# Patient Record
Sex: Female | Born: 1935
Health system: Southern US, Community
[De-identification: ages and names within clinical notes are randomized; demographics above are authoritative.]

## PROBLEM LIST (undated history)

## (undated) DIAGNOSIS — T8859XA Other complications of anesthesia, initial encounter: Secondary | ICD-10-CM

## (undated) DIAGNOSIS — R0600 Dyspnea, unspecified: Secondary | ICD-10-CM

## (undated) DIAGNOSIS — L853 Xerosis cutis: Secondary | ICD-10-CM

## (undated) DIAGNOSIS — Z8489 Family history of other specified conditions: Secondary | ICD-10-CM

## (undated) DIAGNOSIS — M6281 Muscle weakness (generalized): Secondary | ICD-10-CM

## (undated) DIAGNOSIS — M549 Dorsalgia, unspecified: Secondary | ICD-10-CM

## (undated) DIAGNOSIS — Z8719 Personal history of other diseases of the digestive system: Secondary | ICD-10-CM

## (undated) DIAGNOSIS — H6123 Impacted cerumen, bilateral: Secondary | ICD-10-CM

## (undated) DIAGNOSIS — R011 Cardiac murmur, unspecified: Secondary | ICD-10-CM

## (undated) DIAGNOSIS — M1990 Unspecified osteoarthritis, unspecified site: Secondary | ICD-10-CM

## (undated) DIAGNOSIS — K859 Acute pancreatitis without necrosis or infection, unspecified: Secondary | ICD-10-CM

## (undated) DIAGNOSIS — N189 Chronic kidney disease, unspecified: Secondary | ICD-10-CM

## (undated) DIAGNOSIS — N390 Urinary tract infection, site not specified: Secondary | ICD-10-CM

## (undated) DIAGNOSIS — E7800 Pure hypercholesterolemia, unspecified: Secondary | ICD-10-CM

## (undated) DIAGNOSIS — D696 Thrombocytopenia, unspecified: Secondary | ICD-10-CM

## (undated) DIAGNOSIS — I5032 Chronic diastolic (congestive) heart failure: Secondary | ICD-10-CM

## (undated) DIAGNOSIS — T4145XA Adverse effect of unspecified anesthetic, initial encounter: Secondary | ICD-10-CM

## (undated) DIAGNOSIS — I809 Phlebitis and thrombophlebitis of unspecified site: Secondary | ICD-10-CM

## (undated) DIAGNOSIS — E119 Type 2 diabetes mellitus without complications: Secondary | ICD-10-CM

## (undated) DIAGNOSIS — M75101 Unspecified rotator cuff tear or rupture of right shoulder, not specified as traumatic: Secondary | ICD-10-CM

## (undated) DIAGNOSIS — G8929 Other chronic pain: Secondary | ICD-10-CM

## (undated) DIAGNOSIS — H04129 Dry eye syndrome of unspecified lacrimal gland: Secondary | ICD-10-CM

## (undated) DIAGNOSIS — M19019 Primary osteoarthritis, unspecified shoulder: Secondary | ICD-10-CM

## (undated) DIAGNOSIS — I10 Essential (primary) hypertension: Secondary | ICD-10-CM

## (undated) DIAGNOSIS — H9190 Unspecified hearing loss, unspecified ear: Secondary | ICD-10-CM

## (undated) DIAGNOSIS — K219 Gastro-esophageal reflux disease without esophagitis: Secondary | ICD-10-CM

## (undated) DIAGNOSIS — N3281 Overactive bladder: Secondary | ICD-10-CM

## (undated) DIAGNOSIS — M75100 Unspecified rotator cuff tear or rupture of unspecified shoulder, not specified as traumatic: Secondary | ICD-10-CM

## (undated) HISTORY — PX: BACK SURGERY: SHX140

## (undated) HISTORY — PX: CATARACT EXTRACTION W/ INTRAOCULAR LENS  IMPLANT, BILATERAL: SHX1307

## (undated) HISTORY — PX: TOTAL KNEE ARTHROPLASTY: SHX125

## (undated) HISTORY — PX: SHOULDER OPEN ROTATOR CUFF REPAIR: SHX2407

## (undated) HISTORY — PX: JOINT REPLACEMENT: SHX530

## (undated) HISTORY — PX: FIXATION KYPHOPLASTY: SHX860

## (undated) HISTORY — PX: ABDOMINAL HYSTERECTOMY: SHX81

---

## 1997-11-20 ENCOUNTER — Other Ambulatory Visit: Admission: RE | Admit: 1997-11-20 | Discharge: 1997-11-20 | Payer: Self-pay | Admitting: *Deleted

## 1997-12-10 ENCOUNTER — Other Ambulatory Visit: Admission: RE | Admit: 1997-12-10 | Discharge: 1997-12-10 | Payer: Self-pay | Admitting: Internal Medicine

## 1998-10-21 ENCOUNTER — Ambulatory Visit (HOSPITAL_COMMUNITY): Admission: RE | Admit: 1998-10-21 | Discharge: 1998-10-21 | Payer: Self-pay | Admitting: Rheumatology

## 1998-10-21 ENCOUNTER — Encounter: Payer: Self-pay | Admitting: Rheumatology

## 1999-08-25 ENCOUNTER — Encounter: Payer: Self-pay | Admitting: Orthopedic Surgery

## 1999-08-25 ENCOUNTER — Ambulatory Visit (HOSPITAL_COMMUNITY): Admission: RE | Admit: 1999-08-25 | Discharge: 1999-08-25 | Payer: Self-pay | Admitting: Orthopedic Surgery

## 1999-09-08 ENCOUNTER — Ambulatory Visit (HOSPITAL_COMMUNITY): Admission: RE | Admit: 1999-09-08 | Discharge: 1999-09-08 | Payer: Self-pay | Admitting: Orthopedic Surgery

## 1999-09-08 ENCOUNTER — Encounter: Payer: Self-pay | Admitting: Orthopedic Surgery

## 2000-03-12 ENCOUNTER — Encounter (INDEPENDENT_AMBULATORY_CARE_PROVIDER_SITE_OTHER): Payer: Self-pay

## 2000-03-12 ENCOUNTER — Ambulatory Visit (HOSPITAL_COMMUNITY): Admission: RE | Admit: 2000-03-12 | Discharge: 2000-03-12 | Payer: Self-pay | Admitting: Gastroenterology

## 2000-04-26 ENCOUNTER — Encounter: Admission: RE | Admit: 2000-04-26 | Discharge: 2000-04-26 | Payer: Self-pay | Admitting: Orthopedic Surgery

## 2000-04-26 ENCOUNTER — Encounter: Payer: Self-pay | Admitting: Orthopedic Surgery

## 2001-02-20 ENCOUNTER — Encounter: Payer: Self-pay | Admitting: Orthopedic Surgery

## 2001-02-20 ENCOUNTER — Ambulatory Visit (HOSPITAL_COMMUNITY): Admission: RE | Admit: 2001-02-20 | Discharge: 2001-02-20 | Payer: Self-pay | Admitting: Orthopedic Surgery

## 2001-09-10 ENCOUNTER — Emergency Department (HOSPITAL_COMMUNITY): Admission: EM | Admit: 2001-09-10 | Discharge: 2001-09-10 | Payer: Self-pay | Admitting: Emergency Medicine

## 2002-05-16 ENCOUNTER — Encounter: Admission: RE | Admit: 2002-05-16 | Discharge: 2002-05-16 | Payer: Self-pay | Admitting: Orthopaedic Surgery

## 2002-05-16 ENCOUNTER — Encounter: Payer: Self-pay | Admitting: Orthopaedic Surgery

## 2002-06-04 ENCOUNTER — Encounter: Payer: Self-pay | Admitting: Orthopedic Surgery

## 2002-06-09 ENCOUNTER — Encounter: Payer: Self-pay | Admitting: Orthopedic Surgery

## 2002-06-09 ENCOUNTER — Inpatient Hospital Stay (HOSPITAL_COMMUNITY): Admission: RE | Admit: 2002-06-09 | Discharge: 2002-06-12 | Payer: Self-pay | Admitting: Orthopedic Surgery

## 2002-07-22 ENCOUNTER — Encounter: Admission: RE | Admit: 2002-07-22 | Discharge: 2002-10-20 | Payer: Self-pay | Admitting: Orthopedic Surgery

## 2003-03-20 ENCOUNTER — Encounter: Payer: Self-pay | Admitting: Emergency Medicine

## 2003-03-20 ENCOUNTER — Emergency Department (HOSPITAL_COMMUNITY): Admission: EM | Admit: 2003-03-20 | Discharge: 2003-03-20 | Payer: Self-pay | Admitting: Emergency Medicine

## 2003-03-26 ENCOUNTER — Ambulatory Visit (HOSPITAL_COMMUNITY): Admission: RE | Admit: 2003-03-26 | Discharge: 2003-03-26 | Payer: Self-pay | Admitting: Gastroenterology

## 2004-07-14 ENCOUNTER — Encounter: Admission: RE | Admit: 2004-07-14 | Discharge: 2004-07-14 | Payer: Self-pay | Admitting: Family Medicine

## 2005-05-11 ENCOUNTER — Ambulatory Visit: Payer: Self-pay | Admitting: Internal Medicine

## 2005-06-26 ENCOUNTER — Ambulatory Visit: Payer: Self-pay | Admitting: Internal Medicine

## 2005-12-25 ENCOUNTER — Ambulatory Visit: Payer: Self-pay | Admitting: Internal Medicine

## 2006-06-25 ENCOUNTER — Ambulatory Visit: Payer: Self-pay | Admitting: Internal Medicine

## 2006-07-12 ENCOUNTER — Ambulatory Visit: Payer: Self-pay | Admitting: Internal Medicine

## 2006-09-30 ENCOUNTER — Emergency Department (HOSPITAL_COMMUNITY): Admission: EM | Admit: 2006-09-30 | Discharge: 2006-09-30 | Payer: Self-pay | Admitting: Emergency Medicine

## 2006-10-11 ENCOUNTER — Encounter (INDEPENDENT_AMBULATORY_CARE_PROVIDER_SITE_OTHER): Payer: Self-pay | Admitting: *Deleted

## 2006-10-11 ENCOUNTER — Inpatient Hospital Stay (HOSPITAL_COMMUNITY): Admission: RE | Admit: 2006-10-11 | Discharge: 2006-10-15 | Payer: Self-pay | Admitting: Orthopaedic Surgery

## 2006-11-08 ENCOUNTER — Encounter: Admission: RE | Admit: 2006-11-08 | Discharge: 2006-11-08 | Payer: Self-pay | Admitting: Orthopaedic Surgery

## 2006-11-29 ENCOUNTER — Emergency Department (HOSPITAL_COMMUNITY): Admission: EM | Admit: 2006-11-29 | Discharge: 2006-11-29 | Payer: Self-pay | Admitting: Emergency Medicine

## 2006-12-14 ENCOUNTER — Encounter: Admission: RE | Admit: 2006-12-14 | Discharge: 2006-12-14 | Payer: Self-pay | Admitting: Orthopaedic Surgery

## 2006-12-31 ENCOUNTER — Encounter: Admission: RE | Admit: 2006-12-31 | Discharge: 2006-12-31 | Payer: Self-pay | Admitting: Orthopaedic Surgery

## 2007-05-20 ENCOUNTER — Encounter: Admission: RE | Admit: 2007-05-20 | Discharge: 2007-05-20 | Payer: Self-pay | Admitting: Orthopedic Surgery

## 2007-06-14 ENCOUNTER — Ambulatory Visit: Payer: Self-pay | Admitting: Internal Medicine

## 2007-11-12 ENCOUNTER — Telehealth (INDEPENDENT_AMBULATORY_CARE_PROVIDER_SITE_OTHER): Payer: Self-pay | Admitting: *Deleted

## 2007-11-21 ENCOUNTER — Telehealth: Payer: Self-pay | Admitting: Internal Medicine

## 2007-11-26 ENCOUNTER — Telehealth (INDEPENDENT_AMBULATORY_CARE_PROVIDER_SITE_OTHER): Payer: Self-pay | Admitting: *Deleted

## 2008-03-27 ENCOUNTER — Encounter: Admission: RE | Admit: 2008-03-27 | Discharge: 2008-03-27 | Payer: Self-pay | Admitting: Endocrinology

## 2008-07-04 ENCOUNTER — Emergency Department (HOSPITAL_BASED_OUTPATIENT_CLINIC_OR_DEPARTMENT_OTHER): Admission: EM | Admit: 2008-07-04 | Discharge: 2008-07-04 | Payer: Self-pay | Admitting: Emergency Medicine

## 2009-02-04 ENCOUNTER — Emergency Department (HOSPITAL_COMMUNITY): Admission: EM | Admit: 2009-02-04 | Discharge: 2009-02-04 | Payer: Self-pay | Admitting: Emergency Medicine

## 2009-04-13 ENCOUNTER — Inpatient Hospital Stay (HOSPITAL_COMMUNITY): Admission: AD | Admit: 2009-04-13 | Discharge: 2009-04-16 | Payer: Self-pay | Admitting: Orthopedic Surgery

## 2010-02-24 ENCOUNTER — Encounter: Admission: RE | Admit: 2010-02-24 | Discharge: 2010-02-24 | Payer: Self-pay | Admitting: Endocrinology

## 2010-09-03 ENCOUNTER — Encounter: Payer: Self-pay | Admitting: Family Medicine

## 2010-11-18 LAB — GLUCOSE, CAPILLARY
Glucose-Capillary: 112 mg/dL — ABNORMAL HIGH (ref 70–99)
Glucose-Capillary: 123 mg/dL — ABNORMAL HIGH (ref 70–99)
Glucose-Capillary: 153 mg/dL — ABNORMAL HIGH (ref 70–99)
Glucose-Capillary: 158 mg/dL — ABNORMAL HIGH (ref 70–99)
Glucose-Capillary: 161 mg/dL — ABNORMAL HIGH (ref 70–99)
Glucose-Capillary: 172 mg/dL — ABNORMAL HIGH (ref 70–99)
Glucose-Capillary: 175 mg/dL — ABNORMAL HIGH (ref 70–99)
Glucose-Capillary: 90 mg/dL (ref 70–99)

## 2010-11-18 LAB — URINALYSIS, MICROSCOPIC ONLY
Bilirubin Urine: NEGATIVE
Protein, ur: 30 mg/dL — AB
Specific Gravity, Urine: 1.014 (ref 1.005–1.030)
Urobilinogen, UA: 0.2 mg/dL (ref 0.0–1.0)
pH: 6 (ref 5.0–8.0)

## 2010-11-18 LAB — CBC
Hemoglobin: 11.4 g/dL — ABNORMAL LOW (ref 12.0–15.0)
MCHC: 33.2 g/dL (ref 30.0–36.0)
MCV: 90.7 fL (ref 78.0–100.0)
Platelets: 114 10*3/uL — ABNORMAL LOW (ref 150–400)
RBC: 3.8 MIL/uL — ABNORMAL LOW (ref 3.87–5.11)
RDW: 14.8 % (ref 11.5–15.5)
WBC: 8.7 10*3/uL (ref 4.0–10.5)
WBC: 9 10*3/uL (ref 4.0–10.5)

## 2010-11-18 LAB — BASIC METABOLIC PANEL
BUN: 25 mg/dL — ABNORMAL HIGH (ref 6–23)
Calcium: 8.2 mg/dL — ABNORMAL LOW (ref 8.4–10.5)
Calcium: 8.6 mg/dL (ref 8.4–10.5)
Chloride: 102 mEq/L (ref 96–112)
Creatinine, Ser: 1.69 mg/dL — ABNORMAL HIGH (ref 0.4–1.2)
GFR calc Af Amer: >60 mL/min (ref >60)
GFR calc non Af Amer: >60 mL/min (ref >60)
Potassium: 3.8 mEq/L (ref 3.5–5.1)
Sodium: 138 mEq/L (ref 135–145)

## 2010-11-19 LAB — URINE MICROSCOPIC-ADD ON

## 2010-11-19 LAB — BASIC METABOLIC PANEL
CO2: 27 mEq/L (ref 19–32)
Calcium: 9.3 mg/dL (ref 8.4–10.5)
Creatinine, Ser: 0.61 mg/dL (ref 0.4–1.2)
GFR calc Af Amer: >60 mL/min (ref >60)
Glucose, Bld: 158 mg/dL — ABNORMAL HIGH (ref 70–99)

## 2010-11-19 LAB — PROTIME-INR
INR: 1 (ref 0.00–1.49)
Prothrombin Time: 12.9 seconds (ref 11.6–15.2)

## 2010-11-19 LAB — CBC
MCHC: 33.6 g/dL (ref 30.0–36.0)
RDW: 14.4 % (ref 11.5–15.5)

## 2010-11-19 LAB — URINALYSIS, ROUTINE W REFLEX MICROSCOPIC
Glucose, UA: NEGATIVE mg/dL
Specific Gravity, Urine: 1.009 (ref 1.005–1.030)

## 2010-11-21 LAB — URINALYSIS, ROUTINE W REFLEX MICROSCOPIC
Hgb urine dipstick: NEGATIVE
Protein, ur: NEGATIVE mg/dL
Urobilinogen, UA: 0.2 mg/dL (ref 0.0–1.0)

## 2010-11-21 LAB — DIFFERENTIAL
Basophils Absolute: 0 10*3/uL (ref 0.0–0.1)
Basophils Relative: 1 % (ref 0–1)
Monocytes Relative: 9 % (ref 3–12)
Neutro Abs: 5.6 10*3/uL (ref 1.7–7.7)
Neutrophils Relative %: 72 % (ref 43–77)

## 2010-11-21 LAB — POCT CARDIAC MARKERS
CKMB, poc: <1 ng/mL — ABNORMAL LOW (ref 1.0–8.0)
Myoglobin, poc: 141 ng/mL (ref 12–200)

## 2010-11-21 LAB — CBC
MCHC: 33.1 g/dL (ref 30.0–36.0)
Platelets: 190 10*3/uL (ref 150–400)
RBC: 4.55 MIL/uL (ref 3.87–5.11)

## 2010-11-21 LAB — BASIC METABOLIC PANEL
BUN: 9 mg/dL (ref 6–23)
CO2: 30 mEq/L (ref 19–32)
Calcium: 9.7 mg/dL (ref 8.4–10.5)
Creatinine, Ser: 0.55 mg/dL (ref 0.4–1.2)
GFR calc Af Amer: >60 mL/min (ref >60)

## 2010-12-27 NOTE — Discharge Summary (Signed)
Heidi Dominguez, Heidi Dominguez               ACCOUNT NO.:  [id number]   MEDICAL RECORD NO.:  [id number]          PATIENT TYPE:  INP   LOCATION:  5041                         FACILITY:  [facility]   PHYSICIAN:  Almedia Balls. Ranell Patrick, M.D. DATE OF BIRTH:  1921/06/23   DATE OF ADMISSION:  04/13/2009  DATE OF DISCHARGE:                               DISCHARGE SUMMARY   ADMISSION DIAGNOSIS:  Right proximal humerus fracture.   DISCHARGE DIAGNOSES:  1. Right proximal humerus fracture.  2. Status post right shoulder hemiarthroplasty with CT head.   BRIEF HISTORY:  The patient is an 77 year old female who sustained a  ground-level fall on April 12, 2009.  The patient presented to our  office on April 13, 2009, complaining about severe pain to the right  shoulder, inability to use the right upper extremity and intractable  pain.  The patient denied any other injuries; denied any loss of  consciousness, status post this fall.  She also denied any numbness,  tingling, or paresthesias distally to the right upper extremity.   PROCEDURE:  The patient had a right shoulder hemiarthroplasty with CT  head on April 14, 2009.   SURGEON:  Almedia Balls. Ranell Patrick, MD.   ASSISTANT:  Donnie Coffin. Dixon, PA-C   ANESTHESIA:  General anesthesia was used.   COMPLICATIONS:  None.   HOSPITAL COURSE:  The patient was admitted on April 13, 2009, for  preoperative labs and pain management for the upcoming procedure, which  was performed on April 14, 2009.  The patient tolerated the procedure  well.  After adequate time in the postanesthesia care unit, she was  transferred up to 5000.   On postop day #1, the patient states she is doing quite well with  minimal soreness to that right shoulder.  Neurologically, she is intact.  Sling was in place and she was able to work with some physical therapy  over the next several days.  It was discussed with her and her family  that we would recommend skilled nursing facility placement for  her to  better assess her and to take care of her needs.  Family was in  agreement with this plan, thus did work with skilled nursing then for  placement on April 20, 2009.   CONDITION:  Stable.   DIET:  Carb-modified.   ALLERGIES:  The patient has allergies to:  1. SULFA.  2. FELDENE.   DISCHARGE MEDICATIONS:  1. Norvasc 10 mg daily.  2. Lotensin 20 mg daily.  3. Amaryl 1 mg p.o. daily.  4. Insulin 1-20 units subcu t.i.d. with food.  5. Glucophage 1000 mg daily.  6. Robaxin 500 mg p.o. q.6 h.  7. Ditropan 5 mg t.i.d.  8. Zocor 40 mg daily.  9. Januvia 100 mg daily.  10.Robaxin 500 mg p.o. q.6 h.  11.Percocet 5/325 1-2 tablets q.4-6 h. p.r.n. pain.   FOLLOWUP:  The patient is to follow back with Dr. Malon Kindle in 2  weeks for x-rays and a recheck in the right shoulder.      Thomas B. Durwin Nora, P.A.      Viviann Spare  Russ Halo, M.D.  Electronically Signed    TBD/MEDQ  D:  04/15/2009  T:  04/16/2009  Job:  [id number]

## 2010-12-27 NOTE — Op Note (Signed)
NAMEJODEEN, MCLIN               ACCOUNT NO.:  [id number]   MEDICAL RECORD NO.:  [id number]          PATIENT TYPE:  INP   LOCATION:  5041                         FACILITY:  [facility]   PHYSICIAN:  Almedia Balls. Ranell Patrick, M.D. DATE OF BIRTH:  1921/05/19   DATE OF PROCEDURE:  04/14/2009  DATE OF DISCHARGE:                               OPERATIVE REPORT   PREOPERATIVE DIAGNOSIS:  Right shoulder proximal humerus fracture in the  setting of rotator cuff tear arthropathy.   POSTOPERATIVE DIAGNOSIS:  Right shoulder proximal humerus fracture in  the setting of rotator cuff tear arthropathy.   PROCEDURE PERFORMED:  Right shoulder hemiarthroplasty using global FX  long stem prosthesis with CTA head as well as glenoid plasty and loose  body removal, right shoulder.   SURGEON:  Almedia Balls. Ranell Patrick, MD   ASSISTANT:  Donnie Coffin. Dixon, PA-C   ANESTHESIA:  General anesthesia was used.   ESTIMATED BLOOD LOSS:  150 mL.   URINE OUTPUT:  500 mL.   FLUID REPLACEMENT:  1200 mL crystalloid.   INSTRUMENT COUNT:  Correct.   COMPLICATIONS:  There were no complications.   Preoperative antibiotics were given.   INDICATIONS:  The patient is an 77 year old female who suffered a fall  injuring her right shoulder.  The patient has a displaced proximal  humerus fracture and significant pain.  The patient also had a disabling  shoulder pain from arthritis prior to her fall.  She was being managed  with serial injections in the clinical setting, but was on narcotics and  was really struggling with pain prior to this fracture.  After  counseling the patient and the patient's family and having a thorough  discussion of all treatment options available, including nonoperative  options versus potential surgical options including simple ORIF of her  humerus fracture versus potential arthroplasty, the patient's family and  the patient elected to proceed with arthroplasty.  Informed consent was  obtained.   DESCRIPTION  OF PROCEDURE:  After an adequate level of anesthesia was  achieved, the patient was positioned in the modified beach chair  position.  Right shoulder was sterilely prepped and draped in usual  manner.  Deltopectoral approach utilized starting at coracoid process  extending down to the anterior humerus.  The cephalic vein identified,  taken laterally with deltoid.  The pectoralis was taken medially and the  upper centimeter released.  The conjoined tendon was identified and  taken medially.  Biceps tendon was identified.  The patient's humerus  was fractured in the surgical neck area.  The patient did not have a  rotator cuff and the superior dome of the humeral head was actually  almost plastered up to her acromion.  We then osteotomized off the  lesser tuberosity with the subscapularis, which remarkably looked  wonderful.  She did have quite a bit of synovitis and capsular  hypertrophy behind the subscapularis.  We released that off the subscap,  so it was freed up from the anterior neck of the scapula.  We removed  the entire capsule, which was inflamed.  At this point given there was  no rotator cuff detachment at all, went ahead and removed that proximal  bone knowing that we would be doing an arthroplasty, but wanting to keep  options open between both reverse shoulder prosthesis as well as  compression arthroplasty.  The patient had nearly acetabularized the  glenoid with huge amount of buildup of anterior bone as well as inferior  bone so that her superior inclination of her glenoid phase was about 22  degrees.  This created specifically a problem for the reverse  prosthesis, so we selected a 4-mm oval bur and burred down the  osteophyte and acetabularization anteriorly and inferiorly.  We were  able to get that glenoid phase about neutral, I was then able to put in  a pin for the reverse shoulder prosthesis central on the glenoid using  the inferior glenoid and scapular pillar and  inferior scapular neck is  my guide for where we were anterior-posterior and then also the  supraglenoid tubercle was the superior landmark and then my finger  anteriorly along the scapular neck.  We placed our guide pin, we started  reaming for the metaglene and just as we were able to get some  concentric reaming, obviously the reaming was fairly deep inferiorly to  try to get reoriented at least perpendicular orientation, but just as we  were beginning to engage the reamer and getting a concentric reduction  superiorly, I noticed that the posterior bone had become very very thin  and white and essentially began to fracture and I believe that was due  significant posterior erosion where from the patient with years and  years arthritis and so she basically thinned out posteriorly  significantly so as we began to actually get down to where we had, we  were able to see the metaglene, we lost about a third of the posterior  bone and that may be concerned about not getting a screw posteriorly,  but also not having a posterior rim on which to support the metaglene  and I was concerned about this potential loosening and causing her to  have a second operation.  At this point, we decided that we were very,  very happy with reorientation of the glenoid and the glenoid shape was  basically close to the inverted tear shape that we would expect to see  in a native glenoid.  It felt like this was basically a reaming and  running with the ability to perform some fibrocartilage over this and at  this point we elected to proceed with a CTA arthroplasty with a long  stem global FX stem.  We went ahead and reamed on our humeral side up to  a ten, placed a trial stem in place and then used our best gas at length  and basically the shoulder reduced in an appropriate version referencing  off her bicipital groove.  We felt that 7 laser marks gave [place] good soft  tissue balance and tension and we marked our  version.  we went ahead and  removed our trial components, thoroughly irrigated the canal, and dried  the canal with a 4 x 18 sponge and then cemented using a cement gun and  pressurization, but no restricter as we were following fairly close to  the distal humerus and the condylar flair.  We went ahead and injected  cement, DePuy one, and then went ahead cemented our component into place  with 7 laser mark.  We placed #2 FiberWire around the biceps groove for  repair of the lesser tuberosity and we had that stitch in place before  we placed our stem.  Once we had our stem in place and allowed the  cement to harden, we then placed an round-the-world stitch through the  median fin and then impacted our 4423 CTA head in place, which would  give [place] some decent offset and tensioning for the deltoid and this was  very stable as we reduced it.  There was no tendency for it to dislocate  CA ligament and coracoacromial arch was intact.  At this point, we went  ahead and repaired the subscap and lesser tuberosity back to the  appropriate position.  Again there was not lot of bone, so we bone  grafted underneath it and secured to basically the stem and also we used  the suture through the drill holes in the humerus over the top of the  lesser tuberosity and on the subscapularis to further hold that down  plus the round-the-world stitch to hold it down to the stem and we are  hoping to get some tuberosity to bone healing so that she may have some  subscapularis function, I am not sure whether that will happen, but we  are hoping for that.  Again she had no rotator cuff whatsoever, but we  did feel like we had good replacement of at least the bony anatomy and  her humerus with prosthesis hoping for some soft tissue attachment tear  with lesser tuberosity and subscap and then anticipating a fairly  lengthy recovery and quite a bit of limitation in her function.  We did  have a stable construct of  her  shoulder and then we thoroughly irrigated, closed the deltopectoral  interval with 0-Vicryl suture interrupted simple sutures followed by 2-0  Vicryl for subcutaneous closure and 4-0 Monocryl for skin.  Steri-Strips  and sterile compressive bandage were applied followed by a shoulder  sling.  The patient tolerated surgery well.      Almedia Balls. Ranell Patrick, M.D.  Electronically Signed     SRN/MEDQ  D:  04/14/2009  T:  04/15/2009  Job:  [id number]

## 2010-12-27 NOTE — Assessment & Plan Note (Signed)
[facility]                             PULMONARY OFFICE NOTE   NAME:Heidi Dominguez, Heidi Dominguez                      MRN:          [id number]  DATE:06/14/2007                            DOB:          05-10-1921    PROBLEM LIST:  1. Asthma.  2. Allergic rhinitis.  3. Allergic conjunctivitis.  4. Obstructive sleep apnea (mild).  5. Diabetes.   HISTORY:  One-year follow-up.  Two weeks of sore throat, dry nose, had  upper teeth pulled a month ago.  Throat is irritated.  She has noted  some tingling around her mouth.  No cough, wheeze, or chest tightness.  No fever.  Nothing purulent.   MEDICATIONS:  1. Januvia 100 mg.  2. Labetalol 100 mg.  3. Metformin XR 500 mg.  4. Amlodipine/Bena 10/20.  5. Glimepiride 2 mg.  6. Oxybutynin 5 mg.  7. Ranitidine 150 mg.  8. Lipitor 20 mg.  9. Lyrica 150 mg.  10.Alendronate 10 mg.  11.B12 injection.  12.Vitamin D.   ALLERGIES:  SULFA with rash.   OBJECTIVE:  VITAL SIGNS:  Weight 197 pounds, blood pressure 132/64,  pulse 69, room air saturation 96%.  NECK:  Neck veins are distended.  HEENT:  Dentures.  Pharynx is red.  No visible postnasal drainage.  CHEST:  Quiet, breathing unlabored.  HEART:  Sounds regular without murmur.   IMPRESSION:  Rhinitis, pharyngitis, minimal asthma, insignificant sleep  apnea.  Her main complaint actually is dry nose.  We discussed the sore  throat noting that she has had flu vaccine and had Pneumococcal vaccine  in 2007.  I do not see evidence of anything that we need to treat, but  she is cautioned to watch for reflux symptoms.   PLAN:  Nasal saline gel.  Schedule return in one year, but earlier  p.r.n.     Clinton D. Maple Hudson, MD, Tonny Bollman, FACP  Electronically Signed    CDY/MedQ  DD: 06/16/2007  DT: 06/17/2007  Job #: [id number]   cc:   Reather Littler, M.D.

## 2010-12-30 NOTE — Assessment & Plan Note (Signed)
[facility]                               PULMONARY OFFICE NOTE   NAME:Heidi Dominguez, Heidi Dominguez                      MRN:          [id number]  DATE:06/25/2006                            DOB:          1920-09-28    PROBLEM LIST:  1. Asthma.  2. Allergic rhinitis.  3. Allergic conjunctivitis.  4. Obstructive sleep apnea (nine per hour).  5. Diabetes.   HISTORY:  She returns for scheduled follow up saying that her nose and mouth  feel dry unless she uses Astelin.  I explained this is a drying medication  but she says she likes it.  Postnasal drip episodes seem to dump stuff down  her throat rather abruptly and wake her.  When pressured, she could not  really be sure this was not reflux.  She does not bring up anything really  purulent.  She has had no chest problems, wheezing or tightness.   MEDICATIONS:  Prandin, Labetalol, Lipitor, Avandia, Lotrel, metformin, iron,  aspirin, Enablex, Astelin.   DRUG INTOLERANCE:  SULFA with rash.   OBJECTIVE:  Weight 211 pounds.  Blood pressure 146/70, pulse regular 67,  room air saturation 97%.  Obese and she has some difficulty standing up out  of a chair.  Wet nose.  A few scattered crackles and shallow respiratory  noise without cough or real wheeze.  Heart sound regular with no murmur.   IMPRESSION:  Allergic conjunctivitis and rhinitis:  I am not sure how much  asthma she has.   PLAN:  Environmental precautions.  We once again talked around the issue of  trying allergy skin testing.  She is going to try off and on Astelin to see  what that does for her.  We are getting a chest x-ray and spirometry.  She  is given another sample of Astelin and scheduled to return to me in six  months but earlier p.r.n.     Clinton D. Maple Hudson, MD, Tonny Bollman, FACP  Electronically Signed    CDY/MedQ  DD: 06/25/2006  DT: 06/25/2006  Job #: [id number]   cc:   Reather Littler, M.D.

## 2010-12-30 NOTE — H&P (Signed)
NAME:  Heidi Dominguez, Heidi Dominguez                         ACCOUNT NO.:  [id number]   MEDICAL RECORD NO.:  [id number]                   PATIENT TYPE:   LOCATION:                                       FACILITY:  [facility]   PHYSICIAN:  Almedia Balls. Ranell Patrick, M.D.              DATE OF BIRTH:  1921-07-12   DATE OF ADMISSION:  06/09/2002  DATE OF DISCHARGE:                                HISTORY & PHYSICAL   CHIEF COMPLAINT:  Left shoulder pain.   HISTORY OF PRESENT ILLNESS:  The patient is a 77 year-old female with a long-  standing history of chronic rotator cuff tear which was not treated  surgically.  The patient has complained of flexion problems and pain since  that time.  She now states that her shoulder has been killing her.  She  complains of constant pain which is worse with any type of activity. All  conservative treatments have failed and the patient wishes to proceed with  surgery.  The risks and benefits of surgery have been discussed with the  patient and the patient wishes to proceed.   PAST MEDICAL HISTORY:  1. Hypertension.  2. Hypercholesterolemia.  3. L1-L2 spinal stenosis.  4. Diabetes mellitus.   PAST SURGICAL HISTORY:  1. Bilateral total knee arthroplasties.  2. Hysterectomy.   MEDICATIONS:  1. Prandin 2 mg one before breakfast, two before lunch and two before     supper.  2. Avandia 2 mg one p.o. q.d.  3. Metformin 1000 mg one p.o. b.i.d.  4. Norvasc 10 mg one p.o. q.d.  5. Lipitor 40 mg one p.o. q.d.  6. Potassium chloride 10 mEq one p.o. q.d.  7. Labetalol 300 mg one p.o. b.i.d.  8. Indapamide 1.25 mg one p.o. q.d.  9. Celebrex 200 mg.  10.      Aspirin which she has stopped.   ALLERGIES:  No known drug allergies.   SOCIAL HISTORY:  The patient denies any tobacco or alcohol use.  She is  retired.  She is married and lives in a house with one story. Her husband  will be her caregiver after surgery.   FAMILY HISTORY:  Mother deceased and had hypertension and  diabetes mellitus.  Father deceased with hypertension.   REVIEW OF SYMPTOMS:  GENERAL:  She denies fevers, chills, night sweats,  bleeding tendencies.  CNS:  She denies blurry or double vision.  She denies  headache or paralysis.  RESPIRATORY:  She denies shortness of breath,  productive cough or hemoptysis.  CARDIOVASCULAR:  She denies chest pain,  angina or orthopnea.  GI:  She denies nausea, vomiting, diarrhea,  constipation, melena or bloody stools.  GU:  She denies dysuria, hematuria  or discharge.  MUSCULOSKELETAL:  She is complaining of right sided finger  numbness.   PHYSICAL EXAMINATION:  VITAL SIGNS:  Blood pressure is 156/77, pulse 64,  respirations 16.  GENERAL:  Well  developed, well nourished 77 year-old female.  HEENT:  Normocephalic, atraumatic.  Pupils equal, round and reactive to  light.  The neck is supple.  No carotid bruit is noted.  CHEST:  Clear to auscultation bilaterally.  No wheezes or crackles.  HEART:  Regular rate and rhythm.  No murmurs, rubs or gallops.  ABDOMEN:  Soft, non-tender and non-distended.  Positive bowel sounds times  four.  EXTREMITIES:  She has extremely limited range of motion of her left shoulder  with abduction to 30 degrees, forward flexion to 40 degrees and almost no  internal or external rotation. She has severe pain with any range of motion.  She is tender over the entire shoulder joint and has mild to moderate  swelling.  She is neurologically intact in her left upper extremity.  SKIN:  No rashes or lesions.   LABORATORY AND ACCESSORY DATA:  X-rays reveal advanced cuff tear arthropathy  of the left shoulder.   IMPRESSION:  1. Left posterior arthropathy.  2. Hypertension.  3. Hypercholesterolemia.  4. L1-L2 spinal stenosis.  5. Diabetes mellitus.   PLAN:  The patient will be admitted to [facility] on June 09, 2002 and undergo left shoulder hemiarthroplasty by Dr. Malon Kindle.     Clarene Reamer, P.A.-C.                    Almedia Balls. Ranell Patrick, M.D.    SW/MEDQ  D:  06/02/2002  T:  06/02/2002  Job:  [id number]

## 2010-12-30 NOTE — Op Note (Signed)
   NAME:  Heidi Dominguez, Heidi Dominguez                         ACCOUNT NO.:  [id number]   MEDICAL RECORD NO.:  [id number]                   PATIENT TYPE:  AMB   LOCATION:  ENDO                                 FACILITY:  [facility]   PHYSICIAN:  John C. Madilyn Fireman, M.D.                 DATE OF BIRTH:  1921/05/06   DATE OF PROCEDURE:  03/26/2003  DATE OF DISCHARGE:                                 OPERATIVE REPORT   PROCEDURE:  Colonoscopy.   INDICATION FOR PROCEDURE:  The patient is a history of adenomatous colon  polyps with last colonoscopy three years ago.  Recently, she has been  complaining of right lower quadrant pain and diarrhea which has been  persistent, and we decided to pursue colonoscopy for work-up as well as  surveillance of her prior colon polyps.   DESCRIPTION OF PROCEDURE:  The patient was placed in the left lateral  decubitus position and placed on the pulse monitor with continuous low-flow  oxygen delivered by nasal cannula.  She was sedated with 100 mcg IV fentanyl  and 10 mg IV Versed.  The Olympus video colonoscope was inserted into the  rectum and advanced to the cecum, confirmed by transillumination at  McBurney's point and visualization of the ileocecal valve and appendiceal  orifice.  The prep was excellent.  The cecum, ascending, transverse,  descending, and sigmoid colon all appeared normal with no masses, polyps,  diverticula, or other mucosal abnormalities.  The rectum likewise appeared  normal, and retroflexed view of the anus revealed no obvious internal  hemorrhoids.  The colonoscope was then withdrawn and the patient returned to  the recovery room in stable condition.  She tolerated the procedure well,  and there were no immediate complications.   IMPRESSION:  Basically normal colonoscopy.   PLAN:  Repeat study in five years.                                               John C. Madilyn Fireman, M.D.    JCH/MEDQ  D:  03/26/2003  T:  03/26/2003  Job:  [id number]   cc:   Reather Littler, M.D.  [address], [address]  [place]  [place] [address]  Fax: [phone]

## 2010-12-30 NOTE — Discharge Summary (Signed)
NAME:  Heidi Dominguez, Heidi Dominguez                         ACCOUNT NO.:  [id number]   MEDICAL RECORD NO.:  [id number]                   PATIENT TYPE:  INP   LOCATION:  5003                                 FACILITY:  [facility]   PHYSICIAN:  Almedia Balls. Ranell Patrick, M.D.              DATE OF BIRTH:  1921-02-10   DATE OF ADMISSION:  06/09/2002  DATE OF DISCHARGE:  06/12/2002                                 DISCHARGE SUMMARY   ADMISSION DIAGNOSES:  1. Left posterior arthropathy.  2. Hypertension.  3. Hypercholesterolemia.  4. L1-L2 spinal stenosis.  5. Diabetes.   DISCHARGE DIAGNOSES:  1. Left posterior arthropathy status post left shoulder hemiarthroplasty.  2. Hypertension.  3. Hypercholesterolemia.  4. L1-L2 spinal stenosis.  5. Diabetes.   PROCEDURES:  The patient was taken to the OR on June 09, 2002, and  underwent left shoulder hemiarthroplasty with surgeons Dr. Malon Kindle.  Assistant was [employer], P.A.-C.  Surgery was done under general  anesthesia.   CONSULTATIONS:  The patient's family physician, Dr. _______.   BRIEF HISTORY:  The patient is a 77 year old female with longstanding  history of chronic rotator cuff tear, which was not treated surgically.  The  patient has complained of flexion problems and pain since that time.  She  now states that her shoulder has been increasingly getting worse and is  debilitating.  She complains of constant pain, which is worse with any type  of activity.  All conservative treatments have failed, and the patient  wishes to proceed with surgical intervention.  The patient was admitted to  the hospital.   LABORATORY DATA:  Admission labs showed a white blood cell count of 7.6,  hemoglobin 12.0, hematocrit of 37.4.  Serial H&H's were followed throughout  the hospital course.  The hemoglobins are declined to levels of 9.1,  hematocrit of 27.3.  The differential on the admission CBC was all within  normal limits.  PT and INR at time of  admission:  PT 12.6, INR 0.9, PTT 28.  Routine chemistries done at time of admission were all within normal limits.  At time of discharge, the patient did have slightly elevated glucose level  at 228.  Urinalysis at time of admission was negative.  Patient's blood type  was O positive.  EKG at time of admission shows normal sinus rhythm with  moderate voltage criteria for LVH, and would be a normal variant.  Unable to  find admission chest x-ray results.   HOSPITAL COURSE:  The patient was admitted to [facility] and  underwent the above-stated procedure without complication.  The patient  tolerated the procedure well and was allowed to return to the recovery room,  and then to the orthopedic floor, to continue postoperative care.  The  patient was placed on P.C. analgesics for pain control following the  surgery.  This was discontinued on postoperative day #1.  The patient was  placed on p.o. medications only, and this seemed to control her pain well.  Throughout the hospital course, the patient's motor and neurovascular exam  remained intact.  Serial H&H's were followed.  The patient did have a slight  drop in this.  PT/OT was ordered for general pendulum exercises.  Throughout  the hospital course, the patient did have a slight temp, with T-max of  100.8.  Discharge was planned for postoperative day #2.  The patient had a  near-syncopal episode, where she complained of weakness.  The family was  concerned about taking the patient home at this point.  She was allowed to  stay 1 more day, where serial H&H's were monitored.  Also, her family  physician was consulted, questioning the issue of drop in blood pressure and  changing blood pressure medications.  On postoperative day #4, the patient  was doing well following her near-syncopal event with orthostasis.  She was  alert and oriented.  Dr. ________ had seen the patient earlier in the day  and felt that she was stable for  discharge.  Her pain was well-controlled on  p.o. pain meds.  Her hemoglobin at the time of discharge was 9.1, hematocrit  27.3.  The patient's incision remained clean and dry without signs or  symptoms of infection.  Home Health PT and OT was arranged for the patient  with a Home Health aide.  The patient was discharged home on June 12, 2002, with Home Health PT, OT, and aide.   DISCHARGE MEDICATIONS:  1. Vicodin p.r.n. pain.  2. Robaxin 500 mg p.r.n. spasm.  3. Trinsicon x3 weeks.   The patient was to resume all home medications per her primary care  physician.   DIET:  As tolerated.   ACTIVITY:  The patient should wear the adjustable sling.  She can work on  range of motion for her wrist and elbow and range of motion exercises for  the left upper extremity.   FOLLOW UP:  The patient will follow up with Dr. Ranell Patrick in 10 to 14 days.  She is to call the office for her appointment.   CONDITION ON DISCHARGE:  Stable.     Roma Schanz, P.A.                   Almedia Balls. Ranell Patrick, M.D.    CS/MEDQ  D:  07/25/2002  T:  07/26/2002  Job:  [id number]

## 2010-12-30 NOTE — Op Note (Signed)
NAME:  Heidi Dominguez, Heidi Dominguez                         ACCOUNT NO.:  [id number]   MEDICAL RECORD NO.:  [id number]                   PATIENT TYPE:  INP   LOCATION:  2890                                 FACILITY:  [facility]   PHYSICIAN:  Almedia Balls. Ranell Patrick, M.D.              DATE OF BIRTH:  08/09/1921   DATE OF PROCEDURE:  06/09/2002  DATE OF DISCHARGE:                                 OPERATIVE REPORT   PREOPERATIVE DIAGNOSIS:  Severe left shoulder pain with rotator cuff tear  arthropathy.   POSTOPERATIVE DIAGNOSIS:  Severe left shoulder pain with rotator cuff tear  arthropathy.   OPERATION:  Left shoulder hemiarthroplasty with CTA head prosthesis by  Depuy.   SURGEON:  Almedia Balls. Ranell Patrick, M.D.   FIRST ASSISTANT:  Lianne Cure, PA   ANESTHESIA:  General   ESTIMATED BLOOD LOSS:  Minimal   FLUIDS REPLACED:  1400 cc of crystalloid.   INSTRUMENT COUNT:  Correct   COMPLICATIONS:  None   ANTIBIOTICS:  Perioperative antibiotics were given.   INDICATIONS FOR PROCEDURE:  The patient is a 77 year old who present  complaining of disabling left shoulder pain which has been progressive over  the last several years.  There was no one specific traumatic episode  responsible.  Preoperative radiographic workup demonstrates evidence of cuff  tear arthropathy.  The patient has clearly no rotator cuff present and very  poor shoulder function on physical examination.  The patient has failed  conservative management with injections and after discussing options to  include surgery with the patient, they elected to proceed with surgery.   DESCRIPTION OF PROCEDURE:  After an adequate level of anesthesia was  achieved, the patient was positioned in the beach chair position.  All  neurovascular structures were padded appropriately.  The left shoulder was  examined under anesthesia.  She was noted to have forward flexion of  approximately 70 degrees, abduction of 50 degrees, external rotation was  limited  to 10 degrees, and internal rotation 20 degrees.  After completion  of exam under anesthesia, the left shoulder and arm were prepped and draped  in their entirety in the usual sterile fashion.  After sterile prep and  drape, the skin was infiltrated in the deltopectoral interval with 0.5%  Marcaine with epinephrine.  Incision was created using a 10 blade scalpel.  Dissection was carried sharply down through the subcutaneous tissues using  Metzenbaum scissors.  The cephalic vein was identified and taken laterally  with the deltoid muscle.  The deltopectorial interval was developed and  clavicle pectoral fascia was incised.  The conjoined tendon was mobilized  and taken medially.  The pectoralis was released, approximately 1 cm of it  proximally near the humerus to facilitate exposure.  The patient's  subscapularis was identified.  This was then taken down just lateral to the  bicipital groove and separated from the capsule.  Retraction sutures were  placed in this.  Self retaining retractor was placed under the deltoid and  under the subscapularis facilitating exposure to the joint capsule.  Anterior capsulectomy was performed exposing an extremely arthritic  glenohumeral joint.  Utilizing a bone hook a posterior capsule release was  performed facilitating adequate exposure of the humerus.  Utilizing a  template a humeral osteotomy was performed utilizing an oscillating saw.  The patient's biceps were noted to be intact and initially this was  preserved.  However, later in the case, it was actually removed due to  interference with the articulation of the CTA head and the acromion.  The  patient's CA ligament was noted to be intact and robust.  This was left  alone.  The undersurface of the acromion appeared to be smooth.  At this  humeral preparation was performed such that a size 10 Depuy global stem  could be press fit.  Attention was directed towards appropriate humeral  version with the  humeral component retroverted approximately 30 degrees.  At  this point, a trial reduction was performed utilizing a 52 head.  Excellent  soft tissue tension and balance was noted.  Next, the jig was placed on a  trial stem to facilitate resection of the greater tuberosity.  This was then  performed and during this resection, the cephalic vein was traumatized with  the saw blade due to excessive excursion on the saw.  The cephalic vein was  then tied off utilizing Vicryl suture.  Once the resection of the tuberosity  was performed, the CTA head prosthesis was placed.  The shoulder was  rereduced and the soft tissue balance was noted to be excellent.  The  remainder of osteophytes were removed from the humerus as well as from the  glenoid neck.  At this point, the humeral trial prosthesis were removed.  Exposure of the glenoid was performed.  There was noted to be some rough  deposits located on the glenoid surface which were smoothed out using a  glenoid reamer very gently.  There was noted to be a nice concentric glenoid  socket.  Again inferior osteophytes were removed and at this point, the real  components were opened.  This was a size 10 stem which was placed in a press  fit manner.  Once the press fit prosthesis was placed, the 52 x 18 CTA head  was impacted into the taper.  Shoulder was reduced.  There was noted to be  excellent soft tissue tension and range of motion with no impingement.  At  this point, the shoulder was thoroughly irrigated.  The subscapularis was  reattached through drill holes which had been placed prior to placing the  prosthesis utilizing fiberwire suture times three in a modified [employer]  technique.  Excellent subscapularis repair was achieved.  After  subscapularis repair, the shoulder could be externally rotated with the arm  at her side 45 degrees, which was a dramatic improvement over preoperative. The deltopectorial interval was closed using Vicryl  loosely and then the  subcutaneous was closed using 2-0 Vicryl and the skin with a running 4-0  Monocryl.  The patient tolerated the surgery well and was taken to the  recovery room in a shoulder immobilizer.                                                Viviann Spare  Russ Halo, M.D.    SRN/MEDQ  D:  06/11/2002  T:  06/11/2002  Job:  [id number]

## 2010-12-30 NOTE — Op Note (Signed)
Heidi Dominguez, Heidi Dominguez               ACCOUNT NO.:  [id number]   MEDICAL RECORD NO.:  [id number]          PATIENT TYPE:  INP   LOCATION:  3019                         FACILITY:  [facility]   PHYSICIAN:  Sharolyn Douglas, M.D.        DATE OF BIRTH:  13-Feb-1921   DATE OF PROCEDURE:  10/11/2006  DATE OF DISCHARGE:                               OPERATIVE REPORT   DIAGNOSIS:  L3 compression fracture.   PROCEDURE:  1. L3 kyphoplasty.  2. Fluoroscopic imaging used for kyphoplasty L3.   SURGEON:  Sharolyn Douglas, M.D.   ASSISTANT:  None.   ANESTHESIA:  General endotracheal.   COMPLICATIONS:  None.   INDICATION:  The patient is a pleasant 77 year old female with severe  chronic back pain which has acutely worsened.  Her imaging studies show  acute end plate fracture at L3, chronic degenerative changes and old  fractures at L1-2.  She now presents for kyphoplasty in hopes of  improving her symptoms.  Risks, benefits and alternatives were reviewed.   PROCEDURE IN DETAIL:  After informed consent, she was taken to the  operating room.  She underwent general endotracheal anesthesia without  difficulty and prophylactic antibiotics.  She was carefully turned prone  onto the [place] frame, bolsters placed under her chest and pelvis.  The  back was prepped and draped in the usual sterile fashion.  Biplanar  fluoroscopy was brought into the field.  True AP and lateral images of  the L3 vertebral body were obtained. A small incision was made just at  the lateral aspect of the left L3 pedicle.  She Jamshidi needle used to  cannulate the pedicle using biplanar fluoroscopy.  A guide wire was  placed.  Working cannula established into the vertebral body.  Intervertebral bone tamp placed, inflated to 6 mL.  Bone quality was  soft.  The pressure remained less than 100 PSI.  We then pushed 5 mL of  partially hardened methylmethacrylate cement into the vertebral body.  This was done using live biplanar fluoroscopy.  We  had good cement fill.  The working cannula was removed.  A biopsy had been sent to pathology.  The small stab incision was closed with a nylon suture.  Sterile  dressing applied. The patient was turned supine, extubated without  difficulty and transferred to recovery in stable condition,  neurologically intact.      Sharolyn Douglas, M.D.  Electronically Signed    MC/MEDQ  D:  10/12/2006  T:  10/12/2006  Job:  [id number]

## 2010-12-30 NOTE — Discharge Summary (Signed)
NAMESHAWNY, BORKOWSKI               ACCOUNT NO.:  [id number]   MEDICAL RECORD NO.:  [id number]          PATIENT TYPE:  INP   LOCATION:  3019                         FACILITY:  [facility]   PHYSICIAN:  Sharolyn Douglas, M.D.        DATE OF BIRTH:  September 14, 1920   DATE OF ADMISSION:  10/11/2006  DATE OF DISCHARGE:  10/13/2006                               DISCHARGE SUMMARY   ADMISSION DIAGNOSES:  1. L1 compression fracture.  2. Diabetes.  3. Hypertension.  4. Hypercholesterolemia.   DISCHARGE DIAGNOSES:  1. Status post L3 kyphoplasty.  2. L1 compression fracture.  3. Diabetes.  4. Hypertension.  5. Hypercholesterolemia.   PROCEDURE:  On October 11, 2006, the patient was taken to the operating  room for a L3 kyphoplasty. Surgery was done by Dr. Noel Gerold with no  assistants. General anesthesia was used.   CONSULTS:  None.   LABORATORY DATA:  On February 27, CBC was within normal limits with the  exception of RDW 14.7 which was slightly elevated. Complete metabolic  panel was normal with the exception of a glucose of 193. PT, INR and PTT  preoperatively were normal. Blood typing was O positive. UA from  February 28 was positive for moderate leukocytes, also for nitrites,  small amount of bilirubin and 15 ketones, cloudy appearance, many  bacteria was seen.   X-RAYS:  C-arm was used intraoperatively for the kyphoplasty. Chest x-  ray from February 27 showed cardiomegaly without CHF.   EKG:  Not seen on the chart.   BRIEF HISTORY:  The patient is an 77 year old female who came to our  office a couple of weeks ago with severe pain in her left lower  extremity. She was also having pain in her back. She had a stat MRI  ordered, and she was found to have a L3 compression fracture. Dr. Noel Gerold  discussed L3 kyphoplasty procedure with the patient and her family. They  indicated understanding and opted to proceed.   HOSPITAL COURSE:  On October 11, 2006, the patient was admitted to the  hospital  and taken to the operating room for the above procedure. She  tolerated the procedure well without any intraoperative complications.  She was transferred to the recovery room in stable condition.   Postoperatively, routine orthopedic spine protocol was followed, and she  progressed along well. Unfortunately, she continues to have her left  thigh pain that she was having preoperatively and is limited secondary  to this. She does have some issues at home with a sick husband and  limited family assistance. Physical therapy thought she may benefit from  skilled nursing facility stay. I agree with that secondary to her  limited mobility as well as limited assistance from her family. Her  husband is also sick and cannot help her. He actually requires care as  well.   DISCHARGE PLAN:  The patient is an 77 year old female status post L3  kyphoplasty with continued left lower extremity pain.   MEDICATIONS:  1. Avandia 4 mg daily.  2. Metformin XR 500 mg 1 in the morning, 2 at lunch  and 1 in the      evening.  3. Januvia 100 mg daily.  4. Labetalol 300 mg daily.  5. Lotrel __________ mg daily.  6. Hydrochlorothiazide 25 mg daily.  7. Lipitor 80 mg daily.  8. Vitamin B12 daily.  9. Darvocet as needed for pain.   FOLLOW UP:  With Dr. Noel Gerold postoperatively. At 2 weeks, she will need  stitch removed from her surgical site. This can be done at the nursing  home. She should follow up with [place] in 4 to 6 weeks postoperatively. If  the stitch cannot be removed, certainly she can come to our office for  that followup appointment at 2 weeks.   DIET:  Recommend a carbohydrate-modified diet.   CONDITION ON DISCHARGE:  Stable and improved.   ACTIVITY:  Daily ambulation program. The patient may need a walker. The  dressing is removed. Apply Band-Aids only as needed for any drainage.  Avoid any lifting. Daily ambulation program. No brace needed.   DISPOSITION:  The patient being discharged from [facility] to a  skilled nursing facility.      Verlin Fester, P.A.      Sharolyn Douglas, M.D.  Electronically Signed    CM/MEDQ  D:  10/13/2006  T:  10/13/2006  Job:  [id number]

## 2011-06-04 ENCOUNTER — Emergency Department (HOSPITAL_COMMUNITY): Payer: Medicare Other

## 2011-06-04 ENCOUNTER — Emergency Department (HOSPITAL_COMMUNITY)
Admission: EM | Admit: 2011-06-04 | Discharge: 2011-06-04 | Disposition: A | Discharge status: Home / Self Care | Payer: Medicare Other | Attending: Emergency Medicine | Admitting: Emergency Medicine

## 2011-06-04 DIAGNOSIS — I10 Essential (primary) hypertension: Secondary | ICD-10-CM | POA: Insufficient documentation

## 2011-06-04 DIAGNOSIS — E785 Hyperlipidemia, unspecified: Secondary | ICD-10-CM | POA: Insufficient documentation

## 2011-06-04 DIAGNOSIS — R109 Unspecified abdominal pain: Secondary | ICD-10-CM | POA: Insufficient documentation

## 2011-06-04 DIAGNOSIS — E119 Type 2 diabetes mellitus without complications: Secondary | ICD-10-CM | POA: Insufficient documentation

## 2011-06-04 DIAGNOSIS — N390 Urinary tract infection, site not specified: Secondary | ICD-10-CM | POA: Insufficient documentation

## 2011-06-04 DIAGNOSIS — M545 Low back pain, unspecified: Secondary | ICD-10-CM | POA: Insufficient documentation

## 2011-06-04 DIAGNOSIS — S32009A Unspecified fracture of unspecified lumbar vertebra, initial encounter for closed fracture: Secondary | ICD-10-CM | POA: Insufficient documentation

## 2011-06-04 DIAGNOSIS — X58XXXA Exposure to other specified factors, initial encounter: Secondary | ICD-10-CM | POA: Insufficient documentation

## 2011-06-04 DIAGNOSIS — Z7982 Long term (current) use of aspirin: Secondary | ICD-10-CM | POA: Insufficient documentation

## 2011-06-04 DIAGNOSIS — Z79899 Other long term (current) drug therapy: Secondary | ICD-10-CM | POA: Insufficient documentation

## 2011-06-04 DIAGNOSIS — M533 Sacrococcygeal disorders, not elsewhere classified: Secondary | ICD-10-CM | POA: Insufficient documentation

## 2011-06-04 LAB — COMPREHENSIVE METABOLIC PANEL
ALT: 9 U/L (ref 0–35)
AST: 15 U/L (ref 0–37)
Alkaline Phosphatase: 77 U/L (ref 39–117)
CO2: 28 mEq/L (ref 19–32)
GFR calc Af Amer: >90 mL/min (ref >90)
GFR calc non Af Amer: 79 mL/min — ABNORMAL LOW (ref >90)
Glucose, Bld: 129 mg/dL — ABNORMAL HIGH (ref 70–99)
Potassium: 3.7 mEq/L (ref 3.5–5.1)
Sodium: 133 mEq/L — ABNORMAL LOW (ref 135–145)

## 2011-06-04 LAB — DIFFERENTIAL
Basophils Absolute: 0 10*3/uL (ref 0.0–0.1)
Basophils Relative: 0 % (ref 0–1)
Eosinophils Absolute: 0.1 10*3/uL (ref 0.0–0.7)
Neutro Abs: 7.8 10*3/uL — ABNORMAL HIGH (ref 1.7–7.7)
Neutrophils Relative %: 78 % — ABNORMAL HIGH (ref 43–77)

## 2011-06-04 LAB — CBC
Hemoglobin: 12.5 g/dL (ref 12.0–15.0)
Platelets: 223 10*3/uL (ref 150–400)
RBC: 4.38 MIL/uL (ref 3.87–5.11)
WBC: 10 10*3/uL (ref 4.0–10.5)

## 2011-06-04 LAB — URINE MICROSCOPIC-ADD ON

## 2011-06-04 LAB — URINALYSIS, ROUTINE W REFLEX MICROSCOPIC
Bilirubin Urine: NEGATIVE
Glucose, UA: NEGATIVE mg/dL
Specific Gravity, Urine: 1.014 (ref 1.005–1.030)
Urobilinogen, UA: 1 mg/dL (ref 0.0–1.0)

## 2011-06-07 LAB — URINE CULTURE
Colony Count: >=100000
Culture  Setup Time: 201210212121

## 2011-06-12 ENCOUNTER — Other Ambulatory Visit (HOSPITAL_COMMUNITY): Payer: Self-pay | Admitting: Orthopedic Surgery

## 2011-06-12 DIAGNOSIS — M545 Low back pain, unspecified: Secondary | ICD-10-CM

## 2011-06-15 ENCOUNTER — Ambulatory Visit (HOSPITAL_COMMUNITY): Payer: Medicare Other

## 2011-06-15 ENCOUNTER — Encounter (HOSPITAL_COMMUNITY): Payer: Medicare Other

## 2011-06-15 ENCOUNTER — Encounter (HOSPITAL_COMMUNITY)
Admission: RE | Admit: 2011-06-15 | Discharge: 2011-06-15 | Disposition: A | Discharge status: Home / Self Care | Payer: Medicare Other | Source: Ambulatory Visit | Attending: Orthopedic Surgery | Admitting: Orthopedic Surgery

## 2011-06-15 ENCOUNTER — Encounter (HOSPITAL_COMMUNITY): Admission: RE | Admit: 2011-06-15 | Payer: Medicare Other | Source: Ambulatory Visit

## 2011-06-15 DIAGNOSIS — M545 Low back pain, unspecified: Secondary | ICD-10-CM

## 2011-06-15 DIAGNOSIS — M5137 Other intervertebral disc degeneration, lumbosacral region: Secondary | ICD-10-CM | POA: Insufficient documentation

## 2011-06-15 DIAGNOSIS — M51379 Other intervertebral disc degeneration, lumbosacral region without mention of lumbar back pain or lower extremity pain: Secondary | ICD-10-CM | POA: Insufficient documentation

## 2011-06-15 MED ORDER — TECHNETIUM TC 99M MEDRONATE IV KIT
23.7000 | PACK | Freq: Once | INTRAVENOUS | Status: AC | PRN
  Administered 2011-06-15: 23.7 via INTRAVENOUS

## 2011-08-17 DIAGNOSIS — M545 Low back pain, unspecified: Secondary | ICD-10-CM | POA: Diagnosis not present

## 2011-08-23 DIAGNOSIS — M545 Low back pain, unspecified: Secondary | ICD-10-CM | POA: Diagnosis not present

## 2011-08-25 DIAGNOSIS — M545 Low back pain, unspecified: Secondary | ICD-10-CM | POA: Diagnosis not present

## 2011-08-28 DIAGNOSIS — M545 Low back pain, unspecified: Secondary | ICD-10-CM | POA: Diagnosis not present

## 2011-08-30 DIAGNOSIS — M545 Low back pain, unspecified: Secondary | ICD-10-CM | POA: Diagnosis not present

## 2011-09-04 DIAGNOSIS — M545 Low back pain, unspecified: Secondary | ICD-10-CM | POA: Diagnosis not present

## 2011-09-06 DIAGNOSIS — E119 Type 2 diabetes mellitus without complications: Secondary | ICD-10-CM | POA: Diagnosis not present

## 2011-09-06 DIAGNOSIS — R634 Abnormal weight loss: Secondary | ICD-10-CM | POA: Diagnosis not present

## 2011-09-06 DIAGNOSIS — E785 Hyperlipidemia, unspecified: Secondary | ICD-10-CM | POA: Diagnosis not present

## 2011-09-06 DIAGNOSIS — M810 Age-related osteoporosis without current pathological fracture: Secondary | ICD-10-CM | POA: Diagnosis not present

## 2011-09-06 DIAGNOSIS — D649 Anemia, unspecified: Secondary | ICD-10-CM | POA: Diagnosis not present

## 2011-09-06 DIAGNOSIS — G4700 Insomnia, unspecified: Secondary | ICD-10-CM | POA: Diagnosis not present

## 2011-09-06 DIAGNOSIS — E559 Vitamin D deficiency, unspecified: Secondary | ICD-10-CM | POA: Diagnosis not present

## 2011-09-06 DIAGNOSIS — M1990 Unspecified osteoarthritis, unspecified site: Secondary | ICD-10-CM | POA: Diagnosis not present

## 2011-09-06 DIAGNOSIS — M549 Dorsalgia, unspecified: Secondary | ICD-10-CM | POA: Diagnosis not present

## 2011-09-08 DIAGNOSIS — M545 Low back pain, unspecified: Secondary | ICD-10-CM | POA: Diagnosis not present

## 2011-09-11 DIAGNOSIS — M545 Low back pain, unspecified: Secondary | ICD-10-CM | POA: Diagnosis not present

## 2011-09-13 DIAGNOSIS — M545 Low back pain, unspecified: Secondary | ICD-10-CM | POA: Diagnosis not present

## 2011-09-18 DIAGNOSIS — M545 Low back pain, unspecified: Secondary | ICD-10-CM | POA: Diagnosis not present

## 2011-09-18 DIAGNOSIS — M47817 Spondylosis without myelopathy or radiculopathy, lumbosacral region: Secondary | ICD-10-CM | POA: Diagnosis not present

## 2011-09-24 ENCOUNTER — Emergency Department (HOSPITAL_COMMUNITY): Payer: Medicare Other

## 2011-09-24 ENCOUNTER — Inpatient Hospital Stay (HOSPITAL_COMMUNITY)
Admission: EM | Admit: 2011-09-24 | Discharge: 2011-09-30 | DRG: 309 | Disposition: A | Discharge status: Home / Self Care | Payer: Medicare Other | Attending: Internal Medicine | Admitting: Internal Medicine

## 2011-09-24 ENCOUNTER — Other Ambulatory Visit: Payer: Self-pay

## 2011-09-24 ENCOUNTER — Encounter (HOSPITAL_COMMUNITY): Payer: Self-pay | Admitting: Family Medicine

## 2011-09-24 DIAGNOSIS — Z860100 Personal history of colon polyps, unspecified: Secondary | ICD-10-CM

## 2011-09-24 DIAGNOSIS — R5381 Other malaise: Secondary | ICD-10-CM | POA: Diagnosis not present

## 2011-09-24 DIAGNOSIS — E876 Hypokalemia: Secondary | ICD-10-CM | POA: Diagnosis present

## 2011-09-24 DIAGNOSIS — N390 Urinary tract infection, site not specified: Secondary | ICD-10-CM | POA: Diagnosis not present

## 2011-09-24 DIAGNOSIS — E119 Type 2 diabetes mellitus without complications: Secondary | ICD-10-CM | POA: Diagnosis not present

## 2011-09-24 DIAGNOSIS — Z96619 Presence of unspecified artificial shoulder joint: Secondary | ICD-10-CM | POA: Diagnosis not present

## 2011-09-24 DIAGNOSIS — R1311 Dysphagia, oral phase: Secondary | ICD-10-CM | POA: Diagnosis not present

## 2011-09-24 DIAGNOSIS — I4589 Other specified conduction disorders: Secondary | ICD-10-CM | POA: Diagnosis not present

## 2011-09-24 DIAGNOSIS — I059 Rheumatic mitral valve disease, unspecified: Secondary | ICD-10-CM | POA: Diagnosis not present

## 2011-09-24 DIAGNOSIS — R001 Bradycardia, unspecified: Secondary | ICD-10-CM | POA: Diagnosis present

## 2011-09-24 DIAGNOSIS — IMO0001 Reserved for inherently not codable concepts without codable children: Secondary | ICD-10-CM | POA: Diagnosis not present

## 2011-09-24 DIAGNOSIS — R55 Syncope and collapse: Secondary | ICD-10-CM | POA: Diagnosis not present

## 2011-09-24 DIAGNOSIS — E7800 Pure hypercholesterolemia, unspecified: Secondary | ICD-10-CM | POA: Diagnosis present

## 2011-09-24 DIAGNOSIS — I498 Other specified cardiac arrhythmias: Principal | ICD-10-CM | POA: Diagnosis present

## 2011-09-24 DIAGNOSIS — I2510 Atherosclerotic heart disease of native coronary artery without angina pectoris: Secondary | ICD-10-CM | POA: Diagnosis not present

## 2011-09-24 DIAGNOSIS — E785 Hyperlipidemia, unspecified: Secondary | ICD-10-CM | POA: Diagnosis present

## 2011-09-24 DIAGNOSIS — R5383 Other fatigue: Secondary | ICD-10-CM | POA: Diagnosis not present

## 2011-09-24 DIAGNOSIS — E782 Mixed hyperlipidemia: Secondary | ICD-10-CM | POA: Diagnosis not present

## 2011-09-24 DIAGNOSIS — Z882 Allergy status to sulfonamides status: Secondary | ICD-10-CM | POA: Diagnosis not present

## 2011-09-24 DIAGNOSIS — I517 Cardiomegaly: Secondary | ICD-10-CM | POA: Diagnosis not present

## 2011-09-24 DIAGNOSIS — I359 Nonrheumatic aortic valve disorder, unspecified: Secondary | ICD-10-CM | POA: Diagnosis not present

## 2011-09-24 DIAGNOSIS — Z8249 Family history of ischemic heart disease and other diseases of the circulatory system: Secondary | ICD-10-CM

## 2011-09-24 DIAGNOSIS — K921 Melena: Secondary | ICD-10-CM | POA: Diagnosis not present

## 2011-09-24 DIAGNOSIS — I10 Essential (primary) hypertension: Secondary | ICD-10-CM | POA: Diagnosis present

## 2011-09-24 DIAGNOSIS — R1310 Dysphagia, unspecified: Secondary | ICD-10-CM | POA: Diagnosis present

## 2011-09-24 DIAGNOSIS — J9819 Other pulmonary collapse: Secondary | ICD-10-CM | POA: Diagnosis not present

## 2011-09-24 DIAGNOSIS — R9431 Abnormal electrocardiogram [ECG] [EKG]: Secondary | ICD-10-CM | POA: Diagnosis not present

## 2011-09-24 DIAGNOSIS — R05 Cough: Secondary | ICD-10-CM | POA: Diagnosis not present

## 2011-09-24 DIAGNOSIS — M549 Dorsalgia, unspecified: Secondary | ICD-10-CM | POA: Diagnosis present

## 2011-09-24 DIAGNOSIS — R059 Cough, unspecified: Secondary | ICD-10-CM | POA: Diagnosis not present

## 2011-09-24 DIAGNOSIS — R531 Weakness: Secondary | ICD-10-CM | POA: Diagnosis present

## 2011-09-24 DIAGNOSIS — D62 Acute posthemorrhagic anemia: Secondary | ICD-10-CM | POA: Diagnosis not present

## 2011-09-24 DIAGNOSIS — G8929 Other chronic pain: Secondary | ICD-10-CM | POA: Diagnosis present

## 2011-09-24 DIAGNOSIS — R404 Transient alteration of awareness: Secondary | ICD-10-CM | POA: Diagnosis not present

## 2011-09-24 DIAGNOSIS — Z888 Allergy status to other drugs, medicaments and biological substances status: Secondary | ICD-10-CM | POA: Diagnosis not present

## 2011-09-24 DIAGNOSIS — I429 Cardiomyopathy, unspecified: Secondary | ICD-10-CM | POA: Diagnosis not present

## 2011-09-24 HISTORY — DX: Pure hypercholesterolemia, unspecified: E78.00

## 2011-09-24 HISTORY — DX: Unspecified osteoarthritis, unspecified site: M19.90

## 2011-09-24 HISTORY — DX: Essential (primary) hypertension: I10

## 2011-09-24 LAB — URINALYSIS, ROUTINE W REFLEX MICROSCOPIC
Bilirubin Urine: NEGATIVE
Ketones, ur: NEGATIVE mg/dL
Nitrite: POSITIVE — AB
Specific Gravity, Urine: 1.012 (ref 1.005–1.030)
Urobilinogen, UA: 1 mg/dL (ref 0.0–1.0)
pH: 7 (ref 5.0–8.0)

## 2011-09-24 LAB — GLUCOSE, CAPILLARY
Glucose-Capillary: 182 mg/dL — ABNORMAL HIGH (ref 70–99)
Glucose-Capillary: 200 mg/dL — ABNORMAL HIGH (ref 70–99)

## 2011-09-24 LAB — BASIC METABOLIC PANEL
BUN: 9 mg/dL (ref 6–23)
CO2: 33 mEq/L — ABNORMAL HIGH (ref 19–32)
Chloride: 96 mEq/L (ref 96–112)
Glucose, Bld: 212 mg/dL — ABNORMAL HIGH (ref 70–99)
Potassium: 3.2 mEq/L — ABNORMAL LOW (ref 3.5–5.1)
Sodium: 135 mEq/L (ref 135–145)

## 2011-09-24 LAB — CBC
HCT: 38.5 % (ref 36.0–46.0)
Hemoglobin: 12 g/dL (ref 12.0–15.0)
RBC: 4.23 MIL/uL (ref 3.87–5.11)

## 2011-09-24 LAB — URINE MICROSCOPIC-ADD ON

## 2011-09-24 LAB — OCCULT BLOOD, POC DEVICE: Fecal Occult Bld: POSITIVE

## 2011-09-24 LAB — CARDIAC PANEL(CRET KIN+CKTOT+MB+TROPI)
Relative Index: INVALID (ref 0.0–2.5)
Total CK: 64 U/L (ref 7–177)

## 2011-09-24 MED ORDER — IRON 240 (27 FE) MG PO TABS: 1.0000 | ORAL_TABLET | Freq: Every day | ORAL | Status: DC

## 2011-09-24 MED ORDER — VITAMIN D3 125 MCG (5000 UT) PO CAPS: 1.0000 | ORAL_CAPSULE | Freq: Every day | ORAL | Status: DC

## 2011-09-24 MED ORDER — ALUM & MAG HYDROXIDE-SIMETH 200-200-20 MG/5ML PO SUSP
30.0000 mL | Freq: Four times a day (QID) | ORAL | Status: DC | PRN
Start: 2011-09-24 — End: 2011-09-30

## 2011-09-24 MED ORDER — SODIUM CHLORIDE 0.9 % IV SOLN
INTRAVENOUS | Status: DC
  Administered 2011-09-24: 19:00:00 via INTRAVENOUS

## 2011-09-24 MED ORDER — ONDANSETRON HCL 4 MG/2ML IJ SOLN
INTRAMUSCULAR | Status: AC
  Administered 2011-09-24: 4 mg
  Filled 2011-09-24: qty 2

## 2011-09-24 MED ORDER — SENNA 8.6 MG PO TABS
1.0000 | ORAL_TABLET | Freq: Two times a day (BID) | ORAL | Status: DC
  Administered 2011-09-24 – 2011-09-30 (×12): 8.6 mg via ORAL
  Filled 2011-09-24 (×13): qty 1

## 2011-09-24 MED ORDER — DOCUSATE SODIUM 100 MG PO CAPS
100.0000 mg | ORAL_CAPSULE | Freq: Two times a day (BID) | ORAL | Status: DC
  Administered 2011-09-24 – 2011-09-30 (×12): 100 mg via ORAL
  Filled 2011-09-24 (×13): qty 1

## 2011-09-24 MED ORDER — AMLODIPINE BESYLATE 5 MG PO TABS
5.0000 mg | ORAL_TABLET | Freq: Every day | ORAL | Status: DC
  Administered 2011-09-24 – 2011-09-30 (×7): 5 mg via ORAL
  Filled 2011-09-24 (×7): qty 1

## 2011-09-24 MED ORDER — ALBUTEROL SULFATE (5 MG/ML) 0.5% IN NEBU: 2.5000 mg | INHALATION_SOLUTION | RESPIRATORY_TRACT | Status: DC | PRN

## 2011-09-24 MED ORDER — ONDANSETRON HCL 4 MG PO TABS: 4.0000 mg | ORAL_TABLET | Freq: Four times a day (QID) | ORAL | Status: DC | PRN

## 2011-09-24 MED ORDER — ACETAMINOPHEN 650 MG RE SUPP: 650.0000 mg | Freq: Four times a day (QID) | RECTAL | Status: DC | PRN

## 2011-09-24 MED ORDER — VITAMIN D3 25 MCG (1000 UNIT) PO TABS
5000.0000 [IU] | ORAL_TABLET | Freq: Every day | ORAL | Status: DC
  Administered 2011-09-24 – 2011-09-30 (×7): 5000 [IU] via ORAL
  Filled 2011-09-24 (×7): qty 5

## 2011-09-24 MED ORDER — FERROUS GLUCONATE 324 (38 FE) MG PO TABS
324.0000 mg | ORAL_TABLET | Freq: Every day | ORAL | Status: DC
  Administered 2011-09-24 – 2011-09-30 (×7): 324 mg via ORAL
  Filled 2011-09-24 (×10): qty 1

## 2011-09-24 MED ORDER — CYANOCOBALAMIN 500 MCG PO TABS
500.0000 ug | ORAL_TABLET | Freq: Every day | ORAL | Status: DC
  Administered 2011-09-24 – 2011-09-30 (×7): 500 ug via ORAL
  Filled 2011-09-24 (×7): qty 1

## 2011-09-24 MED ORDER — INSULIN ASPART 100 UNIT/ML ~~LOC~~ SOLN
0.0000 [IU] | Freq: Three times a day (TID) | SUBCUTANEOUS | Status: DC
  Administered 2011-09-25 – 2011-09-26 (×2): 3 [IU] via SUBCUTANEOUS
  Administered 2011-09-26 (×2): 2 [IU] via SUBCUTANEOUS
  Administered 2011-09-27: 1 [IU] via SUBCUTANEOUS
  Administered 2011-09-27 (×2): 2 [IU] via SUBCUTANEOUS
  Administered 2011-09-28: 1 [IU] via SUBCUTANEOUS
  Administered 2011-09-28: 3 [IU] via SUBCUTANEOUS
  Administered 2011-09-28: 2 [IU] via SUBCUTANEOUS
  Administered 2011-09-29: 3 [IU] via SUBCUTANEOUS
  Administered 2011-09-29 – 2011-09-30 (×3): 2 [IU] via SUBCUTANEOUS
  Administered 2011-09-30: 1 [IU] via SUBCUTANEOUS
  Filled 2011-09-24: qty 3

## 2011-09-24 MED ORDER — SIMVASTATIN 20 MG PO TABS
20.0000 mg | ORAL_TABLET | Freq: Every day | ORAL | Status: DC
  Administered 2011-09-24 – 2011-09-29 (×6): 20 mg via ORAL
  Filled 2011-09-24 (×7): qty 1

## 2011-09-24 MED ORDER — SODIUM CHLORIDE 0.9 % IJ SOLN
3.0000 mL | Freq: Two times a day (BID) | INTRAMUSCULAR | Status: DC
  Administered 2011-09-24 – 2011-09-30 (×6): 3 mL via INTRAVENOUS

## 2011-09-24 MED ORDER — VITAMIN B-12 500 MCG PO TABS: 500.0000 ug | ORAL_TABLET | Freq: Every day | ORAL | Status: DC

## 2011-09-24 MED ORDER — B COMPLEX-C PO TABS
1.0000 | ORAL_TABLET | Freq: Every day | ORAL | Status: DC
  Administered 2011-09-24 – 2011-09-25 (×2): 1 via ORAL
  Administered 2011-09-26: 2 via ORAL
  Administered 2011-09-27 – 2011-09-28 (×2): 1 via ORAL
  Administered 2011-09-29 – 2011-09-30 (×2): 2 via ORAL
  Filled 2011-09-24 (×7): qty 2

## 2011-09-24 MED ORDER — AMLODIPINE BESY-BENAZEPRIL HCL 5-20 MG PO CAPS: 1.0000 | ORAL_CAPSULE | Freq: Every day | ORAL | Status: DC

## 2011-09-24 MED ORDER — DEXTROSE 5 % IV SOLN
1.0000 g | INTRAVENOUS | Status: AC
  Administered 2011-09-24 – 2011-09-29 (×6): 1 g via INTRAVENOUS
  Filled 2011-09-24 (×6): qty 10

## 2011-09-24 MED ORDER — HYDRALAZINE HCL 20 MG/ML IJ SOLN
10.0000 mg | Freq: Four times a day (QID) | INTRAMUSCULAR | Status: DC | PRN
  Administered 2011-09-25: 10 mg via INTRAVENOUS
  Filled 2011-09-24: qty 0.5

## 2011-09-24 MED ORDER — LINAGLIPTIN 5 MG PO TABS
5.0000 mg | ORAL_TABLET | Freq: Every day | ORAL | Status: DC
  Administered 2011-09-25 – 2011-09-30 (×6): 5 mg via ORAL
  Filled 2011-09-24 (×7): qty 1

## 2011-09-24 MED ORDER — MORPHINE SULFATE 2 MG/ML IJ SOLN: 0.5000 mg | INTRAMUSCULAR | Status: DC | PRN

## 2011-09-24 MED ORDER — TRAMADOL HCL 50 MG PO TABS
50.0000 mg | ORAL_TABLET | Freq: Four times a day (QID) | ORAL | Status: DC | PRN
  Filled 2011-09-24: qty 1

## 2011-09-24 MED ORDER — ONDANSETRON HCL 4 MG/2ML IJ SOLN
4.0000 mg | Freq: Four times a day (QID) | INTRAMUSCULAR | Status: DC | PRN
  Administered 2011-09-24: 4 mg via INTRAVENOUS
  Filled 2011-09-24: qty 2

## 2011-09-24 MED ORDER — BENAZEPRIL HCL 20 MG PO TABS
20.0000 mg | ORAL_TABLET | Freq: Every day | ORAL | Status: DC
  Administered 2011-09-24 – 2011-09-30 (×7): 20 mg via ORAL
  Filled 2011-09-24 (×7): qty 1

## 2011-09-24 MED ORDER — ACETAMINOPHEN 325 MG PO TABS: 650.0000 mg | ORAL_TABLET | Freq: Four times a day (QID) | ORAL | Status: DC | PRN

## 2011-09-24 NOTE — Consult Note (Signed)
Reason for Consult: Weakness/marked transient sinus bradycardia Referring Physician: [facility] hospital[facility]  Heidi Dominguez is an 78 y.o. female.  HPI: Patient is 78 year old female with past medical history significant for hypertension diabetes mellitus hypercholesteremia spinal stenosis degenerative joint disease positive family history of coronary artery disease came to the ER because of generalized weakness and near syncopal episode while using the commode at home. Patient denies any palpitation lightheadedness or syncopal episode. Patient denies any chest pain nausea vomiting or diaphoresis. Patient denies any seizure activity. Patient denied any weakness in the arms or legs. Patient does complain of prior feeling in the arms with walking and no energy. Patient also gives history of frequency of urination states gets a 5-6 times per night and also has incontinence of urine and was noted to have UTI. Cardiologic consultation is obtained as patient during evaluation in the ED was noted to have marked transient sinus bradycardia with heart rate in 30s to 40s her EKG showed marked sinus bradycardia with minor ST-T wave changes in anteroseptal leads. Patient denies any recent cardiac workup states has seen [name] 3 to4 years ago. Patient to on labetalol and bisoprolol which could potentially slowdown her heart rate.  Past Medical History  Diagnosis Date  . Diabetes mellitus   . Hypertension   . High cholesterol   . Osteoarthritis     History reviewed. No pertinent past surgical history.  History reviewed. No pertinent family history.  Social History:  does not have a smoking history on file. She does not have any smokeless tobacco history on file. She reports that she does not drink alcohol or use illicit drugs.  Allergies:  Allergies  Allergen Reactions  . Catapres (Clonidine Hcl) Other (See Comments)    Unknown.  . Covera-Hs (Verapamil Hcl) Other (See Comments)    Unknown.  . Other  Other (See Comments)    Any type of narcotics Feels "loopy"  . Sulfa Antibiotics     Medications: I have reviewed the patient's current medications.  Results for orders placed during the hospital encounter of 09/24/11 (from the past 48 hour(s))  URINALYSIS, ROUTINE W REFLEX MICROSCOPIC     Status: Abnormal   Collection Time   09/24/11  1:47 PM      Component Value Range Comment   Color, Urine YELLOW  YELLOW     APPearance CLOUDY (*) CLEAR     Specific Gravity, Urine 1.012  1.005 - 1.030     pH 7.0  5.0 - 8.0     Glucose, UA NEGATIVE  NEGATIVE (mg/dL)    Hgb urine dipstick MODERATE (*) NEGATIVE     Bilirubin Urine NEGATIVE  NEGATIVE     Ketones, ur NEGATIVE  NEGATIVE (mg/dL)    Protein, ur 30 (*) NEGATIVE (mg/dL)    Urobilinogen, UA 1.0  0.0 - 1.0 (mg/dL)    Nitrite POSITIVE (*) NEGATIVE     Leukocytes, UA LARGE (*) NEGATIVE    URINE MICROSCOPIC-ADD ON     Status: Abnormal   Collection Time   09/24/11  1:47 PM      Component Value Range Comment   Squamous Epithelial / LPF FEW (*) RARE     WBC, UA 11-20  <3 (WBC/hpf)    RBC / HPF 21-50  <3 (RBC/hpf)    Bacteria, UA MANY (*) RARE    CBC     Status: Normal   Collection Time   09/24/11  1:49 PM      Component Value Range Comment  WBC 7.5  4.0 - 10.5 (K/uL)    RBC 4.23  3.87 - 5.11 (MIL/uL)    Hemoglobin 12.0  12.0 - 15.0 (g/dL)    HCT 56.2  13.0 - 86.5 (%)    MCV 91.0  78.0 - 100.0 (fL)    MCH 28.4  26.0 - 34.0 (pg)    MCHC 31.2  30.0 - 36.0 (g/dL)    RDW 78.4  69.6 - 29.5 (%)    Platelets 176  150 - 400 (K/uL)   BASIC METABOLIC PANEL     Status: Abnormal   Collection Time   09/24/11  1:49 PM      Component Value Range Comment   Sodium 135  135 - 145 (mEq/L)    Potassium 3.2 (*) 3.5 - 5.1 (mEq/L)    Chloride 96  96 - 112 (mEq/L)    CO2 33 (*) 19 - 32 (mEq/L)    Glucose, Bld 212 (*) 70 - 99 (mg/dL)    BUN 9  6 - 23 (mg/dL)    Creatinine, Ser 2.84  0.50 - 1.10 (mg/dL)    Calcium 9.6  8.4 - 10.5 (mg/dL)    GFR calc non  Af Amer 77 (*) >90 (mL/min)    GFR calc Af Amer 89 (*) >90 (mL/min)   TROPONIN I     Status: Normal   Collection Time   09/24/11  1:51 PM      Component Value Range Comment   Troponin I <0.30  <0.30 (ng/mL)   OCCULT BLOOD, POC DEVICE     Status: Normal   Collection Time   09/24/11  3:05 PM      Component Value Range Comment   Fecal Occult Bld POSITIVE       Dg Chest 2 View  09/24/2011  *RADIOLOGY REPORT*  Clinical Data: Cough  CHEST - 2 VIEW  Comparison: 04/13/2009  Findings: Low lung volumes with bibasilar atelectasis. No pleural effusion or pneumothorax.  Mild cardiomegaly.  Degenerative changes of the visualized thoracolumbar spine.  Prior lumbar vertebral augmentation.  Bilateral shoulder arthroplasties.  IMPRESSION: Low lung volumes with bibasilar atelectasis.  Mild cardiomegaly.  Original Report Authenticated By: Charline Bills, M.D.    Review of Systems  Constitutional: Negative for fever and chills.  HENT: Negative for hearing loss.   Respiratory: Negative for cough, hemoptysis, sputum production and shortness of breath.   Cardiovascular: Negative for chest pain, palpitations and leg swelling.  Gastrointestinal: Positive for diarrhea and blood in stool. Negative for nausea, vomiting, abdominal pain and constipation.  Genitourinary: Positive for urgency and frequency. Negative for dysuria.  Musculoskeletal: Positive for myalgias and back pain.  Skin: Negative for itching and rash.  Neurological: Positive for weakness. Negative for dizziness and headaches.   Blood pressure 166/64, pulse 69, temperature 98.9 F (37.2 C), temperature source Oral, resp. rate 18, SpO2 94.00%. Physical Exam  Constitutional: She is oriented to person, place, and time. She appears well-developed.  HENT:  Head: Normocephalic and atraumatic.  Mouth/Throat: No oropharyngeal exudate.  Eyes: Conjunctivae are normal. Left eye exhibits no discharge. No scleral icterus.  Neck: No JVD present. No tracheal  deviation present. No thyromegaly present.  Cardiovascular: Normal rate and regular rhythm.        3/6 systolic murmur noted no S3 gallop  Respiratory: Effort normal and breath sounds normal. No respiratory distress. She has no wheezes. She has no rales.  GI: Soft. Bowel sounds are normal. She exhibits no distension. There is no tenderness. There is  no rebound.  Musculoskeletal: She exhibits no edema and no tenderness.  Lymphadenopathy:    She has no cervical adenopathy.  Neurological: She is alert and oriented to person, place, and time.    Assessment/Plan: Status post symptomatic sinus bradycardia/weakness probably secondary to meds rule out sick sinus syndrome Abnormal EKG with exertional arm weakness rule out coronary insufficiency Aortic stenosis Hypertension Diabetes mellitus Hypercholesteremia UTI Spinal stenosis/degenerative joint disease spine Plan Check serial enzymes and EKG 2-D echo in a.m. Agree with holding beta blockers May need cardiac catheterization in view of abnormal EKG multiple risk factors for CAD and atypical symptoms. May need permanent pacemaker she had frequent episodes of symptomatic bradycardia of beta blockers [name] will follow in a.m. Check old records  [facility] N 09/24/2011, 4:52 PM

## 2011-09-24 NOTE — ED Provider Notes (Addendum)
History     CSN: [id number]  Arrival date & time 09/24/11  1256   First MD Initiated Contact with Patient 09/24/11 1305      Chief Complaint  Patient presents with  . Weakness    (Consider location/radiation/quality/duration/timing/severity/associated sxs/prior treatment) HPI  Pt presents to the ED by ambulance. Pt began not feeling well on Friday (3 days ago) with symptoms of a mild cough which she has taken Robotussin for. She also has been feeling constipated in which she has been taking stool softner. Today she was sitting on the toilet and stated "it took a long time for the bowel movement to come". According to the niece, the patient was sitting on the toilet and went unresponsive. She denies the patient falling off of the toilet. The patient remember sitting on the toilet and then remembers the EMTs in her house but not anything else in between. The patient states that she feels fine and is not having any headache, chest pain, SOB, abdominal pain, N/V/D, bloody bowel movements, blood in her urine or urinary symptoms, or pain anywhere.  Past Medical History  Diagnosis Date  . Diabetes mellitus   . Hypertension   . High cholesterol   . Osteoarthritis     History reviewed. No pertinent past surgical history.  History reviewed. No pertinent family history.  History  Substance Use Topics  . Smoking status: Not on file  . Smokeless tobacco: Not on file  . Alcohol Use: No    OB History    Grav Para Term Preterm Abortions TAB SAB Ect Mult Living                  Review of Systems  All other systems reviewed and are negative.    Allergies  Catapres; Covera-hs; Other; and Sulfa antibiotics  Home Medications   Current Outpatient Rx  Name Route Sig Dispense Refill  . AMLODIPINE BESY-BENAZEPRIL HCL 5-20 MG PO CAPS Oral Take 1 capsule by mouth daily.    . B COMPLEX-C PO TABS Oral Take 1-2 tablets by mouth daily.    Marland Kitchen BISOPROLOL-HYDROCHLOROTHIAZIDE 2.5-6.25 MG PO  TABS Oral Take 1 tablet by mouth daily.    Marland Kitchen VITAMIN D3 5000 UNITS PO CAPS Oral Take 1 capsule by mouth daily.    . IRON 240 (27 FE) MG PO TABS Oral Take 1 tablet by mouth daily.    Marland Kitchen GLIMEPIRIDE 1 MG PO TABS Oral Take 1 mg by mouth daily with breakfast.    . LABETALOL HCL 200 MG PO TABS Oral Take 200 mg by mouth 2 (two) times daily.    Marland Kitchen MAGNESIUM 250 MG PO TABS Oral Take 1 tablet by mouth daily.    Marland Kitchen METFORMIN HCL ER 500 MG PO TB24 Oral Take 500-1,000 mg by mouth 2 (two) times daily. 2 tablets in the morning 1 tablet in the evening.    Marland Kitchen METHOCARBAMOL 500 MG PO TABS Oral Take 500 mg by mouth 3 (three) times daily.    Marland Kitchen PRAVASTATIN SODIUM 40 MG PO TABS Oral Take 40 mg by mouth daily.    Marland Kitchen SITAGLIPTIN PHOSPHATE 100 MG PO TABS Oral Take 100 mg by mouth daily.    Marland Kitchen VITAMIN B-12 500 MCG PO TABS Oral Take 500 mcg by mouth daily.      BP 161/68  Pulse 76  Temp(Src) 98.6 F (37 C) (Oral)  Resp 20  SpO2 96%  Physical Exam  Constitutional: She appears well-developed and well-nourished.  HENT:  Head:  Normocephalic and atraumatic.  Eyes: Conjunctivae are normal. Pupils are equal, round, and reactive to light.  Neck: Trachea normal, normal range of motion and full passive range of motion without pain. Neck supple.  Cardiovascular: Normal rate, regular rhythm and normal pulses.   Pulmonary/Chest: Effort normal. Chest wall is not dull to percussion. She exhibits no crepitus, no edema, no deformity and no retraction.  Abdominal: Soft. Normal appearance and bowel sounds are normal. She exhibits no distension. There is tenderness (diffuse). There is no rebound.  Genitourinary: Guaiac positive stool.  Musculoskeletal: Normal range of motion.  Neurological: She is alert. She has normal strength.  Skin: Skin is warm, dry and intact.  Psychiatric: She has a normal mood and affect. Her speech is normal and behavior is normal. Judgment and thought content normal. Cognition and memory are normal.    ED  Course  Procedures (including critical care time)  Labs Reviewed  URINALYSIS, ROUTINE W REFLEX MICROSCOPIC - Abnormal; Notable for the following:    APPearance CLOUDY (*)    Hgb urine dipstick MODERATE (*)    Protein, ur 30 (*)    Nitrite POSITIVE (*)    Leukocytes, UA LARGE (*)    All other components within normal limits  BASIC METABOLIC PANEL - Abnormal; Notable for the following:    Potassium 3.2 (*)    CO2 33 (*)    Glucose, Bld 212 (*)    GFR calc non Af Amer 77 (*)    GFR calc Af Amer 89 (*)    All other components within normal limits  URINE MICROSCOPIC-ADD ON - Abnormal; Notable for the following:    Squamous Epithelial / LPF FEW (*)    Bacteria, UA MANY (*)    All other components within normal limits  CBC  TROPONIN I  OCCULT BLOOD, POC DEVICE  URINE CULTURE  STOOL CULTURE   Dg Chest 2 View  09/24/2011  *RADIOLOGY REPORT*  Clinical Data: Cough  CHEST - 2 VIEW  Comparison: 04/13/2009  Findings: Low lung volumes with bibasilar atelectasis. No pleural effusion or pneumothorax.  Mild cardiomegaly.  Degenerative changes of the visualized thoracolumbar spine.  Prior lumbar vertebral augmentation.  Bilateral shoulder arthroplasties.  IMPRESSION: Low lung volumes with bibasilar atelectasis.  Mild cardiomegaly.  Original Report Authenticated By: Charline Bills, M.D.     1. Weakness   2. Hematochezia   3. UTI (lower urinary tract infection)   4. Syncope       MDM  .  Date: 09/24/2011  Rate: 79  Rhythm: normal sinus rhythm  QRS Axis: normal  Intervals: abnormal R progression  ST/T Wave abnormalities: borderline T abnormality  Conduction Disutrbances:none  Narrative Interpretation:   Old EKG Reviewed: unchanged from Aug 31- 2010    Patient Complaints #1 weakness #2 possible syncopal episode #3 bloody bowel movement  Time of re-evaluation 3:00 PM- when the EMTechs went to move the patient and she involuntarily passed a bloody watery bowel movement in her  stretcher. I was called in to see the patient and witnessed the blood. There was not a large amount of blood, however, it was mixed in with the stool. The patient also noted to have a UTI. Pts Troponin is negative. Will admit patient for observation, monitoring of hemoglobin, abx for UTI. Pts PCP is [facility].  Plan  My plan is to get patient admitted for acute GI bleed       Dorthula Matas, PA 09/24/11 1531  Dorthula Matas, PA 11/16/11 [phone]

## 2011-09-24 NOTE — H&P (Signed)
PATIENT DETAILS Name: Heidi Dominguez Age: 78 y.o. Sex: female Date of Birth: 1920-12-11 Admit Date: 09/24/2011 ZOX:[id number],VWUJ, MD, MD   CHIEF COMPLAINT:  Weakness  HPI: Patient is a 78 year old African American female with a past medical history of diabetes, hypertension, chronic back pain, osteoarthritis who moves around with the help of a rolling walker comes in to the ED with the above-noted complaints. Apparently this morning patient felt weak transiently, she thought that she was hypoglycemic and try to measure her sugar however the machine was not able to read it. Later in the morning she went to the bathroom and felt very weak when on the commode, she claims that she couldn't stand up. She then called for help from her family members who then brought to the emergency room. Patient is not the best historian, however upon repeat it is asking whether she felt dizzy or lightheaded when she was weak she repeatedly denies it. She denies any chest pain or shortness of breath. She denies any syncopal episodes recently. She denies any diarrhea to me. While in the emergency room she was noted to have 1 loose stool with some amount of blood. When asked if hematochezia was a regular occurrence, she denies it. She claims she did not notice any blood in the stool over the past few days. Patient has chronic back pain and sees a spine specialist, but history obtained from the patient and the family, patient is no longer a surgical candidate. She continues to have chronic low back pains, her family claims that she was given a "pain medication" as a sample from her Spine specialist last Monday, and that made her drowsy, and she stopped taking it. During my evaluation, I asked the patient to set up which caused her to have excruciating back pain, it was then noted that the heart rate dropped to the low 40s and high 30s. A 12-lead EKG done during that time shows sinus bradycardia with some nonspecific ST changes.  This is very transient and lasted only for a couple of minutes, heart rate went back up to the 60s and 70s.  ALLERGIES:   Allergies  Allergen Reactions  . Catapres (Clonidine Hcl) Other (See Comments)    Unknown.  . Covera-Hs (Verapamil Hcl) Other (See Comments)    Unknown.  . Other Other (See Comments)    Any type of narcotics Feels "loopy"  . Sulfa Antibiotics     PAST MEDICAL HISTORY: Past Medical History  Diagnosis Date  . Diabetes mellitus   . Hypertension   . High cholesterol   . Osteoarthritis     PAST SURGICAL HISTORY: History reviewed. No pertinent past surgical history.  MEDICATIONS AT HOME: Prior to Admission medications   Medication Sig Start Date End Date Taking? Authorizing Provider  amLODipine-benazepril (LOTREL) 5-20 MG per capsule Take 1 capsule by mouth daily.   Yes Historical Provider, MD  B Complex-C (B-COMPLEX WITH VITAMIN C) tablet Take 1-2 tablets by mouth daily.   Yes Historical Provider, MD  bisoprolol-hydrochlorothiazide (ZIAC) 2.5-6.25 MG per tablet Take 1 tablet by mouth daily.   Yes Historical Provider, MD  Cholecalciferol (VITAMIN D3) 5000 UNITS CAPS Take 1 capsule by mouth daily.   Yes Historical Provider, MD  Ferrous Gluconate (IRON) 240 (27 FE) MG TABS Take 1 tablet by mouth daily.   Yes Historical Provider, MD  glimepiride (AMARYL) 1 MG tablet Take 1 mg by mouth daily with breakfast.   Yes Historical Provider, MD  labetalol (NORMODYNE) 200 MG tablet  Take 200 mg by mouth 2 (two) times daily.   Yes Historical Provider, MD  Magnesium 250 MG TABS Take 1 tablet by mouth daily.   Yes Historical Provider, MD  metFORMIN (GLUCOPHAGE-XR) 500 MG 24 hr tablet Take 500-1,000 mg by mouth 2 (two) times daily. 2 tablets in the morning 1 tablet in the evening.   Yes Historical Provider, MD  methocarbamol (ROBAXIN) 500 MG tablet Take 500 mg by mouth 3 (three) times daily.   Yes Historical Provider, MD  pravastatin (PRAVACHOL) 40 MG tablet Take 40 mg by mouth  daily.   Yes Historical Provider, MD  sitaGLIPtin (JANUVIA) 100 MG tablet Take 100 mg by mouth daily.   Yes Historical Provider, MD  vitamin B-12 (CYANOCOBALAMIN) 500 MCG tablet Take 500 mcg by mouth daily.   Yes Historical Provider, MD    FAMILY HISTORY: History reviewed. No pertinent family history.  SOCIAL HISTORY:  does not have a smoking history on file. She does not have any smokeless tobacco history on file. She reports that she does not drink alcohol or use illicit drugs.  REVIEW OF SYSTEMS:  Constitutional:   No  weight loss, night sweats,  Fevers, chills, fatigue.  HEENT:    No headaches, Difficulty swallowing,Tooth/dental problems,Sore throat,  No sneezing, itching, ear ache, nasal congestion, post nasal drip,   Cardio-vascular: No chest pain,  Orthopnea, PND, swelling in lower extremities, anasarca, dizziness, palpitations  GI:  No heartburn, indigestion, abdominal pain, nausea, vomiting, diarrhea, change in bowel habits, loss of appetite  Resp: No shortness of breath with exertion or at rest.  No excess mucus, no productive cough, No non-productive cough,  No coughing up of blood.No change in color of mucus.No wheezing.No chest wall deformity  Skin:  no rash or lesions.  GU:  no dysuria, change in color of urine, no urgency or frequency.  No flank pain.  Musculoskeletal: No joint pain or swelling.  No decreased range of motion.   Psych: No change in mood or affect. No depression or anxiety.  No memory loss.   PHYSICAL EXAM: Blood pressure 166/64, pulse 69, temperature 98.9 F (37.2 C), temperature source Oral, resp. rate 18, SpO2 94.00%.  General appearance :Awake, alert, not in any distress. Speech Clear. Not toxic Looking HEENT: Atraumatic and Normocephalic, pupils equally reactive to light and accomodation Neck: supple, no JVD. No cervical lymphadenopathy.  Chest:Good air entry bilaterally, no added sounds  CVS: S1 S2 regular, 3/6 systolic murmur    Abdomen: Bowel sounds present, Non tender and not distended with no gaurding, rigidity or rebound. Extremities: B/L Lower Ext shows no edema, both legs are warm to touch, with  dorsalis pedis pulses palpable. Neurology: Awake alert, and oriented X 3, CN II-XII intact, Non focal Wounds:N/A  LABS ON ADMISSION:   Basename 09/24/11 1349  NA 135  K 3.2*  CL 96  CO2 33*  GLUCOSE 212*  BUN 9  CREATININE 0.66  CALCIUM 9.6  MG --  PHOS --   No results found for this basename: AST:2,ALT:2,ALKPHOS:2,BILITOT:2,PROT:2,ALBUMIN:2 in the last 72 hours No results found for this basename: LIPASE:2,AMYLASE:2 in the last 72 hours  Basename 09/24/11 1349  WBC 7.5  NEUTROABS --  HGB 12.0  HCT 38.5  MCV 91.0  PLT 176    Basename 09/24/11 1351  CKTOTAL --  CKMB --  CKMBINDEX --  TROPONINI <0.30   No results found for this basename: DDIMER:2 in the last 72 hours No components found with this basename: POCBNP:3  RADIOLOGIC STUDIES ON ADMISSION: Dg Chest 2 View  09/24/2011  *RADIOLOGY REPORT*  Clinical Data: Cough  CHEST - 2 VIEW  Comparison: 04/13/2009  Findings: Low lung volumes with bibasilar atelectasis. No pleural effusion or pneumothorax.  Mild cardiomegaly.  Degenerative changes of the visualized thoracolumbar spine.  Prior lumbar vertebral augmentation.  Bilateral shoulder arthroplasties.  IMPRESSION: Low lung volumes with bibasilar atelectasis.  Mild cardiomegaly.  Original Report Authenticated By: Charline Bills, M.D.    ASSESSMENT AND PLAN: Present on Admission:  .Weakness -Not sure whether this is related to bradycardia from a increased vagal effect or this was a transient hypoglycemic episode. She does also have a urinary analysis that is suggestive of UTI  -For now we will admit her to a telemetry unit, as a check CBGs frequently and will treat with Rocephin for the UTI  -Further plan will depend as the patient's clinical course evolves   .Bradycardia -This is sinus  bradycardia-?related to increased vagal tone  -Recheck a TSH, cardiac enzymes  -Hold beta blockers -Cardiology has been consulted (Dr.Harwani-on call for Dr Algie Coffer) -check 2 D Echo  .UTI (lower urinary tract infection) -Rocephin  -Urine culture sensitivity   .Hematochezia -1 episode witnessed in the emergency room  -Will see if this recurs , if so then will check hemoglobin and hematocrit frequently. If recurs then we'll consult gastroenterology as well  -A meantime we'll hold off on starting aspirin or prophylactic Lovenox   .DM  -Hold Amaryl -possibility of hypoglycemic episode this morning  -Hold metformin -may need procedures done for bradycardia  -Check HbA1c  .HTN (hypertension) -Given bradycardia. Labetalol and bisoprolol  -Continue with amlodipine and benazepril  -Intravenous Hydralazine as needed   .Dyslipidemia -Continue with statin   Further plan will depend as patient's clinical course evolves and further radiologic and laboratory data become available. Patient will be monitored closely.  DVT Prophylaxis: SCDs   Code Status: full code  Total time spent for admission equals 45 minutes.  Jeoffrey Massed 09/24/2011, 4:30 PM

## 2011-09-24 NOTE — ED Notes (Signed)
Per pt she is just not feeling well.

## 2011-09-24 NOTE — ED Notes (Signed)
Patient transported to X-ray 

## 2011-09-25 LAB — HEMOGLOBIN A1C
Hgb A1c MFr Bld: 5.6 % (ref <5.7)
Mean Plasma Glucose: 114 mg/dL (ref <117)

## 2011-09-25 LAB — CARDIAC PANEL(CRET KIN+CKTOT+MB+TROPI)
CK, MB: 1.2 ng/mL (ref 0.3–4.0)
Relative Index: INVALID (ref 0.0–2.5)
Relative Index: INVALID (ref 0.0–2.5)
Total CK: 49 U/L (ref 7–177)
Total CK: 41 U/L (ref 7–177)
Troponin I: <0.3 ng/mL (ref <0.30)
Troponin I: <0.3 ng/mL (ref <0.30)

## 2011-09-25 LAB — COMPREHENSIVE METABOLIC PANEL
ALT: 10 U/L (ref 0–35)
AST: 18 U/L (ref 0–37)
Albumin: 2.8 g/dL — ABNORMAL LOW (ref 3.5–5.2)
Alkaline Phosphatase: 58 U/L (ref 39–117)
Chloride: 98 mEq/L (ref 96–112)
Potassium: 2.9 mEq/L — ABNORMAL LOW (ref 3.5–5.1)
Sodium: 137 mEq/L (ref 135–145)
Total Bilirubin: 0.4 mg/dL (ref 0.3–1.2)
Total Protein: 6.1 g/dL (ref 6.0–8.3)

## 2011-09-25 LAB — CBC
MCHC: 32.2 g/dL (ref 30.0–36.0)
Platelets: 170 10*3/uL (ref 150–400)
RDW: 14.8 % (ref 11.5–15.5)
WBC: 10.8 10*3/uL — ABNORMAL HIGH (ref 4.0–10.5)

## 2011-09-25 LAB — PROTIME-INR: Prothrombin Time: 13.1 seconds (ref 11.6–15.2)

## 2011-09-25 MED ORDER — POTASSIUM CHLORIDE CRYS ER 20 MEQ PO TBCR
20.0000 meq | EXTENDED_RELEASE_TABLET | Freq: Three times a day (TID) | ORAL | Status: DC
  Administered 2011-09-25 – 2011-09-26 (×4): 20 meq via ORAL
  Filled 2011-09-25 (×6): qty 1

## 2011-09-25 MED ORDER — KCL IN DEXTROSE-NACL 40-5-0.45 MEQ/L-%-% IV SOLN
INTRAVENOUS | Status: DC
  Administered 2011-09-25 – 2011-09-28 (×3): via INTRAVENOUS
  Filled 2011-09-25 (×4): qty 1000

## 2011-09-25 MED ORDER — ISOSORB DINITRATE-HYDRALAZINE 20-37.5 MG PO TABS
1.0000 | ORAL_TABLET | Freq: Two times a day (BID) | ORAL | Status: DC
  Administered 2011-09-25 – 2011-09-30 (×10): 1 via ORAL
  Filled 2011-09-25 (×11): qty 1

## 2011-09-25 NOTE — Progress Notes (Signed)
*  PRELIMINARY RESULTS* Echocardiogram 2D Echocardiogram has been performed.  Glean Salen [facility] 09/25/2011, 1:10 PM

## 2011-09-25 NOTE — ED Provider Notes (Signed)
Medical screening examination/treatment/procedure(s) were conducted as a shared visit with non-physician practitioner(s) and myself.  I personally evaluated the patient during the encounter. Generalized weakness. Patient had blood in her stool has apparent urinary tract infection. Her hemoglobin is reassuring at this time. Her blood pressures been good. She'll be admitted for further evaluation  Juliet Rude. Rubin Payor, MD 09/25/11 0700

## 2011-09-25 NOTE — Progress Notes (Signed)
Patient ID: Heidi Dominguez, female   DOB: [birth year], 78 y.o.   MRN: [id number]  Subjective: No events overnight. Patient denies chest pain, shortness of breath, abdominal pain.   Objective:  Vital signs in last 24 hours:  Filed Vitals:   09/25/11 0148 09/25/11 0504 09/25/11 1123 09/25/11 1406  BP: 160/67 142/59 159/70 141/64  Pulse: 100 88 81 84  Temp: 100.7 F (38.2 C) 99.9 F (37.7 C) 99.2 F (37.3 C) 99.6 F (37.6 C)  TempSrc: Oral Oral Oral Oral  Resp: 16 18 18 18   Height:      Weight:  78.8 kg (173 lb 11.6 oz)    SpO2: 91% 92% 93% 95%    Intake/Output from previous day:   Intake/Output Summary (Last 24 hours) at 09/25/11 2024 Last data filed at 09/25/11 1811  Gross per 24 hour  Intake 1055.33 ml  Output      0 ml  Net 1055.33 ml    Physical Exam: General: Alert, awake, oriented x3, in no acute distress. HEENT: No bruits, no goiter. Moist mucous membranes, no scleral icterus, no conjunctival pallor. Heart: Regular rate and rhythm, S1/S2 + Lungs: Clear to auscultation bilaterally. No wheezing, no rhonchi, no rales.  Abdomen: Soft, nontender, nondistended, positive bowel sounds. Extremities: No clubbing or cyanosis, no pitting edema,  positive pedal pulses. Neuro: Grossly nonfocal.  Lab Results:  Basic Metabolic Panel:    Component Value Date/Time   NA 137 09/25/2011 0152   K 2.9* 09/25/2011 0152   CL 98 09/25/2011 0152   CO2 32 09/25/2011 0152   BUN 8 09/25/2011 0152   CREATININE 0.60 09/25/2011 0152   GLUCOSE 168* 09/25/2011 0152   CALCIUM 9.2 09/25/2011 0152   CBC:    Component Value Date/Time   WBC 10.8* 09/25/2011 0152   HGB 11.9* 09/25/2011 0152   HCT 36.9 09/25/2011 0152   PLT 170 09/25/2011 0152   MCV 88.9 09/25/2011 0152   NEUTROABS 7.8* 06/04/2011 1400   LYMPHSABS 1.2 06/04/2011 1400   MONOABS 0.9 06/04/2011 1400   EOSABS 0.1 06/04/2011 1400   BASOSABS 0.0 06/04/2011 1400      Lab 09/25/11 0152 09/24/11 1349  WBC 10.8* 7.5  HGB 11.9* 12.0    HCT 36.9 38.5  PLT 170 176  MCV 88.9 91.0  MCH 28.7 28.4  MCHC 32.2 31.2  RDW 14.8 14.9  LYMPHSABS -- --  MONOABS -- --  EOSABS -- --  BASOSABS -- --  BANDABS -- --    Lab 09/25/11 0152 09/24/11 1349  NA 137 135  K 2.9* 3.2*  CL 98 96  CO2 32 33*  GLUCOSE 168* 212*  BUN 8 9  CREATININE 0.60 0.66  CALCIUM 9.2 9.6  MG -- --    Lab 09/25/11 0152  INR 0.97  PROTIME --   Cardiac markers:  Lab 09/25/11 0900 09/25/11 0152 09/24/11 1732  CKMB 1.2 1.3 1.7  TROPONINI <0.30 <0.30 <0.30  MYOGLOBIN -- -- --   No components found with this basename: POCBNP:3 Recent Results (from the past 240 hour(s))  URINE CULTURE     Status: Normal (Preliminary result)   Collection Time   09/24/11  1:48 PM      Component Value Range Status Comment   Specimen Description URINE, CATHETERIZED   Final    Special Requests NONE   Final    Culture  Setup Time [id number]   Final    Colony Count >=100,000 COLONIES/ML   Final    Culture Romie Minus  NEGATIVE RODS   Final    Report Status PENDING   Incomplete   STOOL CULTURE     Status: Normal (Preliminary result)   Collection Time   09/24/11  3:00 PM      Component Value Range Status Comment   Specimen Description STOOL   Final    Special Requests Normal   Final    Culture Culture reincubated for better growth   Final    Report Status PENDING   Incomplete     Studies/Results: Dg Chest 2 View  09/24/2011  *RADIOLOGY REPORT*  Clinical Data: Cough  CHEST - 2 VIEW  Comparison: 04/13/2009  Findings: Low lung volumes with bibasilar atelectasis. No pleural effusion or pneumothorax.  Mild cardiomegaly.  Degenerative changes of the visualized thoracolumbar spine.  Prior lumbar vertebral augmentation.  Bilateral shoulder arthroplasties.  IMPRESSION: Low lung volumes with bibasilar atelectasis.  Mild cardiomegaly.  Original Report Authenticated By: Charline Bills, M.D.    Medications: Scheduled Meds:   . amLODipine  5 mg Oral Daily   And  . benazepril   20 mg Oral Daily  . B-complex with vitamin C  1-2 tablet Oral Daily  . cefTRIAXone (ROCEPHIN)  IV  1 g Intravenous Q24H  . cholecalciferol  5,000 Units Oral Daily  . vitamin B-12  500 mcg Oral Daily  . docusate sodium  100 mg Oral BID  . ferrous gluconate  324 mg Oral Q breakfast  . insulin aspart  0-9 Units Subcutaneous TID WC  . linagliptin  5 mg Oral Q breakfast  . senna  1 tablet Oral BID  . simvastatin  20 mg Oral q1800  . sodium chloride  3 mL Intravenous Q12H   Continuous Infusions:   . sodium chloride 40 mL/hr at 09/24/11 1837   PRN Meds:.acetaminophen, acetaminophen, albuterol, alum & mag hydroxide-simeth, hydrALAZINE, morphine, ondansetron (ZOFRAN) IV, ondansetron, traMADol  Assessment/Plan:  Principal Problem:   *Bradycardia - secondary to beta blockers - follow up cardiology recommendations  Active Problems:    UTI (lower urinary tract infection) - continue Rocefin - follow up results of urine culture   Hypokalemia - repleted - check BMP in am  DM (diabetes mellitus) - continue linagliptin - continue sliding scale insulin   HTN (hypertension) - Continue norvasc and benazepril - beta blockers on hold   Dyslipidemia - continue simvastatin   EDUCATION - test results and diagnostic studies were discussed with patient  at the bedside - patient has verbalized the understanding - questions were answered at the bedside and contact information was provided for additional questions or concerns   LOS: 1 day   Amiri Tritch 09/25/2011, 8:24 PM  [facility] Pager: [phone]

## 2011-09-25 NOTE — Progress Notes (Signed)
Physical Therapy Evaluation Patient Details Name: Heidi Dominguez MRN: [id number] DOB: [birth year] Today's Date: 09/25/2011  Problem List:  Patient Active Problem List  Diagnoses  . Weakness  . Bradycardia  . UTI (lower urinary tract infection)  . Hematochezia  . DM (diabetes mellitus)  . HTN (hypertension)  . Dyslipidemia    Past Medical History:  Past Medical History  Diagnosis Date  . Diabetes mellitus   . Hypertension   . High cholesterol   . Osteoarthritis    Past Surgical History: History reviewed. No pertinent past surgical history.  PT Assessment/Plan/Recommendation PT Assessment Clinical Impression Statement:   78 yo female admittted with syncopal episode (possible hypoglycemic event/bradycardia/vagal event/?GI bleeding) presents to PT with decreased functional mobility and impairments listed below;   Will benefit from PT to maximize independence and safety with mobility/transfers/amb/facilitate dc planning;   If for home, pt must be modified Independent and safe by herself for large portions of the day PT Recommendation/Assessment: Patient will need skilled PT in the acute care venue PT Problem List:  Decreased strength; Decreased range of motion; Decreased activity tolerance Decreased balance; Decreased mobility; Decreased coordination; Decreased cognition; Decreased knowledge of use of DME; Decreased safety awareness; Decreased knowledge of precautions; Pain; Cardiopulmonary status limiting activity Barriers to Discharge: Decreased caregiver support Barriers to Discharge Comments: Will need to verify pt's home situation, PLOF, and exactly how much assist is available to her PT Therapy Diagnosis : Difficulty walking;Generalized weakness;Acute pain PT Plan PT Frequency: Min 3X/week PT Treatment/Interventions: DME instruction;Gait training;Functional mobility training;Therapeutic activities;Therapeutic exercise;Balance training;Cognitive  remediation;Patient/family education PT Recommendation Recommendations for Other Services: OT consult; Speech Therapy to assess cognition (Worth considering SW for possible SNF -- with a caveat: Pt's son, Heidi Dominguez pretty clearly did not want to discuss dc planning during PT eval, he is much more concerned with pt's medical status at this time) Follow Up Recommendations: Home health PT;Supervision/Assistance - 24 hour;Skilled nursing facility;Inpatient Rehab (Will depend on progress/hospital course) Equipment Recommended:  (pt wants another rollator in May (when Medicare will replace) PT Goals  Acute Rehab PT Goals PT Goal Formulation: With patient Time For Goal Achievement: 2 weeks Pt will go Supine/Side to Sit: with modified independence PT Goal: Supine/Side to Sit - Progress: Goal set today Pt will go Sit to Supine/Side: with modified independence PT Goal: Sit to Supine/Side - Progress: Goal set today Pt will go Sit to Stand: with modified independence PT Goal: Sit to Stand - Progress: Goal set today Pt will go Stand to Sit: with modified independence PT Goal: Stand to Sit - Progress: Goal set today Pt will Transfer Bed to Chair/Chair to Bed: with modified independence PT Transfer Goal: Bed to Chair/Chair to Bed - Progress: Goal set today Pt will Ambulate: 51 - 150 feet;with modified independence;with rolling walker PT Goal: Ambulate - Progress: Goal set today  PT Evaluation Precautions/Restrictions  Precautions Precautions: Fall Restrictions Other Position/Activity Restrictions: Pt with bloody loose stool during PT eval; would recommend using a Depend-like brief for mobilizing/ambulation, and then take it off as pt's son is adamant about pt not wearing pull-up-type briefs Prior Functioning  Home Living Lives With: Family (Granddaughter who works days) Actor Help From: Family;Personal care attendant (3 days/wk; helps with bathing, cooking, housekeeping) Type of Home: House Home  Layout: One level Home Access: Ramped entrance Home Adaptive Equipment:  (Will need to verify home adaptive equipment) Additional Comments: Not exactly sure how reliable a historian Heidi Dominguez is; will need to verify, but it sounds like  Heidi Dominguez must be safe and modified independent to dc home Prior Function Level of Independence: Independent with transfers;Requires assistive device for independence;Needs assistance with ADLs;Needs assistance with homemaking (Back pain with all mobility) Cognition Cognition Arousal/Alertness: Awake/alert Overall Cognitive Status: Appears within functional limits for tasks assessed (for simple mobility activities) Orientation Level: Oriented X4 Cognition - Other Comments: Pt requested pull-up-brief prior to getting up; Pt's son was so adamant about no briefs that it brings into question if the pt and son had talked about no briefs, and she didn't remember Sensation/Coordination Sensation Light Touch: Appears Intact Coordination Gross Motor Movements are Fluid and Coordinated: No Fine Motor Movements are Fluid and Coordinated: Not tested Coordination and Movement Description: Movement limited by chronic back pain Extremity Assessment RUE Assessment RUE Assessment:  (gross decr. range with previous shoulder surgery) LUE Assessment LUE Assessment:  (gross decr. range with previous shoulder surgery) RLE Assessment RLE Assessment: Exceptions to [facility] RLE Strength RLE Overall Strength Comments: gross decr. range with previous shoulder surgery LLE Assessment LLE Assessment: Exceptions to [facility] LLE Strength LLE Overall Strength Comments: gross decr. range with previous shoulder surgery Mobility (including Balance) Bed Mobility Bed Mobility: Yes Rolling Right: 3: Mod assist;With rail Rolling Right Details (indicate cue type and reason): Mod assist to complet roll into full sidelying Rolling Left: 3: Mod assist;With rail Rolling Left Details (indicate cue  type and reason): Physical assist to get into full sidelying; Assisted pt with rolling to help with cleanup/hygeine Supine to Sit: 3: Mod assist Supine to Sit Details (indicate cue type and reason): Step-by-step cues for technique; facilitation at pelvic girdle to complete motion; most of motion painful Sitting - Scoot to Edge of Bed: 3: Mod assist Sitting - Scoot to Edge of Bed Details (indicate cue type and reason): cues and physical assist to weight shift for scooting Sit to Supine: 3: Mod assist Sit to Supine - Details (indicate cue type and reason): Physical assist required for bil LEs; most of motion painful Transfers Transfers: Yes Sit to Stand: 3: Mod assist;From bed Sit to Stand Details (indicate cue type and reason):   mod assist for initial liftoff of bed; audible crepitus, likely from hips; cues for safe hand placement;   Upon standing, we were working on pulling up brief when the pt began stooling -- a large amount of loose stool with apparent blood, so assisted pt back to bed, notified RN, who joined me to help cleanup the pt Stand to Sit: 3: Mod assist Stand to Sit Details: Mod assist to control stand to sit back to bed Ambulation/Gait Ambulation/Gait: No     End of Session PT - End of Session Activity Tolerance: Treatment limited secondary to medical complications (Comment) (Bright red blood per rectum) Patient left: in bed;with call bell in reach;with family/visitor present Nurse Communication: Mobility status for transfers General Behavior During Session: [facility] for tasks performed Cognition: Impaired Cognitive Impairment: Will need further assessment; likely memory defecits; Decreased awareness of functional limitations at this point  Van Clines [facility] [place], [place] [phone]  09/25/2011, 5:03 PM

## 2011-09-25 NOTE — Evaluation (Signed)
Clinical/Bedside Swallow Evaluation Patient Details  Name: Heidi Dominguez MRN: [id number] DOB: 13-Mar-1921 Today's Date: 09/25/2011  Past Medical History:  Past Medical History  Diagnosis Date  . Diabetes mellitus   . Hypertension   . High cholesterol   . Osteoarthritis    Past Surgical History: History reviewed. No pertinent past surgical history. HPI:  78 y.o. female admitted from home on 2/10 with weakness, thought to be due to hypoglycemia.  Patient has been tolerating PO diet, however reported to MD today difficulty with lunch "not going down."  Bedside swallow evaluation ordered to determine nature of problem.`   Assessment/Recommendations/Treatment Plan Suspected Esophageal Findings Suspected Esophageal Findings: Belching;Globus sensation  SLP Assessment Clinical Impression Statement: Demonstrates a likely primary esophageal phase dysphagia given patient report of globus, "it's stuck in my chest" and discomfort with swallowing.  Other observed signs include belching post swallow of thin liquids when SLP suspected esophageal stasis.  Patient reports "they stretched it once" indicative of esophageal issues in the past.  Suggest patient follow-up with her GI physician (Dr. Madilyn Fireman) upon D/C to home to reassess need for possible dilitation again.  Risk for Aspiration: Mild (Due to esophageal based issues) Other Related Risk Factors: History of esophageal-related issues  Swallow Evaluation Recommendations Solid Consistency: Regular Liquid Consistency: Thin Liquid Administration via: Cup;Straw Medication Administration: Whole meds with liquid Supervision: Patient able to self feed Compensations: Small sips/bites;Slow rate;Follow solids with liquid Postural Changes and/or Swallow Maneuvers: Seated upright 90 degrees;Upright 30-60 min after meal Oral Care Recommendations: Oral care BID;Patient independent with oral care Follow up Recommendations: None  Treatment Plan Treatment Plan  Recommendations: No treatment recommended at this time    Individuals Consulted Consulted and Agree with Results and Recommendations: Patient;Family member/caregiver;RN Family Member Consulted: Daughter  Swallow Evaluation Prior Functional Status   General  Date of Onset: 09/24/11 Type of Study: Bedside swallow evaluation Diet Prior to this Study: Regular;Thin liquids Temperature Spikes Noted: No Respiratory Status: Room air History of Intubation: No Behavior/Cognition: Alert;Cooperative;Pleasant mood Oral Cavity - Dentition: Dentures, bottom;Dentures, top Vision: Functional for self-feeding Patient Positioning: Upright in bed Baseline Vocal Quality: Clear Volitional Cough: Strong Volitional Swallow: Able to elicit  Oral Motor/Sensory Function  Overall Oral Motor/Sensory Function: Appears within functional limits for tasks assessed  Consistency Results  Ice Chips Ice chips: Not tested  Thin Liquid Thin Liquid: Within functional limits Presentation: Self Fed;Cup;Straw  Nectar Thick Liquid Nectar Thick Liquid: Not tested  Honey Thick Liquid Honey Thick Liquid: Not tested  Puree Puree: Within functional limits Presentation: Self Fed;Spoon  Solid Solid: Within functional limits Presentation: Self Fed Other Comments: C/o globus with dry cracker; liquid sips revealed discomfort and belching   Myra Rude, M.S.,CCC-SLP Pager [phone]  09/25/2011,2:17 PM

## 2011-09-25 NOTE — Progress Notes (Signed)
Subjective:  Difficulty swallowing. Refusing cardiac interventions for now. H/O dysphagia with esophageal dilatation/Dr. Madilyn Fireman  Objective:  Vital Signs in the last 24 hours: Temp:  [98.3 F (36.8 C)-100.7 F (38.2 C)] 98.3 F (36.8 C) (02/11 2100) Pulse Rate:  [81-100] 92  (02/11 2100) Cardiac Rhythm:  [-] Normal sinus rhythm (02/11 2000) Resp:  [16-20] 20  (02/11 2100) BP: (141-188)/(59-91) 188/91 mmHg (02/11 2100) SpO2:  [91 %-96 %] 96 % (02/11 2100) Weight:  [78.8 kg (173 lb 11.6 oz)] 78.8 kg (173 lb 11.6 oz) (02/11 0504)  Physical Exam: BP Readings from Last 1 Encounters:  09/25/11 188/91    Wt Readings from Last 1 Encounters:  09/25/11 78.8 kg (173 lb 11.6 oz)    Weight change:   HEENT: [place]/AT, Eyes-Brown, PERL, EOMI, Conjunctiva-Pink, Sclera-Non-icteric, Wears glasses Neck: No JVD, No bruit, Trachea midline. Lungs:  Clear, Bilateral. Cardiac:  Regular rhythm, normal S1 and S2, no S3. III/VI systolic murmur Abdomen:  Soft, non-tender. Extremities:  No edema present. No cyanosis. No clubbing. CNS: AxOx3, Cranial nerves grossly intact, moves all 4 extremities. Right handed. Skin: Warm and dry.   Intake/Output from previous day: 02/10 0701 - 02/11 0700 In: 455.3 [P.O.:240; I.V.:165.3; IV Piggyback:50] Out: -     Lab Results: BMET    Component Value Date/Time   NA 137 09/25/2011 0152   K 2.9* 09/25/2011 0152   CL 98 09/25/2011 0152   CO2 32 09/25/2011 0152   GLUCOSE 168* 09/25/2011 0152   BUN 8 09/25/2011 0152   CREATININE 0.60 09/25/2011 0152   CALCIUM 9.2 09/25/2011 0152   GFRNONAA 79* 09/25/2011 0152   GFRAA >90 09/25/2011 0152   CBC    Component Value Date/Time   WBC 10.8* 09/25/2011 0152   RBC 4.15 09/25/2011 0152   HGB 11.9* 09/25/2011 0152   HCT 36.9 09/25/2011 0152   PLT 170 09/25/2011 0152   MCV 88.9 09/25/2011 0152   MCH 28.7 09/25/2011 0152   MCHC 32.2 09/25/2011 0152   RDW 14.8 09/25/2011 0152   LYMPHSABS 1.2 06/04/2011 1400   MONOABS 0.9 06/04/2011 1400   EOSABS 0.1 06/04/2011 1400   BASOSABS 0.0 06/04/2011 1400   CARDIAC ENZYMES Lab Results  Component Value Date   CKTOTAL 41 09/25/2011   CKMB 1.2 09/25/2011   TROPONINI <0.30 09/25/2011    Assessment/Plan:  Patient Active Hospital Problem List: Bradycardia (09/24/2011)   Assessment: Improving off B-blocker   Plan: Hold B-blocker UTI (lower urinary tract infection) (09/24/2011)   Assessment: Stable   Plan: Continue antibiotic Hematochezia (09/24/2011)   Assessment: Stable   Plan: refer to GI DM (diabetes mellitus) (09/24/2011)   Assessment: Stable   Plan: Continue medications HTN (hypertension) (09/24/2011)   Assessment: Not controlled   Plan: Add Bidil Heart Murmur   2-D echo- EF 50-55 %, moderate LVH,    Calcific AV with Very mild AS, Mild MR and TR. Dyslipidemia (09/24/2011)   Assessment: Stable Dysphagia   Speech therapy evaluation appreciated.   Refer to GI. Hypokalemia   K+ replacement    LOS: 1 day    Orpah Cobb  MD  09/25/2011, 10:13 PM

## 2011-09-26 LAB — CBC
HCT: 33.1 % — ABNORMAL LOW (ref 36.0–46.0)
Hemoglobin: 10.4 g/dL — ABNORMAL LOW (ref 12.0–15.0)
MCH: 28.2 pg (ref 26.0–34.0)
MCHC: 31.4 g/dL (ref 30.0–36.0)
MCV: 88.6 fL (ref 78.0–100.0)
Platelets: 168 10*3/uL (ref 150–400)
RBC: 3.69 MIL/uL — ABNORMAL LOW (ref 3.87–5.11)
RBC: 3.59 MIL/uL — ABNORMAL LOW (ref 3.87–5.11)
RDW: 14.7 % (ref 11.5–15.5)
WBC: 14.6 10*3/uL — ABNORMAL HIGH (ref 4.0–10.5)

## 2011-09-26 LAB — BASIC METABOLIC PANEL
BUN: 9 mg/dL (ref 6–23)
CO2: 32 mEq/L (ref 19–32)
Calcium: 8.9 mg/dL (ref 8.4–10.5)
GFR calc non Af Amer: 44 mL/min — ABNORMAL LOW (ref >90)
Glucose, Bld: 177 mg/dL — ABNORMAL HIGH (ref 70–99)
Potassium: 3 mEq/L — ABNORMAL LOW (ref 3.5–5.1)

## 2011-09-26 LAB — URINE CULTURE
Culture  Setup Time: 201302101830
Culture  Setup Time: 201302110837

## 2011-09-26 LAB — GLUCOSE, CAPILLARY
Glucose-Capillary: 164 mg/dL — ABNORMAL HIGH (ref 70–99)
Glucose-Capillary: 245 mg/dL — ABNORMAL HIGH (ref 70–99)
Glucose-Capillary: 138 mg/dL — ABNORMAL HIGH (ref 70–99)

## 2011-09-26 MED ORDER — POTASSIUM CHLORIDE 10 MEQ/100ML IV SOLN
10.0000 meq | INTRAVENOUS | Status: AC
Start: 2011-09-26 — End: 2011-09-27
  Administered 2011-09-26 (×3): 10 meq via INTRAVENOUS
  Filled 2011-09-26 (×3): qty 100

## 2011-09-26 NOTE — Plan of Care (Signed)
Problem: Phase I Progression Outcomes Goal: EF % per last Echo/documented,Core Reminder form on chart Outcome: Completed/Met Date Met:  09/26/11 EF 50-55% per ECHO performed on 09/25/11.

## 2011-09-26 NOTE — Progress Notes (Signed)
Patient ID: TWINKLE SOCKWELL, female   DOB: 02-03-1921, 78 y.o.   MRN: [id number]  Assessment/Plan:   Principal Problem:   *Bradycardia  - secondary to beta blockers  - beta blockers held and it seems that it helps as HR is 84-92 - follow up cardiology recommendations   Active Problems:   UTI (lower urinary tract infection)  - continue Rocefin  - follow up results of urine culture - Citrobacter, awaiting sensitivities  HEMATOCHEZIA - likely secondary to diverticular bleed versus ischemic colitis versus hemorrhoidal bleed - hemoglobin stable and GI in favor of observation - the patient is up to date with colonoscopies  Hypokalemia  - repleted again today - check BMP in am   DM (diabetes mellitus)  - continue linagliptin  - continue sliding scale insulin   HTN (hypertension)  - Continue norvasc and benazepril  - beta blockers on hold   Dyslipidemia  - continue simvastatin   EDUCATION  - test results and diagnostic studies were discussed with patient at the bedside  - patient has verbalized the understanding  - questions were answered at the bedside and contact information was provided for additional questions or concerns   PT EVALUATION  Subjective: No events overnight. Patient denies chest pain, shortness of breath, abdominal pain. Had bowel movement and reports ambulating.  Objective:  Vital signs in last 24 hours:  Filed Vitals:   09/25/11 2100 09/25/11 2305 09/26/11 0545 09/26/11 1451  BP: 188/91 144/64 118/56 134/58  Pulse: 92 92 86 84  Temp: 98.3 F (36.8 C)  98.5 F (36.9 C) 98.7 F (37.1 C)  TempSrc: Oral  Oral Oral  Resp: 20  20 20   Height:      Weight:   78.2 kg (172 lb 6.4 oz)   SpO2: 96%  94% 95%    Intake/Output from previous day:   Intake/Output Summary (Last 24 hours) at 09/26/11 2114 Last data filed at 09/26/11 1900  Gross per 24 hour  Intake 1885.67 ml  Output      0 ml  Net 1885.67 ml    Physical Exam: General: Alert, awake,  oriented x3, in no acute distress. HEENT: No bruits, no goiter. Moist mucous membranes, no scleral icterus, no conjunctival pallor. Heart: Regular rate and rhythm, S1/S2 +, no murmurs, rubs, gallops. Lungs: Clear to auscultation bilaterally. No wheezing, no rhonchi, no rales.  Abdomen: Soft, nontender, nondistended, positive bowel sounds. Extremities: No clubbing or cyanosis, no pitting edema,  positive pedal pulses. Neuro: Grossly nonfocal.  Lab Results:  Basic Metabolic Panel:    Component Value Date/Time   NA 137 09/26/2011 0545   K 3.0* 09/26/2011 0545   CL 99 09/26/2011 0545   CO2 32 09/26/2011 0545   BUN 9 09/26/2011 0545   CREATININE 1.09 09/26/2011 0545   GLUCOSE 177* 09/26/2011 0545   CALCIUM 8.9 09/26/2011 0545   CBC:    Component Value Date/Time   WBC 14.6* 09/26/2011 1625   HGB 10.2* 09/26/2011 1625   HCT 31.8* 09/26/2011 1625   PLT 168 09/26/2011 1625   MCV 88.6 09/26/2011 1625   NEUTROABS 7.8* 06/04/2011 1400   LYMPHSABS 1.2 06/04/2011 1400   MONOABS 0.9 06/04/2011 1400   EOSABS 0.1 06/04/2011 1400   BASOSABS 0.0 06/04/2011 1400      Lab 09/26/11 1625 09/26/11 0545 09/25/11 0152 09/24/11 1349  WBC 14.6* 15.7* 10.8* 7.5  HGB 10.2* 10.4* 11.9* 12.0  HCT 31.8* 33.1* 36.9 38.5  PLT 168 164 170 176  MCV 88.6  89.7 88.9 91.0  MCH 28.4 28.2 28.7 28.4  MCHC 32.1 31.4 32.2 31.2  RDW 14.7 14.9 14.8 14.9  LYMPHSABS -- -- -- --  MONOABS -- -- -- --  EOSABS -- -- -- --  BASOSABS -- -- -- --  BANDABS -- -- -- --    Lab 09/26/11 0545 09/25/11 0152 09/24/11 1349  NA 137 137 135  K 3.0* 2.9* 3.2*  CL 99 98 96  CO2 32 32 33*  GLUCOSE 177* 168* 212*  BUN 9 8 9   CREATININE 1.09 0.60 0.66  CALCIUM 8.9 9.2 9.6  MG -- -- --    Lab 09/25/11 0152  INR 0.97  PROTIME --   Cardiac markers:  Lab 09/25/11 0900 09/25/11 0152 09/24/11 1732  CKMB 1.2 1.3 1.7  TROPONINI <0.30 <0.30 <0.30  MYOGLOBIN -- -- --   No components found with this basename: POCBNP:3 Recent Results  (from the past 240 hour(s))  URINE CULTURE     Status: Normal   Collection Time   09/24/11  1:48 PM      Component Value Range Status Comment   Specimen Description URINE, CATHETERIZED   Final    Special Requests NONE   Final    Culture  Setup Time [id number]   Final    Colony Count >=100,000 COLONIES/ML   Final    Culture CITROBACTER FREUNDII   Final    Report Status 09/26/2011 FINAL   Final    Organism ID, Bacteria CITROBACTER FREUNDII   Final   STOOL CULTURE     Status: Normal (Preliminary result)   Collection Time   09/24/11  3:00 PM      Component Value Range Status Comment   Specimen Description STOOL   Final    Special Requests Normal   Final    Culture NO SUSPICIOUS COLONIES, CONTINUING TO HOLD   Final    Report Status PENDING   Incomplete   URINE CULTURE     Status: Normal   Collection Time   09/24/11  8:50 PM      Component Value Range Status Comment   Specimen Description URINE, CATHETERIZED   Final    Special Requests NONE   Final    Culture  Setup Time [id number]   Final    Colony Count 2,000 COLONIES/ML   Final    Culture INSIGNIFICANT GROWTH   Final    Report Status 09/26/2011 FINAL   Final     Studies/Results: No results found.  Medications: Scheduled Meds:   . amLODipine  5 mg Oral Daily   And  . benazepril  20 mg Oral Daily  . B-complex with vitamin C  1-2 tablet Oral Daily  . cefTRIAXone (ROCEPHIN)  IV  1 g Intravenous Q24H  . cholecalciferol  5,000 Units Oral Daily  . vitamin B-12  500 mcg Oral Daily  . docusate sodium  100 mg Oral BID  . ferrous gluconate  324 mg Oral Q breakfast  . insulin aspart  0-9 Units Subcutaneous TID WC  . isosorbide-hydrALAZINE  1 tablet Oral BID  . linagliptin  5 mg Oral Q breakfast  . potassium chloride  20 mEq Oral TID  . senna  1 tablet Oral BID  . simvastatin  20 mg Oral q1800  . sodium chloride  3 mL Intravenous Q12H   Continuous Infusions:   . dextrose 5 % and 0.45 % NaCl with KCl 40 mEq/L 40 mL/hr at  09/25/11 2309  . DISCONTD: sodium chloride 40  mL/hr at 09/24/11 1837   PRN Meds:.acetaminophen, acetaminophen, albuterol, alum & mag hydroxide-simeth, hydrALAZINE, morphine, ondansetron (ZOFRAN) IV, ondansetron, traMADol    LOS: 2 days   Peggye Poon 09/26/2011, 9:14 PM  [facility] Pager: [phone]

## 2011-09-26 NOTE — Progress Notes (Signed)
Subjective:  Feeling little better. No chest pain.  Objective:  Vital Signs in the last 24 hours: Temp:  [98.3 F (36.8 C)-99.6 F (37.6 C)] 98.5 F (36.9 C) (02/12 0545) Pulse Rate:  [81-92] 86  (02/12 0545) Cardiac Rhythm:  [-] Normal sinus rhythm (02/12 0900) Resp:  [18-20] 20  (02/12 0545) BP: (118-188)/(56-91) 118/56 mmHg (02/12 0545) SpO2:  [93 %-96 %] 94 % (02/12 0545) Weight:  [78.2 kg (172 lb 6.4 oz)] 78.2 kg (172 lb 6.4 oz) (02/12 0545)  Physical Exam: BP Readings from Last 1 Encounters:  09/26/11 118/56    Wt Readings from Last 1 Encounters:  09/26/11 78.2 kg (172 lb 6.4 oz)    Weight change: 2.359 kg (5 lb 3.2 oz)  HEENT: [place]/AT, Eyes-Brown, PERL, EOMI, Conjunctiva-Pink, Sclera-Non-icteric Neck: No JVD, No bruit, Trachea midline. Lungs:  Clear, Bilateral. Cardiac:  Regular rhythm, normal S1 and S2, no S3.  Abdomen:  Soft, non-tender. Extremities:  No edema present. No cyanosis. No clubbing. CNS: AxOx3, Cranial nerves grossly intact, moves all 4 extremities. Right handed. Skin: Warm and dry.   Intake/Output from previous day: 02/11 0701 - 02/12 0700 In: 2383 [P.O.:1040; I.V.:1293; IV Piggyback:50] Out: -     Lab Results: BMET    Component Value Date/Time   NA 137 09/26/2011 0545   K 3.0* 09/26/2011 0545   CL 99 09/26/2011 0545   CO2 32 09/26/2011 0545   GLUCOSE 177* 09/26/2011 0545   BUN 9 09/26/2011 0545   CREATININE 1.09 09/26/2011 0545   CALCIUM 8.9 09/26/2011 0545   GFRNONAA 44* 09/26/2011 0545   GFRAA 51* 09/26/2011 0545   CBC    Component Value Date/Time   WBC 15.7* 09/26/2011 0545   RBC 3.69* 09/26/2011 0545   HGB 10.4* 09/26/2011 0545   HCT 33.1* 09/26/2011 0545   PLT 164 09/26/2011 0545   MCV 89.7 09/26/2011 0545   MCH 28.2 09/26/2011 0545   MCHC 31.4 09/26/2011 0545   RDW 14.9 09/26/2011 0545   LYMPHSABS 1.2 06/04/2011 1400   MONOABS 0.9 06/04/2011 1400   EOSABS 0.1 06/04/2011 1400   BASOSABS 0.0 06/04/2011 1400   CARDIAC ENZYMES Lab Results    Component Value Date   CKTOTAL 41 09/25/2011   CKMB 1.2 09/25/2011   TROPONINI <0.30 09/25/2011    Assessment/Plan:  Patient Active Hospital Problem List: Bradycardia (09/24/2011) Assessment: Improving off B-blocker Plan: Hold B-blocker UTI (lower urinary tract infection) (09/24/2011) Assessment: Stable Plan: Continue antibiotic Hematochezia (09/24/2011) Assessment: ? Stable Plan: Monitor H/H per GI/Dr. Buccini DM, II (diabetes mellitus) (09/24/2011) Assessment: Stable Plan: Continue medications HTN (hypertension) (09/24/2011) Assessment: Not controlled Plan: Add Bidil  Heart Murmur  2-D echo- EF 50-55 %, moderate LVH,  Calcific AV with Very mild AS, Mild MR and TR. Dyslipidemia (09/24/2011) Assessment: Stable Dysphagia  Speech therapy evaluation appreciated.  Refer to GI.  Hypokalemia  K+ replacement    LOS: 2 days    Orpah Cobb  MD  09/26/2011, 10:04 AM

## 2011-09-26 NOTE — Consult Note (Signed)
Referring Provider: Dr Orpah Cobb Primary Care Physician:  Reather Littler, MD, MD Primary Gastroenterologist:  Dr. Dorena Cookey  Reason for Consultation:  Hematochezia  HPI: Heidi Dominguez is a 78 y.o. female admitted 2 days ago because of weakness, and found to be significantly bradycardic. That has not been a problem since admission, since her data blocker has been withheld.  We were asked to see the patient by her primary cardiologist, Dr. Orpah Cobb, because it was noted on the night of admission to the hospital, and several times subsequently since then, that the patient has had bloody bowel movements. This has not been associated with any significant abdominal pain or hemodynamic instability, and only a mild drop in hemoglobin. The patient denies a problem with rectal bleeding prior to admission. Her most recent episode of bleeding was during the night shift some hours ago, and was recorded as just been a small amount.  The patient is known to my partner, Dr. Dorena Cookey, who has followed her for 18 years because of a history of colon polyps. She has never had a very large polyp, but she does have a history of medium-sized or small adenomas in 1995, 1998, in 2001. She also had colonoscopy in 2004 and her most recent colonoscopy, in September 2009, was negative except for a few sigmoid diverticula.  The patient does not have any recent change in bowel habits. She has occasional mild constipation which is well controlled by the use of a stool softener.  The patient indicates that she uses a baby aspirin several times a week. She has had a poor appetite recently and some degree of weight loss. She feels like there is some trouble with food sticking in her throat. In this regard, it is notable that the patient has had previous esophageal dilatation by Dr. Madilyn Fireman, most recently in September of 2009, at which time Savary dilatation of the Schatzki's ring was performed to 17 mm.   Past Medical History    Diagnosis Date  . Diabetes mellitus   . Hypertension   . High cholesterol   . Osteoarthritis     History reviewed. No pertinent past surgical history.  Prior to Admission medications   Medication Sig Start Date End Date Taking? Authorizing Provider  amLODipine-benazepril (LOTREL) 5-20 MG per capsule Take 1 capsule by mouth daily.   Yes Historical Provider, MD  B Complex-C (B-COMPLEX WITH VITAMIN C) tablet Take 1-2 tablets by mouth daily.   Yes Historical Provider, MD  bisoprolol-hydrochlorothiazide (ZIAC) 2.5-6.25 MG per tablet Take 1 tablet by mouth daily.   Yes Historical Provider, MD  Cholecalciferol (VITAMIN D3) 5000 UNITS CAPS Take 1 capsule by mouth daily.   Yes Historical Provider, MD  Ferrous Gluconate (IRON) 240 (27 FE) MG TABS Take 1 tablet by mouth daily.   Yes Historical Provider, MD  glimepiride (AMARYL) 1 MG tablet Take 1 mg by mouth daily with breakfast.   Yes Historical Provider, MD  labetalol (NORMODYNE) 200 MG tablet Take 200 mg by mouth 2 (two) times daily.   Yes Historical Provider, MD  Magnesium 250 MG TABS Take 1 tablet by mouth daily.   Yes Historical Provider, MD  metFORMIN (GLUCOPHAGE-XR) 500 MG 24 hr tablet Take 500-1,000 mg by mouth 2 (two) times daily. 2 tablets in the morning 1 tablet in the evening.   Yes Historical Provider, MD  methocarbamol (ROBAXIN) 500 MG tablet Take 500 mg by mouth 3 (three) times daily.   Yes Historical Provider, MD  pravastatin (PRAVACHOL)  40 MG tablet Take 40 mg by mouth daily.   Yes Historical Provider, MD  sitaGLIPtin (JANUVIA) 100 MG tablet Take 100 mg by mouth daily.   Yes Historical Provider, MD  vitamin B-12 (CYANOCOBALAMIN) 500 MCG tablet Take 500 mcg by mouth daily.   Yes Historical Provider, MD    Current Facility-Administered Medications  Medication Dose Route Frequency Provider Last Rate Last Dose  . acetaminophen (TYLENOL) tablet 650 mg  650 mg Oral Q6H PRN Jeoffrey Massed, MD       Or  . acetaminophen (TYLENOL)  suppository 650 mg  650 mg Rectal Q6H PRN Jeoffrey Massed, MD      . albuterol (PROVENTIL) (5 MG/ML) 0.5% nebulizer solution 2.5 mg  2.5 mg Nebulization Q2H PRN Jeoffrey Massed, MD      . alum & mag hydroxide-simeth (MAALOX/MYLANTA) 200-200-20 MG/5ML suspension 30 mL  30 mL Oral Q6H PRN Jeoffrey Massed, MD      . amLODipine (NORVASC) tablet 5 mg  5 mg Oral Daily Manson Passey, MD   5 mg at 09/25/11 1058   And  . benazepril (LOTENSIN) tablet 20 mg  20 mg Oral Daily Manson Passey, MD   20 mg at 09/25/11 1054  . B-complex with vitamin C tablet 1-2 tablet  1-2 tablet Oral Daily Jeoffrey Massed, MD   1 tablet at 09/25/11 1058  . cefTRIAXone (ROCEPHIN) 1 g in dextrose 5 % 50 mL IVPB  1 g Intravenous Q24H Jeoffrey Massed, MD   1 g at 09/25/11 1759  . cholecalciferol (VITAMIN D) tablet 5,000 Units  5,000 Units Oral Daily Manson Passey, MD   5,000 Units at 09/25/11 1055  . cyanocobalamin tablet 500 mcg  500 mcg Oral Daily Manson Passey, MD   500 mcg at 09/25/11 1103  . dextrose 5 % and 0.45 % NaCl with KCl 40 mEq/L infusion   Intravenous Continuous Ricki Rodriguez, MD 40 mL/hr at 09/25/11 2309    . docusate sodium (COLACE) capsule 100 mg  100 mg Oral BID Jeoffrey Massed, MD   100 mg at 09/25/11 2306  . ferrous gluconate (FERGON) tablet 324 mg  324 mg Oral Q breakfast Manson Passey, MD   324 mg at 09/25/11 1057  . hydrALAZINE (APRESOLINE) injection 10 mg  10 mg Intravenous Q6H PRN Jeoffrey Massed, MD   10 mg at 09/25/11 2057  . insulin aspart (novoLOG) injection 0-9 Units  0-9 Units Subcutaneous TID WC Jeoffrey Massed, MD   2 Units at 09/26/11 [phone]  . isosorbide-hydrALAZINE (BIDIL) 20-37.5 MG per tablet 1 tablet  1 tablet Oral BID Ricki Rodriguez, MD   1 tablet at 09/25/11 2306  . linagliptin (TRADJENTA) tablet 5 mg  5 mg Oral Q breakfast Jeoffrey Massed, MD   5 mg at 09/26/11 0653  . morphine 2 MG/ML injection 0.5 mg  0.5 mg Intravenous Q4H PRN Jeoffrey Massed, MD      . ondansetron (ZOFRAN) tablet 4 mg  4 mg Oral Q6H  PRN Jeoffrey Massed, MD       Or  . ondansetron (ZOFRAN) injection 4 mg  4 mg Intravenous Q6H PRN Jeoffrey Massed, MD   4 mg at 09/24/11 2243  . potassium chloride SA (K-DUR,KLOR-CON) CR tablet 20 mEq  20 mEq Oral TID Ricki Rodriguez, MD   20 mEq at 09/25/11 2308  . senna (SENOKOT) tablet 8.6 mg  1 tablet Oral BID Jeoffrey Massed, MD   8.6 mg at 09/25/11 2305  . simvastatin (ZOCOR) tablet 20 mg  20 mg Oral q1800 Jeoffrey Massed, MD   20 mg at 09/25/11 1759  . sodium chloride 0.9 % injection 3 mL  3 mL Intravenous Q12H Jeoffrey Massed, MD   3 mL at 09/25/11 2306  . traMADol (ULTRAM) tablet 50 mg  50 mg Oral Q6H PRN Jeoffrey Massed, MD      . DISCONTD: 0.9 %  sodium chloride infusion   Intravenous Continuous Jeoffrey Massed, MD 40 mL/hr at 09/24/11 1837      Allergies as of 09/24/2011 - Review Complete 09/24/2011  Allergen Reaction Noted  . Catapres (clonidine hcl) Other (See Comments) 09/24/2011  . Covera-hs (verapamil hcl) Other (See Comments) 09/24/2011  . Other Other (See Comments) 09/24/2011  . Sulfa antibiotics  09/24/2011    History reviewed. No pertinent family history.  History   Social History  . Marital Status: Married    Spouse Name: N/A    Number of Children: N/A  . Years of Education: N/A   Occupational History  . Not on file.   Social History Main Topics  . Smoking status: Never Smoker   . Smokeless tobacco: Never Used  . Alcohol Use: No  . Drug Use: Not on file  . Sexually Active: No   Other Topics Concern  . Not on file   Social History Narrative  . No narrative on file    Review of Systems: Positive for:     Item as mentioned in history of present illness. Negative for any significant nausea or vomiting, although she did have nausea and vomiting on presentation to the hospital. No chronic heartburn symptoms.  Physical Exam: Vital signs in last 24 hours: Temp:  [98.3 F (36.8 C)-99.6 F (37.6 C)] 98.5 F (36.9 C) (02/12 0545) Pulse Rate:  [81-92] 86   (02/12 0545) Resp:  [18-20] 20  (02/12 0545) BP: (118-188)/(56-91) 118/56 mmHg (02/12 0545) SpO2:  [93 %-96 %] 94 % (02/12 0545) Weight:  [78.2 kg (172 lb 6.4 oz)] 78.2 kg (172 lb 6.4 oz) (02/12 0545) Last BM Date: 09/25/11 General:   Alert,  Well-developed, well-nourished, pleasant and cooperative in NAD Head:  Normocephalic and atraumatic. Eyes:  Sclera clear, no icterus.   Conjunctiva pink. Mouth:   No ulcerations or lesions.  Oropharynx pink & moist. Neck:   No masses or thyromegaly. Lungs:  Clear throughout to auscultation.   No wheezes, crackles, or rhonchi. No evident respiratory distress. Heart:   Regular rate and rhythm; no murmurs, clicks, rubs,  or gallops. Abdomen:  Soft, nontender, and nondistended. No masses, hepatosplenomegaly or ventral hernias noted. Without  guarding, or rebound.   Rectal:  At this time, shows liquid burgundy blood, no clots or stool present. The rectal ampulla is free of any masses. Msk:   Symmetrical without gross deformities. Pulses:  Normal, full radial pulses noted. Extremities:   Without clubbing, cyanosis, or edema. Neurologic: Alert and coherent;  grossly normal neurologically. Cervical Nodes:  No significant cervical adenopathy. Psych:   Alert and cooperative. Normal mood and affect. Skin:  Warm and dry.  Intake/Output from previous day: 02/11 0701 - 02/12 0700 In: 2383 [P.O.:1040; I.V.:1293; IV Piggyback:50] Out: -  Intake/Output this shift: Total I/O In: 240 [P.O.:240] Out: -   Lab Results:  Basename 09/26/11 0545 09/25/11 0152 09/24/11 1349  WBC 15.7* 10.8* 7.5  HGB 10.4* 11.9* 12.0  HCT 33.1* 36.9 38.5  PLT 164 170 176   BMET  Basename 09/26/11 0545 09/25/11 0152 09/24/11 1349  NA 137 137 135  K  3.0* 2.9* 3.2*  CL 99 98 96  CO2 32 32 33*  GLUCOSE 177* 168* 212*  BUN 9 8 9   CREATININE 1.09 0.60 0.66  CALCIUM 8.9 9.2 9.6   LFT  Basename 09/25/11 0152  PROT 6.1  ALBUMIN 2.8*  AST 18  ALT 10  ALKPHOS 58  BILITOT  0.4  BILIDIR --  IBILI --   PT/INR  Basename 09/25/11 0152  LABPROT 13.1  INR 0.97     Studies/Results: Dg Chest 2 View  09/24/2011  *RADIOLOGY REPORT*  Clinical Data: Cough  CHEST - 2 VIEW  Comparison: 04/13/2009  Findings: Low lung volumes with bibasilar atelectasis. No pleural effusion or pneumothorax.  Mild cardiomegaly.  Degenerative changes of the visualized thoracolumbar spine.  Prior lumbar vertebral augmentation.  Bilateral shoulder arthroplasties.  IMPRESSION: Low lung volumes with bibasilar atelectasis.  Mild cardiomegaly.  Original Report Authenticated By: Charline Bills, M.D.    Impression: I think this is probably a low-grade diverticular hemorrhage. In fact, it may be what led to the patient's vagal symptoms on presentation to the hospital. The differential diagnosis would include vascular ectasia, ischemic colitis, or stercoral ulceration or possibly even an internal hemorrhoid. The patient is known to have diverticula, and is up-to-date on colonoscopy so a significant neoplastic lesion with ulceration and bleeding is very unlikely. Ischemic colitis seems fairly unlikely given the absence of significant pain, although the port patient does report a little bit of low abdominal pain when specifically asked and prompted. A stercoral ulceration seems unlikely given the absence of any history of hard stools or fecal impaction.  From the swallowing perspective, the patient seems to think that her current symptoms are similar to those which prompted her previous esophageal dilatation, and thus, she has probably had recurrence of her Schatzki's ring or it  Plan:  1. I would favor observation, because most likely etiologies for her bleeding are self-limited, and not endoscopically remediable. However, if the bleeding persists, further diagnostic evaluation should be considered, which might be a bleeding scan, a CT scan looking for ischemic colitis, or sigmoidoscopic evaluation to  exclude mucosal abnormalities in to try to generally gauge the origin of the bleeding. I might add that the patient has had a completely normal BUN and has had hemodynamic stability since admission, so despite her history of aspirin exposure, I do not feel that endoscopic evaluation looking for an upper tract source as needed.  2. The patient should have elective outpatient evaluation and treatment of her dysphagia symptoms. This might include a barium swallow with a tablet, or simply proceeding directly to endoscopic dilatation. However, I think these should be deferred until her bleeding problem has been completely addressed.    LOS: 2 days   Heidi Dominguez V  09/26/2011, 10:30 AM

## 2011-09-26 NOTE — Progress Notes (Signed)
GI CONSULT PRELIMINARY NOTE:  (full note to follow)im  Pt seen at request of [name] for hematochezia.  My preliminary impression is mild diverticular bleeding.  Recomm:  Observation for another 1 - 2 days, consider flex sig or bleeding scan if bleeding persists.  Would check CBC q 12hr.  Florencia Reasons, M.D. [phone]

## 2011-09-27 LAB — BASIC METABOLIC PANEL
Calcium: 9.2 mg/dL (ref 8.4–10.5)
GFR calc Af Amer: 86 mL/min — ABNORMAL LOW (ref >90)
GFR calc non Af Amer: 74 mL/min — ABNORMAL LOW (ref >90)
Glucose, Bld: 152 mg/dL — ABNORMAL HIGH (ref 70–99)
Potassium: 4.6 mEq/L (ref 3.5–5.1)
Sodium: 136 mEq/L (ref 135–145)

## 2011-09-27 LAB — CBC
Hemoglobin: 10.4 g/dL — ABNORMAL LOW (ref 12.0–15.0)
MCH: 28.7 pg (ref 26.0–34.0)
MCHC: 32.2 g/dL (ref 30.0–36.0)
Platelets: 171 10*3/uL (ref 150–400)
RDW: 14.7 % (ref 11.5–15.5)

## 2011-09-27 LAB — GLUCOSE, CAPILLARY: Glucose-Capillary: 178 mg/dL — ABNORMAL HIGH (ref 70–99)

## 2011-09-27 MED ORDER — SACCHAROMYCES BOULARDII 250 MG PO CAPS
250.0000 mg | ORAL_CAPSULE | Freq: Two times a day (BID) | ORAL | Status: DC
  Administered 2011-09-27 – 2011-09-30 (×7): 250 mg via ORAL
  Filled 2011-09-27 (×8): qty 1

## 2011-09-27 NOTE — Plan of Care (Signed)
Problem: Phase I Progression Outcomes Goal: Up in chair, BRP Outcome: Progressing UP  IN CHAIR WITH pt TODAY

## 2011-09-27 NOTE — Progress Notes (Signed)
Nonbloody bowel movement last night, per RN. No bowel movements this morning.   Hemoglobin this morning is identical to yesterday morning, 10.4.  Abdomen is nontender.  Impression: Quiescent lower GI bleed, probably diverticular origin  Plan: Advance diet  Florencia Reasons, M.D. [phone]

## 2011-09-27 NOTE — Progress Notes (Signed)
Subjective: Fells better.  Objective: Vital signs in last 24 hours: Temp:  [98.7 F (37.1 C)-99.2 F (37.3 C)] 99 F (37.2 C) (02/13 0612) Pulse Rate:  [84-92] 92  (02/13 0612) Resp:  [20] 20  (02/13 0612) BP: (124-144)/(58-69) 124/68 mmHg (02/13 0612) SpO2:  [91 %-95 %] 95 % (02/13 0612) Weight:  [77.928 kg (171 lb 12.8 oz)] 77.928 kg (171 lb 12.8 oz) (02/13 0612) Weight change: -0.272 kg (-9.6 oz) Last BM Date: 09/26/11  Intake/Output from previous day: 02/12 0701 - 02/13 0700 In: 2510 [P.O.:1200; I.V.:960; IV Piggyback:350] Out: -  Total I/O In: 360 [P.O.:360] Out: -    Physical Exam: General: Comfortable, alert, communicative, fully oriented, not short of breath at rest.  HEENT:  Mild clinical pallor, no jaundice, no conjunctival injection or discharge. Hydration status is satisfactory. NECK:  Supple, JVP not seen, no carotid bruits, no palpable lymphadenopathy, no palpable goiter. CHEST:  Clinically clear to auscultation, no wheezes, no crackles. HEART:  Sounds 1 and 2 heard, normal, regular, no murmurs. ABDOMEN:  Full, soft, non-tender, no palpable organomegaly, no palpable masses, normal bowel sounds. GENITALIA:  Not examined. LOWER EXTREMITIES:  No pitting edema, palpable peripheral pulses. MUSCULOSKELETAL SYSTEM:  Generalized osteoarthritic changes, otherwise, normal. CENTRAL NERVOUS SYSTEM:  No focal neurologic deficit on gross examination.  Lab Results:  Basename 09/27/11 0520 09/26/11 1625  WBC 14.3* 14.6*  HGB 10.4* 10.2*  HCT 32.3* 31.8*  PLT 171 168    Basename 09/27/11 0520 09/26/11 0545  NA 136 137  K 4.6 3.0*  CL 101 99  CO2 29 32  GLUCOSE 152* 177*  BUN 9 9  CREATININE 0.73 1.09  CALCIUM 9.2 8.9   Recent Results (from the past 240 hour(s))  URINE CULTURE     Status: Normal   Collection Time   09/24/11  1:48 PM      Component Value Range Status Comment   Specimen Description URINE, CATHETERIZED   Final    Special Requests NONE   Final      Culture  Setup Time [id number]   Final    Colony Count >=100,000 COLONIES/ML   Final    Culture CITROBACTER FREUNDII   Final    Report Status 09/26/2011 FINAL   Final    Organism ID, Bacteria CITROBACTER FREUNDII   Final   STOOL CULTURE     Status: Normal (Preliminary result)   Collection Time   09/24/11  3:00 PM      Component Value Range Status Comment   Specimen Description STOOL   Final    Special Requests Normal   Final    Culture NO SUSPICIOUS COLONIES, CONTINUING TO HOLD   Final    Report Status PENDING   Incomplete   URINE CULTURE     Status: Normal   Collection Time   09/24/11  8:50 PM      Component Value Range Status Comment   Specimen Description URINE, CATHETERIZED   Final    Special Requests NONE   Final    Culture  Setup Time [id number]   Final    Colony Count 2,000 COLONIES/ML   Final    Culture INSIGNIFICANT GROWTH   Final    Report Status 09/26/2011 FINAL   Final      Studies/Results: No results found.  Medications: Scheduled Meds:   . amLODipine  5 mg Oral Daily   And  . benazepril  20 mg Oral Daily  . B-complex with vitamin C  1-2 tablet Oral  Daily  . cefTRIAXone (ROCEPHIN)  IV  1 g Intravenous Q24H  . cholecalciferol  5,000 Units Oral Daily  . vitamin B-12  500 mcg Oral Daily  . docusate sodium  100 mg Oral BID  . ferrous gluconate  324 mg Oral Q breakfast  . insulin aspart  0-9 Units Subcutaneous TID WC  . isosorbide-hydrALAZINE  1 tablet Oral BID  . linagliptin  5 mg Oral Q breakfast  . potassium chloride  10 mEq Intravenous Q1 Hr x 3  . saccharomyces boulardii  250 mg Oral BID  . senna  1 tablet Oral BID  . simvastatin  20 mg Oral q1800  . sodium chloride  3 mL Intravenous Q12H  . DISCONTD: potassium chloride  20 mEq Oral TID   Continuous Infusions:   . dextrose 5 % and 0.45 % NaCl with KCl 40 mEq/L 40 mL/hr at 09/27/11 0829   PRN Meds:.acetaminophen, acetaminophen, albuterol, alum & mag hydroxide-simeth, hydrALAZINE, morphine,  ondansetron (ZOFRAN) IV, ondansetron, traMADol  Assessment/Plan:  Principal Problem:  *Weakness: Multi-factorial, secondary to UTI, bradycardia and electrolyte abnormalities. Now much improved, with resolution/improvement of acute problems.  Active Problems:  1. Bradycardia: Secondary to beta blockers, which have been held. HR is ranging between 84-92. Cardiology consultation was provided by Dr Algie Coffer.  2. UTI (lower urinary tract infection): Now on day # 4/7 Rocephin Urine cultures grew pan-sensitive Citrobacter Freundii. Patient has remained apyrexial, and wcc is trending down.  3. Hematochezia: This occurred on day of admission. Dr Molly Maduro Buccini is on GI consultation, and has opined that patient has quiescent lower GI bleed, probably diverticular origin. There have been no recurrences, hemoglobin has remained stable and therefore, lower GI endoscopy was not done. GI recommends observation for now.  4. DM (diabetes mellitus): Reasonably controlled on diet, SSI and Tradjenta.  5. HTN (hypertension): Controlled on Amlodipine and Benazepril.  6. Dyslipidemia: On statin.  7. Hypokalemia: Repleting as appropriate.  Comment: Pt/OT Eval. Suspect will be able to discharge soon.  LOS: 3 days   Heidi Dominguez,[name] 09/27/2011, 10:57 AM

## 2011-09-27 NOTE — Progress Notes (Signed)
Please see my progress note from earlier today.  I will decrease the frequency of the patient's blood draws, in view of her apparent cessation of bleeding.  Also, because of her antibiotic therapy, I will give FloraStor as probiotic coverage to possibly decrease the risk of her contracting C. difficile infection

## 2011-09-27 NOTE — Progress Notes (Signed)
Subjective:  Feeling better. No additional lower gastrointestinal bleed.  Objective:  Vital Signs in the last 24 hours: Temp:  [98.7 F (37.1 C)-99.2 F (37.3 C)] 99 F (37.2 C) (02/13 0612) Pulse Rate:  [84-92] 92  (02/13 0612) Cardiac Rhythm:  [-] Normal sinus rhythm (02/12 2000) Resp:  [20] 20  (02/13 0612) BP: (124-144)/(58-69) 124/68 mmHg (02/13 0612) SpO2:  [91 %-95 %] 95 % (02/13 0612) Weight:  [77.928 kg (171 lb 12.8 oz)] 77.928 kg (171 lb 12.8 oz) (02/13 0612)  Physical Exam: BP Readings from Last 1 Encounters:  09/27/11 124/68    Wt Readings from Last 1 Encounters:  09/27/11 77.928 kg (171 lb 12.8 oz)    Weight change: -0.272 kg (-9.6 oz)  HEENT: [place]/AT, Eyes-Brown, PERL, EOMI, Conjunctiva-Pale pink, Sclera-Non-icteric Neck: No JVD, No bruit, Trachea midline. Lungs:  Clear, Bilateral. Cardiac:  Regular rhythm, normal S1 and S2, no S3.  Abdomen:  Soft, non-tender. Extremities:  No edema present. No cyanosis. No clubbing. CNS: AxOx3, Cranial nerves grossly intact, moves all 4 extremities. Right handed. Skin: Warm and dry.   Intake/Output from previous day: 02/12 0701 - 02/13 0700 In: 2510 [P.O.:1200; I.V.:960; IV Piggyback:350] Out: -     Lab Results: BMET    Component Value Date/Time   NA 136 09/27/2011 0520   K 4.6 09/27/2011 0520   CL 101 09/27/2011 0520   CO2 29 09/27/2011 0520   GLUCOSE 152* 09/27/2011 0520   BUN 9 09/27/2011 0520   CREATININE 0.73 09/27/2011 0520   CALCIUM 9.2 09/27/2011 0520   GFRNONAA 74* 09/27/2011 0520   GFRAA 86* 09/27/2011 0520   CBC    Component Value Date/Time   WBC 14.3* 09/27/2011 0520   RBC 3.62* 09/27/2011 0520   HGB 10.4* 09/27/2011 0520   HCT 32.3* 09/27/2011 0520   PLT 171 09/27/2011 0520   MCV 89.2 09/27/2011 0520   MCH 28.7 09/27/2011 0520   MCHC 32.2 09/27/2011 0520   RDW 14.7 09/27/2011 0520   LYMPHSABS 1.2 06/04/2011 1400   MONOABS 0.9 06/04/2011 1400   EOSABS 0.1 06/04/2011 1400   BASOSABS 0.0 06/04/2011 1400    CARDIAC ENZYMES Lab Results  Component Value Date   CKTOTAL 41 09/25/2011   CKMB 1.2 09/25/2011   TROPONINI <0.30 09/25/2011    Assessment/Plan:  Patient Active Hospital Problem List:  Bradycardia (09/24/2011) Assessment: Improved off B-blocker UTI (lower urinary tract infection) (09/24/2011) Assessment: Stable Plan: Continue antibiotic Hematochezia (09/24/2011) Assessment: ? Stable Plan: Monitor H/H per GI/Dr. Buccini DM, II (diabetes mellitus) (09/24/2011) Assessment: Stable Plan: Continue medications HTN (hypertension) (09/24/2011) Assessment: Controlled Calcific AV with Very mild AS, Mild MR and TR. Dyslipidemia (09/24/2011) Assessment: Stable Dysphagia  Hypokalemia -Improved Anemia of blood loss/Possible Diverticular bleed   LOS: 3 days    Orpah Cobb  MD  09/27/2011, 10:24 AM

## 2011-09-27 NOTE — Progress Notes (Signed)
Physical Therapy Treatment Patient Details Name: Heidi Dominguez MRN: [id number] DOB: 1921/06/25 Today's Date: 09/27/2011  PT Assessment/Plan  PT - Assessment/Plan Comments on Treatment Session: pt much improved.  Bloody stools appear to have ceased.  She is just weak with decr activity tolerance at this point PT Plan: Discharge plan remains appropriate Follow Up Recommendations: Home health PT PT Goals  Acute Rehab PT Goals PT Goal: Supine/Side to Sit - Progress: Progressing toward goal PT Goal: Sit to Stand - Progress: Progressing toward goal PT Goal: Stand to Sit - Progress: Progressing toward goal PT Transfer Goal: Bed to Chair/Chair to Bed - Progress: Progressing toward goal PT Goal: Ambulate - Progress: Progressing toward goal  PT Treatment Precautions/Restrictions  Precautions Precautions: Fall Restrictions Weight Bearing Restrictions: No Other Position/Activity Restrictions:    Mobility (including Balance) Bed Mobility Bed Mobility: Yes Rolling Right: 4: Min assist Supine to Sit: 4: Min assist;HOB flat Supine to Sit Details (indicate cue type and reason): assist to help initiate the first part of the movement, vc's for hand placment Sitting - Scoot to Edge of Bed: Other (comment) (min guard assist) Transfers Transfers: Yes Sit to Stand: 4: Min assist;From bed;With upper extremity assist;Other (comment) (times three) Sit to Stand Details (indicate cue type and reason): reinforcement of hand placement/technique; mild assist to come forward Stand to Sit: 4: Min assist Ambulation/Gait Ambulation/Gait: Yes Ambulation/Gait Assistance: 4: Min assist Ambulation/Gait Assistance Details (indicate cue type and reason): frequent cues for flexed posture, vc's for better positioning in the RW and  assist to help maneirver the RW Ambulation Distance (Feet): 22 Feet (then 140 feet with marching in place warm up) Assistive device: Rolling walker Gait Pattern: Decreased step length -  right;Decreased step length - left;Decreased stride length;Step-through pattern;Trunk flexed Stairs: No    Exercise    End of Session PT - End of Session Activity Tolerance: Patient tolerated treatment well Patient left: in chair;with call bell in reach Nurse Communication: Mobility status for ambulation;Mobility status for transfers General Behavior During Session: [facility] for tasks performed Cognition: [facility] for tasks performed  Vastie Douty, Eliseo Gum 09/27/2011, 11:56 AM  09/27/2011  Bangor Bing, PT [phone] (pager)

## 2011-09-27 NOTE — Progress Notes (Signed)
Utilization Review Completed.Jaiveon Suppes T2/13/2013   

## 2011-09-28 LAB — BASIC METABOLIC PANEL
GFR calc Af Amer: 90 mL/min — ABNORMAL LOW (ref >90)
GFR calc non Af Amer: 77 mL/min — ABNORMAL LOW (ref >90)
Glucose, Bld: 154 mg/dL — ABNORMAL HIGH (ref 70–99)
Potassium: 4.3 mEq/L (ref 3.5–5.1)
Sodium: 136 mEq/L (ref 135–145)

## 2011-09-28 LAB — GLUCOSE, CAPILLARY: Glucose-Capillary: 140 mg/dL — ABNORMAL HIGH (ref 70–99)

## 2011-09-28 LAB — STOOL CULTURE: Special Requests: NORMAL

## 2011-09-28 LAB — CBC
Hemoglobin: 10.2 g/dL — ABNORMAL LOW (ref 12.0–15.0)
RBC: 3.62 MIL/uL — ABNORMAL LOW (ref 3.87–5.11)

## 2011-09-28 MED ORDER — MAGNESIUM HYDROXIDE 400 MG/5ML PO SUSP
30.0000 mL | Freq: Once | ORAL | Status: AC
  Administered 2011-09-28: 30 mL via ORAL
  Filled 2011-09-28: qty 30

## 2011-09-28 MED ORDER — SODIUM CHLORIDE 0.9 % IJ SOLN
10.0000 mL | Freq: Two times a day (BID) | INTRAMUSCULAR | Status: DC
  Administered 2011-09-28: 10 mL via INTRAVENOUS

## 2011-09-28 NOTE — Progress Notes (Signed)
GASTROENTEROLOGY PROGRESS NOTE  Problem:   Hematochezia  Subjective: No bowel movement for the past 36 hours.  Objective: Hemoglobin stable over the past 48 hours, currently 10.2  Assessment: Quiescent lower GI bleed, most likely of diverticular origin  Plan:  1. I will sign off at this time. Please call me if you have any questions regarding the GI aspects of this patient's care.  2. I have made the patient an appointment with her primary gastroenterologist, Dr. Dorena Cookey, for February 26 at 2:30 PM.    3. I think discharge in the next one to 2 days would be appropriate unless the patient shows signs of further bleeding in the meantime. At the time of discharge, the patient and her family should be instructed that there is a small to moderate chance of recurrent bleeding within the next week, so if they observe recurrent bleeding, they should return to the emergency room.  4. I do not think the patient needs to go home on FloraStor probiotic unless she is also going home on antibiotics, in which case the FloraStor would probably be appropriate to continue for as long as she remains on antibiotics. That product is available over-the-counter.  Florencia Reasons, M.D. [phone]      Florencia Reasons, M.D. 09/28/2011 9:15 AM

## 2011-09-28 NOTE — Progress Notes (Signed)
Subjective: Continued improvement. Constipated.  Objective: Vital signs in last 24 hours: Temp:  [98.8 F (37.1 C)-99.3 F (37.4 C)] 99.2 F (37.3 C) (02/14 1354) Pulse Rate:  [97-105] 99  (02/14 1354) Resp:  [18-20] 18  (02/14 1354) BP: (107-146)/(57-68) 138/64 mmHg (02/14 1354) SpO2:  [93 %-96 %] 96 % (02/14 1354) Weight:  [75.388 kg (166 lb 3.2 oz)] 75.388 kg (166 lb 3.2 oz) (02/14 0444) Weight change: -2.54 kg (-5 lb 9.6 oz) Last BM Date: 09/26/11  Intake/Output from previous day: 02/13 0701 - 02/14 0700 In: 1520.7 [P.O.:1200; I.V.:320.7] Out: -  Total I/O In: 1855.3 [P.O.:940; I.V.:915.3] Out: 100 [Urine:100]   Physical Exam: General: Comfortable, alert, communicative, fully oriented, not short of breath at rest.  HEENT:  Mild clinical pallor, no jaundice, no conjunctival injection or discharge. Hydration status is satisfactory. NECK:  Supple, JVP not seen, no carotid bruits, no palpable lymphadenopathy, no palpable goiter. CHEST:  Clinically clear to auscultation, no wheezes, no crackles. HEART:  Sounds 1 and 2 heard, normal, regular, no murmurs. ABDOMEN:  Full, soft, non-tender, no palpable organomegaly, no palpable masses, normal bowel sounds. GENITALIA:  Not examined. LOWER EXTREMITIES:  No pitting edema, palpable peripheral pulses. MUSCULOSKELETAL SYSTEM:  Generalized osteoarthritic changes, otherwise, normal. CENTRAL NERVOUS SYSTEM:  No focal neurologic deficit on gross examination.  Lab Results:  [facility] 09/28/11 0650 09/27/11 0520  WBC 10.9* 14.3*  HGB 10.2* 10.4*  HCT 31.8* 32.3*  PLT 191 171    Basename 09/28/11 0650 09/27/11 0520  NA 136 136  K 4.3 4.6  CL 101 101  CO2 29 29  GLUCOSE 154* 152*  BUN 8 9  CREATININE 0.64 0.73  CALCIUM 9.5 9.2   Recent Results (from the past 240 hour(s))  URINE CULTURE     Status: Normal   Collection Time   09/24/11  1:48 PM      Component Value Range Status Comment   Specimen Description URINE, CATHETERIZED    Final    Special Requests NONE   Final    Culture  Setup Time [id number]   Final    Colony Count >=100,000 COLONIES/ML   Final    Culture CITROBACTER FREUNDII   Final    Report Status 09/26/2011 FINAL   Final    Organism ID, Bacteria CITROBACTER FREUNDII   Final   STOOL CULTURE     Status: Normal   Collection Time   09/24/11  3:00 PM      Component Value Range Status Comment   Specimen Description STOOL   Final    Special Requests Normal   Final    Culture     Final    Value: NO SALMONELLA, SHIGELLA, CAMPYLOBACTER, OR YERSINIA ISOLATED   Report Status 09/28/2011 FINAL   Final   URINE CULTURE     Status: Normal   Collection Time   09/24/11  8:50 PM      Component Value Range Status Comment   Specimen Description URINE, CATHETERIZED   Final    Special Requests NONE   Final    Culture  Setup Time [id number]   Final    Colony Count 2,000 COLONIES/ML   Final    Culture INSIGNIFICANT GROWTH   Final    Report Status 09/26/2011 FINAL   Final      Studies/Results: No results found.  Medications: Scheduled Meds:    . amLODipine  5 mg Oral Daily   And  . benazepril  20 mg Oral Daily  . B-complex  with vitamin C  1-2 tablet Oral Daily  . cefTRIAXone (ROCEPHIN)  IV  1 g Intravenous Q24H  . cholecalciferol  5,000 Units Oral Daily  . vitamin B-12  500 mcg Oral Daily  . docusate sodium  100 mg Oral BID  . ferrous gluconate  324 mg Oral Q breakfast  . insulin aspart  0-9 Units Subcutaneous TID WC  . isosorbide-hydrALAZINE  1 tablet Oral BID  . linagliptin  5 mg Oral Q breakfast  . saccharomyces boulardii  250 mg Oral BID  . senna  1 tablet Oral BID  . simvastatin  20 mg Oral q1800  . sodium chloride  3 mL Intravenous Q12H   Continuous Infusions:    . dextrose 5 % and 0.45 % NaCl with KCl 40 mEq/L 40 mL/hr at 09/28/11 0211   PRN Meds:.acetaminophen, acetaminophen, albuterol, alum & mag hydroxide-simeth, hydrALAZINE, morphine, ondansetron (ZOFRAN) IV, ondansetron,  traMADol  Assessment/Plan:  Principal Problem:  *Weakness: Multi-factorial, secondary to UTI, bradycardia and electrolyte abnormalities. Now much improved, with resolution/improvement of acute problems.  Active Problems:  1. Bradycardia: Secondary to beta blockers, which have been held. HR is ranging between 84-92. Cardiology consultation was provided by Dr Algie Coffer. Patient has declined further cardiology work up, and may follow up with Dr Algie Coffer, as needed, on an outpatient basis, should any further problems arise.  2. UTI (lower urinary tract infection): Now on day # 5/7 Rocephin. Urine cultures grew pan-sensitive Citrobacter Freundii. Patient has remained apyrexial, and wcc is trending down steadily.  3. Hematochezia: This occurred on day of admission. Dr Molly Maduro Buccini provided GI consultation, and has opined that patient has quiescent lower GI bleed, probably diverticular origin. There have been no recurrences, hemoglobin has remained stable and therefore, lower GI endoscopy was not done. GI recommends observation for now. GI follow up with Dr Madilyn Fireman, has been arranged on discharge. GI has signed off today.  4. DM (diabetes mellitus): Reasonably controlled on diet, SSI and Tradjenta.  5. HTN (hypertension): Controlled on Amlodipine and Benazepril.  6. Dyslipidemia: On statin.  7. Hypokalemia: Repleting as appropriate.  8. Constipation. Patient is already on laxatives. Will try MOM, per patient's request.  Comment: Seen by PT/OT. Recommended HHPT. Patient is clinically stable for discharge on 09/29/11, but due to logistics reasons, will likely discharge in AM of 09/30/11.  LOS: 4 days   Fidel Caggiano,[name] 09/28/2011, 5:04 PM

## 2011-09-28 NOTE — Progress Notes (Signed)
Subjective:  Happy with no rectal bleed but now has weakness. Wants cardiac workup postponed for now  Objective:  Vital Signs in the last 24 hours: Temp:  [98.6 F (37 C)-99.3 F (37.4 C)] 99.3 F (37.4 C) (02/14 0444) Pulse Rate:  [97-106] 97  (02/14 0444) Cardiac Rhythm:  [-] Normal sinus rhythm (02/13 1940) Resp:  [18-20] 18  (02/14 0444) BP: (107-146)/(52-68) 146/68 mmHg (02/14 1122) SpO2:  [93 %-96 %] 95 % (02/14 0444) Weight:  [75.388 kg (166 lb 3.2 oz)] 75.388 kg (166 lb 3.2 oz) (02/14 0444)  Physical Exam: BP Readings from Last 1 Encounters:  09/28/11 146/68    Wt Readings from Last 1 Encounters:  09/28/11 75.388 kg (166 lb 3.2 oz)    Weight change: -2.54 kg (-5 lb 9.6 oz)  HEENT: [place]/AT, Eyes-Brown, PERL, EOMI, Conjunctiva-Pale pink, Sclera-Non-icteric Neck: No JVD, No bruit, Trachea midline. Lungs:  Clear, Bilateral. Cardiac:  Regular rhythm, normal S1 and S2, no S3.  Abdomen:  Soft, non-tender. Extremities:  No edema present. No cyanosis. No clubbing. CNS: AxOx3, Cranial nerves grossly intact, moves all 4 extremities. Right handed. Skin: Warm and dry.   Intake/Output from previous day: 02/13 0701 - 02/14 0700 In: 1520.7 [P.O.:1200; I.V.:320.7] Out: -     Lab Results: BMET    Component Value Date/Time   NA 136 09/28/2011 0650   K 4.3 09/28/2011 0650   CL 101 09/28/2011 0650   CO2 29 09/28/2011 0650   GLUCOSE 154* 09/28/2011 0650   BUN 8 09/28/2011 0650   CREATININE 0.64 09/28/2011 0650   CALCIUM 9.5 09/28/2011 0650   GFRNONAA 77* 09/28/2011 0650   GFRAA 90* 09/28/2011 0650   CBC    Component Value Date/Time   WBC 10.9* 09/28/2011 0650   RBC 3.62* 09/28/2011 0650   HGB 10.2* 09/28/2011 0650   HCT 31.8* 09/28/2011 0650   PLT 191 09/28/2011 0650   MCV 87.8 09/28/2011 0650   MCH 28.2 09/28/2011 0650   MCHC 32.1 09/28/2011 0650   RDW 14.6 09/28/2011 0650   LYMPHSABS 1.2 06/04/2011 1400   MONOABS 0.9 06/04/2011 1400   EOSABS 0.1 06/04/2011 1400   BASOSABS 0.0  06/04/2011 1400   CARDIAC ENZYMES Lab Results  Component Value Date   CKTOTAL 41 09/25/2011   CKMB 1.2 09/25/2011   TROPONINI <0.30 09/25/2011    Assessment/Plan:  Patient Active Hospital Problem List:  Bradycardia (09/24/2011) Assessment: Improved off B-blocker UTI (lower urinary tract infection) (09/24/2011) Assessment: Stable Plan: Continue antibiotic Hematochezia (09/24/2011) Assessment: ? Stable Plan: Monitor H/H per GI/Dr. Buccini DM, II (diabetes mellitus) (09/24/2011) Assessment: Stable Plan: Continue medications HTN (hypertension) (09/24/2011) Assessment: Controlled Calcific AV with Very mild AS, Mild MR and TR. Dyslipidemia (09/24/2011) Assessment: Stable Dysphagia  Hypokalemia -Improved  Anemia of blood loss/Possible Diverticular bleed Increase ambulation. Home soon if ambulates well   LOS: 4 days    Orpah Cobb  MD  09/28/2011, 12:58 PM

## 2011-09-28 NOTE — Discharge Summary (Signed)
Physician Discharge Summary  Patient ID: Heidi Dominguez MRN: [id number] DOB/AGE: Dec 16, 1920 78 y.o.  Admit date: 09/24/2011 Discharge date: 09/30/2011  Primary Care Physician:  Reather Littler, MD, MD   Discharge Diagnoses:    Patient Active Problem List  Diagnoses  . Weakness  . Bradycardia  . UTI (lower urinary tract infection)  . Hematochezia  . DM (diabetes mellitus)  . HTN (hypertension)  . Dyslipidemia    Medication List  As of 09/30/2011 12:08 PM   STOP taking these medications         bisoprolol-hydrochlorothiazide 2.5-6.25 MG per tablet      labetalol 200 MG tablet      metFORMIN 500 MG 24 hr tablet         TAKE these medications         amLODipine-benazepril 5-20 MG per capsule   Commonly known as: LOTREL   Take 1 capsule by mouth daily.      B-complex with vitamin C tablet   Take 1-2 tablets by mouth daily.      glimepiride 1 MG tablet   Commonly known as: AMARYL   Take 1 mg by mouth daily with breakfast.      Iron 240 (27 FE) MG Tabs   Take 1 tablet by mouth daily.      isosorbide-hydrALAZINE 20-37.5 MG per tablet   Commonly known as: BIDIL   Take 1 tablet by mouth 2 (two) times daily.      Magnesium 250 MG Tabs   Take 1 tablet by mouth daily.      methocarbamol 500 MG tablet   Commonly known as: ROBAXIN   Take 500 mg by mouth 3 (three) times daily.      metoprolol succinate 25 MG 24 hr tablet   Commonly known as: TOPROL-XL   Take 1 tablet (25 mg total) by mouth daily.      pravastatin 40 MG tablet   Commonly known as: PRAVACHOL   Take 40 mg by mouth daily.      sitaGLIPtin 100 MG tablet   Commonly known as: JANUVIA   Take 100 mg by mouth daily.      vitamin B-12 500 MCG tablet   Commonly known as: CYANOCOBALAMIN   Take 500 mcg by mouth daily.      Vitamin D3 5000 UNITS Caps   Take 1 capsule by mouth daily.             Disposition and Follow-up: Follow up with primary MD, and with Dr Dorena Cookey,  gastroenterologist.  Consults:  cardiology and GI  Dr. Rinaldo Cloud, cardiologist. Dr. Bernette Redbird, gastroenterologist.  Significant Diagnostic Studies:  Dg Chest 2 View  09/24/2011  *RADIOLOGY REPORT*  Clinical Data: Cough  CHEST - 2 VIEW  Comparison: 04/13/2009  Findings: Low lung volumes with bibasilar atelectasis. No pleural effusion or pneumothorax.  Mild cardiomegaly.  Degenerative changes of the visualized thoracolumbar spine.  Prior lumbar vertebral augmentation.  Bilateral shoulder arthroplasties.  IMPRESSION: Low lung volumes with bibasilar atelectasis.  Mild cardiomegaly.  Original Report Authenticated By: Charline Bills, M.D.    Brief H and P: For complete details, refer to admission H and P. However, in brief, this is a 78 year old female, with history of diabetes, hypertension, chronic back pain, osteoarthritis, ambulant with a rolling walker, presenting with progressive weakness, unassociated with chest pain or shortness of breath. While in the ED, she was noted to have one loose stool with some amount of blood. During evaluation, heart  rate dropped transiently to the low 40s and high 30s and a12-lead EKG revealed sinus bradycardia with nonspecific ST changes. She was admitted for further evaluation, investigation and management.  Physical Exam: On 09/30/11. General: Comfortable, alert, communicative, fully oriented, not short of breath at rest.  HEENT: Mild clinical pallor, no jaundice, no conjunctival injection or discharge. Hydration status is satisfactory.  NECK: Supple, JVP not seen, no carotid bruits, no palpable lymphadenopathy, no palpable goiter.  CHEST: Clinically clear to auscultation, no wheezes, no crackles.  HEART: Sounds 1 and 2 heard, normal, regular, no murmurs.  ABDOMEN: Full, soft, non-tender, no palpable organomegaly, no palpable masses, normal bowel sounds.  GENITALIA: Not examined.  LOWER EXTREMITIES: No pitting edema, palpable peripheral pulses.   MUSCULOSKELETAL SYSTEM: Generalized osteoarthritic changes, otherwise, normal.  CENTRAL NERVOUS SYSTEM: No focal neurologic deficit on gross examination.  Hospital Course:  Principal Problem:  *Weakness: This was the present initial complaint, and turned out to be multi-factorial, secondary to UTI, bradycardia and electrolyte abnormalities. As of 09/28/11, patient was much improved, with resolution/improvement of acute problems. For details, see below. Active Problems:  1. Bradycardia: This was noted at presentation, and was initially thought to be due to pain. 12-lead EKG demonstrated sinus bradycardia, which remained persistent, till beta-blockers were discontinued, and this appears to have been the culprit. HR is now ranging between 84-92. TSH was normal, at 1.085 and cardiac enzymes were un-elevated. 2D echocardiogram of 09/25/11, showed normal left ventricular cavity size, moderate concentric hypertrophy, ejection fraction of 55% to 60% and mild hypokinesis of the midinferoseptal myocardium, as well as aortic mild stenosis and mild mitral regurgitation. Pulmonary systolic pressure was mildly increased. PA peak pressure: 42mm Hg (S). Cardiology consultation was provided by Dr Algie Coffer. Patient has declined further cardiology work up, and may follow up with Dr Algie Coffer, as needed, on an outpatient basis, should any further problems arise. She was restarted by Dr Algie Coffer, on low dose beta-blocker, on 09/29/11. 2. UTI (lower urinary tract infection): This was based on the finding of a positive urinary sediment, and patient was commenced on intravenous Rocephin. Urine cultures grew pan-sensitive Citrobacter Freundii. Patient has remained apyrexial, and wcc is trended down steadily. Antibiotic therapy was concluded on 09/30/11, after 7 days treatment. 3. Hematochezia: As described above, this occurred on day of admission. Dr Molly Maduro Buccini provided GI consultation, and has opined that patient has quiescent lower  GI bleed, probably diverticular origin. There have been no recurrences, hemoglobin has remained stable and therefore, lower GI endoscopy was not done. GI recommends observation for now, and follow up with Dr Madilyn Fireman, has been arranged on discharge. GI signed off on 09/28/11.  4. DM (diabetes mellitus): This was reasonably controlled on diet, SSI and Tradjenta. Amaryl will be recommenced on discharge, but Metformin has been discontinued, secondary to age. 5. HTN (hypertension): BP remained controlled on Amlodipine and Benazepril. As described above, pre-admission beta-blocker was discontinued, due to significant bradycardia. 6. Dyslipidemia: On statin.   Comment: Patient was evaluated by PT/OT. Recommended HHPT/RN.   Patient is clinically stable for discharge on 09/30/11.   Time spent on Discharge: 45 mins.  Signed: Kamyla Olejnik,[name] 09/30/2011, 12:08 PM

## 2011-09-28 NOTE — Progress Notes (Signed)
Clinical Child psychotherapist (CSW) completed psychosocial assessment which has been placed in pt shadow chart. CSW informed that pt daughter would like to speak to CSW. CSW received consent from pt to speak to daughter [name]. CSW spoke to [place] over the phone [phone]. CSW was informed that pt would dc home with her grand daughter however pt may need placement in the future and therefore [name] was inquiring about ALF/SNF and HH services. CSW explained the difference of placement options and informed that ALF is only covered by Medicaid or private pay. CSW was asked to email ALF list and to speak to [facility] regarding services pt would qualify for when she dc home. CSW informed RNCM with daughter contact info. Pt to dc home with HH, at this moment no addirtonal needs addressed. CSW is signing off.  Theresia Bough, MSW, Theresia Majors [phone]

## 2011-09-29 LAB — GLUCOSE, CAPILLARY
Glucose-Capillary: 158 mg/dL — ABNORMAL HIGH (ref 70–99)
Glucose-Capillary: 225 mg/dL — ABNORMAL HIGH (ref 70–99)
Glucose-Capillary: 132 mg/dL — ABNORMAL HIGH (ref 70–99)

## 2011-09-29 LAB — BASIC METABOLIC PANEL
GFR calc Af Amer: 89 mL/min — ABNORMAL LOW (ref >90)
Glucose, Bld: 153 mg/dL — ABNORMAL HIGH (ref 70–99)
Sodium: 138 mEq/L (ref 135–145)

## 2011-09-29 LAB — CBC
MCH: 28.7 pg (ref 26.0–34.0)
MCHC: 32.1 g/dL (ref 30.0–36.0)
Platelets: 207 10*3/uL (ref 150–400)

## 2011-09-29 MED ORDER — DEXTROSE 5 % IV SOLN: 1.0000 g | Freq: Once | INTRAVENOUS | Status: DC

## 2011-09-29 MED ORDER — GLIMEPIRIDE 1 MG PO TABS
1.0000 mg | ORAL_TABLET | Freq: Every day | ORAL | Status: DC
  Administered 2011-09-30: 1 mg via ORAL
  Filled 2011-09-29 (×3): qty 1

## 2011-09-29 MED ORDER — METOPROLOL SUCCINATE ER 25 MG PO TB24
25.0000 mg | ORAL_TABLET | Freq: Every day | ORAL | Status: DC
Start: 2011-09-29 — End: 2011-09-30
  Administered 2011-09-29 – 2011-09-30 (×2): 25 mg via ORAL
  Filled 2011-09-29 (×2): qty 1

## 2011-09-29 NOTE — Progress Notes (Signed)
Physical Therapy Treatment Patient Details Name: Heidi Dominguez MRN: [id number] DOB: 04-26-1921 Today's Date: 09/29/2011  PT Assessment/Plan  PT - Assessment/Plan Comments on Treatment Session: Pt with improved mobility but fatiqued after amb to BR. PT Plan: Discharge plan remains appropriate PT Frequency: Min 3X/week Recommendations for Other Services: OT consult Follow Up Recommendations: Home health PT PT Goals  Acute Rehab PT Goals PT Goal: Supine/Side to Sit - Progress: Progressing toward goal PT Goal: Sit to Stand - Progress: Progressing toward goal PT Goal: Stand to Sit - Progress: Progressing toward goal PT Goal: Ambulate - Progress: Progressing toward goal  PT Treatment Precautions/Restrictions  Precautions Precautions: Fall Restrictions Weight Bearing Restrictions: No Other Position/Activity Restrictions: Pt with bloody loose stool during PT eval; would recommend using a Depend-like brief for mobilizing/ambulation, and then take it off as pt's son is adamant about pt not wearing pull-up-type briefs Mobility (including Balance) Bed Mobility Bed Mobility: Yes Rolling Right: 5: Supervision;With rail Rolling Left: 5: Supervision;With rail Supine to Sit: 4: Min assist;HOB flat Supine to Sit Details (indicate cue type and reason): cues for technique Sitting - Scoot to Edge of Bed: 4: Min assist Sitting - Scoot to [place] of Bed Details (indicate cue type and reason): cues for technique Sit to Supine: Not Tested (comment) Transfers Transfers: Yes Sit to Stand: 4: Min assist;From bed;With upper extremity assist;From toilet;3: Mod assist Sit to Stand Details (indicate cue type and reason): Min assist from bed but mod assist from toilet  Needs assist to clear bottom off bed and toilet. Stand to Sit: 4: Min assist;With upper extremity assist;To chair/3-in-1;To toilet Ambulation/Gait Ambulation/Gait: Yes Ambulation/Gait Assistance: 4: Min assist Ambulation/Gait Assistance  Details (indicate cue type and reason): cues for flexed posture and to maneuver RW Ambulation Distance (Feet): 15 Feet (twice) Assistive device: Rolling walker Gait Pattern: Decreased step length - right;Decreased step length - left;Decreased stride length;Step-through pattern;Trunk flexed Stairs: No    Exercise    End of Session PT - End of Session Equipment Utilized During Treatment: Gait belt Activity Tolerance: Patient tolerated treatment well Patient left: in chair;with call bell in reach Nurse Communication: Mobility status for transfers;Mobility status for ambulation General Behavior During Session: [facility] for tasks performed Cognition: [facility] for tasks performed  Newell Coral 09/29/2011, 1:16 PM  Newell Coral, PTA [phone]

## 2011-09-29 NOTE — Progress Notes (Signed)
Subjective:  Patient seen per family and patient request. Doing better. Able to tolerate PO feedings. Improved WBC count.  Objective:  Vital Signs in the last 24 hours: Temp:  [98.2 F (36.8 C)-99.8 F (37.7 C)] 98.2 F (36.8 C) (02/15 1406) Pulse Rate:  [95-103] 103  (02/15 1406) Cardiac Rhythm:  [-] Sinus tachycardia (02/14 2100) Resp:  [18-20] 20  (02/15 1406) BP: (127-150)/(46-72) 127/46 mmHg (02/15 1406) SpO2:  [95 %-98 %] 98 % (02/15 1406) Weight:  [77.157 kg (170 lb 1.6 oz)] 77.157 kg (170 lb 1.6 oz) (02/15 0510)  Physical Exam: BP Readings from Last 1 Encounters:  09/29/11 127/46    Wt Readings from Last 1 Encounters:  09/29/11 77.157 kg (170 lb 1.6 oz)    Weight change: 1.769 kg (3 lb 14.4 oz)  HEENT: [place]/AT, Eyes-Brown, PERL, EOMI, Conjunctiva-Pale pink, Sclera-Non-icteric Neck: No JVD, No bruit, Trachea midline. Lungs:  Clear, Bilateral. Cardiac:  Regular rhythm, normal S1 and S2, no S3. III/VI systolic murmur right and left sternal borders. Abdomen:  Soft, non-tender. Extremities:  No edema present. No cyanosis. No clubbing. CNS: AxOx3, Cranial nerves grossly intact, moves all 4 extremities. Right handed. Skin: Warm and dry.   Intake/Output from previous day: 02/14 0701 - 02/15 0700 In: 5956.7 [P.O.:1620; I.V.:4236.7; IV Piggyback:100] Out: 100 [Urine:100]    Lab Results: BMET    Component Value Date/Time   NA 138 09/29/2011 0500   K 4.2 09/29/2011 0500   CL 100 09/29/2011 0500   CO2 32 09/29/2011 0500   GLUCOSE 153* 09/29/2011 0500   BUN 7 09/29/2011 0500   CREATININE 0.65 09/29/2011 0500   CALCIUM 9.9 09/29/2011 0500   GFRNONAA 77* 09/29/2011 0500   GFRAA 89* 09/29/2011 0500   CBC    Component Value Date/Time   WBC 8.9 09/29/2011 0500   RBC 3.63* 09/29/2011 0500   HGB 10.4* 09/29/2011 0500   HCT 32.4* 09/29/2011 0500   PLT 207 09/29/2011 0500   MCV 89.3 09/29/2011 0500   MCH 28.7 09/29/2011 0500   MCHC 32.1 09/29/2011 0500   RDW 14.6 09/29/2011 0500   LYMPHSABS 1.2 06/04/2011 1400   MONOABS 0.9 06/04/2011 1400   EOSABS 0.1 06/04/2011 1400   BASOSABS 0.0 06/04/2011 1400   CARDIAC ENZYMES Lab Results  Component Value Date   CKTOTAL 41 09/25/2011   CKMB 1.2 09/25/2011   TROPONINI <0.30 09/25/2011    Assessment/Plan:  Patient Active Hospital Problem List:  Bradycardia (09/24/2011) now tachycardia Assessment: Improving off B-blocker Plan: Resume small dose B-blocker UTI (lower urinary tract infection) (09/24/2011) Assessment: Stable Plan: Continue antibiotic Hematochezia (09/24/2011) Assessment: ? Stable Plan: Monitor H/H per GI/Dr. Buccini DM, II (diabetes mellitus) (09/24/2011) Assessment: Stable Plan: Continue medications HTN (hypertension) (09/24/2011) Assessment: Controlled with Bidil Heart Murmur  (2-D echo- EF 50-55 %, moderate LVH)  Calcific AV with Very mild AS, Mild MR and TR. Dyslipidemia (09/24/2011) Assessment: Stable Dysphagia  Speech therapy evaluation appreciated. .  Hypokalemia -Corrected    LOS: 5 days    Heidi Cobb  MD  09/29/2011, 5:16 PM

## 2011-09-29 NOTE — Plan of Care (Signed)
Problem: Phase I Progression Outcomes Goal: Voiding-avoid urinary catheter unless indicated Outcome: Completed/Met Date Met:  09/29/11 Patient doesn't have foley cath.

## 2011-09-29 NOTE — Progress Notes (Signed)
Subjective: Asymptomatic, ambulant.  Objective: Vital signs in last 24 hours: Temp:  [98.2 F (36.8 C)-99.8 F (37.7 C)] 98.2 F (36.8 C) (02/15 1406) Pulse Rate:  [95-103] 103  (02/15 1406) Resp:  [18-20] 20  (02/15 1406) BP: (127-150)/(46-72) 127/46 mmHg (02/15 1406) SpO2:  [95 %-98 %] 98 % (02/15 1406) Weight:  [77.157 kg (170 lb 1.6 oz)] 77.157 kg (170 lb 1.6 oz) (02/15 0510) Weight change: 1.769 kg (3 lb 14.4 oz) Last BM Date: 09/26/11  Intake/Output from previous day: 02/14 0701 - 02/15 0700 In: 5956.7 [P.O.:1620; I.V.:4236.7; IV Piggyback:100] Out: 100 [Urine:100] Total I/O In: 800 [P.O.:800] Out: 1 [Stool:1]   Physical Exam: General: Sitting in chair, comfortable, alert, communicative, fully oriented, not short of breath at rest.  HEENT:  Mild clinical pallor, no jaundice, no conjunctival injection or discharge. Hydration status is satisfactory. NECK:  Supple, JVP not seen, no carotid bruits, no palpable lymphadenopathy, no palpable goiter. CHEST:  Clinically clear to auscultation, no wheezes, no crackles. HEART:  Sounds 1 and 2 heard, normal, regular, no murmurs. ABDOMEN:  Full, soft, non-tender, no palpable organomegaly, no palpable masses, normal bowel sounds. GENITALIA:  Not examined. LOWER EXTREMITIES:  No pitting edema, palpable peripheral pulses. MUSCULOSKELETAL SYSTEM:  Generalized osteoarthritic changes, otherwise, normal. CENTRAL NERVOUS SYSTEM:  No focal neurologic deficit on gross examination.  Lab Results:  Basename 09/29/11 0500 09/28/11 0650  WBC 8.9 10.9*  HGB 10.4* 10.2*  HCT 32.4* 31.8*  PLT 207 191    Basename 09/29/11 0500 09/28/11 0650  NA 138 136  K 4.2 4.3  CL 100 101  CO2 32 29  GLUCOSE 153* 154*  BUN 7 8  CREATININE 0.65 0.64  CALCIUM 9.9 9.5   Recent Results (from the past 240 hour(s))  URINE CULTURE     Status: Normal   Collection Time   09/24/11  1:48 PM      Component Value Range Status Comment   Specimen Description  URINE, CATHETERIZED   Final    Special Requests NONE   Final    Culture  Setup Time [id number]   Final    Colony Count >=100,000 COLONIES/ML   Final    Culture CITROBACTER FREUNDII   Final    Report Status 09/26/2011 FINAL   Final    Organism ID, Bacteria CITROBACTER FREUNDII   Final   STOOL CULTURE     Status: Normal   Collection Time   09/24/11  3:00 PM      Component Value Range Status Comment   Specimen Description STOOL   Final    Special Requests Normal   Final    Culture     Final    Value: NO SALMONELLA, SHIGELLA, CAMPYLOBACTER, OR YERSINIA ISOLATED   Report Status 09/28/2011 FINAL   Final   URINE CULTURE     Status: Normal   Collection Time   09/24/11  8:50 PM      Component Value Range Status Comment   Specimen Description URINE, CATHETERIZED   Final    Special Requests NONE   Final    Culture  Setup Time [id number]   Final    Colony Count 2,000 COLONIES/ML   Final    Culture INSIGNIFICANT GROWTH   Final    Report Status 09/26/2011 FINAL   Final      Studies/Results: No results found.  Medications: Scheduled Meds:    . amLODipine  5 mg Oral Daily   And  . benazepril  20 mg Oral Daily  .  B-complex with vitamin C  1-2 tablet Oral Daily  . cefTRIAXone (ROCEPHIN)  IV  1 g Intravenous Q24H  . cholecalciferol  5,000 Units Oral Daily  . vitamin B-12  500 mcg Oral Daily  . docusate sodium  100 mg Oral BID  . ferrous gluconate  324 mg Oral Q breakfast  . insulin aspart  0-9 Units Subcutaneous TID WC  . isosorbide-hydrALAZINE  1 tablet Oral BID  . linagliptin  5 mg Oral Q breakfast  . metoprolol succinate  25 mg Oral Daily  . saccharomyces boulardii  250 mg Oral BID  . senna  1 tablet Oral BID  . simvastatin  20 mg Oral q1800  . sodium chloride  3 mL Intravenous Q12H  . DISCONTD: sodium chloride  10 mL Intravenous Q12H   Continuous Infusions:   PRN Meds:.acetaminophen, acetaminophen, albuterol, alum & mag hydroxide-simeth, hydrALAZINE, morphine, ondansetron  (ZOFRAN) IV, ondansetron, traMADol  Assessment/Plan:  Principal Problem:  *Weakness: Multi-factorial, secondary to UTI, bradycardia and electrolyte abnormalities. Now much improved, with resolution/improvement of acute problems.  Active Problems:  1. Bradycardia: Secondary to beta blockers, which have been held. HR is ranging between 84-92. Cardiology consultation was provided by Dr Algie Coffer. Patient has declined further cardiology work up, and will follow up with Dr Algie Coffer, as needed, on an outpatient basis, should any further problems arise. Seen by Dr Algie Coffer today, and he has re-commenced low dose beta-blocker, due to borderline, high heart rate.  2. UTI (lower urinary tract infection): Now on day # 6/7 Rocephin. Urine cultures grew pan-sensitive Citrobacter Freundii. Patient has remained apyrexial, and wcc is has normalized.  3. Hematochezia: This occurred on day of admission. Dr Molly Maduro Buccini provided GI consultation, and has opined that patient has quiescent lower GI bleed, probably diverticular origin. There have been no recurrences, hemoglobin has remained stable and therefore, lower GI endoscopy was not done. GI recommends observation for now. GI follow up with Dr Madilyn Fireman, has been arranged on discharge. GI has signed off on 09/28/11.  4. DM (diabetes mellitus): Reasonably controlled on diet, SSI and Tradjenta. Will recommence pre-admission Amaryl. Metformin will not be recommenced, secondary to age.  5. HTN (hypertension): Controlled on Amlodipine and Benazepril.  6. Dyslipidemia: On statin.  7. Hypokalemia: Repleting as appropriate. Now resolved.  8. Constipation. Patient is already on laxatives. Responded to MOM.  Comment: Seen by PT/OT. Recommended HHPT/RN.  Patient is clinically stable for discharge in AM of 09/30/11.  LOS: 5 days   Heidi Dominguez,[name] 09/29/2011, 6:06 PM

## 2011-09-29 NOTE — Progress Notes (Signed)
   CARE MANAGEMENT NOTE 09/29/2011  Patient:  Heidi Dominguez, Heidi Dominguez   Account Number:  [id number]  Date Initiated:  09/28/2011  Documentation initiated by:  Nitza Schmid  Subjective/Objective Assessment:   78 yr-old female adm with bradycardia, UTI; lives alone.        DC Planning Services  CM consult      [facility] Choice  HOME HEALTH   Choice offered to / List presented to:  C-1 Patient        HH arranged  HH-1 RN  HH-2 PT      [facility] agency  [facility]   Status of service:  Completed, signed off  Discharge Disposition:  HOME W HOME HEALTH SERVICES  Comments:  PCP:  Dr. Reather Littler  Contact:  [name], daughter  [phone]  09/28/11 1030 Yarieliz Wasser RN MSN CCM Daughter states she and other family members are arranging 24 hr care for pt.  Answered daughter's questions re Medicare-covered services.  PT recommends home health plus pt will also need home health RN - pt and daughter agree. Referral made per daughter's choice.

## 2011-09-30 LAB — GLUCOSE, CAPILLARY: Glucose-Capillary: 144 mg/dL — ABNORMAL HIGH (ref 70–99)

## 2011-09-30 MED ORDER — METOPROLOL SUCCINATE ER 25 MG PO TB24: 25.0000 mg | ORAL_TABLET | Freq: Every day | ORAL | Status: DC

## 2011-09-30 MED ORDER — ISOSORB DINITRATE-HYDRALAZINE 20-37.5 MG PO TABS: 1.0000 | ORAL_TABLET | Freq: Two times a day (BID) | ORAL | Status: DC

## 2011-10-01 NOTE — Progress Notes (Signed)
[facility] aware of pt's D/c on 2/16 home with [facility] RN and [facility] PT. Printed orders for [facility] for rep. Isidoro Donning RN CCM Case Mgmt phone [phone]

## 2011-10-02 DIAGNOSIS — N390 Urinary tract infection, site not specified: Secondary | ICD-10-CM | POA: Diagnosis not present

## 2011-10-02 DIAGNOSIS — D649 Anemia, unspecified: Secondary | ICD-10-CM | POA: Diagnosis not present

## 2011-10-02 DIAGNOSIS — R262 Difficulty in walking, not elsewhere classified: Secondary | ICD-10-CM | POA: Diagnosis not present

## 2011-10-02 DIAGNOSIS — E119 Type 2 diabetes mellitus without complications: Secondary | ICD-10-CM | POA: Diagnosis not present

## 2011-10-02 DIAGNOSIS — I10 Essential (primary) hypertension: Secondary | ICD-10-CM | POA: Diagnosis not present

## 2011-10-05 DIAGNOSIS — R262 Difficulty in walking, not elsewhere classified: Secondary | ICD-10-CM | POA: Diagnosis not present

## 2011-10-05 DIAGNOSIS — E119 Type 2 diabetes mellitus without complications: Secondary | ICD-10-CM | POA: Diagnosis not present

## 2011-10-05 DIAGNOSIS — I10 Essential (primary) hypertension: Secondary | ICD-10-CM | POA: Diagnosis not present

## 2011-10-05 DIAGNOSIS — D649 Anemia, unspecified: Secondary | ICD-10-CM | POA: Diagnosis not present

## 2011-10-05 DIAGNOSIS — N390 Urinary tract infection, site not specified: Secondary | ICD-10-CM | POA: Diagnosis not present

## 2011-10-09 DIAGNOSIS — N390 Urinary tract infection, site not specified: Secondary | ICD-10-CM | POA: Diagnosis not present

## 2011-10-09 DIAGNOSIS — R262 Difficulty in walking, not elsewhere classified: Secondary | ICD-10-CM | POA: Diagnosis not present

## 2011-10-09 DIAGNOSIS — E119 Type 2 diabetes mellitus without complications: Secondary | ICD-10-CM | POA: Diagnosis not present

## 2011-10-09 DIAGNOSIS — I10 Essential (primary) hypertension: Secondary | ICD-10-CM | POA: Diagnosis not present

## 2011-10-09 DIAGNOSIS — D649 Anemia, unspecified: Secondary | ICD-10-CM | POA: Diagnosis not present

## 2011-10-10 DIAGNOSIS — K921 Melena: Secondary | ICD-10-CM | POA: Diagnosis not present

## 2011-10-11 DIAGNOSIS — E119 Type 2 diabetes mellitus without complications: Secondary | ICD-10-CM | POA: Diagnosis not present

## 2011-10-11 DIAGNOSIS — D649 Anemia, unspecified: Secondary | ICD-10-CM | POA: Diagnosis not present

## 2011-10-11 DIAGNOSIS — R262 Difficulty in walking, not elsewhere classified: Secondary | ICD-10-CM | POA: Diagnosis not present

## 2011-10-11 DIAGNOSIS — I10 Essential (primary) hypertension: Secondary | ICD-10-CM | POA: Diagnosis not present

## 2011-10-11 DIAGNOSIS — N390 Urinary tract infection, site not specified: Secondary | ICD-10-CM | POA: Diagnosis not present

## 2011-10-12 DIAGNOSIS — E119 Type 2 diabetes mellitus without complications: Secondary | ICD-10-CM | POA: Diagnosis not present

## 2011-10-12 DIAGNOSIS — D649 Anemia, unspecified: Secondary | ICD-10-CM | POA: Diagnosis not present

## 2011-10-12 DIAGNOSIS — I10 Essential (primary) hypertension: Secondary | ICD-10-CM | POA: Diagnosis not present

## 2011-10-12 DIAGNOSIS — R262 Difficulty in walking, not elsewhere classified: Secondary | ICD-10-CM | POA: Diagnosis not present

## 2011-10-12 DIAGNOSIS — N390 Urinary tract infection, site not specified: Secondary | ICD-10-CM | POA: Diagnosis not present

## 2011-10-13 DIAGNOSIS — N390 Urinary tract infection, site not specified: Secondary | ICD-10-CM | POA: Diagnosis not present

## 2011-10-13 DIAGNOSIS — I10 Essential (primary) hypertension: Secondary | ICD-10-CM | POA: Diagnosis not present

## 2011-10-13 DIAGNOSIS — R262 Difficulty in walking, not elsewhere classified: Secondary | ICD-10-CM | POA: Diagnosis not present

## 2011-10-13 DIAGNOSIS — D649 Anemia, unspecified: Secondary | ICD-10-CM | POA: Diagnosis not present

## 2011-10-13 DIAGNOSIS — E119 Type 2 diabetes mellitus without complications: Secondary | ICD-10-CM | POA: Diagnosis not present

## 2011-10-16 DIAGNOSIS — I10 Essential (primary) hypertension: Secondary | ICD-10-CM | POA: Diagnosis not present

## 2011-10-16 DIAGNOSIS — R262 Difficulty in walking, not elsewhere classified: Secondary | ICD-10-CM | POA: Diagnosis not present

## 2011-10-16 DIAGNOSIS — E119 Type 2 diabetes mellitus without complications: Secondary | ICD-10-CM | POA: Diagnosis not present

## 2011-10-16 DIAGNOSIS — D649 Anemia, unspecified: Secondary | ICD-10-CM | POA: Diagnosis not present

## 2011-10-16 DIAGNOSIS — N390 Urinary tract infection, site not specified: Secondary | ICD-10-CM | POA: Diagnosis not present

## 2011-10-17 DIAGNOSIS — I10 Essential (primary) hypertension: Secondary | ICD-10-CM | POA: Diagnosis not present

## 2011-10-17 DIAGNOSIS — D649 Anemia, unspecified: Secondary | ICD-10-CM | POA: Diagnosis not present

## 2011-10-17 DIAGNOSIS — E119 Type 2 diabetes mellitus without complications: Secondary | ICD-10-CM | POA: Diagnosis not present

## 2011-10-17 DIAGNOSIS — N390 Urinary tract infection, site not specified: Secondary | ICD-10-CM | POA: Diagnosis not present

## 2011-10-17 DIAGNOSIS — R262 Difficulty in walking, not elsewhere classified: Secondary | ICD-10-CM | POA: Diagnosis not present

## 2011-10-19 DIAGNOSIS — E119 Type 2 diabetes mellitus without complications: Secondary | ICD-10-CM | POA: Diagnosis not present

## 2011-10-19 DIAGNOSIS — D649 Anemia, unspecified: Secondary | ICD-10-CM | POA: Diagnosis not present

## 2011-10-19 DIAGNOSIS — R262 Difficulty in walking, not elsewhere classified: Secondary | ICD-10-CM | POA: Diagnosis not present

## 2011-10-19 DIAGNOSIS — N390 Urinary tract infection, site not specified: Secondary | ICD-10-CM | POA: Diagnosis not present

## 2011-10-19 DIAGNOSIS — I10 Essential (primary) hypertension: Secondary | ICD-10-CM | POA: Diagnosis not present

## 2011-10-20 DIAGNOSIS — N390 Urinary tract infection, site not specified: Secondary | ICD-10-CM | POA: Diagnosis not present

## 2011-10-20 DIAGNOSIS — I10 Essential (primary) hypertension: Secondary | ICD-10-CM | POA: Diagnosis not present

## 2011-10-20 DIAGNOSIS — E119 Type 2 diabetes mellitus without complications: Secondary | ICD-10-CM | POA: Diagnosis not present

## 2011-10-20 DIAGNOSIS — R262 Difficulty in walking, not elsewhere classified: Secondary | ICD-10-CM | POA: Diagnosis not present

## 2011-10-20 DIAGNOSIS — D649 Anemia, unspecified: Secondary | ICD-10-CM | POA: Diagnosis not present

## 2011-10-23 DIAGNOSIS — R262 Difficulty in walking, not elsewhere classified: Secondary | ICD-10-CM | POA: Diagnosis not present

## 2011-10-23 DIAGNOSIS — N390 Urinary tract infection, site not specified: Secondary | ICD-10-CM | POA: Diagnosis not present

## 2011-10-23 DIAGNOSIS — E119 Type 2 diabetes mellitus without complications: Secondary | ICD-10-CM | POA: Diagnosis not present

## 2011-10-23 DIAGNOSIS — I10 Essential (primary) hypertension: Secondary | ICD-10-CM | POA: Diagnosis not present

## 2011-10-23 DIAGNOSIS — D649 Anemia, unspecified: Secondary | ICD-10-CM | POA: Diagnosis not present

## 2011-10-25 DIAGNOSIS — N390 Urinary tract infection, site not specified: Secondary | ICD-10-CM | POA: Diagnosis not present

## 2011-10-25 DIAGNOSIS — R300 Dysuria: Secondary | ICD-10-CM | POA: Diagnosis not present

## 2011-10-26 DIAGNOSIS — I10 Essential (primary) hypertension: Secondary | ICD-10-CM | POA: Diagnosis not present

## 2011-10-26 DIAGNOSIS — D649 Anemia, unspecified: Secondary | ICD-10-CM | POA: Diagnosis not present

## 2011-10-26 DIAGNOSIS — E119 Type 2 diabetes mellitus without complications: Secondary | ICD-10-CM | POA: Diagnosis not present

## 2011-10-26 DIAGNOSIS — R262 Difficulty in walking, not elsewhere classified: Secondary | ICD-10-CM | POA: Diagnosis not present

## 2011-10-26 DIAGNOSIS — N390 Urinary tract infection, site not specified: Secondary | ICD-10-CM | POA: Diagnosis not present

## 2011-10-27 DIAGNOSIS — N390 Urinary tract infection, site not specified: Secondary | ICD-10-CM | POA: Diagnosis not present

## 2011-10-27 DIAGNOSIS — D649 Anemia, unspecified: Secondary | ICD-10-CM | POA: Diagnosis not present

## 2011-10-27 DIAGNOSIS — E119 Type 2 diabetes mellitus without complications: Secondary | ICD-10-CM | POA: Diagnosis not present

## 2011-10-27 DIAGNOSIS — R262 Difficulty in walking, not elsewhere classified: Secondary | ICD-10-CM | POA: Diagnosis not present

## 2011-10-27 DIAGNOSIS — I10 Essential (primary) hypertension: Secondary | ICD-10-CM | POA: Diagnosis not present

## 2011-10-30 DIAGNOSIS — D649 Anemia, unspecified: Secondary | ICD-10-CM | POA: Diagnosis not present

## 2011-10-30 DIAGNOSIS — N390 Urinary tract infection, site not specified: Secondary | ICD-10-CM | POA: Diagnosis not present

## 2011-10-30 DIAGNOSIS — I10 Essential (primary) hypertension: Secondary | ICD-10-CM | POA: Diagnosis not present

## 2011-10-30 DIAGNOSIS — E119 Type 2 diabetes mellitus without complications: Secondary | ICD-10-CM | POA: Diagnosis not present

## 2011-10-30 DIAGNOSIS — R262 Difficulty in walking, not elsewhere classified: Secondary | ICD-10-CM | POA: Diagnosis not present

## 2011-11-01 DIAGNOSIS — E785 Hyperlipidemia, unspecified: Secondary | ICD-10-CM | POA: Diagnosis not present

## 2011-11-01 DIAGNOSIS — E119 Type 2 diabetes mellitus without complications: Secondary | ICD-10-CM | POA: Diagnosis not present

## 2011-11-01 DIAGNOSIS — I10 Essential (primary) hypertension: Secondary | ICD-10-CM | POA: Diagnosis not present

## 2011-11-02 DIAGNOSIS — N390 Urinary tract infection, site not specified: Secondary | ICD-10-CM | POA: Diagnosis not present

## 2011-11-02 DIAGNOSIS — D649 Anemia, unspecified: Secondary | ICD-10-CM | POA: Diagnosis not present

## 2011-11-02 DIAGNOSIS — I10 Essential (primary) hypertension: Secondary | ICD-10-CM | POA: Diagnosis not present

## 2011-11-02 DIAGNOSIS — R262 Difficulty in walking, not elsewhere classified: Secondary | ICD-10-CM | POA: Diagnosis not present

## 2011-11-02 DIAGNOSIS — E119 Type 2 diabetes mellitus without complications: Secondary | ICD-10-CM | POA: Diagnosis not present

## 2011-11-03 DIAGNOSIS — Q828 Other specified congenital malformations of skin: Secondary | ICD-10-CM | POA: Diagnosis not present

## 2011-11-03 DIAGNOSIS — E1149 Type 2 diabetes mellitus with other diabetic neurological complication: Secondary | ICD-10-CM | POA: Diagnosis not present

## 2011-11-03 DIAGNOSIS — L608 Other nail disorders: Secondary | ICD-10-CM | POA: Diagnosis not present

## 2011-11-06 DIAGNOSIS — E119 Type 2 diabetes mellitus without complications: Secondary | ICD-10-CM | POA: Diagnosis not present

## 2011-11-06 DIAGNOSIS — R262 Difficulty in walking, not elsewhere classified: Secondary | ICD-10-CM | POA: Diagnosis not present

## 2011-11-06 DIAGNOSIS — D649 Anemia, unspecified: Secondary | ICD-10-CM | POA: Diagnosis not present

## 2011-11-06 DIAGNOSIS — I10 Essential (primary) hypertension: Secondary | ICD-10-CM | POA: Diagnosis not present

## 2011-11-06 DIAGNOSIS — N390 Urinary tract infection, site not specified: Secondary | ICD-10-CM | POA: Diagnosis not present

## 2011-11-08 DIAGNOSIS — N390 Urinary tract infection, site not specified: Secondary | ICD-10-CM | POA: Diagnosis not present

## 2011-11-08 DIAGNOSIS — I10 Essential (primary) hypertension: Secondary | ICD-10-CM | POA: Diagnosis not present

## 2011-11-08 DIAGNOSIS — E119 Type 2 diabetes mellitus without complications: Secondary | ICD-10-CM | POA: Diagnosis not present

## 2011-11-08 DIAGNOSIS — R262 Difficulty in walking, not elsewhere classified: Secondary | ICD-10-CM | POA: Diagnosis not present

## 2011-11-08 DIAGNOSIS — D649 Anemia, unspecified: Secondary | ICD-10-CM | POA: Diagnosis not present

## 2011-11-13 DIAGNOSIS — N390 Urinary tract infection, site not specified: Secondary | ICD-10-CM | POA: Diagnosis not present

## 2011-11-13 DIAGNOSIS — R350 Frequency of micturition: Secondary | ICD-10-CM | POA: Diagnosis not present

## 2011-11-16 NOTE — ED Provider Notes (Signed)
Medical screening examination/treatment/procedure(s) were performed by non-physician practitioner and as supervising physician I was immediately available for consultation/collaboration.  Dent Plantz R. Keirstyn Aydt, MD 11/16/11 2358 

## 2011-12-05 DIAGNOSIS — R350 Frequency of micturition: Secondary | ICD-10-CM | POA: Diagnosis not present

## 2011-12-07 DIAGNOSIS — D649 Anemia, unspecified: Secondary | ICD-10-CM | POA: Diagnosis not present

## 2011-12-07 DIAGNOSIS — I10 Essential (primary) hypertension: Secondary | ICD-10-CM | POA: Diagnosis not present

## 2011-12-07 DIAGNOSIS — E785 Hyperlipidemia, unspecified: Secondary | ICD-10-CM | POA: Diagnosis not present

## 2011-12-07 DIAGNOSIS — E119 Type 2 diabetes mellitus without complications: Secondary | ICD-10-CM | POA: Diagnosis not present

## 2011-12-13 DIAGNOSIS — I10 Essential (primary) hypertension: Secondary | ICD-10-CM | POA: Diagnosis not present

## 2011-12-13 DIAGNOSIS — E119 Type 2 diabetes mellitus without complications: Secondary | ICD-10-CM | POA: Diagnosis not present

## 2011-12-13 DIAGNOSIS — E785 Hyperlipidemia, unspecified: Secondary | ICD-10-CM | POA: Diagnosis not present

## 2011-12-13 DIAGNOSIS — I2510 Atherosclerotic heart disease of native coronary artery without angina pectoris: Secondary | ICD-10-CM | POA: Diagnosis not present

## 2011-12-13 DIAGNOSIS — E559 Vitamin D deficiency, unspecified: Secondary | ICD-10-CM | POA: Diagnosis not present

## 2011-12-25 DIAGNOSIS — M1990 Unspecified osteoarthritis, unspecified site: Secondary | ICD-10-CM | POA: Diagnosis not present

## 2012-01-01 DIAGNOSIS — Z961 Presence of intraocular lens: Secondary | ICD-10-CM | POA: Diagnosis not present

## 2012-01-01 DIAGNOSIS — E1139 Type 2 diabetes mellitus with other diabetic ophthalmic complication: Secondary | ICD-10-CM | POA: Diagnosis not present

## 2012-01-01 DIAGNOSIS — E11319 Type 2 diabetes mellitus with unspecified diabetic retinopathy without macular edema: Secondary | ICD-10-CM | POA: Diagnosis not present

## 2012-01-04 DIAGNOSIS — E11359 Type 2 diabetes mellitus with proliferative diabetic retinopathy without macular edema: Secondary | ICD-10-CM | POA: Diagnosis not present

## 2012-01-04 DIAGNOSIS — E1139 Type 2 diabetes mellitus with other diabetic ophthalmic complication: Secondary | ICD-10-CM | POA: Diagnosis not present

## 2012-01-04 DIAGNOSIS — E11311 Type 2 diabetes mellitus with unspecified diabetic retinopathy with macular edema: Secondary | ICD-10-CM | POA: Diagnosis not present

## 2012-01-04 DIAGNOSIS — H43819 Vitreous degeneration, unspecified eye: Secondary | ICD-10-CM | POA: Diagnosis not present

## 2012-01-17 DIAGNOSIS — E785 Hyperlipidemia, unspecified: Secondary | ICD-10-CM | POA: Diagnosis not present

## 2012-01-17 DIAGNOSIS — E119 Type 2 diabetes mellitus without complications: Secondary | ICD-10-CM | POA: Diagnosis not present

## 2012-01-17 DIAGNOSIS — I10 Essential (primary) hypertension: Secondary | ICD-10-CM | POA: Diagnosis not present

## 2012-01-17 DIAGNOSIS — I2510 Atherosclerotic heart disease of native coronary artery without angina pectoris: Secondary | ICD-10-CM | POA: Diagnosis not present

## 2012-01-18 DIAGNOSIS — E1139 Type 2 diabetes mellitus with other diabetic ophthalmic complication: Secondary | ICD-10-CM | POA: Diagnosis not present

## 2012-01-18 DIAGNOSIS — E11311 Type 2 diabetes mellitus with unspecified diabetic retinopathy with macular edema: Secondary | ICD-10-CM | POA: Diagnosis not present

## 2012-01-18 DIAGNOSIS — E11319 Type 2 diabetes mellitus with unspecified diabetic retinopathy without macular edema: Secondary | ICD-10-CM | POA: Diagnosis not present

## 2012-01-18 DIAGNOSIS — R350 Frequency of micturition: Secondary | ICD-10-CM | POA: Diagnosis not present

## 2012-02-08 DIAGNOSIS — E11311 Type 2 diabetes mellitus with unspecified diabetic retinopathy with macular edema: Secondary | ICD-10-CM | POA: Diagnosis not present

## 2012-02-08 DIAGNOSIS — E1139 Type 2 diabetes mellitus with other diabetic ophthalmic complication: Secondary | ICD-10-CM | POA: Diagnosis not present

## 2012-02-08 DIAGNOSIS — E11319 Type 2 diabetes mellitus with unspecified diabetic retinopathy without macular edema: Secondary | ICD-10-CM | POA: Diagnosis not present

## 2012-02-09 DIAGNOSIS — E119 Type 2 diabetes mellitus without complications: Secondary | ICD-10-CM | POA: Diagnosis not present

## 2012-02-09 DIAGNOSIS — I10 Essential (primary) hypertension: Secondary | ICD-10-CM | POA: Diagnosis not present

## 2012-02-13 DIAGNOSIS — L608 Other nail disorders: Secondary | ICD-10-CM | POA: Diagnosis not present

## 2012-02-13 DIAGNOSIS — E1149 Type 2 diabetes mellitus with other diabetic neurological complication: Secondary | ICD-10-CM | POA: Diagnosis not present

## 2012-02-20 DIAGNOSIS — I2510 Atherosclerotic heart disease of native coronary artery without angina pectoris: Secondary | ICD-10-CM | POA: Diagnosis not present

## 2012-02-20 DIAGNOSIS — E119 Type 2 diabetes mellitus without complications: Secondary | ICD-10-CM | POA: Diagnosis not present

## 2012-02-20 DIAGNOSIS — E785 Hyperlipidemia, unspecified: Secondary | ICD-10-CM | POA: Diagnosis not present

## 2012-02-20 DIAGNOSIS — I10 Essential (primary) hypertension: Secondary | ICD-10-CM | POA: Diagnosis not present

## 2012-02-26 ENCOUNTER — Inpatient Hospital Stay (HOSPITAL_COMMUNITY)
Admission: EM | Admit: 2012-02-26 | Discharge: 2012-02-29 | DRG: 293 | Disposition: A | Discharge status: Home / Self Care | Payer: Medicare Other | Attending: Cardiovascular Disease | Admitting: Cardiovascular Disease

## 2012-02-26 ENCOUNTER — Encounter (HOSPITAL_COMMUNITY): Payer: Self-pay | Admitting: *Deleted

## 2012-02-26 ENCOUNTER — Emergency Department (HOSPITAL_COMMUNITY): Payer: Medicare Other

## 2012-02-26 DIAGNOSIS — R55 Syncope and collapse: Secondary | ICD-10-CM

## 2012-02-26 DIAGNOSIS — M19019 Primary osteoarthritis, unspecified shoulder: Secondary | ICD-10-CM | POA: Diagnosis present

## 2012-02-26 DIAGNOSIS — I10 Essential (primary) hypertension: Secondary | ICD-10-CM | POA: Diagnosis not present

## 2012-02-26 DIAGNOSIS — R0602 Shortness of breath: Secondary | ICD-10-CM | POA: Diagnosis not present

## 2012-02-26 DIAGNOSIS — E119 Type 2 diabetes mellitus without complications: Secondary | ICD-10-CM | POA: Diagnosis not present

## 2012-02-26 DIAGNOSIS — R0789 Other chest pain: Secondary | ICD-10-CM | POA: Diagnosis not present

## 2012-02-26 DIAGNOSIS — I5021 Acute systolic (congestive) heart failure: Secondary | ICD-10-CM | POA: Diagnosis not present

## 2012-02-26 DIAGNOSIS — I509 Heart failure, unspecified: Secondary | ICD-10-CM | POA: Diagnosis present

## 2012-02-26 DIAGNOSIS — R4182 Altered mental status, unspecified: Secondary | ICD-10-CM | POA: Diagnosis not present

## 2012-02-26 DIAGNOSIS — E785 Hyperlipidemia, unspecified: Secondary | ICD-10-CM | POA: Diagnosis not present

## 2012-02-26 DIAGNOSIS — K449 Diaphragmatic hernia without obstruction or gangrene: Secondary | ICD-10-CM | POA: Diagnosis not present

## 2012-02-26 DIAGNOSIS — I5031 Acute diastolic (congestive) heart failure: Principal | ICD-10-CM | POA: Diagnosis present

## 2012-02-26 DIAGNOSIS — R918 Other nonspecific abnormal finding of lung field: Secondary | ICD-10-CM | POA: Diagnosis not present

## 2012-02-26 DIAGNOSIS — R110 Nausea: Secondary | ICD-10-CM | POA: Diagnosis not present

## 2012-02-26 DIAGNOSIS — R42 Dizziness and giddiness: Secondary | ICD-10-CM | POA: Diagnosis not present

## 2012-02-26 DIAGNOSIS — R079 Chest pain, unspecified: Secondary | ICD-10-CM | POA: Diagnosis not present

## 2012-02-26 HISTORY — DX: Other complications of anesthesia, initial encounter: T88.59XA

## 2012-02-26 HISTORY — DX: Primary osteoarthritis, unspecified shoulder: M19.019

## 2012-02-26 HISTORY — DX: Unspecified hearing loss, unspecified ear: H91.90

## 2012-02-26 HISTORY — DX: Adverse effect of unspecified anesthetic, initial encounter: T41.45XA

## 2012-02-26 HISTORY — DX: Chronic kidney disease, unspecified: N18.9

## 2012-02-26 LAB — PROTIME-INR
INR: 1.04 (ref 0.00–1.49)
Prothrombin Time: 13.8 seconds (ref 11.6–15.2)

## 2012-02-26 LAB — CBC WITH DIFFERENTIAL/PLATELET
Basophils Absolute: 0 10*3/uL (ref 0.0–0.1)
Eosinophils Absolute: 0.1 10*3/uL (ref 0.0–0.7)
Eosinophils Relative: 1 % (ref 0–5)
Lymphs Abs: 1.1 10*3/uL (ref 0.7–4.0)
MCH: 30 pg (ref 26.0–34.0)
Neutrophils Relative %: 78 % — ABNORMAL HIGH (ref 43–77)
Platelets: 155 10*3/uL (ref 150–400)
RBC: 3.97 MIL/uL (ref 3.87–5.11)
RDW: 14.5 % (ref 11.5–15.5)
WBC: 8 10*3/uL (ref 4.0–10.5)

## 2012-02-26 LAB — COMPREHENSIVE METABOLIC PANEL
ALT: 14 U/L (ref 0–35)
AST: 20 U/L (ref 0–37)
Albumin: 3.4 g/dL — ABNORMAL LOW (ref 3.5–5.2)
Alkaline Phosphatase: 80 U/L (ref 39–117)
Calcium: 9.8 mg/dL (ref 8.4–10.5)
GFR calc Af Amer: 84 mL/min — ABNORMAL LOW (ref >90)
Glucose, Bld: 122 mg/dL — ABNORMAL HIGH (ref 70–99)
Potassium: 4.4 mEq/L (ref 3.5–5.1)
Sodium: 142 mEq/L (ref 135–145)
Total Protein: 6.8 g/dL (ref 6.0–8.3)

## 2012-02-26 LAB — URINALYSIS, ROUTINE W REFLEX MICROSCOPIC
Glucose, UA: NEGATIVE mg/dL
Hgb urine dipstick: NEGATIVE
Specific Gravity, Urine: 1.005 (ref 1.005–1.030)
pH: 7.5 (ref 5.0–8.0)

## 2012-02-26 LAB — URINE MICROSCOPIC-ADD ON

## 2012-02-26 LAB — CARDIAC PANEL(CRET KIN+CKTOT+MB+TROPI)
Relative Index: 2.1 (ref 0.0–2.5)
Troponin I: <0.3 ng/mL (ref <0.30)

## 2012-02-26 MED ORDER — ONDANSETRON HCL 4 MG/2ML IJ SOLN
4.0000 mg | Freq: Four times a day (QID) | INTRAMUSCULAR | Status: DC | PRN
  Filled 2012-02-26: qty 2

## 2012-02-26 MED ORDER — SODIUM CHLORIDE 0.9 % IJ SOLN: 3.0000 mL | INTRAMUSCULAR | Status: DC | PRN

## 2012-02-26 MED ORDER — FUROSEMIDE 10 MG/ML IJ SOLN
40.0000 mg | Freq: Once | INTRAMUSCULAR | Status: AC
  Administered 2012-02-26: 40 mg via INTRAVENOUS
  Filled 2012-02-26: qty 4

## 2012-02-26 MED ORDER — DEXTROSE 5 % IV SOLN
1.0000 g | INTRAVENOUS | Status: DC
  Administered 2012-02-26 – 2012-02-27 (×2): 1 g via INTRAVENOUS
  Filled 2012-02-26 (×3): qty 10

## 2012-02-26 MED ORDER — DEXTROSE 5 % IV SOLN
500.0000 mg | Freq: Once | INTRAVENOUS | Status: AC
  Administered 2012-02-26: 500 mg via INTRAVENOUS
  Filled 2012-02-26: qty 500

## 2012-02-26 MED ORDER — SODIUM CHLORIDE 0.9 % IV SOLN: 250.0000 mL | INTRAVENOUS | Status: DC | PRN

## 2012-02-26 MED ORDER — ONDANSETRON HCL 4 MG PO TABS
4.0000 mg | ORAL_TABLET | Freq: Four times a day (QID) | ORAL | Status: DC | PRN
  Filled 2012-02-26: qty 1

## 2012-02-26 MED ORDER — ASPIRIN EC 81 MG PO TBEC
81.0000 mg | DELAYED_RELEASE_TABLET | Freq: Every day | ORAL | Status: DC
  Administered 2012-02-26 – 2012-02-29 (×4): 81 mg via ORAL
  Filled 2012-02-26 (×4): qty 1

## 2012-02-26 MED ORDER — LINAGLIPTIN 5 MG PO TABS
5.0000 mg | ORAL_TABLET | Freq: Every day | ORAL | Status: DC
  Administered 2012-02-27 – 2012-02-29 (×3): 5 mg via ORAL
  Filled 2012-02-26 (×3): qty 1

## 2012-02-26 MED ORDER — BENAZEPRIL HCL 20 MG PO TABS
20.0000 mg | ORAL_TABLET | Freq: Every day | ORAL | Status: DC
  Administered 2012-02-27 – 2012-02-29 (×3): 20 mg via ORAL
  Filled 2012-02-26 (×3): qty 1

## 2012-02-26 MED ORDER — ALUM & MAG HYDROXIDE-SIMETH 200-200-20 MG/5ML PO SUSP
30.0000 mL | Freq: Four times a day (QID) | ORAL | Status: DC | PRN
  Filled 2012-02-26: qty 30

## 2012-02-26 MED ORDER — SODIUM CHLORIDE 0.9 % IJ SOLN
3.0000 mL | Freq: Two times a day (BID) | INTRAMUSCULAR | Status: DC
  Administered 2012-02-26 – 2012-02-29 (×4): 3 mL via INTRAVENOUS

## 2012-02-26 MED ORDER — MAGNESIUM OXIDE 400 (241.3 MG) MG PO TABS
400.0000 mg | ORAL_TABLET | Freq: Every day | ORAL | Status: DC
  Administered 2012-02-27 – 2012-02-29 (×4): 400 mg via ORAL
  Filled 2012-02-26 (×5): qty 1

## 2012-02-26 MED ORDER — VITAMIN B-12 500 MCG PO TABS: 500.0000 ug | ORAL_TABLET | Freq: Every day | ORAL | Status: DC

## 2012-02-26 MED ORDER — AMLODIPINE BESY-BENAZEPRIL HCL 5-20 MG PO CAPS: 1.0000 | ORAL_CAPSULE | Freq: Every day | ORAL | Status: DC

## 2012-02-26 MED ORDER — DOCUSATE SODIUM 100 MG PO CAPS
100.0000 mg | ORAL_CAPSULE | Freq: Two times a day (BID) | ORAL | Status: DC
  Administered 2012-02-26 – 2012-02-28 (×5): 100 mg via ORAL
  Filled 2012-02-26 (×7): qty 1

## 2012-02-26 MED ORDER — GLIMEPIRIDE 1 MG PO TABS
1.0000 mg | ORAL_TABLET | Freq: Every day | ORAL | Status: DC
  Administered 2012-02-27 – 2012-02-29 (×3): 1 mg via ORAL
  Filled 2012-02-26 (×5): qty 1

## 2012-02-26 MED ORDER — CYANOCOBALAMIN 500 MCG PO TABS
500.0000 ug | ORAL_TABLET | Freq: Every day | ORAL | Status: DC
  Administered 2012-02-27 – 2012-02-29 (×3): 500 ug via ORAL
  Filled 2012-02-26 (×3): qty 1

## 2012-02-26 MED ORDER — IRON 240 (27 FE) MG PO TABS: 1.0000 | ORAL_TABLET | Freq: Every day | ORAL | Status: DC

## 2012-02-26 MED ORDER — LABETALOL HCL 200 MG PO TABS
200.0000 mg | ORAL_TABLET | Freq: Two times a day (BID) | ORAL | Status: DC
  Administered 2012-02-27 – 2012-02-29 (×6): 200 mg via ORAL
  Filled 2012-02-26 (×8): qty 1

## 2012-02-26 MED ORDER — MAGNESIUM 250 MG PO TABS: 1.0000 | ORAL_TABLET | Freq: Every day | ORAL | Status: DC

## 2012-02-26 MED ORDER — DARIFENACIN HYDROBROMIDE ER 7.5 MG PO TB24
7.5000 mg | ORAL_TABLET | Freq: Every day | ORAL | Status: DC
  Administered 2012-02-27 – 2012-02-29 (×3): 7.5 mg via ORAL
  Filled 2012-02-26 (×3): qty 1

## 2012-02-26 MED ORDER — AMLODIPINE BESYLATE 5 MG PO TABS
5.0000 mg | ORAL_TABLET | Freq: Every day | ORAL | Status: DC
  Administered 2012-02-27 – 2012-02-29 (×3): 5 mg via ORAL
  Filled 2012-02-26 (×3): qty 1

## 2012-02-26 MED ORDER — SODIUM CHLORIDE 0.9 % IV BOLUS (SEPSIS)
500.0000 mL | Freq: Once | INTRAVENOUS | Status: AC
  Administered 2012-02-26: 500 mL via INTRAVENOUS

## 2012-02-26 MED ORDER — HYDROCODONE-ACETAMINOPHEN 5-325 MG PO TABS: 1.0000 | ORAL_TABLET | ORAL | Status: DC | PRN

## 2012-02-26 MED ORDER — SODIUM CHLORIDE 0.9 % IJ SOLN
3.0000 mL | Freq: Two times a day (BID) | INTRAMUSCULAR | Status: DC
  Administered 2012-02-27 – 2012-02-28 (×2): 3 mL via INTRAVENOUS

## 2012-02-26 MED ORDER — SIMVASTATIN 10 MG PO TABS
10.0000 mg | ORAL_TABLET | Freq: Every day | ORAL | Status: DC
  Administered 2012-02-27 – 2012-02-29 (×3): 10 mg via ORAL
  Filled 2012-02-26 (×3): qty 1

## 2012-02-26 MED ORDER — HEPARIN SODIUM (PORCINE) 5000 UNIT/ML IJ SOLN
5000.0000 [IU] | Freq: Three times a day (TID) | INTRAMUSCULAR | Status: DC
  Administered 2012-02-26 – 2012-02-29 (×9): 5000 [IU] via SUBCUTANEOUS
  Filled 2012-02-26 (×11): qty 1

## 2012-02-26 MED ORDER — FERROUS GLUCONATE 324 (38 FE) MG PO TABS
324.0000 mg | ORAL_TABLET | Freq: Every day | ORAL | Status: DC
  Administered 2012-02-27 – 2012-02-29 (×3): 324 mg via ORAL
  Filled 2012-02-26 (×6): qty 1

## 2012-02-26 MED ORDER — GI COCKTAIL ~~LOC~~
30.0000 mL | Freq: Once | ORAL | Status: AC
  Administered 2012-02-26: 30 mL via ORAL
  Filled 2012-02-26: qty 30

## 2012-02-26 NOTE — H&P (Signed)
Heidi Dominguez is an 78 y.o. female.   Chief Complaint: Syncopal episode and cough HPI: 78 years old female with diabetes, hypertension had mild epigastric and chest pain. The pain is described as burning. There is radiation to the neck and mild nausea. These symptoms persisted for several moments, the patient had a syncopal events. She denies any head trauma, any complaints upon awakening, and on arrival the patient has no chest pain or any focal complaints. She also had cough last night lasting for many minutes.   Past Medical History  Diagnosis Date  . Diabetes mellitus   . Hypertension   . High cholesterol   . Osteoarthritis   . Degenerative joint disease of shoulder region   . HOH (hard of hearing)   . H/O knee surgery   . H/O shoulder surgery       Past Surgical History  Procedure Date  . Shoulder surgery   . Knee surgery   . Back surgery     No family history on file. Social History:  reports that she has never smoked. She has never used smokeless tobacco. She reports that she does not drink alcohol or use illicit drugs.  Allergies:  Allergies  Allergen Reactions  . Catapres (Clonidine Hcl) Other (See Comments)    Unknown.  . Covera-Hs (Verapamil Hcl) Other (See Comments)    Unknown.  . Other Other (See Comments)    Any type of narcotics Feels "loopy"  . Sulfa Antibiotics Itching     (Not in a hospital admission)  Results for orders placed during the hospital encounter of 02/26/12 (from the past 48 hour(s))  CBC WITH DIFFERENTIAL     Status: Abnormal   Collection Time   02/26/12  3:17 PM      Component Value Range Comment   WBC 8.0  4.0 - 10.5 K/uL    RBC 3.97  3.87 - 5.11 MIL/uL    Hemoglobin 11.9 (*) 12.0 - 15.0 g/dL    HCT 16.1  09.6 - 04.5 %    MCV 91.9  78.0 - 100.0 fL    MCH 30.0  26.0 - 34.0 pg    MCHC 32.6  30.0 - 36.0 g/dL    RDW 40.9  81.1 - 91.4 %    Platelets 155  150 - 400 K/uL    Neutrophils Relative 78 (*) 43 - 77 %    Neutro Abs 6.2   1.7 - 7.7 K/uL    Lymphocytes Relative 13  12 - 46 %    Lymphs Abs 1.1  0.7 - 4.0 K/uL    Monocytes Relative 8  3 - 12 %    Monocytes Absolute 0.6  0.1 - 1.0 K/uL    Eosinophils Relative 1  0 - 5 %    Eosinophils Absolute 0.1  0.0 - 0.7 K/uL    Basophils Relative 0  0 - 1 %    Basophils Absolute 0.0  0.0 - 0.1 K/uL   COMPREHENSIVE METABOLIC PANEL     Status: Abnormal   Collection Time   02/26/12  3:17 PM      Component Value Range Comment   Sodium 142  135 - 145 mEq/L    Potassium 4.4  3.5 - 5.1 mEq/L    Chloride 105  96 - 112 mEq/L    CO2 28  19 - 32 mEq/L    Glucose, Bld 122 (*) 70 - 99 mg/dL    BUN 19  6 - 23 mg/dL  Creatinine, Ser 0.77  0.50 - 1.10 mg/dL    Calcium 9.8  8.4 - 16.1 mg/dL    Total Protein 6.8  6.0 - 8.3 g/dL    Albumin 3.4 (*) 3.5 - 5.2 g/dL    AST 20  0 - 37 U/L    ALT 14  0 - 35 U/L    Alkaline Phosphatase 80  39 - 117 U/L    Total Bilirubin 0.2 (*) 0.3 - 1.2 mg/dL    GFR calc non Af Amer 72 (*) >90 mL/min    GFR calc Af Amer 84 (*) >90 mL/min   LIPASE, BLOOD     Status: Normal   Collection Time   02/26/12  3:17 PM      Component Value Range Comment   Lipase 29  11 - 59 U/L   TROPONIN I     Status: Normal   Collection Time   02/26/12  3:17 PM      Component Value Range Comment   Troponin I <0.30  <0.30 ng/mL   PROTIME-INR     Status: Normal   Collection Time   02/26/12  3:17 PM      Component Value Range Comment   Prothrombin Time 13.8  11.6 - 15.2 seconds    INR 1.04  0.00 - 1.49    Dg Chest 2 View  02/26/2012  *RADIOLOGY REPORT*  Clinical Data: Chest discomfort, shortness of breath  CHEST - 2 VIEW  Comparison: Chest x-ray of 09/24/2011  Findings: There is parenchymal opacity in the left mid lung and at the lung base on the lateral view suspicious for foci of pneumonia. No pleural effusion is seen.  Moderate cardiomegaly is stable.  A moderate sized hiatal hernia is noted.  The bones are osteopenic and bilateral humeral head replacements are noted.   IMPRESSION:  1.  Patchy opacity in the left midlung and at the posterior lung base on the lateral view suspicious for pneumonia. 2.  Stable moderate cardiomegaly  Original Report Authenticated By: Juline Patch, M.D.   Ct Head Wo Contrast  02/26/2012  *RADIOLOGY REPORT*  Clinical Data: Altered mental status, fainting spell  CT HEAD WITHOUT CONTRAST  Technique:  Contiguous axial images were obtained from the base of the skull through the vertex without contrast.  Comparison: Head CT 02/04/2009  Findings: No acute intracranial hemorrhage.  No focal mass lesion. No CT evidence of acute infarction.   No midline shift or mass effect.  No hydrocephalus.  Basilar cisterns are patent.  There is generalized cortical atrophy unchanged from prior.  There is periventricular and subcortical white matter hypodensities also unchanged.  Paranasal sinuses and mastoid air cells are clear.  Orbits are normal.  IMPRESSION:  1.  No acute intracranial findings.  2.   No significant change in atrophy and microvascular disease.  Original Report Authenticated By: Genevive Bi, M.D.    @ROS @ Constitutional:  No weight loss, night sweats, Fevers, chills, fatigue.  HEENT: No headaches, Difficulty swallowing, nasal congestion, post nasal drip,  Cardio-vascular: + chest pain, No Orthopnea, PND, swelling in lower extremities, anasarca, dizziness, palpitations  GI: No heartburn, indigestion, abdominal pain, nausea, vomiting, diarrhea, change in bowel habits, loss of appetite  Resp: No shortness of breath with exertion or at rest. No excess mucus, + productive cough, + non-productive cough, No coughing up of blood.No change in color of mucus.No wheezing.No chest wall deformity  Skin: no rash or lesions.  GU: No dysuria, change in color of urine,  no urgency or frequency. No flank pain.  Musculoskeletal: + joint pain or swelling. + decreased range of motion.  Psych: No change in mood or affect. No depression or  anxiety.   Physical exam: Blood pressure 151/56, pulse 68, temperature 98.3 F (36.8 C), temperature source Oral, resp. rate 20, SpO2 97.00%. HEENT: [place]/AT, Eyes-Brown, PERL, EOMI, Conjunctiva-Pink, Sclera-Non-icteric. Wears glasses Neck: + JVD, No bruit, Trachea midline.  Lungs: Clear, Bilateral.  Cardiac: Regular rhythm, normal S1 and S2, no S3. III/VI systolic murmur right and left sternal borders.  Abdomen: Soft, non-tender.  Extremities: No edema present. No cyanosis. No clubbing.  CNS: AxOx3, Cranial nerves grossly intact, moves all 4 extremities. Right handed.  Skin: Warm and dry.  Assessment/Plan Syncope Possible pneumonia Possible acute left heart systolic failure DM, II HTN Arthritis  R/O MI IV antibiotic Telemetry Home meds  Alante Weimann S 02/26/2012, 5:04 PM

## 2012-02-26 NOTE — ED Notes (Signed)
PT family reports Pt fainted in BR today . EMS was called after the family helped PT to chair. Family reported giving PT ASA ? Dose.

## 2012-02-26 NOTE — ED Provider Notes (Signed)
History     CSN: [id number]  Arrival date & time 02/26/12  1438   First MD Initiated Contact with Patient 02/26/12 1458      No chief complaint on file.   (Consider location/radiation/quality/duration/timing/severity/associated sxs/prior treatment) HPI The patient presents after a syncopal episode.  She notes that approximately 3 hours ago she will to mild epigastric and chest pain.  The pain is described as burning.  There is radiation to the neck and mild nausea.  These symptoms persisted for several moments, the patient had a syncopal events.  She denies any head trauma, any complaints upon awakening, and on arrival the patient has no chest pain or any focal complaints. She notes that she has been in her usual state of health, compliant with all medication.  She was evaluated by her cardiologist one week ago. Past Medical History  Diagnosis Date  . Diabetes mellitus   . Hypertension   . High cholesterol   . Osteoarthritis   . Degenerative joint disease of shoulder region   . HOH (hard of hearing)   . H/O knee surgery   . H/O shoulder surgery     Past Surgical History  Procedure Date  . Shoulder surgery   . Knee surgery   . Back surgery     No family history on file.  History  Substance Use Topics  . Smoking status: Never Smoker   . Smokeless tobacco: Never Used  . Alcohol Use: No    OB History    Grav Para Term Preterm Abortions TAB SAB Ect Mult Living                  Review of Systems  Constitutional:       HPI  HENT:       HPI otherwise negative  Eyes: Negative.   Respiratory:       HPI, otherwise negative  Cardiovascular:       HPI, otherwise nmegative  Gastrointestinal: Negative for vomiting.  Genitourinary:       HPI, otherwise negative  Musculoskeletal:       HPI, otherwise negative  Skin: Negative.   Neurological: Negative for syncope.    Allergies  Catapres; Covera-hs; Other; and Sulfa antibiotics  Home Medications   Current  Outpatient Rx  Name Route Sig Dispense Refill  . AMLODIPINE BESY-BENAZEPRIL HCL 5-20 MG PO CAPS Oral Take 1 capsule by mouth daily.    . B COMPLEX-C PO TABS Oral Take 1-2 tablets by mouth daily.    Marland Kitchen VITAMIN D3 5000 UNITS PO CAPS Oral Take 1 capsule by mouth daily.    . IRON 240 (27 FE) MG PO TABS Oral Take 1 tablet by mouth daily.    Marland Kitchen GLIMEPIRIDE 1 MG PO TABS Oral Take 1 mg by mouth daily with breakfast.    . ISOSORB DINITRATE-HYDRALAZINE 20-37.5 MG PO TABS Oral Take 1 tablet by mouth 2 (two) times daily. 60 tablet 0  . MAGNESIUM 250 MG PO TABS Oral Take 1 tablet by mouth daily.    Marland Kitchen METHOCARBAMOL 500 MG PO TABS Oral Take 500 mg by mouth 3 (three) times daily.    Marland Kitchen METOPROLOL SUCCINATE ER 25 MG PO TB24 Oral Take 1 tablet (25 mg total) by mouth daily. 30 tablet 0  . PRAVASTATIN SODIUM 40 MG PO TABS Oral Take 40 mg by mouth daily.    Marland Kitchen SITAGLIPTIN PHOSPHATE 100 MG PO TABS Oral Take 100 mg by mouth daily.    Marland Kitchen VITAMIN B-12  500 MCG PO TABS Oral Take 500 mcg by mouth daily.      BP 151/56  Pulse 68  Temp 98.3 F (36.8 C) (Oral)  Resp 20  SpO2 97%  Physical Exam  Nursing note and vitals reviewed. Constitutional: She is oriented to person, place, and time. She appears well-developed and well-nourished. No distress.  HENT:  Head: Normocephalic and atraumatic.  Eyes: Conjunctivae and EOM are normal.  Cardiovascular: Normal rate and regular rhythm.   Murmur heard. Pulmonary/Chest: Effort normal and breath sounds normal. No stridor. No respiratory distress.  Abdominal: She exhibits no distension.  Musculoskeletal: She exhibits no edema.  Neurological: She is alert and oriented to person, place, and time. No cranial nerve deficit. She exhibits normal muscle tone. Coordination normal.  Skin: Skin is warm and dry.  Psychiatric: She has a normal mood and affect.    ED Course  Procedures (including critical care time)   Labs Reviewed  CBC WITH DIFFERENTIAL  COMPREHENSIVE METABOLIC  PANEL  LIPASE, BLOOD  TROPONIN I  URINALYSIS, ROUTINE W REFLEX MICROSCOPIC  PROTIME-INR   No results found.   No diagnosis found.   Cardiac 67 sinus rhythm normal Pulse oximetry 97% room air normal   Date: 02/26/2012  Rate: 67  Rhythm: normal sinus rhythm  QRS Axis: normal  Intervals: PR prolonged  ST/T Wave abnormalities: nonspecific T wave changes  Conduction Disutrbances:first-degree A-V block   Narrative Interpretation:   Old EKG Reviewed: unchanged ABNORMAL  MDM  This 78 year old female presents following a syncopal events.  Notably, the patient had nausea and mild chest pain prior to the event.  On exam the patient is in no distress with unremarkable vital signs.  Given the patient's history of hypertension and heart disease this was an immediate consideration, though she was seen by her cardiologist one week ago and reports that evaluation has been reassuring.  The patient's evaluation is most notable for evidence of a left sided pneumonia.  She was admitted for further evaluation and management after receiving antibiotics in the emergency department.   Gerhard Munch, MD 02/26/12 [phone]

## 2012-02-27 ENCOUNTER — Encounter (HOSPITAL_COMMUNITY): Payer: Self-pay | Admitting: General Practice

## 2012-02-27 DIAGNOSIS — I5031 Acute diastolic (congestive) heart failure: Secondary | ICD-10-CM | POA: Diagnosis not present

## 2012-02-27 DIAGNOSIS — I5021 Acute systolic (congestive) heart failure: Secondary | ICD-10-CM | POA: Diagnosis not present

## 2012-02-27 DIAGNOSIS — R55 Syncope and collapse: Secondary | ICD-10-CM | POA: Diagnosis not present

## 2012-02-27 DIAGNOSIS — E119 Type 2 diabetes mellitus without complications: Secondary | ICD-10-CM | POA: Diagnosis not present

## 2012-02-27 DIAGNOSIS — I10 Essential (primary) hypertension: Secondary | ICD-10-CM | POA: Diagnosis not present

## 2012-02-27 DIAGNOSIS — R42 Dizziness and giddiness: Secondary | ICD-10-CM | POA: Diagnosis not present

## 2012-02-27 DIAGNOSIS — E785 Hyperlipidemia, unspecified: Secondary | ICD-10-CM | POA: Diagnosis not present

## 2012-02-27 LAB — CBC
HCT: 34.7 % — ABNORMAL LOW (ref 36.0–46.0)
Hemoglobin: 11.1 g/dL — ABNORMAL LOW (ref 12.0–15.0)
MCH: 29.1 pg (ref 26.0–34.0)
MCV: 90.8 fL (ref 78.0–100.0)
RBC: 3.82 MIL/uL — ABNORMAL LOW (ref 3.87–5.11)
WBC: 6.4 10*3/uL (ref 4.0–10.5)

## 2012-02-27 LAB — BASIC METABOLIC PANEL
CO2: 30 mEq/L (ref 19–32)
Calcium: 9.3 mg/dL (ref 8.4–10.5)
Chloride: 107 mEq/L (ref 96–112)
Glucose, Bld: 85 mg/dL (ref 70–99)
Potassium: 3.5 mEq/L (ref 3.5–5.1)
Sodium: 145 mEq/L (ref 135–145)

## 2012-02-27 LAB — CARDIAC PANEL(CRET KIN+CKTOT+MB+TROPI)
Relative Index: 1.7 (ref 0.0–2.5)
Total CK: 102 U/L (ref 7–177)
Troponin I: <0.3 ng/mL (ref <0.30)

## 2012-02-27 LAB — GLUCOSE, CAPILLARY: Glucose-Capillary: 91 mg/dL (ref 70–99)

## 2012-02-27 MED ORDER — POLYVINYL ALCOHOL 1.4 % OP SOLN
1.0000 [drp] | OPHTHALMIC | Status: DC | PRN
  Administered 2012-02-28: 1 [drp] via OPHTHALMIC
  Filled 2012-02-27: qty 15

## 2012-02-27 MED ORDER — POTASSIUM CHLORIDE CRYS ER 20 MEQ PO TBCR
20.0000 meq | EXTENDED_RELEASE_TABLET | Freq: Two times a day (BID) | ORAL | Status: AC
  Administered 2012-02-27 – 2012-02-28 (×4): 20 meq via ORAL
  Filled 2012-02-27 (×4): qty 1

## 2012-02-27 MED ORDER — DEXTROSE 5 % IV SOLN
500.0000 mg | Freq: Once | INTRAVENOUS | Status: AC
  Administered 2012-02-27: 500 mg via INTRAVENOUS
  Filled 2012-02-27: qty 500

## 2012-02-27 MED ORDER — ACETAMINOPHEN 325 MG PO TABS
325.0000 mg | ORAL_TABLET | Freq: Four times a day (QID) | ORAL | Status: DC | PRN
  Administered 2012-02-27: 650 mg via ORAL
  Filled 2012-02-27 (×2): qty 2

## 2012-02-27 MED ORDER — FUROSEMIDE 10 MG/ML IJ SOLN
40.0000 mg | Freq: Once | INTRAMUSCULAR | Status: AC
  Administered 2012-02-27: 40 mg via INTRAVENOUS
  Filled 2012-02-27: qty 4

## 2012-02-27 NOTE — Clinical Documentation Improvement (Signed)
GENERIC DOCUMENTATION CLARIFICATION QUERY  THIS DOCUMENT IS NOT A PERMANENT PART OF THE MEDICAL RECORD  TO RESPOND TO THE THIS QUERY, FOLLOW THE INSTRUCTIONS BELOW:  1. If needed, update documentation for the patient's encounter via the notes activity.  2. Access this query again and click edit on the In [employer].  3. After updating, or not, click F2 to complete all highlighted (required) fields concerning your review. Select "additional documentation in the medical record" OR "no additional documentation provided".  4. Click Sign note button.  5. The deficiency will fall out of your In Basket *Please let us know if you are not able to complete this workflow by phone or e-mail (listed below).  Please update your documentation within the medical record to reflect your response to this query.                                                                                        02/27/12   Dear Dr .Algie Coffer  / Associates,  In a better effort to capture your patient's severity of illness, reflect appropriate length of stay and utilization of resources, a review of the patient medical record has revealed the following indicators.  THANKS FOR CLARIFYING A POSSIBLE/SUSPECTED DIAGNOSIS.  Possible Clinical Conditions? - Please clarify if you suspect the symptom of  "Syncope" could be linked to a diagnosis POA.  Supporting Information: - Risk Factors: POA per H&P are "Possible Pneumonia" and "Possible Acute Left Heart Failure". - Signs & Symptoms: Cough, syncope - Treatment:  Rocephin IV, Zithromax IV, Lasix 40mg  IV, tele, r/o MI, O2, NS IV bolus   You may use possible, probable, or suspect with inpatient documentation. possible, probable, suspected diagnoses MUST be documented at the time of discharge  Reviewed: additional documentation in the medical record  Thank You,  Beverley Fiedler RN Clinical Documentation Specialist: Pager:  [phone] Health Information Management:   [phone] [facility]

## 2012-02-27 NOTE — Progress Notes (Signed)
Subjective:  No chest pain or cough. Breathing better. Cardiac cath offered, wants medical treatment only.  Objective:  Vital Signs in the last 24 hours: Temp:  [98.3 F (36.8 C)-98.4 F (36.9 C)] 98.3 F (36.8 C) (07/16 0500) Pulse Rate:  [66-81] 79  (07/16 0500) Cardiac Rhythm:  [-] Normal sinus rhythm (07/15 2218) Resp:  [18-20] 18  (07/16 0500) BP: (140-158)/(54-71) 155/62 mmHg (07/16 0500) SpO2:  [93 %-97 %] 96 % (07/16 0500) Weight:  [81.3 kg (179 lb 3.7 oz)-83.2 kg (183 lb 6.8 oz)] 81.3 kg (179 lb 3.7 oz) (07/16 0500)  Physical Exam: BP Readings from Last 1 Encounters:  02/27/12 155/62    Wt Readings from Last 1 Encounters:  02/27/12 81.3 kg (179 lb 3.7 oz)    Weight change:   HEENT: [place]/AT, Eyes-Brown, PERL, EOMI, Conjunctiva-Pink, Sclera-Non-icteric Neck: No JVD, No bruit, Trachea midline. Lungs:  Clear, Bilateral. Cardiac:  Regular rhythm, normal S1 and S2, no S3.  Abdomen:  Soft, non-tender. Extremities:  No edema present. No cyanosis. No clubbing. CNS: AxOx3, Cranial nerves grossly intact, moves all 4 extremities. Right handed. Skin: Warm and dry.   Intake/Output from previous day:      Lab Results: BMET    Component Value Date/Time   NA 145 02/27/2012 0504   K 3.5 02/27/2012 0504   CL 107 02/27/2012 0504   CO2 30 02/27/2012 0504   GLUCOSE 85 02/27/2012 0504   BUN 14 02/27/2012 0504   CREATININE 0.73 02/27/2012 0504   CALCIUM 9.3 02/27/2012 0504   GFRNONAA 74* 02/27/2012 0504   GFRAA 85* 02/27/2012 0504   CBC    Component Value Date/Time   WBC 6.4 02/27/2012 0504   RBC 3.82* 02/27/2012 0504   HGB 11.1* 02/27/2012 0504   HCT 34.7* 02/27/2012 0504   PLT 156 02/27/2012 0504   MCV 90.8 02/27/2012 0504   MCH 29.1 02/27/2012 0504   MCHC 32.0 02/27/2012 0504   RDW 14.6 02/27/2012 0504   LYMPHSABS 1.1 02/26/2012 1517   MONOABS 0.6 02/26/2012 1517   EOSABS 0.1 02/26/2012 1517   BASOSABS 0.0 02/26/2012 1517   CARDIAC ENZYMES Lab Results  Component Value Date   CKTOTAL 102 02/27/2012   CKMB 1.7 02/27/2012   TROPONINI <0.30 02/27/2012    Assessment/Plan:  Patient Active Hospital Problem List: Syncope  Possible pneumonia  Possible acute left heart systolic failure  DM, II  HTN  Arthritis  Continue medications.   LOS: 1 day    Orpah Cobb  MD  02/27/2012, 9:09 AM

## 2012-02-28 ENCOUNTER — Inpatient Hospital Stay (HOSPITAL_COMMUNITY): Payer: Medicare Other

## 2012-02-28 DIAGNOSIS — E119 Type 2 diabetes mellitus without complications: Secondary | ICD-10-CM | POA: Diagnosis not present

## 2012-02-28 DIAGNOSIS — E785 Hyperlipidemia, unspecified: Secondary | ICD-10-CM | POA: Diagnosis not present

## 2012-02-28 DIAGNOSIS — I5021 Acute systolic (congestive) heart failure: Secondary | ICD-10-CM | POA: Diagnosis not present

## 2012-02-28 DIAGNOSIS — K449 Diaphragmatic hernia without obstruction or gangrene: Secondary | ICD-10-CM | POA: Diagnosis not present

## 2012-02-28 DIAGNOSIS — R42 Dizziness and giddiness: Secondary | ICD-10-CM | POA: Diagnosis not present

## 2012-02-28 LAB — CBC
HCT: 37.6 % (ref 36.0–46.0)
MCV: 91.5 fL (ref 78.0–100.0)
Platelets: 163 10*3/uL (ref 150–400)
RBC: 4.11 MIL/uL (ref 3.87–5.11)
RDW: 14.8 % (ref 11.5–15.5)
WBC: 6.9 10*3/uL (ref 4.0–10.5)

## 2012-02-28 LAB — BASIC METABOLIC PANEL
CO2: 29 mEq/L (ref 19–32)
Calcium: 9.5 mg/dL (ref 8.4–10.5)
Chloride: 103 mEq/L (ref 96–112)
Creatinine, Ser: 0.78 mg/dL (ref 0.50–1.10)
GFR calc Af Amer: 83 mL/min — ABNORMAL LOW (ref >90)
Sodium: 143 mEq/L (ref 135–145)

## 2012-02-28 LAB — GLUCOSE, CAPILLARY

## 2012-02-28 NOTE — Progress Notes (Signed)
UR Completed Kenyia Wambolt Graves-Bigelow, RN,BSN [phone]  

## 2012-02-28 NOTE — Progress Notes (Signed)
Subjective:  Feeling better. Lung infiltrate resolved with lasix use.  Objective:  Vital Signs in the last 24 hours: Temp:  [98.1 F (36.7 C)-99.1 F (37.3 C)] 98.6 F (37 C) (07/17 0500) Pulse Rate:  [66-75] 66  (07/17 0955) Cardiac Rhythm:  [-] Normal sinus rhythm (07/17 0832) Resp:  [18] 18  (07/17 0500) BP: (134-160)/(64-70) 160/70 mmHg (07/17 0955) SpO2:  [91 %-97 %] 95 % (07/17 0500)  Physical Exam: BP Readings from Last 1 Encounters:  02/28/12 160/70    Wt Readings from Last 1 Encounters:  02/27/12 81.3 kg (179 lb 3.7 oz)    Weight change:   HEENT: [place]/AT, Eyes-Brown, PERL, EOMI, Conjunctiva-Pink, Sclera-Non-icteric Neck: No JVD, No bruit, Trachea midline. Lungs:  Clear, Bilateral. Cardiac:  Regular rhythm, normal S1 and S2, no S3.  Abdomen:  Soft, non-tender. Extremities:  No edema present. No cyanosis. No clubbing. CNS: AxOx3, Cranial nerves grossly intact, moves all 4 extremities. Right handed. Skin: Warm and dry.   Intake/Output from previous day: 07/16 0701 - 07/17 0700 In: 363 [P.O.:360; I.V.:3] Out: 100 [Urine:100]    Lab Results: BMET    Component Value Date/Time   NA 143 02/28/2012 0504   K 3.7 02/28/2012 0504   CL 103 02/28/2012 0504   CO2 29 02/28/2012 0504   GLUCOSE 84 02/28/2012 0504   BUN 15 02/28/2012 0504   CREATININE 0.78 02/28/2012 0504   CALCIUM 9.5 02/28/2012 0504   GFRNONAA 72* 02/28/2012 0504   GFRAA 83* 02/28/2012 0504   CBC    Component Value Date/Time   WBC 6.9 02/28/2012 0504   RBC 4.11 02/28/2012 0504   HGB 11.9* 02/28/2012 0504   HCT 37.6 02/28/2012 0504   PLT 163 02/28/2012 0504   MCV 91.5 02/28/2012 0504   MCH 29.0 02/28/2012 0504   MCHC 31.6 02/28/2012 0504   RDW 14.8 02/28/2012 0504   LYMPHSABS 1.1 02/26/2012 1517   MONOABS 0.6 02/26/2012 1517   EOSABS 0.1 02/26/2012 1517   BASOSABS 0.0 02/26/2012 1517   CARDIAC ENZYMES Lab Results  Component Value Date   CKTOTAL 101 02/27/2012   CKMB 1.5 02/27/2012   TROPONINI <0.30 02/27/2012     Assessment/Plan:  Patient Active Hospital Problem List: Syncope secondary to acute left heart systolic or diastiolic failure  Pneumonia less likely. DM, II -stable HTN, stable  Arthritis, stable  2-D echocardiogram Increase activity   LOS: 2 days    Orpah Cobb  MD  02/28/2012, 10:02 AM

## 2012-02-28 NOTE — Care Management Note (Unsigned)
    Page 1 of 2   02/29/2012     11:32:56 AM   CARE MANAGEMENT NOTE 02/29/2012  Patient:  Heidi Dominguez, Heidi Dominguez   Account Number:  [id number]  Date Initiated:  02/28/2012  Documentation initiated by:  GRAVES-BIGELOW,Mayline Dragon  Subjective/Objective Assessment:   Pt admitted with syncope and cough. Pt being treated for possible pneumonia/  possible acute left heart systolic failure. Pt is from home with grandaughter and has additional family support. Her son was at bedside.     Action/Plan:   Pt has life alert at home. CM stressed to her importance of wearing necklace and actually pushing the button. Pt will benefit from PT consult here and HHRN/HHPT. MD to write orders. Pt chose [facility] for serivces.   Anticipated DC Date:  02/29/2012   Anticipated DC Plan:  HOME W HOME HEALTH SERVICES      DC Planning Services  CM consult      PAC Choice  DURABLE MEDICAL EQUIPMENT  HOME HEALTH   Choice offered to / List presented to:  C-1 Patient   DME arranged  Levan Hurst      DME agency  [facility]        Status of service:  In process, will continue to follow Medicare Important Message given?   (If response is "NO", the following Medicare IM given date fields will be blank) Date Medicare IM given:   Date Additional Medicare IM given:    Discharge Disposition:  HOME W HOME HEALTH SERVICES  Per UR Regulation:  Reviewed for med. necessity/level of care/duration of stay  If discussed at Long Length of Stay Meetings, dates discussed:    Comments:  02-29-12 1118 Tomi Bamberger, RN, BSN [phone] Pt's working with PT at time of visit. Pt will benefit from [facility] RN at d/c for disease management and will see if PT recommends any f/u recommendations. Will conitnue to monitor.

## 2012-02-28 NOTE — Progress Notes (Signed)
*  PRELIMINARY RESULTS* Echocardiogram 2D Echocardiogram has been performed.  Heidi Dominguez 02/28/2012, 2:56 PM

## 2012-02-29 ENCOUNTER — Inpatient Hospital Stay (HOSPITAL_COMMUNITY): Payer: Medicare Other

## 2012-02-29 DIAGNOSIS — E119 Type 2 diabetes mellitus without complications: Secondary | ICD-10-CM | POA: Diagnosis not present

## 2012-02-29 DIAGNOSIS — R079 Chest pain, unspecified: Secondary | ICD-10-CM | POA: Diagnosis not present

## 2012-02-29 DIAGNOSIS — I5021 Acute systolic (congestive) heart failure: Secondary | ICD-10-CM | POA: Diagnosis not present

## 2012-02-29 DIAGNOSIS — R42 Dizziness and giddiness: Secondary | ICD-10-CM | POA: Diagnosis not present

## 2012-02-29 DIAGNOSIS — E785 Hyperlipidemia, unspecified: Secondary | ICD-10-CM | POA: Diagnosis not present

## 2012-02-29 MED ORDER — RANITIDINE HCL 150 MG PO TABS: 150.0000 mg | ORAL_TABLET | Freq: Two times a day (BID) | ORAL | Status: DC

## 2012-02-29 MED ORDER — POTASSIUM CHLORIDE ER 10 MEQ PO TBCR: 10.0000 meq | EXTENDED_RELEASE_TABLET | Freq: Every morning | ORAL | Status: DC

## 2012-02-29 MED ORDER — TECHNETIUM TC 99M TETROFOSMIN IV KIT
10.0000 | PACK | Freq: Once | INTRAVENOUS | Status: AC | PRN
  Administered 2012-02-29: 10 via INTRAVENOUS

## 2012-02-29 MED ORDER — TECHNETIUM TC 99M TETROFOSMIN IV KIT
30.0000 | PACK | Freq: Once | INTRAVENOUS | Status: AC | PRN
Start: 2012-02-29 — End: 2012-02-29
  Administered 2012-02-29: 30 via INTRAVENOUS

## 2012-02-29 MED ORDER — REGADENOSON 0.4 MG/5ML IV SOLN
0.4000 mg | Freq: Once | INTRAVENOUS | Status: AC
  Administered 2012-02-29: 0.4 mg via INTRAVENOUS

## 2012-02-29 MED ORDER — FUROSEMIDE 20 MG PO TABS: 20.0000 mg | ORAL_TABLET | Freq: Every day | ORAL | Status: DC

## 2012-02-29 NOTE — Discharge Summary (Signed)
Physician Discharge Summary  Patient ID: Heidi Dominguez MRN: [id number] DOB/AGE: 08/31/1920 78 y.o.  Admit date: 02/26/2012 Discharge date: 02/29/2012  Admission Diagnoses: Syncope  Acute left heart systolic or diastiolic failure  Pneumonia   DM, II   HTN,   Arthritis,   Discharge Diagnoses:  Principle Problem:  *  Syncope secondary to hypotension from acute left heart diastiolic failure* Pneumonia ruled out.  DM, II -stable  HTN, stable  Arthritis, stable   Discharged Condition: good  Hospital Course: 78 years old female presented with vague chest pain, abdominal pain, shortness of breath and cough. She was found to be in acute left heart diastolic failure after investigations like echocardiogram and physical exam. She responded favorably to diuresis. She underwent nuclear stress test that failed to show reversible ischemia. She was discharged home in stable condition and will be followed by me in 1 week.  Consults: None  Significant Diagnostic Studies: labs: Near normal CBC, BMET, cardiac enzymes.   EKG-SR + 1st degree AV block.  Radiology: WUJ:[id number] or pulmonary edema,  Nuclear medicine: Lexiscan Myoview: Normal examination without evidence of pharmacologically induced myocardial ischemia. The calculated left ventricular ejection fraction is 57%.  Cardiac graphics: Echocardiogram: Left ventricle: The cavity size was normal. There was moderate concentric hypertrophy. Systolic function was normal. The estimated ejection fraction was in the range of 60% to 65%. There is mild hypokinesis of the midinferoseptal myocardium. Doppler parameters are consistent with abnormal left ventricular relaxation (grade 2 diastolic dysfunction).   Treatments: cardiac meds: benazepril (Lotensin), labetolol, amlodipine and furosemide  Discharge Exam: Blood pressure 172/81, pulse 70, temperature 98.4 F (36.9 C), temperature source Oral, resp. rate 18, height 5\' 4"  (1.626 m), weight  77.202 kg (170 lb 3.2 oz), SpO2 97.00%. HEENT: [place]/AT, Eyes-Brown, PERL, EOMI, Conjunctiva-Pink, Sclera-Non-icteric. Wears glasses  Neck: - JVD, No bruit, Trachea midline.  Lungs: Clear, Bilateral.  Cardiac: Regular rhythm, normal S1 and S2, no S3. III/VI systolic murmur right and left sternal borders.  Abdomen: Soft, non-tender.  Extremities: No edema present. No cyanosis. No clubbing.  CNS: AxOx3, Cranial nerves grossly intact, moves all 4 extremities. Right handed.  Skin: Warm and dry.  Disposition: 01-Home or Self Care   Medication List  As of 02/29/2012  6:06 PM   STOP taking these medications         methocarbamol 500 MG tablet      metoprolol succinate 25 MG 24 hr tablet         TAKE these medications         amLODipine-benazepril 5-20 MG per capsule   Commonly known as: LOTREL   Take 1 capsule by mouth daily.      B-complex with vitamin C tablet   Take 1-2 tablets by mouth daily.      darifenacin 7.5 MG 24 hr tablet   Commonly known as: ENABLEX   Take 7.5 mg by mouth daily.      fish oil-omega-3 fatty acids 1000 MG capsule   Take 1 g by mouth daily.      furosemide 20 MG tablet   Commonly known as: LASIX   Take 1 tablet (20 mg total) by mouth daily.      glimepiride 1 MG tablet   Commonly known as: AMARYL   Take 1 mg by mouth daily with breakfast.      Iron 240 (27 FE) MG Tabs   Take 1 tablet by mouth daily.      labetalol 200 MG tablet  Commonly known as: NORMODYNE   Take 200 mg by mouth 2 (two) times daily.      Magnesium 250 MG Tabs   Take 1 tablet by mouth daily.      potassium chloride 10 MEQ tablet   Commonly known as: K-DUR   Take 1 tablet (10 mEq total) by mouth every morning.      pravastatin 40 MG tablet   Commonly known as: PRAVACHOL   Take 40 mg by mouth daily.      ranitidine 150 MG tablet   Commonly known as: ZANTAC   Take 1 tablet (150 mg total) by mouth 2 (two) times daily.      sitaGLIPtin 100 MG tablet   Commonly known as:  JANUVIA   Take 100 mg by mouth daily.      vitamin B-12 500 MCG tablet   Commonly known as: CYANOCOBALAMIN   Take 500 mcg by mouth daily.      Vitamin D3 5000 UNITS Caps   Take 1 capsule by mouth daily.             SignedOrpah Cobb S 02/29/2012, 6:06 PM

## 2012-02-29 NOTE — Evaluation (Signed)
Physical Therapy Evaluation Patient Details Name: Heidi Dominguez MRN: [id number] DOB: 07/28/1921 Today's Date: 02/29/2012 Time: 4098-1191 PT Time Calculation (min): 22 min  PT Assessment / Plan / Recommendation Clinical Impression  Patient presented with syncope. She is at baseline for her functional mobility and has no skilled PT needs.     PT Assessment  Patent does not need any further PT services    Follow Up Recommendations  No PT follow up    Barriers to Discharge  None      Equipment Recommendations  None recommended by PT (rollator delivered already. )    Recommendations for Other Services  None   Frequency  N/A    Precautions / Restrictions Precautions Precautions: Fall   Pertinent Vitals/Pain VSS/ No pain      Mobility  Bed Mobility Bed Mobility: Supine to Sit;Sitting - Scoot to Edge of Bed Supine to Sit: 6: Modified independent (Device/Increase time) Sitting - Scoot to Edge of Bed: 6: Modified independent (Device/Increase time) Transfers Transfers: Sit to Stand;Stand to Sit Sit to Stand: With upper extremity assist;From bed;6: Modified independent (Device/Increase time) Stand to Sit: To bed;6: Modified independent (Device/Increase time) Details for Transfer Assistance: From rollator and bed. Able to perform safely and without assistance but requires significantly increased time.  Ambulation/Gait Ambulation/Gait Assistance: 6: Modified independent (Device/Increase time) Ambulation Distance (Feet): 200 Feet Assistive device: Rollator Ambulation/Gait Assistance Details: Adjusted rollator for handle height. Practiced placing on and off brakes and turning to sit for a rest. Patient aware of posture and self corrects intermittently.  Gait Pattern: Trunk flexed Gait velocity: speed variable depends on fatigue - slower with increased distance.      Visit Information  Last PT Received On: 02/29/12 Assistance Needed: +1    Subjective Data  Subjective: Patient  reports ready to go home. Patient Stated Goal: Home this afternoon   Prior Functioning  Home Living Lives With: Family Available Help at Discharge: Family;Available PRN/intermittently Type of Home: House Home Access: Level entry;Ramped entrance Home Layout: One level Bathroom Shower/Tub: Engineer, manufacturing systems: Standard Home Adaptive Equipment: Bedside commode/3-in-1;Shower chair with back;Straight cane;Walker - rolling (transport chair) Prior Function Level of Independence: Needs assistance Needs Assistance: Meal Prep;Bathing Bath: Minimal (assistance in and out of tub) Meal Prep:  (meals on wheels once a day. family for evening meal) Able to Take Stairs?: No Driving: No Vocation: Retired Musician: No difficulties    Cognition  Overall Cognitive Status: Appears within functional limits for tasks assessed/performed Arousal/Alertness: Awake/alert Orientation Level: Appears intact for tasks assessed Behavior During Session: [facility] for tasks performed    Extremity/Trunk Assessment Right Lower Extremity Assessment RLE ROM/Strength/Tone: Deficits RLE ROM/Strength/Tone Deficits: Chronic weakness leading to difficulty standing from lower position - has recently completed 6 weeks of HH PT. Grossly 4/5 RLE Sensation: WFL - Light Touch;WFL - Proprioception RLE Coordination: WFL - gross/fine motor Left Lower Extremity Assessment LLE ROM/Strength/Tone: Deficits LLE ROM/Strength/Tone Deficits: Chronic weakness leading to difficulty standing from lower position - has recently completed 6 weeks of HH PT. Grossly 4/5 LLE Sensation: WFL - Proprioception;WFL - Light Touch LLE Coordination: WFL - gross/fine motor   Balance High Level Balance High Level Balance Activites: Turns;Direction changes;Sudden stops High Level Balance Comments: modified indep. with walker  End of Session PT - End of Session Equipment Utilized During Treatment: Gait belt Activity Tolerance:  Patient tolerated treatment well Patient left: in bed;with call bell/phone within reach;with family/visitor present Nurse Communication:  (informed case manager of mobility)  Edwyna Perfect, PT  Pager [phone]  02/29/2012, 12:27 PM

## 2012-03-04 DIAGNOSIS — I509 Heart failure, unspecified: Secondary | ICD-10-CM | POA: Diagnosis not present

## 2012-03-04 DIAGNOSIS — R269 Unspecified abnormalities of gait and mobility: Secondary | ICD-10-CM | POA: Diagnosis not present

## 2012-03-04 DIAGNOSIS — R55 Syncope and collapse: Secondary | ICD-10-CM | POA: Diagnosis not present

## 2012-03-04 DIAGNOSIS — I5030 Unspecified diastolic (congestive) heart failure: Secondary | ICD-10-CM | POA: Diagnosis not present

## 2012-03-04 DIAGNOSIS — M6281 Muscle weakness (generalized): Secondary | ICD-10-CM | POA: Diagnosis not present

## 2012-03-04 DIAGNOSIS — E119 Type 2 diabetes mellitus without complications: Secondary | ICD-10-CM | POA: Diagnosis not present

## 2012-03-06 DIAGNOSIS — I509 Heart failure, unspecified: Secondary | ICD-10-CM | POA: Diagnosis not present

## 2012-03-06 DIAGNOSIS — E785 Hyperlipidemia, unspecified: Secondary | ICD-10-CM | POA: Diagnosis not present

## 2012-03-06 DIAGNOSIS — I10 Essential (primary) hypertension: Secondary | ICD-10-CM | POA: Diagnosis not present

## 2012-03-06 DIAGNOSIS — I5022 Chronic systolic (congestive) heart failure: Secondary | ICD-10-CM | POA: Diagnosis not present

## 2012-03-06 DIAGNOSIS — R269 Unspecified abnormalities of gait and mobility: Secondary | ICD-10-CM | POA: Diagnosis not present

## 2012-03-06 DIAGNOSIS — E119 Type 2 diabetes mellitus without complications: Secondary | ICD-10-CM | POA: Diagnosis not present

## 2012-03-06 DIAGNOSIS — M6281 Muscle weakness (generalized): Secondary | ICD-10-CM | POA: Diagnosis not present

## 2012-03-06 DIAGNOSIS — I5030 Unspecified diastolic (congestive) heart failure: Secondary | ICD-10-CM | POA: Diagnosis not present

## 2012-03-06 DIAGNOSIS — R55 Syncope and collapse: Secondary | ICD-10-CM | POA: Diagnosis not present

## 2012-03-07 DIAGNOSIS — M6281 Muscle weakness (generalized): Secondary | ICD-10-CM | POA: Diagnosis not present

## 2012-03-07 DIAGNOSIS — I509 Heart failure, unspecified: Secondary | ICD-10-CM | POA: Diagnosis not present

## 2012-03-07 DIAGNOSIS — R55 Syncope and collapse: Secondary | ICD-10-CM | POA: Diagnosis not present

## 2012-03-07 DIAGNOSIS — R269 Unspecified abnormalities of gait and mobility: Secondary | ICD-10-CM | POA: Diagnosis not present

## 2012-03-07 DIAGNOSIS — I5030 Unspecified diastolic (congestive) heart failure: Secondary | ICD-10-CM | POA: Diagnosis not present

## 2012-03-07 DIAGNOSIS — E119 Type 2 diabetes mellitus without complications: Secondary | ICD-10-CM | POA: Diagnosis not present

## 2012-03-08 DIAGNOSIS — E119 Type 2 diabetes mellitus without complications: Secondary | ICD-10-CM | POA: Diagnosis not present

## 2012-03-08 DIAGNOSIS — M6281 Muscle weakness (generalized): Secondary | ICD-10-CM | POA: Diagnosis not present

## 2012-03-08 DIAGNOSIS — I509 Heart failure, unspecified: Secondary | ICD-10-CM | POA: Diagnosis not present

## 2012-03-08 DIAGNOSIS — R55 Syncope and collapse: Secondary | ICD-10-CM | POA: Diagnosis not present

## 2012-03-08 DIAGNOSIS — R269 Unspecified abnormalities of gait and mobility: Secondary | ICD-10-CM | POA: Diagnosis not present

## 2012-03-08 DIAGNOSIS — I5030 Unspecified diastolic (congestive) heart failure: Secondary | ICD-10-CM | POA: Diagnosis not present

## 2012-03-11 DIAGNOSIS — M6281 Muscle weakness (generalized): Secondary | ICD-10-CM | POA: Diagnosis not present

## 2012-03-11 DIAGNOSIS — E119 Type 2 diabetes mellitus without complications: Secondary | ICD-10-CM | POA: Diagnosis not present

## 2012-03-11 DIAGNOSIS — R55 Syncope and collapse: Secondary | ICD-10-CM | POA: Diagnosis not present

## 2012-03-11 DIAGNOSIS — R269 Unspecified abnormalities of gait and mobility: Secondary | ICD-10-CM | POA: Diagnosis not present

## 2012-03-11 DIAGNOSIS — I5030 Unspecified diastolic (congestive) heart failure: Secondary | ICD-10-CM | POA: Diagnosis not present

## 2012-03-11 DIAGNOSIS — I509 Heart failure, unspecified: Secondary | ICD-10-CM | POA: Diagnosis not present

## 2012-03-13 DIAGNOSIS — R269 Unspecified abnormalities of gait and mobility: Secondary | ICD-10-CM | POA: Diagnosis not present

## 2012-03-13 DIAGNOSIS — M6281 Muscle weakness (generalized): Secondary | ICD-10-CM | POA: Diagnosis not present

## 2012-03-13 DIAGNOSIS — E119 Type 2 diabetes mellitus without complications: Secondary | ICD-10-CM | POA: Diagnosis not present

## 2012-03-13 DIAGNOSIS — I5030 Unspecified diastolic (congestive) heart failure: Secondary | ICD-10-CM | POA: Diagnosis not present

## 2012-03-13 DIAGNOSIS — I509 Heart failure, unspecified: Secondary | ICD-10-CM | POA: Diagnosis not present

## 2012-03-13 DIAGNOSIS — R55 Syncope and collapse: Secondary | ICD-10-CM | POA: Diagnosis not present

## 2012-03-14 DIAGNOSIS — R55 Syncope and collapse: Secondary | ICD-10-CM | POA: Diagnosis not present

## 2012-03-14 DIAGNOSIS — E119 Type 2 diabetes mellitus without complications: Secondary | ICD-10-CM | POA: Diagnosis not present

## 2012-03-14 DIAGNOSIS — I5030 Unspecified diastolic (congestive) heart failure: Secondary | ICD-10-CM | POA: Diagnosis not present

## 2012-03-14 DIAGNOSIS — M6281 Muscle weakness (generalized): Secondary | ICD-10-CM | POA: Diagnosis not present

## 2012-03-14 DIAGNOSIS — I509 Heart failure, unspecified: Secondary | ICD-10-CM | POA: Diagnosis not present

## 2012-03-14 DIAGNOSIS — R269 Unspecified abnormalities of gait and mobility: Secondary | ICD-10-CM | POA: Diagnosis not present

## 2012-03-15 DIAGNOSIS — I509 Heart failure, unspecified: Secondary | ICD-10-CM | POA: Diagnosis not present

## 2012-03-15 DIAGNOSIS — R55 Syncope and collapse: Secondary | ICD-10-CM | POA: Diagnosis not present

## 2012-03-15 DIAGNOSIS — E119 Type 2 diabetes mellitus without complications: Secondary | ICD-10-CM | POA: Diagnosis not present

## 2012-03-15 DIAGNOSIS — M6281 Muscle weakness (generalized): Secondary | ICD-10-CM | POA: Diagnosis not present

## 2012-03-15 DIAGNOSIS — I5030 Unspecified diastolic (congestive) heart failure: Secondary | ICD-10-CM | POA: Diagnosis not present

## 2012-03-15 DIAGNOSIS — R269 Unspecified abnormalities of gait and mobility: Secondary | ICD-10-CM | POA: Diagnosis not present

## 2012-03-18 DIAGNOSIS — I5030 Unspecified diastolic (congestive) heart failure: Secondary | ICD-10-CM | POA: Diagnosis not present

## 2012-03-18 DIAGNOSIS — R55 Syncope and collapse: Secondary | ICD-10-CM | POA: Diagnosis not present

## 2012-03-18 DIAGNOSIS — E119 Type 2 diabetes mellitus without complications: Secondary | ICD-10-CM | POA: Diagnosis not present

## 2012-03-18 DIAGNOSIS — I509 Heart failure, unspecified: Secondary | ICD-10-CM | POA: Diagnosis not present

## 2012-03-18 DIAGNOSIS — R269 Unspecified abnormalities of gait and mobility: Secondary | ICD-10-CM | POA: Diagnosis not present

## 2012-03-18 DIAGNOSIS — M6281 Muscle weakness (generalized): Secondary | ICD-10-CM | POA: Diagnosis not present

## 2012-03-20 DIAGNOSIS — I509 Heart failure, unspecified: Secondary | ICD-10-CM | POA: Diagnosis not present

## 2012-03-20 DIAGNOSIS — E119 Type 2 diabetes mellitus without complications: Secondary | ICD-10-CM | POA: Diagnosis not present

## 2012-03-20 DIAGNOSIS — M6281 Muscle weakness (generalized): Secondary | ICD-10-CM | POA: Diagnosis not present

## 2012-03-20 DIAGNOSIS — R269 Unspecified abnormalities of gait and mobility: Secondary | ICD-10-CM | POA: Diagnosis not present

## 2012-03-20 DIAGNOSIS — R55 Syncope and collapse: Secondary | ICD-10-CM | POA: Diagnosis not present

## 2012-03-20 DIAGNOSIS — I5030 Unspecified diastolic (congestive) heart failure: Secondary | ICD-10-CM | POA: Diagnosis not present

## 2012-03-22 DIAGNOSIS — M6281 Muscle weakness (generalized): Secondary | ICD-10-CM | POA: Diagnosis not present

## 2012-03-22 DIAGNOSIS — I5030 Unspecified diastolic (congestive) heart failure: Secondary | ICD-10-CM | POA: Diagnosis not present

## 2012-03-22 DIAGNOSIS — R55 Syncope and collapse: Secondary | ICD-10-CM | POA: Diagnosis not present

## 2012-03-22 DIAGNOSIS — E119 Type 2 diabetes mellitus without complications: Secondary | ICD-10-CM | POA: Diagnosis not present

## 2012-03-22 DIAGNOSIS — R269 Unspecified abnormalities of gait and mobility: Secondary | ICD-10-CM | POA: Diagnosis not present

## 2012-03-22 DIAGNOSIS — I509 Heart failure, unspecified: Secondary | ICD-10-CM | POA: Diagnosis not present

## 2012-03-25 DIAGNOSIS — M6281 Muscle weakness (generalized): Secondary | ICD-10-CM | POA: Diagnosis not present

## 2012-03-25 DIAGNOSIS — R55 Syncope and collapse: Secondary | ICD-10-CM | POA: Diagnosis not present

## 2012-03-25 DIAGNOSIS — E119 Type 2 diabetes mellitus without complications: Secondary | ICD-10-CM | POA: Diagnosis not present

## 2012-03-25 DIAGNOSIS — I5030 Unspecified diastolic (congestive) heart failure: Secondary | ICD-10-CM | POA: Diagnosis not present

## 2012-03-25 DIAGNOSIS — R269 Unspecified abnormalities of gait and mobility: Secondary | ICD-10-CM | POA: Diagnosis not present

## 2012-03-25 DIAGNOSIS — I509 Heart failure, unspecified: Secondary | ICD-10-CM | POA: Diagnosis not present

## 2012-03-27 DIAGNOSIS — I5030 Unspecified diastolic (congestive) heart failure: Secondary | ICD-10-CM | POA: Diagnosis not present

## 2012-03-27 DIAGNOSIS — E119 Type 2 diabetes mellitus without complications: Secondary | ICD-10-CM | POA: Diagnosis not present

## 2012-03-27 DIAGNOSIS — I509 Heart failure, unspecified: Secondary | ICD-10-CM | POA: Diagnosis not present

## 2012-03-27 DIAGNOSIS — R55 Syncope and collapse: Secondary | ICD-10-CM | POA: Diagnosis not present

## 2012-03-27 DIAGNOSIS — R269 Unspecified abnormalities of gait and mobility: Secondary | ICD-10-CM | POA: Diagnosis not present

## 2012-03-27 DIAGNOSIS — M6281 Muscle weakness (generalized): Secondary | ICD-10-CM | POA: Diagnosis not present

## 2012-03-29 DIAGNOSIS — M6281 Muscle weakness (generalized): Secondary | ICD-10-CM | POA: Diagnosis not present

## 2012-03-29 DIAGNOSIS — R269 Unspecified abnormalities of gait and mobility: Secondary | ICD-10-CM | POA: Diagnosis not present

## 2012-03-29 DIAGNOSIS — I5030 Unspecified diastolic (congestive) heart failure: Secondary | ICD-10-CM | POA: Diagnosis not present

## 2012-03-29 DIAGNOSIS — E119 Type 2 diabetes mellitus without complications: Secondary | ICD-10-CM | POA: Diagnosis not present

## 2012-03-29 DIAGNOSIS — I509 Heart failure, unspecified: Secondary | ICD-10-CM | POA: Diagnosis not present

## 2012-03-29 DIAGNOSIS — R55 Syncope and collapse: Secondary | ICD-10-CM | POA: Diagnosis not present

## 2012-04-02 DIAGNOSIS — R55 Syncope and collapse: Secondary | ICD-10-CM | POA: Diagnosis not present

## 2012-04-02 DIAGNOSIS — E119 Type 2 diabetes mellitus without complications: Secondary | ICD-10-CM | POA: Diagnosis not present

## 2012-04-02 DIAGNOSIS — R269 Unspecified abnormalities of gait and mobility: Secondary | ICD-10-CM | POA: Diagnosis not present

## 2012-04-02 DIAGNOSIS — I5030 Unspecified diastolic (congestive) heart failure: Secondary | ICD-10-CM | POA: Diagnosis not present

## 2012-04-02 DIAGNOSIS — I509 Heart failure, unspecified: Secondary | ICD-10-CM | POA: Diagnosis not present

## 2012-04-02 DIAGNOSIS — M6281 Muscle weakness (generalized): Secondary | ICD-10-CM | POA: Diagnosis not present

## 2012-04-04 DIAGNOSIS — E11311 Type 2 diabetes mellitus with unspecified diabetic retinopathy with macular edema: Secondary | ICD-10-CM | POA: Diagnosis not present

## 2012-04-04 DIAGNOSIS — E11319 Type 2 diabetes mellitus with unspecified diabetic retinopathy without macular edema: Secondary | ICD-10-CM | POA: Diagnosis not present

## 2012-04-04 DIAGNOSIS — E1139 Type 2 diabetes mellitus with other diabetic ophthalmic complication: Secondary | ICD-10-CM | POA: Diagnosis not present

## 2012-04-10 DIAGNOSIS — R55 Syncope and collapse: Secondary | ICD-10-CM | POA: Diagnosis not present

## 2012-04-10 DIAGNOSIS — E119 Type 2 diabetes mellitus without complications: Secondary | ICD-10-CM | POA: Diagnosis not present

## 2012-04-10 DIAGNOSIS — M6281 Muscle weakness (generalized): Secondary | ICD-10-CM | POA: Diagnosis not present

## 2012-04-10 DIAGNOSIS — I509 Heart failure, unspecified: Secondary | ICD-10-CM | POA: Diagnosis not present

## 2012-04-10 DIAGNOSIS — R269 Unspecified abnormalities of gait and mobility: Secondary | ICD-10-CM | POA: Diagnosis not present

## 2012-04-10 DIAGNOSIS — I5030 Unspecified diastolic (congestive) heart failure: Secondary | ICD-10-CM | POA: Diagnosis not present

## 2012-04-12 DIAGNOSIS — I10 Essential (primary) hypertension: Secondary | ICD-10-CM | POA: Diagnosis not present

## 2012-04-12 DIAGNOSIS — E119 Type 2 diabetes mellitus without complications: Secondary | ICD-10-CM | POA: Diagnosis not present

## 2012-04-12 DIAGNOSIS — N390 Urinary tract infection, site not specified: Secondary | ICD-10-CM | POA: Diagnosis not present

## 2012-04-23 DIAGNOSIS — E669 Obesity, unspecified: Secondary | ICD-10-CM | POA: Diagnosis not present

## 2012-04-23 DIAGNOSIS — I10 Essential (primary) hypertension: Secondary | ICD-10-CM | POA: Diagnosis not present

## 2012-04-23 DIAGNOSIS — E785 Hyperlipidemia, unspecified: Secondary | ICD-10-CM | POA: Diagnosis not present

## 2012-04-23 DIAGNOSIS — E119 Type 2 diabetes mellitus without complications: Secondary | ICD-10-CM | POA: Diagnosis not present

## 2012-05-01 DIAGNOSIS — Z1231 Encounter for screening mammogram for malignant neoplasm of breast: Secondary | ICD-10-CM | POA: Diagnosis not present

## 2012-05-07 DIAGNOSIS — L608 Other nail disorders: Secondary | ICD-10-CM | POA: Diagnosis not present

## 2012-05-07 DIAGNOSIS — Q828 Other specified congenital malformations of skin: Secondary | ICD-10-CM | POA: Diagnosis not present

## 2012-05-07 DIAGNOSIS — E1149 Type 2 diabetes mellitus with other diabetic neurological complication: Secondary | ICD-10-CM | POA: Diagnosis not present

## 2012-05-17 DIAGNOSIS — R3919 Other difficulties with micturition: Secondary | ICD-10-CM | POA: Diagnosis not present

## 2012-05-17 DIAGNOSIS — I10 Essential (primary) hypertension: Secondary | ICD-10-CM | POA: Diagnosis not present

## 2012-05-17 DIAGNOSIS — Z1331 Encounter for screening for depression: Secondary | ICD-10-CM | POA: Diagnosis not present

## 2012-05-17 DIAGNOSIS — Z23 Encounter for immunization: Secondary | ICD-10-CM | POA: Diagnosis not present

## 2012-05-17 DIAGNOSIS — M549 Dorsalgia, unspecified: Secondary | ICD-10-CM | POA: Diagnosis not present

## 2012-05-17 DIAGNOSIS — E7800 Pure hypercholesterolemia, unspecified: Secondary | ICD-10-CM | POA: Diagnosis not present

## 2012-05-28 DIAGNOSIS — H9110 Presbycusis, unspecified ear: Secondary | ICD-10-CM | POA: Diagnosis not present

## 2012-06-04 DIAGNOSIS — N390 Urinary tract infection, site not specified: Secondary | ICD-10-CM | POA: Diagnosis not present

## 2012-06-12 DIAGNOSIS — E785 Hyperlipidemia, unspecified: Secondary | ICD-10-CM | POA: Diagnosis not present

## 2012-06-12 DIAGNOSIS — E119 Type 2 diabetes mellitus without complications: Secondary | ICD-10-CM | POA: Diagnosis not present

## 2012-06-12 DIAGNOSIS — I5021 Acute systolic (congestive) heart failure: Secondary | ICD-10-CM | POA: Diagnosis not present

## 2012-06-12 DIAGNOSIS — I10 Essential (primary) hypertension: Secondary | ICD-10-CM | POA: Diagnosis not present

## 2012-06-14 DIAGNOSIS — N390 Urinary tract infection, site not specified: Secondary | ICD-10-CM | POA: Diagnosis not present

## 2012-06-18 DIAGNOSIS — N390 Urinary tract infection, site not specified: Secondary | ICD-10-CM | POA: Diagnosis not present

## 2012-06-19 DIAGNOSIS — E119 Type 2 diabetes mellitus without complications: Secondary | ICD-10-CM | POA: Diagnosis not present

## 2012-06-19 DIAGNOSIS — I5021 Acute systolic (congestive) heart failure: Secondary | ICD-10-CM | POA: Diagnosis not present

## 2012-06-19 DIAGNOSIS — E785 Hyperlipidemia, unspecified: Secondary | ICD-10-CM | POA: Diagnosis not present

## 2012-06-19 DIAGNOSIS — I10 Essential (primary) hypertension: Secondary | ICD-10-CM | POA: Diagnosis not present

## 2012-06-20 DIAGNOSIS — E11359 Type 2 diabetes mellitus with proliferative diabetic retinopathy without macular edema: Secondary | ICD-10-CM | POA: Diagnosis not present

## 2012-06-20 DIAGNOSIS — E1139 Type 2 diabetes mellitus with other diabetic ophthalmic complication: Secondary | ICD-10-CM | POA: Diagnosis not present

## 2012-06-28 DIAGNOSIS — IMO0002 Reserved for concepts with insufficient information to code with codable children: Secondary | ICD-10-CM | POA: Diagnosis not present

## 2012-07-03 DIAGNOSIS — E785 Hyperlipidemia, unspecified: Secondary | ICD-10-CM | POA: Diagnosis not present

## 2012-07-03 DIAGNOSIS — I5022 Chronic systolic (congestive) heart failure: Secondary | ICD-10-CM | POA: Diagnosis not present

## 2012-07-03 DIAGNOSIS — E119 Type 2 diabetes mellitus without complications: Secondary | ICD-10-CM | POA: Diagnosis not present

## 2012-07-03 DIAGNOSIS — I10 Essential (primary) hypertension: Secondary | ICD-10-CM | POA: Diagnosis not present

## 2012-07-08 DIAGNOSIS — IMO0002 Reserved for concepts with insufficient information to code with codable children: Secondary | ICD-10-CM | POA: Diagnosis not present

## 2012-07-19 DIAGNOSIS — E119 Type 2 diabetes mellitus without complications: Secondary | ICD-10-CM | POA: Diagnosis not present

## 2012-07-19 DIAGNOSIS — I10 Essential (primary) hypertension: Secondary | ICD-10-CM | POA: Diagnosis not present

## 2012-07-22 DIAGNOSIS — IMO0002 Reserved for concepts with insufficient information to code with codable children: Secondary | ICD-10-CM | POA: Diagnosis not present

## 2012-07-29 DIAGNOSIS — E1139 Type 2 diabetes mellitus with other diabetic ophthalmic complication: Secondary | ICD-10-CM | POA: Diagnosis not present

## 2012-07-29 DIAGNOSIS — E11319 Type 2 diabetes mellitus with unspecified diabetic retinopathy without macular edema: Secondary | ICD-10-CM | POA: Diagnosis not present

## 2012-08-01 DIAGNOSIS — I10 Essential (primary) hypertension: Secondary | ICD-10-CM | POA: Diagnosis not present

## 2012-08-01 DIAGNOSIS — E785 Hyperlipidemia, unspecified: Secondary | ICD-10-CM | POA: Diagnosis not present

## 2012-08-01 DIAGNOSIS — E119 Type 2 diabetes mellitus without complications: Secondary | ICD-10-CM | POA: Diagnosis not present

## 2012-08-01 DIAGNOSIS — E669 Obesity, unspecified: Secondary | ICD-10-CM | POA: Diagnosis not present

## 2012-08-07 ENCOUNTER — Encounter (HOSPITAL_BASED_OUTPATIENT_CLINIC_OR_DEPARTMENT_OTHER): Payer: Self-pay | Admitting: Emergency Medicine

## 2012-08-07 ENCOUNTER — Inpatient Hospital Stay (HOSPITAL_BASED_OUTPATIENT_CLINIC_OR_DEPARTMENT_OTHER)
Admission: EM | Admit: 2012-08-07 | Discharge: 2012-08-12 | DRG: 378 | Disposition: A | Discharge status: Other Acute Inpatient Hospital | Payer: Medicare Other | Attending: Emergency Medicine | Admitting: Emergency Medicine

## 2012-08-07 ENCOUNTER — Emergency Department (HOSPITAL_BASED_OUTPATIENT_CLINIC_OR_DEPARTMENT_OTHER): Payer: Medicare Other

## 2012-08-07 DIAGNOSIS — Z79899 Other long term (current) drug therapy: Secondary | ICD-10-CM

## 2012-08-07 DIAGNOSIS — M6250 Muscle wasting and atrophy, not elsewhere classified, unspecified site: Secondary | ICD-10-CM | POA: Diagnosis not present

## 2012-08-07 DIAGNOSIS — R110 Nausea: Secondary | ICD-10-CM | POA: Diagnosis present

## 2012-08-07 DIAGNOSIS — K5289 Other specified noninfective gastroenteritis and colitis: Secondary | ICD-10-CM

## 2012-08-07 DIAGNOSIS — I130 Hypertensive heart and chronic kidney disease with heart failure and stage 1 through stage 4 chronic kidney disease, or unspecified chronic kidney disease: Secondary | ICD-10-CM | POA: Diagnosis present

## 2012-08-07 DIAGNOSIS — K559 Vascular disorder of intestine, unspecified: Secondary | ICD-10-CM | POA: Diagnosis present

## 2012-08-07 DIAGNOSIS — K55059 Acute (reversible) ischemia of intestine, part and extent unspecified: Secondary | ICD-10-CM | POA: Diagnosis not present

## 2012-08-07 DIAGNOSIS — R197 Diarrhea, unspecified: Secondary | ICD-10-CM | POA: Diagnosis present

## 2012-08-07 DIAGNOSIS — K625 Hemorrhage of anus and rectum: Secondary | ICD-10-CM | POA: Diagnosis not present

## 2012-08-07 DIAGNOSIS — N390 Urinary tract infection, site not specified: Secondary | ICD-10-CM | POA: Diagnosis not present

## 2012-08-07 DIAGNOSIS — N189 Chronic kidney disease, unspecified: Secondary | ICD-10-CM | POA: Diagnosis present

## 2012-08-07 DIAGNOSIS — R1033 Periumbilical pain: Secondary | ICD-10-CM | POA: Diagnosis not present

## 2012-08-07 DIAGNOSIS — R109 Unspecified abdominal pain: Secondary | ICD-10-CM | POA: Diagnosis present

## 2012-08-07 DIAGNOSIS — Z9181 History of falling: Secondary | ICD-10-CM | POA: Diagnosis not present

## 2012-08-07 DIAGNOSIS — Z8701 Personal history of pneumonia (recurrent): Secondary | ICD-10-CM | POA: Diagnosis not present

## 2012-08-07 DIAGNOSIS — K2100 Gastro-esophageal reflux disease with esophagitis, without bleeding: Secondary | ICD-10-CM | POA: Diagnosis not present

## 2012-08-07 DIAGNOSIS — K5190 Ulcerative colitis, unspecified, without complications: Secondary | ICD-10-CM | POA: Diagnosis not present

## 2012-08-07 DIAGNOSIS — K921 Melena: Secondary | ICD-10-CM

## 2012-08-07 DIAGNOSIS — E785 Hyperlipidemia, unspecified: Secondary | ICD-10-CM | POA: Diagnosis present

## 2012-08-07 DIAGNOSIS — I509 Heart failure, unspecified: Secondary | ICD-10-CM | POA: Diagnosis present

## 2012-08-07 DIAGNOSIS — R5383 Other fatigue: Secondary | ICD-10-CM | POA: Diagnosis not present

## 2012-08-07 DIAGNOSIS — Z8739 Personal history of other diseases of the musculoskeletal system and connective tissue: Secondary | ICD-10-CM

## 2012-08-07 DIAGNOSIS — R062 Wheezing: Secondary | ICD-10-CM | POA: Diagnosis not present

## 2012-08-07 DIAGNOSIS — Z90710 Acquired absence of both cervix and uterus: Secondary | ICD-10-CM | POA: Diagnosis not present

## 2012-08-07 DIAGNOSIS — R531 Weakness: Secondary | ICD-10-CM

## 2012-08-07 DIAGNOSIS — K219 Gastro-esophageal reflux disease without esophagitis: Secondary | ICD-10-CM | POA: Diagnosis present

## 2012-08-07 DIAGNOSIS — R001 Bradycardia, unspecified: Secondary | ICD-10-CM

## 2012-08-07 DIAGNOSIS — E119 Type 2 diabetes mellitus without complications: Secondary | ICD-10-CM | POA: Diagnosis not present

## 2012-08-07 DIAGNOSIS — K529 Noninfective gastroenteritis and colitis, unspecified: Secondary | ICD-10-CM | POA: Diagnosis present

## 2012-08-07 DIAGNOSIS — R5381 Other malaise: Secondary | ICD-10-CM | POA: Diagnosis not present

## 2012-08-07 DIAGNOSIS — K591 Functional diarrhea: Secondary | ICD-10-CM | POA: Diagnosis not present

## 2012-08-07 DIAGNOSIS — M6281 Muscle weakness (generalized): Secondary | ICD-10-CM | POA: Diagnosis not present

## 2012-08-07 DIAGNOSIS — R933 Abnormal findings on diagnostic imaging of other parts of digestive tract: Secondary | ICD-10-CM | POA: Diagnosis not present

## 2012-08-07 DIAGNOSIS — I10 Essential (primary) hypertension: Secondary | ICD-10-CM | POA: Diagnosis not present

## 2012-08-07 DIAGNOSIS — K922 Gastrointestinal hemorrhage, unspecified: Secondary | ICD-10-CM | POA: Diagnosis not present

## 2012-08-07 DIAGNOSIS — R262 Difficulty in walking, not elsewhere classified: Secondary | ICD-10-CM | POA: Diagnosis not present

## 2012-08-07 HISTORY — DX: Gastro-esophageal reflux disease without esophagitis: K21.9

## 2012-08-07 LAB — CBC
MCH: 29.1 pg (ref 26.0–34.0)
MCHC: 32 g/dL (ref 30.0–36.0)
Platelets: 147 10*3/uL — ABNORMAL LOW (ref 150–400)

## 2012-08-07 LAB — BASIC METABOLIC PANEL
Chloride: 102 mEq/L (ref 96–112)
Creatinine, Ser: 0.8 mg/dL (ref 0.50–1.10)
GFR calc Af Amer: 74 mL/min — ABNORMAL LOW (ref >90)
Potassium: 3.6 mEq/L (ref 3.5–5.1)
Sodium: 143 mEq/L (ref 135–145)

## 2012-08-07 LAB — CBC WITH DIFFERENTIAL/PLATELET
Basophils Absolute: 0 10*3/uL (ref 0.0–0.1)
Basophils Relative: 0 % (ref 0–1)
MCHC: 32.9 g/dL (ref 30.0–36.0)
Monocytes Absolute: 1.2 10*3/uL — ABNORMAL HIGH (ref 0.1–1.0)
Neutro Abs: 9.1 10*3/uL — ABNORMAL HIGH (ref 1.7–7.7)
Neutrophils Relative %: 78 % — ABNORMAL HIGH (ref 43–77)
Platelets: 156 10*3/uL (ref 150–400)
RDW: 13.9 % (ref 11.5–15.5)

## 2012-08-07 LAB — GLUCOSE, CAPILLARY: Glucose-Capillary: 59 mg/dL — ABNORMAL LOW (ref 70–99)

## 2012-08-07 LAB — OCCULT BLOOD X 1 CARD TO LAB, STOOL: Fecal Occult Bld: POSITIVE — AB

## 2012-08-07 LAB — APTT: aPTT: 30 seconds (ref 24–37)

## 2012-08-07 MED ORDER — FERROUS GLUCONATE 324 (38 FE) MG PO TABS
324.0000 mg | ORAL_TABLET | Freq: Every day | ORAL | Status: DC
  Administered 2012-08-08 – 2012-08-12 (×5): 324 mg via ORAL
  Filled 2012-08-07 (×7): qty 1

## 2012-08-07 MED ORDER — CYANOCOBALAMIN 500 MCG PO TABS
500.0000 ug | ORAL_TABLET | Freq: Every day | ORAL | Status: DC
  Administered 2012-08-07 – 2012-08-12 (×6): 500 ug via ORAL
  Filled 2012-08-07 (×6): qty 1

## 2012-08-07 MED ORDER — BENAZEPRIL HCL 20 MG PO TABS
20.0000 mg | ORAL_TABLET | Freq: Every day | ORAL | Status: DC
  Administered 2012-08-07 – 2012-08-12 (×6): 20 mg via ORAL
  Filled 2012-08-07 (×6): qty 1

## 2012-08-07 MED ORDER — INSULIN ASPART 100 UNIT/ML ~~LOC~~ SOLN
0.0000 [IU] | Freq: Three times a day (TID) | SUBCUTANEOUS | Status: DC
  Administered 2012-08-07: 3 [IU] via SUBCUTANEOUS
  Administered 2012-08-08 – 2012-08-09 (×3): 2 [IU] via SUBCUTANEOUS
  Administered 2012-08-11: 1 [IU] via SUBCUTANEOUS
  Administered 2012-08-11: 2 [IU] via SUBCUTANEOUS
  Administered 2012-08-12: 1 [IU] via SUBCUTANEOUS

## 2012-08-07 MED ORDER — OMEGA-3 FATTY ACIDS 1000 MG PO CAPS: 1.0000 g | ORAL_CAPSULE | Freq: Every day | ORAL | Status: DC

## 2012-08-07 MED ORDER — METRONIDAZOLE IN NACL 5-0.79 MG/ML-% IV SOLN
500.0000 mg | Freq: Once | INTRAVENOUS | Status: AC
  Administered 2012-08-07: 500 mg via INTRAVENOUS
  Filled 2012-08-07: qty 100

## 2012-08-07 MED ORDER — MAGNESIUM SULFATE 40 MG/ML IJ SOLN
2.0000 g | Freq: Once | INTRAMUSCULAR | Status: AC
  Administered 2012-08-07: 2 g via INTRAVENOUS
  Filled 2012-08-07: qty 50

## 2012-08-07 MED ORDER — DARIFENACIN HYDROBROMIDE ER 7.5 MG PO TB24
7.5000 mg | ORAL_TABLET | Freq: Every day | ORAL | Status: DC
  Administered 2012-08-07 – 2012-08-12 (×6): 7.5 mg via ORAL
  Filled 2012-08-07 (×6): qty 1

## 2012-08-07 MED ORDER — ONDANSETRON HCL 4 MG PO TABS: 4.0000 mg | ORAL_TABLET | Freq: Four times a day (QID) | ORAL | Status: DC | PRN

## 2012-08-07 MED ORDER — MAGNESIUM 250 MG PO TABS: 1.0000 | ORAL_TABLET | Freq: Every day | ORAL | Status: DC

## 2012-08-07 MED ORDER — OXYCODONE HCL 5 MG PO TABS: 5.0000 mg | ORAL_TABLET | ORAL | Status: DC | PRN

## 2012-08-07 MED ORDER — SIMVASTATIN 20 MG PO TABS
20.0000 mg | ORAL_TABLET | Freq: Every day | ORAL | Status: DC
  Administered 2012-08-07 – 2012-08-11 (×5): 20 mg via ORAL
  Filled 2012-08-07 (×6): qty 1

## 2012-08-07 MED ORDER — IRON 240 (27 FE) MG PO TABS: 1.0000 | ORAL_TABLET | Freq: Every day | ORAL | Status: DC

## 2012-08-07 MED ORDER — IOHEXOL 300 MG/ML  SOLN
100.0000 mL | Freq: Once | INTRAMUSCULAR | Status: AC | PRN
  Administered 2012-08-07: 100 mL via INTRAVENOUS

## 2012-08-07 MED ORDER — AMLODIPINE BESYLATE 5 MG PO TABS
5.0000 mg | ORAL_TABLET | Freq: Every day | ORAL | Status: DC
  Administered 2012-08-07 – 2012-08-12 (×6): 5 mg via ORAL
  Filled 2012-08-07 (×6): qty 1

## 2012-08-07 MED ORDER — B COMPLEX-C PO TABS
1.0000 | ORAL_TABLET | Freq: Every day | ORAL | Status: DC
  Administered 2012-08-07 – 2012-08-12 (×5): 1 via ORAL
  Filled 2012-08-07 (×6): qty 2

## 2012-08-07 MED ORDER — AMLODIPINE BESY-BENAZEPRIL HCL 5-20 MG PO CAPS: 1.0000 | ORAL_CAPSULE | Freq: Every day | ORAL | Status: DC

## 2012-08-07 MED ORDER — BISACODYL 5 MG PO TBEC: 5.0000 mg | DELAYED_RELEASE_TABLET | Freq: Every day | ORAL | Status: DC | PRN

## 2012-08-07 MED ORDER — IOHEXOL 300 MG/ML  SOLN
50.0000 mL | Freq: Once | INTRAMUSCULAR | Status: AC | PRN
  Administered 2012-08-07: 50 mL via ORAL

## 2012-08-07 MED ORDER — GLUCOSE 40 % PO GEL: 1.0000 | ORAL | Status: DC | PRN

## 2012-08-07 MED ORDER — NAPHAZOLINE-GLYCERIN 0.012-0.2 % OP SOLN
1.0000 [drp] | OPHTHALMIC | Status: DC | PRN
Start: 2012-08-07 — End: 2012-08-12
  Administered 2012-08-07: 1 [drp] via OPHTHALMIC
  Filled 2012-08-07 (×2): qty 15

## 2012-08-07 MED ORDER — SENNOSIDES-DOCUSATE SODIUM 8.6-50 MG PO TABS
1.0000 | ORAL_TABLET | Freq: Every evening | ORAL | Status: DC | PRN
  Filled 2012-08-07: qty 1

## 2012-08-07 MED ORDER — GLUCOSE 40 % PO GEL
ORAL | Status: AC
  Administered 2012-08-07: 37.5 g
  Filled 2012-08-07: qty 1

## 2012-08-07 MED ORDER — ALUM & MAG HYDROXIDE-SIMETH 200-200-20 MG/5ML PO SUSP: 30.0000 mL | Freq: Four times a day (QID) | ORAL | Status: DC | PRN

## 2012-08-07 MED ORDER — CIPROFLOXACIN IN D5W 400 MG/200ML IV SOLN
400.0000 mg | Freq: Once | INTRAVENOUS | Status: AC
  Administered 2012-08-07: 400 mg via INTRAVENOUS
  Filled 2012-08-07: qty 200

## 2012-08-07 MED ORDER — OMEGA-3-ACID ETHYL ESTERS 1 G PO CAPS
1.0000 g | ORAL_CAPSULE | Freq: Every day | ORAL | Status: DC
  Administered 2012-08-07 – 2012-08-12 (×6): 1 g via ORAL
  Filled 2012-08-07 (×6): qty 1

## 2012-08-07 MED ORDER — SODIUM CHLORIDE 0.9 % IV SOLN
INTRAVENOUS | Status: DC
  Administered 2012-08-07: via INTRAVENOUS
  Administered 2012-08-08: 75 mL/h via INTRAVENOUS

## 2012-08-07 MED ORDER — SODIUM CHLORIDE 0.9 % IJ SOLN
3.0000 mL | Freq: Two times a day (BID) | INTRAMUSCULAR | Status: DC
  Administered 2012-08-08 – 2012-08-12 (×8): 3 mL via INTRAVENOUS

## 2012-08-07 MED ORDER — SODIUM CHLORIDE 0.9 % IV SOLN
INTRAVENOUS | Status: DC
  Administered 2012-08-07: 06:00:00 via INTRAVENOUS

## 2012-08-07 MED ORDER — VITAMIN B-12 500 MCG PO TABS: 500.0000 ug | ORAL_TABLET | Freq: Every day | ORAL | Status: DC

## 2012-08-07 MED ORDER — ACETAMINOPHEN 650 MG RE SUPP: 650.0000 mg | Freq: Four times a day (QID) | RECTAL | Status: DC | PRN

## 2012-08-07 MED ORDER — MAGNESIUM OXIDE 400 (241.3 MG) MG PO TABS
200.0000 mg | ORAL_TABLET | Freq: Every day | ORAL | Status: DC
  Administered 2012-08-07: 13:00:00 via ORAL
  Administered 2012-08-08 – 2012-08-12 (×5): 200 mg via ORAL
  Filled 2012-08-07 (×6): qty 0.5

## 2012-08-07 MED ORDER — ONDANSETRON HCL 4 MG/2ML IJ SOLN
4.0000 mg | Freq: Four times a day (QID) | INTRAMUSCULAR | Status: DC | PRN
  Administered 2012-08-09: 4 mg via INTRAVENOUS
  Filled 2012-08-07: qty 2

## 2012-08-07 MED ORDER — LABETALOL HCL 200 MG PO TABS
200.0000 mg | ORAL_TABLET | Freq: Two times a day (BID) | ORAL | Status: DC
  Administered 2012-08-07 – 2012-08-12 (×11): 200 mg via ORAL
  Filled 2012-08-07 (×12): qty 1

## 2012-08-07 MED ORDER — SACCHAROMYCES BOULARDII 250 MG PO CAPS
500.0000 mg | ORAL_CAPSULE | Freq: Two times a day (BID) | ORAL | Status: DC
  Administered 2012-08-07 – 2012-08-12 (×10): 500 mg via ORAL
  Filled 2012-08-07 (×11): qty 2

## 2012-08-07 MED ORDER — ACETAMINOPHEN 325 MG PO TABS: 650.0000 mg | ORAL_TABLET | Freq: Four times a day (QID) | ORAL | Status: DC | PRN

## 2012-08-07 NOTE — Progress Notes (Addendum)
Hypoglycemic Event  CBG: 59  Treatment: 1 tube instant glucose  Symptoms: None  Follow-up CBG: Time:2150 CBG Result:105  Possible Reasons for Event: Unknown  Comments/MD notified: Yes.     Heidi Dominguez  Remember to initiate Hypoglycemia Order Set & complete

## 2012-08-07 NOTE — ED Provider Notes (Signed)
History     CSN: [id number]  Arrival date & time 08/07/12  4098   First MD Initiated Contact with Patient 08/07/12 0550      Chief Complaint  Patient presents with  . Rectal Bleeding    (Consider location/radiation/quality/duration/timing/severity/associated sxs/prior treatment) HPI This is an 78 year old female who developed nausea, retching and diarrhea yesterday. She initially attributed this to eating at [employer]. Along with these symptoms she developed moderate pain in her left lower quadrant and lesser pain in her right lower quadrant. The nausea has improved but the abdominal pain is persisting. This morning her she started passing bright red blood per rectum. Her daughter states she passed at least several ounces of blood and clots. She denies lightheadedness chest pain or shortness of breath.  Past Medical History  Diagnosis Date  . Diabetes mellitus   . Hypertension   . High cholesterol   . Osteoarthritis   . Degenerative joint disease of shoulder region   . HOH (hard of hearing)   . H/O knee surgery   . H/O shoulder surgery   . Complication of anesthesia     I have a hard time waking up"  . CHF (congestive heart failure)   . Pneumonia   . Chronic kidney disease     incontinence    Past Surgical History  Procedure Date  . Shoulder surgery   . Knee surgery   . Back surgery   . Abdominal hysterectomy   . Cataracts     bilateral    No family history on file.  History  Substance Use Topics  . Smoking status: Never Smoker   . Smokeless tobacco: Never Used  . Alcohol Use: No    OB History    Grav Para Term Preterm Abortions TAB SAB Ect Mult Living                  Review of Systems  All other systems reviewed and are negative.    Allergies  Catapres; Covera-hs; Other; and Sulfa antibiotics  Home Medications   Current Outpatient Rx  Name  Route  Sig  Dispense  Refill  . AMLODIPINE BESY-BENAZEPRIL HCL 5-20 MG PO CAPS   Oral   Take 1 capsule  by mouth daily.         . B COMPLEX-C PO TABS   Oral   Take 1-2 tablets by mouth daily.         Marland Kitchen VITAMIN D3 5000 UNITS PO CAPS   Oral   Take 1 capsule by mouth daily.         Marland Kitchen DARIFENACIN HYDROBROMIDE ER 7.5 MG PO TB24   Oral   Take 7.5 mg by mouth daily.         . IRON 240 (27 FE) MG PO TABS   Oral   Take 1 tablet by mouth daily.         . OMEGA-3 FATTY ACIDS 1000 MG PO CAPS   Oral   Take 1 g by mouth daily.         . FUROSEMIDE 20 MG PO TABS   Oral   Take 1 tablet (20 mg total) by mouth daily.   30 tablet   1   . GLIMEPIRIDE 1 MG PO TABS   Oral   Take 1 mg by mouth daily with breakfast.         . LABETALOL HCL 200 MG PO TABS   Oral   Take 200 mg by mouth 2 (  two) times daily.         Marland Kitchen MAGNESIUM 250 MG PO TABS   Oral   Take 1 tablet by mouth daily.         Marland Kitchen POTASSIUM CHLORIDE ER 10 MEQ PO TBCR   Oral   Take 1 tablet (10 mEq total) by mouth every morning.   30 tablet   1   . PRAVASTATIN SODIUM 40 MG PO TABS   Oral   Take 40 mg by mouth daily.         Marland Kitchen RANITIDINE HCL 150 MG PO TABS   Oral   Take 1 tablet (150 mg total) by mouth 2 (two) times daily.   60 tablet   1   . SITAGLIPTIN PHOSPHATE 100 MG PO TABS   Oral   Take 100 mg by mouth daily.         Marland Kitchen VITAMIN B-12 500 MCG PO TABS   Oral   Take 500 mcg by mouth daily.           BP 176/61  Pulse 88  Temp 98.5 F (36.9 C) (Oral)  Resp 18  Ht 5\' 4"  (1.626 m)  Wt 183 lb (83.008 kg)  BMI 31.41 kg/m2  SpO2 97%  Physical Exam General: Well-developed, well-nourished female in no acute distress; appears younger than age of record HENT: normocephalic, atraumatic Eyes: pupils equal round and reactive to light; extraocular muscles intact; arcus senilis bilaterally Neck: supple Heart: regular rate and rhythm Lungs: clear to auscultation bilaterally Abdomen: soft; nondistended; left lower quadrant tenderness greater than right lower quadrant tenderness; no masses or  hepatosplenomegaly; bowel sounds present Rectal: Normal sphincter tone; gross blood in vault Extremities: No deformity; full range of motion; pulses normal; trace edema of lower legs Neurologic: Awake, alert; motor function intact in all extremities and symmetric; no facial droop Skin: Warm and dry Psychiatric: Normal mood and affect    ED Course  Procedures (including critical care time)     MDM   Nursing notes and vitals signs, including pulse oximetry, reviewed.  Summary of this visit's results, reviewed by myself:  Labs:  Results for orders placed during the hospital encounter of 08/07/12 (from the past 24 hour(s))  OCCULT BLOOD X 1 CARD TO LAB, STOOL     Status: Abnormal   Collection Time   08/07/12  6:00 AM      Component Value Range   Fecal Occult Bld POSITIVE (*) NEGATIVE  CBC WITH DIFFERENTIAL     Status: Abnormal   Collection Time   08/07/12  6:05 AM      Component Value Range   WBC 11.6 (*) 4.0 - 10.5 K/uL   RBC 4.73  3.87 - 5.11 MIL/uL   Hemoglobin 14.1  12.0 - 15.0 g/dL   HCT 14.7  82.9 - 56.2 %   MCV 90.5  78.0 - 100.0 fL   MCH 29.8  26.0 - 34.0 pg   MCHC 32.9  30.0 - 36.0 g/dL   RDW 13.0  86.5 - 78.4 %   Platelets 156  150 - 400 K/uL   Neutrophils Relative 78 (*) 43 - 77 %   Neutro Abs 9.1 (*) 1.7 - 7.7 K/uL   Lymphocytes Relative 11 (*) 12 - 46 %   Lymphs Abs 1.2  0.7 - 4.0 K/uL   Monocytes Relative 10  3 - 12 %   Monocytes Absolute 1.2 (*) 0.1 - 1.0 K/uL   Eosinophils Relative 1  0 - 5 %  Eosinophils Absolute 0.1  0.0 - 0.7 K/uL   Basophils Relative 0  0 - 1 %   Basophils Absolute 0.0  0.0 - 0.1 K/uL  APTT     Status: Normal   Collection Time   08/07/12  6:05 AM      Component Value Range   aPTT 30  24 - 37 seconds  PROTIME-INR     Status: Normal   Collection Time   08/07/12  6:05 AM      Component Value Range   Prothrombin Time 12.9  11.6 - 15.2 seconds   INR 0.98  0.00 - 1.49    Imaging Studies: No results found.  7:33 AM CT scan  pending. Dr. Karma Ganja will review results and make disposition. Admission is anticipated.        Hanley Seamen, MD 08/07/12 [phone]

## 2012-08-07 NOTE — Consult Note (Signed)
Referring Provider: Dr. Ramiro Harvest (Hospitalist) Primary Care Physician:  Ginette Otto, MD Primary Gastroenterologist:  Dr. Dorena Cookey  Reason for Consultation:  Segmental colitis  HPI: Heidi Dominguez is a 78 y.o. female admitted through the emergency room earlier today because of a roughly 12 hour history of low abdominal pain, and diarrhea that became bloody in character (small-volume hematochezia, 1-2 ounces of blood).   A CT scan in the emergency room showed evidence of colitis in the region of the splenic flexure. White count slightly elevated at 11.6, hemoglobin normal at 14.1.  Back in February, the patient had small-volume hematochezia suggestive of a minor diverticular bleed, and with a history of colon polyps in the past, she had a colonoscopy most recently by Dr. Madilyn Fireman in September 2009 which was negative for polyps but did show some sigmoid diverticulosis.  As of this time, the patient states that it has been approximately 10 hours since her last bowel movement, which had some blood in it. She feels markedly better than yesterday; she estimates that her degree of improvement is about 70%.   Past Medical History  Diagnosis Date  . Diabetes mellitus   . Hypertension   . High cholesterol   . Osteoarthritis   . Degenerative joint disease of shoulder region   . HOH (hard of hearing)   . H/O knee surgery   . H/O shoulder surgery   . Complication of anesthesia     I have a hard time waking up"  . CHF (congestive heart failure)   . Pneumonia   . GERD (gastroesophageal reflux disease)   . Chronic kidney disease     incontinence/ fAMILYN DENIES HX OF CKD    Past Surgical History  Procedure Date  . Shoulder surgery   . Knee surgery   . Back surgery   . Abdominal hysterectomy   . Cataracts     bilateral    Prior to Admission medications   Medication Sig Start Date End Date Taking? Authorizing Provider  amLODipine-benazepril (LOTREL) 5-20 MG per capsule Take  1 capsule by mouth daily.    Historical Provider, MD  B Complex-C (B-COMPLEX WITH VITAMIN C) tablet Take 1-2 tablets by mouth daily.    Historical Provider, MD  Cholecalciferol (VITAMIN D3) 5000 UNITS CAPS Take 1 capsule by mouth daily.    Historical Provider, MD  darifenacin (ENABLEX) 7.5 MG 24 hr tablet Take 7.5 mg by mouth daily.    Historical Provider, MD  Ferrous Gluconate (IRON) 240 (27 FE) MG TABS Take 1 tablet by mouth daily.    Historical Provider, MD  fish oil-omega-3 fatty acids 1000 MG capsule Take 1 g by mouth daily.    Historical Provider, MD  furosemide (LASIX) 20 MG tablet Take 1 tablet (20 mg total) by mouth daily. 02/29/12 02/28/13  Ricki Rodriguez, MD  glimepiride (AMARYL) 1 MG tablet Take 1 mg by mouth daily with breakfast.    Historical Provider, MD  labetalol (NORMODYNE) 200 MG tablet Take 200 mg by mouth 2 (two) times daily.    Historical Provider, MD  Magnesium 250 MG TABS Take 1 tablet by mouth daily.    Historical Provider, MD  potassium chloride (K-DUR) 10 MEQ tablet Take 1 tablet (10 mEq total) by mouth every morning. 02/29/12 02/28/13  Ricki Rodriguez, MD  pravastatin (PRAVACHOL) 40 MG tablet Take 40 mg by mouth daily.    Historical Provider, MD  ranitidine (ZANTAC) 150 MG tablet Take 1 tablet (150 mg total) by  mouth 2 (two) times daily. 02/29/12 02/28/13  Ricki Rodriguez, MD  sitaGLIPtin (JANUVIA) 100 MG tablet Take 100 mg by mouth daily.    Historical Provider, MD  vitamin B-12 (CYANOCOBALAMIN) 500 MCG tablet Take 500 mcg by mouth daily.    Historical Provider, MD    Current Facility-Administered Medications  Medication Dose Route Frequency Provider Last Rate Last Dose  . 0.9 %  sodium chloride infusion   Intravenous Continuous Rodolph Bong, MD      . acetaminophen (TYLENOL) tablet 650 mg  650 mg Oral Q6H PRN Rodolph Bong, MD       Or  . acetaminophen (TYLENOL) suppository 650 mg  650 mg Rectal Q6H PRN Rodolph Bong, MD      . alum & mag hydroxide-simeth  (MAALOX/MYLANTA) 200-200-20 MG/5ML suspension 30 mL  30 mL Oral Q6H PRN Rodolph Bong, MD      . amLODipine (NORVASC) tablet 5 mg  5 mg Oral Daily Rodolph Bong, MD   5 mg at 08/07/12 1312  . B-complex with vitamin C tablet 1-2 tablet  1-2 tablet Oral Daily Rodolph Bong, MD   1 tablet at 08/07/12 1313  . benazepril (LOTENSIN) tablet 20 mg  20 mg Oral Daily Rodolph Bong, MD      . bisacodyl (DULCOLAX) EC tablet 5 mg  5 mg Oral Daily PRN Rodolph Bong, MD      . cyanocobalamin tablet 500 mcg  500 mcg Oral Daily Rodolph Bong, MD   500 mcg at 08/07/12 1315  . darifenacin (ENABLEX) 24 hr tablet 7.5 mg  7.5 mg Oral Daily Rodolph Bong, MD   7.5 mg at 08/07/12 1315  . ferrous gluconate (FERGON) tablet 324 mg  324 mg Oral Q breakfast Rodolph Bong, MD      . insulin aspart (novoLOG) injection 0-9 Units  0-9 Units Subcutaneous TID WC Rodolph Bong, MD      . labetalol (NORMODYNE) tablet 200 mg  200 mg Oral BID Rodolph Bong, MD   200 mg at 08/07/12 1316  . magnesium oxide (MAG-OX) tablet 200 mg  200 mg Oral Daily Rodolph Bong, MD      . omega-3 acid ethyl esters (LOVAZA) capsule 1 g  1 g Oral Daily Rodolph Bong, MD   1 g at 08/07/12 1317  . ondansetron (ZOFRAN) tablet 4 mg  4 mg Oral Q6H PRN Rodolph Bong, MD       Or  . ondansetron [facility]) injection 4 mg  4 mg Intravenous Q6H PRN Rodolph Bong, MD      . oxyCODONE (Oxy IR/ROXICODONE) immediate release tablet 5 mg  5 mg Oral Q4H PRN Rodolph Bong, MD      . saccharomyces boulardii (FLORASTOR) capsule 500 mg  500 mg Oral BID Rodolph Bong, MD      . senna-docusate (Senokot-S) tablet 1 tablet  1 tablet Oral QHS PRN Rodolph Bong, MD      . simvastatin (ZOCOR) tablet 20 mg  20 mg Oral q1800 Rodolph Bong, MD      . sodium chloride 0.9 % injection 3 mL  3 mL Intravenous Q12H Rodolph Bong, MD        Allergies as of 08/07/2012 - Review Complete 08/07/2012  Allergen Reaction  Noted  . Catapres (clonidine hcl) Other (See Comments) 09/24/2011  . Covera-hs (verapamil hcl) Other (See Comments) 09/24/2011  . Other Other (See  Comments) 09/24/2011  . Sulfa antibiotics Itching 09/24/2011    History reviewed. No pertinent family history.  History   Social History  . Marital Status: Married    Spouse Name: N/A    Number of Children: N/A  . Years of Education: N/A   Occupational History  . Not on file.   Social History Main Topics  . Smoking status: Never Smoker   . Smokeless tobacco: Never Used  . Alcohol Use: No  . Drug Use: No  . Sexually Active: No   Other Topics Concern  . Not on file   Social History Narrative  . No narrative on file    Review of Systems: Negative for nausea, vomiting, or fevers with the current illness. Physical Exam: Vital signs in last 24 hours: Temp:  [98.5 F (36.9 C)-98.6 F (37 C)] 98.6 F (37 C) (12/25 1102) Pulse Rate:  [80-88] 80  (12/25 1102) Resp:  [18] 18  (12/25 1102) BP: (161-176)/(61-77) 161/77 mmHg (12/25 1102) SpO2:  [97 %] 97 % (12/25 1102) Weight:  [83.008 kg (183 lb)-83.2 kg (183 lb 6.8 oz)] 83.2 kg (183 lb 6.8 oz) (12/25 1102) Last BM Date: 08/07/12 General:   Alert,  Well-developed, well-nourished, pleasant and cooperative in NAD Head:  Normocephalic and atraumatic. Eyes:  Sclera clear, no icterus.    Lungs:  Clear throughout to auscultation.   No wheezes, crackles, or rhonchi. No evident respiratory distress. Heart:   Regular rate and rhythm; no murmurs, clicks, rubs,  or gallops. Abdomen:  Soft, nontender, and nondistended. No masses, hepatosplenomegaly or ventral hernias noted. Msk:   Symmetrical without gross deformities. Extremities:   Without clubbing or cyanosis. Neurologic:  Alert and coherent;  grossly normal neurologically. Psych:   Alert and cooperative. Normal mood and affect.  Intake/Output from previous day:   Intake/Output this shift:    Lab Results:  [facility] 08/07/12 0605   WBC 11.6*  HGB 14.1  HCT 42.8  PLT 156   BMET  Basename 08/07/12 0605  NA 143  K 3.6  CL 102  CO2 29  GLUCOSE 182*  BUN 16  CREATININE 0.80  CALCIUM 10.2   LFT No results found for this basename: PROT,ALBUMIN,AST,ALT,ALKPHOS,BILITOT,BILIDIR,IBILI in the last 72 hours PT/INR  Basename 08/07/12 0605  LABPROT 12.9  INR 0.98      Studies/Results: Ct Abdomen Pelvis W Contrast  08/07/2012  *RADIOLOGY REPORT*  Clinical Data: Pelvic pain, rectal bleeding, some abdominal pain  CT ABDOMEN AND PELVIS WITH CONTRAST  Technique:  Multidetector CT imaging of the abdomen and pelvis was performed following the standard protocol during bolus administration of intravenous contrast.  Contrast: OMNIPAQUE IOHEXOL 300 MG/ML  SOLN, 50mL OMNIPAQUE IOHEXOL 300 MG/ML  SOLN  Comparison: CT abdomen pelvis of 06/04/2011  Findings: Minimal atelectasis is noted posteriorly the right lung base.  A moderate sized hiatal hernia is again noted.  The heart is mildly enlarged and there is a tiny pericardial effusion versus pleural thickening anteriorly.  The liver enhances, with only slight prominence of the central intrahepatic ducts.  A gallstone again is noted to layer in the neck of the gallbladder.  The pancreas is normal in size and the pancreatic duct is not dilated. The adrenal glands and spleen are unremarkable.  The stomach is largely decompressed.  The kidneys enhance with no calculus or mass and a cyst is noted in the lower pole of the right kidney posterior medially.  On delayed images the pelvocaliceal systems are unremarkable.  The abdominal  aorta is normal in caliber with atheromatous change present.  No adenopathy is seen.  The urinary bladder is moderately urine distended with no abnormality noted.  The uterus appears to have been removed.  No adnexal lesion is seen.  No fluid is noted within the pelvis. Although the colon is collapsed, there is suggestion of edema of the proximal descending colon  and splenic flexure of colon extending into the transverse colon to the mid proximal transverse colon most consistent with diffuse colitis.  The right colon is unremarkable.  There is a lumbar scoliosis convex to the right and there is diffuse degenerative change throughout the lumbar spine with compression deformities of T12, L1, and L3 vertebral bodies with vertebroplasty at L3.  IMPRESSION:  1.  Edema of the proximal descending colon, splenic flexure of colon, and distal transverse colon most consistent with colitis. 2.  Moderate sized hiatal hernia.   Original Report Authenticated By: Dwyane Dee, M.D.     Impression: The overall picture is very compatible with ischemic colitis, given the fairly abrupt onset of symptoms, the presence of pain, the evolution to small-volume bloody diarrhea, and the finding of segmental colitis on CT in a classic "watershed" zone in terms of vascular distribution.  Plan: I would favor expectant management with supportive, symptomatic care.   The patient does not feel ready yet to advance beyond a clear liquid diet.   I do not think that antibiotics are necessary.   I do not think that an emergent colonoscopy is needed since the diagnosis is fairly clear on clinical and radiographic grounds, and since the patient is already rapidly improving.  We can review the patient's progress day by day and alter the above plan accordingly.  Also, the patient will need followup with her primary gastroenterologist as an outpatient, with consideration of possible elective colonoscopy since it has been more than 4 years since her last exam, although that is 8 discretionary decision, taking into account this patient's advanced age.   LOS: 0 days   Noam Karaffa V  08/07/2012, 2:23 PM

## 2012-08-07 NOTE — ED Notes (Signed)
Pt reports n/v/d yesterday after eating at [employer]. Pt states she noticed bright red blood in stool this morning. Pt also c/o diffuse abd pain.

## 2012-08-07 NOTE — Progress Notes (Signed)
Was called per ED physician at [facility] ED to admit 78 year old female with history of diabetes hypertension, prior history of diverticular bleed 09/2011 who presents to the ED with nausea vomiting and diarrhea and subsequently rectal bleeding. Patient developed abdominal pain CT of the abdomen and pelvis was done which was consistent with a colitis. Patient has been given some IV antibiotics in the ED. [facility] were called to admit the patient for further evaluation and management. Per ED physician hemoglobin is stable , vitals are stable. FOBT was positive. Patient to be admitted to [facility] to a telemetry bed.

## 2012-08-07 NOTE — ED Notes (Signed)
Patient transported to CT 

## 2012-08-07 NOTE — H&P (Signed)
[facility] History and Physical  Heidi Dominguez WUJ:[id number] DOB: 12-Mar-1921 DOA: 08/07/2012  Referring physician: Dr. Karma Ganja PCP: Ginette Otto, MD  Specialists: GI: Dr Matthias Hughs  Chief Complaint: Rectal bleed  HPI: Heidi Dominguez is a 78 y.o. female with history of hypertension, hyperlipidemia, diabetes, CHF on chronic diuretic therapy, prior history of lower GI bleed felt to be diverticular in nature in February of 2013 presented to the ED out of meds and a [place] with a one-day history of bright red blood per rectum and diarrhea. History is given by patient's daughter patient and her son who states that one day prior to admission patient had eaten at [employer] subsequently an hour later patient had some abdominal pain she tried some Gas-X with minimal improvement. Patient subsequently took some Imodium. Patient subsequently had 2-3 episodes of diarrhea one day prior to admission and per daughter had a old [employer] 2 in. Around 4 AM on the morning of admission patient was complaining of abdominal pain was asking for [employer]. Patient's daughter stated that she stood up to change her depends and noted that patient had loose stools and bloody bowel movement. Patient was subsequently transported to the emergency room. Patient and daughter deny any fever, no chills, no chest pain, no shortness of breath, no emesis, no constipation. Patient does endorse some nausea, diarrhea, generalized weakness, decreased oral intake, increased urinary frequency which is being followed as outpatient and dysuria. Patient was seen in the ED basic metabolic profile which was done was within normal limits. CBC done had a white count of 11.6 otherwise was within normal limits. CT of the abdomen and pelvis was worrisome for segmental colitis in the watershed region. Patient was given a dose of IV Rocephin and Flagyl in the ED were called to admit the patient for further evaluation and management.   Review of  Systems: The patient denies anorexia, fever, weight loss,, vision loss, decreased hearing, hoarseness, chest pain, syncope, dyspnea on exertion, peripheral edema, balance deficits, hemoptysis, abdominal pain, melena, hematochezia, severe indigestion/heartburn, hematuria, incontinence, genital sores, muscle weakness, suspicious skin lesions, transient blindness, difficulty walking, depression, unusual weight change, abnormal bleeding, enlarged lymph nodes, angioedema, and breast masses.   Past Medical History  Diagnosis Date  . Diabetes mellitus   . Hypertension   . High cholesterol   . Osteoarthritis   . Degenerative joint disease of shoulder region   . HOH (hard of hearing)   . H/O knee surgery   . H/O shoulder surgery   . Complication of anesthesia     I have a hard time waking up"  . CHF (congestive heart failure)   . Pneumonia   . GERD (gastroesophageal reflux disease)   . Chronic kidney disease     incontinence/ fAMILYN DENIES HX OF CKD   Past Surgical History  Procedure Date  . Shoulder surgery   . Knee surgery   . Back surgery   . Abdominal hysterectomy   . Cataracts     bilateral   Social History:  reports that she has never smoked. She has never used smokeless tobacco. She reports that she does not drink alcohol or use illicit drugs.  Allergies  Allergen Reactions  . Catapres (Clonidine Hcl) Other (See Comments)    Unknown.  . Covera-Hs (Verapamil Hcl) Other (See Comments)    Unknown.  . Other Other (See Comments)    Any type of narcotics Feels "loopy"  . Sulfa Antibiotics Itching    History reviewed. No pertinent  family history.   Prior to Admission medications   Medication Sig Start Date End Date Taking? Authorizing Provider  amLODipine-benazepril (LOTREL) 5-20 MG per capsule Take 1 capsule by mouth daily.    Historical Provider, MD  B Complex-C (B-COMPLEX WITH VITAMIN C) tablet Take 1-2 tablets by mouth daily.    Historical Provider, MD  Cholecalciferol  (VITAMIN D3) 5000 UNITS CAPS Take 1 capsule by mouth daily.    Historical Provider, MD  darifenacin (ENABLEX) 7.5 MG 24 hr tablet Take 7.5 mg by mouth daily.    Historical Provider, MD  Ferrous Gluconate (IRON) 240 (27 FE) MG TABS Take 1 tablet by mouth daily.    Historical Provider, MD  fish oil-omega-3 fatty acids 1000 MG capsule Take 1 g by mouth daily.    Historical Provider, MD  furosemide (LASIX) 20 MG tablet Take 1 tablet (20 mg total) by mouth daily. 02/29/12 02/28/13  Ricki Rodriguez, MD  glimepiride (AMARYL) 1 MG tablet Take 1 mg by mouth daily with breakfast.    Historical Provider, MD  labetalol (NORMODYNE) 200 MG tablet Take 200 mg by mouth 2 (two) times daily.    Historical Provider, MD  Magnesium 250 MG TABS Take 1 tablet by mouth daily.    Historical Provider, MD  potassium chloride (K-DUR) 10 MEQ tablet Take 1 tablet (10 mEq total) by mouth every morning. 02/29/12 02/28/13  Ricki Rodriguez, MD  pravastatin (PRAVACHOL) 40 MG tablet Take 40 mg by mouth daily.    Historical Provider, MD  ranitidine (ZANTAC) 150 MG tablet Take 1 tablet (150 mg total) by mouth 2 (two) times daily. 02/29/12 02/28/13  Ricki Rodriguez, MD  sitaGLIPtin (JANUVIA) 100 MG tablet Take 100 mg by mouth daily.    Historical Provider, MD  vitamin B-12 (CYANOCOBALAMIN) 500 MCG tablet Take 500 mcg by mouth daily.    Historical Provider, MD   Physical Exam: Filed Vitals:   08/07/12 0547 08/07/12 0939 08/07/12 1102  BP: 176/61 175/75 161/77  Pulse: 88 86 80  Temp: 98.5 F (36.9 C)  98.6 F (37 C)  TempSrc: Oral  Oral  Resp: 18 18 18   Height: 5\' 4"  (1.626 m)  5\' 4"  (1.626 m)  Weight: 83.008 kg (183 lb)  83.2 kg (183 lb 6.8 oz)  SpO2: 97%  97%     General:  Well-developed well-nourished in no acute cardiopulmonary distress.  Eyes: Pupils equal round reactive to light and accommodation. Extraocular movements intact.  ENT: Oropharynx is clear, no lesions, no exudates.  Neck: Supple. No lymphadenopathy. No  JVD.  Cardiovascular: Regular rate rhythm with a grade 3/6 systolic ejection murmur. No JVD. No lower extremity edema.  Respiratory: Clear to auscultation bilaterally. No wheezing, no crackles, no rhonchi  Abdomen: Soft, nontender, nondistended, positive bowel sounds.  Skin: No rashes or lesions.  Musculoskeletal: 5 out of 5 bilateral upper extremity strength. However 5 bilateral lower extremity strength.  Psychiatric: Normal mood. Normal affect. Fair insight. Fair judgment.  Neurologic: Alert and oriented x3. Cranial nerves II through XII are grossly intact. No focal deficits. Sensation is intact.  Labs on Admission:  Basic Metabolic Panel:  Lab 08/07/12 9147  NA 143  K 3.6  CL 102  CO2 29  GLUCOSE 182*  BUN 16  CREATININE 0.80  CALCIUM 10.2  MG --  PHOS --   Liver Function Tests: No results found for this basename: AST:5,ALT:5,ALKPHOS:5,BILITOT:5,PROT:5,ALBUMIN:5 in the last 168 hours No results found for this basename: LIPASE:5,AMYLASE:5 in the last 168  hours No results found for this basename: AMMONIA:5 in the last 168 hours CBC:  Lab 08/07/12 0605  WBC 11.6*  NEUTROABS 9.1*  HGB 14.1  HCT 42.8  MCV 90.5  PLT 156   Cardiac Enzymes: No results found for this basename: CKTOTAL:5,CKMB:5,CKMBINDEX:5,TROPONINI:5 in the last 168 hours  BNP (last 3 results) No results found for this basename: PROBNP:3 in the last 8760 hours CBG:  Lab 08/07/12 1118  GLUCAP 160*    Radiological Exams on Admission: Ct Abdomen Pelvis W Contrast  08/07/2012  *RADIOLOGY REPORT*  Clinical Data: Pelvic pain, rectal bleeding, some abdominal pain  CT ABDOMEN AND PELVIS WITH CONTRAST  Technique:  Multidetector CT imaging of the abdomen and pelvis was performed following the standard protocol during bolus administration of intravenous contrast.  Contrast: OMNIPAQUE IOHEXOL 300 MG/ML  SOLN, 50mL OMNIPAQUE IOHEXOL 300 MG/ML  SOLN  Comparison: CT abdomen pelvis of 06/04/2011  Findings:  Minimal atelectasis is noted posteriorly the right lung base.  A moderate sized hiatal hernia is again noted.  The heart is mildly enlarged and there is a tiny pericardial effusion versus pleural thickening anteriorly.  The liver enhances, with only slight prominence of the central intrahepatic ducts.  A gallstone again is noted to layer in the neck of the gallbladder.  The pancreas is normal in size and the pancreatic duct is not dilated. The adrenal glands and spleen are unremarkable.  The stomach is largely decompressed.  The kidneys enhance with no calculus or mass and a cyst is noted in the lower pole of the right kidney posterior medially.  On delayed images the pelvocaliceal systems are unremarkable.  The abdominal aorta is normal in caliber with atheromatous change present.  No adenopathy is seen.  The urinary bladder is moderately urine distended with no abnormality noted.  The uterus appears to have been removed.  No adnexal lesion is seen.  No fluid is noted within the pelvis. Although the colon is collapsed, there is suggestion of edema of the proximal descending colon and splenic flexure of colon extending into the transverse colon to the mid proximal transverse colon most consistent with diffuse colitis.  The right colon is unremarkable.  There is a lumbar scoliosis convex to the right and there is diffuse degenerative change throughout the lumbar spine with compression deformities of T12, L1, and L3 vertebral bodies with vertebroplasty at L3.  IMPRESSION:  1.  Edema of the proximal descending colon, splenic flexure of colon, and distal transverse colon most consistent with colitis. 2.  Moderate sized hiatal hernia.   Original Report Authenticated By: Dwyane Dee, M.D.     EKG: None   Assessment/Plan Principal Problem:  *Colitis Active Problems:  Weakness  DM (diabetes mellitus)  HTN (hypertension)  Dyslipidemia  Rectal bleeding  #1 Ischemic colitis versus infectious colitis Likely an  ischemic colitis versus less likely infectious colitis. Patient presented with abdominal pain, diarrhea, followed by rectal bleeding. CT of the abdomen and pelvis which was done showed a segmental colitis in the proximal descending colon, splenic flexure of the colon, distal transverse colon. Patient with no further bloody bowel movement since 4 AM on the morning of admission. Will check stool studies. Check a C. difficile PCR. Place on clear liquids. Will hold patient's Lasix. We'll place on gentle IV hydration. Will place on florastor. Supportive care. Monitor. C. difficile PCR is positive we'll place on Flagyl. We'll consult with GI for further evaluation and management.  #2 rectal bleeding Likely secondary to probable  ischemic colitis. Patient's hemoglobin was 14. Will monitor CBC closely. Follow. Gentle hydration. GI consultation.  #3 diabetes mellitus Will hold oral hypoglycemics. Check a hemoglobin A1c. Place on sliding scale insulin.  #4 hypertension Will hold patient's diuretics secondary to problem #1. Will continue patient's labetalol, Norvasc and then Avapro. Follow.  #5 dyslipidemia Continue statin.  #6 history of chronic CHF Stable. Patient) the euvolemic. Will hold patient's Lasix secondary to problem #1 and resume in about 2-3 days if patient improves clinically. Continue Norvasc, ACE inhibitor, labetalol. Follow.  #7 prophylaxis SCDs for DVT prophylaxis.   Code Status: Full Family Communication: Abdomen the patient, daughter and son at bedside. Disposition Plan: Admit to telemetry  Time spent: 70 mins  [facility] Pager [phone]  If 7PM-7AM, please contact night-coverage [email or web address] Password [facility] 08/07/2012, 12:24 PM

## 2012-08-07 NOTE — ED Provider Notes (Signed)
Pt signed out to me at change of shift pending CT scan.  Scan shows colitis.  Started on IV antibiotics,  Vital signs and hemoglobin are stable.  Discussed results with patient and family at bedside.  They are to be admited to [place], d/w Dr. Janee Morn, [facility]- he accepts patient for admission/transfer  Ethelda Chick, MD 08/07/12 [phone]

## 2012-08-07 NOTE — ED Notes (Signed)
Daughter states pt started with some abd pain yesterday afternoon, and pt had nausea with minimal vomiting. Daughter states pt started with diarrhea last night. Pt denies nausea now.

## 2012-08-08 DIAGNOSIS — E119 Type 2 diabetes mellitus without complications: Secondary | ICD-10-CM | POA: Diagnosis not present

## 2012-08-08 DIAGNOSIS — N390 Urinary tract infection, site not specified: Secondary | ICD-10-CM

## 2012-08-08 DIAGNOSIS — K591 Functional diarrhea: Secondary | ICD-10-CM | POA: Diagnosis not present

## 2012-08-08 DIAGNOSIS — R1033 Periumbilical pain: Secondary | ICD-10-CM | POA: Diagnosis not present

## 2012-08-08 DIAGNOSIS — K921 Melena: Secondary | ICD-10-CM | POA: Diagnosis not present

## 2012-08-08 DIAGNOSIS — K625 Hemorrhage of anus and rectum: Secondary | ICD-10-CM | POA: Diagnosis not present

## 2012-08-08 DIAGNOSIS — R933 Abnormal findings on diagnostic imaging of other parts of digestive tract: Secondary | ICD-10-CM | POA: Diagnosis not present

## 2012-08-08 DIAGNOSIS — K5289 Other specified noninfective gastroenteritis and colitis: Secondary | ICD-10-CM | POA: Diagnosis not present

## 2012-08-08 LAB — CBC
MCH: 29.6 pg (ref 26.0–34.0)
MCHC: 32.6 g/dL (ref 30.0–36.0)
MCV: 90.8 fL (ref 78.0–100.0)
Platelets: 140 10*3/uL — ABNORMAL LOW (ref 150–400)
RBC: 4.02 MIL/uL (ref 3.87–5.11)
RDW: 13.9 % (ref 11.5–15.5)

## 2012-08-08 LAB — BASIC METABOLIC PANEL
BUN: 15 mg/dL (ref 6–23)
Calcium: 9 mg/dL (ref 8.4–10.5)
Creatinine, Ser: 0.99 mg/dL (ref 0.50–1.10)
GFR calc non Af Amer: 49 mL/min — ABNORMAL LOW (ref >90)
Glucose, Bld: 107 mg/dL — ABNORMAL HIGH (ref 70–99)
Sodium: 138 mEq/L (ref 135–145)

## 2012-08-08 LAB — GLUCOSE, CAPILLARY
Glucose-Capillary: 93 mg/dL (ref 70–99)
Glucose-Capillary: 185 mg/dL — ABNORMAL HIGH (ref 70–99)
Glucose-Capillary: 93 mg/dL (ref 70–99)

## 2012-08-08 LAB — HEMOGLOBIN A1C: Hgb A1c MFr Bld: 5.9 % — ABNORMAL HIGH (ref <5.7)

## 2012-08-08 LAB — CLOSTRIDIUM DIFFICILE BY PCR: Toxigenic C. Difficile by PCR: NEGATIVE

## 2012-08-08 MED ORDER — SODIUM CHLORIDE 0.9 % IV SOLN
INTRAVENOUS | Status: DC
  Administered 2012-08-08: 50 mL/h via INTRAVENOUS

## 2012-08-08 MED ORDER — DEXTROSE 5 % IV SOLN
1.0000 g | INTRAVENOUS | Status: DC
  Administered 2012-08-08 – 2012-08-09 (×2): 1 g via INTRAVENOUS
  Filled 2012-08-08 (×2): qty 10

## 2012-08-08 NOTE — Evaluation (Signed)
Occupational Therapy Evaluation Patient Details Name: Heidi Dominguez MRN: [id number] DOB: Dec 28, 1920 Today's Date: 08/08/2012 Time: 4098-1191 OT Time Calculation (min): 24 min  OT Assessment / Plan / Recommendation Clinical Impression  Pt admitted with colitis.  Will benefit from acute OT services to address below problem list. Recommending ST SNF to further maximize independence and safety before eventual return home.    OT Assessment  Patient needs continued OT Services    Follow Up Recommendations  SNF    Barriers to Discharge Decreased caregiver support Pt does not have 24/7 assist at home.  Equipment Recommendations  None recommended by OT    Recommendations for Other Services    Frequency  Min 2X/week    Precautions / Restrictions Precautions Precautions: Fall Precaution Comments: history of falls, last fall 2 mo ago Restrictions Weight Bearing Restrictions: No   Pertinent Vitals/Pain See vita    ADL  Lower Body Dressing: Performed;Minimal assistance Where Assessed - Lower Body Dressing: Supported sit to stand Toilet Transfer: Performed;Minimal assistance Toilet Transfer Method: Sit to Barista: Bedside commode Toileting - Clothing Manipulation and Hygiene: Performed;Moderate assistance Where Assessed - Toileting Clothing Manipulation and Hygiene: Sit to stand from 3-in-1 or toilet Equipment Used: Gait belt;Rolling walker Transfers/Ambulation Related to ADLs: min guard with RW for ambulation in room ADL Comments: Min assist to thread LEs through undergarment (pt states she uses a reacher at home for LB dressing).  Assist for pulling undergarment up/down over hips and for back peri care due to pt needing to hold on to RW for balance.    OT Diagnosis: Generalized weakness  OT Problem List: Decreased activity tolerance;Decreased strength;Impaired balance (sitting and/or standing);Decreased knowledge of use of DME or AE OT Treatment  Interventions: Self-care/ADL training;DME and/or AE instruction;Therapeutic activities;Patient/family education;Balance training   OT Goals Acute Rehab OT Goals OT Goal Formulation: With patient Time For Goal Achievement: 08/15/12 Potential to Achieve Goals: Good ADL Goals Pt Will Perform Grooming: with modified independence;Standing at sink ADL Goal: Grooming - Progress: Goal set today Pt Will Transfer to Toilet: with modified independence;Ambulation;Comfort height toilet;with DME ADL Goal: Toilet Transfer - Progress: Goal set today Pt Will Perform Toileting - Clothing Manipulation: with modified independence;Sitting on 3-in-1 or toilet;Standing ADL Goal: Toileting - Clothing Manipulation - Progress: Goal set today Pt Will Perform Toileting - Hygiene: with modified independence;Sit to stand from 3-in-1/toilet ADL Goal: Toileting - Hygiene - Progress: Goal set today Miscellaneous OT Goals Miscellaneous OT Goal #1: Pt will perform all functional mobility at mod I level during ADLs. OT Goal: Miscellaneous Goal #1 - Progress: Goal set today  Visit Information  Last OT Received On: 08/08/12 Assistance Needed: +1    Subjective Data      Prior Functioning     Home Living Lives With: Alone Available Help at Discharge: Friend(s);Available PRN/intermittently;Personal care attendant Bathroom Shower/Tub: Tub/shower unit Bathroom Toilet: Standard Home Adaptive Equipment: Bedside commode/3-in-1;Reacher;Tub transfer bench (rollator) Prior Function Level of Independence: Needs assistance Needs Assistance: Meal Prep;Light Housekeeping Meal Prep:  (meals on wheels) Light Housekeeping: Maximal Comments: Pt states she has someone come in and assist with housekeeping and tub transfers.         Vision/Perception     Cognition  Overall Cognitive Status: Appears within functional limits for tasks assessed/performed Arousal/Alertness: Awake/alert Orientation Level: Appears intact for  tasks assessed Behavior During Session: [facility] for tasks performed    Extremity/Trunk Assessment Right Upper Extremity Assessment RUE ROM/Strength/Tone: [facility] for tasks assessed Left Upper Extremity Assessment  LUE ROM/Strength/Tone: [facility] for tasks assessed     Mobility Bed Mobility Bed Mobility: Supine to Sit;Sitting - Scoot to Edge of Bed Supine to Sit: 5: Supervision;HOB flat Sitting - Scoot to Edge of Bed: 5: Supervision Details for Bed Mobility Assistance: increased effort, minimal v/c's for ease of transition Transfers Transfers: Sit to Stand;Stand to Sit Sit to Stand: 4: Min assist;From elevated surface;From bed;From chair/3-in-1;With upper extremity assist;With armrests Stand to Sit: With upper extremity assist;With armrests;To bed;To chair/3-in-1;To elevated surface Details for Transfer Assistance: needs elevated surface for minA, pulls on RW to stand (has rollator that locks at home); minA for follow through and stability; min tactile stability assist as well as sequencing cues for transfer to 3in1     Shoulder Instructions     Exercise     Balance Balance Balance Assessed: Yes Static Standing Balance Static Standing - Balance Support: Bilateral upper extremity supported Static Standing - Level of Assistance: 5: Stand by assistance Static Standing - Comment/# of Minutes: able to take one arm off the RW to try to pull up her brief and needed minA for stability, otherwise mingaurdA for static standing   End of Session OT - End of Session Equipment Utilized During Treatment: Gait belt Activity Tolerance: Patient tolerated treatment well Patient left: in chair;with call bell/phone within reach Nurse Communication: Mobility status  GO    08/08/2012 Cipriano Mile OTR/L Pager [phone] Office [phone]  Cipriano Mile 08/08/2012, 12:48 PM

## 2012-08-08 NOTE — Evaluation (Signed)
Physical Therapy Evaluation Patient Details Name: Heidi Dominguez MRN: [id number] DOB: [birth year] Today's Date: 08/08/2012 Time: 4098-1191 PT Time Calculation (min): 28 min  PT Assessment / Plan / Recommendation Clinical Impression  [name] is 78 y/o female admitted with colitis. Presents to PT today with generalized weakness and decreased activity tolerance limiting her functional independence. and mobility. Will benefit physical therapy in the acute setting to maximize mobility and safey for d/c. Because of her limited support at home I feel safest d/c plan is ST-SNF. Pt would like for [place] to call her daughter to discuss our recommendations.     PT Assessment  Patient needs continued PT services    Follow Up Recommendations  SNF    Does the patient have the potential to tolerate intense rehabilitation      Barriers to Discharge Decreased caregiver support      Equipment Recommendations  None recommended by PT    Recommendations for Other Services     Frequency Min 3X/week    Precautions / Restrictions Precautions Precautions: Fall Precaution Comments: history of falls, last fall 2 mo ago Restrictions Weight Bearing Restrictions: No     Mobility  Bed Mobility Bed Mobility: Supine to Sit Supine to Sit: HOB flat;5: Supervision Details for Bed Mobility Assistance: increased effort, minimal v/c's for ease of transition Transfers Transfers: Sit to Stand;Stand to Sit;Stand Pivot Transfers Sit to Stand: 4: Min assist;From elevated surface;From bed;From chair/3-in-1;With upper extremity assist;With armrests Stand to Sit: With upper extremity assist;With armrests;To bed;To chair/3-in-1;To elevated surface Stand Pivot Transfers: 4: Min assist Details for Transfer Assistance: needs elevated surface for minA, pulls on RW to stand (has rollator that locks at home); minA for follow through and stability; min tactile stability assist as well as sequencing cues for transfer to  3in1 Ambulation/Gait Ambulation/Gait Assistance: 4: Min guard Ambulation Distance (Feet): 15 Feet Assistive device: Rolling walker Ambulation/Gait Assistance Details: cues for tall posture  Gait Pattern: Trunk flexed;Shuffle              PT Diagnosis: Difficulty walking;Abnormality of gait;Generalized weakness  PT Problem List: Decreased strength;Decreased activity tolerance;Decreased balance;Decreased mobility PT Treatment Interventions: DME instruction;Gait training;Functional mobility training;Therapeutic activities;Therapeutic exercise;Balance training;Patient/family education   PT Goals Acute Rehab PT Goals PT Goal Formulation: With patient Time For Goal Achievement: 08/15/12 Potential to Achieve Goals: Good Pt will go Supine/Side to Sit: with modified independence PT Goal: Supine/Side to Sit - Progress: Goal set today Pt will go Sit to Supine/Side: with modified independence PT Goal: Sit to Supine/Side - Progress: Goal set today Pt will go Sit to Stand: with modified independence PT Goal: Sit to Stand - Progress: Goal set today Pt will go Stand to Sit: with modified independence PT Goal: Stand to Sit - Progress: Goal set today Pt will Transfer Bed to Chair/Chair to Bed: with modified independence PT Transfer Goal: Bed to Chair/Chair to Bed - Progress: Goal set today Pt will Ambulate: 51 - 150 feet;with modified independence;with least restrictive assistive device PT Goal: Ambulate - Progress: Goal set today Pt will Perform Home Exercise Program: Independently PT Goal: Perform Home Exercise Program - Progress: Goal set today  Visit Information  Last PT Received On: 08/08/12 Assistance Needed: +1 PT/OT Co-Evaluation/Treatment: Yes    Subjective Data  Subjective: I get meals on wheels.    Prior Functioning       Cognition  Overall Cognitive Status: Appears within functional limits for tasks assessed/performed Arousal/Alertness: Awake/alert Orientation Level:  Appears intact for tasks assessed  Behavior During Session: [facility] for tasks performed    Extremity/Trunk Assessment     Balance Balance Balance Assessed: Yes Static Standing Balance Static Standing - Balance Support: Bilateral upper extremity supported Static Standing - Level of Assistance: 5: Stand by assistance Static Standing - Comment/# of Minutes: able to take one arm off the RW to try to pull up her brief and needed minA for stability, otherwise mingaurdA for static standing  End of Session PT - End of Session Equipment Utilized During Treatment: Gait belt Activity Tolerance: Patient tolerated treatment well;Patient limited by fatigue Patient left: in chair;with call bell/phone within reach Nurse Communication: Mobility status  GP     [facility] HELEN 08/08/2012, 11:36 AM

## 2012-08-08 NOTE — Progress Notes (Signed)
[facility] PROGRESS NOTE  Heidi Dominguez OVF:[id number] DOB: [birth year] DOA: 08/07/2012 PCP: Ginette Otto, MD  Assessment/Plan:  #1 ischemic colitis Some clinical improvement. Patient's diuretics have been held. C. difficile PCR is negative. Stool studies are pending. Continue to hold diuretics. H&H is stable. Decrease IV fluids to 50 cc per hour. Continue clear liquid diet. Follow. GI is following and appreciate input and recommendations.  #2 rectal bleeding Secondary to problem #1. Patient's hemoglobin is about 11.9 today. Patient with maroon stool this morning. Gentle hydration. Follow. GI is following  #3 well controlled diabetes mellitus type 2 Hemoglobin A1c is 5.9. CBGs have ranged from 107-182. Continue to hold oral hypoglycemic agents. Sliding scale insulin.  #4 hypertension Continue labetalol Norvasc and Avapro. Hold diuretics secondary to problem #1.  #5 dyslipidemia Continue statin  #6 history of chronic CHF Stable compensated. Patient's diuretics on hold secondary to problem #1. Decrease IV fluids to 50 cc per hour. Continue Norvasc ACE inhibitor labetalol. Follow.  #7 probable UTI Urine cultures are pending. Continue IV Rocephin.  #8 prophylaxis SCDs for DVT prophylaxis.   Code Status: Full Family Communication: Updated patient. No family at bedside. Disposition Plan: Home with home health assessment  Vs SNF   Consultants:  Gastroenterology: Dr. Matthias Hughs  03/07/2012  Procedures:  None  Antibiotics:  IV Rocephin 08/08/2012  HPI/Subjective: Patient states BM a little more formed. Patient states was told she had a little blood in stool today. Patient states cold liquid causes a little abdominal pain which quickly resolves. Patient with maroon colored stools and commode.  Objective: Filed Vitals:   08/07/12 0939 08/07/12 1102 08/07/12 2131 08/08/12 0647  BP: 175/75 161/77 121/83 142/96  Pulse: 86 80 72 73  Temp:  98.6 F (37 C) 97.9  F (36.6 C) 98.8 F (37.1 C)  TempSrc:  Oral Oral Oral  Resp: 18 18 19 18   Height:  5\' 4"  (1.626 m)    Weight:  83.2 kg (183 lb 6.8 oz)  84.1 kg (185 lb 6.5 oz)  SpO2:  97% 91% 100%    Intake/Output Summary (Last 24 hours) at 08/08/12 1311 Last data filed at 08/08/12 0635  Gross per 24 hour  Intake 981.25 ml  Output      0 ml  Net 981.25 ml   Filed Weights   08/07/12 0547 08/07/12 1102 08/08/12 0647  Weight: 83.008 kg (183 lb) 83.2 kg (183 lb 6.8 oz) 84.1 kg (185 lb 6.5 oz)    Exam:   General:  NAD  Cardiovascular: RRR with 3/6 SEM  Respiratory: Bibasilar crackles.  Abdomen: Soft/NT/ND/+BS  Data Reviewed: Basic Metabolic Panel:  Lab 08/08/12 8416 08/07/12 1545 08/07/12 0605  NA 138 -- 143  K 4.1 -- 3.6  CL 100 -- 102  CO2 30 -- 29  GLUCOSE 107* -- 182*  BUN 15 -- 16  CREATININE 0.99 -- 0.80  CALCIUM 9.0 -- 10.2  MG -- 1.7 --  PHOS -- -- --   Liver Function Tests: No results found for this basename: AST:5,ALT:5,ALKPHOS:5,BILITOT:5,PROT:5,ALBUMIN:5 in the last 168 hours No results found for this basename: LIPASE:5,AMYLASE:5 in the last 168 hours No results found for this basename: AMMONIA:5 in the last 168 hours CBC:  Lab 08/08/12 0455 08/07/12 1545 08/07/12 0605  WBC 11.5* 8.3 11.6*  NEUTROABS -- -- 9.1*  HGB 11.9* 12.2 14.1  HCT 36.5 38.1 42.8  MCV 90.8 90.9 90.5  PLT 140* 147* 156   Cardiac Enzymes: No results found for this basename: CKTOTAL:5,CKMB:5,CKMBINDEX:5,TROPONINI:5  in the last 168 hours BNP (last 3 results) No results found for this basename: PROBNP:3 in the last 8760 hours CBG:  Lab 08/08/12 1106 08/08/12 0557 08/07/12 2155 08/07/12 2109 08/07/12 1630  GLUCAP 185* 93 105* 59* 201*    No results found for this or any previous visit (from the past 240 hour(s)).   Studies: Ct Abdomen Pelvis W Contrast  08/07/2012  *RADIOLOGY REPORT*  Clinical Data: Pelvic pain, rectal bleeding, some abdominal pain  CT ABDOMEN AND PELVIS WITH  CONTRAST  Technique:  Multidetector CT imaging of the abdomen and pelvis was performed following the standard protocol during bolus administration of intravenous contrast.  Contrast: OMNIPAQUE IOHEXOL 300 MG/ML  SOLN, 50mL OMNIPAQUE IOHEXOL 300 MG/ML  SOLN  Comparison: CT abdomen pelvis of 06/04/2011  Findings: Minimal atelectasis is noted posteriorly the right lung base.  A moderate sized hiatal hernia is again noted.  The heart is mildly enlarged and there is a tiny pericardial effusion versus pleural thickening anteriorly.  The liver enhances, with only slight prominence of the central intrahepatic ducts.  A gallstone again is noted to layer in the neck of the gallbladder.  The pancreas is normal in size and the pancreatic duct is not dilated. The adrenal glands and spleen are unremarkable.  The stomach is largely decompressed.  The kidneys enhance with no calculus or mass and a cyst is noted in the lower pole of the right kidney posterior medially.  On delayed images the pelvocaliceal systems are unremarkable.  The abdominal aorta is normal in caliber with atheromatous change present.  No adenopathy is seen.  The urinary bladder is moderately urine distended with no abnormality noted.  The uterus appears to have been removed.  No adnexal lesion is seen.  No fluid is noted within the pelvis. Although the colon is collapsed, there is suggestion of edema of the proximal descending colon and splenic flexure of colon extending into the transverse colon to the mid proximal transverse colon most consistent with diffuse colitis.  The right colon is unremarkable.  There is a lumbar scoliosis convex to the right and there is diffuse degenerative change throughout the lumbar spine with compression deformities of T12, L1, and L3 vertebral bodies with vertebroplasty at L3.  IMPRESSION:  1.  Edema of the proximal descending colon, splenic flexure of colon, and distal transverse colon most consistent with colitis. 2.   Moderate sized hiatal hernia.   Original Report Authenticated By: Dwyane Dee, M.D.     Scheduled Meds:   . amLODipine  5 mg Oral Daily  . B-complex with vitamin C  1-2 tablet Oral Daily  . benazepril  20 mg Oral Daily  . cefTRIAXone (ROCEPHIN)  IV  1 g Intravenous Q24H  . vitamin B-12  500 mcg Oral Daily  . darifenacin  7.5 mg Oral Daily  . ferrous gluconate  324 mg Oral Q breakfast  . insulin aspart  0-9 Units Subcutaneous TID WC  . labetalol  200 mg Oral BID  . magnesium oxide  200 mg Oral Daily  . omega-3 acid ethyl esters  1 g Oral Daily  . saccharomyces boulardii  500 mg Oral BID  . simvastatin  20 mg Oral q1800  . sodium chloride  3 mL Intravenous Q12H   Continuous Infusions:   . sodium chloride 75 mL/hr (08/08/12 1304)    Principal Problem:  *Colitis Active Problems:  Weakness  DM (diabetes mellitus)  HTN (hypertension)  Dyslipidemia  Rectal bleeding  Time spent: > 35 mins    [facility]  [facility] Pager [phone]. If 8PM-8AM, please contact night-coverage at [email or web address], password [email or web address] 08/08/2012, 1:11 PM  LOS: 1 day

## 2012-08-08 NOTE — Progress Notes (Signed)
Utilization Review Completed.   Jasmyne Lodato, RN, BSN Nurse Case Manager  [phone]  

## 2012-08-08 NOTE — Progress Notes (Signed)
Continued improvement. One loose bowel movement today. Abdominal pain shows further improvement.  Labs are stable, with stable hemoglobin, and steady white count.  Pros and cons of colonoscopic evaluation discussed with patient and daughter. In my opinion, this 78 year old patient, who has classic findings for ischemic colitis and is getting progressively better, his best left uncolonoscoped, because the risk of the procedure but probably outweigh the benefit. They understand and agree.  Will advance diet.  Heidi Dominguez, M.D. [phone]

## 2012-08-09 ENCOUNTER — Inpatient Hospital Stay (HOSPITAL_COMMUNITY): Payer: Medicare Other

## 2012-08-09 DIAGNOSIS — R062 Wheezing: Secondary | ICD-10-CM | POA: Diagnosis not present

## 2012-08-09 DIAGNOSIS — K5289 Other specified noninfective gastroenteritis and colitis: Secondary | ICD-10-CM | POA: Diagnosis not present

## 2012-08-09 DIAGNOSIS — K559 Vascular disorder of intestine, unspecified: Secondary | ICD-10-CM | POA: Diagnosis present

## 2012-08-09 DIAGNOSIS — I10 Essential (primary) hypertension: Secondary | ICD-10-CM | POA: Diagnosis not present

## 2012-08-09 DIAGNOSIS — N390 Urinary tract infection, site not specified: Secondary | ICD-10-CM | POA: Diagnosis not present

## 2012-08-09 DIAGNOSIS — E119 Type 2 diabetes mellitus without complications: Secondary | ICD-10-CM | POA: Diagnosis not present

## 2012-08-09 LAB — OVA AND PARASITE EXAMINATION

## 2012-08-09 LAB — BASIC METABOLIC PANEL
CO2: 27 mEq/L (ref 19–32)
Glucose, Bld: 114 mg/dL — ABNORMAL HIGH (ref 70–99)
Potassium: 4 mEq/L (ref 3.5–5.1)
Sodium: 140 mEq/L (ref 135–145)

## 2012-08-09 LAB — CBC
Hemoglobin: 12.3 g/dL (ref 12.0–15.0)
RBC: 4.18 MIL/uL (ref 3.87–5.11)
WBC: 10.3 10*3/uL (ref 4.0–10.5)

## 2012-08-09 LAB — GLUCOSE, CAPILLARY
Glucose-Capillary: 105 mg/dL — ABNORMAL HIGH (ref 70–99)
Glucose-Capillary: 192 mg/dL — ABNORMAL HIGH (ref 70–99)

## 2012-08-09 MED ORDER — CEFUROXIME AXETIL 250 MG PO TABS
250.0000 mg | ORAL_TABLET | Freq: Two times a day (BID) | ORAL | Status: DC
  Administered 2012-08-09 – 2012-08-12 (×7): 250 mg via ORAL
  Filled 2012-08-09 (×8): qty 1

## 2012-08-09 NOTE — Progress Notes (Signed)
Physical Therapy Treatment Patient Details Name: Heidi Dominguez MRN: [id number] DOB: 11-27-1920 Today's Date: 08/09/2012 Time: 4098-1191 PT Time Calculation (min): 32 min  PT Assessment / Plan / Recommendation Comments on Treatment Session  Making great progress today. Activity tolerance improved but still demonstrating safety concerns with mobility and balance. Continue to recommend ST-SNF prior to d/c home given family cannot provide 24/7 assist. Spoke with daughter who is agreeable.     Follow Up Recommendations  SNF     Does the patient have the potential to tolerate intense rehabilitation     Barriers to Discharge        Equipment Recommendations  None recommended by PT    Recommendations for Other Services    Frequency Min 3X/week   Plan Discharge plan remains appropriate;Frequency remains appropriate    Precautions / Restrictions Precautions Precautions: Fall       Mobility  Bed Mobility Bed Mobility: Supine to Sit Supine to Sit: HOB flat;6: Modified independent (Device/Increase time) Sitting - Scoot to Edge of Bed: 5: Supervision Transfers Transfers: Sit to Stand;Stand to [employer] Transfers Sit to Stand: 4: Min guard;With upper extremity assist;From bed;From chair/3-in-1 Stand to Sit: 4: Min guard;With upper extremity assist;To chair/3-in-1 Stand Pivot Transfers: 4: Min assist Details for Transfer Assistance: cues for safe hand placement and gaurding for stability, min tactile assist for safety during SPT (pt reaching for 3in1 too early) Ambulation/Gait Ambulation/Gait Assistance: 4: Min guard Ambulation Distance (Feet): 300 Feet (150 x2) Assistive device: Rolling walker Ambulation/Gait Assistance Details: cues for tall posture and safe positioning within the RW especially when turning Gait Pattern: Trunk flexed    Exercises General Exercises - Lower Extremity Long Arc Quad: AROM;Both;20 reps;Seated Hip Flexion/Marching: AROM;Both;20 reps;Seated Toe  Raises: AROM;Both;Other reps (comment);Seated Heel Raises: AROM;Both;Other reps (comment);Seated (30 reps)     PT Goals Acute Rehab PT Goals PT Goal: Supine/Side to Sit - Progress: Met PT Goal: Sit to Stand - Progress: Progressing toward goal PT Goal: Stand to Sit - Progress: Progressing toward goal PT Transfer Goal: Bed to Chair/Chair to Bed - Progress: Progressing toward goal PT Goal: Ambulate - Progress: Progressing toward goal PT Goal: Perform Home Exercise Program - Progress: Progressing toward goal  Visit Information  Last PT Received On: 08/09/12 Assistance Needed: +1    Subjective Data  Subjective: Will you call my daughter?   Cognition  Overall Cognitive Status: Appears within functional limits for tasks assessed/performed Arousal/Alertness: Awake/alert Orientation Level: Appears intact for tasks assessed Behavior During Session: [facility] for tasks performed    Balance  Static Standing Balance Static Standing - Balance Support: Bilateral upper extremity supported Static Standing - Level of Assistance: 5: Stand by assistance Static Standing - Comment/# of Minutes: stood for 1 minute with upper extremity supported on RW while therapist performed pericare  End of Session PT - End of Session Equipment Utilized During Treatment: Gait belt Activity Tolerance: Patient tolerated treatment well Patient left: in chair Nurse Communication: Mobility status   GP     [facility] HELEN 08/09/2012, 10:47 AM

## 2012-08-09 NOTE — Clinical Social Work Note (Addendum)
Clinical Social Work Department CLINICAL SOCIAL WORK PLACEMENT NOTE 08/09/2012  Patient:  Heidi Dominguez, Heidi Dominguez  Account Number:  [id number] Admit date:  08/07/2012  Clinical Social Worker:  Johnsie Cancel  Date/time:  08/09/2012 01:26 PM  Clinical Social Work is seeking post-discharge placement for this patient at the following level of care:   SKILLED NURSING   (*CSW will update this form in Epic as items are completed)   08/09/2012  Patient/family provided with [facility] Department of Clinical Social Work's list of facilities offering this level of care within the geographic area requested by the patient (or if unable, by the patient's family).  08/09/2012  Patient/family informed of their freedom to choose among providers that offer the needed level of care, that participate in Medicare, Medicaid or managed care program needed by the patient, have an available bed and are willing to accept the patient.  08/09/2012  Patient/family informed of [facility]' ownership interest in [facility], as well as of the fact that they are under no obligation to receive care at this facility.  PASARR submitted to EDS on 10/20/2006 PASARR number received from EDS on 10/20/2006  FL2 transmitted to all facilities in geographic area requested by pt/family on  08/09/2012 FL2 transmitted to all facilities within larger geographic area on   Patient informed that his/her managed care company has contracts with or will negotiate with  certain facilities, including the following:     Patient/family informed of bed offers received:  08/09/2012 Patient chooses bed at [facility] Physician recommends and patient chooses bed at    Patient to be transferred to [facility]  on 08/12/12  Patient to be transferred to facility by Ambulance Sharin Mons)  The following physician request were entered in Epic:   Additional Comments:  [name], LCSWA [facility] Clinical  Social Worker Contact #: [phone] (PRN) 08/12/12  Patient and daughter are pleased with d/c plan. Notified SNF and pt's nurse- Courtney of d/c to SNF.  Lorri Frederick. [place]  [phone]

## 2012-08-09 NOTE — Progress Notes (Signed)
[facility] Progress Note  Subjective: The patient states she is not having any abdominal pain and stools are more formed without any visible blood. She had a bowel movement saved in the toilet but it has been flushed  Objective: Vital signs in last 24 hours: Temp:  [98.8 F (37.1 C)-99.1 F (37.3 C)] 98.8 F (37.1 C) (12/27 0457) Pulse Rate:  [66-72] 70  (12/27 0457) Resp:  [20] 20  (12/27 0457) BP: (127-132)/(45-53) 127/45 mmHg (12/27 0457) SpO2:  [95 %-98 %] 95 % (12/27 0457) Weight:  [83.553 kg (184 lb 3.2 oz)] 83.553 kg (184 lb 3.2 oz) (12/27 0457) Weight change: 0.353 kg (12.4 oz)   PE: abdomen soft, nontender  Lab Results: Results for orders placed during the hospital encounter of 08/07/12 (from the past 24 hour(s))  GLUCOSE, CAPILLARY     Status: Abnormal   Collection Time   08/08/12 11:06 AM      Component Value Range   Glucose-Capillary 185 (*) 70 - 99 mg/dL   Comment 1 Notify RN    OVA AND PARASITE EXAMINATION     Status: Normal   Collection Time   08/08/12 12:18 PM      Component Value Range   Specimen Description STOOL     Special Requests NONE     Ova and parasites NO OVA OR PARASITES SEEN     Report Status 08/09/2012 FINAL    CLOSTRIDIUM DIFFICILE BY PCR     Status: Normal   Collection Time   08/08/12 12:18 PM      Component Value Range   C difficile by pcr NEGATIVE  NEGATIVE  GLUCOSE, CAPILLARY     Status: Normal   Collection Time   08/08/12  4:13 PM      Component Value Range   Glucose-Capillary 93  70 - 99 mg/dL  GLUCOSE, CAPILLARY     Status: Abnormal   Collection Time   08/08/12  8:42 PM      Component Value Range   Glucose-Capillary 187 (*) 70 - 99 mg/dL  CBC     Status: Abnormal   Collection Time   08/09/12  4:50 AM      Component Value Range   WBC 10.3  4.0 - 10.5 K/uL   RBC 4.18  3.87 - 5.11 MIL/uL   Hemoglobin 12.3  12.0 - 15.0 g/dL   HCT 57.8  46.9 - 62.9 %   MCV 90.9  78.0 - 100.0 fL   MCH 29.4  26.0 - 34.0 pg   MCHC  32.4  30.0 - 36.0 g/dL   RDW 52.8  41.3 - 24.4 %   Platelets 141 (*) 150 - 400 K/uL  BASIC METABOLIC PANEL     Status: Abnormal   Collection Time   08/09/12  4:50 AM      Component Value Range   Sodium 140  135 - 145 mEq/L   Potassium 4.0  3.5 - 5.1 mEq/L   Chloride 105  96 - 112 mEq/L   CO2 27  19 - 32 mEq/L   Glucose, Bld 114 (*) 70 - 99 mg/dL   BUN 13  6 - 23 mg/dL   Creatinine, Ser 0.10  0.50 - 1.10 mg/dL   Calcium 9.1  8.4 - 27.2 mg/dL   GFR calc non Af Amer 72 (*) >90 mL/min   GFR calc Af Amer 83 (*) >90 mL/min    Studies/Results: Dg Chest Port 1 View  08/09/2012  *RADIOLOGY REPORT*  Clinical Data:  Wheezing  PORTABLE CHEST - 1 VIEW  Comparison: 02/28/2012  Findings: Bilateral shoulder arthroplasty hardware partially visualized.  Lungs clear.  Heart size upper limits normal. Tortuous atheromatous aorta.  No effusion.  IMPRESSION:  1.  Stable appearance since previous exam   Original Report Authenticated By: D. Andria Rhein, MD       Assessment: Acute segmental colitis most consistent with ischemia, stool culture is negative  Plan: Expectant management and gradual advancement of diet. We'll go ahead and start her on empiric Cipro. I will follow up in the office and decide whether he would be worthwhile to pursue colonoscopy but as far she is doing well I would probably defer.    Tambi Thole C 08/09/2012, 10:35 AM

## 2012-08-09 NOTE — Progress Notes (Signed)
Upon arrival to room, patient with auditory expiratory wheezes.  Current O2 sat 100% on Room Air.  Respiratory called to assess patient.  Claiborne Billings notified and [facility] decreased to [facility] per MD order. RN will continue to monitor. Louretta Parma, RN

## 2012-08-09 NOTE — Clinical Social Work Psych Note (Signed)
Clinical Social Work Department BRIEF PSYCHOSOCIAL ASSESSMENT 08/09/2012  Patient:  Heidi Dominguez, Heidi Dominguez     Account Number:  [id number]     Admit date:  08/07/2012  Clinical Social Worker:  Johnsie Cancel  Date/Time:  08/09/2012 12:05 PM  Referred by:  Physician  Date Referred:  08/08/2012 Referred for  SNF Placement   Other Referral:   Interview type:  Family Other interview type:    PSYCHOSOCIAL DATA Living Status:  ALONE Primary support name:  Heidi Dominguez [phone]) Primary support relationship to patient:  CHILD, ADULT Degree of support available:   Adequate.    CURRENT CONCERNS Current Concerns  Post-Acute Placement   Other Concerns:    SOCIAL WORK ASSESSMENT / PLAN CSW consulted by MD re: SNF placement. CSW completed chart review and due to AMS CSW contacted the patient's daughter, Heidi Dominguez [phone]). CSW provided support and education around SNF placement to the daughter. Daughter requested the CSW contact [facility], [facility] and [address] as her top choices prior to a whole county search. Daughter also requested/provided permission to leave a detailed message on her home and cell phone voice mail. CSW will continue to follow patient.   Assessment/plan status:  Information/Referral to [facility] Other assessment/ plan:   Information/referral to community resources:    PATIENT'S/FAMILY'S RESPONSE TO PLAN OF CARE: Daughter thanked CSW for providing support and facilitating d/c.   [name], LCSWA [facility] Clinical Social Worker Contact #: [phone] (PRN)

## 2012-08-09 NOTE — Progress Notes (Signed)
[facility] PROGRESS NOTE  PALESTINE MOSCO ZOX:[id number] DOB: [birth year] DOA: 08/07/2012 PCP: Ginette Otto, MD  Assessment/Plan:  #1 ischemic colitis Some clinical improvement. Patient's diuretics have been held. C. difficile PCR is negative. Stool studies are pending. Continue to hold diuretics. H&H is stable. NSL IVF. Patient's diet has been advanced and patient is currently tolerating a heart healthy diet. GI is following and appreciate input and recommendations.  #2 rectal bleeding Secondary to problem #1. Patient's hemoglobin is about 12.3 today. Patient with no noted bloody stools today. Saline lock  IV fluids. Supportive care. GI is following  #3 well controlled diabetes mellitus type 2 Hemoglobin A1c is 5.9. CBGs have ranged from 105-197. Continue to hold oral hypoglycemic agents. Sliding scale insulin.  #4 hypertension Continue labetalol , Norvasc and Avapro. Hold diuretics secondary to problem #1.  #5 dyslipidemia Continue statin  #6 history of chronic CHF Stable compensated. Patient's diuretics on hold secondary to problem #1. Saline lock IV fluids. Continue Norvasc ACE inhibitor labetalol. Follow.  #7 probable UTI Urine cultures are pending. Change IV Rocephin to oral Ceftin.  #8 prophylaxis SCDs for DVT prophylaxis.   Code Status: Full Family Communication: Updated patient. No family at bedside. Disposition Plan:  SNF   Consultants:  Gastroenterology: Dr. Matthias Hughs  03/07/2012  Procedures:  None  Antibiotics:  IV Rocephin 08/08/2012 >>>08/09/2012  Oral Ceftin 08/09/2012  HPI/Subjective: Patient states BM a little more formed. Patient states no blood noted in her stools. Patient tolerating current diet. Patient denies any abdominal pain.  Objective: Filed Vitals:   08/08/12 0647 08/08/12 1413 08/08/12 2045 08/09/12 0457  BP: 142/96 127/48 132/53 127/45  Pulse: 73 66 72 70  Temp: 98.8 F (37.1 C) 98.8 F (37.1 C) 99.1 F (37.3 C)  98.8 F (37.1 C)  TempSrc: Oral Oral Oral Oral  Resp: 18 20 20 20   Height:      Weight: 84.1 kg (185 lb 6.5 oz)   83.553 kg (184 lb 3.2 oz)  SpO2: 100% 98% 95% 95%    Intake/Output Summary (Last 24 hours) at 08/09/12 1255 Last data filed at 08/09/12 1254  Gross per 24 hour  Intake 1705.99 ml  Output    201 ml  Net 1504.99 ml   Filed Weights   08/07/12 1102 08/08/12 0647 08/09/12 0457  Weight: 83.2 kg (183 lb 6.8 oz) 84.1 kg (185 lb 6.5 oz) 83.553 kg (184 lb 3.2 oz)    Exam:   General:  NAD  Cardiovascular: RRR with 3/6 SEM. No LE edema.  Respiratory: Bibasilar crackles.  Abdomen: Soft/NT/ND/+BS  Data Reviewed: Basic Metabolic Panel:  Lab 08/09/12 8119 08/08/12 0455 08/07/12 1545 08/07/12 0605  NA 140 138 -- 143  K 4.0 4.1 -- 3.6  CL 105 100 -- 102  CO2 27 30 -- 29  GLUCOSE 114* 107* -- 182*  BUN 13 15 -- 16  CREATININE 0.79 0.99 -- 0.80  CALCIUM 9.1 9.0 -- 10.2  MG -- -- 1.7 --  PHOS -- -- -- --   Liver Function Tests: No results found for this basename: AST:5,ALT:5,ALKPHOS:5,BILITOT:5,PROT:5,ALBUMIN:5 in the last 168 hours No results found for this basename: LIPASE:5,AMYLASE:5 in the last 168 hours No results found for this basename: AMMONIA:5 in the last 168 hours CBC:  Lab 08/09/12 0450 08/08/12 0455 08/07/12 1545 08/07/12 0605  WBC 10.3 11.5* 8.3 11.6*  NEUTROABS -- -- -- 9.1*  HGB 12.3 11.9* 12.2 14.1  HCT 38.0 36.5 38.1 42.8  MCV 90.9 90.8 90.9 90.5  PLT 141* 140* 147* 156   Cardiac Enzymes: No results found for this basename: CKTOTAL:5,CKMB:5,CKMBINDEX:5,TROPONINI:5 in the last 168 hours BNP (last 3 results) No results found for this basename: PROBNP:3 in the last 8760 hours CBG:  Lab 08/09/12 1056 08/09/12 0621 08/08/12 2042 08/08/12 1613 08/08/12 1106  GLUCAP 192* 105* 187* 93 185*    Recent Results (from the past 240 hour(s))  OVA AND PARASITE EXAMINATION     Status: Normal   Collection Time   08/08/12 12:18 PM      Component Value  Range Status Comment   Specimen Description STOOL   Final    Special Requests NONE   Final    Ova and parasites NO OVA OR PARASITES SEEN   Final    Report Status 08/09/2012 FINAL   Final   CLOSTRIDIUM DIFFICILE BY PCR     Status: Normal   Collection Time   08/08/12 12:18 PM      Component Value Range Status Comment   C difficile by pcr NEGATIVE  NEGATIVE Final      Studies: Dg Chest Port 1 View  08/09/2012  *RADIOLOGY REPORT*  Clinical Data: Wheezing  PORTABLE CHEST - 1 VIEW  Comparison: 02/28/2012  Findings: Bilateral shoulder arthroplasty hardware partially visualized.  Lungs clear.  Heart size upper limits normal. Tortuous atheromatous aorta.  No effusion.  IMPRESSION:  1.  Stable appearance since previous exam   Original Report Authenticated By: D. Andria Rhein, MD     Scheduled Meds:    . amLODipine  5 mg Oral Daily  . B-complex with vitamin C  1-2 tablet Oral Daily  . benazepril  20 mg Oral Daily  . cefTRIAXone (ROCEPHIN)  IV  1 g Intravenous Q24H  . vitamin B-12  500 mcg Oral Daily  . darifenacin  7.5 mg Oral Daily  . ferrous gluconate  324 mg Oral Q breakfast  . insulin aspart  0-9 Units Subcutaneous TID WC  . labetalol  200 mg Oral BID  . magnesium oxide  200 mg Oral Daily  . omega-3 acid ethyl esters  1 g Oral Daily  . saccharomyces boulardii  500 mg Oral BID  . simvastatin  20 mg Oral q1800  . sodium chloride  3 mL Intravenous Q12H   Continuous Infusions:   Principal Problem:  *Colitis Active Problems:  Weakness  DM (diabetes mellitus)  HTN (hypertension)  Dyslipidemia  Rectal bleeding    Time spent: > 35 mins    [facility]  [facility] Pager [phone]. If 8PM-8AM, please contact night-coverage at [email or web address], password [facility] 08/09/2012, 12:55 PM  LOS: 2 days

## 2012-08-10 DIAGNOSIS — K625 Hemorrhage of anus and rectum: Secondary | ICD-10-CM | POA: Diagnosis not present

## 2012-08-10 DIAGNOSIS — K55059 Acute (reversible) ischemia of intestine, part and extent unspecified: Secondary | ICD-10-CM | POA: Diagnosis not present

## 2012-08-10 LAB — CBC
HCT: 35.1 % — ABNORMAL LOW (ref 36.0–46.0)
Hemoglobin: 10.8 g/dL — ABNORMAL LOW (ref 12.0–15.0)
MCH: 28.6 pg (ref 26.0–34.0)
MCHC: 30.8 g/dL (ref 30.0–36.0)
MCV: 92.9 fL (ref 78.0–100.0)
RDW: 14 % (ref 11.5–15.5)

## 2012-08-10 LAB — BASIC METABOLIC PANEL
BUN: 14 mg/dL (ref 6–23)
Creatinine, Ser: 0.74 mg/dL (ref 0.50–1.10)
GFR calc non Af Amer: 73 mL/min — ABNORMAL LOW (ref >90)
Glucose, Bld: 118 mg/dL — ABNORMAL HIGH (ref 70–99)
Potassium: 4 mEq/L (ref 3.5–5.1)

## 2012-08-10 LAB — GLUCOSE, CAPILLARY: Glucose-Capillary: 147 mg/dL — ABNORMAL HIGH (ref 70–99)

## 2012-08-10 NOTE — Progress Notes (Signed)
Patient ID: Heidi Dominguez, female   DOB: April 24, 1921, 78 y.o.   MRN: [id number] [facility] Progress Note  Heidi Dominguez 78 y.o. 1920-09-29   Subjective: Feels better. Small amount of pain in right side of abdomen. Sitting up in chair about to eat lunch. Denies diarrhea.  Objective: Vital signs: Filed Vitals:   08/09/12 2211  BP: 154/53  Pulse: 63  Temp: 99 F (37.2 C)  Resp: 18    Physical Exam: Gen: alert, no acute distress  Abd: minimal right-sided tenderness without guarding, soft, nondistended, positive bowel sounds  Lab Results:  Basename 08/10/12 0513 08/09/12 0450 08/07/12 1545  NA 142 140 --  K 4.0 4.0 --  CL 106 105 --  CO2 30 27 --  GLUCOSE 118* 114* --  BUN 14 13 --  CREATININE 0.74 0.79 --  CALCIUM 9.2 9.1 --  MG -- -- 1.7  PHOS -- -- --   No results found for this basename: AST:2,ALT:2,ALKPHOS:2,BILITOT:2,PROT:2,ALBUMIN:2 in the last 72 hours  Basename 08/10/12 0513 08/09/12 0450  WBC 6.1 10.3  NEUTROABS -- --  HGB 10.8* 12.3  HCT 35.1* 38.0  MCV 92.9 90.9  PLT 132* 141*      Assessment/Plan: Resolving ischemic colitis - continue POs and supportive care. Will sign off. F/U with Dr. Madilyn Fireman in 3-4 weeks. Call if questions.   Frances Ambrosino C. 08/10/2012, 12:04 PM

## 2012-08-10 NOTE — Progress Notes (Signed)
[facility] PROGRESS NOTE  Heidi Dominguez:[id number] DOB: 1921/01/25 DOA: 08/07/2012 PCP: Ginette Otto, MD  Assessment/Plan:  #1 ischemic colitis Some clinical improvement. Patient's diuretics have been held. C. difficile PCR is negative. Stool studies are pending. Continue to hold diuretics. H&H is stable. NSL IVF. Patient's diet has been advanced and patient is currently tolerating a heart healthy diet. GI has signed off.  She passed a non-bloody stool 12/27  #2 rectal bleeding Secondary to problem #1. Patient's hemoglobin ranges 10-12 today. Patient with no noted bloody stools today. Saline lock  IV fluids.   #3 well controlled diabetes mellitus type 2 Hemoglobin A1c is 5.9. CBGs have ranged from 105-197. Continue to hold oral hypoglycemic agents. Sliding scale insulin.  #4 hypertension Continue labetalol 200 bid , Norvasc 5mg  and Benazepril 20mg   #5 dyslipidemia Continue statin-Zocor 20 daily  #6 history of chronic CHF Stable compensated. Patient's diuretics on hold secondary to problem #1. Saline lock IV fluids. Continue Norvasc ACE inhibitor labetalol. Follow.  #7 probable UTI Urine cultures are pending. Change IV Rocephin to oral Ceftin 250 bid  #8 prophylaxis SCDs for DVT prophylaxis.   Code Status: Full Family Communication: Updated patient. No family at bedside. Disposition Plan:  SNF   Consultants:  Gastroenterology: Dr. Matthias Hughs  03/07/2012  Procedures:  None  Antibiotics:  IV Rocephin 08/08/2012 >>>08/09/2012  Oral Ceftin 08/09/2012  HPI/Subjective: Patient states feels well.  tolerating full diet.  No stool today.  No n/v/cp/diarr  Objective: Filed Vitals:   08/08/12 2045 08/09/12 0457 08/09/12 1406 08/09/12 2211  BP: 132/53 127/45 154/73 154/53  Pulse: 72 70 67 63  Temp: 99.1 F (37.3 C) 98.8 F (37.1 C) 98.4 F (36.9 C) 99 F (37.2 C)  TempSrc: Oral Oral Oral Oral  Resp: 20 20 18 18   Height:      Weight:  83.553 kg  (184 lb 3.2 oz)    SpO2: 95% 95% 90% 97%    Intake/Output Summary (Last 24 hours) at 08/10/12 1314 Last data filed at 08/09/12 2148  Gross per 24 hour  Intake    443 ml  Output      0 ml  Net    443 ml   Filed Weights   08/07/12 1102 08/08/12 0647 08/09/12 0457  Weight: 83.2 kg (183 lb 6.8 oz) 84.1 kg (185 lb 6.5 oz) 83.553 kg (184 lb 3.2 oz)    Exam:   General:  NAD  Cardiovascular: RRR with 3/6 SEM. No LE edema.  Respiratory: clear, no added sound  Abdomen: Soft/NT/ND/+BS  Data Reviewed: Basic Metabolic Panel:  Lab 08/10/12 8119 08/09/12 0450 08/08/12 0455 08/07/12 1545 08/07/12 0605  NA 142 140 138 -- 143  K 4.0 4.0 4.1 -- 3.6  CL 106 105 100 -- 102  CO2 30 27 30  -- 29  GLUCOSE 118* 114* 107* -- 182*  BUN 14 13 15  -- 16  CREATININE 0.74 0.79 0.99 -- 0.80  CALCIUM 9.2 9.1 9.0 -- 10.2  MG -- -- -- 1.7 --  PHOS -- -- -- -- --   Liver Function Tests: No results found for this basename: AST:5,ALT:5,ALKPHOS:5,BILITOT:5,PROT:5,ALBUMIN:5 in the last 168 hours No results found for this basename: LIPASE:5,AMYLASE:5 in the last 168 hours No results found for this basename: AMMONIA:5 in the last 168 hours CBC:  Lab 08/10/12 0513 08/09/12 0450 08/08/12 0455 08/07/12 1545 08/07/12 0605  WBC 6.1 10.3 11.5* 8.3 11.6*  NEUTROABS -- -- -- -- 9.1*  HGB 10.8* 12.3 11.9* 12.2 14.1  HCT 35.1* 38.0 36.5 38.1 42.8  MCV 92.9 90.9 90.8 90.9 90.5  PLT 132* 141* 140* 147* 156   Cardiac Enzymes: No results found for this basename: CKTOTAL:5,CKMB:5,CKMBINDEX:5,TROPONINI:5 in the last 168 hours BNP (last 3 results) No results found for this basename: PROBNP:3 in the last 8760 hours CBG:  Lab 08/10/12 1138 08/10/12 0528 08/09/12 2158 08/09/12 1602 08/09/12 1056  GLUCAP 147* 111* 131* 197* 192*    Recent Results (from the past 240 hour(s))  OVA AND PARASITE EXAMINATION     Status: Normal   Collection Time   08/08/12 12:18 PM      Component Value Range Status Comment    Specimen Description STOOL   Final    Special Requests NONE   Final    Ova and parasites NO OVA OR PARASITES SEEN   Final    Report Status 08/09/2012 FINAL   Final   CLOSTRIDIUM DIFFICILE BY PCR     Status: Normal   Collection Time   08/08/12 12:18 PM      Component Value Range Status Comment   C difficile by pcr NEGATIVE  NEGATIVE Final      Studies: Dg Chest Port 1 View  08/09/2012  *RADIOLOGY REPORT*  Clinical Data: Wheezing  PORTABLE CHEST - 1 VIEW  Comparison: 02/28/2012  Findings: Bilateral shoulder arthroplasty hardware partially visualized.  Lungs clear.  Heart size upper limits normal. Tortuous atheromatous aorta.  No effusion.  IMPRESSION:  1.  Stable appearance since previous exam   Original Report Authenticated By: D. Andria Rhein, MD     Scheduled Meds:    . amLODipine  5 mg Oral Daily  . B-complex with vitamin C  1-2 tablet Oral Daily  . benazepril  20 mg Oral Daily  . cefUROXime  250 mg Oral BID WC  . vitamin B-12  500 mcg Oral Daily  . darifenacin  7.5 mg Oral Daily  . ferrous gluconate  324 mg Oral Q breakfast  . insulin aspart  0-9 Units Subcutaneous TID WC  . labetalol  200 mg Oral BID  . magnesium oxide  200 mg Oral Daily  . omega-3 acid ethyl esters  1 g Oral Daily  . saccharomyces boulardii  500 mg Oral BID  . simvastatin  20 mg Oral q1800  . sodium chloride  3 mL Intravenous Q12H   Continuous Infusions:   Principal Problem:  *Ischemic colitis Active Problems:  Weakness  DM (diabetes mellitus)  HTN (hypertension)  Dyslipidemia  Colitis  Rectal bleeding    Time spent: > 35 mins    Rhetta Mura  [facility] Pager [phone]. If 8PM-8AM, please contact night-coverage at [email or web address], password [facility] 08/10/2012, 1:14 PM  LOS: 3 days

## 2012-08-11 DIAGNOSIS — K625 Hemorrhage of anus and rectum: Secondary | ICD-10-CM | POA: Diagnosis not present

## 2012-08-11 LAB — BASIC METABOLIC PANEL
BUN: 15 mg/dL (ref 6–23)
CO2: 29 mEq/L (ref 19–32)
Calcium: 9.5 mg/dL (ref 8.4–10.5)
Glucose, Bld: 121 mg/dL — ABNORMAL HIGH (ref 70–99)
Potassium: 3.9 mEq/L (ref 3.5–5.1)
Sodium: 144 mEq/L (ref 135–145)

## 2012-08-11 LAB — CBC
HCT: 37.8 % (ref 36.0–46.0)
Hemoglobin: 11.9 g/dL — ABNORMAL LOW (ref 12.0–15.0)
MCH: 29.1 pg (ref 26.0–34.0)
RBC: 4.09 MIL/uL (ref 3.87–5.11)

## 2012-08-11 LAB — URINE MICROSCOPIC-ADD ON

## 2012-08-11 LAB — URINALYSIS, ROUTINE W REFLEX MICROSCOPIC
Bilirubin Urine: NEGATIVE
Glucose, UA: NEGATIVE mg/dL
Ketones, ur: NEGATIVE mg/dL
Protein, ur: 30 mg/dL — AB

## 2012-08-11 LAB — GLUCOSE, CAPILLARY
Glucose-Capillary: 118 mg/dL — ABNORMAL HIGH (ref 70–99)
Glucose-Capillary: 183 mg/dL — ABNORMAL HIGH (ref 70–99)

## 2012-08-11 NOTE — Progress Notes (Signed)
[facility] PROGRESS NOTE  Heidi VANDERHOOF BJY:[id number] DOB: 11-14-1920 DOA: 08/07/2012 PCP: Ginette Otto, MD  Assessment/Plan:  #1 ischemic colitis Some clinical improvement. Patient's diuretics have been held. C. difficile PCR is negative. Stool studies are pending. Continue to hold diuretics. H&H is stable. NSL IVF. Patient's diet has been advanced and patient is currently tolerating a heart healthy diet. GI has signed off.  She passed a non-bloody stool 12/27  #2 rectal bleeding Secondary to problem #1. Patient's hemoglobin ranges 10-12 today. Patient with no noted bloody stools today. Saline lock  IV fluids.   #3 well controlled diabetes mellitus type 2 Hemoglobin A1c is 5.9. CBGs have ranged from 105-197. Continue to hold oral hypoglycemic agents. Sliding scale insulin.  #4 hypertension Continue labetalol 200 bid , Norvasc 5mg  and Benazepril 20mg   #5 dyslipidemia Continue statin-Zocor 20 daily  #6 history of chronic CHF Stable compensated. Patient's diuretics on hold secondary to problem #1. Saline lock IV fluids. Continue Norvasc ACE inhibitor labetalol. Follow.  #7 probable UTI Urine cultures are pending. Change IV Rocephin to oral Ceftin 250 bid  #8 prophylaxis SCDs for DVT prophylaxis.   Code Status: Full Family Communication: Spoke with daughter [name] in detail Disposition Plan:  SNF   Consultants:  Gastroenterology: Dr. Matthias Hughs  03/07/2012  Procedures:  None  Antibiotics:  IV Rocephin 08/08/2012 >>>08/09/2012  Oral Ceftin 08/09/2012  HPI/Subjective: Patient doing well.  No bleeding no abd pain-wants to know why she had the bleeding No n/v/cp/diarr  Objective: Filed Vitals:   08/10/12 1400 08/10/12 2124 08/11/12 0500 08/11/12 1417  BP: 156/70 149/68 139/72 131/49  Pulse: 64 66 74 66  Temp: 98.1 F (36.7 C) 98.2 F (36.8 C) 98 F (36.7 C) 98.1 F (36.7 C)  TempSrc: Oral Oral Oral Oral  Resp: 18 18 18 19   Height:        Weight:   83.8 kg (184 lb 11.9 oz)   SpO2: 100% 98% 98% 97%    Intake/Output Summary (Last 24 hours) at 08/11/12 1841 Last data filed at 08/11/12 1417  Gross per 24 hour  Intake   1080 ml  Output    575 ml  Net    505 ml   Filed Weights   08/08/12 0647 08/09/12 0457 08/11/12 0500  Weight: 84.1 kg (185 lb 6.5 oz) 83.553 kg (184 lb 3.2 oz) 83.8 kg (184 lb 11.9 oz)    Exam:   General:  NAD  Cardiovascular: RRR with 3/6 SEM. No LE edema.  Respiratory: clear, no added sound  Abdomen: Soft/NT/ND/+BS  Data Reviewed: Basic Metabolic Panel:  Lab 08/11/12 0865 08/10/12 0513 08/09/12 0450 08/08/12 0455 08/07/12 1545 08/07/12 0605  NA 144 142 140 138 -- 143  K 3.9 4.0 4.0 4.1 -- 3.6  CL 106 106 105 100 -- 102  CO2 29 30 27 30  -- 29  GLUCOSE 121* 118* 114* 107* -- 182*  BUN 15 14 13 15  -- 16  CREATININE 0.70 0.74 0.79 0.99 -- 0.80  CALCIUM 9.5 9.2 9.1 9.0 -- 10.2  MG -- -- -- -- 1.7 --  PHOS -- -- -- -- -- --   Liver Function Tests: No results found for this basename: AST:5,ALT:5,ALKPHOS:5,BILITOT:5,PROT:5,ALBUMIN:5 in the last 168 hours No results found for this basename: LIPASE:5,AMYLASE:5 in the last 168 hours No results found for this basename: AMMONIA:5 in the last 168 hours CBC:  Lab 08/11/12 0555 08/10/12 0513 08/09/12 0450 08/08/12 0455 08/07/12 1545 08/07/12 0605  WBC 5.8 6.1 10.3 11.5*  8.3 --  NEUTROABS -- -- -- -- -- 9.1*  HGB 11.9* 10.8* 12.3 11.9* 12.2 --  HCT 37.8 35.1* 38.0 36.5 38.1 --  MCV 92.4 92.9 90.9 90.8 90.9 --  PLT 155 132* 141* 140* 147* --   Cardiac Enzymes: No results found for this basename: CKTOTAL:5,CKMB:5,CKMBINDEX:5,TROPONINI:5 in the last 168 hours BNP (last 3 results) No results found for this basename: PROBNP:3 in the last 8760 hours CBG:  Lab 08/11/12 1609 08/11/12 1051 08/11/12 0602 08/10/12 2103 08/10/12 1559  GLUCAP 142* 183* 118* 156* 113*    Recent Results (from the past 240 hour(s))  OVA AND PARASITE EXAMINATION      Status: Normal   Collection Time   08/08/12 12:18 PM      Component Value Range Status Comment   Specimen Description STOOL   Final    Special Requests NONE   Final    Ova and parasites NO OVA OR PARASITES SEEN   Final    Report Status 08/09/2012 FINAL   Final   STOOL CULTURE     Status: Normal (Preliminary result)   Collection Time   08/08/12 12:18 PM      Component Value Range Status Comment   Specimen Description STOOL   Final    Special Requests NONE   Final    Culture NO SUSPICIOUS COLONIES, CONTINUING TO HOLD   Final    Report Status PENDING   Incomplete   CLOSTRIDIUM DIFFICILE BY PCR     Status: Normal   Collection Time   08/08/12 12:18 PM      Component Value Range Status Comment   C difficile by pcr NEGATIVE  NEGATIVE Final      Studies: No results found.  Scheduled Meds:    . amLODipine  5 mg Oral Daily  . B-complex with vitamin C  1-2 tablet Oral Daily  . benazepril  20 mg Oral Daily  . cefUROXime  250 mg Oral BID WC  . vitamin B-12  500 mcg Oral Daily  . darifenacin  7.5 mg Oral Daily  . ferrous gluconate  324 mg Oral Q breakfast  . insulin aspart  0-9 Units Subcutaneous TID WC  . labetalol  200 mg Oral BID  . magnesium oxide  200 mg Oral Daily  . omega-3 acid ethyl esters  1 g Oral Daily  . saccharomyces boulardii  500 mg Oral BID  . simvastatin  20 mg Oral q1800  . sodium chloride  3 mL Intravenous Q12H   Continuous Infusions:   Principal Problem:  *Ischemic colitis Active Problems:  Weakness  DM (diabetes mellitus)  HTN (hypertension)  Dyslipidemia  Colitis  Rectal bleeding        Rhetta Mura  [facility] Pager [phone]. If 8PM-8AM, please contact night-coverage at [email or web address], password [facility] 08/11/2012, 6:41 PM  LOS: 4 days

## 2012-08-12 DIAGNOSIS — I509 Heart failure, unspecified: Secondary | ICD-10-CM | POA: Diagnosis not present

## 2012-08-12 DIAGNOSIS — E119 Type 2 diabetes mellitus without complications: Secondary | ICD-10-CM | POA: Diagnosis not present

## 2012-08-12 DIAGNOSIS — K2100 Gastro-esophageal reflux disease with esophagitis, without bleeding: Secondary | ICD-10-CM | POA: Diagnosis not present

## 2012-08-12 DIAGNOSIS — K5289 Other specified noninfective gastroenteritis and colitis: Secondary | ICD-10-CM | POA: Diagnosis not present

## 2012-08-12 DIAGNOSIS — I10 Essential (primary) hypertension: Secondary | ICD-10-CM | POA: Diagnosis not present

## 2012-08-12 DIAGNOSIS — N189 Chronic kidney disease, unspecified: Secondary | ICD-10-CM | POA: Diagnosis not present

## 2012-08-12 DIAGNOSIS — E785 Hyperlipidemia, unspecified: Secondary | ICD-10-CM | POA: Diagnosis not present

## 2012-08-12 DIAGNOSIS — K559 Vascular disorder of intestine, unspecified: Secondary | ICD-10-CM | POA: Diagnosis not present

## 2012-08-12 DIAGNOSIS — R262 Difficulty in walking, not elsewhere classified: Secondary | ICD-10-CM | POA: Diagnosis not present

## 2012-08-12 DIAGNOSIS — K5190 Ulcerative colitis, unspecified, without complications: Secondary | ICD-10-CM | POA: Diagnosis not present

## 2012-08-12 DIAGNOSIS — K922 Gastrointestinal hemorrhage, unspecified: Secondary | ICD-10-CM | POA: Diagnosis not present

## 2012-08-12 DIAGNOSIS — K625 Hemorrhage of anus and rectum: Secondary | ICD-10-CM | POA: Diagnosis not present

## 2012-08-12 DIAGNOSIS — N390 Urinary tract infection, site not specified: Secondary | ICD-10-CM | POA: Diagnosis not present

## 2012-08-12 DIAGNOSIS — Z9181 History of falling: Secondary | ICD-10-CM | POA: Diagnosis not present

## 2012-08-12 DIAGNOSIS — M6250 Muscle wasting and atrophy, not elsewhere classified, unspecified site: Secondary | ICD-10-CM | POA: Diagnosis not present

## 2012-08-12 DIAGNOSIS — M6281 Muscle weakness (generalized): Secondary | ICD-10-CM | POA: Diagnosis not present

## 2012-08-12 LAB — GLUCOSE, CAPILLARY
Glucose-Capillary: 118 mg/dL — ABNORMAL HIGH (ref 70–99)
Glucose-Capillary: 145 mg/dL — ABNORMAL HIGH (ref 70–99)

## 2012-08-12 MED ORDER — CEFUROXIME AXETIL 250 MG PO TABS: 250.0000 mg | ORAL_TABLET | Freq: Two times a day (BID) | ORAL | Status: DC

## 2012-08-12 MED ORDER — ACETAMINOPHEN 325 MG PO TABS: 650.0000 mg | ORAL_TABLET | Freq: Four times a day (QID) | ORAL | Status: DC | PRN

## 2012-08-12 NOTE — Progress Notes (Signed)
IV d/c'd.  Tele d/c'd.  Pt d/c'd to SNF.  Home meds and d/c instructions have been reviewed with pt and pt daughter.  Both deny any questions or concerns at this time.  Pt awaiting ambulance transport to [facility].  Report has been called to receiving nurse at facility.  Nurse also denies any questions or concerns at this time. Nino Glow RN

## 2012-08-12 NOTE — Progress Notes (Signed)
Occupational Therapy Treatment Patient Details Name: Heidi Dominguez MRN: [id number] DOB: 11-16-1920 Today's Date: 08/12/2012 Time: 8295-6213 OT Time Calculation (min): 28 min  OT Assessment / Plan / Recommendation Comments on Treatment Session This 78 yo making porgress will benefit from continued OT at SNF (pt to D/C todsay).    Follow Up Recommendations  SNF       Equipment Recommendations  3 in 1 bedside comode;Tub/shower bench       Frequency Min 2X/week   Plan Discharge plan remains appropriate    Precautions / Restrictions Precautions Precautions: Fall Precaution Comments: history of falls, last fall 2 mo. ago Restrictions Weight Bearing Restrictions: No       ADL  Lower Body Bathing: Performed;Minimal assistance Where Assessed - Lower Body Bathing: Supported sit to stand Toilet Transfer: Performed;Minimal assistance Toilet Transfer Method: Sit to Barista: Bedside commode Toileting - Clothing Manipulation and Hygiene: Performed;Supervision/safety (min A for standing balance) Where Assessed - Toileting Clothing Manipulation and Hygiene: Standing Equipment Used: Rolling walker Transfers/Ambulation Related to ADLs: Min A sit to stand and stand to sit as well as for ambulation in room with RW      OT Goals ADL Goals ADL Goal: Statistician - Progress: Progressing toward goals ADL Goal: Toileting - Clothing Manipulation - Progress: Progressing toward goals ADL Goal: Toileting - Hygiene - Progress: Progressing toward goals Miscellaneous OT Goals OT Goal: Miscellaneous Goal #1 - Progress: Progressing toward goals  Visit Information  Last OT Received On: 08/12/12 Assistance Needed: +1    Subjective Data  Subjective: I believe that I am wet (when I asked her to stand up)      Cognition  Overall Cognitive Status: Appears within functional limits for tasks assessed/performed Arousal/Alertness: Awake/alert Orientation Level: Appears  intact for tasks assessed Behavior During Session: [facility] for tasks performed    Mobility   Bed Mobility Bed Mobility: Not assessed Transfers Transfers: Sit to Stand;Stand to Sit Sit to Stand: 4: Min assist;With upper extremity assist;With armrests;From chair/3-in-1 Stand to Sit: 4: Min assist;With upper extremity assist;With armrests;To chair/3-in-1 Details for Transfer Assistance: gaurding for stability but no physical assist needed today until she sat in a deep chair and needed modA to bring trunk anteriorly over feet and stability during power up       Exercises  General Exercises - Lower Extremity Hip ABduction/ADduction: AROM;Both;10 reps;Standing Hip Flexion/Marching: AROM;Both;20 reps;Standing Toe Raises: AROM;Both;20 reps;Standing Heel Raises: AROM;Both;20 reps;Standing Mini-Sqauts: AROM;Both;20 reps;Standing   Balance Static Standing Balance Static Standing - Balance Support: No upper extremity supported Static Standing - Level of Assistance: 5: Stand by assistance Static Standing - Comment/# of Minutes: 15 seconds without upper extremities supported needing mingaurdA, supervision for standing with bilateral upper extremities supported by RW   End of Session OT - End of Session Activity Tolerance: Patient tolerated treatment well Patient left: in chair;with call bell/phone within reach;with family/visitor present       Evette Georges [phone] 08/12/2012, 3:49 PM

## 2012-08-12 NOTE — Discharge Summary (Signed)
Physician Discharge Summary  Heidi Dominguez AVW:[id number] DOB: Dec 02, 1920 DOA: 08/07/2012  PCP: Ginette Otto, MD  Admit date: 08/07/2012 Discharge date: 08/12/2012  Time spent: 35 minutes  Recommendations for Outpatient Follow-up:  1. Please obtain a basic metabolic panel and CBC in 3-5 days 2. High-fiber diet 3. She doesn't recommended by physical therapy for skilled nursing care given safety concerns with mobility and balance  4. She will need 1 more day of by mouth antibiotics to cover a urinary tract infection  5. Recommend re-implementation of diuretics as per cardiologist/primary care physician  Discharge Diagnoses:  Principal Problem:  *Ischemic colitis Active Problems:  Weakness  DM (diabetes mellitus)  HTN (hypertension)  Dyslipidemia  Colitis  Rectal bleeding   Discharge Condition: good  Diet recommendation: high-fiber  Filed Weights   08/09/12 0457 08/11/12 0500 08/12/12 0437  Weight: 83.553 kg (184 lb 3.2 oz) 83.8 kg (184 lb 11.9 oz) 83.6 kg (184 lb 4.9 oz)    History of present illness:  Pleasant 78 year old female with hypertension diabetes CHF with prior history lower GI bleed thought to be diverticular in 2.2013 presented to emergency room with one-day history of bright red blood-she took face over-the-counter remedies including Gas-X and Kaopectate. CT scan of abdomen is worrisome for segmental colitis she was given IV Rocephin and Flagyl-she was checked for C. Difficile which was negative.  Hospital Course:  #1 ischemic colitis  C. difficile PCR is negative. Stool studies are negative for suspicious colonies. Continue to hold diuretics. H&H is stable. NSL IVF. Patient's diet has been advanced and patient is currently tolerating a heart healthy diet. GI has signed off. She passed a non-bloody stool 12/29 and is eating and drinking and tolerating full diet. She'll need outpatient interval surveillance and management #2 rectal bleeding  Secondary to  problem #1. Patient's hemoglobin ranges 10-12 today. Patient with no noted bloody stools today. Saline lock IV fluids.  #3 well controlled diabetes mellitus type 2  Hemoglobin A1c is 5.9. CBGs have ranged from 105-197. Continue to hold oral hypoglycemic agents-these were resumed on day of discharge  #4 hypertension  Continue labetalol 200 bid , Norvasc 5mg  and Benazepril 20mg   #5 dyslipidemia  Continue statin-Zocor 20 daily  #6 history of chronic CHF  Stable compensated. Patient's diuretics on hold secondary to problem #1. Saline lock IV fluids. Continue Norvasc ACE inhibitor labetalol. Follow.  #7 probable UTI  Urine cultures are pending. Change IV Rocephin to oral Ceftin 250 bid-start date 08/13/2029  Consultants:  Gastroenterology: Dr. Matthias Hughs 03/07/2012 Procedures:  CT scan 12/25 = edema of proximal descending colon, splenic flexure colon, distal transverse colon most consistent with colitis Chest x-ray 12/27 stable appearance  Antibiotics:  IV Rocephin 08/08/2012 >>>08/09/2012  Oral Ceftin 08/09/2012>>12/31 stop  Discharge Exam: Filed Vitals:   08/11/12 0500 08/11/12 1417 08/11/12 2044 08/12/12 0437  BP: 139/72 131/49 134/53 138/74  Pulse: 74 66 69 72  Temp: 98 F (36.7 C) 98.1 F (36.7 C) 97.9 F (36.6 C) 98.3 F (36.8 C)  TempSrc: Oral Oral Oral Oral  Resp: 18 19 20 20   Height:      Weight: 83.8 kg (184 lb 11.9 oz)   83.6 kg (184 lb 4.9 oz)  SpO2: 98% 97% 98% 97%   Doing well eating breakfast no complaints. Tolerating full diet No stool  General: Alert oriented Cardiovascular: S1-S2 no murmur rub or gallop Respiratory: clinically clear  Discharge Instructions  Follow-up Information    Follow up with Ginette Otto, MD. In 1  month.   Contact information:   [address] [place] [address] [phone]       Follow up with HAYES,JOHN C, MD. In 1 week.   Contact information:   [address]                          [place] [address] [phone]          Discharge Orders    Future Orders Please Complete By Expires   Diet - low sodium heart healthy      Increase activity slowly      Call MD for:  temperature >100.4      Call MD for:  severe uncontrolled pain      Call MD for:  difficulty breathing, headache or visual disturbances      Call MD for:  hives      Call MD for:  persistant dizziness or light-headedness          Medication List     As of 08/12/2012  8:02 AM    STOP taking these medications         B-complex with vitamin C tablet      furosemide 20 MG tablet   Commonly known as: LASIX      potassium chloride 10 MEQ tablet   Commonly known as: K-DUR      TAKE these medications         acetaminophen 325 MG tablet   Commonly known as: TYLENOL   Take 2 tablets (650 mg total) by mouth every 6 (six) hours as needed (or Fever >/= 101).      amLODipine-benazepril 5-20 MG per capsule   Commonly known as: LOTREL   Take 1 capsule by mouth daily.      cefUROXime 250 MG tablet   Commonly known as: CEFTIN   Take 1 tablet (250 mg total) by mouth 2 (two) times daily with a meal.      darifenacin 7.5 MG 24 hr tablet   Commonly known as: ENABLEX   Take 7.5 mg by mouth daily.      fish oil-omega-3 fatty acids 1000 MG capsule   Take 1 g by mouth daily.      glimepiride 1 MG tablet   Commonly known as: AMARYL   Take 1 mg by mouth daily with breakfast.      Iron 240 (27 FE) MG Tabs   Take 1 tablet by mouth daily.      labetalol 200 MG tablet   Commonly known as: NORMODYNE   Take 200 mg by mouth 2 (two) times daily.      Magnesium 250 MG Tabs   Take 1 tablet by mouth daily.      pravastatin 40 MG tablet   Commonly known as: PRAVACHOL   Take 40 mg by mouth daily.      ranitidine 150 MG tablet   Commonly known as: ZANTAC   Take 1 tablet (150 mg total) by mouth 2 (two) times daily.      sitaGLIPtin 100 MG tablet   Commonly known as: JANUVIA   Take 100  mg by mouth daily.      vitamin B-12 500 MCG tablet   Commonly known as: CYANOCOBALAMIN   Take 500 mcg by mouth daily.      Vitamin D3 5000 UNITS Caps   Take 1 capsule by mouth daily.  The results of significant diagnostics from this hospitalization (including imaging, microbiology, ancillary and laboratory) are listed below for reference.    Significant Diagnostic Studies: Ct Abdomen Pelvis W Contrast  08/07/2012  *RADIOLOGY REPORT*  Clinical Data: Pelvic pain, rectal bleeding, some abdominal pain  CT ABDOMEN AND PELVIS WITH CONTRAST  Technique:  Multidetector CT imaging of the abdomen and pelvis was performed following the standard protocol during bolus administration of intravenous contrast.  Contrast: OMNIPAQUE IOHEXOL 300 MG/ML  SOLN, 50mL OMNIPAQUE IOHEXOL 300 MG/ML  SOLN  Comparison: CT abdomen pelvis of 06/04/2011  Findings: Minimal atelectasis is noted posteriorly the right lung base.  A moderate sized hiatal hernia is again noted.  The heart is mildly enlarged and there is a tiny pericardial effusion versus pleural thickening anteriorly.  The liver enhances, with only slight prominence of the central intrahepatic ducts.  A gallstone again is noted to layer in the neck of the gallbladder.  The pancreas is normal in size and the pancreatic duct is not dilated. The adrenal glands and spleen are unremarkable.  The stomach is largely decompressed.  The kidneys enhance with no calculus or mass and a cyst is noted in the lower pole of the right kidney posterior medially.  On delayed images the pelvocaliceal systems are unremarkable.  The abdominal aorta is normal in caliber with atheromatous change present.  No adenopathy is seen.  The urinary bladder is moderately urine distended with no abnormality noted.  The uterus appears to have been removed.  No adnexal lesion is seen.  No fluid is noted within the pelvis. Although the colon is collapsed, there is suggestion of edema of  the proximal descending colon and splenic flexure of colon extending into the transverse colon to the mid proximal transverse colon most consistent with diffuse colitis.  The right colon is unremarkable.  There is a lumbar scoliosis convex to the right and there is diffuse degenerative change throughout the lumbar spine with compression deformities of T12, L1, and L3 vertebral bodies with vertebroplasty at L3.  IMPRESSION:  1.  Edema of the proximal descending colon, splenic flexure of colon, and distal transverse colon most consistent with colitis. 2.  Moderate sized hiatal hernia.   Original Report Authenticated By: Dwyane Dee, M.D.    Dg Chest Port 1 View  08/09/2012  *RADIOLOGY REPORT*  Clinical Data: Wheezing  PORTABLE CHEST - 1 VIEW  Comparison: 02/28/2012  Findings: Bilateral shoulder arthroplasty hardware partially visualized.  Lungs clear.  Heart size upper limits normal. Tortuous atheromatous aorta.  No effusion.  IMPRESSION:  1.  Stable appearance since previous exam   Original Report Authenticated By: D. Andria Rhein, MD     Microbiology: Recent Results (from the past 240 hour(s))  OVA AND PARASITE EXAMINATION     Status: Normal   Collection Time   08/08/12 12:18 PM      Component Value Range Status Comment   Specimen Description STOOL   Final    Special Requests NONE   Final    Ova and parasites NO OVA OR PARASITES SEEN   Final    Report Status 08/09/2012 FINAL   Final   STOOL CULTURE     Status: Normal (Preliminary result)   Collection Time   08/08/12 12:18 PM      Component Value Range Status Comment   Specimen Description STOOL   Final    Special Requests NONE   Final    Culture NO SUSPICIOUS COLONIES, CONTINUING TO HOLD  Final    Report Status PENDING   Incomplete   CLOSTRIDIUM DIFFICILE BY PCR     Status: Normal   Collection Time   08/08/12 12:18 PM      Component Value Range Status Comment   C difficile by pcr NEGATIVE  NEGATIVE Final      Labs: Basic Metabolic  Panel:  Lab 08/11/12 0555 08/10/12 0513 08/09/12 0450 08/08/12 0455 08/07/12 1545 08/07/12 0605  NA 144 142 140 138 -- 143  K 3.9 4.0 4.0 4.1 -- 3.6  CL 106 106 105 100 -- 102  CO2 29 30 27 30  -- 29  GLUCOSE 121* 118* 114* 107* -- 182*  BUN 15 14 13 15  -- 16  CREATININE 0.70 0.74 0.79 0.99 -- 0.80  CALCIUM 9.5 9.2 9.1 9.0 -- 10.2  MG -- -- -- -- 1.7 --  PHOS -- -- -- -- -- --   Liver Function Tests: No results found for this basename: AST:5,ALT:5,ALKPHOS:5,BILITOT:5,PROT:5,ALBUMIN:5 in the last 168 hours No results found for this basename: LIPASE:5,AMYLASE:5 in the last 168 hours No results found for this basename: AMMONIA:5 in the last 168 hours CBC:  Lab 08/11/12 0555 08/10/12 0513 08/09/12 0450 08/08/12 0455 08/07/12 1545 08/07/12 0605  WBC 5.8 6.1 10.3 11.5* 8.3 --  NEUTROABS -- -- -- -- -- 9.1*  HGB 11.9* 10.8* 12.3 11.9* 12.2 --  HCT 37.8 35.1* 38.0 36.5 38.1 --  MCV 92.4 92.9 90.9 90.8 90.9 --  PLT 155 132* 141* 140* 147* --   Cardiac Enzymes: No results found for this basename: CKTOTAL:5,CKMB:5,CKMBINDEX:5,TROPONINI:5 in the last 168 hours BNP: BNP (last 3 results) No results found for this basename: PROBNP:3 in the last 8760 hours CBG:  Lab 08/12/12 0600 08/11/12 2122 08/11/12 1609 08/11/12 1051 08/11/12 0602  GLUCAP 118* 189* 142* 183* 118*       Signed:  Rhetta Mura  [facility] 08/12/2012, 7:52 AM

## 2012-08-12 NOTE — Progress Notes (Signed)
Physical Therapy Treatment Patient Details Name: Heidi Dominguez MRN: [id number] DOB: [birth year] Today's Date: 08/12/2012 Time: 4098-1191 PT Time Calculation (min): 26 min  PT Assessment / Plan / Recommendation Comments on Treatment Session  Continues to make good progress.  Increased effort with sit->stand so practiced with mini squats today.     Follow Up Recommendations  SNF (short term)     Does the patient have the potential to tolerate intense rehabilitation     Barriers to Discharge        Equipment Recommendations  None recommended by PT    Recommendations for Other Services    Frequency Min 3X/week   Plan Discharge plan remains appropriate;Frequency remains appropriate    Precautions / Restrictions Precautions Precautions: Fall Precaution Comments: history of falls, last fall 2 mo. ago Restrictions Weight Bearing Restrictions: No       Mobility  Bed Mobility Bed Mobility: Not assessed Transfers Transfers: Sit to Stand;Stand to Sit;Stand Pivot Transfers Sit to Stand: 4: Min guard;From chair/3-in-1 (elevated surface);With upper extremity assist;With armrests;From bed (modA from lower surface) Stand to Sit: To bed;To chair/3-in-1;With upper extremity assist Stand Pivot Transfers: 4: Min guard;With armrests (3in1->bed) Details for Transfer Assistance: gaurding for stability but no physical assist needed today until she sat in a deep chair and needed modA to bring trunk anteriorly over feet and stability during power up Ambulation/Gait Ambulation/Gait Assistance: 4: Min guard Ambulation Distance (Feet): 500 Feet Assistive device: Rolling walker Ambulation/Gait Assistance Details: cues for tall posture and safe positioning within RW Gait Pattern: Trunk flexed;Shuffle    Exercises General Exercises - Lower Extremity Hip ABduction/ADduction: AROM;Both;10 reps;Standing Hip Flexion/Marching: AROM;Both;20 reps;Standing Toe Raises: AROM;Both;20 reps;Standing Heel  Raises: AROM;Both;20 reps;Standing Mini-Sqauts: AROM;Both;20 reps;Standing     PT Goals Acute Rehab PT Goals PT Goal: Sit to Stand - Progress: Progressing toward goal PT Goal: Stand to Sit - Progress: Progressing toward goal PT Transfer Goal: Bed to Chair/Chair to Bed - Progress: Progressing toward goal PT Goal: Ambulate - Progress: Progressing toward goal PT Goal: Perform Home Exercise Program - Progress: Progressing toward goal  Visit Information  Last PT Received On: 08/12/12 Assistance Needed: +1    Subjective Data  Subjective: My legs are feeling stronger.    Cognition  Overall Cognitive Status: Appears within functional limits for tasks assessed/performed Arousal/Alertness: Awake/alert Orientation Level: Appears intact for tasks assessed Behavior During Session: [facility] for tasks performed    Balance  Static Standing Balance Static Standing - Balance Support: No upper extremity supported Static Standing - Level of Assistance: 5: Stand by assistance Static Standing - Comment/# of Minutes: 15 seconds without upper extremities supported needing mingaurdA, supervision for standing with bilateral upper extremities supported by RW  End of Session PT - End of Session Equipment Utilized During Treatment: Gait belt Activity Tolerance: Patient tolerated treatment well Patient left: in chair;with call bell/phone within reach Nurse Communication: Mobility status   GP     [facility] HELEN 08/12/2012, 12:45 PM

## 2012-08-14 LAB — URINE CULTURE

## 2012-08-16 DIAGNOSIS — E7800 Pure hypercholesterolemia, unspecified: Secondary | ICD-10-CM | POA: Diagnosis not present

## 2012-08-16 DIAGNOSIS — K5180 Other ulcerative colitis without complications: Secondary | ICD-10-CM | POA: Diagnosis not present

## 2012-08-16 DIAGNOSIS — I10 Essential (primary) hypertension: Secondary | ICD-10-CM | POA: Diagnosis not present

## 2012-08-16 DIAGNOSIS — E119 Type 2 diabetes mellitus without complications: Secondary | ICD-10-CM | POA: Diagnosis not present

## 2013-01-19 IMAGING — CR DG CHEST 2V
2 series · 2 of 2 positions shown · non-contrast
Comparison: February 26, 2012.

CLINICAL DATA: Follow up infiltrate

CHEST - 2 VIEW

[w chest pa]
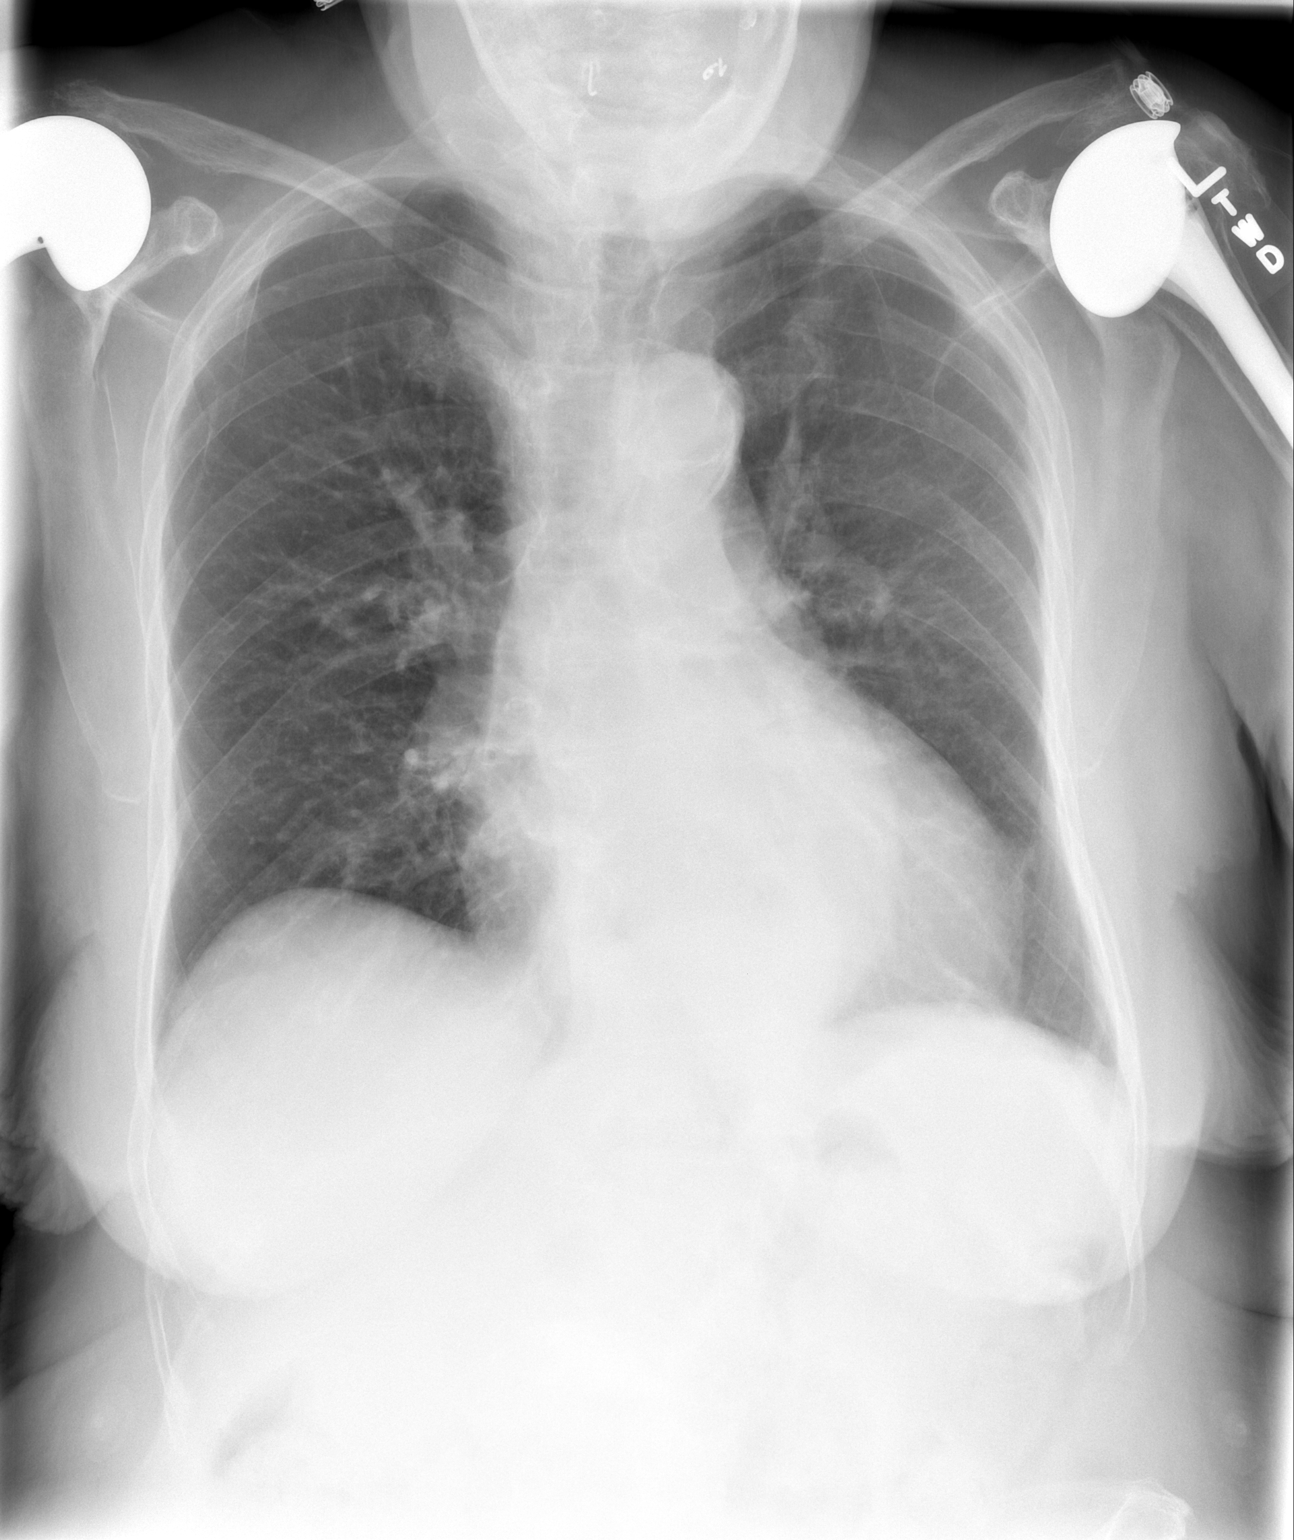

[w chest lat]
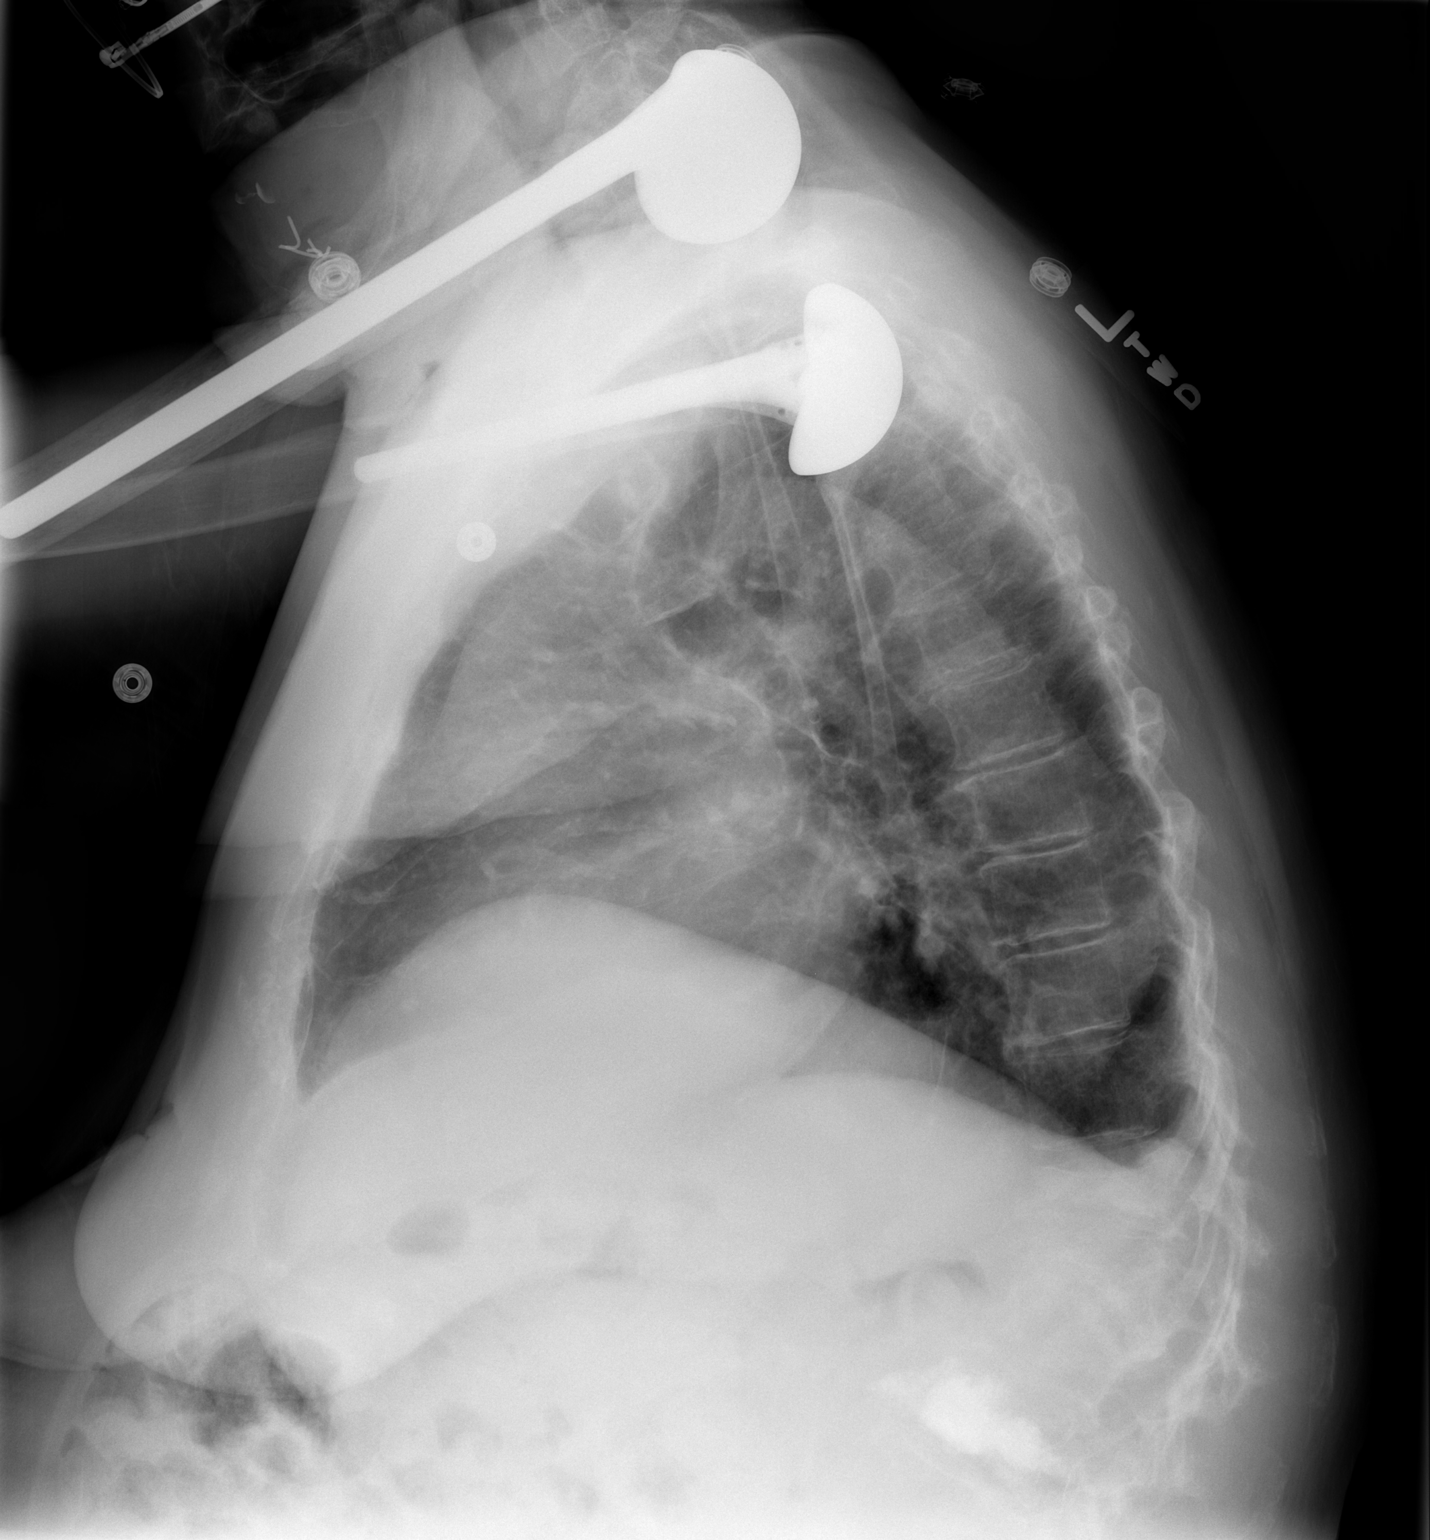

[2 of 2 positions shown; findings below may reference images not displayed]

FINDINGS: Cardiomediastinal silhouette appears normal.  Mild hiatal
hernia is noted. Left lung opacity noted on prior exam appears to
have resolved.  The right lung is clear.
IMPRESSION: No acute cardiopulmonary abnormality seen.

## 2013-04-08 ENCOUNTER — Other Ambulatory Visit: Payer: Self-pay | Admitting: Endocrinology

## 2013-04-09 NOTE — Telephone Encounter (Signed)
rx sent

## 2013-05-20 ENCOUNTER — Other Ambulatory Visit: Payer: Self-pay | Admitting: Geriatric Medicine

## 2013-05-20 ENCOUNTER — Ambulatory Visit
Admission: RE | Admit: 2013-05-20 | Discharge: 2013-05-20 | Disposition: A | Discharge status: Home / Self Care | Payer: Medicare Other | Source: Ambulatory Visit | Attending: Geriatric Medicine | Admitting: Geriatric Medicine

## 2013-05-20 DIAGNOSIS — R05 Cough: Secondary | ICD-10-CM

## 2013-05-20 DIAGNOSIS — R059 Cough, unspecified: Secondary | ICD-10-CM

## 2013-06-17 ENCOUNTER — Ambulatory Visit
Admission: RE | Admit: 2013-06-17 | Discharge: 2013-06-17 | Disposition: A | Discharge status: Home / Self Care | Payer: Medicare Other | Source: Ambulatory Visit | Attending: Geriatric Medicine | Admitting: Geriatric Medicine

## 2013-06-17 ENCOUNTER — Other Ambulatory Visit: Payer: Self-pay | Admitting: Geriatric Medicine

## 2013-06-17 DIAGNOSIS — R05 Cough: Secondary | ICD-10-CM

## 2013-06-17 DIAGNOSIS — R059 Cough, unspecified: Secondary | ICD-10-CM

## 2013-07-15 ENCOUNTER — Ambulatory Visit: Payer: Self-pay | Admitting: Podiatry

## 2013-07-23 ENCOUNTER — Ambulatory Visit (INDEPENDENT_AMBULATORY_CARE_PROVIDER_SITE_OTHER): Payer: Medicare Other

## 2013-07-23 VITALS — BP 155/84 | HR 68 | Resp 24 | Ht 67.0 in | Wt 195.0 lb

## 2013-07-23 DIAGNOSIS — M79609 Pain in unspecified limb: Secondary | ICD-10-CM

## 2013-07-23 DIAGNOSIS — E1149 Type 2 diabetes mellitus with other diabetic neurological complication: Secondary | ICD-10-CM

## 2013-07-23 DIAGNOSIS — E1142 Type 2 diabetes mellitus with diabetic polyneuropathy: Secondary | ICD-10-CM

## 2013-07-23 DIAGNOSIS — E1140 Type 2 diabetes mellitus with diabetic neuropathy, unspecified: Secondary | ICD-10-CM

## 2013-07-23 DIAGNOSIS — Q828 Other specified congenital malformations of skin: Secondary | ICD-10-CM

## 2013-07-23 DIAGNOSIS — B351 Tinea unguium: Secondary | ICD-10-CM

## 2013-07-23 DIAGNOSIS — M2040 Other hammer toe(s) (acquired), unspecified foot: Secondary | ICD-10-CM

## 2013-07-23 NOTE — Progress Notes (Signed)
   Subjective:    Patient ID: OSHA RANE, female    DOB: October 04, 1920, 79 y.o.   MRN: [id number]  "Trim my nails."  HPI no changes medication her health history at this time.    Review of Systems deferred at this visit     Objective:   Physical Exam Neurovascular status as follows DP postal for PT plus one over 4 bilateral Refill timed 3-4 seconds all digits. Skin temperature warm turgor normal there is mild edema no rubor pallor moderate varicosities noted. Neurologically epicritic and proprioceptive sensations diminished on Semmes Weinstein testing to forefoot digits and plantar arch. Patient does have history paresthesias as well. Orthopedic biomechanical exam remarkable for digital contractures HAV deformity as well as hammertoe deformities particular fifth digits bilateral with associated keratoses pinch callus first digit IP joint bilateral as well as HD 5 bilateral. Nails thick brittle friable incurvated discolored and ingrowing. Tender on palpation with enclosed shoe wear. Patient relates with the assistance of a walker maintaining appropriate accommodative her diabetic shoes as instructed. No open wounds or ulcerations noted       Assessment & Plan:  Assessment diabetes with history peripheral neuropathy. Plan at this time debridement of dystrophic friable discolored brittle nails 1 through 5 bilateral mycotic nails debridement x10 ulcer this time debridement of keratoses HD 5 bilateral pinch callus first bilateral. Maintain accommodative shoes as instructed recheck in 3 months for continued palliative care and as-needed basis  Alvan Dame DPM

## 2013-07-23 NOTE — Patient Instructions (Signed)
Diabetes and Foot Care Diabetes may cause you to have problems because of poor blood supply (circulation) to your feet and legs. This may cause the skin on your feet to become thinner, break easier, and heal more slowly. Your skin may become dry, and the skin may peel and crack. You may also have nerve damage in your legs and feet causing decreased feeling in them. You may not notice minor injuries to your feet that could lead to infections or more serious problems. Taking care of your feet is one of the most important things you can do for yourself.  HOME CARE INSTRUCTIONS  Wear shoes at all times, even in the house. Do not go barefoot. Bare feet are easily injured.  Check your feet daily for blisters, cuts, and redness. If you cannot see the bottom of your feet, use a mirror or ask someone for help.  Wash your feet with warm water (do not use hot water) and mild soap. Then pat your feet and the areas between your toes until they are completely dry. Do not soak your feet as this can dry your skin.  Apply a moisturizing lotion or petroleum jelly (that does not contain alcohol and is unscented) to the skin on your feet and to dry, brittle toenails. Do not apply lotion between your toes.  Trim your toenails straight across. Do not dig under them or around the cuticle. File the edges of your nails with an emery board or nail file.  Do not cut corns or calluses or try to remove them with medicine.  Wear clean socks or stockings every day. Make sure they are not too tight. Do not wear knee-high stockings since they may decrease blood flow to your legs.  Wear shoes that fit properly and have enough cushioning. To break in new shoes, wear them for just a few hours a day. This prevents you from injuring your feet. Always look in your shoes before you put them on to be sure there are no objects inside.  Do not cross your legs. This may decrease the blood flow to your feet.  If you find a minor scrape,  cut, or break in the skin on your feet, keep it and the skin around it clean and dry. These areas may be cleansed with mild soap and water. Do not cleanse the area with peroxide, alcohol, or iodine.  When you remove an adhesive bandage, be sure not to damage the skin around it.  If you have a wound, look at it several times a day to make sure it is healing.  Do not use heating pads or hot water bottles. They may burn your skin. If you have lost feeling in your feet or legs, you may not know it is happening until it is too late.  Make sure your health care provider performs a complete foot exam at least annually or more often if you have foot problems. Report any cuts, sores, or bruises to your health care provider immediately. SEEK MEDICAL CARE IF:   You have an injury that is not healing.  You have cuts or breaks in the skin.  You have an ingrown nail.  You notice redness on your legs or feet.  You feel burning or tingling in your legs or feet.  You have pain or cramps in your legs and feet.  Your legs or feet are numb.  Your feet always feel cold. SEEK IMMEDIATE MEDICAL CARE IF:   There is increasing redness,   swelling, or pain in or around a wound.  There is a red line that goes up your leg.  Pus is coming from a wound.  You develop a fever or as directed by your health care provider.  You notice a bad smell coming from an ulcer or wound. Document Released: 07/28/2000 Document Revised: 04/02/2013 Document Reviewed: 01/07/2013 [employer] Patient Information 2014 [employer].  

## 2013-09-15 ENCOUNTER — Other Ambulatory Visit: Payer: Self-pay | Admitting: Endocrinology

## 2013-09-23 DIAGNOSIS — E1139 Type 2 diabetes mellitus with other diabetic ophthalmic complication: Secondary | ICD-10-CM | POA: Diagnosis not present

## 2013-09-23 DIAGNOSIS — E11311 Type 2 diabetes mellitus with unspecified diabetic retinopathy with macular edema: Secondary | ICD-10-CM | POA: Diagnosis not present

## 2013-09-23 DIAGNOSIS — E11319 Type 2 diabetes mellitus with unspecified diabetic retinopathy without macular edema: Secondary | ICD-10-CM | POA: Diagnosis not present

## 2013-10-17 ENCOUNTER — Emergency Department (HOSPITAL_BASED_OUTPATIENT_CLINIC_OR_DEPARTMENT_OTHER)
Admission: EM | Admit: 2013-10-17 | Discharge: 2013-10-17 | Disposition: A | Discharge status: Home / Self Care | Payer: Medicare Other | Attending: Emergency Medicine | Admitting: Emergency Medicine

## 2013-10-17 ENCOUNTER — Encounter (HOSPITAL_BASED_OUTPATIENT_CLINIC_OR_DEPARTMENT_OTHER): Payer: Self-pay | Admitting: Emergency Medicine

## 2013-10-17 DIAGNOSIS — N189 Chronic kidney disease, unspecified: Secondary | ICD-10-CM | POA: Diagnosis not present

## 2013-10-17 DIAGNOSIS — M1990 Unspecified osteoarthritis, unspecified site: Secondary | ICD-10-CM | POA: Insufficient documentation

## 2013-10-17 DIAGNOSIS — Z79899 Other long term (current) drug therapy: Secondary | ICD-10-CM | POA: Insufficient documentation

## 2013-10-17 DIAGNOSIS — I129 Hypertensive chronic kidney disease with stage 1 through stage 4 chronic kidney disease, or unspecified chronic kidney disease: Secondary | ICD-10-CM | POA: Diagnosis not present

## 2013-10-17 DIAGNOSIS — Z8701 Personal history of pneumonia (recurrent): Secondary | ICD-10-CM | POA: Diagnosis not present

## 2013-10-17 DIAGNOSIS — Y92009 Unspecified place in unspecified non-institutional (private) residence as the place of occurrence of the external cause: Secondary | ICD-10-CM | POA: Insufficient documentation

## 2013-10-17 DIAGNOSIS — I509 Heart failure, unspecified: Secondary | ICD-10-CM | POA: Diagnosis not present

## 2013-10-17 DIAGNOSIS — H9190 Unspecified hearing loss, unspecified ear: Secondary | ICD-10-CM | POA: Diagnosis not present

## 2013-10-17 DIAGNOSIS — S91109A Unspecified open wound of unspecified toe(s) without damage to nail, initial encounter: Secondary | ICD-10-CM | POA: Diagnosis not present

## 2013-10-17 DIAGNOSIS — Z8719 Personal history of other diseases of the digestive system: Secondary | ICD-10-CM | POA: Insufficient documentation

## 2013-10-17 DIAGNOSIS — S91112A Laceration without foreign body of left great toe without damage to nail, initial encounter: Secondary | ICD-10-CM

## 2013-10-17 DIAGNOSIS — E119 Type 2 diabetes mellitus without complications: Secondary | ICD-10-CM | POA: Diagnosis not present

## 2013-10-17 DIAGNOSIS — Y9301 Activity, walking, marching and hiking: Secondary | ICD-10-CM | POA: Insufficient documentation

## 2013-10-17 DIAGNOSIS — I10 Essential (primary) hypertension: Secondary | ICD-10-CM | POA: Diagnosis not present

## 2013-10-17 DIAGNOSIS — W2209XA Striking against other stationary object, initial encounter: Secondary | ICD-10-CM | POA: Insufficient documentation

## 2013-10-17 NOTE — ED Provider Notes (Signed)
CSN: [id number]     Arrival date & time 10/17/13  2019 History  This chart was scribed for Houston Siren III, * by Einar Pheasant, ED Scribe. This patient was seen in room MH06/MH06 and the patient's care was started at 9:00 PM.    Chief Complaint  Patient presents with  . Foot Injury    Patient is a 80 y.o. female presenting with foot injury. The history is provided by the patient. No language interpreter was used.  Foot Injury Location:  Toe (left big toe) Time since incident:  6 hours Injury: yes   Toe location:  L big toe Pain details:    Radiates to:  Does not radiate   Severity:  Mild   Onset quality:  Sudden   Progression:  Unchanged Chronicity:  New Dislocation: no   Foreign body present:  No foreign bodies  HPI Comments: Heidi Dominguez is a 80 y.o. Female with a history of DM, presents to the Emergency Department complaining of a left foot injury that occurred 5.5 hours ago. Pt states that she was walking through her home when she hit her left foot on a door. She is also complaining of associated soreness. Denies any fall or LOC. Denies any fever, chills, diaphoresis, or paraesthesia.   Past Medical History  Diagnosis Date  . Diabetes mellitus   . Hypertension   . High cholesterol   . Osteoarthritis   . Degenerative joint disease of shoulder region   . HOH (hard of hearing)   . H/O knee surgery   . H/O shoulder surgery   . Complication of anesthesia     I have a hard time waking up"  . CHF (congestive heart failure)   . Pneumonia   . GERD (gastroesophageal reflux disease)   . Chronic kidney disease     incontinence/ fAMILYN DENIES HX OF CKD   Past Surgical History  Procedure Laterality Date  . Shoulder surgery    . Knee surgery    . Back surgery    . Abdominal hysterectomy    . Cataracts      bilateral   No family history on file. History  Substance Use Topics  . Smoking status: Never Smoker   . Smokeless tobacco: Never Used  . Alcohol Use: No    OB History   Grav Para Term Preterm Abortions TAB SAB Ect Mult Living                 Review of Systems  All other systems reviewed and are negative.      Allergies  Catapres; Covera-hs; Other; and Sulfa antibiotics  Home Medications   Current Outpatient Rx  Name  Route  Sig  Dispense  Refill  . acetaminophen (TYLENOL) 325 MG tablet   Oral   Take 2 tablets (650 mg total) by mouth every 6 (six) hours as needed (or Fever >/= 101).         Marland Kitchen amLODipine-benazepril (LOTREL) 5-20 MG per capsule      TAKE 1 CAPSULE BY MOUTH DAILY   30 capsule   5   . cefUROXime (CEFTIN) 250 MG tablet   Oral   Take 1 tablet (250 mg total) by mouth 2 (two) times daily with a meal.   2 tablet      . Cholecalciferol (VITAMIN D3) 5000 UNITS CAPS   Oral   Take 1 capsule by mouth daily.         Marland Kitchen darifenacin (ENABLEX) 7.5 MG  24 hr tablet   Oral   Take 7.5 mg by mouth daily.         . Ferrous Gluconate (IRON) 240 (27 FE) MG TABS   Oral   Take 1 tablet by mouth daily.         . fish oil-omega-3 fatty acids 1000 MG capsule   Oral   Take 1 g by mouth daily.         Marland Kitchen glimepiride (AMARYL) 1 MG tablet   Oral   Take 1 mg by mouth daily with breakfast.         . labetalol (NORMODYNE) 200 MG tablet   Oral   Take 200 mg by mouth 2 (two) times daily.         . Magnesium 250 MG TABS   Oral   Take 1 tablet by mouth daily.         . pravastatin (PRAVACHOL) 40 MG tablet   Oral   Take 40 mg by mouth daily.         Marland Kitchen EXPIRED: ranitidine (ZANTAC) 150 MG tablet   Oral   Take 1 tablet (150 mg total) by mouth 2 (two) times daily.   60 tablet   1   . sitaGLIPtin (JANUVIA) 100 MG tablet   Oral   Take 100 mg by mouth daily.         . vitamin B-12 (CYANOCOBALAMIN) 500 MCG tablet   Oral   Take 500 mcg by mouth daily.          BP 195/74  Pulse 77  Temp(Src) 99 F (37.2 C) (Oral)  Resp 20  Ht 5\' 7"  (1.702 m)  Wt 199 lb (90.266 kg)  BMI 31.16 kg/m2  SpO2  95%  Physical Exam  Nursing note and vitals reviewed. Constitutional: She is oriented to person, place, and time. She appears well-developed and well-nourished. No distress.  HENT:  Head: Normocephalic and atraumatic.  Eyes: Conjunctivae are normal. No scleral icterus.  Neck: Neck supple.  Cardiovascular: Normal rate and intact distal pulses.   Pulmonary/Chest: Effort normal. No stridor. No respiratory distress.  Abdominal: Normal appearance. She exhibits no distension.  Neurological: She is alert and oriented to person, place, and time.  Skin: Skin is warm and dry. No rash noted.  2 cm curved, flaplike laceration to lateral aspect of left great toe.    Psychiatric: She has a normal mood and affect. Her behavior is normal.    ED Course  LACERATION REPAIR Date/Time: 10/18/2013 12:13 AM Performed by: Serita Grit DAVID III Authorized by: Serita Grit DAVID III Consent: Verbal consent obtained. Risks and benefits: risks, benefits and alternatives were discussed Consent given by: patient Body area: lower extremity Location details: left big toe Laceration length: 2 cm Foreign bodies: no foreign bodies Tendon involvement: none Nerve involvement: none Vascular damage: no Preparation: Patient was prepped and draped in the usual sterile fashion. Irrigation solution: saline Irrigation method: jet lavage Amount of cleaning: extensive Debridement: none Degree of undermining: none Skin closure: 5-0 Prolene Number of sutures: 1 Technique: simple Approximation: close Approximation difficulty: simple Dressing: antibiotic ointment Patient tolerance: Patient tolerated the procedure well with no immediate complications.   (including critical care time)  DIAGNOSTIC STUDIES: Oxygen Saturation is 95% on RA, adequate by my interpretation.    COORDINATION OF CARE: 9:05 PM- Advised pt to apply proper wound care. Pt advised of plan for treatment and pt agrees.  Labs Review Labs  Reviewed - No data to display  Imaging Review No results found.   EKG Interpretation None      MDM   Final diagnoses:  Laceration of great toe, left    Well appearing 80 year old with a left toe laceration. History of diabetes. Wound appears clean, was irrigated prior to arrival, was irrigated again and the emergency department.  Placed 1 suture, to loosely close the wound. She will followup closely with her podiatrist and primary doctor. Return precautions given for signs of infection  I personally performed the services described in this documentation, which was scribed in my presence. The recorded information has been reviewed and is accurate.     Houston Siren III, MD 10/18/13 [phone]

## 2013-10-17 NOTE — Discharge Instructions (Signed)

## 2013-10-17 NOTE — ED Notes (Signed)
Cut left foot on door lock approx 330pm

## 2013-10-21 ENCOUNTER — Ambulatory Visit (INDEPENDENT_AMBULATORY_CARE_PROVIDER_SITE_OTHER): Payer: Medicare Other

## 2013-10-21 VITALS — BP 184/98 | HR 69 | Resp 16

## 2013-10-21 DIAGNOSIS — E1140 Type 2 diabetes mellitus with diabetic neuropathy, unspecified: Secondary | ICD-10-CM

## 2013-10-21 DIAGNOSIS — M79609 Pain in unspecified limb: Secondary | ICD-10-CM | POA: Diagnosis not present

## 2013-10-21 DIAGNOSIS — B351 Tinea unguium: Secondary | ICD-10-CM

## 2013-10-21 DIAGNOSIS — E1142 Type 2 diabetes mellitus with diabetic polyneuropathy: Secondary | ICD-10-CM | POA: Diagnosis not present

## 2013-10-21 NOTE — Progress Notes (Signed)
   Subjective:    Patient ID: Heidi Dominguez, female    DOB: April 08, 1921, 80 y.o.   MRN: [id number]  HPI Comments: "She needs the toenails cut, but she had a cut on this toe and the ER stitched her up. Just need to see if this is ready to come out"  Patient states that she injured her left hallux medial side on Friday. She had a small cut and reported to the ER and they stitched the area up. She has one suture intact currently and wants to see if ready to remove. Been covering with band aid.     Review of Systems  Respiratory: Positive for cough.   Musculoskeletal: Positive for gait problem.  Neurological: Positive for headaches.  All other systems reviewed and are negative.       Objective:   Physical Exam Neurovascular status is intact pedal pulses palpable DP +2/4 bilateral PT one over 4 bilateral Refill time 4 seconds. Epicritic and proprioceptive sensations intact although diminished on [employer] testing. Patient thinks that her sugars pelvis is a blood pressure any associated with stress this toe the other day having laceration when she stepped on the door jam that have a sharp edge caused a laceration to her left hallux plantar medial aspect this happened this past Friday proxy 5 days ago at this time exam reveals appears to be well coapted single nylon sutures removed at this time per patient request will maintain Neosporin and Band-Aid dressing which is applied at this time. Nails thick brittle friable discolored tender and painful secondary diabetic neuropathy and angiopathy. There no open wounds ulcerations no secondary keratoses no infection is noted current time       Assessment & Plan:  Assessment this time laceration contusion left hallux suture removal at this time maintain Neosporin and Band-Aid dressing for leaks 57 days. Assessment #2 is diabetes with peripheral neuropathy debridement of dystrophic friable gratified brittle nails 1 through 5 bilateral with  onychomycosis of the foot dystrophy the nails are debrided return for future palliative mycotic nail care and as-needed basis suggest 3 month followup next  [employer]

## 2013-10-21 NOTE — Patient Instructions (Signed)
Diabetes and Foot Care Diabetes may cause you to have problems because of poor blood supply (circulation) to your feet and legs. This may cause the skin on your feet to become thinner, break easier, and heal more slowly. Your skin may become dry, and the skin may peel and crack. You may also have nerve damage in your legs and feet causing decreased feeling in them. You may not notice minor injuries to your feet that could lead to infections or more serious problems. Taking care of your feet is one of the most important things you can do for yourself.  HOME CARE INSTRUCTIONS  Wear shoes at all times, even in the house. Do not go barefoot. Bare feet are easily injured.  Check your feet daily for blisters, cuts, and redness. If you cannot see the bottom of your feet, use a mirror or ask someone for help.  Wash your feet with warm water (do not use hot water) and mild soap. Then pat your feet and the areas between your toes until they are completely dry. Do not soak your feet as this can dry your skin.  Apply a moisturizing lotion or petroleum jelly (that does not contain alcohol and is unscented) to the skin on your feet and to dry, brittle toenails. Do not apply lotion between your toes.  Trim your toenails straight across. Do not dig under them or around the cuticle. File the edges of your nails with an emery board or nail file.  Do not cut corns or calluses or try to remove them with medicine.  Wear clean socks or stockings every day. Make sure they are not too tight. Do not wear knee-high stockings since they may decrease blood flow to your legs.  Wear shoes that fit properly and have enough cushioning. To break in new shoes, wear them for just a few hours a day. This prevents you from injuring your feet. Always look in your shoes before you put them on to be sure there are no objects inside.  Do not cross your legs. This may decrease the blood flow to your feet.  If you find a minor scrape,  cut, or break in the skin on your feet, keep it and the skin around it clean and dry. These areas may be cleansed with mild soap and water. Do not cleanse the area with peroxide, alcohol, or iodine.  When you remove an adhesive bandage, be sure not to damage the skin around it.  If you have a wound, look at it several times a day to make sure it is healing.  Do not use heating pads or hot water bottles. They may burn your skin. If you have lost feeling in your feet or legs, you may not know it is happening until it is too late.  Make sure your health care provider performs a complete foot exam at least annually or more often if you have foot problems. Report any cuts, sores, or bruises to your health care provider immediately. SEEK MEDICAL CARE IF:   You have an injury that is not healing.  You have cuts or breaks in the skin.  You have an ingrown nail.  You notice redness on your legs or feet.  You feel burning or tingling in your legs or feet.  You have pain or cramps in your legs and feet.  Your legs or feet are numb.  Your feet always feel cold. SEEK IMMEDIATE MEDICAL CARE IF:   There is increasing redness,   swelling, or pain in or around a wound.  There is a red line that goes up your leg.  Pus is coming from a wound.  You develop a fever or as directed by your health care provider.  You notice a bad smell coming from an ulcer or wound. Document Released: 07/28/2000 Document Revised: 04/02/2013 Document Reviewed: 01/07/2013 [employer] Patient Information 2014 [employer].  

## 2013-11-18 ENCOUNTER — Other Ambulatory Visit: Payer: Self-pay | Admitting: Geriatric Medicine

## 2013-11-18 ENCOUNTER — Ambulatory Visit
Admission: RE | Admit: 2013-11-18 | Discharge: 2013-11-18 | Disposition: A | Discharge status: Home / Self Care | Payer: Medicare Other | Source: Ambulatory Visit | Attending: Geriatric Medicine | Admitting: Geriatric Medicine

## 2013-11-18 DIAGNOSIS — K449 Diaphragmatic hernia without obstruction or gangrene: Secondary | ICD-10-CM | POA: Diagnosis not present

## 2013-11-18 DIAGNOSIS — Z79899 Other long term (current) drug therapy: Secondary | ICD-10-CM | POA: Diagnosis not present

## 2013-11-18 DIAGNOSIS — Z23 Encounter for immunization: Secondary | ICD-10-CM | POA: Diagnosis not present

## 2013-11-18 DIAGNOSIS — Z0000 Encounter for general adult medical examination without abnormal findings: Secondary | ICD-10-CM | POA: Diagnosis not present

## 2013-11-18 DIAGNOSIS — E7800 Pure hypercholesterolemia, unspecified: Secondary | ICD-10-CM | POA: Diagnosis not present

## 2013-11-18 DIAGNOSIS — E1149 Type 2 diabetes mellitus with other diabetic neurological complication: Secondary | ICD-10-CM | POA: Diagnosis not present

## 2013-11-18 DIAGNOSIS — Z1331 Encounter for screening for depression: Secondary | ICD-10-CM | POA: Diagnosis not present

## 2013-11-18 DIAGNOSIS — J309 Allergic rhinitis, unspecified: Secondary | ICD-10-CM | POA: Diagnosis not present

## 2013-11-18 DIAGNOSIS — R062 Wheezing: Secondary | ICD-10-CM

## 2013-11-18 DIAGNOSIS — R011 Cardiac murmur, unspecified: Secondary | ICD-10-CM | POA: Diagnosis not present

## 2013-11-18 DIAGNOSIS — D649 Anemia, unspecified: Secondary | ICD-10-CM | POA: Diagnosis not present

## 2013-11-19 DIAGNOSIS — I369 Nonrheumatic tricuspid valve disorder, unspecified: Secondary | ICD-10-CM | POA: Diagnosis not present

## 2013-11-19 DIAGNOSIS — I059 Rheumatic mitral valve disease, unspecified: Secondary | ICD-10-CM | POA: Diagnosis not present

## 2013-12-09 DIAGNOSIS — I10 Essential (primary) hypertension: Secondary | ICD-10-CM | POA: Diagnosis not present

## 2013-12-09 DIAGNOSIS — M25519 Pain in unspecified shoulder: Secondary | ICD-10-CM | POA: Diagnosis not present

## 2013-12-09 DIAGNOSIS — E119 Type 2 diabetes mellitus without complications: Secondary | ICD-10-CM | POA: Diagnosis not present

## 2013-12-09 DIAGNOSIS — R197 Diarrhea, unspecified: Secondary | ICD-10-CM | POA: Diagnosis not present

## 2013-12-18 DIAGNOSIS — E11319 Type 2 diabetes mellitus with unspecified diabetic retinopathy without macular edema: Secondary | ICD-10-CM | POA: Diagnosis not present

## 2013-12-18 DIAGNOSIS — H04129 Dry eye syndrome of unspecified lacrimal gland: Secondary | ICD-10-CM | POA: Diagnosis not present

## 2013-12-18 DIAGNOSIS — E11311 Type 2 diabetes mellitus with unspecified diabetic retinopathy with macular edema: Secondary | ICD-10-CM | POA: Diagnosis not present

## 2013-12-18 DIAGNOSIS — E1139 Type 2 diabetes mellitus with other diabetic ophthalmic complication: Secondary | ICD-10-CM | POA: Diagnosis not present

## 2013-12-24 DIAGNOSIS — R197 Diarrhea, unspecified: Secondary | ICD-10-CM | POA: Diagnosis not present

## 2013-12-25 DIAGNOSIS — M25519 Pain in unspecified shoulder: Secondary | ICD-10-CM | POA: Diagnosis not present

## 2013-12-25 DIAGNOSIS — E119 Type 2 diabetes mellitus without complications: Secondary | ICD-10-CM | POA: Diagnosis not present

## 2013-12-25 DIAGNOSIS — E669 Obesity, unspecified: Secondary | ICD-10-CM | POA: Diagnosis not present

## 2013-12-25 DIAGNOSIS — I10 Essential (primary) hypertension: Secondary | ICD-10-CM | POA: Diagnosis not present

## 2014-02-01 DIAGNOSIS — R609 Edema, unspecified: Secondary | ICD-10-CM | POA: Diagnosis not present

## 2014-02-02 DIAGNOSIS — E11311 Type 2 diabetes mellitus with unspecified diabetic retinopathy with macular edema: Secondary | ICD-10-CM | POA: Diagnosis not present

## 2014-02-02 DIAGNOSIS — E1139 Type 2 diabetes mellitus with other diabetic ophthalmic complication: Secondary | ICD-10-CM | POA: Diagnosis not present

## 2014-02-03 ENCOUNTER — Ambulatory Visit: Payer: Medicare Other

## 2014-02-03 DIAGNOSIS — E1139 Type 2 diabetes mellitus with other diabetic ophthalmic complication: Secondary | ICD-10-CM | POA: Diagnosis not present

## 2014-02-03 DIAGNOSIS — H353290 Exudative age-related macular degeneration, unspecified eye, stage unspecified: Secondary | ICD-10-CM | POA: Diagnosis not present

## 2014-02-03 DIAGNOSIS — E11359 Type 2 diabetes mellitus with proliferative diabetic retinopathy without macular edema: Secondary | ICD-10-CM | POA: Diagnosis not present

## 2014-02-24 DIAGNOSIS — R197 Diarrhea, unspecified: Secondary | ICD-10-CM | POA: Diagnosis not present

## 2014-02-26 DIAGNOSIS — R197 Diarrhea, unspecified: Secondary | ICD-10-CM | POA: Diagnosis not present

## 2014-03-11 DIAGNOSIS — H353290 Exudative age-related macular degeneration, unspecified eye, stage unspecified: Secondary | ICD-10-CM | POA: Diagnosis not present

## 2014-03-13 ENCOUNTER — Other Ambulatory Visit: Payer: Self-pay | Admitting: Nurse Practitioner

## 2014-03-13 ENCOUNTER — Ambulatory Visit
Admission: RE | Admit: 2014-03-13 | Discharge: 2014-03-13 | Disposition: A | Discharge status: Home / Self Care | Payer: Medicare Other | Source: Ambulatory Visit | Attending: Nurse Practitioner | Admitting: Nurse Practitioner

## 2014-03-13 DIAGNOSIS — J40 Bronchitis, not specified as acute or chronic: Secondary | ICD-10-CM

## 2014-03-18 DIAGNOSIS — J40 Bronchitis, not specified as acute or chronic: Secondary | ICD-10-CM | POA: Diagnosis not present

## 2014-03-20 ENCOUNTER — Ambulatory Visit (INDEPENDENT_AMBULATORY_CARE_PROVIDER_SITE_OTHER): Payer: Medicare Other

## 2014-03-20 VITALS — BP 146/79 | HR 65 | Resp 16

## 2014-03-20 DIAGNOSIS — M79606 Pain in leg, unspecified: Secondary | ICD-10-CM

## 2014-03-20 DIAGNOSIS — E1140 Type 2 diabetes mellitus with diabetic neuropathy, unspecified: Secondary | ICD-10-CM

## 2014-03-20 DIAGNOSIS — E1142 Type 2 diabetes mellitus with diabetic polyneuropathy: Secondary | ICD-10-CM

## 2014-03-20 DIAGNOSIS — Q828 Other specified congenital malformations of skin: Secondary | ICD-10-CM | POA: Diagnosis not present

## 2014-03-20 DIAGNOSIS — E1149 Type 2 diabetes mellitus with other diabetic neurological complication: Secondary | ICD-10-CM

## 2014-03-20 DIAGNOSIS — M79609 Pain in unspecified limb: Secondary | ICD-10-CM | POA: Diagnosis not present

## 2014-03-20 DIAGNOSIS — B351 Tinea unguium: Secondary | ICD-10-CM

## 2014-03-20 DIAGNOSIS — M2040 Other hammer toe(s) (acquired), unspecified foot: Secondary | ICD-10-CM

## 2014-03-20 NOTE — Progress Notes (Signed)
   Subjective:    Patient ID: Heidi Dominguez, female    DOB: [birth year], 80 y.o.   MRN: [id number]  HPI patient presents at this time for diabetic foot and nail care as well as requesting new diabetic shoes in the future.    Review of Systems no new findings or systemic changes in a     Objective:   Physical Exam Lower extremity objective findings as follows patient does have masker status is intact pedal pulses DP plus one over 4 PT one over 4 bilateral somewhat decreased on Semmes Weinstein testing of epicritic sensation forefoot digits arch and ankles there is normal plantar response DTRs not listed. Capillary refill time 3 seconds mild edema both ankles noted as well. Neurologically skin color pigment normal hair growth absent nails thick brittle crumbly friable dystrophic 1 through 5 bilateral to cut the with enclosed shoes current diabetic shoes are worn need replacing the near future. No ulcerations no secondary infections current time.       Assessment & Plan:  Assessment diabetes with history of neuropathy and angiopathy decreased neurovascular status noted no active ulcers or infections however nails thick brittle crumbly friable dystrophic or debridement x10 the presence of diabetes and onychomycosis and pain in symptomology. Also single keratotic lesion second hammertoe fifth digit right foot HD 5 is debrided this is treated with lumicain and Neosporin for slight pinpoint bleeding following debridement patient will be scheduled her can be is for functional her diabetic accident shoes and [employer] impressions was authorization for extra-depth shoes obtained from primary physician. Return in 3 months for followup continued palliative care in the future  Harriet Masson DPM

## 2014-03-20 NOTE — Patient Instructions (Signed)
Diabetes and Foot Care Diabetes may cause you to have problems because of poor blood supply (circulation) to your feet and legs. This may cause the skin on your feet to become thinner, break easier, and heal more slowly. Your skin may become dry, and the skin may peel and crack. You may also have nerve damage in your legs and feet causing decreased feeling in them. You may not notice minor injuries to your feet that could lead to infections or more serious problems. Taking care of your feet is one of the most important things you can do for yourself.  HOME CARE INSTRUCTIONS  Wear shoes at all times, even in the house. Do not go barefoot. Bare feet are easily injured.  Check your feet daily for blisters, cuts, and redness. If you cannot see the bottom of your feet, use a mirror or ask someone for help.  Wash your feet with warm water (do not use hot water) and mild soap. Then pat your feet and the areas between your toes until they are completely dry. Do not soak your feet as this can dry your skin.  Apply a moisturizing lotion or petroleum jelly (that does not contain alcohol and is unscented) to the skin on your feet and to dry, brittle toenails. Do not apply lotion between your toes.  Trim your toenails straight across. Do not dig under them or around the cuticle. File the edges of your nails with an emery board or nail file.  Do not cut corns or calluses or try to remove them with medicine.  Wear clean socks or stockings every day. Make sure they are not too tight. Do not wear knee-high stockings since they may decrease blood flow to your legs.  Wear shoes that fit properly and have enough cushioning. To break in new shoes, wear them for just a few hours a day. This prevents you from injuring your feet. Always look in your shoes before you put them on to be sure there are no objects inside.  Do not cross your legs. This may decrease the blood flow to your feet.  If you find a minor scrape,  cut, or break in the skin on your feet, keep it and the skin around it clean and dry. These areas may be cleansed with mild soap and water. Do not cleanse the area with peroxide, alcohol, or iodine.  When you remove an adhesive bandage, be sure not to damage the skin around it.  If you have a wound, look at it several times a day to make sure it is healing.  Do not use heating pads or hot water bottles. They may burn your skin. If you have lost feeling in your feet or legs, you may not know it is happening until it is too late.  Make sure your health care provider performs a complete foot exam at least annually or more often if you have foot problems. Report any cuts, sores, or bruises to your health care provider immediately. SEEK MEDICAL CARE IF:   You have an injury that is not healing.  You have cuts or breaks in the skin.  You have an ingrown nail.  You notice redness on your legs or feet.  You feel burning or tingling in your legs or feet.  You have pain or cramps in your legs and feet.  Your legs or feet are numb.  Your feet always feel cold. SEEK IMMEDIATE MEDICAL CARE IF:   There is increasing redness,   swelling, or pain in or around a wound.  There is a red line that goes up your leg.  Pus is coming from a wound.  You develop a fever or as directed by your health care provider.  You notice a bad smell coming from an ulcer or wound. Document Released: 07/28/2000 Document Revised: 04/02/2013 Document Reviewed: 01/07/2013 Exit[employer] Patient Information 2015 [employer]. This information is not intended to replace advice given to you by your health care provider. Make sure you discuss any questions you have with your health care provider.  

## 2014-03-24 DIAGNOSIS — J40 Bronchitis, not specified as acute or chronic: Secondary | ICD-10-CM | POA: Diagnosis not present

## 2014-04-01 DIAGNOSIS — R197 Diarrhea, unspecified: Secondary | ICD-10-CM | POA: Diagnosis not present

## 2014-04-16 DIAGNOSIS — H353290 Exudative age-related macular degeneration, unspecified eye, stage unspecified: Secondary | ICD-10-CM | POA: Diagnosis not present

## 2014-04-23 DIAGNOSIS — N318 Other neuromuscular dysfunction of bladder: Secondary | ICD-10-CM | POA: Diagnosis not present

## 2014-04-23 DIAGNOSIS — N390 Urinary tract infection, site not specified: Secondary | ICD-10-CM | POA: Diagnosis not present

## 2014-05-01 ENCOUNTER — Ambulatory Visit (INDEPENDENT_AMBULATORY_CARE_PROVIDER_SITE_OTHER): Payer: Medicare Other | Admitting: *Deleted

## 2014-05-01 DIAGNOSIS — E1140 Type 2 diabetes mellitus with diabetic neuropathy, unspecified: Secondary | ICD-10-CM

## 2014-05-01 DIAGNOSIS — E1149 Type 2 diabetes mellitus with other diabetic neurological complication: Secondary | ICD-10-CM

## 2014-05-01 DIAGNOSIS — E1142 Type 2 diabetes mellitus with diabetic polyneuropathy: Secondary | ICD-10-CM

## 2014-05-14 DIAGNOSIS — H3532 Exudative age-related macular degeneration: Secondary | ICD-10-CM | POA: Diagnosis not present

## 2014-05-25 DIAGNOSIS — R159 Full incontinence of feces: Secondary | ICD-10-CM | POA: Diagnosis not present

## 2014-05-25 DIAGNOSIS — R195 Other fecal abnormalities: Secondary | ICD-10-CM | POA: Diagnosis not present

## 2014-05-25 DIAGNOSIS — R197 Diarrhea, unspecified: Secondary | ICD-10-CM | POA: Diagnosis not present

## 2014-06-09 DIAGNOSIS — E780 Pure hypercholesterolemia: Secondary | ICD-10-CM | POA: Diagnosis not present

## 2014-06-09 DIAGNOSIS — E1121 Type 2 diabetes mellitus with diabetic nephropathy: Secondary | ICD-10-CM | POA: Diagnosis not present

## 2014-06-09 DIAGNOSIS — E11329 Type 2 diabetes mellitus with mild nonproliferative diabetic retinopathy without macular edema: Secondary | ICD-10-CM | POA: Diagnosis not present

## 2014-06-09 DIAGNOSIS — I340 Nonrheumatic mitral (valve) insufficiency: Secondary | ICD-10-CM | POA: Diagnosis not present

## 2014-06-09 DIAGNOSIS — F5101 Primary insomnia: Secondary | ICD-10-CM | POA: Diagnosis not present

## 2014-06-09 DIAGNOSIS — I10 Essential (primary) hypertension: Secondary | ICD-10-CM | POA: Diagnosis not present

## 2014-06-11 DIAGNOSIS — Z1231 Encounter for screening mammogram for malignant neoplasm of breast: Secondary | ICD-10-CM | POA: Diagnosis not present

## 2014-06-16 DIAGNOSIS — H3532 Exudative age-related macular degeneration: Secondary | ICD-10-CM | POA: Diagnosis not present

## 2014-06-17 ENCOUNTER — Emergency Department (INDEPENDENT_AMBULATORY_CARE_PROVIDER_SITE_OTHER)
Admission: EM | Admit: 2014-06-17 | Discharge: 2014-06-17 | Disposition: A | Discharge status: Home / Self Care | Payer: Medicare Other | Source: Home / Self Care | Attending: Emergency Medicine | Admitting: Emergency Medicine

## 2014-06-17 ENCOUNTER — Encounter (HOSPITAL_COMMUNITY): Payer: Self-pay | Admitting: Emergency Medicine

## 2014-06-17 DIAGNOSIS — R197 Diarrhea, unspecified: Secondary | ICD-10-CM | POA: Diagnosis not present

## 2014-06-17 LAB — POCT URINALYSIS DIP (DEVICE)
BILIRUBIN URINE: NEGATIVE
GLUCOSE, UA: NEGATIVE mg/dL
KETONES UR: NEGATIVE mg/dL
Nitrite: NEGATIVE
PROTEIN: NEGATIVE mg/dL
Specific Gravity, Urine: 1.01 (ref 1.005–1.030)
Urobilinogen, UA: 0.2 mg/dL (ref 0.0–1.0)
pH: 5.5 (ref 5.0–8.0)

## 2014-06-17 NOTE — ED Provider Notes (Signed)
CSN: [id number]     Arrival date & time 06/17/14  1920 History   First MD Initiated Contact with Patient 06/17/14 1940     Chief Complaint  Patient presents with  . Abdominal Pain   (Consider location/radiation/quality/duration/timing/severity/associated sxs/prior Treatment) HPI She is a 80 year old woman here with her son and caregiver for diarrhea. She states she has had intermittent diarrhea for the last 2 months. It occurs several times a week. When it does occur it is associated with abdominal cramping, nausea, feeling weak. The stool is described as loose to watery and dark green in color. They deny any black tarry stools or bright red stools. They have removed dairy products and decreased oil in her diet, which seemed to help temporarily. She thinks these episodes may be triggered by eating eggs. She is able to eat and drink normally. She denies any dizziness. No chest pain or shortness of breath. No urinary symptoms. No fevers or chills. She was recently restarted on Januvia, but this was after her symptoms started.  She has an appointment on November 16 with [facility].  Past Medical History  Diagnosis Date  . Diabetes mellitus   . Hypertension   . High cholesterol   . Osteoarthritis   . Degenerative joint disease of shoulder region   . HOH (hard of hearing)   . H/O knee surgery   . H/O shoulder surgery   . Complication of anesthesia     I have a hard time waking up"  . CHF (congestive heart failure)   . Pneumonia   . GERD (gastroesophageal reflux disease)   . Chronic kidney disease     incontinence/ fAMILYN DENIES HX OF CKD   Past Surgical History  Procedure Laterality Date  . Shoulder surgery    . Knee surgery    . Back surgery    . Abdominal hysterectomy    . Cataracts      bilateral   No family history on file. History  Substance Use Topics  . Smoking status: Never Smoker   . Smokeless tobacco: Never Used  . Alcohol Use: No   OB History    No data available     Review of Systems  Constitutional: Positive for fatigue. Negative for fever and chills.  Respiratory: Negative.   Cardiovascular: Negative.   Gastrointestinal: Positive for nausea, abdominal pain and diarrhea. Negative for vomiting, constipation and blood in stool.  Genitourinary: Negative for dysuria.  Neurological: Negative for dizziness.    Allergies  Catapres; Covera-hs; Other; and Sulfa antibiotics  Home Medications   Prior to Admission medications   Medication Sig Start Date End Date Taking? Authorizing Provider  acetaminophen (TYLENOL) 325 MG tablet Take 2 tablets (650 mg total) by mouth every 6 (six) hours as needed (or Fever >/= 101). 08/12/12   Nita Sells, MD  amLODipine-benazepril (LOTREL) 5-20 MG per capsule TAKE 1 CAPSULE BY MOUTH DAILY 04/08/13   Elayne Snare, MD  aspirin 81 MG tablet Take 81 mg by mouth daily.    Historical Provider, MD  benzonatate (TESSALON) 100 MG capsule Take by mouth 3 (three) times daily as needed for cough.    Historical Provider, MD  cefUROXime (CEFTIN) 250 MG tablet Take 1 tablet (250 mg total) by mouth 2 (two) times daily with a meal. 08/12/12   Nita Sells, MD  Cholecalciferol (VITAMIN D3) 5000 UNITS CAPS Take 1 capsule by mouth daily.    Historical Provider, MD  ciprofloxacin (CIPRO) 500 MG tablet Take 500 mg by  mouth 2 (two) times daily.    Historical Provider, MD  Ferrous Gluconate (IRON) 240 (27 FE) MG TABS Take 1 tablet by mouth daily.    Historical Provider, MD  fish oil-omega-3 fatty acids 1000 MG capsule Take 1 g by mouth daily.    Historical Provider, MD  labetalol (NORMODYNE) 200 MG tablet Take 200 mg by mouth 2 (two) times daily.    Historical Provider, MD  levalbuterol Penne Lash) 1.25 MG/3ML nebulizer solution Take 1.25 mg by nebulization every 4 (four) hours as needed for wheezing.    Historical Provider, MD  Magnesium 250 MG TABS Take 1 tablet by mouth daily.    Historical Provider, MD  mirabegron ER (MYRBETRIQ) 50  MG TB24 tablet Take 50 mg by mouth daily.    Historical Provider, MD  pravastatin (PRAVACHOL) 40 MG tablet Take 40 mg by mouth daily.    Historical Provider, MD  ranitidine (ZANTAC) 150 MG tablet Take 1 tablet (150 mg total) by mouth 2 (two) times daily. 02/29/12 02/28/13  Birdie Riddle, MD  sitaGLIPtin (JANUVIA) 100 MG tablet Take 100 mg by mouth daily.    Historical Provider, MD  vitamin B-12 (CYANOCOBALAMIN) 500 MCG tablet Take 500 mcg by mouth daily.    Historical Provider, MD   There were no vitals taken for this visit. Physical Exam  Constitutional: She is oriented to person, place, and time. She appears well-developed and well-nourished. No distress.  HENT:  Head: Normocephalic and atraumatic.  Cardiovascular: Normal rate and regular rhythm.   Murmur (2/6 systolic murmurs) heard. Pulmonary/Chest: Effort normal and breath sounds normal. No respiratory distress. She has no wheezes. She has no rales.  Abdominal: Soft. Bowel sounds are normal. She exhibits no distension. There is no tenderness. There is no rebound and no guarding.  Neurological: She is alert and oriented to person, place, and time.    ED Course  Procedures (including critical care time) Labs Review Labs Reviewed  POCT URINALYSIS DIP (DEVICE) - Abnormal; Notable for the following:    Hgb urine dipstick SMALL (*)    Leukocytes, UA LARGE (*)    All other components within normal limits    Imaging Review No results found.   MDM   1. Diarrhea    This may be related to dietary intake. Recommended avoiding dairy products, eggs, and oily foods. She is alert he taking a probiotic. Discussed extensively that a little bit of diarrhea is not dangerous. Warning signs reviewed including dehydration and blood in stool. Follow-up with GI doctor as scheduled November 16.    Melony Overly, MD 06/17/14 2032

## 2014-06-17 NOTE — Discharge Instructions (Signed)
I do not see anything dangerous with the diarrhea at this time. Drink plenty of fluids. Avoid oils, dairy products and eggs. You can take a probiotic daily. Follow up with the GI doctor as scheduled.  If you develop constant diarrhea, black tarry stools, or bright red blood in the stool, please go to the emergency room.

## 2014-06-23 ENCOUNTER — Ambulatory Visit (INDEPENDENT_AMBULATORY_CARE_PROVIDER_SITE_OTHER): Payer: Medicare Other

## 2014-06-23 ENCOUNTER — Ambulatory Visit: Payer: Medicare Other

## 2014-06-23 DIAGNOSIS — M79673 Pain in unspecified foot: Secondary | ICD-10-CM

## 2014-06-23 DIAGNOSIS — M2040 Other hammer toe(s) (acquired), unspecified foot: Secondary | ICD-10-CM

## 2014-06-23 DIAGNOSIS — B351 Tinea unguium: Secondary | ICD-10-CM

## 2014-06-23 DIAGNOSIS — E1140 Type 2 diabetes mellitus with diabetic neuropathy, unspecified: Secondary | ICD-10-CM | POA: Diagnosis not present

## 2014-06-23 NOTE — Progress Notes (Signed)
   Subjective:    Patient ID: Heidi Dominguez, female    DOB: 1921-01-28, 80 y.o.   MRN: [id number]  HPI Pt presents for nail debridement,and puds   Review of Systems no new findings or systemic changes noted     Objective:   Physical Exam Vascular status is intact and unchanged pedal pulses DP +2 PT plus one over 4 capillary refill time 3 seconds epicritic and proprioceptive sensations in diminished on Semmes Weinstein to the forefoot and digits. Has digital contractures associated keratoses HD 5 bilateral. Nails thick brittle chromic frontal dystrophic ingrown orthotic 1 through 5 bilateral. Patient does have significant arthropathy hammertoe deformities and digital contractures noted one pair shoes 3 pairs of dual density Plastizote inlays are dispensed a fit and contour well to the foot with full contact patient also underwent debridement and palliative nail care at this time.       Assessment & Plan:  Assessment diabetes with complications peripheral neuropathy and angiopathy. Digital contractures associated keratoses are noted shoes and insoles are dispensed a fit and contour well full contact with wearing written and oral instructions for break-in and use. Also at this time debridement of nails thick brittle dystrophic friable mycotic nails 1 through 5 bilateral return for follow-up in 2-3 months for continued palliative care in the future as needed   Harriet Masson DPM

## 2014-06-29 DIAGNOSIS — R197 Diarrhea, unspecified: Secondary | ICD-10-CM | POA: Diagnosis not present

## 2014-06-29 DIAGNOSIS — Z23 Encounter for immunization: Secondary | ICD-10-CM | POA: Diagnosis not present

## 2014-06-30 ENCOUNTER — Ambulatory Visit: Payer: Medicare Other

## 2014-07-14 DIAGNOSIS — H3532 Exudative age-related macular degeneration: Secondary | ICD-10-CM | POA: Diagnosis not present

## 2014-08-11 DIAGNOSIS — H3532 Exudative age-related macular degeneration: Secondary | ICD-10-CM | POA: Diagnosis not present

## 2014-08-14 HISTORY — PX: CHOLECYSTECTOMY: SHX55

## 2014-09-10 DIAGNOSIS — H3532 Exudative age-related macular degeneration: Secondary | ICD-10-CM | POA: Diagnosis not present

## 2014-09-22 ENCOUNTER — Ambulatory Visit: Payer: Medicare Other

## 2014-09-22 ENCOUNTER — Ambulatory Visit (INDEPENDENT_AMBULATORY_CARE_PROVIDER_SITE_OTHER): Payer: Medicare Other

## 2014-09-22 DIAGNOSIS — M79673 Pain in unspecified foot: Secondary | ICD-10-CM | POA: Diagnosis not present

## 2014-09-22 DIAGNOSIS — Q828 Other specified congenital malformations of skin: Secondary | ICD-10-CM

## 2014-09-22 DIAGNOSIS — E1140 Type 2 diabetes mellitus with diabetic neuropathy, unspecified: Secondary | ICD-10-CM

## 2014-09-22 DIAGNOSIS — B351 Tinea unguium: Secondary | ICD-10-CM | POA: Diagnosis not present

## 2014-09-22 DIAGNOSIS — M2040 Other hammer toe(s) (acquired), unspecified foot: Secondary | ICD-10-CM

## 2014-09-22 NOTE — Progress Notes (Signed)
   Subjective:    Patient ID: Heidi Dominguez, female    DOB: [birth year], 81 y.o.   MRN: [id number]  HPI Pt presents for nail debridement   Review of Systems no new findings or systemic changes noted     Objective:   Physical Exam  Neurovascular status is intact DP +2 over 4 bilateral PT 1 over 4 bilateral there is some decreased sensation Semmes Weinstein to the forefoot digits and arch bilateral. Pinch callus of both hallux at the MTP and IP joints noted bilateral. No open wounds no ulcers no secondary infections at this time. Mild hammertoe deformities with some diffuse keratoses noted as well. Nails thick brittle crumbly friable discolored 1 through 5 bilateral painful tender with enclosed shoe and ambulation.      Assessment & Plan:  Assessment this time diabetes with complications peripheral neuropathy and angiopathy. Painful keratoses pinch callus both hallux are debrided at this time also debridement of nails 1 through 5 bilateral brittle crumbly friable mycotic nails are debrided return for future palliative care every 3 months as recommended  Harriet Masson DPM

## 2014-10-08 DIAGNOSIS — J309 Allergic rhinitis, unspecified: Secondary | ICD-10-CM | POA: Diagnosis not present

## 2014-10-08 DIAGNOSIS — H6123 Impacted cerumen, bilateral: Secondary | ICD-10-CM | POA: Diagnosis not present

## 2014-10-15 DIAGNOSIS — H3532 Exudative age-related macular degeneration: Secondary | ICD-10-CM | POA: Diagnosis not present

## 2014-11-26 DIAGNOSIS — H3532 Exudative age-related macular degeneration: Secondary | ICD-10-CM | POA: Diagnosis not present

## 2014-12-15 DIAGNOSIS — I340 Nonrheumatic mitral (valve) insufficiency: Secondary | ICD-10-CM | POA: Diagnosis not present

## 2014-12-15 DIAGNOSIS — Z79899 Other long term (current) drug therapy: Secondary | ICD-10-CM | POA: Diagnosis not present

## 2014-12-15 DIAGNOSIS — E780 Pure hypercholesterolemia: Secondary | ICD-10-CM | POA: Diagnosis not present

## 2014-12-15 DIAGNOSIS — Z1389 Encounter for screening for other disorder: Secondary | ICD-10-CM | POA: Diagnosis not present

## 2014-12-15 DIAGNOSIS — M2012 Hallux valgus (acquired), left foot: Secondary | ICD-10-CM | POA: Diagnosis not present

## 2014-12-15 DIAGNOSIS — E1121 Type 2 diabetes mellitus with diabetic nephropathy: Secondary | ICD-10-CM | POA: Diagnosis not present

## 2014-12-15 DIAGNOSIS — E11329 Type 2 diabetes mellitus with mild nonproliferative diabetic retinopathy without macular edema: Secondary | ICD-10-CM | POA: Diagnosis not present

## 2014-12-15 DIAGNOSIS — Z0000 Encounter for general adult medical examination without abnormal findings: Secondary | ICD-10-CM | POA: Diagnosis not present

## 2014-12-15 DIAGNOSIS — I10 Essential (primary) hypertension: Secondary | ICD-10-CM | POA: Diagnosis not present

## 2014-12-17 ENCOUNTER — Ambulatory Visit (INDEPENDENT_AMBULATORY_CARE_PROVIDER_SITE_OTHER): Payer: Medicare Other | Admitting: Podiatry

## 2014-12-17 ENCOUNTER — Encounter: Payer: Self-pay | Admitting: Podiatry

## 2014-12-17 DIAGNOSIS — Q828 Other specified congenital malformations of skin: Secondary | ICD-10-CM | POA: Diagnosis not present

## 2014-12-17 DIAGNOSIS — E1151 Type 2 diabetes mellitus with diabetic peripheral angiopathy without gangrene: Secondary | ICD-10-CM | POA: Diagnosis not present

## 2014-12-17 DIAGNOSIS — B351 Tinea unguium: Secondary | ICD-10-CM

## 2014-12-17 DIAGNOSIS — M79673 Pain in unspecified foot: Secondary | ICD-10-CM

## 2014-12-17 NOTE — Progress Notes (Signed)
Subjective:     Patient ID: Heidi Dominguez, female   DOB: 1920-09-10, 81 y.o.   MRN: [id number]  HPI patient presents with painful lesion sub-first metatarsal head left it's got some underlying bleeding associated with it structural bunion deformity noted and nail disease 1-5 both feet that are thick yellow and brittle. Patient is a diabetic and not in great condition   Review of Systems     Objective:   Physical Exam Neurovascular status diminished but unchanged with thick yellow brittle nailbeds 1-5 both feet and they're noted to be a lesion on the plantar aspect left first metatarsal it's very tender when pressed with keratotic tissue formation noted and down of tissue    Assessment:     At risk diabetic with lesion formation structural changes in her feet and nail disease with pain 1-5 bilateral    Plan:     Debride lesion on the left foot and applied sterile dressing packing it with Neosporin and padding and debrided nailbeds 1-5 both feet with no iatrogenic bleeding noted. Patient will have diabetic shoes and is at this time having them sent to her family physician for approval

## 2014-12-29 ENCOUNTER — Ambulatory Visit: Payer: Medicare Other

## 2015-01-12 DIAGNOSIS — E11359 Type 2 diabetes mellitus with proliferative diabetic retinopathy without macular edema: Secondary | ICD-10-CM | POA: Diagnosis not present

## 2015-01-12 DIAGNOSIS — H3532 Exudative age-related macular degeneration: Secondary | ICD-10-CM | POA: Diagnosis not present

## 2015-02-02 ENCOUNTER — Ambulatory Visit: Payer: Medicare Other | Admitting: *Deleted

## 2015-02-02 DIAGNOSIS — E1140 Type 2 diabetes mellitus with diabetic neuropathy, unspecified: Secondary | ICD-10-CM

## 2015-02-02 DIAGNOSIS — B351 Tinea unguium: Secondary | ICD-10-CM

## 2015-02-02 NOTE — Progress Notes (Deleted)
   Subjective:    Patient ID: Heidi Dominguez, female    DOB: [birth year], 81 y.o.   MRN: [id number]  HPI Patient presents for diabetic shoe measurement.  Patient also states that her callus on her right 1st toe is very painful     Review of Systems     Objective:   Physical Exam        Assessment & Plan:

## 2015-02-02 NOTE — Progress Notes (Signed)
Patient ID: Heidi Dominguez, female   DOB: September 24, 1920, 81 y.o.   MRN: [id number] Patient presents for diabetic shoe measurement.  Will mark left great toe for off loading.

## 2015-02-11 ENCOUNTER — Ambulatory Visit (INDEPENDENT_AMBULATORY_CARE_PROVIDER_SITE_OTHER): Payer: Medicare Other | Admitting: Podiatry

## 2015-02-11 ENCOUNTER — Encounter: Payer: Self-pay | Admitting: Podiatry

## 2015-02-11 VITALS — BP 174/83 | HR 67 | Resp 15

## 2015-02-11 DIAGNOSIS — M79673 Pain in unspecified foot: Secondary | ICD-10-CM

## 2015-02-11 DIAGNOSIS — E1140 Type 2 diabetes mellitus with diabetic neuropathy, unspecified: Secondary | ICD-10-CM

## 2015-02-11 DIAGNOSIS — Q828 Other specified congenital malformations of skin: Secondary | ICD-10-CM

## 2015-02-11 NOTE — Progress Notes (Signed)
Subjective:     Patient ID: Heidi Dominguez, female   DOB: [birth year], 81 y.o.   MRN: [id number]  HPI patient presents with painful lesion left first metatarsal and long-term diabetic who is waiting for diabetic shoes   Review of Systems     Objective:   Physical Exam Neurovascular status unchanged with structural damage to the left first metatarsal with keratotic lesion underneath the first metatarsal head that's moderately painful when pressed    Assessment:     Lesion with keratotic tissue and pain left first metatarsal    Plan:     Debrided the lesion fully no iatrogenic bleeding and applied padding which will be repeated as needed and wait for diabetic shoes

## 2015-02-19 ENCOUNTER — Ambulatory Visit (INDEPENDENT_AMBULATORY_CARE_PROVIDER_SITE_OTHER): Payer: Medicare Other | Admitting: Podiatry

## 2015-02-19 DIAGNOSIS — M2040 Other hammer toe(s) (acquired), unspecified foot: Secondary | ICD-10-CM

## 2015-02-19 DIAGNOSIS — M2042 Other hammer toe(s) (acquired), left foot: Secondary | ICD-10-CM

## 2015-02-19 DIAGNOSIS — M2041 Other hammer toe(s) (acquired), right foot: Secondary | ICD-10-CM | POA: Diagnosis not present

## 2015-02-19 DIAGNOSIS — E1151 Type 2 diabetes mellitus with diabetic peripheral angiopathy without gangrene: Secondary | ICD-10-CM | POA: Diagnosis not present

## 2015-02-19 DIAGNOSIS — Q828 Other specified congenital malformations of skin: Secondary | ICD-10-CM

## 2015-02-19 DIAGNOSIS — E1140 Type 2 diabetes mellitus with diabetic neuropathy, unspecified: Secondary | ICD-10-CM | POA: Diagnosis not present

## 2015-02-19 DIAGNOSIS — L84 Corns and callosities: Secondary | ICD-10-CM | POA: Diagnosis not present

## 2015-02-19 NOTE — Patient Instructions (Signed)

## 2015-02-19 NOTE — Progress Notes (Signed)
Patient ID: Heidi Dominguez, female   DOB: Dec 29, 1920, 81 y.o.   MRN: [id number] Patient presents for diabetic shoe pick up, shoes are tried on for good fit.  Patient received 1 pair Apex A720 black Emmy in women's size 9.5 wide with 3 pairs custom diabetic inserts.  Verbal and written break in and wear instructions given. Patient will follow up for her regularly scheduled care.

## 2015-03-02 DIAGNOSIS — H3532 Exudative age-related macular degeneration: Secondary | ICD-10-CM | POA: Diagnosis not present

## 2015-03-26 ENCOUNTER — Ambulatory Visit (INDEPENDENT_AMBULATORY_CARE_PROVIDER_SITE_OTHER): Payer: Medicare Other | Admitting: Podiatry

## 2015-03-26 ENCOUNTER — Encounter: Payer: Self-pay | Admitting: Podiatry

## 2015-03-26 VITALS — BP 147/77 | HR 66 | Resp 18

## 2015-03-26 DIAGNOSIS — M79673 Pain in unspecified foot: Secondary | ICD-10-CM

## 2015-03-26 DIAGNOSIS — B351 Tinea unguium: Secondary | ICD-10-CM

## 2015-03-26 NOTE — Progress Notes (Signed)
Patient ID: Heidi Dominguez, female   DOB: March 09, 1921, 81 y.o.   MRN: [id number] Complaint:  Visit Type: Patient returns to my office for continued preventative foot care services. Complaint: Patient states" my nails have grown long and thick and become painful to walk and wear shoes" Patient has been diagnosed with DM with no foot complications. The patient presents for preventative foot care services. No changes to ROS  Podiatric Exam: Vascular: dorsalis pedis and posterior tibial pulses are palpable bilateral. Capillary return is immediate. Temperature gradient is WNL. Skin turgor WNL  Sensorium: Normal Semmes Weinstein monofilament test. Normal tactile sensation bilaterally. Nail Exam: Pt has thick disfigured discolored nails with subungual debris noted bilateral entire nail hallux through fifth toenails Ulcer Exam: There is no evidence of ulcer or pre-ulcerative changes or infection. Orthopedic Exam: Muscle tone and strength are WNL. No limitations in general ROM. No crepitus or effusions noted. Foot type and digits show no abnormalities. Bony prominences are unremarkable. Severe HAV 1st MPJ B/l Skin: No Porokeratosis. No infection or ulcers.  Callus sub 1 due to severe HAV.  Diagnosis:  Onychomycosis, , Pain in right toe, pain in left toes  Treatment & Plan Procedures and Treatment: Consent by patient was obtained for treatment procedures. The patient understood the discussion of treatment and procedures well. All questions were answered thoroughly reviewed. Debridement of mycotic and hypertrophic toenails, 1 through 5 bilateral and clearing of subungual debris. No ulceration, no infection noted.  Return Visit-Office Procedure: Patient instructed to return to the office for a follow up visit 3 months for continued evaluation and treatment.

## 2015-03-31 ENCOUNTER — Ambulatory Visit (INDEPENDENT_AMBULATORY_CARE_PROVIDER_SITE_OTHER)
Admission: RE | Admit: 2015-03-31 | Discharge: 2015-03-31 | Disposition: A | Discharge status: Home / Self Care | Payer: Medicare Other | Source: Ambulatory Visit | Attending: Internal Medicine | Admitting: Internal Medicine

## 2015-03-31 ENCOUNTER — Ambulatory Visit (INDEPENDENT_AMBULATORY_CARE_PROVIDER_SITE_OTHER): Payer: Medicare Other | Admitting: Internal Medicine

## 2015-03-31 ENCOUNTER — Encounter: Payer: Self-pay | Admitting: Internal Medicine

## 2015-03-31 VITALS — BP 136/80 | HR 71 | Ht 64.0 in | Wt 198.6 lb

## 2015-03-31 DIAGNOSIS — I10 Essential (primary) hypertension: Secondary | ICD-10-CM

## 2015-03-31 DIAGNOSIS — R0600 Dyspnea, unspecified: Secondary | ICD-10-CM | POA: Diagnosis not present

## 2015-03-31 DIAGNOSIS — R05 Cough: Secondary | ICD-10-CM | POA: Diagnosis not present

## 2015-03-31 DIAGNOSIS — R0609 Other forms of dyspnea: Secondary | ICD-10-CM | POA: Insufficient documentation

## 2015-03-31 MED ORDER — VALSARTAN 160 MG PO TABS: 160.0000 mg | ORAL_TABLET | Freq: Every day | ORAL | Status: DC

## 2015-03-31 MED ORDER — AMLODIPINE BESYLATE 5 MG PO TABS: 5.0000 mg | ORAL_TABLET | Freq: Every day | ORAL | Status: DC

## 2015-03-31 NOTE — Progress Notes (Signed)
Subjective:    Patient ID: Heidi Dominguez, female    DOB: 04-07-1921,    MRN: [id number]  HPI  51 yobf never smoker never asthma some sinus issues in fall > spring new onset wheezing x one year not consistenlty better with advair so self referred to pulmonary clinic 03/31/2015    03/31/2015 1st [facility] office visit/ Nerine Pulse  On ACEi  Chief Complaint  Patient presents with  . Pulmonary Consult    Self referral. Pt c/o non-productive cough, wheezing, and SOB with exertion x 6 months. Pt states over the past weeks symptoms have become worse. Pt also states chest soreness when in bed.   indolent onset persistent daily symptoms of cough > sob worse at hs despite propping up higher with 2 pillows but does allow sleeping ok  And does not recur until stirs in am So severe at times she gets diffuse non-pleuritic ant cp  No obvious other patterns in day to day or daytime variabilty or assoc overt sinus or hb symptoms. No unusual exp hx or h/o childhood pna/ asthma or knowledge of premature birth.  Sleeping ok  Propped up without nocturnal  or early am exacerbation  of respiratory  c/o's or need for noct saba or excess am mucus. Also denies any obvious fluctuation of symptoms with weather or environmental changes or other aggravating or alleviating factors except as outlined above   Current Medications, Allergies, Complete Past Medical History, Past Surgical History, Family History, and Social History were reviewed in [employer] record.                Review of Systems  Constitutional: Negative for fever, chills and unexpected weight change.  HENT: Negative for congestion, dental problem, ear pain, nosebleeds, postnasal drip, rhinorrhea, sinus pressure, sneezing, sore throat, trouble swallowing and voice change.   Eyes: Negative for visual disturbance.  Respiratory: Positive for cough, chest tightness, shortness of breath and wheezing. Negative for choking.     Cardiovascular: Negative for chest pain and leg swelling.  Gastrointestinal: Negative for vomiting, abdominal pain and diarrhea.  Genitourinary: Negative for difficulty urinating.  Musculoskeletal: Negative for arthralgias.  Skin: Negative for rash.  Neurological: Negative for tremors, syncope and headaches.  Hematological: Does not bruise/bleed easily.       Objective:   Physical Exam  Pleasant bf nad/ using 4 wheeled walker with breaks / classic voice fatigue   Wt Readings from Last 3 Encounters:  03/31/15 198 lb 9.6 oz (90.084 kg)  10/17/13 199 lb (90.266 kg)  07/23/13 195 lb (88.451 kg)    HEENT: nl dentition, turbinates, and orophanx. Nl external ear canals without cough reflex   NECK :  without JVD/Nodes/TM/ nl carotid upstrokes bilaterally   LUNGS: no acc muscle use, clear to A and P bilaterally without cough on insp or exp maneuvers   CV:  RRR  no s3 or murmur or increase in P2, min bilateral pedal edema   ABD:  soft and nontender with nl excursion in the supine position. No bruits or organomegaly, bowel sounds nl  MS:  warm without deformities, calf tenderness, cyanosis or clubbing  SKIN: warm and dry without lesions    NEURO:  alert, approp, no deficits     CXR PA and Lateral:   03/31/2015 :     I personally reviewed images and agree with radiology impression as follows:    Mediastinum and hilar structures normal. Cardiomegaly with normal pulmonary vascularity. No focal pulmonary infiltrate.  No pleural effusion or pneumothorax. Bilateral shoulder replacements.    Assessment & Plan:

## 2015-03-31 NOTE — Patient Instructions (Addendum)
Target blood pressure is less 130 /80  Stop lotrel (amlodipine/benapril)   Start amlopidine 5 mg daily and valsartan 160 mg one daly in place of the lotrel  Take zantac (ranitidine) after bfast and also after supper   Please remember to go to the  x-ray department downstairs for your tests - we will call you with the results when they are available.    Please schedule a follow up office visit in 4 weeks, sooner if needed to see our NP Tammy and bring all meds (no med calendar for now) If not all better change normodyne to bisoprolol Late add: stop advair  for now and just use xopenex prn

## 2015-04-01 ENCOUNTER — Telehealth: Payer: Self-pay | Admitting: *Deleted

## 2015-04-01 ENCOUNTER — Encounter: Payer: Self-pay | Admitting: Internal Medicine

## 2015-04-01 NOTE — Assessment & Plan Note (Signed)
ACE inhibitors are problematic in  pts with airway complaints because  even experienced pulmonologists can't always distinguish ace effects from copd/asthma/pnds/ allergies etc.  By themselves they don't actually cause a problem, much like oxygen can't by itself start a fire, but they certainly serve as a powerful catalyst or enhancer for any "fire"  or inflammatory process in the upper airway, be it caused by an ET  tube or more commonly reflux (especially in the obese or pts with known GERD or who are on biphoshonates) or URI's, due to interference with bradykinin clearance.  The effects of acei on bradykinin levels occurs in 100% of pt's on acei (unless they surreptitiously stop the med!) but the classic cough is only reported in 5%.  This leaves 95% of pts on acei's  with a variety of syndromes including no identifiable symptom in most  vs non-specific symptoms that wax and wane depending on what other insult is occuring at the level of the upper airway, esp GERD a concern here.  Only way to know is trial off > change to diovan 160 mg daily and f/u in 4 weeks and if still "wheezing" then look at BB next - Strongly prefer in this setting: Bystolic, the most beta -1  selective Beta blocker available in sample form, with bisoprolol the most selective generic choice  on the market.

## 2015-04-01 NOTE — Telephone Encounter (Signed)
ATC line busy x 3 wcb 

## 2015-04-01 NOTE — Telephone Encounter (Signed)
-----   Message from Tanda Rockers, MD sent at 04/01/2015  6:09 AM EDT ----- stop for now and just use xopenex prn

## 2015-04-01 NOTE — Assessment & Plan Note (Addendum)
Symptoms are markedly disproportionate to objective findings and not clear this is a lung problem but pt does appear to have difficult airway management issues. DDX of  difficult airways management all start with A and  include Adherence, Ace Inhibitors, Acid Reflux, Active Sinus Disease, Alpha 1 Antitripsin deficiency, Anxiety masquerading as Airways dz,  ABPA,  allergy(esp in young), Aspiration (esp in elderly), Adverse effects of meds,  Active smokers, A bunch of PE's (a small clot burden can't cause this syndrome unless there is already severe underlying pulm or vascular dz with poor reserve) plus two Bs  = Bronchiectasis and Beta blocker use..and one C= CHF  Adherence is always the initial "prime suspect" and is a multilayered concern that requires a "trust but verify" approach in every patient - starting with knowing how to use medications, especially inhalers, correctly, keeping up with refills and understanding the fundamental difference between maintenance and prns vs those medications only taken for a very short course and then stopped and not refilled.   ? ACEi > top of usual list of suspects, try off this first  ? Acid (or non-acid) GERD > always difficult to exclude as up to 75% of pts in some series report no assoc GI/ Heartburn symptoms> rec continue max (24h)  acid suppression   ? advair effects > certainly not helping so should stop for now and just use xopenex prn   ? Allergy/ asthma > very unlikely  onset at ate 91   ? BB > normodyne not optimal Beta 1 selectivity if she really does have asthma so would consider change it next (see hbp)   I had an extended discussion with the patient reviewing all relevant studies completed to date and  lasting 72m  Each maintenance medication was reviewed in detail including most importantly the difference between maintenance and prns and under what circumstances the prns are to be triggered using an action plan format that is not reflected in the  computer generated alphabetically organized AVS.    Please see instructions for details which were reviewed in writing and the patient given a copy highlighting the part that I personally wrote and discussed at today's ov.

## 2015-04-07 ENCOUNTER — Inpatient Hospital Stay (HOSPITAL_COMMUNITY)
Admission: EM | Admit: 2015-04-07 | Discharge: 2015-04-13 | DRG: 418 | Disposition: A | Discharge status: Skilled Nursing Facility | Payer: Medicare Other | Attending: Internal Medicine | Admitting: Internal Medicine

## 2015-04-07 ENCOUNTER — Emergency Department (HOSPITAL_COMMUNITY): Payer: Medicare Other

## 2015-04-07 ENCOUNTER — Encounter (HOSPITAL_COMMUNITY): Payer: Self-pay | Admitting: Emergency Medicine

## 2015-04-07 DIAGNOSIS — R945 Abnormal results of liver function studies: Secondary | ICD-10-CM | POA: Diagnosis not present

## 2015-04-07 DIAGNOSIS — I10 Essential (primary) hypertension: Secondary | ICD-10-CM | POA: Diagnosis present

## 2015-04-07 DIAGNOSIS — E119 Type 2 diabetes mellitus without complications: Secondary | ICD-10-CM | POA: Diagnosis not present

## 2015-04-07 DIAGNOSIS — D696 Thrombocytopenia, unspecified: Secondary | ICD-10-CM | POA: Diagnosis not present

## 2015-04-07 DIAGNOSIS — R0600 Dyspnea, unspecified: Secondary | ICD-10-CM

## 2015-04-07 DIAGNOSIS — Z833 Family history of diabetes mellitus: Secondary | ICD-10-CM

## 2015-04-07 DIAGNOSIS — E780 Pure hypercholesterolemia: Secondary | ICD-10-CM | POA: Diagnosis present

## 2015-04-07 DIAGNOSIS — K219 Gastro-esophageal reflux disease without esophagitis: Secondary | ICD-10-CM | POA: Diagnosis present

## 2015-04-07 DIAGNOSIS — K851 Biliary acute pancreatitis without necrosis or infection: Secondary | ICD-10-CM

## 2015-04-07 DIAGNOSIS — R748 Abnormal levels of other serum enzymes: Secondary | ICD-10-CM

## 2015-04-07 DIAGNOSIS — Z8249 Family history of ischemic heart disease and other diseases of the circulatory system: Secondary | ICD-10-CM

## 2015-04-07 DIAGNOSIS — Z01810 Encounter for preprocedural cardiovascular examination: Secondary | ICD-10-CM | POA: Insufficient documentation

## 2015-04-07 DIAGNOSIS — K8020 Calculus of gallbladder without cholecystitis without obstruction: Secondary | ICD-10-CM | POA: Diagnosis not present

## 2015-04-07 DIAGNOSIS — H9190 Unspecified hearing loss, unspecified ear: Secondary | ICD-10-CM | POA: Diagnosis present

## 2015-04-07 DIAGNOSIS — K8064 Calculus of gallbladder and bile duct with chronic cholecystitis without obstruction: Secondary | ICD-10-CM | POA: Diagnosis present

## 2015-04-07 DIAGNOSIS — I340 Nonrheumatic mitral (valve) insufficiency: Secondary | ICD-10-CM | POA: Diagnosis present

## 2015-04-07 DIAGNOSIS — I5032 Chronic diastolic (congestive) heart failure: Secondary | ICD-10-CM | POA: Diagnosis present

## 2015-04-07 DIAGNOSIS — R1013 Epigastric pain: Secondary | ICD-10-CM | POA: Diagnosis not present

## 2015-04-07 DIAGNOSIS — K838 Other specified diseases of biliary tract: Secondary | ICD-10-CM | POA: Diagnosis not present

## 2015-04-07 DIAGNOSIS — K30 Functional dyspepsia: Secondary | ICD-10-CM | POA: Diagnosis present

## 2015-04-07 DIAGNOSIS — R1033 Periumbilical pain: Secondary | ICD-10-CM | POA: Diagnosis not present

## 2015-04-07 DIAGNOSIS — E785 Hyperlipidemia, unspecified: Secondary | ICD-10-CM | POA: Diagnosis present

## 2015-04-07 DIAGNOSIS — K660 Peritoneal adhesions (postprocedural) (postinfection): Secondary | ICD-10-CM | POA: Diagnosis present

## 2015-04-07 DIAGNOSIS — K8044 Calculus of bile duct with chronic cholecystitis without obstruction: Secondary | ICD-10-CM

## 2015-04-07 DIAGNOSIS — I129 Hypertensive chronic kidney disease with stage 1 through stage 4 chronic kidney disease, or unspecified chronic kidney disease: Secondary | ICD-10-CM | POA: Diagnosis present

## 2015-04-07 DIAGNOSIS — R32 Unspecified urinary incontinence: Secondary | ICD-10-CM | POA: Diagnosis present

## 2015-04-07 DIAGNOSIS — E876 Hypokalemia: Secondary | ICD-10-CM | POA: Diagnosis not present

## 2015-04-07 DIAGNOSIS — K819 Cholecystitis, unspecified: Secondary | ICD-10-CM

## 2015-04-07 HISTORY — DX: Chronic diastolic (congestive) heart failure: I50.32

## 2015-04-07 LAB — URINALYSIS, ROUTINE W REFLEX MICROSCOPIC
BILIRUBIN URINE: NEGATIVE
Glucose, UA: NEGATIVE mg/dL
KETONES UR: NEGATIVE mg/dL
NITRITE: NEGATIVE
PH: 6 (ref 5.0–8.0)
Protein, ur: 30 mg/dL — AB
Specific Gravity, Urine: 1.016 (ref 1.005–1.030)
Urobilinogen, UA: 1 mg/dL (ref 0.0–1.0)

## 2015-04-07 LAB — CBC
HCT: 44.5 % (ref 36.0–46.0)
Hemoglobin: 14.2 g/dL (ref 12.0–15.0)
MCH: 30.3 pg (ref 26.0–34.0)
MCHC: 31.9 g/dL (ref 30.0–36.0)
MCV: 94.9 fL (ref 78.0–100.0)
PLATELETS: 161 10*3/uL (ref 150–400)
RBC: 4.69 MIL/uL (ref 3.87–5.11)
RDW: 14 % (ref 11.5–15.5)
WBC: 15.6 10*3/uL — AB (ref 4.0–10.5)

## 2015-04-07 LAB — COMPREHENSIVE METABOLIC PANEL
ALK PHOS: 130 U/L — AB (ref 38–126)
ALT: 216 U/L — AB (ref 14–54)
AST: 375 U/L — AB (ref 15–41)
Albumin: 3.8 g/dL (ref 3.5–5.0)
Anion gap: 8 (ref 5–15)
BILIRUBIN TOTAL: 1.4 mg/dL — AB (ref 0.3–1.2)
BUN: 19 mg/dL (ref 6–20)
CALCIUM: 9.7 mg/dL (ref 8.9–10.3)
CHLORIDE: 100 mmol/L — AB (ref 101–111)
CO2: 30 mmol/L (ref 22–32)
CREATININE: 0.87 mg/dL (ref 0.44–1.00)
GFR calc Af Amer: >60 mL/min (ref >60)
GFR, EST NON AFRICAN AMERICAN: 56 mL/min — AB (ref >60)
Glucose, Bld: 196 mg/dL — ABNORMAL HIGH (ref 65–99)
Potassium: 3.8 mmol/L (ref 3.5–5.1)
Sodium: 138 mmol/L (ref 135–145)
TOTAL PROTEIN: 7.4 g/dL (ref 6.5–8.1)

## 2015-04-07 LAB — URINE MICROSCOPIC-ADD ON

## 2015-04-07 LAB — LIPASE, BLOOD: Lipase: 2374 U/L — ABNORMAL HIGH (ref 22–51)

## 2015-04-07 MED ORDER — ONDANSETRON HCL 4 MG/2ML IJ SOLN
4.0000 mg | Freq: Once | INTRAMUSCULAR | Status: AC
  Administered 2015-04-08: 4 mg via INTRAVENOUS
  Filled 2015-04-07: qty 2

## 2015-04-07 MED ORDER — SODIUM CHLORIDE 0.9 % IV SOLN
INTRAVENOUS | Status: DC
  Administered 2015-04-08: 01:00:00 via INTRAVENOUS

## 2015-04-07 MED ORDER — FENTANYL CITRATE (PF) 100 MCG/2ML IJ SOLN
50.0000 ug | Freq: Once | INTRAMUSCULAR | Status: AC
  Administered 2015-04-08: 50 ug via INTRAVENOUS
  Filled 2015-04-07: qty 2

## 2015-04-07 NOTE — ED Notes (Signed)
US at bedside

## 2015-04-07 NOTE — ED Notes (Signed)
Pt adds that she becomes short of breath on exertion

## 2015-04-07 NOTE — ED Provider Notes (Signed)
CSN: [id number]   Arrival date & time 04/07/15 2149  History  This chart was scribed for Rolland Porter, MD by Altamease Oiler, ED Scribe. This patient was seen in room WA19/WA19 and the patient's care was started at 11:14 PM.  Chief Complaint  Patient presents with  . Abdominal Pain    HPI The history is provided by the patient. No language interpreter was used.   Heidi Dominguez is a 81 y.o. female with PMHx of GERD, CHF, HTN, DM, high cholesterol, and CKD who presents to the Emergency Department complaining of new and constant abdominal pain (epigastric) with gradual onset this morning around 10 AM. She describes the pain as dull and rates it 9/10 in severity. The pain is worse with coughing and breathing but not affected by eating. Pt notes that the pain has improved since initial onset. Associated symptoms include mild cough, nausea after supper, and 1 episode of emesis. She has had loss of appetite since the pain started this morning.  Pt associates the symptoms with having eaten liver pudding that had been in the refrigerator for more than 1 week. Pt denies diarrhea, rectal bleeding, new back pain, or fever. No known sick contact. She has a caregiver who lives with her. Pt denies tobacco use.   PCP Dr [facility] [place]  Past Medical History  Diagnosis Date  . Diabetes mellitus   . Hypertension   . High cholesterol   . Osteoarthritis   . Degenerative joint disease of shoulder region   . HOH (hard of hearing)   . H/O knee surgery   . H/O shoulder surgery   . Complication of anesthesia     I have a hard time waking up"  . CHF (congestive heart failure)   . Pneumonia   . GERD (gastroesophageal reflux disease)   . Chronic kidney disease     incontinence/ fAMILYN DENIES HX OF CKD    Past Surgical History  Procedure Laterality Date  . Shoulder surgery    . Knee surgery    . Back surgery    . Abdominal hysterectomy    . Cataracts      bilateral    History reviewed. No  pertinent family history.  Social History  Substance Use Topics  . Smoking status: Never Smoker   . Smokeless tobacco: Never Used  . Alcohol Use: No   lives alone, but has a helper comes in  Review of Systems  Constitutional: Negative for fever.  Respiratory: Positive for cough.   Gastrointestinal: Positive for nausea, vomiting and abdominal pain. Negative for diarrhea.  Musculoskeletal: Negative for back pain.  All other systems reviewed and are negative.   Home Medications   Prior to Admission medications   Medication Sig Start Date End Date Taking? Authorizing Provider  acetaminophen (TYLENOL) 325 MG tablet Take 2 tablets (650 mg total) by mouth every 6 (six) hours as needed (or Fever >/= 101). 08/12/12  Yes Nita Sells, MD  amLODipine (NORVASC) 5 MG tablet Take 1 tablet (5 mg total) by mouth daily. 03/31/15  Yes Tanda Rockers, MD  aspirin 81 MG tablet Take 81 mg by mouth daily.   Yes Historical Provider, MD  benzonatate (TESSALON) 100 MG capsule Take by mouth 3 (three) times daily as needed for cough.   Yes Historical Provider, MD  Cholecalciferol (VITAMIN D3) 5000 UNITS CAPS Take 1 capsule by mouth daily.   Yes Historical Provider, MD  Ferrous Gluconate (IRON) 240 (27 FE) MG TABS Take 1  tablet by mouth daily.   Yes Historical Provider, MD  furosemide (LASIX) 20 MG tablet 1 TABLET (20 MG) BY MOUTH DAILY 03/22/15  Yes Historical Provider, MD  JANUVIA 50 MG tablet 1 TABLET (50 MG) BY MOUTH DAILY 03/11/15  Yes Historical Provider, MD  labetalol (NORMODYNE) 200 MG tablet Take 200 mg by mouth 2 (two) times daily.   Yes Historical Provider, MD  levalbuterol (XOPENEX) 1.25 MG/3ML nebulizer solution Take 1.25 mg by nebulization every 4 (four) hours as needed for wheezing.   Yes Historical Provider, MD  Magnesium 250 MG TABS Take 250 mg by mouth daily.    Yes Historical Provider, MD  mirabegron ER (MYRBETRIQ) 50 MG TB24 tablet Take 50 mg by mouth daily.   Yes Historical Provider, MD   potassium chloride (K-DUR,KLOR-CON) 10 MEQ tablet Take 10 mEq by mouth daily.  03/22/15  Yes Historical Provider, MD  pravastatin (PRAVACHOL) 40 MG tablet Take 40 mg by mouth daily.   Yes Historical Provider, MD  ranitidine (ZANTAC) 150 MG tablet Take 1 tablet (150 mg total) by mouth 2 (two) times daily. 02/29/12 04/07/16 Yes Dixie Dials, MD  valsartan (DIOVAN) 160 MG tablet Take 1 tablet (160 mg total) by mouth daily. 03/31/15  Yes Tanda Rockers, MD  VENTOLIN HFA 108 (90 BASE) MCG/ACT inhaler INHALE 2 PUFFS Q 4 H PRN 02/22/15  Yes Historical Provider, MD  vitamin B-12 (CYANOCOBALAMIN) 500 MCG tablet Take 500 mcg by mouth daily.   Yes Historical Provider, MD  cefUROXime (CEFTIN) 250 MG tablet Take 1 tablet (250 mg total) by mouth 2 (two) times daily with a meal. Patient not taking: Reported on 03/31/2015 08/12/12   Nita Sells, MD    Allergies  Catapres; Covera-hs; Other; and Sulfa antibiotics  Triage Vitals: BP 148/70 mmHg  Pulse 81  Temp(Src) 98.5 F (36.9 C) (Oral)  Resp 18  SpO2 95%  Vital signs normal    Physical Exam  Constitutional: She is oriented to person, place, and time. She appears well-developed and well-nourished.  Non-toxic appearance. She does not appear ill. No distress.  HENT:  Head: Normocephalic and atraumatic.  Right Ear: External ear normal.  Left Ear: External ear normal.  Nose: Nose normal. No mucosal edema or rhinorrhea.  Mouth/Throat: Oropharynx is clear and moist and mucous membranes are normal. No dental abscesses or uvula swelling.  Eyes: Conjunctivae and EOM are normal. Pupils are equal, round, and reactive to light.  Neck: Normal range of motion and full passive range of motion without pain. Neck supple.  Cardiovascular: Normal rate and regular rhythm.  Exam reveals no gallop and no friction rub.   Murmur heard. high pitched systolic murmur heard best in the right upper sternal border  Pulmonary/Chest: Effort normal and breath sounds normal. No  respiratory distress. She has no wheezes. She has no rhonchi. She has no rales. She exhibits no tenderness and no crepitus.  Abdominal: Soft. Normal appearance and bowel sounds are normal. She exhibits no distension. There is tenderness in the periumbilical area. There is no rebound and no guarding.    Musculoskeletal: Normal range of motion. She exhibits no edema or tenderness.  Moves all extremities well.   Neurological: She is alert and oriented to person, place, and time. She has normal strength. No cranial nerve deficit.  Skin: Skin is warm, dry and intact. No rash noted. No erythema. No pallor.  Psychiatric: She has a normal mood and affect. Her speech is normal and behavior is normal. Her mood appears  not anxious.  Nursing note and vitals reviewed.   ED Course  Procedures Medications  0.9 %  sodium chloride infusion ( Intravenous New Bag/Given 04/08/15 0030)  fentaNYL (SUBLIMAZE) injection 50 mcg (50 mcg Intravenous Given 04/08/15 0030)  ondansetron (ZOFRAN) injection 4 mg (4 mg Intravenous Given 04/08/15 0030)     DIAGNOSTIC STUDIES: Oxygen Saturation is 95% on RA, normal by my interpretation.    COORDINATION OF CARE: 11:18 PM Discussed treatment plan which includes lab work, abdominal US, Zofran, fentanyl, and IVF  with pt at bedside and pt agreed to plan.  1:46 AM I re-evaluated the patient and provided an update on the results of her lab work and abdominal US. Her pain has improved after fentanyl. Plan for admission to the hospital.   02:12 Dr Hal Hope, admit to med-surg   Results for orders placed or performed during the hospital encounter of 04/07/15  Lipase, blood  Result Value Ref Range   Lipase 2374 (H) 22 - 51 U/L  Comprehensive metabolic panel  Result Value Ref Range   Sodium 138 135 - 145 mmol/L   Potassium 3.8 3.5 - 5.1 mmol/L   Chloride 100 (L) 101 - 111 mmol/L   CO2 30 22 - 32 mmol/L   Glucose, Bld 196 (H) 65 - 99 mg/dL   BUN 19 6 - 20 mg/dL    Creatinine, Ser 0.87 0.44 - 1.00 mg/dL   Calcium 9.7 8.9 - 10.3 mg/dL   Total Protein 7.4 6.5 - 8.1 g/dL   Albumin 3.8 3.5 - 5.0 g/dL   AST 375 (H) 15 - 41 U/L   ALT 216 (H) 14 - 54 U/L   Alkaline Phosphatase 130 (H) 38 - 126 U/L   Total Bilirubin 1.4 (H) 0.3 - 1.2 mg/dL   GFR calc non Af Amer 56 (L) >60 mL/min   GFR calc Af Amer >60 >60 mL/min   Anion gap 8 5 - 15  CBC  Result Value Ref Range   WBC 15.6 (H) 4.0 - 10.5 K/uL   RBC 4.69 3.87 - 5.11 MIL/uL   Hemoglobin 14.2 12.0 - 15.0 g/dL   HCT 44.5 36.0 - 46.0 %   MCV 94.9 78.0 - 100.0 fL   MCH 30.3 26.0 - 34.0 pg   MCHC 31.9 30.0 - 36.0 g/dL   RDW 14.0 11.5 - 15.5 %   Platelets 161 150 - 400 K/uL  Urinalysis, Routine w reflex microscopic (not at [facility])  Result Value Ref Range   Color, Urine YELLOW YELLOW   APPearance TURBID (A) CLEAR   Specific Gravity, Urine 1.016 1.005 - 1.030   pH 6.0 5.0 - 8.0   Glucose, UA NEGATIVE NEGATIVE mg/dL   Hgb urine dipstick MODERATE (A) NEGATIVE   Bilirubin Urine NEGATIVE NEGATIVE   Ketones, ur NEGATIVE NEGATIVE mg/dL   Protein, ur 30 (A) NEGATIVE mg/dL   Urobilinogen, UA 1.0 0.0 - 1.0 mg/dL   Nitrite NEGATIVE NEGATIVE   Leukocytes, UA LARGE (A) NEGATIVE  Urine microscopic-add on  Result Value Ref Range   Urine-Other FIELD OBSCURED BY RBC'S    Laboratory interpretation all normal except elevated LFTs, very elevated lipase, hyperglycemia, leukocytosis, hematuria     I, Rolland Porter, MD, personally reviewed and evaluated these images and lab results as part of my medical decision-making.  Imaging Review US Abdomen Limited Ruq  04/08/2015   CLINICAL DATA:  81 year old female with epigastric and right upper quadrant abdominal pain and elevated LFTs. Question gallstones.  EXAM: US ABDOMEN LIMITED -  RIGHT UPPER QUADRANT  COMPARISON:  CT 08/07/2012  FINDINGS: Gallbladder:  Distended with 8 mm stone in the neck. No gallbladder wall thickening, wall thickness of 3 mm. No sonographic Murphy sign  noted.  Common bile duct:  Diameter: 13.8 mm, distended. The more distal common bile duct is obscured.  Liver:  No focal lesion identified. Within normal limits in parenchymal echogenicity.  IMPRESSION: 1. Distended gallbladder with cholelithiasis, 8 mm stone in the neck. No secondary findings of cholecystitis. 2. Distended common bile duct, up to 13.8 mm. However, this appears unchanged on comparison to prior CT of 2013.   Electronically Signed   By: Jeb Levering M.D.   On: 04/08/2015 00:28      EKG Interpretation None     MDM   patient has epigastric abdominal pain with significant elevation of her lipase and on her ultrasound she has gallstones with a 8 mm stone in the neck. She is being admitted for pain control and surgical evaluation once her laboratory tests improve.    Final diagnoses:  Gallstones  Acute biliary pancreatitis  Elevated liver enzymes  Epigastric abdominal pain   Plan admission  Rolland Porter, MD, FACEP   I personally performed the services described in this documentation, which was scribed in my presence. The recorded information has been reviewed and considered.  Rolland Porter, MD, Barbette Or, MD 04/08/15 [phone]

## 2015-04-07 NOTE — ED Notes (Signed)
Pt states she has been having trouble today with her abdomen and her stomach  Pt states it hurt her to breathe  Pt states she has had nausea and vomiting today  Denies diarrhea

## 2015-04-08 ENCOUNTER — Inpatient Hospital Stay (HOSPITAL_COMMUNITY): Payer: Medicare Other

## 2015-04-08 ENCOUNTER — Encounter (HOSPITAL_COMMUNITY): Payer: Self-pay | Admitting: General Surgery

## 2015-04-08 DIAGNOSIS — Z833 Family history of diabetes mellitus: Secondary | ICD-10-CM | POA: Diagnosis not present

## 2015-04-08 DIAGNOSIS — R278 Other lack of coordination: Secondary | ICD-10-CM | POA: Diagnosis not present

## 2015-04-08 DIAGNOSIS — I340 Nonrheumatic mitral (valve) insufficiency: Secondary | ICD-10-CM

## 2015-04-08 DIAGNOSIS — H9190 Unspecified hearing loss, unspecified ear: Secondary | ICD-10-CM | POA: Diagnosis present

## 2015-04-08 DIAGNOSIS — R109 Unspecified abdominal pain: Secondary | ICD-10-CM | POA: Diagnosis not present

## 2015-04-08 DIAGNOSIS — K8064 Calculus of gallbladder and bile duct with chronic cholecystitis without obstruction: Secondary | ICD-10-CM | POA: Diagnosis not present

## 2015-04-08 DIAGNOSIS — I10 Essential (primary) hypertension: Secondary | ICD-10-CM

## 2015-04-08 DIAGNOSIS — E119 Type 2 diabetes mellitus without complications: Secondary | ICD-10-CM

## 2015-04-08 DIAGNOSIS — R079 Chest pain, unspecified: Secondary | ICD-10-CM | POA: Diagnosis not present

## 2015-04-08 DIAGNOSIS — Z01810 Encounter for preprocedural cardiovascular examination: Secondary | ICD-10-CM

## 2015-04-08 DIAGNOSIS — D696 Thrombocytopenia, unspecified: Secondary | ICD-10-CM | POA: Diagnosis not present

## 2015-04-08 DIAGNOSIS — K8020 Calculus of gallbladder without cholecystitis without obstruction: Secondary | ICD-10-CM | POA: Diagnosis not present

## 2015-04-08 DIAGNOSIS — R0600 Dyspnea, unspecified: Secondary | ICD-10-CM | POA: Diagnosis not present

## 2015-04-08 DIAGNOSIS — K660 Peritoneal adhesions (postprocedural) (postinfection): Secondary | ICD-10-CM | POA: Diagnosis present

## 2015-04-08 DIAGNOSIS — I5032 Chronic diastolic (congestive) heart failure: Secondary | ICD-10-CM | POA: Diagnosis not present

## 2015-04-08 DIAGNOSIS — I129 Hypertensive chronic kidney disease with stage 1 through stage 4 chronic kidney disease, or unspecified chronic kidney disease: Secondary | ICD-10-CM | POA: Diagnosis present

## 2015-04-08 DIAGNOSIS — E780 Pure hypercholesterolemia: Secondary | ICD-10-CM | POA: Diagnosis present

## 2015-04-08 DIAGNOSIS — Z5189 Encounter for other specified aftercare: Secondary | ICD-10-CM | POA: Diagnosis not present

## 2015-04-08 DIAGNOSIS — K219 Gastro-esophageal reflux disease without esophagitis: Secondary | ICD-10-CM | POA: Diagnosis present

## 2015-04-08 DIAGNOSIS — K30 Functional dyspepsia: Secondary | ICD-10-CM | POA: Diagnosis present

## 2015-04-08 DIAGNOSIS — K859 Acute pancreatitis, unspecified: Secondary | ICD-10-CM | POA: Diagnosis not present

## 2015-04-08 DIAGNOSIS — K8066 Calculus of gallbladder and bile duct with acute and chronic cholecystitis without obstruction: Secondary | ICD-10-CM | POA: Diagnosis not present

## 2015-04-08 DIAGNOSIS — K8040 Calculus of bile duct with cholecystitis, unspecified, without obstruction: Secondary | ICD-10-CM | POA: Diagnosis not present

## 2015-04-08 DIAGNOSIS — R262 Difficulty in walking, not elsewhere classified: Secondary | ICD-10-CM | POA: Diagnosis not present

## 2015-04-08 DIAGNOSIS — R1013 Epigastric pain: Secondary | ICD-10-CM | POA: Diagnosis not present

## 2015-04-08 DIAGNOSIS — E876 Hypokalemia: Secondary | ICD-10-CM | POA: Diagnosis not present

## 2015-04-08 DIAGNOSIS — K851 Biliary acute pancreatitis without necrosis or infection: Secondary | ICD-10-CM | POA: Diagnosis present

## 2015-04-08 DIAGNOSIS — Z8249 Family history of ischemic heart disease and other diseases of the circulatory system: Secondary | ICD-10-CM | POA: Diagnosis not present

## 2015-04-08 DIAGNOSIS — R32 Unspecified urinary incontinence: Secondary | ICD-10-CM | POA: Diagnosis present

## 2015-04-08 DIAGNOSIS — K819 Cholecystitis, unspecified: Secondary | ICD-10-CM | POA: Diagnosis not present

## 2015-04-08 DIAGNOSIS — E785 Hyperlipidemia, unspecified: Secondary | ICD-10-CM

## 2015-04-08 DIAGNOSIS — M6281 Muscle weakness (generalized): Secondary | ICD-10-CM | POA: Diagnosis not present

## 2015-04-08 DIAGNOSIS — K915 Postcholecystectomy syndrome: Secondary | ICD-10-CM | POA: Diagnosis not present

## 2015-04-08 DIAGNOSIS — K838 Other specified diseases of biliary tract: Secondary | ICD-10-CM | POA: Diagnosis not present

## 2015-04-08 DIAGNOSIS — K8010 Calculus of gallbladder with chronic cholecystitis without obstruction: Secondary | ICD-10-CM | POA: Diagnosis not present

## 2015-04-08 DIAGNOSIS — K8012 Calculus of gallbladder with acute and chronic cholecystitis without obstruction: Secondary | ICD-10-CM | POA: Diagnosis not present

## 2015-04-08 LAB — BASIC METABOLIC PANEL
ANION GAP: 5 (ref 5–15)
BUN: 16 mg/dL (ref 6–20)
CALCIUM: 9 mg/dL (ref 8.9–10.3)
CO2: 29 mmol/L (ref 22–32)
Chloride: 107 mmol/L (ref 101–111)
Creatinine, Ser: 0.75 mg/dL (ref 0.44–1.00)
Glucose, Bld: 130 mg/dL — ABNORMAL HIGH (ref 65–99)
POTASSIUM: 3.7 mmol/L (ref 3.5–5.1)
Sodium: 141 mmol/L (ref 135–145)

## 2015-04-08 LAB — HEPATIC FUNCTION PANEL
ALBUMIN: 3.1 g/dL — AB (ref 3.5–5.0)
ALK PHOS: 95 U/L (ref 38–126)
ALT: 154 U/L — AB (ref 14–54)
AST: 158 U/L — ABNORMAL HIGH (ref 15–41)
BILIRUBIN INDIRECT: 0.4 mg/dL (ref 0.3–0.9)
Bilirubin, Direct: 0.1 mg/dL (ref 0.1–0.5)
TOTAL PROTEIN: 6.1 g/dL — AB (ref 6.5–8.1)
Total Bilirubin: 0.5 mg/dL (ref 0.3–1.2)

## 2015-04-08 LAB — GLUCOSE, CAPILLARY
GLUCOSE-CAPILLARY: 119 mg/dL — AB (ref 65–99)
GLUCOSE-CAPILLARY: 138 mg/dL — AB (ref 65–99)
GLUCOSE-CAPILLARY: 75 mg/dL (ref 65–99)
GLUCOSE-CAPILLARY: 93 mg/dL (ref 65–99)
Glucose-Capillary: 102 mg/dL — ABNORMAL HIGH (ref 65–99)
Glucose-Capillary: 97 mg/dL (ref 65–99)

## 2015-04-08 LAB — CBC
HEMATOCRIT: 39.1 % (ref 36.0–46.0)
HEMOGLOBIN: 12.4 g/dL (ref 12.0–15.0)
MCH: 30.2 pg (ref 26.0–34.0)
MCHC: 31.7 g/dL (ref 30.0–36.0)
MCV: 95.1 fL (ref 78.0–100.0)
Platelets: 142 10*3/uL — ABNORMAL LOW (ref 150–400)
RBC: 4.11 MIL/uL (ref 3.87–5.11)
RDW: 14.1 % (ref 11.5–15.5)
WBC: 11.2 10*3/uL — ABNORMAL HIGH (ref 4.0–10.5)

## 2015-04-08 LAB — LIPASE, BLOOD: LIPASE: 1645 U/L — AB (ref 22–51)

## 2015-04-08 LAB — LIPID PANEL
CHOL/HDL RATIO: 2.4 ratio
CHOLESTEROL: 147 mg/dL (ref 0–200)
HDL: 62 mg/dL (ref >40)
LDL Cholesterol: 75 mg/dL (ref 0–99)
TRIGLYCERIDES: 52 mg/dL (ref <150)
VLDL: 10 mg/dL (ref 0–40)

## 2015-04-08 MED ORDER — SODIUM CHLORIDE 0.9 % IV SOLN: INTRAVENOUS | Status: DC

## 2015-04-08 MED ORDER — HYDRALAZINE HCL 20 MG/ML IJ SOLN
10.0000 mg | INTRAMUSCULAR | Status: DC | PRN
  Administered 2015-04-09: 10 mg via INTRAVENOUS
  Filled 2015-04-08: qty 1

## 2015-04-08 MED ORDER — ONDANSETRON HCL 4 MG PO TABS: 4.0000 mg | ORAL_TABLET | Freq: Four times a day (QID) | ORAL | Status: DC | PRN

## 2015-04-08 MED ORDER — PIPERACILLIN-TAZOBACTAM 3.375 G IVPB
3.3750 g | Freq: Three times a day (TID) | INTRAVENOUS | Status: DC
  Administered 2015-04-08 – 2015-04-11 (×9): 3.375 g via INTRAVENOUS
  Filled 2015-04-08 (×10): qty 50

## 2015-04-08 MED ORDER — FENTANYL CITRATE (PF) 100 MCG/2ML IJ SOLN: 50.0000 ug | INTRAMUSCULAR | Status: DC | PRN

## 2015-04-08 MED ORDER — ACETAMINOPHEN 650 MG RE SUPP
650.0000 mg | Freq: Four times a day (QID) | RECTAL | Status: DC | PRN
  Filled 2015-04-08: qty 1

## 2015-04-08 MED ORDER — ONDANSETRON HCL 4 MG/2ML IJ SOLN: 4.0000 mg | Freq: Four times a day (QID) | INTRAMUSCULAR | Status: DC | PRN

## 2015-04-08 MED ORDER — POLYVINYL ALCOHOL 1.4 % OP SOLN
2.0000 [drp] | OPHTHALMIC | Status: DC | PRN
  Administered 2015-04-08 – 2015-04-12 (×6): 2 [drp] via OPHTHALMIC
  Filled 2015-04-08: qty 15

## 2015-04-08 MED ORDER — LEVALBUTEROL HCL 1.25 MG/0.5ML IN NEBU
1.2500 mg | INHALATION_SOLUTION | RESPIRATORY_TRACT | Status: DC | PRN
  Administered 2015-04-12 – 2015-04-13 (×3): 1.25 mg via RESPIRATORY_TRACT
  Filled 2015-04-08 (×4): qty 0.5

## 2015-04-08 MED ORDER — METOPROLOL TARTRATE 1 MG/ML IV SOLN
5.0000 mg | Freq: Four times a day (QID) | INTRAVENOUS | Status: DC
  Administered 2015-04-08 – 2015-04-09 (×6): 5 mg via INTRAVENOUS
  Filled 2015-04-08 (×9): qty 5

## 2015-04-08 MED ORDER — ACETAMINOPHEN 325 MG PO TABS
650.0000 mg | ORAL_TABLET | Freq: Four times a day (QID) | ORAL | Status: DC | PRN
  Administered 2015-04-12: 650 mg via ORAL
  Filled 2015-04-08: qty 2

## 2015-04-08 MED ORDER — INSULIN ASPART 100 UNIT/ML ~~LOC~~ SOLN
0.0000 [IU] | SUBCUTANEOUS | Status: DC
  Administered 2015-04-08: 1 [IU] via SUBCUTANEOUS
  Administered 2015-04-10 (×2): 2 [IU] via SUBCUTANEOUS
  Administered 2015-04-11: 1 [IU] via SUBCUTANEOUS

## 2015-04-08 MED ORDER — ONDANSETRON HCL 4 MG/2ML IJ SOLN: 4.0000 mg | Freq: Three times a day (TID) | INTRAMUSCULAR | Status: DC | PRN

## 2015-04-08 MED ORDER — PIPERACILLIN-TAZOBACTAM 3.375 G IVPB
3.3750 g | Freq: Once | INTRAVENOUS | Status: AC
  Administered 2015-04-08: 3.375 g via INTRAVENOUS
  Filled 2015-04-08: qty 50

## 2015-04-08 MED ORDER — SODIUM CHLORIDE 0.9 % IV SOLN
INTRAVENOUS | Status: DC
  Administered 2015-04-08: 03:00:00 via INTRAVENOUS

## 2015-04-08 MED ORDER — MORPHINE SULFATE (PF) 2 MG/ML IV SOLN: 1.0000 mg | INTRAVENOUS | Status: DC | PRN

## 2015-04-08 NOTE — Consult Note (Signed)
Cardiology Consultation Note  Patient ID: Heidi Dominguez, MRN: [id number], DOB/AGE: Dec 20, 1920 81 y.o. Admit date: 04/07/2015   Date of Consult: 04/08/2015 Primary Physician: Mathews Argyle, MD Primary Cardiologist: Previously Dr. Doylene Canard but patient requested to switch - was recently wanting to establish care with Dr. Daneen Schick  Chief Complaint: abdominal pain Reason for Consultation: pre-op clearance (gallstone pancreatitis)  HPI: Heidi Dominguez is a 81 y/o F with history of HTN, DM2, chronic diastolic CHF, moderate MR by echo 2013, HLD, GERD, OA who presented to [facility] with abdominal pain. It began as epigastric then further localized to RUQ. She had associated N/V. Workup thus far has shown elevated LFTs, markedly elevated lipase, WBC 15.6k, US shows gallstones with CBD dilitation and 56mm gallstone in the neck of the gallbladder. She was admitted for IV fluids and pain control for gallstone pancreatitis. General surgery plans to consider cholecystectomy once pancreatitis resolves, but they are not sure yet if surgical risks outweigh benefits. Last echo 02/2012: mod LVH, EF 60-65%, mild HK of mid-inferoseptal myocardium , grade 1 DD, trivial AI, mod MR, mildly dilated LA. Nuclear stress test in 2013 was normal, EF 57%. These studies were done when she was admitted at tha ttime for vague CP, SOB, and found to have diastolic CHF which responded well to IV diuresis. At this time she said she no longer wants to see Dr. Doylene Canard, thus our team was consulted to see her for surgical clearance. Home meds of cardiac significance include aspirin, amlodipine, lasix 20mg  daily, valsartan, labetalol. As she is NPO for now, she is on metoprolol 5mg  IV q4hr. Labs also notable for normal Cr, WBC improving.  She recently self-referred to pulmonology for dyspnea and wheezing with exertion. She says this has been going on for about a month but her daughter then reminded her it's actually been going on for about a year.  She can walk about a block before feeling SOB and wheezing. She was seen by Dr. Melvyn Novas 03/31/15 at which time her ACEI was changed to ARB, added Xopenex PRN, with plans to re-eval changing to more selective BB if no improvement in sx at follow-up appt. Last CXR 03/31/15 showed cardiomegaly but no other acute changes. She has noticed mild improvement in dyspnea since switch from ACEI->ARB. She denies any chest pain. She does get rare "indigestion" after large meals. There is no history of exertional chest discomfort. She reports intermittent LEE which she feels seems OK today. No orthopnea. IV fluids are being scaled back due to h/o CHF. Weight appears generally stable in Epic.  Past Medical History  Diagnosis Date  . Diabetes mellitus   . Hypertension   . High cholesterol   . Osteoarthritis   . Degenerative joint disease of shoulder region   . HOH (hard of hearing)   . Complication of anesthesia     I have a hard time waking up"  . Chronic diastolic CHF (congestive heart failure)   . Pneumonia   . GERD (gastroesophageal reflux disease)       Surgical History:  Past Surgical History  Procedure Laterality Date  . Shoulder surgery    . Knee surgery    . Back surgery    . Abdominal hysterectomy    . Cataracts      bilateral     Home Meds: Prior to Admission medications   Medication Sig Start Date End Date Taking? Authorizing Provider  acetaminophen (TYLENOL) 325 MG tablet Take 2 tablets (650 mg total) by mouth  every 6 (six) hours as needed (or Fever >/= 101). 08/12/12  Yes Nita Sells, MD  amLODipine (NORVASC) 5 MG tablet Take 1 tablet (5 mg total) by mouth daily. 03/31/15  Yes Tanda Rockers, MD  aspirin 81 MG tablet Take 81 mg by mouth daily.   Yes Historical Provider, MD  benzonatate (TESSALON) 100 MG capsule Take by mouth 3 (three) times daily as needed for cough.   Yes Historical Provider, MD  Cholecalciferol (VITAMIN D3) 5000 UNITS CAPS Take 1 capsule by mouth daily.   Yes  Historical Provider, MD  Ferrous Gluconate (IRON) 240 (27 FE) MG TABS Take 1 tablet by mouth daily.   Yes Historical Provider, MD  furosemide (LASIX) 20 MG tablet 1 TABLET (20 MG) BY MOUTH DAILY 03/22/15  Yes Historical Provider, MD  JANUVIA 50 MG tablet 1 TABLET (50 MG) BY MOUTH DAILY 03/11/15  Yes Historical Provider, MD  labetalol (NORMODYNE) 200 MG tablet Take 200 mg by mouth 2 (two) times daily.   Yes Historical Provider, MD  levalbuterol (XOPENEX) 1.25 MG/3ML nebulizer solution Take 1.25 mg by nebulization every 4 (four) hours as needed for wheezing.   Yes Historical Provider, MD  Magnesium 250 MG TABS Take 250 mg by mouth daily.    Yes Historical Provider, MD  mirabegron ER (MYRBETRIQ) 50 MG TB24 tablet Take 50 mg by mouth daily.   Yes Historical Provider, MD  potassium chloride (K-DUR,KLOR-CON) 10 MEQ tablet Take 10 mEq by mouth daily.  03/22/15  Yes Historical Provider, MD  pravastatin (PRAVACHOL) 40 MG tablet Take 40 mg by mouth daily.   Yes Historical Provider, MD  ranitidine (ZANTAC) 150 MG tablet Take 1 tablet (150 mg total) by mouth 2 (two) times daily. 02/29/12 04/07/16 Yes Dixie Dials, MD  valsartan (DIOVAN) 160 MG tablet Take 1 tablet (160 mg total) by mouth daily. 03/31/15  Yes Tanda Rockers, MD  VENTOLIN HFA 108 (90 BASE) MCG/ACT inhaler INHALE 2 PUFFS Q 4 H PRN 02/22/15  Yes Historical Provider, MD  vitamin B-12 (CYANOCOBALAMIN) 500 MCG tablet Take 500 mcg by mouth daily.   Yes Historical Provider, MD  cefUROXime (CEFTIN) 250 MG tablet Take 1 tablet (250 mg total) by mouth 2 (two) times daily with a meal. Patient not taking: Reported on 03/31/2015 08/12/12   Nita Sells, MD    Inpatient Medications:  . insulin aspart  0-9 Units Subcutaneous Q4H  . metoprolol  5 mg Intravenous 4 times per day  . piperacillin-tazobactam (ZOSYN)  IV  3.375 g Intravenous Q8H   . sodium chloride 50 mL/hr at 04/08/15 0413    Allergies:  Allergies  Allergen Reactions  . Catapres [Clonidine  Hcl] Other (See Comments)    Unknown.  Lorrin Goodell [Verapamil Hcl] Other (See Comments)    Unknown.  . Other Other (See Comments)    Any type of narcotics Feels "loopy"  . Sulfa Antibiotics Itching    Social History   Social History  . Marital Status: Widowed    Spouse Name: N/A  . Number of Children: N/A  . Years of Education: N/A   Occupational History  . Not on file.   Social History Main Topics  . Smoking status: Never Smoker   . Smokeless tobacco: Never Used  . Alcohol Use: No  . Drug Use: No  . Sexual Activity: Not on file   Other Topics Concern  . Not on file   Social History Narrative     Family History  Problem Relation Age  of Onset  . Hypertension Mother   . Diabetes Mellitus I Mother   . Hypertension Father      Review of Systems: All other systems reviewed and are otherwise negative except as noted above.  Labs:  Lab Results  Component Value Date   WBC 11.2* 04/08/2015   HGB 12.4 04/08/2015   HCT 39.1 04/08/2015   MCV 95.1 04/08/2015   PLT 142* 04/08/2015     Recent Labs Lab 04/08/15 0550  NA 141  K 3.7  CL 107  CO2 29  BUN 16  CREATININE 0.75  CALCIUM 9.0  PROT 6.1*  BILITOT 0.5  ALKPHOS 95  ALT 154*  AST 158*  GLUCOSE 130*   Lab Results  Component Value Date   CHOL 147 04/08/2015   HDL 62 04/08/2015   LDLCALC 75 04/08/2015   TRIG 52 04/08/2015   No results found for: DDIMER  Radiology/Studies:  Dg Chest 2 View  03/31/2015   CLINICAL DATA:  Cough and wheezing .  EXAM: CHEST  2 VIEW  COMPARISON:  03/13/2014.  FINDINGS: Mediastinum and hilar structures normal. Cardiomegaly with normal pulmonary vascularity. No focal pulmonary infiltrate. No pleural effusion or pneumothorax. Bilateral shoulder replacements.  IMPRESSION: 1. Cardiomegaly.  2.  No focal pulmonary infiltrates.   Electronically Signed   By: Marcello Moores  Register   On: 03/31/2015 14:23   US Abdomen Limited Ruq  04/08/2015   CLINICAL DATA:  81 year old female with  epigastric and right upper quadrant abdominal pain and elevated LFTs. Question gallstones.  EXAM: US ABDOMEN LIMITED - RIGHT UPPER QUADRANT  COMPARISON:  CT 08/07/2012  FINDINGS: Gallbladder:  Distended with 8 mm stone in the neck. No gallbladder wall thickening, wall thickness of 3 mm. No sonographic Murphy sign noted.  Common bile duct:  Diameter: 13.8 mm, distended. The more distal common bile duct is obscured.  Liver:  No focal lesion identified. Within normal limits in parenchymal echogenicity.  IMPRESSION: 1. Distended gallbladder with cholelithiasis, 8 mm stone in the neck. No secondary findings of cholecystitis. 2. Distended common bile duct, up to 13.8 mm. However, this appears unchanged on comparison to prior CT of 2013.   Electronically Signed   By: Jeb Levering M.D.   On: 04/08/2015 00:28    Wt Readings from Last 3 Encounters:  04/08/15 198 lb 6.6 oz (90 kg)  03/31/15 198 lb 9.6 oz (90.084 kg)  10/17/13 199 lb (90.266 kg)    EKG: NSR 77bpm, possible prior inferior infarct, otherwise no acute changes  Physical Exam: Blood pressure 149/68, pulse 81, temperature 98.5 F (36.9 C), temperature source Oral, resp. rate 19, height 5\' 6"  (1.676 m), weight 198 lb 6.6 oz (90 kg), SpO2 99 %. General: Well developed elderly AAF in no acute distress. Head: Normocephalic, atraumatic, sclera non-icteric, no xanthomas, nares are without discharge.  Neck: JVD not elevated. Lungs: Clear bilaterally to auscultation without wheezes, rales, or rhonchi. Breathing is unlabored. Heart: RRR with S1 S2. 2/6 SEM throughout entire precordium but also radiates to axilla. No rubs or gallops appreciated. Abdomen: Soft, non-tender, non-distended with normoactive bowel sounds. No hepatomegaly. No rebound/guarding. No obvious abdominal masses. Msk:  Strength and tone appear normal for age. Extremities: No clubbing or cyanosis. No edema.  Distal pedal pulses are 2+ and equal bilaterally. Neuro: Alert and oriented  X 3. No facial asymmetry. No focal deficit except slightly hard of hearing. Moves all extremities spontaneously. Psych:  Responds to questions appropriately with a normal affect.    Assessment  and Plan:   1. Pre-operative evaluation for gallstone pancreatitis - regardless of cardiac status, given her age, she will be high risk for surgery, but will discuss pre-operative eval with MD. Note negative nuc in 2013, no history of anginal-type chest pain. She has had DOE/wheezing x 1 yr and mitral regurgitation, thus will obtain updated echo. She is being "cooled off" so no actual surgery date is set yet.  2. Dyspnea/wheezing on exertion for 1 year - in process of outpatient evaluation by pulm. Mild improvement in symptoms with switch from [place]. Pulmonologist planned next step as switching BB to more selective agent which is reasonable. We can even try this as an inpatient once she is taking orals. Will also update echocardiogram.  3. Chronic diastolic CHF - appears compensated. Watch BP. Can consider using IV hydralazine if additional agents are needed to lower pressure until she's taking orals. Follow daily weights and strict I/Os.  4. Moderate mitral regurgitation - update echo as above.  SignedMelina Copa PA-C 04/08/2015, 11:45 AM Pager: [phone]  The patient was seen, examined and discussed with Melina Copa, PA-C and I agree with the above.   81 year old female admitted with gallstone pancreatitis, with planned gallbladder removal. She has h/o chronic diastolic CHF followed by Dr Tamala Julian.  She needs a preop evaluation - normal LVEF, grade 1 diastolic dysfunction in 8887 and normal stress test. She denies chest pain, but had some worsening SOB in the last year, she can certainly achieve 4 METS.  She appears euvolemic, we will order an echo and if her LVEF remains the same and she doesn't have severe MR, we won't proceed with further evaluation.  Dorothy Spark 04/08/2015

## 2015-04-08 NOTE — H&P (Signed)
[facility] History and Physical  MCKINZEE JOURDEN TFT:[id number] DOB: 1921/06/04 DOA: 04/07/2015  Referring physician: Dr. Lynelle Doctor. PCP: Ginette Otto, MD  Specialists: Dr. Madilyn Fireman. Gastroenterologist.  Chief Complaint: Abdominal pain.  HPI: Heidi Dominguez is a 81 y.o. female with history of diabetes mellitus type 2, hypertension, diastolic CHF presents to the ER because of abdominal pain. Patient's pain started yesterday which was epigastric which became more diffuse with multiple episodes of nausea and vomiting. In the ER patient on exam has a right upper quadrant pain. Labs reveal elevated LFTs with markedly elevated lipase. Sonogram of the abdomen shows gallstones with CBD dilation. There is also an 8 mm gallstone in the neck of the gallbladder. Patient was given pain relief medications and IV fluids and admitted for gallstone pancreatitis. Patient denies any chest pain or shortness of breath at this time.   Review of Systems: As presented in the history of presenting illness, rest negative.  Past Medical History  Diagnosis Date  . Diabetes mellitus   . Hypertension   . High cholesterol   . Osteoarthritis   . Degenerative joint disease of shoulder region   . HOH (hard of hearing)   . H/O knee surgery   . H/O shoulder surgery   . Complication of anesthesia     I have a hard time waking up"  . CHF (congestive heart failure)   . Pneumonia   . GERD (gastroesophageal reflux disease)   . Chronic kidney disease     incontinence/ fAMILYN DENIES HX OF CKD   Past Surgical History  Procedure Laterality Date  . Shoulder surgery    . Knee surgery    . Back surgery    . Abdominal hysterectomy    . Cataracts      bilateral   Social History:  reports that she has never smoked. She has never used smokeless tobacco. She reports that she does not drink alcohol or use illicit drugs. Where does patient live at home. Can patient participate in ADLs? Yes.  Allergies  Allergen  Reactions  . Catapres [Clonidine Hcl] Other (See Comments)    Unknown.  Harvel Quale [Verapamil Hcl] Other (See Comments)    Unknown.  . Other Other (See Comments)    Any type of narcotics Feels "loopy"  . Sulfa Antibiotics Itching    Family History: History reviewed. No pertinent family history. mother had hypertension and diabetes. Father has hypertension.  Prior to Admission medications   Medication Sig Start Date End Date Taking? Authorizing Provider  acetaminophen (TYLENOL) 325 MG tablet Take 2 tablets (650 mg total) by mouth every 6 (six) hours as needed (or Fever >/= 101). 08/12/12  Yes Rhetta Mura, MD  amLODipine (NORVASC) 5 MG tablet Take 1 tablet (5 mg total) by mouth daily. 03/31/15  Yes Nyoka Cowden, MD  aspirin 81 MG tablet Take 81 mg by mouth daily.   Yes Historical Provider, MD  benzonatate (TESSALON) 100 MG capsule Take by mouth 3 (three) times daily as needed for cough.   Yes Historical Provider, MD  Cholecalciferol (VITAMIN D3) 5000 UNITS CAPS Take 1 capsule by mouth daily.   Yes Historical Provider, MD  Ferrous Gluconate (IRON) 240 (27 FE) MG TABS Take 1 tablet by mouth daily.   Yes Historical Provider, MD  furosemide (LASIX) 20 MG tablet 1 TABLET (20 MG) BY MOUTH DAILY 03/22/15  Yes Historical Provider, MD  JANUVIA 50 MG tablet 1 TABLET (50 MG) BY MOUTH DAILY 03/11/15  Yes Historical Provider,  MD  labetalol (NORMODYNE) 200 MG tablet Take 200 mg by mouth 2 (two) times daily.   Yes Historical Provider, MD  levalbuterol (XOPENEX) 1.25 MG/3ML nebulizer solution Take 1.25 mg by nebulization every 4 (four) hours as needed for wheezing.   Yes Historical Provider, MD  Magnesium 250 MG TABS Take 250 mg by mouth daily.    Yes Historical Provider, MD  mirabegron ER (MYRBETRIQ) 50 MG TB24 tablet Take 50 mg by mouth daily.   Yes Historical Provider, MD  potassium chloride (K-DUR,KLOR-CON) 10 MEQ tablet Take 10 mEq by mouth daily.  03/22/15  Yes Historical Provider, MD   pravastatin (PRAVACHOL) 40 MG tablet Take 40 mg by mouth daily.   Yes Historical Provider, MD  ranitidine (ZANTAC) 150 MG tablet Take 1 tablet (150 mg total) by mouth 2 (two) times daily. 02/29/12 04/07/16 Yes Orpah Cobb, MD  valsartan (DIOVAN) 160 MG tablet Take 1 tablet (160 mg total) by mouth daily. 03/31/15  Yes Nyoka Cowden, MD  VENTOLIN HFA 108 (90 BASE) MCG/ACT inhaler INHALE 2 PUFFS Q 4 H PRN 02/22/15  Yes Historical Provider, MD  vitamin B-12 (CYANOCOBALAMIN) 500 MCG tablet Take 500 mcg by mouth daily.   Yes Historical Provider, MD  cefUROXime (CEFTIN) 250 MG tablet Take 1 tablet (250 mg total) by mouth 2 (two) times daily with a meal. Patient not taking: Reported on 03/31/2015 08/12/12   Rhetta Mura, MD    Physical Exam: Filed Vitals:   04/07/15 2201 04/08/15 0029 04/08/15 0214 04/08/15 0319  BP: 148/70 149/54 157/56 149/68  Pulse: 81 80 83 81  Temp: 98.5 F (36.9 C) 99 F (37.2 C)  98.5 F (36.9 C)  TempSrc: Oral Oral  Oral  Resp: 18 16 16 19   SpO2: 95% 93% 93% 99%     General:  Moderately built and nourished.  Eyes: Anicteric no pallor.  ENT: No discharge from the ears eyes nose and mouth.  Neck: No mass felt.  Cardiovascular: S1 and S2 heard.  Respiratory: No rhonchi or crepitations.  Abdomen: Right upper quadrant tenderness. No guarding or rigidity. Bowel sounds present.  Skin: No rash.  Musculoskeletal: No edema.  Psychiatric: Appears normal.  Neurologic: Alert awake oriented to time place and person. Moves all extremities.  Labs on Admission:  Basic Metabolic Panel:  Recent Labs Lab 04/07/15 2225  NA 138  K 3.8  CL 100*  CO2 30  GLUCOSE 196*  BUN 19  CREATININE 0.87  CALCIUM 9.7   Liver Function Tests:  Recent Labs Lab 04/07/15 2225  AST 375*  ALT 216*  ALKPHOS 130*  BILITOT 1.4*  PROT 7.4  ALBUMIN 3.8    Recent Labs Lab 04/07/15 2225  LIPASE 2374*   No results for input(s): AMMONIA in the last 168  hours. CBC:  Recent Labs Lab 04/07/15 2225  WBC 15.6*  HGB 14.2  HCT 44.5  MCV 94.9  PLT 161   Cardiac Enzymes: No results for input(s): CKTOTAL, CKMB, CKMBINDEX, TROPONINI in the last 168 hours.  BNP (last 3 results) No results for input(s): BNP in the last 8760 hours.  ProBNP (last 3 results) No results for input(s): PROBNP in the last 8760 hours.  CBG: No results for input(s): GLUCAP in the last 168 hours.  Radiological Exams on Admission: US Abdomen Limited Ruq  04/08/2015   CLINICAL DATA:  81 year old female with epigastric and right upper quadrant abdominal pain and elevated LFTs. Question gallstones.  EXAM: US ABDOMEN LIMITED - RIGHT UPPER QUADRANT  COMPARISON:  CT 08/07/2012  FINDINGS: Gallbladder:  Distended with 8 mm stone in the neck. No gallbladder wall thickening, wall thickness of 3 mm. No sonographic Murphy sign noted.  Common bile duct:  Diameter: 13.8 mm, distended. The more distal common bile duct is obscured.  Liver:  No focal lesion identified. Within normal limits in parenchymal echogenicity.  IMPRESSION: 1. Distended gallbladder with cholelithiasis, 8 mm stone in the neck. No secondary findings of cholecystitis. 2. Distended common bile duct, up to 13.8 mm. However, this appears unchanged on comparison to prior CT of 2013.   Electronically Signed   By: Rubye Oaks M.D.   On: 04/08/2015 00:28    Assessment/Plan Principal Problem:   Acute gallstone pancreatitis Active Problems:   Essential hypertension   Dyslipidemia   Diabetes mellitus type 2, controlled   Gallstone pancreatitis   1. Acute gallstone pancreatitis - I have discussed with on-call general surgeon Dr. Johna Sheriff who will be seeing patient in consult. I have placed patient nothing by mouth and on gentle hydration and empiric antibiotics with pain relief medications. 2. Hypertension - since patient is nothing by mouth I have placed patient on when necessary IV hydralazine and scheduled dose of  metoprolol. 3. Diabetes mellitus type 2 - since patient is nothing by mouth I'm holding Januvia and placing patient on sliding scale coverage. 4. History of dyslipidemia - check lipid panel. 5. History of diastolic CHF last EF measured in 2013 was 60-65% with grade 1 diastolic dysfunction - closely monitor respiratory status since patient is receiving IV fluids.  I have reviewed patient's chest x-ray personally and reviewed patient's old charts and labs. I have discussed with on-call general surgeon.   DVT Prophylaxis SCDs in anticipation of possible procedure.  Code Status: Full code.  Family Communication: Discussed with patient's son.  Disposition Plan: Admit to inpatient.    Sabryn Preslar N. [facility] Pager [phone].  If 7PM-7AM, please contact night-coverage [email or web address] Password [email or web address] 04/08/2015, 3:51 AM

## 2015-04-08 NOTE — Consult Note (Signed)
Reason for Consult: gallstone pancreatitis  Referring Physician: Dr. Tera Partridge    HPI: Heidi Dominguez is a 81 year old female with a history of HTN, diabetes mellitus, HLD, diastolic heart failure, OA presented to [facility] with abdominal pain.  Onset was sudden yesterday morning after breakfast.  Location is epigastric and chest without any radiation.  She denies previous symptoms although reports "indigestion" over the past month.  Symptoms were severe.  Characterized as sharp stabbing pain.  Associated with nausea and vomiting.  Work in ED showed a WBC of 15.6k, elevated LFTs with a total bilirubin 1.4.  Lipase was 2374.  Lastly, abdominal US showed a distended gallbladder with cholelithiasis, 77m stone in the neck of the gallbladder and a CBD 13.863mbut unchanged since 2013.    We have been asked to evaluate for surgery.  Today's labs showed a decreasing WBC, on zosyn.  Improving LFTs with a normal T bili.  Lipase is down to 1645.  She remains in pain, but overall better and no vomiting since last night.   Off note, she was seen by pulmonary 1.5 weeks ago for shortness of breath and a cough.  At that time her ACE was stopped.  The patient continues to have dyspnea on exertion.  Finds it difficult to walk in the grocery store which she was able to do before.  Reports ~5lb weight gain.  Denies orthopnea.  Last echocardiogram was in 2013 which showed LVEF 60-65%, stage I DD.  She has seen Dr. KaDoylene Canardn the past.    Apparently she has been told in the past that she should not undergo any surgeries.  Past Medical History  Diagnosis Date  . Diabetes mellitus   . Hypertension   . High cholesterol   . Osteoarthritis   . Degenerative joint disease of shoulder region   . HOH (hard of hearing)   . H/O knee surgery   . H/O shoulder surgery   . Complication of anesthesia     I have a hard time waking up"  . CHF (congestive heart failure)   . Pneumonia   . GERD (gastroesophageal reflux disease)   .  Chronic kidney disease     incontinence/ fAMILYN DENIES HX OF CKD    Past Surgical History  Procedure Laterality Date  . Shoulder surgery    . Knee surgery    . Back surgery    . Abdominal hysterectomy    . Cataracts      bilateral    History reviewed. No pertinent family history.  Social History:  reports that she has never smoked. She has never used smokeless tobacco. She reports that she does not drink alcohol or use illicit drugs.  Allergies:  Allergies  Allergen Reactions  . Catapres [Clonidine Hcl] Other (See Comments)    Unknown.  . Lorrin GoodellVerapamil Hcl] Other (See Comments)    Unknown.  . Other Other (See Comments)    Any type of narcotics Feels "loopy"  . Sulfa Antibiotics Itching    Medications:  No current facility-administered medications on file prior to encounter.   Current Outpatient Prescriptions on File Prior to Encounter  Medication Sig Dispense Refill  . acetaminophen (TYLENOL) 325 MG tablet Take 2 tablets (650 mg total) by mouth every 6 (six) hours as needed (or Fever >/= 101).    . Marland KitchenmLODipine (NORVASC) 5 MG tablet Take 1 tablet (5 mg total) by mouth daily. 30 tablet 11  . aspirin 81 MG tablet Take 81 mg by  mouth daily.    . benzonatate (TESSALON) 100 MG capsule Take by mouth 3 (three) times daily as needed for cough.    . Cholecalciferol (VITAMIN D3) 5000 UNITS CAPS Take 1 capsule by mouth daily.    . Ferrous Gluconate (IRON) 240 (27 FE) MG TABS Take 1 tablet by mouth daily.    . furosemide (LASIX) 20 MG tablet 1 TABLET (20 MG) BY MOUTH DAILY  6  . JANUVIA 50 MG tablet 1 TABLET (50 MG) BY MOUTH DAILY  5  . labetalol (NORMODYNE) 200 MG tablet Take 200 mg by mouth 2 (two) times daily.    Marland Kitchen levalbuterol (XOPENEX) 1.25 MG/3ML nebulizer solution Take 1.25 mg by nebulization every 4 (four) hours as needed for wheezing.    . Magnesium 250 MG TABS Take 250 mg by mouth daily.     . mirabegron ER (MYRBETRIQ) 50 MG TB24 tablet Take 50 mg by mouth daily.     . potassium chloride (K-DUR,KLOR-CON) 10 MEQ tablet Take 10 mEq by mouth daily.     . pravastatin (PRAVACHOL) 40 MG tablet Take 40 mg by mouth daily.    . ranitidine (ZANTAC) 150 MG tablet Take 1 tablet (150 mg total) by mouth 2 (two) times daily. 60 tablet 1  . valsartan (DIOVAN) 160 MG tablet Take 1 tablet (160 mg total) by mouth daily. 30 tablet 11  . VENTOLIN HFA 108 (90 BASE) MCG/ACT inhaler INHALE 2 PUFFS Q 4 H PRN  4  . vitamin B-12 (CYANOCOBALAMIN) 500 MCG tablet Take 500 mcg by mouth daily.    . cefUROXime (CEFTIN) 250 MG tablet Take 1 tablet (250 mg total) by mouth 2 (two) times daily with a meal. (Patient not taking: Reported on 03/31/2015) 2 tablet      Results for orders placed or performed during the hospital encounter of 04/07/15 (from the past 48 hour(s))  Lipase, blood     Status: Abnormal   Collection Time: 04/07/15 10:25 PM  Result Value Ref Range   Lipase 2374 (H) 22 - 51 U/L    Comment: RESULTS CONFIRMED BY MANUAL DILUTION  Comprehensive metabolic panel     Status: Abnormal   Collection Time: 04/07/15 10:25 PM  Result Value Ref Range   Sodium 138 135 - 145 mmol/L   Potassium 3.8 3.5 - 5.1 mmol/L   Chloride 100 (L) 101 - 111 mmol/L   CO2 30 22 - 32 mmol/L   Glucose, Bld 196 (H) 65 - 99 mg/dL   BUN 19 6 - 20 mg/dL   Creatinine, Ser 0.87 0.44 - 1.00 mg/dL   Calcium 9.7 8.9 - 10.3 mg/dL   Total Protein 7.4 6.5 - 8.1 g/dL   Albumin 3.8 3.5 - 5.0 g/dL   AST 375 (H) 15 - 41 U/L   ALT 216 (H) 14 - 54 U/L   Alkaline Phosphatase 130 (H) 38 - 126 U/L   Total Bilirubin 1.4 (H) 0.3 - 1.2 mg/dL   GFR calc non Af Amer 56 (L) >60 mL/min   GFR calc Af Amer >60 >60 mL/min    Comment: (NOTE) The eGFR has been calculated using the CKD EPI equation. This calculation has not been validated in all clinical situations. eGFR's persistently <60 mL/min signify possible Chronic Kidney Disease.    Anion gap 8 5 - 15  CBC     Status: Abnormal   Collection Time: 04/07/15 10:25 PM   Result Value Ref Range   WBC 15.6 (H) 4.0 - 10.5 K/uL  RBC 4.69 3.87 - 5.11 MIL/uL   Hemoglobin 14.2 12.0 - 15.0 g/dL   HCT 44.5 36.0 - 46.0 %   MCV 94.9 78.0 - 100.0 fL   MCH 30.3 26.0 - 34.0 pg   MCHC 31.9 30.0 - 36.0 g/dL   RDW 14.0 11.5 - 15.5 %   Platelets 161 150 - 400 K/uL  Urinalysis, Routine w reflex microscopic (not at [facility])     Status: Abnormal   Collection Time: 04/07/15 11:12 PM  Result Value Ref Range   Color, Urine YELLOW YELLOW   APPearance TURBID (A) CLEAR   Specific Gravity, Urine 1.016 1.005 - 1.030   pH 6.0 5.0 - 8.0   Glucose, UA NEGATIVE NEGATIVE mg/dL   Hgb urine dipstick MODERATE (A) NEGATIVE   Bilirubin Urine NEGATIVE NEGATIVE   Ketones, ur NEGATIVE NEGATIVE mg/dL   Protein, ur 30 (A) NEGATIVE mg/dL   Urobilinogen, UA 1.0 0.0 - 1.0 mg/dL   Nitrite NEGATIVE NEGATIVE   Leukocytes, UA LARGE (A) NEGATIVE  Urine microscopic-add on     Status: None   Collection Time: 04/07/15 11:12 PM  Result Value Ref Range   Urine-Other FIELD OBSCURED BY RBC'S     Comment: FIELD OBSCURED BY WBC'S  Glucose, capillary     Status: Abnormal   Collection Time: 04/08/15  4:24 AM  Result Value Ref Range   Glucose-Capillary 138 (H) 65 - 99 mg/dL  Hepatic function panel     Status: Abnormal   Collection Time: 04/08/15  5:50 AM  Result Value Ref Range   Total Protein 6.1 (L) 6.5 - 8.1 g/dL   Albumin 3.1 (L) 3.5 - 5.0 g/dL   AST 158 (H) 15 - 41 U/L   ALT 154 (H) 14 - 54 U/L   Alkaline Phosphatase 95 38 - 126 U/L   Total Bilirubin 0.5 0.3 - 1.2 mg/dL   Bilirubin, Direct 0.1 0.1 - 0.5 mg/dL   Indirect Bilirubin 0.4 0.3 - 0.9 mg/dL  Lipase, blood     Status: Abnormal   Collection Time: 04/08/15  5:50 AM  Result Value Ref Range   Lipase 1645 (H) 22 - 51 U/L    Comment: RESULTS CONFIRMED BY MANUAL DILUTION  Basic metabolic panel     Status: Abnormal   Collection Time: 04/08/15  5:50 AM  Result Value Ref Range   Sodium 141 135 - 145 mmol/L   Potassium 3.7 3.5 - 5.1 mmol/L    Chloride 107 101 - 111 mmol/L   CO2 29 22 - 32 mmol/L   Glucose, Bld 130 (H) 65 - 99 mg/dL   BUN 16 6 - 20 mg/dL   Creatinine, Ser 0.75 0.44 - 1.00 mg/dL   Calcium 9.0 8.9 - 10.3 mg/dL   GFR calc non Af Amer >60 >60 mL/min   GFR calc Af Amer >60 >60 mL/min    Comment: (NOTE) The eGFR has been calculated using the CKD EPI equation. This calculation has not been validated in all clinical situations. eGFR's persistently <60 mL/min signify possible Chronic Kidney Disease.    Anion gap 5 5 - 15  CBC     Status: Abnormal   Collection Time: 04/08/15  5:50 AM  Result Value Ref Range   WBC 11.2 (H) 4.0 - 10.5 K/uL   RBC 4.11 3.87 - 5.11 MIL/uL   Hemoglobin 12.4 12.0 - 15.0 g/dL   HCT 39.1 36.0 - 46.0 %   MCV 95.1 78.0 - 100.0 fL   MCH 30.2 26.0 -  34.0 pg   MCHC 31.7 30.0 - 36.0 g/dL   RDW 14.1 11.5 - 15.5 %   Platelets 142 (L) 150 - 400 K/uL    US Abdomen Limited Ruq  04/08/2015   CLINICAL DATA:  80 year old female with epigastric and right upper quadrant abdominal pain and elevated LFTs. Question gallstones.  EXAM: US ABDOMEN LIMITED - RIGHT UPPER QUADRANT  COMPARISON:  CT 08/07/2012  FINDINGS: Gallbladder:  Distended with 8 mm stone in the neck. No gallbladder wall thickening, wall thickness of 3 mm. No sonographic Murphy sign noted.  Common bile duct:  Diameter: 13.8 mm, distended. The more distal common bile duct is obscured.  Liver:  No focal lesion identified. Within normal limits in parenchymal echogenicity.  IMPRESSION: 1. Distended gallbladder with cholelithiasis, 8 mm stone in the neck. No secondary findings of cholecystitis. 2. Distended common bile duct, up to 13.8 mm. However, this appears unchanged on comparison to prior CT of 2013.   Electronically Signed   By: Jeb Levering M.D.   On: 04/08/2015 00:28    Review of Systems  Constitutional: Negative for fever, chills, weight loss, malaise/fatigue and diaphoresis.  HENT: Positive for hearing loss.   Eyes: Negative for  blurred vision, double vision, photophobia, pain, discharge and redness.  Respiratory: Positive for shortness of breath and wheezing. Negative for cough, hemoptysis and sputum production.   Cardiovascular: Negative for chest pain, palpitations, orthopnea, claudication, leg swelling and PND.  Gastrointestinal: Positive for heartburn, nausea, vomiting and abdominal pain. Negative for diarrhea, constipation, blood in stool and melena.  Genitourinary: Negative for dysuria, urgency, frequency, hematuria and flank pain.  Neurological: Positive for headaches. Negative for dizziness, tingling, tremors, sensory change, speech change, focal weakness, seizures, loss of consciousness and weakness.  Psychiatric/Behavioral: Negative for depression.   Blood pressure 149/68, pulse 81, temperature 98.5 F (36.9 C), temperature source Oral, resp. rate 19, weight 90 kg (198 lb 6.6 oz), SpO2 99 %. Physical Exam  Constitutional: She is oriented to person, place, and time. She appears well-developed and well-nourished. No distress.  Cardiovascular: Normal rate, regular rhythm and intact distal pulses.  Exam reveals no gallop and no friction rub.   Murmur heard. 3/6 SEM  Respiratory: Effort normal and breath sounds normal. No respiratory distress. She has no wheezes.  GI: Soft. Bowel sounds are normal. She exhibits no distension and no mass. There is no rebound.  Moderate TTP RUQ/epigastric region  Musculoskeletal: Normal range of motion. She exhibits no edema or tenderness.  Neurological: She is alert and oriented to person, place, and time.  Skin: Skin is warm and dry. No rash noted. She is not diaphoretic. No erythema. No pallor.  Psychiatric: She has a normal mood and affect. Her behavior is normal. Judgment and thought content normal.    Assessment/Plan: Gallstone pancreatitis: The patient is at an increased risk of having recurring gallstone pancreatitis and/or bilary symptoms, however, at this point I'm not  sure if surgical risks benefits outweigh her risks.  Given her history of CHF, age and recent increase in dyspnea, we will have cardiology evaluate risks for surgery.  Furthermore, pancreatitis needs to improve.  She may have ice chips.  On [place] her gallbladder is distended, but there is no pericholecystitic fluid or sonographic murphy's sign.  Not sure how beneficial the antibiotics are, but will leave up to primary team.   Thank you for the consult.  Surgery will follow along.     Kennette Cuthrell ANP-BC 04/08/2015, 9:31 AM

## 2015-04-08 NOTE — Telephone Encounter (Signed)
Note should read stop advair and just use xopenex prn for now  ATC and inform the pt, NA and no option to leave a vm, WCB

## 2015-04-08 NOTE — Progress Notes (Signed)
ANTIBIOTIC CONSULT NOTE - INITIAL  Pharmacy Consult for Zosyn Indication: Intra-abdominal infection  Allergies  Allergen Reactions  . Catapres [Clonidine Hcl] Other (See Comments)    Unknown.  Lorrin Goodell [Verapamil Hcl] Other (See Comments)    Unknown.  . Other Other (See Comments)    Any type of narcotics Feels "loopy"  . Sulfa Antibiotics Itching    Patient Measurements:   Wt=90.1 kg  Vital Signs: Temp: 98.5 F (36.9 C) (08/25 0319) Temp Source: Oral (08/25 0319) BP: 149/68 mmHg (08/25 0319) Pulse Rate: 81 (08/25 0319) Intake/Output from previous day:   Intake/Output from this shift:    Labs:  Recent Labs  04/07/15 2225  WBC 15.6*  HGB 14.2  PLT 161  CREATININE 0.87   Estimated Creatinine Clearance: 44.9 mL/min (by C-G formula based on Cr of 0.87). No results for input(s): VANCOTROUGH, VANCOPEAK, VANCORANDOM, GENTTROUGH, GENTPEAK, GENTRANDOM, TOBRATROUGH, TOBRAPEAK, TOBRARND, AMIKACINPEAK, AMIKACINTROU, AMIKACIN in the last 72 hours.   Microbiology: No results found for this or any previous visit (from the past 720 hour(s)).  Medical History: Past Medical History  Diagnosis Date  . Diabetes mellitus   . Hypertension   . High cholesterol   . Osteoarthritis   . Degenerative joint disease of shoulder region   . HOH (hard of hearing)   . H/O knee surgery   . H/O shoulder surgery   . Complication of anesthesia     I have a hard time waking up"  . CHF (congestive heart failure)   . Pneumonia   . GERD (gastroesophageal reflux disease)   . Chronic kidney disease     incontinence/ fAMILYN DENIES HX OF CKD    Medications:  Prescriptions prior to admission  Medication Sig Dispense Refill Last Dose  . acetaminophen (TYLENOL) 325 MG tablet Take 2 tablets (650 mg total) by mouth every 6 (six) hours as needed (or Fever >/= 101).   04/07/2015 at Unknown time  . amLODipine (NORVASC) 5 MG tablet Take 1 tablet (5 mg total) by mouth daily. 30 tablet 11 04/07/2015  at Unknown time  . aspirin 81 MG tablet Take 81 mg by mouth daily.   04/07/2015 at Unknown time  . benzonatate (TESSALON) 100 MG capsule Take by mouth 3 (three) times daily as needed for cough.   unknown  . Cholecalciferol (VITAMIN D3) 5000 UNITS CAPS Take 1 capsule by mouth daily.   04/07/2015 at Unknown time  . Ferrous Gluconate (IRON) 240 (27 FE) MG TABS Take 1 tablet by mouth daily.   04/07/2015 at Unknown time  . furosemide (LASIX) 20 MG tablet 1 TABLET (20 MG) BY MOUTH DAILY  6 04/07/2015 at Unknown time  . JANUVIA 50 MG tablet 1 TABLET (50 MG) BY MOUTH DAILY  5 04/07/2015 at Unknown time  . labetalol (NORMODYNE) 200 MG tablet Take 200 mg by mouth 2 (two) times daily.   04/07/2015 at 1900  . levalbuterol (XOPENEX) 1.25 MG/3ML nebulizer solution Take 1.25 mg by nebulization every 4 (four) hours as needed for wheezing.   04/07/2015 at Unknown time  . Magnesium 250 MG TABS Take 250 mg by mouth daily.    04/07/2015 at Unknown time  . mirabegron ER (MYRBETRIQ) 50 MG TB24 tablet Take 50 mg by mouth daily.   04/07/2015 at Unknown time  . potassium chloride (K-DUR,KLOR-CON) 10 MEQ tablet Take 10 mEq by mouth daily.    04/07/2015 at Unknown time  . pravastatin (PRAVACHOL) 40 MG tablet Take 40 mg by mouth daily.  04/07/2015 at Unknown time  . ranitidine (ZANTAC) 150 MG tablet Take 1 tablet (150 mg total) by mouth 2 (two) times daily. 60 tablet 1 04/07/2015 at Unknown time  . valsartan (DIOVAN) 160 MG tablet Take 1 tablet (160 mg total) by mouth daily. 30 tablet 11 04/07/2015 at Unknown time  . VENTOLIN HFA 108 (90 BASE) MCG/ACT inhaler INHALE 2 PUFFS Q 4 H PRN  4 04/07/2015 at Unknown time  . vitamin B-12 (CYANOCOBALAMIN) 500 MCG tablet Take 500 mcg by mouth daily.   Past Week at Unknown time  . cefUROXime (CEFTIN) 250 MG tablet Take 1 tablet (250 mg total) by mouth 2 (two) times daily with a meal. (Patient not taking: Reported on 03/31/2015) 2 tablet  Not Taking   Scheduled:  . insulin aspart  0-9 Units  Subcutaneous Q4H  . metoprolol  5 mg Intravenous 4 times per day  . piperacillin-tazobactam (ZOSYN)  IV  3.375 g Intravenous Once   Infusions:  . sodium chloride 50 mL/hr at 04/08/15 0413   Assessment: 89 yoF c/o abdominal pain.  Zosyn per Rx.   Goal of Therapy:  Treat infection  Plan:   Zosyn 3.375 Gm IV q8h EI  F/u SCr/cultures as needed  Dorrene German 04/08/2015,4:23 AM

## 2015-04-08 NOTE — Consult Note (Signed)
Name: Heidi Dominguez MRN: [id number] DOB: 02-16-1921    ADMISSION DATE:  04/07/2015 CONSULTATION DATE:  04/08/15  REFERRING MD :  Dr. Algis Liming   CHIEF COMPLAINT:  Pre-Operative Pulmonary Clearance for Gallstone Pancreatitis    BRIEF PATIENT DESCRIPTION: 81 y/o F admitted 8/24 with abdominal pain, nausea / vomiting.  Found to have gallstone pancreatitis and PCCM consulted for pre-operative pulmonary clearance.   SIGNIFICANT EVENTS  8/24  Admit with abdominal pain.    STUDIES:  02/24/12  ECHO >> LVEF 78-93%, grade 1 diastolic dysfunction, mod MR 8/17  CXR >> cardiomegaly with normal pulmonary vascularity, no infiltrates   HISTORY OF PRESENT ILLNESS:  81 y/o F with PMH of DM, HTN, HLD, DJD/Osteoarthritis, HOH, Chronic dCHF, GERD, shoulder replacements, back surgery and a history of difficult time waking from anesthesia who presented to [facility] on 8/24 with complaints of abdominal pain, nausea & vomiting.    The patient reports her pain began as epigastric and then localized to the RUQ.  She had associated nausea and vomiting.  Initial labs:  Na 141, K 3.7, sr cr 0.75, glucose 130, albumin 3.1, lipase 1645, AST 158/ALT 154, total protein 6.1, WBC 15.6, hgb 14.2, and platelets of 161.  Cholesterol 147, trig's 52.  Ultrasound showed gallstones with CBD dilatation and an 8 mm gallstone in the neck of the gallbladder.  The patient was admitted per [facility] for further evaluation of gallstone pancreatitis.  She was treated with IV fluids and pain control.  CCS evaluated the patient and are considering a cholecystectomy once pancreatitis resolves.    She was seen in the Pulmonary office on 8/17 for non-productive cough, wheezing and SOB.  At that time, she was taken off her ACE-I and changed to an ARB.  Plan was to follow up in 4 weeks to assess cough with consideration for a Beta-1 selective BB.  The patient reports she has had exertional dyspnea for approximately 9 years.  She lives alone with an aide and  has not driven in 9 years. She does aerobics at the [facility] 3 days a week and does laps in her home on her 'off days'.  She is unable to navigate stairs and has a ramp at home.    Currently, the patient denies SOB, chest pain, pain with inspiration, n/v/d, and headaches.  She reports mild abdominal pain with deep inspiration.     PULMONARY HX: Smoking: never smoker, did not live with a smoker Home:  Prior wood burning stove  Occupation: Retired Pharmacist, hospital  Drug Abuse: none    PAST MEDICAL HISTORY :   has a past medical history of Diabetes mellitus; Hypertension; High cholesterol; Osteoarthritis; Degenerative joint disease of shoulder region; HOH (hard of hearing); Complication of anesthesia; Chronic diastolic CHF (congestive heart failure); Pneumonia; and GERD (gastroesophageal reflux disease).  has past surgical history that includes Shoulder surgery; Knee surgery; Back surgery; Abdominal hysterectomy; and cataracts.   Prior to Admission medications   Medication Sig Start Date End Date Taking? Authorizing Provider  acetaminophen (TYLENOL) 325 MG tablet Take 2 tablets (650 mg total) by mouth every 6 (six) hours as needed (or Fever >/= 101). 08/12/12  Yes Nita Sells, MD  amLODipine (NORVASC) 5 MG tablet Take 1 tablet (5 mg total) by mouth daily. 03/31/15  Yes Tanda Rockers, MD  aspirin 81 MG tablet Take 81 mg by mouth daily.   Yes Historical Provider, MD  benzonatate (TESSALON) 100 MG capsule Take by mouth 3 (three) times daily as needed  for cough.   Yes Historical Provider, MD  Cholecalciferol (VITAMIN D3) 5000 UNITS CAPS Take 1 capsule by mouth daily.   Yes Historical Provider, MD  Ferrous Gluconate (IRON) 240 (27 FE) MG TABS Take 1 tablet by mouth daily.   Yes Historical Provider, MD  furosemide (LASIX) 20 MG tablet 1 TABLET (20 MG) BY MOUTH DAILY 03/22/15  Yes Historical Provider, MD  JANUVIA 50 MG tablet 1 TABLET (50 MG) BY MOUTH DAILY 03/11/15  Yes Historical Provider, MD  labetalol  (NORMODYNE) 200 MG tablet Take 200 mg by mouth 2 (two) times daily.   Yes Historical Provider, MD  levalbuterol (XOPENEX) 1.25 MG/3ML nebulizer solution Take 1.25 mg by nebulization every 4 (four) hours as needed for wheezing.   Yes Historical Provider, MD  Magnesium 250 MG TABS Take 250 mg by mouth daily.    Yes Historical Provider, MD  mirabegron ER (MYRBETRIQ) 50 MG TB24 tablet Take 50 mg by mouth daily.   Yes Historical Provider, MD  potassium chloride (K-DUR,KLOR-CON) 10 MEQ tablet Take 10 mEq by mouth daily.  03/22/15  Yes Historical Provider, MD  pravastatin (PRAVACHOL) 40 MG tablet Take 40 mg by mouth daily.   Yes Historical Provider, MD  ranitidine (ZANTAC) 150 MG tablet Take 1 tablet (150 mg total) by mouth 2 (two) times daily. 02/29/12 04/07/16 Yes Dixie Dials, MD  valsartan (DIOVAN) 160 MG tablet Take 1 tablet (160 mg total) by mouth daily. 03/31/15  Yes Tanda Rockers, MD  VENTOLIN HFA 108 (90 BASE) MCG/ACT inhaler INHALE 2 PUFFS Q 4 H PRN 02/22/15  Yes Historical Provider, MD  vitamin B-12 (CYANOCOBALAMIN) 500 MCG tablet Take 500 mcg by mouth daily.   Yes Historical Provider, MD  cefUROXime (CEFTIN) 250 MG tablet Take 1 tablet (250 mg total) by mouth 2 (two) times daily with a meal. Patient not taking: Reported on 03/31/2015 08/12/12   Nita Sells, MD   Allergies  Allergen Reactions  . Catapres [Clonidine Hcl] Other (See Comments)    Unknown.  Lorrin Goodell [Verapamil Hcl] Other (See Comments)    Unknown.  . Other Other (See Comments)    Any type of narcotics Feels "loopy"  . Sulfa Antibiotics Itching    FAMILY HISTORY:  family history includes Diabetes Mellitus I in her mother; Hypertension in her father and mother.   SOCIAL HISTORY:  reports that she has never smoked. She has never used smokeless tobacco. She reports that she does not drink alcohol or use illicit drugs.  REVIEW OF SYSTEMS:   Constitutional: Negative for fever, chills, weight loss, malaise/fatigue and  diaphoresis.  HENT: Negative for hearing loss, ear pain, nosebleeds, congestion, sore throat, neck pain, tinnitus and ear discharge.   Eyes: Negative for blurred vision, double vision, photophobia, pain, discharge and redness.  Respiratory: Negative for cough, hemoptysis, sputum production, shortness of breath, wheezing and stridor.   Cardiovascular: Negative for chest pain, palpitations, orthopnea, claudication, leg swelling and PND.  Gastrointestinal: Negative for heartburn, diarrhea, constipation, blood in stool and melena. Reports prior nausea, vomiting, abdominal pain Genitourinary: Negative for dysuria, urgency, frequency, hematuria and flank pain.  Musculoskeletal: Negative for myalgias, back pain, joint pain and falls.  Skin: Negative for itching and rash.  Neurological: Negative for dizziness, tingling, tremors, sensory change, speech change, focal weakness, seizures, loss of consciousness, weakness and headaches.  Endo/Heme/Allergies: Negative for environmental allergies and polydipsia. Does not bruise/bleed easily.  SUBJECTIVE:   VITAL SIGNS: Temp:  [98.5 F (36.9 C)-99 F (37.2 C)] 98.9 F (  37.2 C) (08/25 1506) Pulse Rate:  [76-83] 76 (08/25 1506) Resp:  [16-19] 18 (08/25 1506) BP: (148-165)/(54-70) 165/60 mmHg (08/25 1506) SpO2:  [91 %-99 %] 91 % (08/25 1506) Weight:  [198 lb 6.6 oz (90 kg)] 198 lb 6.6 oz (90 kg) (08/25 0455)  PHYSICAL EXAMINATION: General:  Frail elderly female in NAD, appears younger than numerical age Neuro:  AAOx4, speech clear, MAE HEENT:  Mm pink/moist, no jvd Cardiovascular:  s1s2 rrr, 2-3/6 SEM Lungs:  Even/non-labored, lungs bilaterally clear, no wheezing/rhonchi Abdomen:  Obese/soft, bsx4 active, tender to palpation diffusely Musculoskeletal:  No acute deformities  Skin:  Warm/dry, 1+ ankle edema    Recent Labs Lab 04/07/15 2225 04/08/15 0550  NA 138 141  K 3.8 3.7  CL 100* 107  CO2 30 29  BUN 19 16  CREATININE 0.87 0.75  GLUCOSE  196* 130*    Recent Labs Lab 04/07/15 2225 04/08/15 0550  HGB 14.2 12.4  HCT 44.5 39.1  WBC 15.6* 11.2*  PLT 161 142*   US Abdomen Limited Ruq  04/08/2015   CLINICAL DATA:  81 year old female with epigastric and right upper quadrant abdominal pain and elevated LFTs. Question gallstones.  EXAM: US ABDOMEN LIMITED - RIGHT UPPER QUADRANT  COMPARISON:  CT 08/07/2012  FINDINGS: Gallbladder:  Distended with 8 mm stone in the neck. No gallbladder wall thickening, wall thickness of 3 mm. No sonographic Murphy sign noted.  Common bile duct:  Diameter: 13.8 mm, distended. The more distal common bile duct is obscured.  Liver:  No focal lesion identified. Within normal limits in parenchymal echogenicity.  IMPRESSION: 1. Distended gallbladder with cholelithiasis, 8 mm stone in the neck. No secondary findings of cholecystitis. 2. Distended common bile duct, up to 13.8 mm. However, this appears unchanged on comparison to prior CT of 2013.   Electronically Signed   By: Jeb Levering M.D.   On: 04/08/2015 00:28    ASSESSMENT / PLAN:  Pre-Operative Pulmonary Clearance for Gallstone Pancreatitis Hx of Wheezing / Dyspnea - this has been ongoing for over a year.  She reports wheezing is in exertion only.  ? If this is upper airway wheeze vs CHF / volume wheeze.    She is a remarkably well preserved 81 year old but would consider her high risk for anesthesia/surgery due to advanced age, multiple chronic medical conditions (HTN, DM, dCHF, GERD), and a history of difficulty waking from anesthesia - had surgery ~ 20 years ago and had a difficult time clearing sedation.    Major Pulmonary risks identified in the multifactorial risk analysis are but not limited to a) pneumonia; b) recurrent intubation risk; c) prolonged or recurrent acute respiratory failure needing mechanical ventilation; d) prolonged hospitalization; e) DVT/Pulmonary embolism; f) Acute Pulmonary edema  Recommend: 1.  Short duration of surgery as  much as possible and avoid paralytic if possible 2.  Recovery in step down or ICU with Pulmonary consultation 3.  DVT prophylaxis 4.  Aggressive pulmonary hygiene:  incentive spirometry and early ambulation 5.  Agree with repeat ECHO assessment / Cardiac assessment 6.  Assess CXR pre-operative    Noe Gens, NP-C [facility] Pgr: [phone] or if no answer [phone] 04/08/2015, 3:27 PM

## 2015-04-08 NOTE — Progress Notes (Addendum)
PROGRESS NOTE    Heidi Dominguez AUQ:[id number] DOB: 14-Jun-1921 DOA: 04/07/2015 PCP: Mathews Argyle, MD  HPI/Brief narrative 81 year old female patient with history of DM 2, HTN, chronic diastolic CHF, HLD, GERD, chronic wheezing/dyspnea (recently saw pulmonology), presented to the hospital with abdominal pain and diagnosed with acute gallstone pancreatitis. Surgery have consulted and are considering cholecystectomy once pancreatitis resolves and have requested cardiology and pulmonary consultation for preop risk assessment.   Assessment/Plan:  Acute gallstone pancreatitis - Treating supportively with bowel rest, IV fluids and pain management. - Clinically improved. Lipase has decreased from 2374 > 1645. Improved transaminitis. - Informed patient's primary GI (Dr. Teena Irani) and PCP of patient's admission.  Symptomatic cholelithiasis with possible cholecystitis - Ultrasound abdomen confirms distended gallbladder with gallstones and a 8 mm stone at the neck of the gallbladder. No secondary findings of cholecystitis. CBD dilated up to 13.8 mm but stable compared to CT 2013. - Surgical consultation appreciated and are considering cholecystectomy pending improvement of pancreatitis. Surgeons have requested cardiology and pulmonology consultation for preoperative risk evaluation - Continue IV Zosyn  Essential hypertension - Mildly uncontrolled. Currently on scheduled IV metoprolol and when necessary IV hydralazine.  Type II DM, controlled - On NovoLog SSI. - As per PCP, well controlled based on recent A1c in May in the low 6 range.  HLD - Lipid panel unremarkable  Chronic diastolic CHF - Compensated  Dyspnea/wheezing - Requested pulmonary consultation for assessment, management and preop risk evaluation. - As per outpatient pulmonary evaluation, switch from ACEI to ARB (done with mild improvement) with next step being switching to selective beta blocker  (Bystolic/Bisoprolol) - Currently on IV metoprolol-continue pending pulmonary consultation.   DVT prophylaxis: SCDs Code Status: Full Family Communication: Discussed with daughter Ms. Havery Moros on 04/08/15. Disposition Plan: DC home when medically stable   Consultants:  General surgery  Cardiology  Pulmonology  Procedures:  None  Antibiotics:  IV Zosyn   Subjective: Feels better compared to admission. Mild intermittent epigastric/RUQ pain. No chest pain. States that she lives alone, ambulates with the help of a walker and has intermittent wheezing and dyspnea with activity but no chest pain. Has an aide who helps her out during the daytime and sometimes at night.   Objective: Filed Vitals:   04/08/15 0319 04/08/15 0455 04/08/15 1020 04/08/15 1506  BP: 149/68   165/60  Pulse: 81   76  Temp: 98.5 F (36.9 C)   98.9 F (37.2 C)  TempSrc: Oral   Oral  Resp: 19   18  Height:   5\' 6"  (1.676 m)   Weight:  90 kg (198 lb 6.6 oz)    SpO2: 99%   91%    Intake/Output Summary (Last 24 hours) at 04/08/15 1517 Last data filed at 04/08/15 0847  Gross per 24 hour  Intake      0 ml  Output      0 ml  Net      0 ml   Filed Weights   04/08/15 0455  Weight: 90 kg (198 lb 6.6 oz)     Exam:  General exam: pleasant elderly female lying comfortably propped up in bed. Respiratory system: Clear. No increased work of breathing. Cardiovascular system: S1 & S2 heard, RRR. No JVD, murmurs, gallops, clicks or pedal edema. Gastrointestinal system: Abdomen is nondistended, soft. Mild RUQ tenderness without rigidity, guarding or rebound. Normal bowel sounds heard. Central nervous system: Alert and oriented. No focal neurological deficits. Extremities: Symmetric 5 x 5 power.  Data Reviewed: Basic Metabolic Panel:  Recent Labs Lab 04/07/15 2225 2015-04-24 0550  NA 138 141  K 3.8 3.7  CL 100* 107  CO2 30 29  GLUCOSE 196* 130*  BUN 19 16  CREATININE 0.87 0.75  CALCIUM  9.7 9.0   Liver Function Tests:  Recent Labs Lab 04/07/15 2225 04-24-15 0550  AST 375* 158*  ALT 216* 154*  ALKPHOS 130* 95  BILITOT 1.4* 0.5  PROT 7.4 6.1*  ALBUMIN 3.8 3.1*    Recent Labs Lab 04/07/15 2225 2015-04-24 0550  LIPASE 2374* 1645*   No results for input(s): AMMONIA in the last 168 hours. CBC:  Recent Labs Lab 04/07/15 2225 04/24/15 0550  WBC 15.6* 11.2*  HGB 14.2 12.4  HCT 44.5 39.1  MCV 94.9 95.1  PLT 161 142*   Cardiac Enzymes: No results for input(s): CKTOTAL, CKMB, CKMBINDEX, TROPONINI in the last 168 hours. BNP (last 3 results) No results for input(s): PROBNP in the last 8760 hours. CBG:  Recent Labs Lab 04-24-2015 0424 04-24-15 1202  GLUCAP 138* 119*    No results found for this or any previous visit (from the past 240 hour(s)).         Studies: US Abdomen Limited Ruq  04/24/2015   CLINICAL DATA:  81 year old female with epigastric and right upper quadrant abdominal pain and elevated LFTs. Question gallstones.  EXAM: US ABDOMEN LIMITED - RIGHT UPPER QUADRANT  COMPARISON:  CT 08/07/2012  FINDINGS: Gallbladder:  Distended with 8 mm stone in the neck. No gallbladder wall thickening, wall thickness of 3 mm. No sonographic Murphy sign noted.  Common bile duct:  Diameter: 13.8 mm, distended. The more distal common bile duct is obscured.  Liver:  No focal lesion identified. Within normal limits in parenchymal echogenicity.  IMPRESSION: 1. Distended gallbladder with cholelithiasis, 8 mm stone in the neck. No secondary findings of cholecystitis. 2. Distended common bile duct, up to 13.8 mm. However, this appears unchanged on comparison to prior CT of 2013.   Electronically Signed   By: Jeb Levering M.D.   On: Apr 24, 2015 00:28        Scheduled Meds: . insulin aspart  0-9 Units Subcutaneous Q4H  . metoprolol  5 mg Intravenous 4 times per day  . piperacillin-tazobactam (ZOSYN)  IV  3.375 g Intravenous Q8H   Continuous Infusions: . sodium  chloride 50 mL/hr at April 24, 2015 0413    Principal Problem:   Acute gallstone pancreatitis Active Problems:   Essential hypertension   Dyslipidemia   Diabetes mellitus type 2, controlled   Gallstone pancreatitis   Mitral regurgitation   Chronic diastolic CHF (congestive heart failure)    Time spent: 30 minutes.    Vernell Leep, MD, FACP, FHM. [facility] Pager [phone]  If 7PM-7AM, please contact night-coverage [email or web address] Password [email or web address] 04/24/2015, 3:17 PM    LOS: 0 days

## 2015-04-09 ENCOUNTER — Inpatient Hospital Stay (HOSPITAL_COMMUNITY): Payer: Medicare Other

## 2015-04-09 DIAGNOSIS — Z01810 Encounter for preprocedural cardiovascular examination: Secondary | ICD-10-CM | POA: Insufficient documentation

## 2015-04-09 DIAGNOSIS — R0600 Dyspnea, unspecified: Secondary | ICD-10-CM

## 2015-04-09 LAB — CBC
HEMATOCRIT: 40 % (ref 36.0–46.0)
HEMOGLOBIN: 12.5 g/dL (ref 12.0–15.0)
MCH: 29.8 pg (ref 26.0–34.0)
MCHC: 31.3 g/dL (ref 30.0–36.0)
MCV: 95.5 fL (ref 78.0–100.0)
Platelets: 139 10*3/uL — ABNORMAL LOW (ref 150–400)
RBC: 4.19 MIL/uL (ref 3.87–5.11)
RDW: 14.3 % (ref 11.5–15.5)
WBC: 8 10*3/uL (ref 4.0–10.5)

## 2015-04-09 LAB — COMPREHENSIVE METABOLIC PANEL
ALBUMIN: 3 g/dL — AB (ref 3.5–5.0)
ALT: 98 U/L — ABNORMAL HIGH (ref 14–54)
AST: 64 U/L — AB (ref 15–41)
Alkaline Phosphatase: 81 U/L (ref 38–126)
Anion gap: 9 (ref 5–15)
BUN: 13 mg/dL (ref 6–20)
CHLORIDE: 105 mmol/L (ref 101–111)
CO2: 27 mmol/L (ref 22–32)
Calcium: 9.1 mg/dL (ref 8.9–10.3)
Creatinine, Ser: 0.81 mg/dL (ref 0.44–1.00)
GFR calc Af Amer: >60 mL/min (ref >60)
GFR calc non Af Amer: >60 mL/min (ref >60)
GLUCOSE: 106 mg/dL — AB (ref 65–99)
POTASSIUM: 3.3 mmol/L — AB (ref 3.5–5.1)
SODIUM: 141 mmol/L (ref 135–145)
Total Bilirubin: 0.6 mg/dL (ref 0.3–1.2)
Total Protein: 6.2 g/dL — ABNORMAL LOW (ref 6.5–8.1)

## 2015-04-09 LAB — GLUCOSE, CAPILLARY
GLUCOSE-CAPILLARY: 102 mg/dL — AB (ref 65–99)
GLUCOSE-CAPILLARY: 81 mg/dL (ref 65–99)
Glucose-Capillary: 83 mg/dL (ref 65–99)
Glucose-Capillary: 104 mg/dL — ABNORMAL HIGH (ref 65–99)
Glucose-Capillary: 89 mg/dL (ref 65–99)

## 2015-04-09 LAB — LIPASE, BLOOD: Lipase: 653 U/L — ABNORMAL HIGH (ref 22–51)

## 2015-04-09 MED ORDER — POTASSIUM CHLORIDE CRYS ER 20 MEQ PO TBCR
40.0000 meq | EXTENDED_RELEASE_TABLET | Freq: Once | ORAL | Status: AC
  Administered 2015-04-09: 40 meq via ORAL
  Filled 2015-04-09: qty 2

## 2015-04-09 MED ORDER — BISOPROLOL FUMARATE 5 MG PO TABS
5.0000 mg | ORAL_TABLET | Freq: Every day | ORAL | Status: DC
  Administered 2015-04-09 – 2015-04-12 (×4): 5 mg via ORAL
  Filled 2015-04-09 (×5): qty 1

## 2015-04-09 MED ORDER — POTASSIUM CHLORIDE 10 MEQ/100ML IV SOLN
10.0000 meq | INTRAVENOUS | Status: DC
  Administered 2015-04-09: 10 meq via INTRAVENOUS
  Filled 2015-04-09 (×4): qty 100

## 2015-04-09 MED ORDER — DEXTROSE-NACL 5-0.45 % IV SOLN
INTRAVENOUS | Status: DC
  Administered 2015-04-09 (×2): via INTRAVENOUS

## 2015-04-09 NOTE — Care Management Note (Signed)
Case Management Note  Patient Details  Name: Heidi Dominguez MRN: [id number] Date of Birth: Jan 05, 1921  Subjective/Objective:                 Acute pancreatitis   Action/Plan:Date:  April 09, 2015 U.R. performed for needs and level of care. Will continue to follow for Case Management needs.  Velva Harman, RN, BSN, [place]   [phone]  Expected Discharge Date:                  Expected Discharge Plan:  Home/Self Care  In-House Referral:  NA  Discharge planning Services  CM Consult  Post Acute Care Choice:  NA Choice offered to:  NA  DME Arranged:    DME Agency:     HH Arranged:    [facility] Agency:     Status of Service:  Completed, signed off  Medicare Important Message Given:    Date Medicare IM Given:    Medicare IM give by:    Date Additional Medicare IM Given:    Additional Medicare Important Message give by:     If discussed at [place] of Stay Meetings, dates discussed:    Additional Comments:  Leeroy Cha, RN 04/09/2015, 11:33 AM

## 2015-04-09 NOTE — Progress Notes (Signed)
Patient: Heidi Dominguez / Admit Date: 04/07/2015 / Date of Encounter: 04/09/2015, 9:07 AM   Subjective: Feeling pretty good. Queasiness improved. No LEE.   Objective: Telemetry: n/a Physical Exam: Blood pressure 145/73, pulse 87, temperature 98 F (36.7 C), temperature source Oral, resp. rate 18, height 5\' 6"  (1.676 m), weight 198 lb 6.6 oz (90 kg), SpO2 94 %. General: Well developed, well nourished AAF in no acute distress. Head: Normocephalic, atraumatic, sclera non-icteric, no xanthomas, nares are without discharge. Neck: JVP not elevated. Lungs: Clear bilaterally to auscultation without wheezes, rales, or rhonchi. Breathing is unlabored. Heart: RRR S1 S2 without murmurs, rubs, or gallops.  Abdomen: Soft, non-tender, non-distended. No rebound/guarding. Extremities: No clubbing or cyanosis. No edema. Distal pedal pulses are 2+ and equal bilaterally. Neuro: Alert and oriented X 3. Moves all extremities spontaneously. Psych:  Responds to questions appropriately with a normal affect.   Intake/Output Summary (Last 24 hours) at 04/09/15 0907 Last data filed at 04/08/15 1721  Gross per 24 hour  Intake    360 ml  Output      0 ml  Net    360 ml    Inpatient Medications:  . insulin aspart  0-9 Units Subcutaneous Q4H  . metoprolol  5 mg Intravenous 4 times per day  . piperacillin-tazobactam (ZOSYN)  IV  3.375 g Intravenous Q8H  . potassium chloride  40 mEq Oral Once   Infusions:  . dextrose 5 % and 0.45% NaCl 75 mL/hr at 04/09/15 0011    Labs:  Recent Labs  04/08/15 0550 04/09/15 0520  NA 141 141  K 3.7 3.3*  CL 107 105  CO2 29 27  GLUCOSE 130* 106*  BUN 16 13  CREATININE 0.75 0.81  CALCIUM 9.0 9.1    Recent Labs  04/08/15 0550 04/09/15 0520  AST 158* 64*  ALT 154* 98*  ALKPHOS 95 81  BILITOT 0.5 0.6  PROT 6.1* 6.2*  ALBUMIN 3.1* 3.0*    Recent Labs  04/08/15 0550 04/09/15 0520  WBC 11.2* 8.0  HGB 12.4 12.5  HCT 39.1 40.0  MCV 95.1 95.5  PLT 142*  139*   No results for input(s): CKTOTAL, CKMB, TROPONINI in the last 72 hours. Invalid input(s): POCBNP No results for input(s): HGBA1C in the last 72 hours.   Radiology/Studies:  Dg Chest 2 View  04/08/2015   CLINICAL DATA:  Mid chest pain today, history of CHF, pneumonia, and diabetes  EXAM: CHEST  2 VIEW  COMPARISON:  Chest x-ray of March 31, 2015  FINDINGS: The lungs are adequately inflated and clear. There is no interstitial or alveolar edema. The heart is mildly enlarged. The central pulmonary vascularity is engorged. There is mild tortuosity of the descending thoracic aorta. There is no significant pleural effusion. There prosthetic shoulder joints bilaterally. The observed thoracic vertebral bodies are preserved in height.  IMPRESSION: Mild prominence of the cardiac silhouette and central pulmonary vascularity extension weighted by the AP projection. There may be low-grade compensated CHF however. There is no evidence of pneumonia.   Electronically Signed   By: David  Martinique M.D.   On: 04/08/2015 16:57   Dg Chest 2 View  03/31/2015   CLINICAL DATA:  Cough and wheezing .  EXAM: CHEST  2 VIEW  COMPARISON:  03/13/2014.  FINDINGS: Mediastinum and hilar structures normal. Cardiomegaly with normal pulmonary vascularity. No focal pulmonary infiltrate. No pleural effusion or pneumothorax. Bilateral shoulder replacements.  IMPRESSION: 1. Cardiomegaly.  2.  No focal pulmonary infiltrates.  Electronically Signed   By: Marcello Moores  Register   On: 03/31/2015 14:23   US Abdomen Limited Ruq  04/08/2015   CLINICAL DATA:  81 year old female with epigastric and right upper quadrant abdominal pain and elevated LFTs. Question gallstones.  EXAM: US ABDOMEN LIMITED - RIGHT UPPER QUADRANT  COMPARISON:  CT 08/07/2012  FINDINGS: Gallbladder:  Distended with 8 mm stone in the neck. No gallbladder wall thickening, wall thickness of 3 mm. No sonographic Murphy sign noted.  Common bile duct:  Diameter: 13.8 mm, distended. The  more distal common bile duct is obscured.  Liver:  No focal lesion identified. Within normal limits in parenchymal echogenicity.  IMPRESSION: 1. Distended gallbladder with cholelithiasis, 8 mm stone in the neck. No secondary findings of cholecystitis. 2. Distended common bile duct, up to 13.8 mm. However, this appears unchanged on comparison to prior CT of 2013.   Electronically Signed   By: Jeb Levering M.D.   On: 04/08/2015 00:28   TTE: 04/09/15 - Left ventricle: The cavity size was normal. There was mild concentric hypertrophy. Systolic function was normal. The estimated ejection fraction was in the range of 60% to 65%. Wall motion was normal; there were no regional wall motion abnormalities. There was an increased relative contribution of atrial contraction to ventricular filling. Doppler parameters are consistent with abnormal left ventricular relaxation (grade 1 diastolic dysfunction). Doppler parameters are consistent with high ventricular filling pressure. - Aortic valve: Moderately calcified annulus. Trileaflet. Thickening and calcification. There was mild stenosis. Valve area (VTI): 1.33 cm^2. Valve area (Vmax): 1.45 cm^2. Valve area (Vmean): 1.25 cm^2. - Mitral valve: Calcified annulus. There was mild regurgitation. - Left atrium: The atrium was severely dilated. - Pulmonary arteries: PA peak pressure: 32 mm Hg (S).    Assessment and Plan   1. Pre-operative evaluation for gallstone pancreatitis - regardless of cardiac status, given her age, she will be high risk for surgery, but will discuss pre-operative eval with MD. Note negative nuc in 2013, no history of anginal-type chest pain. She has had DOE/wheezing x 1 yr and mitral regurgitation, thus we are awaiting updated echo. If her LVEF remains the same and she doesn't have severe MR, we won't proceed with further evaluation. She is being "cooled off" so no actual surgery date is set yet. Would resume  aspirin when OK with IM/surgery.  2. Dyspnea/wheezing on exertion for 1 year - in process of outpatient evaluation by pulm. Mild improvement in symptoms with switch from [place]. Pulmonologist planned next step as switching BB to more selective agent which is reasonable. Dr. Algis Liming reported to me the patient is OK to take orals per surgery but the son said he's not sure if that was the case when surgery came by this morning. Will defer to IM in this case for final determination - would likely plan to start bisoprolol in place of home labetalol (5mg  daily). If BPs remain elevated then would resume home amlodipine next. Hold off resuming Lasix given that she is not totally taking orals yet.  3. Chronic diastolic CHF - appears compensated. Follow daily weights and strict I/Os. See above re: BP.  4. Moderate mitral regurgitation - update echo as above. Results are pending.   Signed, Melina Copa PA-C Pager: [phone]  The patient was seen, examined and discussed with Melina Copa, PA-C and I agree with the above.   The patient was seen, examined and discussed with Melina Copa, PA-C and I agree with the above.   81 year old female admitted  with gallstone pancreatitis, with planned gallbladder removal. She has h/o chronic diastolic CHF followed by Dr Tamala Julian. She needs a preop evaluation - normal LVEF, grade 1 diastolic dysfunction in 2094 and normal stress test. She denies chest pain, but had some worsening SOB in the last year, she can certainly achieve 4 METS.  She appears euvolemic, today's echocardiogram shows normal LVEF 60-65% with no regional wall motion abnormalities. She has mild MR, mild AS, mild TR, but no severe valvular abnormality. Normal pulmonary pressures. Considering her age, she is an intermediate risk for a planned surgery, but there is currently no contraindication from cardiac standpoint to undergo her surgery.   Dorothy Spark 04/09/2015

## 2015-04-09 NOTE — Progress Notes (Signed)
Echocardiogram 2D Echocardiogram has been performed.  Heidi Dominguez 04/09/2015, 9:49 AM

## 2015-04-09 NOTE — Progress Notes (Signed)
PROGRESS NOTE    LANESSA SHILL OEU:[id number] DOB: 21-Jan-1921 DOA: 04/07/2015 PCP: Mathews Argyle, MD  HPI/Brief narrative 81 year old female patient with history of DM 2, HTN, chronic diastolic CHF, HLD, GERD, chronic wheezing/dyspnea (recently saw pulmonology), presented to the hospital with abdominal pain and diagnosed with acute gallstone pancreatitis. Surgery have consulted and are considering cholecystectomy once pancreatitis resolves and have requested cardiology and pulmonary consultation for preop risk assessment.   Assessment/Plan:  Acute gallstone pancreatitis - Treating supportively with bowel rest, IV fluids and pain management. - Clinically improved. Lipase has decreased from 2374 > 1645 >653. Improved transaminitis.  Symptomatic cholelithiasis with possible cholecystitis - Ultrasound abdomen confirms distended gallbladder with gallstones and a 8 mm stone at the neck of the gallbladder. No secondary findings of cholecystitis. CBD dilated up to 13.8 mm but stable compared to CT 2013. - Surgical consultation appreciated and are considering cholecystectomy pending improvement of pancreatitis. Surgeons have requested cardiology and pulmonology consultation for preoperative risk evaluation - Continue IV Zosyn - As per cardiology, intermediate risk for perioperative cardiac complications. As per pulmonary, moderate risk for perioperative pulmonary complications. No restrictions to surgery. - Discussed with surgery 8/26 and okay to have meds with sips  Essential hypertension - Mildly uncontrolled. Currently on scheduled IV metoprolol (we'll change to oral bisoprolol) and when necessary IV hydralazine.  Type II DM, controlled - On NovoLog SSI. - As per PCP, well controlled based on recent A1c in May in the low 6 range.  HLD - Lipid panel unremarkable  Chronic diastolic CHF - Compensated  Dyspnea/wheezing - Requested pulmonary consultation for assessment,  management and preop risk evaluation. - As per outpatient pulmonary evaluation, switch from ACEI to ARB (done with mild improvement) with next step being switching to selective beta blocker (Bystolic/Bisoprolol) - Currently on IV metoprolol-changed to oral bisoprolol  Hypokalemia - Replace and follow  Thrombocytopenia - Follow CBCs. ? Related to acute illness/infection or Abx.   DVT prophylaxis: SCDs Code Status: Full Family Communication: Discussed with son at bedside on 8/26. Disposition Plan: DC home when medically stable   Consultants:  General surgery  Cardiology  Pulmonology  Procedures:  None  Antibiotics:  IV Zosyn   Subjective: Abdominal pain only on palpation. Flatus + +. No BM. No nausea or vomiting. No chest pain or dyspnea.  Objective: Filed Vitals:   04/08/15 2000 04/09/15 0500 04/09/15 1300 04/09/15 1443  BP: 159/57 145/73 182/63 157/56  Pulse: 72 87 67   Temp: 98.6 F (37 C) 98 F (36.7 C) 98.5 F (36.9 C)   TempSrc: Oral Oral Oral   Resp: 18 18 20    Height:      Weight:  90 kg (198 lb 6.6 oz)    SpO2: 92% 94% 99%     Intake/Output Summary (Last 24 hours) at 04/09/15 1555 Last data filed at 04/09/15 0700  Gross per 24 hour  Intake 871.25 ml  Output      0 ml  Net 871.25 ml   Filed Weights   04/08/15 0455 04/09/15 0500  Weight: 90 kg (198 lb 6.6 oz) 90 kg (198 lb 6.6 oz)     Exam:  General exam: pleasant elderly female lying comfortably propped up in bed. Respiratory system: Clear. No increased work of breathing. Cardiovascular system: S1 & S2 heard, RRR. No JVD, murmurs, gallops, clicks or pedal edema. Gastrointestinal system: Abdomen is nondistended, soft. Mild RUQ & epigastric tenderness without rigidity, guarding or rebound. Normal bowel sounds heard. Central nervous system:  Alert and oriented. No focal neurological deficits. Extremities: Symmetric 5 x 5 power.   Data Reviewed: Basic Metabolic Panel:  Recent Labs Lab  04/07/15 2225 04/08/15 0550 04/09/15 0520  NA 138 141 141  K 3.8 3.7 3.3*  CL 100* 107 105  CO2 30 29 27   GLUCOSE 196* 130* 106*  BUN 19 16 13   CREATININE 0.87 0.75 0.81  CALCIUM 9.7 9.0 9.1   Liver Function Tests:  Recent Labs Lab 04/07/15 2225 04/08/15 0550 04/09/15 0520  AST 375* 158* 64*  ALT 216* 154* 98*  ALKPHOS 130* 95 81  BILITOT 1.4* 0.5 0.6  PROT 7.4 6.1* 6.2*  ALBUMIN 3.8 3.1* 3.0*    Recent Labs Lab 04/07/15 2225 04/08/15 0550 04/09/15 0520  LIPASE 2374* 1645* 653*   No results for input(s): AMMONIA in the last 168 hours. CBC:  Recent Labs Lab 04/07/15 2225 04/08/15 0550 04/09/15 0520  WBC 15.6* 11.2* 8.0  HGB 14.2 12.4 12.5  HCT 44.5 39.1 40.0  MCV 94.9 95.1 95.5  PLT 161 142* 139*   Cardiac Enzymes: No results for input(s): CKTOTAL, CKMB, CKMBINDEX, TROPONINI in the last 168 hours. BNP (last 3 results) No results for input(s): PROBNP in the last 8760 hours. CBG:  Recent Labs Lab 04/08/15 2008 04/08/15 2355 04/09/15 0410 04/09/15 0738 04/09/15 1126  GLUCAP 93 75 102* 83 104*    No results found for this or any previous visit (from the past 240 hour(s)).         Studies: Dg Chest 2 View  04/08/2015   CLINICAL DATA:  Mid chest pain today, history of CHF, pneumonia, and diabetes  EXAM: CHEST  2 VIEW  COMPARISON:  Chest x-ray of March 31, 2015  FINDINGS: The lungs are adequately inflated and clear. There is no interstitial or alveolar edema. The heart is mildly enlarged. The central pulmonary vascularity is engorged. There is mild tortuosity of the descending thoracic aorta. There is no significant pleural effusion. There prosthetic shoulder joints bilaterally. The observed thoracic vertebral bodies are preserved in height.  IMPRESSION: Mild prominence of the cardiac silhouette and central pulmonary vascularity extension weighted by the AP projection. There may be low-grade compensated CHF however. There is no evidence of  pneumonia.   Electronically Signed   By: David  Martinique M.D.   On: 04/08/2015 16:57   US Abdomen Limited Ruq  04/08/2015   CLINICAL DATA:  81 year old female with epigastric and right upper quadrant abdominal pain and elevated LFTs. Question gallstones.  EXAM: US ABDOMEN LIMITED - RIGHT UPPER QUADRANT  COMPARISON:  CT 08/07/2012  FINDINGS: Gallbladder:  Distended with 8 mm stone in the neck. No gallbladder wall thickening, wall thickness of 3 mm. No sonographic Murphy sign noted.  Common bile duct:  Diameter: 13.8 mm, distended. The more distal common bile duct is obscured.  Liver:  No focal lesion identified. Within normal limits in parenchymal echogenicity.  IMPRESSION: 1. Distended gallbladder with cholelithiasis, 8 mm stone in the neck. No secondary findings of cholecystitis. 2. Distended common bile duct, up to 13.8 mm. However, this appears unchanged on comparison to prior CT of 2013.   Electronically Signed   By: Jeb Levering M.D.   On: 04/08/2015 00:28        Scheduled Meds: . insulin aspart  0-9 Units Subcutaneous Q4H  . metoprolol  5 mg Intravenous 4 times per day  . piperacillin-tazobactam (ZOSYN)  IV  3.375 g Intravenous Q8H   Continuous Infusions: . dextrose 5 % and  0.45% NaCl 75 mL/hr at 04/09/15 0011    Principal Problem:   Acute gallstone pancreatitis Active Problems:   Essential hypertension   Dyslipidemia   Diabetes mellitus type 2, controlled   Gallstone pancreatitis   Mitral regurgitation   Chronic diastolic CHF (congestive heart failure)   Pre-operative cardiovascular examination, high risk surgery    Time spent: 30 minutes.    Vernell Leep, MD, FACP, FHM. [facility] Pager [phone]  If 7PM-7AM, please contact night-coverage [email or web address] Password [email or web address] 04/09/2015, 3:55 PM    LOS: 1 day

## 2015-04-09 NOTE — Progress Notes (Signed)
Patient ID: Heidi Dominguez, female   DOB: [birth year], 81 y.o.   MRN: [id number]     [facility]      [address]   [place], [place] [address]    Phone: [phone] FAX: [phone]     Subjective: Pain is better.  No n/v.  Afebrile.  WBC normalized.  Lipase down to 653.  Echo at bedside.    Objective:  Vital signs:  Filed Vitals:   04/08/15 1020 04/08/15 1506 04/08/15 2000 04/09/15 0500  BP:  165/60 159/57 145/73  Pulse:  76 72 87  Temp:  98.9 F (37.2 C) 98.6 F (37 C) 98 F (36.7 C)  TempSrc:  Oral Oral Oral  Resp:  18 18 18   Height: 5\' 6"  (1.676 m)     Weight:    90 kg (198 lb 6.6 oz)  SpO2:  91% 92% 94%    Last BM Date: 04/07/15  Intake/Output   Yesterday:  08/25 0701 - 08/26 0700 In: 360 [P.O.:360] Out: -  This shift: I/O last 3 completed shifts: In: 360 [P.O.:360] Out: -     Physical Exam: General: Pt awake/alert/oriented x4 in no acute distress  Abdomen: Soft.  Nondistended. Mild ttp to RUQ/epigastric region.     Problem List:   Principal Problem:   Acute gallstone pancreatitis Active Problems:   Essential hypertension   Dyslipidemia   Diabetes mellitus type 2, controlled   Gallstone pancreatitis   Mitral regurgitation   Chronic diastolic CHF (congestive heart failure)    Results:   Labs: Results for orders placed or performed during the hospital encounter of 04/07/15 (from the past 48 hour(s))  Lipase, blood     Status: Abnormal   Collection Time: 04/07/15 10:25 PM  Result Value Ref Range   Lipase 2374 (H) 22 - 51 U/L    Comment: RESULTS CONFIRMED BY MANUAL DILUTION  Comprehensive metabolic panel     Status: Abnormal   Collection Time: 04/07/15 10:25 PM  Result Value Ref Range   Sodium 138 135 - 145 mmol/L   Potassium 3.8 3.5 - 5.1 mmol/L   Chloride 100 (L) 101 - 111 mmol/L   CO2 30 22 - 32 mmol/L   Glucose, Bld 196 (H) 65 - 99 mg/dL   BUN 19 6 - 20 mg/dL   Creatinine, Ser 7.56  0.44 - 1.00 mg/dL   Calcium 9.7 8.9 - 43.3 mg/dL   Total Protein 7.4 6.5 - 8.1 g/dL   Albumin 3.8 3.5 - 5.0 g/dL   AST 295 (H) 15 - 41 U/L   ALT 216 (H) 14 - 54 U/L   Alkaline Phosphatase 130 (H) 38 - 126 U/L   Total Bilirubin 1.4 (H) 0.3 - 1.2 mg/dL   GFR calc non Af Amer 56 (L) >60 mL/min   GFR calc Af Amer >60 >60 mL/min    Comment: (NOTE) The eGFR has been calculated using the CKD EPI equation. This calculation has not been validated in all clinical situations. eGFR's persistently <60 mL/min signify possible Chronic Kidney Disease.    Anion gap 8 5 - 15  CBC     Status: Abnormal   Collection Time: 04/07/15 10:25 PM  Result Value Ref Range   WBC 15.6 (H) 4.0 - 10.5 K/uL   RBC 4.69 3.87 - 5.11 MIL/uL   Hemoglobin 14.2 12.0 - 15.0 g/dL   HCT 18.8 41.6 - 60.6 %   MCV 94.9 78.0 - 100.0 fL   MCH  30.3 26.0 - 34.0 pg   MCHC 31.9 30.0 - 36.0 g/dL   RDW 16.1 09.6 - 04.5 %   Platelets 161 150 - 400 K/uL  Urinalysis, Routine w reflex microscopic (not at [facility])     Status: Abnormal   Collection Time: 04/07/15 11:12 PM  Result Value Ref Range   Color, Urine YELLOW YELLOW   APPearance TURBID (A) CLEAR   Specific Gravity, Urine 1.016 1.005 - 1.030   pH 6.0 5.0 - 8.0   Glucose, UA NEGATIVE NEGATIVE mg/dL   Hgb urine dipstick MODERATE (A) NEGATIVE   Bilirubin Urine NEGATIVE NEGATIVE   Ketones, ur NEGATIVE NEGATIVE mg/dL   Protein, ur 30 (A) NEGATIVE mg/dL   Urobilinogen, UA 1.0 0.0 - 1.0 mg/dL   Nitrite NEGATIVE NEGATIVE   Leukocytes, UA LARGE (A) NEGATIVE  Urine microscopic-add on     Status: None   Collection Time: 04/07/15 11:12 PM  Result Value Ref Range   Urine-Other FIELD OBSCURED BY RBC'S     Comment: FIELD OBSCURED BY WBC'S  Glucose, capillary     Status: Abnormal   Collection Time: 04/08/15  4:24 AM  Result Value Ref Range   Glucose-Capillary 138 (H) 65 - 99 mg/dL  Hepatic function panel     Status: Abnormal   Collection Time: 04/08/15  5:50 AM  Result Value Ref Range    Total Protein 6.1 (L) 6.5 - 8.1 g/dL   Albumin 3.1 (L) 3.5 - 5.0 g/dL   AST 409 (H) 15 - 41 U/L   ALT 154 (H) 14 - 54 U/L   Alkaline Phosphatase 95 38 - 126 U/L   Total Bilirubin 0.5 0.3 - 1.2 mg/dL   Bilirubin, Direct 0.1 0.1 - 0.5 mg/dL   Indirect Bilirubin 0.4 0.3 - 0.9 mg/dL  Lipase, blood     Status: Abnormal   Collection Time: 04/08/15  5:50 AM  Result Value Ref Range   Lipase 1645 (H) 22 - 51 U/L    Comment: RESULTS CONFIRMED BY MANUAL DILUTION  Basic metabolic panel     Status: Abnormal   Collection Time: 04/08/15  5:50 AM  Result Value Ref Range   Sodium 141 135 - 145 mmol/L   Potassium 3.7 3.5 - 5.1 mmol/L   Chloride 107 101 - 111 mmol/L   CO2 29 22 - 32 mmol/L   Glucose, Bld 130 (H) 65 - 99 mg/dL   BUN 16 6 - 20 mg/dL   Creatinine, Ser 8.11 0.44 - 1.00 mg/dL   Calcium 9.0 8.9 - 91.4 mg/dL   GFR calc non Af Amer >60 >60 mL/min   GFR calc Af Amer >60 >60 mL/min    Comment: (NOTE) The eGFR has been calculated using the CKD EPI equation. This calculation has not been validated in all clinical situations. eGFR's persistently <60 mL/min signify possible Chronic Kidney Disease.    Anion gap 5 5 - 15  CBC     Status: Abnormal   Collection Time: 04/08/15  5:50 AM  Result Value Ref Range   WBC 11.2 (H) 4.0 - 10.5 K/uL   RBC 4.11 3.87 - 5.11 MIL/uL   Hemoglobin 12.4 12.0 - 15.0 g/dL   HCT 78.2 95.6 - 21.3 %   MCV 95.1 78.0 - 100.0 fL   MCH 30.2 26.0 - 34.0 pg   MCHC 31.7 30.0 - 36.0 g/dL   RDW 08.6 57.8 - 46.9 %   Platelets 142 (L) 150 - 400 K/uL  Glucose, capillary  Status: Abnormal   Collection Time: 04/08/15  7:46 AM  Result Value Ref Range   Glucose-Capillary 102 (H) 65 - 99 mg/dL   Comment 1 Notify RN   Lipid panel     Status: None   Collection Time: 04/08/15  8:00 AM  Result Value Ref Range   Cholesterol 147 0 - 200 mg/dL   Triglycerides 52 <409 mg/dL   HDL 62 >81 mg/dL   Total CHOL/HDL Ratio 2.4 RATIO   VLDL 10 0 - 40 mg/dL   LDL Cholesterol 75 0  - 99 mg/dL    Comment:        Total Cholesterol/HDL:CHD Risk Coronary Heart Disease Risk Table                     Men   Women  1/2 Average Risk   3.4   3.3  Average Risk       5.0   4.4  2 X Average Risk   9.6   7.1  3 X Average Risk  23.4   11.0        Use the calculated Patient Ratio above and the CHD Risk Table to determine the patient's CHD Risk.        ATP III CLASSIFICATION (LDL):  <100     mg/dL   Optimal  191-478  mg/dL   Near or Above                    Optimal  130-159  mg/dL   Borderline  295-621  mg/dL   High  >308     mg/dL   Very High Performed at [facility]   Glucose, capillary     Status: Abnormal   Collection Time: 04/08/15 12:02 PM  Result Value Ref Range   Glucose-Capillary 119 (H) 65 - 99 mg/dL   Comment 1 Notify RN   Glucose, capillary     Status: None   Collection Time: 04/08/15  5:07 PM  Result Value Ref Range   Glucose-Capillary 97 65 - 99 mg/dL   Comment 1 Notify RN   Glucose, capillary     Status: None   Collection Time: 04/08/15  8:08 PM  Result Value Ref Range   Glucose-Capillary 93 65 - 99 mg/dL   Comment 1 Notify RN   Glucose, capillary     Status: None   Collection Time: 04/08/15 11:55 PM  Result Value Ref Range   Glucose-Capillary 75 65 - 99 mg/dL  Glucose, capillary     Status: Abnormal   Collection Time: 04/09/15  4:10 AM  Result Value Ref Range   Glucose-Capillary 102 (H) 65 - 99 mg/dL  Comprehensive metabolic panel     Status: Abnormal   Collection Time: 04/09/15  5:20 AM  Result Value Ref Range   Sodium 141 135 - 145 mmol/L   Potassium 3.3 (L) 3.5 - 5.1 mmol/L   Chloride 105 101 - 111 mmol/L   CO2 27 22 - 32 mmol/L   Glucose, Bld 106 (H) 65 - 99 mg/dL   BUN 13 6 - 20 mg/dL   Creatinine, Ser 6.57 0.44 - 1.00 mg/dL   Calcium 9.1 8.9 - 84.6 mg/dL   Total Protein 6.2 (L) 6.5 - 8.1 g/dL   Albumin 3.0 (L) 3.5 - 5.0 g/dL   AST 64 (H) 15 - 41 U/L   ALT 98 (H) 14 - 54 U/L   Alkaline Phosphatase 81 38 - 126 U/L  Total  Bilirubin 0.6 0.3 - 1.2 mg/dL   GFR calc non Af Amer >60 >60 mL/min   GFR calc Af Amer >60 >60 mL/min    Comment: (NOTE) The eGFR has been calculated using the CKD EPI equation. This calculation has not been validated in all clinical situations. eGFR's persistently <60 mL/min signify possible Chronic Kidney Disease.    Anion gap 9 5 - 15  CBC     Status: Abnormal   Collection Time: 04/09/15  5:20 AM  Result Value Ref Range   WBC 8.0 4.0 - 10.5 K/uL   RBC 4.19 3.87 - 5.11 MIL/uL   Hemoglobin 12.5 12.0 - 15.0 g/dL   HCT 16.1 09.6 - 04.5 %   MCV 95.5 78.0 - 100.0 fL   MCH 29.8 26.0 - 34.0 pg   MCHC 31.3 30.0 - 36.0 g/dL   RDW 40.9 81.1 - 91.4 %   Platelets 139 (L) 150 - 400 K/uL  Lipase, blood     Status: Abnormal   Collection Time: 04/09/15  5:20 AM  Result Value Ref Range   Lipase 653 (H) 22 - 51 U/L    Comment: RESULTS CONFIRMED BY MANUAL DILUTION  Glucose, capillary     Status: None   Collection Time: 04/09/15  7:38 AM  Result Value Ref Range   Glucose-Capillary 83 65 - 99 mg/dL    Imaging / Studies: Dg Chest 2 View  04/08/2015   CLINICAL DATA:  Mid chest pain today, history of CHF, pneumonia, and diabetes  EXAM: CHEST  2 VIEW  COMPARISON:  Chest x-ray of March 31, 2015  FINDINGS: The lungs are adequately inflated and clear. There is no interstitial or alveolar edema. The heart is mildly enlarged. The central pulmonary vascularity is engorged. There is mild tortuosity of the descending thoracic aorta. There is no significant pleural effusion. There prosthetic shoulder joints bilaterally. The observed thoracic vertebral bodies are preserved in height.  IMPRESSION: Mild prominence of the cardiac silhouette and central pulmonary vascularity extension weighted by the AP projection. There may be low-grade compensated CHF however. There is no evidence of pneumonia.   Electronically Signed   By: David  [place] M.D.   On: 04/08/2015 16:57   US Abdomen Limited Ruq  04/08/2015   CLINICAL  DATA:  81 year old female with epigastric and right upper quadrant abdominal pain and elevated LFTs. Question gallstones.  EXAM: US ABDOMEN LIMITED - RIGHT UPPER QUADRANT  COMPARISON:  CT 08/07/2012  FINDINGS: Gallbladder:  Distended with 8 mm stone in the neck. No gallbladder wall thickening, wall thickness of 3 mm. No sonographic Murphy sign noted.  Common bile duct:  Diameter: 13.8 mm, distended. The more distal common bile duct is obscured.  Liver:  No focal lesion identified. Within normal limits in parenchymal echogenicity.  IMPRESSION: 1. Distended gallbladder with cholelithiasis, 8 mm stone in the neck. No secondary findings of cholecystitis. 2. Distended common bile duct, up to 13.8 mm. However, this appears unchanged on comparison to prior CT of 2013.   Electronically Signed   By: Rubye Oaks M.D.   On: 04/08/2015 00:28    Medications / Allergies:  Scheduled Meds: . insulin aspart  0-9 Units Subcutaneous Q4H  . metoprolol  5 mg Intravenous 4 times per day  . piperacillin-tazobactam (ZOSYN)  IV  3.375 g Intravenous Q8H  . potassium chloride  10 mEq Intravenous Q1 Hr x 4   Continuous Infusions: . dextrose 5 % and 0.45% NaCl 75 mL/hr at 04/09/15 0011  PRN Meds:.acetaminophen **OR** acetaminophen, hydrALAZINE, levalbuterol, morphine injection, ondansetron **OR** ondansetron (ZOFRAN) IV, polyvinyl alcohol  Antibiotics: Anti-infectives    Start     Dose/Rate Route Frequency Ordered Stop   04/08/15 1400  piperacillin-tazobactam (ZOSYN) IVPB 3.375 g     3.375 g 12.5 mL/hr over 240 Minutes Intravenous Every 8 hours 04/08/15 0426     04/08/15 0400  piperacillin-tazobactam (ZOSYN) IVPB 3.375 g     3.375 g 100 mL/hr over 30 Minutes Intravenous  Once 04/08/15 0353 04/08/15 0458        Assessment/Plan Gallstone pancreatitis-pain has improved, but still somewhat tender.  Lipase down to 653.  Echocardiogram is pending.  I had a long conversation with the patient and her daughter [name]  and are leaning towards surgery.  Will await cards and pulm final recs and for pancreas to "cool off."  Dr. Michaell Cowing is on call this weekend and all next week who will make final determination regarding surgery/timing. VTE prophylaxis-SCD, may add chemical prophylaxis  ID-zosyn for suspected cholecystitis  CV-hx cCHF, HTN FEN-IVF, NPO   Wenonah Milo, ANP-BC [facility] Pager [phone]([id number]-4:30P)   04/09/2015 8:31 AM

## 2015-04-09 NOTE — Clinical Documentation Improvement (Signed)
Internal Medicine  Would you please clarify medical condition associated with clinical findings of large leukocytes in urine?  UTI   Other  Clinically Undetermined  Document any associated diagnoses/conditions.   Supporting Information: Large amount of leukocytes in urine.   Please exercise your independent, professional judgment when responding. A specific answer is not anticipated or expected.   Thank You,  [place]

## 2015-04-09 NOTE — Telephone Encounter (Signed)
lmtcb

## 2015-04-10 DIAGNOSIS — E119 Type 2 diabetes mellitus without complications: Secondary | ICD-10-CM

## 2015-04-10 LAB — CBC
HEMATOCRIT: 42.2 % (ref 36.0–46.0)
HEMOGLOBIN: 13 g/dL (ref 12.0–15.0)
MCH: 29.5 pg (ref 26.0–34.0)
MCHC: 30.8 g/dL (ref 30.0–36.0)
MCV: 95.7 fL (ref 78.0–100.0)
Platelets: 145 10*3/uL — ABNORMAL LOW (ref 150–400)
RBC: 4.41 MIL/uL (ref 3.87–5.11)
RDW: 14.2 % (ref 11.5–15.5)
WBC: 8.7 10*3/uL (ref 4.0–10.5)

## 2015-04-10 LAB — COMPREHENSIVE METABOLIC PANEL
ALBUMIN: 3 g/dL — AB (ref 3.5–5.0)
ALT: 67 U/L — AB (ref 14–54)
AST: 36 U/L (ref 15–41)
Alkaline Phosphatase: 73 U/L (ref 38–126)
Anion gap: 7 (ref 5–15)
BILIRUBIN TOTAL: 1 mg/dL (ref 0.3–1.2)
BUN: 10 mg/dL (ref 6–20)
CO2: 27 mmol/L (ref 22–32)
CREATININE: 0.81 mg/dL (ref 0.44–1.00)
Calcium: 9.3 mg/dL (ref 8.9–10.3)
Chloride: 105 mmol/L (ref 101–111)
GFR calc Af Amer: >60 mL/min (ref >60)
GLUCOSE: 115 mg/dL — AB (ref 65–99)
POTASSIUM: 3.5 mmol/L (ref 3.5–5.1)
Sodium: 139 mmol/L (ref 135–145)
TOTAL PROTEIN: 6.2 g/dL — AB (ref 6.5–8.1)

## 2015-04-10 LAB — GLUCOSE, CAPILLARY
Glucose-Capillary: 99 mg/dL (ref 65–99)
Glucose-Capillary: 166 mg/dL — ABNORMAL HIGH (ref 65–99)
Glucose-Capillary: 187 mg/dL — ABNORMAL HIGH (ref 65–99)
Glucose-Capillary: 97 mg/dL (ref 65–99)

## 2015-04-10 LAB — LIPASE, BLOOD: Lipase: 211 U/L — ABNORMAL HIGH (ref 22–51)

## 2015-04-10 MED ORDER — POTASSIUM CHLORIDE CRYS ER 20 MEQ PO TBCR
40.0000 meq | EXTENDED_RELEASE_TABLET | Freq: Once | ORAL | Status: AC
  Administered 2015-04-10: 40 meq via ORAL
  Filled 2015-04-10: qty 2

## 2015-04-10 NOTE — Progress Notes (Signed)
PROGRESS NOTE    TURQUOISE ESCH TTS:[id number] DOB: 11-Apr-1921 DOA: 04/07/2015 PCP: Mathews Argyle, MD  HPI/Brief narrative 81 year old female patient with history of DM 2, HTN, chronic diastolic CHF, HLD, GERD, chronic wheezing/dyspnea (recently saw pulmonology), presented to the hospital with abdominal pain and diagnosed with acute gallstone pancreatitis. Surgery have consulted and are considering cholecystectomy once pancreatitis resolves and have requested cardiology and pulmonary consultation for preop risk assessment.   Assessment/Plan:  Acute gallstone pancreatitis - Treating supportively with bowel rest, IV fluids and pain management. - Clinically improved. Lipase has decreased from 2374 > 1645 >653>211. Transaminases have nearly normalized - As per general surgery, okay to start clear liquids.  Symptomatic cholelithiasis with possible cholecystitis - Ultrasound abdomen confirms distended gallbladder with gallstones and a 8 mm stone at the neck of the gallbladder. No secondary findings of cholecystitis. CBD dilated up to 13.8 mm but stable compared to CT 2013. - Surgical consultation appreciated and are considering cholecystectomy pending improvement of pancreatitis. Surgeons have requested cardiology and pulmonology consultation for preoperative risk evaluation - Continue IV Zosyn - As per cardiology, intermediate risk for perioperative cardiac complications. As per pulmonary, moderate risk for perioperative pulmonary complications. No restrictions to surgery. - As as per discussion with Dr. Lucia Gaskins, surgery for tomorrow or Monday.  Essential hypertension - Mildly uncontrolled. Started bisoprolol 8/26.  Type II DM, controlled - On NovoLog SSI. - As per PCP, well controlled based on recent A1c in May in the low 6 range.  HLD - Lipid panel unremarkable  Chronic diastolic CHF - Compensated  Dyspnea/wheezing - Requested pulmonary consultation for assessment,  management and preop risk evaluation. - As per outpatient pulmonary evaluation, switch from ACEI to ARB (done with mild improvement) with next step being switching to selective beta blocker (Bystolic/Bisoprolol) - IV metoprolol-changed to oral bisoprolol - No wheezing.  Hypokalemia - Replace and follow as needed.  Thrombocytopenia - Follow CBCs. ? Related to acute illness/infection or Abx. Better.   DVT prophylaxis: SCDs Code Status: Full Family Communication: Discussed with son at bedside on 8/26. None at bedside today. Disposition Plan: DC home when medically stable   Consultants:  General surgery  Cardiology  Pulmonology  Procedures:  None  Antibiotics:  IV Zosyn   Subjective: Denies abdominal pain.  Objective: Filed Vitals:   04/09/15 1300 04/09/15 1443 04/09/15 2039 04/10/15 0401  BP: 182/63 157/56 162/55 169/67  Pulse: 67  70 73  Temp: 98.5 F (36.9 C)  98.6 F (37 C) 98.1 F (36.7 C)  TempSrc: Oral  Oral Oral  Resp: 20  18 20   Height:      Weight:    86.6 kg (190 lb 14.7 oz)  SpO2: 99%  96% 92%    Intake/Output Summary (Last 24 hours) at 04/10/15 1419 Last data filed at 04/10/15 1027  Gross per 24 hour  Intake   1200 ml  Output      0 ml  Net   1200 ml   Filed Weights   04/08/15 0455 04/09/15 0500 04/10/15 0401  Weight: 90 kg (198 lb 6.6 oz) 90 kg (198 lb 6.6 oz) 86.6 kg (190 lb 14.7 oz)     Exam:  General exam: pleasant elderly female lying comfortably propped up in bed. Respiratory system: Clear. No increased work of breathing. Cardiovascular system: S1 & S2 heard, RRR. No JVD, murmurs, gallops, clicks or pedal edema. Gastrointestinal system: Abdomen is nondistended, soft and nontender. Normal bowel sounds heard. Central nervous system: Alert and oriented.  No focal neurological deficits. Extremities: Symmetric 5 x 5 power.   Data Reviewed: Basic Metabolic Panel:  Recent Labs Lab 04/07/15 2225 04/08/15 0550 04/09/15 0520  04/10/15 0541  NA 138 141 141 139  K 3.8 3.7 3.3* 3.5  CL 100* 107 105 105  CO2 30 29 27 27   GLUCOSE 196* 130* 106* 115*  BUN 19 16 13 10   CREATININE 0.87 0.75 0.81 0.81  CALCIUM 9.7 9.0 9.1 9.3   Liver Function Tests:  Recent Labs Lab 04/07/15 2225 04/08/15 0550 04/09/15 0520 04/10/15 0541  AST 375* 158* 64* 36  ALT 216* 154* 98* 67*  ALKPHOS 130* 95 81 73  BILITOT 1.4* 0.5 0.6 1.0  PROT 7.4 6.1* 6.2* 6.2*  ALBUMIN 3.8 3.1* 3.0* 3.0*    Recent Labs Lab 04/07/15 2225 04/08/15 0550 04/09/15 0520 04/10/15 0541  LIPASE 2374* 1645* 653* 211*   No results for input(s): AMMONIA in the last 168 hours. CBC:  Recent Labs Lab 04/07/15 2225 04/08/15 0550 04/09/15 0520 04/10/15 0541  WBC 15.6* 11.2* 8.0 8.7  HGB 14.2 12.4 12.5 13.0  HCT 44.5 39.1 40.0 42.2  MCV 94.9 95.1 95.5 95.7  PLT 161 142* 139* 145*   Cardiac Enzymes: No results for input(s): CKTOTAL, CKMB, CKMBINDEX, TROPONINI in the last 168 hours. BNP (last 3 results) No results for input(s): PROBNP in the last 8760 hours. CBG:  Recent Labs Lab 04/09/15 1126 04/09/15 1608 04/09/15 2042 04/10/15 0802 04/10/15 1159  GLUCAP 104* 81 89 99 166*    No results found for this or any previous visit (from the past 240 hour(s)).         Studies: Dg Chest 2 View  04/08/2015   CLINICAL DATA:  Mid chest pain today, history of CHF, pneumonia, and diabetes  EXAM: CHEST  2 VIEW  COMPARISON:  Chest x-ray of March 31, 2015  FINDINGS: The lungs are adequately inflated and clear. There is no interstitial or alveolar edema. The heart is mildly enlarged. The central pulmonary vascularity is engorged. There is mild tortuosity of the descending thoracic aorta. There is no significant pleural effusion. There prosthetic shoulder joints bilaterally. The observed thoracic vertebral bodies are preserved in height.  IMPRESSION: Mild prominence of the cardiac silhouette and central pulmonary vascularity extension weighted by  the AP projection. There may be low-grade compensated CHF however. There is no evidence of pneumonia.   Electronically Signed   By: David  Martinique M.D.   On: 04/08/2015 16:57        Scheduled Meds: . bisoprolol  5 mg Oral Daily  . insulin aspart  0-9 Units Subcutaneous Q4H  . piperacillin-tazobactam (ZOSYN)  IV  3.375 g Intravenous Q8H   Continuous Infusions: . dextrose 5 % and 0.45% NaCl 75 mL/hr at 04/09/15 2242    Principal Problem:   Acute gallstone pancreatitis Active Problems:   Essential hypertension   Dyslipidemia   Diabetes mellitus type 2, controlled   Gallstone pancreatitis   Mitral regurgitation   Chronic diastolic CHF (congestive heart failure)   Pre-operative cardiovascular examination, high risk surgery    Time spent: 15 minutes.    Vernell Leep, MD, FACP, FHM. [facility] Pager [phone]  If 7PM-7AM, please contact night-coverage [email or web address] Password [email or web address] 04/10/2015, 2:19 PM    LOS: 2 days

## 2015-04-10 NOTE — Progress Notes (Signed)
General Surgery Note  LOS: 2 days  POD -     Assessment/Plan: 1.  Gall stone pancreatitis  US - 04/07/2015 - 1. Distended gallbladder with cholelithiasis, 8 mm stone in the neck. No secondary findings of cholecystitis.  2. Distended common bile duct, up to 13.8 mm. However, this appears unchanged on comparison to prior CT of 2013.  Zosyn - 8/25 >>>  Lipase - 211 - 04/10/2015  Looks good today.  The OR is having problems with cooling/humidity - so her surgery will not be today.  Will give clear liquids and aim for surgery tomorrow or Monday.  2.  Cholelithiasis 3.  HTN 4.  DM, type 2 5.  History of CHF  Seen by Dr. Liane Comber  Moderate mitral regurgitation 6   Minimal thrombocytopenia - plts - 145,000 - 04/10/2015  7.  DVT prophylaxis - on hold 8.  On isolation for ESBL   Principal Problem:   Acute gallstone pancreatitis Active Problems:   Essential hypertension   Dyslipidemia   Diabetes mellitus type 2, controlled   Gallstone pancreatitis   Mitral regurgitation   Chronic diastolic CHF (congestive heart failure)   Pre-operative cardiovascular examination, high risk surgery  Subjective:  Doing well.  Essentially no pain.  I spoke to the daughter, [name] [phone]), about the surgery.  I reviewed with her all the findings.  The family understands the risks and potential benefits. Objective:   Filed Vitals:   04/10/15 0401  BP: 169/67  Pulse: 73  Temp: 98.1 F (36.7 C)  Resp: 20     Intake/Output from previous day:  08/26 0701 - 08/27 0700 In: 840 [I.V.:840] Out: -   Intake/Output this shift:      Physical Exam:   General: Older AA F who is alert and oriented.  She looks younger than her stated age   13: Normal. Pupils equal. .   Lungs: Clear.   Abdomen: Soft.   Lab Results:    Recent Labs  04/09/15 0520 04/10/15 0541  WBC 8.0 8.7  HGB 12.5 13.0  HCT 40.0 42.2  PLT 139* 145*    BMET   Recent Labs  04/09/15 0520 04/10/15 0541  NA 141 139  K  3.3* 3.5  CL 105 105  CO2 27 27  GLUCOSE 106* 115*  BUN 13 10  CREATININE 0.81 0.81  CALCIUM 9.1 9.3    PT/INR  No results for input(s): LABPROT, INR in the last 72 hours.  ABG  No results for input(s): PHART, HCO3 in the last 72 hours.  Invalid input(s): PCO2, PO2   Studies/Results:  Dg Chest 2 View  04/08/2015   CLINICAL DATA:  Mid chest pain today, history of CHF, pneumonia, and diabetes  EXAM: CHEST  2 VIEW  COMPARISON:  Chest x-ray of March 31, 2015  FINDINGS: The lungs are adequately inflated and clear. There is no interstitial or alveolar edema. The heart is mildly enlarged. The central pulmonary vascularity is engorged. There is mild tortuosity of the descending thoracic aorta. There is no significant pleural effusion. There prosthetic shoulder joints bilaterally. The observed thoracic vertebral bodies are preserved in height.  IMPRESSION: Mild prominence of the cardiac silhouette and central pulmonary vascularity extension weighted by the AP projection. There may be low-grade compensated CHF however. There is no evidence of pneumonia.   Electronically Signed   By: Shealyn Sean  Martin[place] M.D.   On: 04/08/2015 16:57     Anti-infectives:   Anti-infectives    Start  Dose/Rate Route Frequency Ordered Stop   04/08/15 1400  piperacillin-tazobactam (ZOSYN) IVPB 3.375 g     3.375 g 12.5 mL/hr over 240 Minutes Intravenous Every 8 hours 04/08/15 0426     04/08/15 0400  piperacillin-tazobactam (ZOSYN) IVPB 3.375 g     3.375 g 100 mL/hr over 30 Minutes Intravenous  Once 04/08/15 0353 04/08/15 0458      Alphonsa Overall, MD, FACS Pager: [facility] Office: [phone] 04/10/2015

## 2015-04-11 ENCOUNTER — Inpatient Hospital Stay (HOSPITAL_COMMUNITY): Payer: Medicare Other | Admitting: Certified Registered"

## 2015-04-11 ENCOUNTER — Encounter (HOSPITAL_COMMUNITY): Payer: Self-pay | Admitting: Certified Registered"

## 2015-04-11 ENCOUNTER — Inpatient Hospital Stay (HOSPITAL_COMMUNITY): Payer: Medicare Other

## 2015-04-11 ENCOUNTER — Encounter (HOSPITAL_COMMUNITY)
Admission: EM | Disposition: A | Discharge status: Skilled Nursing Facility | Payer: Self-pay | Source: Home / Self Care | Attending: Internal Medicine

## 2015-04-11 DIAGNOSIS — K8040 Calculus of bile duct with cholecystitis, unspecified, without obstruction: Secondary | ICD-10-CM

## 2015-04-11 DIAGNOSIS — K8044 Calculus of bile duct with chronic cholecystitis without obstruction: Secondary | ICD-10-CM

## 2015-04-11 HISTORY — PX: LAPAROSCOPIC CHOLECYSTECTOMY SINGLE SITE WITH INTRAOPERATIVE CHOLANGIOGRAM: SHX6538

## 2015-04-11 LAB — GLUCOSE, CAPILLARY
Glucose-Capillary: 107 mg/dL — ABNORMAL HIGH (ref 65–99)
Glucose-Capillary: 138 mg/dL — ABNORMAL HIGH (ref 65–99)
Glucose-Capillary: 150 mg/dL — ABNORMAL HIGH (ref 65–99)
Glucose-Capillary: 224 mg/dL — ABNORMAL HIGH (ref 65–99)

## 2015-04-11 LAB — CBC
HEMATOCRIT: 45.4 % (ref 36.0–46.0)
Hemoglobin: 14.3 g/dL (ref 12.0–15.0)
MCH: 29.9 pg (ref 26.0–34.0)
MCHC: 31.5 g/dL (ref 30.0–36.0)
MCV: 94.8 fL (ref 78.0–100.0)
Platelets: 154 10*3/uL (ref 150–400)
RBC: 4.79 MIL/uL (ref 3.87–5.11)
RDW: 13.8 % (ref 11.5–15.5)
WBC: 9.2 10*3/uL (ref 4.0–10.5)

## 2015-04-11 LAB — LIPASE, BLOOD: LIPASE: 141 U/L — AB (ref 22–51)

## 2015-04-11 LAB — COMPREHENSIVE METABOLIC PANEL
ALBUMIN: 3.2 g/dL — AB (ref 3.5–5.0)
ALT: 55 U/L — ABNORMAL HIGH (ref 14–54)
AST: 23 U/L (ref 15–41)
Alkaline Phosphatase: 75 U/L (ref 38–126)
Anion gap: 8 (ref 5–15)
BILIRUBIN TOTAL: 0.8 mg/dL (ref 0.3–1.2)
BUN: 8 mg/dL (ref 6–20)
CHLORIDE: 106 mmol/L (ref 101–111)
CO2: 26 mmol/L (ref 22–32)
Calcium: 9.5 mg/dL (ref 8.9–10.3)
Creatinine, Ser: 0.77 mg/dL (ref 0.44–1.00)
GFR calc Af Amer: >60 mL/min (ref >60)
GFR calc non Af Amer: >60 mL/min (ref >60)
GLUCOSE: 128 mg/dL — AB (ref 65–99)
POTASSIUM: 3.9 mmol/L (ref 3.5–5.1)
SODIUM: 140 mmol/L (ref 135–145)
TOTAL PROTEIN: 7.4 g/dL (ref 6.5–8.1)

## 2015-04-11 LAB — SURGICAL PCR SCREEN
MRSA, PCR: NEGATIVE
STAPHYLOCOCCUS AUREUS: NEGATIVE

## 2015-04-11 SURGERY — LAPAROSCOPIC CHOLECYSTECTOMY SINGLE SITE WITH INTRAOPERATIVE CHOLANGIOGRAM
Anesthesia: General | Site: Abdomen

## 2015-04-11 MED ORDER — CHLORHEXIDINE GLUCONATE 4 % EX LIQD
1.0000 "application " | Freq: Once | CUTANEOUS | Status: DC
  Filled 2015-04-11: qty 15

## 2015-04-11 MED ORDER — CHLORHEXIDINE GLUCONATE 4 % EX LIQD
1.0000 "application " | Freq: Once | CUTANEOUS | Status: AC
  Administered 2015-04-11: 1 via TOPICAL
  Filled 2015-04-11: qty 15

## 2015-04-11 MED ORDER — TRAMADOL HCL 50 MG PO TABS: 50.0000 mg | ORAL_TABLET | Freq: Four times a day (QID) | ORAL | Status: DC | PRN

## 2015-04-11 MED ORDER — PHENOL 1.4 % MT LIQD: 2.0000 | OROMUCOSAL | Status: DC | PRN

## 2015-04-11 MED ORDER — GLYCOPYRROLATE 0.2 MG/ML IJ SOLN
INTRAMUSCULAR | Status: AC
  Filled 2015-04-11: qty 3

## 2015-04-11 MED ORDER — PROPOFOL 10 MG/ML IV BOLUS
INTRAVENOUS | Status: DC | PRN
  Administered 2015-04-11: 120 mg via INTRAVENOUS

## 2015-04-11 MED ORDER — DIPHENHYDRAMINE HCL 50 MG/ML IJ SOLN
12.5000 mg | Freq: Four times a day (QID) | INTRAMUSCULAR | Status: DC | PRN
Start: 2015-04-11 — End: 2015-04-13

## 2015-04-11 MED ORDER — NEOSTIGMINE METHYLSULFATE 10 MG/10ML IV SOLN
INTRAVENOUS | Status: AC
  Filled 2015-04-11: qty 1

## 2015-04-11 MED ORDER — LACTATED RINGERS IV BOLUS (SEPSIS): 1000.0000 mL | Freq: Three times a day (TID) | INTRAVENOUS | Status: AC | PRN

## 2015-04-11 MED ORDER — INSULIN ASPART 100 UNIT/ML ~~LOC~~ SOLN
0.0000 [IU] | Freq: Three times a day (TID) | SUBCUTANEOUS | Status: DC
  Administered 2015-04-11: 3 [IU] via SUBCUTANEOUS
  Administered 2015-04-12: 1 [IU] via SUBCUTANEOUS
  Administered 2015-04-12: 3 [IU] via SUBCUTANEOUS
  Administered 2015-04-13: 1 [IU] via SUBCUTANEOUS
  Administered 2015-04-13: 2 [IU] via SUBCUTANEOUS

## 2015-04-11 MED ORDER — FENTANYL CITRATE (PF) 100 MCG/2ML IJ SOLN
25.0000 ug | INTRAMUSCULAR | Status: DC | PRN
  Administered 2015-04-11 (×4): 25 ug via INTRAVENOUS

## 2015-04-11 MED ORDER — SODIUM CHLORIDE 0.9 % IJ SOLN
3.0000 mL | INTRAMUSCULAR | Status: DC | PRN
  Administered 2015-04-12: 3 mL via INTRAVENOUS
  Filled 2015-04-11: qty 3

## 2015-04-11 MED ORDER — BISACODYL 10 MG RE SUPP: 10.0000 mg | Freq: Two times a day (BID) | RECTAL | Status: DC | PRN

## 2015-04-11 MED ORDER — MENTHOL 3 MG MT LOZG
1.0000 | LOZENGE | OROMUCOSAL | Status: DC | PRN
  Filled 2015-04-11: qty 9

## 2015-04-11 MED ORDER — SODIUM CHLORIDE 0.9 % IJ SOLN
INTRAMUSCULAR | Status: AC
  Filled 2015-04-11: qty 10

## 2015-04-11 MED ORDER — PROMETHAZINE HCL 25 MG/ML IJ SOLN: 6.2500 mg | INTRAMUSCULAR | Status: DC | PRN

## 2015-04-11 MED ORDER — 0.9 % SODIUM CHLORIDE (POUR BTL) OPTIME
TOPICAL | Status: DC | PRN
  Administered 2015-04-11: 1000 mL

## 2015-04-11 MED ORDER — PIPERACILLIN-TAZOBACTAM 3.375 G IVPB
3.3750 g | Freq: Three times a day (TID) | INTRAVENOUS | Status: AC
  Administered 2015-04-11: 3.375 g via INTRAVENOUS
  Filled 2015-04-11: qty 50

## 2015-04-11 MED ORDER — BUPIVACAINE-EPINEPHRINE 0.25% -1:200000 IJ SOLN
INTRAMUSCULAR | Status: AC
  Filled 2015-04-11: qty 1

## 2015-04-11 MED ORDER — LACTATED RINGERS IV SOLN
INTRAVENOUS | Status: DC | PRN
  Administered 2015-04-11: 07:00:00 via INTRAVENOUS

## 2015-04-11 MED ORDER — IOHEXOL 300 MG/ML  SOLN
INTRAMUSCULAR | Status: DC | PRN
  Administered 2015-04-11: 10 mL

## 2015-04-11 MED ORDER — BUPIVACAINE-EPINEPHRINE 0.25% -1:200000 IJ SOLN
INTRAMUSCULAR | Status: DC | PRN
  Administered 2015-04-11: 80 mL

## 2015-04-11 MED ORDER — LIP MEDEX EX OINT
TOPICAL_OINTMENT | CUTANEOUS | Status: AC
  Filled 2015-04-11: qty 7

## 2015-04-11 MED ORDER — EPHEDRINE SULFATE 50 MG/ML IJ SOLN
INTRAMUSCULAR | Status: AC
  Filled 2015-04-11: qty 1

## 2015-04-11 MED ORDER — BUPIVACAINE-EPINEPHRINE (PF) 0.25% -1:200000 IJ SOLN
INTRAMUSCULAR | Status: AC
  Filled 2015-04-11: qty 30

## 2015-04-11 MED ORDER — GLYCOPYRROLATE 0.2 MG/ML IJ SOLN
INTRAMUSCULAR | Status: DC | PRN
  Administered 2015-04-11: 0.6 mg via INTRAVENOUS
  Administered 2015-04-11: 0.2 mg via INTRAVENOUS

## 2015-04-11 MED ORDER — PROPOFOL 10 MG/ML IV BOLUS
INTRAVENOUS | Status: AC
  Filled 2015-04-11: qty 20

## 2015-04-11 MED ORDER — POLYETHYLENE GLYCOL 3350 17 G PO PACK: 17.0000 g | PACK | Freq: Two times a day (BID) | ORAL | Status: DC | PRN

## 2015-04-11 MED ORDER — ROCURONIUM BROMIDE 100 MG/10ML IV SOLN
INTRAVENOUS | Status: AC
  Filled 2015-04-11: qty 1

## 2015-04-11 MED ORDER — LACTATED RINGERS IR SOLN
Status: DC | PRN
  Administered 2015-04-11: 1

## 2015-04-11 MED ORDER — SODIUM CHLORIDE 0.9 % IV SOLN: 250.0000 mL | INTRAVENOUS | Status: DC | PRN

## 2015-04-11 MED ORDER — SODIUM CHLORIDE 0.9 % IJ SOLN
3.0000 mL | Freq: Two times a day (BID) | INTRAMUSCULAR | Status: DC
Start: 2015-04-11 — End: 2015-04-13
  Administered 2015-04-12 – 2015-04-13 (×3): 3 mL via INTRAVENOUS

## 2015-04-11 MED ORDER — LIP MEDEX EX OINT
1.0000 "application " | TOPICAL_OINTMENT | Freq: Two times a day (BID) | CUTANEOUS | Status: DC
  Administered 2015-04-11 – 2015-04-13 (×5): 1 via TOPICAL

## 2015-04-11 MED ORDER — MAGIC MOUTHWASH
15.0000 mL | Freq: Four times a day (QID) | ORAL | Status: DC | PRN
  Filled 2015-04-11: qty 15

## 2015-04-11 MED ORDER — FENTANYL CITRATE (PF) 100 MCG/2ML IJ SOLN
25.0000 ug | INTRAMUSCULAR | Status: DC | PRN
Start: 2015-04-11 — End: 2015-04-12

## 2015-04-11 MED ORDER — SACCHAROMYCES BOULARDII 250 MG PO CAPS
250.0000 mg | ORAL_CAPSULE | Freq: Two times a day (BID) | ORAL | Status: DC
  Administered 2015-04-11 – 2015-04-13 (×5): 250 mg via ORAL
  Filled 2015-04-11 (×6): qty 1

## 2015-04-11 MED ORDER — NEOSTIGMINE METHYLSULFATE 10 MG/10ML IV SOLN
INTRAVENOUS | Status: DC | PRN
  Administered 2015-04-11: 4 mg via INTRAVENOUS

## 2015-04-11 MED ORDER — ROCURONIUM BROMIDE 100 MG/10ML IV SOLN
INTRAVENOUS | Status: DC | PRN
  Administered 2015-04-11: 5 mg via INTRAVENOUS
  Administered 2015-04-11: 35 mg via INTRAVENOUS
  Administered 2015-04-11: 10 mg via INTRAVENOUS

## 2015-04-11 MED ORDER — SUCCINYLCHOLINE CHLORIDE 20 MG/ML IJ SOLN
INTRAMUSCULAR | Status: DC | PRN
  Administered 2015-04-11: 100 mg via INTRAVENOUS

## 2015-04-11 MED ORDER — TRAMADOL HCL 50 MG PO TABS
50.0000 mg | ORAL_TABLET | Freq: Three times a day (TID) | ORAL | Status: DC | PRN
  Administered 2015-04-11 – 2015-04-12 (×3): 50 mg via ORAL
  Filled 2015-04-11 (×3): qty 1

## 2015-04-11 MED ORDER — POLYETHYLENE GLYCOL 3350 17 G PO PACK
17.0000 g | PACK | Freq: Two times a day (BID) | ORAL | Status: DC
  Administered 2015-04-11 – 2015-04-13 (×4): 17 g via ORAL
  Filled 2015-04-11 (×5): qty 1

## 2015-04-11 MED ORDER — FENTANYL CITRATE (PF) 100 MCG/2ML IJ SOLN
INTRAMUSCULAR | Status: AC
  Filled 2015-04-11: qty 2

## 2015-04-11 MED ORDER — LABETALOL HCL 5 MG/ML IV SOLN
INTRAVENOUS | Status: AC
  Filled 2015-04-11: qty 4

## 2015-04-11 MED ORDER — FENTANYL CITRATE (PF) 250 MCG/5ML IJ SOLN
INTRAMUSCULAR | Status: AC
  Filled 2015-04-11: qty 25

## 2015-04-11 MED ORDER — LIDOCAINE HCL (CARDIAC) 20 MG/ML IV SOLN
INTRAVENOUS | Status: AC
  Filled 2015-04-11: qty 5

## 2015-04-11 MED ORDER — ONDANSETRON HCL 4 MG/2ML IJ SOLN
INTRAMUSCULAR | Status: AC
  Filled 2015-04-11: qty 2

## 2015-04-11 MED ORDER — LABETALOL HCL 5 MG/ML IV SOLN
INTRAVENOUS | Status: DC | PRN
  Administered 2015-04-11 (×3): 5 mg via INTRAVENOUS

## 2015-04-11 MED ORDER — FENTANYL CITRATE (PF) 100 MCG/2ML IJ SOLN
INTRAMUSCULAR | Status: DC | PRN
  Administered 2015-04-11 (×5): 50 ug via INTRAVENOUS

## 2015-04-11 MED ORDER — LIDOCAINE HCL (CARDIAC) 20 MG/ML IV SOLN
INTRAVENOUS | Status: DC | PRN
  Administered 2015-04-11: 50 mg via INTRAVENOUS

## 2015-04-11 MED ORDER — ALUM & MAG HYDROXIDE-SIMETH 200-200-20 MG/5ML PO SUSP: 30.0000 mL | Freq: Four times a day (QID) | ORAL | Status: DC | PRN

## 2015-04-11 MED ORDER — EPHEDRINE SULFATE 50 MG/ML IJ SOLN
INTRAMUSCULAR | Status: DC | PRN
  Administered 2015-04-11: 10 mg via INTRAVENOUS

## 2015-04-11 MED ORDER — ONDANSETRON HCL 4 MG/2ML IJ SOLN
INTRAMUSCULAR | Status: DC | PRN
  Administered 2015-04-11: 4 mg via INTRAVENOUS

## 2015-04-11 MED ORDER — BISMUTH SUBSALICYLATE 262 MG/15ML PO SUSP
30.0000 mL | Freq: Three times a day (TID) | ORAL | Status: DC | PRN
Start: 2015-04-11 — End: 2015-04-13
  Filled 2015-04-11: qty 118

## 2015-04-11 MED ORDER — LACTATED RINGERS IV SOLN
INTRAVENOUS | Status: DC
  Administered 2015-04-11 – 2015-04-12 (×3): via INTRAVENOUS

## 2015-04-11 SURGICAL SUPPLY — 46 items
APPLIER CLIP 5 13 M/L LIGAMAX5 (MISCELLANEOUS) ×3
APPLIER CLIP ROT 10 11.4 M/L (STAPLE)
APR CLP MED LRG 11.4X10 (STAPLE)
APR CLP MED LRG 5 ANG JAW (MISCELLANEOUS) ×2
BAG SPEC RTRVL LRG 6X4 10 (ENDOMECHANICALS) ×2
CABLE HIGH FREQUENCY MONO STRZ (ELECTRODE) ×3 IMPLANT
CHLORAPREP W/TINT 26ML (MISCELLANEOUS) ×3 IMPLANT
CLIP APPLIE 5 13 M/L LIGAMAX5 (MISCELLANEOUS) ×2 IMPLANT
CLIP APPLIE ROT 10 11.4 M/L (STAPLE) IMPLANT
COVER MAYO STAND STRL (DRAPES) ×4 IMPLANT
COVER SURGICAL LIGHT HANDLE (MISCELLANEOUS) ×4 IMPLANT
DECANTER SPIKE VIAL GLASS SM (MISCELLANEOUS) ×4 IMPLANT
DRAIN CHANNEL 19F RND (DRAIN) IMPLANT
DRAPE C-ARM 42X120 X-RAY (DRAPES) ×4 IMPLANT
DRAPE LAPAROSCOPIC ABDOMINAL (DRAPES) ×4 IMPLANT
DRAPE UTILITY XL STRL (DRAPES) ×3 IMPLANT
DRAPE WARM FLUID 44X44 (DRAPE) ×4 IMPLANT
DRSG TEGADERM 2-3/8X2-3/4 SM (GAUZE/BANDAGES/DRESSINGS) ×5 IMPLANT
DRSG TEGADERM 4X4.75 (GAUZE/BANDAGES/DRESSINGS) ×4 IMPLANT
ELECT REM PT RETURN 9FT ADLT (ELECTROSURGICAL) ×3
ELECTRODE REM PT RTRN 9FT ADLT (ELECTROSURGICAL) ×3 IMPLANT
ENDOLOOP SUT PDS II  0 18 (SUTURE) ×1
ENDOLOOP SUT PDS II 0 18 (SUTURE) ×1 IMPLANT
EVACUATOR SILICONE 100CC (DRAIN) IMPLANT
GAUZE SPONGE 2X2 8PLY STRL LF (GAUZE/BANDAGES/DRESSINGS) ×2 IMPLANT
GLOVE ECLIPSE 8.0 STRL XLNG CF (GLOVE) ×4 IMPLANT
GLOVE INDICATOR 8.0 STRL GRN (GLOVE) ×4 IMPLANT
GOWN STRL REUS W/TWL XL LVL3 (GOWN DISPOSABLE) ×12 IMPLANT
KIT BASIN OR (CUSTOM PROCEDURE TRAY) ×4 IMPLANT
PEN SKIN MARKING BROAD (MISCELLANEOUS) ×3 IMPLANT
POUCH SPECIMEN RETRIEVAL 10MM (ENDOMECHANICALS) ×2 IMPLANT
SCISSORS LAP 5X35 DISP (ENDOMECHANICALS) ×3 IMPLANT
SET CHOLANGIOGRAPH MIX (MISCELLANEOUS) ×4 IMPLANT
SET IRRIG TUBING LAPAROSCOPIC (IRRIGATION / IRRIGATOR) ×4 IMPLANT
SHEARS HARMONIC ACE PLUS 36CM (ENDOMECHANICALS) ×3 IMPLANT
SLEEVE XCEL OPT CAN 5 100 (ENDOMECHANICALS) ×2 IMPLANT
SPONGE GAUZE 2X2 STER 10/PKG (GAUZE/BANDAGES/DRESSINGS) ×1
SUT MNCRL AB 4-0 PS2 18 (SUTURE) ×4 IMPLANT
SUT PDS AB 1 CT1 27 (SUTURE) ×4 IMPLANT
SYR 20CC LL (SYRINGE) ×4 IMPLANT
TOWEL OR 17X26 10 PK STRL BLUE (TOWEL DISPOSABLE) ×4 IMPLANT
TOWEL OR NON WOVEN STRL DISP B (DISPOSABLE) ×3 IMPLANT
TRAY LAPAROSCOPIC (CUSTOM PROCEDURE TRAY) ×4 IMPLANT
TROCAR BLADELESS OPT 5 100 (ENDOMECHANICALS) ×4 IMPLANT
TROCAR BLADELESS OPT 5 150 (ENDOMECHANICALS) ×3 IMPLANT
TROCAR XCEL NON-BLD 11X100MML (ENDOMECHANICALS) ×3 IMPLANT

## 2015-04-11 NOTE — Transfer of Care (Signed)
Immediate Anesthesia Transfer of Care Note  Patient: Heidi Dominguez  Procedure(s) Performed: Procedure(s): LAPAROSCOPIC LYSIS OF ADHESIONS, LAPAROSCOPIC CHOLECYSTECTOMY WITH INTRAOPERATIVE CHOLANGIOGRAM (N/A)  Patient Location: PACU  Anesthesia Type:General  Level of Consciousness: awake, alert  and oriented  Airway & Oxygen Therapy: Patient Spontanous Breathing and Patient connected to face mask oxygen  Post-op Assessment: Report given to RN and Post -op Vital signs reviewed and stable  Post vital signs: Reviewed and stable  Last Vitals:  Filed Vitals:   04/11/15 0603  BP: 185/68  Pulse: 74  Temp: 36.8 C  Resp: 18    Complications: No apparent anesthesia complications

## 2015-04-11 NOTE — Care Management Important Message (Signed)
Important Message  Patient Details  Name: Heidi Dominguez MRN: [id number] Date of Birth: 1920/12/26   Medicare Important Message Given:  Yes-second notification given    Apolonio Schneiders, RN 04/11/2015, 11:25 AM

## 2015-04-11 NOTE — Discharge Instructions (Signed)
LAPAROSCOPIC SURGERY: POST OP INSTRUCTIONS ° °1. DIET: Follow a light bland diet the first 24 hours after arrival home, such as soup, liquids, crackers, etc.  Be sure to include lots of fluids daily.  Avoid fast food or heavy meals as your are more likely to get nauseated.  Eat a low fat the next few days after surgery.   °2. Take your usually prescribed home medications unless otherwise directed. °3. PAIN CONTROL: °a. Pain is best controlled by a usual combination of three different methods TOGETHER: °i. Ice/Heat °ii. Over the counter pain medication °iii. Prescription pain medication °b. Most patients will experience some swelling and bruising around the incisions.  Ice packs or heating pads (30-60 minutes up to 6 times a day) will help. Use ice for the first few days to help decrease swelling and bruising, then switch to heat to help relax tight/sore spots and speed recovery.  Some people prefer to use ice alone, heat alone, alternating between ice & heat.  Experiment to what works for you.  Swelling and bruising can take several weeks to resolve.   °c. It is helpful to take an over-the-counter pain medication regularly for the first few weeks.  Choose one of the following that works best for you: °i. Naproxen (Aleve, etc)  Two 220mg tabs twice a day °ii. Ibuprofen (Advil, etc) Three 200mg tabs four times a day (every meal & bedtime) °iii. Acetaminophen (Tylenol, etc) 500-650mg four times a day (every meal & bedtime) °d. A  prescription for pain medication (such as oxycodone, hydrocodone, etc) should be given to you upon discharge.  Take your pain medication as prescribed.  °i. If you are having problems/concerns with the prescription medicine (does not control pain, nausea, vomiting, rash, itching, etc), please call us [phone] to see if we need to switch you to a different pain medicine that will work better for you and/or control your side effect better. °ii. If you need a refill on your pain medication,  please contact your pharmacy.  They will contact our office to request authorization. Prescriptions will not be filled after 5 pm or on week-ends. °4. Avoid getting constipated.  Between the surgery and the pain medications, it is common to experience some constipation.  Increasing fluid intake and taking a fiber supplement (such as Metamucil, Citrucel, FiberCon, MiraLax, etc) 1-2 times a day regularly will usually help prevent this problem from occurring.  A mild laxative (prune juice, Milk of Magnesia, MiraLax, etc) should be taken according to package directions if there are no bowel movements after 48 hours.   °5. Watch out for diarrhea.  If you have many loose bowel movements, simplify your diet to bland foods & liquids for a few days.  Stop any stool softeners and decrease your fiber supplement.  Switching to mild anti-diarrheal medications (Kayopectate, Pepto Bismol) can help.  If this worsens or does not improve, please call us. °6. Wash / shower every day.  You may shower over the dressings as they are waterproof.  Continue to shower over incision(s) after the dressing is off. °7. Remove your waterproof bandages 5 days after surgery.  You may leave the incision open to air.  You may replace a dressing/Band-Aid to cover the incision for comfort if you wish.  °8. ACTIVITIES as tolerated:   °a. You may resume regular (light) daily activities beginning the next day--such as daily self-care, walking, climbing stairs--gradually increasing activities as tolerated.  If you can walk 30 minutes without difficulty, it   is safe to try more intense activity such as jogging, treadmill, bicycling, low-impact aerobics, swimming, etc. b. Save the most intensive and strenuous activity for last such as sit-ups, heavy lifting, contact sports, etc  Refrain from any heavy lifting or straining until you are off narcotics for pain control.   c. DO NOT PUSH THROUGH PAIN.  Let pain be your guide: If it hurts to do something, don't  do it.  Pain is your body warning you to avoid that activity for another week until the pain goes down. d. You may drive when you are no longer taking prescription pain medication, you can comfortably wear a seatbelt, and you can safely maneuver your car and apply brakes. e. Dennis Bast may have sexual intercourse when it is comfortable.  9. FOLLOW UP in our office a. Please call [facility] at [phone] to set up an appointment to see your surgeon in the office for a follow-up appointment approximately 2-3 weeks after your surgery. b. Make sure that you call for this appointment the day you arrive home to insure a convenient appointment time. 10. IF YOU HAVE DISABILITY OR FAMILY LEAVE FORMS, BRING THEM TO THE OFFICE FOR PROCESSING.  DO NOT GIVE THEM TO YOUR DOCTOR.   WHEN TO CALL us [phone]: 1. Poor pain control 2. Reactions / problems with new medications (rash/itching, nausea, etc)  3. Fever over 101.5 F (38.5 C) 4. Inability to urinate 5. Nausea and/or vomiting 6. Worsening swelling or bruising 7. Continued bleeding from incision. 8. Increased pain, redness, or drainage from the incision   The clinic staff is available to answer your questions during regular business hours (8:30am-5pm).  Please dont hesitate to call and ask to speak to one of our nurses for clinical concerns.   If you have a medical emergency, go to the nearest emergency room or call 911.  A surgeon from [facility] is always on call at the [facility], [place], [place], [place], [place]  [address] ? MAIN: [phone] ? TOLL FREE: [phone] ?  FAX [phone] [email or web address]   Acute Pancreatitis Acute pancreatitis is a disease in which the pancreas becomes suddenly inflamed. The pancreas is a large gland located behind your stomach. The pancreas produces enzymes that help digest food. The pancreas also releases the hormones glucagon and  insulin that help regulate blood sugar. Damage to the pancreas occurs when the digestive enzymes from the pancreas are activated and begin attacking the pancreas before being released into the intestine. Most acute attacks last a couple of days and can cause serious complications. Some people become dehydrated and develop low blood pressure. In severe cases, bleeding into the pancreas can lead to shock and can be life-threatening. The lungs, heart, and kidneys may fail. CAUSES  Pancreatitis can happen to anyone. In some cases, the cause is unknown. Most cases are caused by:  Alcohol abuse.  Gallstones. Other less common causes are:  Certain medicines.  Exposure to certain chemicals.  Infection.  Damage caused by an accident (trauma).  Abdominal surgery. SYMPTOMS   Pain in the upper abdomen that may radiate to the back.  Tenderness and swelling of the abdomen.  Nausea and vomiting. DIAGNOSIS  Your caregiver will perform a physical exam. Blood and stool tests may be done to confirm the diagnosis. Imaging tests may also be done, such as X-rays, CT scans, or an ultrasound of the abdomen. TREATMENT  Treatment usually requires a stay  in the hospital. Treatment may include:  Pain medicine.  Fluid replacement through an intravenous line (IV).  Placing a tube in the stomach to remove stomach contents and control vomiting.  Not eating for 3 or 4 days. This gives your pancreas a rest, because enzymes are not being produced that can cause further damage.  Antibiotic medicines if your condition is caused by an infection.  Surgery of the pancreas or gallbladder. HOME CARE INSTRUCTIONS   Follow the diet advised by your caregiver. This may involve avoiding alcohol and decreasing the amount of fat in your diet.  Eat smaller, more frequent meals. This reduces the amount of digestive juices the pancreas produces.  Drink enough fluids to keep your urine clear or pale yellow.  Only take  over-the-counter or prescription medicines as directed by your caregiver.  Avoid drinking alcohol if it caused your condition.  Do not smoke.  Get plenty of rest.  Check your blood sugar at home as directed by your caregiver.  Keep all follow-up appointments as directed by your caregiver. SEEK MEDICAL CARE IF:   You do not recover as quickly as expected.  You develop new or worsening symptoms.  You have persistent pain, weakness, or nausea.  You recover and then have another episode of pain. SEEK IMMEDIATE MEDICAL CARE IF:   You are unable to eat or keep fluids down.  Your pain becomes severe.  You have a fever or persistent symptoms for more than 2 to 3 days.  You have a fever and your symptoms suddenly get worse.  Your skin or the white part of your eyes turn yellow (jaundice).  You develop vomiting.  You feel dizzy, or you faint.  Your blood sugar is high (over 300 mg/dL). MAKE SURE YOU:   Understand these instructions.  Will watch your condition.  Will get help right away if you are not doing well or get worse. Document Released: 07/31/2005 Document Revised: 01/30/2012 Document Reviewed: 11/09/2011 [facility] Patient Information 2015 [place], [place]. This information is not intended to replace advice given to you by your health care provider. Make sure you discuss any questions you have with your health care provider.  Cholelithiasis Cholelithiasis (also called gallstones) is a form of gallbladder disease in which gallstones form in your gallbladder. The gallbladder is an organ that stores bile made in the liver, which helps digest fats. Gallstones begin as small crystals and slowly grow into stones. Gallstone pain occurs when the gallbladder spasms and a gallstone is blocking the duct. Pain can also occur when a stone passes out of the duct.  RISK FACTORS  Being female.   Having multiple pregnancies. Health care providers sometimes advise removing diseased  gallbladders before future pregnancies.   Being obese.  Eating a diet heavy in fried foods and fat.   Being older than 57 years and increasing age.   Prolonged use of medicines containing female hormones.   Having diabetes mellitus.   Rapidly losing weight.   Having a family history of gallstones (heredity).  SYMPTOMS  Nausea.   Vomiting.  Abdominal pain.   Yellowing of the skin (jaundice).   Sudden pain. It may persist from several minutes to several hours.  Fever.   Tenderness to the touch. In some cases, when gallstones do not move into the bile duct, people have no pain or symptoms. These are called "silent" gallstones.  TREATMENT Silent gallstones do not need treatment. In severe cases, emergency surgery may be required. Options for treatment include:  Surgery to remove the gallbladder. This is the most common treatment.  Medicines. These do not always work and may take 6-12 months or more to work.  Shock wave treatment (extracorporeal biliary lithotripsy). In this treatment an ultrasound machine sends shock waves to the gallbladder to break gallstones into smaller pieces that can pass into the intestines or be dissolved by medicine. HOME CARE INSTRUCTIONS   Only take over-the-counter or prescription medicines for pain, discomfort, or fever as directed by your health care provider.   Follow a low-fat diet until seen again by your health care provider. Fat causes the gallbladder to contract, which can result in pain.   Follow up with your health care provider as directed. Attacks are almost always recurrent and surgery is usually required for permanent treatment.  SEEK IMMEDIATE MEDICAL CARE IF:   Your pain increases and is not controlled by medicines.   You have a fever or persistent symptoms for more than 2-3 days.   You have a fever and your symptoms suddenly get worse.   You have persistent nausea and vomiting.  MAKE SURE YOU:    Understand these instructions.  Will watch your condition.  Will get help right away if you are not doing well or get worse. Document Released: 07/27/2005 Document Revised: 04/02/2013 Document Reviewed: 01/22/2013 [facility] Patient Information 2015 [place], [place]. This information is not intended to replace advice given to you by your health care provider. Make sure you discuss any questions you have with your health care provider.  GETTING TO GOOD BOWEL HEALTH. Irregular bowel habits such as constipation and diarrhea can lead to many problems over time.  Having one soft bowel movement a day is the most important way to prevent further problems.  The anorectal canal is designed to handle stretching and feces to safely manage our ability to get rid of solid waste (feces, poop, stool) out of our body.  BUT, hard constipated stools can act like ripping concrete bricks and diarrhea can be a burning fire to this very sensitive area of our body, causing inflamed hemorrhoids, anal fissures, increasing risk is perirectal abscesses, abdominal pain/bloating, an making irritable bowel worse.      The goal: ONE SOFT BOWEL MOVEMENT A DAY!  To have soft, regular bowel movements:   Drink plenty of fluids, consider 4-6 tall glasses of water a day.    Take plenty of fiber.  Fiber is the undigested part of plant food that passes into the colon, acting s natures broom to encourage bowel motility and movement.  Fiber can absorb and hold large amounts of water. This results in a larger, bulkier stool, which is soft and easier to pass. Work gradually over several weeks up to 6 servings a day of fiber (25g a day even more if needed) in the form of: o Vegetables -- Root (potatoes, carrots, turnips), leafy green (lettuce, salad greens, celery, spinach), or cooked high residue (cabbage, broccoli, etc) o Fruit -- Fresh (unpeeled skin & pulp), Dried (prunes, apricots, cherries, etc ),  or stewed ( applesauce)  o Whole  grain breads, pasta, etc (whole wheat)  o Bran cereals   Bulking Agents -- This type of water-retaining fiber generally is easily obtained each day by one of the following:  o Psyllium bran -- The psyllium plant is remarkable because its ground seeds can retain so much water. This product is available as Metamucil, Konsyl, Effersyllium, Per Diem Fiber, or the less expensive generic preparation in drug and health food stores.  Although labeled a laxative, it really is not a laxative.  o Methylcellulose -- This is another fiber derived from wood which also retains water. It is available as Citrucel. o Polyethylene Glycol - and artificial fiber commonly called Miralax or Glycolax.  It is helpful for people with gassy or bloated feelings with regular fiber o Flax Seed - a less gassy fiber than psyllium  No reading or other relaxing activity while on the toilet. If bowel movements take longer than 5 minutes, you are too constipated  AVOID CONSTIPATION.  High fiber and water intake usually takes care of this.  Sometimes a laxative is needed to stimulate more frequent bowel movements, but   Laxatives are not a good long-term solution as it can wear the colon out.  They can help jump-start bowels if constipated, but should be relied on constantly without discussing with your doctor o Osmotics (Milk of Magnesia, Fleets phosphosoda, Magnesium citrate, MiraLax, GoLytely) are safer than  o Stimulants (Senokot, Castor Oil, Dulcolax, Ex Lax)    o Avoid taking laxatives for more than 7 days in a row.   IF SEVERELY CONSTIPATED, try a Bowel Retraining Program: o Do not use laxatives.  o Eat a diet high in roughage, such as bran cereals and leafy vegetables.  o Drink six (6) ounces of prune or apricot juice each morning.  o Eat two (2) large servings of stewed fruit each day.  o Take one (1) heaping tablespoon of a psyllium-based bulking agent twice a day. Use sugar-free sweetener when possible to avoid  excessive calories.  o Eat a normal breakfast.  o Set aside 15 minutes after breakfast to sit on the toilet, but do not strain to have a bowel movement.  o If you do not have a bowel movement by the third day, use an enema and repeat the above steps.   Controlling diarrhea o Switch to liquids and simpler foods for a few days to avoid stressing your intestines further. o Avoid dairy products (especially milk & ice cream) for a short time.  The intestines often can lose the ability to digest lactose when stressed. o Avoid foods that cause gassiness or bloating.  Typical foods include beans and other legumes, cabbage, broccoli, and dairy foods.  Every person has some sensitivity to other foods, so listen to our body and avoid those foods that trigger problems for you. o Adding fiber (Citrucel, Metamucil, psyllium, Miralax) gradually can help thicken stools by absorbing excess fluid and retrain the intestines to act more normally.  Slowly increase the dose over a few weeks.  Too much fiber too soon can backfire and cause cramping & bloating. o Probiotics (such as active yogurt, Align, etc) may help repopulate the intestines and colon with normal bacteria and calm down a sensitive digestive tract.  Most studies show it to be of mild help, though, and such products can be costly. o Medicines: - Bismuth subsalicylate (ex. Kayopectate, Pepto Bismol) every 30 minutes for up to 6 doses can help control diarrhea.  Avoid if pregnant. - Loperamide (Immodium) can slow down diarrhea.  Start with two tablets (4mg  total) first and then try one tablet every 6 hours.  Avoid if you are having fevers or severe pain.  If you are not better or start feeling worse, stop all medicines and call your doctor for advice o Call your doctor if you are getting worse or not better.  Sometimes further testing (cultures, endoscopy, X-ray studies, bloodwork, etc) may be needed  to help diagnose and treat the cause of the  diarrhea.  TROUBLESHOOTING IRREGULAR BOWELS 1) Avoid extremes of bowel movements (no bad constipation/diarrhea) 2) Miralax 17gm mixed in 8oz. water or juice-daily. May use BID as needed.  3) Gas-x,Phazyme, etc. as needed for gas & bloating.  4) Soft,bland diet. No spicy,greasy,fried foods.  5) Prilosec over-the-counter as needed  6) May hold gluten/wheat products from diet to see if symptoms improve.  7)  May try probiotics (Align, Activa, etc) to help calm the bowels down 7) If symptoms become worse call back immediately.  Managing Pain  Pain after surgery or related to activity is often due to strain/injury to muscle, tendon, nerves and/or incisions.  This pain is usually short-term and will improve in a few months.   Many people find it helpful to do the following things TOGETHER to help speed the process of healing and to get back to regular activity more quickly:  1. Avoid heavy physical activity at first a. No lifting greater than 20 pounds at first, then increase to lifting as tolerated over the next few weeks b. Do not push through the pain.  Listen to your body and avoid positions and maneuvers than reproduce the pain.  Wait a few days before trying something more intense c. Walking is okay as tolerated, but go slowly and stop when getting sore.  If you can walk 30 minutes without stopping or pain, you can try more intense activity (running, jogging, aerobics, cycling, swimming, treadmill, sex, sports, weightlifting, etc ) d. Remember: If it hurts to do it, then dont do it!  2. Take Anti-inflammatory medication a. Choose ONE of the following over-the-counter medications: i.            Acetaminophen 500mg  tabs (Tylenol) 1-2 pills with every meal and just before bedtime (avoid if you have liver problems) ii.            Naproxen 220mg  tabs (ex. Aleve) 1-2 pills twice a day (avoid if you have kidney, stomach, IBD, or bleeding problems) iii. Ibuprofen 200mg  tabs (ex. Advil, Motrin)  3-4 pills with every meal and just before bedtime (avoid if you have kidney, stomach, IBD, or bleeding problems) b. Take with food/snack around the clock for 1-2 weeks i. This helps the muscle and nerve tissues become less irritable and calm down faster  3. Use a Heating pad or Ice/Cold Pack a. 4-6 times a day b. May use warm bath/hottub  or showers  4. Try Gentle Massage and/or Stretching  a. at the area of pain many times a day b. stop if you feel pain - do not overdo it  Try these steps together to help you body heal faster and avoid making things get worse.  Doing just one of these things may not be enough.    If you are not getting better after two weeks or are noticing you are getting worse, contact our office for further advice; we may need to re-evaluate you & see what other things we can do to help.

## 2015-04-11 NOTE — Op Note (Signed)
04/11/2015  9:37 AM  PATIENT:  Heidi Dominguez  81 y.o. female  Patient Care Team: Lajean Manes, MD as PCP - General (Internal Medicine)  PRE-OPERATIVE DIAGNOSIS:    Cholecystitis  GALLSTONE PANCREATITIS  POST-OPERATIVE DIAGNOSIS:    ADHESIONS,  CHRONIC CHOLECYSTITIS GALLSTONE PANCREATITIS NONOBSTRUCTING CHOLEDOCOLITHIASIS  PROCEDURE:  Procedure(s): LAPAROSCOPIC LYSIS OF ADHESIONS, LAPAROSCOPIC CHOLECYSTECTOMY WITH INTRAOPERATIVE CHOLANGIOGRAM  SURGEON:  Surgeon(s): Michael Boston, MD  ASSISTANT: RN   ANESTHESIA:   local and general  EBL:     Delay start of Pharmacological VTE agent (>24hrs) due to surgical blood loss or risk of bleeding:  no  DRAINS: NONE   SPECIMEN:  Source of Specimen:  Gallbladder  DISPOSITION OF SPECIMEN:  PATHOLOGY  COUNTS:  YES  PLAN OF CARE: Admit to inpatient   PATIENT DISPOSITION:  PACU - hemodynamically stable.  INDICATION: Pleasant elderly woman with pain and pancreatitis strongly suspicious for gallstone pancreatitis.  Admitted and resuscitated.  Lipase resolving.  Feeling better.  Cleared by cardiology.  Again she is somewhat functional independent, she was to be aggressive and treat with cholecystectomy to progress further attacks.  The anatomy & physiology of hepatobiliary & pancreatic function was discussed.  The pathophysiology of gallbladder dysfunction was discussed.  Natural history risks without surgery was discussed.   I feel the risks of no intervention will lead to serious problems that outweigh the operative risks; therefore, I recommended cholecystectomy to remove the pathology.  I explained laparoscopic techniques with possible need for an open approach.  Probable cholangiogram to evaluate the bilary tract was explained as well.    Risks such as bleeding, infection, abscess, leak, injury to other organs, need for further treatment, heart attack, death, and other risks were discussed.  I noted a good likelihood this will  help address the problem.  Possibility that this will not correct all abdominal symptoms was explained.  Goals of post-operative recovery were discussed as well.  We will work to minimize complications.  An educational handout further explaining the pathology and treatment options was given as well.  Questions were answered.  The patient expresses understanding & wishes to proceed with surgery.  OR FINDINGS: Very dilated boggy gallbladder.  Very dilated biliary system.  Long cystic duct.  No stones and cystic duct.  Liver without fatty change or cirrhosis.  Gland gram notes very dilated biliary system.  Contrast does go into duodenum rather easily.  Few small mobile hypolucencies at distal common bile duct suspicious for nonobstructing choledocholithiasis.  Moderate adhesions infraumbilically.  Some small bowel loops attached to the anterior bowel wall and some internal hernias.  Lysis of adhesions done.  While there were some mild transition points, no definite evidence of bowel obstruction.  DESCRIPTION:   The patient was identified & brought into the operating room. The patient was positioned supine with arms tucked. SCDs were active during the entire case. The patient underwent general anesthesia without any difficulty.  The abdomen was prepped and draped in a sterile fashion. A Surgical Timeout confirmed our plan.  I placed a 49mm laparoscopic port through the abdominal wall using optical entry technique through a supraumbilical incision while using a towel clamp to hold the stalk for fascial countertraction.  Entry was clean.   Encountered small bowel and omentum adherent to the abdominal wall nearby.  We induced carbon dioxide insufflation. Camera inspection revealed no injury.  Because there were adhesions, I did not think she was a good candidate for a single site approach and therefore did  a standard four-port technique.  Placed a 10 mm port in the subxiphoid region.  5 mm ports x2 in the right  upper quadrant flank.  I turned the camera in and viewed from the right upper quadrant.  I did laparoscopic lysis of adhesions with cold scissors and focused harmonic dissection to free greater omentum and small bowel loops off the anterior abdominal wall.  Found some interloop small bowel serosal and mesentery adhesions.  There were some areas of scarring and mildly kinked off small bowel.  After freeing all the adhesions I ran the small bowel and inspected carefully.  There had been some mildly kinked off areas but those were all straightened out now.  No major transition zones.  No chronic strictures.  No evidence of any creeping fat or enteritis.  No evidence of any serosal injury or enterotomy.  Hemostasis was good.  I decided to proceed with cholecystectomy.  I turned attention to the right upper quadrant.  The gallbladder fundus was elevated cephalad.   Patient had moderate adhesions of greater omentum and mesocolon and duodenal sweep to the gallbladder.  I carefully freed that off using focused ultrasonic harmonic dissection and blunt dissection.  Most adhesions were wispy-like cotton candy.  I freed the peritoneal coverings between the gallbladder and the liver on the posteriolateral and anteriomedial walls.   I used careful blunt and hook dissection to help get a good critical view of the cystic artery and cystic duct. I did further dissection to free 2/3 of the  gallbladder off the liver bed to get a good critical view of the infundibulum and cystic duct.  It was rather pedunculated on the infundibular side.  I mobilized the cystic artery; and, after getting a good 360 view, ligated the cystic artery anterior and posterior branches using ultrasonic dissection flush with the gallbladder.  I could see the cystic lymph node optical OP I transected the lymphatic slightly proximally to that so it could come with the specimen..    I placed a clip on the infundibulum. I did a partial cystic duct-otomy and  ensured patency.   I milked back some bile.  No sandy sludge nor cystic duct stones.  Just some clear bile.  I placed a 5 [place] cholangiocatheter through a puncture site at the right subcostal ridge of the abdominal wall and directed it into the cystic duct.  We ran a cholangiogram with dilute radio-opaque contrast and continuous fluoroscopy. Contrast flowed from a side branch consistent with cystic duct cannulization.  The system was very dilated.  Contrast flowed up the common hepatic duct into the right and left intrahepatic chains I could not get enough contrast to reflux up into the liver.   Contrast flowed down the common bile duct easily.  He was very dilated.  That had extravasation.  Laparoscopic exploration prove the catheter had fallen off.  I skeletonized the cystic duct.  Patient had some prominent cystic duct arterial branches 2.  These were skeletonized and transected.  That helped straighten out the cystic duct better.  I replaced the catheter.  I repeated the cholangiogram under fluoroscopy.  Contrast had gone into the duodenum.  A flush across that well even though his white dilated.  I did see some mobile hypolucent granular-like material suspicious for distal common bile duct stones.  However they were not obstructing.    I removed the cholangiocatheter. I placed clips on the cystic duct x4.  I completed cystic duct transection.  Because it was  a rather dilated cystic duct I ligated the cystic duct slightly more proximally using a 0 PDS Endoloop to good result.  I freed the gallbladder from its few remaining attachments to the liver. I ensured hemostasis on the gallbladder fossa of the liver and elsewhere. I inspected the rest of the abdomen & detected no injury nor bleeding elsewhere.  I removed the gallbladder inside an Endo Catch bag.  Was very dilated.  And up opening up the gallbladder inside the bag and suctioning out the bile.  That allowed it to come out more easily..  I closed  the subxiphoid fascia transversely using 0 Vicryl interrupted stitch using a laparoscopic suture passer under direct visualization.  I closed the skin using 4-0 monocryl stitch.  Sterile dressings were applied. The patient was extubated & arrived in the PACU in stable condition..  I had discussed postoperative care with the patient in the holding area.  I am about to locate the patient's family and discuss operative findings and postoperative goals / instructions.  Instructions are written in the chart as well.  We will follow patient clinically and with laboratory values..  If she has worsening liver function tests or recurrent pain, she may benefit from ERCP.  See what gastroenterology thinks.  Otherwise would follow her clinically and see if this could be more to edema of the pancreas from her pancreatitis and will resolve without intervention.  Adin Hector, M.D., F.A.C.S. Gastrointestinal and Minimally Invasive Surgery [facility] [address] [place], [place] [address] [phone] Main / Paging

## 2015-04-11 NOTE — Progress Notes (Signed)
PROGRESS NOTE    Heidi Dominguez:[id number] DOB: [birth year] DOA: 04/07/2015 PCP: Mathews Argyle, MD  HPI/Brief narrative 81 year old female patient with history of DM 2, HTN, chronic diastolic CHF, HLD, GERD, chronic wheezing/dyspnea (recently saw pulmonology), presented to the hospital with abdominal pain and diagnosed with acute gallstone pancreatitis. Surgery have consulted and are considering cholecystectomy once pancreatitis resolves and have requested cardiology and pulmonary consultation for preop risk assessment.   Assessment/Plan:  Acute gallstone pancreatitis - Treatedsupportively with bowel rest, IV fluids and pain management. - Clinically improved. Lipase has decreased from 2374 > 1645 >653>211>141. Transaminases have nearly normalized - Status post laparoscopic cholecystectomy 8/28. - Pancreatitis clinically resolved.  Symptomatic cholelithiasis with chronic cholecystitis & nonobstructive choledocholithiasis, s/p laparoscopic cholecystectomy - Ultrasound abdomen confirms distended gallbladder with gallstones and a 8 mm stone at the neck of the gallbladder. No secondary findings of cholecystitis. CBD dilated up to 13.8 mm but stable compared to CT 2013. - After preop clearance by cardiology and pulmonology, patient underwent laparoscopic cholecystectomy on 8/28.  Essential hypertension - Mildly uncontrolled. Started bisoprolol 8/26. Monitor closely and may consider adding a neck stage and if blood pressures remain uncontrolled over the next 24 hours. When necessary IV hydralazine.  Type II DM, controlled - On NovoLog SSI. - As per PCP, well controlled based on recent A1c in May in the low 6 range.  HLD - Lipid panel unremarkable  Chronic diastolic CHF - Compensated  Dyspnea/wheezing - Requested pulmonary consultation for assessment, management and preop risk evaluation. - As per outpatient pulmonary evaluation, switch from ACEI to ARB (done with mild  improvement) with next step being switching to selective beta blocker (Bystolic/Bisoprolol) - IV metoprolol-changed to oral bisoprolol - No wheezing.  Hypokalemia - Replace and follow as needed.  Thrombocytopenia -Resolved   DVT prophylaxis: SCDs Code Status: Full Family Communication: Discussed with son at bedside on 8/28.  Disposition Plan: DC home when medically stable   Consultants:  General surgery  Cardiology  Pulmonology  Procedures:  Laparoscopic lysis of adhesions, cholecystectomy with IOC on 8/28  Antibiotics:  IV Zosyn   Subjective: Patient was in [place] when came to see her this morning. Seen postoperatively on the floor. Mild soreness at laparoscopic site but otherwise denies complaints. No dyspnea.  Objective: Filed Vitals:   04/11/15 1000 04/11/15 1006 04/11/15 1015 04/11/15 1043  BP: 191/64 191/65 183/67 184/82  Pulse: 61 61 67 69  Temp:    98.4 F (36.9 C)  TempSrc:    Oral  Resp: 16 17 14    Height:      Weight:      SpO2: 99% 100% 100% 100%    Intake/Output Summary (Last 24 hours) at 04/11/15 1307 Last data filed at 04/11/15 0950  Gross per 24 hour  Intake   1430 ml  Output      0 ml  Net   1430 ml   Filed Weights   04/09/15 0500 04/10/15 0401 04/11/15 0603  Weight: 90 kg (198 lb 6.6 oz) 86.6 kg (190 lb 14.7 oz) 87.1 kg (192 lb 0.3 oz)     Exam:  General exam: pleasant elderly female lying comfortably propped up in bed. Respiratory system: Clear. No increased work of breathing. Cardiovascular system: S1 & S2 heard, RRR. No JVD, murmurs, gallops, clicks or pedal edema. Gastrointestinal system: Abdomen is nondistended, soft and nontender. Normal bowel sounds heard. Laparoscopy site dressings clean and dry. Central nervous system: Alert and oriented. No focal neurological deficits. Extremities: Symmetric  5 x 5 power.   Data Reviewed: Basic Metabolic Panel:  Recent Labs Lab 04/07/15 2225 04/08/15 0550 04/09/15 0520  04/10/15 0541 04/11/15 0620  NA 138 141 141 139 140  K 3.8 3.7 3.3* 3.5 3.9  CL 100* 107 105 105 106  CO2 30 29 27 27 26   GLUCOSE 196* 130* 106* 115* 128*  BUN 19 16 13 10 8   CREATININE 0.87 0.75 0.81 0.81 0.77  CALCIUM 9.7 9.0 9.1 9.3 9.5   Liver Function Tests:  Recent Labs Lab 04/07/15 2225 04/08/15 0550 04/09/15 0520 04/10/15 0541 04/11/15 0620  AST 375* 158* 64* 36 23  ALT 216* 154* 98* 67* 55*  ALKPHOS 130* 95 81 73 75  BILITOT 1.4* 0.5 0.6 1.0 0.8  PROT 7.4 6.1* 6.2* 6.2* 7.4  ALBUMIN 3.8 3.1* 3.0* 3.0* 3.2*    Recent Labs Lab 04/07/15 2225 04/08/15 0550 04/09/15 0520 04/10/15 0541 04/11/15 0620  LIPASE 2374* 1645* 653* 211* 141*   No results for input(s): AMMONIA in the last 168 hours. CBC:  Recent Labs Lab 04/07/15 2225 04/08/15 0550 04/09/15 0520 04/10/15 0541 04/11/15 0620  WBC 15.6* 11.2* 8.0 8.7 9.2  HGB 14.2 12.4 12.5 13.0 14.3  HCT 44.5 39.1 40.0 42.2 45.4  MCV 94.9 95.1 95.5 95.7 94.8  PLT 161 142* 139* 145* 154   Cardiac Enzymes: No results for input(s): CKTOTAL, CKMB, CKMBINDEX, TROPONINI in the last 168 hours. BNP (last 3 results) No results for input(s): PROBNP in the last 8760 hours. CBG:  Recent Labs Lab 04/10/15 1159 04/10/15 1648 04/10/15 2033 04/11/15 0003 04/11/15 0403  GLUCAP 166* 187* 97 107* 138*    Recent Results (from the past 240 hour(s))  Surgical pcr screen     Status: None   Collection Time: 04/11/15  5:44 AM  Result Value Ref Range Status   MRSA, PCR NEGATIVE NEGATIVE Final   Staphylococcus aureus NEGATIVE NEGATIVE Final    Comment:        The Xpert SA Assay (FDA approved for NASAL specimens in patients over 59 years of age), is one component of a comprehensive surveillance program.  Test performance has been validated by [facility] for patients greater than or equal to 68 year old. It is not intended to diagnose infection nor to guide or monitor treatment.            Studies: Dg  Cholangiogram Operative  04/11/2015   CLINICAL DATA:  Cholecystitis, intraoperative cholangiography, history hypertension, diabetes mellitus, CHF  EXAM: INTRAOPERATIVE CHOLANGIOGRAM  TECHNIQUE: Cholangiographic images from the C-arm fluoroscopic device were submitted for interpretation post-operatively. Please see the procedural report for the amount of contrast and the fluoroscopy time utilized.  COMPARISON:  Ultrasound abdomen 04/07/2015  FLUOROSCOPY TIME:  0 minutes 32 seconds  Images:  Single image plus a cine series of 140 frames.  FINDINGS: With contrast injection into the cystic duct stump, a markedly dilated cystic duct, common bile duct and common hepatic duct are identified.  Free spillage of contrast into the duodenum is identified.  Small amount of reflux of contrast into the distal pancreatic duct is seen.  Intrahepatic biliary radicles do not opacify during this exam.  No definite mass or stricture identified.  Single tiny questionable filling defect versus artifact noted at distal CBD at ampulla.  IMPRESSION: Significant extrahepatic biliary dilatation though there is free spillage of contrast into the duodenum indicating CBD patency.  Question tiny filling defect in distal CBD at ampulla versus artifact, tiny distal CBD stone  not excluded.   Electronically Signed   By: Lavonia Dana M.D.   On: 04/11/2015 09:58        Scheduled Meds: . bisoprolol  5 mg Oral Daily  . fentaNYL      . insulin aspart  0-9 Units Subcutaneous Q4H  . lip balm  1 application Topical BID  . piperacillin-tazobactam (ZOSYN)  IV  3.375 g Intravenous Q8H  . polyethylene glycol  17 g Oral BID  . saccharomyces boulardii  250 mg Oral BID  . sodium chloride  3 mL Intravenous Q12H   Continuous Infusions: . lactated ringers 125 mL/hr at 04/11/15 1104    Principal Problem:   Acute gallstone pancreatitis s/p lap chole w IOC 04/11/2015 Active Problems:   Essential hypertension   Dyslipidemia   Diabetes mellitus type  2, controlled   Gallstone pancreatitis   Mitral regurgitation   Chronic diastolic CHF (congestive heart failure)   Pre-operative cardiovascular examination, high risk surgery   Choledocholithiasis with chronic cholecystitis, nonobstructing    Time spent: 15 minutes.    Vernell Leep, MD, FACP, FHM. [facility] Pager [phone]  If 7PM-7AM, please contact night-coverage [email or web address] Password [email or web address] 04/11/2015, 1:07 PM    LOS: 3 days

## 2015-04-11 NOTE — Progress Notes (Signed)
General Surgery Note  LOS: 3 days  POD -  Day of Surgery  Assessment/Plan: 1.  Gall stone pancreatitis  US - 04/07/2015 - 1. Distended gallbladder with cholelithiasis, 8 mm stone in the neck. No secondary findings of cholecystitis.  2. Distended common bile duct, up to 13.8 mm. However, this appears unchanged on comparison to prior CT of 2013.  Zosyn - 8/25 >>>  Lipase - 211 - 04/10/2015   Plan lap chole:  The anatomy & physiology of hepatobiliary & pancreatic function was discussed.  The pathophysiology of gallbladder dysfunction was discussed.  Natural history risks without surgery was discussed.   I feel the risks of no intervention will lead to serious problems that outweigh the operative risks; therefore, I recommended cholecystectomy to remove the pathology.  I explained laparoscopic techniques with possible need for an open approach.  Probable cholangiogram to evaluate the bilary tract was explained as well.    Risks such as bleeding, infection, abscess, leak, injury to other organs, need for further treatment, stroke, heart attack, death, and other risks were discussed.  I noted a good likelihood this will help address the problem.  Possibility that this will not correct all abdominal symptoms was explained.  Goals of post-operative recovery were discussed as well.  We will work to minimize complications.  An educational handout further explaining the pathology and treatment options was given as well.  Questions were answered.  The patient expresses understanding & wishes to proceed with surgery.   2.  Cholelithiasis 3.  HTN 4.  DM, type 2 5.  History of CHF  Seen by Dr. Liane Comber  Moderate mitral regurgitation 6   Minimal thrombocytopenia - plts - 145,000 - 04/10/2015  7.  DVT prophylaxis - on hold 8.  On isolation for ESBL   Principal Problem:   Acute gallstone pancreatitis Active Problems:   Essential hypertension   Dyslipidemia   Diabetes mellitus type 2, controlled  Gallstone pancreatitis   Mitral regurgitation   Chronic diastolic CHF (congestive heart failure)   Pre-operative cardiovascular examination, high risk surgery  Subjective:  Doing well.  Essentially no pain.  I spoke to the daughter, Heidi Dominguez [phone]), about the surgery.  I reviewed with her all the findings.  The family understands the risks and potential benefits. Objective:   Filed Vitals:   04/11/15 0603  BP: 185/68  Pulse: 74  Temp: 98.2 F (36.8 C)  Resp: 18     Intake/Output from previous day:  08/27 0701 - 08/28 0700 In: 1200 [P.O.:1200] Out: -   Intake/Output this shift:      Physical Exam:  General: Pt awake/alert/oriented x4 in no major acute distress Eyes: PERRL, normal EOM. Sclera nonicteric Neuro: CN II-XII intact w/o focal sensory/motor deficits. Lymph: No head/neck/groin lymphadenopathy Psych:  No delerium/psychosis/paranoia HENT: Normocephalic, Mucus membranes moist.  No thrush Neck: Supple, No tracheal deviation Chest: No pain.  Good respiratory excursion. CV:  Pulses intact.  Regular rhythm MS: Normal AROM mjr joints.  No obvious deformity Abdomen: Soft, Nondistended.  Nontender.  No incarcerated hernias. Ext:  SCDs BLE.  No significant edema.  No cyanosis Skin: No petechiae / purpura    Lab Results:     Recent Labs  04/10/15 0541 04/11/15 0620  WBC 8.7 9.2  HGB 13.0 14.3  HCT 42.2 45.4  PLT 145* 154    BMET    Recent Labs  04/09/15 0520 04/10/15 0541  NA 141 139  K 3.3* 3.5  CL 105 105  CO2 27 27  GLUCOSE 106* 115*  BUN 13 10  CREATININE 0.81 0.81  CALCIUM 9.1 9.3    PT/INR  No results for input(s): LABPROT, INR in the last 72 hours.  ABG  No results for input(s): PHART, HCO3 in the last 72 hours.  Invalid input(s): PCO2, PO2   Studies/Results:  No results found.   Anti-infectives:   Anti-infectives    Start     Dose/Rate Route Frequency Ordered Stop   04/08/15 1400  piperacillin-tazobactam (ZOSYN) IVPB 3.375  g     3.375 g 12.5 mL/hr over 240 Minutes Intravenous Every 8 hours 04/08/15 0426     04/08/15 0400  piperacillin-tazobactam (ZOSYN) IVPB 3.375 g     3.375 g 100 mL/hr over 30 Minutes Intravenous  Once 04/08/15 0353 04/08/15 0458      Adin Hector, M.D., F.A.C.S. Gastrointestinal and Minimally Invasive Surgery [facility] [address] [place], [place] [address] [phone] Main / Paging    04/11/2015

## 2015-04-11 NOTE — Anesthesia Postprocedure Evaluation (Signed)
  Anesthesia Post-op Note  Patient: Heidi Dominguez  Procedure(s) Performed: Procedure(s) (LRB): LAPAROSCOPIC LYSIS OF ADHESIONS, LAPAROSCOPIC CHOLECYSTECTOMY WITH INTRAOPERATIVE CHOLANGIOGRAM (N/A)  Patient Location: PACU  Anesthesia Type: General  Level of Consciousness: awake and alert   Airway and Oxygen Therapy: Patient Spontanous Breathing  Post-op Pain: mild  Post-op Assessment: Post-op Vital signs reviewed, Patient's Cardiovascular Status Stable, Respiratory Function Stable, Patent Airway and No signs of Nausea or vomiting  Last Vitals:  Filed Vitals:   04/11/15 1043  BP: 184/82  Pulse: 69  Temp: 36.9 C  Resp:     Post-op Vital Signs: stable   Complications: No apparent anesthesia complications

## 2015-04-11 NOTE — Anesthesia Procedure Notes (Signed)
Procedure Name: Intubation Date/Time: 04/11/2015 7:49 AM Performed by: Noralyn Pick D Pre-anesthesia Checklist: Patient identified, Emergency Drugs available, Suction available and Patient being monitored Patient Re-evaluated:Patient Re-evaluated prior to inductionOxygen Delivery Method: Circle System Utilized Preoxygenation: Pre-oxygenation with 100% oxygen Intubation Type: IV induction Ventilation: Mask ventilation without difficulty Laryngoscope Size: Mac and 3 Grade View: Grade II Tube type: Oral Number of attempts: 1 Airway Equipment and Method: Stylet and Oral airway Placement Confirmation: ETT inserted through vocal cords under direct vision,  positive ETCO2 and breath sounds checked- equal and bilateral Secured at: 21 cm Tube secured with: Tape Dental Injury: Teeth and Oropharynx as per pre-operative assessment

## 2015-04-11 NOTE — Anesthesia Preprocedure Evaluation (Addendum)
Anesthesia Evaluation  Patient identified by MRN, date of birth, ID band Patient awake    Reviewed: Allergy & Precautions, H&P , NPO status , Patient's Chart, lab work & pertinent test results  History of Anesthesia Complications (+) PROLONGED EMERGENCE  Airway Mallampati: II  TM Distance: >3 FB Neck ROM: full    Dental  (+) Dental Advisory Given, Edentulous Upper, Edentulous Lower   Pulmonary shortness of breath and with exertion,  breath sounds clear to auscultation  Pulmonary exam normal       Cardiovascular hypertension, Pt. on medications +CHF Normal cardiovascular examRhythm:regular Rate:Normal  Bradycardia. Chronic diastolic CHF   Neuro/Psych negative neurological ROS  negative psych ROS   GI/Hepatic negative GI ROS, Neg liver ROS, GERD-  Medicated and Controlled,Acute gallstone pancreatitis   Endo/Other  diabetes, Well Controlled, Type 2, Oral Hypoglycemic Agents  Renal/GU negative Renal ROS  negative genitourinary   Musculoskeletal   Abdominal   Peds  Hematology negative hematology ROS (+)   Anesthesia Other Findings   Reproductive/Obstetrics negative OB ROS                            Anesthesia Physical Anesthesia Plan  ASA: III  Anesthesia Plan: General   Post-op Pain Management:    Induction: Intravenous  Airway Management Planned: Oral ETT  Additional Equipment:   Intra-op Plan:   Post-operative Plan: Extubation in OR  Informed Consent: I have reviewed the patients History and Physical, chart, labs and discussed the procedure including the risks, benefits and alternatives for the proposed anesthesia with the patient or authorized representative who has indicated his/her understanding and acceptance.   Dental Advisory Given  Plan Discussed with: CRNA and Surgeon  Anesthesia Plan Comments:         Anesthesia Quick Evaluation

## 2015-04-12 ENCOUNTER — Encounter (HOSPITAL_COMMUNITY): Payer: Self-pay | Admitting: Surgery

## 2015-04-12 LAB — COMPREHENSIVE METABOLIC PANEL
ALBUMIN: 2.7 g/dL — AB (ref 3.5–5.0)
ALT: 75 U/L — ABNORMAL HIGH (ref 14–54)
ANION GAP: 5 (ref 5–15)
AST: 70 U/L — ABNORMAL HIGH (ref 15–41)
Alkaline Phosphatase: 88 U/L (ref 38–126)
BUN: 7 mg/dL (ref 6–20)
CHLORIDE: 102 mmol/L (ref 101–111)
CO2: 30 mmol/L (ref 22–32)
Calcium: 9 mg/dL (ref 8.9–10.3)
Creatinine, Ser: 0.68 mg/dL (ref 0.44–1.00)
GFR calc non Af Amer: >60 mL/min (ref >60)
GLUCOSE: 136 mg/dL — AB (ref 65–99)
POTASSIUM: 4.1 mmol/L (ref 3.5–5.1)
SODIUM: 137 mmol/L (ref 135–145)
Total Bilirubin: 0.9 mg/dL (ref 0.3–1.2)
Total Protein: 6.1 g/dL — ABNORMAL LOW (ref 6.5–8.1)

## 2015-04-12 LAB — GLUCOSE, CAPILLARY
GLUCOSE-CAPILLARY: 187 mg/dL — AB (ref 65–99)
GLUCOSE-CAPILLARY: 139 mg/dL — AB (ref 65–99)
GLUCOSE-CAPILLARY: 205 mg/dL — AB (ref 65–99)
GLUCOSE-CAPILLARY: 162 mg/dL — AB (ref 65–99)
Glucose-Capillary: 92 mg/dL (ref 65–99)
Glucose-Capillary: 94 mg/dL (ref 65–99)
Glucose-Capillary: 112 mg/dL — ABNORMAL HIGH (ref 65–99)
Glucose-Capillary: 150 mg/dL — ABNORMAL HIGH (ref 65–99)

## 2015-04-12 LAB — CBC
HCT: 42 % (ref 36.0–46.0)
HEMOGLOBIN: 13.5 g/dL (ref 12.0–15.0)
MCH: 30.6 pg (ref 26.0–34.0)
MCHC: 32.1 g/dL (ref 30.0–36.0)
MCV: 95.2 fL (ref 78.0–100.0)
PLATELETS: 149 10*3/uL — AB (ref 150–400)
RBC: 4.41 MIL/uL (ref 3.87–5.11)
RDW: 13.9 % (ref 11.5–15.5)
WBC: 7.8 10*3/uL (ref 4.0–10.5)

## 2015-04-12 LAB — MAGNESIUM: MAGNESIUM: 1.7 mg/dL (ref 1.7–2.4)

## 2015-04-12 LAB — LIPASE, BLOOD: Lipase: 46 U/L (ref 22–51)

## 2015-04-12 MED ORDER — HYDRALAZINE HCL 10 MG PO TABS
10.0000 mg | ORAL_TABLET | Freq: Three times a day (TID) | ORAL | Status: DC
  Administered 2015-04-12: 10 mg via ORAL
  Filled 2015-04-12 (×4): qty 1

## 2015-04-12 MED ORDER — MAGNESIUM SULFATE 2 GM/50ML IV SOLN
2.0000 g | Freq: Once | INTRAVENOUS | Status: AC
Start: 2015-04-12 — End: 2015-04-12
  Administered 2015-04-12: 2 g via INTRAVENOUS
  Filled 2015-04-12: qty 50

## 2015-04-12 NOTE — Care Management Note (Signed)
Case Management Note  Patient Details  Name: KAILEEN BRONKEMA MRN: [id number] Date of Birth: 1921-07-05  Subjective/Objective:  Spoke to attending-now patient is not for d/c. I have recc to remove d/c order since it means for case mgnt to discuss d/c plans.  Await patient's son Toula Moos to come to hospital.Nsg notified to contact me when he arrives.                 Action/Plan:   Expected Discharge Date:                  Expected Discharge Plan:  Skilled Nursing Facility  In-House Referral:  NA, Clinical Social Work  Discharge planning Services  CM Consult  Post Acute Care Choice:  NA Choice offered to:  NA  DME Arranged:    DME Agency:     HH Arranged:    [place] Agency:     Status of Service:  In process, will continue to follow  Medicare Important Message Given:  Yes-second notification given Date Medicare IM Given:    Medicare IM give by:    Date Additional Medicare IM Given:    Additional Medicare Important Message give by:     If discussed at [place] of Stay Meetings, dates discussed:    Additional Comments:  Dessa Phi, RN 04/12/2015, 1:21 PM

## 2015-04-12 NOTE — Clinical Social Work Placement (Signed)
   CLINICAL SOCIAL WORK PLACEMENT  NOTE  Date:  04/12/2015  Patient Details  Name: Heidi Dominguez MRN: [id number] Date of Birth: 12-Dec-1920  Clinical Social Work is seeking post-discharge placement for this patient at the [place] level of care (*CSW will initial, date and re-position this form in  chart as items are completed):  Yes   Patient/family provided with [place] Work Department's list of facilities offering this level of care within the geographic area requested by the patient (or if unable, by the patient's family).  Yes   Patient/family informed of their freedom to choose among providers that offer the needed level of care, that participate in Medicare, Medicaid or managed care program needed by the patient, have an available bed and are willing to accept the patient.  Yes   Patient/family informed of [facility]'s ownership interest in [facility] and [facility], as well as of the fact that they are under no obligation to receive care at these facilities.  PASRR submitted to EDS on       PASRR number received on       Existing PASRR number confirmed on 04/12/15     FL2 transmitted to all facilities in geographic area requested by pt/family on 04/12/15     FL2 transmitted to all facilities within larger geographic area on       Patient informed that his/her managed care company has contracts with or will negotiate with certain facilities, including the following:            Patient/family informed of bed offers received.  Patient chooses bed at       Physician recommends and patient chooses bed at      Patient to be transferred to   on  .  Patient to be transferred to facility by       Patient family notified on   of transfer.  Name of family member notified:        PHYSICIAN       Additional Comment:    _______________________________________________ Boone Master, [place] 04/12/2015, 3:33 PM

## 2015-04-12 NOTE — Clinical Social Work Note (Addendum)
Clinical Social Work Assessment  Patient Details  Name: Heidi Dominguez MRN: [id number] Date of Birth: Feb 08, 1921  Date of referral:  04/12/15               Reason for consult:  Discharge Planning                Permission sought to share information with:  Family Supports Permission granted to share information::  Yes, Verbal Permission Granted  Name::     Sales executive::     Relationship::  son  Contact Information:     Housing/Transportation Living arrangements for the past 2 months:  Apartment Source of Information:  Patient, Adult Children Patient Interpreter Needed:  None Criminal Activity/Legal Involvement Pertinent to Current Situation/Hospitalization:  No - Comment as needed Significant Relationships:  Adult Children Lives with:  Self Do you feel safe going back to the place where you live?  Yes Need for family participation in patient care:  Yes (Comment)  Care giving concerns:  PT recommends SNF vs 24 hour supervision. Family is unable to provide 24 hour care so patient and family interested in SNF placement.   Social Worker assessment / plan:  CSW met with patient and son at bedside. CSW introduced myself and explained role. Patient currently lives at home and has supportive son and dtr. Patient reports she has been to SNF in the past and due to deconditioning, patient is agreeable to SNF placement again. Son in room and dtr via phone aware and agreeable to plans. Patient has been to [facility] in the past and would prefer to return to [facility] if possible.  Son reports he has spoken to medical team and is aware of anticipated DC tomorrow. Son or dtr would like to be present when PTAR arrives to ease patient's nerves.  CSW explained SNF process and left SNF list with patient. CSW completed FL2 and spoke with [facility].  CSW will continue to follow.  Employment status:  Retired Forensic scientist:  [employer] PT Recommendations:  Research officer, trade union,  [place] / Referral to community resources:  [place]  Patient/Family's Response to care:  Patient engaged during assessment but wants her children to be involved throughout process as well. Son reports he and sister were overwhelmed this morning but feels better now that plan is in place.  Patient/Family's Understanding of and Emotional Response to Diagnosis, Current Treatment, and Prognosis:  Patient has been to SNF in the past and aware of plans. Patient reports she is motivated to work well with PT at SNF so that she can DC back home. Patient reports she is independent and active for a lady her age and enjoys living at her house.  Emotional Assessment Appearance:  Appears younger than stated age Attitude/Demeanor/Rapport:  Other (Cooperative) Affect (typically observed):  Appropriate Orientation:  Oriented to Self, Oriented to Place, Oriented to  Time, Oriented to Situation Alcohol / Substance use:  Not Applicable Psych involvement (Current and /or in the community):  No (Comment)  Discharge Needs  Concerns to be addressed:  No discharge needs identified Readmission within the last 30 days:  No Current discharge risk:  None Barriers to Discharge:  No Barriers Identified   Boone Master, [place] 04/12/2015, 3:29 PM [phone]

## 2015-04-12 NOTE — Care Management Note (Signed)
Case Management Note  Patient Details  Name: Heidi Dominguez MRN: [id number] Date of Birth: 04/19/1921  Subjective/Objective:   Received call from Nsg/MD about d/c order.Noted PT cons placed. Waited to talk to patient after PT eval.  Noted PT recc SNF, mas asst w/rw. Before I can talk to patient about d/c plan  will await CSW outcome conversation after offering SNF since there is no documentation of SNF being offered to patient.Noted patient has 24hr caregiver service. Also received call from attending of d/c, & wanting to determine d/c plan.  Explained that once CSW has offered SNF, & I know the outcome then I can discuss other options if needed.Nsg notified.                Action/Plan:   Expected Discharge Date:                  Expected Discharge Plan:  Skilled Nursing Facility  In-House Referral:  NA, Clinical Social Work  Discharge planning Services  CM Consult  Post Acute Care Choice:  NA Choice offered to:  NA  DME Arranged:    DME Agency:     HH Arranged:    [place] Agency:     Status of Service:  In process, will continue to follow  Medicare Important Message Given:  Yes-second notification given Date Medicare IM Given:    Medicare IM give by:    Date Additional Medicare IM Given:    Additional Medicare Important Message give by:     If discussed at [place] of Stay Meetings, dates discussed:    Additional Comments:  Dessa Phi, RN 04/12/2015, 12:40 PM

## 2015-04-12 NOTE — Progress Notes (Addendum)
PROGRESS NOTE    Heidi Dominguez ERD:[id number] DOB: [birth year] DOA: 04/07/2015 PCP: Mathews Argyle, MD  HPI/Brief narrative 81 year old female patient with history of DM 2, HTN, chronic diastolic CHF, HLD, GERD, chronic wheezing/dyspnea (recently saw pulmonology), presented to the hospital with abdominal pain and diagnosed with acute gallstone pancreatitis. Surgery have consulted and are considering cholecystectomy once pancreatitis resolves and have requested cardiology and pulmonary consultation for preop risk assessment.   Assessment/Plan:  Acute gallstone pancreatitis - Treatedsupportively with bowel rest, IV fluids and pain management. - Clinically improved. Lipase has decreased from 2374 > 1645 >653>211>141. Transaminases have nearly normalized - Status post laparoscopic cholecystectomy 8/28. - Pancreatitis clinically resolved.  Symptomatic cholelithiasis with chronic cholecystitis & nonobstructive choledocholithiasis, s/p laparoscopic cholecystectomy - Ultrasound abdomen confirms distended gallbladder with gallstones and a 8 mm stone at the neck of the gallbladder. No secondary findings of cholecystitis. CBD dilated up to 13.8 mm but stable compared to CT 2013. - After preop clearance by cardiology and pulmonology, patient underwent laparoscopic cholecystectomy on 8/28. - Seen this morning. Pain controlled. Voiding and passing flatus. Tolerated liquids and diet being advanced to soft diet by surgery. Discussed with surgery and then with patient with plans for possible discharge later today if she tolerated soft diet. Subsequently patient's daughter called and she was upset and concerned that patient was being discharged too soon and expressed that she hoped patient would have at least overnight observation to make sure that she tolerates diet before discharge in a.m-especially since there was not adequate care available at home today. Informed daughter that we will monitor her  overnight and will likely be discharged in a.m. Then patient apparently told nursing that she wouldn't have 24/7 care at home until Wednesday. PT recommended SNF and patient planning to DC to SNF.  Essential hypertension - Mildly uncontrolled. Started bisoprolol 8/26. Will add oral hydralazine When necessary IV hydralazine.  Type II DM, controlled - On NovoLog SSI. - As per PCP, well controlled based on recent A1c in May in the low 6 range.  HLD - Lipid panel unremarkable  Chronic diastolic CHF - Compensated  Dyspnea/wheezing - Requested pulmonary consultation for assessment, management and preop risk evaluation. - As per outpatient pulmonary evaluation, switch from ACEI to ARB (done with mild improvement) with next step being switching to selective beta blocker (Bystolic/Bisoprolol) - IV metoprolol-changed to oral bisoprolol - No wheezing.  Hypokalemia - Replace and follow as needed.  Thrombocytopenia -Resolved   DVT prophylaxis: SCDs Code Status: Full Family Communication: Discussed extensively with daughter on 04/12/15  Disposition Plan: DC to SNF possibly 8/30   Consultants:  General surgery  Cardiology  Pulmonology  Procedures:  Laparoscopic lysis of adhesions, cholecystectomy with IOC on 8/28  Antibiotics:  IV Zosyn   Subjective: Pain controlled. Voiding urine and passing flatus. Tolerated liquids and diet being advanced to soft diet by surgery.   Objective: Filed Vitals:   04/11/15 1015 04/11/15 1043 04/11/15 2158 04/12/15 0607  BP: 183/67 184/82 159/61 158/65  Pulse: 67 69 77 74  Temp:  98.4 F (36.9 C) 98.7 F (37.1 C) 98.4 F (36.9 C)  TempSrc:  Oral Oral Oral  Resp: 14  18 18   Height:      Weight:    90 kg (198 lb 6.6 oz)  SpO2: 100% 100% 96% 97%    Intake/Output Summary (Last 24 hours) at 04/12/15 5631 Last data filed at 04/12/15 0618  Gross per 24 hour  Intake 4074.17 ml  Output  0 ml  Net 4074.17 ml   Filed Weights    04/10/15 0401 04/11/15 0603 04/12/15 0607  Weight: 86.6 kg (190 lb 14.7 oz) 87.1 kg (192 lb 0.3 oz) 90 kg (198 lb 6.6 oz)     Exam:  General exam: pleasant elderly female lying comfortably propped up in bed. Respiratory system: Clear. No increased work of breathing. Cardiovascular system: S1 & S2 heard, RRR. No JVD, murmurs, gallops, clicks or pedal edema. Gastrointestinal system: Abdomen is nondistended, soft and nontender. Normal bowel sounds heard. Laparoscopy site dressings clean and dry. Central nervous system: Alert and oriented. No focal neurological deficits. Extremities: Symmetric 5 x 5 power.   Data Reviewed: Basic Metabolic Panel:  Recent Labs Lab 04/08/15 0550 04/09/15 0520 04/10/15 0541 04/11/15 0620 04/12/15 0504  NA 141 141 139 140 137  K 3.7 3.3* 3.5 3.9 4.1  CL 107 105 105 106 102  CO2 29 27 27 26 30   GLUCOSE 130* 106* 115* 128* 136*  BUN 16 13 10 8 7   CREATININE 0.75 0.81 0.81 0.77 0.68  CALCIUM 9.0 9.1 9.3 9.5 9.0  MG  --   --   --   --  1.7   Liver Function Tests:  Recent Labs Lab 04/08/15 0550 04/09/15 0520 04/10/15 0541 04/11/15 0620 04/12/15 0504  AST 158* 64* 36 23 70*  ALT 154* 98* 67* 55* 75*  ALKPHOS 95 81 73 75 88  BILITOT 0.5 0.6 1.0 0.8 0.9  PROT 6.1* 6.2* 6.2* 7.4 6.1*  ALBUMIN 3.1* 3.0* 3.0* 3.2* 2.7*    Recent Labs Lab 04/08/15 0550 04/09/15 0520 04/10/15 0541 04/11/15 0620 04/12/15 0504  LIPASE 1645* 653* 211* 141* 46   No results for input(s): AMMONIA in the last 168 hours. CBC:  Recent Labs Lab 04/08/15 0550 04/09/15 0520 04/10/15 0541 04/11/15 0620 04/12/15 0504  WBC 11.2* 8.0 8.7 9.2 7.8  HGB 12.4 12.5 13.0 14.3 13.5  HCT 39.1 40.0 42.2 45.4 42.0  MCV 95.1 95.5 95.7 94.8 95.2  PLT 142* 139* 145* 154 149*   Cardiac Enzymes: No results for input(s): CKTOTAL, CKMB, CKMBINDEX, TROPONINI in the last 168 hours. BNP (last 3 results) No results for input(s): PROBNP in the last 8760 hours. CBG:  Recent  Labs Lab 04/10/15 2033 04/11/15 0003 04/11/15 0403 04/11/15 1731 04/11/15 2155  GLUCAP 97 107* 138* 224* 150*    Recent Results (from the past 240 hour(s))  Surgical pcr screen     Status: None   Collection Time: 04/11/15  5:44 AM  Result Value Ref Range Status   MRSA, PCR NEGATIVE NEGATIVE Final   Staphylococcus aureus NEGATIVE NEGATIVE Final    Comment:        The Xpert SA Assay (FDA approved for NASAL specimens in patients over 40 years of age), is one component of a comprehensive surveillance program.  Test performance has been validated by [facility] for patients greater than or equal to 15 year old. It is not intended to diagnose infection nor to guide or monitor treatment.            Studies: Dg Cholangiogram Operative  04/11/2015   CLINICAL DATA:  Cholecystitis, intraoperative cholangiography, history hypertension, diabetes mellitus, CHF  EXAM: INTRAOPERATIVE CHOLANGIOGRAM  TECHNIQUE: Cholangiographic images from the C-arm fluoroscopic device were submitted for interpretation post-operatively. Please see the procedural report for the amount of contrast and the fluoroscopy time utilized.  COMPARISON:  Ultrasound abdomen 04/07/2015  FLUOROSCOPY TIME:  0 minutes 32 seconds  Images:  Single image plus a cine series of 140 frames.  FINDINGS: With contrast injection into the cystic duct stump, a markedly dilated cystic duct, common bile duct and common hepatic duct are identified.  Free spillage of contrast into the duodenum is identified.  Small amount of reflux of contrast into the distal pancreatic duct is seen.  Intrahepatic biliary radicles do not opacify during this exam.  No definite mass or stricture identified.  Single tiny questionable filling defect versus artifact noted at distal CBD at ampulla.  IMPRESSION: Significant extrahepatic biliary dilatation though there is free spillage of contrast into the duodenum indicating CBD patency.  Question tiny filling defect  in distal CBD at ampulla versus artifact, tiny distal CBD stone not excluded.   Electronically Signed   By: Lavonia Dana M.D.   On: 04/11/2015 09:58        Scheduled Meds: . bisoprolol  5 mg Oral Daily  . insulin aspart  0-9 Units Subcutaneous TID WC  . lip balm  1 application Topical BID  . polyethylene glycol  17 g Oral BID  . saccharomyces boulardii  250 mg Oral BID  . sodium chloride  3 mL Intravenous Q12H   Continuous Infusions: . lactated ringers 125 mL/hr at 04/12/15 4888    Principal Problem:   Acute gallstone pancreatitis s/p lap chole w IOC 04/11/2015 Active Problems:   Essential hypertension   Dyslipidemia   Diabetes mellitus type 2, controlled   Gallstone pancreatitis   Mitral regurgitation   Chronic diastolic CHF (congestive heart failure)   Pre-operative cardiovascular examination, high risk surgery   Choledocholithiasis with chronic cholecystitis, nonobstructing    Time spent: 25 minutes.    Vernell Leep, MD, FACP, FHM. [facility] Pager [phone]  If 7PM-7AM, please contact night-coverage [email or web address] Password [email or web address] 04/12/2015, 7:22 AM    LOS: 4 days

## 2015-04-12 NOTE — Care Management Note (Signed)
Case Management Note  Patient Details  Name: GERARDO TERRITO MRN: [id number] Date of Birth: 1921/03/26  Subjective/Objective:CM/CSW spoke to patient/son in rm about PT recc SNF. Patient chose SNF.Has gone to [facility] in past.CSW diligently following.                    Action/Plan:d/c plan SNF.   Expected Discharge Date:                  Expected Discharge Plan:  Skilled Nursing Facility  In-House Referral:  NA, Clinical Social Work  Discharge planning Services  CM Consult  Post Acute Care Choice:  NA Choice offered to:  NA  DME Arranged:    DME Agency:     HH Arranged:    [place] Agency:     Status of Service:  In process, will continue to follow  Medicare Important Message Given:  Yes-second notification given Date Medicare IM Given:    Medicare IM give by:    Date Additional Medicare IM Given:    Additional Medicare Important Message give by:     If discussed at [place] of Stay Meetings, dates discussed:    Additional Comments:  Dessa Phi, RN 04/12/2015, 3:05 PM

## 2015-04-12 NOTE — Evaluation (Addendum)
Physical Therapy Evaluation Patient Details Name: Heidi Dominguez MRN: [id number] DOB: 1921-04-18 Today's Date: 04/12/2015   History of Present Illness  81 yo female s/p lap chole 04/11/15.   Clinical Impression  On eval, pt required Max assist to stand, Min assist for bed mobility and ambulation-walked ~50 feet with RW. Pt required significant assist to mobilize on today. Pt is at risk for falls, especially during sit to stand transitions. Discussed d/c plan-pt lives alone but has an aide 3x/week, or more as needed, per pt report. At this time, feel ST rehab may be needed prior to returning home alone, if pt is agreeable. If pt does not go SNF, highly recommend 24 hour supervision/assist at home with HHPT.     Follow Up Recommendations SNF;Supervision/Assistance - 24 hour    Equipment Recommendations  Rolling walker with 5" wheels (pt states she needs a new rollator)    Recommendations for Other Services OT consult     Precautions / Restrictions Precautions Precautions: Fall Precaution Comments: abdominal surgery Restrictions Weight Bearing Restrictions: No      Mobility  Bed Mobility Overal bed mobility: Needs Assistance Bed Mobility: Supine to Sit     Supine to sit: Min assist;HOB elevated     General bed mobility comments: Assist for trunk. Increased time. Pain with bed mobility  Transfers Overall transfer level: Needs assistance Equipment used: Rolling walker (2 wheeled) Transfers: Sit to/from Stand Sit to Stand: Max assist;From elevated surface         General transfer comment: Assist to rise, stabilize, control descent. Max assist to rise from elevated surface. x2 attempts. Pt stood for ~1-2 minutes on 1st attempt before having to sit back down due to fatigue. Total assist to don depends. Significant risk for fall.   Ambulation/Gait Ambulation/Gait assistance: Min assist Ambulation Distance (Feet): 50 Feet Assistive device: Rolling walker (2 wheeled) Gait  Pattern/deviations: Step-through pattern;Decreased stride length;Trunk flexed     General Gait Details: Assist to stabilize pt. Followed with recliner. O2 sats 96% on RA after ambulation  Stairs            Wheelchair Mobility    Modified Rankin (Stroke Patients Only)       Balance Overall balance assessment: Needs assistance         Standing balance support: Bilateral upper extremity supported;During functional activity Standing balance-Leahy Scale: Poor                               Pertinent Vitals/Pain Pain Assessment: Faces Faces Pain Scale: Hurts even more Pain Location: abdomen with mobility Pain Descriptors / Indicators: Sore;Sharp Pain Intervention(s): Monitored during session;Repositioned    Home Living Family/patient expects to be discharged to:: Private residence   Available Help at Discharge: Personal care attendant (MWF) Type of Home: House Home Access: Ramped entrance     Home Layout: One level Home Equipment: Environmental consultant - 4 wheels;Walker - 2 wheels;Bedside commode      Prior Function Level of Independence: Needs assistance   Gait / Transfers Assistance Needed: uses rollator  ADL's / Homemaking Assistance Needed: assist with bathing/dressing. Pt sometimes prepares some meals        Hand Dominance        Extremity/Trunk Assessment   Upper Extremity Assessment: Generalized weakness           Lower Extremity Assessment: RLE deficits/detail;LLE deficits/detail RLE Deficits / Details: decreased knee flexion-flex to ~80 degrees EOB LLE Deficits /  Details: decreased knee flexion -flex to ~890 degrees EOB  Cervical / Trunk Assessment: Kyphotic  Communication   Communication: No difficulties  Cognition Arousal/Alertness: Awake/alert Behavior During Therapy: WFL for tasks assessed/performed Overall Cognitive Status: Within Functional Limits for tasks assessed                      General Comments      Exercises         Assessment/Plan    PT Assessment Patient needs continued PT services  PT Diagnosis Difficulty walking;Generalized weakness;Acute pain   PT Problem List Decreased strength;Decreased activity tolerance;Decreased range of motion;Decreased balance;Decreased mobility;Obesity;Decreased knowledge of use of DME;Pain  PT Treatment Interventions DME instruction;Gait training;Functional mobility training;Therapeutic activities;Patient/family education;Balance training;Therapeutic exercise   PT Goals (Current goals can be found in the Care Plan section) Acute Rehab PT Goals Patient Stated Goal: home PT Goal Formulation: With patient Time For Goal Achievement: 04/26/15 Potential to Achieve Goals: Good    Frequency Min 3X/week   Barriers to discharge Decreased caregiver support      Co-evaluation               End of Session Equipment Utilized During Treatment: Gait belt Activity Tolerance: Patient limited by fatigue;Patient limited by pain Patient left: in chair;with call bell/phone within reach           Time: 6244-6950 PT Time Calculation (min) (ACUTE ONLY): 33 min   Charges:   PT Evaluation $Initial PT Evaluation Tier I: 1 Procedure PT Treatments $Gait Training: 8-22 mins   PT G Codes:        Weston Anna, MPT Pager: [phone]

## 2015-04-12 NOTE — Care Management Note (Signed)
Case Management Note  Patient Details  Name: BRINAE WOODS MRN: [id number] Date of Birth: [birth year]  Subjective/Objective: Spoke to dtr [name] on phone about d/c.  She states that she has called medicare to appeal since her mother has been d/c within 2hrs of not knowing what she can eat. Explained to her that PT-recc SNF.  She stated that her moether does not have 24hr caregivers in the home.   Her mother can wash herself, & fix a meal.  The caregiver runs errands for her.  Patient's son Toula Moos is on his way to hospital to discuss d/c plans.  Notified Attending/NSg/CSW.                  Action/Plan:   Expected Discharge Date:                  Expected Discharge Plan:  Skilled Nursing Facility  In-House Referral:  NA, Clinical Social Work  Discharge planning Services  CM Consult  Post Acute Care Choice:  NA Choice offered to:  NA  DME Arranged:    DME Agency:     HH Arranged:    [place] Agency:     Status of Service:  In process, will continue to follow  Medicare Important Message Given:  Yes-second notification given Date Medicare IM Given:    Medicare IM give by:    Date Additional Medicare IM Given:    Additional Medicare Important Message give by:     If discussed at [place] of Stay Meetings, dates discussed:    Additional Comments:  Dessa Phi, RN 04/12/2015, 1:08 PM

## 2015-04-12 NOTE — Progress Notes (Signed)
Patient ID: Heidi Dominguez, female   DOB: 1921-05-18, 81 y.o.   MRN: [id number]     [facility]      [address] [place], [place] [address]    Phone: [phone] FAX: [phone]     Subjective: Sore.  Tolerating fulls.  Afebrile.  T bili is normal.  No n/v.  Has not ambulated.  Voiding.  No flatus.   Objective:  Vital signs:  Filed Vitals:   04/11/15 1015 04/11/15 1043 04/11/15 2158 04/12/15 0607  BP: 183/67 184/82 159/61 158/65  Pulse: 67 69 77 74  Temp:  98.4 F (36.9 C) 98.7 F (37.1 C) 98.4 F (36.9 C)  TempSrc:  Oral Oral Oral  Resp: 14  18 18   Height:      Weight:    90 kg (198 lb 6.6 oz)  SpO2: 100% 100% 96% 97%    Last BM Date: 04/10/15  Intake/Output   Yesterday:  08/28 0701 - 08/29 0700 In: 4161.7 [P.O.:720; I.V.:3441.7] Out: -  This shift:    I/O last 3 completed shifts: In: 4281.7 [P.O.:840; I.V.:3441.7] Out: -      Physical Exam: General: Pt awake/alert/oriented x4 in no acute distress Abdomen: Soft.  Nondistended.  Mildly tender at incisions only.  Umbilical dressing with blood, no active bleding. No evidence of peritonitis.  No incarcerated hernias.    Problem List:   Principal Problem:   Acute gallstone pancreatitis s/p lap chole w IOC 04/11/2015 Active Problems:   Essential hypertension   Dyslipidemia   Diabetes mellitus type 2, controlled   Gallstone pancreatitis   Mitral regurgitation   Chronic diastolic CHF (congestive heart failure)   Pre-operative cardiovascular examination, high risk surgery   Choledocholithiasis with chronic cholecystitis, nonobstructing    Results:   Labs: Results for orders placed or performed during the hospital encounter of 04/07/15 (from the past 48 hour(s))  Glucose, capillary     Status: Abnormal   Collection Time: 04/10/15 11:59 AM  Result Value Ref Range   Glucose-Capillary 166 (H) 65 - 99 mg/dL  Glucose, capillary     Status: Abnormal    Collection Time: 04/10/15  4:48 PM  Result Value Ref Range   Glucose-Capillary 187 (H) 65 - 99 mg/dL  Glucose, capillary     Status: None   Collection Time: 04/10/15  8:33 PM  Result Value Ref Range   Glucose-Capillary 97 65 - 99 mg/dL  Glucose, capillary     Status: Abnormal   Collection Time: 04/11/15 12:03 AM  Result Value Ref Range   Glucose-Capillary 107 (H) 65 - 99 mg/dL  Glucose, capillary     Status: Abnormal   Collection Time: 04/11/15  4:03 AM  Result Value Ref Range   Glucose-Capillary 138 (H) 65 - 99 mg/dL  Surgical pcr screen     Status: None   Collection Time: 04/11/15  5:44 AM  Result Value Ref Range   MRSA, PCR NEGATIVE NEGATIVE   Staphylococcus aureus NEGATIVE NEGATIVE    Comment:        The Xpert SA Assay (FDA approved for NASAL specimens in patients over 91 years of age), is one component of a comprehensive surveillance program.  Test performance has been validated by [facility] for patients greater than or equal to 47 year old. It is not intended to diagnose infection nor to guide or monitor treatment.   Lipase, blood     Status: Abnormal   Collection Time: 04/11/15  6:20 AM  Result Value Ref Range   Lipase 141 (H) 22 - 51 U/L  CBC     Status: None   Collection Time: 04/11/15  6:20 AM  Result Value Ref Range   WBC 9.2 4.0 - 10.5 K/uL   RBC 4.79 3.87 - 5.11 MIL/uL   Hemoglobin 14.3 12.0 - 15.0 g/dL   HCT 45.4 36.0 - 46.0 %   MCV 94.8 78.0 - 100.0 fL   MCH 29.9 26.0 - 34.0 pg   MCHC 31.5 30.0 - 36.0 g/dL   RDW 13.8 11.5 - 15.5 %   Platelets 154 150 - 400 K/uL  Comprehensive metabolic panel     Status: Abnormal   Collection Time: 04/11/15  6:20 AM  Result Value Ref Range   Sodium 140 135 - 145 mmol/L   Potassium 3.9 3.5 - 5.1 mmol/L   Chloride 106 101 - 111 mmol/L   CO2 26 22 - 32 mmol/L   Glucose, Bld 128 (H) 65 - 99 mg/dL   BUN 8 6 - 20 mg/dL   Creatinine, Ser 0.77 0.44 - 1.00 mg/dL   Calcium 9.5 8.9 - 10.3 mg/dL   Total Protein 7.4 6.5  - 8.1 g/dL   Albumin 3.2 (L) 3.5 - 5.0 g/dL   AST 23 15 - 41 U/L   ALT 55 (H) 14 - 54 U/L   Alkaline Phosphatase 75 38 - 126 U/L   Total Bilirubin 0.8 0.3 - 1.2 mg/dL   GFR calc non Af Amer >60 >60 mL/min   GFR calc Af Amer >60 >60 mL/min    Comment: (NOTE) The eGFR has been calculated using the CKD EPI equation. This calculation has not been validated in all clinical situations. eGFR's persistently <60 mL/min signify possible Chronic Kidney Disease.    Anion gap 8 5 - 15  Glucose, capillary     Status: Abnormal   Collection Time: 04/11/15  5:31 PM  Result Value Ref Range   Glucose-Capillary 224 (H) 65 - 99 mg/dL   Comment 1 Notify RN    Comment 2 Document in Chart   Glucose, capillary     Status: Abnormal   Collection Time: 04/11/15  9:55 PM  Result Value Ref Range   Glucose-Capillary 150 (H) 65 - 99 mg/dL  CBC     Status: Abnormal   Collection Time: 04/12/15  5:04 AM  Result Value Ref Range   WBC 7.8 4.0 - 10.5 K/uL   RBC 4.41 3.87 - 5.11 MIL/uL   Hemoglobin 13.5 12.0 - 15.0 g/dL   HCT 42.0 36.0 - 46.0 %   MCV 95.2 78.0 - 100.0 fL   MCH 30.6 26.0 - 34.0 pg   MCHC 32.1 30.0 - 36.0 g/dL   RDW 13.9 11.5 - 15.5 %   Platelets 149 (L) 150 - 400 K/uL  Comprehensive metabolic panel     Status: Abnormal   Collection Time: 04/12/15  5:04 AM  Result Value Ref Range   Sodium 137 135 - 145 mmol/L   Potassium 4.1 3.5 - 5.1 mmol/L   Chloride 102 101 - 111 mmol/L   CO2 30 22 - 32 mmol/L   Glucose, Bld 136 (H) 65 - 99 mg/dL   BUN 7 6 - 20 mg/dL   Creatinine, Ser 0.68 0.44 - 1.00 mg/dL   Calcium 9.0 8.9 - 10.3 mg/dL   Total Protein 6.1 (L) 6.5 - 8.1 g/dL   Albumin 2.7 (L) 3.5 - 5.0 g/dL   AST 70 (H)  15 - 41 U/L   ALT 75 (H) 14 - 54 U/L   Alkaline Phosphatase 88 38 - 126 U/L   Total Bilirubin 0.9 0.3 - 1.2 mg/dL   GFR calc non Af Amer >60 >60 mL/min   GFR calc Af Amer >60 >60 mL/min    Comment: (NOTE) The eGFR has been calculated using the CKD EPI equation. This calculation  has not been validated in all clinical situations. eGFR's persistently <60 mL/min signify possible Chronic Kidney Disease.    Anion gap 5 5 - 15  Lipase, blood     Status: None   Collection Time: 04/12/15  5:04 AM  Result Value Ref Range   Lipase 46 22 - 51 U/L  Magnesium     Status: None   Collection Time: 04/12/15  5:04 AM  Result Value Ref Range   Magnesium 1.7 1.7 - 2.4 mg/dL  Glucose, capillary     Status: Abnormal   Collection Time: 04/12/15  7:37 AM  Result Value Ref Range   Glucose-Capillary 139 (H) 65 - 99 mg/dL    Imaging / Studies: Dg Cholangiogram Operative  04/11/2015   CLINICAL DATA:  Cholecystitis, intraoperative cholangiography, history hypertension, diabetes mellitus, CHF  EXAM: INTRAOPERATIVE CHOLANGIOGRAM  TECHNIQUE: Cholangiographic images from the C-arm fluoroscopic device were submitted for interpretation post-operatively. Please see the procedural report for the amount of contrast and the fluoroscopy time utilized.  COMPARISON:  Ultrasound abdomen 04/07/2015  FLUOROSCOPY TIME:  0 minutes 32 seconds  Images:  Single image plus a cine series of 140 frames.  FINDINGS: With contrast injection into the cystic duct stump, a markedly dilated cystic duct, common bile duct and common hepatic duct are identified.  Free spillage of contrast into the duodenum is identified.  Small amount of reflux of contrast into the distal pancreatic duct is seen.  Intrahepatic biliary radicles do not opacify during this exam.  No definite mass or stricture identified.  Single tiny questionable filling defect versus artifact noted at distal CBD at ampulla.  IMPRESSION: Significant extrahepatic biliary dilatation though there is free spillage of contrast into the duodenum indicating CBD patency.  Question tiny filling defect in distal CBD at ampulla versus artifact, tiny distal CBD stone not excluded.   Electronically Signed   By: Lavonia Dana M.D.   On: 04/11/2015 09:58    Medications / Allergies:   Scheduled Meds: . bisoprolol  5 mg Oral Daily  . insulin aspart  0-9 Units Subcutaneous TID WC  . lip balm  1 application Topical BID  . magnesium sulfate 1 - 4 g bolus IVPB  2 g Intravenous Once  . polyethylene glycol  17 g Oral BID  . saccharomyces boulardii  250 mg Oral BID  . sodium chloride  3 mL Intravenous Q12H   Continuous Infusions: . lactated ringers 125 mL/hr at 04/12/15 0618   PRN Meds:.sodium chloride, acetaminophen **OR** acetaminophen, alum & mag hydroxide-simeth, bisacodyl, bismuth subsalicylate, diphenhydrAMINE, fentaNYL (SUBLIMAZE) injection, fentaNYL (SUBLIMAZE) injection, hydrALAZINE, lactated ringers, levalbuterol, magic mouthwash, menthol-cetylpyridinium, ondansetron **OR** ondansetron (ZOFRAN) IV, phenol, polyethylene glycol, polyvinyl alcohol, promethazine, sodium chloride, traMADol  Antibiotics: Anti-infectives    Start     Dose/Rate Route Frequency Ordered Stop   04/11/15 1400  piperacillin-tazobactam (ZOSYN) IVPB 3.375 g     3.375 g 12.5 mL/hr over 240 Minutes Intravenous Every 8 hours 04/11/15 1047 04/11/15 1826   04/08/15 1400  piperacillin-tazobactam (ZOSYN) IVPB 3.375 g  Status:  Discontinued     3.375 g 12.5 mL/hr over  240 Minutes Intravenous Every 8 hours 04/08/15 0426 04/11/15 1047   04/08/15 0400  piperacillin-tazobactam (ZOSYN) IVPB 3.375 g     3.375 g 100 mL/hr over 30 Minutes Intravenous  Once 04/08/15 0353 04/08/15 0458        Assessment/Plan POD#1 laparoscopic cholecystectomy with IOC, LOA---Dr. Johney Maine -AST/ALT slightly increased today, but not unexpected, T bili is normal.  Advance diet, mobilize, pulm toilet(766m on IS) PT to eval. -Stable for DC from surgical standpoint.  -will need to follow up with Dr. GJohney Mainein 2-3 weeks.  Will need LFTs 1-2 days before her scheduled appt.   EErby Pian AB[facility] Pager [phone](7A-4:30P)   04/12/2015 9:11 AM

## 2015-04-13 DIAGNOSIS — R5381 Other malaise: Secondary | ICD-10-CM | POA: Diagnosis not present

## 2015-04-13 DIAGNOSIS — R278 Other lack of coordination: Secondary | ICD-10-CM | POA: Diagnosis not present

## 2015-04-13 DIAGNOSIS — Z9181 History of falling: Secondary | ICD-10-CM | POA: Diagnosis not present

## 2015-04-13 DIAGNOSIS — I129 Hypertensive chronic kidney disease with stage 1 through stage 4 chronic kidney disease, or unspecified chronic kidney disease: Secondary | ICD-10-CM | POA: Diagnosis not present

## 2015-04-13 DIAGNOSIS — Z48815 Encounter for surgical aftercare following surgery on the digestive system: Secondary | ICD-10-CM | POA: Diagnosis not present

## 2015-04-13 DIAGNOSIS — E119 Type 2 diabetes mellitus without complications: Secondary | ICD-10-CM | POA: Diagnosis not present

## 2015-04-13 DIAGNOSIS — I5032 Chronic diastolic (congestive) heart failure: Secondary | ICD-10-CM | POA: Diagnosis not present

## 2015-04-13 DIAGNOSIS — R0600 Dyspnea, unspecified: Secondary | ICD-10-CM | POA: Diagnosis not present

## 2015-04-13 DIAGNOSIS — N189 Chronic kidney disease, unspecified: Secondary | ICD-10-CM | POA: Diagnosis not present

## 2015-04-13 DIAGNOSIS — K915 Postcholecystectomy syndrome: Secondary | ICD-10-CM | POA: Diagnosis not present

## 2015-04-13 DIAGNOSIS — E43 Unspecified severe protein-calorie malnutrition: Secondary | ICD-10-CM | POA: Diagnosis not present

## 2015-04-13 DIAGNOSIS — R109 Unspecified abdominal pain: Secondary | ICD-10-CM | POA: Diagnosis not present

## 2015-04-13 DIAGNOSIS — M19019 Primary osteoarthritis, unspecified shoulder: Secondary | ICD-10-CM | POA: Diagnosis not present

## 2015-04-13 DIAGNOSIS — M6281 Muscle weakness (generalized): Secondary | ICD-10-CM | POA: Diagnosis not present

## 2015-04-13 DIAGNOSIS — E876 Hypokalemia: Secondary | ICD-10-CM | POA: Diagnosis not present

## 2015-04-13 DIAGNOSIS — K851 Biliary acute pancreatitis: Secondary | ICD-10-CM | POA: Diagnosis not present

## 2015-04-13 DIAGNOSIS — I10 Essential (primary) hypertension: Secondary | ICD-10-CM | POA: Diagnosis not present

## 2015-04-13 DIAGNOSIS — Z5189 Encounter for other specified aftercare: Secondary | ICD-10-CM | POA: Diagnosis not present

## 2015-04-13 DIAGNOSIS — R531 Weakness: Secondary | ICD-10-CM | POA: Diagnosis not present

## 2015-04-13 DIAGNOSIS — K859 Acute pancreatitis, unspecified: Secondary | ICD-10-CM | POA: Diagnosis not present

## 2015-04-13 DIAGNOSIS — R2689 Other abnormalities of gait and mobility: Secondary | ICD-10-CM | POA: Diagnosis not present

## 2015-04-13 DIAGNOSIS — N3281 Overactive bladder: Secondary | ICD-10-CM | POA: Diagnosis not present

## 2015-04-13 DIAGNOSIS — K219 Gastro-esophageal reflux disease without esophagitis: Secondary | ICD-10-CM | POA: Diagnosis not present

## 2015-04-13 DIAGNOSIS — R262 Difficulty in walking, not elsewhere classified: Secondary | ICD-10-CM | POA: Diagnosis not present

## 2015-04-13 DIAGNOSIS — E785 Hyperlipidemia, unspecified: Secondary | ICD-10-CM | POA: Diagnosis not present

## 2015-04-13 DIAGNOSIS — E46 Unspecified protein-calorie malnutrition: Secondary | ICD-10-CM | POA: Diagnosis not present

## 2015-04-13 DIAGNOSIS — K8040 Calculus of bile duct with cholecystitis, unspecified, without obstruction: Secondary | ICD-10-CM | POA: Diagnosis not present

## 2015-04-13 DIAGNOSIS — R062 Wheezing: Secondary | ICD-10-CM | POA: Diagnosis not present

## 2015-04-13 LAB — CBC
HEMATOCRIT: 42.9 % (ref 36.0–46.0)
Hemoglobin: 13.5 g/dL (ref 12.0–15.0)
MCH: 29.9 pg (ref 26.0–34.0)
MCHC: 31.5 g/dL (ref 30.0–36.0)
MCV: 95.1 fL (ref 78.0–100.0)
Platelets: 138 10*3/uL — ABNORMAL LOW (ref 150–400)
RBC: 4.51 MIL/uL (ref 3.87–5.11)
RDW: 13.7 % (ref 11.5–15.5)
WBC: 8.4 10*3/uL (ref 4.0–10.5)

## 2015-04-13 LAB — GLUCOSE, CAPILLARY
GLUCOSE-CAPILLARY: 148 mg/dL — AB (ref 65–99)
Glucose-Capillary: 183 mg/dL — ABNORMAL HIGH (ref 65–99)

## 2015-04-13 MED ORDER — FUROSEMIDE 20 MG PO TABS
20.0000 mg | ORAL_TABLET | Freq: Every day | ORAL | Status: DC
  Administered 2015-04-13: 20 mg via ORAL
  Filled 2015-04-13: qty 1

## 2015-04-13 MED ORDER — BISOPROLOL FUMARATE 10 MG PO TABS
10.0000 mg | ORAL_TABLET | Freq: Every day | ORAL | Status: DC
  Filled 2015-04-13: qty 1

## 2015-04-13 MED ORDER — AMLODIPINE BESYLATE 10 MG PO TABS
10.0000 mg | ORAL_TABLET | Freq: Every day | ORAL | Status: DC
  Administered 2015-04-13: 10 mg via ORAL
  Filled 2015-04-13: qty 1

## 2015-04-13 MED ORDER — BUDESONIDE-FORMOTEROL FUMARATE 160-4.5 MCG/ACT IN AERO
2.0000 | INHALATION_SPRAY | Freq: Two times a day (BID) | RESPIRATORY_TRACT | Status: DC
  Administered 2015-04-13: 2 via RESPIRATORY_TRACT
  Filled 2015-04-13: qty 6

## 2015-04-13 MED ORDER — AMLODIPINE BESYLATE 10 MG PO TABS: 10.0000 mg | ORAL_TABLET | Freq: Every day | ORAL | Status: DC

## 2015-04-13 MED ORDER — BUDESONIDE-FORMOTEROL FUMARATE 160-4.5 MCG/ACT IN AERO: 2.0000 | INHALATION_SPRAY | Freq: Two times a day (BID) | RESPIRATORY_TRACT | Status: DC

## 2015-04-13 MED ORDER — HYDRALAZINE HCL 25 MG PO TABS
25.0000 mg | ORAL_TABLET | Freq: Three times a day (TID) | ORAL | Status: DC
  Filled 2015-04-13 (×3): qty 1

## 2015-04-13 MED ORDER — TRAMADOL HCL 50 MG PO TABS: 50.0000 mg | ORAL_TABLET | Freq: Four times a day (QID) | ORAL | Status: DC | PRN

## 2015-04-13 NOTE — Progress Notes (Signed)
2 Days Post-Op  Subjective: She is sitting up eating breakfast.  Tolerating diet.  Abdomen is sore but otherwise doing well.  She has ambulated with PT yesterday.  Planning on SNF for discharge.  Still wheezing Audible walking into room.  Objective: Vital signs in last 24 hours: Temp:  [98.4 F (36.9 C)-98.9 F (37.2 C)] 98.4 F (36.9 C) (08/30 0553) Pulse Rate:  [69-73] 69 (08/30 0553) Resp:  [16-20] 20 (08/30 0553) BP: (175-185)/(66-74) 184/70 mmHg (08/30 0553) SpO2:  [94 %-96 %] 94 % (08/30 0553) Last BM Date: 04/10/15 PO 120 ml Afebrile, VSS Cbc is stable Diet: carb modified Intake/Output from previous day: 08/29 0701 - 08/30 0700 In: 120 [P.O.:120] Out: -  Intake/Output this shift: Total I/O In: 120 [P.O.:120] Out: -   General appearance: alert, cooperative and no distress Resp: bilateral wheezing GI: soft, sore, Port sites all look good, dressing removed, + BS.  Lab Results:   Recent Labs  04/12/15 0504 04/13/15 0540  WBC 7.8 8.4  HGB 13.5 13.5  HCT 42.0 42.9  PLT 149* 138*    BMET  Recent Labs  04/11/15 0620 04/12/15 0504  NA 140 137  K 3.9 4.1  CL 106 102  CO2 26 30  GLUCOSE 128* 136*  BUN 8 7  CREATININE 0.77 0.68  CALCIUM 9.5 9.0   PT/INR No results for input(s): LABPROT, INR in the last 72 hours.   Recent Labs Lab 04/08/15 0550 04/09/15 0520 04/10/15 0541 04/11/15 0620 04/12/15 0504  AST 158* 64* 36 23 70*  ALT 154* 98* 67* 55* 75*  ALKPHOS 95 81 73 75 88  BILITOT 0.5 0.6 1.0 0.8 0.9  PROT 6.1* 6.2* 6.2* 7.4 6.1*  ALBUMIN 3.1* 3.0* 3.0* 3.2* 2.7*     Lipase     Component Value Date/Time   LIPASE 46 04/12/2015 0504     Studies/Results: Dg Cholangiogram Operative  04/11/2015   CLINICAL DATA:  Cholecystitis, intraoperative cholangiography, history hypertension, diabetes mellitus, CHF  EXAM: INTRAOPERATIVE CHOLANGIOGRAM  TECHNIQUE: Cholangiographic images from the C-arm fluoroscopic device were submitted for interpretation  post-operatively. Please see the procedural report for the amount of contrast and the fluoroscopy time utilized.  COMPARISON:  Ultrasound abdomen 04/07/2015  FLUOROSCOPY TIME:  0 minutes 32 seconds  Images:  Single image plus a cine series of 140 frames.  FINDINGS: With contrast injection into the cystic duct stump, a markedly dilated cystic duct, common bile duct and common hepatic duct are identified.  Free spillage of contrast into the duodenum is identified.  Small amount of reflux of contrast into the distal pancreatic duct is seen.  Intrahepatic biliary radicles do not opacify during this exam.  No definite mass or stricture identified.  Single tiny questionable filling defect versus artifact noted at distal CBD at ampulla.  IMPRESSION: Significant extrahepatic biliary dilatation though there is free spillage of contrast into the duodenum indicating CBD patency.  Question tiny filling defect in distal CBD at ampulla versus artifact, tiny distal CBD stone not excluded.   Electronically Signed   By: Lavonia Dana M.D.   On: 04/11/2015 09:58    Medications: . bisoprolol  10 mg Oral Daily  . hydrALAZINE  25 mg Oral TID  . insulin aspart  0-9 Units Subcutaneous TID WC  . lip balm  1 application Topical BID  . polyethylene glycol  17 g Oral BID  . saccharomyces boulardii  250 mg Oral BID  . sodium chloride  3 mL Intravenous Q12H  Assessment/Plan ADHESIONS, CHRONIC CHOLECYSTITIS, GALLSTONE PANCREATITIS, NONOBSTRUCTING CHOLEDOCOLITHIASIS S/p LAPAROSCOPIC LYSIS OF ADHESIONS, LAPAROSCOPIC CHOLECYSTECTOMY WITH INTRAOPERATIVE CHOLANGIOGRAM, 04/11/15, Dr. Michael Boston  POD 2 Hypertension AODM ID Chronic diastolic CHF Dyspnea  Hyperlipidemia Hypokalemia Antibiotics: completed 5 days of Zosyn, 04/11/15 DVT:  SCD's    Plan:  She can shower and go to SNF when medically ready.  I would keep a dry dressing over umbilical port for a couple days, Follow up information in AVS.   LOS: 5 days     Heidi Dominguez 04/13/2015

## 2015-04-13 NOTE — Care Management Note (Signed)
Case Management Note  Patient Details  Name: TANAJAH BOULTER MRN: [id number] Date of Birth: 06-Jan-1921  Subjective/Objective:                    Action/Plan:d/c SNF.   Expected Discharge Date:                  Expected Discharge Plan:  Skilled Nursing Facility  In-House Referral:  NA, Clinical Social Work  Discharge planning Services  CM Consult  Post Acute Care Choice:  NA Choice offered to:  NA  DME Arranged:    DME Agency:     HH Arranged:    [place] Agency:     Status of Service:  Completed, signed off  Medicare Important Message Given:  Yes-second notification given Date Medicare IM Given:    Medicare IM give by:    Date Additional Medicare IM Given:    Additional Medicare Important Message give by:     If discussed at [place] of Stay Meetings, dates discussed:    Additional Comments:  Dessa Phi, RN 04/13/2015, 10:57 AM

## 2015-04-13 NOTE — Discharge Summary (Signed)
Physician Discharge Summary  Heidi Dominguez VHQ:[id number] DOB: 02/19/1921 DOA: 04/07/2015  PCP: Ginette Otto, MD  Admit date: 04/07/2015 Discharge date: 04/13/2015  Time spent: Greater than 30 minutes  Recommendations for Outpatient Follow-up:  1. Dr. Karie Soda, general surgery on 04/26/15 at 1:30 PM. Please arrive for this appointment at 1 PM. 2. M.D. at SNF in 3 days-to be seen with repeat labs (CBC & CMP). 3. Dr. Sandrea Hughs, pulmonology: SNF to arrange follow-up appointment in 1 week. 4. Dr. Merlene Laughter, PCP upon discharge from SNF.  Discharge Diagnoses:  Principal Problem:   Acute gallstone pancreatitis s/p lap chole w IOC 04/11/2015 Active Problems:   Essential hypertension   Dyslipidemia   Diabetes mellitus type 2, controlled   Gallstone pancreatitis   Mitral regurgitation   Chronic diastolic CHF (congestive heart failure)   Pre-operative cardiovascular examination, high risk surgery   Choledocholithiasis with chronic cholecystitis, nonobstructing   Discharge Condition: Improved & Stable  Diet recommendation: Heart healthy & diabetic diet.  Filed Weights   04/10/15 0401 04/11/15 0603 04/12/15 0607  Weight: 86.6 kg (190 lb 14.7 oz) 87.1 kg (192 lb 0.3 oz) 90 kg (198 lb 6.6 oz)    History of present illness:  81 year old female patient with history of DM 2, HTN, chronic diastolic CHF, HLD, GERD, chronic wheezing/dyspnea (recently saw pulmonology), presented to the hospital with abdominal pain and diagnosed with acute gallstone pancreatitis. She was treated initially with supportive management. Cardiology and pulmonology provided preop clearance. Surgery then performed laparoscopic cholecystectomy.  Hospital Course:   Acute gallstone pancreatitis - Treatedsupportively with bowel rest, IV fluids and pain management. - Lipase has decreased from 2374 > 1645 >653>211>141 >46. Transaminases have nearly normalized - Status post laparoscopic cholecystectomy  8/28. - Pancreatitis clinically resolved.  Symptomatic cholelithiasis with chronic cholecystitis & nonobstructive choledocholithiasis, s/p laparoscopic cholecystectomy - Ultrasound abdomen confirms distended gallbladder with gallstones and a 8 mm stone at the neck of the gallbladder. No secondary findings of cholecystitis. CBD dilated up to 13.8 mm but stable compared to CT 2013. - After preop clearance by cardiology and pulmonology, patient underwent laparoscopic cholecystectomy on 8/28. - Clinically improved. Surgery has seen today and cleared her for discharge. They have arranged outpatient follow-up.  Essential hypertension - Mildly uncontrolled. Patient was on Diovan, labetalol and amlodipine prior to admission. She is being evaluated by pulmonology as outpatient for >6 months history of wheezing. She had been recently switched from ACEI to Diovan with plans to DC ARB if wheezing persisted. Patient continues to have wheezing hence ARB was not started in the hospital. Likewise labetalol was also not resumed. As per pulmonology instructions, patient was started on bisoprolol 5 MG daily. However patient's blood pressure remained uncontrolled and she was noted to be wheezing this morning. Discussed with pulmonology today and plan to discontinue all beta blockers and will resume amlodipine at increased dose of 10 MG daily. This can be monitored at SNF and further adjustments to her antihypertensive regimen can be made.  Type II DM, controlled - On NovoLog SSI in the hospital. Resume Januvia at discharge. - As per PCP, well controlled based on recent A1c in May in the low 6 range.  HLD - Lipid panel unremarkable  Chronic diastolic CHF - Compensated. Does not look overtly volume overloaded. We'll resume Lasix today.  Dyspnea/wheezing, chronic - Requested pulmonary consultation for assessment, management and preop risk evaluation. - As per outpatient pulmonary evaluation, switch from ACEI to ARB  (done with mild improvement) with  next step being switching to selective beta blocker (Bystolic/Bisoprolol) - Please see discussion above. All beta blockers and ARB discontinued. As discussed with pulmonology today, added Symbicort. Outpatient follow-up with pulmonology. Continue when necessary Xopenex. Although patient has some wheezing, she does not appear to be in respiratory distress.  Hypokalemia - Replaced  Thrombocytopenia - Maybe related to acute illness or antibiotics. No bleeding reported. Follow CBC in a couple of days at [facility].   Discussed extensively with patient's daughter Mrs. Nobie Putnam on 8/30. Updated care and answered questions.   Consultants:  General surgery  Cardiology  Pulmonology  Procedures:  Laparoscopic lysis of adhesions, cholecystectomy with IOC on 8/28  2-D echo 04/09/15: Study Conclusions  - Left ventricle: The cavity size was normal. There was mild concentric hypertrophy. Systolic function was normal. The estimated ejection fraction was in the range of 60% to 65%. Wall motion was normal; there were no regional wall motion abnormalities. There was an increased relative contribution of atrial contraction to ventricular filling. Doppler parameters are consistent with abnormal left ventricular relaxation (grade 1 diastolic dysfunction). Doppler parameters are consistent with high ventricular filling pressure. - Aortic valve: Moderately calcified annulus. Trileaflet. Thickening and calcification. There was mild stenosis. Valve area (VTI): 1.33 cm^2. Valve area (Vmax): 1.45 cm^2. Valve area (Vmean): 1.25 cm^2. - Mitral valve: Calcified annulus. There was mild regurgitation. - Left atrium: The atrium was severely dilated. - Pulmonary arteries: PA peak pressure: 32 mm Hg (S)  Discharge Exam:  Complaints: Tolerating regular consistency diet. Flatus + +. Last BM 3 days ago (had 3 BMs on Saturday). No nausea or vomiting.  Abdomen sore but improving. No chest pain or dyspnea but complains of some intermittent wheezing.  Filed Vitals:   04/12/15 2120 04/13/15 0553 04/13/15 1039 04/13/15 1041  BP: 185/66 184/70    Pulse: 73 69    Temp: 98.9 F (37.2 C) 98.4 F (36.9 C)    TempSrc: Oral Oral    Resp: 20 20    Height:      Weight:      SpO2: 96% 94% 94% 94%    General exam: pleasant elderly female lying comfortably propped up in bed. Respiratory system: Occasional bilateral wheezing,? Upper airway. Fair bilateral breath sounds. No increased work of breathing. Cardiovascular system: S1 & S2 heard, RRR. No JVD, murmurs, gallops, clicks. Trace bilateral ankle edema. Gastrointestinal system: Abdomen is nondistended, soft and nontender. Normal bowel sounds heard. Laparoscopy site dressings clean and dry. Central nervous system: Alert and oriented. No focal neurological deficits. Extremities: Symmetric 5 x 5 power.  Discharge Instructions      Discharge Instructions    (HEART FAILURE PATIENTS) Call MD:  Anytime you have any of the following symptoms: 1) 3 pound weight gain in 24 hours or 5 pounds in 1 week 2) shortness of breath, with or without a dry hacking cough 3) swelling in the hands, feet or stomach 4) if you have to sleep on extra pillows at night in order to breathe.    Complete by:  As directed      Call MD for:  difficulty breathing, headache or visual disturbances    Complete by:  As directed      Call MD for:  extreme fatigue    Complete by:  As directed      Call MD for:  hives    Complete by:  As directed      Call MD for:  persistant dizziness or light-headedness    Complete  by:  As directed      Call MD for:  persistant nausea and vomiting    Complete by:  As directed      Call MD for:  redness, tenderness, or signs of infection (pain, swelling, redness, odor or green/yellow discharge around incision site)    Complete by:  As directed      Call MD for:  severe uncontrolled pain     Complete by:  As directed      Call MD for:    Complete by:  As directed   Temperature > 101.59F     Diet - low sodium heart healthy    Complete by:  As directed      Diet Carb Modified    Complete by:  As directed      Discharge instructions    Complete by:  As directed   Please see discharge instruction sheets.  Also refer to handout given an office.  Please call our office if you have any questions or concerns [phone]     Discharge wound care:    Complete by:  As directed   If you have closed incisions, shower and bathe over these incisions with soap and water every day.  Remove all surgical dressings on postoperative day #3.  You do not need to replace dressings over the closed incisions unless you feel more comfortable with a Band-Aid covering it.   If you have an open wound that requires packing, please see wound care instructions.  In general, remove all dressings, wash wound with soap and water and then replace with saline moistened gauze.  Do the dressing change at least every day.  Please call our office [phone] if you have further questions.     Driving Restrictions    Complete by:  As directed   No driving until off narcotics and can safely swerve away without pain during an emergency     Increase activity slowly    Complete by:  As directed   Walk an hour a day.  Use 20-30 minute walks.  When you can walk 30 minutes without difficulty, it is fine to restart low impact/moderate activities such as biking, jogging, swimming, sexual activity, etc.  Eventually you can increase to unrestricted activity when not feeling pain.  If you feel pain: STOP!Marland Kitchen   Let pain protect you from overdoing it.  Use ice/heat & over-the-counter pain medications to help minimize soreness.  If that is not enough, then use your narcotic pain prescription as needed to remain active.  It is better to take extra pain medications and be more active than to stay bedridden to avoid all pain medications.      Lifting restrictions    Complete by:  As directed   Avoid heavy lifting initially.  Do not push through pain.  You have no specific weight limit - if it hurts to do, DON'T DO IT.   If you feel no pain, you are not injuring anything.  Pain will protect you from injury.  Coughing and sneezing are far more stressful to your incision than any lifting.  Avoid resuming heavy lifting / intense activity until off all narcotic pain medications.  When ready to exercise more, give yourself 2 weeks to gradually get back to full intense exercise/activity.     May shower / Bathe    Complete by:  As directed      May walk up steps    Complete by:  As directed  Sexual Activity Restrictions    Complete by:  As directed   Sexual activity as tolerated.  Do not push through pain.  Pain will protect you from injury.     Walk with assistance    Complete by:  As directed   Walk over an hour a day.  May use a walker/cane/companion to help with balance and stamina.            Medication List    STOP taking these medications        cefUROXime 250 MG tablet  Commonly known as:  CEFTIN     labetalol 200 MG tablet  Commonly known as:  NORMODYNE     valsartan 160 MG tablet  Commonly known as:  DIOVAN      TAKE these medications        acetaminophen 325 MG tablet  Commonly known as:  TYLENOL  Take 2 tablets (650 mg total) by mouth every 6 (six) hours as needed (or Fever >/= 101).     amLODipine 10 MG tablet  Commonly known as:  NORVASC  Take 1 tablet (10 mg total) by mouth daily.     aspirin 81 MG tablet  Take 81 mg by mouth daily.     benzonatate 100 MG capsule  Commonly known as:  TESSALON  Take by mouth 3 (three) times daily as needed for cough.     budesonide-formoterol 160-4.5 MCG/ACT inhaler  Commonly known as:  SYMBICORT  Inhale 2 puffs into the lungs 2 (two) times daily.     furosemide 20 MG tablet  Commonly known as:  LASIX  1 TABLET (20 MG) BY MOUTH DAILY     Iron 240 (27  FE) MG Tabs  Take 1 tablet by mouth daily.     JANUVIA 50 MG tablet  Generic drug:  sitaGLIPtin  1 TABLET (50 MG) BY MOUTH DAILY     levalbuterol 1.25 MG/3ML nebulizer solution  Commonly known as:  XOPENEX  Take 1.25 mg by nebulization every 4 (four) hours as needed for wheezing.     Magnesium 250 MG Tabs  Take 250 mg by mouth daily.     MYRBETRIQ 50 MG Tb24 tablet  Generic drug:  mirabegron ER  Take 50 mg by mouth daily.     potassium chloride 10 MEQ tablet  Commonly known as:  K-DUR,KLOR-CON  Take 10 mEq by mouth daily.     pravastatin 40 MG tablet  Commonly known as:  PRAVACHOL  Take 40 mg by mouth daily.     ranitidine 150 MG tablet  Commonly known as:  ZANTAC  Take 1 tablet (150 mg total) by mouth 2 (two) times daily.     traMADol 50 MG tablet  Commonly known as:  ULTRAM  Take 1-2 tablets (50-100 mg total) by mouth every 6 (six) hours as needed for moderate pain or severe pain.     VENTOLIN HFA 108 (90 BASE) MCG/ACT inhaler  Generic drug:  albuterol  INHALE 2 PUFFS Q 4 H PRN     vitamin B-12 500 MCG tablet  Commonly known as:  CYANOCOBALAMIN  Take 500 mcg by mouth daily.     Vitamin D3 5000 UNITS Caps  Take 1 capsule by mouth daily.       Follow-up Information    Follow up with Ardeth Sportsman., MD. Schedule an appointment as soon as possible for a visit on 04/26/2015.   Specialty:  General Surgery   Why:  Please follow up with  Dr. Michaell Cowing on Monday, September 12th at 1:30pm. Please arrive for this appointment at 1:00pm.   Contact information:   [address] [place] [address] [phone]       Schedule an appointment as soon as possible for a visit with Ginette Otto, MD.   Specialty:  Internal Medicine   Why:  Upon discharge from SNF   Contact information:   [address] [place] [address] [phone]       Follow up with MD at SNF . Schedule an appointment as soon as possible for a visit in 3 days.    Why:  to be seen with repeat labs (CBC & CMP).      Follow up with Sandrea Hughs, MD. Schedule an appointment as soon as possible for a visit in 1 week.   Specialty:  Pulmonary Disease   Contact information:   [address] [place] [address] [phone]        The results of significant diagnostics from this hospitalization (including imaging, microbiology, ancillary and laboratory) are listed below for reference.    Significant Diagnostic Studies: Dg Chest 2 View  04/08/2015   CLINICAL DATA:  Mid chest pain today, history of CHF, pneumonia, and diabetes  EXAM: CHEST  2 VIEW  COMPARISON:  Chest x-ray of March 31, 2015  FINDINGS: The lungs are adequately inflated and clear. There is no interstitial or alveolar edema. The heart is mildly enlarged. The central pulmonary vascularity is engorged. There is mild tortuosity of the descending thoracic aorta. There is no significant pleural effusion. There prosthetic shoulder joints bilaterally. The observed thoracic vertebral bodies are preserved in height.  IMPRESSION: Mild prominence of the cardiac silhouette and central pulmonary vascularity extension weighted by the AP projection. There may be low-grade compensated CHF however. There is no evidence of pneumonia.   Electronically Signed   By: David  [place] M.D.   On: 04/08/2015 16:57   Dg Chest 2 View  03/31/2015   CLINICAL DATA:  Cough and wheezing .  EXAM: CHEST  2 VIEW  COMPARISON:  03/13/2014.  FINDINGS: Mediastinum and hilar structures normal. Cardiomegaly with normal pulmonary vascularity. No focal pulmonary infiltrate. No pleural effusion or pneumothorax. Bilateral shoulder replacements.  IMPRESSION: 1. Cardiomegaly.  2.  No focal pulmonary infiltrates.   Electronically Signed   By: Maisie Fus  Register   On: 03/31/2015 14:23   Dg Cholangiogram Operative  04/11/2015   CLINICAL DATA:  Cholecystitis, intraoperative cholangiography, history hypertension, diabetes mellitus, CHF  EXAM:  INTRAOPERATIVE CHOLANGIOGRAM  TECHNIQUE: Cholangiographic images from the C-arm fluoroscopic device were submitted for interpretation post-operatively. Please see the procedural report for the amount of contrast and the fluoroscopy time utilized.  COMPARISON:  Ultrasound abdomen 04/07/2015  FLUOROSCOPY TIME:  0 minutes 32 seconds  Images:  Single image plus a cine series of 140 frames.  FINDINGS: With contrast injection into the cystic duct stump, a markedly dilated cystic duct, common bile duct and common hepatic duct are identified.  Free spillage of contrast into the duodenum is identified.  Small amount of reflux of contrast into the distal pancreatic duct is seen.  Intrahepatic biliary radicles do not opacify during this exam.  No definite mass or stricture identified.  Single tiny questionable filling defect versus artifact noted at distal CBD at ampulla.  IMPRESSION: Significant extrahepatic biliary dilatation though there is free spillage of contrast into the duodenum indicating CBD patency.  Question tiny filling defect in distal CBD at  ampulla versus artifact, tiny distal CBD stone not excluded.   Electronically Signed   By: Ulyses Southward M.D.   On: 04/11/2015 09:58   US Abdomen Limited Ruq  04/08/2015   CLINICAL DATA:  81 year old female with epigastric and right upper quadrant abdominal pain and elevated LFTs. Question gallstones.  EXAM: US ABDOMEN LIMITED - RIGHT UPPER QUADRANT  COMPARISON:  CT 08/07/2012  FINDINGS: Gallbladder:  Distended with 8 mm stone in the neck. No gallbladder wall thickening, wall thickness of 3 mm. No sonographic Murphy sign noted.  Common bile duct:  Diameter: 13.8 mm, distended. The more distal common bile duct is obscured.  Liver:  No focal lesion identified. Within normal limits in parenchymal echogenicity.  IMPRESSION: 1. Distended gallbladder with cholelithiasis, 8 mm stone in the neck. No secondary findings of cholecystitis. 2. Distended common bile duct, up to 13.8 mm.  However, this appears unchanged on comparison to prior CT of 2013.   Electronically Signed   By: Rubye Oaks M.D.   On: 04/08/2015 00:28    Microbiology: Recent Results (from the past 240 hour(s))  Surgical pcr screen     Status: None   Collection Time: 04/11/15  5:44 AM  Result Value Ref Range Status   MRSA, PCR NEGATIVE NEGATIVE Final   Staphylococcus aureus NEGATIVE NEGATIVE Final    Comment:        The Xpert SA Assay (FDA approved for NASAL specimens in patients over 28 years of age), is one component of a comprehensive surveillance program.  Test performance has been validated by [facility] for patients greater than or equal to 45 year old. It is not intended to diagnose infection nor to guide or monitor treatment.      Labs: Basic Metabolic Panel:  Recent Labs Lab 04/08/15 0550 04/09/15 0520 04/10/15 0541 04/11/15 0620 04/12/15 0504  NA 141 141 139 140 137  K 3.7 3.3* 3.5 3.9 4.1  CL 107 105 105 106 102  CO2 29 27 27 26 30   GLUCOSE 130* 106* 115* 128* 136*  BUN 16 13 10 8 7   CREATININE 0.75 0.81 0.81 0.77 0.68  CALCIUM 9.0 9.1 9.3 9.5 9.0  MG  --   --   --   --  1.7   Liver Function Tests:  Recent Labs Lab 04/08/15 0550 04/09/15 0520 04/10/15 0541 04/11/15 0620 04/12/15 0504  AST 158* 64* 36 23 70*  ALT 154* 98* 67* 55* 75*  ALKPHOS 95 81 73 75 88  BILITOT 0.5 0.6 1.0 0.8 0.9  PROT 6.1* 6.2* 6.2* 7.4 6.1*  ALBUMIN 3.1* 3.0* 3.0* 3.2* 2.7*    Recent Labs Lab 04/08/15 0550 04/09/15 0520 04/10/15 0541 04/11/15 0620 04/12/15 0504  LIPASE 1645* 653* 211* 141* 46   No results for input(s): AMMONIA in the last 168 hours. CBC:  Recent Labs Lab 04/09/15 0520 04/10/15 0541 04/11/15 0620 04/12/15 0504 04/13/15 0540  WBC 8.0 8.7 9.2 7.8 8.4  HGB 12.5 13.0 14.3 13.5 13.5  HCT 40.0 42.2 45.4 42.0 42.9  MCV 95.5 95.7 94.8 95.2 95.1  PLT 139* 145* 154 149* 138*   Cardiac Enzymes: No results for input(s): CKTOTAL, CKMB, CKMBINDEX,  TROPONINI in the last 168 hours. BNP: BNP (last 3 results) No results for input(s): BNP in the last 8760 hours.  ProBNP (last 3 results) No results for input(s): PROBNP in the last 8760 hours.  CBG:  Recent Labs Lab 04/12/15 0737 04/12/15 1155 04/12/15 1730 04/12/15 2123 04/13/15 0734  GLUCAP  139* 205* 112* 162* 148*        Signed:  Chevy Sweigert, MD, FACP, FHM. [facility] Pager [phone]  If 7PM-7AM, please contact night-coverage [email or web address] Password [email or web address] 04/13/2015, 11:41 AM

## 2015-04-13 NOTE — Telephone Encounter (Signed)
LMTCB

## 2015-04-13 NOTE — Progress Notes (Signed)
Removed all dressing from lap choley sites. Washed with soap and water and patted dry. Covered each site with gauze and tegaderm.

## 2015-04-13 NOTE — Clinical Social Work Placement (Signed)
   CLINICAL SOCIAL WORK PLACEMENT  NOTE  Date:  04/13/2015  Patient Details  Name: Heidi Dominguez MRN: [id number] Date of Birth: 1921-03-01  Clinical Social Work is seeking post-discharge placement for this patient at the [place] level of care (*CSW will initial, date and re-position this form in  chart as items are completed):  Yes   Patient/family provided with [place] Work Department's list of facilities offering this level of care within the geographic area requested by the patient (or if unable, by the patient's family).  Yes   Patient/family informed of their freedom to choose among providers that offer the needed level of care, that participate in Medicare, Medicaid or managed care program needed by the patient, have an available bed and are willing to accept the patient.  Yes   Patient/family informed of [facility]'s ownership interest in [facility] and [facility], as well as of the fact that they are under no obligation to receive care at these facilities.  PASRR submitted to EDS on       PASRR number received on       Existing PASRR number confirmed on 04/12/15     FL2 transmitted to all facilities in geographic area requested by pt/family on 04/12/15     FL2 transmitted to all facilities within larger geographic area on       Patient informed that his/her managed care company has contracts with or will negotiate with certain facilities, including the following:        Yes   Patient/family informed of bed offers received.  Patient chooses bed at [facility]     Physician recommends and patient chooses bed at      Patient to be transferred to [facility] on 04/13/15.  Patient to be transferred to facility by [facility]     Patient family notified on 04/13/15 of transfer.  Name of family member notified:  Sindy Messing, LCSW, [place]     PHYSICIAN       Additional Comment:     _______________________________________________ Boone Master, [place] 04/13/2015, 2:15 PM

## 2015-04-13 NOTE — Progress Notes (Signed)
Clinical Social Work  CSW faxed DC summary to [facility] who is agreeable to accept patient today. CSW prepared DC packet with FL2, DC summary and hard scripts included. Patient and family aware of DC plans and agreeable for [facility] to transport and aware of no guarantee of payment. RN to call report. [facility] arranged for 3pm pick up. Request #: [id number].  CSW is signing off but available if needed.  [place], [place] [phone]

## 2015-04-13 NOTE — Progress Notes (Signed)
Gave report to [place] at [facility]. Left number if she had additional questions.

## 2015-04-14 ENCOUNTER — Encounter: Payer: Self-pay | Admitting: Adult Health

## 2015-04-14 ENCOUNTER — Non-Acute Institutional Stay (SKILLED_NURSING_FACILITY): Payer: Medicare Other | Admitting: Adult Health

## 2015-04-14 DIAGNOSIS — R0600 Dyspnea, unspecified: Secondary | ICD-10-CM | POA: Diagnosis not present

## 2015-04-14 DIAGNOSIS — E43 Unspecified severe protein-calorie malnutrition: Secondary | ICD-10-CM | POA: Diagnosis not present

## 2015-04-14 DIAGNOSIS — N3281 Overactive bladder: Secondary | ICD-10-CM | POA: Diagnosis not present

## 2015-04-14 DIAGNOSIS — I5032 Chronic diastolic (congestive) heart failure: Secondary | ICD-10-CM

## 2015-04-14 DIAGNOSIS — K8040 Calculus of bile duct with cholecystitis, unspecified, without obstruction: Secondary | ICD-10-CM | POA: Diagnosis not present

## 2015-04-14 DIAGNOSIS — I10 Essential (primary) hypertension: Secondary | ICD-10-CM

## 2015-04-14 DIAGNOSIS — K219 Gastro-esophageal reflux disease without esophagitis: Secondary | ICD-10-CM

## 2015-04-14 DIAGNOSIS — E785 Hyperlipidemia, unspecified: Secondary | ICD-10-CM | POA: Diagnosis not present

## 2015-04-14 DIAGNOSIS — E119 Type 2 diabetes mellitus without complications: Secondary | ICD-10-CM

## 2015-04-14 DIAGNOSIS — K8044 Calculus of bile duct with chronic cholecystitis without obstruction: Secondary | ICD-10-CM

## 2015-04-14 DIAGNOSIS — R5381 Other malaise: Secondary | ICD-10-CM

## 2015-04-14 DIAGNOSIS — E876 Hypokalemia: Secondary | ICD-10-CM | POA: Diagnosis not present

## 2015-04-14 NOTE — Progress Notes (Signed)
Patient ID: Heidi Dominguez, female   DOB: 22-Nov-1920, 81 y.o.   MRN: [id number]    DATE:  04/14/2015 MRN:  [id number]  BIRTHDAY: 09-May-1921  Facility:  Nursing Home Location:  [place] Room Number: 149-F  LEVEL OF CARE:  SNF [phone])  Contact Information    Name Relation Home Work Mobile   Solt,[name] Daughter [phone]  [phone]   Hokulani, Rogel [phone]  [phone]      Chief Complaint  Patient presents with  . Hospitalization Follow-up    Physical deconditioning, cholelithiasis/chronic cholecystitis S/P laparoscope peak cholecystectomy, chronic dyspnea, essential hypertension, diabetes mellitus, hyperlipidemia, chronic diastolic CHF, overactive bladder, hypokalemia, GERD and protein calorie malnutrition    HISTORY OF PRESENT ILLNESS:  This is a 81 year old female who has been admitted to [facility] on 04/13/15 from [facility]. She has PMH of diabetes mellitus type 2, hypertension, chronic diastolic CHF, hyperlipidemia and GERD. She was having abdominal pain and was diagnosed with acute gallstone pancreatitis. She was initially treated with supportive management - bowel rest, IV fluids and pain management. She then had laparoscopic cholecystectomy on 04/11/15.  She has been admitted for a short-term rehabilitation.  PAST MEDICAL HISTORY:  Past Medical History  Diagnosis Date  . Diabetes mellitus   . Hypertension   . High cholesterol   . Osteoarthritis   . Degenerative joint disease of shoulder region   . HOH (hard of hearing)   . Complication of anesthesia     I have a hard time waking up"  . Chronic diastolic CHF (congestive heart failure)   . Pneumonia   . GERD (gastroesophageal reflux disease)      CURRENT MEDICATIONS: Reviewed  Patient's Medications  New Prescriptions   No medications on file  Previous Medications   ACETAMINOPHEN (TYLENOL) 325 MG TABLET    Take 2 tablets (650 mg total) by mouth every  6 (six) hours as needed (or Fever >/= 101).   AMLODIPINE (NORVASC) 10 MG TABLET    Take 1 tablet (10 mg total) by mouth daily.   ASPIRIN 81 MG TABLET    Take 81 mg by mouth daily.   BENZONATATE (TESSALON) 100 MG CAPSULE    Take by mouth 3 (three) times daily as needed for cough.   BUDESONIDE-FORMOTEROL (SYMBICORT) 160-4.5 MCG/ACT INHALER    Inhale 2 puffs into the lungs 2 (two) times daily.   CHOLECALCIFEROL (VITAMIN D3) 5000 UNITS CAPS    Take 1 capsule by mouth daily.   FERROUS GLUCONATE (IRON) 240 (27 FE) MG TABS    Take 1 tablet by mouth daily.   FUROSEMIDE (LASIX) 20 MG TABLET    1 TABLET (20 MG) BY MOUTH DAILY   JANUVIA 50 MG TABLET    1 TABLET (50 MG) BY MOUTH DAILY   LEVALBUTEROL (XOPENEX) 1.25 MG/3ML NEBULIZER SOLUTION    Take 1.25 mg by nebulization every 4 (four) hours as needed for wheezing.   MAGNESIUM 250 MG TABS    Take 250 mg by mouth daily.    MIRABEGRON ER (MYRBETRIQ) 50 MG TB24 TABLET    Take 50 mg by mouth daily.   POTASSIUM CHLORIDE (K-DUR,KLOR-CON) 10 MEQ TABLET    Take 10 mEq by mouth daily.    PRAVASTATIN (PRAVACHOL) 40 MG TABLET    Take 40 mg by mouth daily.   RANITIDINE (ZANTAC) 150 MG TABLET    Take 1 tablet (150 mg total) by mouth 2 (two) times daily.   TRAMADOL Veatrice Bourbon)  50 MG TABLET    Take 1-2 tablets (50-100 mg total) by mouth every 6 (six) hours as needed for moderate pain or severe pain.   VENTOLIN HFA 108 (90 BASE) MCG/ACT INHALER    INHALE 2 PUFFS Q 4 H PRN   VITAMIN B-12 (CYANOCOBALAMIN) 500 MCG TABLET    Take 500 mcg by mouth daily.  Modified Medications   No medications on file  Discontinued Medications   No medications on file     Allergies  Allergen Reactions  . Catapres [Clonidine Hcl] Other (See Comments)    Unknown.  Lorrin Goodell [Verapamil Hcl] Other (See Comments)    Unknown.  . Other Other (See Comments)    Any type of narcotics Feels "loopy"  . Sulfa Antibiotics Itching     REVIEW OF SYSTEMS:  GENERAL: no change in appetite, no  fatigue, no weight changes, no fever, chills or weakness EYES: Denies change in vision, dry eyes, eye pain, itching or discharge EARS: Denies change in hearing, ringing in ears, or earache NOSE: Denies nasal congestion or epistaxis MOUTH and THROAT: Denies oral discomfort, gingival pain or bleeding, pain from teeth or hoarseness   RESPIRATORY: no cough, SOB, DOE, wheezing, hemoptysis CARDIAC: no chest pain, edema or palpitations GI: no abdominal pain, diarrhea, constipation, heart burn, nausea or vomiting GU: Denies dysuria, frequency, hematuria, incontinence, or discharge PSYCHIATRIC: Denies feeling of depression or anxiety. No report of hallucinations, insomnia, paranoia, or agitation   PHYSICAL EXAMINATION  GENERAL APPEARANCE: Well nourished. In no acute distress. Obese SKIN:  3 laparoscopic sites on upper quadrant and 1 on umbilical area, dry, no erythema HEAD: Normal in size and contour. No evidence of trauma EYES: Lids open and close normally. No blepharitis, entropion or ectropion. PERRL. Conjunctivae are clear and sclerae are white. Lenses are without opacity EARS: Pinnae are normal. Patient hears normal voice tunes of the examiner MOUTH and THROAT: Lips are without lesions. Oral mucosa is moist and without lesions. Tongue is normal in shape, size, and color and without lesions NECK: supple, trachea midline, no neck masses, no thyroid tenderness, no thyromegaly LYMPHATICS: no LAN in the neck, no supraclavicular LAN RESPIRATORY: breathing is even & unlabored, BS CTAB CARDIAC: RRR, no murmur,no extra heart sounds, no edema GI: abdomen soft, normal BS, no masses, no tenderness, no hepatomegaly, no splenomegaly EXTREMITIES:  Able to move 4 extremities PSYCHIATRIC: Alert and oriented X 3. Affect and behavior are appropriate  LABS/RADIOLOGY: Labs reviewed: Basic Metabolic Panel:  Recent Labs  04/10/15 0541 04/11/15 0620 04/12/15 0504  NA 139 140 137  K 3.5 3.9 4.1  CL 105  106 102  CO2 27 26 30   GLUCOSE 115* 128* 136*  BUN 10 8 7   CREATININE 0.81 0.77 0.68  CALCIUM 9.3 9.5 9.0  MG  --   --  1.7   Liver Function Tests:  Recent Labs  04/10/15 0541 04/11/15 0620 04/12/15 0504  AST 36 23 70*  ALT 67* 55* 75*  ALKPHOS 73 75 88  BILITOT 1.0 0.8 0.9  PROT 6.2* 7.4 6.1*  ALBUMIN 3.0* 3.2* 2.7*    Recent Labs  04/10/15 0541 04/11/15 0620 04/12/15 0504  LIPASE 211* 141* 46   CBC:  Recent Labs  04/11/15 0620 04/12/15 0504 04/13/15 0540  WBC 9.2 7.8 8.4  HGB 14.3 13.5 13.5  HCT 45.4 42.0 42.9  MCV 94.8 95.2 95.1  PLT 154 149* 138*   A1C: Invalid input(s): A1C Lipid Panel:  Recent Labs  04/08/15 0800  HDL 62   CBG:  Recent Labs  04/12/15 2123 04/13/15 0734 04/13/15 1139  GLUCAP 162* 148* 183*     Dg Chest 2 View  04/08/2015   CLINICAL DATA:  Mid chest pain today, history of CHF, pneumonia, and diabetes  EXAM: CHEST  2 VIEW  COMPARISON:  Chest x-ray of March 31, 2015  FINDINGS: The lungs are adequately inflated and clear. There is no interstitial or alveolar edema. The heart is mildly enlarged. The central pulmonary vascularity is engorged. There is mild tortuosity of the descending thoracic aorta. There is no significant pleural effusion. There prosthetic shoulder joints bilaterally. The observed thoracic vertebral bodies are preserved in height.  IMPRESSION: Mild prominence of the cardiac silhouette and central pulmonary vascularity extension weighted by the AP projection. There may be low-grade compensated CHF however. There is no evidence of pneumonia.   Electronically Signed   By: David  Martinique M.D.   On: 04/08/2015 16:57   Dg Chest 2 View  03/31/2015   CLINICAL DATA:  Cough and wheezing .  EXAM: CHEST  2 VIEW  COMPARISON:  03/13/2014.  FINDINGS: Mediastinum and hilar structures normal. Cardiomegaly with normal pulmonary vascularity. No focal pulmonary infiltrate. No pleural effusion or pneumothorax. Bilateral shoulder  replacements.  IMPRESSION: 1. Cardiomegaly.  2.  No focal pulmonary infiltrates.   Electronically Signed   By: Marcello Moores  Register   On: 03/31/2015 14:23   Dg Cholangiogram Operative  04/11/2015   CLINICAL DATA:  Cholecystitis, intraoperative cholangiography, history hypertension, diabetes mellitus, CHF  EXAM: INTRAOPERATIVE CHOLANGIOGRAM  TECHNIQUE: Cholangiographic images from the C-arm fluoroscopic device were submitted for interpretation post-operatively. Please see the procedural report for the amount of contrast and the fluoroscopy time utilized.  COMPARISON:  Ultrasound abdomen 04/07/2015  FLUOROSCOPY TIME:  0 minutes 32 seconds  Images:  Single image plus a cine series of 140 frames.  FINDINGS: With contrast injection into the cystic duct stump, a markedly dilated cystic duct, common bile duct and common hepatic duct are identified.  Free spillage of contrast into the duodenum is identified.  Small amount of reflux of contrast into the distal pancreatic duct is seen.  Intrahepatic biliary radicles do not opacify during this exam.  No definite mass or stricture identified.  Single tiny questionable filling defect versus artifact noted at distal CBD at ampulla.  IMPRESSION: Significant extrahepatic biliary dilatation though there is free spillage of contrast into the duodenum indicating CBD patency.  Question tiny filling defect in distal CBD at ampulla versus artifact, tiny distal CBD stone not excluded.   Electronically Signed   By: Lavonia Dana M.D.   On: 04/11/2015 09:58   US Abdomen Limited Ruq  04/08/2015   CLINICAL DATA:  81 year old female with epigastric and right upper quadrant abdominal pain and elevated LFTs. Question gallstones.  EXAM: US ABDOMEN LIMITED - RIGHT UPPER QUADRANT  COMPARISON:  CT 08/07/2012  FINDINGS: Gallbladder:  Distended with 8 mm stone in the neck. No gallbladder wall thickening, wall thickness of 3 mm. No sonographic Murphy sign noted.  Common bile duct:  Diameter: 13.8 mm,  distended. The more distal common bile duct is obscured.  Liver:  No focal lesion identified. Within normal limits in parenchymal echogenicity.  IMPRESSION: 1. Distended gallbladder with cholelithiasis, 8 mm stone in the neck. No secondary findings of cholecystitis. 2. Distended common bile duct, up to 13.8 mm. However, this appears unchanged on comparison to prior CT of 2013.   Electronically Signed   By: Fonnie Birkenhead.D.  On: 04/08/2015 00:28    ASSESSMENT/PLAN:  Physical deconditioning - for rehabilitation  Cholelithiasis with chronic cholecystitis nonobstructive cholelithiasis S/P laparoscopic cholecystectomy - follow-up with Dr. Michael Boston, Gen. surgery, on 04/26/15; continue tramadol 50 mg 1-2 tabs by mouth every 6 hours when necessary for pain  Chronic dyspnea - continue Symbicort 160-4 0.5 g/ACT inhaler 2 puffs into the lungs twice a day, Xopenex when necessary and ventilating HFA when necessary; follow-up with Dr.Wert, pulmonology, on 04/23/15; check CBC  Essential hypertension - continue Norvasc 10 mg 1 tab by mouth daily  Diabetes mellitus, type II - continue Januvia 50 mg 1 tab by mouth daily  Hyperlipidemia - continue pravastatin 40 mg 1 tab by mouth daily  Chronic diastolic CHF - stable; continue Lasix 20 mg 1 tab by mouth daily; check BMP  Overactive bladder - continue Myrbetriq 50 mg 24-hour 1 tab by mouth daily  Hypokalemia - K4.1; continue KCl 10 meq 1 tab by mouth daily  GERD - continue Zantac 150 mg 1 tab by mouth twice a day  Protein calorie malnutrition, severe - albumin 2.7; RD consultation    Goals of care:  Short-term rehabilitation    [facility], NP [facility] [phone]

## 2015-04-15 ENCOUNTER — Encounter: Payer: Self-pay | Admitting: Internal Medicine

## 2015-04-15 ENCOUNTER — Non-Acute Institutional Stay (SKILLED_NURSING_FACILITY): Payer: Medicare Other | Admitting: Internal Medicine

## 2015-04-15 DIAGNOSIS — E119 Type 2 diabetes mellitus without complications: Secondary | ICD-10-CM | POA: Diagnosis not present

## 2015-04-15 DIAGNOSIS — R531 Weakness: Secondary | ICD-10-CM | POA: Diagnosis not present

## 2015-04-15 DIAGNOSIS — K851 Biliary acute pancreatitis without necrosis or infection: Secondary | ICD-10-CM

## 2015-04-15 DIAGNOSIS — N3281 Overactive bladder: Secondary | ICD-10-CM | POA: Diagnosis not present

## 2015-04-15 DIAGNOSIS — R062 Wheezing: Secondary | ICD-10-CM

## 2015-04-15 DIAGNOSIS — I5032 Chronic diastolic (congestive) heart failure: Secondary | ICD-10-CM | POA: Diagnosis not present

## 2015-04-15 DIAGNOSIS — E46 Unspecified protein-calorie malnutrition: Secondary | ICD-10-CM | POA: Diagnosis not present

## 2015-04-15 DIAGNOSIS — K8040 Calculus of bile duct with cholecystitis, unspecified, without obstruction: Secondary | ICD-10-CM | POA: Diagnosis not present

## 2015-04-15 DIAGNOSIS — K8044 Calculus of bile duct with chronic cholecystitis without obstruction: Secondary | ICD-10-CM

## 2015-04-15 DIAGNOSIS — I10 Essential (primary) hypertension: Secondary | ICD-10-CM | POA: Diagnosis not present

## 2015-04-15 NOTE — Telephone Encounter (Signed)
Pt daughter's returned call [phone]

## 2015-04-15 NOTE — Telephone Encounter (Signed)
Called spoke with daughter. She reports pt is currently at a nursing facility, [facility].  Pt was recently in hospital after her surgery. They have given pt new inhaler Symbicort (on pt med list as well).  Pt is scheduled 04/23/15 w/ MW for HFU. FYI for MW

## 2015-04-23 ENCOUNTER — Inpatient Hospital Stay: Payer: BC Managed Care – PPO | Admitting: Internal Medicine

## 2015-04-23 ENCOUNTER — Encounter: Payer: Self-pay | Admitting: Adult Health

## 2015-04-23 ENCOUNTER — Non-Acute Institutional Stay (SKILLED_NURSING_FACILITY): Payer: Medicare Other | Admitting: Adult Health

## 2015-04-23 DIAGNOSIS — I5032 Chronic diastolic (congestive) heart failure: Secondary | ICD-10-CM | POA: Diagnosis not present

## 2015-04-23 DIAGNOSIS — R0600 Dyspnea, unspecified: Secondary | ICD-10-CM | POA: Diagnosis not present

## 2015-04-23 DIAGNOSIS — K8040 Calculus of bile duct with cholecystitis, unspecified, without obstruction: Secondary | ICD-10-CM

## 2015-04-23 DIAGNOSIS — E119 Type 2 diabetes mellitus without complications: Secondary | ICD-10-CM

## 2015-04-23 DIAGNOSIS — E876 Hypokalemia: Secondary | ICD-10-CM | POA: Diagnosis not present

## 2015-04-23 DIAGNOSIS — E785 Hyperlipidemia, unspecified: Secondary | ICD-10-CM | POA: Diagnosis not present

## 2015-04-23 DIAGNOSIS — R5381 Other malaise: Secondary | ICD-10-CM

## 2015-04-23 DIAGNOSIS — K219 Gastro-esophageal reflux disease without esophagitis: Secondary | ICD-10-CM

## 2015-04-23 DIAGNOSIS — E43 Unspecified severe protein-calorie malnutrition: Secondary | ICD-10-CM

## 2015-04-23 DIAGNOSIS — N3281 Overactive bladder: Secondary | ICD-10-CM

## 2015-04-23 DIAGNOSIS — I10 Essential (primary) hypertension: Secondary | ICD-10-CM | POA: Diagnosis not present

## 2015-04-23 DIAGNOSIS — K8044 Calculus of bile duct with chronic cholecystitis without obstruction: Secondary | ICD-10-CM

## 2015-04-27 DIAGNOSIS — R2689 Other abnormalities of gait and mobility: Secondary | ICD-10-CM | POA: Diagnosis not present

## 2015-04-27 DIAGNOSIS — N189 Chronic kidney disease, unspecified: Secondary | ICD-10-CM | POA: Diagnosis not present

## 2015-04-27 DIAGNOSIS — I129 Hypertensive chronic kidney disease with stage 1 through stage 4 chronic kidney disease, or unspecified chronic kidney disease: Secondary | ICD-10-CM | POA: Diagnosis not present

## 2015-04-27 DIAGNOSIS — I5032 Chronic diastolic (congestive) heart failure: Secondary | ICD-10-CM | POA: Diagnosis not present

## 2015-04-27 DIAGNOSIS — E119 Type 2 diabetes mellitus without complications: Secondary | ICD-10-CM | POA: Diagnosis not present

## 2015-04-27 DIAGNOSIS — Z48815 Encounter for surgical aftercare following surgery on the digestive system: Secondary | ICD-10-CM | POA: Diagnosis not present

## 2015-04-28 DIAGNOSIS — E119 Type 2 diabetes mellitus without complications: Secondary | ICD-10-CM | POA: Diagnosis not present

## 2015-04-28 DIAGNOSIS — I129 Hypertensive chronic kidney disease with stage 1 through stage 4 chronic kidney disease, or unspecified chronic kidney disease: Secondary | ICD-10-CM | POA: Diagnosis not present

## 2015-04-28 DIAGNOSIS — Z48815 Encounter for surgical aftercare following surgery on the digestive system: Secondary | ICD-10-CM | POA: Diagnosis not present

## 2015-04-28 DIAGNOSIS — R2689 Other abnormalities of gait and mobility: Secondary | ICD-10-CM | POA: Diagnosis not present

## 2015-04-28 DIAGNOSIS — N189 Chronic kidney disease, unspecified: Secondary | ICD-10-CM | POA: Diagnosis not present

## 2015-04-28 DIAGNOSIS — I5032 Chronic diastolic (congestive) heart failure: Secondary | ICD-10-CM | POA: Diagnosis not present

## 2015-04-29 DIAGNOSIS — E780 Pure hypercholesterolemia: Secondary | ICD-10-CM | POA: Diagnosis not present

## 2015-04-29 DIAGNOSIS — F419 Anxiety disorder, unspecified: Secondary | ICD-10-CM | POA: Diagnosis not present

## 2015-04-29 DIAGNOSIS — I10 Essential (primary) hypertension: Secondary | ICD-10-CM | POA: Diagnosis not present

## 2015-04-29 DIAGNOSIS — E11329 Type 2 diabetes mellitus with mild nonproliferative diabetic retinopathy without macular edema: Secondary | ICD-10-CM | POA: Diagnosis not present

## 2015-04-30 DIAGNOSIS — E119 Type 2 diabetes mellitus without complications: Secondary | ICD-10-CM | POA: Diagnosis not present

## 2015-04-30 DIAGNOSIS — R2689 Other abnormalities of gait and mobility: Secondary | ICD-10-CM | POA: Diagnosis not present

## 2015-04-30 DIAGNOSIS — I5032 Chronic diastolic (congestive) heart failure: Secondary | ICD-10-CM | POA: Diagnosis not present

## 2015-04-30 DIAGNOSIS — N189 Chronic kidney disease, unspecified: Secondary | ICD-10-CM | POA: Diagnosis not present

## 2015-04-30 DIAGNOSIS — I129 Hypertensive chronic kidney disease with stage 1 through stage 4 chronic kidney disease, or unspecified chronic kidney disease: Secondary | ICD-10-CM | POA: Diagnosis not present

## 2015-04-30 DIAGNOSIS — Z48815 Encounter for surgical aftercare following surgery on the digestive system: Secondary | ICD-10-CM | POA: Diagnosis not present

## 2015-05-03 ENCOUNTER — Encounter: Payer: BC Managed Care – PPO | Admitting: Adult Health

## 2015-05-04 DIAGNOSIS — R2689 Other abnormalities of gait and mobility: Secondary | ICD-10-CM | POA: Diagnosis not present

## 2015-05-04 DIAGNOSIS — Z48815 Encounter for surgical aftercare following surgery on the digestive system: Secondary | ICD-10-CM | POA: Diagnosis not present

## 2015-05-04 DIAGNOSIS — E119 Type 2 diabetes mellitus without complications: Secondary | ICD-10-CM | POA: Diagnosis not present

## 2015-05-04 DIAGNOSIS — I5032 Chronic diastolic (congestive) heart failure: Secondary | ICD-10-CM | POA: Diagnosis not present

## 2015-05-04 DIAGNOSIS — I129 Hypertensive chronic kidney disease with stage 1 through stage 4 chronic kidney disease, or unspecified chronic kidney disease: Secondary | ICD-10-CM | POA: Diagnosis not present

## 2015-05-04 DIAGNOSIS — N189 Chronic kidney disease, unspecified: Secondary | ICD-10-CM | POA: Diagnosis not present

## 2015-05-06 DIAGNOSIS — Z48815 Encounter for surgical aftercare following surgery on the digestive system: Secondary | ICD-10-CM | POA: Diagnosis not present

## 2015-05-06 DIAGNOSIS — N189 Chronic kidney disease, unspecified: Secondary | ICD-10-CM | POA: Diagnosis not present

## 2015-05-06 DIAGNOSIS — I129 Hypertensive chronic kidney disease with stage 1 through stage 4 chronic kidney disease, or unspecified chronic kidney disease: Secondary | ICD-10-CM | POA: Diagnosis not present

## 2015-05-06 DIAGNOSIS — E119 Type 2 diabetes mellitus without complications: Secondary | ICD-10-CM | POA: Diagnosis not present

## 2015-05-06 DIAGNOSIS — R2689 Other abnormalities of gait and mobility: Secondary | ICD-10-CM | POA: Diagnosis not present

## 2015-05-06 DIAGNOSIS — I5032 Chronic diastolic (congestive) heart failure: Secondary | ICD-10-CM | POA: Diagnosis not present

## 2015-05-09 NOTE — Progress Notes (Signed)
Patient ID: Heidi Dominguez, female   DOB: Jun 10, 1921, 81 y.o.   MRN: [id number]       PCP: Mathews Argyle, MD   Allergies  Allergen Reactions  . Catapres [Clonidine Hcl] Other (See Comments)    Unknown.  Lorrin Goodell [Verapamil Hcl] Other (See Comments)    Unknown.  . Other Other (See Comments)    Any type of narcotics Feels "loopy"  . Sulfa Antibiotics Itching    Chief Complaint  Patient presents with  . New Admit To SNF    New Admission      HPI:  81 y.o. patient is here for short term rehabilitation post hospital admission with acute abdominal pain from gallstone pancreatitis. She had bowel rest, iv fluids and pain medication followed by laproscopic cholecystectomy on 04/11/15. She is seen in her room today. She complaints of generalized weakness and some dizziness with position change. No toher concerns. She has PMH of diabetes mellitus type 2, hypertension, chronic diastolic CHF, hyperlipidemia and GERD.   Review of Systems:  Constitutional: Negative for fever, chills, diaphoresis.  HENT: Negative for headache, congestion, nasal discharge  Eyes: Negative for eye pain, blurred vision, double vision and discharge.  Respiratory: Negative for cough, shortness of breath. Has chronic wheezing.   Cardiovascular: Negative for chest pain, palpitations, leg swelling.  Gastrointestinal: Negative for heartburn, nausea, vomiting, abdominal pain. Had a bowel movement this am. Genitourinary: Negative for dysuria.  Musculoskeletal: Negative for back pain, falls Neurological: Negative for focal weakness Psychiatric/Behavioral: Negative for depression   Past Medical History  Diagnosis Date  . Diabetes mellitus   . Hypertension   . High cholesterol   . Osteoarthritis   . Degenerative joint disease of shoulder region   . HOH (hard of hearing)   . Complication of anesthesia     I have a hard time waking up"  . Chronic diastolic CHF (congestive heart failure)   . Pneumonia     . GERD (gastroesophageal reflux disease)    Past Surgical History  Procedure Laterality Date  . Shoulder surgery    . Knee surgery    . Back surgery    . Abdominal hysterectomy    . Cataracts      bilateral  . Laparoscopic cholecystectomy single site with intraoperative cholangiogram N/A 04/11/2015    Procedure: LAPAROSCOPIC LYSIS OF ADHESIONS, LAPAROSCOPIC CHOLECYSTECTOMY WITH INTRAOPERATIVE CHOLANGIOGRAM;  Surgeon: Michael Boston, MD;  Location: [facility];  Service: General;  Laterality: N/A;   Social History:   reports that she has never smoked. She has never used smokeless tobacco. She reports that she does not drink alcohol or use illicit drugs.  Family History  Problem Relation Age of Onset  . Hypertension Mother   . Diabetes Mellitus I Mother   . Hypertension Father     Medications:   Medication List       This list is accurate as of: 04/15/15 11:59 PM.  Always use your most recent med list.               acetaminophen 325 MG tablet  Commonly known as:  TYLENOL  Take 2 tablets (650 mg total) by mouth every 6 (six) hours as needed (or Fever >/= 101).     amLODipine 10 MG tablet  Commonly known as:  NORVASC  Take 1 tablet (10 mg total) by mouth daily.     aspirin 81 MG tablet  Take 81 mg by mouth daily.     benzonatate 100 MG capsule  Commonly known as:  TESSALON  Take by mouth 3 (three) times daily as needed for cough.     budesonide-formoterol 160-4.5 MCG/ACT inhaler  Commonly known as:  SYMBICORT  Inhale 2 puffs into the lungs 2 (two) times daily.     furosemide 20 MG tablet  Commonly known as:  LASIX  1 TABLET (20 MG) BY MOUTH DAILY     Iron 240 (27 FE) MG Tabs  Take 1 tablet by mouth daily.     JANUVIA 50 MG tablet  Generic drug:  sitaGLIPtin  1 TABLET (50 MG) BY MOUTH DAILY     levalbuterol 1.25 MG/3ML nebulizer solution  Commonly known as:  XOPENEX  Take 1.25 mg by nebulization every 4 (four) hours as needed for wheezing.     Magnesium 250 MG  Tabs  Take 250 mg by mouth daily.     MYRBETRIQ 50 MG Tb24 tablet  Generic drug:  mirabegron ER  Take 50 mg by mouth daily.     potassium chloride 10 MEQ tablet  Commonly known as:  K-DUR,KLOR-CON  Take 10 mEq by mouth daily.     pravastatin 40 MG tablet  Commonly known as:  PRAVACHOL  Take 40 mg by mouth daily.     PROCEL Powd  Take 1 scoop by mouth 2 (two) times daily. For 30 days starting 04/15/15     ranitidine 150 MG tablet  Commonly known as:  ZANTAC  Take 1 tablet (150 mg total) by mouth 2 (two) times daily.     traMADol 50 MG tablet  Commonly known as:  ULTRAM  Take 1-2 tablets (50-100 mg total) by mouth every 6 (six) hours as needed for moderate pain or severe pain.     VENTOLIN HFA 108 (90 BASE) MCG/ACT inhaler  Generic drug:  albuterol  INHALE 2 PUFFS Q 4 H PRN     vitamin B-12 500 MCG tablet  Commonly known as:  CYANOCOBALAMIN  Take 500 mcg by mouth daily.     Vitamin D3 5000 UNITS Caps  Take 1 capsule by mouth daily.         Physical Exam: Filed Vitals:   04/15/15 1111  BP: 126/48  Pulse: 59  Temp: 98 F (36.7 C)  TempSrc: Oral  Resp: 16  Height: 5\' 6"  (1.676 m)  Weight: 198 lb (89.812 kg)  SpO2: 99%    General- elderly female, in no acute distress Head- normocephalic, atraumatic Throat- moist mucus membrane Eyes- PERRLA, EOMI, no pallor, no icterus, no discharge, normal conjunctiva, normal sclera Neck- no cervical lymphadenopathy Cardiovascular- normal s1,s2, no murmurs, palpable dorsalis pedis and radial pulses, trace leg edema Respiratory- bilateral clear to auscultation, no wheeze, no rhonchi, no crackles, no use of accessory muscles Abdomen- bowel sounds present, soft, non tender Musculoskeletal- able to move all 4 extremities, generalized weakness Neurological- no focal deficit, alert and oriented to person, place and time Skin- warm and dry, abdominal wall surgical incision healing well Psychiatry- normal mood and affect    Labs  reviewed: Basic Metabolic Panel:  Recent Labs  04/10/15 0541 04/11/15 0620 04/12/15 0504  NA 139 140 137  K 3.5 3.9 4.1  CL 105 106 102  CO2 27 26 30   GLUCOSE 115* 128* 136*  BUN 10 8 7   CREATININE 0.81 0.77 0.68  CALCIUM 9.3 9.5 9.0  MG  --   --  1.7   Liver Function Tests:  Recent Labs  04/10/15 0541 04/11/15 0620 04/12/15 0504  AST 36 23  70*  ALT 67* 55* 75*  ALKPHOS 73 75 88  BILITOT 1.0 0.8 0.9  PROT 6.2* 7.4 6.1*  ALBUMIN 3.0* 3.2* 2.7*    Recent Labs  04/10/15 0541 04/11/15 0620 04/12/15 0504  LIPASE 211* 141* 46   No results for input(s): AMMONIA in the last 8760 hours. CBC:  Recent Labs  04/11/15 0620 04/12/15 0504 04/13/15 0540  WBC 9.2 7.8 8.4  HGB 14.3 13.5 13.5  HCT 45.4 42.0 42.9  MCV 94.8 95.2 95.1  PLT 154 149* 138*   Cardiac Enzymes: No results for input(s): CKTOTAL, CKMB, CKMBINDEX, TROPONINI in the last 8760 hours. BNP: Invalid input(s): POCBNP CBG:  Recent Labs  04/12/15 2123 04/13/15 0734 04/13/15 1139  GLUCAP 162* 148* 183*    Radiological Exams: Dg Chest 2 View  04/08/2015   CLINICAL DATA:  Mid chest pain today, history of CHF, pneumonia, and diabetes  EXAM: CHEST  2 VIEW  COMPARISON:  Chest x-ray of March 31, 2015  FINDINGS: The lungs are adequately inflated and clear. There is no interstitial or alveolar edema. The heart is mildly enlarged. The central pulmonary vascularity is engorged. There is mild tortuosity of the descending thoracic aorta. There is no significant pleural effusion. There prosthetic shoulder joints bilaterally. The observed thoracic vertebral bodies are preserved in height.  IMPRESSION: Mild prominence of the cardiac silhouette and central pulmonary vascularity extension weighted by the AP projection. There may be low-grade compensated CHF however. There is no evidence of pneumonia.   Electronically Signed   By: David  Martinique M.D.   On: 04/08/2015 16:57   US Abdomen Limited Ruq  04/08/2015    CLINICAL DATA:  81 year old female with epigastric and right upper quadrant abdominal pain and elevated LFTs. Question gallstones.  EXAM: US ABDOMEN LIMITED - RIGHT UPPER QUADRANT  COMPARISON:  CT 08/07/2012  FINDINGS: Gallbladder:  Distended with 8 mm stone in the neck. No gallbladder wall thickening, wall thickness of 3 mm. No sonographic Murphy sign noted.  Common bile duct:  Diameter: 13.8 mm, distended. The more distal common bile duct is obscured.  Liver:  No focal lesion identified. Within normal limits in parenchymal echogenicity.  IMPRESSION: 1. Distended gallbladder with cholelithiasis, 8 mm stone in the neck. No secondary findings of cholecystitis. 2. Distended common bile duct, up to 13.8 mm. However, this appears unchanged on comparison to prior CT of 2013.   Electronically Signed   By: Jeb Levering M.D.   On: 04/08/2015 00:28     Assessment/Plan  Generalized weakness From deconditioning. Will have her work with physical therapy and occupational therapy team to help with gait training and muscle strengthening exercises.fall precautions. Skin care. Encourage to be out of bed.   Acute pancreatitis From gall stone. S/p cholecystectomy. No abdominal pain. Continue tramadol prn for pain and monitor  Cholelithiasis with acute cholecystitis S/P laparoscopic cholecystectomy. Has follow up with general surgery on 04/26/15. continue tramadol 50 mg 1-2 tabs q6h prn pain  Chronic wheezing recently taken off ACEI. Pending pulmonary work up. Continue Symbicort bid and prn xopenex for now.  Protein calorie malnutrition Continue procel supplement and monitor weight and po intake  Essential hypertension  Stable on Norvasc 10 mg daily, monitor bp  Chronic diastolic chf Continue lasix 20 mg daily, monitor bmp, continue kcl supplement  Diabetes mellitus Monitor cbg, continue Januvia 50 mg daily with pravastatin  Overactive bladder continue Myrbetriq 50 mg daily   Goals of care: short  term rehabilitation   Labs/tests ordered: cbc,  cmp  Family/ staff Communication: reviewed care plan with patient and nursing supervisor    Blanchie Serve, MD  [facility] [phone] (Monday-Friday 8 am - 5 pm) [phone] (afterhours)

## 2015-05-17 DIAGNOSIS — N189 Chronic kidney disease, unspecified: Secondary | ICD-10-CM | POA: Diagnosis not present

## 2015-05-17 DIAGNOSIS — I5032 Chronic diastolic (congestive) heart failure: Secondary | ICD-10-CM | POA: Diagnosis not present

## 2015-05-17 DIAGNOSIS — Z48815 Encounter for surgical aftercare following surgery on the digestive system: Secondary | ICD-10-CM | POA: Diagnosis not present

## 2015-05-17 DIAGNOSIS — E119 Type 2 diabetes mellitus without complications: Secondary | ICD-10-CM | POA: Diagnosis not present

## 2015-05-17 DIAGNOSIS — I129 Hypertensive chronic kidney disease with stage 1 through stage 4 chronic kidney disease, or unspecified chronic kidney disease: Secondary | ICD-10-CM | POA: Diagnosis not present

## 2015-05-17 DIAGNOSIS — R2689 Other abnormalities of gait and mobility: Secondary | ICD-10-CM | POA: Diagnosis not present

## 2015-05-18 DIAGNOSIS — H353212 Exudative age-related macular degeneration, right eye, with inactive choroidal neovascularization: Secondary | ICD-10-CM | POA: Diagnosis not present

## 2015-05-19 DIAGNOSIS — I129 Hypertensive chronic kidney disease with stage 1 through stage 4 chronic kidney disease, or unspecified chronic kidney disease: Secondary | ICD-10-CM | POA: Diagnosis not present

## 2015-05-19 DIAGNOSIS — R2689 Other abnormalities of gait and mobility: Secondary | ICD-10-CM | POA: Diagnosis not present

## 2015-05-19 DIAGNOSIS — E119 Type 2 diabetes mellitus without complications: Secondary | ICD-10-CM | POA: Diagnosis not present

## 2015-05-19 DIAGNOSIS — Z48815 Encounter for surgical aftercare following surgery on the digestive system: Secondary | ICD-10-CM | POA: Diagnosis not present

## 2015-05-19 DIAGNOSIS — N189 Chronic kidney disease, unspecified: Secondary | ICD-10-CM | POA: Diagnosis not present

## 2015-05-19 DIAGNOSIS — I5032 Chronic diastolic (congestive) heart failure: Secondary | ICD-10-CM | POA: Diagnosis not present

## 2015-05-21 DIAGNOSIS — N189 Chronic kidney disease, unspecified: Secondary | ICD-10-CM | POA: Diagnosis not present

## 2015-05-21 DIAGNOSIS — E119 Type 2 diabetes mellitus without complications: Secondary | ICD-10-CM | POA: Diagnosis not present

## 2015-05-21 DIAGNOSIS — I129 Hypertensive chronic kidney disease with stage 1 through stage 4 chronic kidney disease, or unspecified chronic kidney disease: Secondary | ICD-10-CM | POA: Diagnosis not present

## 2015-05-21 DIAGNOSIS — I5032 Chronic diastolic (congestive) heart failure: Secondary | ICD-10-CM | POA: Diagnosis not present

## 2015-05-21 DIAGNOSIS — R2689 Other abnormalities of gait and mobility: Secondary | ICD-10-CM | POA: Diagnosis not present

## 2015-05-21 DIAGNOSIS — Z48815 Encounter for surgical aftercare following surgery on the digestive system: Secondary | ICD-10-CM | POA: Diagnosis not present

## 2015-05-26 DIAGNOSIS — N189 Chronic kidney disease, unspecified: Secondary | ICD-10-CM | POA: Diagnosis not present

## 2015-05-26 DIAGNOSIS — E119 Type 2 diabetes mellitus without complications: Secondary | ICD-10-CM | POA: Diagnosis not present

## 2015-05-26 DIAGNOSIS — Z48815 Encounter for surgical aftercare following surgery on the digestive system: Secondary | ICD-10-CM | POA: Diagnosis not present

## 2015-05-26 DIAGNOSIS — R2689 Other abnormalities of gait and mobility: Secondary | ICD-10-CM | POA: Diagnosis not present

## 2015-05-26 DIAGNOSIS — I5032 Chronic diastolic (congestive) heart failure: Secondary | ICD-10-CM | POA: Diagnosis not present

## 2015-05-26 DIAGNOSIS — I129 Hypertensive chronic kidney disease with stage 1 through stage 4 chronic kidney disease, or unspecified chronic kidney disease: Secondary | ICD-10-CM | POA: Diagnosis not present

## 2015-05-28 DIAGNOSIS — R2689 Other abnormalities of gait and mobility: Secondary | ICD-10-CM | POA: Diagnosis not present

## 2015-05-28 DIAGNOSIS — Z48815 Encounter for surgical aftercare following surgery on the digestive system: Secondary | ICD-10-CM | POA: Diagnosis not present

## 2015-05-28 DIAGNOSIS — N189 Chronic kidney disease, unspecified: Secondary | ICD-10-CM | POA: Diagnosis not present

## 2015-05-28 DIAGNOSIS — I129 Hypertensive chronic kidney disease with stage 1 through stage 4 chronic kidney disease, or unspecified chronic kidney disease: Secondary | ICD-10-CM | POA: Diagnosis not present

## 2015-05-28 DIAGNOSIS — I5032 Chronic diastolic (congestive) heart failure: Secondary | ICD-10-CM | POA: Diagnosis not present

## 2015-05-28 DIAGNOSIS — E119 Type 2 diabetes mellitus without complications: Secondary | ICD-10-CM | POA: Diagnosis not present

## 2015-06-02 ENCOUNTER — Encounter: Payer: BC Managed Care – PPO | Admitting: Adult Health

## 2015-06-07 DIAGNOSIS — E119 Type 2 diabetes mellitus without complications: Secondary | ICD-10-CM | POA: Diagnosis not present

## 2015-06-07 DIAGNOSIS — N189 Chronic kidney disease, unspecified: Secondary | ICD-10-CM | POA: Diagnosis not present

## 2015-06-07 DIAGNOSIS — R2689 Other abnormalities of gait and mobility: Secondary | ICD-10-CM | POA: Diagnosis not present

## 2015-06-07 DIAGNOSIS — Z48815 Encounter for surgical aftercare following surgery on the digestive system: Secondary | ICD-10-CM | POA: Diagnosis not present

## 2015-06-07 DIAGNOSIS — I5032 Chronic diastolic (congestive) heart failure: Secondary | ICD-10-CM | POA: Diagnosis not present

## 2015-06-07 DIAGNOSIS — I129 Hypertensive chronic kidney disease with stage 1 through stage 4 chronic kidney disease, or unspecified chronic kidney disease: Secondary | ICD-10-CM | POA: Diagnosis not present

## 2015-06-08 DIAGNOSIS — N189 Chronic kidney disease, unspecified: Secondary | ICD-10-CM | POA: Diagnosis not present

## 2015-06-08 DIAGNOSIS — E119 Type 2 diabetes mellitus without complications: Secondary | ICD-10-CM | POA: Diagnosis not present

## 2015-06-08 DIAGNOSIS — I5032 Chronic diastolic (congestive) heart failure: Secondary | ICD-10-CM | POA: Diagnosis not present

## 2015-06-08 DIAGNOSIS — Z48815 Encounter for surgical aftercare following surgery on the digestive system: Secondary | ICD-10-CM | POA: Diagnosis not present

## 2015-06-08 DIAGNOSIS — I129 Hypertensive chronic kidney disease with stage 1 through stage 4 chronic kidney disease, or unspecified chronic kidney disease: Secondary | ICD-10-CM | POA: Diagnosis not present

## 2015-06-08 DIAGNOSIS — R2689 Other abnormalities of gait and mobility: Secondary | ICD-10-CM | POA: Diagnosis not present

## 2015-06-10 DIAGNOSIS — Z23 Encounter for immunization: Secondary | ICD-10-CM | POA: Diagnosis not present

## 2015-06-14 ENCOUNTER — Encounter: Payer: BC Managed Care – PPO | Admitting: Adult Health

## 2015-06-17 DIAGNOSIS — I10 Essential (primary) hypertension: Secondary | ICD-10-CM | POA: Diagnosis not present

## 2015-06-17 DIAGNOSIS — Z79899 Other long term (current) drug therapy: Secondary | ICD-10-CM | POA: Diagnosis not present

## 2015-06-17 DIAGNOSIS — J309 Allergic rhinitis, unspecified: Secondary | ICD-10-CM | POA: Diagnosis not present

## 2015-06-17 DIAGNOSIS — H9319 Tinnitus, unspecified ear: Secondary | ICD-10-CM | POA: Diagnosis not present

## 2015-06-17 DIAGNOSIS — E113299 Type 2 diabetes mellitus with mild nonproliferative diabetic retinopathy without macular edema, unspecified eye: Secondary | ICD-10-CM | POA: Diagnosis not present

## 2015-06-17 DIAGNOSIS — G4700 Insomnia, unspecified: Secondary | ICD-10-CM | POA: Diagnosis not present

## 2015-06-17 DIAGNOSIS — E119 Type 2 diabetes mellitus without complications: Secondary | ICD-10-CM | POA: Diagnosis not present

## 2015-06-22 DIAGNOSIS — Z1231 Encounter for screening mammogram for malignant neoplasm of breast: Secondary | ICD-10-CM | POA: Diagnosis not present

## 2015-06-23 ENCOUNTER — Ambulatory Visit: Payer: Medicare Other | Admitting: Podiatry

## 2015-07-02 DIAGNOSIS — R109 Unspecified abdominal pain: Secondary | ICD-10-CM | POA: Diagnosis not present

## 2015-07-02 DIAGNOSIS — J069 Acute upper respiratory infection, unspecified: Secondary | ICD-10-CM | POA: Diagnosis not present

## 2015-07-10 NOTE — Progress Notes (Signed)
Patient ID: Heidi Dominguez, female   DOB: Jun 15, 1921, 81 y.o.   MRN: [id number]    DATE:  04/23/15  MRN:  [id number]  BIRTHDAY: [birth year]  Facility:  [facility]ursing Home Location:  [place] Room Number: [id number]  LEVEL OF CARE:  [id number]  Contact Information    Name [place] [facility] [phone]   [facility], [place] [phone]  [phone]   [place], [place]   [phone]      Chief Complaint  Patient presents with  . Discharge Note    Physical deconditioning, cholelithiasis/chronic cholecystitis S/P laparoscope peak cholecystectomy, chronic dyspnea, essential hypertension, diabetes mellitus, hyperlipidemia, chronic diastolic CHF, overactive bladder, hypokalemia, GERD and protein calorie malnutrition    HISTORY OF PRESENT ILLNESS:  This is a 81 year old female who is for discharge home with Home health skilled Nurse for disease management, CNA for showers, PT for endurance and OT for ADLs. DME: Standard wheelchair with leg rests, anti-tippers and cushion. She has been admitted to [facility] on 04/13/15 from [facility]. She has PMH of diabetes mellitus type 2, hypertension, chronic diastolic CHF, hyperlipidemia and GERD. She was having abdominal pain and was diagnosed with acute gallstone pancreatitis. She was initially treated with supportive management - bowel rest, IV fluids and pain management. She then had laparoscopic cholecystectomy on 04/11/15.  Patient was admitted to this facility for short-term rehabilitation after the patient's recent hospitalization.  Patient has completed SNF rehabilitation and therapy has cleared the patient for discharge.  PAST MEDICAL HISTORY:  Past Medical History  Diagnosis Date  . Diabetes mellitus   . Hypertension   . High cholesterol   . Osteoarthritis   . Degenerative joint disease of shoulder region   . HOH (hard of hearing)   .  Complication of anesthesia     I have a hard time waking up"  . Chronic diastolic CHF (congestive heart failure)   . Pneumonia   . GERD (gastroesophageal reflux disease)      CURRENT MEDICATIONS: Reviewed  Patient's Medications  New Prescriptions   No medications on file  Previous Medications   ACETAMINOPHEN (TYLENOL) 325 MG TABLET    Take 2 tablets (650 mg total) by mouth every 6 (six) hours as needed (or Fever >/= 101).   AMLODIPINE (NORVASC) 10 MG TABLET    Take 1 tablet (10 mg total) by mouth daily.   ASPIRIN 81 MG TABLET    Take 81 mg by mouth daily.   BENZONATATE (TESSALON) 100 MG CAPSULE    Take by mouth 3 (three) times daily as needed for cough.   BUDESONIDE-FORMOTEROL (SYMBICORT) 160-4.5 MCG/ACT INHALER    Inhale 2 puffs into the lungs 2 (two) times daily.   CHOLECALCIFEROL (VITAMIN D3) 5000 UNITS CAPS    Take 1 capsule by mouth daily.   FERROUS GLUCONATE (IRON) 240 (27 FE) MG TABS    Take 1 tablet by mouth daily.   FUROSEMIDE (LASIX) 20 MG TABLET    1 TABLET (20 MG) BY MOUTH DAILY   JANUVIA 50 MG TABLET    1 TABLET (50 MG) BY MOUTH DAILY   LEVALBUTEROL (XOPENEX) 1.25 MG/3ML NEBULIZER SOLUTION    Take 1.25 mg by nebulization every 4 (four) hours as needed for wheezing.   MAGNESIUM 250 MG TABS    Take 250 mg by mouth daily.    MIRABEGRON ER (MYRBETRIQ) 50 MG TB24 TABLET    Take 50 mg by mouth daily.  POTASSIUM CHLORIDE (K-DUR,KLOR-CON) 10 MEQ TABLET    Take 10 mEq by mouth daily.    PRAVASTATIN (PRAVACHOL) 40 MG TABLET    Take 40 mg by mouth daily.   PROTEIN (PROCEL) POWD    Take 1 scoop by mouth 2 (two) times daily. For 30 days starting 04/15/15   RANITIDINE (ZANTAC) 150 MG TABLET    Take 1 tablet (150 mg total) by mouth 2 (two) times daily.   TRAMADOL (ULTRAM) 50 MG TABLET    Take 1-2 tablets (50-100 mg total) by mouth every 6 (six) hours as needed for moderate pain or severe pain.   VENTOLIN HFA 108 (90 BASE) MCG/ACT INHALER    INHALE 2 PUFFS Q 4 H PRN   VITAMIN B-12  (CYANOCOBALAMIN) 500 MCG TABLET    Take 500 mcg by mouth daily.  Modified Medications   No medications on file  Discontinued Medications   No medications on file     Allergies  Allergen Reactions  . Catapres [Clonidine Hcl] Other (See Comments)    Unknown.  Lorrin Goodell [Verapamil Hcl] Other (See Comments)    Unknown.  . Other Other (See Comments)    Any type of narcotics Feels "loopy"  . Sulfa Antibiotics Itching     REVIEW OF SYSTEMS:  GENERAL: no change in appetite, no fatigue, no weight changes, no fever, chills or weakness EYES: Denies change in vision, dry eyes, eye pain, itching or discharge EARS: Denies change in hearing, ringing in ears, or earache NOSE: Denies nasal congestion or epistaxis MOUTH and THROAT: Denies oral discomfort, gingival pain or bleeding, pain from teeth or hoarseness   RESPIRATORY: no cough, SOB, DOE, wheezing, hemoptysis CARDIAC: no chest pain, edema or palpitations GI: no abdominal pain, diarrhea, constipation, heart burn, nausea or vomiting GU: Denies dysuria, frequency, hematuria, incontinence, or discharge PSYCHIATRIC: Denies feeling of depression or anxiety. No report of hallucinations, insomnia, paranoia, or agitation   PHYSICAL EXAMINATION  GENERAL APPEARANCE: Well nourished. In no acute distress. Obese SKIN:  3 laparoscopic sites on upper quadrant and 1 on umbilical area, dry, no erythema HEAD: Normal in size and contour. No evidence of trauma EYES: Lids open and close normally. No blepharitis, entropion or ectropion. PERRL. Conjunctivae are clear and sclerae are white. Lenses are without opacity EARS: Pinnae are normal. Patient hears normal voice tunes of the examiner MOUTH and THROAT: Lips are without lesions. Oral mucosa is moist and without lesions. Tongue is normal in shape, size, and color and without lesions NECK: supple, trachea midline, no neck masses, no thyroid tenderness, no thyromegaly LYMPHATICS: no LAN in the neck, no  supraclavicular LAN RESPIRATORY: breathing is even & unlabored, BS CTAB CARDIAC: RRR, no murmur,no extra heart sounds, no edema GI: abdomen soft, normal BS, no masses, no tenderness, no hepatomegaly, no splenomegaly EXTREMITIES:  Able to move 4 extremities PSYCHIATRIC: Alert and oriented X 3. Affect and behavior are appropriate  LABS/RADIOLOGY: Labs reviewed: 04/22/15  sodium 143 potassium 4.2 glucose 180 BUN 12 creatinine 0.81 calcium 9.3 WBC 8.9 hemoglobin 13.4 hematocrit 43.6 MCV 94.8 platelet 256 04/15/15  sodium 139 potassium 4.4 glucose 166 BUN 16 creatinine 0.88 calcium 9.4 total protein 5.9 albumin 3.03 globulin 2.9 total bilirubin 0.41 alkaline phosphatase 79 SGOT 14 SGPT 29 WBC 8.5 hemoglobin 13.2 MCV 96.4 platelet [id number] Basic Metabolic Panel:  Recent Labs  04/10/15 0541 04/11/15 0620 04/12/15 0504  NA 139 140 137  K 3.5 3.9 4.1  CL 105 106 102  CO2 27 26 30  GLUCOSE 115* 128* 136*  BUN 10 8 7   CREATININE 0.81 0.77 0.68  CALCIUM 9.3 9.5 9.0  MG  --   --  1.7   Liver Function Tests:  Recent Labs  04/10/15 0541 04/11/15 0620 04/12/15 0504  AST 36 23 70*  ALT 67* 55* 75*  ALKPHOS 73 75 88  BILITOT 1.0 0.8 0.9  PROT 6.2* 7.4 6.1*  ALBUMIN 3.0* 3.2* 2.7*    Recent Labs  04/10/15 0541 04/11/15 0620 04/12/15 0504  LIPASE 211* 141* 46   CBC:  Recent Labs  04/11/15 0620 04/12/15 0504 04/13/15 0540  WBC 9.2 7.8 8.4  HGB 14.3 13.5 13.5  HCT 45.4 42.0 42.9  MCV 94.8 95.2 95.1  PLT 154 149* 138*   A1C: Invalid input(s): A1C Lipid Panel:  Recent Labs  04/08/15 0800  HDL 62   CBG:  Recent Labs  04/12/15 2123 04/13/15 0734 04/13/15 1139  GLUCAP 162* 148* 183*     No results found.  ASSESSMENT/PLAN:  Physical deconditioning - for home health skilled nurse, CNA, PT and OT   Cholelithiasis with chronic cholecystitis nonobstructive cholelithiasis S/P laparoscopic cholecystectomy - follow-up with Dr. Michael Boston, Gen. surgery, on 04/26/15;  continue tramadol 50 mg 1-2 tabs by mouth every 6 hours when necessary for pain  Chronic dyspnea - continue Symbicort 160-4 0.5 g/ACT inhaler 2 puffs into the lungs twice a day, Xopenex when necessary and ventilating HFA when necessary; follow-up with Dr.Wert, pulmonology, on 04/23/15  Essential hypertension - continue Norvasc 10 mg 1 tab by mouth daily  Diabetes mellitus, type II - continue Januvia 50 mg 1 tab by mouth daily  Hyperlipidemia - continue pravastatin 40 mg 1 tab by mouth daily  Chronic diastolic CHF - stable; continue Lasix 20 mg 1 tab by mouth daily  Overactive bladder - continue Myrbetriq 50 mg 24-hour 1 tab by mouth daily  Hypokalemia - K4.1; continue KCl 10 meq 1 tab by mouth daily  GERD - continue Zantac 150 mg 1 tab by mouth twice a day  Protein calorie malnutrition, severe - albumin 2.7; continue supplementation     I have filled out patient's discharge paperwork and written prescriptions.  Patient will receive home health PT, OT, skilled Nurse and CNA.  DME provided:   Standard wheelchair with leg rests, anti-tippers and cushion  Total discharge time: Greater than 30 minutes  Discharge time involved coordination of the discharge process with social worker, nursing staff and therapy department. Medical justification for home health services/DME verified.     [facility], NP [employer] [phone]

## 2015-07-13 DIAGNOSIS — H353212 Exudative age-related macular degeneration, right eye, with inactive choroidal neovascularization: Secondary | ICD-10-CM | POA: Diagnosis not present

## 2015-07-14 ENCOUNTER — Ambulatory Visit (INDEPENDENT_AMBULATORY_CARE_PROVIDER_SITE_OTHER): Payer: Medicare Other | Admitting: Podiatry

## 2015-07-14 DIAGNOSIS — B351 Tinea unguium: Secondary | ICD-10-CM | POA: Diagnosis not present

## 2015-07-14 DIAGNOSIS — M79673 Pain in unspecified foot: Secondary | ICD-10-CM

## 2015-07-14 DIAGNOSIS — E1140 Type 2 diabetes mellitus with diabetic neuropathy, unspecified: Secondary | ICD-10-CM | POA: Diagnosis not present

## 2015-07-14 NOTE — Progress Notes (Signed)
Patient ID: Heidi Dominguez, female   DOB: 07/14/1921, 81 y.o.   MRN: [id number] Complaint:  Visit Type: Patient returns to my office for continued preventative foot care services. Complaint: Patient states" my nails have grown long and thick and become painful to walk and wear shoes" Patient has been diagnosed with DM with no foot complications. The patient presents for preventative foot care services. No changes to ROS  Podiatric Exam: Vascular: dorsalis pedis and posterior tibial pulses are palpable bilateral. Capillary return is immediate. Temperature gradient is WNL. Skin turgor WNL  Sensorium: Normal Semmes Weinstein monofilament test. Normal tactile sensation bilaterally. Nail Exam: Pt has thick disfigured discolored nails with subungual debris noted bilateral entire nail hallux through fifth toenails Ulcer Exam: There is no evidence of ulcer or pre-ulcerative changes or infection. Orthopedic Exam: Muscle tone and strength are WNL. No limitations in general ROM. No crepitus or effusions noted. Foot type and digits show no abnormalities. Bony prominences are unremarkable. Severe HAV 1st MPJ B/l Skin: No Porokeratosis. No infection or ulcers.  Callus sub 1 due to severe HAV.  Diagnosis:  Onychomycosis, , Pain in right toe, pain in left toes  Treatment & Plan Procedures and Treatment: Consent by patient was obtained for treatment procedures. The patient understood the discussion of treatment and procedures well. All questions were answered thoroughly reviewed. Debridement of mycotic and hypertrophic toenails, 1 through 5 bilateral and clearing of subungual debris. No ulceration, no infection noted.  Initiate diabetic shoes this visit. Return Visit-Office Procedure: Patient instructed to return to the office for a follow up visit 3 months for continued evaluation and treatment.  Gardiner Barefoot DPM

## 2015-09-07 DIAGNOSIS — H353212 Exudative age-related macular degeneration, right eye, with inactive choroidal neovascularization: Secondary | ICD-10-CM | POA: Diagnosis not present

## 2015-09-14 ENCOUNTER — Ambulatory Visit: Payer: Medicare Other | Admitting: *Deleted

## 2015-09-14 DIAGNOSIS — E1140 Type 2 diabetes mellitus with diabetic neuropathy, unspecified: Secondary | ICD-10-CM

## 2015-09-14 NOTE — Progress Notes (Signed)
Patient ID: Heidi Dominguez, female   DOB: 09/19/1920, 82 y.o.   MRN: [id number] Patient presents to be scanned and measured for diabetic shoes and inserts.

## 2015-09-15 DIAGNOSIS — H5211 Myopia, right eye: Secondary | ICD-10-CM | POA: Diagnosis not present

## 2015-09-15 DIAGNOSIS — H5202 Hypermetropia, left eye: Secondary | ICD-10-CM | POA: Diagnosis not present

## 2015-09-15 DIAGNOSIS — Z961 Presence of intraocular lens: Secondary | ICD-10-CM | POA: Diagnosis not present

## 2015-09-15 DIAGNOSIS — H353212 Exudative age-related macular degeneration, right eye, with inactive choroidal neovascularization: Secondary | ICD-10-CM | POA: Diagnosis not present

## 2015-10-06 DIAGNOSIS — H6123 Impacted cerumen, bilateral: Secondary | ICD-10-CM | POA: Diagnosis not present

## 2015-10-06 DIAGNOSIS — H9313 Tinnitus, bilateral: Secondary | ICD-10-CM | POA: Insufficient documentation

## 2015-10-06 DIAGNOSIS — J342 Deviated nasal septum: Secondary | ICD-10-CM | POA: Diagnosis not present

## 2015-10-06 DIAGNOSIS — D3705 Neoplasm of uncertain behavior of pharynx: Secondary | ICD-10-CM | POA: Insufficient documentation

## 2015-10-06 DIAGNOSIS — H9193 Unspecified hearing loss, bilateral: Secondary | ICD-10-CM | POA: Insufficient documentation

## 2015-10-06 DIAGNOSIS — H9113 Presbycusis, bilateral: Secondary | ICD-10-CM | POA: Diagnosis not present

## 2015-10-07 ENCOUNTER — Other Ambulatory Visit: Payer: Self-pay | Admitting: Otolaryngology

## 2015-10-07 DIAGNOSIS — D3705 Neoplasm of uncertain behavior of pharynx: Secondary | ICD-10-CM

## 2015-10-13 ENCOUNTER — Ambulatory Visit (INDEPENDENT_AMBULATORY_CARE_PROVIDER_SITE_OTHER): Payer: Medicare Other | Admitting: Podiatry

## 2015-10-13 DIAGNOSIS — M79673 Pain in unspecified foot: Secondary | ICD-10-CM

## 2015-10-13 DIAGNOSIS — B351 Tinea unguium: Secondary | ICD-10-CM | POA: Diagnosis not present

## 2015-10-13 DIAGNOSIS — M2010 Hallux valgus (acquired), unspecified foot: Secondary | ICD-10-CM | POA: Diagnosis not present

## 2015-10-13 DIAGNOSIS — M2040 Other hammer toe(s) (acquired), unspecified foot: Secondary | ICD-10-CM

## 2015-10-13 DIAGNOSIS — E1140 Type 2 diabetes mellitus with diabetic neuropathy, unspecified: Secondary | ICD-10-CM | POA: Diagnosis not present

## 2015-10-13 DIAGNOSIS — Z1211 Encounter for screening for malignant neoplasm of colon: Secondary | ICD-10-CM | POA: Diagnosis not present

## 2015-10-13 DIAGNOSIS — R195 Other fecal abnormalities: Secondary | ICD-10-CM | POA: Diagnosis not present

## 2015-10-13 NOTE — Progress Notes (Signed)
Patient ID: Heidi Dominguez, female   DOB: 12/28/1920, 82 y.o.   MRN: [id number] Complaint:  Visit Type: Patient returns to my office for continued preventative foot care services. Complaint: Patient states" my nails have grown long and thick and become painful to walk and wear shoes" Patient has been diagnosed with DM with no foot complications. The patient presents for preventative foot care services. No changes to ROS.  Patient also presents for picking up her diabetic shoes.  Podiatric Exam: Vascular: dorsalis pedis and posterior tibial pulses are palpable bilateral. Capillary return is immediate. Temperature gradient is WNL. Skin turgor WNL  Sensorium: Normal Semmes Weinstein monofilament test. Normal tactile sensation bilaterally. Nail Exam: Pt has thick disfigured discolored nails with subungual debris noted bilateral entire nail hallux through fifth toenails Ulcer Exam: There is no evidence of ulcer or pre-ulcerative changes or infection. Orthopedic Exam: Muscle tone and strength are WNL. No limitations in general ROM. No crepitus or effusions noted. Foot type and digits show no abnormalities. Bony prominences are unremarkable. Severe HAV 1st MPJ B/l Skin: No Porokeratosis. No infection or ulcers.  Callus sub 1 due to severe HAV.  Diagnosis:  Onychomycosis, , Pain in right toe, pain in left toes,  HAV B/L  Treatment & Plan Procedures and Treatment: Consent by patient was obtained for treatment procedures. The patient understood the discussion of treatment and procedures well. All questions were answered thoroughly reviewed. Debridement of mycotic and hypertrophic toenails, 1 through 5 bilateral and clearing of subungual debris. No ulceration, no infection noted.Dispense diabetic shoes. Patient presents today and was dispensed 0ne pair ( two units) of medically necessary extra depth shoes with three pair( six units) of custom molded multiple density inserts. The shoes and the inserts are fitted  to the patients ' feet and are noted to fit well and are free of defect.  Length and width of the shoes are also acceptable.  Patient was given written and verbal  instructions for wearing.  If any concerns arrive with the shoes or inserts, the patient is to call the office.Patient is to follow up with doctor in six weeks. Return Visit-Office Procedure: Patient instructed to return to the office for a follow up visit 3 months for continued evaluation and treatment.  Gardiner Barefoot DPM

## 2015-10-15 ENCOUNTER — Ambulatory Visit
Admission: RE | Admit: 2015-10-15 | Discharge: 2015-10-15 | Disposition: A | Discharge status: Home / Self Care | Payer: Medicare Other | Source: Ambulatory Visit | Attending: Otolaryngology | Admitting: Otolaryngology

## 2015-10-15 DIAGNOSIS — R221 Localized swelling, mass and lump, neck: Secondary | ICD-10-CM | POA: Diagnosis not present

## 2015-10-15 DIAGNOSIS — D3705 Neoplasm of uncertain behavior of pharynx: Secondary | ICD-10-CM

## 2015-10-15 MED ORDER — IOPAMIDOL (ISOVUE-300) INJECTION 61%
75.0000 mL | Freq: Once | INTRAVENOUS | Status: AC | PRN
  Administered 2015-10-15: 75 mL via INTRAVENOUS

## 2015-10-25 ENCOUNTER — Other Ambulatory Visit: Payer: Self-pay | Admitting: Geriatric Medicine

## 2015-10-25 DIAGNOSIS — Z7984 Long term (current) use of oral hypoglycemic drugs: Secondary | ICD-10-CM | POA: Diagnosis not present

## 2015-10-25 DIAGNOSIS — E041 Nontoxic single thyroid nodule: Secondary | ICD-10-CM

## 2015-10-25 DIAGNOSIS — E119 Type 2 diabetes mellitus without complications: Secondary | ICD-10-CM | POA: Diagnosis not present

## 2015-10-28 ENCOUNTER — Ambulatory Visit
Admission: RE | Admit: 2015-10-28 | Discharge: 2015-10-28 | Disposition: A | Discharge status: Home / Self Care | Payer: Medicare Other | Source: Ambulatory Visit | Attending: Geriatric Medicine | Admitting: Geriatric Medicine

## 2015-10-28 DIAGNOSIS — E041 Nontoxic single thyroid nodule: Secondary | ICD-10-CM

## 2015-10-28 DIAGNOSIS — E049 Nontoxic goiter, unspecified: Secondary | ICD-10-CM | POA: Diagnosis not present

## 2015-11-02 DIAGNOSIS — H353211 Exudative age-related macular degeneration, right eye, with active choroidal neovascularization: Secondary | ICD-10-CM | POA: Diagnosis not present

## 2015-11-04 ENCOUNTER — Other Ambulatory Visit: Payer: Self-pay | Admitting: Geriatric Medicine

## 2015-11-04 DIAGNOSIS — E041 Nontoxic single thyroid nodule: Secondary | ICD-10-CM

## 2015-11-09 ENCOUNTER — Ambulatory Visit
Admission: RE | Admit: 2015-11-09 | Discharge: 2015-11-09 | Disposition: A | Discharge status: Home / Self Care | Payer: Medicare Other | Source: Ambulatory Visit | Attending: Geriatric Medicine | Admitting: Geriatric Medicine

## 2015-11-09 ENCOUNTER — Other Ambulatory Visit (HOSPITAL_COMMUNITY)
Admission: RE | Admit: 2015-11-09 | Discharge: 2015-11-09 | Disposition: A | Discharge status: Home / Self Care | Payer: Medicare Other | Source: Ambulatory Visit | Attending: General Surgery | Admitting: General Surgery

## 2015-11-09 DIAGNOSIS — E041 Nontoxic single thyroid nodule: Secondary | ICD-10-CM

## 2015-11-09 DIAGNOSIS — E042 Nontoxic multinodular goiter: Secondary | ICD-10-CM | POA: Diagnosis not present

## 2015-12-28 DIAGNOSIS — H353211 Exudative age-related macular degeneration, right eye, with active choroidal neovascularization: Secondary | ICD-10-CM | POA: Diagnosis not present

## 2016-01-12 ENCOUNTER — Ambulatory Visit (INDEPENDENT_AMBULATORY_CARE_PROVIDER_SITE_OTHER): Payer: Medicare Other | Admitting: Podiatry

## 2016-01-12 ENCOUNTER — Encounter: Payer: Self-pay | Admitting: Podiatry

## 2016-01-12 DIAGNOSIS — Q828 Other specified congenital malformations of skin: Secondary | ICD-10-CM

## 2016-01-12 DIAGNOSIS — B351 Tinea unguium: Secondary | ICD-10-CM | POA: Diagnosis not present

## 2016-01-12 DIAGNOSIS — E1140 Type 2 diabetes mellitus with diabetic neuropathy, unspecified: Secondary | ICD-10-CM | POA: Diagnosis not present

## 2016-01-12 DIAGNOSIS — M79673 Pain in unspecified foot: Secondary | ICD-10-CM | POA: Diagnosis not present

## 2016-01-12 NOTE — Progress Notes (Signed)
Patient ID: Heidi Dominguez, female   DOB: 01/18/1921, 82 y.o.   MRN: [id number] Complaint:  Visit Type: Patient returns to my office for continued preventative foot care services. Complaint: Patient states" my nails have grown long and thick and become painful to walk and wear shoes" Patient has been diagnosed with DM with no foot complications. The patient presents for preventative foot care services. No changes to ROS.  Her diabetic shoes are good. Painful callus under HAV left foot  Podiatric Exam: Vascular: dorsalis pedis and posterior tibial pulses are palpable bilateral. Capillary return is immediate. Temperature gradient is WNL. Skin turgor WNL  Sensorium: Normal Semmes Weinstein monofilament test. Normal tactile sensation bilaterally. Nail Exam: Pt has thick disfigured discolored nails with subungual debris noted bilateral entire nail hallux through fifth toenails Ulcer Exam: There is no evidence of ulcer or pre-ulcerative changes or infection. Orthopedic Exam: Muscle tone and strength are WNL. No limitations in general ROM. No crepitus or effusions noted. Foot type and digits show no abnormalities. Bony prominences are unremarkable. Severe HAV 1st MPJ B/l Skin: No Porokeratosis. No infection or ulcers.  Callus sub 1 due to severe HAV.  Diagnosis:  Onychomycosis, , Pain in right toe, pain in left toes, Callus left foot.  Treatment & Plan Procedures and Treatment: Consent by patient was obtained for treatment procedures. The patient understood the discussion of treatment and procedures well. All questions were answered thoroughly reviewed. Debridement of mycotic and hypertrophic toenails, 1 through 5 bilateral and clearing of subungual debris. No ulceration, no infection noted.  Add dispersion padding. Return Visit-Office Procedure: Patient instructed to return to the office for a follow up visit 3 months for continued evaluation and treatment.  Gardiner Barefoot DPM

## 2016-01-25 DIAGNOSIS — E113299 Type 2 diabetes mellitus with mild nonproliferative diabetic retinopathy without macular edema, unspecified eye: Secondary | ICD-10-CM | POA: Diagnosis not present

## 2016-01-25 DIAGNOSIS — R300 Dysuria: Secondary | ICD-10-CM | POA: Diagnosis not present

## 2016-01-25 DIAGNOSIS — R05 Cough: Secondary | ICD-10-CM | POA: Diagnosis not present

## 2016-01-25 DIAGNOSIS — I10 Essential (primary) hypertension: Secondary | ICD-10-CM | POA: Diagnosis not present

## 2016-01-25 DIAGNOSIS — K5901 Slow transit constipation: Secondary | ICD-10-CM | POA: Diagnosis not present

## 2016-02-21 DIAGNOSIS — H353211 Exudative age-related macular degeneration, right eye, with active choroidal neovascularization: Secondary | ICD-10-CM | POA: Diagnosis not present

## 2016-03-14 DIAGNOSIS — H903 Sensorineural hearing loss, bilateral: Secondary | ICD-10-CM | POA: Diagnosis not present

## 2016-03-14 DIAGNOSIS — R0982 Postnasal drip: Secondary | ICD-10-CM | POA: Diagnosis not present

## 2016-03-14 DIAGNOSIS — R05 Cough: Secondary | ICD-10-CM | POA: Diagnosis not present

## 2016-03-14 DIAGNOSIS — H9113 Presbycusis, bilateral: Secondary | ICD-10-CM | POA: Diagnosis not present

## 2016-03-14 DIAGNOSIS — H9311 Tinnitus, right ear: Secondary | ICD-10-CM | POA: Diagnosis not present

## 2016-03-25 ENCOUNTER — Encounter (HOSPITAL_COMMUNITY): Payer: Self-pay

## 2016-03-25 ENCOUNTER — Emergency Department (HOSPITAL_COMMUNITY): Payer: Medicare Other

## 2016-03-25 ENCOUNTER — Emergency Department (HOSPITAL_COMMUNITY)
Admission: EM | Admit: 2016-03-25 | Discharge: 2016-03-25 | Disposition: A | Discharge status: Home / Self Care | Payer: Medicare Other | Attending: Emergency Medicine | Admitting: Emergency Medicine

## 2016-03-25 DIAGNOSIS — Z7984 Long term (current) use of oral hypoglycemic drugs: Secondary | ICD-10-CM | POA: Insufficient documentation

## 2016-03-25 DIAGNOSIS — E119 Type 2 diabetes mellitus without complications: Secondary | ICD-10-CM | POA: Diagnosis not present

## 2016-03-25 DIAGNOSIS — Y929 Unspecified place or not applicable: Secondary | ICD-10-CM | POA: Insufficient documentation

## 2016-03-25 DIAGNOSIS — I5032 Chronic diastolic (congestive) heart failure: Secondary | ICD-10-CM | POA: Insufficient documentation

## 2016-03-25 DIAGNOSIS — R51 Headache: Secondary | ICD-10-CM | POA: Diagnosis not present

## 2016-03-25 DIAGNOSIS — W228XXA Striking against or struck by other objects, initial encounter: Secondary | ICD-10-CM | POA: Diagnosis not present

## 2016-03-25 DIAGNOSIS — Z79899 Other long term (current) drug therapy: Secondary | ICD-10-CM | POA: Diagnosis not present

## 2016-03-25 DIAGNOSIS — S0990XA Unspecified injury of head, initial encounter: Secondary | ICD-10-CM

## 2016-03-25 DIAGNOSIS — S0003XA Contusion of scalp, initial encounter: Secondary | ICD-10-CM | POA: Diagnosis not present

## 2016-03-25 DIAGNOSIS — Y999 Unspecified external cause status: Secondary | ICD-10-CM | POA: Insufficient documentation

## 2016-03-25 DIAGNOSIS — Y9301 Activity, walking, marching and hiking: Secondary | ICD-10-CM | POA: Insufficient documentation

## 2016-03-25 DIAGNOSIS — I110 Hypertensive heart disease with heart failure: Secondary | ICD-10-CM | POA: Diagnosis not present

## 2016-03-25 DIAGNOSIS — M542 Cervicalgia: Secondary | ICD-10-CM | POA: Insufficient documentation

## 2016-03-25 DIAGNOSIS — Z7982 Long term (current) use of aspirin: Secondary | ICD-10-CM | POA: Insufficient documentation

## 2016-03-25 DIAGNOSIS — S199XXA Unspecified injury of neck, initial encounter: Secondary | ICD-10-CM | POA: Diagnosis not present

## 2016-03-25 DIAGNOSIS — W19XXXA Unspecified fall, initial encounter: Secondary | ICD-10-CM

## 2016-03-25 NOTE — ED Notes (Signed)
Patient transported to CT 

## 2016-03-25 NOTE — Discharge Instructions (Signed)
X-ray of head and cervical spine show no fracture or blood clot. Rest. Ice pack. Return if worse.

## 2016-03-25 NOTE — ED Provider Notes (Signed)
[facility] DEPT Provider Note   CSN: [id number] Arrival date & time: 03/25/16  2040  First Provider Contact:  First MD Initiated Contact with Patient 03/25/16 2133        History   Chief Complaint Chief Complaint  Patient presents with  . Fall  . Head Injury    HPI Heidi Dominguez is a 82 y.o. female.  Accidental mechanical fall at 7:30 PM this evening. Patient complains of pain in the top of her head and slight pain in her neck. Both patient and family report no change in behavior or neurological deficits. Severity of symptoms mild. Nothing makes symptoms better or worse. No extremity or truncal pain.      Past Medical History:  Diagnosis Date  . Chronic diastolic CHF (congestive heart failure) ([place])   . Complication of anesthesia    I have a hard time waking up"  . Degenerative joint disease of shoulder region   . Diabetes mellitus   . GERD (gastroesophageal reflux disease)   . High cholesterol   . HOH (hard of hearing)   . Hypertension   . Osteoarthritis   . Pneumonia     Patient Active Problem List   Diagnosis Date Noted  . Choledocholithiasis with chronic cholecystitis, nonobstructing 04/11/2015  . Pre-operative cardiovascular examination, high risk surgery   . Epigastric abdominal pain 04/08/2015  . Acute gallstone pancreatitis s/p lap chole w [facility] 04/11/2015 04/08/2015  . Diabetes mellitus type 2, controlled ([place]) 04/08/2015  . Gallstone pancreatitis 04/08/2015  . Mitral regurgitation 04/08/2015  . Chronic diastolic CHF (congestive heart failure) ([place]) 04/08/2015  . Dyspnea 03/31/2015  . Ischemic colitis ([place]) 08/09/2012  . Colitis 08/07/2012  . Rectal bleeding 08/07/2012  . Weakness 09/24/2011  . Bradycardia 09/24/2011  . UTI (lower urinary tract infection) 09/24/2011  . Hematochezia 09/24/2011  . DM (diabetes mellitus) ([place]) 09/24/2011  . Essential hypertension 09/24/2011  . Dyslipidemia 09/24/2011    Past Surgical History:  Procedure  Laterality Date  . ABDOMINAL HYSTERECTOMY    . BACK SURGERY    . cataracts     bilateral  . KNEE SURGERY    . LAPAROSCOPIC CHOLECYSTECTOMY SINGLE SITE WITH INTRAOPERATIVE CHOLANGIOGRAM N/A 04/11/2015   Procedure: LAPAROSCOPIC LYSIS OF ADHESIONS, LAPAROSCOPIC CHOLECYSTECTOMY WITH INTRAOPERATIVE CHOLANGIOGRAM;  Surgeon: Michael Boston, MD;  Location: [facility] ORS;  Service: General;  Laterality: N/A;  . SHOULDER SURGERY      OB History    No data available       Home Medications    Prior to Admission medications   Medication Sig Start Date End Date Taking? Authorizing Provider  acetaminophen (TYLENOL) 325 MG tablet Take 2 tablets (650 mg total) by mouth every 6 (six) hours as needed (or Fever >/= 101). 08/12/12  Yes Nita Sells, MD  albuterol (PROVENTIL HFA;VENTOLIN HFA) 108 (90 Base) MCG/ACT inhaler Inhale 2 puffs into the lungs every 4 (four) hours as needed for wheezing or shortness of breath.   Yes Historical Provider, MD  amLODipine (NORVASC) 10 MG tablet Take 1 tablet (10 mg total) by mouth daily. 04/13/15  Yes Modena Jansky, MD  aspirin 81 MG tablet Take 81 mg by mouth daily.   Yes Historical Provider, MD  budesonide-formoterol (SYMBICORT) 160-4.5 MCG/ACT inhaler Inhale 2 puffs into the lungs 2 (two) times daily. 04/13/15  Yes Modena Jansky, MD  Cholecalciferol (VITAMIN D3) 5000 UNITS CAPS Take 1 capsule by mouth daily.   Yes Historical Provider, MD  Ferrous Gluconate (IRON) 240 (27 FE) MG  TABS Take 1 tablet by mouth daily.   Yes Historical Provider, MD  furosemide (LASIX) 20 MG tablet 1 TABLET (20 MG) BY MOUTH DAILY 03/22/15  Yes Historical Provider, MD  JANUVIA 50 MG tablet 1 TABLET (50 MG) BY MOUTH DAILY 03/11/15  Yes Historical Provider, MD  levalbuterol (XOPENEX) 1.25 MG/3ML nebulizer solution Take 1.25 mg by nebulization every 4 (four) hours as needed for wheezing.   Yes Historical Provider, MD  Magnesium 250 MG TABS Take 250 mg by mouth daily.    Yes Historical Provider, MD   mirabegron ER (MYRBETRIQ) 50 MG TB24 tablet Take 50 mg by mouth daily.   Yes Historical Provider, MD  potassium chloride (K-DUR,KLOR-CON) 10 MEQ tablet Take 10 mEq by mouth daily.  03/22/15  Yes Historical Provider, MD  pravastatin (PRAVACHOL) 40 MG tablet Take 40 mg by mouth daily.   Yes Historical Provider, MD  Protein (PROCEL) POWD Take 1 scoop by mouth 2 (two) times daily.    Yes Historical Provider, MD  ranitidine (ZANTAC) 150 MG tablet Take 1 tablet (150 mg total) by mouth 2 (two) times daily. 02/29/12 04/07/16 Yes Dixie Dials, MD  traMADol (ULTRAM) 50 MG tablet Take 1-2 tablets (50-100 mg total) by mouth every 6 (six) hours as needed for moderate pain or severe pain. 04/13/15  Yes Modena Jansky, MD  VENTOLIN HFA 108 (90 BASE) MCG/ACT inhaler INHALE 2 PUFFS Q 4 H PRN 02/22/15  Yes Historical Provider, MD  vitamin B-12 (CYANOCOBALAMIN) 500 MCG tablet Take 500 mcg by mouth daily.   Yes Historical Provider, MD    Family History Family History  Problem Relation Age of Onset  . Hypertension Mother   . Diabetes Mellitus I Mother   . Hypertension Father     Social History Social History  Substance Use Topics  . Smoking status: Never Smoker  . Smokeless tobacco: Never Used  . Alcohol use No     Allergies   Catapres [clonidine hcl]; Covera-hs [verapamil hcl]; Other; Sulfa antibiotics; Sulfamethoxazole; and Verapamil   Review of Systems Review of Systems  All other systems reviewed and are negative.    Physical Exam Updated Vital Signs BP 143/75   Pulse 93   Temp 98.2 F (36.8 C) (Oral)   Resp 14   Ht 5\' 4"  (1.626 m)   Wt 195 lb (88.5 kg)   SpO2 97%   BMI 33.47 kg/m   Physical Exam  Constitutional: She appears well-developed and well-nourished. No distress.  HENT:  Head: Normocephalic.  Minimal scalp hematoma in frontal parietal area  Eyes: Conjunctivae are normal.  Neck: Neck supple.  Slight posterior cervical tenderness  Cardiovascular: Normal rate and regular  rhythm.   No murmur heard. Pulmonary/Chest: Effort normal and breath sounds normal. No respiratory distress.  Abdominal: Soft. There is no tenderness.  Musculoskeletal: She exhibits no edema.  Neurological: She is alert.  Skin: Skin is warm and dry.  Psychiatric: She has a normal mood and affect.  Nursing note and vitals reviewed.    ED Treatments / Results  Labs (all labs ordered are listed, but only abnormal results are displayed) Labs Reviewed - No data to display  EKG  EKG Interpretation None       Radiology Ct Head Wo Contrast  Result Date: 03/25/2016 CLINICAL DATA:  Thickness. Imbalance. Fell. Head soreness and neck pain. EXAM: CT HEAD WITHOUT CONTRAST CT CERVICAL SPINE WITHOUT CONTRAST TECHNIQUE: Multidetector CT imaging of the head and cervical spine was performed following the standard protocol  without intravenous contrast. Multiplanar CT image reconstructions of the cervical spine were also generated. COMPARISON:  Head CT dated 02/26/2012 and brain MR dated 02/04/2009. FINDINGS: CT HEAD FINDINGS Diffusely enlarged ventricles and subarachnoid spaces. Patchy white matter low density in both cerebral hemispheres. Small anterior interhemispheric lipoma. No skull fracture, intracranial hemorrhage or paranasal sinus air-fluid levels. Right maxillary sinus retention cyst. CT CERVICAL SPINE FINDINGS Multilevel degenerative changes. These include facet degenerative changes with associated mild anterolisthesis at the C2-3 level, mild retrolisthesis at the C3-4, C4-5 and C5-6 levels and mild anterolisthesis at the C6-7 and C7-T1 levels. The right lobe of the thyroid gland is enlarged and contains a 6 mm densely calcified nodule. The isthmus and left lobe are small or surgically absent, difficult to assess due to streak artifacts produced by bilateral shoulder prostheses. IMPRESSION: 1. No skull fracture or intracranial hemorrhage. 2. No cervical spine fracture or traumatic subluxation. 3.  No significant change in diffuse cerebral and cerebellar atrophy and chronic small vessel white matter ischemic changes in both cerebral hemispheres. 4. Multilevel cervical spine degenerative changes. 5. Right thyroid goiter. Electronically Signed   By: Claudie Revering M.D.   On: 03/25/2016 22:48   Ct Cervical Spine Wo Contrast  Result Date: 03/25/2016 CLINICAL DATA:  Thickness. Imbalance. Fell. Head soreness and neck pain. EXAM: CT HEAD WITHOUT CONTRAST CT CERVICAL SPINE WITHOUT CONTRAST TECHNIQUE: Multidetector CT imaging of the head and cervical spine was performed following the standard protocol without intravenous contrast. Multiplanar CT image reconstructions of the cervical spine were also generated. COMPARISON:  Head CT dated 02/26/2012 and brain MR dated 02/04/2009. FINDINGS: CT HEAD FINDINGS Diffusely enlarged ventricles and subarachnoid spaces. Patchy white matter low density in both cerebral hemispheres. Small anterior interhemispheric lipoma. No skull fracture, intracranial hemorrhage or paranasal sinus air-fluid levels. Right maxillary sinus retention cyst. CT CERVICAL SPINE FINDINGS Multilevel degenerative changes. These include facet degenerative changes with associated mild anterolisthesis at the C2-3 level, mild retrolisthesis at the C3-4, C4-5 and C5-6 levels and mild anterolisthesis at the C6-7 and C7-T1 levels. The right lobe of the thyroid gland is enlarged and contains a 6 mm densely calcified nodule. The isthmus and left lobe are small or surgically absent, difficult to assess due to streak artifacts produced by bilateral shoulder prostheses. IMPRESSION: 1. No skull fracture or intracranial hemorrhage. 2. No cervical spine fracture or traumatic subluxation. 3. No significant change in diffuse cerebral and cerebellar atrophy and chronic small vessel white matter ischemic changes in both cerebral hemispheres. 4. Multilevel cervical spine degenerative changes. 5. Right thyroid goiter.  Electronically Signed   By: Claudie Revering M.D.   On: 03/25/2016 22:48    Procedures Procedures (including critical care time)  Medications Ordered in ED Medications - No data to display   Initial Impression / Assessment and Plan / ED Course  I have reviewed the triage vital signs and the nursing notes.  Pertinent labs & imaging results that were available during my care of the patient were reviewed by me and considered in my medical decision making (see chart for details).  Clinical Course    CT scan of head and cervical spine show no acute injury. Patient is alert  without neurological deficits.  Discussed findings with the patient and her family  Final Clinical Impressions(s) / ED Diagnoses   Final diagnoses:  Fall  Fall, initial encounter  Minor head injury, initial encounter  Neck pain    New Prescriptions New Prescriptions   No medications on file  Nat Christen, MD 03/25/16 2308

## 2016-03-25 NOTE — ED Triage Notes (Signed)
Patient here following fall while waking to car this evening. States that her legs gave way and she fell backwards striking head on concrete, denies loc, states that her head and posterior neck hurt with left shoulder pain. No blurred vision, alert and oriented. c-collar applied at triage

## 2016-03-29 ENCOUNTER — Ambulatory Visit: Payer: Medicare Other | Admitting: Podiatry

## 2016-03-30 ENCOUNTER — Other Ambulatory Visit: Payer: Self-pay | Admitting: Geriatric Medicine

## 2016-03-30 ENCOUNTER — Ambulatory Visit
Admission: RE | Admit: 2016-03-30 | Discharge: 2016-03-30 | Disposition: A | Discharge status: Home / Self Care | Payer: Medicare Other | Source: Ambulatory Visit | Attending: Geriatric Medicine | Admitting: Geriatric Medicine

## 2016-03-30 DIAGNOSIS — R0789 Other chest pain: Secondary | ICD-10-CM

## 2016-03-30 DIAGNOSIS — I10 Essential (primary) hypertension: Secondary | ICD-10-CM | POA: Diagnosis not present

## 2016-03-30 DIAGNOSIS — Z7984 Long term (current) use of oral hypoglycemic drugs: Secondary | ICD-10-CM | POA: Diagnosis not present

## 2016-03-30 DIAGNOSIS — R079 Chest pain, unspecified: Secondary | ICD-10-CM | POA: Diagnosis not present

## 2016-03-30 DIAGNOSIS — E113299 Type 2 diabetes mellitus with mild nonproliferative diabetic retinopathy without macular edema, unspecified eye: Secondary | ICD-10-CM | POA: Diagnosis not present

## 2016-03-30 DIAGNOSIS — G3184 Mild cognitive impairment, so stated: Secondary | ICD-10-CM | POA: Diagnosis not present

## 2016-04-20 DIAGNOSIS — H353211 Exudative age-related macular degeneration, right eye, with active choroidal neovascularization: Secondary | ICD-10-CM | POA: Diagnosis not present

## 2016-04-20 DIAGNOSIS — E113513 Type 2 diabetes mellitus with proliferative diabetic retinopathy with macular edema, bilateral: Secondary | ICD-10-CM | POA: Diagnosis not present

## 2016-04-20 DIAGNOSIS — H353121 Nonexudative age-related macular degeneration, left eye, early dry stage: Secondary | ICD-10-CM | POA: Diagnosis not present

## 2016-05-11 ENCOUNTER — Ambulatory Visit (INDEPENDENT_AMBULATORY_CARE_PROVIDER_SITE_OTHER): Payer: Medicare Other | Admitting: Podiatry

## 2016-05-11 ENCOUNTER — Encounter: Payer: Self-pay | Admitting: Podiatry

## 2016-05-11 VITALS — BP 148/91 | HR 85 | Resp 14

## 2016-05-11 DIAGNOSIS — Q828 Other specified congenital malformations of skin: Secondary | ICD-10-CM

## 2016-05-11 DIAGNOSIS — M79673 Pain in unspecified foot: Secondary | ICD-10-CM | POA: Diagnosis not present

## 2016-05-11 DIAGNOSIS — E1140 Type 2 diabetes mellitus with diabetic neuropathy, unspecified: Secondary | ICD-10-CM

## 2016-05-11 DIAGNOSIS — M2010 Hallux valgus (acquired), unspecified foot: Secondary | ICD-10-CM

## 2016-05-11 DIAGNOSIS — B351 Tinea unguium: Secondary | ICD-10-CM

## 2016-05-11 NOTE — Progress Notes (Signed)
Patient ID: Heidi Dominguez, female   DOB: [birth year], 82 y.o.   MRN: [id number] Complaint:  Visit Type: Patient returns to my office for continued preventative foot care services. Complaint: Patient states" my nails have grown long and thick and become painful to walk and wear shoes" Patient has been diagnosed with DM with no foot complications. The patient presents for preventative foot care services. No changes to ROS.  Her diabetic shoes are good. Painful callus under HAV left foot  Podiatric Exam: Vascular: dorsalis pedis and posterior tibial pulses are palpable bilateral. Capillary return is immediate. Temperature gradient is WNL. Skin turgor WNL  Sensorium: Normal Semmes Weinstein monofilament test. Normal tactile sensation bilaterally. Nail Exam: Pt has thick disfigured discolored nails with subungual debris noted bilateral entire nail hallux through fifth toenails Ulcer Exam: There is no evidence of ulcer or pre-ulcerative changes or infection. Orthopedic Exam: Muscle tone and strength are WNL. No limitations in general ROM. No crepitus or effusions noted. Foot type and digits show no abnormalities. Bony prominences are unremarkable. Severe HAV 1st MPJ B/l Skin: No Porokeratosis. No infection or ulcers.  Callus sub 1 due to severe HAV.  Diagnosis:  Onychomycosis, , Pain in right toe, pain in left toes, Callus left foot.  Treatment & Plan Procedures and Treatment: Consent by patient was obtained for treatment procedures. The patient understood the discussion of treatment and procedures well. All questions were answered thoroughly reviewed. Debridement of mycotic and hypertrophic toenails, 1 through 5 bilateral and clearing of subungual debris. No ulceration, no infection noted.  Add dispersion padding. Return Visit-Office Procedure: Patient instructed to return to the office for a follow up visit 3 months for continued evaluation and treatment.  Gardiner Barefoot DPM

## 2016-05-12 ENCOUNTER — Emergency Department (HOSPITAL_BASED_OUTPATIENT_CLINIC_OR_DEPARTMENT_OTHER)
Admission: EM | Admit: 2016-05-12 | Discharge: 2016-05-12 | Disposition: A | Discharge status: Home / Self Care | Payer: Medicare Other | Attending: Emergency Medicine | Admitting: Emergency Medicine

## 2016-05-12 ENCOUNTER — Encounter (HOSPITAL_BASED_OUTPATIENT_CLINIC_OR_DEPARTMENT_OTHER): Payer: Self-pay | Admitting: Emergency Medicine

## 2016-05-12 DIAGNOSIS — N390 Urinary tract infection, site not specified: Secondary | ICD-10-CM | POA: Insufficient documentation

## 2016-05-12 DIAGNOSIS — E119 Type 2 diabetes mellitus without complications: Secondary | ICD-10-CM | POA: Insufficient documentation

## 2016-05-12 DIAGNOSIS — Z7982 Long term (current) use of aspirin: Secondary | ICD-10-CM | POA: Insufficient documentation

## 2016-05-12 DIAGNOSIS — I5032 Chronic diastolic (congestive) heart failure: Secondary | ICD-10-CM | POA: Insufficient documentation

## 2016-05-12 DIAGNOSIS — R062 Wheezing: Secondary | ICD-10-CM | POA: Insufficient documentation

## 2016-05-12 DIAGNOSIS — R112 Nausea with vomiting, unspecified: Secondary | ICD-10-CM | POA: Diagnosis not present

## 2016-05-12 DIAGNOSIS — Z7984 Long term (current) use of oral hypoglycemic drugs: Secondary | ICD-10-CM | POA: Insufficient documentation

## 2016-05-12 DIAGNOSIS — I110 Hypertensive heart disease with heart failure: Secondary | ICD-10-CM | POA: Insufficient documentation

## 2016-05-12 LAB — CBC WITH DIFFERENTIAL/PLATELET
Basophils Absolute: 0 10*3/uL (ref 0.0–0.1)
Basophils Relative: 0 %
EOS PCT: 1 %
Eosinophils Absolute: 0.1 10*3/uL (ref 0.0–0.7)
HEMATOCRIT: 42.3 % (ref 36.0–46.0)
HEMOGLOBIN: 13.5 g/dL (ref 12.0–15.0)
LYMPHS ABS: 1.3 10*3/uL (ref 0.7–4.0)
LYMPHS PCT: 14 %
MCH: 29.8 pg (ref 26.0–34.0)
MCHC: 31.9 g/dL (ref 30.0–36.0)
MCV: 93.4 fL (ref 78.0–100.0)
Monocytes Absolute: 0.7 10*3/uL (ref 0.1–1.0)
Monocytes Relative: 8 %
NEUTROS ABS: 6.7 10*3/uL (ref 1.7–7.7)
NEUTROS PCT: 77 %
Platelets: 159 10*3/uL (ref 150–400)
RBC: 4.53 MIL/uL (ref 3.87–5.11)
RDW: 13.6 % (ref 11.5–15.5)
WBC: 8.7 10*3/uL (ref 4.0–10.5)

## 2016-05-12 LAB — COMPREHENSIVE METABOLIC PANEL
ALK PHOS: 74 U/L (ref 38–126)
ALT: 17 U/L (ref 14–54)
AST: 23 U/L (ref 15–41)
Albumin: 3.2 g/dL — ABNORMAL LOW (ref 3.5–5.0)
Anion gap: 6 (ref 5–15)
BILIRUBIN TOTAL: 0.6 mg/dL (ref 0.3–1.2)
BUN: 26 mg/dL — AB (ref 6–20)
CALCIUM: 9 mg/dL (ref 8.9–10.3)
CO2: 29 mmol/L (ref 22–32)
CREATININE: 0.79 mg/dL (ref 0.44–1.00)
Chloride: 103 mmol/L (ref 101–111)
Glucose, Bld: 215 mg/dL — ABNORMAL HIGH (ref 65–99)
Potassium: 3.9 mmol/L (ref 3.5–5.1)
Sodium: 138 mmol/L (ref 135–145)
Total Protein: 6.7 g/dL (ref 6.5–8.1)

## 2016-05-12 LAB — URINALYSIS, ROUTINE W REFLEX MICROSCOPIC
BILIRUBIN URINE: NEGATIVE
GLUCOSE, UA: NEGATIVE mg/dL
Ketones, ur: NEGATIVE mg/dL
Nitrite: POSITIVE — AB
PROTEIN: NEGATIVE mg/dL
Specific Gravity, Urine: 1.005 (ref 1.005–1.030)
pH: 6 (ref 5.0–8.0)

## 2016-05-12 LAB — URINE MICROSCOPIC-ADD ON

## 2016-05-12 LAB — LIPASE, BLOOD: LIPASE: 22 U/L (ref 11–51)

## 2016-05-12 MED ORDER — SODIUM CHLORIDE 0.9 % IV BOLUS (SEPSIS)
500.0000 mL | Freq: Once | INTRAVENOUS | Status: AC
  Administered 2016-05-12: 500 mL via INTRAVENOUS

## 2016-05-12 MED ORDER — NITROFURANTOIN MONOHYD MACRO 100 MG PO CAPS
100.0000 mg | ORAL_CAPSULE | Freq: Once | ORAL | Status: AC
  Administered 2016-05-12: 100 mg via ORAL
  Filled 2016-05-12: qty 1

## 2016-05-12 MED ORDER — ONDANSETRON 4 MG PO TBDP: 4.0000 mg | ORAL_TABLET | Freq: Three times a day (TID) | ORAL | 0 refills | Status: DC | PRN

## 2016-05-12 MED ORDER — ONDANSETRON HCL 4 MG/2ML IJ SOLN
4.0000 mg | Freq: Once | INTRAMUSCULAR | Status: AC
  Administered 2016-05-12: 4 mg via INTRAVENOUS
  Filled 2016-05-12: qty 2

## 2016-05-12 MED ORDER — SODIUM CHLORIDE 0.9 % IV SOLN: Freq: Once | INTRAVENOUS | Status: DC

## 2016-05-12 MED ORDER — ALBUTEROL SULFATE HFA 108 (90 BASE) MCG/ACT IN AERS
1.0000 | INHALATION_SPRAY | Freq: Four times a day (QID) | RESPIRATORY_TRACT | Status: DC
  Administered 2016-05-12: 2 via RESPIRATORY_TRACT
  Filled 2016-05-12: qty 6.7

## 2016-05-12 MED ORDER — NITROFURANTOIN MONOHYD MACRO 100 MG PO CAPS: 100.0000 mg | ORAL_CAPSULE | Freq: Two times a day (BID) | ORAL | 0 refills | Status: DC

## 2016-05-12 NOTE — ED Notes (Signed)
Pt verbalizes understanding of d/c instructions and denies any further needs at this time. 

## 2016-05-12 NOTE — ED Triage Notes (Signed)
Pt started with vomiting x 3-4 hours ago. Blood sugar is 224.

## 2016-05-12 NOTE — ED Provider Notes (Signed)
[facility] DEPT [facility] Provider Note   CSN: [id number] Arrival date & time: 05/12/16  0005     History   Chief Complaint Chief Complaint  Patient presents with  . Emesis    HPI Heidi Dominguez is a 82 y.o. female. She presents with a complaint of nausea and vomiting. Symptoms started approximately 6 on the ceiling. States that after she was vomiting she thought she was wheezing several little bit. Her blood sugar was 220 at home. She is on oral hypoglycemics only. No abdominal pain. No diarrhea. Heme-negative nonbilious emesis. Continued mild nausea now.  HPI  Past Medical History:  Diagnosis Date  . Chronic diastolic CHF (congestive heart failure) ([place])   . Complication of anesthesia    I have a hard time waking up"  . Degenerative joint disease of shoulder region   . Diabetes mellitus   . GERD (gastroesophageal reflux disease)   . High cholesterol   . HOH (hard of hearing)   . Hypertension   . Osteoarthritis   . Pneumonia     Patient Active Problem List   Diagnosis Date Noted  . Choledocholithiasis with chronic cholecystitis, nonobstructing 04/11/2015  . Pre-operative cardiovascular examination, high risk surgery   . Epigastric abdominal pain 04/08/2015  . Acute gallstone pancreatitis s/p lap chole w [facility] 04/11/2015 04/08/2015  . Diabetes mellitus type 2, controlled ([place]) 04/08/2015  . Gallstone pancreatitis 04/08/2015  . Mitral regurgitation 04/08/2015  . Chronic diastolic CHF (congestive heart failure) ([place]) 04/08/2015  . Dyspnea 03/31/2015  . Ischemic colitis ([place]) 08/09/2012  . Colitis 08/07/2012  . Rectal bleeding 08/07/2012  . Weakness 09/24/2011  . Bradycardia 09/24/2011  . UTI (lower urinary tract infection) 09/24/2011  . Hematochezia 09/24/2011  . DM (diabetes mellitus) ([place]) 09/24/2011  . Essential hypertension 09/24/2011  . Dyslipidemia 09/24/2011    Past Surgical History:  Procedure Laterality Date  . ABDOMINAL HYSTERECTOMY    . BACK SURGERY     . cataracts     bilateral  . KNEE SURGERY    . LAPAROSCOPIC CHOLECYSTECTOMY SINGLE SITE WITH INTRAOPERATIVE CHOLANGIOGRAM N/A 04/11/2015   Procedure: LAPAROSCOPIC LYSIS OF ADHESIONS, LAPAROSCOPIC CHOLECYSTECTOMY WITH INTRAOPERATIVE CHOLANGIOGRAM;  Surgeon: Michael Boston, MD;  Location: [facility];  Service: General;  Laterality: N/A;  . SHOULDER SURGERY      OB History    No data available       Home Medications    Prior to Admission medications   Medication Sig Start Date End Date Taking? Authorizing Provider  acetaminophen (TYLENOL) 325 MG tablet Take 2 tablets (650 mg total) by mouth every 6 (six) hours as needed (or Fever >/= 101). 08/12/12   Nita Sells, MD  albuterol (PROVENTIL HFA;VENTOLIN HFA) 108 (90 Base) MCG/ACT inhaler Inhale 2 puffs into the lungs every 4 (four) hours as needed for wheezing or shortness of breath.    Historical Provider, MD  amLODipine (NORVASC) 10 MG tablet Take 1 tablet (10 mg total) by mouth daily. 04/13/15   Modena Jansky, MD  aspirin 81 MG tablet Take 81 mg by mouth daily.    Historical Provider, MD  budesonide-formoterol (SYMBICORT) 160-4.5 MCG/ACT inhaler Inhale 2 puffs into the lungs 2 (two) times daily. 04/13/15   Modena Jansky, MD  Cholecalciferol (VITAMIN D3) 5000 UNITS CAPS Take 1 capsule by mouth daily.    Historical Provider, MD  Ferrous Gluconate (IRON) 240 (27 FE) MG TABS Take 1 tablet by mouth daily.    Historical Provider, MD  furosemide (LASIX) 20 MG  tablet 1 TABLET (20 MG) BY MOUTH DAILY 03/22/15   Historical Provider, MD  JANUVIA 50 MG tablet 1 TABLET (50 MG) BY MOUTH DAILY 03/11/15   Historical Provider, MD  levalbuterol (XOPENEX) 1.25 MG/3ML nebulizer solution Take 1.25 mg by nebulization every 4 (four) hours as needed for wheezing.    Historical Provider, MD  Magnesium 250 MG TABS Take 250 mg by mouth daily.     Historical Provider, MD  mirabegron ER (MYRBETRIQ) 50 MG TB24 tablet Take 50 mg by mouth daily.    Historical  Provider, MD  nitrofurantoin, macrocrystal-monohydrate, (MACROBID) 100 MG capsule Take 1 capsule (100 mg total) by mouth 2 (two) times daily. 05/12/16   Tanna Furry, MD  ondansetron (ZOFRAN ODT) 4 MG disintegrating tablet Take 1 tablet (4 mg total) by mouth every 8 (eight) hours as needed for nausea. 05/12/16   Tanna Furry, MD  potassium chloride (K-DUR,KLOR-CON) 10 MEQ tablet Take 10 mEq by mouth daily.  03/22/15   Historical Provider, MD  pravastatin (PRAVACHOL) 40 MG tablet Take 40 mg by mouth daily.    Historical Provider, MD  Protein (PROCEL) POWD Take 1 scoop by mouth 2 (two) times daily.     Historical Provider, MD  ranitidine (ZANTAC) 150 MG tablet Take 1 tablet (150 mg total) by mouth 2 (two) times daily. 02/29/12 04/07/16  Dixie Dials, MD  traMADol (ULTRAM) 50 MG tablet Take 1-2 tablets (50-100 mg total) by mouth every 6 (six) hours as needed for moderate pain or severe pain. 04/13/15   Modena Jansky, MD  VENTOLIN HFA 108 (90 BASE) MCG/ACT inhaler INHALE 2 PUFFS Q 4 H PRN 02/22/15   Historical Provider, MD  vitamin B-12 (CYANOCOBALAMIN) 500 MCG tablet Take 500 mcg by mouth daily.    Historical Provider, MD    Family History Family History  Problem Relation Age of Onset  . Hypertension Mother   . Diabetes Mellitus I Mother   . Hypertension Father     Social History Social History  Substance Use Topics  . Smoking status: Never Smoker  . Smokeless tobacco: Never Used  . Alcohol use No     Allergies   Catapres [clonidine hcl]; Covera-hs [verapamil hcl]; Other; Sulfa antibiotics; Sulfamethoxazole; and Verapamil   Review of Systems Review of Systems  Constitutional: Negative for appetite change, chills, diaphoresis, fatigue and fever.  HENT: Negative for mouth sores, sore throat and trouble swallowing.   Eyes: Negative for visual disturbance.  Respiratory: Positive for wheezing. Negative for cough, chest tightness and shortness of breath.   Cardiovascular: Negative for chest  pain.  Gastrointestinal: Positive for nausea and vomiting. Negative for abdominal distention, abdominal pain and diarrhea.  Endocrine: Negative for polydipsia, polyphagia and polyuria.  Genitourinary: Negative for dysuria, frequency and hematuria.  Musculoskeletal: Negative for gait problem.  Skin: Negative for color change, pallor and rash.  Neurological: Negative for dizziness, syncope, light-headedness and headaches.  Hematological: Does not bruise/bleed easily.  Psychiatric/Behavioral: Negative for behavioral problems and confusion.     Physical Exam Updated Vital Signs BP 135/66 (BP Location: Left Arm)   Pulse 99   Temp 98.1 F (36.7 C) (Oral)   Resp 21   Ht 5\' 4"  (1.626 m)   Wt 185 lb (83.9 kg)   SpO2 90%   BMI 31.76 kg/m   Physical Exam  Constitutional: She is oriented to person, place, and time. She appears well-developed and well-nourished. No distress.  HENT:  Head: Normocephalic.  Eyes: Conjunctivae are normal. Pupils are equal,  round, and reactive to light. No scleral icterus.  Neck: Normal range of motion. Neck supple. No thyromegaly present.  Cardiovascular: Normal rate and regular rhythm.  Exam reveals no gallop and no friction rub.   No murmur heard. Pulmonary/Chest: Effort normal and breath sounds normal. No respiratory distress. She has no wheezes. She has no rales.  Faint end expiratory wheeze posteriorly  Abdominal: Soft. Bowel sounds are normal. She exhibits no distension. There is no tenderness. There is no rebound.  Obese abdomen nontender. Normal active to slightly hypoactive bowel sounds.  Musculoskeletal: Normal range of motion.  Neurological: She is alert and oriented to person, place, and time.  Skin: Skin is warm and dry. No rash noted.  Psychiatric: She has a normal mood and affect. Her behavior is normal.     ED Treatments / Results  Labs (all labs ordered are listed, but only abnormal results are displayed) Labs Reviewed  COMPREHENSIVE  METABOLIC PANEL - Abnormal; Notable for the following:       Result Value   Glucose, Bld 215 (*)    BUN 26 (*)    Albumin 3.2 (*)    All other components within normal limits  URINALYSIS, ROUTINE W REFLEX MICROSCOPIC (NOT AT [facility]) - Abnormal; Notable for the following:    APPearance CLOUDY (*)    Hgb urine dipstick TRACE (*)    Nitrite POSITIVE (*)    Leukocytes, UA LARGE (*)    All other components within normal limits  URINE MICROSCOPIC-ADD ON - Abnormal; Notable for the following:    Squamous Epithelial / LPF 0-5 (*)    Bacteria, UA MANY (*)    All other components within normal limits  CBC WITH DIFFERENTIAL/PLATELET  LIPASE, BLOOD    EKG  EKG Interpretation None       Radiology No results found.  Procedures Procedures (including critical care time)  Medications Ordered in ED Medications  0.9 %  sodium chloride infusion (not administered)  albuterol (PROVENTIL HFA;VENTOLIN HFA) 108 (90 Base) MCG/ACT inhaler 1-2 puff (2 puffs Inhalation Given 05/12/16 0036)  sodium chloride 0.9 % bolus 500 mL (0 mLs Intravenous Stopped 05/12/16 0204)  ondansetron (ZOFRAN) injection 4 mg (4 mg Intravenous Given 05/12/16 0115)  nitrofurantoin (macrocrystal-monohydrate) (MACROBID) capsule 100 mg (100 mg Oral Given 05/12/16 0239)     Initial Impression / Assessment and Plan / ED Course  I have reviewed the triage vital signs and the nursing notes.  Pertinent labs & imaging results that were available during my care of the patient were reviewed by me and considered in my medical decision making (see chart for details).  Clinical Course    Given 500 mL fluid. Given Zofran. Given breathing treatment. Await labs  Final Clinical Impressions(s) / ED Diagnoses   Final diagnoses:  Non-intractable vomiting with nausea, vomiting of unspecified type  UTI (lower urinary tract infection)   Urine shows nitrates, bacteria, and cells. Does show some squamous cells. She has some frequency. Will  treat with 3 days Macrobid. Zofran as needed. She is tolerating by mouth. Appropriate for discharge.   New Prescriptions New Prescriptions   NITROFURANTOIN, MACROCRYSTAL-MONOHYDRATE, (MACROBID) 100 MG CAPSULE    Take 1 capsule (100 mg total) by mouth 2 (two) times daily.   ONDANSETRON (ZOFRAN ODT) 4 MG DISINTEGRATING TABLET    Take 1 tablet (4 mg total) by mouth every 8 (eight) hours as needed for nausea.     Tanna Furry, MD 05/12/16 [id number]

## 2016-05-12 NOTE — ED Notes (Signed)
Pt tolerated ginger ale with out vomiting

## 2016-05-24 DIAGNOSIS — Z23 Encounter for immunization: Secondary | ICD-10-CM | POA: Diagnosis not present

## 2016-05-24 DIAGNOSIS — E113299 Type 2 diabetes mellitus with mild nonproliferative diabetic retinopathy without macular edema, unspecified eye: Secondary | ICD-10-CM | POA: Diagnosis not present

## 2016-05-24 DIAGNOSIS — Z1389 Encounter for screening for other disorder: Secondary | ICD-10-CM | POA: Diagnosis not present

## 2016-05-24 DIAGNOSIS — J309 Allergic rhinitis, unspecified: Secondary | ICD-10-CM | POA: Diagnosis not present

## 2016-05-24 DIAGNOSIS — E119 Type 2 diabetes mellitus without complications: Secondary | ICD-10-CM | POA: Diagnosis not present

## 2016-05-24 DIAGNOSIS — I10 Essential (primary) hypertension: Secondary | ICD-10-CM | POA: Diagnosis not present

## 2016-05-24 DIAGNOSIS — Z7984 Long term (current) use of oral hypoglycemic drugs: Secondary | ICD-10-CM | POA: Diagnosis not present

## 2016-06-01 DIAGNOSIS — N3281 Overactive bladder: Secondary | ICD-10-CM | POA: Diagnosis not present

## 2016-06-01 DIAGNOSIS — N3000 Acute cystitis without hematuria: Secondary | ICD-10-CM | POA: Diagnosis not present

## 2016-06-01 DIAGNOSIS — R351 Nocturia: Secondary | ICD-10-CM | POA: Diagnosis not present

## 2016-06-15 DIAGNOSIS — R531 Weakness: Secondary | ICD-10-CM | POA: Diagnosis not present

## 2016-06-15 DIAGNOSIS — T148XXA Other injury of unspecified body region, initial encounter: Secondary | ICD-10-CM | POA: Diagnosis not present

## 2016-06-15 DIAGNOSIS — R195 Other fecal abnormalities: Secondary | ICD-10-CM | POA: Diagnosis not present

## 2016-06-15 DIAGNOSIS — W19XXXA Unspecified fall, initial encounter: Secondary | ICD-10-CM | POA: Diagnosis not present

## 2016-06-15 DIAGNOSIS — K521 Toxic gastroenteritis and colitis: Secondary | ICD-10-CM | POA: Diagnosis not present

## 2016-06-16 DIAGNOSIS — K521 Toxic gastroenteritis and colitis: Secondary | ICD-10-CM | POA: Diagnosis not present

## 2016-06-22 DIAGNOSIS — H353211 Exudative age-related macular degeneration, right eye, with active choroidal neovascularization: Secondary | ICD-10-CM | POA: Diagnosis not present

## 2016-06-23 DIAGNOSIS — Z1231 Encounter for screening mammogram for malignant neoplasm of breast: Secondary | ICD-10-CM | POA: Diagnosis not present

## 2016-06-30 DIAGNOSIS — R921 Mammographic calcification found on diagnostic imaging of breast: Secondary | ICD-10-CM | POA: Diagnosis not present

## 2016-07-03 DIAGNOSIS — K219 Gastro-esophageal reflux disease without esophagitis: Secondary | ICD-10-CM | POA: Diagnosis not present

## 2016-07-03 DIAGNOSIS — E113299 Type 2 diabetes mellitus with mild nonproliferative diabetic retinopathy without macular edema, unspecified eye: Secondary | ICD-10-CM | POA: Diagnosis not present

## 2016-07-03 DIAGNOSIS — R197 Diarrhea, unspecified: Secondary | ICD-10-CM | POA: Diagnosis not present

## 2016-07-03 DIAGNOSIS — I10 Essential (primary) hypertension: Secondary | ICD-10-CM | POA: Diagnosis not present

## 2016-07-03 DIAGNOSIS — Z794 Long term (current) use of insulin: Secondary | ICD-10-CM | POA: Diagnosis not present

## 2016-07-13 DIAGNOSIS — K922 Gastrointestinal hemorrhage, unspecified: Secondary | ICD-10-CM | POA: Diagnosis not present

## 2016-07-13 DIAGNOSIS — K529 Noninfective gastroenteritis and colitis, unspecified: Secondary | ICD-10-CM | POA: Diagnosis not present

## 2016-07-13 DIAGNOSIS — K921 Melena: Secondary | ICD-10-CM | POA: Diagnosis not present

## 2016-07-13 DIAGNOSIS — Z6833 Body mass index (BMI) 33.0-33.9, adult: Secondary | ICD-10-CM | POA: Diagnosis not present

## 2016-07-14 DIAGNOSIS — K529 Noninfective gastroenteritis and colitis, unspecified: Secondary | ICD-10-CM | POA: Diagnosis not present

## 2016-08-01 ENCOUNTER — Ambulatory Visit (INDEPENDENT_AMBULATORY_CARE_PROVIDER_SITE_OTHER): Payer: Medicare Other | Admitting: Sports Medicine

## 2016-08-01 ENCOUNTER — Encounter: Payer: Self-pay | Admitting: Sports Medicine

## 2016-08-01 DIAGNOSIS — E1151 Type 2 diabetes mellitus with diabetic peripheral angiopathy without gangrene: Secondary | ICD-10-CM

## 2016-08-01 DIAGNOSIS — M2010 Hallux valgus (acquired), unspecified foot: Secondary | ICD-10-CM

## 2016-08-01 DIAGNOSIS — M2040 Other hammer toe(s) (acquired), unspecified foot: Secondary | ICD-10-CM

## 2016-08-01 DIAGNOSIS — B351 Tinea unguium: Secondary | ICD-10-CM | POA: Diagnosis not present

## 2016-08-01 DIAGNOSIS — E1140 Type 2 diabetes mellitus with diabetic neuropathy, unspecified: Secondary | ICD-10-CM | POA: Diagnosis not present

## 2016-08-01 DIAGNOSIS — Q828 Other specified congenital malformations of skin: Secondary | ICD-10-CM

## 2016-08-01 NOTE — Progress Notes (Signed)
Subjective: Heidi Dominguez is a 82 y.o. female patient with history of diabetes who presents to office today complaining of long, painful nails  while ambulating in shoes; unable to trim. Patient states that the glucose reading this morning was 159m/dl. Patient denies any new changes in medication or new problems. Patient denies any new cramping, numbness, burning or tingling in the legs. Admits to unchanged soreness to legs and to bunion and hammertoes especially on the left foot.  Patient is assisted by daughter who helps with her care.   Patient Active Problem List   Diagnosis Date Noted  . Choledocholithiasis with chronic cholecystitis, nonobstructing 04/11/2015  . Pre-operative cardiovascular examination, high risk surgery   . Epigastric abdominal pain 04/08/2015  . [facility]  . Diabetes mellitus type 2, controlled ([place] 04/08/2015  . Gallstone pancreatitis 04/08/2015  . Mitral regurgitation 04/08/2015  . Chronic diastolic CHF (congestive heart failure) ([place] 04/08/2015  . Dyspnea 03/31/2015  . [place]schemic colitis ([place] 08/09/2012  . Colitis 08/07/2012  . Rectal bleeding 08/07/2012  . Weakness 09/24/2011  . Bradycardia 09/24/2011  . UTI (lower urinary tract infection) 09/24/2011  . Hematochezia 09/24/2011  . [place] (diabetes mellitus) ([place] 09/24/2011  . Essential hypertension 09/24/2011  . Dyslipidemia 09/24/2011   Current Outpatient Prescriptions on File Prior to Visit  Medication Sig Dispense Refill  . acetaminophen (TYLENOL) 325 MG tablet Take 2 tablets (650 mg total) by mouth every 6 (six) hours as needed (or Fever >/= 101).    .Marland Kitchenalbuterol (PROVENTIL HFA;VENTOLIN HFA) 108 (90 Base) MCG/ACT inhaler Inhale 2 puffs into the lungs every 4 (four) hours as needed for wheezing or shortness of breath.    .Marland KitchenamLODipine (NORVASC) 10 MG tablet Take 1 tablet (10 mg total) by mouth daily.    .Marland Kitchenaspirin 81 MG tablet Take 81 mg by mouth  daily.    . budesonide-formoterol (SYMBICORT) 160-4.5 MCG/ACT inhaler Inhale 2 puffs into the lungs 2 (two) times daily.    . Cholecalciferol (VITAMIN D3) 5000 UNITS CAPS Take 1 capsule by mouth daily.    . Ferrous Gluconate (IRON) 240 (27 FE) MG TABS Take 1 tablet by mouth daily.    . furosemide (LASIX) 20 MG tablet 1 TABLET (20 MG) BY MOUTH DAILY  6  . JANUVIA 50 MG tablet 1 TABLET (50 MG) BY MOUTH DAILY  5  . levalbuterol (XOPENEX) 1.25 MG/3ML nebulizer solution Take 1.25 mg by nebulization every 4 (four) hours as needed for wheezing.    . Magnesium 250 MG TABS Take 250 mg by mouth daily.     . mirabegron ER (MYRBETRIQ) 50 MG TB24 tablet Take 50 mg by mouth daily.    . nitrofurantoin, macrocrystal-monohydrate, (MACROBID) 100 MG capsule Take 1 capsule (100 mg total) by mouth 2 (two) times daily. 10 capsule 0  . ondansetron (ZOFRAN ODT) 4 MG disintegrating tablet Take 1 tablet (4 mg total) by mouth every 8 (eight) hours as needed for nausea. 6 tablet 0  . potassium chloride (K-DUR,KLOR-CON) 10 MEQ tablet Take 10 mEq by mouth daily.     . pravastatin (PRAVACHOL) 40 MG tablet Take 40 mg by mouth daily.    . Protein (PROCEL) POWD Take 1 scoop by mouth 2 (two) times daily.     . ranitidine (ZANTAC) 150 MG tablet Take 1 tablet (150 mg total) by mouth 2 (two) times daily. 60 tablet 1  . traMADol (ULTRAM) 50 MG tablet Take 1-2 tablets (50-100 mg  total) by mouth every 6 (six) hours as needed for moderate pain or severe pain. 20 tablet 0  . VENTOLIN HFA 108 (90 BASE) MCG/ACT inhaler INHALE 2 PUFFS Q 4 H PRN  4  . vitamin B-12 (CYANOCOBALAMIN) 500 MCG tablet Take 500 mcg by mouth daily.     No current facility-administered medications on file prior to visit.    Allergies  Allergen Reactions  . Catapres [Clonidine Hcl] Other (See Comments)    Unknown. Unknown.  Lorrin Goodell [Verapamil Hcl] Other (See Comments)    Unknown.  . Other Other (See Comments)    Any type of narcotics Feels "loopy" Any  type of narcotics Feels "loopy"  . Sulfa Antibiotics Itching  . Sulfamethoxazole Diarrhea  . Verapamil Diarrhea and Other (See Comments)  . Sulfasalazine Itching    Recent Results (from the past 2160 hour(s))  CBC with Differential/Platelet     Status: None   Collection Time: 05/12/16  1:15 AM  Result Value Ref Range   WBC 8.7 4.0 - 10.5 K/uL   RBC 4.53 3.87 - 5.11 MIL/uL   Hemoglobin 13.5 12.0 - 15.0 g/dL   HCT 42.3 36.0 - 46.0 %   MCV 93.4 78.0 - 100.0 fL   MCH 29.8 26.0 - 34.0 pg   MCHC 31.9 30.0 - 36.0 g/dL   RDW 13.6 11.5 - 15.5 %   Platelets 159 150 - 400 K/uL   Neutrophils Relative % 77 %   Neutro Abs 6.7 1.7 - 7.7 K/uL   Lymphocytes Relative 14 %   Lymphs Abs 1.3 0.7 - 4.0 K/uL   Monocytes Relative 8 %   Monocytes Absolute 0.7 0.1 - 1.0 K/uL   Eosinophils Relative 1 %   Eosinophils Absolute 0.1 0.0 - 0.7 K/uL   Basophils Relative 0 %   Basophils Absolute 0.0 0.0 - 0.1 K/uL  Comprehensive metabolic panel     Status: Abnormal   Collection Time: 05/12/16  1:15 AM  Result Value Ref Range   Sodium 138 135 - 145 mmol/L   Potassium 3.9 3.5 - 5.1 mmol/L   Chloride 103 101 - 111 mmol/L   CO2 29 22 - 32 mmol/L   Glucose, Bld 215 (H) 65 - 99 mg/dL   BUN 26 (H) 6 - 20 mg/dL   Creatinine, Ser 0.79 0.44 - 1.00 mg/dL   Calcium 9.0 8.9 - 10.3 mg/dL   Total Protein 6.7 6.5 - 8.1 g/dL   Albumin 3.2 (L) 3.5 - 5.0 g/dL   AST 23 15 - 41 U/L   ALT 17 14 - 54 U/L   Alkaline Phosphatase 74 38 - 126 U/L   Total Bilirubin 0.6 0.3 - 1.2 mg/dL   GFR calc non Af Amer >60 >60 mL/min   GFR calc Af Amer >60 >60 mL/min    Comment: (NOTE) The eGFR has been calculated using the CKD EPI equation. This calculation has not been validated in all clinical situations. eGFR's persistently <60 mL/min signify possible Chronic Kidney Disease.    Anion gap 6 5 - 15  Lipase, blood     Status: None   Collection Time: 05/12/16  1:15 AM  Result Value Ref Range   Lipase 22 11 - 51 U/L  Urinalysis,  Routine w reflex microscopic (not at [facility])     Status: Abnormal   Collection Time: 05/12/16  1:55 AM  Result Value Ref Range   Color, Urine YELLOW YELLOW   APPearance CLOUDY (A) CLEAR   Specific Gravity, Urine  1.005 1.005 - 1.030   pH 6.0 5.0 - 8.0   Glucose, UA NEGATIVE NEGATIVE mg/dL   Hgb urine dipstick TRACE (A) NEGATIVE   Bilirubin Urine NEGATIVE NEGATIVE   Ketones, ur NEGATIVE NEGATIVE mg/dL   Protein, ur NEGATIVE NEGATIVE mg/dL   Nitrite POSITIVE (A) NEGATIVE   Leukocytes, UA LARGE (A) NEGATIVE  Urine microscopic-add on     Status: Abnormal   Collection Time: 05/12/16  1:55 AM  Result Value Ref Range   Squamous Epithelial / LPF 0-5 (A) NONE SEEN   WBC, UA TOO NUMEROUS TO COUNT 0 - 5 WBC/hpf   RBC / HPF 0-5 0 - 5 RBC/hpf   Bacteria, UA MANY (A) NONE SEEN    Objective: General: Patient is awake, alert, and oriented x 3 and in no acute distress.  Integument: Skin is warm, dry and supple bilateral. Nails are tender, long, thickened and dystrophic with subungual debris, consistent with onychomycosis, 1-5 bilateral. No signs of infection. No open lesions or preulcerative lesions present bilateral. Mild reactive keratosis over left bunion. Remaining integument unremarkable.  Vasculature:  Dorsalis Pedis pulse 1/4 bilateral. Posterior Tibial pulse  0/4 bilateral. Capillary fill time <3 sec 1-5 bilateral. NO hair growth to the level of the digits.Temperature gradient within normal limits. + varicosities present bilateral. Trace edema present bilateral.   Neurology: The patient has intact sensation measured with a 5.07/10g Semmes Weinstein Monofilament at all pedal sites bilateral . Vibratory sensation diminished bilateral with tuning fork. No Babinski sign present bilateral.   Musculoskeletal: + Bunion, hammertoes, pes planus symptomatic pedal deformities noted bilateral. Muscular strength 4/5 in all lower extremity muscular groups bilateral without pain on range of motion. No  tenderness with calf compression bilateral.  Assessment and Plan: Problem List Items Addressed This Visit    None    Visit Diagnoses    Dermatophytosis of nail    -  Primary   Porokeratosis       Type 2 diabetes, controlled, with neuropathy (HCC)       Hallux valgus, acquired, unspecified laterality       Diabetic peripheral vascular disorder ([place])       Hammer toe, unspecified laterality          -Examined patient. -Discussed and educated patient on diabetic foot care, especially with  regards to the vascular, neurological and musculoskeletal systems.  -Stressed the importance of good glycemic control and the detriment of not  controlling glucose levels in relation to the foot. -Mechanically debrided all nails 1-5 bilateral using sterile nail nipper and filed with dremel without incident  -Dispensed bunion and hammertoe padding for left -Recommend good supportive shoes -Recommend daily skin emollients -[employer] diabetic shoe order form was completed; office to contact primary care for approval / certification;  Office to arrange shoe fitting and dispensing. -Answered all patient questions -Patient to return  in 3 months for at risk foot care -Patient advised to call the office if any problems or questions arise in the meantime.  Landis Martins, DPM

## 2016-08-03 DIAGNOSIS — H353211 Exudative age-related macular degeneration, right eye, with active choroidal neovascularization: Secondary | ICD-10-CM | POA: Diagnosis not present

## 2016-08-10 ENCOUNTER — Ambulatory Visit: Payer: Medicare Other | Admitting: Podiatry

## 2016-08-12 DIAGNOSIS — I340 Nonrheumatic mitral (valve) insufficiency: Secondary | ICD-10-CM | POA: Diagnosis not present

## 2016-08-12 DIAGNOSIS — Z7984 Long term (current) use of oral hypoglycemic drugs: Secondary | ICD-10-CM | POA: Diagnosis not present

## 2016-08-12 DIAGNOSIS — G3184 Mild cognitive impairment, so stated: Secondary | ICD-10-CM | POA: Diagnosis not present

## 2016-08-12 DIAGNOSIS — I10 Essential (primary) hypertension: Secondary | ICD-10-CM | POA: Diagnosis not present

## 2016-08-12 DIAGNOSIS — E113299 Type 2 diabetes mellitus with mild nonproliferative diabetic retinopathy without macular edema, unspecified eye: Secondary | ICD-10-CM | POA: Diagnosis not present

## 2016-08-12 DIAGNOSIS — R296 Repeated falls: Secondary | ICD-10-CM | POA: Diagnosis not present

## 2016-08-12 DIAGNOSIS — R269 Unspecified abnormalities of gait and mobility: Secondary | ICD-10-CM | POA: Diagnosis not present

## 2016-08-16 DIAGNOSIS — I340 Nonrheumatic mitral (valve) insufficiency: Secondary | ICD-10-CM | POA: Diagnosis not present

## 2016-08-16 DIAGNOSIS — R296 Repeated falls: Secondary | ICD-10-CM | POA: Diagnosis not present

## 2016-08-16 DIAGNOSIS — I10 Essential (primary) hypertension: Secondary | ICD-10-CM | POA: Diagnosis not present

## 2016-08-16 DIAGNOSIS — R269 Unspecified abnormalities of gait and mobility: Secondary | ICD-10-CM | POA: Diagnosis not present

## 2016-08-16 DIAGNOSIS — E113299 Type 2 diabetes mellitus with mild nonproliferative diabetic retinopathy without macular edema, unspecified eye: Secondary | ICD-10-CM | POA: Diagnosis not present

## 2016-08-16 DIAGNOSIS — G3184 Mild cognitive impairment, so stated: Secondary | ICD-10-CM | POA: Diagnosis not present

## 2016-08-18 DIAGNOSIS — G3184 Mild cognitive impairment, so stated: Secondary | ICD-10-CM | POA: Diagnosis not present

## 2016-08-18 DIAGNOSIS — I10 Essential (primary) hypertension: Secondary | ICD-10-CM | POA: Diagnosis not present

## 2016-08-18 DIAGNOSIS — R296 Repeated falls: Secondary | ICD-10-CM | POA: Diagnosis not present

## 2016-08-18 DIAGNOSIS — E113299 Type 2 diabetes mellitus with mild nonproliferative diabetic retinopathy without macular edema, unspecified eye: Secondary | ICD-10-CM | POA: Diagnosis not present

## 2016-08-18 DIAGNOSIS — I340 Nonrheumatic mitral (valve) insufficiency: Secondary | ICD-10-CM | POA: Diagnosis not present

## 2016-08-18 DIAGNOSIS — R269 Unspecified abnormalities of gait and mobility: Secondary | ICD-10-CM | POA: Diagnosis not present

## 2016-08-21 DIAGNOSIS — G3184 Mild cognitive impairment, so stated: Secondary | ICD-10-CM | POA: Diagnosis not present

## 2016-08-21 DIAGNOSIS — R269 Unspecified abnormalities of gait and mobility: Secondary | ICD-10-CM | POA: Diagnosis not present

## 2016-08-21 DIAGNOSIS — R296 Repeated falls: Secondary | ICD-10-CM | POA: Diagnosis not present

## 2016-08-21 DIAGNOSIS — I340 Nonrheumatic mitral (valve) insufficiency: Secondary | ICD-10-CM | POA: Diagnosis not present

## 2016-08-21 DIAGNOSIS — I10 Essential (primary) hypertension: Secondary | ICD-10-CM | POA: Diagnosis not present

## 2016-08-21 DIAGNOSIS — E113299 Type 2 diabetes mellitus with mild nonproliferative diabetic retinopathy without macular edema, unspecified eye: Secondary | ICD-10-CM | POA: Diagnosis not present

## 2016-08-23 DIAGNOSIS — G3184 Mild cognitive impairment, so stated: Secondary | ICD-10-CM | POA: Diagnosis not present

## 2016-08-23 DIAGNOSIS — I340 Nonrheumatic mitral (valve) insufficiency: Secondary | ICD-10-CM | POA: Diagnosis not present

## 2016-08-23 DIAGNOSIS — E113299 Type 2 diabetes mellitus with mild nonproliferative diabetic retinopathy without macular edema, unspecified eye: Secondary | ICD-10-CM | POA: Diagnosis not present

## 2016-08-23 DIAGNOSIS — I10 Essential (primary) hypertension: Secondary | ICD-10-CM | POA: Diagnosis not present

## 2016-08-23 DIAGNOSIS — R296 Repeated falls: Secondary | ICD-10-CM | POA: Diagnosis not present

## 2016-08-23 DIAGNOSIS — R269 Unspecified abnormalities of gait and mobility: Secondary | ICD-10-CM | POA: Diagnosis not present

## 2016-08-28 DIAGNOSIS — I340 Nonrheumatic mitral (valve) insufficiency: Secondary | ICD-10-CM | POA: Diagnosis not present

## 2016-08-28 DIAGNOSIS — E113299 Type 2 diabetes mellitus with mild nonproliferative diabetic retinopathy without macular edema, unspecified eye: Secondary | ICD-10-CM | POA: Diagnosis not present

## 2016-08-28 DIAGNOSIS — G3184 Mild cognitive impairment, so stated: Secondary | ICD-10-CM | POA: Diagnosis not present

## 2016-08-28 DIAGNOSIS — I10 Essential (primary) hypertension: Secondary | ICD-10-CM | POA: Diagnosis not present

## 2016-08-28 DIAGNOSIS — R269 Unspecified abnormalities of gait and mobility: Secondary | ICD-10-CM | POA: Diagnosis not present

## 2016-08-28 DIAGNOSIS — R296 Repeated falls: Secondary | ICD-10-CM | POA: Diagnosis not present

## 2016-09-01 DIAGNOSIS — E113299 Type 2 diabetes mellitus with mild nonproliferative diabetic retinopathy without macular edema, unspecified eye: Secondary | ICD-10-CM | POA: Diagnosis not present

## 2016-09-01 DIAGNOSIS — I10 Essential (primary) hypertension: Secondary | ICD-10-CM | POA: Diagnosis not present

## 2016-09-01 DIAGNOSIS — R296 Repeated falls: Secondary | ICD-10-CM | POA: Diagnosis not present

## 2016-09-01 DIAGNOSIS — R269 Unspecified abnormalities of gait and mobility: Secondary | ICD-10-CM | POA: Diagnosis not present

## 2016-09-01 DIAGNOSIS — G3184 Mild cognitive impairment, so stated: Secondary | ICD-10-CM | POA: Diagnosis not present

## 2016-09-01 DIAGNOSIS — I340 Nonrheumatic mitral (valve) insufficiency: Secondary | ICD-10-CM | POA: Diagnosis not present

## 2016-09-04 DIAGNOSIS — R269 Unspecified abnormalities of gait and mobility: Secondary | ICD-10-CM | POA: Diagnosis not present

## 2016-09-04 DIAGNOSIS — I10 Essential (primary) hypertension: Secondary | ICD-10-CM | POA: Diagnosis not present

## 2016-09-04 DIAGNOSIS — I340 Nonrheumatic mitral (valve) insufficiency: Secondary | ICD-10-CM | POA: Diagnosis not present

## 2016-09-04 DIAGNOSIS — G3184 Mild cognitive impairment, so stated: Secondary | ICD-10-CM | POA: Diagnosis not present

## 2016-09-04 DIAGNOSIS — E113299 Type 2 diabetes mellitus with mild nonproliferative diabetic retinopathy without macular edema, unspecified eye: Secondary | ICD-10-CM | POA: Diagnosis not present

## 2016-09-04 DIAGNOSIS — R296 Repeated falls: Secondary | ICD-10-CM | POA: Diagnosis not present

## 2016-09-06 DIAGNOSIS — I10 Essential (primary) hypertension: Secondary | ICD-10-CM | POA: Diagnosis not present

## 2016-09-06 DIAGNOSIS — R269 Unspecified abnormalities of gait and mobility: Secondary | ICD-10-CM | POA: Diagnosis not present

## 2016-09-06 DIAGNOSIS — E113299 Type 2 diabetes mellitus with mild nonproliferative diabetic retinopathy without macular edema, unspecified eye: Secondary | ICD-10-CM | POA: Diagnosis not present

## 2016-09-06 DIAGNOSIS — Z7984 Long term (current) use of oral hypoglycemic drugs: Secondary | ICD-10-CM | POA: Diagnosis not present

## 2016-09-08 DIAGNOSIS — R296 Repeated falls: Secondary | ICD-10-CM | POA: Diagnosis not present

## 2016-09-08 DIAGNOSIS — I340 Nonrheumatic mitral (valve) insufficiency: Secondary | ICD-10-CM | POA: Diagnosis not present

## 2016-09-08 DIAGNOSIS — E113299 Type 2 diabetes mellitus with mild nonproliferative diabetic retinopathy without macular edema, unspecified eye: Secondary | ICD-10-CM | POA: Diagnosis not present

## 2016-09-08 DIAGNOSIS — R269 Unspecified abnormalities of gait and mobility: Secondary | ICD-10-CM | POA: Diagnosis not present

## 2016-09-08 DIAGNOSIS — G3184 Mild cognitive impairment, so stated: Secondary | ICD-10-CM | POA: Diagnosis not present

## 2016-09-08 DIAGNOSIS — I10 Essential (primary) hypertension: Secondary | ICD-10-CM | POA: Diagnosis not present

## 2016-09-12 DIAGNOSIS — E113299 Type 2 diabetes mellitus with mild nonproliferative diabetic retinopathy without macular edema, unspecified eye: Secondary | ICD-10-CM | POA: Diagnosis not present

## 2016-09-12 DIAGNOSIS — I340 Nonrheumatic mitral (valve) insufficiency: Secondary | ICD-10-CM | POA: Diagnosis not present

## 2016-09-12 DIAGNOSIS — G3184 Mild cognitive impairment, so stated: Secondary | ICD-10-CM | POA: Diagnosis not present

## 2016-09-12 DIAGNOSIS — I10 Essential (primary) hypertension: Secondary | ICD-10-CM | POA: Diagnosis not present

## 2016-09-12 DIAGNOSIS — R269 Unspecified abnormalities of gait and mobility: Secondary | ICD-10-CM | POA: Diagnosis not present

## 2016-09-12 DIAGNOSIS — R296 Repeated falls: Secondary | ICD-10-CM | POA: Diagnosis not present

## 2016-09-14 DIAGNOSIS — H353211 Exudative age-related macular degeneration, right eye, with active choroidal neovascularization: Secondary | ICD-10-CM | POA: Diagnosis not present

## 2016-09-15 DIAGNOSIS — R296 Repeated falls: Secondary | ICD-10-CM | POA: Diagnosis not present

## 2016-09-15 DIAGNOSIS — E113299 Type 2 diabetes mellitus with mild nonproliferative diabetic retinopathy without macular edema, unspecified eye: Secondary | ICD-10-CM | POA: Diagnosis not present

## 2016-09-15 DIAGNOSIS — I10 Essential (primary) hypertension: Secondary | ICD-10-CM | POA: Diagnosis not present

## 2016-09-15 DIAGNOSIS — G3184 Mild cognitive impairment, so stated: Secondary | ICD-10-CM | POA: Diagnosis not present

## 2016-09-15 DIAGNOSIS — R269 Unspecified abnormalities of gait and mobility: Secondary | ICD-10-CM | POA: Diagnosis not present

## 2016-09-15 DIAGNOSIS — I340 Nonrheumatic mitral (valve) insufficiency: Secondary | ICD-10-CM | POA: Diagnosis not present

## 2016-09-19 DIAGNOSIS — E113299 Type 2 diabetes mellitus with mild nonproliferative diabetic retinopathy without macular edema, unspecified eye: Secondary | ICD-10-CM | POA: Diagnosis not present

## 2016-09-19 DIAGNOSIS — R296 Repeated falls: Secondary | ICD-10-CM | POA: Diagnosis not present

## 2016-09-19 DIAGNOSIS — I10 Essential (primary) hypertension: Secondary | ICD-10-CM | POA: Diagnosis not present

## 2016-09-19 DIAGNOSIS — G3184 Mild cognitive impairment, so stated: Secondary | ICD-10-CM | POA: Diagnosis not present

## 2016-09-19 DIAGNOSIS — R269 Unspecified abnormalities of gait and mobility: Secondary | ICD-10-CM | POA: Diagnosis not present

## 2016-09-19 DIAGNOSIS — I340 Nonrheumatic mitral (valve) insufficiency: Secondary | ICD-10-CM | POA: Diagnosis not present

## 2016-09-22 DIAGNOSIS — E113299 Type 2 diabetes mellitus with mild nonproliferative diabetic retinopathy without macular edema, unspecified eye: Secondary | ICD-10-CM | POA: Diagnosis not present

## 2016-09-22 DIAGNOSIS — G3184 Mild cognitive impairment, so stated: Secondary | ICD-10-CM | POA: Diagnosis not present

## 2016-09-22 DIAGNOSIS — I340 Nonrheumatic mitral (valve) insufficiency: Secondary | ICD-10-CM | POA: Diagnosis not present

## 2016-09-22 DIAGNOSIS — I10 Essential (primary) hypertension: Secondary | ICD-10-CM | POA: Diagnosis not present

## 2016-09-22 DIAGNOSIS — R269 Unspecified abnormalities of gait and mobility: Secondary | ICD-10-CM | POA: Diagnosis not present

## 2016-09-22 DIAGNOSIS — R296 Repeated falls: Secondary | ICD-10-CM | POA: Diagnosis not present

## 2016-09-26 DIAGNOSIS — I10 Essential (primary) hypertension: Secondary | ICD-10-CM | POA: Diagnosis not present

## 2016-09-26 DIAGNOSIS — R269 Unspecified abnormalities of gait and mobility: Secondary | ICD-10-CM | POA: Diagnosis not present

## 2016-09-26 DIAGNOSIS — I340 Nonrheumatic mitral (valve) insufficiency: Secondary | ICD-10-CM | POA: Diagnosis not present

## 2016-09-26 DIAGNOSIS — E113299 Type 2 diabetes mellitus with mild nonproliferative diabetic retinopathy without macular edema, unspecified eye: Secondary | ICD-10-CM | POA: Diagnosis not present

## 2016-09-26 DIAGNOSIS — R296 Repeated falls: Secondary | ICD-10-CM | POA: Diagnosis not present

## 2016-09-26 DIAGNOSIS — G3184 Mild cognitive impairment, so stated: Secondary | ICD-10-CM | POA: Diagnosis not present

## 2016-09-29 DIAGNOSIS — R296 Repeated falls: Secondary | ICD-10-CM | POA: Diagnosis not present

## 2016-09-29 DIAGNOSIS — E113299 Type 2 diabetes mellitus with mild nonproliferative diabetic retinopathy without macular edema, unspecified eye: Secondary | ICD-10-CM | POA: Diagnosis not present

## 2016-09-29 DIAGNOSIS — R269 Unspecified abnormalities of gait and mobility: Secondary | ICD-10-CM | POA: Diagnosis not present

## 2016-09-29 DIAGNOSIS — I10 Essential (primary) hypertension: Secondary | ICD-10-CM | POA: Diagnosis not present

## 2016-09-29 DIAGNOSIS — I340 Nonrheumatic mitral (valve) insufficiency: Secondary | ICD-10-CM | POA: Diagnosis not present

## 2016-09-29 DIAGNOSIS — G3184 Mild cognitive impairment, so stated: Secondary | ICD-10-CM | POA: Diagnosis not present

## 2016-10-03 DIAGNOSIS — I340 Nonrheumatic mitral (valve) insufficiency: Secondary | ICD-10-CM | POA: Diagnosis not present

## 2016-10-03 DIAGNOSIS — R296 Repeated falls: Secondary | ICD-10-CM | POA: Diagnosis not present

## 2016-10-03 DIAGNOSIS — E113299 Type 2 diabetes mellitus with mild nonproliferative diabetic retinopathy without macular edema, unspecified eye: Secondary | ICD-10-CM | POA: Diagnosis not present

## 2016-10-03 DIAGNOSIS — G3184 Mild cognitive impairment, so stated: Secondary | ICD-10-CM | POA: Diagnosis not present

## 2016-10-03 DIAGNOSIS — I10 Essential (primary) hypertension: Secondary | ICD-10-CM | POA: Diagnosis not present

## 2016-10-03 DIAGNOSIS — R269 Unspecified abnormalities of gait and mobility: Secondary | ICD-10-CM | POA: Diagnosis not present

## 2016-10-06 DIAGNOSIS — E113299 Type 2 diabetes mellitus with mild nonproliferative diabetic retinopathy without macular edema, unspecified eye: Secondary | ICD-10-CM | POA: Diagnosis not present

## 2016-10-06 DIAGNOSIS — R296 Repeated falls: Secondary | ICD-10-CM | POA: Diagnosis not present

## 2016-10-06 DIAGNOSIS — G3184 Mild cognitive impairment, so stated: Secondary | ICD-10-CM | POA: Diagnosis not present

## 2016-10-06 DIAGNOSIS — I340 Nonrheumatic mitral (valve) insufficiency: Secondary | ICD-10-CM | POA: Diagnosis not present

## 2016-10-06 DIAGNOSIS — R269 Unspecified abnormalities of gait and mobility: Secondary | ICD-10-CM | POA: Diagnosis not present

## 2016-10-06 DIAGNOSIS — I10 Essential (primary) hypertension: Secondary | ICD-10-CM | POA: Diagnosis not present

## 2016-10-11 DIAGNOSIS — E113299 Type 2 diabetes mellitus with mild nonproliferative diabetic retinopathy without macular edema, unspecified eye: Secondary | ICD-10-CM | POA: Diagnosis not present

## 2016-10-11 DIAGNOSIS — E669 Obesity, unspecified: Secondary | ICD-10-CM | POA: Diagnosis not present

## 2016-10-11 DIAGNOSIS — R2689 Other abnormalities of gait and mobility: Secondary | ICD-10-CM | POA: Diagnosis not present

## 2016-10-11 DIAGNOSIS — G3184 Mild cognitive impairment, so stated: Secondary | ICD-10-CM | POA: Diagnosis not present

## 2016-10-11 DIAGNOSIS — Z6833 Body mass index (BMI) 33.0-33.9, adult: Secondary | ICD-10-CM | POA: Diagnosis not present

## 2016-10-11 DIAGNOSIS — I10 Essential (primary) hypertension: Secondary | ICD-10-CM | POA: Diagnosis not present

## 2016-10-13 DIAGNOSIS — E113299 Type 2 diabetes mellitus with mild nonproliferative diabetic retinopathy without macular edema, unspecified eye: Secondary | ICD-10-CM | POA: Diagnosis not present

## 2016-10-13 DIAGNOSIS — E669 Obesity, unspecified: Secondary | ICD-10-CM | POA: Diagnosis not present

## 2016-10-13 DIAGNOSIS — R2689 Other abnormalities of gait and mobility: Secondary | ICD-10-CM | POA: Diagnosis not present

## 2016-10-13 DIAGNOSIS — G3184 Mild cognitive impairment, so stated: Secondary | ICD-10-CM | POA: Diagnosis not present

## 2016-10-13 DIAGNOSIS — Z6833 Body mass index (BMI) 33.0-33.9, adult: Secondary | ICD-10-CM | POA: Diagnosis not present

## 2016-10-13 DIAGNOSIS — I10 Essential (primary) hypertension: Secondary | ICD-10-CM | POA: Diagnosis not present

## 2016-10-16 DIAGNOSIS — R2689 Other abnormalities of gait and mobility: Secondary | ICD-10-CM | POA: Diagnosis not present

## 2016-10-16 DIAGNOSIS — Z6833 Body mass index (BMI) 33.0-33.9, adult: Secondary | ICD-10-CM | POA: Diagnosis not present

## 2016-10-16 DIAGNOSIS — E113299 Type 2 diabetes mellitus with mild nonproliferative diabetic retinopathy without macular edema, unspecified eye: Secondary | ICD-10-CM | POA: Diagnosis not present

## 2016-10-16 DIAGNOSIS — I10 Essential (primary) hypertension: Secondary | ICD-10-CM | POA: Diagnosis not present

## 2016-10-16 DIAGNOSIS — G3184 Mild cognitive impairment, so stated: Secondary | ICD-10-CM | POA: Diagnosis not present

## 2016-10-16 DIAGNOSIS — E669 Obesity, unspecified: Secondary | ICD-10-CM | POA: Diagnosis not present

## 2016-10-18 DIAGNOSIS — E113299 Type 2 diabetes mellitus with mild nonproliferative diabetic retinopathy without macular edema, unspecified eye: Secondary | ICD-10-CM | POA: Diagnosis not present

## 2016-10-18 DIAGNOSIS — R2689 Other abnormalities of gait and mobility: Secondary | ICD-10-CM | POA: Diagnosis not present

## 2016-10-18 DIAGNOSIS — G3184 Mild cognitive impairment, so stated: Secondary | ICD-10-CM | POA: Diagnosis not present

## 2016-10-18 DIAGNOSIS — Z6833 Body mass index (BMI) 33.0-33.9, adult: Secondary | ICD-10-CM | POA: Diagnosis not present

## 2016-10-18 DIAGNOSIS — E669 Obesity, unspecified: Secondary | ICD-10-CM | POA: Diagnosis not present

## 2016-10-18 DIAGNOSIS — I10 Essential (primary) hypertension: Secondary | ICD-10-CM | POA: Diagnosis not present

## 2016-10-20 DIAGNOSIS — I10 Essential (primary) hypertension: Secondary | ICD-10-CM | POA: Diagnosis not present

## 2016-10-20 DIAGNOSIS — E669 Obesity, unspecified: Secondary | ICD-10-CM | POA: Diagnosis not present

## 2016-10-20 DIAGNOSIS — G3184 Mild cognitive impairment, so stated: Secondary | ICD-10-CM | POA: Diagnosis not present

## 2016-10-20 DIAGNOSIS — E113299 Type 2 diabetes mellitus with mild nonproliferative diabetic retinopathy without macular edema, unspecified eye: Secondary | ICD-10-CM | POA: Diagnosis not present

## 2016-10-20 DIAGNOSIS — R2689 Other abnormalities of gait and mobility: Secondary | ICD-10-CM | POA: Diagnosis not present

## 2016-10-20 DIAGNOSIS — Z6833 Body mass index (BMI) 33.0-33.9, adult: Secondary | ICD-10-CM | POA: Diagnosis not present

## 2016-10-25 DIAGNOSIS — E113299 Type 2 diabetes mellitus with mild nonproliferative diabetic retinopathy without macular edema, unspecified eye: Secondary | ICD-10-CM | POA: Diagnosis not present

## 2016-10-25 DIAGNOSIS — E669 Obesity, unspecified: Secondary | ICD-10-CM | POA: Diagnosis not present

## 2016-10-25 DIAGNOSIS — Z6833 Body mass index (BMI) 33.0-33.9, adult: Secondary | ICD-10-CM | POA: Diagnosis not present

## 2016-10-25 DIAGNOSIS — R2689 Other abnormalities of gait and mobility: Secondary | ICD-10-CM | POA: Diagnosis not present

## 2016-10-25 DIAGNOSIS — I10 Essential (primary) hypertension: Secondary | ICD-10-CM | POA: Diagnosis not present

## 2016-10-25 DIAGNOSIS — G3184 Mild cognitive impairment, so stated: Secondary | ICD-10-CM | POA: Diagnosis not present

## 2016-10-26 DIAGNOSIS — H353211 Exudative age-related macular degeneration, right eye, with active choroidal neovascularization: Secondary | ICD-10-CM | POA: Diagnosis not present

## 2016-10-27 DIAGNOSIS — E669 Obesity, unspecified: Secondary | ICD-10-CM | POA: Diagnosis not present

## 2016-10-27 DIAGNOSIS — Z6833 Body mass index (BMI) 33.0-33.9, adult: Secondary | ICD-10-CM | POA: Diagnosis not present

## 2016-10-27 DIAGNOSIS — G3184 Mild cognitive impairment, so stated: Secondary | ICD-10-CM | POA: Diagnosis not present

## 2016-10-27 DIAGNOSIS — E113299 Type 2 diabetes mellitus with mild nonproliferative diabetic retinopathy without macular edema, unspecified eye: Secondary | ICD-10-CM | POA: Diagnosis not present

## 2016-10-27 DIAGNOSIS — R2689 Other abnormalities of gait and mobility: Secondary | ICD-10-CM | POA: Diagnosis not present

## 2016-10-27 DIAGNOSIS — I10 Essential (primary) hypertension: Secondary | ICD-10-CM | POA: Diagnosis not present

## 2016-10-30 DIAGNOSIS — E113299 Type 2 diabetes mellitus with mild nonproliferative diabetic retinopathy without macular edema, unspecified eye: Secondary | ICD-10-CM | POA: Diagnosis not present

## 2016-10-30 DIAGNOSIS — G3184 Mild cognitive impairment, so stated: Secondary | ICD-10-CM | POA: Diagnosis not present

## 2016-10-30 DIAGNOSIS — E669 Obesity, unspecified: Secondary | ICD-10-CM | POA: Diagnosis not present

## 2016-10-30 DIAGNOSIS — R2689 Other abnormalities of gait and mobility: Secondary | ICD-10-CM | POA: Diagnosis not present

## 2016-10-30 DIAGNOSIS — Z6833 Body mass index (BMI) 33.0-33.9, adult: Secondary | ICD-10-CM | POA: Diagnosis not present

## 2016-10-30 DIAGNOSIS — I10 Essential (primary) hypertension: Secondary | ICD-10-CM | POA: Diagnosis not present

## 2016-10-31 ENCOUNTER — Ambulatory Visit: Payer: Medicare Other | Admitting: Sports Medicine

## 2016-10-31 ENCOUNTER — Ambulatory Visit (INDEPENDENT_AMBULATORY_CARE_PROVIDER_SITE_OTHER): Payer: Medicare Other | Admitting: Sports Medicine

## 2016-10-31 DIAGNOSIS — B351 Tinea unguium: Secondary | ICD-10-CM

## 2016-10-31 DIAGNOSIS — Q828 Other specified congenital malformations of skin: Secondary | ICD-10-CM

## 2016-10-31 DIAGNOSIS — E1151 Type 2 diabetes mellitus with diabetic peripheral angiopathy without gangrene: Secondary | ICD-10-CM

## 2016-10-31 DIAGNOSIS — M2010 Hallux valgus (acquired), unspecified foot: Secondary | ICD-10-CM

## 2016-10-31 DIAGNOSIS — E1140 Type 2 diabetes mellitus with diabetic neuropathy, unspecified: Secondary | ICD-10-CM

## 2016-10-31 NOTE — Progress Notes (Signed)
After routine nail care visit with me. Patient met with Liliane Channel and was casted for Diabetic shoes and insoles . Patient to return to [facility].   Landis Martins, DPM

## 2016-10-31 NOTE — Progress Notes (Signed)
Subjective: Heidi Dominguez is a 83 y.o. female patient with history of diabetes who presents to office today complaining of long, painful nails  while ambulating in shoes; unable to trim. Patient states that the glucose reading this morning was 188mg /dl. Patient admits to adding Claritin to medications. Patient denies any new cramping, numbness, burning or tingling in the legs. Admits to unchanged soreness to legs and to bunion and hammertoes especially on the left foot and is here for diabetic shoes.   Patient is assisted by daughter who helps with her care.   Patient Active Problem List   Diagnosis Date Noted  . Choledocholithiasis with chronic cholecystitis, nonobstructing 04/11/2015  . Pre-operative cardiovascular examination, high risk surgery   . Epigastric abdominal pain 04/08/2015  . Acute gallstone pancreatitis s/p lap chole w [facility] 04/11/2015 04/08/2015  . Diabetes mellitus type 2, controlled ([place]) 04/08/2015  . Gallstone pancreatitis 04/08/2015  . Mitral regurgitation 04/08/2015  . Chronic diastolic CHF (congestive heart failure) ([place]) 04/08/2015  . Dyspnea 03/31/2015  . Ischemic colitis ([place]) 08/09/2012  . Colitis 08/07/2012  . Rectal bleeding 08/07/2012  . Weakness 09/24/2011  . Bradycardia 09/24/2011  . UTI (lower urinary tract infection) 09/24/2011  . Hematochezia 09/24/2011  . DM (diabetes mellitus) ([place]) 09/24/2011  . Essential hypertension 09/24/2011  . Dyslipidemia 09/24/2011   Current Outpatient Prescriptions on File Prior to Visit  Medication Sig Dispense Refill  . acetaminophen (TYLENOL) 325 MG tablet Take 2 tablets (650 mg total) by mouth every 6 (six) hours as needed (or Fever >/= 101).    Marland Kitchen albuterol (PROVENTIL HFA;VENTOLIN HFA) 108 (90 Base) MCG/ACT inhaler Inhale 2 puffs into the lungs every 4 (four) hours as needed for wheezing or shortness of breath.    Marland Kitchen amLODipine (NORVASC) 10 MG tablet Take 1 tablet (10 mg total) by mouth daily.    Marland Kitchen aspirin 81 MG tablet  Take 81 mg by mouth daily.    . budesonide-formoterol (SYMBICORT) 160-4.5 MCG/ACT inhaler Inhale 2 puffs into the lungs 2 (two) times daily.    . Cholecalciferol (VITAMIN D3) 5000 UNITS CAPS Take 1 capsule by mouth daily.    . Ferrous Gluconate (IRON) 240 (27 FE) MG TABS Take 1 tablet by mouth daily.    . furosemide (LASIX) 20 MG tablet 1 TABLET (20 MG) BY MOUTH DAILY  6  . JANUVIA 50 MG tablet 1 TABLET (50 MG) BY MOUTH DAILY  5  . levalbuterol (XOPENEX) 1.25 MG/3ML nebulizer solution Take 1.25 mg by nebulization every 4 (four) hours as needed for wheezing.    . Magnesium 250 MG TABS Take 250 mg by mouth daily.     . mirabegron ER (MYRBETRIQ) 50 MG TB24 tablet Take 50 mg by mouth daily.    . nitrofurantoin, macrocrystal-monohydrate, (MACROBID) 100 MG capsule Take 1 capsule (100 mg total) by mouth 2 (two) times daily. 10 capsule 0  . ondansetron (ZOFRAN ODT) 4 MG disintegrating tablet Take 1 tablet (4 mg total) by mouth every 8 (eight) hours as needed for nausea. 6 tablet 0  . potassium chloride (K-DUR,KLOR-CON) 10 MEQ tablet Take 10 mEq by mouth daily.     . pravastatin (PRAVACHOL) 40 MG tablet Take 40 mg by mouth daily.    . Protein (PROCEL) POWD Take 1 scoop by mouth 2 (two) times daily.     . ranitidine (ZANTAC) 150 MG tablet Take 1 tablet (150 mg total) by mouth 2 (two) times daily. 60 tablet 1  . traMADol (ULTRAM) 50 MG tablet Take  1-2 tablets (50-100 mg total) by mouth every 6 (six) hours as needed for moderate pain or severe pain. 20 tablet 0  . VENTOLIN HFA 108 (90 BASE) MCG/ACT inhaler INHALE 2 PUFFS Q 4 H PRN  4  . vitamin B-12 (CYANOCOBALAMIN) 500 MCG tablet Take 500 mcg by mouth daily.     No current facility-administered medications on file prior to visit.    Allergies  Allergen Reactions  . Catapres [Clonidine Hcl] Other (See Comments)    Unknown. Unknown.  Lorrin Goodell [Verapamil Hcl] Other (See Comments)    Unknown.  . Other Other (See Comments)    Any type of  narcotics Feels "loopy" Any type of narcotics Feels "loopy"  . Sulfa Antibiotics Itching  . Sulfamethoxazole Diarrhea  . Verapamil Diarrhea and Other (See Comments)  . Sulfasalazine Itching    No results found for this or any previous visit (from the past 2160 hour(s)).  Objective: General: Patient is awake, alert, and oriented x 3 and in no acute distress.  Integument: Skin is warm, dry and supple bilateral. Nails are tender, long, thickened and dystrophic with subungual debris, consistent with onychomycosis, 1-5 bilateral. No signs of infection. No open lesions or preulcerative lesions present bilateral. Mild reactive keratosis over left bunion. Remaining integument unremarkable.  Vasculature:  Dorsalis Pedis pulse 1/4 bilateral. Posterior Tibial pulse  0/4 bilateral. Capillary fill time <3 sec 1-5 bilateral. NO hair growth to the level of the digits.Temperature gradient within normal limits. + varicosities present bilateral. Trace edema present bilateral.   Neurology: The patient has intact sensation measured with a 5.07/10g Semmes Weinstein Monofilament at all pedal sites bilateral . Vibratory sensation diminished bilateral with tuning fork. No Babinski sign present bilateral.   Musculoskeletal: + Bunion, hammertoes, pes planus symptomatic pedal deformities noted bilateral. Muscular strength 4/5 in all lower extremity muscular groups bilateral without pain on range of motion. No tenderness with calf compression bilateral.  Assessment and Plan: Problem List Items Addressed This Visit    None    Visit Diagnoses    Dermatophytosis of nail    -  Primary   Porokeratosis       Type 2 diabetes, controlled, with neuropathy ([place])       Diabetic peripheral vascular disorder ([place])         -Examined patient. -Discussed and educated patient on diabetic foot care, especially with regards to the vascular, neurological and musculoskeletal systems.  -Stressed the importance of good glycemic  control and the detriment of not controlling glucose levels in relation to the foot. -Mechanically debrided all nails 1-5 bilateral using sterile nail nipper and filed with dremel without incident  -Continue with bunion and hammertoe padding for left -Recommend good supportive shoes -Recommend daily skin emollients -Patient was fitted and measured today for diabetic shoes -Answered all patient questions -Patient to return  in 3 months for at risk foot care and Pick up diabetic shoes -Patient advised to call the office if any problems or questions arise in the meantime.  Landis Martins, DPM

## 2016-11-01 DIAGNOSIS — I10 Essential (primary) hypertension: Secondary | ICD-10-CM | POA: Diagnosis not present

## 2016-11-01 DIAGNOSIS — E113299 Type 2 diabetes mellitus with mild nonproliferative diabetic retinopathy without macular edema, unspecified eye: Secondary | ICD-10-CM | POA: Diagnosis not present

## 2016-11-01 DIAGNOSIS — Z6833 Body mass index (BMI) 33.0-33.9, adult: Secondary | ICD-10-CM | POA: Diagnosis not present

## 2016-11-01 DIAGNOSIS — G3184 Mild cognitive impairment, so stated: Secondary | ICD-10-CM | POA: Diagnosis not present

## 2016-11-01 DIAGNOSIS — R2689 Other abnormalities of gait and mobility: Secondary | ICD-10-CM | POA: Diagnosis not present

## 2016-11-01 DIAGNOSIS — E669 Obesity, unspecified: Secondary | ICD-10-CM | POA: Diagnosis not present

## 2016-11-03 DIAGNOSIS — I10 Essential (primary) hypertension: Secondary | ICD-10-CM | POA: Diagnosis not present

## 2016-11-03 DIAGNOSIS — R2689 Other abnormalities of gait and mobility: Secondary | ICD-10-CM | POA: Diagnosis not present

## 2016-11-03 DIAGNOSIS — E113299 Type 2 diabetes mellitus with mild nonproliferative diabetic retinopathy without macular edema, unspecified eye: Secondary | ICD-10-CM | POA: Diagnosis not present

## 2016-11-03 DIAGNOSIS — E669 Obesity, unspecified: Secondary | ICD-10-CM | POA: Diagnosis not present

## 2016-11-03 DIAGNOSIS — Z6833 Body mass index (BMI) 33.0-33.9, adult: Secondary | ICD-10-CM | POA: Diagnosis not present

## 2016-11-03 DIAGNOSIS — G3184 Mild cognitive impairment, so stated: Secondary | ICD-10-CM | POA: Diagnosis not present

## 2016-11-10 DIAGNOSIS — I10 Essential (primary) hypertension: Secondary | ICD-10-CM | POA: Diagnosis not present

## 2016-11-10 DIAGNOSIS — E113299 Type 2 diabetes mellitus with mild nonproliferative diabetic retinopathy without macular edema, unspecified eye: Secondary | ICD-10-CM | POA: Diagnosis not present

## 2016-11-10 DIAGNOSIS — R2689 Other abnormalities of gait and mobility: Secondary | ICD-10-CM | POA: Diagnosis not present

## 2016-11-10 DIAGNOSIS — Z6833 Body mass index (BMI) 33.0-33.9, adult: Secondary | ICD-10-CM | POA: Diagnosis not present

## 2016-11-10 DIAGNOSIS — E669 Obesity, unspecified: Secondary | ICD-10-CM | POA: Diagnosis not present

## 2016-11-10 DIAGNOSIS — G3184 Mild cognitive impairment, so stated: Secondary | ICD-10-CM | POA: Diagnosis not present

## 2016-11-13 DIAGNOSIS — E113299 Type 2 diabetes mellitus with mild nonproliferative diabetic retinopathy without macular edema, unspecified eye: Secondary | ICD-10-CM | POA: Diagnosis not present

## 2016-11-13 DIAGNOSIS — Z6833 Body mass index (BMI) 33.0-33.9, adult: Secondary | ICD-10-CM | POA: Diagnosis not present

## 2016-11-13 DIAGNOSIS — R2689 Other abnormalities of gait and mobility: Secondary | ICD-10-CM | POA: Diagnosis not present

## 2016-11-13 DIAGNOSIS — I10 Essential (primary) hypertension: Secondary | ICD-10-CM | POA: Diagnosis not present

## 2016-11-13 DIAGNOSIS — G3184 Mild cognitive impairment, so stated: Secondary | ICD-10-CM | POA: Diagnosis not present

## 2016-11-13 DIAGNOSIS — E669 Obesity, unspecified: Secondary | ICD-10-CM | POA: Diagnosis not present

## 2016-11-16 DIAGNOSIS — Z6833 Body mass index (BMI) 33.0-33.9, adult: Secondary | ICD-10-CM | POA: Diagnosis not present

## 2016-11-16 DIAGNOSIS — E669 Obesity, unspecified: Secondary | ICD-10-CM | POA: Diagnosis not present

## 2016-11-16 DIAGNOSIS — E113299 Type 2 diabetes mellitus with mild nonproliferative diabetic retinopathy without macular edema, unspecified eye: Secondary | ICD-10-CM | POA: Diagnosis not present

## 2016-11-16 DIAGNOSIS — I10 Essential (primary) hypertension: Secondary | ICD-10-CM | POA: Diagnosis not present

## 2016-11-16 DIAGNOSIS — G3184 Mild cognitive impairment, so stated: Secondary | ICD-10-CM | POA: Diagnosis not present

## 2016-11-16 DIAGNOSIS — R2689 Other abnormalities of gait and mobility: Secondary | ICD-10-CM | POA: Diagnosis not present

## 2016-11-17 DIAGNOSIS — E113299 Type 2 diabetes mellitus with mild nonproliferative diabetic retinopathy without macular edema, unspecified eye: Secondary | ICD-10-CM | POA: Diagnosis not present

## 2016-11-17 DIAGNOSIS — G3184 Mild cognitive impairment, so stated: Secondary | ICD-10-CM | POA: Diagnosis not present

## 2016-11-17 DIAGNOSIS — R2689 Other abnormalities of gait and mobility: Secondary | ICD-10-CM | POA: Diagnosis not present

## 2016-11-17 DIAGNOSIS — Z6833 Body mass index (BMI) 33.0-33.9, adult: Secondary | ICD-10-CM | POA: Diagnosis not present

## 2016-11-17 DIAGNOSIS — I10 Essential (primary) hypertension: Secondary | ICD-10-CM | POA: Diagnosis not present

## 2016-11-17 DIAGNOSIS — E669 Obesity, unspecified: Secondary | ICD-10-CM | POA: Diagnosis not present

## 2016-12-04 DIAGNOSIS — H903 Sensorineural hearing loss, bilateral: Secondary | ICD-10-CM | POA: Diagnosis not present

## 2016-12-04 DIAGNOSIS — L299 Pruritus, unspecified: Secondary | ICD-10-CM | POA: Diagnosis not present

## 2016-12-04 DIAGNOSIS — H6123 Impacted cerumen, bilateral: Secondary | ICD-10-CM | POA: Diagnosis not present

## 2016-12-04 DIAGNOSIS — H938X3 Other specified disorders of ear, bilateral: Secondary | ICD-10-CM | POA: Diagnosis not present

## 2016-12-07 DIAGNOSIS — H353211 Exudative age-related macular degeneration, right eye, with active choroidal neovascularization: Secondary | ICD-10-CM | POA: Diagnosis not present

## 2016-12-08 ENCOUNTER — Other Ambulatory Visit: Payer: Medicare Other

## 2016-12-13 DIAGNOSIS — D696 Thrombocytopenia, unspecified: Secondary | ICD-10-CM | POA: Diagnosis not present

## 2016-12-13 DIAGNOSIS — E113299 Type 2 diabetes mellitus with mild nonproliferative diabetic retinopathy without macular edema, unspecified eye: Secondary | ICD-10-CM | POA: Diagnosis not present

## 2016-12-13 DIAGNOSIS — Z1389 Encounter for screening for other disorder: Secondary | ICD-10-CM | POA: Diagnosis not present

## 2016-12-13 DIAGNOSIS — R269 Unspecified abnormalities of gait and mobility: Secondary | ICD-10-CM | POA: Diagnosis not present

## 2016-12-13 DIAGNOSIS — E7800 Pure hypercholesterolemia, unspecified: Secondary | ICD-10-CM | POA: Diagnosis not present

## 2016-12-13 DIAGNOSIS — Z7984 Long term (current) use of oral hypoglycemic drugs: Secondary | ICD-10-CM | POA: Diagnosis not present

## 2016-12-13 DIAGNOSIS — Z79899 Other long term (current) drug therapy: Secondary | ICD-10-CM | POA: Diagnosis not present

## 2016-12-13 DIAGNOSIS — E1165 Type 2 diabetes mellitus with hyperglycemia: Secondary | ICD-10-CM | POA: Diagnosis not present

## 2016-12-13 DIAGNOSIS — I10 Essential (primary) hypertension: Secondary | ICD-10-CM | POA: Diagnosis not present

## 2016-12-13 DIAGNOSIS — Z0000 Encounter for general adult medical examination without abnormal findings: Secondary | ICD-10-CM | POA: Diagnosis not present

## 2017-01-03 DIAGNOSIS — D696 Thrombocytopenia, unspecified: Secondary | ICD-10-CM | POA: Diagnosis not present

## 2017-01-03 DIAGNOSIS — Z79899 Other long term (current) drug therapy: Secondary | ICD-10-CM | POA: Diagnosis not present

## 2017-01-05 DIAGNOSIS — I10 Essential (primary) hypertension: Secondary | ICD-10-CM | POA: Diagnosis not present

## 2017-01-05 DIAGNOSIS — R0602 Shortness of breath: Secondary | ICD-10-CM | POA: Diagnosis not present

## 2017-01-05 DIAGNOSIS — E113299 Type 2 diabetes mellitus with mild nonproliferative diabetic retinopathy without macular edema, unspecified eye: Secondary | ICD-10-CM | POA: Diagnosis not present

## 2017-01-05 DIAGNOSIS — Z7984 Long term (current) use of oral hypoglycemic drugs: Secondary | ICD-10-CM | POA: Diagnosis not present

## 2017-01-05 DIAGNOSIS — D696 Thrombocytopenia, unspecified: Secondary | ICD-10-CM | POA: Diagnosis not present

## 2017-01-05 DIAGNOSIS — I340 Nonrheumatic mitral (valve) insufficiency: Secondary | ICD-10-CM | POA: Diagnosis not present

## 2017-01-11 DIAGNOSIS — E113299 Type 2 diabetes mellitus with mild nonproliferative diabetic retinopathy without macular edema, unspecified eye: Secondary | ICD-10-CM | POA: Diagnosis not present

## 2017-01-11 DIAGNOSIS — R062 Wheezing: Secondary | ICD-10-CM | POA: Diagnosis not present

## 2017-01-11 DIAGNOSIS — I5032 Chronic diastolic (congestive) heart failure: Secondary | ICD-10-CM | POA: Diagnosis not present

## 2017-01-11 DIAGNOSIS — Z7984 Long term (current) use of oral hypoglycemic drugs: Secondary | ICD-10-CM | POA: Diagnosis not present

## 2017-01-11 DIAGNOSIS — I10 Essential (primary) hypertension: Secondary | ICD-10-CM | POA: Diagnosis not present

## 2017-01-18 DIAGNOSIS — H353211 Exudative age-related macular degeneration, right eye, with active choroidal neovascularization: Secondary | ICD-10-CM | POA: Diagnosis not present

## 2017-01-30 ENCOUNTER — Ambulatory Visit (INDEPENDENT_AMBULATORY_CARE_PROVIDER_SITE_OTHER): Payer: Medicare Other | Admitting: Sports Medicine

## 2017-01-30 ENCOUNTER — Telehealth: Payer: Self-pay | Admitting: Sports Medicine

## 2017-01-30 ENCOUNTER — Encounter: Payer: Self-pay | Admitting: Sports Medicine

## 2017-01-30 DIAGNOSIS — E1151 Type 2 diabetes mellitus with diabetic peripheral angiopathy without gangrene: Secondary | ICD-10-CM | POA: Diagnosis not present

## 2017-01-30 DIAGNOSIS — Q828 Other specified congenital malformations of skin: Secondary | ICD-10-CM

## 2017-01-30 DIAGNOSIS — M79672 Pain in left foot: Secondary | ICD-10-CM

## 2017-01-30 DIAGNOSIS — E1140 Type 2 diabetes mellitus with diabetic neuropathy, unspecified: Secondary | ICD-10-CM

## 2017-01-30 DIAGNOSIS — B351 Tinea unguium: Secondary | ICD-10-CM

## 2017-01-30 DIAGNOSIS — M79676 Pain in unspecified toe(s): Secondary | ICD-10-CM | POA: Diagnosis not present

## 2017-01-30 DIAGNOSIS — R2681 Unsteadiness on feet: Secondary | ICD-10-CM

## 2017-01-30 DIAGNOSIS — M79671 Pain in right foot: Secondary | ICD-10-CM

## 2017-01-30 NOTE — Patient Instructions (Addendum)
[place] healthy cream for dry skin can purchase OTC from [employer] or [employer]  For nursing aid, call office to speak with Marcy Siren our office nurse to assist with getting help at home, office # is [phone]

## 2017-01-30 NOTE — Progress Notes (Signed)
Subjective: Heidi Dominguez is a 83 y.o. female patient with history of diabetes who presents to office today complaining of long, painful nails  while ambulating in shoes; unable to trim. Patient states that the glucose reading this morning was 121 mg/dl. Patient denies any changes to medical history medications since last visit.  Patient is assisted by family friend who helps with her care. However, patient desires to have a home health aide.  Patient Active Problem List   Diagnosis Date Noted  . Choledocholithiasis with chronic cholecystitis, nonobstructing 04/11/2015  . Pre-operative cardiovascular examination, high risk surgery   . Epigastric abdominal pain 04/08/2015  . Acute gallstone pancreatitis s/p lap chole w [facility] 04/11/2015 04/08/2015  . Diabetes mellitus type 2, controlled ([place]) 04/08/2015  . Gallstone pancreatitis 04/08/2015  . Mitral regurgitation 04/08/2015  . Chronic diastolic CHF (congestive heart failure) ([place]) 04/08/2015  . Dyspnea 03/31/2015  . Ischemic colitis ([place]) 08/09/2012  . Colitis 08/07/2012  . Rectal bleeding 08/07/2012  . Weakness 09/24/2011  . Bradycardia 09/24/2011  . UTI (lower urinary tract infection) 09/24/2011  . Hematochezia 09/24/2011  . DM (diabetes mellitus) ([place]) 09/24/2011  . Essential hypertension 09/24/2011  . Dyslipidemia 09/24/2011   Current Outpatient Prescriptions on File Prior to Visit  Medication Sig Dispense Refill  . acetaminophen (TYLENOL) 325 MG tablet Take 2 tablets (650 mg total) by mouth every 6 (six) hours as needed (or Fever >/= 101).    Marland Kitchen albuterol (PROVENTIL HFA;VENTOLIN HFA) 108 (90 Base) MCG/ACT inhaler Inhale 2 puffs into the lungs every 4 (four) hours as needed for wheezing or shortness of breath.    Marland Kitchen amLODipine (NORVASC) 10 MG tablet Take 1 tablet (10 mg total) by mouth daily.    Marland Kitchen aspirin 81 MG tablet Take 81 mg by mouth daily.    . budesonide-formoterol (SYMBICORT) 160-4.5 MCG/ACT inhaler Inhale 2 puffs into the  lungs 2 (two) times daily.    . Cholecalciferol (VITAMIN D3) 5000 UNITS CAPS Take 1 capsule by mouth daily.    . Ferrous Gluconate (IRON) 240 (27 FE) MG TABS Take 1 tablet by mouth daily.    . furosemide (LASIX) 20 MG tablet 1 TABLET (20 MG) BY MOUTH DAILY  6  . JANUVIA 50 MG tablet 1 TABLET (50 MG) BY MOUTH DAILY  5  . levalbuterol (XOPENEX) 1.25 MG/3ML nebulizer solution Take 1.25 mg by nebulization every 4 (four) hours as needed for wheezing.    . Magnesium 250 MG TABS Take 250 mg by mouth daily.     . mirabegron ER (MYRBETRIQ) 50 MG TB24 tablet Take 50 mg by mouth daily.    . nitrofurantoin, macrocrystal-monohydrate, (MACROBID) 100 MG capsule Take 1 capsule (100 mg total) by mouth 2 (two) times daily. 10 capsule 0  . ondansetron (ZOFRAN ODT) 4 MG disintegrating tablet Take 1 tablet (4 mg total) by mouth every 8 (eight) hours as needed for nausea. 6 tablet 0  . potassium chloride (K-DUR,KLOR-CON) 10 MEQ tablet Take 10 mEq by mouth daily.     . pravastatin (PRAVACHOL) 40 MG tablet Take 40 mg by mouth daily.    . Protein (PROCEL) POWD Take 1 scoop by mouth 2 (two) times daily.     . traMADol (ULTRAM) 50 MG tablet Take 1-2 tablets (50-100 mg total) by mouth every 6 (six) hours as needed for moderate pain or severe pain. 20 tablet 0  . VENTOLIN HFA 108 (90 BASE) MCG/ACT inhaler INHALE 2 PUFFS Q 4 H PRN  4  . vitamin  B-12 (CYANOCOBALAMIN) 500 MCG tablet Take 500 mcg by mouth daily.    . ranitidine (ZANTAC) 150 MG tablet Take 1 tablet (150 mg total) by mouth 2 (two) times daily. 60 tablet 1   No current facility-administered medications on file prior to visit.    Allergies  Allergen Reactions  . Catapres [Clonidine Hcl] Other (See Comments)    Unknown. Unknown.  Lorrin Goodell [Verapamil Hcl] Other (See Comments)    Unknown.  . Other Other (See Comments)    Any type of narcotics Feels "loopy" Any type of narcotics Feels "loopy"  . Sulfa Antibiotics Itching  . Sulfamethoxazole Diarrhea  .  Verapamil Diarrhea and Other (See Comments)  . Sulfasalazine Itching    No results found for this or any previous visit (from the past 2160 hour(s)).  Objective: General: Patient is awake, alert, and oriented x 3 and in no acute distress.  Integument: Skin is warm, dry and supple bilateral. Nails are tender, long, thickened and dystrophic with subungual debris, consistent with onychomycosis, 1-5 bilateral. No signs of infection. No open lesions. Mild reactive keratosis over left bunion. Remaining integument unremarkable.  Vasculature:  Dorsalis Pedis pulse 1/4 bilateral. Posterior Tibial pulse  0/4 bilateral. Capillary fill time <3 sec 1-5 bilateral. No hair growth to the level of the digits.Temperature gradient within normal limits. + varicosities present bilateral. Trace edema present bilateral.   Neurology: The patient has intact sensation measured with a 5.07/10g Semmes Weinstein Monofilament at all pedal sites bilateral . Vibratory sensation diminished bilateral with tuning fork. No Babinski sign present bilateral.   Musculoskeletal: + Bunion, hammertoes, pes planus symptomatic pedal deformities noted bilateral. Muscular strength 4/5 in all lower extremity muscular groups bilateral without pain on range of motion. No tenderness with calf compression bilateral.  Gait rolling walker assisted with history of falls  Assessment and Plan: Problem List Items Addressed This Visit    None    Visit Diagnoses    Dermatophytosis of nail    -  Primary   Porokeratosis       Type 2 diabetes, controlled, with neuropathy (HCC)       Diabetic peripheral vascular disorder (HCC)       Foot pain, bilateral       Gait instability         -Examined patient. -Discussed and educated patient on diabetic foot care, especially with regards to the vascular, neurological and musculoskeletal systems.  -Stressed the importance of good glycemic control and the detriment of not controlling glucose levels in  relation to the foot. -Mechanically debrided callus 1 at left foot using sterile chisel blade and all nails 1-5 bilateral using sterile nail nipper and filed with dremel without incident  -Continue with bunion and hammertoe padding for left; dancer's pad applied -Recommend daily skin emollients/[place] healthy feet -Patient's diabetic shoes were too small and was sent back for wider size; patient to meet with Liliane Channel for dispensing  -Advised patient to have daughter to call the office if they want to move forward with [place] helping them get a company to provide a home health aide for them -Answered all patient questions -Patient to return  in 3 months for at risk foot care and Pick up diabetic shoes when ready -Patient advised to call the office if any problems or questions arise in the meantime.  Landis Martins, DPM

## 2017-01-30 NOTE — Telephone Encounter (Signed)
Please contact patient when diabetic shoes are in office. Patient needs to be on Rick's schedule.

## 2017-02-05 DIAGNOSIS — Z79899 Other long term (current) drug therapy: Secondary | ICD-10-CM | POA: Diagnosis not present

## 2017-02-06 ENCOUNTER — Telehealth: Payer: Self-pay | Admitting: *Deleted

## 2017-02-06 NOTE — Telephone Encounter (Signed)
Pt left name and phone number. I spoke with pt and she asked if her diabetic shoes were available. I spoke with R. Puckett, Pedorthist and he said the proper size shoe had arrived and pt needed to make an appt to pick up the shoes. I informed pt and transferred to schedulers.

## 2017-02-12 ENCOUNTER — Ambulatory Visit (INDEPENDENT_AMBULATORY_CARE_PROVIDER_SITE_OTHER): Payer: Medicare Other | Admitting: Orthotics

## 2017-02-12 DIAGNOSIS — M2040 Other hammer toe(s) (acquired), unspecified foot: Secondary | ICD-10-CM

## 2017-02-12 DIAGNOSIS — M2010 Hallux valgus (acquired), unspecified foot: Secondary | ICD-10-CM

## 2017-02-12 DIAGNOSIS — E1140 Type 2 diabetes mellitus with diabetic neuropathy, unspecified: Secondary | ICD-10-CM

## 2017-02-13 ENCOUNTER — Telehealth: Payer: Self-pay | Admitting: Sports Medicine

## 2017-02-13 NOTE — Progress Notes (Signed)

## 2017-02-13 NOTE — Telephone Encounter (Signed)
Hello Heidi Dominguez, this is Scientist, research (life sciences), Shannette Reagor's daughter calling. I was looking at Dr. Leeanne Rio last notes and I saw where she told me to contact you if I wanted to proceed with Home Health Aid for my mom. If you would, please call me back at [phone]. Thank you.

## 2017-02-16 NOTE — Telephone Encounter (Signed)
-----   Message from Landis Martins, [place] sent at 01/30/2017 12:23 PM EDT ----- Regarding: Home nursing aid Patient's daughter may call on behalf patient for more information out getting a nursing aid at home to assist with ADL likely self care and bathing. Also patient has a issue with falls and will benefit from home OT and PT. I told her to call office if interested in this service and we can send orders to [facility]. Thanks Dr. Cannon Kettle

## 2017-02-19 NOTE — Telephone Encounter (Addendum)
Unable to leave message at pt's dtr, [name]'s home phone, left message on dtr's work and mobile phones informing pt was being referred to [facility] [phone]. Faxed required form, clinicals and demographics to [place]. Pt's dtr, [name] states she would like [facility] to contact her and she would like to speak to someone in management concerning pt's diabetic shoes, pt does not like and would like to return. I told her I would refax [facility]'s required form with Addendum to contact her [phone], and send a message to S. Edsel Petrin, Immunologist to contact her re the diabetic shoes.

## 2017-02-20 DIAGNOSIS — M6281 Muscle weakness (generalized): Secondary | ICD-10-CM | POA: Diagnosis not present

## 2017-02-20 DIAGNOSIS — I5032 Chronic diastolic (congestive) heart failure: Secondary | ICD-10-CM | POA: Diagnosis not present

## 2017-02-20 DIAGNOSIS — M1990 Unspecified osteoarthritis, unspecified site: Secondary | ICD-10-CM | POA: Diagnosis not present

## 2017-02-20 DIAGNOSIS — I340 Nonrheumatic mitral (valve) insufficiency: Secondary | ICD-10-CM | POA: Diagnosis not present

## 2017-02-20 DIAGNOSIS — Z87440 Personal history of urinary (tract) infections: Secondary | ICD-10-CM | POA: Diagnosis not present

## 2017-02-20 DIAGNOSIS — E1140 Type 2 diabetes mellitus with diabetic neuropathy, unspecified: Secondary | ICD-10-CM | POA: Diagnosis not present

## 2017-02-20 DIAGNOSIS — Z9181 History of falling: Secondary | ICD-10-CM | POA: Diagnosis not present

## 2017-02-20 DIAGNOSIS — I110 Hypertensive heart disease with heart failure: Secondary | ICD-10-CM | POA: Diagnosis not present

## 2017-02-22 ENCOUNTER — Telehealth: Payer: Self-pay | Admitting: Sports Medicine

## 2017-02-22 DIAGNOSIS — M1990 Unspecified osteoarthritis, unspecified site: Secondary | ICD-10-CM | POA: Diagnosis not present

## 2017-02-22 DIAGNOSIS — I110 Hypertensive heart disease with heart failure: Secondary | ICD-10-CM | POA: Diagnosis not present

## 2017-02-22 DIAGNOSIS — I340 Nonrheumatic mitral (valve) insufficiency: Secondary | ICD-10-CM | POA: Diagnosis not present

## 2017-02-22 DIAGNOSIS — M6281 Muscle weakness (generalized): Secondary | ICD-10-CM | POA: Diagnosis not present

## 2017-02-22 DIAGNOSIS — E1140 Type 2 diabetes mellitus with diabetic neuropathy, unspecified: Secondary | ICD-10-CM | POA: Diagnosis not present

## 2017-02-22 DIAGNOSIS — I5032 Chronic diastolic (congestive) heart failure: Secondary | ICD-10-CM | POA: Diagnosis not present

## 2017-02-22 NOTE — Telephone Encounter (Signed)
This is Heidi Dominguez, and occupational therapist with [facility]. I saw the pt today for her home health occupational therapy and I am needing verbal orders to do home health occupational therapy for 1 time for 1 week, then 2 times for 4 weeks, and then 1 time for 1 week. You can reach me at [phone]. Thank you.

## 2017-02-23 NOTE — Telephone Encounter (Signed)
I informed Celesta Gentile, Dr. Marcene Duos their plans for OT for pt.

## 2017-02-26 DIAGNOSIS — M1990 Unspecified osteoarthritis, unspecified site: Secondary | ICD-10-CM | POA: Diagnosis not present

## 2017-02-26 DIAGNOSIS — E1140 Type 2 diabetes mellitus with diabetic neuropathy, unspecified: Secondary | ICD-10-CM | POA: Diagnosis not present

## 2017-02-26 DIAGNOSIS — M6281 Muscle weakness (generalized): Secondary | ICD-10-CM | POA: Diagnosis not present

## 2017-02-26 DIAGNOSIS — I340 Nonrheumatic mitral (valve) insufficiency: Secondary | ICD-10-CM | POA: Diagnosis not present

## 2017-02-26 DIAGNOSIS — I5032 Chronic diastolic (congestive) heart failure: Secondary | ICD-10-CM | POA: Diagnosis not present

## 2017-02-26 DIAGNOSIS — I110 Hypertensive heart disease with heart failure: Secondary | ICD-10-CM | POA: Diagnosis not present

## 2017-02-27 DIAGNOSIS — I340 Nonrheumatic mitral (valve) insufficiency: Secondary | ICD-10-CM | POA: Diagnosis not present

## 2017-02-27 DIAGNOSIS — M6281 Muscle weakness (generalized): Secondary | ICD-10-CM | POA: Diagnosis not present

## 2017-02-27 DIAGNOSIS — M1990 Unspecified osteoarthritis, unspecified site: Secondary | ICD-10-CM | POA: Diagnosis not present

## 2017-02-27 DIAGNOSIS — E1140 Type 2 diabetes mellitus with diabetic neuropathy, unspecified: Secondary | ICD-10-CM | POA: Diagnosis not present

## 2017-02-27 DIAGNOSIS — I110 Hypertensive heart disease with heart failure: Secondary | ICD-10-CM | POA: Diagnosis not present

## 2017-02-27 DIAGNOSIS — I5032 Chronic diastolic (congestive) heart failure: Secondary | ICD-10-CM | POA: Diagnosis not present

## 2017-02-28 DIAGNOSIS — E113513 Type 2 diabetes mellitus with proliferative diabetic retinopathy with macular edema, bilateral: Secondary | ICD-10-CM | POA: Diagnosis not present

## 2017-02-28 DIAGNOSIS — H353211 Exudative age-related macular degeneration, right eye, with active choroidal neovascularization: Secondary | ICD-10-CM | POA: Diagnosis not present

## 2017-03-01 DIAGNOSIS — I5032 Chronic diastolic (congestive) heart failure: Secondary | ICD-10-CM | POA: Diagnosis not present

## 2017-03-01 DIAGNOSIS — E113299 Type 2 diabetes mellitus with mild nonproliferative diabetic retinopathy without macular edema, unspecified eye: Secondary | ICD-10-CM | POA: Diagnosis not present

## 2017-03-01 DIAGNOSIS — M1990 Unspecified osteoarthritis, unspecified site: Secondary | ICD-10-CM | POA: Diagnosis not present

## 2017-03-01 DIAGNOSIS — I340 Nonrheumatic mitral (valve) insufficiency: Secondary | ICD-10-CM | POA: Diagnosis not present

## 2017-03-01 DIAGNOSIS — E1140 Type 2 diabetes mellitus with diabetic neuropathy, unspecified: Secondary | ICD-10-CM | POA: Diagnosis not present

## 2017-03-01 DIAGNOSIS — I10 Essential (primary) hypertension: Secondary | ICD-10-CM | POA: Diagnosis not present

## 2017-03-01 DIAGNOSIS — Z79899 Other long term (current) drug therapy: Secondary | ICD-10-CM | POA: Diagnosis not present

## 2017-03-01 DIAGNOSIS — D696 Thrombocytopenia, unspecified: Secondary | ICD-10-CM | POA: Diagnosis not present

## 2017-03-01 DIAGNOSIS — M6281 Muscle weakness (generalized): Secondary | ICD-10-CM | POA: Diagnosis not present

## 2017-03-01 DIAGNOSIS — I110 Hypertensive heart disease with heart failure: Secondary | ICD-10-CM | POA: Diagnosis not present

## 2017-03-02 DIAGNOSIS — I340 Nonrheumatic mitral (valve) insufficiency: Secondary | ICD-10-CM | POA: Diagnosis not present

## 2017-03-02 DIAGNOSIS — M1990 Unspecified osteoarthritis, unspecified site: Secondary | ICD-10-CM | POA: Diagnosis not present

## 2017-03-02 DIAGNOSIS — E1140 Type 2 diabetes mellitus with diabetic neuropathy, unspecified: Secondary | ICD-10-CM | POA: Diagnosis not present

## 2017-03-02 DIAGNOSIS — M6281 Muscle weakness (generalized): Secondary | ICD-10-CM | POA: Diagnosis not present

## 2017-03-02 DIAGNOSIS — I110 Hypertensive heart disease with heart failure: Secondary | ICD-10-CM | POA: Diagnosis not present

## 2017-03-02 DIAGNOSIS — I5032 Chronic diastolic (congestive) heart failure: Secondary | ICD-10-CM | POA: Diagnosis not present

## 2017-03-05 DIAGNOSIS — M1990 Unspecified osteoarthritis, unspecified site: Secondary | ICD-10-CM | POA: Diagnosis not present

## 2017-03-05 DIAGNOSIS — I5032 Chronic diastolic (congestive) heart failure: Secondary | ICD-10-CM | POA: Diagnosis not present

## 2017-03-05 DIAGNOSIS — I110 Hypertensive heart disease with heart failure: Secondary | ICD-10-CM | POA: Diagnosis not present

## 2017-03-05 DIAGNOSIS — E1140 Type 2 diabetes mellitus with diabetic neuropathy, unspecified: Secondary | ICD-10-CM | POA: Diagnosis not present

## 2017-03-05 DIAGNOSIS — M6281 Muscle weakness (generalized): Secondary | ICD-10-CM | POA: Diagnosis not present

## 2017-03-05 DIAGNOSIS — I340 Nonrheumatic mitral (valve) insufficiency: Secondary | ICD-10-CM | POA: Diagnosis not present

## 2017-03-06 DIAGNOSIS — M1990 Unspecified osteoarthritis, unspecified site: Secondary | ICD-10-CM | POA: Diagnosis not present

## 2017-03-06 DIAGNOSIS — I340 Nonrheumatic mitral (valve) insufficiency: Secondary | ICD-10-CM | POA: Diagnosis not present

## 2017-03-06 DIAGNOSIS — I5032 Chronic diastolic (congestive) heart failure: Secondary | ICD-10-CM | POA: Diagnosis not present

## 2017-03-06 DIAGNOSIS — E1140 Type 2 diabetes mellitus with diabetic neuropathy, unspecified: Secondary | ICD-10-CM | POA: Diagnosis not present

## 2017-03-06 DIAGNOSIS — M6281 Muscle weakness (generalized): Secondary | ICD-10-CM | POA: Diagnosis not present

## 2017-03-06 DIAGNOSIS — I110 Hypertensive heart disease with heart failure: Secondary | ICD-10-CM | POA: Diagnosis not present

## 2017-03-07 DIAGNOSIS — M6281 Muscle weakness (generalized): Secondary | ICD-10-CM | POA: Diagnosis not present

## 2017-03-07 DIAGNOSIS — I110 Hypertensive heart disease with heart failure: Secondary | ICD-10-CM | POA: Diagnosis not present

## 2017-03-07 DIAGNOSIS — I5032 Chronic diastolic (congestive) heart failure: Secondary | ICD-10-CM | POA: Diagnosis not present

## 2017-03-07 DIAGNOSIS — I340 Nonrheumatic mitral (valve) insufficiency: Secondary | ICD-10-CM | POA: Diagnosis not present

## 2017-03-07 DIAGNOSIS — E1140 Type 2 diabetes mellitus with diabetic neuropathy, unspecified: Secondary | ICD-10-CM | POA: Diagnosis not present

## 2017-03-07 DIAGNOSIS — M1990 Unspecified osteoarthritis, unspecified site: Secondary | ICD-10-CM | POA: Diagnosis not present

## 2017-03-08 DIAGNOSIS — E1140 Type 2 diabetes mellitus with diabetic neuropathy, unspecified: Secondary | ICD-10-CM | POA: Diagnosis not present

## 2017-03-08 DIAGNOSIS — I340 Nonrheumatic mitral (valve) insufficiency: Secondary | ICD-10-CM | POA: Diagnosis not present

## 2017-03-08 DIAGNOSIS — M6281 Muscle weakness (generalized): Secondary | ICD-10-CM | POA: Diagnosis not present

## 2017-03-08 DIAGNOSIS — I5032 Chronic diastolic (congestive) heart failure: Secondary | ICD-10-CM | POA: Diagnosis not present

## 2017-03-08 DIAGNOSIS — M1990 Unspecified osteoarthritis, unspecified site: Secondary | ICD-10-CM | POA: Diagnosis not present

## 2017-03-08 DIAGNOSIS — I110 Hypertensive heart disease with heart failure: Secondary | ICD-10-CM | POA: Diagnosis not present

## 2017-03-09 DIAGNOSIS — M6281 Muscle weakness (generalized): Secondary | ICD-10-CM | POA: Diagnosis not present

## 2017-03-09 DIAGNOSIS — I5032 Chronic diastolic (congestive) heart failure: Secondary | ICD-10-CM | POA: Diagnosis not present

## 2017-03-09 DIAGNOSIS — M1990 Unspecified osteoarthritis, unspecified site: Secondary | ICD-10-CM | POA: Diagnosis not present

## 2017-03-09 DIAGNOSIS — I340 Nonrheumatic mitral (valve) insufficiency: Secondary | ICD-10-CM | POA: Diagnosis not present

## 2017-03-09 DIAGNOSIS — E1140 Type 2 diabetes mellitus with diabetic neuropathy, unspecified: Secondary | ICD-10-CM | POA: Diagnosis not present

## 2017-03-09 DIAGNOSIS — I110 Hypertensive heart disease with heart failure: Secondary | ICD-10-CM | POA: Diagnosis not present

## 2017-03-12 DIAGNOSIS — M6281 Muscle weakness (generalized): Secondary | ICD-10-CM | POA: Diagnosis not present

## 2017-03-12 DIAGNOSIS — M1990 Unspecified osteoarthritis, unspecified site: Secondary | ICD-10-CM | POA: Diagnosis not present

## 2017-03-12 DIAGNOSIS — I110 Hypertensive heart disease with heart failure: Secondary | ICD-10-CM | POA: Diagnosis not present

## 2017-03-12 DIAGNOSIS — I5032 Chronic diastolic (congestive) heart failure: Secondary | ICD-10-CM | POA: Diagnosis not present

## 2017-03-12 DIAGNOSIS — I340 Nonrheumatic mitral (valve) insufficiency: Secondary | ICD-10-CM | POA: Diagnosis not present

## 2017-03-12 DIAGNOSIS — E1140 Type 2 diabetes mellitus with diabetic neuropathy, unspecified: Secondary | ICD-10-CM | POA: Diagnosis not present

## 2017-03-13 DIAGNOSIS — E1140 Type 2 diabetes mellitus with diabetic neuropathy, unspecified: Secondary | ICD-10-CM | POA: Diagnosis not present

## 2017-03-13 DIAGNOSIS — I340 Nonrheumatic mitral (valve) insufficiency: Secondary | ICD-10-CM | POA: Diagnosis not present

## 2017-03-13 DIAGNOSIS — I110 Hypertensive heart disease with heart failure: Secondary | ICD-10-CM | POA: Diagnosis not present

## 2017-03-13 DIAGNOSIS — M1990 Unspecified osteoarthritis, unspecified site: Secondary | ICD-10-CM | POA: Diagnosis not present

## 2017-03-13 DIAGNOSIS — I5032 Chronic diastolic (congestive) heart failure: Secondary | ICD-10-CM | POA: Diagnosis not present

## 2017-03-13 DIAGNOSIS — M6281 Muscle weakness (generalized): Secondary | ICD-10-CM | POA: Diagnosis not present

## 2017-03-14 DIAGNOSIS — I110 Hypertensive heart disease with heart failure: Secondary | ICD-10-CM | POA: Diagnosis not present

## 2017-03-14 DIAGNOSIS — E1140 Type 2 diabetes mellitus with diabetic neuropathy, unspecified: Secondary | ICD-10-CM | POA: Diagnosis not present

## 2017-03-14 DIAGNOSIS — I340 Nonrheumatic mitral (valve) insufficiency: Secondary | ICD-10-CM | POA: Diagnosis not present

## 2017-03-14 DIAGNOSIS — M6281 Muscle weakness (generalized): Secondary | ICD-10-CM | POA: Diagnosis not present

## 2017-03-14 DIAGNOSIS — M1990 Unspecified osteoarthritis, unspecified site: Secondary | ICD-10-CM | POA: Diagnosis not present

## 2017-03-14 DIAGNOSIS — I5032 Chronic diastolic (congestive) heart failure: Secondary | ICD-10-CM | POA: Diagnosis not present

## 2017-03-15 DIAGNOSIS — E1140 Type 2 diabetes mellitus with diabetic neuropathy, unspecified: Secondary | ICD-10-CM | POA: Diagnosis not present

## 2017-03-15 DIAGNOSIS — I110 Hypertensive heart disease with heart failure: Secondary | ICD-10-CM | POA: Diagnosis not present

## 2017-03-15 DIAGNOSIS — M6281 Muscle weakness (generalized): Secondary | ICD-10-CM | POA: Diagnosis not present

## 2017-03-15 DIAGNOSIS — I340 Nonrheumatic mitral (valve) insufficiency: Secondary | ICD-10-CM | POA: Diagnosis not present

## 2017-03-15 DIAGNOSIS — M1990 Unspecified osteoarthritis, unspecified site: Secondary | ICD-10-CM | POA: Diagnosis not present

## 2017-03-15 DIAGNOSIS — I5032 Chronic diastolic (congestive) heart failure: Secondary | ICD-10-CM | POA: Diagnosis not present

## 2017-03-19 DIAGNOSIS — E1140 Type 2 diabetes mellitus with diabetic neuropathy, unspecified: Secondary | ICD-10-CM | POA: Diagnosis not present

## 2017-03-19 DIAGNOSIS — I340 Nonrheumatic mitral (valve) insufficiency: Secondary | ICD-10-CM | POA: Diagnosis not present

## 2017-03-19 DIAGNOSIS — M6281 Muscle weakness (generalized): Secondary | ICD-10-CM | POA: Diagnosis not present

## 2017-03-19 DIAGNOSIS — I110 Hypertensive heart disease with heart failure: Secondary | ICD-10-CM | POA: Diagnosis not present

## 2017-03-19 DIAGNOSIS — I10 Essential (primary) hypertension: Secondary | ICD-10-CM | POA: Diagnosis not present

## 2017-03-19 DIAGNOSIS — M1990 Unspecified osteoarthritis, unspecified site: Secondary | ICD-10-CM | POA: Diagnosis not present

## 2017-03-19 DIAGNOSIS — I5032 Chronic diastolic (congestive) heart failure: Secondary | ICD-10-CM | POA: Diagnosis not present

## 2017-03-20 DIAGNOSIS — M1990 Unspecified osteoarthritis, unspecified site: Secondary | ICD-10-CM | POA: Diagnosis not present

## 2017-03-20 DIAGNOSIS — I5032 Chronic diastolic (congestive) heart failure: Secondary | ICD-10-CM | POA: Diagnosis not present

## 2017-03-20 DIAGNOSIS — E1140 Type 2 diabetes mellitus with diabetic neuropathy, unspecified: Secondary | ICD-10-CM | POA: Diagnosis not present

## 2017-03-20 DIAGNOSIS — M6281 Muscle weakness (generalized): Secondary | ICD-10-CM | POA: Diagnosis not present

## 2017-03-20 DIAGNOSIS — I110 Hypertensive heart disease with heart failure: Secondary | ICD-10-CM | POA: Diagnosis not present

## 2017-03-20 DIAGNOSIS — I340 Nonrheumatic mitral (valve) insufficiency: Secondary | ICD-10-CM | POA: Diagnosis not present

## 2017-03-21 DIAGNOSIS — I110 Hypertensive heart disease with heart failure: Secondary | ICD-10-CM | POA: Diagnosis not present

## 2017-03-21 DIAGNOSIS — M6281 Muscle weakness (generalized): Secondary | ICD-10-CM | POA: Diagnosis not present

## 2017-03-21 DIAGNOSIS — I5032 Chronic diastolic (congestive) heart failure: Secondary | ICD-10-CM | POA: Diagnosis not present

## 2017-03-21 DIAGNOSIS — I340 Nonrheumatic mitral (valve) insufficiency: Secondary | ICD-10-CM | POA: Diagnosis not present

## 2017-03-21 DIAGNOSIS — M1990 Unspecified osteoarthritis, unspecified site: Secondary | ICD-10-CM | POA: Diagnosis not present

## 2017-03-21 DIAGNOSIS — E1140 Type 2 diabetes mellitus with diabetic neuropathy, unspecified: Secondary | ICD-10-CM | POA: Diagnosis not present

## 2017-03-22 ENCOUNTER — Telehealth: Payer: Self-pay | Admitting: Sports Medicine

## 2017-03-22 DIAGNOSIS — I340 Nonrheumatic mitral (valve) insufficiency: Secondary | ICD-10-CM | POA: Diagnosis not present

## 2017-03-22 DIAGNOSIS — I5032 Chronic diastolic (congestive) heart failure: Secondary | ICD-10-CM | POA: Diagnosis not present

## 2017-03-22 DIAGNOSIS — E1140 Type 2 diabetes mellitus with diabetic neuropathy, unspecified: Secondary | ICD-10-CM | POA: Diagnosis not present

## 2017-03-22 DIAGNOSIS — I110 Hypertensive heart disease with heart failure: Secondary | ICD-10-CM | POA: Diagnosis not present

## 2017-03-22 DIAGNOSIS — M6281 Muscle weakness (generalized): Secondary | ICD-10-CM | POA: Diagnosis not present

## 2017-03-22 DIAGNOSIS — M1990 Unspecified osteoarthritis, unspecified site: Secondary | ICD-10-CM | POA: Diagnosis not present

## 2017-03-22 NOTE — Telephone Encounter (Signed)
Shanon Brow with [facility] called wanting to extend pt's home therapy for twice a week for two (2) more weeks to help progress her gait, mobility, and getting in and out of the shower for bathing. Can reach Shanon Brow at [phone].

## 2017-03-22 NOTE — Telephone Encounter (Signed)
Left message stating Dr. Marcene Duos the continuation of home PT as described.

## 2017-03-23 ENCOUNTER — Emergency Department (HOSPITAL_BASED_OUTPATIENT_CLINIC_OR_DEPARTMENT_OTHER)
Admission: EM | Admit: 2017-03-23 | Discharge: 2017-03-23 | Disposition: A | Discharge status: Home / Self Care | Payer: Medicare Other | Attending: Emergency Medicine | Admitting: Emergency Medicine

## 2017-03-23 ENCOUNTER — Emergency Department (HOSPITAL_BASED_OUTPATIENT_CLINIC_OR_DEPARTMENT_OTHER): Payer: Medicare Other

## 2017-03-23 ENCOUNTER — Encounter (HOSPITAL_BASED_OUTPATIENT_CLINIC_OR_DEPARTMENT_OTHER): Payer: Self-pay | Admitting: Emergency Medicine

## 2017-03-23 DIAGNOSIS — Z79899 Other long term (current) drug therapy: Secondary | ICD-10-CM | POA: Insufficient documentation

## 2017-03-23 DIAGNOSIS — E119 Type 2 diabetes mellitus without complications: Secondary | ICD-10-CM | POA: Insufficient documentation

## 2017-03-23 DIAGNOSIS — R600 Localized edema: Secondary | ICD-10-CM | POA: Diagnosis not present

## 2017-03-23 DIAGNOSIS — I110 Hypertensive heart disease with heart failure: Secondary | ICD-10-CM | POA: Insufficient documentation

## 2017-03-23 DIAGNOSIS — M25572 Pain in left ankle and joints of left foot: Secondary | ICD-10-CM

## 2017-03-23 DIAGNOSIS — E1140 Type 2 diabetes mellitus with diabetic neuropathy, unspecified: Secondary | ICD-10-CM | POA: Diagnosis not present

## 2017-03-23 DIAGNOSIS — M6281 Muscle weakness (generalized): Secondary | ICD-10-CM | POA: Diagnosis not present

## 2017-03-23 DIAGNOSIS — I340 Nonrheumatic mitral (valve) insufficiency: Secondary | ICD-10-CM | POA: Diagnosis not present

## 2017-03-23 DIAGNOSIS — M7989 Other specified soft tissue disorders: Secondary | ICD-10-CM | POA: Diagnosis not present

## 2017-03-23 DIAGNOSIS — I5032 Chronic diastolic (congestive) heart failure: Secondary | ICD-10-CM | POA: Insufficient documentation

## 2017-03-23 DIAGNOSIS — M19072 Primary osteoarthritis, left ankle and foot: Secondary | ICD-10-CM | POA: Diagnosis not present

## 2017-03-23 DIAGNOSIS — R7989 Other specified abnormal findings of blood chemistry: Secondary | ICD-10-CM | POA: Diagnosis not present

## 2017-03-23 DIAGNOSIS — M79662 Pain in left lower leg: Secondary | ICD-10-CM | POA: Diagnosis present

## 2017-03-23 DIAGNOSIS — M1990 Unspecified osteoarthritis, unspecified site: Secondary | ICD-10-CM | POA: Diagnosis not present

## 2017-03-23 LAB — BASIC METABOLIC PANEL
Anion gap: 10 (ref 5–15)
BUN: 24 mg/dL — AB (ref 6–20)
CHLORIDE: 98 mmol/L — AB (ref 101–111)
CO2: 32 mmol/L (ref 22–32)
CREATININE: 1.04 mg/dL — AB (ref 0.44–1.00)
Calcium: 9.7 mg/dL (ref 8.9–10.3)
GFR calc non Af Amer: 45 mL/min — ABNORMAL LOW (ref >60)
GFR, EST AFRICAN AMERICAN: 52 mL/min — AB (ref >60)
Glucose, Bld: 179 mg/dL — ABNORMAL HIGH (ref 65–99)
Potassium: 3.8 mmol/L (ref 3.5–5.1)
Sodium: 140 mmol/L (ref 135–145)

## 2017-03-23 LAB — CBC WITH DIFFERENTIAL/PLATELET
Basophils Absolute: 0 10*3/uL (ref 0.0–0.1)
Basophils Relative: 0 %
EOS ABS: 0.1 10*3/uL (ref 0.0–0.7)
Eosinophils Relative: 1 %
HCT: 47.2 % — ABNORMAL HIGH (ref 36.0–46.0)
HEMOGLOBIN: 15.2 g/dL — AB (ref 12.0–15.0)
Lymphocytes Relative: 15 %
Lymphs Abs: 1.2 10*3/uL (ref 0.7–4.0)
MCH: 29.8 pg (ref 26.0–34.0)
MCHC: 32.2 g/dL (ref 30.0–36.0)
MCV: 92.5 fL (ref 78.0–100.0)
MONOS PCT: 13 %
Monocytes Absolute: 1.1 10*3/uL — ABNORMAL HIGH (ref 0.1–1.0)
NEUTROS PCT: 71 %
Neutro Abs: 6 10*3/uL (ref 1.7–7.7)
Platelets: 145 10*3/uL — ABNORMAL LOW (ref 150–400)
RBC: 5.1 MIL/uL (ref 3.87–5.11)
RDW: 14.4 % (ref 11.5–15.5)
WBC: 8.4 10*3/uL (ref 4.0–10.5)

## 2017-03-23 LAB — D-DIMER, QUANTITATIVE (NOT AT ARMC): D DIMER QUANT: 1.75 ug{FEU}/mL — AB (ref 0.00–0.50)

## 2017-03-23 LAB — MAGNESIUM: Magnesium: 2 mg/dL (ref 1.7–2.4)

## 2017-03-23 MED ORDER — TRAMADOL HCL 50 MG PO TABS: 50.0000 mg | ORAL_TABLET | Freq: Four times a day (QID) | ORAL | 0 refills | Status: DC | PRN

## 2017-03-23 MED ORDER — ACETAMINOPHEN 325 MG PO TABS
650.0000 mg | ORAL_TABLET | Freq: Once | ORAL | Status: AC
  Administered 2017-03-23: 650 mg via ORAL
  Filled 2017-03-23: qty 2

## 2017-03-23 NOTE — ED Notes (Signed)
Pt taken to radiology

## 2017-03-23 NOTE — ED Triage Notes (Signed)
Patient states that she is having left lower leg pain, saw her DR at the beginning of the week and they increased her "fluid" pill  - patient states that she is now having pain to hr foot and ankle when she ambulates and her son feels like it needs to be rechecked

## 2017-03-23 NOTE — ED Notes (Signed)
Pt on monitor 

## 2017-03-23 NOTE — ED Provider Notes (Signed)
[facility]PT [facility] Provider Note   CSN: [id number] Arrival date & time: 03/23/17  1619     History   Chief Complaint Chief Complaint  Patient presents with  . Leg Pain    HPI Heidi Dominguez is a 83 y.o. female.  Pt presents to the ED today with leg pain.  The pt has been having left ankle pain for the past few days.  Pt did go to her pcp who thought it was from peripheral edema, so increased her lasix.  The pt's son said she is unable to put any weight on her left ankle and wanted to make sure it was not broken.      Past Medical History:  Diagnosis Date  . Chronic diastolic CHF (congestive heart failure) ([place])   . Complication of anesthesia    I have a hard time waking up"  . Degenerative joint disease of shoulder region   . Diabetes mellitus   . GERD (gastroesophageal reflux disease)   . High cholesterol   . HOH (hard of hearing)   . Hypertension   . Osteoarthritis   . Pneumonia     Patient Active Problem List   Diagnosis Date Noted  . Choledocholithiasis with chronic cholecystitis, nonobstructing 04/11/2015  . Pre-operative cardiovascular examination, high risk surgery   . Epigastric abdominal pain 04/08/2015  . Acute gallstone pancreatitis s/p lap chole w [facility] 04/11/2015 04/08/2015  . Diabetes mellitus type 2, controlled ([place]) 04/08/2015  . Gallstone pancreatitis 04/08/2015  . Mitral regurgitation 04/08/2015  . Chronic diastolic CHF (congestive heart failure) ([place]) 04/08/2015  . Dyspnea 03/31/2015  . Ischemic colitis ([place]) 08/09/2012  . Colitis 08/07/2012  . Rectal bleeding 08/07/2012  . Weakness 09/24/2011  . Bradycardia 09/24/2011  . UTI (lower urinary tract infection) 09/24/2011  . Hematochezia 09/24/2011  . DM (diabetes mellitus) ([place]) 09/24/2011  . Essential hypertension 09/24/2011  . Dyslipidemia 09/24/2011    Past Surgical History:  Procedure Laterality Date  . ABDOMINAL HYSTERECTOMY    . BACK SURGERY    . cataracts     bilateral  .  KNEE SURGERY    . LAPAROSCOPIC CHOLECYSTECTOMY SINGLE SITE WITH INTRAOPERATIVE CHOLANGIOGRAM N/A 04/11/2015   Procedure: LAPAROSCOPIC LYSIS OF ADHESIONS, LAPAROSCOPIC CHOLECYSTECTOMY WITH INTRAOPERATIVE CHOLANGIOGRAM;  Surgeon: Michael Boston, MD;  Location: [facility];  Service: General;  Laterality: N/A;  . SHOULDER SURGERY      OB History    No data available       Home Medications    Prior to Admission medications   Medication Sig Start Date End Date Taking? Authorizing Provider  acetaminophen (TYLENOL) 325 MG tablet Take 2 tablets (650 mg total) by mouth every 6 (six) hours as needed (or Fever >/= 101). 08/12/12   Nita Sells, MD  albuterol (PROVENTIL HFA;VENTOLIN HFA) 108 (90 Base) MCG/ACT inhaler Inhale 2 puffs into the lungs every 4 (four) hours as needed for wheezing or shortness of breath.    [provider]  amLODipine (NORVASC) 10 MG tablet Take 1 tablet (10 mg total) by mouth daily. 04/13/15   Hongalgi, Lenis Dickinson, MD  aspirin 81 MG tablet Take 81 mg by mouth daily.    [provider]  budesonide-formoterol (SYMBICORT) 160-4.5 MCG/ACT inhaler Inhale 2 puffs into the lungs 2 (two) times daily. 04/13/15   Hongalgi, Lenis Dickinson, MD  Cholecalciferol (VITAMIN D3) 5000 UNITS CAPS Take 1 capsule by mouth daily.    [provider]  Ferrous Gluconate (IRON) 240 (27 FE) MG TABS Take 1  tablet by mouth daily.    [provider]  furosemide (LASIX) 20 MG tablet 1 TABLET (20 MG) BY MOUTH DAILY 03/22/15   [provider]  JANUVIA 50 MG tablet 1 TABLET (50 MG) BY MOUTH DAILY 03/11/15   [provider]  levalbuterol (XOPENEX) 1.25 MG/3ML nebulizer solution Take 1.25 mg by nebulization every 4 (four) hours as needed for wheezing.    [provider]  Magnesium 250 MG TABS Take 250 mg by mouth daily.     [provider]  mirabegron ER (MYRBETRIQ) 50 MG TB24 tablet Take 50 mg by mouth daily.    [provider]  nitrofurantoin,  macrocrystal-monohydrate, (MACROBID) 100 MG capsule Take 1 capsule (100 mg total) by mouth 2 (two) times daily. 05/12/16   Tanna Furry, MD  ondansetron (ZOFRAN ODT) 4 MG disintegrating tablet Take 1 tablet (4 mg total) by mouth every 8 (eight) hours as needed for nausea. 05/12/16   Tanna Furry, MD  potassium chloride (K-DUR,KLOR-CON) 10 MEQ tablet Take 10 mEq by mouth daily.  03/22/15   [provider]  pravastatin (PRAVACHOL) 40 MG tablet Take 40 mg by mouth daily.    [provider]  Protein (PROCEL) POWD Take 1 scoop by mouth 2 (two) times daily.     [provider]  ranitidine (ZANTAC) 150 MG tablet Take 1 tablet (150 mg total) by mouth 2 (two) times daily. 02/29/12 04/07/16  Dixie Dials, MD  traMADol (ULTRAM) 50 MG tablet Take 1 tablet (50 mg total) by mouth every 6 (six) hours as needed for moderate pain or severe pain. 03/23/17   Isla Pence, MD  VENTOLIN HFA 108 (90 BASE) MCG/ACT inhaler INHALE 2 PUFFS Q 4 H PRN 02/22/15   [provider]  vitamin B-12 (CYANOCOBALAMIN) 500 MCG tablet Take 500 mcg by mouth daily.    [provider]    Family History Family History  Problem Relation Age of Onset  . Hypertension Mother   . Diabetes Mellitus I Mother   . Hypertension Father     Social History Social History  Substance Use Topics  . Smoking status: Never Smoker  . Smokeless tobacco: Never Used  . Alcohol use No     Allergies   Catapres [clonidine hcl]; Covera-hs [verapamil hcl]; Other; Sulfa antibiotics; Sulfamethoxazole; Verapamil; and Sulfasalazine   Review of Systems Review of Systems  Musculoskeletal:       Left ankle pain  All other systems reviewed and are negative.    Physical Exam Updated Vital Signs BP 138/77 (BP Location: Right Arm)   Pulse 86   Temp 98 F (36.7 C) (Oral)   Resp 20   Ht 5\' 4"  (1.626 m)   Wt 87.1 kg (192 lb)   SpO2 100%   BMI 32.96 kg/m   Physical Exam  Constitutional: She is oriented to  person, place, and time. She appears well-developed and well-nourished.  HENT:  Head: Normocephalic and atraumatic.  Right Ear: External ear normal.  Left Ear: External ear normal.  Nose: Nose normal.  Mouth/Throat: Oropharynx is clear and moist.  Eyes: Pupils are equal, round, and reactive to light. Conjunctivae and EOM are normal.  Neck: Normal range of motion. Neck supple.  Cardiovascular: Normal rate, regular rhythm, normal heart sounds and intact distal pulses.   Pulmonary/Chest: Effort normal and breath sounds normal.  Abdominal: Soft. Bowel sounds are normal.  Musculoskeletal:       Left ankle: She exhibits swelling.  Mild bilateral calf tenderness.  Neurological: She is alert and oriented to person, place, and time.  Skin: Skin is warm and dry.  Psychiatric: She has a normal mood and affect. Her behavior is normal. Judgment and thought content normal.  Nursing note and vitals reviewed.    ED Treatments / Results  Labs (all labs ordered are listed, but only abnormal results are displayed) Labs Reviewed  BASIC METABOLIC PANEL - Abnormal; Notable for the following:       Result Value   Chloride 98 (*)    Glucose, Bld 179 (*)    BUN 24 (*)    Creatinine, Ser 1.04 (*)    GFR calc non Af Amer 45 (*)    GFR calc Af Amer 52 (*)    All other components within normal limits  CBC WITH DIFFERENTIAL/PLATELET - Abnormal; Notable for the following:    Hemoglobin 15.2 (*)    HCT 47.2 (*)    Platelets 145 (*)    Monocytes Absolute 1.1 (*)    All other components within normal limits  D-DIMER, QUANTITATIVE (NOT AT [facility]) - Abnormal; Notable for the following:    D-Dimer, Quant 1.75 (*)    All other components within normal limits  MAGNESIUM    EKG  EKG Interpretation None       Radiology Dg Ankle Complete Left  Result Date: 03/23/2017 CLINICAL DATA:  Left ankle swelling.  No known injury. EXAM: LEFT ANKLE COMPLETE - 3+ VIEW COMPARISON:  None. FINDINGS: Diffuse soft  tissue swelling. Diffuse osteopenia. Mild inferior calcaneal spur formation and mild dorsal tarsal spur formation. No fracture, dislocation or effusion seen. Atheromatous arterial calcification. IMPRESSION: Diffuse soft tissue swelling without acute underlying bony abnormality Electronically Signed   By: Claudie Revering M.D.   On: 03/23/2017 19:32   US Venous Img Lower Bilateral  Result Date: 03/23/2017 CLINICAL DATA:  Bilateral lower extremity edema and pain. Elevated D-dimer. EXAM: BILATERAL LOWER EXTREMITY VENOUS DOPPLER ULTRASOUND TECHNIQUE: Gray-scale sonography with graded compression, as well as color Doppler and duplex ultrasound were performed to evaluate the lower extremity deep venous systems from the level of the common femoral vein and including the common femoral, femoral, profunda femoral, popliteal and calf veins including the posterior tibial, peroneal and gastrocnemius veins when visible. The superficial great saphenous vein was also interrogated. Spectral Doppler was utilized to evaluate flow at rest and with distal augmentation maneuvers in the common femoral, femoral and popliteal veins. COMPARISON:  None. FINDINGS: RIGHT LOWER EXTREMITY Common Femoral Vein: No evidence of thrombus. Normal compressibility, respiratory phasicity and response to augmentation. Saphenofemoral Junction: No evidence of thrombus. Normal compressibility and flow on color Doppler imaging. Profunda Femoral Vein: No evidence of thrombus. Normal compressibility and flow on color Doppler imaging. Femoral Vein: No evidence of thrombus. Normal compressibility, respiratory phasicity and response to augmentation. Distal thigh segment was difficult to visualize well by ultrasound. Popliteal Vein: No evidence of thrombus. Normal compressibility, respiratory phasicity and response to augmentation. Calf Veins: Very limited evaluation of calf veins. The posterior tibial vein is patent distally. Superficial Great Saphenous Vein: No  evidence of thrombus. Normal compressibility and flow on color Doppler imaging. Venous Reflux:  None. Other Findings: No evidence of superficial thrombophlebitis or abnormal fluid collection. LEFT LOWER EXTREMITY Common Femoral Vein: No evidence of thrombus. Normal compressibility, respiratory phasicity and response to augmentation. Saphenofemoral Junction: No evidence of thrombus. Normal compressibility and flow on color Doppler imaging. Profunda Femoral Vein: No evidence of thrombus. Normal compressibility and flow on color Doppler imaging.  Femoral Vein: No evidence of thrombus. Normal compressibility, respiratory phasicity and response to augmentation. Popliteal Vein: No evidence of thrombus. Normal compressibility, respiratory phasicity and response to augmentation. Calf Veins: Very limited assessment of calf veins. Superficial Great Saphenous Vein: No evidence of thrombus. Normal compressibility and flow on color Doppler imaging. Venous Reflux:  None. Other Findings: No evidence of superficial thrombophlebitis or abnormal fluid collection. IMPRESSION: No evidence of DVT within either lower extremity. Limited assessment of the distal thigh segment of the right femoral vein and bilateral calf veins. Electronically Signed   By: Aletta Edouard M.D.   On: 03/23/2017 20:09    Procedures Procedures (including critical care time)  Medications Ordered in ED Medications  acetaminophen (TYLENOL) tablet 650 mg (650 mg Oral Given 03/23/17 1812)     Initial Impression / Assessment and Plan / ED Course  I have reviewed the triage vital signs and the nursing notes.  Pertinent labs & imaging results that were available during my care of the patient were reviewed by me and considered in my medical decision making (see chart for details).    No DVT.  No fx.  Pt will be given a few tramadol pills to help with pain.  Pt encouraged to just take tylenol first.  She is told to continue with home PT.  Return if worse.   F/u with pcp.  Final Clinical Impressions(s) / ED Diagnoses   Final diagnoses:  Primary osteoarthritis of left ankle  Acute left ankle pain    New Prescriptions Current Discharge Medication List       Isla Pence, MD 03/23/17 2048

## 2017-03-23 NOTE — Discharge Instructions (Signed)
Continue home PT

## 2017-03-26 DIAGNOSIS — I5032 Chronic diastolic (congestive) heart failure: Secondary | ICD-10-CM | POA: Diagnosis not present

## 2017-03-26 DIAGNOSIS — I340 Nonrheumatic mitral (valve) insufficiency: Secondary | ICD-10-CM | POA: Diagnosis not present

## 2017-03-26 DIAGNOSIS — M1990 Unspecified osteoarthritis, unspecified site: Secondary | ICD-10-CM | POA: Diagnosis not present

## 2017-03-26 DIAGNOSIS — I110 Hypertensive heart disease with heart failure: Secondary | ICD-10-CM | POA: Diagnosis not present

## 2017-03-26 DIAGNOSIS — M6281 Muscle weakness (generalized): Secondary | ICD-10-CM | POA: Diagnosis not present

## 2017-03-26 DIAGNOSIS — E1140 Type 2 diabetes mellitus with diabetic neuropathy, unspecified: Secondary | ICD-10-CM | POA: Diagnosis not present

## 2017-03-27 DIAGNOSIS — M1990 Unspecified osteoarthritis, unspecified site: Secondary | ICD-10-CM | POA: Diagnosis not present

## 2017-03-27 DIAGNOSIS — I340 Nonrheumatic mitral (valve) insufficiency: Secondary | ICD-10-CM | POA: Diagnosis not present

## 2017-03-27 DIAGNOSIS — I5032 Chronic diastolic (congestive) heart failure: Secondary | ICD-10-CM | POA: Diagnosis not present

## 2017-03-27 DIAGNOSIS — E1140 Type 2 diabetes mellitus with diabetic neuropathy, unspecified: Secondary | ICD-10-CM | POA: Diagnosis not present

## 2017-03-27 DIAGNOSIS — I110 Hypertensive heart disease with heart failure: Secondary | ICD-10-CM | POA: Diagnosis not present

## 2017-03-27 DIAGNOSIS — M6281 Muscle weakness (generalized): Secondary | ICD-10-CM | POA: Diagnosis not present

## 2017-03-28 ENCOUNTER — Ambulatory Visit (INDEPENDENT_AMBULATORY_CARE_PROVIDER_SITE_OTHER): Payer: Medicare Other | Admitting: Sports Medicine

## 2017-03-28 DIAGNOSIS — E1140 Type 2 diabetes mellitus with diabetic neuropathy, unspecified: Secondary | ICD-10-CM | POA: Diagnosis not present

## 2017-03-28 DIAGNOSIS — E1151 Type 2 diabetes mellitus with diabetic peripheral angiopathy without gangrene: Secondary | ICD-10-CM

## 2017-03-28 DIAGNOSIS — M2040 Other hammer toe(s) (acquired), unspecified foot: Secondary | ICD-10-CM | POA: Diagnosis not present

## 2017-03-28 DIAGNOSIS — M2010 Hallux valgus (acquired), unspecified foot: Secondary | ICD-10-CM | POA: Diagnosis not present

## 2017-03-28 DIAGNOSIS — M1000 Idiopathic gout, unspecified site: Secondary | ICD-10-CM | POA: Diagnosis not present

## 2017-03-28 DIAGNOSIS — M79672 Pain in left foot: Secondary | ICD-10-CM | POA: Diagnosis not present

## 2017-03-28 DIAGNOSIS — M7750 Other enthesopathy of unspecified foot: Secondary | ICD-10-CM | POA: Diagnosis not present

## 2017-03-28 DIAGNOSIS — R2681 Unsteadiness on feet: Secondary | ICD-10-CM | POA: Diagnosis not present

## 2017-03-28 DIAGNOSIS — M79671 Pain in right foot: Secondary | ICD-10-CM

## 2017-03-28 MED ORDER — METHYLPREDNISOLONE 4 MG PO TBPK: ORAL_TABLET | ORAL | 0 refills | Status: DC

## 2017-03-28 NOTE — Progress Notes (Signed)
Subjective: Heidi Dominguez is a 83 y.o. female patient with history of diabetes who presents to office today complaining of pain and swelling, worse on the left greater than the right with color changes to the skin. Patient went to the emergency room on 03/23/2017 and had x-rays done which is confirmed areas of arthritis. Patient also had a venous duplex done which was negative for DVT. Since patient has seen her primary care doctor who has increased Lasix, which has not changed any swelling or pain to her left greater than right foot and ankle. Patient is assisted by daughter who states that physical therapist came on yesterday and felt like her left ankle was warm and swollen and encouraged her to have it checked. Thus, they came in today for appointment.  Patient Active Problem List   Diagnosis Date Noted  . Choledocholithiasis with chronic cholecystitis, nonobstructing 04/11/2015  . Pre-operative cardiovascular examination, high risk surgery   . Epigastric abdominal pain 04/08/2015  . Acute gallstone pancreatitis s/p lap chole w [facility] 04/11/2015 04/08/2015  . Diabetes mellitus type 2, controlled ([place]) 04/08/2015  . Gallstone pancreatitis 04/08/2015  . Mitral regurgitation 04/08/2015  . Chronic diastolic CHF (congestive heart failure) ([place]) 04/08/2015  . Dyspnea 03/31/2015  . Ischemic colitis ([place]) 08/09/2012  . Colitis 08/07/2012  . Rectal bleeding 08/07/2012  . Weakness 09/24/2011  . Bradycardia 09/24/2011  . UTI (lower urinary tract infection) 09/24/2011  . Hematochezia 09/24/2011  . DM (diabetes mellitus) ([place]) 09/24/2011  . Essential hypertension 09/24/2011  . Dyslipidemia 09/24/2011   Current Outpatient Prescriptions on File Prior to Visit  Medication Sig Dispense Refill  . acetaminophen (TYLENOL) 325 MG tablet Take 2 tablets (650 mg total) by mouth every 6 (six) hours as needed (or Fever >/= 101).    Marland Kitchen albuterol (PROVENTIL HFA;VENTOLIN HFA) 108 (90 Base) MCG/ACT inhaler Inhale 2  puffs into the lungs every 4 (four) hours as needed for wheezing or shortness of breath.    Marland Kitchen amLODipine (NORVASC) 10 MG tablet Take 1 tablet (10 mg total) by mouth daily.    Marland Kitchen aspirin 81 MG tablet Take 81 mg by mouth daily.    . budesonide-formoterol (SYMBICORT) 160-4.5 MCG/ACT inhaler Inhale 2 puffs into the lungs 2 (two) times daily.    . Cholecalciferol (VITAMIN D3) 5000 UNITS CAPS Take 1 capsule by mouth daily.    . Ferrous Gluconate (IRON) 240 (27 FE) MG TABS Take 1 tablet by mouth daily.    . furosemide (LASIX) 20 MG tablet 1 TABLET (20 MG) BY MOUTH DAILY  6  . JANUVIA 50 MG tablet 1 TABLET (50 MG) BY MOUTH DAILY  5  . levalbuterol (XOPENEX) 1.25 MG/3ML nebulizer solution Take 1.25 mg by nebulization every 4 (four) hours as needed for wheezing.    . Magnesium 250 MG TABS Take 250 mg by mouth daily.     . mirabegron ER (MYRBETRIQ) 50 MG TB24 tablet Take 50 mg by mouth daily.    . nitrofurantoin, macrocrystal-monohydrate, (MACROBID) 100 MG capsule Take 1 capsule (100 mg total) by mouth 2 (two) times daily. 10 capsule 0  . ondansetron (ZOFRAN ODT) 4 MG disintegrating tablet Take 1 tablet (4 mg total) by mouth every 8 (eight) hours as needed for nausea. 6 tablet 0  . potassium chloride (K-DUR,KLOR-CON) 10 MEQ tablet Take 10 mEq by mouth daily.     . pravastatin (PRAVACHOL) 40 MG tablet Take 40 mg by mouth daily.    . Protein (PROCEL) POWD Take 1 scoop by  mouth 2 (two) times daily.     . ranitidine (ZANTAC) 150 MG tablet Take 1 tablet (150 mg total) by mouth 2 (two) times daily. 60 tablet 1  . traMADol (ULTRAM) 50 MG tablet Take 1 tablet (50 mg total) by mouth every 6 (six) hours as needed for moderate pain or severe pain. 10 tablet 0  . VENTOLIN HFA 108 (90 BASE) MCG/ACT inhaler INHALE 2 PUFFS Q 4 H PRN  4  . vitamin B-12 (CYANOCOBALAMIN) 500 MCG tablet Take 500 mcg by mouth daily.     No current facility-administered medications on file prior to visit.    Allergies  Allergen Reactions  .  Catapres [Clonidine Hcl] Other (See Comments)    Unknown. Unknown.  Lorrin Goodell [Verapamil Hcl] Other (See Comments)    Unknown.  . Other Other (See Comments)    Any type of narcotics Feels "loopy" Any type of narcotics Feels "loopy"  . Sulfa Antibiotics Itching  . Sulfamethoxazole Diarrhea  . Verapamil Diarrhea and Other (See Comments)  . Sulfasalazine Itching    Recent Results (from the past 2160 hour(s))  Basic metabolic panel     Status: Abnormal   Collection Time: 03/23/17  6:04 PM  Result Value Ref Range   Sodium 140 135 - 145 mmol/L   Potassium 3.8 3.5 - 5.1 mmol/L   Chloride 98 (L) 101 - 111 mmol/L   CO2 32 22 - 32 mmol/L   Glucose, Bld 179 (H) 65 - 99 mg/dL   BUN 24 (H) 6 - 20 mg/dL   Creatinine, Ser 1.04 (H) 0.44 - 1.00 mg/dL   Calcium 9.7 8.9 - 10.3 mg/dL   GFR calc non Af Amer 45 (L) >60 mL/min   GFR calc Af Amer 52 (L) >60 mL/min    Comment: (NOTE) The eGFR has been calculated using the CKD EPI equation. This calculation has not been validated in all clinical situations. eGFR's persistently <60 mL/min signify possible Chronic Kidney Disease.    Anion gap 10 5 - 15  CBC with Differential     Status: Abnormal   Collection Time: 03/23/17  6:04 PM  Result Value Ref Range   WBC 8.4 4.0 - 10.5 K/uL   RBC 5.10 3.87 - 5.11 MIL/uL   Hemoglobin 15.2 (H) 12.0 - 15.0 g/dL   HCT 47.2 (H) 36.0 - 46.0 %   MCV 92.5 78.0 - 100.0 fL   MCH 29.8 26.0 - 34.0 pg   MCHC 32.2 30.0 - 36.0 g/dL   RDW 14.4 11.5 - 15.5 %   Platelets 145 (L) 150 - 400 K/uL   Neutrophils Relative % 71 %   Neutro Abs 6.0 1.7 - 7.7 K/uL   Lymphocytes Relative 15 %   Lymphs Abs 1.2 0.7 - 4.0 K/uL   Monocytes Relative 13 %   Monocytes Absolute 1.1 (H) 0.1 - 1.0 K/uL   Eosinophils Relative 1 %   Eosinophils Absolute 0.1 0.0 - 0.7 K/uL   Basophils Relative 0 %   Basophils Absolute 0.0 0.0 - 0.1 K/uL  D-dimer, quantitative (not at [facility])     Status: Abnormal   Collection Time: 03/23/17  6:04 PM   Result Value Ref Range   D-Dimer, Quant 1.75 (H) 0.00 - 0.50 ug/mL-FEU    Comment: (NOTE) At the manufacturer cut-off of 0.50 ug/mL FEU, this assay has been documented to exclude PE with a sensitivity and negative predictive value of 97 to 99%.  At this time, this assay has not been approved  by the FDA to exclude DVT/VTE. Results should be correlated with clinical presentation.   Magnesium     Status: None   Collection Time: 03/23/17  6:04 PM  Result Value Ref Range   Magnesium 2.0 1.7 - 2.4 mg/dL    Objective: General: Patient is awake, alert, and oriented x 3 and in no acute distress.  Integument: Skin is warm, dry and supple bilateral. Nails are Short, thickened and dystrophic with subungual debris, consistent with onychomycosis, 1-5 bilateral. No signs of infection. No open lesions. Mild reactive keratosis over left bunion. Remaining integument unremarkable.  Vasculature:  Dorsalis Pedis pulse 1/4 bilateral. Posterior Tibial pulse  0/4 bilateral. Capillary fill time <3 sec 1-5 bilateral. No hair growth to the level of the digits.Temperature gradient , mildly increased at left medial ankle. + varicosities present bilateral. 1+ pitting edema, left greater than right ankle.  Neurology: The patient has intact sensation measured with a 5.07/10g Semmes Weinstein Monofilament at all pedal sites bilateral . Vibratory sensation diminished bilateral with tuning fork. No Babinski sign present bilateral.   Musculoskeletal: + Bunion, hammertoes, pes planus symptomatic pedal deformities noted bilateral. Muscular strength 4/5 in all lower extremity muscular groups bilateral with pain to palpation diffusely all over both lower extremities to the level of knees bilateral. No tenderness with calf compression bilateral.  Gait rolling walker assisted with history of falls  Assessment and Plan: Problem List Items Addressed This Visit    None    Visit Diagnoses    Capsulitis of ankle, unspecified  laterality    -  Primary   L>R   Relevant Medications   methylPREDNISolone (MEDROL DOSEPAK) 4 MG TBPK tablet   Acute idiopathic gout, unspecified site       possible on left ankle   Relevant Medications   methylPREDNISolone (MEDROL DOSEPAK) 4 MG TBPK tablet   Type 2 diabetes, controlled, with neuropathy (HCC)       Hallux valgus, acquired, unspecified laterality       Hammer toe, unspecified laterality       Diabetic peripheral vascular disorder (HCC)       Foot pain, bilateral       Gait instability         -Examined patient -X-rays and Doppler from [facility] reviewed from 03/23/2017, which were negative for any acute findings -Discussed possibility of capsulitis with arthritis versus gout versus overuse with physical therapy versus partial tendon tear at left ankle, which is mostly involved -Prescribed Medrol Dosepak to take as instructed -Dispensed Surgi-tube compression sleeves to use bilateral to assist with edema reduction -Continue with lasix as given by primary care doctor -Recommend, rest, ice, elevation and to discontinue physical therapy and occupational therapy at this time, due to increased pain in feet and lower legs -Continue with rolling walker -Continue with [facility] home health aide to assist with daily activities of living -Patient advised to call the office if any problems or questions arise in the meantime. Patient to follow up in 2 weeks for re-evaluation. If there is no improvement in symptoms, we'll repeat x-rays and order arthritic panel to thoroughly check for gout or any other type of arthritic/rheumatologic condition that may be contributing to symptoms.  Landis Martins, DPM

## 2017-03-29 ENCOUNTER — Telehealth: Payer: Self-pay

## 2017-03-29 DIAGNOSIS — M1990 Unspecified osteoarthritis, unspecified site: Secondary | ICD-10-CM | POA: Diagnosis not present

## 2017-03-29 DIAGNOSIS — E1140 Type 2 diabetes mellitus with diabetic neuropathy, unspecified: Secondary | ICD-10-CM | POA: Diagnosis not present

## 2017-03-29 DIAGNOSIS — M6281 Muscle weakness (generalized): Secondary | ICD-10-CM | POA: Diagnosis not present

## 2017-03-29 DIAGNOSIS — I110 Hypertensive heart disease with heart failure: Secondary | ICD-10-CM | POA: Diagnosis not present

## 2017-03-29 DIAGNOSIS — I5032 Chronic diastolic (congestive) heart failure: Secondary | ICD-10-CM | POA: Diagnosis not present

## 2017-03-29 DIAGNOSIS — I340 Nonrheumatic mitral (valve) insufficiency: Secondary | ICD-10-CM | POA: Diagnosis not present

## 2017-03-29 NOTE — Telephone Encounter (Signed)
-----   Message from Landis Martins, [place] sent at 03/29/2017 11:59 AM EDT ----- Will you call patient or daughter and let them know that they can not receive the aide only. Continue with compression sleeve and D/C PT and OT. I am not sure if there are other agencies that we can go with for the aide only -Dr. Cannon Kettle ----- Message ----- From: Roney Jaffe, RN Sent: 03/29/2017  11:04 AM To: Landis Martins, DPM  Just spoke with the Medical sales representative at [facility]. I sent the orders over to hold PT and OT. They told me that the patient was only about a day or so away from getting discharged from PT and OT. She cannot receive [facility] aide only. She has to be seeing PT and OT to receive home health. They did notice that she was in the ED recently and currently wearing compression hose which was a new order. How would you like to proceed?

## 2017-03-29 NOTE — Telephone Encounter (Signed)
Verbal orders to [place] at [place] to d/c from PT and OT , Juliann Pulse advised that in order to receive [facility] services patient would need to hire private duty. Juliann Pulse said she would inform patient's daughter of this.

## 2017-03-29 NOTE — Telephone Encounter (Signed)
Sent fax to [facility] to hold PT and OT therapy but to continue home health aid

## 2017-03-29 NOTE — Telephone Encounter (Signed)
-----   Message from Landis Martins, [place] sent at 03/28/2017  5:35 PM EDT ----- Regarding: Heidi Dominguez PT/OT Please let patient's PT and OT therapists know to hold off on PT and OT at this time due to foot and ankle pain. They should still continue with her home health aid. Thanks Dr. Cannon Kettle

## 2017-03-29 NOTE — Telephone Encounter (Signed)
Just spoke with the Medical sales representative at [facility]. I sent the orders over to hold PT and OT. They told me that the patient was only about a day or so away from getting discharged from PT and OT. She cannot receive [facility] aide only. She has to be seeing PT and OT to receive home health. They did notice that she was in the ED recently and currently wearing compression hose which was a new order.

## 2017-03-30 DIAGNOSIS — M6281 Muscle weakness (generalized): Secondary | ICD-10-CM | POA: Diagnosis not present

## 2017-03-30 DIAGNOSIS — M1990 Unspecified osteoarthritis, unspecified site: Secondary | ICD-10-CM | POA: Diagnosis not present

## 2017-03-30 DIAGNOSIS — I5032 Chronic diastolic (congestive) heart failure: Secondary | ICD-10-CM | POA: Diagnosis not present

## 2017-03-30 DIAGNOSIS — I340 Nonrheumatic mitral (valve) insufficiency: Secondary | ICD-10-CM | POA: Diagnosis not present

## 2017-03-30 DIAGNOSIS — E1140 Type 2 diabetes mellitus with diabetic neuropathy, unspecified: Secondary | ICD-10-CM | POA: Diagnosis not present

## 2017-03-30 DIAGNOSIS — I110 Hypertensive heart disease with heart failure: Secondary | ICD-10-CM | POA: Diagnosis not present

## 2017-04-04 ENCOUNTER — Ambulatory Visit: Payer: Medicare Other | Admitting: Sports Medicine

## 2017-04-17 ENCOUNTER — Encounter: Payer: Self-pay | Admitting: Sports Medicine

## 2017-04-17 ENCOUNTER — Ambulatory Visit (INDEPENDENT_AMBULATORY_CARE_PROVIDER_SITE_OTHER): Payer: Medicare Other | Admitting: Sports Medicine

## 2017-04-17 DIAGNOSIS — B351 Tinea unguium: Secondary | ICD-10-CM

## 2017-04-17 DIAGNOSIS — M79671 Pain in right foot: Secondary | ICD-10-CM

## 2017-04-17 DIAGNOSIS — E1140 Type 2 diabetes mellitus with diabetic neuropathy, unspecified: Secondary | ICD-10-CM

## 2017-04-17 DIAGNOSIS — M2040 Other hammer toe(s) (acquired), unspecified foot: Secondary | ICD-10-CM

## 2017-04-17 DIAGNOSIS — E1151 Type 2 diabetes mellitus with diabetic peripheral angiopathy without gangrene: Secondary | ICD-10-CM | POA: Diagnosis not present

## 2017-04-17 DIAGNOSIS — M79672 Pain in left foot: Secondary | ICD-10-CM | POA: Diagnosis not present

## 2017-04-17 DIAGNOSIS — M2010 Hallux valgus (acquired), unspecified foot: Secondary | ICD-10-CM

## 2017-04-17 DIAGNOSIS — Q828 Other specified congenital malformations of skin: Secondary | ICD-10-CM | POA: Diagnosis not present

## 2017-04-17 NOTE — Patient Instructions (Signed)
Aspercreme to feet daily for pain  [place] Healthy Feet cream for callus/dry skin

## 2017-04-17 NOTE — Progress Notes (Signed)
Subjective: Heidi Dominguez is a 83 y.o. female patient with history of diabetes who presents to office today complaining of pain at callus at bunion and long, painful nails  while ambulating in shoes; unable to trim. Patient states that the glucose reading this morning was 171 mg/dl. Patient states that the steroid that I gave her helped with swelling but her legs are tender all the way up to her thighs. Admits she went for a massage that caused bruising.   Patient is assisted by home nurse who helps with her care.   Patient Active Problem List   Diagnosis Date Noted  . Choledocholithiasis with chronic cholecystitis, nonobstructing 04/11/2015  . Pre-operative cardiovascular examination, high risk surgery   . Epigastric abdominal pain 04/08/2015  . Acute gallstone pancreatitis s/p lap chole w [facility] 04/11/2015 04/08/2015  . Diabetes mellitus type 2, controlled ([place]) 04/08/2015  . Gallstone pancreatitis 04/08/2015  . Mitral regurgitation 04/08/2015  . Chronic diastolic CHF (congestive heart failure) ([place]) 04/08/2015  . Dyspnea 03/31/2015  . Ischemic colitis ([place]) 08/09/2012  . Colitis 08/07/2012  . Rectal bleeding 08/07/2012  . Weakness 09/24/2011  . Bradycardia 09/24/2011  . UTI (lower urinary tract infection) 09/24/2011  . Hematochezia 09/24/2011  . DM (diabetes mellitus) ([place]) 09/24/2011  . Essential hypertension 09/24/2011  . Dyslipidemia 09/24/2011   Current Outpatient Prescriptions on File Prior to Visit  Medication Sig Dispense Refill  . acetaminophen (TYLENOL) 325 MG tablet Take 2 tablets (650 mg total) by mouth every 6 (six) hours as needed (or Fever >/= 101).    Marland Kitchen albuterol (PROVENTIL HFA;VENTOLIN HFA) 108 (90 Base) MCG/ACT inhaler Inhale 2 puffs into the lungs every 4 (four) hours as needed for wheezing or shortness of breath.    Marland Kitchen amLODipine (NORVASC) 10 MG tablet Take 1 tablet (10 mg total) by mouth daily.    Marland Kitchen aspirin 81 MG tablet Take 81 mg by mouth daily.    .  budesonide-formoterol (SYMBICORT) 160-4.5 MCG/ACT inhaler Inhale 2 puffs into the lungs 2 (two) times daily.    . Cholecalciferol (VITAMIN D3) 5000 UNITS CAPS Take 1 capsule by mouth daily.    . Ferrous Gluconate (IRON) 240 (27 FE) MG TABS Take 1 tablet by mouth daily.    . furosemide (LASIX) 20 MG tablet 1 TABLET (20 MG) BY MOUTH DAILY  6  . JANUVIA 50 MG tablet 1 TABLET (50 MG) BY MOUTH DAILY  5  . levalbuterol (XOPENEX) 1.25 MG/3ML nebulizer solution Take 1.25 mg by nebulization every 4 (four) hours as needed for wheezing.    . Magnesium 250 MG TABS Take 250 mg by mouth daily.     . methylPREDNISolone (MEDROL DOSEPAK) 4 MG TBPK tablet Take as instructed 21 tablet 0  . mirabegron ER (MYRBETRIQ) 50 MG TB24 tablet Take 50 mg by mouth daily.    . nitrofurantoin, macrocrystal-monohydrate, (MACROBID) 100 MG capsule Take 1 capsule (100 mg total) by mouth 2 (two) times daily. 10 capsule 0  . ondansetron (ZOFRAN ODT) 4 MG disintegrating tablet Take 1 tablet (4 mg total) by mouth every 8 (eight) hours as needed for nausea. 6 tablet 0  . potassium chloride (K-DUR,KLOR-CON) 10 MEQ tablet Take 10 mEq by mouth daily.     . pravastatin (PRAVACHOL) 40 MG tablet Take 40 mg by mouth daily.    . Protein (PROCEL) POWD Take 1 scoop by mouth 2 (two) times daily.     . traMADol (ULTRAM) 50 MG tablet Take 1 tablet (50 mg total) by mouth  every 6 (six) hours as needed for moderate pain or severe pain. 10 tablet 0  . VENTOLIN HFA 108 (90 BASE) MCG/ACT inhaler INHALE 2 PUFFS Q 4 H PRN  4  . vitamin B-12 (CYANOCOBALAMIN) 500 MCG tablet Take 500 mcg by mouth daily.    . ranitidine (ZANTAC) 150 MG tablet Take 1 tablet (150 mg total) by mouth 2 (two) times daily. 60 tablet 1   No current facility-administered medications on file prior to visit.    Allergies  Allergen Reactions  . Catapres [Clonidine Hcl] Other (See Comments)    Unknown. Unknown.  Lorrin Goodell [Verapamil Hcl] Other (See Comments)    Unknown.  . Other  Other (See Comments)    Any type of narcotics Feels "loopy" Any type of narcotics Feels "loopy"  . Sulfa Antibiotics Itching  . Sulfamethoxazole Diarrhea  . Verapamil Diarrhea and Other (See Comments)  . Sulfasalazine Itching    Recent Results (from the past 2160 hour(s))  Basic metabolic panel     Status: Abnormal   Collection Time: 03/23/17  6:04 PM  Result Value Ref Range   Sodium 140 135 - 145 mmol/L   Potassium 3.8 3.5 - 5.1 mmol/L   Chloride 98 (L) 101 - 111 mmol/L   CO2 32 22 - 32 mmol/L   Glucose, Bld 179 (H) 65 - 99 mg/dL   BUN 24 (H) 6 - 20 mg/dL   Creatinine, Ser 1.04 (H) 0.44 - 1.00 mg/dL   Calcium 9.7 8.9 - 10.3 mg/dL   GFR calc non Af Amer 45 (L) >60 mL/min   GFR calc Af Amer 52 (L) >60 mL/min    Comment: (NOTE) The eGFR has been calculated using the CKD EPI equation. This calculation has not been validated in all clinical situations. eGFR's persistently <60 mL/min signify possible Chronic Kidney Disease.    Anion gap 10 5 - 15  CBC with Differential     Status: Abnormal   Collection Time: 03/23/17  6:04 PM  Result Value Ref Range   WBC 8.4 4.0 - 10.5 K/uL   RBC 5.10 3.87 - 5.11 MIL/uL   Hemoglobin 15.2 (H) 12.0 - 15.0 g/dL   HCT 47.2 (H) 36.0 - 46.0 %   MCV 92.5 78.0 - 100.0 fL   MCH 29.8 26.0 - 34.0 pg   MCHC 32.2 30.0 - 36.0 g/dL   RDW 14.4 11.5 - 15.5 %   Platelets 145 (L) 150 - 400 K/uL   Neutrophils Relative % 71 %   Neutro Abs 6.0 1.7 - 7.7 K/uL   Lymphocytes Relative 15 %   Lymphs Abs 1.2 0.7 - 4.0 K/uL   Monocytes Relative 13 %   Monocytes Absolute 1.1 (H) 0.1 - 1.0 K/uL   Eosinophils Relative 1 %   Eosinophils Absolute 0.1 0.0 - 0.7 K/uL   Basophils Relative 0 %   Basophils Absolute 0.0 0.0 - 0.1 K/uL  D-dimer, quantitative (not at [facility])     Status: Abnormal   Collection Time: 03/23/17  6:04 PM  Result Value Ref Range   D-Dimer, Quant 1.75 (H) 0.00 - 0.50 ug/mL-FEU    Comment: (NOTE) At the manufacturer cut-off of 0.50 ug/mL FEU, this  assay has been documented to exclude PE with a sensitivity and negative predictive value of 97 to 99%.  At this time, this assay has not been approved by the FDA to exclude DVT/VTE. Results should be correlated with clinical presentation.   Magnesium     Status: None  Collection Time: 03/23/17  6:04 PM  Result Value Ref Range   Magnesium 2.0 1.7 - 2.4 mg/dL    Objective: General: Patient is awake, alert, and oriented x 3 and in no acute distress.  Integument: Skin is warm, dry and supple bilateral. Nails are tender, long, thickened and dystrophic with subungual debris, consistent with onychomycosis, 1-5 bilateral. No signs of infection. No open lesions. + reactive keratosis over left bunion with dry heme without ulceration. Remaining integument unremarkable.  Vasculature:  Dorsalis Pedis pulse 1/4 bilateral. Posterior Tibial pulse  0/4 bilateral. Capillary fill time <3 sec 1-5 bilateral. No hair growth to the level of the digits.Temperature gradient within normal limits. + varicosities present bilateral. Trace edema present bilateral.   Neurology: The patient has intact sensation measured with a 5.07/10g Semmes Weinstein Monofilament at all pedal sites bilateral . Vibratory sensation diminished bilateral with tuning fork. No Babinski sign present bilateral.   Musculoskeletal: + Bunion, hammertoes, pes planus symptomatic pedal deformities noted bilateral. Muscular strength 4/5 in all lower extremity muscular groups bilateral without pain on range of motion. No tenderness with calf compression bilateral however there is mild tenderness to anterior shins and ankles that is diffuse.   Gait rolling walker assisted with history of falls  Assessment and Plan: Problem List Items Addressed This Visit    None    Visit Diagnoses    Porokeratosis    -  Primary   Dermatophytosis of nail       Diabetic peripheral vascular disorder (HCC)       Type 2 diabetes, controlled, with neuropathy (HCC)        Hammer toe, unspecified laterality       Hallux valgus, acquired, unspecified laterality       Foot pain, bilateral         -Examined patient. -Discussed and educated patient on diabetic foot care, especially with regards to the vascular, neurological and musculoskeletal systems.  -Stressed the importance of good glycemic control and the detriment of not controlling glucose levels in relation to the foot. -Mechanically debrided callus 1 at left foot using sterile chisel blade and all nails 1-5 bilateral using sterile nail nipper and filed with dremel without incident  -Continue with bunion and hammertoe padding for left; dispensed foam bunion cushion for left -Recommend daily skin emollients/[place] healthy feet -Recommend aspercerme for general foot and leg pain  -Answered all patient questions -Patient to return as needed or in 3 months for at risk foot care -Patient advised to call the office if any problems or questions arise in the meantime.  Landis Martins, DPM

## 2017-04-18 DIAGNOSIS — H353211 Exudative age-related macular degeneration, right eye, with active choroidal neovascularization: Secondary | ICD-10-CM | POA: Diagnosis not present

## 2017-04-23 DIAGNOSIS — S9032XA Contusion of left foot, initial encounter: Secondary | ICD-10-CM | POA: Diagnosis not present

## 2017-04-24 ENCOUNTER — Ambulatory Visit: Payer: Medicare Other | Admitting: Sports Medicine

## 2017-05-01 ENCOUNTER — Ambulatory Visit: Payer: Medicare Other | Admitting: Sports Medicine

## 2017-05-02 DIAGNOSIS — E1165 Type 2 diabetes mellitus with hyperglycemia: Secondary | ICD-10-CM | POA: Diagnosis not present

## 2017-05-02 DIAGNOSIS — Z23 Encounter for immunization: Secondary | ICD-10-CM | POA: Diagnosis not present

## 2017-05-02 DIAGNOSIS — I10 Essential (primary) hypertension: Secondary | ICD-10-CM | POA: Diagnosis not present

## 2017-05-02 DIAGNOSIS — L84 Corns and callosities: Secondary | ICD-10-CM | POA: Diagnosis not present

## 2017-05-02 DIAGNOSIS — I5032 Chronic diastolic (congestive) heart failure: Secondary | ICD-10-CM | POA: Diagnosis not present

## 2017-05-02 DIAGNOSIS — E113299 Type 2 diabetes mellitus with mild nonproliferative diabetic retinopathy without macular edema, unspecified eye: Secondary | ICD-10-CM | POA: Diagnosis not present

## 2017-05-04 NOTE — Telephone Encounter (Signed)
error 

## 2017-05-08 ENCOUNTER — Ambulatory Visit (INDEPENDENT_AMBULATORY_CARE_PROVIDER_SITE_OTHER): Payer: Medicare Other | Admitting: Sports Medicine

## 2017-05-08 ENCOUNTER — Encounter: Payer: Self-pay | Admitting: Sports Medicine

## 2017-05-08 DIAGNOSIS — L84 Corns and callosities: Secondary | ICD-10-CM | POA: Diagnosis not present

## 2017-05-08 DIAGNOSIS — M79676 Pain in unspecified toe(s): Secondary | ICD-10-CM | POA: Diagnosis not present

## 2017-05-08 DIAGNOSIS — M79671 Pain in right foot: Secondary | ICD-10-CM

## 2017-05-08 DIAGNOSIS — M79672 Pain in left foot: Secondary | ICD-10-CM

## 2017-05-08 DIAGNOSIS — E1151 Type 2 diabetes mellitus with diabetic peripheral angiopathy without gangrene: Secondary | ICD-10-CM | POA: Diagnosis not present

## 2017-05-08 DIAGNOSIS — Q828 Other specified congenital malformations of skin: Secondary | ICD-10-CM

## 2017-05-08 DIAGNOSIS — M2010 Hallux valgus (acquired), unspecified foot: Secondary | ICD-10-CM

## 2017-05-08 DIAGNOSIS — B351 Tinea unguium: Secondary | ICD-10-CM | POA: Diagnosis not present

## 2017-05-08 NOTE — Progress Notes (Signed)
Subjective: Heidi Dominguez is a 83 y.o. female patient with history of diabetes who returns to office today complaining of pain at callus at bunion and long, painful nails  while ambulating in shoes; unable to trim. Patient states that the glucose is doing fine, 134 this AM, went to her PCP a few weeks ago because the callus at the side of her big toe started to turn dark red and she could not get in to see me so she went to her PCP where they started her on steroid, told her to soak and to stop her apsirin for 1 week. Patient desires widers shoes, denies any other pain at this time.    Patient is assisted by home nurse who helps with her care.   Patient Active Problem List   Diagnosis Date Noted  . Choledocholithiasis with chronic cholecystitis, nonobstructing 04/11/2015  . Pre-operative cardiovascular examination, high risk surgery   . Epigastric abdominal pain 04/08/2015  . Acute gallstone pancreatitis s/p lap chole w [facility] 04/11/2015 04/08/2015  . Diabetes mellitus type 2, controlled ([place]) 04/08/2015  . Gallstone pancreatitis 04/08/2015  . Mitral regurgitation 04/08/2015  . Chronic diastolic CHF (congestive heart failure) ([place]) 04/08/2015  . Dyspnea 03/31/2015  . Ischemic colitis ([place]) 08/09/2012  . Colitis 08/07/2012  . Rectal bleeding 08/07/2012  . Weakness 09/24/2011  . Bradycardia 09/24/2011  . UTI (lower urinary tract infection) 09/24/2011  . Hematochezia 09/24/2011  . DM (diabetes mellitus) ([place]) 09/24/2011  . Essential hypertension 09/24/2011  . Dyslipidemia 09/24/2011   Current Outpatient Prescriptions on File Prior to Visit  Medication Sig Dispense Refill  . acetaminophen (TYLENOL) 325 MG tablet Take 2 tablets (650 mg total) by mouth every 6 (six) hours as needed (or Fever >/= 101).    Marland Kitchen albuterol (PROVENTIL HFA;VENTOLIN HFA) 108 (90 Base) MCG/ACT inhaler Inhale 2 puffs into the lungs every 4 (four) hours as needed for wheezing or shortness of breath.    Marland Kitchen amLODipine  (NORVASC) 10 MG tablet Take 1 tablet (10 mg total) by mouth daily.    Marland Kitchen aspirin 81 MG tablet Take 81 mg by mouth daily.    . budesonide-formoterol (SYMBICORT) 160-4.5 MCG/ACT inhaler Inhale 2 puffs into the lungs 2 (two) times daily.    . Cholecalciferol (VITAMIN D3) 5000 UNITS CAPS Take 1 capsule by mouth daily.    . Ferrous Gluconate (IRON) 240 (27 FE) MG TABS Take 1 tablet by mouth daily.    . furosemide (LASIX) 20 MG tablet 1 TABLET (20 MG) BY MOUTH DAILY  6  . JANUVIA 50 MG tablet 1 TABLET (50 MG) BY MOUTH DAILY  5  . levalbuterol (XOPENEX) 1.25 MG/3ML nebulizer solution Take 1.25 mg by nebulization every 4 (four) hours as needed for wheezing.    . Magnesium 250 MG TABS Take 250 mg by mouth daily.     . methylPREDNISolone (MEDROL DOSEPAK) 4 MG TBPK tablet Take as instructed 21 tablet 0  . mirabegron ER (MYRBETRIQ) 50 MG TB24 tablet Take 50 mg by mouth daily.    . nitrofurantoin, macrocrystal-monohydrate, (MACROBID) 100 MG capsule Take 1 capsule (100 mg total) by mouth 2 (two) times daily. 10 capsule 0  . ondansetron (ZOFRAN ODT) 4 MG disintegrating tablet Take 1 tablet (4 mg total) by mouth every 8 (eight) hours as needed for nausea. 6 tablet 0  . potassium chloride (K-DUR,KLOR-CON) 10 MEQ tablet Take 10 mEq by mouth daily.     . pravastatin (PRAVACHOL) 40 MG tablet Take 40 mg  by mouth daily.    . Protein (PROCEL) POWD Take 1 scoop by mouth 2 (two) times daily.     . traMADol (ULTRAM) 50 MG tablet Take 1 tablet (50 mg total) by mouth every 6 (six) hours as needed for moderate pain or severe pain. 10 tablet 0  . VENTOLIN HFA 108 (90 BASE) MCG/ACT inhaler INHALE 2 PUFFS Q 4 H PRN  4  . vitamin B-12 (CYANOCOBALAMIN) 500 MCG tablet Take 500 mcg by mouth daily.    . ranitidine (ZANTAC) 150 MG tablet Take 1 tablet (150 mg total) by mouth 2 (two) times daily. 60 tablet 1   No current facility-administered medications on file prior to visit.    Allergies  Allergen Reactions  . Catapres  [Clonidine Hcl] Other (See Comments)    Unknown. Unknown.  Lorrin Goodell [Verapamil Hcl] Other (See Comments)    Unknown.  . Other Other (See Comments)    Any type of narcotics Feels "loopy" Any type of narcotics Feels "loopy"  . Sulfa Antibiotics Itching  . Sulfamethoxazole Diarrhea  . Verapamil Diarrhea and Other (See Comments)  . Sulfasalazine Itching    Recent Results (from the past 2160 hour(s))  Basic metabolic panel     Status: Abnormal   Collection Time: 03/23/17  6:04 PM  Result Value Ref Range   Sodium 140 135 - 145 mmol/L   Potassium 3.8 3.5 - 5.1 mmol/L   Chloride 98 (L) 101 - 111 mmol/L   CO2 32 22 - 32 mmol/L   Glucose, Bld 179 (H) 65 - 99 mg/dL   BUN 24 (H) 6 - 20 mg/dL   Creatinine, Ser 1.04 (H) 0.44 - 1.00 mg/dL   Calcium 9.7 8.9 - 10.3 mg/dL   GFR calc non Af Amer 45 (L) >60 mL/min   GFR calc Af Amer 52 (L) >60 mL/min    Comment: (NOTE) The eGFR has been calculated using the CKD EPI equation. This calculation has not been validated in all clinical situations. eGFR's persistently <60 mL/min signify possible Chronic Kidney Disease.    Anion gap 10 5 - 15  CBC with Differential     Status: Abnormal   Collection Time: 03/23/17  6:04 PM  Result Value Ref Range   WBC 8.4 4.0 - 10.5 K/uL   RBC 5.10 3.87 - 5.11 MIL/uL   Hemoglobin 15.2 (H) 12.0 - 15.0 g/dL   HCT 47.2 (H) 36.0 - 46.0 %   MCV 92.5 78.0 - 100.0 fL   MCH 29.8 26.0 - 34.0 pg   MCHC 32.2 30.0 - 36.0 g/dL   RDW 14.4 11.5 - 15.5 %   Platelets 145 (L) 150 - 400 K/uL   Neutrophils Relative % 71 %   Neutro Abs 6.0 1.7 - 7.7 K/uL   Lymphocytes Relative 15 %   Lymphs Abs 1.2 0.7 - 4.0 K/uL   Monocytes Relative 13 %   Monocytes Absolute 1.1 (H) 0.1 - 1.0 K/uL   Eosinophils Relative 1 %   Eosinophils Absolute 0.1 0.0 - 0.7 K/uL   Basophils Relative 0 %   Basophils Absolute 0.0 0.0 - 0.1 K/uL  D-dimer, quantitative (not at [facility])     Status: Abnormal   Collection Time: 03/23/17  6:04 PM  Result  Value Ref Range   D-Dimer, Quant 1.75 (H) 0.00 - 0.50 ug/mL-FEU    Comment: (NOTE) At the manufacturer cut-off of 0.50 ug/mL FEU, this assay has been documented to exclude PE with a sensitivity and negative predictive value  of 97 to 99%.  At this time, this assay has not been approved by the FDA to exclude DVT/VTE. Results should be correlated with clinical presentation.   Magnesium     Status: None   Collection Time: 03/23/17  6:04 PM  Result Value Ref Range   Magnesium 2.0 1.7 - 2.4 mg/dL    Objective: General: Patient is awake, alert, and oriented x 3 and in no acute distress.  Integument: Skin is warm, dry and supple bilateral. Nails are tender, long, thickened and dystrophic with subungual debris, consistent with onychomycosis, 1-5 bilateral. No signs of infection. No open lesions. + reactive keratosis over left bunion with dry heme without ulceration, pre-ulcerative in nature. Remaining integument unremarkable.  Vasculature:  Dorsalis Pedis pulse 1/4 bilateral. Posterior Tibial pulse  0/4 bilateral. Capillary fill time <3 sec 1-5 bilateral. No hair growth to the level of the digits.Temperature gradient within normal limits. + varicosities present bilateral. Trace edema present bilateral.   Neurology: The patient has intact sensation measured with a 5.07/10g Semmes Weinstein Monofilament at all pedal sites bilateral . Vibratory sensation diminished bilateral with tuning fork. No Babinski sign present bilateral.   Musculoskeletal: + Bunion, hammertoes, pes planus symptomatic pedal deformities noted bilateral. Muscular strength 4/5 in all lower extremity muscular groups bilateral without pain on range of motion. No tenderness with calf compression bilateral however there is mild tenderness to anterior shins and ankles that is diffuse as previous.   Gait rolling walker assisted with history of falls  Assessment and Plan: Problem List Items Addressed This Visit    None    Visit  Diagnoses    Diabetic peripheral vascular disorder ([place])    -  Primary   Pre-ulcerative calluses       Dermatophytosis of nail       Hallux valgus, acquired, unspecified laterality       Foot pain, bilateral         -Examined patient. -Discussed and educated patient on diabetic foot care, especially with regards to the vascular, neurological and musculoskeletal systems.  -Stressed the importance of good glycemic control and the detriment of not controlling glucose levels in relation to the foot. -Mechanically debrided callus 1 at left foot using sterile chisel blade and all nails 1-5 bilateral using sterile nail nipper and filed with dremel without incident  -Continue with bunion and hammertoe padding for left; dispensed foam bunion cushion for left -Recommend patient to consider custom shoes since she has a difficult time waring normal shoes; Rx given for [facility]  -Continue with OTC topical cream for pain and dry skin -Patient to return as needed or in 2.5 months for at risk foot care -Patient advised to call the office if any problems or questions arise in the meantime.  Landis Martins, DPM

## 2017-06-06 DIAGNOSIS — H353211 Exudative age-related macular degeneration, right eye, with active choroidal neovascularization: Secondary | ICD-10-CM | POA: Diagnosis not present

## 2017-06-14 DIAGNOSIS — N3281 Overactive bladder: Secondary | ICD-10-CM | POA: Diagnosis not present

## 2017-06-14 DIAGNOSIS — R351 Nocturia: Secondary | ICD-10-CM | POA: Diagnosis not present

## 2017-06-15 ENCOUNTER — Other Ambulatory Visit: Payer: Self-pay | Admitting: Gastroenterology

## 2017-06-15 DIAGNOSIS — R1110 Vomiting, unspecified: Secondary | ICD-10-CM

## 2017-06-15 DIAGNOSIS — K449 Diaphragmatic hernia without obstruction or gangrene: Secondary | ICD-10-CM | POA: Diagnosis not present

## 2017-06-20 ENCOUNTER — Other Ambulatory Visit: Payer: Self-pay | Admitting: Gastroenterology

## 2017-06-20 ENCOUNTER — Ambulatory Visit
Admission: RE | Admit: 2017-06-20 | Discharge: 2017-06-20 | Disposition: A | Discharge status: Home / Self Care | Payer: Medicare Other | Source: Ambulatory Visit | Attending: Gastroenterology | Admitting: Gastroenterology

## 2017-06-20 DIAGNOSIS — R1110 Vomiting, unspecified: Secondary | ICD-10-CM

## 2017-06-20 DIAGNOSIS — R1310 Dysphagia, unspecified: Secondary | ICD-10-CM | POA: Diagnosis not present

## 2017-06-25 DIAGNOSIS — K449 Diaphragmatic hernia without obstruction or gangrene: Secondary | ICD-10-CM | POA: Diagnosis not present

## 2017-06-26 DIAGNOSIS — H903 Sensorineural hearing loss, bilateral: Secondary | ICD-10-CM | POA: Diagnosis not present

## 2017-07-02 DIAGNOSIS — K219 Gastro-esophageal reflux disease without esophagitis: Secondary | ICD-10-CM | POA: Diagnosis not present

## 2017-07-02 DIAGNOSIS — K449 Diaphragmatic hernia without obstruction or gangrene: Secondary | ICD-10-CM | POA: Diagnosis not present

## 2017-07-12 DIAGNOSIS — R1310 Dysphagia, unspecified: Secondary | ICD-10-CM | POA: Diagnosis not present

## 2017-07-12 DIAGNOSIS — Z6832 Body mass index (BMI) 32.0-32.9, adult: Secondary | ICD-10-CM | POA: Diagnosis not present

## 2017-07-12 DIAGNOSIS — K449 Diaphragmatic hernia without obstruction or gangrene: Secondary | ICD-10-CM | POA: Diagnosis not present

## 2017-07-12 DIAGNOSIS — R112 Nausea with vomiting, unspecified: Secondary | ICD-10-CM | POA: Diagnosis not present

## 2017-07-17 ENCOUNTER — Ambulatory Visit (INDEPENDENT_AMBULATORY_CARE_PROVIDER_SITE_OTHER): Payer: Medicare Other | Admitting: Sports Medicine

## 2017-07-17 ENCOUNTER — Encounter: Payer: Self-pay | Admitting: Sports Medicine

## 2017-07-17 DIAGNOSIS — M79671 Pain in right foot: Secondary | ICD-10-CM

## 2017-07-17 DIAGNOSIS — E1151 Type 2 diabetes mellitus with diabetic peripheral angiopathy without gangrene: Secondary | ICD-10-CM

## 2017-07-17 DIAGNOSIS — M79672 Pain in left foot: Secondary | ICD-10-CM | POA: Diagnosis not present

## 2017-07-17 DIAGNOSIS — B351 Tinea unguium: Secondary | ICD-10-CM

## 2017-07-17 DIAGNOSIS — L84 Corns and callosities: Secondary | ICD-10-CM

## 2017-07-17 NOTE — Progress Notes (Signed)
Subjective: Heidi Dominguez is a 83 y.o. female patient with history of diabetes who returns to office today complaining of pain at callus at bunion and long, painful nails  while ambulating in shoes; unable to trim. Patient states that the glucose is doing fine, no changes. Patient denies any other pedal concerns at this time.   Patient Active Problem List   Diagnosis Date Noted  . Choledocholithiasis with chronic cholecystitis, nonobstructing 04/11/2015  . Pre-operative cardiovascular examination, high risk surgery   . Epigastric abdominal pain 04/08/2015  . Acute gallstone pancreatitis s/p lap chole w [facility] 04/11/2015 04/08/2015  . Diabetes mellitus type 2, controlled ([place]) 04/08/2015  . Gallstone pancreatitis 04/08/2015  . Mitral regurgitation 04/08/2015  . Chronic diastolic CHF (congestive heart failure) ([place]) 04/08/2015  . Dyspnea 03/31/2015  . Ischemic colitis ([place]) 08/09/2012  . Colitis 08/07/2012  . Rectal bleeding 08/07/2012  . Weakness 09/24/2011  . Bradycardia 09/24/2011  . UTI (lower urinary tract infection) 09/24/2011  . Hematochezia 09/24/2011  . DM (diabetes mellitus) ([place]) 09/24/2011  . Essential hypertension 09/24/2011  . Dyslipidemia 09/24/2011   Current Outpatient Medications on File Prior to Visit  Medication Sig Dispense Refill  . acetaminophen (TYLENOL) 325 MG tablet Take 2 tablets (650 mg total) by mouth every 6 (six) hours as needed (or Fever >/= 101).    Marland Kitchen albuterol (PROVENTIL HFA;VENTOLIN HFA) 108 (90 Base) MCG/ACT inhaler Inhale 2 puffs into the lungs every 4 (four) hours as needed for wheezing or shortness of breath.    Marland Kitchen amLODipine (NORVASC) 10 MG tablet Take 1 tablet (10 mg total) by mouth daily.    Marland Kitchen aspirin 81 MG tablet Take 81 mg by mouth daily.    . budesonide-formoterol (SYMBICORT) 160-4.5 MCG/ACT inhaler Inhale 2 puffs into the lungs 2 (two) times daily.    . Cholecalciferol (VITAMIN D3) 5000 UNITS CAPS Take 1 capsule by mouth daily.    .  Ferrous Gluconate (IRON) 240 (27 FE) MG TABS Take 1 tablet by mouth daily.    . furosemide (LASIX) 20 MG tablet 1 TABLET (20 MG) BY MOUTH DAILY  6  . JANUVIA 50 MG tablet 1 TABLET (50 MG) BY MOUTH DAILY  5  . levalbuterol (XOPENEX) 1.25 MG/3ML nebulizer solution Take 1.25 mg by nebulization every 4 (four) hours as needed for wheezing.    . Magnesium 250 MG TABS Take 250 mg by mouth daily.     . methylPREDNISolone (MEDROL DOSEPAK) 4 MG TBPK tablet Take as instructed 21 tablet 0  . mirabegron ER (MYRBETRIQ) 50 MG TB24 tablet Take 50 mg by mouth daily.    . nitrofurantoin, macrocrystal-monohydrate, (MACROBID) 100 MG capsule Take 1 capsule (100 mg total) by mouth 2 (two) times daily. 10 capsule 0  . ondansetron (ZOFRAN ODT) 4 MG disintegrating tablet Take 1 tablet (4 mg total) by mouth every 8 (eight) hours as needed for nausea. 6 tablet 0  . potassium chloride (K-DUR,KLOR-CON) 10 MEQ tablet Take 10 mEq by mouth daily.     . pravastatin (PRAVACHOL) 40 MG tablet Take 40 mg by mouth daily.    . Protein (PROCEL) POWD Take 1 scoop by mouth 2 (two) times daily.     . ranitidine (ZANTAC) 150 MG tablet Take 1 tablet (150 mg total) by mouth 2 (two) times daily. 60 tablet 1  . traMADol (ULTRAM) 50 MG tablet Take 1 tablet (50 mg total) by mouth every 6 (six) hours as needed for moderate pain or severe pain. 10 tablet  0  . VENTOLIN HFA 108 (90 BASE) MCG/ACT inhaler INHALE 2 PUFFS Q 4 H PRN  4  . vitamin B-12 (CYANOCOBALAMIN) 500 MCG tablet Take 500 mcg by mouth daily.     No current facility-administered medications on file prior to visit.    Allergies  Allergen Reactions  . Catapres [Clonidine Hcl] Other (See Comments)    Unknown. Unknown.  Lorrin Goodell [Verapamil Hcl] Other (See Comments)    Unknown.  . Other Other (See Comments)    Any type of narcotics Feels "loopy" Any type of narcotics Feels "loopy"  . Sulfa Antibiotics Itching  . Sulfamethoxazole Diarrhea  . Verapamil Diarrhea and Other (See  Comments)  . Sulfasalazine Itching    No results found for this or any previous visit (from the past 2160 hour(s)).  Objective: General: Patient is awake, alert, and oriented x 3 and in no acute distress.  Integument: Skin is warm, dry and supple bilateral. Nails are tender, long, thickened and dystrophic with subungual debris, consistent with onychomycosis, 1-5 bilateral. No signs of infection. No open lesions. + reactive keratosis over left bunion with dry heme without ulceration, pre-ulcerative in nature. Remaining integument unremarkable.  Vasculature:  Dorsalis Pedis pulse 1/4 bilateral. Posterior Tibial pulse  0/4 bilateral. Capillary fill time <3 sec 1-5 bilateral. No hair growth to the level of the digits.Temperature gradient within normal limits. + varicosities present bilateral. Trace edema present bilateral.   Neurology: The patient has intact sensation measured with a 5.07/10g Semmes Weinstein Monofilament at all pedal sites bilateral . Vibratory sensation diminished bilateral with tuning fork. No Babinski sign present bilateral.   Musculoskeletal: + Bunion, hammertoes, pes planus symptomatic pedal deformities noted bilateral. Muscular strength 4/5 in all lower extremity muscular groups bilateral without pain on range of motion. No tenderness with calf compression bilateral however there is mild tenderness to anterior shins and ankles that is diffuse as previous.   Gait rolling walker assisted with history of falls  Assessment and Plan: Problem List Items Addressed This Visit    None    Visit Diagnoses    Dermatophytosis of nail    -  Primary   Pre-ulcerative calluses       Diabetic peripheral vascular disorder (HCC)       Foot pain, bilateral         -Examined patient. -Discussed and educated patient on diabetic foot care, especially with regards to the vascular, neurological and musculoskeletal systems.  -Stressed the importance of good glycemic control and the detriment  of not controlling glucose levels in relation to the foot. -Mechanically smoothed callus 1 at left foot using rotary bur and debrided all nails 1-5 bilateral using sterile nail nipper and filed with dremel without incident  -Continue with bunion and hammertoe padding for left; dispensed bunion shield for left -Continue with good supportive shoes  -Continue with OTC topical cream for pain and dry skin -Patient to return as needed or in 2.5 months for at risk foot care -Patient advised to call the office if any problems or questions arise in the meantime.  Landis Martins, DPM

## 2017-08-01 DIAGNOSIS — H353211 Exudative age-related macular degeneration, right eye, with active choroidal neovascularization: Secondary | ICD-10-CM | POA: Diagnosis not present

## 2017-08-09 DIAGNOSIS — Z1231 Encounter for screening mammogram for malignant neoplasm of breast: Secondary | ICD-10-CM | POA: Diagnosis not present

## 2017-09-13 DIAGNOSIS — L720 Epidermal cyst: Secondary | ICD-10-CM | POA: Diagnosis not present

## 2017-09-13 DIAGNOSIS — L02811 Cutaneous abscess of head [any part, except face]: Secondary | ICD-10-CM | POA: Diagnosis not present

## 2017-09-13 DIAGNOSIS — L723 Sebaceous cyst: Secondary | ICD-10-CM | POA: Diagnosis not present

## 2017-09-18 DIAGNOSIS — E113513 Type 2 diabetes mellitus with proliferative diabetic retinopathy with macular edema, bilateral: Secondary | ICD-10-CM | POA: Diagnosis not present

## 2017-09-18 DIAGNOSIS — H353211 Exudative age-related macular degeneration, right eye, with active choroidal neovascularization: Secondary | ICD-10-CM | POA: Diagnosis not present

## 2017-09-18 DIAGNOSIS — H353121 Nonexudative age-related macular degeneration, left eye, early dry stage: Secondary | ICD-10-CM | POA: Diagnosis not present

## 2017-09-20 DIAGNOSIS — N3281 Overactive bladder: Secondary | ICD-10-CM | POA: Diagnosis not present

## 2017-09-20 DIAGNOSIS — R351 Nocturia: Secondary | ICD-10-CM | POA: Diagnosis not present

## 2017-09-24 DIAGNOSIS — E113293 Type 2 diabetes mellitus with mild nonproliferative diabetic retinopathy without macular edema, bilateral: Secondary | ICD-10-CM | POA: Diagnosis not present

## 2017-09-24 DIAGNOSIS — Z7984 Long term (current) use of oral hypoglycemic drugs: Secondary | ICD-10-CM | POA: Diagnosis not present

## 2017-09-24 DIAGNOSIS — I10 Essential (primary) hypertension: Secondary | ICD-10-CM | POA: Diagnosis not present

## 2017-09-25 ENCOUNTER — Ambulatory Visit (INDEPENDENT_AMBULATORY_CARE_PROVIDER_SITE_OTHER): Payer: Medicare Other | Admitting: Sports Medicine

## 2017-09-25 ENCOUNTER — Encounter: Payer: Self-pay | Admitting: Sports Medicine

## 2017-09-25 DIAGNOSIS — L84 Corns and callosities: Secondary | ICD-10-CM

## 2017-09-25 DIAGNOSIS — E1151 Type 2 diabetes mellitus with diabetic peripheral angiopathy without gangrene: Secondary | ICD-10-CM

## 2017-09-25 DIAGNOSIS — M79672 Pain in left foot: Secondary | ICD-10-CM

## 2017-09-25 DIAGNOSIS — B351 Tinea unguium: Secondary | ICD-10-CM | POA: Diagnosis not present

## 2017-09-25 DIAGNOSIS — M79671 Pain in right foot: Secondary | ICD-10-CM | POA: Diagnosis not present

## 2017-09-25 NOTE — Progress Notes (Signed)
Subjective: Heidi Dominguez is a 84 y.o. female patient with history of diabetes who returns to office today complaining of pain at callus at bunion and long, painful nails  while ambulating in shoes; unable to trim. Patient states that the glucose is doing fine,  154 this a.m. and does not know her A1c.  Patient states that pain was pretty intense over the last week over her callus and desires to discuss diabetic shoes since previous pair never worked well for her.  Patient denies any other pedal concerns at this time.   Patient Active Problem List   Diagnosis Date Noted  . Choledocholithiasis with chronic cholecystitis, nonobstructing 04/11/2015  . Pre-operative cardiovascular examination, high risk surgery   . Epigastric abdominal pain 04/08/2015  . Acute gallstone pancreatitis s/p lap chole w [facility] 04/11/2015 04/08/2015  . Diabetes mellitus type 2, controlled ([place]) 04/08/2015  . Gallstone pancreatitis 04/08/2015  . Mitral regurgitation 04/08/2015  . Chronic diastolic CHF (congestive heart failure) ([place]) 04/08/2015  . Dyspnea 03/31/2015  . Ischemic colitis ([place]) 08/09/2012  . Colitis 08/07/2012  . Rectal bleeding 08/07/2012  . Weakness 09/24/2011  . Bradycardia 09/24/2011  . UTI (lower urinary tract infection) 09/24/2011  . Hematochezia 09/24/2011  . DM (diabetes mellitus) ([place]) 09/24/2011  . Essential hypertension 09/24/2011  . Dyslipidemia 09/24/2011   Current Outpatient Medications on File Prior to Visit  Medication Sig Dispense Refill  . acetaminophen (TYLENOL) 325 MG tablet Take 2 tablets (650 mg total) by mouth every 6 (six) hours as needed (or Fever >/= 101).    Marland Kitchen albuterol (PROVENTIL HFA;VENTOLIN HFA) 108 (90 Base) MCG/ACT inhaler Inhale 2 puffs into the lungs every 4 (four) hours as needed for wheezing or shortness of breath.    Marland Kitchen amLODipine (NORVASC) 10 MG tablet Take 1 tablet (10 mg total) by mouth daily.    Marland Kitchen aspirin 81 MG tablet Take 81 mg by mouth daily.    .  budesonide-formoterol (SYMBICORT) 160-4.5 MCG/ACT inhaler Inhale 2 puffs into the lungs 2 (two) times daily.    . Cholecalciferol (VITAMIN D3) 5000 UNITS CAPS Take 1 capsule by mouth daily.    . Ferrous Gluconate (IRON) 240 (27 FE) MG TABS Take 1 tablet by mouth daily.    . furosemide (LASIX) 20 MG tablet 1 TABLET (20 MG) BY MOUTH DAILY  6  . JANUVIA 50 MG tablet 1 TABLET (50 MG) BY MOUTH DAILY  5  . levalbuterol (XOPENEX) 1.25 MG/3ML nebulizer solution Take 1.25 mg by nebulization every 4 (four) hours as needed for wheezing.    . Magnesium 250 MG TABS Take 250 mg by mouth daily.     . methylPREDNISolone (MEDROL DOSEPAK) 4 MG TBPK tablet Take as instructed 21 tablet 0  . mirabegron ER (MYRBETRIQ) 50 MG TB24 tablet Take 50 mg by mouth daily.    . nitrofurantoin, macrocrystal-monohydrate, (MACROBID) 100 MG capsule Take 1 capsule (100 mg total) by mouth 2 (two) times daily. 10 capsule 0  . ondansetron (ZOFRAN ODT) 4 MG disintegrating tablet Take 1 tablet (4 mg total) by mouth every 8 (eight) hours as needed for nausea. 6 tablet 0  . potassium chloride (K-DUR,KLOR-CON) 10 MEQ tablet Take 10 mEq by mouth daily.     . pravastatin (PRAVACHOL) 40 MG tablet Take 40 mg by mouth daily.    . Protein (PROCEL) POWD Take 1 scoop by mouth 2 (two) times daily.     . traMADol (ULTRAM) 50 MG tablet Take 1 tablet (50 mg total)  by mouth every 6 (six) hours as needed for moderate pain or severe pain. 10 tablet 0  . VENTOLIN HFA 108 (90 BASE) MCG/ACT inhaler INHALE 2 PUFFS Q 4 H PRN  4  . vitamin B-12 (CYANOCOBALAMIN) 500 MCG tablet Take 500 mcg by mouth daily.    . ranitidine (ZANTAC) 150 MG tablet Take 1 tablet (150 mg total) by mouth 2 (two) times daily. 60 tablet 1   No current facility-administered medications on file prior to visit.    Allergies  Allergen Reactions  . Catapres [Clonidine Hcl] Other (See Comments)    Unknown. Unknown.  Lorrin Goodell [Verapamil Hcl] Other (See Comments)    Unknown.  . Other  Other (See Comments)    Any type of narcotics Feels "loopy" Any type of narcotics Feels "loopy"  . Sulfa Antibiotics Itching  . Sulfamethoxazole Diarrhea  . Verapamil Diarrhea and Other (See Comments)  . Sulfasalazine Itching    No results found for this or any previous visit (from the past 2160 hour(s)).  Objective: General: Patient is awake, alert, and oriented x 3 and in no acute distress.  Integument: Skin is warm, dry and supple bilateral. Nails are tender, long, thickened and dystrophic with subungual debris, consistent with onychomycosis, 1-5 bilateral. No signs of infection. No open lesions. + reactive keratosis over left bunion with dry heme without ulceration, pre-ulcerative in nature. Remaining integument unremarkable.  Vasculature:  Dorsalis Pedis pulse 1/4 bilateral. Posterior Tibial pulse  0/4 bilateral. Capillary fill time <3 sec 1-5 bilateral. No hair growth to the level of the digits.Temperature gradient within normal limits. + varicosities present bilateral. Trace edema present bilateral.   Neurology: The patient has intact sensation measured with a 5.07/10g Semmes Weinstein Monofilament at all pedal sites bilateral . Vibratory sensation diminished bilateral with tuning fork. No Babinski sign present bilateral.   Musculoskeletal: + Bunion, hammertoes, pes planus symptomatic pedal deformities noted bilateral. Muscular strength 4/5 in all lower extremity muscular groups bilateral without pain on range of motion. No tenderness with calf compression bilateral however there is mild tenderness to anterior shins and ankles that is diffuse as previous.   Gait rolling walker assisted with history of falls  Assessment and Plan: Problem List Items Addressed This Visit    None    Visit Diagnoses    Dermatophytosis of nail    -  Primary   Pre-ulcerative calluses       Diabetic peripheral vascular disorder (HCC)       Foot pain, bilateral         -Examined  patient. -Discussed and educated patient on diabetic foot care, especially with regards to the vascular, neurological and musculoskeletal systems.  -Stressed the importance of good glycemic control and the detriment of not controlling glucose levels in relation to the foot. -Mechanically debrided using a sterile chisel blade callus 1 at left foot and debrided all nails 1-5 bilateral using sterile nail nipper and filed with dremel without incident  -Recommend antibiotic cream to pre-ulcerative area at left bunion where callus is to prevent against infection -Continue with bunion and hammertoe padding for left -Continue with good supportive shoes [employer] diabetic shoe order form was completed; office to contact primary care for approval / certification;  Office to arrange shoe dispensing.  Patient was fitted and measured for diabetic shoes this visit with Betha. -Continue with OTC topical cream for pain and dry skin -Patient to return for diabetic shoes when called or in 2.5 months for at risk foot  care -Patient advised to call the office if any problems or questions arise in the meantime.  Landis Martins, DPM

## 2017-09-26 ENCOUNTER — Encounter: Payer: Self-pay | Admitting: Sports Medicine

## 2017-10-22 DIAGNOSIS — H04123 Dry eye syndrome of bilateral lacrimal glands: Secondary | ICD-10-CM | POA: Diagnosis not present

## 2017-10-22 DIAGNOSIS — H524 Presbyopia: Secondary | ICD-10-CM | POA: Diagnosis not present

## 2017-10-22 DIAGNOSIS — Z961 Presence of intraocular lens: Secondary | ICD-10-CM | POA: Diagnosis not present

## 2017-10-22 DIAGNOSIS — H0100A Unspecified blepharitis right eye, upper and lower eyelids: Secondary | ICD-10-CM | POA: Diagnosis not present

## 2017-10-26 ENCOUNTER — Ambulatory Visit (INDEPENDENT_AMBULATORY_CARE_PROVIDER_SITE_OTHER): Payer: Medicare Other | Admitting: Orthotics

## 2017-10-26 DIAGNOSIS — M2010 Hallux valgus (acquired), unspecified foot: Secondary | ICD-10-CM | POA: Diagnosis not present

## 2017-10-26 DIAGNOSIS — E1151 Type 2 diabetes mellitus with diabetic peripheral angiopathy without gangrene: Secondary | ICD-10-CM

## 2017-10-26 DIAGNOSIS — E1140 Type 2 diabetes mellitus with diabetic neuropathy, unspecified: Secondary | ICD-10-CM

## 2017-10-26 DIAGNOSIS — M2040 Other hammer toe(s) (acquired), unspecified foot: Secondary | ICD-10-CM

## 2017-10-26 NOTE — Progress Notes (Signed)

## 2017-11-06 ENCOUNTER — Other Ambulatory Visit: Payer: Medicare Other | Admitting: Orthotics

## 2017-11-06 DIAGNOSIS — H353211 Exudative age-related macular degeneration, right eye, with active choroidal neovascularization: Secondary | ICD-10-CM | POA: Diagnosis not present

## 2017-11-08 ENCOUNTER — Ambulatory Visit: Payer: Medicare Other | Admitting: Orthotics

## 2017-11-08 DIAGNOSIS — L84 Corns and callosities: Secondary | ICD-10-CM

## 2017-11-08 NOTE — Progress Notes (Signed)
Reordered shoes different style 10.5

## 2017-11-28 ENCOUNTER — Telehealth: Payer: Self-pay | Admitting: Sports Medicine

## 2017-11-28 NOTE — Telephone Encounter (Signed)
Left message for pts daughter to call me back. Pts shoes came in and I was wanting to schedule pt to see Benjie Karvonen possibly on the same day as daughter is already scheduled.

## 2017-12-04 ENCOUNTER — Ambulatory Visit: Payer: Medicare Other

## 2017-12-10 ENCOUNTER — Telehealth: Payer: Self-pay | Admitting: Sports Medicine

## 2017-12-10 NOTE — Telephone Encounter (Signed)
Pts daughter left message on 4.24.19 for me to call her to r/s her mom's appt.    I returned her call and had to leave a message.I told her Benjie Karvonen will be here 5.7 or 5.14 and pt was already scheduled on 5.14 so I went ahead and held a spot her but for pts daughter to call to confirm.

## 2017-12-18 ENCOUNTER — Other Ambulatory Visit: Payer: Medicare Other

## 2017-12-18 DIAGNOSIS — E113293 Type 2 diabetes mellitus with mild nonproliferative diabetic retinopathy without macular edema, bilateral: Secondary | ICD-10-CM | POA: Diagnosis not present

## 2017-12-18 DIAGNOSIS — Z0000 Encounter for general adult medical examination without abnormal findings: Secondary | ICD-10-CM | POA: Diagnosis not present

## 2017-12-18 DIAGNOSIS — I10 Essential (primary) hypertension: Secondary | ICD-10-CM | POA: Diagnosis not present

## 2017-12-18 DIAGNOSIS — D696 Thrombocytopenia, unspecified: Secondary | ICD-10-CM | POA: Diagnosis not present

## 2017-12-18 DIAGNOSIS — R1310 Dysphagia, unspecified: Secondary | ICD-10-CM | POA: Diagnosis not present

## 2017-12-18 DIAGNOSIS — Z79899 Other long term (current) drug therapy: Secondary | ICD-10-CM | POA: Diagnosis not present

## 2017-12-18 DIAGNOSIS — Z7984 Long term (current) use of oral hypoglycemic drugs: Secondary | ICD-10-CM | POA: Diagnosis not present

## 2017-12-18 DIAGNOSIS — Z1389 Encounter for screening for other disorder: Secondary | ICD-10-CM | POA: Diagnosis not present

## 2017-12-18 DIAGNOSIS — G5603 Carpal tunnel syndrome, bilateral upper limbs: Secondary | ICD-10-CM | POA: Diagnosis not present

## 2017-12-20 DIAGNOSIS — H353211 Exudative age-related macular degeneration, right eye, with active choroidal neovascularization: Secondary | ICD-10-CM | POA: Diagnosis not present

## 2017-12-25 ENCOUNTER — Ambulatory Visit (INDEPENDENT_AMBULATORY_CARE_PROVIDER_SITE_OTHER): Payer: Medicare Other | Admitting: Sports Medicine

## 2017-12-25 ENCOUNTER — Encounter: Payer: Self-pay | Admitting: Sports Medicine

## 2017-12-25 ENCOUNTER — Ambulatory Visit: Payer: Medicare Other

## 2017-12-25 DIAGNOSIS — B351 Tinea unguium: Secondary | ICD-10-CM | POA: Diagnosis not present

## 2017-12-25 DIAGNOSIS — M79672 Pain in left foot: Secondary | ICD-10-CM | POA: Diagnosis not present

## 2017-12-25 DIAGNOSIS — M79671 Pain in right foot: Secondary | ICD-10-CM | POA: Diagnosis not present

## 2017-12-25 DIAGNOSIS — E1151 Type 2 diabetes mellitus with diabetic peripheral angiopathy without gangrene: Secondary | ICD-10-CM

## 2017-12-25 DIAGNOSIS — E1140 Type 2 diabetes mellitus with diabetic neuropathy, unspecified: Secondary | ICD-10-CM | POA: Diagnosis not present

## 2017-12-25 DIAGNOSIS — M2010 Hallux valgus (acquired), unspecified foot: Secondary | ICD-10-CM

## 2017-12-25 DIAGNOSIS — L84 Corns and callosities: Secondary | ICD-10-CM

## 2017-12-25 NOTE — Progress Notes (Signed)
Subjective: Heidi Dominguez is a 84 y.o. female patient with history of diabetes who returns to office today complaining of pain at callus at bunion and long, painful nails  while ambulating in shoes; unable to trim. Patient states that the glucose is doing fine, 158 this a.m. A1c unknown. Patient denies any other pedal concerns at this time.   Patient Active Problem List   Diagnosis Date Noted  . Sensorineural hearing loss (SNHL), bilateral 12/04/2016  . Post-nasal drainage 03/14/2016  . Presbycusis of both ears 03/14/2016  . Bilateral hearing loss 10/06/2015  . Bilateral impacted cerumen 10/06/2015  . 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 Subjective tinnitus of both ears 10/06/2015  . Choledocholithiasis with chronic cholecystitis, nonobstructing 04/11/2015  . Pre-operative cardiovascular examination, high risk surgery   . Epigastric abdominal pain 04/08/2015  . Acute gallstone pancreatitis s/p lap chole w [facility] 04/11/2015 04/08/2015  . Diabetes mellitus type 2, controlled ([place]) 04/08/2015  . Gallstone pancreatitis 04/08/2015  . Mitral regurgitation 04/08/2015  . Chronic diastolic CHF (congestive heart failure) ([place]) 04/08/2015  . Dyspnea 03/31/2015  . Ischemic colitis ([place]) 08/09/2012  . Colitis 08/07/2012  . Rectal bleeding 08/07/2012  . Weakness 09/24/2011  . Bradycardia 09/24/2011  . UTI (lower urinary tract infection) 09/24/2011  . Hematochezia 09/24/2011  . DM (diabetes mellitus) ([place]) 09/24/2011  . Essential hypertension 09/24/2011  . Dyslipidemia 09/24/2011   Current Outpatient Medications on File Prior to Visit  Medication Sig Dispense Refill  . acetaminophen (TYLENOL) 325 MG tablet Take 2 tablets (650 mg total) by mouth every 6 (six) hours as needed (or Fever >/= 101).    Marland Kitchen albuterol (PROVENTIL HFA;VENTOLIN HFA) 108 (90 Base) MCG/ACT inhaler Inhale 2 puffs into the lungs every 4 (four) hours as needed for wheezing or shortness of breath.    Marland Kitchen amLODipine  (NORVASC) 10 MG tablet Take 1 tablet (10 mg total) by mouth daily.    Marland Kitchen aspirin 81 MG tablet Take 81 mg by mouth daily.    . budesonide-formoterol (SYMBICORT) 160-4.5 MCG/ACT inhaler Inhale 2 puffs into the lungs 2 (two) times daily.    . Cholecalciferol (VITAMIN D3) 5000 UNITS CAPS Take 1 capsule by mouth daily.    . Ferrous Gluconate (IRON) 240 (27 FE) MG TABS Take 1 tablet by mouth daily.    . furosemide (LASIX) 20 MG tablet 1 TABLET (20 MG) BY MOUTH DAILY  6  . JANUVIA 50 MG tablet 1 TABLET (50 MG) BY MOUTH DAILY  5  . levalbuterol (XOPENEX) 1.25 MG/3ML nebulizer solution Take 1.25 mg by nebulization every 4 (four) hours as needed for wheezing.    . Magnesium 250 MG TABS Take 250 mg by mouth daily.     . methylPREDNISolone (MEDROL DOSEPAK) 4 MG TBPK tablet Take as instructed 21 tablet 0  . mirabegron ER (MYRBETRIQ) 50 MG TB24 tablet Take 50 mg by mouth daily.    . nitrofurantoin, macrocrystal-monohydrate, (MACROBID) 100 MG capsule Take 1 capsule (100 mg total) by mouth 2 (two) times daily. 10 capsule 0  . ondansetron (ZOFRAN ODT) 4 MG disintegrating tablet Take 1 tablet (4 mg total) by mouth every 8 (eight) hours as needed for nausea. 6 tablet 0  . potassium chloride (K-DUR,KLOR-CON) 10 MEQ tablet Take 10 mEq by mouth daily.     . pravastatin (PRAVACHOL) 40 MG tablet Take 40 mg by mouth daily.    . Protein (PROCEL) POWD Take 1 scoop by mouth 2 (two) times daily.     Marland Kitchen  traMADol (ULTRAM) 50 MG tablet Take 1 tablet (50 mg total) by mouth every 6 (six) hours as needed for moderate pain or severe pain. 10 tablet 0  . VENTOLIN HFA 108 (90 BASE) MCG/ACT inhaler INHALE 2 PUFFS Q 4 H PRN  4  . vitamin B-12 (CYANOCOBALAMIN) 500 MCG tablet Take 500 mcg by mouth daily.    . ranitidine (ZANTAC) 150 MG tablet Take 1 tablet (150 mg total) by mouth 2 (two) times daily. 60 tablet 1   No current facility-administered medications on file prior to visit.    Allergies  Allergen Reactions  . Catapres  [Clonidine Hcl] Other (See Comments)    Unknown. Unknown.  Lorrin Goodell [Verapamil Hcl] Other (See Comments)    Unknown.  . Other Other (See Comments)    Any type of narcotics Feels "loopy" Any type of narcotics Feels "loopy"  . Sulfa Antibiotics Itching  . Sulfamethoxazole Diarrhea  . Verapamil Diarrhea and Other (See Comments)  . Sulfasalazine Itching    No results found for this or any previous visit (from the past 2160 hour(s)).  Objective: General: Patient is awake, alert, and oriented x 3 and in no acute distress.  Integument: Skin is warm, dry and supple bilateral. Nails are tender, long, thickened and dystrophic with subungual debris, consistent with onychomycosis, 1-5 bilateral. No signs of infection. No open lesions. + reactive keratosis over left bunion with minimal heme without ulceration, pre-ulcerative in nature. Remaining integument unremarkable.  Vasculature:  Dorsalis Pedis pulse 1/4 bilateral. Posterior Tibial pulse  0/4 bilateral. Capillary fill time <3 sec 1-5 bilateral. No hair growth to the level of the digits.Temperature gradient within normal limits. + varicosities present bilateral. Trace edema present bilateral.   Neurology: The patient has intact sensation measured with a 5.07/10g Semmes Weinstein Monofilament at all pedal sites bilateral . Vibratory sensation diminished bilateral with tuning fork. No Babinski sign present bilateral.   Musculoskeletal: + Bunion, hammertoes, pes planus symptomatic pedal deformities noted bilateral. Muscular strength 4/5 in all lower extremity muscular groups bilateral without pain on range of motion. No tenderness with calf compression bilateral however there is mild tenderness to anterior shins and ankles that is diffuse as previous.   Gait rolling walker assisted with history of falls  Assessment and Plan: Problem List Items Addressed This Visit    None    Visit Diagnoses    Dermatophytosis of nail    -  Primary    Pre-ulcerative calluses       Diabetic peripheral vascular disorder (HCC)       Hallux valgus, acquired, unspecified laterality       Type 2 diabetes, controlled, with neuropathy ([place])       Foot pain, bilateral         -Examined patient. -Discussed and educated patient on diabetic foot care, especially with regards to the vascular, neurological and musculoskeletal systems.  -Stressed the importance of good glycemic control and the detriment of not controlling glucose levels in relation to the foot. -Mechanically debrided using a sterile chisel blade callus 1 at left foot at no charge and debrided all nails 1-5 bilateral using sterile nail nipper and filed with dremel without incident  -Continue with bunion and hammertoe padding for left as needed -Continue with Diabetic shoes -Patient to return for at risk foot care in 3 months  -Patient advised to call the office if any problems or questions arise in the meantime.  Landis Martins, DPM

## 2018-01-13 ENCOUNTER — Other Ambulatory Visit: Payer: Self-pay

## 2018-01-13 ENCOUNTER — Encounter (HOSPITAL_BASED_OUTPATIENT_CLINIC_OR_DEPARTMENT_OTHER): Payer: Self-pay | Admitting: Emergency Medicine

## 2018-01-13 ENCOUNTER — Emergency Department (HOSPITAL_BASED_OUTPATIENT_CLINIC_OR_DEPARTMENT_OTHER)
Admission: EM | Admit: 2018-01-13 | Discharge: 2018-01-13 | Disposition: A | Discharge status: Home / Self Care | Payer: Medicare Other | Attending: Emergency Medicine | Admitting: Emergency Medicine

## 2018-01-13 ENCOUNTER — Emergency Department (HOSPITAL_BASED_OUTPATIENT_CLINIC_OR_DEPARTMENT_OTHER): Payer: Medicare Other

## 2018-01-13 DIAGNOSIS — E119 Type 2 diabetes mellitus without complications: Secondary | ICD-10-CM | POA: Insufficient documentation

## 2018-01-13 DIAGNOSIS — Z7982 Long term (current) use of aspirin: Secondary | ICD-10-CM | POA: Insufficient documentation

## 2018-01-13 DIAGNOSIS — Y9389 Activity, other specified: Secondary | ICD-10-CM | POA: Insufficient documentation

## 2018-01-13 DIAGNOSIS — Z79899 Other long term (current) drug therapy: Secondary | ICD-10-CM | POA: Insufficient documentation

## 2018-01-13 DIAGNOSIS — Z7984 Long term (current) use of oral hypoglycemic drugs: Secondary | ICD-10-CM | POA: Diagnosis not present

## 2018-01-13 DIAGNOSIS — S99912A Unspecified injury of left ankle, initial encounter: Secondary | ICD-10-CM | POA: Diagnosis not present

## 2018-01-13 DIAGNOSIS — M7989 Other specified soft tissue disorders: Secondary | ICD-10-CM | POA: Diagnosis not present

## 2018-01-13 DIAGNOSIS — W19XXXA Unspecified fall, initial encounter: Secondary | ICD-10-CM

## 2018-01-13 DIAGNOSIS — I110 Hypertensive heart disease with heart failure: Secondary | ICD-10-CM | POA: Insufficient documentation

## 2018-01-13 DIAGNOSIS — S0990XA Unspecified injury of head, initial encounter: Secondary | ICD-10-CM | POA: Insufficient documentation

## 2018-01-13 DIAGNOSIS — Z9049 Acquired absence of other specified parts of digestive tract: Secondary | ICD-10-CM | POA: Diagnosis not present

## 2018-01-13 DIAGNOSIS — S199XXA Unspecified injury of neck, initial encounter: Secondary | ICD-10-CM | POA: Diagnosis not present

## 2018-01-13 DIAGNOSIS — S4992XA Unspecified injury of left shoulder and upper arm, initial encounter: Secondary | ICD-10-CM | POA: Diagnosis not present

## 2018-01-13 DIAGNOSIS — W01198A Fall on same level from slipping, tripping and stumbling with subsequent striking against other object, initial encounter: Secondary | ICD-10-CM | POA: Diagnosis not present

## 2018-01-13 DIAGNOSIS — M545 Low back pain: Secondary | ICD-10-CM | POA: Diagnosis not present

## 2018-01-13 DIAGNOSIS — I5032 Chronic diastolic (congestive) heart failure: Secondary | ICD-10-CM | POA: Diagnosis not present

## 2018-01-13 DIAGNOSIS — S40022A Contusion of left upper arm, initial encounter: Secondary | ICD-10-CM | POA: Diagnosis not present

## 2018-01-13 DIAGNOSIS — S39012A Strain of muscle, fascia and tendon of lower back, initial encounter: Secondary | ICD-10-CM | POA: Diagnosis not present

## 2018-01-13 DIAGNOSIS — M79622 Pain in left upper arm: Secondary | ICD-10-CM | POA: Diagnosis not present

## 2018-01-13 DIAGNOSIS — M25572 Pain in left ankle and joints of left foot: Secondary | ICD-10-CM | POA: Diagnosis not present

## 2018-01-13 DIAGNOSIS — Y998 Other external cause status: Secondary | ICD-10-CM | POA: Diagnosis not present

## 2018-01-13 DIAGNOSIS — R51 Headache: Secondary | ICD-10-CM | POA: Diagnosis not present

## 2018-01-13 DIAGNOSIS — Y92009 Unspecified place in unspecified non-institutional (private) residence as the place of occurrence of the external cause: Secondary | ICD-10-CM | POA: Diagnosis not present

## 2018-01-13 DIAGNOSIS — R4182 Altered mental status, unspecified: Secondary | ICD-10-CM | POA: Diagnosis not present

## 2018-01-13 NOTE — ED Notes (Signed)
Patient transported to CT 

## 2018-01-13 NOTE — ED Provider Notes (Signed)
[facility] Provider Note   CSN: [id number] Arrival date & time: 01/13/18  1826     History   Chief Complaint Chief Complaint  Patient presents with  . Fall    HPI Heidi Dominguez is a 84 y.o. female.  Patient is a 84 year old female who lives at home and presents after a fall.  The patient states that she has a bunion on her foot which gives her some trouble.  She states she was walking and her bunion was hurting and it caused her to fall.  She fell backward hitting her head on the floor.  She complains of pain in her head and neck.  She also has some pain in her low back and her left arm and left ankle.  She denies any other injuries.  She denies any hip pain.  She has been ambulatory since the fall and is been able to put weight on both legs.  Her daughter who is with her states that she is at her baseline mental status.  She has not had any recent fevers, increased weakness, vomiting or other recent symptoms.  Patient denies any chest pain or shortness of breath.  The daughter does state that she has been more hard of hearing recently and when that happens typically she has cerumen impactions.     Past Medical History:  Diagnosis Date  . Chronic diastolic CHF (congestive heart failure) ([place])   . Complication of anesthesia    I have a hard time waking up"  . Degenerative joint disease of shoulder region   . Diabetes mellitus   . GERD (gastroesophageal reflux disease)   . High cholesterol   . HOH (hard of hearing)   . Hypertension   . Osteoarthritis   . Pneumonia     Patient Active Problem List   Diagnosis Date Noted  . Sensorineural hearing loss (SNHL), bilateral 12/04/2016  . Post-nasal drainage 03/14/2016  . Presbycusis of both ears 03/14/2016  . Bilateral hearing loss 10/06/2015  . Bilateral impacted cerumen 10/06/2015  . 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 Subjective tinnitus of both ears 10/06/2015  .  Choledocholithiasis with chronic cholecystitis, nonobstructing 04/11/2015  . Pre-operative cardiovascular examination, high risk surgery   . Epigastric abdominal pain 04/08/2015  . Acute gallstone pancreatitis s/p lap chole w [facility] 04/11/2015 04/08/2015  . Diabetes mellitus type 2, controlled ([place]) 04/08/2015  . Gallstone pancreatitis 04/08/2015  . Mitral regurgitation 04/08/2015  . Chronic diastolic CHF (congestive heart failure) ([place]) 04/08/2015  . Dyspnea 03/31/2015  . Ischemic colitis ([place]) 08/09/2012  . Colitis 08/07/2012  . Rectal bleeding 08/07/2012  . Weakness 09/24/2011  . Bradycardia 09/24/2011  . UTI (lower urinary tract infection) 09/24/2011  . Hematochezia 09/24/2011  . DM (diabetes mellitus) ([place]) 09/24/2011  . Essential hypertension 09/24/2011  . Dyslipidemia 09/24/2011    Past Surgical History:  Procedure Laterality Date  . ABDOMINAL HYSTERECTOMY    . BACK SURGERY    . cataracts     bilateral  . KNEE SURGERY    . LAPAROSCOPIC CHOLECYSTECTOMY SINGLE SITE WITH INTRAOPERATIVE CHOLANGIOGRAM N/A 04/11/2015   Procedure: LAPAROSCOPIC LYSIS OF ADHESIONS, LAPAROSCOPIC CHOLECYSTECTOMY WITH INTRAOPERATIVE CHOLANGIOGRAM;  Surgeon: Michael Boston, MD;  Location: [facility];  Service: General;  Laterality: N/A;  . SHOULDER SURGERY       OB History   None      Home Medications    Prior to Admission medications   Medication Sig Start Date End Date  Taking? Authorizing Provider  acetaminophen (TYLENOL) 325 MG tablet Take 2 tablets (650 mg total) by mouth every 6 (six) hours as needed (or Fever >/= 101). 08/12/12   Nita Sells, MD  albuterol (PROVENTIL HFA;VENTOLIN HFA) 108 (90 Base) MCG/ACT inhaler Inhale 2 puffs into the lungs every 4 (four) hours as needed for wheezing or shortness of breath.    [provider]  amLODipine (NORVASC) 10 MG tablet Take 1 tablet (10 mg total) by mouth daily. 04/13/15   Hongalgi, Lenis Dickinson, MD  aspirin 81 MG tablet Take 81 mg by mouth  daily.    [provider]  budesonide-formoterol (SYMBICORT) 160-4.5 MCG/ACT inhaler Inhale 2 puffs into the lungs 2 (two) times daily. 04/13/15   Hongalgi, Lenis Dickinson, MD  Cholecalciferol (VITAMIN D3) 5000 UNITS CAPS Take 1 capsule by mouth daily.    [provider]  Ferrous Gluconate (IRON) 240 (27 FE) MG TABS Take 1 tablet by mouth daily.    [provider]  furosemide (LASIX) 20 MG tablet 1 TABLET (20 MG) BY MOUTH DAILY 03/22/15   [provider]  JANUVIA 50 MG tablet 1 TABLET (50 MG) BY MOUTH DAILY 03/11/15   [provider]  levalbuterol (XOPENEX) 1.25 MG/3ML nebulizer solution Take 1.25 mg by nebulization every 4 (four) hours as needed for wheezing.    [provider]  Magnesium 250 MG TABS Take 250 mg by mouth daily.     [provider]  methylPREDNISolone (MEDROL DOSEPAK) 4 MG TBPK tablet Take as instructed 03/28/17   Landis Martins, DPM  mirabegron ER (MYRBETRIQ) 50 MG TB24 tablet Take 50 mg by mouth daily.    [provider]  nitrofurantoin, macrocrystal-monohydrate, (MACROBID) 100 MG capsule Take 1 capsule (100 mg total) by mouth 2 (two) times daily. 05/12/16   Tanna Furry, MD  ondansetron (ZOFRAN ODT) 4 MG disintegrating tablet Take 1 tablet (4 mg total) by mouth every 8 (eight) hours as needed for nausea. 05/12/16   Tanna Furry, MD  potassium chloride (K-DUR,KLOR-CON) 10 MEQ tablet Take 10 mEq by mouth daily.  03/22/15   [provider]  pravastatin (PRAVACHOL) 40 MG tablet Take 40 mg by mouth daily.    [provider]  Protein (PROCEL) POWD Take 1 scoop by mouth 2 (two) times daily.     [provider]  ranitidine (ZANTAC) 150 MG tablet Take 1 tablet (150 mg total) by mouth 2 (two) times daily. 02/29/12 04/07/16  Dixie Dials, MD  traMADol (ULTRAM) 50 MG tablet Take 1 tablet (50 mg total) by mouth every 6 (six) hours as needed for moderate pain or severe pain. 03/23/17   Isla Pence, MD  VENTOLIN  HFA 108 (90 BASE) MCG/ACT inhaler INHALE 2 PUFFS Q 4 H PRN 02/22/15   [provider]  vitamin B-12 (CYANOCOBALAMIN) 500 MCG tablet Take 500 mcg by mouth daily.    [provider]    Family History Family History  Problem Relation Age of Onset  . Hypertension Mother   . Diabetes Mellitus I Mother   . Hypertension Father     Social History Social History   Tobacco Use  . Smoking status: Never Smoker  . Smokeless tobacco: Never Used  Substance Use Topics  . Alcohol use: No  . Drug use: No     Allergies   Catapres [clonidine hcl]; Covera-hs [verapamil hcl]; Other; Sulfa antibiotics; Sulfamethoxazole; Verapamil; and Sulfasalazine   Review of Systems Review of Systems  Constitutional: Negative for chills, diaphoresis, fatigue  and fever.  HENT: Negative for congestion, rhinorrhea and sneezing.   Eyes: Negative.   Respiratory: Negative for cough, chest tightness and shortness of breath.   Cardiovascular: Negative for chest pain and leg swelling.  Gastrointestinal: Negative for abdominal pain, blood in stool, diarrhea, nausea and vomiting.  Genitourinary: Negative for difficulty urinating, flank pain, frequency and hematuria.  Musculoskeletal: Positive for arthralgias, back pain and neck pain.  Skin: Negative for rash.  Neurological: Positive for headaches. Negative for dizziness, speech difficulty, weakness and numbness.     Physical Exam Updated Vital Signs BP 139/71 (BP Location: Right Arm)   Pulse 89   Temp 98 F (36.7 C) (Oral)   Resp 18   Wt 87.1 kg (192 lb)   SpO2 94%   BMI 32.96 kg/m   Physical Exam  Constitutional: She is oriented to person, place, and time. She appears well-developed and well-nourished.  HENT:  Head: Normocephalic and atraumatic.  Positive tenderness to the posterior scalp.  There is no overlying visible wounds  Eyes: Pupils are equal, round, and reactive to light.  Neck:  Patient has tenderness throughout the cervical  spine.  There is no pain to the thoracic spine.  There is some tenderness to the lower lumbosacral spine.  No step-offs or deformities are noted.  Cardiovascular: Normal rate, regular rhythm and normal heart sounds.  Pulmonary/Chest: Effort normal and breath sounds normal. No respiratory distress. She has no wheezes. She has no rales. She exhibits no tenderness.  Abdominal: Soft. Bowel sounds are normal. There is no tenderness. There is no rebound and no guarding.  Musculoskeletal: She exhibits no edema.  Patient has tenderness to the left arm including the shoulder and mid humerus.  No deformities are noted.  She has no significant pain to the elbow or wrist.  No hand pain.  Radial pulses are intact.  She also has tenderness in the left ankle.  There is no pain to the knee or hip.  She has full range of motion in the hips without discomfort.  There is no other pain on palpation or range of motion of the extremities.  Lymphadenopathy:    She has no cervical adenopathy.  Neurological: She is alert and oriented to person, place, and time. She has normal strength. No cranial nerve deficit or sensory deficit.  Skin: Skin is warm and dry. No rash noted.  Psychiatric: She has a normal mood and affect.     ED Treatments / Results  Labs (all labs ordered are listed, but only abnormal results are displayed) Labs Reviewed - No data to display  EKG None  Radiology Dg Lumbar Spine Complete  Result Date: 01/13/2018 CLINICAL DATA:  Low back pain after fall today. EXAM: LUMBAR SPINE - COMPLETE 4+ VIEW COMPARISON:  CT abdomen and pelvis 08/07/2012 FINDINGS: There are 5 non rib-bearing lumbar type vertebrae. Moderate lumbar dextroscoliosis is noted. There are chronic T12 and L1 compression fractures with severe height loss which are similar to the prior CT. Prior augmentation of an L3 compression fracture is again noted. There is some chronic remodeling of the L2 superior endplate. No definite acute fracture  is identified. Minimal retrolisthesis of L3 on L4 and at most trace anterolisthesis of L4 on L5 are unchanged. There is severe disc space narrowing at L2-3 and L5-S1. Multilevel vacuum disc phenomenon is noted. Right upper quadrant abdominal surgical clips are present, and there is atherosclerotic aortic calcification. IMPRESSION: Chronic compression fractures and disc degeneration without definite acute osseous abnormality. Electronically  Signed   By: Logan Bores M.D.   On: 01/13/2018 21:23   Dg Ankle Complete Left  Result Date: 01/13/2018 CLINICAL DATA:  Recent fall with ankle pain, initial encounter EXAM: LEFT ANKLE COMPLETE - 3+ VIEW COMPARISON:  03/23/2017 FINDINGS: Diffuse soft tissue swelling is noted. No acute fracture or dislocation is noted. Mild tarsal degenerative changes are seen. IMPRESSION: Soft tissue swelling without acute bony abnormality. Electronically Signed   By: Inez Catalina M.D.   On: 01/13/2018 21:20   Ct Head Wo Contrast  Result Date: 01/13/2018 CLINICAL DATA:  Fall. Head pain. Left shoulder pain. Initial encounter. EXAM: CT HEAD WITHOUT CONTRAST CT CERVICAL SPINE WITHOUT CONTRAST TECHNIQUE: Multidetector CT imaging of the head and cervical spine was performed following the standard protocol without intravenous contrast. Multiplanar CT image reconstructions of the cervical spine were also generated. COMPARISON:  03/25/2016 FINDINGS: CT HEAD FINDINGS Brain: There is no evidence of acute infarct, intracranial hemorrhage, mass, midline shift, or extra-axial fluid collection. Cerebral atrophy is unchanged and likely within normal limits for age. Patchy to confluent hypodensities throughout the cerebral white matter are unchanged and nonspecific but compatible with moderate chronic small vessel ischemic disease. Vascular: Calcified atherosclerosis at the skull base. No hyperdense vessel. Skull: No fracture focal osseous lesion. Sinuses/Orbits: Visualized paranasal sinuses and mastoid air  cells are clear. Bilateral cataract extraction is noted. Other: None. CT CERVICAL SPINE FINDINGS Alignment: Unchanged alignment including slight anterolisthesis of C2 on C3, C6 on C7, and C7 on T1 and slight retrolisthesis of C3 on C4, C4 on C5, and C5 on C6. Skull base and vertebrae: No acute fracture or destructive osseous process. Mildly tortuous left vertebral artery which results in lateral vertebral body scalloping at multiple levels. Soft tissues and spinal canal: No prevertebral fluid or swelling. No visible canal hematoma. Disc levels: Severe disc space narrowing from C3-4 to C6-7. Prominent degenerative endplate changes and spurring at C3-4 greater than C4-5 and C5-6. Milder disc degeneration at C2-3 and C7-T1. Moderate to severe multilevel facet arthrosis. Facet ankylosis bilaterally at C6-7. Moderate right neural foraminal stenosis from C3-4 to C5-6. No significant osseous spinal canal stenosis. Upper chest: Minimal left apical lung scarring. Other: Prominent streak artifact from bilateral shoulder arthroplasties partially obscure the soft tissues of the lower neck. A known large right thyroid nodule is partially obscured. There is moderate calcified atherosclerosis and a partially retropharyngeal course of both carotid arteries. IMPRESSION: 1. No evidence of acute intracranial abnormality. 2. Moderate chronic small vessel ischemic disease. 3. No acute cervical spine fracture. 4. Advanced cervical disc degeneration. Electronically Signed   By: Logan Bores M.D.   On: 01/13/2018 20:58   Ct Cervical Spine Wo Contrast  Result Date: 01/13/2018 CLINICAL DATA:  Fall. Head pain. Left shoulder pain. Initial encounter. EXAM: CT HEAD WITHOUT CONTRAST CT CERVICAL SPINE WITHOUT CONTRAST TECHNIQUE: Multidetector CT imaging of the head and cervical spine was performed following the standard protocol without intravenous contrast. Multiplanar CT image reconstructions of the cervical spine were also generated.  COMPARISON:  03/25/2016 FINDINGS: CT HEAD FINDINGS Brain: There is no evidence of acute infarct, intracranial hemorrhage, mass, midline shift, or extra-axial fluid collection. Cerebral atrophy is unchanged and likely within normal limits for age. Patchy to confluent hypodensities throughout the cerebral white matter are unchanged and nonspecific but compatible with moderate chronic small vessel ischemic disease. Vascular: Calcified atherosclerosis at the skull base. No hyperdense vessel. Skull: No fracture focal osseous lesion. Sinuses/Orbits: Visualized paranasal sinuses and mastoid air cells are  clear. Bilateral cataract extraction is noted. Other: None. CT CERVICAL SPINE FINDINGS Alignment: Unchanged alignment including slight anterolisthesis of C2 on C3, C6 on C7, and C7 on T1 and slight retrolisthesis of C3 on C4, C4 on C5, and C5 on C6. Skull base and vertebrae: No acute fracture or destructive osseous process. Mildly tortuous left vertebral artery which results in lateral vertebral body scalloping at multiple levels. Soft tissues and spinal canal: No prevertebral fluid or swelling. No visible canal hematoma. Disc levels: Severe disc space narrowing from C3-4 to C6-7. Prominent degenerative endplate changes and spurring at C3-4 greater than C4-5 and C5-6. Milder disc degeneration at C2-3 and C7-T1. Moderate to severe multilevel facet arthrosis. Facet ankylosis bilaterally at C6-7. Moderate right neural foraminal stenosis from C3-4 to C5-6. No significant osseous spinal canal stenosis. Upper chest: Minimal left apical lung scarring. Other: Prominent streak artifact from bilateral shoulder arthroplasties partially obscure the soft tissues of the lower neck. A known large right thyroid nodule is partially obscured. There is moderate calcified atherosclerosis and a partially retropharyngeal course of both carotid arteries. IMPRESSION: 1. No evidence of acute intracranial abnormality. 2. Moderate chronic small  vessel ischemic disease. 3. No acute cervical spine fracture. 4. Advanced cervical disc degeneration. Electronically Signed   By: Logan Bores M.D.   On: 01/13/2018 20:58   Dg Humerus Left  Result Date: 01/13/2018 CLINICAL DATA:  Status post fall, with left humerus pain. Initial encounter. EXAM: LEFT HUMERUS - 2+ VIEW COMPARISON:  Chest and right rib radiographs performed 03/30/2016 FINDINGS: There is no definite evidence of fracture or dislocation. The somewhat unusual alignment of the left humeral head prosthesis appears to be chronic in nature, with underlying chronic rotator cuff tear and degenerative osseous fragments. The distal left humerus appears intact. The elbow joint is grossly unremarkable in appearance. No elbow joint effusion is identified. IMPRESSION: No definite evidence of fracture or dislocation. Somewhat unusual alignment of the left humeral head prosthesis appears to be chronic in nature, with underlying chronic rotator cuff tear and degenerative osseous fragments. Electronically Signed   By: Garald Balding M.D.   On: 01/13/2018 21:27    Procedures Procedures (including critical care time)  Medications Ordered in ED Medications - No data to display   Initial Impression / Assessment and Plan / ED Course  I have reviewed the triage vital signs and the nursing notes.  Pertinent labs & imaging results that were available during my care of the patient were reviewed by me and considered in my medical decision making (see chart for details).     Patient is a 84 year old female who presents after a fall.  CT scan of the head and cervical spine showed no evidence of acute injuries.  X-rays of the extremities and low back show no evidence of acute injuries.  She is neurologically intact.  She is at her baseline mental status.  She is been able to ambulate.  She was discharged home in good condition.  Symptomatic care instructions were given.  Head injury precautions were given.   Daughter was advised to have the patient follow-up with her PCP for any ongoing concerns.  Return precautions were given.  Final Clinical Impressions(s) / ED Diagnoses   Final diagnoses:  Fall, initial encounter  Injury of head, initial encounter  Contusion of left upper arm, initial encounter  Back strain, initial encounter    ED Discharge Orders    None       Malvin Johns, MD 01/13/18 2213

## 2018-01-13 NOTE — ED Triage Notes (Signed)
Pt states she fell because her foot gave out. Pt c/o head pain and L shoulder pain.

## 2018-01-22 ENCOUNTER — Encounter

## 2018-01-22 ENCOUNTER — Encounter: Payer: Self-pay | Admitting: Sports Medicine

## 2018-01-22 ENCOUNTER — Ambulatory Visit (INDEPENDENT_AMBULATORY_CARE_PROVIDER_SITE_OTHER): Payer: Medicare Other | Admitting: Sports Medicine

## 2018-01-22 DIAGNOSIS — E1140 Type 2 diabetes mellitus with diabetic neuropathy, unspecified: Secondary | ICD-10-CM | POA: Diagnosis not present

## 2018-01-22 DIAGNOSIS — E1151 Type 2 diabetes mellitus with diabetic peripheral angiopathy without gangrene: Secondary | ICD-10-CM

## 2018-01-22 DIAGNOSIS — L84 Corns and callosities: Secondary | ICD-10-CM

## 2018-01-22 NOTE — Progress Notes (Signed)
Subjective: Heidi Dominguez is a 84 y.o. female patient with history of diabetes who returns to office today complaining of pain at callus at bunion; Reports history of fall and hurt her head but wanted to have her bunion looked at because of callus being painful. Patient denies any other pedal concerns at this time.   Patient Active Problem List   Diagnosis Date Noted  . Sensorineural hearing loss (SNHL), bilateral 12/04/2016  . Post-nasal drainage 03/14/2016  . Presbycusis of both ears 03/14/2016  . Bilateral hearing loss 10/06/2015  . Bilateral impacted cerumen 10/06/2015  . 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 Subjective tinnitus of both ears 10/06/2015  . Choledocholithiasis with chronic cholecystitis, nonobstructing 04/11/2015  . Pre-operative cardiovascular examination, high risk surgery   . Epigastric abdominal pain 04/08/2015  . Acute gallstone pancreatitis s/p lap chole w [facility] 04/11/2015 04/08/2015  . Diabetes mellitus type 2, controlled ([place]) 04/08/2015  . Gallstone pancreatitis 04/08/2015  . Mitral regurgitation 04/08/2015  . Chronic diastolic CHF (congestive heart failure) ([place]) 04/08/2015  . Dyspnea 03/31/2015  . Ischemic colitis ([place]) 08/09/2012  . Colitis 08/07/2012  . Rectal bleeding 08/07/2012  . Weakness 09/24/2011  . Bradycardia 09/24/2011  . UTI (lower urinary tract infection) 09/24/2011  . Hematochezia 09/24/2011  . DM (diabetes mellitus) ([place]) 09/24/2011  . Essential hypertension 09/24/2011  . Dyslipidemia 09/24/2011   Current Outpatient Medications on File Prior to Visit  Medication Sig Dispense Refill  . acetaminophen (TYLENOL) 325 MG tablet Take 2 tablets (650 mg total) by mouth every 6 (six) hours as needed (or Fever >/= 101).    Marland Kitchen albuterol (PROVENTIL HFA;VENTOLIN HFA) 108 (90 Base) MCG/ACT inhaler Inhale 2 puffs into the lungs every 4 (four) hours as needed for wheezing or shortness of breath.    Marland Kitchen amLODipine (NORVASC) 10 MG tablet  Take 1 tablet (10 mg total) by mouth daily.    Marland Kitchen aspirin 81 MG tablet Take 81 mg by mouth daily.    . budesonide-formoterol (SYMBICORT) 160-4.5 MCG/ACT inhaler Inhale 2 puffs into the lungs 2 (two) times daily.    . Cholecalciferol (VITAMIN D3) 5000 UNITS CAPS Take 1 capsule by mouth daily.    . Ferrous Gluconate (IRON) 240 (27 FE) MG TABS Take 1 tablet by mouth daily.    . furosemide (LASIX) 20 MG tablet 1 TABLET (20 MG) BY MOUTH DAILY  6  . JANUVIA 50 MG tablet 1 TABLET (50 MG) BY MOUTH DAILY  5  . levalbuterol (XOPENEX) 1.25 MG/3ML nebulizer solution Take 1.25 mg by nebulization every 4 (four) hours as needed for wheezing.    . Magnesium 250 MG TABS Take 250 mg by mouth daily.     . methylPREDNISolone (MEDROL DOSEPAK) 4 MG TBPK tablet Take as instructed 21 tablet 0  . mirabegron ER (MYRBETRIQ) 50 MG TB24 tablet Take 50 mg by mouth daily.    . nitrofurantoin, macrocrystal-monohydrate, (MACROBID) 100 MG capsule Take 1 capsule (100 mg total) by mouth 2 (two) times daily. 10 capsule 0  . ondansetron (ZOFRAN ODT) 4 MG disintegrating tablet Take 1 tablet (4 mg total) by mouth every 8 (eight) hours as needed for nausea. 6 tablet 0  . potassium chloride (K-DUR,KLOR-CON) 10 MEQ tablet Take 10 mEq by mouth daily.     . pravastatin (PRAVACHOL) 40 MG tablet Take 40 mg by mouth daily.    . Protein (PROCEL) POWD Take 1 scoop by mouth 2 (two) times daily.     . ranitidine (ZANTAC) 150  MG tablet Take 1 tablet (150 mg total) by mouth 2 (two) times daily. 60 tablet 1  . traMADol (ULTRAM) 50 MG tablet Take 1 tablet (50 mg total) by mouth every 6 (six) hours as needed for moderate pain or severe pain. 10 tablet 0  . VENTOLIN HFA 108 (90 BASE) MCG/ACT inhaler INHALE 2 PUFFS Q 4 H PRN  4  . vitamin B-12 (CYANOCOBALAMIN) 500 MCG tablet Take 500 mcg by mouth daily.     No current facility-administered medications on file prior to visit.    Allergies  Allergen Reactions  . Catapres [Clonidine Hcl] Other (See  Comments)    Unknown. Unknown.  Lorrin Goodell [Verapamil Hcl] Other (See Comments)    Unknown.  . Other Other (See Comments)    Any type of narcotics Feels "loopy" Any type of narcotics Feels "loopy"  . Sulfa Antibiotics Itching  . Sulfamethoxazole Diarrhea  . Verapamil Diarrhea and Other (See Comments)  . Sulfasalazine Itching    No results found for this or any previous visit (from the past 2160 hour(s)).  Objective: General: Patient is awake, alert, and oriented x 3 and in no acute distress.  Integument: Skin is warm, dry and supple bilateral. Nails are short, thickened and dystrophic with subungual debris, consistent with onychomycosis, 1-5 bilateral. No signs of infection. No open lesions. + reactive keratosis over left bunion with minimal heme without ulceration, pre-ulcerative in nature. Remaining integument unremarkable.  Vasculature:  Dorsalis Pedis pulse 1/4 bilateral. Posterior Tibial pulse  0/4 bilateral. Capillary fill time <3 sec 1-5 bilateral. No hair growth to the level of the digits.Temperature gradient within normal limits. + varicosities present bilateral. Trace edema present bilateral.   Neurology: The patient has intact sensation measured with a 5.07/10g Semmes Weinstein Monofilament at all pedal sites bilateral . Vibratory sensation diminished bilateral with tuning fork. No Babinski sign present bilateral.   Musculoskeletal: + Bunion, hammertoes, pes planus symptomatic pedal deformities noted bilateral. Muscular strength 4/5 in all lower extremity muscular groups bilateral without pain on range of motion. No tenderness with calf compression bilateral however there is mild tenderness to anterior shins and ankles that is diffuse as previous.   Gait rolling walker assisted with history of falls  Assessment and Plan: Problem List Items Addressed This Visit    None    Visit Diagnoses    Pre-ulcerative calluses    -  Primary   Diabetic peripheral vascular disorder  (HCC)       Type 2 diabetes, controlled, with neuropathy ([place])         -Examined patient. -Discussed and educated patient on diabetic foot care, especially with regards to the vascular, neurological and musculoskeletal systems.  -Stressed the importance of good glycemic control and the detriment of not controlling glucose levels in relation to the foot. -Mechanically debrided using a sterile chisel blade callus 1 at left foot at no charge  -Dispensed bunion cushion  -Continue with Diabetic shoes  -Patient to return as scheduled for routine foot care   -Patient advised to call the office if any problems or questions arise in the meantime.  Landis Martins, DPM

## 2018-01-26 ENCOUNTER — Ambulatory Visit: Payer: Medicare Other | Admitting: Sports Medicine

## 2018-01-29 ENCOUNTER — Ambulatory Visit: Payer: Medicare Other | Admitting: Sports Medicine

## 2018-02-07 DIAGNOSIS — H353211 Exudative age-related macular degeneration, right eye, with active choroidal neovascularization: Secondary | ICD-10-CM | POA: Diagnosis not present

## 2018-03-12 ENCOUNTER — Ambulatory Visit (INDEPENDENT_AMBULATORY_CARE_PROVIDER_SITE_OTHER): Payer: Medicare Other | Admitting: Sports Medicine

## 2018-03-12 ENCOUNTER — Encounter: Payer: Self-pay | Admitting: Sports Medicine

## 2018-03-12 DIAGNOSIS — E1140 Type 2 diabetes mellitus with diabetic neuropathy, unspecified: Secondary | ICD-10-CM

## 2018-03-12 DIAGNOSIS — B351 Tinea unguium: Secondary | ICD-10-CM | POA: Diagnosis not present

## 2018-03-12 DIAGNOSIS — M79672 Pain in left foot: Secondary | ICD-10-CM

## 2018-03-12 DIAGNOSIS — L84 Corns and callosities: Secondary | ICD-10-CM

## 2018-03-12 DIAGNOSIS — M79671 Pain in right foot: Secondary | ICD-10-CM | POA: Diagnosis not present

## 2018-03-12 DIAGNOSIS — E1151 Type 2 diabetes mellitus with diabetic peripheral angiopathy without gangrene: Secondary | ICD-10-CM

## 2018-03-12 DIAGNOSIS — M2010 Hallux valgus (acquired), unspecified foot: Secondary | ICD-10-CM

## 2018-03-12 NOTE — Progress Notes (Signed)
Subjective: Heidi Dominguez is a 84 y.o. female patient with history of diabetes who returns to office today complaining of pain at callus at bunion and long, painful nails  while ambulating in shoes; unable to trim. Patient states that the glucose is doing fine, 127 this a.m. A1c unknown. Patient has slipping of shoes and is to see Liliane Channel today. Patient denies any other pedal concerns at this time.   Patient Active Problem List   Diagnosis Date Noted  . Sensorineural hearing loss (SNHL), bilateral 12/04/2016  . Post-nasal drainage 03/14/2016  . Presbycusis of both ears 03/14/2016  . Bilateral hearing loss 10/06/2015  . Bilateral impacted cerumen 10/06/2015  . 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 Subjective tinnitus of both ears 10/06/2015  . Choledocholithiasis with chronic cholecystitis, nonobstructing 04/11/2015  . Pre-operative cardiovascular examination, high risk surgery   . Epigastric abdominal pain 04/08/2015  . Acute gallstone pancreatitis s/p lap chole w [facility] 04/11/2015 04/08/2015  . Diabetes mellitus type 2, controlled ([place]) 04/08/2015  . Gallstone pancreatitis 04/08/2015  . Mitral regurgitation 04/08/2015  . Chronic diastolic CHF (congestive heart failure) ([place]) 04/08/2015  . Dyspnea 03/31/2015  . Ischemic colitis ([place]) 08/09/2012  . Colitis 08/07/2012  . Rectal bleeding 08/07/2012  . Weakness 09/24/2011  . Bradycardia 09/24/2011  . UTI (lower urinary tract infection) 09/24/2011  . Hematochezia 09/24/2011  . DM (diabetes mellitus) ([place]) 09/24/2011  . Essential hypertension 09/24/2011  . Dyslipidemia 09/24/2011   Current Outpatient Medications on File Prior to Visit  Medication Sig Dispense Refill  . acetaminophen (TYLENOL) 325 MG tablet Take 2 tablets (650 mg total) by mouth every 6 (six) hours as needed (or Fever >/= 101).    Marland Kitchen albuterol (PROVENTIL HFA;VENTOLIN HFA) 108 (90 Base) MCG/ACT inhaler Inhale 2 puffs into the lungs every 4 (four) hours as  needed for wheezing or shortness of breath.    Marland Kitchen amLODipine (NORVASC) 10 MG tablet Take 1 tablet (10 mg total) by mouth daily.    Marland Kitchen aspirin 81 MG tablet Take 81 mg by mouth daily.    . budesonide-formoterol (SYMBICORT) 160-4.5 MCG/ACT inhaler Inhale 2 puffs into the lungs 2 (two) times daily.    . Cholecalciferol (VITAMIN D3) 5000 UNITS CAPS Take 1 capsule by mouth daily.    . Ferrous Gluconate (IRON) 240 (27 FE) MG TABS Take 1 tablet by mouth daily.    . furosemide (LASIX) 20 MG tablet 1 TABLET (20 MG) BY MOUTH DAILY  6  . JANUVIA 50 MG tablet 1 TABLET (50 MG) BY MOUTH DAILY  5  . levalbuterol (XOPENEX) 1.25 MG/3ML nebulizer solution Take 1.25 mg by nebulization every 4 (four) hours as needed for wheezing.    . Magnesium 250 MG TABS Take 250 mg by mouth daily.     . methylPREDNISolone (MEDROL DOSEPAK) 4 MG TBPK tablet Take as instructed 21 tablet 0  . mirabegron ER (MYRBETRIQ) 50 MG TB24 tablet Take 50 mg by mouth daily.    . nitrofurantoin, macrocrystal-monohydrate, (MACROBID) 100 MG capsule Take 1 capsule (100 mg total) by mouth 2 (two) times daily. 10 capsule 0  . ondansetron (ZOFRAN ODT) 4 MG disintegrating tablet Take 1 tablet (4 mg total) by mouth every 8 (eight) hours as needed for nausea. 6 tablet 0  . potassium chloride (K-DUR,KLOR-CON) 10 MEQ tablet Take 10 mEq by mouth daily.     . pravastatin (PRAVACHOL) 40 MG tablet Take 40 mg by mouth daily.    . Protein (PROCEL) POWD Take 1  scoop by mouth 2 (two) times daily.     . traMADol (ULTRAM) 50 MG tablet Take 1 tablet (50 mg total) by mouth every 6 (six) hours as needed for moderate pain or severe pain. 10 tablet 0  . VENTOLIN HFA 108 (90 BASE) MCG/ACT inhaler INHALE 2 PUFFS Q 4 H PRN  4  . vitamin B-12 (CYANOCOBALAMIN) 500 MCG tablet Take 500 mcg by mouth daily.    . ranitidine (ZANTAC) 150 MG tablet Take 1 tablet (150 mg total) by mouth 2 (two) times daily. 60 tablet 1   No current facility-administered medications on file prior to  visit.    Allergies  Allergen Reactions  . Catapres [Clonidine Hcl] Other (See Comments)    Unknown. Unknown.  Lorrin Goodell [Verapamil Hcl] Other (See Comments)    Unknown.  . Other Other (See Comments)    Any type of narcotics Feels "loopy" Any type of narcotics Feels "loopy"  . Prednisone Cough  . Sulfa Antibiotics Itching  . Sulfamethoxazole Diarrhea  . Verapamil Diarrhea and Other (See Comments)  . Sulfasalazine Itching    No results found for this or any previous visit (from the past 2160 hour(s)).  Objective: General: Patient is awake, alert, and oriented x 3 and in no acute distress.  Integument: Skin is warm, dry and supple bilateral. Nails are tender, long, thickened and dystrophic with subungual debris, consistent with onychomycosis, 1-5 bilateral. No signs of infection. No open lesions. + reactive keratosis over left bunion with minimal heme without ulceration, pre-ulcerative in nature. Remaining integument unremarkable.  Vasculature:  Dorsalis Pedis pulse 1/4 bilateral. Posterior Tibial pulse  0/4 bilateral. Capillary fill time <3 sec 1-5 bilateral. No hair growth to the level of the digits.Temperature gradient within normal limits. + varicosities present bilateral. Trace edema present bilateral.   Neurology: The patient has intact sensation measured with a 5.07/10g Semmes Weinstein Monofilament at all pedal sites bilateral . Vibratory sensation diminished bilateral with tuning fork. No Babinski sign present bilateral.   Musculoskeletal: + Bunion, hammertoes, pes planus symptomatic pedal deformities noted bilateral. Muscular strength 4/5 in all lower extremity muscular groups bilateral without pain on range of motion. No tenderness with calf compression bilateral however there is mild tenderness to anterior shins and ankles that is diffuse as previous.   Gait rolling walker assisted with history of falls  Assessment and Plan: Problem List Items Addressed This Visit     None    Visit Diagnoses    Pre-ulcerative calluses    -  Primary   Diabetic peripheral vascular disorder (HCC)       Type 2 diabetes, controlled, with neuropathy (HCC)       Dermatophytosis of nail       Hallux valgus, acquired, unspecified laterality       Foot pain, bilateral         -Examined patient. -Discussed and educated patient on diabetic foot care, especially with regards to the vascular, neurological and musculoskeletal systems.  -Stressed the importance of good glycemic control and the detriment of not controlling glucose levels in relation to the foot. -Mechanically debrided using a sterile chisel blade callus 1 at left foot at no charge and debrided all nails 1-5 bilateral using sterile nail nipper and filed with dremel without incident  -Continue with bunion and hammertoe padding for left as needed -Patient to see Liliane Channel for diabetic shoe adjustments for heel slipage -Patient to return for at risk foot care in 3 months  -Patient advised to call  the office if any problems or questions arise in the meantime.  Winson Eichorn, DPM 

## 2018-03-13 ENCOUNTER — Encounter: Payer: Self-pay | Admitting: Internal Medicine

## 2018-03-13 ENCOUNTER — Ambulatory Visit (INDEPENDENT_AMBULATORY_CARE_PROVIDER_SITE_OTHER): Payer: Medicare Other | Admitting: Internal Medicine

## 2018-03-13 ENCOUNTER — Ambulatory Visit (INDEPENDENT_AMBULATORY_CARE_PROVIDER_SITE_OTHER)
Admission: RE | Admit: 2018-03-13 | Discharge: 2018-03-13 | Disposition: A | Discharge status: Home / Self Care | Payer: Medicare Other | Source: Ambulatory Visit | Attending: Internal Medicine | Admitting: Internal Medicine

## 2018-03-13 ENCOUNTER — Other Ambulatory Visit (INDEPENDENT_AMBULATORY_CARE_PROVIDER_SITE_OTHER): Payer: Medicare Other

## 2018-03-13 VITALS — BP 110/78 | HR 99 | Temp 99.0°F | Ht 64.0 in | Wt 180.0 lb

## 2018-03-13 DIAGNOSIS — R058 Other specified cough: Secondary | ICD-10-CM

## 2018-03-13 DIAGNOSIS — R05 Cough: Secondary | ICD-10-CM

## 2018-03-13 LAB — CBC WITH DIFFERENTIAL/PLATELET
BASOS ABS: 0.1 10*3/uL (ref 0.0–0.1)
Basophils Relative: 0.9 % (ref 0.0–3.0)
Eosinophils Absolute: 0.1 10*3/uL (ref 0.0–0.7)
Eosinophils Relative: 1.1 % (ref 0.0–5.0)
HEMATOCRIT: 43.5 % (ref 36.0–46.0)
Hemoglobin: 14.4 g/dL (ref 12.0–15.0)
LYMPHS ABS: 1.5 10*3/uL (ref 0.7–4.0)
Lymphocytes Relative: 15.2 % (ref 12.0–46.0)
MCHC: 33.1 g/dL (ref 30.0–36.0)
MCV: 93.7 fl (ref 78.0–100.0)
MONOS PCT: 9.1 % (ref 3.0–12.0)
Monocytes Absolute: 0.9 10*3/uL (ref 0.1–1.0)
NEUTROS ABS: 7.2 10*3/uL (ref 1.4–7.7)
NEUTROS PCT: 73.7 % (ref 43.0–77.0)
Platelets: 159 10*3/uL (ref 150.0–400.0)
RBC: 4.64 Mil/uL (ref 3.87–5.11)
RDW: 14 % (ref 11.5–15.5)
WBC: 9.8 10*3/uL (ref 4.0–10.5)

## 2018-03-13 MED ORDER — ACETAMINOPHEN-CODEINE #3 300-30 MG PO TABS: 1.0000 | ORAL_TABLET | ORAL | 0 refills | Status: AC | PRN

## 2018-03-13 MED ORDER — ALBUTEROL SULFATE (2.5 MG/3ML) 0.083% IN NEBU: 2.5000 mg | INHALATION_SOLUTION | Freq: Four times a day (QID) | RESPIRATORY_TRACT | 12 refills | Status: DC | PRN

## 2018-03-13 MED ORDER — FAMOTIDINE 20 MG PO TABS
ORAL_TABLET | ORAL | 11 refills | Status: DC
Start: 2018-03-13 — End: 2018-03-27

## 2018-03-13 MED ORDER — PANTOPRAZOLE SODIUM 40 MG PO TBEC
40.0000 mg | DELAYED_RELEASE_TABLET | Freq: Every day | ORAL | 2 refills | Status: DC
Start: 2018-03-13 — End: 2018-06-07

## 2018-03-13 NOTE — Patient Instructions (Addendum)
Stop the fish oil  And cough drops and trospium   Only use your albuterol as a rescue medication to be used if you can't catch your breath by resting or doing a relaxed purse lip breathing pattern.  - The less you use it, the better it will work when you need it. - Ok to use up to  every 4 hours if you must but call for immediate appointment if use goes up over your usual need   Pantoprazole (protonix) 40 mg   Take  30-60 min before first meal of the day and Pepcid (famotidine)  20 mg one @  bedtime until return to office - this is the best way to tell whether stomach acid is contributing to your problem.     GERD (REFLUX)  is an extremely common cause of respiratory symptoms just like yours , many times with no obvious heartburn at all.    It can be treated with medication, but also with lifestyle changes including elevation of the head of your bed (ideally with 6 inch  bed blocks),  Smoking cessation, avoidance of late meals, excessive alcohol, and avoid fatty foods, chocolate, peppermint, colas, red wine, and acidic juices such as orange juice.  NO MINT OR MENTHOL PRODUCTS SO NO COUGH DROPS   USE SUGARLESS CANDY INSTEAD (Jolley ranchers or Stover's or Life Savers) or even ice chips will also do - the key is to swallow to prevent all throat clearing. NO OIL BASED VITAMINS - use powdered substitutes.    Take delsym two tsp every 12 hours and supplement if needed with  Tylenol #3  mg up to 1 every 4 hours to suppress the urge to cough. Swallowing water and/or using ice chips/non mint and menthol containing candies (such as lifesavers or sugarless jolly ranchers) are also effective.  You should rest your voice and avoid activities that you know make you cough.  Once you have eliminated the cough for 3 straight days try reducing the tylenol #3   first,  then the delsym as tolerated.     Please remember to go to the lab and x-ray department downstairs in the basement  for your tests - we will call  you with the results when they are available.   Please schedule a follow up office visit in 2  weeks, sooner if needed  with all medications /inhalers/ solutions in hand so we can verify exactly what you are taking. This includes all medications from all doctors and over the counters      .

## 2018-03-13 NOTE — Progress Notes (Signed)
Subjective:    Patient ID: Heidi Dominguez, female    DOB: 25-Jun-1921,    MRN: [id number]    Brief patient profile:  89 yobf never smoker never asthma some sinus issues in fall > spring new onset wheezing x one year not consistenlty better with advair so self referred to pulmonary clinic 03/31/2015     History of Present Illness  03/31/2015 1st [facility] office visit/ Jachelle Fluty  On ACEi  Chief Complaint  Patient presents with  . Pulmonary Consult    Self referral. Pt c/o non-productive cough, wheezing, and SOB with exertion x 6 months. Pt states over the past weeks symptoms have become worse. Pt also states chest soreness when in bed.   indolent onset persistent daily symptoms of cough > sob worse at hs despite propping up higher with 2 pillows but does allow sleeping ok  And does not recur until stirs in am So severe at times she gets diffuse non-pleuritic ant cp rec Target blood pressure is less 130 /80 stop lotrel (amlodipine/benapril)  Start amlopidine 5 mg daily and valsartan 160 mg one daly in place of the lotrel Take zantac (ranitidine) after bfast and also after supper  Please remember to go to the  x-ray department downstairs for your tests - we will call you with the results when they are available.   Please schedule a follow up office visit in 4 weeks, sooner if needed to see our NP Tammy and bring all meds (no med calendar for now) If not all better change normodyne to bisoprolol Late add: stop advair  for now and just use xopenex prn     03/13/2018  Acute extended  ov/Melvena Vink re:  Chronic cough  Chief Complaint  Patient presents with  . Acute Visit    Cough and wheezing x 2 wks. Cough is non prod. She is using her ventolin 3 x daily on average. She feels her symptoms are worse in the am and at bedtime.   Dyspnea:  Walk mb uphill to house s stopping then has to sit down 100 ft  Due to sob Cough: at hs and wakes up  Her up at least once q night  to point of gagging s  vomiting  Sleeping: 30 degrees  SABA use: neb was helping   Also extremely dry mouth on anticholinergic for spastic bladder      Not  obvious day to day or daytime variability or assoc excess/ purulent sputum or mucus plugs or hemoptysis or cp or chest tightness, subjective wheeze or overt sinus or hb symptoms.    . Also denies any obvious fluctuation of symptoms with weather or environmental changes or other aggravating or alleviating factors except as outlined above   No unusual exposure hx or h/o childhood pna/ asthma or knowledge of premature birth.  Current Allergies, Complete Past Medical History, Past Surgical History, Family History, and Social History were reviewed in [employer] record.  ROS  The following are not active complaints unless bolded Hoarseness, sore throat, dysphagia, dental problems, itching, sneezing,  nasal congestion or discharge of excess mucus or purulent secretions, ear ache,   fever, chills, sweats, unintended wt loss or wt gain, classically pleuritic or exertional cp,  orthopnea pnd or arm/hand swelling  or leg swelling, presyncope, palpitations, abdominal pain, anorexia, nausea, vomiting, diarrhea  or change in bowel habits or change in bladder habits, change in stools or change in urine, dysuria, hematuria,  rash, arthralgias, visual complaints, headache,  numbness, weakness or ataxia or problems with walking or coordination,  change in mood or  memory.        Current Meds  Medication Sig  . acetaminophen (TYLENOL) 325 MG tablet Take 2 tablets (650 mg total) by mouth every 6 (six) hours as needed (or Fever >/= 101).  Marland Kitchen albuterol (PROVENTIL HFA;VENTOLIN HFA) 108 (90 Base) MCG/ACT inhaler Inhale 2 puffs into the lungs every 4 (four) hours as needed for wheezing or shortness of breath.  Marland Kitchen amLODipine (NORVASC) 10 MG tablet Take 1 tablet (10 mg total) by mouth daily.  Marland Kitchen aspirin 81 MG tablet Take 81 mg by mouth daily.  . Cholecalciferol  (VITAMIN D3) 5000 UNITS CAPS Take 1 capsule by mouth daily.  . Ferrous Gluconate (IRON) 240 (27 FE) MG TABS Take 1 tablet by mouth daily.  . furosemide (LASIX) 20 MG tablet 1 TABLET (20 MG) BY MOUTH DAILY  . JANUVIA 50 MG tablet 1 TABLET (50 MG) BY MOUTH DAILY  . levalbuterol (XOPENEX) 1.25 MG/3ML nebulizer solution Take 1.25 mg by nebulization every 4 (four) hours as needed for wheezing.  Marland Kitchen loratadine (CLARITIN) 10 MG tablet Take 10 mg by mouth daily.  . Omega-3 Fatty Acids (FISH OIL PO) Take 1 capsule by mouth daily.  . potassium chloride (K-DUR,KLOR-CON) 10 MEQ tablet Take 10 mEq by mouth daily.   . Trospium Chloride 60 MG CP24 Take 1 capsule by mouth daily.  . vitamin B-12 (CYANOCOBALAMIN) 500 MCG tablet Take 500 mcg by mouth daily.                    .       Objective:   Physical Exam  amb bf using rollator can barely stand up s assistace  03/13/2018        180   03/31/15 198 lb 9.6 oz (90.084 kg)  10/17/13 199 lb (90.266 kg)  07/23/13 195 lb (88.451 kg)     full dentures     min bilateral pedal edema   HEENT: nl   turbinates bilaterally.  Nl external ear canals without cough reflex - full dentures/ extremely dry oral mucosa    NECK :  without JVD/Nodes/TM/ nl carotid upstrokes bilaterally   LUNGS: no acc muscle use,  Nl contour chest which is clear to A and P bilaterally without cough on insp or exp maneuvers   CV:  RRR  no s3 or murmur or increase in P2, and no edema   ABD:  soft and nontender with nl inspiratory excursion in the supine position. No bruits or organomegaly appreciated, bowel sounds nl  MS: slow  gait/ ext warm without deformities, calf tenderness, cyanosis or clubbing No obvious joint restrictions   SKIN: warm and dry without lesions    NEURO:  alert, approp, nl sensorium with  no motor or cerebellar deficits apparent.     CXR PA and Lateral:   03/13/2018 :    I personally reviewed images and agree with radiology impression as follows:     1. No acute infiltrate or edema. Tiny pleural effusion or thickening. 2. Large hiatal hernia 3. Cardiomegaly    Labs ordered 03/13/2018  Allergy profile            Assessment & Plan:

## 2018-03-14 ENCOUNTER — Encounter: Payer: Self-pay | Admitting: Internal Medicine

## 2018-03-14 LAB — RESPIRATORY ALLERGY PROFILE REGION II ~~LOC~~
Allergen, A. alternata, m6: <0.1 kU/L
Allergen, Comm Silver Birch, t9: <0.1 kU/L
Allergen, Cottonwood, t14: <0.1 kU/L
Allergen, Mouse Urine Protein, e78: <0.1 kU/L
Box Elder IgE: <0.1 kU/L
CLASS: 0
CLASS: 0
CLASS: 0
CLASS: 0
CLASS: 0
CLASS: 0
CLASS: 0
CLASS: 0
CLASS: 0
Class: 0
Class: 0
Class: 0
Class: 0
Class: 0
Class: 0
Class: 0
Class: 0
Class: 0
Class: 0
Class: 0
Class: 0
Class: 0
Class: 0
Class: 0
Dog Dander: <0.1 kU/L
Elm IgE: <0.1 kU/L
IGE (IMMUNOGLOBULIN E), SERUM: 19 kU/L (ref <=114)
Pecan/Hickory Tree IgE: <0.1 kU/L
Sheep Sorrel IgE: <0.1 kU/L
Timothy Grass: <0.1 kU/L

## 2018-03-14 LAB — INTERPRETATION:

## 2018-03-14 NOTE — Progress Notes (Signed)
Spoke with pt and notified of results per Dr. Wert. Pt verbalized understanding and denied any questions. 

## 2018-03-14 NOTE — Assessment & Plan Note (Signed)
Allergy profile 03/13/2018 >  Eos 0.1 /  IgE   19 / RAST neg    Upper airway cough syndrome (previously labeled PNDS),  is so named because it's frequently impossible to sort out how much is  CR/sinusitis with freq throat clearing (which can be related to primary GERD)   vs  causing  secondary (" extra esophageal")  GERD from wide swings in gastric pressure that occur with throat clearing, often  promoting self use of mint and menthol lozenges that reduce the lower esophageal sphincter tone and exacerbate the problem further in a cyclical fashion.   These are the same pts (now being labeled as having "irritable larynx syndrome" by some cough centers) who not infrequently have a history of having failed to tolerate ace inhibitors,  dry powder inhalers or biphosphonates or report having atypical/extraesophageal reflux symptoms that don't respond to standard doses of PPI (and note she has large [place])  and are easily confused as having aecopd or asthma flares by even experienced allergists/ pulmonologists (myself included).    rec start with d/c fish oil and anticholinergics on a trial basis  and rx max acid suppression/ diet/ use of hard rock candy and regroup in 2 weeks with all meds in hand using a trust but verify approach to confirm accurate Medication  Reconciliation The principal here is that until we are certain that the  patients are doing what we've asked, it makes no sense to ask them to do more.     I had an extended discussion with the patient reviewing all relevant studies completed to date and  lasting 25 minutes of a 40  minute acute office visit to re-establish  Re new severe non-specific but potentially very serious refractory respiratory symptoms of uncertain and potentially multiple  etiologies.   Each maintenance medication was reviewed in detail including most importantly the difference between maintenance and prns and under what circumstances the prns are to be triggered using an  action plan format that is not reflected in the computer generated alphabetically organized AVS.    Please see AVS for specific instructions unique to this office visit that I personally wrote and verbalized to the the pt in detail and then reviewed with pt  by my nurse highlighting any changes in therapy/plan of care  recommended at today's visit.

## 2018-03-19 DIAGNOSIS — R351 Nocturia: Secondary | ICD-10-CM | POA: Diagnosis not present

## 2018-03-19 DIAGNOSIS — N3281 Overactive bladder: Secondary | ICD-10-CM | POA: Diagnosis not present

## 2018-03-25 ENCOUNTER — Ambulatory Visit: Payer: Medicare Other | Admitting: Orthotics

## 2018-03-25 DIAGNOSIS — E1140 Type 2 diabetes mellitus with diabetic neuropathy, unspecified: Secondary | ICD-10-CM

## 2018-03-25 DIAGNOSIS — L84 Corns and callosities: Secondary | ICD-10-CM

## 2018-03-25 DIAGNOSIS — E1151 Type 2 diabetes mellitus with diabetic peripheral angiopathy without gangrene: Secondary | ICD-10-CM

## 2018-03-25 NOTE — Progress Notes (Signed)
Patient had appointment today for definitive and final diabetic shoe fitting and delivery.  Patient was seen by Betha, C.Ped, [facility].   The inserts fit well and accomplished full contact with the plantar surface of the foot bilateral; the shoes fit well and offered forefoot freedom, no noticible heel slippage, and good toe clearance w/ the insert in place.  Patient was advised to monitor of any skin irritation, breakdown.  Patient was satisfied with fit and function. 

## 2018-03-27 ENCOUNTER — Ambulatory Visit (INDEPENDENT_AMBULATORY_CARE_PROVIDER_SITE_OTHER): Payer: Medicare Other | Admitting: Nurse Practitioner

## 2018-03-27 ENCOUNTER — Encounter: Payer: Self-pay | Admitting: Nurse Practitioner

## 2018-03-27 VITALS — BP 128/76 | HR 87 | Ht 64.0 in | Wt 176.2 lb

## 2018-03-27 DIAGNOSIS — R058 Other specified cough: Secondary | ICD-10-CM

## 2018-03-27 DIAGNOSIS — R05 Cough: Secondary | ICD-10-CM | POA: Diagnosis not present

## 2018-03-27 NOTE — Progress Notes (Signed)
Chart and office note reviewed in detail  > agree with a/p as outlined    

## 2018-03-27 NOTE — Assessment & Plan Note (Signed)
Patient Instructions  Please stop omeprazole Pantoprazole (protonix) 40 mg   Take  30-60 min before first meal of the day and Pepcid (famotidine)  20 mg one @  bedtime until return to office - this is the best way to tell whether stomach acid is contributing to your problem.    Stop the fish oil  And cough drops and trospium   Only use your albuterol as a rescue medication to be used if you can't catch your breath by resting or doing a relaxed purse lip breathing pattern.  - The less you use it, the better it will work when you need it. - Ok to use up to  every 4 hours if you must but call for immediate appointment if use goes up over your usual need      GERD (REFLUX)  is an extremely common cause of respiratory symptoms just like yours , many times with no obvious heartburn at all.    It can be treated with medication, but also with lifestyle changes including elevation of the head of your bed (ideally with 6 inch  bed blocks),  Smoking cessation, avoidance of late meals, excessive alcohol, and avoid fatty foods, chocolate, peppermint, colas, red wine, and acidic juices such as orange juice.  NO MINT OR MENTHOL PRODUCTS SO NO COUGH DROPS   USE SUGARLESS CANDY INSTEAD (Jolley ranchers or Stover's or Life Savers) or even ice chips will also do - the key is to swallow to prevent all throat clearing. NO OIL BASED VITAMINS - use powdered substitutes.    Take delsym two tsp every 12 hours and supplement if needed  to suppress the urge to cough. Swallowing water and/or using ice chips/non mint and menthol containing candies (such as lifesavers or sugarless jolly ranchers) are also effective.  You should rest your voice and avoid activities that you know make you cough.    Please schedule a follow up office visit in  4 weeks, sooner if needed

## 2018-03-27 NOTE — Patient Instructions (Addendum)
Please stop omeprazole Pantoprazole (protonix) 40 mg   Take  30-60 min before first meal of the day and Pepcid (famotidine)  20 mg one @  bedtime until return to office - this is the best way to tell whether stomach acid is contributing to your problem.    Stop the fish oil  And cough drops and trospium   Only use your albuterol as a rescue medication to be used if you can't catch your breath by resting or doing a relaxed purse lip breathing pattern.  - The less you use it, the better it will work when you need it. - Ok to use up to  every 4 hours if you must but call for immediate appointment if use goes up over your usual need      GERD (REFLUX)  is an extremely common cause of respiratory symptoms just like yours , many times with no obvious heartburn at all.    It can be treated with medication, but also with lifestyle changes including elevation of the head of your bed (ideally with 6 inch  bed blocks),  Smoking cessation, avoidance of late meals, excessive alcohol, and avoid fatty foods, chocolate, peppermint, colas, red wine, and acidic juices such as orange juice.  NO MINT OR MENTHOL PRODUCTS SO NO COUGH DROPS   USE SUGARLESS CANDY INSTEAD (Jolley ranchers or Stover's or Life Savers) or even ice chips will also do - the key is to swallow to prevent all throat clearing. NO OIL BASED VITAMINS - use powdered substitutes.    Take delsym two tsp every 12 hours and supplement if needed to suppress the urge to cough. Swallowing water and/or using ice chips/non mint and menthol containing candies (such as lifesavers or sugarless jolly ranchers) are also effective.  You should rest your voice and avoid activities that you know make you cough.    Please schedule a follow up office visit in 4 weeks, sooner if needed

## 2018-03-27 NOTE — Progress Notes (Signed)
@Patient  ID: Heidi Dominguez, female    DOB: February 22, 1921, 84 y.o.   MRN: [id number]  Chief Complaint  Patient presents with  . Follow-up    Medication review    Referring provider: Lajean Manes, MD   69 year ols female never smoker followed by Dr. Melvyn Novas for upper airway cough syndrome.   HPI Patient presents today for follow up on upper airway cough syndrome. Patient states that she has been doing well. She has brought her medications in today to review. She has been compliant with medications with the exception of her Protonix and Pepcid as Dr. Melvyn Novas advised last visit. She has been taking both medications in the morning plus taking omeprazole at night. She was not able to tolerate Tylenol #3 and I advised her that it was fine not to take it. Denies any fever. States cough is much improved.   Recent [facility] Encounters:  OV 03-13-18 PLAN: Stop the fish oil  And cough drops and trospium   Only use your albuterol as a rescue medication to be used if you can't catch your breath by resting or doing a relaxed purse lip breathing pattern.  - The less you use it, the better it will work when you need it. - Ok to use up to  every 4 hours if you must but call for immediate appointment if use goes up over your usual need   Pantoprazole (protonix) 40 mg   Take  30-60 min before first meal of the day and Pepcid (famotidine)  20 mg one @  bedtime until return to office - this is the best way to tell whether stomach acid is contributing to your problem.     GERD (REFLUX)  is an extremely common cause of respiratory symptoms just like yours , many times with no obvious heartburn at all.    It can be treated with medication, but also with lifestyle changes including elevation of the head of your bed (ideally with 6 inch  bed blocks),  Smoking cessation, avoidance of late meals, excessive alcohol, and avoid fatty foods, chocolate, peppermint, colas, red wine, and acidic juices such as  orange juice.  NO MINT OR MENTHOL PRODUCTS SO NO COUGH DROPS   USE SUGARLESS CANDY INSTEAD (Jolley ranchers or Stover's or Life Savers) or even ice chips will also do - the key is to swallow to prevent all throat clearing. NO OIL BASED VITAMINS - use powdered substitutes.    Take delsym two tsp every 12 hours and supplement if needed with  Tylenol #3  mg up to 1 every 4 hours to suppress the urge to cough. Swallowing water and/or using ice chips/non mint and menthol containing candies (such as lifesavers or sugarless jolly ranchers) are also effective.  You should rest your voice and avoid activities that you know make you cough.  Once you have eliminated the cough for 3 straight days try reducing the tylenol #3   first,  then the delsym as tolerated.     Please remember to go to the lab and x-ray department downstairs in the basement  for your tests - we will call you with the results when they are available.   Please schedule a follow up office visit in 2  weeks, sooner if needed  with all medications /inhalers/ solutions in hand so we can verify exactly what you are taking. This includes all medications from all doctors and over the counters        Allergies  Allergen Reactions  . Catapres [Clonidine Hcl] Other (See Comments)    Unknown. Unknown.  Lorrin Goodell [Verapamil Hcl] Other (See Comments)    Unknown.  . Other Other (See Comments)    Any type of narcotics Feels "loopy" Any type of narcotics Feels "loopy"  . Sulfa Antibiotics Itching  . Sulfamethoxazole Diarrhea  . Verapamil Diarrhea and Other (See Comments)  . Sulfasalazine Itching    Immunization History  Administered Date(s) Administered  . PPD Test 04/13/2015    Past Medical History:  Diagnosis Date  . Chronic diastolic CHF (congestive heart failure) ([place])   . Complication of anesthesia    I have a hard time waking up"  . Degenerative joint disease of shoulder region   . Diabetes mellitus   . GERD  (gastroesophageal reflux disease)   . High cholesterol   . HOH (hard of hearing)   . Hypertension   . Osteoarthritis   . Pneumonia     Tobacco History: Social History   Tobacco Use  Smoking Status Never Smoker  Smokeless Tobacco Never Used   Counseling given: NEVER SMOKER   Outpatient Encounter Medications as of 03/27/2018  Medication Sig  . albuterol (PROVENTIL HFA;VENTOLIN HFA) 108 (90 Base) MCG/ACT inhaler Inhale into the lungs every 6 (six) hours as needed for wheezing or shortness of breath.  Marland Kitchen albuterol (PROVENTIL) (2.5 MG/3ML) 0.083% nebulizer solution Take 3 mLs (2.5 mg total) by nebulization every 6 (six) hours as needed for wheezing or shortness of breath.  Marland Kitchen amLODipine (NORVASC) 10 MG tablet Take 1 tablet (10 mg total) by mouth daily.  Marland Kitchen aspirin 81 MG tablet Take 81 mg by mouth daily.  . Ferrous Gluconate (IRON) 240 (27 FE) MG TABS Take 1 tablet by mouth daily.  . furosemide (LASIX) 40 MG tablet Take 40 mg by mouth.  Marland Kitchen JANUVIA 50 MG tablet 1 TABLET (50 MG) BY MOUTH DAILY  . loratadine (CLARITIN) 10 MG tablet Take 10 mg by mouth daily.  . pantoprazole (PROTONIX) 40 MG tablet Take 1 tablet (40 mg total) by mouth daily. Take 30-60 min before first meal of the day  . potassium chloride (K-DUR,KLOR-CON) 10 MEQ tablet Take 10 mEq by mouth daily.   . vitamin B-12 (CYANOCOBALAMIN) 500 MCG tablet Take 500 mcg by mouth daily.  Marland Kitchen omeprazole (PRILOSEC) 40 MG capsule Take 40 mg by mouth daily.  . [DISCONTINUED] acetaminophen (TYLENOL) 325 MG tablet Take 2 tablets (650 mg total) by mouth every 6 (six) hours as needed (or Fever >/= 101). (Patient not taking: Reported on 03/27/2018)  . [DISCONTINUED] Cholecalciferol (VITAMIN D3) 5000 UNITS CAPS Take 1 capsule by mouth daily.  . [DISCONTINUED] famotidine (PEPCID) 20 MG tablet One at bedtime (Patient not taking: Reported on 03/27/2018)  . [DISCONTINUED] furosemide (LASIX) 20 MG tablet 1 TABLET (20 MG) BY MOUTH DAILY   No  facility-administered encounter medications on file as of 03/27/2018.      Review of Systems  Review of Systems  Constitutional: Negative.   HENT: Negative.   Respiratory: Positive for cough and shortness of breath.   Cardiovascular: Negative.   Gastrointestinal: Negative.   Allergic/Immunologic: Negative.   Neurological: Negative.   Psychiatric/Behavioral: Negative.        Physical Exam  BP 128/76 (BP Location: Left Arm, Patient Position: Sitting, Cuff Size: Normal)   Pulse 87   Ht 5\' 4"  (1.626 m)   Wt 176 lb 3.2 oz (79.9 kg)   SpO2 95%   BMI 30.24 kg/m   Wt  Readings from Last 5 Encounters:  03/27/18 176 lb 3.2 oz (79.9 kg)  03/13/18 180 lb (81.6 kg)  01/13/18 192 lb (87.1 kg)  03/23/17 192 lb (87.1 kg)  05/12/16 185 lb (83.9 kg)     Physical Exam  Constitutional: She is oriented to person, place, and time. She appears well-developed and well-nourished. No distress.  Cardiovascular: Normal rate and regular rhythm.  Pulmonary/Chest: Effort normal and breath sounds normal.  Neurological: She is alert and oriented to person, place, and time.  Psychiatric: She has a normal mood and affect.  Nursing note and vitals reviewed.    Lab Results:  CBC    Component Value Date/Time   WBC 9.8 03/13/2018 1113   RBC 4.64 03/13/2018 1113   HGB 14.4 03/13/2018 1113   HCT 43.5 03/13/2018 1113   PLT 159.0 03/13/2018 1113   MCV 93.7 03/13/2018 1113   MCH 29.8 03/23/2017 1804   MCHC 33.1 03/13/2018 1113   RDW 14.0 03/13/2018 1113   LYMPHSABS 1.5 03/13/2018 1113   MONOABS 0.9 03/13/2018 1113   EOSABS 0.1 03/13/2018 1113   BASOSABS 0.1 03/13/2018 1113    Imaging: Dg Chest 2 View  Result Date: 03/13/2018 CLINICAL DATA:  Chronic cough and congestion EXAM: CHEST - 2 VIEW COMPARISON:  03/30/2016 lumbar radiograph 01/13/2018 FINDINGS: Bilateral shoulder replacements. Hyperinflation. Tiny pleural effusion or thickening on lateral view. Cardiomegaly with aortic  atherosclerosis. No focal airspace disease. No pneumothorax. Large hiatal hernia. Kyphosis at the thoracolumbar junction with moderate severe compression deformities at the lower thoracic and upper lumbar spine. IMPRESSION: 1. No acute infiltrate or edema. Tiny pleural effusion or thickening. 2. Large hiatal hernia 3. Cardiomegaly Electronically Signed   By: Donavan Foil M.D.   On: 03/13/2018 14:37     Assessment & Plan:   Upper airway cough syndrome Patient Instructions  Please stop omeprazole Pantoprazole (protonix) 40 mg   Take  30-60 min before first meal of the day and Pepcid (famotidine)  20 mg one @  bedtime until return to office - this is the best way to tell whether stomach acid is contributing to your problem.    Stop the fish oil  And cough drops and trospium   Only use your albuterol as a rescue medication to be used if you can't catch your breath by resting or doing a relaxed purse lip breathing pattern.  - The less you use it, the better it will work when you need it. - Ok to use up to  every 4 hours if you must but call for immediate appointment if use goes up over your usual need      GERD (REFLUX)  is an extremely common cause of respiratory symptoms just like yours , many times with no obvious heartburn at all.    It can be treated with medication, but also with lifestyle changes including elevation of the head of your bed (ideally with 6 inch  bed blocks),  Smoking cessation, avoidance of late meals, excessive alcohol, and avoid fatty foods, chocolate, peppermint, colas, red wine, and acidic juices such as orange juice.  NO MINT OR MENTHOL PRODUCTS SO NO COUGH DROPS   USE SUGARLESS CANDY INSTEAD (Jolley ranchers or Stover's or Life Savers) or even ice chips will also do - the key is to swallow to prevent all throat clearing. NO OIL BASED VITAMINS - use powdered substitutes.    Take delsym two tsp every 12 hours and supplement if needed  to suppress the urge to  cough. Swallowing water and/or using ice chips/non mint and menthol containing candies (such as lifesavers or sugarless jolly ranchers) are also effective.  You should rest your voice and avoid activities that you know make you cough.    Please schedule a follow up office visit in  4 weeks, sooner if needed        Fenton Foy, NP 03/27/2018

## 2018-04-02 DIAGNOSIS — E113593 Type 2 diabetes mellitus with proliferative diabetic retinopathy without macular edema, bilateral: Secondary | ICD-10-CM | POA: Diagnosis not present

## 2018-04-02 DIAGNOSIS — H353211 Exudative age-related macular degeneration, right eye, with active choroidal neovascularization: Secondary | ICD-10-CM | POA: Diagnosis not present

## 2018-04-02 DIAGNOSIS — H353124 Nonexudative age-related macular degeneration, left eye, advanced atrophic with subfoveal involvement: Secondary | ICD-10-CM | POA: Diagnosis not present

## 2018-04-25 ENCOUNTER — Encounter: Payer: Self-pay | Admitting: Internal Medicine

## 2018-04-25 ENCOUNTER — Ambulatory Visit (INDEPENDENT_AMBULATORY_CARE_PROVIDER_SITE_OTHER): Payer: Medicare Other | Admitting: Internal Medicine

## 2018-04-25 VITALS — BP 124/80 | HR 81 | Ht 64.0 in | Wt 175.6 lb

## 2018-04-25 DIAGNOSIS — R05 Cough: Secondary | ICD-10-CM | POA: Diagnosis not present

## 2018-04-25 DIAGNOSIS — R058 Other specified cough: Secondary | ICD-10-CM

## 2018-04-25 NOTE — Patient Instructions (Signed)
Only use your albuterol as a rescue medication to be used if you can't catch your breath by resting or doing a relaxed purse lip breathing pattern.  - The less you use it, the better it will work when you need it. - Ok to use up to 2 puffs  every 4 hours if you must but call for immediate appointment if use goes up over your usual need - Don't leave home without it !!  (think of it like the spare tire for your car)    Continue Pantoprazole (protonix) 40 mg   Take  30-60 min before first meal of the day and Pepcid (famotidine)  20 mg one @  bedtime  Indefinitely   Pulmonary follow up is as needed

## 2018-04-25 NOTE — Progress Notes (Signed)
Subjective:    Patient ID: Heidi Dominguez, female    DOB: 16-Jun-1921,    MRN: [id number]     Brief patient profile:  89 yobf never smoker never asthma some sinus issues in fall > spring new onset wheezing x one year not consistenlty better with advair so self referred to pulmonary clinic 03/31/2015     History of Present Illness  03/31/2015 1st [facility] office visit/ Fransisca Shawn  On ACEi  Chief Complaint  Patient presents with  . Pulmonary Consult    Self referral. Pt c/o non-productive cough, wheezing, and SOB with exertion x 6 months. Pt states over the past weeks symptoms have become worse. Pt also states chest soreness when in bed.   indolent onset persistent daily symptoms of cough > sob worse at hs despite propping up higher with 2 pillows but does allow sleeping ok  And does not recur until stirs in am So severe at times she gets diffuse non-pleuritic ant cp rec [employer] blood pressure is less 130 /80 stop lotrel (amlodipine/benapril)  Start amlopidine 5 mg daily and valsartan 160 mg one daly in place of the lotrel Take zantac (ranitidine) after bfast and also after supper  Please remember to go to the  x-ray department downstairs for your tests - we will call you with the results when they are available.   Please schedule a follow up office visit in 4 weeks, sooner if needed to see our NP Tammy and bring all meds (no med calendar for now) If not all better change normodyne to bisoprolol Late add: stop advair  for now and just use xopenex prn     03/13/2018  Acute extended  ov/Jacorian Golaszewski re:  Chronic cough  Chief Complaint  Patient presents with  . Acute Visit    Cough and wheezing x 2 wks. Cough is non prod. She is using her ventolin 3 x daily on average. She feels her symptoms are worse in the am and at bedtime.   Dyspnea:  Walk mb uphill to house s stopping then has to sit down 100 ft  Due to sob Cough: at hs and wakes up  Her up at least once q night  to point of gagging s  vomiting  Sleeping: 30 degrees  SABA use: neb was helping  Also extremely dry mouth on anticholinergic for spastic bladder   rec Stop the fish oil  And cough drops and trospium  Only use your albuterol as a rescue medication   Pantoprazole (protonix) 40 mg   Take  30-60 min before first meal of the day and Pepcid (famotidine)  20 mg one @  bedtime until return to office - this is the best way to tell whether stomach acid is contributing to your problem.   GERD diet   Take delsym two tsp every 12 hours and supplement if needed with  Tylenol #3  mg up to 1 every 4 hours  Please remember to go to the lab and x-ray department downstairs in the basement  for your tests - we will call you with the results when they are available.   03/27/18  NP Please stop omeprazole Pantoprazole (protonix) 40 mg   Take  30-60 min before first meal of the day and Pepcid (famotidine)  20 mg one @  bedtime until return to office - this is the best way to tell whether stomach acid is contributing to your problem.   Stop the fish oil  And cough drops  and trospium  Only use your albuterol as a rescue medication   GERD (diet)  Take delsym two tsp every 12 hours and supplement if needed to suppress the urge to cough.      04/25/2018  f/u ov/Nima Kemppainen re: cough - mostly resolved Chief Complaint  Patient presents with  . Follow-up    Still coughing occ. She does not have to use her rescue inhaler.  She has neb med- albuterol, but no neb machine.    Dyspnea:  No change  Cough: gone/ no more noct gagging Sleeping: 30 degrees SABA use: occ uses but very ineffective 02: none    No obvious day to day or daytime variability or assoc excess/ purulent sputum or mucus plugs or hemoptysis or cp or chest tightness, subjective wheeze or overt sinus or hb symptoms.   Sleeping as above  without nocturnal  or early am exacerbation  of respiratory  c/o's or need for noct saba. Also denies any obvious fluctuation of symptoms with  weather or environmental changes or other aggravating or alleviating factors except as outlined above   No unusual exposure hx or h/o childhood pna/ asthma or knowledge of premature birth.  Current Allergies, Complete Past Medical History, Past Surgical History, Family History, and Social History were reviewed in [employer] record.  ROS  The following are not active complaints unless bolded Hoarseness, sore throat, dysphagia, dental problems, itching, sneezing,  nasal congestion or discharge of excess mucus or purulent secretions, ear ache,   fever, chills, sweats, unintended wt loss or wt gain, classically pleuritic or exertional cp,  orthopnea pnd or arm/hand swelling  or leg swelling, presyncope, palpitations, abdominal pain, anorexia, nausea, vomiting, diarrhea  or change in bowel habits or change in bladder habits, change in stools or change in urine, dysuria, hematuria,  rash, arthralgias, visual complaints, headache, numbness, weakness or ataxia or problems with walking or coordination,  change in mood or  memory.        Current Meds  Medication Sig  . albuterol (PROVENTIL HFA;VENTOLIN HFA) 108 (90 Base) MCG/ACT inhaler Inhale into the lungs every 6 (six) hours as needed for wheezing or shortness of breath.  Marland Kitchen albuterol (PROVENTIL) (2.5 MG/3ML) 0.083% nebulizer solution Take 3 mLs (2.5 mg total) by nebulization every 6 (six) hours as needed for wheezing or shortness of breath.  Marland Kitchen amLODipine (NORVASC) 10 MG tablet Take 1 tablet (10 mg total) by mouth daily.  Marland Kitchen aspirin 81 MG tablet Take 81 mg by mouth daily.  . famotidine (PEPCID) 20 MG tablet Take 20 mg by mouth at bedtime.  . Ferrous Gluconate (IRON) 240 (27 FE) MG TABS Take 1 tablet by mouth daily.  . furosemide (LASIX) 40 MG tablet Take 40 mg by mouth.  Marland Kitchen JANUVIA 50 MG tablet 1 TABLET (50 MG) BY MOUTH DAILY  . loratadine (CLARITIN) 10 MG tablet Take 10 mg by mouth daily.  . Multiple Vitamins-Minerals  (PRESERVISION AREDS) TABS Take 1 tablet by mouth daily.  . pantoprazole (PROTONIX) 40 MG tablet Take 1 tablet (40 mg total) by mouth daily. Take 30-60 min before first meal of the day  . potassium chloride (K-DUR,KLOR-CON) 10 MEQ tablet Take 10 mEq by mouth daily.   . vitamin B-12 (CYANOCOBALAMIN) 500 MCG tablet Take 500 mcg by mouth daily.          Objective:   Physical Exam     amb bf using rollator   04/25/2018        175  03/13/2018        180   03/31/15 198 lb 9.6 oz (90.084 kg)  10/17/13 199 lb (90.266 kg)  07/23/13 195 lb (88.451 kg)     full dentures          HEENT: nl   turbinates bilaterally, and oropharynx. Nl external ear canals without cough reflex - full dentures    NECK :  without JVD/Nodes/TM/ nl carotid upstrokes bilaterally   LUNGS: no acc muscle use,  Nl contour chest which is clear to A and P bilaterally without cough on insp or exp maneuvers   CV:  RRR  no s3 or murmur or increase in P2, and trace pitting lower ext L > R   ABD:  soft and nontender with nl inspiratory excursion in the supine position. No bruits or organomegaly appreciated, bowel sounds nl  MS:   Walking stooped over with rollator/ ext warm without deformities, calf tenderness, cyanosis or clubbing No obvious joint restrictions   SKIN: warm and dry without lesions    NEURO:  alert, approp, nl sensorium with  no motor or cerebellar deficits apparent.        CXR PA and Lateral:   03/13/2018 :    I personally reviewed images and agree with radiology impression as follows:    1. No acute infiltrate or edema. Tiny pleural effusion or thickening. 2. Large hiatal hernia 3. Cardiomegaly              Assessment & Plan:

## 2018-04-29 ENCOUNTER — Encounter: Payer: Self-pay | Admitting: Internal Medicine

## 2018-04-29 DIAGNOSIS — H6122 Impacted cerumen, left ear: Secondary | ICD-10-CM | POA: Diagnosis not present

## 2018-04-29 NOTE — Assessment & Plan Note (Addendum)
Allergy profile 03/13/2018 >  Eos 0.1 /  IgE   19 / RAST neg   Cough has resolved off acei and on gerd rx   Upper airway cough syndrome (previously labeled PNDS),  is so named because it's frequently impossible to sort out how much is  CR/sinusitis with freq throat clearing (which can be related to primary GERD)   vs  causing  secondary (" extra esophageal")  GERD from wide swings in gastric pressure that occur with throat clearing, often  promoting self use of mint and menthol lozenges that reduce the lower esophageal sphincter tone and exacerbate the problem further in a cyclical fashion.   These are the same pts (now being labeled as having "irritable larynx syndrome" by some cough centers) who not infrequently have a history of having failed to tolerate ace inhibitors,  dry powder inhalers or biphosphonates or report having atypical/extraesophageal reflux symptoms that don't respond to standard doses of PPI  and are easily confused as having aecopd or asthma flares by even experienced allergists/ pulmonologists (myself included).    I really doubt she has primary airways dz (asthma) but fine to use saba prn.  - The proper method of use, as well as anticipated side effects, of a metered-dose inhaler are discussed and demonstrated to the patient. Improved effectiveness after extensive coaching during this visit to a level of approximately 75 % from a baseline of 50 % so she doesn't really need a neb as she doesn't really appear to have much asthma and is capable of using the saba hfa form if needed   Reviewed the rule of 2's If your breathing worsens or you need to use your rescue inhaler more than twice weekly or wake up more than twice a month with any respiratory symptoms or require more than two rescue inhalers per year, we need to see you right away because this means we're not controlling the underlying problem (inflammation) adequately.  Rescue inhalers (albuterol) do not control  inflammation and overuse can lead to unnecessary and costly consequences.  They can make you feel better temporarily but eventually they will quit working effectively much as sleep aids lead to more insomnia if used regularly.    Also reviewed reports re ppi adverse effects - seems to be tolerating once daily ppi at age 84 so far so good so no need to change now but defer all f/u to PCP.    Each maintenance medication was reviewed in detail including most importantly the difference between maintenance and as needed and under what circumstances the prns are to be used.  Please see AVS for specific  Instructions which are unique to this visit and I personally typed out  which were reviewed in detail in writing with the patient and a copy provided.  t  F/u is prn   > 50% of this final summary ov used for counseling/ med review   See device teaching which extended face to face time for this visit

## 2018-05-21 DIAGNOSIS — H353211 Exudative age-related macular degeneration, right eye, with active choroidal neovascularization: Secondary | ICD-10-CM | POA: Diagnosis not present

## 2018-05-24 ENCOUNTER — Ambulatory Visit (INDEPENDENT_AMBULATORY_CARE_PROVIDER_SITE_OTHER): Payer: Medicare Other | Admitting: Podiatry

## 2018-05-24 ENCOUNTER — Encounter: Payer: Self-pay | Admitting: Podiatry

## 2018-05-24 DIAGNOSIS — E1151 Type 2 diabetes mellitus with diabetic peripheral angiopathy without gangrene: Secondary | ICD-10-CM

## 2018-05-24 DIAGNOSIS — L84 Corns and callosities: Secondary | ICD-10-CM | POA: Diagnosis not present

## 2018-05-24 DIAGNOSIS — M79675 Pain in left toe(s): Secondary | ICD-10-CM

## 2018-05-24 DIAGNOSIS — M79674 Pain in right toe(s): Secondary | ICD-10-CM | POA: Diagnosis not present

## 2018-05-24 DIAGNOSIS — B351 Tinea unguium: Secondary | ICD-10-CM | POA: Diagnosis not present

## 2018-05-25 NOTE — Progress Notes (Signed)
Subjective: Heidi Dominguez presents today with diabetes, diabetic neuropathy and cc of painful, discolored, thick toenails which interfere with daily activities and routine tasks. Pain is aggravated when wearing enclosed shoe gear. Pain is getting progressively worse and relieved with periodic professional debridement.  Objective: Vascular Examination: Capillary refill time <3 seconds x 10 digits Dorsalis pedis pulses 1/4 b/l Posterior tibial pulses absent b/l No digital hair x 10 digits Skin temperature warm to cool b/l Trace edema present b/l LE +Varicosities b/l LE  Dermatological Examination: Skin thin, shiny and atrophic b/l Toenails 1-5 b/l discolored, thick, dystrophic with subungual debris and pain with palpation to nailbeds due to thickness of nails.  Hyperkeratotic lesion submetatarsal head 1 left foot extending medially with subdermal hemorrhage. No erythema, no edema, no flocculence, no drainage.   Musculoskeletal: Muscle strength 5/5 to all LE muscle groups HAV with bunion b/l Hammertoe deformity 2-5 b/l Pes planus b/l feet  DME: Utilizes rollator walker  Neurological: Sensation intact with 10 gram monofilament. Vibratory sensation diminished  Assessment: 1. Painful onychomycosis toenails 1-5 b/l 2. Preulcerative callus submetatarsal head 1 left foot 3. NIDDM with Peripheral arterial disease 4. Diabetic neuropathy  Plan: 1. Discuss diabetic foot care principles. Literature dispensed. 2. Toenails 1-5 b/l were debrided in length and girth without iatrogenic bleeding. 3. Preulcerative callus debrided submet head 1 left foot; applied felt aperture pad and silicone bunion shield after treatment 4. Patient to continue soft, supportive shoe gear 5. Patient to report any pedal injuries to medical professional  6. Follow up 9 weeks.  7. Patient/POA to call should there be a concern in the interim.

## 2018-06-07 ENCOUNTER — Other Ambulatory Visit: Payer: Self-pay | Admitting: Internal Medicine

## 2018-06-07 DIAGNOSIS — R05 Cough: Secondary | ICD-10-CM

## 2018-06-07 DIAGNOSIS — R058 Other specified cough: Secondary | ICD-10-CM

## 2018-06-12 ENCOUNTER — Ambulatory Visit: Payer: Medicare Other | Attending: Retina Specialist | Admitting: Occupational Therapy

## 2018-06-12 DIAGNOSIS — H53413 Scotoma involving central area, bilateral: Secondary | ICD-10-CM | POA: Insufficient documentation

## 2018-06-13 NOTE — Therapy (Signed)
[facility] [address] [place], [place], [address] Phone: [phone]   Fax:  [phone]  Occupational Therapy Evaluation  Patient Details  Name: Heidi Dominguez MRN: [id number] Date of Birth: [birth year] Referring Provider (OT): Dr. Nash Shearer   Encounter Date: 06/12/2018  OT End of Session - 06/12/18 1624    Visit Number  1    Number of Visits  1    Date for OT Re-Evaluation  --   n/a   Authorization Type  Medicare    OT Start Time  1455    OT Stop Time  1533    OT Time Calculation (min)  38 min       Past Medical History:  Diagnosis Date  . Chronic diastolic CHF (congestive heart failure) (HCC)   . Complication of anesthesia    I have a hard time waking up"  . Degenerative joint disease of shoulder region   . Diabetes mellitus   . GERD (gastroesophageal reflux disease)   . High cholesterol   . HOH (hard of hearing)   . Hypertension   . Osteoarthritis   . Pneumonia     Past Surgical History:  Procedure Laterality Date  . ABDOMINAL HYSTERECTOMY    . BACK SURGERY    . cataracts     bilateral  . KNEE SURGERY    . LAPAROSCOPIC CHOLECYSTECTOMY SINGLE SITE WITH INTRAOPERATIVE CHOLANGIOGRAM N/A 04/11/2015   Procedure: LAPAROSCOPIC LYSIS OF ADHESIONS, LAPAROSCOPIC CHOLECYSTECTOMY WITH INTRAOPERATIVE CHOLANGIOGRAM;  Surgeon: Karie Soda, MD;  Location: [facility];  Service: General;  Laterality: N/A;  . SHOULDER SURGERY      There were no vitals filed for this visit.  Subjective Assessment - 06/12/18 1459    Subjective   Pt reports she wants to read easier    Pertinent History  see snapshot    Patient Stated Goals  to read easier    Currently in Pain?  No/denies        [facility] OT Assessment - 06/13/18 0001      Assessment   Medical Diagnosis  macular degeneration, diabetic retinopathy    Referring Provider (OT)  Dr. Nash Shearer    Onset Date/Surgical Date  04/02/18      Precautions   Precautions  Fall      Balance Screen   Has the patient fallen in the past 6 months  Yes    How many times?  2    Has the patient had a decrease in activity level because of a fear of falling?   No    Is the patient reluctant to leave their home because of a fear of falling?   No      Home  Environment   Family/patient expects to be discharged to:  Private residence    Home Access  Ramped entrance    Home Layout  One level    Lives With  Family      Prior Function   Level of Independence  Needs assistance with ADLs;Independent with household mobility with device    Vocation  Retired    Leisure  works puzzles, cooks       ADL   ADL comments  Pt reports she is able to dress herself, but she needs A with bathing      IADL   Shopping  Needs to be accompanied on any shopping trip    Light Housekeeping  Needs help with all home maintenance tasks    Meal  Prep  Able to complete simple warm meal prep    Medication Management  Takes responsibility if medication is prepared in advance in seperate dosage    Financial Management  Manages day-to-day purchases, but needs help with banking, major purchases, etc.      Mobility   Mobility Status  Independent;History of falls   uses rollator for ambulation     Vision Assessment   Vision Assessment  Vision tested    Visual Acuity  Per MD/OD report    Right visual acuity tested 1 meter  20/70-1    Left Visual Acuity tested 1 meter  CF @3  ft    Reading Acuity  (0.8)    Patient has diffculty with activities due to visual impairment  Reading bills;Writing checks      Cognition   Overall Cognitive Status  Within Functional Limits for tasks assessed    Mini Mental State Exam   27/30                      OT Education - 06/13/18 1727    Education Details  Pt/ caregiver were educated regarding 3x stand magnifier use. Pt demonstrates ability to read continous text following instructions. Pt was shown 4x handheld  magnifier but she prefers stand magnifier. Pt was educated regarding eccentric viewing, check line guides and hi-marks.    Person(s) Educated  Patient;Caregiver(s)    Methods  Explanation;Demonstration;Verbal cues;Handout   information regarding magnifier purchase provided   Comprehension  Verbalized understanding;Returned demonstration                 Plan - 06/13/18 1719    Clinical Impression Statement  Pt is a 84 y.o female with diagnosis of macular degeneration and diabetic retinopathy  who presents  with visual impairments which impede perfromance of ADLS, IADLS and reading ability. Pt was seen for evaluation and treatment on day of evaluation in order to maximize pt's safety and independence with ADLS/ IADLS and reading ability.    Occupational Profile and client history currently impacting functional performance  Pt lives with her family, she has hired caregivers several hours per day. Pt requires assistance with ADLs.    Occupational performance deficits (Please refer to evaluation for details):  ADL's;IADL's;Leisure;Social Participation    Rehab Potential  Good    OT Frequency  One time visit    OT Duration  12 weeks    OT Treatment/Interventions  Self-care/ADL training;Patient/family education;Visual/perceptual remediation/compensation;DME and/or AE instruction    Plan  Pt/ caregiver education was completed on day of evaluation, additional visits are not needed at this time.    Clinical Decision Making  Limited treatment options, no task modification necessary    Consulted and Agree with Plan of Care  Patient;Family member/caregiver    Family Member Consulted  hired caregiver       Patient will benefit from skilled therapeutic intervention in order to improve the following deficits and impairments:  Impaired vision/preception, Decreased safety awareness, Decreased knowledge of precautions  Visit Diagnosis: [place] involving central area, bilateral - Plan: Ot plan of  care cert/re-cert    Problem List Patient Active Problem List   Diagnosis Date Noted  . Upper airway cough syndrome 03/13/2018  . Sensorineural hearing loss (SNHL), bilateral 12/04/2016  . Post-nasal drainage 03/14/2016  . Presbycusis of both ears 03/14/2016  . Bilateral hearing loss 10/06/2015  . Bilateral impacted cerumen 10/06/2015  . 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 Subjective tinnitus of both  ears 10/06/2015  . Choledocholithiasis with chronic cholecystitis, nonobstructing 04/11/2015  . Pre-operative cardiovascular examination, high risk surgery   . Epigastric abdominal pain 04/08/2015  . Acute gallstone pancreatitis s/p lap chole w [facility] 04/11/2015 04/08/2015  . Diabetes mellitus type 2, controlled (HCC) 04/08/2015  . Gallstone pancreatitis 04/08/2015  . Mitral regurgitation 04/08/2015  . Chronic diastolic CHF (congestive heart failure) (HCC) 04/08/2015  . Dyspnea 03/31/2015  . Ischemic colitis (HCC) 08/09/2012  . Colitis 08/07/2012  . Rectal bleeding 08/07/2012  . Weakness 09/24/2011  . Bradycardia 09/24/2011  . UTI (lower urinary tract infection) 09/24/2011  . Hematochezia 09/24/2011  . DM (diabetes mellitus) (HCC) 09/24/2011  . Essential hypertension 09/24/2011  . Dyslipidemia 09/24/2011    Bernadette Gores 06/13/2018, 5:35 PM  [facility] [address] [place], [place], [address] Phone: [phone]   Fax:  [phone]  Name: Heidi Dominguez MRN: [id number] Date of Birth: 18-May-1921

## 2018-06-25 DIAGNOSIS — I10 Essential (primary) hypertension: Secondary | ICD-10-CM | POA: Diagnosis not present

## 2018-06-25 DIAGNOSIS — E11319 Type 2 diabetes mellitus with unspecified diabetic retinopathy without macular edema: Secondary | ICD-10-CM | POA: Diagnosis not present

## 2018-06-25 DIAGNOSIS — Z23 Encounter for immunization: Secondary | ICD-10-CM | POA: Diagnosis not present

## 2018-06-25 DIAGNOSIS — Z79899 Other long term (current) drug therapy: Secondary | ICD-10-CM | POA: Diagnosis not present

## 2018-07-04 ENCOUNTER — Emergency Department (HOSPITAL_COMMUNITY): Payer: Medicare Other

## 2018-07-04 ENCOUNTER — Encounter (HOSPITAL_COMMUNITY): Payer: Self-pay

## 2018-07-04 ENCOUNTER — Inpatient Hospital Stay (HOSPITAL_COMMUNITY): Payer: Medicare Other

## 2018-07-04 ENCOUNTER — Inpatient Hospital Stay (HOSPITAL_COMMUNITY)
Admission: EM | Admit: 2018-07-04 | Discharge: 2018-07-06 | DRG: 872 | Disposition: A | Discharge status: Home Health | Payer: Medicare Other | Attending: Internal Medicine | Admitting: Internal Medicine

## 2018-07-04 ENCOUNTER — Other Ambulatory Visit: Payer: Self-pay

## 2018-07-04 DIAGNOSIS — I5032 Chronic diastolic (congestive) heart failure: Secondary | ICD-10-CM | POA: Diagnosis not present

## 2018-07-04 DIAGNOSIS — N183 Chronic kidney disease, stage 3 unspecified: Secondary | ICD-10-CM | POA: Diagnosis present

## 2018-07-04 DIAGNOSIS — R933 Abnormal findings on diagnostic imaging of other parts of digestive tract: Secondary | ICD-10-CM | POA: Diagnosis not present

## 2018-07-04 DIAGNOSIS — Z882 Allergy status to sulfonamides status: Secondary | ICD-10-CM

## 2018-07-04 DIAGNOSIS — K449 Diaphragmatic hernia without obstruction or gangrene: Secondary | ICD-10-CM | POA: Diagnosis present

## 2018-07-04 DIAGNOSIS — A4151 Sepsis due to Escherichia coli [E. coli]: Secondary | ICD-10-CM | POA: Diagnosis present

## 2018-07-04 DIAGNOSIS — E785 Hyperlipidemia, unspecified: Secondary | ICD-10-CM | POA: Diagnosis present

## 2018-07-04 DIAGNOSIS — K838 Other specified diseases of biliary tract: Secondary | ICD-10-CM | POA: Diagnosis present

## 2018-07-04 DIAGNOSIS — Z8672 Personal history of thrombophlebitis: Secondary | ICD-10-CM

## 2018-07-04 DIAGNOSIS — Z7982 Long term (current) use of aspirin: Secondary | ICD-10-CM

## 2018-07-04 DIAGNOSIS — R000 Tachycardia, unspecified: Secondary | ICD-10-CM | POA: Diagnosis not present

## 2018-07-04 DIAGNOSIS — R109 Unspecified abdominal pain: Secondary | ICD-10-CM | POA: Diagnosis not present

## 2018-07-04 DIAGNOSIS — E119 Type 2 diabetes mellitus without complications: Secondary | ICD-10-CM

## 2018-07-04 DIAGNOSIS — R945 Abnormal results of liver function studies: Secondary | ICD-10-CM

## 2018-07-04 DIAGNOSIS — I130 Hypertensive heart and chronic kidney disease with heart failure and stage 1 through stage 4 chronic kidney disease, or unspecified chronic kidney disease: Secondary | ICD-10-CM | POA: Diagnosis present

## 2018-07-04 DIAGNOSIS — Z8249 Family history of ischemic heart disease and other diseases of the circulatory system: Secondary | ICD-10-CM | POA: Diagnosis not present

## 2018-07-04 DIAGNOSIS — R7401 Elevation of levels of liver transaminase levels: Secondary | ICD-10-CM

## 2018-07-04 DIAGNOSIS — R740 Nonspecific elevation of levels of transaminase and lactic acid dehydrogenase [LDH]: Secondary | ICD-10-CM

## 2018-07-04 DIAGNOSIS — K5730 Diverticulosis of large intestine without perforation or abscess without bleeding: Secondary | ICD-10-CM | POA: Diagnosis not present

## 2018-07-04 DIAGNOSIS — E1122 Type 2 diabetes mellitus with diabetic chronic kidney disease: Secondary | ICD-10-CM | POA: Diagnosis present

## 2018-07-04 DIAGNOSIS — E872 Acidosis, unspecified: Secondary | ICD-10-CM

## 2018-07-04 DIAGNOSIS — Z833 Family history of diabetes mellitus: Secondary | ICD-10-CM

## 2018-07-04 DIAGNOSIS — R932 Abnormal findings on diagnostic imaging of liver and biliary tract: Secondary | ICD-10-CM | POA: Diagnosis not present

## 2018-07-04 DIAGNOSIS — R935 Abnormal findings on diagnostic imaging of other abdominal regions, including retroperitoneum: Secondary | ICD-10-CM | POA: Diagnosis not present

## 2018-07-04 DIAGNOSIS — E876 Hypokalemia: Secondary | ICD-10-CM | POA: Diagnosis present

## 2018-07-04 DIAGNOSIS — Z7984 Long term (current) use of oral hypoglycemic drugs: Secondary | ICD-10-CM

## 2018-07-04 DIAGNOSIS — Z9181 History of falling: Secondary | ICD-10-CM

## 2018-07-04 DIAGNOSIS — K219 Gastro-esophageal reflux disease without esophagitis: Secondary | ICD-10-CM | POA: Diagnosis present

## 2018-07-04 DIAGNOSIS — A419 Sepsis, unspecified organism: Secondary | ICD-10-CM | POA: Diagnosis not present

## 2018-07-04 DIAGNOSIS — K5900 Constipation, unspecified: Secondary | ICD-10-CM | POA: Diagnosis present

## 2018-07-04 DIAGNOSIS — Z7951 Long term (current) use of inhaled steroids: Secondary | ICD-10-CM

## 2018-07-04 DIAGNOSIS — R748 Abnormal levels of other serum enzymes: Secondary | ICD-10-CM | POA: Diagnosis not present

## 2018-07-04 DIAGNOSIS — R001 Bradycardia, unspecified: Secondary | ICD-10-CM | POA: Diagnosis not present

## 2018-07-04 DIAGNOSIS — N390 Urinary tract infection, site not specified: Secondary | ICD-10-CM | POA: Diagnosis not present

## 2018-07-04 DIAGNOSIS — I10 Essential (primary) hypertension: Secondary | ICD-10-CM | POA: Diagnosis present

## 2018-07-04 DIAGNOSIS — H3530 Unspecified macular degeneration: Secondary | ICD-10-CM | POA: Diagnosis present

## 2018-07-04 DIAGNOSIS — R110 Nausea: Secondary | ICD-10-CM | POA: Diagnosis not present

## 2018-07-04 DIAGNOSIS — I499 Cardiac arrhythmia, unspecified: Secondary | ICD-10-CM | POA: Diagnosis not present

## 2018-07-04 DIAGNOSIS — R531 Weakness: Secondary | ICD-10-CM | POA: Diagnosis not present

## 2018-07-04 DIAGNOSIS — R7989 Other specified abnormal findings of blood chemistry: Secondary | ICD-10-CM | POA: Diagnosis present

## 2018-07-04 DIAGNOSIS — R0989 Other specified symptoms and signs involving the circulatory and respiratory systems: Secondary | ICD-10-CM | POA: Diagnosis not present

## 2018-07-04 HISTORY — DX: Dorsalgia, unspecified: M54.9

## 2018-07-04 HISTORY — DX: Family history of other specified conditions: Z84.89

## 2018-07-04 HISTORY — DX: Type 2 diabetes mellitus without complications: E11.9

## 2018-07-04 HISTORY — DX: Cardiac murmur, unspecified: R01.1

## 2018-07-04 HISTORY — DX: Unspecified osteoarthritis, unspecified site: M19.90

## 2018-07-04 HISTORY — DX: Phlebitis and thrombophlebitis of unspecified site: I80.9

## 2018-07-04 HISTORY — DX: Personal history of other diseases of the digestive system: Z87.19

## 2018-07-04 HISTORY — DX: Other chronic pain: G89.29

## 2018-07-04 LAB — BASIC METABOLIC PANEL
ANION GAP: 10 (ref 5–15)
BUN: 13 mg/dL (ref 8–23)
CO2: 28 mmol/L (ref 22–32)
Calcium: 9.3 mg/dL (ref 8.9–10.3)
Chloride: 99 mmol/L (ref 98–111)
Creatinine, Ser: 0.99 mg/dL (ref 0.44–1.00)
GFR, EST AFRICAN AMERICAN: 54 mL/min — AB (ref >60)
GFR, EST NON AFRICAN AMERICAN: 47 mL/min — AB (ref >60)
Glucose, Bld: 225 mg/dL — ABNORMAL HIGH (ref 70–99)
POTASSIUM: 3.8 mmol/L (ref 3.5–5.1)
SODIUM: 137 mmol/L (ref 135–145)

## 2018-07-04 LAB — HEPATIC FUNCTION PANEL
ALK PHOS: 249 U/L — AB (ref 38–126)
ALT: 1536 U/L — ABNORMAL HIGH (ref 0–44)
AST: 2705 U/L — ABNORMAL HIGH (ref 15–41)
Albumin: 3.1 g/dL — ABNORMAL LOW (ref 3.5–5.0)
BILIRUBIN INDIRECT: 0.8 mg/dL (ref 0.3–0.9)
BILIRUBIN TOTAL: 2.7 mg/dL — AB (ref 0.3–1.2)
Bilirubin, Direct: 1.9 mg/dL — ABNORMAL HIGH (ref 0.0–0.2)
TOTAL PROTEIN: 6.8 g/dL (ref 6.5–8.1)

## 2018-07-04 LAB — URINALYSIS, ROUTINE W REFLEX MICROSCOPIC
BILIRUBIN URINE: NEGATIVE
Glucose, UA: NEGATIVE mg/dL
Ketones, ur: NEGATIVE mg/dL
NITRITE: NEGATIVE
Protein, ur: NEGATIVE mg/dL
SPECIFIC GRAVITY, URINE: 1.011 (ref 1.005–1.030)
pH: 5 (ref 5.0–8.0)

## 2018-07-04 LAB — I-STAT CHEM 8, ED
BUN: 17 mg/dL (ref 8–23)
CALCIUM ION: 1.08 mmol/L — AB (ref 1.15–1.40)
CREATININE: 0.9 mg/dL (ref 0.44–1.00)
Chloride: 98 mmol/L (ref 98–111)
Glucose, Bld: 223 mg/dL — ABNORMAL HIGH (ref 70–99)
HCT: 47 % — ABNORMAL HIGH (ref 36.0–46.0)
Hemoglobin: 16 g/dL — ABNORMAL HIGH (ref 12.0–15.0)
Potassium: 3.9 mmol/L (ref 3.5–5.1)
SODIUM: 136 mmol/L (ref 135–145)
TCO2: 32 mmol/L (ref 22–32)

## 2018-07-04 LAB — GLUCOSE, CAPILLARY
GLUCOSE-CAPILLARY: 87 mg/dL (ref 70–99)
Glucose-Capillary: 82 mg/dL (ref 70–99)

## 2018-07-04 LAB — BLOOD CULTURE ID PANEL (REFLEXED)
Acinetobacter baumannii: NOT DETECTED
CANDIDA PARAPSILOSIS: NOT DETECTED
CANDIDA TROPICALIS: NOT DETECTED
CARBAPENEM RESISTANCE: NOT DETECTED
Candida albicans: NOT DETECTED
Candida glabrata: NOT DETECTED
Candida krusei: NOT DETECTED
ENTEROCOCCUS SPECIES: NOT DETECTED
Enterobacter cloacae complex: NOT DETECTED
Enterobacteriaceae species: DETECTED — AB
Escherichia coli: DETECTED — AB
Haemophilus influenzae: NOT DETECTED
KLEBSIELLA OXYTOCA: NOT DETECTED
KLEBSIELLA PNEUMONIAE: NOT DETECTED
Listeria monocytogenes: NOT DETECTED
Methicillin resistance: NOT DETECTED
Neisseria meningitidis: NOT DETECTED
PSEUDOMONAS AERUGINOSA: NOT DETECTED
Proteus species: NOT DETECTED
STAPHYLOCOCCUS AUREUS BCID: NOT DETECTED
STAPHYLOCOCCUS SPECIES: NOT DETECTED
STREPTOCOCCUS AGALACTIAE: NOT DETECTED
STREPTOCOCCUS PNEUMONIAE: NOT DETECTED
Serratia marcescens: NOT DETECTED
Streptococcus pyogenes: NOT DETECTED
Streptococcus species: NOT DETECTED
VANCOMYCIN RESISTANCE: NOT DETECTED

## 2018-07-04 LAB — HEMOGLOBIN A1C
HEMOGLOBIN A1C: 6.3 % — AB (ref 4.8–5.6)
MEAN PLASMA GLUCOSE: 134.11 mg/dL

## 2018-07-04 LAB — CBC
HCT: 46.7 % — ABNORMAL HIGH (ref 36.0–46.0)
HEMOGLOBIN: 14 g/dL (ref 12.0–15.0)
MCH: 28.3 pg (ref 26.0–34.0)
MCHC: 30 g/dL (ref 30.0–36.0)
MCV: 94.3 fL (ref 80.0–100.0)
NRBC: 0 % (ref 0.0–0.2)
Platelets: 109 10*3/uL — ABNORMAL LOW (ref 150–400)
RBC: 4.95 MIL/uL (ref 3.87–5.11)
RDW: 13.8 % (ref 11.5–15.5)
WBC: 11 10*3/uL — AB (ref 4.0–10.5)

## 2018-07-04 LAB — I-STAT CG4 LACTIC ACID, ED
LACTIC ACID, VENOUS: 3.29 mmol/L — AB (ref 0.5–1.9)
Lactic Acid, Venous: 2.41 mmol/L (ref 0.5–1.9)

## 2018-07-04 LAB — CBG MONITORING, ED: Glucose-Capillary: 119 mg/dL — ABNORMAL HIGH (ref 70–99)

## 2018-07-04 LAB — LIPASE, BLOOD: LIPASE: 352 U/L — AB (ref 11–51)

## 2018-07-04 LAB — LACTIC ACID, PLASMA: LACTIC ACID, VENOUS: 1.7 mmol/L (ref 0.5–1.9)

## 2018-07-04 MED ORDER — SODIUM CHLORIDE 0.9 % IV BOLUS
1000.0000 mL | Freq: Once | INTRAVENOUS | Status: AC
  Administered 2018-07-04: 1000 mL via INTRAVENOUS

## 2018-07-04 MED ORDER — AMLODIPINE BESYLATE 5 MG PO TABS
5.0000 mg | ORAL_TABLET | Freq: Every day | ORAL | Status: DC
  Administered 2018-07-04 – 2018-07-06 (×3): 5 mg via ORAL
  Filled 2018-07-04 (×4): qty 1

## 2018-07-04 MED ORDER — GADOBUTROL 1 MMOL/ML IV SOLN
8.0000 mL | Freq: Once | INTRAVENOUS | Status: AC | PRN
  Administered 2018-07-04: 8 mL via INTRAVENOUS

## 2018-07-04 MED ORDER — PIPERACILLIN-TAZOBACTAM 3.375 G IVPB 30 MIN
3.3750 g | Freq: Once | INTRAVENOUS | Status: AC
  Administered 2018-07-04: 3.375 g via INTRAVENOUS
  Filled 2018-07-04: qty 50

## 2018-07-04 MED ORDER — ASPIRIN 81 MG PO CHEW
81.0000 mg | CHEWABLE_TABLET | Freq: Every day | ORAL | Status: DC
  Administered 2018-07-05 – 2018-07-06 (×2): 81 mg via ORAL
  Filled 2018-07-04 (×2): qty 1

## 2018-07-04 MED ORDER — ACETAMINOPHEN 650 MG RE SUPP: 650.0000 mg | Freq: Four times a day (QID) | RECTAL | Status: DC | PRN

## 2018-07-04 MED ORDER — ASPIRIN 81 MG PO TABS: 81.0000 mg | ORAL_TABLET | Freq: Every day | ORAL | Status: DC

## 2018-07-04 MED ORDER — PIPERACILLIN-TAZOBACTAM 3.375 G IVPB
3.3750 g | Freq: Three times a day (TID) | INTRAVENOUS | Status: DC
  Administered 2018-07-04 – 2018-07-05 (×3): 3.375 g via INTRAVENOUS
  Filled 2018-07-04 (×3): qty 50

## 2018-07-04 MED ORDER — ALBUTEROL SULFATE (2.5 MG/3ML) 0.083% IN NEBU: 2.5000 mg | INHALATION_SOLUTION | Freq: Four times a day (QID) | RESPIRATORY_TRACT | Status: DC | PRN

## 2018-07-04 MED ORDER — MORPHINE SULFATE (PF) 2 MG/ML IV SOLN: 2.0000 mg | INTRAVENOUS | Status: DC | PRN

## 2018-07-04 MED ORDER — MOMETASONE FURO-FORMOTEROL FUM 200-5 MCG/ACT IN AERO
2.0000 | INHALATION_SPRAY | Freq: Two times a day (BID) | RESPIRATORY_TRACT | Status: DC
  Administered 2018-07-05 – 2018-07-06 (×2): 2 via RESPIRATORY_TRACT
  Filled 2018-07-04 (×2): qty 8.8

## 2018-07-04 MED ORDER — ONDANSETRON HCL 4 MG/2ML IJ SOLN: 4.0000 mg | Freq: Four times a day (QID) | INTRAMUSCULAR | Status: DC | PRN

## 2018-07-04 MED ORDER — IOPAMIDOL (ISOVUE-300) INJECTION 61%
80.0000 mL | Freq: Once | INTRAVENOUS | Status: AC | PRN
  Administered 2018-07-04: 80 mL via INTRAVENOUS

## 2018-07-04 MED ORDER — SODIUM CHLORIDE 0.9 % IV BOLUS
500.0000 mL | Freq: Once | INTRAVENOUS | Status: AC
  Administered 2018-07-04: 500 mL via INTRAVENOUS

## 2018-07-04 MED ORDER — ACETAMINOPHEN 325 MG PO TABS: 650.0000 mg | ORAL_TABLET | Freq: Four times a day (QID) | ORAL | Status: DC | PRN

## 2018-07-04 MED ORDER — INSULIN ASPART 100 UNIT/ML ~~LOC~~ SOLN
0.0000 [IU] | Freq: Three times a day (TID) | SUBCUTANEOUS | Status: DC
  Administered 2018-07-06: 2 [IU] via SUBCUTANEOUS

## 2018-07-04 MED ORDER — LACTATED RINGERS IV SOLN
INTRAVENOUS | Status: DC
  Administered 2018-07-04: 11:00:00 via INTRAVENOUS

## 2018-07-04 MED ORDER — ONDANSETRON HCL 4 MG PO TABS: 4.0000 mg | ORAL_TABLET | Freq: Four times a day (QID) | ORAL | Status: DC | PRN

## 2018-07-04 MED ORDER — HYDRALAZINE HCL 20 MG/ML IJ SOLN: 5.0000 mg | INTRAMUSCULAR | Status: DC | PRN

## 2018-07-04 NOTE — ED Triage Notes (Signed)
Pt comes from home via [facility] for generalized weakness that has been getting worse over the past four days with nausea and abdominal pain. Pt has a fall today where she slid out of bed, no injury noted, did not hit head no LOC.

## 2018-07-04 NOTE — ED Notes (Signed)
Heidi Dominguez [id number]

## 2018-07-04 NOTE — H&P (Signed)
History and Physical    Heidi Dominguez CWC:[id number] DOB: May 06, 1921 DOA: 07/04/2018  PCP: Lajean Manes, MD Consultants:   Melvyn Novas - pulmonology; Sallee Provencal - ENT; Windy Carina - podiatry Patient coming from:  Home - lives alone with intermittent caregivers; NOK: Daughter, [phone]; [phone]  Chief Complaint: Weakness  HPI: Heidi Dominguez is a 84 y.o. female with medical history significant of HTN; HLD; DM; macular degeneration; and chronic diastolic CHF presenting with weakness.  For the last couple of days, she has been complaining of abdominal pain.  She is s/'p cholecystectomy.  She does have severe GERD with intermittent choking sensation and regurgitation.  She does c/o periumbilical pain at this time although the family reports "liver area" pain.   +N/v.  No diarrhea recently.  She hasn't had a BM in a couple of days.  Yesterday, she ate chicken pot pie and Ensure but usually has a "healthy appetite."  No known fevers at home.  Her usual weight is 174lb.   ED Course:  Abdominal pain - high LFTs.  CT with probable external or internal compression.  Consult to GI pending.  Given Zosyn and IVF.    Review of Systems: As per HPI; otherwise review of systems reviewed and negative.   Ambulatory Status:  Ambulates with a rollator  Past Medical History:  Diagnosis Date  . Arthritis    "qwhere" (07/04/2018)  . Chronic back pain    "all over" (07/04/2018)  . Chronic diastolic CHF (congestive heart failure) ([place])   . Complication of anesthesia    "I have a hard time waking up"  . Degenerative joint disease of shoulder region   . Family history of adverse reaction to anesthesia    "daughter has the shakes when she wakes up"  . GERD (gastroesophageal reflux disease)   . Heart murmur   . High cholesterol   . History of hiatal hernia   . HOH (hard of hearing)   . Hypertension   . Osteoarthritis   . Phlebitis    "BLE"  . Type II diabetes mellitus ([place])     Past Surgical History:   Procedure Laterality Date  . ABDOMINAL HYSTERECTOMY    . BACK SURGERY    . CATARACT EXTRACTION W/ INTRAOCULAR LENS  IMPLANT, BILATERAL Bilateral   . FIXATION KYPHOPLASTY    . JOINT REPLACEMENT    . LAPAROSCOPIC CHOLECYSTECTOMY SINGLE SITE WITH INTRAOPERATIVE CHOLANGIOGRAM N/A 04/11/2015   Procedure: LAPAROSCOPIC LYSIS OF ADHESIONS, LAPAROSCOPIC CHOLECYSTECTOMY WITH INTRAOPERATIVE CHOLANGIOGRAM;  Surgeon: Michael Boston, MD;  Location: [facility];  Service: General;  Laterality: N/A;  . SHOULDER OPEN ROTATOR CUFF REPAIR Bilateral   . TOTAL KNEE ARTHROPLASTY Bilateral     Social History   Socioeconomic History  . Marital status: Widowed    Spouse name: Not on file  . Number of children: Not on file  . Years of education: Not on file  . Highest education level: Not on file  Occupational History  . Occupation: retired  Scientific laboratory technician  . Financial resource strain: Not on file  . Food insecurity:    Worry: Not on file    Inability: Not on file  . Transportation needs:    Medical: Not on file    Non-medical: Not on file  Tobacco Use  . Smoking status: Never Smoker  . Smokeless tobacco: Never Used  Substance and Sexual Activity  . Alcohol use: No    Comment: rare  . Drug use: No  . Sexual activity: Not on file  Lifestyle  . Physical activity:    Days per week: Not on file    Minutes per session: Not on file  . Stress: Not on file  Relationships  . Social connections:    Talks on phone: Not on file    Gets together: Not on file    Attends religious service: Not on file    Active member of club or organization: Not on file    Attends meetings of clubs or organizations: Not on file    Relationship status: Not on file  . Intimate partner violence:    Fear of current or ex partner: Not on file    Emotionally abused: Not on file    Physically abused: Not on file    Forced sexual activity: Not on file  Other Topics Concern  . Not on file  Social History Narrative  . Not on file      Allergies  Allergen Reactions  . Catapres [Clonidine Hcl] Other (See Comments)    Unknown. Unknown.  Lorrin Goodell [Verapamil Hcl] Other (See Comments)    Unknown.  . Other Other (See Comments)    Any type of narcotics Feels "loopy" Any type of narcotics Feels "loopy"  . Sulfa Antibiotics Itching  . Sulfamethoxazole Diarrhea  . Verapamil Diarrhea and Other (See Comments)  . Sulfasalazine Itching    Family History  Problem Relation Age of Onset  . Hypertension Mother   . Diabetes Mellitus I Mother   . Hypertension Father     Prior to Admission medications   Medication Sig Start Date End Date Taking? Authorizing Provider  albuterol (PROVENTIL HFA;VENTOLIN HFA) 108 (90 Base) MCG/ACT inhaler Inhale into the lungs every 6 (six) hours as needed for wheezing or shortness of breath.   Yes [provider]  albuterol (PROVENTIL) (2.5 MG/3ML) 0.083% nebulizer solution Take 3 mLs (2.5 mg total) by nebulization every 6 (six) hours as needed for wheezing or shortness of breath. 03/13/18  Yes Tanda Rockers, MD  amLODipine (NORVASC) 10 MG tablet Take 1 tablet (10 mg total) by mouth daily. Patient taking differently: Take 5 mg by mouth daily.  04/13/15  Yes Hongalgi, Lenis Dickinson, MD  amLODipine (NORVASC) 5 MG tablet Take 5 mg by mouth daily.  05/05/18  Yes [provider]  aspirin 81 MG tablet Take 81 mg by mouth daily.   Yes [provider]  budesonide-formoterol (SYMBICORT) 160-4.5 MCG/ACT inhaler Inhale 2 puffs into the lungs daily.  04/13/15  Yes [provider]  famotidine (PEPCID) 20 MG tablet Take 20 mg by mouth at bedtime.   Yes [provider]  Ferrous Gluconate (IRON) 240 (27 FE) MG TABS Take 1 tablet by mouth daily.   Yes [provider]  furosemide (LASIX) 40 MG tablet Take 40 mg by mouth daily.    Yes [provider]  JANUVIA 50 MG tablet Take 50 mg by mouth daily.  03/11/15  Yes [provider]  loratadine  (CLARITIN) 10 MG tablet Take 10 mg by mouth daily.   Yes [provider]  potassium chloride (K-DUR,KLOR-CON) 10 MEQ tablet Take 10 mEq by mouth daily.  03/22/15  Yes [provider]  vitamin B-12 (CYANOCOBALAMIN) 500 MCG tablet Take 1,000 mcg by mouth daily.    Yes [provider]  pantoprazole (PROTONIX) 40 MG tablet TAKE 1 TABLET(40 MG) BY MOUTH DAILY 30 TO 60 MINUTES BEFORE FIRST MEAL OF THE DAY Patient not taking: Reported on 07/04/2018 06/07/18   Christinia Gully  B, MD    Physical Exam: Vitals:   07/04/18 1315 07/04/18 1515 07/04/18 1530 07/04/18 1711  BP: (!) 168/72 (!) 166/71 (!) 115/97 (!) 148/68  Pulse: 91 90 94 87  Resp: (!) 25 (!) 25 18 16   Temp:    99.4 F (37.4 C)  TempSrc:      SpO2: 95% 93% 95% 94%  Weight:      Height:         General:  Appears calm and comfortable and is NAD Eyes:  PERRL, EOMI, normal lids, iris ENT:  grossly normal hearing, lips & tongue, mmm; edentulous Neck:  no LAD, masses or thyromegaly Cardiovascular:  RRR, no m/r/g. No LE edema.  Respiratory:   CTA bilaterally with no wheezes/rales/rhonchi.  Normal respiratory effort. Abdomen:  soft, mild diffuse abdominal pain but worse in midepigastric region, ND, NABS Skin:  no rash or induration seen on limited exam Musculoskeletal:  grossly normal tone BUE/BLE, good ROM, no bony abnormality Psychiatric:  grossly normal mood and affect, speech fluent and appropriate, AOx3 Neurologic:  CN 2-12 grossly intact, moves all extremities in coordinated fashion, sensation intact    Radiological Exams on Admission: Dg Chest 2 View  Result Date: 07/04/2018 CLINICAL DATA:  generalized weakness that has been getting worse over the past four days with nausea and abdominal pain. Pt has a fall today where she slid out of bed, no injury EXAM: CHEST - 2 VIEW COMPARISON:  03/13/2018 FINDINGS: Mild cardiac enlargement. No vascular congestion. No edema or consolidation in the lungs. No blunting of  costophrenic angles. No pneumothorax. Esophageal hiatal hernia behind the heart. Calcification of the aorta. Postoperative changes in both shoulders. Degenerative changes in the spine. Anterior compression deformities of lower thoracic and upper lumbar vertebrae without change. IMPRESSION: Cardiac enlargement. No evidence of active pulmonary disease. Electronically Signed   By: Lucienne Capers M.D.   On: 07/04/2018 04:43   Ct Abdomen Pelvis W Contrast  Result Date: 07/04/2018 CLINICAL DATA:  84 year old female with abdominal pain, nausea vomiting. EXAM: CT ABDOMEN AND PELVIS WITH CONTRAST TECHNIQUE: Multidetector CT imaging of the abdomen and pelvis was performed using the standard protocol following bolus administration of intravenous contrast. CONTRAST:  64mL ISOVUE-300 IOPAMIDOL (ISOVUE-300) INJECTION 61% COMPARISON:  CT Abdomen and Pelvis 08/07/2012. FINDINGS: Lower chest: Chronic cardiomegaly, calcified coronary artery atherosclerosis, moderate to large gastric hiatal hernia. Increased lung base atelectasis greater on the left. No pericardial or pleural effusion. Hepatobiliary: Gallbladder is surgically absent, new since 2013. Intra- and extrahepatic biliary ductal enlargement may be postoperative. Otherwise negative liver. Pancreas: Negative. Spleen: Negative. Adrenals/Urinary Tract: Stable mild adrenal gland thickening. Chronic right renal midpole benign cysts. Renal enhancement and contrast excretion is symmetric and within normal limits. No hydronephrosis or hydroureter. There is prominent gas within the urinary bladder (series 3, image 74). No perivesical stranding. Stomach/Bowel: Negative rectum. Decompressed distal sigmoid colon. Proximal sigmoid diverticulosis, with an indistinct appearance of the bowel wall in that segment but similar to that in 2013. No sigmoid mesenteric stranding. Occasional diverticula in the descending colon. Redundant transverse colon. The cecum is on a lax mesentery  located in the right upper abdomen. There is no convincing large bowel inflammation. Negative negative terminal ileum. No dilated small bowel. No mesenteric stranding. Decompressed intra-abdominal stomach. Small volume of fluid in the gastric hernia. Negative duodenum. No free air, free fluid. Vascular/Lymphatic: Aortoiliac calcified atherosclerosis. Suboptimal intravascular contrast bolus but the major arterial structures in the abdomen and pelvis appear patent. Portal venous system is  patent. No lymphadenopathy. Reproductive: Surgically absent uterus. Diminutive or absent ovaries. Other: No pelvic free fluid. Musculoskeletal: Chronic severe T12 and L1 compression fractures. Chronic previously augmented L3 compression fracture. Severe chronic lower thoracic and lumbar spine degeneration with mild scoliosis. No acute osseous abnormality identified. IMPRESSION: 1. Gas within the urinary bladder suspicious for UTI unless explained by recent catheterization. 2. Cholecystectomy since the prior CT in 2013. Intra- and extrahepatic biliary ductal enlargement may be postoperative in nature, but if there is hyperbilirubinemia then consider distal CBD obstruction. 3. Sigmoid diverticulosis. Moderate to large gastric hiatal hernia. No convincing bowel inflammation. 4. Calcified coronary artery and Aortic Atherosclerosis (ICD10-I70.0). Cardiomegaly. 5. Chronic lower thoracic and lumbar compression fractures and advanced spinal degeneration. Electronically Signed   By: Genevie Ann M.D.   On: 07/04/2018 07:53    EKG: Independently reviewed.  Sinus tachycardia with rate 103; LBBB, PVCs   Labs on Admission: I have personally reviewed the available labs and imaging studies at the time of the admission.  Pertinent labs:   Glucose 225 AP 249 Albumin 3.1 Lipase 352 AST 2705/ALT 1536/Bili 2.7 BUN 13/Creatinine 0.99/GFR 54 - stable Lactate 2.41, 3.29 WBC 11.0 Platelets 109 UA: small glucose; large LE; many bacteria; >50  WBC  Assessment/Plan Principal Problem:   Elevated LFTs Active Problems:   Essential hypertension   Dyslipidemia   Diabetes mellitus type 2, controlled (H[facility])   Chronic diastolic CHF (congestive heart failure) ([facility])   CKD (chronic kidney disease) stage 3, GFR 30-59 ml/min ([facility])   Abdominal pain/elevated LFTs -Patient presenting with nonspecific abdominal pain, n/v -LFTs with marked elevation -CT concerning for obstructive process -GI consult - Dr. Paulita Fujita has seen the patient -She had a Cholangiogram 3 years ago - maybe chronic so less suspicious for obstruction.   -Will order MRI/MRCP to determine if ERCP/stent is needed. -If negative, needs further evaluation for causes of elevated LFTs -Will order hepatitis panel -Recheck CMP tomorrow -Empiric treatment with Zosyn -Lactate did not clear and actually worsened after abx/fluid, indicating an alternate cause for hypoperfusion - likely associated with elevated LFTs -Will recheck  UTI -Patient with abnormal UA and apparent bladder gas c/w UTI on CT -Zosyn should be appropriate treatment -Urine culture is pending  HTN -Continue Norvasc  DM -hold Glucophage -Cover with moderate-scale SSI  Chronic diastolic CHF -5/57 echo with preserved EF and grade 1 diastolic dysfunction -Appears to be compensated at this time -She did receive significant IVF in the ER, need to monitor for respiratory compromise and given Lasix if it develops  Stage 3 CKD -Appears to be stable at this time  DVT prophylaxis:  SCDs Code Status:  Full - confirmed with patient/family Family Communication: Son and then daughter present for discussion  Disposition Plan:  Home once clinically improved Consults called: GI  Admission status: Admit - It is my clinical opinion that admission to [place] is reasonable and necessary because of the expectation that this patient will require hospital care that crosses at least 2 midnights to treat this condition based  on the medical complexity of the problems presented.  Given the aforementioned information, the predictability of an adverse outcome is felt to be significant.     Karmen Bongo MD [facility]  If note is complete, please contact covering daytime or nighttime physician. [email or web address] Password [facility]  07/04/2018, 7:32 PM

## 2018-07-04 NOTE — ED Notes (Addendum)
Patient's family member requesting to speak with Dr. Lorin Mercy. Paged Dr. Lorin Mercy and she stated that she will come by to update the family when she has time today. Family verbalizes agreement to this plan.

## 2018-07-04 NOTE — ED Notes (Signed)
Pt given lunch tray. Family at the bedside assisting patient with eating.

## 2018-07-04 NOTE — ED Notes (Signed)
Patient's daughter upset that the patient's brief had not been changed. This RN explained to the daughter and the patient that an external urinary catheter is in place (pur wick) was in place and that it was working properly as evidenced by placement and urinary output into suction container. Daughter spoke to this RN in a forceful tone and stated "it is unacceptable that my mother has not been changed." This RN went with two techs to change patient's brief. The brief was completely dry and the patient was about to be transported off the unit to her bed on 5W. This RN explained to the patient and daughter that a new pur wick would be placed upstairs. Both patient and daughter verbalized understanding and thanked this RN for care.

## 2018-07-04 NOTE — Progress Notes (Signed)
Pharmacy Antibiotic Note  Heidi Dominguez is a 84 y.o. female admitted on 07/04/2018 with weakness.  Pharmacy has been consulted for zosyn dosing. Pt is afebrile and WBC is mildly elevated at 11. SCr is WNL.   Plan: Zosyn 3.375gm IV Q8H (4 hr inf) F/u renal fxn, C&S, clinical status   Height: 5\' 5"  (165.1 cm) Weight: 175 lb (79.4 kg) IBW/kg (Calculated) : 57  Temp (24hrs), Avg:98.8 F (37.1 C), Min:98.8 F (37.1 C), Max:98.8 F (37.1 C)  Recent Labs  Lab 07/04/18 0406 07/04/18 0427 07/04/18 0612  WBC 11.0*  --   --   CREATININE 0.99 0.90  --   LATICACIDVEN  --  2.41* 3.29*    Estimated Creatinine Clearance: 39 mL/min (by C-G formula based on SCr of 0.9 mg/dL).    Allergies  Allergen Reactions  . Catapres [Clonidine Hcl] Other (See Comments)    Unknown. Unknown.  Lorrin Goodell [Verapamil Hcl] Other (See Comments)    Unknown.  . Other Other (See Comments)    Any type of narcotics Feels "loopy" Any type of narcotics Feels "loopy"  . Sulfa Antibiotics Itching  . Sulfamethoxazole Diarrhea  . Verapamil Diarrhea and Other (See Comments)  . Sulfasalazine Itching    Antimicrobials this admission: Zosyn 11/21>>  Dose adjustments this admission: N/A  Microbiology results: Pending  Thank you for allowing pharmacy to be a part of this patient's care.  Kristan Brummitt, Rande Lawman 07/04/2018 9:52 AM

## 2018-07-04 NOTE — ED Notes (Signed)
Son refuses to allow pt to be moved to a progressive bed while waiting in the ED. Informed family of new process, son became very hostile and states "my mother aint going out there for everyone to fu*king look at"

## 2018-07-04 NOTE — Progress Notes (Addendum)
PHARMACY - PHYSICIAN COMMUNICATION CRITICAL VALUE ALERT - BLOOD CULTURE IDENTIFICATION (BCID)  Heidi Dominguez is an 84 y.o. female who presented to [facility] on 07/04/2018 with a chief complaint of weakness and abdominal pain  Assessment:  Intra-abdominal infection and possible UTI. Blood cultures show GNR in the anaerobic bottle in 1/4 cultures. BCID shows ecoli  Name of physician (or Provider) Contacted:  Current antibiotics: Zosyn  Changes to prescribed antibiotics recommended:  -No changes in antibiotics at this time  Results for orders placed or performed during the hospital encounter of 07/04/18  Blood Culture ID Panel (Reflexed) (Collected: 07/04/2018  5:57 AM)  Result Value Ref Range   Enterococcus species NOT DETECTED NOT DETECTED   Vancomycin resistance NOT DETECTED NOT DETECTED   Listeria monocytogenes NOT DETECTED NOT DETECTED   Staphylococcus species NOT DETECTED NOT DETECTED   Staphylococcus aureus (BCID) NOT DETECTED NOT DETECTED   Methicillin resistance NOT DETECTED NOT DETECTED   Streptococcus species NOT DETECTED NOT DETECTED   Streptococcus agalactiae NOT DETECTED NOT DETECTED   Streptococcus pneumoniae NOT DETECTED NOT DETECTED   Streptococcus pyogenes NOT DETECTED NOT DETECTED   Acinetobacter baumannii NOT DETECTED NOT DETECTED   Enterobacteriaceae species DETECTED (A) NOT DETECTED   Enterobacter cloacae complex NOT DETECTED NOT DETECTED   Escherichia coli DETECTED (A) NOT DETECTED   Klebsiella oxytoca NOT DETECTED NOT DETECTED   Klebsiella pneumoniae NOT DETECTED NOT DETECTED   Proteus species NOT DETECTED NOT DETECTED   Serratia marcescens NOT DETECTED NOT DETECTED   Carbapenem resistance NOT DETECTED NOT DETECTED   Haemophilus influenzae NOT DETECTED NOT DETECTED   Neisseria meningitidis NOT DETECTED NOT DETECTED   Pseudomonas aeruginosa NOT DETECTED NOT DETECTED   Candida albicans NOT DETECTED NOT DETECTED   Candida glabrata NOT DETECTED NOT  DETECTED   Candida krusei NOT DETECTED NOT DETECTED   Candida parapsilosis NOT DETECTED NOT DETECTED   Candida tropicalis NOT DETECTED NOT DETECTED    Dareen Piano 07/04/2018  9:57 PM

## 2018-07-04 NOTE — ED Notes (Signed)
Attempted second IV stick x 2

## 2018-07-04 NOTE — ED Notes (Signed)
Dr. Yates at the bedside  

## 2018-07-04 NOTE — ED Notes (Signed)
Patient given green swabs to wet mouth.

## 2018-07-04 NOTE — Consult Note (Signed)
[facility] Consultation Note  Referring Provider:  Dr. Lorin Mercy (MT[facility]) Primary Care Physician:  Lajean Manes, MD  Reason for Consultation:  Abdominal pain  HPI: Heidi Dominguez is a 84 y.o. female admitted with dilated bile duct (no change since 2016), abdominal pain (generalized), constipation, elevated LFTs.  Intermittent abdominal pains for years, better after bowel movement.  No weight loss.  Cholecystectomy 2016 with IOC showing significantly dilated CBD, good flow into duodenum, possible small stone versus artifact in distal CBD.   Past Medical History:  Diagnosis Date  . Chronic diastolic CHF (congestive heart failure) ([place])   . Complication of anesthesia    I have a hard time waking up"  . Degenerative joint disease of shoulder region   . Diabetes mellitus   . GERD (gastroesophageal reflux disease)   . High cholesterol   . HOH (hard of hearing)   . Hypertension   . Osteoarthritis   . Pneumonia     Past Surgical History:  Procedure Laterality Date  . ABDOMINAL HYSTERECTOMY    . BACK SURGERY    . cataracts     bilateral  . KNEE SURGERY    . LAPAROSCOPIC CHOLECYSTECTOMY SINGLE SITE WITH INTRAOPERATIVE CHOLANGIOGRAM N/A 04/11/2015   Procedure: LAPAROSCOPIC LYSIS OF ADHESIONS, LAPAROSCOPIC CHOLECYSTECTOMY WITH INTRAOPERATIVE CHOLANGIOGRAM;  Surgeon: Michael Boston, MD;  Location: [facility];  Service: General;  Laterality: N/A;  . SHOULDER SURGERY      Prior to Admission medications   Medication Sig Start Date End Date Taking? Authorizing Provider  albuterol (PROVENTIL HFA;VENTOLIN HFA) 108 (90 Base) MCG/ACT inhaler Inhale into the lungs every 6 (six) hours as needed for wheezing or shortness of breath.   Yes [provider]  albuterol (PROVENTIL) (2.5 MG/3ML) 0.083% nebulizer solution Take 3 mLs (2.5 mg total) by nebulization every 6 (six) hours as needed for wheezing or shortness of breath. 03/13/18  Yes Tanda Rockers, MD  amLODipine (NORVASC) 10 MG tablet  Take 1 tablet (10 mg total) by mouth daily. Patient taking differently: Take 5 mg by mouth daily.  04/13/15  Yes Hongalgi, Lenis Dickinson, MD  aspirin 81 MG tablet Take 81 mg by mouth daily.   Yes [provider]  budesonide-formoterol (SYMBICORT) 160-4.5 MCG/ACT inhaler Inhale 2 puffs into the lungs daily.  04/13/15  Yes [provider]  famotidine (PEPCID) 20 MG tablet Take 20 mg by mouth at bedtime.   Yes [provider]  Ferrous Gluconate (IRON) 240 (27 FE) MG TABS Take 1 tablet by mouth daily.   Yes [provider]  furosemide (LASIX) 40 MG tablet Take 40 mg by mouth daily.    Yes [provider]  JANUVIA 50 MG tablet Take 50 mg by mouth daily.  03/11/15  Yes [provider]  loratadine (CLARITIN) 10 MG tablet Take 10 mg by mouth daily.   Yes [provider]  potassium chloride (K-DUR,KLOR-CON) 10 MEQ tablet Take 10 mEq by mouth daily.  03/22/15  Yes [provider]  vitamin B-12 (CYANOCOBALAMIN) 500 MCG tablet Take 1,000 mcg by mouth daily.    Yes [provider]    Current Facility-Administered Medications  Medication Dose Route Frequency Provider Last Rate Last Dose  . acetaminophen (TYLENOL) tablet 650 mg  650 mg Oral Q6H PRN Karmen Bongo, MD       Or  . acetaminophen (TYLENOL) suppository 650 mg  650 mg Rectal Q6H PRN Karmen Bongo, MD      . albuterol (PROVENTIL) (2.5 MG/3ML) 0.083% nebulizer  solution 2.5 mg  2.5 mg Nebulization Q6H PRN Karmen Bongo, MD      . amLODipine (NORVASC) tablet 5 mg  5 mg Oral Daily Karmen Bongo, MD   5 mg at 07/04/18 1032  . [START ON 07/05/2018] aspirin chewable tablet 81 mg  81 mg Oral Daily Rumbarger, Valeda Malm, RPH      . hydrALAZINE (APRESOLINE) injection 5 mg  5 mg Intravenous Q4H PRN Karmen Bongo, MD      . insulin aspart (novoLOG) injection 0-15 Units  0-15 Units Subcutaneous TID WC Karmen Bongo, MD      . mometasone-formoterol [facility]) 200-5 MCG/ACT inhaler 2  puff  2 puff Inhalation BID Karmen Bongo, MD      . morphine 2 MG/ML injection 2 mg  2 mg Intravenous Q2H PRN Karmen Bongo, MD      . ondansetron [facility]) tablet 4 mg  4 mg Oral Q6H PRN Karmen Bongo, MD       Or  . ondansetron [facility]) injection 4 mg  4 mg Intravenous Q6H PRN Karmen Bongo, MD      . piperacillin-tazobactam (ZOSYN) IVPB 3.375 g  3.375 g Intravenous Q8H Rumbarger, Rachel L, RPH 12.5 mL/hr at 07/04/18 1300 3.375 g at 07/04/18 1300   Current Outpatient Medications  Medication Sig Dispense Refill  . albuterol (PROVENTIL HFA;VENTOLIN HFA) 108 (90 Base) MCG/ACT inhaler Inhale into the lungs every 6 (six) hours as needed for wheezing or shortness of breath.    Marland Kitchen albuterol (PROVENTIL) (2.5 MG/3ML) 0.083% nebulizer solution Take 3 mLs (2.5 mg total) by nebulization every 6 (six) hours as needed for wheezing or shortness of breath. 75 mL 12  . amLODipine (NORVASC) 10 MG tablet Take 1 tablet (10 mg total) by mouth daily. (Patient taking differently: Take 5 mg by mouth daily. )    . aspirin 81 MG tablet Take 81 mg by mouth daily.    . budesonide-formoterol (SYMBICORT) 160-4.5 MCG/ACT inhaler Inhale 2 puffs into the lungs daily.     . famotidine (PEPCID) 20 MG tablet Take 20 mg by mouth at bedtime.    . Ferrous Gluconate (IRON) 240 (27 FE) MG TABS Take 1 tablet by mouth daily.    . furosemide (LASIX) 40 MG tablet Take 40 mg by mouth daily.     Marland Kitchen JANUVIA 50 MG tablet Take 50 mg by mouth daily.   5  . loratadine (CLARITIN) 10 MG tablet Take 10 mg by mouth daily.    . potassium chloride (K-DUR,KLOR-CON) 10 MEQ tablet Take 10 mEq by mouth daily.     . vitamin B-12 (CYANOCOBALAMIN) 500 MCG tablet Take 1,000 mcg by mouth daily.       Allergies as of 07/04/2018 - Review Complete 07/04/2018  Allergen Reaction Noted  . Catapres [clonidine hcl] Other (See Comments) 09/24/2011  . Covera-hs [verapamil hcl] Other (See Comments) 09/24/2011  . Other Other (See Comments) 09/24/2011  .  Sulfa antibiotics Itching 09/24/2011  . Sulfamethoxazole Diarrhea 10/06/2015  . Verapamil Diarrhea and Other (See Comments) 10/13/2015  . Sulfasalazine Itching 09/24/2011    Family History  Problem Relation Age of Onset  . Hypertension Mother   . Diabetes Mellitus I Mother   . Hypertension Father     Social History   Socioeconomic History  . Marital status: Widowed    Spouse name: Not on file  . Number of children: Not on file  . Years of education: Not on file  . Highest education level: Not on file  Occupational  History  . Occupation: retired  Scientific laboratory technician  . Financial resource strain: Not on file  . Food insecurity:    Worry: Not on file    Inability: Not on file  . Transportation needs:    Medical: Not on file    Non-medical: Not on file  Tobacco Use  . Smoking status: Never Smoker  . Smokeless tobacco: Never Used  Substance and Sexual Activity  . Alcohol use: No    Comment: rare  . Drug use: No  . Sexual activity: Not on file  Lifestyle  . Physical activity:    Days per week: Not on file    Minutes per session: Not on file  . Stress: Not on file  Relationships  . Social connections:    Talks on phone: Not on file    Gets together: Not on file    Attends religious service: Not on file    Active member of club or organization: Not on file    Attends meetings of clubs or organizations: Not on file    Relationship status: Not on file  . Intimate partner violence:    Fear of current or ex partner: Not on file    Emotionally abused: Not on file    Physically abused: Not on file    Forced sexual activity: Not on file  Other Topics Concern  . Not on file  Social History Narrative  . Not on file    Review of Systems: As per HPI, all others negative  Physical Exam: Vital signs in last 24 hours: Temp:  [98.8 F (37.1 C)] 98.8 F (37.1 C) (11/21 0337) Pulse Rate:  [49-102] 91 (11/21 1315) Resp:  [17-26] 25 (11/21 1315) BP: (113-168)/(50-114) 168/72  (11/21 1315) SpO2:  [90 %-98 %] 95 % (11/21 1315) Weight:  [79.4 kg] 79.4 kg (11/21 0754)   General:   Alert, younger-appearing than stated age, Well-developed, well-nourished, pleasant and cooperative in NAD Head:  Normocephalic and atraumatic. Eyes:  Sclera clear, no icterus.   Conjunctiva pink. Ears:  Normal auditory acuity. Nose:  No deformity, discharge,  or lesions. Mouth:  No deformity or lesions.  Oropharynx pink & moist. Neck:  Supple; no masses or thyromegaly. Lungs:  Clear throughout to auscultation.   No wheezes, crackles, or rhonchi. No acute distress. Heart:  Regular rate and rhythm; no murmurs, clicks, rubs,  or gallops. Abdomen:  Soft, nontender and nondistended. No masses, hepatosplenomegaly or hernias noted. Normal bowel sounds, without guarding, and without rebound.     Msk:  Symmetrical without gross deformities. Normal posture. Pulses:  Normal pulses noted. Extremities:  Without clubbing or edema. Neurologic:  Alert and  oriented x4;  grossly normal neurologically. Skin:  Intact without significant lesions or rashes. Cervical Nodes:  No significant cervical adenopathy. Psych:  Alert and cooperative. Normal mood and affect.   Lab Results: Recent Labs    07/04/18 0406 07/04/18 0427  WBC 11.0*  --   HGB 14.0 16.0*  HCT 46.7* 47.0*  PLT 109*  --    BMET Recent Labs    07/04/18 0406 07/04/18 0427  NA 137 136  K 3.8 3.9  CL 99 98  CO2 28  --   GLUCOSE 225* 223*  BUN 13 17  CREATININE 0.99 0.90  CALCIUM 9.3  --    LFT Recent Labs    07/04/18 0406  PROT 6.8  ALBUMIN 3.1*  AST 2,705*  ALT 1,536*  ALKPHOS 249*  BILITOT 2.7*  BILIDIR 1.9*  IBILI 0.8   PT/INR No results for input(s): LABPROT, INR in the last 72 hours.  Studies/Results: Dg Chest 2 View  Result Date: 07/04/2018 CLINICAL DATA:  generalized weakness that has been getting worse over the past four days with nausea and abdominal pain. Pt has a fall today where she slid out of bed,  no injury EXAM: CHEST - 2 VIEW COMPARISON:  03/13/2018 FINDINGS: Mild cardiac enlargement. No vascular congestion. No edema or consolidation in the lungs. No blunting of costophrenic angles. No pneumothorax. Esophageal hiatal hernia behind the heart. Calcification of the aorta. Postoperative changes in both shoulders. Degenerative changes in the spine. Anterior compression deformities of lower thoracic and upper lumbar vertebrae without change. IMPRESSION: Cardiac enlargement. No evidence of active pulmonary disease. Electronically Signed   By: Lucienne Capers M.D.   On: 07/04/2018 04:43   Ct Abdomen Pelvis W Contrast  Result Date: 07/04/2018 CLINICAL DATA:  84 year old female with abdominal pain, nausea vomiting. EXAM: CT ABDOMEN AND PELVIS WITH CONTRAST TECHNIQUE: Multidetector CT imaging of the abdomen and pelvis was performed using the standard protocol following bolus administration of intravenous contrast. CONTRAST:  37mL ISOVUE-300 IOPAMIDOL (ISOVUE-300) INJECTION 61% COMPARISON:  CT Abdomen and Pelvis 08/07/2012. FINDINGS: Lower chest: Chronic cardiomegaly, calcified coronary artery atherosclerosis, moderate to large gastric hiatal hernia. Increased lung base atelectasis greater on the left. No pericardial or pleural effusion. Hepatobiliary: Gallbladder is surgically absent, new since 2013. Intra- and extrahepatic biliary ductal enlargement may be postoperative. Otherwise negative liver. Pancreas: Negative. Spleen: Negative. Adrenals/Urinary Tract: Stable mild adrenal gland thickening. Chronic right renal midpole benign cysts. Renal enhancement and contrast excretion is symmetric and within normal limits. No hydronephrosis or hydroureter. There is prominent gas within the urinary bladder (series 3, image 74). No perivesical stranding. Stomach/Bowel: Negative rectum. Decompressed distal sigmoid colon. Proximal sigmoid diverticulosis, with an indistinct appearance of the bowel wall in that segment but  similar to that in 2013. No sigmoid mesenteric stranding. Occasional diverticula in the descending colon. Redundant transverse colon. The cecum is on a lax mesentery located in the right upper abdomen. There is no convincing large bowel inflammation. Negative negative terminal ileum. No dilated small bowel. No mesenteric stranding. Decompressed intra-abdominal stomach. Small volume of fluid in the gastric hernia. Negative duodenum. No free air, free fluid. Vascular/Lymphatic: Aortoiliac calcified atherosclerosis. Suboptimal intravascular contrast bolus but the major arterial structures in the abdomen and pelvis appear patent. Portal venous system is patent. No lymphadenopathy. Reproductive: Surgically absent uterus. Diminutive or absent ovaries. Other: No pelvic free fluid. Musculoskeletal: Chronic severe T12 and L1 compression fractures. Chronic previously augmented L3 compression fracture. Severe chronic lower thoracic and lumbar spine degeneration with mild scoliosis. No acute osseous abnormality identified. IMPRESSION: 1. Gas within the urinary bladder suspicious for UTI unless explained by recent catheterization. 2. Cholecystectomy since the prior CT in 2013. Intra- and extrahepatic biliary ductal enlargement may be postoperative in nature, but if there is hyperbilirubinemia then consider distal CBD obstruction. 3. Sigmoid diverticulosis. Moderate to large gastric hiatal hernia. No convincing bowel inflammation. 4. Calcified coronary artery and Aortic Atherosclerosis (ICD10-I70.0). Cardiomegaly. 5. Chronic lower thoracic and lumbar compression fractures and advanced spinal degeneration. Electronically Signed   By: Genevie Ann M.D.   On: 07/04/2018 07:53    Impression:  1.  Dilated bile duct; no significant change cf. Intraoperative cholangiogram 2016. 2.  Elevated LFTs. 3.  Abdominal pain, periumbilical, intermittent; improves after bowel movement. 4.  Constipation.  Plan:  1.  MRI/MRCP. 2.  If  positive  CBD stone, will consider ERCP.  If no CBD stone, will need work-up for elevated liver enzymes. 3.  [facility] will revisit tomorrow.   LOS: 0 days   Abbygayle Helfand M  07/04/2018, 2:49 PM  Cell [phone] If no answer or after 5 PM call [phone]

## 2018-07-04 NOTE — ED Provider Notes (Addendum)
[facility] EMERGENCY DEPARTMENT Provider Note   CSN: [id number] Arrival date & time: 07/04/18  6948     History   Chief Complaint Chief Complaint  Patient presents with  . Weakness    HPI Heidi Dominguez is a 84 y.o. female.  HPI  84 year old female with history of heart failure with preserved EF, DM, s/p cholecystectomy here with weakness, nausea, and abdominal pain.  The patient reports that she has been progressively more weak over the last 4 to 5 days.  She has a history of intermittent, recurrent nausea and vomiting since her cholecystectomy years ago, and states that she had an episode recently.  However, she normally recovers from these, and has not recovered.  She is had progressively worsening diffuse abdominal pain, worse in the upper part of her abdomen, along with loose, but not necessarily diarrheal, stools.  She has had subjective chills but no documented fevers.  She said difficulty getting around due to her weakness and reportedly slid out of her chair yesterday, onto the ground.  She is adamant she did not hit her head.  She said little to no appetite.  She subsequent presents for evaluation.  Denies any urinary symptoms.  Denies any flank pain. Pain seems worse with eating.  Past Medical History:  Diagnosis Date  . Chronic diastolic CHF (congestive heart failure) ([place])   . Complication of anesthesia    I have a hard time waking up"  . Degenerative joint disease of shoulder region   . Diabetes mellitus   . GERD (gastroesophageal reflux disease)   . High cholesterol   . HOH (hard of hearing)   . Hypertension   . Osteoarthritis   . Pneumonia     Patient Active Problem List   Diagnosis Date Noted  . Upper airway cough syndrome 03/13/2018  . Sensorineural hearing loss (SNHL), bilateral 12/04/2016  . Post-nasal drainage 03/14/2016  . Presbycusis of both ears 03/14/2016  . Bilateral hearing loss 10/06/2015  . Bilateral impacted cerumen  10/06/2015  . 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 Subjective tinnitus of both ears 10/06/2015  . Choledocholithiasis with chronic cholecystitis, nonobstructing 04/11/2015  . Pre-operative cardiovascular examination, high risk surgery   . Epigastric abdominal pain 04/08/2015  . Acute gallstone pancreatitis s/p lap chole w [facility] 04/11/2015 04/08/2015  . Diabetes mellitus type 2, controlled ([place]) 04/08/2015  . Gallstone pancreatitis 04/08/2015  . Mitral regurgitation 04/08/2015  . Chronic diastolic CHF (congestive heart failure) ([place]) 04/08/2015  . Dyspnea 03/31/2015  . Ischemic colitis ([place]) 08/09/2012  . Colitis 08/07/2012  . Rectal bleeding 08/07/2012  . Weakness 09/24/2011  . Bradycardia 09/24/2011  . UTI (lower urinary tract infection) 09/24/2011  . Hematochezia 09/24/2011  . DM (diabetes mellitus) ([place]) 09/24/2011  . Essential hypertension 09/24/2011  . Dyslipidemia 09/24/2011    Past Surgical History:  Procedure Laterality Date  . ABDOMINAL HYSTERECTOMY    . BACK SURGERY    . cataracts     bilateral  . KNEE SURGERY    . LAPAROSCOPIC CHOLECYSTECTOMY SINGLE SITE WITH INTRAOPERATIVE CHOLANGIOGRAM N/A 04/11/2015   Procedure: LAPAROSCOPIC LYSIS OF ADHESIONS, LAPAROSCOPIC CHOLECYSTECTOMY WITH INTRAOPERATIVE CHOLANGIOGRAM;  Surgeon: Michael Boston, MD;  Location: [facility] ORS;  Service: General;  Laterality: N/A;  . SHOULDER SURGERY       OB History   None      Home Medications    Prior to Admission medications   Medication Sig Start Date End Date Taking? Authorizing Provider  albuterol (  PROVENTIL HFA;VENTOLIN HFA) 108 (90 Base) MCG/ACT inhaler Inhale into the lungs every 6 (six) hours as needed for wheezing or shortness of breath.   Yes [provider]  albuterol (PROVENTIL) (2.5 MG/3ML) 0.083% nebulizer solution Take 3 mLs (2.5 mg total) by nebulization every 6 (six) hours as needed for wheezing or shortness of breath. 03/13/18  Yes Tanda Rockers, MD    amLODipine (NORVASC) 10 MG tablet Take 1 tablet (10 mg total) by mouth daily. Patient taking differently: Take 5 mg by mouth daily.  04/13/15  Yes Hongalgi, Lenis Dickinson, MD  amLODipine (NORVASC) 5 MG tablet Take 5 mg by mouth daily.  05/05/18  Yes [provider]  aspirin 81 MG tablet Take 81 mg by mouth daily.   Yes [provider]  budesonide-formoterol (SYMBICORT) 160-4.5 MCG/ACT inhaler Inhale 2 puffs into the lungs daily.  04/13/15  Yes [provider]  famotidine (PEPCID) 20 MG tablet Take 20 mg by mouth at bedtime.   Yes [provider]  Ferrous Gluconate (IRON) 240 (27 FE) MG TABS Take 1 tablet by mouth daily.   Yes [provider]  furosemide (LASIX) 40 MG tablet Take 40 mg by mouth daily.    Yes [provider]  JANUVIA 50 MG tablet Take 50 mg by mouth daily.  03/11/15  Yes [provider]  loratadine (CLARITIN) 10 MG tablet Take 10 mg by mouth daily.   Yes [provider]  potassium chloride (K-DUR,KLOR-CON) 10 MEQ tablet Take 10 mEq by mouth daily.  03/22/15  Yes [provider]  vitamin B-12 (CYANOCOBALAMIN) 500 MCG tablet Take 1,000 mcg by mouth daily.    Yes [provider]  pantoprazole (PROTONIX) 40 MG tablet TAKE 1 TABLET(40 MG) BY MOUTH DAILY 30 TO 60 MINUTES BEFORE FIRST MEAL OF THE DAY Patient not taking: Reported on 07/04/2018 06/07/18   Tanda Rockers, MD    Family History Family History  Problem Relation Age of Onset  . Hypertension Mother   . Diabetes Mellitus I Mother   . Hypertension Father     Social History Social History   Tobacco Use  . Smoking status: Never Smoker  . Smokeless tobacco: Never Used  Substance Use Topics  . Alcohol use: No  . Drug use: No     Allergies   Catapres [clonidine hcl]; Covera-hs [verapamil hcl]; Other; Sulfa antibiotics; Sulfamethoxazole; Verapamil; and Sulfasalazine   Review of Systems Review of Systems  Constitutional: Positive for  fatigue. Negative for chills and fever.  HENT: Negative for congestion and rhinorrhea.   Eyes: Negative for visual disturbance.  Respiratory: Negative for cough, shortness of breath and wheezing.   Cardiovascular: Negative for chest pain and leg swelling.  Gastrointestinal: Positive for abdominal pain, nausea and vomiting. Negative for diarrhea.  Genitourinary: Negative for dysuria and flank pain.  Musculoskeletal: Negative for neck pain and neck stiffness.  Skin: Negative for rash and wound.  Allergic/Immunologic: Negative for immunocompromised state.  Neurological: Positive for weakness and light-headedness. Negative for syncope and headaches.  All other systems reviewed and are negative.    Physical Exam Updated Vital Signs BP (!) 136/55   Pulse 97   Temp 98.8 F (37.1 C) (Oral)   Resp (!) 21   Ht 5\' 5"  (1.651 m)   Wt 79.4 kg   SpO2 97%   BMI 29.12 kg/m   Physical Exam  Constitutional: She is oriented to person, place, and time. She appears well-developed and well-nourished. No distress.  HENT:  Head: Normocephalic and atraumatic.  Dry MM  Eyes: Pupils are equal, round, and reactive to light. Conjunctivae are normal.  Neck: Neck supple.  Cardiovascular: Normal rate, regular rhythm and normal heart sounds. Exam reveals no friction rub.  No murmur heard. Pulmonary/Chest: Effort normal and breath sounds normal. No respiratory distress. She has no wheezes. She has no rales.  Abdominal: Soft. Normal appearance. She exhibits no distension. There is generalized tenderness. There is no rigidity.  Musculoskeletal: She exhibits no edema.  Neurological: She is alert and oriented to person, place, and time. She exhibits normal muscle tone.  Skin: Skin is warm. Capillary refill takes less than 2 seconds.  Psychiatric: She has a normal mood and affect.  Nursing note and vitals reviewed.    ED Treatments / Results  Labs (all labs ordered are listed, but only abnormal results are  displayed) Labs Reviewed  BASIC METABOLIC PANEL - Abnormal; Notable for the following components:      Result Value   Glucose, Bld 225 (*)    GFR calc non Af Amer 47 (*)    GFR calc Af Amer 54 (*)    All other components within normal limits  CBC - Abnormal; Notable for the following components:   WBC 11.0 (*)    HCT 46.7 (*)    Platelets 109 (*)    All other components within normal limits  HEPATIC FUNCTION PANEL - Abnormal; Notable for the following components:   Albumin 3.1 (*)    AST 2,705 (*)    ALT 1,536 (*)    Alkaline Phosphatase 249 (*)    Total Bilirubin 2.7 (*)    Bilirubin, Direct 1.9 (*)    All other components within normal limits  LIPASE, BLOOD - Abnormal; Notable for the following components:   Lipase 352 (*)    All other components within normal limits  I-STAT CG4 LACTIC ACID, ED - Abnormal; Notable for the following components:   Lactic Acid, Venous 2.41 (*)    All other components within normal limits  I-STAT CHEM 8, ED - Abnormal; Notable for the following components:   Glucose, Bld 223 (*)    Calcium, Ion 1.08 (*)    Hemoglobin 16.0 (*)    HCT 47.0 (*)    All other components within normal limits  I-STAT CG4 LACTIC ACID, ED - Abnormal; Notable for the following components:   Lactic Acid, Venous 3.29 (*)    All other components within normal limits  CULTURE, BLOOD (ROUTINE X 2)  CULTURE, BLOOD (ROUTINE X 2)  URINALYSIS, ROUTINE W REFLEX MICROSCOPIC    EKG EKG Interpretation  Date/Time:  Thursday July 04 2018 03:39:48 EST Ventricular Rate:  103 PR Interval:    QRS Duration: 141 QT Interval:  377 QTC Calculation: 494 R Axis:   -83 Text Interpretation:  Sinus tachycardia Ventricular premature complex Left bundle branch block Since last EKG, LBBB is now present PVCs are now evident Confirmed by Duffy Bruce [phone]) on 07/04/2018 6:28:06 AM Also confirmed by Duffy Bruce [phone]), editor Philomena Doheny [phone])  on 07/04/2018 7:07:50  AM   Radiology Dg Chest 2 View  Result Date: 07/04/2018 CLINICAL DATA:  generalized weakness that has been getting worse over the past four days with nausea and abdominal pain. Pt has a fall today where she slid out of bed, no injury EXAM: CHEST - 2 VIEW COMPARISON:  03/13/2018 FINDINGS: Mild cardiac enlargement. No vascular congestion. No edema or consolidation in the lungs. No blunting of costophrenic angles.  No pneumothorax. Esophageal hiatal hernia behind the heart. Calcification of the aorta. Postoperative changes in both shoulders. Degenerative changes in the spine. Anterior compression deformities of lower thoracic and upper lumbar vertebrae without change. IMPRESSION: Cardiac enlargement. No evidence of active pulmonary disease. Electronically Signed   By: Lucienne Capers M.D.   On: 07/04/2018 04:43    Procedures .Critical Care Performed by: Duffy Bruce, MD Authorized by: Duffy Bruce, MD   Critical care provider statement:    Critical care time (minutes):  45   Critical care time was exclusive of:  Separately billable procedures and treating other patients and teaching time   Critical care was necessary to treat or prevent imminent or life-threatening deterioration of the following conditions:  Cardiac failure, circulatory failure and sepsis   Critical care was time spent personally by me on the following activities:  Development of treatment plan with patient or surrogate, discussions with consultants, evaluation of patient's response to treatment, examination of patient, obtaining history from patient or surrogate, ordering and performing treatments and interventions, ordering and review of laboratory studies, ordering and review of radiographic studies, pulse oximetry, re-evaluation of patient's condition and review of old charts   I assumed direction of critical care for this patient from another provider in my specialty: no     (including critical care time)  Medications  Ordered in ED Medications  sodium chloride 0.9 % bolus 500 mL (500 mLs Intravenous New Bag/Given 07/04/18 0738)  sodium chloride 0.9 % bolus 1,000 mL (0 mLs Intravenous Stopped 07/04/18 0556)  sodium chloride 0.9 % bolus 1,000 mL (0 mLs Intravenous Stopped 07/04/18 0720)  piperacillin-tazobactam (ZOSYN) IVPB 3.375 g (0 g Intravenous Stopped 07/04/18 0720)  iopamidol (ISOVUE-300) 61 % injection 80 mL (80 mLs Intravenous Contrast Given 07/04/18 0722)     Initial Impression / Assessment and Plan / ED Course  I have reviewed the triage vital signs and the nursing notes.  Pertinent labs & imaging results that were available during my care of the patient were reviewed by me and considered in my medical decision making (see chart for details).    84 year old female here with nausea, vomiting, and generalized weakness.  Patient is afebrile and nontoxic on exam, but lab work is concerning for leukocytosis, lactic acidosis, and market elevation in LFTs and lipase.  Based on clinical history and lab, concern for possible cholangitis.  Patient was given IV fluids, Zosyn, and activated as a code sepsis, though her vitals remained stable.  She is aware of her lab findings and need for admission.  Current plan is to obtain CT scan to further assess etiology, admit to medicine with possible GI consult.  Final Clinical Impressions(s) / ED Diagnoses   Final diagnoses:  Sepsis, due to unspecified organism, unspecified whether acute organ dysfunction present ([place])  Transaminitis  Elevated lipase  Lactic acidosis      Duffy Bruce, MD 07/04/18 1607    Duffy Bruce, MD 07/04/18 1935

## 2018-07-05 DIAGNOSIS — I5032 Chronic diastolic (congestive) heart failure: Secondary | ICD-10-CM

## 2018-07-05 DIAGNOSIS — N390 Urinary tract infection, site not specified: Secondary | ICD-10-CM

## 2018-07-05 DIAGNOSIS — N183 Chronic kidney disease, stage 3 (moderate): Secondary | ICD-10-CM

## 2018-07-05 LAB — IRON AND TIBC
IRON: 25 ug/dL — AB (ref 28–170)
Saturation Ratios: 15 % (ref 10.4–31.8)
TIBC: 171 ug/dL — AB (ref 250–450)
UIBC: 146 ug/dL

## 2018-07-05 LAB — COMPREHENSIVE METABOLIC PANEL
ALT: 1011 U/L — ABNORMAL HIGH (ref 0–44)
ANION GAP: 7 (ref 5–15)
AST: 924 U/L — ABNORMAL HIGH (ref 15–41)
Albumin: 2.5 g/dL — ABNORMAL LOW (ref 3.5–5.0)
Alkaline Phosphatase: 193 U/L — ABNORMAL HIGH (ref 38–126)
BUN: 8 mg/dL (ref 8–23)
CO2: 27 mmol/L (ref 22–32)
Calcium: 8.7 mg/dL — ABNORMAL LOW (ref 8.9–10.3)
Chloride: 105 mmol/L (ref 98–111)
Creatinine, Ser: 0.85 mg/dL (ref 0.44–1.00)
GFR calc Af Amer: >60 mL/min (ref >60)
GFR calc non Af Amer: 56 mL/min — ABNORMAL LOW (ref >60)
GLUCOSE: 111 mg/dL — AB (ref 70–99)
POTASSIUM: 3 mmol/L — AB (ref 3.5–5.1)
SODIUM: 139 mmol/L (ref 135–145)
Total Bilirubin: 3.4 mg/dL — ABNORMAL HIGH (ref 0.3–1.2)
Total Protein: 5.6 g/dL — ABNORMAL LOW (ref 6.5–8.1)

## 2018-07-05 LAB — CBC
HCT: 40.4 % (ref 36.0–46.0)
Hemoglobin: 12.8 g/dL (ref 12.0–15.0)
MCH: 29.3 pg (ref 26.0–34.0)
MCHC: 31.7 g/dL (ref 30.0–36.0)
MCV: 92.4 fL (ref 80.0–100.0)
Platelets: 100 10*3/uL — ABNORMAL LOW (ref 150–400)
RBC: 4.37 MIL/uL (ref 3.87–5.11)
RDW: 14.3 % (ref 11.5–15.5)
WBC: 10.3 10*3/uL (ref 4.0–10.5)
nRBC: 0 % (ref 0.0–0.2)

## 2018-07-05 LAB — HEPATIC FUNCTION PANEL
ALBUMIN: 2.6 g/dL — AB (ref 3.5–5.0)
ALT: 938 U/L — AB (ref 0–44)
AST: 772 U/L — ABNORMAL HIGH (ref 15–41)
Alkaline Phosphatase: 188 U/L — ABNORMAL HIGH (ref 38–126)
BILIRUBIN INDIRECT: 2.2 mg/dL — AB (ref 0.3–0.9)
Bilirubin, Direct: 1.9 mg/dL — ABNORMAL HIGH (ref 0.0–0.2)
TOTAL PROTEIN: 5.8 g/dL — AB (ref 6.5–8.1)
Total Bilirubin: 4.1 mg/dL — ABNORMAL HIGH (ref 0.3–1.2)

## 2018-07-05 LAB — GLUCOSE, CAPILLARY
GLUCOSE-CAPILLARY: 111 mg/dL — AB (ref 70–99)
GLUCOSE-CAPILLARY: 109 mg/dL — AB (ref 70–99)
Glucose-Capillary: 85 mg/dL (ref 70–99)
Glucose-Capillary: 129 mg/dL — ABNORMAL HIGH (ref 70–99)

## 2018-07-05 LAB — LACTIC ACID, PLASMA: Lactic Acid, Venous: 0.8 mmol/L (ref 0.5–1.9)

## 2018-07-05 LAB — FERRITIN: Ferritin: 761 ng/mL — ABNORMAL HIGH (ref 11–307)

## 2018-07-05 LAB — LIPASE, BLOOD: Lipase: 179 U/L — ABNORMAL HIGH (ref 11–51)

## 2018-07-05 MED ORDER — SODIUM CHLORIDE 0.9 % IV SOLN
INTRAVENOUS | Status: DC
  Administered 2018-07-05: 17:00:00 via INTRAVENOUS

## 2018-07-05 MED ORDER — SENNOSIDES-DOCUSATE SODIUM 8.6-50 MG PO TABS
2.0000 | ORAL_TABLET | Freq: Two times a day (BID) | ORAL | Status: DC
  Administered 2018-07-05 – 2018-07-06 (×3): 2 via ORAL
  Filled 2018-07-05 (×3): qty 2

## 2018-07-05 MED ORDER — VITAMIN B-12 1000 MCG PO TABS
1000.0000 ug | ORAL_TABLET | Freq: Every day | ORAL | Status: DC
  Administered 2018-07-06: 1000 ug via ORAL
  Filled 2018-07-05: qty 1

## 2018-07-05 MED ORDER — POLYETHYLENE GLYCOL 3350 17 G PO PACK
34.0000 g | PACK | Freq: Once | ORAL | Status: AC
  Administered 2018-07-05: 34 g via ORAL
  Filled 2018-07-05: qty 2

## 2018-07-05 MED ORDER — LORATADINE 10 MG PO TABS
10.0000 mg | ORAL_TABLET | Freq: Every day | ORAL | Status: DC
  Administered 2018-07-05 – 2018-07-06 (×2): 10 mg via ORAL
  Filled 2018-07-05 (×2): qty 1

## 2018-07-05 MED ORDER — SODIUM CHLORIDE 0.9 % IV SOLN
2.0000 g | INTRAVENOUS | Status: DC
  Administered 2018-07-05: 2 g via INTRAVENOUS
  Filled 2018-07-05 (×2): qty 20

## 2018-07-05 MED ORDER — METRONIDAZOLE 500 MG PO TABS
500.0000 mg | ORAL_TABLET | Freq: Three times a day (TID) | ORAL | Status: DC
  Administered 2018-07-05 – 2018-07-06 (×3): 500 mg via ORAL
  Filled 2018-07-05 (×3): qty 1

## 2018-07-05 MED ORDER — FAMOTIDINE 20 MG PO TABS
20.0000 mg | ORAL_TABLET | Freq: Every day | ORAL | Status: DC
  Administered 2018-07-05: 20 mg via ORAL
  Filled 2018-07-05: qty 1

## 2018-07-05 NOTE — Progress Notes (Signed)
Subjective: No abdominal pain.  Objective: Vital signs in last 24 hours: Temp:  [98 F (36.7 C)-99.4 F (37.4 C)] 98 F (36.7 C) (11/22 1505) Pulse Rate:  [75-94] 81 (11/22 1505) Resp:  [16-19] 18 (11/22 1505) BP: (115-172)/(51-97) 172/69 (11/22 1505) SpO2:  [94 %-95 %] 94 % (11/22 1505) Weight change:     PE: GEN:  Younger-appearing than stated age, NAD ABD:  Soft, non-tender  Lab Results: CBC    Component Value Date/Time   WBC 10.3 07/05/2018 0226   RBC 4.37 07/05/2018 0226   HGB 12.8 07/05/2018 0226   HCT 40.4 07/05/2018 0226   PLT 100 (L) 07/05/2018 0226   MCV 92.4 07/05/2018 0226   MCH 29.3 07/05/2018 0226   MCHC 31.7 07/05/2018 0226   RDW 14.3 07/05/2018 0226   LYMPHSABS 1.5 03/13/2018 1113   MONOABS 0.9 03/13/2018 1113   EOSABS 0.1 03/13/2018 1113   BASOSABS 0.1 03/13/2018 1113   CMP     Component Value Date/Time   NA 139 07/05/2018 0226   K 3.0 (L) 07/05/2018 0226   CL 105 07/05/2018 0226   CO2 27 07/05/2018 0226   GLUCOSE 111 (H) 07/05/2018 0226   BUN 8 07/05/2018 0226   CREATININE 0.85 07/05/2018 0226   CALCIUM 8.7 (L) 07/05/2018 0226   PROT 5.8 (L) 07/05/2018 0817   ALBUMIN 2.6 (L) 07/05/2018 0817   AST 772 (H) 07/05/2018 0817   ALT 938 (H) 07/05/2018 0817   ALKPHOS 188 (H) 07/05/2018 0817   BILITOT 4.1 (H) 07/05/2018 0817   GFRNONAA 56 (L) 07/05/2018 0226   GFRAA >60 07/05/2018 0226     Studies/Results: MRCP:  Chronically dilated bile duct without obvious mass or bile duct stone  Assessment:  1.  Dilated bile duct; no significant change cf. Intraoperative cholangiogram 2016. 2.  Elevated LFTs.  3.  Abdominal pain, periumbilical, intermittent; improves after bowel movement. 4.  Constipation.  Plan:  1.  Serologies for abnormal LFTs. 2.  [facility] will follow.  Given age/comorbidities, would hope for conservative management.   Landry Dyke 07/05/2018, 3:23 PM   Cell [phone] If no answer or after 5 PM call  [phone]

## 2018-07-05 NOTE — Progress Notes (Signed)
PROGRESS NOTE                                                                                                                                                                                                             Patient Demographics:    Heidi Dominguez, is a 84 y.o. female, DOB - 11-08-1920, NLZ:[id number]  Admit date - 07/04/2018   Admitting Physician Karmen Bongo, MD  Outpatient Primary MD for the patient is Lajean Manes, MD  LOS - 1   Chief Complaint  Patient presents with  . Weakness       Brief Narrative    84 y.o. female with medical history significant of HTN; HLD; DM; macular degeneration; and chronic diastolic CHF presenting with weakness, work-up significant for elevated LFTs, nonspecific bile duct dilation , She is s/'p cholecystectomy.  Work-up was significant for weighted LFTs, abnormal urine analysis, imaging initially suspicious for nonspecific biliary duct dilation.   Subjective:    Heidi Dominguez today has, No headache, No chest pain, No abdominal pain -ports she is feeling better, still complains of abdominal pain.   Assessment  & Plan :    Principal Problem:   Elevated LFTs Active Problems:   Acute lower UTI   Essential hypertension   Dyslipidemia   Diabetes mellitus type 2, controlled (HCC)   Chronic diastolic CHF (congestive heart failure) (HCC)   CKD (chronic kidney disease) stage 3, GFR 30-59 ml/min (HCC)   Abdominal pain/elevated LFTs -Nonspecific complaints, with intrahepatic bile diet dilation on admission, GI input appreciated, MRCP no evidence of obstructing lesions, bile duct dilation most likely chronic in the setting of status post cholecystectomy. -Most likely related to UTI. -LFTs are trending down, but bili is trending up, continue to monitor, will check hepatitis panel -Lipase mildly elevated on admission, trending down, no epigastric pain -Empirically on  Rocephin/Flagyl  UTI/bacteremia -Blood culture and urine culture showing E. coli, continue with Rocephin  HTN -Continue Norvasc  DM -hold Glucophage -Cover with moderate-scale SSI  Chronic diastolic CHF -9/02 echo with preserved EF and grade 1 diastolic dysfunction -Appears to be compensated at this time, continue to hold Lasix, monitor closely as on gentle hydration  Stage 3 CKD -Appears to be stable at this time   Code Status : Full  Family Communication  : son at bedside  Disposition Plan  :  home when stable  Consults  :  GI  Procedures  : none  DVT Prophylaxis  :  SCD, no chemical GI prophylaxis due to thrombocytopenia  Lab Results  Component Value Date   PLT 100 (L) 07/05/2018    Antibiotics  :    Anti-infectives (From admission, onward)   Start     Dose/Rate Route Frequency Ordered Stop   07/05/18 1400  metroNIDAZOLE (FLAGYL) tablet 500 mg     500 mg Oral Every 8 hours 07/05/18 0919     07/05/18 1200  cefTRIAXone (ROCEPHIN) 2 g in sodium chloride 0.9 % 100 mL IVPB     2 g 200 mL/hr over 30 Minutes Intravenous Every 24 hours 07/05/18 0919     07/04/18 1200  piperacillin-tazobactam (ZOSYN) IVPB 3.375 g  Status:  Discontinued     3.375 g 12.5 mL/hr over 240 Minutes Intravenous Every 8 hours 07/04/18 0950 07/05/18 0919   07/04/18 0545  piperacillin-tazobactam (ZOSYN) IVPB 3.375 g     3.375 g 100 mL/hr over 30 Minutes Intravenous  Once 07/04/18 0530 07/04/18 0720        Objective:   Vitals:   07/04/18 1530 07/04/18 1711 07/04/18 2131 07/05/18 0526  BP: (!) 115/97 (!) 148/68 (!) 130/56 (!) 142/51  Pulse: 94 87 82 75  Resp: 18 16 18 19   Temp:  99.4 F (37.4 C) 98.4 F (36.9 C) 98.9 F (37.2 C)  TempSrc:   Oral Oral  SpO2: 95% 94% 94% 95%  Weight:      Height:        Wt Readings from Last 3 Encounters:  07/04/18 79.4 kg  04/25/18 79.7 kg  03/27/18 79.9 kg     Intake/Output Summary (Last 24 hours) at 07/05/2018 1404 Last data filed at  07/05/2018 0602 Gross per 24 hour  Intake 340 ml  Output 750 ml  Net -410 ml     Physical Exam  Awake Alert, Oriented X 3, No new F.N deficits, Normal affect  Symmetrical Chest wall movement, Good air movement bilaterally, CTAB RRR,No Gallops,Rubs or new Murmurs, No Parasternal Heave +ve B.Sounds, Abd Soft, she does have mild suprapubic tenderness, no right upper quadrant tenderness, no rebound - guarding or rigidity. No Cyanosis, Clubbing or edema, No new Rash or bruise      Data Review:    CBC Recent Labs  Lab 07/04/18 0406 07/04/18 0427 07/05/18 0226  WBC 11.0*  --  10.3  HGB 14.0 16.0* 12.8  HCT 46.7* 47.0* 40.4  PLT 109*  --  100*  MCV 94.3  --  92.4  MCH 28.3  --  29.3  MCHC 30.0  --  31.7  RDW 13.8  --  14.3    Chemistries  Recent Labs  Lab 07/04/18 0406 07/04/18 0427 07/05/18 0226 07/05/18 0817  NA 137 136 139  --   K 3.8 3.9 3.0*  --   CL 99 98 105  --   CO2 28  --  27  --   GLUCOSE 225* 223* 111*  --   BUN 13 17 8   --   CREATININE 0.99 0.90 0.85  --   CALCIUM 9.3  --  8.7*  --   AST 2,705*  --  924* 772*  ALT 1,536*  --  1,011* 938*  ALKPHOS 249*  --  193* 188*  BILITOT 2.7*  --  3.4* 4.1*   ------------------------------------------------------------------------------------------------------------------ No results for input(s): CHOL, HDL, LDLCALC, TRIG, CHOLHDL, LDLDIRECT in the last 72 hours.  Lab Results  Component Value Date   HGBA1C 6.3 (H) 07/04/2018   ------------------------------------------------------------------------------------------------------------------ No results for input(s): TSH, T4TOTAL, T3FREE, THYROIDAB in the last 72 hours.  Invalid input(s): FREET3 ------------------------------------------------------------------------------------------------------------------ No results for input(s): VITAMINB12, FOLATE, FERRITIN, TIBC, IRON, RETICCTPCT in the last 72 hours.  Coagulation profile No results for input(s): INR,  PROTIME in the last 168 hours.  No results for input(s): DDIMER in the last 72 hours.  Cardiac Enzymes No results for input(s): CKMB, TROPONINI, MYOGLOBIN in the last 168 hours.  Invalid input(s): CK ------------------------------------------------------------------------------------------------------------------ No results found for: BNP  Inpatient Medications  Scheduled Meds: . amLODipine  5 mg Oral Daily  . aspirin  81 mg Oral Daily  . famotidine  20 mg Oral QHS  . insulin aspart  0-15 Units Subcutaneous TID WC  . loratadine  10 mg Oral Daily  . metroNIDAZOLE  500 mg Oral Q8H  . mometasone-formoterol  2 puff Inhalation BID  . [START ON 07/06/2018] vitamin B-12  1,000 mcg Oral Daily   Continuous Infusions: . cefTRIAXone (ROCEPHIN)  IV 2 g (07/05/18 1142)   PRN Meds:.acetaminophen **OR** acetaminophen, albuterol, hydrALAZINE, morphine injection, ondansetron **OR** ondansetron (ZOFRAN) IV  Micro Results Recent Results (from the past 240 hour(s))  Blood culture (routine x 2)     Status: Abnormal (Preliminary result)   Collection Time: 07/04/18  5:57 AM  Result Value Ref Range Status   Specimen Description BLOOD RIGHT ANTECUBITAL  Final   Special Requests   Final    BOTTLES DRAWN AEROBIC AND ANAEROBIC Blood Culture adequate volume   Culture  Setup Time   Final    ANAEROBIC BOTTLE ONLY GRAM NEGATIVE RODS Organism ID to follow CRITICAL RESULT CALLED TO, READ BACK BY AND VERIFIED WITH: A MEYER PHARMD 07/04/18 2143 JDW    Culture (A)  Final    ESCHERICHIA COLI SUSCEPTIBILITIES TO FOLLOW Performed at [facility], [place] [address] [place], [place] [address]    Report Status PENDING  Incomplete  Blood Culture ID Panel (Reflexed)     Status: Abnormal   Collection Time: 07/04/18  5:57 AM  Result Value Ref Range Status   Enterococcus species NOT DETECTED NOT DETECTED Final   Vancomycin resistance NOT DETECTED NOT DETECTED Final   Listeria monocytogenes NOT DETECTED NOT  DETECTED Final   Staphylococcus species NOT DETECTED NOT DETECTED Final   Staphylococcus aureus (BCID) NOT DETECTED NOT DETECTED Final   Methicillin resistance NOT DETECTED NOT DETECTED Final   Streptococcus species NOT DETECTED NOT DETECTED Final   Streptococcus agalactiae NOT DETECTED NOT DETECTED Final   Streptococcus pneumoniae NOT DETECTED NOT DETECTED Final   Streptococcus pyogenes NOT DETECTED NOT DETECTED Final   Acinetobacter baumannii NOT DETECTED NOT DETECTED Final   Enterobacteriaceae species DETECTED (A) NOT DETECTED Final    Comment: CRITICAL RESULT CALLED TO, READ BACK BY AND VERIFIED WITH: A MEYER PHARMD 07/04/18 2146 JDW Enterobacteriaceae represent a large family of gram-negative bacteria, not a single organism.    Enterobacter cloacae complex NOT DETECTED NOT DETECTED Final   Escherichia coli DETECTED (A) NOT DETECTED Final    Comment: CRITICAL RESULT CALLED TO, READ BACK BY AND VERIFIED WITH: A MEYER PHARMD 07/04/18 2146 JDW    Klebsiella oxytoca NOT DETECTED NOT DETECTED Final   Klebsiella pneumoniae NOT DETECTED NOT DETECTED Final   Proteus species NOT DETECTED NOT DETECTED Final   Serratia marcescens NOT DETECTED NOT DETECTED Final   Carbapenem resistance NOT DETECTED NOT DETECTED Final   Haemophilus influenzae  NOT DETECTED NOT DETECTED Final   Neisseria meningitidis NOT DETECTED NOT DETECTED Final   Pseudomonas aeruginosa NOT DETECTED NOT DETECTED Final   Candida albicans NOT DETECTED NOT DETECTED Final   Candida glabrata NOT DETECTED NOT DETECTED Final   Candida krusei NOT DETECTED NOT DETECTED Final   Candida parapsilosis NOT DETECTED NOT DETECTED Final   Candida tropicalis NOT DETECTED NOT DETECTED Final    Comment: Performed at [facility], [place] [address] [place], [place] [address]  Urine culture     Status: Abnormal (Preliminary result)   Collection Time: 07/04/18  7:53 AM  Result Value Ref Range Status   Specimen Description URINE, CLEAN CATCH   Final   Special Requests   Final    NONE Performed at [facility], [place] [address], [place],  [address]    Culture >=100,000 COLONIES/mL ESCHERICHIA COLI (A)  Final   Report Status PENDING  Incomplete    Radiology Reports Dg Chest 2 View  Result Date: 07/04/2018 CLINICAL DATA:  generalized weakness that has been getting worse over the past four days with nausea and abdominal pain. Pt has a fall today where she slid out of bed, no injury EXAM: CHEST - 2 VIEW COMPARISON:  03/13/2018 FINDINGS: Mild cardiac enlargement. No vascular congestion. No edema or consolidation in the lungs. No blunting of costophrenic angles. No pneumothorax. Esophageal hiatal hernia behind the heart. Calcification of the aorta. Postoperative changes in both shoulders. Degenerative changes in the spine. Anterior compression deformities of lower thoracic and upper lumbar vertebrae without change. IMPRESSION: Cardiac enlargement. No evidence of active pulmonary disease. Electronically Signed   By: Lucienne Capers M.D.   On: 07/04/2018 04:43   Ct Abdomen Pelvis W Contrast  Result Date: 07/04/2018 CLINICAL DATA:  84 year old female with abdominal pain, nausea vomiting. EXAM: CT ABDOMEN AND PELVIS WITH CONTRAST TECHNIQUE: Multidetector CT imaging of the abdomen and pelvis was performed using the standard protocol following bolus administration of intravenous contrast. CONTRAST:  79mL ISOVUE-300 IOPAMIDOL (ISOVUE-300) INJECTION 61% COMPARISON:  CT Abdomen and Pelvis 08/07/2012. FINDINGS: Lower chest: Chronic cardiomegaly, calcified coronary artery atherosclerosis, moderate to large gastric hiatal hernia. Increased lung base atelectasis greater on the left. No pericardial or pleural effusion. Hepatobiliary: Gallbladder is surgically absent, new since 2013. Intra- and extrahepatic biliary ductal enlargement may be postoperative. Otherwise negative liver. Pancreas: Negative. Spleen: Negative. Adrenals/Urinary Tract:  Stable mild adrenal gland thickening. Chronic right renal midpole benign cysts. Renal enhancement and contrast excretion is symmetric and within normal limits. No hydronephrosis or hydroureter. There is prominent gas within the urinary bladder (series 3, image 74). No perivesical stranding. Stomach/Bowel: Negative rectum. Decompressed distal sigmoid colon. Proximal sigmoid diverticulosis, with an indistinct appearance of the bowel wall in that segment but similar to that in 2013. No sigmoid mesenteric stranding. Occasional diverticula in the descending colon. Redundant transverse colon. The cecum is on a lax mesentery located in the right upper abdomen. There is no convincing large bowel inflammation. Negative negative terminal ileum. No dilated small bowel. No mesenteric stranding. Decompressed intra-abdominal stomach. Small volume of fluid in the gastric hernia. Negative duodenum. No free air, free fluid. Vascular/Lymphatic: Aortoiliac calcified atherosclerosis. Suboptimal intravascular contrast bolus but the major arterial structures in the abdomen and pelvis appear patent. Portal venous system is patent. No lymphadenopathy. Reproductive: Surgically absent uterus. Diminutive or absent ovaries. Other: No pelvic free fluid. Musculoskeletal: Chronic severe T12 and L1 compression fractures. Chronic previously augmented L3 compression fracture. Severe chronic lower thoracic and  lumbar spine degeneration with mild scoliosis. No acute osseous abnormality identified. IMPRESSION: 1. Gas within the urinary bladder suspicious for UTI unless explained by recent catheterization. 2. Cholecystectomy since the prior CT in 2013. Intra- and extrahepatic biliary ductal enlargement may be postoperative in nature, but if there is hyperbilirubinemia then consider distal CBD obstruction. 3. Sigmoid diverticulosis. Moderate to large gastric hiatal hernia. No convincing bowel inflammation. 4. Calcified coronary artery and Aortic  Atherosclerosis (ICD10-I70.0). Cardiomegaly. 5. Chronic lower thoracic and lumbar compression fractures and advanced spinal degeneration. Electronically Signed   By: Genevie Ann M.D.   On: 07/04/2018 07:53   Mr 3d Recon At Scanner  Result Date: 07/04/2018 CLINICAL DATA:  Biliary dilatation. Mildly elevated bilirubin. Elevated liver enzymes. EXAM: MRI ABDOMEN WITHOUT AND WITH CONTRAST (INCLUDING MRCP) TECHNIQUE: Multiplanar multisequence MR imaging of the abdomen was performed both before and after the administration of intravenous contrast. Heavily T2-weighted images of the biliary and pancreatic ducts were obtained, and three-dimensional MRCP images were rendered by post processing. CONTRAST:  80 mL Gadavist COMPARISON:  CT 07/04/2018 FINDINGS: Lower chest:  Lung bases are clear. Hepatobiliary: Mild intrahepatic duct dilatation. The extrahepatic ducts are moderately dilated. The common hepatic duct and common bile duct are tortuous. The common hepatic duct measures 14 mm and the common bile duct measures 11 mm (image 20/4). There is no filling defect within the common bile duct or common hepatic duct. No obstructing lesion identified at the ampullary region. The cystic duct remnant is also prominent. The pancreatic duct is prominent but within normal limits for age. No focal hepatic lesion. Pancreas: Mild fatty replaced through the pancreatic body and tail. Pancreatic duct is normal. No pancreatic mass. Spleen: Normal spleen. Adrenals/urinary tract: Adrenal glands and kidneys are normal. Stomach/Bowel: Stomach and limited of the small bowel is unremarkable Vascular/Lymphatic: Abdominal aortic normal caliber. No retroperitoneal periportal lymphadenopathy. Musculoskeletal: No aggressive osseous lesion IMPRESSION: 1. Dilated common bile duct and hepatic duct and mild intrahepatic duct dilatation. Favor chronic senescent biliary ductal dilatation following cholecystectomy. No obstructing intraluminal lesion. No  extraluminal lesion identified. 2. No significant pancreatic duct dilatation. Electronically Signed   By: Suzy Bouchard M.D.   On: 07/04/2018 21:38   Mr Abdomen Mrcp Moise Boring Contast  Result Date: 07/04/2018 CLINICAL DATA:  Biliary dilatation. Mildly elevated bilirubin. Elevated liver enzymes. EXAM: MRI ABDOMEN WITHOUT AND WITH CONTRAST (INCLUDING MRCP) TECHNIQUE: Multiplanar multisequence MR imaging of the abdomen was performed both before and after the administration of intravenous contrast. Heavily T2-weighted images of the biliary and pancreatic ducts were obtained, and three-dimensional MRCP images were rendered by post processing. CONTRAST:  80 mL Gadavist COMPARISON:  CT 07/04/2018 FINDINGS: Lower chest:  Lung bases are clear. Hepatobiliary: Mild intrahepatic duct dilatation. The extrahepatic ducts are moderately dilated. The common hepatic duct and common bile duct are tortuous. The common hepatic duct measures 14 mm and the common bile duct measures 11 mm (image 20/4). There is no filling defect within the common bile duct or common hepatic duct. No obstructing lesion identified at the ampullary region. The cystic duct remnant is also prominent. The pancreatic duct is prominent but within normal limits for age. No focal hepatic lesion. Pancreas: Mild fatty replaced through the pancreatic body and tail. Pancreatic duct is normal. No pancreatic mass. Spleen: Normal spleen. Adrenals/urinary tract: Adrenal glands and kidneys are normal. Stomach/Bowel: Stomach and limited of the small bowel is unremarkable Vascular/Lymphatic: Abdominal aortic normal caliber. No retroperitoneal periportal lymphadenopathy. Musculoskeletal: No aggressive osseous lesion IMPRESSION: 1.  Dilated common bile duct and hepatic duct and mild intrahepatic duct dilatation. Favor chronic senescent biliary ductal dilatation following cholecystectomy. No obstructing intraluminal lesion. No extraluminal lesion identified. 2. No significant  pancreatic duct dilatation. Electronically Signed   By: Suzy Bouchard M.D.   On: 07/04/2018 21:38     Phillips Climes M.D on 07/05/2018 at 2:04 PM  Between 7am to 7pm - Pager - [phone]  After 7pm go to [email or web address] - password [facility]  [facility] -  Office  [phone]

## 2018-07-06 DIAGNOSIS — R945 Abnormal results of liver function studies: Secondary | ICD-10-CM

## 2018-07-06 LAB — BASIC METABOLIC PANEL
ANION GAP: 9 (ref 5–15)
BUN: 8 mg/dL (ref 8–23)
CALCIUM: 8.7 mg/dL — AB (ref 8.9–10.3)
CO2: 27 mmol/L (ref 22–32)
Chloride: 103 mmol/L (ref 98–111)
Creatinine, Ser: 0.81 mg/dL (ref 0.44–1.00)
GFR calc Af Amer: >60 mL/min (ref >60)
GFR, EST NON AFRICAN AMERICAN: 60 mL/min — AB (ref >60)
Glucose, Bld: 132 mg/dL — ABNORMAL HIGH (ref 70–99)
POTASSIUM: 2.9 mmol/L — AB (ref 3.5–5.1)
Sodium: 139 mmol/L (ref 135–145)

## 2018-07-06 LAB — MAGNESIUM: MAGNESIUM: 1.8 mg/dL (ref 1.7–2.4)

## 2018-07-06 LAB — CBC
HEMATOCRIT: 43.3 % (ref 36.0–46.0)
Hemoglobin: 13.5 g/dL (ref 12.0–15.0)
MCH: 28.7 pg (ref 26.0–34.0)
MCHC: 31.2 g/dL (ref 30.0–36.0)
MCV: 92.1 fL (ref 80.0–100.0)
NRBC: 0 % (ref 0.0–0.2)
PLATELETS: 86 10*3/uL — AB (ref 150–400)
RBC: 4.7 MIL/uL (ref 3.87–5.11)
RDW: 14.3 % (ref 11.5–15.5)
WBC: 7.4 10*3/uL (ref 4.0–10.5)

## 2018-07-06 LAB — HEPATITIS PANEL, ACUTE
HCV Ab: 0.1 s/co ratio (ref 0.0–0.9)
HCV Ab: <0.1 s/co ratio (ref 0.0–0.9)
HEP B C IGM: NEGATIVE
Hep A IgM: NEGATIVE
Hep A IgM: NEGATIVE
Hep B C IgM: NEGATIVE
Hepatitis B Surface Ag: NEGATIVE
Hepatitis B Surface Ag: NEGATIVE

## 2018-07-06 LAB — HEPATIC FUNCTION PANEL
ALT: 658 U/L — AB (ref 0–44)
AST: 335 U/L — ABNORMAL HIGH (ref 15–41)
Albumin: 2.3 g/dL — ABNORMAL LOW (ref 3.5–5.0)
Alkaline Phosphatase: 176 U/L — ABNORMAL HIGH (ref 38–126)
BILIRUBIN DIRECT: 0.8 mg/dL — AB (ref 0.0–0.2)
BILIRUBIN INDIRECT: 0.7 mg/dL (ref 0.3–0.9)
TOTAL PROTEIN: 5.7 g/dL — AB (ref 6.5–8.1)
Total Bilirubin: 1.5 mg/dL — ABNORMAL HIGH (ref 0.3–1.2)

## 2018-07-06 LAB — ANTI-SMOOTH MUSCLE ANTIBODY, IGG: F-Actin IgG: 11 Units (ref 0–19)

## 2018-07-06 LAB — IGG, IGA, IGM
IGG (IMMUNOGLOBIN G), SERUM: 1176 mg/dL (ref 700–1600)
IGM (IMMUNOGLOBULIN M), SRM: 62 mg/dL (ref 26–217)
IgA: 349 mg/dL (ref 64–422)

## 2018-07-06 LAB — CULTURE, BLOOD (ROUTINE X 2): Special Requests: ADEQUATE

## 2018-07-06 LAB — HEPATITIS B SURFACE ANTIBODY,QUALITATIVE: Hep B S Ab: NONREACTIVE

## 2018-07-06 LAB — GLUCOSE, CAPILLARY
GLUCOSE-CAPILLARY: 134 mg/dL — AB (ref 70–99)
GLUCOSE-CAPILLARY: 148 mg/dL — AB (ref 70–99)

## 2018-07-06 LAB — URINE CULTURE

## 2018-07-06 LAB — LIPASE, BLOOD: Lipase: 197 U/L — ABNORMAL HIGH (ref 11–51)

## 2018-07-06 LAB — CERULOPLASMIN: Ceruloplasmin: 26.4 mg/dL (ref 19.0–39.0)

## 2018-07-06 LAB — HEPATITIS B CORE ANTIBODY, TOTAL: Hep B Core Total Ab: NEGATIVE

## 2018-07-06 LAB — MITOCHONDRIAL ANTIBODIES: Mitochondrial M2 Ab, IgG: <20 Units (ref 0.0–20.0)

## 2018-07-06 LAB — HEPATITIS A ANTIBODY, TOTAL: Hep A Total Ab: POSITIVE — AB

## 2018-07-06 MED ORDER — POTASSIUM CHLORIDE CRYS ER 20 MEQ PO TBCR
40.0000 meq | EXTENDED_RELEASE_TABLET | Freq: Once | ORAL | Status: AC
  Administered 2018-07-06: 40 meq via ORAL
  Filled 2018-07-06: qty 2

## 2018-07-06 MED ORDER — POTASSIUM CHLORIDE 10 MEQ/100ML IV SOLN
10.0000 meq | INTRAVENOUS | Status: AC
  Administered 2018-07-06 (×4): 10 meq via INTRAVENOUS
  Filled 2018-07-06 (×4): qty 100

## 2018-07-06 MED ORDER — CEPHALEXIN 500 MG PO CAPS: 500.0000 mg | ORAL_CAPSULE | Freq: Three times a day (TID) | ORAL | 0 refills | Status: AC

## 2018-07-06 NOTE — Evaluation (Signed)
Physical Therapy Evaluation Patient Details Name: Heidi Dominguez MRN: [id number] DOB: 07/24/1921 Today's Date: 07/06/2018   History of Present Illness  Patient is a 84 y/o female who presents with nausea, weakness and abdominal pain. s/p fall at home. MRCP: dilated bile duct. PMh includes DM and HTN.   Clinical Impression  Patient presents with generalized weakness, deconditioning, impaired balance and impaired mobility s/p above. Tolerated transfers and gait training with min-mod A with use of rollator. Pt mod I PTA walking around house, assisting with meals as able. Pt has 2 aides that assist with ADLs/IADLs 24/7 per pt report. Was working with PT PTA. Will follow acutely to maximize independence and mobility prior to return home. Recommend continuing [place] services as pt weaker than baseline and continues to be a high fall risk.    Follow Up Recommendations Home health PT;Supervision/Assistance - 24 hour;Supervision for mobility/OOB    Equipment Recommendations  None recommended by PT    Recommendations for Other Services       Precautions / Restrictions Precautions Precautions: Fall Restrictions Weight Bearing Restrictions: No      Mobility  Bed Mobility Overal bed mobility: Needs Assistance Bed Mobility: Rolling;Sidelying to Sit Rolling: Min guard Sidelying to sit: HOB elevated;Min assist       General bed mobility comments: Increased time and heavy use of rail to get to EOB. Min A with trunk.  Transfers Overall transfer level: Needs assistance Equipment used: 4-wheeled walker Transfers: Sit to/from Stand Sit to Stand: Mod assist;+2 physical assistance         General transfer comment: Assist of 2 to power to standing. transferred to chair post ambulation.  Ambulation/Gait Ambulation/Gait assistance: Min assist Gait Distance (Feet): 60 Feet Assistive device: 4-wheeled walker Gait Pattern/deviations: Step-to pattern;Step-through pattern;Decreased stride  length;Shuffle;Trunk flexed   Gait velocity interpretation: <1.8 ft/sec, indicate of risk for recurrent falls General Gait Details: Forward flexed posture with decreased foot clearance; Min A for balance esp with turns. Very slow.  Stairs            Wheelchair Mobility    Modified Rankin (Stroke Patients Only)       Balance Overall balance assessment: Needs assistance;History of Falls Sitting-balance support: Feet supported;Bilateral upper extremity supported Sitting balance-Leahy Scale: Fair Sitting balance - Comments: Requires at least 1 UE support in sitting   Standing balance support: During functional activity;Bilateral upper extremity supported Standing balance-Leahy Scale: Poor Standing balance comment: Reliant on BUe and external support for standing balance.                              Pertinent Vitals/Pain Pain Assessment: No/denies pain    Home Living Family/patient expects to be discharged to:: Private residence Living Arrangements: Alone Available Help at Discharge: Personal care attendant;Available 24 hours/day(has 2 PCAs that assist as needed, pt says she is never alone) Type of Home: House Home Access: Ramped entrance     Home Layout: One level Home Equipment: [place] - 4 wheels;Walker - 2 wheels;Bedside commode;Shower seat      Prior Function Level of Independence: Needs assistance   Gait / Transfers Assistance Needed: uses rollator  ADL's / Homemaking Assistance Needed: assist with bathing/dressing. Prepares meals some times.   Comments: Has been working with HHPT PTA.     Hand Dominance        Extremity/Trunk Assessment   Upper Extremity Assessment Upper Extremity Assessment: Defer to OT evaluation    Lower  Extremity Assessment Lower Extremity Assessment: Generalized weakness    Cervical / Trunk Assessment Cervical / Trunk Assessment: Kyphotic  Communication   Communication: HOH  Cognition Arousal/Alertness:  Awake/alert Behavior During Therapy: WFL for tasks assessed/performed Overall Cognitive Status: Within Functional Limits for tasks assessed                                 General Comments: for basic mobility tasks.      General Comments General comments (skin integrity, edema, etc.): Friend present during session.     Exercises     Assessment/Plan    PT Assessment Patient needs continued PT services  PT Problem List Decreased strength;Decreased mobility;Decreased balance;Decreased activity tolerance       PT Treatment Interventions Functional mobility training;Balance training;Patient/family education;Gait training;Therapeutic activities;Therapeutic exercise    PT Goals (Current goals can be found in the Care Plan section)  Acute Rehab PT Goals Patient Stated Goal: to get home PT Goal Formulation: With patient Time For Goal Achievement: 07/20/18 Potential to Achieve Goals: Good    Frequency Min 3X/week   Barriers to discharge        Co-evaluation               AM-PAC PT "6 Clicks" Mobility  Outcome Measure Help needed turning from your back to your side while in a flat bed without using bedrails?: A Little Help needed moving from lying on your back to sitting on the side of a flat bed without using bedrails?: A Little Help needed moving to and from a bed to a chair (including a wheelchair)?: A Lot Help needed standing up from a chair using your arms (e.g., wheelchair or bedside chair)?: A Lot Help needed to walk in hospital room?: A Little Help needed climbing 3-5 steps with a railing? : A Lot 6 Click Score: 15    End of Session Equipment Utilized During Treatment: Gait belt Activity Tolerance: Patient tolerated treatment well;Patient limited by fatigue Patient left: in chair;with call bell/phone within reach;with family/visitor present Nurse Communication: Mobility status PT Visit Diagnosis: Muscle weakness (generalized) (M62.81);Unsteadiness  on feet (R26.81);Difficulty in walking, not elsewhere classified (R26.2)    Time: 6283-1517 PT Time Calculation (min) (ACUTE ONLY): 32 min   Charges:   PT Evaluation $PT Eval Moderate Complexity: 1 Mod PT Treatments $Gait Training: 8-22 mins        Wray Kearns, PT, DPT Acute Rehabilitation Services Pager [phone] Office [place] 07/06/2018, 12:03 PM

## 2018-07-06 NOTE — Care Management Note (Signed)
Case Management Note  Patient Details  Name: Heidi Dominguez MRN: [id number] Date of Birth: 1921/06/30  Subjective/Objective:                    Action/Plan:  Spoke w patient and daughter at bedside re [facility] referral. They are familiar w [place] and would like to use them. Referral placed to [facility]. They state that current rollator is likely 82 yrs old, a new one will not be covered.  No other CM needs identified.   Expected Discharge Date:  07/06/18               Expected Discharge Plan:     In-House Referral:  Clinical Social Work  Discharge planning Services  CM Consult  Post Acute Care Choice:  Home Health Choice offered to:  Patient, Adult Children  DME Arranged:    DME Agency:     HH Arranged:  RN, PT, OT, Nurse's Aide [place] Agency:  [place]  Status of Service:  Completed, signed off  If discussed at [place] of Stay Meetings, dates discussed:    Additional Comments:  Carles Collet, RN 07/06/2018, 11:56 AM

## 2018-07-06 NOTE — Progress Notes (Signed)
[name] to be D/C'd Home per MD order.  Discussed with the patient and all questions fully answered.  VSS, Skin clean, dry and intact without evidence of skin break down, no evidence of skin tears noted. IV catheter discontinued intact. Site without signs and symptoms of complications. Dressing and pressure applied.  An After Visit Summary was printed and given to the patient. Patient received prescription.  D/c education completed with patient/family including follow up instructions, medication list, d/c activities limitations if indicated, with other d/c instructions as indicated by MD - patient able to verbalize understanding, all questions fully answered.   Patient instructed to return to ED, call 911, or call MD for any changes in condition.   Patient escorted via [place], and D/C home via private auto.  Holley Raring 07/06/2018 1:40 PM

## 2018-07-06 NOTE — Discharge Instructions (Signed)
Follow with Primary MD Lajean Manes, MD in 7 days   Get CBC, CMP, 2 view Chest X ray -  checked  by Primary MD in 5-7 days    Activity: As tolerated with Full fall precautions use walker/cane & assistance as needed  Disposition Home    Diet: Heart Healthy    Special Instructions: If you have smoked or chewed Tobacco  in the last 2 yrs please stop smoking, stop any regular Alcohol  and or any Recreational drug use.  On your next visit with your primary care physician please Get Medicines reviewed and adjusted.  Please request your Prim.MD to go over all Hospital Tests and Procedure/Radiological results at the follow up, please get all Hospital records sent to your Prim MD by signing hospital release before you go home.  If you experience worsening of your admission symptoms, develop shortness of breath, life threatening emergency, suicidal or homicidal thoughts you must seek medical attention immediately by calling 911 or calling your MD immediately  if symptoms less severe.  You Must read complete instructions/literature along with all the possible adverse reactions/side effects for all the Medicines you take and that have been prescribed to you. Take any new Medicines after you have completely understood and accpet all the possible adverse reactions/side effects.

## 2018-07-06 NOTE — Discharge Summary (Signed)
Heidi Dominguez NGE:[id number] DOB: 03-06-1921 DOA: 07/04/2018  PCP: Lajean Manes, MD  Admit date: 07/04/2018  Discharge date: 07/06/2018  Admitted From: Home   Disposition:  Home   Recommendations for Outpatient Follow-up:   Follow up with PCP in 1-2 weeks  PCP Please obtain BMP/CBC, 2 view CXR in 1week,  (see Discharge instructions)   PCP Please follow up on the following pending results:    Home Health: PT, RN Equipment/Devices: Rolling walker  Consultations: None Discharge Condition: Fair   CODE STATUS: Full   Diet Recommendation: Heart Healthy     Chief Complaint  Patient presents with  . Weakness     Brief history of present illness from the day of admission and additional interim summary    84 y.o.femalewith medical history significant ofHTN; HLD; DM;macular degeneration;and chronic diastolic CHF presenting with weakness, work-up significant for elevated LFTs, nonspecific bile duct dilation ,She is s/'p cholecystectomy.  Work-up was significant for weighted LFTs, abnormal urine analysis, imaging initially suspicious for nonspecific biliary duct dilation.                                                                 Hospital Course   Abdominal pain/elevated LFTs  -most likely due to UTI and bacteremia, unremarkable MRCP, seen by GI, work-up unrevealing and LFTs trending down, abdominal pain-free, no right upper quadrant tenderness, will be discharged home with outpatient GI and PCP follow-up.   UTI/bacteremia  -Blood culture and urine culture showing E. coli, treated with Rocephin here, infection has clinically defervesced she will get 7 more days of oral Keflex.  HTN -Continue Norvasc  DM -hold Glucophage -Cover with moderate-scale SSI  Chronic diastolic CHF -4/01 echo with  preserved EF and grade 1 diastolic dysfunction -Appears to be compensated at this time, continue to hold Lasix, monitor closely as on gentle hydration  Stage 3 CKD -Appears to be stable at this time  Hypokalemia.  Replaced.  Discharge diagnosis     Principal Problem:   Elevated LFTs Active Problems:   Acute lower UTI   Essential hypertension   Dyslipidemia   Diabetes mellitus type 2, controlled (HCC)   Chronic diastolic CHF (congestive heart failure) (HCC)   CKD (chronic kidney disease) stage 3, GFR 30-59 ml/min [facility])    Discharge instructions    Discharge Instructions    Increase activity slowly   Complete by:  As directed       Discharge Medications   Allergies as of 07/06/2018      Reactions   Catapres [clonidine Hcl] Other (See Comments)   Unknown. Unknown.   Covera-hs [verapamil Hcl] Other (See Comments)   Unknown.   Other Other (See Comments)   Any type of narcotics Feels "loopy" Any type of narcotics Feels "loopy"   Sulfa Antibiotics Itching  Sulfamethoxazole Diarrhea   Verapamil Diarrhea, Other (See Comments)   Sulfasalazine Itching      Medication List    TAKE these medications   albuterol 108 (90 Base) MCG/ACT inhaler Commonly known as:  PROVENTIL HFA;VENTOLIN HFA Inhale into the lungs every 6 (six) hours as needed for wheezing or shortness of breath.   albuterol (2.5 MG/3ML) 0.083% nebulizer solution Commonly known as:  PROVENTIL Take 3 mLs (2.5 mg total) by nebulization every 6 (six) hours as needed for wheezing or shortness of breath.   amLODipine 10 MG tablet Commonly known as:  NORVASC Take 1 tablet (10 mg total) by mouth daily. What changed:  how much to take   aspirin 81 MG tablet Take 81 mg by mouth daily.   cephALEXin 500 MG capsule Commonly known as:  KEFLEX Take 1 capsule (500 mg total) by mouth 3 (three) times daily for 7 days.   famotidine 20 MG tablet Commonly known as:  PEPCID Take 20 mg by mouth at bedtime.     furosemide 40 MG tablet Commonly known as:  LASIX Take 40 mg by mouth daily.   Iron 240 (27 Fe) MG Tabs Take 1 tablet by mouth daily.   JANUVIA 50 MG tablet Generic drug:  sitaGLIPtin Take 50 mg by mouth daily.   loratadine 10 MG tablet Commonly known as:  CLARITIN Take 10 mg by mouth daily.   potassium chloride 10 MEQ tablet Commonly known as:  K-DUR,KLOR-CON Take 10 mEq by mouth daily.   SYMBICORT 160-4.5 MCG/ACT inhaler Generic drug:  budesonide-formoterol Inhale 2 puffs into the lungs daily.   vitamin B-12 500 MCG tablet Commonly known as:  CYANOCOBALAMIN Take 1,000 mcg by mouth daily.       Follow-up Information    Stoneking, Hal, MD. Schedule an appointment as soon as possible for a visit in 1 week(s).   Specialty:  Internal Medicine Contact information: [address] [place] [address] [place] [address] [phone]        [name], MD. Schedule an appointment as soon as possible for a visit in 1 week(s).   Specialty:  Gastroenterology Why:  elevated LFTs Contact information: [address] [place] [address] [phone]           Major procedures and Radiology Reports - PLEASE review detailed and final reports thoroughly  -        Dg Chest 2 View  Result Date: 07/04/2018 CLINICAL DATA:  generalized weakness that has been getting worse over the past four days with nausea and abdominal pain. Pt has a fall today where she slid out of bed, no injury EXAM: CHEST - 2 VIEW COMPARISON:  03/13/2018 FINDINGS: Mild cardiac enlargement. No vascular congestion. No edema or consolidation in the lungs. No blunting of costophrenic angles. No pneumothorax. Esophageal hiatal hernia behind the heart. Calcification of the aorta. Postoperative changes in both shoulders. Degenerative changes in the spine. Anterior compression deformities of lower thoracic and upper lumbar vertebrae without change. IMPRESSION: Cardiac enlargement. No evidence  of active pulmonary disease. Electronically Signed   By: Lucienne Capers M.D.   On: 07/04/2018 04:43   Ct Abdomen Pelvis W Contrast  Result Date: 07/04/2018 CLINICAL DATA:  84 year old female with abdominal pain, nausea vomiting. EXAM: CT ABDOMEN AND PELVIS WITH CONTRAST TECHNIQUE: Multidetector CT imaging of the abdomen and pelvis was performed using the standard protocol following bolus administration of intravenous contrast. CONTRAST:  60mL ISOVUE-300 IOPAMIDOL (ISOVUE-300) INJECTION 61% COMPARISON:  CT Abdomen  and Pelvis 08/07/2012. FINDINGS: Lower chest: Chronic cardiomegaly, calcified coronary artery atherosclerosis, moderate to large gastric hiatal hernia. Increased lung base atelectasis greater on the left. No pericardial or pleural effusion. Hepatobiliary: Gallbladder is surgically absent, new since 2013. Intra- and extrahepatic biliary ductal enlargement may be postoperative. Otherwise negative liver. Pancreas: Negative. Spleen: Negative. Adrenals/Urinary Tract: Stable mild adrenal gland thickening. Chronic right renal midpole benign cysts. Renal enhancement and contrast excretion is symmetric and within normal limits. No hydronephrosis or hydroureter. There is prominent gas within the urinary bladder (series 3, image 74). No perivesical stranding. Stomach/Bowel: Negative rectum. Decompressed distal sigmoid colon. Proximal sigmoid diverticulosis, with an indistinct appearance of the bowel wall in that segment but similar to that in 2013. No sigmoid mesenteric stranding. Occasional diverticula in the descending colon. Redundant transverse colon. The cecum is on a lax mesentery located in the right upper abdomen. There is no convincing large bowel inflammation. Negative negative terminal ileum. No dilated small bowel. No mesenteric stranding. Decompressed intra-abdominal stomach. Small volume of fluid in the gastric hernia. Negative duodenum. No free air, free fluid. Vascular/Lymphatic: Aortoiliac  calcified atherosclerosis. Suboptimal intravascular contrast bolus but the major arterial structures in the abdomen and pelvis appear patent. Portal venous system is patent. No lymphadenopathy. Reproductive: Surgically absent uterus. Diminutive or absent ovaries. Other: No pelvic free fluid. Musculoskeletal: Chronic severe T12 and L1 compression fractures. Chronic previously augmented L3 compression fracture. Severe chronic lower thoracic and lumbar spine degeneration with mild scoliosis. No acute osseous abnormality identified. IMPRESSION: 1. Gas within the urinary bladder suspicious for UTI unless explained by recent catheterization. 2. Cholecystectomy since the prior CT in 2013. Intra- and extrahepatic biliary ductal enlargement may be postoperative in nature, but if there is hyperbilirubinemia then consider distal CBD obstruction. 3. Sigmoid diverticulosis. Moderate to large gastric hiatal hernia. No convincing bowel inflammation. 4. Calcified coronary artery and Aortic Atherosclerosis (ICD10-I70.0). Cardiomegaly. 5. Chronic lower thoracic and lumbar compression fractures and advanced spinal degeneration. Electronically Signed   By: Genevie Ann M.D.   On: 07/04/2018 07:53   Mr 3d Recon At Scanner  Result Date: 07/04/2018 CLINICAL DATA:  Biliary dilatation. Mildly elevated bilirubin. Elevated liver enzymes. EXAM: MRI ABDOMEN WITHOUT AND WITH CONTRAST (INCLUDING MRCP) TECHNIQUE: Multiplanar multisequence MR imaging of the abdomen was performed both before and after the administration of intravenous contrast. Heavily T2-weighted images of the biliary and pancreatic ducts were obtained, and three-dimensional MRCP images were rendered by post processing. CONTRAST:  80 mL Gadavist COMPARISON:  CT 07/04/2018 FINDINGS: Lower chest:  Lung bases are clear. Hepatobiliary: Mild intrahepatic duct dilatation. The extrahepatic ducts are moderately dilated. The common hepatic duct and common bile duct are tortuous. The common  hepatic duct measures 14 mm and the common bile duct measures 11 mm (image 20/4). There is no filling defect within the common bile duct or common hepatic duct. No obstructing lesion identified at the ampullary region. The cystic duct remnant is also prominent. The pancreatic duct is prominent but within normal limits for age. No focal hepatic lesion. Pancreas: Mild fatty replaced through the pancreatic body and tail. Pancreatic duct is normal. No pancreatic mass. Spleen: Normal spleen. Adrenals/urinary tract: Adrenal glands and kidneys are normal. Stomach/Bowel: Stomach and limited of the small bowel is unremarkable Vascular/Lymphatic: Abdominal aortic normal caliber. No retroperitoneal periportal lymphadenopathy. Musculoskeletal: No aggressive osseous lesion IMPRESSION: 1. Dilated common bile duct and hepatic duct and mild intrahepatic duct dilatation. Favor chronic senescent biliary ductal dilatation following cholecystectomy. No obstructing intraluminal lesion. No extraluminal  lesion identified. 2. No significant pancreatic duct dilatation. Electronically Signed   By: Suzy Bouchard M.D.   On: 07/04/2018 21:38   Mr Abdomen Mrcp Moise Boring Contast  Result Date: 07/04/2018 CLINICAL DATA:  Biliary dilatation. Mildly elevated bilirubin. Elevated liver enzymes. EXAM: MRI ABDOMEN WITHOUT AND WITH CONTRAST (INCLUDING MRCP) TECHNIQUE: Multiplanar multisequence MR imaging of the abdomen was performed both before and after the administration of intravenous contrast. Heavily T2-weighted images of the biliary and pancreatic ducts were obtained, and three-dimensional MRCP images were rendered by post processing. CONTRAST:  80 mL Gadavist COMPARISON:  CT 07/04/2018 FINDINGS: Lower chest:  Lung bases are clear. Hepatobiliary: Mild intrahepatic duct dilatation. The extrahepatic ducts are moderately dilated. The common hepatic duct and common bile duct are tortuous. The common hepatic duct measures 14 mm and the common bile  duct measures 11 mm (image 20/4). There is no filling defect within the common bile duct or common hepatic duct. No obstructing lesion identified at the ampullary region. The cystic duct remnant is also prominent. The pancreatic duct is prominent but within normal limits for age. No focal hepatic lesion. Pancreas: Mild fatty replaced through the pancreatic body and tail. Pancreatic duct is normal. No pancreatic mass. Spleen: Normal spleen. Adrenals/urinary tract: Adrenal glands and kidneys are normal. Stomach/Bowel: Stomach and limited of the small bowel is unremarkable Vascular/Lymphatic: Abdominal aortic normal caliber. No retroperitoneal periportal lymphadenopathy. Musculoskeletal: No aggressive osseous lesion IMPRESSION: 1. Dilated common bile duct and hepatic duct and mild intrahepatic duct dilatation. Favor chronic senescent biliary ductal dilatation following cholecystectomy. No obstructing intraluminal lesion. No extraluminal lesion identified. 2. No significant pancreatic duct dilatation. Electronically Signed   By: Suzy Bouchard M.D.   On: 07/04/2018 21:38    Micro Results    Recent Results (from the past 240 hour(s))  Blood culture (routine x 2)     Status: Abnormal   Collection Time: 07/04/18  5:57 AM  Result Value Ref Range Status   Specimen Description BLOOD RIGHT ANTECUBITAL  Final   Special Requests   Final    BOTTLES DRAWN AEROBIC AND ANAEROBIC Blood Culture adequate volume   Culture  Setup Time   Final    ANAEROBIC BOTTLE ONLY GRAM NEGATIVE RODS CRITICAL RESULT CALLED TO, READ BACK BY AND VERIFIED WITH: A MEYER PHARMD 07/04/18 2143 JDW Performed at [facility], [address], [place], [place] [address]    Culture ESCHERICHIA COLI (A)  Final   Report Status 07/06/2018 FINAL  Final   Organism ID, Bacteria ESCHERICHIA COLI  Final      Susceptibility   Escherichia coli - MIC*    AMPICILLIN <=2 SENSITIVE Sensitive     CEFAZOLIN <=4 SENSITIVE Sensitive     CEFEPIME <=1  SENSITIVE Sensitive     CEFTAZIDIME <=1 SENSITIVE Sensitive     CEFTRIAXONE <=1 SENSITIVE Sensitive     CIPROFLOXACIN <=0.25 SENSITIVE Sensitive     GENTAMICIN <=1 SENSITIVE Sensitive     IMIPENEM <=0.25 SENSITIVE Sensitive     TRIMETH/SULFA <=20 SENSITIVE Sensitive     AMPICILLIN/SULBACTAM <=2 SENSITIVE Sensitive     PIP/TAZO <=4 SENSITIVE Sensitive     Extended ESBL NEGATIVE Sensitive     * ESCHERICHIA COLI  Blood culture (routine x 2)     Status: None (Preliminary result)   Collection Time: 07/04/18  5:57 AM  Result Value Ref Range Status   Specimen Description BLOOD LEFT ANTECUBITAL  Final   Special Requests   Final    BOTTLES  DRAWN AEROBIC AND ANAEROBIC Blood Culture adequate volume   Culture   Final    NO GROWTH 1 DAY Performed at [facility], [place] [address], [place], [place] [address]    Report Status PENDING  Incomplete  Blood Culture ID Panel (Reflexed)     Status: Abnormal   Collection Time: 07/04/18  5:57 AM  Result Value Ref Range Status   Enterococcus species NOT DETECTED NOT DETECTED Final   Vancomycin resistance NOT DETECTED NOT DETECTED Final   Listeria monocytogenes NOT DETECTED NOT DETECTED Final   Staphylococcus species NOT DETECTED NOT DETECTED Final   Staphylococcus aureus (BCID) NOT DETECTED NOT DETECTED Final   Methicillin resistance NOT DETECTED NOT DETECTED Final   Streptococcus species NOT DETECTED NOT DETECTED Final   Streptococcus agalactiae NOT DETECTED NOT DETECTED Final   Streptococcus pneumoniae NOT DETECTED NOT DETECTED Final   Streptococcus pyogenes NOT DETECTED NOT DETECTED Final   Acinetobacter baumannii NOT DETECTED NOT DETECTED Final   Enterobacteriaceae species DETECTED (A) NOT DETECTED Final    Comment: CRITICAL RESULT CALLED TO, READ BACK BY AND VERIFIED WITH: A MEYER PHARMD 07/04/18 2146 JDW Enterobacteriaceae represent a large family of gram-negative bacteria, not a single organism.    Enterobacter cloacae complex NOT DETECTED  NOT DETECTED Final   Escherichia coli DETECTED (A) NOT DETECTED Final    Comment: CRITICAL RESULT CALLED TO, READ BACK BY AND VERIFIED WITH: A MEYER PHARMD 07/04/18 2146 JDW    Klebsiella oxytoca NOT DETECTED NOT DETECTED Final   Klebsiella pneumoniae NOT DETECTED NOT DETECTED Final   Proteus species NOT DETECTED NOT DETECTED Final   Serratia marcescens NOT DETECTED NOT DETECTED Final   Carbapenem resistance NOT DETECTED NOT DETECTED Final   Haemophilus influenzae NOT DETECTED NOT DETECTED Final   Neisseria meningitidis NOT DETECTED NOT DETECTED Final   Pseudomonas aeruginosa NOT DETECTED NOT DETECTED Final   Candida albicans NOT DETECTED NOT DETECTED Final   Candida glabrata NOT DETECTED NOT DETECTED Final   Candida krusei NOT DETECTED NOT DETECTED Final   Candida parapsilosis NOT DETECTED NOT DETECTED Final   Candida tropicalis NOT DETECTED NOT DETECTED Final    Comment: Performed at [facility], [place] [address], [place], [place] [address]  Urine culture     Status: Abnormal   Collection Time: 07/04/18  7:53 AM  Result Value Ref Range Status   Specimen Description URINE, CLEAN CATCH  Final   Special Requests   Final    NONE Performed at [facility], [place] [address], [place], [place] [address]    Culture >=100,000 COLONIES/mL ESCHERICHIA COLI (A)  Final   Report Status 07/06/2018 FINAL  Final   Organism ID, Bacteria ESCHERICHIA COLI (A)  Final      Susceptibility   Escherichia coli - MIC*    AMPICILLIN >=32 RESISTANT Resistant     CEFAZOLIN <=4 SENSITIVE Sensitive     CEFTRIAXONE <=1 SENSITIVE Sensitive     CIPROFLOXACIN <=0.25 SENSITIVE Sensitive     GENTAMICIN <=1 SENSITIVE Sensitive     IMIPENEM <=0.25 SENSITIVE Sensitive     NITROFURANTOIN <=16 SENSITIVE Sensitive     TRIMETH/SULFA >=320 RESISTANT Resistant     AMPICILLIN/SULBACTAM 4 SENSITIVE Sensitive     PIP/TAZO <=4 SENSITIVE Sensitive     Extended ESBL NEGATIVE Sensitive     * >=100,000 COLONIES/mL  ESCHERICHIA COLI    Today   Subjective    Heidi Dominguez today has no headache,no chest abdominal pain,no new weakness  tingling or numbness, feels much better wants to go home today.    Objective   Blood pressure (!) 171/77, pulse 80, temperature 98.3 F (36.8 C), temperature source Oral, resp. rate 18, height 5\' 5"  (1.651 m), weight 79.4 kg, SpO2 97 %.   Intake/Output Summary (Last 24 hours) at 07/06/2018 1118 Last data filed at 07/06/2018 0951 Gross per 24 hour  Intake 607.59 ml  Output 1700 ml  Net -1092.41 ml    Exam Awake Alert, Oriented x 3, No new F.N deficits, Normal affect [place].AT,PERRAL Supple Neck,No JVD, No cervical lymphadenopathy appriciated.  Symmetrical Chest wall movement, Good air movement bilaterally, CTAB RRR,No Gallops,Rubs or new Murmurs, No Parasternal Heave +ve B.Sounds, Abd Soft, Non tender, No organomegaly appriciated, No rebound -guarding or rigidity. No Cyanosis, Clubbing or edema, No new Rash or bruise   Data Review   CBC w Diff:  Lab Results  Component Value Date   WBC 7.4 07/06/2018   HGB 13.5 07/06/2018   HCT 43.3 07/06/2018   PLT 86 (L) 07/06/2018   LYMPHOPCT 15.2 03/13/2018   MONOPCT 9.1 03/13/2018   EOSPCT 1.1 03/13/2018   BASOPCT 0.9 03/13/2018    CMP:  Lab Results  Component Value Date   NA 139 07/06/2018   K 2.9 (L) 07/06/2018   CL 103 07/06/2018   CO2 27 07/06/2018   BUN 8 07/06/2018   CREATININE 0.81 07/06/2018   PROT 5.7 (L) 07/06/2018   ALBUMIN 2.3 (L) 07/06/2018   BILITOT 1.5 (H) 07/06/2018   ALKPHOS 176 (H) 07/06/2018   AST 335 (H) 07/06/2018   ALT 658 (H) 07/06/2018  .   Total Time in preparing paper work, data evaluation and todays exam - 58 minutes  Lala Lund M.D on 07/06/2018 at 11:18 AM  [facility]   Office  [phone]

## 2018-07-06 NOTE — Progress Notes (Signed)
Printed prescription was faxed to [facility] on [address].

## 2018-07-08 LAB — ANTINUCLEAR ANTIBODIES, IFA: ANA Ab, IFA: NEGATIVE

## 2018-07-09 DIAGNOSIS — N390 Urinary tract infection, site not specified: Secondary | ICD-10-CM | POA: Diagnosis not present

## 2018-07-09 DIAGNOSIS — B9620 Unspecified Escherichia coli [E. coli] as the cause of diseases classified elsewhere: Secondary | ICD-10-CM | POA: Diagnosis not present

## 2018-07-09 DIAGNOSIS — Z96651 Presence of right artificial knee joint: Secondary | ICD-10-CM | POA: Diagnosis not present

## 2018-07-09 DIAGNOSIS — E1122 Type 2 diabetes mellitus with diabetic chronic kidney disease: Secondary | ICD-10-CM | POA: Diagnosis not present

## 2018-07-09 DIAGNOSIS — I5032 Chronic diastolic (congestive) heart failure: Secondary | ICD-10-CM | POA: Diagnosis not present

## 2018-07-09 DIAGNOSIS — N183 Chronic kidney disease, stage 3 (moderate): Secondary | ICD-10-CM | POA: Diagnosis not present

## 2018-07-09 DIAGNOSIS — I130 Hypertensive heart and chronic kidney disease with heart failure and stage 1 through stage 4 chronic kidney disease, or unspecified chronic kidney disease: Secondary | ICD-10-CM | POA: Diagnosis not present

## 2018-07-09 DIAGNOSIS — Z96652 Presence of left artificial knee joint: Secondary | ICD-10-CM | POA: Diagnosis not present

## 2018-07-09 DIAGNOSIS — Z7982 Long term (current) use of aspirin: Secondary | ICD-10-CM | POA: Diagnosis not present

## 2018-07-09 DIAGNOSIS — E785 Hyperlipidemia, unspecified: Secondary | ICD-10-CM | POA: Diagnosis not present

## 2018-07-09 LAB — CMV DNA, QUANTITATIVE, PCR
CMV DNA Quant: NEGATIVE IU/mL
Log10 CMV Qn DNA Pl: UNDETERMINED log10 IU/mL

## 2018-07-09 LAB — CULTURE, BLOOD (ROUTINE X 2)
Culture: NO GROWTH
Special Requests: ADEQUATE

## 2018-07-09 LAB — EPSTEIN BARR VRS(EBV DNA BY PCR)
EBV DNA QN by PCR: NEGATIVE copies/mL
LOG10 EBV DNA QN PCR: UNDETERMINED {Log_copies}/mL

## 2018-07-09 NOTE — Consult Note (Signed)
[facility] CM Primary Care Navigator  07/09/2018  Heidi Dominguez 12-Nov-1920 [id number]   Attempt to seepatient at the bedside to identify possible discharge needs but she wasalready dischargedhomeover the weekend with home health services.  Per MD note,patientpresentedwith weakness, and work-up was significant for elevated LFTs. (acute lower UTI/ bacteremia)  Patient has discharge instruction to follow-up with primary care provider in 1- 2 weeks and gastroenterology follow-up in 1 week.  Primary care provider's office is listed as providing transition of care (TOC) follow-up.   For additional questions please contact:  Karin Golden A. Dijuan Sleeth, BSN, RN-BC [facility] PRIMARY CARE Navigator Cell: [phone]

## 2018-07-10 ENCOUNTER — Telehealth: Payer: Self-pay | Admitting: Internal Medicine

## 2018-07-10 DIAGNOSIS — I5032 Chronic diastolic (congestive) heart failure: Secondary | ICD-10-CM | POA: Diagnosis not present

## 2018-07-10 DIAGNOSIS — I130 Hypertensive heart and chronic kidney disease with heart failure and stage 1 through stage 4 chronic kidney disease, or unspecified chronic kidney disease: Secondary | ICD-10-CM | POA: Diagnosis not present

## 2018-07-10 DIAGNOSIS — E1122 Type 2 diabetes mellitus with diabetic chronic kidney disease: Secondary | ICD-10-CM | POA: Diagnosis not present

## 2018-07-10 DIAGNOSIS — B9620 Unspecified Escherichia coli [E. coli] as the cause of diseases classified elsewhere: Secondary | ICD-10-CM | POA: Diagnosis not present

## 2018-07-10 DIAGNOSIS — N390 Urinary tract infection, site not specified: Secondary | ICD-10-CM | POA: Diagnosis not present

## 2018-07-10 DIAGNOSIS — N183 Chronic kidney disease, stage 3 (moderate): Secondary | ICD-10-CM | POA: Diagnosis not present

## 2018-07-10 MED ORDER — PREDNISONE 10 MG PO TABS: ORAL_TABLET | ORAL | 0 refills | Status: DC

## 2018-07-10 NOTE — Telephone Encounter (Signed)
If cannot reach patient's daughter at above listed number, try [phone].

## 2018-07-10 NOTE — Telephone Encounter (Signed)
Pt's daughter, [name] [facility]) is aware of below message and voiced her understanding. Rx for prednisone has been sent to preferred pharmacy.  [name] stated she would call back on Friday to schedule OV. Nothing further is needed.

## 2018-07-10 NOTE — Addendum Note (Signed)
Addended by: Maryanna Shape A on: 07/10/2018 12:23 PM   Modules accepted: Orders

## 2018-07-10 NOTE — Telephone Encounter (Signed)
Returned call to Patient's Daughter.  She stated that yesterday [facility] nurse, Maudie Mercury, came out and said that when she listened to the Patient's lung sounds, she heard wheezes.  She said that her Mother has a non productive cough, but stated that she feels like she needs to cough something up.  She said Maudie Mercury was going to call Dr. Melvyn Novas yesterday, but didn't. She is requesting something more to hep with cough and wheezing. Preferred pharmacy is [facility] on [address]   Will route to Dr. Melvyn Novas

## 2018-07-10 NOTE — Telephone Encounter (Signed)
Can try Prednisone 10 mg take  4 each am x 2 days,   2 each am x 2 days,  1 each am x 2 days and stop but then ov with all meds w/in 2 weeks - sooner if not helping

## 2018-07-10 NOTE — Telephone Encounter (Signed)
Will route to Dr. Melvyn Novas

## 2018-07-15 DIAGNOSIS — E11319 Type 2 diabetes mellitus with unspecified diabetic retinopathy without macular edema: Secondary | ICD-10-CM | POA: Diagnosis not present

## 2018-07-15 DIAGNOSIS — Z79899 Other long term (current) drug therapy: Secondary | ICD-10-CM | POA: Diagnosis not present

## 2018-07-15 DIAGNOSIS — I10 Essential (primary) hypertension: Secondary | ICD-10-CM | POA: Diagnosis not present

## 2018-07-17 DIAGNOSIS — I5032 Chronic diastolic (congestive) heart failure: Secondary | ICD-10-CM | POA: Diagnosis not present

## 2018-07-17 DIAGNOSIS — E1122 Type 2 diabetes mellitus with diabetic chronic kidney disease: Secondary | ICD-10-CM | POA: Diagnosis not present

## 2018-07-17 DIAGNOSIS — N183 Chronic kidney disease, stage 3 (moderate): Secondary | ICD-10-CM | POA: Diagnosis not present

## 2018-07-17 DIAGNOSIS — B9620 Unspecified Escherichia coli [E. coli] as the cause of diseases classified elsewhere: Secondary | ICD-10-CM | POA: Diagnosis not present

## 2018-07-17 DIAGNOSIS — N390 Urinary tract infection, site not specified: Secondary | ICD-10-CM | POA: Diagnosis not present

## 2018-07-17 DIAGNOSIS — I130 Hypertensive heart and chronic kidney disease with heart failure and stage 1 through stage 4 chronic kidney disease, or unspecified chronic kidney disease: Secondary | ICD-10-CM | POA: Diagnosis not present

## 2018-07-19 DIAGNOSIS — N390 Urinary tract infection, site not specified: Secondary | ICD-10-CM | POA: Diagnosis not present

## 2018-07-19 DIAGNOSIS — E1122 Type 2 diabetes mellitus with diabetic chronic kidney disease: Secondary | ICD-10-CM | POA: Diagnosis not present

## 2018-07-19 DIAGNOSIS — N183 Chronic kidney disease, stage 3 (moderate): Secondary | ICD-10-CM | POA: Diagnosis not present

## 2018-07-19 DIAGNOSIS — B9620 Unspecified Escherichia coli [E. coli] as the cause of diseases classified elsewhere: Secondary | ICD-10-CM | POA: Diagnosis not present

## 2018-07-19 DIAGNOSIS — I130 Hypertensive heart and chronic kidney disease with heart failure and stage 1 through stage 4 chronic kidney disease, or unspecified chronic kidney disease: Secondary | ICD-10-CM | POA: Diagnosis not present

## 2018-07-19 DIAGNOSIS — I5032 Chronic diastolic (congestive) heart failure: Secondary | ICD-10-CM | POA: Diagnosis not present

## 2018-07-24 DIAGNOSIS — I130 Hypertensive heart and chronic kidney disease with heart failure and stage 1 through stage 4 chronic kidney disease, or unspecified chronic kidney disease: Secondary | ICD-10-CM | POA: Diagnosis not present

## 2018-07-24 DIAGNOSIS — E1122 Type 2 diabetes mellitus with diabetic chronic kidney disease: Secondary | ICD-10-CM | POA: Diagnosis not present

## 2018-07-24 DIAGNOSIS — R351 Nocturia: Secondary | ICD-10-CM | POA: Diagnosis not present

## 2018-07-24 DIAGNOSIS — B9620 Unspecified Escherichia coli [E. coli] as the cause of diseases classified elsewhere: Secondary | ICD-10-CM | POA: Diagnosis not present

## 2018-07-24 DIAGNOSIS — R300 Dysuria: Secondary | ICD-10-CM | POA: Diagnosis not present

## 2018-07-24 DIAGNOSIS — N390 Urinary tract infection, site not specified: Secondary | ICD-10-CM | POA: Diagnosis not present

## 2018-07-24 DIAGNOSIS — N3000 Acute cystitis without hematuria: Secondary | ICD-10-CM | POA: Diagnosis not present

## 2018-07-24 DIAGNOSIS — N3281 Overactive bladder: Secondary | ICD-10-CM | POA: Diagnosis not present

## 2018-07-24 DIAGNOSIS — I5032 Chronic diastolic (congestive) heart failure: Secondary | ICD-10-CM | POA: Diagnosis not present

## 2018-07-24 DIAGNOSIS — N183 Chronic kidney disease, stage 3 (moderate): Secondary | ICD-10-CM | POA: Diagnosis not present

## 2018-07-26 ENCOUNTER — Ambulatory Visit (INDEPENDENT_AMBULATORY_CARE_PROVIDER_SITE_OTHER): Payer: Medicare Other | Admitting: Podiatry

## 2018-07-26 DIAGNOSIS — E1151 Type 2 diabetes mellitus with diabetic peripheral angiopathy without gangrene: Secondary | ICD-10-CM | POA: Diagnosis not present

## 2018-07-26 DIAGNOSIS — L84 Corns and callosities: Secondary | ICD-10-CM

## 2018-07-26 DIAGNOSIS — E1122 Type 2 diabetes mellitus with diabetic chronic kidney disease: Secondary | ICD-10-CM | POA: Diagnosis not present

## 2018-07-26 DIAGNOSIS — I5032 Chronic diastolic (congestive) heart failure: Secondary | ICD-10-CM | POA: Diagnosis not present

## 2018-07-26 DIAGNOSIS — M79675 Pain in left toe(s): Secondary | ICD-10-CM | POA: Diagnosis not present

## 2018-07-26 DIAGNOSIS — M79674 Pain in right toe(s): Secondary | ICD-10-CM

## 2018-07-26 DIAGNOSIS — E1142 Type 2 diabetes mellitus with diabetic polyneuropathy: Secondary | ICD-10-CM

## 2018-07-26 DIAGNOSIS — B351 Tinea unguium: Secondary | ICD-10-CM | POA: Diagnosis not present

## 2018-07-26 DIAGNOSIS — N183 Chronic kidney disease, stage 3 (moderate): Secondary | ICD-10-CM | POA: Diagnosis not present

## 2018-07-26 DIAGNOSIS — I130 Hypertensive heart and chronic kidney disease with heart failure and stage 1 through stage 4 chronic kidney disease, or unspecified chronic kidney disease: Secondary | ICD-10-CM | POA: Diagnosis not present

## 2018-07-26 DIAGNOSIS — N390 Urinary tract infection, site not specified: Secondary | ICD-10-CM | POA: Diagnosis not present

## 2018-07-26 DIAGNOSIS — B9620 Unspecified Escherichia coli [E. coli] as the cause of diseases classified elsewhere: Secondary | ICD-10-CM | POA: Diagnosis not present

## 2018-07-26 NOTE — Patient Instructions (Addendum)
Onychomycosis/Fungal Toenails  WHAT IS IT? An infection that lies within the keratin of your nail plate that is caused by a fungus.  WHY ME? Fungal infections affect all ages, sexes, races, and creeds.  There may be many factors that predispose you to a fungal infection such as age, coexisting medical conditions such as diabetes, or an autoimmune disease; stress, medications, fatigue, genetics, etc.  Bottom line: fungus thrives in a warm, moist environment and your shoes offer such a location.  IS IT CONTAGIOUS? Theoretically, yes.  You do not want to share shoes, nail clippers or files with someone who has fungal toenails.  Walking around barefoot in the same room or sleeping in the same bed is unlikely to transfer the organism.  It is important to realize, however, that fungus can spread easily from one nail to the next on the same foot.  HOW DO WE TREAT THIS?  There are several ways to treat this condition.  Treatment may depend on many factors such as age, medications, pregnancy, liver and kidney conditions, etc.  It is best to ask your doctor which options are available to you.  1. No treatment.   Unlike many other medical concerns, you can live with this condition.  However for many people this can be a painful condition and may lead to ingrown toenails or a bacterial infection.  It is recommended that you keep the nails cut short to help reduce the amount of fungal nail. 2. Topical treatment.  These range from herbal remedies to prescription strength nail lacquers.  About 40-50% effective, topicals require twice daily application for approximately 9 to 12 months or until an entirely new nail has grown out.  The most effective topicals are medical grade medications available through physicians offices. 3. Oral antifungal medications.  With an 80-90% cure rate, the most common oral medication requires 3 to 4 months of therapy and stays in your system for a year as the new nail grows out.  Oral  antifungal medications do require blood work to make sure it is a safe drug for you.  A liver function panel will be performed prior to starting the medication and after the first month of treatment.  It is important to have the blood work performed to avoid any harmful side effects.  In general, this medication safe but blood work is required. 4. Laser Therapy.  This treatment is performed by applying a specialized laser to the affected nail plate.  This therapy is noninvasive, fast, and non-painful.  It is not covered by insurance and is therefore, out of pocket.  The results have been very good with a 80-95% cure rate.  The [facility] is the only practice in the area to offer this therapy. 5. Permanent Nail Avulsion.  Removing the entire nail so that a new nail will not grow back.  Diabetes and Foot Care Diabetes may cause you to have problems because of poor blood supply (circulation) to your feet and legs. This may cause the skin on your feet to become thinner, break easier, and heal more slowly. Your skin may become dry, and the skin may peel and crack. You may also have nerve damage in your legs and feet causing decreased feeling in them. You may not notice minor injuries to your feet that could lead to infections or more serious problems. Taking care of your feet is one of the most important things you can do for yourself. Follow these instructions at home:  Wear   shoes at all times, even in the house. Do not go barefoot. Bare feet are easily injured.  Check your feet daily for blisters, cuts, and redness. If you cannot see the bottom of your feet, use a mirror or ask someone for help.  Wash your feet with warm water (do not use hot water) and mild soap. Then pat your feet and the areas between your toes until they are completely dry. Do not soak your feet as this can dry your skin.  Apply a moisturizing lotion or petroleum jelly (that does not contain alcohol and is unscented) to the  skin on your feet and to dry, brittle toenails. Do not apply lotion between your toes.  Trim your toenails straight across. Do not dig under them or around the cuticle. File the edges of your nails with an emery board or nail file.  Do not cut corns or calluses or try to remove them with medicine.  Wear clean socks or stockings every day. Make sure they are not too tight. Do not wear knee-high stockings since they may decrease blood flow to your legs.  Wear shoes that fit properly and have enough cushioning. To break in new shoes, wear them for just a few hours a day. This prevents you from injuring your feet. Always look in your shoes before you put them on to be sure there are no objects inside.  Do not cross your legs. This may decrease the blood flow to your feet.  If you find a minor scrape, cut, or break in the skin on your feet, keep it and the skin around it clean and dry. These areas may be cleansed with mild soap and water. Do not cleanse the area with peroxide, alcohol, or iodine.  When you remove an adhesive bandage, be sure not to damage the skin around it.  If you have a wound, look at it several times a day to make sure it is healing.  Do not use heating pads or hot water bottles. They may burn your skin. If you have lost feeling in your feet or legs, you may not know it is happening until it is too late.  Make sure your health care provider performs a complete foot exam at least annually or more often if you have foot problems. Report any cuts, sores, or bruises to your health care provider immediately. Contact a health care provider if:  You have an injury that is not healing.  You have cuts or breaks in the skin.  You have an ingrown nail.  You notice redness on your legs or feet.  You feel burning or tingling in your legs or feet.  You have pain or cramps in your legs and feet.  Your legs or feet are numb.  Your feet always feel cold. Get help right away  if:  There is increasing redness, swelling, or pain in or around a wound.  There is a red line that goes up your leg.  Pus is coming from a wound.  You develop a fever or as directed by your health care provider.  You notice a bad smell coming from an ulcer or wound. This information is not intended to replace advice given to you by your health care provider. Make sure you discuss any questions you have with your health care provider. Document Released: 07/28/2000 Document Revised: 01/06/2016 Document Reviewed: 01/07/2013 Elsevier Interactive Patient Education  2017 [employer].  Diabetes and Foot Care Diabetes may cause you to  have problems because of poor blood supply (circulation) to your feet and legs. This may cause the skin on your feet to become thinner, break easier, and heal more slowly. Your skin may become dry, and the skin may peel and crack. You may also have nerve damage in your legs and feet causing decreased feeling in them. You may not notice minor injuries to your feet that could lead to infections or more serious problems. Taking care of your feet is one of the most important things you can do for yourself. Follow these instructions at home: Wear shoes at all times, even in the house. Do not go barefoot. Bare feet are easily injured. Check your feet daily for blisters, cuts, and redness. If you cannot see the bottom of your feet, use a mirror or ask someone for help. Wash your feet with warm water (do not use hot water) and mild soap. Then pat your feet and the areas between your toes until they are completely dry. Do not soak your feet as this can dry your skin. Apply a moisturizing lotion or petroleum jelly (that does not contain alcohol and is unscented) to the skin on your feet and to dry, brittle toenails. Do not apply lotion between your toes. Trim your toenails straight across. Do not dig under them or around the cuticle. File the edges of your nails with an emery  board or nail file. Do not cut corns or calluses or try to remove them with medicine. Wear clean socks or stockings every day. Make sure they are not too tight. Do not wear knee-high stockings since they may decrease blood flow to your legs. Wear shoes that fit properly and have enough cushioning. To break in new shoes, wear them for just a few hours a day. This prevents you from injuring your feet. Always look in your shoes before you put them on to be sure there are no objects inside. Do not cross your legs. This may decrease the blood flow to your feet. If you find a minor scrape, cut, or break in the skin on your feet, keep it and the skin around it clean and dry. These areas may be cleansed with mild soap and water. Do not cleanse the area with peroxide, alcohol, or iodine. When you remove an adhesive bandage, be sure not to damage the skin around it. If you have a wound, look at it several times a day to make sure it is healing. Do not use heating pads or hot water bottles. They may burn your skin. If you have lost feeling in your feet or legs, you may not know it is happening until it is too late. Make sure your health care provider performs a complete foot exam at least annually or more often if you have foot problems. Report any cuts, sores, or bruises to your health care provider immediately. Contact a health care provider if: You have an injury that is not healing. You have cuts or breaks in the skin. You have an ingrown nail. You notice redness on your legs or feet. You feel burning or tingling in your legs or feet. You have pain or cramps in your legs and feet. Your legs or feet are numb. Your feet always feel cold. Get help right away if: There is increasing redness, swelling, or pain in or around a wound. There is a red line that goes up your leg. Pus is coming from a wound. You develop a fever or as directed  by your health care provider. You notice a bad smell coming from an  ulcer or wound. This information is not intended to replace advice given to you by your health care provider. Make sure you discuss any questions you have with your health care provider. Document Released: 07/28/2000 Document Revised: 01/06/2016 Document Reviewed: 01/07/2013 Elsevier Interactive Patient Education  2017 [employer].

## 2018-07-29 DIAGNOSIS — B9620 Unspecified Escherichia coli [E. coli] as the cause of diseases classified elsewhere: Secondary | ICD-10-CM | POA: Diagnosis not present

## 2018-07-29 DIAGNOSIS — I130 Hypertensive heart and chronic kidney disease with heart failure and stage 1 through stage 4 chronic kidney disease, or unspecified chronic kidney disease: Secondary | ICD-10-CM | POA: Diagnosis not present

## 2018-07-29 DIAGNOSIS — E1122 Type 2 diabetes mellitus with diabetic chronic kidney disease: Secondary | ICD-10-CM | POA: Diagnosis not present

## 2018-07-29 DIAGNOSIS — I5032 Chronic diastolic (congestive) heart failure: Secondary | ICD-10-CM | POA: Diagnosis not present

## 2018-07-29 DIAGNOSIS — N183 Chronic kidney disease, stage 3 (moderate): Secondary | ICD-10-CM | POA: Diagnosis not present

## 2018-07-29 DIAGNOSIS — N390 Urinary tract infection, site not specified: Secondary | ICD-10-CM | POA: Diagnosis not present

## 2018-08-01 DIAGNOSIS — I130 Hypertensive heart and chronic kidney disease with heart failure and stage 1 through stage 4 chronic kidney disease, or unspecified chronic kidney disease: Secondary | ICD-10-CM | POA: Diagnosis not present

## 2018-08-01 DIAGNOSIS — N183 Chronic kidney disease, stage 3 (moderate): Secondary | ICD-10-CM | POA: Diagnosis not present

## 2018-08-01 DIAGNOSIS — I5032 Chronic diastolic (congestive) heart failure: Secondary | ICD-10-CM | POA: Diagnosis not present

## 2018-08-01 DIAGNOSIS — E1122 Type 2 diabetes mellitus with diabetic chronic kidney disease: Secondary | ICD-10-CM | POA: Diagnosis not present

## 2018-08-01 DIAGNOSIS — N390 Urinary tract infection, site not specified: Secondary | ICD-10-CM | POA: Diagnosis not present

## 2018-08-01 DIAGNOSIS — B9620 Unspecified Escherichia coli [E. coli] as the cause of diseases classified elsewhere: Secondary | ICD-10-CM | POA: Diagnosis not present

## 2018-08-04 DIAGNOSIS — B9620 Unspecified Escherichia coli [E. coli] as the cause of diseases classified elsewhere: Secondary | ICD-10-CM | POA: Diagnosis not present

## 2018-08-04 DIAGNOSIS — I130 Hypertensive heart and chronic kidney disease with heart failure and stage 1 through stage 4 chronic kidney disease, or unspecified chronic kidney disease: Secondary | ICD-10-CM | POA: Diagnosis not present

## 2018-08-04 DIAGNOSIS — N183 Chronic kidney disease, stage 3 (moderate): Secondary | ICD-10-CM | POA: Diagnosis not present

## 2018-08-04 DIAGNOSIS — N390 Urinary tract infection, site not specified: Secondary | ICD-10-CM | POA: Diagnosis not present

## 2018-08-04 DIAGNOSIS — I5032 Chronic diastolic (congestive) heart failure: Secondary | ICD-10-CM | POA: Diagnosis not present

## 2018-08-04 DIAGNOSIS — E1122 Type 2 diabetes mellitus with diabetic chronic kidney disease: Secondary | ICD-10-CM | POA: Diagnosis not present

## 2018-08-05 DIAGNOSIS — I10 Essential (primary) hypertension: Secondary | ICD-10-CM | POA: Diagnosis not present

## 2018-08-05 DIAGNOSIS — I5032 Chronic diastolic (congestive) heart failure: Secondary | ICD-10-CM | POA: Diagnosis not present

## 2018-08-05 DIAGNOSIS — R509 Fever, unspecified: Secondary | ICD-10-CM | POA: Diagnosis not present

## 2018-08-05 DIAGNOSIS — K5901 Slow transit constipation: Secondary | ICD-10-CM | POA: Diagnosis not present

## 2018-08-05 DIAGNOSIS — E11319 Type 2 diabetes mellitus with unspecified diabetic retinopathy without macular edema: Secondary | ICD-10-CM | POA: Diagnosis not present

## 2018-08-12 DIAGNOSIS — N390 Urinary tract infection, site not specified: Secondary | ICD-10-CM | POA: Diagnosis not present

## 2018-08-12 DIAGNOSIS — N183 Chronic kidney disease, stage 3 (moderate): Secondary | ICD-10-CM | POA: Diagnosis not present

## 2018-08-12 DIAGNOSIS — E1122 Type 2 diabetes mellitus with diabetic chronic kidney disease: Secondary | ICD-10-CM | POA: Diagnosis not present

## 2018-08-12 DIAGNOSIS — I5032 Chronic diastolic (congestive) heart failure: Secondary | ICD-10-CM | POA: Diagnosis not present

## 2018-08-12 DIAGNOSIS — I130 Hypertensive heart and chronic kidney disease with heart failure and stage 1 through stage 4 chronic kidney disease, or unspecified chronic kidney disease: Secondary | ICD-10-CM | POA: Diagnosis not present

## 2018-08-12 DIAGNOSIS — B9620 Unspecified Escherichia coli [E. coli] as the cause of diseases classified elsewhere: Secondary | ICD-10-CM | POA: Diagnosis not present

## 2018-08-13 DIAGNOSIS — N3281 Overactive bladder: Secondary | ICD-10-CM | POA: Diagnosis not present

## 2018-08-13 DIAGNOSIS — R300 Dysuria: Secondary | ICD-10-CM | POA: Diagnosis not present

## 2018-08-15 DIAGNOSIS — E1122 Type 2 diabetes mellitus with diabetic chronic kidney disease: Secondary | ICD-10-CM | POA: Diagnosis not present

## 2018-08-15 DIAGNOSIS — N183 Chronic kidney disease, stage 3 (moderate): Secondary | ICD-10-CM | POA: Diagnosis not present

## 2018-08-15 DIAGNOSIS — I130 Hypertensive heart and chronic kidney disease with heart failure and stage 1 through stage 4 chronic kidney disease, or unspecified chronic kidney disease: Secondary | ICD-10-CM | POA: Diagnosis not present

## 2018-08-15 DIAGNOSIS — B9620 Unspecified Escherichia coli [E. coli] as the cause of diseases classified elsewhere: Secondary | ICD-10-CM | POA: Diagnosis not present

## 2018-08-15 DIAGNOSIS — I5032 Chronic diastolic (congestive) heart failure: Secondary | ICD-10-CM | POA: Diagnosis not present

## 2018-08-15 DIAGNOSIS — N390 Urinary tract infection, site not specified: Secondary | ICD-10-CM | POA: Diagnosis not present

## 2018-08-20 DIAGNOSIS — N390 Urinary tract infection, site not specified: Secondary | ICD-10-CM | POA: Diagnosis not present

## 2018-08-20 DIAGNOSIS — N183 Chronic kidney disease, stage 3 (moderate): Secondary | ICD-10-CM | POA: Diagnosis not present

## 2018-08-20 DIAGNOSIS — I5032 Chronic diastolic (congestive) heart failure: Secondary | ICD-10-CM | POA: Diagnosis not present

## 2018-08-20 DIAGNOSIS — I130 Hypertensive heart and chronic kidney disease with heart failure and stage 1 through stage 4 chronic kidney disease, or unspecified chronic kidney disease: Secondary | ICD-10-CM | POA: Diagnosis not present

## 2018-08-20 DIAGNOSIS — B9620 Unspecified Escherichia coli [E. coli] as the cause of diseases classified elsewhere: Secondary | ICD-10-CM | POA: Diagnosis not present

## 2018-08-20 DIAGNOSIS — E1122 Type 2 diabetes mellitus with diabetic chronic kidney disease: Secondary | ICD-10-CM | POA: Diagnosis not present

## 2018-08-21 DIAGNOSIS — N183 Chronic kidney disease, stage 3 (moderate): Secondary | ICD-10-CM | POA: Diagnosis not present

## 2018-08-21 DIAGNOSIS — I130 Hypertensive heart and chronic kidney disease with heart failure and stage 1 through stage 4 chronic kidney disease, or unspecified chronic kidney disease: Secondary | ICD-10-CM | POA: Diagnosis not present

## 2018-08-21 DIAGNOSIS — B9620 Unspecified Escherichia coli [E. coli] as the cause of diseases classified elsewhere: Secondary | ICD-10-CM | POA: Diagnosis not present

## 2018-08-21 DIAGNOSIS — E1122 Type 2 diabetes mellitus with diabetic chronic kidney disease: Secondary | ICD-10-CM | POA: Diagnosis not present

## 2018-08-21 DIAGNOSIS — N390 Urinary tract infection, site not specified: Secondary | ICD-10-CM | POA: Diagnosis not present

## 2018-08-21 DIAGNOSIS — I5032 Chronic diastolic (congestive) heart failure: Secondary | ICD-10-CM | POA: Diagnosis not present

## 2018-08-22 DIAGNOSIS — E1122 Type 2 diabetes mellitus with diabetic chronic kidney disease: Secondary | ICD-10-CM | POA: Diagnosis not present

## 2018-08-22 DIAGNOSIS — N390 Urinary tract infection, site not specified: Secondary | ICD-10-CM | POA: Diagnosis not present

## 2018-08-22 DIAGNOSIS — N183 Chronic kidney disease, stage 3 (moderate): Secondary | ICD-10-CM | POA: Diagnosis not present

## 2018-08-22 DIAGNOSIS — B9620 Unspecified Escherichia coli [E. coli] as the cause of diseases classified elsewhere: Secondary | ICD-10-CM | POA: Diagnosis not present

## 2018-08-22 DIAGNOSIS — I5032 Chronic diastolic (congestive) heart failure: Secondary | ICD-10-CM | POA: Diagnosis not present

## 2018-08-22 DIAGNOSIS — I130 Hypertensive heart and chronic kidney disease with heart failure and stage 1 through stage 4 chronic kidney disease, or unspecified chronic kidney disease: Secondary | ICD-10-CM | POA: Diagnosis not present

## 2018-08-23 ENCOUNTER — Encounter (HOSPITAL_COMMUNITY): Payer: Self-pay | Admitting: Emergency Medicine

## 2018-08-23 ENCOUNTER — Emergency Department (HOSPITAL_COMMUNITY): Payer: Medicare Other

## 2018-08-23 ENCOUNTER — Observation Stay (HOSPITAL_COMMUNITY)
Admission: EM | Admit: 2018-08-23 | Discharge: 2018-08-25 | Disposition: A | Discharge status: Home / Self Care | Payer: Medicare Other | Attending: Internal Medicine | Admitting: Internal Medicine

## 2018-08-23 ENCOUNTER — Observation Stay (HOSPITAL_COMMUNITY): Payer: Medicare Other

## 2018-08-23 ENCOUNTER — Other Ambulatory Visit: Payer: Self-pay

## 2018-08-23 DIAGNOSIS — E1165 Type 2 diabetes mellitus with hyperglycemia: Secondary | ICD-10-CM

## 2018-08-23 DIAGNOSIS — I5032 Chronic diastolic (congestive) heart failure: Secondary | ICD-10-CM | POA: Diagnosis not present

## 2018-08-23 DIAGNOSIS — I130 Hypertensive heart and chronic kidney disease with heart failure and stage 1 through stage 4 chronic kidney disease, or unspecified chronic kidney disease: Secondary | ICD-10-CM | POA: Diagnosis not present

## 2018-08-23 DIAGNOSIS — K559 Vascular disorder of intestine, unspecified: Secondary | ICD-10-CM | POA: Diagnosis not present

## 2018-08-23 DIAGNOSIS — M19019 Primary osteoarthritis, unspecified shoulder: Secondary | ICD-10-CM | POA: Insufficient documentation

## 2018-08-23 DIAGNOSIS — R1013 Epigastric pain: Secondary | ICD-10-CM | POA: Diagnosis not present

## 2018-08-23 DIAGNOSIS — M549 Dorsalgia, unspecified: Secondary | ICD-10-CM | POA: Diagnosis not present

## 2018-08-23 DIAGNOSIS — K219 Gastro-esophageal reflux disease without esophagitis: Secondary | ICD-10-CM | POA: Insufficient documentation

## 2018-08-23 DIAGNOSIS — Z79899 Other long term (current) drug therapy: Secondary | ICD-10-CM | POA: Insufficient documentation

## 2018-08-23 DIAGNOSIS — R945 Abnormal results of liver function studies: Secondary | ICD-10-CM

## 2018-08-23 DIAGNOSIS — E876 Hypokalemia: Secondary | ICD-10-CM | POA: Insufficient documentation

## 2018-08-23 DIAGNOSIS — R740 Nonspecific elevation of levels of transaminase and lactic acid dehydrogenase [LDH]: Secondary | ICD-10-CM | POA: Diagnosis not present

## 2018-08-23 DIAGNOSIS — E119 Type 2 diabetes mellitus without complications: Secondary | ICD-10-CM

## 2018-08-23 DIAGNOSIS — I10 Essential (primary) hypertension: Secondary | ICD-10-CM | POA: Diagnosis present

## 2018-08-23 DIAGNOSIS — Z791 Long term (current) use of non-steroidal anti-inflammatories (NSAID): Secondary | ICD-10-CM | POA: Diagnosis not present

## 2018-08-23 DIAGNOSIS — R7989 Other specified abnormal findings of blood chemistry: Secondary | ICD-10-CM

## 2018-08-23 DIAGNOSIS — K851 Biliary acute pancreatitis without necrosis or infection: Principal | ICD-10-CM | POA: Insufficient documentation

## 2018-08-23 DIAGNOSIS — Z7982 Long term (current) use of aspirin: Secondary | ICD-10-CM | POA: Insufficient documentation

## 2018-08-23 DIAGNOSIS — G8929 Other chronic pain: Secondary | ICD-10-CM | POA: Insufficient documentation

## 2018-08-23 DIAGNOSIS — E1122 Type 2 diabetes mellitus with diabetic chronic kidney disease: Secondary | ICD-10-CM | POA: Diagnosis not present

## 2018-08-23 DIAGNOSIS — K5900 Constipation, unspecified: Secondary | ICD-10-CM | POA: Diagnosis not present

## 2018-08-23 DIAGNOSIS — N183 Chronic kidney disease, stage 3 unspecified: Secondary | ICD-10-CM | POA: Diagnosis present

## 2018-08-23 DIAGNOSIS — R109 Unspecified abdominal pain: Secondary | ICD-10-CM | POA: Diagnosis present

## 2018-08-23 DIAGNOSIS — E785 Hyperlipidemia, unspecified: Secondary | ICD-10-CM | POA: Insufficient documentation

## 2018-08-23 DIAGNOSIS — K5730 Diverticulosis of large intestine without perforation or abscess without bleeding: Secondary | ICD-10-CM | POA: Diagnosis not present

## 2018-08-23 LAB — URINALYSIS, ROUTINE W REFLEX MICROSCOPIC
Bilirubin Urine: NEGATIVE
Glucose, UA: NEGATIVE mg/dL
Hgb urine dipstick: NEGATIVE
KETONES UR: NEGATIVE mg/dL
Leukocytes, UA: NEGATIVE
Nitrite: NEGATIVE
Protein, ur: NEGATIVE mg/dL
Specific Gravity, Urine: 1.01 (ref 1.005–1.030)
pH: 6 (ref 5.0–8.0)

## 2018-08-23 LAB — CBC
HCT: 43.8 % (ref 36.0–46.0)
Hemoglobin: 14.1 g/dL (ref 12.0–15.0)
MCH: 30.6 pg (ref 26.0–34.0)
MCHC: 32.2 g/dL (ref 30.0–36.0)
MCV: 95 fL (ref 80.0–100.0)
Platelets: 160 10*3/uL (ref 150–400)
RBC: 4.61 MIL/uL (ref 3.87–5.11)
RDW: 13.9 % (ref 11.5–15.5)
WBC: 11.4 10*3/uL — ABNORMAL HIGH (ref 4.0–10.5)
nRBC: 0 % (ref 0.0–0.2)

## 2018-08-23 LAB — HEPATIC FUNCTION PANEL
ALBUMIN: 3.2 g/dL — AB (ref 3.5–5.0)
ALT: 260 U/L — ABNORMAL HIGH (ref 0–44)
AST: 297 U/L — ABNORMAL HIGH (ref 15–41)
Alkaline Phosphatase: 532 U/L — ABNORMAL HIGH (ref 38–126)
Bilirubin, Direct: 0.3 mg/dL — ABNORMAL HIGH (ref 0.0–0.2)
Indirect Bilirubin: 0.6 mg/dL (ref 0.3–0.9)
Total Bilirubin: 0.9 mg/dL (ref 0.3–1.2)
Total Protein: 7.3 g/dL (ref 6.5–8.1)

## 2018-08-23 LAB — COMPREHENSIVE METABOLIC PANEL
ALBUMIN: 3.1 g/dL — AB (ref 3.5–5.0)
ALT: 260 U/L — AB (ref 0–44)
AST: 460 U/L — AB (ref 15–41)
Alkaline Phosphatase: 527 U/L — ABNORMAL HIGH (ref 38–126)
Anion gap: 9 (ref 5–15)
BUN: 13 mg/dL (ref 8–23)
CHLORIDE: 97 mmol/L — AB (ref 98–111)
CO2: 27 mmol/L (ref 22–32)
CREATININE: 0.99 mg/dL (ref 0.44–1.00)
Calcium: 9.7 mg/dL (ref 8.9–10.3)
GFR calc non Af Amer: 48 mL/min — ABNORMAL LOW (ref >60)
GFR, EST AFRICAN AMERICAN: 56 mL/min — AB (ref >60)
GLUCOSE: 258 mg/dL — AB (ref 70–99)
Potassium: 3.5 mmol/L (ref 3.5–5.1)
SODIUM: 133 mmol/L — AB (ref 135–145)
TOTAL PROTEIN: 7.3 g/dL (ref 6.5–8.1)
Total Bilirubin: 1.1 mg/dL (ref 0.3–1.2)

## 2018-08-23 LAB — BASIC METABOLIC PANEL
Anion gap: 9 (ref 5–15)
BUN: 10 mg/dL (ref 8–23)
CO2: 29 mmol/L (ref 22–32)
Calcium: 9.9 mg/dL (ref 8.9–10.3)
Chloride: 100 mmol/L (ref 98–111)
Creatinine, Ser: 0.86 mg/dL (ref 0.44–1.00)
GFR calc Af Amer: >60 mL/min (ref >60)
GFR calc non Af Amer: 57 mL/min — ABNORMAL LOW (ref >60)
Glucose, Bld: 141 mg/dL — ABNORMAL HIGH (ref 70–99)
POTASSIUM: 3.8 mmol/L (ref 3.5–5.1)
Sodium: 138 mmol/L (ref 135–145)

## 2018-08-23 LAB — CBG MONITORING, ED: GLUCOSE-CAPILLARY: 115 mg/dL — AB (ref 70–99)

## 2018-08-23 LAB — TROPONIN I: Troponin I: <0.03 ng/mL (ref <0.03)

## 2018-08-23 LAB — ACETAMINOPHEN LEVEL: Acetaminophen (Tylenol), Serum: <10 ug/mL — ABNORMAL LOW (ref 10–30)

## 2018-08-23 LAB — CBC WITH DIFFERENTIAL/PLATELET
ABS IMMATURE GRANULOCYTES: 0.03 10*3/uL (ref 0.00–0.07)
Basophils Absolute: 0 10*3/uL (ref 0.0–0.1)
Basophils Relative: 0 %
Eosinophils Absolute: 0.1 10*3/uL (ref 0.0–0.5)
Eosinophils Relative: 1 %
HCT: 45.3 % (ref 36.0–46.0)
Hemoglobin: 14.1 g/dL (ref 12.0–15.0)
Immature Granulocytes: 0 %
LYMPHS ABS: 1.2 10*3/uL (ref 0.7–4.0)
Lymphocytes Relative: 14 %
MCH: 29.2 pg (ref 26.0–34.0)
MCHC: 31.1 g/dL (ref 30.0–36.0)
MCV: 93.8 fL (ref 80.0–100.0)
Monocytes Absolute: 0.7 10*3/uL (ref 0.1–1.0)
Monocytes Relative: 9 %
NEUTROS ABS: 6.2 10*3/uL (ref 1.7–7.7)
Neutrophils Relative %: 76 %
Platelets: 157 10*3/uL (ref 150–400)
RBC: 4.83 MIL/uL (ref 3.87–5.11)
RDW: 13.8 % (ref 11.5–15.5)
WBC: 8.2 10*3/uL (ref 4.0–10.5)
nRBC: 0 % (ref 0.0–0.2)

## 2018-08-23 LAB — LACTIC ACID, PLASMA: Lactic Acid, Venous: 1.4 mmol/L (ref 0.5–1.9)

## 2018-08-23 LAB — LIPASE, BLOOD: LIPASE: 3080 U/L — AB (ref 11–51)

## 2018-08-23 LAB — GLUCOSE, CAPILLARY
Glucose-Capillary: 108 mg/dL — ABNORMAL HIGH (ref 70–99)
Glucose-Capillary: 105 mg/dL — ABNORMAL HIGH (ref 70–99)
Glucose-Capillary: 106 mg/dL — ABNORMAL HIGH (ref 70–99)
Glucose-Capillary: 137 mg/dL — ABNORMAL HIGH (ref 70–99)
Glucose-Capillary: 114 mg/dL — ABNORMAL HIGH (ref 70–99)

## 2018-08-23 MED ORDER — INSULIN ASPART 100 UNIT/ML ~~LOC~~ SOLN
0.0000 [IU] | SUBCUTANEOUS | Status: DC
  Administered 2018-08-24 – 2018-08-25 (×2): 2 [IU] via SUBCUTANEOUS

## 2018-08-23 MED ORDER — ALBUTEROL SULFATE (2.5 MG/3ML) 0.083% IN NEBU: 2.5000 mg | INHALATION_SOLUTION | Freq: Four times a day (QID) | RESPIRATORY_TRACT | Status: DC | PRN

## 2018-08-23 MED ORDER — ACETAMINOPHEN 325 MG PO TABS: 650.0000 mg | ORAL_TABLET | Freq: Four times a day (QID) | ORAL | Status: DC | PRN

## 2018-08-23 MED ORDER — ACETAMINOPHEN 650 MG RE SUPP: 650.0000 mg | Freq: Four times a day (QID) | RECTAL | Status: DC | PRN

## 2018-08-23 MED ORDER — SODIUM CHLORIDE 0.9 % IV SOLN
INTRAVENOUS | Status: DC
  Administered 2018-08-23 – 2018-08-25 (×4): via INTRAVENOUS

## 2018-08-23 MED ORDER — POLYVINYL ALCOHOL 1.4 % OP SOLN
1.0000 [drp] | OPHTHALMIC | Status: DC | PRN
  Administered 2018-08-24: 1 [drp] via OPHTHALMIC
  Filled 2018-08-23: qty 15

## 2018-08-23 MED ORDER — ONDANSETRON HCL 4 MG PO TABS: 4.0000 mg | ORAL_TABLET | Freq: Four times a day (QID) | ORAL | Status: DC | PRN

## 2018-08-23 MED ORDER — IOHEXOL 300 MG/ML  SOLN
100.0000 mL | Freq: Once | INTRAMUSCULAR | Status: AC | PRN
  Administered 2018-08-23: 100 mL via INTRAVENOUS

## 2018-08-23 MED ORDER — ONDANSETRON HCL 4 MG/2ML IJ SOLN: 4.0000 mg | Freq: Four times a day (QID) | INTRAMUSCULAR | Status: DC | PRN

## 2018-08-23 MED ORDER — MORPHINE SULFATE (PF) 2 MG/ML IV SOLN: 0.5000 mg | INTRAVENOUS | Status: DC | PRN

## 2018-08-23 MED ORDER — HYDRALAZINE HCL 20 MG/ML IJ SOLN: 5.0000 mg | INTRAMUSCULAR | Status: DC | PRN

## 2018-08-23 MED ORDER — MOMETASONE FURO-FORMOTEROL FUM 200-5 MCG/ACT IN AERO
2.0000 | INHALATION_SPRAY | Freq: Two times a day (BID) | RESPIRATORY_TRACT | Status: DC
  Administered 2018-08-23 – 2018-08-25 (×5): 2 via RESPIRATORY_TRACT
  Filled 2018-08-23: qty 8.8

## 2018-08-23 MED ORDER — PANTOPRAZOLE SODIUM 40 MG IV SOLR
40.0000 mg | INTRAVENOUS | Status: DC
  Administered 2018-08-23 – 2018-08-25 (×3): 40 mg via INTRAVENOUS
  Filled 2018-08-23 (×3): qty 40

## 2018-08-23 NOTE — ED Provider Notes (Signed)
[facility] EMERGENCY DEPARTMENT Provider Note   CSN: [id number] Arrival date & time: 08/23/18  0030     History   Chief Complaint Chief Complaint  Patient presents with  . Constipation  . Abdominal Pain    HPI Heidi Dominguez is a 85 y.o. female.  85 yo F with a chief complaint of upper abdominal pain and constipation.  Going on for the past couple days.  Denies fevers or chills denies nausea or vomiting.  Denies cough or congestion.  Patient was in the hospital a couple months ago with similar illness.  She thinks it feels the same.  The history is provided by the patient.  Constipation  Associated symptoms: abdominal pain   Associated symptoms: no dysuria, no fever, no nausea and no vomiting   Abdominal Pain  Pain location:  Epigastric Pain quality: cramping and dull   Pain radiates to:  Does not radiate Pain severity:  Moderate Onset quality:  Gradual Duration:  2 days Timing:  Constant Progression:  Worsening Chronicity:  New Relieved by:  Nothing Worsened by:  Nothing Ineffective treatments:  None tried Associated symptoms: constipation   Associated symptoms: no chest pain, no chills, no dysuria, no fever, no nausea, no shortness of breath and no vomiting     Past Medical History:  Diagnosis Date  . Arthritis    "qwhere" (07/04/2018)  . Chronic back pain    "all over" (07/04/2018)  . Chronic diastolic CHF (congestive heart failure) ([place])   . Complication of anesthesia    "I have a hard time waking up"  . Degenerative joint disease of shoulder region   . Family history of adverse reaction to anesthesia    "daughter has the shakes when she wakes up"  . GERD (gastroesophageal reflux disease)   . Heart murmur   . High cholesterol   . History of hiatal hernia   . HOH (hard of hearing)   . Hypertension   . Osteoarthritis   . Phlebitis    "BLE"  . Type II diabetes mellitus [facility])     Patient Active Problem List   Diagnosis Date Noted   . Elevated LFTs 07/04/2018  . CKD (chronic kidney disease) stage 3, GFR 30-59 ml/min (HCC) 07/04/2018  . Upper airway cough syndrome 03/13/2018  . Sensorineural hearing loss (SNHL), bilateral 12/04/2016  . Post-nasal drainage 03/14/2016  . Presbycusis of both ears 03/14/2016  . Bilateral hearing loss 10/06/2015  . Bilateral impacted cerumen 10/06/2015  . 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 Subjective tinnitus of both ears 10/06/2015  . Choledocholithiasis with chronic cholecystitis, nonobstructing 04/11/2015  . Pre-operative cardiovascular examination, high risk surgery   . Epigastric abdominal pain 04/08/2015  . Acute gallstone pancreatitis s/p lap chole w [facility] 04/11/2015 04/08/2015  . Diabetes mellitus type 2, controlled ([place]) 04/08/2015  . Gallstone pancreatitis 04/08/2015  . Mitral regurgitation 04/08/2015  . Chronic diastolic CHF (congestive heart failure) ([place]) 04/08/2015  . Dyspnea 03/31/2015  . Ischemic colitis ([place]) 08/09/2012  . Colitis 08/07/2012  . Rectal bleeding 08/07/2012  . Weakness 09/24/2011  . Bradycardia 09/24/2011  . Acute lower UTI 09/24/2011  . Hematochezia 09/24/2011  . Essential hypertension 09/24/2011  . Dyslipidemia 09/24/2011    Past Surgical History:  Procedure Laterality Date  . ABDOMINAL HYSTERECTOMY    . BACK SURGERY    . CATARACT EXTRACTION W/ INTRAOCULAR LENS  IMPLANT, BILATERAL Bilateral   . FIXATION KYPHOPLASTY    . JOINT REPLACEMENT    .  LAPAROSCOPIC CHOLECYSTECTOMY SINGLE SITE WITH INTRAOPERATIVE CHOLANGIOGRAM N/A 04/11/2015   Procedure: LAPAROSCOPIC LYSIS OF ADHESIONS, LAPAROSCOPIC CHOLECYSTECTOMY WITH INTRAOPERATIVE CHOLANGIOGRAM;  Surgeon: Michael Boston, MD;  Location: [facility] ORS;  Service: General;  Laterality: N/A;  . SHOULDER OPEN ROTATOR CUFF REPAIR Bilateral   . TOTAL KNEE ARTHROPLASTY Bilateral      OB History   No obstetric history on file.      Home Medications    Prior to Admission medications     Medication Sig Start Date End Date Taking? Authorizing Provider  albuterol (PROVENTIL HFA;VENTOLIN HFA) 108 (90 Base) MCG/ACT inhaler Inhale into the lungs every 6 (six) hours as needed for wheezing or shortness of breath.    [provider]  albuterol (PROVENTIL) (2.5 MG/3ML) 0.083% nebulizer solution Take 3 mLs (2.5 mg total) by nebulization every 6 (six) hours as needed for wheezing or shortness of breath. 03/13/18   Tanda Rockers, MD  amLODipine (NORVASC) 10 MG tablet Take 1 tablet (10 mg total) by mouth daily. Patient taking differently: Take 5 mg by mouth daily.  04/13/15   Hongalgi, Lenis Dickinson, MD  aspirin 81 MG tablet Take 81 mg by mouth daily.    [provider]  budesonide-formoterol (SYMBICORT) 160-4.5 MCG/ACT inhaler Inhale 2 puffs into the lungs daily.  04/13/15   [provider]  famotidine (PEPCID) 20 MG tablet Take 20 mg by mouth at bedtime.    [provider]  Ferrous Gluconate (IRON) 240 (27 FE) MG TABS Take 1 tablet by mouth daily.    [provider]  furosemide (LASIX) 40 MG tablet Take 40 mg by mouth daily.     [provider]  JANUVIA 50 MG tablet Take 50 mg by mouth daily.  03/11/15   [provider]  loratadine (CLARITIN) 10 MG tablet Take 10 mg by mouth daily.    [provider]  potassium chloride (K-DUR,KLOR-CON) 10 MEQ tablet Take 10 mEq by mouth daily.  03/22/15   [provider]  predniSONE (DELTASONE) 10 MG tablet 4 tabs x2 days, 2 tab x 2 days, 1 tab x2 days then stop 07/10/18   Tanda Rockers, MD  vitamin B-12 (CYANOCOBALAMIN) 500 MCG tablet Take 1,000 mcg by mouth daily.     [provider]    Family History Family History  Problem Relation Age of Onset  . Hypertension Mother   . Diabetes Mellitus I Mother   . Hypertension Father     Social History Social History   Tobacco Use  . Smoking status: Never Smoker  . Smokeless tobacco: Never Used  Substance Use Topics  .  Alcohol use: No    Comment: rare  . Drug use: No     Allergies   Catapres [clonidine hcl]; Covera-hs [verapamil hcl]; Other; Sulfa antibiotics; Sulfamethoxazole; Verapamil; and Sulfasalazine   Review of Systems Review of Systems  Constitutional: Negative for chills and fever.  HENT: Negative for congestion and rhinorrhea.   Eyes: Negative for redness and visual disturbance.  Respiratory: Negative for shortness of breath and wheezing.   Cardiovascular: Negative for chest pain and palpitations.  Gastrointestinal: Positive for abdominal pain and constipation. Negative for nausea and vomiting.  Genitourinary: Negative for dysuria and urgency.  Musculoskeletal: Negative for arthralgias and myalgias.  Skin: Negative for pallor and wound.  Neurological: Negative for dizziness and headaches.     Physical Exam Updated Vital Signs BP (!) 179/79 (BP Location: Right Arm)   Pulse 80   Temp 97.9 F (36.6  C) (Oral)   Resp 16   Ht 5\' 3"  (1.6 m)   Wt 74.8 kg   SpO2 95%   BMI 29.23 kg/m   Physical Exam Vitals signs and nursing note reviewed.  Constitutional:      General: She is not in acute distress.    Appearance: She is well-developed. She is not diaphoretic.  HENT:     Head: Normocephalic and atraumatic.  Eyes:     Pupils: Pupils are equal, round, and reactive to light.  Neck:     Musculoskeletal: Normal range of motion and neck supple.  Cardiovascular:     Rate and Rhythm: Normal rate and regular rhythm.     Heart sounds: No murmur. No friction rub. No gallop.   Pulmonary:     Effort: Pulmonary effort is normal.     Breath sounds: No wheezing or rales.  Abdominal:     General: There is no distension.     Palpations: Abdomen is soft.     Tenderness: There is abdominal tenderness in the right upper quadrant, epigastric area and left upper quadrant.     Comments: Ventral hernia.  Mild diffuse upper abdominal tenderness  Musculoskeletal:        General: No tenderness.    Skin:    General: Skin is warm and dry.  Neurological:     Mental Status: She is alert and oriented to person, place, and time.  Psychiatric:        Behavior: Behavior normal.      ED Treatments / Results  Labs (all labs ordered are listed, but only abnormal results are displayed) Labs Reviewed  LIPASE, BLOOD - Abnormal; Notable for the following components:      Result Value   Lipase 3,080 (*)    All other components within normal limits  COMPREHENSIVE METABOLIC PANEL - Abnormal; Notable for the following components:   Sodium 133 (*)    Chloride 97 (*)    Glucose, Bld 258 (*)    Albumin 3.1 (*)    AST 460 (*)    ALT 260 (*)    Alkaline Phosphatase 527 (*)    GFR calc non Af Amer 48 (*)    GFR calc Af Amer 56 (*)    All other components within normal limits  CBC - Abnormal; Notable for the following components:   WBC 11.4 (*)    All other components within normal limits  URINALYSIS, ROUTINE W REFLEX MICROSCOPIC    EKG None  Radiology Dg Abdomen 1 View  Result Date: 08/23/2018 CLINICAL DATA:  85 y/o F; generalized abdominal pain and constipation for 2 days. Nausea without vomiting. EXAM: ABDOMEN - 1 VIEW COMPARISON:  07/04/2018 CT abdomen and pelvis. FINDINGS: Normal bowel gas pattern. Right upper quadrant cholecystectomy clips. Chronic compression deformities of lower thoracic and upper lumbar spine, T12-L3. L3 kyphoplasty. IMPRESSION: Normal bowel gas pattern. Electronically Signed   By: Kristine Garbe M.D.   On: 08/23/2018 02:02   Ct Abdomen Pelvis W Contrast  Result Date: 08/23/2018 CLINICAL DATA:  85 y/o F; generalized abdominal pain and constipation for 2 days. Nausea and vomiting. EXAM: CT ABDOMEN AND PELVIS WITH CONTRAST TECHNIQUE: Multidetector CT imaging of the abdomen and pelvis was performed using the standard protocol following bolus administration of intravenous contrast. CONTRAST:  126mL OMNIPAQUE IOHEXOL 300 MG/ML  SOLN COMPARISON:  07/04/2018  CT abdomen and pelvis FINDINGS: Lower chest: Cardiomegaly. Large hiatal hernia. Coronary, aortic valvular, and mitral annular calcific atherosclerosis. Hepatobiliary: Cholecystectomy. Intra  and extrahepatic biliary ductal dilatation with the common bile duct measuring up to 14 mm, stable from prior CT given differences in technique. No focal liver lesion identified. Pancreas: Unremarkable. No pancreatic ductal dilatation or surrounding inflammatory changes. Spleen: Normal in size without focal abnormality. Adrenals/Urinary Tract: Normal adrenal glands. Right kidney lower pole cyst measuring 23 mm. No urinary stone disease or hydronephrosis. Normal bladder. Stomach/Bowel: Stomach is within normal limits. Appendix not identified. No evidence of bowel wall thickening, distention, or inflammatory changes. Sigmoid diverticulosis without findings of acute diverticulitis. Vascular/Lymphatic: Aortic atherosclerosis. No enlarged abdominal or pelvic lymph nodes. Reproductive: Status post hysterectomy. No adnexal masses. Other: No abdominal wall hernia or abnormality. No abdominopelvic ascites. Musculoskeletal: Lumbar levocurvature with apex at T12-L1. stable T12-L3 compression deformities with L3 augmentation. Stable severe spondylosis with multilevel vacuum phenomenon and prominent facet arthropathy. IMPRESSION: 1. No acute process identified. 2. Large hiatal hernia. 3. Stable extensive intra and extrahepatic biliary ductal dilatation post cholecystectomy, probably compensatory. 4. Sigmoid diverticulosis without findings of acute diverticulitis. 5. Coronary and aortic Atherosclerosis (ICD10-I70.0). Stable cardiomegaly. 6. Stable T12-L3 compression deformities, L3 augmentation, and advanced lumbar spondylosis. Electronically Signed   By: Kristine Garbe M.D.   On: 08/23/2018 03:37    Procedures Procedures (including critical care time)  Medications Ordered in ED Medications  iohexol (OMNIPAQUE) 300 MG/ML  solution 100 mL (100 mLs Intravenous Contrast Given 08/23/18 0312)     Initial Impression / Assessment and Plan / ED Course  I have reviewed the triage vital signs and the nursing notes.  Pertinent labs & imaging results that were available during my care of the patient were reviewed by me and considered in my medical decision making (see chart for details).     85 yo F with a chief complaint of abdominal pain and constipation.  Going on for the past couple days.  Patient was recently in the hospital for concern for a common bile duct stone with dilated common bile duct and elevated LFTs.  At that time she had an MRCP that was negative and eventually she was discharged home.  The LFTs today are similar to prior however today her lipase is now 3000 worse before it was in the 100s.  Repeat CT scan today with similar common bile duct dilatation.  Final Clinical Impressions(s) / ED Diagnoses   Final diagnoses:  Acute biliary pancreatitis without infection or necrosis    ED Discharge Orders    None       Deno Etienne, DO 08/23/18 0401

## 2018-08-23 NOTE — Progress Notes (Addendum)
Progress Note    Heidi Dominguez  RJJ:[id number] DOB: Dec 29, 1920  DOA: 08/23/2018 PCP: Lajean Manes, MD    Brief Narrative:   Chief complaint: Abdominal pain  Medical records reviewed and are as summarized below:  Heidi Dominguez is an 85 y.o. female with past medical history significant for diastolic congestive heart failure, hypertension, hyperlipidemia, and diabetes mellitus type 2; who presents with abdominal pain.  Previously admitted in November 2019 for nonspecific elevation of liver enzymes found to have E. coli bacteremia secondary to UTI.  MRCP negative.  Assessment/Plan:   Principal Problem:   Abdominal pain Active Problems:   Essential hypertension   Ischemic colitis ([place])   Diabetes mellitus type 2, controlled ([place])   CKD (chronic kidney disease) stage 3, GFR 30-59 ml/min ([facility])  Abdominal pain, elevated lipase, and elevated liver enzyme 2/2 suspected gallstone: Acute. Patient reports having midline abdominal pain.  Previous history of cholecystectomy.  CT imaging showing a significantly dilated intra-and extrahepatic ducts.  Concern for gallstone pancreatitis. - Npo  - IV fluids at 78 mL/h - Appreciate gastroenterology services, will follow-up for further recommendation - Planned   Diastolic CHF: Last echocardiogram from 04/09/2015 noted EF of 60 to 65% with Grade 1 diastolic dysfunction. - Daily weight - Monitor intake and output  Chronic kidney disease stage II/III: Creatinine appears to be within normal limits at this time 0.86.   - Continue to monitor  History of COPD: Patient denies any respiratory complaints at this time.  Recent UTI: Patient completed antibiotics yesterday.  Body mass index is 29.23 kg/m.   Family Communication/Anticipated D/C date and plan/Code Status   DVT prophylaxis: Lovenox ordered. Code Status: Full Code.  Family Communication: Discussed plan of care with patient and family Disposition Plan: Likely discharge home once  medically stable   Medical Consultants:    GI, Dr. Cristina Gong   Anti-Infectives:    None  Subjective:   Patient reports that symptoms started acutely yesterday and she had swelling and soreness.  Objective:    Vitals:   08/23/18 0034 08/23/18 0043 08/23/18 0354 08/23/18 0624  BP: (!) 172/88  (!) 179/79 (!) 186/70  Pulse: 92  80 68  Resp: 14  16 18   Temp: 97.9 F (36.6 C)     TempSrc: Oral     SpO2: 96%  95% 95%  Weight:  74.8 kg    Height:  5\' 3"  (1.6 m)     No intake or output data in the 24 hours ending 08/23/18 0848 Filed Weights   08/23/18 0043  Weight: 74.8 kg    Exam: Constitutional: Elderly female NAD, calm, comfortable Eyes: PERRL, lids and conjunctivae normal ENMT: Mucous membranes are dry. Posterior pharynx clear of any exudate or lesions. .  Neck: normal, supple, no masses, no thyromegaly Respiratory: clear to auscultation bilaterally, no wheezing, no crackles. Normal respiratory effort. No accessory muscle use.  Cardiovascular: Regular rate and rhythm, no murmurs / rubs / gallops. No extremity edema. 2+ pedal pulses. No carotid bruits.  Abdomen: Tenderness to palpation of the right upper quadrant, no masses palpated. No hepatosplenomegaly. Bowel sounds positive.  Musculoskeletal: no clubbing / cyanosis. No joint deformity upper and lower extremities. Good ROM, no contractures. Normal muscle tone.  Skin: no rashes, lesions, ulcers. No induration Neurologic: CN 2-12 grossly intact. Sensation intact, DTR normal. Strength 5/5 in all 4.  Psychiatric: Normal judgment and insight. Alert and oriented x 3. Normal mood.    Data Reviewed:   I have  personally reviewed following labs and imaging studies:  Labs: Labs show the following:   Basic Metabolic Panel: Recent Labs  Lab 08/23/18 0047 08/23/18 0623  NA 133* 138  K 3.5 3.8  CL 97* 100  CO2 27 29  GLUCOSE 258* 141*  BUN 13 10  CREATININE 0.99 0.86  CALCIUM 9.7 9.9   GFR Estimated Creatinine  Clearance: 37.9 mL/min (by C-G formula based on SCr of 0.86 mg/dL). Liver Function Tests: Recent Labs  Lab 08/23/18 0047 08/23/18 0623  AST 460* 297*  ALT 260* 260*  ALKPHOS 527* 532*  BILITOT 1.1 0.9  PROT 7.3 7.3  ALBUMIN 3.1* 3.2*   Recent Labs  Lab 08/23/18 0047  LIPASE 3,080*   No results for input(s): AMMONIA in the last 168 hours. Coagulation profile No results for input(s): INR, PROTIME in the last 168 hours.  CBC: Recent Labs  Lab 08/23/18 0047 08/23/18 0623  WBC 11.4* 8.2  NEUTROABS  --  6.2  HGB 14.1 14.1  HCT 43.8 45.3  MCV 95.0 93.8  PLT 160 157   Cardiac Enzymes: Recent Labs  Lab 08/23/18 0623  TROPONINI <0.03   BNP (last 3 results) No results for input(s): PROBNP in the last 8760 hours. CBG: Recent Labs  Lab 08/23/18 0710  GLUCAP 115*   D-Dimer: No results for input(s): DDIMER in the last 72 hours. Hgb A1c: No results for input(s): HGBA1C in the last 72 hours. Lipid Profile: No results for input(s): CHOL, HDL, LDLCALC, TRIG, CHOLHDL, LDLDIRECT in the last 72 hours. Thyroid function studies: No results for input(s): TSH, T4TOTAL, T3FREE, THYROIDAB in the last 72 hours.  Invalid input(s): FREET3 Anemia work up: No results for input(s): VITAMINB12, FOLATE, FERRITIN, TIBC, IRON, RETICCTPCT in the last 72 hours. Sepsis Labs: Recent Labs  Lab 08/23/18 0047 08/23/18 0623  WBC 11.4* 8.2  LATICACIDVEN  --  1.4    Microbiology No results found for this or any previous visit (from the past 240 hour(s)).  Procedures and diagnostic studies:  Dg Abdomen 1 View  Result Date: 08/23/2018 CLINICAL DATA:  85 y/o F; generalized abdominal pain and constipation for 2 days. Nausea without vomiting. EXAM: ABDOMEN - 1 VIEW COMPARISON:  07/04/2018 CT abdomen and pelvis. FINDINGS: Normal bowel gas pattern. Right upper quadrant cholecystectomy clips. Chronic compression deformities of lower thoracic and upper lumbar spine, T12-L3. L3 kyphoplasty.  IMPRESSION: Normal bowel gas pattern. Electronically Signed   By: Kristine Garbe M.D.   On: 08/23/2018 02:02   Ct Abdomen Pelvis W Contrast  Result Date: 08/23/2018 CLINICAL DATA:  85 y/o F; generalized abdominal pain and constipation for 2 days. Nausea and vomiting. EXAM: CT ABDOMEN AND PELVIS WITH CONTRAST TECHNIQUE: Multidetector CT imaging of the abdomen and pelvis was performed using the standard protocol following bolus administration of intravenous contrast. CONTRAST:  164mL OMNIPAQUE IOHEXOL 300 MG/ML  SOLN COMPARISON:  07/04/2018 CT abdomen and pelvis FINDINGS: Lower chest: Cardiomegaly. Large hiatal hernia. Coronary, aortic valvular, and mitral annular calcific atherosclerosis. Hepatobiliary: Cholecystectomy. Intra and extrahepatic biliary ductal dilatation with the common bile duct measuring up to 14 mm, stable from prior CT given differences in technique. No focal liver lesion identified. Pancreas: Unremarkable. No pancreatic ductal dilatation or surrounding inflammatory changes. Spleen: Normal in size without focal abnormality. Adrenals/Urinary Tract: Normal adrenal glands. Right kidney lower pole cyst measuring 23 mm. No urinary stone disease or hydronephrosis. Normal bladder. Stomach/Bowel: Stomach is within normal limits. Appendix not identified. No evidence of bowel wall thickening, distention, or inflammatory  changes. Sigmoid diverticulosis without findings of acute diverticulitis. Vascular/Lymphatic: Aortic atherosclerosis. No enlarged abdominal or pelvic lymph nodes. Reproductive: Status post hysterectomy. No adnexal masses. Other: No abdominal wall hernia or abnormality. No abdominopelvic ascites. Musculoskeletal: Lumbar levocurvature with apex at T12-L1. stable T12-L3 compression deformities with L3 augmentation. Stable severe spondylosis with multilevel vacuum phenomenon and prominent facet arthropathy. IMPRESSION: 1. No acute process identified. 2. Large hiatal hernia. 3. Stable  extensive intra and extrahepatic biliary ductal dilatation post cholecystectomy, probably compensatory. 4. Sigmoid diverticulosis without findings of acute diverticulitis. 5. Coronary and aortic Atherosclerosis (ICD10-I70.0). Stable cardiomegaly. 6. Stable T12-L3 compression deformities, L3 augmentation, and advanced lumbar spondylosis. Electronically Signed   By: Kristine Garbe M.D.   On: 08/23/2018 03:37    Medications:   . insulin aspart  0-9 Units Subcutaneous Q4H  . mometasone-formoterol  2 puff Inhalation BID  . pantoprazole (PROTONIX) IV  40 mg Intravenous Q24H   Continuous Infusions:   LOS: 0 days   Rondell A Smith  [facility]   *Please refer to [email or web address], password [email or web address] to get updated schedule on who will round on this patient, as hospitalists switch teams weekly. If 7PM-7AM, please contact night-coverage at [email or web address], password [email or web address] for any overnight needs.

## 2018-08-23 NOTE — Consult Note (Addendum)
Referring Provider:  Dr. Hal Hope Primary Care Physician:  Lajean Manes, MD Primary Gastroenterologist: None (unassigned, but saw Dr. Paulita Fujita on last admission)  Reason for Consultation: Abdominal pain with elevated liver chemistries and lipase  HPI: Heidi Dominguez is a 85 y.o. female admitted through the emergency room early this morning with a 4-day history of diffuse abdominal pain which intensified substantially yesterday and was associated with abdominal distention.  In the emergency room, lipase was approximately 3000 and transaminases were moderately elevated in the 300 range.  The patient is feeling much better at this time, with her pain having decreased from a "10" yesterday to a "7" today.  The patient is remotely status post cholecystectomy.  The patient was in the hospital with E. coli bacteremia and apparent cholangitis back in November, at which time an MRCP was negative for choledocholithiasis and, based on the patient's improvement, and her advanced age, it was decided to manage her expectantly.  In fact, liver chemistries obtained at the office of her primary physician approximately 2 weeks post hospitalization, on December 2, showed essentially normal liver chemistries, AST 21 and ALT 64 (on the previous hospitalization, they had been higher than on the current admission, in the 1000 range, whereas lipase was less elevated, around 200).  A CT on this admission shows a normal pancreas and no obvious acute intra-abdominal abnormalities.  Past Medical History:  Diagnosis Date  . Arthritis    "qwhere" (07/04/2018)  . Chronic back pain    "all over" (07/04/2018)  . Chronic diastolic CHF (congestive heart failure) ([place])   . Complication of anesthesia    "I have a hard time waking up"  . Degenerative joint disease of shoulder region   . Family history of adverse reaction to anesthesia    "daughter has the shakes when she wakes up"  . GERD (gastroesophageal reflux disease)    . Heart murmur   . High cholesterol   . History of hiatal hernia   . HOH (hard of hearing)   . Hypertension   . Osteoarthritis   . Phlebitis    "BLE"  . Type II diabetes mellitus ([place])     Past Surgical History:  Procedure Laterality Date  . ABDOMINAL HYSTERECTOMY    . BACK SURGERY    . CATARACT EXTRACTION W/ INTRAOCULAR LENS  IMPLANT, BILATERAL Bilateral   . FIXATION KYPHOPLASTY    . JOINT REPLACEMENT    . LAPAROSCOPIC CHOLECYSTECTOMY SINGLE SITE WITH INTRAOPERATIVE CHOLANGIOGRAM N/A 04/11/2015   Procedure: LAPAROSCOPIC LYSIS OF ADHESIONS, LAPAROSCOPIC CHOLECYSTECTOMY WITH INTRAOPERATIVE CHOLANGIOGRAM;  Surgeon: Michael Boston, MD;  Location: [facility];  Service: General;  Laterality: N/A;  . SHOULDER OPEN ROTATOR CUFF REPAIR Bilateral   . TOTAL KNEE ARTHROPLASTY Bilateral     Prior to Admission medications   Medication Sig Start Date End Date Taking? Authorizing Provider  acetaminophen (TYLENOL) 325 MG tablet Take 650 mg by mouth daily as needed for mild pain.   Yes [provider]  albuterol (PROVENTIL HFA;VENTOLIN HFA) 108 (90 Base) MCG/ACT inhaler Inhale into the lungs every 6 (six) hours as needed for wheezing or shortness of breath.   Yes [provider]  albuterol (PROVENTIL) (2.5 MG/3ML) 0.083% nebulizer solution Take 3 mLs (2.5 mg total) by nebulization every 6 (six) hours as needed for wheezing or shortness of breath. 03/13/18  Yes Tanda Rockers, MD  amLODipine (NORVASC) 10 MG tablet Take 1 tablet (10 mg total) by mouth daily. Patient taking differently: Take 5 mg by mouth  daily.  04/13/15  Yes Hongalgi, Lenis Dickinson, MD  aspirin 81 MG tablet Take 81 mg by mouth daily.   Yes [provider]  budesonide-formoterol (SYMBICORT) 160-4.5 MCG/ACT inhaler Inhale 2 puffs into the lungs daily.  04/13/15  Yes [provider]  carboxymethylcellulose (REFRESH PLUS) 0.5 % SOLN Place 1 drop into both eyes daily as needed (dry eyes).   Yes [provider]  cephALEXin (KEFLEX) 500 MG capsule Take 500 mg by mouth 3 (three) times daily. Finished dose 08-22-2018   Yes [provider]  darifenacin (ENABLEX) 7.5 MG 24 hr tablet Take 7.5 mg by mouth daily.   Yes [provider]  famotidine (PEPCID) 20 MG tablet Take 20 mg by mouth at bedtime.   Yes [provider]  Ferrous Gluconate (IRON) 240 (27 FE) MG TABS Take 1 tablet by mouth daily.   Yes [provider]  folic acid (FOLVITE) 734 MCG tablet Take 400 mcg by mouth daily.   Yes [provider]  furosemide (LASIX) 40 MG tablet Take 40 mg by mouth daily.    Yes [provider]  ibuprofen (ADVIL,MOTRIN) 200 MG tablet Take 200 mg by mouth every 8 (eight) hours as needed for moderate pain.   Yes [provider]  JANUVIA 50 MG tablet Take 50 mg by mouth daily.  03/11/15  Yes [provider]  loratadine (CLARITIN) 10 MG tablet Take 10 mg by mouth daily.   Yes [provider]  Magnesium 250 MG TABS Take 1 tablet by mouth daily.   Yes [provider]  mometasone-formoterol (DULERA) 200-5 MCG/ACT AERO Inhale 2 puffs into the lungs 2 (two) times daily.   Yes [provider]  Omega-3 Fatty Acids (FISH OIL) 1000 MG CAPS Take 1 capsule by mouth daily.   Yes [provider]  pantoprazole (PROTONIX) 40 MG tablet Take 40 mg by mouth daily.   Yes [provider]  polyethylene glycol (MIRALAX / GLYCOLAX) packet Take 17 g by mouth daily as needed for mild constipation.   Yes [provider]  potassium chloride (K-DUR,KLOR-CON) 10 MEQ tablet Take 10 mEq by mouth daily.  03/22/15  Yes [provider]  vitamin B-12 (CYANOCOBALAMIN) 500 MCG tablet Take 1,000 mcg by mouth daily.    Yes [provider]  VITAMIN D, CHOLECALCIFEROL, PO Take 5,000 Units by mouth daily.   Yes [provider]  predniSONE (DELTASONE) 10 MG tablet 4 tabs x2 days, 2 tab x 2 days, 1 tab x2 days then  stop Patient not taking: Reported on 08/23/2018 07/10/18   Tanda Rockers, MD    Current Facility-Administered Medications  Medication Dose Route Frequency Provider Last Rate Last Dose  . 0.9 %  sodium chloride infusion   Intravenous Continuous Fuller Plan A, MD 75 mL/hr at 08/23/18 1017    . acetaminophen (TYLENOL) tablet 650 mg  650 mg Oral Q6H PRN Rise Patience, MD       Or  . acetaminophen (TYLENOL) suppository 650 mg  650 mg Rectal Q6H PRN Rise Patience, MD      . albuterol (PROVENTIL) (2.5 MG/3ML) 0.083% nebulizer solution 2.5 mg  2.5 mg Nebulization Q6H PRN Rise Patience, MD      . hydrALAZINE (APRESOLINE) injection 5 mg  5 mg Intravenous Q4H PRN Rise Patience, MD      . insulin aspart (novoLOG) injection 0-9 Units  0-9 Units Subcutaneous Q4H Rise Patience, MD      .  mometasone-formoterol (DULERA) 200-5 MCG/ACT inhaler 2 puff  2 puff Inhalation BID Rise Patience, MD   2 puff at 08/23/18 1014  . morphine 2 MG/ML injection 0.5 mg  0.5 mg Intravenous Q4H PRN Rise Patience, MD      . ondansetron [facility]) tablet 4 mg  4 mg Oral Q6H PRN Rise Patience, MD       Or  . ondansetron [facility]) injection 4 mg  4 mg Intravenous Q6H PRN Rise Patience, MD      . pantoprazole (PROTONIX) injection 40 mg  40 mg Intravenous Q24H Rise Patience, MD   40 mg at 08/23/18 0719    Allergies as of 08/23/2018 - Review Complete 08/23/2018  Allergen Reaction Noted  . Catapres [clonidine hcl] Other (See Comments) 09/24/2011  . Covera-hs [verapamil hcl] Other (See Comments) 09/24/2011  . Other Other (See Comments) 09/24/2011  . Sulfa antibiotics Itching 09/24/2011  . Sulfamethoxazole Diarrhea 10/06/2015  . Verapamil Diarrhea and Other (See Comments) 10/13/2015  . Sulfasalazine Itching 09/24/2011    Family History  Problem Relation Age of Onset  . Hypertension Mother   . Diabetes Mellitus I Mother   . Hypertension Father     Social  History   Socioeconomic History  . Marital status: Widowed    Spouse name: Not on file  . Number of children: Not on file  . Years of education: Not on file  . Highest education level: Not on file  Occupational History  . Occupation: retired  Scientific laboratory technician  . Financial resource strain: Not on file  . Food insecurity:    Worry: Not on file    Inability: Not on file  . Transportation needs:    Medical: Not on file    Non-medical: Not on file  Tobacco Use  . Smoking status: Never Smoker  . Smokeless tobacco: Never Used  Substance and Sexual Activity  . Alcohol use: No    Comment: rare  . Drug use: No  . Sexual activity: Not on file  Lifestyle  . Physical activity:    Days per week: Not on file    Minutes per session: Not on file  . Stress: Not on file  Relationships  . Social connections:    Talks on phone: Not on file    Gets together: Not on file    Attends religious service: Not on file    Active member of club or organization: Not on file    Attends meetings of clubs or organizations: Not on file    Relationship status: Not on file  . Intimate partner violence:    Fear of current or ex partner: Not on file    Emotionally abused: Not on file    Physically abused: Not on file    Forced sexual activity: Not on file  Other Topics Concern  . Not on file  Social History Narrative  . Not on file    Review of Systems: Last bowel movement was yesterday and was a "good" one.  The patient resides at home with the help of a caretaker and a devoted son is at the bedside.  A daughter is apparently healthcare power of attorney, however.  Physical Exam: Vital signs in last 24 hours: Temp:  [97.9 F (36.6 C)-99 F (37.2 C)] 99 F (37.2 C) (01/10 1121) Pulse Rate:  [68-92] 69 (01/10 1121) Resp:  [14-18] 18 (01/10 1121) BP: (161-186)/(60-88) 161/60 (01/10 1121) SpO2:  [95 %-100 %] 100 % (01/10 1121)  Weight:  [74.8 kg] 74.8 kg (01/10 0043) Last BM Date: 08/22/18  This is  a remarkably well-preserved appearing African-American female lying in bed in absolutely no distress.  She seems very cognitively intact despite her advanced age.  No pallor, no icterus.  Chest clear, heart without murmur or arrhythmia.  Abdomen has minimal upper abdominal tenderness, no guarding, no mass-effect, certainly no peritoneal signs.  Skin is warm and dry, no peripheral edema, no evident focal neurologic deficits, no skin rashes, does have some chronic arthritic joint deformities of the hands.  Intake/Output from previous day: No intake/output data recorded. Intake/Output this shift: No intake/output data recorded.  Lab Results: Recent Labs    08/23/18 0047 08/23/18 0623  WBC 11.4* 8.2  HGB 14.1 14.1  HCT 43.8 45.3  PLT 160 157   BMET Recent Labs    08/23/18 0047 08/23/18 0623  NA 133* 138  K 3.5 3.8  CL 97* 100  CO2 27 29  GLUCOSE 258* 141*  BUN 13 10  CREATININE 0.99 0.86  CALCIUM 9.7 9.9   LFT Recent Labs    08/23/18 0623  PROT 7.3  ALBUMIN 3.2*  AST 297*  ALT 260*  ALKPHOS 532*  BILITOT 0.9  BILIDIR 0.3*  IBILI 0.6   PT/INR No results for input(s): LABPROT, INR in the last 72 hours.  Studies/Results: Dg Abdomen 1 View  Result Date: 08/23/2018 CLINICAL DATA:  85 y/o F; generalized abdominal pain and constipation for 2 days. Nausea without vomiting. EXAM: ABDOMEN - 1 VIEW COMPARISON:  07/04/2018 CT abdomen and pelvis. FINDINGS: Normal bowel gas pattern. Right upper quadrant cholecystectomy clips. Chronic compression deformities of lower thoracic and upper lumbar spine, T12-L3. L3 kyphoplasty. IMPRESSION: Normal bowel gas pattern. Electronically Signed   By: Kristine Garbe M.D.   On: 08/23/2018 02:02   Ct Abdomen Pelvis W Contrast  Result Date: 08/23/2018 CLINICAL DATA:  85 y/o F; generalized abdominal pain and constipation for 2 days. Nausea and vomiting. EXAM: CT ABDOMEN AND PELVIS WITH CONTRAST TECHNIQUE: Multidetector CT imaging of the  abdomen and pelvis was performed using the standard protocol following bolus administration of intravenous contrast. CONTRAST:  171mL OMNIPAQUE IOHEXOL 300 MG/ML  SOLN COMPARISON:  07/04/2018 CT abdomen and pelvis FINDINGS: Lower chest: Cardiomegaly. Large hiatal hernia. Coronary, aortic valvular, and mitral annular calcific atherosclerosis. Hepatobiliary: Cholecystectomy. Intra and extrahepatic biliary ductal dilatation with the common bile duct measuring up to 14 mm, stable from prior CT given differences in technique. No focal liver lesion identified. Pancreas: Unremarkable. No pancreatic ductal dilatation or surrounding inflammatory changes. Spleen: Normal in size without focal abnormality. Adrenals/Urinary Tract: Normal adrenal glands. Right kidney lower pole cyst measuring 23 mm. No urinary stone disease or hydronephrosis. Normal bladder. Stomach/Bowel: Stomach is within normal limits. Appendix not identified. No evidence of bowel wall thickening, distention, or inflammatory changes. Sigmoid diverticulosis without findings of acute diverticulitis. Vascular/Lymphatic: Aortic atherosclerosis. No enlarged abdominal or pelvic lymph nodes. Reproductive: Status post hysterectomy. No adnexal masses. Other: No abdominal wall hernia or abnormality. No abdominopelvic ascites. Musculoskeletal: Lumbar levocurvature with apex at T12-L1. stable T12-L3 compression deformities with L3 augmentation. Stable severe spondylosis with multilevel vacuum phenomenon and prominent facet arthropathy. IMPRESSION: 1. No acute process identified. 2. Large hiatal hernia. 3. Stable extensive intra and extrahepatic biliary ductal dilatation post cholecystectomy, probably compensatory. 4. Sigmoid diverticulosis without findings of acute diverticulitis. 5. Coronary and aortic Atherosclerosis (ICD10-I70.0). Stable cardiomegaly. 6. Stable T12-L3 compression deformities, L3 augmentation, and advanced lumbar spondylosis. Electronically Signed  By:  Kristine Garbe M.D.   On: 08/23/2018 03:37    Impression: Whereas, on the previous admission about 6 weeks ago, the patient had a presentation most compatible with septic cholangitis, the current presentation is more suggestive of gallstone pancreatitis.  She does not appear to be that clinically compromised, so clinically, the pancreatitis is quite mild (also radiographically mild, to the point where the pancreas was read as "normal" on her CT scan in the emergency room).  Either way, this points to the likely presence of choledocholithiasis, past or present.  The patient has a rather dilated duct which might account for the fact that her MRCP was negative.  Plan: 1.  We will begin with an abdominal ultrasound which ordinarily has a low yield for detecting choledocholithiasis but, given the enlarged diameter of the CBD, may have greater sensitivity than is usually the case.  Moreover, the patient's family would like for her to avoid anesthesia, if possible.  2.  If the ultrasound shows choledocholithiasis, I believe the patient would be a very reasonable candidate for ERCP, sphincterotomy, and stone extraction despite her advanced age.  3.  If the ultrasound does not show choledocholithiasis, then a discussion will have to be had with the family about whether they would be agreeable to her undergoing endoscopic ultrasound (which is much more sensitive in detecting common duct stones), with the associated deep sedation, given that she has now had 2 episodes within the past 2 months of apparent bile duct related problems.  4.  In the meantime, we will monitor labs.  My hope and anticipation, based on the patient's good clinical appearance, is that she will have a fairly prompt drop in her lipase and perhaps be able to be started on clear liquids tomorrow.  5.  Ordinarily, aggressive IV fluids are given to patients with pancreatitis but, given the clinically mild character of her  presentation, and her advanced age, I feel that the current rate of infusion is clinically appropriate.  Bedside time for consult, including reviewing previous records and explaining biliary pathophysiology and various tests to patient and family, was approximately 25 minutes.   LOS: 0 days   Youlanda Mighty Danny Zimny  08/23/2018, 12:52 PM   Pager [phone] If no answer or after 5 PM call [phone]

## 2018-08-23 NOTE — H&P (Addendum)
History and Physical    SANORA CUNANAN YKD:[id number] DOB: Dec 14, 1920 DOA: 08/23/2018  PCP: Lajean Manes, MD  Patient coming from: Home.  Chief Complaint: Abdominal pain.  HPI: Heidi Dominguez is a 85 y.o. female with history of diastolic CHF, hypertension, hyperlipidemia, diabetes mellitus type 2 who was recently admitted in November 2019 for nonspecific elevation of LFTs with imaging showing intra-and extrahepatic biliary dilatation eventually was diagnosed with E. coli bacteremia secondary UTI and discharged home was recently placed on Keflex for 7 days for UTI because of dysuria and last dose was taken yesterday has been experiencing abdominal pain mostly in the epigastric area and 3 days of constipation with no nausea or vomiting denies any fever chills.  Pain increased on fatty food.  Pain is mostly in the epigastric area nonradiating.  Denies any chest pain.  Patient states he does take Tylenol every day.  ED Course: In the ER patient's labs revealed AST ALT of 460/260 with total bilirubin being normal and lipase was 3080.  CT of the abdomen and pelvis does not show anything acute in the abdomen except for known history of intra-and extrahepatic biliary dilatation.  Given the symptoms and marked elevated lipase patient has been admitted for further observation.  Review of Systems: As per HPI, rest all negative.   Past Medical History:  Diagnosis Date  . Arthritis    "qwhere" (07/04/2018)  . Chronic back pain    "all over" (07/04/2018)  . Chronic diastolic CHF (congestive heart failure) ([place])   . Complication of anesthesia    "I have a hard time waking up"  . Degenerative joint disease of shoulder region   . Family history of adverse reaction to anesthesia    "daughter has the shakes when she wakes up"  . GERD (gastroesophageal reflux disease)   . Heart murmur   . High cholesterol   . History of hiatal hernia   . HOH (hard of hearing)   . Hypertension   . Osteoarthritis     . Phlebitis    "BLE"  . Type II diabetes mellitus ([place])     Past Surgical History:  Procedure Laterality Date  . ABDOMINAL HYSTERECTOMY    . BACK SURGERY    . CATARACT EXTRACTION W/ INTRAOCULAR LENS  IMPLANT, BILATERAL Bilateral   . FIXATION KYPHOPLASTY    . JOINT REPLACEMENT    . LAPAROSCOPIC CHOLECYSTECTOMY SINGLE SITE WITH INTRAOPERATIVE CHOLANGIOGRAM N/A 04/11/2015   Procedure: LAPAROSCOPIC LYSIS OF ADHESIONS, LAPAROSCOPIC CHOLECYSTECTOMY WITH INTRAOPERATIVE CHOLANGIOGRAM;  Surgeon: Michael Boston, MD;  Location: [facility];  Service: General;  Laterality: N/A;  . SHOULDER OPEN ROTATOR CUFF REPAIR Bilateral   . TOTAL KNEE ARTHROPLASTY Bilateral      reports that she has never smoked. She has never used smokeless tobacco. She reports that she does not drink alcohol or use drugs.  Allergies  Allergen Reactions  . Catapres [Clonidine Hcl] Other (See Comments)    Unknown. Unknown.  Lorrin Goodell [Verapamil Hcl] Other (See Comments)    Unknown.  . Other Other (See Comments)    Any type of narcotics Feels "loopy" Any type of narcotics Feels "loopy"  . Sulfa Antibiotics Itching  . Sulfamethoxazole Diarrhea  . Verapamil Diarrhea and Other (See Comments)  . Sulfasalazine Itching    Family History  Problem Relation Age of Onset  . Hypertension Mother   . Diabetes Mellitus I Mother   . Hypertension Father     Prior to Admission medications   Medication Sig  Start Date End Date Taking? Authorizing Provider  acetaminophen (TYLENOL) 325 MG tablet Take 650 mg by mouth daily as needed for mild pain.   Yes [provider]  albuterol (PROVENTIL HFA;VENTOLIN HFA) 108 (90 Base) MCG/ACT inhaler Inhale into the lungs every 6 (six) hours as needed for wheezing or shortness of breath.   Yes [provider]  albuterol (PROVENTIL) (2.5 MG/3ML) 0.083% nebulizer solution Take 3 mLs (2.5 mg total) by nebulization every 6 (six) hours as needed for wheezing or shortness of breath.  03/13/18  Yes Tanda Rockers, MD  amLODipine (NORVASC) 10 MG tablet Take 1 tablet (10 mg total) by mouth daily. Patient taking differently: Take 5 mg by mouth daily.  04/13/15  Yes Hongalgi, Lenis Dickinson, MD  aspirin 81 MG tablet Take 81 mg by mouth daily.   Yes [provider]  budesonide-formoterol (SYMBICORT) 160-4.5 MCG/ACT inhaler Inhale 2 puffs into the lungs daily.  04/13/15  Yes [provider]  carboxymethylcellulose (REFRESH PLUS) 0.5 % SOLN Place 1 drop into both eyes daily as needed (dry eyes).   Yes [provider]  cephALEXin (KEFLEX) 500 MG capsule Take 500 mg by mouth 3 (three) times daily. Finished dose 08-22-2018   Yes [provider]  darifenacin (ENABLEX) 7.5 MG 24 hr tablet Take 7.5 mg by mouth daily.   Yes [provider]  famotidine (PEPCID) 20 MG tablet Take 20 mg by mouth at bedtime.   Yes [provider]  Ferrous Gluconate (IRON) 240 (27 FE) MG TABS Take 1 tablet by mouth daily.   Yes [provider]  folic acid (FOLVITE) 675 MCG tablet Take 400 mcg by mouth daily.   Yes [provider]  furosemide (LASIX) 40 MG tablet Take 40 mg by mouth daily.    Yes [provider]  ibuprofen (ADVIL,MOTRIN) 200 MG tablet Take 200 mg by mouth every 8 (eight) hours as needed for moderate pain.   Yes [provider]  JANUVIA 50 MG tablet Take 50 mg by mouth daily.  03/11/15  Yes [provider]  loratadine (CLARITIN) 10 MG tablet Take 10 mg by mouth daily.   Yes [provider]  Magnesium 250 MG TABS Take 1 tablet by mouth daily.   Yes [provider]  mometasone-formoterol (DULERA) 200-5 MCG/ACT AERO Inhale 2 puffs into the lungs 2 (two) times daily.   Yes [provider]  Omega-3 Fatty Acids (FISH OIL) 1000 MG CAPS Take 1 capsule by mouth daily.   Yes [provider]  pantoprazole (PROTONIX) 40 MG tablet Take 40 mg by mouth daily.   Yes [provider]    polyethylene glycol (MIRALAX / GLYCOLAX) packet Take 17 g by mouth daily as needed for mild constipation.   Yes [provider]  potassium chloride (K-DUR,KLOR-CON) 10 MEQ tablet Take 10 mEq by mouth daily.  03/22/15  Yes [provider]  vitamin B-12 (CYANOCOBALAMIN) 500 MCG tablet Take 1,000 mcg by mouth daily.    Yes [provider]  VITAMIN D, CHOLECALCIFEROL, PO Take 5,000 Units by mouth daily.   Yes [provider]  predniSONE (DELTASONE) 10 MG tablet 4 tabs x2 days, 2 tab x 2 days, 1 tab x2 days then stop Patient not taking: Reported on 08/23/2018 07/10/18   Tanda Rockers, MD    Physical Exam: Vitals:   08/23/18 0034 08/23/18 0043 08/23/18 0354  BP: (!) 172/88  (!) 179/79  Pulse: 92  80  Resp: 14  16  Temp: 97.9 F (36.6 C)    TempSrc: Oral    SpO2: 96%  95%  Weight:  74.8 kg   Height:  5\' 3"  (1.6 m)       Constitutional: Moderately built and nourished. Vitals:   08/23/18 0034 08/23/18 0043 08/23/18 0354  BP: (!) 172/88  (!) 179/79  Pulse: 92  80  Resp: 14  16  Temp: 97.9 F (36.6 C)    TempSrc: Oral    SpO2: 96%  95%  Weight:  74.8 kg   Height:  5\' 3"  (1.6 m)    Eyes: Anicteric no pallor. ENMT: No discharge from the ears eyes nose or mouth. Neck: No mass felt.  No neck rigidity but no JVD appreciated. Respiratory: No rhonchi or crepitations. Cardiovascular: S1-S2 heard no murmurs appreciated. Abdomen: Soft nontender bowel sounds present. Musculoskeletal: No edema.  No joint effusion. Skin: No rash. Neurologic: Alert awake oriented to time place and person.  Moves all extremities. Psychiatric: Appears normal per normal affect.   Labs on Admission: I have personally reviewed following labs and imaging studies  CBC: Recent Labs  Lab 08/23/18 0047  WBC 11.4*  HGB 14.1  HCT 43.8  MCV 95.0  PLT 350   Basic Metabolic Panel: Recent Labs  Lab 08/23/18 0047  NA 133*  K 3.5  CL 97*  CO2 27  GLUCOSE 258*  BUN 13   CREATININE 0.99  CALCIUM 9.7   GFR: Estimated Creatinine Clearance: 32.9 mL/min (by C-G formula based on SCr of 0.99 mg/dL). Liver Function Tests: Recent Labs  Lab 08/23/18 0047  AST 460*  ALT 260*  ALKPHOS 527*  BILITOT 1.1  PROT 7.3  ALBUMIN 3.1*   Recent Labs  Lab 08/23/18 0047  LIPASE 3,080*   No results for input(s): AMMONIA in the last 168 hours. Coagulation Profile: No results for input(s): INR, PROTIME in the last 168 hours. Cardiac Enzymes: No results for input(s): CKTOTAL, CKMB, CKMBINDEX, TROPONINI in the last 168 hours. BNP (last 3 results) No results for input(s): PROBNP in the last 8760 hours. HbA1C: No results for input(s): HGBA1C in the last 72 hours. CBG: No results for input(s): GLUCAP in the last 168 hours. Lipid Profile: No results for input(s): CHOL, HDL, LDLCALC, TRIG, CHOLHDL, LDLDIRECT in the last 72 hours. Thyroid Function Tests: No results for input(s): TSH, T4TOTAL, FREET4, T3FREE, THYROIDAB in the last 72 hours. Anemia Panel: No results for input(s): VITAMINB12, FOLATE, FERRITIN, TIBC, IRON, RETICCTPCT in the last 72 hours. Urine analysis:    Component Value Date/Time   COLORURINE STRAW (A) 08/23/2018 0355   APPEARANCEUR CLEAR 08/23/2018 0355   LABSPEC 1.010 08/23/2018 0355   PHURINE 6.0 08/23/2018 0355   GLUCOSEU NEGATIVE 08/23/2018 0355   HGBUR NEGATIVE 08/23/2018 0355   BILIRUBINUR NEGATIVE 08/23/2018 [place] 08/23/2018 0355   PROTEINUR NEGATIVE 08/23/2018 0355   UROBILINOGEN 1.0 04/07/2015 2312   NITRITE NEGATIVE 08/23/2018 0355   LEUKOCYTESUR NEGATIVE 08/23/2018 0355   Sepsis Labs: @LABRCNTIP (procalcitonin:4,lacticidven:4) )No results found for this or any previous visit (from the past 240 hour(s)).   Radiological Exams on Admission: Dg Abdomen 1 View  Result Date: 08/23/2018 CLINICAL DATA:  85 y/o F; generalized abdominal pain and constipation for 2 days. Nausea without vomiting. EXAM: ABDOMEN - 1 VIEW  COMPARISON:  07/04/2018 CT abdomen and pelvis. FINDINGS: Normal bowel gas pattern. Right upper quadrant cholecystectomy clips. Chronic compression deformities of lower thoracic and upper lumbar spine, T12-L3. L3 kyphoplasty. IMPRESSION: Normal bowel  gas pattern. Electronically Signed   By: Kristine Garbe M.D.   On: 08/23/2018 02:02   Ct Abdomen Pelvis W Contrast  Result Date: 08/23/2018 CLINICAL DATA:  85 y/o F; generalized abdominal pain and constipation for 2 days. Nausea and vomiting. EXAM: CT ABDOMEN AND PELVIS WITH CONTRAST TECHNIQUE: Multidetector CT imaging of the abdomen and pelvis was performed using the standard protocol following bolus administration of intravenous contrast. CONTRAST:  140mL OMNIPAQUE IOHEXOL 300 MG/ML  SOLN COMPARISON:  07/04/2018 CT abdomen and pelvis FINDINGS: Lower chest: Cardiomegaly. Large hiatal hernia. Coronary, aortic valvular, and mitral annular calcific atherosclerosis. Hepatobiliary: Cholecystectomy. Intra and extrahepatic biliary ductal dilatation with the common bile duct measuring up to 14 mm, stable from prior CT given differences in technique. No focal liver lesion identified. Pancreas: Unremarkable. No pancreatic ductal dilatation or surrounding inflammatory changes. Spleen: Normal in size without focal abnormality. Adrenals/Urinary Tract: Normal adrenal glands. Right kidney lower pole cyst measuring 23 mm. No urinary stone disease or hydronephrosis. Normal bladder. Stomach/Bowel: Stomach is within normal limits. Appendix not identified. No evidence of bowel wall thickening, distention, or inflammatory changes. Sigmoid diverticulosis without findings of acute diverticulitis. Vascular/Lymphatic: Aortic atherosclerosis. No enlarged abdominal or pelvic lymph nodes. Reproductive: Status post hysterectomy. No adnexal masses. Other: No abdominal wall hernia or abnormality. No abdominopelvic ascites. Musculoskeletal: Lumbar levocurvature with apex at T12-L1.  stable T12-L3 compression deformities with L3 augmentation. Stable severe spondylosis with multilevel vacuum phenomenon and prominent facet arthropathy. IMPRESSION: 1. No acute process identified. 2. Large hiatal hernia. 3. Stable extensive intra and extrahepatic biliary ductal dilatation post cholecystectomy, probably compensatory. 4. Sigmoid diverticulosis without findings of acute diverticulitis. 5. Coronary and aortic Atherosclerosis (ICD10-I70.0). Stable cardiomegaly. 6. Stable T12-L3 compression deformities, L3 augmentation, and advanced lumbar spondylosis. Electronically Signed   By: Kristine Garbe M.D.   On: 08/23/2018 03:37      Assessment/Plan Principal Problem:   Abdominal pain Active Problems:   Essential hypertension   Ischemic colitis ([place])   Diabetes mellitus type 2, controlled ([place])   CKD (chronic kidney disease) stage 3, GFR 30-59 ml/min ([place])    1. Abdominal pain mostly epigastric area with markedly elevated lipase and increasing LFTs -primary concerning for acute pancreatitis however CAT scan does not show anything acute.  Does show chronic extrahepatic and intrahepatic biliary dilatation.  Patient's chart also revealed that patient has history of ischemic colitis for which we will check lactate.  But CAT scan does not show any definite evidence of ischemia.  For now we will gently hydrate keep patient n.p.o.  Consult gastroenterology in the morning.  Repeat LFTs and in addition we will also check EKG and troponin.  Since patient also takes Tylenol every day will check Tylenol level.  Since patient's symptoms for last time was attributed to bacteremia blood cultures have been ordered. 2. Hypertension uncontrolled we will keep patient on PRN IV hydralazine since patient is n.p.o. 3. Diabetes mellitus type 2 we will keep patient on sliding scale coverage. 4. History of chronic kidney disease per the chart creatinine appears to be normal. 5. Was recently treated for UTI.   Last dose was yesterday.  UA appears unremarkable. 6. Possible COPD on inhalers.  Not wheezing.   DVT prophylaxis: SCDs. Code Status: Full code. Family Communication: Patient's family at the bedside. Disposition Plan: Home. Consults called: None. Admission status: Observation.   Rise Patience MD [facility] Pager [phone].  If 7PM-7AM, please contact night-coverage [email or web address] Password [email or web address]  08/23/2018, 5:42 AM

## 2018-08-23 NOTE — Progress Notes (Signed)
Prelim note--apparent GS pancreatitis.  Have ordered u/s for today to see if it visualizes a CBD stone.  Full note to follow.  Cleotis Nipper, M.D. Pager [phone] If no answer or after 5 PM call [phone]

## 2018-08-23 NOTE — ED Triage Notes (Signed)
Pt to ED with c/o generalized abd pain and constipation x's 2 days.  Nausea without vomiting  Last BM 2 days ago

## 2018-08-24 DIAGNOSIS — E785 Hyperlipidemia, unspecified: Secondary | ICD-10-CM | POA: Diagnosis not present

## 2018-08-24 DIAGNOSIS — M19019 Primary osteoarthritis, unspecified shoulder: Secondary | ICD-10-CM | POA: Diagnosis not present

## 2018-08-24 DIAGNOSIS — I5032 Chronic diastolic (congestive) heart failure: Secondary | ICD-10-CM | POA: Diagnosis not present

## 2018-08-24 DIAGNOSIS — R748 Abnormal levels of other serum enzymes: Secondary | ICD-10-CM | POA: Diagnosis not present

## 2018-08-24 DIAGNOSIS — M549 Dorsalgia, unspecified: Secondary | ICD-10-CM | POA: Diagnosis not present

## 2018-08-24 DIAGNOSIS — K219 Gastro-esophageal reflux disease without esophagitis: Secondary | ICD-10-CM | POA: Diagnosis not present

## 2018-08-24 DIAGNOSIS — I10 Essential (primary) hypertension: Secondary | ICD-10-CM | POA: Diagnosis not present

## 2018-08-24 DIAGNOSIS — K559 Vascular disorder of intestine, unspecified: Secondary | ICD-10-CM | POA: Diagnosis not present

## 2018-08-24 DIAGNOSIS — G8929 Other chronic pain: Secondary | ICD-10-CM | POA: Diagnosis not present

## 2018-08-24 DIAGNOSIS — E1165 Type 2 diabetes mellitus with hyperglycemia: Secondary | ICD-10-CM | POA: Diagnosis not present

## 2018-08-24 DIAGNOSIS — K851 Biliary acute pancreatitis without necrosis or infection: Secondary | ICD-10-CM | POA: Diagnosis not present

## 2018-08-24 DIAGNOSIS — R945 Abnormal results of liver function studies: Secondary | ICD-10-CM | POA: Diagnosis not present

## 2018-08-24 DIAGNOSIS — I130 Hypertensive heart and chronic kidney disease with heart failure and stage 1 through stage 4 chronic kidney disease, or unspecified chronic kidney disease: Secondary | ICD-10-CM | POA: Diagnosis not present

## 2018-08-24 DIAGNOSIS — N183 Chronic kidney disease, stage 3 (moderate): Secondary | ICD-10-CM | POA: Diagnosis not present

## 2018-08-24 DIAGNOSIS — E876 Hypokalemia: Secondary | ICD-10-CM | POA: Diagnosis not present

## 2018-08-24 DIAGNOSIS — E1122 Type 2 diabetes mellitus with diabetic chronic kidney disease: Secondary | ICD-10-CM | POA: Diagnosis not present

## 2018-08-24 LAB — COMPREHENSIVE METABOLIC PANEL
ALT: 146 U/L — ABNORMAL HIGH (ref 0–44)
AST: 102 U/L — ABNORMAL HIGH (ref 15–41)
Albumin: 2.7 g/dL — ABNORMAL LOW (ref 3.5–5.0)
Alkaline Phosphatase: 381 U/L — ABNORMAL HIGH (ref 38–126)
Anion gap: 8 (ref 5–15)
BUN: 8 mg/dL (ref 8–23)
CO2: 31 mmol/L (ref 22–32)
Calcium: 9.1 mg/dL (ref 8.9–10.3)
Chloride: 103 mmol/L (ref 98–111)
Creatinine, Ser: 0.79 mg/dL (ref 0.44–1.00)
GFR calc Af Amer: >60 mL/min (ref >60)
GFR calc non Af Amer: >60 mL/min (ref >60)
Glucose, Bld: 102 mg/dL — ABNORMAL HIGH (ref 70–99)
Potassium: 3.4 mmol/L — ABNORMAL LOW (ref 3.5–5.1)
Sodium: 142 mmol/L (ref 135–145)
Total Bilirubin: 0.8 mg/dL (ref 0.3–1.2)
Total Protein: 5.9 g/dL — ABNORMAL LOW (ref 6.5–8.1)

## 2018-08-24 LAB — CBC
HCT: 41.2 % (ref 36.0–46.0)
Hemoglobin: 13 g/dL (ref 12.0–15.0)
MCH: 30.2 pg (ref 26.0–34.0)
MCHC: 31.6 g/dL (ref 30.0–36.0)
MCV: 95.6 fL (ref 80.0–100.0)
Platelets: 148 10*3/uL — ABNORMAL LOW (ref 150–400)
RBC: 4.31 MIL/uL (ref 3.87–5.11)
RDW: 14.3 % (ref 11.5–15.5)
WBC: 7.9 10*3/uL (ref 4.0–10.5)
nRBC: 0 % (ref 0.0–0.2)

## 2018-08-24 LAB — LIPID PANEL
Cholesterol: 171 mg/dL (ref 0–200)
HDL: 50 mg/dL (ref >40)
LDL CALC: 103 mg/dL — AB (ref 0–99)
Total CHOL/HDL Ratio: 3.4 RATIO
Triglycerides: 89 mg/dL (ref <150)
VLDL: 18 mg/dL (ref 0–40)

## 2018-08-24 LAB — GLUCOSE, CAPILLARY
GLUCOSE-CAPILLARY: 101 mg/dL — AB (ref 70–99)
Glucose-Capillary: 100 mg/dL — ABNORMAL HIGH (ref 70–99)
Glucose-Capillary: 98 mg/dL (ref 70–99)
Glucose-Capillary: 124 mg/dL — ABNORMAL HIGH (ref 70–99)
Glucose-Capillary: 170 mg/dL — ABNORMAL HIGH (ref 70–99)
Glucose-Capillary: 156 mg/dL — ABNORMAL HIGH (ref 70–99)

## 2018-08-24 LAB — LIPASE, BLOOD: LIPASE: 95 U/L — AB (ref 11–51)

## 2018-08-24 MED ORDER — ENOXAPARIN SODIUM 40 MG/0.4ML ~~LOC~~ SOLN
40.0000 mg | SUBCUTANEOUS | Status: DC
  Administered 2018-08-24 – 2018-08-25 (×2): 40 mg via SUBCUTANEOUS
  Filled 2018-08-24 (×2): qty 0.4

## 2018-08-24 MED ORDER — POTASSIUM CHLORIDE CRYS ER 20 MEQ PO TBCR
40.0000 meq | EXTENDED_RELEASE_TABLET | ORAL | Status: AC
  Administered 2018-08-24: 40 meq via ORAL
  Filled 2018-08-24: qty 2

## 2018-08-24 NOTE — Progress Notes (Addendum)
Provided update to pt family at bedside per pt request   Please call pt daughter, [name], prior to discharge tomorrow  [phone]  Changed pt telemetry battery

## 2018-08-24 NOTE — Progress Notes (Signed)
This RN was informed about the IV "beeping".  This RN was on a call with a provider therefore another RN went to patient's room to assess IV pump.  Pt's son was yelling and ranting at RN.  This RN went to patient's room to talk with son and attempt to deescalate the situation when the son jumped up and started yelling that the IV pump was a nuisance and he then stated "it is not my job to silence machine".  Son also stated to this RN "I am the calm one and my sister is on the way and when she gets here you all will have your hands full cause she won't put up with this".  Son has come to the nurses station multiple times (hands on hips/huffing and puffing) and each time a staff member has been attentive to the situation.

## 2018-08-24 NOTE — Progress Notes (Signed)
RN went to room to round on pt  IV was beeping, placed pillow under arm and educated pt family to call when it alarms  Pt family at bedside states she has adjusted the machine "three times" RN verified correct rate of fluids Educated pt family on calling staff and not adjusting the equipment IV flushed, pt denies pain, no leaking, site clean dry and intact Will continue to monitor

## 2018-08-24 NOTE — Progress Notes (Addendum)
Progress Note    ASIA DUSENBURY  WGN:[id number] DOB: [birth year]  DOA: 08/23/2018 PCP: Lajean Manes, MD    Brief Narrative:   Chief complaint: Abdominal pain  Medical records reviewed and are as summarized below:  Heidi Dominguez is an 85 y.o. female with past medical history significant for diastolic congestive heart failure, hypertension, hyperlipidemia, and diabetes mellitus type 2; who presents with abdominal pain.  Previously admitted in November 2019 for nonspecific elevation of liver enzymes found to have E. coli bacteremia secondary to UTI.  MRCP previously negative.  Assessment/Plan:   Principal Problem:   Abdominal pain Active Problems:   Essential hypertension   Ischemic colitis ([place])   Diabetes mellitus type 2, controlled ([place])   CKD (chronic kidney disease) stage 3, GFR 30-59 ml/min ([facility])  Abdominal pain, elevated lipase, and elevated liver enzyme 2/2 suspected gallstone: Resolving. Patient reports having midline abdominal pain.  Previous history of cholecystectomy.  CT imaging showing a significantly dilated intra-and extrahepatic ducts.  Ultrasound does not show definitive stone.  Lipase down from 3080->95, AP 532-> 381, AST 297-> 102, ALT 260>146.  GI has concern for gallstone pancreatitis.  Patient currently asymptomatic.  - Clear liquid advance to heart healthy/carb modified diet - Continue IV fluids at 75 mL/h - Repeat CMP in a.m. - Appreciate gastroenterology services, will follow-up for further recommendation  Hypokalemia: Acute.  Potassium 3.4 this a.m. - Give 40 mEq of potassium chloride - Continue to monitor and replace as needed  Diabetes mellitus type 2: Patient well-controlled on and takes Januvia at home. - Hold Januvia - Continue to monitor blood glucose.  Diastolic CHF: Last echocardiogram from 04/09/2015 noted EF of 60 to 65% with Grade 1 diastolic dysfunction. - Daily weight - Monitor intake and output  Chronic kidney disease stage  II/III: Creatinine appears to be within normal limits at this time 0.86.   - Continue to monitor  History of COPD: Patient denies any respiratory complaints at this time.  Recent UTI: Patient completed antibiotics on 1/9.  Repeat urinalysis on admission negative.  Body mass index is 31.16 kg/m.   Family Communication/Anticipated D/C date and plan/Code Status   DVT prophylaxis: Lovenox ordered. Code Status: Full Code.  Family Communication: Discussed plan of care with patient and family Disposition Plan: Likely discharge home once medically stable   Medical Consultants:    Gastroenterology.   Anti-Infectives:    None  Subjective:   Patient reports that symptoms have resolved and she has had no abdominal pain overnight.  She was able to tolerate clear liquid diets with no issues and had a bowel movement.  Family makes note that patient's machine has been beeping for her IV fluids and no one has come in over 30 minutes.  Objective:    Vitals:   08/23/18 1614 08/23/18 2357 08/24/18 0500 08/24/18 0622  BP: (!) 156/96 (!) 116/58  (!) 157/53  Pulse: 78 69  68  Resp: 17 18  18   Temp: 99.5 F (37.5 C) 98.3 F (36.8 C)  98.3 F (36.8 C)  TempSrc: Oral Oral  Oral  SpO2: 96% 94%  97%  Weight:   79.8 kg   Height:        Intake/Output Summary (Last 24 hours) at 08/24/2018 0701 Last data filed at 08/24/2018 0649 Gross per 24 hour  Intake 1489.9 ml  Output 750 ml  Net 739.9 ml   Filed Weights   08/23/18 0043 08/24/18 0500  Weight: 74.8 kg 79.8 kg  Exam: Constitutional: Elderly female NAD, calm, comfortable Eyes: PERRL, lids and conjunctivae normal ENMT: Mucous membranes are moist. Posterior pharynx clear of any exudate or lesions.  Neck: normal, supple, no masses, no thyromegaly Respiratory: clear to auscultation bilaterally, no wheezing, no crackles. Normal respiratory effort. No accessory muscle use.  Cardiovascular: Regular rate and rhythm, no murmurs / rubs /  gallops. No extremity edema. 2+ pedal pulses. No carotid bruits.  Abdomen: no tenderness, no masses palpated. No hepatosplenomegaly. Bowel sounds positive.  Musculoskeletal: no clubbing / cyanosis. No joint deformity upper and lower extremities. Good ROM, no contractures. Normal muscle tone.  Skin: no rashes, lesions, ulcers. No induration Neurologic: CN 2-12 grossly intact. Sensation intact, DTR normal. Strength 5/5 in all 4.  Psychiatric: Normal judgment and insight. Alert and oriented x 3. Normal mood.    Data Reviewed:   I have personally reviewed following labs and imaging studies:  Labs: Labs show the following:   Basic Metabolic Panel: Recent Labs  Lab 08/23/18 0047 08/23/18 0623 08/24/18 0602  NA 133* 138 142  K 3.5 3.8 3.4*  CL 97* 100 103  CO2 27 29 31   GLUCOSE 258* 141* 102*  BUN 13 10 8   CREATININE 0.99 0.86 0.79  CALCIUM 9.7 9.9 9.1   GFR Estimated Creatinine Clearance: 42.1 mL/min (by C-G formula based on SCr of 0.79 mg/dL). Liver Function Tests: Recent Labs  Lab 08/23/18 0047 08/23/18 0623 08/24/18 0602  AST 460* 297* 102*  ALT 260* 260* 146*  ALKPHOS 527* 532* 381*  BILITOT 1.1 0.9 0.8  PROT 7.3 7.3 5.9*  ALBUMIN 3.1* 3.2* 2.7*   Recent Labs  Lab 08/23/18 0047 08/24/18 0602  LIPASE 3,080* 95*   No results for input(s): AMMONIA in the last 168 hours. Coagulation profile No results for input(s): INR, PROTIME in the last 168 hours.  CBC: Recent Labs  Lab 08/23/18 0047 08/23/18 0623 08/24/18 0602  WBC 11.4* 8.2 7.9  NEUTROABS  --  6.2  --   HGB 14.1 14.1 13.0  HCT 43.8 45.3 41.2  MCV 95.0 93.8 95.6  PLT 160 157 148*   Cardiac Enzymes: Recent Labs  Lab 08/23/18 0623  TROPONINI <0.03   BNP (last 3 results) No results for input(s): PROBNP in the last 8760 hours. CBG: Recent Labs  Lab 08/23/18 1614 08/23/18 1959 08/23/18 2245 08/24/18 0108 08/24/18 0544  GLUCAP 106* 137* 114* 101* 100*   D-Dimer: No results for input(s):  DDIMER in the last 72 hours. Hgb A1c: No results for input(s): HGBA1C in the last 72 hours. Lipid Profile: No results for input(s): CHOL, HDL, LDLCALC, TRIG, CHOLHDL, LDLDIRECT in the last 72 hours. Thyroid function studies: No results for input(s): TSH, T4TOTAL, T3FREE, THYROIDAB in the last 72 hours.  Invalid input(s): FREET3 Anemia work up: No results for input(s): VITAMINB12, FOLATE, FERRITIN, TIBC, IRON, RETICCTPCT in the last 72 hours. Sepsis Labs: Recent Labs  Lab 08/23/18 0047 08/23/18 0623 08/24/18 0602  WBC 11.4* 8.2 7.9  LATICACIDVEN  --  1.4  --     Microbiology Recent Results (from the past 240 hour(s))  Culture, blood (routine x 2)     Status: None (Preliminary result)   Collection Time: 08/23/18  7:49 AM  Result Value Ref Range Status   Specimen Description BLOOD RIGHT HAND  Final   Special Requests   Final    BOTTLES DRAWN AEROBIC AND ANAEROBIC Blood Culture adequate volume   Culture   Final    NO GROWTH < 24 HOURS  Performed at [facility], [place] [address], [place], [place] [address]    Report Status PENDING  Incomplete  Culture, blood (routine x 2)     Status: None (Preliminary result)   Collection Time: 08/23/18  7:57 AM  Result Value Ref Range Status   Specimen Description BLOOD LEFT ANTECUBITAL  Final   Special Requests   Final    BOTTLES DRAWN AEROBIC AND ANAEROBIC Blood Culture adequate volume   Culture   Final    NO GROWTH < 24 HOURS Performed at [facility], [place] [address], [place], [place] [address]    Report Status PENDING  Incomplete    Procedures and diagnostic studies:  Dg Abdomen 1 View  Result Date: 08/23/2018 CLINICAL DATA:  85 y/o F; generalized abdominal pain and constipation for 2 days. Nausea without vomiting. EXAM: ABDOMEN - 1 VIEW COMPARISON:  07/04/2018 CT abdomen and pelvis. FINDINGS: Normal bowel gas pattern. Right upper quadrant cholecystectomy clips. Chronic compression deformities of lower thoracic and upper  lumbar spine, T12-L3. L3 kyphoplasty. IMPRESSION: Normal bowel gas pattern. Electronically Signed   By: Kristine Garbe M.D.   On: 08/23/2018 02:02   Ct Abdomen Pelvis W Contrast  Result Date: 08/23/2018 CLINICAL DATA:  85 y/o F; generalized abdominal pain and constipation for 2 days. Nausea and vomiting. EXAM: CT ABDOMEN AND PELVIS WITH CONTRAST TECHNIQUE: Multidetector CT imaging of the abdomen and pelvis was performed using the standard protocol following bolus administration of intravenous contrast. CONTRAST:  134mL OMNIPAQUE IOHEXOL 300 MG/ML  SOLN COMPARISON:  07/04/2018 CT abdomen and pelvis FINDINGS: Lower chest: Cardiomegaly. Large hiatal hernia. Coronary, aortic valvular, and mitral annular calcific atherosclerosis. Hepatobiliary: Cholecystectomy. Intra and extrahepatic biliary ductal dilatation with the common bile duct measuring up to 14 mm, stable from prior CT given differences in technique. No focal liver lesion identified. Pancreas: Unremarkable. No pancreatic ductal dilatation or surrounding inflammatory changes. Spleen: Normal in size without focal abnormality. Adrenals/Urinary Tract: Normal adrenal glands. Right kidney lower pole cyst measuring 23 mm. No urinary stone disease or hydronephrosis. Normal bladder. Stomach/Bowel: Stomach is within normal limits. Appendix not identified. No evidence of bowel wall thickening, distention, or inflammatory changes. Sigmoid diverticulosis without findings of acute diverticulitis. Vascular/Lymphatic: Aortic atherosclerosis. No enlarged abdominal or pelvic lymph nodes. Reproductive: Status post hysterectomy. No adnexal masses. Other: No abdominal wall hernia or abnormality. No abdominopelvic ascites. Musculoskeletal: Lumbar levocurvature with apex at T12-L1. stable T12-L3 compression deformities with L3 augmentation. Stable severe spondylosis with multilevel vacuum phenomenon and prominent facet arthropathy. IMPRESSION: 1. No acute process  identified. 2. Large hiatal hernia. 3. Stable extensive intra and extrahepatic biliary ductal dilatation post cholecystectomy, probably compensatory. 4. Sigmoid diverticulosis without findings of acute diverticulitis. 5. Coronary and aortic Atherosclerosis (ICD10-I70.0). Stable cardiomegaly. 6. Stable T12-L3 compression deformities, L3 augmentation, and advanced lumbar spondylosis. Electronically Signed   By: Kristine Garbe M.D.   On: 08/23/2018 03:37   US Abdomen Limited Ruq  Result Date: 08/23/2018 CLINICAL DATA:  Elevated LFTs.  Cholecystectomy. EXAM: ULTRASOUND ABDOMEN LIMITED RIGHT UPPER QUADRANT COMPARISON:  CT 08/23/2018, 09/03/2017.  MRI 07/04/2018. FINDINGS: Gallbladder: Cholecystectomy. Common bile duct: Diameter: 15.5 mm.Similar findings noted on prior CT. Intrahepatic biliary ductal dilatation also again noted. Similar findings noted on prior CT. Liver: No focal hepatic abnormalities identified. Echotexture normal. Portal vein is patent on color Doppler imaging with normal direction of blood flow towards the liver. IMPRESSION: 1. Cholecystectomy. Dilated common bile duct and intrahepatic biliary ductal system again noted. Similar findings noted  on prior studies. 2. No focal hepatic abnormality identified. Electronically Signed   By: Marcello Moores  Register   On: 08/23/2018 13:05    Medications:   . enoxaparin (LOVENOX) injection  40 mg Subcutaneous Q24H  . insulin aspart  0-9 Units Subcutaneous Q4H  . mometasone-formoterol  2 puff Inhalation BID  . pantoprazole (PROTONIX) IV  40 mg Intravenous Q24H  . potassium chloride  40 mEq Oral STAT   Continuous Infusions: . sodium chloride 75 mL/hr at 08/23/18 2150     LOS: 0 days    A   [facility]   *Please refer to [email or web address], password [email or web address] to get updated schedule on who will round on this patient, as hospitalists switch teams weekly. If 7PM-7AM, please contact night-coverage at [email or web address], password [email or web address] for any  overnight needs.

## 2018-08-24 NOTE — Plan of Care (Signed)
  Problem: Clinical Measurements: Goal: Ability to maintain clinical measurements within normal limits will improve Outcome: Progressing   

## 2018-08-24 NOTE — Progress Notes (Signed)
RN went to pt's room to stop IV from beeping. Pt's son stood up and immediately begin to rudely proclaim that he silenced IV three times and that this "wasn't his job." Pt's son began waving his arms around in anger and continued to rant at [employer]. RN apologized to the pt and her son and explained to pt's son that although she wasn't his mother's nurse she stopped in to check the IV because the pt's nurse was occupied in another room. Pt's son stated that he didn't care if she was occupied; she should come to his mother's room immediately. RN notified charge RN of pt's son's actions. Charge RN states she will speak to the pt's son.

## 2018-08-24 NOTE — Progress Notes (Signed)
[facility] Gastroenterology Progress Note  CHELCIE ESTORGA 85 y.o. 1921/02/07   Subjective: Feels good. Denies abdominal pain. Eating solid food. Son in room.  Objective: Vital signs: Vitals:   08/23/18 2357 08/24/18 0622  BP: (!) 116/58 (!) 157/53  Pulse: 69 68  Resp: 18 18  Temp: 98.3 F (36.8 C) 98.3 F (36.8 C)  SpO2: 94% 97%    Physical Exam: Gen: elderly, alert, no acute distress, well-nourished HEENT: anicteric sclera CV: RRR Chest: CTA B Abd: soft, nontender, nondistended, +BS   Lab Results: Recent Labs    08/23/18 0623 08/24/18 0602  NA 138 142  K 3.8 3.4*  CL 100 103  CO2 29 31  GLUCOSE 141* 102*  BUN 10 8  CREATININE 0.86 0.79  CALCIUM 9.9 9.1   Recent Labs    08/23/18 0623 08/24/18 0602  AST 297* 102*  ALT 260* 146*  ALKPHOS 532* 381*  BILITOT 0.9 0.8  PROT 7.3 5.9*  ALBUMIN 3.2* 2.7*   Recent Labs    08/23/18 0623 08/24/18 0602  WBC 8.2 7.9  NEUTROABS 6.2  --   HGB 14.1 13.0  HCT 45.3 41.2  MCV 93.8 95.6  PLT 157 148*      Assessment/Plan: Gallstone pancreatitis - improving LFTs and normal lipase now. Dilated CBD and intrahepatic ductal system on U/S. May have passed a stone. Clinically has improved and agree with POs. No need for EUS right now and may not need as an outpt if LFTs remain in the normal range. Would manage conservatively due to her age and comorbidities. If LFTs continue to improve, then ok for d/c tomorrow and f/u as an outpt.   Lear Ng 08/24/2018, 1:23 PM  Questions please call [phone] ID: [name], female   DOB: [birth year], 85 y.o.   MRN: [id number]

## 2018-08-25 DIAGNOSIS — K859 Acute pancreatitis without necrosis or infection, unspecified: Secondary | ICD-10-CM | POA: Diagnosis not present

## 2018-08-25 DIAGNOSIS — K861 Other chronic pancreatitis: Secondary | ICD-10-CM | POA: Diagnosis not present

## 2018-08-25 DIAGNOSIS — K831 Obstruction of bile duct: Secondary | ICD-10-CM | POA: Diagnosis not present

## 2018-08-25 DIAGNOSIS — I5032 Chronic diastolic (congestive) heart failure: Secondary | ICD-10-CM | POA: Diagnosis not present

## 2018-08-25 DIAGNOSIS — R109 Unspecified abdominal pain: Secondary | ICD-10-CM | POA: Diagnosis not present

## 2018-08-25 DIAGNOSIS — E1122 Type 2 diabetes mellitus with diabetic chronic kidney disease: Secondary | ICD-10-CM | POA: Diagnosis not present

## 2018-08-25 DIAGNOSIS — I10 Essential (primary) hypertension: Secondary | ICD-10-CM | POA: Diagnosis not present

## 2018-08-25 DIAGNOSIS — K851 Biliary acute pancreatitis without necrosis or infection: Secondary | ICD-10-CM | POA: Diagnosis not present

## 2018-08-25 DIAGNOSIS — E1165 Type 2 diabetes mellitus with hyperglycemia: Secondary | ICD-10-CM | POA: Diagnosis not present

## 2018-08-25 DIAGNOSIS — R945 Abnormal results of liver function studies: Secondary | ICD-10-CM | POA: Diagnosis not present

## 2018-08-25 DIAGNOSIS — N183 Chronic kidney disease, stage 3 (moderate): Secondary | ICD-10-CM | POA: Diagnosis not present

## 2018-08-25 DIAGNOSIS — I130 Hypertensive heart and chronic kidney disease with heart failure and stage 1 through stage 4 chronic kidney disease, or unspecified chronic kidney disease: Secondary | ICD-10-CM | POA: Diagnosis not present

## 2018-08-25 DIAGNOSIS — R748 Abnormal levels of other serum enzymes: Secondary | ICD-10-CM | POA: Diagnosis not present

## 2018-08-25 LAB — COMPREHENSIVE METABOLIC PANEL
ALT: 100 U/L — ABNORMAL HIGH (ref 0–44)
AST: 52 U/L — ABNORMAL HIGH (ref 15–41)
Albumin: 2.5 g/dL — ABNORMAL LOW (ref 3.5–5.0)
Alkaline Phosphatase: 337 U/L — ABNORMAL HIGH (ref 38–126)
Anion gap: 6 (ref 5–15)
BUN: 7 mg/dL — ABNORMAL LOW (ref 8–23)
CALCIUM: 8.8 mg/dL — AB (ref 8.9–10.3)
CHLORIDE: 105 mmol/L (ref 98–111)
CO2: 27 mmol/L (ref 22–32)
CREATININE: 0.65 mg/dL (ref 0.44–1.00)
GFR calc Af Amer: >60 mL/min (ref >60)
GFR calc non Af Amer: >60 mL/min (ref >60)
Glucose, Bld: 122 mg/dL — ABNORMAL HIGH (ref 70–99)
Potassium: 3.3 mmol/L — ABNORMAL LOW (ref 3.5–5.1)
Sodium: 138 mmol/L (ref 135–145)
Total Bilirubin: 0.7 mg/dL (ref 0.3–1.2)
Total Protein: 5.9 g/dL — ABNORMAL LOW (ref 6.5–8.1)

## 2018-08-25 LAB — GLUCOSE, CAPILLARY
Glucose-Capillary: 110 mg/dL — ABNORMAL HIGH (ref 70–99)
Glucose-Capillary: 120 mg/dL — ABNORMAL HIGH (ref 70–99)
Glucose-Capillary: 168 mg/dL — ABNORMAL HIGH (ref 70–99)

## 2018-08-25 MED ORDER — POTASSIUM CHLORIDE CRYS ER 20 MEQ PO TBCR
40.0000 meq | EXTENDED_RELEASE_TABLET | ORAL | Status: AC
  Administered 2018-08-25: 40 meq via ORAL
  Filled 2018-08-25: qty 2

## 2018-08-25 MED ORDER — FUROSEMIDE 40 MG PO TABS: 20.0000 mg | ORAL_TABLET | Freq: Every day | ORAL | 0 refills | Status: DC

## 2018-08-25 MED ORDER — PANTOPRAZOLE SODIUM 40 MG PO TBEC: 40.0000 mg | DELAYED_RELEASE_TABLET | Freq: Every day | ORAL | Status: DC

## 2018-08-25 NOTE — Discharge Instructions (Signed)
Pancreatitis Eating Plan  Pancreatitis is when your pancreas becomes irritated and swollen (inflamed). The pancreas is a small organ located behind your stomach. It helps your body digest food and regulate your blood sugar. Pancreatitis can affect how your body digests food, especially foods with fat. You may also have other symptoms such as abdominal pain or nausea.  When you have pancreatitis, following a low-fat eating plan may help you manage symptoms and recover more quickly. Work with your health care provider or a diet and nutrition specialist (dietitian) to create an eating plan that is right for you.  What are tips for following this plan?  Reading food labels  Use the information on food labels to help keep track of how much fat you eat:   Check the serving size.   Look for the amount of total fat in grams (g) in one serving.  ? Low-fat foods have 3 g of fat or less per serving.  ? Fat-free foods have 0.5 g of fat or less per serving.   Keep track of how much fat you eat based on how many servings you eat.  ? For example, if you eat two servings, the amount of fat you eat will be two times what is listed on the label.  Shopping     Buy low-fat or nonfat foods, such as:  ? Fresh, frozen, or canned fruits and vegetables.  ? Grains, including pasta, bread, and rice.  ? Lean meat, poultry, fish, and other protein foods.  ? Low-fat or nonfat dairy.   Avoid buying bakery products and other sweets made with whole milk, butter, and eggs.   Avoid buying snack foods with added fat, such as anything with butter or cheese flavoring.  Cooking   Remove skin from poultry, and remove extra fat from meat.   Limit the amount of fat and oil you use to 6 teaspoons or less per day.   Cook using low-fat methods, such as boiling, broiling, grilling, steaming, or baking.   Use spray oil to cook. Add fat-free chicken broth to add flavor and moisture.   Avoid adding cream to thicken soups or sauces. Use other thickeners  such as corn starch or tomato paste.  Meal planning     Eat a low-fat diet as told by your dietitian. For most people, this means having no more than 55-65 grams of fat each day.   Eat small, frequent meals throughout the day. For example, you may have 5-6 small meals instead of 3 large meals.   Drink enough fluid to keep your urine pale yellow.   Do not drink alcohol. Talk to your health care provider if you need help stopping.   Limit how much caffeine you have, including black coffee, black and green tea, caffeinated soft drinks, and energy drinks.  General information   Let your health care provider or dietitian know if you have unplanned weight loss on this eating plan.   You may be instructed to follow a clear liquid diet during a flare of symptoms. Talk with your health care provider about how to manage your diet during symptoms of a flare.   Take any vitamins or supplements as told by your health care provider.   Work with a dietitian, especially if you have other conditions such as obesity or diabetes mellitus.  What foods should I avoid?  Fruits  Fried fruits. Fruits served with butter or cream.  Vegetables  Fried vegetables. Vegetables cooked with butter, cheese, or   cream.  Grains  Biscuits, waffles, donuts, pastries, and croissants. Pies and cookies. Butter-flavored popcorn. Regular crackers.  Meats and other protein foods  Fatty cuts of meat. Poultry with skin. Organ meats. Bacon, sausage, and cold cuts. Whole eggs. Nuts and nut butters.  Dairy  Whole and 2% milk. Whole milk yogurt. Whole milk ice cream. Cream and half-and-half. Cream cheese. Sour cream. Cheese.  Beverages  Wine, beer, and liquor.  The items listed above may not be a complete list of foods and beverages to avoid. Contact a dietitian for more information.  Summary   Pancreatitis can affect how your body digests food, especially foods with fat.   When you have pancreatitis, it is recommended that you follow a low-fat eating  plan to help you recover more quickly and manage symptoms. For most people, this means limiting fat to no more than 55-65 grams per day.   Do not drink alcohol. Limit the amount of caffeine you have, and drink enough fluid to keep your urine pale yellow.  This information is not intended to replace advice given to you by your health care provider. Make sure you discuss any questions you have with your health care provider.  Document Released: 11/06/2017 Document Revised: 11/06/2017 Document Reviewed: 11/06/2017  Elsevier Interactive Patient Education  2019 Elsevier Inc.

## 2018-08-25 NOTE — Progress Notes (Signed)
Paged [facility] MD paged back to see patient per Dr. Tamala Julian request.

## 2018-08-25 NOTE — Progress Notes (Signed)
[facility] Gastroenterology Progress Note  Heidi Dominguez 85 y.o. 04/22/1921   Subjective: Feels good. Denies abdominal pain.  Objective: Vital signs: Vitals:   08/25/18 0522 08/25/18 0940  BP: (!) 154/55   Pulse: 70   Resp: 19   Temp: 98.2 F (36.8 C)   SpO2: 95% 92%    Physical Exam: Gen: alert, no acute distress, elderly, well-nourished  HEENT: anicteric sclera  CV: RRR Chest: CTA B Abd: soft, nontender, nondistended, +BS Ext: no edema  Lab Results: Recent Labs    08/24/18 0602 08/25/18 0523  NA 142 138  K 3.4* 3.3*  CL 103 105  CO2 31 27  GLUCOSE 102* 122*  BUN 8 7*  CREATININE 0.79 0.65  CALCIUM 9.1 8.8*   Recent Labs    08/24/18 0602 08/25/18 0523  AST 102* 52*  ALT 146* 100*  ALKPHOS 381* 337*  BILITOT 0.8 0.7  PROT 5.9* 5.9*  ALBUMIN 2.7* 2.5*   Recent Labs    08/23/18 0623 08/24/18 0602  WBC 8.2 7.9  NEUTROABS 6.2  --   HGB 14.1 13.0  HCT 45.3 41.2  MCV 93.8 95.6  PLT 157 148*      Assessment/Plan: Resolving gallstone pancreatitis - AST/ALT normalizing. May have passed a stone. Would hold off on outpt EUS unless further issues develop. Ok to go home from GI standpoint. F/U with me in 3-4 weeks. Will sign off. Call if questions.   Heidi Dominguez 08/25/2018, 11:46 AM  Questions please call [phone] ID: Heidi Dominguez, female   DOB: March 11, 1921, 85 y.o.   MRN: [id number]

## 2018-08-25 NOTE — Progress Notes (Signed)
Patient tolerated breakfast well. Son at bedside verbalizing understanding of plan of care. Dr. Tamala Julian at bedside and spoke with daughter over phone to update on plan of care.

## 2018-08-26 ENCOUNTER — Other Ambulatory Visit: Payer: Self-pay

## 2018-08-26 ENCOUNTER — Encounter (HOSPITAL_COMMUNITY): Payer: Self-pay

## 2018-08-26 ENCOUNTER — Inpatient Hospital Stay (HOSPITAL_COMMUNITY)
Admission: EM | Admit: 2018-08-26 | Discharge: 2018-09-03 | DRG: 439 | Disposition: A | Discharge status: Skilled Nursing Facility | Payer: Medicare Other | Attending: Family Medicine | Admitting: Family Medicine

## 2018-08-26 DIAGNOSIS — Z9841 Cataract extraction status, right eye: Secondary | ICD-10-CM

## 2018-08-26 DIAGNOSIS — Z79899 Other long term (current) drug therapy: Secondary | ICD-10-CM

## 2018-08-26 DIAGNOSIS — N3941 Urge incontinence: Secondary | ICD-10-CM | POA: Diagnosis present

## 2018-08-26 DIAGNOSIS — R1010 Upper abdominal pain, unspecified: Secondary | ICD-10-CM | POA: Diagnosis not present

## 2018-08-26 DIAGNOSIS — K859 Acute pancreatitis without necrosis or infection, unspecified: Secondary | ICD-10-CM | POA: Diagnosis not present

## 2018-08-26 DIAGNOSIS — Z7982 Long term (current) use of aspirin: Secondary | ICD-10-CM

## 2018-08-26 DIAGNOSIS — M75101 Unspecified rotator cuff tear or rupture of right shoulder, not specified as traumatic: Secondary | ICD-10-CM | POA: Diagnosis present

## 2018-08-26 DIAGNOSIS — I959 Hypotension, unspecified: Secondary | ICD-10-CM | POA: Diagnosis not present

## 2018-08-26 DIAGNOSIS — N183 Chronic kidney disease, stage 3 unspecified: Secondary | ICD-10-CM | POA: Diagnosis present

## 2018-08-26 DIAGNOSIS — Z888 Allergy status to other drugs, medicaments and biological substances status: Secondary | ICD-10-CM

## 2018-08-26 DIAGNOSIS — R109 Unspecified abdominal pain: Secondary | ICD-10-CM

## 2018-08-26 DIAGNOSIS — D696 Thrombocytopenia, unspecified: Secondary | ICD-10-CM | POA: Diagnosis present

## 2018-08-26 DIAGNOSIS — K5900 Constipation, unspecified: Secondary | ICD-10-CM | POA: Diagnosis present

## 2018-08-26 DIAGNOSIS — Z9842 Cataract extraction status, left eye: Secondary | ICD-10-CM

## 2018-08-26 DIAGNOSIS — I493 Ventricular premature depolarization: Secondary | ICD-10-CM | POA: Diagnosis present

## 2018-08-26 DIAGNOSIS — Z8249 Family history of ischemic heart disease and other diseases of the circulatory system: Secondary | ICD-10-CM

## 2018-08-26 DIAGNOSIS — H9190 Unspecified hearing loss, unspecified ear: Secondary | ICD-10-CM | POA: Diagnosis present

## 2018-08-26 DIAGNOSIS — I10 Essential (primary) hypertension: Secondary | ICD-10-CM | POA: Diagnosis present

## 2018-08-26 DIAGNOSIS — Z882 Allergy status to sulfonamides status: Secondary | ICD-10-CM

## 2018-08-26 DIAGNOSIS — Z90710 Acquired absence of both cervix and uterus: Secondary | ICD-10-CM

## 2018-08-26 DIAGNOSIS — Z96653 Presence of artificial knee joint, bilateral: Secondary | ICD-10-CM | POA: Diagnosis present

## 2018-08-26 DIAGNOSIS — E1122 Type 2 diabetes mellitus with diabetic chronic kidney disease: Secondary | ICD-10-CM | POA: Diagnosis present

## 2018-08-26 DIAGNOSIS — K219 Gastro-esophageal reflux disease without esophagitis: Secondary | ICD-10-CM | POA: Diagnosis present

## 2018-08-26 DIAGNOSIS — R0902 Hypoxemia: Secondary | ICD-10-CM | POA: Diagnosis not present

## 2018-08-26 DIAGNOSIS — K449 Diaphragmatic hernia without obstruction or gangrene: Secondary | ICD-10-CM | POA: Diagnosis present

## 2018-08-26 DIAGNOSIS — K831 Obstruction of bile duct: Secondary | ICD-10-CM | POA: Diagnosis not present

## 2018-08-26 DIAGNOSIS — G8929 Other chronic pain: Secondary | ICD-10-CM | POA: Diagnosis present

## 2018-08-26 DIAGNOSIS — E119 Type 2 diabetes mellitus without complications: Secondary | ICD-10-CM

## 2018-08-26 DIAGNOSIS — Z961 Presence of intraocular lens: Secondary | ICD-10-CM | POA: Diagnosis present

## 2018-08-26 DIAGNOSIS — I5032 Chronic diastolic (congestive) heart failure: Secondary | ICD-10-CM | POA: Diagnosis present

## 2018-08-26 DIAGNOSIS — K851 Biliary acute pancreatitis without necrosis or infection: Principal | ICD-10-CM | POA: Diagnosis present

## 2018-08-26 DIAGNOSIS — Z7984 Long term (current) use of oral hypoglycemic drugs: Secondary | ICD-10-CM

## 2018-08-26 DIAGNOSIS — Z6828 Body mass index (BMI) 28.0-28.9, adult: Secondary | ICD-10-CM

## 2018-08-26 DIAGNOSIS — I130 Hypertensive heart and chronic kidney disease with heart failure and stage 1 through stage 4 chronic kidney disease, or unspecified chronic kidney disease: Secondary | ICD-10-CM | POA: Diagnosis present

## 2018-08-26 DIAGNOSIS — Z9049 Acquired absence of other specified parts of digestive tract: Secondary | ICD-10-CM

## 2018-08-26 DIAGNOSIS — E785 Hyperlipidemia, unspecified: Secondary | ICD-10-CM | POA: Diagnosis present

## 2018-08-26 DIAGNOSIS — Z791 Long term (current) use of non-steroidal anti-inflammatories (NSAID): Secondary | ICD-10-CM

## 2018-08-26 DIAGNOSIS — Z8672 Personal history of thrombophlebitis: Secondary | ICD-10-CM

## 2018-08-26 DIAGNOSIS — R1084 Generalized abdominal pain: Secondary | ICD-10-CM | POA: Diagnosis not present

## 2018-08-26 DIAGNOSIS — Z833 Family history of diabetes mellitus: Secondary | ICD-10-CM

## 2018-08-26 DIAGNOSIS — Z885 Allergy status to narcotic agent status: Secondary | ICD-10-CM

## 2018-08-26 DIAGNOSIS — R509 Fever, unspecified: Secondary | ICD-10-CM | POA: Diagnosis not present

## 2018-08-26 DIAGNOSIS — K861 Other chronic pancreatitis: Secondary | ICD-10-CM | POA: Diagnosis present

## 2018-08-26 DIAGNOSIS — S40219A Abrasion of unspecified shoulder, initial encounter: Secondary | ICD-10-CM

## 2018-08-26 DIAGNOSIS — R945 Abnormal results of liver function studies: Secondary | ICD-10-CM

## 2018-08-26 DIAGNOSIS — R7989 Other specified abnormal findings of blood chemistry: Secondary | ICD-10-CM | POA: Diagnosis present

## 2018-08-26 DIAGNOSIS — E663 Overweight: Secondary | ICD-10-CM | POA: Diagnosis present

## 2018-08-26 DIAGNOSIS — J449 Chronic obstructive pulmonary disease, unspecified: Secondary | ICD-10-CM | POA: Diagnosis present

## 2018-08-26 DIAGNOSIS — E7800 Pure hypercholesterolemia, unspecified: Secondary | ICD-10-CM | POA: Diagnosis present

## 2018-08-26 MED ORDER — FENTANYL CITRATE (PF) 100 MCG/2ML IJ SOLN
25.0000 ug | INTRAMUSCULAR | Status: DC | PRN
  Administered 2018-08-26 – 2018-08-31 (×9): 25 ug via INTRAVENOUS
  Filled 2018-08-26 (×9): qty 2

## 2018-08-26 NOTE — ED Provider Notes (Signed)
[facility] DEPT Provider Note: Heidi Spurling, MD, FACEP  CSN: [id number] MRN: [id number] ARRIVAL: 08/26/18 at 2157 ROOM: [place]  Abdominal Pain   HISTORY OF PRESENT ILLNESS  08/26/18 10:59 PM Heidi Dominguez is a 85 y.o. female who was admitted on the 10th of this month for abdominal pain and diagnosed with a mild case of gallstone pancreatitis.  She was discharged home yesterday with resolution of pain and improvement in her enzymes but no discharge summary was filed.  She returns with pain that began this afternoon.  The pain is located in the epigastrium and is like the pain she had previously.  She rates it as an 8 out of 10.  It is worse with movement or palpation.  She has had nausea but no vomiting.  She was given Zofran by EMS prior to arrival with partial relief.  She denies diarrhea but has had loose stools.  She has chronic back and arthritic pain which is baseline for her.   Past Medical History:  Diagnosis Date  . Arthritis    "qwhere" (07/04/2018)  . Chronic back pain    "all over" (07/04/2018)  . Chronic diastolic CHF (congestive heart failure) ([place])   . Complication of anesthesia    "I have a hard time waking up"  . Degenerative joint disease of shoulder region   . Family history of adverse reaction to anesthesia    "daughter has the shakes when she wakes up"  . GERD (gastroesophageal reflux disease)   . Heart murmur   . High cholesterol   . History of hiatal hernia   . HOH (hard of hearing)   . Hypertension   . Osteoarthritis   . Phlebitis    "BLE"  . Type II diabetes mellitus ([place])     Past Surgical History:  Procedure Laterality Date  . ABDOMINAL HYSTERECTOMY    . BACK SURGERY    . CATARACT EXTRACTION W/ INTRAOCULAR LENS  IMPLANT, BILATERAL Bilateral   . FIXATION KYPHOPLASTY    . JOINT REPLACEMENT    . LAPAROSCOPIC CHOLECYSTECTOMY SINGLE SITE WITH INTRAOPERATIVE CHOLANGIOGRAM N/A 04/11/2015   Procedure: LAPAROSCOPIC LYSIS OF  ADHESIONS, LAPAROSCOPIC CHOLECYSTECTOMY WITH INTRAOPERATIVE CHOLANGIOGRAM;  Surgeon: Michael Boston, MD;  Location: [facility] ORS;  Service: General;  Laterality: N/A;  . SHOULDER OPEN ROTATOR CUFF REPAIR Bilateral   . TOTAL KNEE ARTHROPLASTY Bilateral     Family History  Problem Relation Age of Onset  . Hypertension Mother   . Diabetes Mellitus I Mother   . Hypertension Father     Social History   Tobacco Use  . Smoking status: Never Smoker  . Smokeless tobacco: Never Used  Substance Use Topics  . Alcohol use: No    Comment: rare  . Drug use: No    Prior to Admission medications   Medication Sig Start Date End Date Taking? Authorizing Provider  albuterol (PROVENTIL HFA;VENTOLIN HFA) 108 (90 Base) MCG/ACT inhaler Inhale into the lungs every 6 (six) hours as needed for wheezing or shortness of breath.   Yes [provider]  amLODipine (NORVASC) 10 MG tablet Take 1 tablet (10 mg total) by mouth daily. Patient taking differently: Take 5 mg by mouth daily.  04/13/15  Yes Hongalgi, Lenis Dickinson, MD  aspirin 81 MG tablet Take 81 mg by mouth daily.   Yes [provider]  carboxymethylcellulose (REFRESH PLUS) 0.5 % SOLN Place 1 drop into both eyes daily as needed (dry eyes).   Yes [provider]  darifenacin (ENABLEX) 7.5 MG 24 hr tablet Take 7.5 mg by mouth daily.   Yes [provider]  famotidine (PEPCID) 20 MG tablet Take 20 mg by mouth at bedtime.   Yes [provider]  Ferrous Gluconate (IRON) 240 (27 FE) MG TABS Take 1 tablet by mouth daily.   Yes [provider]  folic acid (FOLVITE) 528 MCG tablet Take 400 mcg by mouth daily.   Yes [provider]  furosemide (LASIX) 40 MG tablet Take 0.5 tablets (20 mg total) by mouth daily. 08/25/18  Yes Fuller Plan A, MD  ibuprofen (ADVIL,MOTRIN) 200 MG tablet Take 200 mg by mouth every 8 (eight) hours as needed for moderate pain.   Yes [provider]  JANUVIA 50 MG tablet Take 50 mg  by mouth daily.  03/11/15  Yes [provider]  loratadine (CLARITIN) 10 MG tablet Take 10 mg by mouth daily.   Yes [provider]  Magnesium 250 MG TABS Take 1 tablet by mouth daily.   Yes [provider]  Omega-3 Fatty Acids (FISH OIL) 1000 MG CAPS Take 1 capsule by mouth daily.   Yes [provider]  pantoprazole (PROTONIX) 40 MG tablet Take 40 mg by mouth daily.   Yes [provider]  polyethylene glycol (MIRALAX / GLYCOLAX) packet Take 17 g by mouth daily as needed for mild constipation.   Yes [provider]  potassium chloride (K-DUR,KLOR-CON) 10 MEQ tablet Take 10 mEq by mouth daily.  03/22/15  Yes [provider]  vitamin B-12 (CYANOCOBALAMIN) 500 MCG tablet Take 1,000 mcg by mouth daily.    Yes [provider]  VITAMIN D, CHOLECALCIFEROL, PO Take 5,000 Units by mouth daily.   Yes [provider]    Allergies Catapres [clonidine hcl]; Covera-hs [verapamil hcl]; Other; Sulfa antibiotics; Sulfamethoxazole; Verapamil; and Sulfasalazine   REVIEW OF SYSTEMS  Negative except as noted here or in the History of Present Illness.   PHYSICAL EXAMINATION  Initial Vital Signs Blood pressure (!) 160/62, pulse 86, temperature 98.4 F (36.9 C), temperature source Oral, resp. rate 12, SpO2 93 %.  Examination General: Well-developed, well-nourished female in no acute distress; appearance consistent with age of record HENT: normocephalic; atraumatic Eyes: pupils equal, round and reactive to light; extraocular muscles intact Neck: supple Heart: regular rate and rhythm; systolic murmur loudest at left upper sternal border Lungs: clear to auscultation bilaterally Abdomen: soft; nondistended; epigastric tenderness; bowel sounds present Extremities: No deformity; full range of motion; +1 edema of lower legs Neurologic: Awake, alert and oriented; motor function intact in all extremities and symmetric; no facial  droop Skin: Warm and dry Psychiatric: Flat affect   RESULTS  Summary of this visit's results, reviewed by myself:   EKG Interpretation  Date/Time:    Ventricular Rate:    PR Interval:    QRS Duration:   QT Interval:    QTC Calculation:   R Axis:     Text Interpretation:        Laboratory Studies: Results for orders placed or performed during the hospital encounter of 08/26/18 (from the past 24 hour(s))  Comprehensive metabolic panel     Status: Abnormal   Collection Time: 08/26/18 11:10 PM  Result Value Ref Range   Sodium 140 135 - 145 mmol/L   Potassium 3.9 3.5 - 5.1 mmol/L   Chloride 101 98 - 111 mmol/L   CO2 27 22 - 32 mmol/L   Glucose, Bld 161 (H) 70 - 99  mg/dL   BUN 14 8 - 23 mg/dL   Creatinine, Ser 0.83 0.44 - 1.00 mg/dL   Calcium 9.5 8.9 - 10.3 mg/dL   Total Protein 7.3 6.5 - 8.1 g/dL   Albumin 3.3 (L) 3.5 - 5.0 g/dL   AST 591 (H) 15 - 41 U/L   ALT 297 (H) 0 - 44 U/L   Alkaline Phosphatase 583 (H) 38 - 126 U/L   Total Bilirubin 2.3 (H) 0.3 - 1.2 mg/dL   GFR calc non Af Amer 60 (L) >60 mL/min   GFR calc Af Amer >60 >60 mL/min   Anion gap 12 5 - 15  CBC with Differential/Platelet     Status: Abnormal   Collection Time: 08/26/18 11:10 PM  Result Value Ref Range   WBC 8.3 4.0 - 10.5 K/uL   RBC 4.69 3.87 - 5.11 MIL/uL   Hemoglobin 13.9 12.0 - 15.0 g/dL   HCT 45.7 36.0 - 46.0 %   MCV 97.4 80.0 - 100.0 fL   MCH 29.6 26.0 - 34.0 pg   MCHC 30.4 30.0 - 36.0 g/dL   RDW 13.8 11.5 - 15.5 %   Platelets 121 (L) 150 - 400 K/uL   nRBC 0.0 0.0 - 0.2 %   Neutrophils Relative % 94 %   Neutro Abs 7.8 (H) 1.7 - 7.7 K/uL   Lymphocytes Relative 4 %   Lymphs Abs 0.3 (L) 0.7 - 4.0 K/uL   Monocytes Relative 2 %   Monocytes Absolute 0.2 0.1 - 1.0 K/uL   Eosinophils Relative 0 %   Eosinophils Absolute 0.0 0.0 - 0.5 K/uL   Basophils Relative 0 %   Basophils Absolute 0.0 0.0 - 0.1 K/uL   Immature Granulocytes 0 %   Abs Immature Granulocytes 0.02 0.00 - 0.07 K/uL  Lipase,  blood     Status: Abnormal   Collection Time: 08/26/18 11:10 PM  Result Value Ref Range   Lipase 2,773 (H) 11 - 51 U/L   Imaging Studies: No results found.  ED COURSE and MDM  Nursing notes and initial vitals signs, including pulse oximetry, reviewed.  Vitals:   08/26/18 2208 08/26/18 2330 08/27/18 0130  BP: (!) 160/62 (!) 139/59 (!) 155/69  Pulse: 86 95 (!) 107  Resp: 12 20 (!) 24  Temp: 98.4 F (36.9 C)    TempSrc: Oral    SpO2: 93% 93% 92%   2:45 AM Dr. Hal Hope to see in ED.  He admitted her previously for the same complaint.  PROCEDURES    ED DIAGNOSES     ICD-10-CM   1. Acute recurrent pancreatitis K85.90   2. Biliary obstruction K83.1        Preethi Scantlebury, MD 08/27/18 0246

## 2018-08-26 NOTE — ED Notes (Signed)
Bed: WA21 Expected date:  Expected time:  Means of arrival:  Comments: 85yo F/ abd pain

## 2018-08-26 NOTE — ED Triage Notes (Signed)
Per EMS, patient coming from home with complaints of right sided flank pain/abdominal pain for the past 3 hours that comes and goes. She endorses nausea and was given 4 mg of Zofran by EMS but denies vomiting.    She was seen at [facility] on Sunday for gallstones.

## 2018-08-27 ENCOUNTER — Observation Stay (HOSPITAL_COMMUNITY): Payer: Medicare Other

## 2018-08-27 ENCOUNTER — Other Ambulatory Visit: Payer: Self-pay

## 2018-08-27 ENCOUNTER — Encounter (HOSPITAL_COMMUNITY): Payer: Self-pay | Admitting: Internal Medicine

## 2018-08-27 DIAGNOSIS — R109 Unspecified abdominal pain: Secondary | ICD-10-CM

## 2018-08-27 DIAGNOSIS — K859 Acute pancreatitis without necrosis or infection, unspecified: Secondary | ICD-10-CM | POA: Insufficient documentation

## 2018-08-27 DIAGNOSIS — R932 Abnormal findings on diagnostic imaging of liver and biliary tract: Secondary | ICD-10-CM | POA: Diagnosis not present

## 2018-08-27 DIAGNOSIS — R945 Abnormal results of liver function studies: Secondary | ICD-10-CM | POA: Diagnosis not present

## 2018-08-27 LAB — GLUCOSE, CAPILLARY
GLUCOSE-CAPILLARY: 102 mg/dL — AB (ref 70–99)
Glucose-Capillary: 172 mg/dL — ABNORMAL HIGH (ref 70–99)
Glucose-Capillary: 151 mg/dL — ABNORMAL HIGH (ref 70–99)
Glucose-Capillary: 96 mg/dL (ref 70–99)
Glucose-Capillary: 83 mg/dL (ref 70–99)

## 2018-08-27 LAB — CBC WITH DIFFERENTIAL/PLATELET
Abs Immature Granulocytes: 0.02 10*3/uL (ref 0.00–0.07)
Basophils Absolute: 0 10*3/uL (ref 0.0–0.1)
Basophils Relative: 0 %
Eosinophils Absolute: 0 10*3/uL (ref 0.0–0.5)
Eosinophils Relative: 0 %
HEMATOCRIT: 45.7 % (ref 36.0–46.0)
Hemoglobin: 13.9 g/dL (ref 12.0–15.0)
Immature Granulocytes: 0 %
Lymphocytes Relative: 4 %
Lymphs Abs: 0.3 10*3/uL — ABNORMAL LOW (ref 0.7–4.0)
MCH: 29.6 pg (ref 26.0–34.0)
MCHC: 30.4 g/dL (ref 30.0–36.0)
MCV: 97.4 fL (ref 80.0–100.0)
MONOS PCT: 2 %
Monocytes Absolute: 0.2 10*3/uL (ref 0.1–1.0)
NEUTROS PCT: 94 %
Neutro Abs: 7.8 10*3/uL — ABNORMAL HIGH (ref 1.7–7.7)
Platelets: 121 10*3/uL — ABNORMAL LOW (ref 150–400)
RBC: 4.69 MIL/uL (ref 3.87–5.11)
RDW: 13.8 % (ref 11.5–15.5)
WBC: 8.3 10*3/uL (ref 4.0–10.5)
nRBC: 0 % (ref 0.0–0.2)

## 2018-08-27 LAB — HEPATIC FUNCTION PANEL
ALT: 313 U/L — AB (ref 0–44)
AST: 406 U/L — ABNORMAL HIGH (ref 15–41)
Albumin: 2.9 g/dL — ABNORMAL LOW (ref 3.5–5.0)
Alkaline Phosphatase: 473 U/L — ABNORMAL HIGH (ref 38–126)
Bilirubin, Direct: 0.5 mg/dL — ABNORMAL HIGH (ref 0.0–0.2)
Indirect Bilirubin: 1.2 mg/dL — ABNORMAL HIGH (ref 0.3–0.9)
Total Bilirubin: 1.7 mg/dL — ABNORMAL HIGH (ref 0.3–1.2)
Total Protein: 6.1 g/dL — ABNORMAL LOW (ref 6.5–8.1)

## 2018-08-27 LAB — COMPREHENSIVE METABOLIC PANEL
ALT: 297 U/L — ABNORMAL HIGH (ref 0–44)
AST: 591 U/L — ABNORMAL HIGH (ref 15–41)
Albumin: 3.3 g/dL — ABNORMAL LOW (ref 3.5–5.0)
Alkaline Phosphatase: 583 U/L — ABNORMAL HIGH (ref 38–126)
Anion gap: 12 (ref 5–15)
BUN: 14 mg/dL (ref 8–23)
CALCIUM: 9.5 mg/dL (ref 8.9–10.3)
CO2: 27 mmol/L (ref 22–32)
Chloride: 101 mmol/L (ref 98–111)
Creatinine, Ser: 0.83 mg/dL (ref 0.44–1.00)
GFR calc Af Amer: >60 mL/min (ref >60)
GFR calc non Af Amer: 60 mL/min — ABNORMAL LOW (ref >60)
Glucose, Bld: 161 mg/dL — ABNORMAL HIGH (ref 70–99)
Potassium: 3.9 mmol/L (ref 3.5–5.1)
Sodium: 140 mmol/L (ref 135–145)
Total Bilirubin: 2.3 mg/dL — ABNORMAL HIGH (ref 0.3–1.2)
Total Protein: 7.3 g/dL (ref 6.5–8.1)

## 2018-08-27 LAB — BASIC METABOLIC PANEL
Anion gap: 9 (ref 5–15)
BUN: 16 mg/dL (ref 8–23)
CO2: 25 mmol/L (ref 22–32)
Calcium: 8.8 mg/dL — ABNORMAL LOW (ref 8.9–10.3)
Chloride: 102 mmol/L (ref 98–111)
Creatinine, Ser: 0.97 mg/dL (ref 0.44–1.00)
GFR calc Af Amer: 58 mL/min — ABNORMAL LOW (ref >60)
GFR, EST NON AFRICAN AMERICAN: 50 mL/min — AB (ref >60)
GLUCOSE: 159 mg/dL — AB (ref 70–99)
Potassium: 3.7 mmol/L (ref 3.5–5.1)
Sodium: 136 mmol/L (ref 135–145)

## 2018-08-27 LAB — LIPASE, BLOOD
Lipase: 2773 U/L — ABNORMAL HIGH (ref 11–51)
Lipase: 780 U/L — ABNORMAL HIGH (ref 11–51)
Lipase: 491 U/L — ABNORMAL HIGH (ref 11–51)

## 2018-08-27 LAB — TROPONIN I: Troponin I: 0.03 ng/mL (ref <0.03)

## 2018-08-27 MED ORDER — ONDANSETRON HCL 4 MG/2ML IJ SOLN: 4.0000 mg | Freq: Four times a day (QID) | INTRAMUSCULAR | Status: DC | PRN

## 2018-08-27 MED ORDER — ALBUTEROL SULFATE (2.5 MG/3ML) 0.083% IN NEBU
2.5000 mg | INHALATION_SOLUTION | RESPIRATORY_TRACT | Status: DC | PRN
  Administered 2018-08-30 – 2018-09-02 (×2): 2.5 mg via RESPIRATORY_TRACT
  Filled 2018-08-27 (×2): qty 3

## 2018-08-27 MED ORDER — SODIUM CHLORIDE 0.9 % IV SOLN: Freq: Once | INTRAVENOUS | Status: DC

## 2018-08-27 MED ORDER — ALBUTEROL SULFATE HFA 108 (90 BASE) MCG/ACT IN AERS: 2.0000 | INHALATION_SPRAY | RESPIRATORY_TRACT | Status: DC | PRN

## 2018-08-27 MED ORDER — INSULIN ASPART 100 UNIT/ML ~~LOC~~ SOLN
0.0000 [IU] | SUBCUTANEOUS | Status: DC
  Administered 2018-08-27 (×2): 2 [IU] via SUBCUTANEOUS
  Administered 2018-08-28 – 2018-08-29 (×4): 1 [IU] via SUBCUTANEOUS
  Administered 2018-08-29: 2 [IU] via SUBCUTANEOUS
  Administered 2018-08-29: 5 [IU] via SUBCUTANEOUS
  Administered 2018-08-30 (×2): 3 [IU] via SUBCUTANEOUS
  Administered 2018-08-30: 2 [IU] via SUBCUTANEOUS
  Administered 2018-08-30: 1 [IU] via SUBCUTANEOUS
  Administered 2018-08-31 (×4): 2 [IU] via SUBCUTANEOUS
  Administered 2018-08-31 – 2018-09-01 (×4): 1 [IU] via SUBCUTANEOUS
  Administered 2018-09-01 – 2018-09-02 (×4): 3 [IU] via SUBCUTANEOUS
  Administered 2018-09-02: 2 [IU] via SUBCUTANEOUS
  Administered 2018-09-02: 1 [IU] via SUBCUTANEOUS

## 2018-08-27 MED ORDER — ONDANSETRON HCL 4 MG PO TABS: 4.0000 mg | ORAL_TABLET | Freq: Four times a day (QID) | ORAL | Status: DC | PRN

## 2018-08-27 MED ORDER — LACTATED RINGERS IV SOLN
INTRAVENOUS | Status: AC
  Administered 2018-08-27 (×3): via INTRAVENOUS

## 2018-08-27 MED ORDER — GADOBUTROL 1 MMOL/ML IV SOLN
10.0000 mL | Freq: Once | INTRAVENOUS | Status: AC | PRN
  Administered 2018-08-27: 7 mL via INTRAVENOUS

## 2018-08-27 NOTE — Progress Notes (Addendum)
Patient admitted earlier this morning.  H&P reviewed.  Patient seen and examined.  Was recently discharged on January 12th for similar issue.  Patient states that the pain in her abdomen is currently 6 out of 10 in intensity.  Denies any nausea vomiting.  Her son is at the bedside  Vital signs are stable.  Lungs are clear to auscultation bilaterally S1-S2 is normal regular.  No S3-S4.  Systolic murmur appreciated over the precordium. Abdomen is soft.  Tenderness in the epigastrium and right upper quadrant.  No rebound rigidity or guarding.  Bowel sounds present.  No masses organomegaly She is alert and oriented x3.  No focal neurological deficits.  Patient here with acute pancreatitis thought to be biliary in origin.  She is however status post cholecystectomy previously.  Her LFTs were noted to be abnormal.  Continue to trend.  Check lipase level tomorrow morning.  Keep her n.p.o.  Gastroenterology has been consulted again.  They saw her 2 days ago.  Defer further management to them.  Her other medical issues include hypertension diabetes COPD and CHF.  These all appear to be stable.  Troponin of 0.03 noted.  Significance unclear.  She denies any chest pain.  EKG does not show any ischemic changes.  No further work-up.  We will continue to follow.  Bonnielee Haff 08/27/2018

## 2018-08-27 NOTE — Discharge Summary (Addendum)
Heidi Dominguez, is a 85 y.o. female  DOB [birth year]  MRN [id number].  Admission date:  08/23/2018  Admitting Physician  Rise Patience, MD  Discharge Date:  08/27/2018   Primary MD  Lajean Manes, MD  Recommendations for primary care physician for things to follow:      Admission Diagnosis  Acute biliary pancreatitis without infection or necrosis [K85.10] Abdominal pain [R10.9] Principal problem:   Abdominal pain Active problems:   Ischemic colitis   Essential hypertension   Diabetes mellitus type 2  Chronic kidney disease  Discharge Diagnosis  Acute biliary pancreatitis without infection or necrosis [K85.10] Abdominal pain [R10.9]    Principal Problem:   Gallstone pancreatitis Active Problems:   Essential hypertension   Hypokalemia    Abdominal pain   Diabetes mellitus type 2, controlled ([place])   Acute kidney injury superimposed on chronic kidney disease stage 3, GFR 30-59 ml/min (HCC)         Past Medical History:  Diagnosis Date  . Arthritis    "qwhere" (07/04/2018)  . Chronic back pain    "all over" (07/04/2018)  . Chronic diastolic CHF (congestive heart failure) ([place])   . Complication of anesthesia    "I have a hard time waking up"  . Degenerative joint disease of shoulder region   . Family history of adverse reaction to anesthesia    "daughter has the shakes when she wakes up"  . GERD (gastroesophageal reflux disease)   . Heart murmur   . High cholesterol   . History of hiatal hernia   . HOH (hard of hearing)   . Hypertension   . Osteoarthritis   . Phlebitis    "BLE"  . Type II diabetes mellitus ([place])     Past Surgical History:  Procedure Laterality Date  . ABDOMINAL HYSTERECTOMY    . BACK SURGERY    . CATARACT EXTRACTION W/ INTRAOCULAR LENS  IMPLANT, BILATERAL Bilateral   .  FIXATION KYPHOPLASTY    . JOINT REPLACEMENT    . LAPAROSCOPIC CHOLECYSTECTOMY SINGLE SITE WITH INTRAOPERATIVE CHOLANGIOGRAM N/A 04/11/2015   Procedure: LAPAROSCOPIC LYSIS OF ADHESIONS, LAPAROSCOPIC CHOLECYSTECTOMY WITH INTRAOPERATIVE CHOLANGIOGRAM;  Surgeon: Michael Boston, MD;  Location: [facility];  Service: General;  Laterality: N/A;  . SHOULDER OPEN ROTATOR CUFF REPAIR Bilateral   . TOTAL KNEE ARTHROPLASTY Bilateral        HPI  from the history and physical done on the day of admission:    Heidi Dominguez is a 85 y.o. female with history of diastolic CHF, hypertension, hyperlipidemia, diabetes mellitus type 2 who was recently admitted in November 2019 for nonspecific elevation of LFTs with imaging showing intra-and extrahepatic biliary dilatation eventually was diagnosed with E. coli bacteremia secondary UTI and discharged home was recently placed on Keflex for 7 days for UTI because of dysuria and last dose was taken yesterday has been experiencing abdominal pain mostly in the epigastric area and 3 days of constipation with no nausea or vomiting denies any fever chills.  Pain increased on  fatty food.  Pain is mostly in the epigastric area nonradiating.  Denies any chest pain.  Patient states he does take Tylenol every day.  ED Course: In the ER patient's labs revealed AST ALT of 460/260 with total bilirubin being normal and lipase was 3080.  CT of the abdomen and pelvis does not show anything acute in the abdomen except for known history of intra-and extrahepatic biliary dilatation.  Given the symptoms and marked elevated lipase patient has been admitted for further observation.   Hospital Course:   Gallstone pancreatitis causing abdominal pain, elevated lipase, and elevated liver enzyme: Resolved. Patient reports having midline abdominal pain.  Previous history of gallstone pancreatitis requiring cholecystectomy in 2016.  CT imaging showing a significantly dilated intra-and extrahepatic ducts.   Ischemic colitis thought to be less likely.  Patient was given IV fluids normal saline at 75 mL/h given heart history.   GI was consulted and obtain ultrasound which did not show definitive stone.  Lipase down from 3080->95, AP 532-> 381, AST 297-> 102, ALT 260->146.     Patient was placed on clears advance to a heart healthy diet.  Patient advised to follow-up with GI in outpatient setting.    Hypokalemia: Resolved.  Patient has potassium noted to be as low as 3.3 replaced with 40 mEq of potassium chloride during her hospital stay.  On day of discharge potassium was 3.9.  Diabetes mellitus type 2: Patient well-controlled on Januvia at home.  Januvia was held during her hospitalization and restarted at discharge.  Diastolic CHF:  Last echocardiogram from 04/09/2015 noted EF of 60 to 65% with Grade 1 diastolic dysfunction.  Patient appears to be euvolemic at this time.  Diuretics held due to need of hydration.  Acute kidney injury on chronic kidney disease stage II/III: Patient's creatinine noted to be elevated up to 0.99, and was down to 0.65 with IV fluids prior to discharge.  History of COPD: Patient denies any respiratory complaints at this time.  Recent UTI: Patient completed antibiotics on 1/9. Repeat urinalysis on admission negative.       Follow UP  Follow-up Information    Wilford Corner, MD. Schedule an appointment as soon as possible for a visit in 3 week(s).   Specialty:  Gastroenterology Why:  call and make appointment in 3-4 weeks. Contact information: [address] [place] [address] [phone]        Lajean Manes, MD Follow up.   Specialty:  Internal Medicine Contact information: [address] [place] [address] [phone]            Consults obtained: [facility] gastroenterology  Discharge Condition: stable  Diet and Activity recommendation: See Discharge Instructions below   Discharge Instructions    Diet -  low sodium heart healthy   Complete by:  As directed    Discharge instructions   Complete by:  As directed    Follow with Primary [place], Hal, MD within in 7 days.   Get CBC, CMP to have him go over medication changes made in the hospital.   Activity: As tolerated  fall precautions use walker/cane & assistance as needed  Diet: Heart Healthy     For Heart failure patients - Check your Weight same time everyday, if you gain over 2 pounds, or you develop in leg swelling, experience more shortness of breath or chest pain, call your Primary doctor immediately. Follow Cardiac Low Salt Diet and 1.5 lit/day fluid restriction.  On your  next visit with your primary care physician please Get Medicines reviewed and adjusted.  Please request your Lajean Manes, MD to go over all Hospital Tests and Procedure/Radiological results at the follow up, please get all Hospital records sent to your Prim MD by signing hospital release before you go home.  If you experience worsening of your admission symptoms, develop shortness of breath, life threatening emergency, suicidal or homicidal thoughts you must seek medical attention immediately by calling 911 or calling your MD immediately  if symptoms less severe.  You Must read complete instructions/literature along with all the possible adverse reactions/side effects for all the Medicines you take and that have been prescribed to you. Take any new Medicines after you have completely understood and accpet all the possible adverse reactions/side effects.   Do not drive, operate heavy machinery, perform activities at heights, swimming or participation in water activities or provide baby sitting services if your were admitted for syncope or siezures until you have seen by Primary MD or a Neurologist and advised to do so again.   Wear Seat belts while driving.   Please note  You were cared for by a hospitalist during your hospital stay. If you have any  questions about your discharge medications or the care you received while you were in the hospital after you are discharged, you can call the unit and asked to speak with the hospitalist on call if the hospitalist that took care of you is not available. Once you are discharged, your primary care physician will handle any further medical issues. Please note that NO REFILLS for any discharge medications will be authorized once you are discharged, as it is imperative that you return to your primary care physician (or establish a relationship with a primary care physician if you do not have one) for your aftercare needs so that they can reassess your need for medications and monitor your lab values.   Increase activity slowly   Complete by:  As directed         Discharge Medications     Allergies as of 08/25/2018      Reactions   Catapres [clonidine Hcl] Other (See Comments)   Unknown. Unknown.   Covera-hs [verapamil Hcl] Other (See Comments)   Unknown.   Other Other (See Comments)   Any type of narcotics Feels "loopy" Any type of narcotics Feels "loopy"   Sulfa Antibiotics Itching   Sulfamethoxazole Diarrhea   Verapamil Diarrhea, Other (See Comments)   Sulfasalazine Itching      Medication List    STOP taking these medications   acetaminophen 325 MG tablet Commonly known as:  TYLENOL   cephALEXin 500 MG capsule Commonly known as:  KEFLEX   mometasone-formoterol 200-5 MCG/ACT Aero Commonly known as:  DULERA   predniSONE 10 MG tablet Commonly known as:  DELTASONE   SYMBICORT 160-4.5 MCG/ACT inhaler Generic drug:  budesonide-formoterol     TAKE these medications   albuterol 108 (90 Base) MCG/ACT inhaler Commonly known as:  PROVENTIL HFA;VENTOLIN HFA Inhale into the lungs every 6 (six) hours as needed for wheezing or shortness of breath. What changed:  Another medication with the same name was removed. Continue taking this medication, and follow the directions you see  here.   amLODipine 10 MG tablet Commonly known as:  NORVASC Take 1 tablet (10 mg total) by mouth daily. What changed:  how much to take   aspirin 81 MG tablet Take 81 mg by mouth daily.   carboxymethylcellulose 0.5 %  Soln Commonly known as:  REFRESH PLUS Place 1 drop into both eyes daily as needed (dry eyes).   darifenacin 7.5 MG 24 hr tablet Commonly known as:  ENABLEX Take 7.5 mg by mouth daily.   famotidine 20 MG tablet Commonly known as:  PEPCID Take 20 mg by mouth at bedtime.   Fish Oil 1000 MG Caps Take 1 capsule by mouth daily.   folic acid 902 MCG tablet Commonly known as:  FOLVITE Take 400 mcg by mouth daily.   furosemide 40 MG tablet Commonly known as:  LASIX Take 0.5 tablets (20 mg total) by mouth daily. What changed:  how much to take   ibuprofen 200 MG tablet Commonly known as:  ADVIL,MOTRIN Take 200 mg by mouth every 8 (eight) hours as needed for moderate pain.   Iron 240 (27 Fe) MG Tabs Take 1 tablet by mouth daily.   JANUVIA 50 MG tablet Generic drug:  sitaGLIPtin Take 50 mg by mouth daily.   loratadine 10 MG tablet Commonly known as:  CLARITIN Take 10 mg by mouth daily.   Magnesium 250 MG Tabs Take 1 tablet by mouth daily.   pantoprazole 40 MG tablet Commonly known as:  PROTONIX Take 40 mg by mouth daily.   polyethylene glycol packet Commonly known as:  MIRALAX / GLYCOLAX Take 17 g by mouth daily as needed for mild constipation.   potassium chloride 10 MEQ tablet Commonly known as:  K-DUR,KLOR-CON Take 10 mEq by mouth daily.   vitamin B-12 500 MCG tablet Commonly known as:  CYANOCOBALAMIN Take 1,000 mcg by mouth daily.   VITAMIN D (CHOLECALCIFEROL) PO Take 5,000 Units by mouth daily.       Major procedures and Radiology Reports - PLEASE review detailed and final reports for all details, in brief -    Dg Abdomen 1 View  Result Date: 08/23/2018 CLINICAL DATA:  85 y/o F; generalized abdominal pain and constipation for 2  days. Nausea without vomiting. EXAM: ABDOMEN - 1 VIEW COMPARISON:  07/04/2018 CT abdomen and pelvis. FINDINGS: Normal bowel gas pattern. Right upper quadrant cholecystectomy clips. Chronic compression deformities of lower thoracic and upper lumbar spine, T12-L3. L3 kyphoplasty. IMPRESSION: Normal bowel gas pattern. Electronically Signed   By: Kristine Garbe M.D.   On: 08/23/2018 02:02   Ct Abdomen Pelvis W Contrast  Result Date: 08/23/2018 CLINICAL DATA:  85 y/o F; generalized abdominal pain and constipation for 2 days. Nausea and vomiting. EXAM: CT ABDOMEN AND PELVIS WITH CONTRAST TECHNIQUE: Multidetector CT imaging of the abdomen and pelvis was performed using the standard protocol following bolus administration of intravenous contrast. CONTRAST:  159mL OMNIPAQUE IOHEXOL 300 MG/ML  SOLN COMPARISON:  07/04/2018 CT abdomen and pelvis FINDINGS: Lower chest: Cardiomegaly. Large hiatal hernia. Coronary, aortic valvular, and mitral annular calcific atherosclerosis. Hepatobiliary: Cholecystectomy. Intra and extrahepatic biliary ductal dilatation with the common bile duct measuring up to 14 mm, stable from prior CT given differences in technique. No focal liver lesion identified. Pancreas: Unremarkable. No pancreatic ductal dilatation or surrounding inflammatory changes. Spleen: Normal in size without focal abnormality. Adrenals/Urinary Tract: Normal adrenal glands. Right kidney lower pole cyst measuring 23 mm. No urinary stone disease or hydronephrosis. Normal bladder. Stomach/Bowel: Stomach is within normal limits. Appendix not identified. No evidence of bowel wall thickening, distention, or inflammatory changes. Sigmoid diverticulosis without findings of acute diverticulitis. Vascular/Lymphatic: Aortic atherosclerosis. No enlarged abdominal or pelvic lymph nodes. Reproductive: Status post hysterectomy. No adnexal masses. Other: No abdominal wall hernia or abnormality. No abdominopelvic  ascites.  Musculoskeletal: Lumbar levocurvature with apex at T12-L1. stable T12-L3 compression deformities with L3 augmentation. Stable severe spondylosis with multilevel vacuum phenomenon and prominent facet arthropathy. IMPRESSION: 1. No acute process identified. 2. Large hiatal hernia. 3. Stable extensive intra and extrahepatic biliary ductal dilatation post cholecystectomy, probably compensatory. 4. Sigmoid diverticulosis without findings of acute diverticulitis. 5. Coronary and aortic Atherosclerosis (ICD10-I70.0). Stable cardiomegaly. 6. Stable T12-L3 compression deformities, L3 augmentation, and advanced lumbar spondylosis. Electronically Signed   By: Kristine Garbe M.D.   On: 08/23/2018 03:37   US Abdomen Limited Ruq  Result Date: 08/23/2018 CLINICAL DATA:  Elevated LFTs.  Cholecystectomy. EXAM: ULTRASOUND ABDOMEN LIMITED RIGHT UPPER QUADRANT COMPARISON:  CT 08/23/2018, 09/03/2017.  MRI 07/04/2018. FINDINGS: Gallbladder: Cholecystectomy. Common bile duct: Diameter: 15.5 mm.Similar findings noted on prior CT. Intrahepatic biliary ductal dilatation also again noted. Similar findings noted on prior CT. Liver: No focal hepatic abnormalities identified. Echotexture normal. Portal vein is patent on color Doppler imaging with normal direction of blood flow towards the liver. IMPRESSION: 1. Cholecystectomy. Dilated common bile duct and intrahepatic biliary ductal system again noted. Similar findings noted on prior studies. 2. No focal hepatic abnormality identified. Electronically Signed   By: Marcello Moores  Register   On: 08/23/2018 13:05    Micro Results     Recent Results (from the past 240 hour(s))  Culture, blood (routine x 2)     Status: None (Preliminary result)   Collection Time: 08/23/18  7:49 AM  Result Value Ref Range Status   Specimen Description BLOOD RIGHT HAND  Final   Special Requests   Final    BOTTLES DRAWN AEROBIC AND ANAEROBIC Blood Culture adequate volume   Culture   Final    NO  GROWTH 3 DAYS Performed at [facility], [address], [place], [place] [address]    Report Status PENDING  Incomplete  Culture, blood (routine x 2)     Status: None (Preliminary result)   Collection Time: 08/23/18  7:57 AM  Result Value Ref Range Status   Specimen Description BLOOD LEFT ANTECUBITAL  Final   Special Requests   Final    BOTTLES DRAWN AEROBIC AND ANAEROBIC Blood Culture adequate volume   Culture   Final    NO GROWTH 3 DAYS Performed at [facility], [place] [address], [place], [place] [address]    Report Status PENDING  Incomplete       Today   Subjective    Heidi Dominguez today has no complaints and is ready to be discharged home.  She reports having regular bowel movements no issues overnight.   Objective   Blood pressure (!) 173/66, pulse 80, temperature 98.2 F (36.8 C), temperature source Oral, resp. rate 16, height 5\' 3"  (1.6 m), weight 76.9 kg, SpO2 97 %.  No intake or output data in the 24 hours ending 08/27/18 0739  Exam  Constitutional: Elderly female in NAD, calm, comfortable Eyes: PERRL, lids and conjunctivae normal ENMT: Mucous membranes are moist. Posterior pharynx clear of any exudate or lesions.  Neck: normal, supple, no masses, no thyromegaly Respiratory: clear to auscultation bilaterally, no wheezing, no crackles. Normal respiratory effort. No accessory muscle use.  Cardiovascular: Regular rate and rhythm, no murmurs / rubs / gallops. No extremity edema. 2+ pedal pulses. No carotid bruits.  Abdomen: no tenderness, no masses palpated. No hepatosplenomegaly. Bowel sounds positive.  Musculoskeletal: no clubbing / cyanosis. No joint deformity upper and lower extremities. Good ROM, no contractures. Normal muscle tone.  Skin: no rashes, lesions, ulcers. No induration Neurologic: CN 2-12 grossly intact. Sensation intact, DTR normal. Strength 5/5 in all 4.  Psychiatric: Normal judgment and insight. Alert and oriented x 3. Normal mood.     Data Review   CBC w Diff:  Lab Results  Component Value Date   WBC 8.3 08/26/2018   HGB 13.9 08/26/2018   HCT 45.7 08/26/2018   PLT 121 (L) 08/26/2018   LYMPHOPCT 4 08/26/2018   MONOPCT 2 08/26/2018   EOSPCT 0 08/26/2018   BASOPCT 0 08/26/2018    CMP:  Lab Results  Component Value Date   NA 140 08/26/2018   K 3.9 08/26/2018   CL 101 08/26/2018   CO2 27 08/26/2018   BUN 14 08/26/2018   CREATININE 0.83 08/26/2018   PROT 7.3 08/26/2018   ALBUMIN 3.3 (L) 08/26/2018   BILITOT 2.3 (H) 08/26/2018   ALKPHOS 583 (H) 08/26/2018   AST 591 (H) 08/26/2018   ALT 297 (H) 08/26/2018  .   Total Time in preparing paper work, data evaluation and todays exam - 35 minutes  Norval Morton M.D on 08/27/2018 at 7:39 AM  [facility]   Office  [phone]

## 2018-08-27 NOTE — Progress Notes (Signed)
[facility] Gastroenterology Progress Note  Heidi Dominguez 85 y.o. 09-22-1920  CC: Abdominal pain, pancreatitis   Subjective: This is a 85 year old patient was recently discharged from the hospital after being treated for possible gallstone pancreatitis.  She noticed worsening epigastric pain and came back to the hospital.  Was found to have elevated lipase and  mild elevation in LFTs.  Epigastric pain is resolved now but she is complaining of right lower quadrant abdominal pain.  Denies nausea vomiting.  Denies fever.  ROS : Negative for chest pain and shortness of breath.   Objective: Vital signs in last 24 hours: Vitals:   08/27/18 0600 08/27/18 1252  BP: (!) 131/53 (!) 128/57  Pulse: 93 83  Resp: 18 14  Temp: 99.4 F (37.4 C) 99.5 F (37.5 C)  SpO2: 95% 97%    Physical Exam:  General.  Alert/oriented x3.  Not in acute distress Abd: .  Mild right lower quadrant discomfort, soft, nondistended, bowel sounds present. Psych.  Mood and affect normal Extremity.  No edema  Lab Results: Recent Labs    08/26/18 2310 08/27/18 0744  NA 140 136  K 3.9 3.7  CL 101 102  CO2 27 25  GLUCOSE 161* 159*  BUN 14 16  CREATININE 0.83 0.97  CALCIUM 9.5 8.8*   Recent Labs    08/26/18 2310 08/27/18 0744  AST 591* 406*  ALT 297* 313*  ALKPHOS 583* 473*  BILITOT 2.3* 1.7*  PROT 7.3 6.1*  ALBUMIN 3.3* 2.9*   Recent Labs    08/26/18 2310  WBC 8.3  NEUTROABS 7.8*  HGB 13.9  HCT 45.7  MCV 97.4  PLT 121*   No results for input(s): LABPROT, INR in the last 72 hours.    Assessment/Plan: -Acute/persistent pancreatitis.  Patient with epigastric pain and elevated lipase at 2773.   She is status post cholecystectomy.  Normal triglyceride earlier this month. - elevated LFTs.( AST 406, ALT 313, Alk phos 473 and T bili 1.7 ) negative hepatitis panel, AMA, ANA, ASMA in November 2019.  Normal ceruloplasmin.  She had mild elevated ferritin in November(761)  but normal iron  saturation. -Abdominal pain.  Recommendations. ------------------------- -MRI MRCP for further evaluation. - supportive care.  Recheck labs in the morning. -Okay to have full liquid diet after MRI.  GI will follow  Otis Brace MD, FACP 08/27/2018, 2:18 PM  Contact #  [phone]

## 2018-08-27 NOTE — H&P (Signed)
History and Physical    Heidi Dominguez KWI:[id number] DOB: 05-Feb-1921 DOA: 08/26/2018  PCP: Lajean Manes, MD  Patient coming from: Home.  Chief Complaint: Abdominal pain.  HPI: Heidi Dominguez is a 85 y.o. female with history of CHF, hypertension, hyperlipidemia, COPD who has been admitted recently for recurrent pancreatitis suspected to be secondary to gallstone discharge just recently 3 days ago at that time LFTs have significantly decreased comes back again with complaints of severe sudden onset of abdominal pain last evening around 7 PM.  Pain is in epigastric area radiating to the back with nausea vomiting.  Denies any diarrhea.  Denies any chest pain or shortness of breath fever chills.  ED Course: In the ER patient was given pain leave medications IV fluids and labs done shows total bilirubin of 2.3 which was normal 3 days ago and AST/ALT is fine 91 and 297 which was 3 days ago 52/100.  Lipase is 2773 which was just 95 few days ago.  Patient has been admitted for acute pancreatitis likely from gallstones.  Patient is afebrile and not hypotensive.  Review of Systems: As per HPI, rest all negative.   Past Medical History:  Diagnosis Date  . Arthritis    "qwhere" (07/04/2018)  . Chronic back pain    "all over" (07/04/2018)  . Chronic diastolic CHF (congestive heart failure) ([place])   . Complication of anesthesia    "I have a hard time waking up"  . Degenerative joint disease of shoulder region   . Family history of adverse reaction to anesthesia    "daughter has the shakes when she wakes up"  . GERD (gastroesophageal reflux disease)   . Heart murmur   . High cholesterol   . History of hiatal hernia   . HOH (hard of hearing)   . Hypertension   . Osteoarthritis   . Phlebitis    "BLE"  . Type II diabetes mellitus ([place])     Past Surgical History:  Procedure Laterality Date  . ABDOMINAL HYSTERECTOMY    . BACK SURGERY    . CATARACT EXTRACTION W/ INTRAOCULAR LENS  IMPLANT,  BILATERAL Bilateral   . FIXATION KYPHOPLASTY    . JOINT REPLACEMENT    . LAPAROSCOPIC CHOLECYSTECTOMY SINGLE SITE WITH INTRAOPERATIVE CHOLANGIOGRAM N/A 04/11/2015   Procedure: LAPAROSCOPIC LYSIS OF ADHESIONS, LAPAROSCOPIC CHOLECYSTECTOMY WITH INTRAOPERATIVE CHOLANGIOGRAM;  Surgeon: Michael Boston, MD;  Location: [facility];  Service: General;  Laterality: N/A;  . SHOULDER OPEN ROTATOR CUFF REPAIR Bilateral   . TOTAL KNEE ARTHROPLASTY Bilateral      reports that she has never smoked. She has never used smokeless tobacco. She reports that she does not drink alcohol or use drugs.  Allergies  Allergen Reactions  . Catapres [Clonidine Hcl] Other (See Comments)    Unknown. Unknown.  Lorrin Goodell [Verapamil Hcl] Other (See Comments)    Unknown.  . Other Other (See Comments)    Any type of narcotics Feels "loopy" Any type of narcotics Feels "loopy"  . Sulfa Antibiotics Itching  . Sulfamethoxazole Diarrhea  . Verapamil Diarrhea and Other (See Comments)  . Sulfasalazine Itching    Family History  Problem Relation Age of Onset  . Hypertension Mother   . Diabetes Mellitus I Mother   . Hypertension Father     Prior to Admission medications   Medication Sig Start Date End Date Taking? Authorizing Provider  albuterol (PROVENTIL HFA;VENTOLIN HFA) 108 (90 Base) MCG/ACT inhaler Inhale into the lungs every 6 (six) hours as needed  for wheezing or shortness of breath.   Yes [provider]  amLODipine (NORVASC) 10 MG tablet Take 1 tablet (10 mg total) by mouth daily. Patient taking differently: Take 5 mg by mouth daily.  04/13/15  Yes Hongalgi, Lenis Dickinson, MD  aspirin 81 MG tablet Take 81 mg by mouth daily.   Yes [provider]  carboxymethylcellulose (REFRESH PLUS) 0.5 % SOLN Place 1 drop into both eyes daily as needed (dry eyes).   Yes [provider]  darifenacin (ENABLEX) 7.5 MG 24 hr tablet Take 7.5 mg by mouth daily.   Yes [provider]  famotidine (PEPCID) 20  MG tablet Take 20 mg by mouth at bedtime.   Yes [provider]  Ferrous Gluconate (IRON) 240 (27 FE) MG TABS Take 1 tablet by mouth daily.   Yes [provider]  folic acid (FOLVITE) 062 MCG tablet Take 400 mcg by mouth daily.   Yes [provider]  furosemide (LASIX) 40 MG tablet Take 0.5 tablets (20 mg total) by mouth daily. 08/25/18  Yes Fuller Plan A, MD  ibuprofen (ADVIL,MOTRIN) 200 MG tablet Take 200 mg by mouth every 8 (eight) hours as needed for moderate pain.   Yes [provider]  JANUVIA 50 MG tablet Take 50 mg by mouth daily.  03/11/15  Yes [provider]  loratadine (CLARITIN) 10 MG tablet Take 10 mg by mouth daily.   Yes [provider]  Magnesium 250 MG TABS Take 1 tablet by mouth daily.   Yes [provider]  Omega-3 Fatty Acids (FISH OIL) 1000 MG CAPS Take 1 capsule by mouth daily.   Yes [provider]  pantoprazole (PROTONIX) 40 MG tablet Take 40 mg by mouth daily.   Yes [provider]  polyethylene glycol (MIRALAX / GLYCOLAX) packet Take 17 g by mouth daily as needed for mild constipation.   Yes [provider]  potassium chloride (K-DUR,KLOR-CON) 10 MEQ tablet Take 10 mEq by mouth daily.  03/22/15  Yes [provider]  vitamin B-12 (CYANOCOBALAMIN) 500 MCG tablet Take 1,000 mcg by mouth daily.    Yes [provider]  VITAMIN D, CHOLECALCIFEROL, PO Take 5,000 Units by mouth daily.   Yes [provider]    Physical Exam: Vitals:   08/26/18 2330 08/27/18 0130 08/27/18 0300 08/27/18 0330  BP: (!) 139/59 (!) 155/69 136/63 (!) 114/59  Pulse: 95 (!) 107 (!) 104 (!) 103  Resp: 20 (!) 24 (!) 26 (!) 25  Temp:      TempSrc:      SpO2: 93% 92% 95% 95%      Constitutional: Moderately built and nourished. Vitals:   08/26/18 2330 08/27/18 0130 08/27/18 0300 08/27/18 0330  BP: (!) 139/59 (!) 155/69 136/63 (!) 114/59  Pulse: 95 (!) 107 (!) 104 (!) 103  Resp: 20  (!) 24 (!) 26 (!) 25  Temp:      TempSrc:      SpO2: 93% 92% 95% 95%   Eyes: Anicteric no pallor. ENMT: No discharge from the ears eyes nose or mouth. Neck: No mass felt.  No neck rigidity. Respiratory: No rhonchi or crepitations. Cardiovascular: S1-S2 heard. Abdomen: Soft mild epigastric tenderness no guarding or rigidity. Musculoskeletal: No edema.  No joint effusion. Skin: No rash. Neurologic: Alert awake oriented to time place and person.  Moves all extremities. Psychiatric: Appears normal.  Normal affect.   Labs on Admission: I have personally reviewed following labs and imaging studies  CBC:  Recent Labs  Lab 08/23/18 0047 08/23/18 0623 08/24/18 0602 08/26/18 2310  WBC 11.4* 8.2 7.9 8.3  NEUTROABS  --  6.2  --  7.8*  HGB 14.1 14.1 13.0 13.9  HCT 43.8 45.3 41.2 45.7  MCV 95.0 93.8 95.6 97.4  PLT 160 157 148* 102*   Basic Metabolic Panel: Recent Labs  Lab 08/23/18 0047 08/23/18 0623 08/24/18 0602 08/25/18 0523 08/26/18 2310  NA 133* 138 142 138 140  K 3.5 3.8 3.4* 3.3* 3.9  CL 97* 100 103 105 101  CO2 27 29 31 27 27   GLUCOSE 258* 141* 102* 122* 161*  BUN 13 10 8  7* 14  CREATININE 0.99 0.86 0.79 0.65 0.83  CALCIUM 9.7 9.9 9.1 8.8* 9.5   GFR: Estimated Creatinine Clearance: 39.8 mL/min (by C-G formula based on SCr of 0.83 mg/dL). Liver Function Tests: Recent Labs  Lab 08/23/18 0047 08/23/18 0623 08/24/18 0602 08/25/18 0523 08/26/18 2310  AST 460* 297* 102* 52* 591*  ALT 260* 260* 146* 100* 297*  ALKPHOS 527* 532* 381* 337* 583*  BILITOT 1.1 0.9 0.8 0.7 2.3*  PROT 7.3 7.3 5.9* 5.9* 7.3  ALBUMIN 3.1* 3.2* 2.7* 2.5* 3.3*   Recent Labs  Lab 08/23/18 0047 08/24/18 0602 08/26/18 2310  LIPASE 3,080* 95* 2,773*   No results for input(s): AMMONIA in the last 168 hours. Coagulation Profile: No results for input(s): INR, PROTIME in the last 168 hours. Cardiac Enzymes: Recent Labs  Lab 08/23/18 0623  TROPONINI <0.03   BNP (last 3 results) No  results for input(s): PROBNP in the last 8760 hours. HbA1C: No results for input(s): HGBA1C in the last 72 hours. CBG: Recent Labs  Lab 08/24/18 1556 08/24/18 2011 08/25/18 0017 08/25/18 0428 08/25/18 0802  GLUCAP 170* 156* 168* 120* 110*   Lipid Profile: Recent Labs    08/24/18 0602  CHOL 171  HDL 50  LDLCALC 103*  TRIG 89  CHOLHDL 3.4   Thyroid Function Tests: No results for input(s): TSH, T4TOTAL, FREET4, T3FREE, THYROIDAB in the last 72 hours. Anemia Panel: No results for input(s): VITAMINB12, FOLATE, FERRITIN, TIBC, IRON, RETICCTPCT in the last 72 hours. Urine analysis:    Component Value Date/Time   COLORURINE STRAW (A) 08/23/2018 0355   APPEARANCEUR CLEAR 08/23/2018 0355   LABSPEC 1.010 08/23/2018 0355   PHURINE 6.0 08/23/2018 0355   GLUCOSEU NEGATIVE 08/23/2018 0355   HGBUR NEGATIVE 08/23/2018 0355   BILIRUBINUR NEGATIVE 08/23/2018 0355   KETONESUR NEGATIVE 08/23/2018 0355   PROTEINUR NEGATIVE 08/23/2018 0355   UROBILINOGEN 1.0 04/07/2015 2312   NITRITE NEGATIVE 08/23/2018 0355   LEUKOCYTESUR NEGATIVE 08/23/2018 0355   Sepsis Labs: @LABRCNTIP (procalcitonin:4,lacticidven:4) ) Recent Results (from the past 240 hour(s))  Culture, blood (routine x 2)     Status: None (Preliminary result)   Collection Time: 08/23/18  7:49 AM  Result Value Ref Range Status   Specimen Description BLOOD RIGHT HAND  Final   Special Requests   Final    BOTTLES DRAWN AEROBIC AND ANAEROBIC Blood Culture adequate volume   Culture   Final    NO GROWTH 3 DAYS Performed at [facility], [place] [address], [place], [place] [address]    Report Status PENDING  Incomplete  Culture, blood (routine x 2)     Status: None (Preliminary result)   Collection Time: 08/23/18  7:57 AM  Result Value Ref Range Status   Specimen Description BLOOD LEFT ANTECUBITAL  Final   Special Requests   Final    BOTTLES  DRAWN AEROBIC AND ANAEROBIC Blood Culture adequate volume   Culture   Final    NO  GROWTH 3 DAYS Performed at [facility], [place] [address] [place], [place] [address]    Report Status PENDING  Incomplete     Radiological Exams on Admission: No results found.    Assessment/Plan Principal Problem:   Abdominal pain Active Problems:   Essential hypertension   Diabetes mellitus type 2, controlled (HCC)   Chronic diastolic CHF (congestive heart failure) (HCC)   Elevated LFTs   CKD (chronic kidney disease) stage 3, GFR 30-59 ml/min (HCC)    1. Abdominal pain likely from recurrent pancreatitis suspected to be secondary to gallstones -patient n.p.o. IV fluids pain relief medication repeat labs consult GI in the morning. 2. Hypertension we will keep patient on PRN IV hydralazine.  Patient is n.p.o. 3. Diabetes mellitus type 2 on sliding scale coverage. 4. History of COPD not wheezing at this time. 5. History of CHF appears compensated and patient is receiving fluids for acute pancreatitis.   DVT prophylaxis: SCDs. Code Status: Full code. Family Communication: Patient's daughter. Disposition Plan: Home. Consults called: None. Admission status: Observation.   Rise Patience MD [facility] Pager [phone].  If 7PM-7AM, please contact night-coverage [email or web address] Password [email or web address]  08/27/2018, 4:06 AM

## 2018-08-27 NOTE — Progress Notes (Addendum)
CRITICAL VALUE ALERT  Critical Value:  Trop 0.03  Date & Time Notied:  08/27/2018 at [place]  Provider Notified: Dr. Maryland Pink notified via text page.  Orders Received/Actions taken: Dr. Maryland Pink on unit to see patient this morning.  No new orders.

## 2018-08-28 DIAGNOSIS — Z741 Need for assistance with personal care: Secondary | ICD-10-CM | POA: Diagnosis not present

## 2018-08-28 DIAGNOSIS — I130 Hypertensive heart and chronic kidney disease with heart failure and stage 1 through stage 4 chronic kidney disease, or unspecified chronic kidney disease: Secondary | ICD-10-CM | POA: Diagnosis present

## 2018-08-28 DIAGNOSIS — M25519 Pain in unspecified shoulder: Secondary | ICD-10-CM | POA: Diagnosis not present

## 2018-08-28 DIAGNOSIS — Z888 Allergy status to other drugs, medicaments and biological substances status: Secondary | ICD-10-CM | POA: Diagnosis not present

## 2018-08-28 DIAGNOSIS — K831 Obstruction of bile duct: Secondary | ICD-10-CM | POA: Diagnosis not present

## 2018-08-28 DIAGNOSIS — E7800 Pure hypercholesterolemia, unspecified: Secondary | ICD-10-CM | POA: Diagnosis present

## 2018-08-28 DIAGNOSIS — N3941 Urge incontinence: Secondary | ICD-10-CM | POA: Diagnosis present

## 2018-08-28 DIAGNOSIS — E119 Type 2 diabetes mellitus without complications: Secondary | ICD-10-CM | POA: Diagnosis not present

## 2018-08-28 DIAGNOSIS — N3281 Overactive bladder: Secondary | ICD-10-CM | POA: Diagnosis not present

## 2018-08-28 DIAGNOSIS — N183 Chronic kidney disease, stage 3 (moderate): Secondary | ICD-10-CM | POA: Diagnosis present

## 2018-08-28 DIAGNOSIS — R1312 Dysphagia, oropharyngeal phase: Secondary | ICD-10-CM | POA: Diagnosis not present

## 2018-08-28 DIAGNOSIS — D529 Folate deficiency anemia, unspecified: Secondary | ICD-10-CM | POA: Diagnosis not present

## 2018-08-28 DIAGNOSIS — J449 Chronic obstructive pulmonary disease, unspecified: Secondary | ICD-10-CM | POA: Diagnosis present

## 2018-08-28 DIAGNOSIS — H9190 Unspecified hearing loss, unspecified ear: Secondary | ICD-10-CM | POA: Diagnosis present

## 2018-08-28 DIAGNOSIS — Z7401 Bed confinement status: Secondary | ICD-10-CM | POA: Diagnosis not present

## 2018-08-28 DIAGNOSIS — Z9841 Cataract extraction status, right eye: Secondary | ICD-10-CM | POA: Diagnosis not present

## 2018-08-28 DIAGNOSIS — M6281 Muscle weakness (generalized): Secondary | ICD-10-CM | POA: Diagnosis not present

## 2018-08-28 DIAGNOSIS — Z9842 Cataract extraction status, left eye: Secondary | ICD-10-CM | POA: Diagnosis not present

## 2018-08-28 DIAGNOSIS — K8050 Calculus of bile duct without cholangitis or cholecystitis without obstruction: Secondary | ICD-10-CM | POA: Diagnosis not present

## 2018-08-28 DIAGNOSIS — R0902 Hypoxemia: Secondary | ICD-10-CM | POA: Diagnosis not present

## 2018-08-28 DIAGNOSIS — E663 Overweight: Secondary | ICD-10-CM | POA: Diagnosis present

## 2018-08-28 DIAGNOSIS — G8929 Other chronic pain: Secondary | ICD-10-CM | POA: Diagnosis present

## 2018-08-28 DIAGNOSIS — R52 Pain, unspecified: Secondary | ICD-10-CM | POA: Diagnosis not present

## 2018-08-28 DIAGNOSIS — E1122 Type 2 diabetes mellitus with diabetic chronic kidney disease: Secondary | ICD-10-CM | POA: Diagnosis present

## 2018-08-28 DIAGNOSIS — M25511 Pain in right shoulder: Secondary | ICD-10-CM | POA: Diagnosis not present

## 2018-08-28 DIAGNOSIS — K219 Gastro-esophageal reflux disease without esophagitis: Secondary | ICD-10-CM | POA: Diagnosis present

## 2018-08-28 DIAGNOSIS — I5032 Chronic diastolic (congestive) heart failure: Secondary | ICD-10-CM | POA: Diagnosis present

## 2018-08-28 DIAGNOSIS — Z96653 Presence of artificial knee joint, bilateral: Secondary | ICD-10-CM | POA: Diagnosis present

## 2018-08-28 DIAGNOSIS — R109 Unspecified abdominal pain: Secondary | ICD-10-CM | POA: Diagnosis not present

## 2018-08-28 DIAGNOSIS — K859 Acute pancreatitis without necrosis or infection, unspecified: Secondary | ICD-10-CM | POA: Diagnosis present

## 2018-08-28 DIAGNOSIS — Z6828 Body mass index (BMI) 28.0-28.9, adult: Secondary | ICD-10-CM | POA: Diagnosis not present

## 2018-08-28 DIAGNOSIS — Z9689 Presence of other specified functional implants: Secondary | ICD-10-CM | POA: Diagnosis not present

## 2018-08-28 DIAGNOSIS — E785 Hyperlipidemia, unspecified: Secondary | ICD-10-CM | POA: Diagnosis present

## 2018-08-28 DIAGNOSIS — K861 Other chronic pancreatitis: Secondary | ICD-10-CM | POA: Diagnosis present

## 2018-08-28 DIAGNOSIS — M2550 Pain in unspecified joint: Secondary | ICD-10-CM | POA: Diagnosis not present

## 2018-08-28 DIAGNOSIS — Z885 Allergy status to narcotic agent status: Secondary | ICD-10-CM | POA: Diagnosis not present

## 2018-08-28 DIAGNOSIS — T8484XA Pain due to internal orthopedic prosthetic devices, implants and grafts, initial encounter: Secondary | ICD-10-CM | POA: Diagnosis not present

## 2018-08-28 DIAGNOSIS — R2689 Other abnormalities of gait and mobility: Secondary | ICD-10-CM | POA: Diagnosis not present

## 2018-08-28 DIAGNOSIS — K851 Biliary acute pancreatitis without necrosis or infection: Secondary | ICD-10-CM | POA: Diagnosis present

## 2018-08-28 DIAGNOSIS — D696 Thrombocytopenia, unspecified: Secondary | ICD-10-CM | POA: Diagnosis present

## 2018-08-28 DIAGNOSIS — K5900 Constipation, unspecified: Secondary | ICD-10-CM | POA: Diagnosis present

## 2018-08-28 DIAGNOSIS — Z96611 Presence of right artificial shoulder joint: Secondary | ICD-10-CM | POA: Diagnosis not present

## 2018-08-28 DIAGNOSIS — Z471 Aftercare following joint replacement surgery: Secondary | ICD-10-CM | POA: Diagnosis not present

## 2018-08-28 DIAGNOSIS — Z961 Presence of intraocular lens: Secondary | ICD-10-CM | POA: Diagnosis present

## 2018-08-28 DIAGNOSIS — E559 Vitamin D deficiency, unspecified: Secondary | ICD-10-CM | POA: Diagnosis not present

## 2018-08-28 DIAGNOSIS — Z882 Allergy status to sulfonamides status: Secondary | ICD-10-CM | POA: Diagnosis not present

## 2018-08-28 DIAGNOSIS — D519 Vitamin B12 deficiency anemia, unspecified: Secondary | ICD-10-CM | POA: Diagnosis not present

## 2018-08-28 LAB — COMPREHENSIVE METABOLIC PANEL
ALT: 195 U/L — AB (ref 0–44)
AST: 150 U/L — AB (ref 15–41)
Albumin: 2.4 g/dL — ABNORMAL LOW (ref 3.5–5.0)
Alkaline Phosphatase: 373 U/L — ABNORMAL HIGH (ref 38–126)
Anion gap: 12 (ref 5–15)
BUN: 12 mg/dL (ref 8–23)
CO2: 25 mmol/L (ref 22–32)
CREATININE: 0.71 mg/dL (ref 0.44–1.00)
Calcium: 9.1 mg/dL (ref 8.9–10.3)
Chloride: 103 mmol/L (ref 98–111)
Glucose, Bld: 89 mg/dL (ref 70–99)
Potassium: 3.6 mmol/L (ref 3.5–5.1)
Sodium: 140 mmol/L (ref 135–145)
Total Bilirubin: 1.3 mg/dL — ABNORMAL HIGH (ref 0.3–1.2)
Total Protein: 5.9 g/dL — ABNORMAL LOW (ref 6.5–8.1)

## 2018-08-28 LAB — CBC
HCT: 41.3 % (ref 36.0–46.0)
Hemoglobin: 12.6 g/dL (ref 12.0–15.0)
MCH: 29.8 pg (ref 26.0–34.0)
MCHC: 30.5 g/dL (ref 30.0–36.0)
MCV: 97.6 fL (ref 80.0–100.0)
Platelets: 103 10*3/uL — ABNORMAL LOW (ref 150–400)
RBC: 4.23 MIL/uL (ref 3.87–5.11)
RDW: 13.9 % (ref 11.5–15.5)
WBC: 12.6 10*3/uL — ABNORMAL HIGH (ref 4.0–10.5)
nRBC: 0 % (ref 0.0–0.2)

## 2018-08-28 LAB — CULTURE, BLOOD (ROUTINE X 2)
CULTURE: NO GROWTH
Culture: NO GROWTH
Special Requests: ADEQUATE
Special Requests: ADEQUATE

## 2018-08-28 LAB — GLUCOSE, CAPILLARY
GLUCOSE-CAPILLARY: 80 mg/dL (ref 70–99)
Glucose-Capillary: 128 mg/dL — ABNORMAL HIGH (ref 70–99)
Glucose-Capillary: 130 mg/dL — ABNORMAL HIGH (ref 70–99)
Glucose-Capillary: 82 mg/dL (ref 70–99)
Glucose-Capillary: 83 mg/dL (ref 70–99)

## 2018-08-28 LAB — LIPASE, BLOOD: Lipase: 215 U/L — ABNORMAL HIGH (ref 11–51)

## 2018-08-28 MED ORDER — LACTATED RINGERS IV SOLN
INTRAVENOUS | Status: AC
  Administered 2018-08-28 – 2018-08-29 (×2): via INTRAVENOUS

## 2018-08-28 MED ORDER — SODIUM CHLORIDE 0.9 % IV BOLUS
500.0000 mL | Freq: Once | INTRAVENOUS | Status: AC
  Administered 2018-08-28: 500 mL via INTRAVENOUS

## 2018-08-28 MED ORDER — AMLODIPINE BESYLATE 5 MG PO TABS
5.0000 mg | ORAL_TABLET | Freq: Every day | ORAL | Status: DC
  Administered 2018-08-28 – 2018-09-03 (×7): 5 mg via ORAL
  Filled 2018-08-28 (×7): qty 1

## 2018-08-28 MED ORDER — CARBOXYMETHYLCELLULOSE SODIUM 0.5 % OP SOLN: 1.0000 [drp] | Freq: Every day | OPHTHALMIC | Status: DC | PRN

## 2018-08-28 MED ORDER — DARIFENACIN HYDROBROMIDE ER 7.5 MG PO TB24
7.5000 mg | ORAL_TABLET | Freq: Every day | ORAL | Status: DC
  Administered 2018-08-28 – 2018-09-03 (×7): 7.5 mg via ORAL
  Filled 2018-08-28 (×7): qty 1

## 2018-08-28 MED ORDER — PANTOPRAZOLE SODIUM 40 MG PO TBEC
40.0000 mg | DELAYED_RELEASE_TABLET | Freq: Every day | ORAL | Status: DC
  Administered 2018-08-28 – 2018-09-03 (×7): 40 mg via ORAL
  Filled 2018-08-28 (×7): qty 1

## 2018-08-28 MED ORDER — IBUPROFEN 200 MG PO TABS
400.0000 mg | ORAL_TABLET | Freq: Once | ORAL | Status: AC
  Administered 2018-08-28: 400 mg via ORAL
  Filled 2018-08-28: qty 2

## 2018-08-28 MED ORDER — POLYVINYL ALCOHOL 1.4 % OP SOLN
1.0000 [drp] | Freq: Every day | OPHTHALMIC | Status: DC | PRN
  Administered 2018-08-28 – 2018-09-01 (×6): 1 [drp] via OPHTHALMIC
  Filled 2018-08-28: qty 15

## 2018-08-28 MED ORDER — POLYETHYLENE GLYCOL 3350 17 G PO PACK
17.0000 g | PACK | Freq: Every day | ORAL | Status: DC
  Administered 2018-08-28 – 2018-09-02 (×6): 17 g via ORAL
  Filled 2018-08-28 (×7): qty 1

## 2018-08-28 NOTE — Care Management Obs Status (Signed)
[place] [place] NOTIFICATION   Patient Details  Name: Heidi Dominguez MRN: [id number] Date of Birth: October 04, 1920   Medicare Observation Status Notification Given:  Yes    Purcell Mouton, RN 08/28/2018, 2:12 PM

## 2018-08-28 NOTE — Progress Notes (Signed)
[facility] PROGRESS NOTE  Heidi Dominguez WJX:[id number] DOB: [birth year] DOA: 08/26/2018 PCP: Merlene Laughter, MD   Narrative: 85 year old female HTN HLD DM TY 2 -Recent admission 06/2018 for nonspecific elevation LFTs with intra-and extrahepatic biliary dilatation-at that time she was placed on Keflex for what appeared to be E. coli bacteremia and took it for 7 days -She had a lap chole 03/2015 after gallstone pancreatitis  Return to the emergency room 1/12 with gallstone pancreatitis-AST ALT 460/260 bilirubin normal lipase 3080-GI consulted and an ultrasound was done did not show stones lipase went down to 95 alkaline phosphate to 381 AST to 102 and ALT to 146 and she was advised to heart healthy from clear liquid diet and discharged on 1/14 she returned with severe sudden onset abdominal pain in the epigastric region radiating to the back with nausea vomiting without diarrhea Lipase was found to be 2773 up from 95   A & Plan ? Gallstone/sludge pancreatitis MRI has been done patient is on liquid diet-I had a discussion in person with Dr. Georgiann Cocker who thinks that he see sludge on the MRI and patient will need probably an EUS at some point later on this hospital stay-I appreciate GI input into her care HTN- resumed amlodipine 10 mg daily Mild thrombocytopenia, mild leukocytosis -hold Lovenox-monitor labs as slight drop probably hemodilution related--monitor trends for leukocytosis Diabetes mellitus type 2 on orals -can resume Januvia 50 daily on discharge Chronic diastolic heart failure EF preserved 03/2015 -holding Lasix 20 mg at this time Check magnesium and labs a.m. Stage II CKD -stable at this time improved compared to prior-recheck labs in a.m. Reflux resume on Pepcid on discharge Constipation continue MiraLAX Urge incontinence continue Enablex 7.5 daily Mild overweight BMI 28  SCD, inpatient, pending further GI input and management  Heran Campau, MD  [facility] Direct contact: [phone] --Via amion app OR  --[email or web address]; password [email or web address]  7PM-7AM contact night coverage as above 08/28/2018, 12:59 PM  LOS: 0 days   Consultants:  Gastroenterology  Interval history/Subjective: Awake alert in no distress and in good spirits She states 6/10 pain in her abdomen no rebound no guarding She has been managing to tolerate ginger ale and fluids without you No Fever no chills  Objective:  Vitals:  Vitals:   08/27/18 2029 08/28/18 0429  BP: (!) 171/67 (!) 144/65  Pulse: 86 (!) 45  Resp: 18 17  Temp: 99.1 F (37.3 C) 98.9 F (37.2 C)  SpO2: 99% 99%    Exam:  Awake alert pleasant no distress EOMI NCAT mild icterus no pallor Abdomen slightly tender upper quadrant no rebound Chest is clinically clear without added sound S1-S2 normal no murmur rub Telemetry shows some bigeminy with PVCs    I have personally reviewed the following:  DATA   Labs:  Alk phos 373 lipase down from 4 91-2 15 total protein is 5.9 total bilirubin 1.3 down from 1.7 AST/ALT four 6/313 down to 150/195  Hemoglobin 12, WBC 12.6 up from 8.3, platelet 103  Imaging studies: MRCP 1/14 Uncomplicated acute pancreatitis.  Status post cholecystectomy. Stable mild intrahepatic and moderate extrahepatic ductal dilatation. No choledocholithiasis is seen.  Small bilateral pleural effusions.   Decision to obtain old records:  y  Review and summation of old records:  y  Scheduled Meds: . amLODipine  5 mg Oral Daily  . darifenacin  7.5 mg Oral Daily  . insulin aspart  0-9 Units Subcutaneous Q4H  . pantoprazole  40 mg Oral Daily  . polyethylene  glycol  17 g Oral Daily   Continuous Infusions:  Principal Problem:   Abdominal pain Active Problems:   Essential hypertension   Diabetes mellitus type 2, controlled (HCC)   Chronic diastolic CHF (congestive heart failure) (HCC)   Elevated LFTs   CKD (chronic kidney disease) stage 3, GFR 30-59  ml/min (HCC)   LOS: 0 days

## 2018-08-28 NOTE — Progress Notes (Signed)
[facility] Gastroenterology Progress Note  Heidi Dominguez 85 y.o. 1921-05-11  CC: Abdominal pain, pancreatitis   Subjective: She is feeling better today.  Abdominal pain is improving.  No bowel movement since admission.  Denies nausea vomiting.   ROS : Negative for chest pain and shortness of breath.   Objective: Vital signs in last 24 hours: Vitals:   08/27/18 2029 08/28/18 0429  BP: (!) 171/67 (!) 144/65  Pulse: 86 (!) 45  Resp: 18 17  Temp: 99.1 F (37.3 C) 98.9 F (37.2 C)  SpO2: 99% 99%    Physical Exam:  General.  Alert/oriented x3.  Not in acute distress Abd: .  Mild right upper quadrant discomfort on palpation but no definite tenderness,, soft, nondistended, bowel sounds present. Psych.  Mood and affect normal Extremity.  No edema  Lab Results: Recent Labs    08/27/18 0744 08/28/18 0520  NA 136 140  K 3.7 3.6  CL 102 103  CO2 25 25  GLUCOSE 159* 89  BUN 16 12  CREATININE 0.97 0.71  CALCIUM 8.8* 9.1   Recent Labs    08/27/18 0744 08/28/18 0520  AST 406* 150*  ALT 313* 195*  ALKPHOS 473* 373*  BILITOT 1.7* 1.3*  PROT 6.1* 5.9*  ALBUMIN 2.9* 2.4*   Recent Labs    08/26/18 2310 08/28/18 0520  WBC 8.3 12.6*  NEUTROABS 7.8*  --   HGB 13.9 12.6  HCT 45.7 41.3  MCV 97.4 97.6  PLT 121* 103*   No results for input(s): LABPROT, INR in the last 72 hours.    Assessment/Plan: -Acute/persistent and stable.  Patient with epigastric pain and elevated lipase at 2773.   She is status post cholecystectomy.  Normal triglyceride earlier this month. - elevated LFTs.( AST 406, ALT 313, Alk phos 473 and T bili 1.7 ) negative hepatitis panel, AMA, ANA, ASMA in November 2019.  Normal ceruloplasmin.  She had mild elevated ferritin in November(761)  but normal iron saturation. -Abdominal pain.  Improving -constipation   Recommendations. ------------------------- -MRI MRCP showed uncomplicated acute pancreatitis and stable intra-and extrahepatic ductal  dilatation without choledocholithiasis. -Patient discussed with patient's niece Dr. Oletta Lamas.  - Discussed with Dr. Paulita Fujita and Dr. Watt Climes.    Patient with recurrent episodes of pancreatitis.  Even if EUS is negative and if she keeps having recurrent episodes of pancreatitis, she will need ERCP for sphincterotomy.   ADDENDUM  3:53 pm   Need for ERCP discussed with the patient.  She is not sure yet.   called patient's daughter.  No answer. She is feeling better with a clear liquid diet.  I will advance diet to full liquid.  Recheck lab work in the morning. GI will follow  Otis Brace MD, [place] 08/28/2018, 12:36 PM  Contact #  [phone]

## 2018-08-29 ENCOUNTER — Encounter: Payer: Self-pay | Admitting: Podiatry

## 2018-08-29 ENCOUNTER — Inpatient Hospital Stay (HOSPITAL_COMMUNITY): Payer: Medicare Other

## 2018-08-29 LAB — CBC WITH DIFFERENTIAL/PLATELET
ABS IMMATURE GRANULOCYTES: 0.08 10*3/uL — AB (ref 0.00–0.07)
Basophils Absolute: 0 10*3/uL (ref 0.0–0.1)
Basophils Relative: 0 %
Eosinophils Absolute: 0.2 10*3/uL (ref 0.0–0.5)
Eosinophils Relative: 2 %
HCT: 39.1 % (ref 36.0–46.0)
HEMOGLOBIN: 12 g/dL (ref 12.0–15.0)
Immature Granulocytes: 1 %
LYMPHS PCT: 10 %
Lymphs Abs: 1.1 10*3/uL (ref 0.7–4.0)
MCH: 29.8 pg (ref 26.0–34.0)
MCHC: 30.7 g/dL (ref 30.0–36.0)
MCV: 97 fL (ref 80.0–100.0)
Monocytes Absolute: 1.4 10*3/uL — ABNORMAL HIGH (ref 0.1–1.0)
Monocytes Relative: 13 %
Neutro Abs: 8.4 10*3/uL — ABNORMAL HIGH (ref 1.7–7.7)
Neutrophils Relative %: 74 %
Platelets: 103 10*3/uL — ABNORMAL LOW (ref 150–400)
RBC: 4.03 MIL/uL (ref 3.87–5.11)
RDW: 13.6 % (ref 11.5–15.5)
WBC: 11.3 10*3/uL — ABNORMAL HIGH (ref 4.0–10.5)
nRBC: 0 % (ref 0.0–0.2)

## 2018-08-29 LAB — COMPREHENSIVE METABOLIC PANEL
ALT: 117 U/L — ABNORMAL HIGH (ref 0–44)
AST: 55 U/L — ABNORMAL HIGH (ref 15–41)
Albumin: 2.2 g/dL — ABNORMAL LOW (ref 3.5–5.0)
Alkaline Phosphatase: 297 U/L — ABNORMAL HIGH (ref 38–126)
Anion gap: 7 (ref 5–15)
BUN: 12 mg/dL (ref 8–23)
CHLORIDE: 101 mmol/L (ref 98–111)
CO2: 28 mmol/L (ref 22–32)
Calcium: 8.8 mg/dL — ABNORMAL LOW (ref 8.9–10.3)
Creatinine, Ser: 0.63 mg/dL (ref 0.44–1.00)
GFR calc Af Amer: >60 mL/min (ref >60)
GFR calc non Af Amer: >60 mL/min (ref >60)
Glucose, Bld: 100 mg/dL — ABNORMAL HIGH (ref 70–99)
Potassium: 3.5 mmol/L (ref 3.5–5.1)
Sodium: 136 mmol/L (ref 135–145)
Total Bilirubin: 1.2 mg/dL (ref 0.3–1.2)
Total Protein: 5.5 g/dL — ABNORMAL LOW (ref 6.5–8.1)

## 2018-08-29 LAB — GLUCOSE, CAPILLARY
GLUCOSE-CAPILLARY: 98 mg/dL (ref 70–99)
Glucose-Capillary: 148 mg/dL — ABNORMAL HIGH (ref 70–99)
Glucose-Capillary: 95 mg/dL (ref 70–99)
Glucose-Capillary: 87 mg/dL (ref 70–99)
Glucose-Capillary: 265 mg/dL — ABNORMAL HIGH (ref 70–99)
Glucose-Capillary: 180 mg/dL — ABNORMAL HIGH (ref 70–99)
Glucose-Capillary: 139 mg/dL — ABNORMAL HIGH (ref 70–99)

## 2018-08-29 MED ORDER — LACTATED RINGERS IV SOLN
INTRAVENOUS | Status: DC
  Administered 2018-08-29 – 2018-08-31 (×3): via INTRAVENOUS

## 2018-08-29 MED ORDER — PIPERACILLIN-TAZOBACTAM 3.375 G IVPB
3.3750 g | Freq: Three times a day (TID) | INTRAVENOUS | Status: DC
  Administered 2018-08-29 – 2018-08-30 (×3): 3.375 g via INTRAVENOUS
  Filled 2018-08-29 (×3): qty 50

## 2018-08-29 MED ORDER — IBUPROFEN 200 MG PO TABS
400.0000 mg | ORAL_TABLET | Freq: Once | ORAL | Status: AC
  Administered 2018-08-29: 400 mg via ORAL
  Filled 2018-08-29: qty 2

## 2018-08-29 NOTE — Progress Notes (Signed)
[facility] PROGRESS NOTE  Heidi Dominguez [id number]:[id number] DOB: 14-Apr-1921 DOA: 08/26/2018 PCP: Merlene Laughter, MD   Narrative: 85 year old female HTN HLD DM TY 2 -Recent admission 06/2018 for nonspecific elevation LFTs with intra-and extrahepatic biliary dilatation-at that time she was placed on Keflex for what appeared to be E. coli bacteremia and took it for 7 days -She had a lap chole 03/2015 after gallstone pancreatitis  Return to the emergency room 1/12 with gallstone pancreatitis-AST ALT 460/260 bilirubin normal lipase 3080-GI consulted and an ultrasound was done did not show stones lipase went down to 95 alkaline phosphate to 381 AST to 102 and ALT to 146 and she was advised to heart healthy from clear liquid diet and discharged on 1/14 she returned with severe sudden onset abdominal pain in the epigastric region radiating to the back with nausea vomiting without diarrhea Lipase was found to be 2773 up from 95   A & Plan ? Gallstone/sludge pancreatitis MRI as below Fever overnight 101 so GI started IV ZOsyn for presumed GB stone pancreatitis Further dipso per them--ERCP am 11/17? HTN- resumed amlodipine 10 mg daily Mild thrombocytopenia, mild leukocytosis Stable to improved at this time Diabetes mellitus type 2 on orals -can resume Januvia 50 daily on discharge Chronic diastolic heart failure EF preserved 03/2015 -holding Lasix 20 mg at this time Check magnesium and labs a.m. Stage II CKD -stable at this time improved compared to prior-recheck labs in a.m. R shoudler pain-  Likely rotator cuff related--asking OT to see Reflux resume on Pepcid on discharge Constipation continue MiraLAX Urge incontinence continue Enablex 7.5 daily Mild overweight BMI 28  SCD, inpatient, pending further GI input and management  Telesforo Brosnahan, MD  [facility] Direct contact: [phone] --Via amion app OR  --[email or web address]; password [email or web address]  7PM-7AM contact night coverage as  above 08/29/2018, 1:15 PM  LOS: 1 day   Consultants:  Gastroenterology  Interval history/Subjective:  Main issue R shoudler pain and R arm swelling States started when she came to hosptial No belly pain No cp   Objective:  Vitals:  Vitals:   08/28/18 2024 08/29/18 0415  BP: (!) 150/54 (!) 150/62  Pulse: 75 65  Resp: (!) 24 20  Temp: (!) 101 F (38.3 C) 98 F (36.7 C)  SpO2: 92% 95%    Exam:  Awake alert pleasant no distress EOMI NCAT mild icterus no pallor Tender Bicipital groove--limite dROm to shoudler internal rotationj-deferrre speed and yergasson test Abdomen slightly tender upper quadrant no rebound Chest is clinically clear without added sound S1-S2 normal no murmur rub Telemetry shows some bigeminy with PVCs    I have personally reviewed the following:  DATA   Labs:  Alk phos 373--297 lipase down from 4 91-2 15 total protein is 5.9   total bilirubin 1.2 down from 1.7  AST/ALT 46/313 --150/195-->55/117  Hemoglobin 12, WBC 12.6 up from 8.3, platelet 103  Imaging studies: MRCP 1/14 Uncomplicated acute pancreatitis.  Status post cholecystectomy. Stable mild intrahepatic and moderate extrahepatic ductal dilatation. No choledocholithiasis is seen.  Small bilateral pleural effusions.   Decision to obtain old records:  y  Review and summation of old records:  y  Scheduled Meds: . amLODipine  5 mg Oral Daily  . darifenacin  7.5 mg Oral Daily  . insulin aspart  0-9 Units Subcutaneous Q4H  . pantoprazole  40 mg Oral Daily  . polyethylene glycol  17 g Oral Daily   Continuous Infusions: . piperacillin-tazobactam (ZOSYN)  IV 3.375 g (08/29/18 1309)  Principal Problem:   Abdominal pain Active Problems:   Essential hypertension   Diabetes mellitus type 2, controlled (HCC)   Chronic diastolic CHF (congestive heart failure) (HCC)   Elevated LFTs   CKD (chronic kidney disease) stage 3, GFR 30-59 ml/min (HCC)   Acute pancreatitis    LOS: 1 day

## 2018-08-29 NOTE — Progress Notes (Signed)
This RN got patient up to [facility] with much difficulty and assistance from the person that helps her at home.  Called for second staff member to assist back to bed, legs very weak, both shoulders hurting with limited movement of arms and hands.  Patient states her hands feel numb (not a new complaint). Max 2 person assist just to stand and pivot at this point which is significantly different than her baseline per friend.

## 2018-08-29 NOTE — Progress Notes (Signed)
[facility] Gastroenterology Progress Note  Heidi Dominguez 85 y.o. June 16, 1921  CC: Abdominal pain, pancreatitis   Subjective: Patient had fever with temperature of 101 F yesterday night.  Complaining of right upper quadrant pressure sensation as well as right shoulder pain.  Shoulder x-ray negative.  She denies any urinary symptoms.  Had bowel movement this morning.   ROS : Negative for chest pain and shortness of breath.   Objective: Vital signs in last 24 hours: Vitals:   08/28/18 2024 08/29/18 0415  BP: (!) 150/54 (!) 150/62  Pulse: 75 65  Resp: (!) 24 20  Temp: (!) 101 F (38.3 C) 98 F (36.7 C)  SpO2: 92% 95%    Physical Exam:  General.  Alert/oriented x3.  Not in acute distress Abd: .  Mild right upper quadrant discomfort on palpation but no definite tenderness,, soft, nondistended, bowel sounds present. Psych.  Mood and affect normal Extremity.  No edema  Lab Results: Recent Labs    08/28/18 0520 08/29/18 0441  NA 140 136  K 3.6 3.5  CL 103 101  CO2 25 28  GLUCOSE 89 100*  BUN 12 12  CREATININE 0.71 0.63  CALCIUM 9.1 8.8*   Recent Labs    08/28/18 0520 08/29/18 0441  AST 150* 55*  ALT 195* 117*  ALKPHOS 373* 297*  BILITOT 1.3* 1.2  PROT 5.9* 5.5*  ALBUMIN 2.4* 2.2*   Recent Labs    08/26/18 2310 08/28/18 0520 08/29/18 0441  WBC 8.3 12.6* 11.3*  NEUTROABS 7.8*  --  8.4*  HGB 13.9 12.6 12.0  HCT 45.7 41.3 39.1  MCV 97.4 97.6 97.0  PLT 121* 103* 103*   No results for input(s): LABPROT, INR in the last 72 hours.    Assessment/Plan: -Acute/persistent and stable.  Patient with epigastric pain and elevated lipase at 2773.   She is status post cholecystectomy.  Normal triglyceride earlier this month. - elevated LFTs.( AST 406, ALT 313, Alk phos 473 and T bili 1.7 ) negative hepatitis panel, AMA, ANA, ASMA in November 2019.  Normal ceruloplasmin.  She had mild elevated ferritin in November(761)  but normal iron saturation. -Abdominal pain.   Improving -constipation   Recommendations. ------------------------- -LFTs improving but patient had a fever yesterday night.  Start antibiotics.   -  ERCP reading material from up-to-date was given to patient and patient's family yesterday .  Discussed with patient's daughter Beryle Bagsby) over the phone and procedure ERCP discussed along with risks and complications.  Option to observe for a few days and discharge if she feels better also discussed.  She prefers ERCP. -Plan for ERCP tomorrow morning at 730.  Otis Brace MD, [place] 08/29/2018, 8:33 AM  Contact #  [phone]

## 2018-08-29 NOTE — Progress Notes (Signed)
Subjective: Heidi Dominguez presents today with diabetes, diabetic neuropathy.  She is seen routinely for  Painful mycotic toenails b/l and callus of left foot. She voices no new problems on today's visit.  Lajean Manes, MD is her PCP.  Allergies  Allergen Reactions  . Catapres [Clonidine Hcl] Other (See Comments)    Unknown. Unknown.  Lorrin Goodell [Verapamil Hcl] Other (See Comments)    Unknown.  . Other Other (See Comments)    Any type of narcotics Feels "loopy" Any type of narcotics Feels "loopy"  . Sulfa Antibiotics Itching  . Sulfamethoxazole Diarrhea  . Verapamil Diarrhea and Other (See Comments)  . Sulfasalazine Itching    Objective:  Vascular Examination: Capillary refill time <3 seconds x 10 digits Dorsalis pedis pulses 1/4 b/l Posterior tibial pulses absent b/l No digital hair x 10 digits Skin temperature warm to cool b/l +Trace edema BLE +Varicosities BLE  Dermatological Examination: Skin thin, shiny and atrophic b/l  Toenails 1-5 b/l discolored, thick, dystrophic with subungual debris and pain with palpation to nailbeds due to thickness of nails.  Hyperkeratotic lesion noted submetatarsal head 1 left foot. No erythema, no edema, no drainage, no flocculence.  Musculoskeletal: Muscle strength 5/5 to all LE muscle groups Hallux abductovalgus with bunion deformity bilaterally Hammertoe deformity 2 through 5 bilaterally Pes planus foot deformity bilaterally  Neurological: Sensation diminished with 10 gram monofilament. Vibratory sensation diminished  Assessment: 1. Painful onychomycosis toenails 1-5 b/l 2. Callus submetatarsal head 1 the left foot 3. NIDDM with Peripheral arterial disease 4. Diabetic neuropathy  Plan: 1. Continue diabetic foot care principles. Literature dispensed. 2. Toenails 1-5 b/l were debrided in length and girth without iatrogenic bleeding. 3. Hyperkeratotic lesion submetatarsal head 1 left foot pared with sterile chisel blade  and gently smoothed with burr.  She may continue her silicone bunion shield for protection daily. 4. Patient to continue soft, supportive shoe gear 5. Patient to report any pedal injuries to medical professional  6. Follow up 9 weeks. Patient/POA to call should there be a concern in the interim.

## 2018-08-29 NOTE — Plan of Care (Signed)
Patient tolerating full liquids, denies pain, no nausea or vomiting.  Patient signed consent for ERCP, verbalizes understanding that she is nothing by mouth after midnight for procedure.  Multiple family members at bedside.

## 2018-08-29 NOTE — Progress Notes (Signed)
Pharmacy Antibiotic Note  Heidi Dominguez is a 85 y.o. female admitted on 08/26/2018 with gallstone panceatitis.  Pharmacy has been consulted for zosyn dosing for intra-abdominal infection  Plan: Zosyn 3.375g IV Q8H infused over 4hrs. Pharmacy will sign off and follow peripherally  Height: 5\' 4"  (162.6 cm) Weight: 169 lb 15.6 oz (77.1 kg) IBW/kg (Calculated) : 54.7  Temp (24hrs), Avg:99.3 F (37.4 C), Min:98 F (36.7 C), Max:101 F (38.3 C)  Recent Labs  Lab 08/23/18 0623 08/24/18 0602 08/25/18 0523 08/26/18 2310 08/27/18 0744 08/28/18 0520 08/29/18 0441  WBC 8.2 7.9  --  8.3  --  12.6* 11.3*  CREATININE 0.86 0.79 0.65 0.83 0.97 0.71 0.63  LATICACIDVEN 1.4  --   --   --   --   --   --     Estimated Creatinine Clearance: 42.3 mL/min (by C-G formula based on SCr of 0.63 mg/dL).    Allergies  Allergen Reactions  . Catapres [Clonidine Hcl] Other (See Comments)    Unknown. Unknown.  Lorrin Goodell [Verapamil Hcl] Other (See Comments)    Unknown.  . Other Other (See Comments)    Any type of narcotics Feels "loopy" Any type of narcotics Feels "loopy"  . Sulfa Antibiotics Itching  . Sulfamethoxazole Diarrhea  . Verapamil Diarrhea and Other (See Comments)  . Sulfasalazine Itching     Thank you for allowing pharmacy to be a part of this patient's care.  Dolly Rias RPh 08/29/2018, 11:27 AM Pager [phone]

## 2018-08-30 ENCOUNTER — Encounter (HOSPITAL_COMMUNITY)
Admission: EM | Disposition: A | Discharge status: Skilled Nursing Facility | Payer: Self-pay | Source: Home / Self Care | Attending: Family Medicine

## 2018-08-30 ENCOUNTER — Inpatient Hospital Stay (HOSPITAL_COMMUNITY): Payer: Medicare Other

## 2018-08-30 ENCOUNTER — Inpatient Hospital Stay (HOSPITAL_COMMUNITY): Payer: Medicare Other | Admitting: Certified Registered"

## 2018-08-30 ENCOUNTER — Encounter (HOSPITAL_COMMUNITY): Payer: Self-pay | Admitting: *Deleted

## 2018-08-30 HISTORY — PX: PANCREATIC STENT PLACEMENT: SHX5539

## 2018-08-30 HISTORY — PX: SPHINCTEROTOMY: SHX5544

## 2018-08-30 HISTORY — PX: ERCP: SHX5425

## 2018-08-30 HISTORY — PX: REMOVAL OF STONES: SHX5545

## 2018-08-30 LAB — CBC WITH DIFFERENTIAL/PLATELET
Abs Immature Granulocytes: 0.06 10*3/uL (ref 0.00–0.07)
Basophils Absolute: 0 10*3/uL (ref 0.0–0.1)
Basophils Relative: 0 %
Eosinophils Absolute: 0.2 10*3/uL (ref 0.0–0.5)
Eosinophils Relative: 2 %
HCT: 39.5 % (ref 36.0–46.0)
HEMOGLOBIN: 12.4 g/dL (ref 12.0–15.0)
Immature Granulocytes: 1 %
Lymphocytes Relative: 12 %
Lymphs Abs: 1.3 10*3/uL (ref 0.7–4.0)
MCH: 29.6 pg (ref 26.0–34.0)
MCHC: 31.4 g/dL (ref 30.0–36.0)
MCV: 94.3 fL (ref 80.0–100.0)
MONO ABS: 1.7 10*3/uL — AB (ref 0.1–1.0)
Monocytes Relative: 16 %
Neutro Abs: 7.6 10*3/uL (ref 1.7–7.7)
Neutrophils Relative %: 69 %
Platelets: 117 10*3/uL — ABNORMAL LOW (ref 150–400)
RBC: 4.19 MIL/uL (ref 3.87–5.11)
RDW: 13.4 % (ref 11.5–15.5)
WBC: 10.9 10*3/uL — AB (ref 4.0–10.5)
nRBC: 0 % (ref 0.0–0.2)

## 2018-08-30 LAB — GLUCOSE, CAPILLARY
GLUCOSE-CAPILLARY: 172 mg/dL — AB (ref 70–99)
Glucose-Capillary: 110 mg/dL — ABNORMAL HIGH (ref 70–99)
Glucose-Capillary: 115 mg/dL — ABNORMAL HIGH (ref 70–99)
Glucose-Capillary: 127 mg/dL — ABNORMAL HIGH (ref 70–99)
Glucose-Capillary: 229 mg/dL — ABNORMAL HIGH (ref 70–99)
Glucose-Capillary: 244 mg/dL — ABNORMAL HIGH (ref 70–99)

## 2018-08-30 LAB — COMPREHENSIVE METABOLIC PANEL
ALK PHOS: 275 U/L — AB (ref 38–126)
ALT: 86 U/L — ABNORMAL HIGH (ref 0–44)
AST: 36 U/L (ref 15–41)
Albumin: 2.3 g/dL — ABNORMAL LOW (ref 3.5–5.0)
Anion gap: 8 (ref 5–15)
BUN: 8 mg/dL (ref 8–23)
CO2: 29 mmol/L (ref 22–32)
Calcium: 8.7 mg/dL — ABNORMAL LOW (ref 8.9–10.3)
Chloride: 98 mmol/L (ref 98–111)
Creatinine, Ser: 0.62 mg/dL (ref 0.44–1.00)
GFR calc Af Amer: >60 mL/min (ref >60)
GFR calc non Af Amer: >60 mL/min (ref >60)
Glucose, Bld: 121 mg/dL — ABNORMAL HIGH (ref 70–99)
Potassium: 3.4 mmol/L — ABNORMAL LOW (ref 3.5–5.1)
SODIUM: 135 mmol/L (ref 135–145)
Total Bilirubin: 1.2 mg/dL (ref 0.3–1.2)
Total Protein: 5.7 g/dL — ABNORMAL LOW (ref 6.5–8.1)

## 2018-08-30 LAB — MAGNESIUM: Magnesium: 1.7 mg/dL (ref 1.7–2.4)

## 2018-08-30 SURGERY — ERCP, WITH INTERVENTION IF INDICATED
Anesthesia: General

## 2018-08-30 MED ORDER — INDOMETHACIN 50 MG RE SUPP
RECTAL | Status: DC | PRN
  Administered 2018-08-30: 100 mg via RECTAL

## 2018-08-30 MED ORDER — LACTATED RINGERS IV SOLN: INTRAVENOUS | Status: DC

## 2018-08-30 MED ORDER — ONDANSETRON HCL 4 MG/2ML IJ SOLN
INTRAMUSCULAR | Status: DC | PRN
  Administered 2018-08-30: 4 mg via INTRAVENOUS

## 2018-08-30 MED ORDER — HYDROCHLOROTHIAZIDE 10 MG/ML ORAL SUSPENSION
6.2500 mg | Freq: Every day | ORAL | Status: DC
  Administered 2018-08-30 – 2018-09-03 (×5): 6.25 mg via ORAL
  Filled 2018-08-30 (×6): qty 1.25

## 2018-08-30 MED ORDER — INDOMETHACIN 50 MG RE SUPP
RECTAL | Status: AC
  Filled 2018-08-30: qty 2

## 2018-08-30 MED ORDER — FENTANYL CITRATE (PF) 250 MCG/5ML IJ SOLN
INTRAMUSCULAR | Status: AC
  Filled 2018-08-30: qty 5

## 2018-08-30 MED ORDER — PROPOFOL 10 MG/ML IV BOLUS
INTRAVENOUS | Status: AC
  Filled 2018-08-30: qty 20

## 2018-08-30 MED ORDER — PHENYLEPHRINE 40 MCG/ML (10ML) SYRINGE FOR IV PUSH (FOR BLOOD PRESSURE SUPPORT)
PREFILLED_SYRINGE | INTRAVENOUS | Status: DC | PRN
  Administered 2018-08-30 (×4): 80 ug via INTRAVENOUS

## 2018-08-30 MED ORDER — MAGNESIUM OXIDE 400 (241.3 MG) MG PO TABS
400.0000 mg | ORAL_TABLET | Freq: Every day | ORAL | Status: DC
  Administered 2018-08-30 – 2018-09-03 (×5): 400 mg via ORAL
  Filled 2018-08-30 (×6): qty 1

## 2018-08-30 MED ORDER — ROCURONIUM BROMIDE 10 MG/ML (PF) SYRINGE
PREFILLED_SYRINGE | INTRAVENOUS | Status: DC | PRN
  Administered 2018-08-30: 40 mg via INTRAVENOUS

## 2018-08-30 MED ORDER — AMOXICILLIN-POT CLAVULANATE 875-125 MG PO TABS
1.0000 | ORAL_TABLET | Freq: Two times a day (BID) | ORAL | Status: DC
  Administered 2018-08-30 – 2018-09-03 (×9): 1 via ORAL
  Filled 2018-08-30 (×9): qty 1

## 2018-08-30 MED ORDER — PROPOFOL 10 MG/ML IV BOLUS
INTRAVENOUS | Status: DC | PRN
  Administered 2018-08-30: 160 mg via INTRAVENOUS

## 2018-08-30 MED ORDER — SODIUM CHLORIDE 0.9 % IV SOLN
INTRAVENOUS | Status: DC | PRN
  Administered 2018-08-30: 40 mL

## 2018-08-30 MED ORDER — SODIUM CHLORIDE 0.9 % IV SOLN: INTRAVENOUS | Status: DC

## 2018-08-30 MED ORDER — FENTANYL CITRATE (PF) 100 MCG/2ML IJ SOLN
INTRAMUSCULAR | Status: AC
  Filled 2018-08-30: qty 2

## 2018-08-30 MED ORDER — GLUCAGON HCL RDNA (DIAGNOSTIC) 1 MG IJ SOLR
INTRAMUSCULAR | Status: AC
  Filled 2018-08-30: qty 1

## 2018-08-30 MED ORDER — SUGAMMADEX SODIUM 200 MG/2ML IV SOLN
INTRAVENOUS | Status: DC | PRN
  Administered 2018-08-30: 175 mg via INTRAVENOUS

## 2018-08-30 MED ORDER — FENTANYL CITRATE (PF) 100 MCG/2ML IJ SOLN
INTRAMUSCULAR | Status: DC | PRN
  Administered 2018-08-30: 80 ug via INTRAVENOUS
  Administered 2018-08-30 (×2): 50 ug via INTRAVENOUS

## 2018-08-30 MED ORDER — DEXAMETHASONE SODIUM PHOSPHATE 10 MG/ML IJ SOLN
INTRAMUSCULAR | Status: DC | PRN
  Administered 2018-08-30: 8 mg via INTRAVENOUS

## 2018-08-30 NOTE — Op Note (Signed)
[facility] Patient Name: Heidi Dominguez Procedure Date: 08/30/2018 MRN: [id number] Attending MD: Clarene Essex , MD Date of Birth: March 19, 1921 CSN: [id number] Age: 85 Admit Type: Inpatient Procedure:                ERCP Indications:              Abnormal MRCP, Biliary dilation on Ultrasound CT                            and MRI, Elevated liver enzymes with acute                            recurrent pancreatitis almost certainly biliary                            origin Providers:                Clarene Essex, MD, Cleda Daub, RN, Janie Billups,                            Technician, [facility], CRNA Referring MD:              Medicines:                General Anesthesia Complications:            No immediate complications. Estimated Blood Loss:     Estimated blood loss: none. Procedure:                Pre-Anesthesia Assessment:                           - Prior to the procedure, a History and Physical                            was performed, and patient medications and                            allergies were reviewed. The patient's tolerance of                            previous anesthesia was also reviewed. The risks                            and benefits of the procedure and the sedation                            options and risks were discussed with the patient.                            All questions were answered, and informed consent                            was obtained. Prior Anticoagulants: The patient has                            taken aspirin, last dose  was 1 day prior to                            procedure. ASA Grade Assessment: III - A patient                            with severe systemic disease. After reviewing the                            risks and benefits, the patient was deemed in                            satisfactory condition to undergo the procedure.                           After obtaining informed consent, the scope was                             passed under direct vision. Throughout the                            procedure, the patient's blood pressure, pulse, and                            oxygen saturations were monitored continuously. The                            TJF-Q180V ([id number] Olympus duodenoscope was                            introduced through the mouth, and used to inject                            contrast into and used to cannulate the bile duct.                            The ERCP was somewhat difficult due to abnormal                            anatomy. Successful completion of the procedure was                            aided by performing the maneuvers documented                            (below) in this report. The patient tolerated the                            procedure well. Scope In: Scope Out: Findings:      The major papilla was normal. On initial cannulation we thought we had       cannulated the CBD but with injecting dye and advancing the wire we were       actually in the pancreas and there seemed to  be a connection to the CBD       at the distal duct because dye filled the CBD as well and we initially       left the wire in the pancreas and tried to cannulate the CBD next to it       but were unsuccessful so we placed one 5 Fr by 5 cm plastic stent with a       3/4 external pigtail and no internal flaps was placed 4 cm into the       ventral pancreatic duct. The stent was in good position. We then tried       to cannulate using the sphincterotome next to the stent but were       unsuccessful however with injecting dye the bile duct dilation because       of the ampulla to be bulbous and we proceeded with A biliary pre-cut       sphincterotomy was made with a needle knife using a freehand technique       using ERBE electrocautery and we readily had biliary drainage and access       to the CBD and the wire was advanced into the CBD and dye was injected       to  confirm position and not mentioned above at least one stone was seen       in the CBD on the initial cholangiogram and we inserted the regular       sphincterotome. And proceeded with a Biliary sphincterotomy which was       made with a Hydratome sphincterotome using ERBE electrocautery until we       had excellent biliary drainage and could get the fully bowed       sphincterotome easily in and out of the duct. There was no       post-sphincterotomy bleeding. To discover objects, the biliary tree was       swept with an adjustable 15- 18 mm balloon starting at the bifurcation       and left main hepatic duct. A few stones were removed. They were tiny to       small no stones remained on subsequent multiple balloon pull-through's       and occlusion cholangiogram which due to air only gave [place] good pictures       of the proximal duct and we use the 15 mm balloon for every pull-through       until the last 1 where we used the 18 mm balloon from bifurcation until       we reached the sphincterotomy site and then we lowered it to 15 to       remove again not mentioned above all dye drain readily from the pancreas       with stent placement the scope was removed as was the wire and the       balloon catheter and the patient tolerated the procedure well Impression:               - The major papilla appeared norma initially until                            injection as documented above l.                           - Choledocholithiasis was found. Complete removal  was accomplished by biliary sphincterotomy and                            balloon extraction.                           - One plastic stent was placed into the ventral                            pancreatic duct.                           - A biliary sphincterotomy was performed initially                            using the needle-knife technique.                           - A biliary sphincterotomy was  performed increasing                            the size of the sphincterotomy in the customary                            fashion- The biliary tree was swept. Moderate Sedation:      Not Applicable - Patient had care per Anesthesia. Recommendation:           - Clear liquid diet for 6 hours.                           - Continue present medications.                           - Check liver enzymes (AST, ALT, alkaline                            phosphatase, bilirubin) and hemogram with white                            blood cell count and platelets tomorrow.                           - Return to GI clinic PRN. My rounding partner will                            check on tomorrow                           - Telephone GI clinic if symptomatic PRN.                           - Perform a flat plate and upright abdominal x-ray                            in 7 days to make sure PD stent passes on its own  and if not in a few weeks may need removal. Procedure Code(s):        --- Professional ---                           [phone], Endoscopic retrograde                            cholangiopancreatography (ERCP); with placement of                            endoscopic stent into biliary or pancreatic duct,                            including pre- and post-dilation and guide wire                            passage, when performed, including sphincterotomy,                            when performed, each stent                           43264, Endoscopic retrograde                            cholangiopancreatography (ERCP); with removal of                            calculi/debris from biliary/pancreatic duct(s)                           43262, 59, Endoscopic retrograde                            cholangiopancreatography (ERCP); with                            sphincterotomy/papillotomy Diagnosis Code(s):        --- Professional ---                           K80.50, Calculus of  bile duct without cholangitis                            or cholecystitis without obstruction                           R74.8, Abnormal levels of other serum enzymes                           K85.90, Acute pancreatitis without necrosis or                            infection, unspecified                           K83.8, Other specified diseases of biliary tract  R93.2, Abnormal findings on diagnostic imaging of                            liver and biliary tract CPT copyright 2018 American Medical Association. All rights reserved. The codes documented in this report are preliminary and upon coder review may  be revised to meet current compliance requirements. Clarene Essex, MD 08/30/2018 9:22:28 AM This report has been signed electronically. Number of Addenda: 0

## 2018-08-30 NOTE — Progress Notes (Signed)
[facility] PROGRESS NOTE  Heidi Dominguez JOA:[id number] DOB: [birth year] DOA: 08/26/2018 PCP: Lajean Manes, MD   Narrative: 85 year old female HTN HLD DM TY 2 -Recent admission 06/2018 for nonspecific elevation LFTs with intra-and extrahepatic biliary dilatation-at that time she was placed on Keflex for what appeared to be E. coli bacteremia and took it for 7 days -She had a lap chole 03/2015 after gallstone pancreatitis  Return to the emergency room 1/12 with gallstone pancreatitis-AST ALT 460/260 bilirubin normal lipase 3080-GI consulted and an ultrasound was done did not show stones lipase went down to 95 alkaline phosphate to 381 AST to 102 and ALT to 146 and she was advised to heart healthy from clear liquid diet and discharged on 1/14 she returned with severe sudden onset abdominal pain in the epigastric region radiating to the back with nausea vomiting without diarrhea Lipase was found to be 2773 up from [place] ? Gallstone/sludge pancreatitis MRI as below Afebrile switching from IV Zosyn to p.o. Augmentin ERCP as below with sphincterotomy-GI recommends clear liquid diet initially and then regular diet and x-ray in 1 week to see if plastic stent clears LFTs essentially normalized we will repeat in the morning along with CBC HTN- resumed amlodipine 10 mg daily-adding low-dose HCTZ 6.25 as well as uncontrolled Mild thrombocytopenia, mild leukocytosis Stable to improved at this time Diabetes mellitus type 2 on orals -can resume Januvia 50 daily on discharge Sugars are well controlled ranging 1 10-1 70 Chronic diastolic heart failure EF preserved 03/2015 -holding Lasix 20 mg at this time Monitor trends over the next day or so Stage II CKD -stable at this time improved compared to prior-recheck labs in a.m. R shoudler pain-  Likely rotator cuff related--asking OT to see for therapy eval and exercises-plain chest x-ray shows prior hemiarthroplasty and does usually have  difficulty with this hand but is now to the point where she cannot raise it resulting with swelling in the right arm--she will need outpatient follow-up with Dr. Wiliam Ke who has seen her for this in the remote past Reflux resume on Pepcid on discharge Constipation continue MiraLAX Urge incontinence continue Enablex 7.5 daily Mild overweight BMI 28  SCD, inpatient, discussed with daughter at the bedside and have explained that although she has had the procedure and looks better overall she may require skilled care and we will ask therapy to evaluate social worker has been intubated as well Expect will be able to discharge 1/20  Verlon Au, MD  [facility] Direct contact: [phone] --Via amion app OR  --[email or web address]; password [email or web address]  7PM-7AM contact night coverage as above 08/30/2018, 1:18 PM  LOS: 2 days   Consultants:  Gastroenterology  Interval history/Subjective:  Shoulder pain still present has not really been able to lift it Therapy attempted to see her but she was at her ERCP T-max overnight 102 No chills no rigors at present but is sleepy postprocedure   Objective:  Vitals:  Vitals:   08/30/18 0940 08/30/18 0946  BP: (!) 188/54   Pulse: 71 73  Resp: 13   Temp:    SpO2: 100% 100%    Exam:  Sleepy but arousable No chills no Reiger EOMI NCAT No abdominal tenderness no rebound no guarding Chest has no rales no rhonchi Is relatively clear Holosystolic murmur across precordium Lower extremity edema  I have personally reviewed the following:  DATA   Labs:  Alk phos 270 range down from 400 on admission  AST/ALT 36/86 down from  403 100 respectively on admission  Bilirubin 1.2  Potassium 3.4 magnesium 1.7  White count down from 12-10.9 hemoglobin 12 and stable  Imaging studies: MRCP 4/28 Uncomplicated acute pancreatitis.  Status post cholecystectomy. Stable mild intrahepatic and moderate extrahepatic ductal dilatation. No  choledocholithiasis is seen.  Small bilateral pleural effusions.  ERCP 1/17  Impression:               - The major papilla appeared norma initially until                            injection as documented above l.                           - Choledocholithiasis was found. Complete removal                            was accomplished by biliary sphincterotomy and                            balloon extraction.                           - One plastic stent was placed into the ventral                            pancreatic duct.                           - A biliary sphincterotomy was performed initially                            using the needle-knife technique.                           - A biliary sphincterotomy was performed increasing                            the size of the sphincterotomy in the customary                            fashion- The biliary tree was swept.  Scheduled Meds: . amLODipine  5 mg Oral Daily  . amoxicillin-clavulanate  1 tablet Oral Q12H  . darifenacin  7.5 mg Oral Daily  . insulin aspart  0-9 Units Subcutaneous Q4H  . magnesium oxide  400 mg Oral Daily  . pantoprazole  40 mg Oral Daily  . polyethylene glycol  17 g Oral Daily   Continuous Infusions: . lactated ringers 50 mL/hr at 08/29/18 1929    Principal Problem:   Abdominal pain Active Problems:   Essential hypertension   Diabetes mellitus type 2, controlled (HCC)   Chronic diastolic CHF (congestive heart failure) (HCC)   Elevated LFTs   CKD (chronic kidney disease) stage 3, GFR 30-59 ml/min (HCC)   Acute pancreatitis   LOS: 2 days

## 2018-08-30 NOTE — Anesthesia Preprocedure Evaluation (Signed)
Anesthesia Evaluation  Patient identified by MRN, date of birth, ID band Patient awake    Reviewed: Allergy & Precautions, H&P , NPO status , Patient's Chart, lab work & pertinent test results  History of Anesthesia Complications (+) PROLONGED EMERGENCE  Airway Mallampati: II  TM Distance: >3 FB Neck ROM: full    Dental  (+) Dental Advisory Given, Edentulous Upper, Edentulous Lower   Pulmonary shortness of breath and with exertion,    Pulmonary exam normal breath sounds clear to auscultation       Cardiovascular hypertension, Pt. on medications +CHF  Normal cardiovascular exam Rhythm:regular Rate:Normal  Bradycardia. Chronic diastolic CHF   Neuro/Psych negative neurological ROS  negative psych ROS   GI/Hepatic Neg liver ROS, hiatal hernia, GERD  Medicated and Controlled,  Endo/Other  diabetes, Well Controlled, Type 2, Oral Hypoglycemic Agents  Renal/GU negative Renal ROS  negative genitourinary   Musculoskeletal   Abdominal   Peds  Hematology negative hematology ROS (+)   Anesthesia Other Findings   Reproductive/Obstetrics negative OB ROS                             Anesthesia Physical  Anesthesia Plan  ASA: III  Anesthesia Plan: General   Post-op Pain Management:    Induction: Intravenous  PONV Risk Score and Plan: 3 and Ondansetron and Treatment may vary due to age or medical condition  Airway Management Planned: Oral ETT  Additional Equipment:   Intra-op Plan:   Post-operative Plan: Extubation in OR  Informed Consent: I have reviewed the patients History and Physical, chart, labs and discussed the procedure including the risks, benefits and alternatives for the proposed anesthesia with the patient or authorized representative who has indicated his/her understanding and acceptance.     Dental Advisory Given  Plan Discussed with: CRNA and Surgeon  Anesthesia Plan  Comments:         Anesthesia Quick Evaluation

## 2018-08-30 NOTE — Progress Notes (Signed)
Heidi Dominguez 7:39 AM  Subjective: Patient doing better today without fever without any other complaints and her hospital computer chart reviewed her case discussed with my partner Dr. Alessandra Dominguez as well as her daughter and we answered all of their questions  Objective: Signs stable afebrile no acute distress exam please see preassessment evaluation labs slightly improved CT MRI reviewed ultrasound reviewed  Assessment: Recurrent pancreatitis with increased liver tests  Plan: Okay to proceed with ERCP with anesthesia assistance and the success rate and risks was discussed with the patient and her daughter  [facility]  Pager [phone] After 5PM or if no answer call [phone]

## 2018-08-30 NOTE — Anesthesia Postprocedure Evaluation (Signed)
Anesthesia Post Note  Patient: Heidi Dominguez  Procedure(s) Performed: ENDOSCOPIC RETROGRADE CHOLANGIOPANCREATOGRAPHY (ERCP) (N/A ) SPHINCTEROTOMY REMOVAL OF STONES PANCREATIC STENT PLACEMENT     Patient location during evaluation: PACU Anesthesia Type: General Level of consciousness: awake and alert Pain management: pain level controlled Vital Signs Assessment: post-procedure vital signs reviewed and stable Respiratory status: spontaneous breathing, nonlabored ventilation, respiratory function stable and patient connected to nasal cannula oxygen Cardiovascular status: blood pressure returned to baseline and stable Postop Assessment: no apparent nausea or vomiting Anesthetic complications: no    Last Vitals:  Vitals:   08/30/18 0940 08/30/18 0946  BP: (!) 188/54   Pulse: 71 73  Resp: 13   Temp:    SpO2: 100% 100%    Last Pain:  Vitals:   08/30/18 1120  TempSrc:   PainSc: 5                  Montez Hageman

## 2018-08-30 NOTE — Transfer of Care (Signed)
Immediate Anesthesia Transfer of Care Note  Patient: Heidi Dominguez  Procedure(s) Performed: ENDOSCOPIC RETROGRADE CHOLANGIOPANCREATOGRAPHY (ERCP) (N/A ) SPHINCTEROTOMY REMOVAL OF STONES PANCREATIC STENT PLACEMENT  Patient Location: PACU  Anesthesia Type:General  Level of Consciousness: awake, alert  and oriented  Airway & Oxygen Therapy: Patient Spontanous Breathing and Patient connected to face mask oxygen  Post-op Assessment: Report given to RN and Post -op Vital signs reviewed and stable  Post vital signs: Reviewed and stable  Last Vitals:  Vitals Value Taken Time  BP 201/67 08/30/2018  9:27 AM  Temp    Pulse 77 08/30/2018  9:28 AM  Resp 21 08/30/2018  9:28 AM  SpO2 100 % 08/30/2018  9:28 AM  Vitals shown include unvalidated device data.  Last Pain:  Vitals:   08/30/18 6834  TempSrc: Oral  PainSc: 0-No pain      Patients Stated Pain Goal: 3 (19/62/22 9798)  Complications: No apparent anesthesia complications

## 2018-08-30 NOTE — Progress Notes (Signed)
OT Cancellation Note  Patient Details Name: LELAR FAREWELL MRN: [id number] DOB: 1921/07/02   Cancelled Treatment:    Reason Eval/Treat Not Completed: Patient at procedure or test/ unavailable  Will check back on pt later in day or next day  Kari Baars, [place] [phone] Office- [phone], Edwena Felty D 08/30/2018, 11:27 AM

## 2018-08-30 NOTE — Evaluation (Signed)
Physical Therapy Evaluation Patient Details Name: Heidi Dominguez MRN: [id number] DOB: 1921-06-14 Today's Date: 08/30/2018   History of Present Illness  Patient is a 85 y/o female presenting to the ED on 08/26/18 with primary complaints of abdominal pain. Of note, recent hospital admission for recurrent pancreatitis suspected to be secondary to gallstone. PMH significant for CHF, hypertension, hyperlipidemia, COPD. ERCP on 08/30/18.    Clinical Impression  Patient admitted with the above listed diagnosis. Patient reports she was ambulatory with walker prior to admission. Patient today requiring Max A for all bed level mobility and transfers. Patient with poor upright posturing throughout with poor balance and high risk for falls. PT to recommend SNF at discharge to progress safe functional mobility prior to return home. PT to continue to follow.     Follow Up Recommendations SNF;Supervision/Assistance - 24 hour    Equipment Recommendations  Other (comment)(defer)    Recommendations for Other Services OT consult     Precautions / Restrictions Precautions Precautions: Fall Restrictions Weight Bearing Restrictions: No      Mobility  Bed Mobility Overal bed mobility: Needs Assistance Bed Mobility: Supine to Sit     Supine to sit: Mod assist;Max assist     General bed mobility comments: Mod/Max A to come to EOB; poor upright posturing; inability to use R UE to assist  Transfers Overall transfer level: Needs assistance Equipment used: Rolling walker (2 wheeled) Transfers: Sit to/from [employer] Transfers Sit to Stand: Max assist;From elevated surface Stand pivot transfers: Max assist       General transfer comment: Max A from elevated bed height to come to upright standing; poor upright standing and limited weight shifting  Ambulation/Gait             General Gait Details: unable  Stairs            Wheelchair Mobility    Modified Rankin (Stroke  Patients Only)       Balance Overall balance assessment: Needs assistance Sitting-balance support: Bilateral upper extremity supported;Feet supported Sitting balance-Leahy Scale: Poor   Postural control: Posterior lean;Left lateral lean Standing balance support: Bilateral upper extremity supported;During functional activity Standing balance-Leahy Scale: Zero                               Pertinent Vitals/Pain Pain Assessment: Faces Faces Pain Scale: Hurts little more Pain Location: R shoulder with movement Pain Descriptors / Indicators: Aching;Discomfort;Grimacing;Guarding Pain Intervention(s): Limited activity within patient's tolerance;Monitored during session;Repositioned    Home Living Family/patient expects to be discharged to:: Private residence Living Arrangements: Alone Available Help at Discharge: (per chart review from 11/23 has 2 aides -didnt mention today) Type of Home: House Home Access: Ramped entrance     Home Layout: One level Home Equipment: [place] - 4 wheels;Walker - 2 wheels;Bedside commode;Shower seat      Prior Function Level of Independence: Needs assistance   Gait / Transfers Assistance Needed: uses rollator           Hand Dominance        Extremity/Trunk Assessment   Upper Extremity Assessment Upper Extremity Assessment: Defer to OT evaluation    Lower Extremity Assessment Lower Extremity Assessment: Generalized weakness    Cervical / Trunk Assessment Cervical / Trunk Assessment: Kyphotic  Communication   Communication: HOH  Cognition Arousal/Alertness: Awake/alert Behavior During Therapy: WFL for tasks assessed/performed Overall Cognitive Status: Within Functional Limits for tasks assessed  General Comments General comments (skin integrity, edema, etc.): daughter present and supportive    Exercises     Assessment/Plan    PT Assessment Patient needs  continued PT services  PT Problem List Decreased strength;Decreased activity tolerance;Decreased balance;Decreased mobility;Decreased coordination;Decreased knowledge of use of DME;Decreased safety awareness       PT Treatment Interventions DME instruction;Gait training;Functional mobility training;Therapeutic activities;Therapeutic exercise;Balance training;Patient/family education    PT Goals (Current goals can be found in the Care Plan section)  Acute Rehab PT Goals Patient Stated Goal: regain strength PT Goal Formulation: With patient Time For Goal Achievement: 09/13/18 Potential to Achieve Goals: Fair    Frequency Min 2X/week   Barriers to discharge        Co-evaluation               AM-PAC PT "6 Clicks" Mobility  Outcome Measure Help needed turning from your back to your side while in a flat bed without using bedrails?: A Lot Help needed moving from lying on your back to sitting on the side of a flat bed without using bedrails?: A Lot Help needed moving to and from a bed to a chair (including a wheelchair)?: A Lot Help needed standing up from a chair using your arms (e.g., wheelchair or bedside chair)?: A Lot Help needed to walk in hospital room?: Total Help needed climbing 3-5 steps with a railing? : Total 6 Click Score: 10    End of Session Equipment Utilized During Treatment: Gait belt Activity Tolerance: Patient tolerated treatment well;Patient limited by fatigue Patient left: in chair;with call bell/phone within reach;with chair alarm set;with family/visitor present Nurse Communication: Mobility status PT Visit Diagnosis: Unsteadiness on feet (R26.81);Other abnormalities of gait and mobility (R26.89);Muscle weakness (generalized) (M62.81)    Time: 6546-5035 PT Time Calculation (min) (ACUTE ONLY): 20 min   Charges:   PT Evaluation $PT Eval Moderate Complexity: 1 Mod          Lanney Gins, PT, DPT Supplemental Physical Therapist 08/30/18 2:31  PM Pager: [phone] Office: [phone]

## 2018-08-30 NOTE — Anesthesia Procedure Notes (Signed)
Procedure Name: Intubation Date/Time: 08/30/2018 7:53 AM Performed by: Jorryn Casagrande D, CRNA Pre-anesthesia Checklist: Patient identified, Emergency Drugs available, Suction available and Patient being monitored Patient Re-evaluated:Patient Re-evaluated prior to induction Oxygen Delivery Method: Circle system utilized Preoxygenation: Pre-oxygenation with 100% oxygen Induction Type: IV induction Ventilation: Mask ventilation without difficulty Laryngoscope Size: Mac and 3 Grade View: Grade I Tube type: Oral Tube size: 7.0 mm Number of attempts: 1 Airway Equipment and Method: Stylet Placement Confirmation: ETT inserted through vocal cords under direct vision,  positive ETCO2 and breath sounds checked- equal and bilateral Secured at: 21 cm Tube secured with: Tape Dental Injury: Teeth and Oropharynx as per pre-operative assessment

## 2018-08-31 LAB — CBC WITH DIFFERENTIAL/PLATELET
Abs Immature Granulocytes: 0.08 10*3/uL — ABNORMAL HIGH (ref 0.00–0.07)
Basophils Absolute: 0 10*3/uL (ref 0.0–0.1)
Basophils Relative: 0 %
Eosinophils Absolute: 0 10*3/uL (ref 0.0–0.5)
Eosinophils Relative: 0 %
HCT: 41.8 % (ref 36.0–46.0)
Hemoglobin: 12.8 g/dL (ref 12.0–15.0)
Immature Granulocytes: 1 %
Lymphocytes Relative: 11 %
Lymphs Abs: 1 10*3/uL (ref 0.7–4.0)
MCH: 29.8 pg (ref 26.0–34.0)
MCHC: 30.6 g/dL (ref 30.0–36.0)
MCV: 97.2 fL (ref 80.0–100.0)
Monocytes Absolute: 0.8 10*3/uL (ref 0.1–1.0)
Monocytes Relative: 8 %
NEUTROS ABS: 7.6 10*3/uL (ref 1.7–7.7)
Neutrophils Relative %: 80 %
PLATELETS: 133 10*3/uL — AB (ref 150–400)
RBC: 4.3 MIL/uL (ref 3.87–5.11)
RDW: 13.5 % (ref 11.5–15.5)
WBC: 9.5 10*3/uL (ref 4.0–10.5)
nRBC: 0 % (ref 0.0–0.2)

## 2018-08-31 LAB — COMPREHENSIVE METABOLIC PANEL
ALT: 67 U/L — AB (ref 0–44)
AST: 23 U/L (ref 15–41)
Albumin: 2.1 g/dL — ABNORMAL LOW (ref 3.5–5.0)
Alkaline Phosphatase: 263 U/L — ABNORMAL HIGH (ref 38–126)
Anion gap: 11 (ref 5–15)
BUN: 10 mg/dL (ref 8–23)
CHLORIDE: 99 mmol/L (ref 98–111)
CO2: 28 mmol/L (ref 22–32)
CREATININE: 0.52 mg/dL (ref 0.44–1.00)
Calcium: 8.8 mg/dL — ABNORMAL LOW (ref 8.9–10.3)
GFR calc Af Amer: >60 mL/min (ref >60)
GFR calc non Af Amer: >60 mL/min (ref >60)
Glucose, Bld: 182 mg/dL — ABNORMAL HIGH (ref 70–99)
Potassium: 3.9 mmol/L (ref 3.5–5.1)
Sodium: 138 mmol/L (ref 135–145)
Total Bilirubin: 0.5 mg/dL (ref 0.3–1.2)
Total Protein: 5.9 g/dL — ABNORMAL LOW (ref 6.5–8.1)

## 2018-08-31 LAB — GLUCOSE, CAPILLARY
Glucose-Capillary: 123 mg/dL — ABNORMAL HIGH (ref 70–99)
Glucose-Capillary: 127 mg/dL — ABNORMAL HIGH (ref 70–99)
Glucose-Capillary: 145 mg/dL — ABNORMAL HIGH (ref 70–99)
Glucose-Capillary: 157 mg/dL — ABNORMAL HIGH (ref 70–99)
Glucose-Capillary: 161 mg/dL — ABNORMAL HIGH (ref 70–99)
Glucose-Capillary: 176 mg/dL — ABNORMAL HIGH (ref 70–99)
Glucose-Capillary: 193 mg/dL — ABNORMAL HIGH (ref 70–99)

## 2018-08-31 MED ORDER — HYDROCODONE-ACETAMINOPHEN 5-325 MG PO TABS
1.0000 | ORAL_TABLET | Freq: Four times a day (QID) | ORAL | Status: DC | PRN
  Filled 2018-08-31: qty 1

## 2018-08-31 MED ORDER — ACETAMINOPHEN 500 MG PO TABS
500.0000 mg | ORAL_TABLET | Freq: Four times a day (QID) | ORAL | Status: DC | PRN
  Administered 2018-08-31 – 2018-09-03 (×6): 500 mg via ORAL
  Filled 2018-08-31 (×6): qty 1

## 2018-08-31 NOTE — Progress Notes (Signed)
Patient reports Hydrocodone, Codeine, "makes me go out of my head, loopy".  Patient's daughter reports patient is "sensitive to narcotics".  Dr. Verlon Au notified via text page.  Dr. Verlon Au gave order to discontinue Hydrocodone and order for Tylenol.

## 2018-08-31 NOTE — Progress Notes (Signed)
Spoke with [name] pt's daughter and HCPOA concerning pt's discharge plan. [name] asked for pt to go to CIR. [name] feel that her mother would do better there than a SNF. Explained to [name] that her mother would need to perform initive PT. [name] had a friend with MS that could not do 5 hours of PT, however went to CIR, so [name] is asking that her mother go to CIR. [name] also stated that she would need time to go see SNF facilities. Explained to [place] in a nice way that she could go visit any facility taht she want on today and tomorrow without pt being discharged. [name] also asked for a MRI of her mother's right arm, and stated that her mother(pt) did not have this problem before coming into [facility]. MD made aware.

## 2018-08-31 NOTE — Plan of Care (Signed)
Pt had good urine OP this shift. 

## 2018-08-31 NOTE — Clinical Social Work Note (Signed)
Clinical Social Work Assessment  Patient Details  Name: Heidi Dominguez MRN: [id number] Date of Birth: 08/01/1921  Date of referral:  08/30/18               Reason for consult:  Facility Placement                Permission sought to share information with:  Facility Art therapist granted to share information::  Yes, Verbal Permission Granted  Name::     Financial trader::  SNF  Relationship::  Daughter  Contact Information:  [phone]  Housing/Transportation Living arrangements for the past 2 months:  Single Family Home Source of Information:  Patient, Adult Children Patient Interpreter Needed:  None Criminal Activity/Legal Involvement Pertinent to Current Situation/Hospitalization:  No - Comment as needed Significant Relationships:  Adult Children, Friend Lives with:  Self Do you feel safe going back to the place where you live?  Yes Need for family participation in patient care:  Yes (Comment)  Care giving concerns:  Patient lives alone and has someone who assists her with tasks 3x/week. PT/OT recommending SNF for short term therapy before returning home.    Social Worker assessment / plan:  CSW spoke with patient and friend/home aide at bedside with daughter, [name], on speaker phone to discuss plans for discharge.   Patient currently lives alone but has someone who comes 3x/week to assist with needs. Patient states aide is able to stay the night, if needed. Patient's children lives in [place] and are a support to her.  Patient's daughter/POA, Heidi Dominguez, has strong preference for [facility] inpatient rehab. Explained that patient is likely not a candidate for rehab at that level of care. Daughter expressed frustration with having been told varying information regarding qualifications and eligibility to admit to inpatient rehab. CSW does not handle CIR referrals often and texted RNCM (will f/u if do not hear back from) to speak to patient's daughter for  clear explanation. Daughter primarily wants more information on CIR placement and how patient could qualify. Daughter and patient did not have preference for SNF level rehab but knows that it is a possibility patient will not qualify for CIR.   CSW will complete FL2 and send out SNF referrals to anticipate that being discharge plan.   Employment status:  Retired Forensic scientist:  Medicare PT Recommendations:  [place] / Referral to community resources:  [place]  Patient/Family's Response to care:  Patient and daughter are pleasant and appreciative of CSW involvement. Daughter wants more information regarding CIR.  Patient/Family's Understanding of and Emotional Response to Diagnosis, Current Treatment, and Prognosis:  Patient's daughter does not understand various rehab qualification requirements and recommendations. CSW will attempt to get clear qualification guidelines for CIR placement to explain to daughter.  Emotional Assessment Appearance:  Appears stated age Attitude/Demeanor/Rapport:    Affect (typically observed):  Appropriate, Pleasant, Calm Orientation:  Oriented to Situation, Oriented to  Time, Oriented to Place, Oriented to Self Alcohol / Substance use:  Not Applicable Psych involvement (Current and /or in the community):  No (Comment)  Discharge Needs  Concerns to be addressed:  Care Coordination Readmission within the last 30 days:  No Current discharge risk:  Lives alone, Physical Impairment Barriers to Discharge:  Continued Medical Work up   [employer], [place] 08/31/2018, 11:19 AM

## 2018-08-31 NOTE — Progress Notes (Signed)
No complaints post ERCP. Eating. No pain. Will sign off. Call us if needed. Follow up with Dr Watt Climes.

## 2018-08-31 NOTE — Evaluation (Signed)
Occupational Therapy Evaluation Patient Details Name: Heidi Dominguez MRN: [id number] DOB: 10/26/1920 Today's Date: 08/31/2018    History of Present Illness Patient is a 85 y/o female presenting to the ED on 08/26/18 with primary complaints of abdominal pain. Of note, recent hospital admission for recurrent pancreatitis suspected to be secondary to gallstone. PMH significant for CHF, hypertension, hyperlipidemia, COPD. ERCP on 08/30/18.   Clinical Impression   Pt was admitted for the above.  Unsure of how much assistance she had prior to admission. She reports that RUE was moving but c/o bil numbness.  Unable to tolerate shoulder movement out of neutral position, other than gravity eliminated rotation to neutral.  Elbow to wrist move, but fingers limited by edema and arthritic changes.  Will follow in acute setting with the goals listed below    Follow Up Recommendations  SNF    Equipment Recommendations  3 in 1 bedside commode  ? Sling for mobility   Recommendations for Other Services       Precautions / Restrictions Precautions Precautions: Fall Restrictions Weight Bearing Restrictions: No      Mobility Bed Mobility               General bed mobility comments: could not tolerate rolling today  Transfers                 General transfer comment: per PT notes, max A SPT; nursing staff reports +3 assistance today    Balance                                           ADL either performed or assessed with clinical judgement   ADL Overall ADL's : Needs assistance/impaired                                       General ADL Comments: total A for all ADLs, +2 to roll.  Used BSC with +3 nursing staff prior to OT's arrival.  Pt cannot self feed with LUE; she c/o dropping things.  Dropped off lidded mug and built up foam to try for self feeding with non-dominant hand     Vision         Perception     Praxis      Pertinent  Vitals/Pain Pain Assessment: Faces Faces Pain Scale: Hurts whole lot Pain Location: R shoulder with movement Pain Descriptors / Indicators: Aching;Discomfort;Grimacing;Guarding Pain Intervention(s): Limited activity within patient's tolerance;Monitored during session;Repositioned     Hand Dominance     Extremity/Trunk Assessment Upper Extremity Assessment Upper Extremity Assessment: RUE deficits/detail RUE Deficits / Details: pt states she could move RUE prior to hospitalization.  She cannot tolerate any FF/ABD from neutral position.  Able to tolerate neutral rotation in gravity eliminated plane.  Able to flex/extend elbow, move wrist and fingers (which are swollen) c/o numbness bil UEs           Communication Communication Communication: HOH   Cognition Arousal/Alertness: Awake/alert Behavior During Therapy: WFL for tasks assessed/performed Overall Cognitive Status: Within Functional Limits for tasks assessed                                     General Comments  Exercises Exercises: Other exercises Other Exercises Other Exercises: educated on scapula retraction exercises; pt already positioned with pillow under upper arm Other Exercises: educated on edema control, encouraged AROM of fingers, positioned on pillows and performed retrograde massage   Shoulder Instructions      Home Living   Living Arrangements: Alone                                      Prior Functioning/Environment                   OT Problem List: Decreased strength;Decreased range of motion;Decreased activity tolerance;Pain;Decreased knowledge of use of DME or AE(balance NT)      OT Treatment/Interventions: Self-care/ADL training;DME and/or AE instruction;Patient/family education;Therapeutic activities;Therapeutic exercise(balance as needed)--impaired per PT   OT Goals(Current goals can be found in the care plan section) Acute Rehab OT Goals Patient  Stated Goal: regain strength OT Goal Formulation: With patient Time For Goal Achievement: 09/14/18 Potential to Achieve Goals: Fair ADL Goals Pt Will Perform Eating: with min assist;bed level;with adaptive utensils(with LUE) Pt Will Perform Grooming: with min assist;bed level;with adaptive equipment(with LUE) Pt Will Perform Upper Body Bathing: with min assist;with adaptive equipment;bed level Pt Will Transfer to Toilet: with mod assist;bedside commode;stand pivot transfer Additional ADL Goal #1: pt will perform bed mobility with mod A in preparation for adls  OT Frequency: Min 2X/week   Barriers to D/C:            Co-evaluation              AM-PAC OT "6 Clicks" Daily Activity     Outcome Measure Help from another person eating meals?: Total Help from another person taking care of personal grooming?: Total Help from another person toileting, which includes using toliet, bedpan, or urinal?: Total Help from another person bathing (including washing, rinsing, drying)?: Total Help from another person to put on and taking off regular upper body clothing?: Total Help from another person to put on and taking off regular lower body clothing?: Total 6 Click Score: 6   End of Session    Activity Tolerance: Patient limited by pain Patient left: in bed;with call bell/phone within reach;with family/visitor present  OT Visit Diagnosis: Pain Pain - Right/Left: Right Pain - part of body: Shoulder                Time: 0017-4944 OT Time Calculation (min): 19 min Charges:  OT General Charges $OT Visit: 1 Visit OT Evaluation $OT Eval Low Complexity: [place], OTR/L Acute Rehabilitation Services [phone] [facility] pager [phone] office 08/31/2018  [place] 08/31/2018, 3:12 PM

## 2018-08-31 NOTE — NC FL2 (Signed)
[place] LEVEL OF CARE SCREENING TOOL     IDENTIFICATION  Patient Name: Heidi Dominguez Birthdate: [birth year] Sex: female Admission Date (Current Location): 08/26/2018  [facility] and [place] Number:  Herbalist and Address:  [facility],  [place] [address], [place]      Provider Number: [id number]  Attending Physician Name and Address:  Nita Sells, MD  Relative Name and Phone Number:  Bobbie Virden: [phone]    Current Level of Care: Hospital Recommended Level of Care: [place] Prior Approval Number:    Date Approved/Denied:   PASRR Number: [id number]  Discharge Plan: SNF    Current Diagnoses: Patient Active Problem List   Diagnosis Date Noted  . Acute pancreatitis 08/28/2018  . Acute recurrent pancreatitis   . Abdominal pain 08/23/2018  . Elevated LFTs 07/04/2018  . CKD (chronic kidney disease) stage 3, GFR 30-59 ml/min (HCC) 07/04/2018  . Upper airway cough syndrome 03/13/2018  . Sensorineural hearing loss (SNHL), bilateral 12/04/2016  . Post-nasal drainage 03/14/2016  . Presbycusis of both ears 03/14/2016  . Bilateral hearing loss 10/06/2015  . Bilateral impacted cerumen 10/06/2015  . 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 Subjective tinnitus of both ears 10/06/2015  . Choledocholithiasis with chronic cholecystitis, nonobstructing 04/11/2015  . Pre-operative cardiovascular examination, high risk surgery   . Epigastric abdominal pain 04/08/2015  . Acute gallstone pancreatitis s/p lap chole w [facility] 04/11/2015 04/08/2015  . Diabetes mellitus type 2, controlled ([place]) 04/08/2015  . Gallstone pancreatitis 04/08/2015  . Mitral regurgitation 04/08/2015  . Chronic diastolic CHF (congestive heart failure) ([place]) 04/08/2015  . Dyspnea 03/31/2015  . Ischemic colitis ([place]) 08/09/2012  . Colitis 08/07/2012  . Rectal bleeding 08/07/2012  . Weakness 09/24/2011  . Bradycardia  09/24/2011  . Acute lower UTI 09/24/2011  . Hematochezia 09/24/2011  . Essential hypertension 09/24/2011  . Dyslipidemia 09/24/2011    Orientation RESPIRATION BLADDER Height & Weight     Self, Time, Situation, Place    External catheter Weight: 172 lb 13.5 oz (78.4 kg) Height:  5\' 4"  (162.6 cm)  BEHAVIORAL SYMPTOMS/MOOD NEUROLOGICAL BOWEL NUTRITION STATUS      Incontinent Diet(Regular)  AMBULATORY STATUS COMMUNICATION OF NEEDS Skin   Extensive Assist Verbally                         Personal Care Assistance Level of Assistance  Bathing, Feeding, Dressing Bathing Assistance: Maximum assistance Feeding assistance: Limited assistance Dressing Assistance: Maximum assistance     Functional Limitations Info  Sight, Hearing, Speech Sight Info: Adequate Hearing Info: Adequate Speech Info: Adequate    SPECIAL CARE FACTORS FREQUENCY  PT (By licensed PT), OT (By licensed OT)     PT Frequency: 5x/week OT Frequency: 5x/week            Contractures Contractures Info: Not present    Additional Factors Info  Code Status, Allergies, Isolation Precautions Code Status Info: Full Allergies Info: CATAPRES CLONIDINE HCL, COVERA-HS VERAPAMIL HCL, OTHER, SULFA ANTIBIOTICS, SULFAMETHOXAZOLE, VERAPAMIL, SULFASALAZINE            Current Medications (08/31/2018):  This is the current hospital active medication list Current Facility-Administered Medications  Medication Dose Route Frequency Provider Last Rate Last Dose  . albuterol (PROVENTIL) (2.5 MG/3ML) 0.083% nebulizer solution 2.5 mg  2.5 mg Nebulization Q4H PRN Clarene Essex, MD   2.5 mg at 08/30/18 0001  . amLODipine (NORVASC) tablet 5 mg  5 mg Oral  Daily Clarene Essex, MD   5 mg at 08/31/18 1009  . amoxicillin-clavulanate (AUGMENTIN) 875-125 MG per tablet 1 tablet  1 tablet Oral Q12H Nita Sells, MD   1 tablet at 08/31/18 1009  . darifenacin (ENABLEX) 24 hr tablet 7.5 mg  7.5 mg Oral Daily Clarene Essex, MD   7.5 mg at  08/31/18 1009  . fentaNYL (SUBLIMAZE) injection 25 mcg  25 mcg Intravenous Q2H PRN Clarene Essex, MD   25 mcg at 08/31/18 0449  . hydrochlorothiazide 10 mg/mL oral suspension 6.25 mg  6.25 mg Oral Daily Nita Sells, MD   6.25 mg at 08/30/18 1748  . insulin aspart (novoLOG) injection 0-9 Units  0-9 Units Subcutaneous Q4H Clarene Essex, MD   1 Units at 08/31/18 1007  . lactated ringers infusion   Intravenous Continuous Clarene Essex, MD 50 mL/hr at 08/30/18 1456    . magnesium oxide (MAG-OX) tablet 400 mg  400 mg Oral Daily Clarene Essex, MD   400 mg at 08/31/18 1009  . ondansetron (ZOFRAN) tablet 4 mg  4 mg Oral Q6H PRN Clarene Essex, MD       Or  . ondansetron [facility] injection 4 mg  4 mg Intravenous Q6H PRN Clarene Essex, MD      . pantoprazole (PROTONIX) EC tablet 40 mg  40 mg Oral Daily Clarene Essex, MD   40 mg at 08/31/18 1009  . polyethylene glycol (MIRALAX / GLYCOLAX) packet 17 g  17 g Oral Daily Clarene Essex, MD   17 g at 08/31/18 1009  . polyvinyl alcohol (LIQUIFILM TEARS) 1.4 % ophthalmic solution 1 drop  1 drop Both Eyes Daily PRN Clarene Essex, MD   1 drop at 08/31/18 0133     Discharge Medications: Please see discharge summary for a list of discharge medications.  Relevant Imaging Results:  Relevant Lab Results:   Additional Information SSN: [id number]  Pricilla Holm, [place]

## 2018-08-31 NOTE — Progress Notes (Signed)
[facility] PROGRESS NOTE  DIAMANTINA EDINGER EZM:[id number] DOB: 13-May-1921 DOA: 08/26/2018 PCP: Lajean Manes, MD   Narrative: 85 year old female HTN HLD DM TY 2 -Recent admission 06/2018 for nonspecific elevation LFTs with intra-and extrahepatic biliary dilatation-at that time she was placed on Keflex for what appeared to be E. coli bacteremia and took it for 7 days -She had a lap chole 03/2015 after gallstone pancreatitis  Return to the emergency room 1/12 with gallstone pancreatitis-AST ALT 460/260 bilirubin normal lipase 3080-GI consulted and an ultrasound was done did not show stones lipase went down to 95 alkaline phosphate to 381 AST to 102 and ALT to 146 and she was advised to heart healthy from clear liquid diet and discharged on 1/14 she returned with severe sudden onset abdominal pain in the epigastric region radiating to the back with nausea vomiting without diarrhea Lipase was found to be 2773 up from [place] ? Gallstone/sludge pancreatitis ERCP as below with sphincterotomy-GI grad to reg diet x-ray in 1 week to see if plastic stent clears   IV Zosyn to p.o. Augmentin, complete on 1/21 [5 days]  Labs stable HTN- resumed amlodipine 10 mg daily-adding low-dose HCTZ 6.25 as  uncontrolled Mild thrombocytopenia, mild leukocytosis Stable to improved at this time Diabetes mellitus type 2 on orals -can resume Januvia 50 daily on discharge Sugars are well controlled ranging [id number] Chronic diastolic heart failure EF preserved 03/2015 Net +, d/c saline-resume in am home lasix 20 Stage II CKD -stable at this time improved compared to prior-recheck labs in a.m. R shoudler pain-  Likely rotator cuff related--asking OT to see for therapy eval and exercises-plain chest x-ray shows prior hemiarthroplasty and does usually have difficulty with this hand but is now to the point where she cannot raise it resulting with swelling in the right arm-needs ORtho eval Dr. Jeannie Fend as  OP Reflux resume on Pepcid on discharge Constipation continue MiraLAX Urge incontinence continue Enablex 7.5 daily Mild overweight BMI 28  SCD, inpatient, discussed with daughter at the bedside and have explained that although she has had the procedure and looks better overall she may require skilled care  I doubt very much she is a CIR candidate and NCM to look in on eliegibility Will require SNF when able to coordinate  Verlon Au, MD  [facility] Direct contact: [phone] --Via amion app [place]  --[email or web address]; password [email or web address]  7PM-7AM contact night coverage as above 08/31/2018, 1:46 PM  LOS: 3 days   Consultants:  Gastroenterology  Interval history/Subjective:  No fever no chill Still has pain in shoulder on R therpay hasnt been able to see yet No cp Wants reg diet    Objective:  Vitals:  Vitals:   08/31/18 0416 08/31/18 1338  BP: (!) 157/75 (!) 166/71  Pulse: 62 68  Resp: 18 16  Temp: 97.6 F (36.4 C) 97.8 F (36.6 C)  SpO2: 100% 94%    Exam:  Awake pleasant no distress eomi ncat No abdominal tenderness Chest has no rales no rhonchi Is relatively clear Still diffuse swelling RUE--pain in bicipital groove--cannot raise arm above head on R side Holosystolic murmur across precordium Lower extremity edema  I have personally reviewed the following:  DATA   Labs:  Alk phos 270 range down and stable  AST/ALT normalized  Potassium 3.4 magnesium 1.7  White count down from 12-10.9-->9.5  Imaging studies: MRCP 0/17 Uncomplicated acute pancreatitis.  Status post cholecystectomy. Stable mild intrahepatic and moderate extrahepatic ductal dilatation. No choledocholithiasis is  seen.  Small bilateral pleural effusions.  ERCP 1/17  Impression:               - The major papilla appeared norma initially until                            injection as documented above l.                           - Choledocholithiasis was found. Complete removal                             was accomplished by biliary sphincterotomy and                            balloon extraction.                           - One plastic stent was placed into the ventral                            pancreatic duct.                           - A biliary sphincterotomy was performed initially                            using the needle-knife technique.                           - A biliary sphincterotomy was performed increasing                            the size of the sphincterotomy in the customary                            fashion- The biliary tree was swept.  Scheduled Meds: . amLODipine  5 mg Oral Daily  . amoxicillin-clavulanate  1 tablet Oral Q12H  . darifenacin  7.5 mg Oral Daily  . hydrochlorothiazide  6.25 mg Oral Daily  . insulin aspart  0-9 Units Subcutaneous Q4H  . magnesium oxide  400 mg Oral Daily  . pantoprazole  40 mg Oral Daily  . polyethylene glycol  17 g Oral Daily   Continuous Infusions: . lactated ringers 50 mL/hr at 08/31/18 1236    Principal Problem:   Abdominal pain Active Problems:   Essential hypertension   Diabetes mellitus type 2, controlled (HCC)   Chronic diastolic CHF (congestive heart failure) (HCC)   Elevated LFTs   CKD (chronic kidney disease) stage 3, GFR 30-59 ml/min (HCC)   Acute pancreatitis   LOS: 3 days          

## 2018-09-01 LAB — GLUCOSE, CAPILLARY
GLUCOSE-CAPILLARY: 115 mg/dL — AB (ref 70–99)
Glucose-Capillary: 98 mg/dL (ref 70–99)
Glucose-Capillary: 135 mg/dL — ABNORMAL HIGH (ref 70–99)
Glucose-Capillary: 219 mg/dL — ABNORMAL HIGH (ref 70–99)
Glucose-Capillary: 216 mg/dL — ABNORMAL HIGH (ref 70–99)

## 2018-09-01 LAB — COMPREHENSIVE METABOLIC PANEL
ALT: 50 U/L — ABNORMAL HIGH (ref 0–44)
AST: 18 U/L (ref 15–41)
Albumin: 2 g/dL — ABNORMAL LOW (ref 3.5–5.0)
Alkaline Phosphatase: 234 U/L — ABNORMAL HIGH (ref 38–126)
Anion gap: 9 (ref 5–15)
BUN: 11 mg/dL (ref 8–23)
CALCIUM: 8.7 mg/dL — AB (ref 8.9–10.3)
CHLORIDE: 99 mmol/L (ref 98–111)
CO2: 30 mmol/L (ref 22–32)
Creatinine, Ser: 0.67 mg/dL (ref 0.44–1.00)
GFR calc Af Amer: >60 mL/min (ref >60)
GFR calc non Af Amer: >60 mL/min (ref >60)
Glucose, Bld: 117 mg/dL — ABNORMAL HIGH (ref 70–99)
Potassium: 3.4 mmol/L — ABNORMAL LOW (ref 3.5–5.1)
SODIUM: 138 mmol/L (ref 135–145)
Total Bilirubin: 0.7 mg/dL (ref 0.3–1.2)
Total Protein: 5.6 g/dL — ABNORMAL LOW (ref 6.5–8.1)

## 2018-09-01 NOTE — Progress Notes (Signed)
[facility] PROGRESS NOTE  EDYE HAINLINE UDJ:[id number] DOB: 09/29/1920 DOA: 08/26/2018 PCP: Lajean Manes, MD   Narrative: 85 year old female HTN HLD DM TY 2 -Recent admission 06/2018 for nonspecific elevation LFTs with intra-and extrahepatic biliary dilatation-at that time she was placed on Keflex for what appeared to be E. coli bacteremia and took it for 7 days -She had a lap chole 03/2015 after gallstone pancreatitis  Return to the emergency room 1/12 with gallstone pancreatitis-AST ALT 460/260 bilirubin normal lipase 3080-GI consulted and an ultrasound was done did not show stones lipase went down to 95 alkaline phosphate to 381 AST to 102 and ALT to 146 and she was advised to heart healthy from clear liquid diet and discharged on 1/14 she returned with severe sudden onset abdominal pain in the epigastric region radiating to the back with nausea vomiting without diarrhea Lipase was found to be 2773 up from 95  Underwent EUS and then sphincterotomy this admission with resolution hospitalization complicated by increasing right shoulder pain with inability to move x-rays done MRI is pending at this time Will require skilled level care   A & Plan ? Gallstone/sludge pancreatitis ERCP as below with sphincterotomy-GI grad to reg diet x-ray in 1 week to see if plastic stent clears   IV Zosyn to p.o. Augmentin, complete on 1/21  [5 days]  Wbc is down R shoudler pain-  Likely rotator cuff related--asking OT to see for therapy eval and exercises-plain chest x-ray shows prior hemiarthroplasty-MRI shoulder is pending we will need outpatient management as not an operative candidate as is on antibiotics Place in sling for comfort HTN- resumed amlodipine 10 mg daily-HCTZ 6.25 added-proved Mild thrombocytopenia, mild leukocytosis Stable to improved at this time Diabetes mellitus type 2 on orals -can resume Januvia 50 daily on discharge Sugars 1 15-1 35 Chronic diastolic heart failure EF  preserved 03/2015 Net +, d/c saline now on HCTZ Stage II CKD -stable at this time improved compared to prior-recheck labs in a.m.  Reflux resume on Pepcid on discharge Constipation continue MiraLAX Urge incontinence continue Enablex 7.5 daily Mild overweight BMI 28  SCD, inpatient, discussed with son at the bedside Await MRI of shoulder Will require SNF when able to coordinate  Verlon Au, MD  [facility] Direct contact: [phone] --Via [place]  --[email or web address]; password [email or web address]  7PM-7AM contact night coverage as above 09/01/2018, 2:29 PM  LOS: 4 days   Consultants:  Gastroenterology  Interval history/Subjective:  No fever no chill Still has pain in shoulder on R therpay hasnt been able to see yet No cp Wants reg diet    Objective:  Vitals:  Vitals:   09/01/18 0750 09/01/18 0854  BP: (!) 147/61   Pulse: 75   Resp: 13   Temp:    SpO2: (!) 88% 93%    Exam:  Alert pleasant oriented no distress No fever tolerating diet More coherent Therapy recommends skilled Son at bedside  I have personally reviewed the following:  DATA   Labs:   Potassium 3.4   White count down from 12-10.9-->9.5  Imaging studies: MRCP 2/87 Uncomplicated acute pancreatitis.  Status post cholecystectomy. Stable mild intrahepatic and moderate extrahepatic ductal dilatation. No choledocholithiasis is seen.  Small bilateral pleural effusions.  ERCP 1/17  Impression:               - The major papilla appeared norma initially until  injection as documented above l.                           - Choledocholithiasis was found. Complete removal                            was accomplished by biliary sphincterotomy and                            balloon extraction.                           - One plastic stent was placed into the ventral                            pancreatic duct.                           - A biliary sphincterotomy was  performed initially                            using the needle-knife technique.                           - A biliary sphincterotomy was performed increasing                            the size of the sphincterotomy in the customary                            fashion- The biliary tree was swept.  Scheduled Meds: . amLODipine  5 mg Oral Daily  . amoxicillin-clavulanate  1 tablet Oral Q12H  . darifenacin  7.5 mg Oral Daily  . hydrochlorothiazide  6.25 mg Oral Daily  . insulin aspart  0-9 Units Subcutaneous Q4H  . magnesium oxide  400 mg Oral Daily  . pantoprazole  40 mg Oral Daily  . polyethylene glycol  17 g Oral Daily   Continuous Infusions:   Principal Problem:   Abdominal pain Active Problems:   Essential hypertension   Diabetes mellitus type 2, controlled (HCC)   Chronic diastolic CHF (congestive heart failure) (HCC)   Elevated LFTs   CKD (chronic kidney disease) stage 3, GFR 30-59 ml/min (HCC)   Acute pancreatitis   LOS: 4 days

## 2018-09-02 ENCOUNTER — Encounter (HOSPITAL_COMMUNITY): Payer: Self-pay | Admitting: Gastroenterology

## 2018-09-02 ENCOUNTER — Inpatient Hospital Stay (HOSPITAL_COMMUNITY): Payer: Medicare Other

## 2018-09-02 LAB — GLUCOSE, CAPILLARY
GLUCOSE-CAPILLARY: 115 mg/dL — AB (ref 70–99)
Glucose-Capillary: 122 mg/dL — ABNORMAL HIGH (ref 70–99)
Glucose-Capillary: 146 mg/dL — ABNORMAL HIGH (ref 70–99)
Glucose-Capillary: 169 mg/dL — ABNORMAL HIGH (ref 70–99)
Glucose-Capillary: 209 mg/dL — ABNORMAL HIGH (ref 70–99)
Glucose-Capillary: 215 mg/dL — ABNORMAL HIGH (ref 70–99)

## 2018-09-02 NOTE — Consult Note (Addendum)
Patient ID: Heidi Dominguez MRN: [id number] DOB/AGE: [birth year] 85 y.o.  Admit date: 08/26/2018  Chief Complaint: Right shoulder pain  Subjective: Heidi Dominguez is a 85 y/o female with PMH significant for right shoulder hemiarthroplasty performed by [name], MD in 2010 who was admitted to [facility] on 08/26/2018 for abdominal pain. She was found to have progressive right shoulder pain without any known injury and orthopedics was consulted for management. Patient states pain has been present for approximately 2 weeks, beginning prior to hospitalization. Pain has worsened over her hospital stay and is exacerbated with any attempted movement of the right arm. Reports sling has helped with her pain and she is resting comfortably in the recliner. X-rays of the right shoulder show prosthesis in good position with no acute abnormalities. She is scheduled for an MRI, currently pending.  Allergies: Allergies  Allergen Reactions  . Catapres [Clonidine Hcl] Other (See Comments)    Unknown. Unknown.  . Codeine Other (See Comments)    "Makes me out of my head, loopy"  . Covera-Hs [Verapamil Hcl] Other (See Comments)    Unknown.  Marland Kitchen Hydrocodone Other (See Comments)    "makes me go out of my head, loopy"  . Other Other (See Comments)    Any type of narcotics Feels "loopy" Any type of narcotics Feels "loopy"  . Sulfa Antibiotics Itching  . Sulfamethoxazole Diarrhea  . Verapamil Diarrhea and Other (See Comments)  . Sulfasalazine Itching     Medications: Medications Prior to Admission  Medication Sig Dispense Refill Last Dose  . albuterol (PROVENTIL HFA;VENTOLIN HFA) 108 (90 Base) MCG/ACT inhaler Inhale into the lungs every 6 (six) hours as needed for wheezing or shortness of breath.   08/26/2018 at Unknown time  . amLODipine (NORVASC) 10 MG tablet Take 1 tablet (10 mg total) by mouth daily. (Patient taking differently: Take 5 mg by mouth daily. )   08/26/2018 at Unknown time  .  aspirin 81 MG tablet Take 81 mg by mouth daily.   08/26/2018 at Unknown time  . carboxymethylcellulose (REFRESH PLUS) 0.5 % SOLN Place 1 drop into both eyes daily as needed (dry eyes).   08/26/2018 at Unknown time  . darifenacin (ENABLEX) 7.5 MG 24 hr tablet Take 7.5 mg by mouth daily.   08/26/2018 at Unknown time  . famotidine (PEPCID) 20 MG tablet Take 20 mg by mouth at bedtime.   08/22/2018  . Ferrous Gluconate (IRON) 240 (27 FE) MG TABS Take 1 tablet by mouth daily.   08/26/2018 at Unknown time  . folic acid (FOLVITE) 280 MCG tablet Take 400 mcg by mouth daily.   08/26/2018 at Unknown time  . furosemide (LASIX) 40 MG tablet Take 0.5 tablets (20 mg total) by mouth daily. 30 tablet 0 08/26/2018 at Unknown time  . ibuprofen (ADVIL,MOTRIN) 200 MG tablet Take 200 mg by mouth every 8 (eight) hours as needed for moderate pain.   08/26/2018 at Unknown time  . JANUVIA 50 MG tablet Take 50 mg by mouth daily.   5 08/26/2018 at Unknown time  . loratadine (CLARITIN) 10 MG tablet Take 10 mg by mouth daily.   08/26/2018 at Unknown time  . Magnesium 250 MG TABS Take 1 tablet by mouth daily.   08/26/2018 at Unknown time  . Omega-3 Fatty Acids (FISH OIL) 1000 MG CAPS Take 1 capsule by mouth daily.   08/26/2018 at Unknown time  . pantoprazole (PROTONIX) 40 MG tablet Take 40 mg by mouth daily.   08/26/2018  at Unknown time  . polyethylene glycol (MIRALAX / GLYCOLAX) packet Take 17 g by mouth daily as needed for mild constipation.   08/26/2018 at Unknown time  . potassium chloride (K-DUR,KLOR-CON) 10 MEQ tablet Take 10 mEq by mouth daily.    08/26/2018 at Unknown time  . vitamin B-12 (CYANOCOBALAMIN) 500 MCG tablet Take 1,000 mcg by mouth daily.    08/26/2018 at Unknown time  . VITAMIN D, CHOLECALCIFEROL, PO Take 5,000 Units by mouth daily.   08/26/2018 at Unknown time    Past Medical History: Past Medical History:  Diagnosis Date  . Arthritis    "qwhere" (07/04/2018)  . Chronic back pain    "all over" (07/04/2018)  . Chronic  diastolic CHF (congestive heart failure) ([place])   . Complication of anesthesia    "I have a hard time waking up"  . Degenerative joint disease of shoulder region   . Family history of adverse reaction to anesthesia    "daughter has the shakes when she wakes up"  . GERD (gastroesophageal reflux disease)   . Heart murmur   . High cholesterol   . History of hiatal hernia   . HOH (hard of hearing)   . Hypertension   . Osteoarthritis   . Phlebitis    "BLE"  . Type II diabetes mellitus ([place])      Past Surgical History: Past Surgical History:  Procedure Laterality Date  . ABDOMINAL HYSTERECTOMY    . BACK SURGERY    . CATARACT EXTRACTION W/ INTRAOCULAR LENS  IMPLANT, BILATERAL Bilateral   . ERCP N/A 08/30/2018   Procedure: ENDOSCOPIC RETROGRADE CHOLANGIOPANCREATOGRAPHY (ERCP);  Surgeon: Clarene Essex, MD;  Location: [facility];  Service: Endoscopy;  Laterality: N/A;  . FIXATION KYPHOPLASTY    . JOINT REPLACEMENT    . LAPAROSCOPIC CHOLECYSTECTOMY SINGLE SITE WITH INTRAOPERATIVE CHOLANGIOGRAM N/A 04/11/2015   Procedure: LAPAROSCOPIC LYSIS OF ADHESIONS, LAPAROSCOPIC CHOLECYSTECTOMY WITH INTRAOPERATIVE CHOLANGIOGRAM;  Surgeon: Michael Boston, MD;  Location: [facility];  Service: General;  Laterality: N/A;  . PANCREATIC STENT PLACEMENT  08/30/2018   Procedure: PANCREATIC STENT PLACEMENT;  Surgeon: Clarene Essex, MD;  Location: [facility];  Service: Endoscopy;;  . REMOVAL OF STONES  08/30/2018   Procedure: REMOVAL OF STONES;  Surgeon: Clarene Essex, MD;  Location: [facility];  Service: Endoscopy;;  . SHOULDER OPEN ROTATOR CUFF REPAIR Bilateral   . SPHINCTEROTOMY  08/30/2018   Procedure: SPHINCTEROTOMY;  Surgeon: Clarene Essex, MD;  Location: [facility];  Service: Endoscopy;;  . TOTAL KNEE ARTHROPLASTY Bilateral      Family History: Family History  Problem Relation Age of Onset  . Hypertension Mother   . Diabetes Mellitus I Mother   . Hypertension Father     Social History: Social History    Tobacco Use  . Smoking status: Never Smoker  . Smokeless tobacco: Never Used  Substance Use Topics  . Alcohol use: No    Comment: rare     Physical Exam: General: alert and oriented HENT:Head: Normocephalic, no lesions, without obvious abnormality. Neck:supple Musculoskeletal: Full grip strength in right hand. Distal radial pulse 2+. Normal range of motion of right wrist. Pain in the right shoulder with attempted flexion of the elbow. No erythema, streaking, or swelling noted about the right shoulder. Pain with attempted active and passive ROM, exam limited due to pain.    LABS: Results for orders placed or performed during the hospital encounter of 08/26/18  Comprehensive metabolic panel  Result Value Ref Range   Sodium 140 135 - 145  mmol/L   Potassium 3.9 3.5 - 5.1 mmol/L   Chloride 101 98 - 111 mmol/L   CO2 27 22 - 32 mmol/L   Glucose, Bld 161 (H) 70 - 99 mg/dL   BUN 14 8 - 23 mg/dL   Creatinine, Ser 0.83 0.44 - 1.00 mg/dL   Calcium 9.5 8.9 - 10.3 mg/dL   Total Protein 7.3 6.5 - 8.1 g/dL   Albumin 3.3 (L) 3.5 - 5.0 g/dL   AST 591 (H) 15 - 41 U/L   ALT 297 (H) 0 - 44 U/L   Alkaline Phosphatase 583 (H) 38 - 126 U/L   Total Bilirubin 2.3 (H) 0.3 - 1.2 mg/dL   GFR calc non Af Amer 60 (L) >60 mL/min   GFR calc Af Amer >60 >60 mL/min   Anion gap 12 5 - 15  CBC with Differential/Platelet  Result Value Ref Range   WBC 8.3 4.0 - 10.5 K/uL   RBC 4.69 3.87 - 5.11 MIL/uL   Hemoglobin 13.9 12.0 - 15.0 g/dL   HCT 45.7 36.0 - 46.0 %   MCV 97.4 80.0 - 100.0 fL   MCH 29.6 26.0 - 34.0 pg   MCHC 30.4 30.0 - 36.0 g/dL   RDW 13.8 11.5 - 15.5 %   Platelets 121 (L) 150 - 400 K/uL   nRBC 0.0 0.0 - 0.2 %   Neutrophils Relative % 94 %   Neutro Abs 7.8 (H) 1.7 - 7.7 K/uL   Lymphocytes Relative 4 %   Lymphs Abs 0.3 (L) 0.7 - 4.0 K/uL   Monocytes Relative 2 %   Monocytes Absolute 0.2 0.1 - 1.0 K/uL   Eosinophils Relative 0 %   Eosinophils Absolute 0.0 0.0 - 0.5 K/uL   Basophils  Relative 0 %   Basophils Absolute 0.0 0.0 - 0.1 K/uL   Immature Granulocytes 0 %   Abs Immature Granulocytes 0.02 0.00 - 0.07 K/uL  Lipase, blood  Result Value Ref Range   Lipase 2,773 (H) 11 - 51 U/L  Glucose, capillary  Result Value Ref Range   Glucose-Capillary 172 (H) 70 - 99 mg/dL  Troponin I - Once  Result Value Ref Range   Troponin I 0.03 (HH) <0.03 ng/mL  Lipase, blood  Result Value Ref Range   Lipase 780 (H) 11 - 51 U/L  Basic metabolic panel  Result Value Ref Range   Sodium 136 135 - 145 mmol/L   Potassium 3.7 3.5 - 5.1 mmol/L   Chloride 102 98 - 111 mmol/L   CO2 25 22 - 32 mmol/L   Glucose, Bld 159 (H) 70 - 99 mg/dL   BUN 16 8 - 23 mg/dL   Creatinine, Ser 0.97 0.44 - 1.00 mg/dL   Calcium 8.8 (L) 8.9 - 10.3 mg/dL   GFR calc non Af Amer 50 (L) >60 mL/min   GFR calc Af Amer 58 (L) >60 mL/min   Anion gap 9 5 - 15  Hepatic function panel  Result Value Ref Range   Total Protein 6.1 (L) 6.5 - 8.1 g/dL   Albumin 2.9 (L) 3.5 - 5.0 g/dL   AST 406 (H) 15 - 41 U/L   ALT 313 (H) 0 - 44 U/L   Alkaline Phosphatase 473 (H) 38 - 126 U/L   Total Bilirubin 1.7 (H) 0.3 - 1.2 mg/dL   Bilirubin, Direct 0.5 (H) 0.0 - 0.2 mg/dL   Indirect Bilirubin 1.2 (H) 0.3 - 0.9 mg/dL  Glucose, capillary  Result Value Ref Range  Glucose-Capillary 151 (H) 70 - 99 mg/dL  Glucose, capillary  Result Value Ref Range   Glucose-Capillary 102 (H) 70 - 99 mg/dL  Lipase, blood  Result Value Ref Range   Lipase 491 (H) 11 - 51 U/L  Glucose, capillary  Result Value Ref Range   Glucose-Capillary 96 70 - 99 mg/dL  CBC  Result Value Ref Range   WBC 12.6 (H) 4.0 - 10.5 K/uL   RBC 4.23 3.87 - 5.11 MIL/uL   Hemoglobin 12.6 12.0 - 15.0 g/dL   HCT 41.3 36.0 - 46.0 %   MCV 97.6 80.0 - 100.0 fL   MCH 29.8 26.0 - 34.0 pg   MCHC 30.5 30.0 - 36.0 g/dL   RDW 13.9 11.5 - 15.5 %   Platelets 103 (L) 150 - 400 K/uL   nRBC 0.0 0.0 - 0.2 %  Comprehensive metabolic panel  Result Value Ref Range   Sodium 140  135 - 145 mmol/L   Potassium 3.6 3.5 - 5.1 mmol/L   Chloride 103 98 - 111 mmol/L   CO2 25 22 - 32 mmol/L   Glucose, Bld 89 70 - 99 mg/dL   BUN 12 8 - 23 mg/dL   Creatinine, Ser 0.71 0.44 - 1.00 mg/dL   Calcium 9.1 8.9 - 10.3 mg/dL   Total Protein 5.9 (L) 6.5 - 8.1 g/dL   Albumin 2.4 (L) 3.5 - 5.0 g/dL   AST 150 (H) 15 - 41 U/L   ALT 195 (H) 0 - 44 U/L   Alkaline Phosphatase 373 (H) 38 - 126 U/L   Total Bilirubin 1.3 (H) 0.3 - 1.2 mg/dL   GFR calc non Af Amer >60 >60 mL/min   GFR calc Af Amer >60 >60 mL/min   Anion gap 12 5 - 15  Glucose, capillary  Result Value Ref Range   Glucose-Capillary 83 70 - 99 mg/dL  Glucose, capillary  Result Value Ref Range   Glucose-Capillary 83 70 - 99 mg/dL  Glucose, capillary  Result Value Ref Range   Glucose-Capillary 82 70 - 99 mg/dL  Glucose, capillary  Result Value Ref Range   Glucose-Capillary 80 70 - 99 mg/dL  Lipase, blood  Result Value Ref Range   Lipase 215 (H) 11 - 51 U/L  Glucose, capillary  Result Value Ref Range   Glucose-Capillary 128 (H) 70 - 99 mg/dL  Glucose, capillary  Result Value Ref Range   Glucose-Capillary 130 (H) 70 - 99 mg/dL  Comprehensive metabolic panel  Result Value Ref Range   Sodium 136 135 - 145 mmol/L   Potassium 3.5 3.5 - 5.1 mmol/L   Chloride 101 98 - 111 mmol/L   CO2 28 22 - 32 mmol/L   Glucose, Bld 100 (H) 70 - 99 mg/dL   BUN 12 8 - 23 mg/dL   Creatinine, Ser 0.63 0.44 - 1.00 mg/dL   Calcium 8.8 (L) 8.9 - 10.3 mg/dL   Total Protein 5.5 (L) 6.5 - 8.1 g/dL   Albumin 2.2 (L) 3.5 - 5.0 g/dL   AST 55 (H) 15 - 41 U/L   ALT 117 (H) 0 - 44 U/L   Alkaline Phosphatase 297 (H) 38 - 126 U/L   Total Bilirubin 1.2 0.3 - 1.2 mg/dL   GFR calc non Af Amer >60 >60 mL/min   GFR calc Af Amer >60 >60 mL/min   Anion gap 7 5 - 15  CBC with Differential/Platelet  Result Value Ref Range   WBC 11.3 (H) 4.0 - 10.5 K/uL  RBC 4.03 3.87 - 5.11 MIL/uL   Hemoglobin 12.0 12.0 - 15.0 g/dL   HCT 39.1 36.0 - 46.0 %   MCV  97.0 80.0 - 100.0 fL   MCH 29.8 26.0 - 34.0 pg   MCHC 30.7 30.0 - 36.0 g/dL   RDW 13.6 11.5 - 15.5 %   Platelets 103 (L) 150 - 400 K/uL   nRBC 0.0 0.0 - 0.2 %   Neutrophils Relative % 74 %   Neutro Abs 8.4 (H) 1.7 - 7.7 K/uL   Lymphocytes Relative 10 %   Lymphs Abs 1.1 0.7 - 4.0 K/uL   Monocytes Relative 13 %   Monocytes Absolute 1.4 (H) 0.1 - 1.0 K/uL   Eosinophils Relative 2 %   Eosinophils Absolute 0.2 0.0 - 0.5 K/uL   Basophils Relative 0 %   Basophils Absolute 0.0 0.0 - 0.1 K/uL   Immature Granulocytes 1 %   Abs Immature Granulocytes 0.08 (H) 0.00 - 0.07 K/uL  Glucose, capillary  Result Value Ref Range   Glucose-Capillary 148 (H) 70 - 99 mg/dL  Glucose, capillary  Result Value Ref Range   Glucose-Capillary 98 70 - 99 mg/dL  Glucose, capillary  Result Value Ref Range   Glucose-Capillary 95 70 - 99 mg/dL   Comment 1 Notify RN   Glucose, capillary  Result Value Ref Range   Glucose-Capillary 87 70 - 99 mg/dL  Glucose, capillary  Result Value Ref Range   Glucose-Capillary 265 (H) 70 - 99 mg/dL  Glucose, capillary  Result Value Ref Range   Glucose-Capillary 180 (H) 70 - 99 mg/dL  CBC with Differential/Platelet  Result Value Ref Range   WBC 10.9 (H) 4.0 - 10.5 K/uL   RBC 4.19 3.87 - 5.11 MIL/uL   Hemoglobin 12.4 12.0 - 15.0 g/dL   HCT 39.5 36.0 - 46.0 %   MCV 94.3 80.0 - 100.0 fL   MCH 29.6 26.0 - 34.0 pg   MCHC 31.4 30.0 - 36.0 g/dL   RDW 13.4 11.5 - 15.5 %   Platelets 117 (L) 150 - 400 K/uL   nRBC 0.0 0.0 - 0.2 %   Neutrophils Relative % 69 %   Neutro Abs 7.6 1.7 - 7.7 K/uL   Lymphocytes Relative 12 %   Lymphs Abs 1.3 0.7 - 4.0 K/uL   Monocytes Relative 16 %   Monocytes Absolute 1.7 (H) 0.1 - 1.0 K/uL   Eosinophils Relative 2 %   Eosinophils Absolute 0.2 0.0 - 0.5 K/uL   Basophils Relative 0 %   Basophils Absolute 0.0 0.0 - 0.1 K/uL   Immature Granulocytes 1 %   Abs Immature Granulocytes 0.06 0.00 - 0.07 K/uL  Comprehensive metabolic panel  Result Value  Ref Range   Sodium 135 135 - 145 mmol/L   Potassium 3.4 (L) 3.5 - 5.1 mmol/L   Chloride 98 98 - 111 mmol/L   CO2 29 22 - 32 mmol/L   Glucose, Bld 121 (H) 70 - 99 mg/dL   BUN 8 8 - 23 mg/dL   Creatinine, Ser 0.62 0.44 - 1.00 mg/dL   Calcium 8.7 (L) 8.9 - 10.3 mg/dL   Total Protein 5.7 (L) 6.5 - 8.1 g/dL   Albumin 2.3 (L) 3.5 - 5.0 g/dL   AST 36 15 - 41 U/L   ALT 86 (H) 0 - 44 U/L   Alkaline Phosphatase 275 (H) 38 - 126 U/L   Total Bilirubin 1.2 0.3 - 1.2 mg/dL   GFR calc non Af Amer >60 >60  mL/min   GFR calc Af Amer >60 >60 mL/min   Anion gap 8 5 - 15  Magnesium  Result Value Ref Range   Magnesium 1.7 1.7 - 2.4 mg/dL  Glucose, capillary  Result Value Ref Range   Glucose-Capillary 139 (H) 70 - 99 mg/dL  Glucose, capillary  Result Value Ref Range   Glucose-Capillary 127 (H) 70 - 99 mg/dL  Glucose, capillary  Result Value Ref Range   Glucose-Capillary 110 (H) 70 - 99 mg/dL  Glucose, capillary  Result Value Ref Range   Glucose-Capillary 115 (H) 70 - 99 mg/dL  Glucose, capillary  Result Value Ref Range   Glucose-Capillary 172 (H) 70 - 99 mg/dL  Glucose, capillary  Result Value Ref Range   Glucose-Capillary 229 (H) 70 - 99 mg/dL  CBC with Differential/Platelet  Result Value Ref Range   WBC 9.5 4.0 - 10.5 K/uL   RBC 4.30 3.87 - 5.11 MIL/uL   Hemoglobin 12.8 12.0 - 15.0 g/dL   HCT 41.8 36.0 - 46.0 %   MCV 97.2 80.0 - 100.0 fL   MCH 29.8 26.0 - 34.0 pg   MCHC 30.6 30.0 - 36.0 g/dL   RDW 13.5 11.5 - 15.5 %   Platelets 133 (L) 150 - 400 K/uL   nRBC 0.0 0.0 - 0.2 %   Neutrophils Relative % 80 %   Neutro Abs 7.6 1.7 - 7.7 K/uL   Lymphocytes Relative 11 %   Lymphs Abs 1.0 0.7 - 4.0 K/uL   Monocytes Relative 8 %   Monocytes Absolute 0.8 0.1 - 1.0 K/uL   Eosinophils Relative 0 %   Eosinophils Absolute 0.0 0.0 - 0.5 K/uL   Basophils Relative 0 %   Basophils Absolute 0.0 0.0 - 0.1 K/uL   Immature Granulocytes 1 %   Abs Immature Granulocytes 0.08 (H) 0.00 - 0.07 K/uL    Comprehensive metabolic panel  Result Value Ref Range   Sodium 138 135 - 145 mmol/L   Potassium 3.9 3.5 - 5.1 mmol/L   Chloride 99 98 - 111 mmol/L   CO2 28 22 - 32 mmol/L   Glucose, Bld 182 (H) 70 - 99 mg/dL   BUN 10 8 - 23 mg/dL   Creatinine, Ser 0.52 0.44 - 1.00 mg/dL   Calcium 8.8 (L) 8.9 - 10.3 mg/dL   Total Protein 5.9 (L) 6.5 - 8.1 g/dL   Albumin 2.1 (L) 3.5 - 5.0 g/dL   AST 23 15 - 41 U/L   ALT 67 (H) 0 - 44 U/L   Alkaline Phosphatase 263 (H) 38 - 126 U/L   Total Bilirubin 0.5 0.3 - 1.2 mg/dL   GFR calc non Af Amer >60 >60 mL/min   GFR calc Af Amer >60 >60 mL/min   Anion gap 11 5 - 15  Glucose, capillary  Result Value Ref Range   Glucose-Capillary 244 (H) 70 - 99 mg/dL  Glucose, capillary  Result Value Ref Range   Glucose-Capillary 193 (H) 70 - 99 mg/dL  Glucose, capillary  Result Value Ref Range   Glucose-Capillary 157 (H) 70 - 99 mg/dL  Glucose, capillary  Result Value Ref Range   Glucose-Capillary 123 (H) 70 - 99 mg/dL  Glucose, capillary  Result Value Ref Range   Glucose-Capillary 176 (H) 70 - 99 mg/dL  Glucose, capillary  Result Value Ref Range   Glucose-Capillary 145 (H) 70 - 99 mg/dL  Comprehensive metabolic panel  Result Value Ref Range   Sodium 138 135 - 145 mmol/L  Potassium 3.4 (L) 3.5 - 5.1 mmol/L   Chloride 99 98 - 111 mmol/L   CO2 30 22 - 32 mmol/L   Glucose, Bld 117 (H) 70 - 99 mg/dL   BUN 11 8 - 23 mg/dL   Creatinine, Ser 0.67 0.44 - 1.00 mg/dL   Calcium 8.7 (L) 8.9 - 10.3 mg/dL   Total Protein 5.6 (L) 6.5 - 8.1 g/dL   Albumin 2.0 (L) 3.5 - 5.0 g/dL   AST 18 15 - 41 U/L   ALT 50 (H) 0 - 44 U/L   Alkaline Phosphatase 234 (H) 38 - 126 U/L   Total Bilirubin 0.7 0.3 - 1.2 mg/dL   GFR calc non Af Amer >60 >60 mL/min   GFR calc Af Amer >60 >60 mL/min   Anion gap 9 5 - 15  Glucose, capillary  Result Value Ref Range   Glucose-Capillary 161 (H) 70 - 99 mg/dL  Glucose, capillary  Result Value Ref Range   Glucose-Capillary 127 (H) 70 - 99  mg/dL  Glucose, capillary  Result Value Ref Range   Glucose-Capillary 98 70 - 99 mg/dL  Glucose, capillary  Result Value Ref Range   Glucose-Capillary 115 (H) 70 - 99 mg/dL  Glucose, capillary  Result Value Ref Range   Glucose-Capillary 135 (H) 70 - 99 mg/dL  Glucose, capillary  Result Value Ref Range   Glucose-Capillary 219 (H) 70 - 99 mg/dL  Glucose, capillary  Result Value Ref Range   Glucose-Capillary 216 (H) 70 - 99 mg/dL  Glucose, capillary  Result Value Ref Range   Glucose-Capillary 122 (H) 70 - 99 mg/dL  Glucose, capillary  Result Value Ref Range   Glucose-Capillary 146 (H) 70 - 99 mg/dL  Glucose, capillary  Result Value Ref Range   Glucose-Capillary 115 (H) 70 - 99 mg/dL  Glucose, capillary  Result Value Ref Range   Glucose-Capillary 215 (H) 70 - 99 mg/dL  Glucose, capillary  Result Value Ref Range   Glucose-Capillary 209 (H) 70 - 99 mg/dL   Recent Labs    08/31/18 0422  HGB 12.8   Recent Labs    08/31/18 0422  WBC 9.5  RBC 4.30  HCT 41.8  PLT 133*   Recent Labs    08/31/18 0422 09/01/18 0440  NA 138 138  K 3.9 3.4*  CL 99 99  CO2 28 30  BUN 10 11  CREATININE 0.52 0.67  GLUCOSE 182* 117*  CALCIUM 8.8* 8.7*    Assessment/Plan:  Right shoulder pain s/p hemiarthroplasty   Patient is scheduled for an MRI tonight, will await results. Keep in sling for comfort. Will require follow-up with Dr. Veverly Fells on an outpatient basis once discharged.  Theresa Duty, PA-C Orthopedic Surgery [facility] [facility]  I have seen and examined the patient and agree with the above assessment, findings and plan. I feel that she has probably torn any remainder of her rotator cuff and the prosthesis is now articulating on the acromium. The MRI will rule out associated stress fracture or fluid collection. If no fracture or fluid collection may possibly do a cortisone injection to help with pain. Plan pending MRI results

## 2018-09-02 NOTE — Progress Notes (Signed)
[facility] PROGRESS NOTE  Heidi Dominguez UVO:[id number] DOB: 10/04/1920 DOA: 08/26/2018 PCP: Lajean Manes, MD   Narrative: 85 year old female HTN HLD DM TY 2 -Recent admission 06/2018 for nonspecific elevation LFTs with intra-and extrahepatic biliary dilatation-at that time she was placed on Keflex for what appeared to be E. coli bacteremia and took it for 7 days -She had a lap chole 03/2015 after gallstone pancreatitis  Return to the emergency room 1/12 with gallstone pancreatitis-AST ALT 460/260 bilirubin normal lipase 3080-GI consulted and an ultrasound was done did not show stones lipase went down to 95 alkaline phosphate to 381 AST to 102 and ALT to 146 and she was advised to heart healthy from clear liquid diet and discharged on 1/14 she returned with severe sudden onset abdominal pain in the epigastric region radiating to the back with nausea vomiting without diarrhea Lipase was found to be 2773 up from 95  Underwent EUS and then sphincterotomy this admission with resolution hospitalization complicated by increasing right shoulder pain with inability to move x-rays done MRI is pending at this time Will require skilled level care   A & Plan ? Gallstone/sludge pancreatitis ERCP as below with sphincterotomy-GI grad to reg diet x-ray in 1 week to see if plastic stent clears   IV Zosyn to p.o. Augmentin, complete on 1/21  [5  Days]-overall benign R shoudler pain-  Likely rotator cuff related-- x-ray shows prior hemiarthroplasty-MRI shoulder is pending-Ortho Dr. Geanie Cooley for input 1/20 Place in sling for comfort HTN- resumed amlodipine 10 mg daily-HCTZ 6.25 added-improved Mild thrombocytopenia, mild leukocytosis Stable to improved at this time Diabetes mellitus type 2 on orals -can resume Januvia 50 daily on discharge Sugars 115-215--monitor trends Chronic diastolic heart failure EF preserved 03/2015 Net +, d/c saline now on HCTZ Stage II CKD -stable at this time  improved compared to prior-recheck labs in a.m.  Reflux resume on Pepcid on discharge Constipation continue MiraLAX Urge incontinence continue Enablex 7.5 daily Mild overweight BMI 28  SCD, inpatient, discussed with son at the bedside Await MRI of shoulder and ortho input Will require SNF when able to coordinate  Verlon Au, MD  [facility] Direct contact: [phone] --Via amion app OR  --[email or web address]; password [email or web address]  7PM-7AM contact night coverage as above 09/02/2018, 4:02 PM  LOS: 5 days   Consultants:  Gastroenterology  Interval history/Subjective:  No new issue Feels fair Still shoulder pain Daughter asking about ortho consult   Objective:  Vitals:  Vitals:   09/02/18 1224 09/02/18 1418  BP:  (!) 152/66  Pulse:  93  Resp:  (!) 28  Temp:  99.3 F (37.4 C)  SpO2: 94% 98%    Exam:  Alert pleasant oriented no distress eomi no ic tno pallor R UE in sling cta b Some mild tenderness   I have personally reviewed the following:  DATA   Labs:     Imaging studies: MRCP 5/64 Uncomplicated acute pancreatitis.  Status post cholecystectomy. Stable mild intrahepatic and moderate extrahepatic ductal dilatation. No choledocholithiasis is seen.  Small bilateral pleural effusions.  ERCP 1/17  Impression:               - The major papilla appeared norma initially until                            injection as documented above l.                           -  Choledocholithiasis was found. Complete removal                            was accomplished by biliary sphincterotomy and                            balloon extraction.                           - One plastic stent was placed into the ventral                            pancreatic duct.                           - A biliary sphincterotomy was performed initially                            using the needle-knife technique.                           - A biliary sphincterotomy was performed  increasing                            the size of the sphincterotomy in the customary                            fashion- The biliary tree was swept.  Scheduled Meds: . amLODipine  5 mg Oral Daily  . amoxicillin-clavulanate  1 tablet Oral Q12H  . darifenacin  7.5 mg Oral Daily  . hydrochlorothiazide  6.25 mg Oral Daily  . insulin aspart  0-9 Units Subcutaneous Q4H  . magnesium oxide  400 mg Oral Daily  . pantoprazole  40 mg Oral Daily  . polyethylene glycol  17 g Oral Daily   Continuous Infusions:   Principal Problem:   Abdominal pain Active Problems:   Essential hypertension   Diabetes mellitus type 2, controlled (HCC)   Chronic diastolic CHF (congestive heart failure) (HCC)   Elevated LFTs   CKD (chronic kidney disease) stage 3, GFR 30-59 ml/min (HCC)   Acute pancreatitis   LOS: 5 days

## 2018-09-02 NOTE — Progress Notes (Signed)
Physical Therapy Treatment Patient Details Name: Heidi Dominguez MRN: [id number] DOB: 1920-11-19 Today's Date: 09/02/2018    History of Present Illness Patient is a 85 y/o female presenting to the ED on 08/26/18 with primary complaints of abdominal pain. Of note, recent hospital admission for recurrent pancreatitis suspected to be secondary to gallstone. PMH significant for CHF, hypertension, hyperlipidemia, COPD. ERCP on 08/30/18.    PT Comments    Pt assisted with standing x3.  Pt requiring increased assist so utilized stedy so safely transfer to recliner.  Continue to recommend SNF upon d/c.  Follow Up Recommendations  SNF;Supervision/Assistance - 24 hour     Equipment Recommendations  Wheelchair cushion (measurements PT);Wheelchair (measurements PT)(if home)    Recommendations for Other Services       Precautions / Restrictions Precautions Precautions: Fall Required Braces or Orthoses: Sling Restrictions Weight Bearing Restrictions: No    Mobility  Bed Mobility Overal bed mobility: Needs Assistance Bed Mobility: Supine to Sit     Supine to sit: Max assist;+2 for physical assistance     General bed mobility comments: pt initiated movement however required assist for trunk upright and scooting to EOB, utilized bed pad  Transfers Overall transfer level: Needs assistance Equipment used: Rolling walker (2 wheeled) Transfers: Sit to/from [facility] Sit to Stand: Mod assist;Max assist         General transfer comment: attempted with RW however pt unable to move LEs for COG, utilized Stedy to safely assist to standing, performed standing twice to use bed pad (while sitting EOB)  Ambulation/Gait                 Stairs             Wheelchair Mobility    Modified Rankin (Stroke Patients Only)       Balance Overall balance assessment: Needs assistance Sitting-balance support: Single extremity supported;Feet supported Sitting  balance-Leahy Scale: Poor   Postural control: Posterior lean                                  Cognition Arousal/Alertness: Awake/alert Behavior During Therapy: WFL for tasks assessed/performed Overall Cognitive Status: Within Functional Limits for tasks assessed                                        Exercises General Exercises - Lower Extremity Ankle Circles/Pumps: AROM;Seated;Both;10 reps Short Arc Quad: AROM;10 reps;Seated;Both Hip Flexion/Marching: AROM;10 reps;Seated;Both    General Comments        Pertinent Vitals/Pain Pain Assessment: Faces Faces Pain Scale: Hurts little more Pain Location: R shoulder with movement - even maintained in sling Pain Descriptors / Indicators: Aching;Discomfort;Grimacing Pain Intervention(s): Limited activity within patient's tolerance;Monitored during session;Repositioned    Home Living                      Prior Function            PT Goals (current goals can now be found in the care plan section) Progress towards PT goals: Progressing toward goals    Frequency    Min 2X/week      PT Plan Current plan remains appropriate    Co-evaluation              AM-PAC PT "6 Clicks" Mobility   Outcome Measure  Help needed turning from your back to your side while in a flat bed without using bedrails?: A Lot Help needed moving from lying on your back to sitting on the side of a flat bed without using bedrails?: A Lot Help needed moving to and from a bed to a chair (including a wheelchair)?: Total Help needed standing up from a chair using your arms (e.g., wheelchair or bedside chair)?: Total Help needed to walk in hospital room?: Total Help needed climbing 3-5 steps with a railing? : Total 6 Click Score: 8    End of Session Equipment Utilized During Treatment: Gait belt Activity Tolerance: Patient tolerated treatment well Patient left: in chair;with call bell/phone within reach;with  chair alarm set;with family/visitor present Nurse Communication: Mobility status;Need for lift equipment PT Visit Diagnosis: Unsteadiness on feet (R26.81);Other abnormalities of gait and mobility (R26.89);Muscle weakness (generalized) (M62.81)     Time: 0175-1025 PT Time Calculation (min) (ACUTE ONLY): 16 min  Charges:  $Therapeutic Activity: 8-22 mins                     [name], PT, DPT Acute Rehabilitation Services Office: [phone] Pager: [phone]  [name] 09/02/2018, 1:12 PM

## 2018-09-03 DIAGNOSIS — D519 Vitamin B12 deficiency anemia, unspecified: Secondary | ICD-10-CM | POA: Diagnosis not present

## 2018-09-03 DIAGNOSIS — M6281 Muscle weakness (generalized): Secondary | ICD-10-CM | POA: Diagnosis not present

## 2018-09-03 DIAGNOSIS — R2689 Other abnormalities of gait and mobility: Secondary | ICD-10-CM | POA: Diagnosis not present

## 2018-09-03 DIAGNOSIS — M25519 Pain in unspecified shoulder: Secondary | ICD-10-CM | POA: Diagnosis not present

## 2018-09-03 DIAGNOSIS — R52 Pain, unspecified: Secondary | ICD-10-CM | POA: Diagnosis not present

## 2018-09-03 DIAGNOSIS — N3281 Overactive bladder: Secondary | ICD-10-CM | POA: Diagnosis not present

## 2018-09-03 DIAGNOSIS — Z741 Need for assistance with personal care: Secondary | ICD-10-CM | POA: Diagnosis not present

## 2018-09-03 DIAGNOSIS — R109 Unspecified abdominal pain: Secondary | ICD-10-CM | POA: Diagnosis not present

## 2018-09-03 DIAGNOSIS — M2550 Pain in unspecified joint: Secondary | ICD-10-CM | POA: Diagnosis not present

## 2018-09-03 DIAGNOSIS — E559 Vitamin D deficiency, unspecified: Secondary | ICD-10-CM | POA: Diagnosis not present

## 2018-09-03 DIAGNOSIS — M25511 Pain in right shoulder: Secondary | ICD-10-CM | POA: Diagnosis not present

## 2018-09-03 DIAGNOSIS — E119 Type 2 diabetes mellitus without complications: Secondary | ICD-10-CM | POA: Diagnosis not present

## 2018-09-03 DIAGNOSIS — R945 Abnormal results of liver function studies: Secondary | ICD-10-CM | POA: Diagnosis not present

## 2018-09-03 DIAGNOSIS — I5032 Chronic diastolic (congestive) heart failure: Secondary | ICD-10-CM | POA: Diagnosis not present

## 2018-09-03 DIAGNOSIS — M75101 Unspecified rotator cuff tear or rupture of right shoulder, not specified as traumatic: Secondary | ICD-10-CM | POA: Diagnosis not present

## 2018-09-03 DIAGNOSIS — T8484XA Pain due to internal orthopedic prosthetic devices, implants and grafts, initial encounter: Secondary | ICD-10-CM | POA: Diagnosis not present

## 2018-09-03 DIAGNOSIS — K851 Biliary acute pancreatitis without necrosis or infection: Secondary | ICD-10-CM | POA: Diagnosis not present

## 2018-09-03 DIAGNOSIS — R1312 Dysphagia, oropharyngeal phase: Secondary | ICD-10-CM | POA: Diagnosis not present

## 2018-09-03 DIAGNOSIS — D529 Folate deficiency anemia, unspecified: Secondary | ICD-10-CM | POA: Diagnosis not present

## 2018-09-03 DIAGNOSIS — R0902 Hypoxemia: Secondary | ICD-10-CM | POA: Diagnosis not present

## 2018-09-03 DIAGNOSIS — Z7401 Bed confinement status: Secondary | ICD-10-CM | POA: Diagnosis not present

## 2018-09-03 DIAGNOSIS — K861 Other chronic pancreatitis: Secondary | ICD-10-CM | POA: Diagnosis not present

## 2018-09-03 LAB — GLUCOSE, CAPILLARY
GLUCOSE-CAPILLARY: 145 mg/dL — AB (ref 70–99)
Glucose-Capillary: 268 mg/dL — ABNORMAL HIGH (ref 70–99)
Glucose-Capillary: 185 mg/dL — ABNORMAL HIGH (ref 70–99)

## 2018-09-03 MED ORDER — INSULIN ASPART 100 UNIT/ML ~~LOC~~ SOLN
0.0000 [IU] | Freq: Three times a day (TID) | SUBCUTANEOUS | Status: DC
  Administered 2018-09-03: 2 [IU] via SUBCUTANEOUS
  Administered 2018-09-03: 5 [IU] via SUBCUTANEOUS
  Administered 2018-09-03: 1 [IU] via SUBCUTANEOUS

## 2018-09-03 MED ORDER — ACETAMINOPHEN 500 MG PO TABS: 500.0000 mg | ORAL_TABLET | Freq: Four times a day (QID) | ORAL | 0 refills | Status: DC | PRN

## 2018-09-03 MED ORDER — AMLODIPINE BESYLATE 5 MG PO TABS: 5.0000 mg | ORAL_TABLET | Freq: Every day | ORAL | Status: DC

## 2018-09-03 MED ORDER — HYDROCHLOROTHIAZIDE 10 MG/ML ORAL SUSPENSION: 6.2500 mg | Freq: Every day | ORAL | Status: DC

## 2018-09-03 MED ORDER — LIDOCAINE HCL 2 % IJ SOLN
2.0000 mL | Freq: Once | INTRAMUSCULAR | Status: DC
  Filled 2018-09-03: qty 20

## 2018-09-03 MED ORDER — METHYLPREDNISOLONE ACETATE 80 MG/ML IJ SUSP
80.0000 mg | Freq: Once | INTRAMUSCULAR | Status: AC
  Administered 2018-09-03: 80 mg via INTRA_ARTICULAR
  Filled 2018-09-03: qty 1

## 2018-09-03 MED ORDER — MAGNESIUM OXIDE 400 (241.3 MG) MG PO TABS: 400.0000 mg | ORAL_TABLET | Freq: Every day | ORAL | Status: DC

## 2018-09-03 NOTE — Progress Notes (Signed)
Report called to Timmothy Sours, RN at [facility].  All questions answered.  PTAR set up, daughter aware.

## 2018-09-03 NOTE — Discharge Summary (Signed)
Physician Discharge Summary  Heidi Dominguez XBM:[id number] DOB: 11-06-1920 DOA: 08/26/2018  PCP: Lajean Manes, MD  Admit date: 08/26/2018 Discharge date: 09/03/2018  Time spent: 35 minutes  Recommendations for Outpatient Follow-up:  1. Will need semi-intensive therapy to the right shoulder to help with probable rotator cuff tear and orthopedics Dr. Cleophus Molt should see her in 2 to 3 weeks she should be maintained in a sling with swath for comfort use Tylenol for pain control 2. Completed antibiotics this admission for gallstone pancreatitis 3. Repeat Chem-12 and magnesium in 3 to 4 days and supplement as necessary needed potassium this admission as well 4. Dosage of amlodipine dropped from 10-5, added HCTZ, discontinue Lasix this admission  Discharge Diagnoses:  Principal Problem:   Abdominal pain Active Problems:   Essential hypertension   Diabetes mellitus type 2, controlled (HCC)   Chronic diastolic CHF (congestive heart failure) (HCC)   Elevated LFTs   CKD (chronic kidney disease) stage 3, GFR 30-59 ml/min (HCC)   Acute pancreatitis   Discharge Condition: Improved  Diet recommendation: Heart healthy  Filed Weights   09/01/18 0500 09/02/18 0520 09/03/18 0500  Weight: 82 kg 78.9 kg 78.9 kg    History of present illness:  85 year old female HTN HLD DM TY 2 -Recent admission 06/2018 for nonspecific elevation LFTs with intra-and extrahepatic biliary dilatation-at that time she was placed on Keflex for what appeared to be E. coli bacteremia and took it for 7 days -She had a lap chole 03/2015 after gallstone pancreatitis  Return to the emergency room 1/12 with gallstone pancreatitis-AST ALT 460/260 bilirubin normal lipase 3080-GI consulted and an ultrasound was done did not show stones lipase went down to 95 alkaline phosphate to 381 AST to 102 and ALT to 146 and she was advised to heart healthy from clear liquid diet and discharged on 1/14 she returned with severe sudden onset  abdominal pain in the epigastric region radiating to the back with nausea vomiting without diarrhea Lipase was found to be 2773 up from 95  Underwent EUS and then sphincterotomy this admission with resolution hospitalization complicated by increasing right shoulder pain with inability to move x-rays done MRI is pending at this time Will require skilled level care  Hospital Course:  ? Gallstone/sludge pancreatitis ERCP as below with sphincterotomy-GI grad to reg diet x-ray in 1 week to see if plastic stent clears             IV Zosyn to p.o. Augmentin, complete on 1/21             interval outpatient follow-up if needed with GI however can tolerate a regular diet now R shoudler pain-          Likely rotator cuff related-- x-ray shows prior hemiarthroplasty-MRI shoulder is pending-Ortho Dr. Geanie Cooley for input 1/20 Place in sling for comfort  Will be obtaining an injection prior to discharge and outpatient follow-up as dictated per orthopedics HTN- resumed at lower dose of 5 mg started on-HCTZ 6.25 added-improved Mild thrombocytopenia, mild leukocytosis Stable to improved at this time Diabetes mellitus type 2 on orals -can resume Januvia 50 daily on discharge Sugars were controlled while in the hospital below 200 range for the most part and was kept on sliding scale  Chronic diastolic heart failure EF preserved 03/2015 Net +, d/c saline now on HCTZ Stage II CKD -stable at this time--last labs do note clearing  Reflux resume on Pepcid on discharge Constipation continue MiraLAX Urge incontinence continue Enablex 7.5 daily Mild  overweight BMI 28  Procedures:  MRCP, ERCP, MRI shoulder  Consultations:  Gastroenterology  Orthopedics  Discharge Exam: Vitals:   09/03/18 0602 09/03/18 0850  BP: 101/65 (!) 169/64  Pulse: 76   Resp:    Temp: 99.1 F (37.3 C)   SpO2: 93%     General: Awake pleasant alert coherent pain is moderately controlled no distress Cardiovascular: S1-S2  no murmur rub or gallop telemetry is benign Respiratory: Clinically clear no added sound Right shoulder is in sling range of motion is painful No icterus no pallor Neck is soft supple  Discharge Instructions   Discharge Instructions    Diet - low sodium heart healthy   Complete by:  As directed    Increase activity slowly   Complete by:  As directed      Allergies as of 09/03/2018      Reactions   Catapres [clonidine Hcl] Other (See Comments)   Unknown. Unknown.   Codeine Other (See Comments)   "Makes me out of my head, loopy"   Covera-hs [verapamil Hcl] Other (See Comments)   Unknown.   Hydrocodone Other (See Comments)   "makes me go out of my head, loopy"   Other Other (See Comments)   Any type of narcotics Feels "loopy" Any type of narcotics Feels "loopy"   Sulfa Antibiotics Itching   Sulfamethoxazole Diarrhea   Verapamil Diarrhea, Other (See Comments)   Sulfasalazine Itching      Medication List    STOP taking these medications   carboxymethylcellulose 0.5 % Soln Commonly known as:  REFRESH PLUS   Fish Oil 1000 MG Caps   furosemide 40 MG tablet Commonly known as:  LASIX   ibuprofen 200 MG tablet Commonly known as:  ADVIL,MOTRIN   Magnesium 250 MG Tabs   potassium chloride 10 MEQ tablet Commonly known as:  K-DUR,KLOR-CON     TAKE these medications   acetaminophen 500 MG tablet Commonly known as:  TYLENOL Take 1 tablet (500 mg total) by mouth every 6 (six) hours as needed for mild pain or moderate pain.   albuterol 108 (90 Base) MCG/ACT inhaler Commonly known as:  PROVENTIL HFA;VENTOLIN HFA Inhale into the lungs every 6 (six) hours as needed for wheezing or shortness of breath.   amLODipine 5 MG tablet Commonly known as:  NORVASC Take 1 tablet (5 mg total) by mouth daily. Start taking on:  September 04, 2018 What changed:    medication strength  how much to take   aspirin 81 MG tablet Take 81 mg by mouth daily.   darifenacin 7.5 MG 24 hr  tablet Commonly known as:  ENABLEX Take 7.5 mg by mouth daily.   famotidine 20 MG tablet Commonly known as:  PEPCID Take 20 mg by mouth at bedtime.   folic acid 712 MCG tablet Commonly known as:  FOLVITE Take 400 mcg by mouth daily.   hydrochlorothiazide 10 mg/mL Susp Take 0.63 mLs (6.25 mg total) by mouth daily.   Iron 240 (27 Fe) MG Tabs Take 1 tablet by mouth daily.   JANUVIA 50 MG tablet Generic drug:  sitaGLIPtin Take 50 mg by mouth daily.   loratadine 10 MG tablet Commonly known as:  CLARITIN Take 10 mg by mouth daily.   magnesium oxide 400 (241.3 Mg) MG tablet Commonly known as:  MAG-OX Take 1 tablet (400 mg total) by mouth daily. Start taking on:  September 04, 2018   pantoprazole 40 MG tablet Commonly known as:  PROTONIX Take 40 mg by  mouth daily.   polyethylene glycol packet Commonly known as:  MIRALAX / GLYCOLAX Take 17 g by mouth daily as needed for mild constipation.   vitamin B-12 500 MCG tablet Commonly known as:  CYANOCOBALAMIN Take 1,000 mcg by mouth daily.   VITAMIN D (CHOLECALCIFEROL) PO Take 5,000 Units by mouth daily.      Allergies  Allergen Reactions  . Catapres [Clonidine Hcl] Other (See Comments)    Unknown. Unknown.  . Codeine Other (See Comments)    "Makes me out of my head, loopy"  . Covera-Hs [Verapamil Hcl] Other (See Comments)    Unknown.  Marland Kitchen Hydrocodone Other (See Comments)    "makes me go out of my head, loopy"  . Other Other (See Comments)    Any type of narcotics Feels "loopy" Any type of narcotics Feels "loopy"  . Sulfa Antibiotics Itching  . Sulfamethoxazole Diarrhea  . Verapamil Diarrhea and Other (See Comments)  . Sulfasalazine Itching      The results of significant diagnostics from this hospitalization (including imaging, microbiology, ancillary and laboratory) are listed below for reference.    Significant Diagnostic Studies: Dg Abdomen 1 View  Result Date: 08/23/2018 CLINICAL DATA:  85 y/o F;  generalized abdominal pain and constipation for 2 days. Nausea without vomiting. EXAM: ABDOMEN - 1 VIEW COMPARISON:  07/04/2018 CT abdomen and pelvis. FINDINGS: Normal bowel gas pattern. Right upper quadrant cholecystectomy clips. Chronic compression deformities of lower thoracic and upper lumbar spine, T12-L3. L3 kyphoplasty. IMPRESSION: Normal bowel gas pattern. Electronically Signed   By: Kristine Garbe M.D.   On: 08/23/2018 02:02   Ct Abdomen Pelvis W Contrast  Result Date: 08/23/2018 CLINICAL DATA:  85 y/o F; generalized abdominal pain and constipation for 2 days. Nausea and vomiting. EXAM: CT ABDOMEN AND PELVIS WITH CONTRAST TECHNIQUE: Multidetector CT imaging of the abdomen and pelvis was performed using the standard protocol following bolus administration of intravenous contrast. CONTRAST:  15mL OMNIPAQUE IOHEXOL 300 MG/ML  SOLN COMPARISON:  07/04/2018 CT abdomen and pelvis FINDINGS: Lower chest: Cardiomegaly. Large hiatal hernia. Coronary, aortic valvular, and mitral annular calcific atherosclerosis. Hepatobiliary: Cholecystectomy. Intra and extrahepatic biliary ductal dilatation with the common bile duct measuring up to 14 mm, stable from prior CT given differences in technique. No focal liver lesion identified. Pancreas: Unremarkable. No pancreatic ductal dilatation or surrounding inflammatory changes. Spleen: Normal in size without focal abnormality. Adrenals/Urinary Tract: Normal adrenal glands. Right kidney lower pole cyst measuring 23 mm. No urinary stone disease or hydronephrosis. Normal bladder. Stomach/Bowel: Stomach is within normal limits. Appendix not identified. No evidence of bowel wall thickening, distention, or inflammatory changes. Sigmoid diverticulosis without findings of acute diverticulitis. Vascular/Lymphatic: Aortic atherosclerosis. No enlarged abdominal or pelvic lymph nodes. Reproductive: Status post hysterectomy. No adnexal masses. Other: No abdominal wall hernia or  abnormality. No abdominopelvic ascites. Musculoskeletal: Lumbar levocurvature with apex at T12-L1. stable T12-L3 compression deformities with L3 augmentation. Stable severe spondylosis with multilevel vacuum phenomenon and prominent facet arthropathy. IMPRESSION: 1. No acute process identified. 2. Large hiatal hernia. 3. Stable extensive intra and extrahepatic biliary ductal dilatation post cholecystectomy, probably compensatory. 4. Sigmoid diverticulosis without findings of acute diverticulitis. 5. Coronary and aortic Atherosclerosis (ICD10-I70.0). Stable cardiomegaly. 6. Stable T12-L3 compression deformities, L3 augmentation, and advanced lumbar spondylosis. Electronically Signed   By: Kristine Garbe M.D.   On: 08/23/2018 03:37   Mr Shoulder Right Wo Contrast  Result Date: 09/02/2018 CLINICAL DATA:  Remote history of right shoulder replacement. Right shoulder pain without known injury. Question  rotator cuff tear or fracture. EXAM: MRI OF THE RIGHT SHOULDER WITHOUT CONTRAST TECHNIQUE: Multiplanar, multisequence MR imaging of the shoulder was performed. No intravenous contrast was administered. COMPARISON:  Plain films right shoulder 08/29/2018. FINDINGS: This study is limited by artifact from the patient's shoulder arthroplasty. Artifact reduction techniques were used on the study. Rotator cuff: Not visualized but the humeral head abuts the acromion consistent with chronic rotator cuff tear. Muscles: The supraspinatus, infraspinatus and subscapularis are completely fatty replaced. Biceps long head:  Not visualized due to artifact. Acromioclavicular Joint:  Not visible due to artifact. Glenohumeral Joint: Replaced. Labrum:  Not applicable Bones: No acute abnormality is seen. The distal end of the humeral component is not included on the exam. Other: None. IMPRESSION: Markedly limited study despite use of artifact reduction techniques. Findings consistent with chronic, complete supraspinatus,  infraspinatus and subscapularis tears. No acute bony abnormality. The entire humeral stem could not be included on the exam. Electronically Signed   By: Inge Rise M.D.   On: 09/02/2018 20:06   Mr 3d Recon At Scanner  Result Date: 08/27/2018 CLINICAL DATA:  Abnormal LFTs, pancreatitis suspected EXAM: MRI ABDOMEN WITHOUT AND WITH CONTRAST (INCLUDING MRCP) TECHNIQUE: Multiplanar multisequence MR imaging of the abdomen was performed both before and after the administration of intravenous contrast. Heavily T2-weighted images of the biliary and pancreatic ducts were obtained, and three-dimensional MRCP images were rendered by post processing. CONTRAST:  7 mL Gadovist IV COMPARISON:  CT abdomen/pelvis dated 08/23/2018. MRI abdomen dated 07/04/2018. FINDINGS: Lower chest: Small bilateral pleural effusions. Hepatobiliary: Liver is within normal limits. No suspicious/enhancing hepatic lesions. Status post cholecystectomy. Mild intrahepatic and moderate extrahepatic ductal dilatation, unchanged. Common duct measures 16 mm, unchanged. No choledocholithiasis is seen. Pancreas: Mild peripancreatic inflammatory changes, compatible with acute pancreatitis. No pancreatic necrosis or ductal dilatation. No peripancreatic fluid collection. Spleen:  Within normal limits. Adrenals/Urinary Tract:  Adrenal glands are within normal limits. 2.4 cm posterior interpolar right renal cyst (series 8/image 38). Left kidney is within normal limits. No hydronephrosis. Stomach/Bowel: Stomach is notable for a large hiatal hernia. Visualized bowel is grossly unremarkable. Vascular/Lymphatic:  No evidence of abdominal aortic aneurysm. No suspicious abdominal lymphadenopathy. Other:  No abdominal ascites Musculoskeletal: Degenerative changes of the lumbar spine. Susceptibility artifact related to prior vertebral augmentation at L3. IMPRESSION: Uncomplicated acute pancreatitis. Status post cholecystectomy. Stable mild intrahepatic and moderate  extrahepatic ductal dilatation. No choledocholithiasis is seen. Small bilateral pleural effusions. Electronically Signed   By: Julian Hy M.D.   On: 08/27/2018 19:56   Dg Shoulder Right Port  Result Date: 08/29/2018 CLINICAL DATA:  Right shoulder pain. EXAM: PORTABLE RIGHT SHOULDER COMPARISON:  Right shoulder radiographs 07/04/2018 FINDINGS: Right shoulder hemiarthroplasty is present. There is no significant interval change. The shoulder is located. Advanced degenerative changes are noted at the [facility] joint. The right hemithorax is clear. IMPRESSION: Right shoulder hemiarthroplasty.  No acute abnormality. Electronically Signed   By: San Morelle M.D.   On: 08/29/2018 10:54   Dg Ercp Biliary & Pancreatic Ducts  Result Date: 08/30/2018 CLINICAL DATA:  85 year old female undergoing ERCP EXAM: ERCP TECHNIQUE: Multiple spot images obtained with the fluoroscopic device and submitted for interpretation post-procedure. FLUOROSCOPY TIME:  Fluoroscopy Time:  10 minutes 3 seconds reported COMPARISON:  MRI, MRCP 08/27/2018 FINDINGS: A total of 14 intraoperative saved images are submitted for review. The images demonstrate a flexible endoscope in the descending duodenum. Wire cannulation of the main pancreatic duct is performed. Pancreatic duct Dr. Kingsley Plan demonstrates a mildly  dilated main pancreatic duct. A plastic biliary stent was placed. The images then demonstrate cannulation of the common bile duct. Cholangiography demonstrates a diffusely dilated common bile duct. IMPRESSION: ERCP with cholangiography and pancreatic ductography. Placement of a pancreatic duct stent. These images were submitted for radiologic interpretation only. Please see the procedural report for the amount of contrast and the fluoroscopy time utilized. Electronically Signed   By: Jacqulynn Cadet M.D.   On: 08/30/2018 09:32   Mr Abdomen Mrcp Moise Boring Contast  Result Date: 08/27/2018 CLINICAL DATA:  Abnormal LFTs, pancreatitis  suspected EXAM: MRI ABDOMEN WITHOUT AND WITH CONTRAST (INCLUDING MRCP) TECHNIQUE: Multiplanar multisequence MR imaging of the abdomen was performed both before and after the administration of intravenous contrast. Heavily T2-weighted images of the biliary and pancreatic ducts were obtained, and three-dimensional MRCP images were rendered by post processing. CONTRAST:  7 mL Gadovist IV COMPARISON:  CT abdomen/pelvis dated 08/23/2018. MRI abdomen dated 07/04/2018. FINDINGS: Lower chest: Small bilateral pleural effusions. Hepatobiliary: Liver is within normal limits. No suspicious/enhancing hepatic lesions. Status post cholecystectomy. Mild intrahepatic and moderate extrahepatic ductal dilatation, unchanged. Common duct measures 16 mm, unchanged. No choledocholithiasis is seen. Pancreas: Mild peripancreatic inflammatory changes, compatible with acute pancreatitis. No pancreatic necrosis or ductal dilatation. No peripancreatic fluid collection. Spleen:  Within normal limits. Adrenals/Urinary Tract:  Adrenal glands are within normal limits. 2.4 cm posterior interpolar right renal cyst (series 8/image 38). Left kidney is within normal limits. No hydronephrosis. Stomach/Bowel: Stomach is notable for a large hiatal hernia. Visualized bowel is grossly unremarkable. Vascular/Lymphatic:  No evidence of abdominal aortic aneurysm. No suspicious abdominal lymphadenopathy. Other:  No abdominal ascites Musculoskeletal: Degenerative changes of the lumbar spine. Susceptibility artifact related to prior vertebral augmentation at L3. IMPRESSION: Uncomplicated acute pancreatitis. Status post cholecystectomy. Stable mild intrahepatic and moderate extrahepatic ductal dilatation. No choledocholithiasis is seen. Small bilateral pleural effusions. Electronically Signed   By: Julian Hy M.D.   On: 08/27/2018 19:56   US Abdomen Limited Ruq  Result Date: 08/23/2018 CLINICAL DATA:  Elevated LFTs.  Cholecystectomy. EXAM: ULTRASOUND  ABDOMEN LIMITED RIGHT UPPER QUADRANT COMPARISON:  CT 08/23/2018, 09/03/2017.  MRI 07/04/2018. FINDINGS: Gallbladder: Cholecystectomy. Common bile duct: Diameter: 15.5 mm.Similar findings noted on prior CT. Intrahepatic biliary ductal dilatation also again noted. Similar findings noted on prior CT. Liver: No focal hepatic abnormalities identified. Echotexture normal. Portal vein is patent on color Doppler imaging with normal direction of blood flow towards the liver. IMPRESSION: 1. Cholecystectomy. Dilated common bile duct and intrahepatic biliary ductal system again noted. Similar findings noted on prior studies. 2. No focal hepatic abnormality identified. Electronically Signed   By: Marcello Moores  Register   On: 08/23/2018 13:05    Microbiology: No results found for this or any previous visit (from the past 240 hour(s)).   Labs: Basic Metabolic Panel: Recent Labs  Lab 08/28/18 0520 08/29/18 0441 08/30/18 0426 08/31/18 0422 09/01/18 0440  NA 140 136 135 138 138  K 3.6 3.5 3.4* 3.9 3.4*  CL 103 101 98 99 99  CO2 25 28 29 28 30   GLUCOSE 89 100* 121* 182* 117*  BUN 12 12 8 10 11   CREATININE 0.71 0.63 0.62 0.52 0.67  CALCIUM 9.1 8.8* 8.7* 8.8* 8.7*  MG  --   --  1.7  --   --    Liver Function Tests: Recent Labs  Lab 08/28/18 0520 08/29/18 0441 08/30/18 0426 08/31/18 0422 09/01/18 0440  AST 150* 55* 36 23 18  ALT 195* 117* 86* 67*  50*  ALKPHOS 373* 297* 275* 263* 234*  BILITOT 1.3* 1.2 1.2 0.5 0.7  PROT 5.9* 5.5* 5.7* 5.9* 5.6*  ALBUMIN 2.4* 2.2* 2.3* 2.1* 2.0*   Recent Labs  Lab 08/27/18 1347 08/28/18 0520  LIPASE 491* 215*   No results for input(s): AMMONIA in the last 168 hours. CBC: Recent Labs  Lab 08/28/18 0520 08/29/18 0441 08/30/18 0426 08/31/18 0422  WBC 12.6* 11.3* 10.9* 9.5  NEUTROABS  --  8.4* 7.6 7.6  HGB 12.6 12.0 12.4 12.8  HCT 41.3 39.1 39.5 41.8  MCV 97.6 97.0 94.3 97.2  PLT 103* 103* 117* 133*   Cardiac Enzymes: No results for input(s): CKTOTAL,  CKMB, CKMBINDEX, TROPONINI in the last 168 hours. BNP: BNP (last 3 results) No results for input(s): BNP in the last 8760 hours.  ProBNP (last 3 results) No results for input(s): PROBNP in the last 8760 hours.  CBG: Recent Labs  Lab 09/02/18 0748 09/02/18 1226 09/02/18 1627 09/02/18 2130 09/03/18 0807  GLUCAP 115* 215* 209* 169* 145*       Signed:  Nita Sells MD   [facility] 09/03/2018, 10:09 AM

## 2018-09-03 NOTE — Clinical Social Work Placement (Signed)
Pt admitting to [facility] - [phone]. Arranging [facility] transport Pt's DC information provided on the [place]  NOTE  Date:  09/03/2018  Patient Details  Name: Heidi Dominguez MRN: [id number] Date of Birth: 03-05-1921  Clinical Social Work is seeking post-discharge placement for this patient at the [place] level of care (*CSW will initial, date and re-position this form in  chart as items are completed):  Yes   Patient/family provided with [place] Work Department's list of facilities offering this level of care within the geographic area requested by the patient (or if unable, by the patient's family).  Yes   Patient/family informed of their freedom to choose among providers that offer the needed level of care, that participate in Medicare, Medicaid or managed care program needed by the patient, have an available bed and are willing to accept the patient.  Yes   Patient/family informed of [facility]'s ownership interest in [facility] and [facility], as well as of the fact that they are under no obligation to receive care at these facilities.  PASRR submitted to EDS on 08/31/18     PASRR number received on 08/31/18     Existing PASRR number confirmed on       FL2 transmitted to all facilities in geographic area requested by pt/family on 08/31/18     FL2 transmitted to all facilities within larger geographic area on       Patient informed that his/her managed care company has contracts with or will negotiate with certain facilities, including the following:        Yes   Patient/family informed of bed offers received.  Patient chooses bed at [facility]     Physician recommends and patient chooses bed at [facility]    Patient to be transferred to [facility] on 09/03/18.  Patient to be transferred to facility by [facility]     Patient family notified on 09/03/18 of transfer.  Name of family member  notified:  daughter [name]     PHYSICIAN       Additional Comment:    _______________________________________________ Nila Nephew, LCSW 09/03/2018, 4:19 PM [phone]

## 2018-09-03 NOTE — Progress Notes (Signed)
Pt's daughter is visiting facilities where SNF bed offers are made in order to make selection for pt to admit today.  Sharren Bridge, MSW, LCSW Clinical Social Work 09/03/2018 [phone]

## 2018-09-03 NOTE — Progress Notes (Addendum)
   Subjective: Patient reports pain as moderate and unchanged. Worsened with attempted movement of the right shoulder. Patient seen in rounds with Dr. [name].  Objective: Vital signs in last 24 hours: Temp:  [99.1 F (37.3 C)-99.6 F (37.6 C)] 99.1 F (37.3 C) (01/21 0602) Pulse Rate:  [76-93] 76 (01/21 0602) Resp:  [18-28] 18 (01/20 2122) BP: (101-178)/(57-86) 101/65 (01/21 0602) SpO2:  [93 %-98 %] 93 % (01/21 0602) Weight:  [78.9 kg] 78.9 kg (01/21 0500)  Intake/Output from previous day:  Intake/Output Summary (Last 24 hours) at 09/03/2018 0804 Last data filed at 09/03/2018 0600 Gross per 24 hour  Intake 840 ml  Output 1950 ml  Net -1110 ml    Recent Labs    09/01/18 0440  NA 138  K 3.4*  CL 99  CO2 30  BUN 11  CREATININE 0.67  GLUCOSE 117*  CALCIUM 8.7*   Exam: General - Patient is Alert and Oriented Extremity - Neurologically intact Neurovascular intact Sensation intact distally Intact pulses distally Motor Function - intact, moving wrist and fingers well on exam.  Past Medical History:  Diagnosis Date  . Arthritis    "qwhere" (07/04/2018)  . Chronic back pain    "all over" (07/04/2018)  . Chronic diastolic CHF (congestive heart failure) ([place])   . Complication of anesthesia    "I have a hard time waking up"  . Degenerative joint disease of shoulder region   . Family history of adverse reaction to anesthesia    "daughter has the shakes when she wakes up"  . GERD (gastroesophageal reflux disease)   . Heart murmur   . High cholesterol   . History of hiatal hernia   . HOH (hard of hearing)   . Hypertension   . Osteoarthritis   . Phlebitis    "BLE"  . Type II diabetes mellitus (HCC)     Assessment/Plan: Right shoulder pain s/p hemiarthroplasty  MRI from yesterday showed no stress fracture or fluid collection, chronic rotator cuff tear seen. Discussed with Dr. [name], we will move forward with a right shoulder cortisone injection today and  patient will follow-up with Dr. Veverly Fells on an outpatient basis.   Theresa Duty, PA-C Orthopedic Surgery 09/03/2018, 8:04 AM   I have reviewed her MRI and see no evidence suggesting fluid collection or infection, thus we agreed to proceed with the right shoulder cortisone injection. After a sterile prep the right shoulder joint was injected through an anterolateral approach without difficulty with 2 ml of 1% Lidocaine and 80 mg of Depomedrol. She tolerated the injection well without problems. Plan follow up with Dr. Veverly Fells in 2 weeks

## 2018-09-03 NOTE — Progress Notes (Signed)
Patient dressed and assisted over to stretcher. No questions or concerns at the time of discharge. Patient discharged, left via [facility], daughter to meet patient at [facility].

## 2018-09-05 DIAGNOSIS — E119 Type 2 diabetes mellitus without complications: Secondary | ICD-10-CM | POA: Diagnosis not present

## 2018-09-05 DIAGNOSIS — R945 Abnormal results of liver function studies: Secondary | ICD-10-CM | POA: Diagnosis not present

## 2018-09-05 DIAGNOSIS — I5032 Chronic diastolic (congestive) heart failure: Secondary | ICD-10-CM | POA: Diagnosis not present

## 2018-09-05 DIAGNOSIS — K851 Biliary acute pancreatitis without necrosis or infection: Secondary | ICD-10-CM | POA: Diagnosis not present

## 2018-09-10 ENCOUNTER — Other Ambulatory Visit: Payer: Self-pay | Admitting: *Deleted

## 2018-09-10 DIAGNOSIS — I5032 Chronic diastolic (congestive) heart failure: Secondary | ICD-10-CM | POA: Diagnosis not present

## 2018-09-10 DIAGNOSIS — M75101 Unspecified rotator cuff tear or rupture of right shoulder, not specified as traumatic: Secondary | ICD-10-CM | POA: Diagnosis not present

## 2018-09-10 DIAGNOSIS — E119 Type 2 diabetes mellitus without complications: Secondary | ICD-10-CM | POA: Diagnosis not present

## 2018-09-12 ENCOUNTER — Encounter (HOSPITAL_COMMUNITY): Payer: Self-pay

## 2018-09-12 ENCOUNTER — Inpatient Hospital Stay (HOSPITAL_COMMUNITY)
Admission: EM | Admit: 2018-09-12 | Discharge: 2018-09-16 | DRG: 690 | Disposition: A | Discharge status: Skilled Nursing Facility | Payer: Medicare Other | Source: Skilled Nursing Facility | Attending: Internal Medicine | Admitting: Internal Medicine

## 2018-09-12 ENCOUNTER — Other Ambulatory Visit: Payer: Self-pay

## 2018-09-12 ENCOUNTER — Emergency Department (HOSPITAL_COMMUNITY): Payer: Medicare Other

## 2018-09-12 DIAGNOSIS — D519 Vitamin B12 deficiency anemia, unspecified: Secondary | ICD-10-CM | POA: Diagnosis not present

## 2018-09-12 DIAGNOSIS — M75101 Unspecified rotator cuff tear or rupture of right shoulder, not specified as traumatic: Secondary | ICD-10-CM | POA: Diagnosis present

## 2018-09-12 DIAGNOSIS — R52 Pain, unspecified: Secondary | ICD-10-CM | POA: Diagnosis not present

## 2018-09-12 DIAGNOSIS — Z8249 Family history of ischemic heart disease and other diseases of the circulatory system: Secondary | ICD-10-CM | POA: Diagnosis not present

## 2018-09-12 DIAGNOSIS — E785 Hyperlipidemia, unspecified: Secondary | ICD-10-CM | POA: Diagnosis present

## 2018-09-12 DIAGNOSIS — E1122 Type 2 diabetes mellitus with diabetic chronic kidney disease: Secondary | ICD-10-CM | POA: Diagnosis present

## 2018-09-12 DIAGNOSIS — Z7401 Bed confinement status: Secondary | ICD-10-CM | POA: Diagnosis not present

## 2018-09-12 DIAGNOSIS — N3000 Acute cystitis without hematuria: Secondary | ICD-10-CM | POA: Diagnosis not present

## 2018-09-12 DIAGNOSIS — I5032 Chronic diastolic (congestive) heart failure: Secondary | ICD-10-CM | POA: Diagnosis present

## 2018-09-12 DIAGNOSIS — R1312 Dysphagia, oropharyngeal phase: Secondary | ICD-10-CM | POA: Diagnosis not present

## 2018-09-12 DIAGNOSIS — N3281 Overactive bladder: Secondary | ICD-10-CM | POA: Diagnosis not present

## 2018-09-12 DIAGNOSIS — Z882 Allergy status to sulfonamides status: Secondary | ICD-10-CM | POA: Diagnosis not present

## 2018-09-12 DIAGNOSIS — I130 Hypertensive heart and chronic kidney disease with heart failure and stage 1 through stage 4 chronic kidney disease, or unspecified chronic kidney disease: Secondary | ICD-10-CM | POA: Diagnosis present

## 2018-09-12 DIAGNOSIS — N390 Urinary tract infection, site not specified: Secondary | ICD-10-CM | POA: Diagnosis not present

## 2018-09-12 DIAGNOSIS — R5381 Other malaise: Secondary | ICD-10-CM | POA: Diagnosis not present

## 2018-09-12 DIAGNOSIS — R110 Nausea: Secondary | ICD-10-CM | POA: Diagnosis not present

## 2018-09-12 DIAGNOSIS — E559 Vitamin D deficiency, unspecified: Secondary | ICD-10-CM | POA: Diagnosis not present

## 2018-09-12 DIAGNOSIS — R0902 Hypoxemia: Secondary | ICD-10-CM | POA: Diagnosis not present

## 2018-09-12 DIAGNOSIS — Z833 Family history of diabetes mellitus: Secondary | ICD-10-CM | POA: Diagnosis not present

## 2018-09-12 DIAGNOSIS — Z741 Need for assistance with personal care: Secondary | ICD-10-CM | POA: Diagnosis not present

## 2018-09-12 DIAGNOSIS — N183 Chronic kidney disease, stage 3 unspecified: Secondary | ICD-10-CM | POA: Diagnosis present

## 2018-09-12 DIAGNOSIS — R109 Unspecified abdominal pain: Secondary | ICD-10-CM | POA: Diagnosis not present

## 2018-09-12 DIAGNOSIS — R531 Weakness: Secondary | ICD-10-CM | POA: Diagnosis present

## 2018-09-12 DIAGNOSIS — D529 Folate deficiency anemia, unspecified: Secondary | ICD-10-CM | POA: Diagnosis not present

## 2018-09-12 DIAGNOSIS — B965 Pseudomonas (aeruginosa) (mallei) (pseudomallei) as the cause of diseases classified elsewhere: Secondary | ICD-10-CM | POA: Diagnosis present

## 2018-09-12 DIAGNOSIS — K5900 Constipation, unspecified: Secondary | ICD-10-CM | POA: Diagnosis not present

## 2018-09-12 DIAGNOSIS — Z96653 Presence of artificial knee joint, bilateral: Secondary | ICD-10-CM | POA: Diagnosis present

## 2018-09-12 DIAGNOSIS — E119 Type 2 diabetes mellitus without complications: Secondary | ICD-10-CM | POA: Diagnosis not present

## 2018-09-12 DIAGNOSIS — R112 Nausea with vomiting, unspecified: Secondary | ICD-10-CM | POA: Diagnosis not present

## 2018-09-12 DIAGNOSIS — K219 Gastro-esophageal reflux disease without esophagitis: Secondary | ICD-10-CM | POA: Diagnosis present

## 2018-09-12 DIAGNOSIS — I10 Essential (primary) hypertension: Secondary | ICD-10-CM | POA: Diagnosis present

## 2018-09-12 DIAGNOSIS — T85520A Displacement of bile duct prosthesis, initial encounter: Secondary | ICD-10-CM | POA: Diagnosis not present

## 2018-09-12 DIAGNOSIS — T85520D Displacement of bile duct prosthesis, subsequent encounter: Secondary | ICD-10-CM | POA: Diagnosis not present

## 2018-09-12 DIAGNOSIS — M6281 Muscle weakness (generalized): Secondary | ICD-10-CM | POA: Diagnosis not present

## 2018-09-12 DIAGNOSIS — R2689 Other abnormalities of gait and mobility: Secondary | ICD-10-CM | POA: Diagnosis not present

## 2018-09-12 DIAGNOSIS — K8050 Calculus of bile duct without cholangitis or cholecystitis without obstruction: Secondary | ICD-10-CM | POA: Diagnosis not present

## 2018-09-12 DIAGNOSIS — R1110 Vomiting, unspecified: Secondary | ICD-10-CM | POA: Diagnosis not present

## 2018-09-12 DIAGNOSIS — M2550 Pain in unspecified joint: Secondary | ICD-10-CM | POA: Diagnosis not present

## 2018-09-12 HISTORY — DX: Urinary tract infection, site not specified: N39.0

## 2018-09-12 LAB — CBC WITH DIFFERENTIAL/PLATELET
Abs Immature Granulocytes: 0.09 10*3/uL — ABNORMAL HIGH (ref 0.00–0.07)
BASOS PCT: 0 %
Basophils Absolute: 0 10*3/uL (ref 0.0–0.1)
Eosinophils Absolute: 0.1 10*3/uL (ref 0.0–0.5)
Eosinophils Relative: 1 %
HCT: 42.9 % (ref 36.0–46.0)
Hemoglobin: 13.2 g/dL (ref 12.0–15.0)
Immature Granulocytes: 1 %
Lymphocytes Relative: 6 %
Lymphs Abs: 0.9 10*3/uL (ref 0.7–4.0)
MCH: 30.1 pg (ref 26.0–34.0)
MCHC: 30.8 g/dL (ref 30.0–36.0)
MCV: 97.7 fL (ref 80.0–100.0)
Monocytes Absolute: 0.9 10*3/uL (ref 0.1–1.0)
Monocytes Relative: 7 %
Neutro Abs: 11.4 10*3/uL — ABNORMAL HIGH (ref 1.7–7.7)
Neutrophils Relative %: 85 %
Platelets: 246 10*3/uL (ref 150–400)
RBC: 4.39 MIL/uL (ref 3.87–5.11)
RDW: 13.5 % (ref 11.5–15.5)
WBC: 13.3 10*3/uL — ABNORMAL HIGH (ref 4.0–10.5)
nRBC: 0 % (ref 0.0–0.2)

## 2018-09-12 LAB — COMPREHENSIVE METABOLIC PANEL
ALK PHOS: 177 U/L — AB (ref 38–126)
ALT: 17 U/L (ref 0–44)
AST: 20 U/L (ref 15–41)
Albumin: 2.8 g/dL — ABNORMAL LOW (ref 3.5–5.0)
Anion gap: 9 (ref 5–15)
BUN: 16 mg/dL (ref 8–23)
CO2: 28 mmol/L (ref 22–32)
Calcium: 9.1 mg/dL (ref 8.9–10.3)
Chloride: 97 mmol/L — ABNORMAL LOW (ref 98–111)
Creatinine, Ser: 0.69 mg/dL (ref 0.44–1.00)
GFR calc Af Amer: >60 mL/min (ref >60)
GFR calc non Af Amer: >60 mL/min (ref >60)
Glucose, Bld: 195 mg/dL — ABNORMAL HIGH (ref 70–99)
Potassium: 4.2 mmol/L (ref 3.5–5.1)
Sodium: 134 mmol/L — ABNORMAL LOW (ref 135–145)
Total Bilirubin: 0.6 mg/dL (ref 0.3–1.2)
Total Protein: 6.8 g/dL (ref 6.5–8.1)

## 2018-09-12 LAB — URINALYSIS, ROUTINE W REFLEX MICROSCOPIC
Bilirubin Urine: NEGATIVE
Glucose, UA: NEGATIVE mg/dL
Ketones, ur: NEGATIVE mg/dL
Nitrite: NEGATIVE
Protein, ur: NEGATIVE mg/dL
SPECIFIC GRAVITY, URINE: 1.008 (ref 1.005–1.030)
WBC, UA: >50 WBC/hpf — ABNORMAL HIGH (ref 0–5)
pH: 6 (ref 5.0–8.0)

## 2018-09-12 LAB — I-STAT TROPONIN, ED: Troponin i, poc: 0 ng/mL (ref 0.00–0.08)

## 2018-09-12 LAB — LIPASE, BLOOD: LIPASE: 38 U/L (ref 11–51)

## 2018-09-12 MED ORDER — SODIUM CHLORIDE 0.9 % IV SOLN
1.0000 g | Freq: Once | INTRAVENOUS | Status: AC
  Administered 2018-09-12: 1 g via INTRAVENOUS
  Filled 2018-09-12: qty 10

## 2018-09-12 NOTE — ED Notes (Signed)
Bed: WA06 Expected date:  Expected time:  Means of arrival:  Comments: EMS-decreased urine output

## 2018-09-12 NOTE — ED Provider Notes (Signed)
[facility] DEPT Provider Note   CSN: [id number] Arrival date & time: 09/12/18  1815     History   Chief Complaint Chief Complaint  Patient presents with  . Nausea  . decreased urinary output    HPI Heidi Dominguez is a 85 y.o. female.  The history is provided by the patient, a relative, medical records and the EMS personnel. No language interpreter was used.   Heidi Dominguez is a 85 y.o. female who presents to the Emergency Department complaining of vomiting. She presents to the emergency department complaining of nausea and vomiting that started around 3 o'clock today. She has experienced decreased urine and stool output today. She denies any abdominal pain but states it's uncomfortable at times. She was recently discharged from the hospital for acute pancreatitis requiring stent placement. She was doing okay at the time of hospital discharge. She denies any fevers, chest pain, shortness of breath. She states that she has been feeling lightheaded and dizziness associated with the vomiting. Past Medical History:  Diagnosis Date  . Arthritis    "qwhere" (07/04/2018)  . Chronic back pain    "all over" (07/04/2018)  . Chronic diastolic CHF (congestive heart failure) ([place])   . Complication of anesthesia    "I have a hard time waking up"  . Degenerative joint disease of shoulder region   . Family history of adverse reaction to anesthesia    "daughter has the shakes when she wakes up"  . GERD (gastroesophageal reflux disease)   . Heart murmur   . High cholesterol   . History of hiatal hernia   . HOH (hard of hearing)   . Hypertension   . Osteoarthritis   . Phlebitis    "BLE"  . Type II diabetes mellitus [facility])     Patient Active Problem List   Diagnosis Date Noted  . UTI (urinary tract infection) 09/12/2018  . Acute pancreatitis 08/28/2018  . Acute recurrent pancreatitis   . Abdominal pain 08/23/2018  . Elevated LFTs 07/04/2018  . CKD  (chronic kidney disease) stage 3, GFR 30-59 ml/min (HCC) 07/04/2018  . Upper airway cough syndrome 03/13/2018  . Sensorineural hearing loss (SNHL), bilateral 12/04/2016  . Post-nasal drainage 03/14/2016  . Presbycusis of both ears 03/14/2016  . Bilateral hearing loss 10/06/2015  . Bilateral impacted cerumen 10/06/2015  . 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 Subjective tinnitus of both ears 10/06/2015  . Choledocholithiasis with chronic cholecystitis, nonobstructing 04/11/2015  . Pre-operative cardiovascular examination, high risk surgery   . Epigastric abdominal pain 04/08/2015  . Acute gallstone pancreatitis s/p lap chole w [facility] 04/11/2015 04/08/2015  . Diabetes mellitus type 2, controlled ([place]) 04/08/2015  . Gallstone pancreatitis 04/08/2015  . Mitral regurgitation 04/08/2015  . Chronic diastolic CHF (congestive heart failure) ([place]) 04/08/2015  . Dyspnea 03/31/2015  . Ischemic colitis ([place]) 08/09/2012  . Colitis 08/07/2012  . Rectal bleeding 08/07/2012  . Weakness 09/24/2011  . Bradycardia 09/24/2011  . Acute lower UTI 09/24/2011  . Hematochezia 09/24/2011  . Essential hypertension 09/24/2011  . Dyslipidemia 09/24/2011    Past Surgical History:  Procedure Laterality Date  . ABDOMINAL HYSTERECTOMY    . BACK SURGERY    . CATARACT EXTRACTION W/ INTRAOCULAR LENS  IMPLANT, BILATERAL Bilateral   . ERCP N/A 08/30/2018   Procedure: ENDOSCOPIC RETROGRADE CHOLANGIOPANCREATOGRAPHY (ERCP);  Surgeon: Clarene Essex, MD;  Location: [facility];  Service: Endoscopy;  Laterality: N/A;  . FIXATION KYPHOPLASTY    . JOINT REPLACEMENT    .  LAPAROSCOPIC CHOLECYSTECTOMY SINGLE SITE WITH INTRAOPERATIVE CHOLANGIOGRAM N/A 04/11/2015   Procedure: LAPAROSCOPIC LYSIS OF ADHESIONS, LAPAROSCOPIC CHOLECYSTECTOMY WITH INTRAOPERATIVE CHOLANGIOGRAM;  Surgeon: Michael Boston, MD;  Location: [facility];  Service: General;  Laterality: N/A;  . PANCREATIC STENT PLACEMENT  08/30/2018   Procedure:  PANCREATIC STENT PLACEMENT;  Surgeon: Clarene Essex, MD;  Location: [facility];  Service: Endoscopy;;  . REMOVAL OF STONES  08/30/2018   Procedure: REMOVAL OF STONES;  Surgeon: Clarene Essex, MD;  Location: [facility];  Service: Endoscopy;;  . SHOULDER OPEN ROTATOR CUFF REPAIR Bilateral   . SPHINCTEROTOMY  08/30/2018   Procedure: SPHINCTEROTOMY;  Surgeon: Clarene Essex, MD;  Location: [facility];  Service: Endoscopy;;  . TOTAL KNEE ARTHROPLASTY Bilateral      OB History   No obstetric history on file.      Home Medications    Prior to Admission medications   Medication Sig Start Date End Date Taking? Authorizing Provider  acetaminophen (TYLENOL) 500 MG tablet Take 1 tablet (500 mg total) by mouth every 6 (six) hours as needed for mild pain or moderate pain. 09/03/18  Yes Nita Sells, MD  albuterol (PROVENTIL HFA;VENTOLIN HFA) 108 (90 Base) MCG/ACT inhaler Inhale into the lungs every 6 (six) hours as needed for wheezing or shortness of breath.   Yes [provider]  amLODipine (NORVASC) 5 MG tablet Take 1 tablet (5 mg total) by mouth daily. 09/04/18  Yes Nita Sells, MD  aspirin 81 MG tablet Take 81 mg by mouth daily.   Yes [provider]  darifenacin (ENABLEX) 7.5 MG 24 hr tablet Take 7.5 mg by mouth daily.   Yes [provider]  famotidine (PEPCID) 20 MG tablet Take 20 mg by mouth at bedtime.   Yes [provider]  Ferrous Gluconate (IRON) 240 (27 FE) MG TABS Take 1 tablet by mouth daily.   Yes [provider]  folic acid (FOLVITE) 341 MCG tablet Take 400 mcg by mouth daily.   Yes [provider]  hydrochlorothiazide 10 mg/mL SUSP Take 0.63 mLs (6.25 mg total) by mouth daily. 09/03/18  Yes Nita Sells, MD  JANUVIA 50 MG tablet Take 50 mg by mouth daily.  03/11/15  Yes [provider]  loratadine (CLARITIN) 10 MG tablet Take 10 mg by mouth daily.   Yes [provider]  magnesium oxide (MAG-OX)  400 (241.3 Mg) MG tablet Take 1 tablet (400 mg total) by mouth daily. 09/04/18  Yes Nita Sells, MD  pantoprazole (PROTONIX) 40 MG tablet Take 40 mg by mouth daily.   Yes [provider]  Polyethyl Glycol-Propyl Glycol (SYSTANE) 0.4-0.3 % SOLN Place 2 drops into both eyes every 4 (four) hours as needed (dry eyes).   Yes [provider]  polyethylene glycol (MIRALAX / GLYCOLAX) packet Take 17 g by mouth daily as needed for mild constipation.   Yes [provider]  vitamin B-12 (CYANOCOBALAMIN) 500 MCG tablet Take 1,000 mcg by mouth daily.    Yes [provider]  VITAMIN D, CHOLECALCIFEROL, PO Take 5,000 Units by mouth daily.   Yes [provider]    Family History Family History  Problem Relation Age of Onset  . Hypertension Mother   . Diabetes Mellitus I Mother   . Hypertension Father     Social History Social History   Tobacco Use  . Smoking status: Never Smoker  . Smokeless tobacco: Never Used  Substance Use Topics  . Alcohol use: No    Comment: rare  .  Drug use: No     Allergies   Catapres [clonidine hcl]; Codeine; Covera-hs [verapamil hcl]; Hydrocodone; Other; Sulfa antibiotics; Sulfamethoxazole; Verapamil; and Sulfasalazine   Review of Systems Review of Systems  All other systems reviewed and are negative.    Physical Exam Updated Vital Signs BP 137/85 (BP Location: Left Arm)   Pulse 84   Temp 98.5 F (36.9 C) (Oral)   Resp (!) 21   Ht 5\' 4"  (1.626 m)   Wt 76.5 kg   SpO2 93%   BMI 28.95 kg/m   Physical Exam Vitals signs and nursing note reviewed.  Constitutional:      Appearance: She is well-developed.  HENT:     Head: Normocephalic and atraumatic.  Cardiovascular:     Rate and Rhythm: Normal rate and regular rhythm.     Heart sounds: Murmur present.  Pulmonary:     Effort: Pulmonary effort is normal. No respiratory distress.     Breath sounds: Normal breath sounds.  Abdominal:     Palpations:  Abdomen is soft.     Tenderness: There is no guarding or rebound.     Comments: Mild central abdominal and lower abdominal tenderness  Musculoskeletal:        General: No swelling or tenderness.  Skin:    General: Skin is warm and dry.  Neurological:     Mental Status: She is alert and oriented to person, place, and time.  Psychiatric:        Behavior: Behavior normal.      ED Treatments / Results  Labs (all labs ordered are listed, but only abnormal results are displayed) Labs Reviewed  COMPREHENSIVE METABOLIC PANEL - Abnormal; Notable for the following components:      Result Value   Sodium 134 (*)    Chloride 97 (*)    Glucose, Bld 195 (*)    Albumin 2.8 (*)    Alkaline Phosphatase 177 (*)    All other components within normal limits  URINALYSIS, ROUTINE W REFLEX MICROSCOPIC - Abnormal; Notable for the following components:   APPearance CLOUDY (*)    Hgb urine dipstick MODERATE (*)    Leukocytes, UA LARGE (*)    WBC, UA >50 (*)    Bacteria, UA MANY (*)    All other components within normal limits  CBC WITH DIFFERENTIAL/PLATELET - Abnormal; Notable for the following components:   WBC 13.3 (*)    Neutro Abs 11.4 (*)    Abs Immature Granulocytes 0.09 (*)    All other components within normal limits  URINE CULTURE  LIPASE, BLOOD  I-STAT TROPONIN, ED    EKG EKG Interpretation  Date/Time:  Thursday September 12 2018 19:32:15 EST Ventricular Rate:  83 PR Interval:    QRS Duration: 90 QT Interval:  380 QTC Calculation: 447 R Axis:   35 Text Interpretation:  Sinus rhythm Consider left atrial enlargement Borderline T wave abnormalities Confirmed by Quintella Reichert [phone]) on 09/12/2018 7:34:13 PM   Radiology Dg Abdomen 1 View  Result Date: 09/12/2018 CLINICAL DATA:  Nausea and vomiting. Decreased urine output and constipation for 24 hours. EXAM: ABDOMEN - 1 VIEW COMPARISON:  MRI abdomen 08/27/2018. CT abdomen and pelvis 08/23/2018. Abdomen 08/23/2018. FINDINGS: Gas  and stool throughout the colon. No small or large bowel distention is seen. No radiopaque stones. Surgical clips in the right upper quadrant. Stent in the left upper quadrant may be in the pancreas. Patient positioning limits evaluation. Lumbar scoliosis convex towards the right with degenerative changes and  kyphoplasty changes. Vascular calcifications. IMPRESSION: Nonobstructive bowel gas pattern. Electronically Signed   By: Lucienne Capers M.D.   On: 09/12/2018 22:20   Dg Chest Port 1 View  Result Date: 09/12/2018 CLINICAL DATA:  Vomiting. EXAM: PORTABLE CHEST 1 VIEW COMPARISON:  07/04/2018 and prior chest radiographs FINDINGS: Patient is rotated and leaning to the LEFT. Cardiomegaly identified. No definite acute abnormalities noted. There is no evidence of focal airspace disease, pulmonary edema, suspicious pulmonary nodule/mass, pleural effusion, or pneumothorax. No acute bony abnormalities are identified. Bilateral humeral prosthesis again noted. IMPRESSION: Cardiomegaly without evidence of acute cardiopulmonary disease. Electronically Signed   By: Margarette Canada M.D.   On: 09/12/2018 19:42    Procedures Procedures (including critical care time)  Medications Ordered in ED Medications  cefTRIAXone (ROCEPHIN) 1 g in sodium chloride 0.9 % 100 mL IVPB (0 g Intravenous Stopped 09/12/18 2314)     Initial Impression / Assessment and Plan / ED Course  I have reviewed the triage vital signs and the nursing notes.  Pertinent labs & imaging results that were available during my care of the patient were reviewed by me and considered in my medical decision making (see chart for details).     Patient with history of recent biliary stent placement here for evaluation of nausea, vomiting and abdominal discomfort. She is non-toxic appearing on evaluation. Labs demonstrate normal liver function. UA is concerning for UTI, will treat with antibiotics. Plan to admit for further evaluation.  Patient and family  updated of findings of studies and recommendation for admission and they are in agreement with treatment plan.    Final Clinical Impressions(s) / ED Diagnoses   Final diagnoses:  None    ED Discharge Orders    None       Quintella Reichert, MD 09/13/18 [phone]

## 2018-09-12 NOTE — ED Triage Notes (Signed)
Per EMS-states N/V and decreased urine output and constipation for the last 24 hours-Zofran given at nursing home, no longer nauseated-complaining of chronic right shoulder pain

## 2018-09-12 NOTE — H&P (Signed)
History and Physical   Heidi Dominguez XNA:[id number] DOB: 05-26-1921 DOA: 09/12/2018  Referring MD/NP/PA: Dr. Ralene Bathe  PCP: Lajean Manes, MD   Outpatient Specialists: None  Patient coming from: Home  Chief Complain: Nausea and decreased urine output  HPI: Heidi Dominguez is a 85 y.o. female with medical history significant of recent biliary colic with stent placement, significant arthritis, diabetes, chronic diastolic CHF, GERD, hypertension, hyperlipidemia and poor hearing who was brought to the ER with complaint of nausea and decreased urine output all day.  She had no abdominal pain and no vomiting.  Denied any diarrhea.  No hematochezia no melena or bright red blood per rectum.  Patient has had some mild dysuria.  She did have acute gallstone pancreatitis recently which seems to have resolved.  In the ER urinalysis is consistent with UTI.  Due to patient's age and poor oral intake from her current infection she is being admitted to the hospital for treatment..  ED Course: Temperature is 98.5 blood pressure 156/68 pulse 87 respirate of 22 oxygen sat 91% room air.  White count is 13.3 the rest of the CBC is normal.  Sodium 134 chloride 97 and glucose 195.  The rest of the chemistry appears to be within normal.  Urinalysis showed cloudy urine with moderate hemoglobin large leukocyte Estrace many bacteria and WBC clumps.  Abdominal x-ray showed nonobstructive bowel gas pattern.  Chest x-ray showed no active findings.  Patient has received a dose of Rocephin and is being admitted with UTI.  Urine and blood cultures have been obtained.  Review of Systems: As per HPI otherwise 10 point review of systems negative.    Past Medical History:  Diagnosis Date  . Arthritis    "qwhere" (07/04/2018)  . Chronic back pain    "all over" (07/04/2018)  . Chronic diastolic CHF (congestive heart failure) ([place])   . Complication of anesthesia    "I have a hard time waking up"  . Degenerative joint disease of  shoulder region   . Family history of adverse reaction to anesthesia    "daughter has the shakes when she wakes up"  . GERD (gastroesophageal reflux disease)   . Heart murmur   . High cholesterol   . History of hiatal hernia   . HOH (hard of hearing)   . Hypertension   . Osteoarthritis   . Phlebitis    "BLE"  . Type II diabetes mellitus ([place])     Past Surgical History:  Procedure Laterality Date  . ABDOMINAL HYSTERECTOMY    . BACK SURGERY    . CATARACT EXTRACTION W/ INTRAOCULAR LENS  IMPLANT, BILATERAL Bilateral   . ERCP N/A 08/30/2018   Procedure: ENDOSCOPIC RETROGRADE CHOLANGIOPANCREATOGRAPHY (ERCP);  Surgeon: Clarene Essex, MD;  Location: [facility];  Service: Endoscopy;  Laterality: N/A;  . FIXATION KYPHOPLASTY    . JOINT REPLACEMENT    . LAPAROSCOPIC CHOLECYSTECTOMY SINGLE SITE WITH INTRAOPERATIVE CHOLANGIOGRAM N/A 04/11/2015   Procedure: LAPAROSCOPIC LYSIS OF ADHESIONS, LAPAROSCOPIC CHOLECYSTECTOMY WITH INTRAOPERATIVE CHOLANGIOGRAM;  Surgeon: Michael Boston, MD;  Location: [facility];  Service: General;  Laterality: N/A;  . PANCREATIC STENT PLACEMENT  08/30/2018   Procedure: PANCREATIC STENT PLACEMENT;  Surgeon: Clarene Essex, MD;  Location: [facility];  Service: Endoscopy;;  . REMOVAL OF STONES  08/30/2018   Procedure: REMOVAL OF STONES;  Surgeon: Clarene Essex, MD;  Location: [facility];  Service: Endoscopy;;  . SHOULDER OPEN ROTATOR CUFF REPAIR Bilateral   . SPHINCTEROTOMY  08/30/2018   Procedure: SPHINCTEROTOMY;  Surgeon:  Clarene Essex, MD;  Location: [facility];  Service: Endoscopy;;  . TOTAL KNEE ARTHROPLASTY Bilateral      reports that she has never smoked. She has never used smokeless tobacco. She reports that she does not drink alcohol or use drugs.  Allergies  Allergen Reactions  . Catapres [Clonidine Hcl] Other (See Comments)    Unknown. Unknown.  . Codeine Other (See Comments)    "Makes me out of my head, loopy"  . Covera-Hs [Verapamil Hcl] Other (See Comments)     Unknown.  Marland Kitchen Hydrocodone Other (See Comments)    "makes me go out of my head, loopy"  . Other Other (See Comments)    Any type of narcotics Feels "loopy" Any type of narcotics Feels "loopy"  . Sulfa Antibiotics Itching  . Sulfamethoxazole Diarrhea  . Verapamil Diarrhea and Other (See Comments)  . Sulfasalazine Itching    Family History  Problem Relation Age of Onset  . Hypertension Mother   . Diabetes Mellitus I Mother   . Hypertension Father      Prior to Admission medications   Medication Sig Start Date End Date Taking? Authorizing Provider  acetaminophen (TYLENOL) 500 MG tablet Take 1 tablet (500 mg total) by mouth every 6 (six) hours as needed for mild pain or moderate pain. 09/03/18  Yes Nita Sells, MD  albuterol (PROVENTIL HFA;VENTOLIN HFA) 108 (90 Base) MCG/ACT inhaler Inhale into the lungs every 6 (six) hours as needed for wheezing or shortness of breath.   Yes [provider]  amLODipine (NORVASC) 5 MG tablet Take 1 tablet (5 mg total) by mouth daily. 09/04/18  Yes Nita Sells, MD  aspirin 81 MG tablet Take 81 mg by mouth daily.   Yes [provider]  darifenacin (ENABLEX) 7.5 MG 24 hr tablet Take 7.5 mg by mouth daily.   Yes [provider]  famotidine (PEPCID) 20 MG tablet Take 20 mg by mouth at bedtime.   Yes [provider]  Ferrous Gluconate (IRON) 240 (27 FE) MG TABS Take 1 tablet by mouth daily.   Yes [provider]  folic acid (FOLVITE) 194 MCG tablet Take 400 mcg by mouth daily.   Yes [provider]  hydrochlorothiazide 10 mg/mL SUSP Take 0.63 mLs (6.25 mg total) by mouth daily. 09/03/18  Yes Nita Sells, MD  JANUVIA 50 MG tablet Take 50 mg by mouth daily.  03/11/15  Yes [provider]  loratadine (CLARITIN) 10 MG tablet Take 10 mg by mouth daily.   Yes [provider]  magnesium oxide (MAG-OX) 400 (241.3 Mg) MG tablet Take 1 tablet (400 mg total) by mouth daily.  09/04/18  Yes Nita Sells, MD  pantoprazole (PROTONIX) 40 MG tablet Take 40 mg by mouth daily.   Yes [provider]  Polyethyl Glycol-Propyl Glycol (SYSTANE) 0.4-0.3 % SOLN Place 2 drops into both eyes every 4 (four) hours as needed (dry eyes).   Yes [provider]  polyethylene glycol (MIRALAX / GLYCOLAX) packet Take 17 g by mouth daily as needed for mild constipation.   Yes [provider]  vitamin B-12 (CYANOCOBALAMIN) 500 MCG tablet Take 1,000 mcg by mouth daily.    Yes [provider]  VITAMIN D, CHOLECALCIFEROL, PO Take 5,000 Units by mouth daily.   Yes [provider]    Physical Exam: Vitals:   09/12/18 1840 09/12/18 1928 09/12/18 2043 09/12/18 2230  BP:  (!) 148/70 (!) 153/72 137/85  Pulse:  83 84 84  Resp:  Marland Kitchen)  22 20 (!) 21  Temp:      TempSrc:      SpO2:  91% 93% 93%  Weight: 76.5 kg     Height: 5\' 4"  (1.626 m)         Constitutional: NAD, calm, comfortable, weak Vitals:   09/12/18 1840 09/12/18 1928 09/12/18 2043 09/12/18 2230  BP:  (!) 148/70 (!) 153/72 137/85  Pulse:  83 84 84  Resp:  (!) 22 20 (!) 21  Temp:      TempSrc:      SpO2:  91% 93% 93%  Weight: 76.5 kg     Height: 5\' 4"  (1.626 m)      Eyes: PERRL, lids and conjunctivae normal ENMT: Mucous membranes are dry. Posterior pharynx clear of any exudate or lesions.Normal dentition.  Neck: normal, supple, no masses, no thyromegaly Respiratory: clear to auscultation bilaterally, no wheezing, no crackles. Normal respiratory effort. No accessory muscle use.  Cardiovascular: Regular rate and rhythm, no murmurs / rubs / gallops. No extremity edema. 2+ pedal pulses. No carotid bruits.  Abdomen: no tenderness, no masses palpated. No hepatosplenomegaly. Bowel sounds positive.  Musculoskeletal: no clubbing / cyanosis. No joint deformity upper and lower extremities. Good ROM, no contractures. Normal muscle tone.  Skin: no rashes, lesions, ulcers. No  induration Neurologic: CN 2-12 grossly intact. Sensation intact, DTR normal. Strength 5/5 in all 4.  Psychiatric: Normal judgment and insight. Alert and oriented x 3. Normal mood.     Labs on Admission: I have personally reviewed following labs and imaging studies  CBC: Recent Labs  Lab 09/12/18 1907  WBC 13.3*  NEUTROABS 11.4*  HGB 13.2  HCT 42.9  MCV 97.7  PLT 938   Basic Metabolic Panel: Recent Labs  Lab 09/12/18 1907  NA 134*  K 4.2  CL 97*  CO2 28  GLUCOSE 195*  BUN 16  CREATININE 0.69  CALCIUM 9.1   GFR: Estimated Creatinine Clearance: 42.1 mL/min (by C-G formula based on SCr of 0.69 mg/dL). Liver Function Tests: Recent Labs  Lab 09/12/18 1907  AST 20  ALT 17  ALKPHOS 177*  BILITOT 0.6  PROT 6.8  ALBUMIN 2.8*   Recent Labs  Lab 09/12/18 1907  LIPASE 38   No results for input(s): AMMONIA in the last 168 hours. Coagulation Profile: No results for input(s): INR, PROTIME in the last 168 hours. Cardiac Enzymes: No results for input(s): CKTOTAL, CKMB, CKMBINDEX, TROPONINI in the last 168 hours. BNP (last 3 results) No results for input(s): PROBNP in the last 8760 hours. HbA1C: No results for input(s): HGBA1C in the last 72 hours. CBG: No results for input(s): GLUCAP in the last 168 hours. Lipid Profile: No results for input(s): CHOL, HDL, LDLCALC, TRIG, CHOLHDL, LDLDIRECT in the last 72 hours. Thyroid Function Tests: No results for input(s): TSH, T4TOTAL, FREET4, T3FREE, THYROIDAB in the last 72 hours. Anemia Panel: No results for input(s): VITAMINB12, FOLATE, FERRITIN, TIBC, IRON, RETICCTPCT in the last 72 hours. Urine analysis:    Component Value Date/Time   COLORURINE YELLOW 09/12/2018 2044   APPEARANCEUR CLOUDY (A) 09/12/2018 2044   LABSPEC 1.008 09/12/2018 2044   PHURINE 6.0 09/12/2018 2044   GLUCOSEU NEGATIVE 09/12/2018 2044   HGBUR MODERATE (A) 09/12/2018 2044   BILIRUBINUR NEGATIVE 09/12/2018 2044   KETONESUR NEGATIVE 09/12/2018  2044   PROTEINUR NEGATIVE 09/12/2018 2044   UROBILINOGEN 1.0 04/07/2015 2312   NITRITE NEGATIVE 09/12/2018 2044   LEUKOCYTESUR LARGE (A) 09/12/2018 2044   Sepsis Labs: @LABRCNTIP (procalcitonin:4,lacticidven:4) )No results  found for this or any previous visit (from the past 240 hour(s)).   Radiological Exams on Admission: Dg Abdomen 1 View  Result Date: 09/12/2018 CLINICAL DATA:  Nausea and vomiting. Decreased urine output and constipation for 24 hours. EXAM: ABDOMEN - 1 VIEW COMPARISON:  MRI abdomen 08/27/2018. CT abdomen and pelvis 08/23/2018. Abdomen 08/23/2018. FINDINGS: Gas and stool throughout the colon. No small or large bowel distention is seen. No radiopaque stones. Surgical clips in the right upper quadrant. Stent in the left upper quadrant may be in the pancreas. Patient positioning limits evaluation. Lumbar scoliosis convex towards the right with degenerative changes and kyphoplasty changes. Vascular calcifications. IMPRESSION: Nonobstructive bowel gas pattern. Electronically Signed   By: Lucienne Capers M.D.   On: 09/12/2018 22:20   Dg Chest Port 1 View  Result Date: 09/12/2018 CLINICAL DATA:  Vomiting. EXAM: PORTABLE CHEST 1 VIEW COMPARISON:  07/04/2018 and prior chest radiographs FINDINGS: Patient is rotated and leaning to the LEFT. Cardiomegaly identified. No definite acute abnormalities noted. There is no evidence of focal airspace disease, pulmonary edema, suspicious pulmonary nodule/mass, pleural effusion, or pneumothorax. No acute bony abnormalities are identified. Bilateral humeral prosthesis again noted. IMPRESSION: Cardiomegaly without evidence of acute cardiopulmonary disease. Electronically Signed   By: Margarette Canada M.D.   On: 09/12/2018 19:42    EKG: Independently reviewed.  Normal sinus rhythm with no significant change from previous  Assessment/Plan Principal Problem:   UTI (urinary tract infection) Active Problems:   Weakness   Essential hypertension   CKD  (chronic kidney disease) stage 3, GFR 30-59 ml/min (HCC)     #1 urinary tract infection: Most likely acute cystitis.  May be the cause of nausea.  Patient will be admitted due to her age and weakness.  IV Rocephin.  Urine and blood cultures to be obtained follow closely.  Transition to oral antibiotics based on culture results.  #2 generalized weakness: most likely secondary to UTI.  Hydrate the patient will closely monitor.  May require PT and OT consultation.  #3 chronic kidney disease stage III: Appears to be at baseline.  Continue close monitoring  #4 recent gallstone pancreatitis: Patient should have a stent in place that is supposed to be self expelled.  X-ray is going to be ordered to see if the stent is still in place.   DVT prophylaxis: Heparin Code Status: Full code Family Communication: Son at bedside Disposition Plan: Home Consults called: None Admission status: Inpatient  Severity of Illness: The appropriate patient status for this patient is INPATIENT. Inpatient status is judged to be reasonable and necessary in order to provide the required intensity of service to ensure the patient's safety. The patient's presenting symptoms, physical exam findings, and initial radiographic and laboratory data in the context of their chronic comorbidities is felt to place them at high risk for further clinical deterioration. Furthermore, it is not anticipated that the patient will be medically stable for discharge from the hospital within 2 midnights of admission. The following factors support the patient status of inpatient.   " The patient's presenting symptoms include dysuria, nausea. " The worrisome physical exam findings include generalized weakness. " The initial radiographic and laboratory data are worrisome because of evidence of UTI. " The chronic co-morbidities include acute gallstone pancreatitis.   * I certify that at the point of admission it is my clinical judgment that the  patient will require inpatient hospital care spanning beyond 2 midnights from the point of admission due to high intensity of service,  high risk for further deterioration and high frequency of surveillance required.Heidi Merino MD [facility] Pager [phone]  If 7PM-7AM, please contact night-coverage [email or web address] Password [facility]  09/12/2018, 11:27 PM

## 2018-09-12 NOTE — ED Notes (Addendum)
PLACED PT ON PUREWICK. WILL COLLECT URINE SAMPLE WHEN PT VOIDS

## 2018-09-13 ENCOUNTER — Other Ambulatory Visit: Payer: Self-pay

## 2018-09-13 LAB — CBC
HCT: 38.8 % (ref 36.0–46.0)
Hemoglobin: 11.7 g/dL — ABNORMAL LOW (ref 12.0–15.0)
MCH: 29.5 pg (ref 26.0–34.0)
MCHC: 30.2 g/dL (ref 30.0–36.0)
MCV: 98 fL (ref 80.0–100.0)
Platelets: 199 10*3/uL (ref 150–400)
RBC: 3.96 MIL/uL (ref 3.87–5.11)
RDW: 13.7 % (ref 11.5–15.5)
WBC: 7.8 10*3/uL (ref 4.0–10.5)
nRBC: 0 % (ref 0.0–0.2)

## 2018-09-13 LAB — COMPREHENSIVE METABOLIC PANEL
ALT: 14 U/L (ref 0–44)
AST: 15 U/L (ref 15–41)
Albumin: 2.1 g/dL — ABNORMAL LOW (ref 3.5–5.0)
Alkaline Phosphatase: 134 U/L — ABNORMAL HIGH (ref 38–126)
Anion gap: 7 (ref 5–15)
BUN: 12 mg/dL (ref 8–23)
CHLORIDE: 101 mmol/L (ref 98–111)
CO2: 26 mmol/L (ref 22–32)
Calcium: 7.8 mg/dL — ABNORMAL LOW (ref 8.9–10.3)
Creatinine, Ser: 0.45 mg/dL (ref 0.44–1.00)
GFR calc Af Amer: >60 mL/min (ref >60)
Glucose, Bld: 131 mg/dL — ABNORMAL HIGH (ref 70–99)
Potassium: 3.4 mmol/L — ABNORMAL LOW (ref 3.5–5.1)
Sodium: 134 mmol/L — ABNORMAL LOW (ref 135–145)
Total Bilirubin: 0.9 mg/dL (ref 0.3–1.2)
Total Protein: 5.3 g/dL — ABNORMAL LOW (ref 6.5–8.1)

## 2018-09-13 LAB — GLUCOSE, CAPILLARY
GLUCOSE-CAPILLARY: 126 mg/dL — AB (ref 70–99)
Glucose-Capillary: 131 mg/dL — ABNORMAL HIGH (ref 70–99)
Glucose-Capillary: 195 mg/dL — ABNORMAL HIGH (ref 70–99)
Glucose-Capillary: 130 mg/dL — ABNORMAL HIGH (ref 70–99)
Glucose-Capillary: 92 mg/dL (ref 70–99)

## 2018-09-13 LAB — MRSA PCR SCREENING: MRSA by PCR: NEGATIVE

## 2018-09-13 MED ORDER — MAGNESIUM OXIDE 400 (241.3 MG) MG PO TABS
400.0000 mg | ORAL_TABLET | Freq: Every day | ORAL | Status: DC
  Administered 2018-09-13 – 2018-09-16 (×4): 400 mg via ORAL
  Filled 2018-09-13 (×4): qty 1

## 2018-09-13 MED ORDER — INSULIN ASPART 100 UNIT/ML ~~LOC~~ SOLN
0.0000 [IU] | Freq: Three times a day (TID) | SUBCUTANEOUS | Status: DC
  Administered 2018-09-13: 1 [IU] via SUBCUTANEOUS
  Administered 2018-09-13: 2 [IU] via SUBCUTANEOUS
  Administered 2018-09-13: 1 [IU] via SUBCUTANEOUS
  Administered 2018-09-15 – 2018-09-16 (×2): 3 [IU] via SUBCUTANEOUS

## 2018-09-13 MED ORDER — LINAGLIPTIN 5 MG PO TABS
5.0000 mg | ORAL_TABLET | Freq: Every day | ORAL | Status: DC
  Administered 2018-09-13 – 2018-09-16 (×4): 5 mg via ORAL
  Filled 2018-09-13 (×4): qty 1

## 2018-09-13 MED ORDER — INSULIN ASPART 100 UNIT/ML ~~LOC~~ SOLN: 0.0000 [IU] | Freq: Every day | SUBCUTANEOUS | Status: DC

## 2018-09-13 MED ORDER — ALBUTEROL SULFATE HFA 108 (90 BASE) MCG/ACT IN AERS: 1.0000 | INHALATION_SPRAY | Freq: Four times a day (QID) | RESPIRATORY_TRACT | Status: DC | PRN

## 2018-09-13 MED ORDER — HEPARIN SODIUM (PORCINE) 5000 UNIT/ML IJ SOLN
5000.0000 [IU] | Freq: Three times a day (TID) | INTRAMUSCULAR | Status: DC
  Administered 2018-09-13 – 2018-09-16 (×11): 5000 [IU] via SUBCUTANEOUS
  Filled 2018-09-13 (×14): qty 1

## 2018-09-13 MED ORDER — SODIUM CHLORIDE 0.9 % IV SOLN
1.0000 g | INTRAVENOUS | Status: DC
  Administered 2018-09-13 – 2018-09-14 (×2): 1 g via INTRAVENOUS
  Filled 2018-09-13: qty 1
  Filled 2018-09-13: qty 10
  Filled 2018-09-13: qty 1

## 2018-09-13 MED ORDER — ASPIRIN EC 81 MG PO TBEC
81.0000 mg | DELAYED_RELEASE_TABLET | Freq: Every day | ORAL | Status: DC
  Administered 2018-09-13 – 2018-09-16 (×4): 81 mg via ORAL
  Filled 2018-09-13 (×4): qty 1

## 2018-09-13 MED ORDER — FOLIC ACID 1 MG PO TABS
1000.0000 ug | ORAL_TABLET | Freq: Every day | ORAL | Status: DC
  Administered 2018-09-13 – 2018-09-16 (×4): 1 mg via ORAL
  Filled 2018-09-13 (×4): qty 1

## 2018-09-13 MED ORDER — AMLODIPINE BESYLATE 5 MG PO TABS
5.0000 mg | ORAL_TABLET | Freq: Every day | ORAL | Status: DC
  Administered 2018-09-13 – 2018-09-16 (×4): 5 mg via ORAL
  Filled 2018-09-13 (×4): qty 1

## 2018-09-13 MED ORDER — ONDANSETRON HCL 4 MG PO TABS: 4.0000 mg | ORAL_TABLET | Freq: Four times a day (QID) | ORAL | Status: DC | PRN

## 2018-09-13 MED ORDER — VITAMIN B-12 1000 MCG PO TABS
1000.0000 ug | ORAL_TABLET | Freq: Every day | ORAL | Status: DC
  Administered 2018-09-13 – 2018-09-16 (×4): 1000 ug via ORAL
  Filled 2018-09-13 (×4): qty 1

## 2018-09-13 MED ORDER — SODIUM CHLORIDE 0.45 % IV SOLN
INTRAVENOUS | Status: DC
  Administered 2018-09-13 – 2018-09-14 (×3): via INTRAVENOUS

## 2018-09-13 MED ORDER — SENNOSIDES-DOCUSATE SODIUM 8.6-50 MG PO TABS
2.0000 | ORAL_TABLET | Freq: Two times a day (BID) | ORAL | Status: DC
  Administered 2018-09-13 – 2018-09-16 (×4): 2 via ORAL
  Filled 2018-09-13 (×5): qty 2

## 2018-09-13 MED ORDER — DARIFENACIN HYDROBROMIDE ER 7.5 MG PO TB24
7.5000 mg | ORAL_TABLET | Freq: Every day | ORAL | Status: DC
  Filled 2018-09-13 (×2): qty 1

## 2018-09-13 MED ORDER — PANTOPRAZOLE SODIUM 40 MG PO TBEC
40.0000 mg | DELAYED_RELEASE_TABLET | Freq: Every day | ORAL | Status: DC
  Administered 2018-09-13 – 2018-09-16 (×4): 40 mg via ORAL
  Filled 2018-09-13 (×4): qty 1

## 2018-09-13 MED ORDER — ONDANSETRON HCL 4 MG/2ML IJ SOLN: 4.0000 mg | Freq: Four times a day (QID) | INTRAMUSCULAR | Status: DC | PRN

## 2018-09-13 MED ORDER — LORATADINE 10 MG PO TABS
10.0000 mg | ORAL_TABLET | Freq: Every day | ORAL | Status: DC
  Administered 2018-09-13 – 2018-09-16 (×4): 10 mg via ORAL
  Filled 2018-09-13 (×4): qty 1

## 2018-09-13 MED ORDER — FAMOTIDINE 20 MG PO TABS
20.0000 mg | ORAL_TABLET | Freq: Every day | ORAL | Status: DC
  Administered 2018-09-13 – 2018-09-15 (×4): 20 mg via ORAL
  Filled 2018-09-13 (×4): qty 1

## 2018-09-13 MED ORDER — VITAMIN D 25 MCG (1000 UNIT) PO TABS
5000.0000 [IU] | ORAL_TABLET | Freq: Every day | ORAL | Status: DC
  Administered 2018-09-13 – 2018-09-16 (×4): 5000 [IU] via ORAL
  Filled 2018-09-13 (×4): qty 5

## 2018-09-13 MED ORDER — POLYETHYLENE GLYCOL 3350 17 G PO PACK: 17.0000 g | PACK | Freq: Every day | ORAL | Status: DC | PRN

## 2018-09-13 MED ORDER — POTASSIUM CHLORIDE CRYS ER 20 MEQ PO TBCR
40.0000 meq | EXTENDED_RELEASE_TABLET | Freq: Once | ORAL | Status: AC
  Administered 2018-09-13: 40 meq via ORAL
  Filled 2018-09-13: qty 2

## 2018-09-13 MED ORDER — POLYVINYL ALCOHOL 1.4 % OP SOLN
2.0000 [drp] | OPHTHALMIC | Status: DC | PRN
  Administered 2018-09-13 – 2018-09-15 (×3): 2 [drp] via OPHTHALMIC
  Filled 2018-09-13 (×2): qty 15

## 2018-09-13 MED ORDER — ALBUTEROL SULFATE (2.5 MG/3ML) 0.083% IN NEBU: 2.5000 mg | INHALATION_SOLUTION | Freq: Four times a day (QID) | RESPIRATORY_TRACT | Status: DC | PRN

## 2018-09-13 MED ORDER — FERROUS GLUCONATE 324 (38 FE) MG PO TABS
324.0000 mg | ORAL_TABLET | Freq: Every day | ORAL | Status: DC
  Administered 2018-09-13 – 2018-09-16 (×4): 324 mg via ORAL
  Filled 2018-09-13 (×4): qty 1

## 2018-09-13 MED ORDER — HYDROCHLOROTHIAZIDE 10 MG/ML ORAL SUSPENSION
6.2500 mg | Freq: Every day | ORAL | Status: DC
  Administered 2018-09-13: 6.25 mg via ORAL
  Filled 2018-09-13 (×2): qty 1.25

## 2018-09-13 MED ORDER — ACETAMINOPHEN 500 MG PO TABS
500.0000 mg | ORAL_TABLET | Freq: Four times a day (QID) | ORAL | Status: DC | PRN
  Administered 2018-09-14 – 2018-09-15 (×3): 500 mg via ORAL
  Filled 2018-09-13 (×3): qty 1

## 2018-09-13 NOTE — ED Notes (Signed)
ED TO INPATIENT HANDOFF REPORT  Name/Age/Gender Heidi Dominguez 85 y.o. female  Code Status Code Status History    Date Active Date Inactive Code Status Order ID Comments User Context   08/27/2018 0405 09/04/2018 0029 Full Code [id number]  Rise Patience, MD ED   08/23/2018 0541 08/25/2018 1741 Full Code [id number]  Rise Patience, MD ED   07/04/2018 0947 07/06/2018 1959 Full Code [id number]  Karmen Bongo, MD ED   04/08/2015 0351 04/13/2015 1826 Full Code [id number]  Rise Patience, MD Inpatient   08/07/2012 1218 08/12/2012 2053 Full Code [id number]  Eugenie Filler, MD Inpatient   02/26/2012 2056 02/29/2012 2249 Full Code [id number]  Doyle Askew, RN ED   09/24/2011 1732 09/30/2011 1633 Full Code [id number]  Rise Patience, RN Inpatient      Home/SNF/Other Home  Chief Complaint N-V, Decreased urine output  Level of Care/Admitting Diagnosis ED Disposition    ED Disposition Condition [place] [facility] Area: [facility] [[id number]]  Level of Care: Med-Surg [16]  Diagnosis: UTI (urinary tract infection) [160109]  Admitting Physician: Elwyn Reach [[id number]]  Attending Physician: Elwyn Reach [[id number]]  Estimated length of stay: past midnight tomorrow  Certification:: I certify this patient will need inpatient services for at least 2 midnights  PT Class (Do Not Modify): Inpatient [101]  PT Acc Code (Do Not Modify): Private [1]       Medical History Past Medical History:  Diagnosis Date  . Arthritis    "qwhere" (07/04/2018)  . Chronic back pain    "all over" (07/04/2018)  . Chronic diastolic CHF (congestive heart failure) ([place])   . Complication of anesthesia    "I have a hard time waking up"  . Degenerative joint disease of shoulder region   . Family history of adverse reaction to anesthesia    "daughter has the shakes when she wakes up"  . GERD (gastroesophageal reflux disease)   . Heart murmur   . High cholesterol   .  History of hiatal hernia   . HOH (hard of hearing)   . Hypertension   . Osteoarthritis   . Phlebitis    "BLE"  . Type II diabetes mellitus (HCC)     Allergies Allergies  Allergen Reactions  . Catapres [Clonidine Hcl] Other (See Comments)    Unknown. Unknown.  . Codeine Other (See Comments)    "Makes me out of my head, loopy"  . Covera-Hs [Verapamil Hcl] Other (See Comments)    Unknown.  Marland Kitchen Hydrocodone Other (See Comments)    "makes me go out of my head, loopy"  . Other Other (See Comments)    Any type of narcotics Feels "loopy" Any type of narcotics Feels "loopy"  . Sulfa Antibiotics Itching  . Sulfamethoxazole Diarrhea  . Verapamil Diarrhea and Other (See Comments)  . Sulfasalazine Itching    IV Location/Drains/Wounds Patient Lines/Drains/Airways Status   Active Line/Drains/Airways    Name:   Placement date:   Placement time:   Site:   Days:   Peripheral IV 09/12/18 Left Wrist   09/12/18    2100    Wrist   1   External Urinary Catheter   08/27/18    0600    -   17   GI Stent 5 Fr.   08/30/18    0855    -   14          Labs/Imaging Results for orders placed or performed during  the hospital encounter of 09/12/18 (from the past 48 hour(s))  Comprehensive metabolic panel     Status: Abnormal   Collection Time: 09/12/18  7:07 PM  Result Value Ref Range   Sodium 134 (L) 135 - 145 mmol/L   Potassium 4.2 3.5 - 5.1 mmol/L   Chloride 97 (L) 98 - 111 mmol/L   CO2 28 22 - 32 mmol/L   Glucose, Bld 195 (H) 70 - 99 mg/dL   BUN 16 8 - 23 mg/dL   Creatinine, Ser 0.69 0.44 - 1.00 mg/dL   Calcium 9.1 8.9 - 10.3 mg/dL   Total Protein 6.8 6.5 - 8.1 g/dL   Albumin 2.8 (L) 3.5 - 5.0 g/dL   AST 20 15 - 41 U/L   ALT 17 0 - 44 U/L   Alkaline Phosphatase 177 (H) 38 - 126 U/L   Total Bilirubin 0.6 0.3 - 1.2 mg/dL   GFR calc non Af Amer >60 >60 mL/min   GFR calc Af Amer >60 >60 mL/min   Anion gap 9 5 - 15    Comment: Performed at [facility], [place] [address] [place], [place] [address]  Lipase, blood     Status: None   Collection Time: 09/12/18  7:07 PM  Result Value Ref Range   Lipase 38 11 - 51 U/L    Comment: Performed at [facility]: [place] [address], [place] [address], [place], [place] [address]  CBC with Differential     Status: Abnormal   Collection Time: 09/12/18  7:07 PM  Result Value Ref Range   WBC 13.3 (H) 4.0 - 10.5 K/uL   RBC 4.39 3.87 - 5.11 MIL/uL   Hemoglobin 13.2 12.0 - 15.0 g/dL   HCT 42.9 36.0 - 46.0 %   MCV 97.7 80.0 - 100.0 fL   MCH 30.1 26.0 - 34.0 pg   MCHC 30.8 30.0 - 36.0 g/dL   RDW 13.5 11.5 - 15.5 %   Platelets 246 150 - 400 K/uL   nRBC 0.0 0.0 - 0.2 %   Neutrophils Relative % 85 %   Neutro Abs 11.4 (H) 1.7 - 7.7 K/uL   Lymphocytes Relative 6 %   Lymphs Abs 0.9 0.7 - 4.0 K/uL   Monocytes Relative 7 %   Monocytes Absolute 0.9 0.1 - 1.0 K/uL   Eosinophils Relative 1 %   Eosinophils Absolute 0.1 0.0 - 0.5 K/uL   Basophils Relative 0 %   Basophils Absolute 0.0 0.0 - 0.1 K/uL   Immature Granulocytes 1 %   Abs Immature Granulocytes 0.09 (H) 0.00 - 0.07 K/uL    Comment: Performed at [facility], [place] [address] [place], [place] [address]  Urinalysis, Routine w reflex microscopic     Status: Abnormal   Collection Time: 09/12/18  8:44 PM  Result Value Ref Range   Color, Urine YELLOW YELLOW   APPearance CLOUDY (A) CLEAR   Specific Gravity, Urine 1.008 1.005 - 1.030   pH 6.0 5.0 - 8.0   Glucose, UA NEGATIVE NEGATIVE mg/dL   Hgb urine dipstick MODERATE (A) NEGATIVE   Bilirubin Urine NEGATIVE NEGATIVE   Ketones, ur NEGATIVE NEGATIVE mg/dL   Protein, ur NEGATIVE NEGATIVE mg/dL   Nitrite NEGATIVE NEGATIVE   Leukocytes, UA LARGE (A) NEGATIVE   RBC / HPF 21-50 0 - 5 RBC/hpf   WBC, UA >50 (H) 0 - 5 WBC/hpf   Bacteria, UA MANY (A) NONE SEEN   Squamous Epithelial / LPF 0-5 0 - 5  WBC Clumps PRESENT    Mucus PRESENT     Comment: Performed at [facility], [place] [address] [place], [place] [address]  I-stat troponin, ED     Status: None   Collection Time: 09/12/18  8:44 PM  Result Value Ref Range   Troponin i, poc 0.00 0.00 - 0.08 ng/mL   Comment 3            Comment: Due to the release kinetics of cTnI, a negative result within the first hours of the onset of symptoms does not rule out myocardial infarction with certainty. If myocardial infarction is still suspected, repeat the test at appropriate intervals.    Dg Abdomen 1 View  Result Date: 09/12/2018 CLINICAL DATA:  Nausea and vomiting. Decreased urine output and constipation for 24 hours. EXAM: ABDOMEN - 1 VIEW COMPARISON:  MRI abdomen 08/27/2018. CT abdomen and pelvis 08/23/2018. Abdomen 08/23/2018. FINDINGS: Gas and stool throughout the colon. No small or large bowel distention is seen. No radiopaque stones. Surgical clips in the right upper quadrant. Stent in the left upper quadrant may be in the pancreas. Patient positioning limits evaluation. Lumbar scoliosis convex towards the right with degenerative changes and kyphoplasty changes. Vascular calcifications. IMPRESSION: Nonobstructive bowel gas pattern. Electronically Signed   By: Lucienne Capers M.D.   On: 09/12/2018 22:20   Dg Chest Port 1 View  Result Date: 09/12/2018 CLINICAL DATA:  Vomiting. EXAM: PORTABLE CHEST 1 VIEW COMPARISON:  07/04/2018 and prior chest radiographs FINDINGS: Patient is rotated and leaning to the LEFT. Cardiomegaly identified. No definite acute abnormalities noted. There is no evidence of focal airspace disease, pulmonary edema, suspicious pulmonary nodule/mass, pleural effusion, or pneumothorax. No acute bony abnormalities are identified. Bilateral humeral prosthesis again noted. IMPRESSION: Cardiomegaly without evidence of acute cardiopulmonary disease. Electronically Signed   By: Margarette Canada M.D.   On: 09/12/2018 19:42    Pending Labs Unresulted Labs (From admission, onward)    Start     Ordered   09/12/18 1907  Urine  culture  ONCE - STAT,   STAT     09/12/18 1907   Signed and Held  CBC  (heparin)  Once,   R    Comments:  Baseline for heparin therapy IF NOT ALREADY DRAWN.  Notify MD if PLT < 100 K.    Signed and Held   Signed and Held  Creatinine, serum  (heparin)  Once,   R    Comments:  Baseline for heparin therapy IF NOT ALREADY DRAWN.    Signed and Held   Signed and Held  Comprehensive metabolic panel  Tomorrow morning,   R     Signed and Held   Signed and Held  CBC  Tomorrow morning,   R     Signed and Held          Vitals/Pain Today's Vitals   09/12/18 1945 09/12/18 2043 09/12/18 2230 09/13/18 0000  BP:  (!) 153/72 137/85 133/62  Pulse:  84 84 78  Resp:  20 (!) 21 (!) 21  Temp:      TempSrc:      SpO2:  93% 93% 92%  Weight:      Height:      PainSc: 0-No pain       Isolation Precautions No active isolations  Medications Medications  cefTRIAXone (ROCEPHIN) 1 g in sodium chloride 0.9 % 100 mL IVPB (0 g Intravenous Stopped 09/12/18 2314)    Mobility walks with device

## 2018-09-13 NOTE — Progress Notes (Signed)
PROGRESS NOTE    Heidi Dominguez  ZOX:[id number] DOB: [birth year] DOA: 09/12/2018 PCP: Lajean Manes, MD   Brief Narrative: Heidi Dominguez is a 85 y.o. female with medical history significant of recent biliary colic with stent placement, significant arthritis, diabetes, chronic diastolic CHF, GERD, hypertension, hyperlipidemia and poor hearing who was brought to the ER with complaint of nausea and decreased urine output all day. In the ER urinalysis is consistent with UTI.   Assessment & Plan:   Principal Problem:   UTI (urinary tract infection) Active Problems:   Weakness   Essential hypertension   CKD (chronic kidney disease) stage 3, GFR 30-59 ml/min (HCC)    UTi:  Continue with rocephin and wait for urine cultures.  Discussed with daughter at bedside.   Generalized weakness:  PT/ot evaluation ordered.    Chronic rotater cuff tear on the right : s/p cortisone inj 10 days ago.  Plan for outpatient follow up with Dr Elmyra Ricks in one week.    H/o Gall stone pancreatitis s/p biliary stent X rays revealing stent in colon.  Discussed with DR [facility], will repeat X rAY in 1 to 2 days and recommend follow up with Dr Watt Climes as outpatient.    Stage 3 CKD: Creatinine at baseline.    DVT prophylaxis:Heparin.  Code Status: full code.  Family Communication: discussed with daughter at bedside.  Disposition Plan: pending PT eval.   Consultants:  Dr Watt Climes  Procedures:  None  Antimicrobials: Rocephin since admission.   Subjective: Some shoulder pain.   Objective: Vitals:   09/12/18 2230 09/13/18 0000 09/13/18 0129 09/13/18 0607  BP: 137/85 133/62 (!) 156/61 (!) 152/60  Pulse: 84 78 75 75  Resp: (!) 21 (!) 21 15 18   Temp:   98.4 F (36.9 C) 98.2 F (36.8 C)  TempSrc:   Oral Oral  SpO2: 93% 92% 96% 93%  Weight:      Height:        Intake/Output Summary (Last 24 hours) at 09/13/2018 1316 Last data filed at 09/13/2018 1030 Gross per 24 hour  Intake 769.42 ml  Output  750 ml  Net 19.42 ml   Filed Weights   09/12/18 1840  Weight: 76.5 kg    Examination:  General exam: Appears calm and comfortable  Respiratory system: Clear to auscultation. Respiratory effort normal. Cardiovascular system: S1 & S2 heard, RRR. No JVD,  Gastrointestinal system: Abdomen is nondistended, soft and nontender. Normal bowel sounds heard. Central nervous system: Alert and oriented. No focal neurological deficits. Extremities: right RUE in sling.  Skin: No rashes, lesions or ulcers Psychiatry: Mood & affect appropriate.     Data Reviewed: I have personally reviewed following labs and imaging studies  CBC: Recent Labs  Lab 09/12/18 1907 09/13/18 0326  WBC 13.3* 7.8  NEUTROABS 11.4*  --   HGB 13.2 11.7*  HCT 42.9 38.8  MCV 97.7 98.0  PLT 246 811   Basic Metabolic Panel: Recent Labs  Lab 09/12/18 1907 09/13/18 0326  NA 134* 134*  K 4.2 3.4*  CL 97* 101  CO2 28 26  GLUCOSE 195* 131*  BUN 16 12  CREATININE 0.69 0.45  CALCIUM 9.1 7.8*   GFR: Estimated Creatinine Clearance: 42.1 mL/min (by C-G formula based on SCr of 0.45 mg/dL). Liver Function Tests: Recent Labs  Lab 09/12/18 1907 09/13/18 0326  AST 20 15  ALT 17 14  ALKPHOS 177* 134*  BILITOT 0.6 0.9  PROT 6.8 5.3*  ALBUMIN 2.8* 2.1*   Recent  Labs  Lab 09/12/18 1907  LIPASE 38   No results for input(s): AMMONIA in the last 168 hours. Coagulation Profile: No results for input(s): INR, PROTIME in the last 168 hours. Cardiac Enzymes: No results for input(s): CKTOTAL, CKMB, CKMBINDEX, TROPONINI in the last 168 hours. BNP (last 3 results) No results for input(s): PROBNP in the last 8760 hours. HbA1C: No results for input(s): HGBA1C in the last 72 hours. CBG: Recent Labs  Lab 09/13/18 0129 09/13/18 0718 09/13/18 1121  GLUCAP 131* 126* 195*   Lipid Profile: No results for input(s): CHOL, HDL, LDLCALC, TRIG, CHOLHDL, LDLDIRECT in the last 72 hours. Thyroid Function Tests: No results  for input(s): TSH, T4TOTAL, FREET4, T3FREE, THYROIDAB in the last 72 hours. Anemia Panel: No results for input(s): VITAMINB12, FOLATE, FERRITIN, TIBC, IRON, RETICCTPCT in the last 72 hours. Sepsis Labs: No results for input(s): PROCALCITON, LATICACIDVEN in the last 168 hours.  Recent Results (from the past 240 hour(s))  MRSA PCR Screening     Status: None   Collection Time: 09/13/18  6:09 AM  Result Value Ref Range Status   MRSA by PCR NEGATIVE NEGATIVE Final    Comment:        The GeneXpert MRSA Assay (FDA approved for NASAL specimens only), is one component of a comprehensive MRSA colonization surveillance program. It is not intended to diagnose MRSA infection nor to guide or monitor treatment for MRSA infections. Performed at [facility], [place] [address] [place], [place] [address]          Radiology Studies: Dg Abdomen 1 View  Result Date: 09/12/2018 CLINICAL DATA:  Nausea and vomiting. Decreased urine output and constipation for 24 hours. EXAM: ABDOMEN - 1 VIEW COMPARISON:  MRI abdomen 08/27/2018. CT abdomen and pelvis 08/23/2018. Abdomen 08/23/2018. FINDINGS: Gas and stool throughout the colon. No small or large bowel distention is seen. No radiopaque stones. Surgical clips in the right upper quadrant. Stent in the left upper quadrant may be in the pancreas. Patient positioning limits evaluation. Lumbar scoliosis convex towards the right with degenerative changes and kyphoplasty changes. Vascular calcifications. IMPRESSION: Nonobstructive bowel gas pattern. Electronically Signed   By: Lucienne Capers M.D.   On: 09/12/2018 22:20   Dg Chest Port 1 View  Result Date: 09/12/2018 CLINICAL DATA:  Vomiting. EXAM: PORTABLE CHEST 1 VIEW COMPARISON:  07/04/2018 and prior chest radiographs FINDINGS: Patient is rotated and leaning to the LEFT. Cardiomegaly identified. No definite acute abnormalities noted. There is no evidence of focal airspace disease, pulmonary  edema, suspicious pulmonary nodule/mass, pleural effusion, or pneumothorax. No acute bony abnormalities are identified. Bilateral humeral prosthesis again noted. IMPRESSION: Cardiomegaly without evidence of acute cardiopulmonary disease. Electronically Signed   By: Margarette Canada M.D.   On: 09/12/2018 19:42        Scheduled Meds: . amLODipine  5 mg Oral Daily  . aspirin EC  81 mg Oral Daily  . cholecalciferol  5,000 Units Oral Daily  . darifenacin  7.5 mg Oral Daily  . famotidine  20 mg Oral QHS  . ferrous gluconate  324 mg Oral Daily  . folic acid  7,902 mcg Oral Daily  . heparin  5,000 Units Subcutaneous Q8H  . hydrochlorothiazide  6.25 mg Oral Daily  . insulin aspart  0-5 Units Subcutaneous QHS  . insulin aspart  0-9 Units Subcutaneous TID WC  . linagliptin  5 mg Oral Daily  . loratadine  10 mg Oral Daily  . magnesium oxide  400 mg Oral  Daily  . pantoprazole  40 mg Oral Daily  . senna-docusate  2 tablet Oral BID  . vitamin B-12  1,000 mcg Oral Daily   Continuous Infusions: . sodium chloride 75 mL/hr at 09/13/18 0600  . cefTRIAXone (ROCEPHIN)  IV       LOS: 1 day    Time spent: 18 min    Hosie Poisson, MD [facility] Pager [phone]  If 7PM-7AM, please contact night-coverage [email or web address] Password [email or web address] 09/13/2018, 1:16 PM

## 2018-09-13 NOTE — Progress Notes (Signed)
I discussed case with primary team and appears stent is in colon, not in pancreas.  Please repeat x ray on Monday and call me with results- or if discharged repeat x ray in one week.

## 2018-09-13 NOTE — Patient Outreach (Signed)
[facility]) Care Management  09/13/2018  Heidi Dominguez [birth year] [id number]   Attended interdisciplinary team meeting at [facility] and [place] with discharge Ross to discuss patient's progress and plan for transitioning to home. SW states this patient's discharge plan is to return home where she lives alone.  SW states patient has family included in her care and also has a hired Building control surveyor.  No discharge date set as of yet.  Went to room 604 to speak with patient.  Patient and son [name] in room.  Introduced Psychologist, forensic and gave patient packet with contact information included.  Patient confirmed she does live alone but has a caregiver who comes in from 9a-3p who assists with meals and transportation.  Patient states she is working with therapy but not standing on her own yet.  Patient stated her daughter [name] is assisting her with her discharge plan. Patient gave verbal permission to speak with daughter. Lakeyshia [phone]  Patient agreeable to services.   Referral placed for [place] manager to engage in transition of care.   Plan to make [facility] aware of [facility] CM referral.   Ariya Bohannon RN, [place] Acute Care Coordinator [phone]) Business Mobile [phone]) Toll free office

## 2018-09-14 LAB — GLUCOSE, CAPILLARY
Glucose-Capillary: 94 mg/dL (ref 70–99)
Glucose-Capillary: 164 mg/dL — ABNORMAL HIGH (ref 70–99)
Glucose-Capillary: 124 mg/dL — ABNORMAL HIGH (ref 70–99)
Glucose-Capillary: 130 mg/dL — ABNORMAL HIGH (ref 70–99)

## 2018-09-14 LAB — BASIC METABOLIC PANEL
Anion gap: 7 (ref 5–15)
BUN: 8 mg/dL (ref 8–23)
CO2: 30 mmol/L (ref 22–32)
Calcium: 9.1 mg/dL (ref 8.9–10.3)
Chloride: 102 mmol/L (ref 98–111)
Creatinine, Ser: 0.59 mg/dL (ref 0.44–1.00)
GFR calc Af Amer: >60 mL/min (ref >60)
Glucose, Bld: 99 mg/dL (ref 70–99)
POTASSIUM: 4.1 mmol/L (ref 3.5–5.1)
Sodium: 139 mmol/L (ref 135–145)

## 2018-09-14 NOTE — Plan of Care (Signed)
Pt is stable. No acute distress. Complete care rendered.

## 2018-09-14 NOTE — Progress Notes (Signed)
PROGRESS NOTE    Heidi Dominguez  KXF:[id number] DOB: 1921-05-30 DOA: 09/12/2018 PCP: Lajean Manes, MD   Brief Narrative: Heidi Dominguez is a 85 y.o. female with medical history significant of recent biliary colic with stent placement, significant arthritis, diabetes, chronic diastolic CHF, GERD, hypertension, hyperlipidemia and poor hearing who was brought to the ER with complaint of nausea and decreased urine output all day. In the ER urinalysis is consistent with UTI.   Assessment & Plan:   Principal Problem:   UTI (urinary tract infection) Active Problems:   Weakness   Essential hypertension   CKD (chronic kidney disease) stage 3, GFR 30-59 ml/min (HCC)    UTI:  Continue with rocephin and wait for urine cultures. Urine cultures show 80,000 gram neg rods. Identification and sensitivities are pending.  Discussed with daughter at bedside.   Generalized weakness:  PT/OT evaluation ordered.    Chronic rotater cuff tear on the right : s/p cortisone inj 10 days ago.  Will request orthopedics consult from Dr Veverly Fells.    H/o Gall stone pancreatitis s/p biliary stent X rays revealing stent in colon.  Discussed with DR [facility], will repeat X rAY in 1 to 2 days and recommend follow up with Dr Watt Climes as outpatient.    Stage 3 CKD: Creatinine at baseline.    DVT prophylaxis:Heparin.  Code Status: full code.  Family Communication: discussed with daughter at bedside.  Disposition Plan: pending PT eval.   Consultants:  Dr Watt Climes  Procedures:  None  Antimicrobials: Rocephin since admission.   Subjective: Some shoulder pain. Pt reports feeling much better today. No issues.   Objective: Vitals:   09/13/18 0607 09/13/18 1520 09/13/18 2211 09/14/18 1404  BP: (!) 152/60 (!) 142/73 127/61 (!) 151/67  Pulse: 75 81 77 81  Resp: 18 20 16 15   Temp: 98.2 F (36.8 C) 99.4 F (37.4 C) 98.2 F (36.8 C) 98.3 F (36.8 C)  TempSrc: Oral Oral Oral Oral  SpO2: 93% 94% 95% 97%    Weight:      Height:        Intake/Output Summary (Last 24 hours) at 09/14/2018 1709 Last data filed at 09/14/2018 1544 Gross per 24 hour  Intake 2844.42 ml  Output 3875 ml  Net -1030.58 ml   Filed Weights   09/12/18 1840  Weight: 76.5 kg    Examination:  General exam: Appears calm and comfortable , not in distress.  Respiratory system: Clear to auscultation. Respiratory effort normal. No wheezing or rhonchi.  Cardiovascular system: S1 & S2 heard, RRR. No JVD,  Gastrointestinal system: Abdomen is nondistended, soft and nontender. Normal bowel sounds heard. Central nervous system: Alert and oriented. No focal neurological deficits. Extremities: right RUE in sling.  Skin: No rashes, lesions or ulcers Psychiatry: Mood & affect appropriate.     Data Reviewed: I have personally reviewed following labs and imaging studies  CBC: Recent Labs  Lab 09/12/18 1907 09/13/18 0326  WBC 13.3* 7.8  NEUTROABS 11.4*  --   HGB 13.2 11.7*  HCT 42.9 38.8  MCV 97.7 98.0  PLT 246 696   Basic Metabolic Panel: Recent Labs  Lab 09/12/18 1907 09/13/18 0326 09/14/18 0503  NA 134* 134* 139  K 4.2 3.4* 4.1  CL 97* 101 102  CO2 28 26 30   GLUCOSE 195* 131* 99  BUN 16 12 8   CREATININE 0.69 0.45 0.59  CALCIUM 9.1 7.8* 9.1   GFR: Estimated Creatinine Clearance: 42.1 mL/min (by C-G formula based on SCr  of 0.59 mg/dL). Liver Function Tests: Recent Labs  Lab 09/12/18 1907 09/13/18 0326  AST 20 15  ALT 17 14  ALKPHOS 177* 134*  BILITOT 0.6 0.9  PROT 6.8 5.3*  ALBUMIN 2.8* 2.1*   Recent Labs  Lab 09/12/18 1907  LIPASE 38   No results for input(s): AMMONIA in the last 168 hours. Coagulation Profile: No results for input(s): INR, PROTIME in the last 168 hours. Cardiac Enzymes: No results for input(s): CKTOTAL, CKMB, CKMBINDEX, TROPONINI in the last 168 hours. BNP (last 3 results) No results for input(s): PROBNP in the last 8760 hours. HbA1C: No results for input(s): HGBA1C in  the last 72 hours. CBG: Recent Labs  Lab 09/13/18 1121 09/13/18 1659 09/13/18 2213 09/14/18 0747 09/14/18 1233  GLUCAP 195* 130* 92 94 164*   Lipid Profile: No results for input(s): CHOL, HDL, LDLCALC, TRIG, CHOLHDL, LDLDIRECT in the last 72 hours. Thyroid Function Tests: No results for input(s): TSH, T4TOTAL, FREET4, T3FREE, THYROIDAB in the last 72 hours. Anemia Panel: No results for input(s): VITAMINB12, FOLATE, FERRITIN, TIBC, IRON, RETICCTPCT in the last 72 hours. Sepsis Labs: No results for input(s): PROCALCITON, LATICACIDVEN in the last 168 hours.  Recent Results (from the past 240 hour(s))  Urine culture     Status: Abnormal (Preliminary result)   Collection Time: 09/12/18  8:44 PM  Result Value Ref Range Status   Specimen Description   Final    URINE, RANDOM Performed at [facility] [address] [place], [place] [address]    Special Requests   Final    NONE Performed at [facility], [place] [address], [place], [place] [address]    Culture 80,000 COLONIES/mL PSEUDOMONAS AERUGINOSA (A)  Final   Report Status PENDING  Incomplete  MRSA PCR Screening     Status: None   Collection Time: 09/13/18  6:09 AM  Result Value Ref Range Status   MRSA by PCR NEGATIVE NEGATIVE Final    Comment:        The GeneXpert MRSA Assay (FDA approved for NASAL specimens only), is one component of a comprehensive MRSA colonization surveillance program. It is not intended to diagnose MRSA infection nor to guide or monitor treatment for MRSA infections. Performed at [facility], [place] [address], [place], [place] [address]          Radiology Studies: Dg Abdomen 1 View  Result Date: 09/12/2018 CLINICAL DATA:  Nausea and vomiting. Decreased urine output and constipation for 24 hours. EXAM: ABDOMEN - 1 VIEW COMPARISON:  MRI abdomen 08/27/2018. CT abdomen and pelvis 08/23/2018. Abdomen 08/23/2018. FINDINGS: Gas and stool  throughout the colon. No small or large bowel distention is seen. No radiopaque stones. Surgical clips in the right upper quadrant. Stent in the left upper quadrant may be in the pancreas. Patient positioning limits evaluation. Lumbar scoliosis convex towards the right with degenerative changes and kyphoplasty changes. Vascular calcifications. IMPRESSION: Nonobstructive bowel gas pattern. Electronically Signed   By: Lucienne Capers M.D.   On: 09/12/2018 22:20   Dg Chest Port 1 View  Result Date: 09/12/2018 CLINICAL DATA:  Vomiting. EXAM: PORTABLE CHEST 1 VIEW COMPARISON:  07/04/2018 and prior chest radiographs FINDINGS: Patient is rotated and leaning to the LEFT. Cardiomegaly identified. No definite acute abnormalities noted. There is no evidence of focal airspace disease, pulmonary edema, suspicious pulmonary nodule/mass, pleural effusion, or pneumothorax. No acute bony abnormalities are identified. Bilateral humeral prosthesis again noted. IMPRESSION: Cardiomegaly without evidence of acute cardiopulmonary disease.  Electronically Signed   By: Margarette Canada M.D.   On: 09/12/2018 19:42        Scheduled Meds: . amLODipine  5 mg Oral Daily  . aspirin EC  81 mg Oral Daily  . cholecalciferol  5,000 Units Oral Daily  . famotidine  20 mg Oral QHS  . ferrous gluconate  324 mg Oral Daily  . folic acid  6,440 mcg Oral Daily  . heparin  5,000 Units Subcutaneous Q8H  . insulin aspart  0-5 Units Subcutaneous QHS  . insulin aspart  0-9 Units Subcutaneous TID WC  . linagliptin  5 mg Oral Daily  . loratadine  10 mg Oral Daily  . magnesium oxide  400 mg Oral Daily  . pantoprazole  40 mg Oral Daily  . senna-docusate  2 tablet Oral BID  . vitamin B-12  1,000 mcg Oral Daily   Continuous Infusions: . cefTRIAXone (ROCEPHIN)  IV 1 g (09/13/18 2241)     LOS: 2 days    Time spent: 28 min    Hosie Poisson, MD [facility] Pager [phone]  If 7PM-7AM, please contact  night-coverage [email or web address] Password [email or web address] 09/14/2018, 5:09 PM

## 2018-09-15 LAB — URINE CULTURE: Culture: 80000 — AB

## 2018-09-15 LAB — GLUCOSE, CAPILLARY
Glucose-Capillary: 113 mg/dL — ABNORMAL HIGH (ref 70–99)
Glucose-Capillary: 200 mg/dL — ABNORMAL HIGH (ref 70–99)
Glucose-Capillary: 217 mg/dL — ABNORMAL HIGH (ref 70–99)
Glucose-Capillary: 119 mg/dL — ABNORMAL HIGH (ref 70–99)

## 2018-09-15 MED ORDER — SODIUM CHLORIDE 0.9 % IV SOLN
2.0000 g | Freq: Two times a day (BID) | INTRAVENOUS | Status: DC
  Administered 2018-09-15 – 2018-09-16 (×2): 2 g via INTRAVENOUS
  Filled 2018-09-15 (×3): qty 2

## 2018-09-15 MED ORDER — KETOROLAC TROMETHAMINE 15 MG/ML IJ SOLN: 7.5000 mg | Freq: Three times a day (TID) | INTRAMUSCULAR | Status: AC | PRN

## 2018-09-15 NOTE — Evaluation (Signed)
Physical Therapy Evaluation Patient Details Name: Heidi Dominguez MRN: [id number] DOB: [birth year] Today's Date: 09/15/2018   History of Present Illness  PATIENT WAS ADMITTED FOR UTI. PT HAS R UE FX WITHOUT SX.  PMH significant for CHF, gallstones hypertension, hyperlipidemia, COPD. ERCP on 08/30/18.  Clinical Impression  Pt admitted as above and presenting with functional mobility limitations 2* generalized weakness, balance deficits and NWB R UE.  Pt would benefit from follow up rehab at SNF level to maximize IND and safety.    Follow Up Recommendations SNF;Supervision/Assistance - 24 hour    Equipment Recommendations  None recommended by PT    Recommendations for Other Services OT consult     Precautions / Restrictions Precautions Precautions: Fall Required Braces or Orthoses: Sling Restrictions Weight Bearing Restrictions: No Other Position/Activity Restrictions: NWB ON R UE      Mobility  Bed Mobility Overal bed mobility: Needs Assistance Bed Mobility: Supine to Sit     Supine to sit: Mod assist     General bed mobility comments: pt initiated movement however required assist for trunk upright and scooting to EOB, utilized bed pad  Transfers Overall transfer level: Needs assistance Equipment used: Rolling walker (2 wheeled) Transfers: Sit to/from [employer] Sit to Stand: Mod assist;+2 physical assistance Stand pivot transfers: Mod assist;+2 physical assistance       General transfer comment: Short shuffling steps with assist of 2 for support and balance - pt with posterior drift  Ambulation/Gait Ambulation/Gait assistance: Mod assist;+2 physical assistance;+2 safety/equipment Gait Distance (Feet): 2 Feet Assistive device: 1 person hand held assist Gait Pattern/deviations: Step-to pattern;Decreased step length - right;Decreased step length - left;Shuffle;Trunk flexed;Wide base of support;Leaning posteriorly Gait velocity: decr   General Gait  Details: short shuffling steps with assist of 2 for balance/support; Pt states she tried QC but with poor results  Stairs            Wheelchair Mobility    Modified Rankin (Stroke Patients Only)       Balance Overall balance assessment: Needs assistance Sitting-balance support: Feet supported;No upper extremity supported Sitting balance-Leahy Scale: Fair   Postural control: Posterior lean Standing balance support: Single extremity supported Standing balance-Leahy Scale: Poor                               Pertinent Vitals/Pain Pain Assessment: Faces Faces Pain Scale: Hurts little more Pain Location: R SHLD Pain Descriptors / Indicators: Aching;Discomfort;Grimacing Pain Intervention(s): Limited activity within patient's tolerance;Monitored during session;Heat applied    Home Living Family/patient expects to be discharged to:: Unsure                      Prior Function Level of [place]: Needs assistance         Comments: Pt states she was able to care for self and ambulating with RW prior to recent fall and R UE fx     Hand Dominance   Dominant Hand: Right    Extremity/Trunk Assessment   Upper Extremity Assessment Upper Extremity Assessment: RUE deficits/detail RUE Deficits / Details: PNT R UE IS IMMOBILIZED IN SLING LUE Deficits / Details: HX OF ROTATOR CUFF SX AND HAS DECREAED ROM    Lower Extremity Assessment Lower Extremity Assessment: Generalized weakness    Cervical / Trunk Assessment Cervical / Trunk Assessment: Kyphotic  Communication   Communication: No difficulties  Cognition Arousal/Alertness: Awake/alert Behavior During Therapy: WFL for tasks assessed/performed  Overall Cognitive Status: Within Functional Limits for tasks assessed                                        General Comments      Exercises     Assessment/Plan    PT Assessment Patient needs continued PT services  PT Problem  List Decreased strength;Decreased activity tolerance;Decreased balance;Decreased mobility;Decreased coordination;Decreased knowledge of use of DME;Decreased safety awareness       PT Treatment Interventions DME instruction;Gait training;Functional mobility training;Therapeutic activities;Therapeutic exercise;Balance training;Patient/family education    PT Goals (Current goals can be found in the Care Plan section)  Acute Rehab PT Goals Patient Stated Goal: Get my arm better so I can use my walker again PT Goal Formulation: With patient Time For Goal Achievement: 09/13/18 Potential to Achieve Goals: Fair    Frequency Min 2X/week   Barriers to discharge        Co-evaluation PT/OT/SLP Co-Evaluation/Treatment: Yes Reason for Co-Treatment: For patient/therapist safety PT goals addressed during session: Mobility/safety with mobility OT goals addressed during session: ADL's and self-care       AM-PAC PT "6 Clicks" Mobility  Outcome Measure Help needed turning from your back to your side while in a flat bed without using bedrails?: A Lot Help needed moving from lying on your back to sitting on the side of a flat bed without using bedrails?: A Lot Help needed moving to and from a bed to a chair (including a wheelchair)?: A Lot Help needed standing up from a chair using your arms (e.g., wheelchair or bedside chair)?: A Lot Help needed to walk in hospital room?: A Lot Help needed climbing 3-5 steps with a railing? : Total 6 Click Score: 11    End of Session Equipment Utilized During Treatment: Gait belt Activity Tolerance: Patient tolerated treatment well;Patient limited by fatigue Patient left: in chair;with call bell/phone within reach;with nursing/sitter in room Nurse Communication: Mobility status PT Visit Diagnosis: Unsteadiness on feet (R26.81);Other abnormalities of gait and mobility (R26.89);Muscle weakness (generalized) (M62.81)    Time: 4782-9562 PT Time Calculation (min)  (ACUTE ONLY): 26 min   Charges:   PT Evaluation $PT Eval Moderate Complexity: 1 Mod          Mauro Kaufmann PT Acute Rehabilitation Services Pager [phone] Office [phone]   Gerlean Cid 09/15/2018, 2:58 PM

## 2018-09-15 NOTE — Progress Notes (Signed)
Pharmacy Antibiotic Note  MCCARTNEY BRUCKS is a 85 y.o. female presented to the ED on 09/12/2018 with c/o nausea and decreased urine output.  Patient was started on ceftriaxone on admission for suspected UTI.  UCX on 1/30 is positive for PsA.  Abx changed to cefepime on 2/2 for PsA UTI.  Plan: - cefepime 2gm IV q12h  __________________________________________  Height: 5\' 4"  (162.6 cm) Weight: 168 lb 10.4 oz (76.5 kg) IBW/kg (Calculated) : 54.7  Temp (24hrs), Avg:98.2 F (36.8 C), Min:98 F (36.7 C), Max:98.3 F (36.8 C)  Recent Labs  Lab 09/12/18 1907 09/13/18 0326 09/14/18 0503  WBC 13.3* 7.8  --   CREATININE 0.69 0.45 0.59    Estimated Creatinine Clearance: 42.1 mL/min (by C-G formula based on SCr of 0.59 mg/dL).    Allergies  Allergen Reactions  . Catapres [Clonidine Hcl] Other (See Comments)    Unknown. Unknown.  . Codeine Other (See Comments)    "Makes me out of my head, loopy"  . Covera-Hs [Verapamil Hcl] Other (See Comments)    Unknown.  Marland Kitchen Hydrocodone Other (See Comments)    "makes me go out of my head, loopy"  . Other Other (See Comments)    Any type of narcotics Feels "loopy" Any type of narcotics Feels "loopy"  . Sulfa Antibiotics Itching  . Sulfamethoxazole Diarrhea  . Verapamil Diarrhea and Other (See Comments)  . Sulfasalazine Itching    Antimicrobials this admission:  1/31 CTX>> 2/1 2/2 cefepime>>  Microbiology results:  1/30 UCx: 80K PsA (I= gent; S= cefep, ceftaz, cipro, imi, zosyn) 1/31 MRSA PCR: neg  Thank you for allowing pharmacy to be a part of this patient's care.  Lynelle Doctor 09/15/2018 11:34 AM

## 2018-09-15 NOTE — Progress Notes (Signed)
PROGRESS NOTE    Heidi Dominguez  HQI:[id number] DOB: 07/04/1921 DOA: 09/12/2018 PCP: Lajean Manes, MD   Brief Narrative: Heidi Dominguez is a 85 y.o. female with medical history significant of recent biliary colic with stent placement, significant arthritis, diabetes, chronic diastolic CHF, GERD, hypertension, hyperlipidemia and poor hearing who was brought to the ER with complaint of nausea and decreased urine output all day. In the ER urinalysis is consistent with UTI.   Assessment & Plan:   Principal Problem:   UTI (urinary tract infection) Active Problems:   Weakness   Essential hypertension   CKD (chronic kidney disease) stage 3, GFR 30-59 ml/min (HCC)    Pseudomonas UTI:  Urine cultures revealed 80,000 pseudomonas, changed rocephin to cefepime. Plan to transition to fluroquinolones on discharge.   Generalized weakness:  PT/OT evaluation ordered. SNF on discharge.    Chronic rotater cuff tear on the right : s/p cortisone inj 10 days ago.  Discussed with Dr Alvan Dame who is covering Dr Veverly Fells this weekend, recommended outpatient follow up with DR Veverly Fells in the office.    H/o Gall stone pancreatitis s/p biliary stent X rays revealing stent in colon.  Discussed with DR [facility], will repeat X rAY in 1 to 2 days and recommend follow up with Dr Watt Climes as outpatient.    Stage 3 CKD: Creatinine at baseline.    DVT prophylaxis:Heparin.  Code Status: full code.  Family Communication: discussed with daughter at bedside on 09/14/2018.  Disposition Plan: SNF on discharge.   Consultants:  Dr Watt Climes  Procedures:  None  Antimicrobials: Rocephin since admission.   Subjective: Persistent shoulder pain improved with ice and heat.   Objective: Vitals:   09/13/18 2211 09/14/18 1404 09/14/18 2118 09/15/18 1355  BP: 127/61 (!) 151/67 (!) 167/89 (!) 141/61  Pulse: 77 81 82 76  Resp: 16 15 16 14   Temp: 98.2 F (36.8 C) 98.3 F (36.8 C) 98 F (36.7 C) 98.1 F (36.7 C)  TempSrc:  Oral Oral Oral Oral  SpO2: 95% 97% 99% 97%  Weight:      Height:        Intake/Output Summary (Last 24 hours) at 09/15/2018 1549 Last data filed at 09/15/2018 1319 Gross per 24 hour  Intake 1380 ml  Output 1200 ml  Net 180 ml   Filed Weights   09/12/18 1840  Weight: 76.5 kg    Examination:  General exam: no distress noted.  Respiratory system: Clear to auscultation. Respiratory effort normal. No wheezing or rhonchi.  Cardiovascular system: S1 & S2 heard, RRR. No JVD,  Gastrointestinal system: Abdomen is soft NT nd bs+ Central nervous system: Alert and oriented. No focal neurological deficits. Extremities: right RUE in sling.  Skin: No rashes, lesions or ulcers Psychiatry: Mood & affect appropriate.     Data Reviewed: I have personally reviewed following labs and imaging studies  CBC: Recent Labs  Lab 09/12/18 1907 09/13/18 0326  WBC 13.3* 7.8  NEUTROABS 11.4*  --   HGB 13.2 11.7*  HCT 42.9 38.8  MCV 97.7 98.0  PLT 246 132   Basic Metabolic Panel: Recent Labs  Lab 09/12/18 1907 09/13/18 0326 09/14/18 0503  NA 134* 134* 139  K 4.2 3.4* 4.1  CL 97* 101 102  CO2 28 26 30   GLUCOSE 195* 131* 99  BUN 16 12 8   CREATININE 0.69 0.45 0.59  CALCIUM 9.1 7.8* 9.1   GFR: Estimated Creatinine Clearance: 42.1 mL/min (by C-G formula based on SCr of 0.59 mg/dL).  Liver Function Tests: Recent Labs  Lab 09/12/18 1907 09/13/18 0326  AST 20 15  ALT 17 14  ALKPHOS 177* 134*  BILITOT 0.6 0.9  PROT 6.8 5.3*  ALBUMIN 2.8* 2.1*   Recent Labs  Lab 09/12/18 1907  LIPASE 38   No results for input(s): AMMONIA in the last 168 hours. Coagulation Profile: No results for input(s): INR, PROTIME in the last 168 hours. Cardiac Enzymes: No results for input(s): CKTOTAL, CKMB, CKMBINDEX, TROPONINI in the last 168 hours. BNP (last 3 results) No results for input(s): PROBNP in the last 8760 hours. HbA1C: No results for input(s): HGBA1C in the last 72 hours. CBG: Recent Labs    Lab 09/14/18 1233 09/14/18 1736 09/14/18 2120 09/15/18 0732 09/15/18 1205  GLUCAP 164* 124* 130* 113* 200*   Lipid Profile: No results for input(s): CHOL, HDL, LDLCALC, TRIG, CHOLHDL, LDLDIRECT in the last 72 hours. Thyroid Function Tests: No results for input(s): TSH, T4TOTAL, FREET4, T3FREE, THYROIDAB in the last 72 hours. Anemia Panel: No results for input(s): VITAMINB12, FOLATE, FERRITIN, TIBC, IRON, RETICCTPCT in the last 72 hours. Sepsis Labs: No results for input(s): PROCALCITON, LATICACIDVEN in the last 168 hours.  Recent Results (from the past 240 hour(s))  Urine culture     Status: Abnormal   Collection Time: 09/12/18  8:44 PM  Result Value Ref Range Status   Specimen Description   Final    URINE, RANDOM Performed at [facility] [address] [place], [place] [address]    Special Requests   Final    NONE Performed at [facility], [place] [address], [place], [place] [address]    Culture 80,000 COLONIES/mL PSEUDOMONAS AERUGINOSA (A)  Final   Report Status 09/15/2018 FINAL  Final   Organism ID, Bacteria PSEUDOMONAS AERUGINOSA (A)  Final      Susceptibility   Pseudomonas aeruginosa - MIC*    CEFTAZIDIME 8 SENSITIVE Sensitive     CIPROFLOXACIN 0.5 SENSITIVE Sensitive     GENTAMICIN 8 INTERMEDIATE Intermediate     IMIPENEM 2 SENSITIVE Sensitive     PIP/TAZO <=4 SENSITIVE Sensitive     CEFEPIME 4 SENSITIVE Sensitive     * 80,000 COLONIES/mL PSEUDOMONAS AERUGINOSA  MRSA PCR Screening     Status: None   Collection Time: 09/13/18  6:09 AM  Result Value Ref Range Status   MRSA by PCR NEGATIVE NEGATIVE Final    Comment:        The GeneXpert MRSA Assay (FDA approved for NASAL specimens only), is one component of a comprehensive MRSA colonization surveillance program. It is not intended to diagnose MRSA infection nor to guide or monitor treatment for MRSA infections. Performed at [facility], [place]  [address], [place], [place] [address]          Radiology Studies: No results found.      Scheduled Meds: . amLODipine  5 mg Oral Daily  . aspirin EC  81 mg Oral Daily  . cholecalciferol  5,000 Units Oral Daily  . famotidine  20 mg Oral QHS  . ferrous gluconate  324 mg Oral Daily  . folic acid  2,500 mcg Oral Daily  . heparin  5,000 Units Subcutaneous Q8H  . insulin aspart  0-5 Units Subcutaneous QHS  . insulin aspart  0-9 Units Subcutaneous TID WC  . linagliptin  5 mg Oral Daily  . loratadine  10 mg Oral Daily  . magnesium oxide  400 mg Oral Daily  . pantoprazole  40 mg Oral Daily  . senna-docusate  2 tablet Oral BID  . vitamin B-12  1,000 mcg Oral Daily   Continuous Infusions: . ceFEPime (MAXIPIME) IV Stopped (09/15/18 1303)     LOS: 3 days    Time spent: 28 min    Hosie Poisson, MD [facility] Pager [phone]  If 7PM-7AM, please contact night-coverage [email or web address] Password [facility] 09/15/2018, 3:49 PM

## 2018-09-15 NOTE — Evaluation (Signed)
Occupational Therapy Evaluation Patient Details Name: PALESTINE GOVAN MRN: [id number] DOB: [birth year] Today's Date: 09/15/2018    History of Present Illness PATIENT WAS ADMITTED FOR UTI. PNT HAS S R UE FX WITHOUT SX.  PMH significant for CHF, gallstones hypertension, hyperlipidemia, COPD. ERCP on 08/30/18.   Clinical Impression   PATIENT HAS DECREASED I AND SAETY WITH ADLS AND MOBILTY. PATIENT REQRUIRES FUTHER OT TO MAXIMIZE I AND SAFETY WITH ADLS AND ADL TRANSFERS. ACUTE OF TO FOLLOW. PNT WLL NEED SNF VOS EXTENSIVE ASSIST AT HOME.     Follow Up Recommendations  SNF    Equipment Recommendations       Recommendations for Other Services       Precautions / Restrictions Precautions Precautions: Fall Required Braces or Orthoses: Sling Restrictions Weight Bearing Restrictions: No Other Position/Activity Restrictions: NWB ON R UE      Mobility Bed Mobility         Supine to sit: Max assist        Transfers       Sit to Stand: Mod assist;+2 physical assistance Stand pivot transfers: Mod assist;+2 physical assistance            Balance                                           ADL either performed or assessed with clinical judgement   ADL Overall ADL's : Needs assistance/impaired Eating/Feeding: Set up   Grooming: Wash/dry hands;Wash/dry face;Oral care;Brushing hair;Moderate assistance   Upper Body Bathing: Maximal assistance   Lower Body Bathing: Maximal assistance   Upper Body Dressing : Maximal assistance   Lower Body Dressing: Total assistance   Toilet Transfer: Moderate assistance;+2 for physical assistance   Toileting- Clothing Manipulation and Hygiene: Maximal assistance       Functional mobility during ADLs: Moderate assistance;+2 for physical assistance General ADL Comments: MAX TO TOTAL ASSIST WTIH ADLS.     Vision Baseline Vision/History: Wears glasses;Legally blind;Macular Degeneration Wears Glasses: At all  times Patient Visual Report: No change from baseline       Perception     Praxis      Pertinent Vitals/Pain Faces Pain Scale: Hurts little more Pain Location: R SHLD Pain Intervention(s): Heat applied     Hand Dominance Right   Extremity/Trunk Assessment Upper Extremity Assessment Upper Extremity Assessment: RUE deficits/detail;LUE deficits/detail RUE Deficits / Details: PNT R UE IS IMMOBILIZED IN SLING LUE Deficits / Details: HX OF ROTATOR CUFF SX AND HAS DECREAED ROM           Communication Communication Communication: No difficulties   Cognition Arousal/Alertness: Awake/alert Behavior During Therapy: WFL for tasks assessed/performed Overall Cognitive Status: Within Functional Limits for tasks assessed                                     General Comments       Exercises     Shoulder Instructions      Home Living Family/patient expects to be discharged to:: Unsure                                        Prior Functioning/Environment Level of Independence: Independent with assistive device(s)  Comments: PATIENT STATES SHE WAS TAKING CARE OF HERSELF BEFORE FX.        OT Problem List:        OT Treatment/Interventions: Self-care/ADL training;DME and/or AE instruction;Patient/family education    OT Goals(Current goals can be found in the care plan section) Acute Rehab OT Goals Patient Stated Goal: GET BETTER OT Goal Formulation: With patient Time For Goal Achievement: 09/29/18 Potential to Achieve Goals: Good ADL Goals Pt Will Perform Grooming: (P) with min assist Pt Will Perform Upper Body Bathing: (P) with min assist Pt Will Perform Lower Body Bathing: (P) with min assist Pt Will Perform Upper Body Dressing: (P) with min assist Pt Will Perform Lower Body Dressing: (P) with mod assist Pt Will Transfer to Toilet: (P) with min assist  OT Frequency: Min 2X/week   Barriers to D/C:            Co-evaluation               AM-PAC OT "6 Clicks" Daily Activity     Outcome Measure Help from another person eating meals?: A Little Help from another person taking care of personal grooming?: A Lot Help from another person toileting, which includes using toliet, bedpan, or urinal?: Total Help from another person bathing (including washing, rinsing, drying)?: A Lot Help from another person to put on and taking off regular upper body clothing?: A Lot Help from another person to put on and taking off regular lower body clothing?: Total 6 Click Score: 11   End of Session Equipment Utilized During Treatment: Gait belt Nurse Communication: (OK THERAY)  Activity Tolerance: Patient tolerated treatment well Patient left: in chair;with call bell/phone within reach                   Time: 1136-1205 OT Time Calculation (min): 29 min Charges:  OT General Charges $OT Visit: 1 Visit OT Evaluation $OT Eval Moderate Complexity: 1 Mod  6 CLICKS  Lasean Gorniak 09/15/2018, 12:30 PM

## 2018-09-15 NOTE — Plan of Care (Signed)
Pt remains stable with no needs. No changes to note at this time. No changes needed to care plans.

## 2018-09-16 ENCOUNTER — Inpatient Hospital Stay (HOSPITAL_COMMUNITY): Payer: Medicare Other

## 2018-09-16 DIAGNOSIS — N390 Urinary tract infection, site not specified: Secondary | ICD-10-CM | POA: Diagnosis not present

## 2018-09-16 DIAGNOSIS — I130 Hypertensive heart and chronic kidney disease with heart failure and stage 1 through stage 4 chronic kidney disease, or unspecified chronic kidney disease: Secondary | ICD-10-CM | POA: Diagnosis not present

## 2018-09-16 DIAGNOSIS — T85520D Displacement of bile duct prosthesis, subsequent encounter: Secondary | ICD-10-CM

## 2018-09-16 DIAGNOSIS — Z7982 Long term (current) use of aspirin: Secondary | ICD-10-CM | POA: Diagnosis not present

## 2018-09-16 DIAGNOSIS — Z7984 Long term (current) use of oral hypoglycemic drugs: Secondary | ICD-10-CM | POA: Diagnosis not present

## 2018-09-16 DIAGNOSIS — R600 Localized edema: Secondary | ICD-10-CM | POA: Diagnosis not present

## 2018-09-16 DIAGNOSIS — B9620 Unspecified Escherichia coli [E. coli] as the cause of diseases classified elsewhere: Secondary | ICD-10-CM | POA: Diagnosis present

## 2018-09-16 DIAGNOSIS — T8149XA Infection following a procedure, other surgical site, initial encounter: Secondary | ICD-10-CM | POA: Diagnosis not present

## 2018-09-16 DIAGNOSIS — D519 Vitamin B12 deficiency anemia, unspecified: Secondary | ICD-10-CM | POA: Diagnosis not present

## 2018-09-16 DIAGNOSIS — I5032 Chronic diastolic (congestive) heart failure: Secondary | ICD-10-CM | POA: Diagnosis not present

## 2018-09-16 DIAGNOSIS — M79674 Pain in right toe(s): Secondary | ICD-10-CM | POA: Diagnosis not present

## 2018-09-16 DIAGNOSIS — R5381 Other malaise: Secondary | ICD-10-CM | POA: Diagnosis not present

## 2018-09-16 DIAGNOSIS — M25511 Pain in right shoulder: Secondary | ICD-10-CM | POA: Diagnosis not present

## 2018-09-16 DIAGNOSIS — E559 Vitamin D deficiency, unspecified: Secondary | ICD-10-CM | POA: Diagnosis not present

## 2018-09-16 DIAGNOSIS — I10 Essential (primary) hypertension: Secondary | ICD-10-CM | POA: Diagnosis not present

## 2018-09-16 DIAGNOSIS — L84 Corns and callosities: Secondary | ICD-10-CM | POA: Diagnosis not present

## 2018-09-16 DIAGNOSIS — Z79899 Other long term (current) drug therapy: Secondary | ICD-10-CM | POA: Diagnosis not present

## 2018-09-16 DIAGNOSIS — K8050 Calculus of bile duct without cholangitis or cholecystitis without obstruction: Secondary | ICD-10-CM | POA: Diagnosis not present

## 2018-09-16 DIAGNOSIS — R109 Unspecified abdominal pain: Secondary | ICD-10-CM | POA: Diagnosis not present

## 2018-09-16 DIAGNOSIS — Z471 Aftercare following joint replacement surgery: Secondary | ICD-10-CM | POA: Diagnosis not present

## 2018-09-16 DIAGNOSIS — H04129 Dry eye syndrome of unspecified lacrimal gland: Secondary | ICD-10-CM | POA: Diagnosis present

## 2018-09-16 DIAGNOSIS — E7800 Pure hypercholesterolemia, unspecified: Secondary | ICD-10-CM | POA: Diagnosis not present

## 2018-09-16 DIAGNOSIS — N183 Chronic kidney disease, stage 3 (moderate): Secondary | ICD-10-CM | POA: Diagnosis not present

## 2018-09-16 DIAGNOSIS — Z741 Need for assistance with personal care: Secondary | ICD-10-CM | POA: Diagnosis not present

## 2018-09-16 DIAGNOSIS — B351 Tinea unguium: Secondary | ICD-10-CM | POA: Diagnosis not present

## 2018-09-16 DIAGNOSIS — N3000 Acute cystitis without hematuria: Secondary | ICD-10-CM | POA: Diagnosis not present

## 2018-09-16 DIAGNOSIS — Z9049 Acquired absence of other specified parts of digestive tract: Secondary | ICD-10-CM | POA: Diagnosis not present

## 2018-09-16 DIAGNOSIS — R1902 Left upper quadrant abdominal swelling, mass and lump: Secondary | ICD-10-CM | POA: Diagnosis not present

## 2018-09-16 DIAGNOSIS — E1122 Type 2 diabetes mellitus with diabetic chronic kidney disease: Secondary | ICD-10-CM | POA: Diagnosis not present

## 2018-09-16 DIAGNOSIS — Z9842 Cataract extraction status, left eye: Secondary | ICD-10-CM | POA: Diagnosis not present

## 2018-09-16 DIAGNOSIS — H6123 Impacted cerumen, bilateral: Secondary | ICD-10-CM | POA: Diagnosis not present

## 2018-09-16 DIAGNOSIS — Z888 Allergy status to other drugs, medicaments and biological substances status: Secondary | ICD-10-CM | POA: Diagnosis not present

## 2018-09-16 DIAGNOSIS — Z96653 Presence of artificial knee joint, bilateral: Secondary | ICD-10-CM | POA: Diagnosis not present

## 2018-09-16 DIAGNOSIS — E119 Type 2 diabetes mellitus without complications: Secondary | ICD-10-CM | POA: Diagnosis not present

## 2018-09-16 DIAGNOSIS — N182 Chronic kidney disease, stage 2 (mild): Secondary | ICD-10-CM | POA: Diagnosis not present

## 2018-09-16 DIAGNOSIS — I509 Heart failure, unspecified: Secondary | ICD-10-CM | POA: Diagnosis not present

## 2018-09-16 DIAGNOSIS — E1142 Type 2 diabetes mellitus with diabetic polyneuropathy: Secondary | ICD-10-CM | POA: Diagnosis not present

## 2018-09-16 DIAGNOSIS — Z885 Allergy status to narcotic agent status: Secondary | ICD-10-CM | POA: Diagnosis not present

## 2018-09-16 DIAGNOSIS — N3281 Overactive bladder: Secondary | ICD-10-CM | POA: Diagnosis not present

## 2018-09-16 DIAGNOSIS — S4381XD Sprain of other specified parts of right shoulder girdle, subsequent encounter: Secondary | ICD-10-CM | POA: Diagnosis not present

## 2018-09-16 DIAGNOSIS — Z882 Allergy status to sulfonamides status: Secondary | ICD-10-CM | POA: Diagnosis not present

## 2018-09-16 DIAGNOSIS — Z961 Presence of intraocular lens: Secondary | ICD-10-CM | POA: Diagnosis present

## 2018-09-16 DIAGNOSIS — D529 Folate deficiency anemia, unspecified: Secondary | ICD-10-CM | POA: Diagnosis not present

## 2018-09-16 DIAGNOSIS — H04123 Dry eye syndrome of bilateral lacrimal glands: Secondary | ICD-10-CM | POA: Diagnosis not present

## 2018-09-16 DIAGNOSIS — M79675 Pain in left toe(s): Secondary | ICD-10-CM | POA: Diagnosis not present

## 2018-09-16 DIAGNOSIS — M2550 Pain in unspecified joint: Secondary | ICD-10-CM | POA: Diagnosis not present

## 2018-09-16 DIAGNOSIS — M19011 Primary osteoarthritis, right shoulder: Secondary | ICD-10-CM | POA: Diagnosis not present

## 2018-09-16 DIAGNOSIS — R2689 Other abnormalities of gait and mobility: Secondary | ICD-10-CM | POA: Diagnosis not present

## 2018-09-16 DIAGNOSIS — Z9841 Cataract extraction status, right eye: Secondary | ICD-10-CM | POA: Diagnosis not present

## 2018-09-16 DIAGNOSIS — K851 Biliary acute pancreatitis without necrosis or infection: Secondary | ICD-10-CM | POA: Diagnosis not present

## 2018-09-16 DIAGNOSIS — K219 Gastro-esophageal reflux disease without esophagitis: Secondary | ICD-10-CM | POA: Diagnosis not present

## 2018-09-16 DIAGNOSIS — Z96611 Presence of right artificial shoulder joint: Secondary | ICD-10-CM | POA: Diagnosis not present

## 2018-09-16 DIAGNOSIS — M6281 Muscle weakness (generalized): Secondary | ICD-10-CM | POA: Diagnosis not present

## 2018-09-16 DIAGNOSIS — H9190 Unspecified hearing loss, unspecified ear: Secondary | ICD-10-CM | POA: Diagnosis not present

## 2018-09-16 DIAGNOSIS — Z8672 Personal history of thrombophlebitis: Secondary | ICD-10-CM | POA: Diagnosis not present

## 2018-09-16 DIAGNOSIS — Z7401 Bed confinement status: Secondary | ICD-10-CM | POA: Diagnosis not present

## 2018-09-16 DIAGNOSIS — R1312 Dysphagia, oropharyngeal phase: Secondary | ICD-10-CM | POA: Diagnosis not present

## 2018-09-16 DIAGNOSIS — Z90710 Acquired absence of both cervix and uterus: Secondary | ICD-10-CM | POA: Diagnosis not present

## 2018-09-16 DIAGNOSIS — T8459XA Infection and inflammatory reaction due to other internal joint prosthesis, initial encounter: Secondary | ICD-10-CM | POA: Diagnosis not present

## 2018-09-16 DIAGNOSIS — R945 Abnormal results of liver function studies: Secondary | ICD-10-CM | POA: Diagnosis not present

## 2018-09-16 LAB — GLUCOSE, CAPILLARY
Glucose-Capillary: 104 mg/dL — ABNORMAL HIGH (ref 70–99)
Glucose-Capillary: 211 mg/dL — ABNORMAL HIGH (ref 70–99)

## 2018-09-16 MED ORDER — SODIUM CHLORIDE 0.9 % IV SOLN
INTRAVENOUS | Status: DC | PRN
  Administered 2018-09-16: 500 mL via INTRAVENOUS

## 2018-09-16 MED ORDER — SODIUM CHLORIDE 0.9 % IV SOLN: 1.0000 g | INTRAVENOUS | Status: DC

## 2018-09-16 MED ORDER — SENNOSIDES-DOCUSATE SODIUM 8.6-50 MG PO TABS: 2.0000 | ORAL_TABLET | Freq: Two times a day (BID) | ORAL | 0 refills | Status: DC

## 2018-09-16 MED ORDER — CIPROFLOXACIN HCL 250 MG PO TABS: 250.0000 mg | ORAL_TABLET | Freq: Two times a day (BID) | ORAL | 0 refills | Status: AC

## 2018-09-16 NOTE — Progress Notes (Signed)
[facility] Gastroenterology Progress Note  Heidi Dominguez 85 y.o. [birth year]   Subjective: Feels good. Denies abd pain/N/V.  Objective: Vital signs: Vitals:   09/16/18 0615 09/16/18 0842  BP: (!) 150/58 (!) 141/54  Pulse: 69 86  Resp: 16   Temp: 98.5 F (36.9 C)   SpO2: 95%     Physical Exam: Gen: alert, no acute distress, elderly, frail HEENT: anicteric sclera CV: RRR Chest: CTA B Abd: soft, nontender, nondistended, +BS Ext: no edema  Lab Results: Recent Labs    09/14/18 0503  NA 139  K 4.1  CL 102  CO2 30  GLUCOSE 99  BUN 8  CREATININE 0.59  CALCIUM 9.1   No results for input(s): AST, ALT, ALKPHOS, BILITOT, PROT, ALBUMIN in the last 72 hours. No results for input(s): WBC, NEUTROABS, HGB, HCT, MCV, PLT in the last 72 hours.    Assessment/Plan: No pancreatic stent seen on Xray so stent has migrated out of the duct and into the colon and has been evacuated. F/U with GI prn. UTI treatment managed by primary team and patient being discharged today.   Lear Ng 09/16/2018, 11:06 AM  Questions please call [phone] ID: Heidi Dominguez, female   DOB: 06-16-1921, 85 y.o.   MRN: [id number]

## 2018-09-16 NOTE — Consult Note (Signed)
   [facility] CM Inpatient Consult   09/16/2018  [name] 09/08/1920 [id number]    Patient screened for potential [facility] Care Management due to multiple hospitalizations. Spoke with [name] at bedside. She reports she is returning to [facility] today.   Discussed that [name] was previously assigned to [facility] for follow up post discharge from SNF. See [facility] Post Acute Care Coordinator's notes from 09/10/2018.  Will update [facility] Post Acute Care Coordinator of Mrs. Schnider's return to SNF and bedside visit.   Marthenia Rolling, MSN-Ed, RN,BSN [facility] Liaison [phone]

## 2018-09-16 NOTE — Progress Notes (Signed)
Patient admitted for nausea and urinary retention.  Patient will return to [facility]  Nurse call report to: [phone] Patient family request to be here when the patient transports. PTAR arranged for 4:00pm pick up.   Kathrin Greathouse, Marlinda Mike, MSW Clinical Social Worker  [phone] 09/16/2018  12:46 PM

## 2018-09-16 NOTE — NC FL2 (Signed)
[place] LEVEL OF CARE SCREENING TOOL     IDENTIFICATION  Patient Name: Heidi Dominguez Birthdate: [birth year] Sex: female Admission Date (Current Location): 09/12/2018  [facility] and [place] Number:  Herbalist and Address:  [facility],  [address] [place], [place]      Provider Number: [id number]  Attending Physician Name and Address:  Hosie Poisson, MD  Relative Name and Phone Number:  Janisa Labus: [phone]    Current Level of Care: Hospital Recommended Level of Care: [place] Prior Approval Number:    Date Approved/Denied:   PASRR Number: [id number]  Discharge Plan: SNF    Current Diagnoses: Patient Active Problem List   Diagnosis Date Noted  . UTI (urinary tract infection) 09/12/2018  . Acute pancreatitis 08/28/2018  . Acute recurrent pancreatitis   . Abdominal pain 08/23/2018  . Elevated LFTs 07/04/2018  . CKD (chronic kidney disease) stage 3, GFR 30-59 ml/min (HCC) 07/04/2018  . Upper airway cough syndrome 03/13/2018  . Sensorineural hearing loss (SNHL), bilateral 12/04/2016  . Post-nasal drainage 03/14/2016  . Presbycusis of both ears 03/14/2016  . Bilateral hearing loss 10/06/2015  . Bilateral impacted cerumen 10/06/2015  . 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 Subjective tinnitus of both ears 10/06/2015  . Choledocholithiasis with chronic cholecystitis, nonobstructing 04/11/2015  . Pre-operative cardiovascular examination, high risk surgery   . Epigastric abdominal pain 04/08/2015  . Acute gallstone pancreatitis s/p lap chole w [facility] 04/11/2015 04/08/2015  . Diabetes mellitus type 2, controlled ([place]) 04/08/2015  . Gallstone pancreatitis 04/08/2015  . Mitral regurgitation 04/08/2015  . Chronic diastolic CHF (congestive heart failure) ([place]) 04/08/2015  . Dyspnea 03/31/2015  . Ischemic colitis ([place]) 08/09/2012  . Colitis 08/07/2012  . Rectal bleeding 08/07/2012  .  Weakness 09/24/2011  . Bradycardia 09/24/2011  . Acute lower UTI 09/24/2011  . Hematochezia 09/24/2011  . Essential hypertension 09/24/2011  . Dyslipidemia 09/24/2011    Orientation RESPIRATION BLADDER Height & Weight     Self, Time, Situation, Place  Normal External catheter Weight: 168 lb 10.4 oz (76.5 kg) Height:  5\' 4"  (162.6 cm)  BEHAVIORAL SYMPTOMS/MOOD NEUROLOGICAL BOWEL NUTRITION STATUS      Incontinent Diet(Low Sodium Heart Healthy )  AMBULATORY STATUS COMMUNICATION OF NEEDS Skin   Extensive Assist Verbally Normal                       Personal Care Assistance Level of Assistance  Bathing, Feeding, Dressing Bathing Assistance: Maximum assistance Feeding assistance: Independent Dressing Assistance: Maximum assistance     Functional Limitations Info  Sight, Hearing, Speech Sight Info: Impaired Hearing Info: Impaired Speech Info: Adequate    SPECIAL CARE FACTORS FREQUENCY  PT (By licensed PT), OT (By licensed OT)     PT Frequency: 5X/WEEK OT Frequency: 5X/WEEK            Contractures Contractures Info: Not present    Additional Factors Info  Code Status, Allergies, Isolation Precautions Code Status Info: fULLCODE  Allergies Info: Allergies: Catapres Clonidine Hcl, Codeine, Covera-hs Verapamil Hcl, Hydrocodone, Other, Sulfa Antibiotics, Sulfamethoxazole, Verapamil, Sulfasalazine           Current Medications (09/16/2018):  This is the current hospital active medication list Current Facility-Administered Medications  Medication Dose Route Frequency Provider Last Rate Last Dose  . 0.9 %  sodium chloride infusion   Intravenous PRN Hosie Poisson, MD      . acetaminophen (TYLENOL) tablet 500 mg  500 mg Oral Q6H PRN Elwyn Reach, MD   500 mg at 09/15/18 2227  . albuterol (PROVENTIL) (2.5 MG/3ML) 0.083% nebulizer solution 2.5 mg  2.5 mg Nebulization Q6H PRN Gala Romney L, MD      . amLODipine (NORVASC) tablet 5 mg  5 mg Oral Daily Elwyn Reach, MD   5 mg at 09/16/18 0844  . aspirin EC tablet 81 mg  81 mg Oral Daily Elwyn Reach, MD   81 mg at 09/16/18 0843  . [START ON 09/17/2018] ceFEPIme (MAXIPIME) 1 g in sodium chloride 0.9 % 100 mL IVPB  1 g Intravenous Q24H Adrian Saran, [facility]      . cholecalciferol (VITAMIN D3) tablet 5,000 Units  5,000 Units Oral Daily Elwyn Reach, MD   5,000 Units at 09/16/18 [phone]  . famotidine (PEPCID) tablet 20 mg  20 mg Oral QHS Elwyn Reach, MD   20 mg at 09/15/18 2227  . ferrous gluconate (FERGON) tablet 324 mg  324 mg Oral Daily Elwyn Reach, MD   324 mg at 09/16/18 0843  . folic acid (FOLVITE) tablet 1 mg  1,000 mcg Oral Daily Gala Romney L, MD   1 mg at 09/16/18 0844  . heparin injection 5,000 Units  5,000 Units Subcutaneous Q8H Elwyn Reach, MD   5,000 Units at 09/16/18 0636  . insulin aspart (novoLOG) injection 0-5 Units  0-5 Units Subcutaneous QHS Garba, Mohammad L, MD      . insulin aspart (novoLOG) injection 0-9 Units  0-9 Units Subcutaneous TID WC Elwyn Reach, MD   3 Units at 09/15/18 1708  . ketorolac (TORADOL) 15 MG/ML injection 7.5 mg  7.5 mg Intravenous Q8H PRN Hosie Poisson, MD      . linagliptin (TRADJENTA) tablet 5 mg  5 mg Oral Daily Elwyn Reach, MD   5 mg at 09/16/18 0845  . loratadine (CLARITIN) tablet 10 mg  10 mg Oral Daily Elwyn Reach, MD   10 mg at 09/16/18 0844  . magnesium oxide (MAG-OX) tablet 400 mg  400 mg Oral Daily Gala Romney L, MD   400 mg at 09/16/18 0844  . ondansetron (ZOFRAN) tablet 4 mg  4 mg Oral Q6H PRN Elwyn Reach, MD       Or  . ondansetron (ZOFRAN) injection 4 mg  4 mg Intravenous Q6H PRN Gala Romney L, MD      . pantoprazole (PROTONIX) EC tablet 40 mg  40 mg Oral Daily Elwyn Reach, MD   40 mg at 09/16/18 0844  . polyethylene glycol (MIRALAX / GLYCOLAX) packet 17 g  17 g Oral Daily PRN Gala Romney L, MD      . polyvinyl alcohol (LIQUIFILM TEARS) 1.4 % ophthalmic solution 2 drop  2 drop Both  Eyes Q4H PRN Elwyn Reach, MD   2 drop at 09/15/18 [phone]  . senna-docusate (Senokot-S) tablet 2 tablet  2 tablet Oral BID Hosie Poisson, MD   2 tablet at 09/16/18 0844  . vitamin B-12 (CYANOCOBALAMIN) tablet 1,000 mcg  1,000 mcg Oral Daily Elwyn Reach, MD   1,000 mcg at 09/16/18 0086     Discharge Medications: Please see discharge summary for a list of discharge medications.  Relevant Imaging Results:  Relevant Lab Results:   Additional Information SSN: [id number]  Lia Hopping, LCSW

## 2018-09-16 NOTE — Discharge Summary (Signed)
Physician Discharge Summary  Heidi Dominguez:[id number] DOB: 1921/04/18 DOA: 09/12/2018  PCP: Lajean Manes, MD  Admit date: 09/12/2018 Discharge date: 09/16/2018  Admitted From: SNF.  Disposition:  SNF  Recommendations for Outpatient Follow-up:  1. Follow up with PCP in 1-2 weeks 2. Please obtain BMP/CBC in one week Please follow up with Dr Veverly Fells as recommended.   Discharge Condition:stable.  CODE STATUS: full code.  Diet recommendation: Heart Healthy  Brief/Interim Summary: Heidi Dominguez a 85 y.o.femalewith medical history significant ofrecent biliary colic with stent placement, significant arthritis, diabetes, chronic diastolic CHF, GERD, hypertension, hyperlipidemia and poor hearing who was brought to the ER with complaint of nausea and decreased urine output all day. In the ER urinalysis is consistent with UTI.  Discharge Diagnoses:  Principal Problem:   UTI (urinary tract infection) Active Problems:   Weakness   Essential hypertension   CKD (chronic kidney disease) stage 3, GFR 30-59 ml/min (HCC)    Pseudomonas UTI:  Urine cultures revealed 80,000 pseudomonas, changed rocephin to cefepime.  transition to fluroquinolones on discharge .   Generalized weakness:  PT/OT evaluation ordered. SNF on discharge.    Chronic rotater cuff tear on the right : s/p cortisone inj 10 days ago.  Discussed with Dr Alvan Dame who is covering Dr Veverly Fells this weekend, recommended outpatient follow up with DR Veverly Fells in the office.    H/o Gall stone pancreatitis s/p biliary stent Repeat X RAYS does not show any stent .     Stage 3 CKD: Creatinine at baseline.   Discharge Instructions  Discharge Instructions    Diet - low sodium heart healthy   Complete by:  As directed    Discharge instructions   Complete by:  As directed    Please follow up with PCP ino ne week.  Please follow up with Dr Veverly Fells as recommended this week in the office.     Allergies as of 09/16/2018       Reactions   Catapres [clonidine Hcl] Other (See Comments)   Unknown. Unknown.   Codeine Other (See Comments)   "Makes me out of my head, loopy"   Covera-hs [verapamil Hcl] Other (See Comments)   Unknown.   Hydrocodone Other (See Comments)   "makes me go out of my head, loopy"   Other Other (See Comments)   Any type of narcotics Feels "loopy" Any type of narcotics Feels "loopy"   Sulfa Antibiotics Itching   Sulfamethoxazole Diarrhea   Verapamil Diarrhea, Other (See Comments)   Sulfasalazine Itching      Medication List    STOP taking these medications   darifenacin 7.5 MG 24 hr tablet Commonly known as:  ENABLEX     TAKE these medications   acetaminophen 500 MG tablet Commonly known as:  TYLENOL Take 1 tablet (500 mg total) by mouth every 6 (six) hours as needed for mild pain or moderate pain.   albuterol 108 (90 Base) MCG/ACT inhaler Commonly known as:  PROVENTIL HFA;VENTOLIN HFA Inhale into the lungs every 6 (six) hours as needed for wheezing or shortness of breath.   amLODipine 5 MG tablet Commonly known as:  NORVASC Take 1 tablet (5 mg total) by mouth daily.   aspirin 81 MG tablet Take 81 mg by mouth daily.   ciprofloxacin 250 MG tablet Commonly known as:  CIPRO Take 1 tablet (250 mg total) by mouth 2 (two) times daily for 6 days.   famotidine 20 MG tablet Commonly known as:  PEPCID Take 20 mg  by mouth at bedtime.   folic acid 976 MCG tablet Commonly known as:  FOLVITE Take 400 mcg by mouth daily.   hydrochlorothiazide 10 mg/mL Susp Take 0.63 mLs (6.25 mg total) by mouth daily.   Iron 240 (27 Fe) MG Tabs Take 1 tablet by mouth daily.   JANUVIA 50 MG tablet Generic drug:  sitaGLIPtin Take 50 mg by mouth daily.   loratadine 10 MG tablet Commonly known as:  CLARITIN Take 10 mg by mouth daily.   magnesium oxide 400 (241.3 Mg) MG tablet Commonly known as:  MAG-OX Take 1 tablet (400 mg total) by mouth daily.   pantoprazole 40 MG  tablet Commonly known as:  PROTONIX Take 40 mg by mouth daily.   polyethylene glycol packet Commonly known as:  MIRALAX / GLYCOLAX Take 17 g by mouth daily as needed for mild constipation.   senna-docusate 8.6-50 MG tablet Commonly known as:  Senokot-S Take 2 tablets by mouth 2 (two) times daily.   SYSTANE 0.4-0.3 % Soln Generic drug:  Polyethyl Glycol-Propyl Glycol Place 2 drops into both eyes every 4 (four) hours as needed (dry eyes).   vitamin B-12 500 MCG tablet Commonly known as:  CYANOCOBALAMIN Take 1,000 mcg by mouth daily.   VITAMIN D (CHOLECALCIFEROL) PO Take 5,000 Units by mouth daily.       Allergies  Allergen Reactions  . Catapres [Clonidine Hcl] Other (See Comments)    Unknown. Unknown.  . Codeine Other (See Comments)    "Makes me out of my head, loopy"  . Covera-Hs [Verapamil Hcl] Other (See Comments)    Unknown.  Marland Kitchen Hydrocodone Other (See Comments)    "makes me go out of my head, loopy"  . Other Other (See Comments)    Any type of narcotics Feels "loopy" Any type of narcotics Feels "loopy"  . Sulfa Antibiotics Itching  . Sulfamethoxazole Diarrhea  . Verapamil Diarrhea and Other (See Comments)  . Sulfasalazine Itching    Consultations:  Curbside with Dr Alvan Dame and Dr Watt Climes.    Procedures/Studies: Dg Abdomen 1 View  Result Date: 09/12/2018 CLINICAL DATA:  Nausea and vomiting. Decreased urine output and constipation for 24 hours. EXAM: ABDOMEN - 1 VIEW COMPARISON:  MRI abdomen 08/27/2018. CT abdomen and pelvis 08/23/2018. Abdomen 08/23/2018. FINDINGS: Gas and stool throughout the colon. No small or large bowel distention is seen. No radiopaque stones. Surgical clips in the right upper quadrant. Stent in the left upper quadrant may be in the pancreas. Patient positioning limits evaluation. Lumbar scoliosis convex towards the right with degenerative changes and kyphoplasty changes. Vascular calcifications. IMPRESSION: Nonobstructive bowel gas pattern.  Electronically Signed   By: Lucienne Capers M.D.   On: 09/12/2018 22:20   Dg Abdomen 1 View  Result Date: 08/23/2018 CLINICAL DATA:  85 y/o F; generalized abdominal pain and constipation for 2 days. Nausea without vomiting. EXAM: ABDOMEN - 1 VIEW COMPARISON:  07/04/2018 CT abdomen and pelvis. FINDINGS: Normal bowel gas pattern. Right upper quadrant cholecystectomy clips. Chronic compression deformities of lower thoracic and upper lumbar spine, T12-L3. L3 kyphoplasty. IMPRESSION: Normal bowel gas pattern. Electronically Signed   By: Kristine Garbe M.D.   On: 08/23/2018 02:02   Ct Abdomen Pelvis W Contrast  Result Date: 08/23/2018 CLINICAL DATA:  84 y/o F; generalized abdominal pain and constipation for 2 days. Nausea and vomiting. EXAM: CT ABDOMEN AND PELVIS WITH CONTRAST TECHNIQUE: Multidetector CT imaging of the abdomen and pelvis was performed using the standard protocol following bolus administration of intravenous contrast.  CONTRAST:  179mL OMNIPAQUE IOHEXOL 300 MG/ML  SOLN COMPARISON:  07/04/2018 CT abdomen and pelvis FINDINGS: Lower chest: Cardiomegaly. Large hiatal hernia. Coronary, aortic valvular, and mitral annular calcific atherosclerosis. Hepatobiliary: Cholecystectomy. Intra and extrahepatic biliary ductal dilatation with the common bile duct measuring up to 14 mm, stable from prior CT given differences in technique. No focal liver lesion identified. Pancreas: Unremarkable. No pancreatic ductal dilatation or surrounding inflammatory changes. Spleen: Normal in size without focal abnormality. Adrenals/Urinary Tract: Normal adrenal glands. Right kidney lower pole cyst measuring 23 mm. No urinary stone disease or hydronephrosis. Normal bladder. Stomach/Bowel: Stomach is within normal limits. Appendix not identified. No evidence of bowel wall thickening, distention, or inflammatory changes. Sigmoid diverticulosis without findings of acute diverticulitis. Vascular/Lymphatic: Aortic  atherosclerosis. No enlarged abdominal or pelvic lymph nodes. Reproductive: Status post hysterectomy. No adnexal masses. Other: No abdominal wall hernia or abnormality. No abdominopelvic ascites. Musculoskeletal: Lumbar levocurvature with apex at T12-L1. stable T12-L3 compression deformities with L3 augmentation. Stable severe spondylosis with multilevel vacuum phenomenon and prominent facet arthropathy. IMPRESSION: 1. No acute process identified. 2. Large hiatal hernia. 3. Stable extensive intra and extrahepatic biliary ductal dilatation post cholecystectomy, probably compensatory. 4. Sigmoid diverticulosis without findings of acute diverticulitis. 5. Coronary and aortic Atherosclerosis (ICD10-I70.0). Stable cardiomegaly. 6. Stable T12-L3 compression deformities, L3 augmentation, and advanced lumbar spondylosis. Electronically Signed   By: Kristine Garbe M.D.   On: 08/23/2018 03:37   Mr Shoulder Right Wo Contrast  Result Date: 09/02/2018 CLINICAL DATA:  Remote history of right shoulder replacement. Right shoulder pain without known injury. Question rotator cuff tear or fracture. EXAM: MRI OF THE RIGHT SHOULDER WITHOUT CONTRAST TECHNIQUE: Multiplanar, multisequence MR imaging of the shoulder was performed. No intravenous contrast was administered. COMPARISON:  Plain films right shoulder 08/29/2018. FINDINGS: This study is limited by artifact from the patient's shoulder arthroplasty. Artifact reduction techniques were used on the study. Rotator cuff: Not visualized but the humeral head abuts the acromion consistent with chronic rotator cuff tear. Muscles: The supraspinatus, infraspinatus and subscapularis are completely fatty replaced. Biceps long head:  Not visualized due to artifact. Acromioclavicular Joint:  Not visible due to artifact. Glenohumeral Joint: Replaced. Labrum:  Not applicable Bones: No acute abnormality is seen. The distal end of the humeral component is not included on the exam. Other:  None. IMPRESSION: Markedly limited study despite use of artifact reduction techniques. Findings consistent with chronic, complete supraspinatus, infraspinatus and subscapularis tears. No acute bony abnormality. The entire humeral stem could not be included on the exam. Electronically Signed   By: Inge Rise M.D.   On: 09/02/2018 20:06   Mr 3d Recon At Scanner  Result Date: 08/27/2018 CLINICAL DATA:  Abnormal LFTs, pancreatitis suspected EXAM: MRI ABDOMEN WITHOUT AND WITH CONTRAST (INCLUDING MRCP) TECHNIQUE: Multiplanar multisequence MR imaging of the abdomen was performed both before and after the administration of intravenous contrast. Heavily T2-weighted images of the biliary and pancreatic ducts were obtained, and three-dimensional MRCP images were rendered by post processing. CONTRAST:  7 mL Gadovist IV COMPARISON:  CT abdomen/pelvis dated 08/23/2018. MRI abdomen dated 07/04/2018. FINDINGS: Lower chest: Small bilateral pleural effusions. Hepatobiliary: Liver is within normal limits. No suspicious/enhancing hepatic lesions. Status post cholecystectomy. Mild intrahepatic and moderate extrahepatic ductal dilatation, unchanged. Common duct measures 16 mm, unchanged. No choledocholithiasis is seen. Pancreas: Mild peripancreatic inflammatory changes, compatible with acute pancreatitis. No pancreatic necrosis or ductal dilatation. No peripancreatic fluid collection. Spleen:  Within normal limits. Adrenals/Urinary Tract:  Adrenal glands are within normal limits.  2.4 cm posterior interpolar right renal cyst (series 8/image 38). Left kidney is within normal limits. No hydronephrosis. Stomach/Bowel: Stomach is notable for a large hiatal hernia. Visualized bowel is grossly unremarkable. Vascular/Lymphatic:  No evidence of abdominal aortic aneurysm. No suspicious abdominal lymphadenopathy. Other:  No abdominal ascites Musculoskeletal: Degenerative changes of the lumbar spine. Susceptibility artifact related to  prior vertebral augmentation at L3. IMPRESSION: Uncomplicated acute pancreatitis. Status post cholecystectomy. Stable mild intrahepatic and moderate extrahepatic ductal dilatation. No choledocholithiasis is seen. Small bilateral pleural effusions. Electronically Signed   By: Julian Hy M.D.   On: 08/27/2018 19:56   Dg Chest Port 1 View  Result Date: 09/12/2018 CLINICAL DATA:  Vomiting. EXAM: PORTABLE CHEST 1 VIEW COMPARISON:  07/04/2018 and prior chest radiographs FINDINGS: Patient is rotated and leaning to the LEFT. Cardiomegaly identified. No definite acute abnormalities noted. There is no evidence of focal airspace disease, pulmonary edema, suspicious pulmonary nodule/mass, pleural effusion, or pneumothorax. No acute bony abnormalities are identified. Bilateral humeral prosthesis again noted. IMPRESSION: Cardiomegaly without evidence of acute cardiopulmonary disease. Electronically Signed   By: Margarette Canada M.D.   On: 09/12/2018 19:42   Dg Shoulder Right Port  Result Date: 08/29/2018 CLINICAL DATA:  Right shoulder pain. EXAM: PORTABLE RIGHT SHOULDER COMPARISON:  Right shoulder radiographs 07/04/2018 FINDINGS: Right shoulder hemiarthroplasty is present. There is no significant interval change. The shoulder is located. Advanced degenerative changes are noted at the [facility] joint. The right hemithorax is clear. IMPRESSION: Right shoulder hemiarthroplasty.  No acute abnormality. Electronically Signed   By: San Morelle M.D.   On: 08/29/2018 10:54   Dg Ercp Biliary & Pancreatic Ducts  Result Date: 08/30/2018 CLINICAL DATA:  84 year old female undergoing ERCP EXAM: ERCP TECHNIQUE: Multiple spot images obtained with the fluoroscopic device and submitted for interpretation post-procedure. FLUOROSCOPY TIME:  Fluoroscopy Time:  10 minutes 3 seconds reported COMPARISON:  MRI, MRCP 08/27/2018 FINDINGS: A total of 14 intraoperative saved images are submitted for review. The images demonstrate a flexible  endoscope in the descending duodenum. Wire cannulation of the main pancreatic duct is performed. Pancreatic duct Dr. Kingsley Plan demonstrates a mildly dilated main pancreatic duct. A plastic biliary stent was placed. The images then demonstrate cannulation of the common bile duct. Cholangiography demonstrates a diffusely dilated common bile duct. IMPRESSION: ERCP with cholangiography and pancreatic ductography. Placement of a pancreatic duct stent. These images were submitted for radiologic interpretation only. Please see the procedural report for the amount of contrast and the fluoroscopy time utilized. Electronically Signed   By: Jacqulynn Cadet M.D.   On: 08/30/2018 09:32   Dg Abd 2 Views  Result Date: 09/16/2018 CLINICAL DATA:  Migration of biliary stent EXAM: ABDOMEN - 2 VIEW COMPARISON:  09/12/2017 FINDINGS: No visible biliary stent. Formed stool throughout most of the colon. No small bowel dilatation. Advanced spinal degeneration with scoliosis. L3 cement augmentation. Cholecystectomy clips. IMPRESSION: 1. No visible biliary stent. 2. Stool retention. Electronically Signed   By: Monte Fantasia M.D.   On: 09/16/2018 09:58   Mr Abdomen Mrcp Moise Boring Contast  Result Date: 08/27/2018 CLINICAL DATA:  Abnormal LFTs, pancreatitis suspected EXAM: MRI ABDOMEN WITHOUT AND WITH CONTRAST (INCLUDING MRCP) TECHNIQUE: Multiplanar multisequence MR imaging of the abdomen was performed both before and after the administration of intravenous contrast. Heavily T2-weighted images of the biliary and pancreatic ducts were obtained, and three-dimensional MRCP images were rendered by post processing. CONTRAST:  7 mL Gadovist IV COMPARISON:  CT abdomen/pelvis dated 08/23/2018. MRI abdomen dated 07/04/2018. FINDINGS:  Lower chest: Small bilateral pleural effusions. Hepatobiliary: Liver is within normal limits. No suspicious/enhancing hepatic lesions. Status post cholecystectomy. Mild intrahepatic and moderate extrahepatic ductal  dilatation, unchanged. Common duct measures 16 mm, unchanged. No choledocholithiasis is seen. Pancreas: Mild peripancreatic inflammatory changes, compatible with acute pancreatitis. No pancreatic necrosis or ductal dilatation. No peripancreatic fluid collection. Spleen:  Within normal limits. Adrenals/Urinary Tract:  Adrenal glands are within normal limits. 2.4 cm posterior interpolar right renal cyst (series 8/image 38). Left kidney is within normal limits. No hydronephrosis. Stomach/Bowel: Stomach is notable for a large hiatal hernia. Visualized bowel is grossly unremarkable. Vascular/Lymphatic:  No evidence of abdominal aortic aneurysm. No suspicious abdominal lymphadenopathy. Other:  No abdominal ascites Musculoskeletal: Degenerative changes of the lumbar spine. Susceptibility artifact related to prior vertebral augmentation at L3. IMPRESSION: Uncomplicated acute pancreatitis. Status post cholecystectomy. Stable mild intrahepatic and moderate extrahepatic ductal dilatation. No choledocholithiasis is seen. Small bilateral pleural effusions. Electronically Signed   By: Julian Hy M.D.   On: 08/27/2018 19:56   US Abdomen Limited Ruq  Result Date: 08/23/2018 CLINICAL DATA:  Elevated LFTs.  Cholecystectomy. EXAM: ULTRASOUND ABDOMEN LIMITED RIGHT UPPER QUADRANT COMPARISON:  CT 08/23/2018, 09/03/2017.  MRI 07/04/2018. FINDINGS: Gallbladder: Cholecystectomy. Common bile duct: Diameter: 15.5 mm.Similar findings noted on prior CT. Intrahepatic biliary ductal dilatation also again noted. Similar findings noted on prior CT. Liver: No focal hepatic abnormalities identified. Echotexture normal. Portal vein is patent on color Doppler imaging with normal direction of blood flow towards the liver. IMPRESSION: 1. Cholecystectomy. Dilated common bile duct and intrahepatic biliary ductal system again noted. Similar findings noted on prior studies. 2. No focal hepatic abnormality identified. Electronically Signed   By:  Marcello Moores  Register   On: 08/23/2018 13:05       Subjective: No new complaints.   Discharge Exam: Vitals:   09/16/18 0615 09/16/18 0842  BP: (!) 150/58 (!) 141/54  Pulse: 69 86  Resp: 16   Temp: 98.5 F (36.9 C)   SpO2: 95%    Vitals:   09/15/18 1355 09/15/18 2218 09/16/18 0615 09/16/18 0842  BP: (!) 141/61 129/66 (!) 150/58 (!) 141/54  Pulse: 76 77 69 86  Resp: 14 16 16    Temp: 98.1 F (36.7 C) 98.4 F (36.9 C) 98.5 F (36.9 C)   TempSrc: Oral Oral Oral   SpO2: 97% 98% 95%   Weight:      Height:        General: Pt is alert, awake, not in acute distress Cardiovascular: RRR, S1/S2 +, no rubs, no gallops Respiratory: CTA bilaterally, no wheezing, no rhonchi Abdominal: Soft, NT, ND, bowel sounds + Extremities: no edema, no cyanosis    The results of significant diagnostics from this hospitalization (including imaging, microbiology, ancillary and laboratory) are listed below for reference.     Microbiology: Recent Results (from the past 240 hour(s))  Urine culture     Status: Abnormal   Collection Time: 09/12/18  8:44 PM  Result Value Ref Range Status   Specimen Description   Final    URINE, RANDOM Performed at [facility] [address], [place], [place] [address]    Special Requests   Final    NONE Performed at [facility], [place] [address], [place], [place] [address]    Culture 80,000 COLONIES/mL PSEUDOMONAS AERUGINOSA (A)  Final   Report Status 09/15/2018 FINAL  Final   Organism ID, Bacteria PSEUDOMONAS AERUGINOSA (A)  Final      Susceptibility   Pseudomonas  aeruginosa - MIC*    CEFTAZIDIME 8 SENSITIVE Sensitive     CIPROFLOXACIN 0.5 SENSITIVE Sensitive     GENTAMICIN 8 INTERMEDIATE Intermediate     IMIPENEM 2 SENSITIVE Sensitive     PIP/TAZO <=4 SENSITIVE Sensitive     CEFEPIME 4 SENSITIVE Sensitive     * 80,000 COLONIES/mL PSEUDOMONAS AERUGINOSA  MRSA PCR Screening     Status: None   Collection Time: 09/13/18   6:09 AM  Result Value Ref Range Status   MRSA by PCR NEGATIVE NEGATIVE Final    Comment:        The GeneXpert MRSA Assay (FDA approved for NASAL specimens only), is one component of a comprehensive MRSA colonization surveillance program. It is not intended to diagnose MRSA infection nor to guide or monitor treatment for MRSA infections. Performed at [facility], [place] [address], [place], [place] [address]      Labs: BNP (last 3 results) No results for input(s): BNP in the last 8760 hours. Basic Metabolic Panel: Recent Labs  Lab 09/12/18 1907 09/13/18 0326 09/14/18 0503  NA 134* 134* 139  K 4.2 3.4* 4.1  CL 97* 101 102  CO2 28 26 30   GLUCOSE 195* 131* 99  BUN 16 12 8   CREATININE 0.69 0.45 0.59  CALCIUM 9.1 7.8* 9.1   Liver Function Tests: Recent Labs  Lab 09/12/18 1907 09/13/18 0326  AST 20 15  ALT 17 14  ALKPHOS 177* 134*  BILITOT 0.6 0.9  PROT 6.8 5.3*  ALBUMIN 2.8* 2.1*   Recent Labs  Lab 09/12/18 1907  LIPASE 38   No results for input(s): AMMONIA in the last 168 hours. CBC: Recent Labs  Lab 09/12/18 1907 09/13/18 0326  WBC 13.3* 7.8  NEUTROABS 11.4*  --   HGB 13.2 11.7*  HCT 42.9 38.8  MCV 97.7 98.0  PLT 246 199   Cardiac Enzymes: No results for input(s): CKTOTAL, CKMB, CKMBINDEX, TROPONINI in the last 168 hours. BNP: Invalid input(s): POCBNP CBG: Recent Labs  Lab 09/15/18 0732 09/15/18 1205 09/15/18 1702 09/15/18 2215 09/16/18 0726  GLUCAP 113* 200* 217* 119* 104*   D-Dimer No results for input(s): DDIMER in the last 72 hours. Hgb A1c No results for input(s): HGBA1C in the last 72 hours. Lipid Profile No results for input(s): CHOL, HDL, LDLCALC, TRIG, CHOLHDL, LDLDIRECT in the last 72 hours. Thyroid function studies No results for input(s): TSH, T4TOTAL, T3FREE, THYROIDAB in the last 72 hours.  Invalid input(s): FREET3 Anemia work up No results for input(s): VITAMINB12, FOLATE, FERRITIN, TIBC, IRON,  RETICCTPCT in the last 72 hours. Urinalysis    Component Value Date/Time   COLORURINE YELLOW 09/12/2018 2044   APPEARANCEUR CLOUDY (A) 09/12/2018 2044   LABSPEC 1.008 09/12/2018 2044   PHURINE 6.0 09/12/2018 2044   GLUCOSEU NEGATIVE 09/12/2018 2044   HGBUR MODERATE (A) 09/12/2018 2044   BILIRUBINUR NEGATIVE 09/12/2018 2044   KETONESUR NEGATIVE 09/12/2018 2044   PROTEINUR NEGATIVE 09/12/2018 2044   UROBILINOGEN 1.0 04/07/2015 2312   NITRITE NEGATIVE 09/12/2018 2044   LEUKOCYTESUR LARGE (A) 09/12/2018 2044   Sepsis Labs Invalid input(s): PROCALCITONIN,  WBC,  LACTICIDVEN Microbiology Recent Results (from the past 240 hour(s))  Urine culture     Status: Abnormal   Collection Time: 09/12/18  8:44 PM  Result Value Ref Range Status   Specimen Description   Final    URINE, RANDOM Performed at [facility], [place] [address] [place], [place] [address]    Special Requests  Final    NONE Performed at [facility], [place] [address] [place], [place] [address]    Culture 80,000 COLONIES/mL PSEUDOMONAS AERUGINOSA (A)  Final   Report Status 09/15/2018 FINAL  Final   Organism ID, Bacteria PSEUDOMONAS AERUGINOSA (A)  Final      Susceptibility   Pseudomonas aeruginosa - MIC*    CEFTAZIDIME 8 SENSITIVE Sensitive     CIPROFLOXACIN 0.5 SENSITIVE Sensitive     GENTAMICIN 8 INTERMEDIATE Intermediate     IMIPENEM 2 SENSITIVE Sensitive     PIP/TAZO <=4 SENSITIVE Sensitive     CEFEPIME 4 SENSITIVE Sensitive     * 80,000 COLONIES/mL PSEUDOMONAS AERUGINOSA  MRSA PCR Screening     Status: None   Collection Time: 09/13/18  6:09 AM  Result Value Ref Range Status   MRSA by PCR NEGATIVE NEGATIVE Final    Comment:        The GeneXpert MRSA Assay (FDA approved for NASAL specimens only), is one component of a comprehensive MRSA colonization surveillance program. It is not intended to diagnose MRSA infection nor to guide or monitor treatment for MRSA  infections. Performed at [facility], [place] [address], [place], [place] [address]      Time coordinating discharge: 34  minutes  SIGNED:   Hosie Poisson, MD  [facility] 09/16/2018, 11:02 AM Pager   If 7PM-7AM, please contact night-coverage [email or web address] Password [email or web address]

## 2018-09-16 NOTE — Progress Notes (Signed)
Pharmacy Antibiotic Note  Heidi Dominguez is a 85 y.o. female presented to the ED on 09/12/2018 with c/o nausea and decreased urine output.  Patient was started on ceftriaxone on admission for suspected UTI.  UCX on 1/30 is positive for PsA.  Abx changed to cefepime on 2/2 for PsA UTI.  Today, 09/16/18  Day 2 Cefepime  Afebrile  Plan: Change cefepime from 2g IV q12 to 1g IV q24 due to current renal function and advanced age  __________________________________________  Height: 5\' 4"  (162.6 cm) Weight: 168 lb 10.4 oz (76.5 kg) IBW/kg (Calculated) : 54.7  Temp (24hrs), Avg:98.3 F (36.8 C), Min:98.1 F (36.7 C), Max:98.5 F (36.9 C)  Recent Labs  Lab 09/12/18 1907 09/13/18 0326 09/14/18 0503  WBC 13.3* 7.8  --   CREATININE 0.69 0.45 0.59    Estimated Creatinine Clearance: 42.1 mL/min (by C-G formula based on SCr of 0.59 mg/dL).    Allergies  Allergen Reactions  . Catapres [Clonidine Hcl] Other (See Comments)    Unknown. Unknown.  . Codeine Other (See Comments)    "Makes me out of my head, loopy"  . Covera-Hs [Verapamil Hcl] Other (See Comments)    Unknown.  Marland Kitchen Hydrocodone Other (See Comments)    "makes me go out of my head, loopy"  . Other Other (See Comments)    Any type of narcotics Feels "loopy" Any type of narcotics Feels "loopy"  . Sulfa Antibiotics Itching  . Sulfamethoxazole Diarrhea  . Verapamil Diarrhea and Other (See Comments)  . Sulfasalazine Itching    Antimicrobials this admission:  1/31 CTX>> 2/1 2/2 cefepime >>  Microbiology results:  1/30 UCx: 80K PsA (I= gent; S= cefep, ceftaz, cipro, imi, zosyn) 1/31 MRSA PCR: neg  Thank you for allowing pharmacy to be a part of this patient's care.  Kara Mead 09/16/2018 10:44 AM

## 2018-09-16 NOTE — Care Management Important Message (Signed)
Important Message  Patient Details  Name: Heidi Dominguez MRN: [id number] Date of Birth: 02/20/1921   Medicare Important Message Given:  Yes    Kerin Salen 09/16/2018, 12:30 [place] Message  Patient Details  Name: Heidi Dominguez MRN: [id number] Date of Birth: Jun 22, 1921   Medicare Important Message Given:  Yes    Kerin Salen 09/16/2018, 12:30 PM

## 2018-09-16 NOTE — Progress Notes (Signed)
At 1522, report was provided to Doris Miller [facility], a nurse at [facility]. After report was provided, Timmothy Sours reported no further questions or concerns. [facility] is setup to pick the pt up at 1700 per Elmyra Ricks, the Education officer, museum.

## 2018-09-18 DIAGNOSIS — N390 Urinary tract infection, site not specified: Secondary | ICD-10-CM | POA: Diagnosis not present

## 2018-09-18 DIAGNOSIS — S4381XD Sprain of other specified parts of right shoulder girdle, subsequent encounter: Secondary | ICD-10-CM | POA: Diagnosis not present

## 2018-09-18 DIAGNOSIS — K851 Biliary acute pancreatitis without necrosis or infection: Secondary | ICD-10-CM | POA: Diagnosis not present

## 2018-09-18 DIAGNOSIS — R945 Abnormal results of liver function studies: Secondary | ICD-10-CM | POA: Diagnosis not present

## 2018-09-25 DIAGNOSIS — R600 Localized edema: Secondary | ICD-10-CM | POA: Diagnosis not present

## 2018-09-25 DIAGNOSIS — S4381XD Sprain of other specified parts of right shoulder girdle, subsequent encounter: Secondary | ICD-10-CM | POA: Diagnosis not present

## 2018-09-25 DIAGNOSIS — H04123 Dry eye syndrome of bilateral lacrimal glands: Secondary | ICD-10-CM | POA: Diagnosis not present

## 2018-09-25 DIAGNOSIS — Z96611 Presence of right artificial shoulder joint: Secondary | ICD-10-CM | POA: Insufficient documentation

## 2018-09-26 DIAGNOSIS — R1902 Left upper quadrant abdominal swelling, mass and lump: Secondary | ICD-10-CM | POA: Diagnosis not present

## 2018-09-26 DIAGNOSIS — S4381XD Sprain of other specified parts of right shoulder girdle, subsequent encounter: Secondary | ICD-10-CM | POA: Diagnosis not present

## 2018-09-26 DIAGNOSIS — R600 Localized edema: Secondary | ICD-10-CM | POA: Diagnosis not present

## 2018-09-26 DIAGNOSIS — H04123 Dry eye syndrome of bilateral lacrimal glands: Secondary | ICD-10-CM | POA: Diagnosis not present

## 2018-09-27 ENCOUNTER — Ambulatory Visit: Payer: Medicare Other | Admitting: Podiatry

## 2018-10-02 ENCOUNTER — Other Ambulatory Visit: Payer: Self-pay | Admitting: Orthopedic Surgery

## 2018-10-02 ENCOUNTER — Ambulatory Visit
Admission: RE | Admit: 2018-10-02 | Discharge: 2018-10-02 | Disposition: A | Discharge status: Home / Self Care | Payer: Medicare Other | Source: Ambulatory Visit | Attending: Orthopedic Surgery | Admitting: Orthopedic Surgery

## 2018-10-02 DIAGNOSIS — Z96611 Presence of right artificial shoulder joint: Secondary | ICD-10-CM | POA: Diagnosis not present

## 2018-10-02 DIAGNOSIS — M25511 Pain in right shoulder: Secondary | ICD-10-CM | POA: Diagnosis not present

## 2018-10-02 DIAGNOSIS — Z471 Aftercare following joint replacement surgery: Secondary | ICD-10-CM | POA: Diagnosis not present

## 2018-10-07 ENCOUNTER — Other Ambulatory Visit: Payer: Self-pay | Admitting: *Deleted

## 2018-10-07 NOTE — Patient Outreach (Signed)
[facility]) Care Management  10/07/2018  Heidi Dominguez 04-Jul-1921 [id number]   Facility site visit to Heidi Dominguez and rehab.  Attempted to speak with discharge planner but she was unavailable to discuss discharge.  Went to patient's bedside.  Patient was sitting up in wheelchair. Patient stated she was unsure of her discharge date. Patient stated she has someone who stays with her at her home. Patient stated she has a friend who provides her transportation for her. Patient states her daughter Heidi Dominguez manages her medications and is managing her discharge. Patient gave permission to talk with her daughter Heidi Dominguez. Gave best number to reach her at [phone] or [phone].   Rutherford Limerick RN, BSN [place] Acute Care Coordinator [phone]) Business Mobile [phone]) Toll free office

## 2018-10-08 DIAGNOSIS — Z96611 Presence of right artificial shoulder joint: Secondary | ICD-10-CM | POA: Diagnosis not present

## 2018-10-08 DIAGNOSIS — M19011 Primary osteoarthritis, right shoulder: Secondary | ICD-10-CM | POA: Diagnosis not present

## 2018-10-08 DIAGNOSIS — M25511 Pain in right shoulder: Secondary | ICD-10-CM | POA: Diagnosis not present

## 2018-10-17 ENCOUNTER — Ambulatory Visit (INDEPENDENT_AMBULATORY_CARE_PROVIDER_SITE_OTHER): Payer: Medicare Other | Admitting: Podiatry

## 2018-10-17 DIAGNOSIS — L84 Corns and callosities: Secondary | ICD-10-CM | POA: Diagnosis not present

## 2018-10-17 DIAGNOSIS — M79675 Pain in left toe(s): Secondary | ICD-10-CM

## 2018-10-17 DIAGNOSIS — E1142 Type 2 diabetes mellitus with diabetic polyneuropathy: Secondary | ICD-10-CM | POA: Diagnosis not present

## 2018-10-17 DIAGNOSIS — B351 Tinea unguium: Secondary | ICD-10-CM

## 2018-10-17 DIAGNOSIS — M79674 Pain in right toe(s): Secondary | ICD-10-CM

## 2018-10-17 NOTE — Patient Instructions (Signed)
Diabetes Mellitus and Foot Care Foot care is an important part of your health, especially when you have diabetes. Diabetes may cause you to have problems because of poor blood flow (circulation) to your feet and legs, which can cause your skin to:  Become thinner and drier.  Break more easily.  Heal more slowly.  Peel and crack. You may also have nerve damage (neuropathy) in your legs and feet, causing decreased feeling in them. This means that you may not notice minor injuries to your feet that could lead to more serious problems. Noticing and addressing any potential problems early is the best way to prevent future foot problems. How to care for your feet Foot hygiene  Wash your feet daily with warm water and mild soap. Do not use hot water. Then, pat your feet and the areas between your toes until they are completely dry. Do not soak your feet as this can dry your skin.  Trim your toenails straight across. Do not dig under them or around the cuticle. File the edges of your nails with an emery board or nail file.  Apply a moisturizing lotion or petroleum jelly to the skin on your feet and to dry, brittle toenails. Use lotion that does not contain alcohol and is unscented. Do not apply lotion between your toes. Shoes and socks  Wear clean socks or stockings every day. Make sure they are not too tight. Do not wear knee-high stockings since they may decrease blood flow to your legs.  Wear shoes that fit properly and have enough cushioning. Always look in your shoes before you put them on to be sure there are no objects inside.  To break in new shoes, wear them for just a few hours a day. This prevents injuries on your feet. Wounds, scrapes, corns, and calluses  Check your feet daily for blisters, cuts, bruises, sores, and redness. If you cannot see the bottom of your feet, use a mirror or ask someone for help.  Do not cut corns or calluses or try to remove them with medicine.  If you  find a minor scrape, cut, or break in the skin on your feet, keep it and the skin around it clean and dry. You may clean these areas with mild soap and water. Do not clean the area with peroxide, alcohol, or iodine.  If you have a wound, scrape, corn, or callus on your foot, look at it several times a day to make sure it is healing and not infected. Check for: ? Redness, swelling, or pain. ? Fluid or blood. ? Warmth. ? Pus or a bad smell. General instructions  Do not cross your legs. This may decrease blood flow to your feet.  Do not use heating pads or hot water bottles on your feet. They may burn your skin. If you have lost feeling in your feet or legs, you may not know this is happening until it is too late.  Protect your feet from hot and cold by wearing shoes, such as at the beach or on hot pavement.  Schedule a complete foot exam at least once a year (annually) or more often if you have foot problems. If you have foot problems, report any cuts, sores, or bruises to your health care provider immediately. Contact a health care provider if:  You have a medical condition that increases your risk of infection and you have any cuts, sores, or bruises on your feet.  You have an injury that is not   healing.  You have redness on your legs or feet.  You feel burning or tingling in your legs or feet.  You have pain or cramps in your legs and feet.  Your legs or feet are numb.  Your feet always feel cold.  You have pain around a toenail. Get help right away if:  You have a wound, scrape, corn, or callus on your foot and: ? You have pain, swelling, or redness that gets worse. ? You have fluid or blood coming from the wound, scrape, corn, or callus. ? Your wound, scrape, corn, or callus feels warm to the touch. ? You have pus or a bad smell coming from the wound, scrape, corn, or callus. ? You have a fever. ? You have a red line going up your leg. Summary  Check your feet every day  for cuts, sores, red spots, swelling, and blisters.  Moisturize feet and legs daily.  Wear shoes that fit properly and have enough cushioning.  If you have foot problems, report any cuts, sores, or bruises to your health care provider immediately.  Schedule a complete foot exam at least once a year (annually) or more often if you have foot problems. This information is not intended to replace advice given to you by your health care provider. Make sure you discuss any questions you have with your health care provider. Document Released: 07/28/2000 Document Revised: 09/12/2017 Document Reviewed: 09/01/2016 Elsevier Interactive Patient Education  2019 Elsevier Inc.  Corns and Calluses Corns are small areas of thickened skin that occur on the top, sides, or tip of a toe. They contain a cone-shaped core with a point that can press on a nerve below. This causes pain.  Calluses are areas of thickened skin that can occur anywhere on the body, including the hands, fingers, palms, soles of the feet, and heels. Calluses are usually larger than corns. What are the causes? Corns and calluses are caused by rubbing (friction) or pressure, such as from shoes that are too tight or do not fit properly. What increases the risk? Corns are more likely to develop in people who have misshapen toes (toe deformities), such as hammer toes. Calluses can occur with friction to any area of the skin. They are more likely to develop in people who:  Work with their hands.  Wear shoes that fit poorly, are too tight, or are high-heeled.  Have toe deformities. What are the signs or symptoms? Symptoms of a corn or callus include:  A hard growth on the skin.  Pain or tenderness under the skin.  Redness and swelling.  Increased discomfort while wearing tight-fitting shoes, if your feet are affected. If a corn or callus becomes infected, symptoms may include:  Redness and swelling that gets worse.  Pain.   Fluid, blood, or pus draining from the corn or callus. How is this diagnosed? Corns and calluses may be diagnosed based on your symptoms, your medical history, and a physical exam. How is this treated? Treatment for corns and calluses may include:  Removing the cause of the friction or pressure. This may involve: ? Changing your shoes. ? Wearing shoe inserts (orthotics) or other protective layers in your shoes, such as a corn pad. ? Wearing gloves.  Applying medicine to the skin (topical medicine) to help soften skin in the hardened, thickened areas.  Removing layers of dead skin with a file to reduce the size of the corn or callus.  Removing the corn or callus with a scalpel   or laser.  Taking antibiotic medicines, if your corn or callus is infected.  Having surgery, if a toe deformity is the cause. Follow these instructions at home:   Take over-the-counter and prescription medicines only as told by your health care provider.  If you were prescribed an antibiotic, take it as told by your health care provider. Do not stop taking it even if your condition starts to improve.  Wear shoes that fit well. Avoid wearing high-heeled shoes and shoes that are too tight or too loose.  Wear any padding, protective layers, gloves, or orthotics as told by your health care provider.  Soak your hands or feet and then use a file or pumice stone to soften your corn or callus. Do this as told by your health care provider.  Check your corn or callus every day for symptoms of infection. Contact a health care provider if you:  Notice that your symptoms do not improve with treatment.  Have redness or swelling that gets worse.  Notice that your corn or callus becomes painful.  Have fluid, blood, or pus coming from your corn or callus.  Have new symptoms. Summary  Corns are small areas of thickened skin that occur on the top, sides, or tip of a toe.  Calluses are areas of thickened skin that  can occur anywhere on the body, including the hands, fingers, palms, and soles of the feet. Calluses are usually larger than corns.  Corns and calluses are caused by rubbing (friction) or pressure, such as from shoes that are too tight or do not fit properly.  Treatment may include wearing any padding, protective layers, gloves, or orthotics as told by your health care provider. This information is not intended to replace advice given to you by your health care provider. Make sure you discuss any questions you have with your health care provider. Document Released: 05/06/2004 Document Revised: 06/13/2017 Document Reviewed: 06/13/2017 Elsevier Interactive Patient Education  2019 Elsevier Inc.  Onychomycosis/Fungal Toenails  WHAT IS IT? An infection that lies within the keratin of your nail plate that is caused by a fungus.  WHY ME? Fungal infections affect all ages, sexes, races, and creeds.  There may be many factors that predispose you to a fungal infection such as age, coexisting medical conditions such as diabetes, or an autoimmune disease; stress, medications, fatigue, genetics, etc.  Bottom line: fungus thrives in a warm, moist environment and your shoes offer such a location.  IS IT CONTAGIOUS? Theoretically, yes.  You do not want to share shoes, nail clippers or files with someone who has fungal toenails.  Walking around barefoot in the same room or sleeping in the same bed is unlikely to transfer the organism.  It is important to realize, however, that fungus can spread easily from one nail to the next on the same foot.  HOW DO WE TREAT THIS?  There are several ways to treat this condition.  Treatment may depend on many factors such as age, medications, pregnancy, liver and kidney conditions, etc.  It is best to ask your doctor which options are available to you.  1. No treatment.   Unlike many other medical concerns, you can live with this condition.  However for many people this can be  a painful condition and may lead to ingrown toenails or a bacterial infection.  It is recommended that you keep the nails cut short to help reduce the amount of fungal nail. 2. Topical treatment.  These range from herbal remedies   to prescription strength nail lacquers.  About 40-50% effective, topicals require twice daily application for approximately 9 to 12 months or until an entirely new nail has grown out.  The most effective topicals are medical grade medications available through physicians offices. 3. Oral antifungal medications.  With an 80-90% cure rate, the most common oral medication requires 3 to 4 months of therapy and stays in your system for a year as the new nail grows out.  Oral antifungal medications do require blood work to make sure it is a safe drug for you.  A liver function panel will be performed prior to starting the medication and after the first month of treatment.  It is important to have the blood work performed to avoid any harmful side effects.  In general, this medication safe but blood work is required. 4. Laser Therapy.  This treatment is performed by applying a specialized laser to the affected nail plate.  This therapy is noninvasive, fast, and non-painful.  It is not covered by insurance and is therefore, out of pocket.  The results have been very good with a 80-95% cure rate.  [facility] is the only practice in the area to offer this therapy. 5. Permanent Nail Avulsion.  Removing the entire nail so that a new nail will not grow back. 

## 2018-10-22 DIAGNOSIS — I5032 Chronic diastolic (congestive) heart failure: Secondary | ICD-10-CM | POA: Diagnosis not present

## 2018-10-22 DIAGNOSIS — Z471 Aftercare following joint replacement surgery: Secondary | ICD-10-CM | POA: Diagnosis not present

## 2018-10-22 DIAGNOSIS — I130 Hypertensive heart and chronic kidney disease with heart failure and stage 1 through stage 4 chronic kidney disease, or unspecified chronic kidney disease: Secondary | ICD-10-CM | POA: Diagnosis not present

## 2018-10-22 DIAGNOSIS — H6123 Impacted cerumen, bilateral: Secondary | ICD-10-CM | POA: Diagnosis not present

## 2018-10-22 DIAGNOSIS — M19011 Primary osteoarthritis, right shoulder: Secondary | ICD-10-CM | POA: Diagnosis not present

## 2018-10-22 DIAGNOSIS — Z96611 Presence of right artificial shoulder joint: Secondary | ICD-10-CM | POA: Diagnosis not present

## 2018-10-22 DIAGNOSIS — K219 Gastro-esophageal reflux disease without esophagitis: Secondary | ICD-10-CM | POA: Diagnosis not present

## 2018-10-25 DIAGNOSIS — N182 Chronic kidney disease, stage 2 (mild): Secondary | ICD-10-CM | POA: Diagnosis not present

## 2018-10-25 DIAGNOSIS — H6123 Impacted cerumen, bilateral: Secondary | ICD-10-CM | POA: Diagnosis not present

## 2018-10-25 DIAGNOSIS — E1122 Type 2 diabetes mellitus with diabetic chronic kidney disease: Secondary | ICD-10-CM | POA: Diagnosis not present

## 2018-10-27 ENCOUNTER — Encounter: Payer: Self-pay | Admitting: Podiatry

## 2018-10-27 NOTE — Progress Notes (Signed)
Subjective: Heidi Dominguez presents to clinic today for preventative diabetic foot care.  She is seen for  painful, discolored, thick toenails and painful callus. Both interfere with activities of daily living. Pain is aggravated when wearing enclosed shoe gear and is relieved with periodic professional debridement.  She is now at [facility] and [place] and is unsure of her discharge date.  Heidi Manes, MD is her PCP.    Current Outpatient Medications:  .  acetaminophen (TYLENOL) 500 MG tablet, Take 1 tablet (500 mg total) by mouth every 6 (six) hours as needed for mild pain or moderate pain., Disp: 30 tablet, Rfl: 0 .  albuterol (PROVENTIL HFA;VENTOLIN HFA) 108 (90 Base) MCG/ACT inhaler, Inhale into the lungs every 6 (six) hours as needed for wheezing or shortness of breath., Disp: , Rfl:  .  amLODipine (NORVASC) 5 MG tablet, Take 1 tablet (5 mg total) by mouth daily., Disp: , Rfl:  .  aspirin 81 MG tablet, Take 81 mg by mouth daily., Disp: , Rfl:  .  famotidine (PEPCID) 20 MG tablet, Take 20 mg by mouth at bedtime., Disp: , Rfl:  .  Ferrous Gluconate (IRON) 240 (27 FE) MG TABS, Take 1 tablet by mouth daily., Disp: , Rfl:  .  folic acid (FOLVITE) 242 MCG tablet, Take 400 mcg by mouth daily., Disp: , Rfl:  .  hydrochlorothiazide 10 mg/mL SUSP, Take 0.63 mLs (6.25 mg total) by mouth daily., Disp: , Rfl:  .  JANUVIA 50 MG tablet, Take 50 mg by mouth daily. , Disp: , Rfl: 5 .  loratadine (CLARITIN) 10 MG tablet, Take 10 mg by mouth daily., Disp: , Rfl:  .  magnesium oxide (MAG-OX) 400 (241.3 Mg) MG tablet, Take 1 tablet (400 mg total) by mouth daily., Disp: , Rfl:  .  pantoprazole (PROTONIX) 40 MG tablet, Take 40 mg by mouth daily., Disp: , Rfl:  .  Polyethyl Glycol-Propyl Glycol (SYSTANE) 0.4-0.3 % SOLN, Place 2 drops into both eyes every 4 (four) hours as needed (dry eyes)., Disp: , Rfl:  .  polyethylene glycol (MIRALAX / GLYCOLAX) packet, Take 17 g by mouth daily as needed for mild  constipation., Disp: , Rfl:  .  senna-docusate (SENOKOT-S) 8.6-50 MG tablet, Take 2 tablets by mouth 2 (two) times daily., Disp: 30 tablet, Rfl: 0 .  Sennosides (SENNA-TIME PO), senna, Disp: , Rfl:  .  vitamin B-12 (CYANOCOBALAMIN) 500 MCG tablet, Take 1,000 mcg by mouth daily. , Disp: , Rfl:  .  VITAMIN D, CHOLECALCIFEROL, PO, Take 5,000 Units by mouth daily., Disp: , Rfl:   Allergies  Allergen Reactions  . Catapres [Clonidine Hcl] Other (See Comments)    Unknown. Unknown.  . Codeine Other (See Comments)    "Makes me out of my head, loopy"  . Covera-Hs [Verapamil Hcl] Other (See Comments)    Unknown.  Marland Kitchen Hydrocodone Other (See Comments)    "makes me go out of my head, loopy"  . Other Other (See Comments)    Any type of narcotics Feels "loopy" Any type of narcotics Feels "loopy"  . Sulfa Antibiotics Itching  . Sulfamethoxazole Diarrhea  . Verapamil Diarrhea and Other (See Comments)  . Sulfasalazine Itching    Vascular Examination: Capillary refill time <3 seconds x 10 digits.  Dorsalis pedis 1/4 b/l  Posterior tibial pulses absent b/l.  No digital hair x 10 digits.  Skin temperature warm to cool b/l  +Trace edema BLE  +Varicosities b/l LE  Dermatological Examination: Skin is moderately  dry and flaky b/l.  No open wounds b/l.  No pressure sores noted b/l.  Toenails 1-5 b/l discolored, thick, dystrophic with subungual debris and pain with palpation to nailbeds due to thickness of nails.  Hyperkeratotic lesions dorsal 5th digit PIPJ and submetatarsal head 1 b/l. No erythema, no edema, no drainage, no flocculence noted b/l.   Musculoskeletal: Muscle strength 5/5 to all LE muscle groups.  HAV with bunion b/l.  Neurological: Sensation diminished with 10 gram monofilament.  Vibratory sensation diminished b/l.  Assessment: 1. Painful onychomycosis toenails 1-5 b/l 2. Calluses submet head 1 b/l 3. Corns b/l 5th digits 4. NIDDM with Diabetic  neuropathy  Plan: 1. Continue diabetic foot care principles. Literature dispensed. 2. Toenails 1-5 b/l were debrided in length and girth without iatrogenic bleeding. 3. Calluses and corns pared with sterile scalpel blade dorsal 5th digit PIPJ and submetatarsal head 1 b/l without incident.  4. For dry skin, orders written for [facility] staff to apply Aquaphor Ointment to both feet once daily. 5. Patient to continue diabetic shoes daily. 6. Patient to report any pedal injuries to medical professional  7. Follow up 9 weeks.  8. Patient/POA to call should there be a concern in the interim.

## 2018-10-31 NOTE — H&P (Signed)
Patient's anticipated LOS is less than 2 midnights, meeting these requirements: - Younger than 33 - Lives within 1 hour of care - Has a competent adult at home to recover with post-op recover - NO history of  - Chronic pain requiring opiods  - Diabetes  - Coronary Artery Disease  - Heart failure  - Heart attack  - Stroke  - DVT/VTE  - Cardiac arrhythmia  - Respiratory Failure/COPD  - Renal failure  - Anemia  - Advanced Liver disease       Heidi Dominguez is an 85 y.o. female.    Chief Complaint: right shoulder pain  HPI: Pt is a 85 y.o. female complaining of right shoulder pain for multiple years. Pain had continually increased since the beginning. X-rays and labs show E Coli infection to right shoulder s/p hemiarthroplasty. Pt has tried various conservative treatments which have failed to alleviate their symptoms. Various options are discussed with the patient. Risks, benefits and expectations were discussed with the patient. Patient understand the risks, benefits and expectations and wishes to proceed with surgery.   PCP:  Lajean Manes, MD  D/C Plans: Home  PMH: Past Medical History:  Diagnosis Date  . Arthritis    "qwhere" (07/04/2018)  . Chronic back pain    "all over" (07/04/2018)  . Chronic diastolic CHF (congestive heart failure) ([place])   . Complication of anesthesia    "I have a hard time waking up"  . Degenerative joint disease of shoulder region   . Family history of adverse reaction to anesthesia    "daughter has the shakes when she wakes up"  . GERD (gastroesophageal reflux disease)   . Heart murmur   . High cholesterol   . History of hiatal hernia   . HOH (hard of hearing)   . Hypertension   . Osteoarthritis   . Phlebitis    "BLE"  . Type II diabetes mellitus (HCC)     PSH: Past Surgical History:  Procedure Laterality Date  . ABDOMINAL HYSTERECTOMY    . BACK SURGERY    . CATARACT EXTRACTION W/ INTRAOCULAR LENS  IMPLANT, BILATERAL Bilateral    . ERCP N/A 08/30/2018   Procedure: ENDOSCOPIC RETROGRADE CHOLANGIOPANCREATOGRAPHY (ERCP);  Surgeon: Clarene Essex, MD;  Location: [facility];  Service: Endoscopy;  Laterality: N/A;  . FIXATION KYPHOPLASTY    . JOINT REPLACEMENT    . LAPAROSCOPIC CHOLECYSTECTOMY SINGLE SITE WITH INTRAOPERATIVE CHOLANGIOGRAM N/A 04/11/2015   Procedure: LAPAROSCOPIC LYSIS OF ADHESIONS, LAPAROSCOPIC CHOLECYSTECTOMY WITH INTRAOPERATIVE CHOLANGIOGRAM;  Surgeon: Michael Boston, MD;  Location: [facility];  Service: General;  Laterality: N/A;  . PANCREATIC STENT PLACEMENT  08/30/2018   Procedure: PANCREATIC STENT PLACEMENT;  Surgeon: Clarene Essex, MD;  [facility];  Service: Endoscopy;;  . REMOVAL OF STONES  08/30/2018   Procedure: REMOVAL OF STONES;  Surgeon: Clarene Essex, MD;  [facility];  Service: Endoscopy;;  . SHOULDER OPEN ROTATOR CUFF REPAIR Bilateral   . SPHINCTEROTOMY  08/30/2018   Procedure: SPHINCTEROTOMY;  Surgeon: Clarene Essex, MD;  [facility];  Service: Endoscopy;;  . TOTAL KNEE ARTHROPLASTY Bilateral     Social History:  reports that she has never smoked. She has never used smokeless tobacco. She reports that she does not drink alcohol or use drugs.  Allergies:  Allergies  Allergen Reactions  . Catapres [Clonidine Hcl] Other (See Comments)    Unknown. Unknown.  . Codeine Other (See Comments)    "Makes me out of my head, loopy"  . Covera-Hs [employer]  Hcl] Other (See Comments)    Unknown.  Marland Kitchen Hydrocodone Other (See Comments)    "makes me go out of my head, loopy"  . Other Other (See Comments)    Any type of narcotics Feels "loopy" Any type of narcotics Feels "loopy"  . Sulfa Antibiotics Itching  . Sulfamethoxazole Diarrhea  . Verapamil Diarrhea and Other (See Comments)  . Sulfasalazine Itching    Medications: No current facility-administered medications for this encounter.    Current Outpatient Medications  Medication Sig Dispense Refill  . acetaminophen  (TYLENOL) 500 MG tablet Take 1 tablet (500 mg total) by mouth every 6 (six) hours as needed for mild pain or moderate pain. 30 tablet 0  . albuterol (PROVENTIL HFA;VENTOLIN HFA) 108 (90 Base) MCG/ACT inhaler Inhale into the lungs every 6 (six) hours as needed for wheezing or shortness of breath.    Marland Kitchen amLODipine (NORVASC) 5 MG tablet Take 1 tablet (5 mg total) by mouth daily.    Marland Kitchen aspirin 81 MG tablet Take 81 mg by mouth daily.    . famotidine (PEPCID) 20 MG tablet Take 20 mg by mouth at bedtime.    . Ferrous Gluconate (IRON) 240 (27 FE) MG TABS Take 1 tablet by mouth daily.    . folic acid (FOLVITE) 397 MCG tablet Take 400 mcg by mouth daily.    . hydrochlorothiazide 10 mg/mL SUSP Take 0.63 mLs (6.25 mg total) by mouth daily.    Marland Kitchen JANUVIA 50 MG tablet Take 50 mg by mouth daily.   5  . loratadine (CLARITIN) 10 MG tablet Take 10 mg by mouth daily.    . magnesium oxide (MAG-OX) 400 (241.3 Mg) MG tablet Take 1 tablet (400 mg total) by mouth daily.    . pantoprazole (PROTONIX) 40 MG tablet Take 40 mg by mouth daily.    Vladimir Faster Glycol-Propyl Glycol (SYSTANE) 0.4-0.3 % SOLN Place 2 drops into both eyes every 4 (four) hours as needed (dry eyes).    . polyethylene glycol (MIRALAX / GLYCOLAX) packet Take 17 g by mouth daily as needed for mild constipation.    . senna-docusate (SENOKOT-S) 8.6-50 MG tablet Take 2 tablets by mouth 2 (two) times daily. 30 tablet 0  . Sennosides (SENNA-TIME PO) senna    . vitamin B-12 (CYANOCOBALAMIN) 500 MCG tablet Take 1,000 mcg by mouth daily.     Marland Kitchen VITAMIN D, CHOLECALCIFEROL, PO Take 5,000 Units by mouth daily.      No results found for this or any previous visit (from the past 48 hour(s)). No results found.  ROS: Pain with rom of the right upper extremity  Physical Exam: Alert and oriented 85 y.o. female in no acute distress Cranial nerves 2-12 intact Cervical spine: full rom with no tenderness, nv intact distally Chest: active breath sounds bilaterally, no  wheeze rhonchi or rales Heart: regular rate and rhythm, no murmur Abd: non tender non distended with active bowel sounds Hip is stable with rom  Right shoulder with mild to moderate effusion nv intact distally Pain with rom No erythema or drainage  Assessment/Plan Assessment: right shoulder infection s/p hemi arthroplasty  Plan:  Patient will undergo a right shoulder I&D by Dr. Veverly Fells at [facility]. Risks benefits and expectations were discussed with the patient. Patient understand risks, benefits and expectations and wishes to proceed. Preoperative templating of the joint replacement has been completed, documented, and submitted to the Operating Room personnel in order to optimize intra-operative equipment management.   [employer], [place] is now [facility]  [place] [address], [place], [place], [place] [address] Phone: [phone] [email or web address] Facebook  [employer]

## 2018-11-01 NOTE — Progress Notes (Signed)
[place] and spoke to [place] and requested her to Fax me patient's recent progress note, most recent MAR,  and any labs within the last 14 days . Transportation provided by [employer]. Patient's emergency contact is her daughter-[name] Rothman at [phone].

## 2018-11-04 ENCOUNTER — Encounter (HOSPITAL_COMMUNITY): Payer: Self-pay | Admitting: *Deleted

## 2018-11-04 NOTE — Anesthesia Preprocedure Evaluation (Addendum)
Anesthesia Evaluation  Patient identified by MRN, date of birth, ID band Patient awake    Reviewed: Allergy & Precautions, NPO status , Patient's Chart, lab work & pertinent test results  Airway Mallampati: II  TM Distance: >3 FB     Dental   Pulmonary shortness of breath,    breath sounds clear to auscultation       Cardiovascular hypertension, +CHF  + Valvular Problems/Murmurs  Rhythm:Regular Rate:Normal     Neuro/Psych    GI/Hepatic hiatal hernia, GERD  ,  Endo/Other  diabetes  Renal/GU Renal disease     Musculoskeletal   Abdominal   Peds  Hematology   Anesthesia Other Findings   Reproductive/Obstetrics                           Anesthesia Physical Anesthesia Plan  ASA: III  Anesthesia Plan: General   Post-op Pain Management:    Induction: Intravenous  PONV Risk Score and Plan: Ondansetron  Airway Management Planned: Oral ETT  Additional Equipment:   Intra-op Plan:   Post-operative Plan: Possible Post-op intubation/ventilation  Informed Consent:     Dental advisory given  Plan Discussed with: CRNA and Anesthesiologist  Anesthesia Plan Comments: (See PAT note 11/04/18, Konrad Felix, PA-C)       Anesthesia Quick Evaluation

## 2018-11-04 NOTE — Progress Notes (Signed)
Preop instructions for:  Heidi Dominguez                        Date of Birth : [birth year]                            Date of Procedure: 11/08/2018        Doctor: Dr Esmond Plants  Time to arrive at [facility]: 0530 am  Report to: Admitting  Procedure:right shoulder irrigation and debridement  Any procedure time changes, MD office will notify you!   Do not eat or drink past midnight the night before your procedure.(To include any tube feedings-must be discontinued)    Take these morning medications only with sips of water.(or give through gastrostomy or feeding tube). Albuterol Inhaler if needed and bring, Amlodipine, Pantoprazole, eye drops as usual  Note: No Insulin or Diabetic meds should be given or taken the morning of the procedure!   Facility contact: [name]                   Phone: [place]:  Transportation contact phone#: [name] [phone]  Please send day of procedure:current med list and meds last taken that day, confirm nothing by mouth status from what time, Patient Demographic info( to include DNR status, problem list, allergies)   RN contact name/phone#: [name]                               and Fax #: [phone]  Bring Insurance card and picture ID Leave all jewelry and other valuables at place where living( no metal or rings to be worn) No contact lens Women-no make-up, no lotions,perfumes,powders   Any questions day of procedure,call [place]- [phone]    Sent from :[facility] Pres[facility]                   [place]                   Fax:[phone]  Sent by : Gillian Shields RN

## 2018-11-04 NOTE — Progress Notes (Addendum)
Anesthesia Chart Review   Case:  [id number] Date/Time:  11/08/18 0715   Procedure:  INCISION AND DRAINAGE right shoulder (Right )   Anesthesia type:  General   Pre-op diagnosis:  Right shoulder infection   [facility]:  [facility] / [facility]   Surgeon:  [name], MD      DISCUSSION:85 yo never smoker with h/o HTN, recent biliary colic with stent placement (08/2017), GERD, CKD Stage III, CHF, DM II, right shoulder infection scheduled for above procedure 11/08/18 with Dr. [name].   Pt admitted 1/20-09/16/18 due to UTI, weakness.  She was stable at discharge, discharged on antibiotics, creatinine stable, discharged to SNF.  History of right shoulder hemiarthroplasty 04/14/09.  Has complained of persistent right shoulder pain during recent admissions for other health reasons.   Pt can proceed with planned procedure after DOS evaluation.  VS: There were no vitals taken for this visit.  PROVIDERS: Lajean Manes, MD is PCP    LABS: Labs DOS, same day workup (all labs ordered are listed, but only abnormal results are displayed)  Labs Reviewed - No data to display   IMAGES: CT Shoulder 10/02/18 IMPRESSION: 1. Suspected chronic rotator cuff tears with prominent atrophy of the supraspinatus, infraspinatus, and subscapularis muscles as well as superior subluxation of the prosthetic humeral head against the acromion and clavicle. There is some flattening and remodeling of the glenoid to tilt upwards and articulate with the humeral head. 2. Suspected fluid distention of the joint capsule although this is not totally certain given the degree of streak artifact. 3. A do not observe an acute fracture. 4. Pre acromial os acromiale. 5. Moderate-sized hiatal hernia. 6. Atherosclerosis. 7. Right thyroid goiter versus hypodense thyroid nodule.  EKG: 09/12/18 Rate 83 bpm Sinus rhythm  Consider left atrial enlargement Borderline T wave abnormalities   CV: Echo 04/09/2015 Study  Conclusions  - Left ventricle: The cavity size was normal. There was mild   concentric hypertrophy. Systolic function was normal. The   estimated ejection fraction was in the range of 60% to 65%. Wall   motion was normal; there were no regional wall motion   abnormalities. There was an increased relative contribution of   atrial contraction to ventricular filling. Doppler parameters are   consistent with abnormal left ventricular relaxation (grade 1   diastolic dysfunction). Doppler parameters are consistent with   high ventricular filling pressure. - Aortic valve: Moderately calcified annulus. Trileaflet.   Thickening and calcification. There was mild stenosis. Valve area   (VTI): 1.33 cm^2. Valve area (Vmax): 1.45 cm^2. Valve area   (Vmean): 1.25 cm^2.  Mean gradient (S): 19 mm Hg. - Mitral valve: Calcified annulus. There was mild regurgitation. - Left atrium: The atrium was severely dilated. - Pulmonary arteries: PA peak pressure: 32 mm Hg (S). Past Medical History:  Diagnosis Date  . Acute pancreatitis    hx of   . Arthritis    "qwhere" (07/04/2018)  . Chronic back pain    "all over" (07/04/2018)  . Chronic diastolic CHF (congestive heart failure) ([place])   . Chronic kidney disease    stage  3 chronic kidney disease   . Complication of anesthesia    "I have a hard time waking up"  . Degenerative joint disease of shoulder region   . Dry eye syndrome   . Dyspnea   . Family history of adverse reaction to anesthesia    "daughter has the shakes when she wakes up"  . GERD (gastroesophageal reflux  disease)   . Heart murmur   . High cholesterol   . History of hiatal hernia   . HOH (hard of hearing)   . Hypertension   . Impacted cerumen of both ears    hx of   . Muscle weakness (generalized)   . Osteoarthritis   . Overactive bladder   . Pancreatitis    hx of   . Phlebitis    "BLE"  . Rotator cuff tear    right   . Tear of right supraspinatus tendon   . Thrombocytopenia  (HCC)    hx of   . Type II diabetes mellitus ([place])   . Urinary tract infection    hx of   . Xerosis cutis    hx of     Past Surgical History:  Procedure Laterality Date  . ABDOMINAL HYSTERECTOMY    . BACK SURGERY    . CATARACT EXTRACTION W/ INTRAOCULAR LENS  IMPLANT, BILATERAL Bilateral   . CHOLECYSTECTOMY  2016  . ERCP N/A 08/30/2018   Procedure: ENDOSCOPIC RETROGRADE CHOLANGIOPANCREATOGRAPHY (ERCP);  Surgeon: Clarene Essex, MD;  Location: [facility];  Service: Endoscopy;  Laterality: N/A;  . FIXATION KYPHOPLASTY    . JOINT REPLACEMENT    . LAPAROSCOPIC CHOLECYSTECTOMY SINGLE SITE WITH INTRAOPERATIVE CHOLANGIOGRAM N/A 04/11/2015   Procedure: LAPAROSCOPIC LYSIS OF ADHESIONS, LAPAROSCOPIC CHOLECYSTECTOMY WITH INTRAOPERATIVE CHOLANGIOGRAM;  Surgeon: Michael Boston, MD;  [facility];  Service: General;  Laterality: N/A;  . PANCREATIC STENT PLACEMENT  08/30/2018   Procedure: PANCREATIC STENT PLACEMENT;  Surgeon: Clarene Essex, MD;  [facility];  Service: Endoscopy;;  . REMOVAL OF STONES  08/30/2018   Procedure: REMOVAL OF STONES;  Surgeon: Clarene Essex, MD;  [facility];  Service: Endoscopy;;  . SHOULDER OPEN ROTATOR CUFF REPAIR Bilateral   . SPHINCTEROTOMY  08/30/2018   Procedure: SPHINCTEROTOMY;  Surgeon: Clarene Essex, MD;  [facility];  Service: Endoscopy;;  . TOTAL KNEE ARTHROPLASTY Bilateral     MEDICATIONS: No current facility-administered medications for this encounter.    Marland Kitchen acetaminophen (TYLENOL) 325 MG tablet  . albuterol (PROVENTIL HFA;VENTOLIN HFA) 108 (90 Base) MCG/ACT inhaler  . amLODipine (NORVASC) 5 MG tablet  . aspirin EC 81 MG tablet  . Cholecalciferol (VITAMIN D-3) 125 MCG (5000 UT) TABS  . ciprofloxacin (CIPRO) 500 MG tablet  . Emollient (AQUAPHOR EX)  . famotidine (PEPCID) 10 MG tablet  . Ferrous Gluconate (IRON) 240 (27 FE) MG TABS  . folic acid (FOLVITE) 081 MCG tablet  . hydrochlorothiazide (HYDRODIURIL) 12.5 MG tablet  .  JANUVIA 50 MG tablet  . Lido-Capsaicin-Men-Methyl Sal (1ST MEDX-PATCH/ LIDOCAINE) 4-0.025-5-20 % PTCH  . loratadine (CLARITIN) 10 MG tablet  . magnesium oxide (MAG-OX) 400 (241.3 Mg) MG tablet  . pantoprazole (PROTONIX) 40 MG tablet  . Polyethyl Glycol-Propyl Glycol (SYSTANE) 0.4-0.3 % SOLN  . polyethylene glycol (MIRALAX / GLYCOLAX) packet  . senna-docusate (SENOKOT-S) 8.6-50 MG tablet  . vitamin B-12 (CYANOCOBALAMIN) 1000 MCG tablet  . acetaminophen (TYLENOL) 500 MG tablet  . hydrochlorothiazide 10 mg/mL SUSP    [facility]Surgical [facility] [phone] 11/04/18 10:48 AM

## 2018-11-08 ENCOUNTER — Encounter (HOSPITAL_COMMUNITY): Payer: Self-pay | Admitting: *Deleted

## 2018-11-08 ENCOUNTER — Other Ambulatory Visit: Payer: Self-pay

## 2018-11-08 ENCOUNTER — Inpatient Hospital Stay (HOSPITAL_COMMUNITY)
Admission: AD | Admit: 2018-11-08 | Discharge: 2018-11-11 | DRG: 464 | Disposition: A | Discharge status: Skilled Nursing Facility | Payer: Medicare Other | Attending: Orthopedic Surgery | Admitting: Orthopedic Surgery

## 2018-11-08 ENCOUNTER — Encounter (HOSPITAL_COMMUNITY)
Admission: AD | Disposition: A | Discharge status: Skilled Nursing Facility | Payer: Self-pay | Source: Home / Self Care | Attending: Orthopedic Surgery

## 2018-11-08 ENCOUNTER — Ambulatory Visit (HOSPITAL_COMMUNITY): Payer: Medicare Other | Admitting: Physician Assistant

## 2018-11-08 DIAGNOSIS — Z9841 Cataract extraction status, right eye: Secondary | ICD-10-CM

## 2018-11-08 DIAGNOSIS — T8459XA Infection and inflammatory reaction due to other internal joint prosthesis, initial encounter: Principal | ICD-10-CM | POA: Diagnosis present

## 2018-11-08 DIAGNOSIS — Z885 Allergy status to narcotic agent status: Secondary | ICD-10-CM

## 2018-11-08 DIAGNOSIS — Z7984 Long term (current) use of oral hypoglycemic drugs: Secondary | ICD-10-CM | POA: Diagnosis not present

## 2018-11-08 DIAGNOSIS — B9620 Unspecified Escherichia coli [E. coli] as the cause of diseases classified elsewhere: Secondary | ICD-10-CM | POA: Diagnosis present

## 2018-11-08 DIAGNOSIS — Z882 Allergy status to sulfonamides status: Secondary | ICD-10-CM | POA: Diagnosis not present

## 2018-11-08 DIAGNOSIS — E559 Vitamin D deficiency, unspecified: Secondary | ICD-10-CM | POA: Diagnosis not present

## 2018-11-08 DIAGNOSIS — T8149XA Infection following a procedure, other surgical site, initial encounter: Secondary | ICD-10-CM | POA: Diagnosis not present

## 2018-11-08 DIAGNOSIS — M25511 Pain in right shoulder: Secondary | ICD-10-CM | POA: Diagnosis not present

## 2018-11-08 DIAGNOSIS — H04129 Dry eye syndrome of unspecified lacrimal gland: Secondary | ICD-10-CM | POA: Diagnosis present

## 2018-11-08 DIAGNOSIS — R2689 Other abnormalities of gait and mobility: Secondary | ICD-10-CM | POA: Diagnosis not present

## 2018-11-08 DIAGNOSIS — Z9842 Cataract extraction status, left eye: Secondary | ICD-10-CM | POA: Diagnosis not present

## 2018-11-08 DIAGNOSIS — M009 Pyogenic arthritis, unspecified: Secondary | ICD-10-CM | POA: Diagnosis present

## 2018-11-08 DIAGNOSIS — Z9049 Acquired absence of other specified parts of digestive tract: Secondary | ICD-10-CM

## 2018-11-08 DIAGNOSIS — I130 Hypertensive heart and chronic kidney disease with heart failure and stage 1 through stage 4 chronic kidney disease, or unspecified chronic kidney disease: Secondary | ICD-10-CM | POA: Diagnosis present

## 2018-11-08 DIAGNOSIS — I509 Heart failure, unspecified: Secondary | ICD-10-CM | POA: Diagnosis not present

## 2018-11-08 DIAGNOSIS — N3281 Overactive bladder: Secondary | ICD-10-CM | POA: Diagnosis present

## 2018-11-08 DIAGNOSIS — T8450XA Infection and inflammatory reaction due to unspecified internal joint prosthesis, initial encounter: Secondary | ICD-10-CM | POA: Diagnosis present

## 2018-11-08 DIAGNOSIS — I10 Essential (primary) hypertension: Secondary | ICD-10-CM | POA: Diagnosis not present

## 2018-11-08 DIAGNOSIS — K219 Gastro-esophageal reflux disease without esophagitis: Secondary | ICD-10-CM | POA: Diagnosis not present

## 2018-11-08 DIAGNOSIS — D529 Folate deficiency anemia, unspecified: Secondary | ICD-10-CM | POA: Diagnosis not present

## 2018-11-08 DIAGNOSIS — Z8672 Personal history of thrombophlebitis: Secondary | ICD-10-CM | POA: Diagnosis not present

## 2018-11-08 DIAGNOSIS — Z961 Presence of intraocular lens: Secondary | ICD-10-CM | POA: Diagnosis present

## 2018-11-08 DIAGNOSIS — L089 Local infection of the skin and subcutaneous tissue, unspecified: Secondary | ICD-10-CM | POA: Diagnosis not present

## 2018-11-08 DIAGNOSIS — Z7982 Long term (current) use of aspirin: Secondary | ICD-10-CM

## 2018-11-08 DIAGNOSIS — M2550 Pain in unspecified joint: Secondary | ICD-10-CM | POA: Diagnosis not present

## 2018-11-08 DIAGNOSIS — Z4789 Encounter for other orthopedic aftercare: Secondary | ICD-10-CM | POA: Diagnosis not present

## 2018-11-08 DIAGNOSIS — Z888 Allergy status to other drugs, medicaments and biological substances status: Secondary | ICD-10-CM

## 2018-11-08 DIAGNOSIS — I5032 Chronic diastolic (congestive) heart failure: Secondary | ICD-10-CM | POA: Diagnosis not present

## 2018-11-08 DIAGNOSIS — Z96653 Presence of artificial knee joint, bilateral: Secondary | ICD-10-CM | POA: Diagnosis present

## 2018-11-08 DIAGNOSIS — H9190 Unspecified hearing loss, unspecified ear: Secondary | ICD-10-CM | POA: Diagnosis not present

## 2018-11-08 DIAGNOSIS — R410 Disorientation, unspecified: Secondary | ICD-10-CM | POA: Diagnosis not present

## 2018-11-08 DIAGNOSIS — Z90710 Acquired absence of both cervix and uterus: Secondary | ICD-10-CM | POA: Diagnosis not present

## 2018-11-08 DIAGNOSIS — R279 Unspecified lack of coordination: Secondary | ICD-10-CM | POA: Diagnosis not present

## 2018-11-08 DIAGNOSIS — R4182 Altered mental status, unspecified: Secondary | ICD-10-CM | POA: Diagnosis not present

## 2018-11-08 DIAGNOSIS — N183 Chronic kidney disease, stage 3 (moderate): Secondary | ICD-10-CM | POA: Diagnosis not present

## 2018-11-08 DIAGNOSIS — Z79899 Other long term (current) drug therapy: Secondary | ICD-10-CM

## 2018-11-08 DIAGNOSIS — R5381 Other malaise: Secondary | ICD-10-CM | POA: Diagnosis not present

## 2018-11-08 DIAGNOSIS — E119 Type 2 diabetes mellitus without complications: Secondary | ICD-10-CM | POA: Diagnosis not present

## 2018-11-08 DIAGNOSIS — E7800 Pure hypercholesterolemia, unspecified: Secondary | ICD-10-CM | POA: Diagnosis present

## 2018-11-08 DIAGNOSIS — M6281 Muscle weakness (generalized): Secondary | ICD-10-CM | POA: Diagnosis not present

## 2018-11-08 DIAGNOSIS — E1122 Type 2 diabetes mellitus with diabetic chronic kidney disease: Secondary | ICD-10-CM | POA: Diagnosis present

## 2018-11-08 DIAGNOSIS — Z7401 Bed confinement status: Secondary | ICD-10-CM | POA: Diagnosis not present

## 2018-11-08 DIAGNOSIS — D519 Vitamin B12 deficiency anemia, unspecified: Secondary | ICD-10-CM | POA: Diagnosis not present

## 2018-11-08 HISTORY — DX: Overactive bladder: N32.81

## 2018-11-08 HISTORY — DX: Urinary tract infection, site not specified: N39.0

## 2018-11-08 HISTORY — DX: Acute pancreatitis without necrosis or infection, unspecified: K85.90

## 2018-11-08 HISTORY — DX: Dyspnea, unspecified: R06.00

## 2018-11-08 HISTORY — DX: Impacted cerumen, bilateral: H61.23

## 2018-11-08 HISTORY — DX: Xerosis cutis: L85.3

## 2018-11-08 HISTORY — DX: Thrombocytopenia, unspecified: D69.6

## 2018-11-08 HISTORY — PX: INCISION AND DRAINAGE: SHX5863

## 2018-11-08 HISTORY — DX: Dry eye syndrome of unspecified lacrimal gland: H04.129

## 2018-11-08 HISTORY — DX: Unspecified rotator cuff tear or rupture of unspecified shoulder, not specified as traumatic: M75.100

## 2018-11-08 HISTORY — DX: Unspecified rotator cuff tear or rupture of right shoulder, not specified as traumatic: M75.101

## 2018-11-08 HISTORY — DX: Muscle weakness (generalized): M62.81

## 2018-11-08 LAB — BASIC METABOLIC PANEL
Anion gap: 11 (ref 5–15)
BUN: 13 mg/dL (ref 8–23)
CO2: 27 mmol/L (ref 22–32)
Calcium: 9.5 mg/dL (ref 8.9–10.3)
Chloride: 98 mmol/L (ref 98–111)
Creatinine, Ser: 0.63 mg/dL (ref 0.44–1.00)
GFR calc Af Amer: >60 mL/min (ref >60)
GFR calc non Af Amer: >60 mL/min (ref >60)
GLUCOSE: 186 mg/dL — AB (ref 70–99)
Potassium: 3.9 mmol/L (ref 3.5–5.1)
Sodium: 136 mmol/L (ref 135–145)

## 2018-11-08 LAB — GLUCOSE, CAPILLARY
Glucose-Capillary: 189 mg/dL — ABNORMAL HIGH (ref 70–99)
Glucose-Capillary: 194 mg/dL — ABNORMAL HIGH (ref 70–99)

## 2018-11-08 LAB — HEMOGLOBIN A1C
Hgb A1c MFr Bld: 7.3 % — ABNORMAL HIGH (ref 4.8–5.6)
Mean Plasma Glucose: 162.81 mg/dL

## 2018-11-08 LAB — CBC
HCT: 45.5 % (ref 36.0–46.0)
Hemoglobin: 14.1 g/dL (ref 12.0–15.0)
MCH: 29.5 pg (ref 26.0–34.0)
MCHC: 31 g/dL (ref 30.0–36.0)
MCV: 95.2 fL (ref 80.0–100.0)
Platelets: 187 10*3/uL (ref 150–400)
RBC: 4.78 MIL/uL (ref 3.87–5.11)
RDW: 13.9 % (ref 11.5–15.5)
WBC: 9.7 10*3/uL (ref 4.0–10.5)
nRBC: 0 % (ref 0.0–0.2)

## 2018-11-08 SURGERY — INCISION AND DRAINAGE
Anesthesia: General | Site: Shoulder | Laterality: Right

## 2018-11-08 MED ORDER — SENNOSIDES-DOCUSATE SODIUM 8.6-50 MG PO TABS: 2.0000 | ORAL_TABLET | Freq: Two times a day (BID) | ORAL | Status: DC | PRN

## 2018-11-08 MED ORDER — VITAMIN B-12 1000 MCG PO TABS
1000.0000 ug | ORAL_TABLET | Freq: Every day | ORAL | Status: DC
  Administered 2018-11-08 – 2018-11-11 (×4): 1000 ug via ORAL
  Filled 2018-11-08 (×4): qty 1

## 2018-11-08 MED ORDER — TOBRAMYCIN SULFATE 1.2 G IJ SOLR
INTRAMUSCULAR | Status: DC | PRN
  Administered 2018-11-08: 1.2 g

## 2018-11-08 MED ORDER — ONDANSETRON HCL 4 MG PO TABS: 4.0000 mg | ORAL_TABLET | Freq: Four times a day (QID) | ORAL | Status: DC | PRN

## 2018-11-08 MED ORDER — SODIUM CHLORIDE 0.9 % IR SOLN
Status: DC | PRN
  Administered 2018-11-08: 3000 mL

## 2018-11-08 MED ORDER — MIDAZOLAM HCL 2 MG/2ML IJ SOLN
INTRAMUSCULAR | Status: AC
  Filled 2018-11-08: qty 2

## 2018-11-08 MED ORDER — LACTATED RINGERS IV SOLN
INTRAVENOUS | Status: DC | PRN
  Administered 2018-11-08: 07:00:00 via INTRAVENOUS

## 2018-11-08 MED ORDER — CEFAZOLIN SODIUM-DEXTROSE 2-4 GM/100ML-% IV SOLN
2.0000 g | INTRAVENOUS | Status: DC
  Filled 2018-11-08: qty 100

## 2018-11-08 MED ORDER — CIPROFLOXACIN HCL 500 MG PO TABS
500.0000 mg | ORAL_TABLET | Freq: Two times a day (BID) | ORAL | Status: DC
  Administered 2018-11-08 – 2018-11-11 (×6): 500 mg via ORAL
  Filled 2018-11-08 (×7): qty 1

## 2018-11-08 MED ORDER — TRAMADOL HCL 50 MG PO TABS: 50.0000 mg | ORAL_TABLET | Freq: Four times a day (QID) | ORAL | 0 refills | Status: DC | PRN

## 2018-11-08 MED ORDER — PHENYLEPHRINE 40 MCG/ML (10ML) SYRINGE FOR IV PUSH (FOR BLOOD PRESSURE SUPPORT)
PREFILLED_SYRINGE | INTRAVENOUS | Status: DC | PRN
  Administered 2018-11-08 (×2): 40 ug via INTRAVENOUS
  Administered 2018-11-08: 240 ug via INTRAVENOUS
  Administered 2018-11-08: 40 ug via INTRAVENOUS

## 2018-11-08 MED ORDER — HYDROCHLOROTHIAZIDE 10 MG/ML ORAL SUSPENSION
6.2500 mg | Freq: Every day | ORAL | Status: DC
  Administered 2018-11-08 – 2018-11-11 (×4): 6.25 mg via ORAL
  Filled 2018-11-08 (×6): qty 1.25

## 2018-11-08 MED ORDER — ONDANSETRON HCL 4 MG/2ML IJ SOLN
INTRAMUSCULAR | Status: AC
  Filled 2018-11-08: qty 2

## 2018-11-08 MED ORDER — LIDO-CAPSAICIN-MEN-METHYL SAL 4-0.025-5-20 % EX PTCH: 1.0000 | MEDICATED_PATCH | Freq: Every day | CUTANEOUS | Status: DC

## 2018-11-08 MED ORDER — LORATADINE 10 MG PO TABS
10.0000 mg | ORAL_TABLET | Freq: Every day | ORAL | Status: DC
  Administered 2018-11-08 – 2018-11-10 (×3): 10 mg via ORAL
  Filled 2018-11-08 (×3): qty 1

## 2018-11-08 MED ORDER — ROCURONIUM BROMIDE 10 MG/ML (PF) SYRINGE
PREFILLED_SYRINGE | INTRAVENOUS | Status: AC
  Filled 2018-11-08: qty 10

## 2018-11-08 MED ORDER — TRAMADOL HCL 50 MG PO TABS
50.0000 mg | ORAL_TABLET | Freq: Four times a day (QID) | ORAL | Status: DC | PRN
  Administered 2018-11-08 – 2018-11-10 (×7): 50 mg via ORAL
  Filled 2018-11-08 (×7): qty 1

## 2018-11-08 MED ORDER — DOCUSATE SODIUM 100 MG PO CAPS
100.0000 mg | ORAL_CAPSULE | Freq: Two times a day (BID) | ORAL | Status: DC
  Administered 2018-11-08 – 2018-11-11 (×6): 100 mg via ORAL
  Filled 2018-11-08 (×6): qty 1

## 2018-11-08 MED ORDER — LINAGLIPTIN 5 MG PO TABS
5.0000 mg | ORAL_TABLET | Freq: Every day | ORAL | Status: DC
  Administered 2018-11-09 – 2018-11-11 (×3): 5 mg via ORAL
  Filled 2018-11-08 (×3): qty 1

## 2018-11-08 MED ORDER — SUGAMMADEX SODIUM 200 MG/2ML IV SOLN
INTRAVENOUS | Status: AC
  Filled 2018-11-08: qty 2

## 2018-11-08 MED ORDER — ONDANSETRON HCL 4 MG/2ML IJ SOLN
INTRAMUSCULAR | Status: DC | PRN
  Administered 2018-11-08: 4 mg via INTRAVENOUS

## 2018-11-08 MED ORDER — DEXTROSE 5 % IV SOLN
INTRAVENOUS | Status: DC | PRN
  Administered 2018-11-08: 1 g via INTRAVENOUS

## 2018-11-08 MED ORDER — METOCLOPRAMIDE HCL 5 MG/ML IJ SOLN: 5.0000 mg | Freq: Three times a day (TID) | INTRAMUSCULAR | Status: DC | PRN

## 2018-11-08 MED ORDER — FOLIC ACID 0.5 MG HALF TAB
500.0000 ug | ORAL_TABLET | Freq: Every day | ORAL | Status: DC
  Administered 2018-11-08 – 2018-11-11 (×4): 0.5 mg via ORAL
  Filled 2018-11-08 (×4): qty 1

## 2018-11-08 MED ORDER — ONDANSETRON HCL 4 MG/2ML IJ SOLN: 4.0000 mg | Freq: Four times a day (QID) | INTRAMUSCULAR | Status: DC | PRN

## 2018-11-08 MED ORDER — PHENOL 1.4 % MT LIQD
1.0000 | OROMUCOSAL | Status: DC | PRN
  Filled 2018-11-08: qty 177

## 2018-11-08 MED ORDER — LIDOCAINE 5 % EX PTCH
1.0000 | MEDICATED_PATCH | CUTANEOUS | Status: DC
  Administered 2018-11-11: 1 via TRANSDERMAL
  Filled 2018-11-08 (×3): qty 1

## 2018-11-08 MED ORDER — PANTOPRAZOLE SODIUM 40 MG PO TBEC
40.0000 mg | DELAYED_RELEASE_TABLET | Freq: Every day | ORAL | Status: DC
  Administered 2018-11-08 – 2018-11-11 (×4): 40 mg via ORAL
  Filled 2018-11-08 (×4): qty 1

## 2018-11-08 MED ORDER — SUGAMMADEX SODIUM 200 MG/2ML IV SOLN
INTRAVENOUS | Status: DC | PRN
  Administered 2018-11-08: 150 mg via INTRAVENOUS

## 2018-11-08 MED ORDER — FENTANYL CITRATE (PF) 250 MCG/5ML IJ SOLN
INTRAMUSCULAR | Status: DC | PRN
  Administered 2018-11-08 (×2): 50 ug via INTRAVENOUS

## 2018-11-08 MED ORDER — MAGNESIUM OXIDE 400 (241.3 MG) MG PO TABS
400.0000 mg | ORAL_TABLET | Freq: Every day | ORAL | Status: DC
  Administered 2018-11-08 – 2018-11-11 (×4): 400 mg via ORAL
  Filled 2018-11-08 (×4): qty 1

## 2018-11-08 MED ORDER — FENTANYL CITRATE (PF) 100 MCG/2ML IJ SOLN
INTRAMUSCULAR | Status: AC
  Filled 2018-11-08: qty 2

## 2018-11-08 MED ORDER — MENTHOL 3 MG MT LOZG: 1.0000 | LOZENGE | OROMUCOSAL | Status: DC | PRN

## 2018-11-08 MED ORDER — FERROUS SULFATE 300 (60 FE) MG/5ML PO SYRP
300.0000 mg | ORAL_SOLUTION | Freq: Every day | ORAL | Status: DC
  Administered 2018-11-08 – 2018-11-11 (×4): 300 mg via ORAL
  Filled 2018-11-08 (×4): qty 5

## 2018-11-08 MED ORDER — CHLORHEXIDINE GLUCONATE 4 % EX LIQD: 60.0000 mL | Freq: Once | CUTANEOUS | Status: DC

## 2018-11-08 MED ORDER — SODIUM CHLORIDE 0.9 % IV SOLN: INTRAVENOUS | Status: DC

## 2018-11-08 MED ORDER — CIPROFLOXACIN HCL 500 MG PO TABS: 500.0000 mg | ORAL_TABLET | Freq: Two times a day (BID) | ORAL | 0 refills | Status: DC

## 2018-11-08 MED ORDER — FERROUS SULFATE 220 (44 FE) MG/5ML PO ELIX
220.0000 mg | ORAL_SOLUTION | Freq: Every day | ORAL | Status: DC
  Filled 2018-11-08: qty 5

## 2018-11-08 MED ORDER — ROCURONIUM BROMIDE 10 MG/ML (PF) SYRINGE
PREFILLED_SYRINGE | INTRAVENOUS | Status: DC | PRN
  Administered 2018-11-08: 10 mg via INTRAVENOUS
  Administered 2018-11-08: 30 mg via INTRAVENOUS

## 2018-11-08 MED ORDER — DEXAMETHASONE SODIUM PHOSPHATE 10 MG/ML IJ SOLN
INTRAMUSCULAR | Status: DC | PRN
  Administered 2018-11-08: 4 mg via INTRAVENOUS

## 2018-11-08 MED ORDER — DEXAMETHASONE SODIUM PHOSPHATE 10 MG/ML IJ SOLN
INTRAMUSCULAR | Status: AC
  Filled 2018-11-08: qty 1

## 2018-11-08 MED ORDER — FAMOTIDINE 20 MG PO TABS
20.0000 mg | ORAL_TABLET | Freq: Every day | ORAL | Status: DC
  Administered 2018-11-08 – 2018-11-10 (×3): 20 mg via ORAL
  Filled 2018-11-08 (×3): qty 1

## 2018-11-08 MED ORDER — PROPOFOL 10 MG/ML IV BOLUS
INTRAVENOUS | Status: DC | PRN
  Administered 2018-11-08: 10 mg via INTRAVENOUS
  Administered 2018-11-08: 30 mg via INTRAVENOUS

## 2018-11-08 MED ORDER — POLYVINYL ALCOHOL 1.4 % OP SOLN
2.0000 [drp] | OPHTHALMIC | Status: DC | PRN
  Administered 2018-11-08 – 2018-11-10 (×3): 2 [drp] via OPHTHALMIC
  Filled 2018-11-08 (×2): qty 15

## 2018-11-08 MED ORDER — ASPIRIN EC 81 MG PO TBEC
81.0000 mg | DELAYED_RELEASE_TABLET | Freq: Every day | ORAL | Status: DC
  Administered 2018-11-09 – 2018-11-11 (×3): 81 mg via ORAL
  Filled 2018-11-08 (×3): qty 1

## 2018-11-08 MED ORDER — FENTANYL CITRATE (PF) 100 MCG/2ML IJ SOLN
INTRAMUSCULAR | Status: AC
  Administered 2018-11-08: 25 ug via INTRAVENOUS
  Filled 2018-11-08: qty 2

## 2018-11-08 MED ORDER — ETOMIDATE 2 MG/ML IV SOLN
INTRAVENOUS | Status: DC | PRN
  Administered 2018-11-08: 8 mg via INTRAVENOUS

## 2018-11-08 MED ORDER — VITAMIN D 25 MCG (1000 UNIT) PO TABS
5000.0000 [IU] | ORAL_TABLET | Freq: Every day | ORAL | Status: DC
  Administered 2018-11-08 – 2018-11-11 (×4): 5000 [IU] via ORAL
  Filled 2018-11-08 (×4): qty 5

## 2018-11-08 MED ORDER — AMLODIPINE BESYLATE 5 MG PO TABS
5.0000 mg | ORAL_TABLET | Freq: Every day | ORAL | Status: DC
  Administered 2018-11-08 – 2018-11-11 (×4): 5 mg via ORAL
  Filled 2018-11-08 (×4): qty 1

## 2018-11-08 MED ORDER — AQUAPHOR EX OINT
TOPICAL_OINTMENT | Freq: Every day | CUTANEOUS | Status: DC
  Administered 2018-11-08: 15:00:00 via TOPICAL
  Administered 2018-11-09: 1 via TOPICAL
  Administered 2018-11-10 – 2018-11-11 (×2): via TOPICAL
  Filled 2018-11-08: qty 50

## 2018-11-08 MED ORDER — 0.9 % SODIUM CHLORIDE (POUR BTL) OPTIME
TOPICAL | Status: DC | PRN
  Administered 2018-11-08: 1000 mL

## 2018-11-08 MED ORDER — ACETAMINOPHEN 325 MG PO TABS
650.0000 mg | ORAL_TABLET | Freq: Three times a day (TID) | ORAL | Status: DC | PRN
  Administered 2018-11-08: 650 mg via ORAL
  Filled 2018-11-08: qty 2

## 2018-11-08 MED ORDER — SUCCINYLCHOLINE CHLORIDE 200 MG/10ML IV SOSY
PREFILLED_SYRINGE | INTRAVENOUS | Status: DC | PRN
  Administered 2018-11-08: 120 mg via INTRAVENOUS

## 2018-11-08 MED ORDER — POLYETHYLENE GLYCOL 3350 17 G PO PACK: 17.0000 g | PACK | Freq: Every day | ORAL | Status: DC | PRN

## 2018-11-08 MED ORDER — ALBUTEROL SULFATE (2.5 MG/3ML) 0.083% IN NEBU: 3.0000 mL | INHALATION_SOLUTION | Freq: Four times a day (QID) | RESPIRATORY_TRACT | Status: DC | PRN

## 2018-11-08 MED ORDER — PROPOFOL 10 MG/ML IV BOLUS
INTRAVENOUS | Status: AC
  Filled 2018-11-08: qty 20

## 2018-11-08 MED ORDER — LIDOCAINE 2% (20 MG/ML) 5 ML SYRINGE
INTRAMUSCULAR | Status: AC
  Filled 2018-11-08: qty 5

## 2018-11-08 MED ORDER — FENTANYL CITRATE (PF) 100 MCG/2ML IJ SOLN
25.0000 ug | INTRAMUSCULAR | Status: DC | PRN
  Administered 2018-11-08: 25 ug via INTRAVENOUS

## 2018-11-08 MED ORDER — SODIUM CHLORIDE 0.9 % IV SOLN
INTRAVENOUS | Status: AC
  Filled 2018-11-08: qty 20

## 2018-11-08 MED ORDER — LACTATED RINGERS IV SOLN
INTRAVENOUS | Status: DC
  Administered 2018-11-08 (×2): via INTRAVENOUS

## 2018-11-08 MED ORDER — EPHEDRINE SULFATE-NACL 50-0.9 MG/10ML-% IV SOSY
PREFILLED_SYRINGE | INTRAVENOUS | Status: DC | PRN
  Administered 2018-11-08: 10 mg via INTRAVENOUS
  Administered 2018-11-08: 15 mg via INTRAVENOUS
  Administered 2018-11-08: 10 mg via INTRAVENOUS

## 2018-11-08 MED ORDER — METOCLOPRAMIDE HCL 5 MG PO TABS: 5.0000 mg | ORAL_TABLET | Freq: Three times a day (TID) | ORAL | Status: DC | PRN

## 2018-11-08 MED ORDER — SUCCINYLCHOLINE CHLORIDE 200 MG/10ML IV SOSY
PREFILLED_SYRINGE | INTRAVENOUS | Status: AC
  Filled 2018-11-08: qty 10

## 2018-11-08 MED ORDER — TOBRAMYCIN SULFATE 1.2 G IJ SOLR
INTRAMUSCULAR | Status: AC
  Filled 2018-11-08: qty 1.2

## 2018-11-08 SURGICAL SUPPLY — 61 items
APL PRP STRL LF DISP 70% ISPRP (MISCELLANEOUS) ×1
BAG SPEC THK2 15X12 ZIP CLS (MISCELLANEOUS) ×1
BAG ZIPLOCK 12X15 (MISCELLANEOUS) ×2 IMPLANT
CHLORAPREP W/TINT 26 (MISCELLANEOUS) ×2 IMPLANT
CLEANER TIP ELECTROSURG 2X2 (MISCELLANEOUS) ×1 IMPLANT
COVER SURGICAL LIGHT HANDLE (MISCELLANEOUS) ×2 IMPLANT
COVER WAND RF STERILE (DRAPES) IMPLANT
Calcigen S ×1 IMPLANT
DECANTER SPIKE VIAL GLASS SM (MISCELLANEOUS) ×2 IMPLANT
DRAPE ORTHO SPLIT 77X108 STRL (DRAPES) ×4
DRAPE SURG ORHT 6 SPLT 77X108 (DRAPES) ×2 IMPLANT
DRAPE U-SHAPE 47X51 STRL (DRAPES) ×2 IMPLANT
DRSG ADAPTIC 3X8 NADH LF (GAUZE/BANDAGES/DRESSINGS) ×1 IMPLANT
DRSG MEPILEX BORDER 4X8 (GAUZE/BANDAGES/DRESSINGS) ×2 IMPLANT
DRSG PAD ABDOMINAL 8X10 ST (GAUZE/BANDAGES/DRESSINGS) ×1 IMPLANT
ELECT BLADE TIP CTD 4 INCH (ELECTRODE) ×2 IMPLANT
ELECT NDL TIP 2.8 STRL (NEEDLE) ×1 IMPLANT
ELECT NEEDLE TIP 2.8 STRL (NEEDLE) ×2 IMPLANT
ELECT REM PT RETURN 15FT ADLT (MISCELLANEOUS) ×2 IMPLANT
FACESHIELD WRAPAROUND (MASK) ×2 IMPLANT
FACESHIELD WRAPAROUND OR TEAM (MASK) ×1 IMPLANT
GAUZE SPONGE 4X4 12PLY STRL (GAUZE/BANDAGES/DRESSINGS) ×1 IMPLANT
GLOVE BIOGEL PI ORTHO PRO 7.5 (GLOVE) ×1
GLOVE BIOGEL PI ORTHO PRO SZ8 (GLOVE) ×1
GLOVE ORTHO TXT STRL SZ7.5 (GLOVE) ×2 IMPLANT
GLOVE PI ORTHO PRO STRL 7.5 (GLOVE) ×1 IMPLANT
GLOVE PI ORTHO PRO STRL SZ8 (GLOVE) ×1 IMPLANT
GLOVE SURG ORTHO 8.5 STRL (GLOVE) ×2 IMPLANT
GOWN STRL REUS W/ TWL XL LVL3 (GOWN DISPOSABLE) ×2 IMPLANT
GOWN STRL REUS W/TWL XL LVL3 (GOWN DISPOSABLE) ×4
GRAFT BONE SUB CALCIGEN 10 (Bone Implant) ×1 IMPLANT
GRAFT BONE SUB CALCIGEN 5 (Bone Implant) ×1 IMPLANT
KIT BASIN OR (CUSTOM PROCEDURE TRAY) ×2 IMPLANT
KIT TURNOVER KIT A (KITS) IMPLANT
MANIFOLD NEPTUNE II (INSTRUMENTS) ×2 IMPLANT
NDL MAYO CATGUT SZ4 TPR NDL (NEEDLE) ×1 IMPLANT
NEEDLE MAYO CATGUT SZ4 (NEEDLE) ×2 IMPLANT
PACK SHOULDER (CUSTOM PROCEDURE TRAY) ×2 IMPLANT
PASSER SUT SWANSON 36MM LOOP (INSTRUMENTS) ×1 IMPLANT
PROTECTOR NERVE ULNAR (MISCELLANEOUS) ×2 IMPLANT
SLING ARM FOAM STRAP LRG (SOFTGOODS) ×2 IMPLANT
SMARTMIX MINI TOWER (MISCELLANEOUS)
SPONGE SURGIFOAM ABS GEL 100 (HEMOSTASIS) ×1 IMPLANT
SUCTION FRAZIER HANDLE 12FR (TUBING) ×1
SUCTION TUBE FRAZIER 12FR DISP (TUBING) ×1 IMPLANT
SUT ETHIBOND NAB CT1 #1 30IN (SUTURE) ×4 IMPLANT
SUT ETHILON 2 0 PS N (SUTURE) ×2 IMPLANT
SUT FIBERWIRE #2 38 T-5 BLUE (SUTURE)
SUT MNCRL AB 4-0 PS2 18 (SUTURE) ×2 IMPLANT
SUT SILK 2 0 SH CR/8 (SUTURE) ×1 IMPLANT
SUT TICRON (SUTURE) ×2 IMPLANT
SUT VIC AB 0 CT1 36 (SUTURE) ×4 IMPLANT
SUT VIC AB 2-0 CT1 27 (SUTURE)
SUT VIC AB 2-0 CT1 TAPERPNT 27 (SUTURE) ×2 IMPLANT
SUTURE FIBERWR #2 38 T-5 BLUE (SUTURE) ×8 IMPLANT
SYR CONTROL 10ML LL (SYRINGE) ×2 IMPLANT
TAPE CLOTH SURG 4X10 WHT LF (GAUZE/BANDAGES/DRESSINGS) ×1 IMPLANT
TOWEL OR 17X26 10 PK STRL BLUE (TOWEL DISPOSABLE) ×2 IMPLANT
TOWER CARTRIDGE SMART MIX (DISPOSABLE) ×1 IMPLANT
TOWER SMARTMIX MINI (MISCELLANEOUS) IMPLANT
TRAY BONE MOLDING CALCIGEN (MISCELLANEOUS) ×1 IMPLANT

## 2018-11-08 NOTE — Brief Op Note (Signed)
11/08/2018  8:53 AM  PATIENT:  [age][place][age] y.o. female  PRE-OPERATIVE DIAGNOSIS:  Right shoulder infection  POST-OPERATIVE DIAGNOSIS:  Right shoulder infection  PROCEDURE:  Procedure(s): INCISION AND DRAINAGE right shoulder, placement of antibiotic beads  (Right)  Deep cultures  SURGEON:  Surgeon(s) and Role:    [name], MD - Primary  PHYSICIAN ASSISTANT:   ASSISTANTS: [place] Bruns, PA-C   ANESTHESIA:   general  EBL:  50 mL   BLOOD ADMINISTERED:none  DRAINS: none   LOCAL MEDICATIONS USED:  NONE  SPECIMEN:  Source of Specimen:  Right shoulder  DISPOSITION OF SPECIMEN:  micro  COUNTS:  YES  TOURNIQUET:  * No tourniquets in log *  DICTATION: .Other Dictation: Dictation Number [id number]  PLAN OF CARE: Admit to inpatient   PATIENT DISPOSITION:  PACU - hemodynamically stable.   Delay start of Pharmacological VTE agent (>24hrs) due to surgical blood loss or risk of bleeding: not applicable

## 2018-11-08 NOTE — Op Note (Signed)
NAMEEVANGELYN, CROUSE MEDICAL RECORD [id number] ACCOUNT [id number] DATE OF BIRTH:1921/02/01 [facility] LOCATION: [facility] PHYSICIAN:STEVEN Orlena Sheldon, MD  OPERATIVE REPORT  DATE OF PROCEDURE:  11/08/2018  PREOPERATIVE DIAGNOSIS:  Right shoulder infection, status post hemiarthroplasty, remotely.  POSTOPERATIVE DIAGNOSIS:  Right shoulder infection, status post hemiarthroplasty, remotely.  PROCEDURE PERFORMED:  Right shoulder incision and drainage as well as obtaining deep cultures and placement of antibiotic beads.  ATTENDING SURGEON:  Esmond Plants, MD  ASSISTANT:  Darol Destine, [place], who was scrubbed during the entire procedure and necessary for satisfactory completion of surgery.  ANESTHESIA:  General anesthesia was used.  ESTIMATED BLOOD LOSS:  Less than 100 mL.  FLUID REPLACEMENT:  1000 mL crystalloid.  INSTRUMENT COUNTS:  Correct.  COMPLICATIONS:  No complications.  ANTIBIOTICS:  Perioperative antibiotics given.  INDICATIONS:  The patient is a 85 year old female who presents with a history of a right shoulder hemiarthroplasty done previously for fractures.  She unfortunately over the last several months has developed increasing pain and swelling in her shoulder.   Recent aspiration of her shoulder grew out E. coli that was sensitive, and we placed her on Cipro.  The patient has had ongoing swelling.  We talked over options with her family to include a physician who agreed to a limited procedure, basically  incision and drainage with placement of antibiotic beads and plans for oral suppression, but no plans for significant revision surgery because the patient has a long stem with cables and she also has very little glenoid bone left and really could not be  revised safely to a reverse shoulder arthroplasty.  PLAN:  At this point is conservative.  Informed consent obtained.  DESCRIPTION OF PROCEDURE:  After adequate level of anesthesia was achieved, the patient  was positioned in the modified beach chair position.  Right shoulder correctly identified and sterilely prepped and draped in the usual manner.  Time-out called  verifying correct patient, correct site.  We entered the patient's shoulder using standard deltopectoral approach, a 10 blade for skin incision down through subcutaneous tissues with Bovie.  Identified cephalic vein, took that laterally with the deltoid.   It was injured and ligated with Vicryl.  We next went ahead into the deeper areas and were able to enter the pseudocapsule, and purulent material was retrieved.  We sent the fluid for aerobic, anaerobic and Gram stain and then also obtained some deep  tissue from some of the pseudocapsule and sent that to microbiology for culture and Gram stain.  We then administered Rocephin 1 g, and then we used sharp dissection and sharp debridement to remove all nonviable tissue and suture material.  Pulse  irrigation of 6 L normal saline irrigation followed by administration of tobramycin impregnated calcium beads, which will dissolve over the next 6-10 weeks.  That had tobramycin placed in it, approximately a gram of tobramycin.  We then went ahead and  closed in layers with #1 PDS for the deep muscular deltopectoral layer followed by 2-0 nylon interrupted closure for the subcu and skin.  A sterile compressive bandage was applied.  The patient was transported to recovery room in stable condition.  LN/NUANCE  D:11/08/2018 T:11/08/2018 JOB:[id number]

## 2018-11-08 NOTE — Anesthesia Postprocedure Evaluation (Signed)
Anesthesia Post Note  Patient: Heidi Dominguez  Procedure(s) Performed: INCISION AND DRAINAGE right shoulder, placement of antibiotic beads  (Right Shoulder)     Patient location during evaluation: PACU Anesthesia Type: General Level of consciousness: awake Pain management: pain level controlled Vital Signs Assessment: post-procedure vital signs reviewed and stable Respiratory status: spontaneous breathing Cardiovascular status: stable Postop Assessment: no apparent nausea or vomiting Anesthetic complications: no    Last Vitals:  Vitals:   11/08/18 0554  BP: (!) 173/99  Pulse: (!) 108  Resp: 18  Temp: 36.6 C  SpO2: 92%    Last Pain:  Vitals:   11/08/18 0554  TempSrc: Oral                 Preethi Scantlebury

## 2018-11-08 NOTE — Interval H&P Note (Signed)
History and Physical Interval Note:  11/08/2018 7:26 AM  Heidi Dominguez  has presented today for surgery, with the diagnosis of Right shoulder infection.  The various methods of treatment have been discussed with the patient and family. After consideration of risks, benefits and other options for treatment, the patient has consented to  Procedure(s): INCISION AND DRAINAGE right shoulder (Right) as a surgical intervention.  The patient's history has been reviewed, patient examined, no change in status, stable for surgery.  I have reviewed the patient's chart and labs.  Questions were answered to the patient's satisfaction.     Augustin Schooling

## 2018-11-08 NOTE — Discharge Instructions (Signed)
Ice to the shoulder constantly.  Keep the incision covered and clean and dry for one week, then ok to get it wet in the shower. ° °Do exercise as instructed several times per day. ° °DO NOT reach behind your back or push up out of a chair with the operative arm. ° °Use a sling while you are up and around for comfort, may remove while seated.  Keep pillow propped behind the operative elbow. ° °Follow up with Dr Malky Rudzinski in two weeks in the office, call [phone] for appt °

## 2018-11-08 NOTE — Anesthesia Procedure Notes (Signed)
Procedure Name: Intubation Date/Time: 11/08/2018 7:42 AM Performed by: Eben Burow, CRNA Pre-anesthesia Checklist: Patient identified, Emergency Drugs available, Suction available, Patient being monitored and Timeout performed Patient Re-evaluated:Patient Re-evaluated prior to induction Oxygen Delivery Method: Circle system utilized Preoxygenation: Pre-oxygenation with 100% oxygen Induction Type: IV induction and Rapid sequence Laryngoscope Size: Mac and 4 Grade View: Grade I Tube type: Oral Tube size: 7.0 mm Number of attempts: 1 Airway Equipment and Method: Stylet Placement Confirmation: ETT inserted through vocal cords under direct vision,  positive ETCO2 and breath sounds checked- equal and bilateral Secured at: 21 cm Tube secured with: Tape Dental Injury: Teeth and Oropharynx as per pre-operative assessment

## 2018-11-08 NOTE — Transfer of Care (Signed)
Immediate Anesthesia Transfer of Care Note  Patient: Heidi Dominguez  Procedure(s) Performed: INCISION AND DRAINAGE right shoulder, placement of antibiotic beads  (Right Shoulder)  Patient Location: PACU  Anesthesia Type:General  Level of Consciousness: awake, alert  and oriented  Airway & Oxygen Therapy: Patient Spontanous Breathing and Patient connected to face mask oxygen  Post-op Assessment: Report given to RN and Post -op Vital signs reviewed and stable  Post vital signs: Reviewed and stable  Last Vitals:  Vitals Value Taken Time  BP    Temp    Pulse    Resp    SpO2      Last Pain:  Vitals:   11/08/18 0554  TempSrc: Oral         Complications: No apparent anesthesia complications

## 2018-11-09 NOTE — Progress Notes (Signed)
Dr. Veverly Fells notified to obtain a PT order for mobility-gait and transferring. See new order received.

## 2018-11-09 NOTE — Evaluation (Signed)
Physical Therapy Evaluation Patient Details Name: Heidi Dominguez MRN: [id number] DOB: 06-02-1921 Today's Date: 11/09/2018   History of Present Illness  85 yo female s/p R shoulder I&D with placement of antibiotic beads on 11/08/18. Pt with recnent admission 09/2018 for UTI. PMH includes bilateral RTC repair, OA, chronic back pain, CHF, GERD, DM, cataracts extraction, kyphoplasty, lap chole, bilateral TKR.     Clinical Impression  Pt presents with R shoulder pain, difficulty performing all mobility tasks, decreased activity tolerance. Pt to benefit from acute PT to address deficits. Pt able to transfer with max assist+2 to bed from recliner, unable to ambulate this session due to fatigue and LE weakness. PT recommending SNF placement to address mobility deficits. PT to progress mobility as tolerated, and will continue to follow acutely.      Follow Up Recommendations SNF;Supervision/Assistance - 24 hour    Equipment Recommendations  None recommended by PT    Recommendations for Other Services       Precautions / Restrictions Precautions Precautions: Fall Required Braces or Orthoses: Sling Splint/Cast: RUE sling, s/p I&D. PT not to perform PROM/AAROM/AROM during PT, leave RUE in sling.  Restrictions Weight Bearing Restrictions: No      Mobility  Bed Mobility Overal bed mobility: Needs Assistance Bed Mobility: Sit to Supine       Sit to supine: +2 for physical assistance;Max assist   General bed mobility comments: Pt up in chair upon PT arrival to room. Max assist +2 for sit to supine for LE lifting and translation into bed, trunk lowering, and positioning pt in bed with use of bed pads.  Transfers Overall transfer level: Needs assistance Equipment used: 1 person hand held assist Transfers: Stand Pivot Transfers   Stand pivot transfers: Mod assist;+2 physical assistance;+2 safety/equipment       General transfer comment: Mod assist +2 for stand pivot for power up,  steadying, and placement of bilateral LEs under base of support. Pt with tendency to leave RLE in front of her. Initially, PT attempted to stand with pt with use of RW for LUE steadying. Pt attempting to reach for RW with RUE, so discontinued this. +2 steadying for transfer to bed for steadying, guiding pt at gait belt and pelvis, and slow lowering to bed. Pt able to take very small steps to get to EOB.   Ambulation/Gait Ambulation/Gait assistance: (NT - pt only able to take pivoting steps, very fatigued from transfer)              Stairs            Wheelchair Mobility    Modified Rankin (Stroke Patients Only)       Balance Overall balance assessment: Needs assistance;History of Falls(Pt states "I have had several falls") Sitting-balance support: Single extremity supported;Feet supported Sitting balance-Leahy Scale: Fair Sitting balance - Comments: able to sit EOB without PT support   Standing balance support: During functional activity;Bilateral upper extremity supported   Standing balance comment: +2 assist for stability when standing                             Pertinent Vitals/Pain Pain Assessment: Faces Faces Pain Scale: Hurts little more Pain Location: RUE Pain Descriptors / Indicators: Sore Pain Intervention(s): Limited activity within patient's tolerance;Monitored during session;Repositioned    Home Living Family/patient expects to be discharged to:: Skilled nursing facility  Additional Comments: Pt has been at [facility] for rehab since January per report    Prior Function Level of Independence: Needs assistance   Gait / Transfers Assistance Needed: Pt reports she uses w/c mostly for mobility, and states she has not walked for months. Pt reports pedaling w/c with feet. Pt's mother called and states to PT that she has walked with PT leading up to hospitalization, and then pt states "oh yes, with therapy I have".  ADL's /  Homemaking Assistance Needed: Pt has pericare assist, most likely receives care for dressing/bathing.        Hand Dominance   Dominant Hand: Right    Extremity/Trunk Assessment   Upper Extremity Assessment Upper Extremity Assessment: Generalized weakness;RUE deficits/detail RUE Deficits / Details: immobilized in sling     Lower Extremity Assessment Lower Extremity Assessment: Generalized weakness    Cervical / Trunk Assessment Cervical / Trunk Assessment: Kyphotic  Communication   Communication: HOH  Cognition Arousal/Alertness: Awake/alert Behavior During Therapy: WFL for tasks assessed/performed Overall Cognitive Status: No family/caregiver present to determine baseline cognitive functioning                                 General Comments: It appears pt may have slight memory difficulty, as pt report of mobility history did not match that of daughter's.       General Comments General comments (skin integrity, edema, etc.): Pt with urinary incotinence upon standing from recliner. NT cleaned up urine, and changed pt socks for safe standing and hygiene.     Exercises     Assessment/Plan    PT Assessment Patient needs continued PT services  PT Problem List Decreased strength;Decreased mobility;Decreased safety awareness;Decreased activity tolerance;Decreased balance;Pain       PT Treatment Interventions DME instruction;Functional mobility training;Balance training;Patient/family education;Gait training;Therapeutic activities;Therapeutic exercise    PT Goals (Current goals can be found in the Care Plan section)  Acute Rehab PT Goals Patient Stated Goal: none stated  PT Goal Formulation: With patient Time For Goal Achievement: 11/23/18 Potential to Achieve Goals: Good    Frequency Min 2X/week   Barriers to discharge        Co-evaluation               AM-PAC PT "6 Clicks" Mobility  Outcome Measure Help needed turning from your back to  your side while in a flat bed without using bedrails?: A Lot Help needed moving from lying on your back to sitting on the side of a flat bed without using bedrails?: A Lot Help needed moving to and from a bed to a chair (including a wheelchair)?: Total Help needed standing up from a chair using your arms (e.g., wheelchair or bedside chair)?: Total Help needed to walk in hospital room?: Total Help needed climbing 3-5 steps with a railing? : Total 6 Click Score: 8    End of Session Equipment Utilized During Treatment: Gait belt Activity Tolerance: Patient limited by fatigue Patient left: in bed;with bed alarm set;with call bell/phone within reach Nurse Communication: Mobility status PT Visit Diagnosis: Other abnormalities of gait and mobility (R26.89);Muscle weakness (generalized) (M62.81);Difficulty in walking, not elsewhere classified (R26.2);Repeated falls (R29.6)    Time: 1335-1350 PT Time Calculation (min) (ACUTE ONLY): 15 min   Charges:   PT Evaluation $PT Eval Low Complexity: 1 Low          Muhamad Serano Terrial Rhodes, PT Acute Rehabilitation Services Pager [phone]  Office [phone]   Nicola Police 11/09/2018, 3:23 PM

## 2018-11-09 NOTE — Plan of Care (Signed)
  Problem: Pain Management: Goal: Pain level will decrease with appropriate interventions Outcome: Progressing   Problem: Skin Integrity: Goal: Risk for impaired skin integrity will decrease Outcome: Progressing   Problem: Nutrition: Goal: Adequate nutrition will be maintained Outcome: Progressing   Problem: Clinical Measurements: Goal: Respiratory complications will improve Outcome: Progressing

## 2018-11-09 NOTE — Progress Notes (Signed)
   Subjective: 1 Day Post-Op Procedure(s) (LRB): INCISION AND DRAINAGE right shoulder, placement of antibiotic beads  (Right)  Pt doing well this morning Mild soreness in the right shoulder today Denies any new symptoms or issues Sling in place Patient reports pain as mild.  Objective:   VITALS:   Vitals:   11/08/18 2128 11/09/18 0538  BP: 124/63 (!) 119/54  Pulse: 84 81  Resp: 16 18  Temp: 98.1 F (36.7 C) 98.7 F (37.1 C)  SpO2: 94% 100%    Right shoulder incision healing well No drainage or erythema nv intact distally No rashes or edema distally  LABS Recent Labs    11/08/18 0605  HGB 14.1  HCT 45.5  WBC 9.7  PLT 187    Recent Labs    11/08/18 0605  NA 136  K 3.9  BUN 13  CREATININE 0.63  GLUCOSE 186*     Assessment/Plan: 1 Day Post-Op Procedure(s) (LRB): INCISION AND DRAINAGE right shoulder, placement of antibiotic beads  (Right) Continue antibiotics Plan for dressing change tomorrow to aquacel and plan for d/c on Monday  Pt in agreement     Brad Brehanna Deveny PA-C, [place] is now [facility]  [facility] [address] [place], [place], [place] [address] Phone: [phone] [email or web address] Facebook  [employer]

## 2018-11-10 NOTE — Progress Notes (Signed)
Physical Therapy Treatment Patient Details Name: Heidi Dominguez MRN: [id number] DOB: 1921/01/19 Today's Date: 11/10/2018    History of Present Illness 85 yo female s/p R shoulder I&D with placement of antibiotic beads on 11/08/18. Pt with recnent admission 09/2018 for UTI. PMH includes bilateral RTC repair, OA, chronic back pain, CHF, GERD, DM, cataracts extraction, kyphoplasty, lap chole, bilateral TKR.     PT Comments    Pt assisted to EOB and then attempted standing x4.  Pt unable to achieve full upright posture however performed multiple sit to stands for strengthening and technique.  Continue to recommend SNF upon d/c.  Follow Up Recommendations  SNF;Supervision/Assistance - 24 hour     Equipment Recommendations  None recommended by PT    Recommendations for Other Services       Precautions / Restrictions Precautions Precautions: Fall Required Braces or Orthoses: Sling Splint/Cast: RUE sling, s/p I&D. PT not to perform PROM/AAROM/AROM during PT, leave RUE in sling.     Mobility  Bed Mobility Overal bed mobility: Needs Assistance Bed Mobility: Sit to Supine;Supine to Sit     Supine to sit: Min assist Sit to supine: Mod assist   General bed mobility comments: pt self assisted with pulling up with L UE, R UE remained in sling, verbal cues for technique, assist for lower body onto bed and repositioning once in bed  Transfers Overall transfer level: Needs assistance Equipment used: 1 person hand held assist Transfers: Sit to/from Stand Sit to Stand: Max assist         General transfer comment: pt attempted x4 for strengthening and technique, assist required to rise, cues for weight shifting, pt unable to achieve full upright posture however (would need +2 assist)  Ambulation/Gait                 Stairs             Wheelchair Mobility    Modified Rankin (Stroke Patients Only)       Balance                                            Cognition Arousal/Alertness: Awake/alert Behavior During Therapy: WFL for tasks assessed/performed Overall Cognitive Status: No family/caregiver present to determine baseline cognitive functioning                                 General Comments: slight memory difficulty, otherwise appropriate and follows commands      Exercises      General Comments        Pertinent Vitals/Pain Pain Assessment: Faces Faces Pain Scale: Hurts a little bit Pain Location: RUE Pain Descriptors / Indicators: Sore;Discomfort Pain Intervention(s): Monitored during session;Repositioned    Home Living                      Prior Function            PT Goals (current goals can now be found in the care plan section) Progress towards PT goals: Progressing toward goals    Frequency    Min 2X/week      PT Plan Current plan remains appropriate    Co-evaluation              AM-PAC PT "6 Clicks" Mobility   Outcome Measure  Help needed turning from your back to your side while in a flat bed without using bedrails?: A Lot Help needed moving from lying on your back to sitting on the side of a flat bed without using bedrails?: A Lot Help needed moving to and from a bed to a chair (including a wheelchair)?: Total Help needed standing up from a chair using your arms (e.g., wheelchair or bedside chair)?: Total Help needed to walk in hospital room?: Total Help needed climbing 3-5 steps with a railing? : Total 6 Click Score: 8    End of Session Equipment Utilized During Treatment: Gait belt Activity Tolerance: Patient limited by fatigue Patient left: in bed;with bed alarm set;with call bell/phone within reach   PT Visit Diagnosis: Other abnormalities of gait and mobility (R26.89);Muscle weakness (generalized) (M62.81)     Time: 5427-0623 PT Time Calculation (min) (ACUTE ONLY): 18 min  Charges:  $Therapeutic Activity: 8-22 mins                    Heidi Dominguez, PT, DPT Acute Rehabilitation Services Office: [phone] Pager: [phone]  Heidi Dominguez 11/10/2018, 9:55 AM

## 2018-11-10 NOTE — NC FL2 (Signed)
[place] LEVEL OF CARE SCREENING TOOL     IDENTIFICATION  Patient Name: Heidi Dominguez Birthdate: 08/24/1920 Sex: female Admission Date (Current Location): 11/08/2018  [facility] and [place] Number:  Herbalist and Address:  [facility],  [place] [address] [place]      Provider Number: [id number]  Attending Physician Name and Address:  [name], MD  Relative Name and Phone Number:       Current Level of Care: Hospital Recommended Level of Care: [place] Prior Approval Number:    Date Approved/Denied:   PASRR Number:    Discharge Plan: SNF    Current Diagnoses: Patient Active Problem List   Diagnosis Date Noted  . Infection of shoulder ([place]) 11/08/2018  . History of hemiarthroplasty of right shoulder 09/25/2018  . UTI (urinary tract infection) 09/12/2018  . Acute pancreatitis 08/28/2018  . Acute recurrent pancreatitis   . Abdominal pain 08/23/2018  . Elevated LFTs 07/04/2018  . CKD (chronic kidney disease) stage 3, GFR 30-59 ml/min (HCC) 07/04/2018  . Upper airway cough syndrome 03/13/2018  . Sensorineural hearing loss (SNHL), bilateral 12/04/2016  . Post-nasal drainage 03/14/2016  . Presbycusis of both ears 03/14/2016  . Bilateral hearing loss 10/06/2015  . Bilateral impacted cerumen 10/06/2015  . 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 Subjective tinnitus of both ears 10/06/2015  . Choledocholithiasis with chronic cholecystitis, nonobstructing 04/11/2015  . Pre-operative cardiovascular examination, high risk surgery   . Epigastric abdominal pain 04/08/2015  . Acute gallstone pancreatitis s/p lap chole w [facility] 04/11/2015 04/08/2015  . Diabetes mellitus type 2, controlled ([place]) 04/08/2015  . Gallstone pancreatitis 04/08/2015  . Mitral regurgitation 04/08/2015  . Chronic diastolic CHF (congestive heart failure) ([place]) 04/08/2015  . Dyspnea 03/31/2015  . Ischemic colitis ([place])  08/09/2012  . Colitis 08/07/2012  . Rectal bleeding 08/07/2012  . Weakness 09/24/2011  . Bradycardia 09/24/2011  . Acute lower UTI 09/24/2011  . Hematochezia 09/24/2011  . Essential hypertension 09/24/2011  . Dyslipidemia 09/24/2011    Orientation RESPIRATION BLADDER Height & Weight     Self, Time, Situation, Place  Normal Incontinent Weight: 168 lb (76.2 kg) Height:  5\' 4"  (162.6 cm)  BEHAVIORAL SYMPTOMS/MOOD NEUROLOGICAL BOWEL NUTRITION STATUS      Continent Diet(see DC summary)  AMBULATORY STATUS COMMUNICATION OF NEEDS Skin   Extensive Assist Verbally Surgical wounds(closed right shoulder, abdominal pads and tape)                       Personal Care Assistance Level of Assistance  Bathing, Feeding, Dressing Bathing Assistance: Maximum assistance Feeding assistance: Limited assistance Dressing Assistance: Maximum assistance     Functional Limitations Info  Sight, Hearing, Speech Sight Info: Adequate Hearing Info: Adequate Speech Info: Adequate    SPECIAL CARE FACTORS FREQUENCY  PT (By licensed PT), OT (By licensed OT)     PT Frequency: 5x/wk OT Frequency: 5x/wk            Contractures Contractures Info: Not present    Additional Factors Info  Code Status, Allergies Code Status Info: Full Allergies Info: Catapres Clonidine Hcl, Codeine, Covera-hs Verapamil Hcl, Hydrocodone, Other, Sulfa Antibiotics, Sulfamethoxazole, Verapamil, Sulfasalazine           Current Medications (11/10/2018):  This is the current hospital active medication list Current Facility-Administered Medications  Medication Dose Route Frequency Provider Last Rate Last Dose  . 0.9 %  sodium chloride infusion   Intravenous Continuous  [name], MD      . acetaminophen (TYLENOL) tablet 650 mg  650 mg Oral TID PRN [name], MD   650 mg at 11/08/18 1047  . albuterol (PROVENTIL) (2.5 MG/3ML) 0.083% nebulizer solution 3 mL  3 mL Inhalation Q6H PRN [name], MD      .  amLODipine (NORVASC) tablet 5 mg  5 mg Oral Daily [name], MD   5 mg at 11/09/18 0948  . aspirin EC tablet 81 mg  81 mg Oral Daily [name], MD   81 mg at 11/09/18 0949  . cholecalciferol (VITAMIN D3) tablet 5,000 Units  5,000 Units Oral Daily [name], MD   5,000 Units at 11/09/18 [phone]  . ciprofloxacin (CIPRO) tablet 500 mg  500 mg Oral BID [name], MD   500 mg at 11/10/18 0834  . docusate sodium (COLACE) capsule 100 mg  100 mg Oral BID [name], MD   100 mg at 11/09/18 2217  . famotidine (PEPCID) tablet 20 mg  20 mg Oral Donavan Burnet, MD   20 mg at 11/09/18 2217  . ferrous sulfate 300 (60 Fe) MG/5ML syrup 300 mg  300 mg Oral Q breakfast [name], MD   300 mg at 11/10/18 0834  . folic acid (FOLVITE) tablet 0.5 mg  500 mcg Oral Daily [name], MD   0.5 mg at 11/09/18 0948  . hydrochlorothiazide 10 mg/mL oral suspension 6.25 mg  6.25 mg Oral Daily [name], MD   6.25 mg at 11/09/18 1114  . lactated ringers infusion   Intravenous Continuous Belinda Block, MD 50 mL/hr at 11/09/18 1000    . lidocaine (LIDODERM) 5 % 1 patch  1 patch Transdermal Q24H [name], MD      . linagliptin (TRADJENTA) tablet 5 mg  5 mg Oral Daily [name], MD   5 mg at 11/09/18 0946  . loratadine (CLARITIN) tablet 10 mg  10 mg Oral Q2000 [name], MD   10 mg at 11/09/18 2048  . magnesium oxide (MAG-OX) tablet 400 mg  400 mg Oral Daily [name], MD   400 mg at 11/09/18 0949  . menthol-cetylpyridinium (CEPACOL) lozenge 3 mg  1 lozenge Oral PRN [name], MD       Or  . phenol [facility]) mouth spray 1 spray  1 spray Mouth/Throat PRN [name], MD      . metoCLOPramide (REGLAN) tablet 5-10 mg  5-10 mg Oral Q8H PRN [name], MD       Or  . metoCLOPramide (REGLAN) injection 5-10 mg  5-10 mg Intravenous Q8H PRN [name], MD      . mineral oil-hydrophilic petrolatum (AQUAPHOR) ointment   Topical Daily [name], MD   1 application at  57/01/77 [phone]  . ondansetron (ZOFRAN) tablet 4 mg  4 mg Oral Q6H PRN [name], MD       Or  . ondansetron [facility]) injection 4 mg  4 mg Intravenous Q6H PRN [name], MD      . pantoprazole (PROTONIX) EC tablet 40 mg  40 mg Oral Daily [name], MD   40 mg at 11/09/18 0946  . polyethylene glycol (MIRALAX / GLYCOLAX) packet 17 g  17 g Oral Daily PRN [name], MD      . polyvinyl alcohol (LIQUIFILM TEARS) 1.4 % ophthalmic solution 2 drop  2 drop Both Eyes Q4H PRN [name], MD   2 drop at 11/09/18 [phone]  . senna-docusate (Senokot-S) tablet 2  tablet  2 tablet Oral BID PRN [name], MD      . traMADol Veatrice Bourbon) tablet 50 mg  50 mg Oral Q6H PRN [name], MD   50 mg at 11/09/18 2217  . vitamin B-12 (CYANOCOBALAMIN) tablet 1,000 mcg  1,000 mcg Oral Daily [name], MD   1,000 mcg at 11/09/18 3149     Discharge Medications: Please see discharge summary for a list of discharge medications.  Relevant Imaging Results:  Relevant Lab Results:   Additional Information SS#: [id number]  Geralynn Ochs, LCSW

## 2018-11-10 NOTE — Progress Notes (Signed)
   Subjective: 2 Days Post-Op Procedure(s) (LRB): INCISION AND DRAINAGE right shoulder, placement of antibiotic beads  (Right)  Pt doing well this morning Some pain overnight, is improved this am. Denies any new symptoms or issues Sling in place Patient reports pain as mild.  Objective:   VITALS:   Vitals:   11/09/18 2222 11/10/18 0659  BP: (!) 147/68 (!) 162/69  Pulse: 86 85  Resp: 16 16  Temp: 98.7 F (37.1 C) 98.2 F (36.8 C)  SpO2: 93% 100%    Right shoulder incision healing well Dressing changed to Aquacel No drainage or erythema nv intact distally No rashes or edema distally  LABS Recent Labs    11/08/18 0605  HGB 14.1  HCT 45.5  WBC 9.7  PLT 187    Recent Labs    11/08/18 0605  NA 136  K 3.9  BUN 13  CREATININE 0.63  GLUCOSE 186*     Assessment/Plan: 2 Days Post-Op Procedure(s) (LRB): INCISION AND DRAINAGE right shoulder, placement of antibiotic beads  (Right) Continue antibiotics Aquacel to remain in place  plan for d/c on Monday  Pt in agreement     Victorino December, MD [place] is now [facility]  [place] [address], [place], [place] [address] Phone: [phone] [email or web address] Facebook  [employer]

## 2018-11-11 ENCOUNTER — Other Ambulatory Visit: Payer: Self-pay | Admitting: *Deleted

## 2018-11-11 ENCOUNTER — Encounter (HOSPITAL_COMMUNITY): Payer: Self-pay | Admitting: Orthopedic Surgery

## 2018-11-11 DIAGNOSIS — R410 Disorientation, unspecified: Secondary | ICD-10-CM | POA: Diagnosis not present

## 2018-11-11 DIAGNOSIS — R279 Unspecified lack of coordination: Secondary | ICD-10-CM | POA: Diagnosis not present

## 2018-11-11 DIAGNOSIS — Z4789 Encounter for other orthopedic aftercare: Secondary | ICD-10-CM | POA: Diagnosis not present

## 2018-11-11 DIAGNOSIS — K5900 Constipation, unspecified: Secondary | ICD-10-CM | POA: Diagnosis not present

## 2018-11-11 DIAGNOSIS — N182 Chronic kidney disease, stage 2 (mild): Secondary | ICD-10-CM | POA: Diagnosis not present

## 2018-11-11 DIAGNOSIS — I5032 Chronic diastolic (congestive) heart failure: Secondary | ICD-10-CM | POA: Diagnosis not present

## 2018-11-11 DIAGNOSIS — I130 Hypertensive heart and chronic kidney disease with heart failure and stage 1 through stage 4 chronic kidney disease, or unspecified chronic kidney disease: Secondary | ICD-10-CM | POA: Diagnosis not present

## 2018-11-11 DIAGNOSIS — R062 Wheezing: Secondary | ICD-10-CM | POA: Diagnosis not present

## 2018-11-11 DIAGNOSIS — E8809 Other disorders of plasma-protein metabolism, not elsewhere classified: Secondary | ICD-10-CM | POA: Diagnosis not present

## 2018-11-11 DIAGNOSIS — L089 Local infection of the skin and subcutaneous tissue, unspecified: Secondary | ICD-10-CM | POA: Diagnosis not present

## 2018-11-11 DIAGNOSIS — R52 Pain, unspecified: Secondary | ICD-10-CM | POA: Diagnosis not present

## 2018-11-11 DIAGNOSIS — N3281 Overactive bladder: Secondary | ICD-10-CM | POA: Diagnosis not present

## 2018-11-11 DIAGNOSIS — M6281 Muscle weakness (generalized): Secondary | ICD-10-CM | POA: Diagnosis not present

## 2018-11-11 DIAGNOSIS — Z7401 Bed confinement status: Secondary | ICD-10-CM | POA: Diagnosis not present

## 2018-11-11 DIAGNOSIS — E119 Type 2 diabetes mellitus without complications: Secondary | ICD-10-CM | POA: Diagnosis not present

## 2018-11-11 DIAGNOSIS — D529 Folate deficiency anemia, unspecified: Secondary | ICD-10-CM | POA: Diagnosis not present

## 2018-11-11 DIAGNOSIS — K219 Gastro-esophageal reflux disease without esophagitis: Secondary | ICD-10-CM | POA: Diagnosis not present

## 2018-11-11 DIAGNOSIS — D519 Vitamin B12 deficiency anemia, unspecified: Secondary | ICD-10-CM | POA: Diagnosis not present

## 2018-11-11 DIAGNOSIS — R4182 Altered mental status, unspecified: Secondary | ICD-10-CM | POA: Diagnosis not present

## 2018-11-11 DIAGNOSIS — M25511 Pain in right shoulder: Secondary | ICD-10-CM | POA: Diagnosis not present

## 2018-11-11 DIAGNOSIS — E1122 Type 2 diabetes mellitus with diabetic chronic kidney disease: Secondary | ICD-10-CM | POA: Diagnosis not present

## 2018-11-11 DIAGNOSIS — M009 Pyogenic arthritis, unspecified: Secondary | ICD-10-CM | POA: Diagnosis not present

## 2018-11-11 DIAGNOSIS — M2550 Pain in unspecified joint: Secondary | ICD-10-CM | POA: Diagnosis not present

## 2018-11-11 DIAGNOSIS — R5381 Other malaise: Secondary | ICD-10-CM | POA: Diagnosis not present

## 2018-11-11 DIAGNOSIS — R2689 Other abnormalities of gait and mobility: Secondary | ICD-10-CM | POA: Diagnosis not present

## 2018-11-11 DIAGNOSIS — K851 Biliary acute pancreatitis without necrosis or infection: Secondary | ICD-10-CM | POA: Diagnosis not present

## 2018-11-11 DIAGNOSIS — E559 Vitamin D deficiency, unspecified: Secondary | ICD-10-CM | POA: Diagnosis not present

## 2018-11-11 NOTE — Care Management Important Message (Signed)
Important Message  Patient Details  Name: Heidi Dominguez MRN: [id number] Date of Birth: 05-09-1921   Medicare Important Message Given:  Yes    Kerin Salen 11/11/2018, 9:54 AMImportant Message  Patient Details  Name: Heidi Dominguez MRN: [id number] Date of Birth: 03/17/1921   Medicare Important Message Given:  Yes    Kerin Salen 11/11/2018, 9:54 AM

## 2018-11-11 NOTE — Patient Outreach (Signed)
[facility]) Care Management  11/11/2018  Heidi Dominguez 1920/10/25 [id number]   Referral received 1/31 from post acute care coordinator for transition of care upon discharge back home from SNF.  Member has remained in SNF since then.  Noted that she admitted to hospital on 3/27, will discharge back to SNF today.  Referral placed to CSW for follow up and discharge planning.  Request made to place new referral pending disposition from SNF.  Valente David, [place], MSN [place] [phone]

## 2018-11-11 NOTE — Consult Note (Signed)
   [facility] CM Inpatient Consult   11/11/2018  Heidi Dominguez 10/12/1920 [id number]   Patient screened for potential [facility] Care Management services due to unplanned readmission risk score of 22%, high, and multiple hospitalizations.   Per chart review, current disposition plan is for SNF, [facility] No [facility] CM needs at this time.  [name], MSN, [place] [facility] Liaison Nurse Mobile Phone [phone]  Toll free office [phone]

## 2018-11-11 NOTE — Discharge Summary (Signed)
Orthopedic Discharge Summary        Physician Discharge Summary  Patient ID: DAYLYNN STUMPP MRN: [id number] DOB/AGE: 1921/07/14 85 y.o.  Admit date: 11/08/2018 Discharge date: 11/11/2018   Procedures:  Procedure(s) (LRB): INCISION AND DRAINAGE right shoulder, placement of antibiotic beads  (Right)  Attending Physician:  Dr. Esmond Plants  Admission Diagnoses:   Right shoulder infection Discharge Diagnoses:  Right shoulder infection   Past Medical History:  Diagnosis Date  . Acute pancreatitis    hx of   . Arthritis    "qwhere" (07/04/2018)  . Chronic back pain    "all over" (07/04/2018)  . Chronic diastolic CHF (congestive heart failure) ([place])   . Chronic kidney disease    stage  3 chronic kidney disease   . Complication of anesthesia    "I have a hard time waking up"  . Degenerative joint disease of shoulder region   . Dry eye syndrome   . Dyspnea   . Family history of adverse reaction to anesthesia    "daughter has the shakes when she wakes up"  . GERD (gastroesophageal reflux disease)   . Heart murmur   . High cholesterol   . History of hiatal hernia   . HOH (hard of hearing)   . Hypertension   . Impacted cerumen of both ears    hx of   . Muscle weakness (generalized)   . Osteoarthritis   . Overactive bladder   . Pancreatitis    hx of   . Phlebitis    "BLE"  . Rotator cuff tear    right   . Tear of right supraspinatus tendon   . Thrombocytopenia (HCC)    hx of   . Type II diabetes mellitus ([place])   . Urinary tract infection    hx of   . Xerosis cutis    hx of     PCP: Lajean Manes, MD   Discharged Condition: good  Hospital Course:  Patient underwent the above stated procedure on 11/08/2018. Patient tolerated the procedure well and brought to the recovery room in good condition and subsequently to the floor. Patient had an uncomplicated hospital course and was stable for discharge.   Disposition: Discharge disposition: 03-Skilled Nursing  Facility      with follow up in 2 weeks    Contact information for follow-up providers    [name], MD. Call in 2 weeks.   Specialty:  Orthopedic Surgery Why:  call for appt Contact information: [address] [place] [address] [phone]            Contact information for after-discharge care    Destination    [facility] .   Service:  Skilled Nursing Contact information: [address] [place] [phone]                  Discharge Instructions    Call MD / Call 911   Complete by:  As directed    If you experience chest pain or shortness of breath, CALL 911 and be transported to the hospital emergency room.  If you develope a fever above 101 F, pus (white drainage) or increased drainage or redness at the wound, or calf pain, call your surgeon's office.   Constipation Prevention   Complete by:  As directed    Drink plenty of fluids.  Prune juice may be helpful.  You may use a stool softener, such as Colace (over the  counter) 100 mg twice a day.  Use MiraLax (over the counter) for constipation as needed.   Diet - low sodium heart healthy   Complete by:  As directed    Increase activity slowly as tolerated   Complete by:  As directed       Allergies as of 11/11/2018      Reactions   Catapres [clonidine Hcl] Other (See Comments)   Unknown. Unknown.   Codeine Other (See Comments)   "Makes me out of my head, loopy"   Covera-hs [verapamil Hcl] Other (See Comments)   Unknown.   Hydrocodone Other (See Comments)   "makes me go out of my head, loopy"   Other Other (See Comments)   Any type of narcotics Feels "loopy" Any type of narcotics Feels "loopy"   Sulfa Antibiotics Itching   Sulfamethoxazole Diarrhea   Verapamil Diarrhea, Other (See Comments)   Sulfasalazine Itching      Medication List    TAKE these medications   1st Medx-Patch/ Lidocaine 4-0.025-5-20 % Ptch  Generic drug:  Lido-Capsaicin-Men-Methyl Sal Apply 1 patch topically daily.   acetaminophen 325 MG tablet Commonly known as:  TYLENOL Take 650 mg by mouth 3 (three) times daily as needed (pain.). What changed:  Another medication with the same name was removed. Continue taking this medication, and follow the directions you see here.   albuterol 108 (90 Base) MCG/ACT inhaler Commonly known as:  PROVENTIL HFA;VENTOLIN HFA Inhale into the lungs every 6 (six) hours as needed for wheezing or shortness of breath.   amLODipine 5 MG tablet Commonly known as:  NORVASC Take 1 tablet (5 mg total) by mouth daily.   AQUAPHOR EX Apply 1 application topically daily. Apply bilaterally to feet   aspirin EC 81 MG tablet Take 81 mg by mouth daily.   ciprofloxacin 500 MG tablet Commonly known as:  CIPRO Take 1 tablet (500 mg total) by mouth 2 (two) times daily.   famotidine 10 MG tablet Commonly known as:  PEPCID Take 20 mg by mouth at bedtime.   folic acid 338 MCG tablet Commonly known as:  FOLVITE Take 400 mcg by mouth daily.   hydrochlorothiazide 12.5 MG tablet Commonly known as:  HYDRODIURIL Take 6.25 mg by mouth daily. What changed:  Another medication with the same name was removed. Continue taking this medication, and follow the directions you see here.   Iron 240 (27 Fe) MG Tabs Take 240 mg by mouth daily.   Januvia 50 MG tablet Generic drug:  sitaGLIPtin Take 50 mg by mouth daily.   loratadine 10 MG tablet Commonly known as:  CLARITIN Take 10 mg by mouth daily at 8 pm.   magnesium oxide 400 (241.3 Mg) MG tablet Commonly known as:  MAG-OX Take 1 tablet (400 mg total) by mouth daily.   pantoprazole 40 MG tablet Commonly known as:  PROTONIX Take 40 mg by mouth daily.   polyethylene glycol packet Commonly known as:  MIRALAX / GLYCOLAX Take 17 g by mouth daily as needed for mild constipation.   senna-docusate 8.6-50 MG tablet Commonly known as:  Senokot-S Take 2  tablets by mouth 2 (two) times daily. What changed:    when to take this  reasons to take this   Systane 0.4-0.3 % Soln Generic drug:  Polyethyl Glycol-Propyl Glycol Place 2 drops into both eyes every 4 (four) hours as needed (dry eyes).   traMADol 50 MG tablet Commonly known as:  Ultram Take 1 tablet (50 mg total) by  mouth every 6 (six) hours as needed for moderate pain or severe pain.   vitamin B-12 1000 MCG tablet Commonly known as:  CYANOCOBALAMIN Take 1,000 mcg by mouth daily.   Vitamin D-3 125 MCG (5000 UT) Tabs Take 5,000 Units by mouth daily.         Signed: Ventura Bruns 11/11/2018, 8:47 AM  [facility] is now [employer] [facility] [address] [place], [place], [place] [address] Phone: [place]

## 2018-11-11 NOTE — Progress Notes (Signed)
Call made to [facility] for report. No answer. Heidi Dominguez, Therapist, sports.

## 2018-11-11 NOTE — TOC Transition Note (Signed)
Transition of Care [facility]) - CM/SW Discharge Note   Patient Details  Name: Heidi Dominguez MRN: [id number] Date of Birth: [birth year]  Transition of Care [facility]) CM/SW Contact:  Leeroy Cha, RN Phone Number: 11/11/2018, 9:55 AM   Clinical Narrative:    Patient agrees to [employer] / tct-Daughter -made aware of transfer/dc summary and medical necessity forms faxed to [facility]. ptar called for transport.     Barriers to Discharge: Continued Medical Work up   Patient Goals and CMS Choice Patient states their goals for this hospitalization and ongoing recovery are:: get back to rehab CMS Medicare.gov Compare Post Acute Care list provided to:: Patient Choice offered to / list presented to : Patient  Discharge Placement                       Discharge Plan and Services                          Social Determinants of Health (SDOH) Interventions     Readmission Risk Interventions No flowsheet data found.

## 2018-11-11 NOTE — Progress Notes (Signed)
   Subjective: 3 Days Post-Op Procedure(s) (LRB): INCISION AND DRAINAGE right shoulder, placement of antibiotic beads  (Right)  Pt doing well C/o mild soreness in the shoulder but otherwise doing well Denies any new symptoms or issues Patient reports pain as mild.  Objective:   VITALS:   Vitals:   11/10/18 2126 11/11/18 0635  BP: 128/80 (!) 150/74  Pulse: 94 82  Resp: 16 16  Temp: 99.8 F (37.7 C) 98.7 F (37.1 C)  SpO2: 97% 94%    Right shoulder incision healing well nv intact distally No rashes or edema distally   LABS No results for input(s): HGB, HCT, WBC, PLT in the last 72 hours.  No results for input(s): NA, K, BUN, CREATININE, GLUCOSE in the last 72 hours.   Assessment/Plan: 3 Days Post-Op Procedure(s) (LRB): INCISION AND DRAINAGE right shoulder, placement of antibiotic beads  (Right) D/c to rehab facility today F/u in 2 weeks for suture removal May discontinue sling and begin moving the arm as needed  Spoke with the daughter over the phone to discuss plan    [facility] [place] [place] is now [employer] [facility] [address] [place], [place], [place] [address] Phone: [phone] [email or web address] Facebook  [employer]

## 2018-11-11 NOTE — Progress Notes (Signed)
Another call placed to [facility] for report. No answer. Donne Hazel, RN

## 2018-11-11 NOTE — Progress Notes (Signed)
Tasha from [employer] called. Report given including that patient's wheelchair is here. Questions answered.  Aniceto Boss will send someone to pick it up. Donne Hazel, RN

## 2018-11-13 LAB — AEROBIC/ANAEROBIC CULTURE W GRAM STAIN (SURGICAL/DEEP WOUND)
Culture: NO GROWTH
Culture: NO GROWTH

## 2018-11-14 ENCOUNTER — Other Ambulatory Visit: Payer: Self-pay | Admitting: *Deleted

## 2018-11-14 DIAGNOSIS — K5900 Constipation, unspecified: Secondary | ICD-10-CM | POA: Diagnosis not present

## 2018-11-14 DIAGNOSIS — E119 Type 2 diabetes mellitus without complications: Secondary | ICD-10-CM | POA: Diagnosis not present

## 2018-11-14 DIAGNOSIS — M009 Pyogenic arthritis, unspecified: Secondary | ICD-10-CM | POA: Diagnosis not present

## 2018-11-14 DIAGNOSIS — I5032 Chronic diastolic (congestive) heart failure: Secondary | ICD-10-CM | POA: Diagnosis not present

## 2018-11-14 NOTE — Patient Outreach (Signed)
[facility]) Care Management  11/14/2018  Heidi Dominguez January 30, 1921 [id number]   CSW spoke with pt by phone today briefly who reports she is in the exercise room at [facility]. CSW offered to call back tomorrow.   Eduard Clos, MSW, [place] Worker  [place] [phone]

## 2018-11-15 ENCOUNTER — Ambulatory Visit: Payer: Self-pay | Admitting: *Deleted

## 2018-11-19 ENCOUNTER — Other Ambulatory Visit: Payer: Self-pay | Admitting: *Deleted

## 2018-11-19 DIAGNOSIS — R52 Pain, unspecified: Secondary | ICD-10-CM | POA: Diagnosis not present

## 2018-11-19 DIAGNOSIS — E1122 Type 2 diabetes mellitus with diabetic chronic kidney disease: Secondary | ICD-10-CM | POA: Diagnosis not present

## 2018-11-19 DIAGNOSIS — M009 Pyogenic arthritis, unspecified: Secondary | ICD-10-CM | POA: Diagnosis not present

## 2018-11-19 DIAGNOSIS — N182 Chronic kidney disease, stage 2 (mild): Secondary | ICD-10-CM | POA: Diagnosis not present

## 2018-11-19 NOTE — Patient Outreach (Signed)
[facility]) Care Management  11/19/2018  CARLI LEFEVERS 07/06/1921 [id number]   CSW made contact with pt who confirmed her identity as well as that she remains in SNF rehab. Pt requests that CSW call her daughter, [name], to discuss and answer questions for assessment.  CSW contacted pt's daughter, [name], who also confirmed pt's identity.CSW introduced self and reason for call. Per daughter, they would like to review info related to our program (CSW did brief her on our services and support) and contact us if they desire to enroll. CSW advised plans to sign off and close referral; and will make PCP and [facility] team aware of the above.   Eduard Clos, MSW, [place] Worker  [place] [phone]

## 2018-11-20 ENCOUNTER — Other Ambulatory Visit: Payer: Self-pay | Admitting: *Deleted

## 2018-11-20 DIAGNOSIS — M009 Pyogenic arthritis, unspecified: Secondary | ICD-10-CM | POA: Diagnosis not present

## 2018-11-20 NOTE — Patient Outreach (Signed)
[facility]) Care Management  11/20/2018  AMEIRA ALESSANDRINI 1921-05-05 [id number]   [facility] Post Acute Care Coordinator follow up. [place] team was assigned back in January 2020. Member has been in SNF and then hospital since initial referral was made.  Member remains at [facility] for rehab. Per chart review, noted [facility] LCSW outreached to member on 11/19/2018. Please see patient outreach details from 11/19/18 for these details.   Confirmed with [facility] LCSW today that she spoke with patient's daughter, Areli, per patient's request. Kassandra declined [place] Management services per [facility] LCSW. Alahni (dtr) wants to look over [facility] information and will contact [facility] if services are warranted per [facility] LCSW.  At this time, writer will sign off due to daughter's refusal of [place] Management services at this time.  Will make [facility] UM team aware.   Marthenia Rolling, MSN-Ed, RN,BSN [place] Acute Care Coordinator [phone]

## 2018-11-26 DIAGNOSIS — R52 Pain, unspecified: Secondary | ICD-10-CM | POA: Diagnosis not present

## 2018-11-26 DIAGNOSIS — M009 Pyogenic arthritis, unspecified: Secondary | ICD-10-CM | POA: Diagnosis not present

## 2018-11-26 DIAGNOSIS — E1122 Type 2 diabetes mellitus with diabetic chronic kidney disease: Secondary | ICD-10-CM | POA: Diagnosis not present

## 2018-11-28 ENCOUNTER — Other Ambulatory Visit: Payer: Self-pay | Admitting: *Deleted

## 2018-11-28 NOTE — Patient Outreach (Signed)
[place] [facility]) Care Management  11/28/2018  KANIA REGNIER 1921/04/08 [id number]   Collaboration with [place] after telephonic IDT meeting between [facility] UM and facility staff.   Mrs. Craine remains at [facility] receiving rehab. [facility] UM indicates member could really benefit from [place] Management if she should return home. Discussed that daughter declined [facility] follow up when [place] LCSW outreached to her.   Writer made aware that member will have exhausted 100 SNF days the beginning of May 2020. Unsure of disposition plans at this time. Apparently daughter is trying to arrange caregivers for home.   Nonetheless, [facility] UM requested that writer place member back on list to follow for potential [facility] Care Management needs if member returns home.   Plan to outreach to daughter closer to SNF discharge to discuss [place] Management follow up.  Plan to follow for disposition plans and progression.  Plan for continued collaboration with [facility] UM.   Marthenia Rolling, MSN-Ed, RN,BSN [place] Acute Care Coordinator [phone]

## 2018-11-29 DIAGNOSIS — M009 Pyogenic arthritis, unspecified: Secondary | ICD-10-CM | POA: Diagnosis not present

## 2018-11-29 DIAGNOSIS — E119 Type 2 diabetes mellitus without complications: Secondary | ICD-10-CM | POA: Diagnosis not present

## 2018-11-29 DIAGNOSIS — I130 Hypertensive heart and chronic kidney disease with heart failure and stage 1 through stage 4 chronic kidney disease, or unspecified chronic kidney disease: Secondary | ICD-10-CM | POA: Diagnosis not present

## 2018-12-03 DIAGNOSIS — M009 Pyogenic arthritis, unspecified: Secondary | ICD-10-CM | POA: Diagnosis not present

## 2018-12-03 DIAGNOSIS — I130 Hypertensive heart and chronic kidney disease with heart failure and stage 1 through stage 4 chronic kidney disease, or unspecified chronic kidney disease: Secondary | ICD-10-CM | POA: Diagnosis not present

## 2018-12-03 DIAGNOSIS — I5032 Chronic diastolic (congestive) heart failure: Secondary | ICD-10-CM | POA: Diagnosis not present

## 2018-12-05 ENCOUNTER — Other Ambulatory Visit: Payer: Self-pay | Admitting: *Deleted

## 2018-12-05 NOTE — Patient Outreach (Signed)
[facility] [facility]) Care Management  12/05/2018  Heidi Dominguez 09-03-1920 [id number]   Collaboration with [place] after her IDT meeting with [name] staff. Member remains at [facility] receiving rehab therapy.  Per [facility] UM RN, facility has a planned virtual home visit with member's daughter this upcoming Sunday.   Discharge plan remains home with daughter.  Daughter, previously declined services with [facility] LCSW.   However, [facility] UM RN requested that writer attempt to re-engage for [place] Management services. Member will benefit from additional support post SNF discharge. Questionable on whether member will exhaust her SNF days during this stay.   Will continue to follow and plan to call daughter next week.  Will continue to collaborate with [facility] UM RN regarding member.     Marthenia Rolling, MSN-Ed, RN,BSN [place] Acute Care Coordinator [phone]

## 2018-12-06 DIAGNOSIS — M009 Pyogenic arthritis, unspecified: Secondary | ICD-10-CM | POA: Diagnosis not present

## 2018-12-06 DIAGNOSIS — K5900 Constipation, unspecified: Secondary | ICD-10-CM | POA: Diagnosis not present

## 2018-12-06 DIAGNOSIS — R062 Wheezing: Secondary | ICD-10-CM | POA: Diagnosis not present

## 2018-12-06 DIAGNOSIS — R52 Pain, unspecified: Secondary | ICD-10-CM | POA: Diagnosis not present

## 2018-12-09 ENCOUNTER — Other Ambulatory Visit: Payer: Self-pay | Admitting: *Deleted

## 2018-12-09 NOTE — Patient Outreach (Signed)
[facility]) Care Management  12/09/2018  Heidi Dominguez [birth year] [id number]   Per request of [facility] UM RN, writer outreached to [employer] daughter, Heidi Dominguez [phone]), closer to SNF discharge. Member is currently at [facility] receiving rehab.  Of note, Heidi Dominguez (daughter) recently declined [place] Management program with [facility] LCSW. However, it was requested by [facility] UM that writer attempt to engage member again due to high risk for readmission.  Spoke with Heidi Dominguez (daughter) since member has previously redirected [place] Management staff to contact daughter on her behalf.  Attempted to explain [facility] Care Management services in detail and how it these services could benefit member post SNF discharge.   Heidi Dominguez (daughter) states "we will have home health care and will have a lot going on with them. I do not want anyone calling my mother because she would be confused with all the people contacting her. I should always be the one to be called. What I suggest is that you call us back after 90 days of discharge to see if we need you or not. I have a lot going on and I am trying to establish home caregivers and maintaining my household and my mother's house. Unless you are going to pay for private duty, I am not sure what you can do for [place] right now. "  Heidi Dominguez once again declined [place] Management services on her mother's behalf.   She states they are going to pay out of pocket for 24/7 caregivers and she is in the process of looking into other resources such as VA benefits. Tried to explain to [name] that [facility] LCSW could help navigate with that process. Heidi Dominguez still declined. Stated "unless you are going to pay for it, we don't need your services."  Provided [facility] Care Management telephone number once again for her daughter to call should she want [place] Management services.  Will make [facility] UM RN aware [facility] Care Management services are declined for the  second time by daughter.  Plan to sign off.   Marthenia Rolling, MSN-Ed, RN,BSN [place] Acute Care Coordinator [phone]

## 2018-12-10 DIAGNOSIS — K219 Gastro-esophageal reflux disease without esophagitis: Secondary | ICD-10-CM | POA: Diagnosis not present

## 2018-12-10 DIAGNOSIS — N182 Chronic kidney disease, stage 2 (mild): Secondary | ICD-10-CM | POA: Diagnosis not present

## 2018-12-10 DIAGNOSIS — I130 Hypertensive heart and chronic kidney disease with heart failure and stage 1 through stage 4 chronic kidney disease, or unspecified chronic kidney disease: Secondary | ICD-10-CM | POA: Diagnosis not present

## 2018-12-10 DIAGNOSIS — E8809 Other disorders of plasma-protein metabolism, not elsewhere classified: Secondary | ICD-10-CM | POA: Diagnosis not present

## 2018-12-18 DIAGNOSIS — K851 Biliary acute pancreatitis without necrosis or infection: Secondary | ICD-10-CM | POA: Diagnosis not present

## 2018-12-18 DIAGNOSIS — I130 Hypertensive heart and chronic kidney disease with heart failure and stage 1 through stage 4 chronic kidney disease, or unspecified chronic kidney disease: Secondary | ICD-10-CM | POA: Diagnosis not present

## 2018-12-18 DIAGNOSIS — I5032 Chronic diastolic (congestive) heart failure: Secondary | ICD-10-CM | POA: Diagnosis not present

## 2018-12-18 DIAGNOSIS — M009 Pyogenic arthritis, unspecified: Secondary | ICD-10-CM | POA: Diagnosis not present

## 2018-12-19 ENCOUNTER — Ambulatory Visit: Payer: Medicare Other | Admitting: Podiatry

## 2018-12-20 DIAGNOSIS — E1122 Type 2 diabetes mellitus with diabetic chronic kidney disease: Secondary | ICD-10-CM | POA: Diagnosis not present

## 2018-12-20 DIAGNOSIS — I130 Hypertensive heart and chronic kidney disease with heart failure and stage 1 through stage 4 chronic kidney disease, or unspecified chronic kidney disease: Secondary | ICD-10-CM | POA: Diagnosis not present

## 2018-12-20 DIAGNOSIS — Z96611 Presence of right artificial shoulder joint: Secondary | ICD-10-CM | POA: Diagnosis not present

## 2018-12-20 DIAGNOSIS — Z87440 Personal history of urinary (tract) infections: Secondary | ICD-10-CM | POA: Diagnosis not present

## 2018-12-20 DIAGNOSIS — M75101 Unspecified rotator cuff tear or rupture of right shoulder, not specified as traumatic: Secondary | ICD-10-CM | POA: Diagnosis not present

## 2018-12-20 DIAGNOSIS — Z7984 Long term (current) use of oral hypoglycemic drugs: Secondary | ICD-10-CM | POA: Diagnosis not present

## 2018-12-20 DIAGNOSIS — N182 Chronic kidney disease, stage 2 (mild): Secondary | ICD-10-CM | POA: Diagnosis not present

## 2018-12-20 DIAGNOSIS — Z7982 Long term (current) use of aspirin: Secondary | ICD-10-CM | POA: Diagnosis not present

## 2018-12-20 DIAGNOSIS — I5032 Chronic diastolic (congestive) heart failure: Secondary | ICD-10-CM | POA: Diagnosis not present

## 2018-12-21 DIAGNOSIS — N182 Chronic kidney disease, stage 2 (mild): Secondary | ICD-10-CM | POA: Diagnosis not present

## 2018-12-21 DIAGNOSIS — E1122 Type 2 diabetes mellitus with diabetic chronic kidney disease: Secondary | ICD-10-CM | POA: Diagnosis not present

## 2018-12-21 DIAGNOSIS — I5032 Chronic diastolic (congestive) heart failure: Secondary | ICD-10-CM | POA: Diagnosis not present

## 2018-12-21 DIAGNOSIS — M75101 Unspecified rotator cuff tear or rupture of right shoulder, not specified as traumatic: Secondary | ICD-10-CM | POA: Diagnosis not present

## 2018-12-21 DIAGNOSIS — I130 Hypertensive heart and chronic kidney disease with heart failure and stage 1 through stage 4 chronic kidney disease, or unspecified chronic kidney disease: Secondary | ICD-10-CM | POA: Diagnosis not present

## 2018-12-21 DIAGNOSIS — Z87440 Personal history of urinary (tract) infections: Secondary | ICD-10-CM | POA: Diagnosis not present

## 2018-12-23 DIAGNOSIS — H43813 Vitreous degeneration, bilateral: Secondary | ICD-10-CM | POA: Diagnosis not present

## 2018-12-23 DIAGNOSIS — H04123 Dry eye syndrome of bilateral lacrimal glands: Secondary | ICD-10-CM | POA: Diagnosis not present

## 2018-12-23 DIAGNOSIS — E113553 Type 2 diabetes mellitus with stable proliferative diabetic retinopathy, bilateral: Secondary | ICD-10-CM | POA: Diagnosis not present

## 2018-12-23 DIAGNOSIS — H353231 Exudative age-related macular degeneration, bilateral, with active choroidal neovascularization: Secondary | ICD-10-CM | POA: Diagnosis not present

## 2018-12-24 DIAGNOSIS — Z87440 Personal history of urinary (tract) infections: Secondary | ICD-10-CM | POA: Diagnosis not present

## 2018-12-24 DIAGNOSIS — N182 Chronic kidney disease, stage 2 (mild): Secondary | ICD-10-CM | POA: Diagnosis not present

## 2018-12-24 DIAGNOSIS — I130 Hypertensive heart and chronic kidney disease with heart failure and stage 1 through stage 4 chronic kidney disease, or unspecified chronic kidney disease: Secondary | ICD-10-CM | POA: Diagnosis not present

## 2018-12-24 DIAGNOSIS — M75101 Unspecified rotator cuff tear or rupture of right shoulder, not specified as traumatic: Secondary | ICD-10-CM | POA: Diagnosis not present

## 2018-12-24 DIAGNOSIS — E1122 Type 2 diabetes mellitus with diabetic chronic kidney disease: Secondary | ICD-10-CM | POA: Diagnosis not present

## 2018-12-24 DIAGNOSIS — I5032 Chronic diastolic (congestive) heart failure: Secondary | ICD-10-CM | POA: Diagnosis not present

## 2018-12-26 DIAGNOSIS — Z7984 Long term (current) use of oral hypoglycemic drugs: Secondary | ICD-10-CM | POA: Diagnosis not present

## 2018-12-26 DIAGNOSIS — I340 Nonrheumatic mitral (valve) insufficiency: Secondary | ICD-10-CM | POA: Diagnosis not present

## 2018-12-26 DIAGNOSIS — S41101D Unspecified open wound of right upper arm, subsequent encounter: Secondary | ICD-10-CM | POA: Diagnosis not present

## 2018-12-26 DIAGNOSIS — E113299 Type 2 diabetes mellitus with mild nonproliferative diabetic retinopathy without macular edema, unspecified eye: Secondary | ICD-10-CM | POA: Diagnosis not present

## 2018-12-26 DIAGNOSIS — E119 Type 2 diabetes mellitus without complications: Secondary | ICD-10-CM | POA: Diagnosis not present

## 2018-12-26 DIAGNOSIS — I10 Essential (primary) hypertension: Secondary | ICD-10-CM | POA: Diagnosis not present

## 2018-12-26 DIAGNOSIS — I5032 Chronic diastolic (congestive) heart failure: Secondary | ICD-10-CM | POA: Diagnosis not present

## 2018-12-27 DIAGNOSIS — Z87440 Personal history of urinary (tract) infections: Secondary | ICD-10-CM | POA: Diagnosis not present

## 2018-12-27 DIAGNOSIS — I5032 Chronic diastolic (congestive) heart failure: Secondary | ICD-10-CM | POA: Diagnosis not present

## 2018-12-27 DIAGNOSIS — M75101 Unspecified rotator cuff tear or rupture of right shoulder, not specified as traumatic: Secondary | ICD-10-CM | POA: Diagnosis not present

## 2018-12-27 DIAGNOSIS — E1122 Type 2 diabetes mellitus with diabetic chronic kidney disease: Secondary | ICD-10-CM | POA: Diagnosis not present

## 2018-12-27 DIAGNOSIS — N182 Chronic kidney disease, stage 2 (mild): Secondary | ICD-10-CM | POA: Diagnosis not present

## 2018-12-27 DIAGNOSIS — I130 Hypertensive heart and chronic kidney disease with heart failure and stage 1 through stage 4 chronic kidney disease, or unspecified chronic kidney disease: Secondary | ICD-10-CM | POA: Diagnosis not present

## 2018-12-31 DIAGNOSIS — M75101 Unspecified rotator cuff tear or rupture of right shoulder, not specified as traumatic: Secondary | ICD-10-CM | POA: Diagnosis not present

## 2018-12-31 DIAGNOSIS — N182 Chronic kidney disease, stage 2 (mild): Secondary | ICD-10-CM | POA: Diagnosis not present

## 2018-12-31 DIAGNOSIS — Z87440 Personal history of urinary (tract) infections: Secondary | ICD-10-CM | POA: Diagnosis not present

## 2018-12-31 DIAGNOSIS — E1122 Type 2 diabetes mellitus with diabetic chronic kidney disease: Secondary | ICD-10-CM | POA: Diagnosis not present

## 2018-12-31 DIAGNOSIS — I130 Hypertensive heart and chronic kidney disease with heart failure and stage 1 through stage 4 chronic kidney disease, or unspecified chronic kidney disease: Secondary | ICD-10-CM | POA: Diagnosis not present

## 2018-12-31 DIAGNOSIS — I5032 Chronic diastolic (congestive) heart failure: Secondary | ICD-10-CM | POA: Diagnosis not present

## 2019-01-01 DIAGNOSIS — N182 Chronic kidney disease, stage 2 (mild): Secondary | ICD-10-CM | POA: Diagnosis not present

## 2019-01-01 DIAGNOSIS — E1122 Type 2 diabetes mellitus with diabetic chronic kidney disease: Secondary | ICD-10-CM | POA: Diagnosis not present

## 2019-01-01 DIAGNOSIS — I130 Hypertensive heart and chronic kidney disease with heart failure and stage 1 through stage 4 chronic kidney disease, or unspecified chronic kidney disease: Secondary | ICD-10-CM | POA: Diagnosis not present

## 2019-01-01 DIAGNOSIS — Z87440 Personal history of urinary (tract) infections: Secondary | ICD-10-CM | POA: Diagnosis not present

## 2019-01-01 DIAGNOSIS — M75101 Unspecified rotator cuff tear or rupture of right shoulder, not specified as traumatic: Secondary | ICD-10-CM | POA: Diagnosis not present

## 2019-01-01 DIAGNOSIS — I5032 Chronic diastolic (congestive) heart failure: Secondary | ICD-10-CM | POA: Diagnosis not present

## 2019-01-02 DIAGNOSIS — Z5189 Encounter for other specified aftercare: Secondary | ICD-10-CM | POA: Diagnosis not present

## 2019-01-03 DIAGNOSIS — Z87440 Personal history of urinary (tract) infections: Secondary | ICD-10-CM | POA: Diagnosis not present

## 2019-01-03 DIAGNOSIS — N182 Chronic kidney disease, stage 2 (mild): Secondary | ICD-10-CM | POA: Diagnosis not present

## 2019-01-03 DIAGNOSIS — M75101 Unspecified rotator cuff tear or rupture of right shoulder, not specified as traumatic: Secondary | ICD-10-CM | POA: Diagnosis not present

## 2019-01-03 DIAGNOSIS — I130 Hypertensive heart and chronic kidney disease with heart failure and stage 1 through stage 4 chronic kidney disease, or unspecified chronic kidney disease: Secondary | ICD-10-CM | POA: Diagnosis not present

## 2019-01-03 DIAGNOSIS — I5032 Chronic diastolic (congestive) heart failure: Secondary | ICD-10-CM | POA: Diagnosis not present

## 2019-01-03 DIAGNOSIS — E1122 Type 2 diabetes mellitus with diabetic chronic kidney disease: Secondary | ICD-10-CM | POA: Diagnosis not present

## 2019-01-07 DIAGNOSIS — I5032 Chronic diastolic (congestive) heart failure: Secondary | ICD-10-CM | POA: Diagnosis not present

## 2019-01-07 DIAGNOSIS — I130 Hypertensive heart and chronic kidney disease with heart failure and stage 1 through stage 4 chronic kidney disease, or unspecified chronic kidney disease: Secondary | ICD-10-CM | POA: Diagnosis not present

## 2019-01-07 DIAGNOSIS — Z87440 Personal history of urinary (tract) infections: Secondary | ICD-10-CM | POA: Diagnosis not present

## 2019-01-07 DIAGNOSIS — H6121 Impacted cerumen, right ear: Secondary | ICD-10-CM | POA: Diagnosis not present

## 2019-01-07 DIAGNOSIS — M75101 Unspecified rotator cuff tear or rupture of right shoulder, not specified as traumatic: Secondary | ICD-10-CM | POA: Diagnosis not present

## 2019-01-07 DIAGNOSIS — R0982 Postnasal drip: Secondary | ICD-10-CM | POA: Diagnosis not present

## 2019-01-07 DIAGNOSIS — J310 Chronic rhinitis: Secondary | ICD-10-CM | POA: Insufficient documentation

## 2019-01-07 DIAGNOSIS — N182 Chronic kidney disease, stage 2 (mild): Secondary | ICD-10-CM | POA: Diagnosis not present

## 2019-01-07 DIAGNOSIS — E1122 Type 2 diabetes mellitus with diabetic chronic kidney disease: Secondary | ICD-10-CM | POA: Diagnosis not present

## 2019-01-08 DIAGNOSIS — N182 Chronic kidney disease, stage 2 (mild): Secondary | ICD-10-CM | POA: Diagnosis not present

## 2019-01-08 DIAGNOSIS — I130 Hypertensive heart and chronic kidney disease with heart failure and stage 1 through stage 4 chronic kidney disease, or unspecified chronic kidney disease: Secondary | ICD-10-CM | POA: Diagnosis not present

## 2019-01-08 DIAGNOSIS — Z87440 Personal history of urinary (tract) infections: Secondary | ICD-10-CM | POA: Diagnosis not present

## 2019-01-08 DIAGNOSIS — E1122 Type 2 diabetes mellitus with diabetic chronic kidney disease: Secondary | ICD-10-CM | POA: Diagnosis not present

## 2019-01-08 DIAGNOSIS — M75101 Unspecified rotator cuff tear or rupture of right shoulder, not specified as traumatic: Secondary | ICD-10-CM | POA: Diagnosis not present

## 2019-01-08 DIAGNOSIS — I5032 Chronic diastolic (congestive) heart failure: Secondary | ICD-10-CM | POA: Diagnosis not present

## 2019-01-09 DIAGNOSIS — Z87440 Personal history of urinary (tract) infections: Secondary | ICD-10-CM | POA: Diagnosis not present

## 2019-01-09 DIAGNOSIS — I5032 Chronic diastolic (congestive) heart failure: Secondary | ICD-10-CM | POA: Diagnosis not present

## 2019-01-09 DIAGNOSIS — M75101 Unspecified rotator cuff tear or rupture of right shoulder, not specified as traumatic: Secondary | ICD-10-CM | POA: Diagnosis not present

## 2019-01-09 DIAGNOSIS — E1122 Type 2 diabetes mellitus with diabetic chronic kidney disease: Secondary | ICD-10-CM | POA: Diagnosis not present

## 2019-01-09 DIAGNOSIS — N182 Chronic kidney disease, stage 2 (mild): Secondary | ICD-10-CM | POA: Diagnosis not present

## 2019-01-09 DIAGNOSIS — I130 Hypertensive heart and chronic kidney disease with heart failure and stage 1 through stage 4 chronic kidney disease, or unspecified chronic kidney disease: Secondary | ICD-10-CM | POA: Diagnosis not present

## 2019-01-10 DIAGNOSIS — M75101 Unspecified rotator cuff tear or rupture of right shoulder, not specified as traumatic: Secondary | ICD-10-CM | POA: Diagnosis not present

## 2019-01-10 DIAGNOSIS — I130 Hypertensive heart and chronic kidney disease with heart failure and stage 1 through stage 4 chronic kidney disease, or unspecified chronic kidney disease: Secondary | ICD-10-CM | POA: Diagnosis not present

## 2019-01-10 DIAGNOSIS — E1122 Type 2 diabetes mellitus with diabetic chronic kidney disease: Secondary | ICD-10-CM | POA: Diagnosis not present

## 2019-01-10 DIAGNOSIS — N182 Chronic kidney disease, stage 2 (mild): Secondary | ICD-10-CM | POA: Diagnosis not present

## 2019-01-10 DIAGNOSIS — Z87440 Personal history of urinary (tract) infections: Secondary | ICD-10-CM | POA: Diagnosis not present

## 2019-01-10 DIAGNOSIS — I5032 Chronic diastolic (congestive) heart failure: Secondary | ICD-10-CM | POA: Diagnosis not present

## 2019-01-13 DIAGNOSIS — E1122 Type 2 diabetes mellitus with diabetic chronic kidney disease: Secondary | ICD-10-CM | POA: Diagnosis not present

## 2019-01-13 DIAGNOSIS — Z87440 Personal history of urinary (tract) infections: Secondary | ICD-10-CM | POA: Diagnosis not present

## 2019-01-13 DIAGNOSIS — N182 Chronic kidney disease, stage 2 (mild): Secondary | ICD-10-CM | POA: Diagnosis not present

## 2019-01-13 DIAGNOSIS — I5032 Chronic diastolic (congestive) heart failure: Secondary | ICD-10-CM | POA: Diagnosis not present

## 2019-01-13 DIAGNOSIS — M75101 Unspecified rotator cuff tear or rupture of right shoulder, not specified as traumatic: Secondary | ICD-10-CM | POA: Diagnosis not present

## 2019-01-13 DIAGNOSIS — I130 Hypertensive heart and chronic kidney disease with heart failure and stage 1 through stage 4 chronic kidney disease, or unspecified chronic kidney disease: Secondary | ICD-10-CM | POA: Diagnosis not present

## 2019-01-15 ENCOUNTER — Encounter: Payer: Self-pay | Admitting: Podiatry

## 2019-01-15 ENCOUNTER — Other Ambulatory Visit: Payer: Self-pay

## 2019-01-15 ENCOUNTER — Ambulatory Visit (INDEPENDENT_AMBULATORY_CARE_PROVIDER_SITE_OTHER): Payer: Medicare Other | Admitting: Podiatry

## 2019-01-15 VITALS — Temp 97.5°F

## 2019-01-15 DIAGNOSIS — M79675 Pain in left toe(s): Secondary | ICD-10-CM

## 2019-01-15 DIAGNOSIS — M79672 Pain in left foot: Secondary | ICD-10-CM

## 2019-01-15 DIAGNOSIS — L84 Corns and callosities: Secondary | ICD-10-CM

## 2019-01-15 DIAGNOSIS — M79674 Pain in right toe(s): Secondary | ICD-10-CM | POA: Diagnosis not present

## 2019-01-15 DIAGNOSIS — E1142 Type 2 diabetes mellitus with diabetic polyneuropathy: Secondary | ICD-10-CM | POA: Diagnosis not present

## 2019-01-15 DIAGNOSIS — E119 Type 2 diabetes mellitus without complications: Secondary | ICD-10-CM | POA: Diagnosis not present

## 2019-01-15 DIAGNOSIS — B351 Tinea unguium: Secondary | ICD-10-CM

## 2019-01-15 DIAGNOSIS — M79671 Pain in right foot: Secondary | ICD-10-CM | POA: Diagnosis not present

## 2019-01-15 NOTE — Patient Instructions (Signed)
Diabetes Mellitus and Foot Care  Foot care is an important part of your health, especially when you have diabetes. Diabetes may cause you to have problems because of poor blood flow (circulation) to your feet and legs, which can cause your skin to:   Become thinner and drier.   Break more easily.   Heal more slowly.   Peel and crack.  You may also have nerve damage (neuropathy) in your legs and feet, causing decreased feeling in them. This means that you may not notice minor injuries to your feet that could lead to more serious problems. Noticing and addressing any potential problems early is the best way to prevent future foot problems.  How to care for your feet  Foot hygiene   Wash your feet daily with warm water and mild soap. Do not use hot water. Then, pat your feet and the areas between your toes until they are completely dry. Do not soak your feet as this can dry your skin.   Trim your toenails straight across. Do not dig under them or around the cuticle. File the edges of your nails with an emery board or nail file.   Apply a moisturizing lotion or petroleum jelly to the skin on your feet and to dry, brittle toenails. Use lotion that does not contain alcohol and is unscented. Do not apply lotion between your toes.  Shoes and socks   Wear clean socks or stockings every day. Make sure they are not too tight. Do not wear knee-high stockings since they may decrease blood flow to your legs.   Wear shoes that fit properly and have enough cushioning. Always look in your shoes before you put them on to be sure there are no objects inside.   To break in new shoes, wear them for just a few hours a day. This prevents injuries on your feet.  Wounds, scrapes, corns, and calluses   Check your feet daily for blisters, cuts, bruises, sores, and redness. If you cannot see the bottom of your feet, use a mirror or ask someone for help.   Do not cut corns or calluses or try to remove them with medicine.   If you  find a minor scrape, cut, or break in the skin on your feet, keep it and the skin around it clean and dry. You may clean these areas with mild soap and water. Do not clean the area with peroxide, alcohol, or iodine.   If you have a wound, scrape, corn, or callus on your foot, look at it several times a day to make sure it is healing and not infected. Check for:  ? Redness, swelling, or pain.  ? Fluid or blood.  ? Warmth.  ? Pus or a bad smell.  General instructions   Do not cross your legs. This may decrease blood flow to your feet.   Do not use heating pads or hot water bottles on your feet. They may burn your skin. If you have lost feeling in your feet or legs, you may not know this is happening until it is too late.   Protect your feet from hot and cold by wearing shoes, such as at the beach or on hot pavement.   Schedule a complete foot exam at least once a year (annually) or more often if you have foot problems. If you have foot problems, report any cuts, sores, or bruises to your health care provider immediately.  Contact a health care provider if:     You have a medical condition that increases your risk of infection and you have any cuts, sores, or bruises on your feet.   You have an injury that is not healing.   You have redness on your legs or feet.   You feel burning or tingling in your legs or feet.   You have pain or cramps in your legs and feet.   Your legs or feet are numb.   Your feet always feel cold.   You have pain around a toenail.  Get help right away if:   You have a wound, scrape, corn, or callus on your foot and:  ? You have pain, swelling, or redness that gets worse.  ? You have fluid or blood coming from the wound, scrape, corn, or callus.  ? Your wound, scrape, corn, or callus feels warm to the touch.  ? You have pus or a bad smell coming from the wound, scrape, corn, or callus.  ? You have a fever.  ? You have a red line going up your leg.  Summary   Check your feet every day  for cuts, sores, red spots, swelling, and blisters.   Moisturize feet and legs daily.   Wear shoes that fit properly and have enough cushioning.   If you have foot problems, report any cuts, sores, or bruises to your health care provider immediately.   Schedule a complete foot exam at least once a year (annually) or more often if you have foot problems.  This information is not intended to replace advice given to you by your health care provider. Make sure you discuss any questions you have with your health care provider.  Document Released: 07/28/2000 Document Revised: 09/12/2017 Document Reviewed: 09/01/2016  Elsevier Interactive Patient Education  2019 [employer]

## 2019-01-17 DIAGNOSIS — N182 Chronic kidney disease, stage 2 (mild): Secondary | ICD-10-CM | POA: Diagnosis not present

## 2019-01-17 DIAGNOSIS — I5032 Chronic diastolic (congestive) heart failure: Secondary | ICD-10-CM | POA: Diagnosis not present

## 2019-01-17 DIAGNOSIS — M75101 Unspecified rotator cuff tear or rupture of right shoulder, not specified as traumatic: Secondary | ICD-10-CM | POA: Diagnosis not present

## 2019-01-17 DIAGNOSIS — Z87440 Personal history of urinary (tract) infections: Secondary | ICD-10-CM | POA: Diagnosis not present

## 2019-01-17 DIAGNOSIS — E1122 Type 2 diabetes mellitus with diabetic chronic kidney disease: Secondary | ICD-10-CM | POA: Diagnosis not present

## 2019-01-17 DIAGNOSIS — I130 Hypertensive heart and chronic kidney disease with heart failure and stage 1 through stage 4 chronic kidney disease, or unspecified chronic kidney disease: Secondary | ICD-10-CM | POA: Diagnosis not present

## 2019-01-19 ENCOUNTER — Encounter: Payer: Self-pay | Admitting: Podiatry

## 2019-01-19 DIAGNOSIS — Z96611 Presence of right artificial shoulder joint: Secondary | ICD-10-CM | POA: Diagnosis not present

## 2019-01-19 DIAGNOSIS — I130 Hypertensive heart and chronic kidney disease with heart failure and stage 1 through stage 4 chronic kidney disease, or unspecified chronic kidney disease: Secondary | ICD-10-CM | POA: Diagnosis not present

## 2019-01-19 DIAGNOSIS — I5032 Chronic diastolic (congestive) heart failure: Secondary | ICD-10-CM | POA: Diagnosis not present

## 2019-01-19 DIAGNOSIS — E1122 Type 2 diabetes mellitus with diabetic chronic kidney disease: Secondary | ICD-10-CM | POA: Diagnosis not present

## 2019-01-19 DIAGNOSIS — N182 Chronic kidney disease, stage 2 (mild): Secondary | ICD-10-CM | POA: Diagnosis not present

## 2019-01-19 DIAGNOSIS — Z7982 Long term (current) use of aspirin: Secondary | ICD-10-CM | POA: Diagnosis not present

## 2019-01-19 DIAGNOSIS — Z87440 Personal history of urinary (tract) infections: Secondary | ICD-10-CM | POA: Diagnosis not present

## 2019-01-19 DIAGNOSIS — M75101 Unspecified rotator cuff tear or rupture of right shoulder, not specified as traumatic: Secondary | ICD-10-CM | POA: Diagnosis not present

## 2019-01-19 DIAGNOSIS — Z7984 Long term (current) use of oral hypoglycemic drugs: Secondary | ICD-10-CM | POA: Diagnosis not present

## 2019-01-19 NOTE — Progress Notes (Signed)
Subjective: Heidi Dominguez is a 85 y.o. y.o. female who presents for preventative diabetic foot care on today.  She has elongated, tender mycotic toenails b/l and calluses.    She states she is now back home from rehab and she is glad to be back home.  Lajean Manes, MD is her PCP.  She states her blood sugar was 186 mg/dl this morning.  She complains about painful heels on the posterior aspect b/l feet for the past two months. She has tried elevating on pillows. Pain is described as achy and it comes and goes at night.   Current Outpatient Medications:  .  acetaminophen (TYLENOL) 325 MG tablet, Take 650 mg by mouth 3 (three) times daily as needed (pain.)., Disp: , Rfl:  .  albuterol (PROVENTIL HFA;VENTOLIN HFA) 108 (90 Base) MCG/ACT inhaler, Inhale into the lungs every 6 (six) hours as needed for wheezing or shortness of breath., Disp: , Rfl:  .  amLODipine (NORVASC) 5 MG tablet, Take 1 tablet (5 mg total) by mouth daily., Disp: , Rfl:  .  aspirin EC 81 MG tablet, Take 81 mg by mouth daily., Disp: , Rfl:  .  Cholecalciferol (VITAMIN D-3) 125 MCG (5000 UT) TABS, Take 5,000 Units by mouth daily., Disp: , Rfl:  .  ciprofloxacin (CIPRO) 500 MG tablet, Take 1 tablet (500 mg total) by mouth 2 (two) times daily., Disp: 60 tablet, Rfl: 0 .  Emollient (AQUAPHOR EX), Apply 1 application topically daily. Apply bilaterally to feet, Disp: , Rfl:  .  famotidine (PEPCID) 10 MG tablet, Take 20 mg by mouth at bedtime., Disp: , Rfl:  .  Ferrous Gluconate (IRON) 240 (27 FE) MG TABS, Take 240 mg by mouth daily. , Disp: , Rfl:  .  folic acid (FOLVITE) 790 MCG tablet, Take 400 mcg by mouth daily., Disp: , Rfl:  .  hydrochlorothiazide (HYDRODIURIL) 12.5 MG tablet, Take 6.25 mg by mouth daily., Disp: , Rfl:  .  JANUVIA 50 MG tablet, Take 50 mg by mouth daily. , Disp: , Rfl: 5 .  Lido-Capsaicin-Men-Methyl Sal (1ST MEDX-PATCH/ LIDOCAINE) 4-0.025-5-20 % PTCH, Apply 1 patch topically daily., Disp: , Rfl:  .   loratadine (CLARITIN) 10 MG tablet, Take 10 mg by mouth daily at 8 pm. , Disp: , Rfl:  .  magnesium oxide (MAG-OX) 400 (241.3 Mg) MG tablet, Take 1 tablet (400 mg total) by mouth daily., Disp: , Rfl:  .  pantoprazole (PROTONIX) 40 MG tablet, Take 40 mg by mouth daily., Disp: , Rfl:  .  Polyethyl Glycol-Propyl Glycol (SYSTANE) 0.4-0.3 % SOLN, Place 2 drops into both eyes every 4 (four) hours as needed (dry eyes)., Disp: , Rfl:  .  polyethylene glycol (MIRALAX / GLYCOLAX) packet, Take 17 g by mouth daily as needed for mild constipation., Disp: , Rfl:  .  senna-docusate (SENOKOT-S) 8.6-50 MG tablet, Take 2 tablets by mouth 2 (two) times daily. (Patient taking differently: Take 2 tablets by mouth 2 (two) times daily as needed (constipation.). ), Disp: 30 tablet, Rfl: 0 .  traMADol (ULTRAM) 50 MG tablet, Take 1 tablet (50 mg total) by mouth every 6 (six) hours as needed for moderate pain or severe pain., Disp: 30 tablet, Rfl: 0 .  vitamin B-12 (CYANOCOBALAMIN) 1000 MCG tablet, Take 1,000 mcg by mouth daily., Disp: , Rfl:   Allergies  Allergen Reactions  . Catapres [Clonidine Hcl] Other (See Comments)    Unknown. Unknown.  . Codeine Other (See Comments)    "Makes me out  of my head, loopy"  . Covera-Hs [Verapamil Hcl] Other (See Comments)    Unknown.  Marland Kitchen Hydrocodone Other (See Comments)    "makes me go out of my head, loopy"  . Other Other (See Comments)    Any type of narcotics Feels "loopy" Any type of narcotics Feels "loopy"  . Sulfa Antibiotics Itching  . Sulfamethoxazole Diarrhea  . Verapamil Diarrhea and Other (See Comments)  . Sulfasalazine Itching    Objective: Vitals:   01/15/19 1103  Temp: (!) 97.5 F (36.4 C)    Vascular Examination: Capillary refill time <3 seconds x 10 digits.  Dorsalis pedis pulses 1/4 b/l.  Posterior tibial pulses absent b/l.  Digital hair absent x 10 digits.  Skin temperature gradient warm to cool b/l.  Dermatological Examination: Skin with  normal turgor, texture and tone b/l.  Toenails 1-5 b/l discolored, thick, dystrophic with subungual debris and pain with palpation to nailbeds due to thickness of nails.  Hyperkeratotic lesions dorsal 5th PIPJ b/l  and submet head 1 b/l . No erythema, no edema, no drainage, no flocculence noted.   Posterior aspect of both heels show no evidence of breakdown, but are tender to palpation. No blistering, no erythema, no flocculence, no warmth.  Musculoskeletal: Muscle strength 5/5 to all LE muscle groups.  HAV with bunion b/l.  Neurological: Sensation diminished with 10 gram monofilament.  Vibratory sensation diminished b/l.  Assessment: 1. Painful onychomycosis toenails 1-5 b/l 2.  Callus submet head 1 b/l 3.  Corn b/l 5th digits 4.  NIDDM with neuropathy  Plan: 1. Continue diabetic foot care principles. Literature dispensed on today. 2. Toenails 1-5 b/l were debrided in length and girth without iatrogenic bleeding. 3. Hyperkeratotic lesion(s) dorsal 5th PIPJ b/l  and submet head 1 b/l pared with sterile scalpel blade without incident. 4. She has no breakdown of her heels. I did recommendone pair of heel pillows for her to wear. 5. She was measured for diabetic shoes on today. Foam impressions were taken of her feet on today. 6. Patient to continue soft, supportive shoe gear daily. 7. Patient to report any pedal injuries to medical professional immediately. 8. Follow up 3 months.  9. Patient/POA to call should there be a concern in the interim.

## 2019-01-21 DIAGNOSIS — I5032 Chronic diastolic (congestive) heart failure: Secondary | ICD-10-CM | POA: Diagnosis not present

## 2019-01-21 DIAGNOSIS — E1122 Type 2 diabetes mellitus with diabetic chronic kidney disease: Secondary | ICD-10-CM | POA: Diagnosis not present

## 2019-01-21 DIAGNOSIS — I130 Hypertensive heart and chronic kidney disease with heart failure and stage 1 through stage 4 chronic kidney disease, or unspecified chronic kidney disease: Secondary | ICD-10-CM | POA: Diagnosis not present

## 2019-01-21 DIAGNOSIS — M75101 Unspecified rotator cuff tear or rupture of right shoulder, not specified as traumatic: Secondary | ICD-10-CM | POA: Diagnosis not present

## 2019-01-21 DIAGNOSIS — Z87440 Personal history of urinary (tract) infections: Secondary | ICD-10-CM | POA: Diagnosis not present

## 2019-01-21 DIAGNOSIS — N182 Chronic kidney disease, stage 2 (mild): Secondary | ICD-10-CM | POA: Diagnosis not present

## 2019-01-22 ENCOUNTER — Other Ambulatory Visit: Payer: Medicare Other | Admitting: Orthotics

## 2019-01-22 ENCOUNTER — Ambulatory Visit: Payer: Medicare Other | Admitting: Podiatry

## 2019-01-22 DIAGNOSIS — Z87440 Personal history of urinary (tract) infections: Secondary | ICD-10-CM | POA: Diagnosis not present

## 2019-01-22 DIAGNOSIS — E1122 Type 2 diabetes mellitus with diabetic chronic kidney disease: Secondary | ICD-10-CM | POA: Diagnosis not present

## 2019-01-22 DIAGNOSIS — M75101 Unspecified rotator cuff tear or rupture of right shoulder, not specified as traumatic: Secondary | ICD-10-CM | POA: Diagnosis not present

## 2019-01-22 DIAGNOSIS — N182 Chronic kidney disease, stage 2 (mild): Secondary | ICD-10-CM | POA: Diagnosis not present

## 2019-01-22 DIAGNOSIS — I130 Hypertensive heart and chronic kidney disease with heart failure and stage 1 through stage 4 chronic kidney disease, or unspecified chronic kidney disease: Secondary | ICD-10-CM | POA: Diagnosis not present

## 2019-01-22 DIAGNOSIS — I5032 Chronic diastolic (congestive) heart failure: Secondary | ICD-10-CM | POA: Diagnosis not present

## 2019-01-29 DIAGNOSIS — I130 Hypertensive heart and chronic kidney disease with heart failure and stage 1 through stage 4 chronic kidney disease, or unspecified chronic kidney disease: Secondary | ICD-10-CM | POA: Diagnosis not present

## 2019-01-29 DIAGNOSIS — N182 Chronic kidney disease, stage 2 (mild): Secondary | ICD-10-CM | POA: Diagnosis not present

## 2019-01-29 DIAGNOSIS — Z87440 Personal history of urinary (tract) infections: Secondary | ICD-10-CM | POA: Diagnosis not present

## 2019-01-29 DIAGNOSIS — M75101 Unspecified rotator cuff tear or rupture of right shoulder, not specified as traumatic: Secondary | ICD-10-CM | POA: Diagnosis not present

## 2019-01-29 DIAGNOSIS — E1122 Type 2 diabetes mellitus with diabetic chronic kidney disease: Secondary | ICD-10-CM | POA: Diagnosis not present

## 2019-01-29 DIAGNOSIS — I5032 Chronic diastolic (congestive) heart failure: Secondary | ICD-10-CM | POA: Diagnosis not present

## 2019-02-04 DIAGNOSIS — E1122 Type 2 diabetes mellitus with diabetic chronic kidney disease: Secondary | ICD-10-CM | POA: Diagnosis not present

## 2019-02-04 DIAGNOSIS — M75101 Unspecified rotator cuff tear or rupture of right shoulder, not specified as traumatic: Secondary | ICD-10-CM | POA: Diagnosis not present

## 2019-02-04 DIAGNOSIS — I130 Hypertensive heart and chronic kidney disease with heart failure and stage 1 through stage 4 chronic kidney disease, or unspecified chronic kidney disease: Secondary | ICD-10-CM | POA: Diagnosis not present

## 2019-02-04 DIAGNOSIS — N182 Chronic kidney disease, stage 2 (mild): Secondary | ICD-10-CM | POA: Diagnosis not present

## 2019-02-04 DIAGNOSIS — I5032 Chronic diastolic (congestive) heart failure: Secondary | ICD-10-CM | POA: Diagnosis not present

## 2019-02-04 DIAGNOSIS — Z87440 Personal history of urinary (tract) infections: Secondary | ICD-10-CM | POA: Diagnosis not present

## 2019-02-05 DIAGNOSIS — E113299 Type 2 diabetes mellitus with mild nonproliferative diabetic retinopathy without macular edema, unspecified eye: Secondary | ICD-10-CM | POA: Diagnosis not present

## 2019-02-05 DIAGNOSIS — R269 Unspecified abnormalities of gait and mobility: Secondary | ICD-10-CM | POA: Diagnosis not present

## 2019-02-05 DIAGNOSIS — E1165 Type 2 diabetes mellitus with hyperglycemia: Secondary | ICD-10-CM | POA: Diagnosis not present

## 2019-02-05 DIAGNOSIS — Z7984 Long term (current) use of oral hypoglycemic drugs: Secondary | ICD-10-CM | POA: Diagnosis not present

## 2019-02-05 DIAGNOSIS — M25511 Pain in right shoulder: Secondary | ICD-10-CM | POA: Diagnosis not present

## 2019-02-05 DIAGNOSIS — E1140 Type 2 diabetes mellitus with diabetic neuropathy, unspecified: Secondary | ICD-10-CM | POA: Diagnosis not present

## 2019-02-05 DIAGNOSIS — I10 Essential (primary) hypertension: Secondary | ICD-10-CM | POA: Diagnosis not present

## 2019-02-05 DIAGNOSIS — Z79899 Other long term (current) drug therapy: Secondary | ICD-10-CM | POA: Diagnosis not present

## 2019-02-13 DIAGNOSIS — N182 Chronic kidney disease, stage 2 (mild): Secondary | ICD-10-CM | POA: Diagnosis not present

## 2019-02-13 DIAGNOSIS — I5032 Chronic diastolic (congestive) heart failure: Secondary | ICD-10-CM | POA: Diagnosis not present

## 2019-02-13 DIAGNOSIS — E1122 Type 2 diabetes mellitus with diabetic chronic kidney disease: Secondary | ICD-10-CM | POA: Diagnosis not present

## 2019-02-13 DIAGNOSIS — I130 Hypertensive heart and chronic kidney disease with heart failure and stage 1 through stage 4 chronic kidney disease, or unspecified chronic kidney disease: Secondary | ICD-10-CM | POA: Diagnosis not present

## 2019-02-13 DIAGNOSIS — Z87440 Personal history of urinary (tract) infections: Secondary | ICD-10-CM | POA: Diagnosis not present

## 2019-02-13 DIAGNOSIS — M75101 Unspecified rotator cuff tear or rupture of right shoulder, not specified as traumatic: Secondary | ICD-10-CM | POA: Diagnosis not present

## 2019-02-18 DIAGNOSIS — I5032 Chronic diastolic (congestive) heart failure: Secondary | ICD-10-CM | POA: Diagnosis not present

## 2019-02-18 DIAGNOSIS — E1122 Type 2 diabetes mellitus with diabetic chronic kidney disease: Secondary | ICD-10-CM | POA: Diagnosis not present

## 2019-02-18 DIAGNOSIS — M75101 Unspecified rotator cuff tear or rupture of right shoulder, not specified as traumatic: Secondary | ICD-10-CM | POA: Diagnosis not present

## 2019-02-18 DIAGNOSIS — Z96611 Presence of right artificial shoulder joint: Secondary | ICD-10-CM | POA: Diagnosis not present

## 2019-02-18 DIAGNOSIS — N182 Chronic kidney disease, stage 2 (mild): Secondary | ICD-10-CM | POA: Diagnosis not present

## 2019-02-18 DIAGNOSIS — I130 Hypertensive heart and chronic kidney disease with heart failure and stage 1 through stage 4 chronic kidney disease, or unspecified chronic kidney disease: Secondary | ICD-10-CM | POA: Diagnosis not present

## 2019-02-18 DIAGNOSIS — Z7982 Long term (current) use of aspirin: Secondary | ICD-10-CM | POA: Diagnosis not present

## 2019-02-18 DIAGNOSIS — Z7984 Long term (current) use of oral hypoglycemic drugs: Secondary | ICD-10-CM | POA: Diagnosis not present

## 2019-02-18 DIAGNOSIS — Z87440 Personal history of urinary (tract) infections: Secondary | ICD-10-CM | POA: Diagnosis not present

## 2019-02-19 DIAGNOSIS — M75101 Unspecified rotator cuff tear or rupture of right shoulder, not specified as traumatic: Secondary | ICD-10-CM | POA: Diagnosis not present

## 2019-02-19 DIAGNOSIS — N182 Chronic kidney disease, stage 2 (mild): Secondary | ICD-10-CM | POA: Diagnosis not present

## 2019-02-19 DIAGNOSIS — E1122 Type 2 diabetes mellitus with diabetic chronic kidney disease: Secondary | ICD-10-CM | POA: Diagnosis not present

## 2019-02-19 DIAGNOSIS — Z87440 Personal history of urinary (tract) infections: Secondary | ICD-10-CM | POA: Diagnosis not present

## 2019-02-19 DIAGNOSIS — I130 Hypertensive heart and chronic kidney disease with heart failure and stage 1 through stage 4 chronic kidney disease, or unspecified chronic kidney disease: Secondary | ICD-10-CM | POA: Diagnosis not present

## 2019-02-19 DIAGNOSIS — I5032 Chronic diastolic (congestive) heart failure: Secondary | ICD-10-CM | POA: Diagnosis not present

## 2019-02-20 DIAGNOSIS — T8450XA Infection and inflammatory reaction due to unspecified internal joint prosthesis, initial encounter: Secondary | ICD-10-CM | POA: Insufficient documentation

## 2019-02-20 DIAGNOSIS — Z96611 Presence of right artificial shoulder joint: Secondary | ICD-10-CM | POA: Diagnosis not present

## 2019-02-20 DIAGNOSIS — T8450XD Infection and inflammatory reaction due to unspecified internal joint prosthesis, subsequent encounter: Secondary | ICD-10-CM | POA: Diagnosis not present

## 2019-02-20 DIAGNOSIS — Z471 Aftercare following joint replacement surgery: Secondary | ICD-10-CM | POA: Diagnosis not present

## 2019-02-26 DIAGNOSIS — E1122 Type 2 diabetes mellitus with diabetic chronic kidney disease: Secondary | ICD-10-CM | POA: Diagnosis not present

## 2019-02-26 DIAGNOSIS — N182 Chronic kidney disease, stage 2 (mild): Secondary | ICD-10-CM | POA: Diagnosis not present

## 2019-02-26 DIAGNOSIS — Z87440 Personal history of urinary (tract) infections: Secondary | ICD-10-CM | POA: Diagnosis not present

## 2019-02-26 DIAGNOSIS — M75101 Unspecified rotator cuff tear or rupture of right shoulder, not specified as traumatic: Secondary | ICD-10-CM | POA: Diagnosis not present

## 2019-02-26 DIAGNOSIS — I5032 Chronic diastolic (congestive) heart failure: Secondary | ICD-10-CM | POA: Diagnosis not present

## 2019-02-26 DIAGNOSIS — I130 Hypertensive heart and chronic kidney disease with heart failure and stage 1 through stage 4 chronic kidney disease, or unspecified chronic kidney disease: Secondary | ICD-10-CM | POA: Diagnosis not present

## 2019-03-04 ENCOUNTER — Ambulatory Visit: Payer: Medicare Other | Admitting: Orthotics

## 2019-03-04 ENCOUNTER — Ambulatory Visit (INDEPENDENT_AMBULATORY_CARE_PROVIDER_SITE_OTHER): Payer: Medicare Other | Admitting: Sports Medicine

## 2019-03-04 ENCOUNTER — Ambulatory Visit (INDEPENDENT_AMBULATORY_CARE_PROVIDER_SITE_OTHER): Payer: Medicare Other

## 2019-03-04 ENCOUNTER — Other Ambulatory Visit: Payer: Self-pay

## 2019-03-04 ENCOUNTER — Other Ambulatory Visit: Payer: Self-pay | Admitting: Sports Medicine

## 2019-03-04 ENCOUNTER — Encounter: Payer: Self-pay | Admitting: Sports Medicine

## 2019-03-04 VITALS — Temp 98.7°F

## 2019-03-04 DIAGNOSIS — M79675 Pain in left toe(s): Secondary | ICD-10-CM | POA: Diagnosis not present

## 2019-03-04 DIAGNOSIS — S90222A Contusion of left lesser toe(s) with damage to nail, initial encounter: Secondary | ICD-10-CM | POA: Diagnosis not present

## 2019-03-04 DIAGNOSIS — L84 Corns and callosities: Secondary | ICD-10-CM

## 2019-03-04 DIAGNOSIS — E1142 Type 2 diabetes mellitus with diabetic polyneuropathy: Secondary | ICD-10-CM

## 2019-03-04 DIAGNOSIS — R269 Unspecified abnormalities of gait and mobility: Secondary | ICD-10-CM

## 2019-03-04 DIAGNOSIS — E1151 Type 2 diabetes mellitus with diabetic peripheral angiopathy without gangrene: Secondary | ICD-10-CM

## 2019-03-04 MED ORDER — NEOMYCIN-POLYMYXIN-HC 3.5-10000-1 OT SOLN: OTIC | 0 refills | Status: DC

## 2019-03-04 NOTE — Progress Notes (Signed)
Subjective: Heidi Dominguez is a 85 y.o. female patient presents to office today complaining of a darkening painful left second toe.  Patient is assisted by daughter who helps to report history of her home aide noticing the toe a few days ago looking this way.  Patient is not the best historian and does not recall how she could have done any damage to her toe.  Patient denies fever/chills/nausea/vomitting/any other related constitutional symptoms at this time.  Patient is on long-term antibiotics for a history of infection at a shoulder implant and does not qualify for surgery.  Patient also admits that she has difficulty with walking and is getting very weak per her daughter when she does walk in the home tends to drag her foot and thinks that this may be the reason why she has damage to her left second toe.  Patient Active Problem List   Diagnosis Date Noted  . Infection or inflammatory reaction due to internal joint prosthesis ([place]) 02/20/2019  . Rhinitis, chronic 01/07/2019  . Infection of shoulder ([place]) 11/08/2018  . History of hemiarthroplasty of right shoulder 09/25/2018  . UTI (urinary tract infection) 09/12/2018  . Acute pancreatitis 08/28/2018  . Acute recurrent pancreatitis   . Abdominal pain 08/23/2018  . Elevated LFTs 07/04/2018  . CKD (chronic kidney disease) stage 3, GFR 30-59 ml/min (HCC) 07/04/2018  . Upper airway cough syndrome 03/13/2018  . Sensorineural hearing loss (SNHL), bilateral 12/04/2016  . Post-nasal drainage 03/14/2016  . Presbycusis of both ears 03/14/2016  . Bilateral hearing loss 10/06/2015  . Bilateral impacted cerumen 10/06/2015  . 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 Subjective tinnitus of both ears 10/06/2015  . Choledocholithiasis with chronic cholecystitis, nonobstructing 04/11/2015  . Pre-operative cardiovascular examination, high risk surgery   . Epigastric abdominal pain 04/08/2015  . Acute gallstone pancreatitis s/p lap chole w  [facility] 04/11/2015 04/08/2015  . Diabetes mellitus type 2, controlled ([place]) 04/08/2015  . Gallstone pancreatitis 04/08/2015  . Mitral regurgitation 04/08/2015  . Chronic diastolic CHF (congestive heart failure) ([place]) 04/08/2015  . Dyspnea 03/31/2015  . Ischemic colitis ([place]) 08/09/2012  . Colitis 08/07/2012  . Rectal bleeding 08/07/2012  . Weakness 09/24/2011  . Bradycardia 09/24/2011  . Acute lower UTI 09/24/2011  . Hematochezia 09/24/2011  . Essential hypertension 09/24/2011  . Dyslipidemia 09/24/2011    Current Outpatient Medications on File Prior to Visit  Medication Sig Dispense Refill  . acetaminophen (TYLENOL) 325 MG tablet Take 650 mg by mouth 3 (three) times daily as needed (pain.).    Marland Kitchen albuterol (PROVENTIL HFA;VENTOLIN HFA) 108 (90 Base) MCG/ACT inhaler Inhale into the lungs every 6 (six) hours as needed for wheezing or shortness of breath.    Marland Kitchen amLODipine (NORVASC) 5 MG tablet Take 1 tablet (5 mg total) by mouth daily.    Marland Kitchen aspirin EC 81 MG tablet Take 81 mg by mouth daily.    . Cholecalciferol (VITAMIN D-3) 125 MCG (5000 UT) TABS Take 5,000 Units by mouth daily.    . ciprofloxacin (CIPRO) 500 MG tablet Take 1 tablet (500 mg total) by mouth 2 (two) times daily. 60 tablet 0  . Emollient (AQUAPHOR EX) Apply 1 application topically daily. Apply bilaterally to feet    . famotidine (PEPCID) 10 MG tablet Take 20 mg by mouth at bedtime.    . fentaNYL (DURAGESIC) 25 MCG/HR APP 1 PA EXT TO THE SKIN Q 72 H    . Ferrous Gluconate (IRON) 240 (27 FE) MG TABS Take 240  mg by mouth daily.     . folic acid (FOLVITE) 174 MCG tablet Take 400 mcg by mouth daily.    . furosemide (LASIX) 20 MG tablet furosemide 20 mg tablet    . hydrochlorothiazide (HYDRODIURIL) 12.5 MG tablet Take 6.25 mg by mouth daily.    Marland Kitchen JANUVIA 50 MG tablet Take 50 mg by mouth daily.   5  . Lido-Capsaicin-Men-Methyl Sal (1ST MEDX-PATCH/ LIDOCAINE) 4-0.025-5-20 % PTCH Apply 1 patch topically daily.    Marland Kitchen loratadine (CLARITIN)  10 MG tablet Take 10 mg by mouth daily at 8 pm.     . magnesium oxide (MAG-OX) 400 (241.3 Mg) MG tablet Take 1 tablet (400 mg total) by mouth daily.    . pantoprazole (PROTONIX) 40 MG tablet Take 40 mg by mouth daily.    Vladimir Faster Glycol-Propyl Glycol (SYSTANE) 0.4-0.3 % SOLN Place 2 drops into both eyes every 4 (four) hours as needed (dry eyes).    . polyethylene glycol (MIRALAX / GLYCOLAX) packet Take 17 g by mouth daily as needed for mild constipation.    . senna-docusate (SENOKOT-S) 8.6-50 MG tablet Take 2 tablets by mouth 2 (two) times daily. (Patient taking differently: Take 2 tablets by mouth 2 (two) times daily as needed (constipation.). ) 30 tablet 0  . traMADol (ULTRAM) 50 MG tablet Take 1 tablet (50 mg total) by mouth every 6 (six) hours as needed for moderate pain or severe pain. 30 tablet 0  . vitamin B-12 (CYANOCOBALAMIN) 1000 MCG tablet Take 1,000 mcg by mouth daily.     No current facility-administered medications on file prior to visit.     Allergies  Allergen Reactions  . Catapres [Clonidine Hcl] Other (See Comments)    Unknown. Unknown.  . Codeine Other (See Comments)    "Makes me out of my head, loopy"  . Covera-Hs [Verapamil Hcl] Other (See Comments)    Unknown.  Marland Kitchen Hydrocodone Other (See Comments)    "makes me go out of my head, loopy"  . Other Other (See Comments)    Any type of narcotics Feels "loopy" Any type of narcotics Feels "loopy"  . Sulfa Antibiotics Itching  . Sulfamethoxazole Diarrhea  . Verapamil Diarrhea and Other (See Comments)  . Sulfasalazine Itching    Objective:  There were no vitals filed for this visit.  General: Well developed, nourished, in no acute distress, alert and oriented x2   Dermatology: Skin is warm, dry and supple bilateral.  There is dried blood at the left second toe proximal nail fold with significant loosening of the nail noted mild bruising and discoloration surrounding the nail area likely consistent with contusion.  (+) Erythema. (+) Edema. (-) serosanguous drainage present. The remaining nails short thick with no symptoms at this time.  Unchanged chronic callus with no acute symptoms at this time.  Vascular: Dorsalis Pedis artery and Posterior Tibial artery pedal pulses are 1/4 bilateral with immedate capillary fill time.  Scant pedal hair growth present. No significant lower extremity edema.   Neruologic: Grossly intact via light touch bilateral.  Protective sensation severely diminished bilateral.  Musculoskeletal: Tenderness to palpation of the left second toenail and toe.  Chronic bunion hammertoe deformity pes planus foot type subjective gait instability with significant foot drop left greater than right and decreased muscular strength 4 out of 5 bilateral with difficulty ambulating per daughter especially at home.  X-rays left foot: There is diffuse arthritis decreased bone mineralization and significant digital and bunion deformity.  No fracture or dislocation  or any other acute findings.  Soft tissue margins within normal limits for patient status.  Assesement and Plan: Problem List Items Addressed This Visit    None    Visit Diagnoses    Contusion of lesser toe of left foot with damage to nail, initial encounter    -  Primary   Pain of toe of left foot       Abnormality of gait          -Discussed treatment alternatives and plan of care; Explained temporary nail avulsion and post procedure course to patient. - After a verbal consent and topical numbing cream was applied.  The offending loose left second toenail was then incised from the hyponychium to the epinychium. The offending nail border was removed and cleared from the field using a sterile nail nipper. The area was curretted for any remaining nail or spicules using a tissue nipper bleeding was controlled with manual pressure.  The area was dressed with antibiotic cream and a dry sterile dressing. -Patient was instructed to leave the  dressing intact for today and begin soaking  in a weak solution of betadine or Epsom salt and water tomorrow. Patient was instructed to  soak for 15-20 minutes each day and apply neosporin/corticosporin as prescribed and a gauze or bandaid dressing each day. -Patient was instructed to monitor the toe for signs of infection and return to office if toe becomes red, hot or swollen. -Advised ice, elevation, and tylenol  if needed for pain.  -Patient is to return in 2 weeks for follow up care/nail check or sooner if problems arise.  Advised patient to hold off on getting diabetic shoes and to her toe is healed and she is rechecked on next visit.  Advised daughter that we can also assess her at that visit for bracing options to help with her gait issues.  Landis Martins, DPM

## 2019-03-04 NOTE — Patient Instructions (Signed)

## 2019-03-04 NOTE — Progress Notes (Signed)
Due to a nail problem, patient did not pick up shoes.

## 2019-03-06 DIAGNOSIS — R600 Localized edema: Secondary | ICD-10-CM | POA: Diagnosis not present

## 2019-03-06 DIAGNOSIS — I10 Essential (primary) hypertension: Secondary | ICD-10-CM | POA: Diagnosis not present

## 2019-03-06 DIAGNOSIS — E11319 Type 2 diabetes mellitus with unspecified diabetic retinopathy without macular edema: Secondary | ICD-10-CM | POA: Diagnosis not present

## 2019-03-06 DIAGNOSIS — E1140 Type 2 diabetes mellitus with diabetic neuropathy, unspecified: Secondary | ICD-10-CM | POA: Diagnosis not present

## 2019-03-06 DIAGNOSIS — I5032 Chronic diastolic (congestive) heart failure: Secondary | ICD-10-CM | POA: Diagnosis not present

## 2019-03-11 ENCOUNTER — Telehealth: Payer: Self-pay

## 2019-03-11 NOTE — Telephone Encounter (Signed)
Refill for otic drop denied, no longer needed

## 2019-03-13 DIAGNOSIS — I130 Hypertensive heart and chronic kidney disease with heart failure and stage 1 through stage 4 chronic kidney disease, or unspecified chronic kidney disease: Secondary | ICD-10-CM | POA: Diagnosis not present

## 2019-03-13 DIAGNOSIS — M75101 Unspecified rotator cuff tear or rupture of right shoulder, not specified as traumatic: Secondary | ICD-10-CM | POA: Diagnosis not present

## 2019-03-13 DIAGNOSIS — N182 Chronic kidney disease, stage 2 (mild): Secondary | ICD-10-CM | POA: Diagnosis not present

## 2019-03-13 DIAGNOSIS — Z87440 Personal history of urinary (tract) infections: Secondary | ICD-10-CM | POA: Diagnosis not present

## 2019-03-13 DIAGNOSIS — E1122 Type 2 diabetes mellitus with diabetic chronic kidney disease: Secondary | ICD-10-CM | POA: Diagnosis not present

## 2019-03-13 DIAGNOSIS — I5032 Chronic diastolic (congestive) heart failure: Secondary | ICD-10-CM | POA: Diagnosis not present

## 2019-03-18 ENCOUNTER — Other Ambulatory Visit: Payer: Self-pay

## 2019-03-18 ENCOUNTER — Encounter: Payer: Self-pay | Admitting: Sports Medicine

## 2019-03-18 ENCOUNTER — Ambulatory Visit (INDEPENDENT_AMBULATORY_CARE_PROVIDER_SITE_OTHER): Payer: Medicare Other | Admitting: Orthotics

## 2019-03-18 ENCOUNTER — Ambulatory Visit (INDEPENDENT_AMBULATORY_CARE_PROVIDER_SITE_OTHER): Payer: Medicare Other | Admitting: Sports Medicine

## 2019-03-18 DIAGNOSIS — Z9889 Other specified postprocedural states: Secondary | ICD-10-CM

## 2019-03-18 DIAGNOSIS — E1151 Type 2 diabetes mellitus with diabetic peripheral angiopathy without gangrene: Secondary | ICD-10-CM

## 2019-03-18 DIAGNOSIS — R269 Unspecified abnormalities of gait and mobility: Secondary | ICD-10-CM | POA: Diagnosis not present

## 2019-03-18 DIAGNOSIS — L84 Corns and callosities: Secondary | ICD-10-CM

## 2019-03-18 DIAGNOSIS — E1142 Type 2 diabetes mellitus with diabetic polyneuropathy: Secondary | ICD-10-CM

## 2019-03-18 DIAGNOSIS — M79675 Pain in left toe(s): Secondary | ICD-10-CM

## 2019-03-18 NOTE — Patient Instructions (Signed)
Vicks vapor rub to toenails after bath or shower for thick discolored toenails

## 2019-03-18 NOTE — Progress Notes (Signed)
Subjective: Heidi Dominguez is a 85 y.o. female patient returns to office today for follow up evaluation after having left 2nd toenail removed at last visit. Patient reports that her toe is doing fine. Has a few pains in her left heel and at her bunion but otherwise she is doing ok. No other pedal complaints at this time.  Patient Active Problem List   Diagnosis Date Noted  . Infection or inflammatory reaction due to internal joint prosthesis ([place]) 02/20/2019  . Rhinitis, chronic 01/07/2019  . Infection of shoulder ([place]) 11/08/2018  . History of hemiarthroplasty of right shoulder 09/25/2018  . UTI (urinary tract infection) 09/12/2018  . Acute pancreatitis 08/28/2018  . Acute recurrent pancreatitis   . Abdominal pain 08/23/2018  . Elevated LFTs 07/04/2018  . CKD (chronic kidney disease) stage 3, GFR 30-59 ml/min (HCC) 07/04/2018  . Upper airway cough syndrome 03/13/2018  . Sensorineural hearing loss (SNHL), bilateral 12/04/2016  . Post-nasal drainage 03/14/2016  . Presbycusis of both ears 03/14/2016  . Bilateral hearing loss 10/06/2015  . Bilateral impacted cerumen 10/06/2015  . 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 Subjective tinnitus of both ears 10/06/2015  . Choledocholithiasis with chronic cholecystitis, nonobstructing 04/11/2015  . Pre-operative cardiovascular examination, high risk surgery   . Epigastric abdominal pain 04/08/2015  . Acute gallstone pancreatitis s/p lap chole w [facility] 04/11/2015 04/08/2015  . Diabetes mellitus type 2, controlled ([place]) 04/08/2015  . Gallstone pancreatitis 04/08/2015  . Mitral regurgitation 04/08/2015  . Chronic diastolic CHF (congestive heart failure) ([place]) 04/08/2015  . Dyspnea 03/31/2015  . Ischemic colitis ([place]) 08/09/2012  . Colitis 08/07/2012  . Rectal bleeding 08/07/2012  . Weakness 09/24/2011  . Bradycardia 09/24/2011  . Acute lower UTI 09/24/2011  . Hematochezia 09/24/2011  . Essential hypertension 09/24/2011  .  Dyslipidemia 09/24/2011    Current Outpatient Medications on File Prior to Visit  Medication Sig Dispense Refill  . acetaminophen (TYLENOL) 325 MG tablet Take 650 mg by mouth 3 (three) times daily as needed (pain.).    Marland Kitchen albuterol (PROVENTIL HFA;VENTOLIN HFA) 108 (90 Base) MCG/ACT inhaler Inhale into the lungs every 6 (six) hours as needed for wheezing or shortness of breath.    Marland Kitchen amLODipine (NORVASC) 10 MG tablet TK 1 T PO QD    . amLODipine (NORVASC) 5 MG tablet Take 1 tablet (5 mg total) by mouth daily.    Marland Kitchen aspirin EC 81 MG tablet Take 81 mg by mouth daily.    . Cholecalciferol (VITAMIN D-3) 125 MCG (5000 UT) TABS Take 5,000 Units by mouth daily.    . ciprofloxacin (CIPRO) 500 MG tablet Take 1 tablet (500 mg total) by mouth 2 (two) times daily. 60 tablet 0  . Emollient (AQUAPHOR EX) Apply 1 application topically daily. Apply bilaterally to feet    . famotidine (PEPCID) 10 MG tablet Take 20 mg by mouth at bedtime.    . fentaNYL (DURAGESIC) 25 MCG/HR APP 1 PA EXT TO THE SKIN Q 72 H    . Ferrous Gluconate (IRON) 240 (27 FE) MG TABS Take 240 mg by mouth daily.     . folic acid (FOLVITE) 956 MCG tablet Take 400 mcg by mouth daily.    . furosemide (LASIX) 20 MG tablet furosemide 20 mg tablet    . hydrochlorothiazide (HYDRODIURIL) 12.5 MG tablet Take 6.25 mg by mouth daily.    Marland Kitchen JANUVIA 50 MG tablet Take 50 mg by mouth daily.   5  . Lido-Capsaicin-Men-Methyl Sal (1ST MEDX-PATCH/ LIDOCAINE)  4-0.025-5-20 % PTCH Apply 1 patch topically daily.    Marland Kitchen lisinopril (ZESTRIL) 5 MG tablet TK 1 T PO QD    . loratadine (CLARITIN) 10 MG tablet Take 10 mg by mouth daily at 8 pm.     . magnesium oxide (MAG-OX) 400 (241.3 Mg) MG tablet Take 1 tablet (400 mg total) by mouth daily.    Marland Kitchen neomycin-polymyxin-hydrocortisone (CORTISPORIN) OTIC solution Apply 1-2 drops to toe after soaking twice a day 10 mL 0  . pantoprazole (PROTONIX) 40 MG tablet Take 40 mg by mouth daily.    Vladimir Faster Glycol-Propyl Glycol  (SYSTANE) 0.4-0.3 % SOLN Place 2 drops into both eyes every 4 (four) hours as needed (dry eyes).    . polyethylene glycol (MIRALAX / GLYCOLAX) packet Take 17 g by mouth daily as needed for mild constipation.    . potassium chloride (K-DUR) 10 MEQ tablet TK 1 T PO  ONCE A DAY    . senna-docusate (SENOKOT-S) 8.6-50 MG tablet Take 2 tablets by mouth 2 (two) times daily. (Patient taking differently: Take 2 tablets by mouth 2 (two) times daily as needed (constipation.). ) 30 tablet 0  . traMADol (ULTRAM) 50 MG tablet Take 1 tablet (50 mg total) by mouth every 6 (six) hours as needed for moderate pain or severe pain. 30 tablet 0  . vitamin B-12 (CYANOCOBALAMIN) 1000 MCG tablet Take 1,000 mcg by mouth daily.     No current facility-administered medications on file prior to visit.     Allergies  Allergen Reactions  . Catapres [Clonidine Hcl] Other (See Comments)    Unknown. Unknown.  . Codeine Other (See Comments)    "Makes me out of my head, loopy"  . Covera-Hs [Verapamil Hcl] Other (See Comments)    Unknown.  Marland Kitchen Hydrocodone Other (See Comments)    "makes me go out of my head, loopy"  . Other Other (See Comments)    Any type of narcotics Feels "loopy" Any type of narcotics Feels "loopy"  . Sulfa Antibiotics Itching  . Sulfamethoxazole Diarrhea  . Verapamil Diarrhea and Other (See Comments)  . Sulfasalazine Itching    Objective:  General: Well developed, nourished, in no acute distress, alert and oriented x3   Dermatology: Skin is warm, dry and supple bilateral.Left 2nd toenail well healed. Scab at 2nd toe and right 1st toe with no signs of infection. + Mycotic nails. The remaining nails appear unremarkable at this time. There are no other lesions or other signs of infection present.  Neurovascular status: Diminished. Trace lower extremity swelling; No pain with calf compression bilateral.  Musculoskeletal: Decreased tenderness to palpation of the Left 2nd toe. Unchanged bunion and foot  deformity bilateral.   Assesement and Plan: Problem List Items Addressed This Visit    None    Visit Diagnoses    S/P nail surgery    -  Primary   Toe pain, left       Type II diabetes mellitus with peripheral circulatory disorder (HCC)       Relevant Medications   amLODipine (NORVASC) 10 MG tablet   lisinopril (ZESTRIL) 5 MG tablet   Corns and callosities       Diabetic peripheral neuropathy associated with type 2 diabetes mellitus (HCC)       Relevant Medications   lisinopril (ZESTRIL) 5 MG tablet      -Examined patient  -Left 2nd toenail well healed, no dressings or soaking needed -Patient ok to get diabetic shoes at this visit since toe  is better -Recommend vicks vapor rub to toenails after shower to help with softening and with thickness, at no charge debrided nails and smoothed with bur without incident -Patient to return in 10 weeks for diabetic foot check/care.   Landis Martins, DPM

## 2019-03-18 NOTE — Progress Notes (Signed)

## 2019-03-20 DIAGNOSIS — Z96611 Presence of right artificial shoulder joint: Secondary | ICD-10-CM | POA: Diagnosis not present

## 2019-03-20 DIAGNOSIS — I5032 Chronic diastolic (congestive) heart failure: Secondary | ICD-10-CM | POA: Diagnosis not present

## 2019-03-20 DIAGNOSIS — N182 Chronic kidney disease, stage 2 (mild): Secondary | ICD-10-CM | POA: Diagnosis not present

## 2019-03-20 DIAGNOSIS — Z7984 Long term (current) use of oral hypoglycemic drugs: Secondary | ICD-10-CM | POA: Diagnosis not present

## 2019-03-20 DIAGNOSIS — Z7982 Long term (current) use of aspirin: Secondary | ICD-10-CM | POA: Diagnosis not present

## 2019-03-20 DIAGNOSIS — M75101 Unspecified rotator cuff tear or rupture of right shoulder, not specified as traumatic: Secondary | ICD-10-CM | POA: Diagnosis not present

## 2019-03-20 DIAGNOSIS — E1122 Type 2 diabetes mellitus with diabetic chronic kidney disease: Secondary | ICD-10-CM | POA: Diagnosis not present

## 2019-03-20 DIAGNOSIS — I130 Hypertensive heart and chronic kidney disease with heart failure and stage 1 through stage 4 chronic kidney disease, or unspecified chronic kidney disease: Secondary | ICD-10-CM | POA: Diagnosis not present

## 2019-03-20 DIAGNOSIS — Z87440 Personal history of urinary (tract) infections: Secondary | ICD-10-CM | POA: Diagnosis not present

## 2019-03-27 DIAGNOSIS — Z87440 Personal history of urinary (tract) infections: Secondary | ICD-10-CM | POA: Diagnosis not present

## 2019-03-27 DIAGNOSIS — M75101 Unspecified rotator cuff tear or rupture of right shoulder, not specified as traumatic: Secondary | ICD-10-CM | POA: Diagnosis not present

## 2019-03-27 DIAGNOSIS — N182 Chronic kidney disease, stage 2 (mild): Secondary | ICD-10-CM | POA: Diagnosis not present

## 2019-03-27 DIAGNOSIS — I5032 Chronic diastolic (congestive) heart failure: Secondary | ICD-10-CM | POA: Diagnosis not present

## 2019-03-27 DIAGNOSIS — E1122 Type 2 diabetes mellitus with diabetic chronic kidney disease: Secondary | ICD-10-CM | POA: Diagnosis not present

## 2019-03-27 DIAGNOSIS — I130 Hypertensive heart and chronic kidney disease with heart failure and stage 1 through stage 4 chronic kidney disease, or unspecified chronic kidney disease: Secondary | ICD-10-CM | POA: Diagnosis not present

## 2019-04-02 DIAGNOSIS — M75101 Unspecified rotator cuff tear or rupture of right shoulder, not specified as traumatic: Secondary | ICD-10-CM | POA: Diagnosis not present

## 2019-04-02 DIAGNOSIS — N182 Chronic kidney disease, stage 2 (mild): Secondary | ICD-10-CM | POA: Diagnosis not present

## 2019-04-02 DIAGNOSIS — I10 Essential (primary) hypertension: Secondary | ICD-10-CM | POA: Diagnosis not present

## 2019-04-02 DIAGNOSIS — Z87440 Personal history of urinary (tract) infections: Secondary | ICD-10-CM | POA: Diagnosis not present

## 2019-04-02 DIAGNOSIS — M25511 Pain in right shoulder: Secondary | ICD-10-CM | POA: Diagnosis not present

## 2019-04-02 DIAGNOSIS — K5901 Slow transit constipation: Secondary | ICD-10-CM | POA: Diagnosis not present

## 2019-04-02 DIAGNOSIS — E1122 Type 2 diabetes mellitus with diabetic chronic kidney disease: Secondary | ICD-10-CM | POA: Diagnosis not present

## 2019-04-02 DIAGNOSIS — I5032 Chronic diastolic (congestive) heart failure: Secondary | ICD-10-CM | POA: Diagnosis not present

## 2019-04-02 DIAGNOSIS — I130 Hypertensive heart and chronic kidney disease with heart failure and stage 1 through stage 4 chronic kidney disease, or unspecified chronic kidney disease: Secondary | ICD-10-CM | POA: Diagnosis not present

## 2019-04-02 DIAGNOSIS — E11319 Type 2 diabetes mellitus with unspecified diabetic retinopathy without macular edema: Secondary | ICD-10-CM | POA: Diagnosis not present

## 2019-04-06 ENCOUNTER — Other Ambulatory Visit: Payer: Self-pay | Admitting: Internal Medicine

## 2019-04-06 DIAGNOSIS — R058 Other specified cough: Secondary | ICD-10-CM

## 2019-04-06 DIAGNOSIS — R05 Cough: Secondary | ICD-10-CM

## 2019-04-16 ENCOUNTER — Ambulatory Visit: Payer: Medicare Other | Admitting: Podiatry

## 2019-04-18 ENCOUNTER — Other Ambulatory Visit: Payer: Self-pay

## 2019-04-18 ENCOUNTER — Ambulatory Visit: Payer: Medicare Other | Admitting: Orthotics

## 2019-04-18 DIAGNOSIS — E1142 Type 2 diabetes mellitus with diabetic polyneuropathy: Secondary | ICD-10-CM

## 2019-04-18 DIAGNOSIS — E1151 Type 2 diabetes mellitus with diabetic peripheral angiopathy without gangrene: Secondary | ICD-10-CM

## 2019-04-18 DIAGNOSIS — R269 Unspecified abnormalities of gait and mobility: Secondary | ICD-10-CM

## 2019-04-30 NOTE — Progress Notes (Signed)
Reordered shoes  

## 2019-05-05 DIAGNOSIS — Z79899 Other long term (current) drug therapy: Secondary | ICD-10-CM | POA: Diagnosis not present

## 2019-05-05 DIAGNOSIS — N390 Urinary tract infection, site not specified: Secondary | ICD-10-CM | POA: Diagnosis not present

## 2019-05-06 DIAGNOSIS — N390 Urinary tract infection, site not specified: Secondary | ICD-10-CM | POA: Diagnosis not present

## 2019-05-23 ENCOUNTER — Telehealth: Payer: Self-pay | Admitting: Internal Medicine

## 2019-05-23 DIAGNOSIS — R058 Other specified cough: Secondary | ICD-10-CM

## 2019-05-23 DIAGNOSIS — R05 Cough: Secondary | ICD-10-CM

## 2019-05-23 MED ORDER — FAMOTIDINE 20 MG PO TABS: ORAL_TABLET | ORAL | 2 refills | Status: DC

## 2019-05-23 NOTE — Telephone Encounter (Signed)
req refills on pepcid but not seen > 1 year  Refilled x one , f/u pcp or here before refills run out or use otc alternative

## 2019-05-27 ENCOUNTER — Other Ambulatory Visit: Payer: Self-pay

## 2019-05-27 ENCOUNTER — Ambulatory Visit (INDEPENDENT_AMBULATORY_CARE_PROVIDER_SITE_OTHER): Payer: Medicare Other | Admitting: Sports Medicine

## 2019-05-27 ENCOUNTER — Encounter: Payer: Self-pay | Admitting: Sports Medicine

## 2019-05-27 DIAGNOSIS — M79674 Pain in right toe(s): Secondary | ICD-10-CM

## 2019-05-27 DIAGNOSIS — E1142 Type 2 diabetes mellitus with diabetic polyneuropathy: Secondary | ICD-10-CM

## 2019-05-27 DIAGNOSIS — M79675 Pain in left toe(s): Secondary | ICD-10-CM

## 2019-05-27 DIAGNOSIS — B351 Tinea unguium: Secondary | ICD-10-CM

## 2019-05-27 DIAGNOSIS — L84 Corns and callosities: Secondary | ICD-10-CM

## 2019-05-27 NOTE — Patient Instructions (Signed)
Recommend [place] Healthy feet cream daily to dry/callus/corn areas

## 2019-05-27 NOTE — Progress Notes (Signed)
Subjective: Heidi Dominguez is a 85 y.o. female patient with history of diabetes who returns to office today complaining of pain at callus at bunion and right 1st toe and long, painful nails  while ambulating in shoes; unable to trim. Patient states that the glucose is doing fine, 114  this a.m. A1c 7.3. Patient denies any other pedal concerns at this time.   Patient Active Problem List   Diagnosis Date Noted  . Infection or inflammatory reaction due to internal joint prosthesis ([place]) 02/20/2019  . Rhinitis, chronic 01/07/2019  . Infection of shoulder ([place]) 11/08/2018  . History of hemiarthroplasty of right shoulder 09/25/2018  . UTI (urinary tract infection) 09/12/2018  . Acute pancreatitis 08/28/2018  . Acute recurrent pancreatitis   . Abdominal pain 08/23/2018  . Elevated LFTs 07/04/2018  . CKD (chronic kidney disease) stage 3, GFR 30-59 ml/min 07/04/2018  . Upper airway cough syndrome 03/13/2018  . Sensorineural hearing loss (SNHL), bilateral 12/04/2016  . Post-nasal drainage 03/14/2016  . Presbycusis of both ears 03/14/2016  . Bilateral hearing loss 10/06/2015  . Bilateral impacted cerumen 10/06/2015  . 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 Subjective tinnitus of both ears 10/06/2015  . Choledocholithiasis with chronic cholecystitis, nonobstructing 04/11/2015  . Pre-operative cardiovascular examination, high risk surgery   . Epigastric abdominal pain 04/08/2015  . Acute gallstone pancreatitis s/p lap chole w [facility] 04/11/2015 04/08/2015  . Diabetes mellitus type 2, controlled ([place]) 04/08/2015  . Gallstone pancreatitis 04/08/2015  . Mitral regurgitation 04/08/2015  . Chronic diastolic CHF (congestive heart failure) ([place]) 04/08/2015  . Dyspnea 03/31/2015  . Ischemic colitis ([place]) 08/09/2012  . Colitis 08/07/2012  . Rectal bleeding 08/07/2012  . Weakness 09/24/2011  . Bradycardia 09/24/2011  . Acute lower UTI 09/24/2011  . Hematochezia 09/24/2011  . Essential  hypertension 09/24/2011  . Dyslipidemia 09/24/2011   Current Outpatient Medications on File Prior to Visit  Medication Sig Dispense Refill  . acetaminophen (TYLENOL) 325 MG tablet Take 650 mg by mouth 3 (three) times daily as needed (pain.).    Marland Kitchen albuterol (PROVENTIL HFA;VENTOLIN HFA) 108 (90 Base) MCG/ACT inhaler Inhale into the lungs every 6 (six) hours as needed for wheezing or shortness of breath.    Marland Kitchen amLODipine (NORVASC) 10 MG tablet TK 1 T PO QD    . amLODipine (NORVASC) 5 MG tablet Take 1 tablet (5 mg total) by mouth daily.    Marland Kitchen aspirin EC 81 MG tablet Take 81 mg by mouth daily.    . Cholecalciferol (VITAMIN D-3) 125 MCG (5000 UT) TABS Take 5,000 Units by mouth daily.    . ciprofloxacin (CIPRO) 500 MG tablet Take 1 tablet (500 mg total) by mouth 2 (two) times daily. 60 tablet 0  . Emollient (AQUAPHOR EX) Apply 1 application topically daily. Apply bilaterally to feet    . famotidine (PEPCID) 20 MG tablet Take one after bfast and supper 60 tablet 2  . fentaNYL (DURAGESIC) 25 MCG/HR APP 1 PA EXT TO THE SKIN Q 72 H    . Ferrous Gluconate (IRON) 240 (27 FE) MG TABS Take 240 mg by mouth daily.     . folic acid (FOLVITE) A999333 MCG tablet Take 400 mcg by mouth daily.    . furosemide (LASIX) 20 MG tablet furosemide 20 mg tablet    . hydrochlorothiazide (HYDRODIURIL) 12.5 MG tablet Take 6.25 mg by mouth daily.    Marland Kitchen JANUVIA 50 MG tablet Take 50 mg by mouth daily.   5  . Lido-Capsaicin-Men-Methyl  Sal (1ST MEDX-PATCH/ LIDOCAINE) 4-0.025-5-20 % PTCH Apply 1 patch topically daily.    Marland Kitchen lisinopril (ZESTRIL) 5 MG tablet TK 1 T PO QD    . loratadine (CLARITIN) 10 MG tablet Take 10 mg by mouth daily at 8 pm.     . magnesium oxide (MAG-OX) 400 (241.3 Mg) MG tablet Take 1 tablet (400 mg total) by mouth daily.    Marland Kitchen neomycin-polymyxin-hydrocortisone (CORTISPORIN) OTIC solution Apply 1-2 drops to toe after soaking twice a day 10 mL 0  . pantoprazole (PROTONIX) 40 MG tablet Take 40 mg by mouth daily.    Vladimir Faster Glycol-Propyl Glycol (SYSTANE) 0.4-0.3 % SOLN Place 2 drops into both eyes every 4 (four) hours as needed (dry eyes).    . polyethylene glycol (MIRALAX / GLYCOLAX) packet Take 17 g by mouth daily as needed for mild constipation.    . potassium chloride (K-DUR) 10 MEQ tablet TK 1 T PO  ONCE A DAY    . senna-docusate (SENOKOT-S) 8.6-50 MG tablet Take 2 tablets by mouth 2 (two) times daily. (Patient taking differently: Take 2 tablets by mouth 2 (two) times daily as needed (constipation.). ) 30 tablet 0  . traMADol (ULTRAM) 50 MG tablet Take 1 tablet (50 mg total) by mouth every 6 (six) hours as needed for moderate pain or severe pain. 30 tablet 0  . vitamin B-12 (CYANOCOBALAMIN) 1000 MCG tablet Take 1,000 mcg by mouth daily.     No current facility-administered medications on file prior to visit.    Allergies  Allergen Reactions  . Catapres [Clonidine Hcl] Other (See Comments)    Unknown. Unknown.  . Codeine Other (See Comments)    "Makes me out of my head, loopy"  . Covera-Hs [Verapamil Hcl] Other (See Comments)    Unknown.  Marland Kitchen Hydrocodone Other (See Comments)    "makes me go out of my head, loopy"  . Other Other (See Comments)    Any type of narcotics Feels "loopy" Any type of narcotics Feels "loopy"  . Sulfa Antibiotics Itching  . Sulfamethoxazole Diarrhea  . Verapamil Diarrhea and Other (See Comments)  . Sulfasalazine Itching    No results found for this or any previous visit (from the past 2160 hour(s)).  Objective: General: Patient is awake, alert, and oriented x 3 and in no acute distress.  Integument: Skin is warm, dry and supple bilateral. Nails are tender, long, thickened and dystrophic with subungual debris, consistent with onychomycosis, 1-5 bilateral. No signs of infection. No open lesions. + reactive keratosis over right distal 1st toe and left bunion with minimal heme without ulceration, pre-ulcerative in nature. Remaining integument  unremarkable.  Vasculature:  Dorsalis Pedis pulse 1/4 bilateral. Posterior Tibial pulse  0/4 bilateral. Capillary fill time <3 sec 1-5 bilateral. No hair growth to the level of the digits.Temperature gradient within normal limits. + varicosities present bilateral. Trace edema present bilateral.   Neurology: The patient has intact sensation measured with a 5.07/10g Semmes Weinstein Monofilament at all pedal sites bilateral . Vibratory sensation diminished bilateral with tuning fork. No Babinski sign present bilateral.   Musculoskeletal: + Bunion, hammertoes, pes planus symptomatic pedal deformities noted bilateral. Muscular strength 4/5 in all lower extremity muscular groups bilateral without pain on range of motion. No tenderness with calf compression bilateral however there is mild tenderness to anterior shins and ankles that is diffuse as previous.   Assessment and Plan: Problem List Items Addressed This Visit    None    Visit Diagnoses  Pain due to onychomycosis of toenails of both feet    -  Primary   Callus       Diabetic peripheral neuropathy associated with type 2 diabetes mellitus ([place])         -Examined patient. -Re-discussed and educated patient on diabetic foot care, especially with regards to the vascular, neurological and musculoskeletal systems.  -Mechanically debrided using a sterile chisel blade callus 2 at left foot and right great toe at no charge and debrided all nails 1-5 bilateral using sterile nail nipper and filed with dremel without incident  -Recommend okeeffe  Healthy feet  -Continue with bunion and hammertoe padding for left as needed; dispensed new tube foam  -Continue with Diabetic shoes -Patient to return for at risk foot care in 3 months  -Patient advised to call the office if any problems or questions arise in the meantime.  Landis Martins, DPM

## 2019-06-05 DIAGNOSIS — H04123 Dry eye syndrome of bilateral lacrimal glands: Secondary | ICD-10-CM | POA: Diagnosis not present

## 2019-06-05 DIAGNOSIS — H43813 Vitreous degeneration, bilateral: Secondary | ICD-10-CM | POA: Diagnosis not present

## 2019-06-05 DIAGNOSIS — H353232 Exudative age-related macular degeneration, bilateral, with inactive choroidal neovascularization: Secondary | ICD-10-CM | POA: Diagnosis not present

## 2019-08-07 ENCOUNTER — Ambulatory Visit (INDEPENDENT_AMBULATORY_CARE_PROVIDER_SITE_OTHER): Payer: Medicare Other | Admitting: Internal Medicine

## 2019-08-07 ENCOUNTER — Encounter: Payer: Self-pay | Admitting: Internal Medicine

## 2019-08-07 ENCOUNTER — Other Ambulatory Visit: Payer: Self-pay

## 2019-08-07 DIAGNOSIS — I10 Essential (primary) hypertension: Secondary | ICD-10-CM | POA: Diagnosis not present

## 2019-08-07 DIAGNOSIS — R05 Cough: Secondary | ICD-10-CM

## 2019-08-07 DIAGNOSIS — R0600 Dyspnea, unspecified: Secondary | ICD-10-CM | POA: Diagnosis not present

## 2019-08-07 DIAGNOSIS — R0609 Other forms of dyspnea: Secondary | ICD-10-CM

## 2019-08-07 DIAGNOSIS — R058 Other specified cough: Secondary | ICD-10-CM

## 2019-08-07 DIAGNOSIS — J42 Unspecified chronic bronchitis: Secondary | ICD-10-CM

## 2019-08-07 MED ORDER — LOSARTAN POTASSIUM 50 MG PO TABS: 50.0000 mg | ORAL_TABLET | Freq: Every day | ORAL | 11 refills | Status: DC

## 2019-08-07 MED ORDER — FAMOTIDINE 20 MG PO TABS: ORAL_TABLET | ORAL | Status: DC

## 2019-08-07 MED ORDER — PANTOPRAZOLE SODIUM 40 MG PO TBEC: 40.0000 mg | DELAYED_RELEASE_TABLET | Freq: Every day | ORAL | 5 refills | Status: DC

## 2019-08-07 NOTE — Progress Notes (Signed)
Subjective:    Patient ID: Heidi Dominguez, female    DOB: 16-Nov-1920,    MRN: [id number]    Brief patient profile:  54 yobf never smoker never asthma some sinus issues in fall > spring new onset wheezing x one year not consistenlty better with advair so self referred to pulmonary clinic 03/31/2015     History of Present Illness  03/31/2015 1st [facility] office visit/ Adamary Savary  On ACEi  Chief Complaint  Patient presents with  . Pulmonary Consult    Self referral. Pt c/o non-productive cough, wheezing, and SOB with exertion x 6 months. Pt states over the past weeks symptoms have become worse. Pt also states chest soreness when in bed.   indolent onset persistent daily symptoms of cough > sob worse at hs despite propping up higher with 2 pillows but does allow sleeping ok  And does not recur until stirs in am So severe at times she gets diffuse non-pleuritic ant cp rec Target blood pressure is less 130 /80 stop lotrel (amlodipine/benapril)  Start amlopidine 5 mg daily and valsartan 160 mg one daly in place of the lotrel Take zantac (ranitidine) after bfast and also after supper  Please remember to go to the  x-ray department downstairs for your tests - we will call you with the results when they are available.   Please schedule a follow up office visit in 4 weeks, sooner if needed to see our NP Tammy and bring all meds (no med calendar for now) If not all better change normodyne to bisoprolol Late add: stop advair  for now and just use xopenex prn     03/13/2018  Acute extended  ov/Xion Debruyne re:  Chronic cough  Chief Complaint  Patient presents with  . Acute Visit    Cough and wheezing x 2 wks. Cough is non prod. She is using her ventolin 3 x daily on average. She feels her symptoms are worse in the am and at bedtime.   Dyspnea:  Walk mb uphill to house s stopping then has to sit down 100 ft  Due to sob Cough: at hs and wakes up  Her up at least once q night  to point of gagging s  vomiting  Sleeping: 30 degrees  SABA use: neb was helping  Also extremely dry mouth on anticholinergic for spastic bladder   rec Stop the fish oil  And cough drops and trospium  Only use your albuterol as a rescue medication   Pantoprazole (protonix) 40 mg   Take  30-60 min before first meal of the day and Pepcid (famotidine)  20 mg one @  bedtime until return to office - this is the best way to tell whether stomach acid is contributing to your problem.   GERD diet   Take delsym two tsp every 12 hours and supplement if needed with  Tylenol #3  mg up to 1 every 4 hours  Please remember to go to the lab and x-ray department downstairs in the basement  for your tests - we will call you with the results when they are available.   03/27/18  NP Please stop omeprazole Pantoprazole (protonix) 40 mg   Take  30-60 min before first meal of the day and Pepcid (famotidine)  20 mg one @  bedtime until return to office - this is the best way to tell whether stomach acid is contributing to your problem.   Stop the fish oil  And cough drops and  trospium  Only use your albuterol as a rescue medication   GERD (diet)  Take delsym two tsp every 12 hours and supplement if needed to suppress the urge to cough.      04/25/2018  f/u ov/Artina Minella re: cough - mostly resolved Chief Complaint  Patient presents with  . Follow-up    Still coughing occ. She does not have to use her rescue inhaler.  She has neb med- albuterol, but no neb machine.   Dyspnea:  No change  Cough: gone/ no more noct gagging Sleeping: 30 degrees SABA use: occ uses but very ineffective rec Only use your albuterol as a rescue medication  Continue Pantoprazole (protonix) 40 mg   Take  30-60 min before first meal of the day and Pepcid (famotidine)  20 mg one @  bedtime  Indefinitely      08/07/2019  Acute extended   ov/Dequavion Follette re: recurrent cough x June 2020 on ACEi assoc with sob "wheeze' Chief Complaint  Patient presents with  . Acute  Visit    increased cough and wheezing over the past few months- non prod cough. She is using her proair and xopenex nebs about once per day.   Dyspnea:  Walking across the room limited by balance and strength > sob Cough: non prod esp after meals day >> noct  Sleeping: 30 degrees electric bed  SABA use: both proair and neb don't help the wheeze or the cough nor does using cough drops 02: none  On pepcid 20 mb bid with worse dysphagia also  Sees [facility]icki for ent    No obvious day to day or daytime variability or assoc excess/ purulent sputum or mucus plugs or hemoptysis or cp or chest tightness.  sleeping without nocturnal  or early am exacerbation  of respiratory  c/o's or need for noct saba. Also denies any obvious fluctuation of symptoms with weather or environmental changes or other aggravating or alleviating factors except as outlined above   No unusual exposure hx or h/o childhood pna/ asthma or knowledge of premature birth.  Current Allergies, Complete Past Medical History, Past Surgical History, Family History, and Social History were reviewed in [employer] record.  ROS  The following are not active complaints unless bolded Hoarseness, sore throat=globus, dysphagia, dental problems, itching, sneezing,  nasal congestion or discharge of excess mucus or purulent secretions, ear ache,   fever, chills, sweats, unintended wt loss or wt gain, classically pleuritic or exertional cp,  orthopnea pnd or arm/hand swelling  or leg swelling, presyncope, palpitations, abdominal pain, anorexia, nausea, vomiting, diarrhea  or change in bowel habits or change in bladder habits, change in stools or change in urine, dysuria, hematuria,  rash, arthralgias, visual complaints, headache, numbness, weakness or ataxia or problems with walking or coordination,  change in mood or  memory.        Current Meds  Medication Sig  . albuterol (PROVENTIL HFA;VENTOLIN HFA) 108 (90 Base) MCG/ACT  inhaler Inhale into the lungs every 6 (six) hours as needed for wheezing or shortness of breath.  Marland Kitchen amLODipine (NORVASC) 10 MG tablet TK 1 T PO QD  . aspirin EC 81 MG tablet Take 81 mg by mouth daily.  . Cholecalciferol (VITAMIN D-3) 125 MCG (5000 UT) TABS Take 5,000 Units by mouth daily.  . famotidine (PEPCID) 20 MG tablet Take one after bfast and supper  . fentaNYL (DURAGESIC) 25 MCG/HR APP 1 PA EXT TO THE SKIN Q 72 H  . Ferrous Gluconate (IRON)  240 (27 FE) MG TABS Take 240 mg by mouth daily.   . furosemide (LASIX) 20 MG tablet furosemide 20 mg tablet  . JANUVIA 50 MG tablet Take 50 mg by mouth daily.   Marland Kitchen levalbuterol (XOPENEX) 1.25 MG/3ML nebulizer solution 1 vial in neb every 4 hours as needed  . lisinopril (ZESTRIL) 5 MG tablet TK 1 T PO QD  . potassium chloride (K-DUR) 10 MEQ tablet TK 1 T PO  ONCE A DAY  . vitamin B-12 (CYANOCOBALAMIN) 1000 MCG tablet Take 1,000 mcg by mouth daily.                  Objective:   Physical Exam     W/c bound elderly bf nad until starts with harsh dry upper airway coughing fits with voice use  Assoc with mild pseudowheeze    08/07/2019      156  04/25/2018        175  03/13/2018        180   03/31/15 198 lb 9.6 oz (90.084 kg)  10/17/13 199 lb (90.266 kg)  07/23/13 195 lb (88.451 kg)     full dentures    HEENT : pt wearing mask not removed for exam due to covid -19 concerns.  Ears clear bilaterally    NECK :  without JVD/Nodes/TM/ nl carotid upstrokes bilaterally   LUNGS: no acc muscle use,  Nl contour chest which is clear to A and P bilaterally without cough on insp or exp maneuvers   CV:  RRR  no s3 or murmur or increase in P2, and no edema   ABD:  soft and nontender with nl inspiratory excursion in the supine position. No bruits or organomegaly appreciated, bowel sounds nl  MS:    ext warm without deformities, calf tenderness, cyanosis or clubbing No obvious joint restrictions though in w/c so not walked  SKIN: warm and dry  without lesions    NEURO:  alert, approp, nl sensorium with  no motor or cerebellar deficits apparent.       I personally reviewed images and agree with radiology impression as follows:  Last CXR = pCXR 09/12/2018  Cardiomegaly without evidence of acute cardiopulmonary disease.       Assessment & Plan:

## 2019-08-07 NOTE — Assessment & Plan Note (Signed)
03/31/2015 d/c'd acei - 08/07/2019 d/c acei again due to cough and upper airway wheeze   In the best review of chronic cough to date ( NEJM 2016 375 [id number]) ,  ACEi are now felt to cause cough in up to  20% of pts which is a 4 fold increase from previous reports and does not include the variety of non-specific complaints we see in pulmonary clinic in pts on ACEi but previously attributed to another dx like  Copd/asthma and  include PNDS, throat and chest congestion, "bronchitis", unexplained dyspnea and noct "strangling" sensations, and hoarseness, but also  atypical /refractory GERD symptoms like dysphagia and "bad heartburn"   The only way I know  to prove this is not an "ACEi Case" is a trial off ACEi x a minimum of 4 weeks then regroup.   >>> try losartan 50 mg daily and f/u in 4 weeks   I had an extended discussion with the patient/daughter [name]  reviewing all relevant studies completed to date and  lasting 25 minutes of a 40  minute acute office  visit to re-establish     re  severe non-specific but potentially very serious refractory respiratory symptoms of uncertain and potentially multiple  etiologies.   Each maintenance medication was reviewed in detail including most importantly the difference between maintenance and prns and under what circumstances the prns are to be triggered using an action plan format that is not reflected in the computer generated alphabetically organized AVS.    Please see AVS for specific instructions unique to this office visit that I personally wrote and verbalized to the the pt in detail and then reviewed with pt  by my nurse highlighting any changes in therapy/plan of care  recommended at today's visit.

## 2019-08-07 NOTE — Assessment & Plan Note (Signed)
Onset 2015 - Allergy profile 03/13/2018 >  Eos 0.1 /  IgE   19 / RAST neg  - flared June 2020 on ACEi > d/c'd 08/07/2019  And placed back on high dose gerd rx  Upper airway cough syndrome (previously labeled PNDS),  is so named because it's frequently impossible to sort out how much is  CR/sinusitis with freq throat clearing (which can be related to primary GERD)   vs  causing  secondary (" extra esophageal")  GERD from wide swings in gastric pressure that occur with throat clearing, often  promoting self use of mint and menthol lozenges that reduce the lower esophageal sphincter tone and exacerbate the problem further in a cyclical fashion.   These are the same pts (now being labeled as having "irritable larynx syndrome" by some cough centers) who not infrequently have a history of having failed to tolerate ace inhibitors,  dry powder inhalers or biphosphonates or report having atypical/extraesophageal reflux symptoms that don't respond to standard doses of PPI  and are easily confused as having aecopd or asthma flares by even experienced allergists/ pulmonologists (myself included).    rec try off acei, max gerd rx then f/u in 4 weeks with all meds in hand using a trust but verify approach to confirm accurate Medication  Reconciliation The principal here is that until we are certain that the  patients are doing what we've asked, it makes no sense to ask them to do more.

## 2019-08-07 NOTE — Assessment & Plan Note (Addendum)
Sleeping fine with more weakness than true doe - assoc with pseudowheeze from acei which has been stopped effective today.   rec just use xopenex neb for now up to every 4 h prn  and stop hfa which may aggravate uacs then regroup in 4 weeks with max rx for gerd and off acei/ advised  Pt informed of the seriousness of COVID 19 infection as a direct risk to lung health  and safey and to close contacts and should continue to wear a facemask in public and minimize exposure to public locations but especially avoid any area or activity where non-close contacts are not observing distancing or wearing an appropriate face mask.  I strongly recommended vaccine when offered.

## 2019-08-07 NOTE — Patient Instructions (Addendum)
When short of breath or coughing >  Use your nebulizer up to every 4 hours if needed   Stop lisinopril and your cough should improve over the next few weeks  Start losartan 50 mg one daily in place of the lisinopril   Pantoprazole (protonix) 40 mg   Take  30-60 min before first meal of the day and Pepcid (famotidine)  20 mg one after supper until return to office - this is the best way to tell whether stomach acid is contributing to your problem.     GERD (REFLUX)  is an extremely common cause of respiratory symptoms just like yours , many times with no obvious heartburn at all.    It can be treated with medication, but also with lifestyle changes including elevation of the head of your bed (ideally with 6 -8inch blocks under the headboard of your bed),  Smoking cessation, avoidance of late meals, excessive alcohol, and avoid fatty foods, chocolate, peppermint, colas, red wine, and acidic juices such as orange juice.  NO MINT OR MENTHOL PRODUCTS SO NO COUGH DROPS  USE SUGARLESS CANDY INSTEAD (Jolley ranchers or Stover's or Life Savers) or even ice chips will also do - the key is to swallow to prevent all throat clearing. NO OIL BASED VITAMINS - use powdered substitutes.  Avoid fish oil when coughing.   Please schedule a follow up office visit in 4 weeks, sooner if needed

## 2019-08-08 IMAGING — DX DG ABDOMEN 2V
2 series · 2 of 2 positions shown · non-contrast
Comparison: 09/12/2017

CLINICAL DATA: Migration of biliary stent

EXAM:
ABDOMEN - 2 VIEW

[abdomen supine]
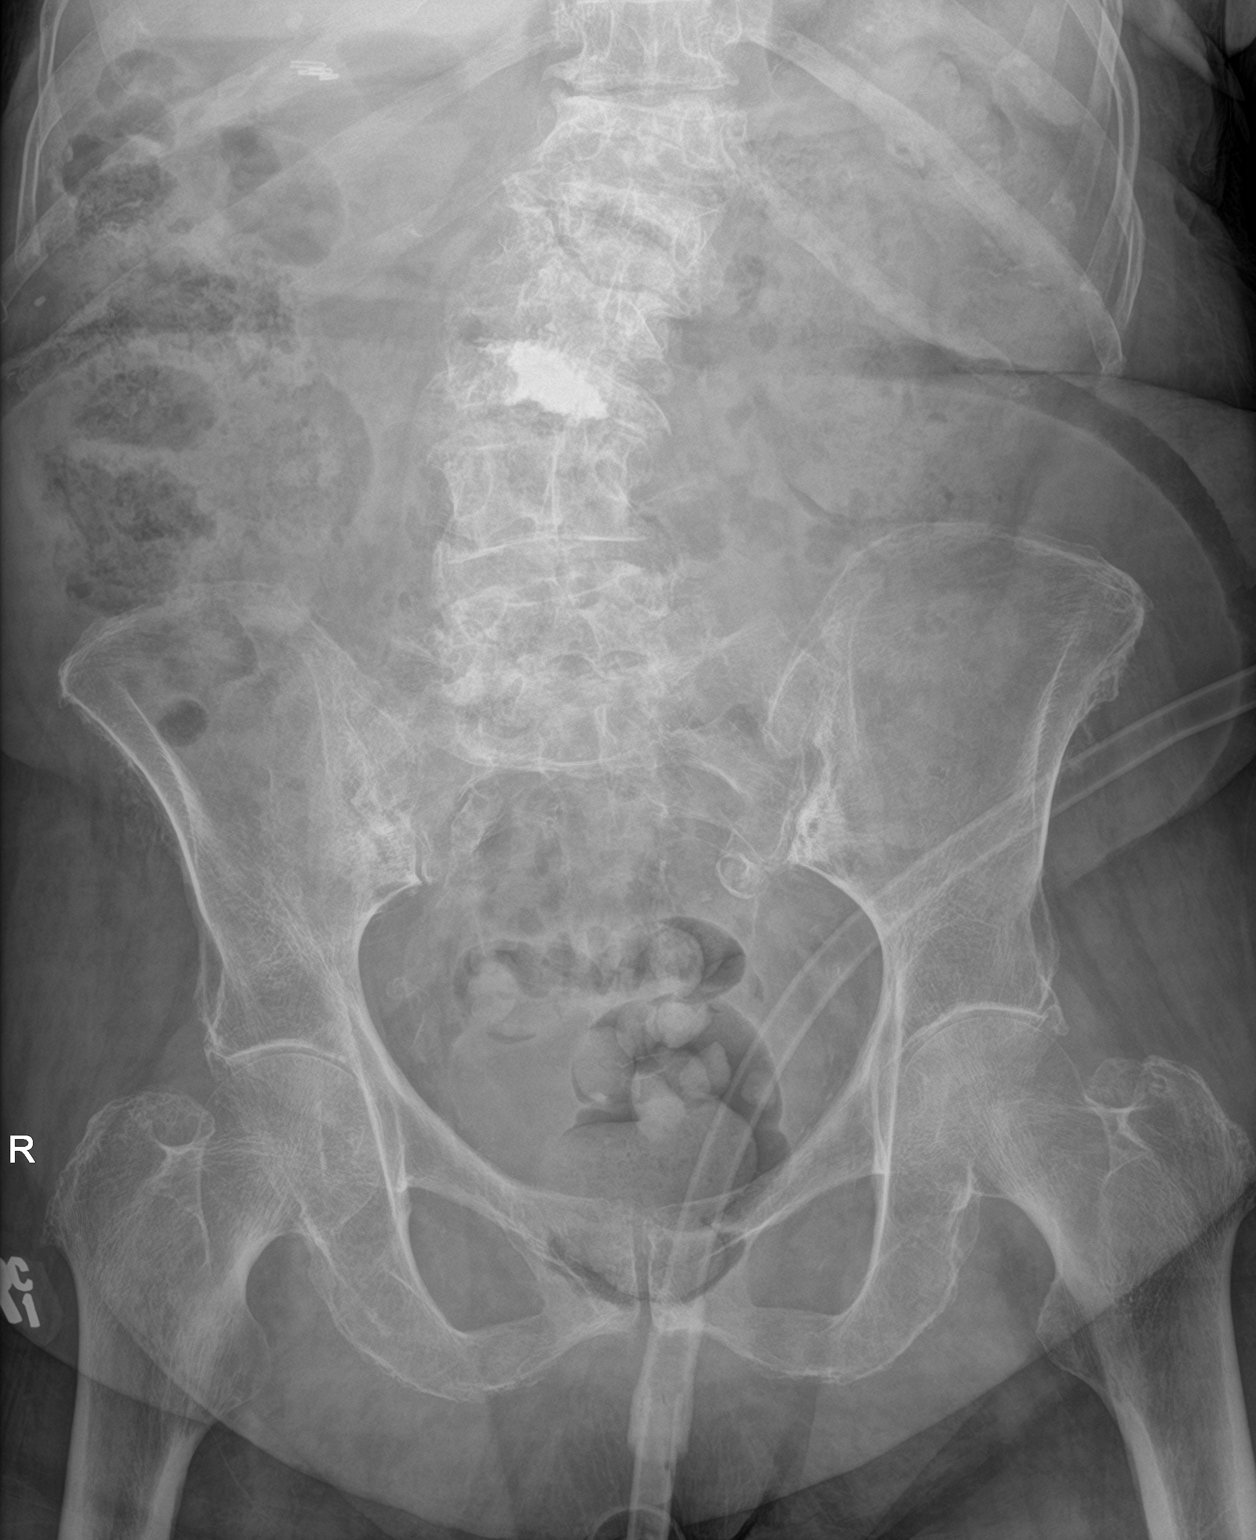

[abdomen decu]
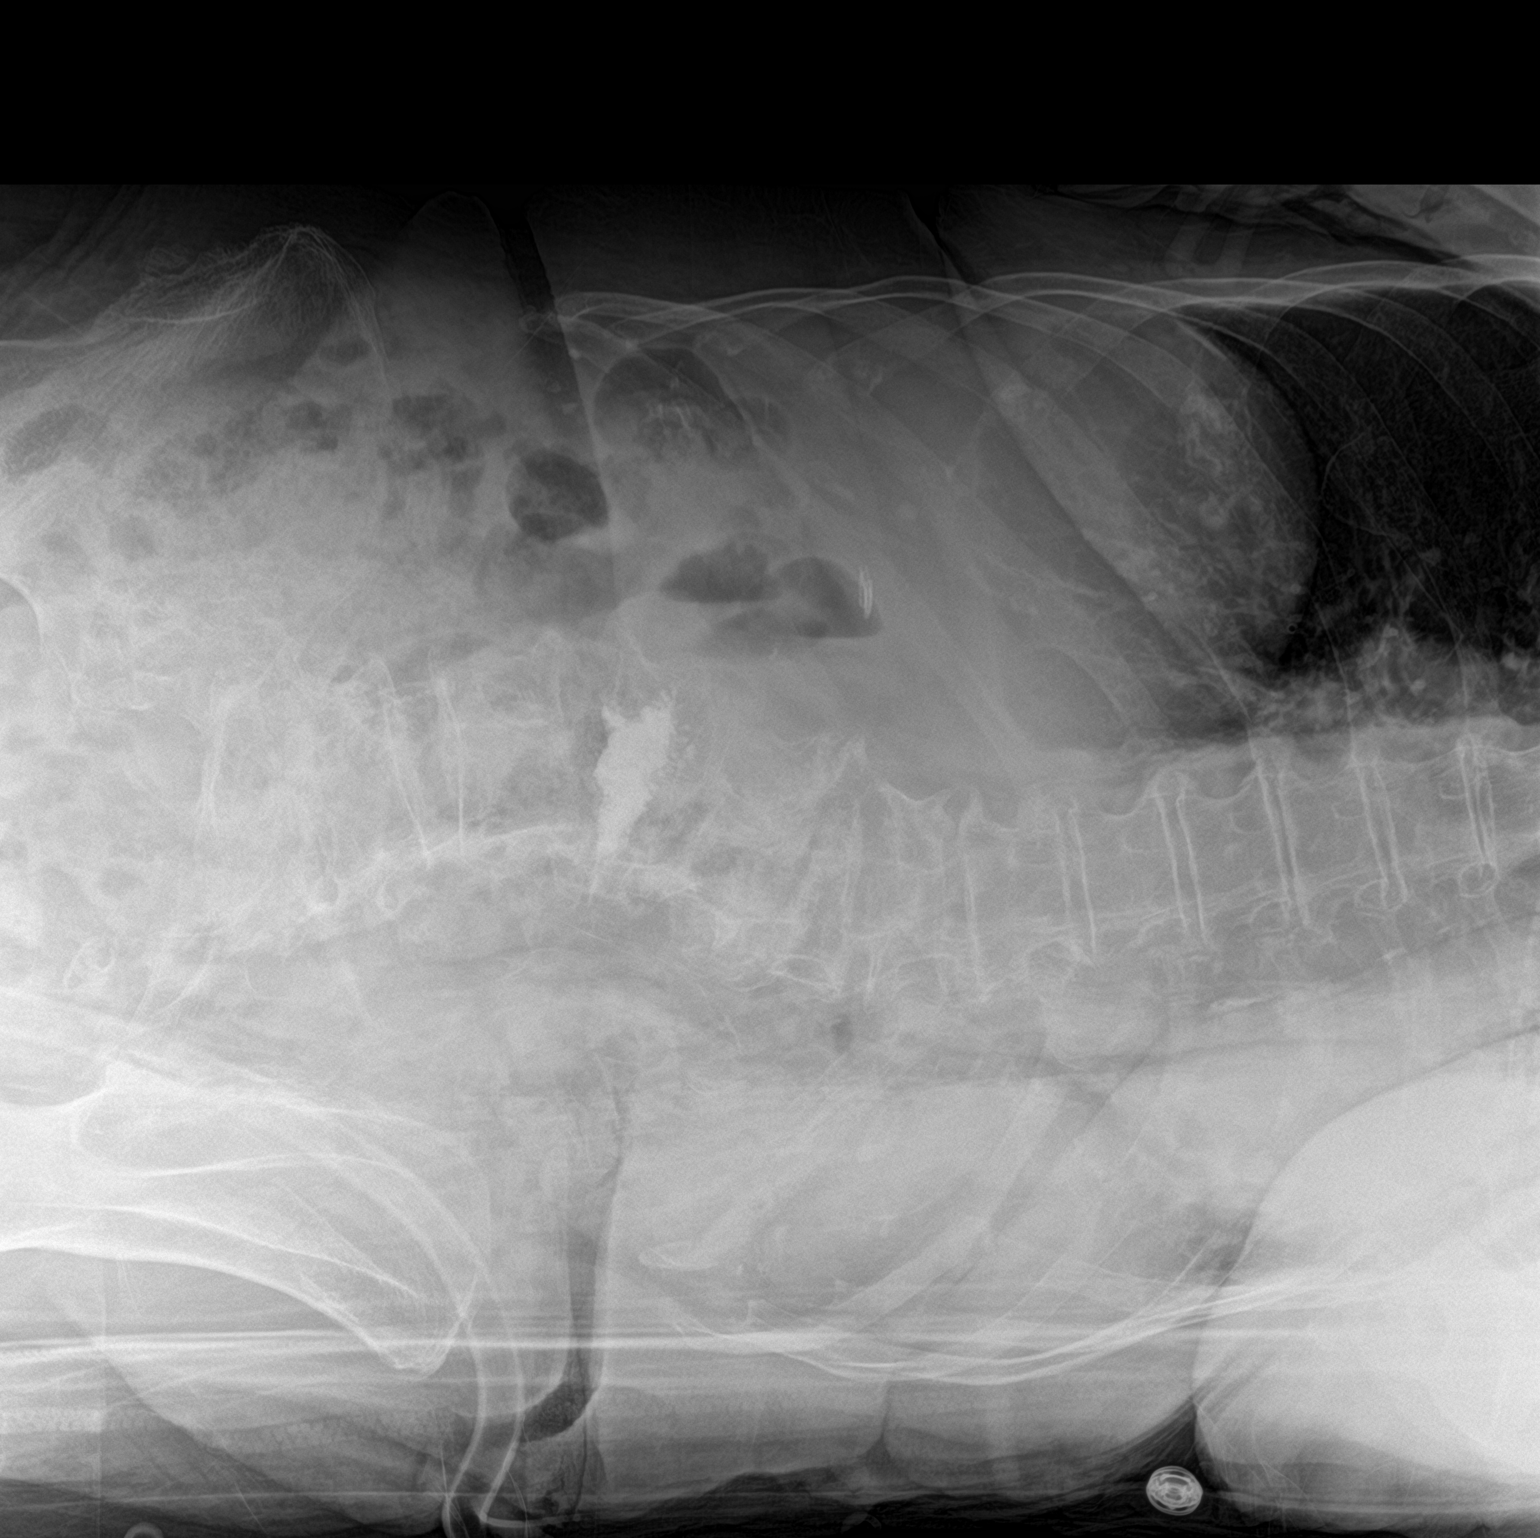

[2 of 2 positions shown; findings below may reference images not displayed]

FINDINGS: No visible biliary stent. Formed stool throughout most of the colon.
No small bowel dilatation.

Advanced spinal degeneration with scoliosis. L3 cement augmentation.
Cholecystectomy clips.
IMPRESSION: 1. No visible biliary stent.
2. Stool retention.

## 2019-08-26 ENCOUNTER — Ambulatory Visit (INDEPENDENT_AMBULATORY_CARE_PROVIDER_SITE_OTHER): Payer: Medicare Other | Admitting: Sports Medicine

## 2019-08-26 ENCOUNTER — Encounter: Payer: Self-pay | Admitting: Sports Medicine

## 2019-08-26 ENCOUNTER — Other Ambulatory Visit: Payer: Self-pay

## 2019-08-26 DIAGNOSIS — B351 Tinea unguium: Secondary | ICD-10-CM | POA: Diagnosis not present

## 2019-08-26 DIAGNOSIS — E1142 Type 2 diabetes mellitus with diabetic polyneuropathy: Secondary | ICD-10-CM

## 2019-08-26 DIAGNOSIS — M79675 Pain in left toe(s): Secondary | ICD-10-CM | POA: Diagnosis not present

## 2019-08-26 DIAGNOSIS — E1151 Type 2 diabetes mellitus with diabetic peripheral angiopathy without gangrene: Secondary | ICD-10-CM | POA: Diagnosis not present

## 2019-08-26 DIAGNOSIS — M79674 Pain in right toe(s): Secondary | ICD-10-CM

## 2019-08-26 NOTE — Patient Instructions (Signed)
Voltaren gel OTC to heels for pain

## 2019-08-26 NOTE — Progress Notes (Signed)
Subjective: Heidi Dominguez is a 86 y.o. female patient with history of diabetes who returns to office today complaining of long, painful nails  while ambulating in shoes; unable to trim. Reports that she lost her ote cushions and still has some pain in heels. Patient states that the glucose is doing fine. Patient denies any other pedal concerns at this time.   Patient Active Problem List   Diagnosis Date Noted  . Infection or inflammatory reaction due to internal joint prosthesis ([place]) 02/20/2019  . Rhinitis, chronic 01/07/2019  . Infection of shoulder ([place]) 11/08/2018  . History of hemiarthroplasty of right shoulder 09/25/2018  . UTI (urinary tract infection) 09/12/2018  . Acute pancreatitis 08/28/2018  . Acute recurrent pancreatitis   . Abdominal pain 08/23/2018  . Elevated LFTs 07/04/2018  . CKD (chronic kidney disease) stage 3, GFR 30-59 ml/min 07/04/2018  . Upper airway cough syndrome 03/13/2018  . Sensorineural hearing loss (SNHL), bilateral 12/04/2016  . Post-nasal drainage 03/14/2016  . Presbycusis of both ears 03/14/2016  . Bilateral hearing loss 10/06/2015  . Bilateral impacted cerumen 10/06/2015  . 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 Subjective tinnitus of both ears 10/06/2015  . Choledocholithiasis with chronic cholecystitis, nonobstructing 04/11/2015  . Pre-operative cardiovascular examination, high risk surgery   . Epigastric abdominal pain 04/08/2015  . Acute gallstone pancreatitis s/p lap chole w [facility] 04/11/2015 04/08/2015  . Diabetes mellitus type 2, controlled ([place]) 04/08/2015  . Gallstone pancreatitis 04/08/2015  . Mitral regurgitation 04/08/2015  . Chronic diastolic CHF (congestive heart failure) ([place]) 04/08/2015  . DOE (dyspnea on exertion) 03/31/2015  . Ischemic colitis ([place]) 08/09/2012  . Colitis 08/07/2012  . Rectal bleeding 08/07/2012  . Weakness 09/24/2011  . Bradycardia 09/24/2011  . Acute lower UTI 09/24/2011  . Hematochezia  09/24/2011  . Essential hypertension 09/24/2011  . Dyslipidemia 09/24/2011   Current Outpatient Medications on File Prior to Visit  Medication Sig Dispense Refill  . albuterol (PROVENTIL HFA;VENTOLIN HFA) 108 (90 Base) MCG/ACT inhaler Inhale into the lungs every 6 (six) hours as needed for wheezing or shortness of breath.    Marland Kitchen amLODipine (NORVASC) 10 MG tablet TK 1 T PO QD    . aspirin EC 81 MG tablet Take 81 mg by mouth daily.    . Cholecalciferol (VITAMIN D-3) 125 MCG (5000 UT) TABS Take 5,000 Units by mouth daily.    . famotidine (PEPCID) 20 MG tablet Take one after  supper    . fentaNYL (DURAGESIC) 25 MCG/HR APP 1 PA EXT TO THE SKIN Q 72 H    . Ferrous Gluconate (IRON) 240 (27 FE) MG TABS Take 240 mg by mouth daily.     . furosemide (LASIX) 20 MG tablet furosemide 20 mg tablet    . JANUVIA 50 MG tablet Take 50 mg by mouth daily.   5  . levalbuterol (XOPENEX) 1.25 MG/3ML nebulizer solution 1 vial in neb every 4 hours as needed    . losartan (COZAAR) 50 MG tablet Take 1 tablet (50 mg total) by mouth daily. 30 tablet 11  . pantoprazole (PROTONIX) 40 MG tablet Take 1 tablet (40 mg total) by mouth daily. Take 30-60 min before first meal of the day 30 tablet 5  . potassium chloride (K-DUR) 10 MEQ tablet TK 1 T PO  ONCE A DAY    . vitamin B-12 (CYANOCOBALAMIN) 1000 MCG tablet Take 1,000 mcg by mouth daily.     No current facility-administered medications on file prior to  visit.   Allergies  Allergen Reactions  . Catapres [Clonidine Hcl] Other (See Comments)    Unknown. Unknown.  . Codeine Other (See Comments)    "Makes me out of my head, loopy"  . Covera-Hs [Verapamil Hcl] Other (See Comments)    Unknown.  Marland Kitchen Hydrocodone Other (See Comments)    "makes me go out of my head, loopy"  . Other Other (See Comments)    Any type of narcotics Feels "loopy" Any type of narcotics Feels "loopy"  . Sulfa Antibiotics Itching  . Sulfamethoxazole Diarrhea  . Verapamil Diarrhea and Other (See  Comments)  . Sulfasalazine Itching    No results found for this or any previous visit (from the past 2160 hour(s)).  Objective: General: Patient is awake, alert, and oriented x 3 and in no acute distress.  Integument: Skin is warm, dry and supple bilateral. Nails are tender, long, thickened and dystrophic with subungual debris, consistent with onychomycosis, 1-5 bilateral. No signs of infection. No open lesions. + minimql reactive keratosis over right distal 1st toe and left bunion with minimal heme without ulceration, pre-ulcerative in nature. Remaining integument unremarkable.  Vasculature:  Dorsalis Pedis pulse 1/4 bilateral. Posterior Tibial pulse  0/4 bilateral. Capillary fill time <3 sec 1-5 bilateral. No hair growth to the level of the digits.Temperature gradient within normal limits. + varicosities present bilateral. Trace edema present bilateral.   Neurology: The patient has intact sensation measured with a 5.07/10g Semmes Weinstein Monofilament at all pedal sites bilateral . Vibratory sensation diminished bilateral with tuning fork. No Babinski sign present bilateral.   Musculoskeletal: + Bunion, hammertoes, pes planus symptomatic pedal deformities noted bilateral. Muscular strength 4/5 in all lower extremity muscular groups bilateral without pain on range of motion. No tenderness with calf compression bilateral however there is mild tenderness to anterior shins and ankles that is diffuse as previous.   Assessment and Plan: Problem List Items Addressed This Visit    None    Visit Diagnoses    Pain due to onychomycosis of toenails of both feet    -  Primary   Diabetic peripheral neuropathy associated with type 2 diabetes mellitus (HCC)       Type II diabetes mellitus with peripheral circulatory disorder ([place])         -Examined patient. -Re-discussed and educated patient on diabetic foot care, especially with regards to the vascular, neurological and musculoskeletal systems.   -Mechanically debrided nails x 10 using sterile nail nipper without incident  -Continue with bunion and hammertoe padding for left as needed; dispensed new tube foam  -Continue with Diabetic shoes and recommend voltaren as needed for pain  -Patient to return for at risk foot care in 3 months  -Patient advised to call the office if any problems or questions arise in the meantime.  Landis Martins, DPM

## 2019-09-02 DIAGNOSIS — J42 Unspecified chronic bronchitis: Secondary | ICD-10-CM | POA: Insufficient documentation

## 2019-09-02 NOTE — Assessment & Plan Note (Signed)
08/07/2019  Trial of nebulized laba up to every 4 hours as needed as can't use hfa effectively

## 2019-09-15 ENCOUNTER — Ambulatory Visit (INDEPENDENT_AMBULATORY_CARE_PROVIDER_SITE_OTHER): Payer: Medicare Other | Admitting: Adult Health

## 2019-09-15 ENCOUNTER — Ambulatory Visit: Payer: Medicare Other | Admitting: Internal Medicine

## 2019-09-15 ENCOUNTER — Encounter: Payer: Self-pay | Admitting: Adult Health

## 2019-09-15 ENCOUNTER — Other Ambulatory Visit: Payer: Self-pay

## 2019-09-15 DIAGNOSIS — R058 Other specified cough: Secondary | ICD-10-CM

## 2019-09-15 DIAGNOSIS — R0982 Postnasal drip: Secondary | ICD-10-CM

## 2019-09-15 DIAGNOSIS — R05 Cough: Secondary | ICD-10-CM

## 2019-09-15 DIAGNOSIS — I10 Essential (primary) hypertension: Secondary | ICD-10-CM

## 2019-09-15 NOTE — Progress Notes (Signed)
@Patient  ID: Heidi Dominguez, female    DOB: [birth year], 86 y.o.   MRN: [id number]  Chief Complaint  Patient presents with  . Follow-up    Cough     Referring provider: Merlene Laughter, MD  HPI: 86 year old female never smoker followed for asthma ACE inhibitor cough  TEST/EVENTS :   09/15/19.  Kidney is Heidi Dominguez follow up : Asthma Patient returns for a 82-month follow-up.  Patient was seen last visit for recurrent cough for 6 months.  She was recommended to discontinue her lisinopril.  She was started on losartan 50 mg for blood pressure control.  Since last visit patient says she is doing much better.  Her cough is substantially decreased.  She continues to have some nasal congestion and drainage especially in the back of her throat.  But overall feels that she has improved.  She denies any chest pain orthopnea PND or increased leg swelling .    Allergies  Allergen Reactions  . Catapres [Clonidine Hcl] Other (See Comments)    Unknown. Unknown.  . Codeine Other (See Comments)    "Makes me out of my head, loopy"  . Covera-Hs [Verapamil Hcl] Other (See Comments)    Unknown.  Marland Kitchen Hydrocodone Other (See Comments)    "makes me go out of my head, loopy"  . Other Other (See Comments)    Any type of narcotics Feels "loopy" Any type of narcotics Feels "loopy"  . Sulfa Antibiotics Itching  . Sulfamethoxazole Diarrhea  . Verapamil Diarrhea and Other (See Comments)  . Sulfasalazine Itching    Immunization History  Administered Date(s) Administered  . Influenza-Unspecified 07/31/2019  . PPD Test 04/13/2015    Past Medical History:  Diagnosis Date  . Acute pancreatitis    hx of   . Arthritis    "qwhere" (07/04/2018)  . Chronic back pain    "all over" (07/04/2018)  . Chronic diastolic CHF (congestive heart failure) (HCC)   . Chronic kidney disease    stage  3 chronic kidney disease   . Complication of anesthesia    "I have a hard time waking up"  . Degenerative joint disease  of shoulder region   . Dry eye syndrome   . Dyspnea   . Family history of adverse reaction to anesthesia    "daughter has the shakes when she wakes up"  . GERD (gastroesophageal reflux disease)   . Heart murmur   . High cholesterol   . History of hiatal hernia   . HOH (hard of hearing)   . Hypertension   . Impacted cerumen of both ears    hx of   . Muscle weakness (generalized)   . Osteoarthritis   . Overactive bladder   . Pancreatitis    hx of   . Phlebitis    "BLE"  . Rotator cuff tear    right   . Tear of right supraspinatus tendon   . Thrombocytopenia (HCC)    hx of   . Type II diabetes mellitus (HCC)   . Urinary tract infection    hx of   . Xerosis cutis    hx of     Tobacco History: Social History   Tobacco Use  Smoking Status Never Smoker  Smokeless Tobacco Never Used   Counseling given: Not Answered   Outpatient Medications Prior to Visit  Medication Sig Dispense Refill  . albuterol (PROVENTIL HFA;VENTOLIN HFA) 108 (90 Base) MCG/ACT inhaler Inhale into the lungs every 6 (six) hours as needed for  wheezing or shortness of breath.    Marland Kitchen amLODipine (NORVASC) 10 MG tablet TK 1 T PO QD    . aspirin EC 81 MG tablet Take 81 mg by mouth daily.    . Cholecalciferol (VITAMIN D-3) 125 MCG (5000 UT) TABS Take 5,000 Units by mouth daily.    . famotidine (PEPCID) 20 MG tablet Take one after  supper    . fentaNYL (DURAGESIC) 25 MCG/HR APP 1 PA EXT TO THE SKIN Q 72 H    . Ferrous Gluconate (IRON) 240 (27 FE) MG TABS Take 240 mg by mouth daily.     . furosemide (LASIX) 20 MG tablet furosemide 20 mg tablet    . JANUVIA 50 MG tablet Take 50 mg by mouth daily.   5  . levalbuterol (XOPENEX) 1.25 MG/3ML nebulizer solution 1 vial in neb every 4 hours as needed    . losartan (COZAAR) 50 MG tablet Take 1 tablet (50 mg total) by mouth daily. 30 tablet 11  . pantoprazole (PROTONIX) 40 MG tablet Take 1 tablet (40 mg total) by mouth daily. Take 30-60 min before first meal of the day  30 tablet 5  . potassium chloride (K-DUR) 10 MEQ tablet TK 1 T PO  ONCE A DAY    . vitamin B-12 (CYANOCOBALAMIN) 1000 MCG tablet Take 1,000 mcg by mouth daily.     No facility-administered medications prior to visit.     Review of Systems:   Constitutional:   No  weight loss, night sweats,  Fevers, chills,  +fatigue, or  lassitude.  HEENT:   No headaches,  Difficulty swallowing,  Tooth/dental problems, or  Sore throat,                No sneezing, itching, ear ache,  +nasal congestion, post nasal drip,   CV:  No chest pain,  Orthopnea, PND, swelling in lower extremities, anasarca, dizziness, palpitations, syncope.   GI  No heartburn, indigestion, abdominal pain, nausea, vomiting, diarrhea, change in bowel habits, loss of appetite, bloody stools.   Resp:    No chest wall deformity  Skin: no rash or lesions.  GU: no dysuria, change in color of urine, no urgency or frequency.  No flank pain, no hematuria   MS:  No joint pain or swelling.  No decreased range of motion.  No back pain.    Physical Exam  BP (!) 142/78 (BP Location: Right Arm, Cuff Size: Normal)   Pulse 80   Temp 97.6 F (36.4 C) (Temporal)   Ht 5\' 4"  (1.626 m)   Wt 167 lb (75.8 kg)   SpO2 96% Comment: RA  BMI 28.67 kg/m   GEN: A/Ox3; pleasant , NAD, elderly appears younger than stated age   HEENT:  [place]/AT,  NOSE-clear, THROAT-clear, no lesions, no postnasal drip or exudate noted.   NECK:  Supple w/ fair ROM; no JVD; normal carotid impulses w/o bruits; no thyromegaly or nodules palpated; no lymphadenopathy.    RESP  Clear  P & A; w/o, wheezes/ rales/ or rhonchi. no accessory muscle use, no dullness to percussion  CARD:  RRR, no m/r/g, no peripheral edema, pulses intact, no cyanosis or clubbing.  GI:   Soft & nt; nml bowel sounds; no organomegaly or masses detected.   Musco: Warm bil, no deformities or joint swelling noted.   Neuro: alert, no focal deficits noted.    Skin: Warm, no lesions or  rashes    Lab Results:  CBC  BNP No results found  for: BNP  ProBNP No results found for: PROBNP  Imaging: No results found.    No flowsheet data found.  No results found for: NITRICOXIDE      Assessment & Plan:   Upper airway cough syndrome Much improved off of ACE inhibitor .  Continue on cough control and trigger prevention.  Plan  Patient Instructions  Saline nasal spray Twice daily  .  AYR Saline Nasal gel At bedtime   Delsym 2 tsp Twice daily  As needed  Cough .  Follow up with Dr. Pete Glatter for blood pressure going forward .  Would avoid ACE Inhibitor (Lisinopril ) going forward if possible.  Follow up with Dr. Sherene Sires  In 3 months and As needed           Post-nasal drainage Add saline nasal spray and saline nasal gel as needed  Essential hypertension Patient changed from lisinopril to losartan.  Due to ongoing cough.  Blood pressure is controlled on current regimen.  Advised to follow-up with primary care for provider going forward.  For blood pressure management Would avoid ACE inhibitor's if possible in the future     Rubye Oaks, NP  09/15/19

## 2019-09-15 NOTE — Patient Instructions (Addendum)
Saline nasal spray Twice daily  .  AYR Saline Nasal gel At bedtime   Delsym 2 tsp Twice daily  As needed  Cough .  Follow up with Dr. Felipa Eth for blood pressure going forward .  Would avoid ACE Inhibitor (Lisinopril ) going forward if possible.  Follow up with Dr. Melvyn Novas  In 3 months and As needed

## 2019-09-16 NOTE — Assessment & Plan Note (Signed)
Patient changed from lisinopril to losartan.  Due to ongoing cough.  Blood pressure is controlled on current regimen.  Advised to follow-up with primary care for provider going forward.  For blood pressure management Would avoid ACE inhibitor's if possible in the future

## 2019-09-16 NOTE — Assessment & Plan Note (Signed)
Much improved off of ACE inhibitor .  Continue on cough control and trigger prevention.  Plan  Patient Instructions  Saline nasal spray Twice daily  .  AYR Saline Nasal gel At bedtime   Delsym 2 tsp Twice daily  As needed  Cough .  Follow up with Dr. Felipa Eth for blood pressure going forward .  Would avoid ACE Inhibitor (Lisinopril ) going forward if possible.  Follow up with Dr. Melvyn Novas  In 3 months and As needed

## 2019-09-16 NOTE — Assessment & Plan Note (Signed)
Add saline nasal spray and saline nasal gel as needed

## 2019-10-05 ENCOUNTER — Ambulatory Visit: Payer: Medicare Other | Attending: Internal Medicine

## 2019-10-05 DIAGNOSIS — Z23 Encounter for immunization: Secondary | ICD-10-CM | POA: Insufficient documentation

## 2019-10-05 NOTE — Progress Notes (Signed)
   Covid-19 Vaccination Clinic  Name:  Heidi Dominguez    MRN: [id number] DOB: April 18, 1921  10/05/2019  Heidi Dominguez was observed post Covid-19 immunization for 15 minutes without incidence. She was provided with Vaccine Information Sheet and instruction to access the V-Safe system.   Heidi Dominguez was instructed to call 911 with any severe reactions post vaccine: Marland Kitchen Difficulty breathing  . Swelling of your face and throat  . A fast heartbeat  . A bad rash all over your body  . Dizziness and weakness    Immunizations Administered    Name Date Dose VIS Date Route   Pfizer COVID-19 Vaccine 10/05/2019  2:47 PM 0.3 mL 07/25/2019 Intramuscular   Manufacturer: [place]   Lot: [id number]   [place]: [id number]

## 2019-10-22 DIAGNOSIS — K219 Gastro-esophageal reflux disease without esophagitis: Secondary | ICD-10-CM | POA: Diagnosis not present

## 2019-10-22 DIAGNOSIS — R0982 Postnasal drip: Secondary | ICD-10-CM | POA: Diagnosis not present

## 2019-10-22 DIAGNOSIS — H9113 Presbycusis, bilateral: Secondary | ICD-10-CM | POA: Diagnosis not present

## 2019-10-29 ENCOUNTER — Ambulatory Visit: Payer: Medicare Other | Attending: Internal Medicine

## 2019-10-29 DIAGNOSIS — Z23 Encounter for immunization: Secondary | ICD-10-CM

## 2019-10-29 NOTE — Progress Notes (Signed)
   Covid-19 Vaccination Clinic  Name:  Heidi Dominguez    MRN: [id number] DOB: 10/22/1920  10/29/2019  [name] was observed post Covid-19 immunization for 15 minutes without incident. She was provided with Vaccine Information Sheet and instruction to access the V-Safe system.   [name] was instructed to call 911 with any severe reactions post vaccine: Marland Kitchen Difficulty breathing  . Swelling of face and throat  . A fast heartbeat  . A bad rash all over body  . Dizziness and weakness   Immunizations Administered    Name Date Dose VIS Date Route   Pfizer COVID-19 Vaccine 10/29/2019  4:47 PM 0.3 mL 07/25/2019 Intramuscular   Manufacturer: [place]   Lot: [id number]   [place]: [id number]

## 2019-10-30 ENCOUNTER — Telehealth: Payer: Self-pay | Admitting: Internal Medicine

## 2019-10-30 NOTE — Telephone Encounter (Signed)
LMTCB for pt's daughhter

## 2019-11-02 ENCOUNTER — Emergency Department (HOSPITAL_COMMUNITY): Payer: Medicare Other

## 2019-11-02 ENCOUNTER — Encounter (HOSPITAL_COMMUNITY): Payer: Self-pay

## 2019-11-02 ENCOUNTER — Emergency Department (HOSPITAL_COMMUNITY)
Admission: EM | Admit: 2019-11-02 | Discharge: 2019-11-03 | Disposition: A | Discharge status: Home / Self Care | Payer: Medicare Other | Attending: Emergency Medicine | Admitting: Emergency Medicine

## 2019-11-02 ENCOUNTER — Other Ambulatory Visit: Payer: Self-pay

## 2019-11-02 DIAGNOSIS — R05 Cough: Secondary | ICD-10-CM | POA: Diagnosis not present

## 2019-11-02 DIAGNOSIS — I440 Atrioventricular block, first degree: Secondary | ICD-10-CM | POA: Diagnosis not present

## 2019-11-02 DIAGNOSIS — N183 Chronic kidney disease, stage 3 unspecified: Secondary | ICD-10-CM | POA: Diagnosis not present

## 2019-11-02 DIAGNOSIS — Z79899 Other long term (current) drug therapy: Secondary | ICD-10-CM | POA: Insufficient documentation

## 2019-11-02 DIAGNOSIS — R42 Dizziness and giddiness: Secondary | ICD-10-CM | POA: Diagnosis not present

## 2019-11-02 DIAGNOSIS — R0981 Nasal congestion: Secondary | ICD-10-CM | POA: Diagnosis not present

## 2019-11-02 DIAGNOSIS — Z7982 Long term (current) use of aspirin: Secondary | ICD-10-CM | POA: Diagnosis not present

## 2019-11-02 DIAGNOSIS — N3000 Acute cystitis without hematuria: Secondary | ICD-10-CM

## 2019-11-02 DIAGNOSIS — R404 Transient alteration of awareness: Secondary | ICD-10-CM | POA: Diagnosis not present

## 2019-11-02 DIAGNOSIS — I5032 Chronic diastolic (congestive) heart failure: Secondary | ICD-10-CM | POA: Diagnosis not present

## 2019-11-02 DIAGNOSIS — G9389 Other specified disorders of brain: Secondary | ICD-10-CM | POA: Diagnosis not present

## 2019-11-02 DIAGNOSIS — Z96652 Presence of left artificial knee joint: Secondary | ICD-10-CM | POA: Diagnosis not present

## 2019-11-02 DIAGNOSIS — I10 Essential (primary) hypertension: Secondary | ICD-10-CM | POA: Diagnosis not present

## 2019-11-02 DIAGNOSIS — I130 Hypertensive heart and chronic kidney disease with heart failure and stage 1 through stage 4 chronic kidney disease, or unspecified chronic kidney disease: Secondary | ICD-10-CM | POA: Diagnosis not present

## 2019-11-02 DIAGNOSIS — Z7984 Long term (current) use of oral hypoglycemic drugs: Secondary | ICD-10-CM | POA: Diagnosis not present

## 2019-11-02 DIAGNOSIS — R4020 Unspecified coma: Secondary | ICD-10-CM | POA: Diagnosis not present

## 2019-11-02 DIAGNOSIS — Z96651 Presence of right artificial knee joint: Secondary | ICD-10-CM | POA: Diagnosis not present

## 2019-11-02 DIAGNOSIS — R4182 Altered mental status, unspecified: Secondary | ICD-10-CM | POA: Diagnosis present

## 2019-11-02 DIAGNOSIS — E1122 Type 2 diabetes mellitus with diabetic chronic kidney disease: Secondary | ICD-10-CM | POA: Diagnosis not present

## 2019-11-02 DIAGNOSIS — R110 Nausea: Secondary | ICD-10-CM | POA: Diagnosis not present

## 2019-11-02 DIAGNOSIS — R55 Syncope and collapse: Secondary | ICD-10-CM | POA: Diagnosis not present

## 2019-11-02 LAB — CBC WITH DIFFERENTIAL/PLATELET
Abs Immature Granulocytes: 0.05 10*3/uL (ref 0.00–0.07)
Basophils Absolute: 0 10*3/uL (ref 0.0–0.1)
Basophils Relative: 0 %
Eosinophils Absolute: 0.2 10*3/uL (ref 0.0–0.5)
Eosinophils Relative: 2 %
HCT: 48 % — ABNORMAL HIGH (ref 36.0–46.0)
Hemoglobin: 14.8 g/dL (ref 12.0–15.0)
Immature Granulocytes: 1 %
Lymphocytes Relative: 21 %
Lymphs Abs: 1.6 10*3/uL (ref 0.7–4.0)
MCH: 28.7 pg (ref 26.0–34.0)
MCHC: 30.8 g/dL (ref 30.0–36.0)
MCV: 93.2 fL (ref 80.0–100.0)
Monocytes Absolute: 0.8 10*3/uL (ref 0.1–1.0)
Monocytes Relative: 11 %
Neutro Abs: 5.1 10*3/uL (ref 1.7–7.7)
Neutrophils Relative %: 65 %
Platelets: 170 10*3/uL (ref 150–400)
RBC: 5.15 MIL/uL — ABNORMAL HIGH (ref 3.87–5.11)
RDW: 14.1 % (ref 11.5–15.5)
WBC: 7.8 10*3/uL (ref 4.0–10.5)
nRBC: 0 % (ref 0.0–0.2)

## 2019-11-02 LAB — COMPREHENSIVE METABOLIC PANEL
ALT: 12 U/L (ref 0–44)
AST: 22 U/L (ref 15–41)
Albumin: 3 g/dL — ABNORMAL LOW (ref 3.5–5.0)
Alkaline Phosphatase: 98 U/L (ref 38–126)
Anion gap: 11 (ref 5–15)
BUN: 20 mg/dL (ref 8–23)
CO2: 24 mmol/L (ref 22–32)
Calcium: 9.1 mg/dL (ref 8.9–10.3)
Chloride: 101 mmol/L (ref 98–111)
Creatinine, Ser: 0.86 mg/dL (ref 0.44–1.00)
GFR calc Af Amer: >60 mL/min (ref >60)
GFR calc non Af Amer: 57 mL/min — ABNORMAL LOW (ref >60)
Glucose, Bld: 231 mg/dL — ABNORMAL HIGH (ref 70–99)
Potassium: 4 mmol/L (ref 3.5–5.1)
Sodium: 136 mmol/L (ref 135–145)
Total Bilirubin: 0.6 mg/dL (ref 0.3–1.2)
Total Protein: 7 g/dL (ref 6.5–8.1)

## 2019-11-02 LAB — CBG MONITORING, ED: Glucose-Capillary: 205 mg/dL — ABNORMAL HIGH (ref 70–99)

## 2019-11-02 LAB — TROPONIN I (HIGH SENSITIVITY): Troponin I (High Sensitivity): 4 ng/L (ref <18)

## 2019-11-02 NOTE — ED Provider Notes (Signed)
[facility] EMERGENCY DEPARTMENT Provider Note   CSN: [id number] Arrival date & time: 11/02/19  1944     History No chief complaint on file.   Heidi Dominguez is a 86 y.o. female resenting for evaluation of altered mental status.  History provided mostly by patient's daughter.  Daughter states patient was with her caregiver.  Patient was on the toilet when the caregiver went to check on her, she was not responding at her baseline and her tongue was hanging out.  This lasted for several minutes, before patient returned back to baseline mental status.  Since patient and daughter have been together, patient has been at baseline mental status.  Patient states she was straining on the toilet today when she had her episode of altered mentation.  Patient denies recent fevers, chills, headache, dizziness, lightheadedness, chest pain, shortness of breath, nausea, vomiting, abd pain, urinary symptoms, normal bowel movements.  Patient states over the past several days she has had nasal congestion and a mildly phlegm producing cough.  Additional history obtained per chart review.  Patient with a history of CHF, CKD, GERD, hypertension, hyperlipidemia, diabetes, COPD, chronic shoulder infection status post repair  HPI     Past Medical History:  Diagnosis Date  . Acute pancreatitis    hx of   . Arthritis    "qwhere" (07/04/2018)  . Chronic back pain    "all over" (07/04/2018)  . Chronic diastolic CHF (congestive heart failure) ([place])   . Chronic kidney disease    stage  3 chronic kidney disease   . Complication of anesthesia    "I have a hard time waking up"  . Degenerative joint disease of shoulder region   . Dry eye syndrome   . Dyspnea   . Family history of adverse reaction to anesthesia    "daughter has the shakes when she wakes up"  . GERD (gastroesophageal reflux disease)   . Heart murmur   . High cholesterol   . History of hiatal hernia   . HOH (hard of hearing)    . Hypertension   . Impacted cerumen of both ears    hx of   . Muscle weakness (generalized)   . Osteoarthritis   . Overactive bladder   . Pancreatitis    hx of   . Phlebitis    "BLE"  . Rotator cuff tear    right   . Tear of right supraspinatus tendon   . Thrombocytopenia (HCC)    hx of   . Type II diabetes mellitus ([place])   . Urinary tract infection    hx of   . Xerosis cutis    hx of     Patient Active Problem List   Diagnosis Date Noted  . Unspecified chronic bronchitis ([place]) 09/02/2019  . Infection or inflammatory reaction due to internal joint prosthesis ([place]) 02/20/2019  . Rhinitis, chronic 01/07/2019  . Infection of shoulder ([place]) 11/08/2018  . History of hemiarthroplasty of right shoulder 09/25/2018  . UTI (urinary tract infection) 09/12/2018  . Acute pancreatitis 08/28/2018  . Acute recurrent pancreatitis   . Abdominal pain 08/23/2018  . Elevated LFTs 07/04/2018  . CKD (chronic kidney disease) stage 3, GFR 30-59 ml/min 07/04/2018  . Upper airway cough syndrome 03/13/2018  . Sensorineural hearing loss (SNHL), bilateral 12/04/2016  . Post-nasal drainage 03/14/2016  . Presbycusis of both ears 03/14/2016  . Bilateral hearing loss 10/06/2015  . Bilateral impacted cerumen 10/06/2015  . Neoplasm of uncertain behavior of pharynx  10/06/2015  . Subjective tinnitus of both ears 10/06/2015  . Choledocholithiasis with chronic cholecystitis, nonobstructing 04/11/2015  . Pre-operative cardiovascular examination, high risk surgery   . Epigastric abdominal pain 04/08/2015  . Acute gallstone pancreatitis s/p lap chole w [facility] 04/11/2015 04/08/2015  . Diabetes mellitus type 2, controlled ([place]) 04/08/2015  . Gallstone pancreatitis 04/08/2015  . Mitral regurgitation 04/08/2015  . Chronic diastolic CHF (congestive heart failure) ([place]) 04/08/2015  . DOE (dyspnea on exertion) 03/31/2015  . Ischemic colitis ([place]) 08/09/2012  . Colitis 08/07/2012  . Rectal bleeding 08/07/2012  .  Weakness 09/24/2011  . Bradycardia 09/24/2011  . Acute lower UTI 09/24/2011  . Hematochezia 09/24/2011  . Essential hypertension 09/24/2011  . Dyslipidemia 09/24/2011    Past Surgical History:  Procedure Laterality Date  . ABDOMINAL HYSTERECTOMY    . BACK SURGERY    . CATARACT EXTRACTION W/ INTRAOCULAR LENS  IMPLANT, BILATERAL Bilateral   . CHOLECYSTECTOMY  2016  . ERCP N/A 08/30/2018   Procedure: ENDOSCOPIC RETROGRADE CHOLANGIOPANCREATOGRAPHY (ERCP);  Surgeon: Clarene Essex, MD;  Location: [facility];  Service: Endoscopy;  Laterality: N/A;  . FIXATION KYPHOPLASTY    . INCISION AND DRAINAGE Right 11/08/2018   Procedure: INCISION AND DRAINAGE right shoulder, placement of antibiotic beads ;  Surgeon: [name], MD;  Location: [facility];  Service: Orthopedics;  Laterality: Right;  . JOINT REPLACEMENT    . LAPAROSCOPIC CHOLECYSTECTOMY SINGLE SITE WITH INTRAOPERATIVE CHOLANGIOGRAM N/A 04/11/2015   Procedure: LAPAROSCOPIC LYSIS OF ADHESIONS, LAPAROSCOPIC CHOLECYSTECTOMY WITH INTRAOPERATIVE CHOLANGIOGRAM;  Surgeon: Michael Boston, MD;  Location: [facility];  Service: General;  Laterality: N/A;  . PANCREATIC STENT PLACEMENT  08/30/2018   Procedure: PANCREATIC STENT PLACEMENT;  Surgeon: Clarene Essex, MD;  Location: [facility];  Service: Endoscopy;;  . REMOVAL OF STONES  08/30/2018   Procedure: REMOVAL OF STONES;  Surgeon: Clarene Essex, MD;  Location: [facility];  Service: Endoscopy;;  . SHOULDER OPEN ROTATOR CUFF REPAIR Bilateral   . SPHINCTEROTOMY  08/30/2018   Procedure: SPHINCTEROTOMY;  Surgeon: Clarene Essex, MD;  Location: [facility];  Service: Endoscopy;;  . TOTAL KNEE ARTHROPLASTY Bilateral      OB History   No obstetric history on file.     Family History  Problem Relation Age of Onset  . Hypertension Mother   . Diabetes Mellitus I Mother   . Hypertension Father     Social History   Tobacco Use  . Smoking status: Never Smoker  . Smokeless tobacco: Never Used  Substance Use  Topics  . Alcohol use: No    Comment: rare  . Drug use: No    Home Medications Prior to Admission medications   Medication Sig Start Date End Date Taking? Authorizing Provider  albuterol (PROVENTIL HFA;VENTOLIN HFA) 108 (90 Base) MCG/ACT inhaler Inhale into the lungs every 6 (six) hours as needed for wheezing or shortness of breath.    [provider]  amLODipine (NORVASC) 10 MG tablet TK 1 T PO QD 03/03/19   [provider]  aspirin EC 81 MG tablet Take 81 mg by mouth daily.    [provider]  Cholecalciferol (VITAMIN D-3) 125 MCG (5000 UT) TABS Take 5,000 Units by mouth daily.    [provider]  famotidine (PEPCID) 20 MG tablet Take one after  supper 08/07/19   Tanda Rockers, MD  fentaNYL (DURAGESIC) 25 MCG/HR APP 1 PA EXT TO THE SKIN Q 72 H 02/17/19   [provider]  Ferrous Gluconate (IRON) 240 (27 FE) MG TABS  Take 240 mg by mouth daily.     [provider]  furosemide (LASIX) 20 MG tablet furosemide 20 mg tablet    [provider]  JANUVIA 50 MG tablet Take 50 mg by mouth daily.  03/11/15   [provider]  levalbuterol Penne Lash) 1.25 MG/3ML nebulizer solution 1 vial in neb every 4 hours as needed 08/05/19   [provider]  losartan (COZAAR) 50 MG tablet Take 1 tablet (50 mg total) by mouth daily. 08/07/19   Tanda Rockers, MD  pantoprazole (PROTONIX) 40 MG tablet Take 1 tablet (40 mg total) by mouth daily. Take 30-60 min before first meal of the day 08/07/19   Tanda Rockers, MD  potassium chloride (K-DUR) 10 MEQ tablet TK 1 T PO  ONCE A DAY 03/03/19   [provider]  vitamin B-12 (CYANOCOBALAMIN) 1000 MCG tablet Take 1,000 mcg by mouth daily.    [provider]    Allergies    Catapres [clonidine hcl], Codeine, Covera-hs [verapamil hcl], Hydrocodone, Other, Sulfa antibiotics, Sulfamethoxazole, Verapamil, and Sulfasalazine  Review of Systems   Review of Systems  Neurological:  Positive for syncope (syncope vs presyncope).  All other systems reviewed and are negative.   Physical Exam Updated Vital Signs BP (!) 164/73   Pulse 79   Temp 98 F (36.7 C) (Oral)   Resp 15   Ht 5\' 4"  (1.626 m)   Wt 74.8 kg   SpO2 94%   BMI 28.32 kg/m   Physical Exam Vitals and nursing note reviewed.  Constitutional:      General: She is not in acute distress.    Appearance: She is well-developed.     Comments: Elderly female resting comfortably in the bed in no acute distress  HENT:     Head: Normocephalic and atraumatic.  Eyes:     Conjunctiva/sclera: Conjunctivae normal.     Pupils: Pupils are equal, round, and reactive to light.  Cardiovascular:     Rate and Rhythm: Normal rate and regular rhythm.     Pulses: Normal pulses.     Heart sounds: Murmur present.  Pulmonary:     Effort: Pulmonary effort is normal. No respiratory distress.     Breath sounds: Normal breath sounds. No wheezing.     Comments: Clear lung sounds in all fields.  Speaking in full sentences. Abdominal:     General: There is no distension.     Palpations: Abdomen is soft. There is no mass.     Tenderness: There is no abdominal tenderness. There is no guarding or rebound.  Musculoskeletal:        General: Normal range of motion.     Cervical back: Normal range of motion and neck supple.     Right lower leg: No edema.     Left lower leg: No edema.  Skin:    General: Skin is warm and dry.     Capillary Refill: Capillary refill takes less than 2 seconds.  Neurological:     Mental Status: She is alert and oriented to person, place, and time.     Comments: Alert and oriented.  No obvious neurologic deficits.     ED Results / Procedures / Treatments   Labs (all labs ordered are listed, but only abnormal results are displayed) Labs Reviewed  CBC WITH DIFFERENTIAL/PLATELET - Abnormal; Notable for the following components:      Result Value   RBC 5.15 (*)    HCT 48.0 (*)  All other  components within normal limits  COMPREHENSIVE METABOLIC PANEL - Abnormal; Notable for the following components:   Glucose, Bld 231 (*)    Albumin 3.0 (*)    GFR calc non Af Amer 57 (*)    All other components within normal limits  CBG MONITORING, ED - Abnormal; Notable for the following components:   Glucose-Capillary 205 (*)    All other components within normal limits  URINE CULTURE  URINALYSIS, ROUTINE W REFLEX MICROSCOPIC  TROPONIN I (HIGH SENSITIVITY)  TROPONIN I (HIGH SENSITIVITY)    EKG None  Radiology DG Chest 2 View  Result Date: 11/02/2019 CLINICAL DATA:  Cough and syncope. EXAM: CHEST - 2 VIEW COMPARISON:  September 12, 2018 FINDINGS: The heart size is enlarged. Aortic calcifications are again noted. There is scattered airspace opacities bilaterally favored to represent areas of scarring and atelectasis. There is no pneumothorax. No significant pleural effusion. There is a large hiatal hernia. IMPRESSION: 1. Subtle scattered airspace opacities bilaterally favored to represent areas of scarring and atelectasis. 2. Mild cardiomegaly with vascular congestion. 3. Large hiatal hernia. Electronically Signed   By: Constance Holster M.D.   On: 11/02/2019 20:59   CT Head Wo Contrast  Result Date: 11/02/2019 CLINICAL DATA:  In cephalopathy EXAM: CT HEAD WITHOUT CONTRAST TECHNIQUE: Contiguous axial images were obtained from the base of the skull through the vertex without intravenous contrast. COMPARISON:  None. FINDINGS: Brain: There is atrophy and chronic small vessel disease changes. No acute intracranial abnormality. Specifically, no hemorrhage, hydrocephalus, mass lesion, acute infarction, or significant intracranial injury. Vascular: No hyperdense vessel or unexpected calcification. Skull: No acute calvarial abnormality. Sinuses/Orbits: Visualized paranasal sinuses and mastoids clear. Orbital soft tissues unremarkable. Other: None IMPRESSION: Atrophy, chronic microvascular disease. No  acute intracranial abnormality. Electronically Signed   By: Rolm Baptise M.D.   On: 11/02/2019 20:40    Procedures Procedures (including critical care time)  Medications Ordered in ED Medications - No data to display  ED Course  I have reviewed the triage vital signs and the nursing notes.  Pertinent labs & imaging results that were available during my care of the patient were reviewed by me and considered in my medical decision making (see chart for details).    MDM Rules/Calculators/A&P                      Patient presenting for evaluation of episode of altered mental status.  On exam, patient is at her baseline mental status.  She is alert and oriented.  This occurred while patient was sitting on the toilet and straining, likely vagal episode.  However considering her age, will obtain labs, CT head, x-ray, and EKG.  Labs interpreted by me, overall reassuring.  No leukocytosis.  Electrolytes stable.  Hemoglobin stable.  Initial troponin normal at 4.  EKG without STEMI.  CT head without acute findings.  Chest x-ray viewed interpreted by me, no obvious pneumonia.  Per radiology, mild bilateral opacities favored to be scarring.  Large hiatal hernia.  Case discussed with attending, Dr. Gilford Raid evaluated the patient.   Discussed findings with patient and daughter.  Repeat troponin and urine pending at this time.  Discussed history and work-up at this time, and that patient will likely be discharged.  Significant change in heart rate with orthostatics.  Patient was unsteady on her feet, but not lightheaded or dizziness.  Per patient and daughter, she does have weakness at baseline.  She has a 24/7 living caregiver  who will be able to meet her at the house.   Pt signed out to Donavan Foil, PA-C for f/u on UA and trop. Pt can likely be d/c.   Final Clinical Impression(s) / ED Diagnoses Final diagnoses:  None    Rx / DC Orders ED Discharge Orders    None       Franchot Heidelberg,  PA-C 11/03/19 0024    Isla Pence, MD 11/05/19 [phone]

## 2019-11-02 NOTE — ED Notes (Signed)
Pt is very unsteady with standing (weak). Unable to stand with assistance for more than a few seconds. Provider notified.

## 2019-11-02 NOTE — ED Notes (Signed)
[phone] [name] (daughter)

## 2019-11-02 NOTE — ED Notes (Signed)
CBG Results of 205 reported to Quita Skye, Therapist, sports.

## 2019-11-02 NOTE — ED Triage Notes (Signed)
Pt bib ems, pt having a bowel movement and vagaled. Pt passed out. No fall. Pt ambulated to bedroom. Pt got nauseas when sat up by ems.   Pt aox4 148/86 70hr  18 rr 96%ra

## 2019-11-03 LAB — URINALYSIS, ROUTINE W REFLEX MICROSCOPIC
Bilirubin Urine: NEGATIVE
Glucose, UA: NEGATIVE mg/dL
Hgb urine dipstick: NEGATIVE
Ketones, ur: NEGATIVE mg/dL
Nitrite: NEGATIVE
Protein, ur: NEGATIVE mg/dL
Specific Gravity, Urine: 1.015 (ref 1.005–1.030)
WBC, UA: >50 WBC/hpf — ABNORMAL HIGH (ref 0–5)
pH: 5 (ref 5.0–8.0)

## 2019-11-03 LAB — URINE CULTURE

## 2019-11-03 LAB — TROPONIN I (HIGH SENSITIVITY): Troponin I (High Sensitivity): 7 ng/L (ref <18)

## 2019-11-03 MED ORDER — DOXYCYCLINE HYCLATE 100 MG PO CAPS: 100.0000 mg | ORAL_CAPSULE | Freq: Two times a day (BID) | ORAL | 0 refills | Status: DC

## 2019-11-03 MED ORDER — DOXYCYCLINE HYCLATE 100 MG PO TABS: 100.0000 mg | ORAL_TABLET | Freq: Once | ORAL | Status: DC

## 2019-11-03 NOTE — Telephone Encounter (Signed)
Nothing else to offer, refer back to Dr Felipa Eth with copy of this telephone visit

## 2019-11-03 NOTE — Telephone Encounter (Signed)
Spoke with pt's daughter (dpr on file), states she was seen yesterday in ED for near-syncope, UTI.  Pt also has a prod cough with thick clear mucus that has never improved or resolved since her last OV per daughter.   Per daughter, pt denies fever, chest pain, sinus congestion, loss of taste/smell.  Pt has been taking OTC cough syrup to help with s/s, requesting additional recs.     Pharmacy: [facility] on [address].    MW Please advise on additional recs.  Thanks!

## 2019-11-03 NOTE — Telephone Encounter (Signed)
Called and spoke with pt's daughter [name] letting her know info stated by MW. Stated to her to contact pt's PCP and she verbalized understanding. Routing this phone encounter to pt's PCP Dr. Felipa Eth. Nothing further needed.

## 2019-11-03 NOTE — Telephone Encounter (Signed)
Called and spoke with Patient's Daughter, [name]. Dr Gustavus Bryant recommendations given.  [name] stated Dr. Melvyn Novas  had stated at previous OV's that if nothing was helping Patient, he would order test. [name] refused OV with NP, because it is difficult to bring Patient into office.  [name] also refused telephone visit, because she stated those had not worked in the past.  IT trainer stated Patient was seen in ED yesterday, and prescribed Doxycycline, and diagnosis with UTI. [name] stated at ENT OV, they were told everything was allergy related. [name] stated Patient is throwing up clear, thick phlegm, and never stopped coughing.  Patient has been taking OTC Robitussin for cough.  [name] stated cough is worse at night then day. Advised [name] that Patient would need OV to see what test would be needed, if that is what provider decided.  [name] continued to refuse OV.  Message routed to Dr. Melvyn Novas

## 2019-11-03 NOTE — ED Provider Notes (Signed)
85 year old female received at signout from Heidi Dominguez pending UA and repeat troponin. Per her HPI:   "Heidi Dominguez is a 86 y.o. female resenting for evaluation of altered mental status.  History provided mostly by patient's daughter.  Daughter states patient was with her caregiver.  Patient was on the toilet when the caregiver went to check on her, she was not responding at her baseline and her tongue was hanging out.  This lasted for several minutes, before patient returned back to baseline mental status.  Since patient and daughter have been together, patient has been at baseline mental status.  Patient states she was straining on the toilet today when she had her episode of altered mentation.  Patient denies recent fevers, chills, headache, dizziness, lightheadedness, chest pain, shortness of breath, nausea, vomiting, abd pain, urinary symptoms, normal bowel movements.  Patient states over the past several days she has had nasal congestion and a mildly phlegm producing cough.  Additional history obtained per chart review.  Patient with a history of CHF, CKD, GERD, hypertension, hyperlipidemia, diabetes, COPD, chronic shoulder infection status post repair"  Physical Exam  BP (!) 151/76   Pulse 78   Temp 98 F (36.7 C) (Oral)   Resp 14   Ht 5\' 4"  (1.626 m)   Wt 74.8 kg   SpO2 94%   BMI 28.32 kg/m   Physical Exam Vitals and nursing note reviewed.  Constitutional:      Appearance: She is well-developed.     Comments: Pleasant, elderly female in no acute distress.  Resting comfortably in bed.  HENT:     Head: Normocephalic and atraumatic.  Musculoskeletal:        General: Normal range of motion.     Cervical back: Normal range of motion.  Neurological:     Mental Status: She is alert and oriented to person, place, and time.     Comments: Speaks in complete, fluent sentences and answers questions appropriately.     ED Course/Procedures     Procedures  MDM     86 year old female received at signout from Heidi Dominguez to follow-up on urinalysis and repeat troponin.  Please see her note for further work-up and medical decision making.  In brief, the patient had a near syncopal episode after straining to have a bowel movement.  Repeat troponin is normal.  Urinalysis is concerning for infection.  Previous urine cultures reviewed.  Will discharge home with doxycycline.  Urine culture is pending.  The patient's daughter feels that the patient is safe for discharge to home as she has 24-hour care that can be arranged to come later tonight if the patient is discharged.  Patient has no complaints at this time.  All questions answered.  She is hemodynamically stable to no acute distress.  ER return precautions given.  Safe for discharge to home with doxycycline and outpatient follow-up as needed.   [name], PA-C 11/03/19 0802    Orpah Greek, MD 11/04/19 [phone]

## 2019-11-03 NOTE — Telephone Encounter (Signed)
Ov with all meds in hand - nothing else to offer as already saw ENT

## 2019-11-03 NOTE — Discharge Instructions (Addendum)
Thank you for allowing me to care for you today in the Emergency Department.   Your urine was concerning for infection.  A urine culture has been sent to the lab, but may take several days to grow.  Please take 1 tablet of doxycycline 2 times daily for the next 7 days.  This prescription has been called into the 24[facility]hour [facility] on [address].  Continue taking home medications as prescribed. Make sure you are staying well-hydrated water. Follow-up with your primary care doctor for recheck of your symptoms. Return to the emergency room with any new, worsening, concerning symptoms.

## 2019-11-06 ENCOUNTER — Other Ambulatory Visit: Payer: Self-pay | Admitting: Internal Medicine

## 2019-11-06 DIAGNOSIS — R058 Other specified cough: Secondary | ICD-10-CM

## 2019-11-06 DIAGNOSIS — R05 Cough: Secondary | ICD-10-CM

## 2019-11-06 MED ORDER — FAMOTIDINE 20 MG PO TABS: ORAL_TABLET | ORAL | 2 refills | Status: DC

## 2019-11-10 DIAGNOSIS — H353211 Exudative age-related macular degeneration, right eye, with active choroidal neovascularization: Secondary | ICD-10-CM | POA: Diagnosis not present

## 2019-11-10 DIAGNOSIS — E113593 Type 2 diabetes mellitus with proliferative diabetic retinopathy without macular edema, bilateral: Secondary | ICD-10-CM | POA: Diagnosis not present

## 2019-11-17 DIAGNOSIS — E1140 Type 2 diabetes mellitus with diabetic neuropathy, unspecified: Secondary | ICD-10-CM | POA: Diagnosis not present

## 2019-11-17 DIAGNOSIS — R269 Unspecified abnormalities of gait and mobility: Secondary | ICD-10-CM | POA: Diagnosis not present

## 2019-11-17 DIAGNOSIS — R05 Cough: Secondary | ICD-10-CM | POA: Diagnosis not present

## 2019-11-17 DIAGNOSIS — E113299 Type 2 diabetes mellitus with mild nonproliferative diabetic retinopathy without macular edema, unspecified eye: Secondary | ICD-10-CM | POA: Diagnosis not present

## 2019-11-18 DIAGNOSIS — Z9181 History of falling: Secondary | ICD-10-CM | POA: Diagnosis not present

## 2019-11-18 DIAGNOSIS — H9313 Tinnitus, bilateral: Secondary | ICD-10-CM | POA: Diagnosis not present

## 2019-11-18 DIAGNOSIS — R32 Unspecified urinary incontinence: Secondary | ICD-10-CM | POA: Diagnosis not present

## 2019-11-18 DIAGNOSIS — E1140 Type 2 diabetes mellitus with diabetic neuropathy, unspecified: Secondary | ICD-10-CM | POA: Diagnosis not present

## 2019-11-18 DIAGNOSIS — I110 Hypertensive heart disease with heart failure: Secondary | ICD-10-CM | POA: Diagnosis not present

## 2019-11-18 DIAGNOSIS — G8929 Other chronic pain: Secondary | ICD-10-CM | POA: Diagnosis not present

## 2019-11-18 DIAGNOSIS — K559 Vascular disorder of intestine, unspecified: Secondary | ICD-10-CM | POA: Diagnosis not present

## 2019-11-18 DIAGNOSIS — D696 Thrombocytopenia, unspecified: Secondary | ICD-10-CM | POA: Diagnosis not present

## 2019-11-18 DIAGNOSIS — H538 Other visual disturbances: Secondary | ICD-10-CM | POA: Diagnosis not present

## 2019-11-18 DIAGNOSIS — E7800 Pure hypercholesterolemia, unspecified: Secondary | ICD-10-CM | POA: Diagnosis not present

## 2019-11-18 DIAGNOSIS — Z7984 Long term (current) use of oral hypoglycemic drugs: Secondary | ICD-10-CM | POA: Diagnosis not present

## 2019-11-18 DIAGNOSIS — N390 Urinary tract infection, site not specified: Secondary | ICD-10-CM | POA: Diagnosis not present

## 2019-11-18 DIAGNOSIS — I5032 Chronic diastolic (congestive) heart failure: Secondary | ICD-10-CM | POA: Diagnosis not present

## 2019-11-18 DIAGNOSIS — E113293 Type 2 diabetes mellitus with mild nonproliferative diabetic retinopathy without macular edema, bilateral: Secondary | ICD-10-CM | POA: Diagnosis not present

## 2019-11-18 DIAGNOSIS — Z6829 Body mass index (BMI) 29.0-29.9, adult: Secondary | ICD-10-CM | POA: Diagnosis not present

## 2019-11-18 DIAGNOSIS — K449 Diaphragmatic hernia without obstruction or gangrene: Secondary | ICD-10-CM | POA: Diagnosis not present

## 2019-11-18 DIAGNOSIS — E669 Obesity, unspecified: Secondary | ICD-10-CM | POA: Diagnosis not present

## 2019-11-18 DIAGNOSIS — I051 Rheumatic mitral insufficiency: Secondary | ICD-10-CM | POA: Diagnosis not present

## 2019-11-18 DIAGNOSIS — K5900 Constipation, unspecified: Secondary | ICD-10-CM | POA: Diagnosis not present

## 2019-11-20 DIAGNOSIS — E1140 Type 2 diabetes mellitus with diabetic neuropathy, unspecified: Secondary | ICD-10-CM | POA: Diagnosis not present

## 2019-11-20 DIAGNOSIS — E113293 Type 2 diabetes mellitus with mild nonproliferative diabetic retinopathy without macular edema, bilateral: Secondary | ICD-10-CM | POA: Diagnosis not present

## 2019-11-20 DIAGNOSIS — I110 Hypertensive heart disease with heart failure: Secondary | ICD-10-CM | POA: Diagnosis not present

## 2019-11-20 DIAGNOSIS — E7800 Pure hypercholesterolemia, unspecified: Secondary | ICD-10-CM | POA: Diagnosis not present

## 2019-11-20 DIAGNOSIS — K449 Diaphragmatic hernia without obstruction or gangrene: Secondary | ICD-10-CM | POA: Diagnosis not present

## 2019-11-20 DIAGNOSIS — I5032 Chronic diastolic (congestive) heart failure: Secondary | ICD-10-CM | POA: Diagnosis not present

## 2019-11-21 ENCOUNTER — Other Ambulatory Visit (HOSPITAL_COMMUNITY): Payer: Self-pay

## 2019-11-21 ENCOUNTER — Telehealth: Payer: Self-pay | Admitting: Sports Medicine

## 2019-11-21 DIAGNOSIS — R059 Cough, unspecified: Secondary | ICD-10-CM

## 2019-11-21 DIAGNOSIS — R1310 Dysphagia, unspecified: Secondary | ICD-10-CM

## 2019-11-21 DIAGNOSIS — R05 Cough: Secondary | ICD-10-CM

## 2019-11-21 NOTE — Telephone Encounter (Signed)
The account number is [id number]. If you would please give me a call back at [phone]. Thank you.

## 2019-11-25 ENCOUNTER — Other Ambulatory Visit: Payer: Self-pay

## 2019-11-25 ENCOUNTER — Ambulatory Visit (INDEPENDENT_AMBULATORY_CARE_PROVIDER_SITE_OTHER): Payer: Medicare Other | Admitting: Sports Medicine

## 2019-11-25 ENCOUNTER — Encounter: Payer: Self-pay | Admitting: Sports Medicine

## 2019-11-25 VITALS — Temp 97.8°F

## 2019-11-25 DIAGNOSIS — M2141 Flat foot [pes planus] (acquired), right foot: Secondary | ICD-10-CM

## 2019-11-25 DIAGNOSIS — M2142 Flat foot [pes planus] (acquired), left foot: Secondary | ICD-10-CM

## 2019-11-25 DIAGNOSIS — M2041 Other hammer toe(s) (acquired), right foot: Secondary | ICD-10-CM

## 2019-11-25 DIAGNOSIS — M79674 Pain in right toe(s): Secondary | ICD-10-CM

## 2019-11-25 DIAGNOSIS — B351 Tinea unguium: Secondary | ICD-10-CM

## 2019-11-25 DIAGNOSIS — M2042 Other hammer toe(s) (acquired), left foot: Secondary | ICD-10-CM

## 2019-11-25 DIAGNOSIS — M79675 Pain in left toe(s): Secondary | ICD-10-CM

## 2019-11-25 DIAGNOSIS — M21619 Bunion of unspecified foot: Secondary | ICD-10-CM

## 2019-11-25 DIAGNOSIS — E1151 Type 2 diabetes mellitus with diabetic peripheral angiopathy without gangrene: Secondary | ICD-10-CM

## 2019-11-25 DIAGNOSIS — E1142 Type 2 diabetes mellitus with diabetic polyneuropathy: Secondary | ICD-10-CM | POA: Diagnosis not present

## 2019-11-25 NOTE — Progress Notes (Signed)
Subjective: Heidi Dominguez is a 86 y.o. female patient with history of diabetes who returns to office today complaining of long, painful nails  while ambulating in shoes; unable to trim. Reports that she thinks it is time for her shoes but is concerned because her insurance said they recover but she was still billed for $85.  Patient is assisted by facility aide this visit.  Patient Active Problem List   Diagnosis Date Noted  . Unspecified chronic bronchitis ([place]) 09/02/2019  . Infection or inflammatory reaction due to internal joint prosthesis ([place]) 02/20/2019  . Rhinitis, chronic 01/07/2019  . Infection of shoulder ([place]) 11/08/2018  . History of hemiarthroplasty of right shoulder 09/25/2018  . UTI (urinary tract infection) 09/12/2018  . Acute pancreatitis 08/28/2018  . Acute recurrent pancreatitis   . Abdominal pain 08/23/2018  . Elevated LFTs 07/04/2018  . CKD (chronic kidney disease) stage 3, GFR 30-59 ml/min 07/04/2018  . Upper airway cough syndrome 03/13/2018  . Sensorineural hearing loss (SNHL), bilateral 12/04/2016  . Post-nasal drainage 03/14/2016  . Presbycusis of both ears 03/14/2016  . Bilateral hearing loss 10/06/2015  . Bilateral impacted cerumen 10/06/2015  . 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 Subjective tinnitus of both ears 10/06/2015  . Choledocholithiasis with chronic cholecystitis, nonobstructing 04/11/2015  . Pre-operative cardiovascular examination, high risk surgery   . Epigastric abdominal pain 04/08/2015  . Acute gallstone pancreatitis s/p lap chole w [facility] 04/11/2015 04/08/2015  . Diabetes mellitus type 2, controlled ([place]) 04/08/2015  . Gallstone pancreatitis 04/08/2015  . Mitral regurgitation 04/08/2015  . Chronic diastolic CHF (congestive heart failure) ([place]) 04/08/2015  . DOE (dyspnea on exertion) 03/31/2015  . Ischemic colitis ([place]) 08/09/2012  . Colitis 08/07/2012  . Rectal bleeding 08/07/2012  . Weakness 09/24/2011  .  Bradycardia 09/24/2011  . Acute lower UTI 09/24/2011  . Hematochezia 09/24/2011  . Essential hypertension 09/24/2011  . Dyslipidemia 09/24/2011   Current Outpatient Medications on File Prior to Visit  Medication Sig Dispense Refill  . albuterol (PROVENTIL HFA;VENTOLIN HFA) 108 (90 Base) MCG/ACT inhaler Inhale into the lungs every 6 (six) hours as needed for wheezing or shortness of breath.    Marland Kitchen amLODipine (NORVASC) 10 MG tablet TK 1 T PO QD    . aspirin EC 81 MG tablet Take 81 mg by mouth daily.    . Cholecalciferol (VITAMIN D-3) 125 MCG (5000 UT) TABS Take 5,000 Units by mouth daily.    Marland Kitchen doxycycline (VIBRAMYCIN) 100 MG capsule Take 1 capsule (100 mg total) by mouth 2 (two) times daily. 20 capsule 0  . famotidine (PEPCID) 20 MG tablet Take one after  supper 30 tablet 2  . fentaNYL (DURAGESIC) 25 MCG/HR APP 1 PA EXT TO THE SKIN Q 72 H    . Ferrous Gluconate (IRON) 240 (27 FE) MG TABS Take 240 mg by mouth daily.     . furosemide (LASIX) 20 MG tablet furosemide 20 mg tablet    . JANUVIA 50 MG tablet Take 50 mg by mouth daily.   5  . levalbuterol (XOPENEX) 1.25 MG/3ML nebulizer solution 1 vial in neb every 4 hours as needed    . losartan (COZAAR) 50 MG tablet Take 1 tablet (50 mg total) by mouth daily. 30 tablet 11  . pantoprazole (PROTONIX) 40 MG tablet Take 1 tablet (40 mg total) by mouth daily. Take 30-60 min before first meal of the day 30 tablet 5  . potassium chloride (K-DUR) 10 MEQ tablet TK 1 T  PO  ONCE A DAY    . vitamin B-12 (CYANOCOBALAMIN) 1000 MCG tablet Take 1,000 mcg by mouth daily.    . ciprofloxacin (CIPRO) 500 MG tablet SMARTSIG:1 Tablet(s) By Mouth Every 12 Hours    . erythromycin ophthalmic ointment erythromycin 5 mg/gram (0.5 %) eye ointment  APPLY A SMALL AMOUNT INTO THE RIGHT EYE FOUR TIMES DAILY AS NEEDED FOR PAIN    . fluconazole (DIFLUCAN) 100 MG tablet fluconazole 100 mg tablet  TAKE 1 TABLET BY MOUTH EVERY DAY FOR 1 DAY    . lisinopril (ZESTRIL) 5 MG tablet  lisinopril 5 mg tablet  TK 1 T PO QD    . Magnesium (V-R MAGNESIUM) 250 MG TABS Take by mouth.    . Multiple Vitamins-Minerals (PRESERVISION AREDS 2) CAPS     . ONETOUCH VERIO test strip USE TO CHECK BLOOD SUGAR ONCE DAILY.    Carren Rang COMPLETE 0.6 % SOLN     . UNABLE TO FIND     . vitamin B-12 (CYANOCOBALAMIN) 500 MCG tablet      No current facility-administered medications on file prior to visit.   Allergies  Allergen Reactions  . Catapres [Clonidine Hcl] Other (See Comments)    Unknown. Unknown.  . Codeine Other (See Comments)    "Makes me out of my head, loopy"  . Covera-Hs [Verapamil Hcl] Other (See Comments)    Unknown.  Marland Kitchen Hydrocodone Other (See Comments)    "makes me go out of my head, loopy"  . Other Other (See Comments)    Any type of narcotics Feels "loopy" Any type of narcotics Feels "loopy"  . Sulfa Antibiotics Itching  . Sulfamethoxazole Diarrhea  . Verapamil Diarrhea and Other (See Comments)  . Sulfasalazine Itching    Recent Results (from the past 2160 hour(s))  CBG monitoring, ED     Status: Abnormal   Collection Time: 11/02/19  7:54 PM  Result Value Ref Range   Glucose-Capillary 205 (H) 70 - 99 mg/dL    Comment: Glucose reference range applies only to samples taken after fasting for at least 8 hours.  CBC with Differential     Status: Abnormal   Collection Time: 11/02/19  8:13 PM  Result Value Ref Range   WBC 7.8 4.0 - 10.5 K/uL   RBC 5.15 (H) 3.87 - 5.11 MIL/uL   Hemoglobin 14.8 12.0 - 15.0 g/dL   HCT 48.0 (H) 36.0 - 46.0 %   MCV 93.2 80.0 - 100.0 fL   MCH 28.7 26.0 - 34.0 pg   MCHC 30.8 30.0 - 36.0 g/dL   RDW 14.1 11.5 - 15.5 %   Platelets 170 150 - 400 K/uL   nRBC 0.0 0.0 - 0.2 %   Neutrophils Relative % 65 %   Neutro Abs 5.1 1.7 - 7.7 K/uL   Lymphocytes Relative 21 %   Lymphs Abs 1.6 0.7 - 4.0 K/uL   Monocytes Relative 11 %   Monocytes Absolute 0.8 0.1 - 1.0 K/uL   Eosinophils Relative 2 %   Eosinophils Absolute 0.2 0.0 - 0.5 K/uL    Basophils Relative 0 %   Basophils Absolute 0.0 0.0 - 0.1 K/uL   Immature Granulocytes 1 %   Abs Immature Granulocytes 0.05 0.00 - 0.07 K/uL    Comment: Performed at [facility], [address], [place], [place] [address]  Comprehensive metabolic panel     Status: Abnormal   Collection Time: 11/02/19  8:13 PM  Result Value Ref Range   Sodium  136 135 - 145 mmol/L   Potassium 4.0 3.5 - 5.1 mmol/L   Chloride 101 98 - 111 mmol/L   CO2 24 22 - 32 mmol/L   Glucose, Bld 231 (H) 70 - 99 mg/dL    Comment: Glucose reference range applies only to samples taken after fasting for at least 8 hours.   BUN 20 8 - 23 mg/dL   Creatinine, Ser 0.86 0.44 - 1.00 mg/dL   Calcium 9.1 8.9 - 10.3 mg/dL   Total Protein 7.0 6.5 - 8.1 g/dL   Albumin 3.0 (L) 3.5 - 5.0 g/dL   AST 22 15 - 41 U/L   ALT 12 0 - 44 U/L   Alkaline Phosphatase 98 38 - 126 U/L   Total Bilirubin 0.6 0.3 - 1.2 mg/dL   GFR calc non Af Amer 57 (L) >60 mL/min   GFR calc Af Amer >60 >60 mL/min   Anion gap 11 5 - 15    Comment: Performed at [facility] [address] [place], [place] [address]  Urinalysis, Routine w reflex microscopic     Status: Abnormal   Collection Time: 11/02/19  8:13 PM  Result Value Ref Range   Color, Urine YELLOW YELLOW   APPearance HAZY (A) CLEAR   Specific Gravity, Urine 1.015 1.005 - 1.030   pH 5.0 5.0 - 8.0   Glucose, UA NEGATIVE NEGATIVE mg/dL   Hgb urine dipstick NEGATIVE NEGATIVE   Bilirubin Urine NEGATIVE NEGATIVE   Ketones, ur NEGATIVE NEGATIVE mg/dL   Protein, ur NEGATIVE NEGATIVE mg/dL   Nitrite NEGATIVE NEGATIVE   Leukocytes,Ua LARGE (A) NEGATIVE   RBC / HPF 0-5 0 - 5 RBC/hpf   WBC, UA >50 (H) 0 - 5 WBC/hpf   Bacteria, UA RARE (A) NONE SEEN   Squamous Epithelial / LPF 0-5 0 - 5    Comment: Performed at [facility] [address], [place], [place] [address]  Troponin I (High Sensitivity)     Status: None   Collection Time: 11/02/19  8:13 PM  Result Value Ref Range    Troponin I (High Sensitivity) 4 <18 ng/L    Comment: (NOTE) Elevated high sensitivity troponin I (hsTnI) values and significant  changes across serial measurements may suggest ACS but many other  chronic and acute conditions are known to elevate hsTnI results.  Refer to the "Links" section for chest pain algorithms and additional  guidance. Performed at [facility], [place] [address], [place], [place] [address]   Troponin I (High Sensitivity)     Status: None   Collection Time: 11/02/19 10:13 PM  Result Value Ref Range   Troponin I (High Sensitivity) 7 <18 ng/L    Comment: (NOTE) Elevated high sensitivity troponin I (hsTnI) values and significant  changes across serial measurements may suggest ACS but many other  chronic and acute conditions are known to elevate hsTnI results.  Refer to the "Links" section for chest pain algorithms and additional  guidance. Performed at [facility], [place] [address] [place], [place] [address]   Urine culture     Status: Abnormal   Collection Time: 11/02/19 11:54 PM   Specimen: Urine, Clean Catch  Result Value Ref Range   Specimen Description URINE, CLEAN CATCH    Special Requests      NONE Performed at [facility], [place] [address] [place],  [address]    Culture MULTIPLE SPECIES PRESENT, SUGGEST RECOLLECTION (A)    Report Status  11/03/2019 FINAL     Objective: General: Patient is awake, alert, and oriented x 3 and in no acute distress.  Integument: Skin is warm, dry and supple bilateral. Nails are tender, long, thickened and dystrophic with subungual debris, consistent with onychomycosis, 1-5 bilateral. No signs of infection. No open lesions. + Minimal reactive keratosis at bunion sites with no ulceration and no blood or heme.  Pre-ulcerative in nature. Remaining integument unremarkable.  Vasculature:  Dorsalis Pedis pulse 1/4 bilateral. Posterior Tibial pulse  0/4 bilateral. Capillary fill time <3 sec 1-5 bilateral. No  hair growth to the level of the digits.Temperature gradient within normal limits. + varicosities present bilateral. Trace edema present bilateral.   Neurology: The patient has intact sensation measured with a 5.07/10g Semmes Weinstein Monofilament at all pedal sites bilateral . Vibratory sensation diminished bilateral with tuning fork. No Babinski sign present bilateral.   Musculoskeletal: + Bunion, hammertoes, pes planus symptomatic pedal deformities noted bilateral. Muscular strength 4/5 in all lower extremity muscular groups bilateral without pain on range of motion. No tenderness with calf compression bilateral however there is mild tenderness to anterior shins and ankles that is diffuse as previous.   Assessment and Plan: Problem List Items Addressed This Visit    None    Visit Diagnoses    Pain due to onychomycosis of toenails of both feet    -  Primary   Relevant Medications   fluconazole (DIFLUCAN) 100 MG tablet   Diabetic peripheral neuropathy associated with type 2 diabetes mellitus (HCC)       Relevant Medications   lisinopril (ZESTRIL) 5 MG tablet   Type II diabetes mellitus with peripheral circulatory disorder (HCC)       Relevant Medications   lisinopril (ZESTRIL) 5 MG tablet   Bunion       Pes planus of both feet       Hammer toes of both feet         -Examined patient. -Re-discussed and educated patient on diabetic foot care, especially with regards to the vascular, neurological and musculoskeletal systems.  -Mechanically debrided nails x 10 using sterile nail nipper without incident  -Continue with bunion and hammertoe padding for left as needed; dispensed new tube foam this visit [employer] diabetic shoe order form was completed; office to contact primary care for approval / certification;  Office to arrange shoe fitting and dispensing. -Return in 3 months for nail trim and will call for diabetic shoe measurements -Patient advised to call the office if any problems or  questions arise in the meantime.  Landis Martins, DPM

## 2019-11-27 DIAGNOSIS — E113293 Type 2 diabetes mellitus with mild nonproliferative diabetic retinopathy without macular edema, bilateral: Secondary | ICD-10-CM | POA: Diagnosis not present

## 2019-11-27 DIAGNOSIS — E7800 Pure hypercholesterolemia, unspecified: Secondary | ICD-10-CM | POA: Diagnosis not present

## 2019-11-27 DIAGNOSIS — I5032 Chronic diastolic (congestive) heart failure: Secondary | ICD-10-CM | POA: Diagnosis not present

## 2019-11-27 DIAGNOSIS — K449 Diaphragmatic hernia without obstruction or gangrene: Secondary | ICD-10-CM | POA: Diagnosis not present

## 2019-11-27 DIAGNOSIS — E1140 Type 2 diabetes mellitus with diabetic neuropathy, unspecified: Secondary | ICD-10-CM | POA: Diagnosis not present

## 2019-11-27 DIAGNOSIS — I110 Hypertensive heart disease with heart failure: Secondary | ICD-10-CM | POA: Diagnosis not present

## 2019-11-28 ENCOUNTER — Ambulatory Visit (HOSPITAL_COMMUNITY)
Admission: RE | Admit: 2019-11-28 | Discharge: 2019-11-28 | Disposition: A | Discharge status: Home / Self Care | Payer: Medicare Other | Source: Ambulatory Visit | Attending: Geriatric Medicine | Admitting: Geriatric Medicine

## 2019-11-28 ENCOUNTER — Other Ambulatory Visit: Payer: Self-pay

## 2019-11-28 DIAGNOSIS — R05 Cough: Secondary | ICD-10-CM | POA: Diagnosis not present

## 2019-11-28 DIAGNOSIS — R1310 Dysphagia, unspecified: Secondary | ICD-10-CM | POA: Insufficient documentation

## 2019-11-28 DIAGNOSIS — R059 Cough, unspecified: Secondary | ICD-10-CM

## 2019-12-02 DIAGNOSIS — E1140 Type 2 diabetes mellitus with diabetic neuropathy, unspecified: Secondary | ICD-10-CM | POA: Diagnosis not present

## 2019-12-02 DIAGNOSIS — I5032 Chronic diastolic (congestive) heart failure: Secondary | ICD-10-CM | POA: Diagnosis not present

## 2019-12-02 DIAGNOSIS — E7800 Pure hypercholesterolemia, unspecified: Secondary | ICD-10-CM | POA: Diagnosis not present

## 2019-12-02 DIAGNOSIS — E113293 Type 2 diabetes mellitus with mild nonproliferative diabetic retinopathy without macular edema, bilateral: Secondary | ICD-10-CM | POA: Diagnosis not present

## 2019-12-02 DIAGNOSIS — I110 Hypertensive heart disease with heart failure: Secondary | ICD-10-CM | POA: Diagnosis not present

## 2019-12-02 DIAGNOSIS — K449 Diaphragmatic hernia without obstruction or gangrene: Secondary | ICD-10-CM | POA: Diagnosis not present

## 2019-12-04 DIAGNOSIS — I110 Hypertensive heart disease with heart failure: Secondary | ICD-10-CM | POA: Diagnosis not present

## 2019-12-04 DIAGNOSIS — K449 Diaphragmatic hernia without obstruction or gangrene: Secondary | ICD-10-CM | POA: Diagnosis not present

## 2019-12-04 DIAGNOSIS — I5032 Chronic diastolic (congestive) heart failure: Secondary | ICD-10-CM | POA: Diagnosis not present

## 2019-12-04 DIAGNOSIS — E1140 Type 2 diabetes mellitus with diabetic neuropathy, unspecified: Secondary | ICD-10-CM | POA: Diagnosis not present

## 2019-12-04 DIAGNOSIS — E7800 Pure hypercholesterolemia, unspecified: Secondary | ICD-10-CM | POA: Diagnosis not present

## 2019-12-04 DIAGNOSIS — E113293 Type 2 diabetes mellitus with mild nonproliferative diabetic retinopathy without macular edema, bilateral: Secondary | ICD-10-CM | POA: Diagnosis not present

## 2019-12-09 DIAGNOSIS — E7800 Pure hypercholesterolemia, unspecified: Secondary | ICD-10-CM | POA: Diagnosis not present

## 2019-12-09 DIAGNOSIS — E1140 Type 2 diabetes mellitus with diabetic neuropathy, unspecified: Secondary | ICD-10-CM | POA: Diagnosis not present

## 2019-12-09 DIAGNOSIS — K449 Diaphragmatic hernia without obstruction or gangrene: Secondary | ICD-10-CM | POA: Diagnosis not present

## 2019-12-09 DIAGNOSIS — I5032 Chronic diastolic (congestive) heart failure: Secondary | ICD-10-CM | POA: Diagnosis not present

## 2019-12-09 DIAGNOSIS — E113293 Type 2 diabetes mellitus with mild nonproliferative diabetic retinopathy without macular edema, bilateral: Secondary | ICD-10-CM | POA: Diagnosis not present

## 2019-12-09 DIAGNOSIS — I110 Hypertensive heart disease with heart failure: Secondary | ICD-10-CM | POA: Diagnosis not present

## 2019-12-11 DIAGNOSIS — K449 Diaphragmatic hernia without obstruction or gangrene: Secondary | ICD-10-CM | POA: Diagnosis not present

## 2019-12-11 DIAGNOSIS — E113293 Type 2 diabetes mellitus with mild nonproliferative diabetic retinopathy without macular edema, bilateral: Secondary | ICD-10-CM | POA: Diagnosis not present

## 2019-12-11 DIAGNOSIS — E7800 Pure hypercholesterolemia, unspecified: Secondary | ICD-10-CM | POA: Diagnosis not present

## 2019-12-11 DIAGNOSIS — I110 Hypertensive heart disease with heart failure: Secondary | ICD-10-CM | POA: Diagnosis not present

## 2019-12-11 DIAGNOSIS — E1140 Type 2 diabetes mellitus with diabetic neuropathy, unspecified: Secondary | ICD-10-CM | POA: Diagnosis not present

## 2019-12-11 DIAGNOSIS — I5032 Chronic diastolic (congestive) heart failure: Secondary | ICD-10-CM | POA: Diagnosis not present

## 2019-12-12 ENCOUNTER — Ambulatory Visit: Payer: Medicare Other | Admitting: Orthotics

## 2019-12-15 DIAGNOSIS — H353211 Exudative age-related macular degeneration, right eye, with active choroidal neovascularization: Secondary | ICD-10-CM | POA: Diagnosis not present

## 2019-12-17 DIAGNOSIS — E7800 Pure hypercholesterolemia, unspecified: Secondary | ICD-10-CM | POA: Diagnosis not present

## 2019-12-17 DIAGNOSIS — E1140 Type 2 diabetes mellitus with diabetic neuropathy, unspecified: Secondary | ICD-10-CM | POA: Diagnosis not present

## 2019-12-17 DIAGNOSIS — K449 Diaphragmatic hernia without obstruction or gangrene: Secondary | ICD-10-CM | POA: Diagnosis not present

## 2019-12-17 DIAGNOSIS — I110 Hypertensive heart disease with heart failure: Secondary | ICD-10-CM | POA: Diagnosis not present

## 2019-12-17 DIAGNOSIS — I5032 Chronic diastolic (congestive) heart failure: Secondary | ICD-10-CM | POA: Diagnosis not present

## 2019-12-17 DIAGNOSIS — E113293 Type 2 diabetes mellitus with mild nonproliferative diabetic retinopathy without macular edema, bilateral: Secondary | ICD-10-CM | POA: Diagnosis not present

## 2019-12-18 DIAGNOSIS — H9313 Tinnitus, bilateral: Secondary | ICD-10-CM | POA: Diagnosis not present

## 2019-12-18 DIAGNOSIS — K559 Vascular disorder of intestine, unspecified: Secondary | ICD-10-CM | POA: Diagnosis not present

## 2019-12-18 DIAGNOSIS — Z9181 History of falling: Secondary | ICD-10-CM | POA: Diagnosis not present

## 2019-12-18 DIAGNOSIS — E669 Obesity, unspecified: Secondary | ICD-10-CM | POA: Diagnosis not present

## 2019-12-18 DIAGNOSIS — E1140 Type 2 diabetes mellitus with diabetic neuropathy, unspecified: Secondary | ICD-10-CM | POA: Diagnosis not present

## 2019-12-18 DIAGNOSIS — K5900 Constipation, unspecified: Secondary | ICD-10-CM | POA: Diagnosis not present

## 2019-12-18 DIAGNOSIS — R32 Unspecified urinary incontinence: Secondary | ICD-10-CM | POA: Diagnosis not present

## 2019-12-18 DIAGNOSIS — D696 Thrombocytopenia, unspecified: Secondary | ICD-10-CM | POA: Diagnosis not present

## 2019-12-18 DIAGNOSIS — N390 Urinary tract infection, site not specified: Secondary | ICD-10-CM | POA: Diagnosis not present

## 2019-12-18 DIAGNOSIS — G8929 Other chronic pain: Secondary | ICD-10-CM | POA: Diagnosis not present

## 2019-12-18 DIAGNOSIS — H538 Other visual disturbances: Secondary | ICD-10-CM | POA: Diagnosis not present

## 2019-12-18 DIAGNOSIS — I110 Hypertensive heart disease with heart failure: Secondary | ICD-10-CM | POA: Diagnosis not present

## 2019-12-18 DIAGNOSIS — Z6829 Body mass index (BMI) 29.0-29.9, adult: Secondary | ICD-10-CM | POA: Diagnosis not present

## 2019-12-18 DIAGNOSIS — E113293 Type 2 diabetes mellitus with mild nonproliferative diabetic retinopathy without macular edema, bilateral: Secondary | ICD-10-CM | POA: Diagnosis not present

## 2019-12-18 DIAGNOSIS — I5032 Chronic diastolic (congestive) heart failure: Secondary | ICD-10-CM | POA: Diagnosis not present

## 2019-12-18 DIAGNOSIS — K449 Diaphragmatic hernia without obstruction or gangrene: Secondary | ICD-10-CM | POA: Diagnosis not present

## 2019-12-18 DIAGNOSIS — I051 Rheumatic mitral insufficiency: Secondary | ICD-10-CM | POA: Diagnosis not present

## 2019-12-18 DIAGNOSIS — E7800 Pure hypercholesterolemia, unspecified: Secondary | ICD-10-CM | POA: Diagnosis not present

## 2019-12-18 DIAGNOSIS — Z7984 Long term (current) use of oral hypoglycemic drugs: Secondary | ICD-10-CM | POA: Diagnosis not present

## 2019-12-23 DIAGNOSIS — E7800 Pure hypercholesterolemia, unspecified: Secondary | ICD-10-CM | POA: Diagnosis not present

## 2019-12-23 DIAGNOSIS — K449 Diaphragmatic hernia without obstruction or gangrene: Secondary | ICD-10-CM | POA: Diagnosis not present

## 2019-12-23 DIAGNOSIS — E113293 Type 2 diabetes mellitus with mild nonproliferative diabetic retinopathy without macular edema, bilateral: Secondary | ICD-10-CM | POA: Diagnosis not present

## 2019-12-23 DIAGNOSIS — E1140 Type 2 diabetes mellitus with diabetic neuropathy, unspecified: Secondary | ICD-10-CM | POA: Diagnosis not present

## 2019-12-23 DIAGNOSIS — I110 Hypertensive heart disease with heart failure: Secondary | ICD-10-CM | POA: Diagnosis not present

## 2019-12-23 DIAGNOSIS — I5032 Chronic diastolic (congestive) heart failure: Secondary | ICD-10-CM | POA: Diagnosis not present

## 2019-12-24 DIAGNOSIS — R05 Cough: Secondary | ICD-10-CM | POA: Diagnosis not present

## 2019-12-24 DIAGNOSIS — K219 Gastro-esophageal reflux disease without esophagitis: Secondary | ICD-10-CM | POA: Diagnosis not present

## 2019-12-25 DIAGNOSIS — K449 Diaphragmatic hernia without obstruction or gangrene: Secondary | ICD-10-CM | POA: Diagnosis not present

## 2019-12-25 DIAGNOSIS — E113293 Type 2 diabetes mellitus with mild nonproliferative diabetic retinopathy without macular edema, bilateral: Secondary | ICD-10-CM | POA: Diagnosis not present

## 2019-12-25 DIAGNOSIS — E1140 Type 2 diabetes mellitus with diabetic neuropathy, unspecified: Secondary | ICD-10-CM | POA: Diagnosis not present

## 2019-12-25 DIAGNOSIS — I5032 Chronic diastolic (congestive) heart failure: Secondary | ICD-10-CM | POA: Diagnosis not present

## 2019-12-25 DIAGNOSIS — I110 Hypertensive heart disease with heart failure: Secondary | ICD-10-CM | POA: Diagnosis not present

## 2019-12-25 DIAGNOSIS — E7800 Pure hypercholesterolemia, unspecified: Secondary | ICD-10-CM | POA: Diagnosis not present

## 2019-12-31 DIAGNOSIS — E1140 Type 2 diabetes mellitus with diabetic neuropathy, unspecified: Secondary | ICD-10-CM | POA: Diagnosis not present

## 2019-12-31 DIAGNOSIS — E113293 Type 2 diabetes mellitus with mild nonproliferative diabetic retinopathy without macular edema, bilateral: Secondary | ICD-10-CM | POA: Diagnosis not present

## 2019-12-31 DIAGNOSIS — I5032 Chronic diastolic (congestive) heart failure: Secondary | ICD-10-CM | POA: Diagnosis not present

## 2019-12-31 DIAGNOSIS — E7800 Pure hypercholesterolemia, unspecified: Secondary | ICD-10-CM | POA: Diagnosis not present

## 2019-12-31 DIAGNOSIS — K449 Diaphragmatic hernia without obstruction or gangrene: Secondary | ICD-10-CM | POA: Diagnosis not present

## 2019-12-31 DIAGNOSIS — I110 Hypertensive heart disease with heart failure: Secondary | ICD-10-CM | POA: Diagnosis not present

## 2020-01-06 DIAGNOSIS — K449 Diaphragmatic hernia without obstruction or gangrene: Secondary | ICD-10-CM | POA: Diagnosis not present

## 2020-01-06 DIAGNOSIS — E113293 Type 2 diabetes mellitus with mild nonproliferative diabetic retinopathy without macular edema, bilateral: Secondary | ICD-10-CM | POA: Diagnosis not present

## 2020-01-06 DIAGNOSIS — I5032 Chronic diastolic (congestive) heart failure: Secondary | ICD-10-CM | POA: Diagnosis not present

## 2020-01-06 DIAGNOSIS — I110 Hypertensive heart disease with heart failure: Secondary | ICD-10-CM | POA: Diagnosis not present

## 2020-01-06 DIAGNOSIS — E7800 Pure hypercholesterolemia, unspecified: Secondary | ICD-10-CM | POA: Diagnosis not present

## 2020-01-06 DIAGNOSIS — E1140 Type 2 diabetes mellitus with diabetic neuropathy, unspecified: Secondary | ICD-10-CM | POA: Diagnosis not present

## 2020-01-08 DIAGNOSIS — I110 Hypertensive heart disease with heart failure: Secondary | ICD-10-CM | POA: Diagnosis not present

## 2020-01-08 DIAGNOSIS — E113293 Type 2 diabetes mellitus with mild nonproliferative diabetic retinopathy without macular edema, bilateral: Secondary | ICD-10-CM | POA: Diagnosis not present

## 2020-01-08 DIAGNOSIS — K449 Diaphragmatic hernia without obstruction or gangrene: Secondary | ICD-10-CM | POA: Diagnosis not present

## 2020-01-08 DIAGNOSIS — E1140 Type 2 diabetes mellitus with diabetic neuropathy, unspecified: Secondary | ICD-10-CM | POA: Diagnosis not present

## 2020-01-08 DIAGNOSIS — I5032 Chronic diastolic (congestive) heart failure: Secondary | ICD-10-CM | POA: Diagnosis not present

## 2020-01-08 DIAGNOSIS — E7800 Pure hypercholesterolemia, unspecified: Secondary | ICD-10-CM | POA: Diagnosis not present

## 2020-01-13 DIAGNOSIS — E7800 Pure hypercholesterolemia, unspecified: Secondary | ICD-10-CM | POA: Diagnosis not present

## 2020-01-13 DIAGNOSIS — I5032 Chronic diastolic (congestive) heart failure: Secondary | ICD-10-CM | POA: Diagnosis not present

## 2020-01-13 DIAGNOSIS — E113293 Type 2 diabetes mellitus with mild nonproliferative diabetic retinopathy without macular edema, bilateral: Secondary | ICD-10-CM | POA: Diagnosis not present

## 2020-01-13 DIAGNOSIS — E1140 Type 2 diabetes mellitus with diabetic neuropathy, unspecified: Secondary | ICD-10-CM | POA: Diagnosis not present

## 2020-01-13 DIAGNOSIS — I110 Hypertensive heart disease with heart failure: Secondary | ICD-10-CM | POA: Diagnosis not present

## 2020-01-13 DIAGNOSIS — K449 Diaphragmatic hernia without obstruction or gangrene: Secondary | ICD-10-CM | POA: Diagnosis not present

## 2020-01-15 DIAGNOSIS — K449 Diaphragmatic hernia without obstruction or gangrene: Secondary | ICD-10-CM | POA: Diagnosis not present

## 2020-01-15 DIAGNOSIS — I5032 Chronic diastolic (congestive) heart failure: Secondary | ICD-10-CM | POA: Diagnosis not present

## 2020-01-15 DIAGNOSIS — E113293 Type 2 diabetes mellitus with mild nonproliferative diabetic retinopathy without macular edema, bilateral: Secondary | ICD-10-CM | POA: Diagnosis not present

## 2020-01-15 DIAGNOSIS — I110 Hypertensive heart disease with heart failure: Secondary | ICD-10-CM | POA: Diagnosis not present

## 2020-01-15 DIAGNOSIS — E1140 Type 2 diabetes mellitus with diabetic neuropathy, unspecified: Secondary | ICD-10-CM | POA: Diagnosis not present

## 2020-01-15 DIAGNOSIS — E7800 Pure hypercholesterolemia, unspecified: Secondary | ICD-10-CM | POA: Diagnosis not present

## 2020-01-26 DIAGNOSIS — H353211 Exudative age-related macular degeneration, right eye, with active choroidal neovascularization: Secondary | ICD-10-CM | POA: Diagnosis not present

## 2020-01-29 DIAGNOSIS — E7800 Pure hypercholesterolemia, unspecified: Secondary | ICD-10-CM | POA: Diagnosis not present

## 2020-01-29 DIAGNOSIS — I10 Essential (primary) hypertension: Secondary | ICD-10-CM | POA: Diagnosis not present

## 2020-01-29 DIAGNOSIS — E1140 Type 2 diabetes mellitus with diabetic neuropathy, unspecified: Secondary | ICD-10-CM | POA: Diagnosis not present

## 2020-01-29 DIAGNOSIS — I5032 Chronic diastolic (congestive) heart failure: Secondary | ICD-10-CM | POA: Diagnosis not present

## 2020-01-29 DIAGNOSIS — E113299 Type 2 diabetes mellitus with mild nonproliferative diabetic retinopathy without macular edema, unspecified eye: Secondary | ICD-10-CM | POA: Diagnosis not present

## 2020-01-29 DIAGNOSIS — D696 Thrombocytopenia, unspecified: Secondary | ICD-10-CM | POA: Diagnosis not present

## 2020-01-29 DIAGNOSIS — E119 Type 2 diabetes mellitus without complications: Secondary | ICD-10-CM | POA: Diagnosis not present

## 2020-01-29 DIAGNOSIS — I700 Atherosclerosis of aorta: Secondary | ICD-10-CM | POA: Diagnosis not present

## 2020-01-29 DIAGNOSIS — Z79899 Other long term (current) drug therapy: Secondary | ICD-10-CM | POA: Diagnosis not present

## 2020-01-29 DIAGNOSIS — Z7984 Long term (current) use of oral hypoglycemic drugs: Secondary | ICD-10-CM | POA: Diagnosis not present

## 2020-02-24 ENCOUNTER — Ambulatory Visit: Payer: Medicare Other | Admitting: Sports Medicine

## 2020-03-02 DIAGNOSIS — H353211 Exudative age-related macular degeneration, right eye, with active choroidal neovascularization: Secondary | ICD-10-CM | POA: Diagnosis not present

## 2020-03-20 ENCOUNTER — Telehealth: Payer: Self-pay | Admitting: Podiatry

## 2020-03-20 ENCOUNTER — Other Ambulatory Visit: Payer: Self-pay | Admitting: Podiatry

## 2020-03-20 MED ORDER — DOXYCYCLINE HYCLATE 100 MG PO TABS: 100.0000 mg | ORAL_TABLET | Freq: Two times a day (BID) | ORAL | 0 refills | Status: DC

## 2020-03-20 NOTE — Telephone Encounter (Signed)
Patient's daughter called stating that her caregiver noticed there was a crack in between the toes that is getting worse. The patients daughter called stating that she has been trying to get an appointment but not able to come in this past week and the crack is getting larger. I spoke to the patients caregiver and her daughter sent me a picture. There is mild redness to the toe and an ulcer between the toes. They deny any drainage or pus. She has no fever, chills and feels well. They state her appetite has not changed.   I sent doxycycline to the pharmacy. I discussed wound care instructions. Dry thoroughly between the toes and apply a small amount of betadine. They have been cleaning with peroxide and apply neosporin between the toes.   If any worsening over the weekend to call me back or go to urgent care/ER. I will get them in Monday to be seen.   Trula Slade

## 2020-03-22 ENCOUNTER — Other Ambulatory Visit: Payer: Self-pay

## 2020-03-22 ENCOUNTER — Ambulatory Visit (INDEPENDENT_AMBULATORY_CARE_PROVIDER_SITE_OTHER): Payer: Medicare Other | Admitting: Podiatry

## 2020-03-22 DIAGNOSIS — B351 Tinea unguium: Secondary | ICD-10-CM

## 2020-03-22 DIAGNOSIS — M79674 Pain in right toe(s): Secondary | ICD-10-CM

## 2020-03-22 DIAGNOSIS — L97522 Non-pressure chronic ulcer of other part of left foot with fat layer exposed: Secondary | ICD-10-CM

## 2020-03-22 DIAGNOSIS — M79675 Pain in left toe(s): Secondary | ICD-10-CM | POA: Diagnosis not present

## 2020-03-22 DIAGNOSIS — E0843 Diabetes mellitus due to underlying condition with diabetic autonomic (poly)neuropathy: Secondary | ICD-10-CM

## 2020-03-22 NOTE — Progress Notes (Signed)
Subjective:  86 y.o. female with PMHx of diabetes mellitus presenting today for evaluation of an ulcer that developed to the interdigital webspace of the left foot.  Patient denies trauma.  She states that she wears a foam pad to the interdigital area which may have caused the ulcer to develop.  She has been applying Neosporin to the area daily.  She is also currently taking doxycycline 100 mg 2 times daily that was prescribed over the weekend by Dr. Jacqualyn Posey.  Patient is also requesting a nail trim today.  She is diabetic and unable to trim her own nails.  They are painful in shoes.   Past Medical History:  Diagnosis Date  . Acute pancreatitis    hx of   . Arthritis    "qwhere" (07/04/2018)  . Chronic back pain    "all over" (07/04/2018)  . Chronic diastolic CHF (congestive heart failure) ([place])   . Chronic kidney disease    stage  3 chronic kidney disease   . Complication of anesthesia    "I have a hard time waking up"  . Degenerative joint disease of shoulder region   . Dry eye syndrome   . Dyspnea   . Family history of adverse reaction to anesthesia    "daughter has the shakes when she wakes up"  . GERD (gastroesophageal reflux disease)   . Heart murmur   . High cholesterol   . History of hiatal hernia   . HOH (hard of hearing)   . Hypertension   . Impacted cerumen of both ears    hx of   . Muscle weakness (generalized)   . Osteoarthritis   . Overactive bladder   . Pancreatitis    hx of   . Phlebitis    "BLE"  . Rotator cuff tear    right   . Tear of right supraspinatus tendon   . Thrombocytopenia (HCC)    hx of   . Type II diabetes mellitus ([place])   . Urinary tract infection    hx of   . Xerosis cutis    hx of       Objective/Physical Exam General: The patient is alert and oriented x3 in no acute distress.  Dermatology:  Wound #1 noted to the first interdigital webspace of the left foot measuring approximately 2.0 x 0.5 x 0.1 cm (LxWxD).   To the noted  ulceration(s), there is no eschar. There is a moderate amount of slough, fibrin, and necrotic tissue noted. Granulation tissue and wound base is red. There is a minimal amount of serosanguineous drainage noted. There is no exposed bone muscle-tendon ligament or joint. There is no malodor. Periwound integrity is intact. Skin is warm, dry and supple bilateral lower extremities.  Hyperkeratotic dystrophic discolored nails also noted 1-5 bilateral with sensitivity to palpation  Vascular: Palpable pedal pulses bilaterally. No edema or erythema noted. Capillary refill within normal limits.  Neurological: Epicritic and protective threshold diminished bilaterally.   Musculoskeletal Exam: Range of motion within normal limits to all pedal and ankle joints bilateral. Muscle strength 5/5 in all groups bilateral.   Assessment: 1.  Ulcer first interdigital webspace left foot secondary to diabetes mellitus 2. diabetes mellitus w/ peripheral neuropathy 3.  Pain due to onychomycosis of toenails bilateral   Plan of Care:  1. Patient was evaluated. 2. medically necessary excisional debridement including subcutaneous tissue was performed using a tissue nipper and a chisel blade. Excisional debridement of all the necrotic nonviable tissue down to healthy  bleeding viable tissue was performed with post-debridement measurements same as pre-. 3. the wound was cleansed and dry sterile dressing applied. 4.  Recommend Betadine 2 times daily to the interdigital webspace  5.  Continue oral doxycycline 100 mg 2 times daily as prescribed by Dr. Jacqualyn Posey.  Patient is also on chronic long-term medication of ciprofloxacin for right shoulder infection after replacement. 6. mechanical debridement of nails 1-5 was performed using a nail nipper without incident or bleeding.  Smoothed with a dermal.   7.  Patient is to return to clinic in 2 weeks with Dr. Cannon Kettle for follow-up regarding the ulcer to the first interdigital webspace  left foot.   Edrick Kins, DPM [facility]  Dr. Edrick Kins, [place]                                        [place], [place] [address]                Office [phone]  Fax [phone]

## 2020-03-26 ENCOUNTER — Ambulatory Visit: Payer: Medicare Other | Admitting: Podiatry

## 2020-04-01 ENCOUNTER — Ambulatory Visit: Payer: Medicare Other | Admitting: Sports Medicine

## 2020-04-08 ENCOUNTER — Ambulatory Visit: Payer: Medicare Other | Admitting: Sports Medicine

## 2020-04-09 ENCOUNTER — Encounter: Payer: Self-pay | Admitting: Podiatry

## 2020-04-09 ENCOUNTER — Other Ambulatory Visit: Payer: Self-pay

## 2020-04-09 ENCOUNTER — Ambulatory Visit (INDEPENDENT_AMBULATORY_CARE_PROVIDER_SITE_OTHER): Payer: Medicare Other | Admitting: Podiatry

## 2020-04-09 DIAGNOSIS — L97522 Non-pressure chronic ulcer of other part of left foot with fat layer exposed: Secondary | ICD-10-CM | POA: Diagnosis not present

## 2020-04-09 DIAGNOSIS — M2042 Other hammer toe(s) (acquired), left foot: Secondary | ICD-10-CM | POA: Diagnosis not present

## 2020-04-09 DIAGNOSIS — M21612 Bunion of left foot: Secondary | ICD-10-CM

## 2020-04-09 DIAGNOSIS — B353 Tinea pedis: Secondary | ICD-10-CM

## 2020-04-09 DIAGNOSIS — E0843 Diabetes mellitus due to underlying condition with diabetic autonomic (poly)neuropathy: Secondary | ICD-10-CM

## 2020-04-09 DIAGNOSIS — M2012 Hallux valgus (acquired), left foot: Secondary | ICD-10-CM | POA: Diagnosis not present

## 2020-04-09 NOTE — Patient Instructions (Signed)
Apply betadine paint daily and a dry gauze between the toes (or cotton ball) to keep the skin dry

## 2020-04-10 NOTE — Progress Notes (Signed)
  Subjective:  Patient ID: Heidi Dominguez, female    DOB: 03-13-1921,  MRN: [id number]  Chief Complaint  Patient presents with  . Foot Ulcer    f/u appt., left foot (interdigital webspace), has been soaking in betadine and applying antibiotic ointment as instructed,very little bleeding noticed    86 y.o. female presents with the above complaint. History confirmed with patient. Here with a caretaker today. They have been applying betadine and neosporin. Feels well and thinks it is improving  Objective:  Physical Exam: Left foot with severe hallux valgus and hammertoes with tinea pedis and maceration with ulceration to subcutaneous tissue on the medial hallux with macerated skin and blister formation around this.  Assessment:   1. Diabetes mellitus due to underlying condition with diabetic autonomic neuropathy, unspecified whether long term insulin use ([place])   2. Ulcer of left foot with fat layer exposed ([place])   3. Hallux valgus with bunions, left   4. Hammertoe of left foot   5. Tinea pedis of left foot      Plan:  Patient was evaluated and treated and all questions answered.  Wound is improving significantly. I believe the neosporin is likely causing too much maceration. Switch to betadine only and keep a dry gauze or cotton ball between the toes to allow airflow to keep drier. Return in 2 weeks with Dr Cannon Kettle for followup  Lanae Crumbly, [facility] 04/10/2020     No follow-ups on file.

## 2020-04-13 DIAGNOSIS — H353211 Exudative age-related macular degeneration, right eye, with active choroidal neovascularization: Secondary | ICD-10-CM | POA: Diagnosis not present

## 2020-04-26 DIAGNOSIS — Z20822 Contact with and (suspected) exposure to covid-19: Secondary | ICD-10-CM | POA: Diagnosis not present

## 2020-04-29 ENCOUNTER — Encounter: Payer: Self-pay | Admitting: Sports Medicine

## 2020-04-29 ENCOUNTER — Ambulatory Visit (INDEPENDENT_AMBULATORY_CARE_PROVIDER_SITE_OTHER): Payer: Medicare Other | Admitting: Sports Medicine

## 2020-04-29 ENCOUNTER — Other Ambulatory Visit: Payer: Self-pay

## 2020-04-29 DIAGNOSIS — M2042 Other hammer toe(s) (acquired), left foot: Secondary | ICD-10-CM

## 2020-04-29 DIAGNOSIS — L97521 Non-pressure chronic ulcer of other part of left foot limited to breakdown of skin: Secondary | ICD-10-CM

## 2020-04-29 DIAGNOSIS — E11621 Type 2 diabetes mellitus with foot ulcer: Secondary | ICD-10-CM

## 2020-04-29 DIAGNOSIS — M21612 Bunion of left foot: Secondary | ICD-10-CM | POA: Diagnosis not present

## 2020-04-29 DIAGNOSIS — E1142 Type 2 diabetes mellitus with diabetic polyneuropathy: Secondary | ICD-10-CM

## 2020-04-29 DIAGNOSIS — M2012 Hallux valgus (acquired), left foot: Secondary | ICD-10-CM

## 2020-04-29 NOTE — Progress Notes (Signed)
Subjective: Heidi Dominguez is a 86 y.o. female patient with history of diabetes who returns to office today for wound check on the left, reports that she has her home aid applying antibiotic cream and betadine in between the toes. Patient denies nausea, vomiting, fever, chills or any other constitutional symptoms at this time.  Patient reports that she wants to be refitted for new diabetic shoes wants a new color.  No other pedal complaints noted.  Patient Active Problem List   Diagnosis Date Noted  . Gastroesophageal reflux disease 10/22/2019  . Unspecified chronic bronchitis ([place]) 09/02/2019  . Infection or inflammatory reaction due to internal joint prosthesis ([place]) 02/20/2019  . Rhinitis, chronic 01/07/2019  . Infection of shoulder ([place]) 11/08/2018  . History of hemiarthroplasty of right shoulder 09/25/2018  . UTI (urinary tract infection) 09/12/2018  . Acute pancreatitis 08/28/2018  . Acute recurrent pancreatitis   . Abdominal pain 08/23/2018  . Elevated LFTs 07/04/2018  . CKD (chronic kidney disease) stage 3, GFR 30-59 ml/min 07/04/2018  . Upper airway cough syndrome 03/13/2018  . Sensorineural hearing loss (SNHL), bilateral 12/04/2016  . Post-nasal drainage 03/14/2016  . Presbycusis of both ears 03/14/2016  . Bilateral hearing loss 10/06/2015  . Bilateral impacted cerumen 10/06/2015  . 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 Subjective tinnitus of both ears 10/06/2015  . Choledocholithiasis with chronic cholecystitis, nonobstructing 04/11/2015  . Pre-operative cardiovascular examination, high risk surgery   . Epigastric abdominal pain 04/08/2015  . Acute gallstone pancreatitis s/p lap chole w [facility] 04/11/2015 04/08/2015  . Diabetes mellitus type 2, controlled ([place]) 04/08/2015  . Gallstone pancreatitis 04/08/2015  . Mitral regurgitation 04/08/2015  . Chronic diastolic CHF (congestive heart failure) ([place]) 04/08/2015  . DOE (dyspnea on exertion) 03/31/2015  .  Ischemic colitis ([place]) 08/09/2012  . Colitis 08/07/2012  . Rectal bleeding 08/07/2012  . Weakness 09/24/2011  . Bradycardia 09/24/2011  . Acute lower UTI 09/24/2011  . Hematochezia 09/24/2011  . Essential hypertension 09/24/2011  . Dyslipidemia 09/24/2011   Current Outpatient Medications on File Prior to Visit  Medication Sig Dispense Refill  . albuterol (PROVENTIL HFA;VENTOLIN HFA) 108 (90 Base) MCG/ACT inhaler Inhale into the lungs every 6 (six) hours as needed for wheezing or shortness of breath.    Marland Kitchen amLODipine (NORVASC) 10 MG tablet TK 1 T PO QD    . aspirin EC 81 MG tablet Take 81 mg by mouth daily.    . Cholecalciferol (VITAMIN D-3) 125 MCG (5000 UT) TABS Take 5,000 Units by mouth daily.    . ciprofloxacin (CIPRO) 500 MG tablet SMARTSIG:1 Tablet(s) By Mouth Every 12 Hours    . doxycycline (VIBRA-TABS) 100 MG tablet Take 1 tablet (100 mg total) by mouth 2 (two) times daily. 20 tablet 0  . erythromycin ophthalmic ointment erythromycin 5 mg/gram (0.5 %) eye ointment  APPLY A SMALL AMOUNT INTO THE RIGHT EYE FOUR TIMES DAILY AS NEEDED FOR PAIN    . famotidine (PEPCID) 20 MG tablet Take one after  supper 30 tablet 2  . fentaNYL (DURAGESIC) 25 MCG/HR APP 1 PA EXT TO THE SKIN Q 72 H    . Ferrous Gluconate (IRON) 240 (27 FE) MG TABS Take 240 mg by mouth daily.     . fluconazole (DIFLUCAN) 100 MG tablet fluconazole 100 mg tablet  TAKE 1 TABLET BY MOUTH EVERY DAY FOR 1 DAY    . furosemide (LASIX) 20 MG tablet furosemide 20 mg tablet    . JANUVIA 50 MG tablet Take  50 mg by mouth daily.   5  . levalbuterol (XOPENEX) 1.25 MG/3ML nebulizer solution 1 vial in neb every 4 hours as needed    . lisinopril (ZESTRIL) 5 MG tablet lisinopril 5 mg tablet  TK 1 T PO QD    . losartan (COZAAR) 50 MG tablet Take 1 tablet (50 mg total) by mouth daily. 30 tablet 11  . Magnesium (V-R MAGNESIUM) 250 MG TABS Take by mouth.    . Multiple Vitamins-Minerals (PRESERVISION AREDS 2) CAPS     . ONETOUCH VERIO test  strip USE TO CHECK BLOOD SUGAR ONCE DAILY.    . pantoprazole (PROTONIX) 40 MG tablet Take 1 tablet (40 mg total) by mouth daily. Take 30-60 min before first meal of the day 30 tablet 5  . potassium chloride (K-DUR) 10 MEQ tablet TK 1 T PO  ONCE A DAY    . SYSTANE COMPLETE 0.6 % SOLN     . UNABLE TO FIND     . UNABLE TO FIND albuterol sulfate HFA 90 mcg/actuation aerosol inhaler  TAKE 2 PUFFS AS NEEDED EVERY 4 HOURS    . vitamin B-12 (CYANOCOBALAMIN) 1000 MCG tablet Take 1,000 mcg by mouth daily.    . vitamin B-12 (CYANOCOBALAMIN) 500 MCG tablet      No current facility-administered medications on file prior to visit.   Allergies  Allergen Reactions  . Catapres [Clonidine Hcl] Other (See Comments)    Unknown. Unknown.  . Codeine Other (See Comments)    "Makes me out of my head, loopy"  . Covera-Hs [Verapamil Hcl] Other (See Comments)    Unknown.  Marland Kitchen Hydrocodone Other (See Comments)    "makes me go out of my head, loopy"  . Other Other (See Comments)    Any type of narcotics Feels "loopy" Any type of narcotics Feels "loopy"  . Sulfa Antibiotics Itching  . Sulfamethoxazole Diarrhea  . Verapamil Diarrhea and Other (See Comments)  . Sulfasalazine Itching    No results found for this or any previous visit (from the past 2160 hour(s)).  Objective: General: Patient is awake, alert, and oriented x 3 and in no acute distress.  Integument: Skin is warm, dry and supple bilateral. Nails are short, thickened and dystrophic with subungual debris, consistent with onychomycosis, 1-5 bilateral.  At the left first interspace there is a partial-thickness ulceration measuring less than 0.2 cm with a granular base does not probe to bone with significant surrounding soft tissue maceration likely from overmedication.  Vasculature:  Dorsalis Pedis pulse 1/4 bilateral. Posterior Tibial pulse  0/4 bilateral. Capillary fill time <3 sec 1-5 bilateral. No hair growth to the level of the digits.Temperature  gradient within normal limits. + varicosities present bilateral. Trace edema present bilateral.   Neurology: Protective sensation slightly diminished bilateral.  Musculoskeletal: No pain with palpation to the left first interspace at all patient.  + Bunion, hammertoes, pes planus asymptomatic pedal deformities noted bilateral. Muscular strength 4/5 in all lower extremity muscular groups bilateral without pain on range of motion.   Assessment and Plan: Problem List Items Addressed This Visit    None    Visit Diagnoses    Diabetic ulcer of toe of left foot associated with type 2 diabetes mellitus, limited to breakdown of skin ([place])    -  Primary   Hallux valgus with bunions, left       Hammertoe of left foot       Diabetic peripheral neuropathy associated with type 2 diabetes mellitus ([place])         -  Examined patient. -Cleanse ulceration at left first interspace and applied Betadine and advised patient to refrain from using antibiotic cream; home aide was also instructed to apply Betadine once daily -Recommend return next visit for new shoe measurements and toe check -Patient advised to call the office if any problems or questions arise in the meantime.  Landis Martins, DPM

## 2020-05-25 DIAGNOSIS — H353211 Exudative age-related macular degeneration, right eye, with active choroidal neovascularization: Secondary | ICD-10-CM | POA: Diagnosis not present

## 2020-05-31 ENCOUNTER — Ambulatory Visit: Payer: Medicare Other | Admitting: Orthotics

## 2020-06-03 ENCOUNTER — Encounter: Payer: Self-pay | Admitting: Sports Medicine

## 2020-06-03 ENCOUNTER — Other Ambulatory Visit: Payer: Self-pay

## 2020-06-03 ENCOUNTER — Ambulatory Visit (INDEPENDENT_AMBULATORY_CARE_PROVIDER_SITE_OTHER): Payer: Medicare Other | Admitting: Sports Medicine

## 2020-06-03 DIAGNOSIS — M79674 Pain in right toe(s): Secondary | ICD-10-CM

## 2020-06-03 DIAGNOSIS — B351 Tinea unguium: Secondary | ICD-10-CM | POA: Diagnosis not present

## 2020-06-03 DIAGNOSIS — L97521 Non-pressure chronic ulcer of other part of left foot limited to breakdown of skin: Secondary | ICD-10-CM

## 2020-06-03 DIAGNOSIS — M79675 Pain in left toe(s): Secondary | ICD-10-CM | POA: Diagnosis not present

## 2020-06-03 DIAGNOSIS — E1142 Type 2 diabetes mellitus with diabetic polyneuropathy: Secondary | ICD-10-CM | POA: Diagnosis not present

## 2020-06-03 DIAGNOSIS — E11621 Type 2 diabetes mellitus with foot ulcer: Secondary | ICD-10-CM

## 2020-06-03 MED ORDER — LIDOCAINE 5 % EX OINT: TOPICAL_OINTMENT | CUTANEOUS | 0 refills | Status: DC | PRN

## 2020-06-03 NOTE — Progress Notes (Signed)
Subjective: Heidi Dominguez is a 86 y.o. female patient with history of diabetes who returns to office today for wound check and for nail care.  Patient reports that there is a little bit of soreness at the left great toe but otherwise is doing okay except some sharp pain to the toes at night.  Patient is assisted by aide this visit who reports that they have been applying Betadine to the areas at her toes.  Denies any other pedal complaints at this time.   Patient Active Problem List   Diagnosis Date Noted  . Gastroesophageal reflux disease 10/22/2019  . Unspecified chronic bronchitis ([place]) 09/02/2019  . Infection or inflammatory reaction due to internal joint prosthesis ([place]) 02/20/2019  . Rhinitis, chronic 01/07/2019  . Infection of shoulder ([place]) 11/08/2018  . History of hemiarthroplasty of right shoulder 09/25/2018  . UTI (urinary tract infection) 09/12/2018  . Acute pancreatitis 08/28/2018  . Acute recurrent pancreatitis   . Abdominal pain 08/23/2018  . Elevated LFTs 07/04/2018  . CKD (chronic kidney disease) stage 3, GFR 30-59 ml/min (HCC) 07/04/2018  . Upper airway cough syndrome 03/13/2018  . Sensorineural hearing loss (SNHL), bilateral 12/04/2016  . Post-nasal drainage 03/14/2016  . Presbycusis of both ears 03/14/2016  . Bilateral hearing loss 10/06/2015  . Bilateral impacted cerumen 10/06/2015  . 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 Subjective tinnitus of both ears 10/06/2015  . Choledocholithiasis with chronic cholecystitis, nonobstructing 04/11/2015  . Pre-operative cardiovascular examination, high risk surgery   . Epigastric abdominal pain 04/08/2015  . Acute gallstone pancreatitis s/p lap chole w [facility] 04/11/2015 04/08/2015  . Diabetes mellitus type 2, controlled ([place]) 04/08/2015  . Gallstone pancreatitis 04/08/2015  . Mitral regurgitation 04/08/2015  . Chronic diastolic CHF (congestive heart failure) ([place]) 04/08/2015  . DOE (dyspnea on exertion)  03/31/2015  . Ischemic colitis ([place]) 08/09/2012  . Colitis 08/07/2012  . Rectal bleeding 08/07/2012  . Weakness 09/24/2011  . Bradycardia 09/24/2011  . Acute lower UTI 09/24/2011  . Hematochezia 09/24/2011  . Essential hypertension 09/24/2011  . Dyslipidemia 09/24/2011   Current Outpatient Medications on File Prior to Visit  Medication Sig Dispense Refill  . albuterol (PROVENTIL HFA;VENTOLIN HFA) 108 (90 Base) MCG/ACT inhaler Inhale into the lungs every 6 (six) hours as needed for wheezing or shortness of breath.    Marland Kitchen amLODipine (NORVASC) 10 MG tablet TK 1 T PO QD    . aspirin EC 81 MG tablet Take 81 mg by mouth daily.    . Cholecalciferol (VITAMIN D-3) 125 MCG (5000 UT) TABS Take 5,000 Units by mouth daily.    . ciprofloxacin (CIPRO) 500 MG tablet SMARTSIG:1 Tablet(s) By Mouth Every 12 Hours    . doxycycline (VIBRA-TABS) 100 MG tablet Take 1 tablet (100 mg total) by mouth 2 (two) times daily. 20 tablet 0  . erythromycin ophthalmic ointment erythromycin 5 mg/gram (0.5 %) eye ointment  APPLY A SMALL AMOUNT INTO THE RIGHT EYE FOUR TIMES DAILY AS NEEDED FOR PAIN    . famotidine (PEPCID) 20 MG tablet Take one after  supper 30 tablet 2  . fentaNYL (DURAGESIC) 25 MCG/HR APP 1 PA EXT TO THE SKIN Q 72 H    . Ferrous Gluconate (IRON) 240 (27 FE) MG TABS Take 240 mg by mouth daily.     . fluconazole (DIFLUCAN) 100 MG tablet fluconazole 100 mg tablet  TAKE 1 TABLET BY MOUTH EVERY DAY FOR 1 DAY    . furosemide (LASIX) 20 MG tablet furosemide 20  mg tablet    . JANUVIA 50 MG tablet Take 50 mg by mouth daily.   5  . levalbuterol (XOPENEX) 1.25 MG/3ML nebulizer solution 1 vial in neb every 4 hours as needed    . lisinopril (ZESTRIL) 5 MG tablet lisinopril 5 mg tablet  TK 1 T PO QD    . losartan (COZAAR) 50 MG tablet Take 1 tablet (50 mg total) by mouth daily. 30 tablet 11  . Magnesium (V-R MAGNESIUM) 250 MG TABS Take by mouth.    . Multiple Vitamins-Minerals (PRESERVISION AREDS 2) CAPS     .  ONETOUCH VERIO test strip USE TO CHECK BLOOD SUGAR ONCE DAILY.    . pantoprazole (PROTONIX) 40 MG tablet Take 1 tablet (40 mg total) by mouth daily. Take 30-60 min before first meal of the day 30 tablet 5  . potassium chloride (K-DUR) 10 MEQ tablet TK 1 T PO  ONCE A DAY    . SYSTANE COMPLETE 0.6 % SOLN     . UNABLE TO FIND     . UNABLE TO FIND albuterol sulfate HFA 90 mcg/actuation aerosol inhaler  TAKE 2 PUFFS AS NEEDED EVERY 4 HOURS    . vitamin B-12 (CYANOCOBALAMIN) 1000 MCG tablet Take 1,000 mcg by mouth daily.    . vitamin B-12 (CYANOCOBALAMIN) 500 MCG tablet      No current facility-administered medications on file prior to visit.   Allergies  Allergen Reactions  . Catapres [Clonidine Hcl] Other (See Comments)    Unknown. Unknown.  . Codeine Other (See Comments)    "Makes me out of my head, loopy"  . Covera-Hs [Verapamil Hcl] Other (See Comments)    Unknown.  Marland Kitchen Hydrocodone Other (See Comments)    "makes me go out of my head, loopy"  . Other Other (See Comments)    Any type of narcotics Feels "loopy" Any type of narcotics Feels "loopy"  . Sulfa Antibiotics Itching  . Sulfamethoxazole Diarrhea  . Verapamil Diarrhea and Other (See Comments)  . Sulfasalazine Itching    No results found for this or any previous visit (from the past 2160 hour(s)).  Objective: General: Patient is awake, alert, and oriented x 3 and in no acute distress.  Integument: Skin is warm, dry and supple bilateral. Nails are elongated, thickened and dystrophic with subungual debris, consistent with onychomycosis, 1-5 bilateral.  Previous ulceration is healed at left great toe.  Vasculature:  Dorsalis Pedis pulse 1/4 bilateral. Posterior Tibial pulse  0/4 bilateral. Capillary fill time <3 sec 1-5 bilateral. No hair growth to the level of the digits.Temperature gradient within normal limits. + varicosities present bilateral. Trace edema present bilateral.   Neurology: Protective sensation slightly  diminished bilateral.  Subjective sharp shooting pain to all toes.  Musculoskeletal: No pain with palpation to the left first interspace at area of previous ulceration.  + Bunion, hammertoes, pes planus asymptomatic pedal deformities noted bilateral. Muscular strength 4/5 in all lower extremity muscular groups bilateral without pain on range of motion.   Assessment and Plan: Problem List Items Addressed This Visit    None    Visit Diagnoses    Diabetic ulcer of toe of left foot associated with type 2 diabetes mellitus, limited to breakdown of skin ([place])    -  Primary   Resolved   Pain due to onychomycosis of toenails of both feet       Diabetic peripheral neuropathy associated with type 2 diabetes mellitus ([place])          -  Examined patient. -Mechanically debrided nails using a sterile nail nipper without incident -Advised a to apply Betadine for 1 more week then after to the left great toe may discontinue -Prescribed topical lidocaine for patient to use as needed for pain to toes -Continue with good supportive shoes daily for foot type/diabetic shoes -Patient advised to call the office in 9 to 10 weeks for routine nail care or sooner if problems or issues arise.  Landis Martins, DPM

## 2020-06-23 DIAGNOSIS — K219 Gastro-esophageal reflux disease without esophagitis: Secondary | ICD-10-CM | POA: Diagnosis not present

## 2020-06-23 DIAGNOSIS — R1314 Dysphagia, pharyngoesophageal phase: Secondary | ICD-10-CM | POA: Diagnosis not present

## 2020-06-23 DIAGNOSIS — K5901 Slow transit constipation: Secondary | ICD-10-CM | POA: Diagnosis not present

## 2020-06-29 ENCOUNTER — Other Ambulatory Visit: Payer: Self-pay | Admitting: Sports Medicine

## 2020-06-29 NOTE — Telephone Encounter (Signed)
Please advise 

## 2020-07-20 DIAGNOSIS — H353211 Exudative age-related macular degeneration, right eye, with active choroidal neovascularization: Secondary | ICD-10-CM | POA: Diagnosis not present

## 2020-07-28 DIAGNOSIS — S7011XA Contusion of right thigh, initial encounter: Secondary | ICD-10-CM | POA: Diagnosis not present

## 2020-07-28 DIAGNOSIS — W19XXXA Unspecified fall, initial encounter: Secondary | ICD-10-CM | POA: Diagnosis not present

## 2020-07-28 DIAGNOSIS — J069 Acute upper respiratory infection, unspecified: Secondary | ICD-10-CM | POA: Diagnosis not present

## 2020-08-03 ENCOUNTER — Other Ambulatory Visit: Payer: Self-pay | Admitting: Internal Medicine

## 2020-08-03 MED ORDER — LOSARTAN POTASSIUM 50 MG PO TABS: 50.0000 mg | ORAL_TABLET | Freq: Every day | ORAL | 0 refills | Status: DC

## 2020-08-05 ENCOUNTER — Other Ambulatory Visit: Payer: Self-pay

## 2020-08-05 ENCOUNTER — Encounter: Payer: Self-pay | Admitting: Sports Medicine

## 2020-08-05 ENCOUNTER — Ambulatory Visit (INDEPENDENT_AMBULATORY_CARE_PROVIDER_SITE_OTHER): Payer: Medicare Other | Admitting: Sports Medicine

## 2020-08-05 DIAGNOSIS — M79674 Pain in right toe(s): Secondary | ICD-10-CM

## 2020-08-05 DIAGNOSIS — E0843 Diabetes mellitus due to underlying condition with diabetic autonomic (poly)neuropathy: Secondary | ICD-10-CM

## 2020-08-05 DIAGNOSIS — M79675 Pain in left toe(s): Secondary | ICD-10-CM | POA: Diagnosis not present

## 2020-08-05 DIAGNOSIS — L97521 Non-pressure chronic ulcer of other part of left foot limited to breakdown of skin: Secondary | ICD-10-CM

## 2020-08-05 DIAGNOSIS — M2550 Pain in unspecified joint: Secondary | ICD-10-CM

## 2020-08-05 DIAGNOSIS — E11621 Type 2 diabetes mellitus with foot ulcer: Secondary | ICD-10-CM

## 2020-08-05 DIAGNOSIS — E1142 Type 2 diabetes mellitus with diabetic polyneuropathy: Secondary | ICD-10-CM | POA: Diagnosis not present

## 2020-08-05 DIAGNOSIS — B351 Tinea unguium: Secondary | ICD-10-CM | POA: Diagnosis not present

## 2020-08-05 MED ORDER — COLCHICINE 0.6 MG PO TABS: 0.6000 mg | ORAL_TABLET | Freq: Every day | ORAL | 0 refills | Status: DC

## 2020-08-05 MED ORDER — AMOXICILLIN-POT CLAVULANATE 500-125 MG PO TABS: 1.0000 | ORAL_TABLET | Freq: Three times a day (TID) | ORAL | 0 refills | Status: DC

## 2020-08-05 NOTE — Progress Notes (Signed)
Subjective: Heidi Dominguez is a 86 y.o. female patient with history of diabetes who returns to office today for wound check and for nail care.  Patient reports that her left 1st toe has gotten worse and hurts really bad at night with pain in all joints of feet. Has been using iodine solution but still hurts at 1st toe as well. Denies nausea, vomiting, fever, chills, or any other symptoms at this time.   Patient Active Problem List   Diagnosis Date Noted  . Gastroesophageal reflux disease 10/22/2019  . Unspecified chronic bronchitis ([place]) 09/02/2019  . Infection or inflammatory reaction due to internal joint prosthesis ([place]) 02/20/2019  . Rhinitis, chronic 01/07/2019  . Infection of shoulder ([place]) 11/08/2018  . History of hemiarthroplasty of right shoulder 09/25/2018  . UTI (urinary tract infection) 09/12/2018  . Acute pancreatitis 08/28/2018  . Acute recurrent pancreatitis   . Abdominal pain 08/23/2018  . Elevated LFTs 07/04/2018  . CKD (chronic kidney disease) stage 3, GFR 30-59 ml/min (HCC) 07/04/2018  . Upper airway cough syndrome 03/13/2018  . Sensorineural hearing loss (SNHL), bilateral 12/04/2016  . Post-nasal drainage 03/14/2016  . Presbycusis of both ears 03/14/2016  . Bilateral hearing loss 10/06/2015  . Bilateral impacted cerumen 10/06/2015  . 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 Subjective tinnitus of both ears 10/06/2015  . Choledocholithiasis with chronic cholecystitis, nonobstructing 04/11/2015  . Pre-operative cardiovascular examination, high risk surgery   . Epigastric abdominal pain 04/08/2015  . Acute gallstone pancreatitis s/p lap chole w [facility] 04/11/2015 04/08/2015  . Diabetes mellitus type 2, controlled ([place]) 04/08/2015  . Gallstone pancreatitis 04/08/2015  . Mitral regurgitation 04/08/2015  . Chronic diastolic CHF (congestive heart failure) ([place]) 04/08/2015  . DOE (dyspnea on exertion) 03/31/2015  . Ischemic colitis ([place]) 08/09/2012  .  Colitis 08/07/2012  . Rectal bleeding 08/07/2012  . Weakness 09/24/2011  . Bradycardia 09/24/2011  . Acute lower UTI 09/24/2011  . Hematochezia 09/24/2011  . Essential hypertension 09/24/2011  . Dyslipidemia 09/24/2011   Current Outpatient Medications on File Prior to Visit  Medication Sig Dispense Refill  . albuterol (PROVENTIL HFA;VENTOLIN HFA) 108 (90 Base) MCG/ACT inhaler Inhale into the lungs every 6 (six) hours as needed for wheezing or shortness of breath.    Marland Kitchen amLODipine (NORVASC) 10 MG tablet TK 1 T PO QD    . aspirin EC 81 MG tablet Take 81 mg by mouth daily.    . Cholecalciferol (VITAMIN D-3) 125 MCG (5000 UT) TABS Take 5,000 Units by mouth daily.    . ciprofloxacin (CIPRO) 500 MG tablet SMARTSIG:1 Tablet(s) By Mouth Every 12 Hours    . doxycycline (VIBRA-TABS) 100 MG tablet Take 1 tablet (100 mg total) by mouth 2 (two) times daily. 20 tablet 0  . erythromycin ophthalmic ointment erythromycin 5 mg/gram (0.5 %) eye ointment  APPLY A SMALL AMOUNT INTO THE RIGHT EYE FOUR TIMES DAILY AS NEEDED FOR PAIN    . famotidine (PEPCID) 20 MG tablet Take one after  supper 30 tablet 2  . fentaNYL (DURAGESIC) 25 MCG/HR APP 1 PA EXT TO THE SKIN Q 72 H    . Ferrous Gluconate (IRON) 240 (27 FE) MG TABS Take 240 mg by mouth daily.     . fluconazole (DIFLUCAN) 100 MG tablet fluconazole 100 mg tablet  TAKE 1 TABLET BY MOUTH EVERY DAY FOR 1 DAY    . furosemide (LASIX) 20 MG tablet furosemide 20 mg tablet    . JANUVIA 50 MG tablet Take  50 mg by mouth daily.   5  . levalbuterol (XOPENEX) 1.25 MG/3ML nebulizer solution 1 vial in neb every 4 hours as needed    . lidocaine (XYLOCAINE) 5 % ointment APPLY TOPICALLY AS NEEDED. TOES FOR PAIN 35.44 g 0  . lisinopril (ZESTRIL) 5 MG tablet lisinopril 5 mg tablet  TK 1 T PO QD    . losartan (COZAAR) 50 MG tablet Take 1 tablet (50 mg total) by mouth daily. 30 tablet 0  . Magnesium (V-R MAGNESIUM) 250 MG TABS Take by mouth.    . Multiple Vitamins-Minerals  (PRESERVISION AREDS 2) CAPS     . ONETOUCH VERIO test strip USE TO CHECK BLOOD SUGAR ONCE DAILY.    . pantoprazole (PROTONIX) 40 MG tablet Take 1 tablet (40 mg total) by mouth daily. Take 30-60 min before first meal of the day 30 tablet 5  . potassium chloride (K-DUR) 10 MEQ tablet TK 1 T PO  ONCE A DAY    . SYSTANE COMPLETE 0.6 % SOLN     . UNABLE TO FIND     . UNABLE TO FIND albuterol sulfate HFA 90 mcg/actuation aerosol inhaler  TAKE 2 PUFFS AS NEEDED EVERY 4 HOURS    . vitamin B-12 (CYANOCOBALAMIN) 1000 MCG tablet Take 1,000 mcg by mouth daily.    . vitamin B-12 (CYANOCOBALAMIN) 500 MCG tablet      No current facility-administered medications on file prior to visit.   Allergies  Allergen Reactions  . Catapres [Clonidine Hcl] Other (See Comments)    Unknown. Unknown.  . Codeine Other (See Comments)    "Makes me out of my head, loopy"  . Covera-Hs [Verapamil Hcl] Other (See Comments)    Unknown.  Marland Kitchen Hydrocodone Other (See Comments)    "makes me go out of my head, loopy"  . Other Other (See Comments)    Any type of narcotics Feels "loopy" Any type of narcotics Feels "loopy"  . Sulfa Antibiotics Itching  . Sulfamethoxazole Diarrhea  . Verapamil Diarrhea and Other (See Comments)  . Sulfasalazine Itching    No results found for this or any previous visit (from the past 2160 hour(s)).  Objective: General: Patient is awake, alert, and oriented x 3 and in no acute distress.  Integument: Skin is warm, dry and supple bilateral. Nails are elongated, thickened and dystrophic with subungual debris, consistent with onychomycosis, 1-5 bilateral.  Re-opened ulceration at left 1st toe that measures 1x0.5cm with fatty tissue exposed at medial hallux with surrounding swelling, redness, warmth, active bloody drainage, No odor.   Vasculature:  Dorsalis Pedis pulse 1/4 bilateral. Posterior Tibial pulse  0/4 bilateral. Capillary fill time <3 sec 1-5 bilateral. No hair growth to the level of the  digits.Temperature gradient within normal limits. + varicosities present bilateral. Trace edema present bilateral.   Neurology: Protective sensation slightly diminished bilateral.  Subjective sharp shooting pain to all toes.  Musculoskeletal:+ pain to left 1st toe at ulceration.  + Bunion, hammertoes, pes planus asymptomatic pedal deformities noted bilateral. Muscular strength 4/5 in all lower extremity muscular groups bilateral without pain on range of motion.   Assessment and Plan: Problem List Items Addressed This Visit   None   Visit Diagnoses    Diabetic ulcer of toe of left foot associated with type 2 diabetes mellitus, limited to breakdown of skin ([place])    -  Primary   Pain due to onychomycosis of toenails of both feet       Diabetic peripheral neuropathy associated with  type 2 diabetes mellitus ([place])       Diabetes mellitus due to underlying condition with diabetic autonomic neuropathy, unspecified whether long term insulin use (HCC)       Arthralgia, unspecified joint         -Examined patient. -Excisionally dedbrided ulceration at left 1st toe to healthy bleeding borders removing nonviable tissue using a tissue nipper, Wound measures post debridement as above, Wound was debrided to the level of the dermis with viable wound base exposed to promote healing. Hemostasis was achieved with manuel pressure. Patient tolerated procedure well without any discomfort or anesthesia necessary for this wound debridement.  -Applied iodosorb and dry sterile dressing and instructed patient to continue with daily dressings at home consisting of the same with help from home aide -Rx Augmentin -Rx Colchicine for joint pains -Dispensed surgical shoe -Advised patient to go to the ER or return to office if the wound worsens or if constitutional symptoms are present. -Mechanically debrided nails using a sterile nail nipper without incident -Return in 2 weeks for wound care.  Landis Martins, DPM

## 2020-08-09 ENCOUNTER — Telehealth: Payer: Self-pay | Admitting: Sports Medicine

## 2020-08-09 NOTE — Telephone Encounter (Signed)
Pt daughter called and stated that the medication that was prescribed on 12/23 is causing Heidi Dominguez to have major diarrhea. She wanted to know the side affects of each medication and what is needed to be done. She doesn't want to stop taking medication because shew doesn't want an infection. Please call patient.

## 2020-08-09 NOTE — Telephone Encounter (Signed)
Diarrhea can be a side effect of oral antibiotics. She needs to take the medication with plain greek yogurt or take an OTC probiotic and or OTC anti-diarrheal medication to help. If diarrhea does not improve after 48 hours call office back for me to change the antibiotic to a different one but this is a common side effect and can happen with all antibiotics.

## 2020-08-10 ENCOUNTER — Telehealth: Payer: Self-pay

## 2020-08-10 ENCOUNTER — Other Ambulatory Visit: Payer: Self-pay | Admitting: Sports Medicine

## 2020-08-10 MED ORDER — DOXYCYCLINE HYCLATE 100 MG PO TABS: 100.0000 mg | ORAL_TABLET | Freq: Two times a day (BID) | ORAL | 0 refills | Status: DC

## 2020-08-10 NOTE — Telephone Encounter (Signed)
I spoke with the patient's daughter, [name], and advised to stop the augmentin that we prescribed and to start the doxycycline. Patient takes Cipro daily as prescribed by her orthopedic doctor for her arm. I explained that the colchicine is for her joint pain. She will call back with any further questions or problems Thanks

## 2020-08-10 NOTE — Telephone Encounter (Signed)
Left message for patients daughter about med change

## 2020-08-10 NOTE — Telephone Encounter (Signed)
Changed Cipro to Doxycyline

## 2020-08-10 NOTE — Telephone Encounter (Signed)
Thank you :)

## 2020-08-10 NOTE — Telephone Encounter (Signed)
Pt daughter called back and stated that her mother is going to the bathroom 10 times a day and it making her weak. She would like to go ahead and change the medication. Daughter is in another city and Heidi Dominguez has a care giver.

## 2020-08-10 NOTE — Progress Notes (Signed)
Changed antibotic to Doxcycline  -Dr. Marylene Land

## 2020-08-18 DIAGNOSIS — E119 Type 2 diabetes mellitus without complications: Secondary | ICD-10-CM | POA: Diagnosis not present

## 2020-08-18 DIAGNOSIS — E1140 Type 2 diabetes mellitus with diabetic neuropathy, unspecified: Secondary | ICD-10-CM | POA: Diagnosis not present

## 2020-08-18 DIAGNOSIS — E7800 Pure hypercholesterolemia, unspecified: Secondary | ICD-10-CM | POA: Diagnosis not present

## 2020-08-18 DIAGNOSIS — R197 Diarrhea, unspecified: Secondary | ICD-10-CM | POA: Diagnosis not present

## 2020-08-18 DIAGNOSIS — K219 Gastro-esophageal reflux disease without esophagitis: Secondary | ICD-10-CM | POA: Diagnosis not present

## 2020-08-18 DIAGNOSIS — D696 Thrombocytopenia, unspecified: Secondary | ICD-10-CM | POA: Diagnosis not present

## 2020-08-18 DIAGNOSIS — E11319 Type 2 diabetes mellitus with unspecified diabetic retinopathy without macular edema: Secondary | ICD-10-CM | POA: Diagnosis not present

## 2020-08-18 DIAGNOSIS — Z79899 Other long term (current) drug therapy: Secondary | ICD-10-CM | POA: Diagnosis not present

## 2020-08-18 DIAGNOSIS — I340 Nonrheumatic mitral (valve) insufficiency: Secondary | ICD-10-CM | POA: Diagnosis not present

## 2020-08-18 DIAGNOSIS — I10 Essential (primary) hypertension: Secondary | ICD-10-CM | POA: Diagnosis not present

## 2020-08-18 DIAGNOSIS — E11621 Type 2 diabetes mellitus with foot ulcer: Secondary | ICD-10-CM | POA: Diagnosis not present

## 2020-08-18 DIAGNOSIS — I700 Atherosclerosis of aorta: Secondary | ICD-10-CM | POA: Diagnosis not present

## 2020-08-18 DIAGNOSIS — E113293 Type 2 diabetes mellitus with mild nonproliferative diabetic retinopathy without macular edema, bilateral: Secondary | ICD-10-CM | POA: Diagnosis not present

## 2020-08-18 DIAGNOSIS — I5032 Chronic diastolic (congestive) heart failure: Secondary | ICD-10-CM | POA: Diagnosis not present

## 2020-08-19 ENCOUNTER — Ambulatory Visit (INDEPENDENT_AMBULATORY_CARE_PROVIDER_SITE_OTHER): Payer: Medicare Other | Admitting: Sports Medicine

## 2020-08-19 ENCOUNTER — Other Ambulatory Visit: Payer: Self-pay

## 2020-08-19 ENCOUNTER — Encounter: Payer: Self-pay | Admitting: Sports Medicine

## 2020-08-19 DIAGNOSIS — L97521 Non-pressure chronic ulcer of other part of left foot limited to breakdown of skin: Secondary | ICD-10-CM

## 2020-08-19 DIAGNOSIS — M2550 Pain in unspecified joint: Secondary | ICD-10-CM

## 2020-08-19 DIAGNOSIS — E1142 Type 2 diabetes mellitus with diabetic polyneuropathy: Secondary | ICD-10-CM

## 2020-08-19 DIAGNOSIS — E11621 Type 2 diabetes mellitus with foot ulcer: Secondary | ICD-10-CM

## 2020-08-19 NOTE — Progress Notes (Signed)
Subjective: Heidi Dominguez is a 87 y.o. female patient with history of diabetes who returns to office today for wound check.Patient is assisted by aide and reports that its doing better and has a few more days of antibiotics left. Denies nausea, vomiting, fever, chills, or any other symptoms at this time.   Patient Active Problem List   Diagnosis Date Noted  . Gastroesophageal reflux disease 10/22/2019  . Unspecified chronic bronchitis ([place]) 09/02/2019  . Infection or inflammatory reaction due to internal joint prosthesis ([place]) 02/20/2019  . Rhinitis, chronic 01/07/2019  . Infection of shoulder ([place]) 11/08/2018  . History of hemiarthroplasty of right shoulder 09/25/2018  . UTI (urinary tract infection) 09/12/2018  . Acute pancreatitis 08/28/2018  . Acute recurrent pancreatitis   . Abdominal pain 08/23/2018  . Elevated LFTs 07/04/2018  . CKD (chronic kidney disease) stage 3, GFR 30-59 ml/min (HCC) 07/04/2018  . Upper airway cough syndrome 03/13/2018  . Sensorineural hearing loss (SNHL), bilateral 12/04/2016  . Post-nasal drainage 03/14/2016  . Presbycusis of both ears 03/14/2016  . Bilateral hearing loss 10/06/2015  . Bilateral impacted cerumen 10/06/2015  . 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 Subjective tinnitus of both ears 10/06/2015  . Choledocholithiasis with chronic cholecystitis, nonobstructing 04/11/2015  . Pre-operative cardiovascular examination, high risk surgery   . Epigastric abdominal pain 04/08/2015  . Acute gallstone pancreatitis s/p lap chole w [facility] 04/11/2015 04/08/2015  . Diabetes mellitus type 2, controlled ([place]) 04/08/2015  . Gallstone pancreatitis 04/08/2015  . Mitral regurgitation 04/08/2015  . Chronic diastolic CHF (congestive heart failure) ([place]) 04/08/2015  . DOE (dyspnea on exertion) 03/31/2015  . Ischemic colitis ([place]) 08/09/2012  . Colitis 08/07/2012  . Rectal bleeding 08/07/2012  . Weakness 09/24/2011  . Bradycardia 09/24/2011   . Acute lower UTI 09/24/2011  . Hematochezia 09/24/2011  . Essential hypertension 09/24/2011  . Dyslipidemia 09/24/2011   Current Outpatient Medications on File Prior to Visit  Medication Sig Dispense Refill  . albuterol (PROVENTIL HFA;VENTOLIN HFA) 108 (90 Base) MCG/ACT inhaler Inhale into the lungs every 6 (six) hours as needed for wheezing or shortness of breath.    Marland Kitchen amLODipine (NORVASC) 10 MG tablet TK 1 T PO QD    . amoxicillin-clavulanate (AUGMENTIN) 500-125 MG tablet Take 1 tablet (500 mg total) by mouth 3 (three) times daily. 28 tablet 0  . aspirin EC 81 MG tablet Take 81 mg by mouth daily.    . Cholecalciferol (VITAMIN D-3) 125 MCG (5000 UT) TABS Take 5,000 Units by mouth daily.    . ciprofloxacin (CIPRO) 500 MG tablet SMARTSIG:1 Tablet(s) By Mouth Every 12 Hours    . colchicine 0.6 MG tablet Take 1 tablet (0.6 mg total) by mouth daily. 14 tablet 0  . doxycycline (VIBRA-TABS) 100 MG tablet Take 1 tablet (100 mg total) by mouth 2 (two) times daily. 20 tablet 0  . erythromycin ophthalmic ointment erythromycin 5 mg/gram (0.5 %) eye ointment  APPLY A SMALL AMOUNT INTO THE RIGHT EYE FOUR TIMES DAILY AS NEEDED FOR PAIN    . famotidine (PEPCID) 20 MG tablet Take one after  supper 30 tablet 2  . fentaNYL (DURAGESIC) 25 MCG/HR APP 1 PA EXT TO THE SKIN Q 72 H    . Ferrous Gluconate (IRON) 240 (27 FE) MG TABS Take 240 mg by mouth daily.     . fluconazole (DIFLUCAN) 100 MG tablet fluconazole 100 mg tablet  TAKE 1 TABLET BY MOUTH EVERY DAY FOR 1 DAY    .  furosemide (LASIX) 20 MG tablet furosemide 20 mg tablet    . JANUVIA 50 MG tablet Take 50 mg by mouth daily.   5  . levalbuterol (XOPENEX) 1.25 MG/3ML nebulizer solution 1 vial in neb every 4 hours as needed    . lidocaine (XYLOCAINE) 5 % ointment APPLY TOPICALLY AS NEEDED. TOES FOR PAIN 35.44 g 0  . lisinopril (ZESTRIL) 5 MG tablet lisinopril 5 mg tablet  TK 1 T PO QD    . losartan (COZAAR) 50 MG tablet Take 1 tablet (50 mg total) by  mouth daily. 30 tablet 0  . Magnesium (V-R MAGNESIUM) 250 MG TABS Take by mouth.    . Multiple Vitamins-Minerals (PRESERVISION AREDS 2) CAPS     . ONETOUCH VERIO test strip USE TO CHECK BLOOD SUGAR ONCE DAILY.    . pantoprazole (PROTONIX) 40 MG tablet Take 1 tablet (40 mg total) by mouth daily. Take 30-60 min before first meal of the day 30 tablet 5  . potassium chloride (K-DUR) 10 MEQ tablet TK 1 T PO  ONCE A DAY    . SYSTANE COMPLETE 0.6 % SOLN     . UNABLE TO FIND     . UNABLE TO FIND albuterol sulfate HFA 90 mcg/actuation aerosol inhaler  TAKE 2 PUFFS AS NEEDED EVERY 4 HOURS    . vitamin B-12 (CYANOCOBALAMIN) 1000 MCG tablet Take 1,000 mcg by mouth daily.    . vitamin B-12 (CYANOCOBALAMIN) 500 MCG tablet      No current facility-administered medications on file prior to visit.   Allergies  Allergen Reactions  . Catapres [Clonidine Hcl] Other (See Comments)    Unknown. Unknown.  . Codeine Other (See Comments)    "Makes me out of my head, loopy"  . Covera-Hs [Verapamil Hcl] Other (See Comments)    Unknown.  Marland Kitchen Hydrocodone Other (See Comments)    "makes me go out of my head, loopy"  . Other Other (See Comments)    Any type of narcotics Feels "loopy" Any type of narcotics Feels "loopy"  . Sulfa Antibiotics Itching  . Sulfamethoxazole Diarrhea  . Verapamil Diarrhea and Other (See Comments)  . Sulfasalazine Itching    No results found for this or any previous visit (from the past 2160 hour(s)).  Objective: General: Patient is awake, alert, and oriented x 3 and in no acute distress.  Integument: Skin is warm, dry and supple bilateral. Nails are short and dystrophic with subungual debris, consistent with onychomycosis, 1-5 bilateral. +ulceration at left 1st toe that measures 0.5x0.5cm with fatty tissue exposed at medial hallux with no swelling, resolved redness, decreased warmth, active bloody drainage, No odor.   Vasculature:  Dorsalis Pedis pulse 1/4 bilateral. Posterior  Tibial pulse  0/4 bilateral. Capillary fill time <3 sec 1-5 bilateral. No hair growth to the level of the digits.Temperature gradient within normal limits. + varicosities present bilateral. Trace edema present bilateral.   Neurology: Protective sensation slightly diminished bilateral.  Subjective sharp shooting pain to all toes that is a little better.  Musculoskeletal:+ decreased pain to left 1st toe at ulceration.  + Bunion, hammertoes, pes planus asymptomatic pedal deformities noted bilateral. Muscular strength 4/5 in all lower extremity muscular groups bilateral without pain on range of motion.   Assessment and Plan: Problem List Items Addressed This Visit   None   Visit Diagnoses    Diabetic ulcer of toe of left foot associated with type 2 diabetes mellitus, limited to breakdown of skin (HCC)    -  Primary   Diabetic peripheral neuropathy associated with type 2 diabetes mellitus (HCC)       Arthralgia, unspecified joint         -Examined patient. -Excisionally dedbrided ulceration at left 1st toe to healthy bleeding borders removing nonviable tissue using a tissue nipper, Wound measures post debridement as above, Wound was debrided to the level of the dermis with viable wound base exposed to promote healing. Hemostasis was achieved with manuel pressure. Patient tolerated procedure well without any discomfort or anesthesia necessary for this wound debridement.  -Applied iodosorb and dry sterile dressing and instructed patient to continue with daily dressings at home consisting of the same with help from home aide -Continue with antibiotics until completed -Continue with diabetic shoes since post op shoe makes her feel unsteady -Advised patient to go to the ER or return to office if the wound worsens or if constitutional symptoms are present. -Return in 2 weeks for wound care.  Asencion Islam, DPM

## 2020-08-24 DIAGNOSIS — K219 Gastro-esophageal reflux disease without esophagitis: Secondary | ICD-10-CM | POA: Diagnosis not present

## 2020-08-24 DIAGNOSIS — E11621 Type 2 diabetes mellitus with foot ulcer: Secondary | ICD-10-CM | POA: Diagnosis not present

## 2020-08-24 DIAGNOSIS — L97529 Non-pressure chronic ulcer of other part of left foot with unspecified severity: Secondary | ICD-10-CM | POA: Diagnosis not present

## 2020-08-24 DIAGNOSIS — D696 Thrombocytopenia, unspecified: Secondary | ICD-10-CM | POA: Diagnosis not present

## 2020-08-24 DIAGNOSIS — E1140 Type 2 diabetes mellitus with diabetic neuropathy, unspecified: Secondary | ICD-10-CM | POA: Diagnosis not present

## 2020-08-24 DIAGNOSIS — I110 Hypertensive heart disease with heart failure: Secondary | ICD-10-CM | POA: Diagnosis not present

## 2020-08-24 DIAGNOSIS — Z7982 Long term (current) use of aspirin: Secondary | ICD-10-CM | POA: Diagnosis not present

## 2020-08-24 DIAGNOSIS — Z741 Need for assistance with personal care: Secondary | ICD-10-CM | POA: Diagnosis not present

## 2020-08-24 DIAGNOSIS — I5032 Chronic diastolic (congestive) heart failure: Secondary | ICD-10-CM | POA: Diagnosis not present

## 2020-08-24 DIAGNOSIS — I700 Atherosclerosis of aorta: Secondary | ICD-10-CM | POA: Diagnosis not present

## 2020-08-24 DIAGNOSIS — Z79899 Other long term (current) drug therapy: Secondary | ICD-10-CM | POA: Diagnosis not present

## 2020-08-24 DIAGNOSIS — Z9181 History of falling: Secondary | ICD-10-CM | POA: Diagnosis not present

## 2020-08-24 DIAGNOSIS — R269 Unspecified abnormalities of gait and mobility: Secondary | ICD-10-CM | POA: Diagnosis not present

## 2020-08-24 DIAGNOSIS — I340 Nonrheumatic mitral (valve) insufficiency: Secondary | ICD-10-CM | POA: Diagnosis not present

## 2020-08-24 DIAGNOSIS — E113293 Type 2 diabetes mellitus with mild nonproliferative diabetic retinopathy without macular edema, bilateral: Secondary | ICD-10-CM | POA: Diagnosis not present

## 2020-08-25 DIAGNOSIS — E1140 Type 2 diabetes mellitus with diabetic neuropathy, unspecified: Secondary | ICD-10-CM | POA: Diagnosis not present

## 2020-08-25 DIAGNOSIS — L97529 Non-pressure chronic ulcer of other part of left foot with unspecified severity: Secondary | ICD-10-CM | POA: Diagnosis not present

## 2020-08-25 DIAGNOSIS — E113293 Type 2 diabetes mellitus with mild nonproliferative diabetic retinopathy without macular edema, bilateral: Secondary | ICD-10-CM | POA: Diagnosis not present

## 2020-08-25 DIAGNOSIS — I110 Hypertensive heart disease with heart failure: Secondary | ICD-10-CM | POA: Diagnosis not present

## 2020-08-25 DIAGNOSIS — I5032 Chronic diastolic (congestive) heart failure: Secondary | ICD-10-CM | POA: Diagnosis not present

## 2020-08-25 DIAGNOSIS — E11621 Type 2 diabetes mellitus with foot ulcer: Secondary | ICD-10-CM | POA: Diagnosis not present

## 2020-09-01 DIAGNOSIS — L97529 Non-pressure chronic ulcer of other part of left foot with unspecified severity: Secondary | ICD-10-CM | POA: Diagnosis not present

## 2020-09-01 DIAGNOSIS — I5032 Chronic diastolic (congestive) heart failure: Secondary | ICD-10-CM | POA: Diagnosis not present

## 2020-09-01 DIAGNOSIS — E113293 Type 2 diabetes mellitus with mild nonproliferative diabetic retinopathy without macular edema, bilateral: Secondary | ICD-10-CM | POA: Diagnosis not present

## 2020-09-01 DIAGNOSIS — E1140 Type 2 diabetes mellitus with diabetic neuropathy, unspecified: Secondary | ICD-10-CM | POA: Diagnosis not present

## 2020-09-01 DIAGNOSIS — I110 Hypertensive heart disease with heart failure: Secondary | ICD-10-CM | POA: Diagnosis not present

## 2020-09-01 DIAGNOSIS — E11621 Type 2 diabetes mellitus with foot ulcer: Secondary | ICD-10-CM | POA: Diagnosis not present

## 2020-09-02 ENCOUNTER — Ambulatory Visit (INDEPENDENT_AMBULATORY_CARE_PROVIDER_SITE_OTHER): Payer: Medicare Other | Admitting: Sports Medicine

## 2020-09-02 ENCOUNTER — Encounter: Payer: Self-pay | Admitting: Sports Medicine

## 2020-09-02 ENCOUNTER — Other Ambulatory Visit: Payer: Self-pay

## 2020-09-02 DIAGNOSIS — E1142 Type 2 diabetes mellitus with diabetic polyneuropathy: Secondary | ICD-10-CM

## 2020-09-02 DIAGNOSIS — I700 Atherosclerosis of aorta: Secondary | ICD-10-CM | POA: Insufficient documentation

## 2020-09-02 DIAGNOSIS — D696 Thrombocytopenia, unspecified: Secondary | ICD-10-CM | POA: Insufficient documentation

## 2020-09-02 DIAGNOSIS — E1140 Type 2 diabetes mellitus with diabetic neuropathy, unspecified: Secondary | ICD-10-CM | POA: Insufficient documentation

## 2020-09-02 DIAGNOSIS — E11621 Type 2 diabetes mellitus with foot ulcer: Secondary | ICD-10-CM

## 2020-09-02 DIAGNOSIS — E11319 Type 2 diabetes mellitus with unspecified diabetic retinopathy without macular edema: Secondary | ICD-10-CM | POA: Insufficient documentation

## 2020-09-02 DIAGNOSIS — L97521 Non-pressure chronic ulcer of other part of left foot limited to breakdown of skin: Secondary | ICD-10-CM | POA: Diagnosis not present

## 2020-09-02 DIAGNOSIS — E7800 Pure hypercholesterolemia, unspecified: Secondary | ICD-10-CM | POA: Insufficient documentation

## 2020-09-02 DIAGNOSIS — R269 Unspecified abnormalities of gait and mobility: Secondary | ICD-10-CM | POA: Insufficient documentation

## 2020-09-02 NOTE — Progress Notes (Signed)
Subjective: Heidi Dominguez is a 87 y.o. female patient with history of diabetes who returns to office today for wound check.Patient is assisted by aide and reports that its doing better with 1-2 episiode of bleeding from left toe. Denies nausea, vomiting, fever, chills, or any other symptoms at this time.   Patient Active Problem List   Diagnosis Date Noted  . Diabetic foot ulcer ([place]) 09/02/2020  . Diabetic neuropathy ([place]) 09/02/2020  . Gait abnormality 09/02/2020  . Diabetic retinopathy associated with type 2 diabetes mellitus ([place]) 09/02/2020  . Hardening of the aorta (main artery of the heart) ([place]) 09/02/2020  . Pure hypercholesterolemia 09/02/2020  . Thrombocytopenia ([place]) 09/02/2020  . Gastroesophageal reflux disease 10/22/2019  . Unspecified chronic bronchitis ([place]) 09/02/2019  . Infection or inflammatory reaction due to internal joint prosthesis ([place]) 02/20/2019  . Rhinitis, chronic 01/07/2019  . Infection of shoulder ([place]) 11/08/2018  . History of hemiarthroplasty of right shoulder 09/25/2018  . UTI (urinary tract infection) 09/12/2018  . Acute pancreatitis 08/28/2018  . Acute recurrent pancreatitis   . Abdominal pain 08/23/2018  . Elevated LFTs 07/04/2018  . CKD (chronic kidney disease) stage 3, GFR 30-59 ml/min (HCC) 07/04/2018  . Upper airway cough syndrome 03/13/2018  . Sensorineural hearing loss (SNHL), bilateral 12/04/2016  . Post-nasal drainage 03/14/2016  . Presbycusis of both ears 03/14/2016  . Bilateral hearing loss 10/06/2015  . Bilateral impacted cerumen 10/06/2015  . 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 Subjective tinnitus of both ears 10/06/2015  . Choledocholithiasis with chronic cholecystitis, nonobstructing 04/11/2015  . Pre-operative cardiovascular examination, high risk surgery   . Epigastric abdominal pain 04/08/2015  . Acute gallstone pancreatitis s/p lap chole w [facility] 04/11/2015 04/08/2015  . Diabetes mellitus type 2,  controlled ([place]) 04/08/2015  . Gallstone pancreatitis 04/08/2015  . Mitral regurgitation 04/08/2015  . Chronic diastolic CHF (congestive heart failure) ([place]) 04/08/2015  . DOE (dyspnea on exertion) 03/31/2015  . Ischemic colitis ([place]) 08/09/2012  . Colitis 08/07/2012  . Rectal bleeding 08/07/2012  . Weakness 09/24/2011  . Bradycardia 09/24/2011  . Acute lower UTI 09/24/2011  . Hematochezia 09/24/2011  . Essential hypertension 09/24/2011  . Dyslipidemia 09/24/2011   Current Outpatient Medications on File Prior to Visit  Medication Sig Dispense Refill  . albuterol (PROVENTIL HFA;VENTOLIN HFA) 108 (90 Base) MCG/ACT inhaler Inhale into the lungs every 6 (six) hours as needed for wheezing or shortness of breath.    Marland Kitchen amLODipine (NORVASC) 10 MG tablet TK 1 T PO QD    . amoxicillin-clavulanate (AUGMENTIN) 500-125 MG tablet Take 1 tablet (500 mg total) by mouth 3 (three) times daily. 28 tablet 0  . aspirin EC 81 MG tablet Take 81 mg by mouth daily.    . budesonide-formoterol (SYMBICORT) 160-4.5 MCG/ACT inhaler 2 puffs    . Carboxymethylcellulose Sod PF (REFRESH PLUS) 0.5 % SOLN See admin instructions.    . Cholecalciferol (VITAMIN D-3) 125 MCG (5000 UT) TABS Take 5,000 Units by mouth daily.    . ciprofloxacin (CIPRO) 500 MG tablet SMARTSIG:1 Tablet(s) By Mouth Every 12 Hours    . colchicine 0.6 MG tablet Take 1 tablet (0.6 mg total) by mouth daily. 14 tablet 0  . doxycycline (VIBRA-TABS) 100 MG tablet Take 1 tablet (100 mg total) by mouth 2 (two) times daily. 20 tablet 0  . erythromycin ophthalmic ointment erythromycin 5 mg/gram (0.5 %) eye ointment  APPLY A SMALL AMOUNT INTO THE RIGHT EYE FOUR TIMES DAILY AS NEEDED FOR PAIN    .  famotidine (PEPCID) 20 MG tablet Take one after  supper 30 tablet 2  . fentaNYL (DURAGESIC) 25 MCG/HR APP 1 PA EXT TO THE SKIN Q 72 H    . Ferrous Gluconate (IRON) 240 (27 FE) MG TABS Take 240 mg by mouth daily.     . fluconazole (DIFLUCAN) 100 MG tablet fluconazole  100 mg tablet  TAKE 1 TABLET BY MOUTH EVERY DAY FOR 1 DAY    . furosemide (LASIX) 20 MG tablet furosemide 20 mg tablet    . hydrocortisone (ANUSOL-HC) 2.5 % rectal cream Apply topically 2 (two) times daily.    . hydrocortisone 2.5 % cream hydrocortisone 2.5 % topical cream with perineal applicator    . ibuprofen (ADVIL) 200 MG tablet 1 tablet with food or milk as needed    . JANUVIA 50 MG tablet Take 50 mg by mouth daily.   5  . levalbuterol (XOPENEX) 1.25 MG/3ML nebulizer solution 1 vial in neb every 4 hours as needed    . lidocaine (XYLOCAINE) 5 % ointment APPLY TOPICALLY AS NEEDED. TOES FOR PAIN 35.44 g 0  . lisinopril (ZESTRIL) 5 MG tablet lisinopril 5 mg tablet  TK 1 T PO QD    . loratadine (CLARITIN) 10 MG tablet 1 tablet    . losartan (COZAAR) 50 MG tablet Take 1 tablet (50 mg total) by mouth daily. 30 tablet 0  . Magnesium 250 MG TABS Take by mouth.    . Multiple Vitamins-Minerals (PRESERVISION AREDS 2) CAPS     . Omega 3 1000 MG CAPS 1 capsule    . ONETOUCH VERIO test strip USE TO CHECK BLOOD SUGAR ONCE DAILY.    . pantoprazole (PROTONIX) 40 MG tablet Take 1 tablet (40 mg total) by mouth daily. Take 30-60 min before first meal of the day 30 tablet 5  . potassium chloride (K-DUR) 10 MEQ tablet TK 1 T PO  ONCE A DAY    . SYSTANE COMPLETE 0.6 % SOLN     . UNABLE TO FIND     . UNABLE TO FIND albuterol sulfate HFA 90 mcg/actuation aerosol inhaler  TAKE 2 PUFFS AS NEEDED EVERY 4 HOURS    . vitamin B-12 (CYANOCOBALAMIN) 1000 MCG tablet Take 1,000 mcg by mouth daily.    . vitamin B-12 (CYANOCOBALAMIN) 500 MCG tablet      No current facility-administered medications on file prior to visit.   Allergies  Allergen Reactions  . Amoxicillin-Pot Clavulanate Diarrhea  . Catapres [Clonidine Hcl] Other (See Comments)    Unknown. Unknown.  . Codeine Other (See Comments)    "Makes me out of my head, loopy"  . Covera-Hs [Verapamil Hcl] Other (See Comments)    Unknown.  Marland Kitchen Hydrocodone Other  (See Comments)    "makes me go out of my head, loopy"  . Other Other (See Comments)    Any type of narcotics Feels "loopy" Any type of narcotics Feels "loopy"  . Sulfa Antibiotics Itching  . Sulfamethoxazole Diarrhea  . Verapamil Diarrhea and Other (See Comments)  . Sulfasalazine Itching    No results found for this or any previous visit (from the past 2160 hour(s)).  Objective: General: Patient is awake, alert, and oriented x 3 and in no acute distress.  Integument: Skin is warm, dry and supple bilateral. Nails are short and dystrophic with subungual debris, consistent with onychomycosis, 1-5 bilateral. +ulceration at left 1st toe that measures 0.5x0.4cm with fatty tissue exposed at medial hallux with no swelling, decreased warmth, active bloody drainage,  No odor.   Vasculature:  Dorsalis Pedis pulse 1/4 bilateral. Posterior Tibial pulse  0/4 bilateral. Capillary fill time <3 sec 1-5 bilateral. No hair growth to the level of the digits.Temperature gradient within normal limits. + varicosities present bilateral. Trace edema present bilateral.   Neurology: Protective sensation slightly diminished bilateral.   Musculoskeletal:+ decreased pain to left 1st toe at ulceration.  + Bunion, hammertoes, pes planus asymptomatic pedal deformities noted bilateral. Muscular strength 4/5 in all lower extremity muscular groups bilateral without pain on range of motion.   Assessment and Plan: Problem List Items Addressed This Visit      Endocrine   Diabetic foot ulcer ([place]) - Primary    Other Visit Diagnoses    Diabetic peripheral neuropathy associated with type 2 diabetes mellitus ([place])         -Examined patient. -Excisionally dedbrided ulceration at left 1st toe to healthy bleeding borders removing nonviable tissue using a tissue nipper, Wound measures post debridement as above, Wound was debrided to the level of the dermis with viable wound base exposed to promote healing. Hemostasis was  achieved with manuel pressure. Patient tolerated procedure well without any discomfort or anesthesia necessary for this wound debridement.  -Applied iodosorb and dry sterile dressing and instructed patient to continue with daily dressings at home consisting of the same with help from home aide like before -Continue with diabetic shoes that do not rub  -Advised patient to go to the ER or return to office if the wound worsens or if constitutional symptoms are present. -Return in 2-3 weeks for wound care.  Landis Martins, DPM

## 2020-09-03 DIAGNOSIS — I5032 Chronic diastolic (congestive) heart failure: Secondary | ICD-10-CM | POA: Diagnosis not present

## 2020-09-03 DIAGNOSIS — E11621 Type 2 diabetes mellitus with foot ulcer: Secondary | ICD-10-CM | POA: Diagnosis not present

## 2020-09-03 DIAGNOSIS — E113293 Type 2 diabetes mellitus with mild nonproliferative diabetic retinopathy without macular edema, bilateral: Secondary | ICD-10-CM | POA: Diagnosis not present

## 2020-09-03 DIAGNOSIS — L97529 Non-pressure chronic ulcer of other part of left foot with unspecified severity: Secondary | ICD-10-CM | POA: Diagnosis not present

## 2020-09-03 DIAGNOSIS — E1140 Type 2 diabetes mellitus with diabetic neuropathy, unspecified: Secondary | ICD-10-CM | POA: Diagnosis not present

## 2020-09-03 DIAGNOSIS — I110 Hypertensive heart disease with heart failure: Secondary | ICD-10-CM | POA: Diagnosis not present

## 2020-09-04 ENCOUNTER — Encounter: Payer: Self-pay | Admitting: Podiatry

## 2020-09-06 ENCOUNTER — Other Ambulatory Visit: Payer: Self-pay

## 2020-09-06 ENCOUNTER — Telehealth: Payer: Self-pay | Admitting: Podiatry

## 2020-09-06 ENCOUNTER — Ambulatory Visit (INDEPENDENT_AMBULATORY_CARE_PROVIDER_SITE_OTHER): Payer: Medicare Other | Admitting: Podiatry

## 2020-09-06 DIAGNOSIS — E11621 Type 2 diabetes mellitus with foot ulcer: Secondary | ICD-10-CM

## 2020-09-06 DIAGNOSIS — E1142 Type 2 diabetes mellitus with diabetic polyneuropathy: Secondary | ICD-10-CM

## 2020-09-06 DIAGNOSIS — L97521 Non-pressure chronic ulcer of other part of left foot limited to breakdown of skin: Secondary | ICD-10-CM | POA: Diagnosis not present

## 2020-09-06 DIAGNOSIS — E0843 Diabetes mellitus due to underlying condition with diabetic autonomic (poly)neuropathy: Secondary | ICD-10-CM | POA: Diagnosis not present

## 2020-09-06 NOTE — Telephone Encounter (Signed)
Patients daughter called stating that the caregiver called stating that they have noticed drainage coming from the toe.  The daughter has not seen it herself.  Denies any fevers or chills.  No nausea or vomiting.  They have been applying Iodosorb to the wound per Dr. Leeanne Rio direction.  Patient's daughter states that she is worried about gangrene given the drainage.  I recommend the patient to the urgent care, ER however they did not want to go.  They sent me a picture.  It appears to have pink color to the distal portion of the toe and no obvious signs of gangrene.  Wound is present with mild erythema.  Prescribed doxycycline and recommended switch to mupirocin ointment.  Monitor closely for any signs or symptoms of worsening infection and report to the ER should any occur.  Otherwise we will have her follow-up Monday in the office.

## 2020-09-06 NOTE — Telephone Encounter (Signed)
Thanks

## 2020-09-07 ENCOUNTER — Encounter: Payer: Self-pay | Admitting: Podiatry

## 2020-09-07 DIAGNOSIS — I110 Hypertensive heart disease with heart failure: Secondary | ICD-10-CM | POA: Diagnosis not present

## 2020-09-07 DIAGNOSIS — I5032 Chronic diastolic (congestive) heart failure: Secondary | ICD-10-CM | POA: Diagnosis not present

## 2020-09-07 DIAGNOSIS — E1140 Type 2 diabetes mellitus with diabetic neuropathy, unspecified: Secondary | ICD-10-CM | POA: Diagnosis not present

## 2020-09-07 DIAGNOSIS — E113293 Type 2 diabetes mellitus with mild nonproliferative diabetic retinopathy without macular edema, bilateral: Secondary | ICD-10-CM | POA: Diagnosis not present

## 2020-09-07 DIAGNOSIS — E11621 Type 2 diabetes mellitus with foot ulcer: Secondary | ICD-10-CM | POA: Diagnosis not present

## 2020-09-07 DIAGNOSIS — L97529 Non-pressure chronic ulcer of other part of left foot with unspecified severity: Secondary | ICD-10-CM | POA: Diagnosis not present

## 2020-09-07 NOTE — Progress Notes (Signed)
  Subjective:  Patient ID: Heidi Dominguez, female    DOB: 04-Aug-1921,  MRN: [id number]  Chief Complaint  Patient presents with  . Wound Check    Left hallux wound. PT stated that it has been bleeding the last two days but it has stopped. PT stated that her toe is sore.,    87 y.o. female presents for an urgent visit with the above complaint. History confirmed with patient. Here with a caretaker today.  She just saw Dr. Cannon Kettle last week.  They noticed significant drainage over the weekend and wanted to be evaluated today.  Objective:  Physical Exam: Weakly palpable pedal pulses.  Foot is warm and well-perfused.  Capillary fill time is intact.  Loss of protective sensation bilaterally in the toes.  Left foot with severe hallux valgus and hammertoes ulceration to subcutaneous tissue on the medial hallux with macerated skin and blister formation around this.  Small eschar with fibrous tissue around it.  No purulent drainage.  No malodor.  No cellulitis.  Assessment:   1. Diabetic ulcer of toe of left foot associated with type 2 diabetes mellitus, limited to breakdown of skin ([place])   2. Diabetic peripheral neuropathy associated with type 2 diabetes mellitus ([place])   3. Diabetes mellitus due to underlying condition with diabetic autonomic neuropathy, unspecified whether long term insulin use ([place])      Plan:  Patient was evaluated and treated and all questions answered.  Discussed and evaluated wound with patient and caregiver.  Discussed with him that with an open wound with some drainage to be expected.  Currently has no signs of infection I do not think we need to begin her on any oral antibiotics.  We should continue the current local wound care course.  She will follow up with Dr. Cannon Kettle at her next visit  Lanae Crumbly, [facility] 09/07/2020     No follow-ups on file.

## 2020-09-10 ENCOUNTER — Telehealth: Payer: Self-pay | Admitting: *Deleted

## 2020-09-10 DIAGNOSIS — I5032 Chronic diastolic (congestive) heart failure: Secondary | ICD-10-CM | POA: Diagnosis not present

## 2020-09-10 DIAGNOSIS — E11621 Type 2 diabetes mellitus with foot ulcer: Secondary | ICD-10-CM | POA: Diagnosis not present

## 2020-09-10 DIAGNOSIS — I110 Hypertensive heart disease with heart failure: Secondary | ICD-10-CM | POA: Diagnosis not present

## 2020-09-10 DIAGNOSIS — L97529 Non-pressure chronic ulcer of other part of left foot with unspecified severity: Secondary | ICD-10-CM | POA: Diagnosis not present

## 2020-09-10 DIAGNOSIS — E113293 Type 2 diabetes mellitus with mild nonproliferative diabetic retinopathy without macular edema, bilateral: Secondary | ICD-10-CM | POA: Diagnosis not present

## 2020-09-10 DIAGNOSIS — E1140 Type 2 diabetes mellitus with diabetic neuropathy, unspecified: Secondary | ICD-10-CM | POA: Diagnosis not present

## 2020-09-10 NOTE — Telephone Encounter (Signed)
Erica w/ [facility] is wanting to clarify the order: which  type of ointment to use on patient for wound care.   Explained to her that according Dr Leigh Aurora note he is recommending to switch to mupirocin ointment, monitor closely for signs or symptoms of worsening infection and go to ER if this should occur.verbalized understanding.

## 2020-09-14 DIAGNOSIS — E113593 Type 2 diabetes mellitus with proliferative diabetic retinopathy without macular edema, bilateral: Secondary | ICD-10-CM | POA: Diagnosis not present

## 2020-09-14 DIAGNOSIS — H353211 Exudative age-related macular degeneration, right eye, with active choroidal neovascularization: Secondary | ICD-10-CM | POA: Diagnosis not present

## 2020-09-15 DIAGNOSIS — E11621 Type 2 diabetes mellitus with foot ulcer: Secondary | ICD-10-CM | POA: Diagnosis not present

## 2020-09-15 DIAGNOSIS — I110 Hypertensive heart disease with heart failure: Secondary | ICD-10-CM | POA: Diagnosis not present

## 2020-09-15 DIAGNOSIS — E113293 Type 2 diabetes mellitus with mild nonproliferative diabetic retinopathy without macular edema, bilateral: Secondary | ICD-10-CM | POA: Diagnosis not present

## 2020-09-15 DIAGNOSIS — L97529 Non-pressure chronic ulcer of other part of left foot with unspecified severity: Secondary | ICD-10-CM | POA: Diagnosis not present

## 2020-09-15 DIAGNOSIS — I5032 Chronic diastolic (congestive) heart failure: Secondary | ICD-10-CM | POA: Diagnosis not present

## 2020-09-15 DIAGNOSIS — E1140 Type 2 diabetes mellitus with diabetic neuropathy, unspecified: Secondary | ICD-10-CM | POA: Diagnosis not present

## 2020-09-19 ENCOUNTER — Other Ambulatory Visit: Payer: Self-pay | Admitting: Internal Medicine

## 2020-09-20 DIAGNOSIS — L97529 Non-pressure chronic ulcer of other part of left foot with unspecified severity: Secondary | ICD-10-CM | POA: Diagnosis not present

## 2020-09-20 DIAGNOSIS — I110 Hypertensive heart disease with heart failure: Secondary | ICD-10-CM | POA: Diagnosis not present

## 2020-09-20 DIAGNOSIS — E11621 Type 2 diabetes mellitus with foot ulcer: Secondary | ICD-10-CM | POA: Diagnosis not present

## 2020-09-20 DIAGNOSIS — E113293 Type 2 diabetes mellitus with mild nonproliferative diabetic retinopathy without macular edema, bilateral: Secondary | ICD-10-CM | POA: Diagnosis not present

## 2020-09-20 DIAGNOSIS — I5032 Chronic diastolic (congestive) heart failure: Secondary | ICD-10-CM | POA: Diagnosis not present

## 2020-09-20 DIAGNOSIS — E1140 Type 2 diabetes mellitus with diabetic neuropathy, unspecified: Secondary | ICD-10-CM | POA: Diagnosis not present

## 2020-09-22 DIAGNOSIS — L97529 Non-pressure chronic ulcer of other part of left foot with unspecified severity: Secondary | ICD-10-CM | POA: Diagnosis not present

## 2020-09-22 DIAGNOSIS — I110 Hypertensive heart disease with heart failure: Secondary | ICD-10-CM | POA: Diagnosis not present

## 2020-09-22 DIAGNOSIS — I5032 Chronic diastolic (congestive) heart failure: Secondary | ICD-10-CM | POA: Diagnosis not present

## 2020-09-22 DIAGNOSIS — E113293 Type 2 diabetes mellitus with mild nonproliferative diabetic retinopathy without macular edema, bilateral: Secondary | ICD-10-CM | POA: Diagnosis not present

## 2020-09-22 DIAGNOSIS — E11621 Type 2 diabetes mellitus with foot ulcer: Secondary | ICD-10-CM | POA: Diagnosis not present

## 2020-09-22 DIAGNOSIS — E1140 Type 2 diabetes mellitus with diabetic neuropathy, unspecified: Secondary | ICD-10-CM | POA: Diagnosis not present

## 2020-09-23 DIAGNOSIS — Z79899 Other long term (current) drug therapy: Secondary | ICD-10-CM | POA: Diagnosis not present

## 2020-09-23 DIAGNOSIS — I340 Nonrheumatic mitral (valve) insufficiency: Secondary | ICD-10-CM | POA: Diagnosis not present

## 2020-09-23 DIAGNOSIS — Z9181 History of falling: Secondary | ICD-10-CM | POA: Diagnosis not present

## 2020-09-23 DIAGNOSIS — R269 Unspecified abnormalities of gait and mobility: Secondary | ICD-10-CM | POA: Diagnosis not present

## 2020-09-23 DIAGNOSIS — E113293 Type 2 diabetes mellitus with mild nonproliferative diabetic retinopathy without macular edema, bilateral: Secondary | ICD-10-CM | POA: Diagnosis not present

## 2020-09-23 DIAGNOSIS — E11621 Type 2 diabetes mellitus with foot ulcer: Secondary | ICD-10-CM | POA: Diagnosis not present

## 2020-09-23 DIAGNOSIS — Z741 Need for assistance with personal care: Secondary | ICD-10-CM | POA: Diagnosis not present

## 2020-09-23 DIAGNOSIS — D696 Thrombocytopenia, unspecified: Secondary | ICD-10-CM | POA: Diagnosis not present

## 2020-09-23 DIAGNOSIS — I700 Atherosclerosis of aorta: Secondary | ICD-10-CM | POA: Diagnosis not present

## 2020-09-23 DIAGNOSIS — L97529 Non-pressure chronic ulcer of other part of left foot with unspecified severity: Secondary | ICD-10-CM | POA: Diagnosis not present

## 2020-09-23 DIAGNOSIS — K219 Gastro-esophageal reflux disease without esophagitis: Secondary | ICD-10-CM | POA: Diagnosis not present

## 2020-09-23 DIAGNOSIS — I5032 Chronic diastolic (congestive) heart failure: Secondary | ICD-10-CM | POA: Diagnosis not present

## 2020-09-23 DIAGNOSIS — Z7982 Long term (current) use of aspirin: Secondary | ICD-10-CM | POA: Diagnosis not present

## 2020-09-23 DIAGNOSIS — I110 Hypertensive heart disease with heart failure: Secondary | ICD-10-CM | POA: Diagnosis not present

## 2020-09-23 DIAGNOSIS — E1140 Type 2 diabetes mellitus with diabetic neuropathy, unspecified: Secondary | ICD-10-CM | POA: Diagnosis not present

## 2020-09-27 DIAGNOSIS — I5032 Chronic diastolic (congestive) heart failure: Secondary | ICD-10-CM | POA: Diagnosis not present

## 2020-09-27 DIAGNOSIS — E1140 Type 2 diabetes mellitus with diabetic neuropathy, unspecified: Secondary | ICD-10-CM | POA: Diagnosis not present

## 2020-09-27 DIAGNOSIS — I110 Hypertensive heart disease with heart failure: Secondary | ICD-10-CM | POA: Diagnosis not present

## 2020-09-27 DIAGNOSIS — L97529 Non-pressure chronic ulcer of other part of left foot with unspecified severity: Secondary | ICD-10-CM | POA: Diagnosis not present

## 2020-09-27 DIAGNOSIS — E11621 Type 2 diabetes mellitus with foot ulcer: Secondary | ICD-10-CM | POA: Diagnosis not present

## 2020-09-27 DIAGNOSIS — E113293 Type 2 diabetes mellitus with mild nonproliferative diabetic retinopathy without macular edema, bilateral: Secondary | ICD-10-CM | POA: Diagnosis not present

## 2020-09-29 DIAGNOSIS — L97529 Non-pressure chronic ulcer of other part of left foot with unspecified severity: Secondary | ICD-10-CM | POA: Diagnosis not present

## 2020-09-29 DIAGNOSIS — E1140 Type 2 diabetes mellitus with diabetic neuropathy, unspecified: Secondary | ICD-10-CM | POA: Diagnosis not present

## 2020-09-29 DIAGNOSIS — I5032 Chronic diastolic (congestive) heart failure: Secondary | ICD-10-CM | POA: Diagnosis not present

## 2020-09-29 DIAGNOSIS — E11621 Type 2 diabetes mellitus with foot ulcer: Secondary | ICD-10-CM | POA: Diagnosis not present

## 2020-09-29 DIAGNOSIS — E113293 Type 2 diabetes mellitus with mild nonproliferative diabetic retinopathy without macular edema, bilateral: Secondary | ICD-10-CM | POA: Diagnosis not present

## 2020-09-29 DIAGNOSIS — I110 Hypertensive heart disease with heart failure: Secondary | ICD-10-CM | POA: Diagnosis not present

## 2020-09-30 ENCOUNTER — Encounter: Payer: Self-pay | Admitting: Sports Medicine

## 2020-09-30 ENCOUNTER — Other Ambulatory Visit: Payer: Self-pay

## 2020-09-30 ENCOUNTER — Ambulatory Visit (INDEPENDENT_AMBULATORY_CARE_PROVIDER_SITE_OTHER): Payer: Medicare Other | Admitting: Sports Medicine

## 2020-09-30 DIAGNOSIS — L97521 Non-pressure chronic ulcer of other part of left foot limited to breakdown of skin: Secondary | ICD-10-CM | POA: Diagnosis not present

## 2020-09-30 DIAGNOSIS — E1142 Type 2 diabetes mellitus with diabetic polyneuropathy: Secondary | ICD-10-CM

## 2020-09-30 DIAGNOSIS — E11621 Type 2 diabetes mellitus with foot ulcer: Secondary | ICD-10-CM | POA: Diagnosis not present

## 2020-09-30 DIAGNOSIS — M21612 Bunion of left foot: Secondary | ICD-10-CM

## 2020-09-30 DIAGNOSIS — M2012 Hallux valgus (acquired), left foot: Secondary | ICD-10-CM

## 2020-09-30 NOTE — Progress Notes (Signed)
Subjective: Heidi Dominguez is a 87 y.o. female patient with history of diabetes who returns to office today for wound care. Patient is assisted by aide and reports that she is doing ok with some bleeding at 1st toe and states that they did not pick up the other ointment yet. Denies nausea, vomiting, fever, chills, or any other symptoms at this time.   Patient Active Problem List   Diagnosis Date Noted  . Diabetic foot ulcer ([place]) 09/02/2020  . Diabetic neuropathy ([place]) 09/02/2020  . Gait abnormality 09/02/2020  . Diabetic retinopathy associated with type 2 diabetes mellitus ([place]) 09/02/2020  . Hardening of the aorta (main artery of the heart) ([place]) 09/02/2020  . Pure hypercholesterolemia 09/02/2020  . Thrombocytopenia ([place]) 09/02/2020  . Gastroesophageal reflux disease 10/22/2019  . Unspecified chronic bronchitis ([place]) 09/02/2019  . Infection or inflammatory reaction due to internal joint prosthesis ([place]) 02/20/2019  . Rhinitis, chronic 01/07/2019  . Infection of shoulder ([place]) 11/08/2018  . History of hemiarthroplasty of right shoulder 09/25/2018  . UTI (urinary tract infection) 09/12/2018  . Acute pancreatitis 08/28/2018  . Acute recurrent pancreatitis   . Abdominal pain 08/23/2018  . Elevated LFTs 07/04/2018  . CKD (chronic kidney disease) stage 3, GFR 30-59 ml/min (HCC) 07/04/2018  . Upper airway cough syndrome 03/13/2018  . Sensorineural hearing loss (SNHL), bilateral 12/04/2016  . Post-nasal drainage 03/14/2016  . Presbycusis of both ears 03/14/2016  . Bilateral hearing loss 10/06/2015  . Bilateral impacted cerumen 10/06/2015  . 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 Subjective tinnitus of both ears 10/06/2015  . Choledocholithiasis with chronic cholecystitis, nonobstructing 04/11/2015  . Pre-operative cardiovascular examination, high risk surgery   . Epigastric abdominal pain 04/08/2015  . Acute gallstone pancreatitis s/p lap chole w [facility] 04/11/2015  04/08/2015  . Diabetes mellitus type 2, controlled ([place]) 04/08/2015  . Gallstone pancreatitis 04/08/2015  . Mitral regurgitation 04/08/2015  . Chronic diastolic CHF (congestive heart failure) ([place]) 04/08/2015  . DOE (dyspnea on exertion) 03/31/2015  . Ischemic colitis ([place]) 08/09/2012  . Colitis 08/07/2012  . Rectal bleeding 08/07/2012  . Weakness 09/24/2011  . Bradycardia 09/24/2011  . Acute lower UTI 09/24/2011  . Hematochezia 09/24/2011  . Essential hypertension 09/24/2011  . Dyslipidemia 09/24/2011   Current Outpatient Medications on File Prior to Visit  Medication Sig Dispense Refill  . albuterol (PROVENTIL HFA;VENTOLIN HFA) 108 (90 Base) MCG/ACT inhaler Inhale into the lungs every 6 (six) hours as needed for wheezing or shortness of breath.    Marland Kitchen amLODipine (NORVASC) 10 MG tablet TK 1 T PO QD    . amoxicillin-clavulanate (AUGMENTIN) 500-125 MG tablet Take 1 tablet (500 mg total) by mouth 3 (three) times daily. 28 tablet 0  . aspirin EC 81 MG tablet Take 81 mg by mouth daily.    . budesonide-formoterol (SYMBICORT) 160-4.5 MCG/ACT inhaler 2 puffs    . Carboxymethylcellulose Sod PF (REFRESH PLUS) 0.5 % SOLN See admin instructions.    . Cholecalciferol (VITAMIN D-3) 125 MCG (5000 UT) TABS Take 5,000 Units by mouth daily.    . ciprofloxacin (CIPRO) 500 MG tablet SMARTSIG:1 Tablet(s) By Mouth Every 12 Hours    . colchicine 0.6 MG tablet Take 1 tablet (0.6 mg total) by mouth daily. 14 tablet 0  . doxycycline (VIBRA-TABS) 100 MG tablet Take 1 tablet (100 mg total) by mouth 2 (two) times daily. 20 tablet 0  . erythromycin ophthalmic ointment erythromycin 5 mg/gram (0.5 %) eye ointment  APPLY A SMALL AMOUNT INTO THE  RIGHT EYE FOUR TIMES DAILY AS NEEDED FOR PAIN    . famotidine (PEPCID) 20 MG tablet Take one after  supper 30 tablet 2  . fentaNYL (DURAGESIC) 25 MCG/HR APP 1 PA EXT TO THE SKIN Q 72 H    . Ferrous Gluconate (IRON) 240 (27 FE) MG TABS Take 240 mg by mouth daily.     .  fluconazole (DIFLUCAN) 100 MG tablet fluconazole 100 mg tablet  TAKE 1 TABLET BY MOUTH EVERY DAY FOR 1 DAY    . furosemide (LASIX) 20 MG tablet furosemide 20 mg tablet    . hydrocortisone (ANUSOL-HC) 2.5 % rectal cream Apply topically 2 (two) times daily.    . hydrocortisone 2.5 % cream hydrocortisone 2.5 % topical cream with perineal applicator    . ibuprofen (ADVIL) 200 MG tablet 1 tablet with food or milk as needed    . JANUVIA 50 MG tablet Take 50 mg by mouth daily.   5  . levalbuterol (XOPENEX) 1.25 MG/3ML nebulizer solution 1 vial in neb every 4 hours as needed    . lidocaine (XYLOCAINE) 5 % ointment APPLY TOPICALLY AS NEEDED. TOES FOR PAIN 35.44 g 0  . lisinopril (ZESTRIL) 5 MG tablet lisinopril 5 mg tablet  TK 1 T PO QD    . loratadine (CLARITIN) 10 MG tablet 1 tablet    . losartan (COZAAR) 50 MG tablet TAKE 1 TABLET(50 MG) BY MOUTH DAILY 30 tablet 0  . Magnesium 250 MG TABS Take by mouth.    . Multiple Vitamins-Minerals (PRESERVISION AREDS 2) CAPS     . mupirocin ointment (BACTROBAN) 2 % Apply topically daily.    . Omega 3 1000 MG CAPS 1 capsule    . ONETOUCH VERIO test strip USE TO CHECK BLOOD SUGAR ONCE DAILY.    . pantoprazole (PROTONIX) 40 MG tablet Take 1 tablet (40 mg total) by mouth daily. Take 30-60 min before first meal of the day 30 tablet 5  . potassium chloride (K-DUR) 10 MEQ tablet TK 1 T PO  ONCE A DAY    . SYSTANE COMPLETE 0.6 % SOLN     . UNABLE TO FIND     . UNABLE TO FIND albuterol sulfate HFA 90 mcg/actuation aerosol inhaler  TAKE 2 PUFFS AS NEEDED EVERY 4 HOURS    . vitamin B-12 (CYANOCOBALAMIN) 1000 MCG tablet Take 1,000 mcg by mouth daily.    . vitamin B-12 (CYANOCOBALAMIN) 500 MCG tablet      No current facility-administered medications on file prior to visit.   Allergies  Allergen Reactions  . Amoxicillin-Pot Clavulanate Diarrhea  . Catapres [Clonidine Hcl] Other (See Comments)    Unknown. Unknown.  . Codeine Other (See Comments)    "Makes me out  of my head, loopy"  . Covera-Hs [Verapamil Hcl] Other (See Comments)    Unknown.  Marland Kitchen Hydrocodone Other (See Comments)    "makes me go out of my head, loopy"  . Other Other (See Comments)    Any type of narcotics Feels "loopy" Any type of narcotics Feels "loopy"  . Sulfa Antibiotics Itching  . Sulfamethoxazole Diarrhea  . Verapamil Diarrhea and Other (See Comments)  . Sulfasalazine Itching    No results found for this or any previous visit (from the past 2160 hour(s)).  Objective: General: Patient is awake, alert, and oriented x 3 and in no acute distress.  Integument: Skin is warm, dry and supple bilateral. Nails are short and dystrophic with subungual debris, consistent with onychomycosis, 1-5 bilateral. +  ulceration at left 1st toe that measures 0.6x0.4cm with fatty tissue exposed at medial hallux post debridement with no swelling, decreased warmth, minimal active bloody drainage, No odor.   Vasculature:  Dorsalis Pedis pulse 1/4 bilateral. Posterior Tibial pulse  0/4 bilateral. Capillary fill time <3 sec 1-5 bilateral. No hair growth to the level of the digits.Temperature gradient within normal limits. + varicosities present bilateral. Trace edema present bilateral.   Neurology: Protective sensation slightly diminished bilateral.   Musculoskeletal:+ decreased pain to left 1st toe at ulceration.  + Bunion, hammertoes, pes planus asymptomatic pedal deformities noted bilateral. Muscular strength 4/5 in all lower extremity muscular groups bilateral without pain on range of motion.   Assessment and Plan: Problem List Items Addressed This Visit      Endocrine   Diabetic foot ulcer ([place]) - Primary    Other Visit Diagnoses    Diabetic peripheral neuropathy associated with type 2 diabetes mellitus ([place])       Hallux valgus with bunions, left         -Examined patient. -Excisionally dedbrided ulceration at left 1st toe to healthy bleeding borders removing nonviable tissue using a  tissue nipper, Wound measures post debridement as above, Wound was debrided to the level of the dermis with viable wound base exposed to promote healing. Hemostasis was achieved with manuel pressure. Patient tolerated procedure well without any discomfort or anesthesia necessary for this wound debridement.  -Applied iodosorb and dry sterile dressing and instructed patient to continue with daily dressings at home consisting of the same with help from home aide like before and to refrain from putting dressing too tight -Continue with diabetic shoes that do not rub like before -Advised patient to go to the ER or return to office if the wound worsens or if constitutional symptoms are present. -Return in 2-3 weeks for wound care.  Landis Martins, DPM

## 2020-10-04 DIAGNOSIS — E1140 Type 2 diabetes mellitus with diabetic neuropathy, unspecified: Secondary | ICD-10-CM | POA: Diagnosis not present

## 2020-10-04 DIAGNOSIS — E11621 Type 2 diabetes mellitus with foot ulcer: Secondary | ICD-10-CM | POA: Diagnosis not present

## 2020-10-04 DIAGNOSIS — I110 Hypertensive heart disease with heart failure: Secondary | ICD-10-CM | POA: Diagnosis not present

## 2020-10-04 DIAGNOSIS — E113293 Type 2 diabetes mellitus with mild nonproliferative diabetic retinopathy without macular edema, bilateral: Secondary | ICD-10-CM | POA: Diagnosis not present

## 2020-10-04 DIAGNOSIS — L97529 Non-pressure chronic ulcer of other part of left foot with unspecified severity: Secondary | ICD-10-CM | POA: Diagnosis not present

## 2020-10-04 DIAGNOSIS — I5032 Chronic diastolic (congestive) heart failure: Secondary | ICD-10-CM | POA: Diagnosis not present

## 2020-10-06 DIAGNOSIS — E1140 Type 2 diabetes mellitus with diabetic neuropathy, unspecified: Secondary | ICD-10-CM | POA: Diagnosis not present

## 2020-10-06 DIAGNOSIS — E113293 Type 2 diabetes mellitus with mild nonproliferative diabetic retinopathy without macular edema, bilateral: Secondary | ICD-10-CM | POA: Diagnosis not present

## 2020-10-06 DIAGNOSIS — I5032 Chronic diastolic (congestive) heart failure: Secondary | ICD-10-CM | POA: Diagnosis not present

## 2020-10-06 DIAGNOSIS — E11621 Type 2 diabetes mellitus with foot ulcer: Secondary | ICD-10-CM | POA: Diagnosis not present

## 2020-10-06 DIAGNOSIS — I110 Hypertensive heart disease with heart failure: Secondary | ICD-10-CM | POA: Diagnosis not present

## 2020-10-06 DIAGNOSIS — L97529 Non-pressure chronic ulcer of other part of left foot with unspecified severity: Secondary | ICD-10-CM | POA: Diagnosis not present

## 2020-10-08 DIAGNOSIS — I110 Hypertensive heart disease with heart failure: Secondary | ICD-10-CM | POA: Diagnosis not present

## 2020-10-08 DIAGNOSIS — E1140 Type 2 diabetes mellitus with diabetic neuropathy, unspecified: Secondary | ICD-10-CM | POA: Diagnosis not present

## 2020-10-08 DIAGNOSIS — L97529 Non-pressure chronic ulcer of other part of left foot with unspecified severity: Secondary | ICD-10-CM | POA: Diagnosis not present

## 2020-10-08 DIAGNOSIS — I5032 Chronic diastolic (congestive) heart failure: Secondary | ICD-10-CM | POA: Diagnosis not present

## 2020-10-08 DIAGNOSIS — E113293 Type 2 diabetes mellitus with mild nonproliferative diabetic retinopathy without macular edema, bilateral: Secondary | ICD-10-CM | POA: Diagnosis not present

## 2020-10-08 DIAGNOSIS — E11621 Type 2 diabetes mellitus with foot ulcer: Secondary | ICD-10-CM | POA: Diagnosis not present

## 2020-10-12 DIAGNOSIS — E113293 Type 2 diabetes mellitus with mild nonproliferative diabetic retinopathy without macular edema, bilateral: Secondary | ICD-10-CM | POA: Diagnosis not present

## 2020-10-12 DIAGNOSIS — I110 Hypertensive heart disease with heart failure: Secondary | ICD-10-CM | POA: Diagnosis not present

## 2020-10-12 DIAGNOSIS — I5032 Chronic diastolic (congestive) heart failure: Secondary | ICD-10-CM | POA: Diagnosis not present

## 2020-10-12 DIAGNOSIS — L97529 Non-pressure chronic ulcer of other part of left foot with unspecified severity: Secondary | ICD-10-CM | POA: Diagnosis not present

## 2020-10-12 DIAGNOSIS — E1140 Type 2 diabetes mellitus with diabetic neuropathy, unspecified: Secondary | ICD-10-CM | POA: Diagnosis not present

## 2020-10-12 DIAGNOSIS — E11621 Type 2 diabetes mellitus with foot ulcer: Secondary | ICD-10-CM | POA: Diagnosis not present

## 2020-10-13 DIAGNOSIS — E113293 Type 2 diabetes mellitus with mild nonproliferative diabetic retinopathy without macular edema, bilateral: Secondary | ICD-10-CM | POA: Diagnosis not present

## 2020-10-13 DIAGNOSIS — I5032 Chronic diastolic (congestive) heart failure: Secondary | ICD-10-CM | POA: Diagnosis not present

## 2020-10-13 DIAGNOSIS — L97529 Non-pressure chronic ulcer of other part of left foot with unspecified severity: Secondary | ICD-10-CM | POA: Diagnosis not present

## 2020-10-13 DIAGNOSIS — I110 Hypertensive heart disease with heart failure: Secondary | ICD-10-CM | POA: Diagnosis not present

## 2020-10-13 DIAGNOSIS — E11621 Type 2 diabetes mellitus with foot ulcer: Secondary | ICD-10-CM | POA: Diagnosis not present

## 2020-10-13 DIAGNOSIS — E1140 Type 2 diabetes mellitus with diabetic neuropathy, unspecified: Secondary | ICD-10-CM | POA: Diagnosis not present

## 2020-10-18 DIAGNOSIS — E11621 Type 2 diabetes mellitus with foot ulcer: Secondary | ICD-10-CM | POA: Diagnosis not present

## 2020-10-18 DIAGNOSIS — E1140 Type 2 diabetes mellitus with diabetic neuropathy, unspecified: Secondary | ICD-10-CM | POA: Diagnosis not present

## 2020-10-18 DIAGNOSIS — E113293 Type 2 diabetes mellitus with mild nonproliferative diabetic retinopathy without macular edema, bilateral: Secondary | ICD-10-CM | POA: Diagnosis not present

## 2020-10-18 DIAGNOSIS — I5032 Chronic diastolic (congestive) heart failure: Secondary | ICD-10-CM | POA: Diagnosis not present

## 2020-10-18 DIAGNOSIS — I110 Hypertensive heart disease with heart failure: Secondary | ICD-10-CM | POA: Diagnosis not present

## 2020-10-18 DIAGNOSIS — L97529 Non-pressure chronic ulcer of other part of left foot with unspecified severity: Secondary | ICD-10-CM | POA: Diagnosis not present

## 2020-10-21 ENCOUNTER — Other Ambulatory Visit: Payer: Self-pay

## 2020-10-21 ENCOUNTER — Ambulatory Visit (INDEPENDENT_AMBULATORY_CARE_PROVIDER_SITE_OTHER): Payer: Medicare Other | Admitting: Sports Medicine

## 2020-10-21 ENCOUNTER — Encounter: Payer: Self-pay | Admitting: Sports Medicine

## 2020-10-21 DIAGNOSIS — L97521 Non-pressure chronic ulcer of other part of left foot limited to breakdown of skin: Secondary | ICD-10-CM | POA: Diagnosis not present

## 2020-10-21 DIAGNOSIS — M2012 Hallux valgus (acquired), left foot: Secondary | ICD-10-CM

## 2020-10-21 DIAGNOSIS — M21612 Bunion of left foot: Secondary | ICD-10-CM

## 2020-10-21 DIAGNOSIS — E1142 Type 2 diabetes mellitus with diabetic polyneuropathy: Secondary | ICD-10-CM

## 2020-10-21 DIAGNOSIS — E11621 Type 2 diabetes mellitus with foot ulcer: Secondary | ICD-10-CM | POA: Diagnosis not present

## 2020-10-21 NOTE — Progress Notes (Signed)
Subjective: Heidi Dominguez is a 87 y.o. female patient with history of diabetes who returns to office today for wound care. Patient is assisted by aide and reports that she is not sure how the wound is doing has been applying the Iodosorb and not sure if it is getting better may be getting worse patient also reports to a little increase in pain, denies nausea, vomiting, fever, chills, or any other symptoms at this time.   Patient Active Problem List   Diagnosis Date Noted  . Diabetic foot ulcer ([place]) 09/02/2020  . Diabetic neuropathy ([place]) 09/02/2020  . Gait abnormality 09/02/2020  . Diabetic retinopathy associated with type 2 diabetes mellitus ([place]) 09/02/2020  . Hardening of the aorta (main artery of the heart) ([place]) 09/02/2020  . Pure hypercholesterolemia 09/02/2020  . Thrombocytopenia ([place]) 09/02/2020  . Gastroesophageal reflux disease 10/22/2019  . Unspecified chronic bronchitis ([place]) 09/02/2019  . Infection or inflammatory reaction due to internal joint prosthesis ([place]) 02/20/2019  . Rhinitis, chronic 01/07/2019  . Infection of shoulder ([place]) 11/08/2018  . History of hemiarthroplasty of right shoulder 09/25/2018  . UTI (urinary tract infection) 09/12/2018  . Acute pancreatitis 08/28/2018  . Acute recurrent pancreatitis   . Abdominal pain 08/23/2018  . Elevated LFTs 07/04/2018  . CKD (chronic kidney disease) stage 3, GFR 30-59 ml/min (HCC) 07/04/2018  . Upper airway cough syndrome 03/13/2018  . Sensorineural hearing loss (SNHL), bilateral 12/04/2016  . Post-nasal drainage 03/14/2016  . Presbycusis of both ears 03/14/2016  . Bilateral hearing loss 10/06/2015  . Bilateral impacted cerumen 10/06/2015  . 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 Subjective tinnitus of both ears 10/06/2015  . Choledocholithiasis with chronic cholecystitis, nonobstructing 04/11/2015  . Pre-operative cardiovascular examination, high risk surgery   . Epigastric abdominal pain 04/08/2015   . Acute gallstone pancreatitis s/p lap chole w [facility] 04/11/2015 04/08/2015  . Diabetes mellitus type 2, controlled ([place]) 04/08/2015  . Gallstone pancreatitis 04/08/2015  . Mitral regurgitation 04/08/2015  . Chronic diastolic CHF (congestive heart failure) ([place]) 04/08/2015  . DOE (dyspnea on exertion) 03/31/2015  . Ischemic colitis ([place]) 08/09/2012  . Colitis 08/07/2012  . Rectal bleeding 08/07/2012  . Weakness 09/24/2011  . Bradycardia 09/24/2011  . Acute lower UTI 09/24/2011  . Hematochezia 09/24/2011  . Essential hypertension 09/24/2011  . Dyslipidemia 09/24/2011   Current Outpatient Medications on File Prior to Visit  Medication Sig Dispense Refill  . albuterol (PROVENTIL HFA;VENTOLIN HFA) 108 (90 Base) MCG/ACT inhaler Inhale into the lungs every 6 (six) hours as needed for wheezing or shortness of breath.    Marland Kitchen amLODipine (NORVASC) 10 MG tablet TK 1 T PO QD    . amoxicillin-clavulanate (AUGMENTIN) 500-125 MG tablet Take 1 tablet (500 mg total) by mouth 3 (three) times daily. 28 tablet 0  . aspirin EC 81 MG tablet Take 81 mg by mouth daily.    . budesonide-formoterol (SYMBICORT) 160-4.5 MCG/ACT inhaler 2 puffs    . Carboxymethylcellulose Sod PF (REFRESH PLUS) 0.5 % SOLN See admin instructions.    . Cholecalciferol (VITAMIN D-3) 125 MCG (5000 UT) TABS Take 5,000 Units by mouth daily.    . ciprofloxacin (CIPRO) 500 MG tablet SMARTSIG:1 Tablet(s) By Mouth Every 12 Hours    . colchicine 0.6 MG tablet Take 1 tablet (0.6 mg total) by mouth daily. 14 tablet 0  . doxycycline (VIBRA-TABS) 100 MG tablet Take 1 tablet (100 mg total) by mouth 2 (two) times daily. 20 tablet 0  . erythromycin ophthalmic ointment erythromycin  5 mg/gram (0.5 %) eye ointment  APPLY A SMALL AMOUNT INTO THE RIGHT EYE FOUR TIMES DAILY AS NEEDED FOR PAIN    . famotidine (PEPCID) 20 MG tablet Take one after  supper 30 tablet 2  . fentaNYL (DURAGESIC) 25 MCG/HR APP 1 PA EXT TO THE SKIN Q 72 H    . Ferrous Gluconate (IRON)  240 (27 FE) MG TABS Take 240 mg by mouth daily.     . fluconazole (DIFLUCAN) 100 MG tablet fluconazole 100 mg tablet  TAKE 1 TABLET BY MOUTH EVERY DAY FOR 1 DAY    . furosemide (LASIX) 20 MG tablet furosemide 20 mg tablet    . hydrocortisone (ANUSOL-HC) 2.5 % rectal cream Apply topically 2 (two) times daily.    . hydrocortisone 2.5 % cream hydrocortisone 2.5 % topical cream with perineal applicator    . ibuprofen (ADVIL) 200 MG tablet 1 tablet with food or milk as needed    . JANUVIA 50 MG tablet Take 50 mg by mouth daily.   5  . levalbuterol (XOPENEX) 1.25 MG/3ML nebulizer solution 1 vial in neb every 4 hours as needed    . lidocaine (XYLOCAINE) 5 % ointment APPLY TOPICALLY AS NEEDED. TOES FOR PAIN 35.44 g 0  . lisinopril (ZESTRIL) 5 MG tablet lisinopril 5 mg tablet  TK 1 T PO QD    . loratadine (CLARITIN) 10 MG tablet 1 tablet    . losartan (COZAAR) 50 MG tablet TAKE 1 TABLET(50 MG) BY MOUTH DAILY 30 tablet 0  . Magnesium 250 MG TABS Take by mouth.    . Multiple Vitamins-Minerals (PRESERVISION AREDS 2) CAPS     . mupirocin ointment (BACTROBAN) 2 % Apply topically daily.    . Omega 3 1000 MG CAPS 1 capsule    . ONETOUCH VERIO test strip USE TO CHECK BLOOD SUGAR ONCE DAILY.    . pantoprazole (PROTONIX) 40 MG tablet Take 1 tablet (40 mg total) by mouth daily. Take 30-60 min before first meal of the day 30 tablet 5  . potassium chloride (K-DUR) 10 MEQ tablet TK 1 T PO  ONCE A DAY    . SYSTANE COMPLETE 0.6 % SOLN     . UNABLE TO FIND     . UNABLE TO FIND albuterol sulfate HFA 90 mcg/actuation aerosol inhaler  TAKE 2 PUFFS AS NEEDED EVERY 4 HOURS    . vitamin B-12 (CYANOCOBALAMIN) 1000 MCG tablet Take 1,000 mcg by mouth daily.    . vitamin B-12 (CYANOCOBALAMIN) 500 MCG tablet      No current facility-administered medications on file prior to visit.   Allergies  Allergen Reactions  . Amoxicillin-Pot Clavulanate Diarrhea  . Catapres [Clonidine Hcl] Other (See Comments)     Unknown. Unknown.  . Codeine Other (See Comments)    "Makes me out of my head, loopy"  . Covera-Hs [Verapamil Hcl] Other (See Comments)    Unknown.  Marland Kitchen Hydrocodone Other (See Comments)    "makes me go out of my head, loopy"  . Other Other (See Comments)    Any type of narcotics Feels "loopy" Any type of narcotics Feels "loopy"  . Sulfa Antibiotics Itching  . Sulfamethoxazole Diarrhea  . Verapamil Diarrhea and Other (See Comments)  . Sulfasalazine Itching    No results found for this or any previous visit (from the past 2160 hour(s)).  Objective: General: Patient is awake, alert, and oriented x 3 and in no acute distress.  Integument: Skin is warm, dry and supple bilateral.  Nails are short and dystrophic with subungual debris, consistent with onychomycosis, 1-5 bilateral. +ulceration at left 1st toe that measures 1 x 1.5 cm with fatty tissue exposed at medial hallux post debridement with no swelling, decreased warmth, minimal active bloody drainage, No odor.  No other acute signs of infection.  There is a small dry blood blister noted at the distal tuft of the right hallux with no accumulation and no surrounding signs of infection.  Vasculature:  Dorsalis Pedis pulse 1/4 bilateral. Posterior Tibial pulse  0/4 bilateral. Capillary fill time <3 sec 1-5 bilateral. No hair growth to the level of the digits.Temperature gradient within normal limits. + varicosities present bilateral. Trace edema present bilateral.   Neurology: Protective sensation slightly diminished bilateral.   Musculoskeletal:+ decreased pain to left 1st toe at ulceration.  + Bunion, hammertoes, pes planus asymptomatic pedal deformities noted bilateral. Muscular strength 4/5 in all lower extremity muscular groups bilateral without pain on range of motion.   Assessment and Plan: Problem List Items Addressed This Visit      Endocrine   Diabetic foot ulcer ([place]) - Primary    Other Visit Diagnoses    Diabetic peripheral  neuropathy associated with type 2 diabetes mellitus ([place])       Hallux valgus with bunions, left         -Examined patient. -Applied Betadine to the right great toe -Excisionally dedbrided ulceration at left 1st toe to healthy bleeding borders removing nonviable tissue using a tissue nipper, Wound measures post debridement as above, Wound was debrided to the level of the dermis with viable wound base exposed to promote healing. Hemostasis was achieved with manuel pressure. Patient tolerated procedure well without any discomfort or anesthesia necessary for this wound debridement.  -Applied PRISMA and dry sterile dressing and instructed patient to continue with daily dressings at home consisting of the same with help from home aide like before and to refrain from putting dressing too tight like before -Recommend continue with  -Continue with diabetic shoes that do not rub like before -Advised patient to go to the ER or return to office if the wound worsens or if constitutional symptoms are present. -Return in 2-3 weeks for wound care.  Landis Martins, DPM

## 2020-10-23 DIAGNOSIS — Z9181 History of falling: Secondary | ICD-10-CM | POA: Diagnosis not present

## 2020-10-23 DIAGNOSIS — I700 Atherosclerosis of aorta: Secondary | ICD-10-CM | POA: Diagnosis not present

## 2020-10-23 DIAGNOSIS — E11621 Type 2 diabetes mellitus with foot ulcer: Secondary | ICD-10-CM | POA: Diagnosis not present

## 2020-10-23 DIAGNOSIS — D696 Thrombocytopenia, unspecified: Secondary | ICD-10-CM | POA: Diagnosis not present

## 2020-10-23 DIAGNOSIS — Z7982 Long term (current) use of aspirin: Secondary | ICD-10-CM | POA: Diagnosis not present

## 2020-10-23 DIAGNOSIS — K219 Gastro-esophageal reflux disease without esophagitis: Secondary | ICD-10-CM | POA: Diagnosis not present

## 2020-10-23 DIAGNOSIS — L97529 Non-pressure chronic ulcer of other part of left foot with unspecified severity: Secondary | ICD-10-CM | POA: Diagnosis not present

## 2020-10-23 DIAGNOSIS — E113293 Type 2 diabetes mellitus with mild nonproliferative diabetic retinopathy without macular edema, bilateral: Secondary | ICD-10-CM | POA: Diagnosis not present

## 2020-10-23 DIAGNOSIS — I110 Hypertensive heart disease with heart failure: Secondary | ICD-10-CM | POA: Diagnosis not present

## 2020-10-23 DIAGNOSIS — E1140 Type 2 diabetes mellitus with diabetic neuropathy, unspecified: Secondary | ICD-10-CM | POA: Diagnosis not present

## 2020-10-23 DIAGNOSIS — I340 Nonrheumatic mitral (valve) insufficiency: Secondary | ICD-10-CM | POA: Diagnosis not present

## 2020-10-23 DIAGNOSIS — Z7984 Long term (current) use of oral hypoglycemic drugs: Secondary | ICD-10-CM | POA: Diagnosis not present

## 2020-10-23 DIAGNOSIS — Z741 Need for assistance with personal care: Secondary | ICD-10-CM | POA: Diagnosis not present

## 2020-10-23 DIAGNOSIS — R269 Unspecified abnormalities of gait and mobility: Secondary | ICD-10-CM | POA: Diagnosis not present

## 2020-10-23 DIAGNOSIS — Z79899 Other long term (current) drug therapy: Secondary | ICD-10-CM | POA: Diagnosis not present

## 2020-10-23 DIAGNOSIS — I5032 Chronic diastolic (congestive) heart failure: Secondary | ICD-10-CM | POA: Diagnosis not present

## 2020-10-26 ENCOUNTER — Telehealth: Payer: Self-pay | Admitting: Sports Medicine

## 2020-10-26 DIAGNOSIS — E113293 Type 2 diabetes mellitus with mild nonproliferative diabetic retinopathy without macular edema, bilateral: Secondary | ICD-10-CM | POA: Diagnosis not present

## 2020-10-26 DIAGNOSIS — L97529 Non-pressure chronic ulcer of other part of left foot with unspecified severity: Secondary | ICD-10-CM | POA: Diagnosis not present

## 2020-10-26 DIAGNOSIS — E1140 Type 2 diabetes mellitus with diabetic neuropathy, unspecified: Secondary | ICD-10-CM | POA: Diagnosis not present

## 2020-10-26 DIAGNOSIS — I110 Hypertensive heart disease with heart failure: Secondary | ICD-10-CM | POA: Diagnosis not present

## 2020-10-26 DIAGNOSIS — I5032 Chronic diastolic (congestive) heart failure: Secondary | ICD-10-CM | POA: Diagnosis not present

## 2020-10-26 DIAGNOSIS — E11621 Type 2 diabetes mellitus with foot ulcer: Secondary | ICD-10-CM | POA: Diagnosis not present

## 2020-10-26 NOTE — Telephone Encounter (Signed)
Fax my note from last office visit over to her. It has the order in there for nursing to apply Prisma

## 2020-10-26 NOTE — Telephone Encounter (Signed)
Olivia Mackie from [facility] called stating she needed a verbal, or written order to apply the collagen. Please advise.  Fax # [phone]

## 2020-10-27 ENCOUNTER — Other Ambulatory Visit: Payer: Self-pay | Admitting: Internal Medicine

## 2020-10-27 DIAGNOSIS — L97529 Non-pressure chronic ulcer of other part of left foot with unspecified severity: Secondary | ICD-10-CM | POA: Diagnosis not present

## 2020-10-27 DIAGNOSIS — I110 Hypertensive heart disease with heart failure: Secondary | ICD-10-CM | POA: Diagnosis not present

## 2020-10-27 DIAGNOSIS — E1140 Type 2 diabetes mellitus with diabetic neuropathy, unspecified: Secondary | ICD-10-CM | POA: Diagnosis not present

## 2020-10-27 DIAGNOSIS — E113293 Type 2 diabetes mellitus with mild nonproliferative diabetic retinopathy without macular edema, bilateral: Secondary | ICD-10-CM | POA: Diagnosis not present

## 2020-10-27 DIAGNOSIS — E11621 Type 2 diabetes mellitus with foot ulcer: Secondary | ICD-10-CM | POA: Diagnosis not present

## 2020-10-27 DIAGNOSIS — I5032 Chronic diastolic (congestive) heart failure: Secondary | ICD-10-CM | POA: Diagnosis not present

## 2020-11-01 DIAGNOSIS — L97529 Non-pressure chronic ulcer of other part of left foot with unspecified severity: Secondary | ICD-10-CM | POA: Diagnosis not present

## 2020-11-01 DIAGNOSIS — E11621 Type 2 diabetes mellitus with foot ulcer: Secondary | ICD-10-CM | POA: Diagnosis not present

## 2020-11-01 DIAGNOSIS — I5032 Chronic diastolic (congestive) heart failure: Secondary | ICD-10-CM | POA: Diagnosis not present

## 2020-11-01 DIAGNOSIS — I110 Hypertensive heart disease with heart failure: Secondary | ICD-10-CM | POA: Diagnosis not present

## 2020-11-01 DIAGNOSIS — E113293 Type 2 diabetes mellitus with mild nonproliferative diabetic retinopathy without macular edema, bilateral: Secondary | ICD-10-CM | POA: Diagnosis not present

## 2020-11-01 DIAGNOSIS — E1140 Type 2 diabetes mellitus with diabetic neuropathy, unspecified: Secondary | ICD-10-CM | POA: Diagnosis not present

## 2020-11-02 ENCOUNTER — Telehealth: Payer: Self-pay | Admitting: *Deleted

## 2020-11-02 DIAGNOSIS — E11621 Type 2 diabetes mellitus with foot ulcer: Secondary | ICD-10-CM | POA: Diagnosis not present

## 2020-11-02 DIAGNOSIS — E1140 Type 2 diabetes mellitus with diabetic neuropathy, unspecified: Secondary | ICD-10-CM | POA: Diagnosis not present

## 2020-11-02 DIAGNOSIS — R300 Dysuria: Secondary | ICD-10-CM | POA: Diagnosis not present

## 2020-11-02 DIAGNOSIS — I110 Hypertensive heart disease with heart failure: Secondary | ICD-10-CM | POA: Diagnosis not present

## 2020-11-02 DIAGNOSIS — E113293 Type 2 diabetes mellitus with mild nonproliferative diabetic retinopathy without macular edema, bilateral: Secondary | ICD-10-CM | POA: Diagnosis not present

## 2020-11-02 DIAGNOSIS — L97529 Non-pressure chronic ulcer of other part of left foot with unspecified severity: Secondary | ICD-10-CM | POA: Diagnosis not present

## 2020-11-02 DIAGNOSIS — I5032 Chronic diastolic (congestive) heart failure: Secondary | ICD-10-CM | POA: Diagnosis not present

## 2020-11-02 NOTE — Telephone Encounter (Signed)
Ok I will have Wells Guiles to fax it today from [place] office for me

## 2020-11-02 NOTE — Telephone Encounter (Signed)
Heidi Dominguez w/ [facility] is requesting a new order for medical supplies(left toe) for patient. Please fax order to:[phone].

## 2020-11-09 DIAGNOSIS — E11621 Type 2 diabetes mellitus with foot ulcer: Secondary | ICD-10-CM | POA: Diagnosis not present

## 2020-11-09 DIAGNOSIS — I110 Hypertensive heart disease with heart failure: Secondary | ICD-10-CM | POA: Diagnosis not present

## 2020-11-09 DIAGNOSIS — E1140 Type 2 diabetes mellitus with diabetic neuropathy, unspecified: Secondary | ICD-10-CM | POA: Diagnosis not present

## 2020-11-09 DIAGNOSIS — E113293 Type 2 diabetes mellitus with mild nonproliferative diabetic retinopathy without macular edema, bilateral: Secondary | ICD-10-CM | POA: Diagnosis not present

## 2020-11-09 DIAGNOSIS — L97529 Non-pressure chronic ulcer of other part of left foot with unspecified severity: Secondary | ICD-10-CM | POA: Diagnosis not present

## 2020-11-09 DIAGNOSIS — I5032 Chronic diastolic (congestive) heart failure: Secondary | ICD-10-CM | POA: Diagnosis not present

## 2020-11-11 ENCOUNTER — Ambulatory Visit (INDEPENDENT_AMBULATORY_CARE_PROVIDER_SITE_OTHER): Payer: Medicare Other | Admitting: Sports Medicine

## 2020-11-11 ENCOUNTER — Encounter: Payer: Self-pay | Admitting: Sports Medicine

## 2020-11-11 ENCOUNTER — Other Ambulatory Visit: Payer: Self-pay

## 2020-11-11 DIAGNOSIS — L97521 Non-pressure chronic ulcer of other part of left foot limited to breakdown of skin: Secondary | ICD-10-CM

## 2020-11-11 DIAGNOSIS — M2012 Hallux valgus (acquired), left foot: Secondary | ICD-10-CM

## 2020-11-11 DIAGNOSIS — E1142 Type 2 diabetes mellitus with diabetic polyneuropathy: Secondary | ICD-10-CM

## 2020-11-11 DIAGNOSIS — M21612 Bunion of left foot: Secondary | ICD-10-CM

## 2020-11-11 DIAGNOSIS — E11621 Type 2 diabetes mellitus with foot ulcer: Secondary | ICD-10-CM | POA: Diagnosis not present

## 2020-11-11 DIAGNOSIS — E0843 Diabetes mellitus due to underlying condition with diabetic autonomic (poly)neuropathy: Secondary | ICD-10-CM

## 2020-11-11 NOTE — Progress Notes (Signed)
Subjective: Heidi Dominguez is a 87 y.o. female patient with history of diabetes who returns to office today for wound care. Patient is assisted by aide, denies pain, nausea, vomiting, fever, chills, or any other symptoms at this time.   Patient Active Problem List   Diagnosis Date Noted  . Diabetic foot ulcer ([place]) 09/02/2020  . Diabetic neuropathy ([place]) 09/02/2020  . Gait abnormality 09/02/2020  . Diabetic retinopathy associated with type 2 diabetes mellitus ([place]) 09/02/2020  . Hardening of the aorta (main artery of the heart) ([place]) 09/02/2020  . Pure hypercholesterolemia 09/02/2020  . Thrombocytopenia ([place]) 09/02/2020  . Gastroesophageal reflux disease 10/22/2019  . Unspecified chronic bronchitis ([place]) 09/02/2019  . Infection or inflammatory reaction due to internal joint prosthesis ([place]) 02/20/2019  . Rhinitis, chronic 01/07/2019  . Infection of shoulder ([place]) 11/08/2018  . History of hemiarthroplasty of right shoulder 09/25/2018  . UTI (urinary tract infection) 09/12/2018  . Acute pancreatitis 08/28/2018  . Acute recurrent pancreatitis   . Abdominal pain 08/23/2018  . Elevated LFTs 07/04/2018  . CKD (chronic kidney disease) stage 3, GFR 30-59 ml/min (HCC) 07/04/2018  . Upper airway cough syndrome 03/13/2018  . Sensorineural hearing loss (SNHL), bilateral 12/04/2016  . Post-nasal drainage 03/14/2016  . Presbycusis of both ears 03/14/2016  . Bilateral hearing loss 10/06/2015  . Bilateral impacted cerumen 10/06/2015  . 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 Subjective tinnitus of both ears 10/06/2015  . Choledocholithiasis with chronic cholecystitis, nonobstructing 04/11/2015  . Pre-operative cardiovascular examination, high risk surgery   . Epigastric abdominal pain 04/08/2015  . Acute gallstone pancreatitis s/p lap chole w [facility] 04/11/2015 04/08/2015  . Diabetes mellitus type 2, controlled ([place]) 04/08/2015  . Gallstone pancreatitis 04/08/2015  . Mitral  regurgitation 04/08/2015  . Chronic diastolic CHF (congestive heart failure) ([place]) 04/08/2015  . DOE (dyspnea on exertion) 03/31/2015  . Ischemic colitis ([place]) 08/09/2012  . Colitis 08/07/2012  . Rectal bleeding 08/07/2012  . Weakness 09/24/2011  . Bradycardia 09/24/2011  . Acute lower UTI 09/24/2011  . Hematochezia 09/24/2011  . Essential hypertension 09/24/2011  . Dyslipidemia 09/24/2011   Current Outpatient Medications on File Prior to Visit  Medication Sig Dispense Refill  . albuterol (PROVENTIL HFA;VENTOLIN HFA) 108 (90 Base) MCG/ACT inhaler Inhale into the lungs every 6 (six) hours as needed for wheezing or shortness of breath.    Marland Kitchen amLODipine (NORVASC) 10 MG tablet TK 1 T PO QD    . amoxicillin-clavulanate (AUGMENTIN) 500-125 MG tablet Take 1 tablet (500 mg total) by mouth 3 (three) times daily. 28 tablet 0  . aspirin EC 81 MG tablet Take 81 mg by mouth daily.    . budesonide-formoterol (SYMBICORT) 160-4.5 MCG/ACT inhaler 2 puffs    . Carboxymethylcellulose Sod PF (REFRESH PLUS) 0.5 % SOLN See admin instructions.    . Cholecalciferol (VITAMIN D-3) 125 MCG (5000 UT) TABS Take 5,000 Units by mouth daily.    . ciprofloxacin (CIPRO) 500 MG tablet SMARTSIG:1 Tablet(s) By Mouth Every 12 Hours    . colchicine 0.6 MG tablet Take 1 tablet (0.6 mg total) by mouth daily. 14 tablet 0  . doxycycline (VIBRA-TABS) 100 MG tablet Take 1 tablet (100 mg total) by mouth 2 (two) times daily. 20 tablet 0  . erythromycin ophthalmic ointment erythromycin 5 mg/gram (0.5 %) eye ointment  APPLY A SMALL AMOUNT INTO THE RIGHT EYE FOUR TIMES DAILY AS NEEDED FOR PAIN    . famotidine (PEPCID) 20 MG tablet Take one after  supper 30  tablet 2  . fentaNYL (DURAGESIC) 25 MCG/HR APP 1 PA EXT TO THE SKIN Q 72 H    . Ferrous Gluconate (IRON) 240 (27 FE) MG TABS Take 240 mg by mouth daily.     . fluconazole (DIFLUCAN) 100 MG tablet fluconazole 100 mg tablet  TAKE 1 TABLET BY MOUTH EVERY DAY FOR 1 DAY    . furosemide  (LASIX) 20 MG tablet furosemide 20 mg tablet    . hydrocortisone (ANUSOL-HC) 2.5 % rectal cream Apply topically 2 (two) times daily.    . hydrocortisone 2.5 % cream hydrocortisone 2.5 % topical cream with perineal applicator    . ibuprofen (ADVIL) 200 MG tablet 1 tablet with food or milk as needed    . JANUVIA 50 MG tablet Take 50 mg by mouth daily.   5  . levalbuterol (XOPENEX) 1.25 MG/3ML nebulizer solution 1 vial in neb every 4 hours as needed    . lidocaine (XYLOCAINE) 5 % ointment APPLY TOPICALLY AS NEEDED. TOES FOR PAIN 35.44 g 0  . lisinopril (ZESTRIL) 5 MG tablet lisinopril 5 mg tablet  TK 1 T PO QD    . loratadine (CLARITIN) 10 MG tablet 1 tablet    . losartan (COZAAR) 25 MG tablet Take 25 mg by mouth daily.    Marland Kitchen losartan (COZAAR) 50 MG tablet TAKE 1 TABLET(50 MG) BY MOUTH DAILY 30 tablet 0  . Magnesium 250 MG TABS Take by mouth.    . Multiple Vitamins-Minerals (PRESERVISION AREDS 2) CAPS     . mupirocin ointment (BACTROBAN) 2 % Apply topically daily.    . Omega 3 1000 MG CAPS 1 capsule    . ONETOUCH VERIO test strip USE TO CHECK BLOOD SUGAR ONCE DAILY.    . pantoprazole (PROTONIX) 40 MG tablet Take 1 tablet (40 mg total) by mouth daily. Take 30-60 min before first meal of the day 30 tablet 5  . potassium chloride (K-DUR) 10 MEQ tablet TK 1 T PO  ONCE A DAY    . sulfamethoxazole-trimethoprim (BACTRIM) 400-80 MG tablet Take 1 tablet by mouth 2 (two) times daily.    Carren Rang COMPLETE 0.6 % SOLN     . UNABLE TO FIND     . UNABLE TO FIND albuterol sulfate HFA 90 mcg/actuation aerosol inhaler  TAKE 2 PUFFS AS NEEDED EVERY 4 HOURS    . vitamin B-12 (CYANOCOBALAMIN) 1000 MCG tablet Take 1,000 mcg by mouth daily.    . vitamin B-12 (CYANOCOBALAMIN) 500 MCG tablet      No current facility-administered medications on file prior to visit.   Allergies  Allergen Reactions  . Amoxicillin-Pot Clavulanate Diarrhea  . Catapres [Clonidine Hcl] Other (See Comments)    Unknown. Unknown.  .  Codeine Other (See Comments)    "Makes me out of my head, loopy"  . Covera-Hs [Verapamil Hcl] Other (See Comments)    Unknown.  Marland Kitchen Hydrocodone Other (See Comments)    "makes me go out of my head, loopy"  . Other Other (See Comments)    Any type of narcotics Feels "loopy" Any type of narcotics Feels "loopy"  . Sulfa Antibiotics Itching  . Sulfamethoxazole Diarrhea  . Verapamil Diarrhea and Other (See Comments)  . Sulfasalazine Itching    No results found for this or any previous visit (from the past 2160 hour(s)).  Objective: General: Patient is awake, alert, and oriented x 3 and in no acute distress.  Integument: Skin is warm, dry and supple bilateral. Nails are short and  dystrophic with subungual debris, consistent with onychomycosis, 1-5 bilateral. +ulceration at left 1st toe that measures 1 x 1.2 cm with fatty tissue exposed at medial hallux post debridement with no swelling, decreased warmth, minimal active bloody drainage, No odor.  No other acute signs of infection.  There is a small dry blood blister noted at the distal tuft of the right hallux with no accumulation and no surrounding signs of infection. No opening to this area.   Vasculature:  Dorsalis Pedis pulse 1/4 bilateral. Posterior Tibial pulse  0/4 bilateral. Capillary fill time <3 sec 1-5 bilateral. No hair growth to the level of the digits.Temperature gradient within normal limits. + varicosities present bilateral. Trace edema present bilateral.   Neurology: Protective sensation slightly diminished bilateral.   Musculoskeletal:+ decreased pain to left 1st toe at ulceration.  + Bunion, hammertoes, pes planus asymptomatic pedal deformities noted bilateral. Muscular strength 4/5 in all lower extremity muscular groups bilateral without pain on range of motion.   Assessment and Plan: Problem List Items Addressed This Visit      Endocrine   Diabetic foot ulcer ([place]) - Primary   Relevant Medications   losartan (COZAAR) 25  MG tablet    Other Visit Diagnoses    Diabetic peripheral neuropathy associated with type 2 diabetes mellitus (HCC)       Relevant Medications   losartan (COZAAR) 25 MG tablet   Hallux valgus with bunions, left       Diabetes mellitus due to underlying condition with diabetic autonomic neuropathy, unspecified whether long term insulin use (HCC)       Relevant Medications   losartan (COZAAR) 25 MG tablet     -Examined patient. -Applied Betadine to the right great toe -Excisionally dedbrided ulceration at left 1st toe to healthy bleeding borders removing nonviable tissue using a tissue nipper, Wound measures post debridement as above, Wound was debrided to the level of the dermis with viable wound base exposed to promote healing. Hemostasis was achieved with manuel pressure. Patient tolerated procedure well without any discomfort or anesthesia necessary for this wound debridement.  -Applied PRISMA and dry sterile dressing and instructed patient to continue with dressing changes a few times per week with help from nurse/aid; orders provided to the patient to give to the staff -Continue with diabetic shoes that do not rub like before -Advised patient to go to the ER or return to office if the wound worsens or if constitutional symptoms are present. -Return in 2-3 weeks for wound care.  Landis Martins, DPM

## 2020-11-12 DIAGNOSIS — L97529 Non-pressure chronic ulcer of other part of left foot with unspecified severity: Secondary | ICD-10-CM | POA: Diagnosis not present

## 2020-11-12 DIAGNOSIS — I5032 Chronic diastolic (congestive) heart failure: Secondary | ICD-10-CM | POA: Diagnosis not present

## 2020-11-12 DIAGNOSIS — E1140 Type 2 diabetes mellitus with diabetic neuropathy, unspecified: Secondary | ICD-10-CM | POA: Diagnosis not present

## 2020-11-12 DIAGNOSIS — I110 Hypertensive heart disease with heart failure: Secondary | ICD-10-CM | POA: Diagnosis not present

## 2020-11-12 DIAGNOSIS — E113293 Type 2 diabetes mellitus with mild nonproliferative diabetic retinopathy without macular edema, bilateral: Secondary | ICD-10-CM | POA: Diagnosis not present

## 2020-11-12 DIAGNOSIS — E11621 Type 2 diabetes mellitus with foot ulcer: Secondary | ICD-10-CM | POA: Diagnosis not present

## 2020-11-15 DIAGNOSIS — I110 Hypertensive heart disease with heart failure: Secondary | ICD-10-CM | POA: Diagnosis not present

## 2020-11-15 DIAGNOSIS — E11621 Type 2 diabetes mellitus with foot ulcer: Secondary | ICD-10-CM | POA: Diagnosis not present

## 2020-11-15 DIAGNOSIS — E113293 Type 2 diabetes mellitus with mild nonproliferative diabetic retinopathy without macular edema, bilateral: Secondary | ICD-10-CM | POA: Diagnosis not present

## 2020-11-15 DIAGNOSIS — I5032 Chronic diastolic (congestive) heart failure: Secondary | ICD-10-CM | POA: Diagnosis not present

## 2020-11-15 DIAGNOSIS — L97529 Non-pressure chronic ulcer of other part of left foot with unspecified severity: Secondary | ICD-10-CM | POA: Diagnosis not present

## 2020-11-15 DIAGNOSIS — E1140 Type 2 diabetes mellitus with diabetic neuropathy, unspecified: Secondary | ICD-10-CM | POA: Diagnosis not present

## 2020-11-16 DIAGNOSIS — E11621 Type 2 diabetes mellitus with foot ulcer: Secondary | ICD-10-CM | POA: Diagnosis not present

## 2020-11-16 DIAGNOSIS — I110 Hypertensive heart disease with heart failure: Secondary | ICD-10-CM | POA: Diagnosis not present

## 2020-11-16 DIAGNOSIS — L97529 Non-pressure chronic ulcer of other part of left foot with unspecified severity: Secondary | ICD-10-CM | POA: Diagnosis not present

## 2020-11-16 DIAGNOSIS — I5032 Chronic diastolic (congestive) heart failure: Secondary | ICD-10-CM | POA: Diagnosis not present

## 2020-11-16 DIAGNOSIS — E113293 Type 2 diabetes mellitus with mild nonproliferative diabetic retinopathy without macular edema, bilateral: Secondary | ICD-10-CM | POA: Diagnosis not present

## 2020-11-16 DIAGNOSIS — E1140 Type 2 diabetes mellitus with diabetic neuropathy, unspecified: Secondary | ICD-10-CM | POA: Diagnosis not present

## 2020-11-19 DIAGNOSIS — I5032 Chronic diastolic (congestive) heart failure: Secondary | ICD-10-CM | POA: Diagnosis not present

## 2020-11-19 DIAGNOSIS — E11621 Type 2 diabetes mellitus with foot ulcer: Secondary | ICD-10-CM | POA: Diagnosis not present

## 2020-11-19 DIAGNOSIS — L97529 Non-pressure chronic ulcer of other part of left foot with unspecified severity: Secondary | ICD-10-CM | POA: Diagnosis not present

## 2020-11-19 DIAGNOSIS — E113293 Type 2 diabetes mellitus with mild nonproliferative diabetic retinopathy without macular edema, bilateral: Secondary | ICD-10-CM | POA: Diagnosis not present

## 2020-11-19 DIAGNOSIS — I110 Hypertensive heart disease with heart failure: Secondary | ICD-10-CM | POA: Diagnosis not present

## 2020-11-19 DIAGNOSIS — E1140 Type 2 diabetes mellitus with diabetic neuropathy, unspecified: Secondary | ICD-10-CM | POA: Diagnosis not present

## 2020-11-22 DIAGNOSIS — E11621 Type 2 diabetes mellitus with foot ulcer: Secondary | ICD-10-CM | POA: Diagnosis not present

## 2020-11-22 DIAGNOSIS — Z9181 History of falling: Secondary | ICD-10-CM | POA: Diagnosis not present

## 2020-11-22 DIAGNOSIS — I700 Atherosclerosis of aorta: Secondary | ICD-10-CM | POA: Diagnosis not present

## 2020-11-22 DIAGNOSIS — E1140 Type 2 diabetes mellitus with diabetic neuropathy, unspecified: Secondary | ICD-10-CM | POA: Diagnosis not present

## 2020-11-22 DIAGNOSIS — E113293 Type 2 diabetes mellitus with mild nonproliferative diabetic retinopathy without macular edema, bilateral: Secondary | ICD-10-CM | POA: Diagnosis not present

## 2020-11-22 DIAGNOSIS — L97529 Non-pressure chronic ulcer of other part of left foot with unspecified severity: Secondary | ICD-10-CM | POA: Diagnosis not present

## 2020-11-22 DIAGNOSIS — I110 Hypertensive heart disease with heart failure: Secondary | ICD-10-CM | POA: Diagnosis not present

## 2020-11-22 DIAGNOSIS — K219 Gastro-esophageal reflux disease without esophagitis: Secondary | ICD-10-CM | POA: Diagnosis not present

## 2020-11-22 DIAGNOSIS — R269 Unspecified abnormalities of gait and mobility: Secondary | ICD-10-CM | POA: Diagnosis not present

## 2020-11-22 DIAGNOSIS — Z7984 Long term (current) use of oral hypoglycemic drugs: Secondary | ICD-10-CM | POA: Diagnosis not present

## 2020-11-22 DIAGNOSIS — Z7982 Long term (current) use of aspirin: Secondary | ICD-10-CM | POA: Diagnosis not present

## 2020-11-22 DIAGNOSIS — D696 Thrombocytopenia, unspecified: Secondary | ICD-10-CM | POA: Diagnosis not present

## 2020-11-22 DIAGNOSIS — Z79899 Other long term (current) drug therapy: Secondary | ICD-10-CM | POA: Diagnosis not present

## 2020-11-22 DIAGNOSIS — I340 Nonrheumatic mitral (valve) insufficiency: Secondary | ICD-10-CM | POA: Diagnosis not present

## 2020-11-22 DIAGNOSIS — Z741 Need for assistance with personal care: Secondary | ICD-10-CM | POA: Diagnosis not present

## 2020-11-22 DIAGNOSIS — I5032 Chronic diastolic (congestive) heart failure: Secondary | ICD-10-CM | POA: Diagnosis not present

## 2020-11-23 DIAGNOSIS — E11621 Type 2 diabetes mellitus with foot ulcer: Secondary | ICD-10-CM | POA: Diagnosis not present

## 2020-11-23 DIAGNOSIS — I5032 Chronic diastolic (congestive) heart failure: Secondary | ICD-10-CM | POA: Diagnosis not present

## 2020-11-23 DIAGNOSIS — E1140 Type 2 diabetes mellitus with diabetic neuropathy, unspecified: Secondary | ICD-10-CM | POA: Diagnosis not present

## 2020-11-23 DIAGNOSIS — I110 Hypertensive heart disease with heart failure: Secondary | ICD-10-CM | POA: Diagnosis not present

## 2020-11-23 DIAGNOSIS — E113293 Type 2 diabetes mellitus with mild nonproliferative diabetic retinopathy without macular edema, bilateral: Secondary | ICD-10-CM | POA: Diagnosis not present

## 2020-11-23 DIAGNOSIS — L97529 Non-pressure chronic ulcer of other part of left foot with unspecified severity: Secondary | ICD-10-CM | POA: Diagnosis not present

## 2020-11-24 DIAGNOSIS — E113293 Type 2 diabetes mellitus with mild nonproliferative diabetic retinopathy without macular edema, bilateral: Secondary | ICD-10-CM | POA: Diagnosis not present

## 2020-11-24 DIAGNOSIS — I110 Hypertensive heart disease with heart failure: Secondary | ICD-10-CM | POA: Diagnosis not present

## 2020-11-24 DIAGNOSIS — L97529 Non-pressure chronic ulcer of other part of left foot with unspecified severity: Secondary | ICD-10-CM | POA: Diagnosis not present

## 2020-11-24 DIAGNOSIS — I5032 Chronic diastolic (congestive) heart failure: Secondary | ICD-10-CM | POA: Diagnosis not present

## 2020-11-24 DIAGNOSIS — E1140 Type 2 diabetes mellitus with diabetic neuropathy, unspecified: Secondary | ICD-10-CM | POA: Diagnosis not present

## 2020-11-24 DIAGNOSIS — E11621 Type 2 diabetes mellitus with foot ulcer: Secondary | ICD-10-CM | POA: Diagnosis not present

## 2020-11-26 DIAGNOSIS — E11621 Type 2 diabetes mellitus with foot ulcer: Secondary | ICD-10-CM | POA: Diagnosis not present

## 2020-11-26 DIAGNOSIS — I5032 Chronic diastolic (congestive) heart failure: Secondary | ICD-10-CM | POA: Diagnosis not present

## 2020-11-26 DIAGNOSIS — E1140 Type 2 diabetes mellitus with diabetic neuropathy, unspecified: Secondary | ICD-10-CM | POA: Diagnosis not present

## 2020-11-26 DIAGNOSIS — E113293 Type 2 diabetes mellitus with mild nonproliferative diabetic retinopathy without macular edema, bilateral: Secondary | ICD-10-CM | POA: Diagnosis not present

## 2020-11-26 DIAGNOSIS — L97529 Non-pressure chronic ulcer of other part of left foot with unspecified severity: Secondary | ICD-10-CM | POA: Diagnosis not present

## 2020-11-26 DIAGNOSIS — I110 Hypertensive heart disease with heart failure: Secondary | ICD-10-CM | POA: Diagnosis not present

## 2020-11-29 DIAGNOSIS — I5032 Chronic diastolic (congestive) heart failure: Secondary | ICD-10-CM | POA: Diagnosis not present

## 2020-11-29 DIAGNOSIS — L97529 Non-pressure chronic ulcer of other part of left foot with unspecified severity: Secondary | ICD-10-CM | POA: Diagnosis not present

## 2020-11-29 DIAGNOSIS — I110 Hypertensive heart disease with heart failure: Secondary | ICD-10-CM | POA: Diagnosis not present

## 2020-11-29 DIAGNOSIS — E113293 Type 2 diabetes mellitus with mild nonproliferative diabetic retinopathy without macular edema, bilateral: Secondary | ICD-10-CM | POA: Diagnosis not present

## 2020-11-29 DIAGNOSIS — E1140 Type 2 diabetes mellitus with diabetic neuropathy, unspecified: Secondary | ICD-10-CM | POA: Diagnosis not present

## 2020-11-29 DIAGNOSIS — E11621 Type 2 diabetes mellitus with foot ulcer: Secondary | ICD-10-CM | POA: Diagnosis not present

## 2020-11-30 DIAGNOSIS — E1140 Type 2 diabetes mellitus with diabetic neuropathy, unspecified: Secondary | ICD-10-CM | POA: Diagnosis not present

## 2020-11-30 DIAGNOSIS — E11621 Type 2 diabetes mellitus with foot ulcer: Secondary | ICD-10-CM | POA: Diagnosis not present

## 2020-11-30 DIAGNOSIS — L97529 Non-pressure chronic ulcer of other part of left foot with unspecified severity: Secondary | ICD-10-CM | POA: Diagnosis not present

## 2020-11-30 DIAGNOSIS — I110 Hypertensive heart disease with heart failure: Secondary | ICD-10-CM | POA: Diagnosis not present

## 2020-11-30 DIAGNOSIS — E113293 Type 2 diabetes mellitus with mild nonproliferative diabetic retinopathy without macular edema, bilateral: Secondary | ICD-10-CM | POA: Diagnosis not present

## 2020-11-30 DIAGNOSIS — I5032 Chronic diastolic (congestive) heart failure: Secondary | ICD-10-CM | POA: Diagnosis not present

## 2020-12-02 DIAGNOSIS — E113293 Type 2 diabetes mellitus with mild nonproliferative diabetic retinopathy without macular edema, bilateral: Secondary | ICD-10-CM | POA: Diagnosis not present

## 2020-12-02 DIAGNOSIS — E11621 Type 2 diabetes mellitus with foot ulcer: Secondary | ICD-10-CM | POA: Diagnosis not present

## 2020-12-02 DIAGNOSIS — I5032 Chronic diastolic (congestive) heart failure: Secondary | ICD-10-CM | POA: Diagnosis not present

## 2020-12-02 DIAGNOSIS — E1140 Type 2 diabetes mellitus with diabetic neuropathy, unspecified: Secondary | ICD-10-CM | POA: Diagnosis not present

## 2020-12-02 DIAGNOSIS — L97529 Non-pressure chronic ulcer of other part of left foot with unspecified severity: Secondary | ICD-10-CM | POA: Diagnosis not present

## 2020-12-02 DIAGNOSIS — I110 Hypertensive heart disease with heart failure: Secondary | ICD-10-CM | POA: Diagnosis not present

## 2020-12-06 ENCOUNTER — Telehealth: Payer: Self-pay | Admitting: *Deleted

## 2020-12-06 ENCOUNTER — Other Ambulatory Visit: Payer: Self-pay | Admitting: Geriatric Medicine

## 2020-12-06 DIAGNOSIS — E1140 Type 2 diabetes mellitus with diabetic neuropathy, unspecified: Secondary | ICD-10-CM | POA: Diagnosis not present

## 2020-12-06 DIAGNOSIS — L97529 Non-pressure chronic ulcer of other part of left foot with unspecified severity: Secondary | ICD-10-CM | POA: Diagnosis not present

## 2020-12-06 DIAGNOSIS — E11621 Type 2 diabetes mellitus with foot ulcer: Secondary | ICD-10-CM

## 2020-12-06 DIAGNOSIS — E113293 Type 2 diabetes mellitus with mild nonproliferative diabetic retinopathy without macular edema, bilateral: Secondary | ICD-10-CM | POA: Diagnosis not present

## 2020-12-06 DIAGNOSIS — I110 Hypertensive heart disease with heart failure: Secondary | ICD-10-CM | POA: Diagnosis not present

## 2020-12-06 DIAGNOSIS — I5032 Chronic diastolic (congestive) heart failure: Secondary | ICD-10-CM | POA: Diagnosis not present

## 2020-12-06 DIAGNOSIS — I10 Essential (primary) hypertension: Secondary | ICD-10-CM | POA: Diagnosis not present

## 2020-12-06 NOTE — Telephone Encounter (Addendum)
Tracey with [facility] is calling because patient's daughter has some concerns about the wound not healing saying that it has been a year. She is requesting patient be sent to wound center. She also said that the weekend home health caregiver is using isosorbide and not following the prism orders by physician. Please advise.

## 2020-12-07 ENCOUNTER — Ambulatory Visit
Admission: RE | Admit: 2020-12-07 | Discharge: 2020-12-07 | Disposition: A | Discharge status: Home / Self Care | Payer: Medicare Other | Source: Ambulatory Visit | Attending: Geriatric Medicine | Admitting: Geriatric Medicine

## 2020-12-07 ENCOUNTER — Other Ambulatory Visit: Payer: Self-pay | Admitting: Geriatric Medicine

## 2020-12-07 DIAGNOSIS — E11621 Type 2 diabetes mellitus with foot ulcer: Secondary | ICD-10-CM

## 2020-12-07 DIAGNOSIS — I70203 Unspecified atherosclerosis of native arteries of extremities, bilateral legs: Secondary | ICD-10-CM | POA: Diagnosis not present

## 2020-12-07 DIAGNOSIS — L97529 Non-pressure chronic ulcer of other part of left foot with unspecified severity: Secondary | ICD-10-CM

## 2020-12-07 DIAGNOSIS — H353211 Exudative age-related macular degeneration, right eye, with active choroidal neovascularization: Secondary | ICD-10-CM | POA: Diagnosis not present

## 2020-12-07 DIAGNOSIS — Z872 Personal history of diseases of the skin and subcutaneous tissue: Secondary | ICD-10-CM | POA: Diagnosis not present

## 2020-12-07 NOTE — Telephone Encounter (Signed)
Called and left Vmessage for Tracey , nurse with [facility], to call back.to give information per Dr Cannon Kettle.

## 2020-12-08 NOTE — Telephone Encounter (Signed)
Returned call to patient's nurse and informed per Dr Cannon Kettle concerning patient, verbalized understanding, said ok.

## 2020-12-09 ENCOUNTER — Ambulatory Visit (INDEPENDENT_AMBULATORY_CARE_PROVIDER_SITE_OTHER): Payer: Medicare Other | Admitting: Sports Medicine

## 2020-12-09 ENCOUNTER — Other Ambulatory Visit: Payer: Medicare Other

## 2020-12-09 ENCOUNTER — Other Ambulatory Visit: Payer: Self-pay

## 2020-12-09 ENCOUNTER — Encounter: Payer: Self-pay | Admitting: Sports Medicine

## 2020-12-09 DIAGNOSIS — L97502 Non-pressure chronic ulcer of other part of unspecified foot with fat layer exposed: Secondary | ICD-10-CM

## 2020-12-09 DIAGNOSIS — M2012 Hallux valgus (acquired), left foot: Secondary | ICD-10-CM

## 2020-12-09 DIAGNOSIS — I739 Peripheral vascular disease, unspecified: Secondary | ICD-10-CM

## 2020-12-09 DIAGNOSIS — E08621 Diabetes mellitus due to underlying condition with foot ulcer: Secondary | ICD-10-CM

## 2020-12-09 DIAGNOSIS — M21612 Bunion of left foot: Secondary | ICD-10-CM

## 2020-12-09 DIAGNOSIS — E1142 Type 2 diabetes mellitus with diabetic polyneuropathy: Secondary | ICD-10-CM

## 2020-12-09 DIAGNOSIS — E0843 Diabetes mellitus due to underlying condition with diabetic autonomic (poly)neuropathy: Secondary | ICD-10-CM

## 2020-12-09 NOTE — Progress Notes (Signed)
Subjective: IRA BUSBIN is a 87 y.o. female patient with history of diabetes who returns to office today for wound care. Patient is assisted by daughter who reports that she got ABI results for me to review that's confusing, denies pain, nausea, vomiting, fever, chills, or any other symptoms at this time.   Patient Active Problem List   Diagnosis Date Noted  . Diabetic foot ulcer ([place]) 09/02/2020  . Diabetic neuropathy ([place]) 09/02/2020  . Gait abnormality 09/02/2020  . Diabetic retinopathy associated with type 2 diabetes mellitus ([place]) 09/02/2020  . Hardening of the aorta (main artery of the heart) ([place]) 09/02/2020  . Pure hypercholesterolemia 09/02/2020  . Thrombocytopenia ([place]) 09/02/2020  . Gastroesophageal reflux disease 10/22/2019  . Unspecified chronic bronchitis ([place]) 09/02/2019  . Infection or inflammatory reaction due to internal joint prosthesis ([place]) 02/20/2019  . Rhinitis, chronic 01/07/2019  . Infection of shoulder ([place]) 11/08/2018  . History of hemiarthroplasty of right shoulder 09/25/2018  . UTI (urinary tract infection) 09/12/2018  . Acute pancreatitis 08/28/2018  . Acute recurrent pancreatitis   . Abdominal pain 08/23/2018  . Elevated LFTs 07/04/2018  . CKD (chronic kidney disease) stage 3, GFR 30-59 ml/min (HCC) 07/04/2018  . Upper airway cough syndrome 03/13/2018  . Sensorineural hearing loss (SNHL), bilateral 12/04/2016  . Post-nasal drainage 03/14/2016  . Presbycusis of both ears 03/14/2016  . Bilateral hearing loss 10/06/2015  . Bilateral impacted cerumen 10/06/2015  . 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 Subjective tinnitus of both ears 10/06/2015  . Choledocholithiasis with chronic cholecystitis, nonobstructing 04/11/2015  . Pre-operative cardiovascular examination, high risk surgery   . Epigastric abdominal pain 04/08/2015  . Acute gallstone pancreatitis s/p lap chole w [facility] 04/11/2015 04/08/2015  . Diabetes mellitus type 2,  controlled ([place]) 04/08/2015  . Gallstone pancreatitis 04/08/2015  . Mitral regurgitation 04/08/2015  . Chronic diastolic CHF (congestive heart failure) ([place]) 04/08/2015  . DOE (dyspnea on exertion) 03/31/2015  . Ischemic colitis ([place]) 08/09/2012  . Colitis 08/07/2012  . Rectal bleeding 08/07/2012  . Weakness 09/24/2011  . Bradycardia 09/24/2011  . Acute lower UTI 09/24/2011  . Hematochezia 09/24/2011  . Essential hypertension 09/24/2011  . Dyslipidemia 09/24/2011   Current Outpatient Medications on File Prior to Visit  Medication Sig Dispense Refill  . albuterol (PROVENTIL HFA;VENTOLIN HFA) 108 (90 Base) MCG/ACT inhaler Inhale into the lungs every 6 (six) hours as needed for wheezing or shortness of breath.    Marland Kitchen amLODipine (NORVASC) 10 MG tablet TK 1 T PO QD    . amoxicillin-clavulanate (AUGMENTIN) 500-125 MG tablet Take 1 tablet (500 mg total) by mouth 3 (three) times daily. 28 tablet 0  . aspirin EC 81 MG tablet Take 81 mg by mouth daily.    . budesonide-formoterol (SYMBICORT) 160-4.5 MCG/ACT inhaler 2 puffs    . Carboxymethylcellulose Sod PF (REFRESH PLUS) 0.5 % SOLN See admin instructions.    . Cholecalciferol (VITAMIN D-3) 125 MCG (5000 UT) TABS Take 5,000 Units by mouth daily.    . ciprofloxacin (CIPRO) 500 MG tablet SMARTSIG:1 Tablet(s) By Mouth Every 12 Hours    . colchicine 0.6 MG tablet Take 1 tablet (0.6 mg total) by mouth daily. 14 tablet 0  . doxycycline (VIBRA-TABS) 100 MG tablet Take 1 tablet (100 mg total) by mouth 2 (two) times daily. 20 tablet 0  . erythromycin ophthalmic ointment erythromycin 5 mg/gram (0.5 %) eye ointment  APPLY A SMALL AMOUNT INTO THE RIGHT EYE FOUR TIMES DAILY AS NEEDED FOR PAIN    .  famotidine (PEPCID) 20 MG tablet Take one after  supper 30 tablet 2  . fentaNYL (DURAGESIC) 25 MCG/HR APP 1 PA EXT TO THE SKIN Q 72 H    . Ferrous Gluconate (IRON) 240 (27 FE) MG TABS Take 240 mg by mouth daily.     . fluconazole (DIFLUCAN) 100 MG tablet fluconazole  100 mg tablet  TAKE 1 TABLET BY MOUTH EVERY DAY FOR 1 DAY    . furosemide (LASIX) 20 MG tablet furosemide 20 mg tablet    . hydrocortisone (ANUSOL-HC) 2.5 % rectal cream Apply topically 2 (two) times daily.    . hydrocortisone 2.5 % cream hydrocortisone 2.5 % topical cream with perineal applicator    . ibuprofen (ADVIL) 200 MG tablet 1 tablet with food or milk as needed    . JANUVIA 50 MG tablet Take 50 mg by mouth daily.   5  . levalbuterol (XOPENEX) 1.25 MG/3ML nebulizer solution 1 vial in neb every 4 hours as needed    . lidocaine (XYLOCAINE) 5 % ointment APPLY TOPICALLY AS NEEDED. TOES FOR PAIN 35.44 g 0  . lisinopril (ZESTRIL) 5 MG tablet lisinopril 5 mg tablet  TK 1 T PO QD    . loratadine (CLARITIN) 10 MG tablet 1 tablet    . losartan (COZAAR) 25 MG tablet Take 25 mg by mouth daily.    Marland Kitchen losartan (COZAAR) 50 MG tablet TAKE 1 TABLET(50 MG) BY MOUTH DAILY 30 tablet 0  . Magnesium 250 MG TABS Take by mouth.    . Multiple Vitamins-Minerals (PRESERVISION AREDS 2) CAPS     . mupirocin ointment (BACTROBAN) 2 % Apply topically daily.    . Omega 3 1000 MG CAPS 1 capsule    . ONETOUCH VERIO test strip USE TO CHECK BLOOD SUGAR ONCE DAILY.    . pantoprazole (PROTONIX) 40 MG tablet Take 1 tablet (40 mg total) by mouth daily. Take 30-60 min before first meal of the day 30 tablet 5  . potassium chloride (K-DUR) 10 MEQ tablet TK 1 T PO  ONCE A DAY    . sulfamethoxazole-trimethoprim (BACTRIM) 400-80 MG tablet Take 1 tablet by mouth 2 (two) times daily.    Carren Rang COMPLETE 0.6 % SOLN     . UNABLE TO FIND     . UNABLE TO FIND albuterol sulfate HFA 90 mcg/actuation aerosol inhaler  TAKE 2 PUFFS AS NEEDED EVERY 4 HOURS    . vitamin B-12 (CYANOCOBALAMIN) 1000 MCG tablet Take 1,000 mcg by mouth daily.    . vitamin B-12 (CYANOCOBALAMIN) 500 MCG tablet      No current facility-administered medications on file prior to visit.   Allergies  Allergen Reactions  . Amoxicillin-Pot Clavulanate Diarrhea  .  Catapres [Clonidine Hcl] Other (See Comments)    Unknown. Unknown.  . Codeine Other (See Comments)    "Makes me out of my head, loopy"  . Covera-Hs [Verapamil Hcl] Other (See Comments)    Unknown.  Marland Kitchen Hydrocodone Other (See Comments)    "makes me go out of my head, loopy"  . Other Other (See Comments)    Any type of narcotics Feels "loopy" Any type of narcotics Feels "loopy"  . Sulfa Antibiotics Itching  . Sulfamethoxazole Diarrhea  . Verapamil Diarrhea and Other (See Comments)  . Sulfasalazine Itching    No results found for this or any previous visit (from the past 2160 hour(s)).  Objective: General: Patient is awake, alert, and oriented x 3 and in no acute distress.  Integument:  Skin is warm, dry and supple bilateral. Nails are short and dystrophic with subungual debris, consistent with onychomycosis, 1-5 bilateral. +ulceration at left 1st toe that measures 1 x 1 cm with fatty tissue with now early bone exposed at medial hallux post debridement with no swelling, decreased warmth, minimal active bloody drainage, No odor.  No other acute signs of infection.       Right hallux wound is healed.   Vasculature:  Dorsalis Pedis pulse 1/4 bilateral. Posterior Tibial pulse  0/4 bilateral. Capillary fill time <3 sec 1-5 bilateral. No hair growth to the level of the digits.Temperature gradient within normal limits. + varicosities present bilateral. Trace edema present bilateral.   Neurology: Protective sensation slightly diminished bilateral.   Musculoskeletal:+ decreased pain to left 1st toe at ulceration.  + Bunion, hammertoes, pes planus asymptomatic pedal deformities noted bilateral. Muscular strength 4/5 in all lower extremity muscular groups bilateral without pain on range of motion.   Assessment and Plan: Problem List Items Addressed This Visit      Endocrine   Diabetic foot ulcer ([place]) - Primary   Relevant Orders   Ambulatory referral to Vascular Surgery    Other Visit  Diagnoses    PAD (peripheral artery disease) ([place])       Relevant Orders   Ambulatory referral to Vascular Surgery   Diabetic peripheral neuropathy associated with type 2 diabetes mellitus ([place])       Hallux valgus with bunions, left       Diabetes mellitus due to underlying condition with diabetic autonomic neuropathy, unspecified whether long term insulin use ([place])         -Examined patient -Excisionally dedbrided ulceration at left 1st toe to healthy bleeding borders removing nonviable tissue using a tissue nipper, Wound measures post debridement as above, Wound was debrided to the level of the fatty tissue with viable wound base exposed to promote healing. Hemostasis was achieved with manuel pressure. Patient tolerated procedure well without any discomfort or anesthesia necessary for this wound debridement.  -Applied steristrip and maxorb and dry sterile dressing to left 1st toe and instructed patient to continue with dressing changes every other day;orders provided to the patient to give to the staff -Continue with diabetic shoes that do not rub like before -ABI results reviewed and referral placed to VVS for further recommendations or possible intervention for wound healing -Advised daughter that we may consider referral to wound care center after she has seen vascular -Return after vascular appt for follow up wound care.  Landis Martins, DPM

## 2020-12-10 ENCOUNTER — Telehealth: Payer: Self-pay | Admitting: Sports Medicine

## 2020-12-10 ENCOUNTER — Other Ambulatory Visit: Payer: Medicare Other

## 2020-12-10 DIAGNOSIS — E113293 Type 2 diabetes mellitus with mild nonproliferative diabetic retinopathy without macular edema, bilateral: Secondary | ICD-10-CM | POA: Diagnosis not present

## 2020-12-10 DIAGNOSIS — E1140 Type 2 diabetes mellitus with diabetic neuropathy, unspecified: Secondary | ICD-10-CM | POA: Diagnosis not present

## 2020-12-10 DIAGNOSIS — I5032 Chronic diastolic (congestive) heart failure: Secondary | ICD-10-CM | POA: Diagnosis not present

## 2020-12-10 DIAGNOSIS — E11621 Type 2 diabetes mellitus with foot ulcer: Secondary | ICD-10-CM | POA: Diagnosis not present

## 2020-12-10 DIAGNOSIS — I110 Hypertensive heart disease with heart failure: Secondary | ICD-10-CM | POA: Diagnosis not present

## 2020-12-10 DIAGNOSIS — L97529 Non-pressure chronic ulcer of other part of left foot with unspecified severity: Secondary | ICD-10-CM | POA: Diagnosis not present

## 2020-12-10 NOTE — Telephone Encounter (Signed)
I already sent this on yesterday. She will get a call from them once they have reviewed her chart for scheduling -Dr. Cannon Kettle

## 2020-12-10 NOTE — Telephone Encounter (Signed)
Pt's daughter called requesting the referral for Vein & Vascular to be sent today. Please advise.

## 2020-12-13 ENCOUNTER — Telehealth: Payer: Self-pay | Admitting: *Deleted

## 2020-12-13 ENCOUNTER — Other Ambulatory Visit: Payer: Self-pay | Admitting: Sports Medicine

## 2020-12-13 DIAGNOSIS — E1140 Type 2 diabetes mellitus with diabetic neuropathy, unspecified: Secondary | ICD-10-CM | POA: Diagnosis not present

## 2020-12-13 DIAGNOSIS — I5032 Chronic diastolic (congestive) heart failure: Secondary | ICD-10-CM | POA: Diagnosis not present

## 2020-12-13 DIAGNOSIS — L97529 Non-pressure chronic ulcer of other part of left foot with unspecified severity: Secondary | ICD-10-CM | POA: Diagnosis not present

## 2020-12-13 DIAGNOSIS — E11621 Type 2 diabetes mellitus with foot ulcer: Secondary | ICD-10-CM | POA: Diagnosis not present

## 2020-12-13 DIAGNOSIS — I110 Hypertensive heart disease with heart failure: Secondary | ICD-10-CM | POA: Diagnosis not present

## 2020-12-13 DIAGNOSIS — E113293 Type 2 diabetes mellitus with mild nonproliferative diabetic retinopathy without macular edema, bilateral: Secondary | ICD-10-CM | POA: Diagnosis not present

## 2020-12-13 MED ORDER — DOXYCYCLINE HYCLATE 100 MG PO TABS: 100.0000 mg | ORAL_TABLET | Freq: Two times a day (BID) | ORAL | 0 refills | Status: DC

## 2020-12-13 NOTE — Progress Notes (Signed)
Sent Doxycyline for pain and swelling near wound and recommend nurse to add on betadine for the maceration to her current wound care plan of steristrip and maxorb to the area -Dr. Chauncey Cruel

## 2020-12-13 NOTE — Telephone Encounter (Signed)
Just in case if there is any early infection I sent Doxcycline antibiotics for patient to pick up from her pharmacy. Also have the home nurse use betadine at the area of maceration and continue with applying a steristrip and maxorb to the wound Thanks Dr. Chauncey Cruel

## 2020-12-13 NOTE — Telephone Encounter (Signed)
Linus Orn w/ [facility] said that patient's daughter called their office stating that the patient is in a lot of pain,wound looks worse.the nurse said that she has been with the patient for the last 2 days and the wound does not appear infected but does appear larger in size,little swelling in leg,maceration around  Wound.Please advise.

## 2020-12-14 ENCOUNTER — Encounter: Payer: Self-pay | Admitting: Sports Medicine

## 2020-12-14 ENCOUNTER — Other Ambulatory Visit: Payer: Self-pay | Admitting: *Deleted

## 2020-12-14 ENCOUNTER — Telehealth: Payer: Self-pay

## 2020-12-14 ENCOUNTER — Telehealth: Payer: Self-pay | Admitting: *Deleted

## 2020-12-14 DIAGNOSIS — I5032 Chronic diastolic (congestive) heart failure: Secondary | ICD-10-CM | POA: Diagnosis not present

## 2020-12-14 DIAGNOSIS — E1140 Type 2 diabetes mellitus with diabetic neuropathy, unspecified: Secondary | ICD-10-CM | POA: Diagnosis not present

## 2020-12-14 DIAGNOSIS — I110 Hypertensive heart disease with heart failure: Secondary | ICD-10-CM | POA: Diagnosis not present

## 2020-12-14 DIAGNOSIS — L97529 Non-pressure chronic ulcer of other part of left foot with unspecified severity: Secondary | ICD-10-CM | POA: Diagnosis not present

## 2020-12-14 DIAGNOSIS — E113293 Type 2 diabetes mellitus with mild nonproliferative diabetic retinopathy without macular edema, bilateral: Secondary | ICD-10-CM | POA: Diagnosis not present

## 2020-12-14 DIAGNOSIS — E11621 Type 2 diabetes mellitus with foot ulcer: Secondary | ICD-10-CM | POA: Diagnosis not present

## 2020-12-14 NOTE — Progress Notes (Signed)
Called patient to discuss concerns about worsening pain and circulation at area of ulceration. There was no answer on mobile or home number. Office staff have been notified to also try to reach patient in regards to her concern and request for a stat vascular consult if this issue is getting worse should go to the ER per my recommendation.

## 2020-12-14 NOTE — Telephone Encounter (Signed)
Wound care order faxed to [facility]), confirmation received-12/14/20.

## 2020-12-14 NOTE — Telephone Encounter (Signed)
Pt daughter would like her vein and vascular order changed to STAT. Pt Daughter [name] states that mom is in a lot of pain and wound is getting bigger. Please advise

## 2020-12-14 NOTE — Telephone Encounter (Signed)
Called Tracey(nurse with [facility]), left voicemessage per Dr Leeanne Rio order for the patient and that a prescription has been sent to her pharmacy.

## 2020-12-15 NOTE — Telephone Encounter (Signed)
Called [name] pts daughter this morning at 10:33 am. I explained that the order can be placed STAT and that if she continues to have pain and symptoms, it would be best to go to the ER.

## 2020-12-16 ENCOUNTER — Encounter (HOSPITAL_BASED_OUTPATIENT_CLINIC_OR_DEPARTMENT_OTHER): Payer: Medicare Other | Attending: Physician Assistant | Admitting: Internal Medicine

## 2020-12-16 ENCOUNTER — Other Ambulatory Visit: Payer: Self-pay

## 2020-12-16 DIAGNOSIS — I5032 Chronic diastolic (congestive) heart failure: Secondary | ICD-10-CM | POA: Insufficient documentation

## 2020-12-16 DIAGNOSIS — L97529 Non-pressure chronic ulcer of other part of left foot with unspecified severity: Secondary | ICD-10-CM | POA: Diagnosis not present

## 2020-12-16 DIAGNOSIS — E1122 Type 2 diabetes mellitus with diabetic chronic kidney disease: Secondary | ICD-10-CM | POA: Insufficient documentation

## 2020-12-16 DIAGNOSIS — N183 Chronic kidney disease, stage 3 unspecified: Secondary | ICD-10-CM | POA: Diagnosis not present

## 2020-12-16 DIAGNOSIS — I130 Hypertensive heart and chronic kidney disease with heart failure and stage 1 through stage 4 chronic kidney disease, or unspecified chronic kidney disease: Secondary | ICD-10-CM | POA: Diagnosis not present

## 2020-12-16 DIAGNOSIS — M86172 Other acute osteomyelitis, left ankle and foot: Secondary | ICD-10-CM | POA: Diagnosis not present

## 2020-12-16 DIAGNOSIS — E11621 Type 2 diabetes mellitus with foot ulcer: Secondary | ICD-10-CM | POA: Insufficient documentation

## 2020-12-16 DIAGNOSIS — E1151 Type 2 diabetes mellitus with diabetic peripheral angiopathy without gangrene: Secondary | ICD-10-CM | POA: Diagnosis not present

## 2020-12-16 NOTE — Progress Notes (Signed)
NATANYA, BICKNELL ([id number]) Visit Report for 12/16/2020 Chief Complaint Document Details Patient Name: Date of Service: [name], [name] 12/16/2020 12:30 PM Medical Record Number: [id number] Patient Account Number: [id number] Date of Birth/Sex: Treating RN: 18-Aug-1920 (87 y.o. Female) Deon Pilling Primary Care Provider: Lajean Manes T Other Clinician: Referring Provider: Treating Provider/Extender: [name] in Treatment: 0 Information Obtained from: Patient Chief Complaint Left great toe wound Electronic Signature(s) Signed: 12/16/2020 3:45:45 PM By: Kalman Shan DO Entered By: Kalman Shan on 12/16/2020 15:10:18 -------------------------------------------------------------------------------- HPI Details Patient Name: Date of Service: [name], [name] 12/16/2020 12:30 PM Medical Record Number: [id number] Patient Account Number: [id number] Date of Birth/Sex: Treating RN: 1920/08/22 (87 y.o. Female) Deon Pilling Primary Care Provider: Lajean Manes T Other Clinician: Referring Provider: Treating Provider/Extender: [name] in Treatment: 0 History of Present Illness HPI Description: Admission 5/5 Ms. Brashier is a 87 year old female with a past medical history of type 2 diabetes on oral agents, essential hypertension, chronic diastolic congestive heart failure and CKD 3 that presents to our office for a diabetic foot wound located to the left first great toe. Patient is accompanied by daughter and caregiver who helps provide the history. The wound started 1 year ago and has not shown signs of improvement. In fact they state that for the past month it has gotten worse. She has been following with podiatry for this issue. She does not report having an image done of this foot. She states that recently she was placed on doxycycline because a home health nurse was concerned about an infection. She is using collagen  every other day to the wound site. She currently denies any fever/chills, purulent drainage, increased warmth or erythema to the foot. Electronic Signature(s) Signed: 12/16/2020 3:45:45 PM By: Kalman Shan DO Entered By: Kalman Shan on 12/16/2020 15:32:51 -------------------------------------------------------------------------------- Physical Exam Details Patient Name: Date of Service: [name], [name] 12/16/2020 12:30 PM Medical Record Number: [id number] Patient Account Number: [id number] Date of Birth/Sex: Treating RN: 1920-09-19 (87 y.o. Female) Deon Pilling Primary Care Provider: Other Clinician: Patrina Levering Referring Provider: Treating Provider/Extender: [name] in Treatment: 0 Constitutional respirations regular, non-labored and within target range for patient.. Cardiovascular 2+ dorsalis pedis/posterior tibialis pulses. Psychiatric pleasant and cooperative. Notes Left great toe: Open wound with probing to the bone. There is granulation tissue present. No acute signs of infection including purulent drainage or increased warmth or erythema to the foot. Electronic Signature(s) Signed: 12/16/2020 3:45:45 PM By: Kalman Shan DO Entered By: Kalman Shan on 12/16/2020 15:33:05 -------------------------------------------------------------------------------- Physician Orders Details Patient Name: Date of Service: [name], [name] 12/16/2020 12:30 PM Medical Record Number: [id number] Patient Account Number: [id number] Date of Birth/Sex: Treating RN: 05/19/1921 (87 y.o. Female) Lorrin Jackson Primary Care Provider: Lajean Manes T Other Clinician: Referring Provider: Treating Provider/Extender: [name] in Treatment: 0 Verbal / Phone Orders: No Diagnosis Coding ICD-10 Coding Code Description L97.529 Non-pressure chronic ulcer of other part of left foot with unspecified severity E11.621 Type  2 diabetes mellitus with foot ulcer I10 Essential (primary) hypertension 99991111 Chronic diastolic (congestive) heart failure Follow-up Appointments ppointment in 1 week. - with Dr. Heber Gerald Return A Bathing/ Shower/ Hygiene May shower and wash wound with soap and water. Edema Control - Lymphedema / SCD / Other Elevate legs to the level of the heart or above for 30 minutes daily and/or when sitting,  a frequency of: Off-Loading Other: - Remove shoes when sitting, only put shoes on when up walking. Additional Orders / Instructions Follow Nutritious Diet - 100-120g of Protein Home Health New wound care orders this week; continue Home Health for wound care. May utilize formulary equivalent dressing for wound treatment orders unless otherwise specified. - Home Health to assess wound and change dressing 2x week. Wound Treatment Wound #1 - T Great oe Wound Laterality: Left Cleanser: Normal Saline [facility]) Every Other Day/15 Days Discharge Instructions: Cleanse the wound with Normal Saline prior to applying a clean dressing using gauze sponges, not tissue or cotton balls. Cleanser: Soap and Water [facility]) Every Other Day/15 Days Discharge Instructions: May shower and wash wound with dial antibacterial soap and water prior to dressing change. Prim Dressing: FIBRACOL Plus Dressing, 2x2 in (collagen) (Home Health) Every Other Day/15 Days ary Discharge Instructions: Moisten collagen with saline or hydrogel Secondary Dressing: Woven Gauze Sponge, Non-Sterile 4x4 in [facility]) Every Other Day/15 Days Discharge Instructions: Apply over primary dressing as directed. Secondary Dressing: Felt 2.5 yds x 5.5 in [place]) Every Other Day/15 Days Discharge Instructions: Apply as donut over primary dressing Secured With: Conforming Stretch Gauze Bandage, Sterile 2x75 (in/in) (Home Health) Every Other Day/15 Days Discharge Instructions: Secure with stretch gauze as directed. Secured With: 43M  Medipore H Soft Cloth Surgical T 4 x 2 (in/yd) (Home Health) Every Other Day/15 Days ape Discharge Instructions: Secure dressing with tape as directed. Radiology Magnetic Resonance Imaging (MRI) with and without Contrast - Left Foot CPT - (ICD10 L97.529 - Non-pressure chronic ulcer of other part of left : foot with unspecified severity) Electronic Signature(s) Signed: 12/16/2020 3:45:45 PM By: Kalman Shan DO Entered By: Kalman Shan on 12/16/2020 15:43:23 Prescription 12/16/2020 -------------------------------------------------------------------------------- [name]. Kalman Shan DO Patient Name: Provider: 04-08-1921 [id number] Date of Birth: NPI#: Female [id number] Sex: DEA #: [phone] Phone #: License #: [place] Patient Address: [place], [place] [address] [place], [place] [address] [phone] Allergies Augmentin; Catapres; codeine; hydrocodone; Sulfa (Sulfonamide Antibiotics); verapamil; sulfasalazine Provider's Orders Magnetic Resonance Imaging (MRI) with and without Contrast - ICD10: L97.529 - Left Foot CPT: Hand Signature: Date(s): Electronic Signature(s) Signed: 12/16/2020 3:45:45 PM By: Kalman Shan DO Entered By: Kalman Shan on 12/16/2020 15:44:04 -------------------------------------------------------------------------------- Problem List Details Patient Name: Date of Service: [name], [name] 12/16/2020 12:30 PM Medical Record Number: [id number] Patient Account Number: [id number] Date of Birth/Sex: Treating RN: 1921/03/27 (87 y.o. Female) Deon Pilling Primary Care Provider: Other Clinician: Patrina Levering Referring Provider: Treating Provider/Extender: [name] in Treatment: 0 Active Problems ICD-10 Encounter Code Description Active Date MDM Diagnosis L97.529 Non-pressure chronic ulcer of other  part of left foot with unspecified severity 12/16/2020 No Yes E11.621 Type 2 diabetes mellitus with foot ulcer 12/16/2020 No Yes I10 Essential (primary) hypertension 12/16/2020 No Yes [id number] Chronic diastolic (congestive) heart failure 12/16/2020 No Yes Inactive Problems Resolved Problems Electronic Signature(s) Signed: 12/16/2020 3:45:45 PM By: Kalman Shan DO Entered By: Kalman Shan on 12/16/2020 15:42:54 -------------------------------------------------------------------------------- Progress Note Details Patient Name: Date of Service: [name], [name] 12/16/2020 12:30 PM Medical Record Number: [id number] Patient Account Number: [id number] Date of Birth/Sex: Treating RN: 1921/05/10 (87 y.o. Female) Deon Pilling Primary Care Provider: Patrina Levering Other Clinician: Referring Provider: Treating Provider/Extender: [name] Weeks in Treatment: 0 Subjective Chief Complaint Information obtained from  Patient Left great toe wound History of Present Illness (HPI) Admission 5/5 Ms. Tuccillo is a 87 year old female with a past medical history of type 2 diabetes on oral agents, essential hypertension, chronic diastolic congestive heart failure and CKD 3 that presents to our office for a diabetic foot wound located to the left first great toe. Patient is accompanied by daughter and caregiver who helps provide the history. The wound started 1 year ago and has not shown signs of improvement. In fact they state that for the past month it has gotten worse. She has been following with podiatry for this issue. She does not report having an image done of this foot. She states that recently she was placed on doxycycline because a home health nurse was concerned about an infection. She is using collagen every other day to the wound site. She currently denies any fever/chills, purulent drainage, increased warmth or erythema to the foot. Patient History Information obtained  from Patient. Allergies Augmentin (Reaction: diarrhea), Catapres, codeine, hydrocodone, Sulfa (Sulfonamide Antibiotics), verapamil, sulfasalazine Family History Cancer - Siblings, Diabetes - Mother,Maternal Grandparents, Heart Disease - Mother, Hypertension - Mother, Stroke - Siblings, No family history of Hereditary Spherocytosis, Kidney Disease, Lung Disease, Seizures, Thyroid Problems, Tuberculosis. Social History Never smoker, Marital Status - Widowed, Alcohol Use - Never, Drug Use - No History, Caffeine Use - Daily. Medical History Cardiovascular Patient has history of Congestive Heart Failure, Hypertension, Peripheral Arterial Disease, Phlebitis Endocrine Patient has history of Type II Diabetes Musculoskeletal Patient has history of Osteoarthritis Patient is treated with Oral Agents. Blood sugar is tested. Medical A Surgical History Notes nd Eyes Degenerative Eye Disease Respiratory Uses inhaler Gastrointestinal Hiatal Hernia Genitourinary CKD stage 3-Incontinence Review of Systems (ROS) Eyes Complains or has symptoms of Glasses / Contacts. Ear/Nose/Mouth/Throat Denies complaints or symptoms of Chronic sinus problems or rhinitis. Respiratory Complains or has symptoms of Shortness of Breath. Neurologic Denies complaints or symptoms of Numbness/parasthesias. Psychiatric Denies complaints or symptoms of Claustrophobia, Suicidal. Objective Constitutional respirations regular, non-labored and within target range for patient.. Vitals Time Taken: 1:44 PM, Temperature: 97.8 F, Pulse: 89 bpm, Respiratory Rate: 18 breaths/min, Blood Pressure: 147/81 mmHg, Capillary Blood Glucose: 116 mg/dl. General Notes: Glucose per patient report Cardiovascular 2+ dorsalis pedis/posterior tibialis pulses. Psychiatric pleasant and cooperative. General Notes: Left great toe: Open wound with probing to the bone. There is granulation tissue present. No acute signs of infection including  purulent drainage or increased warmth or erythema to the foot. Integumentary (Hair, Skin) Wound #1 status is Open. Original cause of wound was Other Lesion. The date acquired was: 11/24/2019. The wound is located on the Left T Great. The wound oe measures 1.2cm length x 1.3cm width x 1.9cm depth; 1.225cm^2 area and 2.328cm^3 volume. There is bone and Fat Layer (Subcutaneous Tissue) exposed. There is undermining starting at 9:00 and ending at 11:00 with a maximum distance of 0.5cm. There is a medium amount of serosanguineous drainage noted. The wound margin is well defined and not attached to the wound base. There is large (67-100%) red, friable granulation within the wound bed. There is no necrotic tissue within the wound bed. General Notes: calloused periwound Assessment Active Problems ICD-10 Non-pressure chronic ulcer of other part of left foot with unspecified severity Type 2 diabetes mellitus with foot ulcer Essential (primary) hypertension Chronic diastolic (congestive) heart failure Patient presents with a 1 year nonhealing wound to her left great toe that probes to bone. I am concerned about osteomyelitis. I will order an MRI. I  recommended collagen every other day to the wound currently. She did have ABIs ordered by her primary care physician. That showed a TBI of 0.87 on the right and 0.62 on the left. Patient states she was referred to vascular and are awaiting scheduling. I do not see any acute signs of infection at this time. She is currently taking doxycycline prescribed by her podiatrist. I will see her at follow-up in 1 week. Plan Follow-up Appointments: Return Appointment in 1 week. - with Dr. Heber [place] Bathing/ Shower/ Hygiene: May shower and wash wound with soap and water. Edema Control - Lymphedema / SCD / Other: Elevate legs to the level of the heart or above for 30 minutes daily and/or when sitting, a frequency of: Off-Loading: Other: - Remove shoes when sitting, only  put shoes on when up walking. Additional Orders / Instructions: Follow Nutritious Diet - 100-120g of Protein Home Health: New wound care orders this week; continue Home Health for wound care. May utilize formulary equivalent dressing for wound treatment orders unless otherwise specified. - Home Health to assess wound and change dressing 2x week. Radiology ordered were: Magnetic Resonance Imaging (MRI) with and without Contrast - Left Foot CPT : WOUND #1: - T Great Wound Laterality: Left oe Cleanser: Normal Saline (Home Health) Every Other Day/15 Days Discharge Instructions: Cleanse the wound with Normal Saline prior to applying a clean dressing using gauze sponges, not tissue or cotton balls. Cleanser: Soap and Water [facility]) Every Other Day/15 Days Discharge Instructions: May shower and wash wound with dial antibacterial soap and water prior to dressing change. Prim Dressing: FIBRACOL Plus Dressing, 2x2 in (collagen) (Home Health) Every Other Day/15 Days ary Discharge Instructions: Moisten collagen with saline or hydrogel Secondary Dressing: Woven Gauze Sponge, Non-Sterile 4x4 in [facility]) Every Other Day/15 Days Discharge Instructions: Apply over primary dressing as directed. Secondary Dressing: Felt 2.5 yds x 5.5 in [facility]) Every Other Day/15 Days Discharge Instructions: Apply as donut over primary dressing Secured With: Conforming Stretch Gauze Bandage, Sterile 2x75 (in/in) (Home Health) Every Other Day/15 Days Discharge Instructions: Secure with stretch gauze as directed. Secured With: 11M Medipore H Soft Cloth Surgical T 4 x 2 (in/yd) (Home Health) Every Other Day/15 Days ape Discharge Instructions: Secure dressing with tape as directed. 1. MRI of the left foot 2. Collagen every other day 3. Follow-up in 1 week Electronic Signature(s) Signed: 12/16/2020 3:45:45 PM By: Kalman Shan DO Entered By: Kalman Shan on 12/16/2020  15:43:36 -------------------------------------------------------------------------------- HxROS Details Patient Name: Date of Service: [name], [name] 12/16/2020 12:30 PM Medical Record Number: [id number] Patient Account Number: [id number] Date of Birth/Sex: Treating RN: November 26, 1920 (87 y.o. Female) Lorrin Jackson Primary Care Provider: Lajean Manes T Other Clinician: Referring Provider: Treating Provider/Extender: [name] in Treatment: 0 Information Obtained From Patient Eyes Complaints and Symptoms: Positive for: Glasses / Contacts Medical History: Past Medical History Notes: Degenerative Eye Disease Ear/Nose/Mouth/Throat Complaints and Symptoms: Negative for: Chronic sinus problems or rhinitis Respiratory Complaints and Symptoms: Positive for: Shortness of Breath Medical History: Past Medical History Notes: Uses inhaler Neurologic Complaints and Symptoms: Negative for: Numbness/parasthesias Psychiatric Complaints and Symptoms: Negative for: Claustrophobia; Suicidal Hematologic/Lymphatic Cardiovascular Medical History: Positive for: Congestive Heart Failure; Hypertension; Peripheral Arterial Disease; Phlebitis Gastrointestinal Medical History: Past Medical History Notes: Hiatal Hernia Endocrine Medical History: Positive for: Type II Diabetes Time with diabetes: 30+ years Treated with: Oral agents Blood sugar tested every day: Yes Tested : QOD Genitourinary Medical History: Past Medical  History Notes: CKD stage 3-Incontinence Musculoskeletal Medical History: Positive for: Osteoarthritis Oncologic Immunizations Pneumococcal Vaccine: Received Pneumococcal Vaccination: Yes Implantable Devices None Family and Social History Cancer: Yes - Siblings; Diabetes: Yes - Mother,Maternal Grandparents; Heart Disease: Yes - Mother; Hereditary Spherocytosis: No; Hypertension: Yes - Mother; Kidney Disease: No; Lung Disease: No;  Seizures: No; Stroke: Yes - Siblings; Thyroid Problems: No; Tuberculosis: No; Never smoker; Marital Status - Widowed; Alcohol Use: Never; Drug Use: No History; Caffeine Use: Daily; Financial Concerns: No; Food, Clothing or Shelter Needs: No; Support System Lacking: No; Transportation Concerns: No Electronic Signature(s) Signed: 12/16/2020 3:45:45 PM By: Kalman Shan DO Signed: 12/16/2020 5:52:28 PM By: Lorrin Jackson Entered By: Lorrin Jackson on 12/16/2020 14:06:50 -------------------------------------------------------------------------------- SuperBill Details Patient Name: Date of Service: [name], [name] 12/16/2020 Medical Record Number: [id number] Patient Account Number: [id number] Date of Birth/Sex: Treating RN: 1920/08/26 (87 y.o. Female) Lorrin Jackson Primary Care Provider: Lajean Manes T Other Clinician: Referring Provider: Treating Provider/Extender: [name] in Treatment: 0 Diagnosis Coding ICD-10 Codes Code Description [phone] Non-pressure chronic ulcer of other part of left foot with unspecified severity E11.621 Type 2 diabetes mellitus with foot ulcer N18.30 Chronic kidney disease, stage 3 unspecified I10 Essential (primary) hypertension Y18.56 Chronic diastolic (congestive) heart failure Facility Procedures CPT4 Code: [id number] Description: 99214 - WOUND CARE VISIT-LEV 4 EST PT Modifier: Quantity: 1 Physician Procedures : CPT4 Code Description Modifier [id number] - WC PHYS LEVEL 4 - NEW PT ICD-10 Diagnosis Description L97.529 Non-pressure chronic ulcer of other part of left foot with unspecified severity E11.621 Type 2 diabetes mellitus with foot ulcer Quantity: 1 Electronic Signature(s) Signed: 12/16/2020 3:45:45 PM By: Kalman Shan DO Entered By: Kalman Shan on 12/16/2020 15:43:53

## 2020-12-16 NOTE — Progress Notes (Signed)
Heidi Dominguez, Heidi Dominguez ([id number]) Visit Report for 12/16/2020 Abuse/Suicide Risk Screen Details Patient Name: Date of Service: Heidi Dominguez, [name] 12/16/2020 12:30 PM Medical Record Number: [id number] Patient Account Number: [id number] Date of Birth/Sex: Treating RN: 12-08-1920 (87 y.o. Female) Heidi Dominguez Primary Care Donyae Kohn: Merlene Laughter T Other Clinician: Referring Nikia Levels: Treating Jacqualyn Sedgwick/Extender: Heide Scales T Weeks in Treatment: 0 Abuse/Suicide Risk Screen Items Answer ABUSE RISK SCREEN: Has anyone close to you tried to hurt or harm you recentlyo No Do you feel uncomfortable with anyone in your familyo No Has anyone forced you do things that you didnt want to doo No Electronic Signature(s) Signed: 12/16/2020 5:52:28 PM By: Heidi Dominguez Entered By: Heidi Dominguez on 12/16/2020 13:56:45 -------------------------------------------------------------------------------- Activities of Daily Living Details Patient Name: Date of Service: Heidi Dominguez, [name] 12/16/2020 12:30 PM Medical Record Number: [id number] Patient Account Number: [id number] Date of Birth/Sex: Treating RN: [birth year] (87 y.o. Female) Heidi Dominguez Primary Care Seiji Wiswell: Merlene Laughter T Other Clinician: Referring Maki Sweetser: Treating Rhet Rorke/Extender: Heide Scales T Weeks in Treatment: 0 Activities of Daily Living Items Answer Activities of Daily Living (Please select one for each item) Drive Automobile Not Able T Medications ake Need Assistance Use T elephone Need Assistance Care for Appearance Need Assistance Use T oilet Need Assistance Bath / Shower Need Assistance Dress Self Need Assistance Feed Self Need Assistance Walk Need Assistance Get In / Out Bed Need Assistance Housework Need Assistance Prepare Meals Need Assistance Handle Money Not Able Shop for Self Not Able Electronic Signature(s) Signed: 12/16/2020 5:52:28 PM By: Heidi Dominguez Entered By:  Heidi Dominguez on 12/16/2020 13:57:19 -------------------------------------------------------------------------------- Education Screening Details Patient Name: Date of Service: Heidi Dominguez, [name] 12/16/2020 12:30 PM Medical Record Number: [id number] Patient Account Number: [id number] Date of Birth/Sex: Treating RN: [birth year] (87 y.o. Female) Heidi Dominguez Primary Care Jaelyne Deeg: Merlene Laughter T Other Clinician: Referring Charlet Harr: Treating Duffy Dantonio/Extender: Sharren Bridge in Treatment: 0 Primary Learner Assessed: Patient Learning Preferences/Education Level/Primary Language Learning Preference: Explanation, Demonstration, Printed Material Highest Education Level: College or Above Preferred Language: English Cognitive Barrier Language Barrier: No Translator Needed: No Memory Deficit: No Emotional Barrier: No Cultural/Religious Beliefs Affecting Medical Care: No Physical Barrier Impaired Vision: Yes Glasses Impaired Hearing: Yes Decreased Hand dexterity: No Knowledge/Comprehension Knowledge Level: Medium Comprehension Level: Medium Ability to understand written instructions: High Ability to understand verbal instructions: High Motivation Anxiety Level: Calm Cooperation: Cooperative Education Importance: Acknowledges Need Interest in Health Problems: Asks Questions Perception: Coherent Willingness to Engage in Self-Management High Activities: Readiness to Engage in Self-Management High Activities: Electronic Signature(s) Signed: 12/16/2020 5:52:28 PM By: Heidi Dominguez Entered By: Heidi Dominguez on 12/16/2020 13:58:00 -------------------------------------------------------------------------------- Fall Risk Assessment Details Patient Name: Date of Service: Heidi Dominguez, [name] 12/16/2020 12:30 PM Medical Record Number: [id number] Patient Account Number: [id number] Date of Birth/Sex: Treating RN: 1920/09/07 (87 y.o. Female) Heidi Dominguez Primary Care Kiarra Kidd: Merlene Laughter T Other Clinician: Referring Khori Underberg: Treating Sung Renton/Extender: Heide Scales T Weeks in Treatment: 0 Fall Risk Assessment Items Have you had 2 or more falls in the last 12 monthso 0 No Have you had any fall that resulted in injury in the last 12 monthso 0 No FALLS RISK SCREEN History of falling - immediate or within 3 months 0 No Secondary diagnosis (Do you have 2 or more medical diagnoseso) 0 No Ambulatory aid None/bed rest/wheelchair/nurse 0 No Crutches/cane/walker 15 Yes Furniture 0 No Intravenous therapy Access/Saline/Heparin Lock 0  No Gait/Transferring Normal/ bed rest/ wheelchair 0 No Weak (short steps with or without shuffle, stooped but able to lift head while walking, may seek 10 Yes support from furniture) Impaired (short steps with shuffle, may have difficulty arising from chair, head down, impaired 0 No balance) Mental Status Oriented to own ability 0 Yes Electronic Signature(s) Signed: 12/16/2020 5:52:28 PM By: Heidi Dominguez Entered By: Heidi Dominguez on 12/16/2020 13:58:27 -------------------------------------------------------------------------------- Foot Assessment Details Patient Name: Date of Service: Heidi Doming[place], [name] 12/16/2020 12:30 PM Medical Record Number: [id number] Patient Account Number: [id number] Date of Birth/Sex: Treating RN: 22-Sep-1920 (87 y.o. Female) Heidi Dominguez Primary Care Kynzli Rease: Merlene Laughter T Other Clinician: Referring Tammala Weider: Treating Kevis Qu/Extender: Heide Scales T Weeks in Treatment: 0 Foot Assessment Items Site Locations + = Sensation present, - = Sensation absent, C = Callus, U = Ulcer R = Redness, W = Warmth, M = Maceration, PU = Pre-ulcerative lesion F = Fissure, S = Swelling, D = Dryness Assessment Right: Left: Other Deformity: No No Prior Foot Ulcer: No No Prior Amputation: No No Charcot Joint: No No Ambulatory Status:  Ambulatory With Help Assistance Device: Wheelchair Gait: Steady Electronic Signature(s) Signed: 12/16/2020 5:52:28 PM By: Heidi Dominguez Entered By: Heidi Dominguez on 12/16/2020 14:04:04 -------------------------------------------------------------------------------- Nutrition Risk Screening Details Patient Name: Date of Service: Heidi Dominguez, [name] 12/16/2020 12:30 PM Medical Record Number: [id number] Patient Account Number: [id number] Date of Birth/Sex: Treating RN: Jan 07, 1921 (87 y.o. Female) Heidi Dominguez Primary Care Taiya Nutting: Merlene Laughter T Other Clinician: Referring Allani Reber: Treating Latoyia Tecson/Extender: Heide Scales T Weeks in Treatment: 0 Height (in): Weight (lbs): Body Mass Index (BMI): Nutrition Risk Screening Items Score Screening NUTRITION RISK SCREEN: I have an illness or condition that made me change the kind and/or amount of food I eat 0 No I eat fewer than two meals per day 0 No I eat few fruits and vegetables, or milk products 0 No I have three or more drinks of beer, liquor or wine almost every day 0 No I have tooth or mouth problems that make it hard for me to eat 0 No I don't always have enough money to buy the food I need 0 No I eat alone most of the time 0 No I take three or more different prescribed or over-the-counter drugs a day 1 Yes Without wanting to, I have lost or gained 10 pounds in the last six months 0 No I am not always physically able to shop, cook and/or feed myself 0 No Nutrition Protocols Good Risk Protocol 0 No interventions needed Moderate Risk Protocol High Risk Proctocol Risk Level: Good Risk Score: 1 Electronic Signature(s) Signed: 12/16/2020 5:52:28 PM By: Heidi Dominguez Entered By: Heidi Dominguez on 12/16/2020 13:58:40

## 2020-12-16 NOTE — Progress Notes (Signed)
1:2.328] [N/A:N/A] Volume (cm) : [1:0.00%] [N/A:N/A] % Reduction in A rea: [1:0.00%] [N/A:N/A] % Reduction in Volume: [1:9] Starting Position 1 (o'clock): [1:11] Ending Position 1 (o'clock): [1:0.5] Maximum Distance 1 (cm): [1:Yes] [N/A:N/A] Undermining: [1:Grade 2] [N/A:N/A] Classification: [1:Medium] [N/A:N/A] Exudate A mount: [1:Serosanguineous] [N/A:N/A] Exudate Type: [1:red, brown] [N/A:N/A] Exudate Color: [1:Well defined, not attached] [N/A:N/A] Wound Margin: [1:Large (67-100%)] [N/A:N/A] Granulation A mount: [1:Red, Friable] [N/A:N/A] Granulation Quality: [1:None Present (0%)] [N/A:N/A] Necrotic A mount: [1:Fat Layer (Subcutaneous Tissue): Yes N/A] Exposed Structures: [1:Bone: Yes Fascia: No Tendon: No Muscle: No Joint: No calloused periwound] [N/A:N/A] Treatment Notes Electronic Signature(s) Signed: 12/16/2020 3:45:45 PM By: Kalman Shan DO Signed: 12/16/2020 6:21:36 PM By: Deon Pilling Entered By: Kalman Shan on 12/16/2020  15:09:46 -------------------------------------------------------------------------------- Multi-Disciplinary Care Plan Details Patient Name: Date of Service: [name], [name] 12/16/2020 12:30 PM Medical Record Number: [id number] Patient Account Number: [id number] Date of Birth/Sex: Treating RN: September 27, 1920 (87 y.o. Female) Lorrin Jackson Primary Care Seneca Gadbois: Lajean Manes T Other Clinician: Referring Kaylum Shrum: Treating Lilas Diefendorf/Extender: [name] in Treatment: 0 Active Inactive Orientation to the Wound Care Program Nursing Diagnoses: Knowledge deficit related to the wound healing center program Goals: Patient/caregiver will verbalize understanding of the [facility] Date Initiated: 12/16/2020 Target Resolution Date: 01/13/2021 Goal Status: Active Interventions: Provide education on orientation to the wound center Notes: Wound/Skin Impairment Nursing Diagnoses: Impaired tissue integrity Goals: Patient/caregiver will verbalize understanding of skin care regimen Date Initiated: 12/16/2020 Target Resolution Date: 01/13/2021 Goal Status: Active Ulcer/skin breakdown will have a volume reduction of 30% by week 4 Date Initiated: 12/16/2020 Target Resolution Date: 01/13/2021 Goal Status: Active Interventions: Assess patient/caregiver ability to obtain necessary supplies Assess patient/caregiver ability to perform ulcer/skin care regimen upon admission and as needed Assess ulceration(s) every visit Provide education on ulcer and skin care Treatment Activities: Skin care regimen initiated : 12/16/2020 Topical wound management initiated : 12/16/2020 Notes: Electronic Signature(s) Signed: 12/16/2020 5:52:28 PM By: Lorrin Jackson Entered By: Lorrin Jackson on 12/16/2020 14:35:24 -------------------------------------------------------------------------------- Pain Assessment Details Patient Name: Date of Service: [name], [name] 12/16/2020  12:30 PM Medical Record Number: [id number] Patient Account Number: [id number] Date of Birth/Sex: Treating RN: 14-Nov-1920 (87 y.o. Female) Lorrin Jackson Primary Care Dsean Vantol: Lajean Manes T Other Clinician: Referring Rhemi Balbach: Treating Sarann Tregre/Extender: [name] in Treatment: 0 Active Problems Location of Pain Severity and Description of Pain Patient Has Paino Yes Site Locations Pain Location: Pain in Ulcers With Dressing Change: Yes Duration of the Pain. Constant / Intermittento Intermittent Rate the pain. Current Pain Level: 3 Character of Pain Describe the Pain: Burning, Tender Pain Management and Medication Current Pain Management: Medication: Yes Cold Application: No Rest: Yes Massage: No Activity: No T.E.N.S.: No Heat Application: No Leg drop or elevation: No Is the Current Pain Management Adequate: Inadequate How does your wound impact your activities of daily livingo Sleep: Yes Bathing: No Appetite: No Relationship With Others: No Bladder Continence: No Emotions: No Bowel Continence: No Work: No Toileting: No Drive: No Dressing: No Hobbies: No Electronic Signature(s) Signed: 12/16/2020 5:52:28 PM By: Lorrin Jackson Entered By: Lorrin Jackson on 12/16/2020 14:18:53 -------------------------------------------------------------------------------- Patient/Caregiver Education Details Patient Name: Date of Service: [name], [name] 5/5/2022andnbsp12:30 PM Medical Record Number: [id number] Patient Account Number: [id number] Date of Birth/Gender: Treating RN: 1921/04/19 (87 y.o. Female) Lorrin Jackson Primary Care Physician: Lajean Manes T Other Clinician: Referring Physician: Treating Physician/Extender: [name] in Treatment: 0  ZAIDA, REILAND ([id number]) Visit Report for 12/16/2020 Allergy List Details Patient Name: Date of Service: [name], [name] 12/16/2020 12:30 PM Medical Record Number: [id number] Patient Account Number: [id number] Date of Birth/Sex: Treating RN: [birth year] (87 y.o. Female) Lorrin Jackson Primary Care Leanora Murin: Lajean Manes T Other Clinician: Referring Leanndra Pember: Treating Porche Steinberger/Extender: [name] in Treatment: 0 Allergies Active Allergies Augmentin Reaction: diarrhea Catapres codeine hydrocodone Sulfa (Sulfonamide Antibiotics) verapamil sulfasalazine Allergy Notes Electronic Signature(s) Signed: 12/16/2020 5:52:28 PM By: Lorrin Jackson Entered By: Lorrin Jackson on 12/16/2020 13:48:28 -------------------------------------------------------------------------------- Arrival Information Details Patient Name: Date of Service: [name], [name] 12/16/2020 12:30 PM Medical Record Number: [id number] Patient Account Number: [id number] Date of Birth/Sex: Treating RN: June 01, 1921 (87 y.o. Female) Lorrin Jackson Primary Care Ervan Heber: Lajean Manes T Other Clinician: Referring Wyat Infinger: Treating Finleigh Cheong/Extender: [name] Weeks in Treatment: 0 Visit Information Patient Arrived: Wheel Chair Arrival Time: 13:40 Accompanied By: Daughter/POA Transfer Assistance: Manual Patient Identification Verified: Yes Secondary Verification Process Completed: Yes Patient Requires Transmission-Based No Precautions: Patient Has Alerts: Yes Patient Alerts: Patient on Blood Thinner ABI RandL=Non Compressible TBI R=0.87 L=0.62 Electronic Signature(s) Signed: 12/16/2020 5:52:28 PM By: Lorrin Jackson Entered By: Lorrin Jackson on 12/16/2020 14:07:37 -------------------------------------------------------------------------------- Clinic Level of Care Assessment Details Patient Name: Date of Service: [name], [name] 12/16/2020 12:30  PM Medical Record Number: [id number] Patient Account Number: [id number] Date of Birth/Sex: Treating RN: 1921/07/26 (87 y.o. Female) Lorrin Jackson Primary Care Jenifer Struve: Lajean Manes T Other Clinician: Referring Cherrie Franca: Treating Junnie Loschiavo/Extender: [name] in Treatment: 0 Clinic Level of Care Assessment Items TOOL 2 Quantity Score X- 1 0 Use when only an EandM is performed on the INITIAL visit ASSESSMENTS - Nursing Assessment / Reassessment X- 1 20 General Physical Exam (combine w/ comprehensive assessment (listed just below) when performed on new pt. evals) X- 1 25 Comprehensive Assessment (HX, ROS, Risk Assessments, Wounds Hx, etc.) ASSESSMENTS - Wound and Skin A ssessment / Reassessment X - Simple Wound Assessment / Reassessment - one wound 1 5 []  - 0 Complex Wound Assessment / Reassessment - multiple wounds []  - 0 Dermatologic / Skin Assessment (not related to wound area) ASSESSMENTS - Ostomy and/or Continence Assessment and Care []  - 0 Incontinence Assessment and Management []  - 0 Ostomy Care Assessment and Management (repouching, etc.) PROCESS - Coordination of Care []  - 0 Simple Patient / Family Education for ongoing care X- 1 20 Complex (extensive) Patient / Family Education for ongoing care X- 1 10 Staff obtains Programmer, systems, Records, T Results / Process Orders est X- 1 10 Staff telephones HHA, Nursing Homes / Clarify orders / etc []  - 0 Routine Transfer to another Facility (non-emergent condition) []  - 0 Routine Hospital Admission (non-emergent condition) []  - 0 New Admissions / Biomedical engineer / Ordering NPWT Apligraf, etc. , []  - 0 Emergency Hospital Admission (emergent condition) []  - 0 Simple Discharge Coordination []  - 0 Complex (extensive) Discharge Coordination PROCESS - Special Needs []  - 0 Pediatric / Minor Patient Management []  - 0 Isolation Patient Management []  - 0 Hearing / Language / Visual special  needs []  - 0 Assessment of Community assistance (transportation, D/C planning, etc.) []  - 0 Additional assistance / Altered mentation X- 1 15 Support Surface(s) Assessment (bed, cushion, seat, etc.) INTERVENTIONS - Wound Cleansing / Measurement X- 1 5 Wound Imaging (photographs - any number of wounds) []  - 0 Wound Tracing (instead of photographs) X- 1  5 Simple Wound Measurement - one wound []  - 0 Complex Wound Measurement - multiple wounds X- 1 5 Simple Wound Cleansing - one wound []  - 0 Complex Wound Cleansing - multiple wounds INTERVENTIONS - Wound Dressings X - Small Wound Dressing one or multiple wounds 1 10 []  - 0 Medium Wound Dressing one or multiple wounds []  - 0 Large Wound Dressing one or multiple wounds []  - 0 Application of Medications - injection INTERVENTIONS - Miscellaneous []  - 0 External ear exam []  - 0 Specimen Collection (cultures, biopsies, blood, body fluids, etc.) []  - 0 Specimen(s) / Culture(s) sent or taken to Lab for analysis []  - 0 Patient Transfer (multiple staff / Civil Service fast streamer / Similar devices) []  - 0 Simple Staple / Suture removal (25 or less) []  - 0 Complex Staple / Suture removal (26 or more) []  - 0 Hypo / Hyperglycemic Management (close monitor of Blood Glucose) []  - 0 Ankle / Brachial Index (ABI) - do not check if billed separately Has the patient been seen at the hospital within the last three years: Yes Total Score: 130 Level Of Care: New/Established - Level 4 Electronic Signature(s) Signed: 12/16/2020 5:52:28 PM By: Lorrin Jackson Entered By: Lorrin Jackson on 12/16/2020 14:46:49 -------------------------------------------------------------------------------- Encounter Discharge Information Details Patient Name: Date of Service: [name], [name] 12/16/2020 12:30 PM Medical Record Number: [id number] Patient Account Number: [id number] Date of Birth/Sex: Treating RN: [birth year] (87 y.o. Female) Lorrin Jackson Primary Care  Vaneta Hammontree: Lajean Manes T Other Clinician: Referring Lillyanne Bradburn: Treating Wallice Granville/Extender: [name] Weeks in Treatment: 0 Encounter Discharge Information Items Discharge Condition: Stable Ambulatory Status: Wheelchair Discharge Destination: Home Transportation: Private Auto Accompanied By: Daughter Schedule Follow-up Appointment: Yes Clinical Summary of Care: Provided on 12/16/2020 Form Type Recipient Paper Patient Patient Electronic Signature(s) Signed: 12/16/2020 3:54:22 PM By: Lorrin Jackson Entered By: Lorrin Jackson on 12/16/2020 15:54:22 -------------------------------------------------------------------------------- Lower Extremity Assessment Details Patient Name: Date of Service: [name], [name] 12/16/2020 12:30 PM Medical Record Number: [id number] Patient Account Number: [id number] Date of Birth/Sex: Treating RN: [birth year] (87 y.o. Female) Lorrin Jackson Primary Care Stevin Bielinski: Lajean Manes T Other Clinician: Referring Kashlynn Kundert: Treating Andru Genter/Extender: [name] in Treatment: 0 Edema Assessment Assessed: [Left: Yes] [Right: No] Edema: [Left: N] [Right: o] Calf Left: Right: Point of Measurement: 31 cm From Medial Instep 31 cm Ankle Left: Right: Point of Measurement: 10 cm From Medial Instep 21 cm Vascular Assessment Pulses: Dorsalis Pedis Palpable: [Left:Yes] Notes Per Vascular R and L Non-Compressible Electronic Signature(s) Signed: 12/16/2020 5:52:28 PM By: Lorrin Jackson Entered By: Lorrin Jackson on 12/16/2020 14:06:34 -------------------------------------------------------------------------------- Multi Wound Chart Details Patient Name: Date of Service: [name], [name] 12/16/2020 12:30 PM Medical Record Number: [id number] Patient Account Number: [id number] Date of Birth/Sex: Treating RN: 04-14-1921 (87 y.o. Female) Deon Pilling Primary Care Yarelie Hams: Lajean Manes T Other  Clinician: Referring Mahkayla Preece: Treating Genette Huertas/Extender: [name] in Treatment: 0 Vital Signs Height(in): Capillary Blood Glucose(mg/dl): 116 Weight(lbs): Pulse(bpm): 64 Body Mass Index(BMI): Blood Pressure(mmHg): 147/81 Temperature(F): 97.8 Respiratory Rate(breaths/min): 18 Photos: [1:No Photos Left T Great oe] [N/A:N/A N/A] Wound Location: [1:Other Lesion] [N/A:N/A] Wounding Event: [1:Diabetic Wound/Ulcer of the Lower] [N/A:N/A] Primary Etiology: [1:Extremity Congestive Heart Failure,] [N/A:N/A] Comorbid History: [1:Hypertension, Peripheral Arterial Disease, Phlebitis, Type II Diabetes, Osteoarthritis 11/24/2019] [N/A:N/A] Date Acquired: [1:0] [N/A:N/A] Weeks of Treatment: [1:Open] [N/A:N/A] Wound Status: [1:1.2x1.3x1.9] [N/A:N/A] Measurements L x W x D (cm) [1:1.225] [N/A:N/A] A (cm) : rea [  1:2.328] [N/A:N/A] Volume (cm) : [1:0.00%] [N/A:N/A] % Reduction in A rea: [1:0.00%] [N/A:N/A] % Reduction in Volume: [1:9] Starting Position 1 (o'clock): [1:11] Ending Position 1 (o'clock): [1:0.5] Maximum Distance 1 (cm): [1:Yes] [N/A:N/A] Undermining: [1:Grade 2] [N/A:N/A] Classification: [1:Medium] [N/A:N/A] Exudate A mount: [1:Serosanguineous] [N/A:N/A] Exudate Type: [1:red, brown] [N/A:N/A] Exudate Color: [1:Well defined, not attached] [N/A:N/A] Wound Margin: [1:Large (67-100%)] [N/A:N/A] Granulation A mount: [1:Red, Friable] [N/A:N/A] Granulation Quality: [1:None Present (0%)] [N/A:N/A] Necrotic A mount: [1:Fat Layer (Subcutaneous Tissue): Yes N/A] Exposed Structures: [1:Bone: Yes Fascia: No Tendon: No Muscle: No Joint: No calloused periwound] [N/A:N/A] Treatment Notes Electronic Signature(s) Signed: 12/16/2020 3:45:45 PM By: Kalman Shan DO Signed: 12/16/2020 6:21:36 PM By: Deon Pilling Entered By: Kalman Shan on 12/16/2020  15:09:46 -------------------------------------------------------------------------------- Multi-Disciplinary Care Plan Details Patient Name: Date of Service: [name], [name] 12/16/2020 12:30 PM Medical Record Number: [id number] Patient Account Number: [id number] Date of Birth/Sex: Treating RN: September 27, 1920 (87 y.o. Female) Lorrin Jackson Primary Care Seneca Gadbois: Lajean Manes T Other Clinician: Referring Kaylum Shrum: Treating Lilas Diefendorf/Extender: [name] in Treatment: 0 Active Inactive Orientation to the Wound Care Program Nursing Diagnoses: Knowledge deficit related to the wound healing center program Goals: Patient/caregiver will verbalize understanding of the [facility] Date Initiated: 12/16/2020 Target Resolution Date: 01/13/2021 Goal Status: Active Interventions: Provide education on orientation to the wound center Notes: Wound/Skin Impairment Nursing Diagnoses: Impaired tissue integrity Goals: Patient/caregiver will verbalize understanding of skin care regimen Date Initiated: 12/16/2020 Target Resolution Date: 01/13/2021 Goal Status: Active Ulcer/skin breakdown will have a volume reduction of 30% by week 4 Date Initiated: 12/16/2020 Target Resolution Date: 01/13/2021 Goal Status: Active Interventions: Assess patient/caregiver ability to obtain necessary supplies Assess patient/caregiver ability to perform ulcer/skin care regimen upon admission and as needed Assess ulceration(s) every visit Provide education on ulcer and skin care Treatment Activities: Skin care regimen initiated : 12/16/2020 Topical wound management initiated : 12/16/2020 Notes: Electronic Signature(s) Signed: 12/16/2020 5:52:28 PM By: Lorrin Jackson Entered By: Lorrin Jackson on 12/16/2020 14:35:24 -------------------------------------------------------------------------------- Pain Assessment Details Patient Name: Date of Service: [name], [name] 12/16/2020  12:30 PM Medical Record Number: [id number] Patient Account Number: [id number] Date of Birth/Sex: Treating RN: 14-Nov-1920 (87 y.o. Female) Lorrin Jackson Primary Care Dsean Vantol: Lajean Manes T Other Clinician: Referring Rhemi Balbach: Treating Sarann Tregre/Extender: [name] in Treatment: 0 Active Problems Location of Pain Severity and Description of Pain Patient Has Paino Yes Site Locations Pain Location: Pain in Ulcers With Dressing Change: Yes Duration of the Pain. Constant / Intermittento Intermittent Rate the pain. Current Pain Level: 3 Character of Pain Describe the Pain: Burning, Tender Pain Management and Medication Current Pain Management: Medication: Yes Cold Application: No Rest: Yes Massage: No Activity: No T.E.N.S.: No Heat Application: No Leg drop or elevation: No Is the Current Pain Management Adequate: Inadequate How does your wound impact your activities of daily livingo Sleep: Yes Bathing: No Appetite: No Relationship With Others: No Bladder Continence: No Emotions: No Bowel Continence: No Work: No Toileting: No Drive: No Dressing: No Hobbies: No Electronic Signature(s) Signed: 12/16/2020 5:52:28 PM By: Lorrin Jackson Entered By: Lorrin Jackson on 12/16/2020 14:18:53 -------------------------------------------------------------------------------- Patient/Caregiver Education Details Patient Name: Date of Service: [name], [name] 5/5/2022andnbsp12:30 PM Medical Record Number: [id number] Patient Account Number: [id number] Date of Birth/Gender: Treating RN: 1921/04/19 (87 y.o. Female) Lorrin Jackson Primary Care Physician: Lajean Manes T Other Clinician: Referring Physician: Treating Physician/Extender: [name] in Treatment: 0

## 2020-12-17 ENCOUNTER — Other Ambulatory Visit: Payer: Self-pay | Admitting: Internal Medicine

## 2020-12-17 ENCOUNTER — Other Ambulatory Visit (HOSPITAL_COMMUNITY): Payer: Self-pay | Admitting: Internal Medicine

## 2020-12-17 DIAGNOSIS — L97529 Non-pressure chronic ulcer of other part of left foot with unspecified severity: Secondary | ICD-10-CM

## 2020-12-18 DIAGNOSIS — I5032 Chronic diastolic (congestive) heart failure: Secondary | ICD-10-CM | POA: Diagnosis not present

## 2020-12-18 DIAGNOSIS — E11621 Type 2 diabetes mellitus with foot ulcer: Secondary | ICD-10-CM | POA: Diagnosis not present

## 2020-12-18 DIAGNOSIS — I110 Hypertensive heart disease with heart failure: Secondary | ICD-10-CM | POA: Diagnosis not present

## 2020-12-18 DIAGNOSIS — E113293 Type 2 diabetes mellitus with mild nonproliferative diabetic retinopathy without macular edema, bilateral: Secondary | ICD-10-CM | POA: Diagnosis not present

## 2020-12-18 DIAGNOSIS — E1140 Type 2 diabetes mellitus with diabetic neuropathy, unspecified: Secondary | ICD-10-CM | POA: Diagnosis not present

## 2020-12-18 DIAGNOSIS — L97529 Non-pressure chronic ulcer of other part of left foot with unspecified severity: Secondary | ICD-10-CM | POA: Diagnosis not present

## 2020-12-20 ENCOUNTER — Encounter (HOSPITAL_BASED_OUTPATIENT_CLINIC_OR_DEPARTMENT_OTHER): Payer: Medicare Other | Admitting: Internal Medicine

## 2020-12-20 ENCOUNTER — Other Ambulatory Visit: Payer: Self-pay

## 2020-12-20 DIAGNOSIS — E1122 Type 2 diabetes mellitus with diabetic chronic kidney disease: Secondary | ICD-10-CM | POA: Diagnosis not present

## 2020-12-20 DIAGNOSIS — L97529 Non-pressure chronic ulcer of other part of left foot with unspecified severity: Secondary | ICD-10-CM | POA: Diagnosis not present

## 2020-12-20 DIAGNOSIS — E1151 Type 2 diabetes mellitus with diabetic peripheral angiopathy without gangrene: Secondary | ICD-10-CM | POA: Diagnosis not present

## 2020-12-20 DIAGNOSIS — I130 Hypertensive heart and chronic kidney disease with heart failure and stage 1 through stage 4 chronic kidney disease, or unspecified chronic kidney disease: Secondary | ICD-10-CM | POA: Diagnosis not present

## 2020-12-20 DIAGNOSIS — E11621 Type 2 diabetes mellitus with foot ulcer: Secondary | ICD-10-CM

## 2020-12-20 DIAGNOSIS — M86172 Other acute osteomyelitis, left ankle and foot: Secondary | ICD-10-CM | POA: Diagnosis not present

## 2020-12-20 NOTE — Progress Notes (Addendum)
Heidi Dominguez, Heidi Dominguez ([id number]) Visit Report for 12/20/2020 Arrival Information Details Patient Name: Date of Service: [name], [name] 12/20/2020 2:45 PM Medical Record Number: [id number] Patient Account Number: [id number] Date of Birth/Sex: Treating RN: [birth year] (87 y.o. Heidi Dominguez Primary Care Heidi Dominguez: Heidi Dominguez T Other Clinician: Referring Heidi Dominguez: Treating Heidi Dominguez/Extender: Heidi Dominguez Weeks in Treatment: 0 Visit Information History Since Last Visit Added or deleted any medications: No Patient Arrived: Wheel Chair Any new allergies or adverse reactions: No Arrival Time: 15:30 Had a fall or experienced change in No Accompanied By: caregiver and son activities of daily living that may affect Transfer Assistance: None risk of falls: Patient Identification Verified: Yes Signs or symptoms of abuse/neglect since last visito No Secondary Verification Process Completed: Yes Hospitalized since last visit: No Patient Requires Transmission-Based No Implantable device outside of the clinic excluding No Precautions: cellular tissue based products placed in the center Patient Has Alerts: Yes since last visit: Patient Alerts: Patient on Blood Thinner Has Dressing in Place as Prescribed: Yes ABI RandL=Non Pain Present Now: No Compressible TBI R=0.87 L=0.62 Electronic Signature(s) Signed: 12/20/2020 6:03:56 PM By: Heidi Dominguez Entered By: Heidi Dominguez on 12/20/2020 15:39:27 -------------------------------------------------------------------------------- Encounter Discharge Information Details Patient Name: Date of Service: [name] 12/20/2020 2:45 PM Medical Record Number: [id number] Patient Account Number: [id number] Date of Birth/Sex: Treating RN: 1920-12-08 (87 y.o. Heidi Dominguez Primary Care Kaushik Maul: Heidi Dominguez Other Clinician: Referring Heidi Dominguez: Treating Heidi Dominguez/Extender: Heidi Dominguez Weeks in  Treatment: 0 Encounter Discharge Information Items Discharge Condition: Stable Ambulatory Status: Wheelchair Discharge Destination: Home Transportation: Private Auto Accompanied By: caregiver and son Schedule Follow-up Appointment: Yes Clinical Summary of Care: Electronic Signature(s) Signed: 12/20/2020 6:03:56 PM By: Heidi Dominguez Entered By: Heidi Dominguez on 12/20/2020 17:35:00 -------------------------------------------------------------------------------- Lower Extremity Assessment Details Patient Name: Date of Service: [name] 12/20/2020 2:45 PM Medical Record Number: [id number] Patient Account Number: [id number] Date of Birth/Sex: Treating RN: 27-Aug-1920 (87 y.o. Heidi Dominguez Primary Care Heidi Dominguez: Heidi Dominguez T Other Clinician: Referring Heidi Dominguez: Treating Heidi Dominguez/Extender: Heidi Dominguez T Weeks in Treatment: 0 Edema Assessment Assessed: [Left: Yes] [Right: Yes] Edema: [Left: No] [Right: No] Calf Left: Right: Point of Measurement: 31 cm From Medial Instep 32 cm 31.5 cm Ankle Left: Right: Point of Measurement: 10 cm From Medial Instep 22 cm 20.5 cm Vascular Assessment Pulses: Dorsalis Pedis Palpable: [Left:Yes] [Right:Yes] Electronic Signature(s) Signed: 12/20/2020 6:03:56 PM By: Heidi Dominguez Entered By: Heidi Dominguez on 12/20/2020 15:40:30 -------------------------------------------------------------------------------- Multi Wound Chart Details Patient Name: Date of Service: [name] 12/20/2020 2:45 PM Medical Record Number: [id number] Patient Account Number: [id number] Date of Birth/Sex: Treating RN: 16-Sep-1920 (87 y.o. Heidi Dominguez Primary Care Heidi Dominguez: Heidi Dominguez T Other Clinician: Referring Sayid Moll: Treating Heidi Dominguez/Extender: Heidi Dominguez T Weeks in Treatment: 0 Vital Signs Height(in): Pulse(bpm): 73 Weight(lbs): Blood Pressure(mmHg): 153/83 Body Mass  Index(BMI): Temperature(F): 97.7 Respiratory Rate(breaths/min): 18 Photos: [1:No Photos Left T Great oe] [N/A:N/A N/A] Wound Location: [1:Other Lesion] [N/A:N/A] Wounding Event: [1:Diabetic Wound/Ulcer of the Lower] [N/A:N/A] Primary Etiology: [1:Extremity Congestive Heart Failure,] [N/A:N/A] Comorbid History: [1:Hypertension, Peripheral Arterial Disease, Phlebitis, Type II Diabetes, Osteoarthritis 11/24/2019] [N/A:N/A] Date Acquired: [1:0] [N/A:N/A] Weeks of Treatment: [1:Open] [N/A:N/A] Wound Status: [1:1x1.1x1.4] [N/A:N/A] Measurements L x W x D (cm) [1:0.864] [N/A:N/A] A (cm) : rea [1:1.21] [N/A:N/A] Volume (cm) : [1:29.50%] [N/A:N/A] % Reduction in A rea: [1:48.00%] [N/A:N/A] % Reduction in Volume: [  1:6] Starting Position 1 (o'clock): [1:12] Ending Position 1 (o'clock): [1:0.4] Maximum Distance 1 (cm): [1:Yes] [N/A:N/A] Undermining: [1:Grade 2] [N/A:N/A] Classification: [1:Medium] [N/A:N/A] Exudate A mount: [1:Serosanguineous] [N/A:N/A] Exudate Type: [1:red, brown] [N/A:N/A] Exudate Color: [1:Well defined, not attached] [N/A:N/A] Wound Margin: [1:Large (67-100%)] [N/A:N/A] Granulation A mount: [1:Red, Friable] [N/A:N/A] Granulation Quality: [1:None Present (0%)] [N/A:N/A] Necrotic A mount: [1:Fat Layer (Subcutaneous Tissue): Yes N/A] Exposed Structures: [1:Bone: Yes Fascia: No Tendon: No Muscle: No Joint: No Small (1-33%)] [N/A:N/A] Treatment Notes Electronic Signature(s) Signed: 12/20/2020 4:18:15 PM By: Heidi Corwin DO Signed: 12/20/2020 5:39:20 PM By: Heidi Abts RN, BSN Entered By: Heidi Dominguez on 12/20/2020 16:12:33 -------------------------------------------------------------------------------- Multi-Disciplinary Care Plan Details Patient Name: Date of Service: [name] 12/20/2020 2:45 PM Medical Record Number: [id number] Patient Account Number: [id number] Date of Birth/Sex: Treating RN: August 15, 1920 (87 y.o. Heidi Dominguez Primary Care  Heidi Dominguez: Heidi Dominguez Other Clinician: Referring Heidi Dominguez: Treating Heidi Dominguez/Extender: Heidi Dominguez T Weeks in Treatment: 0 Active Inactive Orientation to the Wound Care Program Nursing Diagnoses: Knowledge deficit related to the wound healing center program Goals: Patient/caregiver will verbalize understanding of the Wound Healing Center Program Date Initiated: 12/16/2020 Target Resolution Date: 01/13/2021 Goal Status: Active Interventions: Provide education on orientation to the wound center Notes: Wound/Skin Impairment Nursing Diagnoses: Impaired tissue integrity Goals: Patient/caregiver will verbalize understanding of skin care regimen Date Initiated: 12/16/2020 Target Resolution Date: 01/13/2021 Goal Status: Active Ulcer/skin breakdown will have a volume reduction of 30% by week 4 Date Initiated: 12/16/2020 Target Resolution Date: 01/13/2021 Goal Status: Active Interventions: Assess patient/caregiver ability to obtain necessary supplies Assess patient/caregiver ability to perform ulcer/skin care regimen upon admission and as needed Assess ulceration(s) every visit Provide education on ulcer and skin care Treatment Activities: Skin care regimen initiated : 12/16/2020 Topical wound management initiated : 12/16/2020 Notes: Electronic Signature(s) Signed: 12/20/2020 5:39:20 PM By: Heidi Abts RN, BSN Entered By: Heidi Dominguez on 12/20/2020 17:34:57 -------------------------------------------------------------------------------- Pain Assessment Details Patient Name: Date of Service: [name] 12/20/2020 2:45 PM Medical Record Number: [id number] Patient Account Number: [id number] Date of Birth/Sex: Treating RN: 06/14/1921 (87 y.o. Heidi Dominguez Primary Care Kimimila Tauzin: Heidi Dominguez Other Clinician: Referring Odetta Forness: Treating Krystan Northrop/Extender: Heidi Dominguez T Weeks in Treatment: 0 Active Problems Location of Pain  Severity and Description of Pain Patient Has Paino No Site Locations Rate the pain. Current Pain Level: 0 Pain Management and Medication Current Pain Management: Medication: No Cold Application: No Rest: No Massage: No Activity: No T.E.N.S.: No Heat Application: No Leg drop or elevation: No Is the Current Pain Management Adequate: Adequate How does your wound impact your activities of daily livingo Sleep: No Bathing: No Appetite: No Relationship With Others: No Bladder Continence: No Emotions: No Bowel Continence: No Work: No Toileting: No Drive: No Dressing: No Hobbies: No Electronic Signature(s) Signed: 12/20/2020 6:03:56 PM By: Heidi Dominguez Entered By: Heidi Dominguez on 12/20/2020 15:39:55 -------------------------------------------------------------------------------- Patient/Caregiver Education Details Patient Name: Date of Service: [name] 5/9/2022andnbsp2:45 PM Medical Record Number: [id number] Patient Account Number: [id number] Date of Birth/Gender: Treating RN: 01/30/1921 (87 y.o. Heidi Dominguez Primary Care Physician: Heidi Dominguez T Other Clinician: Referring Physician: Treating Physician/Extender: Sharren Bridge in Treatment: 0 Education Assessment Education Provided To: Patient Education Topics Provided Wound/Skin Impairment: Methods: Explain/Verbal Responses: State content correctly Electronic Signature(s) Signed: 12/20/2020 5:39:20 PM By: Heidi Abts RN, BSN Entered By: Heidi Dominguez on 12/20/2020 17:35:08 -------------------------------------------------------------------------------- Wound Assessment Details  Patient Name: Date of Service: [name], [name] 12/20/2020 2:45 PM Medical Record Number: [id number] Patient Account Number: [id number] Date of Birth/Sex: Treating RN: 31-Dec-1920 (87 y.o. Heidi Dominguez Primary Care Milliani Herrada: Heidi Dominguez T Other Clinician: Referring  Sybilla Malhotra: Treating Nijah Tejera/Extender: Heidi Dominguez T Weeks in Treatment: 0 Wound Status Wound Number: 1 Primary Diabetic Wound/Ulcer of the Lower Extremity Etiology: Wound Location: Left T Great oe Wound Open Wounding Event: Other Lesion Status: Date Acquired: 11/24/2019 Comorbid Congestive Heart Failure, Hypertension, Peripheral Arterial Weeks Of Treatment: 0 History: Disease, Phlebitis, Type II Diabetes, Osteoarthritis Clustered Wound: No Photos Wound Measurements Length: (cm) 1 Width: (cm) 1.1 Depth: (cm) 1.4 Area: (cm) 0.864 Volume: (cm) 1.21 % Reduction in Area: 29.5% % Reduction in Volume: 48% Epithelialization: Small (1-33%) Tunneling: No Undermining: Yes Starting Position (o'clock): 6 Ending Position (o'clock): 12 Maximum Distance: (cm) 0.4 Wound Description Classification: Grade 2 Wound Margin: Well defined, not attached Exudate Amount: Medium Exudate Type: Serosanguineous Exudate Color: red, brown Foul Odor After Cleansing: No Slough/Fibrino No Wound Bed Granulation Amount: Large (67-100%) Exposed Structure Granulation Quality: Red, Friable Fascia Exposed: No Necrotic Amount: None Present (0%) Fat Layer (Subcutaneous Tissue) Exposed: Yes Tendon Exposed: No Muscle Exposed: No Joint Exposed: No Bone Exposed: Yes Treatment Notes Wound #1 (Toe Great) Wound Laterality: Left Cleanser Normal Saline Discharge Instruction: Cleanse the wound with Normal Saline or wound cleanser prior to applying a clean dressing using gauze sponges, not tissue or cotton balls. Soap and Water Discharge Instruction: May shower and wash wound with dial antibacterial soap and water prior to dressing change. Peri-Wound Care Topical Primary Dressing FIBRACOL Plus Dressing, 2x2 in (collagen) Discharge Instruction: Moisten collagen with saline or hydrogel Secondary Dressing Woven Gauze Sponge, Non-Sterile 4x4 in Discharge Instruction: Apply over primary  dressing as directed. Felt 2.5 yds x 5.5 in Discharge Instruction: Apply as donut over primary dressing Secured With Conforming Stretch Gauze Bandage, Sterile 2x75 (in/in) Discharge Instruction: Secure with stretch gauze as directed. 43M Medipore H Soft Cloth Surgical T 4 x 2 (in/yd) ape Discharge Instruction: Secure dressing with tape as directed. Compression Wrap Compression Stockings Add-Ons Electronic Signature(s) Signed: 12/21/2020 6:10:24 PM By: Heidi Dominguez Signed: 12/22/2020 9:46:23 AM By: Karl Ito Previous Signature: 12/20/2020 6:03:56 PM Version By: Heidi Dominguez Entered By: Karl Ito on 12/21/2020 16:17:48 -------------------------------------------------------------------------------- Vitals Details Patient Name: Date of Service: [name], [name] 12/20/2020 2:45 PM Medical Record Number: [id number] Patient Account Number: [id number] Date of Birth/Sex: Treating RN: Dec 02, 1920 (87 y.o. Heidi Dominguez Primary Care Mohammad Granade: Heidi Dominguez T Other Clinician: Referring Deandre Brannan: Treating Suzanne Garbers/Extender: Heidi Dominguez T Weeks in Treatment: 0 Vital Signs Time Taken: 15:30 Temperature (F): 97.7 Pulse (bpm): 73 Respiratory Rate (breaths/min): 18 Blood Pressure (mmHg): 153/83 Reference Range: 80 - 120 mg / dl Electronic Signature(s) Signed: 12/20/2020 6:03:56 PM By: Heidi Dominguez Entered By: Heidi Dominguez on 12/20/2020 15:39:43

## 2020-12-21 ENCOUNTER — Other Ambulatory Visit: Payer: Self-pay

## 2020-12-21 ENCOUNTER — Encounter: Payer: Self-pay | Admitting: Vascular Surgery

## 2020-12-21 ENCOUNTER — Ambulatory Visit (INDEPENDENT_AMBULATORY_CARE_PROVIDER_SITE_OTHER): Payer: Medicare Other | Admitting: Vascular Surgery

## 2020-12-21 VITALS — BP 115/70 | HR 79 | Temp 98.1°F | Resp 20 | Ht 64.0 in | Wt 165.0 lb

## 2020-12-21 DIAGNOSIS — I7025 Atherosclerosis of native arteries of other extremities with ulceration: Secondary | ICD-10-CM

## 2020-12-21 NOTE — Progress Notes (Signed)
HELVI, ROYALS ([id number]) Visit Report for 12/20/2020 Chief Complaint Document Details Patient Name: Date of Service: [name], [name] 12/20/2020 2:45 PM Medical Record Number: [id number] Patient Account Number: [id number] Date of Birth/Sex: Treating RN: 02-14-1921 (88 y.o. Nancy Fetter Primary Care Provider: Patrina Levering Other Clinician: Referring Provider: Treating Provider/Extender: [name] in Treatment: 0 Information Obtained from: Patient Chief Complaint Left great toe wound Electronic Signature(s) Signed: 12/20/2020 4:18:15 PM By: Kalman Shan DO Entered By: Kalman Shan on 12/20/2020 16:12:42 -------------------------------------------------------------------------------- HPI Details Patient Name: Date of Service: [name], [name] 12/20/2020 2:45 PM Medical Record Number: [id number] Patient Account Number: [id number] Date of Birth/Sex: Treating RN: 02-28-1921 (87 y.o. Nancy Fetter Primary Care Provider: Patrina Levering Other Clinician: Referring Provider: Treating Provider/Extender: [name] in Treatment: 0 History of Present Illness HPI Description: Admission 5/5 Ms. Sliwinski is a 87 year old female with a past medical history of type 2 diabetes on oral agents, essential hypertension, chronic diastolic congestive heart failure and CKD 3 that presents to our office for a diabetic foot wound located to the left first great toe. Patient is accompanied by daughter and caregiver who helps provide the history. The wound started 1 year ago and has not shown signs of improvement. In fact they state that for the past month it has gotten worse. She has been following with podiatry for this issue. She does not report having an image done of this foot. She states that recently she was placed on doxycycline because a home health nurse was concerned about an infection. She is using collagen every  other day to the wound site. She currently denies any fever/chills, purulent drainage, increased warmth or erythema to the foot. 5/9; patient presents for 1 week follow-up. She states she feels well. She has been using collagen for dressing changes with no issues. She denies any acute signs of infection. She denies fever/chills, nausea/vomiting or drainage From the wound. She is scheduled for an MRI on 5/18 Electronic Signature(s) Signed: 12/20/2020 4:18:15 PM By: Kalman Shan DO Entered By: Kalman Shan on 12/20/2020 16:13:47 -------------------------------------------------------------------------------- Physical Exam Details Patient Name: Date of Service: [name], [name] 12/20/2020 2:45 PM Medical Record Number: [id number] Patient Account Number: [id number] Date of Birth/Sex: Treating RN: 25-Jan-1921 (87 y.o. Nancy Fetter Primary Care Provider: Patrina Levering Other Clinician: Referring Provider: Treating Provider/Extender: [name] in Treatment: 0 Constitutional respirations regular, non-labored and within target range for patient.. Cardiovascular 2+ dorsalis pedis/posterior tibialis pulses. Psychiatric pleasant and cooperative. Notes Left great toe: Open wound with probing to the bone. There is granulation tissue. Appearance has improved since last clinic visit. No acute signs of soft tissue infection including purulent drainage increased warmth or erythema to the foot. Electronic Signature(s) Signed: 12/20/2020 4:18:15 PM By: Kalman Shan DO Entered By: Kalman Shan on 12/20/2020 16:15:44 -------------------------------------------------------------------------------- Physician Orders Details Patient Name: Date of Service: [name], [name] 12/20/2020 2:45 PM Medical Record Number: [id number] Patient Account Number: [id number] Date of Birth/Sex: Treating RN: 26-Jul-1921 (87 y.o. Nancy Fetter Primary Care Provider:  Patrina Levering Other Clinician: Referring Provider: Treating Provider/Extender: [name] in Treatment: 0 Verbal / Phone Orders: No Diagnosis Coding ICD-10 Coding Code Description L97.529 Non-pressure chronic ulcer of other part of left foot with unspecified severity E11.621 Type 2 diabetes mellitus with foot ulcer I10 Essential (primary) hypertension I50.32  Chronic diastolic (congestive) heart failure Follow-up Appointments ppointment in: - Friday 5/20 with Dr. Heber [place] Return A Bathing/ Shower/ Hygiene May shower and wash wound with soap and water. Edema Control - Lymphedema / SCD / Other Elevate legs to the level of the heart or above for 30 minutes daily and/or when sitting, a frequency of: - throughout the day Moisturize legs daily. Off-Loading Other: - Shoes on only when up walking, do not walk around bare foot Additional Orders / Instructions Follow Nutritious Diet - 100-120g of Protein Home Health No change in wound care orders this week; continue Home Health for wound care. May utilize formulary equivalent dressing for wound treatment orders unless otherwise specified. Other Home Health Orders/Instructions: - [facility]ound Treatment Wound #1 - T Great oe Wound Laterality: Left Cleanser: Normal Saline (Home Health) Every Other Day/15 Days Discharge Instructions: Cleanse the wound with Normal Saline or wound cleanser prior to applying a clean dressing using gauze sponges, not tissue or cotton balls. Cleanser: Soap and Water [facility]) Every Other Day/15 Days Discharge Instructions: May shower and wash wound with dial antibacterial soap and water prior to dressing change. Prim Dressing: FIBRACOL Plus Dressing, 2x2 in (collagen) (Home Health) Every Other Day/15 Days ary Discharge Instructions: Moisten collagen with saline or hydrogel Secondary Dressing: Woven Gauze Sponge, Non-Sterile 4x4 in [facility]) Every Other Day/15  Days Discharge Instructions: Apply over primary dressing as directed. Secondary Dressing: Felt 2.5 yds x 5.5 in [facility]) Every Other Day/15 Days Discharge Instructions: Apply as donut over primary dressing Secured With: Conforming Stretch Gauze Bandage, Sterile 2x75 (in/in) (Home Health) Every Other Day/15 Days Discharge Instructions: Secure with stretch gauze as directed. Secured With: 56M Medipore H Soft Cloth Surgical T 4 x 2 (in/yd) (Home Health) Every Other Day/15 Days ape Discharge Instructions: Secure dressing with tape as directed. Electronic Signature(s) Signed: 12/20/2020 4:18:15 PM By: Kalman Shan DO Entered By: Kalman Shan on 12/20/2020 16:16:00 -------------------------------------------------------------------------------- Problem List Details Patient Name: Date of Service: [name], [name] 12/20/2020 2:45 PM Medical Record Number: [id number] Patient Account Number: [id number] Date of Birth/Sex: Treating RN: [birth year] (87 y.o. Nancy Fetter Primary Care Provider: Patrina Levering Other Clinician: Referring Provider: Treating Provider/Extender: [name] in Treatment: 0 Active Problems ICD-10 Encounter Code Description Active Date MDM Diagnosis L97.529 Non-pressure chronic ulcer of other part of left foot with unspecified severity 12/16/2020 No Yes E11.621 Type 2 diabetes mellitus with foot ulcer 12/16/2020 No Yes I10 Essential (primary) hypertension 12/16/2020 No Yes [id number] Chronic diastolic (congestive) heart failure 12/16/2020 No Yes Inactive Problems Resolved Problems Electronic Signature(s) Signed: 12/20/2020 4:18:15 PM By: Kalman Shan DO Entered By: Kalman Shan on 12/20/2020 16:12:21 -------------------------------------------------------------------------------- Progress Note Details Patient Name: Date of Service: [name], [name] 12/20/2020 2:45 PM Medical Record Number: [id number] Patient Account  Number: [id number] Date of Birth/Sex: Treating RN: 09-15-1920 (87 y.o. Nancy Fetter Primary Care Provider: Patrina Levering Other Clinician: Referring Provider: Treating Provider/Extender: [name] in Treatment: 0 Subjective Chief Complaint Information obtained from Patient Left great toe wound History of Present Illness (HPI) Admission 5/5 Ms. Eisenbraun is a 87 year old female with a past medical history of type 2 diabetes on oral agents, essential hypertension, chronic diastolic congestive heart failure and CKD 3 that presents to our office for a diabetic foot wound located to the left first great toe. Patient is accompanied by daughter and caregiver who helps provide the history. The wound  started 1 year ago and has not shown signs of improvement. In fact they state that for the past month it has gotten worse. She has been following with podiatry for this issue. She does not report having an image done of this foot. She states that recently she was placed on doxycycline because a home health nurse was concerned about an infection. She is using collagen every other day to the wound site. She currently denies any fever/chills, purulent drainage, increased warmth or erythema to the foot. 5/9; patient presents for 1 week follow-up. She states she feels well. She has been using collagen for dressing changes with no issues. She denies any acute signs of infection. She denies fever/chills, nausea/vomiting or drainage From the wound. She is scheduled for an MRI on 5/18 Patient History Information obtained from Patient. Family History Cancer - Siblings, Diabetes - Mother,Maternal Grandparents, Heart Disease - Mother, Hypertension - Mother, Stroke - Siblings, No family history of Hereditary Spherocytosis, Kidney Disease, Lung Disease, Seizures, Thyroid Problems, Tuberculosis. Social History Never smoker, Marital Status - Widowed, Alcohol Use - Never, Drug Use -  No History, Caffeine Use - Daily. Medical History Cardiovascular Patient has history of Congestive Heart Failure, Hypertension, Peripheral Arterial Disease, Phlebitis Endocrine Patient has history of Type II Diabetes Musculoskeletal Patient has history of Osteoarthritis Medical A Surgical History Notes nd Eyes Degenerative Eye Disease Respiratory Uses inhaler Gastrointestinal Hiatal Hernia Genitourinary CKD stage 3-Incontinence Objective Constitutional respirations regular, non-labored and within target range for patient.. Vitals Time Taken: 3:30 PM, Temperature: 97.7 F, Pulse: 73 bpm, Respiratory Rate: 18 breaths/min, Blood Pressure: 153/83 mmHg. Cardiovascular 2+ dorsalis pedis/posterior tibialis pulses. Psychiatric pleasant and cooperative. General Notes: Left great toe: Open wound with probing to the bone. There is granulation tissue. Appearance has improved since last clinic visit. No acute signs of soft tissue infection including purulent drainage increased warmth or erythema to the foot. Integumentary (Hair, Skin) Wound #1 status is Open. Original cause of wound was Other Lesion. The date acquired was: 11/24/2019. The wound is located on the Left T Great. The wound oe measures 1cm length x 1.1cm width x 1.4cm depth; 0.864cm^2 area and 1.21cm^3 volume. There is bone and Fat Layer (Subcutaneous Tissue) exposed. There is no tunneling noted, however, there is undermining starting at 6:00 and ending at 12:00 with a maximum distance of 0.4cm. There is a medium amount of serosanguineous drainage noted. The wound margin is well defined and not attached to the wound base. There is large (67-100%) red, friable granulation within the wound bed. There is no necrotic tissue within the wound bed. Assessment Active Problems ICD-10 Non-pressure chronic ulcer of other part of left foot with unspecified severity Type 2 diabetes mellitus with foot ulcer Essential (primary)  hypertension Chronic diastolic (congestive) heart failure Patient presents today for 1 week follow-up and wound shows some improvement in appearance. Overall there is stability and patient feels well. There is still probing to the bone highly suspicious for osteomyelitis and patient has an MRI next week. I would like to see her after the MRI to have the results and discuss further plans. For now she can continue collagen with dressing changes. Plan Follow-up Appointments: Return Appointment in: - Friday 5/20 with Dr. Heber [place] Bathing/ Shower/ Hygiene: May shower and wash wound with soap and water. Edema Control - Lymphedema / SCD / Other: Elevate legs to the level of the heart or above for 30 minutes daily and/or when sitting, a frequency of: - throughout the day Moisturize legs  daily. Off-Loading: Other: - Shoes on only when up walking, do not walk around bare foot Additional Orders / Instructions: Follow Nutritious Diet - 100-120g of Protein Home Health: No change in wound care orders this week; continue Home Health for wound care. May utilize formulary equivalent dressing for wound treatment orders unless otherwise specified. Other Home Health Orders/Instructions: - [facility] WOUND #1: - T Great Wound Laterality: Left oe Cleanser: Normal Saline (Home Health) Every Other Day/15 Days Discharge Instructions: Cleanse the wound with Normal Saline or wound cleanser prior to applying a clean dressing using gauze sponges, not tissue or cotton balls. Cleanser: Soap and Water [facility]) Every Other Day/15 Days Discharge Instructions: May shower and wash wound with dial antibacterial soap and water prior to dressing change. Prim Dressing: FIBRACOL Plus Dressing, 2x2 in (collagen) (Home Health) Every Other Day/15 Days ary Discharge Instructions: Moisten collagen with saline or hydrogel Secondary Dressing: Woven Gauze Sponge, Non-Sterile 4x4 in [facility]) Every Other Day/15 Days Discharge  Instructions: Apply over primary dressing as directed. Secondary Dressing: Felt 2.5 yds x 5.5 in [facility]) Every Other Day/15 Days Discharge Instructions: Apply as donut over primary dressing Secured With: Conforming Stretch Gauze Bandage, Sterile 2x75 (in/in) (Home Health) Every Other Day/15 Days Discharge Instructions: Secure with stretch gauze as directed. Secured With: 71M Medipore H Soft Cloth Surgical T 4 x 2 (in/yd) (Home Health) Every Other Day/15 Days ape Discharge Instructions: Secure dressing with tape as directed. 1. Collagen every other day 2. MRI on the 18th 3. Follow-up after MRI the end of next week Electronic Signature(s) Signed: 12/20/2020 4:18:15 PM By: Kalman Shan DO Entered By: Kalman Shan on 12/20/2020 16:17:42 -------------------------------------------------------------------------------- HxROS Details Patient Name: Date of Service: [name], [name] 12/20/2020 2:45 PM Medical Record Number: [id number] Patient Account Number: [id number] Date of Birth/Sex: Treating RN: 06/21/1921 (87 y.o. Nancy Fetter Primary Care Provider: Patrina Levering Other Clinician: Referring Provider: Treating Provider/Extender: [name] Weeks in Treatment: 0 Information Obtained From Patient Eyes Medical History: Past Medical History Notes: Degenerative Eye Disease Respiratory Medical History: Past Medical History Notes: Uses inhaler Cardiovascular Medical History: Positive for: Congestive Heart Failure; Hypertension; Peripheral Arterial Disease; Phlebitis Gastrointestinal Medical History: Past Medical History Notes: Hiatal Hernia Endocrine Medical History: Positive for: Type II Diabetes Time with diabetes: 30+ years Treated with: Oral agents Blood sugar tested every day: Yes Tested : QOD Genitourinary Medical History: Past Medical History Notes: CKD stage 3-Incontinence Musculoskeletal Medical History: Positive for:  Osteoarthritis Immunizations Pneumococcal Vaccine: Received Pneumococcal Vaccination: Yes Implantable Devices None Family and Social History Cancer: Yes - Siblings; Diabetes: Yes - Mother,Maternal Grandparents; Heart Disease: Yes - Mother; Hereditary Spherocytosis: No; Hypertension: Yes - Mother; Kidney Disease: No; Lung Disease: No; Seizures: No; Stroke: Yes - Siblings; Thyroid Problems: No; Tuberculosis: No; Never smoker; Marital Status - Widowed; Alcohol Use: Never; Drug Use: No History; Caffeine Use: Daily; Financial Concerns: No; Food, Clothing or Shelter Needs: No; Support System Lacking: No; Transportation Concerns: No Electronic Signature(s) Signed: 12/20/2020 4:18:15 PM By: Kalman Shan DO Signed: 12/20/2020 5:39:20 PM By: Levan Hurst RN, BSN Entered By: Kalman Shan on 12/20/2020 16:13:54 -------------------------------------------------------------------------------- [place] Details Patient Name: Date of Service: [name], [name] 12/20/2020 Medical Record Number: [id number] Patient Account Number: [id number] Date of Birth/Sex: Treating RN: 01/07/1921 (87 y.o. Nancy Fetter Primary Care Provider: Patrina Levering Other Clinician: Referring Provider: Treating Provider/Extender: [name] in Treatment: 0 Diagnosis Coding ICD-10 Codes  Code Description L97.529 Non-pressure chronic ulcer of other part of left foot with unspecified severity E11.621 Type 2 diabetes mellitus with foot ulcer I10 Essential (primary) hypertension M42.68 Chronic diastolic (congestive) heart failure Facility Procedures CPT4 Code: [id number] Description: 99213 - WOUND CARE VISIT-LEV 3 EST PT Modifier: Quantity: 1 Physician Procedures : CPT4 Code Description Modifier [id number] - WC PHYS LEVEL 3 - EST PT ICD-10 Diagnosis Description L97.529 Non-pressure chronic ulcer of other part of left foot with unspecified severity E11.621 Type 2 diabetes mellitus  with foot ulcer Quantity: 1 Electronic Signature(s) Signed: 12/20/2020 5:39:20 PM By: Levan Hurst RN, BSN Signed: 12/21/2020 3:06:19 PM By: Kalman Shan DO Previous Signature: 12/20/2020 4:18:15 PM Version By: Kalman Shan DO Entered By: Levan Hurst on 12/20/2020 17:35:42

## 2020-12-21 NOTE — H&P (View-Only) (Signed)
[facility] [place]  ASSESSMENT / PLAN: Heidi Dominguez is a 87 y.o. female with atherosclerosis of native arteries of left lower extremity causing ulceration.  Patient counseled patients with chronic limb threatening ischemia have an annual risk of cardiovascular mortality of 25% and a annual amputation risk of 25%. Aggressive risk factor modification and intervention for limb salvage is indicated..  Recommend the following which can slow the progression of atherosclerosis and reduce the risk of major adverse cardiac / limb events:  Complete cessation from all tobacco products. Blood glucose control with goal A1c < 7%. Blood pressure control with goal blood pressure < 140/90 mmHg. Lipid reduction therapy with goal LDL-C <100 mg/dL (<70 if symptomatic from PAD).  Aspirin 81mg  PO QD.  Atorvastatin 40-80mg  PO QD (or other "high intensity" statin therapy).  Plan left lower extremity angiogram with possible intervention via right common femoral artery approach in cath lab 12/22/20.  I anticipate severe tibial disease.  CHIEF COMPLAINT: Nonhealing left great toe ulcer x1 year  HISTORY OF PRESENT ILLNESS: Heidi Dominguez is a 87 y.o. female referred to clinic for evaluation of a nonhealing left great toe ulcer.  Patient reports this is been present for about a year.  Dr. Cannon Kettle has continued meticulous local wound care of the foot.  Unfortunately, the ulcer has become more painful and is not healing prompting referral.  Patient is ambulatory about her house.  She does not walk far or fast enough to claudicate.  She does not have symptoms typical of ischemic rest pain.  Past Medical History:  Diagnosis Date  . Acute pancreatitis    hx of   . Arthritis    "qwhere" (07/04/2018)  . Chronic back pain    "all over" (07/04/2018)  . Chronic diastolic CHF (congestive heart failure) ([place])   . Chronic kidney disease    stage  3 chronic kidney disease   . Complication of  anesthesia    "I have a hard time waking up"  . Degenerative joint disease of shoulder region   . Dry eye syndrome   . Dyspnea   . Family history of adverse reaction to anesthesia    "daughter has the shakes when she wakes up"  . GERD (gastroesophageal reflux disease)   . Heart murmur   . High cholesterol   . History of hiatal hernia   . HOH (hard of hearing)   . Hypertension   . Impacted cerumen of both ears    hx of   . Muscle weakness (generalized)   . Osteoarthritis   . Overactive bladder   . Pancreatitis    hx of   . Phlebitis    "BLE"  . Rotator cuff tear    right   . Tear of right supraspinatus tendon   . Thrombocytopenia (H[facility])    hx of   . Type II diabetes mellitus ([place])   . Urinary tract infection    hx of   . Xerosis cutis    hx of     Past Surgical History:  Procedure Laterality Date  . ABDOMINAL HYSTERECTOMY    . BACK SURGERY    . CATARACT EXTRACTION W/ INTRAOCULAR LENS  IMPLANT, BILATERAL Bilateral   . CHOLECYSTECTOMY  2016  . ERCP N/A 08/30/2018   Procedure: ENDOSCOPIC RETROGRADE CHOLANGIOPANCREATOGRAPHY (ERCP);  Surgeon: Clarene Essex, MD;  Location: [facility];  Service: Endoscopy;  Laterality: N/A;  . FIXATION KYPHOPLASTY    . INCISION AND DRAINAGE Right 11/08/2018  Procedure: INCISION AND DRAINAGE right shoulder, placement of antibiotic beads ;  Surgeon: [name], MD;  Location: [facility];  Service: Orthopedics;  Laterality: Right;  . JOINT REPLACEMENT    . LAPAROSCOPIC CHOLECYSTECTOMY SINGLE SITE WITH INTRAOPERATIVE CHOLANGIOGRAM N/A 04/11/2015   Procedure: LAPAROSCOPIC LYSIS OF ADHESIONS, LAPAROSCOPIC CHOLECYSTECTOMY WITH INTRAOPERATIVE CHOLANGIOGRAM;  Surgeon: Michael Boston, MD;  Location: [facility];  Service: General;  Laterality: N/A;  . PANCREATIC STENT PLACEMENT  08/30/2018   Procedure: PANCREATIC STENT PLACEMENT;  Surgeon: Clarene Essex, MD;  Location: [facility];  Service: Endoscopy;;  . REMOVAL OF STONES  08/30/2018   Procedure: REMOVAL OF  STONES;  Surgeon: Clarene Essex, MD;  Location: [facility];  Service: Endoscopy;;  . SHOULDER OPEN ROTATOR CUFF REPAIR Bilateral   . SPHINCTEROTOMY  08/30/2018   Procedure: SPHINCTEROTOMY;  Surgeon: Clarene Essex, MD;  Location: [facility];  Service: Endoscopy;;  . TOTAL KNEE ARTHROPLASTY Bilateral     Family History  Problem Relation Age of Onset  . Hypertension Mother   . Diabetes Mellitus I Mother   . Hypertension Father     Social History   Socioeconomic History  . Marital status: Widowed    Spouse name: Not on file  . Number of children: Not on file  . Years of education: Not on file  . Highest education level: Not on file  Occupational History  . Occupation: retired  Tobacco Use  . Smoking status: Never Smoker  . Smokeless tobacco: Never Used  Vaping Use  . Vaping Use: Never used  Substance and Sexual Activity  . Alcohol use: No    Comment: rare  . Drug use: No  . Sexual activity: Not on file  Other Topics Concern  . Not on file  Social History Narrative  . Not on file   Social Determinants of Health   Financial Resource Strain: Not on file  Food Insecurity: Not on file  Transportation Needs: Not on file  Physical Activity: Not on file  Stress: Not on file  Social Connections: Not on file  Intimate Partner Violence: Not on file    Allergies  Allergen Reactions  . Amoxicillin-Pot Clavulanate Diarrhea  . Catapres [Clonidine Hcl] Other (See Comments)    Unknown. Unknown.  . Codeine Other (See Comments)    "Makes me out of my head, loopy"  . Covera-Hs [Verapamil Hcl] Other (See Comments)    Unknown.  Marland Kitchen Hydrocodone Other (See Comments)    "makes me go out of my head, loopy"  . Other Other (See Comments)    Any type of narcotics Feels "loopy" Any type of narcotics Feels "loopy"  . Sulfa Antibiotics Itching  . Sulfamethoxazole Diarrhea  . Verapamil Diarrhea and Other (See Comments)  . Sulfasalazine Itching    Current Outpatient Medications   Medication Sig Dispense Refill  . albuterol (PROVENTIL HFA;VENTOLIN HFA) 108 (90 Base) MCG/ACT inhaler Inhale into the lungs every 6 (six) hours as needed for wheezing or shortness of breath.    Marland Kitchen amLODipine (NORVASC) 10 MG tablet TK 1 T PO QD    . aspirin EC 81 MG tablet Take 81 mg by mouth daily.    . budesonide-formoterol (SYMBICORT) 160-4.5 MCG/ACT inhaler 2 puffs    . Carboxymethylcellulose Sod PF (REFRESH PLUS) 0.5 % SOLN See admin instructions.    . Cholecalciferol (VITAMIN D-3) 125 MCG (5000 UT) TABS Take 5,000 Units by mouth daily.    . ciprofloxacin (CIPRO) 500 MG tablet SMARTSIG:1 Tablet(s) By Mouth Every 12 Hours    .  colchicine 0.6 MG tablet Take 1 tablet (0.6 mg total) by mouth daily. 14 tablet 0  . doxycycline (VIBRA-TABS) 100 MG tablet Take 1 tablet (100 mg total) by mouth 2 (two) times daily. 20 tablet 0  . erythromycin ophthalmic ointment erythromycin 5 mg/gram (0.5 %) eye ointment  APPLY A SMALL AMOUNT INTO THE RIGHT EYE FOUR TIMES DAILY AS NEEDED FOR PAIN    . famotidine (PEPCID) 20 MG tablet Take one after  supper 30 tablet 2  . fentaNYL (DURAGESIC) 25 MCG/HR APP 1 PA EXT TO THE SKIN Q 72 H    . Ferrous Gluconate (IRON) 240 (27 FE) MG TABS Take 240 mg by mouth daily.     . fluconazole (DIFLUCAN) 100 MG tablet fluconazole 100 mg tablet  TAKE 1 TABLET BY MOUTH EVERY DAY FOR 1 DAY    . furosemide (LASIX) 20 MG tablet furosemide 20 mg tablet    . hydrocortisone (ANUSOL-HC) 2.5 % rectal cream Apply topically 2 (two) times daily.    . hydrocortisone 2.5 % cream hydrocortisone 2.5 % topical cream with perineal applicator    . ibuprofen (ADVIL) 200 MG tablet 1 tablet with food or milk as needed    . JANUVIA 50 MG tablet Take 50 mg by mouth daily.   5  . levalbuterol (XOPENEX) 1.25 MG/3ML nebulizer solution 1 vial in neb every 4 hours as needed    . lidocaine (XYLOCAINE) 5 % ointment APPLY TOPICALLY AS NEEDED. TOES FOR PAIN 35.44 g 0  . loratadine (CLARITIN) 10 MG tablet 1  tablet    . losartan (COZAAR) 25 MG tablet Take 25 mg by mouth daily.    Marland Kitchen losartan (COZAAR) 50 MG tablet TAKE 1 TABLET(50 MG) BY MOUTH DAILY 30 tablet 0  . Magnesium 250 MG TABS Take by mouth.    . Multiple Vitamins-Minerals (PRESERVISION AREDS 2) CAPS     . mupirocin ointment (BACTROBAN) 2 % Apply topically daily.    . Omega 3 1000 MG CAPS 1 capsule    . ONETOUCH VERIO test strip USE TO CHECK BLOOD SUGAR ONCE DAILY.    . pantoprazole (PROTONIX) 40 MG tablet Take 1 tablet (40 mg total) by mouth daily. Take 30-60 min before first meal of the day 30 tablet 5  . potassium chloride (K-DUR) 10 MEQ tablet TK 1 T PO  ONCE A DAY    . SYSTANE COMPLETE 0.6 % SOLN     . UNABLE TO FIND     . UNABLE TO FIND albuterol sulfate HFA 90 mcg/actuation aerosol inhaler  TAKE 2 PUFFS AS NEEDED EVERY 4 HOURS    . vitamin B-12 (CYANOCOBALAMIN) 1000 MCG tablet Take 1,000 mcg by mouth daily.    . vitamin B-12 (CYANOCOBALAMIN) 500 MCG tablet      No current facility-administered medications for this visit.    REVIEW OF SYSTEMS:  [X]  denotes positive finding, [ ]  denotes negative finding Cardiac  Comments:  Chest pain or chest pressure:    Shortness of breath upon exertion:    Short of breath when lying flat:    Irregular heart rhythm:        Vascular    Pain in calf, thigh, or hip brought on by ambulation:    Pain in feet at night that wakes you up from your sleep:     Blood clot in your veins:    Leg swelling:         Pulmonary    Oxygen at home:  Productive cough:     Wheezing:         Neurologic    Sudden weakness in arms or legs:     Sudden numbness in arms or legs:     Sudden onset of difficulty speaking or slurred speech:    Temporary loss of vision in one eye:     Problems with dizziness:         Gastrointestinal    Blood in stool:     Vomited blood:         Genitourinary    Burning when urinating:     Blood in urine:        Psychiatric    Major depression:         Hematologic     Bleeding problems:    Problems with blood clotting too easily:        Skin    Rashes or ulcers:        Constitutional    Fever or chills:      PHYSICAL EXAM  Vitals:   12/21/20 1336  BP: 115/70  Pulse: 79  Resp: 20  Temp: 98.1 F (36.7 C)  SpO2: 98%  Weight: 165 lb (74.8 kg)  Height: 5\' 4"  (1.626 m)    Constitutional: Elderly appearing.  No distress. Appears well nourished.  Neurologic: CN intact. no focal findings. no sensory loss. Psychiatric: Mood and affect symmetric and appropriate. Eyes: No icterus. No conjunctival pallor. Ears, nose, throat: mucous membranes moist. Midline trachea.  Cardiac: regular rate and rhythm.  Respiratory: unlabored. Abdominal: soft, non-tender, non-distended.  Peripheral vascular:  Left great toe with nonhealing ulcer  No palpable pedal pulses.  2+ popliteal pulses bilaterally Extremity: No edema. No cyanosis. No pallor.  Skin: No gangrene. .  Lymphatic: No Stemmer's sign. No palpable lymphadenopathy.  PERTINENT LABORATORY AND RADIOLOGIC DATA  Most recent CBC CBC Latest Ref Rng & Units 11/02/2019 11/08/2018 09/13/2018  WBC 4.0 - 10.5 K/uL 7.8 9.7 7.8  Hemoglobin 12.0 - 15.0 g/dL 14.8 14.1 11.7(L)  Hematocrit 36.0 - 46.0 % 48.0(H) 45.5 38.8  Platelets 150 - 400 K/uL 170 187 199     Most recent CMP CMP Latest Ref Rng & Units 11/02/2019 11/08/2018 09/14/2018  Glucose 70 - 99 mg/dL 231(H) 186(H) 99  BUN 8 - 23 mg/dL 20 13 8   Creatinine 0.44 - 1.00 mg/dL 0.86 0.63 0.59  Sodium 135 - 145 mmol/L 136 136 139  Potassium 3.5 - 5.1 mmol/L 4.0 3.9 4.1  Chloride 98 - 111 mmol/L 101 98 102  CO2 22 - 32 mmol/L 24 27 30   Calcium 8.9 - 10.3 mg/dL 9.1 9.5 9.1  Total Protein 6.5 - 8.1 g/dL 7.0 - -  Total Bilirubin 0.3 - 1.2 mg/dL 0.6 - -  Alkaline Phos 38 - 126 U/L 98 - -  AST 15 - 41 U/L 22 - -  ALT 0 - 44 U/L 12 - -    Renal function CrCl cannot be calculated (Patient's most recent lab result is older than the maximum 21 days  allowed.).  Hgb A1c MFr Bld (%)  Date Value  11/08/2018 7.3 (H)    LDL Cholesterol  Date Value Ref Range Status  08/24/2018 103 (H) 0 - 99 mg/dL Final    Comment:           Total Cholesterol/HDL:CHD Risk Coronary Heart Disease Risk Table  Men   Women  1/2 Average Risk   3.4   3.3  Average Risk       5.0   4.4  2 X Average Risk   9.6   7.1  3 X Average Risk  23.4   11.0        Use the calculated Patient Ratio above and the CHD Risk Table to determine the patient's CHD Risk.        ATP III CLASSIFICATION (LDL):  <100     mg/dL   Optimal  100-129  mg/dL   Near or Above                    Optimal  130-159  mg/dL   Borderline  160-189  mg/dL   High  >190     mg/dL   Very High Performed at [facility] [address] [place], [place] [address]      Vascular Imaging: CLINICAL DATA:  Left foot ulcer X 1 year, left foot discoloration.  EXAM: NONINVASIVE PHYSIOLOGIC VASCULAR STUDY OF BILATERAL LOWER EXTREMITIES  TECHNIQUE: Evaluation of both lower extremities were performed at rest, including calculation of ankle-brachial indices with single level Doppler, pressure and pulse volume recording.  COMPARISON:  None.  FINDINGS: Right ABI:  Non calculable due to vascular noncompressibility  Left ABI:  Non calculable due to vascular noncompressibility  Right Lower Extremity: Multiphasic waveform in the posterior tibial artery, monophasic in dorsalis pedis. Great toe pressure 148 mmHg, toe brachial index 0.87.  Left Lower Extremity: Multiphasic waveform in the dorsalis pedis, monophasic in the posterior tibial artery distally. Great toe pressure 106 mmHg, toe brachial index 0.62  IMPRESSION: 1. Left lower extremity moderate arterial occlusive disease at rest. 2. Right lower extremity arterial occlusive disease. Great toe pressure is sufficient for wound healing.   Electronically Signed   By: Lucrezia Europe M.D.   On: 12/07/2020  13:51  Yevonne Aline. Stanford Breed, MD [facility] Phone Number: [phone] 12/21/2020 2:01 PM

## 2020-12-21 NOTE — Progress Notes (Signed)
[facility] [place]  ASSESSMENT / PLAN: Heidi Dominguez is a 87 y.o. female with atherosclerosis of native arteries of left lower extremity causing ulceration.  Patient counseled patients with chronic limb threatening ischemia have an annual risk of cardiovascular mortality of 25% and a annual amputation risk of 25%. Aggressive risk factor modification and intervention for limb salvage is indicated..  Recommend the following which can slow the progression of atherosclerosis and reduce the risk of major adverse cardiac / limb events:  Complete cessation from all tobacco products. Blood glucose control with goal A1c < 7%. Blood pressure control with goal blood pressure < 140/90 mmHg. Lipid reduction therapy with goal LDL-C <100 mg/dL (<70 if symptomatic from PAD).  Aspirin 81mg  PO QD.  Atorvastatin 40-80mg  PO QD (or other "high intensity" statin therapy).  Plan left lower extremity angiogram with possible intervention via right common femoral artery approach in cath lab 12/22/20.  I anticipate severe tibial disease.  CHIEF COMPLAINT: Nonhealing left great toe ulcer x1 year  HISTORY OF PRESENT ILLNESS: Heidi Dominguez is a 87 y.o. female referred to clinic for evaluation of a nonhealing left great toe ulcer.  Patient reports this is been present for about a year.  Dr. Cannon Kettle has continued meticulous local wound care of the foot.  Unfortunately, the ulcer has become more painful and is not healing prompting referral.  Patient is ambulatory about her house.  She does not walk far or fast enough to claudicate.  She does not have symptoms typical of ischemic rest pain.  Past Medical History:  Diagnosis Date  . Acute pancreatitis    hx of   . Arthritis    "qwhere" (07/04/2018)  . Chronic back pain    "all over" (07/04/2018)  . Chronic diastolic CHF (congestive heart failure) ([place])   . Chronic kidney disease    stage  3 chronic kidney disease   . Complication of  anesthesia    "I have a hard time waking up"  . Degenerative joint disease of shoulder region   . Dry eye syndrome   . Dyspnea   . Family history of adverse reaction to anesthesia    "daughter has the shakes when she wakes up"  . GERD (gastroesophageal reflux disease)   . Heart murmur   . High cholesterol   . History of hiatal hernia   . HOH (hard of hearing)   . Hypertension   . Impacted cerumen of both ears    hx of   . Muscle weakness (generalized)   . Osteoarthritis   . Overactive bladder   . Pancreatitis    hx of   . Phlebitis    "BLE"  . Rotator cuff tear    right   . Tear of right supraspinatus tendon   . Thrombocytopenia (HCC)    hx of   . Type II diabetes mellitus ([place])   . Urinary tract infection    hx of   . Xerosis cutis    hx of     Past Surgical History:  Procedure Laterality Date  . ABDOMINAL HYSTERECTOMY    . BACK SURGERY    . CATARACT EXTRACTION W/ INTRAOCULAR LENS  IMPLANT, BILATERAL Bilateral   . CHOLECYSTECTOMY  2016  . ERCP N/A 08/30/2018   Procedure: ENDOSCOPIC RETROGRADE CHOLANGIOPANCREATOGRAPHY (ERCP);  Surgeon: Clarene Essex, MD;  Location: [facility];  Service: Endoscopy;  Laterality: N/A;  . FIXATION KYPHOPLASTY    . INCISION AND DRAINAGE Right 11/08/2018  Procedure: INCISION AND DRAINAGE right shoulder, placement of antibiotic beads ;  Surgeon: [name], MD;  Location: [facility];  Service: Orthopedics;  Laterality: Right;  . JOINT REPLACEMENT    . LAPAROSCOPIC CHOLECYSTECTOMY SINGLE SITE WITH INTRAOPERATIVE CHOLANGIOGRAM N/A 04/11/2015   Procedure: LAPAROSCOPIC LYSIS OF ADHESIONS, LAPAROSCOPIC CHOLECYSTECTOMY WITH INTRAOPERATIVE CHOLANGIOGRAM;  Surgeon: Michael Boston, MD;  Location: [facility];  Service: General;  Laterality: N/A;  . PANCREATIC STENT PLACEMENT  08/30/2018   Procedure: PANCREATIC STENT PLACEMENT;  Surgeon: Clarene Essex, MD;  Location: [facility];  Service: Endoscopy;;  . REMOVAL OF STONES  08/30/2018   Procedure: REMOVAL OF  STONES;  Surgeon: Clarene Essex, MD;  Location: [facility];  Service: Endoscopy;;  . SHOULDER OPEN ROTATOR CUFF REPAIR Bilateral   . SPHINCTEROTOMY  08/30/2018   Procedure: SPHINCTEROTOMY;  Surgeon: Clarene Essex, MD;  Location: [facility];  Service: Endoscopy;;  . TOTAL KNEE ARTHROPLASTY Bilateral     Family History  Problem Relation Age of Onset  . Hypertension Mother   . Diabetes Mellitus I Mother   . Hypertension Father     Social History   Socioeconomic History  . Marital status: Widowed    Spouse name: Not on file  . Number of children: Not on file  . Years of education: Not on file  . Highest education level: Not on file  Occupational History  . Occupation: retired  Tobacco Use  . Smoking status: Never Smoker  . Smokeless tobacco: Never Used  Vaping Use  . Vaping Use: Never used  Substance and Sexual Activity  . Alcohol use: No    Comment: rare  . Drug use: No  . Sexual activity: Not on file  Other Topics Concern  . Not on file  Social History Narrative  . Not on file   Social Determinants of Health   Financial Resource Strain: Not on file  Food Insecurity: Not on file  Transportation Needs: Not on file  Physical Activity: Not on file  Stress: Not on file  Social Connections: Not on file  Intimate Partner Violence: Not on file    Allergies  Allergen Reactions  . Amoxicillin-Pot Clavulanate Diarrhea  . Catapres [Clonidine Hcl] Other (See Comments)    Unknown. Unknown.  . Codeine Other (See Comments)    "Makes me out of my head, loopy"  . Covera-Hs [Verapamil Hcl] Other (See Comments)    Unknown.  Marland Kitchen Hydrocodone Other (See Comments)    "makes me go out of my head, loopy"  . Other Other (See Comments)    Any type of narcotics Feels "loopy" Any type of narcotics Feels "loopy"  . Sulfa Antibiotics Itching  . Sulfamethoxazole Diarrhea  . Verapamil Diarrhea and Other (See Comments)  . Sulfasalazine Itching    Current Outpatient Medications   Medication Sig Dispense Refill  . albuterol (PROVENTIL HFA;VENTOLIN HFA) 108 (90 Base) MCG/ACT inhaler Inhale into the lungs every 6 (six) hours as needed for wheezing or shortness of breath.    Marland Kitchen amLODipine (NORVASC) 10 MG tablet TK 1 T PO QD    . aspirin EC 81 MG tablet Take 81 mg by mouth daily.    . budesonide-formoterol (SYMBICORT) 160-4.5 MCG/ACT inhaler 2 puffs    . Carboxymethylcellulose Sod PF (REFRESH PLUS) 0.5 % SOLN See admin instructions.    . Cholecalciferol (VITAMIN D-3) 125 MCG (5000 UT) TABS Take 5,000 Units by mouth daily.    . ciprofloxacin (CIPRO) 500 MG tablet SMARTSIG:1 Tablet(s) By Mouth Every 12 Hours    .  colchicine 0.6 MG tablet Take 1 tablet (0.6 mg total) by mouth daily. 14 tablet 0  . doxycycline (VIBRA-TABS) 100 MG tablet Take 1 tablet (100 mg total) by mouth 2 (two) times daily. 20 tablet 0  . erythromycin ophthalmic ointment erythromycin 5 mg/gram (0.5 %) eye ointment  APPLY A SMALL AMOUNT INTO THE RIGHT EYE FOUR TIMES DAILY AS NEEDED FOR PAIN    . famotidine (PEPCID) 20 MG tablet Take one after  supper 30 tablet 2  . fentaNYL (DURAGESIC) 25 MCG/HR APP 1 PA EXT TO THE SKIN Q 72 H    . Ferrous Gluconate (IRON) 240 (27 FE) MG TABS Take 240 mg by mouth daily.     . fluconazole (DIFLUCAN) 100 MG tablet fluconazole 100 mg tablet  TAKE 1 TABLET BY MOUTH EVERY DAY FOR 1 DAY    . furosemide (LASIX) 20 MG tablet furosemide 20 mg tablet    . hydrocortisone (ANUSOL-HC) 2.5 % rectal cream Apply topically 2 (two) times daily.    . hydrocortisone 2.5 % cream hydrocortisone 2.5 % topical cream with perineal applicator    . ibuprofen (ADVIL) 200 MG tablet 1 tablet with food or milk as needed    . JANUVIA 50 MG tablet Take 50 mg by mouth daily.   5  . levalbuterol (XOPENEX) 1.25 MG/3ML nebulizer solution 1 vial in neb every 4 hours as needed    . lidocaine (XYLOCAINE) 5 % ointment APPLY TOPICALLY AS NEEDED. TOES FOR PAIN 35.44 g 0  . loratadine (CLARITIN) 10 MG tablet 1  tablet    . losartan (COZAAR) 25 MG tablet Take 25 mg by mouth daily.    Marland Kitchen losartan (COZAAR) 50 MG tablet TAKE 1 TABLET(50 MG) BY MOUTH DAILY 30 tablet 0  . Magnesium 250 MG TABS Take by mouth.    . Multiple Vitamins-Minerals (PRESERVISION AREDS 2) CAPS     . mupirocin ointment (BACTROBAN) 2 % Apply topically daily.    . Omega 3 1000 MG CAPS 1 capsule    . ONETOUCH VERIO test strip USE TO CHECK BLOOD SUGAR ONCE DAILY.    . pantoprazole (PROTONIX) 40 MG tablet Take 1 tablet (40 mg total) by mouth daily. Take 30-60 min before first meal of the day 30 tablet 5  . potassium chloride (K-DUR) 10 MEQ tablet TK 1 T PO  ONCE A DAY    . SYSTANE COMPLETE 0.6 % SOLN     . UNABLE TO FIND     . UNABLE TO FIND albuterol sulfate HFA 90 mcg/actuation aerosol inhaler  TAKE 2 PUFFS AS NEEDED EVERY 4 HOURS    . vitamin B-12 (CYANOCOBALAMIN) 1000 MCG tablet Take 1,000 mcg by mouth daily.    . vitamin B-12 (CYANOCOBALAMIN) 500 MCG tablet      No current facility-administered medications for this visit.    REVIEW OF SYSTEMS:  [X]  denotes positive finding, [ ]  denotes negative finding Cardiac  Comments:  Chest pain or chest pressure:    Shortness of breath upon exertion:    Short of breath when lying flat:    Irregular heart rhythm:        Vascular    Pain in calf, thigh, or hip brought on by ambulation:    Pain in feet at night that wakes you up from your sleep:     Blood clot in your veins:    Leg swelling:         Pulmonary    Oxygen at home:  Productive cough:     Wheezing:         Neurologic    Sudden weakness in arms or legs:     Sudden numbness in arms or legs:     Sudden onset of difficulty speaking or slurred speech:    Temporary loss of vision in one eye:     Problems with dizziness:         Gastrointestinal    Blood in stool:     Vomited blood:         Genitourinary    Burning when urinating:     Blood in urine:        Psychiatric    Major depression:         Hematologic     Bleeding problems:    Problems with blood clotting too easily:        Skin    Rashes or ulcers:        Constitutional    Fever or chills:      PHYSICAL EXAM  Vitals:   12/21/20 1336  BP: 115/70  Pulse: 79  Resp: 20  Temp: 98.1 F (36.7 C)  SpO2: 98%  Weight: 165 lb (74.8 kg)  Height: 5\' 4"  (1.626 m)    Constitutional: Elderly appearing.  No distress. Appears well nourished.  Neurologic: CN intact. no focal findings. no sensory loss. Psychiatric: Mood and affect symmetric and appropriate. Eyes: No icterus. No conjunctival pallor. Ears, nose, throat: mucous membranes moist. Midline trachea.  Cardiac: regular rate and rhythm.  Respiratory: unlabored. Abdominal: soft, non-tender, non-distended.  Peripheral vascular:  Left great toe with nonhealing ulcer  No palpable pedal pulses.  2+ popliteal pulses bilaterally Extremity: No edema. No cyanosis. No pallor.  Skin: No gangrene. .  Lymphatic: No Stemmer's sign. No palpable lymphadenopathy.  PERTINENT LABORATORY AND RADIOLOGIC DATA  Most recent CBC CBC Latest Ref Rng & Units 11/02/2019 11/08/2018 09/13/2018  WBC 4.0 - 10.5 K/uL 7.8 9.7 7.8  Hemoglobin 12.0 - 15.0 g/dL 14.8 14.1 11.7(L)  Hematocrit 36.0 - 46.0 % 48.0(H) 45.5 38.8  Platelets 150 - 400 K/uL 170 187 199     Most recent CMP CMP Latest Ref Rng & Units 11/02/2019 11/08/2018 09/14/2018  Glucose 70 - 99 mg/dL 231(H) 186(H) 99  BUN 8 - 23 mg/dL 20 13 8   Creatinine 0.44 - 1.00 mg/dL 0.86 0.63 0.59  Sodium 135 - 145 mmol/L 136 136 139  Potassium 3.5 - 5.1 mmol/L 4.0 3.9 4.1  Chloride 98 - 111 mmol/L 101 98 102  CO2 22 - 32 mmol/L 24 27 30   Calcium 8.9 - 10.3 mg/dL 9.1 9.5 9.1  Total Protein 6.5 - 8.1 g/dL 7.0 - -  Total Bilirubin 0.3 - 1.2 mg/dL 0.6 - -  Alkaline Phos 38 - 126 U/L 98 - -  AST 15 - 41 U/L 22 - -  ALT 0 - 44 U/L 12 - -    Renal function CrCl cannot be calculated (Patient's most recent lab result is older than the maximum 21 days  allowed.).  Hgb A1c MFr Bld (%)  Date Value  11/08/2018 7.3 (H)    LDL Cholesterol  Date Value Ref Range Status  08/24/2018 103 (H) 0 - 99 mg/dL Final    Comment:           Total Cholesterol/HDL:CHD Risk Coronary Heart Disease Risk Table  Men   Women  1/2 Average Risk   3.4   3.3  Average Risk       5.0   4.4  2 X Average Risk   9.6   7.1  3 X Average Risk  23.4   11.0        Use the calculated Patient Ratio above and the CHD Risk Table to determine the patient's CHD Risk.        ATP III CLASSIFICATION (LDL):  <100     mg/dL   Optimal  100-129  mg/dL   Near or Above                    Optimal  130-159  mg/dL   Borderline  160-189  mg/dL   High  >190     mg/dL   Very High Performed at [facility] [address] [place], [place] [address]      Vascular Imaging: CLINICAL DATA:  Left foot ulcer X 1 year, left foot discoloration.  EXAM: NONINVASIVE PHYSIOLOGIC VASCULAR STUDY OF BILATERAL LOWER EXTREMITIES  TECHNIQUE: Evaluation of both lower extremities were performed at rest, including calculation of ankle-brachial indices with single level Doppler, pressure and pulse volume recording.  COMPARISON:  None.  FINDINGS: Right ABI:  Non calculable due to vascular noncompressibility  Left ABI:  Non calculable due to vascular noncompressibility  Right Lower Extremity: Multiphasic waveform in the posterior tibial artery, monophasic in dorsalis pedis. Great toe pressure 148 mmHg, toe brachial index 0.87.  Left Lower Extremity: Multiphasic waveform in the dorsalis pedis, monophasic in the posterior tibial artery distally. Great toe pressure 106 mmHg, toe brachial index 0.62  IMPRESSION: 1. Left lower extremity moderate arterial occlusive disease at rest. 2. Right lower extremity arterial occlusive disease. Great toe pressure is sufficient for wound healing.   Electronically Signed   By: Lucrezia Europe M.D.   On: 12/07/2020  13:51  Yevonne Aline. Stanford Breed, MD [facility] Phone Number: [phone] 12/21/2020 2:01 PM

## 2020-12-22 ENCOUNTER — Observation Stay (HOSPITAL_COMMUNITY)
Admission: RE | Admit: 2020-12-22 | Discharge: 2020-12-23 | Disposition: A | Discharge status: Home / Self Care | Payer: Medicare Other | Attending: Vascular Surgery | Admitting: Vascular Surgery

## 2020-12-22 ENCOUNTER — Encounter (HOSPITAL_COMMUNITY)
Admission: RE | Disposition: A | Discharge status: Home / Self Care | Payer: Self-pay | Source: Home / Self Care | Attending: Vascular Surgery

## 2020-12-22 DIAGNOSIS — E113293 Type 2 diabetes mellitus with mild nonproliferative diabetic retinopathy without macular edema, bilateral: Secondary | ICD-10-CM | POA: Diagnosis not present

## 2020-12-22 DIAGNOSIS — I70248 Atherosclerosis of native arteries of left leg with ulceration of other part of lower left leg: Principal | ICD-10-CM | POA: Insufficient documentation

## 2020-12-22 DIAGNOSIS — I700 Atherosclerosis of aorta: Secondary | ICD-10-CM | POA: Diagnosis not present

## 2020-12-22 DIAGNOSIS — N183 Chronic kidney disease, stage 3 unspecified: Secondary | ICD-10-CM | POA: Insufficient documentation

## 2020-12-22 DIAGNOSIS — Z7982 Long term (current) use of aspirin: Secondary | ICD-10-CM | POA: Insufficient documentation

## 2020-12-22 DIAGNOSIS — K219 Gastro-esophageal reflux disease without esophagitis: Secondary | ICD-10-CM | POA: Diagnosis not present

## 2020-12-22 DIAGNOSIS — Z96653 Presence of artificial knee joint, bilateral: Secondary | ICD-10-CM | POA: Insufficient documentation

## 2020-12-22 DIAGNOSIS — I130 Hypertensive heart and chronic kidney disease with heart failure and stage 1 through stage 4 chronic kidney disease, or unspecified chronic kidney disease: Secondary | ICD-10-CM | POA: Diagnosis not present

## 2020-12-22 DIAGNOSIS — L97529 Non-pressure chronic ulcer of other part of left foot with unspecified severity: Secondary | ICD-10-CM | POA: Insufficient documentation

## 2020-12-22 DIAGNOSIS — I739 Peripheral vascular disease, unspecified: Secondary | ICD-10-CM | POA: Diagnosis present

## 2020-12-22 DIAGNOSIS — Z9181 History of falling: Secondary | ICD-10-CM | POA: Diagnosis not present

## 2020-12-22 DIAGNOSIS — E1140 Type 2 diabetes mellitus with diabetic neuropathy, unspecified: Secondary | ICD-10-CM | POA: Diagnosis not present

## 2020-12-22 DIAGNOSIS — Z20822 Contact with and (suspected) exposure to covid-19: Secondary | ICD-10-CM | POA: Insufficient documentation

## 2020-12-22 DIAGNOSIS — I110 Hypertensive heart disease with heart failure: Secondary | ICD-10-CM | POA: Diagnosis not present

## 2020-12-22 DIAGNOSIS — Z79899 Other long term (current) drug therapy: Secondary | ICD-10-CM | POA: Insufficient documentation

## 2020-12-22 DIAGNOSIS — Z741 Need for assistance with personal care: Secondary | ICD-10-CM | POA: Diagnosis not present

## 2020-12-22 DIAGNOSIS — I340 Nonrheumatic mitral (valve) insufficiency: Secondary | ICD-10-CM | POA: Diagnosis not present

## 2020-12-22 DIAGNOSIS — I5032 Chronic diastolic (congestive) heart failure: Secondary | ICD-10-CM | POA: Diagnosis not present

## 2020-12-22 DIAGNOSIS — R269 Unspecified abnormalities of gait and mobility: Secondary | ICD-10-CM | POA: Diagnosis not present

## 2020-12-22 DIAGNOSIS — E119 Type 2 diabetes mellitus without complications: Secondary | ICD-10-CM | POA: Diagnosis not present

## 2020-12-22 DIAGNOSIS — E11621 Type 2 diabetes mellitus with foot ulcer: Secondary | ICD-10-CM | POA: Diagnosis not present

## 2020-12-22 DIAGNOSIS — I7025 Atherosclerosis of native arteries of other extremities with ulceration: Secondary | ICD-10-CM | POA: Diagnosis present

## 2020-12-22 DIAGNOSIS — Z7984 Long term (current) use of oral hypoglycemic drugs: Secondary | ICD-10-CM | POA: Diagnosis not present

## 2020-12-22 DIAGNOSIS — I70245 Atherosclerosis of native arteries of left leg with ulceration of other part of foot: Secondary | ICD-10-CM

## 2020-12-22 DIAGNOSIS — D696 Thrombocytopenia, unspecified: Secondary | ICD-10-CM | POA: Diagnosis not present

## 2020-12-22 HISTORY — PX: PERIPHERAL VASCULAR INTERVENTION: CATH118257

## 2020-12-22 HISTORY — PX: ABDOMINAL AORTOGRAM W/LOWER EXTREMITY: CATH118223

## 2020-12-22 LAB — RESP PANEL BY RT-PCR (FLU A&B, COVID) ARPGX2
Influenza A by PCR: NEGATIVE
Influenza B by PCR: NEGATIVE
SARS Coronavirus 2 by RT PCR: NEGATIVE

## 2020-12-22 LAB — CBC
HCT: 44.8 % (ref 36.0–46.0)
Hemoglobin: 14.2 g/dL (ref 12.0–15.0)
MCH: 28.8 pg (ref 26.0–34.0)
MCHC: 31.7 g/dL (ref 30.0–36.0)
MCV: 90.9 fL (ref 80.0–100.0)
Platelets: 208 10*3/uL (ref 150–400)
RBC: 4.93 MIL/uL (ref 3.87–5.11)
RDW: 13.7 % (ref 11.5–15.5)
WBC: 8.9 10*3/uL (ref 4.0–10.5)
nRBC: 0 % (ref 0.0–0.2)

## 2020-12-22 LAB — POCT I-STAT, CHEM 8
BUN: 16 mg/dL (ref 8–23)
Calcium, Ion: 1.35 mmol/L (ref 1.15–1.40)
Chloride: 98 mmol/L (ref 98–111)
Creatinine, Ser: 0.9 mg/dL (ref 0.44–1.00)
Glucose, Bld: 135 mg/dL — ABNORMAL HIGH (ref 70–99)
HCT: 49 % — ABNORMAL HIGH (ref 36.0–46.0)
Hemoglobin: 16.7 g/dL — ABNORMAL HIGH (ref 12.0–15.0)
Potassium: 3.9 mmol/L (ref 3.5–5.1)
Sodium: 140 mmol/L (ref 135–145)
TCO2: 31 mmol/L (ref 22–32)

## 2020-12-22 LAB — CREATININE, SERUM
Creatinine, Ser: 0.74 mg/dL (ref 0.44–1.00)
GFR, Estimated: >60 mL/min (ref >60)

## 2020-12-22 LAB — POCT ACTIVATED CLOTTING TIME: Activated Clotting Time: 297 seconds

## 2020-12-22 LAB — GLUCOSE, CAPILLARY
Glucose-Capillary: 125 mg/dL — ABNORMAL HIGH (ref 70–99)
Glucose-Capillary: 161 mg/dL — ABNORMAL HIGH (ref 70–99)

## 2020-12-22 SURGERY — ABDOMINAL AORTOGRAM W/LOWER EXTREMITY
Anesthesia: LOCAL

## 2020-12-22 MED ORDER — SODIUM CHLORIDE 0.9 % WEIGHT BASED INFUSION
1.0000 mL/kg/h | INTRAVENOUS | Status: AC
  Administered 2020-12-22: 1 mL/kg/h via INTRAVENOUS

## 2020-12-22 MED ORDER — CLOPIDOGREL BISULFATE 75 MG PO TABS
75.0000 mg | ORAL_TABLET | Freq: Every day | ORAL | Status: DC
  Administered 2020-12-23: 75 mg via ORAL
  Filled 2020-12-22: qty 1

## 2020-12-22 MED ORDER — CLOPIDOGREL BISULFATE 75 MG PO TABS: 300.0000 mg | ORAL_TABLET | Freq: Once | ORAL | Status: DC

## 2020-12-22 MED ORDER — ASPIRIN 81 MG PO CHEW
CHEWABLE_TABLET | ORAL | Status: AC
  Filled 2020-12-22: qty 1

## 2020-12-22 MED ORDER — ASPIRIN EC 81 MG PO TBEC
81.0000 mg | DELAYED_RELEASE_TABLET | Freq: Every day | ORAL | Status: DC
  Administered 2020-12-23: 81 mg via ORAL
  Filled 2020-12-22: qty 1

## 2020-12-22 MED ORDER — HEPARIN (PORCINE) IN NACL 1000-0.9 UT/500ML-% IV SOLN
INTRAVENOUS | Status: DC | PRN
  Administered 2020-12-22 (×2): 500 mL

## 2020-12-22 MED ORDER — SODIUM CHLORIDE 0.9 % IV SOLN: 250.0000 mL | INTRAVENOUS | Status: DC | PRN

## 2020-12-22 MED ORDER — HEPARIN (PORCINE) IN NACL 1000-0.9 UT/500ML-% IV SOLN
INTRAVENOUS | Status: AC
  Filled 2020-12-22: qty 1000

## 2020-12-22 MED ORDER — SODIUM CHLORIDE 0.9% FLUSH: 3.0000 mL | INTRAVENOUS | Status: DC | PRN

## 2020-12-22 MED ORDER — LIDOCAINE HCL (PF) 1 % IJ SOLN
INTRAMUSCULAR | Status: AC
  Filled 2020-12-22: qty 30

## 2020-12-22 MED ORDER — HEPARIN SODIUM (PORCINE) 5000 UNIT/ML IJ SOLN
5000.0000 [IU] | Freq: Three times a day (TID) | INTRAMUSCULAR | Status: DC
  Administered 2020-12-22 – 2020-12-23 (×2): 5000 [IU] via SUBCUTANEOUS
  Filled 2020-12-22 (×2): qty 1

## 2020-12-22 MED ORDER — LIDOCAINE HCL (PF) 1 % IJ SOLN
INTRAMUSCULAR | Status: DC | PRN
  Administered 2020-12-22: 15 mL

## 2020-12-22 MED ORDER — HYDRALAZINE HCL 20 MG/ML IJ SOLN: 5.0000 mg | INTRAMUSCULAR | Status: DC | PRN

## 2020-12-22 MED ORDER — CLOPIDOGREL BISULFATE 75 MG PO TABS: 75.0000 mg | ORAL_TABLET | Freq: Every day | ORAL | Status: DC

## 2020-12-22 MED ORDER — LABETALOL HCL 5 MG/ML IV SOLN: 10.0000 mg | INTRAVENOUS | Status: DC | PRN

## 2020-12-22 MED ORDER — HEPARIN SODIUM (PORCINE) 1000 UNIT/ML IJ SOLN
INTRAMUSCULAR | Status: DC | PRN
  Administered 2020-12-22: 7000 [IU] via INTRAVENOUS

## 2020-12-22 MED ORDER — IODIXANOL 320 MG/ML IV SOLN
INTRAVENOUS | Status: DC | PRN
  Administered 2020-12-22: 100 mL via INTRA_ARTERIAL

## 2020-12-22 MED ORDER — ONDANSETRON HCL 4 MG/2ML IJ SOLN: 4.0000 mg | Freq: Four times a day (QID) | INTRAMUSCULAR | Status: DC | PRN

## 2020-12-22 MED ORDER — PROTAMINE SULFATE 10 MG/ML IV SOLN
INTRAVENOUS | Status: DC | PRN
  Administered 2020-12-22: 30 mg via INTRAVENOUS

## 2020-12-22 MED ORDER — SODIUM CHLORIDE 0.9% FLUSH
3.0000 mL | Freq: Two times a day (BID) | INTRAVENOUS | Status: DC
  Administered 2020-12-23: 3 mL via INTRAVENOUS

## 2020-12-22 MED ORDER — CLOPIDOGREL BISULFATE 300 MG PO TABS
ORAL_TABLET | ORAL | Status: DC | PRN
  Administered 2020-12-22: 300 mg via ORAL

## 2020-12-22 MED ORDER — CLOPIDOGREL BISULFATE 300 MG PO TABS
ORAL_TABLET | ORAL | Status: AC
  Filled 2020-12-22: qty 1

## 2020-12-22 MED ORDER — ASPIRIN 81 MG PO CHEW
CHEWABLE_TABLET | ORAL | Status: DC | PRN
  Administered 2020-12-22: 81 mg via ORAL

## 2020-12-22 MED ORDER — SODIUM CHLORIDE 0.9 % IV SOLN: INTRAVENOUS | Status: DC

## 2020-12-22 MED ORDER — HEPARIN SODIUM (PORCINE) 1000 UNIT/ML IJ SOLN
INTRAMUSCULAR | Status: AC
  Filled 2020-12-22: qty 1

## 2020-12-22 MED ORDER — PROTAMINE SULFATE 10 MG/ML IV SOLN
INTRAVENOUS | Status: AC
  Filled 2020-12-22: qty 5

## 2020-12-22 MED ORDER — ACETAMINOPHEN 325 MG PO TABS: 650.0000 mg | ORAL_TABLET | ORAL | Status: DC | PRN

## 2020-12-22 SURGICAL SUPPLY — 19 items
BALLN STERLING OTW 3X220X150 (BALLOONS) ×3
BALLOON STERLING OTW 3X220X150 (BALLOONS) IMPLANT
CATH CXI SUPP 2.6F 150 ANG (CATHETERS) ×1 IMPLANT
CATH OMNI FLUSH 5F 65CM (CATHETERS) ×1 IMPLANT
DEVICE CLOSURE MYNXGRIP 5F (Vascular Products) ×1 IMPLANT
GLIDEWIRE ADV .035X260CM (WIRE) ×1 IMPLANT
KIT ENCORE 26 ADVANTAGE (KITS) ×1 IMPLANT
KIT MICROPUNCTURE NIT STIFF (SHEATH) ×1 IMPLANT
KIT PV (KITS) ×3 IMPLANT
SHEATH PINNACLE 5F 10CM (SHEATH) ×1 IMPLANT
SHEATH PROBE COVER 6X72 (BAG) ×1 IMPLANT
SHEATH SHUTTLE 5F/110 (SHEATH) ×1 IMPLANT
STENT SYNERGY XD 4.0X16 (Permanent Stent) IMPLANT
SYNERGY XD 4.0X16 (Permanent Stent) ×3 IMPLANT
SYR MEDRAD MARK 7 150ML (SYRINGE) ×3 IMPLANT
TRANSDUCER W/STOPCOCK (MISCELLANEOUS) ×3 IMPLANT
TRAY PV CATH (CUSTOM PROCEDURE TRAY) ×3 IMPLANT
WIRE G V18X300CM (WIRE) ×1 IMPLANT
WIRE SPARTACORE .014X300CM (WIRE) ×1 IMPLANT

## 2020-12-22 NOTE — Interval H&P Note (Signed)
History and Physical Interval Note:  12/22/2020 3:44 PM  Heidi Dominguez  has presented today for surgery, with the diagnosis of pad.  The various methods of treatment have been discussed with the patient and family. After consideration of risks, benefits and other options for treatment, the patient has consented to  Procedure(s): ABDOMINAL AORTOGRAM W/LOWER EXTREMITY (N/A) as a surgical intervention.  The patient's history has been reviewed, patient examined, no change in status, stable for surgery.  I have reviewed the patient's chart and labs.  Questions were answered to the patient's satisfaction.     Cherre Robins

## 2020-12-22 NOTE — Op Note (Signed)
DATE OF SERVICE: 12/22/2020  PATIENT:  Heidi Dominguez  87 y.o. female  PRE-OPERATIVE DIAGNOSIS:  Atherosclerosis of native arteries of left lower extremity lower extremity causing great toe ulceration  POST-OPERATIVE DIAGNOSIS:  Same  PROCEDURE:   1) US guided right common femoral artery access 2) left lower extremity angiogram with third order cannulation  3) left anterior tibial artery stenting (4x73mm) and angioplasty (3x273mm)  SURGEON:  Surgeon(s) and Role:    * Cherre Robins, MD - Primary  ASSISTANT: none  ANESTHESIA:   local  EBL: min  BLOOD ADMINISTERED:none  DRAINS: none   LOCAL MEDICATIONS USED:  LIDOCAINE   SPECIMEN:  none  COUNTS: confirmed correct.  TOURNIQUET:  None  PATIENT DISPOSITION:  PACU - hemodynamically stable.   Delay start of Pharmacological VTE agent (>24hrs) due to surgical blood loss or risk of bleeding: no  INDICATION FOR PROCEDURE: Heidi Dominguez is a 87 y.o. female with non-healing left great toe ulcer and non-invasive vascular study evidence of tibial disease. After careful discussion of risks, benefits, and alternatives the patient was offered angiography. We specifically discussed access site complications. The patient understood and wished to proceed.  OPERATIVE FINDINGS:  Unremarkable left lower extremity angiogram above the knee Two vessel runoff to the foot (peroneal dominant, AT diffusely diseased from origin to ankle) AT with no residual stenosis after angioplasty, flow limiting dissection near origin.  Dissection resolved with coronary stent placement. Palpable DP pulse at completion.  DESCRIPTION OF PROCEDURE: After identification of the patient in the pre-operative holding area, the patient was transferred to the operating room. The patient was positioned supine on the operating room table. Anesthesia was induced. The groins was prepped and draped in standard fashion. A surgical pause was performed confirming correct  patient, procedure, and operative location.  The right groin was anesthetized with subcutaneous injection of 1% lidocaine. Using ultrasound guidance, the right common femoral artery was accessed with micropuncture technique. Fluoroscopy was used to confirm cannulation over the femoral head. Sheathogram was not performed. The 40F sheath was upsized to 475F.   The left common iliac artery was selected with the 035 glidewire advantage. The wire was advanced into the common femoral artery. Over the wire the omni flush catheter was advanced into the external iliac artery. Selective angiography was performed - see above for details.   The decision was made to intervene. The patient was heparinized with 7000 units of heparin. The sheath was exchanged for a 475F x 110cm sheath. Selective angiography of the left lower extremity was performed prior to intervention. The lesions were treated with angioplasty first (3x262mm). A flow limiting dissection was noted near the origin of the anterior tibial artery. A 4x15mm drug eluting coronary stent was deployed with resolution of the stenosis.   Completion angiography revealed:  Resolution of anterior tibial artery stenosis  A mynx device was used to close the arteriotomy. Hemostasis was excellent upon completion.  Upon completion of the case instrument and sharps counts were confirmed correct. The patient was transferred to the PACU in good condition. I was present for all portions of the procedure.  PLAN: Admit for observation. ASA / Plavix x 12 months. Continue statin indefinitely. Home tomorrow.   Heidi Dominguez. Stanford Breed, MD [facility] Phone Number: [phone] 12/22/2020 6:00 PM

## 2020-12-23 ENCOUNTER — Encounter (HOSPITAL_BASED_OUTPATIENT_CLINIC_OR_DEPARTMENT_OTHER): Payer: Medicare Other | Admitting: Internal Medicine

## 2020-12-23 ENCOUNTER — Other Ambulatory Visit: Payer: Self-pay

## 2020-12-23 ENCOUNTER — Encounter (HOSPITAL_COMMUNITY): Payer: Self-pay | Admitting: Vascular Surgery

## 2020-12-23 DIAGNOSIS — I7025 Atherosclerosis of native arteries of other extremities with ulceration: Secondary | ICD-10-CM

## 2020-12-23 DIAGNOSIS — I130 Hypertensive heart and chronic kidney disease with heart failure and stage 1 through stage 4 chronic kidney disease, or unspecified chronic kidney disease: Secondary | ICD-10-CM | POA: Diagnosis not present

## 2020-12-23 DIAGNOSIS — I70248 Atherosclerosis of native arteries of left leg with ulceration of other part of lower left leg: Secondary | ICD-10-CM | POA: Diagnosis not present

## 2020-12-23 DIAGNOSIS — N183 Chronic kidney disease, stage 3 unspecified: Secondary | ICD-10-CM | POA: Diagnosis not present

## 2020-12-23 DIAGNOSIS — Z20822 Contact with and (suspected) exposure to covid-19: Secondary | ICD-10-CM | POA: Diagnosis not present

## 2020-12-23 DIAGNOSIS — E119 Type 2 diabetes mellitus without complications: Secondary | ICD-10-CM | POA: Diagnosis not present

## 2020-12-23 DIAGNOSIS — I5032 Chronic diastolic (congestive) heart failure: Secondary | ICD-10-CM | POA: Diagnosis not present

## 2020-12-23 MED ORDER — CLOPIDOGREL BISULFATE 75 MG PO TABS: 75.0000 mg | ORAL_TABLET | Freq: Every day | ORAL | 3 refills | Status: DC

## 2020-12-23 NOTE — Care Management Important Message (Signed)
Important Message  Patient Details  Name: Heidi Dominguez MRN: [id number] Date of Birth: 09-25-1920   Medicare Important Message Given:  Yes     Orbie Pyo 12/23/2020, 2:46 PM

## 2020-12-23 NOTE — Plan of Care (Signed)

## 2020-12-23 NOTE — Discharge Summary (Signed)
Discharge Summary  Patient ID: Heidi Dominguez [id number] 87 y.o. Dec 03, 1920  Admit date: 12/22/2020  Discharge date and time: 12/23/20  Admitting Physician: Cherre Robins, MD   Discharge Physician: same  Admission Diagnoses: Atherosclerosis of native arteries of the extremities with ulceration [facility]) [I70.25]  Discharge Diagnoses: same  Admission Condition: fair  Discharged Condition: fair  Indication for Admission: Observation s/p aortogram  Hospital Course: Heidi Dominguez is a 87 year old female who was brought in as an outpatient for angiography due to left great toe ulceration.  She underwent aortogram with left anterior tibial artery angioplasty and stenting by Dr. Stanford Breed on 12/22/2020.  She tolerated this procedure well and was kept in observation overnight.  POD #1 she maintained a palpable left anterior tibial artery pulse.  The right groin catheterization site is soft without hematoma.  The patient is ready for discharge home.  She will be prescribed 12 months of Plavix to take in addition to aspirin.  She will follow-up in 4 to 6 weeks with a arterial duplex of left lower extremity as well as ABI.  She will be discharged home in stable condition.  Consults: None  Treatments: surgery: Aortogram with L ATA angioplasty and stenting  Discharge Exam: See progress note 12/23/20 Vitals:   12/23/20 0358 12/23/20 0950  BP: 122/67 (!) 157/72  Pulse: 73 92  Resp: 18 18  Temp: 98.4 F (36.9 C) 98.6 F (37 C)  SpO2: 93% 99%     Disposition: Discharge disposition: 01-Home or Self Care       Patient Instructions:  Allergies as of 12/23/2020      Reactions   Amoxicillin-pot Clavulanate Diarrhea   Catapres [clonidine Hcl] Other (See Comments)   Unknown.   Codeine Other (See Comments)   "Makes me out of my head, loopy"   Hydrocodone Other (See Comments)   "makes me go out of my head, loopy"   Lisinopril Cough   Other Other (See Comments)   Any type of  narcotics Feels "loopy"   Sulfa Antibiotics Itching   Sulfamethoxazole Diarrhea   Verapamil Diarrhea, Other (See Comments)   Sulfasalazine Itching      Medication List    TAKE these medications   acetaminophen 650 MG CR tablet Commonly known as: TYLENOL Take 1,300 mg by mouth every 8 (eight) hours as needed for pain.   albuterol 108 (90 Base) MCG/ACT inhaler Commonly known as: VENTOLIN HFA Inhale 2 puffs into the lungs every 6 (six) hours as needed for wheezing or shortness of breath.   amLODipine 10 MG tablet Commonly known as: NORVASC Take 10 mg by mouth daily.   aspirin EC 81 MG tablet Take 81 mg by mouth daily.   cetirizine 10 MG tablet Commonly known as: ZYRTEC Take 10 mg by mouth at bedtime.   ciprofloxacin 500 MG tablet Commonly known as: CIPRO Take 500 mg by mouth every 12 (twelve) hours.   clopidogrel 75 MG tablet Commonly known as: PLAVIX Take 1 tablet (75 mg total) by mouth daily. Start taking on: Dec 24, 2020   colchicine 0.6 MG tablet Take 1 tablet (0.6 mg total) by mouth daily.   doxycycline 100 MG tablet Commonly known as: VIBRA-TABS Take 1 tablet (100 mg total) by mouth 2 (two) times daily.   famotidine 20 MG tablet Commonly known as: PEPCID Take one after  supper   fentaNYL 25 MCG/HR Commonly known as: Sabula 1 patch onto the skin every 3 (three) days.   ferrous sulfate 325 (65  FE) MG tablet Take 325 mg by mouth at bedtime.   FIBRACOL EX Apply 1 patch topically every other day.   PROMOGRAN EX Apply 1 patch topically every other day.   furosemide 20 MG tablet Commonly known as: LASIX Take 20 mg by mouth daily.   IMMUNE SUPPORT PO Take 1 tablet by mouth daily.   Januvia 50 MG tablet Generic drug: sitaGLIPtin Take 50 mg by mouth daily.   levalbuterol 1.25 MG/3ML nebulizer solution Commonly known as: XOPENEX Take 1.25 mg by nebulization every 4 (four) hours as needed for shortness of breath or wheezing.   lidocaine 5  % ointment Commonly known as: XYLOCAINE APPLY TOPICALLY AS NEEDED. TOES FOR PAIN   losartan 50 MG tablet Commonly known as: COZAAR TAKE 1 TABLET(50 MG) BY MOUTH DAILY   losartan 25 MG tablet Commonly known as: COZAAR Take 25 mg by mouth daily.   multivitamin with minerals Tabs tablet Take 1 tablet by mouth daily.   OneTouch Verio test strip Generic drug: glucose blood USE TO CHECK BLOOD SUGAR ONCE DAILY.   pantoprazole 40 MG tablet Commonly known as: Protonix Take 1 tablet (40 mg total) by mouth daily. Take 30-60 min before first meal of the day What changed:   when to take this  additional instructions   potassium chloride 10 MEQ tablet Commonly known as: KLOR-CON Take 10 mEq by mouth daily.   Refresh Plus 0.5 % Soln Generic drug: Carboxymethylcellulose Sod PF Place 1 drop into both eyes daily as needed (eye injections).   Systane Complete 0.6 % Soln Generic drug: Propylene Glycol Place 2 drops into both eyes 2 (two) times daily.   vitamin B-12 1000 MCG tablet Commonly known as: CYANOCOBALAMIN Take 1,000 mcg by mouth every Sunday.   Vitamin D-3 125 MCG (5000 UT) Tabs Take 5,000 Units by mouth every Monday.      Activity: activity as tolerated Diet: regular diet Wound Care: keep wound clean and dry  Follow-up with [facility] in 4 weeks.  Signed: Dagoberto Ligas, PA-C 12/23/2020 10:53 AM [facility] Office: [phone]

## 2020-12-23 NOTE — Discharge Instructions (Signed)
° °  [facility] [place] ° °Discharge Instructions ° °Lower Extremity Angiogram; Angioplasty/Stenting ° °Please refer to the following instructions for your post-procedure care. Your surgeon or physician assistant will discuss any changes with you. ° °Activity ° °Avoid lifting more than 8 pounds (1 gallons of milk) for 72 hours (3 days) after your procedure. You may walk as much as you can tolerate. It's OK to drive after 72 hours. ° °Bathing/Showering ° °You may shower the day after your procedure. If you have a bandage, you may remove it at 24- 48 hours. Clean your incision site with mild soap and water. Pat the area dry with a clean towel. ° °Diet ° °Resume your pre-procedure diet. There are no special food restrictions following this procedure. All patients with peripheral vascular disease should follow a low fat/low cholesterol diet. In order to heal from your surgery, it is CRITICAL to get adequate nutrition. Your body requires vitamins, minerals, and protein. Vegetables are the best source of vitamins and minerals. Vegetables also provide the perfect balance of protein. Processed food has little nutritional value, so try to avoid this. ° °Medications ° °Resume taking all of your medications unless your doctor tells you not to. If your incision is causing pain, you may take over-the-counter pain relievers such as acetaminophen (Tylenol) ° °Follow Up ° °Follow up will be arranged at the time of your procedure. You may have an office visit scheduled or may be scheduled for surgery. Ask your surgeon if you have any questions. ° °Please call us immediately for any of the following conditions: °•Severe or worsening pain your legs or feet at rest or with walking. °•Increased pain, redness, drainage at your groin puncture site. °•Fever of 101 degrees or higher. °•If you have any mild or slow bleeding from your puncture site: lie down, apply firm constant pressure over the area with a piece of  gauze or a clean wash cloth for 30 minutes- no peeking!, call 911 right away if you are still bleeding after 30 minutes, or if the bleeding is heavy and unmanageable. ° °Reduce your risk factors of vascular disease: ° °Stop smoking. If you would like help call [facility] at 1-800-QUIT-NOW (1-800-784-8669) or [place] at 336-586-4000. °Manage your cholesterol °Maintain a desired weight °Control your diabetes °Keep your blood pressure down ° °If you have any questions, please call the office at [phone] ° °

## 2020-12-23 NOTE — Progress Notes (Addendum)
  Progress Note    12/23/2020 7:38 AM 1 Day Post-Op  Subjective: Ready for discharge home   Vitals:   12/22/20 2346 12/23/20 0358  BP: (!) 101/55 122/67  Pulse: 73 73  Resp: 15 18  Temp: 98.3 F (36.8 C) 98.4 F (36.9 C)  SpO2: 93% 93%   Physical Exam Lungs:  Non labored Extremities:  L GT ulceration; palpable L ATA Neurologic: A&O  CBC    Component Value Date/Time   WBC 8.9 12/22/2020 2020   RBC 4.93 12/22/2020 2020   HGB 14.2 12/22/2020 2020   HCT 44.8 12/22/2020 2020   PLT 208 12/22/2020 2020   MCV 90.9 12/22/2020 2020   MCH 28.8 12/22/2020 2020   MCHC 31.7 12/22/2020 2020   RDW 13.7 12/22/2020 2020   LYMPHSABS 1.6 11/02/2019 2013   MONOABS 0.8 11/02/2019 2013   EOSABS 0.2 11/02/2019 2013   BASOSABS 0.0 11/02/2019 2013    BMET    Component Value Date/Time   NA 140 12/22/2020 1211   K 3.9 12/22/2020 1211   CL 98 12/22/2020 1211   CO2 24 11/02/2019 2013   GLUCOSE 135 (H) 12/22/2020 1211   BUN 16 12/22/2020 1211   CREATININE 0.74 12/22/2020 2020   CALCIUM 9.1 11/02/2019 2013   GFRNONAA >60 12/22/2020 2020   GFRAA >60 11/02/2019 2013    INR    Component Value Date/Time   INR 0.98 08/07/2012 0605     Intake/Output Summary (Last 24 hours) at 12/23/2020 0738 Last data filed at 12/22/2020 2300 Gross per 24 hour  Intake 120 ml  Output --  Net 120 ml     Assessment/Plan:  87 y.o. female is s/p L ATA angioplasty 1 Day Post-Op   L foot well perfused with palpable ATA pulse R groin cath site without hematoma Ok for discharge home today Patient will be provided 45months worth of plavix refills Follow up in 4-6 weeks with arterial duplex and ABI   Dagoberto Ligas, PA-C [facility] [phone] 12/23/2020 7:38 AM  VASCULAR STAFF ADDENDUM: I have independently interviewed and examined the patient. I agree with the above.  Stable for discharge after successful L AT revascularization yesterday. Continue DAPT. Continue statin  therapy. Follow up with me or PA in 4 weeks with ABI and arterial duplex.  Yevonne Aline. Stanford Breed, MD [facility] Phone Number: [phone] 12/23/2020 10:33 AM

## 2020-12-23 NOTE — Plan of Care (Signed)

## 2020-12-24 DIAGNOSIS — L97529 Non-pressure chronic ulcer of other part of left foot with unspecified severity: Secondary | ICD-10-CM | POA: Diagnosis not present

## 2020-12-24 DIAGNOSIS — I5032 Chronic diastolic (congestive) heart failure: Secondary | ICD-10-CM | POA: Diagnosis not present

## 2020-12-24 DIAGNOSIS — I110 Hypertensive heart disease with heart failure: Secondary | ICD-10-CM | POA: Diagnosis not present

## 2020-12-24 DIAGNOSIS — E1140 Type 2 diabetes mellitus with diabetic neuropathy, unspecified: Secondary | ICD-10-CM | POA: Diagnosis not present

## 2020-12-24 DIAGNOSIS — E11621 Type 2 diabetes mellitus with foot ulcer: Secondary | ICD-10-CM | POA: Diagnosis not present

## 2020-12-24 DIAGNOSIS — E113293 Type 2 diabetes mellitus with mild nonproliferative diabetic retinopathy without macular edema, bilateral: Secondary | ICD-10-CM | POA: Diagnosis not present

## 2020-12-27 DIAGNOSIS — E11621 Type 2 diabetes mellitus with foot ulcer: Secondary | ICD-10-CM | POA: Diagnosis not present

## 2020-12-27 DIAGNOSIS — E113293 Type 2 diabetes mellitus with mild nonproliferative diabetic retinopathy without macular edema, bilateral: Secondary | ICD-10-CM | POA: Diagnosis not present

## 2020-12-27 DIAGNOSIS — L97529 Non-pressure chronic ulcer of other part of left foot with unspecified severity: Secondary | ICD-10-CM | POA: Diagnosis not present

## 2020-12-27 DIAGNOSIS — I110 Hypertensive heart disease with heart failure: Secondary | ICD-10-CM | POA: Diagnosis not present

## 2020-12-27 DIAGNOSIS — E1140 Type 2 diabetes mellitus with diabetic neuropathy, unspecified: Secondary | ICD-10-CM | POA: Diagnosis not present

## 2020-12-27 DIAGNOSIS — I5032 Chronic diastolic (congestive) heart failure: Secondary | ICD-10-CM | POA: Diagnosis not present

## 2020-12-29 ENCOUNTER — Encounter (HOSPITAL_COMMUNITY): Payer: Self-pay

## 2020-12-29 ENCOUNTER — Encounter (HOSPITAL_BASED_OUTPATIENT_CLINIC_OR_DEPARTMENT_OTHER): Payer: Medicare Other | Admitting: Physician Assistant

## 2020-12-29 ENCOUNTER — Ambulatory Visit (HOSPITAL_COMMUNITY): Payer: Medicare Other

## 2020-12-29 ENCOUNTER — Other Ambulatory Visit: Payer: Self-pay

## 2020-12-29 ENCOUNTER — Other Ambulatory Visit (HOSPITAL_COMMUNITY): Payer: Self-pay | Admitting: Internal Medicine

## 2020-12-29 ENCOUNTER — Ambulatory Visit (HOSPITAL_COMMUNITY)
Admission: RE | Admit: 2020-12-29 | Discharge: 2020-12-29 | Disposition: A | Discharge status: Home / Self Care | Payer: Medicare Other | Source: Ambulatory Visit | Attending: Internal Medicine | Admitting: Internal Medicine

## 2020-12-29 DIAGNOSIS — M19072 Primary osteoarthritis, left ankle and foot: Secondary | ICD-10-CM | POA: Diagnosis not present

## 2020-12-29 DIAGNOSIS — I5032 Chronic diastolic (congestive) heart failure: Secondary | ICD-10-CM | POA: Diagnosis not present

## 2020-12-29 DIAGNOSIS — M7989 Other specified soft tissue disorders: Secondary | ICD-10-CM | POA: Diagnosis not present

## 2020-12-29 DIAGNOSIS — Z872 Personal history of diseases of the skin and subcutaneous tissue: Secondary | ICD-10-CM | POA: Diagnosis not present

## 2020-12-29 DIAGNOSIS — L97529 Non-pressure chronic ulcer of other part of left foot with unspecified severity: Secondary | ICD-10-CM

## 2020-12-29 DIAGNOSIS — E1140 Type 2 diabetes mellitus with diabetic neuropathy, unspecified: Secondary | ICD-10-CM | POA: Diagnosis not present

## 2020-12-29 DIAGNOSIS — E11621 Type 2 diabetes mellitus with foot ulcer: Secondary | ICD-10-CM | POA: Diagnosis not present

## 2020-12-29 DIAGNOSIS — E113293 Type 2 diabetes mellitus with mild nonproliferative diabetic retinopathy without macular edema, bilateral: Secondary | ICD-10-CM | POA: Diagnosis not present

## 2020-12-29 DIAGNOSIS — M86172 Other acute osteomyelitis, left ankle and foot: Secondary | ICD-10-CM | POA: Diagnosis not present

## 2020-12-29 DIAGNOSIS — I110 Hypertensive heart disease with heart failure: Secondary | ICD-10-CM | POA: Diagnosis not present

## 2020-12-29 MED ORDER — GADOBUTROL 1 MMOL/ML IV SOLN: 8.0000 mL | Freq: Once | INTRAVENOUS | Status: DC | PRN

## 2020-12-31 ENCOUNTER — Encounter (HOSPITAL_BASED_OUTPATIENT_CLINIC_OR_DEPARTMENT_OTHER): Payer: Medicare Other | Admitting: Internal Medicine

## 2020-12-31 ENCOUNTER — Other Ambulatory Visit: Payer: Self-pay

## 2020-12-31 DIAGNOSIS — E1122 Type 2 diabetes mellitus with diabetic chronic kidney disease: Secondary | ICD-10-CM | POA: Diagnosis not present

## 2020-12-31 DIAGNOSIS — E11621 Type 2 diabetes mellitus with foot ulcer: Secondary | ICD-10-CM

## 2020-12-31 DIAGNOSIS — E1151 Type 2 diabetes mellitus with diabetic peripheral angiopathy without gangrene: Secondary | ICD-10-CM | POA: Diagnosis not present

## 2020-12-31 DIAGNOSIS — M869 Osteomyelitis, unspecified: Secondary | ICD-10-CM | POA: Diagnosis not present

## 2020-12-31 DIAGNOSIS — L97529 Non-pressure chronic ulcer of other part of left foot with unspecified severity: Secondary | ICD-10-CM

## 2020-12-31 DIAGNOSIS — I130 Hypertensive heart and chronic kidney disease with heart failure and stage 1 through stage 4 chronic kidney disease, or unspecified chronic kidney disease: Secondary | ICD-10-CM | POA: Diagnosis not present

## 2020-12-31 DIAGNOSIS — M86172 Other acute osteomyelitis, left ankle and foot: Secondary | ICD-10-CM | POA: Diagnosis not present

## 2020-12-31 NOTE — Progress Notes (Signed)
TONANTZIN, MIMNAUGH ([id number]) Visit Report for 12/31/2020 Chief Complaint Document Details Patient Name: Date of Service: MO Gerri Spore 12/31/2020 2:45 PM Medical Record Number: [id number] Patient Account Number: [id number] Date of Birth/Sex: Treating RN: 07-Oct-1920 (87 y.o. [name] Primary Care Provider: Lajean Manes T Other Clinician: Referring Provider: Treating Provider/Extender: [name] Weeks in Treatment: 2 Information Obtained from: Patient Chief Complaint Left great toe wound Electronic Signature(s) Signed: 12/31/2020 4:12:32 PM By: Kalman Shan DO Entered By: Kalman Shan on 12/31/2020 16:00:11 -------------------------------------------------------------------------------- HPI Details Patient Name: Date of Service: [name], [name] 12/31/2020 2:45 PM Medical Record Number: [id number] Patient Account Number: [id number] Date of Birth/Sex: Treating RN: June 23, 1921 (87 y.o. [name] Primary Care Provider: Lajean Manes T Other Clinician: Referring Provider: Treating Provider/Extender: [name] in Treatment: 2 History of Present Illness HPI Description: Admission 5/5 Ms. Tsuchiya is a 87 year old female with a past medical history of type 2 diabetes on oral agents, essential hypertension, chronic diastolic congestive heart failure and CKD 3 that presents to our office for a diabetic foot wound located to the left first great toe. Patient is accompanied by daughter and caregiver who helps provide the history. The wound started 1 year ago and has not shown signs of improvement. In fact they state that for the past month it has gotten worse. She has been following with podiatry for this issue. She does not report having an image done of this foot. She states that recently she was placed on doxycycline because a home health nurse was concerned about an infection. She is using collagen every  other day to the wound site. She currently denies any fever/chills, purulent drainage, increased warmth or erythema to the foot. 5/9; patient presents for 1 week follow-up. She states she feels well. She has been using collagen for dressing changes with no issues. She denies any acute signs of infection. She denies fever/chills, nausea/vomiting or drainage From the wound. She is scheduled for an MRI on 5/18 5/20; patient presents for 1 week follow-up. She has been using collagen dressing changes with no issues. She had the MRI of her foot done on 5/18. She denies any signs of local or systemic infection. Electronic Signature(s) Signed: 12/31/2020 4:12:32 PM By: Kalman Shan DO Entered By: Kalman Shan on 12/31/2020 16:02:02 -------------------------------------------------------------------------------- Physical Exam Details Patient Name: Date of Service: [name], [name] 12/31/2020 2:45 PM Medical Record Number: [id number] Patient Account Number: [id number] Date of Birth/Sex: Treating RN: 01-31-1921 (87 y.o. [name] Primary Care Provider: Lajean Manes T Other Clinician: Referring Provider: Treating Provider/Extender: [name] in Treatment: 2 Constitutional respirations regular, non-labored and within target range for patient.. Cardiovascular 2+ dorsalis pedis/posterior tibialis pulses. Psychiatric pleasant and cooperative. Notes Left great toe: Open wound with probing to the bone. There is granulation tissue. Appearance has improved since last clinic visit. No acute signs of soft tissue infection including purulent drainage increased warmth or erythema to the foot. Cartilage exposed. Electronic Signature(s) Signed: 12/31/2020 4:12:32 PM By: Kalman Shan DO Entered By: Kalman Shan on 12/31/2020 16:02:58 -------------------------------------------------------------------------------- Physician Orders Details Patient Name:  Date of Service: [name], [name] 12/31/2020 2:45 PM Medical Record Number: [id number] Patient Account Number: [id number] Date of Birth/Sex: Treating RN: Jan 02, 1921 (87 y.o. [name] Primary Care Provider: Lajean Manes T Other Clinician: Referring Provider: Treating Provider/Extender: [name] in Treatment:  2 Verbal / Phone Orders: No Diagnosis Coding ICD-10 Coding Code Description L97.529 Non-pressure chronic ulcer of other part of left foot with unspecified severity M86.9 Osteomyelitis, unspecified E11.621 Type 2 diabetes mellitus with foot ulcer I10 Essential (primary) hypertension I95.18 Chronic diastolic (congestive) heart failure Follow-up Appointments ppointment in 2 weeks. - with Dr. Heber [place] Return A Bathing/ Shower/ Hygiene May shower and wash wound with soap and water. Edema Control - Lymphedema / SCD / Other Elevate legs to the level of the heart or above for 30 minutes daily and/or when sitting, a frequency of: - throughout the day Moisturize legs daily. Off-Loading Other: - Shoes on only when up walking, do not walk around bare foot Additional Orders / Instructions Follow Nutritious Diet - 100-120g of Protein Home Health No change in wound care orders this week; continue Home Health for wound care. May utilize formulary equivalent dressing for wound treatment orders unless otherwise specified. Other Home Health Orders/Instructions: - [facility] Wound Treatment Wound #1 - T Great oe Wound Laterality: Left Cleanser: Normal Saline (Home Health) Every Other Day/15 Days Discharge Instructions: Cleanse the wound with Normal Saline or wound cleanser prior to applying a clean dressing using gauze sponges, not tissue or cotton balls. Cleanser: Soap and Water [facility]) Every Other Day/15 Days Discharge Instructions: May shower and wash wound with dial antibacterial soap and water prior to dressing change. Prim Dressing:  FIBRACOL Plus Dressing, 2x2 in (collagen) (Home Health) Every Other Day/15 Days ary Discharge Instructions: Moisten collagen with saline or hydrogel Secondary Dressing: Woven Gauze Sponge, Non-Sterile 4x4 in [facility]) Every Other Day/15 Days Discharge Instructions: Apply over primary dressing as directed. Secondary Dressing: Felt 2.5 yds x 5.5 in [facility]) Every Other Day/15 Days Discharge Instructions: Apply as donut over primary dressing Secured With: Conforming Stretch Gauze Bandage, Sterile 2x75 (in/in) (Home Health) Every Other Day/15 Days Discharge Instructions: Secure with stretch gauze as directed. Secured With: 75M Medipore H Soft Cloth Surgical T 4 x 2 (in/yd) (Home Health) Every Other Day/15 Days ape Discharge Instructions: Secure dressing with tape as directed. Consults Infectious Disease - **Stat Referral to treat osteomyelitis of left great toe** - (ICD10 L97.529 - Non-pressure chronic ulcer of other part of left foot with unspecified severity) Electronic Signature(s) Signed: 12/31/2020 4:12:32 PM By: Kalman Shan DO Previous Signature: 12/31/2020 3:13:04 PM Version By: Lorrin Jackson Entered By: Kalman Shan on 12/31/2020 16:04:09 -------------------------------------------------------------------------------- Problem List Details Patient Name: Date of Service: [name], [name] 12/31/2020 2:45 PM Medical Record Number: [id number] Patient Account Number: [id number] Date of Birth/Sex: Treating RN: 04-Oct-1920 (87 y.o. [name] Primary Care Provider: Lajean Manes T Other Clinician: Referring Provider: Treating Provider/Extender: [name] in Treatment: 2 Active Problems ICD-10 Encounter Code Description Active Date MDM Diagnosis L97.529 Non-pressure chronic ulcer of other part of left foot with unspecified severity 12/16/2020 No Yes M86.9 Osteomyelitis, unspecified 12/31/2020 No Yes E11.621 Type 2 diabetes  mellitus with foot ulcer 12/16/2020 No Yes I10 Essential (primary) hypertension 12/16/2020 No Yes Z60.10 Chronic diastolic (congestive) heart failure 12/16/2020 No Yes Inactive Problems Resolved Problems Electronic Signature(s) Signed: 12/31/2020 4:12:32 PM By: Kalman Shan DO Previous Signature: 12/31/2020 3:12:41 PM Version By: Lorrin Jackson Entered By: Kalman Shan on 12/31/2020 15:59:55 -------------------------------------------------------------------------------- Progress Note Details Patient Name: Date of Service: [name], [name] 12/31/2020 2:45 PM Medical Record Number: [id number] Patient Account Number: [id number] Date of Birth/Sex: Treating RN: 07/03/1921 (87 y.o. [name] Primary Care Provider: Felipa Eth,  Hal T Other Clinician: Referring Provider: Treating Provider/Extender: [name] in Treatment: 2 Subjective Chief Complaint Information obtained from Patient Left great toe wound History of Present Illness (HPI) Admission 5/5 Ms. Vanmeeteren is a 87 year old female with a past medical history of type 2 diabetes on oral agents, essential hypertension, chronic diastolic congestive heart failure and CKD 3 that presents to our office for a diabetic foot wound located to the left first great toe. Patient is accompanied by daughter and caregiver who helps provide the history. The wound started 1 year ago and has not shown signs of improvement. In fact they state that for the past month it has gotten worse. She has been following with podiatry for this issue. She does not report having an image done of this foot. She states that recently she was placed on doxycycline because a home health nurse was concerned about an infection. She is using collagen every other day to the wound site. She currently denies any fever/chills, purulent drainage, increased warmth or erythema to the foot. 5/9; patient presents for 1 week follow-up. She states she  feels well. She has been using collagen for dressing changes with no issues. She denies any acute signs of infection. She denies fever/chills, nausea/vomiting or drainage From the wound. She is scheduled for an MRI on 5/18 5/20; patient presents for 1 week follow-up. She has been using collagen dressing changes with no issues. She had the MRI of her foot done on 5/18. She denies any signs of local or systemic infection. Patient History Information obtained from Patient. Family History Cancer - Siblings, Diabetes - Mother,Maternal Grandparents, Heart Disease - Mother, Hypertension - Mother, Stroke - Siblings, No family history of Hereditary Spherocytosis, Kidney Disease, Lung Disease, Seizures, Thyroid Problems, Tuberculosis. Social History Never smoker, Marital Status - Widowed, Alcohol Use - Never, Drug Use - No History, Caffeine Use - Daily. Medical History Cardiovascular Patient has history of Congestive Heart Failure, Hypertension, Peripheral Arterial Disease, Phlebitis Endocrine Patient has history of Type II Diabetes Musculoskeletal Patient has history of Osteoarthritis Medical A Surgical History Notes nd Eyes Degenerative Eye Disease Respiratory Uses inhaler Gastrointestinal Hiatal Hernia Genitourinary CKD stage 3-Incontinence Objective Constitutional respirations regular, non-labored and within target range for patient.. Vitals Time Taken: 2:45 PM, Temperature: 98 F, Pulse: 105 bpm, Respiratory Rate: 18 breaths/min, Blood Pressure: 165/91 mmHg. Cardiovascular 2+ dorsalis pedis/posterior tibialis pulses. Psychiatric pleasant and cooperative. General Notes: Left great toe: Open wound with probing to the bone. There is granulation tissue. Appearance has improved since last clinic visit. No acute signs of soft tissue infection including purulent drainage increased warmth or erythema to the foot. Cartilage exposed. Integumentary (Hair, Skin) Wound #1 status is Open.  Original cause of wound was Other Lesion. The date acquired was: 11/24/2019. The wound has been in treatment 2 weeks. The wound is located on the Left T Great. The wound measures 0.9cm length x 1.1cm width x 1cm depth; 0.778cm^2 area and 0.778cm^3 volume. There is bone and Fat oe Layer (Subcutaneous Tissue) exposed. There is no tunneling or undermining noted. There is a medium amount of purulent drainage noted. The wound margin is well defined and not attached to the wound base. There is large (67-100%) red granulation within the wound bed. There is no necrotic tissue within the wound bed. Assessment Active Problems ICD-10 Non-pressure chronic ulcer of other part of left foot with unspecified severity Osteomyelitis, unspecified Type 2 diabetes mellitus with foot ulcer Essential (primary) hypertension Chronic diastolic (congestive)  heart failure Patient had an MRI of the left foot on 5/18 and received results today. The results were discussed with the patient and her niece Dr. Rodena Medin who was present during the encounter today. The patient has acute osteomyelitis of the proximal phalanx and distal phalanx of the left great toe with IP joint effusion compatible with septic arthritis. We discussed the option of IV antibiotics versus amputation. Patient and niece did not want to consider amputation at this time. I will do a stat referral to infectious disease. I advised that if there were any signs of soft tissue infection to go to the ED. Currently the wound and surrounding tissue does not appear infected. In fact it looks better than on admission. We will continue with collagen. She can follow-up in 2 weeks. She can call and be seen sooner if needed. Plan Follow-up Appointments: Return Appointment in 2 weeks. - with Dr. Heber [place] Bathing/ Shower/ Hygiene: May shower and wash wound with soap and water. Edema Control - Lymphedema / SCD / Other: Elevate legs to the level of the heart or  above for 30 minutes daily and/or when sitting, a frequency of: - throughout the day Moisturize legs daily. Off-Loading: Other: - Shoes on only when up walking, do not walk around bare foot Additional Orders / Instructions: Follow Nutritious Diet - 100-120g of Protein Home Health: No change in wound care orders this week; continue Home Health for wound care. May utilize formulary equivalent dressing for wound treatment orders unless otherwise specified. Other Home Health Orders/Instructions: - [facility] Consults ordered were: Infectious Disease - **Stat Referral to treat osteomyelitis of left great toe** WOUND #1: - T Great Wound Laterality: Left oe Cleanser: Normal Saline (Home Health) Every Other Day/15 Days Discharge Instructions: Cleanse the wound with Normal Saline or wound cleanser prior to applying a clean dressing using gauze sponges, not tissue or cotton balls. Cleanser: Soap and Water [facility]) Every Other Day/15 Days Discharge Instructions: May shower and wash wound with dial antibacterial soap and water prior to dressing change. Prim Dressing: FIBRACOL Plus Dressing, 2x2 in (collagen) (Home Health) Every Other Day/15 Days ary Discharge Instructions: Moisten collagen with saline or hydrogel Secondary Dressing: Woven Gauze Sponge, Non-Sterile 4x4 in [facility]) Every Other Day/15 Days Discharge Instructions: Apply over primary dressing as directed. Secondary Dressing: Felt 2.5 yds x 5.5 in [facility]) Every Other Day/15 Days Discharge Instructions: Apply as donut over primary dressing Secured With: Conforming Stretch Gauze Bandage, Sterile 2x75 (in/in) (Home Health) Every Other Day/15 Days Discharge Instructions: Secure with stretch gauze as directed. Secured With: 2M Medipore H Soft Cloth Surgical T 4 x 2 (in/yd) (Home Health) Every Other Day/15 Days ape Discharge Instructions: Secure dressing with tape as directed. 1. Stat referral to infectious disease 2.  Collagen 3. Follow-up in 2 weeks Electronic Signature(s) Signed: 12/31/2020 4:12:32 PM By: Kalman Shan DO Entered By: Kalman Shan on 12/31/2020 16:11:31 -------------------------------------------------------------------------------- HxROS Details Patient Name: Date of Service: [name], [name] 12/31/2020 2:45 PM Medical Record Number: [id number] Patient Account Number: [id number] Date of Birth/Sex: Treating RN: Oct 04, 1920 (87 y.o. [name] Primary Care Provider: Lajean Manes T Other Clinician: Referring Provider: Treating Provider/Extender: [name] Weeks in Treatment: 2 Information Obtained From Patient Eyes Medical History: Past Medical History Notes: Degenerative Eye Disease Respiratory Medical History: Past Medical History Notes: Uses inhaler Cardiovascular Medical History: Positive for: Congestive Heart Failure; Hypertension; Peripheral Arterial Disease; Phlebitis Gastrointestinal Medical History: Past Medical History Notes:  Hiatal Hernia Endocrine Medical History: Positive for: Type II Diabetes Time with diabetes: 30+ years Treated with: Oral agents Blood sugar tested every day: Yes Tested : QOD Genitourinary Medical History: Past Medical History Notes: CKD stage 3-Incontinence Musculoskeletal Medical History: Positive for: Osteoarthritis Immunizations Pneumococcal Vaccine: Received Pneumococcal Vaccination: Yes Implantable Devices None Family and Social History Cancer: Yes - Siblings; Diabetes: Yes - Mother,Maternal Grandparents; Heart Disease: Yes - Mother; Hereditary Spherocytosis: No; Hypertension: Yes - Mother; Kidney Disease: No; Lung Disease: No; Seizures: No; Stroke: Yes - Siblings; Thyroid Problems: No; Tuberculosis: No; Never smoker; Marital Status - Widowed; Alcohol Use: Never; Drug Use: No History; Caffeine Use: Daily; Financial Concerns: No; Food, Clothing or Shelter Needs: No; Support  System Lacking: No; Transportation Concerns: No Electronic Signature(s) Signed: 12/31/2020 4:12:32 PM By: Kalman Shan DO Signed: 12/31/2020 5:20:22 PM By: Lorrin Jackson Entered By: Kalman Shan on 12/31/2020 16:02:08 -------------------------------------------------------------------------------- SuperBill Details Patient Name: Date of Service: [name], [name] 12/31/2020 Medical Record Number: [id number] Patient Account Number: [id number] Date of Birth/Sex: Treating RN: 05-09-1921 (87 y.o. [name] Primary Care Provider: Lajean Manes T Other Clinician: Referring Provider: Treating Provider/Extender: [name] in Treatment: 2 Diagnosis Coding ICD-10 Codes Code Description [phone] Non-pressure chronic ulcer of other part of left foot with unspecified severity M86.9 Osteomyelitis, unspecified E11.621 Type 2 diabetes mellitus with foot ulcer I10 Essential (primary) hypertension P38.25 Chronic diastolic (congestive) heart failure Facility Procedures CPT4 Code: [id number] Description: 99213 - WOUND CARE VISIT-LEV 3 EST PT Modifier: Quantity: 1 Physician Procedures : CPT4 Code Description Modifier [id number] - WC PHYS LEVEL 4 - EST PT ICD-10 Diagnosis Description M86.9 Osteomyelitis, unspecified E11.621 Type 2 diabetes mellitus with foot ulcer L97.529 Non-pressure chronic ulcer of other part of left foot with  unspecified severity Quantity: 1 Electronic Signature(s) Signed: 12/31/2020 4:12:32 PM By: Kalman Shan DO Entered By: Kalman Shan on 12/31/2020 16:12:04

## 2020-12-31 NOTE — Progress Notes (Addendum)
Signature(s) Signed: 12/31/2020 5:00:37 PM By: Baruch Gouty RN, BSN Entered By: Baruch Gouty on 12/31/2020 14:45:58 -------------------------------------------------------------------------------- Patient/Caregiver Education Details Patient Name: Date of Service: Heidi Dominguez, Heidi RA S. 5/20/2022andnbsp2:45 PM Medical Record Number: [id number] Patient Account Number: [id number] Date of Birth/Gender: Treating RN: 22-Sep-1920 (87 y.o. [name] Primary Care Physician: Lajean Manes T Other Clinician: Referring Physician: Treating Physician/Extender: Eugenio Hoes in Treatment: 2 Education  Assessment Education Provided To: Patient Education Topics Provided Infection: Methods: Explain/Verbal Responses: State content correctly Offloading: Methods: Explain/Verbal, Printed Responses: State content correctly Wound/Skin Impairment: Methods: Explain/Verbal, Printed Responses: State content correctly Electronic Signature(s) Signed: 12/31/2020 5:20:22 PM By: Lorrin Jackson Entered By: Lorrin Jackson on 12/31/2020 15:14:21 -------------------------------------------------------------------------------- Wound Assessment Details Patient Name: Date of Service: Heidi Dominguez, Heidi RA S. 12/31/2020 2:45 PM Medical Record Number: [id number] Patient Account Number: [id number] Date of Birth/Sex: Treating RN: 1921-01-05 (87 y.o. [name] Primary Care Kenson Groh: Lajean Manes T Other Clinician: Referring Marria Mathison: Treating Kenlea Woodell/Extender: [name] in Treatment: 2 Wound Status Wound Number: 1 Primary Diabetic Wound/Ulcer of the Lower Extremity Etiology: Wound Location: Left T Great oe Wound Open Wounding Event: Other Lesion Status: Date Acquired: 11/24/2019 Comorbid Congestive Heart Failure, Hypertension, Peripheral Arterial Weeks Of Treatment: 2 History: Disease, Phlebitis, Type II Diabetes, Osteoarthritis Clustered Wound: No Photos Wound Measurements Length: (cm) 0.9 Width: (cm) 1.1 Depth: (cm) 1 Area: (cm) 0.778 Volume: (cm) 0.778 % Reduction in Area: 36.5% % Reduction in Volume: 66.6% Epithelialization: None Tunneling: No Undermining: No Wound Description Classification: Grade 2 Wound Margin: Well defined, not attached Exudate Amount: Medium Exudate Type: Purulent Exudate Color: yellow, brown, green Foul Odor After Cleansing: No Slough/Fibrino No Wound Bed Granulation Amount: Large (67-100%) Exposed Structure Granulation Quality: Red Fascia Exposed: No Necrotic Amount: None Present (0%) Fat Layer (Subcutaneous  Tissue) Exposed: Yes Tendon Exposed: No Muscle Exposed: No Joint Exposed: No Bone Exposed: Yes Treatment Notes Wound #1 (Toe Great) Wound Laterality: Left Cleanser Normal Saline Discharge Instruction: Cleanse the wound with Normal Saline or wound cleanser prior to applying a clean dressing using gauze sponges, not tissue or cotton balls. Soap and Water Discharge Instruction: May shower and wash wound with dial antibacterial soap and water prior to dressing change. Peri-Wound Care Topical Primary Dressing FIBRACOL Plus Dressing, 2x2 in (collagen) Discharge Instruction: Moisten collagen with saline or hydrogel Secondary Dressing Woven Gauze Sponge, Non-Sterile 4x4 in Discharge Instruction: Apply over primary dressing as directed. Felt 2.5 yds x 5.5 in Discharge Instruction: Apply as donut over primary dressing Secured With Conforming Stretch Gauze Bandage, Sterile 2x75 (in/in) Discharge Instruction: Secure with stretch gauze as directed. 19M Medipore H Soft Cloth Surgical T 4 x 2 (in/yd) ape Discharge Instruction: Secure dressing with tape as directed. Compression Wrap Compression Stockings Add-Ons Electronic Signature(s) Signed: 01/03/2021 3:41:13 PM By: Sandre Kitty Signed: 01/03/2021 6:04:09 PM By: Lorrin Jackson Previous Signature: 12/31/2020 5:20:22 PM Version By: Lorrin Jackson Entered By: Sandre Kitty on 01/03/2021 09:03:00 -------------------------------------------------------------------------------- Vitals Details Patient Name: Date of Service: Heidi Dominguez, Heidi RA S. 12/31/2020 2:45 PM Medical Record Number: [id number] Patient Account Number: [id number] Date of Birth/Sex: Treating RN: 01-13-1921 (87 y.o. Elam Dutch Primary Care [name]: Lajean Manes T Other Clinician: Referring Audianna Landgren: Treating Shneur Whitten[place]/Extender: [name] in Treatment: 2 Vital Signs Time Taken: 14:45 Temperature (F): 98 Pulse (bpm):  105 Respiratory Rate (breaths/min): 18 Blood Pressure (mmHg): 165/91 Reference Range: 80 - 120 mg / dl Electronic Signature(s)  Peripheral Arterial Disease, Phlebitis, Type II Diabetes, Osteoarthritis 11/24/2019] [N/A:N/A] Date Acquired: [1:2] [N/A:N/A] Weeks of Treatment: [1:Open] [N/A:N/A] Wound Status: [1:0.9x1.1x1] [N/A:N/A] Measurements L x W x D (cm) [1:0.778] [N/A:N/A] A (cm) : rea [1:0.778] [N/A:N/A] Volume (cm) : [1:36.50%] [N/A:N/A] % Reduction in A rea: [1:66.60%] [N/A:N/A] % Reduction in Volume: [1:Grade 2] [N/A:N/A] Classification: [1:Medium] [N/A:N/A] Exudate A mount: [1:Purulent] [N/A:N/A] Exudate Type: [1:yellow, brown, green] [N/A:N/A] Exudate Color: [1:Well defined, not attached] [N/A:N/A] Wound Margin: [1:Large (67-100%)] [N/A:N/A] Granulation A mount: [1:Red] [N/A:N/A] Granulation Quality: [1:None Present (0%)] [N/A:N/A] Necrotic A mount: [1:Fat Layer (Subcutaneous Tissue): Yes N/A] Exposed Structures: [1:Bone: Yes Fascia: No Tendon: No Muscle: No Joint: No None] [N/A:N/A] Treatment Notes Wound #1 (Toe Great) Wound Laterality: Left Cleanser Normal Saline Discharge Instruction: Cleanse the wound with Normal Saline or wound cleanser prior to applying a clean dressing using gauze sponges, not tissue or cotton balls. Soap and Water Discharge Instruction: May shower and wash wound with dial antibacterial soap and water prior to dressing change. Peri-Wound Care Topical Primary Dressing FIBRACOL Plus Dressing, 2x2 in  (collagen) Discharge Instruction: Moisten collagen with saline or hydrogel Secondary Dressing Woven Gauze Sponge, Non-Sterile 4x4 in Discharge Instruction: Apply over primary dressing as directed. Felt 2.5 yds x 5.5 in Discharge Instruction: Apply as donut over primary dressing Secured With Conforming Stretch Gauze Bandage, Sterile 2x75 (in/in) Discharge Instruction: Secure with stretch gauze as directed. 75M Medipore H Soft Cloth Surgical T 4 x 2 (in/yd) ape Discharge Instruction: Secure dressing with tape as directed. Compression Wrap Compression Stockings Add-Ons Electronic Signature(s) Signed: 12/31/2020 4:12:32 PM By: Kalman Shan DO Signed: 12/31/2020 5:20:22 PM By: Lorrin Jackson Entered By: Kalman Shan on 12/31/2020 16:00:02 -------------------------------------------------------------------------------- Multi-Disciplinary Care Plan Details Patient Name: Date of Service: Heidi Dominguez, Heidi RA S. 12/31/2020 2:45 PM Medical Record Number: [id number] Patient Account Number: [id number] Date of Birth/Sex: Treating RN: Oct 27, 1920 (87 y.o. [name] Primary Care Ressie Slevin: Lajean Manes T Other Clinician: Referring Algis Lehenbauer: Treating Emerita Berkemeier/Extender: [name] Weeks in Treatment: 2 Active Inactive Orientation to the Wound Care Program Nursing Diagnoses: Knowledge deficit related to the wound healing center program Goals: Patient/caregiver will verbalize understanding of the [facility] Date Initiated: 12/16/2020 Target Resolution Date: 01/13/2021 Goal Status: Active Interventions: Provide education on orientation to the wound center Notes: Wound/Skin Impairment Nursing Diagnoses: Impaired tissue integrity Goals: Patient/caregiver will verbalize understanding of skin care regimen Date Initiated: 12/16/2020 Target Resolution Date: 01/13/2021 Goal Status: Active Ulcer/skin breakdown will have a volume reduction of 30% by  week 4 Date Initiated: 12/16/2020 Target Resolution Date: 01/13/2021 Goal Status: Active Interventions: Assess patient/caregiver ability to obtain necessary supplies Assess patient/caregiver ability to perform ulcer/skin care regimen upon admission and as needed Assess ulceration(s) every visit Provide education on ulcer and skin care Treatment Activities: Skin care regimen initiated : 12/16/2020 Topical wound management initiated : 12/16/2020 Notes: Electronic Signature(s) Signed: 12/31/2020 3:13:15 PM By: Lorrin Jackson Entered By: Lorrin Jackson on 12/31/2020 15:13:15 -------------------------------------------------------------------------------- Pain Assessment Details Patient Name: Date of Service: Heidi Dominguez, Heidi RA S. 12/31/2020 2:45 PM Medical Record Number: [id number] Patient Account Number: [id number] Date of Birth/Sex: Treating RN: Feb 19, 1921 (87 y.o. Elam Dutch Primary Care Braelee Herrle: Lajean Manes T Other Clinician: Referring Arye Weyenberg: Treating Seeley Hissong/Extender: [name] in Treatment: 2 Active Problems Location of Pain Severity and Description of Pain Patient Has Paino No Site Locations Rate the pain. Current Pain Level: 0 Pain Management and Medication Current Pain Management: Electronic  Peripheral Arterial Disease, Phlebitis, Type II Diabetes, Osteoarthritis 11/24/2019] [N/A:N/A] Date Acquired: [1:2] [N/A:N/A] Weeks of Treatment: [1:Open] [N/A:N/A] Wound Status: [1:0.9x1.1x1] [N/A:N/A] Measurements L x W x D (cm) [1:0.778] [N/A:N/A] A (cm) : rea [1:0.778] [N/A:N/A] Volume (cm) : [1:36.50%] [N/A:N/A] % Reduction in A rea: [1:66.60%] [N/A:N/A] % Reduction in Volume: [1:Grade 2] [N/A:N/A] Classification: [1:Medium] [N/A:N/A] Exudate A mount: [1:Purulent] [N/A:N/A] Exudate Type: [1:yellow, brown, green] [N/A:N/A] Exudate Color: [1:Well defined, not attached] [N/A:N/A] Wound Margin: [1:Large (67-100%)] [N/A:N/A] Granulation A mount: [1:Red] [N/A:N/A] Granulation Quality: [1:None Present (0%)] [N/A:N/A] Necrotic A mount: [1:Fat Layer (Subcutaneous Tissue): Yes N/A] Exposed Structures: [1:Bone: Yes Fascia: No Tendon: No Muscle: No Joint: No None] [N/A:N/A] Treatment Notes Wound #1 (Toe Great) Wound Laterality: Left Cleanser Normal Saline Discharge Instruction: Cleanse the wound with Normal Saline or wound cleanser prior to applying a clean dressing using gauze sponges, not tissue or cotton balls. Soap and Water Discharge Instruction: May shower and wash wound with dial antibacterial soap and water prior to dressing change. Peri-Wound Care Topical Primary Dressing FIBRACOL Plus Dressing, 2x2 in  (collagen) Discharge Instruction: Moisten collagen with saline or hydrogel Secondary Dressing Woven Gauze Sponge, Non-Sterile 4x4 in Discharge Instruction: Apply over primary dressing as directed. Felt 2.5 yds x 5.5 in Discharge Instruction: Apply as donut over primary dressing Secured With Conforming Stretch Gauze Bandage, Sterile 2x75 (in/in) Discharge Instruction: Secure with stretch gauze as directed. 75M Medipore H Soft Cloth Surgical T 4 x 2 (in/yd) ape Discharge Instruction: Secure dressing with tape as directed. Compression Wrap Compression Stockings Add-Ons Electronic Signature(s) Signed: 12/31/2020 4:12:32 PM By: Kalman Shan DO Signed: 12/31/2020 5:20:22 PM By: Lorrin Jackson Entered By: Kalman Shan on 12/31/2020 16:00:02 -------------------------------------------------------------------------------- Multi-Disciplinary Care Plan Details Patient Name: Date of Service: Heidi Dominguez, Heidi RA S. 12/31/2020 2:45 PM Medical Record Number: [id number] Patient Account Number: [id number] Date of Birth/Sex: Treating RN: Oct 27, 1920 (87 y.o. [name] Primary Care Ressie Slevin: Lajean Manes T Other Clinician: Referring Algis Lehenbauer: Treating Emerita Berkemeier/Extender: [name] Weeks in Treatment: 2 Active Inactive Orientation to the Wound Care Program Nursing Diagnoses: Knowledge deficit related to the wound healing center program Goals: Patient/caregiver will verbalize understanding of the [facility] Date Initiated: 12/16/2020 Target Resolution Date: 01/13/2021 Goal Status: Active Interventions: Provide education on orientation to the wound center Notes: Wound/Skin Impairment Nursing Diagnoses: Impaired tissue integrity Goals: Patient/caregiver will verbalize understanding of skin care regimen Date Initiated: 12/16/2020 Target Resolution Date: 01/13/2021 Goal Status: Active Ulcer/skin breakdown will have a volume reduction of 30% by  week 4 Date Initiated: 12/16/2020 Target Resolution Date: 01/13/2021 Goal Status: Active Interventions: Assess patient/caregiver ability to obtain necessary supplies Assess patient/caregiver ability to perform ulcer/skin care regimen upon admission and as needed Assess ulceration(s) every visit Provide education on ulcer and skin care Treatment Activities: Skin care regimen initiated : 12/16/2020 Topical wound management initiated : 12/16/2020 Notes: Electronic Signature(s) Signed: 12/31/2020 3:13:15 PM By: Lorrin Jackson Entered By: Lorrin Jackson on 12/31/2020 15:13:15 -------------------------------------------------------------------------------- Pain Assessment Details Patient Name: Date of Service: Heidi Dominguez, Heidi RA S. 12/31/2020 2:45 PM Medical Record Number: [id number] Patient Account Number: [id number] Date of Birth/Sex: Treating RN: Feb 19, 1921 (87 y.o. Elam Dutch Primary Care Braelee Herrle: Lajean Manes T Other Clinician: Referring Arye Weyenberg: Treating Seeley Hissong/Extender: [name] in Treatment: 2 Active Problems Location of Pain Severity and Description of Pain Patient Has Paino No Site Locations Rate the pain. Current Pain Level: 0 Pain Management and Medication Current Pain Management: Electronic  AIREL, MAGADAN ([id number]) Visit Report for 12/31/2020 Arrival Information Details Patient Name: Date of Service: Heidi NTA Lyn Records 12/31/2020 2:45 PM Medical Record Number: [id number] Patient Account Number: [id number] Date of Birth/Sex: Treating RN: 25-Jul-1921 (87 y.o. Martyn Malay, Linda Primary Care Chrystian Ressler: Lajean Manes T Other Clinician: Referring Rian Busche: Treating Verta Riedlinger/Extender: [name] Weeks in Treatment: 2 Visit Information History Since Last Visit All ordered tests and consults were completed: Yes Patient Arrived: Wheel Chair Added or deleted any medications: No Arrival Time: 14:34 Any new allergies or adverse reactions: No Accompanied By: niece Had a fall or experienced change in No Transfer Assistance: None activities of daily living that may affect Patient Identification Verified: Yes risk of falls: Secondary Verification Process Completed: Yes Signs or symptoms of abuse/neglect since last visito No Patient Requires Transmission-Based No Hospitalized since last visit: Yes Precautions: Implantable device outside of the clinic excluding No Patient Has Alerts: Yes cellular tissue based products placed in the center Patient Alerts: Patient on Blood Thinner since last visit: ABI RandL=Non Has Dressing in Place as Prescribed: Yes Compressible Has Compression in Place as Prescribed: No TBI R=0.87 L=0.62 Pain Present Now: No Electronic Signature(s) Signed: 12/31/2020 5:00:37 PM By: Baruch Gouty RN, BSN Entered By: Baruch Gouty on 12/31/2020 14:45:13 -------------------------------------------------------------------------------- Clinic Level of Care Assessment Details Patient Name: Date of Service: Heidi Dominguez, Heidi RA S. 12/31/2020 2:45 PM Medical Record Number: [id number] Patient Account Number: [id number] Date of Birth/Sex: Treating RN: 05/20/1921 (87 y.o. [name] Primary Care Tequlia Gonsalves: Lajean Manes T Other  Clinician: Referring Taleah Bellantoni: Treating Juston Goheen/Extender: [name] Weeks in Treatment: 2 Clinic Level of Care Assessment Items TOOL 4 Quantity Score X- 1 0 Use when only an EandM is performed on FOLLOW-UP visit ASSESSMENTS - Nursing Assessment / Reassessment X- 1 10 Reassessment of Heidi-morbidities (includes updates in patient status) X- 1 5 Reassessment of Adherence to Treatment Plan ASSESSMENTS - Wound and Skin A ssessment / Reassessment X - Simple Wound Assessment / Reassessment - one wound 1 5 []  - 0 Complex Wound Assessment / Reassessment - multiple wounds []  - 0 Dermatologic / Skin Assessment (not related to wound area) ASSESSMENTS - Focused Assessment []  - 0 Circumferential Edema Measurements - multi extremities []  - 0 Nutritional Assessment / Counseling / Intervention []  - 0 Lower Extremity Assessment (monofilament, tuning fork, pulses) []  - 0 Peripheral Arterial Disease Assessment (using hand held doppler) ASSESSMENTS - Ostomy and/or Continence Assessment and Care []  - 0 Incontinence Assessment and Management []  - 0 Ostomy Care Assessment and Management (repouching, etc.) PROCESS - Coordination of Care []  - 0 Simple Patient / Family Education for ongoing care X- 1 20 Complex (extensive) Patient / Family Education for ongoing care X- 1 10 Staff obtains Programmer, systems, Records, T Results / Process Orders est X- 1 10 Staff telephones HHA, Nursing Homes / Clarify orders / etc []  - 0 Routine Transfer to another Facility (non-emergent condition) []  - 0 Routine Hospital Admission (non-emergent condition) []  - 0 New Admissions / Biomedical engineer / Ordering NPWT Apligraf, etc. , []  - 0 Emergency Hospital Admission (emergent condition) []  - 0 Simple Discharge Coordination []  - 0 Complex (extensive) Discharge Coordination PROCESS - Special Needs []  - 0 Pediatric / Minor Patient Management []  - 0 Isolation Patient Management []  -  0 Hearing / Language / Visual special needs []  - 0 Assessment of Community assistance (transportation, D/C planning, etc.) []  - 0 Additional assistance / Altered mentation []  -  AIREL, MAGADAN ([id number]) Visit Report for 12/31/2020 Arrival Information Details Patient Name: Date of Service: Heidi NTA Lyn Records 12/31/2020 2:45 PM Medical Record Number: [id number] Patient Account Number: [id number] Date of Birth/Sex: Treating RN: 25-Jul-1921 (87 y.o. Martyn Malay, Linda Primary Care Chrystian Ressler: Lajean Manes T Other Clinician: Referring Rian Busche: Treating Verta Riedlinger/Extender: [name] Weeks in Treatment: 2 Visit Information History Since Last Visit All ordered tests and consults were completed: Yes Patient Arrived: Wheel Chair Added or deleted any medications: No Arrival Time: 14:34 Any new allergies or adverse reactions: No Accompanied By: niece Had a fall or experienced change in No Transfer Assistance: None activities of daily living that may affect Patient Identification Verified: Yes risk of falls: Secondary Verification Process Completed: Yes Signs or symptoms of abuse/neglect since last visito No Patient Requires Transmission-Based No Hospitalized since last visit: Yes Precautions: Implantable device outside of the clinic excluding No Patient Has Alerts: Yes cellular tissue based products placed in the center Patient Alerts: Patient on Blood Thinner since last visit: ABI RandL=Non Has Dressing in Place as Prescribed: Yes Compressible Has Compression in Place as Prescribed: No TBI R=0.87 L=0.62 Pain Present Now: No Electronic Signature(s) Signed: 12/31/2020 5:00:37 PM By: Baruch Gouty RN, BSN Entered By: Baruch Gouty on 12/31/2020 14:45:13 -------------------------------------------------------------------------------- Clinic Level of Care Assessment Details Patient Name: Date of Service: Heidi Dominguez, Heidi RA S. 12/31/2020 2:45 PM Medical Record Number: [id number] Patient Account Number: [id number] Date of Birth/Sex: Treating RN: 05/20/1921 (87 y.o. [name] Primary Care Tequlia Gonsalves: Lajean Manes T Other  Clinician: Referring Taleah Bellantoni: Treating Juston Goheen/Extender: [name] Weeks in Treatment: 2 Clinic Level of Care Assessment Items TOOL 4 Quantity Score X- 1 0 Use when only an EandM is performed on FOLLOW-UP visit ASSESSMENTS - Nursing Assessment / Reassessment X- 1 10 Reassessment of Heidi-morbidities (includes updates in patient status) X- 1 5 Reassessment of Adherence to Treatment Plan ASSESSMENTS - Wound and Skin A ssessment / Reassessment X - Simple Wound Assessment / Reassessment - one wound 1 5 []  - 0 Complex Wound Assessment / Reassessment - multiple wounds []  - 0 Dermatologic / Skin Assessment (not related to wound area) ASSESSMENTS - Focused Assessment []  - 0 Circumferential Edema Measurements - multi extremities []  - 0 Nutritional Assessment / Counseling / Intervention []  - 0 Lower Extremity Assessment (monofilament, tuning fork, pulses) []  - 0 Peripheral Arterial Disease Assessment (using hand held doppler) ASSESSMENTS - Ostomy and/or Continence Assessment and Care []  - 0 Incontinence Assessment and Management []  - 0 Ostomy Care Assessment and Management (repouching, etc.) PROCESS - Coordination of Care []  - 0 Simple Patient / Family Education for ongoing care X- 1 20 Complex (extensive) Patient / Family Education for ongoing care X- 1 10 Staff obtains Programmer, systems, Records, T Results / Process Orders est X- 1 10 Staff telephones HHA, Nursing Homes / Clarify orders / etc []  - 0 Routine Transfer to another Facility (non-emergent condition) []  - 0 Routine Hospital Admission (non-emergent condition) []  - 0 New Admissions / Biomedical engineer / Ordering NPWT Apligraf, etc. , []  - 0 Emergency Hospital Admission (emergent condition) []  - 0 Simple Discharge Coordination []  - 0 Complex (extensive) Discharge Coordination PROCESS - Special Needs []  - 0 Pediatric / Minor Patient Management []  - 0 Isolation Patient Management []  -  0 Hearing / Language / Visual special needs []  - 0 Assessment of Community assistance (transportation, D/C planning, etc.) []  - 0 Additional assistance / Altered mentation []  -

## 2021-01-03 DIAGNOSIS — I5032 Chronic diastolic (congestive) heart failure: Secondary | ICD-10-CM | POA: Diagnosis not present

## 2021-01-03 DIAGNOSIS — L97529 Non-pressure chronic ulcer of other part of left foot with unspecified severity: Secondary | ICD-10-CM | POA: Diagnosis not present

## 2021-01-03 DIAGNOSIS — E11621 Type 2 diabetes mellitus with foot ulcer: Secondary | ICD-10-CM | POA: Diagnosis not present

## 2021-01-03 DIAGNOSIS — E1140 Type 2 diabetes mellitus with diabetic neuropathy, unspecified: Secondary | ICD-10-CM | POA: Diagnosis not present

## 2021-01-03 DIAGNOSIS — E113293 Type 2 diabetes mellitus with mild nonproliferative diabetic retinopathy without macular edema, bilateral: Secondary | ICD-10-CM | POA: Diagnosis not present

## 2021-01-03 DIAGNOSIS — I110 Hypertensive heart disease with heart failure: Secondary | ICD-10-CM | POA: Diagnosis not present

## 2021-01-05 DIAGNOSIS — I5032 Chronic diastolic (congestive) heart failure: Secondary | ICD-10-CM | POA: Diagnosis not present

## 2021-01-05 DIAGNOSIS — E113293 Type 2 diabetes mellitus with mild nonproliferative diabetic retinopathy without macular edema, bilateral: Secondary | ICD-10-CM | POA: Diagnosis not present

## 2021-01-05 DIAGNOSIS — I110 Hypertensive heart disease with heart failure: Secondary | ICD-10-CM | POA: Diagnosis not present

## 2021-01-05 DIAGNOSIS — E1140 Type 2 diabetes mellitus with diabetic neuropathy, unspecified: Secondary | ICD-10-CM | POA: Diagnosis not present

## 2021-01-05 DIAGNOSIS — L97529 Non-pressure chronic ulcer of other part of left foot with unspecified severity: Secondary | ICD-10-CM | POA: Diagnosis not present

## 2021-01-05 DIAGNOSIS — E11621 Type 2 diabetes mellitus with foot ulcer: Secondary | ICD-10-CM | POA: Diagnosis not present

## 2021-01-06 ENCOUNTER — Encounter (HOSPITAL_COMMUNITY): Payer: Self-pay

## 2021-01-06 ENCOUNTER — Ambulatory Visit (HOSPITAL_COMMUNITY): Payer: Medicare Other

## 2021-01-07 DIAGNOSIS — E113293 Type 2 diabetes mellitus with mild nonproliferative diabetic retinopathy without macular edema, bilateral: Secondary | ICD-10-CM | POA: Diagnosis not present

## 2021-01-07 DIAGNOSIS — E11621 Type 2 diabetes mellitus with foot ulcer: Secondary | ICD-10-CM | POA: Diagnosis not present

## 2021-01-07 DIAGNOSIS — L97529 Non-pressure chronic ulcer of other part of left foot with unspecified severity: Secondary | ICD-10-CM | POA: Diagnosis not present

## 2021-01-07 DIAGNOSIS — E1140 Type 2 diabetes mellitus with diabetic neuropathy, unspecified: Secondary | ICD-10-CM | POA: Diagnosis not present

## 2021-01-07 DIAGNOSIS — I110 Hypertensive heart disease with heart failure: Secondary | ICD-10-CM | POA: Diagnosis not present

## 2021-01-07 DIAGNOSIS — I5032 Chronic diastolic (congestive) heart failure: Secondary | ICD-10-CM | POA: Diagnosis not present

## 2021-01-10 DIAGNOSIS — I5032 Chronic diastolic (congestive) heart failure: Secondary | ICD-10-CM | POA: Diagnosis not present

## 2021-01-10 DIAGNOSIS — I110 Hypertensive heart disease with heart failure: Secondary | ICD-10-CM | POA: Diagnosis not present

## 2021-01-10 DIAGNOSIS — E113293 Type 2 diabetes mellitus with mild nonproliferative diabetic retinopathy without macular edema, bilateral: Secondary | ICD-10-CM | POA: Diagnosis not present

## 2021-01-10 DIAGNOSIS — L97529 Non-pressure chronic ulcer of other part of left foot with unspecified severity: Secondary | ICD-10-CM | POA: Diagnosis not present

## 2021-01-10 DIAGNOSIS — E11621 Type 2 diabetes mellitus with foot ulcer: Secondary | ICD-10-CM | POA: Diagnosis not present

## 2021-01-10 DIAGNOSIS — E1140 Type 2 diabetes mellitus with diabetic neuropathy, unspecified: Secondary | ICD-10-CM | POA: Diagnosis not present

## 2021-01-12 ENCOUNTER — Ambulatory Visit (INDEPENDENT_AMBULATORY_CARE_PROVIDER_SITE_OTHER): Payer: Medicare Other | Admitting: Internal Medicine

## 2021-01-12 ENCOUNTER — Other Ambulatory Visit: Payer: Self-pay

## 2021-01-12 ENCOUNTER — Encounter: Payer: Self-pay | Admitting: Internal Medicine

## 2021-01-12 VITALS — BP 116/75 | HR 95 | Resp 16 | Ht 64.0 in | Wt 169.0 lb

## 2021-01-12 DIAGNOSIS — I739 Peripheral vascular disease, unspecified: Secondary | ICD-10-CM

## 2021-01-12 DIAGNOSIS — M86172 Other acute osteomyelitis, left ankle and foot: Secondary | ICD-10-CM

## 2021-01-12 DIAGNOSIS — M869 Osteomyelitis, unspecified: Secondary | ICD-10-CM | POA: Insufficient documentation

## 2021-01-12 DIAGNOSIS — E1165 Type 2 diabetes mellitus with hyperglycemia: Secondary | ICD-10-CM | POA: Diagnosis not present

## 2021-01-12 MED ORDER — METRONIDAZOLE 500 MG PO TABS: 500.0000 mg | ORAL_TABLET | Freq: Two times a day (BID) | ORAL | 0 refills | Status: DC

## 2021-01-12 MED ORDER — DOXYCYCLINE HYCLATE 100 MG PO TABS: 100.0000 mg | ORAL_TABLET | Freq: Two times a day (BID) | ORAL | 0 refills | Status: AC

## 2021-01-12 NOTE — Assessment & Plan Note (Signed)
Status post intervention with vascular surgery on 12/22/20 to help improve blood flow and wound healing.  Continue to follow up with vascular surgery as planned.

## 2021-01-12 NOTE — Patient Instructions (Signed)
Thank you for coming to see me today. It was a pleasure seeing you.  To Do: [facility] today . Continue taking ciprofloxacin as prescribed  . Start taking doxycycline 100mg  twice daily on Friday . Start taking metronidazole 500mg  twice daily on Friday . On Friday at wound care, ask about the possibility of getting a bone culture if possible.  I will also reflect this request in my notes. . See me in about 4 weeks.  If you have any questions or concerns, please do not hesitate to call the office at [phone].  Take Care,   Jule Ser, DO

## 2021-01-12 NOTE — Assessment & Plan Note (Addendum)
She has MRI findings consistent with osteomyelitis and septic arthritis of the great toe due to non-healing ulcer.  This is in the setting peripheral vascular disease s/p recent intervention.  Discussed that likely her wound was not healing due to PAD and no antibiotics would have been effective if there was not adequate blood flow to her distal extremities.  Hopefully with the recent procedure this will help with healing along with continued wound care.  Discussed amputation vs antibiotics and they prefer to try medical therapy at this time.  Discussed PO vs IV antibiotics.  IV antibiotics would require daily infusions and PICC placement.  They may also need to place her in a skilled nursing home to provide this level of care and discussed that may not be in patients best interest at her advanced age in order to pursue more invasive treatment options.  They are agreeable to trial of PO therapy as some data emerging that PO therapy can be effective as IV to treat these infections.  Discussed her antibiotic allergies and unfortunately she reports significant diarrhea to Augmentin as I had thought doxycycline plus augmentin would provide nice coverage.  Instead will continue her cipro 589m BID that she has been on for GNR coverage.  Will add doxycycline and metronidazole for expanded GPC and anaerobic coverage.  Check baseline ESR/CRP today.  If wound care is able to obtain bone cultures on Friday that would be ideal otherwise will plan for empiric therapy.  RTC 4 weeks.  If decision to pursue IV therapy is needed would consider ceftriaxone, daptomycin, metronidazole or ertapenem/daptomycin as she may be harboring some resistant GNR after cipro suppression but she has no culture data to suggest this at this time.

## 2021-01-12 NOTE — Assessment & Plan Note (Signed)
Discussed the importance of glycemic control to continued wound healing.

## 2021-01-12 NOTE — Progress Notes (Signed)
[facility]  Reason for Consult: Osteomyelitis  Referring Provider: Dr Kalman Shan   HPI:    Heidi Dominguez is a 87 y.o. female with PMHx as below who presents to the clinic for further evaluation of osteomyelitis and septic arthritis of the left great toe.   She has been following with podiatry for a non-healing wound on her left great toe for about over 1 year.  Recently referred to [facility] after abnormal ABI and underwent intervention with stenting and angioplasty to help with blood flow.  She also follows with the wound center and recently had MRI done 12/29/20 with acute osteomyelitis of the proximal phalanx and distal phalanx of the left great toe and great toe IP joint effusion compatible with septic arthritis.  These findings were discussed with her wound care provider and they discussed antibiotics vs amputation.  They wish to avoid amputation at this time and would prefer medical management.  She has been on various courses of oral antibiotics over the past several months and has been on PO cipro BID since March 2020 for right shoulder PJI secondary to E coli (no cx from shoulder aspirate available but notes indicate sensitive E coli and blood cultures November 2019 with pan-senstive E coli from [facility] due to UTI).  She has no recent cultures from her left toe wound and has been off antibiotics for about 2 weeks.    Patient's Medications  New Prescriptions   METRONIDAZOLE (FLAGYL) 500 MG TABLET    Take 1 tablet (500 mg total) by mouth 2 (two) times daily.  Previous Medications   ACETAMINOPHEN (TYLENOL) 650 MG CR TABLET    Take 1,300 mg by mouth every 8 (eight) hours as needed for pain.   ALBUTEROL (VENTOLIN HFA) 108 (90 BASE) MCG/ACT INHALER    Inhale 2 puffs into the lungs every 6 (six) hours as needed for wheezing or shortness of breath.   AMLODIPINE (NORVASC) 10 MG TABLET    Take 10 mg by mouth daily.   ASPIRIN EC 81 MG TABLET    Take 81 mg by mouth  daily.   CARBOXYMETHYLCELLULOSE SOD PF (REFRESH PLUS) 0.5 % SOLN    Place 1 drop into both eyes daily as needed (eye injections).   CHOLECALCIFEROL (VITAMIN D-3) 125 MCG (5000 UT) TABS    Take 5,000 Units by mouth every Monday.   CIPROFLOXACIN (CIPRO) 500 MG TABLET    Take 500 mg by mouth every 12 (twelve) hours.   CLOPIDOGREL (PLAVIX) 75 MG TABLET    Take 1 tablet (75 mg total) by mouth daily.   COLCHICINE 0.6 MG TABLET    Take 1 tablet (0.6 mg total) by mouth daily.   FAMOTIDINE (PEPCID) 20 MG TABLET    Take one after  supper   FENTANYL (DURAGESIC) 25 MCG/HR    Place 1 patch onto the skin every 3 (three) days.   FERROUS SULFATE 325 (65 FE) MG TABLET    Take 325 mg by mouth at bedtime.   FUROSEMIDE (LASIX) 20 MG TABLET    Take 20 mg by mouth daily.   JANUVIA 50 MG TABLET    Take 50 mg by mouth daily.    LEVALBUTEROL (XOPENEX) 1.25 MG/3ML NEBULIZER SOLUTION    Take 1.25 mg by nebulization every 4 (four) hours as needed for shortness of breath or wheezing.   LIDOCAINE (XYLOCAINE) 5 % OINTMENT    APPLY TOPICALLY AS NEEDED. TOES FOR PAIN   LORATADINE (  CLARITIN) 10 MG TABLET    1 tablet   LOSARTAN (COZAAR) 25 MG TABLET    Take 25 mg by mouth daily.   LOSARTAN (COZAAR) 50 MG TABLET    TAKE 1 TABLET(50 MG) BY MOUTH DAILY   MAGNESIUM 250 MG TABS       MULTIPLE VITAMIN (MULTIVITAMIN WITH MINERALS) TABS TABLET    Take 1 tablet by mouth daily.   MULTIPLE VITAMINS-MINERALS (IMMUNE SUPPORT PO)    Take 1 tablet by mouth daily.   OMEGA 3 1000 MG CAPS    1 capsule   ONETOUCH VERIO TEST STRIP    USE TO CHECK BLOOD SUGAR ONCE DAILY.   PANTOPRAZOLE (PROTONIX) 40 MG TABLET    Take 1 tablet (40 mg total) by mouth daily. Take 30-60 min before first meal of the day   POTASSIUM CHLORIDE (K-DUR) 10 MEQ TABLET    Take 10 mEq by mouth daily.   SYSTANE COMPLETE 0.6 % SOLN    Place 2 drops into both eyes 2 (two) times daily.   VITAMIN B-12 (CYANOCOBALAMIN) 1000 MCG TABLET    Take 1,000 mcg by mouth every Sunday.    WOUND DRESSINGS (FIBRACOL EX)    Apply 1 patch topically every other day.   WOUND DRESSINGS (PROMOGRAN EX)    Apply 1 patch topically every other day.  Modified Medications   Modified Medication Previous Medication   DOXYCYCLINE (VIBRA-TABS) 100 MG TABLET doxycycline (VIBRA-TABS) 100 MG tablet      Take 1 tablet (100 mg total) by mouth 2 (two) times daily.    Take 1 tablet (100 mg total) by mouth 2 (two) times daily.  Discontinued Medications   CETIRIZINE (ZYRTEC) 10 MG TABLET    Take 10 mg by mouth at bedtime.      Past Medical History:  Diagnosis Date  . Acute pancreatitis    hx of   . Arthritis    "qwhere" (07/04/2018)  . Chronic back pain    "all over" (07/04/2018)  . Chronic diastolic CHF (congestive heart failure) ([place])   . Chronic kidney disease    stage  3 chronic kidney disease   . Complication of anesthesia    "I have a hard time waking up"  . Degenerative joint disease of shoulder region   . Dry eye syndrome   . Dyspnea   . Family history of adverse reaction to anesthesia    "daughter has the shakes when she wakes up"  . GERD (gastroesophageal reflux disease)   . Heart murmur   . High cholesterol   . History of hiatal hernia   . HOH (hard of hearing)   . Hypertension   . Impacted cerumen of both ears    hx of   . Muscle weakness (generalized)   . Osteoarthritis   . Overactive bladder   . Pancreatitis    hx of   . Phlebitis    "BLE"  . Rotator cuff tear    right   . Tear of right supraspinatus tendon   . Thrombocytopenia ([facility])    hx of   . Type II diabetes mellitus ([place])   . Urinary tract infection    hx of   . Xerosis cutis    hx of     Social History   Tobacco Use  . Smoking status: Never Smoker  . Smokeless tobacco: Never Used  Vaping Use  . Vaping Use: Never used  Substance Use Topics  . Alcohol use: No  Comment: rare  . Drug use: No    Family History  Problem Relation Age of Onset  . Hypertension Mother   . Diabetes Mellitus I  Mother   . Hypertension Father     Allergies  Allergen Reactions  . Amoxicillin-Pot Clavulanate Diarrhea  . Catapres [Clonidine Hcl] Other (See Comments)    Unknown.   . Codeine Other (See Comments)    "Makes me out of my head, loopy"  . Hydrocodone Other (See Comments)    "makes me go out of my head, loopy"  . Lisinopril Cough  . Other Other (See Comments)    Any type of narcotics Feels "loopy"   . Sulfa Antibiotics Itching and Other (See Comments)  . Sulfamethoxazole Diarrhea  . Verapamil Diarrhea and Other (See Comments)  . Sulfasalazine Itching    Review of Systems  Constitutional: Negative.   Respiratory: Negative.   Cardiovascular: Negative.   Gastrointestinal: Negative.   Musculoskeletal: Positive for joint pain.       + toe wound      OBJECTIVE:    Vitals:   01/12/21 1456  BP: 116/75  Pulse: 95  Resp: 16  SpO2: 97%  Weight: 169 lb (76.7 kg)  Height: '5\' 4"'  (1.626 m)     Body mass index is 29.01 kg/m.  Physical Exam Constitutional:      General: She is not in acute distress.    Appearance: Normal appearance.  HENT:     Head: Normocephalic and atraumatic.  Pulmonary:     Effort: Pulmonary effort is normal. No respiratory distress.  Musculoskeletal:     Comments: Left great toe unwrapped.  Ulcer noted to left great toe with some serous drainage.  No significant purulence, no tenderness to palpation.  Skin:    General: Skin is warm and dry.     Findings: No rash.  Neurological:     General: No focal deficit present.     Mental Status: She is alert and oriented to person, place, and time.  Psychiatric:        Mood and Affect: Mood normal.        Behavior: Behavior normal.      Labs and Microbiology:  CBC Latest Ref Rng & Units 12/22/2020 12/22/2020 11/02/2019  WBC 4.0 - 10.5 K/uL 8.9 - 7.8  Hemoglobin 12.0 - 15.0 g/dL 14.2 16.7(H) 14.8  Hematocrit 36.0 - 46.0 % 44.8 49.0(H) 48.0(H)  Platelets 150 - 400 K/uL 208 - 170   CMP Latest Ref Rng  & Units 12/22/2020 12/22/2020 11/02/2019  Glucose 70 - 99 mg/dL - 135(H) 231(H)  BUN 8 - 23 mg/dL - 16 20  Creatinine 0.44 - 1.00 mg/dL 0.74 0.90 0.86  Sodium 135 - 145 mmol/L - 140 136  Potassium 3.5 - 5.1 mmol/L - 3.9 4.0  Chloride 98 - 111 mmol/L - 98 101  CO2 22 - 32 mmol/L - - 24  Calcium 8.9 - 10.3 mg/dL - - 9.1  Total Protein 6.5 - 8.1 g/dL - - 7.0  Total Bilirubin 0.3 - 1.2 mg/dL - - 0.6  Alkaline Phos 38 - 126 U/L - - 98  AST 15 - 41 U/L - - 22  ALT 0 - 44 U/L - - 12     No results found for this or any previous visit (from the past 240 hour(s)).  Imaging: IMPRESSION: 1. Acute osteomyelitis of the proximal phalanx and distal phalanx of the left great toe. Great toe IP joint effusion compatible with septic arthritis.  2. Moderate hallux valgus deformity with osteoarthritis of the first MTP joint. Trace first MTP joint effusion, which may be reactive. Septic arthritis at this location is not excluded. 3. Mild patchy bone marrow edema within the intermediate cuneiform with subtle linear low signal component suspicious for developing fracture.   ASSESSMENT & PLAN:    Peripheral artery disease ([place]) Status post intervention with vascular surgery on 12/22/20 to help improve blood flow and wound healing.  Continue to follow up with vascular surgery as planned.   Diabetes mellitus type 2, controlled ([place]) Discussed the importance of glycemic control to continued wound healing.   Osteomyelitis ([place]) She has MRI findings consistent with osteomyelitis and septic arthritis of the great toe due to non-healing ulcer.  This is in the setting peripheral vascular disease s/p recent intervention.  Discussed that likely her wound was not healing due to PAD and no antibiotics would have been effective if there was not adequate blood flow to her distal extremities.  Hopefully with the recent procedure this will help with healing along with continued wound care.  Discussed amputation vs  antibiotics and they prefer to try medical therapy at this time.  Discussed PO vs IV antibiotics.  IV antibiotics would require daily infusions and PICC placement.  They may also need to place her in a skilled nursing home to provide this level of care and discussed that may not be in patients best interest at her advanced age in order to pursue more invasive treatment options.  They are agreeable to trial of PO therapy as some data emerging that PO therapy can be effective as IV to treat these infections.  Discussed her antibiotic allergies and unfortunately she reports significant diarrhea to Augmentin as I had thought doxycycline plus augmentin would provide nice coverage.  Instead will continue her cipro 523m BID that she has been on for GNR coverage.  Will add doxycycline and metronidazole for expanded GPC and anaerobic coverage.  Check baseline ESR/CRP today.  If wound care is able to obtain bone cultures on Friday that would be ideal otherwise will plan for empiric therapy.  RTC 4 weeks.  If decision to pursue IV therapy is needed would consider ceftriaxone, daptomycin, metronidazole or ertapenem/daptomycin as she may be harboring some resistant GNR after cipro suppression but she has no culture data to suggest this at this time.    Orders Placed This Encounter  Procedures  . CBC  . COMPLETE METABOLIC PANEL WITH GFR  . Sedimentation rate  . C-reactive protein     [facility] 01/12/2021, 4:11 PM   I spent 60 minutes dedicated to the care of this patient on the date of this encounter to include pre-visit review of records, face-to-face time with the patient discussing osteo, pvd, dm, and post-visit ordering of testing.

## 2021-01-13 DIAGNOSIS — E11621 Type 2 diabetes mellitus with foot ulcer: Secondary | ICD-10-CM | POA: Diagnosis not present

## 2021-01-13 DIAGNOSIS — E1140 Type 2 diabetes mellitus with diabetic neuropathy, unspecified: Secondary | ICD-10-CM | POA: Diagnosis not present

## 2021-01-13 DIAGNOSIS — I5032 Chronic diastolic (congestive) heart failure: Secondary | ICD-10-CM | POA: Diagnosis not present

## 2021-01-13 DIAGNOSIS — L97529 Non-pressure chronic ulcer of other part of left foot with unspecified severity: Secondary | ICD-10-CM | POA: Diagnosis not present

## 2021-01-13 DIAGNOSIS — E113293 Type 2 diabetes mellitus with mild nonproliferative diabetic retinopathy without macular edema, bilateral: Secondary | ICD-10-CM | POA: Diagnosis not present

## 2021-01-13 DIAGNOSIS — I110 Hypertensive heart disease with heart failure: Secondary | ICD-10-CM | POA: Diagnosis not present

## 2021-01-13 LAB — COMPLETE METABOLIC PANEL WITH GFR
AG Ratio: 1.2 (calc) (ref 1.0–2.5)
ALT: 12 U/L (ref 6–29)
AST: 21 U/L (ref 10–35)
Albumin: 4.1 g/dL (ref 3.6–5.1)
Alkaline phosphatase (APISO): 84 U/L (ref 37–153)
BUN/Creatinine Ratio: 12 (calc) (ref 6–22)
BUN: 14 mg/dL (ref 7–25)
CO2: 28 mmol/L (ref 20–32)
Calcium: 10.6 mg/dL — ABNORMAL HIGH (ref 8.6–10.4)
Chloride: 96 mmol/L — ABNORMAL LOW (ref 98–110)
Creat: 1.17 mg/dL — ABNORMAL HIGH (ref 0.60–0.88)
GFR, Est African American: 45 mL/min/{1.73_m2} — ABNORMAL LOW (ref >=60)
GFR, Est Non African American: 39 mL/min/{1.73_m2} — ABNORMAL LOW (ref >=60)
Globulin: 3.5 g/dL (calc) (ref 1.9–3.7)
Glucose, Bld: 138 mg/dL — ABNORMAL HIGH (ref 65–99)
Potassium: 4.8 mmol/L (ref 3.5–5.3)
Sodium: 137 mmol/L (ref 135–146)
Total Bilirubin: 0.5 mg/dL (ref 0.2–1.2)
Total Protein: 7.6 g/dL (ref 6.1–8.1)

## 2021-01-13 LAB — CBC
HCT: 49.3 % — ABNORMAL HIGH (ref 35.0–45.0)
Hemoglobin: 15.4 g/dL (ref 11.7–15.5)
MCH: 28.2 pg (ref 27.0–33.0)
MCHC: 31.2 g/dL — ABNORMAL LOW (ref 32.0–36.0)
MCV: 90.1 fL (ref 80.0–100.0)
MPV: 12.3 fL (ref 7.5–12.5)
Platelets: 195 10*3/uL (ref 140–400)
RBC: 5.47 10*6/uL — ABNORMAL HIGH (ref 3.80–5.10)
RDW: 12.7 % (ref 11.0–15.0)
WBC: 10 10*3/uL (ref 3.8–10.8)

## 2021-01-13 LAB — C-REACTIVE PROTEIN: CRP: 10.9 mg/L — ABNORMAL HIGH (ref <8.0)

## 2021-01-13 LAB — SEDIMENTATION RATE: Sed Rate: 19 mm/h (ref 0–30)

## 2021-01-14 ENCOUNTER — Other Ambulatory Visit (HOSPITAL_COMMUNITY)
Admission: RE | Admit: 2021-01-14 | Discharge: 2021-01-14 | Disposition: A | Discharge status: Home / Self Care | Payer: Medicare Other | Source: Other Acute Inpatient Hospital | Attending: Internal Medicine | Admitting: Internal Medicine

## 2021-01-14 ENCOUNTER — Other Ambulatory Visit: Payer: Self-pay

## 2021-01-14 ENCOUNTER — Telehealth: Payer: Self-pay

## 2021-01-14 ENCOUNTER — Encounter (HOSPITAL_BASED_OUTPATIENT_CLINIC_OR_DEPARTMENT_OTHER): Payer: Medicare Other | Attending: Internal Medicine | Admitting: Internal Medicine

## 2021-01-14 DIAGNOSIS — N183 Chronic kidney disease, stage 3 unspecified: Secondary | ICD-10-CM | POA: Diagnosis not present

## 2021-01-14 DIAGNOSIS — E1151 Type 2 diabetes mellitus with diabetic peripheral angiopathy without gangrene: Secondary | ICD-10-CM | POA: Diagnosis not present

## 2021-01-14 DIAGNOSIS — M869 Osteomyelitis, unspecified: Secondary | ICD-10-CM | POA: Insufficient documentation

## 2021-01-14 DIAGNOSIS — E1122 Type 2 diabetes mellitus with diabetic chronic kidney disease: Secondary | ICD-10-CM | POA: Insufficient documentation

## 2021-01-14 DIAGNOSIS — I130 Hypertensive heart and chronic kidney disease with heart failure and stage 1 through stage 4 chronic kidney disease, or unspecified chronic kidney disease: Secondary | ICD-10-CM | POA: Diagnosis not present

## 2021-01-14 DIAGNOSIS — L97529 Non-pressure chronic ulcer of other part of left foot with unspecified severity: Secondary | ICD-10-CM | POA: Diagnosis not present

## 2021-01-14 DIAGNOSIS — L97526 Non-pressure chronic ulcer of other part of left foot with bone involvement without evidence of necrosis: Secondary | ICD-10-CM

## 2021-01-14 DIAGNOSIS — E11621 Type 2 diabetes mellitus with foot ulcer: Secondary | ICD-10-CM | POA: Diagnosis not present

## 2021-01-14 DIAGNOSIS — I5032 Chronic diastolic (congestive) heart failure: Secondary | ICD-10-CM | POA: Diagnosis not present

## 2021-01-14 DIAGNOSIS — Z833 Family history of diabetes mellitus: Secondary | ICD-10-CM | POA: Insufficient documentation

## 2021-01-14 NOTE — Telephone Encounter (Signed)
-----  Message from Mignon Pine, DO sent at 01/14/2021  8:43 AM EDT ----- Please let patient know that labs look okay.  WBC is normal, creatinine is up a little so she should stay well hydrated.  Her inflammatory markers showed ESR normal and CRP just barely above normal which is expected with an open ulcer on her foot.  THanks, Mitzi Hansen

## 2021-01-14 NOTE — Telephone Encounter (Signed)
Spoke with patient's daughter to relay results per Dr. Juleen China. Advised her that WBC count is normal, but that her creatinine is a little high and patient should try to stay well hydrated. Also relayed that patient's inflammatory markers are mildly elevated which is to be expected. Patient's daughter verbalized understanding and has no further questions.   She states the plan is for the patient to see the wound center today and possibly have a biopsy done and to start the antibiotics today as well.   [name], RN

## 2021-01-14 NOTE — Progress Notes (Signed)
MARCHE, HOTTENSTEIN ([id number]) Visit Report for 01/14/2021 Chief Complaint Document Details Patient Name: Date of Service: Heidi NTA GUE, [name] 01/14/2021 10:30 A M Medical Record Number: [id number] Patient Account Number: [id number] Date of Birth/Sex: Treating RN: [birth year] (87 y.o. Heidi Dominguez Primary Care Provider: Lajean Manes T Other Clinician: Referring Provider: Treating Provider/Extender: [name] Weeks in Treatment: 4 Information Obtained from: Patient Chief Complaint Left great toe wound Electronic Signature(s) Signed: 01/14/2021 11:43:52 AM By: Kalman Shan DO Entered By: Kalman Shan on 01/14/2021 11:37:27 -------------------------------------------------------------------------------- Debridement Details Patient Name: Date of Service: Heidi NTA GUE, [name] 01/14/2021 10:30 A M Medical Record Number: [id number] Patient Account Number: [id number] Date of Birth/Sex: Treating RN: February 20, 1921 (87 y.o. Heidi Dominguez Primary Care Provider: Lajean Manes T Other Clinician: Referring Provider: Treating Provider/Extender: [name] in Treatment: 4 Debridement Performed for Assessment: Wound #1 Left T Great oe Performed By: Physician Kalman Shan, DO Debridement Type: Debridement Severity of Tissue Pre Debridement: Bone involvement without necrosis Level of Consciousness (Pre-procedure): Awake and Alert Pre-procedure Verification/Time Out Yes - 11:29 Taken: Start Time: 11:30 Pain Control: Lidocaine 4% T opical Solution T Area Debrided (L x W): otal 1 (cm) x 0.3 (cm) = 0.3 (cm) Tissue and other material debrided: Non-Viable, Bone Level: Skin/Subcutaneous Tissue/Muscle/Bone Debridement Description: Excisional Instrument: Rongeur Specimen: Tissue Culture Number of Specimens T aken: 1 Bleeding: Moderate Hemostasis Achieved: Silver Nitrate End Time: 11:35 Response to Treatment: Procedure was tolerated  well Level of Consciousness (Post- Awake and Alert procedure): Post Debridement Measurements of Total Wound Length: (cm) 0.8 Width: (cm) 1.3 Depth: (cm) 0.3 Volume: (cm) 0.245 Character of Wound/Ulcer Post Debridement: Stable Severity of Tissue Post Debridement: Bone involvement without necrosis Post Procedure Diagnosis Same as Pre-procedure Electronic Signature(s) Signed: 01/14/2021 11:37:44 AM By: Lorrin Jackson Signed: 01/14/2021 11:43:52 AM By: Kalman Shan DO Entered By: Lorrin Jackson on 01/14/2021 11:37:43 -------------------------------------------------------------------------------- HPI Details Patient Name: Date of Service: Heidi NTA GUE, [name] 01/14/2021 10:30 A M Medical Record Number: [id number] Patient Account Number: [id number] Date of Birth/Sex: Treating RN: Jun 10, 1921 (87 y.o. Heidi Dominguez Primary Care Provider: Lajean Manes T Other Clinician: Referring Provider: Treating Provider/Extender: [name] in Treatment: 4 History of Present Illness HPI Description: Admission 5/5 Heidi Dominguez is a 87 year old female with a past medical history of type 2 diabetes on oral agents, essential hypertension, chronic diastolic congestive heart failure and CKD 3 that presents to our office for a diabetic foot wound located to the left first great toe. Patient is accompanied by daughter and caregiver who helps provide the history. The wound started 1 year ago and has not shown signs of improvement. In fact they state that for the past month it has gotten worse. She has been following with podiatry for this issue. She does not report having an image done of this foot. She states that recently she was placed on doxycycline because a home health nurse was concerned about an infection. She is using collagen every other day to the wound site. She currently denies any fever/chills, purulent drainage, increased warmth or erythema to the foot. 5/9;  patient presents for 1 week follow-up. She states she feels well. She has been using collagen for dressing changes with no issues. She denies any acute signs of infection. She denies fever/chills, nausea/vomiting or drainage From the wound. She is scheduled for an MRI on 5/18 5/20; patient presents for 1 week follow-up. She  has been using collagen dressing changes with no issues. She had the MRI of her foot done on 5/18. She denies any signs of local or systemic infection. 6/3; patient presents for 2-week follow-up. She saw infectious disease and was started on oral antibiotics. She currently denies signs of infection and overall feels well. She has been using collagen to the wound. She has no issues or complaints today. Electronic Signature(s) Signed: 01/14/2021 11:43:52 AM By: Kalman Shan DO Entered By: Kalman Shan on 01/14/2021 11:37:57 -------------------------------------------------------------------------------- Physical Exam Details Patient Name: Date of Service: Heidi NTA GUE, [name] 01/14/2021 10:30 A M Medical Record Number: [id number] Patient Account Number: [id number] Date of Birth/Sex: Treating RN: [birth year] (87 y.o. Heidi Dominguez Primary Care Provider: Lajean Manes T Other Clinician: Referring Provider: Treating Provider/Extender: [name] in Treatment: 4 Constitutional respirations regular, non-labored and within target range for patient.. Cardiovascular 2+ dorsalis pedis/posterior tibialis pulses. Psychiatric pleasant and cooperative. Notes Left great toe: Open wound with probing to the bone. There is granulation tissue. No acute signs of soft tissue infection including purulent drainage increased warmth or erythema to the foot. Electronic Signature(s) Signed: 01/14/2021 11:43:52 AM By: Kalman Shan DO Entered By: Kalman Shan on 01/14/2021  11:38:44 -------------------------------------------------------------------------------- Physician Orders Details Patient Name: Date of Service: Heidi NTA GUE, [name] 01/14/2021 10:30 A M Medical Record Number: [id number] Patient Account Number: [id number] Date of Birth/Sex: Treating RN: Dec 25, 1920 (87 y.o. Heidi Dominguez Primary Care Provider: Lajean Manes T Other Clinician: Referring Provider: Treating Provider/Extender: [name] Weeks in Treatment: 4 Verbal / Phone Orders: No Diagnosis Coding ICD-10 Coding Code Description L97.529 Non-pressure chronic ulcer of other part of left foot with unspecified severity M86.9 Osteomyelitis, unspecified E11.621 Type 2 diabetes mellitus with foot ulcer I10 Essential (primary) hypertension S01.09 Chronic diastolic (congestive) heart failure Follow-up Appointments Return appointment in 3 weeks. - with Dr. Heber [place] Nurse Visit: - in 2 weeks Bathing/ Shower/ Hygiene May shower and wash wound with soap and water. Edema Control - Lymphedema / SCD / Other Elevate legs to the level of the heart or above for 30 minutes daily and/or when sitting, a frequency of: - throughout the day Moisturize legs daily. Off-Loading Other: - Shoes on only when up walking, do not walk around bare foot Additional Orders / Instructions Follow Nutritious Diet - 100-120g of Protein Non Wound Condition Protect area with: - Right 5th toe with foam padding Home Health No change in wound care orders this week; continue Home Health for wound care. May utilize formulary equivalent dressing for wound treatment orders unless otherwise specified. Other Home Health Orders/Instructions: - [facility] Wound Treatment Wound #1 - T Great oe Wound Laterality: Left Cleanser: Normal Saline (Home Health) Every Other Day/15 Days Discharge Instructions: Cleanse the wound with Normal Saline or wound cleanser prior to applying a clean dressing using gauze  sponges, not tissue or cotton balls. Cleanser: Soap and Water [facility]) Every Other Day/15 Days Discharge Instructions: May shower and wash wound with dial antibacterial soap and water prior to dressing change. Prim Dressing: FIBRACOL Plus Dressing, 2x2 in (collagen) (Home Health) Every Other Day/15 Days ary Discharge Instructions: Moisten collagen with saline or hydrogel Secondary Dressing: Woven Gauze Sponge, Non-Sterile 4x4 in [facility]) Every Other Day/15 Days Discharge Instructions: Apply over primary dressing as directed. Secondary Dressing: Felt 2.5 yds x 5.5 in [facility]) Every Other Day/15 Days Discharge Instructions: Apply as donut over primary dressing Secured With: Conforming Stretch  Gauze Bandage, Sterile 2x75 (in/in) (Home Health) Every Other Day/15 Days Discharge Instructions: Secure with stretch gauze as directed. Secured With: 37M Medipore H Soft Cloth Surgical T 4 x 2 (in/yd) (Home Health) Every Other Day/15 Days ape Discharge Instructions: Secure dressing with tape as directed. Laboratory erobe culture (MICRO) - Bone Culture Bacteria identified in Unspecified specimen by A LOINC Code: 500-9 Convenience Name: Areobic culture-specimen not specified Electronic Signature(s) Signed: 01/14/2021 11:43:52 AM By: Kalman Shan DO Signed: 01/14/2021 6:03:14 PM By: Lorrin Jackson Entered By: Lorrin Jackson on 01/14/2021 11:39:28 -------------------------------------------------------------------------------- Problem List Details Patient Name: Date of Service: Heidi NTA GUE, [name] 01/14/2021 10:30 A M Medical Record Number: [id number] Patient Account Number: [id number] Date of Birth/Sex: Treating RN: 07/27/1921 (87 y.o. Heidi Dominguez Primary Care Provider: Lajean Manes T Other Clinician: Referring Provider: Treating Provider/Extender: [name] Weeks in Treatment: 4 Active Problems ICD-10 Encounter Code Description Active Date  MDM Diagnosis L97.529 Non-pressure chronic ulcer of other part of left foot with unspecified severity 12/16/2020 No Yes M86.9 Osteomyelitis, unspecified 12/31/2020 No Yes E11.621 Type 2 diabetes mellitus with foot ulcer 12/16/2020 No Yes I10 Essential (primary) hypertension 12/16/2020 No Yes J69.67 Chronic diastolic (congestive) heart failure 12/16/2020 No Yes Inactive Problems Resolved Problems Electronic Signature(s) Signed: 01/14/2021 11:43:52 AM By: Kalman Shan DO Entered By: Kalman Shan on 01/14/2021 11:36:21 -------------------------------------------------------------------------------- Progress Note Details Patient Name: Date of Service: Heidi NTA GUE, [name] 01/14/2021 10:30 A M Medical Record Number: [id number] Patient Account Number: [id number] Date of Birth/Sex: Treating RN: [birth year] (87 y.o. Heidi Dominguez Primary Care Provider: Lajean Manes T Other Clinician: Referring Provider: Treating Provider/Extender: [name] Weeks in Treatment: 4 Subjective Chief Complaint Information obtained from Patient Left great toe wound History of Present Illness (HPI) Admission 5/5 Ms. Burbano is a 87 year old female with a past medical history of type 2 diabetes on oral agents, essential hypertension, chronic diastolic congestive heart failure and CKD 3 that presents to our office for a diabetic foot wound located to the left first great toe. Patient is accompanied by daughter and caregiver who helps provide the history. The wound started 1 year ago and has not shown signs of improvement. In fact they state that for the past month it has gotten worse. She has been following with podiatry for this issue. She does not report having an image done of this foot. She states that recently she was placed on doxycycline because a home health nurse was concerned about an infection. She is using collagen every other day to the wound site. She currently denies any  fever/chills, purulent drainage, increased warmth or erythema to the foot. 5/9; patient presents for 1 week follow-up. She states she feels well. She has been using collagen for dressing changes with no issues. She denies any acute signs of infection. She denies fever/chills, nausea/vomiting or drainage From the wound. She is scheduled for an MRI on 5/18 5/20; patient presents for 1 week follow-up. She has been using collagen dressing changes with no issues. She had the MRI of her foot done on 5/18. She denies any signs of local or systemic infection. 6/3; patient presents for 2-week follow-up. She saw infectious disease and was started on oral antibiotics. She currently denies signs of infection and overall feels well. She has been using collagen to the wound. She has no issues or complaints today. Patient History Information obtained from Patient. Family History Cancer - Siblings, Diabetes - Mother,Maternal Grandparents, Heart Disease -  Mother, Hypertension - Mother, Stroke - Siblings, No family history of Hereditary Spherocytosis, Kidney Disease, Lung Disease, Seizures, Thyroid Problems, Tuberculosis. Social History Never smoker, Marital Status - Widowed, Alcohol Use - Never, Drug Use - No History, Caffeine Use - Daily. Medical History Cardiovascular Patient has history of Congestive Heart Failure, Hypertension, Peripheral Arterial Disease, Phlebitis Endocrine Patient has history of Type II Diabetes Musculoskeletal Patient has history of Osteoarthritis Medical A Surgical History Notes nd Eyes Degenerative Eye Disease Respiratory Uses inhaler Gastrointestinal Hiatal Hernia Genitourinary CKD stage 3-Incontinence Objective Constitutional respirations regular, non-labored and within target range for patient.. Vitals Time Taken: 11:05 AM, Height: 64 in, Source: Stated, Weight: 166 lbs, Source: Stated, BMI: 28.5, Temperature: 98.4 F, Pulse: 87 bpm, Respiratory Rate: 18 breaths/min,  Blood Pressure: 145/85 mmHg, Capillary Blood Glucose: 146 mg/dl. General Notes: GLUCOSE PER PT REPORT this am Cardiovascular 2+ dorsalis pedis/posterior tibialis pulses. Psychiatric pleasant and cooperative. General Notes: Left great toe: Open wound with probing to the bone. There is granulation tissue. No acute signs of soft tissue infection including purulent drainage increased warmth or erythema to the foot. Integumentary (Hair, Skin) Wound #1 status is Open. Original cause of wound was Other Lesion. The date acquired was: 11/24/2019. The wound has been in treatment 4 weeks. The wound is located on the Left T Great. The wound measures 0.8cm length x 1.3cm width x 0.3cm depth; 0.817cm^2 area and 0.245cm^3 volume. There is bone and oe Fat Layer (Subcutaneous Tissue) exposed. There is no tunneling or undermining noted. There is a medium amount of serosanguineous drainage noted. The wound margin is well defined and not attached to the wound base. There is large (67-100%) red granulation within the wound bed. There is no necrotic tissue within the wound bed. Assessment Active Problems ICD-10 Non-pressure chronic ulcer of other part of left foot with unspecified severity Osteomyelitis, unspecified Type 2 diabetes mellitus with foot ulcer Essential (primary) hypertension Chronic diastolic (congestive) heart failure Patient's wound is stable with no acute signs of infection. She saw Dr. Juleen China with infectious disease and was started on doxycycline and metronidazole for osteomyelitis. They would like a bone culture and this was done today by Dr. Dellia Nims and myself. Since everything is stable and she is on treatment she can be seen in 2 weeks for a nurse visit and then the following week with me. Procedures Wound #1 Pre-procedure diagnosis of Wound #1 is a Diabetic Wound/Ulcer of the Lower Extremity located on the Left T Great .Severity of Tissue Pre Debridement is: oe Bone involvement without  necrosis. There was a Excisional Skin/Subcutaneous Tissue/Muscle/Bone Debridement with a total area of 0.3 sq cm performed by Kalman Shan, DO. With the following instrument(s): Rongeur to remove Non-Viable tissue/material. Material removed includes Bone after achieving pain control using Lidocaine 4% T opical Solution. 1 specimen was taken by a Tissue Culture and sent to the lab per facility protocol. A time out was conducted at 11:29, prior to the start of the procedure. A Moderate amount of bleeding was controlled with Silver Nitrate. The procedure was tolerated well. Post Debridement Measurements: 0.8cm length x 1.3cm width x 0.3cm depth; 0.245cm^3 volume. Character of Wound/Ulcer Post Debridement is stable. Severity of Tissue Post Debridement is: Bone involvement without necrosis. Post procedure Diagnosis Wound #1: Same as Pre-Procedure Plan Follow-up Appointments: Return appointment in 3 weeks. - with Dr. Heber [place] Nurse Visit: - in 2 weeks Bathing/ Shower/ Hygiene: May shower and wash wound with soap and water. Edema Control - Lymphedema / SCD /  Other: Elevate legs to the level of the heart or above for 30 minutes daily and/or when sitting, a frequency of: - throughout the day Moisturize legs daily. Off-Loading: Other: - Shoes on only when up walking, do not walk around bare foot Additional Orders / Instructions: Follow Nutritious Diet - 100-120g of Protein Home Health: No change in wound care orders this week; continue Home Health for wound care. May utilize formulary equivalent dressing for wound treatment orders unless otherwise specified. Other Home Health Orders/Instructions: - [facility] WOUND #1: - T Great Wound Laterality: Left oe Cleanser: Normal Saline (Home Health) Every Other Day/15 Days Discharge Instructions: Cleanse the wound with Normal Saline or wound cleanser prior to applying a clean dressing using gauze sponges, not tissue or cotton balls. Cleanser: Soap and  Water [facility]) Every Other Day/15 Days Discharge Instructions: May shower and wash wound with dial antibacterial soap and water prior to dressing change. Prim Dressing: FIBRACOL Plus Dressing, 2x2 in (collagen) (Home Health) Every Other Day/15 Days ary Discharge Instructions: Moisten collagen with saline or hydrogel Secondary Dressing: Woven Gauze Sponge, Non-Sterile 4x4 in [facility]) Every Other Day/15 Days Discharge Instructions: Apply over primary dressing as directed. Secondary Dressing: Felt 2.5 yds x 5.5 in [facility]) Every Other Day/15 Days Discharge Instructions: Apply as donut over primary dressing Secured With: Conforming Stretch Gauze Bandage, Sterile 2x75 (in/in) (Home Health) Every Other Day/15 Days Discharge Instructions: Secure with stretch gauze as directed. Secured With: 23M Medipore H Soft Cloth Surgical T 4 x 2 (in/yd) (Home Health) Every Other Day/15 Days ape Discharge Instructions: Secure dressing with tape as directed. 1. Bone culture 2. Continue collagen 3. Follow-up in 2 weeks for nurse visit and the following week with me Electronic Signature(s) Signed: 01/14/2021 11:54:41 AM By: Kalman Shan DO Previous Signature: 01/14/2021 11:43:52 AM Version By: Kalman Shan DO Entered By: Kalman Shan on 01/14/2021 11:54:17 -------------------------------------------------------------------------------- HxROS Details Patient Name: Date of Service: Heidi NTA GUE, [name] 01/14/2021 10:30 A M Medical Record Number: [id number] Patient Account Number: [id number] Date of Birth/Sex: Treating RN: 1921-05-05 (87 y.o. Heidi Dominguez Primary Care Provider: Lajean Manes T Other Clinician: Referring Provider: Treating Provider/Extender: [name] Weeks in Treatment: 4 Information Obtained From Patient Eyes Medical History: Past Medical History Notes: Degenerative Eye Disease Respiratory Medical History: Past Medical History  Notes: Uses inhaler Cardiovascular Medical History: Positive for: Congestive Heart Failure; Hypertension; Peripheral Arterial Disease; Phlebitis Gastrointestinal Medical History: Past Medical History Notes: Hiatal Hernia Endocrine Medical History: Positive for: Type II Diabetes Time with diabetes: 30+ years Treated with: Oral agents Blood sugar tested every day: Yes Tested : QOD Genitourinary Medical History: Past Medical History Notes: CKD stage 3-Incontinence Musculoskeletal Medical History: Positive for: Osteoarthritis Immunizations Pneumococcal Vaccine: Received Pneumococcal Vaccination: Yes Implantable Devices None Family and Social History Cancer: Yes - Siblings; Diabetes: Yes - Mother,Maternal Grandparents; Heart Disease: Yes - Mother; Hereditary Spherocytosis: No; Hypertension: Yes - Mother; Kidney Disease: No; Lung Disease: No; Seizures: No; Stroke: Yes - Siblings; Thyroid Problems: No; Tuberculosis: No; Never smoker; Marital Status - Widowed; Alcohol Use: Never; Drug Use: No History; Caffeine Use: Daily; Financial Concerns: No; Food, Clothing or Shelter Needs: No; Support System Lacking: No; Transportation Concerns: No Electronic Signature(s) Signed: 01/14/2021 11:43:52 AM By: Kalman Shan DO Signed: 01/14/2021 6:03:14 PM By: Lorrin Jackson Entered By: Kalman Shan on 01/14/2021 11:38:06 -------------------------------------------------------------------------------- SuperBill Details Patient Name: Date of Service: Heidi NTA GUE, [name] 01/14/2021 Medical Record Number: [id number] Patient Account Number:  [id number] Date of Birth/Sex: Treating RN: 02/04/1921 (87 y.o. Heidi Dominguez Primary Care Provider: Lajean Manes T Other Clinician: Referring Provider: Treating Provider/Extender: [name] in Treatment: 4 Diagnosis Coding ICD-10 Codes Code Description [phone] Non-pressure chronic ulcer of other part of left foot with  unspecified severity M86.9 Osteomyelitis, unspecified E11.621 Type 2 diabetes mellitus with foot ulcer I10 Essential (primary) hypertension P82.42 Chronic diastolic (congestive) heart failure Facility Procedures CPT4 Code: [id number] Description: 15400 - DEB BONE 20 SQ CM/< ICD-10 Diagnosis Description L97.529 Non-pressure chronic ulcer of other part of left foot with unspecified seve Modifier: rity Quantity: 1 Physician Procedures CPT4: Description Modifier Code [id number] Debridement; bone (includes epidermis, dermis, subQ tissue, muscle and/or fascia, if performed) 1st 20 sqcm or less ICD-10 Diagnosis Description L97.529 Non-pressure chronic ulcer of other part of left foot with  unspecified severity Quantity: 1 Electronic Signature(s) Signed: 01/14/2021 11:54:41 AM By: Kalman Shan DO Signed: 01/14/2021 11:54:41 AM By: Kalman Shan DO Previous Signature: 01/14/2021 11:39:56 AM Version By: Lorrin Jackson Entered By: Kalman Shan on 01/14/2021 11:54:23

## 2021-01-14 NOTE — Progress Notes (Signed)
Heidi Dominguez, Heidi Dominguez ([id number]) Visit Report for 01/14/2021 Arrival Information Details Patient Name: Date of Service: [name], [name] 01/14/2021 10:30 A M Medical Record Number: [id number] Patient Account Number: [id number] Date of Birth/Sex: Treating RN: 10/15/1920 (87 y.o. Heidi Dominguez Primary Care Heidi Dominguez: Heidi Dominguez Other Clinician: Referring Ercole Georg: Treating Amanie Mcculley/Extender: Mervin Hack Weeks in Treatment: 4 Visit Information History Since Last Visit Added or deleted any medications: No Patient Arrived: Wheel Chair Any new allergies or adverse reactions: No Arrival Time: 11:05 Had a fall or experienced change in No Accompanied By: caregiver activities of daily living that may affect Transfer Assistance: None risk of falls: Patient Identification Verified: Yes Signs or symptoms of abuse/neglect since last visito No Secondary Verification Process Completed: Yes Hospitalized since last visit: No Patient Requires Transmission-Based No Implantable device outside of the clinic excluding No Precautions: cellular tissue based products placed in the center Patient Has Alerts: Yes since last visit: Patient Alerts: Patient on Blood Thinner Has Dressing in Place as Prescribed: Yes ABI RandL=Non Pain Present Now: No Compressible TBI R=0.87 L=0.62 Electronic Signature(s) Signed: 01/14/2021 6:25:30 PM By: Zenaida Deed RN, BSN Entered By: Zenaida Deed on 01/14/2021 11:05:56 -------------------------------------------------------------------------------- Encounter Discharge Information Details Patient Name: Date of Service: [name] 01/14/2021 10:30 A M Medical Record Number: [id number] Patient Account Number: [id number] Date of Birth/Sex: Treating RN: 1920-11-30 (87 y.o. Heidi Dominguez Primary Care Khair Chasteen: Heidi Dominguez Other Clinician: Referring Rushie Brazel: Treating Daschel Roughton/Extender: Mervin Hack Weeks in Treatment: 4 Encounter Discharge Information Items Post Procedure Vitals Discharge Condition: Stable Temperature (F): 98.4 Ambulatory Status: Wheelchair Pulse (bpm): 87 Discharge Destination: Home Respiratory Rate (breaths/min): 18 Transportation: Private Auto Blood Pressure (mmHg): 145/85 Accompanied By: caregiver Schedule Follow-up Appointment: Yes Clinical Summary of Care: Patient Declined Electronic Signature(s) Signed: 01/14/2021 6:25:30 PM By: Zenaida Deed RN, BSN Entered By: Zenaida Deed on 01/14/2021 12:12:26 -------------------------------------------------------------------------------- Lower Extremity Assessment Details Patient Name: Date of Service: [name] 01/14/2021 10:30 A M Medical Record Number: [id number] Patient Account Number: [id number] Date of Birth/Sex: Treating RN: [birth year] (87 y.o. Heidi Dominguez Primary Care Heidi Dominguez: Heidi Dominguez Other Clinician: Referring Lauralyn Shadowens: Treating Heidi Dominguez/Extender: Heide Scales Dominguez Weeks in Treatment: 4 Edema Assessment Assessed: [Left: No] [Right: No] Edema: [Left: N] [Right: o] Calf Left: Right: Point of Measurement: 31 cm From Medial Instep 32 cm Ankle Left: Right: Point of Measurement: 10 cm From Medial Instep 22.7 cm Vascular Assessment Pulses: Dorsalis Pedis Palpable: [Left:Yes] Electronic Signature(s) Signed: 01/14/2021 6:25:30 PM By: Zenaida Deed RN, BSN Entered By: Zenaida Deed on 01/14/2021 11:09:46 -------------------------------------------------------------------------------- Multi Wound Chart Details Patient Name: Date of Service: [name] 01/14/2021 10:30 A M Medical Record Number: [id number] Patient Account Number: [id number] Date of Birth/Sex: Treating RN: [birth year] (87 y.o. Heidi Dominguez Primary Care Makaylia Hewett: Heidi Dominguez Other Clinician: Referring Jandel Patriarca: Treating Imri Lor/Extender: Heide Scales Dominguez Weeks in Treatment: 4 Vital Signs Height(in): 64 Capillary Blood Glucose(mg/dl): 425 Weight(lbs): 956 Pulse(bpm): 87 Body Mass Index(BMI): 28 Blood Pressure(mmHg): 145/85 Temperature(F): 98.4 Respiratory Rate(breaths/min): 18 Photos: [1:No Photos Left Dominguez Great oe] [N/A:N/A N/A] Wound Location: [1:Other Lesion] [N/A:N/A] Wounding Event: [1:Diabetic Wound/Ulcer of the Lower] [N/A:N/A] Primary Etiology: [1:Extremity Congestive Heart Failure,] [N/A:N/A] Comorbid History: [1:Hypertension, Peripheral Arterial Disease, Phlebitis, Type II Diabetes, Osteoarthritis 11/24/2019] [N/A:N/A] Date Acquired: [1:4] [N/A:N/A] Weeks of Treatment: [1:Open] [N/A:N/A] Wound Status: [1:0.8x1.3x0.3] [N/A:N/A] Measurements L x W  x D (cm) [1:0.817] [N/A:N/A] A (cm) : rea [1:0.245] [N/A:N/A] Volume (cm) : [1:33.30%] [N/A:N/A] % Reduction in A rea: [1:89.50%] [N/A:N/A] % Reduction in Volume: [1:Grade 2] [N/A:N/A] Classification: [1:Medium] [N/A:N/A] Exudate A mount: [1:Serosanguineous] [N/A:N/A] Exudate Type: [1:red, brown] [N/A:N/A] Exudate Color: [1:Well defined, not attached] [N/A:N/A] Wound Margin: [1:Large (67-100%)] [N/A:N/A] Granulation A mount: [1:Red] [N/A:N/A] Granulation Quality: [1:None Present (0%)] [N/A:N/A] Necrotic A mount: [1:Fat Layer (Subcutaneous Tissue): Yes N/A] Exposed Structures: [1:Bone: Yes Fascia: No Tendon: No Muscle: No Joint: No None] [N/A:N/A] Treatment Notes Electronic Signature(s) Signed: 01/14/2021 11:43:52 AM By: Geralyn Corwin DO Signed: 01/14/2021 6:03:14 PM By: Antonieta Iba Entered By: Geralyn Corwin on 01/14/2021 11:36:26 -------------------------------------------------------------------------------- Multi-Disciplinary Care Plan Details Patient Name: Date of Service: [name] 01/14/2021 10:30 A M Medical Record Number: [id number] Patient Account Number: [id number] Date of Birth/Sex: Treating RN: 1920-11-26 (87 y.o. Heidi Dominguez Primary Care Sharnee Douglass: Heidi Dominguez Other Clinician: Referring Belinda Schlichting: Treating Heidi Dominguez/Extender: Mervin Hack Weeks in Treatment: 4 Active Inactive Orientation to the Wound Care Program Nursing Diagnoses: Knowledge deficit related to the wound healing center program Goals: Patient/caregiver will verbalize understanding of the Wound Healing Center Program Date Initiated: 12/16/2020 Target Resolution Date: 01/21/2021 Goal Status: Active Interventions: Provide education on orientation to the wound center Notes: Wound/Skin Impairment Nursing Diagnoses: Impaired tissue integrity Goals: Patient/caregiver will verbalize understanding of skin care regimen Date Initiated: 12/16/2020 Target Resolution Date: 01/21/2021 Goal Status: Active Ulcer/skin breakdown will have a volume reduction of 30% by week 4 Date Initiated: 12/16/2020 Target Resolution Date: 01/21/2021 Goal Status: Active Interventions: Assess patient/caregiver ability to obtain necessary supplies Assess patient/caregiver ability to perform ulcer/skin care regimen upon admission and as needed Assess ulceration(s) every visit Provide education on ulcer and skin care Treatment Activities: Skin care regimen initiated : 12/16/2020 Topical wound management initiated : 12/16/2020 Notes: Electronic Signature(s) Signed: 01/14/2021 6:03:14 PM By: Antonieta Iba Entered By: Antonieta Iba on 01/14/2021 11:22:07 -------------------------------------------------------------------------------- Pain Assessment Details Patient Name: Date of Service: [name] 01/14/2021 10:30 A M Medical Record Number: [id number] Patient Account Number: [id number] Date of Birth/Sex: Treating RN: 25-Jan-1921 (87 y.o. Heidi Dominguez Primary Care Jaxden Blyden: Heidi Dominguez Other Clinician: Referring My Rinke: Treating Shacarra Choe/Extender: Heide Scales Dominguez Weeks in Treatment: 4 Active  Problems Location of Pain Severity and Description of Pain Patient Has Paino No Site Locations Rate the pain. Current Pain Level: 0 Pain Management and Medication Current Pain Management: Electronic Signature(s) Signed: 01/14/2021 6:25:30 PM By: Zenaida Deed RN, BSN Entered By: Zenaida Deed on 01/14/2021 11:07:10 -------------------------------------------------------------------------------- Patient/Caregiver Education Details Patient Name: Date of Service: [name], [name] 6/3/2022andnbsp10:30 A M Medical Record Number: [id number] Patient Account Number: [id number] Date of Birth/Gender: Treating RN: 07-10-1921 (87 y.o. Heidi Dominguez Primary Care Physician: Heidi Dominguez Other Clinician: Referring Physician: Treating Physician/Extender: Sharren Bridge in Treatment: 4 Education Assessment Education Provided To: Patient Education Topics Provided Infection: Methods: Explain/Verbal, Printed Responses: State content correctly Offloading: Methods: Explain/Verbal, Printed Responses: State content correctly Wound/Skin Impairment: Methods: Explain/Verbal, Printed Responses: State content correctly Electronic Signature(s) Signed: 01/14/2021 6:03:14 PM By: Antonieta Iba Entered By: Antonieta Iba on 01/14/2021 11:22:38 -------------------------------------------------------------------------------- Wound Assessment Details Patient Name: Date of Service: [name] 01/14/2021 10:30 A M Medical Record Number: [id number] Patient Account Number: [id number] Date of Birth/Sex: Treating RN: May 06, 1921 (87 y.o. Heidi Dominguez Primary Care Quasim Doyon: Estrellita Ludwig Other Clinician: Referring  Shawonda Kerce: Treating Elek Holderness/Extender: Joretta Bachelor, Hal Dominguez Weeks in Treatment: 4 Wound Status Wound Number: 1 Primary Diabetic Wound/Ulcer of the Lower Extremity Etiology: Wound Location: Left Dominguez Great oe Wound Open Wounding  Event: Other Lesion Status: Date Acquired: 11/24/2019 Comorbid Congestive Heart Failure, Hypertension, Peripheral Arterial Weeks Of Treatment: 4 History: Disease, Phlebitis, Type II Diabetes, Osteoarthritis Clustered Wound: No Photos Wound Measurements Length: (cm) 0.8 Width: (cm) 1.3 Depth: (cm) 0.3 Area: (cm) 0.817 Volume: (cm) 0.245 % Reduction in Area: 33.3% % Reduction in Volume: 89.5% Epithelialization: None Tunneling: No Undermining: No Wound Description Classification: Grade 2 Wound Margin: Well defined, not attached Exudate Amount: Medium Exudate Type: Serosanguineous Exudate Color: red, brown Foul Odor After Cleansing: No Slough/Fibrino No Wound Bed Granulation Amount: Large (67-100%) Exposed Structure Granulation Quality: Red Fascia Exposed: No Necrotic Amount: None Present (0%) Fat Layer (Subcutaneous Tissue) Exposed: Yes Tendon Exposed: No Muscle Exposed: No Joint Exposed: No Bone Exposed: Yes Treatment Notes Wound #1 (Toe Great) Wound Laterality: Left Cleanser Normal Saline Discharge Instruction: Cleanse the wound with Normal Saline or wound cleanser prior to applying a clean dressing using gauze sponges, not tissue or cotton balls. Soap and Water Discharge Instruction: May shower and wash wound with dial antibacterial soap and water prior to dressing change. Peri-Wound Care Topical Primary Dressing FIBRACOL Plus Dressing, 2x2 in (collagen) Discharge Instruction: Moisten collagen with saline or hydrogel Secondary Dressing Woven Gauze Sponge, Non-Sterile 4x4 in Discharge Instruction: Apply over primary dressing as directed. Felt 2.5 yds x 5.5 in Discharge Instruction: Apply as donut over primary dressing Secured With Conforming Stretch Gauze Bandage, Sterile 2x75 (in/in) Discharge Instruction: Secure with stretch gauze as directed. 19M Medipore H Soft Cloth Surgical Dominguez 4 x 2 (in/yd) ape Discharge Instruction: Secure dressing with tape as  directed. Compression Wrap Compression Stockings Add-Ons Electronic Signature(s) Signed: 01/14/2021 5:48:41 PM By: Karl Ito Signed: 01/14/2021 6:25:30 PM By: Zenaida Deed RN, BSN Entered By: Karl Ito on 01/14/2021 17:44:06 -------------------------------------------------------------------------------- Vitals Details Patient Name: Date of Service: [name], [name] 01/14/2021 10:30 A M Medical Record Number: [id number] Patient Account Number: [id number] Date of Birth/Sex: Treating RN: 31-Mar-1921 (87 y.o. Heidi Dominguez Primary Care Emerald Gehres: Heidi Dominguez Other Clinician: Referring Bobbyjoe Pabst: Treating Kathryne Ramella/Extender: Heide Scales Dominguez Weeks in Treatment: 4 Vital Signs Time Taken: 11:05 Temperature (F): 98.4 Height (in): 64 Pulse (bpm): 87 Source: Stated Respiratory Rate (breaths/min): 18 Weight (lbs): 166 Blood Pressure (mmHg): 145/85 Source: Stated Capillary Blood Glucose (mg/dl): 782 Body Mass Index (BMI): 28.5 Reference Range: 80 - 120 mg / dl Notes GLUCOSE PER PT REPORT this am Electronic Signature(s) Signed: 01/14/2021 6:25:30 PM By: Zenaida Deed RN, BSN Entered By: Zenaida Deed on 01/14/2021 11:07:03

## 2021-01-17 DIAGNOSIS — I110 Hypertensive heart disease with heart failure: Secondary | ICD-10-CM | POA: Diagnosis not present

## 2021-01-17 DIAGNOSIS — E11621 Type 2 diabetes mellitus with foot ulcer: Secondary | ICD-10-CM | POA: Diagnosis not present

## 2021-01-17 DIAGNOSIS — E1140 Type 2 diabetes mellitus with diabetic neuropathy, unspecified: Secondary | ICD-10-CM | POA: Diagnosis not present

## 2021-01-17 DIAGNOSIS — E113293 Type 2 diabetes mellitus with mild nonproliferative diabetic retinopathy without macular edema, bilateral: Secondary | ICD-10-CM | POA: Diagnosis not present

## 2021-01-17 DIAGNOSIS — I5032 Chronic diastolic (congestive) heart failure: Secondary | ICD-10-CM | POA: Diagnosis not present

## 2021-01-17 DIAGNOSIS — L97529 Non-pressure chronic ulcer of other part of left foot with unspecified severity: Secondary | ICD-10-CM | POA: Diagnosis not present

## 2021-01-19 DIAGNOSIS — E113293 Type 2 diabetes mellitus with mild nonproliferative diabetic retinopathy without macular edema, bilateral: Secondary | ICD-10-CM | POA: Diagnosis not present

## 2021-01-19 DIAGNOSIS — L97529 Non-pressure chronic ulcer of other part of left foot with unspecified severity: Secondary | ICD-10-CM | POA: Diagnosis not present

## 2021-01-19 DIAGNOSIS — I5032 Chronic diastolic (congestive) heart failure: Secondary | ICD-10-CM | POA: Diagnosis not present

## 2021-01-19 DIAGNOSIS — I110 Hypertensive heart disease with heart failure: Secondary | ICD-10-CM | POA: Diagnosis not present

## 2021-01-19 DIAGNOSIS — E11621 Type 2 diabetes mellitus with foot ulcer: Secondary | ICD-10-CM | POA: Diagnosis not present

## 2021-01-19 DIAGNOSIS — E1140 Type 2 diabetes mellitus with diabetic neuropathy, unspecified: Secondary | ICD-10-CM | POA: Diagnosis not present

## 2021-01-20 LAB — AEROBIC/ANAEROBIC CULTURE W GRAM STAIN (SURGICAL/DEEP WOUND): Gram Stain: NONE SEEN

## 2021-01-21 DIAGNOSIS — Z7982 Long term (current) use of aspirin: Secondary | ICD-10-CM | POA: Diagnosis not present

## 2021-01-21 DIAGNOSIS — E113293 Type 2 diabetes mellitus with mild nonproliferative diabetic retinopathy without macular edema, bilateral: Secondary | ICD-10-CM | POA: Diagnosis not present

## 2021-01-21 DIAGNOSIS — E11621 Type 2 diabetes mellitus with foot ulcer: Secondary | ICD-10-CM | POA: Diagnosis not present

## 2021-01-21 DIAGNOSIS — I700 Atherosclerosis of aorta: Secondary | ICD-10-CM | POA: Diagnosis not present

## 2021-01-21 DIAGNOSIS — Z9181 History of falling: Secondary | ICD-10-CM | POA: Diagnosis not present

## 2021-01-21 DIAGNOSIS — K219 Gastro-esophageal reflux disease without esophagitis: Secondary | ICD-10-CM | POA: Diagnosis not present

## 2021-01-21 DIAGNOSIS — R269 Unspecified abnormalities of gait and mobility: Secondary | ICD-10-CM | POA: Diagnosis not present

## 2021-01-21 DIAGNOSIS — Z79899 Other long term (current) drug therapy: Secondary | ICD-10-CM | POA: Diagnosis not present

## 2021-01-21 DIAGNOSIS — Z7984 Long term (current) use of oral hypoglycemic drugs: Secondary | ICD-10-CM | POA: Diagnosis not present

## 2021-01-21 DIAGNOSIS — I110 Hypertensive heart disease with heart failure: Secondary | ICD-10-CM | POA: Diagnosis not present

## 2021-01-21 DIAGNOSIS — L97529 Non-pressure chronic ulcer of other part of left foot with unspecified severity: Secondary | ICD-10-CM | POA: Diagnosis not present

## 2021-01-21 DIAGNOSIS — Z741 Need for assistance with personal care: Secondary | ICD-10-CM | POA: Diagnosis not present

## 2021-01-21 DIAGNOSIS — E1140 Type 2 diabetes mellitus with diabetic neuropathy, unspecified: Secondary | ICD-10-CM | POA: Diagnosis not present

## 2021-01-21 DIAGNOSIS — I340 Nonrheumatic mitral (valve) insufficiency: Secondary | ICD-10-CM | POA: Diagnosis not present

## 2021-01-21 DIAGNOSIS — I5032 Chronic diastolic (congestive) heart failure: Secondary | ICD-10-CM | POA: Diagnosis not present

## 2021-01-21 DIAGNOSIS — D696 Thrombocytopenia, unspecified: Secondary | ICD-10-CM | POA: Diagnosis not present

## 2021-01-24 DIAGNOSIS — I110 Hypertensive heart disease with heart failure: Secondary | ICD-10-CM | POA: Diagnosis not present

## 2021-01-24 DIAGNOSIS — E113293 Type 2 diabetes mellitus with mild nonproliferative diabetic retinopathy without macular edema, bilateral: Secondary | ICD-10-CM | POA: Diagnosis not present

## 2021-01-24 DIAGNOSIS — E11621 Type 2 diabetes mellitus with foot ulcer: Secondary | ICD-10-CM | POA: Diagnosis not present

## 2021-01-24 DIAGNOSIS — I5032 Chronic diastolic (congestive) heart failure: Secondary | ICD-10-CM | POA: Diagnosis not present

## 2021-01-24 DIAGNOSIS — E1140 Type 2 diabetes mellitus with diabetic neuropathy, unspecified: Secondary | ICD-10-CM | POA: Diagnosis not present

## 2021-01-24 DIAGNOSIS — L97529 Non-pressure chronic ulcer of other part of left foot with unspecified severity: Secondary | ICD-10-CM | POA: Diagnosis not present

## 2021-01-25 ENCOUNTER — Other Ambulatory Visit: Payer: Self-pay

## 2021-01-25 ENCOUNTER — Ambulatory Visit (HOSPITAL_COMMUNITY)
Admission: RE | Admit: 2021-01-25 | Discharge: 2021-01-25 | Disposition: A | Discharge status: Home / Self Care | Payer: Medicare Other | Source: Ambulatory Visit | Attending: Vascular Surgery | Admitting: Vascular Surgery

## 2021-01-25 ENCOUNTER — Ambulatory Visit (INDEPENDENT_AMBULATORY_CARE_PROVIDER_SITE_OTHER): Payer: Medicare Other | Admitting: Physician Assistant

## 2021-01-25 ENCOUNTER — Telehealth: Payer: Self-pay

## 2021-01-25 ENCOUNTER — Ambulatory Visit (INDEPENDENT_AMBULATORY_CARE_PROVIDER_SITE_OTHER)
Admit: 2021-01-25 | Discharge: 2021-01-25 | Disposition: A | Discharge status: Home / Self Care | Payer: Medicare Other | Attending: Vascular Surgery | Admitting: Vascular Surgery

## 2021-01-25 VITALS — BP 143/91 | HR 89 | Temp 98.1°F | Resp 14 | Ht 64.0 in | Wt 167.0 lb

## 2021-01-25 DIAGNOSIS — I7025 Atherosclerosis of native arteries of other extremities with ulceration: Secondary | ICD-10-CM | POA: Diagnosis not present

## 2021-01-25 NOTE — Telephone Encounter (Signed)
Patient's daughter called after wound care visit and they were informed that cultures came back that indicated current abx regimen is ineffective against her infection. Forwarding to provider to review culture results and for any necessary changes.  Currently on ciprofloxacin 500mg  BID, metronidazole 500mg  BID, and doxycycline 100mg  BID  Cultures indicate resistance to ciprofloxacin.   Heidi Cohen Lorita Officer, RN

## 2021-01-25 NOTE — Telephone Encounter (Signed)
Thanks for the update.  Looks like her cultures grew MRSA that was resistant to ciprofloxacin (not too surprising since she is on cipro for suppression from her orthopedic surgeon), however, it is sensitive to tetracyclines so the doxycycline is adequate coverage.  I would continue the same antibiotic regimen since doxycycline will cover the MRSA part, metronidazole will cover anaerobes that are common with foot infections but difficult to culture, and cipro as previously directed by her surgeon (although I would argue that she can come off this antibiotic in the near future).  Thanks, Mitzi Hansen

## 2021-01-25 NOTE — Progress Notes (Signed)
Office Note     CC:  follow up Requesting Provider:  Lajean Manes, MD  HPI: Heidi Dominguez is a 87 y.o. (January 17, 1921) female who presents for follow up of peripheral artery disease. She recently underwent Aortogram, arteriogram of left lower extremity with left anterior tibial artery stenting and angioplasty by Dr. Stanford Breed on 12/22/20. This was performed due to non healing left great toe ulceration. The wound has been present for approximately 1 year and despite meticulous wound care it wound not heal.  She presents today with her Aide but her daughter was present on speaker phone during visit. Since her intervention she reports that the pain in the left great toe is improved. It is still sore to the touch. She says otherwise her left leg just has been weak. She does ambulate short distances in her home but otherwise she mostly uses a wheel chair. She reports no pain at rest. She is [facility]. She is maintained on Plavix and Aspirin.   The pt not on a statin for cholesterol management.  The pt is on a daily aspirin.   Other AC:  Plavix The pt is on CCB, Diuretic, ARB for hypertension.   The pt is diabetic.   Tobacco hx: never  Past Medical History:  Diagnosis Date   Acute pancreatitis    hx of    Arthritis    "qwhere" (07/04/2018)   Chronic back pain    "all over" (07/04/2018)   Chronic diastolic CHF (congestive heart failure) (HCC)    Chronic kidney disease    stage  3 chronic kidney disease    Complication of anesthesia    "I have a hard time waking up"   Degenerative joint disease of shoulder region    Dry eye syndrome    Dyspnea    Family history of adverse reaction to anesthesia    "daughter has the shakes when she wakes up"   GERD (gastroesophageal reflux disease)    Heart murmur    High cholesterol    History of hiatal hernia    HOH (hard of hearing)    Hypertension    Impacted cerumen of both ears    hx of    Muscle weakness (generalized)     Osteoarthritis    Overactive bladder    Pancreatitis    hx of    Phlebitis    "BLE"   Rotator cuff tear    right    Tear of right supraspinatus tendon    Thrombocytopenia (HCC)    hx of    Type II diabetes mellitus (HCC)    Urinary tract infection    hx of    Xerosis cutis    hx of     Past Surgical History:  Procedure Laterality Date   ABDOMINAL AORTOGRAM W/LOWER EXTREMITY N/A 12/22/2020   Procedure: ABDOMINAL AORTOGRAM W/LOWER EXTREMITY;  Surgeon: Cherre Robins, MD;  Location: [facility];  Service: Cardiovascular;  Laterality: N/A;   ABDOMINAL HYSTERECTOMY     BACK SURGERY     CATARACT EXTRACTION W/ INTRAOCULAR LENS  IMPLANT, BILATERAL Bilateral    CHOLECYSTECTOMY  2016   ERCP N/A 08/30/2018   Procedure: ENDOSCOPIC RETROGRADE CHOLANGIOPANCREATOGRAPHY (ERCP);  Surgeon: Clarene Essex, MD;  Location: [facility];  Service: Endoscopy;  Laterality: N/A;   FIXATION KYPHOPLASTY     INCISION AND DRAINAGE Right 11/08/2018   Procedure: INCISION AND DRAINAGE right shoulder, placement of antibiotic beads ;  Surgeon: [name],  MD;  Location: [facility];  Service: Orthopedics;  Laterality: Right;   JOINT REPLACEMENT     LAPAROSCOPIC CHOLECYSTECTOMY SINGLE SITE WITH INTRAOPERATIVE CHOLANGIOGRAM N/A 04/11/2015   Procedure: LAPAROSCOPIC LYSIS OF ADHESIONS, LAPAROSCOPIC CHOLECYSTECTOMY WITH INTRAOPERATIVE CHOLANGIOGRAM;  Surgeon: Michael Boston, MD;  Location: [facility];  Service: General;  Laterality: N/A;   PANCREATIC STENT PLACEMENT  08/30/2018   Procedure: PANCREATIC STENT PLACEMENT;  Surgeon: Clarene Essex, MD;  Location: [facility];  Service: Endoscopy;;   PERIPHERAL VASCULAR INTERVENTION  12/22/2020   Procedure: PERIPHERAL VASCULAR INTERVENTION;  Surgeon: Cherre Robins, MD;  Location: [facility];  Service: Cardiovascular;;  left anterior tibial artery   REMOVAL OF STONES  08/30/2018   Procedure: REMOVAL OF STONES;  Surgeon: Clarene Essex, MD;  Location: [facility];   Service: Endoscopy;;   SHOULDER OPEN ROTATOR CUFF REPAIR Bilateral    SPHINCTEROTOMY  08/30/2018   Procedure: SPHINCTEROTOMY;  Surgeon: Clarene Essex, MD;  Location: [facility];  Service: Endoscopy;;   TOTAL KNEE ARTHROPLASTY Bilateral     Social History   Socioeconomic History   Marital status: Widowed    Spouse name: Not on file   Number of children: Not on file   Years of education: Not on file   Highest education level: Not on file  Occupational History   Occupation: retired  Tobacco Use   Smoking status: Never   Smokeless tobacco: Never  Vaping Use   Vaping Use: Never used  Substance and Sexual Activity   Alcohol use: No    Comment: rare   Drug use: No   Sexual activity: Not on file  Other Topics Concern   Not on file  Social History Narrative   Not on file   Social Determinants of Health   Financial Resource Strain: Not on file  Food Insecurity: Not on file  Transportation Needs: Not on file  Physical Activity: Not on file  Stress: Not on file  Social Connections: Not on file  Intimate Partner Violence: Not on file   Family History  Problem Relation Age of Onset   Hypertension Mother    Diabetes Mellitus I Mother    Hypertension Father     Current Outpatient Medications  Medication Sig Dispense Refill   acetaminophen (TYLENOL) 650 MG CR tablet Take 1,300 mg by mouth every 8 (eight) hours as needed for pain.     albuterol (VENTOLIN HFA) 108 (90 Base) MCG/ACT inhaler Inhale 2 puffs into the lungs every 6 (six) hours as needed for wheezing or shortness of breath.     amLODipine (NORVASC) 10 MG tablet Take 10 mg by mouth daily.     aspirin EC 81 MG tablet Take 81 mg by mouth daily.     Carboxymethylcellulose Sod PF (REFRESH PLUS) 0.5 % SOLN Place 1 drop into both eyes daily as needed (eye injections). (Patient not taking: Reported on 01/12/2021)     Cholecalciferol (VITAMIN D-3) 125 MCG (5000 UT) TABS Take 5,000 Units by mouth every Monday.     ciprofloxacin  (CIPRO) 500 MG tablet Take 500 mg by mouth every 12 (twelve) hours.     clopidogrel (PLAVIX) 75 MG tablet Take 1 tablet (75 mg total) by mouth daily. 90 tablet 3   colchicine 0.6 MG tablet Take 1 tablet (0.6 mg total) by mouth daily. (Patient not taking: No sig reported) 14 tablet 0   doxycycline (VIBRA-TABS) 100 MG tablet Take 1 tablet (100 mg total) by mouth 2 (two) times daily. 84 tablet 0  famotidine (PEPCID) 20 MG tablet Take one after  supper (Patient not taking: No sig reported) 30 tablet 2   fentaNYL (DURAGESIC) 25 MCG/HR Place 1 patch onto the skin every 3 (three) days.     ferrous sulfate 325 (65 FE) MG tablet Take 325 mg by mouth at bedtime.     furosemide (LASIX) 20 MG tablet Take 20 mg by mouth daily.     JANUVIA 50 MG tablet Take 50 mg by mouth daily.   5   levalbuterol (XOPENEX) 1.25 MG/3ML nebulizer solution Take 1.25 mg by nebulization every 4 (four) hours as needed for shortness of breath or wheezing.     lidocaine (XYLOCAINE) 5 % ointment APPLY TOPICALLY AS NEEDED. TOES FOR PAIN (Patient not taking: No sig reported) 35.44 g 0   loratadine (CLARITIN) 10 MG tablet 1 tablet     losartan (COZAAR) 25 MG tablet Take 25 mg by mouth daily. (Patient not taking: Reported on 01/12/2021)     losartan (COZAAR) 50 MG tablet TAKE 1 TABLET(50 MG) BY MOUTH DAILY 30 tablet 0   Magnesium 250 MG TABS      metroNIDAZOLE (FLAGYL) 500 MG tablet Take 1 tablet (500 mg total) by mouth 2 (two) times daily. 84 tablet 0   Multiple Vitamin (MULTIVITAMIN WITH MINERALS) TABS tablet Take 1 tablet by mouth daily.     Multiple Vitamins-Minerals (IMMUNE SUPPORT PO) Take 1 tablet by mouth daily.     Omega 3 1000 MG CAPS 1 capsule     ONETOUCH VERIO test strip USE TO CHECK BLOOD SUGAR ONCE DAILY.     pantoprazole (PROTONIX) 40 MG tablet Take 1 tablet (40 mg total) by mouth daily. Take 30-60 min before first meal of the day (Patient taking differently: Take 40 mg by mouth 2 (two) times daily.) 30 tablet 5    potassium chloride (K-DUR) 10 MEQ tablet Take 10 mEq by mouth daily.     SYSTANE COMPLETE 0.6 % SOLN Place 2 drops into both eyes 2 (two) times daily.     vitamin B-12 (CYANOCOBALAMIN) 1000 MCG tablet Take 1,000 mcg by mouth every Sunday.     Wound Dressings (FIBRACOL EX) Apply 1 patch topically every other day.     Wound Dressings (PROMOGRAN EX) Apply 1 patch topically every other day.     No current facility-administered medications for this visit.    Allergies  Allergen Reactions   Amoxicillin-Pot Clavulanate Diarrhea   Catapres [Clonidine Hcl] Other (See Comments)    Unknown.    Codeine Other (See Comments)    "Makes me out of my head, loopy"   Hydrocodone Other (See Comments)    "makes me go out of my head, loopy"   Lisinopril Cough   Other Other (See Comments)    Any type of narcotics Feels "loopy"    Sulfa Antibiotics Itching and Other (See Comments)   Sulfamethoxazole Diarrhea   Verapamil Diarrhea and Other (See Comments)   Sulfasalazine Itching     REVIEW OF SYSTEMS:  [X]  denotes positive finding, [ ]  denotes negative finding Cardiac  Comments:  Chest pain or chest pressure:    Shortness of breath upon exertion:    Short of breath when lying flat:    Irregular heart rhythm:        Vascular    Pain in calf, thigh, or hip brought on by ambulation:    Pain in feet at night that wakes you up from your sleep:     Blood clot in  your veins:    Leg swelling:         Pulmonary    Oxygen at home:    Productive cough:     Wheezing:         Neurologic    Sudden weakness in arms or legs:     Sudden numbness in arms or legs:     Sudden onset of difficulty speaking or slurred speech:    Temporary loss of vision in one eye:     Problems with dizziness:         Gastrointestinal    Blood in stool:     Vomited blood:         Genitourinary    Burning when urinating:     Blood in urine:        Psychiatric    Major depression:         Hematologic    Bleeding  problems:    Problems with blood clotting too easily:        Skin    Rashes or ulcers:        Constitutional    Fever or chills:      PHYSICAL EXAMINATION:  Vitals:   01/25/21 1154  BP: (!) 143/91  Pulse: 89  Resp: 14  Temp: 98.1 F (36.7 C)  TempSrc: Temporal  SpO2: 99%  Weight: 167 lb (75.8 kg)  Height: 5\' 4"  (1.626 m)    General:  WDWN in NAD; vital signs documented above Gait: Not observed, in wheelchair HENT: WNL, normocephalic Pulmonary: normal non-labored breathing Cardiac: regular HR Vascular Exam/Pulses:  Right Left  Radial 2+ (normal) 2+ (normal)  Femoral 2+ (normal) 2+ (normal)  Popliteal Not palpable Not palpable  DP Not palpable Not palpable  PT Not palpable Not palpable   Extremities: without ischemic changes, without Gangrene , without cellulitis; with open wound of the medial aspect of left 1st toe. Clean dressings reapplied   Musculoskeletal: no muscle wasting or atrophy  Neurologic: A&O X 3;  No focal weakness or paresthesias are detected Psychiatric:  The pt has Normal affect.   Non-Invasive Vascular Imaging:    +-------+-----------+----------------+------------+------------+  ABI/TBIToday's ABIToday's TBI     Previous ABIPrevious TBI  +-------+-----------+----------------+------------+------------+  Right  [place]         0.75                                      +-------+-----------+----------------+------------+------------+  Left   [place]         unable to obtain                          +-------+-----------+----------------+------------+------------+    Independently reviewed arterial duplex of left lower extremity which shows biphasic flow throughout the left lower extremity. Anterior tibial artery appears to be patent although not well visualized  ASSESSMENT/PLAN:: 87 y.o. female here for follow up of peripheral artery disease. She recently underwent Aortogram, arteriogram of left lower extremity with left anterior tibial  artery stenting and angioplasty by Dr. Stanford Breed on 12/22/20. This was performed due to non healing left great toe ulceration. - ABI today is unreliable due to intolerance to the blood pressure cuff and wound dressings. Duplex however shows adequate perfusion of the left lower extremity with patient anterior tibial artery - She is to remain on Aspirin and Plavix x 12 months - Continue Statin indefinitely -  Advised her to follow up sooner if she has new or worsening symptoms - She will continue to follow up with [facility]ound [facility] Center 3x/ week - she will follow up in 3 months with ABI and LLE arterial duplex   Karoline Caldwell, PA-C [facility] [phone]  Clinic MD:  Roxanne Mins

## 2021-01-25 NOTE — Telephone Encounter (Signed)
Spoke with patient's daughter who stated that the wound team told her that the flagyl was also determined to be ineffective. RN informed her that provider reviewed the results and wants her to remain on current regimen. RN also told her that she can provide Wound team with our information if there is concern about regimen.   Ying Blankenhorn Lorita Officer, RN

## 2021-01-26 ENCOUNTER — Encounter (HOSPITAL_BASED_OUTPATIENT_CLINIC_OR_DEPARTMENT_OTHER): Payer: Medicare Other | Admitting: Physician Assistant

## 2021-01-26 ENCOUNTER — Other Ambulatory Visit: Payer: Self-pay

## 2021-01-26 DIAGNOSIS — I7025 Atherosclerosis of native arteries of other extremities with ulceration: Secondary | ICD-10-CM

## 2021-01-28 ENCOUNTER — Encounter (HOSPITAL_BASED_OUTPATIENT_CLINIC_OR_DEPARTMENT_OTHER): Payer: Medicare Other | Admitting: Internal Medicine

## 2021-01-28 DIAGNOSIS — E113293 Type 2 diabetes mellitus with mild nonproliferative diabetic retinopathy without macular edema, bilateral: Secondary | ICD-10-CM | POA: Diagnosis not present

## 2021-01-28 DIAGNOSIS — E1140 Type 2 diabetes mellitus with diabetic neuropathy, unspecified: Secondary | ICD-10-CM | POA: Diagnosis not present

## 2021-01-28 DIAGNOSIS — I110 Hypertensive heart disease with heart failure: Secondary | ICD-10-CM | POA: Diagnosis not present

## 2021-01-28 DIAGNOSIS — L97529 Non-pressure chronic ulcer of other part of left foot with unspecified severity: Secondary | ICD-10-CM | POA: Diagnosis not present

## 2021-01-28 DIAGNOSIS — I5032 Chronic diastolic (congestive) heart failure: Secondary | ICD-10-CM | POA: Diagnosis not present

## 2021-01-28 DIAGNOSIS — E11621 Type 2 diabetes mellitus with foot ulcer: Secondary | ICD-10-CM | POA: Diagnosis not present

## 2021-01-31 DIAGNOSIS — I5032 Chronic diastolic (congestive) heart failure: Secondary | ICD-10-CM | POA: Diagnosis not present

## 2021-01-31 DIAGNOSIS — E113293 Type 2 diabetes mellitus with mild nonproliferative diabetic retinopathy without macular edema, bilateral: Secondary | ICD-10-CM | POA: Diagnosis not present

## 2021-01-31 DIAGNOSIS — E11621 Type 2 diabetes mellitus with foot ulcer: Secondary | ICD-10-CM | POA: Diagnosis not present

## 2021-01-31 DIAGNOSIS — I110 Hypertensive heart disease with heart failure: Secondary | ICD-10-CM | POA: Diagnosis not present

## 2021-01-31 DIAGNOSIS — L97529 Non-pressure chronic ulcer of other part of left foot with unspecified severity: Secondary | ICD-10-CM | POA: Diagnosis not present

## 2021-01-31 DIAGNOSIS — E1140 Type 2 diabetes mellitus with diabetic neuropathy, unspecified: Secondary | ICD-10-CM | POA: Diagnosis not present

## 2021-02-02 ENCOUNTER — Encounter (HOSPITAL_BASED_OUTPATIENT_CLINIC_OR_DEPARTMENT_OTHER): Payer: Medicare Other | Admitting: Internal Medicine

## 2021-02-02 ENCOUNTER — Other Ambulatory Visit: Payer: Self-pay

## 2021-02-02 DIAGNOSIS — N183 Chronic kidney disease, stage 3 unspecified: Secondary | ICD-10-CM | POA: Diagnosis not present

## 2021-02-02 DIAGNOSIS — E11621 Type 2 diabetes mellitus with foot ulcer: Secondary | ICD-10-CM | POA: Diagnosis not present

## 2021-02-02 DIAGNOSIS — I130 Hypertensive heart and chronic kidney disease with heart failure and stage 1 through stage 4 chronic kidney disease, or unspecified chronic kidney disease: Secondary | ICD-10-CM | POA: Diagnosis not present

## 2021-02-02 DIAGNOSIS — L97529 Non-pressure chronic ulcer of other part of left foot with unspecified severity: Secondary | ICD-10-CM | POA: Diagnosis not present

## 2021-02-02 DIAGNOSIS — E1151 Type 2 diabetes mellitus with diabetic peripheral angiopathy without gangrene: Secondary | ICD-10-CM | POA: Diagnosis not present

## 2021-02-02 DIAGNOSIS — I5032 Chronic diastolic (congestive) heart failure: Secondary | ICD-10-CM | POA: Diagnosis not present

## 2021-02-02 DIAGNOSIS — E1122 Type 2 diabetes mellitus with diabetic chronic kidney disease: Secondary | ICD-10-CM | POA: Diagnosis not present

## 2021-02-02 NOTE — Progress Notes (Signed)
ODELL, FASCHING ([id number]) Visit Report for 02/02/2021 Chief Complaint Document Details Patient Name: Date of Service: MO NTA Lyn Records 02/02/2021 1:45 PM Medical Record Number: [id number] Patient Account Number: [id number] Date of Birth/Sex: Treating RN: May 10, 1921 (87 y.o. Heidi Dominguez Primary Care Provider: Patrina Levering Other Clinician: Referring Provider: Treating Provider/Extender: [name] Weeks in Treatment: 6 Information Obtained from: Patient Chief Complaint Left great toe wound Electronic Signature(s) Signed: 02/02/2021 2:39:59 PM By: Kalman Shan DO Entered By: Kalman Shan on 02/02/2021 14:34:50 -------------------------------------------------------------------------------- Debridement Details Patient Name: Date of Service: [name], [name] 02/02/2021 1:45 PM Medical Record Number: [id number] Patient Account Number: [id number] Date of Birth/Sex: Treating RN: 1920-10-21 (87 y.o. Heidi Dominguez Primary Care Provider: Lajean Manes T Other Clinician: Referring Provider: Treating Provider/Extender: [name] Weeks in Treatment: 6 Debridement Performed for Assessment: Wound #1 Left T Great oe Performed By: Physician Kalman Shan, DO Debridement Type: Debridement Severity of Tissue Pre Debridement: Fat layer exposed Level of Consciousness (Pre-procedure): Awake and Alert Pre-procedure Verification/Time Out Yes - 14:18 Taken: Start Time: 14:18 T Area Debrided (L x W): otal 0.6 (cm) x 1.9 (cm) = 1.14 (cm) Tissue and other material debrided: Viable, Non-Viable, Subcutaneous, Skin: Epidermis Level: Skin/Subcutaneous Tissue Debridement Description: Excisional Instrument: Curette Bleeding: Minimum Hemostasis Achieved: Pressure End Time: 14:20 Procedural Pain: 0 Post Procedural Pain: 0 Response to Treatment: Procedure was tolerated well Level of Consciousness (Post- Awake and  Alert procedure): Post Debridement Measurements of Total Wound Length: (cm) 0.6 Width: (cm) 1.9 Depth: (cm) 0.3 Volume: (cm) 0.269 Character of Wound/Ulcer Post Debridement: Stable Severity of Tissue Post Debridement: Fat layer exposed Post Procedure Diagnosis Same as Pre-procedure Electronic Signature(s) Signed: 02/02/2021 2:39:59 PM By: Kalman Shan DO Signed: 02/02/2021 5:46:25 PM By: Levan Hurst RN, BSN Entered By: Levan Hurst on 02/02/2021 14:22:22 -------------------------------------------------------------------------------- HPI Details Patient Name: Date of Service: [name], [name] 02/02/2021 1:45 PM Medical Record Number: [id number] Patient Account Number: [id number] Date of Birth/Sex: Treating RN: Jun 22, 1921 (87 y.o. Heidi Dominguez Primary Care Provider: Patrina Levering Other Clinician: Referring Provider: Treating Provider/Extender: [name] in Treatment: 6 History of Present Illness HPI Description: Admission 5/5 Ms. Klunder is a 87 year old female with a past medical history of type 2 diabetes on oral agents, essential hypertension, chronic diastolic congestive heart failure and CKD 3 that presents to our office for a diabetic foot wound located to the left first great toe. Patient is accompanied by daughter and caregiver who helps provide the history. The wound started 1 year ago and has not shown signs of improvement. In fact they state that for the past month it has gotten worse. She has been following with podiatry for this issue. She does not report having an image done of this foot. She states that recently she was placed on doxycycline because a home health nurse was concerned about an infection. She is using collagen every other day to the wound site. She currently denies any fever/chills, purulent drainage, increased warmth or erythema to the foot. 5/9; patient presents for 1 week follow-up. She states she feels  well. She has been using collagen for dressing changes with no issues. She denies any acute signs of infection. She denies fever/chills, nausea/vomiting or drainage From the wound. She is scheduled for an MRI on 5/18 5/20; patient presents for 1 week follow-up. She has been using collagen dressing changes with no issues. She  had the MRI of her foot done on 5/18. She denies any signs of local or systemic infection. 6/3; patient presents for 2-week follow-up. She saw infectious disease and was started on oral antibiotics. She currently denies signs of infection and overall feels well. She has been using collagen to the wound. She has no issues or complaints today. 6/22; patient presents for 2-week follow-up. She reports using collagen to the wound. She denies signs of infection. She has no issues or complaints today. Electronic Signature(s) Signed: 02/02/2021 2:39:59 PM By: Kalman Shan DO Entered By: Kalman Shan on 02/02/2021 14:35:24 -------------------------------------------------------------------------------- Physical Exam Details Patient Name: Date of Service: [name], [name] 02/02/2021 1:45 PM Medical Record Number: [id number] Patient Account Number: [id number] Date of Birth/Sex: Treating RN: 11-12-1920 (87 y.o. Heidi Dominguez Primary Care Provider: Patrina Levering Other Clinician: Referring Provider: Treating Provider/Extender: [name] in Treatment: 6 Constitutional respirations regular, non-labored and within target range for patient.Marland Kitchen Psychiatric pleasant and cooperative. Notes Left great toe: Open wound with probing to the bone. There is granulation tissue. No acute signs of soft tissue infection including purulent drainage increased warmth or erythema to the foot. Some nonviable tissue as to the edges of the wound bed present. Electronic Signature(s) Signed: 02/02/2021 2:39:59 PM By: Kalman Shan DO Entered By: Kalman Shan on 02/02/2021 14:36:11 -------------------------------------------------------------------------------- Physician Orders Details Patient Name: Date of Service: [name], [name] 02/02/2021 1:45 PM Medical Record Number: [id number] Patient Account Number: [id number] Date of Birth/Sex: Treating RN: 02-15-1921 (88 y.o. Heidi Dominguez Primary Care Provider: Lajean Manes T Other Clinician: Referring Provider: Treating Provider/Extender: [name] Weeks in Treatment: 6 Verbal / Phone Orders: No Diagnosis Coding ICD-10 Coding Code Description L97.529 Non-pressure chronic ulcer of other part of left foot with unspecified severity M86.9 Osteomyelitis, unspecified E11.621 Type 2 diabetes mellitus with foot ulcer I10 Essential (primary) hypertension D53.29 Chronic diastolic (congestive) heart failure Follow-up Appointments ppointment in 2 weeks. - with Dr. Heber Pen[place] Return A Bathing/ Shower/ Hygiene May shower and wash wound with soap and water. Edema Control - Lymphedema / SCD / Other Elevate legs to the level of the heart or above for 30 minutes daily and/or when sitting, a frequency of: - throughout the day Moisturize legs daily. Off-Loading Other: - Shoes on only when up walking, do not walk around bare foot Additional Orders / Instructions Follow Nutritious Diet - 100-120g of Protein Non Wound Condition Protect area with: - Right 5th toe with foam padding Home Health No change in wound care orders this week; continue Home Health for wound care. May utilize formulary equivalent dressing for wound treatment orders unless otherwise specified. Other Home Health Orders/Instructions: - [facility] Wound Treatment Wound #1 - T Great oe Wound Laterality: Left Cleanser: Normal Saline (Home Health) Every Other Day/15 Days Discharge Instructions: Cleanse the wound with Normal Saline or wound cleanser prior to applying a clean dressing using gauze sponges,  not tissue or cotton balls. Cleanser: Soap and Water [facility]) Every Other Day/15 Days Discharge Instructions: May shower and wash wound with dial antibacterial soap and water prior to dressing change. Prim Dressing: FIBRACOL Plus Dressing, 2x2 in (collagen) (Home Health) Every Other Day/15 Days ary Discharge Instructions: Moisten collagen with saline or hydrogel Secondary Dressing: Woven Gauze Sponge, Non-Sterile 4x4 in [facility]) Every Other Day/15 Days Discharge Instructions: Apply over primary dressing as directed. Secondary Dressing: Felt 2.5 yds x 5.5 in [facility]) Every Other  Day/15 Days Discharge Instructions: Apply as donut over primary dressing Secured With: Conforming Stretch Gauze Bandage, Sterile 2x75 (in/in) (Home Health) Every Other Day/15 Days Discharge Instructions: Secure with stretch gauze as directed. Secured With: 74M Medipore H Soft Cloth Surgical T 4 x 2 (in/yd) (Home Health) Every Other Day/15 Days ape Discharge Instructions: Secure dressing with tape as directed. Electronic Signature(s) Signed: 02/02/2021 2:39:59 PM By: Kalman Shan DO Entered By: Kalman Shan on 02/02/2021 14:36:28 -------------------------------------------------------------------------------- Problem List Details Patient Name: Date of Service: [name], [name] 02/02/2021 1:45 PM Medical Record Number: [id number] Patient Account Number: [id number] Date of Birth/Sex: Treating RN: Jul 10, 1921 (87 y.o. Heidi Dominguez Primary Care Provider: Patrina Levering Other Clinician: Referring Provider: Treating Provider/Extender: [name] Weeks in Treatment: 6 Active Problems ICD-10 Encounter Code Description Active Date MDM Diagnosis L97.529 Non-pressure chronic ulcer of other part of left foot with unspecified severity 12/16/2020 No Yes M86.9 Osteomyelitis, unspecified 12/31/2020 No Yes E11.621 Type 2 diabetes mellitus with foot ulcer 12/16/2020 No  Yes I10 Essential (primary) hypertension 12/16/2020 No Yes U20.25 Chronic diastolic (congestive) heart failure 12/16/2020 No Yes Inactive Problems Resolved Problems Electronic Signature(s) Signed: 02/02/2021 2:39:59 PM By: Kalman Shan DO Entered By: Kalman Shan on 02/02/2021 14:34:30 -------------------------------------------------------------------------------- Progress Note Details Patient Name: Date of Service: [name], [name] 02/02/2021 1:45 PM Medical Record Number: [id number] Patient Account Number: [id number] Date of Birth/Sex: Treating RN: 12-08-1920 (87 y.o. Heidi Dominguez Primary Care Provider: Patrina Levering Other Clinician: Referring Provider: Treating Provider/Extender: [name] Weeks in Treatment: 6 Subjective Chief Complaint Information obtained from Patient Left great toe wound History of Present Illness (HPI) Admission 5/5 Ms. Gatchel is a 87 year old female with a past medical history of type 2 diabetes on oral agents, essential hypertension, chronic diastolic congestive heart failure and CKD 3 that presents to our office for a diabetic foot wound located to the left first great toe. Patient is accompanied by daughter and caregiver who helps provide the history. The wound started 1 year ago and has not shown signs of improvement. In fact they state that for the past month it has gotten worse. She has been following with podiatry for this issue. She does not report having an image done of this foot. She states that recently she was placed on doxycycline because a home health nurse was concerned about an infection. She is using collagen every other day to the wound site. She currently denies any fever/chills, purulent drainage, increased warmth or erythema to the foot. 5/9; patient presents for 1 week follow-up. She states she feels well. She has been using collagen for dressing changes with no issues. She denies any acute signs  of infection. She denies fever/chills, nausea/vomiting or drainage From the wound. She is scheduled for an MRI on 5/18 5/20; patient presents for 1 week follow-up. She has been using collagen dressing changes with no issues. She had the MRI of her foot done on 5/18. She denies any signs of local or systemic infection. 6/3; patient presents for 2-week follow-up. She saw infectious disease and was started on oral antibiotics. She currently denies signs of infection and overall feels well. She has been using collagen to the wound. She has no issues or complaints today. 6/22; patient presents for 2-week follow-up. She reports using collagen to the wound. She denies signs of infection. She has no issues or complaints today. Patient History Information obtained from Patient. Family History Cancer -  Siblings, Diabetes - Mother,Maternal Grandparents, Heart Disease - Mother, Hypertension - Mother, Stroke - Siblings, No family history of Hereditary Spherocytosis, Kidney Disease, Lung Disease, Seizures, Thyroid Problems, Tuberculosis. Social History Never smoker, Marital Status - Widowed, Alcohol Use - Never, Drug Use - No History, Caffeine Use - Daily. Medical History Cardiovascular Patient has history of Congestive Heart Failure, Hypertension, Peripheral Arterial Disease, Phlebitis Endocrine Patient has history of Type II Diabetes Musculoskeletal Patient has history of Osteoarthritis Medical A Surgical History Notes nd Eyes Degenerative Eye Disease Respiratory Uses inhaler Gastrointestinal Hiatal Hernia Genitourinary CKD stage 3-Incontinence Objective Constitutional respirations regular, non-labored and within target range for patient.. Vitals Time Taken: 3:50 PM, Height: 64 in, Weight: 166 lbs, BMI: 28.5, Temperature: 98.9 F, Pulse: 85 bpm, Respiratory Rate: 18 breaths/min, Blood Pressure: 156/80 mmHg, Pulse Oximetry: 92 %. Psychiatric pleasant and cooperative. General Notes: Left  great toe: Open wound with probing to the bone. There is granulation tissue. No acute signs of soft tissue infection including purulent drainage increased warmth or erythema to the foot. Some nonviable tissue as to the edges of the wound bed present. Integumentary (Hair, Skin) Wound #1 status is Open. Original cause of wound was Other Lesion. The date acquired was: 11/24/2019. The wound has been in treatment 6 weeks. The wound is located on the Left T Great. The wound measures 0.6cm length x 1.9cm width x 0.3cm depth; 0.895cm^2 area and 0.269cm^3 volume. There is bone and oe Fat Layer (Subcutaneous Tissue) exposed. There is no tunneling or undermining noted. There is a medium amount of serosanguineous drainage noted. The wound margin is well defined and not attached to the wound base. There is medium (34-66%) red granulation within the wound bed. There is a medium (34-66%) amount of necrotic tissue within the wound bed including Adherent Slough. Assessment Active Problems ICD-10 Non-pressure chronic ulcer of other part of left foot with unspecified severity Osteomyelitis, unspecified Type 2 diabetes mellitus with foot ulcer Essential (primary) hypertension Chronic diastolic (congestive) heart failure Patient's wound is overall stable. There is some nonviable tissue around the edges that I debrided. No signs of infection. We will continue with collagen. Patient had a bone biopsy done at last clinic visit that showed MRSA sensitive to doxycycline. Results of the bone biopsy were discussed with patient and son. Patient is on appropriate antibiotics started by infectious disease, Dr. Juleen China. We will see patient in 2 weeks Procedures Wound #1 Pre-procedure diagnosis of Wound #1 is a Diabetic Wound/Ulcer of the Lower Extremity located on the Left T Great .Severity of Tissue Pre Debridement is: oe Fat layer exposed. There was a Excisional Skin/Subcutaneous Tissue Debridement with a total area of  1.14 sq cm performed by Kalman Shan, DO. With the following instrument(s): Curette to remove Viable and Non-Viable tissue/material. Material removed includes Subcutaneous Tissue and Skin: Epidermis and. No specimens were taken. A time out was conducted at 14:18, prior to the start of the procedure. A Minimum amount of bleeding was controlled with Pressure. The procedure was tolerated well with a pain level of 0 throughout and a pain level of 0 following the procedure. Post Debridement Measurements: 0.6cm length x 1.9cm width x 0.3cm depth; 0.269cm^3 volume. Character of Wound/Ulcer Post Debridement is stable. Severity of Tissue Post Debridement is: Fat layer exposed. Post procedure Diagnosis Wound #1: Same as Pre-Procedure Plan Follow-up Appointments: Return Appointment in 2 weeks. - with Dr. Heber Dover Bathing/ Shower/ Hygiene: May shower and wash wound with soap and water. Edema Control - Lymphedema / SCD /  Other: Elevate legs to the level of the heart or above for 30 minutes daily and/or when sitting, a frequency of: - throughout the day Moisturize legs daily. Off-Loading: Other: - Shoes on only when up walking, do not walk around bare foot Additional Orders / Instructions: Follow Nutritious Diet - 100-120g of Protein Non Wound Condition: Protect area with: - Right 5th toe with foam padding Home Health: No change in wound care orders this week; continue Home Health for wound care. May utilize formulary equivalent dressing for wound treatment orders unless otherwise specified. Other Home Health Orders/Instructions: - [facility] WOUND #1: - T Great Wound Laterality: Left oe Cleanser: Normal Saline (Home Health) Every Other Day/15 Days Discharge Instructions: Cleanse the wound with Normal Saline or wound cleanser prior to applying a clean dressing using gauze sponges, not tissue or cotton balls. Cleanser: Soap and Water [facility]) Every Other Day/15 Days Discharge Instructions: May  shower and wash wound with dial antibacterial soap and water prior to dressing change. Prim Dressing: FIBRACOL Plus Dressing, 2x2 in (collagen) (Home Health) Every Other Day/15 Days ary Discharge Instructions: Moisten collagen with saline or hydrogel Secondary Dressing: Woven Gauze Sponge, Non-Sterile 4x4 in [facility]) Every Other Day/15 Days Discharge Instructions: Apply over primary dressing as directed. Secondary Dressing: Felt 2.5 yds x 5.5 in [facility]) Every Other Day/15 Days Discharge Instructions: Apply as donut over primary dressing Secured With: Conforming Stretch Gauze Bandage, Sterile 2x75 (in/in) (Home Health) Every Other Day/15 Days Discharge Instructions: Secure with stretch gauze as directed. Secured With: 28M Medipore H Soft Cloth Surgical T 4 x 2 (in/yd) (Home Health) Every Other Day/15 Days ape Discharge Instructions: Secure dressing with tape as directed. 1. In office sharp debridement 2. Collagen 3. Follow-up in 2 weeks Electronic Signature(s) Signed: 02/02/2021 2:39:59 PM By: Kalman Shan DO Entered By: Kalman Shan on 02/02/2021 14:39:20 -------------------------------------------------------------------------------- HxROS Details Patient Name: Date of Service: [name], [name] 02/02/2021 1:45 PM Medical Record Number: [id number] Patient Account Number: [id number] Date of Birth/Sex: Treating RN: November 03, 1920 (87 y.o. Heidi Dominguez Primary Care Provider: Patrina Levering Other Clinician: Referring Provider: Treating Provider/Extender: [name] Weeks in Treatment: 6 Information Obtained From Patient Eyes Medical History: Past Medical History Notes: Degenerative Eye Disease Respiratory Medical History: Past Medical History Notes: Uses inhaler Cardiovascular Medical History: Positive for: Congestive Heart Failure; Hypertension; Peripheral Arterial Disease; Phlebitis Gastrointestinal Medical History: Past  Medical History Notes: Hiatal Hernia Endocrine Medical History: Positive for: Type II Diabetes Time with diabetes: 30+ years Treated with: Oral agents Blood sugar tested every day: Yes Tested : QOD Genitourinary Medical History: Past Medical History Notes: CKD stage 3-Incontinence Musculoskeletal Medical History: Positive for: Osteoarthritis Immunizations Pneumococcal Vaccine: Received Pneumococcal Vaccination: Yes Implantable Devices None Family and Social History Cancer: Yes - Siblings; Diabetes: Yes - Mother,Maternal Grandparents; Heart Disease: Yes - Mother; Hereditary Spherocytosis: No; Hypertension: Yes - Mother; Kidney Disease: No; Lung Disease: No; Seizures: No; Stroke: Yes - Siblings; Thyroid Problems: No; Tuberculosis: No; Never smoker; Marital Status - Widowed; Alcohol Use: Never; Drug Use: No History; Caffeine Use: Daily; Financial Concerns: No; Food, Clothing or Shelter Needs: No; Support System Lacking: No; Transportation Concerns: No Electronic Signature(s) Signed: 02/02/2021 2:39:59 PM By: Kalman Shan DO Signed: 02/02/2021 5:46:25 PM By: Levan Hurst RN, BSN Entered By: Kalman Shan on 02/02/2021 14:35:30 -------------------------------------------------------------------------------- [place] Details Patient Name: Date of Service: [name], [name] 02/02/2021 Medical Record Number: [id number] Patient Account Number: [id number] Date of Birth/Sex:  Treating RN: 11-03-1920 (87 y.o. Heidi Dominguez Primary Care Provider: Lajean Manes T Other Clinician: Referring Provider: Treating Provider/Extender: [name] in Treatment: 6 Diagnosis Coding ICD-10 Codes Code Description [phone] Non-pressure chronic ulcer of other part of left foot with unspecified severity M86.9 Osteomyelitis, unspecified E11.621 Type 2 diabetes mellitus with foot ulcer I10 Essential (primary) hypertension U76.54 Chronic diastolic  (congestive) heart failure Facility Procedures CPT4 Code: [id number] Description: 68127 - DEB SUBQ TISSUE 20 SQ CM/< ICD-10 Diagnosis Description L97.529 Non-pressure chronic ulcer of other part of left foot with unspecified severit Modifier: y Quantity: 1 Physician Procedures : CPT4 Code Description Modifier [id number] - WC PHYS SUBQ TISS 20 SQ CM ICD-10 Diagnosis Description L97.529 Non-pressure chronic ulcer of other part of left foot with unspecified severity Quantity: 1 Electronic Signature(s) Signed: 02/02/2021 2:39:59 PM By: Kalman Shan DO Entered By: Kalman Shan on 02/02/2021 14:39:37

## 2021-02-03 ENCOUNTER — Encounter (HOSPITAL_BASED_OUTPATIENT_CLINIC_OR_DEPARTMENT_OTHER): Payer: Medicare Other | Admitting: Internal Medicine

## 2021-02-03 NOTE — Progress Notes (Signed)
NTA GUE, [name] 02/02/2021 1:45 PM Medical Record Number: [id number] Patient Account Number:  [id number] Date of Birth/Sex: Treating RN: 1920-10-29 (87 y.o. Nita Sells Primary Care Gunnard Dorrance: Lajean Manes T Other Clinician: Referring Graysen Woodyard: Treating Maryuri Warnke/Extender: [name] in Treatment: 6 Wound Status Wound Number: 1 Primary Diabetic Wound/Ulcer of the Lower Extremity Etiology: Wound Location: Left T Great oe Wound Open Wounding Event: Other Lesion Status: Date Acquired: 11/24/2019 Comorbid Congestive Heart Failure, Hypertension, Peripheral Arterial Weeks Of Treatment: 6 History: Disease, Phlebitis, Type II Diabetes, Osteoarthritis Clustered Wound: No Photos Wound Measurements Length: (cm) 0.6 Width: (cm) 1.9 Depth: (cm) 0.3 Area: (cm) 0.895 Volume: (cm) 0.269 % Reduction in Area: 26.9% % Reduction in Volume: 88.4% Epithelialization: None Tunneling: No Undermining: No Wound Description Classification: Grade 2 Wound Margin: Well defined, not attached Exudate Amount: Medium Exudate Type: Serosanguineous Exudate Color: red, brown Foul Odor After Cleansing: No Slough/Fibrino No Wound Bed Granulation Amount: Medium (34-66%) Exposed Structure Granulation Quality: Red Fascia Exposed: No Necrotic Amount: Medium (34-66%) Fat Layer (Subcutaneous Tissue) Exposed: Yes Necrotic Quality: Adherent Slough Tendon Exposed: No Muscle Exposed: No Joint Exposed: No Bone Exposed: Yes Treatment Notes Wound #1 (Toe Great) Wound Laterality: Left Cleanser Normal Saline Discharge Instruction: Cleanse the wound with Normal Saline or wound cleanser prior to applying a clean dressing using gauze sponges, not tissue or cotton balls. Soap and Water Discharge Instruction: May shower and wash wound with dial antibacterial soap and water prior to dressing change. Peri-Wound Care Topical Primary Dressing FIBRACOL Plus Dressing, 2x2 in (collagen) Discharge Instruction: Moisten collagen with saline or hydrogel Secondary  Dressing Woven Gauze Sponge, Non-Sterile 4x4 in Discharge Instruction: Apply over primary dressing as directed. Felt 2.5 yds x 5.5 in Discharge Instruction: Apply as donut over primary dressing Secured With Conforming Stretch Gauze Bandage, Sterile 2x75 (in/in) Discharge Instruction: Secure with stretch gauze as directed. 64M Medipore H Soft Cloth Surgical T 4 x 2 (in/yd) ape Discharge Instruction: Secure dressing with tape as directed. Compression Wrap Compression Stockings Add-Ons Electronic Signature(s) Signed: 02/02/2021 4:31:55 PM By: Sandre Kitty Signed: 02/03/2021 8:38:46 AM By: Leane Call Previous Signature: 02/02/2021 1:56:14 PM Version By: Maye Hides Entered By: Sandre Kitty on 02/02/2021 16:29:38 -------------------------------------------------------------------------------- Vitals Details Patient Name: Date of Service: [name], [name] 02/02/2021 1:45 PM Medical Record Number: [id number] Patient Account Number: [id number] Date of Birth/Sex: Treating RN: November 02, 1920 (87 y.o. Nita Sells Primary Care Astou Lada: Other Clinician: Lajean Manes T Referring Wilman Tucker: Treating Georgi Navarrete/Extender: [name] in Treatment: 6 Vital Signs Time Taken: 15:50 Temperature (F): 98.9 Height (in): 64 Pulse (bpm): 85 Weight (lbs): 166 Respiratory Rate (breaths/min): 18 Body Mass Index (BMI): 28.5 Blood Pressure (mmHg): 156/80 Reference Range: 80 - 120 mg / dl Airway Pulse Oximetry (%): 92 Electronic Signature(s) Signed: 02/02/2021 1:59:43 PM By: Maye Hides Signed: 02/03/2021 8:38:46 AM By: Leane Call Entered By: Leane Call on 02/02/2021 13:59:43  ERLEAN, MEALOR ([id number]) Visit Report for 02/02/2021 Arrival Information Details Patient Name: Date of Service: [name], [place] RA Chauncey Cruel 02/02/2021 1:45 PM Medical Record Number: [id number] Patient Account Number: [id number] Date of Birth/Sex: Treating RN: 08-01-1921 (87 y.o. Nita Sells Primary Care Graiden Henes: Lajean Manes T Other Clinician: Referring Malayla Granberry: Treating Jazzelle Zhang/Extender: [name] Weeks in Treatment: 6 Visit Information History Since Last Visit All ordered tests and consults were completed: No Patient Arrived: Wheel Chair Added or deleted any medications: No Arrival Time: 13:47 Any new allergies or adverse reactions: No Accompanied By: son and aide Had a fall or experienced change in No Transfer Assistance: EasyPivot Patient Lift activities of daily living that may affect Patient Identification Verified: Yes risk of falls: Secondary Verification Process Completed: Yes Signs or symptoms of abuse/neglect since last visito No Patient Requires Transmission-Based No Hospitalized since last visit: No Precautions: Implantable device outside of the clinic excluding No Patient Has Alerts: Yes cellular tissue based products placed in the center Patient Alerts: Patient on Blood Thinner since last visit: ABI RandL=Non Has Dressing in Place as Prescribed: Yes Compressible Pain Present Now: No TBI R=0.87 L=0.62 Electronic Signature(s) Signed: 02/02/2021 1:48:51 PM By: Maye Hides Signed: 02/03/2021 8:38:46 AM By: Leane Call Entered By: Leane Call on 02/02/2021 13:48:51 -------------------------------------------------------------------------------- Encounter Discharge Information Details Patient Name: Date of Service: [name], [name] 02/02/2021 1:45 PM Medical Record Number: [id number] Patient Account Number: [id number] Date of Birth/Sex: Treating RN: 1921/07/09 (87 y.o. Nita Sells Primary Care Alexys Gassett:  Lajean Manes T Other Clinician: Referring Dymphna Wadley: Treating Slayden Mennenga/Extender: [name] Weeks in Treatment: 6 Encounter Discharge Information Items Post Procedure Vitals Discharge Condition: Stable Temperature (F): 98.9 Ambulatory Status: Wheelchair Pulse (bpm): 85 Discharge Destination: Home Respiratory Rate (breaths/min): 16 Transportation: Private Auto Blood Pressure (mmHg): 156/80 Accompanied By: son and sitter Schedule Follow-up Appointment: No Clinical Summary of Care: Provided Form Type Recipient Paper Patient son Electronic Signature(s) Signed: 02/03/2021 8:38:46 AM By: Leane Call Entered By: Leane Call on 02/02/2021 15:58:15 -------------------------------------------------------------------------------- Lower Extremity Assessment Details Patient Name: Date of Service: [name], [name] 02/02/2021 1:45 PM Medical Record Number: [id number] Patient Account Number: [id number] Date of Birth/Sex: Treating RN: 02/18/1921 (87 y.o. Nita Sells Primary Care Shellyann Wandrey: Lajean Manes T Other Clinician: Referring Leandrew Keech: Treating Kahleel Fadeley/Extender: [name] in Treatment: 6 Edema Assessment Assessed: [Left: No] [Right: No] Edema: [Left: N] [Right: o] Calf Left: Right: Point of Measurement: 31 cm From Medial Instep 31 cm Ankle Left: Right: Point of Measurement: 10 cm From Medial Instep 22.5 cm Vascular Assessment Pulses: Dorsalis Pedis Palpable: [Left:Yes] [Right:Yes] Electronic Signature(s) Signed: 02/02/2021 2:01:57 PM By: Maye Hides Signed: 02/03/2021 8:38:46 AM By: Leane Call Entered By: Leane Call on 02/02/2021 14:01:57 -------------------------------------------------------------------------------- Multi Wound Chart Details Patient Name: Date of Service: [name], [name] 02/02/2021 1:45 PM Medical Record Number: [id number] Patient Account Number:  [id number] Date of Birth/Sex: Treating RN: 10/28/1920 (87 y.o. Nancy Fetter Primary Care Alisi Lupien: Patrina Levering Other Clinician: Referring Sohum Delillo: Treating Graysen Depaula/Extender: [name] in Treatment: 6 Vital Signs Height(in): 64 Pulse(bpm): 85 Weight(lbs): 166 Blood Pressure(mmHg): 156/80 Body Mass Index(BMI): 28 Temperature(F): 98.9 Respiratory Rate(breaths/min): 18 Photos: [1:No Photos Left T Great oe] [N/A:N/A N/A] Wound Location: [1:Other Lesion] [N/A:N/A] Wounding Event: [1:Diabetic Wound/Ulcer of the Lower] [N/A:N/A] Primary Etiology: [1:Extremity Congestive Heart Failure,] [N/A:N/A] Comorbid History: [1:Hypertension, Peripheral Arterial Disease, Phlebitis, Type II Diabetes,  NTA GUE, [name] 02/02/2021 1:45 PM Medical Record Number: [id number] Patient Account Number:  [id number] Date of Birth/Sex: Treating RN: 1920-10-29 (87 y.o. Nita Sells Primary Care Gunnard Dorrance: Lajean Manes T Other Clinician: Referring Graysen Woodyard: Treating Maryuri Warnke/Extender: [name] in Treatment: 6 Wound Status Wound Number: 1 Primary Diabetic Wound/Ulcer of the Lower Extremity Etiology: Wound Location: Left T Great oe Wound Open Wounding Event: Other Lesion Status: Date Acquired: 11/24/2019 Comorbid Congestive Heart Failure, Hypertension, Peripheral Arterial Weeks Of Treatment: 6 History: Disease, Phlebitis, Type II Diabetes, Osteoarthritis Clustered Wound: No Photos Wound Measurements Length: (cm) 0.6 Width: (cm) 1.9 Depth: (cm) 0.3 Area: (cm) 0.895 Volume: (cm) 0.269 % Reduction in Area: 26.9% % Reduction in Volume: 88.4% Epithelialization: None Tunneling: No Undermining: No Wound Description Classification: Grade 2 Wound Margin: Well defined, not attached Exudate Amount: Medium Exudate Type: Serosanguineous Exudate Color: red, brown Foul Odor After Cleansing: No Slough/Fibrino No Wound Bed Granulation Amount: Medium (34-66%) Exposed Structure Granulation Quality: Red Fascia Exposed: No Necrotic Amount: Medium (34-66%) Fat Layer (Subcutaneous Tissue) Exposed: Yes Necrotic Quality: Adherent Slough Tendon Exposed: No Muscle Exposed: No Joint Exposed: No Bone Exposed: Yes Treatment Notes Wound #1 (Toe Great) Wound Laterality: Left Cleanser Normal Saline Discharge Instruction: Cleanse the wound with Normal Saline or wound cleanser prior to applying a clean dressing using gauze sponges, not tissue or cotton balls. Soap and Water Discharge Instruction: May shower and wash wound with dial antibacterial soap and water prior to dressing change. Peri-Wound Care Topical Primary Dressing FIBRACOL Plus Dressing, 2x2 in (collagen) Discharge Instruction: Moisten collagen with saline or hydrogel Secondary  Dressing Woven Gauze Sponge, Non-Sterile 4x4 in Discharge Instruction: Apply over primary dressing as directed. Felt 2.5 yds x 5.5 in Discharge Instruction: Apply as donut over primary dressing Secured With Conforming Stretch Gauze Bandage, Sterile 2x75 (in/in) Discharge Instruction: Secure with stretch gauze as directed. 64M Medipore H Soft Cloth Surgical T 4 x 2 (in/yd) ape Discharge Instruction: Secure dressing with tape as directed. Compression Wrap Compression Stockings Add-Ons Electronic Signature(s) Signed: 02/02/2021 4:31:55 PM By: Sandre Kitty Signed: 02/03/2021 8:38:46 AM By: Leane Call Previous Signature: 02/02/2021 1:56:14 PM Version By: Maye Hides Entered By: Sandre Kitty on 02/02/2021 16:29:38 -------------------------------------------------------------------------------- Vitals Details Patient Name: Date of Service: [name], [name] 02/02/2021 1:45 PM Medical Record Number: [id number] Patient Account Number: [id number] Date of Birth/Sex: Treating RN: November 02, 1920 (87 y.o. Nita Sells Primary Care Astou Lada: Other Clinician: Lajean Manes T Referring Wilman Tucker: Treating Georgi Navarrete/Extender: [name] in Treatment: 6 Vital Signs Time Taken: 15:50 Temperature (F): 98.9 Height (in): 64 Pulse (bpm): 85 Weight (lbs): 166 Respiratory Rate (breaths/min): 18 Body Mass Index (BMI): 28.5 Blood Pressure (mmHg): 156/80 Reference Range: 80 - 120 mg / dl Airway Pulse Oximetry (%): 92 Electronic Signature(s) Signed: 02/02/2021 1:59:43 PM By: Maye Hides Signed: 02/03/2021 8:38:46 AM By: Leane Call Entered By: Leane Call on 02/02/2021 13:59:43

## 2021-02-04 DIAGNOSIS — L97529 Non-pressure chronic ulcer of other part of left foot with unspecified severity: Secondary | ICD-10-CM | POA: Diagnosis not present

## 2021-02-04 DIAGNOSIS — I5032 Chronic diastolic (congestive) heart failure: Secondary | ICD-10-CM | POA: Diagnosis not present

## 2021-02-04 DIAGNOSIS — I110 Hypertensive heart disease with heart failure: Secondary | ICD-10-CM | POA: Diagnosis not present

## 2021-02-04 DIAGNOSIS — E11621 Type 2 diabetes mellitus with foot ulcer: Secondary | ICD-10-CM | POA: Diagnosis not present

## 2021-02-04 DIAGNOSIS — E1140 Type 2 diabetes mellitus with diabetic neuropathy, unspecified: Secondary | ICD-10-CM | POA: Diagnosis not present

## 2021-02-04 DIAGNOSIS — E113293 Type 2 diabetes mellitus with mild nonproliferative diabetic retinopathy without macular edema, bilateral: Secondary | ICD-10-CM | POA: Diagnosis not present

## 2021-02-07 DIAGNOSIS — I110 Hypertensive heart disease with heart failure: Secondary | ICD-10-CM | POA: Diagnosis not present

## 2021-02-07 DIAGNOSIS — E1140 Type 2 diabetes mellitus with diabetic neuropathy, unspecified: Secondary | ICD-10-CM | POA: Diagnosis not present

## 2021-02-07 DIAGNOSIS — E11621 Type 2 diabetes mellitus with foot ulcer: Secondary | ICD-10-CM | POA: Diagnosis not present

## 2021-02-07 DIAGNOSIS — I5032 Chronic diastolic (congestive) heart failure: Secondary | ICD-10-CM | POA: Diagnosis not present

## 2021-02-07 DIAGNOSIS — L97529 Non-pressure chronic ulcer of other part of left foot with unspecified severity: Secondary | ICD-10-CM | POA: Diagnosis not present

## 2021-02-07 DIAGNOSIS — E113293 Type 2 diabetes mellitus with mild nonproliferative diabetic retinopathy without macular edema, bilateral: Secondary | ICD-10-CM | POA: Diagnosis not present

## 2021-02-09 ENCOUNTER — Other Ambulatory Visit: Payer: Self-pay

## 2021-02-09 ENCOUNTER — Encounter: Payer: Self-pay | Admitting: Internal Medicine

## 2021-02-09 ENCOUNTER — Telehealth: Payer: Self-pay

## 2021-02-09 ENCOUNTER — Ambulatory Visit (INDEPENDENT_AMBULATORY_CARE_PROVIDER_SITE_OTHER): Payer: Medicare Other | Admitting: Internal Medicine

## 2021-02-09 VITALS — BP 146/78 | HR 94 | Temp 98.6°F

## 2021-02-09 DIAGNOSIS — E113293 Type 2 diabetes mellitus with mild nonproliferative diabetic retinopathy without macular edema, bilateral: Secondary | ICD-10-CM | POA: Diagnosis not present

## 2021-02-09 DIAGNOSIS — E11621 Type 2 diabetes mellitus with foot ulcer: Secondary | ICD-10-CM | POA: Diagnosis not present

## 2021-02-09 DIAGNOSIS — L97529 Non-pressure chronic ulcer of other part of left foot with unspecified severity: Secondary | ICD-10-CM | POA: Diagnosis not present

## 2021-02-09 DIAGNOSIS — I5032 Chronic diastolic (congestive) heart failure: Secondary | ICD-10-CM | POA: Diagnosis not present

## 2021-02-09 DIAGNOSIS — M86172 Other acute osteomyelitis, left ankle and foot: Secondary | ICD-10-CM

## 2021-02-09 DIAGNOSIS — E1140 Type 2 diabetes mellitus with diabetic neuropathy, unspecified: Secondary | ICD-10-CM | POA: Diagnosis not present

## 2021-02-09 DIAGNOSIS — I110 Hypertensive heart disease with heart failure: Secondary | ICD-10-CM | POA: Diagnosis not present

## 2021-02-09 NOTE — Assessment & Plan Note (Signed)
Long discussion had today with patient, her daughter, and cousin (who is an Doctor, general practice).  We discussed that this type of long standing ulcer and chronic bone infection is very difficult to eradicate and cure without a more definitive procedure to remove the infected tissue but they have declined amputation thus far.  She has been doing well on PO therapy the past few weeks without any worsening of her wound based on their recall and wound care notes.  They have concern after talking with family members in the medical profession that IV therapy will give her a better chance to cure this infection as opposed to oral therapy.  Previously we had held off on doing IV therapy due to the possibility this would require SNF placement and patient prefers to remain at home and not be in a facility.  I discussed that the conventional wisdom has been that these infections require IV therapy but newer data has been supportive of PO and, taking into account the whole picture, this may be a better option for her.  After this discussion, they prefer to do IV antibiotics and feel that they can accommodate doing so in the home with the assistance of home health services.  Further discussed that whether we treat with IV or PO that the chances of cure are challenging without a surgical procedure.  They seem to have good understanding of this.  Will arrange for IR evaluation to place a PICC line vs tunneled [place] and plan for daptomycin 43m/kg daily x 6 weeks, weekly CBC, CMP, ESR, CRP, and CK.  Will check all these labs today as well.  Will continue metronidazole in the setting of diabetic foot infection also.  RTC 3 weeks and she will continue doxycycline until PICC line placed.

## 2021-02-09 NOTE — Telephone Encounter (Signed)
Spoke to [place] with Interventional Radiology at [facility] and patient is scheduled for 02/11/21 arrival time 1:45 pm for picc line placement. Patient's daughter informed of patient appointment. Caryl Pina also informed patient will need first dose at short stay.   Melissa with [facility] notified that we will be faxing over orders for the patient and I have provided her with the patient's appointment information and patient will receive first dose with short stay.

## 2021-02-09 NOTE — Progress Notes (Signed)
[facility]  CHIEF COMPLAINT:    Follow up for osteomyelitis  SUBJECTIVE:    Heidi Dominguez is a 87 y.o. female with PMHx as below who presents to the clinic for osteomyelitis.   She has been taking her doxycycline 134m BID, MTZ 5043mBID as prescribed.  She also has continued to take Cipro that was previously prescribed by her orthopedic surgeon.  She had a bone culture done by her wound care provider on 6/3 prior to starting the Doxy and MTZ that grew MRSA which was sensitive to tetracyclines.  Her pre-treatment ESR is 19 and CRP is 10.9.  She saw [facility] on 6/14 who noted duplex study showed adequate perfusion of the left lower extremity.  She saw her wound care provider last week on 02/02/21.  She reports no worsening of her wound and improvement in pain.  No drainage, fevers, chills.  No n/v/d.  She has been on current doxy/MTZ regimen since 01/14/21.  Please see A&P for the details of today's visit and status of the patient's medical problems.   Patient's Medications  New Prescriptions   No medications on file  Previous Medications   ACETAMINOPHEN (TYLENOL) 650 MG CR TABLET    Take 1,300 mg by mouth every 8 (eight) hours as needed for pain.   ALBUTEROL (VENTOLIN HFA) 108 (90 BASE) MCG/ACT INHALER    Inhale 2 puffs into the lungs every 6 (six) hours as needed for wheezing or shortness of breath.   AMLODIPINE (NORVASC) 10 MG TABLET    Take 10 mg by mouth daily.   ASPIRIN EC 81 MG TABLET    Take 81 mg by mouth daily.   CARBOXYMETHYLCELLULOSE SOD PF (REFRESH PLUS) 0.5 % SOLN    Place 1 drop into both eyes daily as needed (eye injections).   CHOLECALCIFEROL (VITAMIN D-3) 125 MCG (5000 UT) TABS    Take 5,000 Units by mouth every Monday.   CIPROFLOXACIN (CIPRO) 500 MG TABLET    Take 500 mg by mouth every 12 (twelve) hours.   CLOPIDOGREL (PLAVIX) 75 MG TABLET    Take 1 tablet (75 mg total) by mouth daily.   COLCHICINE 0.6 MG TABLET    Take 1 tablet (0.6 mg  total) by mouth daily.   DOXYCYCLINE (VIBRA-TABS) 100 MG TABLET    Take 1 tablet (100 mg total) by mouth 2 (two) times daily.   FAMOTIDINE (PEPCID) 20 MG TABLET    Take one after  supper   FENTANYL (DURAGESIC) 25 MCG/HR    Place 1 patch onto the skin every 3 (three) days.   FERROUS SULFATE 325 (65 FE) MG TABLET    Take 325 mg by mouth at bedtime.   FUROSEMIDE (LASIX) 20 MG TABLET    Take 20 mg by mouth daily.   JANUVIA 50 MG TABLET    Take 50 mg by mouth daily.    LEVALBUTEROL (XOPENEX) 1.25 MG/3ML NEBULIZER SOLUTION    Take 1.25 mg by nebulization every 4 (four) hours as needed for shortness of breath or wheezing.   LIDOCAINE (XYLOCAINE) 5 % OINTMENT    APPLY TOPICALLY AS NEEDED. TOES FOR PAIN   LORATADINE (CLARITIN) 10 MG TABLET    1 tablet   LOSARTAN (COZAAR) 25 MG TABLET    Take 25 mg by mouth daily.   LOSARTAN (COZAAR) 50 MG TABLET    TAKE 1 TABLET(50 MG) BY MOUTH DAILY   MAGNESIUM 250 MG TABS  METRONIDAZOLE (FLAGYL) 500 MG TABLET    Take 1 tablet (500 mg total) by mouth 2 (two) times daily.   MULTIPLE VITAMIN (MULTIVITAMIN WITH MINERALS) TABS TABLET    Take 1 tablet by mouth daily.   MULTIPLE VITAMINS-MINERALS (IMMUNE SUPPORT PO)    Take 1 tablet by mouth daily.   OMEGA 3 1000 MG CAPS    1 capsule   ONDANSETRON (ZOFRAN) 4 MG TABLET    1 tablet as needed   ONETOUCH VERIO TEST STRIP    USE TO CHECK BLOOD SUGAR ONCE DAILY.   PANTOPRAZOLE (PROTONIX) 40 MG TABLET    Take 1 tablet (40 mg total) by mouth daily. Take 30-60 min before first meal of the day   POTASSIUM CHLORIDE (K-DUR) 10 MEQ TABLET    Take 10 mEq by mouth daily.   SYSTANE COMPLETE 0.6 % SOLN    Place 2 drops into both eyes 2 (two) times daily.   VITAMIN B-12 (CYANOCOBALAMIN) 1000 MCG TABLET    Take 1,000 mcg by mouth every Sunday.   WOUND DRESSINGS (FIBRACOL EX)    Apply 1 patch topically every other day.   WOUND DRESSINGS (PROMOGRAN EX)    Apply 1 patch topically every other day.  Modified Medications   No medications on  file  Discontinued Medications   No medications on file      Past Medical History:  Diagnosis Date   Acute pancreatitis    hx of    Arthritis    "qwhere" (07/04/2018)   Chronic back pain    "all over" (07/04/2018)   Chronic diastolic CHF (congestive heart failure) (HCC)    Chronic kidney disease    stage  3 chronic kidney disease    Complication of anesthesia    "I have a hard time waking up"   Degenerative joint disease of shoulder region    Dry eye syndrome    Dyspnea    Family history of adverse reaction to anesthesia    "daughter has the shakes when she wakes up"   GERD (gastroesophageal reflux disease)    Heart murmur    High cholesterol    History of hiatal hernia    HOH (hard of hearing)    Hypertension    Impacted cerumen of both ears    hx of    Muscle weakness (generalized)    Osteoarthritis    Overactive bladder    Pancreatitis    hx of    Phlebitis    "BLE"   Rotator cuff tear    right    Tear of right supraspinatus tendon    Thrombocytopenia (HCC)    hx of    Type II diabetes mellitus (HCC)    Urinary tract infection    hx of    Xerosis cutis    hx of     Social History   Tobacco Use   Smoking status: Never   Smokeless tobacco: Never  Vaping Use   Vaping Use: Never used  Substance Use Topics   Alcohol use: No    Comment: rare   Drug use: No    Family History  Problem Relation Age of Onset   Hypertension Mother    Diabetes Mellitus I Mother    Hypertension Father     Allergies  Allergen Reactions   Amoxicillin-Pot Clavulanate Diarrhea   Catapres [Clonidine Hcl] Other (See Comments)    Unknown.    Codeine Other (See Comments)    "Makes me out of my head,  loopy"   Hydrocodone Other (See Comments)    "makes me go out of my head, loopy"   Lisinopril Cough   Other Other (See Comments)    Any type of narcotics Feels "loopy"    Sulfa Antibiotics Itching and Other (See Comments)   Sulfamethoxazole Diarrhea   Verapamil Diarrhea  and Other (See Comments)   Sulfasalazine Itching    Review of Systems  Constitutional: Negative.   Respiratory: Negative.    Cardiovascular: Negative.   Musculoskeletal:  Positive for joint pain.  Skin: Negative.     OBJECTIVE:    Vitals:   02/09/21 1538  BP: (!) 146/78  Pulse: 94  Temp: 98.6 F (37 C)  TempSrc: Oral   There is no height or weight on file to calculate BMI.  Physical Exam Constitutional:      Comments: Elderly appearing woman, sitting in wheelchair, NAD.  HENT:     Head: Normocephalic and atraumatic.  Pulmonary:     Effort: Pulmonary effort is normal. No respiratory distress.  Skin:    General: Skin is warm and dry.     Findings: No rash.  Neurological:     General: No focal deficit present.     Mental Status: She is oriented to person, place, and time.      Labs and Microbiology: CBC Latest Ref Rng & Units 01/12/2021 12/22/2020 12/22/2020  WBC 3.8 - 10.8 Thousand/uL 10.0 8.9 -  Hemoglobin 11.7 - 15.5 g/dL 15.4 14.2 16.7(H)  Hematocrit 35.0 - 45.0 % 49.3(H) 44.8 49.0(H)  Platelets 140 - 400 Thousand/uL 195 208 -   CMP Latest Ref Rng & Units 01/12/2021 12/22/2020 12/22/2020  Glucose 65 - 99 mg/dL 138(H) - 135(H)  BUN 7 - 25 mg/dL 14 - 16  Creatinine 0.60 - 0.88 mg/dL 1.17(H) 0.74 0.90  Sodium 135 - 146 mmol/L 137 - 140  Potassium 3.5 - 5.3 mmol/L 4.8 - 3.9  Chloride 98 - 110 mmol/L 96(L) - 98  CO2 20 - 32 mmol/L 28 - -  Calcium 8.6 - 10.4 mg/dL 10.6(H) - -  Total Protein 6.1 - 8.1 g/dL 7.6 - -  Total Bilirubin 0.2 - 1.2 mg/dL 0.5 - -  Alkaline Phos 38 - 126 U/L - - -  AST 10 - 35 U/L 21 - -  ALT 6 - 29 U/L 12 - -     No results found for this or any previous visit (from the past 240 hour(s)).  Imaging: IMPRESSION: 1. Acute osteomyelitis of the proximal phalanx and distal phalanx of the left great toe. Great toe IP joint effusion compatible with septic arthritis. 2. Moderate hallux valgus deformity with osteoarthritis of the first MTP  joint. Trace first MTP joint effusion, which may be reactive. Septic arthritis at this location is not excluded. 3. Mild patchy bone marrow edema within the intermediate cuneiform with subtle linear low signal component suspicious for developing fracture.   ASSESSMENT & PLAN:    Osteomyelitis ([place]) Long discussion had today with patient, her daughter, and cousin (who is an Doctor, general practice).  We discussed that this type of long standing ulcer and chronic bone infection is very difficult to eradicate and cure without a more definitive procedure to remove the infected tissue but they have declined amputation thus far.  She has been doing well on PO therapy the past few weeks without any worsening of her wound based on their recall and wound care notes.  They have concern after talking with family members in the medical  profession that IV therapy will give her a better chance to cure this infection as opposed to oral therapy.  Previously we had held off on doing IV therapy due to the possibility this would require SNF placement and patient prefers to remain at home and not be in a facility.  I discussed that the conventional wisdom has been that these infections require IV therapy but newer data has been supportive of PO and, taking into account the whole picture, this may be a better option for her.  After this discussion, they prefer to do IV antibiotics and feel that they can accommodate doing so in the home with the assistance of home health services.  Further discussed that whether we treat with IV or PO that the chances of cure are challenging without a surgical procedure.  They seem to have good understanding of this.  Will arrange for IR evaluation to place a PICC line vs tunneled [place] and plan for daptomycin 79m/kg daily x 6 weeks, weekly CBC, CMP, ESR, CRP, and CK.  Will check all these labs today as well.  Will continue metronidazole in the setting of diabetic foot infection also.  RTC 3 weeks  and she will continue doxycycline until PICC line placed.    Orders Placed This Encounter  Procedures   IR Fluoro Guide CV Line Left    Standing Status:   Future    Standing Expiration Date:   02/09/2022    Order Specific Question:   Reason for Exam (SYMPTOM  OR DIAGNOSIS REQUIRED)    Answer:   Picc placement or tunneled line for IV abx    Order Specific Question:   [facility]    [facility]  Sedimentation rate   C-reactive protein   CBC   Comp Met (CMET)   CK (Creatine Kinase)      [facility] 02/09/2021, 5:11 PM  I spent 40 minutes dedicated to the care of this patient on the date of this encounter to include pre-visit review of records, face-to-face time with the patient discussing osteomyelitis, and post-visit ordering of testing.

## 2021-02-09 NOTE — Telephone Encounter (Signed)
I called short stay and left a message that patient will need first dose of daptomycin with them and patient is scheduled with IR on 02/11/21 at 2:00 pm. Orders faxed to short stay as well.

## 2021-02-09 NOTE — Patient Instructions (Addendum)
I have placed orders to arrange for home IV antibiotics and Interventional radiology evaluation to place a long term IV for antibiotics.  They will call to arrange scheduling.  Until the IV antibiotics have been arranged, please continue oral doxycycline and metronidazole.  Once the IV antibiotics are set up you will do Daptomycin via IV  and continue taking the oral metronidazole twice daily.  Follow up with me in 3 weeks.

## 2021-02-10 ENCOUNTER — Telehealth: Payer: Self-pay

## 2021-02-10 LAB — CBC
HCT: 48.7 % — ABNORMAL HIGH (ref 35.0–45.0)
Hemoglobin: 16 g/dL — ABNORMAL HIGH (ref 11.7–15.5)
MCH: 29.5 pg (ref 27.0–33.0)
MCHC: 32.9 g/dL (ref 32.0–36.0)
MCV: 89.7 fL (ref 80.0–100.0)
MPV: 12.4 fL (ref 7.5–12.5)
Platelets: 197 10*3/uL (ref 140–400)
RBC: 5.43 10*6/uL — ABNORMAL HIGH (ref 3.80–5.10)
RDW: 13.2 % (ref 11.0–15.0)
WBC: 9.5 10*3/uL (ref 3.8–10.8)

## 2021-02-10 LAB — SEDIMENTATION RATE: Sed Rate: 17 mm/h (ref 0–30)

## 2021-02-10 LAB — COMPREHENSIVE METABOLIC PANEL
AG Ratio: 1.1 (calc) (ref 1.0–2.5)
ALT: 9 U/L (ref 6–29)
AST: 16 U/L (ref 10–35)
Albumin: 3.9 g/dL (ref 3.6–5.1)
Alkaline phosphatase (APISO): 66 U/L (ref 37–153)
BUN: 10 mg/dL (ref 7–25)
CO2: 32 mmol/L (ref 20–32)
Calcium: 10.2 mg/dL (ref 8.6–10.4)
Chloride: 95 mmol/L — ABNORMAL LOW (ref 98–110)
Creat: 0.81 mg/dL (ref 0.60–0.88)
Globulin: 3.5 g/dL (calc) (ref 1.9–3.7)
Glucose, Bld: 153 mg/dL — ABNORMAL HIGH (ref 65–99)
Potassium: 4.8 mmol/L (ref 3.5–5.3)
Sodium: 135 mmol/L (ref 135–146)
Total Bilirubin: 0.5 mg/dL (ref 0.2–1.2)
Total Protein: 7.4 g/dL (ref 6.1–8.1)

## 2021-02-10 LAB — CK: Total CK: 24 U/L — ABNORMAL LOW (ref 29–143)

## 2021-02-10 LAB — C-REACTIVE PROTEIN: CRP: 21.3 mg/L — ABNORMAL HIGH (ref <8.0)

## 2021-02-10 NOTE — Telephone Encounter (Signed)
Office notes faxed to [facility] @ [phone] Her number is : [place], RN

## 2021-02-10 NOTE — Telephone Encounter (Signed)
Patient's daughter would like to place patient in a rehab facility in order for her PICC line to be managed effectively and to receive her iv abx. Left voicemail with [facility] social worker, Nira Conn to inquire what is needed from our office. Patient's daughter has reached out to her PCP already.  Carlean Purl, RN  Whitney: [phone]

## 2021-02-10 NOTE — Telephone Encounter (Signed)
Spoke with Whitney at [facility] who stated they don't have any openings until next week. RN let her know the patient isn't getting her medication or PICC placed until next week. She requested records (office note and H&P) in order to determine if she would be able to be accepted to the facility.   She did state an FL2 will need to be completed prior to her admission, if accepted.   Daylen Lipsky Lorita Officer, RN

## 2021-02-10 NOTE — Telephone Encounter (Signed)
-----   Message from Mignon Pine, DO sent at 02/10/2021  8:51 AM EDT ----- Please let pt know labs are stable from yesterday with no new concerns.

## 2021-02-10 NOTE — Telephone Encounter (Signed)
Attempted to contact patient's daughter to update her on rescheduled PICC appointment; unable to leave voicemail as line kept ringing.   Patient is rescheduled to get her PICC on 7/7 @ 2pm with first dose to follow. Mary with [facility] updated on date/time.   Shamicka Inga Lorita Officer, RN

## 2021-02-10 NOTE — Telephone Encounter (Signed)
Patient's daughter notified of lab results. No further questions/concerns at this time.   Ezelle Surprenant Lorita Officer, RN

## 2021-02-10 NOTE — Telephone Encounter (Signed)
Patient's daughter returned call and notified of new appointment time/date.   No further questions/concerns at this time.   Delaney Perona Lorita Officer, RN

## 2021-02-11 ENCOUNTER — Ambulatory Visit (HOSPITAL_COMMUNITY): Payer: Medicare Other

## 2021-02-11 ENCOUNTER — Encounter (HOSPITAL_COMMUNITY): Payer: Medicare Other

## 2021-02-15 ENCOUNTER — Other Ambulatory Visit: Payer: Self-pay

## 2021-02-15 ENCOUNTER — Encounter (HOSPITAL_BASED_OUTPATIENT_CLINIC_OR_DEPARTMENT_OTHER): Payer: Medicare Other | Attending: Internal Medicine | Admitting: Internal Medicine

## 2021-02-15 DIAGNOSIS — E1169 Type 2 diabetes mellitus with other specified complication: Secondary | ICD-10-CM | POA: Diagnosis not present

## 2021-02-15 DIAGNOSIS — E11621 Type 2 diabetes mellitus with foot ulcer: Secondary | ICD-10-CM

## 2021-02-15 DIAGNOSIS — I10 Essential (primary) hypertension: Secondary | ICD-10-CM

## 2021-02-15 DIAGNOSIS — N183 Chronic kidney disease, stage 3 unspecified: Secondary | ICD-10-CM | POA: Diagnosis not present

## 2021-02-15 DIAGNOSIS — I130 Hypertensive heart and chronic kidney disease with heart failure and stage 1 through stage 4 chronic kidney disease, or unspecified chronic kidney disease: Secondary | ICD-10-CM | POA: Insufficient documentation

## 2021-02-15 DIAGNOSIS — E1122 Type 2 diabetes mellitus with diabetic chronic kidney disease: Secondary | ICD-10-CM | POA: Diagnosis not present

## 2021-02-15 DIAGNOSIS — M869 Osteomyelitis, unspecified: Secondary | ICD-10-CM | POA: Diagnosis not present

## 2021-02-15 DIAGNOSIS — I5032 Chronic diastolic (congestive) heart failure: Secondary | ICD-10-CM | POA: Diagnosis not present

## 2021-02-15 DIAGNOSIS — L97529 Non-pressure chronic ulcer of other part of left foot with unspecified severity: Secondary | ICD-10-CM

## 2021-02-15 DIAGNOSIS — Z7984 Long term (current) use of oral hypoglycemic drugs: Secondary | ICD-10-CM | POA: Diagnosis not present

## 2021-02-15 NOTE — Progress Notes (Signed)
Heidi Dominguez ([id number]) Visit Report for 02/15/2021 Chief Complaint Document Details Patient Name: Date of Service: Heidi NTA GUE, Heidi RA S. 02/15/2021 10:45 A M Medical Record Number: [id number] Patient Account Number: [id number] Date of Birth/Sex: Treating RN: 08-Mar-1921 (87 y.o. Heidi Dominguez Primary Care Provider: Patrina Dominguez Other Clinician: Referring Provider: Treating Provider/Extender: Heidi Dominguez Dominguez in Treatment: 8 Information Obtained from: Patient Chief Complaint Left great toe wound Electronic Signature(s) Signed: 02/15/2021 2:02:28 PM By: Heidi Shan DO Entered By: Heidi Dominguez on 02/15/2021 13:54:47 -------------------------------------------------------------------------------- HPI Details Patient Name: Date of Service: Heidi NTA GUE, [name] 02/15/2021 10:45 A M Medical Record Number: [id number] Patient Account Number: [id number] Date of Birth/Sex: Treating RN: 03-Sep-1920 (87 y.o. Heidi Dominguez Primary Care Provider: Patrina Dominguez Other Clinician: Referring Provider: Treating Provider/Extender: Heidi Dominguez in Treatment: 8 History of Present Illness HPI Description: Admission 5/5 Heidi Dominguez is a 87 year old female with a past medical history of type 2 diabetes on oral agents, essential hypertension, chronic diastolic congestive heart failure and CKD 3 that presents to our office for a diabetic foot wound located to the left first great toe. Patient is accompanied by daughter and caregiver who helps provide the history. The wound started 1 year ago and has not shown signs of improvement. In fact they state that for the past month it has gotten worse. She has been following with podiatry for this issue. She does not report having an image done of this foot. She states that recently she was placed on doxycycline because a home health nurse was concerned about an infection. She is using collagen every  other day to the wound site. She currently denies any fever/chills, purulent drainage, increased warmth or erythema to the foot. 5/9; patient presents for 1 week follow-up. She states she feels well. She has been using collagen for dressing changes with no issues. She denies any acute signs of infection. She denies fever/chills, nausea/vomiting or drainage From the wound. She is scheduled for an MRI on 5/18 5/20; patient presents for 1 week follow-up. She has been using collagen dressing changes with no issues. She had the MRI of her foot done on 5/18. She denies any signs of local or systemic infection. 6/3; patient presents for 2-week follow-up. She saw infectious disease and was started on oral antibiotics. She currently denies signs of infection and overall feels well. She has been using collagen to the wound. She has no issues or complaints today. 6/22; patient presents for 2-week follow-up. She reports using collagen to the wound. She denies signs of infection. She has no issues or complaints today. 7/5; patient presents for 2-week follow-up. She reports using collagen to the wound. She states that she will be started on IV antibiotics by infectious disease. She denies signs of infection and she has no issues or complaints today. Electronic Signature(s) Signed: 02/15/2021 2:02:28 PM By: Heidi Shan DO Entered By: Heidi Dominguez on 02/15/2021 13:55:36 -------------------------------------------------------------------------------- Physical Exam Details Patient Name: Date of Service: Heidi NTA GUE, [name] 02/15/2021 10:45 A M Medical Record Number: [id number] Patient Account Number: [id number] Date of Birth/Sex: Treating RN: 11/10/1920 (87 y.o. Heidi Dominguez Primary Care Provider: Patrina Dominguez Other Clinician: Referring Provider: Treating Provider/Extender: Heidi Dominguez in Treatment: 8 Constitutional respirations regular, non-labored and within target  range for patient.. Cardiovascular 2+ dorsalis pedis/posterior tibialis pulses. Psychiatric pleasant and cooperative. Notes Left great toe: Open wound with  probing to the bone. There is granulation tissue. No acute signs of soft tissue infection including purulent drainage increased warmth or erythema to the foot. Some nonviable tissue as to the edges of the wound bed present. Electronic Signature(s) Signed: 02/15/2021 2:02:28 PM By: Heidi Shan DO Entered By: Heidi Dominguez on 02/15/2021 13:56:15 -------------------------------------------------------------------------------- Physician Orders Details Patient Name: Date of Service: Heidi NTA GUE, [name] 02/15/2021 10:45 A M Medical Record Number: [id number] Patient Account Number: [id number] Date of Birth/Sex: Treating RN: [birth year] (87 y.o. Heidi Dominguez, Meta.Reding Primary Care Provider: Lajean Manes Dominguez Other Clinician: Referring Provider: Treating Provider/Extender: Heidi Dominguez Dominguez in Treatment: 8 Verbal / Phone Orders: No Diagnosis Coding ICD-10 Coding Code Description L97.529 Non-pressure chronic ulcer of other part of left foot with unspecified severity M86.9 Osteomyelitis, unspecified E11.621 Type 2 diabetes mellitus with foot ulcer I10 Essential (primary) hypertension S85.46 Chronic diastolic (congestive) heart failure Follow-up Appointments ppointment in 2 Dominguez. - with Dr. Heber [place] Return A Bathing/ Shower/ Hygiene May shower and wash wound with soap and water. Edema Control - Lymphedema / SCD / Other Elevate legs to the level of the heart or above for 30 minutes daily and/or when sitting, a frequency of: - throughout the day Moisturize legs daily. Off-Loading Other: - Shoes on only when up walking, do not walk around bare foot Additional Orders / Instructions Follow Nutritious Diet - 100-120g of Protein Non Wound Condition Protect area with: - Right 5th toe with foam padding as needed for  protection. Home Health No change in wound care orders this week; continue Home Health for wound care. May utilize formulary equivalent dressing for wound treatment orders unless otherwise specified. Other Home Health Orders/Instructions: - [facility] Wound #1 - Dominguez Great oe Wound Laterality: Left Cleanser: Normal Saline (Home Health) Every Other Day/15 Days Discharge Instructions: Cleanse the wound with Normal Saline or wound cleanser prior to applying a clean dressing using gauze sponges, not tissue or cotton balls. Cleanser: Soap and Water [facility]) Every Other Day/15 Days Discharge Instructions: May shower and wash wound with dial antibacterial soap and water prior to dressing change. Prim Dressing: FIBRACOL Plus Dressing, 2x2 in (collagen) (Home Health) Every Other Day/15 Days ary Discharge Instructions: Moisten collagen with saline or hydrogel Secondary Dressing: Woven Gauze Sponge, Non-Sterile 4x4 in [facility]) Every Other Day/15 Days Discharge Instructions: Apply over primary dressing as directed. Secondary Dressing: Felt 2.5 yds x 5.5 in [facility]) Every Other Day/15 Days Discharge Instructions: Apply as donut over primary dressing Secured With: Conforming Stretch Gauze Bandage, Sterile 2x75 (in/in) (Home Health) Every Other Day/15 Days Discharge Instructions: Secure with stretch gauze as directed. Secured With: 64M Medipore H Soft Cloth Surgical Dominguez 4 x 2 (in/yd) (Home Health) Every Other Day/15 Days ape Discharge Instructions: Secure dressing with tape as directed. Electronic Signature(s) Signed: 02/15/2021 2:02:28 PM By: Heidi Shan DO Entered By: Heidi Dominguez on 02/15/2021 13:56:28 -------------------------------------------------------------------------------- Problem List Details Patient Name: Date of Service: Heidi NTA GUE, [name] 02/15/2021 10:45 A M Medical Record Number: [id number] Patient Account Number: [id number] Date of  Birth/Sex: Treating RN: 05/24/1921 (87 y.o. Heidi Dominguez Primary Care Provider: Patrina Dominguez Other Clinician: Referring Provider: Treating Provider/Extender: Heidi Dominguez Dominguez in Treatment: 8 Active Problems ICD-10 Encounter Code Description Active Date MDM Diagnosis L97.529 Non-pressure chronic ulcer of other part of left foot with unspecified severity 12/16/2020 No Yes M86.9 Osteomyelitis, unspecified 12/31/2020 No Yes E11.621 Type 2 diabetes mellitus with  foot ulcer 12/16/2020 No Yes I10 Essential (primary) hypertension 12/16/2020 No Yes N05.39 Chronic diastolic (congestive) heart failure 12/16/2020 No Yes Inactive Problems Resolved Problems Electronic Signature(s) Signed: 02/15/2021 2:02:28 PM By: Heidi Shan DO Entered By: Heidi Dominguez on 02/15/2021 13:54:34 -------------------------------------------------------------------------------- Progress Note Details Patient Name: Date of Service: Heidi NTA GUE, [name] 02/15/2021 10:45 A M Medical Record Number: [id number] Patient Account Number: [id number] Date of Birth/Sex: Treating RN: 10/15/1920 (87 y.o. Heidi Dominguez Primary Care Provider: Patrina Dominguez Other Clinician: Referring Provider: Treating Provider/Extender: Heidi Dominguez Dominguez in Treatment: 8 Subjective Chief Complaint Information obtained from Patient Left great toe wound History of Present Illness (HPI) Admission 5/5 Ms. Dinkel is a 87 year old female with a past medical history of type 2 diabetes on oral agents, essential hypertension, chronic diastolic congestive heart failure and CKD 3 that presents to our office for a diabetic foot wound located to the left first great toe. Patient is accompanied by daughter and caregiver who helps provide the history. The wound started 1 year ago and has not shown signs of improvement. In fact they state that for the past month it has gotten worse. She has been following  with podiatry for this issue. She does not report having an image done of this foot. She states that recently she was placed on doxycycline because a home health nurse was concerned about an infection. She is using collagen every other day to the wound site. She currently denies any fever/chills, purulent drainage, increased warmth or erythema to the foot. 5/9; patient presents for 1 week follow-up. She states she feels well. She has been using collagen for dressing changes with no issues. She denies any acute signs of infection. She denies fever/chills, nausea/vomiting or drainage From the wound. She is scheduled for an MRI on 5/18 5/20; patient presents for 1 week follow-up. She has been using collagen dressing changes with no issues. She had the MRI of her foot done on 5/18. She denies any signs of local or systemic infection. 6/3; patient presents for 2-week follow-up. She saw infectious disease and was started on oral antibiotics. She currently denies signs of infection and overall feels well. She has been using collagen to the wound. She has no issues or complaints today. 6/22; patient presents for 2-week follow-up. She reports using collagen to the wound. She denies signs of infection. She has no issues or complaints today. 7/5; patient presents for 2-week follow-up. She reports using collagen to the wound. She states that she will be started on IV antibiotics by infectious disease. She denies signs of infection and she has no issues or complaints today. Patient History Information obtained from Patient. Family History Cancer - Siblings, Diabetes - Mother,Maternal Grandparents, Heart Disease - Mother, Hypertension - Mother, Stroke - Siblings, No family history of Hereditary Spherocytosis, Kidney Disease, Lung Disease, Seizures, Thyroid Problems, Tuberculosis. Social History Never smoker, Marital Status - Widowed, Alcohol Use - Never, Drug Use - No History, Caffeine Use - Daily. Medical  History Cardiovascular Patient has history of Congestive Heart Failure, Hypertension, Peripheral Arterial Disease, Phlebitis Endocrine Patient has history of Type II Diabetes Musculoskeletal Patient has history of Osteoarthritis Medical A Surgical History Notes nd Eyes Degenerative Eye Disease Respiratory Uses inhaler Gastrointestinal Hiatal Hernia Genitourinary CKD stage 3-Incontinence Objective Constitutional respirations regular, non-labored and within target range for patient.. Vitals Time Taken: 11:58 AM, Height: 64 in, Weight: 166 lbs, BMI: 28.5, Temperature: 98.9 F, Pulse: 96 bpm, Respiratory Rate: 18 breaths/min, Blood  Pressure: 148/84 mmHg. Cardiovascular 2+ dorsalis pedis/posterior tibialis pulses. Psychiatric pleasant and cooperative. General Notes: Left great toe: Open wound with probing to the bone. There is granulation tissue. No acute signs of soft tissue infection including purulent drainage increased warmth or erythema to the foot. Some nonviable tissue as to the edges of the wound bed present. Integumentary (Hair, Skin) Wound #1 status is Open. Original cause of wound was Other Lesion. The date acquired was: 11/24/2019. The wound has been in treatment 8 Dominguez. The wound is located on the Left Dominguez Great. The wound measures 0.4cm length x 1.7cm width x 0.5cm depth; 0.534cm^2 area and 0.267cm^3 volume. There is bone and oe Fat Layer (Subcutaneous Tissue) exposed. There is no tunneling noted, however, there is undermining starting at 6:00 and ending at 7:00 with a maximum distance of 0.3cm. There is a medium amount of serosanguineous drainage noted. The wound margin is well defined and not attached to the wound base. There is large (67-100%) red granulation within the wound bed. There is a small (1-33%) amount of necrotic tissue within the wound bed including Adherent Slough. Assessment Active Problems ICD-10 Non-pressure chronic ulcer of other part of left foot  with unspecified severity Osteomyelitis, unspecified Type 2 diabetes mellitus with foot ulcer Essential (primary) hypertension Chronic diastolic (congestive) heart failure Patient's wound is stable. I am able to probe to bone easily on exam. The bone feels brittle and fragmented. At this time patient and family does not want to do amputation. She is going to try a course of daptomycin. She has not received her PICC line yet. She has been on about 4 Dominguez of oral antibiotics. I will see her back in 3 Dominguez. She knows to call with any questions or concerns and can be seen sooner. Plan Follow-up Appointments: Return Appointment in 2 Dominguez. - with Dr. Heber [place] Bathing/ Shower/ Hygiene: May shower and wash wound with soap and water. Edema Control - Lymphedema / SCD / Other: Elevate legs to the level of the heart or above for 30 minutes daily and/or when sitting, a frequency of: - throughout the day Moisturize legs daily. Off-Loading: Other: - Shoes on only when up walking, do not walk around bare foot Additional Orders / Instructions: Follow Nutritious Diet - 100-120g of Protein Non Wound Condition: Protect area with: - Right 5th toe with foam padding as needed for protection. Home Health: No change in wound care orders this week; continue Home Health for wound care. May utilize formulary equivalent dressing for wound treatment orders unless otherwise specified. Other Home Health Orders/Instructions: - [facility] WOUND #1: - Dominguez Great Wound Laterality: Left oe Cleanser: Normal Saline (Home Health) Every Other Day/15 Days Discharge Instructions: Cleanse the wound with Normal Saline or wound cleanser prior to applying a clean dressing using gauze sponges, not tissue or cotton balls. Cleanser: Soap and Water [facility]) Every Other Day/15 Days Discharge Instructions: May shower and wash wound with dial antibacterial soap and water prior to dressing change. Prim Dressing: FIBRACOL Plus  Dressing, 2x2 in (collagen) (Home Health) Every Other Day/15 Days ary Discharge Instructions: Moisten collagen with saline or hydrogel Secondary Dressing: Woven Gauze Sponge, Non-Sterile 4x4 in [facility]) Every Other Day/15 Days Discharge Instructions: Apply over primary dressing as directed. Secondary Dressing: Felt 2.5 yds x 5.5 in [facility]) Every Other Day/15 Days Discharge Instructions: Apply as donut over primary dressing Secured With: Conforming Stretch Gauze Bandage, Sterile 2x75 (in/in) (Home Health) Every Other Day/15 Days Discharge Instructions: Secure with stretch  gauze as directed. Secured With: 35M Medipore H Soft Cloth Surgical Dominguez 4 x 2 (in/yd) (Home Health) Every Other Day/15 Days ape Discharge Instructions: Secure dressing with tape as directed. 1. Collagen 2. Follow-up in 3 Dominguez Electronic Signature(s) Signed: 02/15/2021 2:02:28 PM By: Heidi Shan DO Entered By: Heidi Dominguez on 02/15/2021 14:01:41 -------------------------------------------------------------------------------- HxROS Details Patient Name: Date of Service: Heidi NTA GUE, [name] 02/15/2021 10:45 A M Medical Record Number: [id number] Patient Account Number: [id number] Date of Birth/Sex: Treating RN: 1921-06-26 (87 y.o. Heidi Dominguez Primary Care Provider: Patrina Dominguez Other Clinician: Referring Provider: Treating Provider/Extender: Heidi Dominguez Dominguez in Treatment: 8 Information Obtained From Patient Eyes Medical History: Past Medical History Notes: Degenerative Eye Disease Respiratory Medical History: Past Medical History Notes: Uses inhaler Cardiovascular Medical History: Positive for: Congestive Heart Failure; Hypertension; Peripheral Arterial Disease; Phlebitis Gastrointestinal Medical History: Past Medical History Notes: Hiatal Hernia Endocrine Medical History: Positive for: Type II Diabetes Time with diabetes: 30+ years Treated with: Oral  agents Blood sugar tested every day: Yes Tested : QOD Genitourinary Medical History: Past Medical History Notes: CKD stage 3-Incontinence Musculoskeletal Medical History: Positive for: Osteoarthritis Immunizations Pneumococcal Vaccine: Received Pneumococcal Vaccination: Yes Implantable Devices None Family and Social History Cancer: Yes - Siblings; Diabetes: Yes - Mother,Maternal Grandparents; Heart Disease: Yes - Mother; Hereditary Spherocytosis: No; Hypertension: Yes - Mother; Kidney Disease: No; Lung Disease: No; Seizures: No; Stroke: Yes - Siblings; Thyroid Problems: No; Tuberculosis: No; Never smoker; Marital Status - Widowed; Alcohol Use: Never; Drug Use: No History; Caffeine Use: Daily; Financial Concerns: No; Food, Clothing or Shelter Needs: No; Support System Lacking: No; Transportation Concerns: No Electronic Signature(s) Signed: 02/15/2021 2:02:28 PM By: Heidi Shan DO Signed: 02/15/2021 6:55:28 PM By: Deon Pilling Entered By: Heidi Dominguez on 02/15/2021 13:55:43 -------------------------------------------------------------------------------- SuperBill Details Patient Name: Date of Service: Heidi NTA GUE, [name] 02/15/2021 Medical Record Number: [id number] Patient Account Number: [id number] Date of Birth/Sex: Treating RN: 1921-04-27 (87 y.o. Heidi Dominguez Primary Care Provider: Lajean Manes Dominguez Other Clinician: Referring Provider: Treating Provider/Extender: Heidi Dominguez in Treatment: 8 Diagnosis Coding ICD-10 Codes Code Description [phone] Non-pressure chronic ulcer of other part of left foot with unspecified severity M86.9 Osteomyelitis, unspecified E11.621 Type 2 diabetes mellitus with foot ulcer I10 Essential (primary) hypertension J85.63 Chronic diastolic (congestive) heart failure Facility Procedures CPT4 Code: [id number] Description: 78588 - WOUND CARE VISIT-LEV 4 EST PT Modifier: Quantity: 1 Physician  Procedures Electronic Signature(s) Signed: 02/15/2021 2:02:28 PM By: Heidi Shan DO Entered By: Heidi Dominguez on 02/15/2021 14:02:05

## 2021-02-15 NOTE — Progress Notes (Addendum)
NYEMAH, WATTON ([id number]) Visit Report for 02/15/2021 Arrival Information Details Patient Name: Date of Service: [name], [name] 02/15/2021 10:45 A M Medical Record Number: [id number] Patient Account Number: [id number] Date of Birth/Sex: Treating RN: 08-May-1921 (87 y.o. Helene Shoe, Meta.Reding Primary Care Tymara Saur: Lajean Manes T Other Clinician: Referring Derwin Reddy: Treating Mikella Linsley/Extender: [name] Weeks in Treatment: 8 Visit Information History Since Last Visit Added or deleted any medications: No Patient Arrived: Wheel Chair Any new allergies or adverse reactions: No Arrival Time: 11:52 Had a fall or experienced change in No Accompanied By: self activities of daily living that may affect Transfer Assistance: None risk of falls: Patient Identification Verified: Yes Signs or symptoms of abuse/neglect since last visito No Secondary Verification Process Completed: Yes Hospitalized since last visit: No Patient Requires Transmission-Based No Implantable device outside of the clinic excluding No Precautions: cellular tissue based products placed in the center Patient Has Alerts: Yes since last visit: Patient Alerts: Patient on Blood Thinner Has Dressing in Place as Prescribed: Yes ABI RandL=Non Pain Present Now: No Compressible TBI R=0.87 L=0.62 Electronic Signature(s) Signed: 02/15/2021 3:40:40 PM By: Sandre Kitty Entered By: Sandre Kitty on 02/15/2021 11:58:57 -------------------------------------------------------------------------------- Clinic Level of Care Assessment Details Patient Name: Date of Service: [name], [name] 02/15/2021 10:45 A M Medical Record Number: [id number] Patient Account Number: [id number] Date of Birth/Sex: Treating RN: [birth year] (87 y.o. Helene Shoe, Meta.Reding Primary Care Shadonna Benedick: Lajean Manes T Other Clinician: Referring Genoveva Singleton: Treating Quartez Lagos/Extender: [name] Weeks in  Treatment: 8 Clinic Level of Care Assessment Items TOOL 4 Quantity Score X- 1 0 Use when only an EandM is performed on FOLLOW-UP visit ASSESSMENTS - Nursing Assessment / Reassessment X- 1 10 Reassessment of Co-morbidities (includes updates in patient status) X- 1 5 Reassessment of Adherence to Treatment Plan ASSESSMENTS - Wound and Skin A ssessment / Reassessment X - Simple Wound Assessment / Reassessment - one wound 1 5 []  - 0 Complex Wound Assessment / Reassessment - multiple wounds X- 1 10 Dermatologic / Skin Assessment (not related to wound area) ASSESSMENTS - Focused Assessment X- 1 5 Circumferential Edema Measurements - multi extremities X- 1 10 Nutritional Assessment / Counseling / Intervention []  - 0 Lower Extremity Assessment (monofilament, tuning fork, pulses) []  - 0 Peripheral Arterial Disease Assessment (using hand held doppler) ASSESSMENTS - Ostomy and/or Continence Assessment and Care []  - 0 Incontinence Assessment and Management []  - 0 Ostomy Care Assessment and Management (repouching, etc.) PROCESS - Coordination of Care X - Simple Patient / Family Education for ongoing care 1 15 []  - 0 Complex (extensive) Patient / Family Education for ongoing care X- 1 10 Staff obtains Programmer, systems, Records, T Results / Process Orders est X- 1 10 Staff telephones HHA, Nursing Homes / Clarify orders / etc []  - 0 Routine Transfer to another Facility (non-emergent condition) []  - 0 Routine Hospital Admission (non-emergent condition) []  - 0 New Admissions / Biomedical engineer / Ordering NPWT Apligraf, etc. , []  - 0 Emergency Hospital Admission (emergent condition) X- 1 10 Simple Discharge Coordination []  - 0 Complex (extensive) Discharge Coordination PROCESS - Special Needs []  - 0 Pediatric / Minor Patient Management []  - 0 Isolation Patient Management []  - 0 Hearing / Language / Visual special needs []  - 0 Assessment of Community assistance (transportation,  D/C planning, etc.) []  - 0 Additional assistance / Altered mentation []  - 0 Support Surface(s) Assessment (bed, cushion, seat, etc.) INTERVENTIONS - Wound Cleansing /  Weeks of Treatment: [1:Open] [N/A:N/A] Wound Status: [1:0.4x1.7x0.5] [N/A:N/A] Measurements L x W x D (cm) [1:0.534] [N/A:N/A] A (cm) : rea [1:0.267] [N/A:N/A] Volume (cm) : [1:56.40%] [N/A:N/A] % Reduction in A rea: [1:88.50%] [N/A:N/A] % Reduction in Volume: [1:6] Starting Position 1 (o'clock): [1:7] Ending Position 1 (o'clock): [1:0.3] Maximum Distance 1 (cm): [1:Yes] [N/A:N/A] Undermining: [1:Grade 2] [N/A:N/A] Classification: [1:Medium] [N/A:N/A] Exudate A mount: [1:Serosanguineous] [N/A:N/A] Exudate Type: [1:red, brown] [N/A:N/A] Exudate Color: [1:Well defined, not attached] [N/A:N/A] Wound Margin: [1:Large (67-100%)] [N/A:N/A] Granulation A mount: [1:Red] [N/A:N/A] Granulation Quality: [1:Small (1-33%)] [N/A:N/A] Necrotic A mount: [1:Fat Layer (Subcutaneous Tissue): Yes N/A] Exposed Structures: [1:Bone: Yes Fascia: No Tendon: No Muscle: No Joint: No None] [N/A:N/A] Treatment Notes Electronic Signature(s) Signed: 02/15/2021 2:02:28 PM By: Kalman Shan DO Signed: 02/15/2021 6:55:28 PM By: Deon Pilling Signed: 02/15/2021 6:55:28 PM By: Deon Pilling Entered By: Kalman Shan on 02/15/2021 13:54:38 -------------------------------------------------------------------------------- Multi-Disciplinary Care Plan Details Patient Name: Date of Service: [name], [name] 02/15/2021 10:45 A M Medical Record Number: [id number] Patient Account Number:  [id number] Date of Birth/Sex: Treating RN: November 08, 1920 (87 y.o. Debby Bud Primary Care Rawan Riendeau: Patrina Levering Other Clinician: Referring Artia Singley: Treating Lenford Beddow/Extender: [name] in Treatment: 8 Active Inactive Wound/Skin Impairment Nursing Diagnoses: Impaired tissue integrity Goals: Patient/caregiver will verbalize understanding of skin care regimen Date Initiated: 12/16/2020 Target Resolution Date: 03/09/2021 Goal Status: Active Ulcer/skin breakdown will have a volume reduction of 30% by week 4 Date Initiated: 12/16/2020 Date Inactivated: 02/02/2021 Target Resolution Date: 01/21/2021 Goal Status: Unmet Unmet Reason: Osteomyelitis Interventions: Assess patient/caregiver ability to obtain necessary supplies Assess patient/caregiver ability to perform ulcer/skin care regimen upon admission and as needed Assess ulceration(s) every visit Provide education on ulcer and skin care Treatment Activities: Skin care regimen initiated : 12/16/2020 Topical wound management initiated : 12/16/2020 Notes: Electronic Signature(s) Signed: 02/15/2021 6:55:28 PM By: Deon Pilling Entered By: Deon Pilling on 02/15/2021 11:15:57 -------------------------------------------------------------------------------- Pain Assessment Details Patient Name: Date of Service: [name], [name] 02/15/2021 10:45 A M Medical Record Number: [id number] Patient Account Number: [id number] Date of Birth/Sex: Treating RN: [birth year] (87 y.o. Debby Bud Primary Care Peta Peachey: Patrina Levering Other Clinician: Referring Ezel Vallone: Treating Lawana Hartzell/Extender: [name] in Treatment: 8 Active Problems Location of Pain Severity and Description of Pain Patient Has Paino No Site Locations Pain Management and Medication Current Pain Management: Electronic Signature(s) Signed: 02/15/2021 3:40:40 PM By: Sandre Kitty Signed: 02/15/2021 6:55:28 PM  By: Deon Pilling Entered By: Sandre Kitty on 02/15/2021 11:59:37 -------------------------------------------------------------------------------- Patient/Caregiver Education Details Patient Name: Date of Service: [name], [name] 7/5/2022andnbsp10:45 A M Medical Record Number: [id number] Patient Account Number: [id number] Date of Birth/Gender: Treating RN: 10-04-1920 (87 y.o. Debby Bud Primary Care Physician: Lajean Manes T Other Clinician: Referring Physician: Treating Physician/Extender: Eugenio Hoes in Treatment: 8 Education Assessment Education Provided To: Patient Education Topics Provided Wound/Skin Impairment: Handouts: Skin Care Do's and Dont's Methods: Explain/Verbal Responses: Reinforcements needed Electronic Signature(s) Signed: 02/15/2021 6:55:28 PM By: Deon Pilling Entered By: Deon Pilling on 02/15/2021 11:16:08 -------------------------------------------------------------------------------- Wound Assessment Details Patient Name: Date of Service: [name], [name] 02/15/2021 10:45 A M Medical Record Number: [id number] Patient Account Number: [id number] Date of Birth/Sex: Treating RN: Jun 16, 1921 (87 y.o. Debby Bud Primary Care Mayvis Agudelo: Patrina Levering Other Clinician: Referring Tashawna Thom: Treating Elvie Maines/Extender: [name] in Treatment: 8 Wound Status Wound Number: 1 Primary Diabetic  Wound/Ulcer of the Lower Extremity Etiology: Wound Location: Left T Great oe Wound Open Wounding Event: Other Lesion Status: Date Acquired: 11/24/2019 Comorbid Congestive Heart Failure, Hypertension, Peripheral Arterial Weeks Of Treatment: 8 History: Disease, Phlebitis, Type II Diabetes, Osteoarthritis Clustered Wound: No Photos Wound Measurements Length: (cm) 0.4 Width: (cm) 1.7 Depth: (cm) 0.5 Area: (cm) 0.534 Volume: (cm) 0.267 % Reduction in Area: 56.4% % Reduction in Volume:  88.5% Epithelialization: None Tunneling: No Undermining: Yes Starting Position (o'clock): 6 Ending Position (o'clock): 7 Maximum Distance: (cm) 0.3 Wound Description Classification: Grade 2 Wound Margin: Well defined, not attached Exudate Amount: Medium Exudate Type: Serosanguineous Exudate Color: red, brown Foul Odor After Cleansing: No Slough/Fibrino No Wound Bed Granulation Amount: Large (67-100%) Exposed Structure Granulation Quality: Red Fascia Exposed: No Necrotic Amount: Small (1-33%) Fat Layer (Subcutaneous Tissue) Exposed: Yes Necrotic Quality: Adherent Slough Tendon Exposed: No Muscle Exposed: No Joint Exposed: No Bone Exposed: Yes Treatment Notes Wound #1 (Toe Great) Wound Laterality: Left Cleanser Normal Saline Discharge Instruction: Cleanse the wound with Normal Saline or wound cleanser prior to applying a clean dressing using gauze sponges, not tissue or cotton balls. Soap and Water Discharge Instruction: May shower and wash wound with dial antibacterial soap and water prior to dressing change. Peri-Wound Care Topical Primary Dressing FIBRACOL Plus Dressing, 2x2 in (collagen) Discharge Instruction: Moisten collagen with saline or hydrogel Secondary Dressing Woven Gauze Sponge, Non-Sterile 4x4 in Discharge Instruction: Apply over primary dressing as directed. Felt 2.5 yds x 5.5 in Discharge Instruction: Apply as donut over primary dressing Secured With Conforming Stretch Gauze Bandage, Sterile 2x75 (in/in) Discharge Instruction: Secure with stretch gauze as directed. 53M Medipore H Soft Cloth Surgical T 4 x 2 (in/yd) ape Discharge Instruction: Secure dressing with tape as directed. Compression Wrap Compression Stockings Add-Ons Electronic Signature(s) Signed: 02/16/2021 5:22:20 PM By: Sandre Kitty Signed: 02/16/2021 6:50:03 PM By: Deon Pilling Previous Signature: 02/15/2021 6:55:28 PM Version By: Deon Pilling Entered By: Sandre Kitty on  02/16/2021 14:45:03 -------------------------------------------------------------------------------- Vitals Details Patient Name: Date of Service: [name], [name] 02/15/2021 10:45 A M Medical Record Number: [id number] Patient Account Number: [id number] Date of Birth/Sex: Treating RN: June 01, 1921 (87 y.o. Debby Bud Primary Care Eulogio Requena: Lajean Manes T Other Clinician: Referring Dalylah Ramey: Treating Subrena Devereux/Extender: [name] in Treatment: 8 Vital Signs Time Taken: 11:58 Temperature (F): 98.9 Height (in): 64 Pulse (bpm): 96 Weight (lbs): 166 Respiratory Rate (breaths/min): 18 Body Mass Index (BMI): 28.5 Blood Pressure (mmHg): 148/84 Reference Range: 80 - 120 mg / dl Electronic Signature(s) Signed: 02/15/2021 3:40:40 PM By: Sandre Kitty Entered By: Sandre Kitty on 02/15/2021 11:59:15  Wound/Ulcer of the Lower Extremity Etiology: Wound Location: Left T Great oe Wound Open Wounding Event: Other Lesion Status: Date Acquired: 11/24/2019 Comorbid Congestive Heart Failure, Hypertension, Peripheral Arterial Weeks Of Treatment: 8 History: Disease, Phlebitis, Type II Diabetes, Osteoarthritis Clustered Wound: No Photos Wound Measurements Length: (cm) 0.4 Width: (cm) 1.7 Depth: (cm) 0.5 Area: (cm) 0.534 Volume: (cm) 0.267 % Reduction in Area: 56.4% % Reduction in Volume:  88.5% Epithelialization: None Tunneling: No Undermining: Yes Starting Position (o'clock): 6 Ending Position (o'clock): 7 Maximum Distance: (cm) 0.3 Wound Description Classification: Grade 2 Wound Margin: Well defined, not attached Exudate Amount: Medium Exudate Type: Serosanguineous Exudate Color: red, brown Foul Odor After Cleansing: No Slough/Fibrino No Wound Bed Granulation Amount: Large (67-100%) Exposed Structure Granulation Quality: Red Fascia Exposed: No Necrotic Amount: Small (1-33%) Fat Layer (Subcutaneous Tissue) Exposed: Yes Necrotic Quality: Adherent Slough Tendon Exposed: No Muscle Exposed: No Joint Exposed: No Bone Exposed: Yes Treatment Notes Wound #1 (Toe Great) Wound Laterality: Left Cleanser Normal Saline Discharge Instruction: Cleanse the wound with Normal Saline or wound cleanser prior to applying a clean dressing using gauze sponges, not tissue or cotton balls. Soap and Water Discharge Instruction: May shower and wash wound with dial antibacterial soap and water prior to dressing change. Peri-Wound Care Topical Primary Dressing FIBRACOL Plus Dressing, 2x2 in (collagen) Discharge Instruction: Moisten collagen with saline or hydrogel Secondary Dressing Woven Gauze Sponge, Non-Sterile 4x4 in Discharge Instruction: Apply over primary dressing as directed. Felt 2.5 yds x 5.5 in Discharge Instruction: Apply as donut over primary dressing Secured With Conforming Stretch Gauze Bandage, Sterile 2x75 (in/in) Discharge Instruction: Secure with stretch gauze as directed. 53M Medipore H Soft Cloth Surgical T 4 x 2 (in/yd) ape Discharge Instruction: Secure dressing with tape as directed. Compression Wrap Compression Stockings Add-Ons Electronic Signature(s) Signed: 02/16/2021 5:22:20 PM By: Sandre Kitty Signed: 02/16/2021 6:50:03 PM By: Deon Pilling Previous Signature: 02/15/2021 6:55:28 PM Version By: Deon Pilling Entered By: Sandre Kitty on  02/16/2021 14:45:03 -------------------------------------------------------------------------------- Vitals Details Patient Name: Date of Service: [name], [name] 02/15/2021 10:45 A M Medical Record Number: [id number] Patient Account Number: [id number] Date of Birth/Sex: Treating RN: June 01, 1921 (87 y.o. Debby Bud Primary Care Eulogio Requena: Lajean Manes T Other Clinician: Referring Dalylah Ramey: Treating Subrena Devereux/Extender: [name] in Treatment: 8 Vital Signs Time Taken: 11:58 Temperature (F): 98.9 Height (in): 64 Pulse (bpm): 96 Weight (lbs): 166 Respiratory Rate (breaths/min): 18 Body Mass Index (BMI): 28.5 Blood Pressure (mmHg): 148/84 Reference Range: 80 - 120 mg / dl Electronic Signature(s) Signed: 02/15/2021 3:40:40 PM By: Sandre Kitty Entered By: Sandre Kitty on 02/15/2021 11:59:15

## 2021-02-16 ENCOUNTER — Other Ambulatory Visit: Payer: Self-pay | Admitting: Student

## 2021-02-16 ENCOUNTER — Other Ambulatory Visit (HOSPITAL_COMMUNITY): Payer: Self-pay | Admitting: *Deleted

## 2021-02-17 ENCOUNTER — Other Ambulatory Visit: Payer: Self-pay

## 2021-02-17 ENCOUNTER — Other Ambulatory Visit: Payer: Self-pay | Admitting: Internal Medicine

## 2021-02-17 ENCOUNTER — Ambulatory Visit (HOSPITAL_COMMUNITY)
Admission: RE | Admit: 2021-02-17 | Discharge: 2021-02-17 | Disposition: A | Discharge status: Home / Self Care | Payer: Medicare Other | Source: Ambulatory Visit | Attending: Internal Medicine | Admitting: Internal Medicine

## 2021-02-17 DIAGNOSIS — M86172 Other acute osteomyelitis, left ankle and foot: Secondary | ICD-10-CM

## 2021-02-17 DIAGNOSIS — Z452 Encounter for adjustment and management of vascular access device: Secondary | ICD-10-CM | POA: Diagnosis not present

## 2021-02-17 MED ORDER — HEPARIN SOD (PORK) LOCK FLUSH 100 UNIT/ML IV SOLN
250.0000 [IU] | Freq: Every day | INTRAVENOUS | Status: DC
  Administered 2021-02-17: 250 [IU]

## 2021-02-17 MED ORDER — HEPARIN SOD (PORK) LOCK FLUSH 100 UNIT/ML IV SOLN: 250.0000 [IU] | INTRAVENOUS | Status: DC | PRN

## 2021-02-17 MED ORDER — SODIUM CHLORIDE 0.9 % IV SOLN
600.0000 mg | Freq: Once | INTRAVENOUS | Status: AC
  Administered 2021-02-17: 600 mg via INTRAVENOUS
  Filled 2021-02-17: qty 12

## 2021-02-17 MED ORDER — LIDOCAINE HCL 1 % IJ SOLN
INTRAMUSCULAR | Status: AC
  Filled 2021-02-17: qty 20

## 2021-02-17 MED ORDER — LIDOCAINE HCL (PF) 1 % IJ SOLN
INTRAMUSCULAR | Status: DC | PRN
  Administered 2021-02-17 (×2): 10 mL

## 2021-02-17 MED ORDER — HEPARIN SOD (PORK) LOCK FLUSH 100 UNIT/ML IV SOLN
INTRAVENOUS | Status: AC
  Filled 2021-02-17: qty 5

## 2021-02-17 NOTE — Procedures (Signed)
Interventional Radiology Procedure Note  Procedure: Placement of a left upper extremity midline IV access, via brachial vein.   Findings: 16cm midline, terminates axillary vein Limited venogram shows no continuous flow to the central veins via the access point of brachial vein. Tortuous collaterals in the axillary region.  Aspirates and flushes  Complications: None  Recommendations:  - Ok use - Do not submerge - Routine line care   Signed,  Dulcy Fanny. Earleen Newport, DO

## 2021-02-18 DIAGNOSIS — L97529 Non-pressure chronic ulcer of other part of left foot with unspecified severity: Secondary | ICD-10-CM | POA: Diagnosis not present

## 2021-02-18 DIAGNOSIS — E11621 Type 2 diabetes mellitus with foot ulcer: Secondary | ICD-10-CM | POA: Diagnosis not present

## 2021-02-18 DIAGNOSIS — E113293 Type 2 diabetes mellitus with mild nonproliferative diabetic retinopathy without macular edema, bilateral: Secondary | ICD-10-CM | POA: Diagnosis not present

## 2021-02-18 DIAGNOSIS — I5032 Chronic diastolic (congestive) heart failure: Secondary | ICD-10-CM | POA: Diagnosis not present

## 2021-02-18 DIAGNOSIS — I110 Hypertensive heart disease with heart failure: Secondary | ICD-10-CM | POA: Diagnosis not present

## 2021-02-18 DIAGNOSIS — E1140 Type 2 diabetes mellitus with diabetic neuropathy, unspecified: Secondary | ICD-10-CM | POA: Diagnosis not present

## 2021-02-20 DIAGNOSIS — M86172 Other acute osteomyelitis, left ankle and foot: Secondary | ICD-10-CM | POA: Diagnosis not present

## 2021-02-20 DIAGNOSIS — K219 Gastro-esophageal reflux disease without esophagitis: Secondary | ICD-10-CM | POA: Diagnosis not present

## 2021-02-20 DIAGNOSIS — I5032 Chronic diastolic (congestive) heart failure: Secondary | ICD-10-CM | POA: Diagnosis not present

## 2021-02-20 DIAGNOSIS — Z452 Encounter for adjustment and management of vascular access device: Secondary | ICD-10-CM | POA: Diagnosis not present

## 2021-02-20 DIAGNOSIS — E113293 Type 2 diabetes mellitus with mild nonproliferative diabetic retinopathy without macular edema, bilateral: Secondary | ICD-10-CM | POA: Diagnosis not present

## 2021-02-20 DIAGNOSIS — R269 Unspecified abnormalities of gait and mobility: Secondary | ICD-10-CM | POA: Diagnosis not present

## 2021-02-20 DIAGNOSIS — I110 Hypertensive heart disease with heart failure: Secondary | ICD-10-CM | POA: Diagnosis not present

## 2021-02-20 DIAGNOSIS — I340 Nonrheumatic mitral (valve) insufficiency: Secondary | ICD-10-CM | POA: Diagnosis not present

## 2021-02-20 DIAGNOSIS — L97529 Non-pressure chronic ulcer of other part of left foot with unspecified severity: Secondary | ICD-10-CM | POA: Diagnosis not present

## 2021-02-20 DIAGNOSIS — D696 Thrombocytopenia, unspecified: Secondary | ICD-10-CM | POA: Diagnosis not present

## 2021-02-20 DIAGNOSIS — Z9181 History of falling: Secondary | ICD-10-CM | POA: Diagnosis not present

## 2021-02-20 DIAGNOSIS — Z7984 Long term (current) use of oral hypoglycemic drugs: Secondary | ICD-10-CM | POA: Diagnosis not present

## 2021-02-20 DIAGNOSIS — E1140 Type 2 diabetes mellitus with diabetic neuropathy, unspecified: Secondary | ICD-10-CM | POA: Diagnosis not present

## 2021-02-20 DIAGNOSIS — Z79899 Other long term (current) drug therapy: Secondary | ICD-10-CM | POA: Diagnosis not present

## 2021-02-20 DIAGNOSIS — Z741 Need for assistance with personal care: Secondary | ICD-10-CM | POA: Diagnosis not present

## 2021-02-20 DIAGNOSIS — Z7982 Long term (current) use of aspirin: Secondary | ICD-10-CM | POA: Diagnosis not present

## 2021-02-20 DIAGNOSIS — I700 Atherosclerosis of aorta: Secondary | ICD-10-CM | POA: Diagnosis not present

## 2021-02-20 DIAGNOSIS — E11621 Type 2 diabetes mellitus with foot ulcer: Secondary | ICD-10-CM | POA: Diagnosis not present

## 2021-02-21 ENCOUNTER — Telehealth: Payer: Self-pay

## 2021-02-21 DIAGNOSIS — M86172 Other acute osteomyelitis, left ankle and foot: Secondary | ICD-10-CM | POA: Diagnosis not present

## 2021-02-21 DIAGNOSIS — I5032 Chronic diastolic (congestive) heart failure: Secondary | ICD-10-CM | POA: Diagnosis not present

## 2021-02-21 DIAGNOSIS — E113293 Type 2 diabetes mellitus with mild nonproliferative diabetic retinopathy without macular edema, bilateral: Secondary | ICD-10-CM | POA: Diagnosis not present

## 2021-02-21 DIAGNOSIS — L97529 Non-pressure chronic ulcer of other part of left foot with unspecified severity: Secondary | ICD-10-CM | POA: Diagnosis not present

## 2021-02-21 DIAGNOSIS — I110 Hypertensive heart disease with heart failure: Secondary | ICD-10-CM | POA: Diagnosis not present

## 2021-02-21 DIAGNOSIS — E11621 Type 2 diabetes mellitus with foot ulcer: Secondary | ICD-10-CM | POA: Diagnosis not present

## 2021-02-21 NOTE — Telephone Encounter (Signed)
Patient's daughter calls today to report that she took patient off aspirin due to IV antibiotics that are administered with heparin. The [facility] nurse asked her to call and verify that was okay with [place]. I advised her that she need to stay on aspirin and well as Plavix - even with the heparin administration. Explained that heparin works differently and also was likely only enough to keep the line open. She verbalizes understanding and will restart aspirin immediately.

## 2021-02-21 NOTE — Telephone Encounter (Signed)
Heather from patient's facility contacted team to discuss patient's PICC line. Reports the patient "hit it on something"yesterday and the insertion site is bruised with a "small knot" under the skin. She reports that they aren't able to draw back blood but can flush the line without problem. Instructed her that they may need to draw labs peripherally if the line cannot draw back for blood. She did not report any discharge or any additional exposed line.   Omarian Jaquith Lorita Officer, RN

## 2021-02-22 ENCOUNTER — Telehealth: Payer: Self-pay | Admitting: Infectious Disease

## 2021-02-22 DIAGNOSIS — I110 Hypertensive heart disease with heart failure: Secondary | ICD-10-CM | POA: Diagnosis not present

## 2021-02-22 DIAGNOSIS — E113293 Type 2 diabetes mellitus with mild nonproliferative diabetic retinopathy without macular edema, bilateral: Secondary | ICD-10-CM | POA: Diagnosis not present

## 2021-02-22 DIAGNOSIS — E11621 Type 2 diabetes mellitus with foot ulcer: Secondary | ICD-10-CM | POA: Diagnosis not present

## 2021-02-22 DIAGNOSIS — I5032 Chronic diastolic (congestive) heart failure: Secondary | ICD-10-CM | POA: Diagnosis not present

## 2021-02-22 DIAGNOSIS — M86172 Other acute osteomyelitis, left ankle and foot: Secondary | ICD-10-CM | POA: Diagnosis not present

## 2021-02-22 DIAGNOSIS — L97529 Non-pressure chronic ulcer of other part of left foot with unspecified severity: Secondary | ICD-10-CM | POA: Diagnosis not present

## 2021-02-22 NOTE — Telephone Encounter (Signed)
I spoke with Lauren the nurse with [facility]. She went out to evaluate patient's picc line and she stated patient's picc line is flushing well, no swelling or redness around the picc, patient denies any pain in the arm. Lauren stated she noticed some bruising  around the picc and there is no blood return. Orders given to Lauren to do peripheral sticks for labs on the patient due to no blood return from the picc line. Lauren verbalized understanding. Lauren stated she feels the picc line is fine and patient does not need to come in to have the picc line evaluated. Heidi Dominguez

## 2021-02-22 NOTE — Telephone Encounter (Signed)
I received a phone call from the patient's daughter around 630 last night.  Apparently the home health company felt that the patient's line was not functioning properly.  The patient's daughter and the patient were not enthusiastic about going to the emergency department and less this was absolutely necessary.  I told her that I would let triage know about the problems they have had with the PICC line and see if we can potentially examine it here in clinic.  Will route to Dr. Juleen China as well.

## 2021-02-22 NOTE — Telephone Encounter (Signed)
I spoke with patient's daughter [name] and she states she is having another nurse from [facility] go and evaluate the patient's picc line. The nurse from [facility] will then call our office back to give [place] an update on the patient's picc line to determine if patient still needs to come in the office.  Yania Bogie T Brooks Sailors

## 2021-02-24 ENCOUNTER — Other Ambulatory Visit: Payer: Self-pay | Admitting: Internal Medicine

## 2021-02-27 DIAGNOSIS — I5032 Chronic diastolic (congestive) heart failure: Secondary | ICD-10-CM | POA: Diagnosis not present

## 2021-02-27 DIAGNOSIS — E11621 Type 2 diabetes mellitus with foot ulcer: Secondary | ICD-10-CM | POA: Diagnosis not present

## 2021-02-27 DIAGNOSIS — L97529 Non-pressure chronic ulcer of other part of left foot with unspecified severity: Secondary | ICD-10-CM | POA: Diagnosis not present

## 2021-02-27 DIAGNOSIS — I110 Hypertensive heart disease with heart failure: Secondary | ICD-10-CM | POA: Diagnosis not present

## 2021-02-27 DIAGNOSIS — M86172 Other acute osteomyelitis, left ankle and foot: Secondary | ICD-10-CM | POA: Diagnosis not present

## 2021-02-27 DIAGNOSIS — E113293 Type 2 diabetes mellitus with mild nonproliferative diabetic retinopathy without macular edema, bilateral: Secondary | ICD-10-CM | POA: Diagnosis not present

## 2021-03-01 ENCOUNTER — Ambulatory Visit (INDEPENDENT_AMBULATORY_CARE_PROVIDER_SITE_OTHER): Payer: Medicare Other | Admitting: Internal Medicine

## 2021-03-01 ENCOUNTER — Other Ambulatory Visit: Payer: Self-pay

## 2021-03-01 ENCOUNTER — Telehealth: Payer: Self-pay

## 2021-03-01 ENCOUNTER — Encounter: Payer: Self-pay | Admitting: Internal Medicine

## 2021-03-01 VITALS — Temp 98.0°F

## 2021-03-01 DIAGNOSIS — I5032 Chronic diastolic (congestive) heart failure: Secondary | ICD-10-CM | POA: Diagnosis not present

## 2021-03-01 DIAGNOSIS — M86172 Other acute osteomyelitis, left ankle and foot: Secondary | ICD-10-CM

## 2021-03-01 DIAGNOSIS — I110 Hypertensive heart disease with heart failure: Secondary | ICD-10-CM | POA: Diagnosis not present

## 2021-03-01 DIAGNOSIS — L97529 Non-pressure chronic ulcer of other part of left foot with unspecified severity: Secondary | ICD-10-CM | POA: Diagnosis not present

## 2021-03-01 DIAGNOSIS — E113293 Type 2 diabetes mellitus with mild nonproliferative diabetic retinopathy without macular edema, bilateral: Secondary | ICD-10-CM | POA: Diagnosis not present

## 2021-03-01 DIAGNOSIS — E11621 Type 2 diabetes mellitus with foot ulcer: Secondary | ICD-10-CM | POA: Diagnosis not present

## 2021-03-01 NOTE — Assessment & Plan Note (Signed)
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  PICC line was placed 02/17/21 with planned end date of 03/31/21.  Discussed again at length the chances of cure for this type of long standing infection with antibiotics alone can be challenging without some sort of debridement or surgical procedure to get rid of the necrotic tissue.  Fortunately, she is s/p vascular procedure to improve blood flow earlier this year to help with healing and wound appears to be improving.  I reviewed recent OPAT labs which showed normal WBC and CRP is also normalized as of 02/22/21.  She will continue with wound care, off loading, and antibiotics.  Will see her back in 4 weeks at end of therapy.  At that time will discuss stopping her antibiotics all together as "test of cure" vs transition to PO suppression for a period of time.  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  She will discuss this further with her orthopedic surgeon.  If we do suppression for her after IV antibiotics, Doxycycline would cover the MRSA from bone cultures Left foot and may actually provide coverage against E coli but could not say with certainty.  I would not continue metronidazole after this initial treatment course.

## 2021-03-01 NOTE — Progress Notes (Signed)
[facility]  CHIEF COMPLAINT:    Follow up for MRSA OM of the foot  SUBJECTIVE:    Heidi Dominguez is a 87 y.o. female with PMHx as below who presents to the clinic for MRSA osteomyelitis of the foot.   She was last seen by me on 02/09/21.  After a long discussion with patient and family members regarding PO vs IV antibiotics treatment, they elected for IV antibiotics instead of PO.  She subsequently had a PICC line placed on 02/17/21 with IR and has been admitted to a SNF for her antibiotic course.  She is currently on daptomycin 75m/kg daily IV and metronidazole 5070mBID PO.  Baseline CK was 24, ESR and CRP on 6/29 was 17 and 21, respectively.  She presents today and reports doing okay over all.  She was seen by wound care on 7/5 who noted signs of new infection at that time. Her daughter reports that wound appears to be improving with improved closure and scabbing of wound.  There is no drainage at the moment.    Please see A&P for the details of today's visit and status of the patient's medical problems.   Patient's Medications  New Prescriptions   No medications on file  Previous Medications   ACETAMINOPHEN (TYLENOL) 650 MG CR TABLET    Take 1,300 mg by mouth every 8 (eight) hours as needed for pain.   ALBUTEROL (VENTOLIN HFA) 108 (90 BASE) MCG/ACT INHALER    Inhale 2 puffs into the lungs every 6 (six) hours as needed for wheezing or shortness of breath.   AMLODIPINE (NORVASC) 10 MG TABLET    Take 10 mg by mouth daily.   ASPIRIN EC 81 MG TABLET    Take 81 mg by mouth daily.   CARBOXYMETHYLCELLULOSE SOD PF (REFRESH PLUS) 0.5 % SOLN    Place 1 drop into both eyes daily as needed (eye injections).   CHOLECALCIFEROL (VITAMIN D-3) 125 MCG (5000 UT) TABS    Take 5,000 Units by mouth every Monday.   CIPROFLOXACIN (CIPRO) 500 MG TABLET    Take 500 mg by mouth every 12 (twelve) hours.   CLOPIDOGREL (PLAVIX) 75 MG TABLET    Take 1 tablet (75 mg total) by  mouth daily.   COLCHICINE 0.6 MG TABLET    Take 1 tablet (0.6 mg total) by mouth daily.   FAMOTIDINE (PEPCID) 20 MG TABLET    Take one after  supper   FENTANYL (DURAGESIC) 25 MCG/HR    Place 1 patch onto the skin every 3 (three) days.   FERROUS SULFATE 325 (65 FE) MG TABLET    Take 325 mg by mouth at bedtime.   FUROSEMIDE (LASIX) 20 MG TABLET    Take 20 mg by mouth daily.   JANUVIA 50 MG TABLET    Take 50 mg by mouth daily.    LEVALBUTEROL (XOPENEX) 1.25 MG/3ML NEBULIZER SOLUTION    Take 1.25 mg by nebulization every 4 (four) hours as needed for shortness of breath or wheezing.   LIDOCAINE (XYLOCAINE) 5 % OINTMENT    APPLY TOPICALLY AS NEEDED. TOES FOR PAIN   LORATADINE (CLARITIN) 10 MG TABLET    1 tablet   LOSARTAN (COZAAR) 25 MG TABLET    Take 25 mg by mouth daily.   LOSARTAN (COZAAR) 50 MG TABLET    TAKE 1 TABLET(50 MG) BY MOUTH DAILY   MAGNESIUM 250 MG TABS  METRONIDAZOLE (FLAGYL) 500 MG TABLET    TAKE 1 TABLET(500 MG) BY MOUTH TWICE DAILY   MULTIPLE VITAMIN (MULTIVITAMIN WITH MINERALS) TABS TABLET    Take 1 tablet by mouth daily.   MULTIPLE VITAMINS-MINERALS (IMMUNE SUPPORT PO)    Take 1 tablet by mouth daily.   OMEGA 3 1000 MG CAPS    1 capsule   ONDANSETRON (ZOFRAN) 4 MG TABLET       ONETOUCH VERIO TEST STRIP    USE TO CHECK BLOOD SUGAR ONCE DAILY.   PANTOPRAZOLE (PROTONIX) 40 MG TABLET    Take 1 tablet (40 mg total) by mouth daily. Take 30-60 min before first meal of the day   POTASSIUM CHLORIDE (K-DUR) 10 MEQ TABLET    Take 10 mEq by mouth daily.   SYSTANE COMPLETE 0.6 % SOLN    Place 2 drops into both eyes 2 (two) times daily.   VITAMIN B-12 (CYANOCOBALAMIN) 1000 MCG TABLET    Take 1,000 mcg by mouth every Sunday.   WOUND DRESSINGS (FIBRACOL EX)    Apply 1 patch topically every other day.   WOUND DRESSINGS (PROMOGRAN EX)    Apply 1 patch topically every other day.  Modified Medications   No medications on file  Discontinued Medications   No medications on file      Past  Medical History:  Diagnosis Date   Acute pancreatitis    hx of    Arthritis    "qwhere" (07/04/2018)   Chronic back pain    "all over" (07/04/2018)   Chronic diastolic CHF (congestive heart failure) (HCC)    Chronic kidney disease    stage  3 chronic kidney disease    Complication of anesthesia    "I have a hard time waking up"   Degenerative joint disease of shoulder region    Dry eye syndrome    Dyspnea    Family history of adverse reaction to anesthesia    "daughter has the shakes when she wakes up"   GERD (gastroesophageal reflux disease)    Heart murmur    High cholesterol    History of hiatal hernia    HOH (hard of hearing)    Hypertension    Impacted cerumen of both ears    hx of    Muscle weakness (generalized)    Osteoarthritis    Overactive bladder    Pancreatitis    hx of    Phlebitis    "BLE"   Rotator cuff tear    right    Tear of right supraspinatus tendon    Thrombocytopenia (HCC)    hx of    Type II diabetes mellitus (HCC)    Urinary tract infection    hx of    Xerosis cutis    hx of     Social History   Tobacco Use   Smoking status: Never   Smokeless tobacco: Never  Vaping Use   Vaping Use: Never used  Substance Use Topics   Alcohol use: No    Comment: rare   Drug use: No    Family History  Problem Relation Age of Onset   Hypertension Mother    Diabetes Mellitus I Mother    Hypertension Father     Allergies  Allergen Reactions   Amoxicillin-Pot Clavulanate Diarrhea   Catapres [Clonidine Hcl] Other (See Comments)    Unknown.    Codeine Other (See Comments)    "Makes me out of my head, loopy"   Hydrocodone Other (See Comments)    "  makes me go out of my head, loopy"   Lisinopril Cough   Other Other (See Comments)    Any type of narcotics Feels "loopy"    Sulfa Antibiotics Itching and Other (See Comments)   Sulfamethoxazole Diarrhea   Verapamil Diarrhea and Other (See Comments)   Sulfasalazine Itching    Review of  Systems  Constitutional:  Negative for chills and fever.  Respiratory: Negative.    Cardiovascular: Negative.   Gastrointestinal:  Positive for nausea. Negative for abdominal pain, diarrhea and vomiting.  Musculoskeletal:  Positive for joint pain.    OBJECTIVE:    Vitals:   03/01/21 1552  Temp: 98 F (36.7 C)  TempSrc: Oral   There is no height or weight on file to calculate BMI.  Physical Exam Constitutional:      General: She is not in acute distress.    Appearance: Normal appearance.  HENT:     Head: Normocephalic and atraumatic.  Pulmonary:     Effort: Pulmonary effort is normal. No respiratory distress.  Neurological:     General: No focal deficit present.     Mental Status: She is alert and oriented to person, place, and time.  Psychiatric:        Mood and Affect: Mood normal.        Behavior: Behavior normal.      Labs and Microbiology: CBC Latest Ref Rng & Units 02/09/2021 01/12/2021 12/22/2020  WBC 3.8 - 10.8 Thousand/uL 9.5 10.0 8.9  Hemoglobin 11.7 - 15.5 g/dL 16.0(H) 15.4 14.2  Hematocrit 35.0 - 45.0 % 48.7(H) 49.3(H) 44.8  Platelets 140 - 400 Thousand/uL 197 195 208   CMP Latest Ref Rng & Units 02/09/2021 01/12/2021 12/22/2020  Glucose 65 - 99 mg/dL 153(H) 138(H) -  BUN 7 - 25 mg/dL 10 14 -  Creatinine 0.60 - 0.88 mg/dL 0.81 1.17(H) 0.74  Sodium 135 - 146 mmol/L 135 137 -  Potassium 3.5 - 5.3 mmol/L 4.8 4.8 -  Chloride 98 - 110 mmol/L 95(L) 96(L) -  CO2 20 - 32 mmol/L 32 28 -  Calcium 8.6 - 10.4 mg/dL 10.2 10.6(H) -  Total Protein 6.1 - 8.1 g/dL 7.4 7.6 -  Total Bilirubin 0.2 - 1.2 mg/dL 0.5 0.5 -  Alkaline Phos 38 - 126 U/L - - -  AST 10 - 35 U/L 16 21 -  ALT 6 - 29 U/L 9 12 -       ASSESSMENT & PLAN:    Osteomyelitis ([place]) Patient currently on planned 6 week course of daptomycin IV plus PO metronidazole for left toe OM s/p bone cultures with MRSA.  PICC line was placed 02/17/21 with planned end date of 03/31/21.  Discussed again at length the  chances of cure for this type of long standing infection with antibiotics alone can be challenging without some sort of debridement or surgical procedure to get rid of the necrotic tissue.  Fortunately, she is s/p vascular procedure to improve blood flow earlier this year to help with healing and wound appears to be improving.  I reviewed recent OPAT labs which showed normal WBC and CRP is also normalized as of 02/22/21.  She will continue with wound care, off loading, and antibiotics.  Will see her back in 4 weeks at end of therapy.  At that time will discuss stopping her antibiotics all together as "test of cure" vs transition to PO suppression for a period of time.  She is also on ciprofloxacin since 2020 for an E coli  PJI of her shoulder that I would advocate she stop taking as well.  She will discuss this further with her orthopedic surgeon.  If we do suppression for her after IV antibiotics, Doxycycline would cover the MRSA from bone cultures Left foot and may actually provide coverage against E coli but could not say with certainty.  I would not continue metronidazole after this initial treatment course.       [facility] 03/01/2021, 4:48 PM   I spent 40 minutes dedicated to the care of this patient on the date of this encounter to include pre-visit review of records, face-to-face time with the patient discussing osteomyelitis>, and post-visit ordering of testing.

## 2021-03-01 NOTE — Patient Instructions (Signed)
Thank you for coming to see me today. It was a pleasure seeing you.  To Do: COntinue with IV antibiotic (Daptomycin) and oral Flagyl as is for now Follow up with me in 4 weeks  If you have any questions or concerns, please do not hesitate to call the office at [phone].  Take Care,   Heidi Dominguez

## 2021-03-02 ENCOUNTER — Telehealth: Payer: Self-pay | Admitting: Sports Medicine

## 2021-03-02 NOTE — Telephone Encounter (Signed)
Heidi Dominguez from [place] home [facility] calling to report to Dr. Cannon Kettle that home health missed appointment with patient last week for dressing change, and wasn't changed until Sunday.

## 2021-03-02 NOTE — Telephone Encounter (Signed)
ERROR

## 2021-03-03 ENCOUNTER — Encounter: Payer: Self-pay | Admitting: Sports Medicine

## 2021-03-03 ENCOUNTER — Other Ambulatory Visit: Payer: Self-pay

## 2021-03-03 ENCOUNTER — Ambulatory Visit (INDEPENDENT_AMBULATORY_CARE_PROVIDER_SITE_OTHER): Payer: Medicare Other | Admitting: Sports Medicine

## 2021-03-03 DIAGNOSIS — M79674 Pain in right toe(s): Secondary | ICD-10-CM | POA: Diagnosis not present

## 2021-03-03 DIAGNOSIS — I739 Peripheral vascular disease, unspecified: Secondary | ICD-10-CM

## 2021-03-03 DIAGNOSIS — B351 Tinea unguium: Secondary | ICD-10-CM

## 2021-03-03 DIAGNOSIS — M79675 Pain in left toe(s): Secondary | ICD-10-CM

## 2021-03-03 DIAGNOSIS — M869 Osteomyelitis, unspecified: Secondary | ICD-10-CM

## 2021-03-03 DIAGNOSIS — E1142 Type 2 diabetes mellitus with diabetic polyneuropathy: Secondary | ICD-10-CM

## 2021-03-03 DIAGNOSIS — L97502 Non-pressure chronic ulcer of other part of unspecified foot with fat layer exposed: Secondary | ICD-10-CM

## 2021-03-03 DIAGNOSIS — E08621 Diabetes mellitus due to underlying condition with foot ulcer: Secondary | ICD-10-CM

## 2021-03-03 NOTE — Progress Notes (Signed)
Subjective: Heidi Dominguez is a 87 y.o. female patient with history of diabetes who returns to office today for nail trim.  Patient is currently being seen at wound center for left great toe ulceration and is currently completing oral and PICC line antibiotics for osteomyelitis.  Patient denies any constitutional symptoms at this time.  Patient Active Problem List   Diagnosis Date Noted   Osteomyelitis ([place]) 01/12/2021   Peripheral artery disease ([place]) 12/22/2020   Diabetic foot ulcer ([place]) 09/02/2020   Diabetic neuropathy ([place]) 09/02/2020   Gait abnormality 09/02/2020   Diabetic retinopathy associated with type 2 diabetes mellitus ([place]) 09/02/2020   Hardening of the aorta (main artery of the heart) ([place]) 09/02/2020   Pure hypercholesterolemia 09/02/2020   Thrombocytopenia ([place]) 09/02/2020   Gastroesophageal reflux disease 10/22/2019   Unspecified chronic bronchitis ([place]) 09/02/2019   Infection or inflammatory reaction due to internal joint prosthesis ([place]) 02/20/2019   Rhinitis, chronic 01/07/2019   Infection of shoulder ([place]) 11/08/2018   History of hemiarthroplasty of right shoulder 09/25/2018   UTI (urinary tract infection) 09/12/2018   Acute pancreatitis 08/28/2018   Acute recurrent pancreatitis    Abdominal pain 08/23/2018   Elevated LFTs 07/04/2018   CKD (chronic kidney disease) stage 3, GFR 30-59 ml/min (HCC) 07/04/2018   Upper airway cough syndrome 03/13/2018   Sensorineural hearing loss (SNHL), bilateral 12/04/2016   Post-nasal drainage 03/14/2016   Presbycusis of both ears 03/14/2016   Bilateral hearing loss 10/06/2015   Bilateral impacted cerumen 10/06/2015   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   Subjective tinnitus of both ears 10/06/2015   Choledocholithiasis with chronic cholecystitis, nonobstructing 04/11/2015   Pre-operative cardiovascular examination, high risk surgery    Epigastric abdominal pain 04/08/2015   Acute gallstone pancreatitis  s/p lap chole w IOC 04/11/2015 04/08/2015   Diabetes mellitus type 2, controlled ([place]) 04/08/2015   Gallstone pancreatitis 04/08/2015   Mitral regurgitation 04/08/2015   Chronic diastolic CHF (congestive heart failure) ([place]) 04/08/2015   DOE (dyspnea on exertion) 03/31/2015   Ischemic colitis ([place]) 08/09/2012   Colitis 08/07/2012   Rectal bleeding 08/07/2012   Weakness 09/24/2011   Bradycardia 09/24/2011   Acute lower UTI 09/24/2011   Hematochezia 09/24/2011   Essential hypertension 09/24/2011   Dyslipidemia 09/24/2011   Current Outpatient Medications on File Prior to Visit  Medication Sig Dispense Refill   acetaminophen (TYLENOL) 650 MG CR tablet Take 1,300 mg by mouth every 8 (eight) hours as needed for pain.     albuterol (VENTOLIN HFA) 108 (90 Base) MCG/ACT inhaler Inhale 2 puffs into the lungs every 6 (six) hours as needed for wheezing or shortness of breath.     amLODipine (NORVASC) 10 MG tablet Take 10 mg by mouth daily.     aspirin EC 81 MG tablet Take 81 mg by mouth daily.     Carboxymethylcellulose Sod PF (REFRESH PLUS) 0.5 % SOLN Place 1 drop into both eyes daily as needed (eye injections).     Cholecalciferol (VITAMIN D-3) 125 MCG (5000 UT) TABS Take 5,000 Units by mouth every Monday.     ciprofloxacin (CIPRO) 500 MG tablet Take 500 mg by mouth every 12 (twelve) hours.     clopidogrel (PLAVIX) 75 MG tablet Take 1 tablet (75 mg total) by mouth daily. 90 tablet 3   colchicine 0.6 MG tablet Take 1 tablet (0.6 mg total) by mouth daily. 14 tablet 0   famotidine (PEPCID) 20 MG tablet Take one after  supper (Patient taking differently: Take  one after  supper) 30 tablet 2   fentaNYL (DURAGESIC) 25 MCG/HR Place 1 patch onto the skin every 3 (three) days.     ferrous sulfate 325 (65 FE) MG tablet Take 325 mg by mouth at bedtime.     furosemide (LASIX) 20 MG tablet Take 20 mg by mouth daily.     JANUVIA 50 MG tablet Take 50 mg by mouth daily.   5   levalbuterol (XOPENEX) 1.25 MG/3ML  nebulizer solution Take 1.25 mg by nebulization every 4 (four) hours as needed for shortness of breath or wheezing.     lidocaine (XYLOCAINE) 5 % ointment APPLY TOPICALLY AS NEEDED. TOES FOR PAIN 35.44 g 0   loratadine (CLARITIN) 10 MG tablet 1 tablet     losartan (COZAAR) 25 MG tablet Take 25 mg by mouth daily.     losartan (COZAAR) 50 MG tablet TAKE 1 TABLET(50 MG) BY MOUTH DAILY 30 tablet 0   Magnesium 250 MG TABS      metroNIDAZOLE (FLAGYL) 500 MG tablet TAKE 1 TABLET(500 MG) BY MOUTH TWICE DAILY 84 tablet 0   Multiple Vitamin (MULTIVITAMIN WITH MINERALS) TABS tablet Take 1 tablet by mouth daily.     Multiple Vitamins-Minerals (IMMUNE SUPPORT PO) Take 1 tablet by mouth daily.     Omega 3 1000 MG CAPS 1 capsule     ondansetron (ZOFRAN) 4 MG tablet      ONETOUCH VERIO test strip USE TO CHECK BLOOD SUGAR ONCE DAILY.     pantoprazole (PROTONIX) 40 MG tablet Take 1 tablet (40 mg total) by mouth daily. Take 30-60 min before first meal of the day (Patient taking differently: Take 40 mg by mouth 2 (two) times daily.) 30 tablet 5   potassium chloride (K-DUR) 10 MEQ tablet Take 10 mEq by mouth daily.     SYSTANE COMPLETE 0.6 % SOLN Place 2 drops into both eyes 2 (two) times daily.     vitamin B-12 (CYANOCOBALAMIN) 1000 MCG tablet Take 1,000 mcg by mouth every Sunday.     Wound Dressings (FIBRACOL EX) Apply 1 patch topically every other day.     Wound Dressings (PROMOGRAN EX) Apply 1 patch topically every other day.     No current facility-administered medications on file prior to visit.   Allergies  Allergen Reactions   Amoxicillin-Pot Clavulanate Diarrhea   Catapres [Clonidine Hcl] Other (See Comments)    Unknown.    Codeine Other (See Comments)    "Makes me out of my head, loopy"   Hydrocodone Other (See Comments)    "makes me go out of my head, loopy"   Lisinopril Cough   Other Other (See Comments)    Any type of narcotics Feels "loopy"    Sulfa Antibiotics Itching and Other (See  Comments)   Sulfamethoxazole Diarrhea   Verapamil Diarrhea and Other (See Comments)   Sulfasalazine Itching    Recent Results (from the past 2160 hour(s))  Resp Panel by RT-PCR (Flu A&B, Covid) Nasopharyngeal Swab     Status: None   Collection Time: 12/22/20  9:32 AM   Specimen: Nasopharyngeal Swab; Nasopharyngeal(NP) swabs in vial transport medium  Result Value Ref Range   SARS Coronavirus 2 by RT PCR NEGATIVE NEGATIVE    Comment: (NOTE) SARS-CoV-2 target nucleic acids are NOT DETECTED.  The SARS-CoV-2 RNA is generally detectable in upper respiratory specimens during the acute phase of infection. The lowest concentration of SARS-CoV-2 viral copies this assay can detect is 138 copies/mL. A negative result does not  preclude SARS-Cov-2 infection and should not be used as the sole basis for treatment or other patient management decisions. A negative result may occur with  improper specimen collection/handling, submission of specimen other than nasopharyngeal swab, presence of viral mutation(s) within the areas targeted by this assay, and inadequate number of viral copies(<138 copies/mL). A negative result must be combined with clinical observations, patient history, and epidemiological information. The expected result is Negative.  Fact Sheet for Patients:  EntrepreneurPulse.com.au  Fact Sheet for Healthcare Providers:  IncredibleEmployment.be  This test is no t yet approved or cleared by the [place] FDA and  has been authorized for detection and/or diagnosis of SARS-CoV-2 by FDA under an Emergency Use Authorization (EUA). This EUA will remain  in effect (meaning this test can be used) for the duration of the COVID-19 declaration under Section 564(b)(1) of the Act, 21 U.S.C.section 360bbb-3(b)(1), unless the authorization is terminated  or revoked sooner.       Influenza A by PCR NEGATIVE NEGATIVE   Influenza B by PCR NEGATIVE  NEGATIVE    Comment: (NOTE) The Xpert Xpress SARS-CoV-2/FLU/RSV plus assay is intended as an aid in the diagnosis of influenza from Nasopharyngeal swab specimens and should not be used as a sole basis for treatment. Nasal washings and aspirates are unacceptable for Xpert Xpress SARS-CoV-2/FLU/RSV testing.  Fact Sheet for Patients: EntrepreneurPulse.com.au  Fact Sheet for Healthcare Providers: IncredibleEmployment.be  This test is not yet approved or cleared by the [place] FDA and has been authorized for detection and/or diagnosis of SARS-CoV-2 by FDA under an Emergency Use Authorization (EUA). This EUA will remain in effect (meaning this test can be used) for the duration of the COVID-19 declaration under Section 564(b)(1) of the Act, 21 U.S.C. section 360bbb-3(b)(1), unless the authorization is terminated or revoked.  Performed at [facility], [place] [address] [place], [place] [address]   I-STAT, [place] 8     Status: Abnormal   Collection Time: 12/22/20 12:11 PM  Result Value Ref Range   Sodium 140 135 - 145 mmol/L   Potassium 3.9 3.5 - 5.1 mmol/L   Chloride 98 98 - 111 mmol/L   BUN 16 8 - 23 mg/dL   Creatinine, Ser 0.90 0.44 - 1.00 mg/dL   Glucose, Bld 135 (H) 70 - 99 mg/dL    Comment: Glucose reference range applies only to samples taken after fasting for at least 8 hours.   Calcium, Ion 1.35 1.15 - 1.40 mmol/L   TCO2 31 22 - 32 mmol/L   Hemoglobin 16.7 (H) 12.0 - 15.0 g/dL   HCT 49.0 (H) 36.0 - 46.0 %  Glucose, capillary     Status: Abnormal   Collection Time: 12/22/20  1:46 PM  Result Value Ref Range   Glucose-Capillary 125 (H) 70 - 99 mg/dL    Comment: Glucose reference range applies only to samples taken after fasting for at least 8 hours.  POCT Activated clotting time     Status: None   Collection Time: 12/22/20  5:07 PM  Result Value Ref Range   Activated Clotting Time 297 seconds  Glucose, capillary     Status:  Abnormal   Collection Time: 12/22/20  6:38 PM  Result Value Ref Range   Glucose-Capillary 161 (H) 70 - 99 mg/dL    Comment: Glucose reference range applies only to samples taken after fasting for at least 8 hours.  CBC     Status: None   Collection Time: 12/22/20  8:20 PM  Result Value Ref Range   WBC 8.9 4.0 - 10.5 K/uL   RBC 4.93 3.87 - 5.11 MIL/uL   Hemoglobin 14.2 12.0 - 15.0 g/dL   HCT 44.8 36.0 - 46.0 %   MCV 90.9 80.0 - 100.0 fL   MCH 28.8 26.0 - 34.0 pg   MCHC 31.7 30.0 - 36.0 g/dL   RDW 13.7 11.5 - 15.5 %   Platelets 208 150 - 400 K/uL   nRBC 0.0 0.0 - 0.2 %    Comment: Performed at [facility] [address], [place], [place] [address]  Creatinine, serum     Status: None   Collection Time: 12/22/20  8:20 PM  Result Value Ref Range   Creatinine, Ser 0.74 0.44 - 1.00 mg/dL   GFR, Estimated >60 >60 mL/min    Comment: (NOTE) Calculated using the CKD-EPI Creatinine Equation (2021) Performed at [facility] [address] [place], [place] [address]   CBC     Status: Abnormal   Collection Time: 01/12/21 12:00 AM  Result Value Ref Range   WBC 10.0 3.8 - 10.8 Thousand/uL   RBC 5.47 (H) 3.80 - 5.10 Million/uL   Hemoglobin 15.4 11.7 - 15.5 g/dL   HCT 49.3 (H) 35.0 - 45.0 %   MCV 90.1 80.0 - 100.0 fL   MCH 28.2 27.0 - 33.0 pg   MCHC 31.2 (L) 32.0 - 36.0 g/dL   RDW 12.7 11.0 - 15.0 %   Platelets 195 140 - 400 Thousand/uL   MPV 12.3 7.5 - 12.5 fL  COMPLETE METABOLIC PANEL WITH GFR     Status: Abnormal   Collection Time: 01/12/21 12:00 AM  Result Value Ref Range   Glucose, Bld 138 (H) 65 - 99 mg/dL    Comment: .            Fasting reference interval . For someone without known diabetes, a glucose value >125 mg/dL indicates that they may have diabetes and this should be confirmed with a follow-up test. .    BUN 14 7 - 25 mg/dL   Creat 1.17 (H) 0.60 - 0.88 mg/dL    Comment: For patients >35 years of age, the reference limit for Creatinine is  approximately 13% higher for people identified as African-American. .    GFR, Est Non African American 39 (L) > OR = 60 mL/min/1.50m   GFR, Est African American 45 (L) > OR = 60 mL/min/1.75m  BUN/Creatinine Ratio 12 6 - 22 (calc)   Sodium 137 135 - 146 mmol/L   Potassium 4.8 3.5 - 5.3 mmol/L   Chloride 96 (L) 98 - 110 mmol/L   CO2 28 20 - 32 mmol/L   Calcium 10.6 (H) 8.6 - 10.4 mg/dL   Total Protein 7.6 6.1 - 8.1 g/dL   Albumin 4.1 3.6 - 5.1 g/dL   Globulin 3.5 1.9 - 3.7 g/dL (calc)   AG Ratio 1.2 1.0 - 2.5 (calc)   Total Bilirubin 0.5 0.2 - 1.2 mg/dL   Alkaline phosphatase (APISO) 84 37 - 153 U/L   AST 21 10 - 35 U/L   ALT 12 6 - 29 U/L  Sedimentation rate     Status: None   Collection Time: 01/12/21 12:00 AM  Result Value Ref Range   Sed Rate 19 0 - 30 mm/h  C-reactive protein     Status: Abnormal   Collection Time: 01/12/21 12:00 AM  Result Value Ref Range   CRP 10.9 (H) <8.0 mg/L  Aerobic/Anaerobic Culture w Gram Stain (surgical/deep wound)     Status: None   Collection Time: 01/14/21  6:35 PM   Specimen: Toe  Result Value Ref Range   Specimen Description      TOE LEFT Performed at [facility], [place] [address] [place], [place] [address]    Special Requests      NONE Performed at [facility], [place] [address], [place], [place] [address]    Gram Stain NO WBC SEEN NO ORGANISMS SEEN     Culture      RARE METHICILLIN RESISTANT STAPHYLOCOCCUS AUREUS NO ANAEROBES ISOLATED Performed at [facility], [place] [address], [place], [place] [address]    Report Status 01/20/2021 FINAL    Organism ID, Bacteria METHICILLIN RESISTANT STAPHYLOCOCCUS AUREUS       Susceptibility   Methicillin resistant staphylococcus aureus - MIC*    CIPROFLOXACIN >=8 RESISTANT Resistant     ERYTHROMYCIN >=8 RESISTANT Resistant     GENTAMICIN <=0.5 SENSITIVE Sensitive     OXACILLIN >=4 RESISTANT Resistant     TETRACYCLINE <=1 SENSITIVE Sensitive      VANCOMYCIN <=0.5 SENSITIVE Sensitive     TRIMETH/SULFA >=320 RESISTANT Resistant     CLINDAMYCIN <=0.25 SENSITIVE Sensitive     RIFAMPIN <=0.5 SENSITIVE Sensitive     Inducible Clindamycin NEGATIVE Sensitive     * RARE METHICILLIN RESISTANT STAPHYLOCOCCUS AUREUS  Sedimentation rate     Status: None   Collection Time: 02/09/21  4:41 PM  Result Value Ref Range   Sed Rate 17 0 - 30 mm/h  C-reactive protein     Status: Abnormal   Collection Time: 02/09/21  4:41 PM  Result Value Ref Range   CRP 21.3 (H) <8.0 mg/L  CBC     Status: Abnormal   Collection Time: 02/09/21  4:41 PM  Result Value Ref Range   WBC 9.5 3.8 - 10.8 Thousand/uL   RBC 5.43 (H) 3.80 - 5.10 Million/uL   Hemoglobin 16.0 (H) 11.7 - 15.5 g/dL   HCT 48.7 (H) 35.0 - 45.0 %   MCV 89.7 80.0 - 100.0 fL   MCH 29.5 27.0 - 33.0 pg   MCHC 32.9 32.0 - 36.0 g/dL   RDW 13.2 11.0 - 15.0 %   Platelets 197 140 - 400 Thousand/uL   MPV 12.4 7.5 - 12.5 fL  Comp Met (CMET)     Status: Abnormal   Collection Time: 02/09/21  4:41 PM  Result Value Ref Range   Glucose, Bld 153 (H) 65 - 99 mg/dL    Comment: .            Fasting reference interval . For someone without known diabetes, a glucose value >125 mg/dL indicates that they may have diabetes and this should be confirmed with a follow-up test. .    BUN 10 7 - 25 mg/dL   Creat 0.81 0.60 - 0.88 mg/dL    Comment: For patients >65 years of age, the reference limit for Creatinine is approximately 13% higher for people identified as African-American. .    BUN/Creatinine Ratio NOT APPLICABLE 6 - 22 (calc)   Sodium 135 135 - 146 mmol/L   Potassium 4.8 3.5 - 5.3 mmol/L   Chloride 95 (L) 98 - 110 mmol/L   CO2 32 20 - 32 mmol/L   Calcium 10.2 8.6 - 10.4 mg/dL   Total Protein 7.4 6.1 - 8.1 g/dL   Albumin 3.9 3.6 - 5.1 g/dL   Globulin 3.5  1.9 - 3.7 g/dL (calc)   AG Ratio 1.1 1.0 - 2.5 (calc)   Total Bilirubin 0.5 0.2 - 1.2 mg/dL   Alkaline phosphatase (APISO) 66 37 - 153 U/L   AST  16 10 - 35 U/L   ALT 9 6 - 29 U/L  CK (Creatine Kinase)     Status: Abnormal   Collection Time: 02/09/21  4:41 PM  Result Value Ref Range   Total CK 24 (L) 29 - 143 U/L    Objective: General: Patient is awake, alert, and oriented x 3 and in no acute distress.  Integument: Skin is warm, dry and supple bilateral. Nails are tender, long, thickened and dystrophic with subungual debris, consistent with onychomycosis, 1-5 bilateral. No signs of infection.  Wound noted to the left great toe medial aspect with no surrounding redness warmth swelling or active drainage patient has wound care center and home nursing changing dressing.  Remaining integument unremarkable.  Vasculature:  Dorsalis Pedis pulse 1/4 bilateral. Posterior Tibial pulse  0/4 bilateral. Capillary fill time <3 sec 1-5 bilateral. No hair growth to the level of the digits.Temperature gradient within normal limits. + varicosities present bilateral. Trace edema present bilateral.   Neurology: Gross sensation present via light touch bilateral.   Musculoskeletal: + Bunion, hammertoes, pes planus deformities noted bilateral.   Assessment and Plan: Problem List Items Addressed This Visit       Endocrine   Diabetic foot ulcer ([place])   Other Visit Diagnoses     Pain due to onychomycosis of toenails of both feet    -  Primary   Osteomyelitis of great toe of left foot (HCC)       PAD (peripheral artery disease) ([place])       Diabetic peripheral neuropathy associated with type 2 diabetes mellitus ([place])          -Examined patient. -Re-discussed and educated patient on diabetic foot care, especially with regards to the vascular, neurological and musculoskeletal systems.  -Mechanically debrided all nails 1-5 bilateral using sterile nail nipper and filed with dremel without incident  -Replace dry dressing to the left hallux and advised patient to continue with wound care at the wound center and with home nursing as ordered -Continue with  medical management of osteomyelitis as directed by wound center and infectious disease -Continue with Diabetic shoes or shoes that do not rub toe -Patient to return for at risk foot care in 3 months  -Patient advised to call the office if any problems or questions arise in the meantime. -Discussed plan of care with patient's son and daughter via telephone.  Landis Martins, DPM

## 2021-03-07 ENCOUNTER — Other Ambulatory Visit: Payer: Self-pay

## 2021-03-07 ENCOUNTER — Encounter (HOSPITAL_BASED_OUTPATIENT_CLINIC_OR_DEPARTMENT_OTHER): Payer: Medicare Other | Admitting: Internal Medicine

## 2021-03-07 DIAGNOSIS — I5032 Chronic diastolic (congestive) heart failure: Secondary | ICD-10-CM | POA: Diagnosis not present

## 2021-03-07 DIAGNOSIS — I10 Essential (primary) hypertension: Secondary | ICD-10-CM

## 2021-03-07 DIAGNOSIS — L97529 Non-pressure chronic ulcer of other part of left foot with unspecified severity: Secondary | ICD-10-CM | POA: Diagnosis not present

## 2021-03-07 DIAGNOSIS — E11621 Type 2 diabetes mellitus with foot ulcer: Secondary | ICD-10-CM | POA: Diagnosis not present

## 2021-03-07 DIAGNOSIS — M869 Osteomyelitis, unspecified: Secondary | ICD-10-CM | POA: Diagnosis not present

## 2021-03-07 DIAGNOSIS — E1169 Type 2 diabetes mellitus with other specified complication: Secondary | ICD-10-CM | POA: Diagnosis not present

## 2021-03-07 DIAGNOSIS — I130 Hypertensive heart and chronic kidney disease with heart failure and stage 1 through stage 4 chronic kidney disease, or unspecified chronic kidney disease: Secondary | ICD-10-CM | POA: Diagnosis not present

## 2021-03-07 NOTE — Progress Notes (Addendum)
Heidi Dominguez, Heidi Dominguez ([id number]) Visit Report for 03/07/2021 Arrival Information Details Patient Name: Date of Service: Heidi NTA GUE, [name] 03/07/2021 10:45 A M Medical Record Number: [id number] Patient Account Number: [id number] Date of Birth/Sex: Treating RN: 1922-01-Dominguez (87 y.o. Heidi Dominguez Primary Care Heidi Dominguez: Heidi Dominguez Other Dominguez: Referring Heidi Dominguez: Treating Heidi Dominguez Visit Information History Since Last Visit Added or deleted any medications: No Patient Arrived: Wheel Chair Any new allergies or adverse reactions: No Arrival Time: Dominguez:05 Had a fall or experienced change in No Accompanied By: dgt activities of daily living that may affect Transfer Assistance: None risk of falls: Patient Identification Verified: Yes Signs or symptoms of abuse/neglect since last visito No Secondary Verification Process Completed: Yes Hospitalized since last visit: No Patient Requires Transmission-Based No Implantable device outside of the clinic excluding No Precautions: cellular tissue based products placed in the center Patient Has Alerts: Yes since last visit: Patient Alerts: Patient on Blood Thinner Has Dressing in Place as Prescribed: Yes ABI RandL=Non Pain Present Now: No Compressible TBI R=0.87 L=0.62 Electronic Signature(s) Signed: 03/07/2021 4:44:37 PM By: Heidi Dominguez Entered By: Heidi Deed on 03/07/2021 Dominguez:12:56 -------------------------------------------------------------------------------- Clinic Level of Care Assessment Details Patient Name: Date of Service: Heidi NTA GUE, [name] 03/07/2021 10:45 A M Medical Record Number: [id number] Patient Account Number: [id number] Date of Birth/Sex: Treating RN: 05-May-1921 (87 y.o. Heidi Dominguez Primary Care Heidi Dominguez: Heidi Dominguez Other Dominguez: Referring Heidi Dominguez: Treating Heidi Dominguez Clinic Level of Care Assessment Items TOOL 4 Quantity Score X- 1 0 Use when only an EandM is performed on FOLLOW-UP visit ASSESSMENTS - Nursing Assessment / Reassessment X- 1 10 Reassessment of Co-morbidities (includes updates in patient status) X- 1 5 Reassessment of Adherence to Treatment Plan ASSESSMENTS - Wound and Skin A ssessment / Reassessment X - Simple Wound Assessment / Reassessment - one wound 1 5 []  - 0 Complex Wound Assessment / Reassessment - multiple wounds []  - 0 Dermatologic / Skin Assessment (not related to wound area) ASSESSMENTS - Focused Assessment []  - 0 Circumferential Edema Measurements - multi extremities []  - 0 Nutritional Assessment / Counseling / Intervention []  - 0 Lower Extremity Assessment (monofilament, tuning fork, pulses) []  - 0 Peripheral Arterial Disease Assessment (using hand held doppler) ASSESSMENTS - Ostomy and/or Continence Assessment and Care []  - 0 Incontinence Assessment and Management []  - 0 Ostomy Care Assessment and Management (repouching, etc.) PROCESS - Coordination of Care []  - 0 Simple Patient / Family Education for ongoing care X- 1 20 Complex (extensive) Patient / Family Education for ongoing care X- 1 10 Staff obtains Chiropractor, Records, Dominguez Results / Process Orders est []  - 0 Staff telephones HHA, Nursing Homes / Clarify orders / etc []  - 0 Routine Transfer to another Facility (non-emergent condition) []  - 0 Routine Hospital Admission (non-emergent condition) []  - 0 New Admissions / Manufacturing engineer / Ordering NPWT Apligraf, etc. , []  - 0 Emergency Hospital Admission (emergent condition) []  - 0 Simple Discharge Coordination []  - 0 Complex (extensive) Discharge Coordination PROCESS - Special Needs []  - 0 Pediatric / Minor Patient Management []  - 0 Isolation Patient Management []  - 0 Hearing / Language / Visual special needs []  - 0 Assessment of Community assistance  (transportation, D/C planning, etc.) []  - 0 Additional assistance / Altered mentation []  - 0 Support Surface(s) Assessment (bed, cushion, seat, etc.) INTERVENTIONS - Wound  Cleansing / Measurement X - Simple Wound Cleansing - one wound 1 5 []  - 0 Complex Wound Cleansing - multiple wounds X- 1 5 Wound Imaging (photographs - any number of wounds) []  - 0 Wound Tracing (instead of photographs) X- 1 5 Simple Wound Measurement - one wound []  - 0 Complex Wound Measurement - multiple wounds INTERVENTIONS - Wound Dressings []  - 0 Small Wound Dressing one or multiple wounds X- 1 15 Medium Wound Dressing one or multiple wounds []  - 0 Large Wound Dressing one or multiple wounds []  - 0 Application of Medications - topical []  - 0 Application of Medications - injection INTERVENTIONS - Miscellaneous []  - 0 External ear exam []  - 0 Specimen Collection (cultures, biopsies, blood, body fluids, etc.) []  - 0 Specimen(s) / Culture(s) sent or taken to Lab for analysis []  - 0 Patient Transfer (multiple staff / Nurse, adult / Similar devices) []  - 0 Simple Staple / Suture removal (25 or less) []  - 0 Complex Staple / Suture removal (26 or more) []  - 0 Hypo / Hyperglycemic Management (close monitor of Blood Glucose) []  - 0 Ankle / Brachial Index (ABI) - do not check if billed separately X- 1 5 Vital Signs Has the patient been seen at the hospital within the last three years: Yes Total Score: 85 Level Of Care: New/Established - Level 3 Electronic Signature(s) Signed: 03/07/2021 3:40:56 PM By: Heidi Dominguez Entered By: Heidi Dominguez on 03/07/2021 12:05:23 -------------------------------------------------------------------------------- Encounter Discharge Information Details Patient Name: Date of Service: Heidi NTA GUE, [name] 03/07/2021 10:45 A M Medical Record Number: [id number] Patient Account Number: [id number] Date of Birth/Sex: Treating RN: Mar 27, 1921 (87 y.o. Heidi Dominguez Primary  Care Heidi Dominguez: Heidi Dominguez Other Dominguez: Referring Heidi Dominguez: Treating Heidi Dominguez in Treatment: 74 Encounter Discharge Information Items Discharge Condition: Stable Ambulatory Status: Wheelchair Discharge Destination: Home Transportation: Private Auto Accompanied By: Daughter Schedule Follow-up Appointment: Yes Clinical Summary of Care: Provided on 03/07/2021 Form Type Recipient Paper Patient Patient Electronic Signature(s) Signed: 03/07/2021 12:59:54 PM By: Heidi Dominguez Entered By: Heidi Dominguez on 03/07/2021 12:59:54 -------------------------------------------------------------------------------- Lower Extremity Assessment Details Patient Name: Date of Service: Heidi NTA GUE, [name] 03/07/2021 10:45 A M Medical Record Number: [id number] Patient Account Number: [id number] Date of Birth/Sex: Treating RN: [birth year] (87 y.o. Heidi Dominguez Primary Care Ridgely Anastacio: Heidi Dominguez Other Dominguez: Referring Christ Fullenwider: Treating Bree Heinzelman/Extender: Heide Scales Dominguez Weeks in Treatment: Dominguez Edema Assessment Assessed: [Left: No] [Right: No] Edema: [Left: N] [Right: o] Calf Left: Right: Point of Measurement: 31 cm From Medial Instep 31 cm Ankle Left: Right: Point of Measurement: 10 cm From Medial Instep 22 cm Vascular Assessment Pulses: Dorsalis Pedis Palpable: [Left:Yes] Electronic Signature(s) Signed: 03/07/2021 4:44:37 PM By: Heidi Dominguez Entered By: Heidi Deed on 03/07/2021 Dominguez:22:04 -------------------------------------------------------------------------------- Multi Wound Chart Details Patient Name: Date of Service: Heidi NTA GUE, [name] 03/07/2021 10:45 A M Medical Record Number: [id number] Patient Account Number: [id number] Date of Birth/Sex: Treating RN: [birth year] (87 y.o. Heidi Dominguez Primary Care Twana Wileman: Heidi Dominguez Other Dominguez: Referring Regis Wiland: Treating  Rylea Selway/Extender: Heide Scales Dominguez Weeks in Treatment: Dominguez Vital Signs Height(in): 64 Capillary Blood Glucose(mg/dl): 244 Weight(lbs): 010 Pulse(bpm): 98 Body Mass Index(BMI): 28 Blood Pressure(mmHg): 152/82 Temperature(F): 98.4 Respiratory Rate(breaths/min): 18 Photos: [N/A:N/A] Left Dominguez Great oe N/A N/A Wound Location: Other Lesion N/A N/A Wounding Event: Diabetic Wound/Ulcer of the Lower N/A N/A Primary Etiology: Extremity Congestive Heart Failure, N/A  N/A Comorbid History: Hypertension, Peripheral Arterial Disease, Phlebitis, Type II Diabetes, Osteoarthritis 11/24/2019 N/A N/A Date Acquired: Dominguez N/A N/A Weeks of Treatment: Open N/A N/A Wound Status: 1.2x1.7x0.2 N/A N/A Measurements L x W x D (cm) 1.602 N/A N/A A (cm) : rea 0.32 N/A N/A Volume (cm) : -30.80% N/A N/A % Reduction in A rea: 86.30% N/A N/A % Reduction in Volume: Grade 2 N/A N/A Classification: Medium N/A N/A Exudate A mount: Serosanguineous N/A N/A Exudate Type: red, brown N/A N/A Exudate Color: Distinct, outline attached N/A N/A Wound Margin: Medium (34-66%) N/A N/A Granulation A mount: Red N/A N/A Granulation Quality: Medium (34-66%) N/A N/A Necrotic A mount: Fat Layer (Subcutaneous Tissue): Yes N/A N/A Exposed Structures: Bone: Yes Fascia: No Tendon: No Muscle: No Joint: No Small (1-33%) N/A N/A Epithelialization: Treatment Notes Electronic Signature(s) Signed: 03/07/2021 1:04:00 PM By: Geralyn Corwin DO Signed: 03/07/2021 3:40:56 PM By: Heidi Dominguez Entered By: Geralyn Corwin on 03/07/2021 12:55:18 -------------------------------------------------------------------------------- Multi-Disciplinary Care Plan Details Patient Name: Date of Service: Heidi NTA GUE, [name] 03/07/2021 10:45 A M Medical Record Number: [id number] Patient Account Number: [id number] Date of Birth/Sex: Treating RN: 1920/09/21 (87 y.o. Heidi Dominguez Primary Care Daiwik [place]:  Heidi Dominguez Other Dominguez: Referring Vonne Mcdanel: Treating Bev Drennen/Extender: Heide Scales Dominguez Weeks in Treatment: Dominguez Active Inactive Electronic Signature(s) Signed: 04/11/2021 5:26:02 PM By: Heidi Dominguez Signed: 05/12/2021 4:51:27 PM By: Zandra Abts RN, Dominguez Previous Signature: 03/07/2021 Dominguez:02:Dominguez AM Version By: Heidi Dominguez Entered By: Zandra Abts on 04/08/2021 07:47:19 -------------------------------------------------------------------------------- Pain Assessment Details Patient Name: Date of Service: Heidi NTA GUE, [name] 03/07/2021 10:45 A M Medical Record Number: [id number] Patient Account Number: [id number] Date of Birth/Sex: Treating RN: 1921-02-11 (87 y.o. Heidi Dominguez Primary Care Anatalia Kronk: Heidi Dominguez Other Dominguez: Referring Hunter Bachar: Treating Cristian Grieves/Extender: Heide Scales Dominguez Weeks in Treatment: Dominguez Active Problems Location of Pain Severity and Description of Pain Patient Has Paino No Site Locations Rate the pain. Current Pain Level: 0 Pain Management and Medication Current Pain Management: Electronic Signature(s) Signed: 03/07/2021 4:44:37 PM By: Heidi Dominguez Entered By: Heidi Deed on 03/07/2021 Dominguez:13:53 -------------------------------------------------------------------------------- Patient/Caregiver Education Details Patient Name: Date of Service: Heidi NTA GUE, [name] 7/25/2022andnbsp10:45 A M Medical Record Number: [id number] Patient Account Number: [id number] Date of Birth/Gender: Treating RN: [birth year] (87 y.o. Heidi Dominguez Primary Care Physician: Heidi Dominguez Other Dominguez: Referring Physician: Treating Physician/Extender: Heidi Dominguez in Treatment: Dominguez Education Assessment Education Provided To: Patient and Caregiver Education Topics Provided Infection: Methods: Explain/Verbal, Printed Responses: State content  correctly Offloading: Methods: Explain/Verbal, Printed Responses: State content correctly Wound/Skin Impairment: Methods: Explain/Verbal, Printed Responses: State content correctly Electronic Signature(s) Signed: 03/07/2021 3:40:56 PM By: Heidi Dominguez Entered By: Heidi Dominguez on 03/07/2021 Dominguez:02:38 -------------------------------------------------------------------------------- Wound Assessment Details Patient Name: Date of Service: Heidi NTA GUE, [name] 03/07/2021 10:45 A M Medical Record Number: [id number] Patient Account Number: [id number] Date of Birth/Sex: Treating RN: [birth year] (87 y.o. Heidi Dominguez Primary Care Tawfiq Favila: Heidi Dominguez Other Dominguez: Referring Dariel Betzer: Treating Janeice Stegall/Extender: Heide Scales Dominguez Weeks in Treatment: Dominguez Wound Status Wound Number: 1 Primary Diabetic Wound/Ulcer of the Lower Extremity Etiology: Wound Location: Left Dominguez Great oe Wound Open Wounding Event: Other Lesion Status: Date Acquired: 11/24/2019 Comorbid Congestive Heart Failure, Hypertension, Peripheral Arterial Weeks Of Treatment: Dominguez History: Disease, Phlebitis, Type II Diabetes, Osteoarthritis Clustered Wound: No Photos Wound Measurements Length: (cm) 1.2 Width: (cm) 1.7 Depth: (cm) 0.2  Area: (cm) 1.602 Volume: (cm) 0.32 % Reduction in Area: -30.8% % Reduction in Volume: 86.3% Epithelialization: Small (1-33%) Tunneling: No Undermining: No Wound Description Classification: Grade 2 Wound Margin: Distinct, outline attached Exudate Amount: Medium Exudate Type: Serosanguineous Exudate Color: red, brown Foul Odor After Cleansing: No Slough/Fibrino No Wound Bed Granulation Amount: Medium (34-66%) Exposed Structure Granulation Quality: Red Fascia Exposed: No Necrotic Amount: Medium (34-66%) Fat Layer (Subcutaneous Tissue) Exposed: Yes Necrotic Quality: Adherent Slough Tendon Exposed: No Muscle Exposed: No Joint Exposed: No Bone  Exposed: Yes Electronic Signature(s) Signed: 03/07/2021 4:44:37 PM By: Heidi Dominguez Entered By: Heidi Deed on 03/07/2021 Dominguez:30:06 -------------------------------------------------------------------------------- Vitals Details Patient Name: Date of Service: Heidi NTA GUE, [name] 03/07/2021 10:45 A M Medical Record Number: [id number] Patient Account Number: [id number] Date of Birth/Sex: Treating RN: 01-06-1921 (87 y.o. Heidi Dominguez Primary Care Starasia Sinko: Heidi Dominguez Other Dominguez: Referring Jo-Ann Johanning: Treating Breiona Couvillon/Extender: Heide Scales Dominguez Weeks in Treatment: Dominguez Vital Signs Time Taken: Dominguez:12 Temperature (F): 98.4 Height (in): 64 Pulse (bpm): 98 Source: Stated Respiratory Rate (breaths/min): 18 Weight (lbs): 166 Blood Pressure (mmHg): 152/82 Source: Stated Capillary Blood Glucose (mg/dl): 166 Body Mass Index (BMI): 28.5 Reference Range: 80 - 120 mg / dl Notes glucose per pt report this am Electronic Signature(s) Signed: 03/07/2021 4:44:37 PM By: Heidi Dominguez Signed: 03/07/2021 4:44:37 PM By: Heidi Dominguez Entered By: Heidi Deed on 03/07/2021 Dominguez:13:46

## 2021-03-07 NOTE — Progress Notes (Signed)
Home Health) Every Other Day/15 Days Discharge Instructions: May shower and wash wound with dial antibacterial soap and water prior to dressing change. Peri-Wound Care: Sween Lotion (Moisturizing lotion) (Home Health) Every Other Day/15 Days Discharge Instructions: Apply moisturizing lotion as directed Prim Dressing: FIBRACOL Plus Dressing, 2x2 in (collagen) (Home Health) Every Other Day/15 Days ary Discharge Instructions: Moisten collagen with saline or hydrogel Secondary Dressing: Woven Gauze Sponge, Non-Sterile 4x4 in [facility]) Every Other Day/15 Days Discharge Instructions: Apply over primary dressing as directed. Secondary Dressing: Felt 2.5 yds x 5.5 in [facility]) Every Other Day/15 Days Discharge Instructions: Apply as donut over primary dressing Secured With: Conforming Stretch Gauze Bandage, Sterile 2x75 (in/in) (Home Health) Every Other Day/15 Days Discharge Instructions: Secure with stretch gauze as directed. Secured With: 52M Medipore H Soft Cloth Surgical T 4 x 2 (in/yd) (Home Health) Every Other Day/15 Days ape Discharge Instructions: Secure dressing with tape as directed. 1. Collagen 2. Follow-up in 3 weeks Electronic Signature(s) Signed: 03/07/2021 1:04:00 PM By: Kalman Shan DO Entered By: Kalman Shan on 03/07/2021 13:03:05 -------------------------------------------------------------------------------- HxROS Details Patient Name: Date of Service: [name], [name] 03/07/2021 10:45 A M Medical Record Number: [id number] Patient Account Number: [id number] Date of Birth/Sex: Treating RN: Dec 12, 1920 (87 y.o. Heidi Dominguez Primary Care Provider: Lajean Manes T Other Clinician: Referring Provider: Treating Provider/Extender: [name] Weeks in Treatment: 11 Information Obtained From Patient Eyes Medical History: Past Medical History Notes: Degenerative Eye Disease Respiratory Medical History: Past Medical History Notes: Uses inhaler Cardiovascular Medical History: Positive for: Congestive Heart Failure; Hypertension; Peripheral Arterial Disease; Phlebitis Gastrointestinal Medical History: Past Medical History Notes: Hiatal Hernia Endocrine Medical History: Positive for: Type II Diabetes Time with diabetes: 30+ years Treated with: Oral agents Blood sugar tested every day: Yes Tested : QOD Genitourinary Medical History: Past Medical History Notes: CKD stage 3-Incontinence Musculoskeletal Medical History: Positive for: Osteoarthritis Immunizations Pneumococcal Vaccine: Received Pneumococcal Vaccination: Yes Received Pneumococcal Vaccination On or After 60th Birthday: Yes Implantable Devices None Family and Social History Cancer: Yes - Siblings; Diabetes: Yes - Mother,Maternal Grandparents; Heart Disease: Yes - Mother; Hereditary Spherocytosis: No; Hypertension: Yes - Mother; Kidney Disease: No; Lung Disease: No; Seizures: No; Stroke: Yes - Siblings; Thyroid Problems: No; Tuberculosis: No; Never smoker; Marital Status - Widowed; Alcohol Use: Never; Drug Use: No History; Caffeine Use: Daily; Financial Concerns: No; Food, Clothing or Shelter Needs: No; Support System Lacking: No; Transportation Concerns: No Electronic  Signature(s) Signed: 03/07/2021 1:04:00 PM By: Kalman Shan DO Signed: 03/07/2021 3:40:56 PM By: Lorrin Jackson Entered By: Kalman Shan on 03/07/2021 12:58:18 -------------------------------------------------------------------------------- SuperBill Details Patient Name: Date of Service: [name], [name] 03/07/2021 Medical Record Number: [id number] Patient Account Number: [id number] Date of Birth/Sex: Treating RN: August 10, 1921 (87 y.o. Heidi Dominguez Primary Care Provider: Lajean Manes T Other Clinician: Referring Provider: Treating Provider/Extender: [name] in Treatment: 11 Diagnosis Coding ICD-10 Codes Code Description [phone] Non-pressure chronic ulcer of other part of left foot with unspecified severity M86.9 Osteomyelitis, unspecified E11.621 Type 2 diabetes mellitus with foot ulcer I10 Essential (primary) hypertension [id number] Chronic diastolic (congestive) heart failure Facility Procedures CPT4 Code: [id number] Description: 213 - WOUND CARE VISIT-LEV 3 EST PT Modifier: 1 Quantity: Physician Procedures : CPT4 Code Description Modifier [id number] - WC PHYS LEVEL 3 - EST PT ICD-10 Diagnosis Description L97.529 Non-pressure chronic ulcer of other part of left foot with unspecified severity M86.9 Osteomyelitis, unspecified E11.621 Type 2  Home Health) Every Other Day/15 Days Discharge Instructions: May shower and wash wound with dial antibacterial soap and water prior to dressing change. Peri-Wound Care: Sween Lotion (Moisturizing lotion) (Home Health) Every Other Day/15 Days Discharge Instructions: Apply moisturizing lotion as directed Prim Dressing: FIBRACOL Plus Dressing, 2x2 in (collagen) (Home Health) Every Other Day/15 Days ary Discharge Instructions: Moisten collagen with saline or hydrogel Secondary Dressing: Woven Gauze Sponge, Non-Sterile 4x4 in [facility]) Every Other Day/15 Days Discharge Instructions: Apply over primary dressing as directed. Secondary Dressing: Felt 2.5 yds x 5.5 in [facility]) Every Other Day/15 Days Discharge Instructions: Apply as donut over primary dressing Secured With: Conforming Stretch Gauze Bandage, Sterile 2x75 (in/in) (Home Health) Every Other Day/15 Days Discharge Instructions: Secure with stretch gauze as directed. Secured With: 52M Medipore H Soft Cloth Surgical T 4 x 2 (in/yd) (Home Health) Every Other Day/15 Days ape Discharge Instructions: Secure dressing with tape as directed. 1. Collagen 2. Follow-up in 3 weeks Electronic Signature(s) Signed: 03/07/2021 1:04:00 PM By: Kalman Shan DO Entered By: Kalman Shan on 03/07/2021 13:03:05 -------------------------------------------------------------------------------- HxROS Details Patient Name: Date of Service: [name], [name] 03/07/2021 10:45 A M Medical Record Number: [id number] Patient Account Number: [id number] Date of Birth/Sex: Treating RN: Dec 12, 1920 (87 y.o. Heidi Dominguez Primary Care Provider: Lajean Manes T Other Clinician: Referring Provider: Treating Provider/Extender: [name] Weeks in Treatment: 11 Information Obtained From Patient Eyes Medical History: Past Medical History Notes: Degenerative Eye Disease Respiratory Medical History: Past Medical History Notes: Uses inhaler Cardiovascular Medical History: Positive for: Congestive Heart Failure; Hypertension; Peripheral Arterial Disease; Phlebitis Gastrointestinal Medical History: Past Medical History Notes: Hiatal Hernia Endocrine Medical History: Positive for: Type II Diabetes Time with diabetes: 30+ years Treated with: Oral agents Blood sugar tested every day: Yes Tested : QOD Genitourinary Medical History: Past Medical History Notes: CKD stage 3-Incontinence Musculoskeletal Medical History: Positive for: Osteoarthritis Immunizations Pneumococcal Vaccine: Received Pneumococcal Vaccination: Yes Received Pneumococcal Vaccination On or After 60th Birthday: Yes Implantable Devices None Family and Social History Cancer: Yes - Siblings; Diabetes: Yes - Mother,Maternal Grandparents; Heart Disease: Yes - Mother; Hereditary Spherocytosis: No; Hypertension: Yes - Mother; Kidney Disease: No; Lung Disease: No; Seizures: No; Stroke: Yes - Siblings; Thyroid Problems: No; Tuberculosis: No; Never smoker; Marital Status - Widowed; Alcohol Use: Never; Drug Use: No History; Caffeine Use: Daily; Financial Concerns: No; Food, Clothing or Shelter Needs: No; Support System Lacking: No; Transportation Concerns: No Electronic  Signature(s) Signed: 03/07/2021 1:04:00 PM By: Kalman Shan DO Signed: 03/07/2021 3:40:56 PM By: Lorrin Jackson Entered By: Kalman Shan on 03/07/2021 12:58:18 -------------------------------------------------------------------------------- SuperBill Details Patient Name: Date of Service: [name], [name] 03/07/2021 Medical Record Number: [id number] Patient Account Number: [id number] Date of Birth/Sex: Treating RN: August 10, 1921 (87 y.o. Heidi Dominguez Primary Care Provider: Lajean Manes T Other Clinician: Referring Provider: Treating Provider/Extender: [name] in Treatment: 11 Diagnosis Coding ICD-10 Codes Code Description [phone] Non-pressure chronic ulcer of other part of left foot with unspecified severity M86.9 Osteomyelitis, unspecified E11.621 Type 2 diabetes mellitus with foot ulcer I10 Essential (primary) hypertension [id number] Chronic diastolic (congestive) heart failure Facility Procedures CPT4 Code: [id number] Description: 213 - WOUND CARE VISIT-LEV 3 EST PT Modifier: 1 Quantity: Physician Procedures : CPT4 Code Description Modifier [id number] - WC PHYS LEVEL 3 - EST PT ICD-10 Diagnosis Description L97.529 Non-pressure chronic ulcer of other part of left foot with unspecified severity M86.9 Osteomyelitis, unspecified E11.621 Type 2  Barbarajean Kinzler Stoneking, Hal T Weeks in Treatment: 11 Constitutional respirations regular, non-labored and within target range for patient.. Cardiovascular 2+ dorsalis pedis/posterior tibialis pulses. Psychiatric pleasant and cooperative. Notes Left great toe: Open wound that does not probe to bone. Granulation tissue present. Epithelialization present. No signs of infection Electronic Signature(s) Signed: 03/07/2021 1:04:00 PM By: Kalman Shan DO Entered By: Kalman Shan on 03/07/2021 12:59:44 -------------------------------------------------------------------------------- Physician Orders Details Patient Name: Date of Service: [name], [name] 03/07/2021 10:45 A M Medical Record Number: [id number] Patient Account Number: [id number] Date of Birth/Sex: Treating RN: 1921-05-22 (87 y.o. Heidi Dominguez Primary Care Provider: Lajean Manes T Other Clinician: Referring Provider: Treating Provider/Extender: [name] Weeks in Treatment: 11 Verbal / Phone Orders: No Diagnosis Coding ICD-10 Coding Code Description L97.529 Non-pressure chronic ulcer of other part of left foot with unspecified severity M86.9 Osteomyelitis, unspecified E11.621 Type 2 diabetes mellitus with foot ulcer I10 Essential (primary) hypertension [id number] Chronic diastolic (congestive) heart failure Follow-up Appointments Return appointment in 3 weeks. - On a Friday with Dr. Heber Manilla Bathing/ Shower/ Hygiene May shower and wash wound with soap and water. Edema Control - Lymphedema / SCD / Other Elevate legs to the level of the heart or above for 30 minutes daily and/or when sitting, a frequency of: - throughout the day Moisturize legs daily. Off-Loading Other: - Shoes on only when up walking, do not walk around bare foot Additional Orders / Instructions Follow  Nutritious Diet - 100-120g of Protein Non Wound Condition Protect area with: - Right 5th toe with foam padding as needed for protection. Home Health New wound care orders this week; continue Home Health for wound care. May utilize formulary equivalent dressing for wound treatment orders unless otherwise specified. - Apply lotion to periwound Dressing changes to be completed by Home Health on Monday / Wednesday / Friday except when patient has scheduled visit at [facility]. Other Home Health Orders/Instructions: - [facility] Wound Treatment Wound #1 - T Great oe Wound Laterality: Left Cleanser: Normal Saline (Home Health) Every Other Day/15 Days Discharge Instructions: Cleanse the wound with Normal Saline or wound cleanser prior to applying a clean dressing using gauze sponges, not tissue or cotton balls. Cleanser: Soap and Water [facility]) Every Other Day/15 Days Discharge Instructions: May shower and wash wound with dial antibacterial soap and water prior to dressing change. Peri-Wound Care: Sween Lotion (Moisturizing lotion) (Home Health) Every Other Day/15 Days Discharge Instructions: Apply moisturizing lotion as directed Prim Dressing: FIBRACOL Plus Dressing, 2x2 in (collagen) (Home Health) Every Other Day/15 Days ary Discharge Instructions: Moisten collagen with saline or hydrogel Secondary Dressing: Woven Gauze Sponge, Non-Sterile 4x4 in [facility]) Every Other Day/15 Days Discharge Instructions: Apply over primary dressing as directed. Secondary Dressing: Felt 2.5 yds x 5.5 in [facility]) Every Other Day/15 Days Discharge Instructions: Apply as donut over primary dressing Secured With: Conforming Stretch Gauze Bandage, Sterile 2x75 (in/in) (Home Health) Every Other Day/15 Days Discharge Instructions: Secure with stretch gauze as directed. Secured With: 16M Medipore H Soft Cloth Surgical T 4 x 2 (in/yd) (Home Health) Every Other Day/15 Days ape Discharge Instructions:  Secure dressing with tape as directed. Electronic Signature(s) Signed: 03/07/2021 2:02:00 PM By: Kalman Shan DO Signed: 03/07/2021 3:40:56 PM By: Lorrin Jackson Previous Signature: 03/07/2021 1:04:00 PM Version By: Kalman Shan DO Entered By: Lorrin Jackson on 03/07/2021 13:06:25 -------------------------------------------------------------------------------- Problem List Details Patient Name: Date of Service: [name], [name] 03/07/2021 10:45 A M  Medical Record Number: [id number] Patient Account Number: [id number] Date of Birth/Sex: Treating RN: 1920/08/22 (87 y.o. Heidi Dominguez Primary Care Provider: Lajean Manes T Other Clinician: Referring Provider: Treating Provider/Extender: [name] Weeks in Treatment: 11 Active Problems ICD-10 Encounter Code Description Active Date MDM Diagnosis L97.529 Non-pressure chronic ulcer of other part of left foot with unspecified severity 12/16/2020 No Yes M86.9 Osteomyelitis, unspecified 12/31/2020 No Yes E11.621 Type 2 diabetes mellitus with foot ulcer 12/16/2020 No Yes I10 Essential (primary) hypertension 12/16/2020 No Yes [id number] Chronic diastolic (congestive) heart failure 12/16/2020 No Yes Inactive Problems Resolved Problems Electronic Signature(s) Signed: 03/07/2021 1:04:00 PM By: Kalman Shan DO Previous Signature: 03/07/2021 11:01:04 AM Version By: Lorrin Jackson Entered By: Kalman Shan on 03/07/2021 12:55:11 -------------------------------------------------------------------------------- Progress Note Details Patient Name: Date of Service: [name], [name] 03/07/2021 10:45 A M Medical Record Number: [id number] Patient Account Number: [id number] Date of Birth/Sex: Treating RN: Nov 02, 1920 (87 y.o. Heidi Dominguez Primary Care Provider: Lajean Manes T Other Clinician: Referring Provider: Treating Provider/Extender: [name] Weeks in Treatment:  11 Subjective Chief Complaint Information obtained from Patient Left great toe wound History of Present Illness (HPI) Admission 5/5 Ms. Leonardis is a 87 year old female with a past medical history of type 2 diabetes on oral agents, essential hypertension, chronic diastolic congestive heart failure and CKD 3 that presents to our office for a diabetic foot wound located to the left first great toe. Patient is accompanied by daughter and caregiver who helps provide the history. The wound started 1 year ago and has not shown signs of improvement. In fact they state that for the past month it has gotten worse. She has been following with podiatry for this issue. She does not report having an image done of this foot. She states that recently she was placed on doxycycline because a home health nurse was concerned about an infection. She is using collagen every other day to the wound site. She currently denies any fever/chills, purulent drainage, increased warmth or erythema to the foot. 5/9; patient presents for 1 week follow-up. She states she feels well. She has been using collagen for dressing changes with no issues. She denies any acute signs of infection. She denies fever/chills, nausea/vomiting or drainage From the wound. She is scheduled for an MRI on 5/18 5/20; patient presents for 1 week follow-up. She has been using collagen dressing changes with no issues. She had the MRI of her foot done on 5/18. She denies any signs of local or systemic infection. 6/3; patient presents for 2-week follow-up. She saw infectious disease and was started on oral antibiotics. She currently denies signs of infection and overall feels well. She has been using collagen to the wound. She has no issues or complaints today. 6/22; patient presents for 2-week follow-up. She reports using collagen to the wound. She denies signs of infection. She has no issues or complaints today. 7/5; patient presents for 2-week  follow-up. She reports using collagen to the wound. She states that she will be started on IV antibiotics by infectious disease. She denies signs of infection and she has no issues or complaints today. 7/25; patient presents for 2-week follow-up. She continues to use collagen to the wound bed. She is still on IV antibiotics and tolerating this well. She recently saw infectious disease. She denies signs of infection. Patient History Information obtained from Patient. Family History Cancer - Siblings, Diabetes - Mother,Maternal Grandparents, Heart Disease - Mother, Hypertension - Mother,  Barbarajean Kinzler Stoneking, Hal T Weeks in Treatment: 11 Constitutional respirations regular, non-labored and within target range for patient.. Cardiovascular 2+ dorsalis pedis/posterior tibialis pulses. Psychiatric pleasant and cooperative. Notes Left great toe: Open wound that does not probe to bone. Granulation tissue present. Epithelialization present. No signs of infection Electronic Signature(s) Signed: 03/07/2021 1:04:00 PM By: Kalman Shan DO Entered By: Kalman Shan on 03/07/2021 12:59:44 -------------------------------------------------------------------------------- Physician Orders Details Patient Name: Date of Service: [name], [name] 03/07/2021 10:45 A M Medical Record Number: [id number] Patient Account Number: [id number] Date of Birth/Sex: Treating RN: 1921-05-22 (87 y.o. Heidi Dominguez Primary Care Provider: Lajean Manes T Other Clinician: Referring Provider: Treating Provider/Extender: [name] Weeks in Treatment: 11 Verbal / Phone Orders: No Diagnosis Coding ICD-10 Coding Code Description L97.529 Non-pressure chronic ulcer of other part of left foot with unspecified severity M86.9 Osteomyelitis, unspecified E11.621 Type 2 diabetes mellitus with foot ulcer I10 Essential (primary) hypertension [id number] Chronic diastolic (congestive) heart failure Follow-up Appointments Return appointment in 3 weeks. - On a Friday with Dr. Heber Manilla Bathing/ Shower/ Hygiene May shower and wash wound with soap and water. Edema Control - Lymphedema / SCD / Other Elevate legs to the level of the heart or above for 30 minutes daily and/or when sitting, a frequency of: - throughout the day Moisturize legs daily. Off-Loading Other: - Shoes on only when up walking, do not walk around bare foot Additional Orders / Instructions Follow  Nutritious Diet - 100-120g of Protein Non Wound Condition Protect area with: - Right 5th toe with foam padding as needed for protection. Home Health New wound care orders this week; continue Home Health for wound care. May utilize formulary equivalent dressing for wound treatment orders unless otherwise specified. - Apply lotion to periwound Dressing changes to be completed by Home Health on Monday / Wednesday / Friday except when patient has scheduled visit at [facility]. Other Home Health Orders/Instructions: - [facility] Wound Treatment Wound #1 - T Great oe Wound Laterality: Left Cleanser: Normal Saline (Home Health) Every Other Day/15 Days Discharge Instructions: Cleanse the wound with Normal Saline or wound cleanser prior to applying a clean dressing using gauze sponges, not tissue or cotton balls. Cleanser: Soap and Water [facility]) Every Other Day/15 Days Discharge Instructions: May shower and wash wound with dial antibacterial soap and water prior to dressing change. Peri-Wound Care: Sween Lotion (Moisturizing lotion) (Home Health) Every Other Day/15 Days Discharge Instructions: Apply moisturizing lotion as directed Prim Dressing: FIBRACOL Plus Dressing, 2x2 in (collagen) (Home Health) Every Other Day/15 Days ary Discharge Instructions: Moisten collagen with saline or hydrogel Secondary Dressing: Woven Gauze Sponge, Non-Sterile 4x4 in [facility]) Every Other Day/15 Days Discharge Instructions: Apply over primary dressing as directed. Secondary Dressing: Felt 2.5 yds x 5.5 in [facility]) Every Other Day/15 Days Discharge Instructions: Apply as donut over primary dressing Secured With: Conforming Stretch Gauze Bandage, Sterile 2x75 (in/in) (Home Health) Every Other Day/15 Days Discharge Instructions: Secure with stretch gauze as directed. Secured With: 16M Medipore H Soft Cloth Surgical T 4 x 2 (in/yd) (Home Health) Every Other Day/15 Days ape Discharge Instructions:  Secure dressing with tape as directed. Electronic Signature(s) Signed: 03/07/2021 2:02:00 PM By: Kalman Shan DO Signed: 03/07/2021 3:40:56 PM By: Lorrin Jackson Previous Signature: 03/07/2021 1:04:00 PM Version By: Kalman Shan DO Entered By: Lorrin Jackson on 03/07/2021 13:06:25 -------------------------------------------------------------------------------- Problem List Details Patient Name: Date of Service: [name], [name] 03/07/2021 10:45 A M  Medical Record Number: [id number] Patient Account Number: [id number] Date of Birth/Sex: Treating RN: 1920/08/22 (87 y.o. Heidi Dominguez Primary Care Provider: Lajean Manes T Other Clinician: Referring Provider: Treating Provider/Extender: [name] Weeks in Treatment: 11 Active Problems ICD-10 Encounter Code Description Active Date MDM Diagnosis L97.529 Non-pressure chronic ulcer of other part of left foot with unspecified severity 12/16/2020 No Yes M86.9 Osteomyelitis, unspecified 12/31/2020 No Yes E11.621 Type 2 diabetes mellitus with foot ulcer 12/16/2020 No Yes I10 Essential (primary) hypertension 12/16/2020 No Yes [id number] Chronic diastolic (congestive) heart failure 12/16/2020 No Yes Inactive Problems Resolved Problems Electronic Signature(s) Signed: 03/07/2021 1:04:00 PM By: Kalman Shan DO Previous Signature: 03/07/2021 11:01:04 AM Version By: Lorrin Jackson Entered By: Kalman Shan on 03/07/2021 12:55:11 -------------------------------------------------------------------------------- Progress Note Details Patient Name: Date of Service: [name], [name] 03/07/2021 10:45 A M Medical Record Number: [id number] Patient Account Number: [id number] Date of Birth/Sex: Treating RN: Nov 02, 1920 (87 y.o. Heidi Dominguez Primary Care Provider: Lajean Manes T Other Clinician: Referring Provider: Treating Provider/Extender: [name] Weeks in Treatment:  11 Subjective Chief Complaint Information obtained from Patient Left great toe wound History of Present Illness (HPI) Admission 5/5 Ms. Leonardis is a 87 year old female with a past medical history of type 2 diabetes on oral agents, essential hypertension, chronic diastolic congestive heart failure and CKD 3 that presents to our office for a diabetic foot wound located to the left first great toe. Patient is accompanied by daughter and caregiver who helps provide the history. The wound started 1 year ago and has not shown signs of improvement. In fact they state that for the past month it has gotten worse. She has been following with podiatry for this issue. She does not report having an image done of this foot. She states that recently she was placed on doxycycline because a home health nurse was concerned about an infection. She is using collagen every other day to the wound site. She currently denies any fever/chills, purulent drainage, increased warmth or erythema to the foot. 5/9; patient presents for 1 week follow-up. She states she feels well. She has been using collagen for dressing changes with no issues. She denies any acute signs of infection. She denies fever/chills, nausea/vomiting or drainage From the wound. She is scheduled for an MRI on 5/18 5/20; patient presents for 1 week follow-up. She has been using collagen dressing changes with no issues. She had the MRI of her foot done on 5/18. She denies any signs of local or systemic infection. 6/3; patient presents for 2-week follow-up. She saw infectious disease and was started on oral antibiotics. She currently denies signs of infection and overall feels well. She has been using collagen to the wound. She has no issues or complaints today. 6/22; patient presents for 2-week follow-up. She reports using collagen to the wound. She denies signs of infection. She has no issues or complaints today. 7/5; patient presents for 2-week  follow-up. She reports using collagen to the wound. She states that she will be started on IV antibiotics by infectious disease. She denies signs of infection and she has no issues or complaints today. 7/25; patient presents for 2-week follow-up. She continues to use collagen to the wound bed. She is still on IV antibiotics and tolerating this well. She recently saw infectious disease. She denies signs of infection. Patient History Information obtained from Patient. Family History Cancer - Siblings, Diabetes - Mother,Maternal Grandparents, Heart Disease - Mother, Hypertension - Mother,

## 2021-03-08 DIAGNOSIS — E113293 Type 2 diabetes mellitus with mild nonproliferative diabetic retinopathy without macular edema, bilateral: Secondary | ICD-10-CM | POA: Diagnosis not present

## 2021-03-08 DIAGNOSIS — I110 Hypertensive heart disease with heart failure: Secondary | ICD-10-CM | POA: Diagnosis not present

## 2021-03-08 DIAGNOSIS — L97529 Non-pressure chronic ulcer of other part of left foot with unspecified severity: Secondary | ICD-10-CM | POA: Diagnosis not present

## 2021-03-08 DIAGNOSIS — H353211 Exudative age-related macular degeneration, right eye, with active choroidal neovascularization: Secondary | ICD-10-CM | POA: Diagnosis not present

## 2021-03-08 DIAGNOSIS — E11621 Type 2 diabetes mellitus with foot ulcer: Secondary | ICD-10-CM | POA: Diagnosis not present

## 2021-03-08 DIAGNOSIS — M86172 Other acute osteomyelitis, left ankle and foot: Secondary | ICD-10-CM | POA: Diagnosis not present

## 2021-03-08 DIAGNOSIS — I5032 Chronic diastolic (congestive) heart failure: Secondary | ICD-10-CM | POA: Diagnosis not present

## 2021-03-09 ENCOUNTER — Telehealth: Payer: Self-pay | Admitting: *Deleted

## 2021-03-09 DIAGNOSIS — E113293 Type 2 diabetes mellitus with mild nonproliferative diabetic retinopathy without macular edema, bilateral: Secondary | ICD-10-CM | POA: Diagnosis not present

## 2021-03-09 DIAGNOSIS — I5032 Chronic diastolic (congestive) heart failure: Secondary | ICD-10-CM | POA: Diagnosis not present

## 2021-03-09 DIAGNOSIS — M86172 Other acute osteomyelitis, left ankle and foot: Secondary | ICD-10-CM | POA: Diagnosis not present

## 2021-03-09 DIAGNOSIS — L97529 Non-pressure chronic ulcer of other part of left foot with unspecified severity: Secondary | ICD-10-CM | POA: Diagnosis not present

## 2021-03-09 DIAGNOSIS — E11621 Type 2 diabetes mellitus with foot ulcer: Secondary | ICD-10-CM | POA: Diagnosis not present

## 2021-03-09 DIAGNOSIS — I110 Hypertensive heart disease with heart failure: Secondary | ICD-10-CM | POA: Diagnosis not present

## 2021-03-09 NOTE — Telephone Encounter (Signed)
Linus Orn w/ [id number] [phone]) is calling to request an order for an extra day to go out to draw labs for patient. They were unable to get yesterday or today. Please advise.

## 2021-03-10 ENCOUNTER — Telehealth: Payer: Self-pay

## 2021-03-10 DIAGNOSIS — L97529 Non-pressure chronic ulcer of other part of left foot with unspecified severity: Secondary | ICD-10-CM | POA: Diagnosis not present

## 2021-03-10 DIAGNOSIS — E113293 Type 2 diabetes mellitus with mild nonproliferative diabetic retinopathy without macular edema, bilateral: Secondary | ICD-10-CM | POA: Diagnosis not present

## 2021-03-10 DIAGNOSIS — I110 Hypertensive heart disease with heart failure: Secondary | ICD-10-CM | POA: Diagnosis not present

## 2021-03-10 DIAGNOSIS — M86172 Other acute osteomyelitis, left ankle and foot: Secondary | ICD-10-CM | POA: Diagnosis not present

## 2021-03-10 DIAGNOSIS — E11621 Type 2 diabetes mellitus with foot ulcer: Secondary | ICD-10-CM | POA: Diagnosis not present

## 2021-03-10 DIAGNOSIS — I5032 Chronic diastolic (congestive) heart failure: Secondary | ICD-10-CM | POA: Diagnosis not present

## 2021-03-10 NOTE — Telephone Encounter (Signed)
Returned call to [employer], gave verbal orders per Dr  Cannon Kettle , one more day to have labs drawn.

## 2021-03-10 NOTE — Telephone Encounter (Signed)
Daughter reports that patient is having vaginal discharge but no itching. Discharge is white in color. Has taken antibiotic pill for this in the past for 5-7 days.  Would like medication to help but is already on multiple antibiotics. Please send any RX you recommend to the [facility] in [place].  [phone]

## 2021-03-11 ENCOUNTER — Telehealth: Payer: Self-pay | Admitting: *Deleted

## 2021-03-11 ENCOUNTER — Telehealth: Payer: Self-pay

## 2021-03-11 DIAGNOSIS — Z03818 Encounter for observation for suspected exposure to other biological agents ruled out: Secondary | ICD-10-CM | POA: Diagnosis not present

## 2021-03-11 DIAGNOSIS — B373 Candidiasis of vulva and vagina: Secondary | ICD-10-CM

## 2021-03-11 DIAGNOSIS — Z20822 Contact with and (suspected) exposure to covid-19: Secondary | ICD-10-CM | POA: Diagnosis not present

## 2021-03-11 DIAGNOSIS — B3731 Acute candidiasis of vulva and vagina: Secondary | ICD-10-CM

## 2021-03-11 MED ORDER — FLUCONAZOLE 150 MG PO TABS: ORAL_TABLET | ORAL | 0 refills | Status: DC

## 2021-03-11 NOTE — Telephone Encounter (Signed)
Patient came into clinic per her home health nurse's request for evaluation of PICC line. She was here for an appointment with PCP Dr. Felipa Eth upstairs.   Daughter [name] reports seeing clear, pink-tinged fluid dripping out "like a badly dripping sink" when she started pushing saline this morning. She stopped flushing, did not administer IV antibiotics. Patient's dressing is saturated. Homehealth RN placed gauze under PICC dressing, but this is now saturated as well. Insertion site is approximately 0.75 cm wide. Skin is wet under dressing, but intact without maceration.  RN spoke with Dr Gale Journey for advice. He gave verbal orders to pull PICC and have patient transition back to oral doxycycline/metronidazole that she was taking before PICC was placed. He would also have her stop Cipro. Heidi Dominguez is also reporting copious amounts of heavy, thick white vaginal discharge. Dr Gale Journey gave verbal order for diflucan 150 mg by mouth once, may repeat in 3 days if symptoms persist. RN sent electronic order to [facility] per Heidi Dominguez. Patient will follow up Tuesday 8/2 11:00 with Dr Juleen China.   Per verbal order from Dr Gale Journey, 16 cm Single Lumen Peripherally Inserted Catheter removed from left arm, tip intact. No sutures present. RN confirmed length per chart. Dressing was clean, 2x2 folded gauze placed at bottom of dressing saturated with clear and colorless fluid. Dressing peeling off over saturated gauze. RN held pressure and placed Petroleum dressing. Pt advised no heavy lifting with this arm, leave dressing for 24 hours and call the office or seek emergent care if dressing becomes soaked with blood, swelling, or sharp pain presents. Patient and daughter verbalized understanding and agreement.  Patient's questions answered to their satisfaction. Patient tolerated procedure well, RN walked patient to check out. Pharmacy notified. Heidi Gandy, RN

## 2021-03-11 NOTE — Telephone Encounter (Signed)
Elizabeth with [facility] called back stating she did a facetime fall with the patient's daughter today and the patient's picc is leaking more than it was yesterday which is concerning to her. She also notice a horizontal red line across the picc insertion. Patient denies any pain in her arm at this time

## 2021-03-11 NOTE — Telephone Encounter (Signed)
Elizabeth with [facility] called back to let our office know that patient has been approved for cath flo. Cath Flo will be sent out today, but nursing will not be able to administer it until Monday. They will need orders signed with dosing for Cath Flo and protocols  Colletta Maryland do you mind signing orders?

## 2021-03-11 NOTE — Telephone Encounter (Signed)
Order has been faxed over 

## 2021-03-11 NOTE — Telephone Encounter (Signed)
Patient walked in to the office today wanting the picc evaluated. Patient is having picc pulled today in office per Dr. Gale Journey.  Heidi Dominguez

## 2021-03-11 NOTE — Telephone Encounter (Signed)
I spoke with Benjamine Mola from [facility] and she states she saw the patient last night fo evaluate her picc line. Benjamine Mola stated the patient's picc line has a slight leak from the insertion site, but she does not feel it is a concern and picc line is functioning fine. She stated she has spoke with [facility] to see if they can get cath flo approved through the patient's insurance because they currently not able to get blood return from picc and unable to get blood by peripheral stick. Benjamine Mola will be following up with patient's daughter after she hears back from [facility].   I will also reach out to the patient's daughter to let her know the patient's picc line is fine to continue using.  Malie Kashani Tilda Burrow '

## 2021-03-12 DIAGNOSIS — L97529 Non-pressure chronic ulcer of other part of left foot with unspecified severity: Secondary | ICD-10-CM | POA: Diagnosis not present

## 2021-03-12 DIAGNOSIS — I110 Hypertensive heart disease with heart failure: Secondary | ICD-10-CM | POA: Diagnosis not present

## 2021-03-12 DIAGNOSIS — I5032 Chronic diastolic (congestive) heart failure: Secondary | ICD-10-CM | POA: Diagnosis not present

## 2021-03-12 DIAGNOSIS — E11621 Type 2 diabetes mellitus with foot ulcer: Secondary | ICD-10-CM | POA: Diagnosis not present

## 2021-03-12 DIAGNOSIS — E113293 Type 2 diabetes mellitus with mild nonproliferative diabetic retinopathy without macular edema, bilateral: Secondary | ICD-10-CM | POA: Diagnosis not present

## 2021-03-12 DIAGNOSIS — M86172 Other acute osteomyelitis, left ankle and foot: Secondary | ICD-10-CM | POA: Diagnosis not present

## 2021-03-14 ENCOUNTER — Other Ambulatory Visit: Payer: Self-pay

## 2021-03-14 ENCOUNTER — Encounter (HOSPITAL_COMMUNITY): Payer: Self-pay

## 2021-03-14 ENCOUNTER — Inpatient Hospital Stay (HOSPITAL_COMMUNITY)
Admission: EM | Admit: 2021-03-14 | Discharge: 2021-03-23 | DRG: 177 | Disposition: A | Discharge status: Skilled Nursing Facility | Payer: Medicare Other | Attending: Family Medicine | Admitting: Family Medicine

## 2021-03-14 ENCOUNTER — Emergency Department (HOSPITAL_COMMUNITY): Payer: Medicare Other

## 2021-03-14 DIAGNOSIS — D72819 Decreased white blood cell count, unspecified: Secondary | ICD-10-CM | POA: Diagnosis present

## 2021-03-14 DIAGNOSIS — N3281 Overactive bladder: Secondary | ICD-10-CM | POA: Diagnosis present

## 2021-03-14 DIAGNOSIS — D696 Thrombocytopenia, unspecified: Secondary | ICD-10-CM | POA: Diagnosis present

## 2021-03-14 DIAGNOSIS — H04129 Dry eye syndrome of unspecified lacrimal gland: Secondary | ICD-10-CM | POA: Diagnosis present

## 2021-03-14 DIAGNOSIS — Z882 Allergy status to sulfonamides status: Secondary | ICD-10-CM

## 2021-03-14 DIAGNOSIS — I130 Hypertensive heart and chronic kidney disease with heart failure and stage 1 through stage 4 chronic kidney disease, or unspecified chronic kidney disease: Secondary | ICD-10-CM | POA: Diagnosis not present

## 2021-03-14 DIAGNOSIS — U071 COVID-19: Principal | ICD-10-CM | POA: Diagnosis present

## 2021-03-14 DIAGNOSIS — T380X5A Adverse effect of glucocorticoids and synthetic analogues, initial encounter: Secondary | ICD-10-CM | POA: Diagnosis not present

## 2021-03-14 DIAGNOSIS — R5381 Other malaise: Secondary | ICD-10-CM

## 2021-03-14 DIAGNOSIS — L97529 Non-pressure chronic ulcer of other part of left foot with unspecified severity: Secondary | ICD-10-CM | POA: Diagnosis present

## 2021-03-14 DIAGNOSIS — E871 Hypo-osmolality and hyponatremia: Secondary | ICD-10-CM | POA: Diagnosis not present

## 2021-03-14 DIAGNOSIS — I959 Hypotension, unspecified: Secondary | ICD-10-CM | POA: Diagnosis not present

## 2021-03-14 DIAGNOSIS — J9601 Acute respiratory failure with hypoxia: Secondary | ICD-10-CM | POA: Diagnosis not present

## 2021-03-14 DIAGNOSIS — Z79899 Other long term (current) drug therapy: Secondary | ICD-10-CM

## 2021-03-14 DIAGNOSIS — Z8249 Family history of ischemic heart disease and other diseases of the circulatory system: Secondary | ICD-10-CM

## 2021-03-14 DIAGNOSIS — N1831 Chronic kidney disease, stage 3a: Secondary | ICD-10-CM | POA: Diagnosis present

## 2021-03-14 DIAGNOSIS — E1122 Type 2 diabetes mellitus with diabetic chronic kidney disease: Secondary | ICD-10-CM | POA: Diagnosis present

## 2021-03-14 DIAGNOSIS — M940 Chondrocostal junction syndrome [Tietze]: Secondary | ICD-10-CM | POA: Insufficient documentation

## 2021-03-14 DIAGNOSIS — I5032 Chronic diastolic (congestive) heart failure: Secondary | ICD-10-CM | POA: Diagnosis present

## 2021-03-14 DIAGNOSIS — R0902 Hypoxemia: Secondary | ICD-10-CM | POA: Diagnosis not present

## 2021-03-14 DIAGNOSIS — K859 Acute pancreatitis without necrosis or infection, unspecified: Secondary | ICD-10-CM | POA: Diagnosis not present

## 2021-03-14 DIAGNOSIS — Z885 Allergy status to narcotic agent status: Secondary | ICD-10-CM

## 2021-03-14 DIAGNOSIS — E11621 Type 2 diabetes mellitus with foot ulcer: Secondary | ICD-10-CM | POA: Diagnosis present

## 2021-03-14 DIAGNOSIS — E7800 Pure hypercholesterolemia, unspecified: Secondary | ICD-10-CM | POA: Diagnosis present

## 2021-03-14 DIAGNOSIS — Z794 Long term (current) use of insulin: Secondary | ICD-10-CM

## 2021-03-14 DIAGNOSIS — Z833 Family history of diabetes mellitus: Secondary | ICD-10-CM

## 2021-03-14 DIAGNOSIS — E1169 Type 2 diabetes mellitus with other specified complication: Secondary | ICD-10-CM | POA: Diagnosis present

## 2021-03-14 DIAGNOSIS — R079 Chest pain, unspecified: Secondary | ICD-10-CM | POA: Diagnosis not present

## 2021-03-14 DIAGNOSIS — H9190 Unspecified hearing loss, unspecified ear: Secondary | ICD-10-CM | POA: Diagnosis present

## 2021-03-14 DIAGNOSIS — R0789 Other chest pain: Secondary | ICD-10-CM | POA: Diagnosis not present

## 2021-03-14 DIAGNOSIS — Z880 Allergy status to penicillin: Secondary | ICD-10-CM

## 2021-03-14 DIAGNOSIS — J029 Acute pharyngitis, unspecified: Secondary | ICD-10-CM

## 2021-03-14 DIAGNOSIS — Z7984 Long term (current) use of oral hypoglycemic drugs: Secondary | ICD-10-CM

## 2021-03-14 DIAGNOSIS — E1151 Type 2 diabetes mellitus with diabetic peripheral angiopathy without gangrene: Secondary | ICD-10-CM | POA: Diagnosis present

## 2021-03-14 DIAGNOSIS — Z87440 Personal history of urinary (tract) infections: Secondary | ICD-10-CM

## 2021-03-14 DIAGNOSIS — K449 Diaphragmatic hernia without obstruction or gangrene: Secondary | ICD-10-CM | POA: Diagnosis not present

## 2021-03-14 DIAGNOSIS — G8929 Other chronic pain: Secondary | ICD-10-CM | POA: Diagnosis present

## 2021-03-14 DIAGNOSIS — Z7982 Long term (current) use of aspirin: Secondary | ICD-10-CM

## 2021-03-14 DIAGNOSIS — R059 Cough, unspecified: Secondary | ICD-10-CM

## 2021-03-14 DIAGNOSIS — Z20822 Contact with and (suspected) exposure to covid-19: Secondary | ICD-10-CM

## 2021-03-14 DIAGNOSIS — K219 Gastro-esophageal reflux disease without esophagitis: Secondary | ICD-10-CM | POA: Diagnosis present

## 2021-03-14 DIAGNOSIS — Z7902 Long term (current) use of antithrombotics/antiplatelets: Secondary | ICD-10-CM

## 2021-03-14 DIAGNOSIS — Z888 Allergy status to other drugs, medicaments and biological substances status: Secondary | ICD-10-CM

## 2021-03-14 DIAGNOSIS — E1165 Type 2 diabetes mellitus with hyperglycemia: Secondary | ICD-10-CM | POA: Diagnosis not present

## 2021-03-14 LAB — HEMOGLOBIN A1C
Hgb A1c MFr Bld: 6.9 % — ABNORMAL HIGH (ref 4.8–5.6)
Mean Plasma Glucose: 151.33 mg/dL

## 2021-03-14 LAB — COMPREHENSIVE METABOLIC PANEL
ALT: 15 U/L (ref 0–44)
AST: 28 U/L (ref 15–41)
Albumin: 3 g/dL — ABNORMAL LOW (ref 3.5–5.0)
Alkaline Phosphatase: 58 U/L (ref 38–126)
Anion gap: 8 (ref 5–15)
BUN: 8 mg/dL (ref 8–23)
CO2: 32 mmol/L (ref 22–32)
Calcium: 9.5 mg/dL (ref 8.9–10.3)
Chloride: 90 mmol/L — ABNORMAL LOW (ref 98–111)
Creatinine, Ser: 0.85 mg/dL (ref 0.44–1.00)
GFR, Estimated: >60 mL/min (ref >60)
Glucose, Bld: 209 mg/dL — ABNORMAL HIGH (ref 70–99)
Potassium: 4 mmol/L (ref 3.5–5.1)
Sodium: 130 mmol/L — ABNORMAL LOW (ref 135–145)
Total Bilirubin: 0.6 mg/dL (ref 0.3–1.2)
Total Protein: 6.8 g/dL (ref 6.5–8.1)

## 2021-03-14 LAB — SARS CORONAVIRUS 2 (TAT 6-24 HRS): SARS Coronavirus 2: POSITIVE — AB

## 2021-03-14 LAB — CBC
HCT: 47.1 % — ABNORMAL HIGH (ref 36.0–46.0)
Hemoglobin: 15 g/dL (ref 12.0–15.0)
MCH: 29.1 pg (ref 26.0–34.0)
MCHC: 31.8 g/dL (ref 30.0–36.0)
MCV: 91.3 fL (ref 80.0–100.0)
Platelets: 171 10*3/uL (ref 150–400)
RBC: 5.16 MIL/uL — ABNORMAL HIGH (ref 3.87–5.11)
RDW: 14.8 % (ref 11.5–15.5)
WBC: 6.2 10*3/uL (ref 4.0–10.5)
nRBC: 0 % (ref 0.0–0.2)

## 2021-03-14 LAB — BRAIN NATRIURETIC PEPTIDE: B Natriuretic Peptide: 36.2 pg/mL (ref 0.0–100.0)

## 2021-03-14 LAB — TROPONIN I (HIGH SENSITIVITY)
Troponin I (High Sensitivity): 8 ng/L (ref <18)
Troponin I (High Sensitivity): 9 ng/L (ref <18)

## 2021-03-14 MED ORDER — ALBUTEROL SULFATE (2.5 MG/3ML) 0.083% IN NEBU: 2.5000 mg | INHALATION_SOLUTION | Freq: Four times a day (QID) | RESPIRATORY_TRACT | Status: DC | PRN

## 2021-03-14 MED ORDER — INSULIN ASPART 100 UNIT/ML IJ SOLN
0.0000 [IU] | Freq: Three times a day (TID) | INTRAMUSCULAR | Status: DC
  Administered 2021-03-15: 2 [IU] via SUBCUTANEOUS
  Administered 2021-03-16: 11 [IU] via SUBCUTANEOUS
  Administered 2021-03-17: 2 [IU] via SUBCUTANEOUS
  Administered 2021-03-17 – 2021-03-18 (×2): 3 [IU] via SUBCUTANEOUS
  Administered 2021-03-19: 5 [IU] via SUBCUTANEOUS
  Administered 2021-03-20: 3 [IU] via SUBCUTANEOUS
  Administered 2021-03-20: 2 [IU] via SUBCUTANEOUS
  Administered 2021-03-21 – 2021-03-23 (×6): 3 [IU] via SUBCUTANEOUS

## 2021-03-14 MED ORDER — COLCHICINE 0.6 MG PO TABS: 0.6000 mg | ORAL_TABLET | Freq: Every day | ORAL | Status: DC

## 2021-03-14 MED ORDER — ONDANSETRON HCL 4 MG PO TABS
4.0000 mg | ORAL_TABLET | Freq: Three times a day (TID) | ORAL | Status: DC | PRN
  Administered 2021-03-16: 4 mg via ORAL
  Filled 2021-03-14: qty 1

## 2021-03-14 MED ORDER — PROPYLENE GLYCOL 0.6 % OP SOLN: 2.0000 [drp] | Freq: Two times a day (BID) | OPHTHALMIC | Status: DC

## 2021-03-14 MED ORDER — AMLODIPINE BESYLATE 10 MG PO TABS
10.0000 mg | ORAL_TABLET | Freq: Every day | ORAL | Status: DC
  Administered 2021-03-14 – 2021-03-23 (×10): 10 mg via ORAL
  Filled 2021-03-14: qty 2
  Filled 2021-03-14 (×4): qty 1
  Filled 2021-03-14: qty 2
  Filled 2021-03-14 (×4): qty 1

## 2021-03-14 MED ORDER — FERROUS SULFATE 325 (65 FE) MG PO TABS
325.0000 mg | ORAL_TABLET | Freq: Every day | ORAL | Status: DC
  Administered 2021-03-14 – 2021-03-22 (×9): 325 mg via ORAL
  Filled 2021-03-14 (×9): qty 1

## 2021-03-14 MED ORDER — FUROSEMIDE 20 MG PO TABS
20.0000 mg | ORAL_TABLET | Freq: Every day | ORAL | Status: DC
  Administered 2021-03-14 – 2021-03-23 (×10): 20 mg via ORAL
  Filled 2021-03-14 (×10): qty 1

## 2021-03-14 MED ORDER — CLOPIDOGREL BISULFATE 75 MG PO TABS
75.0000 mg | ORAL_TABLET | Freq: Every day | ORAL | Status: DC
  Administered 2021-03-14 – 2021-03-23 (×10): 75 mg via ORAL
  Filled 2021-03-14 (×10): qty 1

## 2021-03-14 MED ORDER — ALBUTEROL SULFATE HFA 108 (90 BASE) MCG/ACT IN AERS: 2.0000 | INHALATION_SPRAY | Freq: Four times a day (QID) | RESPIRATORY_TRACT | Status: DC | PRN

## 2021-03-14 MED ORDER — GUAIFENESIN ER 600 MG PO TB12
1200.0000 mg | ORAL_TABLET | Freq: Two times a day (BID) | ORAL | Status: DC
  Administered 2021-03-14 – 2021-03-23 (×18): 1200 mg via ORAL
  Filled 2021-03-14 (×17): qty 2

## 2021-03-14 MED ORDER — LIDOCAINE 5 % EX OINT: TOPICAL_OINTMENT | Freq: Every day | CUTANEOUS | Status: DC | PRN

## 2021-03-14 MED ORDER — LIDOCAINE 5 % EX PTCH
1.0000 | MEDICATED_PATCH | CUTANEOUS | Status: DC
  Administered 2021-03-14 – 2021-03-22 (×9): 1 via TRANSDERMAL
  Filled 2021-03-14 (×10): qty 1

## 2021-03-14 MED ORDER — PANTOPRAZOLE SODIUM 40 MG PO TBEC
40.0000 mg | DELAYED_RELEASE_TABLET | Freq: Every day | ORAL | Status: DC
  Administered 2021-03-15 – 2021-03-23 (×9): 40 mg via ORAL
  Filled 2021-03-14 (×9): qty 1

## 2021-03-14 MED ORDER — LINAGLIPTIN 5 MG PO TABS
5.0000 mg | ORAL_TABLET | Freq: Every day | ORAL | Status: DC
  Administered 2021-03-14 – 2021-03-23 (×10): 5 mg via ORAL
  Filled 2021-03-14 (×10): qty 1

## 2021-03-14 MED ORDER — LOSARTAN POTASSIUM 50 MG PO TABS
25.0000 mg | ORAL_TABLET | Freq: Every day | ORAL | Status: DC
  Administered 2021-03-14 – 2021-03-16 (×3): 25 mg via ORAL
  Filled 2021-03-14 (×3): qty 1

## 2021-03-14 MED ORDER — ENOXAPARIN SODIUM 30 MG/0.3ML IJ SOSY
30.0000 mg | PREFILLED_SYRINGE | INTRAMUSCULAR | Status: DC
  Administered 2021-03-14: 30 mg via SUBCUTANEOUS
  Filled 2021-03-14: qty 0.3

## 2021-03-14 MED ORDER — BENZONATATE 100 MG PO CAPS
100.0000 mg | ORAL_CAPSULE | Freq: Three times a day (TID) | ORAL | Status: DC
  Administered 2021-03-14 – 2021-03-23 (×27): 100 mg via ORAL
  Filled 2021-03-14 (×27): qty 1

## 2021-03-14 MED ORDER — POLYVINYL ALCOHOL 1.4 % OP SOLN
1.0000 [drp] | Freq: Every day | OPHTHALMIC | Status: DC | PRN
  Administered 2021-03-16 – 2021-03-22 (×3): 1 [drp] via OPHTHALMIC
  Filled 2021-03-14: qty 15

## 2021-03-14 MED ORDER — ASPIRIN EC 81 MG PO TBEC
81.0000 mg | DELAYED_RELEASE_TABLET | Freq: Every day | ORAL | Status: DC
  Administered 2021-03-14 – 2021-03-23 (×10): 81 mg via ORAL
  Filled 2021-03-14 (×10): qty 1

## 2021-03-14 MED ORDER — LORATADINE 10 MG PO TABS
10.0000 mg | ORAL_TABLET | Freq: Every day | ORAL | Status: DC
  Administered 2021-03-14 – 2021-03-23 (×10): 10 mg via ORAL
  Filled 2021-03-14 (×10): qty 1

## 2021-03-14 MED ORDER — VITAMIN B-12 1000 MCG PO TABS
1000.0000 ug | ORAL_TABLET | ORAL | Status: DC
  Administered 2021-03-20: 1000 ug via ORAL
  Filled 2021-03-14: qty 1

## 2021-03-14 MED ORDER — ACETAMINOPHEN 325 MG PO TABS: 650.0000 mg | ORAL_TABLET | Freq: Three times a day (TID) | ORAL | Status: DC | PRN

## 2021-03-14 MED ORDER — POTASSIUM CHLORIDE CRYS ER 10 MEQ PO TBCR
10.0000 meq | EXTENDED_RELEASE_TABLET | Freq: Every day | ORAL | Status: DC
  Administered 2021-03-15 – 2021-03-16 (×2): 10 meq via ORAL
  Filled 2021-03-14 (×4): qty 1

## 2021-03-14 MED ORDER — FAMOTIDINE 20 MG PO TABS
10.0000 mg | ORAL_TABLET | Freq: Two times a day (BID) | ORAL | Status: DC
  Administered 2021-03-14 – 2021-03-23 (×18): 10 mg via ORAL
  Filled 2021-03-14 (×18): qty 1

## 2021-03-14 NOTE — H&P (Signed)
History and Physical    Heidi Dominguez [id number] DOB: 12-21-1920 DOA: 03/14/2021  PCP: Lajean Manes, MD (Confirm with patient/family/NH records and if not entered, this has to be entered at [facility] point of entry) Patient coming from: Home  I have personally briefly reviewed patient's old medical records in [place]  Chief Complaint: Cough, chest pain  HPI: Heidi Dominguez is a 87 y.o. female with medical history significant of IIDM, chronic diastolic CHF, HTN, CKD stage III, recent left big toe osteomyelitis on IV antibiotics, PVD status post left tibial artery angioplasty and stenting in May 2022, presented with persistent cough, chest pain.  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  Starting Friday, patient started to have dry cough, and ProTrach chest pain on the left side of the chest, worsening with cough and chest movement.  Denies any shortness of breath, no fever chills no loss of taste no nausea vomit or diarrhea.  Patient was recently diagnosed with left big toe osteomyelitis started on antibiotics 3 weeks ago, 4 days ago however, her PICC line had infiltrated and had to come out.  Family contact infection disease, who recommended patient come into the office to discuss whether restart IV antibiotic versus switch to p.o. antibiotics.  Patient denies any pain of the left big toe no fever or chills.  Patient has been having generalized weakness worsening with cough and chest pain.  Daughter reported that this morning, patient feels her legs were both weak to walk.  Denies any numbness weakness of one side of the body  ED Course: Chest x-ray clear, troponin negative x1, EKG negative for ST changes.  COVID test pending.  Review of Systems: As per HPI otherwise 14 point review of systems negative.    Past Medical History:  Diagnosis Date   Acute pancreatitis    hx of    Arthritis    "qwhere" (07/04/2018)   Chronic back pain    "all over"  (07/04/2018)   Chronic diastolic CHF (congestive heart failure) (HCC)    Chronic kidney disease    stage  3 chronic kidney disease    Complication of anesthesia    "I have a hard time waking up"   Degenerative joint disease of shoulder region    Dry eye syndrome    Dyspnea    Family history of adverse reaction to anesthesia    "daughter has the shakes when she wakes up"   GERD (gastroesophageal reflux disease)    Heart murmur    High cholesterol    History of hiatal hernia    HOH (hard of hearing)    Hypertension    Impacted cerumen of both ears    hx of    Muscle weakness (generalized)    Osteoarthritis    Overactive bladder    Pancreatitis    hx of    Phlebitis    "BLE"   Rotator cuff tear    right    Tear of right supraspinatus tendon    Thrombocytopenia (HCC)    hx of    Type II diabetes mellitus (HCC)    Urinary tract infection    hx of    Xerosis cutis    hx of     Past Surgical History:  Procedure Laterality Date   ABDOMINAL AORTOGRAM W/LOWER EXTREMITY N/A 12/22/2020   Procedure: ABDOMINAL AORTOGRAM W/LOWER EXTREMITY;  Surgeon: Cherre Robins, MD;  Location: [facility];  Service: Cardiovascular;  Laterality: N/A;   ABDOMINAL HYSTERECTOMY     BACK SURGERY     CATARACT EXTRACTION W/ INTRAOCULAR LENS  IMPLANT, BILATERAL Bilateral    CHOLECYSTECTOMY  2016   ERCP N/A 08/30/2018   Procedure: ENDOSCOPIC RETROGRADE CHOLANGIOPANCREATOGRAPHY (ERCP);  Surgeon: Clarene Essex, MD;  Location: [facility];  Service: Endoscopy;  Laterality: N/A;   FIXATION KYPHOPLASTY     INCISION AND DRAINAGE Right 11/08/2018   Procedure: INCISION AND DRAINAGE right shoulder, placement of antibiotic beads ;  Surgeon: [name], MD;  Location: [facility];  Service: Orthopedics;  Laterality: Right;   JOINT REPLACEMENT     LAPAROSCOPIC CHOLECYSTECTOMY SINGLE SITE WITH INTRAOPERATIVE CHOLANGIOGRAM N/A 04/11/2015   Procedure: LAPAROSCOPIC LYSIS OF ADHESIONS, LAPAROSCOPIC CHOLECYSTECTOMY  WITH INTRAOPERATIVE CHOLANGIOGRAM;  Surgeon: Michael Boston, MD;  Location: [facility];  Service: General;  Laterality: N/A;   PANCREATIC STENT PLACEMENT  08/30/2018   Procedure: PANCREATIC STENT PLACEMENT;  Surgeon: Clarene Essex, MD;  Location: [facility];  Service: Endoscopy;;   PERIPHERAL VASCULAR INTERVENTION  12/22/2020   Procedure: PERIPHERAL VASCULAR INTERVENTION;  Surgeon: Cherre Robins, MD;  Location: [facility];  Service: Cardiovascular;;  left anterior tibial artery   REMOVAL OF STONES  08/30/2018   Procedure: REMOVAL OF STONES;  Surgeon: Clarene Essex, MD;  Location: [facility];  Service: Endoscopy;;   SHOULDER OPEN ROTATOR CUFF REPAIR Bilateral    SPHINCTEROTOMY  08/30/2018   Procedure: SPHINCTEROTOMY;  Surgeon: Clarene Essex, MD;  Location: [facility];  Service: Endoscopy;;   TOTAL KNEE ARTHROPLASTY Bilateral      reports that she has never smoked. She has never used smokeless tobacco. She reports that she does not drink alcohol and does not use drugs.  Allergies  Allergen Reactions   Amoxicillin-Pot Clavulanate Diarrhea   Catapres [Clonidine Hcl] Other (See Comments)    Unknown.    Codeine Other (See Comments)    "Makes me out of my head, loopy"   Hydrocodone Other (See Comments)    "makes me go out of my head, loopy"   Lisinopril Cough   Other Other (See Comments)    Any type of narcotics Feels "loopy"    Sulfa Antibiotics Itching and Other (See Comments)   Sulfamethoxazole Diarrhea   Verapamil Diarrhea and Other (See Comments)   Sulfasalazine Itching    Family History  Problem Relation Age of Onset   Hypertension Mother    Diabetes Mellitus I Mother    Hypertension Father      Prior to Admission medications   Medication Sig Start Date End Date Taking? Authorizing Provider  acetaminophen (TYLENOL) 650 MG CR tablet Take 1,300 mg by mouth every 8 (eight) hours as needed for pain.    [provider]  albuterol (VENTOLIN HFA) 108 (90 Base) MCG/ACT  inhaler Inhale 2 puffs into the lungs every 6 (six) hours as needed for wheezing or shortness of breath.    [provider]  amLODipine (NORVASC) 10 MG tablet Take 10 mg by mouth daily. 03/03/19   [provider]  aspirin EC 81 MG tablet Take 81 mg by mouth daily.    [provider]  Carboxymethylcellulose Sod PF (REFRESH PLUS) 0.5 % SOLN Place 1 drop into both eyes daily as needed (eye injections).    [provider]  Cholecalciferol (VITAMIN D-3) 125 MCG (5000 UT) TABS Take 5,000 Units by mouth every Monday.    [provider]  ciprofloxacin (CIPRO) 500 MG tablet Take 500 mg by mouth every 12 (twelve) hours.  11/20/19   [provider]  clopidogrel (PLAVIX) 75 MG tablet Take 1 tablet (75 mg total) by mouth daily. 12/24/20   Dagoberto Ligas, PA-C  colchicine 0.6 MG tablet Take 1 tablet (0.6 mg total) by mouth daily. 08/05/20   Landis Martins, DPM  famotidine (PEPCID) 20 MG tablet Take one after  supper Patient taking differently: Take one after  supper 11/06/19   Tanda Rockers, MD  fentaNYL (DURAGESIC) 25 MCG/HR Place 1 patch onto the skin every 3 (three) days. 02/17/19   [provider]  ferrous sulfate 325 (65 FE) MG tablet Take 325 mg by mouth at bedtime.    [provider]  fluconazole (DIFLUCAN) 150 MG tablet Take 1 tablet by mouth once. May repeat in 3 days if symptoms persist. 03/11/21   Vu, Trung T, MD  furosemide (LASIX) 20 MG tablet Take 20 mg by mouth daily.    [provider]  JANUVIA 50 MG tablet Take 50 mg by mouth daily.  03/11/15   [provider]  levalbuterol Penne Lash) 1.25 MG/3ML nebulizer solution Take 1.25 mg by nebulization every 4 (four) hours as needed for shortness of breath or wheezing. 08/05/19   [provider]  lidocaine (XYLOCAINE) 5 % ointment APPLY TOPICALLY AS NEEDED. TOES FOR PAIN 06/29/20   Landis Martins, DPM  loratadine (CLARITIN) 10 MG tablet 1 tablet    [provider]  losartan (COZAAR) 25 MG tablet Take 25 mg by mouth daily. 10/28/20   [provider]  losartan (COZAAR) 50 MG tablet TAKE 1 TABLET(50 MG) BY MOUTH DAILY 09/20/20   Tanda Rockers, MD  Magnesium 250 MG TABS     [provider]  metroNIDAZOLE (FLAGYL) 500 MG tablet TAKE 1 TABLET(500 MG) BY MOUTH TWICE DAILY 02/24/21   Mignon Pine, DO  Multiple Vitamin (MULTIVITAMIN WITH MINERALS) TABS tablet Take 1 tablet by mouth daily.    [provider]  Multiple Vitamins-Minerals (IMMUNE SUPPORT PO) Take 1 tablet by mouth daily.    [provider]  Omega 3 1000 MG CAPS 1 capsule    [provider]  ondansetron (ZOFRAN) 4 MG tablet  01/26/21   [provider]  ONETOUCH VERIO test strip USE TO CHECK BLOOD SUGAR ONCE DAILY. 11/19/19   [provider]  pantoprazole (PROTONIX) 40 MG tablet Take 1 tablet (40 mg total) by mouth daily. Take 30-60 min before first meal of the day Patient taking differently: Take 40 mg by mouth 2 (two) times daily. 08/07/19   Tanda Rockers, MD  potassium chloride (K-DUR) 10 MEQ tablet Take 10 mEq by mouth daily. 03/03/19   [provider]  SYSTANE COMPLETE 0.6 % SOLN Place 2 drops into both eyes 2 (two) times daily. 11/10/19   [provider]  vitamin B-12 (CYANOCOBALAMIN) 1000 MCG tablet Take 1,000 mcg by mouth every Sunday.    [provider]  Wound Dressings (FIBRACOL EX) Apply 1 patch topically every other day.    [provider]  Wound Dressings (PROMOGRAN EX) Apply 1 patch topically every other day.    [provider]    Physical Exam: Vitals:   03/14/21 1608 03/14/21 1745 03/14/21 1830 03/14/21 1838  BP: 124/78 139/83 (!) 120/58   Pulse: 81 78 75   Resp: '18 18 16   '$ Temp:      TempSrc:      SpO2: 100% 95% 98%   Weight:    76.7 kg  Height:  $'5\' 4"'t$  (1.626 m)    Constitutional: NAD, calm, comfortable Vitals:   03/14/21 1608 03/14/21 1745 03/14/21  1830 03/14/21 1838  BP: 124/78 139/83 (!) 120/58   Pulse: 81 78 75   Resp: '18 18 16   '$ Temp:      TempSrc:      SpO2: 100% 95% 98%   Weight:    76.7 kg  Height:    '5\' 4"'$  (1.626 m)   Eyes: PERRL, lids and conjunctivae normal ENMT: Mucous membranes are moist. Posterior pharynx clear of any exudate or lesions.Normal dentition.  Neck: normal, supple, no masses, no thyromegaly Respiratory: clear to auscultation bilaterally, no wheezing, no crackles. Normal respiratory effort. No accessory muscle use.  Tenderness on the left upper rib cage Cardiovascular: Regular rate and rhythm, no murmurs / rubs / gallops. No extremity edema. 2+ pedal pulses. No carotid bruits.  Abdomen: no tenderness, no masses palpated. No hepatosplenomegaly. Bowel sounds positive.  Musculoskeletal: no clubbing / cyanosis. No joint deformity upper and lower extremities. Good ROM, no contractures. Normal muscle tone.  Skin: Left big toe ulcer clean, no discharge, no tenderness, no foull smell. Neurologic: CN 2-12 grossly intact. Sensation intact, DTR normal. Strength 5/5 in all 4.  Psychiatric: Normal judgment and insight. Alert and oriented x 3. Normal mood.   (  Labs on Admission: I have personally reviewed following labs and imaging studies  CBC: Recent Labs  Lab 03/14/21 1418  WBC 6.2  HGB 15.0  HCT 47.1*  MCV 91.3  PLT [id number]   Basic Metabolic Panel: Recent Labs  Lab 03/14/21 1432  NA 130*  K 4.0  CL 90*  CO2 32  GLUCOSE 209*  BUN 8  CREATININE 0.85  CALCIUM 9.5   GFR: Estimated Creatinine Clearance: 37 mL/min (by C-G formula based on SCr of 0.85 mg/dL). Liver Function Tests: Recent Labs  Lab 03/14/21 1432  AST 28  ALT 15  ALKPHOS 58  BILITOT 0.6  PROT 6.8  ALBUMIN 3.0*   No results for input(s): LIPASE, AMYLASE in the last 168 hours. No results for input(s): AMMONIA in the last 168 hours. Coagulation Profile: No results for input(s): INR, PROTIME in the last 168 hours. Cardiac  Enzymes: No results for input(s): CKTOTAL, CKMB, CKMBINDEX, TROPONINI in the last 168 hours. BNP (last 3 results) No results for input(s): PROBNP in the last 8760 hours. HbA1C: No results for input(s): HGBA1C in the last 72 hours. CBG: No results for input(s): GLUCAP in the last 168 hours. Lipid Profile: No results for input(s): CHOL, HDL, LDLCALC, TRIG, CHOLHDL, LDLDIRECT in the last 72 hours. Thyroid Function Tests: No results for input(s): TSH, T4TOTAL, FREET4, T3FREE, THYROIDAB in the last 72 hours. Anemia Panel: No results for input(s): VITAMINB12, FOLATE, FERRITIN, TIBC, IRON, RETICCTPCT in the last 72 hours. Urine analysis:    Component Value Date/Time   COLORURINE YELLOW 11/02/2019 2013   APPEARANCEUR HAZY (A) 11/02/2019 2013   LABSPEC 1.015 11/02/2019 2013   PHURINE 5.0 11/02/2019 2013   GLUCOSEU NEGATIVE 11/02/2019 2013   HGBUR NEGATIVE 11/02/2019 2013   [place] NEGATIVE 11/02/2019 2013   [place] 11/02/2019 2013   PROTEINUR NEGATIVE 11/02/2019 2013   UROBILINOGEN 1.0 04/07/2015 2312   NITRITE NEGATIVE 11/02/2019 2013   LEUKOCYTESUR LARGE (A) 11/02/2019 2013    Radiological Exams on Admission: DG Chest 2 View  Result Date: 03/14/2021 CLINICAL DATA:  Nonradiating chest pain since last night. EXAM: CHEST - 2 VIEW COMPARISON:  Radiographs 11/02/2019. FINDINGS: The heart size and  mediastinal contours are stable with aortic atherosclerosis and evidence of a moderate to large hiatal hernia. There is stable mild scarring at both lung bases. The lungs are otherwise clear. There is no pleural effusion or pneumothorax. There are degenerative changes in the spine status post lumbar spinal augmentation. Previous bilateral shoulder arthroplasty with chronic superior migration of both prostheses. IMPRESSION: Stable chest without evidence of acute process. Moderate to large hiatal hernia. Electronically Signed   By: Richardean Sale M.D.   On: 03/14/2021 15:23    EKG:  Independently reviewed.  Sinus, no acute ST changes.  Assessment/Plan Active Problems:   Chest pain  (please populate well all problems here in Problem List. (For example, if patient is on BP meds at home and you resume or decide to hold them, it is a problem that needs to be her. Same for CAD, COPD, HLD and so on)  Noncardiac chest pain -ACS unlikely.  Likely costochondritis which worsened with cough. -Trial of lidocaine patch.  Acute on chronic ambulation dysfunction -Deconditioning secondary to persistent cough.  PT evaluation, expect patient can go home after PT evaluation back to home care with home PT.  New onset of cough -Rule out COVID infection.  X-ray shows no signs of pneumonia viral. -Supportive care with Tessalon, guaifenesin.  Incentive spirometry.  Left big toe ulcer and osteomyelitis -Had 3 weeks completed antibiotic treatment, ulcer appears to be healed.  Consult wound care.  Placed call to infection disease and waiting for callback. -Plans to monitor off ABX until seen by ID. Placed a call to infectious disease to get opinion about whether to continue antibiotics treatment for the left total infection.  Call tomorrow AM  IIDM with hyperglycemia -Continue Januvia, and Tradjenta add sliding scale.  Hyponatremia -Euvolemic, repeat BMP, and consider hyponatremia work-up.  CKD stage III -Euvolemic, creatinine level stable.  HTN -Controlled, continue amlodipine and ARB.  PVD -Continue aspirin, Plavix, statin.  DVT prophylaxis: Lovenox Code Status: Full code Family Communication: Daughter at bedside Disposition Plan: Expect less than 2 midnight hospital stay Consults called: Placed a call to infectious disease to get opinion about whether to continue antibiotics treatment for the left total infection.  Call tomorrow AM Admission status: Telemetry observation   Lequita Halt MD [facility] Pager [phone]  03/14/2021, 7:18 PM

## 2021-03-14 NOTE — ED Provider Notes (Signed)
[facility] EMERGENCY DEPARTMENT Provider Note   CSN: [id number] Arrival date & time: 03/14/21  1405     History Chief Complaint  Patient presents with   Chest Pain    Heidi Dominguez is a 87 y.o. female.  Patient is a 87 year old female with past medical history as seen below presenting for complaints of chest pain.  Admits to left-sided wall pain that began several days ago and is worsening severity, worse with left arm movement and coughing.  She admits to dry cough that began several days ago.  Admits to nasal congestion and sore throat.  Denies any fevers, chills, nausea, vomiting, or diarrhea.  Asked to exposure to COVID-19 virus by home health care nurse diagnosed positive today but symptom onset several days ago.  She has had a negative PCR by primary care physician within the last 2-3 days when her cough started.  Pt has been vaccinated for COVID-19 virus.  Of note, ptis currently being seen at wound center for left great toe ulceration and was completing PICC line antibiotics for osteomyelitis.  Patient's daughter at bedside states she received 3 weeks of IV antibiotics till last week when her IV infiltrated and to be removed.  They plans to follow-up in the office this week to see if patient needs to receive another IV for IV antibiotics or be switched to oral.  He states that left great toe pain is controlled, denies any redness, swelling, or purulent drainage.  The history is provided by the patient. No language interpreter was used.  Chest Pain Pain location:  L chest Pain radiates to:  Does not radiate Pain severity:  Mild Onset quality:  Gradual Timing:  Constant Progression:  Worsening Associated symptoms: cough   Associated symptoms: no abdominal pain, no back pain, no fever, no palpitations, no shortness of breath and no vomiting       Past Medical History:  Diagnosis Date   Acute pancreatitis    hx of    Arthritis    "qwhere" (07/04/2018)    Chronic back pain    "all over" (07/04/2018)   Chronic diastolic CHF (congestive heart failure) (HCC)    Chronic kidney disease    stage  3 chronic kidney disease    Complication of anesthesia    "I have a hard time waking up"   Degenerative joint disease of shoulder region    Dry eye syndrome    Dyspnea    Family history of adverse reaction to anesthesia    "daughter has the shakes when she wakes up"   GERD (gastroesophageal reflux disease)    Heart murmur    High cholesterol    History of hiatal hernia    HOH (hard of hearing)    Hypertension    Impacted cerumen of both ears    hx of    Muscle weakness (generalized)    Osteoarthritis    Overactive bladder    Pancreatitis    hx of    Phlebitis    "BLE"   Rotator cuff tear    right    Tear of right supraspinatus tendon    Thrombocytopenia (HCC)    hx of    Type II diabetes mellitus ([place])    Urinary tract infection    hx of    Xerosis cutis    hx of     Patient Active Problem List   Diagnosis Date Noted   Chest pain 03/14/2021   Costochondritis  Osteomyelitis ([place]) 01/12/2021   Peripheral artery disease ([place]) 12/22/2020   Diabetic foot ulcer ([place]) 09/02/2020   Diabetic neuropathy ([place]) 09/02/2020   Gait abnormality 09/02/2020   Diabetic retinopathy associated with type 2 diabetes mellitus ([place]) 09/02/2020   Hardening of the aorta (main artery of the heart) ([place]) 09/02/2020   Pure hypercholesterolemia 09/02/2020   Thrombocytopenia ([place]) 09/02/2020   Gastroesophageal reflux disease 10/22/2019   Unspecified chronic bronchitis ([place]) 09/02/2019   Infection or inflammatory reaction due to internal joint prosthesis ([place]) 02/20/2019   Rhinitis, chronic 01/07/2019   Infection of shoulder ([place]) 11/08/2018   History of hemiarthroplasty of right shoulder 09/25/2018   UTI (urinary tract infection) 09/12/2018   Acute pancreatitis 08/28/2018   Acute recurrent pancreatitis    Abdominal pain 08/23/2018   Elevated LFTs  07/04/2018   CKD (chronic kidney disease) stage 3, GFR 30-59 ml/min (HCC) 07/04/2018   Upper airway cough syndrome 03/13/2018   Sensorineural hearing loss (SNHL), bilateral 12/04/2016   Post-nasal drainage 03/14/2016   Presbycusis of both ears 03/14/2016   Bilateral hearing loss 10/06/2015   Bilateral impacted cerumen 10/06/2015   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   Subjective tinnitus of both ears 10/06/2015   Choledocholithiasis with chronic cholecystitis, nonobstructing 04/11/2015   Pre-operative cardiovascular examination, high risk surgery    Epigastric abdominal pain 04/08/2015   Acute gallstone pancreatitis s/p lap chole w IOC 04/11/2015 04/08/2015   Diabetes mellitus type 2, controlled ([place]) 04/08/2015   Gallstone pancreatitis 04/08/2015   Mitral regurgitation 04/08/2015   Chronic diastolic CHF (congestive heart failure) ([place]) 04/08/2015   DOE (dyspnea on exertion) 03/31/2015   Ischemic colitis ([place]) 08/09/2012   Colitis 08/07/2012   Rectal bleeding 08/07/2012   Weakness 09/24/2011   Bradycardia 09/24/2011   Acute lower UTI 09/24/2011   Hematochezia 09/24/2011   Essential hypertension 09/24/2011   Dyslipidemia 09/24/2011    Past Surgical History:  Procedure Laterality Date   ABDOMINAL AORTOGRAM W/LOWER EXTREMITY N/A 12/22/2020   Procedure: ABDOMINAL AORTOGRAM W/LOWER EXTREMITY;  Surgeon: Cherre Robins, MD;  Location: [facility];  Service: Cardiovascular;  Laterality: N/A;   ABDOMINAL HYSTERECTOMY     BACK SURGERY     CATARACT EXTRACTION W/ INTRAOCULAR LENS  IMPLANT, BILATERAL Bilateral    CHOLECYSTECTOMY  2016   ERCP N/A 08/30/2018   Procedure: ENDOSCOPIC RETROGRADE CHOLANGIOPANCREATOGRAPHY (ERCP);  Surgeon: Clarene Essex, MD;  Location: [facility];  Service: Endoscopy;  Laterality: N/A;   FIXATION KYPHOPLASTY     INCISION AND DRAINAGE Right 11/08/2018   Procedure: INCISION AND DRAINAGE right shoulder, placement of antibiotic beads ;   Surgeon: [name], MD;  Location: [facility];  Service: Orthopedics;  Laterality: Right;   JOINT REPLACEMENT     LAPAROSCOPIC CHOLECYSTECTOMY SINGLE SITE WITH INTRAOPERATIVE CHOLANGIOGRAM N/A 04/11/2015   Procedure: LAPAROSCOPIC LYSIS OF ADHESIONS, LAPAROSCOPIC CHOLECYSTECTOMY WITH INTRAOPERATIVE CHOLANGIOGRAM;  Surgeon: Michael Boston, MD;  Location: [facility];  Service: General;  Laterality: N/A;   PANCREATIC STENT PLACEMENT  08/30/2018   Procedure: PANCREATIC STENT PLACEMENT;  Surgeon: Clarene Essex, MD;  Location: [facility];  Service: Endoscopy;;   PERIPHERAL VASCULAR INTERVENTION  12/22/2020   Procedure: PERIPHERAL VASCULAR INTERVENTION;  Surgeon: Cherre Robins, MD;  Location: [facility];  Service: Cardiovascular;;  left anterior tibial artery   REMOVAL OF STONES  08/30/2018   Procedure: REMOVAL OF STONES;  Surgeon: Clarene Essex, MD;  Location: [facility];  Service: Endoscopy;;   SHOULDER OPEN ROTATOR CUFF REPAIR Bilateral  SPHINCTEROTOMY  08/30/2018   Procedure: SPHINCTEROTOMY;  Surgeon: Clarene Essex, MD;  Location: [facility];  Service: Endoscopy;;   TOTAL KNEE ARTHROPLASTY Bilateral      OB History   No obstetric history on file.     Family History  Problem Relation Age of Onset   Hypertension Mother    Diabetes Mellitus I Mother    Hypertension Father     Social History   Tobacco Use   Smoking status: Never   Smokeless tobacco: Never  Vaping Use   Vaping Use: Never used  Substance Use Topics   Alcohol use: No    Comment: rare   Drug use: No    Home Medications Prior to Admission medications   Medication Sig Start Date End Date Taking? Authorizing Provider  acetaminophen (TYLENOL) 650 MG CR tablet Take 1,300 mg by mouth every 8 (eight) hours as needed for pain.   Yes [provider]  albuterol (VENTOLIN HFA) 108 (90 Base) MCG/ACT inhaler Inhale 2 puffs into the lungs every 6 (six) hours as needed for wheezing or shortness of breath.   Yes [provider]  amLODipine (NORVASC) 10 MG tablet Take 10 mg by mouth daily. 03/03/19  Yes [provider]  aspirin EC 81 MG tablet Take 81 mg by mouth at bedtime.   Yes [provider]  cetirizine (ZYRTEC) 10 MG tablet Take 10 mg by mouth at bedtime.   Yes [provider]  Cholecalciferol (VITAMIN D-3) 125 MCG (5000 UT) TABS Take 5,000 Units by mouth every Monday.   Yes [provider]  clopidogrel (PLAVIX) 75 MG tablet Take 1 tablet (75 mg total) by mouth daily. 12/24/20  Yes Dagoberto Ligas, PA-C  doxycycline (VIBRA-TABS) 100 MG tablet Take 100 mg by mouth every morning.   Yes [provider]  fentaNYL (DURAGESIC) 25 MCG/HR Place 1 patch onto the skin every 3 (three) days. 02/17/19  Yes [provider]  ferrous sulfate 325 (65 FE) MG tablet Take 325 mg by mouth at bedtime.   Yes [provider]  fluconazole (DIFLUCAN) 150 MG tablet Take 1 tablet by mouth once. May repeat in 3 days if symptoms persist. Patient taking differently: Take 150 mg by mouth See admin instructions. Take 1 tablet  (150 mg) by mouth once. May repeat in 3 days if symptoms persist. 03/11/21  Yes Vu, Johnny Bridge T, MD  furosemide (LASIX) 20 MG tablet Take 20 mg by mouth daily.   Yes [provider]  JANUVIA 50 MG tablet Take 50 mg by mouth daily.  03/11/15  Yes [provider]  levalbuterol (XOPENEX) 1.25 MG/3ML nebulizer solution Take 1.25 mg by nebulization every 4 (four) hours as needed for shortness of breath or wheezing. 08/05/19  Yes [provider]  losartan (COZAAR) 25 MG tablet Take 25 mg by mouth daily. 10/28/20  Yes [provider]  metroNIDAZOLE (FLAGYL) 500 MG tablet TAKE 1 TABLET(500 MG) BY MOUTH TWICE DAILY Patient taking differently: Take 500 mg by mouth 2 (two) times daily. 02/24/21  Yes Mignon Pine, DO  Multiple Vitamin (MULTIVITAMIN WITH MINERALS) TABS tablet Take 1 tablet by mouth daily. One a Day for 67 +   Yes  [provider]  Multiple Vitamins-Minerals (PRESERVISION AREDS 2) CAPS Take 1 capsule by mouth every morning.   Yes [provider]  ondansetron (ZOFRAN) 4 MG tablet Take 4 mg by mouth every 8 (eight) hours as needed for nausea or vomiting. 01/26/21  Yes [provider]  pantoprazole (PROTONIX) 40 MG tablet Take 1 tablet (40 mg total) by mouth daily. Take 30-60 min before first meal of the day Patient taking differently: Take 40 mg by mouth 2 (two) times daily. 08/07/19  Yes Tanda Rockers, MD  potassium chloride (K-DUR) 10 MEQ tablet Take 10 mEq by mouth daily. 03/03/19  Yes [provider]  SYSTANE COMPLETE 0.6 % SOLN Place 2 drops into both eyes 2 (two) times daily. 11/10/19  Yes [provider]  vitamin B-12 (CYANOCOBALAMIN) 1000 MCG tablet Take 1,000 mcg by mouth every Sunday.   Yes [provider]  Carboxymethylcellulose Sod PF (REFRESH PLUS) 0.5 % SOLN Place 1 drop into both eyes daily as needed (eye injections). Patient not taking: No sig reported    [provider]  ciprofloxacin (CIPRO) 500 MG tablet Take 500 mg by mouth every 12 (twelve) hours. Patient not taking: Reported on 03/14/2021 11/20/19   [provider]  colchicine 0.6 MG tablet Take 1 tablet (0.6 mg total) by mouth daily. Patient not taking: No sig reported 08/05/20   Landis Martins, DPM  famotidine (PEPCID) 20 MG tablet Take one after  supper Patient taking differently: Take one after  supper 11/06/19   Tanda Rockers, MD  lidocaine (XYLOCAINE) 5 % ointment APPLY TOPICALLY AS NEEDED. TOES FOR PAIN Patient not taking: No sig reported 06/29/20   Landis Martins, DPM  loratadine (CLARITIN) 10 MG tablet 1 tablet Patient not taking: No sig reported    [provider]  losartan (COZAAR) 50 MG tablet TAKE 1 TABLET(50 MG) BY MOUTH DAILY Patient not taking: No sig reported 09/20/20   Tanda Rockers, MD  [facility] VERIO test strip USE TO CHECK BLOOD SUGAR  ONCE DAILY. 11/19/19   [provider]    Allergies    Amoxicillin-pot clavulanate, Catapres [clonidine hcl], Codeine, Hydrocodone, Lisinopril, Other, Sulfa antibiotics, Sulfamethoxazole, Verapamil, and Sulfasalazine  Review of Systems   Review of Systems  Constitutional:  Negative for chills and fever.  HENT:  Positive for congestion and sore throat. Negative for ear pain.   Eyes:  Negative for pain and visual disturbance.  Respiratory:  Positive for cough. Negative for shortness of breath.   Cardiovascular:  Positive for chest pain. Negative for palpitations.  Gastrointestinal:  Negative for abdominal pain and vomiting.  Genitourinary:  Negative for dysuria and hematuria.  Musculoskeletal:  Negative for arthralgias and back pain.  Skin:  Negative for color change and rash.  Neurological:  Negative for seizures and syncope.  All other systems reviewed and are negative.  Physical Exam Updated Vital Signs BP (!) 144/79   Pulse 85   Temp 99 F (37.2 C)   Resp 17   Ht '5\' 4"'$  (1.626 m)   Wt 76.7 kg   SpO2 97%   BMI 29.01 kg/m   Physical Exam Vitals and nursing note reviewed.  Constitutional:      General: She is not in acute distress.    Appearance: She is well-developed.  HENT:     Head: Normocephalic and atraumatic.  Eyes:     Conjunctiva/sclera: Conjunctivae normal.  Cardiovascular:     Rate and Rhythm: Normal rate and regular rhythm.     Heart sounds: No murmur heard. Pulmonary:     Effort: Pulmonary effort is normal. No respiratory distress.     Breath sounds: Normal breath sounds.  Chest:     Chest wall: Tenderness present.     Comments: Reproducible tenderness to left rib  8-10.  No ecchymosis or crepitus. Abdominal:     Palpations: Abdomen is soft.     Tenderness: There is no abdominal tenderness.  Musculoskeletal:     Cervical back: Neck supple.  Feet:     Comments: Wound to plantar aspect of left great toe.  No swelling, warmth, or  erythema. Skin:    General: Skin is warm and dry.  Neurological:     Mental Status: She is alert.    ED Results / Procedures / Treatments   Labs (all labs ordered are listed, but only abnormal results are displayed) Labs Reviewed  SARS CORONAVIRUS 2 (TAT 6-24 HRS) - Abnormal; Notable for the following components:      Result Value   SARS Coronavirus 2 POSITIVE (*)    All other components within normal limits  CBC - Abnormal; Notable for the following components:   RBC 5.16 (*)    HCT 47.1 (*)    All other components within normal limits  COMPREHENSIVE METABOLIC PANEL - Abnormal; Notable for the following components:   Sodium 130 (*)    Chloride 90 (*)    Glucose, Bld 209 (*)    Albumin 3.0 (*)    All other components within normal limits  HEMOGLOBIN A1C - Abnormal; Notable for the following components:   Hgb A1c MFr Bld 6.9 (*)    All other components within normal limits  BRAIN NATRIURETIC PEPTIDE  CBC WITH DIFFERENTIAL/PLATELET  BASIC METABOLIC PANEL  SEDIMENTATION RATE  C-REACTIVE PROTEIN  TROPONIN I (HIGH SENSITIVITY)  TROPONIN I (HIGH SENSITIVITY)    EKG Please see below.  Radiology DG Chest 2 View  Result Date: 03/14/2021 CLINICAL DATA:  Nonradiating chest pain since last night. EXAM: CHEST - 2 VIEW COMPARISON:  Radiographs 11/02/2019. FINDINGS: The heart size and mediastinal contours are stable with aortic atherosclerosis and evidence of a moderate to large hiatal hernia. There is stable mild scarring at both lung bases. The lungs are otherwise clear. There is no pleural effusion or pneumothorax. There are degenerative changes in the spine status post lumbar spinal augmentation. Previous bilateral shoulder arthroplasty with chronic superior migration of both prostheses. IMPRESSION: Stable chest without evidence of acute process. Moderate to large hiatal hernia. Electronically Signed   By: Richardean Sale M.D.   On: 03/14/2021 15:23    Procedures Procedures    Medications Ordered in ED Medications  acetaminophen (TYLENOL) tablet 650 mg (has no administration in time range)  aspirin EC tablet 81 mg (81 mg Oral Given 03/14/21 2105)  amLODipine (NORVASC) tablet 10 mg (10 mg Oral Given 03/14/21 2105)  furosemide (LASIX) tablet 20 mg (20 mg Oral Given 03/14/21 2106)  losartan (COZAAR) tablet 25 mg (25 mg Oral Given 03/14/21 2108)  linagliptin (TRADJENTA) tablet 5 mg (5 mg Oral Given 03/14/21 2108)  famotidine (PEPCID) tablet 10 mg (10 mg Oral Given 03/14/21 2112)  ondansetron (ZOFRAN) tablet 4 mg (has no administration in time range)  pantoprazole (PROTONIX) EC tablet 40 mg (has no administration in time range)  clopidogrel (PLAVIX) tablet 75 mg (75 mg Oral Given 03/14/21 2106)  ferrous sulfate tablet 325 mg (325 mg Oral Given 03/14/21 2112)  vitamin B-12 (CYANOCOBALAMIN) tablet 1,000 mcg (has no administration in time range)  potassium chloride (KLOR-CON) CR tablet 10 mEq (has no administration in time range)  albuterol (PROVENTIL) (2.5 MG/3ML) 0.083% nebulizer solution 2.5 mg (has no administration in time range)  loratadine (CLARITIN) tablet 10 mg (10 mg Oral Given 03/14/21 2108)  polyvinyl alcohol (LIQUIFILM  TEARS) 1.4 % ophthalmic solution 1 drop (has no administration in time range)  lidocaine (XYLOCAINE) 5 % ointment (has no administration in time range)  enoxaparin (LOVENOX) injection 30 mg (30 mg Subcutaneous Given 03/14/21 2106)  insulin aspart (novoLOG) injection 0-15 Units (has no administration in time range)  benzonatate (TESSALON) capsule 100 mg (100 mg Oral Given 03/14/21 2243)  guaiFENesin (MUCINEX) 12 hr tablet 1,200 mg (1,200 mg Oral Given 03/14/21 2112)  lidocaine (LIDODERM) 5 % 1 patch (1 patch Transdermal Patch Applied 03/14/21 2107)    ED Course  I have reviewed the triage vital signs and the nursing notes.  Pertinent labs & imaging results that were available during my care of the patient were reviewed by me and considered in my medical decision  making (see chart for details).    MDM Rules/Calculators/A&P                           87 year old female with past medical history as seen below presenting for complaints of chest pain, declining functional status at home including new inability to walk due to generalized weakness, and COVID-19 exposure by home care nurse.  On exam patient is alert and oriented x3, no acute distress, afebrile, stable vital signs.  Physical exam demonstrates bilateral breath sounds with no adventitious lung sounds.  No hypoxia.  EKG stable with no ST segment elevation or depression.  Stable troponin, BNP, and chest x-ray.  No leukocytosis. No pneumonia.  No pleural effusions.  Stable renal function. Covid 19 PCR ordered and pending.  Patient's left-sided chest pain is reproducible and likely secondary to costochondritis from coughing.  I spoke with patient's daughter who expresses concern with taking her mother home at this time due to declining functional status and inability to walk despite having home nursing care already established.  I spoke with admitting physician Dr.Zhang agrees to except patient at this time.  Final Clinical Impression(s) / ED Diagnoses Final diagnoses:  Chest pain, unspecified type  Costochondritis  Close exposure to COVID-19 virus  Cough  Sore throat  Declining functional status    Rx / DC Orders ED Discharge Orders     None        Lianne Cure, DO [id number]

## 2021-03-14 NOTE — ED Provider Notes (Signed)
Emergency Medicine Provider Triage Evaluation Note  Heidi Dominguez , a 87 y.o. female  was evaluated in triage.  Pt complains of left-sided chest pain that started last night.  Not exertional, does not radiate.  8/10.  Aggravated by deep inhalation.  Relieved by nitro via EMS.  Review of Systems  Positive: Chest pain, shortness of breath Negative: Palpitations, nausea, vomiting  Physical Exam  BP 114/68   Pulse 90   Temp 98.8 F (37.1 C) (Oral)   Resp 20   SpO2 100%  Gen:   Awake, no distress   Resp:  Normal effort  MSK:   Moves extremities without difficulty  Other:  RRR no M/R street.  Lungs CTA B.  Fentanyl patch in the right deltoid.  Medical Decision Making  Medically screening exam initiated at 2:25 PM.  Appropriate orders placed.  Heidi Dominguez was informed that the remainder of the evaluation will be completed by another provider, this initial triage assessment does not replace that evaluation, and the importance of remaining in the ED until their evaluation is complete.  This chart was dictated using voice recognition software, Dragon. Despite the best efforts of this provider to proofread and correct errors, errors may still occur which can change documentation meaning.    Heidi Dominguez 03/14/21 1431    Regan Lemming, MD 03/14/21 2029

## 2021-03-14 NOTE — ED Triage Notes (Addendum)
Pt BIB [facility] from home for CP starting last night, non radiating 8/10.     '324mg'$  ASA  BP 139/86 Nitro x1 BP dropped to 88/46 continues to endorse CP  96% RA HR 92  20g RFA   Recently had Left PICC line removed on Friday. Endorses pain and unable to lift her Left arm since having the PICC line removed

## 2021-03-14 NOTE — ED Notes (Signed)
Patient's daughter reports the patient started getting weak on Sunday and was unable to walk. Also report a cough that was dry, but started getting more congested today while waiting in the ER waiting room. Patient is A/O. Skin is warm and dry, color WNL for ethnicity. PICC line site to inner LUA is free from swelling, redness, and excessive warmth.

## 2021-03-15 ENCOUNTER — Other Ambulatory Visit: Payer: Self-pay

## 2021-03-15 ENCOUNTER — Ambulatory Visit: Payer: Medicare Other | Admitting: Internal Medicine

## 2021-03-15 ENCOUNTER — Encounter (HOSPITAL_COMMUNITY): Payer: Self-pay | Admitting: Internal Medicine

## 2021-03-15 DIAGNOSIS — E871 Hypo-osmolality and hyponatremia: Secondary | ICD-10-CM | POA: Diagnosis present

## 2021-03-15 DIAGNOSIS — E1169 Type 2 diabetes mellitus with other specified complication: Secondary | ICD-10-CM | POA: Diagnosis present

## 2021-03-15 DIAGNOSIS — E041 Nontoxic single thyroid nodule: Secondary | ICD-10-CM | POA: Diagnosis not present

## 2021-03-15 DIAGNOSIS — R079 Chest pain, unspecified: Secondary | ICD-10-CM | POA: Diagnosis not present

## 2021-03-15 DIAGNOSIS — E11621 Type 2 diabetes mellitus with foot ulcer: Secondary | ICD-10-CM | POA: Diagnosis present

## 2021-03-15 DIAGNOSIS — R0602 Shortness of breath: Secondary | ICD-10-CM | POA: Diagnosis not present

## 2021-03-15 DIAGNOSIS — E1122 Type 2 diabetes mellitus with diabetic chronic kidney disease: Secondary | ICD-10-CM | POA: Diagnosis present

## 2021-03-15 DIAGNOSIS — Z515 Encounter for palliative care: Secondary | ICD-10-CM | POA: Diagnosis not present

## 2021-03-15 DIAGNOSIS — Z885 Allergy status to narcotic agent status: Secondary | ICD-10-CM | POA: Diagnosis not present

## 2021-03-15 DIAGNOSIS — E7800 Pure hypercholesterolemia, unspecified: Secondary | ICD-10-CM | POA: Diagnosis present

## 2021-03-15 DIAGNOSIS — I130 Hypertensive heart and chronic kidney disease with heart failure and stage 1 through stage 4 chronic kidney disease, or unspecified chronic kidney disease: Secondary | ICD-10-CM | POA: Diagnosis present

## 2021-03-15 DIAGNOSIS — Z7401 Bed confinement status: Secondary | ICD-10-CM | POA: Diagnosis not present

## 2021-03-15 DIAGNOSIS — R1312 Dysphagia, oropharyngeal phase: Secondary | ICD-10-CM | POA: Diagnosis not present

## 2021-03-15 DIAGNOSIS — K859 Acute pancreatitis without necrosis or infection, unspecified: Secondary | ICD-10-CM | POA: Diagnosis not present

## 2021-03-15 DIAGNOSIS — N1831 Chronic kidney disease, stage 3a: Secondary | ICD-10-CM | POA: Diagnosis present

## 2021-03-15 DIAGNOSIS — L97529 Non-pressure chronic ulcer of other part of left foot with unspecified severity: Secondary | ICD-10-CM | POA: Diagnosis present

## 2021-03-15 DIAGNOSIS — R2689 Other abnormalities of gait and mobility: Secondary | ICD-10-CM | POA: Diagnosis not present

## 2021-03-15 DIAGNOSIS — D696 Thrombocytopenia, unspecified: Secondary | ICD-10-CM | POA: Diagnosis present

## 2021-03-15 DIAGNOSIS — M7989 Other specified soft tissue disorders: Secondary | ICD-10-CM | POA: Diagnosis not present

## 2021-03-15 DIAGNOSIS — U071 COVID-19: Secondary | ICD-10-CM | POA: Diagnosis present

## 2021-03-15 DIAGNOSIS — R2681 Unsteadiness on feet: Secondary | ICD-10-CM | POA: Diagnosis not present

## 2021-03-15 DIAGNOSIS — Z7189 Other specified counseling: Secondary | ICD-10-CM | POA: Diagnosis not present

## 2021-03-15 DIAGNOSIS — D72819 Decreased white blood cell count, unspecified: Secondary | ICD-10-CM | POA: Diagnosis present

## 2021-03-15 DIAGNOSIS — N3281 Overactive bladder: Secondary | ICD-10-CM | POA: Diagnosis present

## 2021-03-15 DIAGNOSIS — R41841 Cognitive communication deficit: Secondary | ICD-10-CM | POA: Diagnosis not present

## 2021-03-15 DIAGNOSIS — K219 Gastro-esophageal reflux disease without esophagitis: Secondary | ICD-10-CM | POA: Diagnosis present

## 2021-03-15 DIAGNOSIS — M6281 Muscle weakness (generalized): Secondary | ICD-10-CM | POA: Diagnosis not present

## 2021-03-15 DIAGNOSIS — T380X5A Adverse effect of glucocorticoids and synthetic analogues, initial encounter: Secondary | ICD-10-CM | POA: Diagnosis not present

## 2021-03-15 DIAGNOSIS — K449 Diaphragmatic hernia without obstruction or gangrene: Secondary | ICD-10-CM | POA: Diagnosis not present

## 2021-03-15 DIAGNOSIS — I5032 Chronic diastolic (congestive) heart failure: Secondary | ICD-10-CM | POA: Diagnosis present

## 2021-03-15 DIAGNOSIS — E1165 Type 2 diabetes mellitus with hyperglycemia: Secondary | ICD-10-CM | POA: Diagnosis not present

## 2021-03-15 DIAGNOSIS — E1121 Type 2 diabetes mellitus with diabetic nephropathy: Secondary | ICD-10-CM | POA: Diagnosis not present

## 2021-03-15 DIAGNOSIS — E1151 Type 2 diabetes mellitus with diabetic peripheral angiopathy without gangrene: Secondary | ICD-10-CM | POA: Diagnosis present

## 2021-03-15 DIAGNOSIS — J9601 Acute respiratory failure with hypoxia: Secondary | ICD-10-CM | POA: Diagnosis present

## 2021-03-15 DIAGNOSIS — G8929 Other chronic pain: Secondary | ICD-10-CM | POA: Diagnosis present

## 2021-03-15 DIAGNOSIS — R609 Edema, unspecified: Secondary | ICD-10-CM | POA: Diagnosis not present

## 2021-03-15 DIAGNOSIS — J9811 Atelectasis: Secondary | ICD-10-CM | POA: Diagnosis not present

## 2021-03-15 DIAGNOSIS — I517 Cardiomegaly: Secondary | ICD-10-CM | POA: Diagnosis not present

## 2021-03-15 DIAGNOSIS — H04129 Dry eye syndrome of unspecified lacrimal gland: Secondary | ICD-10-CM | POA: Diagnosis present

## 2021-03-15 DIAGNOSIS — H9190 Unspecified hearing loss, unspecified ear: Secondary | ICD-10-CM | POA: Diagnosis present

## 2021-03-15 DIAGNOSIS — M6259 Muscle wasting and atrophy, not elsewhere classified, multiple sites: Secondary | ICD-10-CM | POA: Diagnosis not present

## 2021-03-15 DIAGNOSIS — Z87440 Personal history of urinary (tract) infections: Secondary | ICD-10-CM | POA: Diagnosis not present

## 2021-03-15 DIAGNOSIS — R531 Weakness: Secondary | ICD-10-CM | POA: Diagnosis not present

## 2021-03-15 LAB — BASIC METABOLIC PANEL
Anion gap: 9 (ref 5–15)
BUN: 6 mg/dL — ABNORMAL LOW (ref 8–23)
CO2: 29 mmol/L (ref 22–32)
Calcium: 9.2 mg/dL (ref 8.9–10.3)
Chloride: 93 mmol/L — ABNORMAL LOW (ref 98–111)
Creatinine, Ser: 0.77 mg/dL (ref 0.44–1.00)
GFR, Estimated: >60 mL/min (ref >60)
Glucose, Bld: 108 mg/dL — ABNORMAL HIGH (ref 70–99)
Potassium: 4 mmol/L (ref 3.5–5.1)
Sodium: 131 mmol/L — ABNORMAL LOW (ref 135–145)

## 2021-03-15 LAB — CBC WITH DIFFERENTIAL/PLATELET
Abs Immature Granulocytes: 0.04 10*3/uL (ref 0.00–0.07)
Basophils Absolute: 0 10*3/uL (ref 0.0–0.1)
Basophils Relative: 0 %
Eosinophils Absolute: 0 10*3/uL (ref 0.0–0.5)
Eosinophils Relative: 1 %
HCT: 46 % (ref 36.0–46.0)
Hemoglobin: 14.9 g/dL (ref 12.0–15.0)
Immature Granulocytes: 1 %
Lymphocytes Relative: 15 %
Lymphs Abs: 1 10*3/uL (ref 0.7–4.0)
MCH: 29.1 pg (ref 26.0–34.0)
MCHC: 32.4 g/dL (ref 30.0–36.0)
MCV: 89.8 fL (ref 80.0–100.0)
Monocytes Absolute: 1.2 10*3/uL — ABNORMAL HIGH (ref 0.1–1.0)
Monocytes Relative: 20 %
Neutro Abs: 3.9 10*3/uL (ref 1.7–7.7)
Neutrophils Relative %: 63 %
Platelets: 171 10*3/uL (ref 150–400)
RBC: 5.12 MIL/uL — ABNORMAL HIGH (ref 3.87–5.11)
RDW: 14.8 % (ref 11.5–15.5)
WBC: 6.2 10*3/uL (ref 4.0–10.5)
nRBC: 0 % (ref 0.0–0.2)

## 2021-03-15 LAB — C-REACTIVE PROTEIN: CRP: 1.9 mg/dL — ABNORMAL HIGH (ref <1.0)

## 2021-03-15 LAB — TROPONIN I (HIGH SENSITIVITY): Troponin I (High Sensitivity): 10 ng/L (ref <18)

## 2021-03-15 LAB — GLUCOSE, CAPILLARY
Glucose-Capillary: 147 mg/dL — ABNORMAL HIGH (ref 70–99)
Glucose-Capillary: 127 mg/dL — ABNORMAL HIGH (ref 70–99)

## 2021-03-15 LAB — CBG MONITORING, ED
Glucose-Capillary: 120 mg/dL — ABNORMAL HIGH (ref 70–99)
Glucose-Capillary: 119 mg/dL — ABNORMAL HIGH (ref 70–99)

## 2021-03-15 LAB — SEDIMENTATION RATE: Sed Rate: 25 mm/hr — ABNORMAL HIGH (ref 0–22)

## 2021-03-15 LAB — D-DIMER, QUANTITATIVE: D-Dimer, Quant: 3.98 ug/mL-FEU — ABNORMAL HIGH (ref 0.00–0.50)

## 2021-03-15 MED ORDER — DOXYCYCLINE HYCLATE 100 MG PO TABS
100.0000 mg | ORAL_TABLET | Freq: Two times a day (BID) | ORAL | Status: DC
  Administered 2021-03-15 – 2021-03-23 (×16): 100 mg via ORAL
  Filled 2021-03-15 (×16): qty 1

## 2021-03-15 MED ORDER — SODIUM CHLORIDE 0.9 % IV SOLN
200.0000 mg | Freq: Once | INTRAVENOUS | Status: AC
  Administered 2021-03-15: 200 mg via INTRAVENOUS
  Filled 2021-03-15: qty 40

## 2021-03-15 MED ORDER — METRONIDAZOLE 500 MG PO TABS
500.0000 mg | ORAL_TABLET | Freq: Two times a day (BID) | ORAL | Status: DC
  Administered 2021-03-15 – 2021-03-23 (×16): 500 mg via ORAL
  Filled 2021-03-15 (×16): qty 1

## 2021-03-15 MED ORDER — REMDESIVIR 100 MG IV SOLR
100.0000 mg | Freq: Every day | INTRAVENOUS | Status: AC
  Administered 2021-03-16 – 2021-03-19 (×4): 100 mg via INTRAVENOUS
  Filled 2021-03-15 (×4): qty 20

## 2021-03-15 MED ORDER — METRONIDAZOLE 500 MG PO TABS: 500.0000 mg | ORAL_TABLET | Freq: Three times a day (TID) | ORAL | Status: DC

## 2021-03-15 MED ORDER — ENOXAPARIN SODIUM 40 MG/0.4ML IJ SOSY
40.0000 mg | PREFILLED_SYRINGE | INTRAMUSCULAR | Status: DC
  Administered 2021-03-15 – 2021-03-22 (×8): 40 mg via SUBCUTANEOUS
  Filled 2021-03-15 (×8): qty 0.4

## 2021-03-15 MED ORDER — CHLORHEXIDINE GLUCONATE CLOTH 2 % EX PADS
6.0000 | MEDICATED_PAD | Freq: Every day | CUTANEOUS | Status: DC
  Administered 2021-03-15: 6 via TOPICAL

## 2021-03-15 NOTE — ED Notes (Signed)
[name], daughter, given update on pt care.

## 2021-03-15 NOTE — Progress Notes (Signed)
Inpatient Rehabilitation Admissions Coordinator   Inpatient rehab consult./prescreen received. Noted COVID + 03/14/2021 and under observation status. Patients are eligible to be considered for admit to the [place] [facility] when cleared from airborne precautions by acute MD or Infectious disease regardless of onset day.  Please call me with any questions.  I will not place rehab consult for consideration for CIR admit at this time.    Danne Baxter, RN, MSN Rehab Admissions Coordinator [phone] 03/15/2021 2:46 PM

## 2021-03-15 NOTE — ED Notes (Signed)
Call to unit to provide report, may be re-assigning bed will call back

## 2021-03-15 NOTE — Consult Note (Signed)
[place] [place] Nurse Consult Note: Patient states she has been doctoring on her toe for six months. States it is better. Measures 1 cm x 0.2 cm dry no drainage.  I placed a small piece of Xeroform gauze wrapped around the toe then secured with small piece of Kerlix. This dressing is to be changed daily.    Monitor the wound area(s) for worsening of condition such as: Signs/symptoms of infection, increase in size, development of or worsening of odor, development of pain, or increased pain at the affected locations.   Notify the medical team if any of these develop.  Thank you for the consult. [place] nurse will not follow at this time.   Please re-consult the [place] team if needed.  Cathlean Marseilles Tamala Julian, MSN, RN, [place], [place]le Pearl, [facility] Wound Treatment Associate Pager [phone]

## 2021-03-15 NOTE — Consult Note (Addendum)
[place] Nurse Consult Note: Reason for Consult:Left great toe lateral aspect full thickness wound, chronic, nonhealing with osteomyelitis (treated) Wound type: Neuropathic vs PAD Pressure Injury POA:NA Measurement:Bedside RN to measure and document measurements with first dressing change today Wound [id number] callus with red wound bed beneath Drainage (amount, consistency, odor) None (dry) Periwound: As previously noted, callus Dressing procedure/placement/frequency: I have provided Nursing with conservative care orders for the toe and also care orders for pressure injury prevention given the reason for admission to include a sacral prophylactic foam dressing, a pressure redistribution chair cushion for when OOB to chair and bilateral pressure redistribution heel boots.  [place] nursing team will not follow, but will remain available to this patient, the nursing and medical teams.  Please re-consult if needed. Thanks, Maudie Flakes, MSN, RN, [place], Arther Abbott  Pager# [phone]

## 2021-03-15 NOTE — Evaluation (Signed)
Physical Therapy Evaluation Patient Details Name: Heidi Dominguez MRN: [id number] DOB: 1921/01/10 Today's Date: 03/15/2021   History of Present Illness  87 yo female with onset of persistent cough that has evolved into chest pain was brought to ED, had become unable to walk.  Has new Covid dx, also new L great toe osteomyelitis.  PMHx:  CVA, memory changes, DM, CKD3, pancreatitis, CHF, HTN, osteomyelitis L great toe, OA, back pain  Clinical Impression  Pt was seen for initiation of movement on side of gurney, resulting in pt having too much limitation of flexibility and strength in LE's to stand.  Her daughter is present and was able to observe the issues, which dgtr was partially aware pt had.  Pt basically cannot get her feet underneath her, and will need therapy to increase standing and balance control to compensate and resolve.  Follow for acute PT goals.    Follow Up Recommendations [facility]    Equipment Recommendations  None recommended by PT    Recommendations for Other Services Rehab consult     Precautions / Restrictions Precautions Precautions: Fall Precaution Comments: monitor BP Restrictions Weight Bearing Restrictions: No      Mobility  Bed Mobility Overal bed mobility: Needs Assistance Bed Mobility: Supine to Sit;Sit to Supine     Supine to sit: Mod assist Sit to supine: Total assist   General bed mobility comments: requires total assist to scoot up in bed    Transfers Overall transfer level: Needs assistance Equipment used: Rolling walker (2 wheeled) Transfers: Sit to/from Stand Sit to Stand: Max assist         General transfer comment: max to partially stand  Ambulation/Gait             General Gait Details: unable  Stairs            Wheelchair Mobility    Modified Rankin (Stroke Patients Only)       Balance Overall balance assessment: Needs assistance Sitting-balance support: Feet supported Sitting balance-Leahy Scale: Fair      Standing balance support: Bilateral upper extremity supported;During functional activity Standing balance-Leahy Scale: Poor                               Pertinent Vitals/Pain Pain Assessment: Faces Faces Pain Scale: No hurt    Home Living Family/patient expects to be discharged to:: Inpatient rehab                 Additional Comments: daughter asked for this consideration    Prior Function Level of Independence: Independent with assistive device(s)         Comments: used rollator independently and has family and caregiver wiht her all the time     Hand Dominance   Dominant Hand: Right    Extremity/Trunk Assessment   Upper Extremity Assessment Upper Extremity Assessment: Generalized weakness    Lower Extremity Assessment Lower Extremity Assessment: Generalized weakness    Cervical / Trunk Assessment Cervical / Trunk Assessment: Kyphotic  Communication   Communication: HOH  Cognition Arousal/Alertness: Awake/alert Behavior During Therapy: Impulsive Overall Cognitive Status: Difficult to assess Area of Impairment: Safety/judgement;Following commands                       Following Commands: Follows one step commands with increased time;Follows one step commands inconsistently Safety/Judgement: Decreased awareness of safety;Decreased awareness of deficits     General Comments: tries to  scoot up to EOB mult times despite PT asking her to wait      General Comments General comments (skin integrity, edema, etc.): Pt has skin breakdown on L great toe, has protection of the spot with bandaging    Exercises     Assessment/Plan    PT Assessment Patient needs continued PT services  PT Problem List Decreased strength;Decreased range of motion;Decreased activity tolerance;Decreased balance;Decreased mobility;Decreased coordination;Decreased knowledge of use of DME;Decreased safety awareness;Decreased skin integrity       PT Treatment  Interventions DME instruction;Gait training;Functional mobility training;Therapeutic activities;Therapeutic exercise;Balance training;Patient/family education;Neuromuscular re-education    PT Goals (Current goals can be found in the Care Plan section)  Acute Rehab PT Goals Patient Stated Goal: to stand up PT Goal Formulation: With patient/family Time For Goal Achievement: 03/29/21 Potential to Achieve Goals: Good    Frequency Min 3X/week   Barriers to discharge Inaccessible home environment has not been able to walk but per family has caregiver and daughter 24/7    Co-evaluation               AM-PAC PT "6 Clicks" Mobility  Outcome Measure Help needed turning from your back to your side while in a flat bed without using bedrails?: A Lot Help needed moving from lying on your back to sitting on the side of a flat bed without using bedrails?: A Lot Help needed moving to and from a bed to a chair (including a wheelchair)?: A Lot Help needed standing up from a chair using your arms (e.g., wheelchair or bedside chair)?: A Lot Help needed to walk in hospital room?: Total Help needed climbing 3-5 steps with a railing? : Total 6 Click Score: 10    End of Session Equipment Utilized During Treatment: Gait belt Activity Tolerance: Patient limited by fatigue;Treatment limited secondary to medical complications (Comment) Patient left: in bed;with call bell/phone within reach;with family/visitor present Nurse Communication: Mobility status PT Visit Diagnosis: Unsteadiness on feet (R26.81);Muscle weakness (generalized) (M62.81);Difficulty in walking, not elsewhere classified (R26.2)    Time: [id number] PT Time Calculation (min) (ACUTE ONLY): 28 min   Charges:   PT Evaluation $PT Eval Moderate Complexity: 1 Mod PT Treatments $Therapeutic Activity: 8-22 mins       Heidi Dominguez 03/15/2021, 2:00 PM  Heidi Dominguez, PT MS Acute Rehab Dept. Number: [place] [facility] [place]

## 2021-03-15 NOTE — Progress Notes (Signed)
PROGRESS NOTE    Heidi Dominguez  [id number] DOB: [birth year] DOA: 03/14/2021 PCP: Lajean Manes, MD   Chief Complaint  Patient presents with   Chest Pain    Brief Narrative: 87 year old female with history of IDDM, chronic diastolic congestive heart failure, hypertension, CKD stage III, recent left big toe osteomyelitis on IV antibiotics 3 wks ago but IV line got infiltrated 4 dasy PTA so antibiotics were on hold, PVD status post left tibial artery angioplasty and stenting in 2022 May presents with persistent cough, chest pain. Patient recently diagnosed with COVID last week by home care nurse, starting last Friday having dry cough chest pain on the left side worsened with cough and movement no shortness of breath no GI symptoms. In the ED, troponin negative x1 EKG no acute ST changes, COVID-19-tested positive. Chest x-ray no acute process moderate to large hiatal hernia. Patient was admitted for further management of cough and chest pain  Subjective: Seen and examined this morning.  Patient is alert awake oriented reports he feels much improved this morning.  Cough is better.  Assessment & Plan:  Chest pain Persistent cough COVID-19 positive Chest pain Appears noncardiac in the setting of COVID-19 infection.  Chest x-ray no pulmonary process no hypoxia, no fever or leukocytosis.  Troponin has been negative x3, BNP normal in the ED.  Inflammatory markers CRP slightly elevated at 1.9, esr at 25.  Continue chest pain management with pain control lidocaine patch, antitussives.I will order D-dimer for COVID infection.  Patient appears to be high risk will benefit with remdesivir x3 doses while hospitalized. Recent Labs    03/14/21 1938  CRP 1.9*   Left big toe ulcer with osteomyelitis had completed 3 weeks of antibiotics before IV line got infiltrated 4 days PTA.  Wound care consulted, will consult ID to see if patient needs IV antibiotics or monitor off antibiotics.  Currently no  leukocytosis afebrile.  Discussed with Dr. Mariana Arn to keep PO Doxy and metronidazole for now and ID F/U.  IDDM with hyperglycemia continue Januvia Tradjenta and sliding scale insulin.  Hemoglobin A1c stable 6.9 Recent Labs  Lab 03/15/21 0729 03/15/21 1156  GLUCAP 120* 119*   CKD stage IIIa-about the baseline and euvolemic Recent Labs    12/22/20 1211 12/22/20 2020 01/12/21 0000 02/09/21 1641 03/14/21 1432 03/15/21 0500  BUN 16  --  _0 6*  CREATININE 0.90 0.74 1.17* 0.81 0.85 0.77    Hyponatremia sodium at 131.watch.  Hypertension blood pressure well controlled continue losartan, amlodipine.  PVD on aspirin Plavix and statin.  Continue same  Acute on chronic ambulatory dysfunction/deconditioning:continue PT eval GOC: Multiple comorbidities, elderly, will benefit with palliative care evaluation while hospitalized  Diet Order             Diet heart healthy/carb modified Room service appropriate? Yes; Fluid consistency: Thin  Diet effective now                 Patient's Body mass index is 29.01 kg/m. DVT prophylaxis: enoxaparin (LOVENOX) injection 30 mg Start: 03/14/21 1930 Code Status:   Code Status: Full Code  Family Communication: plan of care discussed with patient at bedside. Status is: admitted as Observation, remains hospitalized for ongoing management Dispo: The patient is from: Home              Anticipated d/c is to:  TBD              Patient currently is not medically stable to d/c.  Difficult to place patient No Unresulted Labs (From admission, onward)     Start     Ordered   03/16/21 0500  Comprehensive metabolic panel  Daily,   R      03/15/21 1406   03/16/21 0500  CBC  Daily,   R      03/15/21 1406   03/15/21 0832  D-dimer, quantitative  Add-on,   AD        03/15/21 0831           Medications reviewed:  Scheduled Meds:  amLODipine  10 mg Oral Daily   aspirin EC  81 mg Oral Daily   benzonatate  100 mg Oral TID    clopidogrel  75 mg Oral Daily   doxycycline  100 mg Oral Q12H   enoxaparin (LOVENOX) injection  30 mg Subcutaneous Q24H   famotidine  10 mg Oral BID   ferrous sulfate  325 mg Oral QHS   furosemide  20 mg Oral Daily   guaiFENesin  1,200 mg Oral BID   insulin aspart  0-15 Units Subcutaneous TID WC   lidocaine  1 patch Transdermal Q24H   linagliptin  5 mg Oral Daily   loratadine  10 mg Oral Daily   losartan  25 mg Oral Daily   metroNIDAZOLE  500 mg Oral Q8H   pantoprazole  40 mg Oral Daily   potassium chloride  10 mEq Oral Daily   [START ON 03/20/2021] vitamin B-12  1,000 mcg Oral Q Sun   Continuous Infusions: Consultants:see note  Procedures:see note Antimicrobials: Anti-infectives (From admission, onward)    Start     Dose/Rate Route Frequency Ordered Stop   03/15/21 1415  doxycycline (VIBRA-TABS) tablet 100 mg        100 mg Oral Every 12 hours 03/15/21 1406     03/15/21 1415  metroNIDAZOLE (FLAGYL) tablet 500 mg        500 mg Oral Every 8 hours 03/15/21 1406        Culture/Microbiology    Component Value Date/Time   SDES  01/14/2021 1835    TOE LEFT Performed at [facility], [place] [address] [place], [place] [address]    SPECREQUEST  01/14/2021 1835    NONE Performed at [facility], [place] [address], [place], [place] [address]    CULT  01/14/2021 1835    RARE METHICILLIN RESISTANT STAPHYLOCOCCUS AUREUS NO ANAEROBES ISOLATED Performed at [facility], [place] [address] [place], [place] [address]    REPTSTATUS 01/20/2021 FINAL 01/14/2021 1835  Other culture-see note  Objective: Vitals: Today's Vitals   03/15/21 1100 03/15/21 1130 03/15/21 1145 03/15/21 1215  BP: (!) 139/97 (!) 156/89 (!) 152/87 (!) 145/70  Pulse: 74 91 78 77  Resp: 20 19 (!) 21 16  Temp:      TempSrc:      SpO2: 100% 96% 96% 94%  Weight:      Height:      PainSc:        Intake/Output Summary (Last 24 hours) at 03/15/2021 1406 Last data filed at  03/15/2021 1340 Gross per 24 hour  Intake --  Output 600 ml  Net -600 ml   Filed Weights   03/14/21 1838  Weight: 76.7 kg   Weight change:   Intake/Output from previous day: No intake/output data recorded. Intake/Output this shift: Total I/O In: -  Out: 600 [Urine:600] Filed Weights   03/14/21 1838  Weight: 76.7 kg   Examination: General  exam: AAO at baseline comfortable elderly pleasant. HEENT:Oral mucosa moist, Ear/Nose WNL grossly,dentition normal. Respiratory system: bilaterally diminished, no use of accessory muscle, non tender. Cardiovascular system: S1 & S2 +,No JVD. Gastrointestinal system: Abdomen soft, NT,ND, BS+. Nervous System:Alert, awake, moving extremities Extremities: No edema, distal peripheral pulses palpable.  Skin: No rashes,no icterus. MSK: Normal muscle bulk,tone, power Lt grt toe in dressing no drainage.  Data Reviewed: I have personally reviewed following labs and imaging studies CBC: Recent Labs  Lab 03/14/21 1418 03/15/21 0500  WBC 6.2 6.2  NEUTROABS  --  3.9  HGB 15.0 14.9  HCT 47.1* 46.0  MCV 91.3 89.8  PLT 171 347   Basic Metabolic Panel: Recent Labs  Lab 03/14/21 1432 03/15/21 0500  NA 130* 131*  K 4.0 4.0  CL 90* 93*  CO2 32 29  GLUCOSE 209* 108*  BUN 8 6*  CREATININE 0.85 0.77  CALCIUM 9.5 9.2   GFR: Estimated Creatinine Clearance: 39.4 mL/min (by C-G formula based on SCr of 0.77 mg/dL). Liver Function Tests: Recent Labs  Lab 03/14/21 1432  AST 28  ALT 15  ALKPHOS 58  BILITOT 0.6  PROT 6.8  ALBUMIN 3.0*   No results for input(s): LIPASE, AMYLASE in the last 168 hours. No results for input(s): AMMONIA in the last 168 hours. Coagulation Profile: No results for input(s): INR, PROTIME in the last 168 hours. Cardiac Enzymes: No results for input(s): CKTOTAL, CKMB, CKMBINDEX, TROPONINI in the last 168 hours. BNP (last 3 results) No results for input(s): PROBNP in the last 8760 hours. HbA1C: Recent Labs     03/14/21 1918  HGBA1C 6.9*   CBG: Recent Labs  Lab 03/15/21 0729 03/15/21 1156  GLUCAP 120* 119*   Lipid Profile: No results for input(s): CHOL, HDL, LDLCALC, TRIG, CHOLHDL, LDLDIRECT in the last 72 hours. Thyroid Function Tests: No results for input(s): TSH, T4TOTAL, FREET4, T3FREE, THYROIDAB in the last 72 hours. Anemia Panel: No results for input(s): VITAMINB12, FOLATE, FERRITIN, TIBC, IRON, RETICCTPCT in the last 72 hours. Sepsis Labs: No results for input(s): PROCALCITON, LATICACIDVEN in the last 168 hours.  Recent Results (from the past 240 hour(s))  SARS CORONAVIRUS 2 (TAT 6-24 HRS) Nasopharyngeal Nasopharyngeal Swab     Status: Abnormal   Collection Time: 03/14/21  5:43 PM   Specimen: Nasopharyngeal Swab  Result Value Ref Range Status   SARS Coronavirus 2 POSITIVE (A) NEGATIVE Final    Comment: (NOTE) SARS-CoV-2 target nucleic acids are DETECTED.  The SARS-CoV-2 RNA is generally detectable in upper and lower respiratory specimens during the acute phase of infection. Positive results are indicative of the presence of SARS-CoV-2 RNA. Clinical correlation with patient history and other diagnostic information is  necessary to determine patient infection status. Positive results do not rule out bacterial infection or co-infection with other viruses.  The expected result is Negative.  Fact Sheet for Patients: SugarRoll.be  Fact Sheet for Healthcare Providers: https://www.woods-mathews.com/  This test is not yet approved or cleared by the [place] FDA and  has been authorized for detection and/or diagnosis of SARS-CoV-2 by FDA under an Emergency Use Authorization (EUA). This EUA will remain  in effect (meaning this test can be used) for the duration of the COVID-19 declaration under Section 564(b)(1) of the Act, 21 U. S.C. section 360bbb-3(b)(1), unless the authorization is terminated or revoked sooner.   Performed at  [facility], [place] [address] [place], [place] [address]     Radiology Studies: DG Chest 2 View  Result Date: 03/14/2021 CLINICAL DATA:  Nonradiating chest pain since last night. EXAM: CHEST - 2 VIEW COMPARISON:  Radiographs 11/02/2019. FINDINGS: The heart size and mediastinal contours are stable with aortic atherosclerosis and evidence of a moderate to large hiatal hernia. There is stable mild scarring at both lung bases. The lungs are otherwise clear. There is no pleural effusion or pneumothorax. There are degenerative changes in the spine status post lumbar spinal augmentation. Previous bilateral shoulder arthroplasty with chronic superior migration of both prostheses. IMPRESSION: Stable chest without evidence of acute process. Moderate to large hiatal hernia. Electronically Signed   By: Richardean Sale M.D.   On: 03/14/2021 15:23     LOS: 0 days   Antonieta Pert, MD [facility]  03/15/2021, 2:06 PM

## 2021-03-16 ENCOUNTER — Inpatient Hospital Stay (HOSPITAL_COMMUNITY): Payer: Medicare Other

## 2021-03-16 DIAGNOSIS — Z7189 Other specified counseling: Secondary | ICD-10-CM

## 2021-03-16 DIAGNOSIS — R609 Edema, unspecified: Secondary | ICD-10-CM

## 2021-03-16 DIAGNOSIS — M7989 Other specified soft tissue disorders: Secondary | ICD-10-CM | POA: Diagnosis not present

## 2021-03-16 DIAGNOSIS — Z515 Encounter for palliative care: Secondary | ICD-10-CM

## 2021-03-16 LAB — CBC
HCT: 46.7 % — ABNORMAL HIGH (ref 36.0–46.0)
Hemoglobin: 15.4 g/dL — ABNORMAL HIGH (ref 12.0–15.0)
MCH: 29.8 pg (ref 26.0–34.0)
MCHC: 33 g/dL (ref 30.0–36.0)
MCV: 90.3 fL (ref 80.0–100.0)
Platelets: 171 10*3/uL (ref 150–400)
RBC: 5.17 MIL/uL — ABNORMAL HIGH (ref 3.87–5.11)
RDW: 14.7 % (ref 11.5–15.5)
WBC: 6.1 10*3/uL (ref 4.0–10.5)
nRBC: 0 % (ref 0.0–0.2)

## 2021-03-16 LAB — COMPREHENSIVE METABOLIC PANEL
ALT: 17 U/L (ref 0–44)
AST: 27 U/L (ref 15–41)
Albumin: 2.7 g/dL — ABNORMAL LOW (ref 3.5–5.0)
Alkaline Phosphatase: 51 U/L (ref 38–126)
Anion gap: 10 (ref 5–15)
BUN: 14 mg/dL (ref 8–23)
CO2: 28 mmol/L (ref 22–32)
Calcium: 9.1 mg/dL (ref 8.9–10.3)
Chloride: 95 mmol/L — ABNORMAL LOW (ref 98–111)
Creatinine, Ser: 0.94 mg/dL (ref 0.44–1.00)
GFR, Estimated: 55 mL/min — ABNORMAL LOW (ref >60)
Glucose, Bld: 123 mg/dL — ABNORMAL HIGH (ref 70–99)
Potassium: 4 mmol/L (ref 3.5–5.1)
Sodium: 133 mmol/L — ABNORMAL LOW (ref 135–145)
Total Bilirubin: 0.4 mg/dL (ref 0.3–1.2)
Total Protein: 6.4 g/dL — ABNORMAL LOW (ref 6.5–8.1)

## 2021-03-16 LAB — GLUCOSE, CAPILLARY
Glucose-Capillary: 108 mg/dL — ABNORMAL HIGH (ref 70–99)
Glucose-Capillary: 90 mg/dL (ref 70–99)
Glucose-Capillary: 320 mg/dL — ABNORMAL HIGH (ref 70–99)
Glucose-Capillary: 210 mg/dL — ABNORMAL HIGH (ref 70–99)

## 2021-03-16 LAB — D-DIMER, QUANTITATIVE: D-Dimer, Quant: 3.39 ug/mL-FEU — ABNORMAL HIGH (ref 0.00–0.50)

## 2021-03-16 LAB — C-REACTIVE PROTEIN: CRP: 0.9 mg/dL (ref <1.0)

## 2021-03-16 MED ORDER — SODIUM CHLORIDE 0.45 % IV SOLN: INTRAVENOUS | Status: DC

## 2021-03-16 MED ORDER — IOHEXOL 350 MG/ML SOLN
100.0000 mL | Freq: Once | INTRAVENOUS | Status: AC | PRN
  Administered 2021-03-16: 100 mL via INTRAVENOUS

## 2021-03-16 MED ORDER — DEXAMETHASONE SODIUM PHOSPHATE 4 MG/ML IJ SOLN
4.0000 mg | INTRAMUSCULAR | Status: DC
  Administered 2021-03-16 – 2021-03-23 (×8): 4 mg via INTRAVENOUS
  Filled 2021-03-16 (×8): qty 1

## 2021-03-16 NOTE — Progress Notes (Signed)
Attempted to call daughter on mobile x2 and home x1 to make her aware that pt is going to CT scan. No answer

## 2021-03-16 NOTE — TOC Initial Note (Signed)
Transition of Care [facility]) - Initial/Assessment Note    Patient Details  Name: Heidi Dominguez MRN: [id number] Date of Birth: [birth year]  Transition [facility] CM/SW Contact:    Benard Halsted, LCSW Phone Number: 03/16/2021, 4:31 PM  Clinical Narrative:                 CSW spoke with patient's daughter [name] who is at patient's bedside. CSW explained PT recommendation of SNF. Patient's daughter stated that patient normally has an caregiver agency that stays with her but they gave the patient COVID and she is not sure how she will find anyone else to come out to the house. She stated concern that not many SNFs would accept a COVID + patient. CSW affirmed that only two facilities are currently accepting COVID patients, [facility] and [facility]. She asked if patient could go to CIR. CSW explained that [facility] does not take patient's in their COVID isolation window but that [facility] does. She stated that was too far for her to drive. She asked about [facility] but CSW confirmed they are not able to accept patients with COVID. Daughter asked when patient would be discharged and CSW explained that was unknown at this time. She asked CSW to call her back once that information was known and did not want to commit to a plan at this time. CSW will continue to follow.    Expected Discharge Plan: Skilled Nursing Facility Barriers to Discharge: Continued Medical Work up, SNF Covid   Patient Goals and CMS Choice Patient states their goals for this hospitalization and ongoing recovery are:: Rehab CMS Medicare.gov Compare Post Acute Care list provided to:: Patient Choice offered to / list presented to : Patient, Adult Children  Expected Discharge Plan and Services Expected Discharge Plan: [place] In-house Referral: Clinical Social Work     Living arrangements for the past 2 months: Single Family Home                                      Prior Living  Arrangements/Services Living arrangements for the past 2 months: Single Family Home Lives with:: Other (Comment) (Caregivers) Patient language and need for interpreter reviewed:: Yes Do you feel safe going back to the place where you live?: Yes      Need for Family Participation in Patient Care: Yes (Comment) Care giver support system in place?: Yes (comment) Current home services: DME Criminal Activity/Legal Involvement Pertinent to Current Situation/Hospitalization: No - Comment as needed  Activities of Daily Living Home Assistive Devices/Equipment: Eyeglasses ADL Screening (condition at time of admission) Patient's cognitive ability adequate to safely complete daily activities?: Yes Is the patient deaf or have difficulty hearing?: No Does the patient have difficulty seeing, even when wearing glasses/contacts?: No Does the patient have difficulty concentrating, remembering, or making decisions?: No Patient able to express need for assistance with ADLs?: Yes Does the patient have difficulty dressing or bathing?: Yes Independently performs ADLs?: No Communication: Independent Dressing (OT): Needs assistance Is this a change from baseline?: Pre-admission baseline Grooming: Needs assistance Is this a change from baseline?: Pre-admission baseline Feeding: Independent Bathing: Needs assistance Is this a change from baseline?: Pre-admission baseline Toileting: Needs assistance Is this a change from baseline?: Pre-admission baseline In/Out Bed: Needs assistance Is this a change from baseline?: Pre-admission baseline Walks in Home: Appropriate for developmental age Does the patient have difficulty walking or  climbing stairs?: Yes Weakness of Legs: Left Weakness of Arms/Hands: None  Permission Sought/Granted Permission sought to share information with : Facility Sport and exercise psychologist, Family Supports Permission granted to share information with : Yes, Verbal Permission Granted  Share  Information with NAME: [name]  Permission granted to share info w AGENCY: SNFs  Permission granted to share info w Relationship: Daughter  Permission granted to share info w Contact Information: [phone]  Emotional Assessment   Attitude/Demeanor/Rapport: Engaged Affect (typically observed): Appropriate Orientation: : Oriented to Self, Oriented to Place, Oriented to  Time, Oriented to Situation Alcohol / Substance Use: Not Applicable Psych Involvement: No (comment)  Admission diagnosis:  Cough [R05.9] Costochondritis [M94.0] Sore throat [J02.9] Declining functional status [R53.81] Chest pain [R07.9] Chest pain, unspecified type [R07.9] Close exposure to COVID-19 virus [Z20.822] Patient Active Problem List   Diagnosis Date Noted   Chest pain 03/14/2021   Costochondritis    Osteomyelitis ([place]) 01/12/2021   Peripheral artery disease ([place]) 12/22/2020   Diabetic foot ulcer ([place]) 09/02/2020   Diabetic neuropathy ([place]) 09/02/2020   Gait abnormality 09/02/2020   Diabetic retinopathy associated with type 2 diabetes mellitus ([place]) 09/02/2020   Hardening of the aorta (main artery of the heart) ([place]) 09/02/2020   Pure hypercholesterolemia 09/02/2020   Thrombocytopenia ([place]) 09/02/2020   Gastroesophageal reflux disease 10/22/2019   Unspecified chronic bronchitis ([place]) 09/02/2019   Infection or inflammatory reaction due to internal joint prosthesis ([place]) 02/20/2019   Rhinitis, chronic 01/07/2019   Infection of shoulder ([place]) 11/08/2018   History of hemiarthroplasty of right shoulder 09/25/2018   UTI (urinary tract infection) 09/12/2018   Acute pancreatitis 08/28/2018   Acute recurrent pancreatitis    Abdominal pain 08/23/2018   Elevated LFTs 07/04/2018   CKD (chronic kidney disease) stage 3, GFR 30-59 ml/min (HCC) 07/04/2018   Upper airway cough syndrome 03/13/2018   Sensorineural hearing loss (SNHL), bilateral 12/04/2016   Post-nasal drainage 03/14/2016   Presbycusis of both  ears 03/14/2016   Bilateral hearing loss 10/06/2015   Bilateral impacted cerumen 10/06/2015   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   Subjective tinnitus of both ears 10/06/2015   Choledocholithiasis with chronic cholecystitis, nonobstructing 04/11/2015   Pre-operative cardiovascular examination, high risk surgery    Epigastric abdominal pain 04/08/2015   Acute gallstone pancreatitis s/p lap chole w IOC 04/11/2015 04/08/2015   Diabetes mellitus type 2, controlled ([place]) 04/08/2015   Gallstone pancreatitis 04/08/2015   Mitral regurgitation 04/08/2015   Chronic diastolic CHF (congestive heart failure) ([place]) 04/08/2015   DOE (dyspnea on exertion) 03/31/2015   Ischemic colitis ([place]) 08/09/2012   Colitis 08/07/2012   Rectal bleeding 08/07/2012   Weakness 09/24/2011   Bradycardia 09/24/2011   Acute lower UTI 09/24/2011   Hematochezia 09/24/2011   Essential hypertension 09/24/2011   Dyslipidemia 09/24/2011   PCP:  Lajean Manes, MD Pharmacy:   [facility] [address] [place] [address] Phone: [phone] Fax: [place] [phone] - [place], [place] - [address] [place] AT [place] [address] [place] [address] Phone: [phone] Fax: [phone]     Social Determinants of Health (SDOH) Interventions    Readmission Risk Interventions No flowsheet data found.

## 2021-03-16 NOTE — Evaluation (Signed)
Occupational Therapy Evaluation Patient Details Name: Heidi Dominguez MRN: [id number] DOB: Apr 04, 1921 Today's Date: 03/16/2021    History of Present Illness 87 yo female with onset of persistent cough that has evolved into chest pain was brought to ED, had become unable to walk.  Has new Covid dx, also new L great toe osteomyelitis.  PMHx:  CVA, memory changes, DM, CKD3, pancreatitis, CHF, HTN, osteomyelitis L great toe, OA, back pain   Clinical Impression   Pt walks with a rollator and is assisted for sponge bathing at bed level, dressing and all IADL. She has 24 hour care of her family and caregivers.  Pt is pleasant and readily willing to work with therapies. She demonstrated ability to perform bed mobility with max assist and stood and transferred with RW with +2  mod assist. She requires set up to total assist for ADL. Pt without 02 on upon entry into room with Sp02 of 84%, 95% on 1.5L.     Follow Up Recommendations  SNF    Equipment Recommendations  Other (comment) (defer to next venue)    Recommendations for Other Services       Precautions / Restrictions Precautions Precautions: Fall Precaution Comments: watch sats      Mobility Bed Mobility Overal bed mobility: Needs Assistance Bed Mobility: Rolling;Sidelying to Sit Rolling: Max assist Sidelying to sit: Max assist       General bed mobility comments: rolled using bed pad, assist to raise trunk    Transfers Overall transfer level: Needs assistance Equipment used: Rolling walker (2 wheeled) Transfers: Sit to/from [facility] Sit to Stand: +2 physical assistance;Mod assist Stand pivot transfers: +2 physical assistance;Mod assist       General transfer comment: cues for hand placement, assist to rise and steady with feet blocked, posterior bias    Balance Overall balance assessment: Needs assistance Sitting-balance support: Feet supported Sitting balance-Leahy Scale: Poor   Postural control:  Posterior lean Standing balance support: Bilateral upper extremity supported;During functional activity Standing balance-Leahy Scale: Poor Standing balance comment: B UE and 2 person assist                           ADL either performed or assessed with clinical judgement   ADL Overall ADL's : Needs assistance/impaired Eating/Feeding: Set up;Sitting   Grooming: Set up;Sitting   Upper Body Bathing: Maximal assistance;Sitting   Lower Body Bathing: Total assistance;+2 for physical assistance;Sit to/from stand   Upper Body Dressing : Maximal assistance;Sitting   Lower Body Dressing: Total assistance;+2 for physical assistance;Sit to/from stand   Toilet Transfer: +2 for physical assistance;Moderate assistance;Stand-pivot;RW Toilet Transfer Details (indicate cue type and reason): simulated to chair Toileting- Clothing Manipulation and Hygiene: +2 for physical assistance;Total assistance;Sit to/from stand               Vision Baseline Vision/History: Wears glasses Wears Glasses: At all times Patient Visual Report: No change from baseline       Perception     Praxis      Pertinent Vitals/Pain Pain Assessment: Faces Faces Pain Scale: No hurt     Hand Dominance Right   Extremity/Trunk Assessment Upper Extremity Assessment Upper Extremity Assessment: RUE deficits/detail RUE Deficits / Details: long standing shoulder limitations, pt reports "they took out a bone" RUE Coordination: decreased gross motor   Lower Extremity Assessment Lower Extremity Assessment: Defer to PT evaluation   Cervical / Trunk Assessment Cervical / Trunk Assessment: Kyphotic;Other exceptions (weakness)  Communication Communication Communication: HOH   Cognition Arousal/Alertness: Awake/alert Behavior During Therapy: WFL for tasks assessed/performed Overall Cognitive Status: No family/caregiver present to determine baseline cognitive functioning Area of Impairment: Following  commands                       Following Commands: Follows one step commands with increased time;Follows one step commands inconsistently           General Comments       Exercises     Shoulder Instructions      Home Living Family/patient expects to be discharged to:: Private residence Living Arrangements: Alone Available Help at Discharge: Personal care attendant;Available 24 hours/day;Family Type of Home: House Home Access: Ramped entrance     Home Layout: One level     Bathroom Shower/Tub: Other (comment) (sponge bathes)         Home Equipment: Walker - 4 wheels;Other (comment) (adjustable bed)          Prior Functioning/Environment Level of Independence: Needs assistance  Gait / Transfers Assistance Needed: walks with rollator ADL's / Homemaking Assistance Needed: assisted for bed bath, dressing and all IADL            OT Problem List: Decreased strength;Impaired balance (sitting and/or standing);Decreased activity tolerance;Decreased knowledge of use of DME or AE;Impaired UE functional use      OT Treatment/Interventions: Self-care/ADL training;DME and/or AE instruction;Therapeutic activities;Patient/family education;Balance training    OT Goals(Current goals can be found in the care plan section) Acute Rehab OT Goals Patient Stated Goal: to stand up OT Goal Formulation: With patient Time For Goal Achievement: 03/30/21 Potential to Achieve Goals: Good  OT Frequency: Min 2X/week   Barriers to D/C:            Co-evaluation PT/OT/SLP Co-Evaluation/Treatment: Yes Reason for Co-Treatment: For patient/therapist safety   OT goals addressed during session: ADL's and self-care      AM-PAC OT "6 Clicks" Daily Activity     Outcome Measure Help from another person eating meals?: A Little Help from another person taking care of personal grooming?: A Little Help from another person toileting, which includes using toliet, bedpan, or urinal?:  Total Help from another person bathing (including washing, rinsing, drying)?: A Lot Help from another person to put on and taking off regular upper body clothing?: A Lot Help from another person to put on and taking off regular lower body clothing?: Total 6 Click Score: 12   End of Session Equipment Utilized During Treatment: Rolling walker;Gait belt;Oxygen (1.5L)  Activity Tolerance: Patient tolerated treatment well Patient left: in chair;with call bell/phone within reach  OT Visit Diagnosis: Unsteadiness on feet (R26.81);Muscle weakness (generalized) (M62.81)                Time: [id number] OT Time Calculation (min): 30 min Charges:  OT General Charges $OT Visit: 1 Visit OT Evaluation $OT Eval Moderate Complexity: 1 Mod  Nestor Lewandowsky, OTR/L Acute Rehabilitation Services Pager: [phone] Office: [phone]   Malka So 03/16/2021, 11:57 AM

## 2021-03-16 NOTE — Progress Notes (Addendum)
Physical Therapy Treatment Patient Details Name: NATORI GUDINO MRN: [id number] DOB: May 04, 1921 Today's Date: 03/16/2021    History of Present Illness 87 yo female with onset of persistent cough that has evolved into chest pain was brought to ED, had become unable to walk.  Has new Covid dx, also new L great toe osteomyelitis.  PMHx:  CVA, memory changes, DM, CKD3, pancreatitis, CHF, HTN, osteomyelitis L great toe, OA, back pain    PT Comments    Patient received in bed on room air with nasal cannula not in appropriate spot, SpO2 84%; immediately replaced cannula and she quickly resaturated to the 90s on  1.5LPM O2. Needed MaxA to get to EOB, as well as heavy +2 assist to stand and pivot to the recliner- has posterior bias in sitting and standing with limited awareness/ability to correct without cues. Also needed max cues to prevent sitting too early as well as physical assist to control descent to recliner. Very easily fatigued.  Left up in recliner positioned to comfort with all needs met, chair alarm active. Would really do much better in SNF setting moving forward.    Follow Up Recommendations  SNF;Supervision/Assistance - 24 hour     Equipment Recommendations  Rolling walker with 5" wheels;3in1 (PT);Wheelchair (measurements PT);Wheelchair cushion (measurements PT)    Recommendations for Other Services       Precautions / Restrictions Precautions Precautions: Fall Precaution Comments: watch sats Restrictions Weight Bearing Restrictions: No    Mobility  Bed Mobility Overal bed mobility: Needs Assistance Bed Mobility: Rolling;Sidelying to Sit Rolling: Max assist Sidelying to sit: Max assist       General bed mobility comments: rolled using bed pad, assist to raise trunk    Transfers Overall transfer level: Needs assistance Equipment used: Rolling walker (2 wheeled) Transfers: Sit to/from [facility] Sit to Stand: +2 physical assistance;Mod assist Stand  pivot transfers: +2 physical assistance;Mod assist       General transfer comment: cues for hand placement, assist to rise and steady with feet blocked, posterior bias and tends to sit far too early  Ambulation/Gait             General Gait Details: unable   Stairs             Wheelchair Mobility    Modified Rankin (Stroke Patients Only)       Balance Overall balance assessment: Needs assistance Sitting-balance support: Feet supported Sitting balance-Leahy Scale: Poor   Postural control: Posterior lean Standing balance support: Bilateral upper extremity supported;During functional activity Standing balance-Leahy Scale: Poor Standing balance comment: B UE and 2 person assist                            Cognition Arousal/Alertness: Awake/alert Behavior During Therapy: WFL for tasks assessed/performed Overall Cognitive Status: No family/caregiver present to determine baseline cognitive functioning Area of Impairment: Following commands                       Following Commands: Follows one step commands with increased time;Follows one step commands inconsistently Safety/Judgement: Decreased awareness of safety;Decreased awareness of deficits            Exercises      General Comments        Pertinent Vitals/Pain Pain Assessment: Faces Faces Pain Scale: No hurt    Home Living  Prior Function            PT Goals (current goals can now be found in the care plan section) Acute Rehab PT Goals Patient Stated Goal: to stand up PT Goal Formulation: With patient/family Time For Goal Achievement: 03/29/21 Potential to Achieve Goals: Good Progress towards PT goals: Progressing toward goals    Frequency    Min 2X/week      PT Plan Discharge plan needs to be updated;Frequency needs to be updated    Co-evaluation              AM-PAC PT "6 Clicks" Mobility   Outcome Measure  Help needed  turning from your back to your side while in a flat bed without using bedrails?: A Lot Help needed moving from lying on your back to sitting on the side of a flat bed without using bedrails?: A Lot Help needed moving to and from a bed to a chair (including a wheelchair)?: Total Help needed standing up from a chair using your arms (e.g., wheelchair or bedside chair)?: Total Help needed to walk in hospital room?: Total Help needed climbing 3-5 steps with a railing? : Total 6 Click Score: 8    End of Session   Activity Tolerance: Patient tolerated treatment well Patient left: in chair;with call bell/phone within reach;with chair alarm set Nurse Communication: Mobility status PT Visit Diagnosis: Unsteadiness on feet (R26.81);Muscle weakness (generalized) (M62.81);Difficulty in walking, not elsewhere classified (R26.2)     Time: 1834-3735 PT Time Calculation (min) (ACUTE ONLY): 30 min  Charges:  $Therapeutic Activity: 8-22 mins (co-tx with OT)                    Windell Norfolk, DPT, PN1   Supplemental Physical Therapist [place]    Pager [phone] Acute Rehab Office [phone]

## 2021-03-16 NOTE — Plan of Care (Signed)
  Problem: Education: Goal: Knowledge of General Education information will improve Description Including pain rating scale, medication(s)/side effects and non-pharmacologic comfort measures Outcome: Progressing   Problem: Health Behavior/Discharge Planning: Goal: Ability to manage health-related needs will improve Outcome: Progressing   

## 2021-03-16 NOTE — Consult Note (Signed)
Palliative Medicine Inpatient Consult Note  Consulting Provider: Antonieta Pert, MD  Reason for consult:   [name] Palliative Medicine Consult  Reason for Consult? GOC   HPI:  Per intake H&P --> 87 year old female with history of IDDM, chronic diastolic congestive heart failure, hypertension, CKD stage III, recent left big toe osteomyelitis on IV antibiotics 3 wks ago but IV line got infiltrated 4 dasy PTA so antibiotics were on hold, PVD status post left tibial artery angioplasty and stenting in 2020-09-26 May presents with persistent cough, chest pain. Patient recently diagnosed with COVID last week by home care nurse, starting last Friday having dry cough chest pain on the left side worsened with cough and movement no shortness of breath no GI symptoms.   Palliative care was asked and involved to address goals of care in the setting of multiple chronic comorbidities and advanced age.  Clinical Assessment/Goals of Care:  *Please note that this is a verbal dictation therefore any spelling or grammatical errors are due to the "[employer]" system interpretation.  I have reviewed medical records including EPIC notes, labs and imaging, received report from bedside RN, assessed the patient who was lying in bed, she is hard of hearing but alert and oriented x3.    I met with Heidi Dominguez to further discuss diagnosis prognosis, GOC, EOL wishes, disposition and options.  We reviewed her past medical history and active medical problems inclusive of her diabetes, diastolic heart failure, chronic kidney disease, active COVID infection and active infection of her left great toe.   I introduced Palliative Medicine as specialized medical care for people living with serious illness. It focuses on providing relief from the symptoms and stress of a serious illness. The goal is to improve quality of life for both the patient and the family.  Heidi Dominguez shares with me that she is from  [place], [place] originally.  She is lived in [place], [place] for many many years.  She was married though is a widower as her husband passed away in Sep 26, 2006.  She has a son and a daughter.  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  She is a woman who has a zest for it appears.  She is of faith and practices within the [facility] denomination.  To hospitalization Heidi Dominguez had been living in a single-family home.  She has 24/7 around-the-clock caregiving who are able to help her with all basic activities of daily living.  Up until about a week ago Heidi Dominguez was able to ambulate with a Rollator walker.  She has unfortunately since her diagnosis of osteomyelitis in May started to decline and have an increase in her overall weakness.  She has not been as ambulatory though had still been able to mobilize with assistance.  A detailed discussion was had today regarding advanced directives - Heidi Dominguez shares that her daughter is her healthcare power of attorney and she does have the paperwork to support this. I asked if I may call her to make a request for this paperwork which she was in agreement with.    Concepts specific to code status, artifical feeding and hydration, continued IV antibiotics and rehospitalization was had.  I was able to educate Heidi Dominguez on the realities of cardiopulmonary resuscitation.  She shares with me that she does not want to make decisions in terms of this so should like to rely on what her daughter feels is appropriate.    For shares with  me that her goals are to improve her profound weakness and ideally be able to get back home with her 24/7 caregivers.  She did try to get out of bed with me though she was unable to stand upright.  She was able to sit at bedside for about 5 minutes unassisted.  Discussed the importance of continued conversation with family and their  medical providers regarding overall plan of care and treatment options,  ensuring decisions are within the context of the patients values and GOCs.  Provided "Hard Choices for Loving People" booklet.  _____________________________________________________ Addendum:  Called patient's daughter, Heidi Dominguez this afternoon.  I shared with her the conversation I had held with her mother.  Heidi Dominguez was frustrated with my calling as she shares I "scared her mother" talking about cardiac arrest.  I went ahead and explained to her that part of my role as a palliative provider is to help care for her and support patients dealing with chronic medical issues and serious illnesses.  I shared a big part of that is discussing what their wishes would be in the event of an emergent situation.  It did seem that Heidi Dominguez was excepting of my explanation.  She requests an update from Dr. Lupita Leash  in terms of Heidi Dominguez's plan of medical care.  I reviewed with her the goal of improvement in her generalized weakness.  Heidi Dominguez shares with me very assertively that Heidi Dominguez is a full code / full scope of care patient.  She shares that she is not excepting of DO NOT RESUSCITATE.  Decision Maker: Heidi Dominguez (daughter) [phone]  SUMMARY OF RECOMMENDATIONS   Full Code / Full Scope of Care  Requested a Copy of Advance Directives  Appreciate primary medical team providing family with a comprehensive medical update  Generalized Weakness: PT/OT evaluation is pending  Code Status/Advance Care Planning: FULL CODE   Palliative Prophylaxis:  Oral Care, Mobility  Additional Recommendations (Limitations, Scope, Preferences): Continue full scope of care   Psycho-social/Spiritual:  Desire for further Chaplaincy support: No Additional Recommendations: Education on chronic disease burden   Prognosis: (+) 2 admissions in six months, severe frailty, albumin 2.7, PPS 30% presently. Increased 12 month mortality risk  Discharge Planning: Discharge plan unclear, there are hopes patient can transition to  [facility]IR.  Vitals:   03/16/21 0312 03/16/21 0400  BP:  (!) 102/58  Pulse:  68  Resp:  12  Temp:  98.3 F (36.8 C)  SpO2: 94% 98%    Intake/Output Summary (Last 24 hours) at 03/16/2021 3762 Last data filed at 03/16/2021 8315 Gross per 24 hour  Intake --  Output 1700 ml  Net -1700 ml   Last Weight  Most recent update: 03/14/2021  6:38 PM    Weight  76.7 kg (169 lb)            Gen:  Elderly F in NAD HEENT: moist mucous membranes CV: Regular rate and rhythm  PULM: On 2LPM  ABD: soft/nontender  EXT: No edema  Neuro: Alert and oriented x3   PPS: 30%   This conversation/these recommendations were discussed with patient primary care team, Dr. Lupita Leash  Time In: 1100 Time Out: 1210 Total Time: 70 Greater than 50%  of this time was spent counseling and coordinating care related to the above assessment and plan.  [facility] Team Team Cell Phone: [phone] Please utilize secure chat with additional questions, if there is no response within 30 minutes please call the above phone number  Palliative Medicine Team providers are available by phone from 7am to 7pm daily and can be reached through the team cell phone.  Should this patient require assistance outside of these hours, please call the patient's attending physician.

## 2021-03-16 NOTE — Progress Notes (Signed)
Pt off floor to CT scan with transport.

## 2021-03-16 NOTE — Progress Notes (Signed)
PROGRESS NOTE    Heidi Dominguez  [id number] DOB: [birth year] DOA: 03/14/2021 PCP: Lajean Manes, MD   Chief Complaint  Patient presents with   Chest Pain    Brief Narrative: 87 year old female with history of IDDM, chronic diastolic congestive heart failure, hypertension, CKD stage III, recent left big toe osteomyelitis on IV antibiotics 3 wks ago but IV line got infiltrated 4 dasy PTA so antibiotics were on hold, PVD status post left tibial artery angioplasty and stenting in 2022 May presents with persistent cough, chest pain. Patient recently diagnosed with COVID last week by home care nurse, starting last Friday having dry cough chest pain on the left side worsened with cough and movement no shortness of breath no GI symptoms. In the ED, troponin negative x1 EKG no acute ST changes, COVID-19-tested positive. Chest x-ray no acute process moderate to large hiatal hernia. Patient was admitted for further management of cough and chest pain Per daughter she got sick Friday, after caregiver visited wednesday, home test was negative, pcr was negative on Friday too.Again was sick Sunday and brought to home.  Subjective:  Seen and examined this morning. Reports he vomited with mild phlegm, mild chest pain. Overnight transient hypoxia at 86%, this am on 2 L nasal cannula. Sodium up to 133, afebrile  Assessment & Plan:  Chest pain Persistent cough COVID-19 positive Acute hypoxic respiratory failure pulse ox 86% room air 8/3 am Chest pain appears noncardiac in the setting of COVID-19 infection.Chest x-ray no pulmonary process  no fever or leukocytosis.  Troponin has been negative x3,BNP normal in the ED.  Inflammatory markers CRP 1.9-0/9.  Overnight hypoxia we will start the patient on Decadron, continue remdesivir, supplemental oxygen , airborne precaution.  D-dimer is elevated will obtain CT angio chest to rule out PE in the setting of COVID-positive chest pain hypoxia after discussing  with patient's daughter and patient's niece who is a physician risk benefits .will gently hydrate her to minimize renal complications  Recent Labs    03/14/21 1938 03/15/21 1450 03/16/21 0836  DDIMER  --  3.98* 3.39*  CRP 1.9*  --  0.9  Left big toe ulcer with osteomyelitis had completed 3 weeks of antibiotics before IV line got infiltrated 4 days PTA.  Wound care consulted, discussed with ID Dr. Mariana Arn to keep PO Doxy and metronidazole for now and ID F/U.  IDDM with hyperglycemia : Blood sugar is controlled, continue Januvia Tradjenta and sliding scale insulin.  Hemoglobin A1c stable 6.9 Recent Labs  Lab 03/15/21 1156 03/15/21 1748 03/15/21 2247 03/16/21 0804 03/16/21 1152  GLUCAP 119* 147* 127* 108* 90   CKD stage IIIa-renal function at baseline.  Gentle hydration for CT angio. Recent Labs    12/22/20 1211 12/22/20 2020 01/12/21 0000 02/09/21 1641 03/14/21 1432 03/15/21 0500 03/16/21 0131  BUN 16  --  '14 10 8 '$ 6* 14  CREATININE 0.90   < > 1.17* 0.81 0.85 0.77 0.94   < > = values in this interval not displayed.    Hyponatremia: sodium improved.  Hypertension blood pressure well controlled at times soft on losartan, amlodipine. Will stop losartan.  PVD with left renal artery angioplasty and stenting in May 2022: cont her  aspirin Plavix and statin.   Acute on chronic ambulatory dysfunction/deconditioning:continue PT eval, CIR vs SNF.  GOC: Multiple comorbidities, elderly, will benefit with palliative care evaluation while hospitalized. Palliative care has reached out to the daughter who wants full scope of treatment  Diet Order  Diet heart healthy/carb modified Room service appropriate? Yes; Fluid consistency: Thin  Diet effective now                 Patient's Body mass index is 29.01 kg/m. DVT prophylaxis: enoxaparin (LOVENOX) injection 40 mg Start: 03/15/21 1930 Code Status:   Code Status: Full Code  Family Communication: plan of care  discussed with patient at bedside. Discussed plan of care with patient's daughter as well as niece who is a physician.  We discussed about different test being ordered.  Family is in agreement with plan of care  Status is: admitted as Observation, remains hospitalized for ongoing management Dispo: The patient is from: Home              Anticipated d/c is to:  TBD              Patient currently is not medically stable to d/c.   Difficult to place patient No Unresulted Labs (From admission, onward)     Start     Ordered   03/16/21 0500  Comprehensive metabolic panel  Daily,   R      03/15/21 1406   03/16/21 0500  CBC  Daily,   R      03/15/21 1406           Medications reviewed:  Scheduled Meds:  amLODipine  10 mg Oral Daily   aspirin EC  81 mg Oral Daily   benzonatate  100 mg Oral TID   Chlorhexidine Gluconate Cloth  6 each Topical Daily   clopidogrel  75 mg Oral Daily   dexamethasone (DECADRON) injection  4 mg Intravenous Q24H   doxycycline  100 mg Oral Q12H   enoxaparin (LOVENOX) injection  40 mg Subcutaneous Q24H   famotidine  10 mg Oral BID   ferrous sulfate  325 mg Oral QHS   furosemide  20 mg Oral Daily   guaiFENesin  1,200 mg Oral BID   insulin aspart  0-15 Units Subcutaneous TID WC   lidocaine  1 patch Transdermal Q24H   linagliptin  5 mg Oral Daily   loratadine  10 mg Oral Daily   losartan  25 mg Oral Daily   metroNIDAZOLE  500 mg Oral Q12H   pantoprazole  40 mg Oral Daily   potassium chloride  10 mEq Oral Daily   [START ON 03/20/2021] vitamin B-12  1,000 mcg Oral Q Sun   Continuous Infusions:  sodium chloride     remdesivir 100 mg in NS 100 mL 100 mg (03/16/21 1123)   Consultants:see note  Procedures:see note Antimicrobials: Anti-infectives (From admission, onward)    Start     Dose/Rate Route Frequency Ordered Stop   03/16/21 1000  remdesivir 100 mg in sodium chloride 0.9 % 100 mL IVPB       See Hyperspace for full Linked Orders Report.   100 mg 200  mL/hr over 30 Minutes Intravenous Daily 03/15/21 1429 03/20/21 0959   03/15/21 1600  remdesivir 200 mg in sodium chloride 0.9% 250 mL IVPB       See Hyperspace for full Linked Orders Report.   200 mg 580 mL/hr over 30 Minutes Intravenous Once 03/15/21 1429 03/15/21 1816   03/15/21 1445  metroNIDAZOLE (FLAGYL) tablet 500 mg        500 mg Oral Every 12 hours 03/15/21 1435     03/15/21 1415  doxycycline (VIBRA-TABS) tablet 100 mg        100 mg Oral Every 12 hours  03/15/21 1406     03/15/21 1415  metroNIDAZOLE (FLAGYL) tablet 500 mg  Status:  Discontinued        500 mg Oral Every 8 hours 03/15/21 1406 03/15/21 1432      Culture/Microbiology    Component Value Date/Time   SDES  01/14/2021 1835    TOE LEFT Performed at [facility], [place] [address] [place], [place] [address]    SPECREQUEST  01/14/2021 1835    NONE Performed at [facility], [place] [address] [place], [place] [address]    CULT  01/14/2021 1835    RARE METHICILLIN RESISTANT STAPHYLOCOCCUS AUREUS NO ANAEROBES ISOLATED Performed at [facility], [place] [address], [place],  [address]    REPTSTATUS 01/20/2021 FINAL 01/14/2021 1835  Other culture-see note  Objective: Vitals: Today's Vitals   03/16/21 0312 03/16/21 0400 03/16/21 0804 03/16/21 1150  BP:  (!) 102/58  131/77  Pulse:  68    Resp:  12    Temp:  98.3 F (36.8 C) 98.4 F (36.9 C) 97.7 F (36.5 C)  TempSrc:  Oral Oral Oral  SpO2: 94% 98%    Weight:      Height:      PainSc:        Intake/Output Summary (Last 24 hours) at 03/16/2021 1154 Last data filed at 03/16/2021 0538 Gross per 24 hour  Intake --  Output 1700 ml  Net -1700 ml   Filed Weights   03/14/21 1838  Weight: 76.7 kg   Weight change:   Intake/Output from previous day: 08/02 0701 - 08/03 0700 In: -  Out: 1700 [Urine:1700] Intake/Output this shift: No intake/output data recorded. Filed Weights   03/14/21 1838  Weight: 76.7 kg    Examination: General exam: AAOx 3, not in acute distress, resting comfortably.  On 2 L nasal cannula HEENT:Oral mucosa moist, Ear/Nose WNL grossly, dentition normal. Respiratory system: bilaterally diminished, no use of accessory muscle Cardiovascular system: S1 & S2 +, No JVD,. Gastrointestinal system: Abdomen soft,NT,ND, BS+ Nervous System:Alert, awake, moving extremities and grossly nonfocal Extremities: no edema, distal peripheral pulses palpable.  Skin: No rashes,no icterus. MSK: Normal muscle bulk,tone, power  Data Reviewed: I have personally reviewed following labs and imaging studies CBC: Recent Labs  Lab 03/14/21 1418 03/15/21 0500 03/16/21 0131  WBC 6.2 6.2 6.1  NEUTROABS  --  3.9  --   HGB 15.0 14.9 15.4*  HCT 47.1* 46.0 46.7*  MCV 91.3 89.8 90.3  PLT 171 171 [id number]   Basic Metabolic Panel: Recent Labs  Lab 03/14/21 1432 03/15/21 0500 03/16/21 0131  NA 130* 131* 133*  K 4.0 4.0 4.0  CL 90* 93* 95*  CO2 32 29 28  GLUCOSE 209* 108* 123*  BUN 8 6* 14  CREATININE 0.85 0.77 0.94  CALCIUM 9.5 9.2 9.1   GFR: Estimated Creatinine Clearance: 33.5 mL/min (by C-G formula based on SCr of 0.94 mg/dL). Liver Function Tests: Recent Labs  Lab 03/14/21 1432 03/16/21 0131  AST 28 27  ALT 15 17  ALKPHOS 58 51  BILITOT 0.6 0.4  PROT 6.8 6.4*  ALBUMIN 3.0* 2.7*   No results for input(s): LIPASE, AMYLASE in the last 168 hours. No results for input(s): AMMONIA in the last 168 hours. Coagulation Profile: No results for input(s): INR, PROTIME in the last 168 hours. Cardiac Enzymes: No results for input(s): CKTOTAL, CKMB, CKMBINDEX, TROPONINI in the last 168 hours. BNP (last 3 results) No results for input(s): PROBNP in the  last 8760 hours. HbA1C: Recent Labs    03/14/21 1918  HGBA1C 6.9*   CBG: Recent Labs  Lab 03/15/21 1156 03/15/21 1748 03/15/21 2247 03/16/21 0804 03/16/21 1152  GLUCAP 119* 147* 127* 108* 90   Lipid Profile: No results for input(s):  CHOL, HDL, LDLCALC, TRIG, CHOLHDL, LDLDIRECT in the last 72 hours. Thyroid Function Tests: No results for input(s): TSH, T4TOTAL, FREET4, T3FREE, THYROIDAB in the last 72 hours. Anemia Panel: No results for input(s): VITAMINB12, FOLATE, FERRITIN, TIBC, IRON, RETICCTPCT in the last 72 hours. Sepsis Labs: No results for input(s): PROCALCITON, LATICACIDVEN in the last 168 hours.  Recent Results (from the past 240 hour(s))  SARS CORONAVIRUS 2 (TAT 6-24 HRS) Nasopharyngeal Nasopharyngeal Swab     Status: Abnormal   Collection Time: 03/14/21  5:43 PM   Specimen: Nasopharyngeal Swab  Result Value Ref Range Status   SARS Coronavirus 2 POSITIVE (A) NEGATIVE Final    Comment: (NOTE) SARS-CoV-2 target nucleic acids are DETECTED.  The SARS-CoV-2 RNA is generally detectable in upper and lower respiratory specimens during the acute phase of infection. Positive results are indicative of the presence of SARS-CoV-2 RNA. Clinical correlation with patient history and other diagnostic information is  necessary to determine patient infection status. Positive results do not rule out bacterial infection or co-infection with other viruses.  The expected result is Negative.  Fact Sheet for Patients: SugarRoll.be  Fact Sheet for Healthcare Providers: https://www.woods-mathews.com/  This test is not yet approved or cleared by the [place] FDA and  has been authorized for detection and/or diagnosis of SARS-CoV-2 by FDA under an Emergency Use Authorization (EUA). This EUA will remain  in effect (meaning this test can be used) for the duration of the COVID-19 declaration under Section 564(b)(1) of the Act, 21 U. S.C. section 360bbb-3(b)(1), unless the authorization is terminated or revoked sooner.   Performed at [facility], [place] [address] [place], [place] [address]     Radiology Studies: DG Chest 2 View  Result Date: 03/14/2021 CLINICAL DATA:   Nonradiating chest pain since last night. EXAM: CHEST - 2 VIEW COMPARISON:  Radiographs 11/02/2019. FINDINGS: The heart size and mediastinal contours are stable with aortic atherosclerosis and evidence of a moderate to large hiatal hernia. There is stable mild scarring at both lung bases. The lungs are otherwise clear. There is no pleural effusion or pneumothorax. There are degenerative changes in the spine status post lumbar spinal augmentation. Previous bilateral shoulder arthroplasty with chronic superior migration of both prostheses. IMPRESSION: Stable chest without evidence of acute process. Moderate to large hiatal hernia. Electronically Signed   By: Richardean Sale M.D.   On: 03/14/2021 15:23     LOS: 1 day   Antonieta Pert, MD [facility]  03/16/2021, 11:54 AM

## 2021-03-16 NOTE — Progress Notes (Signed)
Lower extremity venous has been completed.   Preliminary results in CV Proc.   Abram Sander 03/16/2021 12:05 PM

## 2021-03-17 ENCOUNTER — Telehealth: Payer: Self-pay

## 2021-03-17 LAB — CBC
HCT: 42.6 % (ref 36.0–46.0)
Hemoglobin: 13.9 g/dL (ref 12.0–15.0)
MCH: 29.3 pg (ref 26.0–34.0)
MCHC: 32.6 g/dL (ref 30.0–36.0)
MCV: 89.9 fL (ref 80.0–100.0)
Platelets: 149 10*3/uL — ABNORMAL LOW (ref 150–400)
RBC: 4.74 MIL/uL (ref 3.87–5.11)
RDW: 14.2 % (ref 11.5–15.5)
WBC: 3.9 10*3/uL — ABNORMAL LOW (ref 4.0–10.5)
nRBC: 0 % (ref 0.0–0.2)

## 2021-03-17 LAB — COMPREHENSIVE METABOLIC PANEL
ALT: 12 U/L (ref 0–44)
AST: 22 U/L (ref 15–41)
Albumin: 2.5 g/dL — ABNORMAL LOW (ref 3.5–5.0)
Alkaline Phosphatase: 53 U/L (ref 38–126)
Anion gap: 9 (ref 5–15)
BUN: 19 mg/dL (ref 8–23)
CO2: 28 mmol/L (ref 22–32)
Calcium: 9 mg/dL (ref 8.9–10.3)
Chloride: 88 mmol/L — ABNORMAL LOW (ref 98–111)
Creatinine, Ser: 0.85 mg/dL (ref 0.44–1.00)
GFR, Estimated: >60 mL/min (ref >60)
Glucose, Bld: 138 mg/dL — ABNORMAL HIGH (ref 70–99)
Potassium: 4.4 mmol/L (ref 3.5–5.1)
Sodium: 125 mmol/L — ABNORMAL LOW (ref 135–145)
Total Bilirubin: 0.6 mg/dL (ref 0.3–1.2)
Total Protein: 5.8 g/dL — ABNORMAL LOW (ref 6.5–8.1)

## 2021-03-17 LAB — GLUCOSE, CAPILLARY
Glucose-Capillary: 92 mg/dL (ref 70–99)
Glucose-Capillary: 177 mg/dL — ABNORMAL HIGH (ref 70–99)
Glucose-Capillary: 135 mg/dL — ABNORMAL HIGH (ref 70–99)
Glucose-Capillary: 216 mg/dL — ABNORMAL HIGH (ref 70–99)

## 2021-03-17 MED ORDER — ZINC SULFATE 220 (50 ZN) MG PO CAPS
220.0000 mg | ORAL_CAPSULE | Freq: Every day | ORAL | Status: DC
  Administered 2021-03-17 – 2021-03-23 (×7): 220 mg via ORAL
  Filled 2021-03-17 (×7): qty 1

## 2021-03-17 MED ORDER — ASCORBIC ACID 500 MG PO TABS
500.0000 mg | ORAL_TABLET | Freq: Every day | ORAL | Status: DC
  Administered 2021-03-17 – 2021-03-23 (×7): 500 mg via ORAL
  Filled 2021-03-17 (×7): qty 1

## 2021-03-17 NOTE — Telephone Encounter (Signed)
Patient is currently in the hospital and will follow up with Dr. Juleen China on 8/12.   Patient's daughter made aware of appointment. She will also make sure SNF will be made aware of this information once she receives a bed offer.  Before disconnecting call daughter asks "why is she not getting IV antibiotics since she has a picc line now?"   Advised daughter that IV antibiotics were placed on hold after having issues with Picc line and Dr. [place] Bali advised to continue PO doxycycline and metronidazole until follow up appointment.   Daughter was not appreciative of this advise. She states she will ask attending team once she receives a call from them with report on how her mother is doing.   Routing to MD to also make aware.   Eugenia Mcalpine

## 2021-03-17 NOTE — Plan of Care (Signed)
  Problem: Education: Goal: Knowledge of General Education information will improve Description Including pain rating scale, medication(s)/side effects and non-pharmacologic comfort measures Outcome: Progressing   Problem: Health Behavior/Discharge Planning: Goal: Ability to manage health-related needs will improve Outcome: Progressing   

## 2021-03-17 NOTE — Telephone Encounter (Signed)
-----   Message from Rosiland Oz, MD sent at 03/15/2021  9:33 AM EDT ----- Regarding: Needs follow up  Hi Team,  Patient supposed to see Dr Juleen China today but is in the hospital. I have told primary to keep her on doxy and flagyl for now until seen in the clinic. Would you please  reschedule her Fu with Dr Juleen China in  next 1-2 weeks. Thank you.

## 2021-03-17 NOTE — Progress Notes (Signed)
Physical Therapy Treatment Patient Details Name: Heidi Dominguez MRN: [id number] DOB: [birth year] Today's Date: 03/17/2021    History of Present Illness 87 yo female with onset of persistent cough that has evolved into chest pain was brought to ED, had become unable to walk.  Has new Covid dx, also new L great toe osteomyelitis.  PMHx:  CVA, memory changes, DM, CKD3, pancreatitis, CHF, HTN, osteomyelitis L great toe, OA, back pain    PT Comments    Pt continues to require 2 person assist to stand from bed. Pt with limited knee flexion preventing her from being able to get her feet under her knees making coming to stand very difficult. Asked pt if she used a lift chair at home and she said yes. Will continue to work toward getting her up and back ambulating. Continue to recommend SNF.    Follow Up Recommendations  SNF;Supervision/Assistance - 24 hour     Equipment Recommendations  Wheelchair cushion (measurements PT);Wheelchair (measurements PT)    Recommendations for Other Services       Precautions / Restrictions Precautions Precautions: Fall    Mobility  Bed Mobility Overal bed mobility: Needs Assistance Bed Mobility: Supine to Sit     Supine to sit: Max assist;HOB elevated     General bed mobility comments: Assist to bring legs off of bed, elevate trunk into sitting and bring hips to EOB    Transfers Overall transfer level: Needs assistance Equipment used: Ambulation equipment used Transfers: Sit to/from Stand Sit to Stand: +2 physical assistance;Mod assist;From elevated surface         General transfer comment: Used Stedy from bed. Assist to bring hips up and for balance. Pt's knees with limited flexion so she can not get her feet underneath her knees to power up making it difficult to rise. Used the [place] for bed to chair. Attempted to stand from recliner with RW but unable.  Ambulation/Gait             General Gait Details: unable   Stairs              Wheelchair Mobility    Modified Rankin (Stroke Patients Only)       Balance Overall balance assessment: Needs assistance Sitting-balance support: Bilateral upper extremity supported;Feet supported Sitting balance-Leahy Scale: Poor   Postural control: Left lateral lean Standing balance support: Bilateral upper extremity supported;During functional activity Standing balance-Leahy Scale: Poor Standing balance comment: Stood x 2 with Stedy for 20-30 sec with min assist for static standing                            Cognition Arousal/Alertness: Awake/alert Behavior During Therapy: WFL for tasks assessed/performed Overall Cognitive Status: No family/caregiver present to determine baseline cognitive functioning Area of Impairment: Following commands                       Following Commands: Follows one step commands with increased time;Follows one step commands inconsistently Safety/Judgement: Decreased awareness of safety;Decreased awareness of deficits            Exercises      General Comments General comments (skin integrity, edema, etc.): VSS on RA with SpO2 95%. Left O2 off.      Pertinent Vitals/Pain Pain Assessment: Faces Faces Pain Scale: No hurt    Home Living  Prior Function            PT Goals (current goals can now be found in the care plan section) Progress towards PT goals: Progressing toward goals    Frequency    Min 3X/week      PT Plan Current plan remains appropriate;Frequency needs to be updated    Co-evaluation              AM-PAC PT "6 Clicks" Mobility   Outcome Measure  Help needed turning from your back to your side while in a flat bed without using bedrails?: A Lot Help needed moving from lying on your back to sitting on the side of a flat bed without using bedrails?: A Lot Help needed moving to and from a bed to a chair (including a wheelchair)?: Total Help needed  standing up from a chair using your arms (e.g., wheelchair or bedside chair)?: Total Help needed to walk in hospital room?: Total Help needed climbing 3-5 steps with a railing? : Total 6 Click Score: 8    End of Session Equipment Utilized During Treatment: Gait belt Activity Tolerance: Patient tolerated treatment well Patient left: in chair;with call bell/phone within reach;with chair alarm set Nurse Communication: Mobility status;Need for lift equipment PT Visit Diagnosis: Unsteadiness on feet (R26.81);Muscle weakness (generalized) (M62.81);Difficulty in walking, not elsewhere classified (R26.2)     Time: 1030-1056 PT Time Calculation (min) (ACUTE ONLY): 26 min  Charges:  $Therapeutic Activity: 23-37 mins                     [place] Pager [phone] [place] 03/17/2021, 1:24 PM

## 2021-03-17 NOTE — NC FL2 (Signed)
[place] LEVEL OF CARE SCREENING TOOL     IDENTIFICATION  Patient Name: Heidi Dominguez Birthdate: Feb 27, 1921 Sex: female Admission Date (Current Location): 03/14/2021  [facility] and [place] Number:  Herbalist and Address:  [facility] [place] [place] [address], [place], [place] [address]      Provider Number: [id number]  Attending Physician Name and Address:  Antonieta Pert, MD  Relative Name and Phone Number:  [name], daughter    Current Level of Care: Hospital Recommended Level of Care: [place] Prior Approval Number:    Date Approved/Denied:   PASRR Number: [id number]  Discharge Plan: SNF    Current Diagnoses: Patient Active Problem List   Diagnosis Date Noted   Chest pain 03/14/2021   Costochondritis    Osteomyelitis ([place]) 01/12/2021   Peripheral artery disease ([place]) 12/22/2020   Diabetic foot ulcer ([place]) 09/02/2020   Diabetic neuropathy ([place]) 09/02/2020   Gait abnormality 09/02/2020   Diabetic retinopathy associated with type 2 diabetes mellitus ([place]) 09/02/2020   Hardening of the aorta (main artery of the heart) ([place]) 09/02/2020   Pure hypercholesterolemia 09/02/2020   Thrombocytopenia ([place]) 09/02/2020   Gastroesophageal reflux disease 10/22/2019   Unspecified chronic bronchitis ([place]) 09/02/2019   Infection or inflammatory reaction due to internal joint prosthesis ([place]) 02/20/2019   Rhinitis, chronic 01/07/2019   Infection of shoulder ([place]) 11/08/2018   History of hemiarthroplasty of right shoulder 09/25/2018   UTI (urinary tract infection) 09/12/2018   Acute pancreatitis 08/28/2018   Acute recurrent pancreatitis    Abdominal pain 08/23/2018   Elevated LFTs 07/04/2018   CKD (chronic kidney disease) stage 3, GFR 30-59 ml/min (HCC) 07/04/2018   Upper airway cough syndrome 03/13/2018   Sensorineural hearing loss (SNHL), bilateral 12/04/2016   Post-nasal drainage 03/14/2016   Presbycusis of both ears  03/14/2016   Bilateral hearing loss 10/06/2015   Bilateral impacted cerumen 10/06/2015   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   Subjective tinnitus of both ears 10/06/2015   Choledocholithiasis with chronic cholecystitis, nonobstructing 04/11/2015   Pre-operative cardiovascular examination, high risk surgery    Epigastric abdominal pain 04/08/2015   Acute gallstone pancreatitis s/p lap chole w IOC 04/11/2015 04/08/2015   Diabetes mellitus type 2, controlled ([place]) 04/08/2015   Gallstone pancreatitis 04/08/2015   Mitral regurgitation 04/08/2015   Chronic diastolic CHF (congestive heart failure) ([place]) 04/08/2015   DOE (dyspnea on exertion) 03/31/2015   Ischemic colitis ([place]) 08/09/2012   Colitis 08/07/2012   Rectal bleeding 08/07/2012   Weakness 09/24/2011   Bradycardia 09/24/2011   Acute lower UTI 09/24/2011   Hematochezia 09/24/2011   Essential hypertension 09/24/2011   Dyslipidemia 09/24/2011    Orientation RESPIRATION BLADDER Height & Weight     Self, Time, Situation, Place  O2 (2L Nasal cannula) Incontinent, External catheter Weight: 169 lb (76.7 kg) Height:  '5\' 4"'$  (162.6 cm)  BEHAVIORAL SYMPTOMS/MOOD NEUROLOGICAL BOWEL NUTRITION STATUS      Incontinent Diet (Please see DC Summary)  AMBULATORY STATUS COMMUNICATION OF NEEDS Skin   Extensive Assist Verbally Other (Comment) (Wound on sacrum w/foam dressing)                       Personal Care Assistance Level of Assistance  Bathing, Dressing, Feeding Bathing Assistance: Maximum assistance Feeding assistance: Limited assistance Dressing Assistance: Limited assistance     Functional Limitations Info  Sight, Hearing Sight Info: Impaired Hearing Info: Impaired      SPECIAL CARE  FACTORS FREQUENCY  PT (By licensed PT), OT (By licensed OT)     PT Frequency: 5x/week OT Frequency: 5x/week            Contractures Contractures Info: Not present    Additional Factors Info  Code Status, Allergies,  Isolation Precautions, Insulin Sliding Scale Code Status Info: Full Allergies Info: Amoxicillin-pot Clavulanate, Catapres (Clonidine Hcl), Codeine, Hydrocodone, Lisinopril, Other, Sulfa Antibiotics, Sulfamethoxazole, Verapamil, Sulfasalazine   Insulin Sliding Scale Info: See DC Summary Isolation Precautions Info: COVID+ 03/14/21     Current Medications (03/17/2021):  This is the current hospital active medication list Current Facility-Administered Medications  Medication Dose Route Frequency Provider Last Rate Last Admin   acetaminophen (TYLENOL) tablet 650 mg  650 mg Oral Q8H PRN Wynetta Fines T, MD       albuterol (PROVENTIL) (2.5 MG/3ML) 0.083% nebulizer solution 2.5 mg  2.5 mg Nebulization Q6H PRN Wynetta Fines T, MD       amLODipine (NORVASC) tablet 10 mg  10 mg Oral Daily Wynetta Fines T, MD   10 mg at 03/17/21 [id number]   ascorbic acid (VITAMIN C) tablet 500 mg  500 mg Oral Daily Kc, Maren Beach, MD   500 mg at 03/17/21 [id number]   aspirin EC tablet 81 mg  81 mg Oral Daily Wynetta Fines T, MD   81 mg at 03/17/21 0851   benzonatate (TESSALON) capsule 100 mg  100 mg Oral TID Wynetta Fines T, MD   100 mg at 03/17/21 [id number]   clopidogrel (PLAVIX) tablet 75 mg  75 mg Oral Daily Wynetta Fines T, MD   75 mg at 03/17/21 0851   dexamethasone (DECADRON) injection 4 mg  4 mg Intravenous Q24H Kc, Maren Beach, MD   4 mg at 03/16/21 1236   doxycycline (VIBRA-TABS) tablet 100 mg  100 mg Oral Q12H Kc, Ramesh, MD   100 mg at 03/17/21 0851   enoxaparin (LOVENOX) injection 40 mg  40 mg Subcutaneous Q24H Kc, Ramesh, MD   40 mg at 03/16/21 2109   famotidine (PEPCID) tablet 10 mg  10 mg Oral BID Wynetta Fines T, MD   10 mg at 03/17/21 [id number]   ferrous sulfate tablet 325 mg  325 mg Oral QHS Wynetta Fines T, MD   325 mg at 03/16/21 2110   furosemide (LASIX) tablet 20 mg  20 mg Oral Daily Wynetta Fines T, MD   20 mg at 03/17/21 0851   guaiFENesin (MUCINEX) 12 hr tablet 1,200 mg  1,200 mg Oral BID Wynetta Fines T, MD   1,200 mg at 03/17/21 0851   insulin  aspart (novoLOG) injection 0-15 Units  0-15 Units Subcutaneous TID WC Wynetta Fines T, MD   11 Units at 03/16/21 1754   lidocaine (LIDODERM) 5 % 1 patch  1 patch Transdermal Q24H Wynetta Fines T, MD   1 patch at 03/16/21 2108   lidocaine (XYLOCAINE) 5 % ointment   Topical Daily PRN Wynetta Fines T, MD       linagliptin (TRADJENTA) tablet 5 mg  5 mg Oral Daily Wynetta Fines T, MD   5 mg at 03/17/21 0853   loratadine (CLARITIN) tablet 10 mg  10 mg Oral Daily Wynetta Fines T, MD   10 mg at 03/17/21 0851   metroNIDAZOLE (FLAGYL) tablet 500 mg  500 mg Oral Q12H Kc, Maren Beach, MD   500 mg at 03/17/21 0851   ondansetron (ZOFRAN) tablet 4 mg  4 mg Oral Q8H PRN Lequita Halt, MD   4 mg at 03/16/21 0119  pantoprazole (PROTONIX) EC tablet 40 mg  40 mg Oral Daily Wynetta Fines T, MD   40 mg at 03/17/21 0851   polyvinyl alcohol (LIQUIFILM TEARS) 1.4 % ophthalmic solution 1 drop  1 drop Both Eyes Daily PRN Wynetta Fines T, MD   1 drop at 03/16/21 2110   remdesivir 100 mg in sodium chloride 0.9 % 100 mL IVPB  100 mg Intravenous Daily Kc, Maren Beach, MD 200 mL/hr at 03/17/21 0921 100 mg at 03/17/21 0921   [START ON 03/20/2021] vitamin B-12 (CYANOCOBALAMIN) tablet 1,000 mcg  1,000 mcg Oral Q Kathaleen Grinder, Pearletha Forge T, MD       zinc sulfate capsule 220 mg  220 mg Oral Daily Antonieta Pert, MD   220 mg at 03/17/21 [id number]     Discharge Medications: Please see discharge summary for a list of discharge medications.  Relevant Imaging Results:  Relevant Lab Results:   Additional Information SS#: [id number]. [place] COVID-19 Vaccine 09/14/2020 , 10/29/2019 , 10/05/2019 , 09/24/2019  Benard Halsted, LCSW

## 2021-03-17 NOTE — Progress Notes (Signed)
PROGRESS NOTE    Heidi Dominguez  [id number] DOB: 14-Feb-1921 DOA: 03/14/2021 PCP: Lajean Manes, MD   Chief Complaint  Patient presents with   Chest Pain    Brief Narrative: 87 year old female with history of IDDM, chronic diastolic congestive heart failure, hypertension, CKD stage III, recent left big toe osteomyelitis on IV antibiotics 3 wks ago but IV line got infiltrated 4 dasy PTA so antibiotics were on hold, PVD status post left tibial artery angioplasty and stenting in 2022 May presents with persistent cough, chest pain. Patient recently diagnosed with COVID last week by home care nurse, starting last Friday having dry cough chest pain on the left side worsened with cough and movement no shortness of breath no GI symptoms. In the ED, troponin negative x1 EKG no acute ST changes, COVID-19-tested positive. Chest x-ray no acute process moderate to large hiatal hernia. Patient was admitted for further management of cough and chest pain CALLED DAUGHTER 8/3- Per daughter she got sick Friday, after caregiver visited wednesday, home test was negative, pcr was negative on Friday too.Again was sick Sunday and brought to ED  Subjective: Seen and examined this morning.  Resting comfortably.  No new complaints.  Does complain of some cough Sodium has decreased overnight on  0.45 saline and is stopped BP was soft yesterday evening  Assessment & Plan:  Chest pain Persistent cough COVID-19 positive Acute hypoxic respiratory failure pulse ox 86% room air 8/3 am Chest pain appears noncardiac in the setting of COVID-19 infection.Chest xray stable.Troponin has been negative x3,BNP normal in the ED.Inflammatory markers CRP 1.9-0/9.  Overnight hypoxia 8/3 at 86% RA- placed on 2l [place], Decadron. Cont remdesivir, supportive measures-vitamin C, zinc, cont airborne precautions. D-dimer has been elevated underwent CT angio chest 8/3- no PE.  Recent Labs    03/14/21 1938 03/15/21 1450 03/16/21 0836   DDIMER  --  3.98* 3.39*  CRP 1.9*  --  0.9   Left big toe ulcer with osteomyelitis had completed 3 weeks of antibiotics before IV line got infiltrated 4 days PTA.  Wound care consulted, discussed with ID Dr. Mariana Arn to keep PO Doxy and metronidazole for now and ID F/U.  IDDM with hyperglycemia : Blood sugar did go up yesterday evening  likley form steroid, stable today, continue sliding scale insulin,  Januvia Tradjenta and sliding scale insulin.  Hemoglobin A1c stable 6.9 Recent Labs  Lab 03/16/21 0804 03/16/21 1152 03/16/21 1720 03/16/21 2122 03/17/21 0740  GLUCAP 108* 90 320* 210* 92   CKD stage IIIa-renal function stable at baseline, CT angio for which patient was hydrated, stop fluids due to hyponatremia. Encourage po.bmp in am. Recent Labs    12/22/20 1211 12/22/20 2020 01/12/21 0000 02/09/21 1641 03/14/21 1432 03/15/21 0500 03/16/21 0131 03/17/21 0105  BUN 16  --  '14 10 8 '$ 6* 14 19  CREATININE 0.90   < > 1.17* 0.81 0.85 0.77 0.94 0.85   < > = values in this interval not displayed.   Hyponatremia: Sodium dropped to 125 overnight. Allo po, stop 0.45 ns.bmp in am Recent Labs  Lab 03/14/21 1432 03/15/21 0500 03/16/21 0131 03/17/21 0105  NA 130* 131* 133* 125*    Hypertension: BP soft last night- hold losartan.Cont  amlodipine and monitor.  PVD with left renal artery angioplasty and stenting in May 2022:Continue on her home aspirin Plavix and statin.   Leukopenia/thrombocytopenia ,mild :and likely from COVID infection.  Monitor counts  Acute on chronic ambulatory dysfunction/deconditioning:continue PT eval, CIR vs SNF.  [id number] comorbidities, elderly, will benefit with palliative care evaluation while hospitalized. Palliative care has reached out to the daughter 8/3 who wants full scope of treatment  Diet Order             Diet heart healthy/carb modified Room service appropriate? Yes; Fluid consistency: Thin  Diet effective now                  Patient's Body mass index is 29.01 kg/m. DVT prophylaxis: enoxaparin (LOVENOX) injection 40 mg Start: 03/15/21 1930 Code Status:   Code Status: Full Code  Family Communication: plan of care discussed with patient at bedside. 03/16/21- iI discussed plan of care with patient's daughter as well as niece who is a physician discussed about different test being ordered.  Status is: Remains inpatient for ongoing management of COVID-19 infection/deconditioning  Dispo: The patient is from: Home              Anticipated d/c is to: SNF              Patient currently is not medically stable to d/c.   Difficult to place patient No Unresulted Labs (From admission, onward)     Start     Ordered   03/16/21 0500  Comprehensive metabolic panel  Daily,   R      03/15/21 1406   03/16/21 0500  CBC  Daily,   R      03/15/21 1406           Medications reviewed:  Scheduled Meds:  amLODipine  10 mg Oral Daily   vitamin C  500 mg Oral Daily   aspirin EC  81 mg Oral Daily   benzonatate  100 mg Oral TID   clopidogrel  75 mg Oral Daily   dexamethasone (DECADRON) injection  4 mg Intravenous Q24H   doxycycline  100 mg Oral Q12H   enoxaparin (LOVENOX) injection  40 mg Subcutaneous Q24H   famotidine  10 mg Oral BID   ferrous sulfate  325 mg Oral QHS   furosemide  20 mg Oral Daily   guaiFENesin  1,200 mg Oral BID   insulin aspart  0-15 Units Subcutaneous TID WC   lidocaine  1 patch Transdermal Q24H   linagliptin  5 mg Oral Daily   loratadine  10 mg Oral Daily   metroNIDAZOLE  500 mg Oral Q12H   pantoprazole  40 mg Oral Daily   [START ON 03/20/2021] vitamin B-12  1,000 mcg Oral Q Sun   zinc sulfate  220 mg Oral Daily   Continuous Infusions:  remdesivir 100 mg in NS 100 mL 100 mg (03/17/21 0921)   Consultants:see note  Procedures:see note Antimicrobials: Anti-infectives (From admission, onward)    Start     Dose/Rate Route Frequency Ordered Stop   03/16/21 1000  remdesivir 100 mg in sodium  chloride 0.9 % 100 mL IVPB       See Hyperspace for full Linked Orders Report.   100 mg 200 mL/hr over 30 Minutes Intravenous Daily 03/15/21 1429 03/20/21 0959   03/15/21 1600  remdesivir 200 mg in sodium chloride 0.9% 250 mL IVPB       See Hyperspace for full Linked Orders Report.   200 mg 580 mL/hr over 30 Minutes Intravenous Once 03/15/21 1429 03/15/21 1816   03/15/21 1445  metroNIDAZOLE (FLAGYL) tablet 500 mg        500 mg Oral Every 12 hours 03/15/21 1435     03/15/21 1415  doxycycline (  VIBRA-TABS) tablet 100 mg        100 mg Oral Every 12 hours 03/15/21 1406     03/15/21 1415  metroNIDAZOLE (FLAGYL) tablet 500 mg  Status:  Discontinued        500 mg Oral Every 8 hours 03/15/21 1406 03/15/21 1432      Culture/Microbiology    Component Value Date/Time   SDES  01/14/2021 1835    TOE LEFT Performed at [facility], [place] [address] [place], [place] [address]    SPECREQUEST  01/14/2021 1835    NONE Performed at [facility], [place] [address] [place], [place] [address]    CULT  01/14/2021 1835    RARE METHICILLIN RESISTANT STAPHYLOCOCCUS AUREUS NO ANAEROBES ISOLATED Performed at [facility], [place] [address] [place], [place] [address]    REPTSTATUS 01/20/2021 FINAL 01/14/2021 1835  Other culture-see note  Objective: Vitals: Today's Vitals   03/16/21 1940 03/17/21 0000 03/17/21 0412 03/17/21 0737  BP: (!) 89/51 106/63 (!) 119/91 (!) 158/62  Pulse: 80 68 73   Resp: '16 20 13   '$ Temp: 98.7 F (37.1 C) 97.8 F (36.6 C) 98.7 F (37.1 C) 97.7 F (36.5 C)  TempSrc: Oral Axillary Oral Oral  SpO2: 100% 100% 100%   Weight:      Height:      PainSc: 0-No pain       Intake/Output Summary (Last 24 hours) at 03/17/2021 1118 Last data filed at 03/17/2021 1100 Gross per 24 hour  Intake 1769.6 ml  Output 2550 ml  Net -780.4 ml   Filed Weights   03/14/21 1838  Weight: 76.7 kg   Weight change:   Intake/Output from previous  day: 08/03 0701 - 08/04 0700 In: 1769.6 [P.O.:720; I.V.:949.4; IV Piggyback:100.3] Out: 1350 [Urine:1350] Intake/Output this shift: Total I/O In: -  Out: 1200 [Urine:1200] Filed Weights   03/14/21 1838  Weight: 76.7 kg   Examination: General exam: AAO, pleasant, not in acute distress weak and frail HEENT:Oral mucosa moist, Ear/Nose WNL grossly, dentition normal. Respiratory system: bilaterally diminished,  no use of accessory muscle Cardiovascular system: S1 & S2 +, No JVD,. Gastrointestinal system: Abdomen soft, NT,ND, BS+ Nervous System:Alert, awake, moving extremities and grossly nonfocal Extremities: no edema, distal peripheral pulses palpable.  Skin: No rashes,no icterus. MSK: Normal muscle bulk,tone, power  Data Reviewed: I have personally reviewed following labs and imaging studies CBC: Recent Labs  Lab 03/14/21 1418 03/15/21 0500 03/16/21 0131 03/17/21 0105  WBC 6.2 6.2 6.1 3.9*  NEUTROABS  --  3.9  --   --   HGB 15.0 14.9 15.4* 13.9  HCT 47.1* 46.0 46.7* 42.6  MCV 91.3 89.8 90.3 89.9  PLT 171 171 171 [id number]*   Basic Metabolic Panel: Recent Labs  Lab 03/14/21 1432 03/15/21 0500 03/16/21 0131 03/17/21 0105  NA 130* 131* 133* 125*  K 4.0 4.0 4.0 4.4  CL 90* 93* 95* 88*  CO2 32 '29 28 28  '$ GLUCOSE 209* 108* 123* 138*  BUN 8 6* 14 19  CREATININE 0.85 0.77 0.94 0.85  CALCIUM 9.5 9.2 9.1 9.0   GFR: Estimated Creatinine Clearance: 37 mL/min (by C-G formula based on SCr of 0.85 mg/dL). Liver Function Tests: Recent Labs  Lab 03/14/21 1432 03/16/21 0131 03/17/21 0105  AST '28 27 22  '$ ALT '15 17 12  '$ ALKPHOS 58 51 53  BILITOT 0.6 0.4 0.6  PROT 6.8 6.4* 5.8*  ALBUMIN 3.0* 2.7* 2.5*   No results for input(s):  LIPASE, AMYLASE in the last 168 hours. No results for input(s): AMMONIA in the last 168 hours. Coagulation Profile: No results for input(s): INR, PROTIME in the last 168 hours. Cardiac Enzymes: No results for input(s): CKTOTAL, CKMB, CKMBINDEX,  TROPONINI in the last 168 hours. BNP (last 3 results) No results for input(s): PROBNP in the last 8760 hours. HbA1C: Recent Labs    03/14/21 1918  HGBA1C 6.9*   CBG: Recent Labs  Lab 03/16/21 0804 03/16/21 1152 03/16/21 1720 03/16/21 2122 03/17/21 0740  GLUCAP 108* 90 320* 210* 92   Lipid Profile: No results for input(s): CHOL, HDL, LDLCALC, TRIG, CHOLHDL, LDLDIRECT in the last 72 hours. Thyroid Function Tests: No results for input(s): TSH, T4TOTAL, FREET4, T3FREE, THYROIDAB in the last 72 hours. Anemia Panel: No results for input(s): VITAMINB12, FOLATE, FERRITIN, TIBC, IRON, RETICCTPCT in the last 72 hours. Sepsis Labs: No results for input(s): PROCALCITON, LATICACIDVEN in the last 168 hours.  Recent Results (from the past 240 hour(s))  SARS CORONAVIRUS 2 (TAT 6-24 HRS) Nasopharyngeal Nasopharyngeal Swab     Status: Abnormal   Collection Time: 03/14/21  5:43 PM   Specimen: Nasopharyngeal Swab  Result Value Ref Range Status   SARS Coronavirus 2 POSITIVE (A) NEGATIVE Final    Comment: (NOTE) SARS-CoV-2 target nucleic acids are DETECTED.  The SARS-CoV-2 RNA is generally detectable in upper and lower respiratory specimens during the acute phase of infection. Positive results are indicative of the presence of SARS-CoV-2 RNA. Clinical correlation with patient history and other diagnostic information is  necessary to determine patient infection status. Positive results do not rule out bacterial infection or co-infection with other viruses.  The expected result is Negative.  Fact Sheet for Patients: SugarRoll.be  Fact Sheet for Healthcare Providers: https://www.woods-mathews.com/  This test is not yet approved or cleared by the [place] FDA and  has been authorized for detection and/or diagnosis of SARS-CoV-2 by FDA under an Emergency Use Authorization (EUA). This EUA will remain  in effect (meaning this test can be used) for  the duration of the COVID-19 declaration under Section 564(b)(1) of the Act, 21 U. S.C. section 360bbb-3(b)(1), unless the authorization is terminated or revoked sooner.   Performed at [facility], [place] [address], [place], [place] [address]     Radiology Studies: CT Angio Chest Pulmonary Embolism (PE) W or WO Contrast  Result Date: 03/16/2021 CLINICAL DATA:  Chest pain. Recent COVID infection. Shortness of breath. EXAM: CT ANGIOGRAPHY CHEST WITH CONTRAST TECHNIQUE: Multidetector CT imaging of the chest was performed using the standard protocol during bolus administration of intravenous contrast. Multiplanar CT image reconstructions and MIPs were obtained to evaluate the vascular anatomy. CONTRAST:  120m OMNIPAQUE IOHEXOL 350 MG/ML SOLN COMPARISON:  Chest radiograph 03/14/2021 FINDINGS: Cardiovascular: Coronary, aortic arch, and branch vessel atherosclerotic vascular disease. Mild cardiomegaly. Substantial streak artifact related to arm positioning, and bilateral humeral implants. Relative lucencies in upper lobe pulmonary venous and pulmonary arterial structures on the axial images is not well reflected on multiplanar reconstructed images, and based on combination of motion and streak artifact is thought to be artifactual. No definite pulmonary embolus is identified. Sensitivity for small emboli is reduced due to motion and extensive streak artifact. Mediastinum/Nodes: Hypodense right thyroid nodule 4.0 by 3.5 cm on image 13 series 5, roughly similar on CT cervical spine 01/13/2018. In the setting of significant comorbidities or limited life expectancy, no follow-up recommended (ref: J Am Coll Radiol. 2015 Feb;12(2): 143-50). Large hiatal hernia containing stomach. Lungs/Pleura:  Bandlike scarring at the lung apices, right greater than left. Mild dependent atelectasis in both lungs. Atelectasis medially in the left lower lobe adjacent to the hiatal hernia. Upper Abdomen: Stable biliary dilatation.  Musculoskeletal: Thoracic spondylosis. The right humeral head appears subluxed medially and cephalad with respect to the glenoid. Review of the MIP images confirms the above findings. IMPRESSION: 1. No embolus identified. Reduced sensitivity due to motion artifact and streak artifact from implants and arm positioning. 2. Stable 4 cm right thyroid nodule. In the setting of significant comorbidities or limited life expectancy, no follow-up recommended (ref: J Am Coll Radiol. 2015 Feb;12(2): 143-50). 3. Large hiatal hernia with adjacent atelectasis. Mild dependent atelectasis in both lungs. 4. Bandlike scarring at the lung apices. 5. Stable biliary dilatation in the upper abdomen compared to CT abdomen from 08/23/2018. 6. Bilateral humeral implants with the right humeral implant subluxed with respect to the glenoid. Electronically Signed   By: Van Clines M.D.   On: 03/16/2021 14:35   VAS [place] LOWER EXTREMITY VENOUS (DVT)  Result Date: 03/16/2021  Lower Venous DVT Study Patient Name:  OLGA BAPST  Date of Exam:   03/16/2021 Medical Rec #: [id number]        Accession #:    [id number] Date of Birth: 06-05-1921        Patient Gender: F Patient Age:   89Y Exam Location:  [facility] Procedure:      VAS [place] LOWER EXTREMITY VENOUS (DVT) Referring Phys: [id number] [place] --------------------------------------------------------------------------------  Indications: Swelling, and Edema.  Comparison Study: no prior Performing Technologist: Archie Patten RVS  Examination Guidelines: A complete evaluation includes B-mode imaging, spectral Doppler, color Doppler, and power Doppler as needed of all accessible portions of each vessel. Bilateral testing is considered an integral part of a complete examination. Limited examinations for reoccurring indications may be performed as noted. The reflux portion of the exam is performed with the patient in reverse Trendelenburg.   +---------+---------------+---------+-----------+----------+-------------------+ RIGHT    CompressibilityPhasicitySpontaneityPropertiesThrombus Aging      +---------+---------------+---------+-----------+----------+-------------------+ CFV      Full           Yes      Yes                                      +---------+---------------+---------+-----------+----------+-------------------+ SFJ      Full                                                             +---------+---------------+---------+-----------+----------+-------------------+ FV Prox  Full                                                             +---------+---------------+---------+-----------+----------+-------------------+ FV Mid   Full                                                             +---------+---------------+---------+-----------+----------+-------------------+  FV DistalFull                                                             +---------+---------------+---------+-----------+----------+-------------------+ PFV      Full                                                             +---------+---------------+---------+-----------+----------+-------------------+ POP      Full           Yes      Yes                                      +---------+---------------+---------+-----------+----------+-------------------+ PTV      Full                                                             +---------+---------------+---------+-----------+----------+-------------------+ PERO                                                  Not well visualized +---------+---------------+---------+-----------+----------+-------------------+   +---------+---------------+---------+-----------+----------+-------------------+ LEFT     CompressibilityPhasicitySpontaneityPropertiesThrombus Aging      +---------+---------------+---------+-----------+----------+-------------------+  CFV      Full           Yes      Yes                                      +---------+---------------+---------+-----------+----------+-------------------+ SFJ      Full                                                             +---------+---------------+---------+-----------+----------+-------------------+ FV Prox  Full                                                             +---------+---------------+---------+-----------+----------+-------------------+ FV Mid   Full                                                             +---------+---------------+---------+-----------+----------+-------------------+ FV DistalFull                                                             +---------+---------------+---------+-----------+----------+-------------------+  PFV      Full                                                             +---------+---------------+---------+-----------+----------+-------------------+ POP      Full           Yes      Yes                                      +---------+---------------+---------+-----------+----------+-------------------+ PTV      Full                                                             +---------+---------------+---------+-----------+----------+-------------------+ PERO                                                  Not well visualized +---------+---------------+---------+-----------+----------+-------------------+     Summary: BILATERAL: - No evidence of deep vein thrombosis seen in the lower extremities, bilaterally. -No evidence of popliteal cyst, bilaterally.   *See table(s) above for measurements and observations. Electronically signed by Harold Barban MD on 03/16/2021 at 4:30:06 PM.    Final      LOS: 2 days   Antonieta Pert, MD [facility]  03/17/2021, 11:18 AM

## 2021-03-17 NOTE — TOC Progression Note (Signed)
Transition of Care [facility]) - Progression Note    Patient Details  Name: Heidi Dominguez MRN: [id number] Date of Birth: October 31, 1920  Transition of [facility]) CM/SW [place], LCSW Phone Number: 03/17/2021, 9:44 AM  Clinical Narrative:    CSW spoke with patient's daughter and let her know that [facility] no longer accepting COVID patients and that [facility] only option if they can accept patient at time of discharge. She requested CSW ask for another COVID test to see if patient is negative so she can have more SNF bed choices. CSW made MD aware.    Expected Discharge Plan: [place] Barriers to Discharge: Continued Medical Work up, SNF Covid  Expected Discharge Plan and Services Expected Discharge Plan: [place] In-house Referral: Clinical Social Work     Living arrangements for the past 2 months: Single Family Home                                       Social Determinants of Health (SDOH) Interventions    Readmission Risk Interventions No flowsheet data found.

## 2021-03-18 LAB — COMPREHENSIVE METABOLIC PANEL
ALT: 12 U/L (ref 0–44)
AST: 20 U/L (ref 15–41)
Albumin: 2.6 g/dL — ABNORMAL LOW (ref 3.5–5.0)
Alkaline Phosphatase: 51 U/L (ref 38–126)
Anion gap: 8 (ref 5–15)
BUN: 18 mg/dL (ref 8–23)
CO2: 28 mmol/L (ref 22–32)
Calcium: 9 mg/dL (ref 8.9–10.3)
Chloride: 91 mmol/L — ABNORMAL LOW (ref 98–111)
Creatinine, Ser: 0.78 mg/dL (ref 0.44–1.00)
GFR, Estimated: >60 mL/min (ref >60)
Glucose, Bld: 163 mg/dL — ABNORMAL HIGH (ref 70–99)
Potassium: 4.5 mmol/L (ref 3.5–5.1)
Sodium: 127 mmol/L — ABNORMAL LOW (ref 135–145)
Total Bilirubin: 0.8 mg/dL (ref 0.3–1.2)
Total Protein: 6.3 g/dL — ABNORMAL LOW (ref 6.5–8.1)

## 2021-03-18 LAB — GLUCOSE, CAPILLARY
Glucose-Capillary: 93 mg/dL (ref 70–99)
Glucose-Capillary: 129 mg/dL — ABNORMAL HIGH (ref 70–99)
Glucose-Capillary: 195 mg/dL — ABNORMAL HIGH (ref 70–99)
Glucose-Capillary: 259 mg/dL — ABNORMAL HIGH (ref 70–99)

## 2021-03-18 LAB — CBC
HCT: 42.9 % (ref 36.0–46.0)
Hemoglobin: 14.3 g/dL (ref 12.0–15.0)
MCH: 29.2 pg (ref 26.0–34.0)
MCHC: 33.3 g/dL (ref 30.0–36.0)
MCV: 87.6 fL (ref 80.0–100.0)
Platelets: 158 10*3/uL (ref 150–400)
RBC: 4.9 MIL/uL (ref 3.87–5.11)
RDW: 14 % (ref 11.5–15.5)
WBC: 4.6 10*3/uL (ref 4.0–10.5)
nRBC: 0 % (ref 0.0–0.2)

## 2021-03-18 MED ORDER — FLORANEX PO PACK
1.0000 g | PACK | Freq: Three times a day (TID) | ORAL | Status: DC
  Administered 2021-03-18 – 2021-03-23 (×18): 1 g via ORAL
  Filled 2021-03-18 (×19): qty 1

## 2021-03-18 NOTE — Plan of Care (Signed)
  Problem: Education: Goal: Knowledge of General Education information will improve Description Including pain rating scale, medication(s)/side effects and non-pharmacologic comfort measures Outcome: Progressing   Problem: Health Behavior/Discharge Planning: Goal: Ability to manage health-related needs will improve Outcome: Progressing   

## 2021-03-18 NOTE — Progress Notes (Signed)
PROGRESS NOTE    Heidi Dominguez  [id number] DOB: [birth year] DOA: 03/14/2021 PCP: Lajean Manes, MD   Chief Complaint  Patient presents with   Chest Pain    Brief Narrative: 87 year old female with history of IDDM, chronic diastolic congestive heart failure, hypertension, CKD stage III, recent left big toe osteomyelitis on IV antibiotics 3 wks ago but IV line got infiltrated 4 dasy PTA so antibiotics were on hold, PVD status post left tibial artery angioplasty and stenting in 2022 May presents with persistent cough, chest pain. Patient recently diagnosed with COVID last week by home care nurse, starting last Friday having dry cough chest pain on the left side worsened with cough and movement no shortness of breath no GI symptoms. In the ED, troponin negative x1 EKG no acute ST changes, COVID-19-tested positive. Chest x-ray no acute process moderate to large hiatal hernia. Patient was admitted for further management of cough and chest pain CALLED DAUGHTER 8/3- Per daughter she got sick Friday, after caregiver visited wednesday, home test was negative, pcr was negative on Friday too.Again was sick Sunday and brought to ED  Subjective: The patient was seen and examined this morning, remained stable no acute distress, satting 97% on room air   Assessment & Plan:  Chest pain -resolved Persistent cough -improving with medication COVID-19 positive/ Acute hypoxic respiratory failure pulse ox 86% room air 8/3 am -Much improved, on room air, satting 97% this morning 03/18/2021  Chest pain appears noncardiac in the setting of COVID-19 infection.Chest xray stable.Troponin has been negative x3,BNP normal in the ED.Inflammatory markers CRP 1.9-0/9.   Was hypoxia 8/3 at 86% RA- placed on 2l [place], Decadron. Cont remdesivir, supportive measures-vitamin C, zinc, cont airborne precautions. D-dimer has been elevated underwent CT angio chest 8/3- no PE -Respiratory status is improved, has been weaned off 2  L of oxygen to room air currently satting 97% -Continue above treatment -Follow inflammatory markers CRP normal at 0.9    Left big toe ulcer with osteomyelitis had completed 3 weeks of antibiotics before IV line got infiltrated 4 days PTA.  Wound care consulted, discussed with ID Dr. Mariana Arn to keep PO Doxy and metronidazole for now and ID F/U.  IDDM with hyperglycemia : Blood sugar did go up yesterday evening  likley form steroid, stable today, continue sliding scale insulin,  On Januvia Tradjenta.   Hemoglobin A1c stable 6.9 Improved, -Checking CBG q. ACH S, SSI coverage Recent Labs  Lab 03/17/21 0740 03/17/21 1210 03/17/21 1725 03/17/21 2131 03/18/21 0808  GLUCAP 92 177* 135* 216* 93   CKD stage IIIa-renal function stable at baseline, CT angio for which patient was hydrated, stop fluids due to hyponatremia.  Cardizem p.o. hydration Recent Labs    12/22/20 1211 12/22/20 2020 01/12/21 0000 02/09/21 1641 03/14/21 1432 03/15/21 0500 03/16/21 0131 03/17/21 0105 03/18/21 0156  BUN 16  --  '14 10 8 '$ 6* '14 19 18  '$ CREATININE 0.90   < > 1.17* 0.81 0.85 0.77 0.94 0.85 0.78   < > = values in this interval not displayed.   Hyponatremia: Sodium dropped to 125 overnight. Allo po, stop 0.45 ns.bmp in am Monitoring sodium level improving to 127 today Recent Labs  Lab 03/14/21 1432 03/15/21 0500 03/16/21 0131 03/17/21 0105 03/18/21 0156  NA 130* 131* 133* 125* 127*    Hypertension: BP soft last night- hold losartan.Cont  amlodipine and monitor.  PVD with left renal artery angioplasty and stenting in May 2022: Continue on her home aspirin Plavix and  statin.   Leukopenia/thrombocytopenia ,mild :and likely from COVID infection.  Monitor counts  Acute on chronic ambulatory dysfunction/deconditioning: continue PT eval, CIR vs SNF.  [id number] comorbidities, elderly, will benefit with palliative care evaluation while hospitalized. Palliative care has reached out to the  daughter 8/3 who wants full scope of treatment  Diet Order             Diet heart healthy/carb modified Room service appropriate? Yes; Fluid consistency: Thin  Diet effective now                 Patient's Body mass index is 29.01 kg/m. DVT prophylaxis: enoxaparin (LOVENOX) injection 40 mg Start: 03/15/21 1930 Code Status:   Code Status: Full Code  Family Communication: plan of care discussed with patient at bedside and her daughter, along with her niece who is a physician on 03/18/2021   Status is: Remains inpatient for ongoing management of COVID-19 infection/deconditioning  Dispo: The patient is from: Home              Anticipated d/c is to: SNF in 1-days               Patient currently is not medically stable to d/c.   Difficult to place patient No Unresulted Labs (From admission, onward)    None      Medications reviewed:  Scheduled Meds:  amLODipine  10 mg Oral Daily   vitamin C  500 mg Oral Daily   aspirin EC  81 mg Oral Daily   benzonatate  100 mg Oral TID   clopidogrel  75 mg Oral Daily   dexamethasone (DECADRON) injection  4 mg Intravenous Q24H   doxycycline  100 mg Oral Q12H   enoxaparin (LOVENOX) injection  40 mg Subcutaneous Q24H   famotidine  10 mg Oral BID   ferrous sulfate  325 mg Oral QHS   furosemide  20 mg Oral Daily   guaiFENesin  1,200 mg Oral BID   insulin aspart  0-15 Units Subcutaneous TID WC   lactobacillus  1 g Oral TID WC   lidocaine  1 patch Transdermal Q24H   linagliptin  5 mg Oral Daily   loratadine  10 mg Oral Daily   metroNIDAZOLE  500 mg Oral Q12H   pantoprazole  40 mg Oral Daily   [START ON 03/20/2021] vitamin B-12  1,000 mcg Oral Q Sun   zinc sulfate  220 mg Oral Daily   Continuous Infusions:  remdesivir 100 mg in NS 100 mL 100 mg (03/17/21 0921)   Consultants:see note  Procedures:see note Antimicrobials: Anti-infectives (From admission, onward)    Start     Dose/Rate Route Frequency Ordered Stop   03/16/21 1000  remdesivir  100 mg in sodium chloride 0.9 % 100 mL IVPB       See Hyperspace for full Linked Orders Report.   100 mg 200 mL/hr over 30 Minutes Intravenous Daily 03/15/21 1429 03/20/21 0959   03/15/21 1600  remdesivir 200 mg in sodium chloride 0.9% 250 mL IVPB       See Hyperspace for full Linked Orders Report.   200 mg 580 mL/hr over 30 Minutes Intravenous Once 03/15/21 1429 03/15/21 1816   03/15/21 1445  metroNIDAZOLE (FLAGYL) tablet 500 mg        500 mg Oral Every 12 hours 03/15/21 1435     03/15/21 1415  doxycycline (VIBRA-TABS) tablet 100 mg        100 mg Oral Every 12 hours 03/15/21 1406  03/15/21 1415  metroNIDAZOLE (FLAGYL) tablet 500 mg  Status:  Discontinued        500 mg Oral Every 8 hours 03/15/21 1406 03/15/21 1432      Culture/Microbiology    Component Value Date/Time   SDES  01/14/2021 1835    TOE LEFT Performed at [facility], [place] [address], [place], [place] [address]    SPECREQUEST  01/14/2021 1835    NONE Performed at [facility], [place] [address] [place], [place] [address]    CULT  01/14/2021 1835    RARE METHICILLIN RESISTANT STAPHYLOCOCCUS AUREUS NO ANAEROBES ISOLATED Performed at [facility], [place] [address], [place], [place] [address]    REPTSTATUS 01/20/2021 FINAL 01/14/2021 1835  Other culture-see note  Objective: Vitals: Today's Vitals   03/18/21 0113 03/18/21 0418 03/18/21 0438 03/18/21 0804  BP: (!) 152/74  136/74 138/70  Pulse: 72  68   Resp: 14  16   Temp: 98 F (36.7 C)  97.8 F (36.6 C) 98.4 F (36.9 C)  TempSrc: Oral  Oral Oral  SpO2: 97%  97%   Weight:      Height:      PainSc:  0-No pain      Intake/Output Summary (Last 24 hours) at 03/18/2021 1124 Last data filed at 03/18/2021 0439 Gross per 24 hour  Intake 100 ml  Output 1700 ml  Net -1600 ml   Filed Weights   03/14/21 1838  Weight: 76.7 kg   Weight change:   Intake/Output from previous day: 08/04 0701 - 08/05 0700 In: 100 [IV  Piggyback:100] Out: 2900 [Urine:2900] Intake/Output this shift: No intake/output data recorded. Filed Weights   03/14/21 1838  Weight: 76.7 kg     Physical Exam:   General:  Alert, oriented, cooperative, no distress;   HEENT:  Normocephalic, PERRL, otherwise with in Normal limits   Neuro:  CNII-XII intact. , normal motor and sensation, reflexes intact   Lungs:   Clear to auscultation BL, Respirations unlabored, no wheezes / crackles  Cardio:    S1/S2, RRR, No murmure, No Rubs or Gallops   Abdomen:   Soft, non-tender, bowel sounds active all four quadrants,  no guarding or peritoneal signs.  Muscular skeletal:  Limited exam - in bed, able to move all 4 extremities, Normal strength,  2+ pulses,  symmetric, No pitting edema  Skin:  Dry, warm to touch, negative for any Rashes,  Wounds: Please see nursing documentation      Data Reviewed: I have personally reviewed following labs and imaging studies CBC: Recent Labs  Lab 03/14/21 1418 03/15/21 0500 03/16/21 0131 03/17/21 0105 03/18/21 0156  WBC 6.2 6.2 6.1 3.9* 4.6  NEUTROABS  --  3.9  --   --   --   HGB 15.0 14.9 15.4* 13.9 14.3  HCT 47.1* 46.0 46.7* 42.6 42.9  MCV 91.3 89.8 90.3 89.9 87.6  PLT 171 171 171 149* 0000000   Basic Metabolic Panel: Recent Labs  Lab 03/14/21 1432 03/15/21 0500 03/16/21 0131 03/17/21 0105 03/18/21 0156  NA 130* 131* 133* 125* 127*  K 4.0 4.0 4.0 4.4 4.5  CL 90* 93* 95* 88* 91*  CO2 32 '29 28 28 28  '$ GLUCOSE 209* 108* 123* 138* 163*  BUN 8 6* '14 19 18  '$ CREATININE 0.85 0.77 0.94 0.85 0.78  CALCIUM 9.5 9.2 9.1 9.0 9.0   GFR: Estimated Creatinine Clearance: 39.4 mL/min (by C-G formula based on SCr of 0.78 mg/dL). Liver Function  Tests: Recent Labs  Lab 03/14/21 1432 03/16/21 0131 03/17/21 0105 03/18/21 0156  AST '28 27 22 20  '$ ALT '15 17 12 12  '$ ALKPHOS 58 51 53 51  BILITOT 0.6 0.4 0.6 0.8  PROT 6.8 6.4* 5.8* 6.3*  ALBUMIN 3.0* 2.7* 2.5* 2.6*   No results for input(s): LIPASE, AMYLASE  in the last 168 hours. No results for input(s): AMMONIA in the last 168 hours. Coagulation Profile: No results for input(s): INR, PROTIME in the last 168 hours. Cardiac Enzymes: No results for input(s): CKTOTAL, CKMB, CKMBINDEX, TROPONINI in the last 168 hours. BNP (last 3 results) No results for input(s): PROBNP in the last 8760 hours. HbA1C: No results for input(s): HGBA1C in the last 72 hours.  CBG: Recent Labs  Lab 03/17/21 0740 03/17/21 1210 03/17/21 1725 03/17/21 2131 03/18/21 0808  GLUCAP 92 177* 135* 216* 93   Lipid Profile: No results for input(s): CHOL, HDL, LDLCALC, TRIG, CHOLHDL, LDLDIRECT in the last 72 hours. Thyroid Function Tests: No results for input(s): TSH, T4TOTAL, FREET4, T3FREE, THYROIDAB in the last 72 hours. Anemia Panel: No results for input(s): VITAMINB12, FOLATE, FERRITIN, TIBC, IRON, RETICCTPCT in the last 72 hours. Sepsis Labs: No results for input(s): PROCALCITON, LATICACIDVEN in the last 168 hours.  Recent Results (from the past 240 hour(s))  SARS CORONAVIRUS 2 (TAT 6-24 HRS) Nasopharyngeal Nasopharyngeal Swab     Status: Abnormal   Collection Time: 03/14/21  5:43 PM   Specimen: Nasopharyngeal Swab  Result Value Ref Range Status   SARS Coronavirus 2 POSITIVE (A) NEGATIVE Final    Comment: (NOTE) SARS-CoV-2 target nucleic acids are DETECTED.  The SARS-CoV-2 RNA is generally detectable in upper and lower respiratory specimens during the acute phase of infection. Positive results are indicative of the presence of SARS-CoV-2 RNA. Clinical correlation with patient history and other diagnostic information is  necessary to determine patient infection status. Positive results do not rule out bacterial infection or co-infection with other viruses.  The expected result is Negative.  Fact Sheet for Patients: SugarRoll.be  Fact Sheet for Healthcare Providers: https://www.woods-mathews.com/  This test is  not yet approved or cleared by the [place] FDA and  has been authorized for detection and/or diagnosis of SARS-CoV-2 by FDA under an Emergency Use Authorization (EUA). This EUA will remain  in effect (meaning this test can be used) for the duration of the COVID-19 declaration under Section 564(b)(1) of the Act, 21 U. S.C. section 360bbb-3(b)(1), unless the authorization is terminated or revoked sooner.   Performed at [facility], [place] [address] [place], [place] [address]     Radiology Studies: CT Angio Chest Pulmonary Embolism (PE) W or WO Contrast  Result Date: 03/16/2021 CLINICAL DATA:  Chest pain. Recent COVID infection. Shortness of breath. EXAM: CT ANGIOGRAPHY CHEST WITH CONTRAST TECHNIQUE: Multidetector CT imaging of the chest was performed using the standard protocol during bolus administration of intravenous contrast. Multiplanar CT image reconstructions and MIPs were obtained to evaluate the vascular anatomy. CONTRAST:  175m OMNIPAQUE IOHEXOL 350 MG/ML SOLN COMPARISON:  Chest radiograph 03/14/2021 FINDINGS: Cardiovascular: Coronary, aortic arch, and branch vessel atherosclerotic vascular disease. Mild cardiomegaly. Substantial streak artifact related to arm positioning, and bilateral humeral implants. Relative lucencies in upper lobe pulmonary venous and pulmonary arterial structures on the axial images is not well reflected on multiplanar reconstructed images, and based on combination of motion and streak artifact is thought to be artifactual. No definite pulmonary embolus is identified. Sensitivity for small emboli is reduced due  to motion and extensive streak artifact. Mediastinum/Nodes: Hypodense right thyroid nodule 4.0 by 3.5 cm on image 13 series 5, roughly similar on CT cervical spine 01/13/2018. In the setting of significant comorbidities or limited life expectancy, no follow-up recommended (ref: J Am Coll Radiol. 2015 Feb;12(2): 143-50). Large hiatal hernia containing  stomach. Lungs/Pleura: Bandlike scarring at the lung apices, right greater than left. Mild dependent atelectasis in both lungs. Atelectasis medially in the left lower lobe adjacent to the hiatal hernia. Upper Abdomen: Stable biliary dilatation. Musculoskeletal: Thoracic spondylosis. The right humeral head appears subluxed medially and cephalad with respect to the glenoid. Review of the MIP images confirms the above findings. IMPRESSION: 1. No embolus identified. Reduced sensitivity due to motion artifact and streak artifact from implants and arm positioning. 2. Stable 4 cm right thyroid nodule. In the setting of significant comorbidities or limited life expectancy, no follow-up recommended (ref: J Am Coll Radiol. 2015 Feb;12(2): 143-50). 3. Large hiatal hernia with adjacent atelectasis. Mild dependent atelectasis in both lungs. 4. Bandlike scarring at the lung apices. 5. Stable biliary dilatation in the upper abdomen compared to CT abdomen from 08/23/2018. 6. Bilateral humeral implants with the right humeral implant subluxed with respect to the glenoid. Electronically Signed   By: Van Clines M.D.   On: 03/16/2021 14:35   VAS [place] LOWER EXTREMITY VENOUS (DVT)  Result Date: 03/16/2021  Lower Venous DVT Study Patient Name:  MICHAILA LORTZ  Date of Exam:   03/16/2021 Medical Rec #: [id number]        Accession #:    [id number] Date of Birth: [birth year]        Patient Gender: F Patient Age:   89Y Exam Location:  [facility] Procedure:      VAS [place] LOWER EXTREMITY VENOUS (DVT) Referring Phys: [id number] [place] --------------------------------------------------------------------------------  Indications: Swelling, and Edema.  Comparison Study: no prior Performing Technologist: Archie Patten RVS  Examination Guidelines: A complete evaluation includes B-mode imaging, spectral Doppler, color Doppler, and power Doppler as needed of all accessible portions of each vessel. Bilateral testing is considered an  integral part of a complete examination. Limited examinations for reoccurring indications may be performed as noted. The reflux portion of the exam is performed with the patient in reverse Trendelenburg.  +---------+---------------+---------+-----------+----------+-------------------+ RIGHT    CompressibilityPhasicitySpontaneityPropertiesThrombus Aging      +---------+---------------+---------+-----------+----------+-------------------+ CFV      Full           Yes      Yes                                      +---------+---------------+---------+-----------+----------+-------------------+ SFJ      Full                                                             +---------+---------------+---------+-----------+----------+-------------------+ FV Prox  Full                                                             +---------+---------------+---------+-----------+----------+-------------------+ FV  Mid   Full                                                             +---------+---------------+---------+-----------+----------+-------------------+ FV DistalFull                                                             +---------+---------------+---------+-----------+----------+-------------------+ PFV      Full                                                             +---------+---------------+---------+-----------+----------+-------------------+ POP      Full           Yes      Yes                                      +---------+---------------+---------+-----------+----------+-------------------+ PTV      Full                                                             +---------+---------------+---------+-----------+----------+-------------------+ PERO                                                  Not well visualized +---------+---------------+---------+-----------+----------+-------------------+    +---------+---------------+---------+-----------+----------+-------------------+ LEFT     CompressibilityPhasicitySpontaneityPropertiesThrombus Aging      +---------+---------------+---------+-----------+----------+-------------------+ CFV      Full           Yes      Yes                                      +---------+---------------+---------+-----------+----------+-------------------+ SFJ      Full                                                             +---------+---------------+---------+-----------+----------+-------------------+ FV Prox  Full                                                             +---------+---------------+---------+-----------+----------+-------------------+ FV Mid   Full                                                             +---------+---------------+---------+-----------+----------+-------------------+  FV DistalFull                                                             +---------+---------------+---------+-----------+----------+-------------------+ PFV      Full                                                             +---------+---------------+---------+-----------+----------+-------------------+ POP      Full           Yes      Yes                                      +---------+---------------+---------+-----------+----------+-------------------+ PTV      Full                                                             +---------+---------------+---------+-----------+----------+-------------------+ PERO                                                  Not well visualized +---------+---------------+---------+-----------+----------+-------------------+     Summary: BILATERAL: - No evidence of deep vein thrombosis seen in the lower extremities, bilaterally. -No evidence of popliteal cyst, bilaterally.   *See table(s) above for measurements and observations. Electronically signed by Harold Barban  MD on 03/16/2021 at 4:30:06 PM.    Final      LOS: 3 days   Deatra James, MD [facility]  03/18/2021, 11:24 AM

## 2021-03-18 NOTE — Progress Notes (Signed)
Occupational Therapy Treatment Patient Details Name: Heidi Dominguez MRN: [id number] DOB: [birth year] Today's Date: 03/18/2021    History of present illness 87 yo female with onset of persistent cough that has evolved into chest pain was brought to ED, had become unable to walk.  Has new Covid dx, also new L great toe osteomyelitis.  PMHx:  CVA, memory changes, DM, CKD3, pancreatitis, CHF, HTN, osteomyelitis L great toe, OA, back pain   OT comments  Awakened pt for OT. Pt required max assist for bed mobility. Sat EOB x 10 minutes with min assist while participating in grooming and reaching activities to activate core. Pt agreeable to OOB to chair, but wanted to finish her nap prior to getting up to chair, will defer to nursing staff later today.   Follow Up Recommendations  SNF    Equipment Recommendations  Other (comment) (defer to SNF)    Recommendations for Other Services      Precautions / Restrictions Precautions Precautions: Fall Precaution Comments: watch sats       Mobility Bed Mobility Overal bed mobility: Needs Assistance Bed Mobility: Supine to Sit;Sit to Supine     Supine to sit: Max assist;HOB elevated Sit to supine: Max assist   General bed mobility comments: Assist to bring legs off of bed, elevate trunk into sitting and bring hips to EOB    Transfers                      Balance Overall balance assessment: Needs assistance Sitting-balance support: Feet unsupported Sitting balance-Leahy Scale: Poor Sitting balance - Comments: min assist while participating in grooming at EOB Postural control: Posterior lean                                 ADL either performed or assessed with clinical judgement   ADL Overall ADL's : Needs assistance/impaired     Grooming: Minimal assistance;Sitting;Wash/dry hands;Wash/dry face;Oral care Grooming Details (indicate cue type and reason): min assist for sitting balance                                      Vision       Perception     Praxis      Cognition Arousal/Alertness: Awake/alert Behavior During Therapy: WFL for tasks assessed/performed Overall Cognitive Status: No family/caregiver present to determine baseline cognitive functioning Area of Impairment: Following commands                       Following Commands: Follows one step commands with increased time;Follows one step commands inconsistently Safety/Judgement: Decreased awareness of safety;Decreased awareness of deficits              Exercises     Shoulder Instructions       General Comments      Pertinent Vitals/ Pain       Faces Pain Scale: No hurt  Home Living                                          Prior Functioning/Environment              Frequency  Min 2X/week        Progress Toward  Goals  OT Goals(current goals can now be found in the care plan section)  Progress towards OT goals: Progressing toward goals  Acute Rehab OT Goals Patient Stated Goal: to stand up OT Goal Formulation: With patient Time For Goal Achievement: 03/30/21 Potential to Achieve Goals: Good  Plan Discharge plan remains appropriate    Co-evaluation                 AM-PAC OT "6 Clicks" Daily Activity     Outcome Measure   Help from another person eating meals?: A Little Help from another person taking care of personal grooming?: A Little Help from another person toileting, which includes using toliet, bedpan, or urinal?: Total Help from another person bathing (including washing, rinsing, drying)?: A Lot Help from another person to put on and taking off regular upper body clothing?: A Lot Help from another person to put on and taking off regular lower body clothing?: Total 6 Click Score: 12    End of Session Equipment Utilized During Treatment: Oxygen  OT Visit Diagnosis: Unsteadiness on feet (R26.81);Muscle weakness (generalized) (M62.81)    Activity Tolerance Patient tolerated treatment well   Patient Left in bed;with call bell/phone within reach;with bed alarm set   Nurse Communication          Time: [id number] OT Time Calculation (min): 15 min  Charges: OT General Charges $OT Visit: 1 Visit OT Treatments $Self Care/Home Management : 8-22 mins  Heidi Dominguez, OTR/L Acute Rehabilitation Services Pager: [phone] Office: [phone]    Heidi Dominguez 03/18/2021, 3:23 PM

## 2021-03-18 NOTE — TOC Progression Note (Signed)
Transition of Care [facility]) - Progression Note    Patient Details  Name: KEALANI BEAUDREAU MRN: [id number] Date of Birth: [birth year]  Transition of Care [facility]) CM/SW [place], LCSW Phone Number: 03/18/2021, 2:18 PM  Clinical Narrative:    CSW updated patient's daughter and let her know that [facility] is unable to accept patients on the weekend so patient would not be discharging before Monday depending on medical stability.    Expected Discharge Plan: [place] Barriers to Discharge: Continued Medical Work up, SNF Covid  Expected Discharge Plan and Services Expected Discharge Plan: [place] In-house Referral: Clinical Social Work     Living arrangements for the past 2 months: Single Family Home                                       Social Determinants of Health (SDOH) Interventions    Readmission Risk Interventions No flowsheet data found.

## 2021-03-18 NOTE — Progress Notes (Signed)
Palliative Medicine Inpatient Follow Up Note  Consulting Provider: Antonieta Pert, MD   Reason for consult:   Heidi Dominguez Palliative Medicine Consult  Reason for Consult? GOC    HPI:  Per intake H&P --> 87 year old female with history of IDDM, chronic diastolic congestive heart failure, hypertension, CKD stage III, recent left big toe osteomyelitis on IV antibiotics 3 wks ago but IV line got infiltrated 4 dasy PTA so antibiotics were on hold, PVD status post left tibial artery angioplasty and stenting in 2022 May presents with persistent cough, chest pain. Patient recently diagnosed with COVID last week by home care nurse, starting last Friday having dry cough chest pain on the left side worsened with cough and movement no shortness of breath no GI symptoms.    Palliative care was asked and involved to address goals of care in the setting of multiple chronic comorbidities and advanced age.  Today's Discussion (03/18/2021):  *Please note that this is a verbal dictation therefore any spelling or grammatical errors are due to the "[place] One" system interpretation.  Chart reviewed.   I met with Heidi Dominguez at bedside this morning. She was awake and alert. Heidi Dominguez was in good spirits and requests to know when she will be discharging to rehabilition. I shared with her that I do not know presently. Otherwise we review that her appetite has been fair and that she has worked with the therapy team.  I called patients daughter, Heidi Dominguez this morning. She requests more information on Palliative care int terms of outpatient services. After we reviewed these she shares that she does not feel she is in need of additional services. I reviewed the importance of ongoing Palliative care conversations given Heidi Dominguez's advanced age and co-morbidities. Heidi Dominguez did not feel these were needed at the present time. She shares that her niece if a physician and she uses her for these conversations. I asked  her if [place] continued to decline if her goals would change? Would hospice ever be a consideration? Heidi Dominguez did share that she would be open to this and then stated, " my mother is fine, she just needs to get home as soon as possible." Additional questions and concerns addressed. I shared that I will request the primary medical team reach out to [place] for an update.   Heidi Dominguez states that she will send a copy of the advance directives to be scanned into the EMR when she is able to.   Objective Assessment: Vital Signs Vitals:   03/18/21 0438 03/18/21 0804  BP: 136/74 138/70  Pulse: 68   Resp: 16   Temp: 97.8 F (36.6 C) 98.4 F (36.9 C)  SpO2: 97%     Intake/Output Summary (Last 24 hours) at 03/18/2021 1132 Last data filed at 03/18/2021 8891 Gross per 24 hour  Intake 100 ml  Output 1700 ml  Net -1600 ml   Last Weight  Most recent update: 03/14/2021  6:38 PM    Weight  76.7 kg (169 lb)            Gen:  Elderly F in NAD HEENT: moist mucous membranes CV: Regular rate and rhythm PULM: On 2LPM [place] ABD: soft/nontender EXT: No edema Neuro: Alert and oriented x3  SUMMARY OF RECOMMENDATIONS   Full Code / Full Scope of Care   Requested a Copy of Advance Directives - Daughter Plans to email it today   Appreciate primary medical team providing family with a comprehensive medical update   Plan for SNF  placement  We will sign off as patients family has declined additional palliative services and are hopeful for full recovery.   Time Spent: 30 Greater than 50% of the time was spent in counseling and coordination of care ______________________________________________________________________________________ [place] Team Team Cell Phone: [phone] Please utilize secure chat with additional questions, if there is no response within 30 minutes please call the above phone number  Palliative Medicine Team providers are available by phone from  7am to 7pm daily and can be reached through the team cell phone.  Should this patient require assistance outside of these hours, please call the patient's attending physician.

## 2021-03-19 LAB — GLUCOSE, CAPILLARY
Glucose-Capillary: 87 mg/dL (ref 70–99)
Glucose-Capillary: 99 mg/dL (ref 70–99)
Glucose-Capillary: 227 mg/dL — ABNORMAL HIGH (ref 70–99)
Glucose-Capillary: 167 mg/dL — ABNORMAL HIGH (ref 70–99)

## 2021-03-19 MED ORDER — SODIUM CHLORIDE 1 G PO TABS
1.0000 g | ORAL_TABLET | Freq: Three times a day (TID) | ORAL | Status: DC
  Administered 2021-03-20 – 2021-03-23 (×12): 1 g via ORAL
  Filled 2021-03-19 (×13): qty 1

## 2021-03-19 NOTE — Progress Notes (Signed)
PROGRESS NOTE    Heidi Dominguez  ZOX:[id number] DOB: 13-Apr-1921 DOA: 03/14/2021 PCP: Merlene Laughter, MD   Brief Narrative:  This 87 year old female with history of IDDM, chronic diastolic congestive heart failure, hypertension, CKD stage III, recent left big toe osteomyelitis on IV antibiotics 3 wks ago but IV line got infiltrated 4 days PTA so antibiotics were on hold, PVD,  status post left tibial artery angioplasty and stenting in May 2022 presented with persistent cough, chest pain. Patient recently diagnosed with COVID last week by home care nurse, starting last Friday having dry cough,  chest pain on the left side worsened with cough and movement, denies any shortness of breath or GI symptoms. Patient is admitted for further management of cough and chest pain.  Assessment & Plan:   Active Problems:   Chest pain  Acute hypoxic respiratory failure secondary to COVID+. Patient presented with chest pain and persistent cough. Chest pain has resolved.  Cough is improving with cough medication. Hypoxia has much improved.  She is now on room air,  sats 97%. Chest pain appears noncardiac in the setting of COVID-19 infection. Chest x-ray unremarkable.  Troponin negative x3.  BNP normal. Patient was hypoxic on arrival 86% on room air,  started on 2 L,  now off oxygen. Inflammatory markers slightly elevated. Continue remdesivir and Decadron.  Continue supportive measures vitamin C,  zinc Continue airborne isolation.  D-dimer elevated, CT angiogram completed no PE noted.   Left leg toe ulcer with osteomyelitis: She has completed 3 weeks of IV antibiotics before IV got infiltrated 4 days prior to arrival. Wound care consulted,  discussed with ID Dr. Dickey Gave, advised to continue p.o. doxycycline and metronidazole for now and ID follow-up.  Insulin-dependent diabetes.: Blood sugar slightly elevated due to steroids. Continue sliding scale.  Hemoglobin A1c 6.9  CKD stage IIIa: Renal  functions at baseline.  Avoid nephrotoxic medication.  Continue to monitor renal functions  Hyponatremia:  Continue to monitor serum sodium levels. Sodium slightly improving 127 today.  Essential hypertension: Continue amlodipine, hold losartan,  continue to monitor  PVD with left renal artery angioplasty and stenting in May 2020: Continue aspirin Plavix and statin  Acute on chronic deconditioning: PT and OT evaluation  Leukopenia/thrombocytopenia:   likely from COVID infection   Goals of care: Patient has multiple comorbidities, elderly will benefit from palliative care evaluation while hospitalized. Palliative care has reached out to the daughter 8/3 who wants full scope of treatment    DVT prophylaxis: Lovenox Code Status: Full code Family Communication: No family at bedside Disposition Plan:   Status is: Inpatient  Remains inpatient appropriate because:Inpatient level of care appropriate due to severity of illness  Dispo: The patient is from: Home              Anticipated d/c is to: SNF              Patient currently is not medically stable to d/c.   Difficult to place patient No  Consultants:   [facility]  Procedures:  Antimicrobials:   Anti-infectives (From admission, onward)    Start     Dose/Rate Route Frequency Ordered Stop   03/16/21 1000  remdesivir 100 mg in sodium chloride 0.9 % 100 mL IVPB       See Hyperspace for full Linked Orders Report.   100 mg 200 mL/hr over 30 Minutes Intravenous Daily 03/15/21 1429 03/19/21 1034   03/15/21 1600  remdesivir 200 mg in sodium chloride 0.9% 250 mL IVPB  See Hyperspace for full Linked Orders Report.   200 mg 580 mL/hr over 30 Minutes Intravenous Once 03/15/21 1429 03/15/21 1816   03/15/21 1445  metroNIDAZOLE (FLAGYL) tablet 500 mg        500 mg Oral Every 12 hours 03/15/21 1435     03/15/21 1415  doxycycline (VIBRA-TABS) tablet 100 mg        100 mg Oral Every 12 hours 03/15/21 1406     03/15/21 1415   metroNIDAZOLE (FLAGYL) tablet 500 mg  Status:  Discontinued        500 mg Oral Every 8 hours 03/15/21 1406 03/15/21 1432       Subjective: Patient was seen and examined at bedside.  No overnight events.  She is lying comfortably on the bed,  denies any chest pain or shortness of breath.   She is off of home oxygen.  She reported left foot big toe infection is getting better.  Objective: Vitals:   03/19/21 0100 03/19/21 0605 03/19/21 0751 03/19/21 1200  BP: (!) 114/54 (!) 102/58 129/60 (!) 145/61  Pulse: 68 63 70 85  Resp: 15 14 18 18   Temp: 97.8 F (36.6 C) 98 F (36.7 C) 97.8 F (36.6 C) 98.6 F (37 C)  TempSrc: Oral Oral Oral Oral  SpO2: 94% 94% 93% 97%  Weight:      Height:        Intake/Output Summary (Last 24 hours) at 03/19/2021 1438 Last data filed at 03/19/2021 4403 Gross per 24 hour  Intake --  Output 2180 ml  Net -2180 ml   Filed Weights   03/14/21 1838  Weight: 76.7 kg    Examination:  General exam: Appears calm and comfortable , not in any acute distress. Respiratory system: Clear to auscultation. Respiratory effort normal. Cardiovascular system: S1 & S2 heard, RRR. No JVD, murmurs, rubs, gallops or clicks. No pedal edema. Gastrointestinal system: Abdomen is nondistended, soft and nontender. No organomegaly or masses felt. Normal bowel sounds heard. Central nervous system: Alert and oriented. No focal neurological deficits. Extremities: No edema, no cyanosis, no clubbing. Skin: No rashes, lesions or ulcers, Left big toe erythematous change. Psychiatry: Judgement and insight appear normal. Mood & affect appropriate.     Data Reviewed: I have personally reviewed following labs and imaging studies  CBC: Recent Labs  Lab 03/14/21 1418 03/15/21 0500 03/16/21 0131 03/17/21 0105 03/18/21 0156  WBC 6.2 6.2 6.1 3.9* 4.6  NEUTROABS  --  3.9  --   --   --   HGB 15.0 14.9 15.4* 13.9 14.3  HCT 47.1* 46.0 46.7* 42.6 42.9  MCV 91.3 89.8 90.3 89.9 87.6  PLT  171 171 171 149* 158   Basic Metabolic Panel: Recent Labs  Lab 03/14/21 1432 03/15/21 0500 03/16/21 0131 03/17/21 0105 03/18/21 0156  NA 130* 131* 133* 125* 127*  K 4.0 4.0 4.0 4.4 4.5  CL 90* 93* 95* 88* 91*  CO2 32 29 28 28 28   GLUCOSE 209* 108* 123* 138* 163*  BUN 8 6* 14 19 18   CREATININE 0.85 0.77 0.94 0.85 0.78  CALCIUM 9.5 9.2 9.1 9.0 9.0   GFR: Estimated Creatinine Clearance: 39.4 mL/min (by C-G formula based on SCr of 0.78 mg/dL). Liver Function Tests: Recent Labs  Lab 03/14/21 1432 03/16/21 0131 03/17/21 0105 03/18/21 0156  AST 28 27 22 20   ALT 15 17 12 12   ALKPHOS 58 51 53 51  BILITOT 0.6 0.4 0.6 0.8  PROT 6.8 6.4* 5.8* 6.3*  ALBUMIN  3.0* 2.7* 2.5* 2.6*   No results for input(s): LIPASE, AMYLASE in the last 168 hours. No results for input(s): AMMONIA in the last 168 hours. Coagulation Profile: No results for input(s): INR, PROTIME in the last 168 hours. Cardiac Enzymes: No results for input(s): CKTOTAL, CKMB, CKMBINDEX, TROPONINI in the last 168 hours. BNP (last 3 results) No results for input(s): PROBNP in the last 8760 hours. HbA1C: No results for input(s): HGBA1C in the last 72 hours. CBG: Recent Labs  Lab 03/18/21 1227 03/18/21 1624 03/18/21 2201 03/19/21 0756 03/19/21 1203  GLUCAP 129* 195* 259* 87 99   Lipid Profile: No results for input(s): CHOL, HDL, LDLCALC, TRIG, CHOLHDL, LDLDIRECT in the last 72 hours. Thyroid Function Tests: No results for input(s): TSH, T4TOTAL, FREET4, T3FREE, THYROIDAB in the last 72 hours. Anemia Panel: No results for input(s): VITAMINB12, FOLATE, FERRITIN, TIBC, IRON, RETICCTPCT in the last 72 hours. Sepsis Labs: No results for input(s): PROCALCITON, LATICACIDVEN in the last 168 hours.  Recent Results (from the past 240 hour(s))  SARS CORONAVIRUS 2 (TAT 6-24 HRS) Nasopharyngeal Nasopharyngeal Swab     Status: Abnormal   Collection Time: 03/14/21  5:43 PM   Specimen: Nasopharyngeal Swab  Result Value Ref  Range Status   SARS Coronavirus 2 POSITIVE (A) NEGATIVE Final    Comment: (NOTE) SARS-CoV-2 target nucleic acids are DETECTED.  The SARS-CoV-2 RNA is generally detectable in upper and lower respiratory specimens during the acute phase of infection. Positive results are indicative of the presence of SARS-CoV-2 RNA. Clinical correlation with patient history and other diagnostic information is  necessary to determine patient infection status. Positive results do not rule out bacterial infection or co-infection with other viruses.  The expected result is Negative.  Fact Sheet for Patients: HairSlick.no  Fact Sheet for Healthcare Providers: quierodirigir.com  This test is not yet approved or cleared by the [place] FDA and  has been authorized for detection and/or diagnosis of SARS-CoV-2 by FDA under an Emergency Use Authorization (EUA). This EUA will remain  in effect (meaning this test can be used) for the duration of the COVID-19 declaration under Section 564(b)(1) of the Act, 21 U. S.C. section 360bbb-3(b)(1), unless the authorization is terminated or revoked sooner.   Performed at [facility], [address], [place], [place] [address]          Radiology Studies: No results found.      Scheduled Meds:  amLODipine  10 mg Oral Daily   vitamin C  500 mg Oral Daily   aspirin EC  81 mg Oral Daily   benzonatate  100 mg Oral TID   clopidogrel  75 mg Oral Daily   dexamethasone (DECADRON) injection  4 mg Intravenous Q24H   doxycycline  100 mg Oral Q12H   enoxaparin (LOVENOX) injection  40 mg Subcutaneous Q24H   famotidine  10 mg Oral BID   ferrous sulfate  325 mg Oral QHS   furosemide  20 mg Oral Daily   guaiFENesin  1,200 mg Oral BID   insulin aspart  0-15 Units Subcutaneous TID WC   lactobacillus  1 g Oral TID WC   lidocaine  1 patch Transdermal Q24H   linagliptin  5 mg Oral Daily   loratadine  10  mg Oral Daily   metroNIDAZOLE  500 mg Oral Q12H   pantoprazole  40 mg Oral Daily   [START ON 03/20/2021] vitamin B-12  1,000 mcg Oral Q Sun   zinc sulfate  220 mg  Oral Daily   Continuous Infusions:   LOS: 4 days    Time spent: 35 mins    Antoinne Spadaccini, MD [facility]   If 7PM-7AM, please contact night-coverage

## 2021-03-19 NOTE — Plan of Care (Signed)
  Problem: Education: Goal: Knowledge of General Education information will improve Description Including pain rating scale, medication(s)/side effects and non-pharmacologic comfort measures Outcome: Progressing   

## 2021-03-19 NOTE — Plan of Care (Signed)
  Problem: Health Behavior/Discharge Planning: Goal: Ability to manage health-related needs will improve Outcome: Progressing   

## 2021-03-20 LAB — GLUCOSE, CAPILLARY
Glucose-Capillary: 82 mg/dL (ref 70–99)
Glucose-Capillary: 138 mg/dL — ABNORMAL HIGH (ref 70–99)
Glucose-Capillary: 185 mg/dL — ABNORMAL HIGH (ref 70–99)
Glucose-Capillary: 170 mg/dL — ABNORMAL HIGH (ref 70–99)

## 2021-03-20 LAB — BASIC METABOLIC PANEL
Anion gap: 9 (ref 5–15)
BUN: 15 mg/dL (ref 8–23)
CO2: 27 mmol/L (ref 22–32)
Calcium: 9.1 mg/dL (ref 8.9–10.3)
Chloride: 95 mmol/L — ABNORMAL LOW (ref 98–111)
Creatinine, Ser: 0.86 mg/dL (ref 0.44–1.00)
GFR, Estimated: >60 mL/min (ref >60)
Glucose, Bld: 160 mg/dL — ABNORMAL HIGH (ref 70–99)
Potassium: 4.2 mmol/L (ref 3.5–5.1)
Sodium: 131 mmol/L — ABNORMAL LOW (ref 135–145)

## 2021-03-20 MED ORDER — SALINE SPRAY 0.65 % NA SOLN
1.0000 | NASAL | Status: DC | PRN
  Filled 2021-03-20: qty 44

## 2021-03-20 MED ORDER — LOSARTAN POTASSIUM 50 MG PO TABS
25.0000 mg | ORAL_TABLET | Freq: Every day | ORAL | Status: DC
  Administered 2021-03-20 – 2021-03-23 (×4): 25 mg via ORAL
  Filled 2021-03-20 (×4): qty 1

## 2021-03-20 NOTE — Progress Notes (Signed)
PROGRESS NOTE    Heidi Dominguez  [id number] DOB: 09/24/1920 DOA: 03/14/2021 PCP: Lajean Manes, MD   Brief Narrative:  This 87 year old female with history of IDDM, chronic diastolic congestive heart failure, hypertension, CKD stage III, recent left big toe osteomyelitis on IV antibiotics 3 wks ago but IV line got infiltrated 4 days PTA so antibiotics were on hold, PVD,  status post left tibial artery angioplasty and stenting in May 2022 presented with persistent cough, chest pain. Patient recently diagnosed with COVID last week by home care nurse, starting last Friday having dry cough,  chest pain on the left side worsened with cough and movement, denies any shortness of breath or GI symptoms. Patient is admitted for further management of cough and chest pain.  Assessment & Plan:   Active Problems:   Chest pain  Acute hypoxic respiratory failure secondary to COVID+. Patient presented with chest pain and persistent cough. Chest pain has resolved.  Cough is improving with cough medication. Hypoxia has much improved.  She is now on room air,  sats 97%. Chest pain appears noncardiac in the setting of COVID-19 infection. Chest x-ray unremarkable.  Troponin negative x3.  BNP normal. Patient was hypoxic on arrival 86% on room air,  started on 2 L,  now off oxygen. Inflammatory markers slightly elevated. Continue remdesivir and Decadron.  Continue supportive measures vitamin C,  zinc Continue airborne isolation.  D-dimer elevated, CT angiogram completed no PE noted.   Left leg toe ulcer with osteomyelitis: She has completed 3 weeks of IV antibiotics before IV got infiltrated 4 days prior to arrival. Wound care consulted,  discussed with ID Dr. Charolotte Eke, advised to continue p.o. doxycycline and metronidazole for now and ID follow-up.  Insulin-dependent diabetes.: Blood sugar slightly elevated due to steroids. Continue sliding scale.  Hemoglobin A1c 6.9  CKD stage IIIa: Renal  functions at baseline.  Avoid nephrotoxic medication.  Continue to monitor renal functions  Hyponatremia:  Continue to monitor serum sodium levels. Sodium slightly improving 131 today. Continue salt tablets.  Essential hypertension: Continue amlodipine, losartan.  PVD with left renal artery angioplasty and stenting in May 2020: Continue aspirin, Plavix and statin  Acute on chronic deconditioning: PT and OT evaluation,  Leukopenia/thrombocytopenia:   likely from COVID infection ,  Goals of care: Patient has multiple comorbidities, elderly will benefit from palliative care evaluation while hospitalized. Palliative care has reached out to the daughter 8/3 who wants full scope of treatment    DVT prophylaxis: Lovenox Code Status: Full code Family Communication: No family at bedside Disposition Plan:   Status is: Inpatient  Remains inpatient appropriate because:Inpatient level of care appropriate due to severity of illness  Dispo: The patient is from: Home              Anticipated d/c is to: SNF              Patient currently is not medically stable to d/c.   Difficult to place patient No  Consultants:   [facility]  Procedures:  Antimicrobials:   Anti-infectives (From admission, onward)    Start     Dose/Rate Route Frequency Ordered Stop   03/16/21 1000  remdesivir 100 mg in sodium chloride 0.9 % 100 mL IVPB       See Hyperspace for full Linked Orders Report.   100 mg 200 mL/hr over 30 Minutes Intravenous Daily 03/15/21 1429 03/19/21 1034   03/15/21 1600  remdesivir 200 mg in sodium chloride 0.9% 250 mL IVPB  See Hyperspace for full Linked Orders Report.   200 mg 580 mL/hr over 30 Minutes Intravenous Once 03/15/21 1429 03/15/21 1816   03/15/21 1445  metroNIDAZOLE (FLAGYL) tablet 500 mg        500 mg Oral Every 12 hours 03/15/21 1435     03/15/21 1415  doxycycline (VIBRA-TABS) tablet 100 mg        100 mg Oral Every 12 hours 03/15/21 1406     03/15/21 1415   metroNIDAZOLE (FLAGYL) tablet 500 mg  Status:  Discontinued        500 mg Oral Every 8 hours 03/15/21 1406 03/15/21 1432       Subjective: Patient was seen and examined at bedside.  No overnight events noted.  Patient is lying comfortably on the bed denies any chest pain or shortness of breath.  She is off of home oxygen.    Objective: Vitals:   03/20/21 0032 03/20/21 0447 03/20/21 0800 03/20/21 1314  BP: 104/62 119/67  (!) 150/83  Pulse: 75 74  85  Resp: '16 14  18  '$ Temp: 98 F (36.7 C) 97.9 F (36.6 C) 98 F (36.7 C) 98.1 F (36.7 C)  TempSrc: Oral Oral Oral Oral  SpO2: 99% 97%  94%  Weight:      Height:        Intake/Output Summary (Last 24 hours) at 03/20/2021 1323 Last data filed at 03/20/2021 0448 Gross per 24 hour  Intake --  Output 1100 ml  Net -1100 ml    Filed Weights   03/14/21 1838  Weight: 76.7 kg    Examination:  General exam: Appears comfortable, no signs of any acute pain Respiratory system: Clear to auscultation. Respiratory effort normal.  Respiratory rate 16 Cardiovascular system: S1 & S2 heard, RRR. No JVD, murmurs, rubs, gallops or clicks. No pedal edema. Gastrointestinal system: Abdomen is nondistended, soft and nontender. No organomegaly or masses felt. Normal bowel sounds heard. Central nervous system: Alert and oriented. No focal neurological deficits. Extremities: No edema, no cyanosis, no clubbing. Skin: No rashes, lesions or ulcers, Left big toe erythematous change, but in dressing Psychiatry: Judgement and insight appear normal. Mood & affect appropriate.     Data Reviewed: I have personally reviewed following labs and imaging studies  CBC: Recent Labs  Lab 03/14/21 1418 03/15/21 0500 03/16/21 0131 03/17/21 0105 03/18/21 0156  WBC 6.2 6.2 6.1 3.9* 4.6  NEUTROABS  --  3.9  --   --   --   HGB 15.0 14.9 15.4* 13.9 14.3  HCT 47.1* 46.0 46.7* 42.6 42.9  MCV 91.3 89.8 90.3 89.9 87.6  PLT 171 171 171 149* 0000000    Basic Metabolic  Panel: Recent Labs  Lab 03/15/21 0500 03/16/21 0131 03/17/21 0105 03/18/21 0156 03/20/21 0024  NA 131* 133* 125* 127* 131*  K 4.0 4.0 4.4 4.5 4.2  CL 93* 95* 88* 91* 95*  CO2 '29 28 28 28 27  '$ GLUCOSE 108* 123* 138* 163* 160*  BUN 6* '14 19 18 15  '$ CREATININE 0.77 0.94 0.85 0.78 0.86  CALCIUM 9.2 9.1 9.0 9.0 9.1    GFR: Estimated Creatinine Clearance: 36.6 mL/min (by C-G formula based on SCr of 0.86 mg/dL). Liver Function Tests: Recent Labs  Lab 03/14/21 1432 03/16/21 0131 03/17/21 0105 03/18/21 0156  AST '28 27 22 20  '$ ALT '15 17 12 12  '$ ALKPHOS 58 51 53 51  BILITOT 0.6 0.4 0.6 0.8  PROT 6.8 6.4* 5.8* 6.3*  ALBUMIN 3.0* 2.7* 2.5* 2.6*  No results for input(s): LIPASE, AMYLASE in the last 168 hours. No results for input(s): AMMONIA in the last 168 hours. Coagulation Profile: No results for input(s): INR, PROTIME in the last 168 hours. Cardiac Enzymes: No results for input(s): CKTOTAL, CKMB, CKMBINDEX, TROPONINI in the last 168 hours. BNP (last 3 results) No results for input(s): PROBNP in the last 8760 hours. HbA1C: No results for input(s): HGBA1C in the last 72 hours. CBG: Recent Labs  Lab 03/19/21 1203 03/19/21 1647 03/19/21 2026 03/20/21 0922 03/20/21 1307  GLUCAP 99 227* 167* 82 138*    Lipid Profile: No results for input(s): CHOL, HDL, LDLCALC, TRIG, CHOLHDL, LDLDIRECT in the last 72 hours. Thyroid Function Tests: No results for input(s): TSH, T4TOTAL, FREET4, T3FREE, THYROIDAB in the last 72 hours. Anemia Panel: No results for input(s): VITAMINB12, FOLATE, FERRITIN, TIBC, IRON, RETICCTPCT in the last 72 hours. Sepsis Labs: No results for input(s): PROCALCITON, LATICACIDVEN in the last 168 hours.  Recent Results (from the past 240 hour(s))  SARS CORONAVIRUS 2 (TAT 6-24 HRS) Nasopharyngeal Nasopharyngeal Swab     Status: Abnormal   Collection Time: 03/14/21  5:43 PM   Specimen: Nasopharyngeal Swab  Result Value Ref Range Status   SARS Coronavirus 2  POSITIVE (A) NEGATIVE Final    Comment: (NOTE) SARS-CoV-2 target nucleic acids are DETECTED.  The SARS-CoV-2 RNA is generally detectable in upper and lower respiratory specimens during the acute phase of infection. Positive results are indicative of the presence of SARS-CoV-2 RNA. Clinical correlation with patient history and other diagnostic information is  necessary to determine patient infection status. Positive results do not rule out bacterial infection or co-infection with other viruses.  The expected result is Negative.  Fact Sheet for Patients: SugarRoll.be  Fact Sheet for Healthcare Providers: https://www.woods-mathews.com/  This test is not yet approved or cleared by the [place] FDA and  has been authorized for detection and/or diagnosis of SARS-CoV-2 by FDA under an Emergency Use Authorization (EUA). This EUA will remain  in effect (meaning this test can be used) for the duration of the COVID-19 declaration under Section 564(b)(1) of the Act, 21 U. S.C. section 360bbb-3(b)(1), unless the authorization is terminated or revoked sooner.   Performed at [facility], [place] [address] [place], [place] [address]     Radiology Studies: No results found.  Scheduled Meds:  amLODipine  10 mg Oral Daily   vitamin C  500 mg Oral Daily   aspirin EC  81 mg Oral Daily   benzonatate  100 mg Oral TID   clopidogrel  75 mg Oral Daily   dexamethasone (DECADRON) injection  4 mg Intravenous Q24H   doxycycline  100 mg Oral Q12H   enoxaparin (LOVENOX) injection  40 mg Subcutaneous Q24H   famotidine  10 mg Oral BID   ferrous sulfate  325 mg Oral QHS   furosemide  20 mg Oral Daily   guaiFENesin  1,200 mg Oral BID   insulin aspart  0-15 Units Subcutaneous TID WC   lactobacillus  1 g Oral TID WC   lidocaine  1 patch Transdermal Q24H   linagliptin  5 mg Oral Daily   loratadine  10 mg Oral Daily   metroNIDAZOLE  500 mg Oral Q12H    pantoprazole  40 mg Oral Daily   sodium chloride  1 g Oral TID WC   vitamin B-12  1,000 mcg Oral Q Sun   zinc sulfate  220 mg Oral Daily   Continuous Infusions:  LOS: 5 days    Time spent: 25 mins    Shawna Clamp, MD [facility]   If 7PM-7AM, please contact night-coverage

## 2021-03-21 ENCOUNTER — Encounter (HOSPITAL_COMMUNITY): Payer: Self-pay | Admitting: Internal Medicine

## 2021-03-21 LAB — BASIC METABOLIC PANEL
Anion gap: 9 (ref 5–15)
BUN: 13 mg/dL (ref 8–23)
CO2: 27 mmol/L (ref 22–32)
Calcium: 9.1 mg/dL (ref 8.9–10.3)
Chloride: 96 mmol/L — ABNORMAL LOW (ref 98–111)
Creatinine, Ser: 0.71 mg/dL (ref 0.44–1.00)
GFR, Estimated: >60 mL/min (ref >60)
Glucose, Bld: 125 mg/dL — ABNORMAL HIGH (ref 70–99)
Potassium: 3.9 mmol/L (ref 3.5–5.1)
Sodium: 132 mmol/L — ABNORMAL LOW (ref 135–145)

## 2021-03-21 LAB — GLUCOSE, CAPILLARY
Glucose-Capillary: 69 mg/dL — ABNORMAL LOW (ref 70–99)
Glucose-Capillary: 153 mg/dL — ABNORMAL HIGH (ref 70–99)
Glucose-Capillary: 197 mg/dL — ABNORMAL HIGH (ref 70–99)
Glucose-Capillary: 189 mg/dL — ABNORMAL HIGH (ref 70–99)
Glucose-Capillary: 227 mg/dL — ABNORMAL HIGH (ref 70–99)

## 2021-03-21 NOTE — Progress Notes (Signed)
Physical Therapy Treatment Patient Details Name: Heidi Dominguez MRN: [id number] DOB: April 16, 1921 Today's Date: 03/21/2021    History of Present Illness 87 yo female with onset of persistent cough that has evolved into chest pain was brought to ED, had become unable to walk.  Has new Covid dx, also new L great toe osteomyelitis.  PMHx:  CVA, memory changes, DM, CKD3, pancreatitis, CHF, HTN, osteomyelitis L great toe, OA, back pain    PT Comments    Patient received in bed, pleasant and motivated to participate with therapy. Able to maintain SPO2 >90% on room air, pleth had poor signal but did need 1LPM to maintain sats during activity today. Still requires +2 assist for all transfers- tends to try to sit prematurely and fatigues before being able to pivot all the way around to target seat. Very weak and has limited awareness/ability to correct balance when challenged dynamically. Left up in recliner with all needs met, chair alarm active and nursing staff aware of patient status. Continue to recommend SNF.    Follow Up Recommendations  SNF;Supervision/Assistance - 24 hour     Equipment Recommendations  Wheelchair cushion (measurements PT);Wheelchair (measurements PT);Rolling walker with 5" wheels;3in1 (PT)    Recommendations for Other Services       Precautions / Restrictions Precautions Precautions: Fall Precaution Comments: watch sats Restrictions Weight Bearing Restrictions: No    Mobility  Bed Mobility Overal bed mobility: Needs Assistance Bed Mobility: Supine to Sit;Sit to Supine     Supine to sit: Min assist;HOB elevated     General bed mobility comments: Pt able to swing LEs off EOB and engage abdominal muscles to raise trunk from bed. Required Min As to scoot RT hip forward in order to come to full upright sitting.    Transfers Overall transfer level: Needs assistance Equipment used: Rolling walker (2 wheeled) Transfers: Sit to/from [facility] Sit  to Stand: +2 physical assistance;Mod assist;From elevated surface Stand pivot transfers: +2 physical assistance;Mod assist;Max assist       General transfer comment: Max As of 2 to pivot to [facility].  Pt then pivoted from [facility] to recliner with Moderate assist of 2 and near constant verbal cues for each step and backing fully up to recliner for safety prior to sitting.  Ambulation/Gait             General Gait Details: unable   Stairs             Wheelchair Mobility    Modified Rankin (Stroke Patients Only)       Balance Overall balance assessment: Needs assistance Sitting-balance support: Feet unsupported Sitting balance-Leahy Scale: Poor Sitting balance - Comments: Pt progressing to fair static sitting balance. Still poor dynamic balance. Postural control: Posterior lean Standing balance support: Bilateral upper extremity supported;During functional activity Standing balance-Leahy Scale: Poor Standing balance comment: Stood x 3 with need of BUE support on RW                            Cognition Arousal/Alertness: Awake/alert Behavior During Therapy: WFL for tasks assessed/performed Overall Cognitive Status: No family/caregiver present to determine baseline cognitive functioning                         Following Commands: Follows one step commands consistently;Follows one step commands with increased time Safety/Judgement: Decreased awareness of safety;Decreased awareness of deficits     General Comments: limited awareness  of deficts, also has limited ability to correct and tends to try to keep moving even when she does have her balance      Exercises      General Comments General comments (skin integrity, edema, etc.): poor pleth but able to maintain sats in the 90s at rest; did need 1LPM to maintain sats at acceptable level during mobility      Pertinent Vitals/Pain Pain Score: 0-No pain Faces Pain Scale: No hurt    Home Living                       Prior Function            PT Goals (current goals can now be found in the care plan section) Acute Rehab PT Goals Patient Stated Goal: "To go to a nice place where to people are nice for rehab" PT Goal Formulation: With patient/family Time For Goal Achievement: 03/29/21 Potential to Achieve Goals: Good Progress towards PT goals: Progressing toward goals    Frequency    Min 3X/week      PT Plan Current plan remains appropriate;Frequency needs to be updated    Co-evaluation   Reason for Co-Treatment: For patient/therapist safety;Complexity of the patient's impairments (multi-system involvement)   OT goals addressed during session: ADL's and self-care      AM-PAC PT "6 Clicks" Mobility   Outcome Measure  Help needed turning from your back to your side while in a flat bed without using bedrails?: A Little Help needed moving from lying on your back to sitting on the side of a flat bed without using bedrails?: Total Help needed moving to and from a bed to a chair (including a wheelchair)?: Total Help needed standing up from a chair using your arms (e.g., wheelchair or bedside chair)?: Total Help needed to walk in hospital room?: Total Help needed climbing 3-5 steps with a railing? : Total 6 Click Score: 8    End of Session Equipment Utilized During Treatment: Gait belt Activity Tolerance: Patient tolerated treatment well Patient left: in chair;with call bell/phone within reach;with chair alarm set Nurse Communication: Mobility status;Need for lift equipment PT Visit Diagnosis: Unsteadiness on feet (R26.81);Muscle weakness (generalized) (M62.81);Difficulty in walking, not elsewhere classified (R26.2)     Time: 3845-3646 PT Time Calculation (min) (ACUTE ONLY): 29 min  Charges:  $Therapeutic Activity: 8-22 mins (co-tx with OT)                    Ann Lions PT, DPT, PN2   Supplemental Physical Therapist [place]    Pager  [phone] Acute Rehab Office [phone]

## 2021-03-21 NOTE — Progress Notes (Signed)
PROGRESS NOTE    Heidi Dominguez  [id number] DOB: [birth year] DOA: 03/14/2021 PCP: Lajean Manes, MD   Brief Narrative:  This 87 year old female with history of IDDM, chronic diastolic congestive heart failure, hypertension, CKD stage III, recent left big toe osteomyelitis on IV antibiotics 3 wks ago but IV line got infiltrated 4 days PTA so antibiotics were on hold, PVD,  status post left tibial artery angioplasty and stenting in May 2022 presented with persistent cough, chest pain. Patient recently diagnosed with COVID last week by home care nurse, starting last Friday having dry cough,  chest pain on the left side worsened with cough and movement, denies any shortness of breath or GI symptoms. Patient is admitted for further management of cough and chest pain. COVID+  Assessment & Plan:   Active Problems:   Chest pain  Acute hypoxic respiratory failure secondary to COVID+. Patient presented with chest pain and persistent cough. Chest pain has resolved.  Cough is improving with cough medication. Hypoxia has much improved.  She is now on room air,  sats 97%. Chest pain appears noncardiac in the setting of COVID-19 infection. Chest x-ray unremarkable.  Troponin negative x3.  BNP normal. Patient was hypoxic on arrival 86% on room air,  started on 2 L,  now off oxygen. Inflammatory markers slightly elevated. Continue remdesivir and Decadron.  Continue supportive measures vitamin C,  zinc Continue airborne isolation.  D-dimer elevated, CT angiogram completed no PE noted.   Left leg toe ulcer with osteomyelitis: She has completed 3 weeks of IV antibiotics before IV got infiltrated 4 days prior to arrival. Wound care consulted,  discussed with ID Dr. Charolotte Eke,  Advised to continue p.o. doxycycline and metronidazole for now and outpatient ID follow-up.  Insulin-dependent diabetes.: Blood sugar slightly elevated due to steroids. Continue sliding scale.  Hemoglobin A1c 6.9  CKD stage  IIIa: Renal functions at baseline.  Avoid nephrotoxic medication.  Continue to monitor renal functions  Hyponatremia:  Continue to monitor serum sodium levels. Sodium slightly improving 132 today. Continue salt tablets.  Essential hypertension: Continue amlodipine, losartan.  PVD with left renal artery angioplasty and stenting in May 2020: Continue aspirin, Plavix and statin  Acute on chronic deconditioning: PT and OT evaluation,  Leukopenia/thrombocytopenia:   likely from COVID infection ,  Goals of care: Patient has multiple comorbidities, elderly will benefit from palliative care evaluation while hospitalized. Palliative care has reached out to the daughter 03/16/21 who wants full scope of treatment.    DVT prophylaxis: Lovenox Code Status: Full code Family Communication: No family at bedside Disposition Plan:   Status is: Inpatient  Remains inpatient appropriate because:Inpatient level of care appropriate due to severity of illness  Dispo: The patient is from: Home              Anticipated d/c is to: SNF              Patient currently is not medically stable to d/c.   Difficult to place patient No  Consultants:   [facility]  Procedures:  Antimicrobials:   Anti-infectives (From admission, onward)    Start     Dose/Rate Route Frequency Ordered Stop   03/16/21 1000  remdesivir 100 mg in sodium chloride 0.9 % 100 mL IVPB       See Hyperspace for full Linked Orders Report.   100 mg 200 mL/hr over 30 Minutes Intravenous Daily 03/15/21 1429 03/20/21 1335   03/15/21 1600  remdesivir 200 mg in sodium chloride 0.9% 250 mL  IVPB       See Hyperspace for full Linked Orders Report.   200 mg 580 mL/hr over 30 Minutes Intravenous Once 03/15/21 1429 03/15/21 1816   03/15/21 1445  metroNIDAZOLE (FLAGYL) tablet 500 mg        500 mg Oral Every 12 hours 03/15/21 1435     03/15/21 1415  doxycycline (VIBRA-TABS) tablet 100 mg        100 mg Oral Every 12 hours 03/15/21 1406      03/15/21 1415  metroNIDAZOLE (FLAGYL) tablet 500 mg  Status:  Discontinued        500 mg Oral Every 8 hours 03/15/21 1406 03/15/21 1432       Subjective: Patient was seen and examined at bedside.  Overnight events noted.   Patient is lying comfortably on the bed, denies any chest pain or shortness of breath.  She is off of home oxygen.   She reports her blood sugar has dropped last night,  still has some mild wheezing on exam.   Objective: Vitals:   03/20/21 2324 03/21/21 0415 03/21/21 0737 03/21/21 1201  BP: 123/70 (!) 154/82 138/66 111/78  Pulse: 79 78 75 90  Resp: '18 18 14 19  '$ Temp: 98.5 F (36.9 C) 98.5 F (36.9 C) 98 F (36.7 C) 98 F (36.7 C)  TempSrc: Oral Oral Oral Oral  SpO2: 96% 97% 100% 97%  Weight:      Height:        Intake/Output Summary (Last 24 hours) at 03/21/2021 1319 Last data filed at 03/21/2021 0828 Gross per 24 hour  Intake 560 ml  Output 450 ml  Net 110 ml   Filed Weights   03/14/21 1838  Weight: 76.7 kg    Examination:  General exam: Appears comfortable, not in any acute distress. Respiratory system: Mild wheezing noted on auscultation. Respiratory effort normal.  Respiratory rate 16 Cardiovascular system: S1 & S2 heard, RRR. No JVD, murmurs, rubs, gallops or clicks. No pedal edema. Gastrointestinal system: Abdomen is nondistended, soft and nontender. No organomegaly or masses felt. Normal bowel sounds heard. Central nervous system: Alert and oriented X 2 . No focal neurological deficits. Extremities: No edema, no cyanosis, no clubbing. Skin: No rashes, lesions or ulcers, Left big toe erythematous change, but in dressing Psychiatry: Judgement and insight appear normal. Mood & affect appropriate.     Data Reviewed: I have personally reviewed following labs and imaging studies  CBC: Recent Labs  Lab 03/14/21 1418 03/15/21 0500 03/16/21 0131 03/17/21 0105 03/18/21 0156  WBC 6.2 6.2 6.1 3.9* 4.6  NEUTROABS  --  3.9  --   --   --   HGB  15.0 14.9 15.4* 13.9 14.3  HCT 47.1* 46.0 46.7* 42.6 42.9  MCV 91.3 89.8 90.3 89.9 87.6  PLT 171 171 171 149* 0000000   Basic Metabolic Panel: Recent Labs  Lab 03/16/21 0131 03/17/21 0105 03/18/21 0156 03/20/21 0024 03/21/21 0239  NA 133* 125* 127* 131* 132*  K 4.0 4.4 4.5 4.2 3.9  CL 95* 88* 91* 95* 96*  CO2 '28 28 28 27 27  '$ GLUCOSE 123* 138* 163* 160* 125*  BUN '14 19 18 15 13  '$ CREATININE 0.94 0.85 0.78 0.86 0.71  CALCIUM 9.1 9.0 9.0 9.1 9.1   GFR: Estimated Creatinine Clearance: 39.4 mL/min (by C-G formula based on SCr of 0.71 mg/dL). Liver Function Tests: Recent Labs  Lab 03/14/21 1432 03/16/21 0131 03/17/21 0105 03/18/21 0156  AST '28 27 22 20  '$ ALT 15  $'17 12 12  'a$ ALKPHOS 58 51 53 51  BILITOT 0.6 0.4 0.6 0.8  PROT 6.8 6.4* 5.8* 6.3*  ALBUMIN 3.0* 2.7* 2.5* 2.6*   No results for input(s): LIPASE, AMYLASE in the last 168 hours. No results for input(s): AMMONIA in the last 168 hours. Coagulation Profile: No results for input(s): INR, PROTIME in the last 168 hours. Cardiac Enzymes: No results for input(s): CKTOTAL, CKMB, CKMBINDEX, TROPONINI in the last 168 hours. BNP (last 3 results) No results for input(s): PROBNP in the last 8760 hours. HbA1C: No results for input(s): HGBA1C in the last 72 hours. CBG: Recent Labs  Lab 03/20/21 1702 03/20/21 2029 03/21/21 0740 03/21/21 0831 03/21/21 1256  GLUCAP 185* 170* 69* 153* 197*   Lipid Profile: No results for input(s): CHOL, HDL, LDLCALC, TRIG, CHOLHDL, LDLDIRECT in the last 72 hours. Thyroid Function Tests: No results for input(s): TSH, T4TOTAL, FREET4, T3FREE, THYROIDAB in the last 72 hours. Anemia Panel: No results for input(s): VITAMINB12, FOLATE, FERRITIN, TIBC, IRON, RETICCTPCT in the last 72 hours. Sepsis Labs: No results for input(s): PROCALCITON, LATICACIDVEN in the last 168 hours.  Recent Results (from the past 240 hour(s))  SARS CORONAVIRUS 2 (TAT 6-24 HRS) Nasopharyngeal Nasopharyngeal Swab     Status:  Abnormal   Collection Time: 03/14/21  5:43 PM   Specimen: Nasopharyngeal Swab  Result Value Ref Range Status   SARS Coronavirus 2 POSITIVE (A) NEGATIVE Final    Comment: (NOTE) SARS-CoV-2 target nucleic acids are DETECTED.  The SARS-CoV-2 RNA is generally detectable in upper and lower respiratory specimens during the acute phase of infection. Positive results are indicative of the presence of SARS-CoV-2 RNA. Clinical correlation with patient history and other diagnostic information is  necessary to determine patient infection status. Positive results do not rule out bacterial infection or co-infection with other viruses.  The expected result is Negative.  Fact Sheet for Patients: SugarRoll.be  Fact Sheet for Healthcare Providers: https://www.woods-mathews.com/  This test is not yet approved or cleared by the [place] FDA and  has been authorized for detection and/or diagnosis of SARS-CoV-2 by FDA under an Emergency Use Authorization (EUA). This EUA will remain  in effect (meaning this test can be used) for the duration of the COVID-19 declaration under Section 564(b)(1) of the Act, 21 U. S.C. section 360bbb-3(b)(1), unless the authorization is terminated or revoked sooner.   Performed at [facility], [place] [address] [place], [place] [address]     Radiology Studies: No results found.  Scheduled Meds:  amLODipine  10 mg Oral Daily   vitamin C  500 mg Oral Daily   aspirin EC  81 mg Oral Daily   benzonatate  100 mg Oral TID   clopidogrel  75 mg Oral Daily   dexamethasone (DECADRON) injection  4 mg Intravenous Q24H   doxycycline  100 mg Oral Q12H   enoxaparin (LOVENOX) injection  40 mg Subcutaneous Q24H   famotidine  10 mg Oral BID   ferrous sulfate  325 mg Oral QHS   furosemide  20 mg Oral Daily   guaiFENesin  1,200 mg Oral BID   insulin aspart  0-15 Units Subcutaneous TID WC   lactobacillus  1 g Oral TID WC    lidocaine  1 patch Transdermal Q24H   linagliptin  5 mg Oral Daily   loratadine  10 mg Oral Daily   losartan  25 mg Oral Daily   metroNIDAZOLE  500 mg Oral Q12H   pantoprazole  40 mg  Oral Daily   sodium chloride  1 g Oral TID WC   vitamin B-12  1,000 mcg Oral Q Sun   zinc sulfate  220 mg Oral Daily   Continuous Infusions:   LOS: 6 days    Time spent: 25 mins    Shawna Clamp, MD [facility]   If 7PM-7AM, please contact night-coverage

## 2021-03-21 NOTE — Progress Notes (Signed)
Occupational Therapy Treatment Patient Details Name: Heidi Dominguez MRN: [id number] DOB: Sep 27, 1920 Today's Date: 03/21/2021    History of present illness 87 yo female with onset of persistent cough that has evolved into chest pain was brought to ED, had become unable to walk.  Has new Covid dx, also new L great toe osteomyelitis.  PMHx:  CVA, memory changes, DM, CKD3, pancreatitis, CHF, HTN, osteomyelitis L great toe, OA, back pain   OT comments  Patient progressing and showed improved bed mobility in preparation for ADLs with just Min As to scoot anteriorly to EOB, compared to previous session. Patient able to tolerate Stand from EOB and 2 stands from [facility] with Mod As of 2 people. Patient remains limited by decreased safety awareness with need of Max As x 2 for initial pivot to [facility] due to pt attempting to sit too early, along with deficits noted below. Pt with some desaturation but questionable waveform and pt asymptomatic.  Lowest in low 70s on RA, with quick recovery to 90s on 1L.  No supplemental O2 required when pt at rest. Pt continues to demonstrate good rehab potential and would benefit from continued skilled OT to increase safety and independence with ADLs and functional transfers to allow pt to return home safely and reduce caregiver burden and fall risk.   Follow Up Recommendations  SNF    Equipment Recommendations   (defer to post-acute recommendations')    Recommendations for Other Services      Precautions / Restrictions Precautions Precautions: Fall Precaution Comments: watch sats Restrictions Weight Bearing Restrictions: No       Mobility Bed Mobility Overal bed mobility: Needs Assistance Bed Mobility: Supine to Sit;Sit to Supine     Supine to sit: Min assist;HOB elevated     General bed mobility comments: Pt able to swing LEs off EOB and engage abdominal muscles to raise trunk from bed. Required Min As to scoot RT hip forward in order to come to full upright  sitting.    Transfers   Equipment used: Conservation officer, nature (2 wheeled) Transfers: Sit to/from [facility] Sit to Stand: +2 physical assistance;Mod assist;From elevated surface Stand pivot transfers: +2 physical assistance;Mod assist;Max assist       General transfer comment: Max As of 2 to pivot to [facility].  Pt then pivoted from [facility] to recliner with Moderate assist of 2 and near constant verbal cues for each step and backing fully up to recliner for safety prior to sitting.    Balance Overall balance assessment: Needs assistance Sitting-balance support: Feet unsupported Sitting balance-Leahy Scale: Poor Sitting balance - Comments: Pt progressing to fair static sitting balance. Still poor dynamic balance. Postural control: Posterior lean   Standing balance-Leahy Scale: Poor Standing balance comment: Stood x 3 with need of BUE support on RW                           ADL either performed or assessed with clinical judgement   ADL Overall ADL's : Needs assistance/impaired                         Toilet Transfer: RW;BSC;Stand-pivot;+2 for physical assistance;Maximal assistance Toilet Transfer Details (indicate cue type and reason): Pt stood from EOB with Mod As of 2 people, cues for hand placement. Feet blocked due to slipping anteriorly. Increased time/cues and assist to promote anterior weight shift over RW and allow pt to stand u pright  with BIL LE support. PT then initiated pivot to [facility] with need of Max As of 2 people due to pt attempting to sit too early with poor eccentric control. Pt stood from [facility] x 2 reps for hygiene with Mod As of 2 people and cues for hand placement each rep. Toileting- Clothing Manipulation and Hygiene: +2 for physical assistance;Total assistance;Sit to/from stand Toileting - Clothing Manipulation Details (indicate cue type and reason): Pt assisted with stand with pt requiring BUE RW support for balance. OT provided Total assist for  peri care with each stand from [facility].     Functional mobility during ADLs: Moderate assistance;Maximal assistance;+2 for physical assistance;Cueing for sequencing;Cueing for safety;Rolling walker       Vision Baseline Vision/History: Wears glasses Wears Glasses: At all times Patient Visual Report: No change from baseline     Perception     Praxis      Cognition                                                Exercises     Shoulder Instructions       General Comments      Pertinent Vitals/ Pain          Home Living                                          Prior Functioning/Environment              Frequency  Min 2X/week        Progress Toward Goals  OT Goals(current goals can now be found in the care plan section)  Progress towards OT goals: Progressing toward goals  Acute Rehab OT Goals Patient Stated Goal: "To go to a nice place where to people are nice for rehab" OT Goal Formulation: With patient Time For Goal Achievement: 03/30/21 Potential to Achieve Goals: Good  Plan Discharge plan remains appropriate    Co-evaluation    PT/OT/SLP Co-Evaluation/Treatment: Yes Reason for Co-Treatment: For patient/therapist safety;Complexity of the patient's impairments (multi-system involvement)   OT goals addressed during session: ADL's and self-care      AM-PAC OT "6 Clicks" Daily Activity     Outcome Measure   Help from another person eating meals?: A Little Help from another person taking care of personal grooming?: A Little Help from another person toileting, which includes using toliet, bedpan, or urinal?: Total Help from another person bathing (including washing, rinsing, drying)?: A Lot Help from another person to put on and taking off regular upper body clothing?: A Little Help from another person to put on and taking off regular lower body clothing?: Total 6 Click Score: 13    End of Session Equipment  Utilized During Treatment: Oxygen;Gait belt;Rolling walker  OT Visit Diagnosis: Unsteadiness on feet (R26.81);Muscle weakness (generalized) (M62.81)   Activity Tolerance Patient tolerated treatment well   Patient Left in bed;with call bell/phone within reach;with bed alarm set   Nurse Communication          Time: [id number] OT Time Calculation (min): 29 min  Charges: OT General Charges $OT Visit: 1 Visit OT Treatments $Self Care/Home Management : 8-22 mins  Anderson [place], [place] Office: [phone] 03/21/2021   Julien Girt 03/21/2021, 1:34  PM

## 2021-03-22 LAB — GLUCOSE, CAPILLARY
Glucose-Capillary: 97 mg/dL (ref 70–99)
Glucose-Capillary: 167 mg/dL — ABNORMAL HIGH (ref 70–99)
Glucose-Capillary: 168 mg/dL — ABNORMAL HIGH (ref 70–99)
Glucose-Capillary: 174 mg/dL — ABNORMAL HIGH (ref 70–99)

## 2021-03-22 LAB — BASIC METABOLIC PANEL
Anion gap: 6 (ref 5–15)
BUN: 12 mg/dL (ref 8–23)
CO2: 29 mmol/L (ref 22–32)
Calcium: 9 mg/dL (ref 8.9–10.3)
Chloride: 96 mmol/L — ABNORMAL LOW (ref 98–111)
Creatinine, Ser: 0.69 mg/dL (ref 0.44–1.00)
GFR, Estimated: >60 mL/min (ref >60)
Glucose, Bld: 120 mg/dL — ABNORMAL HIGH (ref 70–99)
Potassium: 3.6 mmol/L (ref 3.5–5.1)
Sodium: 131 mmol/L — ABNORMAL LOW (ref 135–145)

## 2021-03-22 NOTE — Progress Notes (Signed)
PROGRESS NOTE    Heidi Dominguez  [id number] DOB: 07-08-1921 DOA: 03/14/2021 PCP: Lajean Manes, MD   Brief Narrative:  This 87 year old female with history of IDDM, chronic diastolic congestive heart failure, hypertension, CKD stage III, recent left big toe osteomyelitis on IV antibiotics 3 wks ago but IV line got infiltrated 4 days PTA so antibiotics were on hold, PVD,  status post left tibial artery angioplasty and stenting in May 2022 presented with persistent cough, chest pain. Patient recently diagnosed with COVID last week by home care nurse, starting last Friday having dry cough,  chest pain on the left side worsened with cough and movement, denies any shortness of breath or GI symptoms. Patient is admitted for further management of cough and chest pain. COVID+  Assessment & Plan:   Active Problems:   Chest pain  Acute hypoxic respiratory failure secondary to COVID+. Patient presented with chest pain and persistent cough. Chest pain has resolved.  Cough is improving with cough medication. Hypoxia has much improved.  She is now on room air,  sats 97%. Chest pain appears noncardiac in the setting of COVID-19 infection. Chest x-ray unremarkable.  Troponin negative x3.  BNP normal. Patient was hypoxic on arrival 86% on room air,  started on 2 L,  now off oxygen. Inflammatory markers slightly elevated. Completed  remdesivir and Decadron.  Continue supportive measures vitamin C,  zinc Continue airborne isolation.  D-dimer elevated, CT angiogram completed no PE noted.   Left big toe ulcer with osteomyelitis: She has completed 3 weeks of IV antibiotics before IV got infiltrated 4 days prior to arrival. Wound care consulted,  discussed with ID Dr. Charolotte Eke,  Advised to continue p.o. doxycycline and metronidazole for now and outpatient ID follow-up.  Insulin-dependent diabetes.: Blood sugar slightly elevated due to steroids. Continue sliding scale.  Hemoglobin A1c 6.9  CKD stage  IIIa: Renal functions at baseline.  Avoid nephrotoxic medication.  Continue to monitor renal functions  Hyponatremia:  Continue to monitor serum sodium levels. Sodium slightly improving 131 today. Continue salt tablets.  Essential hypertension: Continue amlodipine, losartan.  PVD with left renal artery angioplasty and stenting in May 2020: Continue aspirin, Plavix and statin  Acute on chronic deconditioning: PT and OT evaluation, SNF   Leukopenia/thrombocytopenia:   likely from COVID infection ,  Goals of care: Patient has multiple comorbidities, elderly will benefit from palliative care evaluation while hospitalized. Palliative care has reached out to the daughter 03/16/21 who wants full scope of treatment.    DVT prophylaxis: Lovenox Code Status: Full code Family Communication: No family at bedside Disposition Plan:   Status is: Inpatient  Remains inpatient appropriate because:Inpatient level of care appropriate due to severity of illness  Dispo: The patient is from: Home              Anticipated d/c is to: SNF in 1-2 days              Patient currently is not medically stable to d/c.   Difficult to place patient No  Consultants:   None  Procedures:  Antimicrobials:   Anti-infectives (From admission, onward)    Start     Dose/Rate Route Frequency Ordered Stop   03/16/21 1000  remdesivir 100 mg in sodium chloride 0.9 % 100 mL IVPB       See Hyperspace for full Linked Orders Report.   100 mg 200 mL/hr over 30 Minutes Intravenous Daily 03/15/21 1429 03/20/21 1335   03/15/21 1600  remdesivir 200 mg  in sodium chloride 0.9% 250 mL IVPB       See Hyperspace for full Linked Orders Report.   200 mg 580 mL/hr over 30 Minutes Intravenous Once 03/15/21 1429 03/15/21 1816   03/15/21 1445  metroNIDAZOLE (FLAGYL) tablet 500 mg        500 mg Oral Every 12 hours 03/15/21 1435     03/15/21 1415  doxycycline (VIBRA-TABS) tablet 100 mg        100 mg Oral Every 12 hours 03/15/21  1406     03/15/21 1415  metroNIDAZOLE (FLAGYL) tablet 500 mg  Status:  Discontinued        500 mg Oral Every 8 hours 03/15/21 1406 03/15/21 1432       Subjective: Patient was seen and examined at bedside.  Overnight events noted.   Patient is lying comfortably on the bed, denies any chest pain or shortness of breath.  She is off of home oxygen.   She is noted to have mild wheezing on exam,  otherwise breathing fine.   Objective: Vitals:   03/21/21 2319 03/22/21 0334 03/22/21 0758 03/22/21 1208  BP: (!) 114/54 136/75 (!) 141/81 115/64  Pulse: 78 79 77 79  Resp: '18 18 19 19  '$ Temp: 98.7 F (37.1 C) 98.4 F (36.9 C) 98.4 F (36.9 C) 98 F (36.7 C)  TempSrc: Oral Oral Oral Oral  SpO2: 99% 100% 100% 96%  Weight:      Height:        Intake/Output Summary (Last 24 hours) at 03/22/2021 1452 Last data filed at 03/22/2021 0900 Gross per 24 hour  Intake 270 ml  Output 1400 ml  Net -1130 ml    Filed Weights   03/14/21 1838  Weight: 76.7 kg    Examination:  General exam: Appears comfortable, not in any acute distress. Respiratory system: Mild wheezing noted on auscultation. Respiratory effort normal.  Respiratory rate 16 Cardiovascular system: S1 & S2 heard, RRR. No JVD, murmurs, rubs, gallops or clicks. No pedal edema. Gastrointestinal system: Abdomen is nondistended, soft and nontender. No organomegaly or masses felt. Normal bowel sounds heard. Central nervous system: Alert and oriented X 2 .  No focal neurological deficits. Extremities: No edema, no cyanosis, no clubbing. Skin: No rashes, lesions or ulcers, Left big toe erythematous change, but in dressing Psychiatry: Judgement and insight appear normal. Mood & affect appropriate.     Data Reviewed: I have personally reviewed following labs and imaging studies  CBC: Recent Labs  Lab 03/16/21 0131 03/17/21 0105 03/18/21 0156  WBC 6.1 3.9* 4.6  HGB 15.4* 13.9 14.3  HCT 46.7* 42.6 42.9  MCV 90.3 89.9 87.6  PLT 171  149* 0000000    Basic Metabolic Panel: Recent Labs  Lab 03/17/21 0105 03/18/21 0156 03/20/21 0024 03/21/21 0239 03/22/21 0210  NA 125* 127* 131* 132* 131*  K 4.4 4.5 4.2 3.9 3.6  CL 88* 91* 95* 96* 96*  CO2 '28 28 27 27 29  '$ GLUCOSE 138* 163* 160* 125* 120*  BUN '19 18 15 13 12  '$ CREATININE 0.85 0.78 0.86 0.71 0.69  CALCIUM 9.0 9.0 9.1 9.1 9.0    GFR: Estimated Creatinine Clearance: 39.4 mL/min (by C-G formula based on SCr of 0.69 mg/dL). Liver Function Tests: Recent Labs  Lab 03/16/21 0131 03/17/21 0105 03/18/21 0156  AST '27 22 20  '$ ALT '17 12 12  '$ ALKPHOS 51 53 51  BILITOT 0.4 0.6 0.8  PROT 6.4* 5.8* 6.3*  ALBUMIN 2.7* 2.5* 2.6*  No results for input(s): LIPASE, AMYLASE in the last 168 hours. No results for input(s): AMMONIA in the last 168 hours. Coagulation Profile: No results for input(s): INR, PROTIME in the last 168 hours. Cardiac Enzymes: No results for input(s): CKTOTAL, CKMB, CKMBINDEX, TROPONINI in the last 168 hours. BNP (last 3 results) No results for input(s): PROBNP in the last 8760 hours. HbA1C: No results for input(s): HGBA1C in the last 72 hours. CBG: Recent Labs  Lab 03/21/21 1256 03/21/21 1735 03/21/21 2044 03/22/21 0804 03/22/21 1219  GLUCAP 197* 189* 227* 97 167*    Lipid Profile: No results for input(s): CHOL, HDL, LDLCALC, TRIG, CHOLHDL, LDLDIRECT in the last 72 hours. Thyroid Function Tests: No results for input(s): TSH, T4TOTAL, FREET4, T3FREE, THYROIDAB in the last 72 hours. Anemia Panel: No results for input(s): VITAMINB12, FOLATE, FERRITIN, TIBC, IRON, RETICCTPCT in the last 72 hours. Sepsis Labs: No results for input(s): PROCALCITON, LATICACIDVEN in the last 168 hours.  Recent Results (from the past 240 hour(s))  SARS CORONAVIRUS 2 (TAT 6-24 HRS) Nasopharyngeal Nasopharyngeal Swab     Status: Abnormal   Collection Time: 03/14/21  5:43 PM   Specimen: Nasopharyngeal Swab  Result Value Ref Range Status   SARS Coronavirus 2  POSITIVE (A) NEGATIVE Final    Comment: (NOTE) SARS-CoV-2 target nucleic acids are DETECTED.  The SARS-CoV-2 RNA is generally detectable in upper and lower respiratory specimens during the acute phase of infection. Positive results are indicative of the presence of SARS-CoV-2 RNA. Clinical correlation with patient history and other diagnostic information is  necessary to determine patient infection status. Positive results do not rule out bacterial infection or co-infection with other viruses.  The expected result is Negative.  Fact Sheet for Patients: SugarRoll.be  Fact Sheet for Healthcare Providers: https://www.woods-mathews.com/  This test is not yet approved or cleared by the [place] FDA and  has been authorized for detection and/or diagnosis of SARS-CoV-2 by FDA under an Emergency Use Authorization (EUA). This EUA will remain  in effect (meaning this test can be used) for the duration of the COVID-19 declaration under Section 564(b)(1) of the Act, 21 U. S.C. section 360bbb-3(b)(1), unless the authorization is terminated or revoked sooner.   Performed at [facility], [place] [address], [employer], [place] [address]     Radiology Studies: No results found.  Scheduled Meds:  amLODipine  10 mg Oral Daily   vitamin C  500 mg Oral Daily   aspirin EC  81 mg Oral Daily   benzonatate  100 mg Oral TID   clopidogrel  75 mg Oral Daily   dexamethasone (DECADRON) injection  4 mg Intravenous Q24H   doxycycline  100 mg Oral Q12H   enoxaparin (LOVENOX) injection  40 mg Subcutaneous Q24H   famotidine  10 mg Oral BID   ferrous sulfate  325 mg Oral QHS   furosemide  20 mg Oral Daily   guaiFENesin  1,200 mg Oral BID   insulin aspart  0-15 Units Subcutaneous TID WC   lactobacillus  1 g Oral TID WC   lidocaine  1 patch Transdermal Q24H   linagliptin  5 mg Oral Daily   loratadine  10 mg Oral Daily   losartan  25 mg Oral Daily    metroNIDAZOLE  500 mg Oral Q12H   pantoprazole  40 mg Oral Daily   sodium chloride  1 g Oral TID WC   vitamin B-12  1,000 mcg Oral Q Sun   zinc sulfate  220  mg Oral Daily   Continuous Infusions:   LOS: 7 days    Time spent: 25 mins    Shawna Clamp, MD [facility]   If 7PM-7AM, please contact night-coverage

## 2021-03-22 NOTE — TOC Progression Note (Signed)
Transition of Care [facility]) - Progression Note    Patient Details  Name: ASHLEIGH SCHOENECK MRN: [id number] Date of Birth: 1921-02-08  Transition of Care [facility]) CM/SW [place], LCSW Phone Number: 03/22/2021, 3:44 PM  Clinical Narrative:    CSW provided an update to patient's daughter regarding potential discharge tomorrow.    Expected Discharge Plan: [place] Barriers to Discharge: Continued Medical Work up, SNF Covid  Expected Discharge Plan and Services Expected Discharge Plan: [place] In-house Referral: Clinical Social Work     Living arrangements for the past 2 months: Single Family Home                                       Social Determinants of Health (SDOH) Interventions    Readmission Risk Interventions No flowsheet data found.

## 2021-03-23 DIAGNOSIS — T8450XA Infection and inflammatory reaction due to unspecified internal joint prosthesis, initial encounter: Secondary | ICD-10-CM | POA: Diagnosis not present

## 2021-03-23 DIAGNOSIS — M25511 Pain in right shoulder: Secondary | ICD-10-CM | POA: Diagnosis not present

## 2021-03-23 DIAGNOSIS — L97522 Non-pressure chronic ulcer of other part of left foot with fat layer exposed: Secondary | ICD-10-CM | POA: Diagnosis not present

## 2021-03-23 DIAGNOSIS — R58 Hemorrhage, not elsewhere classified: Secondary | ICD-10-CM | POA: Diagnosis not present

## 2021-03-23 DIAGNOSIS — R2681 Unsteadiness on feet: Secondary | ICD-10-CM | POA: Diagnosis not present

## 2021-03-23 DIAGNOSIS — K219 Gastro-esophageal reflux disease without esophagitis: Secondary | ICD-10-CM | POA: Diagnosis not present

## 2021-03-23 DIAGNOSIS — I509 Heart failure, unspecified: Secondary | ICD-10-CM | POA: Diagnosis not present

## 2021-03-23 DIAGNOSIS — T148XXA Other injury of unspecified body region, initial encounter: Secondary | ICD-10-CM | POA: Diagnosis not present

## 2021-03-23 DIAGNOSIS — R2689 Other abnormalities of gait and mobility: Secondary | ICD-10-CM | POA: Diagnosis not present

## 2021-03-23 DIAGNOSIS — M869 Osteomyelitis, unspecified: Secondary | ICD-10-CM | POA: Diagnosis not present

## 2021-03-23 DIAGNOSIS — R531 Weakness: Secondary | ICD-10-CM | POA: Diagnosis not present

## 2021-03-23 DIAGNOSIS — I739 Peripheral vascular disease, unspecified: Secondary | ICD-10-CM | POA: Diagnosis not present

## 2021-03-23 DIAGNOSIS — R1312 Dysphagia, oropharyngeal phase: Secondary | ICD-10-CM | POA: Diagnosis not present

## 2021-03-23 DIAGNOSIS — E118 Type 2 diabetes mellitus with unspecified complications: Secondary | ICD-10-CM | POA: Diagnosis not present

## 2021-03-23 DIAGNOSIS — T8450XD Infection and inflammatory reaction due to unspecified internal joint prosthesis, subsequent encounter: Secondary | ICD-10-CM | POA: Diagnosis not present

## 2021-03-23 DIAGNOSIS — M1990 Unspecified osteoarthritis, unspecified site: Secondary | ICD-10-CM | POA: Diagnosis not present

## 2021-03-23 DIAGNOSIS — N183 Chronic kidney disease, stage 3 unspecified: Secondary | ICD-10-CM | POA: Diagnosis not present

## 2021-03-23 DIAGNOSIS — E1121 Type 2 diabetes mellitus with diabetic nephropathy: Secondary | ICD-10-CM | POA: Diagnosis not present

## 2021-03-23 DIAGNOSIS — I7025 Atherosclerosis of native arteries of other extremities with ulceration: Secondary | ICD-10-CM | POA: Diagnosis not present

## 2021-03-23 DIAGNOSIS — J9601 Acute respiratory failure with hypoxia: Secondary | ICD-10-CM | POA: Diagnosis not present

## 2021-03-23 DIAGNOSIS — I10 Essential (primary) hypertension: Secondary | ICD-10-CM | POA: Diagnosis not present

## 2021-03-23 DIAGNOSIS — Z7401 Bed confinement status: Secondary | ICD-10-CM | POA: Diagnosis not present

## 2021-03-23 DIAGNOSIS — U071 COVID-19: Secondary | ICD-10-CM

## 2021-03-23 DIAGNOSIS — E669 Obesity, unspecified: Secondary | ICD-10-CM | POA: Diagnosis not present

## 2021-03-23 DIAGNOSIS — K5904 Chronic idiopathic constipation: Secondary | ICD-10-CM | POA: Diagnosis not present

## 2021-03-23 DIAGNOSIS — R41841 Cognitive communication deficit: Secondary | ICD-10-CM | POA: Diagnosis not present

## 2021-03-23 DIAGNOSIS — M6281 Muscle weakness (generalized): Secondary | ICD-10-CM | POA: Diagnosis not present

## 2021-03-23 DIAGNOSIS — M19019 Primary osteoarthritis, unspecified shoulder: Secondary | ICD-10-CM | POA: Diagnosis not present

## 2021-03-23 DIAGNOSIS — M6259 Muscle wasting and atrophy, not elsewhere classified, multiple sites: Secondary | ICD-10-CM | POA: Diagnosis not present

## 2021-03-23 DIAGNOSIS — E871 Hypo-osmolality and hyponatremia: Secondary | ICD-10-CM | POA: Diagnosis not present

## 2021-03-23 DIAGNOSIS — R5381 Other malaise: Secondary | ICD-10-CM | POA: Diagnosis not present

## 2021-03-23 DIAGNOSIS — M86172 Other acute osteomyelitis, left ankle and foot: Secondary | ICD-10-CM | POA: Diagnosis not present

## 2021-03-23 DIAGNOSIS — L304 Erythema intertrigo: Secondary | ICD-10-CM | POA: Diagnosis not present

## 2021-03-23 DIAGNOSIS — M009 Pyogenic arthritis, unspecified: Secondary | ICD-10-CM | POA: Diagnosis not present

## 2021-03-23 DIAGNOSIS — E1140 Type 2 diabetes mellitus with diabetic neuropathy, unspecified: Secondary | ICD-10-CM | POA: Diagnosis not present

## 2021-03-23 LAB — GLUCOSE, CAPILLARY
Glucose-Capillary: 100 mg/dL — ABNORMAL HIGH (ref 70–99)
Glucose-Capillary: 155 mg/dL — ABNORMAL HIGH (ref 70–99)
Glucose-Capillary: 162 mg/dL — ABNORMAL HIGH (ref 70–99)

## 2021-03-23 LAB — BASIC METABOLIC PANEL
Anion gap: 8 (ref 5–15)
BUN: 13 mg/dL (ref 8–23)
CO2: 28 mmol/L (ref 22–32)
Calcium: 9.2 mg/dL (ref 8.9–10.3)
Chloride: 98 mmol/L (ref 98–111)
Creatinine, Ser: 0.74 mg/dL (ref 0.44–1.00)
GFR, Estimated: >60 mL/min (ref >60)
Glucose, Bld: 122 mg/dL — ABNORMAL HIGH (ref 70–99)
Potassium: 3.7 mmol/L (ref 3.5–5.1)
Sodium: 134 mmol/L — ABNORMAL LOW (ref 135–145)

## 2021-03-23 MED ORDER — DOXYCYCLINE HYCLATE 100 MG PO TABS: 100.0000 mg | ORAL_TABLET | Freq: Every morning | ORAL | 0 refills | Status: AC

## 2021-03-23 NOTE — Consult Note (Signed)
[facility] Inpatient Consult   03/23/2021  Heidi Dominguez Jun 04, 1921 [id number]  [facility] [[facility]]  Accountable Care Organization [ACO] Patient: Medicare CMS DCE  Patient is referred to be followed by [facility] [[facility]] Care Management PAC with traditional Medicare for any known or needs for transitional care needs for returning to post facility care or complex disease management.  For questions or referrals, please contact:   Charlesetta Shanks, RN BSN CCM [facility]  [phone] business mobile phone Toll free office [phone]  Fax number: [phone] [email or web address]    .

## 2021-03-23 NOTE — Discharge Summary (Signed)
streak artifact. Mediastinum/Nodes:  Hypodense right thyroid nodule 4.0 by 3.5 cm on image 13 series 5, roughly similar on CT cervical spine 01/13/2018. In the setting of significant comorbidities or limited life expectancy, no follow-up recommended (ref: J Am Coll Radiol. 2015 Feb;12(2): 143-50). Large hiatal hernia containing stomach. Lungs/Pleura: Bandlike scarring at the lung apices, right greater than left. Mild dependent atelectasis in both lungs. Atelectasis medially in the left lower lobe adjacent to the hiatal hernia. Upper Abdomen: Stable biliary dilatation. Musculoskeletal: Thoracic spondylosis. The right humeral head appears subluxed medially and cephalad with respect to the glenoid. Review of the MIP images confirms the above findings. IMPRESSION: 1. No embolus identified. Reduced sensitivity due to motion artifact and streak artifact from implants and arm positioning. 2. Stable 4 cm right thyroid nodule. In the setting of significant comorbidities or limited life expectancy, no follow-up recommended (ref: J Am Coll Radiol. 2015 Feb;12(2): 143-50). 3. Large hiatal hernia with adjacent atelectasis. Mild dependent atelectasis in both lungs. 4. Bandlike scarring at the lung apices. 5. Stable biliary dilatation in the upper abdomen compared to CT abdomen from 08/23/2018. 6. Bilateral humeral implants with the right humeral implant subluxed with respect to the glenoid. Electronically Signed   By: Heidi Dominguez M.D.   On: 03/16/2021 14:35   VAS [place] LOWER EXTREMITY VENOUS (DVT)  Result Date: 03/16/2021  Lower Venous DVT Study Patient Name:  Heidi Dominguez  Date of Exam:   03/16/2021 Medical Rec #: [id number]        Accession #:    [id number] Date of Birth: 1921-02-14        Patient Gender: F Patient Age:   89Y Exam Location:  [facility] Procedure:      VAS [place] LOWER EXTREMITY VENOUS (DVT) Referring Phys: [id number] Heidi Dominguez --------------------------------------------------------------------------------  Indications: Swelling, and  Edema.  Comparison Study: no prior Performing Technologist: Heidi Dominguez RVS  Examination Guidelines: Heidi Dominguez complete evaluation includes B-mode imaging, spectral Doppler, color Doppler, and power Doppler as needed of all accessible portions of each vessel. Bilateral testing is considered an integral part of Heidi Dominguez complete examination. Limited examinations for reoccurring indications may be performed as noted. The reflux portion of the exam is performed with the patient in reverse Trendelenburg.  +---------+---------------+---------+-----------+----------+-------------------+ RIGHT    CompressibilityPhasicitySpontaneityPropertiesThrombus Aging      +---------+---------------+---------+-----------+----------+-------------------+ CFV      Full           Yes      Yes                                      +---------+---------------+---------+-----------+----------+-------------------+ SFJ      Full                                                             +---------+---------------+---------+-----------+----------+-------------------+ FV Prox  Full                                                             +---------+---------------+---------+-----------+----------+-------------------+ FV Mid   Full                                                             +---------+---------------+---------+-----------+----------+-------------------+  Physician Discharge Summary  Heidi Dominguez [id number] DOB: May 21, 1921 DOA: 03/14/2021  PCP: Lajean Manes, MD  Admit date: 03/14/2021 Discharge date: 03/23/2021  Time spent: 40 minutes  Recommendations for Outpatient Follow-up:  Follow outpatient CBC/CMP Follow with ID outpatient (appt Friday, going to attempt telemedicine visit - if daughter isn't available, she's going to call to reschedule with ID) Follow hyponatremia outpatient - salt tabs d/c'd Follow continued recommendations on abx with ID outpatient  Continue isolation per guidelines -  until after 8/11 Continue wound care   Discharge Diagnoses:  Active Problems:   Chest pain   Discharge Condition: stable  Diet recommendation: heart healthy  Filed Weights   03/14/21 1838  Weight: 76.7 kg    History of present illness:  This 87 year old female with history of IDDM, chronic diastolic congestive heart failure, hypertension, CKD stage III, recent left big toe osteomyelitis on IV antibiotics 3 wks ago but IV line got infiltrated 4 days PTA so antibiotics were on hold, PVD,  status post left tibial artery angioplasty and stenting in May 2022 presented with persistent cough, chest pain. Patient recently diagnosed with COVID last week by home care nurse, starting last Friday having dry cough,  chest pain on the left side worsened with cough and movement, denies any shortness of breath or GI symptoms.  She was admitted with acute hypoxic respiratory failure 2/2 covid infection.  She's improved with steroids and remdesivir, currently stable for discharge.  Hospital Course:  Acute hypoxic respiratory failure secondary to COVID Virus Infection. Patient presented with chest pain and persistent cough. Chest pain has resolved.  Cough is improving with cough medication. Hypoxia has much improved.  She is now on room air, satting well. Chest pain appears noncardiac in the setting of COVID-19 infection. Chest x-ray unremarkable.   Troponin negative x3.  BNP normal. Patient was hypoxic on arrival 86% on room air,  started on 2 L,  now off oxygen. Inflammatory markers slightly elevated. Completed  remdesivir.  Can d/c steroids at discharge.  Continue supportive measures vitamin C,  zinc Continue airborne isolation.  D-dimer elevated, CT angiogram completed no PE noted.   COVID-19 Labs  No results for input(s): DDIMER, FERRITIN, LDH, CRP in the last 72 hours.  Lab Results  Component Value Date   SARSCOV2NAA POSITIVE (Heidi Dominguez) 03/14/2021   Collins NEGATIVE 12/22/2020   Left big toe ulcer with osteomyelitis: She has completed 3 weeks of IV antibiotics before IV got infiltrated prior to her admission and she was transitioned to PO doxycycline and metronidazole. Discussed with Dr. Juleen Dominguez on day of discharge (over phone), will try to have her do Heidi Dominguez tele visit at the SNF on Friday (if for some reason, daughter is unable to help with this, she'll call to reschedule).   Per discussion with ID today, will stop flagyl and d/c on doxycycline alone.  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   Hyponatremia: Stop salt tablets at discharge, follow up on lasix alone Follow repeat labs at SNF   Essential hypertension: Continue amlodipine, losartan.   PVD with left renal artery angioplasty and stenting in May 2020: Continue aspirin, Plavix and statin   Acute on chronic deconditioning: PT and OT evaluation, SNF   Leukopenia/thrombocytopenia:   likely from COVID infection    Goals of care: Patient has multiple  streak artifact. Mediastinum/Nodes:  Hypodense right thyroid nodule 4.0 by 3.5 cm on image 13 series 5, roughly similar on CT cervical spine 01/13/2018. In the setting of significant comorbidities or limited life expectancy, no follow-up recommended (ref: J Am Coll Radiol. 2015 Feb;12(2): 143-50). Large hiatal hernia containing stomach. Lungs/Pleura: Bandlike scarring at the lung apices, right greater than left. Mild dependent atelectasis in both lungs. Atelectasis medially in the left lower lobe adjacent to the hiatal hernia. Upper Abdomen: Stable biliary dilatation. Musculoskeletal: Thoracic spondylosis. The right humeral head appears subluxed medially and cephalad with respect to the glenoid. Review of the MIP images confirms the above findings. IMPRESSION: 1. No embolus identified. Reduced sensitivity due to motion artifact and streak artifact from implants and arm positioning. 2. Stable 4 cm right thyroid nodule. In the setting of significant comorbidities or limited life expectancy, no follow-up recommended (ref: J Am Coll Radiol. 2015 Feb;12(2): 143-50). 3. Large hiatal hernia with adjacent atelectasis. Mild dependent atelectasis in both lungs. 4. Bandlike scarring at the lung apices. 5. Stable biliary dilatation in the upper abdomen compared to CT abdomen from 08/23/2018. 6. Bilateral humeral implants with the right humeral implant subluxed with respect to the glenoid. Electronically Signed   By: Heidi Dominguez M.D.   On: 03/16/2021 14:35   VAS [place] LOWER EXTREMITY VENOUS (DVT)  Result Date: 03/16/2021  Lower Venous DVT Study Patient Name:  Heidi Dominguez  Date of Exam:   03/16/2021 Medical Rec #: [id number]        Accession #:    [id number] Date of Birth: 1921-02-14        Patient Gender: F Patient Age:   89Y Exam Location:  [facility] Procedure:      VAS [place] LOWER EXTREMITY VENOUS (DVT) Referring Phys: [id number] Heidi Dominguez --------------------------------------------------------------------------------  Indications: Swelling, and  Edema.  Comparison Study: no prior Performing Technologist: Heidi Dominguez RVS  Examination Guidelines: Heidi Dominguez complete evaluation includes B-mode imaging, spectral Doppler, color Doppler, and power Doppler as needed of all accessible portions of each vessel. Bilateral testing is considered an integral part of Heidi Dominguez complete examination. Limited examinations for reoccurring indications may be performed as noted. The reflux portion of the exam is performed with the patient in reverse Trendelenburg.  +---------+---------------+---------+-----------+----------+-------------------+ RIGHT    CompressibilityPhasicitySpontaneityPropertiesThrombus Aging      +---------+---------------+---------+-----------+----------+-------------------+ CFV      Full           Yes      Yes                                      +---------+---------------+---------+-----------+----------+-------------------+ SFJ      Full                                                             +---------+---------------+---------+-----------+----------+-------------------+ FV Prox  Full                                                             +---------+---------------+---------+-----------+----------+-------------------+ FV Mid   Full                                                             +---------+---------------+---------+-----------+----------+-------------------+  Physician Discharge Summary  Heidi Dominguez [id number] DOB: May 21, 1921 DOA: 03/14/2021  PCP: Lajean Manes, MD  Admit date: 03/14/2021 Discharge date: 03/23/2021  Time spent: 40 minutes  Recommendations for Outpatient Follow-up:  Follow outpatient CBC/CMP Follow with ID outpatient (appt Friday, going to attempt telemedicine visit - if daughter isn't available, she's going to call to reschedule with ID) Follow hyponatremia outpatient - salt tabs d/c'd Follow continued recommendations on abx with ID outpatient  Continue isolation per guidelines -  until after 8/11 Continue wound care   Discharge Diagnoses:  Active Problems:   Chest pain   Discharge Condition: stable  Diet recommendation: heart healthy  Filed Weights   03/14/21 1838  Weight: 76.7 kg    History of present illness:  This 87 year old female with history of IDDM, chronic diastolic congestive heart failure, hypertension, CKD stage III, recent left big toe osteomyelitis on IV antibiotics 3 wks ago but IV line got infiltrated 4 days PTA so antibiotics were on hold, PVD,  status post left tibial artery angioplasty and stenting in May 2022 presented with persistent cough, chest pain. Patient recently diagnosed with COVID last week by home care nurse, starting last Friday having dry cough,  chest pain on the left side worsened with cough and movement, denies any shortness of breath or GI symptoms.  She was admitted with acute hypoxic respiratory failure 2/2 covid infection.  She's improved with steroids and remdesivir, currently stable for discharge.  Hospital Course:  Acute hypoxic respiratory failure secondary to COVID Virus Infection. Patient presented with chest pain and persistent cough. Chest pain has resolved.  Cough is improving with cough medication. Hypoxia has much improved.  She is now on room air, satting well. Chest pain appears noncardiac in the setting of COVID-19 infection. Chest x-ray unremarkable.   Troponin negative x3.  BNP normal. Patient was hypoxic on arrival 86% on room air,  started on 2 L,  now off oxygen. Inflammatory markers slightly elevated. Completed  remdesivir.  Can d/c steroids at discharge.  Continue supportive measures vitamin C,  zinc Continue airborne isolation.  D-dimer elevated, CT angiogram completed no PE noted.   COVID-19 Labs  No results for input(s): DDIMER, FERRITIN, LDH, CRP in the last 72 hours.  Lab Results  Component Value Date   SARSCOV2NAA POSITIVE (Heidi Dominguez) 03/14/2021   Collins NEGATIVE 12/22/2020   Left big toe ulcer with osteomyelitis: She has completed 3 weeks of IV antibiotics before IV got infiltrated prior to her admission and she was transitioned to PO doxycycline and metronidazole. Discussed with Dr. Juleen Dominguez on day of discharge (over phone), will try to have her do Heidi Dominguez tele visit at the SNF on Friday (if for some reason, daughter is unable to help with this, she'll call to reschedule).   Per discussion with ID today, will stop flagyl and d/c on doxycycline alone.  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   Hyponatremia: Stop salt tablets at discharge, follow up on lasix alone Follow repeat labs at SNF   Essential hypertension: Continue amlodipine, losartan.   PVD with left renal artery angioplasty and stenting in May 2020: Continue aspirin, Plavix and statin   Acute on chronic deconditioning: PT and OT evaluation, SNF   Leukopenia/thrombocytopenia:   likely from COVID infection    Goals of care: Patient has multiple  streak artifact. Mediastinum/Nodes:  Hypodense right thyroid nodule 4.0 by 3.5 cm on image 13 series 5, roughly similar on CT cervical spine 01/13/2018. In the setting of significant comorbidities or limited life expectancy, no follow-up recommended (ref: J Am Coll Radiol. 2015 Feb;12(2): 143-50). Large hiatal hernia containing stomach. Lungs/Pleura: Bandlike scarring at the lung apices, right greater than left. Mild dependent atelectasis in both lungs. Atelectasis medially in the left lower lobe adjacent to the hiatal hernia. Upper Abdomen: Stable biliary dilatation. Musculoskeletal: Thoracic spondylosis. The right humeral head appears subluxed medially and cephalad with respect to the glenoid. Review of the MIP images confirms the above findings. IMPRESSION: 1. No embolus identified. Reduced sensitivity due to motion artifact and streak artifact from implants and arm positioning. 2. Stable 4 cm right thyroid nodule. In the setting of significant comorbidities or limited life expectancy, no follow-up recommended (ref: J Am Coll Radiol. 2015 Feb;12(2): 143-50). 3. Large hiatal hernia with adjacent atelectasis. Mild dependent atelectasis in both lungs. 4. Bandlike scarring at the lung apices. 5. Stable biliary dilatation in the upper abdomen compared to CT abdomen from 08/23/2018. 6. Bilateral humeral implants with the right humeral implant subluxed with respect to the glenoid. Electronically Signed   By: Heidi Dominguez M.D.   On: 03/16/2021 14:35   VAS [place] LOWER EXTREMITY VENOUS (DVT)  Result Date: 03/16/2021  Lower Venous DVT Study Patient Name:  Heidi Dominguez  Date of Exam:   03/16/2021 Medical Rec #: [id number]        Accession #:    [id number] Date of Birth: 1921-02-14        Patient Gender: F Patient Age:   89Y Exam Location:  [facility] Procedure:      VAS [place] LOWER EXTREMITY VENOUS (DVT) Referring Phys: [id number] Heidi Dominguez --------------------------------------------------------------------------------  Indications: Swelling, and  Edema.  Comparison Study: no prior Performing Technologist: Heidi Dominguez RVS  Examination Guidelines: Heidi Dominguez complete evaluation includes B-mode imaging, spectral Doppler, color Doppler, and power Doppler as needed of all accessible portions of each vessel. Bilateral testing is considered an integral part of Heidi Dominguez complete examination. Limited examinations for reoccurring indications may be performed as noted. The reflux portion of the exam is performed with the patient in reverse Trendelenburg.  +---------+---------------+---------+-----------+----------+-------------------+ RIGHT    CompressibilityPhasicitySpontaneityPropertiesThrombus Aging      +---------+---------------+---------+-----------+----------+-------------------+ CFV      Full           Yes      Yes                                      +---------+---------------+---------+-----------+----------+-------------------+ SFJ      Full                                                             +---------+---------------+---------+-----------+----------+-------------------+ FV Prox  Full                                                             +---------+---------------+---------+-----------+----------+-------------------+ FV Mid   Full                                                             +---------+---------------+---------+-----------+----------+-------------------+  streak artifact. Mediastinum/Nodes:  Hypodense right thyroid nodule 4.0 by 3.5 cm on image 13 series 5, roughly similar on CT cervical spine 01/13/2018. In the setting of significant comorbidities or limited life expectancy, no follow-up recommended (ref: J Am Coll Radiol. 2015 Feb;12(2): 143-50). Large hiatal hernia containing stomach. Lungs/Pleura: Bandlike scarring at the lung apices, right greater than left. Mild dependent atelectasis in both lungs. Atelectasis medially in the left lower lobe adjacent to the hiatal hernia. Upper Abdomen: Stable biliary dilatation. Musculoskeletal: Thoracic spondylosis. The right humeral head appears subluxed medially and cephalad with respect to the glenoid. Review of the MIP images confirms the above findings. IMPRESSION: 1. No embolus identified. Reduced sensitivity due to motion artifact and streak artifact from implants and arm positioning. 2. Stable 4 cm right thyroid nodule. In the setting of significant comorbidities or limited life expectancy, no follow-up recommended (ref: J Am Coll Radiol. 2015 Feb;12(2): 143-50). 3. Large hiatal hernia with adjacent atelectasis. Mild dependent atelectasis in both lungs. 4. Bandlike scarring at the lung apices. 5. Stable biliary dilatation in the upper abdomen compared to CT abdomen from 08/23/2018. 6. Bilateral humeral implants with the right humeral implant subluxed with respect to the glenoid. Electronically Signed   By: Heidi Dominguez M.D.   On: 03/16/2021 14:35   VAS [place] LOWER EXTREMITY VENOUS (DVT)  Result Date: 03/16/2021  Lower Venous DVT Study Patient Name:  Heidi Dominguez  Date of Exam:   03/16/2021 Medical Rec #: [id number]        Accession #:    [id number] Date of Birth: 1921-02-14        Patient Gender: F Patient Age:   89Y Exam Location:  [facility] Procedure:      VAS [place] LOWER EXTREMITY VENOUS (DVT) Referring Phys: [id number] Heidi Dominguez --------------------------------------------------------------------------------  Indications: Swelling, and  Edema.  Comparison Study: no prior Performing Technologist: Heidi Dominguez RVS  Examination Guidelines: Heidi Dominguez complete evaluation includes B-mode imaging, spectral Doppler, color Doppler, and power Doppler as needed of all accessible portions of each vessel. Bilateral testing is considered an integral part of Heidi Dominguez complete examination. Limited examinations for reoccurring indications may be performed as noted. The reflux portion of the exam is performed with the patient in reverse Trendelenburg.  +---------+---------------+---------+-----------+----------+-------------------+ RIGHT    CompressibilityPhasicitySpontaneityPropertiesThrombus Aging      +---------+---------------+---------+-----------+----------+-------------------+ CFV      Full           Yes      Yes                                      +---------+---------------+---------+-----------+----------+-------------------+ SFJ      Full                                                             +---------+---------------+---------+-----------+----------+-------------------+ FV Prox  Full                                                             +---------+---------------+---------+-----------+----------+-------------------+ FV Mid   Full                                                             +---------+---------------+---------+-----------+----------+-------------------+  Physician Discharge Summary  Heidi Dominguez [id number] DOB: May 21, 1921 DOA: 03/14/2021  PCP: Lajean Manes, MD  Admit date: 03/14/2021 Discharge date: 03/23/2021  Time spent: 40 minutes  Recommendations for Outpatient Follow-up:  Follow outpatient CBC/CMP Follow with ID outpatient (appt Friday, going to attempt telemedicine visit - if daughter isn't available, she's going to call to reschedule with ID) Follow hyponatremia outpatient - salt tabs d/c'd Follow continued recommendations on abx with ID outpatient  Continue isolation per guidelines -  until after 8/11 Continue wound care   Discharge Diagnoses:  Active Problems:   Chest pain   Discharge Condition: stable  Diet recommendation: heart healthy  Filed Weights   03/14/21 1838  Weight: 76.7 kg    History of present illness:  This 87 year old female with history of IDDM, chronic diastolic congestive heart failure, hypertension, CKD stage III, recent left big toe osteomyelitis on IV antibiotics 3 wks ago but IV line got infiltrated 4 days PTA so antibiotics were on hold, PVD,  status post left tibial artery angioplasty and stenting in May 2022 presented with persistent cough, chest pain. Patient recently diagnosed with COVID last week by home care nurse, starting last Friday having dry cough,  chest pain on the left side worsened with cough and movement, denies any shortness of breath or GI symptoms.  She was admitted with acute hypoxic respiratory failure 2/2 covid infection.  She's improved with steroids and remdesivir, currently stable for discharge.  Hospital Course:  Acute hypoxic respiratory failure secondary to COVID Virus Infection. Patient presented with chest pain and persistent cough. Chest pain has resolved.  Cough is improving with cough medication. Hypoxia has much improved.  She is now on room air, satting well. Chest pain appears noncardiac in the setting of COVID-19 infection. Chest x-ray unremarkable.   Troponin negative x3.  BNP normal. Patient was hypoxic on arrival 86% on room air,  started on 2 L,  now off oxygen. Inflammatory markers slightly elevated. Completed  remdesivir.  Can d/c steroids at discharge.  Continue supportive measures vitamin C,  zinc Continue airborne isolation.  D-dimer elevated, CT angiogram completed no PE noted.   COVID-19 Labs  No results for input(s): DDIMER, FERRITIN, LDH, CRP in the last 72 hours.  Lab Results  Component Value Date   SARSCOV2NAA POSITIVE (Heidi Dominguez) 03/14/2021   Collins NEGATIVE 12/22/2020   Left big toe ulcer with osteomyelitis: She has completed 3 weeks of IV antibiotics before IV got infiltrated prior to her admission and she was transitioned to PO doxycycline and metronidazole. Discussed with Dr. Juleen Dominguez on day of discharge (over phone), will try to have her do Heidi Dominguez tele visit at the SNF on Friday (if for some reason, daughter is unable to help with this, she'll call to reschedule).   Per discussion with ID today, will stop flagyl and d/c on doxycycline alone.  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   Hyponatremia: Stop salt tablets at discharge, follow up on lasix alone Follow repeat labs at SNF   Essential hypertension: Continue amlodipine, losartan.   PVD with left renal artery angioplasty and stenting in May 2020: Continue aspirin, Plavix and statin   Acute on chronic deconditioning: PT and OT evaluation, SNF   Leukopenia/thrombocytopenia:   likely from COVID infection    Goals of care: Patient has multiple  Physician Discharge Summary  Heidi Dominguez [id number] DOB: May 21, 1921 DOA: 03/14/2021  PCP: Lajean Manes, MD  Admit date: 03/14/2021 Discharge date: 03/23/2021  Time spent: 40 minutes  Recommendations for Outpatient Follow-up:  Follow outpatient CBC/CMP Follow with ID outpatient (appt Friday, going to attempt telemedicine visit - if daughter isn't available, she's going to call to reschedule with ID) Follow hyponatremia outpatient - salt tabs d/c'd Follow continued recommendations on abx with ID outpatient  Continue isolation per guidelines -  until after 8/11 Continue wound care   Discharge Diagnoses:  Active Problems:   Chest pain   Discharge Condition: stable  Diet recommendation: heart healthy  Filed Weights   03/14/21 1838  Weight: 76.7 kg    History of present illness:  This 87 year old female with history of IDDM, chronic diastolic congestive heart failure, hypertension, CKD stage III, recent left big toe osteomyelitis on IV antibiotics 3 wks ago but IV line got infiltrated 4 days PTA so antibiotics were on hold, PVD,  status post left tibial artery angioplasty and stenting in May 2022 presented with persistent cough, chest pain. Patient recently diagnosed with COVID last week by home care nurse, starting last Friday having dry cough,  chest pain on the left side worsened with cough and movement, denies any shortness of breath or GI symptoms.  She was admitted with acute hypoxic respiratory failure 2/2 covid infection.  She's improved with steroids and remdesivir, currently stable for discharge.  Hospital Course:  Acute hypoxic respiratory failure secondary to COVID Virus Infection. Patient presented with chest pain and persistent cough. Chest pain has resolved.  Cough is improving with cough medication. Hypoxia has much improved.  She is now on room air, satting well. Chest pain appears noncardiac in the setting of COVID-19 infection. Chest x-ray unremarkable.   Troponin negative x3.  BNP normal. Patient was hypoxic on arrival 86% on room air,  started on 2 L,  now off oxygen. Inflammatory markers slightly elevated. Completed  remdesivir.  Can d/c steroids at discharge.  Continue supportive measures vitamin C,  zinc Continue airborne isolation.  D-dimer elevated, CT angiogram completed no PE noted.   COVID-19 Labs  No results for input(s): DDIMER, FERRITIN, LDH, CRP in the last 72 hours.  Lab Results  Component Value Date   SARSCOV2NAA POSITIVE (Heidi Dominguez) 03/14/2021   [place] NEGATIVE 12/22/2020   Left big toe ulcer with osteomyelitis: She has completed 3 weeks of IV antibiotics before IV got infiltrated prior to her admission and she was transitioned to PO doxycycline and metronidazole. Discussed with Dr. Juleen Dominguez on day of discharge (over phone), will try to have her do Heidi Dominguez tele visit at the SNF on Friday (if for some reason, daughter is unable to help with this, she'll call to reschedule).   Per discussion with ID today, will stop flagyl and d/c on doxycycline alone.  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   Hyponatremia: Stop salt tablets at discharge, follow up on lasix alone Follow repeat labs at SNF   Essential hypertension: Continue amlodipine, losartan.   PVD with left renal artery angioplasty and stenting in May 2020: Continue aspirin, Plavix and statin   Acute on chronic deconditioning: PT and OT evaluation, SNF   Leukopenia/thrombocytopenia:   likely from COVID infection    Goals of care: Patient has multiple

## 2021-03-23 NOTE — TOC Transition Note (Signed)
Transition of Care [facility]) - CM/SW Discharge Note   Patient Details  Name: Heidi Dominguez MRN: [id number] Date of Birth: 01-03-1921  Transition of Care [facility]) CM/SW Contact:  Benard Halsted, LCSW Phone Number: 03/23/2021, 1:19 PM   Clinical Narrative:    Patient will DC to: [facility] Anticipated DC date: 03/23/21 Family notified: Daughter, [name] at bedside Transport by: Corey Harold   Per MD patient ready for DC to [place]. RN to call report prior to discharge [phone]). RN, patient, patient's family, and facility notified of DC. Discharge Summary and FL2 sent to facility. DC packet on chart. Ambulance transport requested for patient.   CSW will sign off for now as social work intervention is no longer needed. Please consult [place] again if new needs arise.     Final next level of care: Skilled Nursing Facility Barriers to Discharge: Barriers Resolved   Patient Goals and CMS Choice Patient states their goals for this hospitalization and ongoing recovery are:: Rehab CMS Medicare.gov Compare Post Acute Care list provided to:: Patient Represenative (must comment) Choice offered to / list presented to : Patient, Adult Children  Discharge Placement   Existing PASRR number confirmed : 03/23/21          Patient chooses bed at: [facility] Patient to be transferred to facility by: [place] Name of family member notified: Daughter, [name] Patient and family notified of of transfer: 03/23/21  Discharge Plan and Services In-house Referral: Clinical Social Work                                   Social Determinants of Health (SDOH) Interventions     Readmission Risk Interventions No flowsheet data found.

## 2021-03-23 NOTE — TOC Progression Note (Signed)
Transition of Care [facility]) - Progression Note    Patient Details  Name: Heidi Dominguez MRN: [id number] Date of Birth: Feb 01, 1921  Transition of Care [facility]) CM/SW [place], LCSW Phone Number: 03/23/2021, 9:36 AM  Clinical Narrative:    [facility] willing to accept patient today. Daughter requesting an updated on timing of transport so she can be present. CSW will update her once discharge time is determined.    Expected Discharge Plan: [place] Barriers to Discharge: Continued Medical Work up, SNF Covid  Expected Discharge Plan and Services Expected Discharge Plan: [place] In-house Referral: Clinical Social Work     Living arrangements for the past 2 months: Single Family Home                                       Social Determinants of Health (SDOH) Interventions    Readmission Risk Interventions No flowsheet data found.

## 2021-03-23 NOTE — Progress Notes (Addendum)
1710: call to facility to give report, no response  1815: 2nd call no response

## 2021-03-23 NOTE — Progress Notes (Signed)
CSW contacted [facility] and asked about wait time for pick up. CSW was alerted that patient would be picked up by 6:30pm. CSW made RN aware of communication.   Gilmore Laroche, MSW, [facility]

## 2021-03-24 DIAGNOSIS — J9601 Acute respiratory failure with hypoxia: Secondary | ICD-10-CM | POA: Diagnosis not present

## 2021-03-24 DIAGNOSIS — U071 COVID-19: Secondary | ICD-10-CM | POA: Diagnosis not present

## 2021-03-24 DIAGNOSIS — T8450XA Infection and inflammatory reaction due to unspecified internal joint prosthesis, initial encounter: Secondary | ICD-10-CM | POA: Diagnosis not present

## 2021-03-24 DIAGNOSIS — M869 Osteomyelitis, unspecified: Secondary | ICD-10-CM | POA: Diagnosis not present

## 2021-03-25 ENCOUNTER — Encounter: Payer: Self-pay | Admitting: Internal Medicine

## 2021-03-25 ENCOUNTER — Ambulatory Visit (INDEPENDENT_AMBULATORY_CARE_PROVIDER_SITE_OTHER): Payer: Medicare Other | Admitting: Internal Medicine

## 2021-03-25 ENCOUNTER — Other Ambulatory Visit: Payer: Self-pay

## 2021-03-25 VITALS — BP 137/85 | HR 86 | Temp 98.1°F | Resp 16 | Ht 64.0 in | Wt 169.0 lb

## 2021-03-25 DIAGNOSIS — T148XXA Other injury of unspecified body region, initial encounter: Secondary | ICD-10-CM | POA: Insufficient documentation

## 2021-03-25 DIAGNOSIS — M009 Pyogenic arthritis, unspecified: Secondary | ICD-10-CM | POA: Diagnosis not present

## 2021-03-25 DIAGNOSIS — M86172 Other acute osteomyelitis, left ankle and foot: Secondary | ICD-10-CM | POA: Diagnosis not present

## 2021-03-25 NOTE — Progress Notes (Signed)
[name] for Infectious Disease  CHIEF COMPLAINT:    Follow up for OM  SUBJECTIVE:    Heidi Dominguez is a 87 y.o. female with PMHx as below who presents to the clinic for osteomyelitis.   She presents today for follow up for OM.  She was hospitalized 03/14/21-03/23/21 for Covid 19.  Shortly before being admitted she developed a complication with her PICC line and this had to be removed.  She was transitioned back to doxycycline at that time and continued on oral Flagyl.  During the admission, her Cipro was also stopped and now she is only on Doxycycline.  She continued to have wound care during her admission.  Most recent ESR = 25 on 8/1, CRP = 0.9 (normal) on 8/3.  Antibiotic course: Doxycycline 01/12/21 -- 02/17/21 Daptomycin 02/18/21 -- 03/11/21 Doxycycline 7/30--present Metronidazole 01/12/21--03/23/21  Please see A&P for the details of today's visit and status of the patient's medical problems.   Patient's Medications  New Prescriptions   No medications on file  Previous Medications   ACETAMINOPHEN (TYLENOL) 650 MG CR TABLET    Take 1,300 mg by mouth every 8 (eight) hours as needed for pain.   ALBUTEROL (VENTOLIN HFA) 108 (90 BASE) MCG/ACT INHALER    Inhale 2 puffs into the lungs every 6 (six) hours as needed for wheezing or shortness of breath.   AMLODIPINE (NORVASC) 10 MG TABLET    Take 10 mg by mouth daily.   ASPIRIN EC 81 MG TABLET    Take 81 mg by mouth at bedtime.   CARBOXYMETHYLCELLULOSE SOD PF (REFRESH PLUS) 0.5 % SOLN    Place 1 drop into both eyes daily as needed (eye injections).   CETIRIZINE (ZYRTEC) 10 MG TABLET    Take 10 mg by mouth at bedtime.   CHOLECALCIFEROL (VITAMIN D-3) 125 MCG (5000 UT) TABS    Take 5,000 Units by mouth every Monday.   CLOPIDOGREL (PLAVIX) 75 MG TABLET    Take 1 tablet (75 mg total) by mouth daily.   DOXYCYCLINE (VIBRA-TABS) 100 MG TABLET    Take 1 tablet (100 mg total) by mouth every morning for 14 days.   FAMOTIDINE (PEPCID) 20 MG  TABLET    Take one after  supper   FERROUS SULFATE 325 (65 FE) MG TABLET    Take 325 mg by mouth at bedtime.   FLUCONAZOLE (DIFLUCAN) 150 MG TABLET    Take 1 tablet by mouth once. May repeat in 3 days if symptoms persist.   FUROSEMIDE (LASIX) 20 MG TABLET    Take 20 mg by mouth daily.   JANUVIA 50 MG TABLET    Take 50 mg by mouth daily.    LEVALBUTEROL (XOPENEX) 1.25 MG/3ML NEBULIZER SOLUTION    Take 1.25 mg by nebulization every 4 (four) hours as needed for shortness of breath or wheezing.   LIDOCAINE (XYLOCAINE) 5 % OINTMENT    APPLY TOPICALLY AS NEEDED. TOES FOR PAIN   LORATADINE (CLARITIN) 10 MG TABLET    1 tablet   LOSARTAN (COZAAR) 25 MG TABLET    Take 25 mg by mouth daily.   MULTIPLE VITAMIN (MULTIVITAMIN WITH MINERALS) TABS TABLET    Take 1 tablet by mouth daily. One a Day for 65 +   MULTIPLE VITAMINS-MINERALS (PRESERVISION AREDS 2) CAPS    Take 1 capsule by mouth every morning.   ONDANSETRON (ZOFRAN) 4 MG TABLET    Take 4 mg by mouth every 8 (eight)  hours as needed for nausea or vomiting.   ONETOUCH VERIO TEST STRIP    USE TO CHECK BLOOD SUGAR ONCE DAILY.   PANTOPRAZOLE (PROTONIX) 40 MG TABLET    Take 1 tablet (40 mg total) by mouth daily. Take 30-60 min before first meal of the day   POTASSIUM CHLORIDE (K-DUR) 10 MEQ TABLET    Take 10 mEq by mouth daily.   SYSTANE COMPLETE 0.6 % SOLN    Place 2 drops into both eyes 2 (two) times daily.   VITAMIN B-12 (CYANOCOBALAMIN) 1000 MCG TABLET    Take 1,000 mcg by mouth every Sunday.  Modified Medications   No medications on file  Discontinued Medications   No medications on file      Past Medical History:  Diagnosis Date   Acute pancreatitis    hx of    Arthritis    "qwhere" (07/04/2018)   Chronic back pain    "all over" (07/04/2018)   Chronic diastolic CHF (congestive heart failure) (HCC)    Chronic kidney disease    stage  3 chronic kidney disease    Complication of anesthesia    "I have a hard time waking up"   Degenerative  joint disease of shoulder region    Dry eye syndrome    Dyspnea    Family history of adverse reaction to anesthesia    "daughter has the shakes when she wakes up"   GERD (gastroesophageal reflux disease)    Heart murmur    High cholesterol    History of hiatal hernia    HOH (hard of hearing)    Hypertension    Impacted cerumen of both ears    hx of    Muscle weakness (generalized)    Osteoarthritis    Overactive bladder    Pancreatitis    hx of    Phlebitis    "BLE"   Rotator cuff tear    right    Tear of right supraspinatus tendon    Thrombocytopenia (HCC)    hx of    Type II diabetes mellitus (HCC)    Urinary tract infection    hx of    Xerosis cutis    hx of     Social History   Tobacco Use   Smoking status: Never   Smokeless tobacco: Never  Vaping Use   Vaping Use: Never used  Substance Use Topics   Alcohol use: No    Comment: rare   Drug use: No    Family History  Problem Relation Age of Onset   Hypertension Mother    Diabetes Mellitus I Mother    Hypertension Father     Allergies  Allergen Reactions   Amoxicillin-Pot Clavulanate Diarrhea   Catapres [Clonidine Hcl] Other (See Comments)    Unknown.    Codeine Other (See Comments)    "Makes me out of my head, loopy"   Hydrocodone Other (See Comments)    "makes me go out of my head, loopy"   Lisinopril Cough   Other Other (See Comments)    Any type of narcotics Feels "loopy"    Sulfa Antibiotics Itching and Other (See Comments)   Sulfamethoxazole Diarrhea   Verapamil Diarrhea and Other (See Comments)   Sulfasalazine Itching    Review of Systems  Constitutional:  Negative for chills and fever.  Musculoskeletal:  Negative for joint pain.  Endo/Heme/Allergies:  Bruises/bleeds easily.    OBJECTIVE:    Vitals:   03/25/21 0919  BP: 137/85  Pulse: 86  Resp: 16  Temp: 98.1 F (36.7 C)  TempSrc: Oral  SpO2: 96%  Weight: 169 lb (76.7 kg)  Height: 5' 4"  (1.626 m)   Body mass index is  29.01 kg/m.  Physical Exam Constitutional:      General: She is not in acute distress.    Appearance: Normal appearance.  Pulmonary:     Effort: Pulmonary effort is normal. No respiratory distress.  Abdominal:     Comments: Photo of abdomen shows bruising likely from SQ DVT prophylaxis in the hospital.  Musculoskeletal:     Comments: See picture of toe.  Left shin with small area of bruising.  No warmth, erythema.  Neurological:     General: No focal deficit present.     Mental Status: She is alert and oriented to person, place, and time.  Psychiatric:        Mood and Affect: Mood normal.        Behavior: Behavior normal.      Labs and Microbiology: CBC Latest Ref Rng & Units 03/18/2021 03/17/2021 03/16/2021  WBC 4.0 - 10.5 K/uL 4.6 3.9(L) 6.1  Hemoglobin 12.0 - 15.0 g/dL 14.3 13.9 15.4(H)  Hematocrit 36.0 - 46.0 % 42.9 42.6 46.7(H)  Platelets 150 - 400 K/uL 158 149(L) 171   CMP Latest Ref Rng & Units 03/23/2021 03/22/2021 03/21/2021  Glucose 70 - 99 mg/dL 122(H) 120(H) 125(H)  BUN 8 - 23 mg/dL 13 12 13   Creatinine 0.44 - 1.00 mg/dL 0.74 0.69 0.71  Sodium 135 - 145 mmol/L 134(L) 131(L) 132(L)  Potassium 3.5 - 5.1 mmol/L 3.7 3.6 3.9  Chloride 98 - 111 mmol/L 98 96(L) 96(L)  CO2 22 - 32 mmol/L 28 29 27   Calcium 8.9 - 10.3 mg/dL 9.2 9.0 9.1  Total Protein 6.5 - 8.1 g/dL - - -  Total Bilirubin 0.3 - 1.2 mg/dL - - -  Alkaline Phos 38 - 126 U/L - - -  AST 15 - 41 U/L - - -  ALT 0 - 44 U/L - - -       ASSESSMENT & PLAN:    Osteomyelitis ([place]) She has now completed about 10.5 weeks of antibiotics as noted in the HPI for MRSA osteo of the great toe.  Her inflammatory markers most recently were essentially normalized and wound has shown healing as well at this time.  Will go ahead and continue with doxycycline PO for 2 more weeks at this time which will complete 12 full weeks for osteo due to MRSA without any formal debridement.  Will then have her stop antibiotics and return a few  weeks later and reassess how she is doing with repeat inflammatory markers at that time.    Also, her cipro was stopped recently for her hx of shoulder infection and she is doing well at this time.  I will discuss with her surgeon, Dr Veverly Fells @ [facility], regarding this to ensure he is okay with this change.   Bruising She has a small bruise on her left shin and some bruising on abdomen that is likely from SQ Lovenox in the hospital.  Will check CBC and PT/INR today to ensure no issues at request of her daughter.   Orders Placed This Encounter  Procedures   Protime-INR   CBC   Sedimentation rate   C-reactive protein         Raynelle [facility] for Infectious Disease [facility] 03/25/2021, 9:51 AM   I spent 40  minutes dedicated to the care of this patient on the date of this encounter to include pre-visit review of records, face-to-face time with the patient discussing osteo, bruising, shoulder infectoin, and post-visit ordering of testing.

## 2021-03-25 NOTE — Assessment & Plan Note (Signed)
She has a small bruise on her left shin and some bruising on abdomen that is likely from SQ Lovenox in the hospital.  Will check CBC and PT/INR today to ensure no issues at request of her daughter.

## 2021-03-25 NOTE — Assessment & Plan Note (Signed)
She has now completed about 10.5 weeks of antibiotics as noted in the HPI for MRSA osteo of the great toe.  Her inflammatory markers most recently were essentially normalized and wound has shown healing as well at this time.  Will go ahead and continue with doxycycline PO for 2 more weeks at this time which will complete 12 full weeks for osteo due to MRSA without any formal debridement.  Will then have her stop antibiotics and return a few weeks later and reassess how she is doing with repeat inflammatory markers at that time.    Also, her cipro was stopped recently for her hx of shoulder infection and she is doing well at this time.  I will discuss with her surgeon, Dr Veverly Fells @ [facility], regarding this to ensure he is okay with this change.

## 2021-03-25 NOTE — Patient Instructions (Signed)
Thank you for coming to see me today. It was a pleasure seeing you.  To Do: Labs today Continue wound care at rehab Continue Doxycycline '100mg'$  twice daily for 2 more weeks then stop and follow up with me in 6 weeks  If you have any questions or concerns, please do not hesitate to call the office at [phone].  Take Care,   Jule Ser, DO

## 2021-03-26 LAB — CBC
HCT: 53.5 % — ABNORMAL HIGH (ref 35.0–45.0)
Hemoglobin: 17.6 g/dL — ABNORMAL HIGH (ref 11.7–15.5)
MCH: 29.4 pg (ref 27.0–33.0)
MCHC: 32.9 g/dL (ref 32.0–36.0)
MCV: 89.5 fL (ref 80.0–100.0)
MPV: 12.2 fL (ref 7.5–12.5)
Platelets: 246 10*3/uL (ref 140–400)
RBC: 5.98 10*6/uL — ABNORMAL HIGH (ref 3.80–5.10)
RDW: 13.5 % (ref 11.0–15.0)
WBC: 11.3 10*3/uL — ABNORMAL HIGH (ref 3.8–10.8)

## 2021-03-26 LAB — SEDIMENTATION RATE: Sed Rate: 2 mm/h (ref 0–30)

## 2021-03-26 LAB — C-REACTIVE PROTEIN: CRP: 6.3 mg/L (ref <8.0)

## 2021-03-26 LAB — PROTIME-INR

## 2021-03-28 ENCOUNTER — Telehealth: Payer: Self-pay

## 2021-03-28 DIAGNOSIS — N183 Chronic kidney disease, stage 3 unspecified: Secondary | ICD-10-CM | POA: Diagnosis not present

## 2021-03-28 DIAGNOSIS — I509 Heart failure, unspecified: Secondary | ICD-10-CM | POA: Diagnosis not present

## 2021-03-28 DIAGNOSIS — R2681 Unsteadiness on feet: Secondary | ICD-10-CM | POA: Diagnosis not present

## 2021-03-28 DIAGNOSIS — U071 COVID-19: Secondary | ICD-10-CM | POA: Diagnosis not present

## 2021-03-28 DIAGNOSIS — K219 Gastro-esophageal reflux disease without esophagitis: Secondary | ICD-10-CM | POA: Diagnosis not present

## 2021-03-28 DIAGNOSIS — E669 Obesity, unspecified: Secondary | ICD-10-CM | POA: Diagnosis not present

## 2021-03-28 DIAGNOSIS — M86172 Other acute osteomyelitis, left ankle and foot: Secondary | ICD-10-CM | POA: Diagnosis not present

## 2021-03-28 DIAGNOSIS — E1140 Type 2 diabetes mellitus with diabetic neuropathy, unspecified: Secondary | ICD-10-CM | POA: Diagnosis not present

## 2021-03-28 DIAGNOSIS — M6281 Muscle weakness (generalized): Secondary | ICD-10-CM | POA: Diagnosis not present

## 2021-03-28 DIAGNOSIS — I10 Essential (primary) hypertension: Secondary | ICD-10-CM | POA: Diagnosis not present

## 2021-03-28 NOTE — Telephone Encounter (Signed)
Received notification that protime INR could not be processed due to hemolysis. Will route to provider.   [name], RN

## 2021-03-28 NOTE — Telephone Encounter (Signed)
Relayed results to patient's daughter. No questions at this time. Will follow up on 9/23.

## 2021-03-28 NOTE — Telephone Encounter (Signed)
Left HIPAA compliant voicemail requesting callback.   Michelina Mexicano D Lisseth Brazeau, RN  

## 2021-03-28 NOTE — Telephone Encounter (Signed)
-----   Message from Mignon Pine, DO sent at 03/28/2021 11:28 AM EDT ----- Can you please let pts daughter know that her inflammatory markers were normal.  CBC showed that her hemoglobin was not low so unlikely to be bleeding as she had concerns about that.  Everything looks a little concentrated so her mom may be slightly dehydrated and should encourage fluid intake.  Otherwise, the PT/INR test to assess coagulation that they requested was not run due to hemolysis from being transported to the lab.  I do not think she needs to return to have this done especially since hemoglobin is not low, however, if they are that concerned I am sure this can be checked at her SNF.  Thanks

## 2021-03-29 DIAGNOSIS — U071 COVID-19: Secondary | ICD-10-CM | POA: Diagnosis not present

## 2021-03-29 DIAGNOSIS — J9601 Acute respiratory failure with hypoxia: Secondary | ICD-10-CM | POA: Diagnosis not present

## 2021-03-29 DIAGNOSIS — M869 Osteomyelitis, unspecified: Secondary | ICD-10-CM | POA: Diagnosis not present

## 2021-03-29 DIAGNOSIS — K5904 Chronic idiopathic constipation: Secondary | ICD-10-CM | POA: Diagnosis not present

## 2021-03-29 DIAGNOSIS — L97522 Non-pressure chronic ulcer of other part of left foot with fat layer exposed: Secondary | ICD-10-CM | POA: Diagnosis not present

## 2021-03-31 ENCOUNTER — Other Ambulatory Visit: Payer: Self-pay | Admitting: *Deleted

## 2021-03-31 DIAGNOSIS — E669 Obesity, unspecified: Secondary | ICD-10-CM | POA: Diagnosis not present

## 2021-03-31 DIAGNOSIS — M86172 Other acute osteomyelitis, left ankle and foot: Secondary | ICD-10-CM | POA: Diagnosis not present

## 2021-03-31 DIAGNOSIS — K219 Gastro-esophageal reflux disease without esophagitis: Secondary | ICD-10-CM | POA: Diagnosis not present

## 2021-03-31 DIAGNOSIS — E1140 Type 2 diabetes mellitus with diabetic neuropathy, unspecified: Secondary | ICD-10-CM | POA: Diagnosis not present

## 2021-03-31 DIAGNOSIS — U071 COVID-19: Secondary | ICD-10-CM | POA: Diagnosis not present

## 2021-03-31 DIAGNOSIS — N183 Chronic kidney disease, stage 3 unspecified: Secondary | ICD-10-CM | POA: Diagnosis not present

## 2021-03-31 DIAGNOSIS — I509 Heart failure, unspecified: Secondary | ICD-10-CM | POA: Diagnosis not present

## 2021-03-31 DIAGNOSIS — M6281 Muscle weakness (generalized): Secondary | ICD-10-CM | POA: Diagnosis not present

## 2021-03-31 DIAGNOSIS — R2681 Unsteadiness on feet: Secondary | ICD-10-CM | POA: Diagnosis not present

## 2021-03-31 DIAGNOSIS — I10 Essential (primary) hypertension: Secondary | ICD-10-CM | POA: Diagnosis not present

## 2021-03-31 NOTE — Patient Outreach (Signed)
Member screened for potential [facility] Care Management needs. Mrs. Ferrelli resides in [facility].   Update received from [place] indicating transition plan is pending member's progress.  Will continue to follow for potential [facility] Care Management services.    Marthenia Rolling, MSN, RN,BSN [place] Acute Care Coordinator [phone] [facility]) [phone]  (Toll free office)

## 2021-04-01 ENCOUNTER — Encounter (HOSPITAL_BASED_OUTPATIENT_CLINIC_OR_DEPARTMENT_OTHER): Payer: Medicare Other | Admitting: Internal Medicine

## 2021-04-04 DIAGNOSIS — M6281 Muscle weakness (generalized): Secondary | ICD-10-CM | POA: Diagnosis not present

## 2021-04-04 DIAGNOSIS — E669 Obesity, unspecified: Secondary | ICD-10-CM | POA: Diagnosis not present

## 2021-04-04 DIAGNOSIS — E1140 Type 2 diabetes mellitus with diabetic neuropathy, unspecified: Secondary | ICD-10-CM | POA: Diagnosis not present

## 2021-04-04 DIAGNOSIS — K219 Gastro-esophageal reflux disease without esophagitis: Secondary | ICD-10-CM | POA: Diagnosis not present

## 2021-04-04 DIAGNOSIS — N183 Chronic kidney disease, stage 3 unspecified: Secondary | ICD-10-CM | POA: Diagnosis not present

## 2021-04-04 DIAGNOSIS — R2681 Unsteadiness on feet: Secondary | ICD-10-CM | POA: Diagnosis not present

## 2021-04-04 DIAGNOSIS — M86172 Other acute osteomyelitis, left ankle and foot: Secondary | ICD-10-CM | POA: Diagnosis not present

## 2021-04-04 DIAGNOSIS — I509 Heart failure, unspecified: Secondary | ICD-10-CM | POA: Diagnosis not present

## 2021-04-04 DIAGNOSIS — U071 COVID-19: Secondary | ICD-10-CM | POA: Diagnosis not present

## 2021-04-04 DIAGNOSIS — I10 Essential (primary) hypertension: Secondary | ICD-10-CM | POA: Diagnosis not present

## 2021-04-05 DIAGNOSIS — L97522 Non-pressure chronic ulcer of other part of left foot with fat layer exposed: Secondary | ICD-10-CM | POA: Diagnosis not present

## 2021-04-06 ENCOUNTER — Telehealth: Payer: Self-pay

## 2021-04-06 DIAGNOSIS — R58 Hemorrhage, not elsewhere classified: Secondary | ICD-10-CM | POA: Diagnosis not present

## 2021-04-06 DIAGNOSIS — I739 Peripheral vascular disease, unspecified: Secondary | ICD-10-CM | POA: Diagnosis not present

## 2021-04-06 NOTE — Telephone Encounter (Signed)
Patient's daughter calls today to ask about bruising related to Aspirin/Plavix. Advised it was probable that they were related, but to keep the tibial stent open she would need to continue on them. Instructed to call back if patient developed severe nose bleeds or black tarry stools. She verbalized understanding and will have patient keep appt in September.

## 2021-04-09 DIAGNOSIS — R2681 Unsteadiness on feet: Secondary | ICD-10-CM | POA: Diagnosis not present

## 2021-04-09 DIAGNOSIS — I10 Essential (primary) hypertension: Secondary | ICD-10-CM | POA: Diagnosis not present

## 2021-04-09 DIAGNOSIS — N183 Chronic kidney disease, stage 3 unspecified: Secondary | ICD-10-CM | POA: Diagnosis not present

## 2021-04-09 DIAGNOSIS — U071 COVID-19: Secondary | ICD-10-CM | POA: Diagnosis not present

## 2021-04-09 DIAGNOSIS — E669 Obesity, unspecified: Secondary | ICD-10-CM | POA: Diagnosis not present

## 2021-04-09 DIAGNOSIS — I509 Heart failure, unspecified: Secondary | ICD-10-CM | POA: Diagnosis not present

## 2021-04-09 DIAGNOSIS — M6281 Muscle weakness (generalized): Secondary | ICD-10-CM | POA: Diagnosis not present

## 2021-04-09 DIAGNOSIS — M86172 Other acute osteomyelitis, left ankle and foot: Secondary | ICD-10-CM | POA: Diagnosis not present

## 2021-04-09 DIAGNOSIS — E1140 Type 2 diabetes mellitus with diabetic neuropathy, unspecified: Secondary | ICD-10-CM | POA: Diagnosis not present

## 2021-04-09 DIAGNOSIS — K219 Gastro-esophageal reflux disease without esophagitis: Secondary | ICD-10-CM | POA: Diagnosis not present

## 2021-04-12 DIAGNOSIS — L97522 Non-pressure chronic ulcer of other part of left foot with fat layer exposed: Secondary | ICD-10-CM | POA: Diagnosis not present

## 2021-04-13 ENCOUNTER — Ambulatory Visit: Payer: Medicare Other | Admitting: Internal Medicine

## 2021-04-14 DIAGNOSIS — I509 Heart failure, unspecified: Secondary | ICD-10-CM | POA: Diagnosis not present

## 2021-04-14 DIAGNOSIS — E1140 Type 2 diabetes mellitus with diabetic neuropathy, unspecified: Secondary | ICD-10-CM | POA: Diagnosis not present

## 2021-04-14 DIAGNOSIS — K219 Gastro-esophageal reflux disease without esophagitis: Secondary | ICD-10-CM | POA: Diagnosis not present

## 2021-04-14 DIAGNOSIS — N183 Chronic kidney disease, stage 3 unspecified: Secondary | ICD-10-CM | POA: Diagnosis not present

## 2021-04-14 DIAGNOSIS — U071 COVID-19: Secondary | ICD-10-CM | POA: Diagnosis not present

## 2021-04-14 DIAGNOSIS — E669 Obesity, unspecified: Secondary | ICD-10-CM | POA: Diagnosis not present

## 2021-04-14 DIAGNOSIS — M86172 Other acute osteomyelitis, left ankle and foot: Secondary | ICD-10-CM | POA: Diagnosis not present

## 2021-04-14 DIAGNOSIS — M6281 Muscle weakness (generalized): Secondary | ICD-10-CM | POA: Diagnosis not present

## 2021-04-14 DIAGNOSIS — I10 Essential (primary) hypertension: Secondary | ICD-10-CM | POA: Diagnosis not present

## 2021-04-14 DIAGNOSIS — R2681 Unsteadiness on feet: Secondary | ICD-10-CM | POA: Diagnosis not present

## 2021-04-21 DIAGNOSIS — U071 COVID-19: Secondary | ICD-10-CM | POA: Diagnosis not present

## 2021-04-21 DIAGNOSIS — I509 Heart failure, unspecified: Secondary | ICD-10-CM | POA: Diagnosis not present

## 2021-04-21 DIAGNOSIS — M6281 Muscle weakness (generalized): Secondary | ICD-10-CM | POA: Diagnosis not present

## 2021-04-21 DIAGNOSIS — M86172 Other acute osteomyelitis, left ankle and foot: Secondary | ICD-10-CM | POA: Diagnosis not present

## 2021-04-21 DIAGNOSIS — K219 Gastro-esophageal reflux disease without esophagitis: Secondary | ICD-10-CM | POA: Diagnosis not present

## 2021-04-21 DIAGNOSIS — E1140 Type 2 diabetes mellitus with diabetic neuropathy, unspecified: Secondary | ICD-10-CM | POA: Diagnosis not present

## 2021-04-21 DIAGNOSIS — N183 Chronic kidney disease, stage 3 unspecified: Secondary | ICD-10-CM | POA: Diagnosis not present

## 2021-04-21 DIAGNOSIS — E669 Obesity, unspecified: Secondary | ICD-10-CM | POA: Diagnosis not present

## 2021-04-21 DIAGNOSIS — R2681 Unsteadiness on feet: Secondary | ICD-10-CM | POA: Diagnosis not present

## 2021-04-21 DIAGNOSIS — I10 Essential (primary) hypertension: Secondary | ICD-10-CM | POA: Diagnosis not present

## 2021-04-22 DIAGNOSIS — N183 Chronic kidney disease, stage 3 unspecified: Secondary | ICD-10-CM | POA: Diagnosis not present

## 2021-04-22 DIAGNOSIS — M869 Osteomyelitis, unspecified: Secondary | ICD-10-CM | POA: Diagnosis not present

## 2021-04-22 DIAGNOSIS — I739 Peripheral vascular disease, unspecified: Secondary | ICD-10-CM | POA: Diagnosis not present

## 2021-04-22 DIAGNOSIS — L304 Erythema intertrigo: Secondary | ICD-10-CM | POA: Diagnosis not present

## 2021-04-25 DIAGNOSIS — E871 Hypo-osmolality and hyponatremia: Secondary | ICD-10-CM | POA: Diagnosis not present

## 2021-04-25 DIAGNOSIS — U071 COVID-19: Secondary | ICD-10-CM | POA: Diagnosis not present

## 2021-04-25 DIAGNOSIS — M869 Osteomyelitis, unspecified: Secondary | ICD-10-CM | POA: Diagnosis not present

## 2021-04-25 DIAGNOSIS — N183 Chronic kidney disease, stage 3 unspecified: Secondary | ICD-10-CM | POA: Diagnosis not present

## 2021-04-26 DIAGNOSIS — L97522 Non-pressure chronic ulcer of other part of left foot with fat layer exposed: Secondary | ICD-10-CM | POA: Diagnosis not present

## 2021-04-28 ENCOUNTER — Telehealth: Payer: Self-pay

## 2021-04-28 NOTE — Telephone Encounter (Signed)
Patient's daughter called stating patient is having pain in the shoulder and arm x 1 week and shoulder is warm to the touch. Patient's daughter is requesting that the patient be seen in the office and have labs done. Patient daughter stated she is afraid the infection is back in the patient's arm.  I also spoke with Quillian Quince patient's nurse at [facility] and he stated patient has not made him aware of any pain. Patient does not have any recent labs with them either. I have given Quillian Quince verbal orders to draw CBC, CRP, sed rate and CMP and fax results to our office.  Jerrica Thorman T Brooks Sailors

## 2021-04-29 DIAGNOSIS — T8450XD Infection and inflammatory reaction due to unspecified internal joint prosthesis, subsequent encounter: Secondary | ICD-10-CM | POA: Diagnosis not present

## 2021-04-29 DIAGNOSIS — M25511 Pain in right shoulder: Secondary | ICD-10-CM | POA: Diagnosis not present

## 2021-04-29 NOTE — Telephone Encounter (Signed)
Thank you.   Can you let daughter know that WBC is normal as is her hemoglobin and platelets.  Creatinine, electrolytes and liver enzymes also normal.  Still waiting on inflammatory markers to be faxed.   Thanks

## 2021-04-29 NOTE — Telephone Encounter (Signed)
CBC and CMP are the only labs that came over today and are in your box

## 2021-05-03 ENCOUNTER — Ambulatory Visit (INDEPENDENT_AMBULATORY_CARE_PROVIDER_SITE_OTHER): Payer: Medicare Other | Admitting: Physician Assistant

## 2021-05-03 ENCOUNTER — Ambulatory Visit (INDEPENDENT_AMBULATORY_CARE_PROVIDER_SITE_OTHER)
Admission: RE | Admit: 2021-05-03 | Discharge: 2021-05-03 | Disposition: A | Discharge status: Home / Self Care | Payer: No Typology Code available for payment source | Source: Ambulatory Visit | Attending: Vascular Surgery | Admitting: Vascular Surgery

## 2021-05-03 ENCOUNTER — Other Ambulatory Visit: Payer: Self-pay

## 2021-05-03 ENCOUNTER — Ambulatory Visit (HOSPITAL_COMMUNITY)
Admission: RE | Admit: 2021-05-03 | Discharge: 2021-05-03 | Disposition: A | Discharge status: Home / Self Care | Payer: No Typology Code available for payment source | Source: Ambulatory Visit | Attending: Vascular Surgery | Admitting: Vascular Surgery

## 2021-05-03 VITALS — BP 161/85 | HR 87 | Temp 98.0°F | Resp 20 | Ht 64.0 in | Wt 156.0 lb

## 2021-05-03 DIAGNOSIS — I7025 Atherosclerosis of native arteries of other extremities with ulceration: Secondary | ICD-10-CM | POA: Diagnosis not present

## 2021-05-03 DIAGNOSIS — I739 Peripheral vascular disease, unspecified: Secondary | ICD-10-CM | POA: Diagnosis not present

## 2021-05-03 NOTE — Telephone Encounter (Signed)
I attempted to contact patient's daughter to relay lab results. Patient's daughter did not answer.

## 2021-05-03 NOTE — Progress Notes (Signed)
HISTORY AND PHYSICAL     CC:  follow up. Requesting Provider:  Lajean Manes, MD  HPI: This is a 87 y.o. female who is here today for follow up for PAD.  She presented to the hospital in May with a nonhealing left great toe ulcer that had been present for about a year.  She was receiving local wound care from podiatry but this became more painful and vascular surgery was consulted.  On 12/22/2020, she was taken to the [facility] lab and underwent left ATA stenting and angioplasty and placed on Plavix.  By POD 1, she was doing well with a palpable left  ATA pulse.   Pt was last seen June 2022 and at that time, the pain in her left great toe was greatly improved.  She was going to the wound center.  The ABI that day was unreliable but duplex showed adequate perfusion of the LLE and a patent ATA.  She was scheduled for 3 month follow up.    The pt returns today for follow up accompanied by her daughter.  Pt states she is doing well and her left foot feels much better.  Her daughter is pleased that the wound is almost healed.  She continues to take her asa/plavix.  She states that they are cleaning it off every other day with betadine and placing bandage.  She is currently living at [facility].  They are still trying to determine if this will be permanent or if she will go home with 24 hour care.    The pt is not on a statin for cholesterol management.    The pt is on an aspirin.    Other AC:  Plavix The pt is on ARB for hypertension.  The pt does have diabetes. Tobacco hx:  never    Past Medical History:  Diagnosis Date   Acute pancreatitis    hx of    Arthritis    "qwhere" (07/04/2018)   Chronic back pain    "all over" (07/04/2018)   Chronic diastolic CHF (congestive heart failure) (HCC)    Chronic kidney disease    stage  3 chronic kidney disease    Complication of anesthesia    "I have a hard time waking up"   Degenerative joint disease of shoulder region    Dry eye syndrome     Dyspnea    Family history of adverse reaction to anesthesia    "daughter has the shakes when she wakes up"   GERD (gastroesophageal reflux disease)    Heart murmur    High cholesterol    History of hiatal hernia    HOH (hard of hearing)    Hypertension    Impacted cerumen of both ears    hx of    Muscle weakness (generalized)    Osteoarthritis    Overactive bladder    Pancreatitis    hx of    Phlebitis    "BLE"   Rotator cuff tear    right    Tear of right supraspinatus tendon    Thrombocytopenia (HCC)    hx of    Type II diabetes mellitus (HCC)    Urinary tract infection    hx of    Xerosis cutis    hx of     Past Surgical History:  Procedure Laterality Date   ABDOMINAL AORTOGRAM W/LOWER EXTREMITY N/A 12/22/2020   Procedure: ABDOMINAL AORTOGRAM W/LOWER EXTREMITY;  Surgeon: Cherre Robins, MD;  Location: [facility];  Service: Cardiovascular;  Laterality: N/A;   ABDOMINAL HYSTERECTOMY     BACK SURGERY     CATARACT EXTRACTION W/ INTRAOCULAR LENS  IMPLANT, BILATERAL Bilateral    CHOLECYSTECTOMY  2016   ERCP N/A 08/30/2018   Procedure: ENDOSCOPIC RETROGRADE CHOLANGIOPANCREATOGRAPHY (ERCP);  Surgeon: Clarene Essex, MD;  Location: [facility];  Service: Endoscopy;  Laterality: N/A;   FIXATION KYPHOPLASTY     INCISION AND DRAINAGE Right 11/08/2018   Procedure: INCISION AND DRAINAGE right shoulder, placement of antibiotic beads ;  Surgeon: [name], MD;  Location: [facility];  Service: Orthopedics;  Laterality: Right;   JOINT REPLACEMENT     LAPAROSCOPIC CHOLECYSTECTOMY SINGLE SITE WITH INTRAOPERATIVE CHOLANGIOGRAM N/A 04/11/2015   Procedure: LAPAROSCOPIC LYSIS OF ADHESIONS, LAPAROSCOPIC CHOLECYSTECTOMY WITH INTRAOPERATIVE CHOLANGIOGRAM;  Surgeon: Michael Boston, MD;  Location: [facility];  Service: General;  Laterality: N/A;   PANCREATIC STENT PLACEMENT  08/30/2018   Procedure: PANCREATIC STENT PLACEMENT;  Surgeon: Clarene Essex, MD;  Location: [facility];  Service: Endoscopy;;    PERIPHERAL VASCULAR INTERVENTION  12/22/2020   Procedure: PERIPHERAL VASCULAR INTERVENTION;  Surgeon: Cherre Robins, MD;  Location: [facility];  Service: Cardiovascular;;  left anterior tibial artery   REMOVAL OF STONES  08/30/2018   Procedure: REMOVAL OF STONES;  Surgeon: Clarene Essex, MD;  Location: [facility];  Service: Endoscopy;;   SHOULDER OPEN ROTATOR CUFF REPAIR Bilateral    SPHINCTEROTOMY  08/30/2018   Procedure: SPHINCTEROTOMY;  Surgeon: Clarene Essex, MD;  Location: [facility];  Service: Endoscopy;;   TOTAL KNEE ARTHROPLASTY Bilateral     Allergies  Allergen Reactions   Amoxicillin-Pot Clavulanate Diarrhea   Catapres [Clonidine Hcl] Other (See Comments)    Unknown.    Codeine Other (See Comments)    "Makes me out of my head, loopy"   Hydrocodone Other (See Comments)    "makes me go out of my head, loopy"   Lisinopril Cough   Other Other (See Comments)    Any type of narcotics Feels "loopy"    Sulfa Antibiotics Itching and Other (See Comments)   Sulfamethoxazole Diarrhea   Verapamil Diarrhea and Other (See Comments)   Sulfasalazine Itching    Current Outpatient Medications  Medication Sig Dispense Refill   acetaminophen (TYLENOL) 650 MG CR tablet Take 1,300 mg by mouth every 8 (eight) hours as needed for pain.     albuterol (VENTOLIN HFA) 108 (90 Base) MCG/ACT inhaler Inhale 2 puffs into the lungs every 6 (six) hours as needed for wheezing or shortness of breath.     amLODipine (NORVASC) 10 MG tablet Take 10 mg by mouth daily.     aspirin EC 81 MG tablet Take 81 mg by mouth at bedtime.     Carboxymethylcellulose Sod PF (REFRESH PLUS) 0.5 % SOLN Place 1 drop into both eyes daily as needed (eye injections).     cetirizine (ZYRTEC) 10 MG tablet Take 10 mg by mouth at bedtime.     Cholecalciferol (VITAMIN D-3) 125 MCG (5000 UT) TABS Take 5,000 Units by mouth every Monday.     clopidogrel (PLAVIX) 75 MG tablet Take 1 tablet (75 mg total) by mouth daily. 90  tablet 3   famotidine (PEPCID) 20 MG tablet Take one after  supper 30 tablet 2   ferrous sulfate 325 (65 FE) MG tablet Take 325 mg by mouth at bedtime.     fluconazole (DIFLUCAN) 150 MG tablet Take 1 tablet by mouth once. May repeat in 3 days if symptoms persist. 2 tablet 0  furosemide (LASIX) 20 MG tablet Take 20 mg by mouth daily.     JANUVIA 50 MG tablet Take 50 mg by mouth daily.   5   levalbuterol (XOPENEX) 1.25 MG/3ML nebulizer solution Take 1.25 mg by nebulization every 4 (four) hours as needed for shortness of breath or wheezing.     lidocaine (XYLOCAINE) 5 % ointment APPLY TOPICALLY AS NEEDED. TOES FOR PAIN 35.44 g 0   loratadine (CLARITIN) 10 MG tablet 1 tablet     losartan (COZAAR) 25 MG tablet Take 25 mg by mouth daily.     Multiple Vitamin (MULTIVITAMIN WITH MINERALS) TABS tablet Take 1 tablet by mouth daily. One a Day for 65 +     Multiple Vitamins-Minerals (PRESERVISION AREDS 2) CAPS Take 1 capsule by mouth every morning.     ondansetron (ZOFRAN) 4 MG tablet Take 4 mg by mouth every 8 (eight) hours as needed for nausea or vomiting.     ONETOUCH VERIO test strip USE TO CHECK BLOOD SUGAR ONCE DAILY.     pantoprazole (PROTONIX) 40 MG tablet Take 1 tablet (40 mg total) by mouth daily. Take 30-60 min before first meal of the day 30 tablet 5   potassium chloride (K-DUR) 10 MEQ tablet Take 10 mEq by mouth daily.     SYSTANE COMPLETE 0.6 % SOLN Place 2 drops into both eyes 2 (two) times daily.     vitamin B-12 (CYANOCOBALAMIN) 1000 MCG tablet Take 1,000 mcg by mouth every Sunday.     No current facility-administered medications for this visit.    Family History  Problem Relation Age of Onset   Hypertension Mother    Diabetes Mellitus I Mother    Hypertension Father     Social History   Socioeconomic History   Marital status: Widowed    Spouse name: Not on file   Number of children: Not on file   Years of education: Not on file   Highest education level: Not on file   Occupational History   Occupation: retired  Tobacco Use   Smoking status: Never   Smokeless tobacco: Never  Vaping Use   Vaping Use: Never used  Substance and Sexual Activity   Alcohol use: No    Comment: rare   Drug use: No   Sexual activity: Not on file  Other Topics Concern   Not on file  Social History Narrative   Not on file   Social Determinants of Health   Financial Resource Strain: Not on file  Food Insecurity: Not on file  Transportation Needs: Not on file  Physical Activity: Not on file  Stress: Not on file  Social Connections: Not on file  Intimate Partner Violence: Not on file     REVIEW OF SYSTEMS:   [X]  denotes positive finding, [ ]  denotes negative finding Cardiac  Comments:  Chest pain or chest pressure:    Shortness of breath upon exertion:    Short of breath when lying flat:    Irregular heart rhythm:        Vascular    Pain in calf, thigh, or hip brought on by ambulation:    Pain in feet at night that wakes you up from your sleep:     Blood clot in your veins:    Leg swelling:         Pulmonary    Oxygen at home:    Productive cough:     Wheezing:         Neurologic  Sudden weakness in arms or legs:     Sudden numbness in arms or legs:     Sudden onset of difficulty speaking or slurred speech:    Temporary loss of vision in one eye:     Problems with dizziness:         Gastrointestinal    Blood in stool:     Vomited blood:         Genitourinary    Burning when urinating:     Blood in urine:        Psychiatric    Major depression:         Hematologic    Bleeding problems:    Problems with blood clotting too easily:        Skin    Rashes or ulcers:        Constitutional    Fever or chills:      PHYSICAL EXAMINATION:  Today's Vitals   05/03/21 1111  BP: (!) 161/85  Pulse: 87  Resp: 20  Temp: 98 F (36.7 C)  TempSrc: Temporal  SpO2: 97%  Weight: 156 lb (70.8 kg)  Height: 5\' 4"  (1.626 m)  PainSc: 6    PainLoc: Leg   Body mass index is 26.78 kg/m.   General:  WDWN in NAD; vital signs documented above Gait: Not observed HENT: WNL, normocephalic Pulmonary: normal non-labored breathing , without wheezing Cardiac: regular HR, without  Murmur; without carotid bruits Abdomen: soft, NT, no masses; aortic pulse is not palpable Skin: without rashes Vascular Exam/Pulses:  Right Left  DP 2+ (normal) Faintly palpable with brisk doppler signal present  peroneal Faint doppler signal Brisk doppler signal   Extremities: wound at base of left great toe (plantar surface) has greatly improved.  Right foot with good color.  Wound on 2nd toe on left dorsum has healed.   Left foot   Left foot   Right foot   Musculoskeletal: no muscle wasting or atrophy  Neurologic: A&O X 3;  No focal weakness or paresthesias are detected Psychiatric:  The pt has Normal affect.   Non-Invasive Vascular Imaging:   ABI's/TBI's on 05/03/2021: Right:  [place]/0.45 - Great toe pressure: 78 Left:  1.47 - Great toe pressure: waveform present (bandage in place)  LLE Arterial duplex on 05/03/2021: +-----------+--------+-----+--------+----------+--------+  LEFT       PSV cm/sRatioStenosisWaveform  Comments  +-----------+--------+-----+--------+----------+--------+  CFA Distal 73                   biphasic            +-----------+--------+-----+--------+----------+--------+  DFA        59                   biphasic            +-----------+--------+-----+--------+----------+--------+  SFA Prox   69                   biphasic            +-----------+--------+-----+--------+----------+--------+  SFA Mid    54                   biphasic            +-----------+--------+-----+--------+----------+--------+  SFA Distal 43                   biphasic            +-----------+--------+-----+--------+----------+--------+  POP Prox   44  biphasic             +-----------+--------+-----+--------+----------+--------+  POP Distal 49                   biphasic            +-----------+--------+-----+--------+----------+--------+  ATA Prox   39                   biphasic            +-----------+--------+-----+--------+----------+--------+  ATA Mid    46                   monophasic          +-----------+--------+-----+--------+----------+--------+  ATA Distal 66                   biphasic            +-----------+--------+-----+--------+----------+--------+  PTA Distal 37                   biphasic            +-----------+--------+-----+--------+----------+--------+  PERO Distal58                   biphasic            +-----------+--------+-----+--------+----------+--------+   Summary:  Left: Patent left leg arterial system with no evidence of stenosis. Unable  to adequately identify the stent walls.  Previous ABI's/TBI's on 01/25/2021: Right:  [place] Left:  [place]   Previous arterial duplex on 01/25/2021: +-----------+--------+-----+--------+--------+--------+  LEFT       PSV cm/sRatioStenosisWaveformComments  +-----------+--------+-----+--------+--------+--------+  CFA Prox   95                   biphasic          +-----------+--------+-----+--------+--------+--------+  CFA Distal 90                   biphasic          +-----------+--------+-----+--------+--------+--------+  DFA        89                   biphasic          +-----------+--------+-----+--------+--------+--------+  SFA Prox   91                   biphasic          +-----------+--------+-----+--------+--------+--------+  SFA Mid    85                   biphasic          +-----------+--------+-----+--------+--------+--------+  SFA Distal 72                   biphasic          +-----------+--------+-----+--------+--------+--------+  POP Prox   69                   biphasic           +-----------+--------+-----+--------+--------+--------+  POP Distal 81                   biphasic          +-----------+--------+-----+--------+--------+--------+  ATA Distal 63                   biphasic          +-----------+--------+-----+--------+--------+--------+  PTA Distal 93  biphasic          +-----------+--------+-----+--------+--------+--------+  PERO Distal71                   biphasic          +-----------+--------+-----+--------+--------+--------+    Summary:  Left: Anterior tibial artery stent walls were not able to be clearly  defined. However, anterior tibial artery appears patent.   ASSESSMENT/PLAN:: 87 y.o. female here for follow up for PAD and s/p angiogram with left ATA stenting and angioplasty May 2022 by Dr. Stanford Breed  -pt's duplex today reveals the ATA stent is patent.  Her wound looks good and continues to heal.  Recommend dial soap and water daily, dry and paint with betadine and apply bandage daily.   -continue plavix and asa -pt will f/u in 3 months with studies and for wound check.  They know to call sooner if there are any issues.   Leontine Locket, [facility] [phone]  Clinic MD:   Stanford Breed

## 2021-05-04 ENCOUNTER — Other Ambulatory Visit: Payer: Self-pay

## 2021-05-04 ENCOUNTER — Encounter: Payer: Self-pay | Admitting: Internal Medicine

## 2021-05-04 ENCOUNTER — Ambulatory Visit (INDEPENDENT_AMBULATORY_CARE_PROVIDER_SITE_OTHER): Payer: Medicare Other | Admitting: Internal Medicine

## 2021-05-04 VITALS — BP 150/88 | HR 87 | Temp 98.1°F

## 2021-05-04 DIAGNOSIS — M86172 Other acute osteomyelitis, left ankle and foot: Secondary | ICD-10-CM

## 2021-05-04 DIAGNOSIS — M009 Pyogenic arthritis, unspecified: Secondary | ICD-10-CM

## 2021-05-04 NOTE — Assessment & Plan Note (Signed)
She has a history of right shoulder PJI due to E coli in 2020 with retained hardware.  She was on ciprofloxacin for suppression for about 2.5 years until several weeks ago at which point we were able to stop this.  She also discontinued her longstanding Fentanyl patch at the same time.  She has some worsening pain in this shoulder since which may be due to arthritis and inadequate pain control vs recurrent infection.  She had labs done last week and her WBC was normal, ESR barely above elevated at 30; both of which would likely argue against joint infection.  On exam there is some decreased ROM but no overt tenderness or warmth and there is no erythema.  She is going to follow up with orthopedics tomorrow to get their assessment and see if a joint aspiration is indicated as I explained this would be the best way to diagnose a septic joint.  Follow up as needed pending orthopedic evaluation.

## 2021-05-04 NOTE — Patient Instructions (Signed)
Thank you for coming to see me today. It was a pleasure seeing you.  To Do: I will talk with Dr Veverly Fells' office about a follow up visit to see about joint aspiration or further evaluation of shoulder pain Follow up as needed for now.  Will plan a follow up if needed after visit with Dr Veverly Fells Hold off on further antibiotics for the time being.  If you have any questions or concerns, please do not hesitate to call the office at [phone].  Take Care,   Jule Ser, DO

## 2021-05-04 NOTE — Assessment & Plan Note (Signed)
>>  ASSESSMENT AND PLAN FOR INFECTION OF SHOULDER ([place]) WRITTEN ON 05/04/2021  3:06 PM BY WALLACE, ANDREW N, DO  She has a history of right shoulder PJI due to E coli in 2020 with retained hardware.  She was on ciprofloxacin for suppression for about 2.5 years until several weeks ago at which point we were able to stop this.  She also discontinued her longstanding Fentanyl patch at the same time.  She has some worsening pain in this shoulder since which may be due to arthritis and inadequate pain control vs recurrent infection.  She had labs done last week and her WBC was normal, ESR barely above elevated at 30; both of which would likely argue against joint infection.  On exam there is some decreased ROM but no overt tenderness or warmth and there is no erythema.  She is going to follow up with orthopedics tomorrow to get their assessment and see if a joint aspiration is indicated as I explained this would be the best way to diagnose a septic joint.  Follow up as needed pending orthopedic evaluation.

## 2021-05-04 NOTE — Assessment & Plan Note (Addendum)
She completed antibiotics for this and has healed well.  No current signs of ongoing infection in her left great toe.  Continue wound care and follow up as needed.

## 2021-05-04 NOTE — Progress Notes (Signed)
[place] [facility] Infectious Disease  CHIEF COMPLAINT:    Follow up for osteomyelitis  SUBJECTIVE:    Heidi Dominguez is a 87 y.o. female with PMHx as below who presents to the clinic for right great toe osteomyelitis.   Heidi Dominguez last presented on 03/25/21 at which time her antibiotics were continued for 2 more weeks to ultimately finish ~12 weeks of therapy for MRSA OM of the toe without formal debridement with a combination of IV and PO antibiotics.  She has now been off antibiotics for approximately 4 weeks.  Her riight toe is healing well and there are no significant concerns with this today.  She saw vascular surgery yesterday.   Of note, she was taken off cipro during recent hospitalization after having been on this for about 2.5 years for a right shoulder PJI with retained hardware that was being managed by her surgeon, Dr Veverly Fells.  I discussed with Dr Veverly Fells' PA Leroy Sea) after Montgomery's last visit to inform them that her cipro had been discontinued.  It was noted by her daughter that she felt her right shoulder has become painful and warm in this interval since being off antibiotics.  Incidentally, she also discontinued her Fentanyl patch that has provided good pain control during this time as well.  There has been no erythema with her shoulder.  They are concerned regarding recurrent infection.  She had labs done from her facility on  9/15 with normal WBC, Hgb, Platelets.  ESR 30, Creat 0.7, LFTs normal.    Please see A&P for the details of today's visit and status of the patient's medical problems.   Patient's Medications  New Prescriptions   No medications on file  Previous Medications   ACETAMINOPHEN (TYLENOL) 650 MG CR TABLET    Take 1,300 mg by mouth every 8 (eight) hours as needed for pain.   ALBUTEROL (VENTOLIN HFA) 108 (90 BASE) MCG/ACT INHALER    Inhale 2 puffs into the lungs every 6 (six) hours as needed for wheezing or shortness of breath.   AMLODIPINE (NORVASC) 10 MG  TABLET    Take 10 mg by mouth daily.   ASPIRIN EC 81 MG TABLET    Take 81 mg by mouth at bedtime.   CARBOXYMETHYLCELLULOSE SOD PF (REFRESH PLUS) 0.5 % SOLN    Place 1 drop into both eyes daily as needed (eye injections).   CETIRIZINE (ZYRTEC) 10 MG TABLET    Take 10 mg by mouth at bedtime.   CHOLECALCIFEROL (VITAMIN D-3) 125 MCG (5000 UT) TABS    Take 5,000 Units by mouth every Monday.   CLOPIDOGREL (PLAVIX) 75 MG TABLET    Take 1 tablet (75 mg total) by mouth daily.   FAMOTIDINE (PEPCID) 20 MG TABLET    Take one after  supper   FERROUS SULFATE 325 (65 FE) MG TABLET    Take 325 mg by mouth at bedtime.   FLUCONAZOLE (DIFLUCAN) 150 MG TABLET    Take 1 tablet by mouth once. May repeat in 3 days if symptoms persist.   FUROSEMIDE (LASIX) 20 MG TABLET    Take 20 mg by mouth daily.   JANUVIA 50 MG TABLET    Take 50 mg by mouth daily.    LEVALBUTEROL (XOPENEX) 1.25 MG/3ML NEBULIZER SOLUTION    Take 1.25 mg by nebulization every 4 (four) hours as needed for shortness of breath or wheezing.   LIDOCAINE (XYLOCAINE) 5 % OINTMENT    APPLY TOPICALLY AS  NEEDED. TOES FOR PAIN   LORATADINE (CLARITIN) 10 MG TABLET    1 tablet   LOSARTAN (COZAAR) 25 MG TABLET    Take 25 mg by mouth daily.   MULTIPLE VITAMIN (MULTIVITAMIN WITH MINERALS) TABS TABLET    Take 1 tablet by mouth daily. One a Day for 65 +   MULTIPLE VITAMINS-MINERALS (PRESERVISION AREDS 2) CAPS    Take 1 capsule by mouth every morning.   ONDANSETRON (ZOFRAN) 4 MG TABLET    Take 4 mg by mouth every 8 (eight) hours as needed for nausea or vomiting.   ONETOUCH VERIO TEST STRIP    USE TO CHECK BLOOD SUGAR ONCE DAILY.   PANTOPRAZOLE (PROTONIX) 40 MG TABLET    Take 1 tablet (40 mg total) by mouth daily. Take 30-60 min before first meal of the day   POTASSIUM CHLORIDE (K-DUR) 10 MEQ TABLET    Take 10 mEq by mouth daily.   SYSTANE COMPLETE 0.6 % SOLN    Place 2 drops into both eyes 2 (two) times daily.   VITAMIN B-12 (CYANOCOBALAMIN) 1000 MCG TABLET    Take  1,000 mcg by mouth every Sunday.  Modified Medications   No medications on file  Discontinued Medications   No medications on file      Past Medical History:  Diagnosis Date   Acute pancreatitis    hx of    Arthritis    "qwhere" (07/04/2018)   Chronic back pain    "all over" (07/04/2018)   Chronic diastolic CHF (congestive heart failure) (HCC)    Chronic kidney disease    stage  3 chronic kidney disease    Complication of anesthesia    "I have a hard time waking up"   Degenerative joint disease of shoulder region    Dry eye syndrome    Dyspnea    Family history of adverse reaction to anesthesia    "daughter has the shakes when she wakes up"   GERD (gastroesophageal reflux disease)    Heart murmur    High cholesterol    History of hiatal hernia    HOH (hard of hearing)    Hypertension    Impacted cerumen of both ears    hx of    Muscle weakness (generalized)    Osteoarthritis    Overactive bladder    Pancreatitis    hx of    Phlebitis    "BLE"   Rotator cuff tear    right    Tear of right supraspinatus tendon    Thrombocytopenia (HCC)    hx of    Type II diabetes mellitus (HCC)    Urinary tract infection    hx of    Xerosis cutis    hx of     Social History   Tobacco Use   Smoking status: Never   Smokeless tobacco: Never  Vaping Use   Vaping Use: Never used  Substance Use Topics   Alcohol use: No    Comment: rare   Drug use: No    Family History  Problem Relation Age of Onset   Hypertension Mother    Diabetes Mellitus I Mother    Hypertension Father     Allergies  Allergen Reactions   Amoxicillin-Pot Clavulanate Diarrhea   Catapres [Clonidine Hcl] Other (See Comments)    Unknown.    Codeine Other (See Comments)    "Makes me out of my head, loopy"   Hydrocodone Other (See Comments)    "makes me go  out of my head, loopy"   Lisinopril Cough   Other Other (See Comments)    Any type of narcotics Feels "loopy"    Sulfa Antibiotics  Itching and Other (See Comments)   Sulfamethoxazole Diarrhea   Verapamil Diarrhea and Other (See Comments)   Sulfasalazine Itching    Review of Systems  Constitutional: Negative.   Gastrointestinal: Negative.   Musculoskeletal:  Positive for joint pain.    OBJECTIVE:    Vitals:   05/04/21 1412  BP: (!) 150/88  Pulse: 87  Temp: 98.1 F (36.7 C)  TempSrc: Oral  SpO2: 94%   There is no height or weight on file to calculate BMI.  Physical Exam Constitutional:      General: She is not in acute distress.    Appearance: Normal appearance.  Pulmonary:     Effort: Pulmonary effort is normal. No respiratory distress.  Musculoskeletal:        General: No tenderness.     Comments: Right shoulder not overtly hot to the touch.  No erythema.  She has decreased ROM which is likely c/w arthritis and prosthetic joint.  Surgical incision is well healed.   See pictures of right great toe ulcer.    Skin:    General: Skin is warm and dry.     Findings: No erythema.  Neurological:     Mental Status: She is alert.          Labs and Microbiology: CBC Latest Ref Rng & Units 03/25/2021 03/18/2021 03/17/2021  WBC 3.8 - 10.8 Thousand/uL 11.3(H) 4.6 3.9(L)  Hemoglobin 11.7 - 15.5 g/dL 17.6(H) 14.3 13.9  Hematocrit 35.0 - 45.0 % 53.5(H) 42.9 42.6  Platelets 140 - 400 Thousand/uL 246 158 149(L)   CMP Latest Ref Rng & Units 03/23/2021 03/22/2021 03/21/2021  Glucose 70 - 99 mg/dL 122(H) 120(H) 125(H)  BUN 8 - 23 mg/dL _0 Creatinine 0.44 - 1.00 mg/dL 0.74 0.69 0.71  Sodium 135 - 145 mmol/L 134(L) 131(L) 132(L)  Potassium 3.5 - 5.1 mmol/L 3.7 3.6 3.9  Chloride 98 - 111 mmol/L 98 96(L) 96(L)  CO2 22 - 32 mmol/L _1 Calcium 8.9 - 10.3 mg/dL 9.2 9.0 9.1  Total Protein 6.5 - 8.1 g/dL - - -  Total Bilirubin 0.3 - 1.2 mg/dL - - -  Alkaline Phos 38 - 126 U/L - - -  AST 15 - 41 U/L - - -  ALT 0 - 44 U/L - - -      ASSESSMENT & PLAN:    Osteomyelitis ([place]) She completed antibiotics  for this and has healed well.  No current signs of ongoing infection in her left great toe.  Continue wound care and follow up as needed.   Infection of shoulder ([place]) She has a history of right shoulder PJI due to E coli in 2020 with retained hardware.  She was on ciprofloxacin for suppression for about 2.5 years until several weeks ago at which point we were able to stop this.  She also discontinued her longstanding Fentanyl patch at the same time.  She has some worsening pain in this shoulder since which may be due to arthritis and inadequate pain control vs recurrent infection.  She had labs done last week and her WBC was normal, ESR barely above elevated at 30; both of which would likely argue against joint infection.  On exam there is some decreased ROM but no overt tenderness or warmth and there is no erythema.  She is going to follow up with orthopedics tomorrow to get their assessment and see if a joint aspiration is indicated as I explained this would be the best way to diagnose a septic joint.  Follow up as needed pending orthopedic evaluation.    Raynelle Highland for Infectious Disease [facility] 05/04/2021, 3:06 PM  I spent 40 minutes dedicated to the care of this patient on the date of this encounter to include pre-visit review of records, face-to-face time with the patient discussing OM, PJI of shoulder, and post-visit ordering of testing.

## 2021-05-05 ENCOUNTER — Other Ambulatory Visit: Payer: Self-pay

## 2021-05-05 DIAGNOSIS — M25511 Pain in right shoulder: Secondary | ICD-10-CM | POA: Diagnosis not present

## 2021-05-05 DIAGNOSIS — I739 Peripheral vascular disease, unspecified: Secondary | ICD-10-CM

## 2021-05-06 ENCOUNTER — Other Ambulatory Visit: Payer: Self-pay | Admitting: Orthopedic Surgery

## 2021-05-06 ENCOUNTER — Ambulatory Visit: Payer: Medicare Other | Admitting: Internal Medicine

## 2021-05-06 DIAGNOSIS — T8450XD Infection and inflammatory reaction due to unspecified internal joint prosthesis, subsequent encounter: Secondary | ICD-10-CM | POA: Diagnosis not present

## 2021-05-06 DIAGNOSIS — R5381 Other malaise: Secondary | ICD-10-CM | POA: Diagnosis not present

## 2021-05-06 DIAGNOSIS — M1990 Unspecified osteoarthritis, unspecified site: Secondary | ICD-10-CM | POA: Diagnosis not present

## 2021-05-06 DIAGNOSIS — M25511 Pain in right shoulder: Secondary | ICD-10-CM

## 2021-05-09 ENCOUNTER — Other Ambulatory Visit: Payer: Self-pay | Admitting: *Deleted

## 2021-05-09 NOTE — Patient Outreach (Signed)
Member screened for potential [facility] Care Management needs. Mrs. Giambra resides in [facility].  Update received from [place] indicating member likely to return home this week.   Telephone call made to [employer] (daughter/DPR) [phone]. Patient identifiers confirmed. Explained [place] Management services. [name] is interested in handicap accessible transportation. Discussed that Probation officer can make referral for [facility] care guide for arrangements. [name] goes on to say she is not sure if member will return home or remain long term at the facility. [name] states member has developed some "issues" and more tests are needed.   [name] asks that Probation officer call her back Oct.7th. Discussed that Probation officer will email [facility] website information along with writer's and [place] Management office telephone numbers as well.  Will continue to follow for transition plans. Will plan outreach to daughter at later date per her request.    Marthenia Rolling, MSN, RN,BSN [place] Coordinator [phone] [facility]) [phone]  (Toll free office)

## 2021-05-12 ENCOUNTER — Other Ambulatory Visit (HOSPITAL_COMMUNITY): Payer: Self-pay | Admitting: Orthopedic Surgery

## 2021-05-12 DIAGNOSIS — M25511 Pain in right shoulder: Secondary | ICD-10-CM

## 2021-05-13 ENCOUNTER — Other Ambulatory Visit (HOSPITAL_COMMUNITY): Payer: Self-pay | Admitting: Orthopedic Surgery

## 2021-05-13 DIAGNOSIS — N183 Chronic kidney disease, stage 3 unspecified: Secondary | ICD-10-CM | POA: Diagnosis not present

## 2021-05-13 DIAGNOSIS — E118 Type 2 diabetes mellitus with unspecified complications: Secondary | ICD-10-CM | POA: Diagnosis not present

## 2021-05-13 DIAGNOSIS — M19019 Primary osteoarthritis, unspecified shoulder: Secondary | ICD-10-CM | POA: Diagnosis not present

## 2021-05-13 DIAGNOSIS — I739 Peripheral vascular disease, unspecified: Secondary | ICD-10-CM | POA: Diagnosis not present

## 2021-05-13 DIAGNOSIS — M25511 Pain in right shoulder: Secondary | ICD-10-CM

## 2021-05-17 ENCOUNTER — Ambulatory Visit (HOSPITAL_COMMUNITY): Payer: Medicare Other

## 2021-05-18 ENCOUNTER — Encounter (HOSPITAL_COMMUNITY): Payer: Self-pay

## 2021-05-18 ENCOUNTER — Ambulatory Visit (HOSPITAL_COMMUNITY): Payer: Medicare Other

## 2021-05-18 DIAGNOSIS — R2681 Unsteadiness on feet: Secondary | ICD-10-CM | POA: Diagnosis not present

## 2021-05-18 DIAGNOSIS — R1312 Dysphagia, oropharyngeal phase: Secondary | ICD-10-CM | POA: Diagnosis not present

## 2021-05-18 DIAGNOSIS — M6259 Muscle wasting and atrophy, not elsewhere classified, multiple sites: Secondary | ICD-10-CM | POA: Diagnosis not present

## 2021-05-18 DIAGNOSIS — J9601 Acute respiratory failure with hypoxia: Secondary | ICD-10-CM | POA: Diagnosis not present

## 2021-05-18 DIAGNOSIS — R2689 Other abnormalities of gait and mobility: Secondary | ICD-10-CM | POA: Diagnosis not present

## 2021-05-18 DIAGNOSIS — M6281 Muscle weakness (generalized): Secondary | ICD-10-CM | POA: Diagnosis not present

## 2021-05-18 DIAGNOSIS — U071 COVID-19: Secondary | ICD-10-CM | POA: Diagnosis not present

## 2021-05-18 DIAGNOSIS — K219 Gastro-esophageal reflux disease without esophagitis: Secondary | ICD-10-CM | POA: Diagnosis not present

## 2021-05-18 DIAGNOSIS — E1121 Type 2 diabetes mellitus with diabetic nephropathy: Secondary | ICD-10-CM | POA: Diagnosis not present

## 2021-05-18 DIAGNOSIS — R41841 Cognitive communication deficit: Secondary | ICD-10-CM | POA: Diagnosis not present

## 2021-05-19 DIAGNOSIS — R1312 Dysphagia, oropharyngeal phase: Secondary | ICD-10-CM | POA: Diagnosis not present

## 2021-05-19 DIAGNOSIS — M6281 Muscle weakness (generalized): Secondary | ICD-10-CM | POA: Diagnosis not present

## 2021-05-19 DIAGNOSIS — M6259 Muscle wasting and atrophy, not elsewhere classified, multiple sites: Secondary | ICD-10-CM | POA: Diagnosis not present

## 2021-05-19 DIAGNOSIS — R2689 Other abnormalities of gait and mobility: Secondary | ICD-10-CM | POA: Diagnosis not present

## 2021-05-19 DIAGNOSIS — R41841 Cognitive communication deficit: Secondary | ICD-10-CM | POA: Diagnosis not present

## 2021-05-19 DIAGNOSIS — R2681 Unsteadiness on feet: Secondary | ICD-10-CM | POA: Diagnosis not present

## 2021-05-20 ENCOUNTER — Other Ambulatory Visit: Payer: Self-pay | Admitting: *Deleted

## 2021-05-20 DIAGNOSIS — M6259 Muscle wasting and atrophy, not elsewhere classified, multiple sites: Secondary | ICD-10-CM | POA: Diagnosis not present

## 2021-05-20 DIAGNOSIS — R41841 Cognitive communication deficit: Secondary | ICD-10-CM | POA: Diagnosis not present

## 2021-05-20 DIAGNOSIS — R2681 Unsteadiness on feet: Secondary | ICD-10-CM | POA: Diagnosis not present

## 2021-05-20 DIAGNOSIS — M6281 Muscle weakness (generalized): Secondary | ICD-10-CM | POA: Diagnosis not present

## 2021-05-20 DIAGNOSIS — R1312 Dysphagia, oropharyngeal phase: Secondary | ICD-10-CM | POA: Diagnosis not present

## 2021-05-20 DIAGNOSIS — R2689 Other abnormalities of gait and mobility: Secondary | ICD-10-CM | POA: Diagnosis not present

## 2021-05-24 DIAGNOSIS — R1312 Dysphagia, oropharyngeal phase: Secondary | ICD-10-CM | POA: Diagnosis not present

## 2021-05-24 DIAGNOSIS — M6281 Muscle weakness (generalized): Secondary | ICD-10-CM | POA: Diagnosis not present

## 2021-05-24 DIAGNOSIS — R2689 Other abnormalities of gait and mobility: Secondary | ICD-10-CM | POA: Diagnosis not present

## 2021-05-24 DIAGNOSIS — R2681 Unsteadiness on feet: Secondary | ICD-10-CM | POA: Diagnosis not present

## 2021-05-24 DIAGNOSIS — R41841 Cognitive communication deficit: Secondary | ICD-10-CM | POA: Diagnosis not present

## 2021-05-24 DIAGNOSIS — M6259 Muscle wasting and atrophy, not elsewhere classified, multiple sites: Secondary | ICD-10-CM | POA: Diagnosis not present

## 2021-05-25 DIAGNOSIS — M6281 Muscle weakness (generalized): Secondary | ICD-10-CM | POA: Diagnosis not present

## 2021-05-25 DIAGNOSIS — R2689 Other abnormalities of gait and mobility: Secondary | ICD-10-CM | POA: Diagnosis not present

## 2021-05-25 DIAGNOSIS — R1312 Dysphagia, oropharyngeal phase: Secondary | ICD-10-CM | POA: Diagnosis not present

## 2021-05-25 DIAGNOSIS — R2681 Unsteadiness on feet: Secondary | ICD-10-CM | POA: Diagnosis not present

## 2021-05-25 DIAGNOSIS — R41841 Cognitive communication deficit: Secondary | ICD-10-CM | POA: Diagnosis not present

## 2021-05-25 DIAGNOSIS — M6259 Muscle wasting and atrophy, not elsewhere classified, multiple sites: Secondary | ICD-10-CM | POA: Diagnosis not present

## 2021-05-27 ENCOUNTER — Ambulatory Visit (HOSPITAL_COMMUNITY)
Admission: RE | Admit: 2021-05-27 | Discharge: 2021-05-27 | Disposition: A | Discharge status: Home / Self Care | Payer: Medicare Other | Source: Ambulatory Visit | Attending: Orthopedic Surgery | Admitting: Orthopedic Surgery

## 2021-05-27 ENCOUNTER — Other Ambulatory Visit: Payer: Self-pay

## 2021-05-27 DIAGNOSIS — M25511 Pain in right shoulder: Secondary | ICD-10-CM | POA: Diagnosis not present

## 2021-05-27 DIAGNOSIS — M25411 Effusion, right shoulder: Secondary | ICD-10-CM | POA: Diagnosis not present

## 2021-05-27 DIAGNOSIS — Z96611 Presence of right artificial shoulder joint: Secondary | ICD-10-CM | POA: Diagnosis not present

## 2021-06-01 ENCOUNTER — Telehealth: Payer: Self-pay

## 2021-06-01 DIAGNOSIS — I10 Essential (primary) hypertension: Secondary | ICD-10-CM | POA: Diagnosis not present

## 2021-06-01 DIAGNOSIS — M19019 Primary osteoarthritis, unspecified shoulder: Secondary | ICD-10-CM | POA: Diagnosis not present

## 2021-06-01 DIAGNOSIS — Z8739 Personal history of other diseases of the musculoskeletal system and connective tissue: Secondary | ICD-10-CM | POA: Diagnosis not present

## 2021-06-01 DIAGNOSIS — M40209 Unspecified kyphosis, site unspecified: Secondary | ICD-10-CM | POA: Diagnosis not present

## 2021-06-01 DIAGNOSIS — R29898 Other symptoms and signs involving the musculoskeletal system: Secondary | ICD-10-CM | POA: Diagnosis not present

## 2021-06-01 NOTE — Telephone Encounter (Signed)
Patients daughter called to cancel, will call back to reschedule they were having transportation issues.. Dr. Juleen China requested future appointments to be scheduled for 30 minutes.

## 2021-06-03 ENCOUNTER — Ambulatory Visit: Payer: Medicare Other | Admitting: Internal Medicine

## 2021-06-09 ENCOUNTER — Ambulatory Visit: Payer: Medicare Other | Admitting: Sports Medicine

## 2021-06-20 DIAGNOSIS — R059 Cough, unspecified: Secondary | ICD-10-CM | POA: Diagnosis not present

## 2021-06-21 DIAGNOSIS — R5383 Other fatigue: Secondary | ICD-10-CM | POA: Diagnosis not present

## 2021-06-21 DIAGNOSIS — R059 Cough, unspecified: Secondary | ICD-10-CM | POA: Diagnosis not present

## 2021-06-21 DIAGNOSIS — R5381 Other malaise: Secondary | ICD-10-CM | POA: Diagnosis not present

## 2021-06-21 DIAGNOSIS — I10 Essential (primary) hypertension: Secondary | ICD-10-CM | POA: Diagnosis not present

## 2021-06-21 DIAGNOSIS — J189 Pneumonia, unspecified organism: Secondary | ICD-10-CM | POA: Diagnosis not present

## 2021-06-22 DIAGNOSIS — J189 Pneumonia, unspecified organism: Secondary | ICD-10-CM | POA: Diagnosis not present

## 2021-06-22 DIAGNOSIS — R059 Cough, unspecified: Secondary | ICD-10-CM | POA: Diagnosis not present

## 2021-06-22 DIAGNOSIS — R5383 Other fatigue: Secondary | ICD-10-CM | POA: Diagnosis not present

## 2021-06-23 ENCOUNTER — Telehealth: Payer: Self-pay

## 2021-06-23 NOTE — Telephone Encounter (Signed)
Spoke with the patient's daughter who is very concerned about her mother's deterioration. Relayed that Dr. Juleen China feels this may mean she is failing outpatient therapy and needs inpatient evaluation at the hospital. Patient's daughter states she will reach out to nursing facility to share recommendation that patient be taken to the hospital.   [name], RN

## 2021-06-23 NOTE — Telephone Encounter (Signed)
Patient's daughter called, says the patient has been admitted to a nursing facility. She has been coughing for about 2 weeks and they ordered a CXR 11/7 that showed pneumonia per patient's daughter.   The facility started her on doxycycline 100mg , which the patient's daughter felt like was too low of a dose. She says the patient looks worse and now has bloodshot eyes and "green pus" draining from her eye.   The NP at the facility mentioned giving her a shot of [place], but the patient's daughter does not think this was ever done. She says the NP told her that it's possible the infection is now "in her sinuses and stringing out of her eye."   Patient's daughter is concerned that there is "infection in her body," and wants Dr. Juleen China to be aware of the situation. She wants to know if the patient needs to come in for an appointment. Will route to provider.   [name], RN

## 2021-06-24 DIAGNOSIS — R5381 Other malaise: Secondary | ICD-10-CM | POA: Diagnosis not present

## 2021-06-24 DIAGNOSIS — J189 Pneumonia, unspecified organism: Secondary | ICD-10-CM | POA: Diagnosis not present

## 2021-06-24 DIAGNOSIS — K5901 Slow transit constipation: Secondary | ICD-10-CM | POA: Diagnosis not present

## 2021-06-24 DIAGNOSIS — R5383 Other fatigue: Secondary | ICD-10-CM | POA: Diagnosis not present

## 2021-06-24 DIAGNOSIS — I152 Hypertension secondary to endocrine disorders: Secondary | ICD-10-CM | POA: Diagnosis not present

## 2021-06-27 DIAGNOSIS — J9601 Acute respiratory failure with hypoxia: Secondary | ICD-10-CM | POA: Diagnosis not present

## 2021-06-27 DIAGNOSIS — I5033 Acute on chronic diastolic (congestive) heart failure: Secondary | ICD-10-CM | POA: Diagnosis not present

## 2021-06-27 DIAGNOSIS — R2689 Other abnormalities of gait and mobility: Secondary | ICD-10-CM | POA: Diagnosis not present

## 2021-06-27 DIAGNOSIS — U071 COVID-19: Secondary | ICD-10-CM | POA: Diagnosis not present

## 2021-06-27 DIAGNOSIS — K219 Gastro-esophageal reflux disease without esophagitis: Secondary | ICD-10-CM | POA: Diagnosis not present

## 2021-06-27 DIAGNOSIS — M6281 Muscle weakness (generalized): Secondary | ICD-10-CM | POA: Diagnosis not present

## 2021-06-27 DIAGNOSIS — M6259 Muscle wasting and atrophy, not elsewhere classified, multiple sites: Secondary | ICD-10-CM | POA: Diagnosis not present

## 2021-06-27 DIAGNOSIS — R1312 Dysphagia, oropharyngeal phase: Secondary | ICD-10-CM | POA: Diagnosis not present

## 2021-06-27 DIAGNOSIS — R278 Other lack of coordination: Secondary | ICD-10-CM | POA: Diagnosis not present

## 2021-06-27 DIAGNOSIS — R2681 Unsteadiness on feet: Secondary | ICD-10-CM | POA: Diagnosis not present

## 2021-06-27 DIAGNOSIS — E1121 Type 2 diabetes mellitus with diabetic nephropathy: Secondary | ICD-10-CM | POA: Diagnosis not present

## 2021-06-27 DIAGNOSIS — R262 Difficulty in walking, not elsewhere classified: Secondary | ICD-10-CM | POA: Diagnosis not present

## 2021-06-27 DIAGNOSIS — R41841 Cognitive communication deficit: Secondary | ICD-10-CM | POA: Diagnosis not present

## 2021-06-28 DIAGNOSIS — I5033 Acute on chronic diastolic (congestive) heart failure: Secondary | ICD-10-CM | POA: Diagnosis not present

## 2021-06-28 DIAGNOSIS — J9601 Acute respiratory failure with hypoxia: Secondary | ICD-10-CM | POA: Diagnosis not present

## 2021-06-28 DIAGNOSIS — M6281 Muscle weakness (generalized): Secondary | ICD-10-CM | POA: Diagnosis not present

## 2021-06-28 DIAGNOSIS — R2689 Other abnormalities of gait and mobility: Secondary | ICD-10-CM | POA: Diagnosis not present

## 2021-06-28 DIAGNOSIS — R262 Difficulty in walking, not elsewhere classified: Secondary | ICD-10-CM | POA: Diagnosis not present

## 2021-06-28 DIAGNOSIS — M6259 Muscle wasting and atrophy, not elsewhere classified, multiple sites: Secondary | ICD-10-CM | POA: Diagnosis not present

## 2021-06-29 DIAGNOSIS — I5033 Acute on chronic diastolic (congestive) heart failure: Secondary | ICD-10-CM | POA: Diagnosis not present

## 2021-06-29 DIAGNOSIS — R262 Difficulty in walking, not elsewhere classified: Secondary | ICD-10-CM | POA: Diagnosis not present

## 2021-06-29 DIAGNOSIS — J9601 Acute respiratory failure with hypoxia: Secondary | ICD-10-CM | POA: Diagnosis not present

## 2021-06-29 DIAGNOSIS — M6281 Muscle weakness (generalized): Secondary | ICD-10-CM | POA: Diagnosis not present

## 2021-06-29 DIAGNOSIS — R2689 Other abnormalities of gait and mobility: Secondary | ICD-10-CM | POA: Diagnosis not present

## 2021-06-29 DIAGNOSIS — M6259 Muscle wasting and atrophy, not elsewhere classified, multiple sites: Secondary | ICD-10-CM | POA: Diagnosis not present

## 2021-06-30 DIAGNOSIS — J9601 Acute respiratory failure with hypoxia: Secondary | ICD-10-CM | POA: Diagnosis not present

## 2021-06-30 DIAGNOSIS — R2689 Other abnormalities of gait and mobility: Secondary | ICD-10-CM | POA: Diagnosis not present

## 2021-06-30 DIAGNOSIS — R262 Difficulty in walking, not elsewhere classified: Secondary | ICD-10-CM | POA: Diagnosis not present

## 2021-06-30 DIAGNOSIS — M6259 Muscle wasting and atrophy, not elsewhere classified, multiple sites: Secondary | ICD-10-CM | POA: Diagnosis not present

## 2021-06-30 DIAGNOSIS — I5033 Acute on chronic diastolic (congestive) heart failure: Secondary | ICD-10-CM | POA: Diagnosis not present

## 2021-06-30 DIAGNOSIS — M6281 Muscle weakness (generalized): Secondary | ICD-10-CM | POA: Diagnosis not present

## 2021-07-01 DIAGNOSIS — R262 Difficulty in walking, not elsewhere classified: Secondary | ICD-10-CM | POA: Diagnosis not present

## 2021-07-01 DIAGNOSIS — I5033 Acute on chronic diastolic (congestive) heart failure: Secondary | ICD-10-CM | POA: Diagnosis not present

## 2021-07-01 DIAGNOSIS — M6259 Muscle wasting and atrophy, not elsewhere classified, multiple sites: Secondary | ICD-10-CM | POA: Diagnosis not present

## 2021-07-01 DIAGNOSIS — J9601 Acute respiratory failure with hypoxia: Secondary | ICD-10-CM | POA: Diagnosis not present

## 2021-07-01 DIAGNOSIS — M6281 Muscle weakness (generalized): Secondary | ICD-10-CM | POA: Diagnosis not present

## 2021-07-01 DIAGNOSIS — R2689 Other abnormalities of gait and mobility: Secondary | ICD-10-CM | POA: Diagnosis not present

## 2021-07-02 DIAGNOSIS — R2689 Other abnormalities of gait and mobility: Secondary | ICD-10-CM | POA: Diagnosis not present

## 2021-07-02 DIAGNOSIS — I5033 Acute on chronic diastolic (congestive) heart failure: Secondary | ICD-10-CM | POA: Diagnosis not present

## 2021-07-02 DIAGNOSIS — R262 Difficulty in walking, not elsewhere classified: Secondary | ICD-10-CM | POA: Diagnosis not present

## 2021-07-02 DIAGNOSIS — M6259 Muscle wasting and atrophy, not elsewhere classified, multiple sites: Secondary | ICD-10-CM | POA: Diagnosis not present

## 2021-07-02 DIAGNOSIS — M6281 Muscle weakness (generalized): Secondary | ICD-10-CM | POA: Diagnosis not present

## 2021-07-02 DIAGNOSIS — J9601 Acute respiratory failure with hypoxia: Secondary | ICD-10-CM | POA: Diagnosis not present

## 2021-07-04 DIAGNOSIS — M6281 Muscle weakness (generalized): Secondary | ICD-10-CM | POA: Diagnosis not present

## 2021-07-04 DIAGNOSIS — J9601 Acute respiratory failure with hypoxia: Secondary | ICD-10-CM | POA: Diagnosis not present

## 2021-07-04 DIAGNOSIS — I5033 Acute on chronic diastolic (congestive) heart failure: Secondary | ICD-10-CM | POA: Diagnosis not present

## 2021-07-04 DIAGNOSIS — M6259 Muscle wasting and atrophy, not elsewhere classified, multiple sites: Secondary | ICD-10-CM | POA: Diagnosis not present

## 2021-07-04 DIAGNOSIS — R2689 Other abnormalities of gait and mobility: Secondary | ICD-10-CM | POA: Diagnosis not present

## 2021-07-04 DIAGNOSIS — R262 Difficulty in walking, not elsewhere classified: Secondary | ICD-10-CM | POA: Diagnosis not present

## 2021-07-05 DIAGNOSIS — I5033 Acute on chronic diastolic (congestive) heart failure: Secondary | ICD-10-CM | POA: Diagnosis not present

## 2021-07-05 DIAGNOSIS — J9601 Acute respiratory failure with hypoxia: Secondary | ICD-10-CM | POA: Diagnosis not present

## 2021-07-05 DIAGNOSIS — M6281 Muscle weakness (generalized): Secondary | ICD-10-CM | POA: Diagnosis not present

## 2021-07-05 DIAGNOSIS — M6259 Muscle wasting and atrophy, not elsewhere classified, multiple sites: Secondary | ICD-10-CM | POA: Diagnosis not present

## 2021-07-05 DIAGNOSIS — R2689 Other abnormalities of gait and mobility: Secondary | ICD-10-CM | POA: Diagnosis not present

## 2021-07-05 DIAGNOSIS — R262 Difficulty in walking, not elsewhere classified: Secondary | ICD-10-CM | POA: Diagnosis not present

## 2021-07-06 DIAGNOSIS — M6259 Muscle wasting and atrophy, not elsewhere classified, multiple sites: Secondary | ICD-10-CM | POA: Diagnosis not present

## 2021-07-06 DIAGNOSIS — M6281 Muscle weakness (generalized): Secondary | ICD-10-CM | POA: Diagnosis not present

## 2021-07-06 DIAGNOSIS — I5033 Acute on chronic diastolic (congestive) heart failure: Secondary | ICD-10-CM | POA: Diagnosis not present

## 2021-07-06 DIAGNOSIS — J9601 Acute respiratory failure with hypoxia: Secondary | ICD-10-CM | POA: Diagnosis not present

## 2021-07-06 DIAGNOSIS — R2689 Other abnormalities of gait and mobility: Secondary | ICD-10-CM | POA: Diagnosis not present

## 2021-07-06 DIAGNOSIS — R262 Difficulty in walking, not elsewhere classified: Secondary | ICD-10-CM | POA: Diagnosis not present

## 2021-07-10 DIAGNOSIS — R2689 Other abnormalities of gait and mobility: Secondary | ICD-10-CM | POA: Diagnosis not present

## 2021-07-10 DIAGNOSIS — R262 Difficulty in walking, not elsewhere classified: Secondary | ICD-10-CM | POA: Diagnosis not present

## 2021-07-10 DIAGNOSIS — M6281 Muscle weakness (generalized): Secondary | ICD-10-CM | POA: Diagnosis not present

## 2021-07-10 DIAGNOSIS — M6259 Muscle wasting and atrophy, not elsewhere classified, multiple sites: Secondary | ICD-10-CM | POA: Diagnosis not present

## 2021-07-10 DIAGNOSIS — I5033 Acute on chronic diastolic (congestive) heart failure: Secondary | ICD-10-CM | POA: Diagnosis not present

## 2021-07-10 DIAGNOSIS — J9601 Acute respiratory failure with hypoxia: Secondary | ICD-10-CM | POA: Diagnosis not present

## 2021-07-11 DIAGNOSIS — M6281 Muscle weakness (generalized): Secondary | ICD-10-CM | POA: Diagnosis not present

## 2021-07-11 DIAGNOSIS — J9601 Acute respiratory failure with hypoxia: Secondary | ICD-10-CM | POA: Diagnosis not present

## 2021-07-11 DIAGNOSIS — R2689 Other abnormalities of gait and mobility: Secondary | ICD-10-CM | POA: Diagnosis not present

## 2021-07-11 DIAGNOSIS — I5033 Acute on chronic diastolic (congestive) heart failure: Secondary | ICD-10-CM | POA: Diagnosis not present

## 2021-07-11 DIAGNOSIS — R262 Difficulty in walking, not elsewhere classified: Secondary | ICD-10-CM | POA: Diagnosis not present

## 2021-07-11 DIAGNOSIS — M6259 Muscle wasting and atrophy, not elsewhere classified, multiple sites: Secondary | ICD-10-CM | POA: Diagnosis not present

## 2021-07-12 DIAGNOSIS — M6281 Muscle weakness (generalized): Secondary | ICD-10-CM | POA: Diagnosis not present

## 2021-07-12 DIAGNOSIS — J9601 Acute respiratory failure with hypoxia: Secondary | ICD-10-CM | POA: Diagnosis not present

## 2021-07-12 DIAGNOSIS — R2689 Other abnormalities of gait and mobility: Secondary | ICD-10-CM | POA: Diagnosis not present

## 2021-07-12 DIAGNOSIS — M6259 Muscle wasting and atrophy, not elsewhere classified, multiple sites: Secondary | ICD-10-CM | POA: Diagnosis not present

## 2021-07-12 DIAGNOSIS — I5033 Acute on chronic diastolic (congestive) heart failure: Secondary | ICD-10-CM | POA: Diagnosis not present

## 2021-07-12 DIAGNOSIS — R262 Difficulty in walking, not elsewhere classified: Secondary | ICD-10-CM | POA: Diagnosis not present

## 2021-07-13 DIAGNOSIS — J9601 Acute respiratory failure with hypoxia: Secondary | ICD-10-CM | POA: Diagnosis not present

## 2021-07-13 DIAGNOSIS — R2689 Other abnormalities of gait and mobility: Secondary | ICD-10-CM | POA: Diagnosis not present

## 2021-07-13 DIAGNOSIS — M6259 Muscle wasting and atrophy, not elsewhere classified, multiple sites: Secondary | ICD-10-CM | POA: Diagnosis not present

## 2021-07-13 DIAGNOSIS — R262 Difficulty in walking, not elsewhere classified: Secondary | ICD-10-CM | POA: Diagnosis not present

## 2021-07-13 DIAGNOSIS — M6281 Muscle weakness (generalized): Secondary | ICD-10-CM | POA: Diagnosis not present

## 2021-07-13 DIAGNOSIS — I5033 Acute on chronic diastolic (congestive) heart failure: Secondary | ICD-10-CM | POA: Diagnosis not present

## 2021-07-14 DIAGNOSIS — R41841 Cognitive communication deficit: Secondary | ICD-10-CM | POA: Diagnosis not present

## 2021-07-14 DIAGNOSIS — E1121 Type 2 diabetes mellitus with diabetic nephropathy: Secondary | ICD-10-CM | POA: Diagnosis not present

## 2021-07-14 DIAGNOSIS — I5033 Acute on chronic diastolic (congestive) heart failure: Secondary | ICD-10-CM | POA: Diagnosis not present

## 2021-07-14 DIAGNOSIS — R2681 Unsteadiness on feet: Secondary | ICD-10-CM | POA: Diagnosis not present

## 2021-07-14 DIAGNOSIS — R262 Difficulty in walking, not elsewhere classified: Secondary | ICD-10-CM | POA: Diagnosis not present

## 2021-07-14 DIAGNOSIS — M6281 Muscle weakness (generalized): Secondary | ICD-10-CM | POA: Diagnosis not present

## 2021-07-14 DIAGNOSIS — K219 Gastro-esophageal reflux disease without esophagitis: Secondary | ICD-10-CM | POA: Diagnosis not present

## 2021-07-14 DIAGNOSIS — R278 Other lack of coordination: Secondary | ICD-10-CM | POA: Diagnosis not present

## 2021-07-14 DIAGNOSIS — J9601 Acute respiratory failure with hypoxia: Secondary | ICD-10-CM | POA: Diagnosis not present

## 2021-07-14 DIAGNOSIS — U071 COVID-19: Secondary | ICD-10-CM | POA: Diagnosis not present

## 2021-07-14 DIAGNOSIS — M6259 Muscle wasting and atrophy, not elsewhere classified, multiple sites: Secondary | ICD-10-CM | POA: Diagnosis not present

## 2021-07-14 DIAGNOSIS — R2689 Other abnormalities of gait and mobility: Secondary | ICD-10-CM | POA: Diagnosis not present

## 2021-07-14 DIAGNOSIS — R1312 Dysphagia, oropharyngeal phase: Secondary | ICD-10-CM | POA: Diagnosis not present

## 2021-07-15 DIAGNOSIS — I5033 Acute on chronic diastolic (congestive) heart failure: Secondary | ICD-10-CM | POA: Diagnosis not present

## 2021-07-15 DIAGNOSIS — M6281 Muscle weakness (generalized): Secondary | ICD-10-CM | POA: Diagnosis not present

## 2021-07-15 DIAGNOSIS — R262 Difficulty in walking, not elsewhere classified: Secondary | ICD-10-CM | POA: Diagnosis not present

## 2021-07-15 DIAGNOSIS — R2689 Other abnormalities of gait and mobility: Secondary | ICD-10-CM | POA: Diagnosis not present

## 2021-07-15 DIAGNOSIS — M6259 Muscle wasting and atrophy, not elsewhere classified, multiple sites: Secondary | ICD-10-CM | POA: Diagnosis not present

## 2021-07-15 DIAGNOSIS — J9601 Acute respiratory failure with hypoxia: Secondary | ICD-10-CM | POA: Diagnosis not present

## 2021-07-16 DIAGNOSIS — J9601 Acute respiratory failure with hypoxia: Secondary | ICD-10-CM | POA: Diagnosis not present

## 2021-07-16 DIAGNOSIS — M6259 Muscle wasting and atrophy, not elsewhere classified, multiple sites: Secondary | ICD-10-CM | POA: Diagnosis not present

## 2021-07-16 DIAGNOSIS — R2689 Other abnormalities of gait and mobility: Secondary | ICD-10-CM | POA: Diagnosis not present

## 2021-07-16 DIAGNOSIS — I5033 Acute on chronic diastolic (congestive) heart failure: Secondary | ICD-10-CM | POA: Diagnosis not present

## 2021-07-16 DIAGNOSIS — M6281 Muscle weakness (generalized): Secondary | ICD-10-CM | POA: Diagnosis not present

## 2021-07-16 DIAGNOSIS — R262 Difficulty in walking, not elsewhere classified: Secondary | ICD-10-CM | POA: Diagnosis not present

## 2021-07-17 DIAGNOSIS — M6281 Muscle weakness (generalized): Secondary | ICD-10-CM | POA: Diagnosis not present

## 2021-07-17 DIAGNOSIS — J9601 Acute respiratory failure with hypoxia: Secondary | ICD-10-CM | POA: Diagnosis not present

## 2021-07-17 DIAGNOSIS — R262 Difficulty in walking, not elsewhere classified: Secondary | ICD-10-CM | POA: Diagnosis not present

## 2021-07-17 DIAGNOSIS — M6259 Muscle wasting and atrophy, not elsewhere classified, multiple sites: Secondary | ICD-10-CM | POA: Diagnosis not present

## 2021-07-17 DIAGNOSIS — I5033 Acute on chronic diastolic (congestive) heart failure: Secondary | ICD-10-CM | POA: Diagnosis not present

## 2021-07-17 DIAGNOSIS — R2689 Other abnormalities of gait and mobility: Secondary | ICD-10-CM | POA: Diagnosis not present

## 2021-07-18 DIAGNOSIS — M6281 Muscle weakness (generalized): Secondary | ICD-10-CM | POA: Diagnosis not present

## 2021-07-18 DIAGNOSIS — R262 Difficulty in walking, not elsewhere classified: Secondary | ICD-10-CM | POA: Diagnosis not present

## 2021-07-18 DIAGNOSIS — I5033 Acute on chronic diastolic (congestive) heart failure: Secondary | ICD-10-CM | POA: Diagnosis not present

## 2021-07-18 DIAGNOSIS — J9601 Acute respiratory failure with hypoxia: Secondary | ICD-10-CM | POA: Diagnosis not present

## 2021-07-18 DIAGNOSIS — M6259 Muscle wasting and atrophy, not elsewhere classified, multiple sites: Secondary | ICD-10-CM | POA: Diagnosis not present

## 2021-07-18 DIAGNOSIS — R2689 Other abnormalities of gait and mobility: Secondary | ICD-10-CM | POA: Diagnosis not present

## 2021-07-20 DIAGNOSIS — J9601 Acute respiratory failure with hypoxia: Secondary | ICD-10-CM | POA: Diagnosis not present

## 2021-07-20 DIAGNOSIS — R1312 Dysphagia, oropharyngeal phase: Secondary | ICD-10-CM | POA: Diagnosis not present

## 2021-07-20 DIAGNOSIS — M6259 Muscle wasting and atrophy, not elsewhere classified, multiple sites: Secondary | ICD-10-CM | POA: Diagnosis not present

## 2021-07-20 DIAGNOSIS — E1121 Type 2 diabetes mellitus with diabetic nephropathy: Secondary | ICD-10-CM | POA: Diagnosis not present

## 2021-07-20 DIAGNOSIS — U071 COVID-19: Secondary | ICD-10-CM | POA: Diagnosis not present

## 2021-07-20 DIAGNOSIS — R2689 Other abnormalities of gait and mobility: Secondary | ICD-10-CM | POA: Diagnosis not present

## 2021-07-20 DIAGNOSIS — M6281 Muscle weakness (generalized): Secondary | ICD-10-CM | POA: Diagnosis not present

## 2021-07-20 DIAGNOSIS — R262 Difficulty in walking, not elsewhere classified: Secondary | ICD-10-CM | POA: Diagnosis not present

## 2021-07-20 DIAGNOSIS — I5033 Acute on chronic diastolic (congestive) heart failure: Secondary | ICD-10-CM | POA: Diagnosis not present

## 2021-07-20 DIAGNOSIS — K219 Gastro-esophageal reflux disease without esophagitis: Secondary | ICD-10-CM | POA: Diagnosis not present

## 2021-07-20 DIAGNOSIS — R278 Other lack of coordination: Secondary | ICD-10-CM | POA: Diagnosis not present

## 2021-07-20 DIAGNOSIS — R41841 Cognitive communication deficit: Secondary | ICD-10-CM | POA: Diagnosis not present

## 2021-07-20 DIAGNOSIS — R2681 Unsteadiness on feet: Secondary | ICD-10-CM | POA: Diagnosis not present

## 2021-07-22 DIAGNOSIS — M6259 Muscle wasting and atrophy, not elsewhere classified, multiple sites: Secondary | ICD-10-CM | POA: Diagnosis not present

## 2021-07-22 DIAGNOSIS — J9601 Acute respiratory failure with hypoxia: Secondary | ICD-10-CM | POA: Diagnosis not present

## 2021-07-22 DIAGNOSIS — R262 Difficulty in walking, not elsewhere classified: Secondary | ICD-10-CM | POA: Diagnosis not present

## 2021-07-22 DIAGNOSIS — M6281 Muscle weakness (generalized): Secondary | ICD-10-CM | POA: Diagnosis not present

## 2021-07-22 DIAGNOSIS — R2689 Other abnormalities of gait and mobility: Secondary | ICD-10-CM | POA: Diagnosis not present

## 2021-07-22 DIAGNOSIS — I5033 Acute on chronic diastolic (congestive) heart failure: Secondary | ICD-10-CM | POA: Diagnosis not present

## 2021-07-23 DIAGNOSIS — M6281 Muscle weakness (generalized): Secondary | ICD-10-CM | POA: Diagnosis not present

## 2021-07-23 DIAGNOSIS — I5033 Acute on chronic diastolic (congestive) heart failure: Secondary | ICD-10-CM | POA: Diagnosis not present

## 2021-07-23 DIAGNOSIS — J9601 Acute respiratory failure with hypoxia: Secondary | ICD-10-CM | POA: Diagnosis not present

## 2021-07-23 DIAGNOSIS — R2689 Other abnormalities of gait and mobility: Secondary | ICD-10-CM | POA: Diagnosis not present

## 2021-07-23 DIAGNOSIS — R262 Difficulty in walking, not elsewhere classified: Secondary | ICD-10-CM | POA: Diagnosis not present

## 2021-07-23 DIAGNOSIS — M6259 Muscle wasting and atrophy, not elsewhere classified, multiple sites: Secondary | ICD-10-CM | POA: Diagnosis not present

## 2021-07-24 DIAGNOSIS — R262 Difficulty in walking, not elsewhere classified: Secondary | ICD-10-CM | POA: Diagnosis not present

## 2021-07-24 DIAGNOSIS — R2689 Other abnormalities of gait and mobility: Secondary | ICD-10-CM | POA: Diagnosis not present

## 2021-07-24 DIAGNOSIS — M6281 Muscle weakness (generalized): Secondary | ICD-10-CM | POA: Diagnosis not present

## 2021-07-24 DIAGNOSIS — M6259 Muscle wasting and atrophy, not elsewhere classified, multiple sites: Secondary | ICD-10-CM | POA: Diagnosis not present

## 2021-07-24 DIAGNOSIS — J9601 Acute respiratory failure with hypoxia: Secondary | ICD-10-CM | POA: Diagnosis not present

## 2021-07-24 DIAGNOSIS — I5033 Acute on chronic diastolic (congestive) heart failure: Secondary | ICD-10-CM | POA: Diagnosis not present

## 2021-07-25 DIAGNOSIS — I5033 Acute on chronic diastolic (congestive) heart failure: Secondary | ICD-10-CM | POA: Diagnosis not present

## 2021-07-25 DIAGNOSIS — J9601 Acute respiratory failure with hypoxia: Secondary | ICD-10-CM | POA: Diagnosis not present

## 2021-07-25 DIAGNOSIS — R2689 Other abnormalities of gait and mobility: Secondary | ICD-10-CM | POA: Diagnosis not present

## 2021-07-25 DIAGNOSIS — M6259 Muscle wasting and atrophy, not elsewhere classified, multiple sites: Secondary | ICD-10-CM | POA: Diagnosis not present

## 2021-07-25 DIAGNOSIS — R262 Difficulty in walking, not elsewhere classified: Secondary | ICD-10-CM | POA: Diagnosis not present

## 2021-07-25 DIAGNOSIS — M6281 Muscle weakness (generalized): Secondary | ICD-10-CM | POA: Diagnosis not present

## 2021-07-26 DIAGNOSIS — I5033 Acute on chronic diastolic (congestive) heart failure: Secondary | ICD-10-CM | POA: Diagnosis not present

## 2021-07-26 DIAGNOSIS — M6281 Muscle weakness (generalized): Secondary | ICD-10-CM | POA: Diagnosis not present

## 2021-07-26 DIAGNOSIS — J9601 Acute respiratory failure with hypoxia: Secondary | ICD-10-CM | POA: Diagnosis not present

## 2021-07-26 DIAGNOSIS — R262 Difficulty in walking, not elsewhere classified: Secondary | ICD-10-CM | POA: Diagnosis not present

## 2021-07-26 DIAGNOSIS — M6259 Muscle wasting and atrophy, not elsewhere classified, multiple sites: Secondary | ICD-10-CM | POA: Diagnosis not present

## 2021-07-26 DIAGNOSIS — R2689 Other abnormalities of gait and mobility: Secondary | ICD-10-CM | POA: Diagnosis not present

## 2021-07-27 DIAGNOSIS — I5033 Acute on chronic diastolic (congestive) heart failure: Secondary | ICD-10-CM | POA: Diagnosis not present

## 2021-07-27 DIAGNOSIS — R2689 Other abnormalities of gait and mobility: Secondary | ICD-10-CM | POA: Diagnosis not present

## 2021-07-27 DIAGNOSIS — M6281 Muscle weakness (generalized): Secondary | ICD-10-CM | POA: Diagnosis not present

## 2021-07-27 DIAGNOSIS — M6259 Muscle wasting and atrophy, not elsewhere classified, multiple sites: Secondary | ICD-10-CM | POA: Diagnosis not present

## 2021-07-27 DIAGNOSIS — R262 Difficulty in walking, not elsewhere classified: Secondary | ICD-10-CM | POA: Diagnosis not present

## 2021-07-27 DIAGNOSIS — J9601 Acute respiratory failure with hypoxia: Secondary | ICD-10-CM | POA: Diagnosis not present

## 2021-07-28 DIAGNOSIS — R2689 Other abnormalities of gait and mobility: Secondary | ICD-10-CM | POA: Diagnosis not present

## 2021-07-28 DIAGNOSIS — M6281 Muscle weakness (generalized): Secondary | ICD-10-CM | POA: Diagnosis not present

## 2021-07-28 DIAGNOSIS — J9601 Acute respiratory failure with hypoxia: Secondary | ICD-10-CM | POA: Diagnosis not present

## 2021-07-28 DIAGNOSIS — R262 Difficulty in walking, not elsewhere classified: Secondary | ICD-10-CM | POA: Diagnosis not present

## 2021-07-28 DIAGNOSIS — I5033 Acute on chronic diastolic (congestive) heart failure: Secondary | ICD-10-CM | POA: Diagnosis not present

## 2021-07-28 DIAGNOSIS — M6259 Muscle wasting and atrophy, not elsewhere classified, multiple sites: Secondary | ICD-10-CM | POA: Diagnosis not present

## 2021-07-29 DIAGNOSIS — M6259 Muscle wasting and atrophy, not elsewhere classified, multiple sites: Secondary | ICD-10-CM | POA: Diagnosis not present

## 2021-07-29 DIAGNOSIS — I5033 Acute on chronic diastolic (congestive) heart failure: Secondary | ICD-10-CM | POA: Diagnosis not present

## 2021-07-29 DIAGNOSIS — R2689 Other abnormalities of gait and mobility: Secondary | ICD-10-CM | POA: Diagnosis not present

## 2021-07-29 DIAGNOSIS — M6281 Muscle weakness (generalized): Secondary | ICD-10-CM | POA: Diagnosis not present

## 2021-07-29 DIAGNOSIS — R262 Difficulty in walking, not elsewhere classified: Secondary | ICD-10-CM | POA: Diagnosis not present

## 2021-07-29 DIAGNOSIS — J9601 Acute respiratory failure with hypoxia: Secondary | ICD-10-CM | POA: Diagnosis not present

## 2021-07-31 DIAGNOSIS — M6259 Muscle wasting and atrophy, not elsewhere classified, multiple sites: Secondary | ICD-10-CM | POA: Diagnosis not present

## 2021-07-31 DIAGNOSIS — I5033 Acute on chronic diastolic (congestive) heart failure: Secondary | ICD-10-CM | POA: Diagnosis not present

## 2021-07-31 DIAGNOSIS — R2689 Other abnormalities of gait and mobility: Secondary | ICD-10-CM | POA: Diagnosis not present

## 2021-07-31 DIAGNOSIS — R262 Difficulty in walking, not elsewhere classified: Secondary | ICD-10-CM | POA: Diagnosis not present

## 2021-07-31 DIAGNOSIS — J9601 Acute respiratory failure with hypoxia: Secondary | ICD-10-CM | POA: Diagnosis not present

## 2021-07-31 DIAGNOSIS — M6281 Muscle weakness (generalized): Secondary | ICD-10-CM | POA: Diagnosis not present

## 2021-08-01 DIAGNOSIS — R262 Difficulty in walking, not elsewhere classified: Secondary | ICD-10-CM | POA: Diagnosis not present

## 2021-08-01 DIAGNOSIS — M6281 Muscle weakness (generalized): Secondary | ICD-10-CM | POA: Diagnosis not present

## 2021-08-01 DIAGNOSIS — M6259 Muscle wasting and atrophy, not elsewhere classified, multiple sites: Secondary | ICD-10-CM | POA: Diagnosis not present

## 2021-08-01 DIAGNOSIS — J9601 Acute respiratory failure with hypoxia: Secondary | ICD-10-CM | POA: Diagnosis not present

## 2021-08-01 DIAGNOSIS — I5033 Acute on chronic diastolic (congestive) heart failure: Secondary | ICD-10-CM | POA: Diagnosis not present

## 2021-08-01 DIAGNOSIS — R2689 Other abnormalities of gait and mobility: Secondary | ICD-10-CM | POA: Diagnosis not present

## 2021-08-02 DIAGNOSIS — E113593 Type 2 diabetes mellitus with proliferative diabetic retinopathy without macular edema, bilateral: Secondary | ICD-10-CM | POA: Diagnosis not present

## 2021-08-03 DIAGNOSIS — M6259 Muscle wasting and atrophy, not elsewhere classified, multiple sites: Secondary | ICD-10-CM | POA: Diagnosis not present

## 2021-08-03 DIAGNOSIS — R262 Difficulty in walking, not elsewhere classified: Secondary | ICD-10-CM | POA: Diagnosis not present

## 2021-08-03 DIAGNOSIS — M6281 Muscle weakness (generalized): Secondary | ICD-10-CM | POA: Diagnosis not present

## 2021-08-03 DIAGNOSIS — R2689 Other abnormalities of gait and mobility: Secondary | ICD-10-CM | POA: Diagnosis not present

## 2021-08-03 DIAGNOSIS — I5033 Acute on chronic diastolic (congestive) heart failure: Secondary | ICD-10-CM | POA: Diagnosis not present

## 2021-08-03 DIAGNOSIS — J9601 Acute respiratory failure with hypoxia: Secondary | ICD-10-CM | POA: Diagnosis not present

## 2021-08-04 DIAGNOSIS — M6281 Muscle weakness (generalized): Secondary | ICD-10-CM | POA: Diagnosis not present

## 2021-08-04 DIAGNOSIS — J9601 Acute respiratory failure with hypoxia: Secondary | ICD-10-CM | POA: Diagnosis not present

## 2021-08-04 DIAGNOSIS — I5033 Acute on chronic diastolic (congestive) heart failure: Secondary | ICD-10-CM | POA: Diagnosis not present

## 2021-08-04 DIAGNOSIS — R262 Difficulty in walking, not elsewhere classified: Secondary | ICD-10-CM | POA: Diagnosis not present

## 2021-08-04 DIAGNOSIS — R2689 Other abnormalities of gait and mobility: Secondary | ICD-10-CM | POA: Diagnosis not present

## 2021-08-04 DIAGNOSIS — M6259 Muscle wasting and atrophy, not elsewhere classified, multiple sites: Secondary | ICD-10-CM | POA: Diagnosis not present

## 2021-08-05 DIAGNOSIS — J9601 Acute respiratory failure with hypoxia: Secondary | ICD-10-CM | POA: Diagnosis not present

## 2021-08-05 DIAGNOSIS — R2689 Other abnormalities of gait and mobility: Secondary | ICD-10-CM | POA: Diagnosis not present

## 2021-08-05 DIAGNOSIS — M6281 Muscle weakness (generalized): Secondary | ICD-10-CM | POA: Diagnosis not present

## 2021-08-05 DIAGNOSIS — I5033 Acute on chronic diastolic (congestive) heart failure: Secondary | ICD-10-CM | POA: Diagnosis not present

## 2021-08-05 DIAGNOSIS — M6259 Muscle wasting and atrophy, not elsewhere classified, multiple sites: Secondary | ICD-10-CM | POA: Diagnosis not present

## 2021-08-05 DIAGNOSIS — R262 Difficulty in walking, not elsewhere classified: Secondary | ICD-10-CM | POA: Diagnosis not present

## 2021-08-09 ENCOUNTER — Other Ambulatory Visit: Payer: Self-pay

## 2021-08-09 ENCOUNTER — Ambulatory Visit (INDEPENDENT_AMBULATORY_CARE_PROVIDER_SITE_OTHER)
Admission: RE | Admit: 2021-08-09 | Discharge: 2021-08-09 | Disposition: A | Discharge status: Home / Self Care | Payer: Medicare Other | Source: Ambulatory Visit | Attending: Physician Assistant | Admitting: Physician Assistant

## 2021-08-09 ENCOUNTER — Ambulatory Visit (HOSPITAL_COMMUNITY)
Admission: RE | Admit: 2021-08-09 | Discharge: 2021-08-09 | Disposition: A | Discharge status: Home / Self Care | Payer: Medicare Other | Source: Ambulatory Visit | Attending: Physician Assistant | Admitting: Physician Assistant

## 2021-08-09 ENCOUNTER — Ambulatory Visit (INDEPENDENT_AMBULATORY_CARE_PROVIDER_SITE_OTHER): Payer: Medicare Other | Admitting: Physician Assistant

## 2021-08-09 VITALS — BP 144/88 | HR 90 | Temp 98.0°F

## 2021-08-09 DIAGNOSIS — R2689 Other abnormalities of gait and mobility: Secondary | ICD-10-CM | POA: Diagnosis not present

## 2021-08-09 DIAGNOSIS — I739 Peripheral vascular disease, unspecified: Secondary | ICD-10-CM | POA: Diagnosis not present

## 2021-08-09 DIAGNOSIS — M6259 Muscle wasting and atrophy, not elsewhere classified, multiple sites: Secondary | ICD-10-CM | POA: Diagnosis not present

## 2021-08-09 DIAGNOSIS — R262 Difficulty in walking, not elsewhere classified: Secondary | ICD-10-CM | POA: Diagnosis not present

## 2021-08-09 DIAGNOSIS — M6281 Muscle weakness (generalized): Secondary | ICD-10-CM | POA: Diagnosis not present

## 2021-08-09 DIAGNOSIS — J9601 Acute respiratory failure with hypoxia: Secondary | ICD-10-CM | POA: Diagnosis not present

## 2021-08-09 DIAGNOSIS — I5033 Acute on chronic diastolic (congestive) heart failure: Secondary | ICD-10-CM | POA: Diagnosis not present

## 2021-08-09 DIAGNOSIS — I7025 Atherosclerosis of native arteries of other extremities with ulceration: Secondary | ICD-10-CM

## 2021-08-09 NOTE — Progress Notes (Signed)
Office Note     CC:  follow up Requesting Provider:  Lajean Manes, MD  HPI: Heidi Dominguez is a 87 y.o. (01/21/1921) female who presents for evaluation of left foot in relation to PAD.  She is status post left anterior tibial artery stenting on 12/22/2020 due to left great toe ulcer.  She believes the ulcer has completely healed.  She resides at [facility].  She denies any pain or other tissue loss currently.  She is taking aspirin and Plavix since stenting.  She is seen today in a wheelchair however states she is able to walk with a walker and some help.  She denies tobacco use.   Past Medical History:  Diagnosis Date   Acute pancreatitis    hx of    Arthritis    "qwhere" (07/04/2018)   Chronic back pain    "all over" (07/04/2018)   Chronic diastolic CHF (congestive heart failure) (HCC)    Chronic kidney disease    stage  3 chronic kidney disease    Complication of anesthesia    "I have a hard time waking up"   Degenerative joint disease of shoulder region    Dry eye syndrome    Dyspnea    Family history of adverse reaction to anesthesia    "daughter has the shakes when she wakes up"   GERD (gastroesophageal reflux disease)    Heart murmur    High cholesterol    History of hiatal hernia    HOH (hard of hearing)    Hypertension    Impacted cerumen of both ears    hx of    Muscle weakness (generalized)    Osteoarthritis    Overactive bladder    Pancreatitis    hx of    Phlebitis    "BLE"   Rotator cuff tear    right    Tear of right supraspinatus tendon    Thrombocytopenia (HCC)    hx of    Type II diabetes mellitus (HCC)    Urinary tract infection    hx of    Xerosis cutis    hx of     Past Surgical History:  Procedure Laterality Date   ABDOMINAL AORTOGRAM W/LOWER EXTREMITY N/A 12/22/2020   Procedure: ABDOMINAL AORTOGRAM W/LOWER EXTREMITY;  Surgeon: Cherre Robins, MD;  Location: [facility];  Service: Cardiovascular;  Laterality: N/A;    ABDOMINAL HYSTERECTOMY     BACK SURGERY     CATARACT EXTRACTION W/ INTRAOCULAR LENS  IMPLANT, BILATERAL Bilateral    CHOLECYSTECTOMY  2016   ERCP N/A 08/30/2018   Procedure: ENDOSCOPIC RETROGRADE CHOLANGIOPANCREATOGRAPHY (ERCP);  Surgeon: Clarene Essex, MD;  Location: [facility];  Service: Endoscopy;  Laterality: N/A;   FIXATION KYPHOPLASTY     INCISION AND DRAINAGE Right 11/08/2018   Procedure: INCISION AND DRAINAGE right shoulder, placement of antibiotic beads ;  Surgeon: [name], MD;  Location: [facility];  Service: Orthopedics;  Laterality: Right;   JOINT REPLACEMENT     LAPAROSCOPIC CHOLECYSTECTOMY SINGLE SITE WITH INTRAOPERATIVE CHOLANGIOGRAM N/A 04/11/2015   Procedure: LAPAROSCOPIC LYSIS OF ADHESIONS, LAPAROSCOPIC CHOLECYSTECTOMY WITH INTRAOPERATIVE CHOLANGIOGRAM;  Surgeon: Michael Boston, MD;  Location: [facility];  Service: General;  Laterality: N/A;   PANCREATIC STENT PLACEMENT  08/30/2018   Procedure: PANCREATIC STENT PLACEMENT;  Surgeon: Clarene Essex, MD;  Location: [facility];  Service: Endoscopy;;   PERIPHERAL VASCULAR INTERVENTION  12/22/2020   Procedure: PERIPHERAL VASCULAR INTERVENTION;  Surgeon: Cherre Robins, MD;  Location: [place]  [facility];  Service: Cardiovascular;;  left anterior tibial artery   REMOVAL OF STONES  08/30/2018   Procedure: REMOVAL OF STONES;  Surgeon: Clarene Essex, MD;  Location: [facility];  Service: Endoscopy;;   SHOULDER OPEN ROTATOR CUFF REPAIR Bilateral    SPHINCTEROTOMY  08/30/2018   Procedure: SPHINCTEROTOMY;  Surgeon: Clarene Essex, MD;  Location: [facility];  Service: Endoscopy;;   TOTAL KNEE ARTHROPLASTY Bilateral     Social History   Socioeconomic History   Marital status: Widowed    Spouse name: Not on file   Number of children: Not on file   Years of education: Not on file   Highest education level: Not on file  Occupational History   Occupation: retired  Tobacco Use   Smoking status: Never   Smokeless tobacco: Never  Vaping Use    Vaping Use: Never used  Substance and Sexual Activity   Alcohol use: No    Comment: rare   Drug use: No   Sexual activity: Not on file  Other Topics Concern   Not on file  Social History Narrative   Not on file   Social Determinants of Health   Financial Resource Strain: Not on file  Food Insecurity: Not on file  Transportation Needs: Not on file  Physical Activity: Not on file  Stress: Not on file  Social Connections: Not on file  Intimate Partner Violence: Not on file    Family History  Problem Relation Age of Onset   Hypertension Mother    Diabetes Mellitus I Mother    Hypertension Father     Current Outpatient Medications  Medication Sig Dispense Refill   acetaminophen (TYLENOL) 650 MG CR tablet Take 1,300 mg by mouth every 8 (eight) hours as needed for pain.     albuterol (VENTOLIN HFA) 108 (90 Base) MCG/ACT inhaler Inhale 2 puffs into the lungs every 6 (six) hours as needed for wheezing or shortness of breath.     amLODipine (NORVASC) 10 MG tablet Take 10 mg by mouth daily.     aspirin EC 81 MG tablet Take 81 mg by mouth at bedtime.     Carboxymethylcellulose Sod PF (REFRESH PLUS) 0.5 % SOLN Place 1 drop into both eyes daily as needed (eye injections).     cetirizine (ZYRTEC) 10 MG tablet Take 10 mg by mouth at bedtime.     Cholecalciferol (VITAMIN D-3) 125 MCG (5000 UT) TABS Take 5,000 Units by mouth every Monday.     clopidogrel (PLAVIX) 75 MG tablet Take 1 tablet (75 mg total) by mouth daily. 90 tablet 3   famotidine (PEPCID) 20 MG tablet Take one after  supper 30 tablet 2   ferrous sulfate 325 (65 FE) MG tablet Take 325 mg by mouth at bedtime.     fluconazole (DIFLUCAN) 150 MG tablet Take 1 tablet by mouth once. May repeat in 3 days if symptoms persist. 2 tablet 0   furosemide (LASIX) 20 MG tablet Take 20 mg by mouth daily.     JANUVIA 50 MG tablet Take 50 mg by mouth daily.   5   levalbuterol (XOPENEX) 1.25 MG/3ML nebulizer solution Take 1.25 mg by nebulization  every 4 (four) hours as needed for shortness of breath or wheezing.     lidocaine (XYLOCAINE) 5 % ointment APPLY TOPICALLY AS NEEDED. TOES FOR PAIN 35.44 g 0   loratadine (CLARITIN) 10 MG tablet 1 tablet     losartan (COZAAR) 25 MG tablet Take 25 mg by mouth daily.  Multiple Vitamin (MULTIVITAMIN WITH MINERALS) TABS tablet Take 1 tablet by mouth daily. One a Day for 65 +     Multiple Vitamins-Minerals (PRESERVISION AREDS 2) CAPS Take 1 capsule by mouth every morning.     ondansetron (ZOFRAN) 4 MG tablet Take 4 mg by mouth every 8 (eight) hours as needed for nausea or vomiting.     ONETOUCH VERIO test strip USE TO CHECK BLOOD SUGAR ONCE DAILY.     pantoprazole (PROTONIX) 40 MG tablet Take 1 tablet (40 mg total) by mouth daily. Take 30-60 min before first meal of the day 30 tablet 5   potassium chloride (K-DUR) 10 MEQ tablet Take 10 mEq by mouth daily.     SYSTANE COMPLETE 0.6 % SOLN Place 2 drops into both eyes 2 (two) times daily.     vitamin B-12 (CYANOCOBALAMIN) 1000 MCG tablet Take 1,000 mcg by mouth every Sunday.     No current facility-administered medications for this visit.    Allergies  Allergen Reactions   Amoxicillin-Pot Clavulanate Diarrhea   Catapres [Clonidine Hcl] Other (See Comments)    Unknown.    Codeine Other (See Comments)    "Makes me out of my head, loopy"   Hydrocodone Other (See Comments)    "makes me go out of my head, loopy"   Lisinopril Cough   Other Other (See Comments)    Any type of narcotics Feels "loopy"    Sulfa Antibiotics Itching and Other (See Comments)   Sulfamethoxazole Diarrhea   Verapamil Diarrhea and Other (See Comments)   Sulfasalazine Itching     REVIEW OF SYSTEMS:   [X]  denotes positive finding, [ ]  denotes negative finding Cardiac  Comments:  Chest pain or chest pressure:    Shortness of breath upon exertion:    Short of breath when lying flat:    Irregular heart rhythm:        Vascular    Pain in calf, thigh, or hip  brought on by ambulation:    Pain in feet at night that wakes you up from your sleep:     Blood clot in your veins:    Leg swelling:         Pulmonary    Oxygen at home:    Productive cough:     Wheezing:         Neurologic    Sudden weakness in arms or legs:     Sudden numbness in arms or legs:     Sudden onset of difficulty speaking or slurred speech:    Temporary loss of vision in one eye:     Problems with dizziness:         Gastrointestinal    Blood in stool:     Vomited blood:         Genitourinary    Burning when urinating:     Blood in urine:        Psychiatric    Major depression:         Hematologic    Bleeding problems:    Problems with blood clotting too easily:        Skin    Rashes or ulcers:        Constitutional    Fever or chills:      PHYSICAL EXAMINATION:  Vitals:   08/09/21 1342  BP: (!) 144/88  Pulse: 90  Temp: 98 F (36.7 C)  TempSrc: Skin  SpO2: 97%    General:  WDWN in NAD; vital  signs documented above Gait: Not observed HENT: WNL, normocephalic Pulmonary: normal non-labored breathing , without Rales, rhonchi,  wheezing Cardiac: regular HR Abdomen: soft, NT, no masses Skin: without rashes Vascular Exam/Pulses:  Right Left  DP absent Brisk by doppler  PT absent Brisk by doppler   Extremities: Ulcer of the base of left great toe completely healed there is now a callus without signs of infection or drainage Musculoskeletal: no muscle wasting or atrophy  Neurologic: A&O X 3;  No focal weakness or paresthesias are detected Psychiatric:  The pt has Normal affect.   Non-Invasive Vascular Imaging:   Patent left anterior tibial artery Incompressible tibial vessels however TBI unchanged 0.49    ASSESSMENT/PLAN:: 87 y.o. female here for follow up for PAD and left great toe ulcer  -Ulcer has completely healed on left foot -Anterior tibial artery remains patent by duplex and TBI is unchanged since last office visit -Okay to  continue aspirin and Plavix as long as patient is able to tolerate -I discussed continued surveillance versus following up as needed with the patient and her daughter.  We both came to the agreement that we will not schedule any further surveillance in the future however if new symptoms or problems were to arise the patient can always call and be scheduled to be seen   Dagoberto Ligas, PA-C [facility] [phone]  Clinic MD:   Stanford Breed

## 2021-08-10 DIAGNOSIS — R262 Difficulty in walking, not elsewhere classified: Secondary | ICD-10-CM | POA: Diagnosis not present

## 2021-08-10 DIAGNOSIS — M6281 Muscle weakness (generalized): Secondary | ICD-10-CM | POA: Diagnosis not present

## 2021-08-10 DIAGNOSIS — J9601 Acute respiratory failure with hypoxia: Secondary | ICD-10-CM | POA: Diagnosis not present

## 2021-08-10 DIAGNOSIS — R2689 Other abnormalities of gait and mobility: Secondary | ICD-10-CM | POA: Diagnosis not present

## 2021-08-10 DIAGNOSIS — M6259 Muscle wasting and atrophy, not elsewhere classified, multiple sites: Secondary | ICD-10-CM | POA: Diagnosis not present

## 2021-08-10 DIAGNOSIS — I5033 Acute on chronic diastolic (congestive) heart failure: Secondary | ICD-10-CM | POA: Diagnosis not present

## 2021-08-11 DIAGNOSIS — M6259 Muscle wasting and atrophy, not elsewhere classified, multiple sites: Secondary | ICD-10-CM | POA: Diagnosis not present

## 2021-08-11 DIAGNOSIS — R2689 Other abnormalities of gait and mobility: Secondary | ICD-10-CM | POA: Diagnosis not present

## 2021-08-11 DIAGNOSIS — I5033 Acute on chronic diastolic (congestive) heart failure: Secondary | ICD-10-CM | POA: Diagnosis not present

## 2021-08-11 DIAGNOSIS — M6281 Muscle weakness (generalized): Secondary | ICD-10-CM | POA: Diagnosis not present

## 2021-08-11 DIAGNOSIS — R262 Difficulty in walking, not elsewhere classified: Secondary | ICD-10-CM | POA: Diagnosis not present

## 2021-08-11 DIAGNOSIS — J9601 Acute respiratory failure with hypoxia: Secondary | ICD-10-CM | POA: Diagnosis not present

## 2021-08-12 DIAGNOSIS — R2689 Other abnormalities of gait and mobility: Secondary | ICD-10-CM | POA: Diagnosis not present

## 2021-08-12 DIAGNOSIS — J9601 Acute respiratory failure with hypoxia: Secondary | ICD-10-CM | POA: Diagnosis not present

## 2021-08-12 DIAGNOSIS — M6259 Muscle wasting and atrophy, not elsewhere classified, multiple sites: Secondary | ICD-10-CM | POA: Diagnosis not present

## 2021-08-12 DIAGNOSIS — I5033 Acute on chronic diastolic (congestive) heart failure: Secondary | ICD-10-CM | POA: Diagnosis not present

## 2021-08-12 DIAGNOSIS — M6281 Muscle weakness (generalized): Secondary | ICD-10-CM | POA: Diagnosis not present

## 2021-08-12 DIAGNOSIS — R262 Difficulty in walking, not elsewhere classified: Secondary | ICD-10-CM | POA: Diagnosis not present

## 2022-07-14 ENCOUNTER — Other Ambulatory Visit (HOSPITAL_COMMUNITY): Payer: Self-pay | Admitting: *Deleted

## 2022-07-14 DIAGNOSIS — R1310 Dysphagia, unspecified: Secondary | ICD-10-CM

## 2022-07-14 DIAGNOSIS — R059 Cough, unspecified: Secondary | ICD-10-CM

## 2022-07-20 ENCOUNTER — Ambulatory Visit (HOSPITAL_COMMUNITY)
Admission: RE | Admit: 2022-07-20 | Discharge: 2022-07-20 | Disposition: A | Discharge status: Home / Self Care | Payer: Medicare PPO | Source: Ambulatory Visit | Attending: Gastroenterology | Admitting: Gastroenterology

## 2022-07-20 DIAGNOSIS — R059 Cough, unspecified: Secondary | ICD-10-CM

## 2022-07-20 DIAGNOSIS — K219 Gastro-esophageal reflux disease without esophagitis: Secondary | ICD-10-CM | POA: Diagnosis not present

## 2022-07-20 DIAGNOSIS — R1310 Dysphagia, unspecified: Secondary | ICD-10-CM | POA: Insufficient documentation

## 2022-07-20 NOTE — Progress Notes (Signed)
   07/20/22 1100  General Information  HPI Pt is a 88 year old female arriving for OP MBS from [facility]. Pt has a history of esophageal dysphagia with dilatation. In her last MBS in 2021 pt again found to have normal oropharyngeal function but risk of postprandial aspiration. Daughter reports Dr Watt Climes is considering endoscopy and MBS is to rule out pharyngeal dysphagia. Daughter reports pt has frequent coughing and expectoration of mucous. Pt consumes unrestricted diet but favors soft meats. Meds are crushed in puree.  Type of Study Bedside Swallow Evaluation  Diet Prior to this Study Regular;Thin liquids  Respiratory Status Room air  History of Recent Intubation No  Behavior/Cognition Alert;Cooperative;Pleasant mood  Oral Cavity Assessment WFL  Oral Care Completed by SLP No  Oral Cavity - Dentition Edentulous;Dentures, top;Dentures, bottom  Vision Functional for self-feeding  Self-Feeding Abilities Able to feed self  Patient Positioning Upright in chair  Baseline Vocal Quality Normal  Volitional Cough Strong  Volitional Swallow Able to elicit  Oral Motor/Sensory Function  Overall Oral Motor/Sensory Function WFL  Thin Liquid  Thin Liquid WFL  Nectar Thick Liquid  Nectar Thick Liquid NT  Honey Thick Liquid  Honey Thick Liquid NT  Puree  Puree WFL  Solid  Solid WFL  Presentation Self Fed  SLP Assessment  Clinical Impression Statement (ACUTE ONLY) Pt demonstrates oral and ororpharyngeal function WNL, particularly for age. Pt has consistent airway protection. She naturally take single sips with good timing and closure. When pressed to drink consecutively for several seconds pt had trace flash penetration on final sip as breath support wore out, though again, airway protection still intact. Pt is able to masticate and swallow solids without residue. After a few oz of intake of solids and liquids pt began belching and reports pressure. Esophageal sweep showed barium throughout esophageal  column. Reinforced esophageal precautions with daughter and pt, particularly benefit of 30 degree position for sleep to reduce night time GERD. No further recommendations needed.  SLP Visit Diagnosis Dysphagia, unspecified (R13.10)  Impact on safety and function Mild aspiration risk  Other Related Risk Factors History of esophageal-related issues;History of GERD  Swallow Evaluation Recommendations  Recommended Consults Other (Comment) (GI already following)  Liquid Administration via Cup;Straw  Medication Administration Crushed with puree  Supervision Patient able to self feed  Compensations Slow rate;Small sips/bites;Follow solids with liquid  Postural Changes Seated upright at 90 degrees;Remain upright for at least 30 minutes after po intake  Treatment Plan  Treatment Recommendations No treatment recommended at this time  Follow Up Recommendations No SLP follow up  Individuals Consulted  Consulted and Agree with Results and Recommendations Patient;Family member/caregiver  Family Member Consulted daughter  SLP Time Calculation  SLP Start Time (ACUTE ONLY) 1150  SLP Stop Time (ACUTE ONLY) 1210  SLP Time Calculation (min) (ACUTE ONLY) 20 min  SLP Evaluations  $ SLP Speech Visit 1 Visit  SLP Evaluations  $Outpatient MBS Swallow 1 Procedure

## 2022-09-27 ENCOUNTER — Ambulatory Visit (INDEPENDENT_AMBULATORY_CARE_PROVIDER_SITE_OTHER): Payer: Medicare PPO | Admitting: Nurse Practitioner

## 2022-09-27 ENCOUNTER — Encounter: Payer: Self-pay | Admitting: Nurse Practitioner

## 2022-09-27 ENCOUNTER — Ambulatory Visit (INDEPENDENT_AMBULATORY_CARE_PROVIDER_SITE_OTHER): Payer: Medicare PPO

## 2022-09-27 VITALS — BP 128/78 | HR 111 | Wt 171.2 lb

## 2022-09-27 DIAGNOSIS — J40 Bronchitis, not specified as acute or chronic: Secondary | ICD-10-CM | POA: Insufficient documentation

## 2022-09-27 DIAGNOSIS — J441 Chronic obstructive pulmonary disease with (acute) exacerbation: Secondary | ICD-10-CM | POA: Insufficient documentation

## 2022-09-27 DIAGNOSIS — R600 Localized edema: Secondary | ICD-10-CM

## 2022-09-27 DIAGNOSIS — J209 Acute bronchitis, unspecified: Secondary | ICD-10-CM

## 2022-09-27 DIAGNOSIS — J449 Chronic obstructive pulmonary disease, unspecified: Secondary | ICD-10-CM | POA: Insufficient documentation

## 2022-09-27 DIAGNOSIS — K219 Gastro-esophageal reflux disease without esophagitis: Secondary | ICD-10-CM | POA: Diagnosis not present

## 2022-09-27 DIAGNOSIS — R062 Wheezing: Secondary | ICD-10-CM

## 2022-09-27 LAB — CBC WITH DIFFERENTIAL/PLATELET
Basophils Absolute: 0 10*3/uL (ref 0.0–0.1)
Basophils Relative: 0.4 % (ref 0.0–3.0)
Eosinophils Absolute: 0.1 10*3/uL (ref 0.0–0.7)
Eosinophils Relative: 0.8 % (ref 0.0–5.0)
HCT: 38.3 % (ref 36.0–46.0)
Hemoglobin: 12.2 g/dL (ref 12.0–15.0)
Lymphocytes Relative: 13 % (ref 12.0–46.0)
Lymphs Abs: 1 10*3/uL (ref 0.7–4.0)
MCHC: 31.9 g/dL (ref 30.0–36.0)
MCV: 87 fl (ref 78.0–100.0)
Monocytes Absolute: 0.7 10*3/uL (ref 0.1–1.0)
Monocytes Relative: 8.9 % (ref 3.0–12.0)
Neutro Abs: 5.8 10*3/uL (ref 1.4–7.7)
Neutrophils Relative %: 76.9 % (ref 43.0–77.0)
Platelets: 240 10*3/uL (ref 150.0–400.0)
RBC: 4.4 Mil/uL (ref 3.87–5.11)
RDW: 15.3 % (ref 11.5–15.5)
WBC: 7.6 10*3/uL (ref 4.0–10.5)

## 2022-09-27 LAB — BASIC METABOLIC PANEL
BUN: 10 mg/dL (ref 6–23)
CO2: 37 mEq/L — ABNORMAL HIGH (ref 19–32)
Calcium: 9.9 mg/dL (ref 8.4–10.5)
Chloride: 85 mEq/L — ABNORMAL LOW (ref 96–112)
Creatinine, Ser: 0.53 mg/dL (ref 0.40–1.20)
GFR: 76.34 mL/min (ref >60.00)
Glucose, Bld: 254 mg/dL — ABNORMAL HIGH (ref 70–99)
Potassium: 4 mEq/L (ref 3.5–5.1)
Sodium: 127 mEq/L — ABNORMAL LOW (ref 135–145)

## 2022-09-27 LAB — BRAIN NATRIURETIC PEPTIDE: Pro B Natriuretic peptide (BNP): 77 pg/mL (ref 0.0–100.0)

## 2022-09-27 MED ORDER — AZITHROMYCIN 250 MG PO TABS: ORAL_TABLET | ORAL | 0 refills | Status: DC

## 2022-09-27 MED ORDER — PREDNISONE 20 MG PO TABS: 40.0000 mg | ORAL_TABLET | Freq: Every day | ORAL | 0 refills | Status: DC

## 2022-09-27 MED ORDER — PROMETHAZINE-DM 6.25-15 MG/5ML PO SYRP: 5.0000 mL | ORAL_SOLUTION | Freq: Three times a day (TID) | ORAL | 0 refills | Status: DC | PRN

## 2022-09-27 MED ORDER — IPRATROPIUM-ALBUTEROL 0.5-2.5 (3) MG/3ML IN SOLN: 3.0000 mL | Freq: Four times a day (QID) | RESPIRATORY_TRACT | Status: DC | PRN

## 2022-09-27 MED ORDER — METHYLPREDNISOLONE ACETATE 80 MG/ML IJ SUSP: 80.0000 mg | Freq: Once | INTRAMUSCULAR | Status: DC

## 2022-09-27 NOTE — Patient Instructions (Addendum)
Continue Albuterol inhaler 2 puffs or 3 mL neb every 6 hours as needed for shortness of breath or wheezing. Notify if symptoms persist despite rescue inhaler/neb use. Continue benzonatate 1 capsule Three times a day for cough Continue loratadine 1 tablet  Continue famotidine 1 tab daily Increase pantoprazole 1 tab to twice daily for the next six weeks. Elevate head of bed at least 30 degrees at night.   Prednisone 40 mg daily for 5 days. Take in AM with food. Start tomorrow Promethazine DM cough syrup 5 mL every 8 hours as needed for cough. Use with caution. May cause drowsiness and increase risk for falls Azithromycin - take 2 tabs on day one then 1 tab daily for four additional days. Take with food   Follow up in 2 weeks with new pulmonologist (30 min consult slot) or Heidi Clancey Welton,NP if no availability. If symptoms do not improve or worsen, please contact office for sooner follow up or seek emergency care.

## 2022-09-27 NOTE — Assessment & Plan Note (Addendum)
Persistent bronchitic type cough with associated wheezing. Possible reactive airway response secondary to URI. No evidence of superimposed infection or volume overload on imaging. We will treat her with empiric azithromycin, depo 80 mg inj x 1 and prednisone burst. She responded well to duoneb in office - advised they continue these at least 3 times a day until symptoms improve. Action plan in place with ED/return precautions. VS stable on room air in office. CBC today to evaluate for leukocytosis, eosinophilia and anemia. Likely that hiatal hernia/GERD contributing; we will increase her PPI to twice daily for the next 6 weeks. She does have some BLE edema on exam - we will check BNP to ensure there is no component of HF driving her symptoms.   Patient Instructions  Continue Albuterol inhaler 2 puffs or 3 mL neb every 6 hours as needed for shortness of breath or wheezing. Notify if symptoms persist despite rescue inhaler/neb use. Continue benzonatate 1 capsule Three times a day for cough Continue loratadine 1 tablet  Continue famotidine 1 tab daily Increase pantoprazole 1 tab to twice daily for the next six weeks. Elevate head of bed at least 30 degrees at night.   Prednisone 40 mg daily for 5 days. Take in AM with food. Start tomorrow Promethazine DM cough syrup 5 mL every 8 hours as needed for cough. Use with caution. May cause drowsiness and increase risk for falls Azithromycin - take 2 tabs on day one then 1 tab daily for four additional days. Take with food   Follow up in 2 weeks with new pulmonologist (30 min consult slot) or Katie Dimple Bastyr,NP if no availability. If symptoms do not improve or worsen, please contact office for sooner follow up or seek emergency care.

## 2022-09-27 NOTE — Assessment & Plan Note (Signed)
Large hiatal hernia. She would not be a candidate for repair given her frailty and age. See above plan regarding GERD management. Follow up with GI as scheduled.

## 2022-09-27 NOTE — Progress Notes (Unsigned)
$@PatientS$  ID: Heidi Dominguez, female    DOB: 1921-07-12, 89 y.o.   MRN: [id number]  Chief Complaint  Patient presents with   Follow-up    Pt f/u daighter says that she has been coughing and wheezing for 2 months, CXR was done and negative for PNA, they have tried inhalers, nebs and antibiotics. It has not gotten better. Swallow study done by [facility] but we do not have results.     Referring provider: Lajean Manes, MD  HPI: 89 year old female, never smoker followed for upper airway cough. She was previously followed by Dr. Melvyn Novas (last seen 2020) and last seen in office 09/15/2019 by Parrett,NP. Past medical history significant for HTN, CHF, PAD, GERD, DM II, CKD stage 3, HLD.  TEST/EVENTS:  03/16/2021 CTA chest:no PE. Stable thyroid nodule. bandlike scarring at lung apices R>L. Mild dependent atelectasis in both lungs and LLL. Large hiatal hernia.  07/20/2022: laryngeal penetration; mild aspiration risk; GERD precautions advised  09/15/2019: OV with Parrett,NP. Recommended to d/c lisinopril at previous visit. She was started on losartan for BP control. Doing much better. Cough substantially decreased. Still has some nasal congestion/drainage. Overall improved.   09/27/2022: Today - acute Patient presents today for acute visit with her daughter. She has been having trouble with a cough and wheezing for going on two months now. She tells me that she will sometimes get some phlegm up, which is white. She gets a little short of breath with coughing spells but otherwise breathing feels stable. She had a chest x ray around 4-5 weeks ago with her PCP which they were told did not show any pneumonia. They did treat her with doxycycline course and started her on neb treatments but she didn't have much change. She denies hemoptysis, leg swelling, calf pain, orthopnea, fevers, chills. Eating and drinking normally. Her daughter is not sure how often they are giving her breathing treatments at her facility.   She's also been having trouble with eating and getting choked/spitting up clear phlegm with this so she went for a modified barium swallow study ordered by her GI physician. This was completed in December but they're not sure what the results were. Per results in her chart, no obvious signs of aspiration but she did have some laryngeal penetration. She was advised on GERD precautions, especially at night. Her daughter tells me that her GI provider has been debating whether or not to do an EGD for further evaluation. She does have a known large hiatal hernia.   Allergies  Allergen Reactions   Augmentin [Amoxicillin-Pot Clavulanate] Diarrhea   Bactrim [Sulfamethoxazole-Trimethoprim] Diarrhea and Itching   Calan [Verapamil] Diarrhea   Catapres [Clonidine Hcl] Other (See Comments)    Unknown reaction Not documented on MAR    Codeine Other (See Comments)    "Makes me out of my head, loopy"   Hydrocodone Other (See Comments)    "makes me go out of my head, loopy"   Other Other (See Comments)    Any type of narcotics Feels "loopy"    Sulfa Antibiotics Itching   Zestril [Lisinopril] Cough   Azulfidine [Sulfasalazine] Itching    Immunization History  Administered Date(s) Administered   Influenza Split 06/02/2010, 06/29/2011, 05/17/2012, 05/28/2013, 05/27/2014, 06/10/2015   Influenza, High Dose Seasonal PF 05/24/2016, 05/02/2017, 06/25/2018, 07/14/2020   Influenza-Unspecified 07/31/2019   PFIZER(Purple Top)SARS-COV-2 Vaccination 09/24/2019, 10/05/2019, 10/29/2019, 09/14/2020   PPD Test 04/13/2015   Pneumococcal Conjugate-13 11/18/2013   Pneumococcal Polysaccharide-23 08/30/2000, 11/15/2012   Tdap 05/17/2012  Past Medical History:  Diagnosis Date   Acute pancreatitis    hx of    Arthritis    "qwhere" (07/04/2018)   Chronic back pain    "all over" (07/04/2018)   Chronic diastolic CHF (congestive heart failure) (HCC)    Chronic kidney disease    stage  3 chronic kidney disease     Complication of anesthesia    "I have a hard time waking up"   Degenerative joint disease of shoulder region    Dry eye syndrome    Dyspnea    Family history of adverse reaction to anesthesia    "daughter has the shakes when she wakes up"   GERD (gastroesophageal reflux disease)    Heart murmur    High cholesterol    History of hiatal hernia    HOH (hard of hearing)    Hypertension    Impacted cerumen of both ears    hx of    Muscle weakness (generalized)    Osteoarthritis    Overactive bladder    Pancreatitis    hx of    Phlebitis    "BLE"   Rotator cuff tear    right    Tear of right supraspinatus tendon    Thrombocytopenia (HCC)    hx of    Type II diabetes mellitus (HCC)    Urinary tract infection    hx of    UTI (urinary tract infection) 09/12/2018   Xerosis cutis    hx of     Tobacco History: Social History   Tobacco Use  Smoking Status Never  Smokeless Tobacco Never   Counseling given: Not Answered   No facility-administered medications prior to visit.   Outpatient Medications Prior to Visit  Medication Sig Dispense Refill   ipratropium-albuterol (DUONEB) 0.5-2.5 (3) MG/3ML SOLN Take 3 mLs by nebulization 3 (three) times daily.     lidocaine (XYLOCAINE) 5 % ointment APPLY TOPICALLY AS NEEDED. TOES FOR PAIN (Patient not taking: Reported on 09/28/2022) 35.44 g 0   acetaminophen (TYLENOL) 650 MG CR tablet Take 1,300 mg by mouth every 8 (eight) hours as needed for pain.     albuterol (VENTOLIN HFA) 108 (90 Base) MCG/ACT inhaler Inhale 2 puffs into the lungs every 6 (six) hours as needed for wheezing or shortness of breath.     amLODipine (NORVASC) 10 MG tablet Take 10 mg by mouth daily.     aspirin EC 81 MG tablet Take 81 mg by mouth at bedtime.     benzonatate (TESSALON) 200 MG capsule Take 200 mg by mouth 3 (three) times daily as needed.     Carboxymethylcellulose Sod PF (REFRESH PLUS) 0.5 % SOLN Place 1 drop into both eyes daily as needed (eye  injections).     Cholecalciferol (VITAMIN D-3) 125 MCG (5000 UT) TABS Take 5,000 Units by mouth every Monday.     furosemide (LASIX) 20 MG tablet Take 20 mg by mouth daily.     levalbuterol (XOPENEX) 1.25 MG/3ML nebulizer solution Take 1.25 mg by nebulization every 4 (four) hours as needed for shortness of breath or wheezing.     loratadine (CLARITIN) 10 MG tablet 1 tablet     losartan (COZAAR) 25 MG tablet Take 25 mg by mouth daily.     Multiple Vitamin (MULTIVITAMIN WITH MINERALS) TABS tablet Take 1 tablet by mouth daily. One a Day for 65 +     ondansetron (ZOFRAN) 4 MG tablet Take 4 mg by mouth every 8 (eight) hours as  needed for nausea or vomiting.     ONETOUCH VERIO test strip USE TO CHECK BLOOD SUGAR ONCE DAILY.     potassium chloride (K-DUR) 10 MEQ tablet Take 10 mEq by mouth daily.     clopidogrel (PLAVIX) 75 MG tablet Take 1 tablet (75 mg total) by mouth daily. (Patient not taking: Reported on 09/27/2022) 90 tablet 3   famotidine (PEPCID) 20 MG tablet Take one after  supper (Patient not taking: Reported on 09/27/2022) 30 tablet 2   cetirizine (ZYRTEC) 10 MG tablet Take 10 mg by mouth at bedtime.     ferrous sulfate 325 (65 FE) MG tablet Take 325 mg by mouth at bedtime. (Patient not taking: Reported on 09/27/2022)     fluconazole (DIFLUCAN) 150 MG tablet Take 1 tablet by mouth once. May repeat in 3 days if symptoms persist. 2 tablet 0   JANUVIA 50 MG tablet Take 50 mg by mouth daily.   5   Multiple Vitamins-Minerals (PRESERVISION AREDS 2) CAPS Take 1 capsule by mouth every morning.     pantoprazole (PROTONIX) 40 MG tablet Take 1 tablet (40 mg total) by mouth daily. Take 30-60 min before first meal of the day 30 tablet 5   SYSTANE COMPLETE 0.6 % SOLN Place 2 drops into both eyes 2 (two) times daily.     vitamin B-12 (CYANOCOBALAMIN) 1000 MCG tablet Take 1,000 mcg by mouth every Sunday.       Review of Systems:   Constitutional: No weight loss or gain, night sweats, fevers, chills.  +fatigue, lassitude. HEENT: No headaches, tooth/dental problems, or sore throat. No sneezing, itching, ear ache, nasal congestion, or post nasal drip. +difficulty swallowing CV:  No chest pain, orthopnea, PND, swelling in lower extremities, anasarca, dizziness, palpitations, syncope Resp: +shortness of breath with exertion; wheezing; cough. No excess mucus or change in color of mucus. No hemoptysis.  No chest wall deformity GI:  +heartburn, indigestion. No abdominal pain, nausea, vomiting, diarrhea, change in bowel habits, loss of appetite, bloody stools.  GU: No dysuria, change in color of urine, urgency or frequency.   Skin: No rash, lesions, ulcerations MSK:  No joint pain or swelling.  Neuro: No dizziness or lightheadedness.  Psych: No depression or anxiety. Mood stable.     Physical Exam:  BP 128/78   Pulse (!) 111   Wt 171 lb 3.2 oz (77.7 kg)   SpO2 97%   BMI 29.39 kg/m   GEN: Pleasant, interactive, well-kempt; elderly; in no acute distress HEENT:  Normocephalic and atraumatic. PERRLA. Sclera white. Nasal turbinates pink, moist and patent bilaterally. No rhinorrhea present. Oropharynx pink and moist, without exudate or edema. No lesions, ulcerations, or postnasal drip.  NECK:  Supple w/ fair ROM. No JVD present. Normal carotid impulses w/o bruits. Thyroid symmetrical with no goiter or nodules palpated. No lymphadenopathy.   CV: RRR, no m/r/g, +1 BLE edema. Pulses intact, +2 bilaterally. No cyanosis, pallor or clubbing. PULMONARY:  Unlabored, regular breathing. Scattered wheezes/rhonchi bilaterally A&P. Bronchitic cough. No accessory muscle use.  GI: BS present and normoactive. Soft, non-tender to palpation. No organomegaly or masses detected.  MSK: No erythema, warmth or tenderness. Cap refil <2 sec all extrem. No deformities or joint swelling noted.  Neuro: A/Ox3. No focal deficits noted.   Skin: Warm, no lesions or rashe Psych: Normal affect and behavior. Judgement and thought  content appropriate.     Lab Results:  CBC    Component Value Date/Time   WBC 7.5 09/28/2022 1106  RBC 4.38 09/28/2022 1106   HGB 12.4 09/28/2022 1106   HCT 38.8 09/28/2022 1106   PLT 225 09/28/2022 1106   MCV 88.6 09/28/2022 1106   MCH 28.3 09/28/2022 1106   MCHC 32.0 09/28/2022 1106   RDW 14.7 09/28/2022 1106   LYMPHSABS 1.3 09/28/2022 1106   MONOABS 0.6 09/28/2022 1106   EOSABS 0.1 09/28/2022 1106   BASOSABS 0.0 09/28/2022 1106    BMET    Component Value Date/Time   NA 130 (L) 09/28/2022 1106   K 4.2 09/28/2022 1106   CL 88 (L) 09/28/2022 1106   CO2 32 09/28/2022 1106   GLUCOSE 287 (H) 09/28/2022 1106   BUN 8 09/28/2022 1106   CREATININE 0.64 09/28/2022 1106   CREATININE 0.81 02/09/2021 1641   CALCIUM 9.4 09/28/2022 1106   GFRNONAA >60 09/28/2022 1106   GFRNONAA 39 (L) 01/12/2021 0000   GFRAA 45 (L) 01/12/2021 0000    BNP    Component Value Date/Time   BNP 36.2 03/14/2021 1425     Imaging:          No data to display          No results found for: "NITRICOXIDE"      Assessment & Plan:   Bronchitis Persistent bronchitic type cough with associated wheezing. Possible reactive airway response secondary to URI. No evidence of superimposed infection or volume overload on imaging. We will treat her with empiric azithromycin, depo 80 mg inj x 1 and prednisone burst. She responded well to duoneb in office - advised they continue these at least 3 times a day until symptoms improve. Action plan in place with ED/return precautions. VS stable on room air in office. CBC today to evaluate for leukocytosis, eosinophilia and anemia. Likely that hiatal hernia/GERD contributing; we will increase her PPI to twice daily for the next 6 weeks. She does have some BLE edema on exam - we will check BNP to ensure there is no component of HF driving her symptoms.   Patient Instructions  Continue Albuterol inhaler 2 puffs or 3 mL neb every 6 hours as needed for  shortness of breath or wheezing. Notify if symptoms persist despite rescue inhaler/neb use. Continue benzonatate 1 capsule Three times a day for cough Continue loratadine 1 tablet  Continue famotidine 1 tab daily Increase pantoprazole 1 tab to twice daily for the next six weeks. Elevate head of bed at least 30 degrees at night.   Prednisone 40 mg daily for 5 days. Take in AM with food. Start tomorrow Promethazine DM cough syrup 5 mL every 8 hours as needed for cough. Use with caution. May cause drowsiness and increase risk for falls Azithromycin - take 2 tabs on day one then 1 tab daily for four additional days. Take with food   Follow up in 2 weeks with new pulmonologist (30 min consult slot) or Katie Kandy Towery,NP if no availability. If symptoms do not improve or worsen, please contact office for sooner follow up or seek emergency care.    Gastroesophageal reflux disease Large hiatal hernia. She would not be a candidate for repair given her frailty and age. See above plan regarding GERD management. Follow up with GI as scheduled.   Bilateral lower extremity edema BLE edema on exam today. See above plan.   I spent 45 minutes of dedicated to the care of this patient on the date of this encounter to include pre-visit review of records, face-to-face time with the patient discussing conditions above, post visit ordering  of testing, clinical documentation with the electronic health record, making appropriate referrals as documented, and communicating necessary findings to members of the patients care team.  Clayton Bibles, NP 09/28/2022  Pt aware and understands NP's role.

## 2022-09-28 ENCOUNTER — Encounter (HOSPITAL_COMMUNITY): Payer: Self-pay

## 2022-09-28 ENCOUNTER — Other Ambulatory Visit: Payer: Self-pay

## 2022-09-28 ENCOUNTER — Observation Stay (HOSPITAL_COMMUNITY)
Admission: EM | Admit: 2022-09-28 | Discharge: 2022-10-02 | Disposition: A | Discharge status: Skilled Nursing Facility | Payer: Medicare PPO | Attending: Internal Medicine | Admitting: Internal Medicine

## 2022-09-28 ENCOUNTER — Telehealth: Payer: Self-pay | Admitting: Nurse Practitioner

## 2022-09-28 ENCOUNTER — Emergency Department (HOSPITAL_COMMUNITY): Payer: Medicare PPO

## 2022-09-28 DIAGNOSIS — Z96653 Presence of artificial knee joint, bilateral: Secondary | ICD-10-CM | POA: Diagnosis not present

## 2022-09-28 DIAGNOSIS — J22 Unspecified acute lower respiratory infection: Secondary | ICD-10-CM | POA: Diagnosis not present

## 2022-09-28 DIAGNOSIS — Z1152 Encounter for screening for COVID-19: Secondary | ICD-10-CM | POA: Diagnosis not present

## 2022-09-28 DIAGNOSIS — H9193 Unspecified hearing loss, bilateral: Secondary | ICD-10-CM | POA: Diagnosis present

## 2022-09-28 DIAGNOSIS — R062 Wheezing: Secondary | ICD-10-CM | POA: Diagnosis not present

## 2022-09-28 DIAGNOSIS — R600 Localized edema: Secondary | ICD-10-CM | POA: Insufficient documentation

## 2022-09-28 DIAGNOSIS — J9601 Acute respiratory failure with hypoxia: Secondary | ICD-10-CM | POA: Diagnosis not present

## 2022-09-28 DIAGNOSIS — R0602 Shortness of breath: Secondary | ICD-10-CM | POA: Insufficient documentation

## 2022-09-28 DIAGNOSIS — J441 Chronic obstructive pulmonary disease with (acute) exacerbation: Principal | ICD-10-CM | POA: Insufficient documentation

## 2022-09-28 DIAGNOSIS — E1165 Type 2 diabetes mellitus with hyperglycemia: Secondary | ICD-10-CM

## 2022-09-28 DIAGNOSIS — J210 Acute bronchiolitis due to respiratory syncytial virus: Secondary | ICD-10-CM | POA: Diagnosis not present

## 2022-09-28 DIAGNOSIS — K219 Gastro-esophageal reflux disease without esophagitis: Secondary | ICD-10-CM

## 2022-09-28 DIAGNOSIS — Z7984 Long term (current) use of oral hypoglycemic drugs: Secondary | ICD-10-CM | POA: Insufficient documentation

## 2022-09-28 DIAGNOSIS — Z7902 Long term (current) use of antithrombotics/antiplatelets: Secondary | ICD-10-CM | POA: Insufficient documentation

## 2022-09-28 DIAGNOSIS — J42 Unspecified chronic bronchitis: Secondary | ICD-10-CM | POA: Diagnosis present

## 2022-09-28 DIAGNOSIS — E1122 Type 2 diabetes mellitus with diabetic chronic kidney disease: Secondary | ICD-10-CM | POA: Diagnosis not present

## 2022-09-28 DIAGNOSIS — R410 Disorientation, unspecified: Secondary | ICD-10-CM | POA: Diagnosis not present

## 2022-09-28 DIAGNOSIS — Z7982 Long term (current) use of aspirin: Secondary | ICD-10-CM | POA: Diagnosis not present

## 2022-09-28 DIAGNOSIS — R059 Cough, unspecified: Secondary | ICD-10-CM | POA: Insufficient documentation

## 2022-09-28 DIAGNOSIS — I5032 Chronic diastolic (congestive) heart failure: Secondary | ICD-10-CM | POA: Diagnosis not present

## 2022-09-28 DIAGNOSIS — R5383 Other fatigue: Secondary | ICD-10-CM | POA: Diagnosis not present

## 2022-09-28 DIAGNOSIS — I130 Hypertensive heart and chronic kidney disease with heart failure and stage 1 through stage 4 chronic kidney disease, or unspecified chronic kidney disease: Secondary | ICD-10-CM | POA: Diagnosis not present

## 2022-09-28 DIAGNOSIS — Z79899 Other long term (current) drug therapy: Secondary | ICD-10-CM | POA: Insufficient documentation

## 2022-09-28 DIAGNOSIS — E871 Hypo-osmolality and hyponatremia: Secondary | ICD-10-CM | POA: Insufficient documentation

## 2022-09-28 DIAGNOSIS — D696 Thrombocytopenia, unspecified: Secondary | ICD-10-CM

## 2022-09-28 DIAGNOSIS — J9621 Acute and chronic respiratory failure with hypoxia: Secondary | ICD-10-CM | POA: Insufficient documentation

## 2022-09-28 DIAGNOSIS — R1110 Vomiting, unspecified: Secondary | ICD-10-CM | POA: Diagnosis not present

## 2022-09-28 DIAGNOSIS — E7800 Pure hypercholesterolemia, unspecified: Secondary | ICD-10-CM | POA: Diagnosis not present

## 2022-09-28 DIAGNOSIS — R7989 Other specified abnormal findings of blood chemistry: Secondary | ICD-10-CM | POA: Diagnosis present

## 2022-09-28 DIAGNOSIS — I10 Essential (primary) hypertension: Secondary | ICD-10-CM

## 2022-09-28 DIAGNOSIS — N1832 Chronic kidney disease, stage 3b: Secondary | ICD-10-CM | POA: Diagnosis not present

## 2022-09-28 DIAGNOSIS — E119 Type 2 diabetes mellitus without complications: Secondary | ICD-10-CM

## 2022-09-28 DIAGNOSIS — N183 Chronic kidney disease, stage 3 unspecified: Secondary | ICD-10-CM | POA: Diagnosis present

## 2022-09-28 DIAGNOSIS — I739 Peripheral vascular disease, unspecified: Secondary | ICD-10-CM

## 2022-09-28 LAB — MAGNESIUM: Magnesium: 1.8 mg/dL (ref 1.7–2.4)

## 2022-09-28 LAB — CBC WITH DIFFERENTIAL/PLATELET
Abs Immature Granulocytes: 0.03 10*3/uL (ref 0.00–0.07)
Basophils Absolute: 0 10*3/uL (ref 0.0–0.1)
Basophils Relative: 0 %
Eosinophils Absolute: 0.1 10*3/uL (ref 0.0–0.5)
Eosinophils Relative: 1 %
HCT: 38.8 % (ref 36.0–46.0)
Hemoglobin: 12.4 g/dL (ref 12.0–15.0)
Immature Granulocytes: 0 %
Lymphocytes Relative: 18 %
Lymphs Abs: 1.3 10*3/uL (ref 0.7–4.0)
MCH: 28.3 pg (ref 26.0–34.0)
MCHC: 32 g/dL (ref 30.0–36.0)
MCV: 88.6 fL (ref 80.0–100.0)
Monocytes Absolute: 0.6 10*3/uL (ref 0.1–1.0)
Monocytes Relative: 8 %
Neutro Abs: 5.4 10*3/uL (ref 1.7–7.7)
Neutrophils Relative %: 73 %
Platelets: 225 10*3/uL (ref 150–400)
RBC: 4.38 MIL/uL (ref 3.87–5.11)
RDW: 14.7 % (ref 11.5–15.5)
WBC: 7.5 10*3/uL (ref 4.0–10.5)
nRBC: 0 % (ref 0.0–0.2)

## 2022-09-28 LAB — BASIC METABOLIC PANEL
Anion gap: 10 (ref 5–15)
BUN: 8 mg/dL (ref 8–23)
CO2: 32 mmol/L (ref 22–32)
Calcium: 9.4 mg/dL (ref 8.9–10.3)
Chloride: 88 mmol/L — ABNORMAL LOW (ref 98–111)
Creatinine, Ser: 0.64 mg/dL (ref 0.44–1.00)
GFR, Estimated: >60 mL/min (ref >60)
Glucose, Bld: 287 mg/dL — ABNORMAL HIGH (ref 70–99)
Potassium: 4.2 mmol/L (ref 3.5–5.1)
Sodium: 130 mmol/L — ABNORMAL LOW (ref 135–145)

## 2022-09-28 LAB — RESP PANEL BY RT-PCR (RSV, FLU A&B, COVID)  RVPGX2
Influenza A by PCR: NEGATIVE
Influenza B by PCR: NEGATIVE
Resp Syncytial Virus by PCR: POSITIVE — AB
SARS Coronavirus 2 by RT PCR: NEGATIVE

## 2022-09-28 LAB — RESPIRATORY PANEL BY PCR

## 2022-09-28 LAB — GLUCOSE, CAPILLARY
Glucose-Capillary: 216 mg/dL — ABNORMAL HIGH (ref 70–99)
Glucose-Capillary: 204 mg/dL — ABNORMAL HIGH (ref 70–99)

## 2022-09-28 MED ORDER — AMLODIPINE BESYLATE 10 MG PO TABS
10.0000 mg | ORAL_TABLET | Freq: Every morning | ORAL | Status: DC
  Administered 2022-09-29 – 2022-10-02 (×4): 10 mg via ORAL
  Filled 2022-09-28 (×4): qty 1

## 2022-09-28 MED ORDER — IPRATROPIUM-ALBUTEROL 0.5-2.5 (3) MG/3ML IN SOLN: 3.0000 mL | RESPIRATORY_TRACT | Status: DC | PRN

## 2022-09-28 MED ORDER — FUROSEMIDE 20 MG PO TABS
20.0000 mg | ORAL_TABLET | Freq: Every day | ORAL | Status: DC
  Administered 2022-09-29 – 2022-10-02 (×4): 20 mg via ORAL
  Filled 2022-09-28 (×4): qty 1

## 2022-09-28 MED ORDER — IPRATROPIUM BROMIDE 0.02 % IN SOLN
0.5000 mg | Freq: Four times a day (QID) | RESPIRATORY_TRACT | Status: DC
  Administered 2022-09-28 – 2022-09-30 (×9): 0.5 mg via RESPIRATORY_TRACT
  Filled 2022-09-28 (×9): qty 2.5

## 2022-09-28 MED ORDER — ONDANSETRON HCL 4 MG PO TABS: 4.0000 mg | ORAL_TABLET | Freq: Three times a day (TID) | ORAL | Status: DC | PRN

## 2022-09-28 MED ORDER — ARTIFICIAL TEARS OPHTHALMIC OINT
TOPICAL_OINTMENT | Freq: Two times a day (BID) | OPHTHALMIC | Status: DC
  Administered 2022-09-29 – 2022-09-30 (×2): 1 via OPHTHALMIC
  Filled 2022-09-28: qty 3.5

## 2022-09-28 MED ORDER — ARTIFICIAL TEARS OPHTHALMIC OINT
TOPICAL_OINTMENT | Freq: Three times a day (TID) | OPHTHALMIC | Status: DC
  Filled 2022-09-28: qty 3.5

## 2022-09-28 MED ORDER — LOSARTAN POTASSIUM 50 MG PO TABS
50.0000 mg | ORAL_TABLET | Freq: Every day | ORAL | Status: DC
  Administered 2022-09-29 – 2022-10-02 (×4): 50 mg via ORAL
  Filled 2022-09-28 (×4): qty 1

## 2022-09-28 MED ORDER — INSULIN ASPART 100 UNIT/ML IJ SOLN
0.0000 [IU] | Freq: Three times a day (TID) | INTRAMUSCULAR | Status: DC
  Administered 2022-09-28: 5 [IU] via SUBCUTANEOUS
  Administered 2022-09-29 (×2): 2 [IU] via SUBCUTANEOUS
  Administered 2022-09-29: 5 [IU] via SUBCUTANEOUS
  Administered 2022-09-30: 8 [IU] via SUBCUTANEOUS
  Administered 2022-09-30: 2 [IU] via SUBCUTANEOUS
  Administered 2022-09-30: 5 [IU] via SUBCUTANEOUS

## 2022-09-28 MED ORDER — ACETAMINOPHEN 650 MG RE SUPP: 650.0000 mg | Freq: Four times a day (QID) | RECTAL | Status: DC | PRN

## 2022-09-28 MED ORDER — ONDANSETRON HCL 4 MG/2ML IJ SOLN
4.0000 mg | Freq: Once | INTRAMUSCULAR | Status: AC
  Administered 2022-10-01: 4 mg via INTRAVENOUS
  Filled 2022-09-28: qty 2

## 2022-09-28 MED ORDER — IPRATROPIUM-ALBUTEROL 0.5-2.5 (3) MG/3ML IN SOLN
3.0000 mL | Freq: Once | RESPIRATORY_TRACT | Status: AC
  Administered 2022-09-28: 3 mL via RESPIRATORY_TRACT
  Filled 2022-09-28: qty 3

## 2022-09-28 MED ORDER — PANTOPRAZOLE SODIUM 40 MG PO TBEC
40.0000 mg | DELAYED_RELEASE_TABLET | Freq: Two times a day (BID) | ORAL | Status: DC
  Administered 2022-09-28 – 2022-10-02 (×8): 40 mg via ORAL
  Filled 2022-09-28 (×9): qty 1

## 2022-09-28 MED ORDER — POLYETHYLENE GLYCOL 3350 17 G PO PACK
17.0000 g | PACK | Freq: Every day | ORAL | Status: DC | PRN
  Administered 2022-09-29 – 2022-10-02 (×3): 17 g via ORAL
  Filled 2022-09-28 (×3): qty 1

## 2022-09-28 MED ORDER — SODIUM CHLORIDE 0.9 % IV SOLN
500.0000 mg | Freq: Once | INTRAVENOUS | Status: AC
  Administered 2022-09-28: 500 mg via INTRAVENOUS
  Filled 2022-09-28: qty 5

## 2022-09-28 MED ORDER — PREDNISONE 20 MG PO TABS
40.0000 mg | ORAL_TABLET | Freq: Every day | ORAL | Status: DC
  Administered 2022-09-29 – 2022-09-30 (×2): 40 mg via ORAL
  Filled 2022-09-28 (×2): qty 2

## 2022-09-28 MED ORDER — PROPYLENE GLYCOL 0.6 % OP SOLN: 2.0000 [drp] | Freq: Two times a day (BID) | OPHTHALMIC | Status: DC

## 2022-09-28 MED ORDER — ASPIRIN 81 MG PO TBEC
162.0000 mg | DELAYED_RELEASE_TABLET | Freq: Every day | ORAL | Status: DC
  Administered 2022-09-28 – 2022-10-01 (×4): 162 mg via ORAL
  Filled 2022-09-28 (×4): qty 2

## 2022-09-28 MED ORDER — POLYETHYL GLYCOL-PROPYL GLYCOL 0.4-0.3 % OP SOLN: 2.0000 [drp] | Freq: Every day | OPHTHALMIC | Status: DC

## 2022-09-28 MED ORDER — SODIUM CHLORIDE 0.9 % IV SOLN
1.0000 g | Freq: Once | INTRAVENOUS | Status: AC
  Administered 2022-09-28: 1 g via INTRAVENOUS
  Filled 2022-09-28: qty 10

## 2022-09-28 MED ORDER — ENOXAPARIN SODIUM 40 MG/0.4ML IJ SOSY
40.0000 mg | PREFILLED_SYRINGE | INTRAMUSCULAR | Status: DC
  Administered 2022-09-28 – 2022-10-01 (×4): 40 mg via SUBCUTANEOUS
  Filled 2022-09-28 (×4): qty 0.4

## 2022-09-28 MED ORDER — DEXTROSE 5 % IV SOLN
250.0000 mg | INTRAVENOUS | Status: DC
  Administered 2022-09-29: 250 mg via INTRAVENOUS
  Filled 2022-09-28 (×3): qty 2.5

## 2022-09-28 MED ORDER — POLYVINYL ALCOHOL 1.4 % OP SOLN
1.0000 [drp] | Freq: Two times a day (BID) | OPHTHALMIC | Status: DC
  Filled 2022-09-28: qty 15

## 2022-09-28 MED ORDER — ALBUTEROL SULFATE (2.5 MG/3ML) 0.083% IN NEBU
2.5000 mg | INHALATION_SOLUTION | RESPIRATORY_TRACT | Status: DC | PRN
  Filled 2022-09-28 (×2): qty 3

## 2022-09-28 MED ORDER — ACETAMINOPHEN 325 MG PO TABS
650.0000 mg | ORAL_TABLET | Freq: Four times a day (QID) | ORAL | Status: DC | PRN
  Administered 2022-09-29: 650 mg via ORAL
  Filled 2022-09-28: qty 2

## 2022-09-28 MED ORDER — METHYLPREDNISOLONE SODIUM SUCC 125 MG IJ SOLR
125.0000 mg | Freq: Once | INTRAMUSCULAR | Status: AC
  Administered 2022-09-28: 125 mg via INTRAVENOUS
  Filled 2022-09-28: qty 2

## 2022-09-28 MED ORDER — SODIUM CHLORIDE 0.9% FLUSH
3.0000 mL | Freq: Two times a day (BID) | INTRAVENOUS | Status: DC
  Administered 2022-09-28 – 2022-10-02 (×9): 3 mL via INTRAVENOUS

## 2022-09-28 MED ORDER — VITAMIN B-12 1000 MCG PO TABS
1000.0000 ug | ORAL_TABLET | ORAL | Status: DC
  Administered 2022-10-01: 1000 ug via ORAL
  Filled 2022-09-28: qty 1

## 2022-09-28 NOTE — Telephone Encounter (Signed)
Called patient's daughter but she did not answer. Left message for her to call back.

## 2022-09-28 NOTE — ED Provider Notes (Signed)
[facility] Provider Note   CSN: [id number] Arrival date & time: 09/28/22  1059     History  No chief complaint on file.   Heidi Dominguez is a 89 y.o. female.  Heidi Dominguez is a 89 y.o. female with a history of hypertension, hyperlipidemia, CHF, diabetes, chronic bronchitis, hard of hearing, who presents to the emergency department accompanied by her daughter for evaluation of low sodium.  Patient was seen at the pulmonologist office yesterday for 2 months of ongoing cough and the sent off lab work and she was called today and told that her sodium was low and encouraged to come into the hospital for further evaluation.  Daughter is at bedside and helps to provide the majority of the history.  Patient has been having a worsening productive cough over the past 2 months with worsening wheezing.  She does not wear any oxygen at baseline.  At appointment yesterday she was given a breathing treatment with some improvement and then was supposed to be started on steroids and azithromycin but has not started these medications yet as [facility] had not gotten them in stock yet.  Patient reports persistent cough and wheezing and some worsening shortness of breath, no chest pain.  Daughter does report she has been more fatigued over the past 2 weeks and has intermittently seemed a bit more confused than usual.  Patient denies headaches or visual changes and has not noticed any focal or unilateral numbness or weakness.  No seizures.  No other aggravating relieving factors.  The history is provided by the patient, a relative and medical records.       Home Medications Prior to Admission medications   Medication Sig Start Date End Date Taking? Authorizing Provider  acetaminophen (TYLENOL) 650 MG CR tablet Take 1,300 mg by mouth every 8 (eight) hours as needed for pain.    [provider]  albuterol (VENTOLIN HFA) 108 (90 Base) MCG/ACT inhaler  Inhale 2 puffs into the lungs every 6 (six) hours as needed for wheezing or shortness of breath.    [provider]  amLODipine (NORVASC) 10 MG tablet Take 10 mg by mouth daily. 03/03/19   [provider]  aspirin EC 81 MG tablet Take 81 mg by mouth at bedtime.    [provider]  azithromycin (ZITHROMAX) 250 MG tablet Take 2 tabs on day one then 1 tab daily for four additional days 09/27/22   Cobb, Karie Schwalbe, NP  benzonatate (TESSALON) 200 MG capsule Take 200 mg by mouth 3 (three) times daily as needed.    [provider]  Carboxymethylcellulose Sod PF (REFRESH PLUS) 0.5 % SOLN Place 1 drop into both eyes daily as needed (eye injections).    [provider]  Cholecalciferol (VITAMIN D-3) 125 MCG (5000 UT) TABS Take 5,000 Units by mouth every Monday.    [provider]  clopidogrel (PLAVIX) 75 MG tablet Take 1 tablet (75 mg total) by mouth daily. Patient not taking: Reported on 09/27/2022 12/24/20   Dagoberto Ligas, PA-C  famotidine (PEPCID) 20 MG tablet Take one after  supper Patient not taking: Reported on 09/27/2022 11/06/19   Tanda Rockers, MD  ferrous sulfate 325 (65 FE) MG tablet Take 325 mg by mouth at bedtime. Patient not taking: Reported on 09/27/2022    [provider]  furosemide (LASIX) 20 MG tablet Take 20 mg by mouth daily.    [provider]  ipratropium-albuterol (DUONEB)  0.5-2.5 (3) MG/3ML SOLN Take 3 mLs by nebulization every 4 (four) hours as needed. 09/19/22   [provider]  JANUVIA 50 MG tablet Take 50 mg by mouth daily.  03/11/15   [provider]  levalbuterol Penne Lash) 1.25 MG/3ML nebulizer solution Take 1.25 mg by nebulization every 4 (four) hours as needed for shortness of breath or wheezing. 08/05/19   [provider]  lidocaine (XYLOCAINE) 5 % ointment APPLY TOPICALLY AS NEEDED. TOES FOR PAIN 06/29/20   Landis Martins, DPM  loratadine (CLARITIN) 10 MG tablet 1 tablet     [provider]  losartan (COZAAR) 25 MG tablet Take 25 mg by mouth daily. 10/28/20   [provider]  Multiple Vitamin (MULTIVITAMIN WITH MINERALS) TABS tablet Take 1 tablet by mouth daily. One a Day for 65 +    [provider]  ondansetron (ZOFRAN) 4 MG tablet Take 4 mg by mouth every 8 (eight) hours as needed for nausea or vomiting. 01/26/21   [provider]  ONETOUCH VERIO test strip USE TO CHECK BLOOD SUGAR ONCE DAILY. 11/19/19   [provider]  potassium chloride (K-DUR) 10 MEQ tablet Take 10 mEq by mouth daily. 03/03/19   [provider]  predniSONE (DELTASONE) 20 MG tablet Take 2 tablets (40 mg total) by mouth daily with breakfast for 5 days. 09/27/22 10/02/22  Cobb, Karie Schwalbe, NP  promethazine-dextromethorphan (PROMETHAZINE-DM) 6.25-15 MG/5ML syrup Take 5 mLs by mouth every 8 (eight) hours as needed for cough. 09/27/22   Cobb, Karie Schwalbe, NP  SYSTANE COMPLETE 0.6 % SOLN Place 2 drops into both eyes 2 (two) times daily. 11/10/19   [provider]  vitamin B-12 (CYANOCOBALAMIN) 1000 MCG tablet Take 1,000 mcg by mouth every Sunday.    [provider]      Allergies    Amoxicillin-pot clavulanate, Catapres [clonidine hcl], Codeine, Hydrocodone, Lisinopril, Other, Sulfa antibiotics, Sulfamethoxazole, Verapamil, and Sulfasalazine    Review of Systems   Review of Systems  Constitutional:  Positive for fatigue. Negative for chills and fever.  HENT: Negative.    Respiratory:  Positive for cough, shortness of breath and wheezing.   Cardiovascular:  Negative for chest pain.  Gastrointestinal:  Negative for abdominal pain, nausea and vomiting.  Genitourinary:  Negative for dysuria.  Musculoskeletal:  Negative for arthralgias and myalgias.  Skin:  Negative for color change and rash.  Neurological:  Positive for weakness (Generalized). Negative for dizziness, syncope and light-headedness.  All other systems reviewed and are  negative.   Physical Exam Updated Vital Signs BP 117/64   Pulse 94   Temp 98.4 F (36.9 C) (Oral)   Resp 20   Ht 5' 4"$  (1.626 m)   Wt 77.7 kg   SpO2 100%   BMI 29.40 kg/m  Physical Exam Vitals and nursing note reviewed.  Constitutional:      General: She is not in acute distress.    Appearance: Normal appearance. She is well-developed. She is ill-appearing. She is not diaphoretic.     Comments: Elderly female, very hard of hearing, somewhat ill-appearing with frequent coughing and audible wheezing  HENT:     Head: Normocephalic and atraumatic.     Nose: Nose normal.     Mouth/Throat:     Mouth: Mucous membranes are moist.     Pharynx: Oropharynx is clear.  Eyes:     General:        Right eye: No discharge.        Left eye:  No discharge.  Cardiovascular:     Rate and Rhythm: Normal rate and regular rhythm.     Pulses: Normal pulses.     Heart sounds: Normal heart sounds.  Pulmonary:     Effort: No respiratory distress.     Breath sounds: Wheezing present. No rales.     Comments: Patient with audible wheezing upon entering the room, frequent coughing, on 2 L nasal cannula and without oxygen sats drop into the 80s.  Patient with some increased respiratory effort but able to speak in short sentences.  On auscultation she has coarse wheezing in bilateral lung fields. Abdominal:     General: Bowel sounds are normal. There is no distension.     Palpations: Abdomen is soft. There is no mass.     Tenderness: There is no abdominal tenderness. There is no guarding.     Comments: Abdomen soft, nondistended, nontender to palpation in all quadrants without guarding or peritoneal signs  Musculoskeletal:        General: No deformity.     Cervical back: Neck supple.     Right lower leg: No edema.     Left lower leg: No edema.  Skin:    General: Skin is warm and dry.     Capillary Refill: Capillary refill takes less than 2 seconds.  Neurological:     Mental Status: She is alert and  oriented to person, place, and time.     Coordination: Coordination normal.     Comments: Speech is clear, able to follow commands CN III-XII intact Normal strength in upper and lower extremities bilaterally including dorsiflexion and plantar flexion, strong and equal grip strength Sensation normal to light and sharp touch Moves extremities without ataxia, coordination intact  Psychiatric:        Mood and Affect: Mood normal.        Behavior: Behavior normal.     ED Results / Procedures / Treatments   Labs (all labs ordered are listed, but only abnormal results are displayed) Labs Reviewed  BASIC METABOLIC PANEL - Abnormal; Notable for the following components:   Sodium 130 (*)    Chloride 88 (*)    Glucose, Bld 287 (*)    All other components within normal limits  CBC WITH DIFFERENTIAL/PLATELET  MAGNESIUM    EKG EKG Interpretation  Date/Time:  Thursday September 28 2022 11:04:00 EST Ventricular Rate:  102 PR Interval:  196 QRS Duration: 73 QT Interval:  310 QTC Calculation: 404 R Axis:   45 Text Interpretation: Sinus tachycardia LAE, consider biatrial enlargement No significant change since last tracing Confirmed by Deno Etienne [phone]) on 09/29/2022 11:51:33 AM  Radiology   DG Chest 2 View  Result Date: 09/27/2022 CLINICAL DATA:  Wheezing EXAM: CHEST - 2 VIEW COMPARISON:  CT chest dated 03/16/2021 FINDINGS: Lungs are clear.  No pleural effusion or pneumothorax. The heart is normal in size.  Thoracic aortic atherosclerosis. Large hiatal hernia. Mild degenerative changes of the thoracic spine. Bilateral shoulder arthroplasties. IMPRESSION: Normal chest radiographs. Large hiatal hernia. Electronically Signed   By: Julian Hy M.D.   On: 09/27/2022 12:11    Procedures Procedures    Medications Ordered in ED Medications  ipratropium-albuterol (DUONEB) 0.5-2.5 (3) MG/3ML nebulizer solution 3 mL (3 mLs Nebulization Given 09/28/22 1205)  cefTRIAXone (ROCEPHIN) 1 g in  sodium chloride 0.9 % 100 mL IVPB (0 g Intravenous Stopped 09/28/22 1335)  azithromycin (ZITHROMAX) 500 mg in sodium chloride 0.9 % 250 mL IVPB (0 mg Intravenous Stopped  09/28/22 1406)  methylPREDNISolone sodium succinate (SOLU-MEDROL) 125 mg/2 mL injection 125 mg (125 mg Intravenous Given 09/28/22 1300)  ondansetron (ZOFRAN) injection 4 mg (4 mg Intravenous Given 10/01/22 1817)  azithromycin (ZITHROMAX) tablet 250 mg (250 mg Oral Given 10/02/22 0920)    ED Course/ Medical Decision Making/ A&P                             Medical Decision Making Amount and/or Complexity of Data Reviewed Labs: ordered. Radiology: ordered.  Risk Prescription drug management. Decision regarding hospitalization.   89 y.o. female presents to the ED with complaints of low sodium on lab work from pulmonology visit yesterday, as well as cough, wheezing and shortness of breath, this involves an extensive number of treatment options, and is a complaint that carries with it a high risk of complications and morbidity.  Bronchitis, COPD, pneumonia, respiratory failure, as well as hyponatremia which could be related to medications, mycoplasma pneumonia, or dietary changes.  On arrival pt is nontoxic, vitals significant for hypoxia on room air, placed on 2 L nasal cannula with improvement, mildly hypertensive but vitals otherwise stable. Exam significant for wheezing audible upon entering room with frequent coughing and increased respiratory effort  Additional history obtained from daughter at bedside. Previous records obtained and reviewed including labs and notes from pulmonology visit yesterday  I ordered medication including DuoNeb, IV Rocephin and azithromycin and Solu-Medrol  Lab Tests:  I Ordered, reviewed, and interpreted labs, which included: No leukocytosis and normal hemoglobin, sodium of 138 and after correction for hyperglycemia with glucose of 287 this is 133.  This is an improvement from glucose in the office  yesterday of 127 although patient was noted to be hyperglycemic at that time as well and with correction NA of 129.  Normal magnesium.  Imaging Studies ordered:  I ordered imaging studies which included chest x-ray, I independently visualized and interpreted imaging which showed no evidence of pneumonia, large hiatal hernia noted.  ED Course:   Fortunately patient's sodium is improved today I suspect elevated blood sugars are contributing and patient is also on multiple medications that could lead to some degree of hyponatremia.  Patient has had baseline mild hyponatremia on prior labs.  Patient's primary issue seems to be worsening respiratory status now with 2 L oxygen requirement, hypoxic into the 80s on room air.  Patient was audible diffuse wheezing and frequent cough.  While she does not have a diagnosed history of COPD and was sent out smoking history given patient's frequent issues with bronchitis and her age I do suspect there is some degree of underlying COPD currently in acute exacerbation.  Respiratory viral panel is pending.  Will plan for IV antibiotics, steroids and breathing treatments and hospital admission.  Case discussed with Dr. Pearson Grippe who will see and admit the patient.  Portions of this note were generated with [employer]. Dictation errors may occur despite best attempts at proofreading.         Final Clinical Impression(s) / ED Diagnoses Final diagnoses:  COPD exacerbation [facility])    Rx / DC Orders ED Discharge Orders     None         Janet Berlin 10/05/22 [id number]    Davonna Belling, MD 10/05/22 [phone]

## 2022-09-28 NOTE — Assessment & Plan Note (Signed)
BLE edema on exam today. See above plan.

## 2022-09-28 NOTE — ED Notes (Signed)
ED TO INPATIENT HANDOFF REPORT  ED Nurse Name and Phone #:   S Name/Age/Gender [name] 89 y.o. female Room/Bed: 011C/011C  Code Status   Code Status: Full Code  Home/SNF/Other Nursing Home Patient oriented to: self, place, time, and situation Is this baseline? Yes   Triage Complete: Triage complete  Chief Complaint COPD exacerbation ([place]) [J44.1]  Triage Note Pt arrived via [facility] from [facility] due to sodium of 127. Pt c/o coughx2 wks. Pt has an audible wheezes. Pt is A&Ox4.    Allergies Allergies  Allergen Reactions   Amoxicillin-Pot Clavulanate Diarrhea   Catapres [Clonidine Hcl] Other (See Comments)    Unknown.    Codeine Other (See Comments)    "Makes me out of my head, loopy"   Hydrocodone Other (See Comments)    "makes me go out of my head, loopy"   Lisinopril Cough   Other Other (See Comments)    Any type of narcotics Feels "loopy"    Sulfa Antibiotics Itching and Other (See Comments)   Sulfamethoxazole Diarrhea   Verapamil Diarrhea and Other (See Comments)   Sulfasalazine Itching    Level of Care/Admitting Diagnosis ED Disposition     ED Disposition  Admit   Condition  --   [place]: [place] [[id number]]  Level of Care: Telemetry Medical [104]  May place patient in observation at [facility] or [place] if equivalent level of care is available:: No  Covid Evaluation: Asymptomatic - no recent exposure (last 10 days) testing not required  Diagnosis: COPD exacerbation [facility]) [id number]  Admitting Physician: Marcelyn Bruins [id number]  Attending Physician: Marcelyn Bruins [id number]          B Medical/Surgery History Past Medical History:  Diagnosis Date   Acute pancreatitis    hx of    Arthritis    "qwhere" (07/04/2018)   Chronic back pain    "all over" (07/04/2018)   Chronic diastolic CHF (congestive heart failure) ([place])    Chronic kidney disease    stage  3 chronic kidney  disease    Complication of anesthesia    "I have a hard time waking up"   Degenerative joint disease of shoulder region    Dry eye syndrome    Dyspnea    Family history of adverse reaction to anesthesia    "daughter has the shakes when she wakes up"   GERD (gastroesophageal reflux disease)    Heart murmur    High cholesterol    History of hiatal hernia    HOH (hard of hearing)    Hypertension    Impacted cerumen of both ears    hx of    Muscle weakness (generalized)    Osteoarthritis    Overactive bladder    Pancreatitis    hx of    Phlebitis    "BLE"   Rotator cuff tear    right    Tear of right supraspinatus tendon    Thrombocytopenia (HCC)    hx of    Type II diabetes mellitus ([place])    Urinary tract infection    hx of    UTI (urinary tract infection) 09/12/2018   Xerosis cutis    hx of    Past Surgical History:  Procedure Laterality Date   ABDOMINAL AORTOGRAM W/LOWER EXTREMITY N/A 12/22/2020   Procedure: ABDOMINAL AORTOGRAM W/LOWER EXTREMITY;  Surgeon: Cherre Robins, MD;  Location: [place] [facility];  Service: Cardiovascular;  Laterality:  N/A;   ABDOMINAL HYSTERECTOMY     BACK SURGERY     CATARACT EXTRACTION W/ INTRAOCULAR LENS  IMPLANT, BILATERAL Bilateral    CHOLECYSTECTOMY  2016   ERCP N/A 08/30/2018   Procedure: ENDOSCOPIC RETROGRADE CHOLANGIOPANCREATOGRAPHY (ERCP);  Surgeon: Clarene Essex, MD;  Location: [facility];  Service: Endoscopy;  Laterality: N/A;   FIXATION KYPHOPLASTY     INCISION AND DRAINAGE Right 11/08/2018   Procedure: INCISION AND DRAINAGE right shoulder, placement of antibiotic beads ;  Surgeon: [name], MD;  Location: [facility];  Service: Orthopedics;  Laterality: Right;   JOINT REPLACEMENT     LAPAROSCOPIC CHOLECYSTECTOMY SINGLE SITE WITH INTRAOPERATIVE CHOLANGIOGRAM N/A 04/11/2015   Procedure: LAPAROSCOPIC LYSIS OF ADHESIONS, LAPAROSCOPIC CHOLECYSTECTOMY WITH INTRAOPERATIVE CHOLANGIOGRAM;  Surgeon: Michael Boston, MD;  Location: [facility];   Service: General;  Laterality: N/A;   PANCREATIC STENT PLACEMENT  08/30/2018   Procedure: PANCREATIC STENT PLACEMENT;  Surgeon: Clarene Essex, MD;  Location: [facility];  Service: Endoscopy;;   PERIPHERAL VASCULAR INTERVENTION  12/22/2020   Procedure: PERIPHERAL VASCULAR INTERVENTION;  Surgeon: Cherre Robins, MD;  Location: [place] [facility];  Service: Cardiovascular;;  left anterior tibial artery   REMOVAL OF STONES  08/30/2018   Procedure: REMOVAL OF STONES;  Surgeon: Clarene Essex, MD;  Location: [facility];  Service: Endoscopy;;   SHOULDER OPEN ROTATOR CUFF REPAIR Bilateral    SPHINCTEROTOMY  08/30/2018   Procedure: SPHINCTEROTOMY;  Surgeon: Clarene Essex, MD;  Location: [facility];  Service: Endoscopy;;   TOTAL KNEE ARTHROPLASTY Bilateral      A IV Location/Drains/Wounds Patient Lines/Drains/Airways Status     Active Line/Drains/Airways     Name Placement date Placement time Site Days   Peripheral IV 03/16/21 22 G Anterior;Right Forearm 03/16/21  1824  Forearm  561   Peripheral IV 09/28/22 20 G Anterior;Left Forearm 09/28/22  1110  Forearm  less than 1   External Urinary Catheter 03/15/21  2059  --  562   GI Stent 5 Fr. 08/30/18  0855  --  1490   Incision (Closed) 11/08/18 Shoulder Right 11/08/18  0852  -- 1420   Wound / Incision (Open or Dehisced) 12/22/20 Toe (Comment  which one) Left pink 12/22/20  2100  Toe (Comment  which one)  645   Wound / Incision (Open or Dehisced) 03/15/21 Sacrum 03/15/21  2000  Sacrum  562            Intake/Output Last 24 hours No intake or output data in the 24 hours ending 09/28/22 1406  Labs/Imaging Results for orders placed or performed during the hospital encounter of 09/28/22 (from the past 48 hour(s))  Basic metabolic panel     Status: Abnormal   Collection Time: 09/28/22 11:06 AM  Result Value Ref Range   Sodium 130 (L) 135 - 145 mmol/L   Potassium 4.2 3.5 - 5.1 mmol/L   Chloride 88 (L) 98 - 111 mmol/L   CO2 32 22 - 32 mmol/L    Glucose, Bld 287 (H) 70 - 99 mg/dL    Comment: Glucose reference range applies only to samples taken after fasting for at least 8 hours.   BUN 8 8 - 23 mg/dL   Creatinine, Ser 0.64 0.44 - 1.00 mg/dL   Calcium 9.4 8.9 - 10.3 mg/dL   GFR, Estimated >60 >60 mL/min    Comment: (NOTE) Calculated using the CKD-EPI Creatinine Equation (2021)    Anion gap 10 5 - 15    Comment: Performed at [facility]  Lab, [address], [place], [place] [address]  CBC with Differential     Status: None   Collection Time: 09/28/22 11:06 AM  Result Value Ref Range   WBC 7.5 4.0 - 10.5 K/uL   RBC 4.38 3.87 - 5.11 MIL/uL   Hemoglobin 12.4 12.0 - 15.0 g/dL   HCT 38.8 36.0 - 46.0 %   MCV 88.6 80.0 - 100.0 fL   MCH 28.3 26.0 - 34.0 pg   MCHC 32.0 30.0 - 36.0 g/dL   RDW 14.7 11.5 - 15.5 %   Platelets 225 150 - 400 K/uL   nRBC 0.0 0.0 - 0.2 %   Neutrophils Relative % 73 %   Neutro Abs 5.4 1.7 - 7.7 K/uL   Lymphocytes Relative 18 %   Lymphs Abs 1.3 0.7 - 4.0 K/uL   Monocytes Relative 8 %   Monocytes Absolute 0.6 0.1 - 1.0 K/uL   Eosinophils Relative 1 %   Eosinophils Absolute 0.1 0.0 - 0.5 K/uL   Basophils Relative 0 %   Basophils Absolute 0.0 0.0 - 0.1 K/uL   Immature Granulocytes 0 %   Abs Immature Granulocytes 0.03 0.00 - 0.07 K/uL    Comment: Performed at [facility], [address], [place], [place] [address]  Magnesium     Status: None   Collection Time: 09/28/22 11:06 AM  Result Value Ref Range   Magnesium 1.8 1.7 - 2.4 mg/dL    Comment: Performed at [facility] [address], [place], [place] [address]   DG Chest Port 1 View  Result Date: 09/28/2022 CLINICAL DATA:  Cough, wheezing EXAM: PORTABLE CHEST 1 VIEW COMPARISON:  09/27/2022 FINDINGS: Cardiomegaly. Large hiatal hernia. Both lungs are clear. Status post bilateral shoulder arthroplasty. IMPRESSION: 1. Cardiomegaly without acute abnormality of the lungs. 2. Large hiatal hernia. Electronically Signed   By: Delanna Ahmadi M.D.    On: 09/28/2022 11:33   DG Chest 2 View  Result Date: 09/27/2022 CLINICAL DATA:  Wheezing EXAM: CHEST - 2 VIEW COMPARISON:  CT chest dated 03/16/2021 FINDINGS: Lungs are clear.  No pleural effusion or pneumothorax. The heart is normal in size.  Thoracic aortic atherosclerosis. Large hiatal hernia. Mild degenerative changes of the thoracic spine. Bilateral shoulder arthroplasties. IMPRESSION: Normal chest radiographs. Large hiatal hernia. Electronically Signed   By: Julian Hy M.D.   On: 09/27/2022 12:11    Pending Labs Unresulted Labs (From admission, onward)     Start     Ordered   10/05/22 0500  Creatinine, serum  (enoxaparin (LOVENOX)    CrCl >/= 30 ml/min)  Weekly,   R     Comments: while on enoxaparin therapy    09/28/22 1328   09/29/22 0500  Comprehensive metabolic panel  Tomorrow morning,   R        09/28/22 1328   09/29/22 0500  CBC  Tomorrow morning,   R        09/28/22 1328   09/28/22 1321  Respiratory (~20 pathogens) panel by PCR  (COPD / Pneumonia / Cellulitis / Lower Extremity Wound)  Add-on,   AD        09/28/22 1328   09/28/22 1320  Resp panel by RT-PCR (RSV, Flu A&B, Covid) Anterior Nasal Swab  (COPD / Pneumonia / Cellulitis / Lower Extremity Wound)  Once,   R        09/28/22 1328            Vitals/Pain Today's Vitals  09/28/22 1200 09/28/22 1215 09/28/22 1230 09/28/22 1245  BP: (!) 113/46 92/83 (!) 125/51 130/60  Pulse: 91 89 90 93  Resp: (!) 22 (!) 27 (!) 26 (!) 23  Temp:      TempSrc:      SpO2: (!) 85% 99% 100% 98%  Weight:      Height:      PainSc:        Isolation Precautions Droplet precaution  Medications Medications  cyanocobalamin (VITAMIN B12) tablet 1,000 mcg (has no administration in time range)  enoxaparin (LOVENOX) injection 40 mg (has no administration in time range)  predniSONE (DELTASONE) tablet 40 mg (has no administration in time range)  ipratropium (ATROVENT) nebulizer solution 0.5 mg (has no administration in time  range)  albuterol (PROVENTIL) (2.5 MG/3ML) 0.083% nebulizer solution 2.5 mg (has no administration in time range)  sodium chloride flush (NS) 0.9 % injection 3 mL (3 mLs Intravenous Given 09/28/22 1348)  azithromycin (ZITHROMAX) 250 mg in dextrose 5 % 125 mL IVPB (has no administration in time range)  acetaminophen (TYLENOL) tablet 650 mg (has no administration in time range)    Or  acetaminophen (TYLENOL) suppository 650 mg (has no administration in time range)  polyethylene glycol (MIRALAX / GLYCOLAX) packet 17 g (has no administration in time range)  pantoprazole (PROTONIX) EC tablet 40 mg (has no administration in time range)  polyvinyl alcohol (LIQUIFILM TEARS) 1.4 % ophthalmic solution 1 drop (has no administration in time range)  ondansetron (ZOFRAN) injection 4 mg (has no administration in time range)  ipratropium-albuterol (DUONEB) 0.5-2.5 (3) MG/3ML nebulizer solution 3 mL (3 mLs Nebulization Given 09/28/22 1205)  cefTRIAXone (ROCEPHIN) 1 g in sodium chloride 0.9 % 100 mL IVPB (0 g Intravenous Stopped 09/28/22 1335)  azithromycin (ZITHROMAX) 500 mg in sodium chloride 0.9 % 250 mL IVPB (0 mg Intravenous Stopped 09/28/22 1406)  methylPREDNISolone sodium succinate (SOLU-MEDROL) 125 mg/2 mL injection 125 mg (125 mg Intravenous Given 09/28/22 1300)    Mobility non-ambulatory     Focused Assessments Neuro Assessment Handoff:  Swallow screen pass?          Neuro Assessment:   Neuro Checks:      Has TPA been given? No If patient is a Neuro Trauma and patient is going to OR before floor call report to [facility] nurse: [phone] or [phone]   R Recommendations: See Admitting Provider Note  Report given to:   Additional Notes:

## 2022-09-28 NOTE — ED Triage Notes (Signed)
Pt arrived via [facility] from [facility] due to sodium of 127. Pt c/o coughx2 wks. Pt has an audible wheezes. Pt is A&Ox4.

## 2022-09-28 NOTE — Progress Notes (Signed)
Holly, please call the patient or her daughter and let her know that her sodium levels were low which can cause multiple health problems (not her cough). This is concerning and with her age/frailty, needs to be rechecked/treated in the inpatient setting. She needs to go to the ED for further evaluation/management. Thanks.

## 2022-09-28 NOTE — H&P (Signed)
History and Physical   Heidi Dominguez [id number] DOB: April 28, 1921 DOA: 09/28/2022  PCP: Lajean Manes, MD   Patient coming from: [facility]  Chief Complaint: Abnormal lab  HPI: Heidi Dominguez is a 89 y.o. female with medical history significant of chronic bronchitis, hyperlipidemia, PAD, ischemic colitis, GERD, hypertension, thrombocytopenia, hearing loss, diabetes, CKD 3, diastolic CHF, recurrent pancreatitis presenting with abnormal labs.  Patient recently has been experiencing increased cough for the past couple weeks with increased wheezing.  Also intermittent sputum production with some mild shortness of breath.  Has had a trial of doxycycline.  Was seen by pulmonology for this yesterday.  Due to concern for early COPD/chronic bronchitis exacerbation patient was prescribed steroids, azithromycin and labs were checked.  Lab workup came back with sodium of 127 with a glucose of 254 which corrects to the 130s.  Patient sent to the ED for further evaluation of this low sodium.  On arrival repeat sodium was 130 and considering her elevated glucose is corrected to 130-134.  However, patient noted to have significant diffuse wheezing and intermittent hypoxia/desaturations to 85% on room air.  She had not yet had a chance to pick up her azithromycin or steroid taper.  Given hypoxia and COPD exacerbation, admission was requested.  Patient denies fever, chills, chest pain, abdominal pain, constipation, diarrhea, nausea, vomiting.  ED Course: Vital signs in ED significant for blood pressure in the 0000000 to [id number] systolic, heart rate in the 90s, respiratory rate in the 20s saturating in the low 90s on room air most of the time with some intermittent desaturations into the mid 80s.  Lab workup included BMP with sodium of 130 which corrects to 133-134 considering glucose of 287, chloride 88.  CBC within normal limits.  Magnesium normal.  Patient received ceftriaxone, azithromycin,  Solu-Medrol, DuoNebs in the ED.  Chest x-ray showed cardiomegaly but no acute pulmonary abnormality.  Review of Systems: As per HPI otherwise all other systems reviewed and are negative.  Past Medical History:  Diagnosis Date   Acute pancreatitis    hx of    Arthritis    "qwhere" (07/04/2018)   Chronic back pain    "all over" (07/04/2018)   Chronic diastolic CHF (congestive heart failure) (HCC)    Chronic kidney disease    stage  3 chronic kidney disease    Complication of anesthesia    "I have a hard time waking up"   Degenerative joint disease of shoulder region    Dry eye syndrome    Dyspnea    Family history of adverse reaction to anesthesia    "daughter has the shakes when she wakes up"   GERD (gastroesophageal reflux disease)    Heart murmur    High cholesterol    History of hiatal hernia    HOH (hard of hearing)    Hypertension    Impacted cerumen of both ears    hx of    Muscle weakness (generalized)    Osteoarthritis    Overactive bladder    Pancreatitis    hx of    Phlebitis    "BLE"   Rotator cuff tear    right    Tear of right supraspinatus tendon    Thrombocytopenia (HCC)    hx of    Type II diabetes mellitus ([place])    Urinary tract infection    hx of    UTI (urinary tract infection) 09/12/2018   Xerosis cutis    hx of  Past Surgical History:  Procedure Laterality Date   ABDOMINAL AORTOGRAM W/LOWER EXTREMITY N/A 12/22/2020   Procedure: ABDOMINAL AORTOGRAM W/LOWER EXTREMITY;  Surgeon: Cherre Robins, MD;  Location: [place] [facility];  Service: Cardiovascular;  Laterality: N/A;   ABDOMINAL HYSTERECTOMY     BACK SURGERY     CATARACT EXTRACTION W/ INTRAOCULAR LENS  IMPLANT, BILATERAL Bilateral    CHOLECYSTECTOMY  2016   ERCP N/A 08/30/2018   Procedure: ENDOSCOPIC RETROGRADE CHOLANGIOPANCREATOGRAPHY (ERCP);  Surgeon: Clarene Essex, MD;  Location: [facility];  Service: Endoscopy;  Laterality: N/A;   FIXATION KYPHOPLASTY     INCISION AND DRAINAGE  Right 11/08/2018   Procedure: INCISION AND DRAINAGE right shoulder, placement of antibiotic beads ;  Surgeon: [name], MD;  Location: [facility];  Service: Orthopedics;  Laterality: Right;   JOINT REPLACEMENT     LAPAROSCOPIC CHOLECYSTECTOMY SINGLE SITE WITH INTRAOPERATIVE CHOLANGIOGRAM N/A 04/11/2015   Procedure: LAPAROSCOPIC LYSIS OF ADHESIONS, LAPAROSCOPIC CHOLECYSTECTOMY WITH INTRAOPERATIVE CHOLANGIOGRAM;  Surgeon: Michael Boston, MD;  Location: [facility];  Service: General;  Laterality: N/A;   PANCREATIC STENT PLACEMENT  08/30/2018   Procedure: PANCREATIC STENT PLACEMENT;  Surgeon: Clarene Essex, MD;  Location: [facility];  Service: Endoscopy;;   PERIPHERAL VASCULAR INTERVENTION  12/22/2020   Procedure: PERIPHERAL VASCULAR INTERVENTION;  Surgeon: Cherre Robins, MD;  Location: [place] [facility];  Service: Cardiovascular;;  left anterior tibial artery   REMOVAL OF STONES  08/30/2018   Procedure: REMOVAL OF STONES;  Surgeon: Clarene Essex, MD;  Location: [facility];  Service: Endoscopy;;   SHOULDER OPEN ROTATOR CUFF REPAIR Bilateral    SPHINCTEROTOMY  08/30/2018   Procedure: SPHINCTEROTOMY;  Surgeon: Clarene Essex, MD;  Location: [facility];  Service: Endoscopy;;   TOTAL KNEE ARTHROPLASTY Bilateral     Social History  reports that she has never smoked. She has never used smokeless tobacco. She reports that she does not drink alcohol and does not use drugs.  Allergies  Allergen Reactions   Amoxicillin-Pot Clavulanate Diarrhea   Catapres [Clonidine Hcl] Other (See Comments)    Unknown.    Codeine Other (See Comments)    "Makes me out of my head, loopy"   Hydrocodone Other (See Comments)    "makes me go out of my head, loopy"   Lisinopril Cough   Other Other (See Comments)    Any type of narcotics Feels "loopy"    Sulfa Antibiotics Itching and Other (See Comments)   Sulfamethoxazole Diarrhea   Verapamil Diarrhea and Other (See Comments)   Sulfasalazine Itching    Family History   Problem Relation Age of Onset   Hypertension Mother    Diabetes Mellitus I Mother    Hypertension Father   Reviewed on admission  Prior to Admission medications   Medication Sig Start Date End Date Taking? Authorizing Provider  acetaminophen (TYLENOL) 650 MG CR tablet Take 1,300 mg by mouth every 8 (eight) hours as needed for pain.    [provider]  albuterol (VENTOLIN HFA) 108 (90 Base) MCG/ACT inhaler Inhale 2 puffs into the lungs every 6 (six) hours as needed for wheezing or shortness of breath.    [provider]  amLODipine (NORVASC) 10 MG tablet Take 10 mg by mouth daily. 03/03/19   [provider]  aspirin EC 81 MG tablet Take 81 mg by mouth at bedtime.    [provider]  azithromycin (ZITHROMAX) 250 MG tablet Take 2 tabs on day one then 1 tab daily for four additional days 09/27/22  Cobb, Karie Schwalbe, NP  benzonatate (TESSALON) 200 MG capsule Take 200 mg by mouth 3 (three) times daily as needed.    [provider]  Carboxymethylcellulose Sod PF (REFRESH PLUS) 0.5 % SOLN Place 1 drop into both eyes daily as needed (eye injections).    [provider]  Cholecalciferol (VITAMIN D-3) 125 MCG (5000 UT) TABS Take 5,000 Units by mouth every Monday.    [provider]  clopidogrel (PLAVIX) 75 MG tablet Take 1 tablet (75 mg total) by mouth daily. Patient not taking: Reported on 09/27/2022 12/24/20   Dagoberto Ligas, PA-C  famotidine (PEPCID) 20 MG tablet Take one after  supper Patient not taking: Reported on 09/27/2022 11/06/19   Tanda Rockers, MD  ferrous sulfate 325 (65 FE) MG tablet Take 325 mg by mouth at bedtime. Patient not taking: Reported on 09/27/2022    [provider]  furosemide (LASIX) 20 MG tablet Take 20 mg by mouth daily.    [provider]  ipratropium-albuterol (DUONEB) 0.5-2.5 (3) MG/3ML SOLN Take 3 mLs by nebulization every 4 (four) hours as needed. 09/19/22   [provider]  JANUVIA  50 MG tablet Take 50 mg by mouth daily.  03/11/15   [provider]  levalbuterol Penne Lash) 1.25 MG/3ML nebulizer solution Take 1.25 mg by nebulization every 4 (four) hours as needed for shortness of breath or wheezing. 08/05/19   [provider]  lidocaine (XYLOCAINE) 5 % ointment APPLY TOPICALLY AS NEEDED. TOES FOR PAIN 06/29/20   Landis Martins, DPM  loratadine (CLARITIN) 10 MG tablet 1 tablet    [provider]  losartan (COZAAR) 25 MG tablet Take 25 mg by mouth daily. 10/28/20   [provider]  Multiple Vitamin (MULTIVITAMIN WITH MINERALS) TABS tablet Take 1 tablet by mouth daily. One a Day for 65 +    [provider]  ondansetron (ZOFRAN) 4 MG tablet Take 4 mg by mouth every 8 (eight) hours as needed for nausea or vomiting. 01/26/21   [provider]  ONETOUCH VERIO test strip USE TO CHECK BLOOD SUGAR ONCE DAILY. 11/19/19   [provider]  potassium chloride (K-DUR) 10 MEQ tablet Take 10 mEq by mouth daily. 03/03/19   [provider]  predniSONE (DELTASONE) 20 MG tablet Take 2 tablets (40 mg total) by mouth daily with breakfast for 5 days. 09/27/22 10/02/22  Cobb, Karie Schwalbe, NP  promethazine-dextromethorphan (PROMETHAZINE-DM) 6.25-15 MG/5ML syrup Take 5 mLs by mouth every 8 (eight) hours as needed for cough. 09/27/22   Cobb, Karie Schwalbe, NP  SYSTANE COMPLETE 0.6 % SOLN Place 2 drops into both eyes 2 (two) times daily. 11/10/19   [provider]  vitamin B-12 (CYANOCOBALAMIN) 1000 MCG tablet Take 1,000 mcg by mouth every Sunday.    [provider]    Physical Exam: Vitals:   09/28/22 1200 09/28/22 1215 09/28/22 1230 09/28/22 1245  BP: (!) 113/46 92/83 (!) 125/51 130/60  Pulse: 91 89 90 93  Resp: (!) 22 (!) 27 (!) 26 (!) 23  Temp:      TempSrc:      SpO2: (!) 85% 99% 100% 98%  Weight:      Height:        Physical Exam Constitutional:      General: She is not in acute distress.    Appearance: Normal  appearance.  HENT:     Head: Normocephalic and atraumatic.     Mouth/Throat:     Mouth: Mucous membranes are moist.  Pharynx: Oropharynx is clear.  Eyes:     Extraocular Movements: Extraocular movements intact.     Pupils: Pupils are equal, round, and reactive to light.  Cardiovascular:     Rate and Rhythm: Normal rate and regular rhythm.     Pulses: Normal pulses.     Heart sounds: Normal heart sounds.  Pulmonary:     Effort: Pulmonary effort is normal. No respiratory distress.     Breath sounds: Wheezing and rhonchi present.  Abdominal:     General: Bowel sounds are normal. There is no distension.     Palpations: Abdomen is soft.     Tenderness: There is no abdominal tenderness.  Musculoskeletal:        General: No swelling or deformity.  Skin:    General: Skin is warm and dry.  Neurological:     General: No focal deficit present.     Mental Status: Mental status is at baseline.    Labs on Admission: I have personally reviewed following labs and imaging studies  CBC: Recent Labs  Lab 09/27/22 1218 09/28/22 1106  WBC 7.6 7.5  NEUTROABS 5.8 5.4  HGB 12.2 12.4  HCT 38.3 38.8  MCV 87.0 88.6  PLT 240.0 123456    Basic Metabolic Panel: Recent Labs  Lab 09/27/22 1218 09/28/22 1106  NA 127* 130*  K 4.0 4.2  CL 85* 88*  CO2 37* 32  GLUCOSE 254* 287*  BUN 10 8  CREATININE 0.53 0.64  CALCIUM 9.9 9.4  MG  --  1.8    GFR: Estimated Creatinine Clearance: 38.7 mL/min (by C-G formula based on SCr of 0.64 mg/dL).  Liver Function Tests: No results for input(s): "AST", "ALT", "ALKPHOS", "BILITOT", "PROT", "ALBUMIN" in the last 168 hours.  Urine analysis:    Component Value Date/Time   COLORURINE YELLOW 11/02/2019 2013   APPEARANCEUR HAZY (A) 11/02/2019 2013   LABSPEC 1.015 11/02/2019 2013   PHURINE 5.0 11/02/2019 2013   GLUCOSEU NEGATIVE 11/02/2019 2013   HGBUR NEGATIVE 11/02/2019 2013   [place] NEGATIVE 11/02/2019 2013   [place] 11/02/2019  2013   PROTEINUR NEGATIVE 11/02/2019 2013   UROBILINOGEN 1.0 04/07/2015 2312   NITRITE NEGATIVE 11/02/2019 2013   LEUKOCYTESUR LARGE (A) 11/02/2019 2013    Radiological Exams on Admission: DG Chest Port 1 View  Result Date: 09/28/2022 CLINICAL DATA:  Cough, wheezing EXAM: PORTABLE CHEST 1 VIEW COMPARISON:  09/27/2022 FINDINGS: Cardiomegaly. Large hiatal hernia. Both lungs are clear. Status post bilateral shoulder arthroplasty. IMPRESSION: 1. Cardiomegaly without acute abnormality of the lungs. 2. Large hiatal hernia. Electronically Signed   By: Delanna Ahmadi M.D.   On: 09/28/2022 11:33   DG Chest 2 View  Result Date: 09/27/2022 CLINICAL DATA:  Wheezing EXAM: CHEST - 2 VIEW COMPARISON:  CT chest dated 03/16/2021 FINDINGS: Lungs are clear.  No pleural effusion or pneumothorax. The heart is normal in size.  Thoracic aortic atherosclerosis. Large hiatal hernia. Mild degenerative changes of the thoracic spine. Bilateral shoulder arthroplasties. IMPRESSION: Normal chest radiographs. Large hiatal hernia. Electronically Signed   By: Julian Hy M.D.   On: 09/27/2022 12:11    EKG: Independently reviewed.  Sinus tachycardia at 102 bpm.  Some baseline wander.  QTc 4 4.  Assessment/Plan Principal Problem:   COPD exacerbation (HCC) Active Problems:   Essential hypertension   Diabetes mellitus type 2, controlled (HCC)   Chronic diastolic CHF (congestive heart failure) (HCC)   Bilateral hearing loss   CKD (chronic kidney disease) stage 3, GFR 30-59  ml/min (HCC)   Unspecified chronic bronchitis (HCC)   Gastroesophageal reflux disease   Pure hypercholesterolemia   Thrombocytopenia (HCC)   Peripheral artery disease (HCC)   Acute respiratory failure with hypoxia (HCC)   COPD exacerbation Acute respiratory failure with hypoxia > Patient initially presented due to abnormal lab but in the setting of recent increased cough and wheezing since, patient saw pulm yesterday who started treatment for  COPD exacerbation but she had not yet picked up her steroid Dosepak nor azithromycin. > Found to have diffuse wheezing and intermittent desaturations in the ED.  Received azithromycin, ceftriaxone, Solu-Medrol in the ED as well. > Was noted to intermittently desaturate to the mid 80s on room air but most of the time was in the low 90s. - Monitor on telemetry - Continue with daily steroids - Continue with daily azithromycin - Scheduled Atrovent - As needed albuterol - Check flu COVID RSV and full respiratory viral panel to evaluate for viral etiology given lack of leukocytosis.  Hyponatremia > Mild hyponatremia which corrects to 133-134. - Will continue to monitor for now  Hypertension - Continue home amlodipine, losartan, Lasix  Diastolic CHF > Last echo was in 2016 with EF 60-65%, G1 DD, normal RV function. - Continue home losartan, Lasix  Hiatal Hernia Recurrent Vomiting > Follows with GI, has some recurrent vomiting.  This is in the setting of large hiatal hernia, swallow study looked okay.  Considering EGD but would need to be done inpatient if done per daughter. - Daughter request discussion with her GI team while she is here for consideration of EGD while admitted when her breathing improves.  GERD - Continue home PPI  Hyperlipidemia PAD - Continue home aspirin  Diabetes - SSI  CKD 3B > Calculated creatinine here and clearance here is 38.7.  Creatinine remained stable. - Trend renal function and electrolytes  DVT prophylaxis: Lovenox Code Status:   Full, discussed at bedside.  Family Communication:  Updated at bedside  Disposition Plan:   Patient is from:  [place] [facility]  Anticipated DC to:  Same as above  Anticipated DC date:  1 to 3 days  Anticipated DC barriers: None  Consults called:  None Admission status:  Observation, telemetry  Severity of Illness: The appropriate patient status for this patient is OBSERVATION. Observation status is judged to  be reasonable and necessary in order to provide the required intensity of service to ensure the patient's safety. The patient's presenting symptoms, physical exam findings, and initial radiographic and laboratory data in the context of their medical condition is felt to place them at decreased risk for further clinical deterioration. Furthermore, it is anticipated that the patient will be medically stable for discharge from the hospital within 2 midnights of admission.    Marcelyn Bruins MD [facility]  How to contact the [facility] Attending or Consulting provider [place] or covering provider during after hours [place], for this patient?   Check the care team in [facility] and look for a) attending/consulting [facility] provider listed and b) the [facility] team listed Log into [email or web address] and use [place] universal password to access. If you do not have the password, please contact the hospital operator. Locate the [facility] provider you are looking for under [facility] and page to a number that you can be directly reached. If you still have difficulty reaching the provider, please page the [facility] (Director on Call) for the Hospitalists listed on amion for assistance.  09/28/2022, 1:29 PM

## 2022-09-28 NOTE — Telephone Encounter (Signed)
Called and spoke with [name]. She wanted to know if the ED had been notified about why her mother was being sent their as the nursing home did not tell the hospital anything. I advised her that the abnormal labs were in her chart and that the ED doctors could see them as well as the recommendation to go to the ED. She verbalized understanding.   Nothing further needed at time of call.

## 2022-09-29 DIAGNOSIS — J441 Chronic obstructive pulmonary disease with (acute) exacerbation: Secondary | ICD-10-CM | POA: Diagnosis not present

## 2022-09-29 LAB — COMPREHENSIVE METABOLIC PANEL
ALT: 10 U/L (ref 0–44)
AST: 14 U/L — ABNORMAL LOW (ref 15–41)
Albumin: 2.5 g/dL — ABNORMAL LOW (ref 3.5–5.0)
Alkaline Phosphatase: 78 U/L (ref 38–126)
Anion gap: 10 (ref 5–15)
BUN: 5 mg/dL — ABNORMAL LOW (ref 8–23)
CO2: 32 mmol/L (ref 22–32)
Calcium: 9.1 mg/dL (ref 8.9–10.3)
Chloride: 91 mmol/L — ABNORMAL LOW (ref 98–111)
Creatinine, Ser: 0.56 mg/dL (ref 0.44–1.00)
GFR, Estimated: >60 mL/min (ref >60)
Glucose, Bld: 200 mg/dL — ABNORMAL HIGH (ref 70–99)
Potassium: 4.3 mmol/L (ref 3.5–5.1)
Sodium: 133 mmol/L — ABNORMAL LOW (ref 135–145)
Total Bilirubin: 0.3 mg/dL (ref 0.3–1.2)
Total Protein: 7 g/dL (ref 6.5–8.1)

## 2022-09-29 LAB — GLUCOSE, CAPILLARY
Glucose-Capillary: 143 mg/dL — ABNORMAL HIGH (ref 70–99)
Glucose-Capillary: 124 mg/dL — ABNORMAL HIGH (ref 70–99)
Glucose-Capillary: 222 mg/dL — ABNORMAL HIGH (ref 70–99)
Glucose-Capillary: 238 mg/dL — ABNORMAL HIGH (ref 70–99)

## 2022-09-29 LAB — CBC
HCT: 37.6 % (ref 36.0–46.0)
Hemoglobin: 11.6 g/dL — ABNORMAL LOW (ref 12.0–15.0)
MCH: 27.4 pg (ref 26.0–34.0)
MCHC: 30.9 g/dL (ref 30.0–36.0)
MCV: 88.9 fL (ref 80.0–100.0)
Platelets: 221 10*3/uL (ref 150–400)
RBC: 4.23 MIL/uL (ref 3.87–5.11)
RDW: 14.7 % (ref 11.5–15.5)
WBC: 4.4 10*3/uL (ref 4.0–10.5)
nRBC: 0 % (ref 0.0–0.2)

## 2022-09-29 MED ORDER — ENSURE ENLIVE PO LIQD
237.0000 mL | Freq: Two times a day (BID) | ORAL | Status: DC
  Administered 2022-09-30 – 2022-10-02 (×5): 237 mL via ORAL

## 2022-09-29 MED ORDER — GUAIFENESIN 100 MG/5ML PO LIQD
10.0000 mL | Freq: Four times a day (QID) | ORAL | Status: DC | PRN
  Administered 2022-09-29 – 2022-09-30 (×2): 10 mL via ORAL
  Filled 2022-09-29 (×2): qty 10

## 2022-09-29 MED ORDER — DICLOFENAC SODIUM 1 % EX GEL
2.0000 g | Freq: Four times a day (QID) | CUTANEOUS | Status: DC
  Administered 2022-09-29 – 2022-10-02 (×11): 2 g via TOPICAL
  Filled 2022-09-29 (×2): qty 100

## 2022-09-29 NOTE — Plan of Care (Signed)

## 2022-09-29 NOTE — Progress Notes (Signed)
PROGRESS NOTE    Heidi Dominguez  [id number] DOB: 06-16-1921 DOA: 09/28/2022 PCP: Heidi Manes, MD    Brief Narrative:  89 year old with history of chronic bronchitis, hiatal hernia and dysphagia, hyperlipidemia, peripheral artery disease, GERD and hypertension, CKD stage III who is recently at nursing home and bedbound for the last 2 years presented with about 2 months of ongoing cough, wheezing.  Symptoms worse for last 2 weeks.  Treated with multiple therapies as outpatient, doxycycline, recently prescribed steroids and azithromycin(not taken any so far) .  She went to see her pulmonary, on lab showed sodium of 127.  Sent to ER with persistent symptoms.  In the emergency room, diffusely wheezing with intermittent hypoxemia and intermittently desatting to 85% on room air.  Admitted with COPD exacerbation.   Assessment & Plan:   Acute exacerbation of chronic bronchitis: Acute RSV infection. Aggressive bronchodilator therapy, IV steroids-oral steroids, inhalational steroids, scheduled and as needed bronchodilators, deep breathing exercises, incentive spirometry, chest physiotherapy.  Continue azithromycin. Supplemental oxygen to keep saturations more than 90%.  Hiatal hernia with recurrent vomiting and aspiration: Probably exacerbating bronchitis symptoms and cough. Remains on PPI.  She will need all GERD precautions.  She was seen by [facility] as outpatient.?  Upper GI endoscopy. I will touch base with on-call [facility] to discuss whether she has any benefit of doing an upper GI endoscopy. Patient will need dietary modification, consult dietitian.  Also consulted speech therapy.  Essential hypertension, blood pressure stable on amlodipine, losartan and Lasix.  CKD stage IIIb: At about baseline.  Monitor.   DVT prophylaxis: enoxaparin (LOVENOX) injection 40 mg Start: 09/28/22 1400   Code Status: Full code Family Communication: Daughter on the phone Disposition Plan: Status is:  Observation The patient will require care spanning > 2 midnights and should be moved to inpatient because: RSV infection, intolerance to diet.     Consultants:  None  Procedures:  None  Antimicrobials:  Azithromycin 2/15---   Subjective: Patient seen in the morning rounds.  Very interactive 89 year old.  She tells me that she has been struggling with hiatal hernia for some time.  [name].  Afebrile.  Cough is mostly dry.  I called and discussed case with patient's daughter on the phone.  Patient's daughter was asking whether I can touch base with her GI doctor to do upper GI endoscopy as they were considering whether they can "push the hernia down with endoscopy"?.  Objective: Vitals:   09/29/22 0100 09/29/22 0546 09/29/22 0743 09/29/22 0836  BP:  (!) 153/66 (!) 146/62   Pulse:  85 86   Resp:  20 18   Temp:  97.7 F (36.5 C) 98.1 F (36.7 C)   TempSrc:  Oral Oral   SpO2: 97% 100% 98% 100%  Weight:  78.9 kg    Height:        Intake/Output Summary (Last 24 hours) at 09/29/2022 1258 Last data filed at 09/29/2022 1229 Gross per 24 hour  Intake 1263 ml  Output 2000 ml  Net -737 ml   Filed Weights   09/28/22 1107 09/28/22 1620 09/29/22 0546  Weight: 77.7 kg 78.3 kg 78.9 kg    Examination:  General exam: Appears calm and comfortable  On minimal oxygen.  She is alert awake and oriented x 4.  Pleasant to conversation. Respiratory system: Mostly conducted upper airway sounds.  Coarse crackles at the bases. Cardiovascular system: S1 & S2 heard, RRR.  Asymmetrical legs.  Left calf bigger than right.  No palpable edema. Gastrointestinal system: Abdomen is nondistended, soft and nontender. No organomegaly or masses felt. Normal bowel sounds heard. Central nervous system: Alert and oriented. No focal neurological deficits.    Data Reviewed: I have personally reviewed following labs and imaging studies  CBC: Recent Labs  Lab 09/27/22 1218  09/28/22 1106 09/29/22 0028  WBC 7.6 7.5 4.4  NEUTROABS 5.8 5.4  --   HGB 12.2 12.4 11.6*  HCT 38.3 38.8 37.6  MCV 87.0 88.6 88.9  PLT 240.0 225 [id number]   Basic Metabolic Panel: Recent Labs  Lab 09/27/22 1218 09/28/22 1106 09/29/22 0028  NA 127* 130* 133*  K 4.0 4.2 4.3  CL 85* 88* 91*  CO2 37* 32 32  GLUCOSE 254* 287* 200*  BUN 10 8 5*  CREATININE 0.53 0.64 0.56  CALCIUM 9.9 9.4 9.1  MG  --  1.8  --    GFR: Estimated Creatinine Clearance: 39 mL/min (by C-G formula based on SCr of 0.56 mg/dL). Liver Function Tests: Recent Labs  Lab 09/29/22 0028  AST 14*  ALT 10  ALKPHOS 78  BILITOT 0.3  PROT 7.0  ALBUMIN 2.5*   No results for input(s): "LIPASE", "AMYLASE" in the last 168 hours. No results for input(s): "AMMONIA" in the last 168 hours. Coagulation Profile: No results for input(s): "INR", "PROTIME" in the last 168 hours. Cardiac Enzymes: No results for input(s): "CKTOTAL", "CKMB", "CKMBINDEX", "TROPONINI" in the last 168 hours. BNP (last 3 results) Recent Labs    09/27/22 1218  PROBNP 77.0   HbA1C: No results for input(s): "HGBA1C" in the last 72 hours. CBG: Recent Labs  Lab 09/28/22 1624 09/28/22 2112 09/29/22 0616 09/29/22 1101  GLUCAP 216* 204* 143* 124*   Lipid Profile: No results for input(s): "CHOL", "HDL", "LDLCALC", "TRIG", "CHOLHDL", "LDLDIRECT" in the last 72 hours. Thyroid Function Tests: No results for input(s): "TSH", "T4TOTAL", "FREET4", "T3FREE", "THYROIDAB" in the last 72 hours. Anemia Panel: No results for input(s): "VITAMINB12", "FOLATE", "FERRITIN", "TIBC", "IRON", "RETICCTPCT" in the last 72 hours. Sepsis Labs: No results for input(s): "PROCALCITON", "LATICACIDVEN" in the last 168 hours.  Recent Results (from the past 240 hour(s))  Resp panel by RT-PCR (RSV, Flu A&B, Covid) Anterior Nasal Swab     Status: Abnormal   Collection Time: 09/28/22  1:20 PM   Specimen: Anterior Nasal Swab  Result Value Ref Range Status   SARS  Coronavirus 2 by RT PCR NEGATIVE NEGATIVE Final   Influenza A by PCR NEGATIVE NEGATIVE Final   Influenza B by PCR NEGATIVE NEGATIVE Final    Comment: (NOTE) The Xpert Xpress SARS-CoV-2/FLU/RSV plus assay is intended as an aid in the diagnosis of influenza from Nasopharyngeal swab specimens and should not be used as a sole basis for treatment. Nasal washings and aspirates are unacceptable for Xpert Xpress SARS-CoV-2/FLU/RSV testing.  Fact Sheet for Patients: EntrepreneurPulse.com.au  Fact Sheet for Healthcare Providers: IncredibleEmployment.be  This test is not yet approved or cleared by the [place] FDA and has been authorized for detection and/or diagnosis of SARS-CoV-2 by FDA under an Emergency Use Authorization (EUA). This EUA will remain in effect (meaning this test can be used) for the duration of the COVID-19 declaration under Section 564(b)(1) of the Act, 21 U.S.C. section 360bbb-3(b)(1), unless the authorization is terminated or revoked.     Resp Syncytial Virus by PCR POSITIVE (A) NEGATIVE Final    Comment: (NOTE) Fact Sheet for Patients: EntrepreneurPulse.com.au  Fact Sheet for Healthcare Providers: IncredibleEmployment.be  This test is not yet approved or cleared by the [place] and has been authorized for detection and/or diagnosis of SARS-CoV-2 by FDA under an Emergency Use Authorization (EUA). This EUA will remain in effect (meaning this test can be used) for the duration of the COVID-19 declaration under Section 564(b)(1) of the Act, 21 U.S.C. section 360bbb-3(b)(1), unless the authorization is terminated or revoked.  Performed at [facility], [place] [address] [place], [place] [address]   Respiratory (~20 pathogens) panel by PCR     Status: Abnormal   Collection Time: 09/28/22  1:20 PM   Specimen: Nasopharyngeal Swab; Respiratory  Result Value Ref Range Status    Adenovirus NOT DETECTED NOT DETECTED Final   Coronavirus 229E NOT DETECTED NOT DETECTED Final    Comment: (NOTE) The Coronavirus on the Respiratory Panel, DOES NOT test for the novel  Coronavirus (2019 nCoV)    Coronavirus HKU1 NOT DETECTED NOT DETECTED Final   Coronavirus NL63 NOT DETECTED NOT DETECTED Final   Coronavirus OC43 NOT DETECTED NOT DETECTED Final   Metapneumovirus NOT DETECTED NOT DETECTED Final   Rhinovirus / Enterovirus NOT DETECTED NOT DETECTED Final   Influenza A NOT DETECTED NOT DETECTED Final   Influenza B NOT DETECTED NOT DETECTED Final   Parainfluenza Virus 1 NOT DETECTED NOT DETECTED Final   Parainfluenza Virus 2 NOT DETECTED NOT DETECTED Final   Parainfluenza Virus 3 NOT DETECTED NOT DETECTED Final   Parainfluenza Virus 4 NOT DETECTED NOT DETECTED Final   Respiratory Syncytial Virus DETECTED (A) NOT DETECTED Final   Bordetella pertussis NOT DETECTED NOT DETECTED Final   Bordetella Parapertussis NOT DETECTED NOT DETECTED Final   Chlamydophila pneumoniae NOT DETECTED NOT DETECTED Final   Mycoplasma pneumoniae NOT DETECTED NOT DETECTED Final    Comment: Performed at [facility], [address] [place], [place] [address]         Radiology Studies: DG Chest Port 1 View  Result Date: 09/28/2022 CLINICAL DATA:  Cough, wheezing EXAM: PORTABLE CHEST 1 VIEW COMPARISON:  09/27/2022 FINDINGS: Cardiomegaly. Large hiatal hernia. Both lungs are clear. Status post bilateral shoulder arthroplasty. IMPRESSION: 1. Cardiomegaly without acute abnormality of the lungs. 2. Large hiatal hernia. Electronically Signed   By: Delanna Ahmadi M.D.   On: 09/28/2022 11:33        Scheduled Meds:  amLODipine  10 mg Oral q morning   artificial tears   Both Eyes BID   aspirin EC  162 mg Oral QHS   [START ON 10/01/2022] cyanocobalamin  1,000 mcg Oral Q Sun   enoxaparin (LOVENOX) injection  40 mg Subcutaneous Q24H   furosemide  20 mg Oral Daily   insulin aspart  0-15 Units  Subcutaneous TID WC   ipratropium  0.5 mg Nebulization Q6H   losartan  50 mg Oral Daily   ondansetron (ZOFRAN) IV  4 mg Intravenous Once   pantoprazole  40 mg Oral BID   predniSONE  40 mg Oral Q breakfast   sodium chloride flush  3 mL Intravenous Q12H   Continuous Infusions:  azithromycin 250 mg (09/29/22 1021)     LOS: 0 days    Time spent: 35 minutes    Barb Merino, MD [facility] Pager [phone]

## 2022-09-29 NOTE — Evaluation (Signed)
Clinical/Bedside Swallow Evaluation Patient Details  Name: Heidi Dominguez MRN: [id number] Date of Birth: 1921/06/16  Today's Date: 09/29/2022 Time: SLP Start Time (ACUTE ONLY): 1500 SLP Stop Time (ACUTE ONLY): [id number] SLP Time Calculation (min) (ACUTE ONLY): 18 min  Past Medical History:  Past Medical History:  Diagnosis Date   Acute pancreatitis    hx of    Arthritis    "qwhere" (07/04/2018)   Chronic back pain    "all over" (07/04/2018)   Chronic diastolic CHF (congestive heart failure) ([place])    Chronic kidney disease    stage  3 chronic kidney disease    Complication of anesthesia    "I have a hard time waking up"   Degenerative joint disease of shoulder region    Dry eye syndrome    Dyspnea    Family history of adverse reaction to anesthesia    "daughter has the shakes when she wakes up"   GERD (gastroesophageal reflux disease)    Heart murmur    High cholesterol    History of hiatal hernia    HOH (hard of hearing)    Hypertension    Impacted cerumen of both ears    hx of    Muscle weakness (generalized)    Osteoarthritis    Overactive bladder    Pancreatitis    hx of    Phlebitis    "BLE"   Rotator cuff tear    right    Tear of right supraspinatus tendon    Thrombocytopenia (HCC)    hx of    Type II diabetes mellitus ([place])    Urinary tract infection    hx of    UTI (urinary tract infection) 09/12/2018   Xerosis cutis    hx of    Past Surgical History:  Past Surgical History:  Procedure Laterality Date   ABDOMINAL AORTOGRAM W/LOWER EXTREMITY N/A 12/22/2020   Procedure: ABDOMINAL AORTOGRAM W/LOWER EXTREMITY;  Surgeon: Cherre Robins, MD;  Location: [facility];  Service: Cardiovascular;  Laterality: N/A;   ABDOMINAL HYSTERECTOMY     BACK SURGERY     CATARACT EXTRACTION W/ INTRAOCULAR LENS  IMPLANT, BILATERAL Bilateral    CHOLECYSTECTOMY  2016   ERCP N/A 08/30/2018   Procedure: ENDOSCOPIC RETROGRADE CHOLANGIOPANCREATOGRAPHY (ERCP);  Surgeon:  Clarene Essex, MD;  Location: [facility];  Service: Endoscopy;  Laterality: N/A;   FIXATION KYPHOPLASTY     INCISION AND DRAINAGE Right 11/08/2018   Procedure: INCISION AND DRAINAGE right shoulder, placement of antibiotic beads ;  Surgeon: [name], MD;  Location: [facility];  Service: Orthopedics;  Laterality: Right;   JOINT REPLACEMENT     LAPAROSCOPIC CHOLECYSTECTOMY SINGLE SITE WITH INTRAOPERATIVE CHOLANGIOGRAM N/A 04/11/2015   Procedure: LAPAROSCOPIC LYSIS OF ADHESIONS, LAPAROSCOPIC CHOLECYSTECTOMY WITH INTRAOPERATIVE CHOLANGIOGRAM;  Surgeon: Michael Boston, MD;  Location: [facility];  Service: General;  Laterality: N/A;   PANCREATIC STENT PLACEMENT  08/30/2018   Procedure: PANCREATIC STENT PLACEMENT;  Surgeon: Clarene Essex, MD;  Location: [facility];  Service: Endoscopy;;   PERIPHERAL VASCULAR INTERVENTION  12/22/2020   Procedure: PERIPHERAL VASCULAR INTERVENTION;  Surgeon: Cherre Robins, MD;  Location: [facility];  Service: Cardiovascular;;  left anterior tibial artery   REMOVAL OF STONES  08/30/2018   Procedure: REMOVAL OF STONES;  Surgeon: Clarene Essex, MD;  Location: [facility];  Service: Endoscopy;;   SHOULDER OPEN ROTATOR CUFF REPAIR Bilateral    SPHINCTEROTOMY  08/30/2018   Procedure: SPHINCTEROTOMY;  Surgeon: Clarene Essex, MD;  Location: [facility];  Service: Endoscopy;;   TOTAL KNEE ARTHROPLASTY Bilateral    HPI:  Pt is a 89 y.o. female who presented with about 2 months of ongoing cough, wheezing. Pt dx with acute exacerbation of chronic bronchitis with acute RSV infection. CXR 2/15: no acute abnormality of the lungs. PMH: chronic bronchitis, hiatal hernia and dysphagia, hyperlipidemia, peripheral artery disease, GERD and hypertension, CKD stage III. MBS in 2021 pt again found to have normal oropharyngeal function but risk of postprandial aspiration. MBS 07/20/22: oral and ororpharyngeal function WNL, particularly for age. After a few ounces of solids and liquids pt began belching  and reported pressure; esophageal sweep showed barium throughout esophageal column. Pt followed by outpatient GI and no SLP follow up was recommended.    Assessment / Plan / Recommendation  Clinical Impression  Pt was seen for bedside swallow evaluation and she denied any acute change in oropharyngeal swallow function or worsening of esophageal symptoms. Oral mechanism exam was [facility] and she presented with full dentures. Pt's presentation appears consistent with that noted during the outpatient modified barium swallow study in December, 2023. Pt's oropharyngeal dysphagia appeared [facility] without signs of aspiration. However, esophageal dysphagia and post-prandial aspiration are suspected considering pt's report of globus sensation with solids and liquids followed by episodes of eructation, coughing, and a single instance of regurgitation with water. GERD and esophageal precautions were reinforced and pt endorsed use of some of these. Pt may have up to a regular texture diet from an oropharyngeal standpoint, but it may be prudent to continue the dysphagia 3 diet considering her esophageal challenges. SLP is in agreement with Dr. Nena Alexander plan to follow up with GI; further acute SLP services are not clinically indicated at this time. SLP Visit Diagnosis: Dysphagia, unspecified (R13.10)    Aspiration Risk   (pt is at risk for post-prandial aspiration)    Diet Recommendation Dysphagia 3 (Mech soft);Thin liquid   Liquid Administration via: Cup;Straw Medication Administration: Crushed with puree Supervision: Staff to assist with self feeding Compensations: Slow rate;Small sips/bites;Follow solids with liquid Postural Changes: Seated upright at 90 degrees    Other  Recommendations Recommended Consults: Consider esophageal assessment;Consider GI evaluation Oral Care Recommendations: Oral care BID    Recommendations for follow up therapy are one component of a multi-disciplinary discharge planning process,  led by the attending physician.  Recommendations may be updated based on patient status, additional functional criteria and insurance authorization.  Follow up Recommendations No SLP follow up      Assistance Recommended at Discharge    Functional Status Assessment    Frequency and Duration            Prognosis        Swallow Study   General Date of Onset: 09/28/22 HPI: Pt is a 89 y.o. female who presented with about 2 months of ongoing cough, wheezing. Pt dx with acute exacerbation of chronic bronchitis with acute RSV infection. CXR 2/15: no acute abnormality of the lungs. PMH: chronic bronchitis, hiatal hernia and dysphagia, hyperlipidemia, peripheral artery disease, GERD and hypertension, CKD stage III. MBS in 2021 pt again found to have normal oropharyngeal function but risk of postprandial aspiration. MBS 07/20/22: oral and ororpharyngeal function WNL, particularly for age. After a few ounces of solids and liquids pt began belching and reported pressure; esophageal sweep showed barium throughout esophageal column. Pt followed by outpatient GI and no SLP follow up was recommended. Type of Study: Bedside Swallow Evaluation Previous Swallow Assessment: See HPI Diet Prior to  this Study: Dysphagia 3 (mechanical soft);Thin liquids (Level 0) Temperature Spikes Noted: No Respiratory Status: Nasal cannula History of Recent Intubation: No Behavior/Cognition: Alert;Cooperative;Pleasant mood Oral Cavity Assessment: Within Functional Limits Oral Care Completed by SLP: No Oral Cavity - Dentition: Dentures, top;Dentures, bottom Vision: Functional for self-feeding Self-Feeding Abilities: Needs assist Patient Positioning: Upright in bed;Postural control adequate for testing Baseline Vocal Quality: Normal Volitional Swallow: Able to elicit    Oral/Motor/Sensory Function Overall Oral Motor/Sensory Function: Within functional limits   Ice Chips Ice chips: Not tested   Thin Liquid Thin Liquid:  Within functional limits Presentation: Straw    Nectar Thick Nectar Thick Liquid: Not tested   Honey Thick Honey Thick Liquid: Not tested   Puree Puree: Within functional limits Presentation: Spoon   Solid     Solid: Within functional limits Presentation: [place] [place], [place], [place] Office number [phone]  Horton Marshall 09/29/2022,3:37 PM

## 2022-09-30 DIAGNOSIS — J441 Chronic obstructive pulmonary disease with (acute) exacerbation: Secondary | ICD-10-CM | POA: Diagnosis not present

## 2022-09-30 LAB — GLUCOSE, CAPILLARY
Glucose-Capillary: 129 mg/dL — ABNORMAL HIGH (ref 70–99)
Glucose-Capillary: 275 mg/dL — ABNORMAL HIGH (ref 70–99)
Glucose-Capillary: 222 mg/dL — ABNORMAL HIGH (ref 70–99)
Glucose-Capillary: 305 mg/dL — ABNORMAL HIGH (ref 70–99)

## 2022-09-30 MED ORDER — HYDROCODONE BIT-HOMATROP MBR 5-1.5 MG/5ML PO SOLN
5.0000 mL | Freq: Four times a day (QID) | ORAL | Status: DC | PRN
  Administered 2022-09-30 – 2022-10-02 (×2): 5 mL via ORAL
  Filled 2022-09-30 (×2): qty 5

## 2022-09-30 MED ORDER — IPRATROPIUM BROMIDE 0.02 % IN SOLN
0.5000 mg | Freq: Three times a day (TID) | RESPIRATORY_TRACT | Status: DC
  Administered 2022-09-30 – 2022-10-01 (×2): 0.5 mg via RESPIRATORY_TRACT
  Filled 2022-09-30 (×2): qty 2.5

## 2022-09-30 MED ORDER — AZITHROMYCIN 250 MG PO TABS
250.0000 mg | ORAL_TABLET | Freq: Every day | ORAL | Status: AC
  Administered 2022-09-30 – 2022-10-02 (×3): 250 mg via ORAL
  Filled 2022-09-30 (×3): qty 1

## 2022-09-30 NOTE — Progress Notes (Signed)
PHARMACIST - PHYSICIAN COMMUNICATION DR:   Sloan Leiter CONCERNING: Antibiotic IV to Oral Route Change Policy  RECOMMENDATION: This patient is receiving azith by the intravenous route.  Based on criteria approved by the Pharmacy and Therapeutics Committee, the antibiotic(s) is/are being converted to the equivalent oral dose form(s).   DESCRIPTION: These criteria include: Patient being treated for a respiratory tract infection, urinary tract infection, cellulitis or clostridium difficile associated diarrhea if on metronidazole The patient is not neutropenic and does not exhibit a GI malabsorption state The patient is eating (either orally or via tube) and/or has been taking other orally administered medications for a least 24 hours The patient is improving clinically and has a Tmax < 100.5  If you have questions about this conversion, please contact the Pharmacy Department  []$   [phone] )  [facility] []$   [phone] )  [facility] [x]$   [phone] )  [facility] []$   [phone] )  [facility] []$   [phone] )  [facility]

## 2022-09-30 NOTE — Care Management Obs Status (Signed)
[place] NOTIFICATION   Patient Details  Name: Heidi Dominguez MRN: [id number] Date of Birth: [birth year]   Medicare Observation Status Notification Given:  Yes    Tom-Johnson, Renea Ee, RN 09/30/2022, 9:00 AM

## 2022-09-30 NOTE — Progress Notes (Signed)
PROGRESS NOTE    Heidi Dominguez  [id number] DOB: 05-09-1921 DOA: 09/28/2022 PCP: Lajean Manes, MD    Brief Narrative:  89 year old with history of chronic bronchitis, hiatal hernia and dysphagia, hyperlipidemia, peripheral artery disease, GERD and hypertension, CKD stage III who is recently at nursing home and bedbound for the last 2 years presented with about 2 months of ongoing cough, wheezing.  Symptoms worse for last 2 weeks.  Treated with multiple therapies as outpatient, doxycycline, recently prescribed steroids and azithromycin(not taken any so far) .  She went to see her pulmonary, on lab showed sodium of 127.  Sent to ER with persistent symptoms.  In the emergency room, diffusely wheezing with intermittent hypoxemia and intermittently desatting to 85% on room air.  Admitted with COPD exacerbation.   Assessment & Plan:   Acute exacerbation of chronic bronchitis: Acute RSV infection. Continue oral steroids, inhalational steroids, scheduled and as needed bronchodilators, deep breathing exercises, incentive spirometry, chest physiotherapy.  Continue azithromycin for 5 days. Supplemental oxygen to keep saturations more than 90%. Difficult to clear secretions and cough due to bedbound status.  Hiatal hernia with recurrent vomiting and aspiration: Probably exacerbating bronchitis symptoms and cough. Remains on PPI.  She will need all GERD precautions.  Large hiatal hernia, probably causing chronic aspiration. Case discussed with [facility], on-call physician.  Patient currently with RSV and ongoing cough, suggested symptomatic management.  Patient with regurgitation but no dysphagia or odynophagia.  She will not benefit with upper GI endoscopy. Seen by speech therapy, soft diet. Patient will need dietary modification, consult dietitian.  She will benefit with small frequent meals and sitting upright.  Essential hypertension, blood pressure stable on amlodipine, losartan and  Lasix.  CKD stage IIIb: At about baseline.  Monitor.   DVT prophylaxis: enoxaparin (LOVENOX) injection 40 mg Start: 09/28/22 1400   Code Status: Full code Family Communication: Daughter on the phone Disposition Plan: Status is: Observation The patient will require care spanning > 2 midnights and should be moved to inpatient because: RSV infection, intolerance to diet.     Consultants:  None  Procedures:  None  Antimicrobials:  Azithromycin 2/15---   Subjective:  Patient seen and examined.  She had an episode of acute shortness of breath with coughing and vomiting last night and she was very afraid of it.  Remains afebrile.  Currently on 2 L oxygen.  Persistent dry cough.  Objective: Vitals:   09/30/22 0222 09/30/22 0555 09/30/22 0900 09/30/22 1006  BP:  (!) 159/60 (!) 149/58   Pulse:  77 82   Resp:  18 18 18  $ Temp:  98.6 F (37 C) 98.2 F (36.8 C)   TempSrc:  Oral Oral   SpO2: 98% 99% 100%   Weight:  79.4 kg    Height:        Intake/Output Summary (Last 24 hours) at 09/30/2022 1030 Last data filed at 09/30/2022 0900 Gross per 24 hour  Intake 1137 ml  Output 2150 ml  Net -1013 ml    Filed Weights   09/28/22 1620 09/29/22 0546 09/30/22 0555  Weight: 78.3 kg 78.9 kg 79.4 kg    Examination:  General exam: Appears anxious.  Not in obvious distress but anxious with shortness of breath and dry nagging cough. On minimal oxygen.  She is alert awake and oriented x 4.  Pleasant to conversation. Respiratory system: Mostly conducted upper airway sounds.  Coarse crackles at the bases. Cardiovascular system: S1 & S2 heard, RRR.  Asymmetrical legs.  Left calf bigger than right.  No palpable edema. Gastrointestinal system: Abdomen is nondistended, soft and nontender. No organomegaly or masses felt. Normal bowel sounds heard. Central nervous system: Alert and oriented. No focal neurological deficits.    Data Reviewed: I have personally reviewed following labs and  imaging studies  CBC: Recent Labs  Lab 09/27/22 1218 09/28/22 1106 09/29/22 0028  WBC 7.6 7.5 4.4  NEUTROABS 5.8 5.4  --   HGB 12.2 12.4 11.6*  HCT 38.3 38.8 37.6  MCV 87.0 88.6 88.9  PLT 240.0 225 [id number]    Basic Metabolic Panel: Recent Labs  Lab 09/27/22 1218 09/28/22 1106 09/29/22 0028  NA 127* 130* 133*  K 4.0 4.2 4.3  CL 85* 88* 91*  CO2 37* 32 32  GLUCOSE 254* 287* 200*  BUN 10 8 5*  CREATININE 0.53 0.64 0.56  CALCIUM 9.9 9.4 9.1  MG  --  1.8  --     GFR: Estimated Creatinine Clearance: 39.1 mL/min (by C-G formula based on SCr of 0.56 mg/dL). Liver Function Tests: Recent Labs  Lab 09/29/22 0028  AST 14*  ALT 10  ALKPHOS 78  BILITOT 0.3  PROT 7.0  ALBUMIN 2.5*    No results for input(s): "LIPASE", "AMYLASE" in the last 168 hours. No results for input(s): "AMMONIA" in the last 168 hours. Coagulation Profile: No results for input(s): "INR", "PROTIME" in the last 168 hours. Cardiac Enzymes: No results for input(s): "CKTOTAL", "CKMB", "CKMBINDEX", "TROPONINI" in the last 168 hours. BNP (last 3 results) Recent Labs    09/27/22 1218  PROBNP 77.0    HbA1C: No results for input(s): "HGBA1C" in the last 72 hours. CBG: Recent Labs  Lab 09/29/22 0616 09/29/22 1101 09/29/22 1623 09/29/22 2049 09/30/22 0627  GLUCAP 143* 124* 222* 238* 129*    Lipid Profile: No results for input(s): "CHOL", "HDL", "LDLCALC", "TRIG", "CHOLHDL", "LDLDIRECT" in the last 72 hours. Thyroid Function Tests: No results for input(s): "TSH", "T4TOTAL", "FREET4", "T3FREE", "THYROIDAB" in the last 72 hours. Anemia Panel: No results for input(s): "VITAMINB12", "FOLATE", "FERRITIN", "TIBC", "IRON", "RETICCTPCT" in the last 72 hours. Sepsis Labs: No results for input(s): "PROCALCITON", "LATICACIDVEN" in the last 168 hours.  Recent Results (from the past 240 hour(s))  Resp panel by RT-PCR (RSV, Flu A&B, Covid) Anterior Nasal Swab     Status: Abnormal   Collection Time: 09/28/22   1:20 PM   Specimen: Anterior Nasal Swab  Result Value Ref Range Status   SARS Coronavirus 2 by RT PCR NEGATIVE NEGATIVE Final   Influenza A by PCR NEGATIVE NEGATIVE Final   Influenza B by PCR NEGATIVE NEGATIVE Final    Comment: (NOTE) The Xpert Xpress SARS-CoV-2/FLU/RSV plus assay is intended as an aid in the diagnosis of influenza from Nasopharyngeal swab specimens and should not be used as a sole basis for treatment. Nasal washings and aspirates are unacceptable for Xpert Xpress SARS-CoV-2/FLU/RSV testing.  Fact Sheet for Patients: EntrepreneurPulse.com.au  Fact Sheet for Healthcare Providers: IncredibleEmployment.be  This test is not yet approved or cleared by the [place] FDA and has been authorized for detection and/or diagnosis of SARS-CoV-2 by FDA under an Emergency Use Authorization (EUA). This EUA will remain in effect (meaning this test can be used) for the duration of the COVID-19 declaration under Section 564(b)(1) of the Act, 21 U.S.C. section 360bbb-3(b)(1), unless the authorization is terminated or revoked.     Resp Syncytial Virus by PCR POSITIVE (A) NEGATIVE Final    Comment: (NOTE) Fact Sheet for Patients:  EntrepreneurPulse.com.au  Fact Sheet for Healthcare Providers: IncredibleEmployment.be  This test is not yet approved or cleared by the [place] FDA and has been authorized for detection and/or diagnosis of SARS-CoV-2 by FDA under an Emergency Use Authorization (EUA). This EUA will remain in effect (meaning this test can be used) for the duration of the COVID-19 declaration under Section 564(b)(1) of the Act, 21 U.S.C. section 360bbb-3(b)(1), unless the authorization is terminated or revoked.  Performed at [facility], [place] [address] [place], [place] [address]   Respiratory (~20 pathogens) panel by PCR     Status: Abnormal   Collection Time: 09/28/22  1:20 PM    Specimen: Nasopharyngeal Swab; Respiratory  Result Value Ref Range Status   Adenovirus NOT DETECTED NOT DETECTED Final   Coronavirus 229E NOT DETECTED NOT DETECTED Final    Comment: (NOTE) The Coronavirus on the Respiratory Panel, DOES NOT test for the novel  Coronavirus (2019 nCoV)    Coronavirus HKU1 NOT DETECTED NOT DETECTED Final   Coronavirus NL63 NOT DETECTED NOT DETECTED Final   Coronavirus OC43 NOT DETECTED NOT DETECTED Final   Metapneumovirus NOT DETECTED NOT DETECTED Final   Rhinovirus / Enterovirus NOT DETECTED NOT DETECTED Final   Influenza A NOT DETECTED NOT DETECTED Final   Influenza B NOT DETECTED NOT DETECTED Final   Parainfluenza Virus 1 NOT DETECTED NOT DETECTED Final   Parainfluenza Virus 2 NOT DETECTED NOT DETECTED Final   Parainfluenza Virus 3 NOT DETECTED NOT DETECTED Final   Parainfluenza Virus 4 NOT DETECTED NOT DETECTED Final   Respiratory Syncytial Virus DETECTED (A) NOT DETECTED Final   Bordetella pertussis NOT DETECTED NOT DETECTED Final   Bordetella Parapertussis NOT DETECTED NOT DETECTED Final   Chlamydophila pneumoniae NOT DETECTED NOT DETECTED Final   Mycoplasma pneumoniae NOT DETECTED NOT DETECTED Final    Comment: Performed at [facility], [address] [place], [place] [address]         Radiology Studies: DG Chest Port 1 View  Result Date: 09/28/2022 CLINICAL DATA:  Cough, wheezing EXAM: PORTABLE CHEST 1 VIEW COMPARISON:  09/27/2022 FINDINGS: Cardiomegaly. Large hiatal hernia. Both lungs are clear. Status post bilateral shoulder arthroplasty. IMPRESSION: 1. Cardiomegaly without acute abnormality of the lungs. 2. Large hiatal hernia. Electronically Signed   By: Delanna Ahmadi M.D.   On: 09/28/2022 11:33        Scheduled Meds:  amLODipine  10 mg Oral q morning   artificial tears   Both Eyes BID   aspirin EC  162 mg Oral QHS   [START ON 10/01/2022] cyanocobalamin  1,000 mcg Oral Q Sun   diclofenac Sodium  2 g Topical QID    enoxaparin (LOVENOX) injection  40 mg Subcutaneous Q24H   feeding supplement  237 mL Oral BID BM   furosemide  20 mg Oral Daily   insulin aspart  0-15 Units Subcutaneous TID WC   ipratropium  0.5 mg Nebulization Q6H   losartan  50 mg Oral Daily   ondansetron (ZOFRAN) IV  4 mg Intravenous Once   pantoprazole  40 mg Oral BID   predniSONE  40 mg Oral Q breakfast   sodium chloride flush  3 mL Intravenous Q12H   Continuous Infusions:  azithromycin 250 mg (09/29/22 1021)     LOS: 0 days    Time spent: 35 minutes    Barb Merino, MD [facility] Pager [phone]

## 2022-10-01 DIAGNOSIS — J441 Chronic obstructive pulmonary disease with (acute) exacerbation: Secondary | ICD-10-CM | POA: Diagnosis not present

## 2022-10-01 LAB — GLUCOSE, CAPILLARY
Glucose-Capillary: 135 mg/dL — ABNORMAL HIGH (ref 70–99)
Glucose-Capillary: 259 mg/dL — ABNORMAL HIGH (ref 70–99)
Glucose-Capillary: 226 mg/dL — ABNORMAL HIGH (ref 70–99)
Glucose-Capillary: 270 mg/dL — ABNORMAL HIGH (ref 70–99)

## 2022-10-01 MED ORDER — IPRATROPIUM BROMIDE 0.02 % IN SOLN
0.5000 mg | Freq: Two times a day (BID) | RESPIRATORY_TRACT | Status: DC
  Administered 2022-10-01: 0.5 mg via RESPIRATORY_TRACT
  Filled 2022-10-01: qty 2.5

## 2022-10-01 MED ORDER — GUAIFENESIN 100 MG/5ML PO LIQD: 10.0000 mL | Freq: Four times a day (QID) | ORAL | 0 refills | Status: DC | PRN

## 2022-10-01 MED ORDER — AZITHROMYCIN 250 MG PO TABS: 250.0000 mg | ORAL_TABLET | Freq: Every day | ORAL | 0 refills | Status: AC

## 2022-10-01 MED ORDER — PREDNISONE 20 MG PO TABS
20.0000 mg | ORAL_TABLET | Freq: Every day | ORAL | Status: DC
  Administered 2022-10-01 – 2022-10-02 (×2): 20 mg via ORAL
  Filled 2022-10-01 (×2): qty 1

## 2022-10-01 MED ORDER — INSULIN ASPART 100 UNIT/ML IJ SOLN
0.0000 [IU] | Freq: Three times a day (TID) | INTRAMUSCULAR | Status: DC
  Administered 2022-10-01: 3 [IU] via SUBCUTANEOUS
  Administered 2022-10-01: 5 [IU] via SUBCUTANEOUS
  Administered 2022-10-01 – 2022-10-02 (×2): 1 [IU] via SUBCUTANEOUS
  Administered 2022-10-02: 3 [IU] via SUBCUTANEOUS

## 2022-10-01 MED ORDER — PREDNISONE 20 MG PO TABS: 20.0000 mg | ORAL_TABLET | Freq: Every day | ORAL | 0 refills | Status: AC

## 2022-10-01 MED ORDER — INSULIN ASPART 100 UNIT/ML IJ SOLN
0.0000 [IU] | Freq: Every day | INTRAMUSCULAR | Status: DC
  Administered 2022-10-01: 3 [IU] via SUBCUTANEOUS

## 2022-10-01 NOTE — Progress Notes (Signed)
PROGRESS NOTE    Heidi Dominguez  [id number] DOB: Mar 16, 1921 DOA: 09/28/2022 PCP: Lajean Manes, MD    Brief Narrative:  89 year old with history of chronic bronchitis, hiatal hernia and dysphagia, hyperlipidemia, peripheral artery disease, GERD and hypertension, CKD stage III lives at a nursing home and bedbound for the last 2 years presented with about 2 months of ongoing cough, wheezing.  Symptoms worse for last 2 weeks.  Treated with multiple therapies as outpatient, doxycycline, recently prescribed steroids and azithromycin(not taken any so far) .  She went to see her pulmonary, on lab showed sodium of 127.  Sent to ER with persistent symptoms.  In the emergency room, diffusely wheezing with intermittent hypoxemia and intermittently desatting to 85% on room air.  Admitted with COPD exacerbation.   Assessment & plan of care:   Acute exacerbation of chronic bronchitis: Acute RSV infection. Treated with oral steroids, scheduled and as needed bronchodilators, deep breathing exercises, incentive spirometry, chest physiotherapy.  Much clinical improvement today. Continue prednisone 20 mg for additional 3 days. Continue azithromycin for 5 days.  2 additional days. Mucolytic's.  Oxygen if needed but currently adequate.  Hiatal hernia with recurrent vomiting and aspiration: Probably exacerbating bronchitis symptoms and cough. Remains on PPI.  She will need all GERD precautions.  Large hiatal hernia, probably causing chronic aspiration. Case discussed with [facility], on-call physician.  Patient currently with RSV and ongoing cough, suggested symptomatic management.  Patient with regurgitation but no dysphagia or odynophagia.  She will not benefit with upper GI endoscopy at this time. Seen by speech therapy, soft diet.  Small frequent meals.  She should be upright while eating all the time.  Essential hypertension, blood pressure stable on amlodipine, losartan and Lasix.  Resume.  CKD stage  IIIb: At about baseline.  Stable.  Steroid-induced hyperglycemia: Patient does have history of steroid-induced hyperglycemia.  Will taper off steroids.  Currently no indication to treat.  Benefits of treating her blood sugars do not outweigh risks of medications.  Goal of care: Detailed discussion with patient's daughter on the phone about patient's CODE STATUS.  She remains full code.  We discussed that performing a CPR in case of cardiac or pulmonary event is likely will produce bad outcome and will be more traumatic.  I suggested patient to be no CPR with full scope of treatment.  Daughter was not able to make any decision as well as patient. Requested palliative care consultation at nursing home.  Medically stable for discharge if nursing home is ready to take her today.   DVT prophylaxis: enoxaparin (LOVENOX) injection 40 mg Start: 09/28/22 1400   Code Status: Full code Family Communication: Daughter on the phone Disposition Plan: Status is: Observation In the process of discharge.   Consultants:  None  Procedures:  None  Antimicrobials:  Azithromycin 2/15---   Subjective:  Patient seen and examined.  She is more comfortable today.  She still has intermittent cough.  Tolerated breakfast well.  Agreeable with plan to go back to nursing home. Discussed with patient's daughter on the phone.  She was worried about nursing home not having adequate staffing today.  Objective: Vitals:   10/01/22 0538 10/01/22 0546 10/01/22 0843 10/01/22 1103  BP:  (!) 154/72  (!) 125/58  Pulse:  79 80 86  Resp:   18 16  Temp:    98 F (36.7 C)  TempSrc:    Oral  SpO2:    (!) 85%  Weight: 77.8 kg  Height:        Intake/Output Summary (Last 24 hours) at 10/01/2022 1113 Last data filed at 10/01/2022 1018 Gross per 24 hour  Intake 443 ml  Output 2750 ml  Net -2307 ml    Filed Weights   09/29/22 0546 09/30/22 0555 10/01/22 0538  Weight: 78.9 kg 79.4 kg 77.8 kg     Examination:  General exam: Patient is comfortable.  Mostly on room air.  Episodic dry cough present.  oriented x 4.  Pleasant to conversation. Respiratory system: Mostly conducted upper airway sounds.  Bronchial breath sounds. Cardiovascular system: S1 & S2 heard, RRR.  Asymmetrical legs.  Left calf bigger than right.  No palpable edema. Gastrointestinal system: Abdomen is nondistended, soft and nontender. No organomegaly or masses felt. Normal bowel sounds heard. Central nervous system: Alert and oriented. No focal neurological deficits.    Data Reviewed: I have personally reviewed following labs and imaging studies  CBC: Recent Labs  Lab 09/27/22 1218 09/28/22 1106 09/29/22 0028  WBC 7.6 7.5 4.4  NEUTROABS 5.8 5.4  --   HGB 12.2 12.4 11.6*  HCT 38.3 38.8 37.6  MCV 87.0 88.6 88.9  PLT 240.0 225 [id number]    Basic Metabolic Panel: Recent Labs  Lab 09/27/22 1218 09/28/22 1106 09/29/22 0028  NA 127* 130* 133*  K 4.0 4.2 4.3  CL 85* 88* 91*  CO2 37* 32 32  GLUCOSE 254* 287* 200*  BUN 10 8 5*  CREATININE 0.53 0.64 0.56  CALCIUM 9.9 9.4 9.1  MG  --  1.8  --     GFR: Estimated Creatinine Clearance: 38.7 mL/min (by C-G formula based on SCr of 0.56 mg/dL). Liver Function Tests: Recent Labs  Lab 09/29/22 0028  AST 14*  ALT 10  ALKPHOS 78  BILITOT 0.3  PROT 7.0  ALBUMIN 2.5*    No results for input(s): "LIPASE", "AMYLASE" in the last 168 hours. No results for input(s): "AMMONIA" in the last 168 hours. Coagulation Profile: No results for input(s): "INR", "PROTIME" in the last 168 hours. Cardiac Enzymes: No results for input(s): "CKTOTAL", "CKMB", "CKMBINDEX", "TROPONINI" in the last 168 hours. BNP (last 3 results) Recent Labs    09/27/22 1218  PROBNP 77.0    HbA1C: No results for input(s): "HGBA1C" in the last 72 hours. CBG: Recent Labs  Lab 09/30/22 0627 09/30/22 1147 09/30/22 1635 09/30/22 2057 10/01/22 0618  GLUCAP 129* 275* 222* 305* 135*     Lipid Profile: No results for input(s): "CHOL", "HDL", "LDLCALC", "TRIG", "CHOLHDL", "LDLDIRECT" in the last 72 hours. Thyroid Function Tests: No results for input(s): "TSH", "T4TOTAL", "FREET4", "T3FREE", "THYROIDAB" in the last 72 hours. Anemia Panel: No results for input(s): "VITAMINB12", "FOLATE", "FERRITIN", "TIBC", "IRON", "RETICCTPCT" in the last 72 hours. Sepsis Labs: No results for input(s): "PROCALCITON", "LATICACIDVEN" in the last 168 hours.  Recent Results (from the past 240 hour(s))  Resp panel by RT-PCR (RSV, Flu A&B, Covid) Anterior Nasal Swab     Status: Abnormal   Collection Time: 09/28/22  1:20 PM   Specimen: Anterior Nasal Swab  Result Value Ref Range Status   SARS Coronavirus 2 by RT PCR NEGATIVE NEGATIVE Final   Influenza A by PCR NEGATIVE NEGATIVE Final   Influenza B by PCR NEGATIVE NEGATIVE Final    Comment: (NOTE) The Xpert Xpress SARS-CoV-2/FLU/RSV plus assay is intended as an aid in the diagnosis of influenza from Nasopharyngeal swab specimens and should not be used as a sole basis for treatment. Nasal washings and aspirates are  unacceptable for Xpert Xpress SARS-CoV-2/FLU/RSV testing.  Fact Sheet for Patients: EntrepreneurPulse.com.au  Fact Sheet for Healthcare Providers: IncredibleEmployment.be  This test is not yet approved or cleared by the [place] FDA and has been authorized for detection and/or diagnosis of SARS-CoV-2 by FDA under an Emergency Use Authorization (EUA). This EUA will remain in effect (meaning this test can be used) for the duration of the COVID-19 declaration under Section 564(b)(1) of the Act, 21 U.S.C. section 360bbb-3(b)(1), unless the authorization is terminated or revoked.     Resp Syncytial Virus by PCR POSITIVE (A) NEGATIVE Final    Comment: (NOTE) Fact Sheet for Patients: EntrepreneurPulse.com.au  Fact Sheet for Healthcare  Providers: IncredibleEmployment.be  This test is not yet approved or cleared by the [place] FDA and has been authorized for detection and/or diagnosis of SARS-CoV-2 by FDA under an Emergency Use Authorization (EUA). This EUA will remain in effect (meaning this test can be used) for the duration of the COVID-19 declaration under Section 564(b)(1) of the Act, 21 U.S.C. section 360bbb-3(b)(1), unless the authorization is terminated or revoked.  Performed at [facility], [place] [address] [place], [place] [address]   Respiratory (~20 pathogens) panel by PCR     Status: Abnormal   Collection Time: 09/28/22  1:20 PM   Specimen: Nasopharyngeal Swab; Respiratory  Result Value Ref Range Status   Adenovirus NOT DETECTED NOT DETECTED Final   Coronavirus 229E NOT DETECTED NOT DETECTED Final    Comment: (NOTE) The Coronavirus on the Respiratory Panel, DOES NOT test for the novel  Coronavirus (2019 nCoV)    Coronavirus HKU1 NOT DETECTED NOT DETECTED Final   Coronavirus NL63 NOT DETECTED NOT DETECTED Final   Coronavirus OC43 NOT DETECTED NOT DETECTED Final   Metapneumovirus NOT DETECTED NOT DETECTED Final   Rhinovirus / Enterovirus NOT DETECTED NOT DETECTED Final   Influenza A NOT DETECTED NOT DETECTED Final   Influenza B NOT DETECTED NOT DETECTED Final   Parainfluenza Virus 1 NOT DETECTED NOT DETECTED Final   Parainfluenza Virus 2 NOT DETECTED NOT DETECTED Final   Parainfluenza Virus 3 NOT DETECTED NOT DETECTED Final   Parainfluenza Virus 4 NOT DETECTED NOT DETECTED Final   Respiratory Syncytial Virus DETECTED (A) NOT DETECTED Final   Bordetella pertussis NOT DETECTED NOT DETECTED Final   Bordetella Parapertussis NOT DETECTED NOT DETECTED Final   Chlamydophila pneumoniae NOT DETECTED NOT DETECTED Final   Mycoplasma pneumoniae NOT DETECTED NOT DETECTED Final    Comment: Performed at [facility], [address] [place], [place] [address]          Radiology Studies: No results found.      Scheduled Meds:  amLODipine  10 mg Oral q morning   artificial tears   Both Eyes BID   aspirin EC  162 mg Oral QHS   azithromycin  250 mg Oral Daily   cyanocobalamin  1,000 mcg Oral Q Sun   diclofenac Sodium  2 g Topical QID   enoxaparin (LOVENOX) injection  40 mg Subcutaneous Q24H   feeding supplement  237 mL Oral BID BM   furosemide  20 mg Oral Daily   insulin aspart  0-5 Units Subcutaneous QHS   insulin aspart  0-9 Units Subcutaneous TID WC   ipratropium  0.5 mg Nebulization BID   losartan  50 mg Oral Daily   ondansetron (ZOFRAN) IV  4 mg Intravenous Once   pantoprazole  40 mg Oral BID   predniSONE  20 mg Oral  Q breakfast   sodium chloride flush  3 mL Intravenous Q12H   Continuous Infusions:     LOS: 0 days    Time spent: 35 minutes    Barb Merino, MD [facility] Pager [phone]

## 2022-10-01 NOTE — Progress Notes (Signed)
CSW spoke with pt's daughter Raymondo Band and verified that pt is a LTC resident at [facility]. CSW spoke about impending DC however it appears that pt may need medication therefore it was decided on early morning DC. CSW alerted [facility] that DC would be tomorrow. [facility] stated that a DC summary was needed.

## 2022-10-01 NOTE — Progress Notes (Signed)
CSW was alerted that pt was medically ready for DC. CSW called facility and had to leave message. Awaiting to hear back from facility.

## 2022-10-02 DIAGNOSIS — J441 Chronic obstructive pulmonary disease with (acute) exacerbation: Secondary | ICD-10-CM | POA: Diagnosis not present

## 2022-10-02 LAB — HEMOGLOBIN A1C
Hgb A1c MFr Bld: 7.5 % — ABNORMAL HIGH (ref 4.8–5.6)
Mean Plasma Glucose: 168.55 mg/dL

## 2022-10-02 LAB — GLUCOSE, CAPILLARY
Glucose-Capillary: 122 mg/dL — ABNORMAL HIGH (ref 70–99)
Glucose-Capillary: 229 mg/dL — ABNORMAL HIGH (ref 70–99)

## 2022-10-02 MED ORDER — BISACODYL 10 MG RE SUPP
10.0000 mg | Freq: Once | RECTAL | Status: DC
  Filled 2022-10-02: qty 1

## 2022-10-02 NOTE — Discharge Instructions (Signed)
Nutrition Post Hospital Stay Proper nutrition can help your body recover from illness and injury.   Foods and beverages high in protein, vitamins, and minerals help rebuild muscle loss, promote healing, & reduce fall risk.   .In addition to eating healthy foods, a nutrition shake is an easy, delicious way to get the nutrition you need during and after your hospital stay  It is recommended that you continue to drink 2 bottles per day of:       Ensure for at least 1 month (30 days) after your hospital stay   Tips for adding a nutrition shake into your routine: As allowed, drink one with vitamins or medications instead of water or juice Enjoy one as a tasty mid-morning or afternoon snack Drink cold or make a milkshake out of it Drink one instead of milk with cereal or snacks Use as a coffee creamer   Available at the following grocery stores and pharmacies:           * [employer] * [employer] * [employer]  * [employer]          * [employer] * [employer]  * Wal[facility]reens      * Target  * BJ's   * [employer]  * [employer]   * [facility] [phone]            For COUPONS visit: [email or web address]/join or [email or web address]   Suggested Substitutions Ensure Plus = Boost Plus = Carnation Breakfast Essentials = Boost Compact Ensure Active Clear = Boost Breeze Glucerna Shake = Boost Glucose Control = Carnation Breakfast Essentials SUGAR FREE       

## 2022-10-02 NOTE — Progress Notes (Signed)
Patient report given to nurse Jana Half at [facility]. Marcille Blanco, RN

## 2022-10-02 NOTE — Discharge Summary (Signed)
Physician Discharge Summary  Heidi Dominguez [id number] DOB: 1921-05-05 DOA: 09/28/2022  PCP: Lajean Manes, MD  Admit date: 09/28/2022 Discharge date: 10/02/2022  Admitted From: Long-term nursing home Disposition: Long-term nursing home  Recommendations for Outpatient Follow-up:  Follow up with PCP in 1-2 weeks Continue to use oxygen as needed to keep saturation more than 90%. Use incentive spirometry, cough medications. Consult palliative care team for goals of care discussion.  Discharge Condition: Stable CODE STATUS: Full code Diet recommendation: Regular diet, dysphagia 3, soft diet.  Aspiration precautions.  Small frequent meals.  Discharge summary: 89 year old with history of chronic bronchitis, hiatal hernia and dysphagia, hyperlipidemia, peripheral artery disease, GERD and hypertension, CKD stage III lives at a nursing home and bedbound for the last 2 years presented with about 2 months of ongoing cough, wheezing.  Symptoms worse for last 2 weeks.  Treated with multiple therapies as outpatient, doxycycline, recently prescribed steroids and azithromycin(not taken any so far) .  She went to see her pulmonary, on lab showed sodium of 127.  Sent to ER with persistent symptoms.  In the emergency room, diffusely wheezing with intermittent hypoxemia and intermittently desatting to 85% on room air.  Admitted with acute exacerbation of chronic bronchitis due to RSV.     Assessment & plan of care:   Acute exacerbation of chronic bronchitis: Acute RSV infection. Treated with oral steroids, scheduled and as needed bronchodilators, deep breathing exercises, incentive spirometry, chest physiotherapy.  Much clinical improvement today. Continue prednisone 20 mg for additional 3 days. Continue azithromycin for 5 days.  2 additional days. Mucolytic's.   She may occasionally use oxygen.  Currently on half liter oxygen.  Keep on oxygen to keep sats more than 90%.   Hiatal hernia with recurrent  vomiting and aspiration: Probably exacerbating bronchitis symptoms and cough. Remains on PPI.  She will need all GERD precautions.  Large hiatal hernia, probably causing chronic aspiration. Case discussed with [facility], on-call physician.  Patient currently with RSV and ongoing cough, suggested symptomatic management.  Patient with regurgitation but no dysphagia or odynophagia.  She will not benefit with upper GI endoscopy at this time. Seen by speech therapy, soft diet.  Small frequent meals.  She should be upright while eating all the time.   Essential hypertension, blood pressure stable on amlodipine, losartan and Lasix.  Resume.   CKD stage IIIb: At about baseline.  Stable.   Diabetes type 2, steroid-induced hyperglycemia: Patient does have history of steroid-induced hyperglycemia.  Will taper off steroids.  Hemoglobin A1c 7.5. Patient has poor oral intake, reflux and GI symptoms.  Benefits of treating her blood sugars do not outweigh risks of medications including previous metformin. I communicated this with the patient's daughter.   Goal of care: Detailed discussion with patient's daughter on the phone about patient's CODE STATUS.  She remains full code.  We discussed that performing a CPR in case of cardiac or pulmonary event is likely will produce bad outcome and will be more traumatic.  I suggested patient to be no CPR with full scope of treatment.  Daughter was not able to make any decision as well as patient. Requested palliative care consultation at nursing home.   Medically stable for discharge if nursing home today.   Discharge Diagnoses:  Active Problems:   Essential hypertension   Diabetes mellitus type 2, controlled (HCC)   Chronic diastolic CHF (congestive heart failure) (HCC)   Bilateral hearing loss   CKD (chronic kidney disease) stage 3, GFR 30-59  ml/min (HCC)   Unspecified chronic bronchitis (HCC)   Gastroesophageal reflux disease   Pure hypercholesterolemia    Thrombocytopenia ([place])   Peripheral artery disease (HCC)   Acute respiratory failure with hypoxia [facility])    Discharge Instructions  Discharge Instructions     Diet - low sodium heart healthy   Complete by: As directed    Frequent small meals, eat upright,  Dysphagia 3 , soft diet   Discharge instructions   Complete by: As directed    Consult palliative care at nursing home   Discharge wound care:   Complete by: As directed    Protect pressure points , barrier dressing   Increase activity slowly   Complete by: As directed    Increase activity slowly   Complete by: As directed    No wound care   Complete by: As directed       Allergies as of 10/02/2022       Reactions   Augmentin [amoxicillin-pot Clavulanate] Diarrhea   Bactrim [sulfamethoxazole-trimethoprim] Diarrhea, Itching   Calan [verapamil] Diarrhea   Catapres [clonidine Hcl] Other (See Comments)   Unknown reaction Not documented on MAR   Codeine Other (See Comments)   "Makes me out of my head, loopy"   Hydrocodone Other (See Comments)   "makes me go out of my head, loopy"   Other Other (See Comments)   Any type of narcotics Feels "loopy"   Sulfa Antibiotics Itching   Zestril [lisinopril] Cough   Azulfidine [sulfasalazine] Itching        Medication List     STOP taking these medications    clopidogrel 75 MG tablet Commonly known as: PLAVIX   doxycycline 100 MG tablet Commonly known as: ADOXA   famotidine 20 MG tablet Commonly known as: PEPCID   lidocaine 5 % ointment Commonly known as: XYLOCAINE   metFORMIN 500 MG tablet Commonly known as: GLUCOPHAGE   multivitamin tablet   potassium chloride 10 MEQ CR capsule Commonly known as: MICRO-K   promethazine-dextromethorphan 6.25-15 MG/5ML syrup Commonly known as: PROMETHAZINE-DM       TAKE these medications    acetaminophen 325 MG tablet Commonly known as: TYLENOL Take 650 mg by mouth 3 (three) times daily.   albuterol 108 (90 Base)  MCG/ACT inhaler Commonly known as: VENTOLIN HFA Inhale 2 puffs into the lungs every 6 (six) hours as needed for wheezing or shortness of breath.   amLODipine 10 MG tablet Commonly known as: NORVASC Take 10 mg by mouth See admin instructions. 10 mg once daily in the morning. HOLD if SBP < 110.   aspirin EC 81 MG tablet Take 162 mg by mouth at bedtime.   azithromycin 250 MG tablet Commonly known as: ZITHROMAX Take 1 tablet (250 mg total) by mouth daily for 2 days. What changed:  how much to take how to take this when to take this additional instructions   furosemide 20 MG tablet Commonly known as: LASIX Take 20 mg by mouth daily.   GenTeal Severe 0.3 % Gel ophthalmic ointment Generic drug: hypromellose Place 1 Application into both eyes 2 (two) times daily.   guaiFENesin 100 MG/5ML liquid Commonly known as: ROBITUSSIN Take 10 mLs by mouth every 6 (six) hours as needed for cough or to loosen phlegm.   ipratropium-albuterol 0.5-2.5 (3) MG/3ML Soln Commonly known as: DUONEB Take 3 mLs by nebulization 3 (three) times daily.   loratadine 10 MG tablet Commonly known as: CLARITIN Take 10 mg by mouth at bedtime.  losartan 50 MG tablet Commonly known as: COZAAR Take 50 mg by mouth See admin instructions. 50 mg once daily in the morning. HOLD for SBP < 110.   Lubricant Eye Drops 0.4-0.3 % Soln Generic drug: Polyethyl Glycol-Propyl Glycol Place 2 drops into both eyes daily.   OMEPRAZOLE PO Take 20 mg by mouth daily. Omeprazole 20 mg DR disintegrating tablet.   ondansetron 4 MG tablet Commonly known as: ZOFRAN Take 4 mg by mouth every 8 (eight) hours as needed for nausea or vomiting.   polyethylene glycol powder 17 GM/SCOOP powder Commonly known as: GLYCOLAX/MIRALAX Take 17 g by mouth at bedtime.   predniSONE 20 MG tablet Commonly known as: DELTASONE Take 1 tablet (20 mg total) by mouth daily with breakfast for 4 days. What changed: how much to take   PreserVision  AREDS Caps Take 1 capsule by mouth daily.   Probiotic Acidophilus Caps Take 1 capsule by mouth at bedtime.   Vitamin D3 25 MCG (1000 UT) capsule Generic drug: Cholecalciferol Take 1,000 Units by mouth every Monday.   Voltaren Arthritis Pain 1 % Gel Generic drug: diclofenac Sodium Apply 2 g topically 3 (three) times daily. To both shoulders.               Discharge Care Instructions  (From admission, onward)           Start     Ordered   10/01/22 0000  Discharge wound care:       Comments: Protect pressure points , barrier dressing   10/01/22 1113            Allergies  Allergen Reactions   Augmentin [Amoxicillin-Pot Clavulanate] Diarrhea   Bactrim [Sulfamethoxazole-Trimethoprim] Diarrhea and Itching   Calan [Verapamil] Diarrhea   Catapres [Clonidine Hcl] Other (See Comments)    Unknown reaction Not documented on MAR    Codeine Other (See Comments)    "Makes me out of my head, loopy"   Hydrocodone Other (See Comments)    "makes me go out of my head, loopy"   Other Other (See Comments)    Any type of narcotics Feels "loopy"    Sulfa Antibiotics Itching   Zestril [Lisinopril] Cough   Azulfidine [Sulfasalazine] Itching    Consultations: None   Procedures/Studies: DG Chest Port 1 View  Result Date: 09/28/2022 CLINICAL DATA:  Cough, wheezing EXAM: PORTABLE CHEST 1 VIEW COMPARISON:  09/27/2022 FINDINGS: Cardiomegaly. Large hiatal hernia. Both lungs are clear. Status post bilateral shoulder arthroplasty. IMPRESSION: 1. Cardiomegaly without acute abnormality of the lungs. 2. Large hiatal hernia. Electronically Signed   By: Delanna Ahmadi M.D.   On: 09/28/2022 11:33   DG Chest 2 View  Result Date: 09/27/2022 CLINICAL DATA:  Wheezing EXAM: CHEST - 2 VIEW COMPARISON:  CT chest dated 03/16/2021 FINDINGS: Lungs are clear.  No pleural effusion or pneumothorax. The heart is normal in size.  Thoracic aortic atherosclerosis. Large hiatal hernia. Mild degenerative  changes of the thoracic spine. Bilateral shoulder arthroplasties. IMPRESSION: Normal chest radiographs. Large hiatal hernia. Electronically Signed   By: Julian Hy M.D.   On: 09/27/2022 12:11   (Echo, Carotid, EGD, Colonoscopy, ERCP)    Subjective: Patient seen and examined.  Dry cough remains.  Denies any nausea or vomiting.  She thinks she is constipated.  Nursing reported she had a bowel movement yesterday however patient did not think so. She will have additional laxatives and suppository today before discharge.   Discharge Exam: Vitals:   10/02/22 0345 10/02/22 0350  BP: (!) 158/83   Pulse: 80 73  Resp: 18   Temp: 98 F (36.7 C)   SpO2: 97% 98%   Vitals:   10/01/22 1930 10/01/22 2043 10/02/22 0345 10/02/22 0350  BP: (!) 105/57  (!) 158/83   Pulse: 90  80 73  Resp: 18  18   Temp: 98 F (36.7 C)  98 F (36.7 C)   TempSrc: Oral  Oral   SpO2: 95% 95% 97% 98%  Weight:   78.9 kg   Height:        General: Pt is alert, awake, not in acute distress Frail debilitated.  She is awake and oriented x 4.  Anxious. Cardiovascular: RRR, S1/S2 +, no rubs, no gallops Respiratory: CTA bilaterally, no wheezing, no rhonchi, mostly conducted upper airway sounds.  Occasional nagging cough. Abdominal: Soft, NT, ND, bowel sounds + Extremities: Nonpitting edema left leg.    The results of significant diagnostics from this hospitalization (including imaging, microbiology, ancillary and laboratory) are listed below for reference.     Microbiology: Recent Results (from the past 240 hour(s))  Resp panel by RT-PCR (RSV, Flu A&B, Covid) Anterior Nasal Swab     Status: Abnormal   Collection Time: 09/28/22  1:20 PM   Specimen: Anterior Nasal Swab  Result Value Ref Range Status   SARS Coronavirus 2 by RT PCR NEGATIVE NEGATIVE Final   Influenza A by PCR NEGATIVE NEGATIVE Final   Influenza B by PCR NEGATIVE NEGATIVE Final    Comment: (NOTE) The Xpert Xpress SARS-CoV-2/FLU/RSV plus assay  is intended as an aid in the diagnosis of influenza from Nasopharyngeal swab specimens and should not be used as a sole basis for treatment. Nasal washings and aspirates are unacceptable for Xpert Xpress SARS-CoV-2/FLU/RSV testing.  Fact Sheet for Patients: EntrepreneurPulse.com.au  Fact Sheet for Healthcare Providers: IncredibleEmployment.be  This test is not yet approved or cleared by the [place] FDA and has been authorized for detection and/or diagnosis of SARS-CoV-2 by FDA under an Emergency Use Authorization (EUA). This EUA will remain in effect (meaning this test can be used) for the duration of the COVID-19 declaration under Section 564(b)(1) of the Act, 21 U.S.C. section 360bbb-3(b)(1), unless the authorization is terminated or revoked.     Resp Syncytial Virus by PCR POSITIVE (A) NEGATIVE Final    Comment: (NOTE) Fact Sheet for Patients: EntrepreneurPulse.com.au  Fact Sheet for Healthcare Providers: IncredibleEmployment.be  This test is not yet approved or cleared by the [place] FDA and has been authorized for detection and/or diagnosis of SARS-CoV-2 by FDA under an Emergency Use Authorization (EUA). This EUA will remain in effect (meaning this test can be used) for the duration of the COVID-19 declaration under Section 564(b)(1) of the Act, 21 U.S.C. section 360bbb-3(b)(1), unless the authorization is terminated or revoked.  Performed at [facility], [place] [address] [place], [place] [address]   Respiratory (~20 pathogens) panel by PCR     Status: Abnormal   Collection Time: 09/28/22  1:20 PM   Specimen: Nasopharyngeal Swab; Respiratory  Result Value Ref Range Status   Adenovirus NOT DETECTED NOT DETECTED Final   Coronavirus 229E NOT DETECTED NOT DETECTED Final    Comment: (NOTE) The Coronavirus on the Respiratory Panel, DOES NOT test for the novel  Coronavirus (2019  nCoV)    Coronavirus HKU1 NOT DETECTED NOT DETECTED Final   Coronavirus NL63 NOT DETECTED NOT DETECTED Final   Coronavirus OC43 NOT DETECTED NOT DETECTED Final   Metapneumovirus NOT  DETECTED NOT DETECTED Final   Rhinovirus / Enterovirus NOT DETECTED NOT DETECTED Final   Influenza A NOT DETECTED NOT DETECTED Final   Influenza B NOT DETECTED NOT DETECTED Final   Parainfluenza Virus 1 NOT DETECTED NOT DETECTED Final   Parainfluenza Virus 2 NOT DETECTED NOT DETECTED Final   Parainfluenza Virus 3 NOT DETECTED NOT DETECTED Final   Parainfluenza Virus 4 NOT DETECTED NOT DETECTED Final   Respiratory Syncytial Virus DETECTED (A) NOT DETECTED Final   Bordetella pertussis NOT DETECTED NOT DETECTED Final   Bordetella Parapertussis NOT DETECTED NOT DETECTED Final   Chlamydophila pneumoniae NOT DETECTED NOT DETECTED Final   Mycoplasma pneumoniae NOT DETECTED NOT DETECTED Final    Comment: Performed at [facility], [place] [address], [place], [place] [address]     Labs: BNP (last 3 results) No results for input(s): "BNP" in the last 8760 hours. Basic Metabolic Panel: Recent Labs  Lab 09/27/22 1218 09/28/22 1106 09/29/22 0028  NA 127* 130* 133*  K 4.0 4.2 4.3  CL 85* 88* 91*  CO2 37* 32 32  GLUCOSE 254* 287* 200*  BUN 10 8 5*  CREATININE 0.53 0.64 0.56  CALCIUM 9.9 9.4 9.1  MG  --  1.8  --    Liver Function Tests: Recent Labs  Lab 09/29/22 0028  AST 14*  ALT 10  ALKPHOS 78  BILITOT 0.3  PROT 7.0  ALBUMIN 2.5*   No results for input(s): "LIPASE", "AMYLASE" in the last 168 hours. No results for input(s): "AMMONIA" in the last 168 hours. CBC: Recent Labs  Lab 09/27/22 1218 09/28/22 1106 09/29/22 0028  WBC 7.6 7.5 4.4  NEUTROABS 5.8 5.4  --   HGB 12.2 12.4 11.6*  HCT 38.3 38.8 37.6  MCV 87.0 88.6 88.9  PLT 240.0 225 221   Cardiac Enzymes: No results for input(s): "CKTOTAL", "CKMB", "CKMBINDEX", "TROPONINI" in the last 168 hours. BNP: Invalid input(s):  "POCBNP" CBG: Recent Labs  Lab 10/01/22 0618 10/01/22 1224 10/01/22 1633 10/01/22 2056 10/02/22 0609  GLUCAP 135* 259* 226* 270* 122*   D-Dimer No results for input(s): "DDIMER" in the last 72 hours. Hgb A1c Recent Labs    10/02/22 0057  HGBA1C 7.5*   Lipid Profile No results for input(s): "CHOL", "HDL", "LDLCALC", "TRIG", "CHOLHDL", "LDLDIRECT" in the last 72 hours. Thyroid function studies No results for input(s): "TSH", "T4TOTAL", "T3FREE", "THYROIDAB" in the last 72 hours.  Invalid input(s): "FREET3" Anemia work up No results for input(s): "VITAMINB12", "FOLATE", "FERRITIN", "TIBC", "IRON", "RETICCTPCT" in the last 72 hours. Urinalysis    Component Value Date/Time   COLORURINE YELLOW 11/02/2019 2013   APPEARANCEUR HAZY (A) 11/02/2019 2013   LABSPEC 1.015 11/02/2019 2013   PHURINE 5.0 11/02/2019 2013   GLUCOSEU NEGATIVE 11/02/2019 2013   HGBUR NEGATIVE 11/02/2019 2013   BILIRUBINUR NEGATIVE 11/02/2019 2013   [place] 11/02/2019 2013   PROTEINUR NEGATIVE 11/02/2019 2013   UROBILINOGEN 1.0 04/07/2015 2312   NITRITE NEGATIVE 11/02/2019 2013   LEUKOCYTESUR LARGE (A) 11/02/2019 2013   Sepsis Labs Recent Labs  Lab 09/27/22 1218 09/28/22 1106 09/29/22 0028  WBC 7.6 7.5 4.4   Microbiology Recent Results (from the past 240 hour(s))  Resp panel by RT-PCR (RSV, Flu A&B, Covid) Anterior Nasal Swab     Status: Abnormal   Collection Time: 09/28/22  1:20 PM   Specimen: Anterior Nasal Swab  Result Value Ref Range Status   SARS Coronavirus 2 by RT PCR NEGATIVE NEGATIVE Final   Influenza A  by PCR NEGATIVE NEGATIVE Final   Influenza B by PCR NEGATIVE NEGATIVE Final    Comment: (NOTE) The Xpert Xpress SARS-CoV-2/FLU/RSV plus assay is intended as an aid in the diagnosis of influenza from Nasopharyngeal swab specimens and should not be used as a sole basis for treatment. Nasal washings and aspirates are unacceptable for Xpert Xpress  SARS-CoV-2/FLU/RSV testing.  Fact Sheet for Patients: EntrepreneurPulse.com.au  Fact Sheet for Healthcare Providers: IncredibleEmployment.be  This test is not yet approved or cleared by the [place] FDA and has been authorized for detection and/or diagnosis of SARS-CoV-2 by FDA under an Emergency Use Authorization (EUA). This EUA will remain in effect (meaning this test can be used) for the duration of the COVID-19 declaration under Section 564(b)(1) of the Act, 21 U.S.C. section 360bbb-3(b)(1), unless the authorization is terminated or revoked.     Resp Syncytial Virus by PCR POSITIVE (A) NEGATIVE Final    Comment: (NOTE) Fact Sheet for Patients: EntrepreneurPulse.com.au  Fact Sheet for Healthcare Providers: IncredibleEmployment.be  This test is not yet approved or cleared by the [place] FDA and has been authorized for detection and/or diagnosis of SARS-CoV-2 by FDA under an Emergency Use Authorization (EUA). This EUA will remain in effect (meaning this test can be used) for the duration of the COVID-19 declaration under Section 564(b)(1) of the Act, 21 U.S.C. section 360bbb-3(b)(1), unless the authorization is terminated or revoked.  Performed at [facility], [place] [address], [place], [place] [address]   Respiratory (~20 pathogens) panel by PCR     Status: Abnormal   Collection Time: 09/28/22  1:20 PM   Specimen: Nasopharyngeal Swab; Respiratory  Result Value Ref Range Status   Adenovirus NOT DETECTED NOT DETECTED Final   Coronavirus 229E NOT DETECTED NOT DETECTED Final    Comment: (NOTE) The Coronavirus on the Respiratory Panel, DOES NOT test for the novel  Coronavirus (2019 nCoV)    Coronavirus HKU1 NOT DETECTED NOT DETECTED Final   Coronavirus NL63 NOT DETECTED NOT DETECTED Final   Coronavirus OC43 NOT DETECTED NOT DETECTED Final   Metapneumovirus NOT DETECTED NOT DETECTED  Final   Rhinovirus / Enterovirus NOT DETECTED NOT DETECTED Final   Influenza A NOT DETECTED NOT DETECTED Final   Influenza B NOT DETECTED NOT DETECTED Final   Parainfluenza Virus 1 NOT DETECTED NOT DETECTED Final   Parainfluenza Virus 2 NOT DETECTED NOT DETECTED Final   Parainfluenza Virus 3 NOT DETECTED NOT DETECTED Final   Parainfluenza Virus 4 NOT DETECTED NOT DETECTED Final   Respiratory Syncytial Virus DETECTED (A) NOT DETECTED Final   Bordetella pertussis NOT DETECTED NOT DETECTED Final   Bordetella Parapertussis NOT DETECTED NOT DETECTED Final   Chlamydophila pneumoniae NOT DETECTED NOT DETECTED Final   Mycoplasma pneumoniae NOT DETECTED NOT DETECTED Final    Comment: Performed at [facility], [address] [place], [place] [address]     Time coordinating discharge: 35 minutes  SIGNED:   Barb Merino, MD  [facility] 10/02/2022, 10:02 AM

## 2022-10-02 NOTE — TOC Transition Note (Signed)
Transition of Care [facility]) - CM/SW Discharge Note   Patient Details  Name: Heidi Dominguez MRN: [id number] Date of Birth: [birth year]  [facility] of [facility]) CM/SW Contact:  Bjorn Pippin, LCSW Phone Number: 10/02/2022, 11:30 AM   Clinical Narrative:    Patient will DC to: [facility]  Anticipated DC date: 10/02/2022 Family notified: Daughter Loss adjuster, chartered by: Corey Harold   Per MD patient ready for DC to [facility]. RN, patient, patient's family, and facility notified of DC. Discharge Summary and FL2 sent to facility. RN to call report prior to discharge [phone]. DC packet on chart. Ambulance transport requested for patient.   CSW will sign off for now as social work intervention is no longer needed. Please consult [place] again if new needs arise.   Final next level of care: Skilled Nursing Facility Barriers to Discharge: No Barriers Identified   Patient Goals and CMS Choice      Discharge Placement                Patient chooses bed at: [facility] Patient to be transferred to facility by: [facility] Name of family member notified: [name] (daughter) Patient and family notified of of transfer: 10/02/22  Discharge Plan and Services Additional resources added to the After Visit Summary for                                       Social Determinants of Health (SDOH) Interventions SDOH Screenings   Food Insecurity: No Food Insecurity (09/28/2022)  Housing: Low Risk  (09/28/2022)  Transportation Needs: No Transportation Needs (09/28/2022)  Utilities: Not At Risk (09/28/2022)  Depression (PHQ2-9): Low Risk  (02/09/2021)  Tobacco Use: Low Risk  (09/28/2022)     Readmission Risk Interventions     No data to display          Beckey Rutter, MSW, LCSWA, LCASA Transitions of Care  Clinical Social Worker I

## 2022-10-11 ENCOUNTER — Ambulatory Visit: Payer: Medicare PPO | Admitting: Nurse Practitioner

## 2022-10-16 ENCOUNTER — Ambulatory Visit (INDEPENDENT_AMBULATORY_CARE_PROVIDER_SITE_OTHER): Payer: Medicare PPO | Admitting: Nurse Practitioner

## 2022-10-16 ENCOUNTER — Encounter: Payer: Self-pay | Admitting: Nurse Practitioner

## 2022-10-16 VITALS — BP 124/74 | HR 84 | Ht 64.0 in | Wt 172.4 lb

## 2022-10-16 DIAGNOSIS — J9601 Acute respiratory failure with hypoxia: Secondary | ICD-10-CM

## 2022-10-16 DIAGNOSIS — J418 Mixed simple and mucopurulent chronic bronchitis: Secondary | ICD-10-CM | POA: Diagnosis not present

## 2022-10-16 DIAGNOSIS — J4530 Mild persistent asthma, uncomplicated: Secondary | ICD-10-CM | POA: Diagnosis not present

## 2022-10-16 DIAGNOSIS — J45909 Unspecified asthma, uncomplicated: Secondary | ICD-10-CM | POA: Insufficient documentation

## 2022-10-16 MED ORDER — DM-GUAIFENESIN ER 30-600 MG PO TB12: 1.0000 | ORAL_TABLET | Freq: Two times a day (BID) | ORAL | 5 refills | Status: DC

## 2022-10-16 MED ORDER — BUDESONIDE 0.5 MG/2ML IN SUSP: 0.5000 mg | Freq: Two times a day (BID) | RESPIRATORY_TRACT | 3 refills | Status: DC

## 2022-10-16 NOTE — Assessment & Plan Note (Signed)
Chronic bronchitis with recent exacerbation. She has a reactive airway component. See above.

## 2022-10-16 NOTE — Assessment & Plan Note (Signed)
Clinically improved post hospitalization; still with bronchospasm on exam and chronic cough. She improved with previous steroid course. We will start her on regimen with nebulized ICS and continue her duonebs twice daily for maintenance and to hopefully, reduce future exacerbations. Restart mucolytics and cough control with Mucinex DM. Continued GERD/aspiration precautions. Action plan in place.  Patient Instructions  Continue Albuterol inhaler 2 puffs or duoneb 3 mL neb every 6 hours as needed for shortness of breath or wheezing. Notify if symptoms persist despite rescue inhaler/neb use. Use duoneb twice daily with budesonide nebs.  Continue benzonatate 1 capsule Three times a day as needed for cough Continue loratadine 1 tablet  Continue famotidine 1 tab daily Continue pantoprazole 1 tab twice daily to complete 6 weeks then decrease to 1 tab daily in AM. Elevate head of bed at least 30 degrees at night.   Start Mucinex DM 1 tab Twice daily for cough/congestion Start budesonide neb 2 mL twice daily. Brush tongue and rinse mouth afterwards.    Follow up in 6 weeks with new pulmonologist (30 min consult slot) or Katie Ikey Omary,NP if no availability to see how new nebulizer is going. Ok for video visit with [employer]. If symptoms do not improve or worsen, please contact office for sooner follow up or seek emergency care.

## 2022-10-16 NOTE — Patient Instructions (Addendum)
Continue Albuterol inhaler 2 puffs or duoneb 3 mL neb every 6 hours as needed for shortness of breath or wheezing. Notify if symptoms persist despite rescue inhaler/neb use. Use duoneb twice daily with budesonide nebs.  Continue benzonatate 1 capsule Three times a day as needed for cough Continue loratadine 1 tablet  Continue famotidine 1 tab daily Continue pantoprazole 1 tab twice daily to complete 6 weeks then decrease to 1 tab daily in AM. Elevate head of bed at least 30 degrees at night.   Start Mucinex DM 1 tab Twice daily for cough/congestion Start budesonide neb 2 mL twice daily. Brush tongue and rinse mouth afterwards.    Follow up in 6 weeks with new pulmonologist (30 min consult slot) or Katie Emmersyn Kratzke,NP if no availability to see how new nebulizer is going. Ok for video visit with [place]. If symptoms do not improve or worsen, please contact office for sooner follow up or seek emergency care.

## 2022-10-16 NOTE — Assessment & Plan Note (Signed)
Able to maintain on room air at [facility]. Advised to continue monitoring and apply supplemental oxygen as needed to maintain >88-90%

## 2022-10-16 NOTE — Progress Notes (Signed)
$'@Patient'S$  ID: Heidi Dominguez, female    DOB: 06/19/1921, 89 y.o.   MRN: [id number]  Chief Complaint  Patient presents with   Follow-up    [name] states that her breathing is some better, Heidi Dominguez states that she is still coughing and after she eats she has a wheeze.     Referring provider: Lajean Manes, MD  HPI: 89 year old female, never smoker followed for upper airway cough. She was previously followed by Dr. Melvyn Dominguez (last seen 2020) and last seen in office 09/27/2022 by Heidi Dominguez. Past medical history significant for HTN, CHF, PAD, GERD, DM II, CKD stage 3, HLD.  TEST/EVENTS:  03/16/2021 CTA chest:no PE. Stable thyroid nodule. bandlike scarring at lung apices R>L. Mild dependent atelectasis in both lungs and LLL. Large hiatal hernia.  07/20/2022: laryngeal penetration; mild aspiration risk; GERD precautions advised 09/28/2022 CXR: cardiomegaly without acute abnormality. Large hiatal hernia  09/15/2019: OV with [place],Dominguez. Recommended to d/c lisinopril at previous visit. She was started on losartan for BP control. Doing much better. Cough substantially decreased. Still has some nasal congestion/drainage. Overall improved.   09/27/2022: OV with Heidi Ellwood Dominguez for acute visit with [name]. She has been having trouble with a cough and wheezing for going on two months now. She tells me that she will sometimes get some phlegm up, which is white. She gets a little short of breath with coughing spells but otherwise breathing feels stable. She had a chest x ray around 4-5 weeks ago with her PCP which they were told did not show any pneumonia. They did treat her with doxycycline course and started her on neb treatments but she didn't have much change. She denies hemoptysis, leg swelling, calf pain, orthopnea, fevers, chills. Eating and drinking normally. [name] is not sure how often they are giving her breathing treatments at her facility.  She's also been having trouble with eating and getting  choked/spitting up clear phlegm with this so she went for a modified barium swallow study ordered by her GI physician. This was completed in December but they're not sure what the results were. Per results in her chart, no obvious signs of aspiration but she did have some laryngeal penetration. She was advised on GERD precautions, especially at night. [name] tells me that her GI provider has been debating whether or not to do an EGD for further evaluation. She does have a known large hiatal hernia.   10/16/2022: Today - follow up Patient presents today for follow up with [name].  After her last visit, she was found to have low sodium level of 127 and was instructed to go to the emergency room for further evaluation.  She ended up being admitted on 2/15 as she was noted to be hypoxic upon presentation to the emergency room.  She was admitted until 2/19 for acute exacerbation of chronic bronchitis secondary to acute RSV infection.  She was treated with steroids and bronchodilators.  She was discharged on half a liter of supplemental oxygen to her facility.  Sodium improved to 133 upon discharge.  GI was also consulted during her visit due to large hiatal hernia and likely chronic aspiration contributing to her symptoms.  They did not feel she would benefit from upper GI at this time.  She was evaluated by speech therapy who encouraged soft diet, small frequent meals and GERD precautions. Today, she tells me that she is feeling much better.  She feels like she is breathing better and  not wheezing as much.  She still has a cough throughout the day, which is occasionally productive with clear sputum.  Seems to be close to her baseline.  [name] says that she still wheezes some after eating.  She is at a facility but she is doing her best to make sure that she stays upright after eating.  They have also decreased her from using her DuoNebs 4 times a day to 2 times a day.  The Heidi Dominguez is concerned  because the provider at the facility wants to have her off of these entirely.  She feels like this is not a great idea and would like her input on this.  She denies any fevers, chills, hemoptysis, increased lower extremity swelling, palpitations, orthopnea.  Allergies  Allergen Reactions   Augmentin [Amoxicillin-Pot Clavulanate] Diarrhea   Bactrim [Sulfamethoxazole-Trimethoprim] Diarrhea and Itching   Calan [Verapamil] Diarrhea   Catapres [Clonidine Hcl] Other (See Comments)    Unknown reaction Not documented on MAR    Codeine Other (See Comments)    "Makes me out of my head, loopy"   Hydrocodone Other (See Comments)    "makes me go out of my head, loopy"   Other Other (See Comments)    Any type of narcotics Feels "loopy"    Sulfa Antibiotics Itching   Zestril [Lisinopril] Cough   Azulfidine [Sulfasalazine] Itching    Immunization History  Administered Date(s) Administered   Influenza Split 06/02/2010, 06/29/2011, 05/17/2012, 05/28/2013, 05/27/2014, 06/10/2015   Influenza, High Dose Seasonal PF 05/24/2016, 05/02/2017, 06/25/2018, 07/14/2020   Influenza-Unspecified 07/31/2019   PFIZER(Purple Top)SARS-COV-2 Vaccination 09/24/2019, 10/05/2019, 10/29/2019, 09/14/2020   PPD Test 04/13/2015   Pneumococcal Conjugate-13 11/18/2013   Pneumococcal Polysaccharide-23 08/30/2000, 11/15/2012   Tdap 05/17/2012    Past Medical History:  Diagnosis Date   Acute pancreatitis    hx of    Arthritis    "qwhere" (07/04/2018)   Chronic back pain    "all over" (07/04/2018)   Chronic diastolic CHF (congestive heart failure) ([place])    Chronic kidney disease    stage  3 chronic kidney disease    Complication of anesthesia    "I have a hard time waking up"   Degenerative joint disease of shoulder region    Dry eye syndrome    Dyspnea    Family history of adverse reaction to anesthesia    "Heidi Dominguez has the shakes when she wakes up"   GERD (gastroesophageal reflux disease)    Heart murmur     High cholesterol    History of hiatal hernia    HOH (hard of hearing)    Hypertension    Impacted cerumen of both ears    hx of    Muscle weakness (generalized)    Osteoarthritis    Overactive bladder    Pancreatitis    hx of    Phlebitis    "BLE"   Rotator cuff tear    right    Tear of right supraspinatus tendon    Thrombocytopenia (HCC)    hx of    Type II diabetes mellitus ([place])    Urinary tract infection    hx of    UTI (urinary tract infection) 09/12/2018   Xerosis cutis    hx of     Tobacco History: Social History   Tobacco Use  Smoking Status Never  Smokeless Tobacco Never   Counseling given: Not Answered   Outpatient Medications Prior to Visit  Medication Sig Dispense Refill   acetaminophen (TYLENOL) 325  MG tablet Take 650 mg by mouth 3 (three) times daily.     albuterol (VENTOLIN HFA) 108 (90 Base) MCG/ACT inhaler Inhale 2 puffs into the lungs every 6 (six) hours as needed for wheezing or shortness of breath.     amLODipine (NORVASC) 10 MG tablet Take 10 mg by mouth See admin instructions. 10 mg once daily in the morning. HOLD if SBP < 110.     aspirin EC 81 MG tablet Take 162 mg by mouth at bedtime.     Cholecalciferol (VITAMIN D3) 25 MCG (1000 UT) capsule Take 1,000 Units by mouth every Monday.     diclofenac Sodium (VOLTAREN ARTHRITIS PAIN) 1 % GEL Apply 2 g topically 3 (three) times daily. To both shoulders.     furosemide (LASIX) 20 MG tablet Take 20 mg by mouth daily.     guaiFENesin (ROBITUSSIN) 100 MG/5ML liquid Take 10 mLs by mouth every 6 (six) hours as needed for cough or to loosen phlegm. 120 mL 0   hydrochlorothiazide (HYDRODIURIL) 25 MG tablet Take 25 mg by mouth daily.     hypromellose (GENTEAL SEVERE) 0.3 % GEL ophthalmic ointment Place 1 Application into both eyes 2 (two) times daily.     ipratropium-albuterol (DUONEB) 0.5-2.5 (3) MG/3ML SOLN Take 3 mLs by nebulization 3 (three) times daily.     Lactobacillus (PROBIOTIC ACIDOPHILUS) CAPS Take  1 capsule by mouth at bedtime.     loratadine (CLARITIN) 10 MG tablet Take 10 mg by mouth at bedtime.     losartan (COZAAR) 50 MG tablet Take 50 mg by mouth See admin instructions. 50 mg once daily in the morning. HOLD for SBP < 110.     Multiple Vitamins-Minerals (PRESERVISION AREDS) CAPS Take 1 capsule by mouth daily.     OMEPRAZOLE PO Take 20 mg by mouth daily. Omeprazole 20 mg DR disintegrating tablet.     ondansetron (ZOFRAN) 4 MG tablet Take 4 mg by mouth every 8 (eight) hours as needed for nausea or vomiting.     Polyethyl Glycol-Propyl Glycol (LUBRICANT EYE DROPS) 0.4-0.3 % SOLN Place 2 drops into both eyes daily.     polyethylene glycol powder (GLYCOLAX/MIRALAX) 17 GM/SCOOP powder Take 17 g by mouth at bedtime.     Facility-Administered Medications Prior to Visit  Medication Dose Route Frequency Provider Last Rate Last Admin   ipratropium-albuterol (DUONEB) 0.5-2.5 (3) MG/3ML nebulizer solution 3 mL  3 mL Nebulization Q6H PRN Jaleil Renwick, Karie Schwalbe, Dominguez         Review of Systems:   Constitutional: No weight loss or gain, night sweats, fevers, chills. +fatigue, lassitude (baseline) HEENT: No headaches, tooth/dental problems, or sore throat. No sneezing, itching, ear ache, nasal congestion, or post nasal drip. +difficulty swallowing CV:  No chest pain, orthopnea, PND, swelling in lower extremities, anasarca, dizziness, palpitations, syncope Resp: +chronic cough (baseline); occasional wheeze. No shortness of breath with exertion or at rest. No excess mucus or change in color of mucus. No hemoptysis.  No chest wall deformity GI:  +heartburn, indigestion. No abdominal pain, nausea, vomiting, diarrhea, change in bowel habits, loss of appetite, bloody stools.  GU: No dysuria, change in color of urine, urgency or frequency.   Skin: No rash, lesions, ulcerations MSK:  No joint pain or swelling.  Neuro: No dizziness or lightheadedness.  Psych: No depression or anxiety. Mood stable.      Physical Exam:  BP 124/74   Pulse 84   Ht '5\' 4"'$  (1.626 m)   Wt 172 lb  6.4 oz (78.2 kg)   SpO2 93%   BMI 29.59 kg/m   GEN: Pleasant, interactive, well-kempt; elderly; in no acute distress HEENT:  Normocephalic and atraumatic. PERRLA. Sclera white. Nasal turbinates pink, moist and patent bilaterally. No rhinorrhea present. Oropharynx pink and moist, without exudate or edema. No lesions, ulcerations, or postnasal drip.  NECK:  Supple w/ fair ROM. No JVD present. Normal carotid impulses w/o bruits. Thyroid symmetrical with no goiter or nodules palpated. No lymphadenopathy.   CV: RRR, no m/r/g, dependent BLE edema (baseline). Pulses intact, +2 bilaterally. No cyanosis, pallor or clubbing. PULMONARY:  Unlabored, regular breathing. Scattered end expiratory wheezes bilaterally A&P. No accessory muscle use.  GI: BS present and normoactive. Soft, non-tender to palpation. No organomegaly or masses detected.  MSK: No erythema, warmth or tenderness. Cap refil <2 sec all extrem. No deformities or joint swelling noted.  Neuro: A/Ox3. No focal deficits noted.   Skin: Warm, no lesions or rashe Psych: Normal affect and behavior. Judgement and thought content appropriate.     Lab Results:  CBC    Component Value Date/Time   WBC 4.4 09/29/2022 0028   RBC 4.23 09/29/2022 0028   HGB 11.6 (L) 09/29/2022 0028   HCT 37.6 09/29/2022 0028   PLT 221 09/29/2022 0028   MCV 88.9 09/29/2022 0028   MCH 27.4 09/29/2022 0028   MCHC 30.9 09/29/2022 0028   RDW 14.7 09/29/2022 0028   LYMPHSABS 1.3 09/28/2022 1106   MONOABS 0.6 09/28/2022 1106   EOSABS 0.1 09/28/2022 1106   BASOSABS 0.0 09/28/2022 1106    BMET    Component Value Date/Time   NA 133 (L) 09/29/2022 0028   K 4.3 09/29/2022 0028   CL 91 (L) 09/29/2022 0028   CO2 32 09/29/2022 0028   GLUCOSE 200 (H) 09/29/2022 0028   BUN 5 (L) 09/29/2022 0028   CREATININE 0.56 09/29/2022 0028   CREATININE 0.81 02/09/2021 1641   CALCIUM 9.1  09/29/2022 0028   GFRNONAA >60 09/29/2022 0028   GFRNONAA 39 (L) 01/12/2021 0000   GFRAA 45 (L) 01/12/2021 0000    BNP    Component Value Date/Time   BNP 36.2 03/14/2021 1425     Imaging:          No data to display          No results found for: "NITRICOXIDE"      Assessment & Plan:   Reactive airway disease Clinically improved post hospitalization; still with bronchospasm on exam and chronic cough. She improved with previous steroid course. We will start her on regimen with nebulized ICS and continue her duonebs twice daily for maintenance and to hopefully, reduce future exacerbations. Restart mucolytics and cough control with Mucinex DM. Continued GERD/aspiration precautions. Action plan in place.  Patient Instructions  Continue Albuterol inhaler 2 puffs or duoneb 3 mL neb every 6 hours as needed for shortness of breath or wheezing. Notify if symptoms persist despite rescue inhaler/neb use. Use duoneb twice daily with budesonide nebs.  Continue benzonatate 1 capsule Three times a day as needed for cough Continue loratadine 1 tablet  Continue famotidine 1 tab daily Continue pantoprazole 1 tab twice daily to complete 6 weeks then decrease to 1 tab daily in AM. Elevate head of bed at least 30 degrees at night.   Start Mucinex DM 1 tab Twice daily for cough/congestion Start budesonide neb 2 mL twice daily. Brush tongue and rinse mouth afterwards.    Follow up in 6 weeks with Heidi pulmonologist (30  min consult slot) or Katie Irine Heminger,Dominguez if no availability to see how Heidi nebulizer is going. Ok for video visit with [place]. If symptoms do not improve or worsen, please contact office for sooner follow up or seek emergency care.    Acute respiratory failure with hypoxia ([place]) Able to maintain on room air at [facility]. Advised to continue monitoring and apply supplemental oxygen as needed to maintain >88-90%  Bronchitis Chronic bronchitis with recent exacerbation. She has a reactive  airway component. See above.    I spent 35 minutes of dedicated to the care of this patient on the date of this encounter to include pre-visit review of records, face-to-face time with the patient discussing conditions above, post visit ordering of testing, clinical documentation with the electronic health record, making appropriate referrals as documented, and communicating necessary findings to members of the patients care team.  Clayton Bibles, Dominguez 10/16/2022  Pt aware and understands Dominguez's role.

## 2022-10-25 ENCOUNTER — Other Ambulatory Visit: Payer: Self-pay

## 2022-10-25 ENCOUNTER — Emergency Department (HOSPITAL_COMMUNITY)
Admission: EM | Admit: 2022-10-25 | Discharge: 2022-10-26 | Disposition: A | Discharge status: Home / Self Care | Payer: Medicare PPO | Attending: Emergency Medicine | Admitting: Emergency Medicine

## 2022-10-25 ENCOUNTER — Encounter (HOSPITAL_COMMUNITY): Payer: Self-pay | Admitting: Emergency Medicine

## 2022-10-25 ENCOUNTER — Emergency Department (HOSPITAL_COMMUNITY): Payer: Medicare PPO

## 2022-10-25 DIAGNOSIS — E1165 Type 2 diabetes mellitus with hyperglycemia: Secondary | ICD-10-CM | POA: Insufficient documentation

## 2022-10-25 DIAGNOSIS — I110 Hypertensive heart disease with heart failure: Secondary | ICD-10-CM | POA: Insufficient documentation

## 2022-10-25 DIAGNOSIS — R600 Localized edema: Secondary | ICD-10-CM | POA: Insufficient documentation

## 2022-10-25 DIAGNOSIS — Z79899 Other long term (current) drug therapy: Secondary | ICD-10-CM | POA: Insufficient documentation

## 2022-10-25 DIAGNOSIS — D72829 Elevated white blood cell count, unspecified: Secondary | ICD-10-CM | POA: Diagnosis not present

## 2022-10-25 DIAGNOSIS — M7989 Other specified soft tissue disorders: Secondary | ICD-10-CM | POA: Insufficient documentation

## 2022-10-25 DIAGNOSIS — R609 Edema, unspecified: Secondary | ICD-10-CM

## 2022-10-25 DIAGNOSIS — I5030 Unspecified diastolic (congestive) heart failure: Secondary | ICD-10-CM | POA: Diagnosis not present

## 2022-10-25 DIAGNOSIS — Z7982 Long term (current) use of aspirin: Secondary | ICD-10-CM | POA: Insufficient documentation

## 2022-10-25 DIAGNOSIS — Z7984 Long term (current) use of oral hypoglycemic drugs: Secondary | ICD-10-CM | POA: Insufficient documentation

## 2022-10-25 DIAGNOSIS — R739 Hyperglycemia, unspecified: Secondary | ICD-10-CM

## 2022-10-25 LAB — CBC WITH DIFFERENTIAL/PLATELET
Abs Immature Granulocytes: 0.13 10*3/uL — ABNORMAL HIGH (ref 0.00–0.07)
Basophils Absolute: 0 10*3/uL (ref 0.0–0.1)
Basophils Relative: 0 %
Eosinophils Absolute: 0.1 10*3/uL (ref 0.0–0.5)
Eosinophils Relative: 1 %
HCT: 35.7 % — ABNORMAL LOW (ref 36.0–46.0)
Hemoglobin: 11.4 g/dL — ABNORMAL LOW (ref 12.0–15.0)
Immature Granulocytes: 1 %
Lymphocytes Relative: 5 %
Lymphs Abs: 0.7 10*3/uL (ref 0.7–4.0)
MCH: 28.1 pg (ref 26.0–34.0)
MCHC: 31.9 g/dL (ref 30.0–36.0)
MCV: 87.9 fL (ref 80.0–100.0)
Monocytes Absolute: 1.8 10*3/uL — ABNORMAL HIGH (ref 0.1–1.0)
Monocytes Relative: 13 %
Neutro Abs: 10.4 10*3/uL — ABNORMAL HIGH (ref 1.7–7.7)
Neutrophils Relative %: 80 %
Platelets: 228 10*3/uL (ref 150–400)
RBC: 4.06 MIL/uL (ref 3.87–5.11)
RDW: 14.7 % (ref 11.5–15.5)
WBC: 13.1 10*3/uL — ABNORMAL HIGH (ref 4.0–10.5)
nRBC: 0 % (ref 0.0–0.2)

## 2022-10-25 LAB — I-STAT VENOUS BLOOD GAS, ED
Acid-Base Excess: 11 mmol/L — ABNORMAL HIGH (ref 0.0–2.0)
Acid-Base Excess: 8 mmol/L — ABNORMAL HIGH (ref 0.0–2.0)
Bicarbonate: 29.3 mmol/L — ABNORMAL HIGH (ref 20.0–28.0)
Bicarbonate: 33.9 mmol/L — ABNORMAL HIGH (ref 20.0–28.0)
Calcium, Ion: 0.69 mmol/L — CL (ref 1.15–1.40)
Calcium, Ion: 1.23 mmol/L (ref 1.15–1.40)
HCT: 37 % (ref 36.0–46.0)
HCT: 37 % (ref 36.0–46.0)
Hemoglobin: 12.6 g/dL (ref 12.0–15.0)
Hemoglobin: 12.6 g/dL (ref 12.0–15.0)
O2 Saturation: 100 %
O2 Saturation: 78 %
Potassium: 3.4 mmol/L — ABNORMAL LOW (ref 3.5–5.1)
Potassium: 4.2 mmol/L (ref 3.5–5.1)
Sodium: 116 mmol/L — CL (ref 135–145)
Sodium: 124 mmol/L — ABNORMAL LOW (ref 135–145)
TCO2: 30 mmol/L (ref 22–32)
TCO2: 35 mmol/L — ABNORMAL HIGH (ref 22–32)
pCO2, Ven: 21.1 mmHg — ABNORMAL LOW (ref 44–60)
pCO2, Ven: 51.8 mmHg (ref 44–60)
pH, Ven: 7.424 (ref 7.25–7.43)
pH, Ven: 7.75 (ref 7.25–7.43)
pO2, Ven: 191 mmHg — ABNORMAL HIGH (ref 32–45)
pO2, Ven: 42 mmHg (ref 32–45)

## 2022-10-25 LAB — COMPREHENSIVE METABOLIC PANEL
ALT: 12 U/L (ref 0–44)
AST: 14 U/L — ABNORMAL LOW (ref 15–41)
Albumin: 2.1 g/dL — ABNORMAL LOW (ref 3.5–5.0)
Alkaline Phosphatase: 158 U/L — ABNORMAL HIGH (ref 38–126)
Anion gap: 18 — ABNORMAL HIGH (ref 5–15)
BUN: 28 mg/dL — ABNORMAL HIGH (ref 8–23)
CO2: 22 mmol/L (ref 22–32)
Calcium: 9 mg/dL (ref 8.9–10.3)
Chloride: 82 mmol/L — ABNORMAL LOW (ref 98–111)
Creatinine, Ser: 0.81 mg/dL (ref 0.44–1.00)
GFR, Estimated: >60 mL/min (ref >60)
Glucose, Bld: 560 mg/dL (ref 70–99)
Potassium: 3.9 mmol/L (ref 3.5–5.1)
Sodium: 122 mmol/L — ABNORMAL LOW (ref 135–145)
Total Bilirubin: UNDETERMINED mg/dL (ref 0.3–1.2)
Total Protein: 6.3 g/dL — ABNORMAL LOW (ref 6.5–8.1)

## 2022-10-25 LAB — CBG MONITORING, ED: Glucose-Capillary: 587 mg/dL (ref 70–99)

## 2022-10-25 LAB — BRAIN NATRIURETIC PEPTIDE: B Natriuretic Peptide: 111.6 pg/mL — ABNORMAL HIGH (ref 0.0–100.0)

## 2022-10-25 MED ORDER — INSULIN ASPART 100 UNIT/ML IJ SOLN
10.0000 [IU] | Freq: Once | INTRAMUSCULAR | Status: AC
  Administered 2022-10-26: 10 [IU] via SUBCUTANEOUS

## 2022-10-25 MED ORDER — INSULIN ASPART 100 UNIT/ML IJ SOLN: 6.0000 [IU] | Freq: Once | INTRAMUSCULAR | Status: DC

## 2022-10-25 NOTE — ED Triage Notes (Signed)
Pt BIB EMS from [facility] with c/o bilateral lower leg swelling and pain, hx of CHF. Denies chest pain or SHOB. CBG read HIGH with EMS, has been taking her meds.

## 2022-10-25 NOTE — ED Provider Notes (Signed)
[facility] Provider Note   CSN: [id number] Arrival date & time: 10/25/22  2136     History {Add pertinent medical, surgical, social history, OB history to HPI:1} Chief Complaint  Patient presents with   Leg Swelling    Heidi Dominguez is a 89 y.o. female.  HPI   Patient with medical history of hypertension, hyperlipidemia, type 2 diabetes, diastolic CHF presents to the emergency department due to bilateral lower extremity swelling.  This was noticed about a day or so ago by patient's daughter, patient has been having lower extremity swelling but seems to have worsened acutely.  She does have a history of CHF, does not do typical wanes at the SNF she has been out for the last few weeks.  She was taken off of her metformin at the SNF per daughter because "the GI upset probably does more harm than good per nursing home SNF".  Not put on a different diabetic med.  She denies any chest pain or shortness of breath.  Of note, patient's point-of-care glucose was 585 with EMS.  Home Medications Prior to Admission medications   Medication Sig Start Date End Date Taking? Authorizing Provider  acetaminophen (TYLENOL) 325 MG tablet Take 650 mg by mouth 3 (three) times daily.    [provider]  albuterol (VENTOLIN HFA) 108 (90 Base) MCG/ACT inhaler Inhale 2 puffs into the lungs every 6 (six) hours as needed for wheezing or shortness of breath.    [provider]  amLODipine (NORVASC) 10 MG tablet Take 10 mg by mouth See admin instructions. 10 mg once daily in the morning. HOLD if SBP < 110.    [provider]  aspirin EC 81 MG tablet Take 162 mg by mouth at bedtime.    [provider]  budesonide (PULMICORT) 0.5 MG/2ML nebulizer solution Take 2 mLs (0.5 mg total) by nebulization in the morning and at bedtime. 10/16/22   Cobb, Karie Schwalbe, NP  Cholecalciferol (VITAMIN D3) 25 MCG (1000 UT) capsule Take 1,000 Units by  mouth every Monday.    [provider]  dextromethorphan-guaiFENesin (MUCINEX DM) 30-600 MG 12hr tablet Take 1 tablet by mouth 2 (two) times daily. 10/16/22   Cobb, Karie Schwalbe, NP  diclofenac Sodium (VOLTAREN ARTHRITIS PAIN) 1 % GEL Apply 2 g topically 3 (three) times daily. To both shoulders.    [provider]  furosemide (LASIX) 20 MG tablet Take 20 mg by mouth daily.    [provider]  guaiFENesin (ROBITUSSIN) 100 MG/5ML liquid Take 10 mLs by mouth every 6 (six) hours as needed for cough or to loosen phlegm. 10/01/22   Barb Merino, MD  hydrochlorothiazide (HYDRODIURIL) 25 MG tablet Take 25 mg by mouth daily. 10/09/22   [provider]  hypromellose (GENTEAL SEVERE) 0.3 % GEL ophthalmic ointment Place 1 Application into both eyes 2 (two) times daily.    [provider]  ipratropium-albuterol (DUONEB) 0.5-2.5 (3) MG/3ML SOLN Take 3 mLs by nebulization 3 (three) times daily. 09/19/22   [provider]  Lactobacillus (PROBIOTIC ACIDOPHILUS) CAPS Take 1 capsule by mouth at bedtime.    [provider]  loratadine (CLARITIN) 10 MG tablet Take 10 mg by mouth at bedtime.    [provider]  losartan (COZAAR) 50 MG tablet Take 50 mg by mouth See admin instructions. 50 mg once daily in the morning. HOLD for SBP < 110.    [provider]  Multiple Vitamins-Minerals (PRESERVISION AREDS)  CAPS Take 1 capsule by mouth daily.    [provider]  OMEPRAZOLE PO Take 20 mg by mouth daily. Omeprazole 20 mg DR disintegrating tablet.    [provider]  ondansetron (ZOFRAN) 4 MG tablet Take 4 mg by mouth every 8 (eight) hours as needed for nausea or vomiting.    [provider]  Polyethyl Glycol-Propyl Glycol (LUBRICANT EYE DROPS) 0.4-0.3 % SOLN Place 2 drops into both eyes daily.    [provider]  polyethylene glycol powder (GLYCOLAX/MIRALAX) 17 GM/SCOOP powder Take 17 g by mouth at bedtime.     [provider]      Allergies    Augmentin [amoxicillin-pot clavulanate], Bactrim [sulfamethoxazole-trimethoprim], Calan [verapamil], Catapres [clonidine hcl], Codeine, Hydrocodone, Other, Sulfa antibiotics, Zestril [lisinopril], and Azulfidine [sulfasalazine]    Review of Systems   Review of Systems  Physical Exam Updated Vital Signs BP 134/63 (BP Location: Left Arm)   Pulse (!) 102   Temp 98.5 F (36.9 C) (Oral)   Resp 16   Ht '5\' 4"'$  (1.626 m)   Wt 78 kg   SpO2 94%   BMI 29.52 kg/m  Physical Exam Vitals and nursing note reviewed. Exam conducted with a chaperone present.  Constitutional:      Appearance: Normal appearance.  HENT:     Head: Normocephalic and atraumatic.  Eyes:     General: No scleral icterus.       Right eye: No discharge.        Left eye: No discharge.     Extraocular Movements: Extraocular movements intact.     Pupils: Pupils are equal, round, and reactive to light.  Cardiovascular:     Rate and Rhythm: Normal rate and regular rhythm.     Pulses: Normal pulses.     Heart sounds: Murmur heard.     No friction rub. No gallop.  Pulmonary:     Effort: Pulmonary effort is normal. No respiratory distress.     Breath sounds: Normal breath sounds.  Abdominal:     General: Abdomen is flat. Bowel sounds are normal. There is no distension.     Palpations: Abdomen is soft.     Tenderness: There is no abdominal tenderness.  Musculoskeletal:     Right lower leg: Edema present.     Left lower leg: Edema present.  Skin:    General: Skin is warm and dry.     Coloration: Skin is not jaundiced.  Neurological:     Mental Status: She is alert. Mental status is at baseline.     Coordination: Coordination normal.     ED Results / Procedures / Treatments   Labs (all labs ordered are listed, but only abnormal results are displayed) Labs Reviewed  CBG MONITORING, ED - Abnormal; Notable for the following components:      Result Value   Glucose-Capillary  587 (*)    All other components within normal limits  CBC WITH DIFFERENTIAL/PLATELET  COMPREHENSIVE METABOLIC PANEL  BRAIN NATRIURETIC PEPTIDE  BLOOD GAS, VENOUS    EKG None  Radiology No results found.  Procedures Procedures  {Document cardiac monitor, telemetry assessment procedure when appropriate:1}  Medications Ordered in ED Medications - No data to display  ED Course/ Medical Decision Making/ A&P Clinical Course as of 10/25/22 2335  Wed Oct 25, 2022  2216 Stable 89YOF with BLLE swelling Recent d/c of metformin 2/2 GI upset  [CC]  2303 DG Chest 2 View Cardiomegaly, no vascular congestion or pulmonary edema reassuringly no  infiltrate. [HS]  2335 I-Stat venous blood gas, ED(!) re [HS]    Clinical Course User Index [CC] Tretha Sciara, MD [HS] Sherrill Raring, PA-C   {   Click here for ABCD2, HEART and other calculatorsREFRESH Note before signing :1}                          Medical Decision Making Amount and/or Complexity of Data Reviewed Labs: ordered. Decision-making details documented in ED Course. Radiology: ordered. Decision-making details documented in ED Course.   This is a 89 year old female presenting to the emergency department due to bilateral lower extremity swelling and hyperglycemia.  Differential includes CHF exacerbation, nephrotic syndrome, renal impairment, gross electrolyte derangement, DKA, HHS, arrhythmia, pulmonary edema.  Patient's daughter is at bedside providing independent history.  I reviewed medication list, patient was stopped on her metformin which I think is exacerbating her hyperglycemia.  I ordered, viewed and interpreted laboratory workup. -VBG with ph 7.4, no acidosis not in DKA -CBC with leukocytosis of 13.1 with left shift. -CMP pending -BNP pending   {Document critical care time when appropriate:1} {Document review of labs and clinical decision tools ie heart score, Chads2Vasc2 etc:1}  {Document your independent review  of radiology images, and any outside records:1} {Document your discussion with family members, caretakers, and with consultants:1} {Document social determinants of health affecting pt's care:1} {Document your decision making why or why not admission, treatments were needed:1} Final Clinical Impression(s) / ED Diagnoses Final diagnoses:  None    Rx / DC Orders ED Discharge Orders     None

## 2022-10-25 NOTE — ED Notes (Signed)
VBG results given to [place], [place].

## 2022-10-26 LAB — URINALYSIS, ROUTINE W REFLEX MICROSCOPIC
Bacteria, UA: NONE SEEN
Bilirubin Urine: NEGATIVE
Glucose, UA: >=500 mg/dL — AB
Hgb urine dipstick: NEGATIVE
Ketones, ur: NEGATIVE mg/dL
Leukocytes,Ua: NEGATIVE
Nitrite: NEGATIVE
Protein, ur: NEGATIVE mg/dL
Specific Gravity, Urine: 1.015 (ref 1.005–1.030)
pH: 5 (ref 5.0–8.0)

## 2022-10-26 LAB — CBG MONITORING, ED
Glucose-Capillary: 436 mg/dL — ABNORMAL HIGH (ref 70–99)
Glucose-Capillary: 384 mg/dL — ABNORMAL HIGH (ref 70–99)

## 2022-10-26 MED ORDER — METFORMIN HCL 500 MG PO TABS: 250.0000 mg | ORAL_TABLET | Freq: Two times a day (BID) | ORAL | 0 refills | Status: DC

## 2022-10-26 MED ORDER — FUROSEMIDE 20 MG PO TABS: 10.0000 mg | ORAL_TABLET | Freq: Every day | ORAL | 0 refills | Status: DC

## 2022-10-26 NOTE — ED Notes (Signed)
[facility] called, no eta 

## 2022-10-26 NOTE — Discharge Instructions (Addendum)
Please restart your metformin at 250 BID.  We recommend physical therapy to increase mobility and minimize being sedentary to decrease lower leg swelling.

## 2022-10-26 NOTE — ED Provider Notes (Signed)
I assumed care of this patient.  Please see previous provider note for further details of Hx, PE.  Briefly patient is a 89 y.o. female who presented for bilateral lower extremity edema likely dependent.  Noted to be hyperglycemic.  She has a history of diabetes and was recently taken off her metformin.  Initial labs are reassuring.  No DKA noted.  Patient was given insulin. Currently pending UA.  And reassessment  CBGs are downtrending.  UA without evidence of infection. Patient and family updated. Instructed to restart metformin and take low-dose Lasix for edema.  The patient appears reasonably screened and/or stabilized for discharge and I doubt any other medical condition or other [facility] requiring further screening, evaluation, or treatment in the ED at this time. I have discussed the findings, Dx and Tx plan with the patient/family who expressed understanding and agree(s) with the plan. Discharge instructions discussed at length. The patient/family was given strict return precautions who verbalized understanding of the instructions. No further questions at time of discharge.  Disposition: Discharge  Condition: Good  ED Discharge Orders          Ordered    metFORMIN (GLUCOPHAGE) 500 MG tablet  2 times daily with meals,   Status:  Discontinued        10/26/22 0326    furosemide (LASIX) 20 MG tablet  Daily,   Status:  Discontinued        10/26/22 0326    metFORMIN (GLUCOPHAGE) 500 MG tablet  2 times daily with meals        10/26/22 0336    furosemide (LASIX) 20 MG tablet  Daily        10/26/22 0336              Follow Up: Primary care provider  Call  to schedule an appointment for close follow up         Fatima Blank, MD 10/26/22 [phone]

## 2022-11-03 ENCOUNTER — Other Ambulatory Visit: Payer: Self-pay

## 2022-11-03 ENCOUNTER — Emergency Department (HOSPITAL_COMMUNITY): Payer: Medicare PPO

## 2022-11-03 ENCOUNTER — Encounter (HOSPITAL_COMMUNITY): Payer: Self-pay | Admitting: Internal Medicine

## 2022-11-03 ENCOUNTER — Inpatient Hospital Stay (HOSPITAL_COMMUNITY)
Admission: EM | Admit: 2022-11-03 | Discharge: 2022-11-17 | DRG: 559 | Disposition: A | Discharge status: Skilled Nursing Facility | Payer: Medicare PPO | Source: Skilled Nursing Facility | Attending: Family Medicine | Admitting: Family Medicine

## 2022-11-03 DIAGNOSIS — J9601 Acute respiratory failure with hypoxia: Secondary | ICD-10-CM | POA: Diagnosis present

## 2022-11-03 DIAGNOSIS — Z8616 Personal history of COVID-19: Secondary | ICD-10-CM | POA: Diagnosis not present

## 2022-11-03 DIAGNOSIS — R112 Nausea with vomiting, unspecified: Secondary | ICD-10-CM | POA: Insufficient documentation

## 2022-11-03 DIAGNOSIS — Z794 Long term (current) use of insulin: Secondary | ICD-10-CM | POA: Diagnosis not present

## 2022-11-03 DIAGNOSIS — R1314 Dysphagia, pharyngoesophageal phase: Secondary | ICD-10-CM | POA: Diagnosis present

## 2022-11-03 DIAGNOSIS — I5032 Chronic diastolic (congestive) heart failure: Secondary | ICD-10-CM | POA: Diagnosis not present

## 2022-11-03 DIAGNOSIS — D631 Anemia in chronic kidney disease: Secondary | ICD-10-CM | POA: Diagnosis present

## 2022-11-03 DIAGNOSIS — Z9049 Acquired absence of other specified parts of digestive tract: Secondary | ICD-10-CM

## 2022-11-03 DIAGNOSIS — Z880 Allergy status to penicillin: Secondary | ICD-10-CM

## 2022-11-03 DIAGNOSIS — Z66 Do not resuscitate: Secondary | ICD-10-CM | POA: Diagnosis present

## 2022-11-03 DIAGNOSIS — K219 Gastro-esophageal reflux disease without esophagitis: Secondary | ICD-10-CM | POA: Diagnosis present

## 2022-11-03 DIAGNOSIS — I130 Hypertensive heart and chronic kidney disease with heart failure and stage 1 through stage 4 chronic kidney disease, or unspecified chronic kidney disease: Secondary | ICD-10-CM | POA: Diagnosis present

## 2022-11-03 DIAGNOSIS — B9620 Unspecified Escherichia coli [E. coli] as the cause of diseases classified elsewhere: Secondary | ICD-10-CM | POA: Diagnosis present

## 2022-11-03 DIAGNOSIS — Z7984 Long term (current) use of oral hypoglycemic drugs: Secondary | ICD-10-CM

## 2022-11-03 DIAGNOSIS — R609 Edema, unspecified: Secondary | ICD-10-CM | POA: Diagnosis not present

## 2022-11-03 DIAGNOSIS — E1151 Type 2 diabetes mellitus with diabetic peripheral angiopathy without gangrene: Secondary | ICD-10-CM | POA: Diagnosis present

## 2022-11-03 DIAGNOSIS — K449 Diaphragmatic hernia without obstruction or gangrene: Secondary | ICD-10-CM | POA: Diagnosis present

## 2022-11-03 DIAGNOSIS — Y831 Surgical operation with implant of artificial internal device as the cause of abnormal reaction of the patient, or of later complication, without mention of misadventure at the time of the procedure: Secondary | ICD-10-CM | POA: Diagnosis present

## 2022-11-03 DIAGNOSIS — M7989 Other specified soft tissue disorders: Secondary | ICD-10-CM | POA: Diagnosis present

## 2022-11-03 DIAGNOSIS — E1169 Type 2 diabetes mellitus with other specified complication: Secondary | ICD-10-CM | POA: Diagnosis present

## 2022-11-03 DIAGNOSIS — H9113 Presbycusis, bilateral: Secondary | ICD-10-CM | POA: Diagnosis present

## 2022-11-03 DIAGNOSIS — I350 Nonrheumatic aortic (valve) stenosis: Secondary | ICD-10-CM | POA: Diagnosis present

## 2022-11-03 DIAGNOSIS — I5033 Acute on chronic diastolic (congestive) heart failure: Secondary | ICD-10-CM | POA: Diagnosis present

## 2022-11-03 DIAGNOSIS — R54 Age-related physical debility: Secondary | ICD-10-CM | POA: Diagnosis present

## 2022-11-03 DIAGNOSIS — K861 Other chronic pancreatitis: Secondary | ICD-10-CM | POA: Diagnosis present

## 2022-11-03 DIAGNOSIS — N183 Chronic kidney disease, stage 3 unspecified: Secondary | ICD-10-CM | POA: Diagnosis present

## 2022-11-03 DIAGNOSIS — Z515 Encounter for palliative care: Secondary | ICD-10-CM | POA: Diagnosis not present

## 2022-11-03 DIAGNOSIS — Z882 Allergy status to sulfonamides status: Secondary | ICD-10-CM

## 2022-11-03 DIAGNOSIS — Y792 Prosthetic and other implants, materials and accessory orthopedic devices associated with adverse incidents: Secondary | ICD-10-CM | POA: Diagnosis present

## 2022-11-03 DIAGNOSIS — N1831 Chronic kidney disease, stage 3a: Secondary | ICD-10-CM | POA: Diagnosis present

## 2022-11-03 DIAGNOSIS — R52 Pain, unspecified: Secondary | ICD-10-CM | POA: Diagnosis not present

## 2022-11-03 DIAGNOSIS — J441 Chronic obstructive pulmonary disease with (acute) exacerbation: Secondary | ICD-10-CM | POA: Diagnosis present

## 2022-11-03 DIAGNOSIS — E119 Type 2 diabetes mellitus without complications: Secondary | ICD-10-CM

## 2022-11-03 DIAGNOSIS — J9611 Chronic respiratory failure with hypoxia: Secondary | ICD-10-CM | POA: Diagnosis present

## 2022-11-03 DIAGNOSIS — M009 Pyogenic arthritis, unspecified: Secondary | ICD-10-CM | POA: Diagnosis present

## 2022-11-03 DIAGNOSIS — J9621 Acute and chronic respiratory failure with hypoxia: Secondary | ICD-10-CM | POA: Diagnosis present

## 2022-11-03 DIAGNOSIS — T8459XA Infection and inflammatory reaction due to other internal joint prosthesis, initial encounter: Secondary | ICD-10-CM | POA: Diagnosis present

## 2022-11-03 DIAGNOSIS — Z1623 Resistance to quinolones and fluoroquinolones: Secondary | ICD-10-CM | POA: Diagnosis present

## 2022-11-03 DIAGNOSIS — E7800 Pure hypercholesterolemia, unspecified: Secondary | ICD-10-CM | POA: Diagnosis present

## 2022-11-03 DIAGNOSIS — H6123 Impacted cerumen, bilateral: Secondary | ICD-10-CM | POA: Diagnosis present

## 2022-11-03 DIAGNOSIS — M00811 Arthritis due to other bacteria, right shoulder: Secondary | ICD-10-CM | POA: Diagnosis present

## 2022-11-03 DIAGNOSIS — E669 Obesity, unspecified: Secondary | ICD-10-CM | POA: Diagnosis present

## 2022-11-03 DIAGNOSIS — Z888 Allergy status to other drugs, medicaments and biological substances status: Secondary | ICD-10-CM

## 2022-11-03 DIAGNOSIS — Z96653 Presence of artificial knee joint, bilateral: Secondary | ICD-10-CM | POA: Diagnosis present

## 2022-11-03 DIAGNOSIS — Z7951 Long term (current) use of inhaled steroids: Secondary | ICD-10-CM

## 2022-11-03 DIAGNOSIS — L02413 Cutaneous abscess of right upper limb: Secondary | ICD-10-CM | POA: Diagnosis present

## 2022-11-03 DIAGNOSIS — E1122 Type 2 diabetes mellitus with diabetic chronic kidney disease: Secondary | ICD-10-CM | POA: Diagnosis present

## 2022-11-03 DIAGNOSIS — Z90710 Acquired absence of both cervix and uterus: Secondary | ICD-10-CM

## 2022-11-03 DIAGNOSIS — Z7982 Long term (current) use of aspirin: Secondary | ICD-10-CM

## 2022-11-03 DIAGNOSIS — J449 Chronic obstructive pulmonary disease, unspecified: Secondary | ICD-10-CM | POA: Diagnosis present

## 2022-11-03 DIAGNOSIS — K5900 Constipation, unspecified: Secondary | ICD-10-CM | POA: Diagnosis not present

## 2022-11-03 DIAGNOSIS — I10 Essential (primary) hypertension: Secondary | ICD-10-CM | POA: Diagnosis present

## 2022-11-03 DIAGNOSIS — R410 Disorientation, unspecified: Secondary | ICD-10-CM | POA: Insufficient documentation

## 2022-11-03 DIAGNOSIS — E871 Hypo-osmolality and hyponatremia: Secondary | ICD-10-CM | POA: Diagnosis present

## 2022-11-03 DIAGNOSIS — I3139 Other pericardial effusion (noninflammatory): Secondary | ICD-10-CM | POA: Diagnosis not present

## 2022-11-03 DIAGNOSIS — M25511 Pain in right shoulder: Principal | ICD-10-CM

## 2022-11-03 DIAGNOSIS — Z6832 Body mass index (BMI) 32.0-32.9, adult: Secondary | ICD-10-CM

## 2022-11-03 DIAGNOSIS — A419 Sepsis, unspecified organism: Secondary | ICD-10-CM

## 2022-11-03 DIAGNOSIS — Z79899 Other long term (current) drug therapy: Secondary | ICD-10-CM

## 2022-11-03 DIAGNOSIS — E8809 Other disorders of plasma-protein metabolism, not elsewhere classified: Secondary | ICD-10-CM | POA: Diagnosis present

## 2022-11-03 DIAGNOSIS — T8450XA Infection and inflammatory reaction due to unspecified internal joint prosthesis, initial encounter: Secondary | ICD-10-CM | POA: Diagnosis not present

## 2022-11-03 DIAGNOSIS — Z8249 Family history of ischemic heart disease and other diseases of the circulatory system: Secondary | ICD-10-CM

## 2022-11-03 DIAGNOSIS — D649 Anemia, unspecified: Secondary | ICD-10-CM | POA: Insufficient documentation

## 2022-11-03 DIAGNOSIS — Z885 Allergy status to narcotic agent status: Secondary | ICD-10-CM

## 2022-11-03 DIAGNOSIS — Z833 Family history of diabetes mellitus: Secondary | ICD-10-CM

## 2022-11-03 DIAGNOSIS — M40209 Unspecified kyphosis, site unspecified: Secondary | ICD-10-CM | POA: Diagnosis present

## 2022-11-03 DIAGNOSIS — G8929 Other chronic pain: Secondary | ICD-10-CM | POA: Diagnosis present

## 2022-11-03 DIAGNOSIS — R262 Difficulty in walking, not elsewhere classified: Secondary | ICD-10-CM | POA: Diagnosis present

## 2022-11-03 DIAGNOSIS — J4489 Other specified chronic obstructive pulmonary disease: Secondary | ICD-10-CM | POA: Diagnosis present

## 2022-11-03 DIAGNOSIS — E1165 Type 2 diabetes mellitus with hyperglycemia: Secondary | ICD-10-CM | POA: Diagnosis present

## 2022-11-03 DIAGNOSIS — G4700 Insomnia, unspecified: Secondary | ICD-10-CM | POA: Diagnosis not present

## 2022-11-03 DIAGNOSIS — Z87440 Personal history of urinary (tract) infections: Secondary | ICD-10-CM

## 2022-11-03 DIAGNOSIS — Z96612 Presence of left artificial shoulder joint: Secondary | ICD-10-CM | POA: Diagnosis present

## 2022-11-03 LAB — CBC WITH DIFFERENTIAL/PLATELET
Abs Immature Granulocytes: 0.26 10*3/uL — ABNORMAL HIGH (ref 0.00–0.07)
Basophils Absolute: 0 10*3/uL (ref 0.0–0.1)
Basophils Relative: 0 %
Eosinophils Absolute: 0.1 10*3/uL (ref 0.0–0.5)
Eosinophils Relative: 1 %
HCT: 34.3 % — ABNORMAL LOW (ref 36.0–46.0)
Hemoglobin: 11.2 g/dL — ABNORMAL LOW (ref 12.0–15.0)
Immature Granulocytes: 2 %
Lymphocytes Relative: 5 %
Lymphs Abs: 0.8 10*3/uL (ref 0.7–4.0)
MCH: 27.7 pg (ref 26.0–34.0)
MCHC: 32.7 g/dL (ref 30.0–36.0)
MCV: 84.7 fL (ref 80.0–100.0)
Monocytes Absolute: 1.2 10*3/uL — ABNORMAL HIGH (ref 0.1–1.0)
Monocytes Relative: 8 %
Neutro Abs: 13.1 10*3/uL — ABNORMAL HIGH (ref 1.7–7.7)
Neutrophils Relative %: 84 %
Platelets: 370 10*3/uL (ref 150–400)
RBC: 4.05 MIL/uL (ref 3.87–5.11)
RDW: 15.3 % (ref 11.5–15.5)
WBC: 15.5 10*3/uL — ABNORMAL HIGH (ref 4.0–10.5)
nRBC: 0 % (ref 0.0–0.2)

## 2022-11-03 LAB — MRSA NEXT GEN BY PCR, NASAL: MRSA by PCR Next Gen: NOT DETECTED

## 2022-11-03 LAB — TROPONIN I (HIGH SENSITIVITY)
Troponin I (High Sensitivity): 7 ng/L (ref <18)
Troponin I (High Sensitivity): 8 ng/L (ref <18)

## 2022-11-03 LAB — URINALYSIS, ROUTINE W REFLEX MICROSCOPIC
Bilirubin Urine: NEGATIVE
Glucose, UA: NEGATIVE mg/dL
Hgb urine dipstick: NEGATIVE
Ketones, ur: NEGATIVE mg/dL
Leukocytes,Ua: NEGATIVE
Nitrite: NEGATIVE
Protein, ur: NEGATIVE mg/dL
Specific Gravity, Urine: 1.008 (ref 1.005–1.030)
pH: 7 (ref 5.0–8.0)

## 2022-11-03 LAB — COMPREHENSIVE METABOLIC PANEL
ALT: 11 U/L (ref 0–44)
AST: 23 U/L (ref 15–41)
Albumin: 1.9 g/dL — ABNORMAL LOW (ref 3.5–5.0)
Alkaline Phosphatase: 87 U/L (ref 38–126)
Anion gap: 11 (ref 5–15)
BUN: 17 mg/dL (ref 8–23)
CO2: 27 mmol/L (ref 22–32)
Calcium: 8.4 mg/dL — ABNORMAL LOW (ref 8.9–10.3)
Chloride: 84 mmol/L — ABNORMAL LOW (ref 98–111)
Creatinine, Ser: 0.75 mg/dL (ref 0.44–1.00)
GFR, Estimated: >60 mL/min (ref >60)
Glucose, Bld: 307 mg/dL — ABNORMAL HIGH (ref 70–99)
Potassium: 5.1 mmol/L (ref 3.5–5.1)
Sodium: 122 mmol/L — ABNORMAL LOW (ref 135–145)
Total Bilirubin: 0.5 mg/dL (ref 0.3–1.2)
Total Protein: 6.1 g/dL — ABNORMAL LOW (ref 6.5–8.1)

## 2022-11-03 LAB — LACTIC ACID, PLASMA
Lactic Acid, Venous: 3.3 mmol/L (ref 0.5–1.9)
Lactic Acid, Venous: 1.4 mmol/L (ref 0.5–1.9)

## 2022-11-03 LAB — GLUCOSE, CAPILLARY
Glucose-Capillary: 177 mg/dL — ABNORMAL HIGH (ref 70–99)
Glucose-Capillary: 193 mg/dL — ABNORMAL HIGH (ref 70–99)

## 2022-11-03 LAB — C-REACTIVE PROTEIN: CRP: 16 mg/dL — ABNORMAL HIGH (ref <1.0)

## 2022-11-03 LAB — BRAIN NATRIURETIC PEPTIDE: B Natriuretic Peptide: 224.2 pg/mL — ABNORMAL HIGH (ref 0.0–100.0)

## 2022-11-03 LAB — SEDIMENTATION RATE: Sed Rate: 49 mm/hr — ABNORMAL HIGH (ref 0–22)

## 2022-11-03 MED ORDER — HEPARIN SODIUM (PORCINE) 5000 UNIT/ML IJ SOLN
5000.0000 [IU] | Freq: Three times a day (TID) | INTRAMUSCULAR | Status: DC
  Administered 2022-11-03 – 2022-11-17 (×41): 5000 [IU] via SUBCUTANEOUS
  Filled 2022-11-03 (×40): qty 1

## 2022-11-03 MED ORDER — MORPHINE SULFATE (PF) 2 MG/ML IV SOLN
2.0000 mg | INTRAVENOUS | Status: DC | PRN
  Administered 2022-11-03: 2 mg via INTRAVENOUS
  Filled 2022-11-03: qty 1

## 2022-11-03 MED ORDER — SODIUM CHLORIDE 0.9 % IV BOLUS
500.0000 mL | Freq: Once | INTRAVENOUS | Status: AC
  Administered 2022-11-03: 500 mL via INTRAVENOUS

## 2022-11-03 MED ORDER — LOSARTAN POTASSIUM 50 MG PO TABS
50.0000 mg | ORAL_TABLET | Freq: Every day | ORAL | Status: DC
  Administered 2022-11-04 – 2022-11-08 (×5): 50 mg via ORAL
  Filled 2022-11-03 (×5): qty 1

## 2022-11-03 MED ORDER — ALBUTEROL SULFATE HFA 108 (90 BASE) MCG/ACT IN AERS: 2.0000 | INHALATION_SPRAY | Freq: Four times a day (QID) | RESPIRATORY_TRACT | Status: DC | PRN

## 2022-11-03 MED ORDER — POLYVINYL ALCOHOL 1.4 % OP SOLN
2.0000 [drp] | Freq: Every day | OPHTHALMIC | Status: DC
  Administered 2022-11-04 – 2022-11-17 (×14): 2 [drp] via OPHTHALMIC
  Filled 2022-11-03: qty 15

## 2022-11-03 MED ORDER — IPRATROPIUM-ALBUTEROL 0.5-2.5 (3) MG/3ML IN SOLN: 3.0000 mL | Freq: Three times a day (TID) | RESPIRATORY_TRACT | Status: DC

## 2022-11-03 MED ORDER — ACETAMINOPHEN 325 MG PO TABS: 650.0000 mg | ORAL_TABLET | Freq: Three times a day (TID) | ORAL | Status: DC

## 2022-11-03 MED ORDER — FENTANYL CITRATE PF 50 MCG/ML IJ SOSY
50.0000 ug | PREFILLED_SYRINGE | Freq: Once | INTRAMUSCULAR | Status: AC
  Administered 2022-11-03: 50 ug via INTRAVENOUS
  Filled 2022-11-03: qty 1

## 2022-11-03 MED ORDER — VANCOMYCIN HCL 1750 MG/350ML IV SOLN
1750.0000 mg | INTRAVENOUS | Status: DC
  Administered 2022-11-05: 1750 mg via INTRAVENOUS
  Filled 2022-11-03: qty 350

## 2022-11-03 MED ORDER — VANCOMYCIN HCL 1750 MG/350ML IV SOLN
1750.0000 mg | Freq: Once | INTRAVENOUS | Status: AC
  Administered 2022-11-03: 1750 mg via INTRAVENOUS
  Filled 2022-11-03: qty 350

## 2022-11-03 MED ORDER — SODIUM CHLORIDE 0.9 % IV SOLN
2.0000 g | Freq: Two times a day (BID) | INTRAVENOUS | Status: DC
  Administered 2022-11-03 – 2022-11-05 (×4): 2 g via INTRAVENOUS
  Filled 2022-11-03 (×4): qty 12.5

## 2022-11-03 MED ORDER — FUROSEMIDE 40 MG PO TABS
40.0000 mg | ORAL_TABLET | Freq: Every day | ORAL | Status: DC
  Administered 2022-11-03 – 2022-11-09 (×7): 40 mg via ORAL
  Filled 2022-11-03 (×7): qty 1

## 2022-11-03 MED ORDER — ASPIRIN 81 MG PO TBEC
162.0000 mg | DELAYED_RELEASE_TABLET | Freq: Every day | ORAL | Status: DC
  Administered 2022-11-03 – 2022-11-16 (×14): 162 mg via ORAL
  Filled 2022-11-03 (×14): qty 2

## 2022-11-03 MED ORDER — DM-GUAIFENESIN ER 30-600 MG PO TB12
1.0000 | ORAL_TABLET | Freq: Two times a day (BID) | ORAL | Status: DC
  Administered 2022-11-03 – 2022-11-17 (×28): 1 via ORAL
  Filled 2022-11-03 (×29): qty 1

## 2022-11-03 MED ORDER — RISAQUAD PO CAPS
1.0000 | ORAL_CAPSULE | Freq: Every day | ORAL | Status: DC
  Administered 2022-11-03 – 2022-11-16 (×14): 1 via ORAL
  Filled 2022-11-03 (×15): qty 1

## 2022-11-03 MED ORDER — SENNA 8.6 MG PO TABS
1.0000 | ORAL_TABLET | Freq: Two times a day (BID) | ORAL | Status: DC
  Administered 2022-11-03 – 2022-11-11 (×17): 8.6 mg via ORAL
  Filled 2022-11-03 (×18): qty 1

## 2022-11-03 MED ORDER — LOSARTAN POTASSIUM 50 MG PO TABS: 50.0000 mg | ORAL_TABLET | ORAL | Status: DC

## 2022-11-03 MED ORDER — LORATADINE 10 MG PO TABS
10.0000 mg | ORAL_TABLET | Freq: Every day | ORAL | Status: DC
  Administered 2022-11-03 – 2022-11-16 (×14): 10 mg via ORAL
  Filled 2022-11-03 (×14): qty 1

## 2022-11-03 MED ORDER — HYPROMELLOSE 0.3 % OP GEL
1.0000 | Freq: Two times a day (BID) | OPHTHALMIC | Status: DC
  Filled 2022-11-03: qty 3.5

## 2022-11-03 MED ORDER — FUROSEMIDE 20 MG PO TABS: 10.0000 mg | ORAL_TABLET | Freq: Every day | ORAL | Status: DC

## 2022-11-03 MED ORDER — ACETAMINOPHEN 325 MG PO TABS
650.0000 mg | ORAL_TABLET | Freq: Four times a day (QID) | ORAL | Status: DC | PRN
  Administered 2022-11-03 – 2022-11-17 (×9): 650 mg via ORAL
  Filled 2022-11-03 (×10): qty 2

## 2022-11-03 MED ORDER — NALOXONE HCL 0.4 MG/ML IJ SOLN: 0.4000 mg | INTRAMUSCULAR | Status: DC | PRN

## 2022-11-03 MED ORDER — PANTOPRAZOLE SODIUM 40 MG PO TBEC
40.0000 mg | DELAYED_RELEASE_TABLET | Freq: Every day | ORAL | Status: DC
  Administered 2022-11-03 – 2022-11-17 (×15): 40 mg via ORAL
  Filled 2022-11-03 (×15): qty 1

## 2022-11-03 MED ORDER — POLYETHYLENE GLYCOL 3350 17 G PO PACK
17.0000 g | PACK | Freq: Every day | ORAL | Status: DC
  Administered 2022-11-03 – 2022-11-05 (×3): 17 g via ORAL
  Filled 2022-11-03 (×3): qty 1

## 2022-11-03 MED ORDER — SODIUM CHLORIDE 0.9 % IV SOLN: INTRAVENOUS | Status: DC

## 2022-11-03 MED ORDER — BUDESONIDE 0.5 MG/2ML IN SUSP
0.5000 mg | RESPIRATORY_TRACT | Status: DC
  Administered 2022-11-04 – 2022-11-17 (×20): 0.5 mg via RESPIRATORY_TRACT
  Filled 2022-11-03 (×28): qty 2

## 2022-11-03 MED ORDER — INSULIN ASPART 100 UNIT/ML IJ SOLN
0.0000 [IU] | Freq: Three times a day (TID) | INTRAMUSCULAR | Status: DC
  Administered 2022-11-03: 4 [IU] via SUBCUTANEOUS
  Administered 2022-11-04: 3 [IU] via SUBCUTANEOUS
  Administered 2022-11-04: 7 [IU] via SUBCUTANEOUS
  Administered 2022-11-05: 3 [IU] via SUBCUTANEOUS
  Administered 2022-11-05 – 2022-11-06 (×4): 4 [IU] via SUBCUTANEOUS
  Administered 2022-11-06: 7 [IU] via SUBCUTANEOUS
  Administered 2022-11-07: 3 [IU] via SUBCUTANEOUS
  Administered 2022-11-07: 4 [IU] via SUBCUTANEOUS
  Administered 2022-11-07: 7 [IU] via SUBCUTANEOUS
  Administered 2022-11-08 (×2): 4 [IU] via SUBCUTANEOUS
  Administered 2022-11-08: 11 [IU] via SUBCUTANEOUS
  Administered 2022-11-09: 15 [IU] via SUBCUTANEOUS
  Administered 2022-11-09: 11 [IU] via SUBCUTANEOUS
  Administered 2022-11-09: 4 [IU] via SUBCUTANEOUS
  Administered 2022-11-10: 11 [IU] via SUBCUTANEOUS
  Administered 2022-11-10: 3 [IU] via SUBCUTANEOUS
  Administered 2022-11-10: 7 [IU] via SUBCUTANEOUS
  Administered 2022-11-11 (×2): 4 [IU] via SUBCUTANEOUS
  Administered 2022-11-11: 11 [IU] via SUBCUTANEOUS
  Administered 2022-11-12: 7 [IU] via SUBCUTANEOUS
  Administered 2022-11-12: 11 [IU] via SUBCUTANEOUS
  Administered 2022-11-12 – 2022-11-13 (×2): 4 [IU] via SUBCUTANEOUS
  Administered 2022-11-13 – 2022-11-14 (×3): 7 [IU] via SUBCUTANEOUS
  Administered 2022-11-14: 11 [IU] via SUBCUTANEOUS
  Administered 2022-11-15 (×2): 4 [IU] via SUBCUTANEOUS
  Administered 2022-11-15 – 2022-11-16 (×2): 11 [IU] via SUBCUTANEOUS
  Administered 2022-11-16: 3 [IU] via SUBCUTANEOUS
  Administered 2022-11-17: 4 [IU] via SUBCUTANEOUS
  Administered 2022-11-17: 7 [IU] via SUBCUTANEOUS

## 2022-11-03 MED ORDER — IOHEXOL 350 MG/ML SOLN
75.0000 mL | Freq: Once | INTRAVENOUS | Status: AC | PRN
  Administered 2022-11-03: 75 mL via INTRAVENOUS

## 2022-11-03 MED ORDER — ACETAMINOPHEN 650 MG RE SUPP: 650.0000 mg | Freq: Four times a day (QID) | RECTAL | Status: DC | PRN

## 2022-11-03 MED ORDER — ONDANSETRON HCL 4 MG PO TABS: 4.0000 mg | ORAL_TABLET | Freq: Three times a day (TID) | ORAL | Status: DC | PRN

## 2022-11-03 MED ORDER — AMLODIPINE BESYLATE 10 MG PO TABS
10.0000 mg | ORAL_TABLET | Freq: Every day | ORAL | Status: DC
  Administered 2022-11-04 – 2022-11-17 (×14): 10 mg via ORAL
  Filled 2022-11-03 (×14): qty 1

## 2022-11-03 MED ORDER — DICLOFENAC SODIUM 1 % EX GEL
2.0000 g | Freq: Three times a day (TID) | CUTANEOUS | Status: DC
  Administered 2022-11-03 – 2022-11-17 (×38): 2 g via TOPICAL
  Filled 2022-11-03: qty 100

## 2022-11-03 MED ORDER — ARTIFICIAL TEARS OPHTHALMIC OINT
TOPICAL_OINTMENT | Freq: Two times a day (BID) | OPHTHALMIC | Status: DC
  Filled 2022-11-03: qty 3.5

## 2022-11-03 MED ORDER — ALBUTEROL SULFATE (2.5 MG/3ML) 0.083% IN NEBU
2.5000 mg | INHALATION_SOLUTION | Freq: Four times a day (QID) | RESPIRATORY_TRACT | Status: DC | PRN
  Administered 2022-11-10 – 2022-11-17 (×4): 2.5 mg via RESPIRATORY_TRACT
  Filled 2022-11-03 (×7): qty 3

## 2022-11-03 NOTE — Subjective & Objective (Signed)
Heidi Dominguez, a 89 y/o with h/o bilateral shoulder replacement, previous right septic shoulder requiring antibiotic beads presents due to increased swelling of the right shoulder and pain with movement. She has not had a fever, rigors. She has a residual cough from a recent bout of RSV but otherwise has been doing well. Due to her symptoms she was transported to [facility] for evaluation.

## 2022-11-03 NOTE — ED Provider Notes (Signed)
[facility] Provider Note   CSN: [id number] Arrival date & time: 11/03/22  1051     History  Chief Complaint  Patient presents with   Shoulder Pain    Heidi Dominguez is a 89 y.o. female.  Patient is here for right shoulder pain and abnormal labs.  Patient with history of heart failure, hypertension, chronic right shoulder pain/history of replacement and infection presents with sodium of 124 that was seen yesterday at her facility.  She has been having some generalized weakness and now some pain in the right shoulder.  She has a history of infection in the prosthetic in the right shoulder.  They were concerned because she also had a white count of 13.  She has had increased pain and swelling to the right shoulder here over the last day or 2 similar to when she has had issues with her prosthetic in the past.  She denies any fevers or chills.  Denies any chest pain or shortness of breath.  She was seen here recently for leg swelling.  Family still concerned about swelling in her left leg and want an ultrasound study done.  The history is provided by the patient.       Home Medications Prior to Admission medications   Medication Sig Start Date End Date Taking? Authorizing Provider  acetaminophen (TYLENOL) 325 MG tablet Take 650 mg by mouth 3 (three) times daily.    [provider]  albuterol (VENTOLIN HFA) 108 (90 Base) MCG/ACT inhaler Inhale 2 puffs into the lungs every 6 (six) hours as needed for wheezing or shortness of breath.    [provider]  amLODipine (NORVASC) 10 MG tablet Take 10 mg by mouth See admin instructions. 10 mg once daily in the morning. HOLD if SBP < 110.    [provider]  aspirin EC 81 MG tablet Take 162 mg by mouth at bedtime.    [provider]  budesonide (PULMICORT) 0.5 MG/2ML nebulizer solution Take 2 mLs (0.5 mg total) by nebulization in the morning and at bedtime. 10/16/22    Cobb, Karie Schwalbe, NP  Cholecalciferol (VITAMIN D3) 25 MCG (1000 UT) capsule Take 1,000 Units by mouth every Monday.    [provider]  dextromethorphan-guaiFENesin (MUCINEX DM) 30-600 MG 12hr tablet Take 1 tablet by mouth 2 (two) times daily. 10/16/22   Cobb, Karie Schwalbe, NP  diclofenac Sodium (VOLTAREN ARTHRITIS PAIN) 1 % GEL Apply 2 g topically 3 (three) times daily. To both shoulders.    [provider]  furosemide (LASIX) 20 MG tablet Take 0.5 tablets (10 mg total) by mouth daily for 10 days. 10/26/22 11/05/22  Tacy Learn, PA-C  guaiFENesin (ROBITUSSIN) 100 MG/5ML liquid Take 10 mLs by mouth every 6 (six) hours as needed for cough or to loosen phlegm. 10/01/22   Barb Merino, MD  hydrochlorothiazide (HYDRODIURIL) 25 MG tablet Take 25 mg by mouth daily. 10/09/22   [provider]  hypromellose (GENTEAL SEVERE) 0.3 % GEL ophthalmic ointment Place 1 Application into both eyes 2 (two) times daily.    [provider]  ipratropium-albuterol (DUONEB) 0.5-2.5 (3) MG/3ML SOLN Take 3 mLs by nebulization 3 (three) times daily. 09/19/22   [provider]  Lactobacillus (PROBIOTIC ACIDOPHILUS) CAPS Take 1 capsule by mouth at bedtime.    [provider]  loratadine (CLARITIN) 10 MG tablet Take 10 mg by mouth at bedtime.    [provider]  losartan (COZAAR) 50  MG tablet Take 50 mg by mouth See admin instructions. 50 mg once daily in the morning. HOLD for SBP < 110.    [provider]  metFORMIN (GLUCOPHAGE) 500 MG tablet Take 0.5 tablets (250 mg total) by mouth 2 (two) times daily with a meal. 10/26/22 11/25/22  Tacy Learn, PA-C  Multiple Vitamins-Minerals (PRESERVISION AREDS) CAPS Take 1 capsule by mouth daily.    [provider]  OMEPRAZOLE PO Take 20 mg by mouth daily. Omeprazole 20 mg DR disintegrating tablet.    [provider]  ondansetron (ZOFRAN) 4 MG tablet Take 4 mg by mouth every 8 (eight) hours as needed  for nausea or vomiting.    [provider]  Polyethyl Glycol-Propyl Glycol (LUBRICANT EYE DROPS) 0.4-0.3 % SOLN Place 2 drops into both eyes daily.    [provider]  polyethylene glycol powder (GLYCOLAX/MIRALAX) 17 GM/SCOOP powder Take 17 g by mouth at bedtime.    [provider]      Allergies    Augmentin [amoxicillin-pot clavulanate], Bactrim [sulfamethoxazole-trimethoprim], Calan [verapamil], Catapres [clonidine hcl], Codeine, Hydrocodone, Other, Sulfa antibiotics, Zestril [lisinopril], and Azulfidine [sulfasalazine]    Review of Systems   Review of Systems  Physical Exam Updated Vital Signs BP (!) 129/57 (BP Location: Left Arm)   Pulse 83   Temp 99.1 F (37.3 C) (Rectal)   Resp 16   SpO2 93%  Physical Exam Vitals and nursing note reviewed.  Constitutional:      General: She is not in acute distress.    Appearance: She is well-developed. She is not ill-appearing.  HENT:     Head: Normocephalic and atraumatic.     Nose: Nose normal.     Mouth/Throat:     Mouth: Mucous membranes are moist.  Eyes:     Extraocular Movements: Extraocular movements intact.     Conjunctiva/sclera: Conjunctivae normal.     Pupils: Pupils are equal, round, and reactive to light.  Cardiovascular:     Rate and Rhythm: Normal rate and regular rhythm.     Pulses: Normal pulses.     Heart sounds: Normal heart sounds. No murmur heard. Pulmonary:     Effort: Pulmonary effort is normal. No respiratory distress.     Breath sounds: Normal breath sounds.  Abdominal:     Palpations: Abdomen is soft.     Tenderness: There is no abdominal tenderness.  Musculoskeletal:        General: Swelling and tenderness present.     Cervical back: Neck supple.     Right lower leg: Edema present.     Left lower leg: Edema present.     Comments: Tenderness and swelling to the right shoulder unable to range her shoulder however suspect that is chronic, left greater than right leg swelling   Skin:    General: Skin is warm and dry.     Capillary Refill: Capillary refill takes less than 2 seconds.  Neurological:     General: No focal deficit present.     Mental Status: She is alert and oriented to person, place, and time.     Sensory: No sensory deficit.     Motor: No weakness.  Psychiatric:        Mood and Affect: Mood normal.     ED Results / Procedures / Treatments   Labs (all labs ordered are listed, but only abnormal results are displayed) Labs Reviewed  C-REACTIVE PROTEIN - Abnormal; Notable for the following components:  Result Value   CRP 16.0 (*)    All other components within normal limits  SEDIMENTATION RATE - Abnormal; Notable for the following components:   Sed Rate 49 (*)    All other components within normal limits  CBC WITH DIFFERENTIAL/PLATELET - Abnormal; Notable for the following components:   WBC 15.5 (*)    Hemoglobin 11.2 (*)    HCT 34.3 (*)    Neutro Abs 13.1 (*)    Monocytes Absolute 1.2 (*)    Abs Immature Granulocytes 0.26 (*)    All other components within normal limits  COMPREHENSIVE METABOLIC PANEL - Abnormal; Notable for the following components:   Sodium 122 (*)    Chloride 84 (*)    Glucose, Bld 307 (*)    Calcium 8.4 (*)    Total Protein 6.1 (*)    Albumin 1.9 (*)    All other components within normal limits  LACTIC ACID, PLASMA - Abnormal; Notable for the following components:   Lactic Acid, Venous 3.3 (*)    All other components within normal limits  BRAIN NATRIURETIC PEPTIDE - Abnormal; Notable for the following components:   B Natriuretic Peptide 224.2 (*)    All other components within normal limits  CULTURE, BLOOD (ROUTINE X 2)  CULTURE, BLOOD (ROUTINE X 2)  LACTIC ACID, PLASMA  URINALYSIS, ROUTINE W REFLEX MICROSCOPIC  TROPONIN I (HIGH SENSITIVITY)  TROPONIN I (HIGH SENSITIVITY)    EKG EKG Interpretation  Date/Time:  Friday November 03 2022 11:01:14 EDT Ventricular Rate:  84 PR Interval:  166 QRS  Duration: 88 QT Interval:  341 QTC Calculation: 403 R Axis:   49 Text Interpretation: Sinus rhythm Abnormal R-wave progression, early transition Confirmed by Ronnald Nian, Vanisha Whiten ([id number]) on 11/03/2022 11:14:35 AM  Radiology VAS [place] LOWER EXTREMITY VENOUS (DVT) (7a-7p)  Result Date: 11/03/2022  Lower Venous DVT Study Patient Name:  Heidi Dominguez  Date of Exam:   11/03/2022 Medical Rec #: [id number]        Accession #:    [id number] Date of Birth: August 27, 1920        Patient Gender: F Patient Age:   86 years Exam Location:  [facility] Procedure:      VAS [place] LOWER EXTREMITY VENOUS (DVT) Referring Phys: Diedre Maclellan --------------------------------------------------------------------------------  Indications: Pain, Edema, and Chronic swelling.  Risk Factors: Surgery Lower ext bypass graft, left leg stenting. Comparison Study: 03/16/21 - Negative LE venous Performing Technologist: Velva Harman Sturdivant RDMS, RVT  Examination Guidelines: A complete evaluation includes B-mode imaging, spectral Doppler, color Doppler, and power Doppler as needed of all accessible portions of each vessel. Bilateral testing is considered an integral part of a complete examination. Limited examinations for reoccurring indications may be performed as noted. The reflux portion of the exam is performed with the patient in reverse Trendelenburg.  +---------+---------------+---------+-----------+----------+-------------------+ RIGHT    CompressibilityPhasicitySpontaneityPropertiesThrombus Aging      +---------+---------------+---------+-----------+----------+-------------------+ CFV      Full           Yes      Yes                                      +---------+---------------+---------+-----------+----------+-------------------+ SFJ      Full                                                             +---------+---------------+---------+-----------+----------+-------------------+  FV Prox  Full                                                              +---------+---------------+---------+-----------+----------+-------------------+ FV Mid   Full                                                             +---------+---------------+---------+-----------+----------+-------------------+ FV DistalFull                                                             +---------+---------------+---------+-----------+----------+-------------------+ PFV      Full                                                             +---------+---------------+---------+-----------+----------+-------------------+ POP      Full           Yes      Yes                                      +---------+---------------+---------+-----------+----------+-------------------+ PTV      Full                                                             +---------+---------------+---------+-----------+----------+-------------------+ PERO                                                  Not well visualized +---------+---------------+---------+-----------+----------+-------------------+   Right Technical Findings: Bilateral calf veins were not well visualized. Cannot exclude chronic DVT in the calf veins.  +---------+---------------+---------+-----------+----------+-------------------+ LEFT     CompressibilityPhasicitySpontaneityPropertiesThrombus Aging      +---------+---------------+---------+-----------+----------+-------------------+ CFV      Full           Yes      Yes                                      +---------+---------------+---------+-----------+----------+-------------------+ SFJ      Full                                                             +---------+---------------+---------+-----------+----------+-------------------+  FV Prox  Full                                                             +---------+---------------+---------+-----------+----------+-------------------+ FV Mid    Full                                                             +---------+---------------+---------+-----------+----------+-------------------+ FV DistalFull                                                             +---------+---------------+---------+-----------+----------+-------------------+ PFV      Full                                                             +---------+---------------+---------+-----------+----------+-------------------+ POP      Full           Yes      Yes                                      +---------+---------------+---------+-----------+----------+-------------------+ PTV      Full                                                             +---------+---------------+---------+-----------+----------+-------------------+ PERO                                                  Not well visualized +---------+---------------+---------+-----------+----------+-------------------+     Summary: BILATERAL: - No evidence of acute deep vein thrombosis seen in the lower extremities, bilaterally. -No evidence of popliteal cyst, bilaterally. RIGHT: -   *See table(s) above for measurements and observations.    Preliminary    DG Chest 2 View  Result Date: 11/03/2022 CLINICAL DATA:  Chest pain. EXAM: CHEST - 2 VIEW COMPARISON:  October 25, 2022. FINDINGS: Stable cardiomegaly. Large hiatal hernia is noted. Minimal right basilar subsegmental atelectasis is noted. Status post bilateral shoulder arthroplasties. IMPRESSION: Minimal right basilar subsegmental atelectasis. Large hiatal hernia. Electronically Signed   By: Marijo Conception M.D.   On: 11/03/2022 12:06   DG Shoulder Right  Result Date: 11/03/2022 CLINICAL DATA:  Pain EXAM: RIGHT SHOULDER - 3 VIEW COMPARISON:  None Available. FINDINGS: There is a right shoulder prosthesis. There appears to be grossly anatomic alignment. There is absence of the  acromial humeral distance indicating the presence of  rotator cuff defect. No acute fracture, dislocation or subluxation identified. IMPRESSION: Status post right shoulder arthroplasty. No acute osseous abnormalities are seen. Electronically Signed   By: Sammie Bench M.D.   On: 11/03/2022 12:03    Procedures .Critical Care  Performed by: Lennice Sites, DO Authorized by: Lennice Sites, DO   Critical care provider statement:    Critical care time (minutes):  45   Critical care was necessary to treat or prevent imminent or life-threatening deterioration of the following conditions:  Sepsis   Critical care was time spent personally by me on the following activities:  Blood draw for specimens, development of treatment plan with patient or surrogate, discussions with primary provider, evaluation of patient's response to treatment, discussions with consultants, obtaining history from patient or surrogate, examination of patient, ordering and performing treatments and interventions, ordering and review of laboratory studies, ordering and review of radiographic studies, pulse oximetry, re-evaluation of patient's condition and review of old charts   I assumed direction of critical care for this patient from another provider in my specialty: no       Medications Ordered in ED Medications  ceFEPIme (MAXIPIME) 2 g in sodium chloride 0.9 % 100 mL IVPB (2 g Intravenous New Bag/Given 11/03/22 1434)  vancomycin (VANCOREADY) IVPB 1750 mg/350 mL (has no administration in time range)  acetaminophen (TYLENOL) tablet 650 mg (has no administration in time range)  aspirin EC tablet 162 mg (has no administration in time range)  amLODipine (NORVASC) tablet 10 mg (has no administration in time range)  furosemide (LASIX) tablet 10 mg (has no administration in time range)  losartan (COZAAR) tablet 50 mg (has no administration in time range)  Probiotic Acidophilus CAPS 1 capsule (has no administration in time range)  pantoprazole (PROTONIX) EC tablet 40 mg (has no  administration in time range)  ondansetron (ZOFRAN) tablet 4 mg (has no administration in time range)  polyethylene glycol powder (GLYCOLAX/MIRALAX) container 17 g (has no administration in time range)  albuterol (VENTOLIN HFA) 108 (90 Base) MCG/ACT inhaler 2 puff (has no administration in time range)  budesonide (PULMICORT) nebulizer solution 0.5 mg (has no administration in time range)  dextromethorphan-guaiFENesin (MUCINEX DM) 30-600 MG per 12 hr tablet 1 tablet (has no administration in time range)  ipratropium-albuterol (DUONEB) 0.5-2.5 (3) MG/3ML nebulizer solution 3 mL (has no administration in time range)  loratadine (CLARITIN) tablet 10 mg (has no administration in time range)  diclofenac Sodium (VOLTAREN) 1 % topical gel 2 g (has no administration in time range)  hypromellose (GENTEAL) 0.3 % ophthalmic ointment 1 Application (has no administration in time range)  Polyethyl Glycol-Propyl Glycol 0.4-0.3 % SOLN 2 drop (has no administration in time range)  vancomycin (VANCOREADY) IVPB 1750 mg/350 mL (has no administration in time range)  fentaNYL (SUBLIMAZE) injection 50 mcg (50 mcg Intravenous Given 11/03/22 1335)  sodium chloride 0.9 % bolus 500 mL (500 mLs Intravenous New Bag/Given 11/03/22 1430)  iohexol (OMNIPAQUE) 350 MG/ML injection 75 mL (75 mLs Intravenous Contrast Given 11/03/22 1411)    ED Course/ Medical Decision Making/ A&P                             Medical Decision Making Amount and/or Complexity of Data Reviewed Labs: ordered. Radiology: ordered.  Risk Prescription drug management. Decision regarding hospitalization.   LLEWELLYN ASWAD is here for evaluation of normal labs, concern for right shoulder infection.  Normal vitals.  No fever.  She had a sodium of 124 and white count of 13 was sent for evaluation.  History of prosthetic infection to the right shoulder in the past.  Increased pain and swelling to this area over the last day or 2.  Clinically she does have  some swelling over the deltoid area and tenderness.  She has diminished range of motion but I suspect that is chronic and she agrees that it is.  Neurovascular neuromuscular she is intact otherwise.  Family concerned about asymmetric swelling in the left leg versus the right and 1 ultrasound done.  Ultimately she had a sodium of 124.  History of the same.  Sodium was normal a few weeks ago.  This could be medication related.  She has no respiratory symptoms.  No chest pain.  Differential diagnosis is possibly inflammatory versus infectious process in the right shoulder.  She does not have a fever.  Sodium could be medication related or volume fluid related.  She appears may be mildly overloaded on exam.  Will evaluate for volume overload/CHF, infectious process with blood cultures, ESR and CRP and x-ray of the right shoulder.  Will get ultrasound of the left lower extremity to rule out DVT.  I have talked with Hilbert Odor with orthopedics who will follow-up labs and imaging and talk with Dr. Alma Friendly her primary orthopedic doctor.  Per my review and interpretation of labs patient has lactic acidosis of 3, white count is 16.  Will and activate a code sepsis and start broad-spectrum IV antibiotics.  Talked with orthopedics who is okay with this and will start IV vancomycin and cefepime.  Inflammatory markers are elevated including CRP and sed rate.  Lab work otherwise unremarkable except for sodium of 122 which I suspect could be from illness or from medications.  Will start some gentle hydration with fluids.  BNP was unremarkable.  Troponin normal.  DVT study is normal.  Chest x-ray with no evidence of pneumonia.  Plan per orthopedics is to get a CT of the right shoulder with contrast.  They will continue to follow along.  Patient to be admitted for concern for sepsis from a septic right shoulder joint.  Also with hyponatremia.  Admitted to medicine for further care.  This chart was dictated using voice  recognition software.  Despite best efforts to proofread,  errors can occur which can change the documentation meaning.         Final Clinical Impression(s) / ED Diagnoses Final diagnoses:  Pain in joint of right shoulder  Sepsis, due to unspecified organism, unspecified whether acute organ dysfunction present [facility])  Hyponatremia    Rx / DC Orders ED Discharge Orders     None         Lennice Sites, DO 11/03/22 1444

## 2022-11-03 NOTE — Assessment & Plan Note (Signed)
H/o bilateral shoulder replacement and prior infection right prosthetic shoulder. Per daughter she is not a candidate for removal of hardware. Patient with swelling and pain, leukocytosis. Mr. Heidi Dominguez, [place] for [facility] has been consulted: recommended CT shoulder and IV abx.  Plan  Continue cefepime and Vanco pending culture results and CT  Ortho to manage care of shoulder

## 2022-11-03 NOTE — Progress Notes (Signed)
[facility] night cross cover note:   I was notified by RN that the patient is experiencing some right-sided shoulder discomfort that is refractory to existing order for prn acetaminophen.  I subsequently started for morphine 2 mg IV every 4 hours as needed.      Babs Bertin, DO Hospitalist

## 2022-11-03 NOTE — ED Triage Notes (Signed)
Pt BIB [facility] from [facility] for Abn labs.  EMS reports low Na and elevated WBC.  EMS endorsed a hx of MRSA in right arm and complains of right arm pain.   Pt A&O, HOH, VSS

## 2022-11-03 NOTE — Progress Notes (Signed)
Pharmacy Antibiotic Note  Heidi Dominguez is a 89 y.o. female admitted on 11/03/2022 with  shoulder infection .  Pharmacy has been consulted for cefepime and vancomycin dosing.  Plan: Vancomycin 1750 mg IV Q48H (eAUC 488, Vd 0.72) Cefepime 2 g IV Q12H Monitor renal function Obtain vancomycin levels as indicated  Temp (24hrs), Avg:99.1 F (37.3 C), Min:99 F (37.2 C), Max:99.1 F (37.3 C)  Recent Labs  Lab 11/03/22 1136  WBC 15.5*  CREATININE 0.75  LATICACIDVEN 3.3*    Estimated Creatinine Clearance: 38.7 mL/min (by C-G formula based on SCr of 0.75 mg/dL).    Allergies  Allergen Reactions   Augmentin [Amoxicillin-Pot Clavulanate] Diarrhea   Bactrim [Sulfamethoxazole-Trimethoprim] Diarrhea and Itching   Calan [Verapamil] Diarrhea   Catapres [Clonidine Hcl] Other (See Comments)    Unknown reaction Not documented on MAR    Codeine Other (See Comments)    "Makes me out of my head, loopy"   Hydrocodone Other (See Comments)    "makes me go out of my head, loopy"   Other Other (See Comments)    Any type of narcotics Feels "loopy"    Sulfa Antibiotics Itching   Zestril [Lisinopril] Cough   Azulfidine [Sulfasalazine] Itching    Antimicrobials this admission: Cefepime 3/22 >>  Vancomycin 3/22 >>   Dose adjustments this admission:  Microbiology results:  Thank you for allowing pharmacy to be a part of this patient's care.  Jeneen Rinks [phone] [id number] PM

## 2022-11-03 NOTE — H&P (Signed)
History and Physical    Heidi Dominguez [id number] DOB: 1920-11-21 DOA: 11/03/2022  DOS: the patient was seen and examined on 11/03/2022  PCP: Heidi Manes, MD   Patient coming from: SNF  I have personally briefly reviewed patient's old medical records in [facility]  Heidi Dominguez, a 89 y/o with h/o bilateral shoulder replacement, previous right septic shoulder requiring antibiotic beads presents due to increased swelling of the right shoulder and pain with movement. She has not had a fever, rigors. She has a residual cough from a recent bout of RSV but otherwise has been doing well. Due to her symptoms she was transported to [facility] for evaluation.    ED Course: T 99.1 129/57  83  16. Patient with tender shoulder per EDP but in no distress. Lab: Na 122, glucose 307, Alb 9.1  T. Protein 6.1, lactic acid 3.3  WBC 15.5 with 84/5/6/1 ESR 49, BNP 224. CXR right basilar atelectasis. Mr. Heidi Dominguez for [facility] consulted - recommend IV abx and CT shoulder with possible aspiration if large effusion but no larger surgical intervention. TRH called to admit and continue medical management of septic arthritis, very mild acute on chronic HFpEF and hyponatremia.  Review of Systems:  Review of Systems  Constitutional:  Positive for fever. Negative for chills, malaise/fatigue and weight loss.  HENT:  Positive for hearing loss.   Eyes: Negative.   Respiratory:  Positive for cough. Negative for sputum production and wheezing.   Cardiovascular:  Positive for leg swelling. Negative for chest pain and palpitations.  Gastrointestinal: Negative.   Genitourinary: Negative.   Musculoskeletal:  Positive for joint pain.       Right shoulder pain  Skin: Negative.   Neurological: Negative.   Endo/Heme/Allergies: Negative.   Psychiatric/Behavioral: Negative.      Past Medical History:  Diagnosis Date   Acute pancreatitis    hx of    Arthritis    "qwhere" (07/04/2018)   Chronic back pain    "all  over" (07/04/2018)   Chronic diastolic CHF (congestive heart failure) (HCC)    Chronic kidney disease    stage  3 chronic kidney disease    Complication of anesthesia    "I have a hard time waking up"   Degenerative joint disease of shoulder region    Dry eye syndrome    Dyspnea    Family history of adverse reaction to anesthesia    "daughter has the shakes when she wakes up"   GERD (gastroesophageal reflux disease)    Heart murmur    High cholesterol    History of hiatal hernia    HOH (hard of hearing)    Hypertension    Impacted cerumen of both ears    hx of    Muscle weakness (generalized)    Osteoarthritis    Overactive bladder    Pancreatitis    hx of    Phlebitis    "BLE"   Rotator cuff tear    right    Tear of right supraspinatus tendon    Thrombocytopenia (HCC)    hx of    Type II diabetes mellitus (HCC)    Urinary tract infection    hx of    UTI (urinary tract infection) 09/12/2018   Xerosis cutis    hx of     Past Surgical History:  Procedure Laterality Date   ABDOMINAL AORTOGRAM W/LOWER EXTREMITY N/A 12/22/2020   Procedure: ABDOMINAL AORTOGRAM W/LOWER EXTREMITY;  Surgeon: Cherre Robins, MD;  Location: [place] [facility];  Service: Cardiovascular;  Laterality: N/A;   ABDOMINAL HYSTERECTOMY     BACK SURGERY     CATARACT EXTRACTION W/ INTRAOCULAR LENS  IMPLANT, BILATERAL Bilateral    CHOLECYSTECTOMY  2016   ERCP N/A 08/30/2018   Procedure: ENDOSCOPIC RETROGRADE CHOLANGIOPANCREATOGRAPHY (ERCP);  Surgeon: Clarene Essex, MD;  Location: [facility];  Service: Endoscopy;  Laterality: N/A;   FIXATION KYPHOPLASTY     INCISION AND DRAINAGE Right 11/08/2018   Procedure: INCISION AND DRAINAGE right shoulder, placement of antibiotic beads ;  Surgeon: [name], MD;  Location: [facility];  Service: Orthopedics;  Laterality: Right;   JOINT REPLACEMENT     LAPAROSCOPIC CHOLECYSTECTOMY SINGLE SITE WITH INTRAOPERATIVE CHOLANGIOGRAM N/A 04/11/2015   Procedure: LAPAROSCOPIC  LYSIS OF ADHESIONS, LAPAROSCOPIC CHOLECYSTECTOMY WITH INTRAOPERATIVE CHOLANGIOGRAM;  Surgeon:  Boston, MD;  Location: [facility];  Service: General;  Laterality: N/A;   PANCREATIC STENT PLACEMENT  08/30/2018   Procedure: PANCREATIC STENT PLACEMENT;  Surgeon: Clarene Essex, MD;  Location: [facility];  Service: Endoscopy;;   PERIPHERAL VASCULAR INTERVENTION  12/22/2020   Procedure: PERIPHERAL VASCULAR INTERVENTION;  Surgeon: Cherre Robins, MD;  Location: [place] [facility];  Service: Cardiovascular;;  left anterior tibial artery   REMOVAL OF STONES  08/30/2018   Procedure: REMOVAL OF STONES;  Surgeon: Clarene Essex, MD;  Location: [facility];  Service: Endoscopy;;   SHOULDER OPEN ROTATOR CUFF REPAIR Bilateral    SPHINCTEROTOMY  08/30/2018   Procedure: SPHINCTEROTOMY;  Surgeon: Clarene Essex, MD;  Location: [facility];  Service: Endoscopy;;   TOTAL KNEE ARTHROPLASTY Bilateral     Soc Hx - widowed. Has two children, 4 grandchildren, 9 great-grands, 2 great-great grands. She resides at [facility] .    reports that she has never smoked. She has never used smokeless tobacco. She reports that she does not drink alcohol and does not use drugs.  Allergies  Allergen Reactions   Augmentin [Amoxicillin-Pot Clavulanate] Diarrhea   Bactrim [Sulfamethoxazole-Trimethoprim] Diarrhea and Itching   Calan [Verapamil] Diarrhea   Catapres [Clonidine Hcl] Other (See Comments)    Unknown reaction Not documented on MAR    Codeine Other (See Comments)    "Makes me out of my head, loopy"   Hydrocodone Other (See Comments)    "makes me go out of my head, loopy"   Other Other (See Comments)    Any type of narcotics Feels "loopy"    Sulfa Antibiotics Itching   Zestril [Lisinopril] Cough   Azulfidine [Sulfasalazine] Itching    Family History  Problem Relation Age of Onset   Hypertension Mother    Diabetes Mellitus I Mother    Hypertension Father     Prior to Admission medications   Medication Sig  Start Date End Date Taking? Authorizing Provider  acetaminophen (TYLENOL) 325 MG tablet Take 650 mg by mouth 3 (three) times daily.    [provider]  albuterol (VENTOLIN HFA) 108 (90 Base) MCG/ACT inhaler Inhale 2 puffs into the lungs every 6 (six) hours as needed for wheezing or shortness of breath.    [provider]  amLODipine (NORVASC) 10 MG tablet Take 10 mg by mouth See admin instructions. 10 mg once daily in the morning. HOLD if SBP < 110.    [provider]  aspirin EC 81 MG tablet Take 162 mg by mouth at bedtime.    [provider]  budesonide (PULMICORT) 0.5 MG/2ML nebulizer solution Take 2 mLs (0.5 mg total) by nebulization in the morning and at  bedtime. 10/16/22   Cobb, Karie Schwalbe, NP  Cholecalciferol (VITAMIN D3) 25 MCG (1000 UT) capsule Take 1,000 Units by mouth every Monday.    [provider]  dextromethorphan-guaiFENesin (MUCINEX DM) 30-600 MG 12hr tablet Take 1 tablet by mouth 2 (two) times daily. 10/16/22   Cobb, Karie Schwalbe, NP  diclofenac Sodium (VOLTAREN ARTHRITIS PAIN) 1 % GEL Apply 2 g topically 3 (three) times daily. To both shoulders.    [provider]  furosemide (LASIX) 20 MG tablet Take 0.5 tablets (10 mg total) by mouth daily for 10 days. 10/26/22 11/05/22  Tacy Learn, PA-C  guaiFENesin (ROBITUSSIN) 100 MG/5ML liquid Take 10 mLs by mouth every 6 (six) hours as needed for cough or to loosen phlegm. 10/01/22   Barb Merino, MD  hydrochlorothiazide (HYDRODIURIL) 25 MG tablet Take 25 mg by mouth daily. 10/09/22   [provider]  hypromellose (GENTEAL SEVERE) 0.3 % GEL ophthalmic ointment Place 1 Application into both eyes 2 (two) times daily.    [provider]  ipratropium-albuterol (DUONEB) 0.5-2.5 (3) MG/3ML SOLN Take 3 mLs by nebulization 3 (three) times daily. 09/19/22   [provider]  Lactobacillus (PROBIOTIC ACIDOPHILUS) CAPS Take 1 capsule by mouth at bedtime.    [provider]  loratadine (CLARITIN) 10 MG tablet Take 10 mg by mouth at bedtime.    [provider]  losartan (COZAAR) 50 MG tablet Take 50 mg by mouth See admin instructions. 50 mg once daily in the morning. HOLD for SBP < 110.    [provider]  metFORMIN (GLUCOPHAGE) 500 MG tablet Take 0.5 tablets (250 mg total) by mouth 2 (two) times daily with a meal. 10/26/22 11/25/22  Tacy Learn, PA-C  Multiple Vitamins-Minerals (PRESERVISION AREDS) CAPS Take 1 capsule by mouth daily.    [provider]  OMEPRAZOLE PO Take 20 mg by mouth daily. Omeprazole 20 mg DR disintegrating tablet.    [provider]  ondansetron (ZOFRAN) 4 MG tablet Take 4 mg by mouth every 8 (eight) hours as needed for nausea or vomiting.    [provider]  Polyethyl Glycol-Propyl Glycol (LUBRICANT EYE DROPS) 0.4-0.3 % SOLN Place 2 drops into both eyes daily.    [provider]  polyethylene glycol powder (GLYCOLAX/MIRALAX) 17 GM/SCOOP powder Take 17 g by mouth at bedtime.    [provider]    Physical Exam: Vitals:   11/03/22 1130 11/03/22 1345 11/03/22 1521 11/03/22 1529  BP:  (!) 119/50 (!) 119/50   Pulse:  70 79   Resp:  19 (!) 21   Temp: 99.1 F (37.3 C)   98.9 F (37.2 C)  TempSrc: Rectal   Oral  SpO2:  92% 98%     Physical Exam Vitals and nursing note reviewed.  Constitutional:      General: She is not in acute distress.    Appearance: She is obese. She is not ill-appearing.     Comments: Looks great for a 89 y/o  HENT:     Head: Normocephalic and atraumatic.     Mouth/Throat:     Mouth: Mucous membranes are moist.     Comments: Edentulous with full dentures Eyes:     Extraocular Movements: Extraocular movements intact.     Conjunctiva/sclera: Conjunctivae normal.     Pupils: Pupils are equal, round, and reactive to light.  Cardiovascular:     Rate and Rhythm: Normal rate and regular rhythm.     Pulses: Normal pulses.  Heart sounds: Murmur  heard.     No friction rub. No gallop.     Comments: III/VI holosystolic mm best at RSB, present at LSB, no heave or thrill.  Pulmonary:     Effort: Pulmonary effort is normal.     Breath sounds: Normal breath sounds. No wheezing or rales.  Abdominal:     General: Bowel sounds are normal. There is no distension.     Palpations: Abdomen is soft.     Tenderness: There is no abdominal tenderness. There is no rebound.  Musculoskeletal:        General: Normal range of motion.     Cervical back: Normal range of motion and neck supple. No rigidity.     Right lower leg: Edema present.     Left lower leg: Edema present.     Comments: Wearing knee high support hose  Lymphadenopathy:     Cervical: No cervical adenopathy.  Skin:    General: Skin is warm and dry.     Coloration: Skin is not jaundiced.     Findings: No bruising.     Comments: Small erythematous macular lesion right shin. Hypertrophic toenails with great toenail left foot causing an erosion of the adjacent toe but no frank infection  Neurological:     General: No focal deficit present.     Mental Status: She is alert and oriented to person, place, and time. Mental status is at baseline.     Cranial Nerves: No cranial nerve deficit.     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        Behavior: Behavior normal.      Labs on Admission: I have personally reviewed following labs and imaging studies  CBC: Recent Labs  Lab 11/03/22 1136  WBC 15.5*  NEUTROABS 13.1*  HGB 11.2*  HCT 34.3*  MCV 84.7  PLT 0000000   Basic Metabolic Panel: Recent Labs  Lab 11/03/22 1136  NA 122*  K 5.1  CL 84*  CO2 27  GLUCOSE 307*  BUN 17  CREATININE 0.75  CALCIUM 8.4*   GFR: Estimated Creatinine Clearance: 38.7 mL/min (by C-G formula based on SCr of 0.75 mg/dL). Liver Function Tests: Recent Labs  Lab 11/03/22 1136  AST 23  ALT 11  ALKPHOS 87  BILITOT 0.5   PROT 6.1*  ALBUMIN 1.9*   No results for input(s): "LIPASE", "AMYLASE" in the last 168 hours. No results for input(s): "AMMONIA" in the last 168 hours. Coagulation Profile: No results for input(s): "INR", "PROTIME" in the last 168 hours. Cardiac Enzymes: No results for input(s): "CKTOTAL", "CKMB", "CKMBINDEX", "TROPONINI" in the last 168 hours. BNP (last 3 results) Recent Labs    09/27/22 1218  PROBNP 77.0   HbA1C: No results for input(s): "HGBA1C" in the last 72 hours. CBG: No results for input(s): "GLUCAP" in the last 168 hours. Lipid Profile: No results for input(s): "CHOL", "HDL", "LDLCALC", "TRIG", "CHOLHDL", "LDLDIRECT" in the last 72 hours. Thyroid Function Tests: No results for input(s): "TSH", "T4TOTAL", "FREET4", "T3FREE", "THYROIDAB" in the last 72 hours. Anemia Panel: No results for input(s): "VITAMINB12", "FOLATE", "FERRITIN", "TIBC", "IRON", "RETICCTPCT" in the last 72 hours. Urine analysis:    Component Value Date/Time   COLORURINE STRAW (A) 10/26/2022 0035   APPEARANCEUR CLEAR 10/26/2022 0035   LABSPEC 1.015 10/26/2022 0035   PHURINE 5.0 10/26/2022 0035   GLUCOSEU >=500 (A) 10/26/2022 0035  HGBUR NEGATIVE 10/26/2022 0035   BILIRUBINUR NEGATIVE 10/26/2022 0035   KETONESUR NEGATIVE 10/26/2022 0035   PROTEINUR NEGATIVE 10/26/2022 0035   UROBILINOGEN 1.0 04/07/2015 2312   NITRITE NEGATIVE 10/26/2022 0035   LEUKOCYTESUR NEGATIVE 10/26/2022 0035    Radiological Exams on Admission: I have personally reviewed images CT SHOULDER RIGHT W CONTRAST  Result Date: 11/03/2022 CLINICAL DATA:  Septic arthritis suspected, shoulder, xray done. Leukocytosis and elevated serum inflammatory markers. EXAM: CT OF THE UPPER RIGHT EXTREMITY WITH CONTRAST TECHNIQUE: Multidetector CT imaging of the upper right extremity was performed according to the standard protocol following intravenous contrast administration. RADIATION DOSE REDUCTION: This exam was performed according to the  departmental dose-optimization program which includes automated exposure control, adjustment of the mA and/or kV according to patient size and/or use of iterative reconstruction technique. CONTRAST:  39mL OMNIPAQUE IOHEXOL 350 MG/ML SOLN COMPARISON:  X-ray 11/03/2022, CT 05/27/2021 FINDINGS: Bones/Joint/Cartilage Long stem right humeral prosthesis. The inferior tip of the prosthesis is not included within the field of view. Chronic superomedial migration of the humeral head component with extensive osseous remodeling of the scapula and distal clavicle. Chronic osteolysis involving the proximal humeral diaphysis, not significantly progressed. No acute fracture. Os acromiale. Ligaments Suboptimally assessed by CT. Muscles and Tendons Findings of chronic rotator cuff tears with chronic muscle atrophy. Soft tissues Large peripherally enhancing periprosthetic fluid collection surrounding the proximal right humerus measuring approximately 14 x 5 cm in transaxial dimension. Loculated component at the anterior shoulder extends approximately 10 cm in craniocaudal dimension. Collection closely approximates the skin surface at the anterior aspect of the shoulder. Loculated component at the posterior aspect of the shoulder measures approximately 8 cm in craniocaudal dimension. Mildly prominent right axillary lymph nodes are likely reactive. Small layering right-sided pleural effusion. Multinodular goiter, similar to the previous study. In the setting of significant comorbidities or limited life expectancy, no follow-up recommended (ref: J Am Coll Radiol. 2015 Feb;12(2): 143-50). IMPRESSION: 1. Large peripherally-enhancing periprosthetic fluid collection surrounding the proximal right humerus measuring up to 14 cm in maximal dimension. Findings are most concerning for infected fluid collection/abscess given the patient's history. Aspiration and joint fluid analysis is recommended. 2. Chronic superomedial migration of the humeral  head component with extensive osseous remodeling of the scapula and distal clavicle. Chronic osteolysis involving the proximal humeral diaphysis, not significantly progressed. 3. Small layering right-sided pleural effusion. Electronically Signed   By: Davina Poke D.O.   On: 11/03/2022 15:09   VAS [place] LOWER EXTREMITY VENOUS (DVT) (7a-7p)  Result Date: 11/03/2022  Lower Venous DVT Study Patient Name:  JANIL ZELLER  Date of Exam:   11/03/2022 Medical Rec #: [id number]        Accession #:    [id number] Date of Birth: 1921-03-25        Patient Gender: F Patient Age:   41 years Exam Location:  [facility] Procedure:      VAS [place] LOWER EXTREMITY VENOUS (DVT) Referring Phys: ADAM CURATOLO --------------------------------------------------------------------------------  Indications: Pain, Edema, and Chronic swelling.  Risk Factors: Surgery Lower ext bypass graft, left leg stenting. Comparison Study: 03/16/21 - Negative LE venous Performing Technologist: Velva Harman Sturdivant RDMS, RVT  Examination Guidelines: A complete evaluation includes B-mode imaging, spectral Doppler, color Doppler, and power Doppler as needed of all accessible portions of each vessel. Bilateral testing is considered an integral part of a complete examination. Limited examinations for reoccurring indications may be performed as noted. The reflux portion of the exam is performed with the patient  in reverse Trendelenburg.  +---------+---------------+---------+-----------+----------+-------------------+ RIGHT    CompressibilityPhasicitySpontaneityPropertiesThrombus Aging      +---------+---------------+---------+-----------+----------+-------------------+ CFV      Full           Yes      Yes                                      +---------+---------------+---------+-----------+----------+-------------------+ SFJ      Full                                                              +---------+---------------+---------+-----------+----------+-------------------+ FV Prox  Full                                                             +---------+---------------+---------+-----------+----------+-------------------+ FV Mid   Full                                                             +---------+---------------+---------+-----------+----------+-------------------+ FV DistalFull                                                             +---------+---------------+---------+-----------+----------+-------------------+ PFV      Full                                                             +---------+---------------+---------+-----------+----------+-------------------+ POP      Full           Yes      Yes                                      +---------+---------------+---------+-----------+----------+-------------------+ PTV      Full                                                             +---------+---------------+---------+-----------+----------+-------------------+ PERO                                                  Not well visualized +---------+---------------+---------+-----------+----------+-------------------+   Right Technical Findings: Bilateral  calf veins were not well visualized. Cannot exclude chronic DVT in the calf veins.  +---------+---------------+---------+-----------+----------+-------------------+ LEFT     CompressibilityPhasicitySpontaneityPropertiesThrombus Aging      +---------+---------------+---------+-----------+----------+-------------------+ CFV      Full           Yes      Yes                                      +---------+---------------+---------+-----------+----------+-------------------+ SFJ      Full                                                             +---------+---------------+---------+-----------+----------+-------------------+ FV Prox  Full                                                              +---------+---------------+---------+-----------+----------+-------------------+ FV Mid   Full                                                             +---------+---------------+---------+-----------+----------+-------------------+ FV DistalFull                                                             +---------+---------------+---------+-----------+----------+-------------------+ PFV      Full                                                             +---------+---------------+---------+-----------+----------+-------------------+ POP      Full           Yes      Yes                                      +---------+---------------+---------+-----------+----------+-------------------+ PTV      Full                                                             +---------+---------------+---------+-----------+----------+-------------------+ PERO  Not well visualized +---------+---------------+---------+-----------+----------+-------------------+     Summary: BILATERAL: - No evidence of acute deep vein thrombosis seen in the lower extremities, bilaterally. -No evidence of popliteal cyst, bilaterally. RIGHT: -   *See table(s) above for measurements and observations.    Preliminary    DG Chest 2 View  Result Date: 11/03/2022 CLINICAL DATA:  Chest pain. EXAM: CHEST - 2 VIEW COMPARISON:  October 25, 2022. FINDINGS: Stable cardiomegaly. Large hiatal hernia is noted. Minimal right basilar subsegmental atelectasis is noted. Status post bilateral shoulder arthroplasties. IMPRESSION: Minimal right basilar subsegmental atelectasis. Large hiatal hernia. Electronically Signed   By: Marijo Conception M.D.   On: 11/03/2022 12:06   DG Shoulder Right  Result Date: 11/03/2022 CLINICAL DATA:  Pain EXAM: RIGHT SHOULDER - 3 VIEW COMPARISON:  None Available. FINDINGS: There is a right shoulder prosthesis.  There appears to be grossly anatomic alignment. There is absence of the acromial humeral distance indicating the presence of rotator cuff defect. No acute fracture, dislocation or subluxation identified. IMPRESSION: Status post right shoulder arthroplasty. No acute osseous abnormalities are seen. Electronically Signed   By: Sammie Bench M.D.   On: 11/03/2022 12:03    EKG: I have personally reviewed EKG: sinus rhytm w/o acute changes  Assessment/Plan Principal Problem:   Septic arthritis of shoulder, right (HCC) Active Problems:   Hyponatremia   Essential hypertension   Diabetes mellitus type 2, controlled (HCC)   Chronic diastolic CHF (congestive heart failure) (HCC)   End of life care    Assessment and Plan: * Septic arthritis of shoulder, right (HCC) >>ASSESSMENT AND PLAN FOR INFECTION OF SHOULDER ([place]) WRITTEN ON 11/03/2022  3:15 PM BY Ieasha Boerema E, MD  H/o bilateral shoulder replacement and prior infection right prosthetic shoulder. Per daughter she is not a candidate for removal of hardware. Patient with swelling and pain, leukocytosis. Mr. Dellis Filbert, [place] for [facility] has been consulted: recommended CT shoulder and IV abx.  Plan  Continue cefepime and Vanco pending culture results and CT  Ortho to manage care of shoulder  Hyponatremia Patient with prior problems with low sodium. Possible etiology diuretics: she was taking HCTZ and Lasix. She is asymptomatic  Plan D/c HCTZ  Continue Lasix  NS @ 50 cc/hr - careful replenishment in setting of HFpEF  F/u Bmet  End of life care Discussed Code status with patient with daughter present. Ms. Fatemi clearly stated that in the event of a cardiac or respiratory arrest she would NOT want a resuscitation effort or intubation. Daughter agrees  Chronic diastolic CHF (congestive heart failure) ([place]) Last Echo 04/09/2015 with EF 60-65%, Grade III DD. She has been managed with HCTZ and low dose lasix. She has no respiratory c/o other than  cough. Mild to moderate LE edema present. CXR read out as right basilar atelectasis w/o pulmonary edem. BNP mildly elevated at 224, Cr 1.25  Plan D/c HCTZ  Lasix 40 mg daily  Continue all cardiac meds  Diabetes mellitus type 2, controlled ([place]) Last A1C 10/02/22 7.5%. She does [place] inhalational steroid product with possibly some impact. She takes metformin and before meal insulin on sliding scale.  Plan  Continue metformin  SS coverage.  Essential hypertension BP stable at admission as is renal function.  Plan Continue home regimen       DVT prophylaxis: SQ Heparin Code Status: DNR/DNI(Do NOT Intubate) Family Communication: daughter present during exam and agrees to Tx plan and code status  Disposition Plan: return to [facility] when stable  Consults called: Ortho - Mr. Dellis Filbert for [facility]  Admission status: Inpatient, Med-Surg   Adella Hare, MD [facility] 11/03/2022, 3:35 PM

## 2022-11-03 NOTE — Progress Notes (Signed)
[facility] following for sepsis protocol. 

## 2022-11-03 NOTE — Assessment & Plan Note (Signed)
BP stable at admission as is renal function.  Plan Continue home regimen

## 2022-11-03 NOTE — Consult Note (Signed)
Reason for Consult:Right shoulder pain Referring Physician: Lennice Sites Time called: [id number] Time at bedside: Heidi Dominguez is an 89 y.o. female.  HPI: [name] began to have increased right shoulder pain and swelling in the last 1-2d. She has a long hx/o chronic right shoulder infection that flares on occasion. She also had a low-grade fever. She was sent over from the SNF where she resides 2/2 that as well as a low Na.  Past Medical History:  Diagnosis Date   Acute pancreatitis    hx of    Arthritis    "qwhere" (07/04/2018)   Chronic back pain    "all over" (07/04/2018)   Chronic diastolic CHF (congestive heart failure) (HCC)    Chronic kidney disease    stage  3 chronic kidney disease    Complication of anesthesia    "I have a hard time waking up"   Degenerative joint disease of shoulder region    Dry eye syndrome    Dyspnea    Family history of adverse reaction to anesthesia    "daughter has the shakes when she wakes up"   GERD (gastroesophageal reflux disease)    Heart murmur    High cholesterol    History of hiatal hernia    HOH (hard of hearing)    Hypertension    Impacted cerumen of both ears    hx of    Muscle weakness (generalized)    Osteoarthritis    Overactive bladder    Pancreatitis    hx of    Phlebitis    "BLE"   Rotator cuff tear    right    Tear of right supraspinatus tendon    Thrombocytopenia (HCC)    hx of    Type II diabetes mellitus (HCC)    Urinary tract infection    hx of    UTI (urinary tract infection) 09/12/2018   Xerosis cutis    hx of     Past Surgical History:  Procedure Laterality Date   ABDOMINAL AORTOGRAM W/LOWER EXTREMITY N/A 12/22/2020   Procedure: ABDOMINAL AORTOGRAM W/LOWER EXTREMITY;  Surgeon: Cherre Robins, MD;  Location: [place] [facility];  Service: Cardiovascular;  Laterality: N/A;   ABDOMINAL HYSTERECTOMY     BACK SURGERY     CATARACT EXTRACTION W/ INTRAOCULAR LENS  IMPLANT, BILATERAL Bilateral     CHOLECYSTECTOMY  2016   ERCP N/A 08/30/2018   Procedure: ENDOSCOPIC RETROGRADE CHOLANGIOPANCREATOGRAPHY (ERCP);  Surgeon: Clarene Essex, MD;  Location: [facility];  Service: Endoscopy;  Laterality: N/A;   FIXATION KYPHOPLASTY     INCISION AND DRAINAGE Right 11/08/2018   Procedure: INCISION AND DRAINAGE right shoulder, placement of antibiotic beads ;  Surgeon: [name], MD;  Location: [facility];  Service: Orthopedics;  Laterality: Right;   JOINT REPLACEMENT     LAPAROSCOPIC CHOLECYSTECTOMY SINGLE SITE WITH INTRAOPERATIVE CHOLANGIOGRAM N/A 04/11/2015   Procedure: LAPAROSCOPIC LYSIS OF ADHESIONS, LAPAROSCOPIC CHOLECYSTECTOMY WITH INTRAOPERATIVE CHOLANGIOGRAM;  Surgeon:  Boston, MD;  Location: [facility];  Service: General;  Laterality: N/A;   PANCREATIC STENT PLACEMENT  08/30/2018   Procedure: PANCREATIC STENT PLACEMENT;  Surgeon: Clarene Essex, MD;  Location: [facility];  Service: Endoscopy;;   PERIPHERAL VASCULAR INTERVENTION  12/22/2020   Procedure: PERIPHERAL VASCULAR INTERVENTION;  Surgeon: Cherre Robins, MD;  Location: [place] [facility];  Service: Cardiovascular;;  left anterior tibial artery   REMOVAL OF STONES  08/30/2018   Procedure: REMOVAL OF STONES;  Surgeon: Clarene Essex, MD;  Location:  [facility];  Service: Endoscopy;;   SHOULDER OPEN ROTATOR CUFF REPAIR Bilateral    SPHINCTEROTOMY  08/30/2018   Procedure: SPHINCTEROTOMY;  Surgeon: Clarene Essex, MD;  Location: [facility];  Service: Endoscopy;;   TOTAL KNEE ARTHROPLASTY Bilateral     Family History  Problem Relation Age of Onset   Hypertension Mother    Diabetes Mellitus I Mother    Hypertension Father     Social History:  reports that she has never smoked. She has never used smokeless tobacco. She reports that she does not drink alcohol and does not use drugs.  Allergies:  Allergies  Allergen Reactions   Augmentin [Amoxicillin-Pot Clavulanate] Diarrhea   Bactrim [Sulfamethoxazole-Trimethoprim] Diarrhea and Itching    Calan [Verapamil] Diarrhea   Catapres [Clonidine Hcl] Other (See Comments)    Unknown reaction Not documented on MAR    Codeine Other (See Comments)    "Makes me out of my head, loopy"   Hydrocodone Other (See Comments)    "makes me go out of my head, loopy"   Other Other (See Comments)    Any type of narcotics Feels "loopy"    Sulfa Antibiotics Itching   Zestril [Lisinopril] Cough   Azulfidine [Sulfasalazine] Itching    Medications: I have reviewed the patient's current medications.  No results found for this or any previous visit (from the past 48 hour(s)).  No results found.  Review of Systems  Constitutional:  Positive for fever. Negative for chills and diaphoresis.  HENT:  Negative for ear discharge, ear pain, hearing loss and tinnitus.   Eyes:  Negative for photophobia and pain.  Respiratory:  Negative for cough and shortness of breath.   Cardiovascular:  Negative for chest pain.  Gastrointestinal:  Negative for abdominal pain, nausea and vomiting.  Genitourinary:  Negative for dysuria, flank pain, frequency and urgency.  Musculoskeletal:  Positive for arthralgias (Right shoulder). Negative for back pain, myalgias and neck pain.  Neurological:  Negative for dizziness and headaches.  Hematological:  Does not bruise/bleed easily.  Psychiatric/Behavioral:  The patient is not nervous/anxious.    Blood pressure (!) 129/57, pulse 83, temperature 99.1 F (37.3 C), temperature source Rectal, resp. rate 16, SpO2 93 %. Physical Exam Constitutional:      General: She is not in acute distress.    Appearance: She is well-developed. She is not diaphoretic.  HENT:     Head: Normocephalic and atraumatic.  Eyes:     General: No scleral icterus.       Right eye: No discharge.        Left eye: No discharge.     Conjunctiva/sclera: Conjunctivae normal.  Cardiovascular:     Rate and Rhythm: Normal rate and regular rhythm.  Pulmonary:     Effort: Pulmonary effort is normal. No  respiratory distress.  Musculoskeletal:     Cervical back: Normal range of motion.     Comments: Right shoulder, elbow, wrist, digits- no skin wounds, mod TTP upper arm, no instability, no blocks to motion  Sens  Ax/R/M/U intact  Mot   Ax/ R/ PIN/ M/ AIN/ U intact  Rad 2+  Skin:    General: Skin is warm and dry.  Neurological:     Mental Status: She is alert.  Psychiatric:        Mood and Affect: Mood normal.        Behavior: Behavior normal.     Assessment/Plan: Right shoulder pain -- Plan to admit to medicine for IV abx. Will get CT  to make sure no large fluid collection. If there is would recommend IR drainage as pt's age and comorbidities make it unwise for a large formal surgery.    Lisette Abu, PA-C Orthopedic Surgery [phone] 11/03/2022, 11:54 AM

## 2022-11-03 NOTE — ED Notes (Signed)
Pt and family endorses swelling to shoulder.  Family states that there is an Ecoli infection from a shoulder replacement.

## 2022-11-03 NOTE — Assessment & Plan Note (Signed)
Last A1C 10/02/22 7.5%. She does [place] inhalational steroid product with possibly some impact. She takes metformin and before meal insulin on sliding scale.  Plan  Continue metformin  SS coverage.

## 2022-11-03 NOTE — Assessment & Plan Note (Signed)
Patient with prior problems with low sodium. Possible etiology diuretics: she was taking HCTZ and Lasix. She is asymptomatic  Plan D/c HCTZ  Continue Lasix  NS @ 50 cc/hr - careful replenishment in setting of HFpEF  F/u Bmet

## 2022-11-03 NOTE — Progress Notes (Signed)
Venous duplex lower ext  has been completed. Refer to [facility] under chart review to view preliminary results.   11/03/2022  1:35 PM Talayia Hjort, Bonnye Fava

## 2022-11-03 NOTE — Assessment & Plan Note (Signed)
>>  ASSESSMENT AND PLAN FOR INFECTION OF SHOULDER ([name]) WRITTEN ON 11/03/2022  3:15 PM BY Jiro Kiester E, MD  H/o bilateral shoulder replacement and prior infection right prosthetic shoulder. Per daughter she is not a candidate for removal of hardware. Patient with swelling and pain, leukocytosis. Mr. Heidi Dominguez, [place] for [facility] has been consulted: recommended CT shoulder and IV abx.  Plan  Continue cefepime and Vanco pending culture results and CT  Ortho to manage care of shoulder

## 2022-11-03 NOTE — Assessment & Plan Note (Signed)
Last Echo 04/09/2015 with EF 60-65%, Grade III DD. She has been managed with HCTZ and low dose lasix. She has no respiratory c/o other than cough. Mild to moderate LE edema present. CXR read out as right basilar atelectasis w/o pulmonary edem. BNP mildly elevated at 224, Cr 1.25  Plan D/c HCTZ  Lasix 40 mg daily  Continue all cardiac meds

## 2022-11-03 NOTE — Assessment & Plan Note (Signed)
Discussed Code status with patient with daughter present. Ms. Guck clearly stated that in the event of a cardiac or respiratory arrest she would NOT want a resuscitation effort or intubation. Daughter agrees

## 2022-11-03 NOTE — ED Notes (Signed)
ED TO INPATIENT HANDOFF REPORT  ED Nurse Name and Phone #: Iona Coach Name/Age/Gender [name] 89 y.o. female Room/Bed: 030C/030C  Code Status   Code Status: DNR  Home/SNF/Other [place] Patient oriented to: self, place, time, and situation Is this baseline? Yes   Triage Complete: Triage complete  Chief Complaint Septic arthritis of shoulder, right ([place]) [M00.9]  Triage Note Pt BIB [facility] from [facility] for [place] labs.  EMS reports low Na and elevated WBC.  EMS endorsed a hx of MRSA in right arm and complains of right arm pain.   Pt A&O, HOH, VSS   Allergies Allergies  Allergen Reactions   Augmentin [Amoxicillin-Pot Clavulanate] Diarrhea   Bactrim [Sulfamethoxazole-Trimethoprim] Diarrhea and Itching   Calan [Verapamil] Diarrhea   Catapres [Clonidine Hcl] Other (See Comments)    Unknown reaction Not documented on MAR    Codeine Other (See Comments)    "Makes me out of my head, loopy"   Hydrocodone Other (See Comments)    "makes me go out of my head, loopy"   Other Other (See Comments)    Any type of narcotics Feels "loopy"    Sulfa Antibiotics Itching   Zestril [Lisinopril] Cough   Azulfidine [Sulfasalazine] Itching    Level of Care/Admitting Diagnosis ED Disposition     ED Disposition  Admit   Condition  --   [place]: [place] [[id number]]  Level of Care: Med-Surg [16]  May admit patient to [facility] or [facility] if equivalent level of care is available:: No  Covid Evaluation: Asymptomatic - no recent exposure (last 10 days) testing not required  Diagnosis: [id number]  Admitting Physician: Neena Rhymes [[id number]]  Attending Physician: Neena Rhymes [id number]  Certification:: I certify this patient will need inpatient services for at least 2 midnights  Estimated Length of Stay: 4          B Medical/Surgery History Past Medical History:  Diagnosis  Date   Acute pancreatitis    hx of    Arthritis    "qwhere" (07/04/2018)   Chronic back pain    "all over" (07/04/2018)   Chronic diastolic CHF (congestive heart failure) (HCC)    Chronic kidney disease    stage  3 chronic kidney disease    Complication of anesthesia    "I have a hard time waking up"   Degenerative joint disease of shoulder region    Dry eye syndrome    Dyspnea    Family history of adverse reaction to anesthesia    "daughter has the shakes when she wakes up"   GERD (gastroesophageal reflux disease)    Heart murmur    High cholesterol    History of hiatal hernia    HOH (hard of hearing)    Hypertension    Impacted cerumen of both ears    hx of    Muscle weakness (generalized)    Osteoarthritis    Overactive bladder    Pancreatitis    hx of    Phlebitis    "BLE"   Rotator cuff tear    right    Tear of right supraspinatus tendon    Thrombocytopenia (HCC)    hx of    Type II diabetes mellitus ([place])    Urinary tract infection    hx of    UTI (urinary tract infection) 09/12/2018   Xerosis cutis    hx of  Past Surgical History:  Procedure Laterality Date   ABDOMINAL AORTOGRAM W/LOWER EXTREMITY N/A 12/22/2020   Procedure: ABDOMINAL AORTOGRAM W/LOWER EXTREMITY;  Surgeon: Cherre Robins, MD;  Location: [facility];  Service: Cardiovascular;  Laterality: N/A;   ABDOMINAL HYSTERECTOMY     BACK SURGERY     CATARACT EXTRACTION W/ INTRAOCULAR LENS  IMPLANT, BILATERAL Bilateral    CHOLECYSTECTOMY  2016   ERCP N/A 08/30/2018   Procedure: ENDOSCOPIC RETROGRADE CHOLANGIOPANCREATOGRAPHY (ERCP);  Surgeon: Clarene Essex, MD;  Location: [facility];  Service: Endoscopy;  Laterality: N/A;   FIXATION KYPHOPLASTY     INCISION AND DRAINAGE Right 11/08/2018   Procedure: INCISION AND DRAINAGE right shoulder, placement of antibiotic beads ;  Surgeon: [name], MD;  Location: [facility];  Service: Orthopedics;  Laterality: Right;   JOINT REPLACEMENT      LAPAROSCOPIC CHOLECYSTECTOMY SINGLE SITE WITH INTRAOPERATIVE CHOLANGIOGRAM N/A 04/11/2015   Procedure: LAPAROSCOPIC LYSIS OF ADHESIONS, LAPAROSCOPIC CHOLECYSTECTOMY WITH INTRAOPERATIVE CHOLANGIOGRAM;  Surgeon: Michael Boston, MD;  Location: [facility];  Service: General;  Laterality: N/A;   PANCREATIC STENT PLACEMENT  08/30/2018   Procedure: PANCREATIC STENT PLACEMENT;  Surgeon: Clarene Essex, MD;  Location: [facility];  Service: Endoscopy;;   PERIPHERAL VASCULAR INTERVENTION  12/22/2020   Procedure: PERIPHERAL VASCULAR INTERVENTION;  Surgeon: Cherre Robins, MD;  Location: [facility];  Service: Cardiovascular;;  left anterior tibial artery   REMOVAL OF STONES  08/30/2018   Procedure: REMOVAL OF STONES;  Surgeon: Clarene Essex, MD;  [facility];  Service: Endoscopy;;   SHOULDER OPEN ROTATOR CUFF REPAIR Bilateral    SPHINCTEROTOMY  08/30/2018   Procedure: SPHINCTEROTOMY;  Surgeon: Clarene Essex, MD;  Location[facility];  Service: Endoscopy;;   TOTAL KNEE ARTHROPLASTY Bilateral      A IV Location/Drains/Wounds Patient Lines/Drains/Airways Status     Active Line/Drains/Airways     Name Placement date Placement time Site Days   Peripheral IV 11/03/22 20 G Left Antecubital 11/03/22  1143  Antecubital  less than 1   External Urinary Catheter 11/03/22  1332  --  less than 1            Intake/Output Last 24 hours  Intake/Output Summary (Last 24 hours) at 11/03/2022 1526 Last data filed at 11/03/2022 1521 Gross per 24 hour  Intake 500 ml  Output --  Net 500 ml    Labs/Imaging Results for orders placed or performed during the hospital encounter of 11/03/22 (from the past 48 hour(s))  Troponin I (High Sensitivity)     Status: None   Collection Time: 11/03/22 11:33 AM  Result Value Ref Range   Troponin I (High Sensitivity) 7 <18 ng/L    Comment: (NOTE) Elevated high sensitivity troponin I (hsTnI) values and significant  changes across serial measurements may suggest ACS  but many other  chronic and acute conditions are known to elevate hsTnI results.  Refer to the "Links" section for chest pain algorithms and additional  guidance. Performed at [facility], [place] [address], [place], [place] [address]   Brain natriuretic peptide     Status: Abnormal   Collection Time: 11/03/22 11:33 AM  Result Value Ref Range   B Natriuretic Peptide 224.2 (H) 0.0 - 100.0 pg/mL    Comment: Performed at [facility] [address], [place], [place] [address]  C-reactive protein     Status: Abnormal   Collection Time: 11/03/22 11:36 AM  Result Value Ref Range   CRP 16.0 (H) <  1.0 mg/dL    Comment: Performed at [facility], [place] [address], [place], [place] [address]  Sedimentation rate     Status: Abnormal   Collection Time: 11/03/22 11:36 AM  Result Value Ref Range   Sed Rate 49 (H) 0 - 22 mm/hr    Comment: Performed at [facility] [address], [place], [place] [address]  CBC with Differential     Status: Abnormal   Collection Time: 11/03/22 11:36 AM  Result Value Ref Range   WBC 15.5 (H) 4.0 - 10.5 K/uL   RBC 4.05 3.87 - 5.11 MIL/uL   Hemoglobin 11.2 (L) 12.0 - 15.0 g/dL   HCT 34.3 (L) 36.0 - 46.0 %   MCV 84.7 80.0 - 100.0 fL   MCH 27.7 26.0 - 34.0 pg   MCHC 32.7 30.0 - 36.0 g/dL   RDW 15.3 11.5 - 15.5 %   Platelets 370 150 - 400 K/uL   nRBC 0.0 0.0 - 0.2 %   Neutrophils Relative % 84 %   Neutro Abs 13.1 (H) 1.7 - 7.7 K/uL   Lymphocytes Relative 5 %   Lymphs Abs 0.8 0.7 - 4.0 K/uL   Monocytes Relative 8 %   Monocytes Absolute 1.2 (H) 0.1 - 1.0 K/uL   Eosinophils Relative 1 %   Eosinophils Absolute 0.1 0.0 - 0.5 K/uL   Basophils Relative 0 %   Basophils Absolute 0.0 0.0 - 0.1 K/uL   Immature Granulocytes 2 %   Abs Immature Granulocytes 0.26 (H) 0.00 - 0.07 K/uL    Comment: Performed at [facility] [address], [place], [place] [address]  Comprehensive metabolic panel     Status: Abnormal   Collection Time: 11/03/22  11:36 AM  Result Value Ref Range   Sodium 122 (L) 135 - 145 mmol/L   Potassium 5.1 3.5 - 5.1 mmol/L   Chloride 84 (L) 98 - 111 mmol/L   CO2 27 22 - 32 mmol/L   Glucose, Bld 307 (H) 70 - 99 mg/dL    Comment: Glucose reference range applies only to samples taken after fasting for at least 8 hours.   BUN 17 8 - 23 mg/dL   Creatinine, Ser 0.75 0.44 - 1.00 mg/dL   Calcium 8.4 (L) 8.9 - 10.3 mg/dL   Total Protein 6.1 (L) 6.5 - 8.1 g/dL   Albumin 1.9 (L) 3.5 - 5.0 g/dL   AST 23 15 - 41 U/L   ALT 11 0 - 44 U/L   Alkaline Phosphatase 87 38 - 126 U/L   Total Bilirubin 0.5 0.3 - 1.2 mg/dL   GFR, Estimated >60 >60 mL/min    Comment: (NOTE) Calculated using the CKD-EPI Creatinine Equation (2021)    Anion gap 11 5 - 15    Comment: Performed at [facility], [place] [address] [place], [place] [address]  Lactic acid, plasma     Status: Abnormal   Collection Time: 11/03/22 11:36 AM  Result Value Ref Range   Lactic Acid, Venous 3.3 (HH) 0.5 - 1.9 mmol/L    Comment: CRITICAL RESULT CALLED TO, READ BACK BY AND VERIFIED WITH J,Jimmey Hengel RN @1313  11/03/22 E,BENTON Performed at [facility], [place] [address] [place], [place] [address]    CT SHOULDER RIGHT W CONTRAST  Result Date: 11/03/2022 CLINICAL DATA:  Septic arthritis suspected, shoulder, xray done. Leukocytosis and elevated serum inflammatory markers. EXAM: CT OF THE UPPER RIGHT EXTREMITY WITH CONTRAST TECHNIQUE: Multidetector CT imaging of  the upper right extremity was performed according to the standard protocol following intravenous contrast administration. RADIATION DOSE REDUCTION: This exam was performed according to the departmental dose-optimization program which includes automated exposure control, adjustment of the mA and/or kV according to patient size and/or use of iterative reconstruction technique. CONTRAST:  47mL OMNIPAQUE IOHEXOL 350 MG/ML SOLN COMPARISON:  X-ray 11/03/2022, CT 05/27/2021 FINDINGS: Bones/Joint/Cartilage Long stem  right humeral prosthesis. The inferior tip of the prosthesis is not included within the field of view. Chronic superomedial migration of the humeral head component with extensive osseous remodeling of the scapula and distal clavicle. Chronic osteolysis involving the proximal humeral diaphysis, not significantly progressed. No acute fracture. Os acromiale. Ligaments Suboptimally assessed by CT. Muscles and Tendons Findings of chronic rotator cuff tears with chronic muscle atrophy. Soft tissues Large peripherally enhancing periprosthetic fluid collection surrounding the proximal right humerus measuring approximately 14 x 5 cm in transaxial dimension. Loculated component at the anterior shoulder extends approximately 10 cm in craniocaudal dimension. Collection closely approximates the skin surface at the anterior aspect of the shoulder. Loculated component at the posterior aspect of the shoulder measures approximately 8 cm in craniocaudal dimension. Mildly prominent right axillary lymph nodes are likely reactive. Small layering right-sided pleural effusion. Multinodular goiter, similar to the previous study. In the setting of significant comorbidities or limited life expectancy, no follow-up recommended (ref: J Am Coll Radiol. 2015 Feb;12(2): 143-50). IMPRESSION: 1. Large peripherally-enhancing periprosthetic fluid collection surrounding the proximal right humerus measuring up to 14 cm in maximal dimension. Findings are most concerning for infected fluid collection/abscess given the patient's history. Aspiration and joint fluid analysis is recommended. 2. Chronic superomedial migration of the humeral head component with extensive osseous remodeling of the scapula and distal clavicle. Chronic osteolysis involving the proximal humeral diaphysis, not significantly progressed. 3. Small layering right-sided pleural effusion. Electronically Signed   By: Davina Poke D.O.   On: 11/03/2022 15:09   VAS [place] LOWER EXTREMITY  VENOUS (DVT) (7a-7p)  Result Date: 11/03/2022  Lower Venous DVT Study Patient Name:  LATRISE BURROUS  Date of Exam:   11/03/2022 Medical Rec #: [id number]        Accession #:    [id number] Date of Birth: [birth year]        Patient Gender: F Patient Age:   70 years Exam Location:  [facility] Procedure:      VAS [place] LOWER EXTREMITY VENOUS (DVT) Referring Phys: ADAM CURATOLO --------------------------------------------------------------------------------  Indications: Pain, Edema, and Chronic swelling.  Risk Factors: Surgery Lower ext bypass graft, left leg stenting. Comparison Study: 03/16/21 - Negative LE venous Performing Technologist: Velva Harman Sturdivant RDMS, RVT  Examination Guidelines: A complete evaluation includes B-mode imaging, spectral Doppler, color Doppler, and power Doppler as needed of all accessible portions of each vessel. Bilateral testing is considered an integral part of a complete examination. Limited examinations for reoccurring indications may be performed as noted. The reflux portion of the exam is performed with the patient in reverse Trendelenburg.  +---------+---------------+---------+-----------+----------+-------------------+ RIGHT    CompressibilityPhasicitySpontaneityPropertiesThrombus Aging      +---------+---------------+---------+-----------+----------+-------------------+ CFV      Full           Yes      Yes                                      +---------+---------------+---------+-----------+----------+-------------------+ SFJ      Full                                                             +---------+---------------+---------+-----------+----------+-------------------+  FV Prox  Full                                                             +---------+---------------+---------+-----------+----------+-------------------+ FV Mid   Full                                                              +---------+---------------+---------+-----------+----------+-------------------+ FV DistalFull                                                             +---------+---------------+---------+-----------+----------+-------------------+ PFV      Full                                                             +---------+---------------+---------+-----------+----------+-------------------+ POP      Full           Yes      Yes                                      +---------+---------------+---------+-----------+----------+-------------------+ PTV      Full                                                             +---------+---------------+---------+-----------+----------+-------------------+ PERO                                                  Not well visualized +---------+---------------+---------+-----------+----------+-------------------+   Right Technical Findings: Bilateral calf veins were not well visualized. Cannot exclude chronic DVT in the calf veins.  +---------+---------------+---------+-----------+----------+-------------------+ LEFT     CompressibilityPhasicitySpontaneityPropertiesThrombus Aging      +---------+---------------+---------+-----------+----------+-------------------+ CFV      Full           Yes      Yes                                      +---------+---------------+---------+-----------+----------+-------------------+ SFJ      Full                                                             +---------+---------------+---------+-----------+----------+-------------------+  FV Prox  Full                                                             +---------+---------------+---------+-----------+----------+-------------------+ FV Mid   Full                                                             +---------+---------------+---------+-----------+----------+-------------------+ FV DistalFull                                                              +---------+---------------+---------+-----------+----------+-------------------+ PFV      Full                                                             +---------+---------------+---------+-----------+----------+-------------------+ POP      Full           Yes      Yes                                      +---------+---------------+---------+-----------+----------+-------------------+ PTV      Full                                                             +---------+---------------+---------+-----------+----------+-------------------+ PERO                                                  Not well visualized +---------+---------------+---------+-----------+----------+-------------------+     Summary: BILATERAL: - No evidence of acute deep vein thrombosis seen in the lower extremities, bilaterally. -No evidence of popliteal cyst, bilaterally. RIGHT: -   *See table(s) above for measurements and observations.    Preliminary    DG Chest 2 View  Result Date: 11/03/2022 CLINICAL DATA:  Chest pain. EXAM: CHEST - 2 VIEW COMPARISON:  October 25, 2022. FINDINGS: Stable cardiomegaly. Large hiatal hernia is noted. Minimal right basilar subsegmental atelectasis is noted. Status post bilateral shoulder arthroplasties. IMPRESSION: Minimal right basilar subsegmental atelectasis. Large hiatal hernia. Electronically Signed   By: Marijo Conception M.D.   On: 11/03/2022 12:06   DG Shoulder Right  Result Date: 11/03/2022 CLINICAL DATA:  Pain EXAM: RIGHT SHOULDER - 3 VIEW COMPARISON:  None Available. FINDINGS: There is a right shoulder prosthesis. There appears to be grossly anatomic alignment. There is absence of the  acromial humeral distance indicating the presence of rotator cuff defect. No acute fracture, dislocation or subluxation identified. IMPRESSION: Status post right shoulder arthroplasty. No acute osseous abnormalities are seen. Electronically Signed   By:  Sammie Bench M.D.   On: 11/03/2022 12:03    Pending Labs Unresulted Labs (From admission, onward)     Start     Ordered   11/03/22 1114  Lactic acid, plasma  Now then every 2 hours,   R (with STAT occurrences)      11/03/22 1114   11/03/22 1114  Blood culture (routine x 2)  BLOOD CULTURE X 2,   R (with STAT occurrences)      11/03/22 1114   11/03/22 1114  Urinalysis, Routine w reflex microscopic -Urine, Clean Catch  Once,   URGENT       Question:  Specimen Source  Answer:  Urine, Clean Catch   11/03/22 1114            Vitals/Pain Today's Vitals   11/03/22 1105 11/03/22 1130 11/03/22 1345 11/03/22 1521  BP: (!) 129/57  (!) 119/50 (!) 119/50  Pulse: 83  70 79  Resp: 16  19 (!) 21  Temp: 99 F (37.2 C) 99.1 F (37.3 C)    TempSrc: Oral Rectal    SpO2: 93%  92% 98%    Isolation Precautions No active isolations  Medications Medications  ceFEPIme (MAXIPIME) 2 g in sodium chloride 0.9 % 100 mL IVPB (0 g Intravenous Stopped 11/03/22 1521)  vancomycin (VANCOREADY) IVPB 1750 mg/350 mL (1,750 mg Intravenous New Bag/Given 11/03/22 1524)  aspirin EC tablet 162 mg (has no administration in time range)  amLODipine (NORVASC) tablet 10 mg (has no administration in time range)  losartan (COZAAR) tablet 50 mg (has no administration in time range)  acidophilus (RISAQUAD) capsule 1 capsule (has no administration in time range)  pantoprazole (PROTONIX) EC tablet 40 mg (has no administration in time range)  ondansetron (ZOFRAN) tablet 4 mg (has no administration in time range)  polyethylene glycol (MIRALAX / GLYCOLAX) packet 17 g (has no administration in time range)  albuterol (VENTOLIN HFA) 108 (90 Base) MCG/ACT inhaler 2 puff (has no administration in time range)  budesonide (PULMICORT) nebulizer solution 0.5 mg (has no administration in time range)  dextromethorphan-guaiFENesin (MUCINEX DM) 30-600 MG per 12 hr tablet 1 tablet (has no administration in time range)   ipratropium-albuterol (DUONEB) 0.5-2.5 (3) MG/3ML nebulizer solution 3 mL (has no administration in time range)  loratadine (CLARITIN) tablet 10 mg (has no administration in time range)  diclofenac Sodium (VOLTAREN) 1 % topical gel 2 g (has no administration in time range)  hypromellose (GENTEAL) 0.3 % ophthalmic ointment 1 Application (has no administration in time range)  Polyethyl Glycol-Propyl Glycol 0.4-0.3 % SOLN 2 drop (has no administration in time range)  vancomycin (VANCOREADY) IVPB 1750 mg/350 mL (has no administration in time range)  insulin aspart (novoLOG) injection 0-20 Units (has no administration in time range)  heparin injection 5,000 Units (has no administration in time range)  0.9 %  sodium chloride infusion (has no administration in time range)  acetaminophen (TYLENOL) tablet 650 mg (has no administration in time range)    Or  acetaminophen (TYLENOL) suppository 650 mg (has no administration in time range)  senna (SENOKOT) tablet 8.6 mg (has no administration in time range)  furosemide (LASIX) tablet 40 mg (has no administration in time range)  fentaNYL (SUBLIMAZE) injection 50 mcg (50 mcg Intravenous Given 11/03/22 1335)  sodium chloride 0.9 %  bolus 500 mL (0 mLs Intravenous Stopped 11/03/22 1521)  iohexol (OMNIPAQUE) 350 MG/ML injection 75 mL (75 mLs Intravenous Contrast Given 11/03/22 1411)    Mobility non-ambulatory     Focused Assessments     R Recommendations: See Admitting Provider Note  Report given to:   Additional Notes:

## 2022-11-04 ENCOUNTER — Inpatient Hospital Stay (HOSPITAL_COMMUNITY): Payer: Medicare PPO

## 2022-11-04 DIAGNOSIS — M25511 Pain in right shoulder: Secondary | ICD-10-CM | POA: Diagnosis not present

## 2022-11-04 DIAGNOSIS — E871 Hypo-osmolality and hyponatremia: Secondary | ICD-10-CM | POA: Diagnosis not present

## 2022-11-04 HISTORY — PX: IR US GUIDE BX ASP/DRAIN: IMG2392

## 2022-11-04 LAB — BASIC METABOLIC PANEL
Anion gap: 8 (ref 5–15)
BUN: 15 mg/dL (ref 8–23)
CO2: 31 mmol/L (ref 22–32)
Calcium: 8.7 mg/dL — ABNORMAL LOW (ref 8.9–10.3)
Chloride: 86 mmol/L — ABNORMAL LOW (ref 98–111)
Creatinine, Ser: 0.79 mg/dL (ref 0.44–1.00)
GFR, Estimated: >60 mL/min (ref >60)
Glucose, Bld: 151 mg/dL — ABNORMAL HIGH (ref 70–99)
Potassium: 5.1 mmol/L (ref 3.5–5.1)
Sodium: 125 mmol/L — ABNORMAL LOW (ref 135–145)

## 2022-11-04 LAB — GLUCOSE, CAPILLARY
Glucose-Capillary: 150 mg/dL — ABNORMAL HIGH (ref 70–99)
Glucose-Capillary: 119 mg/dL — ABNORMAL HIGH (ref 70–99)
Glucose-Capillary: 209 mg/dL — ABNORMAL HIGH (ref 70–99)
Glucose-Capillary: 219 mg/dL — ABNORMAL HIGH (ref 70–99)
Glucose-Capillary: 192 mg/dL — ABNORMAL HIGH (ref 70–99)

## 2022-11-04 MED ORDER — LIDOCAINE-EPINEPHRINE 1 %-1:100000 IJ SOLN
INTRAMUSCULAR | Status: AC
  Administered 2022-11-04: 10 mL
  Filled 2022-11-04: qty 1

## 2022-11-04 MED ORDER — SODIUM CHLORIDE 0.9% FLUSH
5.0000 mL | Freq: Three times a day (TID) | INTRAVENOUS | Status: DC
  Administered 2022-11-04 – 2022-11-16 (×36): 5 mL

## 2022-11-04 NOTE — Progress Notes (Signed)
Patient is back on the floor via bed after IR procedure at 1250 PM

## 2022-11-04 NOTE — Procedures (Signed)
Interventional Radiology Procedure:   Indications: Right shoulder abscesses  Procedure: US guided placement of 2 drains in right shoulder abscesses  Findings: Large right lateral fluid collection.  Smaller right anterior fluid collections. 10 Fr drain placed in right lateral shoulder abscess, labeled as drain #1.  Removed 130 ml of pink purulent fluid from the drain.  8 Fr drain placed in smaller right anterior abscess, drain #2, and removed 40 ml of yellow purulent fluid. Samples sent for cultures.  Right anterior shoulder collections appear to be communicating with each other and most of right shoulder fluid collections were decompressed by end of procedure.    Complications: None     EBL: Minimal  Plan: Send fluid for culture.  Follow drain output and plan to remove drains prior to discharge.   Heidi Rayos R. Anselm Pancoast, MD  Pager: [phone]

## 2022-11-04 NOTE — Progress Notes (Signed)
Orthopedics Progress Note  Subjective: Patient with no pain in the shoulder currently while resting in bed. It has been painful the last week or so.   Objective:  Vitals:   11/04/22 0515 11/04/22 0712  BP: (!) 148/68 (!) 137/52  Pulse: 95 73  Resp: 17 18  Temp: 98 F (36.7 C) 98 F (36.7 C)  SpO2: 96% (!) 87%    General: Awake and alert  Musculoskeletal: Right shoulder with well healed incision. No anterior swelling or erythema or tenderness. Fairly large firm mass posterior to the shoulder joint deep to the deltoid. Compartments are supple. No swelling or tenderness at the elbow or the wrist.  Neurovascularly intact  Lab Results  Component Value Date   WBC 15.5 (H) 11/03/2022   HGB 11.2 (L) 11/03/2022   HCT 34.3 (L) 11/03/2022   MCV 84.7 11/03/2022   PLT 370 11/03/2022       Component Value Date/Time   NA 125 (L) 11/04/2022 0135   K 5.1 11/04/2022 0135   CL 86 (L) 11/04/2022 0135   CO2 31 11/04/2022 0135   GLUCOSE 151 (H) 11/04/2022 0135   BUN 15 11/04/2022 0135   CREATININE 0.79 11/04/2022 0135   CREATININE 0.81 02/09/2021 1641   CALCIUM 8.7 (L) 11/04/2022 0135   GFRNONAA >60 11/04/2022 0135   GFRNONAA 39 (L) 01/12/2021 0000   GFRAA 45 (L) 01/12/2021 0000    Lab Results  Component Value Date   INR CANCELED 03/25/2021   INR 0.98 08/07/2012   INR 1.04 02/26/2012    Assessment/Plan: Right shoulder chronic infection of hemi-arthroplasty.  Patient is a poor candidate for open surgery.  I discussed the situation with the patient and her daughter who agree with initial attempt to decompress with IR and suppression.  If radiology unable to get decent specimen or adequate evacuation and drain placement then open I+D and bead placement would be next option with obvious high peri-op risk.  Doran Heater. Veverly Fells, MD 11/04/2022 10:29 AM

## 2022-11-04 NOTE — Progress Notes (Signed)
Orthopedics Interim Progress Note  I have reviewed the assessment of Silvestre Gunner PA-C and the CT scan results suggesting septic shoulder. This patient has history of shoulder sepsis in the past with a low virulence organism. Family did not want resection or revision surgery. Antibiotic suppression has been the current plan.   Plan: If possible would recommend IR guided drainage and culture of the fluid collection with continued empiric abx. Surgical decompression/debridement would be last option and only if cleared by medicine.   Doran Heater. Veverly Fells, MD 11/04/2022 8:14 AM

## 2022-11-04 NOTE — Consult Note (Signed)
Chief Complaint: Patient was seen in consultation today for right shoulder abscess aspiration/drain placement Chief Complaint  Patient presents with   Shoulder Pain   at the request of Dr Gilberto Better   Supervising Physician: Markus Daft  Patient Status: [facility] - In-pt  History of Present Illness: Heidi Dominguez is a 89 y.o. female   Hx B shoulder replacement Recurrent Rt shoulder sepsis Antibiotics on many occasions--- continues to recur Ortho has seen pt and feels needs IR asp/drain placement Pt 89 yo and not great candidate for surgery---- pts family also wanting to avoid surgery when can  CT yesterday:  IMPRESSION: 1. Large peripherally-enhancing periprosthetic fluid collection surrounding the proximal right humerus measuring up to 14 cm in maximal dimension. Findings are most concerning for infected fluid collection/abscess given the patient's history. Aspiration and joint fluid analysis is recommended. 2. Chronic superomedial migration of the humeral head component with extensive osseous remodeling of the scapula and distal clavicle. Chronic osteolysis involving the proximal humeral diaphysis, not significantly progressed. 3. Small layering right-sided pleural effusion.   Dr Anselm Pancoast has reviewed imaging and examined pt He approves procedure Plan for today  Past Medical History:  Diagnosis Date   Acute pancreatitis    hx of    Arthritis    "qwhere" (07/04/2018)   Chronic back pain    "all over" (07/04/2018)   Chronic diastolic CHF (congestive heart failure) (HCC)    Chronic kidney disease    stage  3 chronic kidney disease    Complication of anesthesia    "I have a hard time waking up"   Degenerative joint disease of shoulder region    Dry eye syndrome    Dyspnea    Family history of adverse reaction to anesthesia    "daughter has the shakes when she wakes up"   GERD (gastroesophageal reflux disease)    Heart murmur    High cholesterol    History of hiatal  hernia    HOH (hard of hearing)    Hypertension    Impacted cerumen of both ears    hx of    Muscle weakness (generalized)    Osteoarthritis    Overactive bladder    Pancreatitis    hx of    Phlebitis    "BLE"   Rotator cuff tear    right    Tear of right supraspinatus tendon    Thrombocytopenia (HCC)    hx of    Type II diabetes mellitus (HCC)    Urinary tract infection    hx of    UTI (urinary tract infection) 09/12/2018   Xerosis cutis    hx of     Past Surgical History:  Procedure Laterality Date   ABDOMINAL AORTOGRAM W/LOWER EXTREMITY N/A 12/22/2020   Procedure: ABDOMINAL AORTOGRAM W/LOWER EXTREMITY;  Surgeon: Cherre Robins, MD;  Location: [facility];  Service: Cardiovascular;  Laterality: N/A;   ABDOMINAL HYSTERECTOMY     BACK SURGERY     CATARACT EXTRACTION W/ INTRAOCULAR LENS  IMPLANT, BILATERAL Bilateral    CHOLECYSTECTOMY  2016   ERCP N/A 08/30/2018   Procedure: ENDOSCOPIC RETROGRADE CHOLANGIOPANCREATOGRAPHY (ERCP);  Surgeon: Clarene Essex, MD;  Location: [facility];  Service: Endoscopy;  Laterality: N/A;   FIXATION KYPHOPLASTY     INCISION AND DRAINAGE Right 11/08/2018   Procedure: INCISION AND DRAINAGE right shoulder, placement of antibiotic beads ;  Surgeon: [name], MD;  Location: [facility];  Service: Orthopedics;  Laterality: Right;   JOINT  REPLACEMENT     LAPAROSCOPIC CHOLECYSTECTOMY SINGLE SITE WITH INTRAOPERATIVE CHOLANGIOGRAM N/A 04/11/2015   Procedure: LAPAROSCOPIC LYSIS OF ADHESIONS, LAPAROSCOPIC CHOLECYSTECTOMY WITH INTRAOPERATIVE CHOLANGIOGRAM;  Surgeon: Michael Boston, MD;  Location: [facility];  Service: General;  Laterality: N/A;   PANCREATIC STENT PLACEMENT  08/30/2018   Procedure: PANCREATIC STENT PLACEMENT;  Surgeon: Clarene Essex, MD;  Location: [facility];  Service: Endoscopy;;   PERIPHERAL VASCULAR INTERVENTION  12/22/2020   Procedure: PERIPHERAL VASCULAR INTERVENTION;  Surgeon: Cherre Robins, MD;  Location: [facility];   Service: Cardiovascular;;  left anterior tibial artery   REMOVAL OF STONES  08/30/2018   Procedure: REMOVAL OF STONES;  Surgeon: Clarene Essex, MD;  Location: [facility];  Service: Endoscopy;;   SHOULDER OPEN ROTATOR CUFF REPAIR Bilateral    SPHINCTEROTOMY  08/30/2018   Procedure: SPHINCTEROTOMY;  Surgeon: Clarene Essex, MD;  Location: [facility];  Service: Endoscopy;;   TOTAL KNEE ARTHROPLASTY Bilateral     Allergies: Augmentin [amoxicillin-pot clavulanate], Bactrim [sulfamethoxazole-trimethoprim], Calan [verapamil], Catapres [clonidine hcl], Codeine, Hydrocodone, Other, Sulfa antibiotics, Zestril [lisinopril], and Azulfidine [sulfasalazine]  Medications: Prior to Admission medications   Medication Sig Start Date End Date Taking? Authorizing Provider  acetaminophen (TYLENOL) 325 MG tablet Take 650 mg by mouth 3 (three) times daily.    [provider]  albuterol (VENTOLIN HFA) 108 (90 Base) MCG/ACT inhaler Inhale 2 puffs into the lungs every 6 (six) hours as needed for wheezing or shortness of breath.    [provider]  amLODipine (NORVASC) 10 MG tablet Take 10 mg by mouth See admin instructions. 10 mg once daily in the morning. HOLD if SBP < 110.    [provider]  aspirin EC 81 MG tablet Take 162 mg by mouth at bedtime.    [provider]  budesonide (PULMICORT) 0.5 MG/2ML nebulizer solution Take 2 mLs (0.5 mg total) by nebulization in the morning and at bedtime. 10/16/22   Cobb, Karie Schwalbe, NP  Cholecalciferol (VITAMIN D3) 25 MCG (1000 UT) capsule Take 1,000 Units by mouth every Monday.    [provider]  dextromethorphan-guaiFENesin (MUCINEX DM) 30-600 MG 12hr tablet Take 1 tablet by mouth 2 (two) times daily. 10/16/22   Cobb, Karie Schwalbe, NP  diclofenac Sodium (VOLTAREN ARTHRITIS PAIN) 1 % GEL Apply 2 g topically 3 (three) times daily. To both shoulders.    [provider]  furosemide (LASIX) 20 MG tablet Take 0.5 tablets (10 mg total)  by mouth daily for 10 days. 10/26/22 11/05/22  Tacy Learn, PA-C  guaiFENesin (ROBITUSSIN) 100 MG/5ML liquid Take 10 mLs by mouth every 6 (six) hours as needed for cough or to loosen phlegm. 10/01/22   Barb Merino, MD  hydrochlorothiazide (HYDRODIURIL) 25 MG tablet Take 25 mg by mouth daily. 10/09/22   [provider]  hypromellose (GENTEAL SEVERE) 0.3 % GEL ophthalmic ointment Place 1 Application into both eyes 2 (two) times daily.    [provider]  ipratropium-albuterol (DUONEB) 0.5-2.5 (3) MG/3ML SOLN Take 3 mLs by nebulization 3 (three) times daily. 09/19/22   [provider]  Lactobacillus (PROBIOTIC ACIDOPHILUS) CAPS Take 1 capsule by mouth at bedtime.    [provider]  loratadine (CLARITIN) 10 MG tablet Take 10 mg by mouth at bedtime.    [provider]  losartan (COZAAR) 50 MG tablet Take 50 mg by mouth See admin instructions. 50 mg once daily in the morning. HOLD for SBP < 110.    [provider]  metFORMIN (GLUCOPHAGE)  500 MG tablet Take 0.5 tablets (250 mg total) by mouth 2 (two) times daily with a meal. 10/26/22 11/25/22  Tacy Learn, PA-C  Multiple Vitamins-Minerals (PRESERVISION AREDS) CAPS Take 1 capsule by mouth daily.    [provider]  OMEPRAZOLE PO Take 20 mg by mouth daily. Omeprazole 20 mg DR disintegrating tablet.    [provider]  ondansetron (ZOFRAN) 4 MG tablet Take 4 mg by mouth every 8 (eight) hours as needed for nausea or vomiting.    [provider]  Polyethyl Glycol-Propyl Glycol (LUBRICANT EYE DROPS) 0.4-0.3 % SOLN Place 2 drops into both eyes daily.    [provider]  polyethylene glycol powder (GLYCOLAX/MIRALAX) 17 GM/SCOOP powder Take 17 g by mouth at bedtime.    [provider]     Family History  Problem Relation Age of Onset   Hypertension Mother    Diabetes Mellitus I Mother    Hypertension Father     Social History   Socioeconomic History    Marital status: Widowed    Spouse name: Not on file   Number of children: Not on file   Years of education: Not on file   Highest education level: Not on file  Occupational History   Occupation: retired  Tobacco Use   Smoking status: Never   Smokeless tobacco: Never  Vaping Use   Vaping Use: Never used  Substance and Sexual Activity   Alcohol use: No    Comment: rare   Drug use: No   Sexual activity: Not on file  Other Topics Concern   Not on file  Social History Narrative   Not on file   Social Determinants of Health   Financial Resource Strain: Not on file  Food Insecurity: No Food Insecurity (11/03/2022)   Hunger Vital Sign    Worried About Running Out of Food in the Last Year: Never true    Ran Out of Food in the Last Year: Never true  Transportation Needs: No Transportation Needs (11/03/2022)   PRAPARE - Hydrologist (Medical): No    Lack of Transportation (Non-Medical): No  Physical Activity: Not on file  Stress: Not on file  Social Connections: Not on file    Review of Systems: A 12 point ROS discussed and pertinent positives are indicated in the HPI above.  All other systems are negative.  Vital Signs: BP (!) 137/52 (BP Location: Left Arm)   Pulse 73   Temp 98 F (36.7 C) (Oral)   Resp 18   Ht 5\' 4"  (1.626 m)   Wt 190 lb 11.2 oz (86.5 kg)   SpO2 (!) 87%   BMI 32.73 kg/m    Physical Exam Vitals reviewed.  HENT:     Mouth/Throat:     Mouth: Mucous membranes are moist.  Cardiovascular:     Rate and Rhythm: Normal rate.     Heart sounds: Murmur heard.  Pulmonary:     Effort: Pulmonary effort is normal.     Breath sounds: No wheezing.  Abdominal:     Palpations: Abdomen is soft.  Musculoskeletal:        General: Normal range of motion.     Comments: Limited ROM Bilat shoulders No pain  Skin:    General: Skin is warm.     Comments: Rt shoulder area is warm to touch No redness No swelling  Neurological:     Mental  Status: She is alert. Mental status is at baseline.  Psychiatric:     Comments: HOH Spoke to [facility] via phone She agrees to procedure     Imaging: CT SHOULDER RIGHT W CONTRAST  Result Date: 11/03/2022 CLINICAL DATA:  Septic arthritis suspected, shoulder, xray done. Leukocytosis and elevated serum inflammatory markers. EXAM: CT OF THE UPPER RIGHT EXTREMITY WITH CONTRAST TECHNIQUE: Multidetector CT imaging of the upper right extremity was performed according to the standard protocol following intravenous contrast administration. RADIATION DOSE REDUCTION: This exam was performed according to the departmental dose-optimization program which includes automated exposure control, adjustment of the mA and/or kV according to patient size and/or use of iterative reconstruction technique. CONTRAST:  28mL OMNIPAQUE IOHEXOL 350 MG/ML SOLN COMPARISON:  X-ray 11/03/2022, CT 05/27/2021 FINDINGS: Bones/Joint/Cartilage Long stem right humeral prosthesis. The inferior tip of the prosthesis is not included within the field of view. Chronic superomedial migration of the humeral head component with extensive osseous remodeling of the scapula and distal clavicle. Chronic osteolysis involving the proximal humeral diaphysis, not significantly progressed. No acute fracture. Os acromiale. Ligaments Suboptimally assessed by CT. Muscles and Tendons Findings of chronic rotator cuff tears with chronic muscle atrophy. Soft tissues Large peripherally enhancing periprosthetic fluid collection surrounding the proximal right humerus measuring approximately 14 x 5 cm in transaxial dimension. Loculated component at the anterior shoulder extends approximately 10 cm in craniocaudal dimension. Collection closely approximates the skin surface at the anterior aspect of the shoulder. Loculated component at the posterior aspect of the shoulder measures approximately 8 cm in craniocaudal dimension. Mildly prominent right axillary lymph nodes are  likely reactive. Small layering right-sided pleural effusion. Multinodular goiter, similar to the previous study. In the setting of significant comorbidities or limited life expectancy, no follow-up recommended (ref: J Am Coll Radiol. 2015 Feb;12(2): 143-50). IMPRESSION: 1. Large peripherally-enhancing periprosthetic fluid collection surrounding the proximal right humerus measuring up to 14 cm in maximal dimension. Findings are most concerning for infected fluid collection/abscess given the patient's history. Aspiration and joint fluid analysis is recommended. 2. Chronic superomedial migration of the humeral head component with extensive osseous remodeling of the scapula and distal clavicle. Chronic osteolysis involving the proximal humeral diaphysis, not significantly progressed. 3. Small layering right-sided pleural effusion. Electronically Signed   By: Davina Poke D.O.   On: 11/03/2022 15:09   VAS [place] LOWER EXTREMITY VENOUS (DVT) (7a-7p)  Result Date: 11/03/2022  Lower Venous DVT Study Patient Name:  AUBRII BIRKY  Date of Exam:   11/03/2022 Medical Rec #: [id number]        Accession #:    [id number] Date of Birth: 01/15/1921        Patient Gender: F Patient Age:   12 years Exam Location:  [facility] Procedure:      VAS [place] LOWER EXTREMITY VENOUS (DVT) Referring Phys: ADAM CURATOLO --------------------------------------------------------------------------------  Indications: Pain, Edema, and Chronic swelling.  Risk Factors: Surgery Lower ext bypass graft, left leg stenting. Comparison Study: 03/16/21 - Negative LE venous Performing Technologist: Velva Harman Sturdivant RDMS, RVT  Examination Guidelines: A complete evaluation includes B-mode imaging, spectral Doppler, color Doppler, and power Doppler as needed of all accessible portions of each vessel. Bilateral testing is considered an integral part of a complete examination. Limited examinations for reoccurring indications may be performed as noted. The  reflux portion of the exam is performed with the patient in reverse Trendelenburg.  +---------+---------------+---------+-----------+----------+-------------------+ RIGHT    CompressibilityPhasicitySpontaneityPropertiesThrombus Aging      +---------+---------------+---------+-----------+----------+-------------------+ CFV      Full  Yes      Yes                                      +---------+---------------+---------+-----------+----------+-------------------+ SFJ      Full                                                             +---------+---------------+---------+-----------+----------+-------------------+ FV Prox  Full                                                             +---------+---------------+---------+-----------+----------+-------------------+ FV Mid   Full                                                             +---------+---------------+---------+-----------+----------+-------------------+ FV DistalFull                                                             +---------+---------------+---------+-----------+----------+-------------------+ PFV      Full                                                             +---------+---------------+---------+-----------+----------+-------------------+ POP      Full           Yes      Yes                                      +---------+---------------+---------+-----------+----------+-------------------+ PTV      Full                                                             +---------+---------------+---------+-----------+----------+-------------------+ PERO                                                  Not well visualized +---------+---------------+---------+-----------+----------+-------------------+   Right Technical Findings: Bilateral calf veins were not well visualized. Cannot exclude chronic DVT in the calf veins.   +---------+---------------+---------+-----------+----------+-------------------+ LEFT     CompressibilityPhasicitySpontaneityPropertiesThrombus Aging      +---------+---------------+---------+-----------+----------+-------------------+ CFV  Full           Yes      Yes                                      +---------+---------------+---------+-----------+----------+-------------------+ SFJ      Full                                                             +---------+---------------+---------+-----------+----------+-------------------+ FV Prox  Full                                                             +---------+---------------+---------+-----------+----------+-------------------+ FV Mid   Full                                                             +---------+---------------+---------+-----------+----------+-------------------+ FV DistalFull                                                             +---------+---------------+---------+-----------+----------+-------------------+ PFV      Full                                                             +---------+---------------+---------+-----------+----------+-------------------+ POP      Full           Yes      Yes                                      +---------+---------------+---------+-----------+----------+-------------------+ PTV      Full                                                             +---------+---------------+---------+-----------+----------+-------------------+ PERO                                                  Not well visualized +---------+---------------+---------+-----------+----------+-------------------+     Summary: BILATERAL: - No evidence of acute deep vein thrombosis seen in the lower extremities, bilaterally. -No evidence of popliteal cyst, bilaterally. RIGHT: -   *  See table(s) above for measurements and observations.    Preliminary    DG  Chest 2 View  Result Date: 11/03/2022 CLINICAL DATA:  Chest pain. EXAM: CHEST - 2 VIEW COMPARISON:  October 25, 2022. FINDINGS: Stable cardiomegaly. Large hiatal hernia is noted. Minimal right basilar subsegmental atelectasis is noted. Status post bilateral shoulder arthroplasties. IMPRESSION: Minimal right basilar subsegmental atelectasis. Large hiatal hernia. Electronically Signed   By: Marijo Conception M.D.   On: 11/03/2022 12:06   DG Shoulder Right  Result Date: 11/03/2022 CLINICAL DATA:  Pain EXAM: RIGHT SHOULDER - 3 VIEW COMPARISON:  None Available. FINDINGS: There is a right shoulder prosthesis. There appears to be grossly anatomic alignment. There is absence of the acromial humeral distance indicating the presence of rotator cuff defect. No acute fracture, dislocation or subluxation identified. IMPRESSION: Status post right shoulder arthroplasty. No acute osseous abnormalities are seen. Electronically Signed   By: Sammie Bench M.D.   On: 11/03/2022 12:03   DG Chest 2 View  Result Date: 10/25/2022 CLINICAL DATA:  Shortness of breath. EXAM: CHEST - 2 VIEW COMPARISON:  09/28/2022. FINDINGS: The heart is enlarged and the mediastinal contour is within normal limits. A large hiatal hernia is noted. There is atherosclerotic calcification of the aorta. Lung volumes are low with atelectasis at the lung bases. No effusion or pneumothorax. Shoulder arthroplasty changes are present bilaterally. Degenerative changes are noted on the thoracic spine. Surgical clips are present in the right upper quadrant. IMPRESSION: 1. Low lung volumes with mild atelectasis at the lung bases. 2. Cardiomegaly. 3. Large hiatal hernia. Electronically Signed   By: Brett Fairy M.D.   On: 10/25/2022 22:54    Labs:  CBC: Recent Labs    09/28/22 1106 09/29/22 0028 10/25/22 2234 10/25/22 2242 10/25/22 2329 11/03/22 1136  WBC 7.5 4.4 13.1*  --   --  15.5*  HGB 12.4 11.6* 11.4* 12.6 12.6 11.2*  HCT 38.8 37.6 35.7* 37.0  37.0 34.3*  PLT 225 221 228  --   --  370    COAGS: No results for input(s): "INR", "APTT" in the last 8760 hours.  BMP: Recent Labs    09/29/22 0028 10/25/22 2234 10/25/22 2242 10/25/22 2329 11/03/22 1136 11/04/22 0135  NA 133* 122* 116* 124* 122* 125*  K 4.3 3.9 4.2 3.4* 5.1 5.1  CL 91* 82*  --   --  84* 86*  CO2 32 22  --   --  27 31  GLUCOSE 200* 560*  --   --  307* 151*  BUN 5* 28*  --   --  17 15  CALCIUM 9.1 9.0  --   --  8.4* 8.7*  CREATININE 0.56 0.81  --   --  0.75 0.79  GFRNONAA >60 >60  --   --  >60 >60    LIVER FUNCTION TESTS: Recent Labs    09/29/22 0028 10/25/22 2234 11/03/22 1136  BILITOT 0.3 QUANTITY NOT SUFFICIENT, UNABLE TO PERFORM TEST 0.5  AST 14* 14* 23  ALT 10 12 11   ALKPHOS 78 158* 87  PROT 7.0 6.3* 6.1*  ALBUMIN 2.5* 2.1* 1.9*    TUMOR MARKERS: No results for input(s): "AFPTM", "CEA", "CA199", "CHROMGRNA" in the last 8760 hours.  Assessment and Plan:  Rt shoulder recurrent infection Large abscess per CT Scheduled for abscess aspiration/drain placement in IR today Risks and benefits discussed with the patient including bleeding, infection, damage to adjacent structures,  and sepsis. All of the patient's questions were answered,  patient is agreeable to proceed. Consent signed and in chart.  Thank you for this interesting consult.  I greatly enjoyed meeting Heidi Dominguez and look forward to participating in their care.  A copy of this report was sent to the requesting provider on this date.  Electronically Signed: Lavonia Drafts, PA-C 11/04/2022, 10:00 AM   I spent a total of 20 Minutes    in face to face in clinical consultation, greater than 50% of which was counseling/coordinating care for Right shoulder abscess aspiration/drain placement

## 2022-11-04 NOTE — Hospital Course (Addendum)
Heidi Dominguez is a 89 y.o. F with dCHF, DM, HTN, CKD IIIa, hx PVD s/p tibial stent, and hx great toe osteo, hx bilateral shoulder replacement, previous right septic shoulder requiring antibiotic beads and recent RSV infection who presented with right shoulder pain.      3/22: In ER, CT showed large shoulder fluid collection; Admitted on abx, Ortho consulted 3/23: IR consulted, underwent US guided drain placement in right shoulder abscesses 3/24: Culture growing E coli, ID consulted 3/26: Nephrology consulted for hyponatremia 3/31: GI consulted for persistent N/V

## 2022-11-04 NOTE — Progress Notes (Signed)
  Progress Note   Patient: Heidi Dominguez [id number] DOB: 11/01/1920 DOA: 11/03/2022     1 DOS: the patient was seen and examined on 11/04/2022   Brief hospital course: 89 y/o with h/o bilateral shoulder replacement, previous right septic shoulder requiring antibiotic beads presents due to increased swelling of the right shoulder and pain with movement. She has not had a fever, rigors. She has a residual cough from a recent bout of RSV but otherwise has been doing well. Due to her symptoms she was transported to [facility] for evaluation.   Assessment and Plan: * Septic arthritis of shoulder, right (HCC) H/o bilateral shoulder replacement and prior infection right prosthetic shoulder.  -Presented with increased swelling and pain, leukocytosis.  -CT shoulder confirms large collection of fluid -Ortho consulted, recommended IR attempt at drain -IR consulted, now s/p placement of 2 drains in R shoulder -Follow up on fluid cultures -continued on cefepime and Vanco    Hyponatremia Patient with prior problems with low sodium. Possible etiology diuretics: she was taking HCTZ and Lasix. She is asymptomatic -had stopped HCTZ and would not resume again in the future -continued lasix -sodium improving -Recheck bmet in AM   Chronic diastolic CHF (congestive heart failure) ([place]) Last Echo 04/09/2015 with EF 60-65%, Grade III DD. She has been managed with HCTZ and low dose lasix. -D/c HCTZ and would not resume again in the future -continue Lasix 40 mg daily -cont losartan, ASA   Diabetes mellitus type 2, controlled ([place]) Last A1C 10/02/22 7.5%.  -metformin was continued at presentation -Cont SSI as needed  Essential hypertension BP remains stable -Cont lasix, losartan, norvasc    Subjective: complaining of tenderness over R shoulder this AM  Physical Exam: Vitals:   11/04/22 0712 11/04/22 1252 11/04/22 1359 11/04/22 1535  BP: (!) 137/52 (!) 120/55 (!) 122/104 (!) 130/58  Pulse: 73 75 90 81   Resp: 18 18 18 18   Temp: 98 F (36.7 C) 98.2 F (36.8 C) 98 F (36.7 C) 98.8 F (37.1 C)  TempSrc: Oral Oral Oral Oral  SpO2: (!) 87%  90% 92%  Weight:      Height:       General exam: Awake, laying in bed, in nad Respiratory system: Normal respiratory effort, no wheezing Cardiovascular system: regular rate, s1, s2 Gastrointestinal system: Soft, nondistended, positive BS Central nervous system: CN2-12 grossly intact, strength intact Extremities: Perfused, no clubbing, swelling over R shoulder Skin: Normal skin turgor, no notable skin lesions seen Psychiatry: Mood normal // no visual hallucinations   Data Reviewed:  Labs reviewed: Na 125, K 5.1, Cr 0.79, WBC 15.5  Family Communication: Pt in room, family not at bedside  Disposition: Status is: Inpatient Remains inpatient appropriate because: Severity of illness  Planned Discharge Destination: Home    Author: Marylu Lund, MD 11/04/2022 5:47 PM  For on call review [email or web address].

## 2022-11-04 NOTE — Progress Notes (Signed)
Patient was taken for IR procedure at 1115 AM.

## 2022-11-05 ENCOUNTER — Inpatient Hospital Stay (HOSPITAL_COMMUNITY): Payer: Medicare PPO

## 2022-11-05 DIAGNOSIS — T8450XA Infection and inflammatory reaction due to unspecified internal joint prosthesis, initial encounter: Secondary | ICD-10-CM

## 2022-11-05 DIAGNOSIS — A419 Sepsis, unspecified organism: Secondary | ICD-10-CM

## 2022-11-05 DIAGNOSIS — M25511 Pain in right shoulder: Secondary | ICD-10-CM | POA: Diagnosis not present

## 2022-11-05 DIAGNOSIS — E871 Hypo-osmolality and hyponatremia: Secondary | ICD-10-CM | POA: Diagnosis not present

## 2022-11-05 DIAGNOSIS — M009 Pyogenic arthritis, unspecified: Secondary | ICD-10-CM | POA: Diagnosis not present

## 2022-11-05 LAB — COMPREHENSIVE METABOLIC PANEL
ALT: 11 U/L (ref 0–44)
AST: 13 U/L — ABNORMAL LOW (ref 15–41)
Albumin: 1.7 g/dL — ABNORMAL LOW (ref 3.5–5.0)
Alkaline Phosphatase: 65 U/L (ref 38–126)
Anion gap: 8 (ref 5–15)
BUN: 18 mg/dL (ref 8–23)
CO2: 29 mmol/L (ref 22–32)
Calcium: 8.3 mg/dL — ABNORMAL LOW (ref 8.9–10.3)
Chloride: 90 mmol/L — ABNORMAL LOW (ref 98–111)
Creatinine, Ser: 0.79 mg/dL (ref 0.44–1.00)
GFR, Estimated: >60 mL/min (ref >60)
Glucose, Bld: 121 mg/dL — ABNORMAL HIGH (ref 70–99)
Potassium: 4.9 mmol/L (ref 3.5–5.1)
Sodium: 127 mmol/L — ABNORMAL LOW (ref 135–145)
Total Bilirubin: 0.8 mg/dL (ref 0.3–1.2)
Total Protein: 5.8 g/dL — ABNORMAL LOW (ref 6.5–8.1)

## 2022-11-05 LAB — CBC
HCT: 31 % — ABNORMAL LOW (ref 36.0–46.0)
Hemoglobin: 10 g/dL — ABNORMAL LOW (ref 12.0–15.0)
MCH: 27.3 pg (ref 26.0–34.0)
MCHC: 32.3 g/dL (ref 30.0–36.0)
MCV: 84.7 fL (ref 80.0–100.0)
Platelets: 391 10*3/uL (ref 150–400)
RBC: 3.66 MIL/uL — ABNORMAL LOW (ref 3.87–5.11)
RDW: 15.5 % (ref 11.5–15.5)
WBC: 12.8 10*3/uL — ABNORMAL HIGH (ref 4.0–10.5)
nRBC: 0 % (ref 0.0–0.2)

## 2022-11-05 LAB — GLUCOSE, CAPILLARY
Glucose-Capillary: 122 mg/dL — ABNORMAL HIGH (ref 70–99)
Glucose-Capillary: 170 mg/dL — ABNORMAL HIGH (ref 70–99)
Glucose-Capillary: 171 mg/dL — ABNORMAL HIGH (ref 70–99)
Glucose-Capillary: 179 mg/dL — ABNORMAL HIGH (ref 70–99)

## 2022-11-05 MED ORDER — MELATONIN 3 MG PO TABS
3.0000 mg | ORAL_TABLET | Freq: Every evening | ORAL | Status: DC | PRN
  Administered 2022-11-05 – 2022-11-09 (×3): 3 mg via ORAL
  Filled 2022-11-05 (×3): qty 1

## 2022-11-05 MED ORDER — ONDANSETRON HCL 4 MG/2ML IJ SOLN
4.0000 mg | Freq: Four times a day (QID) | INTRAMUSCULAR | Status: DC | PRN
  Administered 2022-11-10 – 2022-11-15 (×3): 4 mg via INTRAVENOUS
  Filled 2022-11-05 (×3): qty 2

## 2022-11-05 MED ORDER — SODIUM CHLORIDE 0.9 % IV SOLN
2.0000 g | INTRAVENOUS | Status: DC
  Administered 2022-11-05 – 2022-11-09 (×5): 2 g via INTRAVENOUS
  Filled 2022-11-05 (×5): qty 20

## 2022-11-05 NOTE — Progress Notes (Signed)
TR[facility] night cross cover note:   I was notified by RN of the patient's request for a sleep aid. I subsequently placed order for prn melatonin for insomnia.     Alisha Burgo, DO Hospitalist  

## 2022-11-05 NOTE — Consult Note (Signed)
[facility]    Date of Admission:  11/03/2022     Reason for Consult: recurrent/chronic right shoulder hemiarthroplasty infection    Referring Provider: Wyline Copas     Lines:  Peripheral iv's  Abx: 3/22-c vanc 3/22-c cefepime        Assessment: 89 yo female hx bilateral shoulder arthroplasty, hx right septic shoulder arthroplasty, recent rsv, admitted with a week right shoulder pain and sepsis found to have recurrent right shoulder arthroplasty infection  Her previous right shoulder arthroplasty was ecoli (note mentioned no actual culture on epic) and she was previously on suppressive cipro bid for 2.5 years until stopped on 05/04/2021. Her last surgery was I&D and abx bead placement on 11/08/2018 -- unable to revise due to poor bone stock and presence of long stem with cables.  Ir aspirate on 3/23 cx ngtd Ortho seen -- not a good surgical candidate, but if ir unable to drain would try to decompress surgically  Of note, she did mention she was given some kind of pill/abx for the 5 days prior to admission but I couldn't verify this   Plan: I suspect this is ecoli relapse of infection. It didn't appear she took any abx since 04/2021 except a few days 2/15-17/24 empiric sepsis tx with covid infection. It is atypical how long it has been since the repeat infection although it could have been brewing all along. Other organisms certainly possible including p-acnes or staph aureus I have called lab to cultivate the culture from 3/23 for 2 weeks to see if p-acnes would grow Continue vanc. Would change cefepime to ceftriaxone Will follow culture for another couple days and decide final abx with close f/u in id clinic in case p-acnes grow Discussed with primary team     ------------------------------------------------ Principal Problem:   Septic arthritis of shoulder, right ([place]) Active Problems:   Essential hypertension   Diabetes mellitus type 2,  controlled ([place])   Chronic diastolic CHF (congestive heart failure) (HCC)   End of life care   Hyponatremia    HPI: Heidi Dominguez is a 89 y.o. female 89 yo female hx bilateral shoulder arthroplasty, hx right septic shoulder arthroplasty, recent rsv, admitted with a week right shoulder pain and sepsis found to have recurrent right shoulder arthroplasty infection  Her previous right shoulder arthroplasty was ecoli (note mentioned no actual culture on epic) and she was previously on suppressive cipro bid for 2.5 years until stopped on 05/04/2021. Her last surgery was I&D and abx bead placement on 11/08/2018 -- unable to revise due to poor bone stock and presence of long stem with cables.  Patient presented with fever from snf and a week of right shoulder pain  She saw ID 2022 with left great toe om. Per chart, had chronic/recurrent right shoulder arthroplasty infection and was on cipro suppression for ecoli. She last saw ID 04/2021 and cipro advised and also recommended attempt to aspirate shoulder again to determine cure. There was no follow up after that  She mentions she always have some pain but this was worse the week prior to admission. She was given some abx for the 5 days prior to admission but couldn't remember them. I couldn't verify any dispense report on epic  She has no other places of concerning infection. Her previous left foot infection in 2022 had resolved  On admission there was no documented fever. However mild leukocytosis Ir aspirated the left shoulder joint on  3/23 -- cx ngtd She is on bsAbx  Ct showed 14 cm fluid collection at the right shoulder joint with chronic osteolysis and superomedial migration humeral head  Family History  Problem Relation Age of Onset   Hypertension Mother    Diabetes Mellitus I Mother    Hypertension Father     Social History   Tobacco Use   Smoking status: Never   Smokeless tobacco: Never  Vaping Use   Vaping Use: Never used   Substance Use Topics   Alcohol use: No    Comment: rare   Drug use: No    Allergies  Allergen Reactions   Augmentin [Amoxicillin-Pot Clavulanate] Diarrhea   Bactrim [Sulfamethoxazole-Trimethoprim] Diarrhea and Itching   Calan [Verapamil] Diarrhea   Catapres [Clonidine Hcl] Other (See Comments)    Unknown reaction Not documented on MAR    Codeine Other (See Comments)    "Makes me out of my head, loopy"   Hydrocodone Other (See Comments)    "makes me go out of my head, loopy"   Other Other (See Comments)    Any type of narcotics Feels "loopy"    Sulfa Antibiotics Itching   Zestril [Lisinopril] Cough   Azulfidine [Sulfasalazine] Itching    Review of Systems: ROS All Other ROS was negative, except mentioned above   Past Medical History:  Diagnosis Date   Acute pancreatitis    hx of    Arthritis    "qwhere" (07/04/2018)   Chronic back pain    "all over" (07/04/2018)   Chronic diastolic CHF (congestive heart failure) (HCC)    Chronic kidney disease    stage  3 chronic kidney disease    Complication of anesthesia    "I have a hard time waking up"   Degenerative joint disease of shoulder region    Dry eye syndrome    Dyspnea    Family history of adverse reaction to anesthesia    "daughter has the shakes when she wakes up"   GERD (gastroesophageal reflux disease)    Heart murmur    High cholesterol    History of hiatal hernia    HOH (hard of hearing)    Hypertension    Impacted cerumen of both ears    hx of    Muscle weakness (generalized)    Osteoarthritis    Overactive bladder    Pancreatitis    hx of    Phlebitis    "BLE"   Rotator cuff tear    right    Tear of right supraspinatus tendon    Thrombocytopenia (HCC)    hx of    Type II diabetes mellitus ([place])    Urinary tract infection    hx of    UTI (urinary tract infection) 09/12/2018   Xerosis cutis    hx of        Scheduled Meds:  acidophilus  1 capsule Oral QHS   amLODipine  10 mg  Oral Daily   artificial tears   Both Eyes BID   aspirin EC  162 mg Oral QHS   budesonide  0.5 mg Nebulization BH-qamhs   dextromethorphan-guaiFENesin  1 tablet Oral BID   diclofenac Sodium  2 g Topical TID   furosemide  40 mg Oral Daily   heparin  5,000 Units Subcutaneous Q8H   insulin aspart  0-20 Units Subcutaneous TID WC   loratadine  10 mg Oral QHS   losartan  50 mg Oral Daily   pantoprazole  40 mg Oral Daily  polyethylene glycol  17 g Oral QHS   polyvinyl alcohol  2 drop Both Eyes Daily   senna  1 tablet Oral BID   sodium chloride flush  5 mL Intracatheter Q8H   Continuous Infusions:  sodium chloride 50 mL/hr at 11/05/22 1008   ceFEPime (MAXIPIME) IV 2 g (11/05/22 0036)   vancomycin 1,750 mg (11/05/22 1012)   PRN Meds:.acetaminophen **OR** acetaminophen, albuterol, morphine injection, naLOXone (NARCAN)  injection, ondansetron   OBJECTIVE: Blood pressure (!) 135/43, pulse 74, temperature 98.7 F (37.1 C), temperature source Oral, resp. rate 18, height 5\' 4"  (1.626 m), weight 86.5 kg, SpO2 94 %.  Physical Exam  General/constitutional: no distress, pleasant, hard at hearing, conversant HEENT: Normocephalic, PER, Conj Clear, EOMI, Oropharynx clear Neck supple CV: rrr no mrg Lungs: clear to auscultation, normal respiratory effort Abd: Soft, Nontender Ext: trace bilateral lower edema Skin: No Rash Neuro: nonfocal - generalized weakness more so on bilateral LE MSK: right shoulder with jp drains x2 draining serosanguinous fluid, appears to have some decent ac joint active rom  Lab Results Lab Results  Component Value Date   WBC 12.8 (H) 11/05/2022   HGB 10.0 (L) 11/05/2022   HCT 31.0 (L) 11/05/2022   MCV 84.7 11/05/2022   PLT 391 11/05/2022    Lab Results  Component Value Date   CREATININE 0.79 11/05/2022   BUN 18 11/05/2022   NA 127 (L) 11/05/2022   K 4.9 11/05/2022   CL 90 (L) 11/05/2022   CO2 29 11/05/2022    Lab Results  Component Value Date   ALT 11  11/05/2022   AST 13 (L) 11/05/2022   ALKPHOS 65 11/05/2022   BILITOT 0.8 11/05/2022      Microbiology: Recent Results (from the past 240 hour(s))  Blood culture (routine x 2)     Status: None (Preliminary result)   Collection Time: 11/03/22 11:36 AM   Specimen: BLOOD  Result Value Ref Range Status   Specimen Description BLOOD LEFT ANTECUBITAL  Final   Special Requests   Final    BOTTLES DRAWN AEROBIC AND ANAEROBIC Blood Culture adequate volume   Culture   Final    NO GROWTH 2 DAYS Performed at [facility], [address] [place], [place] [address]    Report Status PENDING  Incomplete  MRSA Next Gen by PCR, Nasal     Status: None   Collection Time: 11/03/22  6:08 PM   Specimen: Nasal Mucosa; Nasal Swab  Result Value Ref Range Status   MRSA by PCR Next Gen NOT DETECTED NOT DETECTED Final    Comment: (NOTE) The GeneXpert MRSA Assay (FDA approved for NASAL specimens only), is one component of a comprehensive MRSA colonization surveillance program. It is not intended to diagnose MRSA infection nor to guide or monitor treatment for MRSA infections. Test performance is not FDA approved in patients less than 39 years old. Performed at [facility], [place] [address] [place], [place] [address]   Culture, blood (Routine X 2) w Reflex to ID Panel     Status: None (Preliminary result)   Collection Time: 11/04/22  9:47 AM   Specimen: BLOOD  Result Value Ref Range Status   Specimen Description BLOOD SITE NOT SPECIFIED  Final   Special Requests   Final    BOTTLES DRAWN AEROBIC AND ANAEROBIC Blood Culture results may not be optimal due to an excessive volume of blood received in culture bottles   Culture   Final  NO GROWTH < 24 HOURS Performed at [facility] [address] [place], [place] [address]    Report Status PENDING  Incomplete  Aerobic/Anaerobic Culture w Gram Stain (surgical/deep wound)     Status: None (Preliminary result)   Collection Time: 11/04/22  12:34 PM   Specimen: Abscess  Result Value Ref Range Status   Specimen Description ABSCESS  Final   Special Requests DRAIN 1 RIGHT LATERAL SHOULDER  Final   Gram Stain   Final    ABUNDANT WBC PRESENT, PREDOMINANTLY PMN NO ORGANISMS SEEN Performed at [facility], [address], [place], [place] [address]    Culture PENDING  Incomplete   Report Status PENDING  Incomplete  Aerobic/Anaerobic Culture w Gram Stain (surgical/deep wound)     Status: None (Preliminary result)   Collection Time: 11/04/22 12:43 PM   Specimen: Abscess  Result Value Ref Range Status   Specimen Description ABSCESS  Final   Special Requests DRAIN 2 RIGHT ANTERIOR SHOULDER  Final   Gram Stain   Final    ABUNDANT WBC PRESENT, PREDOMINANTLY PMN NO ORGANISMS SEEN Performed at [facility], [place] [address] [place], [place] [address]    Culture PENDING  Incomplete   Report Status PENDING  Incomplete     Serology:    Imaging: If present, new imagings (plain films, ct scans, and mri) have been personally visualized and interpreted; radiology reports have been reviewed. Decision making incorporated into the Impression / Recommendations.   3/22 ct right shoulder 1. Large peripherally-enhancing periprosthetic fluid collection surrounding the proximal right humerus measuring up to 14 cm in maximal dimension. Findings are most concerning for infected fluid collection/abscess given the patient's history. Aspiration and joint fluid analysis is recommended. 2. Chronic superomedial migration of the humeral head component with extensive osseous remodeling of the scapula and distal clavicle. Chronic osteolysis involving the proximal humeral diaphysis, not significantly progressed. 3. Small layering right-sided pleural effusion.  Jabier Mutton, [facility] [place] [phone] pager    11/05/2022, 11:24 AM

## 2022-11-05 NOTE — Progress Notes (Signed)
  Progress Note   Patient: Heidi Dominguez [id number] DOB: 05-24-1921 DOA: 11/03/2022     2 DOS: the patient was seen and examined on 11/05/2022   Brief hospital course: 89 y/o with h/o bilateral shoulder replacement, previous right septic shoulder requiring antibiotic beads presents due to increased swelling of the right shoulder and pain with movement. She has not had a fever, rigors. She has a residual cough from a recent bout of RSV but otherwise has been doing well. Due to her symptoms she was transported to [facility] for evaluation.   Assessment and Plan: * Septic arthritis of shoulder, right (HCC) H/o bilateral shoulder replacement and prior infection right prosthetic shoulder.  -Presented with increased swelling and pain, leukocytosis.  -CT shoulder confirms large collection of fluid -Ortho following. Pt now s/p drain placement in R shoulder -cultures pending -Appreciate input by ID.  Recommendation to continue vancomycin, changing cefepime to ceftriaxone. -Follow up on fluid cultures   Hyponatremia Patient with prior problems with low sodium. Possible etiology diuretics: she was taking HCTZ and Lasix. She is asymptomatic -had stopped HCTZ and would not resume again in the future -continued lasix -sodium continues to improve -Recheck bmet in AM   Chronic diastolic CHF (congestive heart failure) ([place]) Last Echo 04/09/2015 with EF 60-65%, Grade III DD. She has been managed with HCTZ and low dose lasix. -D/c HCTZ and would not resume again in the future -continue Lasix 40 mg daily -cont losartan, ASA   Diabetes mellitus type 2, controlled ([place]) Last A1C 10/02/22 7.5%.  -metformin was continued at presentation -Cont SSI as needed  Essential hypertension BP remains stable -Cont lasix, losartan, norvasc  Nausea vomiting -Acute episode of nausea and vomiting this morning. -Patient complains of constipation. -Have ordered abdominal x-ray, pending -Continue antiemetics as needed  for now    Subjective: Patient complains of nausea with vomiting this morning.  Physical Exam: Vitals:   11/05/22 0418 11/05/22 0732 11/05/22 0852 11/05/22 0855  BP: (!) 141/52 (!) 135/43    Pulse: 76 74    Resp: 17 18    Temp: 98 F (36.7 C) 98.7 F (37.1 C)    TempSrc:  Oral    SpO2: 97% 90% 90% 94%  Weight:      Height:       General exam: Conversant, in no acute distress Respiratory system: normal chest rise, clear, no audible wheezing Cardiovascular system: regular rhythm, s1-s2 Gastrointestinal system: Nondistended, nontender, pos BS Central nervous system: No seizures, no tremors Extremities: No cyanosis, no joint deformities Skin: No rashes, no pallor Psychiatry: Affect normal // no auditory hallucinations   Data Reviewed:  Labs reviewed: Na 127, K 4.9, Cr 0.79, WBC 12.8, Hgb 10.0  Family Communication: Pt in room, family not at bedside  Disposition: Status is: Inpatient Remains inpatient appropriate because: Severity of illness  Planned Discharge Destination: Home    Author: Marylu Lund, MD 11/05/2022 2:22 PM  For on call review [email or web address].

## 2022-11-05 NOTE — Progress Notes (Addendum)
Referring Physician(s): Dr Gilberto Better  Supervising Physician: Markus Daft  Patient Status:  [facility] - In-pt  Chief Complaint:  Rt shoulder abscess  Subjective:  IR procedure 3/23: Right shoulder abscesses Procedure: US guided placement of 2 drains in right shoulder abscesses Findings: Large right lateral fluid collection.  Smaller right anterior fluid collections. 10 Fr drain placed in right lateral shoulder abscess, labeled as drain #1.  Removed 130 ml of pink purulent fluid from the drain.  8 Fr drain placed in smaller right anterior abscess, drain #2, and removed 40 ml of yellow purulent fluid. Samples sent for cultures.  Right anterior shoulder collections appear to be communicating with each other and most of right shoulder fluid collections were decompressed by end of procedure.   Pt is doing well; feeling better and less pain in Rt shoulder Drains intact Up in bed Eating breakfast and using phone   Allergies: Augmentin [amoxicillin-pot clavulanate], Bactrim [sulfamethoxazole-trimethoprim], Calan [verapamil], Catapres [clonidine hcl], Codeine, Hydrocodone, Other, Sulfa antibiotics, Zestril [lisinopril], and Azulfidine [sulfasalazine]  Medications: Prior to Admission medications   Medication Sig Start Date End Date Taking? Authorizing Provider  acetaminophen (TYLENOL) 325 MG tablet Take 650 mg by mouth 3 (three) times daily.   Yes [provider]  albuterol (VENTOLIN HFA) 108 (90 Base) MCG/ACT inhaler Inhale 2 puffs into the lungs every 6 (six) hours as needed for wheezing or shortness of breath.   Yes [provider]  amLODipine (NORVASC) 10 MG tablet Take 10 mg by mouth daily. HOLD if SBP < 110.   Yes [provider]  aspirin EC 81 MG tablet Take 162 mg by mouth at bedtime.   Yes [provider]  budesonide (PULMICORT) 0.5 MG/2ML nebulizer solution Take 2 mLs (0.5 mg total) by nebulization in the morning and at bedtime. 10/16/22  Yes Cobb,  Karie Schwalbe, NP  Cholecalciferol (VITAMIN D3) 25 MCG (1000 UT) capsule Take 1,000 Units by mouth every Monday.   Yes [provider]  Cinnamon 500 MG capsule Take 500 mg by mouth daily.   Yes [provider]  dextromethorphan-guaiFENesin (MUCINEX DM) 30-600 MG 12hr tablet Take 1 tablet by mouth 2 (two) times daily. 10/16/22  Yes Cobb, Karie Schwalbe, NP  diclofenac Sodium (VOLTAREN ARTHRITIS PAIN) 1 % GEL Apply 2 g topically 3 (three) times daily. To both shoulders.   Yes [provider]  furosemide (LASIX) 20 MG tablet Take 0.5 tablets (10 mg total) by mouth daily for 10 days. Patient taking differently: Take 20 mg by mouth daily. 10/26/22 11/05/22 Yes Tacy Learn, PA-C  hypromellose (GENTEAL SEVERE) 0.3 % GEL ophthalmic ointment Place 1 Application into both eyes 2 (two) times daily.   Yes [provider]  insulin lispro (HUMALOG) 100 UNIT/ML injection Inject 0-12 Units into the skin 3 (three) times daily before meals. Per sliding scale: If blood sugar is less than 70, call NP/PA. If blood sugar is 70 to 200, give 0 units. If blood sugar is 201 to 250, give 2 units. If blood sugar is 251 to 300, give 4 units. If blood sugar is 301 to 350, give 6 units. If blood sugar is 351 to 400, give 8 units. If blood sugar is 401 to 450, give 10 units. If blood sugar is 451 to 600, give 12 units. Recheck in 2 hours.   Yes [provider]  ipratropium-albuterol (DUONEB) 0.5-2.5 (3) MG/3ML SOLN Take 3 mLs by nebulization 2 (two) times daily. 09/19/22  Yes [provider]  Lactobacillus (PROBIOTIC ACIDOPHILUS) CAPS Take 1 capsule by mouth at bedtime.   Yes [provider]  loratadine (CLARITIN) 10 MG tablet Take 10 mg by mouth at bedtime.   Yes [provider]  losartan (COZAAR) 50 MG tablet Take 50 mg by mouth daily. HOLD for SBP < 110.   Yes [provider]  metFORMIN (GLUCOPHAGE) 500 MG tablet Take 0.5 tablets (250 mg total) by  mouth 2 (two) times daily with a meal. Patient taking differently: Take 500 mg by mouth 2 (two) times daily with a meal. 10/26/22 11/25/22 Yes Tacy Learn, PA-C  Multiple Vitamins-Minerals (PRESERVISION AREDS) CAPS Take 1 capsule by mouth daily.   Yes [provider]  OMEPRAZOLE PO Take 20 mg by mouth daily. Omeprazole 20 mg DR disintegrating tablet.   Yes [provider]  ondansetron (ZOFRAN) 4 MG tablet Take 4 mg by mouth every 8 (eight) hours as needed for nausea or vomiting.   Yes [provider]  Polyethyl Glycol-Propyl Glycol (LUBRICANT EYE DROPS) 0.4-0.3 % SOLN Place 2 drops into both eyes daily.   Yes [provider]  polyethylene glycol powder (GLYCOLAX/MIRALAX) 17 GM/SCOOP powder Take 17 g by mouth at bedtime.   Yes [provider]  polyvinyl alcohol (LIQUIFILM TEARS) 1.4 % ophthalmic solution Place 1 drop into both eyes in the morning, at noon, in the evening, and at bedtime. Following injection treatment   Yes [provider]  guaiFENesin (ROBITUSSIN) 100 MG/5ML liquid Take 10 mLs by mouth every 6 (six) hours as needed for cough or to loosen phlegm. Patient not taking: Reported on 11/04/2022 10/01/22   Barb Merino, MD  LEVEMIR FLEXPEN 100 UNIT/ML FlexPen Inject 5 Units into the skin daily. 11/02/22   [provider]     Vital Signs: BP (!) 135/43 (BP Location: Left Arm)   Pulse 74   Temp 98.7 F (37.1 C) (Oral)   Resp 18   Ht 5\' 4"  (1.626 m)   Wt 190 lb 11.2 oz (86.5 kg)   SpO2 90%   BMI 32.73 kg/m   Physical Exam Skin:    Comments: Rt shoulder abscess drains intact Skin sites are clean and dry No bleeding No redness NT   Flush easily  no pain  OP minimally bloody color     Imaging: IR US Guide Bx Asp/Drain  Result Date: 11/04/2022 INDICATION: 89 year old female with right shoulder periprosthetic fluid collections that are suggestive for abscesses. EXAM: 1. Ultrasound-guided placement of drainage  catheter in lateral right shoulder abscess collection 2. Ultrasound-guided placement of drainage catheter in the anterior right shoulder abscess collection MEDICATIONS: 1% lidocaine for local anesthetic ANESTHESIA/SEDATION: None COMPLICATIONS: None immediate. PROCEDURE: Informed written consent was obtained from the patient's daughter after a thorough discussion of the procedural risks, benefits and alternatives. All questions were addressed. Maximal Sterile Barrier Technique was utilized including caps, mask, sterile gowns, sterile gloves, sterile drape, hand hygiene and skin antiseptic. A timeout was performed prior to the initiation of the procedure. The right shoulder was prepped and draped in sterile fashion. Ultrasound demonstrated a large fluid collection along the lateral aspect of the right shoulder. The skin was anesthetized with 1% lidocaine. Small incision was made. Using ultrasound guidance, a Yueh catheter was directed into the fluid collection and pink purulent fluid was aspirated. Superstiff Amplatz wire was placed and the tract was dilated to accommodate a 10 [place] multipurpose drain. Approximately 130 mL of pink purulent fluid was removed. This collection was decompressed.  Ultrasound of right shoulder demonstrated persistent fluid collections in the anterior right shoulder region. The largest right anterior shoulder fluid collection was targeted. The skin was anesthetized with 1% lidocaine. Small incision was made. Using ultrasound guidance, a Yueh catheter was directed into this anterior fluid collection and yellow purulent fluid was aspirated. Superstiff Amplatz wire was placed and an 8 [employer] drain was advanced over the wire. Approximately 40 mL of fluid was removed from this collection. Ultrasound was also used to evaluate the remainder of the right shoulder. Both drains were sutured to skin with Prolene suture. Both drains were attached to suction bulbs. Fluid sample from each  drain was sent for culture. FINDINGS: There was a very large fluid collection in the lateral aspect of the right shoulder and 130 mL of pink purulent fluid was removed from this collection. This collection was decompressed at the end of the procedure. There were small fluid collections along the anterior aspect of the right shoulder including a dominant collection. An 8 French drain was placed in the dominant anterior right fluid collection and this appeared to decompress the other right anterior fluid collections. 40 mL of yellow purulent fluid was removed from the right anterior shoulder fluid collection. IMPRESSION: 1. Ultrasound-guided placement of a drainage catheter in the right lateral shoulder abscess collection. 2. Ultrasound-guided placement of a drainage catheter in the right anterior shoulder abscess. Electronically Signed   By: Markus Daft M.D.   On: 11/04/2022 15:25   IR US Guide Bx Asp/Drain  Result Date: 11/04/2022 INDICATION: 89 year old female with right shoulder periprosthetic fluid collections that are suggestive for abscesses. EXAM: 1. Ultrasound-guided placement of drainage catheter in lateral right shoulder abscess collection 2. Ultrasound-guided placement of drainage catheter in the anterior right shoulder abscess collection MEDICATIONS: 1% lidocaine for local anesthetic ANESTHESIA/SEDATION: None COMPLICATIONS: None immediate. PROCEDURE: Informed written consent was obtained from the patient's daughter after a thorough discussion of the procedural risks, benefits and alternatives. All questions were addressed. Maximal Sterile Barrier Technique was utilized including caps, mask, sterile gowns, sterile gloves, sterile drape, hand hygiene and skin antiseptic. A timeout was performed prior to the initiation of the procedure. The right shoulder was prepped and draped in sterile fashion. Ultrasound demonstrated a large fluid collection along the lateral aspect of the right shoulder. The skin was  anesthetized with 1% lidocaine. Small incision was made. Using ultrasound guidance, a Yueh catheter was directed into the fluid collection and pink purulent fluid was aspirated. Superstiff Amplatz wire was placed and the tract was dilated to accommodate a 10 [place] multipurpose drain. Approximately 130 mL of pink purulent fluid was removed. This collection was decompressed. Ultrasound of right shoulder demonstrated persistent fluid collections in the anterior right shoulder region. The largest right anterior shoulder fluid collection was targeted. The skin was anesthetized with 1% lidocaine. Small incision was made. Using ultrasound guidance, a Yueh catheter was directed into this anterior fluid collection and yellow purulent fluid was aspirated. Superstiff Amplatz wire was placed and an 8 [employer] drain was advanced over the wire. Approximately 40 mL of fluid was removed from this collection. Ultrasound was also used to evaluate the remainder of the right shoulder. Both drains were sutured to skin with Prolene suture. Both drains were attached to suction bulbs. Fluid sample from each drain was sent for culture. FINDINGS: There was a very large fluid collection in the lateral aspect of the right shoulder and 130 mL of pink purulent fluid was removed from this  collection. This collection was decompressed at the end of the procedure. There were small fluid collections along the anterior aspect of the right shoulder including a dominant collection. An 8 French drain was placed in the dominant anterior right fluid collection and this appeared to decompress the other right anterior fluid collections. 40 mL of yellow purulent fluid was removed from the right anterior shoulder fluid collection. IMPRESSION: 1. Ultrasound-guided placement of a drainage catheter in the right lateral shoulder abscess collection. 2. Ultrasound-guided placement of a drainage catheter in the right anterior shoulder abscess.  Electronically Signed   By: Markus Daft M.D.   On: 11/04/2022 15:25   VAS [place] LOWER EXTREMITY VENOUS (DVT) (7a-7p)  Result Date: 11/04/2022  Lower Venous DVT Study Patient Name:  Heidi Dominguez  Date of Exam:   11/03/2022 Medical Rec #: [id number]        Accession #:    [id number] Date of Birth: Feb 14, 1921        Patient Gender: F Patient Age:   89 years Exam Location:  [facility] Procedure:      VAS [place] LOWER EXTREMITY VENOUS (DVT) Referring Phys: ADAM CURATOLO --------------------------------------------------------------------------------  Indications: Pain, Edema, and Chronic swelling.  Risk Factors: Surgery Lower ext bypass graft, left leg stenting. Comparison Study: 03/16/21 - Negative LE venous Performing Technologist: Velva Harman Sturdivant RDMS, RVT  Examination Guidelines: A complete evaluation includes B-mode imaging, spectral Doppler, color Doppler, and power Doppler as needed of all accessible portions of each vessel. Bilateral testing is considered an integral part of a complete examination. Limited examinations for reoccurring indications may be performed as noted. The reflux portion of the exam is performed with the patient in reverse Trendelenburg.  +---------+---------------+---------+-----------+----------+-------------------+ RIGHT    CompressibilityPhasicitySpontaneityPropertiesThrombus Aging      +---------+---------------+---------+-----------+----------+-------------------+ CFV      Full           Yes      Yes                                      +---------+---------------+---------+-----------+----------+-------------------+ SFJ      Full                                                             +---------+---------------+---------+-----------+----------+-------------------+ FV Prox  Full                                                             +---------+---------------+---------+-----------+----------+-------------------+ FV Mid   Full                                                              +---------+---------------+---------+-----------+----------+-------------------+ FV DistalFull                                                             +---------+---------------+---------+-----------+----------+-------------------+  PFV      Full                                                             +---------+---------------+---------+-----------+----------+-------------------+ POP      Full           Yes      Yes                                      +---------+---------------+---------+-----------+----------+-------------------+ PTV      Full                                                             +---------+---------------+---------+-----------+----------+-------------------+ PERO                                                  Not well visualized +---------+---------------+---------+-----------+----------+-------------------+   Right Technical Findings: Bilateral calf veins were not well visualized. Cannot exclude chronic DVT in the calf veins.  +---------+---------------+---------+-----------+----------+-------------------+ LEFT     CompressibilityPhasicitySpontaneityPropertiesThrombus Aging      +---------+---------------+---------+-----------+----------+-------------------+ CFV      Full           Yes      Yes                                      +---------+---------------+---------+-----------+----------+-------------------+ SFJ      Full                                                             +---------+---------------+---------+-----------+----------+-------------------+ FV Prox  Full                                                             +---------+---------------+---------+-----------+----------+-------------------+ FV Mid   Full                                                             +---------+---------------+---------+-----------+----------+-------------------+  FV DistalFull                                                             +---------+---------------+---------+-----------+----------+-------------------+  PFV      Full                                                             +---------+---------------+---------+-----------+----------+-------------------+ POP      Full           Yes      Yes                                      +---------+---------------+---------+-----------+----------+-------------------+ PTV      Full                                                             +---------+---------------+---------+-----------+----------+-------------------+ PERO                                                  Not well visualized +---------+---------------+---------+-----------+----------+-------------------+     Summary: BILATERAL: - No evidence of acute deep vein thrombosis seen in the lower extremities, bilaterally. -No evidence of popliteal cyst, bilaterally. RIGHT: -   *See table(s) above for measurements and observations. Electronically signed by Orlie Pollen on 11/04/2022 at 10:55:07 AM.    Final    CT SHOULDER RIGHT W CONTRAST  Result Date: 11/03/2022 CLINICAL DATA:  Septic arthritis suspected, shoulder, xray done. Leukocytosis and elevated serum inflammatory markers. EXAM: CT OF THE UPPER RIGHT EXTREMITY WITH CONTRAST TECHNIQUE: Multidetector CT imaging of the upper right extremity was performed according to the standard protocol following intravenous contrast administration. RADIATION DOSE REDUCTION: This exam was performed according to the departmental dose-optimization program which includes automated exposure control, adjustment of the mA and/or kV according to patient size and/or use of iterative reconstruction technique. CONTRAST:  23mL OMNIPAQUE IOHEXOL 350 MG/ML SOLN COMPARISON:  X-ray 11/03/2022, CT 05/27/2021 FINDINGS: Bones/Joint/Cartilage Long stem right humeral prosthesis. The inferior tip of the  prosthesis is not included within the field of view. Chronic superomedial migration of the humeral head component with extensive osseous remodeling of the scapula and distal clavicle. Chronic osteolysis involving the proximal humeral diaphysis, not significantly progressed. No acute fracture. Os acromiale. Ligaments Suboptimally assessed by CT. Muscles and Tendons Findings of chronic rotator cuff tears with chronic muscle atrophy. Soft tissues Large peripherally enhancing periprosthetic fluid collection surrounding the proximal right humerus measuring approximately 14 x 5 cm in transaxial dimension. Loculated component at the anterior shoulder extends approximately 10 cm in craniocaudal dimension. Collection closely approximates the skin surface at the anterior aspect of the shoulder. Loculated component at the posterior aspect of the shoulder measures approximately 8 cm in craniocaudal dimension. Mildly prominent right axillary lymph nodes are likely reactive. Small layering right-sided pleural effusion. Multinodular goiter, similar to the previous study. In the setting of significant comorbidities or limited life expectancy, no follow-up recommended (ref: J Am Coll Radiol. 2015 Feb;12(2): 143-50). IMPRESSION: 1. Large peripherally-enhancing periprosthetic fluid collection surrounding the proximal right humerus measuring up  to 14 cm in maximal dimension. Findings are most concerning for infected fluid collection/abscess given the patient's history. Aspiration and joint fluid analysis is recommended. 2. Chronic superomedial migration of the humeral head component with extensive osseous remodeling of the scapula and distal clavicle. Chronic osteolysis involving the proximal humeral diaphysis, not significantly progressed. 3. Small layering right-sided pleural effusion. Electronically Signed   By: Davina Poke D.O.   On: 11/03/2022 15:09   DG Chest 2 View  Result Date: 11/03/2022 CLINICAL DATA:  Chest pain.  EXAM: CHEST - 2 VIEW COMPARISON:  October 25, 2022. FINDINGS: Stable cardiomegaly. Large hiatal hernia is noted. Minimal right basilar subsegmental atelectasis is noted. Status post bilateral shoulder arthroplasties. IMPRESSION: Minimal right basilar subsegmental atelectasis. Large hiatal hernia. Electronically Signed   By: Marijo Conception M.D.   On: 11/03/2022 12:06   DG Shoulder Right  Result Date: 11/03/2022 CLINICAL DATA:  Pain EXAM: RIGHT SHOULDER - 3 VIEW COMPARISON:  None Available. FINDINGS: There is a right shoulder prosthesis. There appears to be grossly anatomic alignment. There is absence of the acromial humeral distance indicating the presence of rotator cuff defect. No acute fracture, dislocation or subluxation identified. IMPRESSION: Status post right shoulder arthroplasty. No acute osseous abnormalities are seen. Electronically Signed   By: Sammie Bench M.D.   On: 11/03/2022 12:03    Labs:  CBC: Recent Labs    09/29/22 0028 10/25/22 2234 10/25/22 2242 10/25/22 2329 11/03/22 1136 11/05/22 0443  WBC 4.4 13.1*  --   --  15.5* 12.8*  HGB 11.6* 11.4* 12.6 12.6 11.2* 10.0*  HCT 37.6 35.7* 37.0 37.0 34.3* 31.0*  PLT 221 228  --   --  370 391    COAGS: No results for input(s): "INR", "APTT" in the last 8760 hours.  BMP: Recent Labs    10/25/22 2234 10/25/22 2242 10/25/22 2329 11/03/22 1136 11/04/22 0135 11/05/22 0443  NA 122*   < > 124* 122* 125* 127*  K 3.9   < > 3.4* 5.1 5.1 4.9  CL 82*  --   --  84* 86* 90*  CO2 22  --   --  27 31 29   GLUCOSE 560*  --   --  307* 151* 121*  BUN 28*  --   --  17 15 18   CALCIUM 9.0  --   --  8.4* 8.7* 8.3*  CREATININE 0.81  --   --  0.75 0.79 0.79  GFRNONAA >60  --   --  >60 >60 >60   < > = values in this interval not displayed.    LIVER FUNCTION TESTS: Recent Labs    09/29/22 0028 10/25/22 2234 11/03/22 1136 11/05/22 0443  BILITOT 0.3 QUANTITY NOT SUFFICIENT, UNABLE TO PERFORM TEST 0.5 0.8  AST 14* 14* 23 13*  ALT 10  12 11 11   ALKPHOS 78 158* 87 65  PROT 7.0 6.3* 6.1* 5.8*  ALBUMIN 2.5* 2.1* 1.9* 1.7*    Drain Location: Right shoulder abscesses Size: Rt lateral abscess 10 Fr; Rt anterior abscess 8 Fr Date of placement: 3/23  Currently to: Drain collection device: suction bulb x 2 24 hour output:  Output by Drain (mL) 11/03/22 0701 - 11/03/22 1900 11/03/22 1901 - 11/04/22 0700 11/04/22 0701 - 11/04/22 1900 11/04/22 1901 - 11/05/22 0700 11/05/22 0701 - 11/05/22 0932  Closed System Drain 1 Right;Lateral;Posterior Shoulder Bulb (JP)   190 30   Closed System Drain 2 Right;Anterior Shoulder Bulb (JP) 8.5 Fr.   70 10  Interval imaging/drain manipulation:  None  Current examination: Flushes/aspirates easily.  Insertion site unremarkable. Suture and stat lock in place. Dressed appropriately.  OP reddish color for both Rt more than Left  Plan: Continue TID flushes with 5 cc NS. Record output Q shift. Dressing changes QD or PRN if soiled.  Call IR APP or on call IR MD if difficulty flushing or sudden change in drain output.  Repeat imaging/possible drain injection once output < 10 mL/QD (excluding flush material). Consideration for drain removal if output is < 10 mL/QD (excluding flush material), pending discussion with the providing surgical service.  Discharge planning: Please contact IR APP or on call IR MD prior to patient d/c to ensure appropriate follow up plans are in place. Typically patient will follow up with IR clinic 10-14 days post d/c for repeat imaging/possible drain injection. IR scheduler will contact patient with date/time of appointment. Patient will need to flush drain QD with 5 cc NS, record output QD, dressing changes every 2-3 days or earlier if soiled.   IR will continue to follow - please call with questions or concerns.  Assessment and Plan:  Right shoulder abscess Drains placed in IR 3/23 OP great; flushing easily To remain in place until DC---please let IR know of  DC plan  Electronically Signed: Lavonia Drafts, PA-C 11/05/2022, 9:31 AM   I spent a total of 15 Minutes at the the patient's bedside AND on the patient's hospital floor or unit, greater than 50% of which was counseling/coordinating care for Rt shoulder abscesses

## 2022-11-06 DIAGNOSIS — E871 Hypo-osmolality and hyponatremia: Secondary | ICD-10-CM | POA: Diagnosis not present

## 2022-11-06 DIAGNOSIS — M00811 Arthritis due to other bacteria, right shoulder: Secondary | ICD-10-CM | POA: Diagnosis not present

## 2022-11-06 DIAGNOSIS — M25511 Pain in right shoulder: Secondary | ICD-10-CM | POA: Diagnosis not present

## 2022-11-06 LAB — CBC
HCT: 31.7 % — ABNORMAL LOW (ref 36.0–46.0)
Hemoglobin: 10 g/dL — ABNORMAL LOW (ref 12.0–15.0)
MCH: 27 pg (ref 26.0–34.0)
MCHC: 31.5 g/dL (ref 30.0–36.0)
MCV: 85.7 fL (ref 80.0–100.0)
Platelets: 375 10*3/uL (ref 150–400)
RBC: 3.7 MIL/uL — ABNORMAL LOW (ref 3.87–5.11)
RDW: 15.8 % — ABNORMAL HIGH (ref 11.5–15.5)
WBC: 12.6 10*3/uL — ABNORMAL HIGH (ref 4.0–10.5)
nRBC: 0 % (ref 0.0–0.2)

## 2022-11-06 LAB — COMPREHENSIVE METABOLIC PANEL
ALT: 12 U/L (ref 0–44)
AST: 14 U/L — ABNORMAL LOW (ref 15–41)
Albumin: 1.7 g/dL — ABNORMAL LOW (ref 3.5–5.0)
Alkaline Phosphatase: 65 U/L (ref 38–126)
Anion gap: 7 (ref 5–15)
BUN: 13 mg/dL (ref 8–23)
CO2: 26 mmol/L (ref 22–32)
Calcium: 8.2 mg/dL — ABNORMAL LOW (ref 8.9–10.3)
Chloride: 91 mmol/L — ABNORMAL LOW (ref 98–111)
Creatinine, Ser: 0.6 mg/dL (ref 0.44–1.00)
GFR, Estimated: >60 mL/min (ref >60)
Glucose, Bld: 160 mg/dL — ABNORMAL HIGH (ref 70–99)
Potassium: 4.6 mmol/L (ref 3.5–5.1)
Sodium: 124 mmol/L — ABNORMAL LOW (ref 135–145)
Total Bilirubin: 0.6 mg/dL (ref 0.3–1.2)
Total Protein: 5.8 g/dL — ABNORMAL LOW (ref 6.5–8.1)

## 2022-11-06 LAB — GLUCOSE, CAPILLARY
Glucose-Capillary: 173 mg/dL — ABNORMAL HIGH (ref 70–99)
Glucose-Capillary: 193 mg/dL — ABNORMAL HIGH (ref 70–99)
Glucose-Capillary: 204 mg/dL — ABNORMAL HIGH (ref 70–99)
Glucose-Capillary: 169 mg/dL — ABNORMAL HIGH (ref 70–99)

## 2022-11-06 LAB — OSMOLALITY: Osmolality: 271 mOsm/kg — ABNORMAL LOW (ref 275–295)

## 2022-11-06 MED ORDER — POLYETHYLENE GLYCOL 3350 17 G PO PACK
17.0000 g | PACK | Freq: Two times a day (BID) | ORAL | Status: DC
  Administered 2022-11-06 – 2022-11-17 (×21): 17 g via ORAL
  Filled 2022-11-06 (×20): qty 1

## 2022-11-06 MED ORDER — DOCUSATE SODIUM 100 MG PO CAPS
100.0000 mg | ORAL_CAPSULE | Freq: Two times a day (BID) | ORAL | Status: DC
  Administered 2022-11-06 – 2022-11-17 (×20): 100 mg via ORAL
  Filled 2022-11-06 (×23): qty 1

## 2022-11-06 NOTE — Evaluation (Signed)
Physical Therapy Evaluation Patient Details Name: Heidi Dominguez MRN: [id number] DOB: 08/09/1921 Today's Date: 11/06/2022  History of Present Illness  Heidi Dominguez is a 89 y.o. female admitted on 11/03/2022 with shoulder infection. 2x drains placed. PMHx: arthritis, CHF, CKD, Dyspnea, GERD, HOH, HTN, OA, bilateral shoulder replacements  Clinical Impression  Difficult to assess pt baseline due to possible cognitive deficits. Pt does report that she was working on standing with PT at long term care facility. When attempting to stand pt gave no signs of initiation to assist despite Maximum multi modal cueing. Pt was able to perform bed mobility at Max A to total assist for sit<>supine and Mod A for rolling with initiation cueing and heavy verbal/tactile cueing throughout. Due to pt current functional status, home set up and available assistance at home recommending to return to SNF with skilled physical therapy services to address functional mobility in order to improve independence and decrease burden of care. Pt became nauseous with sitting and eventually stated she needed to use the bed pan. Pt has been constipated and most likely movement caused bowel motility to increase. [name] was assisted with placing pt on bed pan prior to leaving the room. ADDENDUM: Heidi Neighbors LCSW spoke with pt daughter and pt has been attending a SNF not long term care. Pt will benefit from skilled physical therapy services at a higher level of care and frequency in the skilled nursing setting where she had been previously.         Recommendations for follow up therapy are one component of a multi-disciplinary discharge planning process, led by the attending physician.  Recommendations may be updated based on patient status, additional functional criteria and insurance authorization.  Follow Up Recommendations Can patient physically be transported by private vehicle: No     Assistance Recommended at Discharge Frequent or  constant Supervision/Assistance  Patient can return home with the following  Two people to help with walking and/or transfers;Assist for transportation;Help with stairs or ramp for entrance;Assistance with cooking/housework    Equipment Recommendations Other (comment) (defer to post acute)  Recommendations for Other Services       Functional Status Assessment Patient has had a recent decline in their functional status and demonstrates the ability to make significant improvements in function in a reasonable and predictable amount of time.     Precautions / Restrictions Precautions Precautions: Fall Restrictions Weight Bearing Restrictions: No      Mobility  Bed Mobility Overal bed mobility: Needs Assistance Bed Mobility: Sidelying to Sit, Rolling, Sit to Supine Rolling: Mod assist Sidelying to sit: Max assist   Sit to supine: Total assist   General bed mobility comments: pt was Max A with Mod a at the bil LE and Min A at the trunk for side lying to sitting. Pt was then Total assist for  going from sitting to supine. Once in supine pt was able to move her legs to the L very well. She needed assistance to move her legs to the R toward EOB. Pt was Mod a for rolling R/L to get on bed pan with Mod initiaion at the contra lateral LE. Patient Response: Cooperative  Transfers Overall transfer level: Needs assistance   Transfers: Sit to/from Stand             General transfer comment: Attempted to stand. Assisted with foot placement and pt makes no attempt at assisting to get to standing. No notable muscle activation, fwd lean, pushing onto the bed. Attempted  2x but due to lack of pt participation and pt reporting she needs the bed pan return to supine position.    Ambulation/Gait               General Gait Details: Pt was non-ambulatory prior to hosptlization working on standing with PT at long term care facility      Balance Overall balance assessment: Needs  assistance Sitting-balance support: Feet supported Sitting balance-Leahy Scale: Fair Sitting balance - Comments: No LOB with feet on and off the floor.       Standing balance comment: unable to get to standing.         Pertinent Vitals/Pain Pain Assessment Pain Assessment: Faces Faces Pain Scale: Hurts even more Pain Location: R shoulder Pain Descriptors / Indicators: Discomfort, Grimacing Pain Intervention(s): Monitored during session, Limited activity within patient's tolerance    Home Living Family/patient expects to be discharged to:: Skilled nursing facility       Additional Comments: LTC, pt states she has been working with PT    Prior Function Prior Level of Function : Needs assist       Mobility Comments: Per pt staff assists with SP transfer and she spends most of the day in the [facility]. Pt reports being active with PT working on standing ADLs Comments: Staff assists with all ADLs     Hand Dominance   Dominant Hand: Right    Extremity/Trunk Assessment   Upper Extremity Assessment Upper Extremity Assessment: Defer to OT evaluation RUE Deficits / Details: drians, very minimal AROM. guarding in flexor pattern against body. globally weak RUE Coordination: decreased fine motor;decreased gross motor LUE Deficits / Details: more AROM at shoulder than R, still limited due to pain. elbow, wirst and hand ROM are WFL LUE Coordination: decreased fine motor;decreased gross motor    Lower Extremity Assessment Lower Extremity Assessment: Generalized weakness    Cervical / Trunk Assessment Cervical / Trunk Assessment: Kyphotic  Communication   Communication: HOH  Cognition Arousal/Alertness: Awake/alert Behavior During Therapy: WFL for tasks assessed/performed Overall Cognitive Status: No family/caregiver present to determine baseline cognitive functioning       General Comments: difficult to assess due to no family was present. Most likely some cognitive deficits  as demonstrated by pt participation and ability to follow directions.        General Comments General comments (skin integrity, edema, etc.): Pt has been nauseous due to constipation. On sitting EOB pt started to cough and stated she was throwing up; nothing of substance came up despite pt stating that she ate some breakfast. Pt was spitting out sputum in emesis bag.        Assessment/Plan    PT Assessment Patient needs continued PT services  PT Problem List Decreased strength;Decreased mobility;Decreased safety awareness;Decreased activity tolerance;Decreased balance;Decreased cognition;Pain       PT Treatment Interventions DME instruction;Therapeutic activities;Gait training;Therapeutic exercise;Patient/family education;Balance training;Functional mobility training;Neuromuscular re-education;Wheelchair mobility training    PT Goals (Current goals can be found in the Care Plan section)  Acute Rehab PT Goals Patient Stated Goal: To continue to work with PT and get stronger PT Goal Formulation: With patient Time For Goal Achievement: 11/20/22 Potential to Achieve Goals: Fair    Frequency Min 2X/week        AM-PAC PT "6 Clicks" Mobility  Outcome Measure Help needed turning from your back to your side while in a flat bed without using bedrails?: A Lot Help needed moving from lying on your back to sitting on the  side of a flat bed without using bedrails?: A Lot Help needed moving to and from a bed to a chair (including a wheelchair)?: Total Help needed standing up from a chair using your arms (e.g., wheelchair or bedside chair)?: Total Help needed to walk in hospital room?: Total Help needed climbing 3-5 steps with a railing? : Total 6 Click Score: 8    End of Session Equipment Utilized During Treatment: Gait belt Activity Tolerance: Other (comment) (pt limited by nausea and needing to use bedpan) Patient left: with bed alarm set;with call bell/phone within reach;in bed Nurse  Communication: Mobility status PT Visit Diagnosis: Other abnormalities of gait and mobility (R26.89)    Time: [id number] PT Time Calculation (min) (ACUTE ONLY): 34 min   Charges:   PT Evaluation $PT Eval Low Complexity: 1 Low PT Treatments $Therapeutic Activity: 8-22 mins       Tomma Rakers, DPT, [place] Office: [phone] (Secure chat preferred)   Ander Purpura 11/06/2022, 11:43 AM

## 2022-11-06 NOTE — Evaluation (Signed)
Occupational Therapy Evaluation Patient Details Name: Heidi Dominguez MRN: [id number] DOB: 1921/03/21 Today's Date: 11/06/2022   History of Present Illness Heidi Dominguez is a 89 y.o. female admitted on 11/03/2022 with shoulder infection. 2x drains placed. PMHx: arthritis, CHF, CKD, Dyspnea, GERD, HOH, HTN, OA, bilateral shoulder replacements   Clinical Impression   [name] was evaluated s/p the above admission list. She is from SNF, WC level and needs assist for ADLs at baseline. Pt did state that she has been working on "standing" with PT. Upon evaluation she was limited by bilateral shoulder limitations, pain, weakness, and poor activity tolerance. Overall she required max-total A for be mobility, once sitting EOB with feet supported, she was min G for sitting balance. Due to the deficits listed below, she requires max A for UB ADLs and total A for LB ADLs at bed level. Pt will benefit from continued acute OT services. Recommend d/c back to SNF.       Recommendations for follow up therapy are one component of a multi-disciplinary discharge planning process, led by the attending physician.  Recommendations may be updated based on patient status, additional functional criteria and insurance authorization.   Assistance Recommended at Discharge Frequent or constant Supervision/Assistance  Patient can return home with the following A lot of help with walking and/or transfers;Two people to help with walking and/or transfers;A lot of help with bathing/dressing/bathroom;Two people to help with bathing/dressing/bathroom;Assistance with cooking/housework;Assistance with feeding;Direct supervision/assist for medications management;Direct supervision/assist for financial management;Assist for transportation;Help with stairs or ramp for entrance    Functional Status Assessment  Patient has had a recent decline in their functional status and demonstrates the ability to make significant improvements in function  in a reasonable and predictable amount of time.  Equipment Recommendations  None recommended by OT (defer to SNF)       Precautions / Restrictions Precautions Precautions: Fall Restrictions Weight Bearing Restrictions: No      Mobility Bed Mobility Overal bed mobility: Needs Assistance Bed Mobility: Sidelying to Sit, Rolling, Sit to Supine Rolling: Max assist Sidelying to sit: Max assist   Sit to supine: Total assist        Transfers                   General transfer comment: deferred      Balance Overall balance assessment: Needs assistance Sitting-balance support: Feet supported Sitting balance-Leahy Scale: Fair Sitting balance - Comments: min G once bilat feet supported on the floor                                   ADL either performed or assessed with clinical judgement   ADL Overall ADL's : Needs assistance/impaired Eating/Feeding: Minimal assistance;Sitting   Grooming: Minimal assistance;Sitting   Upper Body Bathing: Maximal assistance;Sitting   Lower Body Bathing: Total assistance;+2 for physical assistance;+2 for safety/equipment;Bed level   Upper Body Dressing : Maximal assistance;Sitting   Lower Body Dressing: +2 for physical assistance;Total assistance;+2 for safety/equipment;Bed level   Toilet Transfer: Total assistance;+2 for physical assistance;+2 for safety/equipment   Toileting- Clothing Manipulation and Hygiene: Total assistance;+2 for physical assistance;+2 for safety/equipment       Functional mobility during ADLs: Maximal assistance;+2 for physical assistance;+2 for safety/equipment (bed level) General ADL Comments: limited by bilateral shoulder pain and decreased ROM, HOH, global weakness, poor activity tolerance     Vision Baseline Vision/History: 0 No visual deficits Vision Assessment?:  No apparent visual deficits Additional Comments: did nto fully assess     Perception Perception Perception Tested?:  No   Praxis Praxis Praxis tested?: Not tested    Pertinent Vitals/Pain Pain Assessment Pain Assessment: Faces Faces Pain Scale: Hurts even more Pain Location: R shoulder Pain Descriptors / Indicators: Discomfort Pain Intervention(s): Limited activity within patient's tolerance, Monitored during session     Hand Dominance Right   Extremity/Trunk Assessment Upper Extremity Assessment Upper Extremity Assessment: RUE deficits/detail;LUE deficits/detail RUE Deficits / Details: drians, very minimal AROM. guarding in flexor pattern against body. globally weak RUE Coordination: decreased fine motor;decreased gross motor LUE Deficits / Details: more AROM at shoulder than R, still limited due to pain. elbow, wirst and hand ROM are WFL LUE Coordination: decreased fine motor;decreased gross motor   Lower Extremity Assessment Lower Extremity Assessment: Defer to PT evaluation   Cervical / Trunk Assessment Cervical / Trunk Assessment: Kyphotic   Communication Communication Communication: HOH   Cognition Arousal/Alertness: Awake/alert Behavior During Therapy: WFL for tasks assessed/performed Overall Cognitive Status: Difficult to assess                                 General Comments: difficult to assess due to [facility], follows most simple commands. slowed processing     General Comments  VSS on RA            Home Living Family/patient expects to be discharged to:: Skilled nursing facility                                 Additional Comments: LTC, pt states she has been working with PT      Prior Functioning/Environment Prior Level of Function : Needs assist             Mobility Comments: Per pt staff assists with SP transfer and she spends most of the day in the [facility]. Pt reports being active with PT working on standing ADLs Comments: Staff assists with all ADLs        OT Problem List: Decreased strength;Decreased range of motion;Decreased  activity tolerance;Impaired balance (sitting and/or standing);Decreased knowledge of use of DME or AE;Decreased knowledge of precautions;Impaired UE functional use;Pain      OT Treatment/Interventions: Self-care/ADL training;Therapeutic exercise;DME and/or AE instruction;Therapeutic activities;Patient/family education;Balance training    OT Goals(Current goals can be found in the care plan section) Acute Rehab OT Goals Patient Stated Goal: less pain OT Goal Formulation: With patient Time For Goal Achievement: 11/20/22 Potential to Achieve Goals: Good ADL Goals Pt Will Perform Grooming: with set-up;sitting Pt Will Perform Upper Body Dressing: with min assist;sitting Additional ADL Goal #1: Pt will complete bed mobility with min A as a precursor to ADLs  OT Frequency: Min 2X/week       AM-PAC OT "6 Clicks" Daily Activity     Outcome Measure Help from another person eating meals?: A Little Help from another person taking care of personal grooming?: A Little Help from another person toileting, which includes using toliet, bedpan, or urinal?: Total Help from another person bathing (including washing, rinsing, drying)?: A Lot Help from another person to put on and taking off regular upper body clothing?: A Lot Help from another person to put on and taking off regular lower body clothing?: Total 6 Click Score: 12   End of Session Nurse Communication: Mobility status  Activity Tolerance:  Patient tolerated treatment well Patient left: in bed;with call bell/phone within reach;with bed alarm set  OT Visit Diagnosis: Unsteadiness on feet (R26.81);Other abnormalities of gait and mobility (R26.89);Muscle weakness (generalized) (M62.81);Pain                Time: [id number] OT Time Calculation (min): 23 min Charges:  OT General Charges $OT Visit: 1 Visit OT Evaluation $OT Eval Moderate Complexity: 1 Mod OT Treatments $Self Care/Home Management : 8-22 mins  .Shade Flood, OTR/L Acute  Rehabilitation Services Office [phone] Secure Chat Communication Preferred   Elliot Cousin 11/06/2022, 10:33 AM

## 2022-11-06 NOTE — Evaluation (Signed)
Clinical/Bedside Swallow Evaluation Patient Details  Name: Heidi Dominguez MRN: [id number] Date of Birth: 1920-10-19  Today's Date: 11/06/2022 Time: SLP Start Time (ACUTE ONLY): 1252 SLP Stop Time (ACUTE ONLY): [id number] SLP Time Calculation (min) (ACUTE ONLY): 10 min  Past Medical History:  Past Medical History:  Diagnosis Date   Acute pancreatitis    hx of    Arthritis    "qwhere" (07/04/2018)   Chronic back pain    "all over" (07/04/2018)   Chronic diastolic CHF (congestive heart failure) (HCC)    Chronic kidney disease    stage  3 chronic kidney disease    Complication of anesthesia    "I have a hard time waking up"   Degenerative joint disease of shoulder region    Dry eye syndrome    Dyspnea    Family history of adverse reaction to anesthesia    "daughter has the shakes when she wakes up"   GERD (gastroesophageal reflux disease)    Heart murmur    High cholesterol    History of hiatal hernia    HOH (hard of hearing)    Hypertension    Impacted cerumen of both ears    hx of    Muscle weakness (generalized)    Osteoarthritis    Overactive bladder    Pancreatitis    hx of    Phlebitis    "BLE"   Rotator cuff tear    right    Tear of right supraspinatus tendon    Thrombocytopenia (HCC)    hx of    Type II diabetes mellitus (HCC)    Urinary tract infection    hx of    UTI (urinary tract infection) 09/12/2018   Xerosis cutis    hx of    Past Surgical History:  Past Surgical History:  Procedure Laterality Date   ABDOMINAL AORTOGRAM W/LOWER EXTREMITY N/A 12/22/2020   Procedure: ABDOMINAL AORTOGRAM W/LOWER EXTREMITY;  Surgeon: Cherre Robins, MD;  Location: [place] [facility];  Service: Cardiovascular;  Laterality: N/A;   ABDOMINAL HYSTERECTOMY     BACK SURGERY     CATARACT EXTRACTION W/ INTRAOCULAR LENS  IMPLANT, BILATERAL Bilateral    CHOLECYSTECTOMY  2016   ERCP N/A 08/30/2018   Procedure: ENDOSCOPIC RETROGRADE CHOLANGIOPANCREATOGRAPHY (ERCP);  Surgeon:  Clarene Essex, MD;  Location: [facility];  Service: Endoscopy;  Laterality: N/A;   FIXATION KYPHOPLASTY     INCISION AND DRAINAGE Right 11/08/2018   Procedure: INCISION AND DRAINAGE right shoulder, placement of antibiotic beads ;  Surgeon: [name], MD;  Location: [facility];  Service: Orthopedics;  Laterality: Right;   IR US GUIDE BX ASP/DRAIN  11/04/2022   IR US GUIDE BX ASP/DRAIN  11/04/2022   JOINT REPLACEMENT     LAPAROSCOPIC CHOLECYSTECTOMY SINGLE SITE WITH INTRAOPERATIVE CHOLANGIOGRAM N/A 04/11/2015   Procedure: LAPAROSCOPIC LYSIS OF ADHESIONS, LAPAROSCOPIC CHOLECYSTECTOMY WITH INTRAOPERATIVE CHOLANGIOGRAM;  Surgeon: Michael Boston, MD;  Location: [facility];  Service: General;  Laterality: N/A;   PANCREATIC STENT PLACEMENT  08/30/2018   Procedure: PANCREATIC STENT PLACEMENT;  Surgeon: Clarene Essex, MD;  Location: [facility];  Service: Endoscopy;;   PERIPHERAL VASCULAR INTERVENTION  12/22/2020   Procedure: PERIPHERAL VASCULAR INTERVENTION;  Surgeon: Cherre Robins, MD;  Location: [place] [facility];  Service: Cardiovascular;;  left anterior tibial artery   REMOVAL OF STONES  08/30/2018   Procedure: REMOVAL OF STONES;  Surgeon: Clarene Essex, MD;  Location: [facility];  Service: Endoscopy;;   SHOULDER OPEN ROTATOR CUFF REPAIR Bilateral  SPHINCTEROTOMY  08/30/2018   Procedure: SPHINCTEROTOMY;  Surgeon: Clarene Essex, MD;  Location: [facility];  Service: Endoscopy;;   TOTAL KNEE ARTHROPLASTY Bilateral    HPI:  Heidi Dominguez is a 89 y.o. female admitted on 11/03/2022 with shoulder infection. 2x drains placed. She is known to SLP services and has had several swallowing evaluations in the last three months. She has a hx of esophageal dysphagia. MBS 07/20/22: oral and ororpharyngeal function WNL, particularly for age. After a few ounces of solids and liquids pt began belching and reported pressure; esophageal sweep showed barium throughout esophageal column. Pt followed by outpatient GI and no SLP  follow up was recommended. Bedside swallow evaluation 09/29/22 with continued functional oropharyngeal swallow but concerns for post-prandial aspiration with ongoing c/o globus, regurgitation, belching. Dysphagia 3/thin liquids was recommended.    Assessment / Plan / Recommendation  Clinical Impression  Pt's swallow function remains consistent with prior assessments. Oropharyngeal swallow appears to be strong. Symptoms are consistent with a primary esophageal dysphagia.  She continues to present with frequent eructation throughout meal. She verbalizes the benefit of following solids with liquids. Aspiration, if it occurs, is likely post-prandial as a result of backflow from the esophagus.  Continue current diet. Sit up during and for 30-45 minutes after meals. Keep HOB elevated during  sleep. Avoid eating two hours before bedtime. D/W RN and posted precautions over [place]. No further SLP f/u is needed. Our service will sign off. SLP Visit Diagnosis: Dysphagia, unspecified (R13.10)    Aspiration Risk  Mild aspiration risk    Diet Recommendation   Regular solids. Thin liquids  Medication Administration: Whole meds with puree    Other  Recommendations Oral Care Recommendations: Oral care BID    Recommendations for follow up therapy are one component of a multi-disciplinary discharge planning process, led by the attending physician.  Recommendations may be updated based on patient status, additional functional criteria and insurance authorization.  Follow up Recommendations No SLP follow up        [place] Date of Onset: 11/03/22 HPI: Heidi Dominguez is a 89 y.o. female admitted on 11/03/2022 with shoulder infection. 2x drains placed. She is known to SLP services and has had several swallowing evaluations in the last three months. She has a hx of esophageal dysphagia. MBS 07/20/22: oral and ororpharyngeal function WNL, particularly for age. After a few ounces of solids and liquids pt  began belching and reported pressure; esophageal sweep showed barium throughout esophageal column. Pt followed by outpatient GI and no SLP follow up was recommended. Bedside swallow evaluation 09/29/22 with continued functional oropharyngeal swallow but concerns for post-prandial aspiration with ongoing c/o globus, regurgitation, belching. Dysphagia 3/thin liquids was recommended. Type of Study: Bedside Swallow Evaluation Previous Swallow Assessment: see HPI Diet Prior to this Study: Regular;Thin liquids (Level 0) Temperature Spikes Noted: No Respiratory Status: Room air History of Recent Intubation: No Behavior/Cognition: Alert;Cooperative;Pleasant mood Oral Cavity Assessment: Within Functional Limits Oral Care Completed by SLP: No Oral Cavity - Dentition: Dentures, top;Dentures, bottom Vision: Functional for self-feeding Self-Feeding Abilities: Able to feed self Patient Positioning: Upright in bed Baseline Vocal Quality: Normal Volitional Cough: Strong Volitional Swallow: Able to elicit    Oral/Motor/Sensory Function Overall Oral Motor/Sensory Function: Within functional limits   Ice Chips Ice chips: Not tested   Thin Liquid Thin Liquid: Within functional limits Presentation: Cup;Straw    Nectar Thick Nectar Thick Liquid: Not tested   Honey Thick Honey Thick Liquid: Not tested  Puree Puree: Within functional limits   Solid     Solid: Within functional limits      Juan Quam Laurice 11/06/2022,2:07 PM  Estill Bamberg L. Tivis Ringer, MA CCC/SLP Clinical Specialist - [place] Office number [phone]

## 2022-11-06 NOTE — Progress Notes (Signed)
Patient Heidi Dominguez verbalized difficulty swallowing and Heidi Stefanelli, RN observed swallowing difficulty with thin liquids.  Speech consult ordered.  Continue to monitor.

## 2022-11-06 NOTE — Progress Notes (Signed)
   11/06/22 1025  Spiritual Encounters  Type of Visit Initial  Care provided to: Patient  Referral source Patient request  Reason for visit Routine spiritual support  OnCall Visit No  Spiritual Framework  Presenting Themes Meaning/purpose/sources of inspiration;Caregiving needs  Interventions  Spiritual Care Interventions Made Established relationship of care and support;Compassionate presence;Encouragement;Prayer   Melven Sartorius responded to spiritual consult for prayer. PT was in high spirits and eager to talk to me.  Chap listened to PT's stories and provided a compassionate presence. After some time in conversation [place] prayed for PT.  She asked "pray that I can go on."

## 2022-11-06 NOTE — Care Management Important Message (Signed)
Important Message  Patient Details  Name: Heidi Dominguez MRN: [id number] Date of Birth: 08/22/1920   Medicare Important Message Given:  Yes     Orbie Pyo 11/06/2022, 3:22 PM

## 2022-11-06 NOTE — Progress Notes (Signed)
Referring Physician(s): Gilberto Better  Supervising Physician: Aletta Edouard  Patient Status:  [facility] - In-pt  Chief Complaint:  2 days s/p right shoulder periprosthetic fluid collection drain placement x2   Subjective:  Pt sitting upright in bed eating.  She states she is eating/drinking.  She denies fever, chills or pain.   Allergies: Augmentin [amoxicillin-pot clavulanate], Bactrim [sulfamethoxazole-trimethoprim], Calan [verapamil], Catapres [clonidine hcl], Codeine, Hydrocodone, Other, Sulfa antibiotics, Zestril [lisinopril], and Azulfidine [sulfasalazine]  Medications: Prior to Admission medications   Medication Sig Start Date End Date Taking? Authorizing Provider  acetaminophen (TYLENOL) 325 MG tablet Take 650 mg by mouth 3 (three) times daily.   Yes [provider]  albuterol (VENTOLIN HFA) 108 (90 Base) MCG/ACT inhaler Inhale 2 puffs into the lungs every 6 (six) hours as needed for wheezing or shortness of breath.   Yes [provider]  amLODipine (NORVASC) 10 MG tablet Take 10 mg by mouth daily. HOLD if SBP < 110.   Yes [provider]  aspirin EC 81 MG tablet Take 162 mg by mouth at bedtime.   Yes [provider]  budesonide (PULMICORT) 0.5 MG/2ML nebulizer solution Take 2 mLs (0.5 mg total) by nebulization in the morning and at bedtime. 10/16/22  Yes Cobb, Karie Schwalbe, NP  Cholecalciferol (VITAMIN D3) 25 MCG (1000 UT) capsule Take 1,000 Units by mouth every Monday.   Yes [provider]  Cinnamon 500 MG capsule Take 500 mg by mouth daily.   Yes [provider]  dextromethorphan-guaiFENesin (MUCINEX DM) 30-600 MG 12hr tablet Take 1 tablet by mouth 2 (two) times daily. 10/16/22  Yes Cobb, Karie Schwalbe, NP  diclofenac Sodium (VOLTAREN ARTHRITIS PAIN) 1 % GEL Apply 2 g topically 3 (three) times daily. To both shoulders.   Yes [provider]  furosemide (LASIX) 20 MG tablet Take 0.5 tablets (10 mg total) by mouth daily  for 10 days. Patient taking differently: Take 20 mg by mouth daily. 10/26/22 11/05/22 Yes Tacy Learn, PA-C  hypromellose (GENTEAL SEVERE) 0.3 % GEL ophthalmic ointment Place 1 Application into both eyes 2 (two) times daily.   Yes [provider]  insulin lispro (HUMALOG) 100 UNIT/ML injection Inject 0-12 Units into the skin 3 (three) times daily before meals. Per sliding scale: If blood sugar is less than 70, call NP/PA. If blood sugar is 70 to 200, give 0 units. If blood sugar is 201 to 250, give 2 units. If blood sugar is 251 to 300, give 4 units. If blood sugar is 301 to 350, give 6 units. If blood sugar is 351 to 400, give 8 units. If blood sugar is 401 to 450, give 10 units. If blood sugar is 451 to 600, give 12 units. Recheck in 2 hours.   Yes [provider]  ipratropium-albuterol (DUONEB) 0.5-2.5 (3) MG/3ML SOLN Take 3 mLs by nebulization 2 (two) times daily. 09/19/22  Yes [provider]  Lactobacillus (PROBIOTIC ACIDOPHILUS) CAPS Take 1 capsule by mouth at bedtime.   Yes [provider]  loratadine (CLARITIN) 10 MG tablet Take 10 mg by mouth at bedtime.   Yes [provider]  losartan (COZAAR) 50 MG tablet Take 50 mg by mouth daily. HOLD for SBP < 110.   Yes [provider]  metFORMIN (GLUCOPHAGE) 500 MG tablet Take 0.5 tablets (250 mg total) by mouth 2 (two) times daily with a meal. Patient taking differently: Take 500 mg by mouth 2 (two) times daily with a meal. 10/26/22 11/25/22  Yes Suella Broad A, PA-C  Multiple Vitamins-Minerals (PRESERVISION AREDS) CAPS Take 1 capsule by mouth daily.   Yes [provider]  OMEPRAZOLE PO Take 20 mg by mouth daily. Omeprazole 20 mg DR disintegrating tablet.   Yes [provider]  ondansetron (ZOFRAN) 4 MG tablet Take 4 mg by mouth every 8 (eight) hours as needed for nausea or vomiting.   Yes [provider]  Polyethyl Glycol-Propyl Glycol (LUBRICANT EYE DROPS) 0.4-0.3  % SOLN Place 2 drops into both eyes daily.   Yes [provider]  polyethylene glycol powder (GLYCOLAX/MIRALAX) 17 GM/SCOOP powder Take 17 g by mouth at bedtime.   Yes [provider]  polyvinyl alcohol (LIQUIFILM TEARS) 1.4 % ophthalmic solution Place 1 drop into both eyes in the morning, at noon, in the evening, and at bedtime. Following injection treatment   Yes [provider]  guaiFENesin (ROBITUSSIN) 100 MG/5ML liquid Take 10 mLs by mouth every 6 (six) hours as needed for cough or to loosen phlegm. Patient not taking: Reported on 11/04/2022 10/01/22   Barb Merino, MD  LEVEMIR FLEXPEN 100 UNIT/ML FlexPen Inject 5 Units into the skin daily. 11/02/22   [provider]     Vital Signs: BP (!) 161/64 (BP Location: Left Arm)   Pulse 78   Temp 97.6 F (36.4 C) (Oral)   Resp 18   Ht 5\' 4"  (1.626 m)   Wt 190 lb 11.2 oz (86.5 kg)   SpO2 98%   BMI 32.73 kg/m   Physical Exam Vitals reviewed.  Constitutional:      General: She is not in acute distress.    Appearance: Normal appearance. She is not ill-appearing.  HENT:     Ears:     Comments: Pt is HOH Pulmonary:     Effort: Pulmonary effort is normal. No respiratory distress.  Skin:    General: Skin is warm and dry.     Comments: Medial drain flushes/aspirates easily. Site unremarkable with sutures/statlock in place. ~ 25 cc  reddish colored OP in JP. Dressing C/D/I.  Lateral drain flushes/aspirates easily. Site unremarkable with sutures/statlock in place. ~ 25 cc reddish OP in JP. Dressing C/D/I.    Neurological:     Mental Status: She is alert.  Psychiatric:        Mood and Affect: Mood normal.        Behavior: Behavior normal.        Thought Content: Thought content normal.        Judgment: Judgment normal.     Imaging: DG Abd 1 View  Result Date: 11/05/2022 CLINICAL DATA:  Constipation for several days EXAM: ABDOMEN - 1 VIEW COMPARISON:  09/16/2018 FINDINGS: Scattered large and small  bowel gas is noted. Retained fecal material is noted throughout the colon consistent with a moderate degree of constipation. No obstructive changes are seen. No free air is noted. Changes of prior vertebral augmentation are seen and stable. Degenerative changes of lumbar spine are noted. IMPRESSION: Changes consistent with moderate colonic constipation. Electronically Signed   By: Inez Catalina M.D.   On: 11/05/2022 20:27   IR US Guide Bx Asp/Drain  Result Date: 11/04/2022 INDICATION: 89 year old female with right shoulder periprosthetic fluid collections that are suggestive for abscesses. EXAM: 1. Ultrasound-guided placement of drainage catheter in lateral right shoulder abscess collection 2. Ultrasound-guided placement of drainage catheter in the anterior right shoulder abscess collection MEDICATIONS: 1% lidocaine for local anesthetic ANESTHESIA/SEDATION: None COMPLICATIONS: None immediate. PROCEDURE: Informed written consent was  obtained from the patient's daughter after a thorough discussion of the procedural risks, benefits and alternatives. All questions were addressed. Maximal Sterile Barrier Technique was utilized including caps, mask, sterile gowns, sterile gloves, sterile drape, hand hygiene and skin antiseptic. A timeout was performed prior to the initiation of the procedure. The right shoulder was prepped and draped in sterile fashion. Ultrasound demonstrated a large fluid collection along the lateral aspect of the right shoulder. The skin was anesthetized with 1% lidocaine. Small incision was made. Using ultrasound guidance, a Yueh catheter was directed into the fluid collection and pink purulent fluid was aspirated. Superstiff Amplatz wire was placed and the tract was dilated to accommodate a 10 [place] multipurpose drain. Approximately 130 mL of pink purulent fluid was removed. This collection was decompressed. Ultrasound of right shoulder demonstrated persistent fluid collections in the anterior  right shoulder region. The largest right anterior shoulder fluid collection was targeted. The skin was anesthetized with 1% lidocaine. Small incision was made. Using ultrasound guidance, a Yueh catheter was directed into this anterior fluid collection and yellow purulent fluid was aspirated. Superstiff Amplatz wire was placed and an 8 [employer] drain was advanced over the wire. Approximately 40 mL of fluid was removed from this collection. Ultrasound was also used to evaluate the remainder of the right shoulder. Both drains were sutured to skin with Prolene suture. Both drains were attached to suction bulbs. Fluid sample from each drain was sent for culture. FINDINGS: There was a very large fluid collection in the lateral aspect of the right shoulder and 130 mL of pink purulent fluid was removed from this collection. This collection was decompressed at the end of the procedure. There were small fluid collections along the anterior aspect of the right shoulder including a dominant collection. An 8 French drain was placed in the dominant anterior right fluid collection and this appeared to decompress the other right anterior fluid collections. 40 mL of yellow purulent fluid was removed from the right anterior shoulder fluid collection. IMPRESSION: 1. Ultrasound-guided placement of a drainage catheter in the right lateral shoulder abscess collection. 2. Ultrasound-guided placement of a drainage catheter in the right anterior shoulder abscess. Electronically Signed   By: Markus Daft M.D.   On: 11/04/2022 15:25   IR US Guide Bx Asp/Drain  Result Date: 11/04/2022 INDICATION: 89 year old female with right shoulder periprosthetic fluid collections that are suggestive for abscesses. EXAM: 1. Ultrasound-guided placement of drainage catheter in lateral right shoulder abscess collection 2. Ultrasound-guided placement of drainage catheter in the anterior right shoulder abscess collection MEDICATIONS: 1% lidocaine  for local anesthetic ANESTHESIA/SEDATION: None COMPLICATIONS: None immediate. PROCEDURE: Informed written consent was obtained from the patient's daughter after a thorough discussion of the procedural risks, benefits and alternatives. All questions were addressed. Maximal Sterile Barrier Technique was utilized including caps, mask, sterile gowns, sterile gloves, sterile drape, hand hygiene and skin antiseptic. A timeout was performed prior to the initiation of the procedure. The right shoulder was prepped and draped in sterile fashion. Ultrasound demonstrated a large fluid collection along the lateral aspect of the right shoulder. The skin was anesthetized with 1% lidocaine. Small incision was made. Using ultrasound guidance, a Yueh catheter was directed into the fluid collection and pink purulent fluid was aspirated. Superstiff Amplatz wire was placed and the tract was dilated to accommodate a 10 [place] multipurpose drain. Approximately 130 mL of pink purulent fluid was removed. This collection was decompressed. Ultrasound of right shoulder demonstrated persistent fluid collections in the  anterior right shoulder region. The largest right anterior shoulder fluid collection was targeted. The skin was anesthetized with 1% lidocaine. Small incision was made. Using ultrasound guidance, a Yueh catheter was directed into this anterior fluid collection and yellow purulent fluid was aspirated. Superstiff Amplatz wire was placed and an 8 [employer] drain was advanced over the wire. Approximately 40 mL of fluid was removed from this collection. Ultrasound was also used to evaluate the remainder of the right shoulder. Both drains were sutured to skin with Prolene suture. Both drains were attached to suction bulbs. Fluid sample from each drain was sent for culture. FINDINGS: There was a very large fluid collection in the lateral aspect of the right shoulder and 130 mL of pink purulent fluid was removed from this  collection. This collection was decompressed at the end of the procedure. There were small fluid collections along the anterior aspect of the right shoulder including a dominant collection. An 8 French drain was placed in the dominant anterior right fluid collection and this appeared to decompress the other right anterior fluid collections. 40 mL of yellow purulent fluid was removed from the right anterior shoulder fluid collection. IMPRESSION: 1. Ultrasound-guided placement of a drainage catheter in the right lateral shoulder abscess collection. 2. Ultrasound-guided placement of a drainage catheter in the right anterior shoulder abscess. Electronically Signed   By: Markus Daft M.D.   On: 11/04/2022 15:25   VAS [place] LOWER EXTREMITY VENOUS (DVT) (7a-7p)  Result Date: 11/04/2022  Lower Venous DVT Study Patient Name:  Heidi Dominguez  Date of Exam:   11/03/2022 Medical Rec #: [id number]        Accession #:    [id number] Date of Birth: 1920/11/05        Patient Gender: F Patient Age:   65 years Exam Location:  [facility] Procedure:      VAS [place] LOWER EXTREMITY VENOUS (DVT) Referring Phys: ADAM CURATOLO --------------------------------------------------------------------------------  Indications: Pain, Edema, and Chronic swelling.  Risk Factors: Surgery Lower ext bypass graft, left leg stenting. Comparison Study: 03/16/21 - Negative LE venous Performing Technologist: Velva Harman Sturdivant RDMS, RVT  Examination Guidelines: A complete evaluation includes B-mode imaging, spectral Doppler, color Doppler, and power Doppler as needed of all accessible portions of each vessel. Bilateral testing is considered an integral part of a complete examination. Limited examinations for reoccurring indications may be performed as noted. The reflux portion of the exam is performed with the patient in reverse Trendelenburg.  +---------+---------------+---------+-----------+----------+-------------------+ RIGHT     CompressibilityPhasicitySpontaneityPropertiesThrombus Aging      +---------+---------------+---------+-----------+----------+-------------------+ CFV      Full           Yes      Yes                                      +---------+---------------+---------+-----------+----------+-------------------+ SFJ      Full                                                             +---------+---------------+---------+-----------+----------+-------------------+ FV Prox  Full                                                             +---------+---------------+---------+-----------+----------+-------------------+  FV Mid   Full                                                             +---------+---------------+---------+-----------+----------+-------------------+ FV DistalFull                                                             +---------+---------------+---------+-----------+----------+-------------------+ PFV      Full                                                             +---------+---------------+---------+-----------+----------+-------------------+ POP      Full           Yes      Yes                                      +---------+---------------+---------+-----------+----------+-------------------+ PTV      Full                                                             +---------+---------------+---------+-----------+----------+-------------------+ PERO                                                  Not well visualized +---------+---------------+---------+-----------+----------+-------------------+   Right Technical Findings: Bilateral calf veins were not well visualized. Cannot exclude chronic DVT in the calf veins.  +---------+---------------+---------+-----------+----------+-------------------+ LEFT     CompressibilityPhasicitySpontaneityPropertiesThrombus Aging       +---------+---------------+---------+-----------+----------+-------------------+ CFV      Full           Yes      Yes                                      +---------+---------------+---------+-----------+----------+-------------------+ SFJ      Full                                                             +---------+---------------+---------+-----------+----------+-------------------+ FV Prox  Full                                                             +---------+---------------+---------+-----------+----------+-------------------+  FV Mid   Full                                                             +---------+---------------+---------+-----------+----------+-------------------+ FV DistalFull                                                             +---------+---------------+---------+-----------+----------+-------------------+ PFV      Full                                                             +---------+---------------+---------+-----------+----------+-------------------+ POP      Full           Yes      Yes                                      +---------+---------------+---------+-----------+----------+-------------------+ PTV      Full                                                             +---------+---------------+---------+-----------+----------+-------------------+ PERO                                                  Not well visualized +---------+---------------+---------+-----------+----------+-------------------+     Summary: BILATERAL: - No evidence of acute deep vein thrombosis seen in the lower extremities, bilaterally. -No evidence of popliteal cyst, bilaterally. RIGHT: -   *See table(s) above for measurements and observations. Electronically signed by Orlie Pollen on 11/04/2022 at 10:55:07 AM.    Final    CT SHOULDER RIGHT W CONTRAST  Result Date: 11/03/2022 CLINICAL DATA:  Septic arthritis suspected,  shoulder, xray done. Leukocytosis and elevated serum inflammatory markers. EXAM: CT OF THE UPPER RIGHT EXTREMITY WITH CONTRAST TECHNIQUE: Multidetector CT imaging of the upper right extremity was performed according to the standard protocol following intravenous contrast administration. RADIATION DOSE REDUCTION: This exam was performed according to the departmental dose-optimization program which includes automated exposure control, adjustment of the mA and/or kV according to patient size and/or use of iterative reconstruction technique. CONTRAST:  40mL OMNIPAQUE IOHEXOL 350 MG/ML SOLN COMPARISON:  X-ray 11/03/2022, CT 05/27/2021 FINDINGS: Bones/Joint/Cartilage Long stem right humeral prosthesis. The inferior tip of the prosthesis is not included within the field of view. Chronic superomedial migration of the humeral head component with extensive osseous remodeling of the scapula and distal clavicle. Chronic osteolysis involving the proximal humeral diaphysis, not significantly progressed. No acute fracture. Os acromiale. Ligaments Suboptimally assessed by CT. Muscles and Tendons Findings  of chronic rotator cuff tears with chronic muscle atrophy. Soft tissues Large peripherally enhancing periprosthetic fluid collection surrounding the proximal right humerus measuring approximately 14 x 5 cm in transaxial dimension. Loculated component at the anterior shoulder extends approximately 10 cm in craniocaudal dimension. Collection closely approximates the skin surface at the anterior aspect of the shoulder. Loculated component at the posterior aspect of the shoulder measures approximately 8 cm in craniocaudal dimension. Mildly prominent right axillary lymph nodes are likely reactive. Small layering right-sided pleural effusion. Multinodular goiter, similar to the previous study. In the setting of significant comorbidities or limited life expectancy, no follow-up recommended (ref: J Am Coll Radiol. 2015 Feb;12(2): 143-50).  IMPRESSION: 1. Large peripherally-enhancing periprosthetic fluid collection surrounding the proximal right humerus measuring up to 14 cm in maximal dimension. Findings are most concerning for infected fluid collection/abscess given the patient's history. Aspiration and joint fluid analysis is recommended. 2. Chronic superomedial migration of the humeral head component with extensive osseous remodeling of the scapula and distal clavicle. Chronic osteolysis involving the proximal humeral diaphysis, not significantly progressed. 3. Small layering right-sided pleural effusion. Electronically Signed   By: Davina Poke D.O.   On: 11/03/2022 15:09   DG Chest 2 View  Result Date: 11/03/2022 CLINICAL DATA:  Chest pain. EXAM: CHEST - 2 VIEW COMPARISON:  October 25, 2022. FINDINGS: Stable cardiomegaly. Large hiatal hernia is noted. Minimal right basilar subsegmental atelectasis is noted. Status post bilateral shoulder arthroplasties. IMPRESSION: Minimal right basilar subsegmental atelectasis. Large hiatal hernia. Electronically Signed   By: Marijo Conception M.D.   On: 11/03/2022 12:06   DG Shoulder Right  Result Date: 11/03/2022 CLINICAL DATA:  Pain EXAM: RIGHT SHOULDER - 3 VIEW COMPARISON:  None Available. FINDINGS: There is a right shoulder prosthesis. There appears to be grossly anatomic alignment. There is absence of the acromial humeral distance indicating the presence of rotator cuff defect. No acute fracture, dislocation or subluxation identified. IMPRESSION: Status post right shoulder arthroplasty. No acute osseous abnormalities are seen. Electronically Signed   By: Sammie Bench M.D.   On: 11/03/2022 12:03    Labs:  CBC: Recent Labs    10/25/22 2234 10/25/22 2242 10/25/22 2329 11/03/22 1136 11/05/22 0443 11/06/22 0440  WBC 13.1*  --   --  15.5* 12.8* 12.6*  HGB 11.4*   < > 12.6 11.2* 10.0* 10.0*  HCT 35.7*   < > 37.0 34.3* 31.0* 31.7*  PLT 228  --   --  370 391 375   < > = values in this  interval not displayed.    COAGS: No results for input(s): "INR", "APTT" in the last 8760 hours.  BMP: Recent Labs    11/03/22 1136 11/04/22 0135 11/05/22 0443 11/06/22 0440  NA 122* 125* 127* 124*  K 5.1 5.1 4.9 4.6  CL 84* 86* 90* 91*  CO2 27 31 29 26   GLUCOSE 307* 151* 121* 160*  BUN 17 15 18 13   CALCIUM 8.4* 8.7* 8.3* 8.2*  CREATININE 0.75 0.79 0.79 0.60  GFRNONAA >60 >60 >60 >60    LIVER FUNCTION TESTS: Recent Labs    10/25/22 2234 11/03/22 1136 11/05/22 0443 11/06/22 0440  BILITOT QUANTITY NOT SUFFICIENT, UNABLE TO PERFORM TEST 0.5 0.8 0.6  AST 14* 23 13* 14*  ALT 12 11 11 12   ALKPHOS 158* 87 65 65  PROT 6.3* 6.1* 5.8* 5.8*  ALBUMIN 2.1* 1.9* 1.7* 1.7*    Assessment and Plan:  89 yo female 2 days s/p right shoulder fluid  collection drain placement x 2.  WBC 12.6 (12.8) Afebrile, VSS  Preliminary Cx: FEW ESCHERICHIA COLI  CONTINUING TO HOLD  PER DR VU HOLD FOR 14 DAYS FOR P ACNES   Plan:  Please assess drain for possible removal prior to d/c.  Continue TID flushes with 5 cc NS. Record output Q shift. Dressing changes QD or PRN if soiled.  Call IR APP or on call IR MD if difficulty flushing or sudden change in drain output.  Repeat imaging/possible drain injection once output < 10 mL/QD (excluding flush material). Consideration for drain removal if output is < 10 mL/QD (excluding flush material), pending discussion with the providing surgical service.   Discharge planning: Please contact IR APP or on call IR MD prior to patient d/c to ensure appropriate follow up plans are in place. Typically patient will follow up with IR clinic 10-14 days post d/c for repeat imaging/possible drain injection. IR scheduler will contact patient with date/time of appointment. Patient will need to flush drain QD with 5 cc NS, record output QD, dressing changes every 2-3 days or earlier if soiled.    IR will continue to follow - please call with questions or  concerns.   Electronically Signed: Tyson Alias, NP 11/06/2022, 1:55 PM   I spent a total of 15 Minutes at the the patient's bedside AND on the patient's hospital floor or unit, greater than 50% of which was counseling/coordinating care for right shoulder periprosthetic fluid collection.

## 2022-11-06 NOTE — Progress Notes (Signed)
  Progress Note   Patient: Heidi Dominguez [id number] DOB: May 19, 1921 DOA: 11/03/2022     3 DOS: the patient was seen and examined on 11/06/2022   Brief hospital course: 89 y/o with h/o bilateral shoulder replacement, previous right septic shoulder requiring antibiotic beads presents due to increased swelling of the right shoulder and pain with movement. She has not had a fever, rigors. She has a residual cough from a recent bout of RSV but otherwise has been doing well. Due to her symptoms she was transported to [facility] for evaluation.   Assessment and Plan: * Septic arthritis of shoulder, right (HCC) H/o bilateral shoulder replacement and prior infection right prosthetic shoulder.  -Presented with increased swelling and pain, leukocytosis.  -CT shoulder confirms large collection of fluid -Ortho following. Pt now s/p drain placement in R shoulder -cultures pending -Appreciate input by ID.  Patient has been continued on vancomycin and Rocephin -ID recommendations to discontinue vancomycin, continue Rocephin follow-up, cultures that are being held for 14 days   Hyponatremia Patient with prior problems with low sodium. Possible etiology diuretics: she was taking HCTZ and Lasix. She is asymptomatic -had stopped HCTZ and would not resume again in the future -Sodium lower today. -Urine sodium and urine osmole's pending, thus far serum osmole's are low -Patient does appear euvolemic to slightly hypervolemic -Recommend 1200cc volume restriction for now -Continue Lasix   Chronic diastolic CHF (congestive heart failure) ([place]) Last Echo 04/09/2015 with EF 60-65%, Grade III DD. She has been managed with HCTZ and low dose lasix. -D/c HCTZ and would not resume again in the future -continue Lasix 40 mg daily -cont losartan, ASA   Diabetes mellitus type 2, controlled ([place]) Last A1C 10/02/22 7.5%.  -metformin was continued at presentation -Cont SSI as needed  Essential hypertension BP remains  stable -Cont lasix, losartan, norvasc  Nausea vomiting -Recently had nausea and vomiting -xray ordered and reviewed, findings notable for constipation -Continue cathartics, increased MiraLAX to twice daily    Subjective: Reports feeling better today  Physical Exam: Vitals:   11/05/22 1558 11/05/22 1936 11/06/22 0415 11/06/22 0719  BP: (!) 123/55 (!) 125/109 (!) 152/64 (!) 161/64  Pulse: 74 78 74 78  Resp: 18 15 16 18   Temp: (!) 97.4 F (36.3 C) 98.1 F (36.7 C) 97.9 F (36.6 C) 97.6 F (36.4 C)  TempSrc: Oral Oral  Oral  SpO2: 93% 92% 95% 98%  Weight:      Height:       General exam: Awake, laying in bed, in nad Respiratory system: Normal respiratory effort, no wheezing Cardiovascular system: regular rate, s1, s2 Gastrointestinal system: Soft, nondistended, positive BS Central nervous system: CN2-12 grossly intact, strength intact Extremities: Perfused, no clubbing Skin: Normal skin turgor, no notable skin lesions seen Psychiatry: Mood normal // no visual hallucinations   Data Reviewed:  Labs reviewed: Na 1124, K 4.6, Cr 0.60  Family Communication: Pt in room, family not at bedside  Disposition: Status is: Inpatient Remains inpatient appropriate because: Severity of illness  Planned Discharge Destination: Home    Author: Marylu Lund, MD 11/06/2022 2:03 PM  For on call review [email or web address].

## 2022-11-06 NOTE — Progress Notes (Signed)
Initial Nutrition Assessment  DOCUMENTATION CODES:   Obesity unspecified  INTERVENTION:  - Add Glucerna Shake po BID, each supplement provides 220 kcal and 10 grams of protein.   NUTRITION DIAGNOSIS:   Inadequate oral intake related to poor appetite as evidenced by per patient/family report.  GOAL:   Patient will meet greater than or equal to 90% of their needs  MONITOR:   PO intake  REASON FOR ASSESSMENT:   Consult Assessment of nutrition requirement/status  ASSESSMENT:   89 y.o. female admits related to swelling of the right shoulder and pain when moving. PMH includes: pancreatitis, CHF, CKD, GERD. Pt is currently receiving medical management related to septic arthritis of right shoulder.  Meds reviewed: risaquad, lasix, sliding scale insulin, cozaar, miralax, senokot. Labs reviewed: Na low.   Pt is oriented x3. Pt needed to go to bedside commode at time of assessment. Pt was able to report that she ate well today. But when asked how she was eating prior to admission, the pt seemed slightly confused. No significant wt loss per record. RD will add Glucerna BID for now. Will continue to monitor PO intakes.   NUTRITION - FOCUSED PHYSICAL EXAM:  WDL - no wasting noted.   Diet Order:   Diet Order             Diet Carb Modified Fluid consistency: Thin; Room service appropriate? Yes with Assist; Fluid restriction: 1200 mL Fluid  Diet effective now                   EDUCATION NEEDS:   Not appropriate for education at this time  Skin:  Skin Assessment: Reviewed RN Assessment  Last BM:  3/25 - type 1  Height:   Ht Readings from Last 1 Encounters:  11/03/22 5\' 4"  (1.626 m)    Weight:   Wt Readings from Last 1 Encounters:  11/03/22 86.5 kg    Ideal Body Weight:     BMI:  Body mass index is 32.73 kg/m.  Estimated Nutritional Needs:   Kcal:  1730-2160 kcals  Protein:  85-105 gm  Fluid:  >/= 1.7 L  Thalia Bloodgood, RD, LDN, CNSC.

## 2022-11-06 NOTE — Progress Notes (Addendum)
[facility] Disease  Date of Admission:  11/03/2022   Total days of inpatient antibiotics Righ shoulder PJI  Principal Problem:   Septic arthritis of shoulder, right (HCC) Active Problems:   Essential hypertension   Diabetes mellitus type 2, controlled (HCC)   Chronic diastolic CHF (congestive heart failure) (HCC)   End of life care   Hyponatremia          Assessment: 89 YF with history of bilateral shoulder arthroplasty, history of right shoulder septic arthroplasty admitted with: # Recurrent right shoulder PJI with abscess SP IR drain placement with Cx+ Ecoli -CT of right shoulder on 3/22 showed large peripheral enhancing periprosthetic fluid collection around proximal right humerus concerning for abscess. - IR aspirated right shoulder and noted 40 ml purulent fluid, samples sent Cx+ Ecoli -On Vanc and ceftriaxone, Cx held x 14 days.  -Previous right shoulder PJI due to E. coli per documentation previously managed on suppressive Cipro x 2.5 years until  05/04/2021.  Last surgery was I&D and ABX bead placement on 11/08/2018.  Unable to revisedue to poor bone stock and presence of longstem with cables. Recommendations: -D/C vanc -Continue Ceftriaxone. Per Lab Ecoli Cefazolin MIC <=4( Sens) hopefully we will able to offer a PO regimen. Will need indefinite suppression given hardware in place.    Microbiology:   Antibiotics: Cefepime 3/22-23 Ceftriaxone 3/24- Vancomycin 3/22- Cultures: Blood 3/22 NG 3/23 NG  SUBJECTIVE: Resting in bed. No new complaints.  Interval: Afebrile overnight. Wbc 12.6k  Review of Systems: Review of Systems  All other systems reviewed and are negative.    Scheduled Meds:  acidophilus  1 capsule Oral QHS   amLODipine  10 mg Oral Daily   artificial tears   Both Eyes BID   aspirin EC  162 mg Oral QHS   budesonide  0.5 mg Nebulization BH-qamhs   dextromethorphan-guaiFENesin  1 tablet Oral BID   diclofenac Sodium  2  g Topical TID   furosemide  40 mg Oral Daily   heparin  5,000 Units Subcutaneous Q8H   insulin aspart  0-20 Units Subcutaneous TID WC   loratadine  10 mg Oral QHS   losartan  50 mg Oral Daily   pantoprazole  40 mg Oral Daily   polyethylene glycol  17 g Oral QHS   polyvinyl alcohol  2 drop Both Eyes Daily   senna  1 tablet Oral BID   sodium chloride flush  5 mL Intracatheter Q8H   Continuous Infusions:  sodium chloride 50 mL/hr at 11/05/22 2000   cefTRIAXone (ROCEPHIN)  IV 2 g (11/05/22 1305)   vancomycin 1,750 mg (11/05/22 1012)   PRN Meds:.acetaminophen **OR** acetaminophen, albuterol, melatonin, morphine injection, naLOXone (NARCAN)  injection, ondansetron (ZOFRAN) IV Allergies  Allergen Reactions   Augmentin [Amoxicillin-Pot Clavulanate] Diarrhea   Bactrim [Sulfamethoxazole-Trimethoprim] Diarrhea and Itching   Calan [Verapamil] Diarrhea   Catapres [Clonidine Hcl] Other (See Comments)    Unknown reaction Not documented on MAR    Codeine Other (See Comments)    "Makes me out of my head, loopy"   Hydrocodone Other (See Comments)    "makes me go out of my head, loopy"   Other Other (See Comments)    Any type of narcotics Feels "loopy"    Sulfa Antibiotics Itching   Zestril [Lisinopril] Cough   Azulfidine [Sulfasalazine] Itching    OBJECTIVE: Vitals:   11/05/22 0855 11/05/22 1558 11/05/22 1936 11/06/22 0415  BP:  (!) 123/55 Marland Kitchen)  125/109 (!) 152/64  Pulse:  74 78 74  Resp:  18 15 16   Temp:  (!) 97.4 F (36.3 C) 98.1 F (36.7 C) 97.9 F (36.6 C)  TempSrc:  Oral Oral   SpO2: 94% 93% 92% 95%  Weight:      Height:       Body mass index is 32.73 kg/m.  Physical Exam Constitutional:      Appearance: Normal appearance.  HENT:     Head: Normocephalic and atraumatic.     Right Ear: Tympanic membrane normal.     Left Ear: Tympanic membrane normal.     Nose: Nose normal.     Mouth/Throat:     Mouth: Mucous membranes are moist.  Eyes:     Extraocular Movements:  Extraocular movements intact.     Conjunctiva/sclera: Conjunctivae normal.     Pupils: Pupils are equal, round, and reactive to light.  Cardiovascular:     Rate and Rhythm: Normal rate and regular rhythm.     Heart sounds: No murmur heard.    No friction rub. No gallop.  Pulmonary:     Effort: Pulmonary effort is normal.     Breath sounds: Normal breath sounds.  Abdominal:     General: Abdomen is flat.     Palpations: Abdomen is soft.  Musculoskeletal:     Comments: Right shoulder drain  Skin:    General: Skin is warm and dry.  Neurological:     General: No focal deficit present.     Mental Status: She is alert and oriented to person, place, and time.  Psychiatric:        Mood and Affect: Mood normal.       Lab Results Lab Results  Component Value Date   WBC 12.6 (H) 11/06/2022   HGB 10.0 (L) 11/06/2022   HCT 31.7 (L) 11/06/2022   MCV 85.7 11/06/2022   PLT 375 11/06/2022    Lab Results  Component Value Date   CREATININE 0.60 11/06/2022   BUN 13 11/06/2022   NA 124 (L) 11/06/2022   K 4.6 11/06/2022   CL 91 (L) 11/06/2022   CO2 26 11/06/2022    Lab Results  Component Value Date   ALT 12 11/06/2022   AST 14 (L) 11/06/2022   ALKPHOS 65 11/06/2022   BILITOT 0.6 11/06/2022        [name], [facility] 11/06/2022, 5:59 AM

## 2022-11-06 NOTE — TOC Initial Note (Signed)
Transition of Care [facility]) - Initial/Assessment Note    Patient Details  Name: Heidi Dominguez MRN: [id number] Date of Birth: 1921-02-06  Transition of Care [facility]) CM/SW Contact:    Joanne Chars, LCSW Phone Number: 11/06/2022, 3:31 PM  Clinical Narrative:      CSW met with pt and daughter [name] regarding DC plan.  Pt did not participate in discussion, all information from daughter.  Pt did respond when daughter asked her questions.  Daughter confirms pt is long term at [facility].  Daughter is hopeful that pt can make some progress with PT so that she can stand up in the future.  Daughter planning on pt return to [facility] either way, STR or LTC.    CSW spoke with [facility] who confirmed they will take pt at DC.             Expected Discharge Plan: Skilled Nursing Facility Barriers to Discharge: Continued Medical Work up   Patient Goals and CMS Choice     Choice offered to / list presented to : Adult Children (daughter Corporate treasurer)      Expected Discharge Plan and Services In-house Referral: Clinical Social Work   Post Acute Care Choice: [facility] Living arrangements for the past 2 months: [facility])                                      Prior Living Arrangements/Services Living arrangements for the past 2 months: [place] [employer], contracting) Lives with:: Facility Resident Patient language and need for interpreter reviewed:: Yes        Need for Family Participation in Patient Care: Yes (Comment) Care giver support system in place?: Yes (comment) Current home services: Other (comment) (na) Criminal Activity/Legal Involvement Pertinent to Current Situation/Hospitalization: No - Comment as needed  Activities of Daily Living Home Assistive Devices/Equipment: Dentures (specify type), Eyeglasses, Wheelchair, Other (Comment) (use sit to stand lift per daughter) ADL Screening (condition at time of admission) Patient's cognitive  ability adequate to safely complete daily activities?: Yes Is the patient deaf or have difficulty hearing?: Yes Does the patient have difficulty seeing, even when wearing glasses/contacts?: No Does the patient have difficulty concentrating, remembering, or making decisions?: No Patient able to express need for assistance with ADLs?: Yes Does the patient have difficulty dressing or bathing?: Yes Independently performs ADLs?: No Communication: Needs assistance Is this a change from baseline?: Pre-admission baseline Dressing (OT): Needs assistance Is this a change from baseline?: Pre-admission baseline Grooming: Needs assistance Is this a change from baseline?: Pre-admission baseline Feeding: Needs assistance Is this a change from baseline?: Pre-admission baseline Bathing: Needs assistance Is this a change from baseline?: Pre-admission baseline Toileting: Needs assistance Is this a change from baseline?: Pre-admission baseline In/Out Bed: Dependent Is this a change from baseline?: Pre-admission baseline Walks in Home: Dependent Is this a change from baseline?: Pre-admission baseline Does the patient have difficulty walking or climbing stairs?: Yes Weakness of Legs: Both Weakness of Arms/Hands: Both  Permission Sought/Granted                  Emotional Assessment Appearance:: Appears stated age Attitude/Demeanor/Rapport: Unable to Assess Affect (typically observed): Unable to Assess Orientation: : Oriented to Self, Oriented to Place, Oriented to Situation      Admission diagnosis:  Hyponatremia [E87.1] Pain in joint of right shoulder [M25.511] Septic arthritis of shoulder, right (HCC) [M00.9] Sepsis,  due to unspecified organism, unspecified whether acute organ dysfunction present [facility]) [A41.9] Patient Active Problem List   Diagnosis Date Noted   End of life care 11/03/2022   Hyponatremia 11/03/2022   Reactive airway disease 10/16/2022   Acute respiratory failure with  hypoxia ([place]) 09/28/2022   Bilateral lower extremity edema 09/28/2022   Bronchitis 09/27/2022   Bruising 03/25/2021   COVID-19 virus infection    Chest pain 03/14/2021   Costochondritis    Osteomyelitis ([place]) 01/12/2021   Peripheral artery disease ([place]) 12/22/2020   Diabetic foot ulcer ([place]) 09/02/2020   Diabetic neuropathy ([place]) 09/02/2020   Gait abnormality 09/02/2020   Diabetic retinopathy associated with type 2 diabetes mellitus ([place]) 09/02/2020   Hardening of the aorta (main artery of the heart) ([place]) 09/02/2020   Pure hypercholesterolemia 09/02/2020   Thrombocytopenia ([place]) 09/02/2020   Gastroesophageal reflux disease 10/22/2019   Unspecified chronic bronchitis ([place]) 09/02/2019   Infection or inflammatory reaction due to internal joint prosthesis ([place]) 02/20/2019   Rhinitis, chronic 01/07/2019   Septic arthritis of shoulder, right ([place]) 11/08/2018   History of hemiarthroplasty of right shoulder 09/25/2018   Acute pancreatitis 08/28/2018   Acute recurrent pancreatitis    Abdominal pain 08/23/2018   Elevated LFTs 07/04/2018   CKD (chronic kidney disease) stage 3, GFR 30-59 ml/min (HCC) 07/04/2018   Upper airway cough syndrome 03/13/2018   Sensorineural hearing loss (SNHL), bilateral 12/04/2016   Post-nasal drainage 03/14/2016   Presbycusis of both ears 03/14/2016   Bilateral hearing loss 10/06/2015   Bilateral impacted cerumen 10/06/2015   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   Subjective tinnitus of both ears 10/06/2015   Choledocholithiasis with chronic cholecystitis, nonobstructing 04/11/2015   Pre-operative cardiovascular examination, high risk surgery    Epigastric abdominal pain 04/08/2015   Acute gallstone pancreatitis s/p lap chole w IOC 04/11/2015 04/08/2015   Diabetes mellitus type 2, controlled ([place]) 04/08/2015   Gallstone pancreatitis 04/08/2015   Mitral regurgitation 04/08/2015   Chronic diastolic CHF (congestive heart failure) ([place])  04/08/2015   DOE (dyspnea on exertion) 03/31/2015   Ischemic colitis ([place]) 08/09/2012   Colitis 08/07/2012   Rectal bleeding 08/07/2012   Weakness 09/24/2011   Bradycardia 09/24/2011   Acute lower UTI 09/24/2011   Hematochezia 09/24/2011   Essential hypertension 09/24/2011   PCP:  Lajean Manes, MD Pharmacy:   [facility] [address], [place] [address] Phone: [phone] Fax: [facility] [phone] - [place] - [address] AT [place] [address] [place] [address] Phone: [phone] Fax: [phone]     Social Determinants of Health (SDOH) Social History: SDOH Screenings   Food Insecurity: No Food Insecurity (11/03/2022)  Housing: Low Risk  (11/03/2022)  Transportation Needs: No Transportation Needs (11/03/2022)  Utilities: Not At Risk (11/03/2022)  Depression (PHQ2-9): Low Risk  (02/09/2021)  Tobacco Use: Low Risk  (11/04/2022)   SDOH Interventions:     Readmission Risk Interventions     No data to display

## 2022-11-06 NOTE — Progress Notes (Signed)
Pharmacy Antibiotic Note  Heidi Dominguez is a 89 y.o. female admitted on 11/03/2022 with  shoulder infection .  Pharmacy has been consulted for cefepime and vancomycin dosing.  Plan: Continue Vancomycin 1750 mg IV Q48H (eAUC 488, Vd 0.72) Continue Ceftriaxone 2 g IV Q24H Monitor renal function Obtain vancomycin levels as indicated  Temp (24hrs), Avg:97.8 F (36.6 C), Min:97.4 F (36.3 C), Max:98.1 F (36.7 C)  Recent Labs  Lab 11/03/22 1136 11/03/22 1547 11/04/22 0135 11/05/22 0443 11/06/22 0440  WBC 15.5*  --   --  12.8* 12.6*  CREATININE 0.75  --  0.79 0.79 0.60  LATICACIDVEN 3.3* 1.4  --   --   --      Estimated Creatinine Clearance: 40.8 mL/min (by C-G formula based on SCr of 0.6 mg/dL).    Allergies  Allergen Reactions   Augmentin [Amoxicillin-Pot Clavulanate] Diarrhea   Bactrim [Sulfamethoxazole-Trimethoprim] Diarrhea and Itching   Calan [Verapamil] Diarrhea   Catapres [Clonidine Hcl] Other (See Comments)    Unknown reaction Not documented on MAR    Codeine Other (See Comments)    "Makes me out of my head, loopy"   Hydrocodone Other (See Comments)    "makes me go out of my head, loopy"   Other Other (See Comments)    Any type of narcotics Feels "loopy"    Sulfa Antibiotics Itching   Zestril [Lisinopril] Cough   Azulfidine [Sulfasalazine] Itching    Antimicrobials this admission: Cefepime 3/22 >> 3/24 Ceftriaxone 3/24>> Vancomycin 3/22 >>   Dose adjustments this admission:  Microbiology results: 3/23 Abscess: GNR 3/23 Blood: NGTD  Delores Edelstein A. Levada Dy, PharmD, BCPS, FNKF Clinical Pharmacist [place] Please utilize Amion for appropriate phone number to reach the unit pharmacist ([place])  11/06/2022 8:04 AM

## 2022-11-07 DIAGNOSIS — E871 Hypo-osmolality and hyponatremia: Secondary | ICD-10-CM | POA: Diagnosis not present

## 2022-11-07 DIAGNOSIS — M00811 Arthritis due to other bacteria, right shoulder: Secondary | ICD-10-CM | POA: Diagnosis not present

## 2022-11-07 DIAGNOSIS — M25511 Pain in right shoulder: Secondary | ICD-10-CM | POA: Diagnosis not present

## 2022-11-07 LAB — COMPREHENSIVE METABOLIC PANEL
ALT: 11 U/L (ref 0–44)
AST: 14 U/L — ABNORMAL LOW (ref 15–41)
Albumin: 1.7 g/dL — ABNORMAL LOW (ref 3.5–5.0)
Alkaline Phosphatase: 62 U/L (ref 38–126)
Anion gap: 9 (ref 5–15)
BUN: 9 mg/dL (ref 8–23)
CO2: 27 mmol/L (ref 22–32)
Calcium: 8.4 mg/dL — ABNORMAL LOW (ref 8.9–10.3)
Chloride: 88 mmol/L — ABNORMAL LOW (ref 98–111)
Creatinine, Ser: 0.67 mg/dL (ref 0.44–1.00)
GFR, Estimated: >60 mL/min (ref >60)
Glucose, Bld: 138 mg/dL — ABNORMAL HIGH (ref 70–99)
Potassium: 4.6 mmol/L (ref 3.5–5.1)
Sodium: 124 mmol/L — ABNORMAL LOW (ref 135–145)
Total Bilirubin: 0.5 mg/dL (ref 0.3–1.2)
Total Protein: 5.7 g/dL — ABNORMAL LOW (ref 6.5–8.1)

## 2022-11-07 LAB — CBC
HCT: 31.6 % — ABNORMAL LOW (ref 36.0–46.0)
Hemoglobin: 10.2 g/dL — ABNORMAL LOW (ref 12.0–15.0)
MCH: 27.6 pg (ref 26.0–34.0)
MCHC: 32.3 g/dL (ref 30.0–36.0)
MCV: 85.4 fL (ref 80.0–100.0)
Platelets: 352 10*3/uL (ref 150–400)
RBC: 3.7 MIL/uL — ABNORMAL LOW (ref 3.87–5.11)
RDW: 15.9 % — ABNORMAL HIGH (ref 11.5–15.5)
WBC: 13.9 10*3/uL — ABNORMAL HIGH (ref 4.0–10.5)
nRBC: 0 % (ref 0.0–0.2)

## 2022-11-07 LAB — BASIC METABOLIC PANEL
Anion gap: 10 (ref 5–15)
BUN: 8 mg/dL (ref 8–23)
CO2: 28 mmol/L (ref 22–32)
Calcium: 8.5 mg/dL — ABNORMAL LOW (ref 8.9–10.3)
Chloride: 86 mmol/L — ABNORMAL LOW (ref 98–111)
Creatinine, Ser: 0.68 mg/dL (ref 0.44–1.00)
GFR, Estimated: >60 mL/min (ref >60)
Glucose, Bld: 184 mg/dL — ABNORMAL HIGH (ref 70–99)
Potassium: 4.1 mmol/L (ref 3.5–5.1)
Sodium: 124 mmol/L — ABNORMAL LOW (ref 135–145)

## 2022-11-07 LAB — CORTISOL: Cortisol, Plasma: 17.8 ug/dL

## 2022-11-07 LAB — TSH: TSH: 2.48 u[IU]/mL (ref 0.350–4.500)

## 2022-11-07 LAB — SODIUM, URINE, RANDOM: Sodium, Ur: 98 mmol/L

## 2022-11-07 LAB — GLUCOSE, CAPILLARY
Glucose-Capillary: 139 mg/dL — ABNORMAL HIGH (ref 70–99)
Glucose-Capillary: 206 mg/dL — ABNORMAL HIGH (ref 70–99)
Glucose-Capillary: 174 mg/dL — ABNORMAL HIGH (ref 70–99)
Glucose-Capillary: 236 mg/dL — ABNORMAL HIGH (ref 70–99)

## 2022-11-07 LAB — OSMOLALITY, URINE: Osmolality, Ur: 265 mOsm/kg — ABNORMAL LOW (ref 300–900)

## 2022-11-07 MED ORDER — GLUCERNA SHAKE PO LIQD
237.0000 mL | Freq: Two times a day (BID) | ORAL | Status: DC
  Administered 2022-11-07 – 2022-11-17 (×19): 237 mL via ORAL

## 2022-11-07 MED ORDER — BISACODYL 10 MG RE SUPP
10.0000 mg | Freq: Once | RECTAL | Status: AC
  Administered 2022-11-07: 10 mg via RECTAL
  Filled 2022-11-07: qty 1

## 2022-11-07 MED ORDER — ALBUMIN HUMAN 25 % IV SOLN
25.0000 g | Freq: Every day | INTRAVENOUS | Status: AC
  Administered 2022-11-07 – 2022-11-08 (×2): 25 g via INTRAVENOUS
  Filled 2022-11-07 (×2): qty 100

## 2022-11-07 NOTE — Progress Notes (Signed)
[place] [facility]  Date of Admission:  11/03/2022   Total days of inpatient antibiotics Righ shoulder PJI  Principal Problem:   Septic arthritis of shoulder, right (HCC) Active Problems:   Essential hypertension   Diabetes mellitus type 2, controlled (HCC)   Chronic diastolic CHF (congestive heart failure) (HCC)   End of life care   Hyponatremia          Assessment: Heidi Dominguez with history of bilateral shoulder arthroplasty, history of right shoulder septic arthroplasty admitted with: # Recurrent right shoulder PJI with abscess SP IR drain placement with Cx+ Ecoli -CT of right shoulder on 3/22 showed large peripheral enhancing periprosthetic fluid collection around proximal right humerus concerning for abscess. - IR aspirated right shoulder and noted 40 ml purulent fluid, samples sent Cx+ Ecoli -On Vanc and ceftriaxone, Cx held x 14 days.  -Previous right shoulder PJI due to E. coli per documentation previously managed on suppressive Cipro x 2.5 years until  05/04/2021.  Last surgery was I&D and ABX bead placement on 11/08/2018.  Unable to revisedue to poor bone stock and presence of longstem with cables. Recommendations: -Continue Ceftriaxone for now and if no other growth besides ecoli then can transition to cefadroxil 1gm bid tomorrow with ID clinic follow-up.  -. Per Lab Ecoli Cefazolin MIC <=4( Sens) hopefully we will able to offer a PO regimen. Will need indefinite suppression given hardware in place.    Microbiology:   Antibiotics: Cefepime 3/22-23 Ceftriaxone 3/24- Vancomycin 3/22- Cultures: Blood 3/22 NG 3/23 NG  SUBJECTIVE: Resting in bed. No new compalints Interval: Afebrile overnight. Wbc 12.9k   Review of Systems: Review of Systems  All other systems reviewed and are negative.    Scheduled Meds:  acidophilus  1 capsule Oral QHS   amLODipine  10 mg Oral Daily   artificial tears   Both Eyes BID   aspirin EC  162 mg Oral QHS    budesonide  0.5 mg Nebulization BH-qamhs   dextromethorphan-guaiFENesin  1 tablet Oral BID   diclofenac Sodium  2 g Topical TID   docusate sodium  100 mg Oral BID   furosemide  40 mg Oral Daily   heparin  5,000 Units Subcutaneous Q8H   insulin aspart  0-20 Units Subcutaneous TID WC   loratadine  10 mg Oral QHS   losartan  50 mg Oral Daily   pantoprazole  40 mg Oral Daily   polyethylene glycol  17 g Oral BID   polyvinyl alcohol  2 drop Both Eyes Daily   senna  1 tablet Oral BID   sodium chloride flush  5 mL Intracatheter Q8H   Continuous Infusions:  albumin human     cefTRIAXone (ROCEPHIN)  IV 2 g (11/06/22 1301)   PRN Meds:.acetaminophen **OR** acetaminophen, albuterol, melatonin, morphine injection, naLOXone (NARCAN)  injection, ondansetron (ZOFRAN) IV Allergies  Allergen Reactions   Augmentin [Amoxicillin-Pot Clavulanate] Diarrhea   Bactrim [Sulfamethoxazole-Trimethoprim] Diarrhea and Itching   Calan [Verapamil] Diarrhea   Catapres [Clonidine Hcl] Other (See Comments)    Unknown reaction Not documented on MAR    Codeine Other (See Comments)    "Makes me out of my head, loopy"   Hydrocodone Other (See Comments)    "makes me go out of my head, loopy"   Other Other (See Comments)    Any type of narcotics Feels "loopy"    Sulfa Antibiotics Itching   Zestril [Lisinopril] Cough   Azulfidine [Sulfasalazine] Itching  OBJECTIVE: Vitals:   11/06/22 2002 11/07/22 0402 11/07/22 0720 11/07/22 1035  BP:  (!) 144/59 (!) 148/67   Pulse:  80 85 76  Resp:  16  18  Temp:  98.7 F (37.1 C) 98.5 F (36.9 C)   TempSrc:  Oral Oral   SpO2: 93% 95% 95% 93%  Weight:      Height:       Body mass index is 32.73 kg/m.  Physical Exam Constitutional:      Appearance: Normal appearance.  HENT:     Head: Normocephalic and atraumatic.     Right Ear: Tympanic membrane normal.     Left Ear: Tympanic membrane normal.     Nose: Nose normal.     Mouth/Throat:     Mouth: Mucous  membranes are moist.  Eyes:     Extraocular Movements: Extraocular movements intact.     Conjunctiva/sclera: Conjunctivae normal.     Pupils: Pupils are equal, round, and reactive to light.  Cardiovascular:     Rate and Rhythm: Normal rate and regular rhythm.     Heart sounds: No murmur heard.    No friction rub. No gallop.  Pulmonary:     Effort: Pulmonary effort is normal.     Breath sounds: Normal breath sounds.  Abdominal:     General: Abdomen is flat.     Palpations: Abdomen is soft.  Musculoskeletal:     Comments: Right shoulder drain  Skin:    General: Skin is warm and dry.  Neurological:     General: No focal deficit present.     Mental Status: She is alert and oriented to person, place, and time.  Psychiatric:        Mood and Affect: Mood normal.       Lab Results Lab Results  Component Value Date   WBC 13.9 (H) 11/07/2022   HGB 10.2 (L) 11/07/2022   HCT 31.6 (L) 11/07/2022   MCV 85.4 11/07/2022   PLT 352 11/07/2022    Lab Results  Component Value Date   CREATININE 0.67 11/07/2022   BUN 9 11/07/2022   NA 124 (L) 11/07/2022   K 4.6 11/07/2022   CL 88 (L) 11/07/2022   CO2 27 11/07/2022    Lab Results  Component Value Date   ALT 11 11/07/2022   AST 14 (L) 11/07/2022   ALKPHOS 62 11/07/2022   BILITOT 0.5 11/07/2022        [name], [place] [facility] 11/07/2022, 11:34 AM

## 2022-11-07 NOTE — Consult Note (Signed)
Reason for Consult: Hyponatremia Referring Physician: Marylu Lund, MD [facility])  HPI:  89 year old woman with past medical history significant for chronic diastolic heart failure, recurrent pancreatitis and history of bilateral shoulder replacement who was admitted to the hospital 3 days ago with increasing right shoulder swelling and pain.  Records over the past year are notable for baseline hyponatremia 130-133 with acute worsening during hospitalization last month ago when she was admitted for acute exacerbation of chronic bronchitis secondary to RSV.  She was seen in the emergency room 2 weeks ago for hyperglycemia and found to have pseudo-hyponatremia of 116-122.  Concern was raised on this admission with her sodium being depressed at 122 that improved to 127 after discontinuing hydrochlorothiazide however has dropped again to 124 despite ongoing treatment with furosemide 40 mg daily.  Serum osmolality is noted to be low at 271.  With regards to her right shoulder, she had CT scan on 3/22 that showed large peripheral enhancing periprosthetic fluid collection concerning for abscess for which interventional radiology performed an aspiration with drain placement revealing E. coli infection with antibiotic therapy subsequently narrowed down to intravenous ceftriaxone.  She is not on any NSAIDs.  She reports that her appetite is improving after originally being poor and complains of constipation.  She has had some intermittent nausea and vomiting but does not have any significant shoulder pain.  She reports to be having a productive cough without fevers or chills.  Past Medical History:  Diagnosis Date   Acute pancreatitis    hx of    Arthritis    "qwhere" (07/04/2018)   Chronic back pain    "all over" (07/04/2018)   Chronic diastolic CHF (congestive heart failure) (HCC)    Chronic kidney disease    stage  3 chronic kidney disease    Complication of anesthesia    "I have a hard time waking up"    Degenerative joint disease of shoulder region    Dry eye syndrome    Dyspnea    Family history of adverse reaction to anesthesia    "daughter has the shakes when she wakes up"   GERD (gastroesophageal reflux disease)    Heart murmur    High cholesterol    History of hiatal hernia    HOH (hard of hearing)    Hypertension    Impacted cerumen of both ears    hx of    Muscle weakness (generalized)    Osteoarthritis    Overactive bladder    Pancreatitis    hx of    Phlebitis    "BLE"   Rotator cuff tear    right    Tear of right supraspinatus tendon    Thrombocytopenia (HCC)    hx of    Type II diabetes mellitus (HCC)    Urinary tract infection    hx of    UTI (urinary tract infection) 09/12/2018   Xerosis cutis    hx of     Past Surgical History:  Procedure Laterality Date   ABDOMINAL AORTOGRAM W/LOWER EXTREMITY N/A 12/22/2020   Procedure: ABDOMINAL AORTOGRAM W/LOWER EXTREMITY;  Surgeon: Cherre Robins, MD;  Location: [place] [facility];  Service: Cardiovascular;  Laterality: N/A;   ABDOMINAL HYSTERECTOMY     BACK SURGERY     CATARACT EXTRACTION W/ INTRAOCULAR LENS  IMPLANT, BILATERAL Bilateral    CHOLECYSTECTOMY  2016   ERCP N/A 08/30/2018   Procedure: ENDOSCOPIC RETROGRADE CHOLANGIOPANCREATOGRAPHY (ERCP);  Surgeon: Clarene Essex, MD;  Location: [facility];  Service: Endoscopy;  Laterality: N/A;   FIXATION KYPHOPLASTY     INCISION AND DRAINAGE Right 11/08/2018   Procedure: INCISION AND DRAINAGE right shoulder, placement of antibiotic beads ;  Surgeon: [name], MD;  Location: [facility];  Service: Orthopedics;  Laterality: Right;   IR US GUIDE BX ASP/DRAIN  11/04/2022   IR US GUIDE BX ASP/DRAIN  11/04/2022   JOINT REPLACEMENT     LAPAROSCOPIC CHOLECYSTECTOMY SINGLE SITE WITH INTRAOPERATIVE CHOLANGIOGRAM N/A 04/11/2015   Procedure: LAPAROSCOPIC LYSIS OF ADHESIONS, LAPAROSCOPIC CHOLECYSTECTOMY WITH INTRAOPERATIVE CHOLANGIOGRAM;  Surgeon: Michael Boston, MD;  Location: [facility];   Service: General;  Laterality: N/A;   PANCREATIC STENT PLACEMENT  08/30/2018   Procedure: PANCREATIC STENT PLACEMENT;  Surgeon: Clarene Essex, MD;  Location: [facility];  Service: Endoscopy;;   PERIPHERAL VASCULAR INTERVENTION  12/22/2020   Procedure: PERIPHERAL VASCULAR INTERVENTION;  Surgeon: Cherre Robins, MD;  Location: [place] [facility];  Service: Cardiovascular;;  left anterior tibial artery   REMOVAL OF STONES  08/30/2018   Procedure: REMOVAL OF STONES;  Surgeon: Clarene Essex, MD;  Location: [facility];  Service: Endoscopy;;   SHOULDER OPEN ROTATOR CUFF REPAIR Bilateral    SPHINCTEROTOMY  08/30/2018   Procedure: SPHINCTEROTOMY;  Surgeon: Clarene Essex, MD;  Location: [facility];  Service: Endoscopy;;   TOTAL KNEE ARTHROPLASTY Bilateral     Family History  Problem Relation Age of Onset   Hypertension Mother    Diabetes Mellitus I Mother    Hypertension Father     Social History:  reports that she has never smoked. She has never used smokeless tobacco. She reports that she does not drink alcohol and does not use drugs.  Allergies:  Allergies  Allergen Reactions   Augmentin [Amoxicillin-Pot Clavulanate] Diarrhea   Bactrim [Sulfamethoxazole-Trimethoprim] Diarrhea and Itching   Calan [Verapamil] Diarrhea   Catapres [Clonidine Hcl] Other (See Comments)    Unknown reaction Not documented on MAR    Codeine Other (See Comments)    "Makes me out of my head, loopy"   Hydrocodone Other (See Comments)    "makes me go out of my head, loopy"   Other Other (See Comments)    Any type of narcotics Feels "loopy"    Sulfa Antibiotics Itching   Zestril [Lisinopril] Cough   Azulfidine [Sulfasalazine] Itching    Medications: I have reviewed the patient's current medications. Scheduled:  acidophilus  1 capsule Oral QHS   amLODipine  10 mg Oral Daily   artificial tears   Both Eyes BID   aspirin EC  162 mg Oral QHS   budesonide  0.5 mg Nebulization BH-qamhs    dextromethorphan-guaiFENesin  1 tablet Oral BID   diclofenac Sodium  2 g Topical TID   docusate sodium  100 mg Oral BID   furosemide  40 mg Oral Daily   heparin  5,000 Units Subcutaneous Q8H   insulin aspart  0-20 Units Subcutaneous TID WC   loratadine  10 mg Oral QHS   losartan  50 mg Oral Daily   pantoprazole  40 mg Oral Daily   polyethylene glycol  17 g Oral BID   polyvinyl alcohol  2 drop Both Eyes Daily   senna  1 tablet Oral BID   sodium chloride flush  5 mL Intracatheter Q8H   Continuous:  cefTRIAXone (ROCEPHIN)  IV 2 g (11/06/22 1301)      Latest Ref Rng & Units 11/07/2022    3:33 AM 11/06/2022    4:40 AM 11/05/2022  4:43 AM  BMP  Glucose 70 - 99 mg/dL 138  160  121   BUN 8 - 23 mg/dL 9  13  18    Creatinine 0.44 - 1.00 mg/dL 0.67  0.60  0.79   Sodium 135 - 145 mmol/L 124  124  127   Potassium 3.5 - 5.1 mmol/L 4.6  4.6  4.9   Chloride 98 - 111 mmol/L 88  91  90   CO2 22 - 32 mmol/L 27  26  29    Calcium 8.9 - 10.3 mg/dL 8.4  8.2  8.3       Latest Ref Rng & Units 11/07/2022    3:33 AM 11/06/2022    4:40 AM 11/05/2022    4:43 AM  CBC  WBC 4.0 - 10.5 K/uL 13.9  12.6  12.8   Hemoglobin 12.0 - 15.0 g/dL 10.2  10.0  10.0   Hematocrit 36.0 - 46.0 % 31.6  31.7  31.0   Platelets 150 - 400 K/uL 352  375  391    Urinalysis    Component Value Date/Time   COLORURINE STRAW (A) 11/03/2022 1114   APPEARANCEUR CLEAR 11/03/2022 1114   LABSPEC 1.008 11/03/2022 1114   PHURINE 7.0 11/03/2022 1114   GLUCOSEU NEGATIVE 11/03/2022 1114   HGBUR NEGATIVE 11/03/2022 1114   BILIRUBINUR NEGATIVE 11/03/2022 1114   KETONESUR NEGATIVE 11/03/2022 1114   PROTEINUR NEGATIVE 11/03/2022 1114   UROBILINOGEN 1.0 04/07/2015 2312   NITRITE NEGATIVE 11/03/2022 1114   LEUKOCYTESUR NEGATIVE 11/03/2022 1114     DG Abd 1 View  Result Date: 11/05/2022 CLINICAL DATA:  Constipation for several days EXAM: ABDOMEN - 1 VIEW COMPARISON:  09/16/2018 FINDINGS: Scattered large and small bowel gas is noted.  Retained fecal material is noted throughout the colon consistent with a moderate degree of constipation. No obstructive changes are seen. No free air is noted. Changes of prior vertebral augmentation are seen and stable. Degenerative changes of lumbar spine are noted. IMPRESSION: Changes consistent with moderate colonic constipation. Electronically Signed   By: Inez Catalina M.D.   On: 11/05/2022 20:27    Review of Systems  Constitutional:  Positive for appetite change. Negative for chills and fever.  HENT:  Negative for nosebleeds, sinus pressure, sinus pain and sore throat.   Eyes:  Negative for pain and visual disturbance.  Respiratory:  Positive for cough. Negative for chest tightness and shortness of breath.   Cardiovascular:  Negative for chest pain and leg swelling.  Gastrointestinal:  Positive for constipation. Negative for abdominal pain, diarrhea and nausea.  Endocrine: Negative for polyphagia and polyuria.  Genitourinary:  Negative for dysuria, hematuria and urgency.  Musculoskeletal:  Positive for back pain.  Skin:  Negative for rash and wound.  Neurological:  Positive for weakness. Negative for headaches.   Blood pressure (!) 148/67, pulse 85, temperature 98.5 F (36.9 C), temperature source Oral, resp. rate 16, height 5\' 4"  (1.626 m), weight 86.5 kg, SpO2 95 %. Physical Exam Constitutional:      General: She is not in acute distress.    Appearance: She is obese. She is not ill-appearing.  HENT:     Head: Normocephalic and atraumatic.     Right Ear: External ear normal.     Left Ear: External ear normal.     Nose: Nose normal.     Mouth/Throat:     Mouth: Mucous membranes are moist.     Pharynx: Oropharynx is clear. No posterior oropharyngeal erythema.  Eyes:  General: No scleral icterus.    Extraocular Movements: Extraocular movements intact.     Conjunctiva/sclera: Conjunctivae normal.     Pupils: Pupils are equal, round, and reactive to light.  Cardiovascular:      Rate and Rhythm: Normal rate and regular rhythm.     Heart sounds: Murmur heard.     Comments: 4/6 blowing systolic murmur Pulmonary:     Effort: Pulmonary effort is normal.     Breath sounds: Wheezing present. No rales.     Comments: Intermittent expiratory wheezes Abdominal:     General: Abdomen is flat. Bowel sounds are normal. There is no distension.     Palpations: Abdomen is soft.     Tenderness: There is no abdominal tenderness.  Musculoskeletal:     Cervical back: Normal range of motion and neck supple.     Right lower leg: Edema present.     Left lower leg: Edema present.     Comments: Trace-1+ pretibial edema with some dependent edema over thighs.  Drains present right shoulder  Lymphadenopathy:     Cervical: No cervical adenopathy.  Skin:    General: Skin is warm and dry.  Neurological:     Mental Status: She is alert.     Assessment/Plan: 1.  Hyponatremia: Appears acute on chronic true hypoosmolar hyponatremia in slightly hypervolemic patient.  I suspect that she has ADH excess based on history/timeline of events and available data including physical exam-confirmation based on urine osmolality.  She clearly has had decreased solute intake with additional non-osmolar triggers for ADH release with shoulder pain/respiratory infection/nausea/vomiting and I agree with the plan for fluid restriction and continued loop diuretic use along with symptom management of nausea/vomiting.  She does not have any indication for hypertonic saline and if sodium levels do not improve or worsen with the current management plan, may consider treatment with tolvaptan if urine osmolality is inappropriately elevated.  I have ordered for albumin infusion to help intravascular volume expansion and provide sodium load without significant volume load.  Her hypoalbuminemia is likely a result of recent hospitalization/infection and acute illness along with poor solute intake. 2.  Septic arthritis of right  shoulder: Abscess drainage revealed E. coli infection for which she is currently on ceftriaxone based on sensitivity panel. 3.  Chronic diastolic heart failure: Mild/trace lower extremity edema is present for which we will continue loop diuretic and oral fluid restriction. 4.  Hypertension: Blood pressures marginally elevated but acceptable range on combination of amlodipine, losartan and furosemide.  Daneli Butkiewicz K. 11/07/2022, 10:11 AM

## 2022-11-07 NOTE — Progress Notes (Signed)
  Progress Note   Patient: Heidi Dominguez [id number] DOB: [birth year] DOA: 11/03/2022     4 DOS: the patient was seen and examined on 11/07/2022   Brief hospital course: 89 y/o with h/o bilateral shoulder replacement, previous right septic shoulder requiring antibiotic beads presents due to increased swelling of the right shoulder and pain with movement. She has not had a fever, rigors. She has a residual cough from a recent bout of RSV but otherwise has been doing well. Due to her symptoms she was transported to [facility] for evaluation.   Assessment and Plan: * Septic arthritis of shoulder, right (HCC) H/o bilateral shoulder replacement and prior infection right prosthetic shoulder.  -Presented with increased swelling and pain, leukocytosis.  -CT shoulder confirms large collection of fluid -Ortho following. Pt now s/p drain placement in R shoulder. Per IR, consideration for drain removal if output is <10cc/day. IR to assess drain for possible removal prior to d/c -cultures thus far pos for ecoli -Appreciate input by ID.  Rec to continue Rocephin for now. If no other growth, then transition to cefadroxil 1g bid starting 3/27 with ID f/u   Hyponatremia -despite fluid restriction and lasix, sodium lower today to 124 -Urine Na 98, urine osm low at 265, serum osm low at 271 -Appreciate input by Nephrology. Agrees with fluid restriction and lasix. If sodium levels do not improve, then may consider tolvaptan   Chronic diastolic CHF (congestive heart failure) ([place]) Last Echo 04/09/2015 with EF 60-65%, Grade III DD. She has been managed with HCTZ and low dose lasix. -D/c HCTZ and would not resume again in the future given hyponatremia -continue Lasix 40 mg daily -cont losartan, ASA   Diabetes mellitus type 2, controlled ([place]) Last A1C 10/02/22 7.5%.  -metformin was continued at presentation -Cont SSI as needed  Essential hypertension BP remains stable -Cont lasix, losartan, norvasc  Nausea  vomiting -Recently had nausea and vomiting -xray ordered and reviewed, findings notable for constipation -still no BM despite mirlalax. Will give trial of dulcolax suppository     Subjective: states feeling better today  Physical Exam: Vitals:   11/06/22 2002 11/07/22 0402 11/07/22 0720 11/07/22 1035  BP:  (!) 144/59 (!) 148/67   Pulse:  80 85 76  Resp:  16  18  Temp:  98.7 F (37.1 C) 98.5 F (36.9 C)   TempSrc:  Oral Oral   SpO2: 93% 95% 95% 93%  Weight:      Height:       General exam: Conversant, in no acute distress Respiratory system: normal chest rise, clear, no audible wheezing Cardiovascular system: regular rhythm, s1-s2 Gastrointestinal system: Nondistended, nontender, pos BS Central nervous system: No seizures, no tremors Extremities: No cyanosis, no joint deformities Skin: No rashes, no pallor Psychiatry: Affect normal // no auditory hallucinations   Data Reviewed:  Labs reviewed: Na 124, K 4.6, Cr 0.67, WBC 13.9  Family Communication: Pt in room, family at bedside  Disposition: Status is: Inpatient Remains inpatient appropriate because: Severity of illness  Planned Discharge Destination: Skilled nursing facility    Author: Marylu Lund, MD 11/07/2022 1:53 PM  For on call review [email or web address].

## 2022-11-08 DIAGNOSIS — M009 Pyogenic arthritis, unspecified: Secondary | ICD-10-CM | POA: Diagnosis not present

## 2022-11-08 LAB — CULTURE, BLOOD (ROUTINE X 2)
Culture: NO GROWTH
Special Requests: ADEQUATE

## 2022-11-08 LAB — COMPREHENSIVE METABOLIC PANEL
ALT: 12 U/L (ref 0–44)
AST: 14 U/L — ABNORMAL LOW (ref 15–41)
Albumin: 1.9 g/dL — ABNORMAL LOW (ref 3.5–5.0)
Alkaline Phosphatase: 63 U/L (ref 38–126)
Anion gap: 9 (ref 5–15)
BUN: 10 mg/dL (ref 8–23)
CO2: 30 mmol/L (ref 22–32)
Calcium: 8.5 mg/dL — ABNORMAL LOW (ref 8.9–10.3)
Chloride: 86 mmol/L — ABNORMAL LOW (ref 98–111)
Creatinine, Ser: 0.62 mg/dL (ref 0.44–1.00)
GFR, Estimated: >60 mL/min (ref >60)
Glucose, Bld: 157 mg/dL — ABNORMAL HIGH (ref 70–99)
Potassium: 4.1 mmol/L (ref 3.5–5.1)
Sodium: 125 mmol/L — ABNORMAL LOW (ref 135–145)
Total Bilirubin: 0.3 mg/dL (ref 0.3–1.2)
Total Protein: 5.5 g/dL — ABNORMAL LOW (ref 6.5–8.1)

## 2022-11-08 LAB — CBC
HCT: 28.7 % — ABNORMAL LOW (ref 36.0–46.0)
Hemoglobin: 9.5 g/dL — ABNORMAL LOW (ref 12.0–15.0)
MCH: 27.7 pg (ref 26.0–34.0)
MCHC: 33.1 g/dL (ref 30.0–36.0)
MCV: 83.7 fL (ref 80.0–100.0)
Platelets: 365 10*3/uL (ref 150–400)
RBC: 3.43 MIL/uL — ABNORMAL LOW (ref 3.87–5.11)
RDW: 15.8 % — ABNORMAL HIGH (ref 11.5–15.5)
WBC: 13 10*3/uL — ABNORMAL HIGH (ref 4.0–10.5)
nRBC: 0 % (ref 0.0–0.2)

## 2022-11-08 LAB — GLUCOSE, CAPILLARY
Glucose-Capillary: 181 mg/dL — ABNORMAL HIGH (ref 70–99)
Glucose-Capillary: 255 mg/dL — ABNORMAL HIGH (ref 70–99)
Glucose-Capillary: 199 mg/dL — ABNORMAL HIGH (ref 70–99)
Glucose-Capillary: 210 mg/dL — ABNORMAL HIGH (ref 70–99)

## 2022-11-08 LAB — SODIUM: Sodium: 126 mmol/L — ABNORMAL LOW (ref 135–145)

## 2022-11-08 LAB — PHOSPHORUS: Phosphorus: 2.9 mg/dL (ref 2.5–4.6)

## 2022-11-08 MED ORDER — TOLVAPTAN 15 MG PO TABS
15.0000 mg | ORAL_TABLET | Freq: Once | ORAL | Status: AC
  Administered 2022-11-08: 15 mg via ORAL
  Filled 2022-11-08 (×2): qty 1

## 2022-11-08 NOTE — Plan of Care (Signed)

## 2022-11-08 NOTE — Progress Notes (Signed)
Occupational Therapy Treatment Patient Details Name: Heidi Dominguez MRN: [id number] DOB: 1921/02/01 Today's Date: 11/08/2022   History of present illness Heidi Dominguez is a 89 y.o. female admitted on 11/03/2022 with shoulder infection. 2x drains placed. PMHx: arthritis, CHF, CKD, Dyspnea, GERD, HOH, HTN, OA, bilateral shoulder replacements   OT comments  Pt is making good progress towards their acute OT goals. Pt seen with PT to safety progress OOB. Pt with improved bed mobility, sitting tolerance, standing ability and activity tolerance. Overall she required mod A+2 to get to the EOB, and min-mod A +2 to stand in stedy frame. Peri care dependently completed in standing. OT to continue to follow acutely to facilitate progress towards established goals. Pt will continue to benefit from skilled inpatient follow up therapy, <3 hours/day.     Recommendations for follow up therapy are one component of a multi-disciplinary discharge planning process, led by the attending physician.  Recommendations may be updated based on patient status, additional functional criteria and insurance authorization.    Assistance Recommended at Discharge Frequent or constant Supervision/Assistance  Patient can return home with the following  A lot of help with walking and/or transfers;Two people to help with walking and/or transfers;A lot of help with bathing/dressing/bathroom;Two people to help with bathing/dressing/bathroom;Assistance with cooking/housework;Assistance with feeding;Direct supervision/assist for medications management;Direct supervision/assist for financial management;Assist for transportation;Help with stairs or ramp for entrance   Equipment Recommendations  None recommended by OT    Recommendations for Other Services      Precautions / Restrictions Precautions Precautions: Fall Precaution Comments: 2x drains R shoulder Restrictions Weight Bearing Restrictions: No       Mobility Bed  Mobility Overal bed mobility: Needs Assistance Bed Mobility: Supine to Sit     Supine to sit: Mod assist, +2 for physical assistance     General bed mobility comments: mod +2 for trunk and LE management, scooting to EOB with use of bed pad and fitted sheet, pt with good initiation of LE and trunk movement    Transfers Overall transfer level: Needs assistance Equipment used: Ambulation equipment used, 2 person hand held assist Transfers: Sit to/from Stand Sit to Stand: Max assist, +2 physical assistance, Mod assist           General transfer comment: initially max +2 when sit>stand with HHA +2, transitioned to stedy for pt comfort and facilitation with improvement to mod +2 for hip rise and steadying. Sit<>stand x4, from EOB x2 and stedy x2.     Balance Overall balance assessment: Needs assistance Sitting-balance support: Feet supported Sitting balance-Leahy Scale: Fair Sitting balance - Comments: can sit EOB without PT or OT support   Standing balance support: Single extremity supported, During functional activity Standing balance-Leahy Scale: Poor                             ADL either performed or assessed with clinical judgement   ADL Overall ADL's : Needs assistance/impaired                         Toilet Transfer: Total assistance Toilet Transfer Details (indicate cue type and reason): via stedy Toileting- Clothing Manipulation and Hygiene: Total assistance;+2 for safety/equipment Toileting - Clothing Manipulation Details (indicate cue type and reason): dependent for peri care while standing in stedy frame     Functional mobility during ADLs: Moderate assistance;+2 for physical assistance;+2 for safety/equipment (with stedy) General ADL Comments:  Pt stood 3x in stedy frame with min-mod A +2. stood for ~1 minute for peri care    Extremity/Trunk Assessment Upper Extremity Assessment Upper Extremity Assessment: Defer to OT evaluation RUE  Deficits / Details: drians, very minimal AROM. guarding in flexor pattern against body. globally weak RUE Coordination: decreased fine motor;decreased gross motor LUE Deficits / Details: more AROM at shoulder than R, still limited due to pain. elbow, wirst and hand ROM are WFL LUE Coordination: decreased fine motor;decreased gross motor   Lower Extremity Assessment Lower Extremity Assessment: Defer to PT evaluation   Cervical / Trunk Assessment Cervical / Trunk Assessment: Kyphotic    Vision Baseline Vision/History: 0 No visual deficits Vision Assessment?: No apparent visual deficits Additional Comments:  (seemingly WFL)   Perception     Praxis      Cognition Arousal/Alertness: Awake/alert Behavior During Therapy: WFL for tasks assessed/performed Overall Cognitive Status: No family/caregiver present to determine baseline cognitive functioning                                 General Comments: pleasant, follows commands well        Exercises      Shoulder Instructions       General Comments assist for pericare, pt soiled in urine and min stool    Pertinent Vitals/ Pain       Pain Assessment Pain Assessment: Faces Faces Pain Scale: Hurts a little bit Pain Location: R shoulder Pain Descriptors / Indicators: Discomfort, Grimacing Pain Intervention(s): Limited activity within patient's tolerance, Monitored during session  Home Living Family/patient expects to be discharged to:: Skilled nursing facility                                 Additional Comments: LTC, pt states she has been working with PT      Prior Functioning/Environment              Frequency  Min 2X/week        Progress Toward Goals  OT Goals(current goals can now be found in the care plan section)     Acute Rehab OT Goals Patient Stated Goal: to get better OT Goal Formulation: With patient Time For Goal Achievement: 11/20/22 Potential to Achieve Goals:  Good  Plan      Co-evaluation    PT/OT/SLP Co-Evaluation/Treatment: Yes Reason for Co-Treatment: Complexity of the patient's impairments (multi-system involvement);For patient/therapist safety;To address functional/ADL transfers PT goals addressed during session: Mobility/safety with mobility;Balance OT goals addressed during session: ADL's and self-care      AM-PAC OT "6 Clicks" Daily Activity     Outcome Measure   Help from another person eating meals?: A Little Help from another person taking care of personal grooming?: A Little Help from another person toileting, which includes using toliet, bedpan, or urinal?: Total Help from another person bathing (including washing, rinsing, drying)?: A Lot Help from another person to put on and taking off regular upper body clothing?: A Lot Help from another person to put on and taking off regular lower body clothing?: Total 6 Click Score: 12    End of Session Equipment Utilized During Treatment: Gait belt      Activity Tolerance Patient tolerated treatment well   Patient Left in bed;with call bell/phone within reach;with bed alarm set   Nurse Communication Mobility status  Time: [id number] OT Time Calculation (min): 28 min  Charges: OT General Charges $OT Visit: 1 Visit OT Treatments $Self Care/Home Management : 8-22 mins  Shade Flood, OTR/L [place] Office [phone] Secure Chat Communication Preferred   Elliot Cousin 11/08/2022, 2:29 PM

## 2022-11-08 NOTE — Progress Notes (Signed)
Physical Therapy Treatment Patient Details Name: Heidi Dominguez MRN: [id number] DOB: Jun 23, 1921 Today's Date: 11/08/2022   History of Present Illness Heidi Dominguez is a 89 y.o. female admitted on 11/03/2022 with shoulder infection. 2x drains placed. PMHx: arthritis, CHF, CKD, Dyspnea, GERD, HOH, HTN, OA, bilateral shoulder replacements    PT Comments    Pt motivated to progress OOB this session. Pt requiring mod +2 assist for repeated transfers, pt benefited from use of sara stedy for pt UE support and practicing stands from elevated height. Pt noted to be saturated in urine and min stool upon arrival to room, PT and OT assisting with clean up and linen change. Pt progressing slowly but well, will continue to follow.     Recommendations for follow up therapy are one component of a multi-disciplinary discharge planning process, led by the attending physician.  Recommendations may be updated based on patient status, additional functional criteria and insurance authorization.  Follow Up Recommendations       Assistance Recommended at Discharge Frequent or constant Supervision/Assistance  Patient can return home with the following Assist for transportation;Help with stairs or ramp for entrance;Assistance with cooking/housework;A lot of help with walking and/or transfers;A lot of help with bathing/dressing/bathroom   Equipment Recommendations  None recommended by PT    Recommendations for Other Services       Precautions / Restrictions Precautions Precautions: Fall Restrictions Weight Bearing Restrictions: No     Mobility  Bed Mobility Overal bed mobility: Needs Assistance Bed Mobility: Supine to Sit     Supine to sit: Mod assist, +2 for physical assistance     General bed mobility comments: mod +2 for trunk and LE management, scooting to EOB with use of bed pad and fitted sheet, pt with good initiation of LE and trunk movement    Transfers Overall transfer level: Needs  assistance Equipment used: 2 person hand held assist, Ambulation equipment used Transfers: Sit to/from Stand Sit to Stand: Max assist, +2 physical assistance, Mod assist           General transfer comment: initially max +2 when sit>stand with HHA +2, transitioned to stedy for pt comfort and facilitation with improvement to mod +2 for hip rise and steadying. Sit<>stand x4, from EOB x2 and stedy x2.    Ambulation/Gait                   Stairs             Wheelchair Mobility    Modified Rankin (Stroke Patients Only)       Balance Overall balance assessment: Needs assistance Sitting-balance support: Feet supported Sitting balance-Leahy Scale: Fair Sitting balance - Comments: can sit EOB without PT or OT support   Standing balance support: Single extremity supported, During functional activity Standing balance-Leahy Scale: Poor                              Cognition Arousal/Alertness: Awake/alert Behavior During Therapy: WFL for tasks assessed/performed Overall Cognitive Status: No family/caregiver present to determine baseline cognitive functioning                                 General Comments: pleasant, follows commands well        Exercises      General Comments General comments (skin integrity, edema, etc.): assist for pericare, pt soiled in urine and  min stool      Pertinent Vitals/Pain Pain Assessment Pain Assessment: Faces Faces Pain Scale: Hurts little more Pain Location: R shoulder Pain Descriptors / Indicators: Discomfort, Grimacing Pain Intervention(s): Limited activity within patient's tolerance, Monitored during session, Repositioned    Home Living                          Prior Function            PT Goals (current goals can now be found in the care plan section) Acute Rehab PT Goals Patient Stated Goal: To continue to work with PT and get stronger PT Goal Formulation: With patient Time  For Goal Achievement: 11/20/22 Potential to Achieve Goals: Fair Progress towards PT goals: Progressing toward goals    Frequency    Min 2X/week      PT Plan Current plan remains appropriate    Co-evaluation PT/OT/SLP Co-Evaluation/Treatment: Yes Reason for Co-Treatment: To address functional/ADL transfers;For patient/therapist safety PT goals addressed during session: Mobility/safety with mobility;Balance        AM-PAC PT "6 Clicks" Mobility   Outcome Measure  Help needed turning from your back to your side while in a flat bed without using bedrails?: A Lot Help needed moving from lying on your back to sitting on the side of a flat bed without using bedrails?: A Lot Help needed moving to and from a bed to a chair (including a wheelchair)?: A Lot Help needed standing up from a chair using your arms (e.g., wheelchair or bedside chair)?: A Lot Help needed to walk in hospital room?: Total Help needed climbing 3-5 steps with a railing? : Total 6 Click Score: 10    End of Session Equipment Utilized During Treatment: Gait belt Activity Tolerance: Patient tolerated treatment well Patient left: in chair;with call bell/phone within reach;with chair alarm set Nurse Communication: Mobility status PT Visit Diagnosis: Other abnormalities of gait and mobility (R26.89)     Time: [id number] PT Time Calculation (min) (ACUTE ONLY): 28 min  Charges:  $Therapeutic Activity: 8-22 mins                     [name], PT DPT Acute Rehabilitation Services Pager [phone]  Office [phone]    [name] 11/08/2022, 1:54 PM

## 2022-11-08 NOTE — Progress Notes (Signed)
[facility] PROGRESS NOTE  Heidi Dominguez (DOB: 12/23/1920) [id number] PCP: Lajean Manes, MD  Brief Narrative: Heidi Dominguez is a 89 y.o. female with a history of chronic HFpEF, T2DM, HTN, getting over recent RSV infection, who presented to the ED on 11/03/2022 with right shoulder pain and swelling with leukocytosis, and large fluid collection on CT. This was aspirated with drain placement by IR on 3/23. Cultures have grown E. coli for which ID has been guiding antibiotic therapy. Orthopedics also following. Hospitalization complicated by hyponatremia, SIADH for which nephrology was consulted. Fluid restriction was initiated without significant change, tolvaptan is to be administered 3/27.    Subjective: Pain controlled, ate all of breakfast, feeling generally better. Had 2 BMs yesterday. No abd pain.   Objective: BP (!) 147/61 (BP Location: Left Arm)   Pulse 64   Temp 97.7 F (36.5 C) (Oral)   Resp 18   Ht 5\' 4"  (1.626 m)   Wt 86.5 kg   SpO2 (!) 76%   BMI 32.73 kg/m   Gen: No distress Pulm: Clear, nonlabored  CV: RRR with holosystolic murmur II/VI greatest at USB with muffling of S1. Trace pitting edema GI: Soft, NT, ND, +BS Neuro: Alert and oriented. No new focal deficits. Ext: Warm, no deformities. Decreased RUE shoulder ROM, intact distally Skin: Drains in right shoulder without discharge around them, no erythema. Mildly appropriately tender. Dressing c/d/I. No other rashes, lesions or ulcers on visualized skin   Assessment & Plan: Septic arthritis and abscess of right shoulder, prosthetic joint infection: s/p 40cc purulent aspirated fluid +E. coli on 3/23. She has history of E. coli infection of this joint s/p 2.5 years of suppressive ciprofloxacin completed Sept 2022.  - Orthopedics consulted, very poor surgical candidate in this 89yo. Dr. Veverly Fells recommended, and pt/family consented to, trial of aspiration and antibiotics in lieu of open I&D and bead placement.   -  IR placed lateral 10Fr drain (labeled #1) w/130cc pink purulent fluid output and more medial/anterior 8Fr drain (labeled #2) with 40cc yellow purulent fluid output. Will continue TID 5cc NS flushes (can decrease to daily at discharge) and output recording qShift. Plan to inject/remove drain once daily output < 10cc. Dressing changes q2-3 days and prn. F/u in IR drain clinic in 10-14 days. - ID consulted, continued on ceftriaxone until no further culture growth and will be transitioned to cefadroxil 1g po BID thereafter. Will need RCID follow up and indefinite suppression given hardware in place. Note cultures being held x14 days for P. acnes.  - Will require SNF rehabilitation   Hyponatremia, SIADH: Acute on chronic hypoosmolar hyponatremia, asymptomatic, stable.  - Nephrology consult appreciated, giving tolvaptan, checking serial Na, and relaxing fluid restriction today. Continue lasix as below.     Acute on chronic HFpE, HTN: Last Echo 04/09/2015 with EF 60-65%, Grade I DD. She has been managed with HCTZ and low dose lasix. - Stopped HCTZ. Giving lasix 40mg  daily.  - Continue BP management with losartan, norvasc.  - Pt has considerable murmur and only mild AS on last echo in 2016. We will update echo now.    Controlled T2DM: HbA1c 7.5%.  - Hold metformin while admitted - Continue SSI - On statin, ASA (home dose is 162 qHS)   Nausea and vomiting: Only XR abnormality is constipation.  - Continue daily bowel regimen, had BM 3/26 per pt after suppository. Abd exam benign, eating well with resolved N/V.   Recent RSV infection:  - Continue inhaled therapies, no  hypoxia.   GERD:  - Continue PPI  Patrecia Pour, MD [facility] [email or web address] 11/08/2022, 11:07 AM

## 2022-11-08 NOTE — Progress Notes (Signed)
Patient ID: GLENYCE MUNNS, female   DOB: 03-Feb-1921, 89 y.o.   MRN: [id number] [facility] Progress Note   Assessment/ Plan:   1.  Hyponatremia: Acute on chronic hypoosmolar hyponatremia in slightly hypervolemic patient.  Likely SIADH with superimposed non-osmolar triggers for ADH release.  Without neurological symptoms and unchanged sodium level overnight for which I will order a dose of tolvaptan today and relax fluid restriction. 2.  Septic arthritis of right shoulder: Abscess drainage revealed E. coli infection for which she is currently on ceftriaxone based on sensitivity panel. 3.  Chronic diastolic heart failure: Mild/trace lower extremity edema is present for which we will continue loop diuretic and oral fluid restriction. 4.  Hypertension: Blood pressures marginally elevated but acceptable range on combination of amlodipine, losartan and furosemide.  Subjective:   Reports to be feeling better today and reports that she does not have any nausea-ate her entire breakfast   Objective:   BP (!) 147/61 (BP Location: Left Arm)   Pulse 64   Temp 97.7 F (36.5 C) (Oral)   Resp 18   Ht 5\' 4"  (1.626 m)   Wt 86.5 kg   SpO2 (!) 76%   BMI 32.73 kg/m   Intake/Output Summary (Last 24 hours) at 11/08/2022 0841 Last data filed at 11/08/2022 0511 Gross per 24 hour  Intake 1390.74 ml  Output 1390 ml  Net 0.74 ml   Weight change:   Physical Exam: Gen: Comfortably resting in bed, just finished breakfast CVS: Pulse regular rhythm, normal rate, S1 and S2 normal Resp: Breath sounds bilaterally, no rales/rhonchi Abd: Soft, obese, nontender, bowel sounds normal Ext: Trace-1+ bilateral lower extremity edema.  Right shoulder drains present  Imaging: No results found.  Labs: BMET Recent Labs  Lab 11/03/22 1136 11/04/22 0135 11/05/22 0443 11/06/22 0440 11/07/22 0333 11/07/22 1508 11/08/22 0350  NA 122* 125* 127* 124* 124* 124* 125*  K 5.1 5.1 4.9 4.6 4.6 4.1 4.1  CL  84* 86* 90* 91* 88* 86* 86*  CO2 27 31 29 26 27 28 30   GLUCOSE 307* 151* 121* 160* 138* 184* 157*  BUN 17 15 18 13 9 8 10   CREATININE 0.75 0.79 0.79 0.60 0.67 0.68 0.62  CALCIUM 8.4* 8.7* 8.3* 8.2* 8.4* 8.5* 8.5*  PHOS  --   --   --   --   --   --  2.9   CBC Recent Labs  Lab 11/03/22 1136 11/05/22 0443 11/06/22 0440 11/07/22 0333 11/08/22 0350  WBC 15.5* 12.8* 12.6* 13.9* 13.0*  NEUTROABS 13.1*  --   --   --   --   HGB 11.2* 10.0* 10.0* 10.2* 9.5*  HCT 34.3* 31.0* 31.7* 31.6* 28.7*  MCV 84.7 84.7 85.7 85.4 83.7  PLT 370 391 375 352 365    Medications:     acidophilus  1 capsule Oral QHS   amLODipine  10 mg Oral Daily   artificial tears   Both Eyes BID   aspirin EC  162 mg Oral QHS   budesonide  0.5 mg Nebulization BH-qamhs   dextromethorphan-guaiFENesin  1 tablet Oral BID   diclofenac Sodium  2 g Topical TID   docusate sodium  100 mg Oral BID   feeding supplement (GLUCERNA SHAKE)  237 mL Oral BID BM   furosemide  40 mg Oral Daily   heparin  5,000 Units Subcutaneous Q8H   insulin aspart  0-20 Units Subcutaneous TID WC   loratadine  10 mg Oral QHS   losartan  50 mg Oral Daily   pantoprazole  40 mg Oral Daily   polyethylene glycol  17 g Oral BID   polyvinyl alcohol  2 drop Both Eyes Daily   senna  1 tablet Oral BID   sodium chloride flush  5 mL Intracatheter Q8H    Elmarie Shiley, MD 11/08/2022, 8:41 AM

## 2022-11-09 ENCOUNTER — Inpatient Hospital Stay (HOSPITAL_COMMUNITY): Payer: Medicare PPO

## 2022-11-09 DIAGNOSIS — I350 Nonrheumatic aortic (valve) stenosis: Secondary | ICD-10-CM | POA: Diagnosis not present

## 2022-11-09 DIAGNOSIS — I5032 Chronic diastolic (congestive) heart failure: Secondary | ICD-10-CM | POA: Diagnosis not present

## 2022-11-09 DIAGNOSIS — M009 Pyogenic arthritis, unspecified: Secondary | ICD-10-CM | POA: Diagnosis not present

## 2022-11-09 LAB — ECHOCARDIOGRAM COMPLETE
AR max vel: 0.91 cm2
AV Area VTI: 0.94 cm2
AV Area mean vel: 0.92 cm2
AV Mean grad: 64 mmHg
AV Peak grad: 91.6 mmHg
Ao pk vel: 4.79 m/s
Calc EF: 76.9 %
Height: 64 in
MV M vel: 4.06 m/s
MV Peak grad: 65.9 mmHg
MV VTI: 1.87 cm2
S' Lateral: 2.6 cm
Single Plane A2C EF: 74.3 %
Single Plane A4C EF: 78.7 %
Weight: 3051.2 oz

## 2022-11-09 LAB — MISC LABCORP TEST (SEND OUT): Labcorp test code: 96388

## 2022-11-09 LAB — RENAL FUNCTION PANEL
Albumin: 2.1 g/dL — ABNORMAL LOW (ref 3.5–5.0)
Anion gap: 13 (ref 5–15)
BUN: 11 mg/dL (ref 8–23)
CO2: 26 mmol/L (ref 22–32)
Calcium: 8.8 mg/dL — ABNORMAL LOW (ref 8.9–10.3)
Chloride: 85 mmol/L — ABNORMAL LOW (ref 98–111)
Creatinine, Ser: 0.66 mg/dL (ref 0.44–1.00)
GFR, Estimated: >60 mL/min (ref >60)
Glucose, Bld: 184 mg/dL — ABNORMAL HIGH (ref 70–99)
Phosphorus: 2.6 mg/dL (ref 2.5–4.6)
Potassium: 4.2 mmol/L (ref 3.5–5.1)
Sodium: 124 mmol/L — ABNORMAL LOW (ref 135–145)

## 2022-11-09 LAB — CULTURE, BLOOD (ROUTINE X 2): Culture: NO GROWTH

## 2022-11-09 LAB — GLUCOSE, CAPILLARY
Glucose-Capillary: 195 mg/dL — ABNORMAL HIGH (ref 70–99)
Glucose-Capillary: 296 mg/dL — ABNORMAL HIGH (ref 70–99)
Glucose-Capillary: 328 mg/dL — ABNORMAL HIGH (ref 70–99)
Glucose-Capillary: 240 mg/dL — ABNORMAL HIGH (ref 70–99)
Glucose-Capillary: 262 mg/dL — ABNORMAL HIGH (ref 70–99)
Glucose-Capillary: 180 mg/dL — ABNORMAL HIGH (ref 70–99)

## 2022-11-09 MED ORDER — TOLVAPTAN 15 MG PO TABS
15.0000 mg | ORAL_TABLET | Freq: Once | ORAL | Status: AC
  Administered 2022-11-09: 15 mg via ORAL
  Filled 2022-11-09: qty 1

## 2022-11-09 MED ORDER — PERFLUTREN LIPID MICROSPHERE
1.0000 mL | INTRAVENOUS | Status: AC | PRN
  Administered 2022-11-09: 2 mL via INTRAVENOUS

## 2022-11-09 MED ORDER — INSULIN DETEMIR 100 UNIT/ML ~~LOC~~ SOLN
5.0000 [IU] | Freq: Every day | SUBCUTANEOUS | Status: DC
  Administered 2022-11-09 – 2022-11-17 (×9): 5 [IU] via SUBCUTANEOUS
  Filled 2022-11-09 (×9): qty 0.05

## 2022-11-09 NOTE — Inpatient Diabetes Management (Signed)
Inpatient Diabetes Program Recommendations  AACE/ADA: New Consensus Statement on Inpatient Glycemic Control (2015)  Target Ranges:  Prepandial:   less than 140 mg/dL      Peak postprandial:   less than 180 mg/dL (1-2 hours)      Critically ill patients:  140 - 180 mg/dL    Latest Reference Range & Units 11/08/22 07:56 11/08/22 13:39 11/08/22 16:01 11/08/22 21:54  Glucose-Capillary 70 - 99 mg/dL 181 (H)  4 units Novolog  255 (H)  11 units Novolog  199 (H)  4 units Novolog  210 (H)  (H): Data is abnormally high  Latest Reference Range & Units 11/09/22 08:13  Glucose-Capillary 70 - 99 mg/dL 195 (H)  4 units Novolog   (H): Data is abnormally high     Home DM Meds: Metformin 500 mg BID        Levemir 5 units Daily        Humalog 0-12 units TID per SSI   Current Orders: Novolog Resistant Correction Scale/ SSI (0-20 units) TID AC     MD- Note AM CBGs have been >150  Please consider starting Semglee 5 units Daily    --Will follow patient during hospitalization--  Wyn Quaker RN, MSN, [place] Diabetes Coordinator Inpatient Glycemic Control Team Team Pager: [phone] (8a-5p)

## 2022-11-09 NOTE — Progress Notes (Addendum)
Patient ID: Heidi Dominguez, female   DOB: February 02, 1921, 89 y.o.   MRN: [id number] [facility] Progress Note   Assessment/ Plan:   1.  Hyponatremia: Acute on chronic hypoosmolar hyponatremia in slightly hypervolemic patient.  Likely SIADH with superimposed non-osmolar triggers for ADH release.  Sodium level lower this morning, will repeat tolvaptan and continue fluid restriction.  She remains euvolemic and without any neurological symptoms to prompt consideration of hypertonic saline.  Will stop losartan at this time to allow for correction of hyponatremia. 2.  Septic arthritis of right shoulder: Abscess drainage revealed E. coli infection for which she is currently on ceftriaxone based on sensitivity panel.  Seen by orthopedic surgery with no additional interventions planned. 3.  Chronic diastolic heart failure: Mild/trace lower extremity edema is present for which we will continue loop diuretic and oral fluid restriction. 4.  Hypertension: Blood pressures marginally elevated but acceptable range on combination of amlodipine, losartan and furosemide.  Subjective:   Reports to be without any complaints at this time, reports improving appetite.   Objective:   BP 135/74 (BP Location: Right Arm)   Pulse 87   Temp 98.2 F (36.8 C) (Oral)   Resp 18   Ht 5\' 4"  (1.626 m)   Wt 86.5 kg   SpO2 98%   BMI 32.73 kg/m   Intake/Output Summary (Last 24 hours) at 11/09/2022 0835 Last data filed at 11/09/2022 0100 Gross per 24 hour  Intake 1216.22 ml  Output 48 ml  Net 1168.22 ml   Weight change:   Physical Exam: Gen: Comfortably resting in bed, eating her breakfast CVS: Pulse regular rhythm, normal rate, S1 and S2 normal Resp: Breath sounds bilaterally, no rales/rhonchi Abd: Soft, obese, nontender, bowel sounds normal Ext: Trace-1+ bilateral lower extremity edema.  Right shoulder drains present  Imaging: No results found.  Labs: BMET Recent Labs  Lab 11/04/22 0135  11/05/22 0443 11/06/22 0440 11/07/22 0333 11/07/22 1508 11/08/22 0350 11/08/22 1630 11/09/22 0039  NA 125* 127* 124* 124* 124* 125* 126* 124*  K 5.1 4.9 4.6 4.6 4.1 4.1  --  4.2  CL 86* 90* 91* 88* 86* 86*  --  85*  CO2 31 29 26 27 28 30   --  26  GLUCOSE 151* 121* 160* 138* 184* 157*  --  184*  BUN 15 18 13 9 8 10   --  11  CREATININE 0.79 0.79 0.60 0.67 0.68 0.62  --  0.66  CALCIUM 8.7* 8.3* 8.2* 8.4* 8.5* 8.5*  --  8.8*  PHOS  --   --   --   --   --  2.9  --  2.6   CBC Recent Labs  Lab 11/03/22 1136 11/05/22 0443 11/06/22 0440 11/07/22 0333 11/08/22 0350  WBC 15.5* 12.8* 12.6* 13.9* 13.0*  NEUTROABS 13.1*  --   --   --   --   HGB 11.2* 10.0* 10.0* 10.2* 9.5*  HCT 34.3* 31.0* 31.7* 31.6* 28.7*  MCV 84.7 84.7 85.7 85.4 83.7  PLT 370 391 375 352 365    Medications:     acidophilus  1 capsule Oral QHS   amLODipine  10 mg Oral Daily   artificial tears   Both Eyes BID   aspirin EC  162 mg Oral QHS   budesonide  0.5 mg Nebulization BH-qamhs   dextromethorphan-guaiFENesin  1 tablet Oral BID   diclofenac Sodium  2 g Topical TID   docusate sodium  100 mg Oral BID   feeding supplement (  GLUCERNA SHAKE)  237 mL Oral BID BM   furosemide  40 mg Oral Daily   heparin  5,000 Units Subcutaneous Q8H   insulin aspart  0-20 Units Subcutaneous TID WC   loratadine  10 mg Oral QHS   losartan  50 mg Oral Daily   pantoprazole  40 mg Oral Daily   polyethylene glycol  17 g Oral BID   polyvinyl alcohol  2 drop Both Eyes Daily   senna  1 tablet Oral BID   sodium chloride flush  5 mL Intracatheter Q8H    Elmarie Shiley, MD 11/09/2022, 8:35 AM

## 2022-11-09 NOTE — Progress Notes (Signed)
  Echocardiogram 2D Echocardiogram has been performed.  Heidi Dominguez 11/09/2022, 11:00 AM

## 2022-11-09 NOTE — Progress Notes (Signed)
Orthopedics Progress Note  Subjective: Patient reports no right shoulder pain. She has a good appetite   Objective:  Vitals:   11/09/22 0359 11/09/22 0730  BP: 110/73 135/74  Pulse: 87 87  Resp: 16 18  Temp: 98.9 F (37.2 C) 98.2 F (36.8 C)  SpO2: 93% 98%    General: Awake and alert, eating breakfast Musculoskeletal: Right shoulder non swollen and non tender. Pain free AROM of the shoulder for basic ADLs, distally NVI  Neurovascularly intact  Lab Results  Component Value Date   WBC 13.0 (H) 11/08/2022   HGB 9.5 (L) 11/08/2022   HCT 28.7 (L) 11/08/2022   MCV 83.7 11/08/2022   PLT 365 11/08/2022       Component Value Date/Time   NA 124 (L) 11/09/2022 0039   K 4.2 11/09/2022 0039   CL 85 (L) 11/09/2022 0039   CO2 26 11/09/2022 0039   GLUCOSE 184 (H) 11/09/2022 0039   BUN 11 11/09/2022 0039   CREATININE 0.66 11/09/2022 0039   CREATININE 0.81 02/09/2021 1641   CALCIUM 8.8 (L) 11/09/2022 0039   GFRNONAA >60 11/09/2022 0039   GFRNONAA 39 (L) 01/12/2021 0000   GFRAA 45 (L) 01/12/2021 0000    Lab Results  Component Value Date   INR CANCELED 03/25/2021   INR 0.98 08/07/2012   INR 1.04 02/26/2012    Assessment/Plan: 89 yo with Right shoulder hemiarthroplasty with chronic recurrent E Coli infection  S/p IR drainage of right shoulder and positive cultures for E coli. Patient had previously been on suppressive Abx with Cipro.  Due to medical comorbidities and high periop risk, agree with current plan for discontinuing drains once output minimal. Continue IV abx per Infectious Disease service. Clinically she is much improved and functional with the shoulder.  Surgical open debridement and bead placement would ONLY be a last resort Please call us for any changes in her clinical condition  Doran Heater. Veverly Fells, MD 11/09/2022 8:27 AM

## 2022-11-09 NOTE — Progress Notes (Signed)
Referring Physician(s): Gilberto Better  Supervising Physician: Markus Daft  Patient Status:  [facility] - In-pt  Chief Complaint:  Periprosthetic fluid collection s/p drain placement x2 11/04/22   Subjective:  Patient sitting upright in bed at time of exam. She is alert and states she has only minor discomfort in her right shoulder/upper arm.   Allergies: Augmentin [amoxicillin-pot clavulanate], Bactrim [sulfamethoxazole-trimethoprim], Calan [verapamil], Catapres [clonidine hcl], Codeine, Hydrocodone, Other, Sulfa antibiotics, Zestril [lisinopril], and Azulfidine [sulfasalazine]  Medications: Prior to Admission medications   Medication Sig Start Date End Date Taking? Authorizing Provider  acetaminophen (TYLENOL) 325 MG tablet Take 650 mg by mouth 3 (three) times daily.   Yes [provider]  albuterol (VENTOLIN HFA) 108 (90 Base) MCG/ACT inhaler Inhale 2 puffs into the lungs every 6 (six) hours as needed for wheezing or shortness of breath.   Yes [provider]  amLODipine (NORVASC) 10 MG tablet Take 10 mg by mouth daily. HOLD if SBP < 110.   Yes [provider]  aspirin EC 81 MG tablet Take 162 mg by mouth at bedtime.   Yes [provider]  budesonide (PULMICORT) 0.5 MG/2ML nebulizer solution Take 2 mLs (0.5 mg total) by nebulization in the morning and at bedtime. 10/16/22  Yes Cobb, Karie Schwalbe, NP  Cholecalciferol (VITAMIN D3) 25 MCG (1000 UT) capsule Take 1,000 Units by mouth every Monday.   Yes [provider]  Cinnamon 500 MG capsule Take 500 mg by mouth daily.   Yes [provider]  dextromethorphan-guaiFENesin (MUCINEX DM) 30-600 MG 12hr tablet Take 1 tablet by mouth 2 (two) times daily. 10/16/22  Yes Cobb, Karie Schwalbe, NP  diclofenac Sodium (VOLTAREN ARTHRITIS PAIN) 1 % GEL Apply 2 g topically 3 (three) times daily. To both shoulders.   Yes [provider]  furosemide (LASIX) 20 MG tablet Take 0.5 tablets (10 mg total) by  mouth daily for 10 days. Patient taking differently: Take 20 mg by mouth daily. 10/26/22 11/05/22 Yes Tacy Learn, PA-C  hypromellose (GENTEAL SEVERE) 0.3 % GEL ophthalmic ointment Place 1 Application into both eyes 2 (two) times daily.   Yes [provider]  insulin lispro (HUMALOG) 100 UNIT/ML injection Inject 0-12 Units into the skin 3 (three) times daily before meals. Per sliding scale: If blood sugar is less than 70, call NP/PA. If blood sugar is 70 to 200, give 0 units. If blood sugar is 201 to 250, give 2 units. If blood sugar is 251 to 300, give 4 units. If blood sugar is 301 to 350, give 6 units. If blood sugar is 351 to 400, give 8 units. If blood sugar is 401 to 450, give 10 units. If blood sugar is 451 to 600, give 12 units. Recheck in 2 hours.   Yes [provider]  ipratropium-albuterol (DUONEB) 0.5-2.5 (3) MG/3ML SOLN Take 3 mLs by nebulization 2 (two) times daily. 09/19/22  Yes [provider]  Lactobacillus (PROBIOTIC ACIDOPHILUS) CAPS Take 1 capsule by mouth at bedtime.   Yes [provider]  loratadine (CLARITIN) 10 MG tablet Take 10 mg by mouth at bedtime.   Yes [provider]  losartan (COZAAR) 50 MG tablet Take 50 mg by mouth daily. HOLD for SBP < 110.   Yes [provider]  metFORMIN (GLUCOPHAGE) 500 MG tablet Take 0.5 tablets (250 mg total) by mouth 2 (two) times daily with a meal. Patient taking differently: Take 500 mg by mouth 2 (two) times daily with a meal.  10/26/22 11/25/22 Yes Tacy Learn, PA-C  Multiple Vitamins-Minerals (PRESERVISION AREDS) CAPS Take 1 capsule by mouth daily.   Yes [provider]  OMEPRAZOLE PO Take 20 mg by mouth daily. Omeprazole 20 mg DR disintegrating tablet.   Yes [provider]  ondansetron (ZOFRAN) 4 MG tablet Take 4 mg by mouth every 8 (eight) hours as needed for nausea or vomiting.   Yes [provider]  Polyethyl Glycol-Propyl Glycol (LUBRICANT EYE  DROPS) 0.4-0.3 % SOLN Place 2 drops into both eyes daily.   Yes [provider]  polyethylene glycol powder (GLYCOLAX/MIRALAX) 17 GM/SCOOP powder Take 17 g by mouth at bedtime.   Yes [provider]  polyvinyl alcohol (LIQUIFILM TEARS) 1.4 % ophthalmic solution Place 1 drop into both eyes in the morning, at noon, in the evening, and at bedtime. Following injection treatment   Yes [provider]  guaiFENesin (ROBITUSSIN) 100 MG/5ML liquid Take 10 mLs by mouth every 6 (six) hours as needed for cough or to loosen phlegm. Patient not taking: Reported on 11/04/2022 10/01/22   Barb Merino, MD  LEVEMIR FLEXPEN 100 UNIT/ML FlexPen Inject 5 Units into the skin daily. 11/02/22   [provider]     Vital Signs: BP 135/74 (BP Location: Right Arm)   Pulse 87   Temp 98.2 F (36.8 C) (Oral)   Resp 18   Ht 5\' 4"  (1.626 m)   Wt 190 lb 11.2 oz (86.5 kg)   SpO2 98%   BMI 32.73 kg/m   Physical Exam Vitals reviewed.  Constitutional:      General: She is not in acute distress.    Appearance: Normal appearance. She is not ill-appearing.  HENT:     Ears:     Comments: Pt is HOH Pulmonary:     Effort: Pulmonary effort is normal. No respiratory distress.  Skin:    General: Skin is warm and dry.     Comments: Medial drain flushes/aspirates easily. Site unremarkable with sutures/statlock in place. ~ 25 cc  pink colored OP in JP. Dressing C/D/I.  Lateral drain flushes/aspirates easily. Site unremarkable with sutures/statlock in place. ~ 25 cc pink OP in JP. Dressing C/D/I.    Neurological:     Mental Status: She is alert.  Psychiatric:        Mood and Affect: Mood normal.        Behavior: Behavior normal.        Thought Content: Thought content normal.        Judgment: Judgment normal.     Imaging: No results found.  Labs:  CBC: Recent Labs    11/05/22 0443 11/06/22 0440 11/07/22 0333 11/08/22 0350  WBC 12.8* 12.6* 13.9* 13.0*  HGB 10.0* 10.0* 10.2*  9.5*  HCT 31.0* 31.7* 31.6* 28.7*  PLT 391 375 352 365     COAGS: No results for input(s): "INR", "APTT" in the last 8760 hours.  BMP: Recent Labs    11/07/22 0333 11/07/22 1508 11/08/22 0350 11/08/22 1630 11/09/22 0039  NA 124* 124* 125* 126* 124*  K 4.6 4.1 4.1  --  4.2  CL 88* 86* 86*  --  85*  CO2 27 28 30   --  26  GLUCOSE 138* 184* 157*  --  184*  BUN 9 8 10   --  11  CALCIUM 8.4* 8.5* 8.5*  --  8.8*  CREATININE 0.67 0.68 0.62  --  0.66  GFRNONAA >60 >60 >60  --  >60  LIVER FUNCTION TESTS: Recent Labs    11/05/22 0443 11/06/22 0440 11/07/22 0333 11/08/22 0350 11/09/22 0039  BILITOT 0.8 0.6 0.5 0.3  --   AST 13* 14* 14* 14*  --   ALT 11 12 11 12   --   ALKPHOS 65 65 62 63  --   PROT 5.8* 5.8* 5.7* 5.5*  --   ALBUMIN 1.7* 1.7* 1.7* 1.9* 2.1*     Assessment and Plan:  89 yo female s/p right shoulder fluid collection drain placement x 2 .  WBC 13.0 on 3/27 (13.9 on 3/26) Afebrile, VSS  Preliminary Cx: FEW ESCHERICHIA COLI  CONTINUING TO HOLD  PER DR VU HOLD FOR 14 DAYS FOR P ACNES   Plan:  Please assess drain for possible removal prior to d/c.  Continue TID flushes with 5 cc NS. Record output Q shift. Dressing changes QD or PRN if soiled.  Call IR APP or on call IR MD if difficulty flushing or sudden change in drain output.  Repeat imaging/possible drain injection once output < 10 mL/QD (excluding flush material). Consideration for drain removal if output is < 10 mL/QD (excluding flush material), pending discussion with the providing surgical service.   Discharge planning: Please contact IR APP or on call IR MD prior to patient d/c to ensure appropriate follow up plans are in place. Typically patient will follow up with IR clinic 10-14 days post d/c for repeat imaging/possible drain injection. IR scheduler will contact patient with date/time of appointment. Patient will need to flush drain QD with 5 cc NS, record output QD, dressing changes every  2-3 days or earlier if soiled.    IR will continue to follow - please call with questions or concerns.   Electronically Signed: Lura Em, PA-C 11/09/2022, 2:52 PM   I spent a total of 15 Minutes at the the patient's bedside AND on the patient's hospital floor or unit, greater than 50% of which was counseling/coordinating care for right shoulder periprosthetic fluid collection.

## 2022-11-09 NOTE — Progress Notes (Addendum)
[facility] PROGRESS NOTE  Heidi Dominguez (DOB: Dec 18, 1920) [id number] PCP: Lajean Manes, MD  Brief Narrative: Heidi Dominguez is a 89 y.o. female with a history of chronic HFpEF, T2DM, HTN, getting over recent RSV infection, who presented to the ED on 11/03/2022 with right shoulder pain and swelling with leukocytosis, and large fluid collection on CT. This was aspirated with drain placement by IR on 3/23. Cultures have grown E. coli for which ID has been guiding antibiotic therapy. Orthopedics also following. Hospitalization complicated by hyponatremia, SIADH for which nephrology was consulted. Fluid restriction was initiated without significant change, tolvaptan is to be administered 3/27.    Subjective: No new complaints. Pain controlled. Eating breakfast.   Objective: BP 135/74 (BP Location: Right Arm)   Pulse 87   Temp 98.2 F (36.8 C) (Oral)   Resp 18   Ht 5\' 4"  (1.626 m)   Wt 86.5 kg   SpO2 98%   BMI 32.73 kg/m   Gen: No distress eating breakfast Pulm: Clear, nonlabored  CV: RRR, stable holosystolic murmur, 1+ pitting edema GI: Soft, NT, ND, +BS  Neuro: Alert and oriented. No new focal deficits. Ext: Warm, dry. R shoulder edema diffusely with appropriate tenderness to palpation, drains dressings c/d/i Skin: No new rashes, lesions or ulcers on visualized skin   Assessment & Plan: Septic arthritis and abscess of right shoulder, prosthetic joint infection: s/p 40cc purulent aspirated fluid +E. coli on 3/23. She has history of E. coli infection of this joint s/p 2.5 years of suppressive ciprofloxacin completed Sept 2022.  - Orthopedics consulted, very poor surgical candidate in this 89yo. Dr. Veverly Fells recommended, and pt/family consented to, trial of aspiration and antibiotics in lieu of open I&D and bead placement. Fortunately she is stabilized and functionally improving. - IR placed lateral 10Fr drain (labeled #1) w/130cc pink purulent fluid output and more  medial/anterior 8Fr drain (labeled #2) with 40cc yellow purulent fluid output. Will continue TID 5cc NS flushes (can decrease to daily at discharge) and output recording qShift. Plan to inject/remove drain once daily output < 10cc. Dressing changes q2-3 days and prn. F/u in IR drain clinic in 10-14 days. - ID consulted, continued on ceftriaxone until no further culture growth and will be transitioned to cefadroxil 1g po BID thereafter. Will need RCID follow up and indefinite suppression given hardware in place. Note cultures being held x14 days for P. acnes.  - Will require SNF rehabilitation   Hyponatremia, SIADH: Acute on chronic hypoosmolar hyponatremia, asymptomatic. - Nephrology consult appreciated. Resistant to tolvaptan. D/w Dr. Posey Pronto. DC losartan and repeat tolvaptan.  - Continue checking serial Na. - Continue lasix 40mg  daily, may need to increase dose, monitor edema.   Acute on chronic HFpE, HTN: Last Echo 04/09/2015 with EF 60-65%, Grade I DD. She has been managed with HCTZ and low dose lasix. - Stopped HCTZ. Giving lasix 40mg  daily.  - Continue BP management with norvasc.  - Pt has considerable murmur and only mild AS on last echo in 2016. Repeat echo pending.   Controlled T2DM: HbA1c 7.5%.  - Hold metformin while admitted - Continue SSI - Add back basal insulin 5u daily - On statin, ASA (home dose is 162 qHS)   Nausea and vomiting: Only XR abnormality is constipation.  - Continue daily bowel regimen, had BM 3/26 per pt after suppository. Abd exam benign, eating well with resolved N/V.   Recent RSV infection:  - Continue inhaled therapies, no hypoxia.   GERD:  - Continue  PPI  Patrecia Pour, MD [facility] [email or web address] 11/09/2022, 9:18 AM

## 2022-11-09 NOTE — Progress Notes (Signed)
Physical Therapy Treatment Patient Details Name: Heidi Dominguez MRN: [id number] DOB: 1921/01/22 Today's Date: 11/09/2022   History of Present Illness Heidi Dominguez is a 89 y.o. female admitted on 11/03/2022 with shoulder infection. 2x drains placed. PMHx: arthritis, CHF, CKD, Dyspnea, GERD, HOH, HTN, OA, bilateral shoulder replacements, bil TKR    PT Comments    Continuing to work on basic mobility with pt. Family ultimately would like to take pt home after some post acute rehab. Family will need to plan to assist her with mobility at time of her return home and likely will need some equipment. Pt's knees lack necessary flexion to bring feet all the way underneath her when coming to stand (per prior encounter it appears pt had a lift chair at home) so will need to work from elevated surfaces.    Recommendations for follow up therapy are one component of a multi-disciplinary discharge planning process, led by the attending physician.  Recommendations may be updated based on patient status, additional functional criteria and insurance authorization.  Follow Up Recommendations  Can patient physically be transported by private vehicle: No    Assistance Recommended at Discharge Frequent or constant Supervision/Assistance  Patient can return home with the following Two people to help with walking and/or transfers;Two people to help with bathing/dressing/bathroom   Equipment Recommendations  Hospital bed;Other (comment) (hoyer lift)    Recommendations for Other Services       Precautions / Restrictions Precautions Precautions: Fall Precaution Comments: 2x drains R shoulder Restrictions Weight Bearing Restrictions: No     Mobility  Bed Mobility Overal bed mobility: Needs Assistance Bed Mobility: Supine to Sit, Sit to Supine Rolling: Mod assist   Supine to sit: Mod assist, HOB elevated Sit to supine: +2 for physical assistance, Mod assist   General bed mobility comments: Assist to  bring legs off of bed, elevate trunk into sitting, and bring hips to EOB. Assist to lower trunk and bring legs back up into bed.    Transfers Overall transfer level: Needs assistance Equipment used: Ambulation equipment used, 2 person hand held assist Transfers: Sit to/from Stand Sit to Stand: Max assist, +2 physical assistance, From elevated surface           General transfer comment: Used bed pad underneath hips to lift hips into partial stand. Pt with trunk flexed forward over bar. Assist to bring trunk into more upright posture. Bed elevated ~ 6". Pt with limited knee flexion making it impossible for her to bring feet underneath her if sitting on unelevated bed. Stood x 3 from bed.    Ambulation/Gait                   Stairs             Wheelchair Mobility    Modified Rankin (Stroke Patients Only)       Balance Overall balance assessment: Needs assistance Sitting-balance support: Feet supported, No upper extremity supported Sitting balance-Leahy Scale: Fair     Standing balance support: During functional activity, Bilateral upper extremity supported, Reliant on assistive device for balance Standing balance-Leahy Scale: Zero Standing balance comment: Primarily required mod assist of 2 to maintain standing in stedy. Pt with feet in front of base of support due to limited knee flexion in sitting and unable to bring feet back once in standing.  Cognition Arousal/Alertness: Awake/alert Behavior During Therapy: WFL for tasks assessed/performed Overall Cognitive Status: No family/caregiver present to determine baseline cognitive functioning                                 General Comments: Pt appears functional and is following commands.        Exercises      General Comments        Pertinent Vitals/Pain Pain Assessment Pain Assessment: No/denies pain    Home Living                           Prior Function            PT Goals (current goals can now be found in the care plan section) Progress towards PT goals: Goals downgraded-see care plan    Frequency    Min 2X/week      PT Plan Discharge plan needs to be updated    Co-evaluation              AM-PAC PT "6 Clicks" Mobility   Outcome Measure  Help needed turning from your back to your side while in a flat bed without using bedrails?: A Lot Help needed moving from lying on your back to sitting on the side of a flat bed without using bedrails?: A Lot Help needed moving to and from a bed to a chair (including a wheelchair)?: Total Help needed standing up from a chair using your arms (e.g., wheelchair or bedside chair)?: Total Help needed to walk in hospital room?: Total Help needed climbing 3-5 steps with a railing? : Total 6 Click Score: 8    End of Session   Activity Tolerance: Patient tolerated treatment well Patient left: in bed;with call bell/phone within reach;with bed alarm set   PT Visit Diagnosis: Other abnormalities of gait and mobility (R26.89);Muscle weakness (generalized) (M62.81)     Time: [id number] PT Time Calculation (min) (ACUTE ONLY): 18 min  Charges:  $Therapeutic Activity: 8-22 mins                     [employer] 11/09/2022, 5:54 PM

## 2022-11-10 ENCOUNTER — Inpatient Hospital Stay (HOSPITAL_COMMUNITY): Payer: Medicare PPO

## 2022-11-10 DIAGNOSIS — M009 Pyogenic arthritis, unspecified: Secondary | ICD-10-CM | POA: Diagnosis not present

## 2022-11-10 LAB — RENAL FUNCTION PANEL
Albumin: 1.9 g/dL — ABNORMAL LOW (ref 3.5–5.0)
Anion gap: 10 (ref 5–15)
BUN: 16 mg/dL (ref 8–23)
CO2: 28 mmol/L (ref 22–32)
Calcium: 8.5 mg/dL — ABNORMAL LOW (ref 8.9–10.3)
Chloride: 88 mmol/L — ABNORMAL LOW (ref 98–111)
Creatinine, Ser: 0.76 mg/dL (ref 0.44–1.00)
GFR, Estimated: >60 mL/min (ref >60)
Glucose, Bld: 107 mg/dL — ABNORMAL HIGH (ref 70–99)
Phosphorus: 3.2 mg/dL (ref 2.5–4.6)
Potassium: 4.3 mmol/L (ref 3.5–5.1)
Sodium: 126 mmol/L — ABNORMAL LOW (ref 135–145)

## 2022-11-10 LAB — MIC RESULT

## 2022-11-10 LAB — GLUCOSE, CAPILLARY
Glucose-Capillary: 145 mg/dL — ABNORMAL HIGH (ref 70–99)
Glucose-Capillary: 288 mg/dL — ABNORMAL HIGH (ref 70–99)
Glucose-Capillary: 205 mg/dL — ABNORMAL HIGH (ref 70–99)
Glucose-Capillary: 192 mg/dL — ABNORMAL HIGH (ref 70–99)

## 2022-11-10 LAB — MINIMUM INHIBITORY CONC. (1 DRUG)

## 2022-11-10 MED ORDER — TOLVAPTAN 15 MG PO TABS
15.0000 mg | ORAL_TABLET | Freq: Once | ORAL | Status: AC
  Administered 2022-11-10: 15 mg via ORAL
  Filled 2022-11-10: qty 1

## 2022-11-10 MED ORDER — INSULIN ASPART 100 UNIT/ML IJ SOLN
3.0000 [IU] | Freq: Three times a day (TID) | INTRAMUSCULAR | Status: DC
  Administered 2022-11-10 – 2022-11-17 (×20): 3 [IU] via SUBCUTANEOUS

## 2022-11-10 MED ORDER — FUROSEMIDE 40 MG PO TABS: 40.0000 mg | ORAL_TABLET | Freq: Every day | ORAL | Status: DC

## 2022-11-10 MED ORDER — CEFADROXIL 500 MG PO CAPS
1000.0000 mg | ORAL_CAPSULE | Freq: Two times a day (BID) | ORAL | Status: DC
  Administered 2022-11-10 – 2022-11-17 (×15): 1000 mg via ORAL
  Filled 2022-11-10 (×15): qty 2

## 2022-11-10 MED ORDER — FUROSEMIDE 10 MG/ML IJ SOLN
40.0000 mg | Freq: Once | INTRAMUSCULAR | Status: AC
  Administered 2022-11-10: 40 mg via INTRAVENOUS
  Filled 2022-11-10: qty 4

## 2022-11-10 NOTE — Progress Notes (Signed)
Inpatient Diabetes Program Recommendations  AACE/ADA: New Consensus Statement on Inpatient Glycemic Control (2015)  Target Ranges:  Prepandial:   less than 140 mg/dL      Peak postprandial:   less than 180 mg/dL (1-2 hours)      Critically ill patients:  140 - 180 mg/dL   Lab Results  Component Value Date   GLUCAP 145 (H) 11/10/2022   HGBA1C 7.5 (H) 10/02/2022    Review of Glycemic Control  Latest Reference Range & Units 11/09/22 08:13 11/09/22 10:55 11/09/22 11:41 11/09/22 15:26 11/09/22 16:40 11/09/22 19:52 11/10/22 07:33  Glucose-Capillary 70 - 99 mg/dL 195 (H) 296 (H) 328 (H) 240 (H) 262 (H) 180 (H) 145 (H)  (H): Data is abnormally high Diabetes history:  DM  Home DM Meds: Metformin 500 mg BID                              Levemir 5 units Daily                              Humalog 0-12 units TID per SSI     Current Orders: Novolog Resistant Correction Scale/ SSI (0-20 units) TID AC                             Levemir 5 units daily                            Novolog 3 units tid with meals    Inpatient Diabetes Program Recommendations:    Consider reducing Novolog correction (0-20 units) to moderate correction (0-15 units)- since meal coverage started today.   Thanks,  Adah Perl, RN, BC-ADM Inpatient Diabetes Coordinator Pager [phone]  (8a-5p)

## 2022-11-10 NOTE — TOC Progression Note (Signed)
Transition of Care [facility]) - Progression Note    Patient Details  Name: Heidi Dominguez MRN: [id number] Date of Birth: 1921/02/21  [facility] CM/SW Contact  [name], [place] Phone Number: 11/10/2022, 1:17 PM  Clinical Narrative:     Pt's Josem Kaufmann has been received and approved for SNF at [facility] CSW received a call from [facility], admissions coordinator stating one medication they do not have in stock. CSW recommended touching base on Monday to see if their pharmacy has it at that time. CSW spoke with pt's dtr to make her aware of auth approval.   Expected Discharge Plan: [place] Barriers to Discharge: Continued Medical Work up  Expected Discharge Plan and Services In-house Referral: Clinical Social Work   Post Acute Care Choice: [place] Living arrangements for the past 2 months: [place] [employer], contracting)                                       Social Determinants of Health (SDOH) Interventions SDOH Screenings   Food Insecurity: No Food Insecurity (11/03/2022)  Housing: Low Risk  (11/03/2022)  Transportation Needs: No Transportation Needs (11/03/2022)  Utilities: Not At Risk (11/03/2022)  Depression (PHQ2-9): Low Risk  (02/09/2021)  Tobacco Use: Low Risk  (11/04/2022)    Readmission Risk Interventions     No data to display

## 2022-11-10 NOTE — Progress Notes (Signed)
[facility]  Date of Admission:  11/03/2022   Total days of inpatient antibiotics Righ shoulder PJI  Principal Problem:   Septic arthritis of shoulder, right (HCC) Active Problems:   Essential hypertension   Diabetes mellitus type 2, controlled (HCC)   Chronic diastolic CHF (congestive heart failure) (HCC)   End of life care   Hyponatremia          Assessment: 89 YF with history of bilateral shoulder arthroplasty, history of right shoulder septic arthroplasty admitted with: # Recurrent right shoulder PJI with abscess SP IR drain placement with Cx+ Ecoli  -CT of right shoulder on 3/22 showed large peripheral enhancing periprosthetic fluid collection around proximal right humerus concerning for abscess. - IR aspirated right shoulder and noted 40 ml purulent fluid, samples sent Cx+ Ecoli -On Vanc and ceftriaxone, Cx held x 14 days.  -Previous right shoulder PJI due to E. coli per documentation previously managed on suppressive Cipro x 2.5 years until  05/04/2021.  Last surgery was I&D and ABX bead placement on 11/08/2018.  Unable to revisedue to poor bone stock and presence of longstem with cables. Recommendations: -D/C Ceftriaxone -Start  cefadroxil 1gm bid x 6 weeks form OR EOT 11/15/22, then transition to cefadroxil 500 mg po bid for indefinite suppression given hardware in place.  -OR cx being held x 14d p acnes,ecoli omada sens pending -F/U iD clinic on 4/   ID will sing off  Microbiology:   Antibiotics: Cefepime 3/22-23 Ceftriaxone 3/24- Vancomycin 3/22- Cultures: Blood 3/22 NG 3/23 NG  SUBJECTIVE: Resting in bed. No new compalints Interval: Afebrile overnight.   Review of Systems: Review of Systems  All other systems reviewed and are negative.    Scheduled Meds:  acidophilus  1 capsule Oral QHS   amLODipine  10 mg Oral Daily   artificial tears   Both Eyes BID   aspirin EC  162 mg Oral QHS   budesonide  0.5 mg Nebulization  BH-qamhs   cefadroxil  1,000 mg Oral BID   dextromethorphan-guaiFENesin  1 tablet Oral BID   diclofenac Sodium  2 g Topical TID   docusate sodium  100 mg Oral BID   feeding supplement (GLUCERNA SHAKE)  237 mL Oral BID BM   furosemide  40 mg Intravenous Once   [START ON 11/11/2022] furosemide  40 mg Oral Daily   heparin  5,000 Units Subcutaneous Q8H   insulin aspart  0-20 Units Subcutaneous TID WC   insulin aspart  3 Units Subcutaneous TID WC   insulin detemir  5 Units Subcutaneous Daily   loratadine  10 mg Oral QHS   pantoprazole  40 mg Oral Daily   polyethylene glycol  17 g Oral BID   polyvinyl alcohol  2 drop Both Eyes Daily   senna  1 tablet Oral BID   sodium chloride flush  5 mL Intracatheter Q8H   Continuous Infusions:   PRN Meds:.acetaminophen **OR** acetaminophen, albuterol, melatonin, morphine injection, naLOXone (NARCAN)  injection, ondansetron (ZOFRAN) IV Allergies  Allergen Reactions   Augmentin [Amoxicillin-Pot Clavulanate] Diarrhea   Bactrim [Sulfamethoxazole-Trimethoprim] Diarrhea and Itching   Calan [Verapamil] Diarrhea   Catapres [Clonidine Hcl] Other (See Comments)    Unknown reaction Not documented on MAR    Codeine Other (See Comments)    "Makes me out of my head, loopy"   Hydrocodone Other (See Comments)    "makes me go out of my head, loopy"   Other Other (  See Comments)    Any type of narcotics Feels "loopy"    Sulfa Antibiotics Itching   Zestril [Lisinopril] Cough   Azulfidine [Sulfasalazine] Itching    OBJECTIVE: Vitals:   11/10/22 0352 11/10/22 0723 11/10/22 0731 11/10/22 0840  BP: (!) 140/59  (!) 123/57   Pulse: 73  80 80  Resp: 17  14 16   Temp: 98.5 F (36.9 C) 98.4 F (36.9 C) 98.4 F (36.9 C)   TempSrc:      SpO2: 100%  95% 95%  Weight:      Height:       Body mass index is 32.73 kg/m.  Physical Exam Constitutional:      Appearance: Normal appearance.  HENT:     Head: Normocephalic and atraumatic.     Right Ear: Tympanic  membrane normal.     Left Ear: Tympanic membrane normal.     Nose: Nose normal.     Mouth/Throat:     Mouth: Mucous membranes are moist.  Eyes:     Extraocular Movements: Extraocular movements intact.     Conjunctiva/sclera: Conjunctivae normal.     Pupils: Pupils are equal, round, and reactive to light.  Cardiovascular:     Rate and Rhythm: Normal rate and regular rhythm.     Heart sounds: No murmur heard.    No friction rub. No gallop.  Pulmonary:     Effort: Pulmonary effort is normal.     Breath sounds: Normal breath sounds.  Abdominal:     General: Abdomen is flat.     Palpations: Abdomen is soft.  Musculoskeletal:     Comments: Right shoulder drain  Skin:    General: Skin is warm and dry.  Neurological:     General: No focal deficit present.     Mental Status: She is alert and oriented to person, place, and time.  Psychiatric:        Mood and Affect: Mood normal.       Lab Results Lab Results  Component Value Date   WBC 13.0 (H) 11/08/2022   HGB 9.5 (L) 11/08/2022   HCT 28.7 (L) 11/08/2022   MCV 83.7 11/08/2022   PLT 365 11/08/2022    Lab Results  Component Value Date   CREATININE 0.76 11/10/2022   BUN 16 11/10/2022   NA 126 (L) 11/10/2022   K 4.3 11/10/2022   CL 88 (L) 11/10/2022   CO2 28 11/10/2022    Lab Results  Component Value Date   ALT 12 11/08/2022   AST 14 (L) 11/08/2022   ALKPHOS 63 11/08/2022   BILITOT 0.3 11/08/2022        [name], [facility] 11/10/2022, 11:57 AM

## 2022-11-10 NOTE — Progress Notes (Signed)
Patient's family called this requesting for MD to assess the patient because she had a tachypnea episode while eating her meal. Reached to on call MD with family concerns. Obtained new order for CXR 1 view. See mar for details.

## 2022-11-10 NOTE — Progress Notes (Signed)
Patient ID: Heidi Dominguez, female   DOB: 13-Apr-1921, 89 y.o.   MRN: [id number] [facility] Progress Note   Assessment/ Plan:   1.  Hyponatremia: Acute on chronic hypoosmolar hyponatremia in slightly hypervolemic patient.  Likely SIADH with superimposed non-osmolar triggers for ADH release.  Slight improvement of sodium level noted on labs this morning after repeating tolvaptan and stopping losartan.  Will redose again with tolvaptan and intravenous furosemide today anticipating sodium level >128 tomorrow morning that should be at the level safe enough to facilitate discharge on furosemide 40 mg daily. 2.  Septic arthritis of right shoulder: Abscess drainage revealed E. coli infection for which she is currently on ceftriaxone based on sensitivity panel.  Seen by orthopedic surgery with no additional interventions planned. 3.  Chronic diastolic heart failure: Mild/trace lower extremity edema is present for which we will continue loop diuretic and oral fluid restriction. 4.  Hypertension: Blood pressures marginally elevated but acceptable range on combination of amlodipine, losartan and furosemide.  Subjective:   Denies any acute events overnight, denies any chest pain or shortness of breath and reports tolerable right shoulder pain.   Objective:   BP (!) 123/57 (BP Location: Right Leg)   Pulse 80   Temp 98.4 F (36.9 C)   Resp 14   Ht 5\' 4"  (1.626 m)   Wt 86.5 kg   SpO2 95%   BMI 32.73 kg/m   Intake/Output Summary (Last 24 hours) at 11/10/2022 0816 Last data filed at 11/10/2022 0600 Gross per 24 hour  Intake 1350 ml  Output 1030 ml  Net 320 ml   Weight change:   Physical Exam: Gen: Comfortably resting in bed, getting ready to eat breakfast CVS: Pulse regular rhythm, normal rate, S1 and S2 normal Resp: Breath sounds bilaterally, no rales/rhonchi Abd: Soft, obese, nontender, bowel sounds normal Ext: Trace-1+ bilateral lower extremity edema.  Right shoulder drains  present  Imaging: ECHOCARDIOGRAM COMPLETE  Result Date: 11/09/2022    ECHOCARDIOGRAM REPORT   Patient Name:   Heidi Dominguez Date of Exam: 11/09/2022 Medical Rec #:  [id number]       Height:       64.0 in Accession #:    [id number]      Weight:       190.7 lb Date of Birth:  09-29-1920       BSA:          1.917 m Patient Age:    45 years        BP:           135/74 mmHg Patient Gender: F               HR:           90 bpm. Exam Location:  Inpatient Procedure: 2D Echo, 3D Echo, Cardiac Doppler, Color Doppler and Intracardiac            Opacification Agent Indications:     CHF/Aortic Stenosis  History:         Patient has prior history of Echocardiogram examinations, most                  recent 04/09/2015. CHF, Signs/Symptoms:Murmur and Dyspnea; Risk                  Factors:Diabetes and Hypertension. CKD.  Sonographer:     Eartha Inch Referring Phys:  [place] Diagnosing Phys: Carlyle Dolly MD  Sonographer Comments: Image acquisition challenging due to  patient body habitus and Image acquisition challenging due to respiratory motion. IMPRESSIONS  1. Left ventricular ejection fraction, by estimation, is 65 to 70%. The left ventricle has normal function. The left ventricle has no regional wall motion abnormalities. There is mild left ventricular hypertrophy. Left ventricular diastolic parameters are consistent with Grade I diastolic dysfunction (impaired relaxation). Elevated left atrial pressure.  2. Right ventricular systolic function is normal. The right ventricular size is normal. There is mildly elevated pulmonary artery systolic pressure.  3. Left atrial size was severely dilated.  4. A small pericardial effusion is present. The pericardial effusion is circumferential. There is no evidence of cardiac tamponade.  5. The mitral valve is abnormal. Trivial mitral valve regurgitation. Moderate mitral stenosis. The mean mitral valve gradient is 6.0 mmHg.HR 87 bpm  6. The tricuspid valve is abnormal.   7. The aortic valve is tricuspid. There is moderate calcification of the aortic valve. There is moderate thickening of the aortic valve. Aortic valve regurgitation is not visualized. Severe aortic valve stenosis. Aortic valve mean gradient measures 64.0  mmHg. Aortic valve peak gradient measures 91.6 mmHg. Aortic valve area, by VTI measures 0.94 cm.  8. The pulmonic valve was abnormal.  9. The inferior vena cava is dilated in size with >50% respiratory variability, suggesting right atrial pressure of 8 mmHg. FINDINGS  Left Ventricle: Left ventricular ejection fraction, by estimation, is 65 to 70%. The left ventricle has normal function. The left ventricle has no regional wall motion abnormalities. Definity contrast agent was given IV to delineate the left ventricular  endocardial borders. The left ventricular internal cavity size was normal in size. There is mild left ventricular hypertrophy. Left ventricular diastolic parameters are consistent with Grade I diastolic dysfunction (impaired relaxation). Elevated left atrial pressure. Right Ventricle: The right ventricular size is normal. Right vetricular wall thickness was not well visualized. Right ventricular systolic function is normal. There is mildly elevated pulmonary artery systolic pressure. The tricuspid regurgitant velocity  is 3.00 m/s, and with an assumed right atrial pressure of 8 mmHg, the estimated right ventricular systolic pressure is 123XX123 mmHg. Left Atrium: Left atrial size was severely dilated. Right Atrium: Right atrial size was normal in size. Pericardium: A small pericardial effusion is present. The pericardial effusion is circumferential. There is no evidence of cardiac tamponade. Mitral Valve: The mitral valve is abnormal. Trivial mitral valve regurgitation. Moderate mitral valve stenosis. MV peak gradient, 16.9 mmHg. The mean mitral valve gradient is 6.0 mmHg. Tricuspid Valve: The tricuspid valve is abnormal. Tricuspid valve regurgitation is  mild . No evidence of tricuspid stenosis. Aortic Valve: The aortic valve is tricuspid. There is moderate calcification of the aortic valve. There is moderate thickening of the aortic valve. There is moderate aortic valve annular calcification. Aortic valve regurgitation is not visualized. Severe  aortic stenosis is present. Aortic valve mean gradient measures 64.0 mmHg. Aortic valve peak gradient measures 91.6 mmHg. Aortic valve area, by VTI measures 0.94 cm. Pulmonic Valve: The pulmonic valve was abnormal. Pulmonic valve regurgitation is mild. No evidence of pulmonic stenosis. Aorta: The aortic root and ascending aorta are structurally normal, with no evidence of dilitation. Venous: The inferior vena cava is dilated in size with greater than 50% respiratory variability, suggesting right atrial pressure of 8 mmHg. IAS/Shunts: No atrial level shunt detected by color flow Doppler.  LEFT VENTRICLE PLAX 2D LVIDd:         3.60 cm     Diastology LVIDs:  2.60 cm     LV e' medial:    4.57 cm/s LV PW:         1.10 cm     LV E/e' medial:  27.1 LV IVS:        1.10 cm     LV e' lateral:   4.46 cm/s LVOT diam:     2.00 cm     LV E/e' lateral: 27.8 LV SV:         92 LV SV Index:   48 LVOT Area:     3.14 cm  LV Volumes (MOD) LV vol d, MOD A2C: 72.9 ml LV vol d, MOD A4C: 82.1 ml LV vol s, MOD A2C: 18.7 ml LV vol s, MOD A4C: 17.5 ml LV SV MOD A2C:     54.2 ml LV SV MOD A4C:     82.1 ml LV SV MOD BP:      59.9 ml RIGHT VENTRICLE             IVC RV S prime:     13.90 cm/s  IVC diam: 2.30 cm TAPSE (M-mode): 1.5 cm LEFT ATRIUM              Index        RIGHT ATRIUM           Index LA diam:        4.70 cm  2.45 cm/m   RA Area:     20.90 cm LA Vol (A2C):   135.0 ml 70.42 ml/m  RA Volume:   64.70 ml  33.75 ml/m LA Vol (A4C):   103.0 ml 53.72 ml/m LA Biplane Vol: 121.0 ml 63.11 ml/m  AORTIC VALVE                     PULMONIC VALVE AV Area (Vmax):    0.91 cm      PR End Diast Vel: 5.76 msec AV Area (Vmean):   0.92 cm AV Area  (VTI):     0.94 cm AV Vmax:           478.50 cm/s AV Vmean:          363.500 cm/s AV VTI:            0.987 m AV Peak Grad:      91.6 mmHg AV Mean Grad:      64.0 mmHg LVOT Vmax:         139.00 cm/s LVOT Vmean:        107.000 cm/s LVOT VTI:          0.294 m LVOT/AV VTI ratio: 0.30  AORTA Ao Root diam: 2.90 cm Ao Asc diam:  3.40 cm MITRAL VALVE                TRICUSPID VALVE MV Area VTI:  1.87 cm      TR Peak grad:   36.0 mmHg MV Peak grad: 16.9 mmHg     TR Mean grad:   27.0 mmHg MV Mean grad: 6.0 mmHg      TR Vmax:        300.00 cm/s MV Vmax:      2.05 m/s      TR Vmean:       255.0 cm/s MV Vmean:     114.5 cm/s MR Peak grad: 65.9 mmHg     SHUNTS MR Vmax:      406.00 cm/s   Systemic VTI:  0.29 m MV E velocity:  124.00 cm/s  Systemic Diam: 2.00 cm Carlyle Dolly MD Electronically signed by Carlyle Dolly MD Signature Date/Time: 11/09/2022/3:03:42 PM    Final (Updated)     Labs: BMET Recent Labs  Lab 11/05/22 [id number] 11/06/22 0440 11/07/22 [id number] 11/07/22 1508 11/08/22 0350 11/08/22 1630 11/09/22 0039 11/10/22 0340  NA 127* 124* 124* 124* 125* 126* 124* 126*  K 4.9 4.6 4.6 4.1 4.1  --  4.2 4.3  CL 90* 91* 88* 86* 86*  --  85* 88*  CO2 29 26 27 28 30   --  26 28  GLUCOSE 121* 160* 138* 184* 157*  --  184* 107*  BUN 18 13 9 8 10   --  11 16  CREATININE 0.79 0.60 0.67 0.68 0.62  --  0.66 0.76  CALCIUM 8.3* 8.2* 8.4* 8.5* 8.5*  --  8.8* 8.5*  PHOS  --   --   --   --  2.9  --  2.6 3.2   CBC Recent Labs  Lab 11/03/22 1136 11/05/22 0443 11/06/22 0440 11/07/22 0333 11/08/22 0350  WBC 15.5* 12.8* 12.6* 13.9* 13.0*  NEUTROABS 13.1*  --   --   --   --   HGB 11.2* 10.0* 10.0* 10.2* 9.5*  HCT 34.3* 31.0* 31.7* 31.6* 28.7*  MCV 84.7 84.7 85.7 85.4 83.7  PLT 370 391 375 352 365    Medications:     acidophilus  1 capsule Oral QHS   amLODipine  10 mg Oral Daily   artificial tears   Both Eyes BID   aspirin EC  162 mg Oral QHS   budesonide  0.5 mg Nebulization BH-qamhs    dextromethorphan-guaiFENesin  1 tablet Oral BID   diclofenac Sodium  2 g Topical TID   docusate sodium  100 mg Oral BID   feeding supplement (GLUCERNA SHAKE)  237 mL Oral BID BM   furosemide  40 mg Oral Daily   heparin  5,000 Units Subcutaneous Q8H   insulin aspart  0-20 Units Subcutaneous TID WC   insulin aspart  3 Units Subcutaneous TID WC   insulin detemir  5 Units Subcutaneous Daily   loratadine  10 mg Oral QHS   pantoprazole  40 mg Oral Daily   polyethylene glycol  17 g Oral BID   polyvinyl alcohol  2 drop Both Eyes Daily   senna  1 tablet Oral BID   sodium chloride flush  5 mL Intracatheter Q8H   tolvaptan  15 mg Oral Once    Elmarie Shiley, MD 11/10/2022, 8:16 AM

## 2022-11-10 NOTE — Progress Notes (Signed)
[facility] PROGRESS NOTE  Heidi Dominguez (DOB: 1921-07-26) [id number] PCP: Lajean Manes, MD  Brief Narrative: Heidi Dominguez is a 89 y.o. female with a history of chronic HFpEF, T2DM, HTN, getting over recent RSV infection, who presented to the ED on 11/03/2022 with right shoulder pain and swelling with leukocytosis, and large fluid collection on CT. This was aspirated with drain placement by IR on 3/23. Cultures have grown E. coli for which ID has been guiding antibiotic therapy. Orthopedics also following. Hospitalization complicated by hyponatremia, SIADH for which nephrology was consulted. Fluid restriction was initiated without significant change, tolvaptan is to be administered 3/27.    Subjective: Has not eaten breakfast yet, denies any chest pain, dyspnea. Pain is controlled.   Objective: BP (!) 123/57 (BP Location: Right Leg)   Pulse 80   Temp 98.4 F (36.9 C)   Resp 16   Ht 5\' 4"  (1.626 m)   Wt 86.5 kg   SpO2 95%   BMI 32.73 kg/m   Gen: No distress, elderly  Pulm: Diminished, clear, nonlabored  CV: RRR, III/VI holosystolic murmur greatest at LUSB, stable, 1+ pitting edema GI: Soft, NT, ND, +BS  Neuro: Alert and oriented. No new focal deficits. Ext: Warm, no deformities. Minimal R shoulder ROM, drains sites appear normal without exudate.  Skin: No new rashes, lesions or ulcers on visualized skin   Assessment & Plan: Septic arthritis and abscess of right shoulder, prosthetic joint infection: s/p 40cc purulent aspirated fluid +E. coli on 3/23. She has history of E. coli infection of this joint s/p 2.5 years of suppressive ciprofloxacin completed Sept 2022. E. coli isolate here is resistant to cipro.  - Orthopedics consulted, very poor surgical candidate in this 89yo. Dr. Veverly Fells recommended, and pt/family consented to, trial of aspiration and antibiotics in lieu of open I&D and bead placement. Fortunately she is stabilized and functionally improving. - IR placed  lateral 10Fr drain (labeled #1) w/130cc pink purulent fluid output and more medial/anterior 8Fr drain (labeled #2) with 40cc yellow purulent fluid output. Will continue TID 5cc NS flushes (can decrease to daily at discharge) and output recording qShift. Plan to inject/remove drain once daily output < 10cc. Dressing changes q2-3 days and prn. F/u in IR drain clinic in 10-14 days. - ID consulted, continued on ceftriaxone until no further culture growth and will be transitioned to cefadroxil 1g po BID thereafter. Will need RCID follow up and indefinite suppression given hardware in place. Note cultures being held x14 days for P. acnes.    Hyponatremia, SIADH: Acute on chronic hypoosmolar hyponatremia, asymptomatic. - Nephrology consult appreciated. D/w Dr. Posey Pronto again today, will augment lasix, repeat tolvaptan, and continue holding ARB today. Criteria for discharge is Na 128. - Continue checking serial Na. - Continue lasix 40mg  daily after discharge.   Acute on chronic HFpE, HTN: Last Echo 04/09/2015 with EF 60-65%, Grade I DD. She has been managed with HCTZ and low dose lasix. - Stopped HCTZ. Giving lasix. - Continue BP management with norvasc.   Aortic stenosis: Severe by echo 3/28 (mean gradient 2mmHg, peak 91.44mmHg, area by VTI 0.94cm2).  - Pt would not wish to under go valve surgery, so will not make referral/consult at this time.    Controlled T2DM: HbA1c 7.5%.  - Hold metformin while admitted - Having postprandial hyperglycemia. Continue home 5u basal insulin, add mealtime insulin today to SSI. Pt's BMI is 32, so some resistance is anticipated.   - On statin, ASA (home dose is 162  qHS)   Nausea and vomiting: Only XR abnormality is constipation.  - Continue daily bowel regimen, had BM 3/26 per pt after suppository. Abd exam benign, eating well with resolved N/V.   Recent RSV infection:  - Continue inhaled therapies, no hypoxia.   GERD:  - Continue PPI  Patrecia Pour, MD [facility] [email or web address] 11/10/2022, 12:02 PM

## 2022-11-10 NOTE — Progress Notes (Signed)
Patient's respirations decreased from 30 to 25 after PRN albuterol given. Patient has expiratory wheezes which is synonymous with AM assessment. Patient does not voice any concerns at this time and appears to be resting comfortably in bed. Provider made aware of patient's vital signs via secure chat-awaiting response. Oncoming nurse also made aware of patient's vital signs-confirmed understanding. Bed lowered and call bell within reach.

## 2022-11-11 ENCOUNTER — Inpatient Hospital Stay (HOSPITAL_COMMUNITY): Payer: Medicare PPO

## 2022-11-11 DIAGNOSIS — I5032 Chronic diastolic (congestive) heart failure: Secondary | ICD-10-CM

## 2022-11-11 DIAGNOSIS — E1165 Type 2 diabetes mellitus with hyperglycemia: Secondary | ICD-10-CM

## 2022-11-11 DIAGNOSIS — M009 Pyogenic arthritis, unspecified: Secondary | ICD-10-CM | POA: Diagnosis not present

## 2022-11-11 DIAGNOSIS — E871 Hypo-osmolality and hyponatremia: Secondary | ICD-10-CM | POA: Diagnosis not present

## 2022-11-11 LAB — RENAL FUNCTION PANEL
Albumin: 1.9 g/dL — ABNORMAL LOW (ref 3.5–5.0)
Anion gap: 11 (ref 5–15)
BUN: 22 mg/dL (ref 8–23)
CO2: 29 mmol/L (ref 22–32)
Calcium: 8.9 mg/dL (ref 8.9–10.3)
Chloride: 86 mmol/L — ABNORMAL LOW (ref 98–111)
Creatinine, Ser: 0.88 mg/dL (ref 0.44–1.00)
GFR, Estimated: 59 mL/min — ABNORMAL LOW (ref >60)
Glucose, Bld: 175 mg/dL — ABNORMAL HIGH (ref 70–99)
Phosphorus: 4.3 mg/dL (ref 2.5–4.6)
Potassium: 4.6 mmol/L (ref 3.5–5.1)
Sodium: 126 mmol/L — ABNORMAL LOW (ref 135–145)

## 2022-11-11 LAB — GLUCOSE, CAPILLARY
Glucose-Capillary: 197 mg/dL — ABNORMAL HIGH (ref 70–99)
Glucose-Capillary: 285 mg/dL — ABNORMAL HIGH (ref 70–99)
Glucose-Capillary: 172 mg/dL — ABNORMAL HIGH (ref 70–99)
Glucose-Capillary: 138 mg/dL — ABNORMAL HIGH (ref 70–99)

## 2022-11-11 MED ORDER — TOLVAPTAN 15 MG PO TABS
15.0000 mg | ORAL_TABLET | Freq: Once | ORAL | Status: AC
  Administered 2022-11-11: 15 mg via ORAL
  Filled 2022-11-11: qty 1

## 2022-11-11 MED ORDER — CARBAMIDE PEROXIDE 6.5 % OT SOLN
5.0000 [drp] | Freq: Two times a day (BID) | OTIC | Status: DC
  Administered 2022-11-11 – 2022-11-17 (×13): 5 [drp] via OTIC
  Filled 2022-11-11: qty 15

## 2022-11-11 MED ORDER — IPRATROPIUM-ALBUTEROL 0.5-2.5 (3) MG/3ML IN SOLN
3.0000 mL | Freq: Three times a day (TID) | RESPIRATORY_TRACT | Status: DC
  Administered 2022-11-12 – 2022-11-13 (×4): 3 mL via RESPIRATORY_TRACT
  Filled 2022-11-11 (×4): qty 3

## 2022-11-11 MED ORDER — FUROSEMIDE 10 MG/ML IJ SOLN: 40.0000 mg | Freq: Once | INTRAMUSCULAR | Status: DC

## 2022-11-11 MED ORDER — FUROSEMIDE 10 MG/ML IJ SOLN
40.0000 mg | Freq: Once | INTRAMUSCULAR | Status: AC
  Administered 2022-11-11: 40 mg via INTRAVENOUS

## 2022-11-11 MED ORDER — FUROSEMIDE 40 MG PO TABS: 40.0000 mg | ORAL_TABLET | Freq: Every day | ORAL | Status: DC

## 2022-11-11 MED ORDER — ALBUMIN HUMAN 25 % IV SOLN
12.5000 g | Freq: Once | INTRAVENOUS | Status: AC
  Administered 2022-11-11: 12.5 g via INTRAVENOUS

## 2022-11-11 MED ORDER — ALBUMIN HUMAN 25 % IV SOLN
12.5000 g | Freq: Once | INTRAVENOUS | Status: DC
  Filled 2022-11-11: qty 50

## 2022-11-11 MED ORDER — FUROSEMIDE 10 MG/ML IJ SOLN
40.0000 mg | Freq: Once | INTRAMUSCULAR | Status: AC
  Administered 2022-11-11: 40 mg via INTRAVENOUS
  Filled 2022-11-11: qty 4

## 2022-11-11 MED ORDER — FUROSEMIDE 10 MG/ML IJ SOLN
40.0000 mg | Freq: Once | INTRAMUSCULAR | Status: DC
  Filled 2022-11-11: qty 4

## 2022-11-11 NOTE — Progress Notes (Signed)
  Per Dr. Anselm Pancoast, drains need to be removed prior to patient's discharge from the hospital.  Please reach out to IR PA prior to patient's discharge so we can remove drains.  [place] PA-C 11/11/2022 10:56 AM

## 2022-11-11 NOTE — Progress Notes (Signed)
Patient A &Ox 4 this shift. With expiratory wheezing this a.m relieved by scheduled nebulizing treatment. Patient with nausea/vomiting x 2 this shift. Relieved intermittently with Zofran. Continues with vomiting, correlated with patient meal times. Daughter at bedside with concerns about vomiting and Na level. Daughter would like for patient to have consult with GI doctor regarding vomiting. Patient resting in bed.

## 2022-11-11 NOTE — Progress Notes (Signed)
[facility] PROGRESS NOTE  Heidi Dominguez (DOB: 03-11-1921) [id number] PCP: Lajean Manes, MD  Brief Narrative: Heidi Dominguez is a 89 y.o. female with a history of chronic HFpEF, T2DM, HTN, getting over recent RSV infection, who presented to the ED on 11/03/2022 with right shoulder pain and swelling with leukocytosis, and large fluid collection on CT. This was aspirated with drain placement by IR on 3/23. Cultures have grown E. coli for which ID has been guiding antibiotic therapy. Orthopedics also following. Hospitalization complicated by hyponatremia, SIADH for which nephrology was consulted. Fluid restriction was initiated without significant change, tolvaptan has been dosed per nephrology consultant.   Subjective: No new complaints today, no ear pain. No dyspnea but did feel short of breath last night.   Objective: BP (!) 126/51 (BP Location: Left Wrist)   Pulse 79   Temp 98.2 F (36.8 C) (Oral)   Resp 16   Ht 5\' 4"  (1.626 m)   Wt 86.5 kg   SpO2 100%   BMI 32.73 kg/m   Gen: No distress, elderly, frail HEENT: External auditory canals bilaterally have thick cerumen. TM on right visible and has no injection. L less visible, not completely visualized, particularly superiorly/posteriorly but no injection noted. No discharge or other debris. No tenderness pulling pinna or palpating mastoid. Pulm: Appears a bit dyspneic, though no tachypnea. +bibasilar rales and end-expiratory squeaks  CV: RRR, stable load holosystolic murmur. +edema GI: Soft, NT, ND, +BS  Neuro: Alert and oriented, HOH. No new focal deficits. Ext: Warm, dry, R shoulder drain sites clean, edema stable, limited ROM. Distally NVI Skin: No new rashes, lesions or ulcers on visualized skin   Assessment & Plan: Septic arthritis and abscess of right shoulder, prosthetic joint infection: s/p 40cc purulent aspirated fluid +E. coli on 3/23. She has history of E. coli infection of this joint s/p 2.5 years of suppressive  ciprofloxacin completed Sept 2022. E. coli isolate here is resistant to cipro.  - Orthopedics consulted, very poor surgical candidate in this 89yo. Dr. Veverly Fells recommended, and pt/family consented to, trial of aspiration and antibiotics in lieu of open I&D and bead placement. Fortunately she is stabilized and functionally improving. - IR placed lateral 10Fr drain (labeled #1) w/130cc pink purulent fluid output and more medial/anterior 8Fr drain (labeled #2) with 40cc yellow purulent fluid output. Will continue TID 5cc NS flushes (can decrease to daily at discharge) and output recording qShift. Plan to inject/remove drain once daily output < 10cc. Dressing changes q2-3 days and prn. F/u in IR drain clinic in 10-14 days. - ID consulted, continued on ceftriaxone until no further culture growth and will be transitioned to cefadroxil 1g po BID thereafter. Will need RCID follow up and indefinite suppression given hardware in place. Note cultures being held x14 days for P. acnes.    Hyponatremia, SIADH: Acute on chronic hypoosmolar hyponatremia, asymptomatic. - Nephrology consult appreciated. Repeat tolvaptan, and continue holding ARB today. Criteria for discharge is Na 128. - Continue checking serial Na. - Plan to continue lasix 40mg  daily after discharge. She is volume up and so will repeat IV lasix today.  Cerumen: No impaction, but would benefit from softening/removal.  - Debrox BID   Acute hypoxic respiratory failure: Put on O2.  - Diuresis as below - Neb prn  Acute on chronic HFpE, HTN: Last Echo 04/09/2015 with EF 60-65%, Grade I DD. She has been managed with HCTZ and low dose lasix. - Stopped HCTZ. Giving lasix/albumin today. - Continue BP management with  norvasc.   Aortic stenosis: Severe by echo 3/28 (mean gradient 48mmHg, peak 91.73mmHg, area by VTI 0.94cm2).  - Pt would not wish to under go valve surgery, so will not make referral/consult at this time.  - Avoid over-diuresis/lowering filling  pressures too much.   Controlled T2DM: HbA1c 7.5%.  - Hold metformin while admitted - Having postprandial hyperglycemia. Continue home 5u basal insulin, add mealtime insulin today to SSI. Pt's BMI is 32, so some resistance is anticipated.   - On statin, ASA (home dose is 162 qHS)   Nausea and vomiting: Only XR abnormality is constipation.  - Continue daily bowel regimen, had BM 3/26 per pt after suppository. Abd exam benign, eating well with resolved N/V.   Recent RSV infection:  - Continue inhaled therapies, no hypoxia.   GERD:  - Continue PPI  Patrecia Pour, MD [facility] [email or web address] 11/11/2022, 10:27 AM

## 2022-11-11 NOTE — Progress Notes (Signed)
Patient ID: Heidi Dominguez, female   DOB: 1921-08-13, 89 y.o.   MRN: [id number] [facility] Progress Note   Assessment/ Plan:   1.  Hyponatremia: Acute on chronic hypoosmolar hyponatremia in slightly hypervolemic patient.  Likely SIADH with superimposed non-osmolar triggers for ADH release.  Sodium level unchanged overnight status post tolvaptan and furosemide; I will repeat IV furosemide today given her volume status/respiratory issues along with a repeat dose of tolvaptan for additional aquaresis.   2.  Septic arthritis of right shoulder: Abscess drainage revealed E. coli infection for which she is currently on ceftriaxone based on sensitivity panel.  Seen by orthopedic surgery with no additional interventions planned. 3.  Chronic diastolic heart failure: Mild/trace lower extremity edema is present for which we will continue loop diuretic and oral fluid restriction. 4.  Hypertension: Blood pressures marginally elevated but acceptable range on combination of amlodipine, losartan and furosemide.  Subjective:   Had some worsening shortness of breath last night with chest x-ray done showing vascular congestion.  Received IV Lasix with some urine output and corresponding improvement of shortness of breath however, still appears dyspneic with conversation.   Objective:   BP (!) 126/51 (BP Location: Left Wrist)   Pulse 79   Temp 98.2 F (36.8 C) (Oral)   Resp 16   Ht 5\' 4"  (1.626 m)   Wt 86.5 kg   SpO2 100%   BMI 32.73 kg/m   Intake/Output Summary (Last 24 hours) at 11/11/2022 0809 Last data filed at 11/11/2022 0340 Gross per 24 hour  Intake --  Output 950 ml  Net -950 ml   Weight change:   Physical Exam: Gen: Resting in bed on oxygen via nasal cannula.  Appears dyspneic with conversation CVS: Pulse regular rhythm, normal rate, S1 and S2 normal Resp: Coarse breath sounds bilaterally with fine rales left base Abd: Soft, obese, nontender, bowel sounds normal Ext: Trace-1+  bilateral lower extremity edema.  Right shoulder drains present  Imaging: DG Chest 1 View  Result Date: 11/11/2022 CLINICAL DATA:  Shortness of breath EXAM: PORTABLE CHEST 1 VIEW COMPARISON:  11/03/22 FINDINGS: Cardiac shadow is enlarged but stable. Aortic calcifications are again seen. Lungs are well aerated bilaterally. Slight increase in central vascular congestion is noted with edema. Large hiatal hernia is noted. IMPRESSION: Slight increase in central vascular congestion with mild edema. Large hiatal hernia. Electronically Signed   By: Inez Catalina M.D.   On: 11/11/2022 01:16   ECHOCARDIOGRAM COMPLETE  Result Date: 11/09/2022    ECHOCARDIOGRAM REPORT   Patient Name:   Heidi Dominguez Date of Exam: 11/09/2022 Medical Rec #:  [id number]       Height:       64.0 in Accession #:    [id number]      Weight:       190.7 lb Date of Birth:  21-Feb-1921       BSA:          1.917 m Patient Age:    69 years        BP:           135/74 mmHg Patient Gender: F               HR:           90 bpm. Exam Location:  Inpatient Procedure: 2D Echo, 3D Echo, Cardiac Doppler, Color Doppler and Intracardiac            Opacification Agent Indications:     CHF/Aortic  Stenosis  History:         Patient has prior history of Echocardiogram examinations, most                  recent 04/09/2015. CHF, Signs/Symptoms:Murmur and Dyspnea; Risk                  Factors:Diabetes and Hypertension. CKD.  Sonographer:     Eartha Inch Referring Phys:  [place] Diagnosing Phys: Carlyle Dolly MD  Sonographer Comments: Image acquisition challenging due to patient body habitus and Image acquisition challenging due to respiratory motion. IMPRESSIONS  1. Left ventricular ejection fraction, by estimation, is 65 to 70%. The left ventricle has normal function. The left ventricle has no regional wall motion abnormalities. There is mild left ventricular hypertrophy. Left ventricular diastolic parameters are consistent with Grade I diastolic  dysfunction (impaired relaxation). Elevated left atrial pressure.  2. Right ventricular systolic function is normal. The right ventricular size is normal. There is mildly elevated pulmonary artery systolic pressure.  3. Left atrial size was severely dilated.  4. A small pericardial effusion is present. The pericardial effusion is circumferential. There is no evidence of cardiac tamponade.  5. The mitral valve is abnormal. Trivial mitral valve regurgitation. Moderate mitral stenosis. The mean mitral valve gradient is 6.0 mmHg.HR 87 bpm  6. The tricuspid valve is abnormal.  7. The aortic valve is tricuspid. There is moderate calcification of the aortic valve. There is moderate thickening of the aortic valve. Aortic valve regurgitation is not visualized. Severe aortic valve stenosis. Aortic valve mean gradient measures 64.0  mmHg. Aortic valve peak gradient measures 91.6 mmHg. Aortic valve area, by VTI measures 0.94 cm.  8. The pulmonic valve was abnormal.  9. The inferior vena cava is dilated in size with >50% respiratory variability, suggesting right atrial pressure of 8 mmHg. FINDINGS  Left Ventricle: Left ventricular ejection fraction, by estimation, is 65 to 70%. The left ventricle has normal function. The left ventricle has no regional wall motion abnormalities. Definity contrast agent was given IV to delineate the left ventricular  endocardial borders. The left ventricular internal cavity size was normal in size. There is mild left ventricular hypertrophy. Left ventricular diastolic parameters are consistent with Grade I diastolic dysfunction (impaired relaxation). Elevated left atrial pressure. Right Ventricle: The right ventricular size is normal. Right vetricular wall thickness was not well visualized. Right ventricular systolic function is normal. There is mildly elevated pulmonary artery systolic pressure. The tricuspid regurgitant velocity  is 3.00 m/s, and with an assumed right atrial pressure of 8 mmHg,  the estimated right ventricular systolic pressure is 123XX123 mmHg. Left Atrium: Left atrial size was severely dilated. Right Atrium: Right atrial size was normal in size. Pericardium: A small pericardial effusion is present. The pericardial effusion is circumferential. There is no evidence of cardiac tamponade. Mitral Valve: The mitral valve is abnormal. Trivial mitral valve regurgitation. Moderate mitral valve stenosis. MV peak gradient, 16.9 mmHg. The mean mitral valve gradient is 6.0 mmHg. Tricuspid Valve: The tricuspid valve is abnormal. Tricuspid valve regurgitation is mild . No evidence of tricuspid stenosis. Aortic Valve: The aortic valve is tricuspid. There is moderate calcification of the aortic valve. There is moderate thickening of the aortic valve. There is moderate aortic valve annular calcification. Aortic valve regurgitation is not visualized. Severe  aortic stenosis is present. Aortic valve mean gradient measures 64.0 mmHg. Aortic valve peak gradient measures 91.6 mmHg. Aortic valve area, by VTI measures 0.94 cm.  Pulmonic Valve: The pulmonic valve was abnormal. Pulmonic valve regurgitation is mild. No evidence of pulmonic stenosis. Aorta: The aortic root and ascending aorta are structurally normal, with no evidence of dilitation. Venous: The inferior vena cava is dilated in size with greater than 50% respiratory variability, suggesting right atrial pressure of 8 mmHg. IAS/Shunts: No atrial level shunt detected by color flow Doppler.  LEFT VENTRICLE PLAX 2D LVIDd:         3.60 cm     Diastology LVIDs:         2.60 cm     LV e' medial:    4.57 cm/s LV PW:         1.10 cm     LV E/e' medial:  27.1 LV IVS:        1.10 cm     LV e' lateral:   4.46 cm/s LVOT diam:     2.00 cm     LV E/e' lateral: 27.8 LV SV:         92 LV SV Index:   48 LVOT Area:     3.14 cm  LV Volumes (MOD) LV vol d, MOD A2C: 72.9 ml LV vol d, MOD A4C: 82.1 ml LV vol s, MOD A2C: 18.7 ml LV vol s, MOD A4C: 17.5 ml LV SV MOD A2C:     54.2  ml LV SV MOD A4C:     82.1 ml LV SV MOD BP:      59.9 ml RIGHT VENTRICLE             IVC RV S prime:     13.90 cm/s  IVC diam: 2.30 cm TAPSE (M-mode): 1.5 cm LEFT ATRIUM              Index        RIGHT ATRIUM           Index LA diam:        4.70 cm  2.45 cm/m   RA Area:     20.90 cm LA Vol (A2C):   135.0 ml 70.42 ml/m  RA Volume:   64.70 ml  33.75 ml/m LA Vol (A4C):   103.0 ml 53.72 ml/m LA Biplane Vol: 121.0 ml 63.11 ml/m  AORTIC VALVE                     PULMONIC VALVE AV Area (Vmax):    0.91 cm      PR End Diast Vel: 5.76 msec AV Area (Vmean):   0.92 cm AV Area (VTI):     0.94 cm AV Vmax:           478.50 cm/s AV Vmean:          363.500 cm/s AV VTI:            0.987 m AV Peak Grad:      91.6 mmHg AV Mean Grad:      64.0 mmHg LVOT Vmax:         139.00 cm/s LVOT Vmean:        107.000 cm/s LVOT VTI:          0.294 m LVOT/AV VTI ratio: 0.30  AORTA Ao Root diam: 2.90 cm Ao Asc diam:  3.40 cm MITRAL VALVE                TRICUSPID VALVE MV Area VTI:  1.87 cm      TR Peak grad:   36.0 mmHg MV Peak grad: 16.9  mmHg     TR Mean grad:   27.0 mmHg MV Mean grad: 6.0 mmHg      TR Vmax:        300.00 cm/s MV Vmax:      2.05 m/s      TR Vmean:       255.0 cm/s MV Vmean:     114.5 cm/s MR Peak grad: 65.9 mmHg     SHUNTS MR Vmax:      406.00 cm/s   Systemic VTI:  0.29 m MV E velocity: 124.00 cm/s  Systemic Diam: 2.00 cm Carlyle Dolly MD Electronically signed by Carlyle Dolly MD Signature Date/Time: 11/09/2022/3:03:42 PM    Final (Updated)     Labs: BMET Recent Labs  Lab 11/06/22 0440 11/07/22 [id number] 11/07/22 1508 11/08/22 0350 11/08/22 1630 11/09/22 0039 11/10/22 0340 11/11/22 0617  NA 124* 124* 124* 125* 126* 124* 126* 126*  K 4.6 4.6 4.1 4.1  --  4.2 4.3 4.6  CL 91* 88* 86* 86*  --  85* 88* 86*  CO2 26 27 28 30   --  26 28 29   GLUCOSE 160* 138* 184* 157*  --  184* 107* 175*  BUN 13 9 8 10   --  11 16 22   CREATININE 0.60 0.67 0.68 0.62  --  0.66 0.76 0.88  CALCIUM 8.2* 8.4* 8.5* 8.5*  --  8.8* 8.5*  8.9  PHOS  --   --   --  2.9  --  2.6 3.2 4.3   CBC Recent Labs  Lab 11/05/22 0443 11/06/22 0440 11/07/22 0333 11/08/22 0350  WBC 12.8* 12.6* 13.9* 13.0*  HGB 10.0* 10.0* 10.2* 9.5*  HCT 31.0* 31.7* 31.6* 28.7*  MCV 84.7 85.7 85.4 83.7  PLT 391 375 352 365    Medications:     acidophilus  1 capsule Oral QHS   amLODipine  10 mg Oral Daily   artificial tears   Both Eyes BID   aspirin EC  162 mg Oral QHS   budesonide  0.5 mg Nebulization BH-qamhs   cefadroxil  1,000 mg Oral BID   dextromethorphan-guaiFENesin  1 tablet Oral BID   diclofenac Sodium  2 g Topical TID   docusate sodium  100 mg Oral BID   feeding supplement (GLUCERNA SHAKE)  237 mL Oral BID BM   furosemide  40 mg Intravenous Once   furosemide  40 mg Intravenous Once   heparin  5,000 Units Subcutaneous Q8H   insulin aspart  0-20 Units Subcutaneous TID WC   insulin aspart  3 Units Subcutaneous TID WC   insulin detemir  5 Units Subcutaneous Daily   loratadine  10 mg Oral QHS   pantoprazole  40 mg Oral Daily   polyethylene glycol  17 g Oral BID   polyvinyl alcohol  2 drop Both Eyes Daily   senna  1 tablet Oral BID   sodium chloride flush  5 mL Intracatheter Q8H   tolvaptan  15 mg Oral Once    Elmarie Shiley, MD 11/11/2022, 8:09 AM

## 2022-11-12 DIAGNOSIS — I5032 Chronic diastolic (congestive) heart failure: Secondary | ICD-10-CM | POA: Diagnosis not present

## 2022-11-12 DIAGNOSIS — E871 Hypo-osmolality and hyponatremia: Secondary | ICD-10-CM | POA: Diagnosis not present

## 2022-11-12 DIAGNOSIS — E1165 Type 2 diabetes mellitus with hyperglycemia: Secondary | ICD-10-CM | POA: Diagnosis not present

## 2022-11-12 DIAGNOSIS — M009 Pyogenic arthritis, unspecified: Secondary | ICD-10-CM | POA: Diagnosis not present

## 2022-11-12 LAB — GLUCOSE, CAPILLARY
Glucose-Capillary: 175 mg/dL — ABNORMAL HIGH (ref 70–99)
Glucose-Capillary: 236 mg/dL — ABNORMAL HIGH (ref 70–99)
Glucose-Capillary: 271 mg/dL — ABNORMAL HIGH (ref 70–99)
Glucose-Capillary: 142 mg/dL — ABNORMAL HIGH (ref 70–99)

## 2022-11-12 LAB — RENAL FUNCTION PANEL
Albumin: 2 g/dL — ABNORMAL LOW (ref 3.5–5.0)
Anion gap: 9 (ref 5–15)
BUN: 25 mg/dL — ABNORMAL HIGH (ref 8–23)
CO2: 29 mmol/L (ref 22–32)
Calcium: 8.9 mg/dL (ref 8.9–10.3)
Chloride: 89 mmol/L — ABNORMAL LOW (ref 98–111)
Creatinine, Ser: 0.85 mg/dL (ref 0.44–1.00)
GFR, Estimated: >60 mL/min (ref >60)
Glucose, Bld: 133 mg/dL — ABNORMAL HIGH (ref 70–99)
Phosphorus: 3.7 mg/dL (ref 2.5–4.6)
Potassium: 4.7 mmol/L (ref 3.5–5.1)
Sodium: 127 mmol/L — ABNORMAL LOW (ref 135–145)

## 2022-11-12 LAB — TROPONIN I (HIGH SENSITIVITY): Troponin I (High Sensitivity): 9 ng/L (ref <18)

## 2022-11-12 MED ORDER — SENNA 8.6 MG PO TABS
2.0000 | ORAL_TABLET | Freq: Two times a day (BID) | ORAL | Status: DC
  Administered 2022-11-12 – 2022-11-17 (×11): 17.2 mg via ORAL
  Filled 2022-11-12 (×11): qty 2

## 2022-11-12 MED ORDER — FUROSEMIDE 10 MG/ML IJ SOLN
40.0000 mg | Freq: Once | INTRAMUSCULAR | Status: AC
  Administered 2022-11-12: 40 mg via INTRAVENOUS
  Filled 2022-11-12: qty 4

## 2022-11-12 MED ORDER — LORAZEPAM 0.5 MG PO TABS
0.5000 mg | ORAL_TABLET | Freq: Once | ORAL | Status: AC
  Administered 2022-11-12: 0.5 mg via ORAL
  Filled 2022-11-12: qty 1

## 2022-11-12 MED ORDER — ALBUMIN HUMAN 25 % IV SOLN
12.5000 g | Freq: Once | INTRAVENOUS | Status: AC
  Administered 2022-11-12: 12.5 g via INTRAVENOUS
  Filled 2022-11-12: qty 50

## 2022-11-12 MED ORDER — BISACODYL 10 MG RE SUPP
10.0000 mg | Freq: Once | RECTAL | Status: AC
  Administered 2022-11-12: 10 mg via RECTAL
  Filled 2022-11-12: qty 1

## 2022-11-12 MED ORDER — GUAIFENESIN ER 600 MG PO TB12: 600.0000 mg | ORAL_TABLET | Freq: Two times a day (BID) | ORAL | Status: DC

## 2022-11-12 MED ORDER — NITROGLYCERIN 0.4 MG SL SUBL: 0.4000 mg | SUBLINGUAL_TABLET | SUBLINGUAL | Status: DC | PRN

## 2022-11-12 MED ORDER — BISACODYL 10 MG RE SUPP: 10.0000 mg | Freq: Every day | RECTAL | Status: DC | PRN

## 2022-11-12 MED ORDER — MORPHINE SULFATE (PF) 2 MG/ML IV SOLN: 2.0000 mg | INTRAVENOUS | Status: DC | PRN

## 2022-11-12 NOTE — Consult Note (Signed)
[facility] Gastroenterology Consult  Referring Provider: No ref. provider found Primary Care Physician:  Lajean Manes, MD Primary Gastroenterologist: Dr. Clarene Essex  Reason for Consultation: Nausea, vomiting   SUBJECTIVE:   HPI: Heidi Dominguez is a 89 y.o. female with past medical history significant for congestive heart failure, chronic kidney disease, gastroesophageal reflux disease, hiatal hernia, osteoarthritis, type 2 diabetes mellitus. She is status post cholecystectomy and required pancreatic duct stent placement in 2020 for pancreatitis.   She presented to [facility] on 11/03/22 for abnormal labs, hyponatremia. She is being evaluated and treated for SIADH. During this hospitalization she has also been found to have a large periprosthetic fluid collection around the right humerus that is now status post aspiration by IR on 11/04/22, she is on oral antibiotic therapy. She is also being treated for acute hypoxic respiratory failure, history of heart failure, undergoing diuresis and on supplemental oxygen (wears none at baseline).   I received after hours call from patient's daughter this AM requested GI evaluate her mother during this inpatient stay. She has specific concerns with the way that her mother is breathing during the course of a meal. She showed me multiple videos on her phone of the specific instances she is concerned about, it appears that her mother has some increased work on breathing with possibly some accessory muscle use during eating. Additionally, patient's daughter is concerned that whenever her mother drinks liquids, she will regurgitate. Patient noted also being constipated.   Patient sees Dr. Watt Climes in the outpatient setting. Earlier this month, there was telephone encounter from patient's nursing facility with concern for nausea and vomiting. This concern was resolved with no input from [facility].   Barium swallow 07/20/22 showed that at final swallows there was  contrast/debris in the esophagus and suggestive of corkscrew appearance, some flash laryngeal penetration without aspiration.   She has been seen by SLP during this hospital stay and diet recommendations have been made, no further follow up with SLP is planned.  EGD/colonoscopy completed 08/23/2007 (Dr. Amedeo Plenty) showed Schatzki ring at GE junction status post dilatation with Savary 17 mm, large hiatal hernia, few sigmoid diverticula.    Past Medical History:  Diagnosis Date   Acute pancreatitis    hx of    Arthritis    "qwhere" (07/04/2018)   Chronic back pain    "all over" (07/04/2018)   Chronic diastolic CHF (congestive heart failure) (HCC)    Chronic kidney disease    stage  3 chronic kidney disease    Complication of anesthesia    "I have a hard time waking up"   Degenerative joint disease of shoulder region    Dry eye syndrome    Dyspnea    Family history of adverse reaction to anesthesia    "daughter has the shakes when she wakes up"   GERD (gastroesophageal reflux disease)    Heart murmur    High cholesterol    History of hiatal hernia    HOH (hard of hearing)    Hypertension    Impacted cerumen of both ears    hx of    Muscle weakness (generalized)    Osteoarthritis    Overactive bladder    Pancreatitis    hx of    Phlebitis    "BLE"   Rotator cuff tear    right    Tear of right supraspinatus tendon    Thrombocytopenia (HCC)    hx of    Type II diabetes mellitus ([place])  Urinary tract infection    hx of    UTI (urinary tract infection) 09/12/2018   Xerosis cutis    hx of    Past Surgical History:  Procedure Laterality Date   ABDOMINAL AORTOGRAM W/LOWER EXTREMITY N/A 12/22/2020   Procedure: ABDOMINAL AORTOGRAM W/LOWER EXTREMITY;  Surgeon: Cherre Robins, MD;  Location: [place] [facility];  Service: Cardiovascular;  Laterality: N/A;   ABDOMINAL HYSTERECTOMY     BACK SURGERY     CATARACT EXTRACTION W/ INTRAOCULAR LENS  IMPLANT, BILATERAL Bilateral     CHOLECYSTECTOMY  2016   ERCP N/A 08/30/2018   Procedure: ENDOSCOPIC RETROGRADE CHOLANGIOPANCREATOGRAPHY (ERCP);  Surgeon: Clarene Essex, MD;  Location: [facility];  Service: Endoscopy;  Laterality: N/A;   FIXATION KYPHOPLASTY     INCISION AND DRAINAGE Right 11/08/2018   Procedure: INCISION AND DRAINAGE right shoulder, placement of antibiotic beads ;  Surgeon: [name], MD;  Location: [facility];  Service: Orthopedics;  Laterality: Right;   IR US GUIDE BX ASP/DRAIN  11/04/2022   IR US GUIDE BX ASP/DRAIN  11/04/2022   JOINT REPLACEMENT     LAPAROSCOPIC CHOLECYSTECTOMY SINGLE SITE WITH INTRAOPERATIVE CHOLANGIOGRAM N/A 04/11/2015   Procedure: LAPAROSCOPIC LYSIS OF ADHESIONS, LAPAROSCOPIC CHOLECYSTECTOMY WITH INTRAOPERATIVE CHOLANGIOGRAM;  Surgeon: Michael Boston, MD;  Location: [facility];  Service: General;  Laterality: N/A;   PANCREATIC STENT PLACEMENT  08/30/2018   Procedure: PANCREATIC STENT PLACEMENT;  Surgeon: Clarene Essex, MD;  Location: [facility];  Service: Endoscopy;;   PERIPHERAL VASCULAR INTERVENTION  12/22/2020   Procedure: PERIPHERAL VASCULAR INTERVENTION;  Surgeon: Cherre Robins, MD;  Location: [place] [place] [facility];  Service: Cardiovascular;;  left anterior tibial artery   REMOVAL OF STONES  08/30/2018   Procedure: REMOVAL OF STONES;  Surgeon: Clarene Essex, MD;  Location: [facility];  Service: Endoscopy;;   SHOULDER OPEN ROTATOR CUFF REPAIR Bilateral    SPHINCTEROTOMY  08/30/2018   Procedure: SPHINCTEROTOMY;  Surgeon: Clarene Essex, MD;  Location: [facility];  Service: Endoscopy;;   TOTAL KNEE ARTHROPLASTY Bilateral    Prior to Admission medications   Medication Sig Start Date End Date Taking? Authorizing Provider  acetaminophen (TYLENOL) 325 MG tablet Take 650 mg by mouth 3 (three) times daily.   Yes [provider]  albuterol (VENTOLIN HFA) 108 (90 Base) MCG/ACT inhaler Inhale 2 puffs into the lungs every 6 (six) hours as needed for wheezing or shortness of breath.   Yes [provider]  amLODipine (NORVASC) 10 MG tablet Take 10 mg by mouth daily. HOLD if SBP < 110.   Yes [provider]  aspirin EC 81 MG tablet Take 162 mg by mouth at bedtime.   Yes [provider]  budesonide (PULMICORT) 0.5 MG/2ML nebulizer solution Take 2 mLs (0.5 mg total) by nebulization in the morning and at bedtime. 10/16/22  Yes Cobb, Karie Schwalbe, NP  Cholecalciferol (VITAMIN D3) 25 MCG (1000 UT) capsule Take 1,000 Units by mouth every Monday.   Yes [provider]  Cinnamon 500 MG capsule Take 500 mg by mouth daily.   Yes [provider]  dextromethorphan-guaiFENesin (MUCINEX DM) 30-600 MG 12hr tablet Take 1 tablet by mouth 2 (two) times daily. 10/16/22  Yes Cobb, Karie Schwalbe, NP  diclofenac Sodium (VOLTAREN ARTHRITIS PAIN) 1 % GEL Apply 2 g topically 3 (three) times daily. To both shoulders.   Yes [provider]  furosemide (LASIX) 20 MG tablet Take 0.5 tablets (10 mg total) by mouth daily for 10 days. Patient taking differently:  Take 20 mg by mouth daily. 10/26/22 11/05/22 Yes Tacy Learn, PA-C  hypromellose (GENTEAL SEVERE) 0.3 % GEL ophthalmic ointment Place 1 Application into both eyes 2 (two) times daily.   Yes [provider]  insulin lispro (HUMALOG) 100 UNIT/ML injection Inject 0-12 Units into the skin 3 (three) times daily before meals. Per sliding scale: If blood sugar is less than 70, call NP/PA. If blood sugar is 70 to 200, give 0 units. If blood sugar is 201 to 250, give 2 units. If blood sugar is 251 to 300, give 4 units. If blood sugar is 301 to 350, give 6 units. If blood sugar is 351 to 400, give 8 units. If blood sugar is 401 to 450, give 10 units. If blood sugar is 451 to 600, give 12 units. Recheck in 2 hours.   Yes [provider]  ipratropium-albuterol (DUONEB) 0.5-2.5 (3) MG/3ML SOLN Take 3 mLs by nebulization 2 (two) times daily. 09/19/22  Yes [provider]  Lactobacillus (PROBIOTIC  ACIDOPHILUS) CAPS Take 1 capsule by mouth at bedtime.   Yes [provider]  loratadine (CLARITIN) 10 MG tablet Take 10 mg by mouth at bedtime.   Yes [provider]  losartan (COZAAR) 50 MG tablet Take 50 mg by mouth daily. HOLD for SBP < 110.   Yes [provider]  metFORMIN (GLUCOPHAGE) 500 MG tablet Take 0.5 tablets (250 mg total) by mouth 2 (two) times daily with a meal. Patient taking differently: Take 500 mg by mouth 2 (two) times daily with a meal. 10/26/22 11/25/22 Yes Tacy Learn, PA-C  Multiple Vitamins-Minerals (PRESERVISION AREDS) CAPS Take 1 capsule by mouth daily.   Yes [provider]  OMEPRAZOLE PO Take 20 mg by mouth daily. Omeprazole 20 mg DR disintegrating tablet.   Yes [provider]  ondansetron (ZOFRAN) 4 MG tablet Take 4 mg by mouth every 8 (eight) hours as needed for nausea or vomiting.   Yes [provider]  Polyethyl Glycol-Propyl Glycol (LUBRICANT EYE DROPS) 0.4-0.3 % SOLN Place 2 drops into both eyes daily.   Yes [provider]  polyethylene glycol powder (GLYCOLAX/MIRALAX) 17 GM/SCOOP powder Take 17 g by mouth at bedtime.   Yes [provider]  polyvinyl alcohol (LIQUIFILM TEARS) 1.4 % ophthalmic solution Place 1 drop into both eyes in the morning, at noon, in the evening, and at bedtime. Following injection treatment   Yes [provider]  guaiFENesin (ROBITUSSIN) 100 MG/5ML liquid Take 10 mLs by mouth every 6 (six) hours as needed for cough or to loosen phlegm. Patient not taking: Reported on 11/04/2022 10/01/22   Barb Merino, MD  LEVEMIR FLEXPEN 100 UNIT/ML FlexPen Inject 5 Units into the skin daily. 11/02/22   [provider]   Current Facility-Administered Medications  Medication Dose Route Frequency Provider Last Rate Last Admin   acetaminophen (TYLENOL) tablet 650 mg  650 mg Oral Q6H PRN Neena Rhymes, MD   650 mg at 11/06/22 1959   Or   acetaminophen (TYLENOL)  suppository 650 mg  650 mg Rectal Q6H PRN Norins, Heinz Knuckles, MD       acidophilus (RISAQUAD) capsule 1 capsule  1 capsule Oral QHS Norins, Heinz Knuckles, MD   1 capsule at 11/11/22 2107   albuterol (PROVENTIL) (2.5 MG/3ML) 0.083% nebulizer solution 2.5 mg  2.5 mg Nebulization Q6H PRN Wilson Singer I, RPH   2.5 mg at 11/11/22 1942   amLODipine (NORVASC) tablet 10 mg  10 mg Oral  Daily Norins, Heinz Knuckles, MD   10 mg at 11/12/22 1110   artificial tears (LACRILUBE) ophthalmic ointment   Both Eyes BID Neena Rhymes, MD   Given at 11/12/22 1112   aspirin EC tablet 162 mg  162 mg Oral QHS Neena Rhymes, MD   162 mg at 11/11/22 2106   bisacodyl (DULCOLAX) suppository 10 mg  10 mg Rectal Daily PRN Patrecia Pour, MD       budesonide (PULMICORT) nebulizer solution 0.5 mg  0.5 mg Nebulization BH-qamhs Norins, Heinz Knuckles, MD   0.5 mg at 11/12/22 0831   carbamide peroxide (DEBROX) 6.5 % OTIC (EAR) solution 5 drop  5 drop Both EARS BID Patrecia Pour, MD   5 drop at 11/12/22 1113   cefadroxil (DURICEF) capsule 1,000 mg  1,000 mg Oral BID [name], MD   1,000 mg at 11/12/22 1110   dextromethorphan-guaiFENesin (MUCINEX DM) 30-600 MG per 12 hr tablet 1 tablet  1 tablet Oral BID Neena Rhymes, MD   1 tablet at 11/12/22 1110   diclofenac Sodium (VOLTAREN) 1 % topical gel 2 g  2 g Topical TID Neena Rhymes, MD   2 g at 11/12/22 1113   docusate sodium (COLACE) capsule 100 mg  100 mg Oral BID Donne Hazel, MD   100 mg at 11/11/22 2107   feeding supplement (GLUCERNA SHAKE) (GLUCERNA SHAKE) liquid 237 mL  237 mL Oral BID BM Donne Hazel, MD   237 mL at 11/12/22 1527   heparin injection 5,000 Units  5,000 Units Subcutaneous Q8H Norins, Heinz Knuckles, MD   5,000 Units at 11/12/22 1525   insulin aspart (novoLOG) injection 0-20 Units  0-20 Units Subcutaneous TID WC Norins, Heinz Knuckles, MD   7 Units at 11/12/22 1335   insulin aspart (novoLOG) injection 3 Units  3 Units Subcutaneous TID WC Patrecia Pour, MD   3  Units at 11/12/22 1335   insulin detemir (LEVEMIR) injection 5 Units  5 Units Subcutaneous Daily Patrecia Pour, MD   5 Units at 11/12/22 1110   ipratropium-albuterol (DUONEB) 0.5-2.5 (3) MG/3ML nebulizer solution 3 mL  3 mL Nebulization TID Patrecia Pour, MD   3 mL at 11/12/22 1309   loratadine (CLARITIN) tablet 10 mg  10 mg Oral QHS Norins, Heinz Knuckles, MD   10 mg at 11/11/22 2106   melatonin tablet 3 mg  3 mg Oral QHS PRN Howerter, Justin B, DO   3 mg at 11/09/22 2055   morphine (PF) 2 MG/ML injection 2 mg  2 mg Intravenous Q2H PRN Mansy, Jan A, MD       naloxone [facility]) injection 0.4 mg  0.4 mg Intravenous PRN Howerter, Justin B, DO       nitroGLYCERIN (NITROSTAT) SL tablet 0.4 mg  0.4 mg Sublingual Q5 min PRN Mansy, Jan A, MD       ondansetron [facility]) injection 4 mg  4 mg Intravenous Q6H PRN Donne Hazel, MD   4 mg at 11/11/22 1243   pantoprazole (PROTONIX) EC tablet 40 mg  40 mg Oral Daily Norins, Heinz Knuckles, MD   40 mg at 11/12/22 1110   polyethylene glycol (MIRALAX / GLYCOLAX) packet 17 g  17 g Oral BID Donne Hazel, MD   17 g at 11/12/22 1110   polyvinyl alcohol (LIQUIFILM TEARS) 1.4 % ophthalmic solution 2 drop  2 drop Both Eyes Daily Norins, Heinz Knuckles, MD   2 drop at 11/12/22 1113  senna (SENOKOT) tablet 17.2 mg  2 tablet Oral BID Vance Gather B, MD   17.2 mg at 11/12/22 1111   sodium chloride flush (NS) 0.9 % injection 5 mL  5 mL Intracatheter Q8H Markus Daft, MD   5 mL at 11/12/22 1528   Allergies as of 11/03/2022 - Review Complete 11/03/2022  Allergen Reaction Noted   Augmentin [amoxicillin-pot clavulanate] Diarrhea 08/18/2020   Bactrim [sulfamethoxazole-trimethoprim] Diarrhea and Itching 09/28/2022   Calan [verapamil] Diarrhea 10/13/2015   Catapres [clonidine hcl] Other (See Comments) 09/24/2011   Codeine Other (See Comments) 08/31/2018   Hydrocodone Other (See Comments) 08/31/2018   Other Other (See Comments) 09/24/2011   Sulfa antibiotics Itching 09/24/2011   Zestril  [lisinopril] Cough 12/21/2020   Azulfidine [sulfasalazine] Itching 09/24/2011   Family History  Problem Relation Age of Onset   Hypertension Mother    Diabetes Mellitus I Mother    Hypertension Father    Social History   Socioeconomic History   Marital status: Widowed    Spouse name: Not on file   Number of children: Not on file   Years of education: Not on file   Highest education level: Not on file  Occupational History   Occupation: retired  Tobacco Use   Smoking status: Never   Smokeless tobacco: Never  Vaping Use   Vaping Use: Never used  Substance and Sexual Activity   Alcohol use: No    Comment: rare   Drug use: No   Sexual activity: Not on file  Other Topics Concern   Not on file  Social History Narrative   Not on file   Social Determinants of Health   Financial Resource Strain: Not on file  Food Insecurity: No Food Insecurity (11/03/2022)   Hunger Vital Sign    Worried About Running Out of Food in the Last Year: Never true    Ran Out of Food in the Last Year: Never true  Transportation Needs: No Transportation Needs (11/03/2022)   PRAPARE - Hydrologist (Medical): No    Lack of Transportation (Non-Medical): No  Physical Activity: Not on file  Stress: Not on file  Social Connections: Not on file  Intimate Partner Violence: Not At Risk (11/03/2022)   Humiliation, Afraid, Rape, and Kick questionnaire    Fear of Current or Ex-Partner: No    Emotionally Abused: No    Physically Abused: No    Sexually Abused: No   Review of Systems:  Review of Systems  Respiratory:  Negative for shortness of breath.   Cardiovascular:  Negative for chest pain.  Gastrointestinal:  Positive for constipation, nausea and vomiting. Negative for abdominal pain and blood in stool.    OBJECTIVE:   Temp:  [98.8 F (37.1 C)-99.6 F (37.6 C)] 98.8 F (37.1 C) (03/31 1446) Pulse Rate:  [78-94] 81 (03/31 1446) Resp:  [15-20] 18 (03/31 1446) BP:  (105-158)/(54-66) 127/65 (03/31 1446) SpO2:  [92 %-100 %] 100 % (03/31 1446) Last BM Date : 11/10/22 Physical Exam Constitutional:      General: She is not in acute distress.    Appearance: She is not ill-appearing, toxic-appearing or diaphoretic.  Cardiovascular:     Rate and Rhythm: Normal rate.     Heart sounds: Murmur heard.     Comments: Pain to palpation in lower sternum. Pulmonary:     Effort: No respiratory distress.     Breath sounds: Normal breath sounds.     Comments: Supplemental oxygen nasal cannula in place  at time of examination, 2.5 L. Abdominal:     General: Bowel sounds are normal. There is no distension.     Palpations: Abdomen is soft.     Tenderness: There is no abdominal tenderness. There is no guarding.  Skin:    General: Skin is warm and dry.  Neurological:     Mental Status: She is alert.     Labs: No results for input(s): "WBC", "HGB", "HCT", "PLT" in the last 72 hours. BMET Recent Labs    11/10/22 0340 11/11/22 0617 11/12/22 0354  NA 126* 126* 127*  K 4.3 4.6 4.7  CL 88* 86* 89*  CO2 28 29 29   GLUCOSE 107* 175* 133*  BUN 16 22 25*  CREATININE 0.76 0.88 0.85  CALCIUM 8.5* 8.9 8.9   LFT Recent Labs    11/12/22 0354  ALBUMIN 2.0*   PT/INR No results for input(s): "LABPROT", "INR" in the last 72 hours.  Diagnostic imaging: DG Chest 1 View  Result Date: 11/11/2022 CLINICAL DATA:  Shortness of breath EXAM: PORTABLE CHEST 1 VIEW COMPARISON:  11/03/22 FINDINGS: Cardiac shadow is enlarged but stable. Aortic calcifications are again seen. Lungs are well aerated bilaterally. Slight increase in central vascular congestion is noted with edema. Large hiatal hernia is noted. IMPRESSION: Slight increase in central vascular congestion with mild edema. Large hiatal hernia. Electronically Signed   By: Inez Catalina M.D.   On: 11/11/2022 01:16    IMPRESSION: Nausea and vomiting Dysphagia History hiatal hernia History Schatzki ring Acute hypoxic  respiratory failure SIADH Periprosthetic upper extremity infection s/p drainage 11/04/22  PLAN: -It appears that the significant concerns from the patients daughter are largely in relation to increased work on breathing during eating/meals (which is from acute hypoxic respiratory failure and increased accessory muscle use), though there may be a component of dysphagia at play  -She has history of hiatal hernia and Schatzki ring in the past -Would not consider aggressive endoscopic measures given her current clinical respiratory state and with hyponatremia as well as with her age -Would recommend to start with a barium swallow evaluation to determine if there is any clinical change from the test completed in December 2023 -Daughter will attempt more solid foods with her mother this afternoon to determine if liquids or solids are more challenging -Pending the barium swallow evaluation, may benefit from repeat SLP evaluation -Your IM management hypoxic respiratory failure as I believe this will improve symptoms as well -Continue as needed anti-emetics -Diet as tolerated -[facility] will follow   LOS: 9 days   Danton Clap, [facility]

## 2022-11-12 NOTE — Progress Notes (Signed)
Patient ID: Heidi Dominguez, female   DOB: 1921-03-24, 89 y.o.   MRN: [id number] [facility] Progress Note   Assessment/ Plan:   1.  Hyponatremia: Acute on chronic hypoosmolar hyponatremia in a hypervolemic patient.  Appears to be from a combination of non-osmolar ADH stimulation from nausea/pain along with hypervolemia in the setting of diastolic heart failure.  Sodium level improving sluggishly with furosemide/tolvaptan.  Will discontinue tolvaptan today and order for furosemide 40 mg IV twice daily for additional volume unloading. 2.  Septic arthritis of right shoulder: Abscess drainage revealed E. coli infection for which she is currently on cefadroxil after earlier being on intravenous antibiotics.  No indication for open drainage per orthopedics. 3.  Chronic diastolic heart failure: Mild/trace lower extremity edema is present for which we will continue loop diuretic and oral fluid restriction. 4.  Hypertension: Blood pressures marginally elevated on amlodipine and furosemide.  Losartan on hold as we attempt to correct hyponatremia.  Subjective:   Problems with postprandial nausea/emesis noted overnight.  She reports that she is comfortable on oxygen via nasal cannula and reports some intermittent cough.   Objective:   BP (!) 158/66 (BP Location: Left Wrist)   Pulse 88   Temp 99.2 F (37.3 C) (Oral)   Resp 18   Ht 5\' 4"  (1.626 m)   Wt 86.5 kg   SpO2 97%   BMI 32.73 kg/m   Intake/Output Summary (Last 24 hours) at 11/12/2022 0743 Last data filed at 11/12/2022 0544 Gross per 24 hour  Intake --  Output 25 ml  Net -25 ml   Weight change:   Physical Exam: Gen: Appears comfortable but somnolent resting in bed, oxygen via nasal cannula CVS: Pulse regular rhythm, normal rate, S1 and S2 normal Resp: Coarse breath sounds bilaterally with fine rales left base Abd: Soft, obese, nontender, bowel sounds normal Ext: 2+ pitting lower extremity edema.  Right shoulder drains  present  Imaging: DG Chest 1 View  Result Date: 11/11/2022 CLINICAL DATA:  Shortness of breath EXAM: PORTABLE CHEST 1 VIEW COMPARISON:  11/03/22 FINDINGS: Cardiac shadow is enlarged but stable. Aortic calcifications are again seen. Lungs are well aerated bilaterally. Slight increase in central vascular congestion is noted with edema. Large hiatal hernia is noted. IMPRESSION: Slight increase in central vascular congestion with mild edema. Large hiatal hernia. Electronically Signed   By: Inez Catalina M.D.   On: 11/11/2022 01:16    Labs: BMET Recent Labs  Lab 11/07/22 0333 11/07/22 1508 11/08/22 0350 11/08/22 1630 11/09/22 0039 11/10/22 0340 11/11/22 0617 11/12/22 0354  NA 124* 124* 125* 126* 124* 126* 126* 127*  K 4.6 4.1 4.1  --  4.2 4.3 4.6 4.7  CL 88* 86* 86*  --  85* 88* 86* 89*  CO2 27 28 30   --  26 28 29 29   GLUCOSE 138* 184* 157*  --  184* 107* 175* 133*  BUN 9 8 10   --  11 16 22  25*  CREATININE 0.67 0.68 0.62  --  0.66 0.76 0.88 0.85  CALCIUM 8.4* 8.5* 8.5*  --  8.8* 8.5* 8.9 8.9  PHOS  --   --  2.9  --  2.6 3.2 4.3 3.7   CBC Recent Labs  Lab 11/06/22 0440 11/07/22 0333 11/08/22 0350  WBC 12.6* 13.9* 13.0*  HGB 10.0* 10.2* 9.5*  HCT 31.7* 31.6* 28.7*  MCV 85.7 85.4 83.7  PLT 375 352 365    Medications:     acidophilus  1 capsule Oral  QHS   amLODipine  10 mg Oral Daily   artificial tears   Both Eyes BID   aspirin EC  162 mg Oral QHS   budesonide  0.5 mg Nebulization BH-qamhs   carbamide peroxide  5 drop Both EARS BID   cefadroxil  1,000 mg Oral BID   dextromethorphan-guaiFENesin  1 tablet Oral BID   diclofenac Sodium  2 g Topical TID   docusate sodium  100 mg Oral BID   feeding supplement (GLUCERNA SHAKE)  237 mL Oral BID BM   furosemide  40 mg Intravenous Once   furosemide  40 mg Intravenous Once   heparin  5,000 Units Subcutaneous Q8H   insulin aspart  0-20 Units Subcutaneous TID WC   insulin aspart  3 Units Subcutaneous TID WC   insulin detemir  5  Units Subcutaneous Daily   ipratropium-albuterol  3 mL Nebulization TID   loratadine  10 mg Oral QHS   pantoprazole  40 mg Oral Daily   polyethylene glycol  17 g Oral BID   polyvinyl alcohol  2 drop Both Eyes Daily   senna  1 tablet Oral BID   sodium chloride flush  5 mL Intracatheter Q8H    Elmarie Shiley, MD 11/12/2022, 7:43 AM

## 2022-11-12 NOTE — Progress Notes (Signed)
[facility] PROGRESS NOTE  Heidi Dominguez (DOB: [birth year]) [id number] PCP: Lajean Manes, MD  Brief Narrative: Heidi Dominguez is a 89 y.o. female with a history of chronic HFpEF, T2DM, HTN, getting over recent RSV infection, who presented to the ED on 11/03/2022 with right shoulder pain and swelling with leukocytosis, and large fluid collection on CT. This was aspirated with drain placement by IR on 3/23. Cultures have grown E. coli for which ID has been guiding antibiotic therapy. Orthopedics also following. Hospitalization complicated by hyponatremia, SIADH for which nephrology was consulted. Fluid restriction was initiated without significant change, tolvaptan has been dosed per nephrology with limited improvement. Diuresis with furosemide is ongoing.  Subjective: Constipation and vomiting reported over past 24 hours. No worse right shoulder pain or fever. No dyspnea. Eating fine.   Objective: BP (!) 158/66 (BP Location: Left Wrist)   Pulse 88   Temp 99.2 F (37.3 C) (Oral)   Resp 18   Ht 5\' 4"  (1.626 m)   Wt 86.5 kg   SpO2 97%   BMI 32.73 kg/m   Gen: No distress, elderly Pulm: Coarse expiratory sounds, predominantly upper airway, diminished  CV: RRR with loud holosystolic murmur.  GI: Soft, hypoactive bowel sounds, no tenderness to palpation or distention. Neuro: Alert and oriented. No new focal deficits. Ext: Warm, dry. RUE NVI Skin: R shoulder drain sites dressings c/d/i. No new rashes, lesions or ulcers on visualized skin   Assessment & Plan: Septic arthritis and abscess of right shoulder, prosthetic joint infection: s/p 40cc purulent aspirated fluid +E. coli on 3/23. She has history of E. coli infection of this joint s/p 2.5 years of suppressive ciprofloxacin completed Sept 2022. E. coli isolate here is resistant to cipro.  - Orthopedics consulted, very poor surgical candidate in this 89yo. Dr. Veverly Fells recommended, and pt/family consented to, trial of aspiration and  antibiotics in lieu of open I&D and bead placement. Fortunately she is stabilized and functionally improving. - IR placed lateral 10Fr drain (labeled #1) w/130cc pink purulent fluid output and more medial/anterior 8Fr drain (labeled #2) with 40cc yellow purulent fluid output. Will contact IR prior to DC to remove drains. - ID consulted, continued on ceftriaxone until no further culture growth and transitioned cefadroxil 1g po BID thereafter through 12/15/2022. Will need RCID follow up and indefinite suppression given hardware in place. Note cultures being held x14 days for P. acnes.    Hyponatremia, SIADH: Acute on chronic hypoosmolar hyponatremia, asymptomatic. - Nephrology consult appreciated. Na stubborn at 127, remains asymptomatic. Augment lasix today, monitor closely.   - Plan to continue lasix 40mg  daily after discharge.    Cerumen: No impaction, but would benefit from softening/removal.  - Debrox BID   Acute hypoxic respiratory failure, COPD: Put on O2.  - Diuresis as below - Neb prn  Acute on chronic HFpE, HTN: Last Echo 04/09/2015 with EF 60-65%, Grade I DD. She has been managed with HCTZ and low dose lasix. - Stopped HCTZ. Giving lasix IV BID. Cr stable, BUN slightly rising. - Continue BP management with norvasc.   Aortic stenosis: Severe by echo 3/28 (mean gradient 43mmHg, peak 91.41mmHg, area by VTI 0.94cm2).  - Pt would not wish to under go valve surgery, so will not make referral/consult at this time.  - Avoid over-diuresis/lowering filling pressures too much.   Controlled T2DM: HbA1c 7.5%.  - Hold metformin while admitted - Having postprandial hyperglycemia. Continue home 5u basal insulin, continue mealtime + SSI. Pt's BMI is 32, so  some resistance is anticipated.   - On statin, ASA (home dose is 162 qHS)   Nausea and vomiting: Only XR abnormality is constipation.  - Continue daily bowel regimen, had BM 3/26 per pt after suppository. Abd exam benign, eating well with resolved  N/V. Repeat suppository today. - Note history of esophageal dysphagia/suspected dysfunction/stasis for which [facility] is consulted today. D/w Dr. Randel Pigg.   Recent RSV infection:  - Continue inhaled therapies, no hypoxia.   GERD:  - Continue PPI  Patrecia Pour, MD [facility] [email or web address] 11/12/2022, 1:38 PM

## 2022-11-13 ENCOUNTER — Inpatient Hospital Stay (HOSPITAL_COMMUNITY): Payer: Medicare PPO

## 2022-11-13 DIAGNOSIS — I5032 Chronic diastolic (congestive) heart failure: Secondary | ICD-10-CM | POA: Diagnosis not present

## 2022-11-13 DIAGNOSIS — M009 Pyogenic arthritis, unspecified: Secondary | ICD-10-CM | POA: Diagnosis not present

## 2022-11-13 DIAGNOSIS — E871 Hypo-osmolality and hyponatremia: Secondary | ICD-10-CM | POA: Diagnosis not present

## 2022-11-13 DIAGNOSIS — E1165 Type 2 diabetes mellitus with hyperglycemia: Secondary | ICD-10-CM | POA: Diagnosis not present

## 2022-11-13 LAB — CBC
HCT: 28.5 % — ABNORMAL LOW (ref 36.0–46.0)
Hemoglobin: 8.7 g/dL — ABNORMAL LOW (ref 12.0–15.0)
MCH: 26.8 pg (ref 26.0–34.0)
MCHC: 30.5 g/dL (ref 30.0–36.0)
MCV: 87.7 fL (ref 80.0–100.0)
Platelets: 344 10*3/uL (ref 150–400)
RBC: 3.25 MIL/uL — ABNORMAL LOW (ref 3.87–5.11)
RDW: 16.1 % — ABNORMAL HIGH (ref 11.5–15.5)
WBC: 13.6 10*3/uL — ABNORMAL HIGH (ref 4.0–10.5)
nRBC: 0 % (ref 0.0–0.2)

## 2022-11-13 LAB — URINALYSIS, ROUTINE W REFLEX MICROSCOPIC
Bilirubin Urine: NEGATIVE
Glucose, UA: NEGATIVE mg/dL
Hgb urine dipstick: NEGATIVE
Ketones, ur: NEGATIVE mg/dL
Leukocytes,Ua: NEGATIVE
Nitrite: NEGATIVE
Protein, ur: NEGATIVE mg/dL
Specific Gravity, Urine: 1.011 (ref 1.005–1.030)
pH: 5 (ref 5.0–8.0)

## 2022-11-13 LAB — GLUCOSE, CAPILLARY
Glucose-Capillary: 126 mg/dL — ABNORMAL HIGH (ref 70–99)
Glucose-Capillary: 209 mg/dL — ABNORMAL HIGH (ref 70–99)
Glucose-Capillary: 187 mg/dL — ABNORMAL HIGH (ref 70–99)
Glucose-Capillary: 238 mg/dL — ABNORMAL HIGH (ref 70–99)

## 2022-11-13 LAB — BASIC METABOLIC PANEL
Anion gap: 11 (ref 5–15)
BUN: 25 mg/dL — ABNORMAL HIGH (ref 8–23)
CO2: 31 mmol/L (ref 22–32)
Calcium: 9.3 mg/dL (ref 8.9–10.3)
Chloride: 87 mmol/L — ABNORMAL LOW (ref 98–111)
Creatinine, Ser: 0.76 mg/dL (ref 0.44–1.00)
GFR, Estimated: >60 mL/min (ref >60)
Glucose, Bld: 119 mg/dL — ABNORMAL HIGH (ref 70–99)
Potassium: 5.1 mmol/L (ref 3.5–5.1)
Sodium: 129 mmol/L — ABNORMAL LOW (ref 135–145)

## 2022-11-13 MED ORDER — BISACODYL 10 MG RE SUPP
10.0000 mg | Freq: Once | RECTAL | Status: AC
  Administered 2022-11-13: 10 mg via RECTAL
  Filled 2022-11-13: qty 1

## 2022-11-13 MED ORDER — IPRATROPIUM-ALBUTEROL 0.5-2.5 (3) MG/3ML IN SOLN
3.0000 mL | Freq: Two times a day (BID) | RESPIRATORY_TRACT | Status: DC
  Administered 2022-11-13 – 2022-11-17 (×8): 3 mL via RESPIRATORY_TRACT
  Filled 2022-11-13 (×8): qty 3

## 2022-11-13 MED ORDER — FUROSEMIDE 10 MG/ML IJ SOLN
40.0000 mg | Freq: Three times a day (TID) | INTRAMUSCULAR | Status: AC
  Administered 2022-11-13 (×2): 40 mg via INTRAVENOUS
  Filled 2022-11-13 (×2): qty 4

## 2022-11-13 MED ORDER — ALBUMIN HUMAN 25 % IV SOLN
12.5000 g | Freq: Three times a day (TID) | INTRAVENOUS | Status: AC
  Administered 2022-11-13 (×2): 12.5 g via INTRAVENOUS
  Filled 2022-11-13 (×2): qty 50

## 2022-11-13 NOTE — Progress Notes (Signed)
[facility] PROGRESS NOTE  Heidi Dominguez (DOB: 1921-07-06) [id number] PCP: Lajean Manes, MD  Brief Narrative: Heidi Dominguez is a 89 y.o. female with a history of chronic HFpEF, T2DM, HTN, getting over recent RSV infection, who presented to the ED on 11/03/2022 with right shoulder pain and swelling with leukocytosis, and large fluid collection on CT. This was aspirated with drain placement by IR on 3/23. Cultures have grown E. coli for which ID has been guiding antibiotic therapy. Orthopedics also following. Hospitalization complicated by hyponatremia, SIADH for which nephrology was consulted. Fluid restriction was initiated without significant change, tolvaptan has been dosed per nephrology with limited improvement. Diuresis with furosemide is ongoing.  Subjective: Breathing a bit better, no BM in past 24 hours, breakfast not here yet, but she's eager to eat. No right shoulder pain currently. Spoke with daughter at bedside yesterday afternoon and again this morning by phone.  Objective: BP (!) 149/53 (BP Location: Left Wrist)   Pulse 88   Temp 98.7 F (37.1 C) (Oral)   Resp 18   Ht 5\' 4"  (1.626 m)   Wt 86.5 kg   SpO2 99%   BMI 32.73 kg/m   Gen: No distress, elderly, frail Pulm: Clear anterolaterally, normal rate, some belly breathing  CV: RRR, II/VI holosystolic murmur with blunting S1/S2 GI: Soft, NT, ND, +BS, palpable stool LLQ is not tender.  Neuro: Alert, oriented, interactive. Ext: Warm, no deformities. 2+ pitting LE edema though evidence of improvement compared to prior exams. R shoulder stable, restricted ROM. Skin: No new rashes, lesions or ulcers on visualized skin   Assessment & Plan: Septic arthritis and abscess of right shoulder, prosthetic joint infection: s/p 40cc purulent aspirated fluid +E. coli on 3/23. She has history of E. coli infection of this joint s/p 2.5 years of suppressive ciprofloxacin completed Sept 2022. E. coli isolate here is resistant to  cipro.  - Orthopedics consulted, very poor surgical candidate in this 89yo. Dr. Veverly Fells recommended, and pt/family consented to, trial of aspiration and antibiotics in lieu of open I&D and bead placement. Fortunately she is stabilized and functionally improving. - IR placed lateral 10Fr drain (labeled #1) w/130cc pink purulent fluid output and more medial/anterior 8Fr drain (labeled #2) with 40cc yellow purulent fluid output. Will contact IR prior to DC to remove drains. - ID consulted, continued on ceftriaxone until no further culture growth and transitioned cefadroxil 1g po BID thereafter through 12/15/2022. Will need [facility] follow up (scheduled 4/11 with Dr. Candiss Norse). and indefinite suppression given hardware in place. Note cultures being held x14 days for P. acnes.    Hyponatremia, SIADH: Acute on chronic hypoosmolar hyponatremia, asymptomatic. - Nephrology consult appreciated. Na up to 129 with lasix, suspect hypervolemia contributing. Remains asymptomatic.  - Will repeat lasix + albumin today unless alternative recommendations by nephrology. Plan to continue lasix 40mg  daily after discharge.    Cerumen: No impaction, but would benefit from softening/removal.  - Debrox BID   Acute hypoxic respiratory failure, COPD: Put on O2.  - Diuresis as below - Neb prn - Repeat CXR, wean oxygen as tolerated  Acute on chronic HFpE, HTN: Last Echo 04/09/2015 with EF 60-65%, Grade I DD. She has been managed with HCTZ and low dose lasix. - Stopped HCTZ. Giving lasix IV BID. Cr stable, BUN slightly rising. - Continue BP management with norvasc.   Aortic stenosis: Severe by echo 3/28 (mean gradient 37mmHg, peak 91.49mmHg, area by VTI 0.94cm2).  - Pt would not wish to under go  valve surgery, so will not make referral/consult at this time. D/w pt's daughter as well. - Avoid over-diuresis/lowering filling pressures too much.   Controlled T2DM: HbA1c 7.5%.  - Hold metformin while admitted - Having postprandial  hyperglycemia. Continue home 5u basal insulin, continue mealtime + SSI. Pt's BMI is 32, so some resistance is anticipated.   - On statin, ASA (home dose is 162 qHS)   Nausea and vomiting: Only XR abnormality is constipation.  - Continue daily bowel regimen, repeat suppository today.  - Note history of esophageal dysphagia/suspected dysfunction. GI consulted, barium study pending.  Recent RSV infection:  - Continue inhaled therapies, no hypoxia.   GERD:  - Continue PPI  Patrecia Pour, MD [facility] [email or web address] 11/13/2022, 9:19 AM

## 2022-11-13 NOTE — Progress Notes (Signed)
Patient ID: Heidi Dominguez, female   DOB: 05-19-1921, 89 y.o.   MRN: [id number] [facility] Progress Note   Assessment/ Plan:   1.  Hyponatremia: Acute on chronic hypoosmolar hyponatremia in a hypervolemic patient.  Appears to be from a combination of non-osmolar ADH stimulation from nausea/pain along with hypervolemia in the setting of diastolic heart failure.  Sodium level improving sluggishly with furosemide/tolvaptan.  D/c tolvaptan 3/31; continue furosemide 40 mg IV twice daily for additional volume unloading. 2.  Septic arthritis of right shoulder: Abscess drainage revealed E. coli infection for which she is currently on cefadroxil after earlier being on intravenous antibiotics.  No indication for open drainage per orthopedics. 3.  Chronic diastolic heart failure: Mild/trace lower extremity edema is present for which we will continue loop diuretic and oral fluid restriction. 4.  Hypertension: Blood pressures marginally elevated on amlodipine and furosemide.  Losartan on hold as we attempt to correct hyponatremia.  Subjective:   Problems with postprandial nausea/emesis but feeling a little better.  Comfortable on oxygen via nasal cannula and reports some intermittent cough. Denies f/c.   Objective:   BP (!) 149/53 (BP Location: Left Wrist)   Pulse 88   Temp 98.7 F (37.1 C) (Oral)   Resp 18   Ht 5\' 4"  (1.626 m)   Wt 86.5 kg   SpO2 99%   BMI 32.73 kg/m   Intake/Output Summary (Last 24 hours) at 11/13/2022 1040 Last data filed at 11/13/2022 0826 Gross per 24 hour  Intake 177 ml  Output 1600 ml  Net -1423 ml   Weight change:   Physical Exam: Gen: Appears comfortable but somnolent resting in bed, oxygen via nasal cannula CVS: Pulse regular rhythm, normal rate, S1 and S2 normal Resp: Coarse breath sounds bilaterally with fine rales left base Abd: Soft, obese, nontender, bowel sounds normal Ext: 2+ pitting lower extremity edema.    Imaging: No results  found.  Labs: BMET Recent Labs  Lab 11/07/22 1508 11/08/22 0350 11/08/22 1630 11/09/22 0039 11/10/22 0340 11/11/22 0617 11/12/22 0354 11/13/22 0351  NA 124* 125* 126* 124* 126* 126* 127* 129*  K 4.1 4.1  --  4.2 4.3 4.6 4.7 5.1  CL 86* 86*  --  85* 88* 86* 89* 87*  CO2 28 30  --  26 28 29 29 31   GLUCOSE 184* 157*  --  184* 107* 175* 133* 119*  BUN 8 10  --  11 16 22  25* 25*  CREATININE 0.68 0.62  --  0.66 0.76 0.88 0.85 0.76  CALCIUM 8.5* 8.5*  --  8.8* 8.5* 8.9 8.9 9.3  PHOS  --  2.9  --  2.6 3.2 4.3 3.7  --    CBC Recent Labs  Lab 11/07/22 0333 11/08/22 0350 11/13/22 0351  WBC 13.9* 13.0* 13.6*  HGB 10.2* 9.5* 8.7*  HCT 31.6* 28.7* 28.5*  MCV 85.4 83.7 87.7  PLT 352 365 344    Medications:     acidophilus  1 capsule Oral QHS   amLODipine  10 mg Oral Daily   artificial tears   Both Eyes BID   aspirin EC  162 mg Oral QHS   bisacodyl  10 mg Rectal Once   budesonide  0.5 mg Nebulization BH-qamhs   carbamide peroxide  5 drop Both EARS BID   cefadroxil  1,000 mg Oral BID   dextromethorphan-guaiFENesin  1 tablet Oral BID   diclofenac Sodium  2 g Topical TID   docusate sodium  100 mg  Oral BID   feeding supplement (GLUCERNA SHAKE)  237 mL Oral BID BM   furosemide  40 mg Intravenous Q8H   heparin  5,000 Units Subcutaneous Q8H   insulin aspart  0-20 Units Subcutaneous TID WC   insulin aspart  3 Units Subcutaneous TID WC   insulin detemir  5 Units Subcutaneous Daily   ipratropium-albuterol  3 mL Nebulization BID   loratadine  10 mg Oral QHS   pantoprazole  40 mg Oral Daily   polyethylene glycol  17 g Oral BID   polyvinyl alcohol  2 drop Both Eyes Daily   senna  2 tablet Oral BID   sodium chloride flush  5 mL Intracatheter Q8H

## 2022-11-13 NOTE — Progress Notes (Signed)
Occupational Therapy Treatment Patient Details Name: Heidi Dominguez MRN: [id number] DOB: 09/11/1920 Today's Date: 11/13/2022   History of present illness Heidi Dominguez is a 89 y.o. female admitted on 11/03/2022 with shoulder infection. 2x drains placed. PMHx: arthritis, CHF, CKD, Dyspnea, GERD, HOH, HTN, OA, bilateral shoulder replacements, bil TKR   OT comments  Patient required mod assist to get to EOB and address self feeding while seated on EOB. Patient required min assist to min guard assist for balance due to posterior leaning with min assist to manage utensil due to right shoulder pain. Patient was max assist to return to supine due to assistance needed with BLEs and trunk. Patient would benefit from continued OT treatment to address grooming and functional transfers. Acute OT to continue to follow.    Recommendations for follow up therapy are one component of a multi-disciplinary discharge planning process, led by the attending physician.  Recommendations may be updated based on patient status, additional functional criteria and insurance authorization.    Assistance Recommended at Discharge Frequent or constant Supervision/Assistance  Patient can return home with the following  A lot of help with walking and/or transfers;Two people to help with walking and/or transfers;A lot of help with bathing/dressing/bathroom;Two people to help with bathing/dressing/bathroom;Assistance with cooking/housework;Assistance with feeding;Direct supervision/assist for medications management;Direct supervision/assist for financial management;Assist for transportation;Help with stairs or ramp for entrance   Equipment Recommendations  None recommended by OT    Recommendations for Other Services      Precautions / Restrictions Precautions Precautions: Fall Precaution Comments: 2x drains R shoulder Restrictions Weight Bearing Restrictions: No       Mobility Bed Mobility Overal bed mobility: Needs  Assistance Bed Mobility: Supine to Sit, Sit to Supine     Supine to sit: Mod assist, HOB elevated Sit to supine: Mod assist, Max assist   General bed mobility comments: able to assist with BLEs off of bed and assistance to elevate trunk. Patient required assistance with trunk and BLEs to return to supine    Transfers                         Balance Overall balance assessment: Needs assistance Sitting-balance support: Feet supported, No upper extremity supported Sitting balance-Leahy Scale: Fair Sitting balance - Comments: min assist to min guard for sitting balance while performing self feeding. Postural control: Posterior lean                                 ADL either performed or assessed with clinical judgement   ADL Overall ADL's : Needs assistance/impaired Eating/Feeding: Minimal assistance;Sitting Eating/Feeding Details (indicate cue type and reason): self feeding seated on EOB with min assist to manage utensils, patient able to manage cup Grooming: Minimal assistance;Sitting;Wash/dry face;Wash/dry hands                                 General ADL Comments: tolerated sitting on EOB for self feeding and grooming    Extremity/Trunk Assessment              Vision       Perception     Praxis      Cognition Arousal/Alertness: Awake/alert Behavior During Therapy: WFL for tasks assessed/performed Overall Cognitive Status: No family/caregiver present to determine baseline cognitive functioning  Exercises      Shoulder Instructions       General Comments      Pertinent Vitals/ Pain       Pain Assessment Pain Assessment: Faces Faces Pain Scale: Hurts a little bit Pain Location: R shoulder Pain Descriptors / Indicators: Discomfort, Grimacing Pain Intervention(s): Limited activity within patient's tolerance, Monitored during session, Repositioned  Home Living                                           Prior Functioning/Environment              Frequency  Min 2X/week        Progress Toward Goals  OT Goals(current goals can now be found in the care plan section)  Progress towards OT goals: Progressing toward goals  Acute Rehab OT Goals Patient Stated Goal: get better OT Goal Formulation: With patient Time For Goal Achievement: 11/20/22 Potential to Achieve Goals: Good ADL Goals Pt Will Perform Grooming: with set-up;sitting Pt Will Perform Upper Body Dressing: with min assist;sitting Additional ADL Goal #1: Pt will complete bed mobility with min A as a precursor to ADLs  Plan Discharge plan remains appropriate    Co-evaluation                 AM-PAC OT "6 Clicks" Daily Activity     Outcome Measure   Help from another person eating meals?: A Little Help from another person taking care of personal grooming?: A Little Help from another person toileting, which includes using toliet, bedpan, or urinal?: Total Help from another person bathing (including washing, rinsing, drying)?: A Lot Help from another person to put on and taking off regular upper body clothing?: A Lot Help from another person to put on and taking off regular lower body clothing?: Total 6 Click Score: 12    End of Session    OT Visit Diagnosis: Unsteadiness on feet (R26.81);Other abnormalities of gait and mobility (R26.89);Muscle weakness (generalized) (M62.81);Pain Pain - Right/Left: Right Pain - part of body: Shoulder   Activity Tolerance Patient tolerated treatment well   Patient Left in bed;with call bell/phone within reach;with bed alarm set   Nurse Communication Mobility status        Time: [id number] OT Time Calculation (min): 27 min  Charges: OT General Charges $OT Visit: 1 Visit OT Treatments $Self Care/Home Management : 23-37 mins  Lodema Hong, [place]  Office [place] 11/13/2022, 2:04 PM

## 2022-11-13 NOTE — Progress Notes (Signed)
Received call from RN for PRN tx. RT went to pt bedside at 1440, pt was eating and declined treatment and denied SoB, no tx given at this time. RT available if needed later.

## 2022-11-13 NOTE — Progress Notes (Signed)
[facility] Progress Note  NEL HAVNER 89 y.o. 15-Dec-1920   Subjective: Denies trouble swallowing or abdominal pain. No family in room during my evaluation.  Objective: Vital signs: Vitals:   11/13/22 0823 11/13/22 0900  BP: (!) 149/53   Pulse: 88   Resp: 18   Temp: 98.7 F (37.1 C)   SpO2: 100% 99%    Physical Exam: Gen: lethargic, elderly, well-nourished, no acute distress, pleasant HEENT: anicteric sclera CV: RRR Chest: bilateral expiratory wheezing Abd: soft, nontender, nondistended, +BS Ext: no edema  Lab Results: Recent Labs    11/11/22 0617 11/12/22 0354 11/13/22 0351  NA 126* 127* 129*  K 4.6 4.7 5.1  CL 86* 89* 87*  CO2 29 29 31   GLUCOSE 175* 133* 119*  BUN 22 25* 25*  CREATININE 0.88 0.85 0.76  CALCIUM 8.9 8.9 9.3  PHOS 4.3 3.7  --    Recent Labs    11/11/22 0617 11/12/22 0354  ALBUMIN 1.9* 2.0*   Recent Labs    11/13/22 0351  WBC 13.6*  HGB 8.7*  HCT 28.5*  MCV 87.7  PLT 344      Assessment/Plan: Dysphagia likely multi-factorial. Suspect age-related dysmotility main source and doubt an obstructive process. Barium swallow limited by inability to stand. Her large hiatal hernia likely playing a role as well. Respiratory status is tenuous and would not recommend an EGD due to increased risks of sedation and potential complications from dilation. Consistency of food as tolerated with speech path recs. Would manage conservatively and recommend strict following of speech path recs from 11/06/22. Discussed with daughter, [name] by phone this afternoon.     Lear Ng 11/13/2022, 2:05 PM  Questions please call [phone] ID: [name], female   DOB: 02-14-1921, 89 y.o.   MRN: [id number]

## 2022-11-14 DIAGNOSIS — M009 Pyogenic arthritis, unspecified: Secondary | ICD-10-CM | POA: Diagnosis not present

## 2022-11-14 DIAGNOSIS — E871 Hypo-osmolality and hyponatremia: Secondary | ICD-10-CM | POA: Diagnosis not present

## 2022-11-14 DIAGNOSIS — I5032 Chronic diastolic (congestive) heart failure: Secondary | ICD-10-CM | POA: Diagnosis not present

## 2022-11-14 DIAGNOSIS — E1165 Type 2 diabetes mellitus with hyperglycemia: Secondary | ICD-10-CM | POA: Diagnosis not present

## 2022-11-14 LAB — RENAL FUNCTION PANEL
Albumin: 2.3 g/dL — ABNORMAL LOW (ref 3.5–5.0)
Anion gap: 10 (ref 5–15)
BUN: 23 mg/dL (ref 8–23)
CO2: 32 mmol/L (ref 22–32)
Calcium: 9.2 mg/dL (ref 8.9–10.3)
Chloride: 86 mmol/L — ABNORMAL LOW (ref 98–111)
Creatinine, Ser: 0.77 mg/dL (ref 0.44–1.00)
GFR, Estimated: >60 mL/min (ref >60)
Glucose, Bld: 167 mg/dL — ABNORMAL HIGH (ref 70–99)
Phosphorus: 2.8 mg/dL (ref 2.5–4.6)
Potassium: 4.4 mmol/L (ref 3.5–5.1)
Sodium: 128 mmol/L — ABNORMAL LOW (ref 135–145)

## 2022-11-14 LAB — GLUCOSE, CAPILLARY
Glucose-Capillary: 206 mg/dL — ABNORMAL HIGH (ref 70–99)
Glucose-Capillary: 217 mg/dL — ABNORMAL HIGH (ref 70–99)
Glucose-Capillary: 266 mg/dL — ABNORMAL HIGH (ref 70–99)
Glucose-Capillary: 210 mg/dL — ABNORMAL HIGH (ref 70–99)

## 2022-11-14 MED ORDER — FUROSEMIDE 10 MG/ML IJ SOLN
40.0000 mg | Freq: Two times a day (BID) | INTRAMUSCULAR | Status: DC
  Administered 2022-11-14: 40 mg via INTRAVENOUS
  Filled 2022-11-14: qty 4

## 2022-11-14 MED ORDER — ALBUTEROL SULFATE (2.5 MG/3ML) 0.083% IN NEBU
5.0000 mg | INHALATION_SOLUTION | Freq: Once | RESPIRATORY_TRACT | Status: AC
  Administered 2022-11-14: 5 mg via RESPIRATORY_TRACT
  Filled 2022-11-14: qty 6

## 2022-11-14 MED ORDER — FUROSEMIDE 10 MG/ML IJ SOLN
40.0000 mg | Freq: Once | INTRAMUSCULAR | Status: AC
  Administered 2022-11-14: 40 mg via INTRAVENOUS
  Filled 2022-11-14: qty 4

## 2022-11-14 NOTE — Progress Notes (Signed)
Physical Therapy Treatment Patient Details Name: Heidi Dominguez MRN: [id number] DOB: Jun 16, 1921 Today's Date: 11/14/2022   History of Present Illness Heidi Dominguez is a 89 y.o. female admitted on 11/03/2022 with shoulder infection. 2x drains placed. PMHx: arthritis, CHF, CKD, Dyspnea, GERD, HOH, HTN, OA, bilateral shoulder replacements, bil TKR    PT Comments    Pt reporting being more short of breath vs normal, otherwise states she is having no pain. Pt requiring much more physical assist for to/from EOB this date, up to max +2 at this time. Once EOB pt complaining of worsening dyspnea and could not speak in fluent sentences, SPO2 81% on 2LO2 when assessed EOB. PT increased O2 to 4LO2, RN called to room to assess. Pt left on 4LO2 per RN request and satting 96%. Pt also soiled bed with large BM, requiring PT assist for clean up. PT to continue to follow.       Recommendations for follow up therapy are one component of a multi-disciplinary discharge planning process, led by the attending physician.  Recommendations may be updated based on patient status, additional functional criteria and insurance authorization.  Follow Up Recommendations       Assistance Recommended at Discharge Frequent or constant Supervision/Assistance  Patient can return home with the following Two people to help with walking and/or transfers;Two people to help with bathing/dressing/bathroom   Equipment Recommendations  Hospital bed;Other (comment) (hoyer)    Recommendations for Other Services       Precautions / Restrictions Precautions Precautions: Fall Precaution Comments: 2x drains R shoulder Restrictions Weight Bearing Restrictions: No     Mobility  Bed Mobility Overal bed mobility: Needs Assistance Bed Mobility: Supine to Sit, Sit to Supine, Rolling Rolling: Min assist   Supine to sit: Max assist, +2 for physical assistance Sit to supine: Max assist, +2 for physical assistance   General bed  mobility comments: max +2 for moving to/from EOB, boost upon return to supine with trendelenburg function. Rolling bilat for pericare    Transfers Overall transfer level: Needs assistance                 General transfer comment: unable given pt fatigue    Ambulation/Gait                   Stairs             Wheelchair Mobility    Modified Rankin (Stroke Patients Only)       Balance Overall balance assessment: Needs assistance Sitting-balance support: Feet supported, No upper extremity supported Sitting balance-Leahy Scale: Fair   Postural control: Posterior lean                                  Cognition Arousal/Alertness: Awake/alert Behavior During Therapy: WFL for tasks assessed/performed Overall Cognitive Status: No family/caregiver present to determine baseline cognitive functioning                                 General Comments: follows commands, limited by [facility]        Exercises      General Comments General comments (skin integrity, edema, etc.): pt limited by dyspnea at rest and on exertion, SPO2 81% on 2LO2 when assessed EOB. PT increased O2 to 4LO2, RN called to room to assess. Pt left on 4LO2 per RN request and satting  96%.      Pertinent Vitals/Pain Pain Assessment Pain Assessment: Faces Faces Pain Scale: Hurts little more Pain Location: discomfort, generalized Pain Descriptors / Indicators: Discomfort, Grimacing Pain Intervention(s): Limited activity within patient's tolerance, Monitored during session, Repositioned    Home Living                          Prior Function            PT Goals (current goals can now be found in the care plan section) Acute Rehab PT Goals Patient Stated Goal: To continue to work with PT and get stronger PT Goal Formulation: With patient Time For Goal Achievement: 11/20/22 Potential to Achieve Goals: Fair Progress towards PT goals: Not progressing  toward goals - comment    Frequency    Min 2X/week      PT Plan Current plan remains appropriate    Co-evaluation              AM-PAC PT "6 Clicks" Mobility   Outcome Measure  Help needed turning from your back to your side while in a flat bed without using bedrails?: A Lot Help needed moving from lying on your back to sitting on the side of a flat bed without using bedrails?: Total Help needed moving to and from a bed to a chair (including a wheelchair)?: Total Help needed standing up from a chair using your arms (e.g., wheelchair or bedside chair)?: Total Help needed to walk in hospital room?: Total Help needed climbing 3-5 steps with a railing? : Total 6 Click Score: 7    End of Session Equipment Utilized During Treatment: Oxygen Activity Tolerance: Patient limited by fatigue Patient left: in bed;with call bell/phone within reach;with bed alarm set Nurse Communication: Mobility status PT Visit Diagnosis: Other abnormalities of gait and mobility (R26.89);Muscle weakness (generalized) (M62.81)     Time: [id number] PT Time Calculation (min) (ACUTE ONLY): 44 min  Charges:  $Therapeutic Activity: 23-37 mins                     [name], PT DPT Acute Rehabilitation Services Pager [phone]  Office [phone]    [name] 11/14/2022, 4:58 PM

## 2022-11-14 NOTE — Progress Notes (Signed)
[facility] PROGRESS NOTE  Heidi Dominguez (DOB: 21-Jul-1921) [id number] PCP: Lajean Manes, MD  Brief Narrative: Heidi Dominguez is a 89 y.o. female with a history of chronic HFpEF, T2DM, HTN, getting over recent RSV infection, who presented to the ED on 11/03/2022 with right shoulder pain and swelling with leukocytosis, and large fluid collection on CT. This was aspirated with drain placement by IR on 3/23. Cultures have grown E. coli for which ID has been guiding antibiotic therapy. Orthopedics also following. Hospitalization complicated by hyponatremia, SIADH for which nephrology was consulted. Fluid restriction was initiated without significant change, tolvaptan has been dosed per nephrology with limited improvement. Diuresis with furosemide is ongoing.  Subjective: She is feeding herself this morning, denies any shortness of breath at this time but does look to be breathing quickly. Denies having pain. Daughter by phone reports patient was much more drowsy yesterday and is more drowsy today as well.  Objective: BP (!) 134/58 (BP Location: Right Wrist)   Pulse 86   Temp 98.7 F (37.1 C)   Resp 18   Ht 5\' 4"  (1.626 m)   Wt 86.5 kg   SpO2 96%   BMI 32.73 kg/m   Gen: Elderly frail female in no distress Pulm: No wheezes anterolaterally, rate is normal.   CV: RRR, III/VI holosystolic murmur stable, dependent edema slightly decreased but remains in legs.  GI: Soft, NT, ND, +BS Neuro: Alert and moves all extremities, normal speech, stable HOH. No new focal deficits. Ext: Warm, no deformities. Skin: R shoulder with drain sites c/d/I, output becoming more serous than sanguinous. No new rashes, lesions or ulcers on visualized skin   Assessment & Plan: Septic arthritis and abscess of right shoulder, prosthetic joint infection: s/p 40cc purulent aspirated fluid +E. coli on 3/23. She has history of E. coli infection of this joint s/p 2.5 years of suppressive ciprofloxacin completed  Sept 2022. E. coli isolate here is resistant to cipro.  - Orthopedics consulted, very poor surgical candidate in this 89yo. Dr. Veverly Fells recommended, and pt/family consented to, trial of aspiration and antibiotics in lieu of open I&D and bead placement. Fortunately she is stabilized and functionally improving. - IR placed lateral 10Fr drain (labeled #1) w/130cc pink purulent fluid output and more medial/anterior 8Fr drain (labeled #2) with 40cc yellow purulent fluid output. Will contact IR prior to DC to remove drains. - ID consulted, continued on ceftriaxone until no further culture growth and transitioned cefadroxil 1g po BID thereafter through 12/15/2022. Will need [facility] follow up (scheduled 4/11 with Dr. Candiss Norse). and indefinite suppression given hardware in place. Note cultures being held x14 days for P. acnes.    Hyponatremia, SIADH: Acute on chronic hypoosmolar hyponatremia, asymptomatic. - Nephrology consult appreciated. Na up to 129 with lasix, suspect hypervolemia contributing, now back to 128. Remains asymptomatic.  - Will repeat lasix today, remains volume up. Nephrology considering urea vs. salt tabs. Because I suspect inadequate solute intake is contributing, ok'ed her to have food from home as tolerated.  Cerumen: No impaction, but would benefit from softening/removal.  - Debrox BID   Acute hypoxic respiratory failure, COPD:   - Diuresis as below - Neb prn - Repeat CXR with stable interstitial edema  Acute on chronic HFpE, HTN: Last Echo 04/09/2015 with EF 60-65%, Grade I DD. She has been managed with HCTZ and low dose lasix. - Stopped HCTZ. Giving lasix IV BID. Cr stable, BUN up appropriately. - Continue BP management with norvasc.   Aortic stenosis: Severe  by echo 3/28 (mean gradient 46mmHg, peak 91.88mmHg, area by VTI 0.94cm2).  - Pt would not wish to under go valve surgery, so will not make referral/consult at this time. D/w pt's daughter as well. - Avoid over-diuresis/lowering  filling pressures too much.   Acute delirium: Pt with some drowsiness developing over past couple days.  - Check labs, delirium precautions reviewed, can pursue CT head if focal deficit develops.   Nausea and vomiting: Only XR abnormality is constipation.  - Continue daily bowel regimen, repeat suppository today.  - Note history of esophageal dysphagia/suspected dysfunction. GI consulted, barium study revealed focal distal esophageal narrowing to 5-60mm, patulous esophagus with prominent dysmotility and large hiatal hernia.  Controlled T2DM: HbA1c 7.5%.  - Hold metformin while admitted - Continue home 5u basal insulin, continue mealtime + SSI. Pt's BMI is 32, so some resistance is anticipated.   - On statin, ASA (home dose is 162 qHS)   Recent RSV infection:  - Continue inhaled therapies, no hypoxia.   GERD:  - Continue PPI  Patrecia Pour, MD [facility] [email or web address] 11/14/2022, 10:54 AM

## 2022-11-14 NOTE — Progress Notes (Signed)
Patient ID: Heidi Dominguez, female   DOB: [birth year], 89 y.o.   MRN: [id number] [facility] Progress Note   Assessment/ Plan:   1.  Hyponatremia: Acute on chronic hypoosmolar hyponatremia in a hypervolemic patient.  Appears to be from a combination of non-osmolar ADH stimulation from nausea/pain along with hypervolemia in the setting of diastolic heart failure.  Sodium level improving sluggishly with furosemide/tolvaptan.  D/c tolvaptan 3/31; continue furosemide 40 mg IV twice daily for additional volume unloading. Still has some e/o volume overload but also not eating much. Have to wonder if solute intake is an issue with this pt. Will continue furosemide for now but if Na decr tomorrow then will give a trial with NaCl vs UreNa; would prefer not to use NaCl with diastolic HF. 2.  Septic arthritis of right shoulder: Abscess drainage revealed E. coli infection for which she is currently on cefadroxil after earlier being on intravenous antibiotics.  No indication for open drainage per orthopedics. 3.  Chronic diastolic heart failure: Mild/trace lower extremity edema is present for which we will continue loop diuretic and oral fluid restriction. 4.  Hypertension: Blood pressures marginally elevated on amlodipine and furosemide.  Losartan on hold as we attempt to correct hyponatremia.  Subjective:   Has had problems with postprandial nausea/emesis but this am no nausea or pain. Comfortable on oxygen via nasal cannula and reports some intermittent cough. Denies f/c.   Objective:   BP (!) 134/58 (BP Location: Right Wrist)   Pulse 86   Temp 98.7 F (37.1 C)   Resp 18   Ht 5\' 4"  (1.626 m)   Wt 86.5 kg   SpO2 96%   BMI 32.73 kg/m   Intake/Output Summary (Last 24 hours) at 11/14/2022 0943 Last data filed at 11/13/2022 1900 Gross per 24 hour  Intake 256.47 ml  Output 800 ml  Net -543.53 ml   Weight change:   Physical Exam: Gen: Appears comfortable but somnolent resting in bed,  oxygen via nasal cannula CVS: Pulse regular rhythm, normal rate, S1 and S2 normal Resp: Coarse breath sounds bilaterally with fine rales left base Abd: Soft, obese, nontender, bowel sounds normal Ext: tr-1+ pitting lower extremity edema.    Imaging: DG ESOPHAGUS W SINGLE CM (SOL OR THIN BA)  Result Date: 11/13/2022 CLINICAL DATA:  Provided history: Vomiting. Swallowing air. Additional history provided: History of esophageal dysphagia/dysfunction. EXAM: ESOPHAGUS/BARIUM SWALLOW/TABLET STUDY TECHNIQUE: A single contrast examination was performed using thin liquid barium. The exam was performed by Narda Rutherford, NP, and was supervised and interpreted by Dr. Kellie Simmering. FLUOROSCOPY: Fluoroscopy time: 1 minute, 12 seconds (17.8 mGy). COMPARISON:  Modified barium swallow 07/20/2022. Upper GI series 06/20/2017. Esophagram 03/27/2008. FINDINGS: Limited study due to the patient's inability to stand and limited ability to reposition on the fluoroscopy table. Additionally, a 13 mm barium tablet was not administered (nursing staff reports the patient is unable to swallow pills unless crushed). Mildly patulous esophagus. Focal narrowing of the distal esophagus compatible with stricture, similar to the prior upper GI series of 06/20/2017. It is estimated at the esophagus is narrowed to 5-7 mm at this site. Prominent intermittent esophageal dysmotility with tertiary contractions. Redemonstrated large hiatal hernia. No gastroesophageal reflux was observed. IMPRESSION: 1. Limited examination due to the patient's inability to stand and limited ability to reposition on the fluoroscopy table. Additionally, a 13 mm barium tablet was not administered (nursing staff reports the patient is unable to swallow pills unless crushed). 2. Focal narrowing of the distal  esophagus compatible with stricture, similar to the prior upper GI series 06/20/2017. It is estimated that the esophagus is narrowed to 5-7 mm at this site. 3. Mildly  patulous esophagus elsewhere. 4. Prominent esophageal dysmotility with tertiary contractions. 5. Redemonstrated large hiatal hernia. Electronically Signed   By: Kellie Simmering D.O.   On: 11/13/2022 12:15   DG CHEST PORT 1 VIEW  Result Date: 11/13/2022 CLINICAL DATA:  Acute respiratory failure with hypoxia. EXAM: PORTABLE CHEST 1 VIEW COMPARISON:  11/11/2022. FINDINGS: Low lung volumes accentuate the pulmonary vasculature and cardiomediastinal silhouette. Unchanged mild interstitial pulmonary edema and small bilateral pleural effusions. Unchanged retrocardiac opacity, likely atelectasis. Stable cardiac and mediastinal contours. No pneumothorax. IMPRESSION: Unchanged mild interstitial pulmonary edema and small bilateral pleural effusions. Electronically Signed   By: Emmit Alexanders M.D.   On: 11/13/2022 10:55    Labs: BMET Recent Labs  Lab 11/08/22 0350 11/08/22 1630 11/09/22 0039 11/10/22 0340 11/11/22 0617 11/12/22 0354 11/13/22 0351 11/14/22 0313  NA 125* 126* 124* 126* 126* 127* 129* 128*  K 4.1  --  4.2 4.3 4.6 4.7 5.1 4.4  CL 86*  --  85* 88* 86* 89* 87* 86*  CO2 30  --  26 28 29 29 31  32  GLUCOSE 157*  --  184* 107* 175* 133* 119* 167*  BUN 10  --  11 16 22  25* 25* 23  CREATININE 0.62  --  0.66 0.76 0.88 0.85 0.76 0.77  CALCIUM 8.5*  --  8.8* 8.5* 8.9 8.9 9.3 9.2  PHOS 2.9  --  2.6 3.2 4.3 3.7  --  2.8   CBC Recent Labs  Lab 11/08/22 0350 11/13/22 0351  WBC 13.0* 13.6*  HGB 9.5* 8.7*  HCT 28.7* 28.5*  MCV 83.7 87.7  PLT 365 344    Medications:     acidophilus  1 capsule Oral QHS   amLODipine  10 mg Oral Daily   artificial tears   Both Eyes BID   aspirin EC  162 mg Oral QHS   budesonide  0.5 mg Nebulization BH-qamhs   carbamide peroxide  5 drop Both EARS BID   cefadroxil  1,000 mg Oral BID   dextromethorphan-guaiFENesin  1 tablet Oral BID   diclofenac Sodium  2 g Topical TID   docusate sodium  100 mg Oral BID   feeding supplement (GLUCERNA SHAKE)  237 mL Oral BID  BM   furosemide  40 mg Intravenous BID   heparin  5,000 Units Subcutaneous Q8H   insulin aspart  0-20 Units Subcutaneous TID WC   insulin aspart  3 Units Subcutaneous TID WC   insulin detemir  5 Units Subcutaneous Daily   ipratropium-albuterol  3 mL Nebulization BID   loratadine  10 mg Oral QHS   pantoprazole  40 mg Oral Daily   polyethylene glycol  17 g Oral BID   polyvinyl alcohol  2 drop Both Eyes Daily   senna  2 tablet Oral BID   sodium chloride flush  5 mL Intracatheter Q8H

## 2022-11-14 NOTE — Progress Notes (Signed)
Initial Nutrition Assessment  DOCUMENTATION CODES:   Obesity unspecified  INTERVENTION:  - Continue Glucerna Shake po BID, each supplement provides 220 kcal and 10 grams of protein.   NUTRITION DIAGNOSIS:   Inadequate oral intake related to poor appetite as evidenced by per patient/family report.  GOAL:   Patient will meet greater than or equal to 90% of their needs  MONITOR:   PO intake  REASON FOR ASSESSMENT:   Consult Assessment of nutrition requirement/status  ASSESSMENT:   89 y.o. female admits related to swelling of the right shoulder and pain when moving. PMH includes: pancreatitis, CHF, CKD, GERD. Pt is currently receiving medical management related to septic arthritis of right shoulder.  Meds reviewed: risaquad, sliding scale insulin, colace, miralax, senokot. Labs reviewed: Na low.   The pt continues with good PO intakes. Per record, pt has been eating 80-100% of her meals and taking Glucerna shakes. RD will continue to monitor PO intakes.   NUTRITION - FOCUSED PHYSICAL EXAM:  WDL - no wasting noted.   Diet Order:   Diet Order             Diet Carb Modified Fluid consistency: Thin; Room service appropriate? Yes with Assist; Fluid restriction: 1500 mL Fluid  Diet effective now                   EDUCATION NEEDS:   Not appropriate for education at this time  Skin:  Skin Assessment: Reviewed RN Assessment  Last BM:  4/1 - type 4  Height:   Ht Readings from Last 1 Encounters:  11/03/22 5\' 4"  (1.626 m)    Weight:   Wt Readings from Last 1 Encounters:  11/03/22 86.5 kg    Ideal Body Weight:     BMI:  Body mass index is 32.73 kg/m.  Estimated Nutritional Needs:   Kcal:  1730-2160 kcals  Protein:  85-105 gm  Fluid:  >/= 1.7 L  Thalia Bloodgood, RD, LDN, CNSC.

## 2022-11-15 DIAGNOSIS — D649 Anemia, unspecified: Secondary | ICD-10-CM | POA: Insufficient documentation

## 2022-11-15 DIAGNOSIS — R410 Disorientation, unspecified: Secondary | ICD-10-CM | POA: Insufficient documentation

## 2022-11-15 DIAGNOSIS — I5033 Acute on chronic diastolic (congestive) heart failure: Secondary | ICD-10-CM | POA: Insufficient documentation

## 2022-11-15 DIAGNOSIS — R112 Nausea with vomiting, unspecified: Secondary | ICD-10-CM | POA: Insufficient documentation

## 2022-11-15 DIAGNOSIS — E669 Obesity, unspecified: Secondary | ICD-10-CM | POA: Insufficient documentation

## 2022-11-15 DIAGNOSIS — T8450XA Infection and inflammatory reaction due to unspecified internal joint prosthesis, initial encounter: Secondary | ICD-10-CM | POA: Diagnosis not present

## 2022-11-15 LAB — RENAL FUNCTION PANEL
Albumin: 2.2 g/dL — ABNORMAL LOW (ref 3.5–5.0)
Anion gap: 8 (ref 5–15)
BUN: 23 mg/dL (ref 8–23)
CO2: 34 mmol/L — ABNORMAL HIGH (ref 22–32)
Calcium: 9.2 mg/dL (ref 8.9–10.3)
Chloride: 86 mmol/L — ABNORMAL LOW (ref 98–111)
Creatinine, Ser: 0.8 mg/dL (ref 0.44–1.00)
GFR, Estimated: >60 mL/min (ref >60)
Glucose, Bld: 158 mg/dL — ABNORMAL HIGH (ref 70–99)
Phosphorus: 2.8 mg/dL (ref 2.5–4.6)
Potassium: 4.4 mmol/L (ref 3.5–5.1)
Sodium: 128 mmol/L — ABNORMAL LOW (ref 135–145)

## 2022-11-15 LAB — GLUCOSE, CAPILLARY
Glucose-Capillary: 168 mg/dL — ABNORMAL HIGH (ref 70–99)
Glucose-Capillary: 269 mg/dL — ABNORMAL HIGH (ref 70–99)
Glucose-Capillary: 180 mg/dL — ABNORMAL HIGH (ref 70–99)
Glucose-Capillary: 104 mg/dL — ABNORMAL HIGH (ref 70–99)

## 2022-11-15 LAB — CBC WITH DIFFERENTIAL/PLATELET
Abs Immature Granulocytes: 0.07 10*3/uL (ref 0.00–0.07)
Basophils Absolute: 0 10*3/uL (ref 0.0–0.1)
Basophils Relative: 0 %
Eosinophils Absolute: 0.2 10*3/uL (ref 0.0–0.5)
Eosinophils Relative: 2 %
HCT: 27.7 % — ABNORMAL LOW (ref 36.0–46.0)
Hemoglobin: 8.8 g/dL — ABNORMAL LOW (ref 12.0–15.0)
Immature Granulocytes: 1 %
Lymphocytes Relative: 5 %
Lymphs Abs: 0.6 10*3/uL — ABNORMAL LOW (ref 0.7–4.0)
MCH: 27.5 pg (ref 26.0–34.0)
MCHC: 31.8 g/dL (ref 30.0–36.0)
MCV: 86.6 fL (ref 80.0–100.0)
Monocytes Absolute: 1.2 10*3/uL — ABNORMAL HIGH (ref 0.1–1.0)
Monocytes Relative: 11 %
Neutro Abs: 8.7 10*3/uL — ABNORMAL HIGH (ref 1.7–7.7)
Neutrophils Relative %: 81 %
Platelets: 343 10*3/uL (ref 150–400)
RBC: 3.2 MIL/uL — ABNORMAL LOW (ref 3.87–5.11)
RDW: 16.3 % — ABNORMAL HIGH (ref 11.5–15.5)
WBC: 10.7 10*3/uL — ABNORMAL HIGH (ref 4.0–10.5)
nRBC: 0 % (ref 0.0–0.2)

## 2022-11-15 LAB — AMMONIA: Ammonia: 30 umol/L (ref 9–35)

## 2022-11-15 MED ORDER — FUROSEMIDE 20 MG PO TABS: 20.0000 mg | ORAL_TABLET | Freq: Every day | ORAL | Status: DC

## 2022-11-15 MED ORDER — CEFADROXIL 500 MG PO CAPS: 1000.0000 mg | ORAL_CAPSULE | Freq: Two times a day (BID) | ORAL | Status: DC

## 2022-11-15 MED ORDER — FUROSEMIDE 10 MG/ML IJ SOLN
40.0000 mg | Freq: Once | INTRAMUSCULAR | Status: AC
  Administered 2022-11-15: 40 mg via INTRAVENOUS
  Filled 2022-11-15: qty 4

## 2022-11-15 NOTE — Progress Notes (Signed)
Patient ID: Heidi Dominguez, female   DOB: 1920-11-12, 89 y.o.   MRN: [id number] [facility] Progress Note   Assessment/ Plan:   1.  Hyponatremia: Acute on chronic hypoosmolar hyponatremia in a hypervolemic patient.  Appears to be from a combination of non-osmolar ADH stimulation from nausea/pain along with hypervolemia in the setting of diastolic heart failure.  Sodium level improving sluggishly with furosemide/tolvaptan.  D/c tolvaptan 3/31; continue furosemide 40 mg IV twice daily for additional volume unloading. Still has some e/o volume overload but also not eating much. Have to wonder if solute intake is an issue with this pt.   Would  continue furosemide for now; stable stable and she may have a reset osmostat.  2.  Septic arthritis of right shoulder: Abscess drainage revealed E. coli infection for which she is currently on cefadroxil after earlier being on intravenous antibiotics.  No indication for open drainage per orthopedics. 3.  Chronic diastolic heart failure: Mild/trace lower extremity edema is present for which we will continue loop diuretic and oral fluid restriction. 4.  Hypertension: Blood pressures marginally elevated on amlodipine and furosemide.  Losartan on hold as we attempt to correct hyponatremia.  Subjective:   Has had problems with postprandial nausea/emesis but this am no nausea or pain. But she has a bag that is empty bedside.  Comfortable on oxygen via nasal cannula and reports some intermittent cough. Denies f/c.   Objective:   BP (!) 149/60 (BP Location: Left Wrist)   Pulse 88   Temp 97.8 F (36.6 C)   Resp 14   Ht 5\' 4"  (1.626 m)   Wt 86.5 kg   SpO2 99%   BMI 32.73 kg/m   Intake/Output Summary (Last 24 hours) at 11/15/2022 0848 Last data filed at 11/15/2022 0428 Gross per 24 hour  Intake --  Output 650 ml  Net -650 ml   Weight change:   Physical Exam: Gen: Appears comfortable but somnolent resting in bed, oxygen via nasal cannula;  easily arousable, pleasant and appropriate CVS: Pulse regular rhythm, normal rate, S1 and S2 normal Resp: Coarse breath sounds bilaterally with fine rales left base Abd: Soft, obese, nontender, bowel sounds normal Ext: tr-1+ pitting lower extremity edema.    Imaging: DG ESOPHAGUS W SINGLE CM (SOL OR THIN BA)  Result Date: 11/13/2022 CLINICAL DATA:  Provided history: Vomiting. Swallowing air. Additional history provided: History of esophageal dysphagia/dysfunction. EXAM: ESOPHAGUS/BARIUM SWALLOW/TABLET STUDY TECHNIQUE: A single contrast examination was performed using thin liquid barium. The exam was performed by Narda Rutherford, NP, and was supervised and interpreted by Dr. Kellie Simmering. FLUOROSCOPY: Fluoroscopy time: 1 minute, 12 seconds (17.8 mGy). COMPARISON:  Modified barium swallow 07/20/2022. Upper GI series 06/20/2017. Esophagram 03/27/2008. FINDINGS: Limited study due to the patient's inability to stand and limited ability to reposition on the fluoroscopy table. Additionally, a 13 mm barium tablet was not administered (nursing staff reports the patient is unable to swallow pills unless crushed). Mildly patulous esophagus. Focal narrowing of the distal esophagus compatible with stricture, similar to the prior upper GI series of 06/20/2017. It is estimated at the esophagus is narrowed to 5-7 mm at this site. Prominent intermittent esophageal dysmotility with tertiary contractions. Redemonstrated large hiatal hernia. No gastroesophageal reflux was observed. IMPRESSION: 1. Limited examination due to the patient's inability to stand and limited ability to reposition on the fluoroscopy table. Additionally, a 13 mm barium tablet was not administered (nursing staff reports the patient is unable to swallow pills unless crushed). 2. Focal  narrowing of the distal esophagus compatible with stricture, similar to the prior upper GI series 06/20/2017. It is estimated that the esophagus is narrowed to 5-7 mm at this  site. 3. Mildly patulous esophagus elsewhere. 4. Prominent esophageal dysmotility with tertiary contractions. 5. Redemonstrated large hiatal hernia. Electronically Signed   By: Kellie Simmering D.O.   On: 11/13/2022 12:15   DG CHEST PORT 1 VIEW  Result Date: 11/13/2022 CLINICAL DATA:  Acute respiratory failure with hypoxia. EXAM: PORTABLE CHEST 1 VIEW COMPARISON:  11/11/2022. FINDINGS: Low lung volumes accentuate the pulmonary vasculature and cardiomediastinal silhouette. Unchanged mild interstitial pulmonary edema and small bilateral pleural effusions. Unchanged retrocardiac opacity, likely atelectasis. Stable cardiac and mediastinal contours. No pneumothorax. IMPRESSION: Unchanged mild interstitial pulmonary edema and small bilateral pleural effusions. Electronically Signed   By: Emmit Alexanders M.D.   On: 11/13/2022 10:55    Labs: BMET Recent Labs  Lab 11/09/22 0039 11/10/22 0340 11/11/22 0617 11/12/22 0354 11/13/22 0351 11/14/22 0313 11/15/22 0355  NA 124* 126* 126* 127* 129* 128* 128*  K 4.2 4.3 4.6 4.7 5.1 4.4 4.4  CL 85* 88* 86* 89* 87* 86* 86*  CO2 26 28 29 29 31  32 34*  GLUCOSE 184* 107* 175* 133* 119* 167* 158*  BUN 11 16 22  25* 25* 23 23  CREATININE 0.66 0.76 0.88 0.85 0.76 0.77 0.80  CALCIUM 8.8* 8.5* 8.9 8.9 9.3 9.2 9.2  PHOS 2.6 3.2 4.3 3.7  --  2.8 2.8   CBC Recent Labs  Lab 11/13/22 0351 11/15/22 0355  WBC 13.6* 10.7*  NEUTROABS  --  8.7*  HGB 8.7* 8.8*  HCT 28.5* 27.7*  MCV 87.7 86.6  PLT 344 343    Medications:     acidophilus  1 capsule Oral QHS   amLODipine  10 mg Oral Daily   artificial tears   Both Eyes BID   aspirin EC  162 mg Oral QHS   budesonide  0.5 mg Nebulization BH-qamhs   carbamide peroxide  5 drop Both EARS BID   cefadroxil  1,000 mg Oral BID   dextromethorphan-guaiFENesin  1 tablet Oral BID   diclofenac Sodium  2 g Topical TID   docusate sodium  100 mg Oral BID   feeding supplement (GLUCERNA SHAKE)  237 mL Oral BID BM   heparin  5,000  Units Subcutaneous Q8H   insulin aspart  0-20 Units Subcutaneous TID WC   insulin aspart  3 Units Subcutaneous TID WC   insulin detemir  5 Units Subcutaneous Daily   ipratropium-albuterol  3 mL Nebulization BID   loratadine  10 mg Oral QHS   pantoprazole  40 mg Oral Daily   polyethylene glycol  17 g Oral BID   polyvinyl alcohol  2 drop Both Eyes Daily   senna  2 tablet Oral BID   sodium chloride flush  5 mL Intracatheter Q8H

## 2022-11-15 NOTE — TOC Progression Note (Signed)
Transition of Care [facility]) - Progression Note    Patient Details  Name: Heidi Dominguez MRN: [id number] Date of Birth: [birth year]  Transition of Care [facility]) CM/SW Contact  [name], [place] Phone Number: 11/15/2022, 1:03 PM  Clinical Narrative:  Elpidio Eric- family member [phone] if pt's dtr is unavailable at time of dc.   Expected Discharge Plan: [place] Barriers to Discharge: Continued Medical Work up  Expected Discharge Plan and Services In-house Referral: Clinical Social Work   Post Acute Care Choice: [place] Living arrangements for the past 2 months: [place]) Expected Discharge Date: 11/15/22                                     Social Determinants of Health (SDOH) Interventions SDOH Screenings   Food Insecurity: No Food Insecurity (11/03/2022)  Housing: Low Risk  (11/03/2022)  Transportation Needs: No Transportation Needs (11/03/2022)  Utilities: Not At Risk (11/03/2022)  Depression (PHQ2-9): Low Risk  (02/09/2021)  Tobacco Use: Low Risk  (11/04/2022)    Readmission Risk Interventions     No data to display

## 2022-11-15 NOTE — Progress Notes (Signed)
Referring Physician(s): Gilberto Better  Supervising Physician: Corrie Mckusick  Patient Status:  [facility] - In-pt  Chief Complaint: right shoulder periprosthetic fluid collection s/p drain placement x2   Subjective:  Pt sitting up in bed.  Just finished breakfast.  Nearing discharge. Per RN, minimal output from drains over the weekend.   Allergies: Augmentin [amoxicillin-pot clavulanate], Bactrim [sulfamethoxazole-trimethoprim], Calan [verapamil], Catapres [clonidine hcl], Codeine, Hydrocodone, Other, Sulfa antibiotics, Zestril [lisinopril], and Azulfidine [sulfasalazine]  Medications: Prior to Admission medications   Medication Sig Start Date End Date Taking? Authorizing Provider  acetaminophen (TYLENOL) 325 MG tablet Take 650 mg by mouth 3 (three) times daily.   Yes [provider]  albuterol (VENTOLIN HFA) 108 (90 Base) MCG/ACT inhaler Inhale 2 puffs into the lungs every 6 (six) hours as needed for wheezing or shortness of breath.   Yes [provider]  amLODipine (NORVASC) 10 MG tablet Take 10 mg by mouth daily. HOLD if SBP < 110.   Yes [provider]  aspirin EC 81 MG tablet Take 162 mg by mouth at bedtime.   Yes [provider]  budesonide (PULMICORT) 0.5 MG/2ML nebulizer solution Take 2 mLs (0.5 mg total) by nebulization in the morning and at bedtime. 10/16/22  Yes Cobb, Karie Schwalbe, NP  Cholecalciferol (VITAMIN D3) 25 MCG (1000 UT) capsule Take 1,000 Units by mouth every Monday.   Yes [provider]  Cinnamon 500 MG capsule Take 500 mg by mouth daily.   Yes [provider]  dextromethorphan-guaiFENesin (MUCINEX DM) 30-600 MG 12hr tablet Take 1 tablet by mouth 2 (two) times daily. 10/16/22  Yes Cobb, Karie Schwalbe, NP  diclofenac Sodium (VOLTAREN ARTHRITIS PAIN) 1 % GEL Apply 2 g topically 3 (three) times daily. To both shoulders.   Yes [provider]  furosemide (LASIX) 20 MG tablet Take 0.5 tablets (10 mg total) by mouth  daily for 10 days. Patient taking differently: Take 20 mg by mouth daily. 10/26/22 11/05/22 Yes Tacy Learn, PA-C  hypromellose (GENTEAL SEVERE) 0.3 % GEL ophthalmic ointment Place 1 Application into both eyes 2 (two) times daily.   Yes [provider]  insulin lispro (HUMALOG) 100 UNIT/ML injection Inject 0-12 Units into the skin 3 (three) times daily before meals. Per sliding scale: If blood sugar is less than 70, call NP/PA. If blood sugar is 70 to 200, give 0 units. If blood sugar is 201 to 250, give 2 units. If blood sugar is 251 to 300, give 4 units. If blood sugar is 301 to 350, give 6 units. If blood sugar is 351 to 400, give 8 units. If blood sugar is 401 to 450, give 10 units. If blood sugar is 451 to 600, give 12 units. Recheck in 2 hours.   Yes [provider]  ipratropium-albuterol (DUONEB) 0.5-2.5 (3) MG/3ML SOLN Take 3 mLs by nebulization 2 (two) times daily. 09/19/22  Yes [provider]  Lactobacillus (PROBIOTIC ACIDOPHILUS) CAPS Take 1 capsule by mouth at bedtime.   Yes [provider]  loratadine (CLARITIN) 10 MG tablet Take 10 mg by mouth at bedtime.   Yes [provider]  losartan (COZAAR) 50 MG tablet Take 50 mg by mouth daily. HOLD for SBP < 110.   Yes [provider]  metFORMIN (GLUCOPHAGE) 500 MG tablet Take 0.5 tablets (250 mg total) by mouth 2 (two) times daily with a meal. Patient taking differently: Take 500 mg by mouth 2 (two) times daily with a meal. 10/26/22 11/25/22 Yes  Tacy Learn, PA-C  Multiple Vitamins-Minerals (PRESERVISION AREDS) CAPS Take 1 capsule by mouth daily.   Yes [provider]  OMEPRAZOLE PO Take 20 mg by mouth daily. Omeprazole 20 mg DR disintegrating tablet.   Yes [provider]  ondansetron (ZOFRAN) 4 MG tablet Take 4 mg by mouth every 8 (eight) hours as needed for nausea or vomiting.   Yes [provider]  Polyethyl Glycol-Propyl Glycol (LUBRICANT EYE DROPS)  0.4-0.3 % SOLN Place 2 drops into both eyes daily.   Yes [provider]  polyethylene glycol powder (GLYCOLAX/MIRALAX) 17 GM/SCOOP powder Take 17 g by mouth at bedtime.   Yes [provider]  polyvinyl alcohol (LIQUIFILM TEARS) 1.4 % ophthalmic solution Place 1 drop into both eyes in the morning, at noon, in the evening, and at bedtime. Following injection treatment   Yes [provider]  guaiFENesin (ROBITUSSIN) 100 MG/5ML liquid Take 10 mLs by mouth every 6 (six) hours as needed for cough or to loosen phlegm. Patient not taking: Reported on 11/04/2022 10/01/22   Barb Merino, MD  LEVEMIR FLEXPEN 100 UNIT/ML FlexPen Inject 5 Units into the skin daily. 11/02/22   [provider]     Vital Signs: BP (!) 149/60 (BP Location: Left Wrist)   Pulse 88   Temp 97.8 F (36.6 C)   Resp 14   Ht 5\' 4"  (1.626 m)   Wt 190 lb 11.2 oz (86.5 kg)   SpO2 99%   BMI 32.73 kg/m   Physical Exam Vitals reviewed.  Constitutional:      General: She is not in acute distress.    Appearance: Normal appearance. She is not ill-appearing.  HENT:     Ears:     Comments: Pt is HOH Pulmonary:     Effort: Pulmonary effort is normal. No respiratory distress.  Skin:    General: Skin is warm and dry.     Comments: Medial drain site unremarkable with sutures/statlock in place. Trace amount of reddish colored OP in JP. Dressing C/D/I.  Lateral drain ite unremarkable with sutures/statlock in place. Trace amount of reddish OP in JP. Dressing C/D/I.    Neurological:     Mental Status: She is alert.  Psychiatric:        Mood and Affect: Mood normal.        Behavior: Behavior normal.        Thought Content: Thought content normal.        Judgment: Judgment normal.     Imaging: DG ESOPHAGUS W SINGLE CM (SOL OR THIN BA)  Result Date: 11/13/2022 CLINICAL DATA:  Provided history: Vomiting. Swallowing air. Additional history provided: History of esophageal dysphagia/dysfunction.  EXAM: ESOPHAGUS/BARIUM SWALLOW/TABLET STUDY TECHNIQUE: A single contrast examination was performed using thin liquid barium. The exam was performed by Narda Rutherford, NP, and was supervised and interpreted by Dr. Kellie Simmering. FLUOROSCOPY: Fluoroscopy time: 1 minute, 12 seconds (17.8 mGy). COMPARISON:  Modified barium swallow 07/20/2022. Upper GI series 06/20/2017. Esophagram 03/27/2008. FINDINGS: Limited study due to the patient's inability to stand and limited ability to reposition on the fluoroscopy table. Additionally, a 13 mm barium tablet was not administered (nursing staff reports the patient is unable to swallow pills unless crushed). Mildly patulous esophagus. Focal narrowing of the distal esophagus compatible with stricture, similar to the prior upper GI series of 06/20/2017. It is estimated at the esophagus is narrowed to 5-7 mm at this site. Prominent intermittent esophageal dysmotility with tertiary contractions. Redemonstrated large hiatal hernia. No  gastroesophageal reflux was observed. IMPRESSION: 1. Limited examination due to the patient's inability to stand and limited ability to reposition on the fluoroscopy table. Additionally, a 13 mm barium tablet was not administered (nursing staff reports the patient is unable to swallow pills unless crushed). 2. Focal narrowing of the distal esophagus compatible with stricture, similar to the prior upper GI series 06/20/2017. It is estimated that the esophagus is narrowed to 5-7 mm at this site. 3. Mildly patulous esophagus elsewhere. 4. Prominent esophageal dysmotility with tertiary contractions. 5. Redemonstrated large hiatal hernia. Electronically Signed   By: Kellie Simmering D.O.   On: 11/13/2022 12:15   DG CHEST PORT 1 VIEW  Result Date: 11/13/2022 CLINICAL DATA:  Acute respiratory failure with hypoxia. EXAM: PORTABLE CHEST 1 VIEW COMPARISON:  11/11/2022. FINDINGS: Low lung volumes accentuate the pulmonary vasculature and cardiomediastinal silhouette.  Unchanged mild interstitial pulmonary edema and small bilateral pleural effusions. Unchanged retrocardiac opacity, likely atelectasis. Stable cardiac and mediastinal contours. No pneumothorax. IMPRESSION: Unchanged mild interstitial pulmonary edema and small bilateral pleural effusions. Electronically Signed   By: Emmit Alexanders M.D.   On: 11/13/2022 10:55    Labs:  CBC: Recent Labs    11/07/22 0333 11/08/22 0350 11/13/22 0351 11/15/22 0355  WBC 13.9* 13.0* 13.6* 10.7*  HGB 10.2* 9.5* 8.7* 8.8*  HCT 31.6* 28.7* 28.5* 27.7*  PLT 352 365 344 343     COAGS: No results for input(s): "INR", "APTT" in the last 8760 hours.  BMP: Recent Labs    11/12/22 0354 11/13/22 0351 11/14/22 0313 11/15/22 0355  NA 127* 129* 128* 128*  K 4.7 5.1 4.4 4.4  CL 89* 87* 86* 86*  CO2 29 31 32 34*  GLUCOSE 133* 119* 167* 158*  BUN 25* 25* 23 23  CALCIUM 8.9 9.3 9.2 9.2  CREATININE 0.85 0.76 0.77 0.80  GFRNONAA >60 >60 >60 >60     LIVER FUNCTION TESTS: Recent Labs    11/05/22 0443 11/06/22 0440 11/07/22 0333 11/08/22 0350 11/09/22 0039 11/11/22 0617 11/12/22 0354 11/14/22 0313 11/15/22 0355  BILITOT 0.8 0.6 0.5 0.3  --   --   --   --   --   AST 13* 14* 14* 14*  --   --   --   --   --   ALT 11 12 11 12   --   --   --   --   --   ALKPHOS 65 65 62 63  --   --   --   --   --   PROT 5.8* 5.8* 5.7* 5.5*  --   --   --   --   --   ALBUMIN 1.7* 1.7* 1.7* 1.9*   < > 1.9* 2.0* 2.3* 2.2*   < > = values in this interval not displayed.     Assessment and Plan: 89 yo female 2 days s/p right shoulder fluid collection drain placement x 2 11/04/22.  WBC 10.7 Afebrile, VSS She has been stable for several days with improvement in drain output to minimal per RN over the weekend.  Spoke with Dr. Loleta Books.  Plan for drain removal today.   Both right shoulder drains removed at bedside without complication.  Patient tolerated well.  Sites dressed. May remove and replace dressings as needed to keep  clean and dry until healed.   No needs in IR at this time.   Electronically Signed: Docia Barrier, PA 11/15/2022, 11:13 AM   I spent a total of 15  Minutes at the the patient's bedside AND on the patient's hospital floor or unit, greater than 50% of which was counseling/coordinating care for right shoulder periprosthetic fluid collection.

## 2022-11-15 NOTE — Discharge Summary (Signed)
Physician Discharge Summary   Patient: Heidi Dominguez MRN: [id number] DOB: [birth year]  Admit date:     11/03/2022  Discharge date: 11/15/22  Discharge Physician: Edwin Dada   PCP: Lajean Manes, MD     Recommendations at discharge:  Follow speech therapy instructions below Please perform twice daily oral care Obtain BMP in 4 days and fax to Dr. Augustin Coupe at [facility] follow up with Dr. Augustin Coupe at [facility] in 2 weeks Arrange follow up with Dr. Vicenta Dunning in 1-2 weeks Arrange follow up with Dr. Mignon Pine for Infectious Disease in 2 weeks Dr. Candiss Norse:  Please follow up aspirate culture at 14 days  Continue cefadroxil 1000 mg BID x 6 weeks, end of treatment 12/15/22, then transition to cefadroxil 500 mg po BID indefinitely for palliative suppression given hardware in place     Discharge Diagnoses: Principal Problem:   Right shoulder prosthetic joint infection Active Problems:   Hyponatremia   Essential hypertension   Diabetes mellitus type 2, controlled   Presbycusis of both ears   CKD (chronic kidney disease) stage 3, GFR 30-59 ml/min   COPD (chronic obstructive pulmonary disease)   Chronic respiratory failure with hypoxia   Acute on chronic diastolic CHF (congestive heart failure)   Nausea & vomiting   Delirium   Normocytic anemia       Hospital Course: Heidi Dominguez is a 89 y.o. F with dCHF, DM, HTN, CKD IIIa, hx PVD s/p tibial stent, and hx great toe osteo, hx bilateral shoulder replacement, previous right septic shoulder requiring antibiotic beads and recent RSV infection who presented with right shoulder pain.      3/22: In ER, CT showed large shoulder fluid collection; Admitted on abx, Ortho consulted 3/23: IR consulted, underwent US guided drain placement in right shoulder abscesses 3/24: Culture growing E coli, ID consulted 3/26: Nephrology consulted for hyponatremia 3/31: GI consulted for persistent  N/V      * Right shoulder prosthetic joint infection Possible septic arthritis of right shoulder H/o bilateral shoulder replacement and prior infection right prosthetic shoulder.   Ortho and ID were consulted.  She is not an operative candidate for removal of hardware or debridement.  IR were consulted and aspirated her fluid collection which grew E coli.  ID recommended 6 weeks cefadroxil, outpatient follow up and likely lifelong suppressive therapy after that.     Hyponatremia Evaluated by Nephrology and treated with tolvaptan and furosemide.  Nephrology recommend furosemide at discharge, repeat BMP in 4 days and follow up in the office in 2 weeks.       Acute on chronic diastolic CHF (congestive heart failure) Discharged on daily Lasix.    Dysphagia, nausea and vomiting Evaluated by SLP and GI.    Speech therapy recommend normal consistency diet with thin liquids.  They recommend meds in puree and twice daily oral care.  GI note that barium swallow was not possible to perform due to patient level factors, EGD was not possible due to risks of sedation and risk of dilation.  Likely hiatal hernia (also not modifiable) contributing to dysphagia as well.   Delirium At baseline patient is conversational and appropriate.  At times she was disoriented in the hospital.  Resolved by the time of discharge   Chronic respiratory failure Acute respiratory failure ruled out.  On O2 at baseline.            The [facility] Controlled Substances Registry was reviewed for this patient prior  to discharge.  Consultants:  GI Dr. Donnamarie Rossetti Orthopedics Dr. Veverly Fells Infectious disease, Dr. Gale Journey Nephrology, Dr. Posey Pronto and Dr. Augustin Coupe   Procedures performed: Niel Hummer placement  Disposition: Skilled nursing facility Diet recommendation: Regular   DISCHARGE MEDICATION: Allergies as of 11/15/2022       Reactions   Augmentin [amoxicillin-pot Clavulanate] Diarrhea   Bactrim  [sulfamethoxazole-trimethoprim] Diarrhea, Itching   Calan [verapamil] Diarrhea   Catapres [clonidine Hcl] Other (See Comments)   Unknown reaction Not documented on MAR   Codeine Other (See Comments)   "Makes me out of my head, loopy"   Hydrocodone Other (See Comments)   "makes me go out of my head, loopy"   Other Other (See Comments)   Any type of narcotics Feels "loopy"   Sulfa Antibiotics Itching   Zestril [lisinopril] Cough   Azulfidine [sulfasalazine] Itching        Medication List     STOP taking these medications    guaiFENesin 100 MG/5ML liquid Commonly known as: ROBITUSSIN   losartan 50 MG tablet Commonly known as: COZAAR       TAKE these medications    acetaminophen 325 MG tablet Commonly known as: TYLENOL Take 650 mg by mouth 3 (three) times daily.   albuterol 108 (90 Base) MCG/ACT inhaler Commonly known as: VENTOLIN HFA Inhale 2 puffs into the lungs every 6 (six) hours as needed for wheezing or shortness of breath.   amLODipine 10 MG tablet Commonly known as: NORVASC Take 10 mg by mouth daily. HOLD if SBP < 110.   aspirin EC 81 MG tablet Take 162 mg by mouth at bedtime.   budesonide 0.5 MG/2ML nebulizer solution Commonly known as: Pulmicort Take 2 mLs (0.5 mg total) by nebulization in the morning and at bedtime.   cefadroxil 500 MG capsule Commonly known as: DURICEF Take 2 capsules (1,000 mg total) by mouth 2 (two) times daily.   Cinnamon 500 MG capsule Take 500 mg by mouth daily.   dextromethorphan-guaiFENesin 30-600 MG 12hr tablet Commonly known as: MUCINEX DM Take 1 tablet by mouth 2 (two) times daily.   furosemide 20 MG tablet Commonly known as: LASIX Take 1 tablet (20 mg total) by mouth daily for 10 days.   GenTeal Severe 0.3 % Gel ophthalmic ointment Generic drug: hypromellose Place 1 Application into both eyes 2 (two) times daily.   HumaLOG 100 UNIT/ML injection Generic drug: insulin lispro Inject 0-12 Units into the skin 3  (three) times daily before meals. Per sliding scale: If blood sugar is less than 70, call NP/PA. If blood sugar is 70 to 200, give 0 units. If blood sugar is 201 to 250, give 2 units. If blood sugar is 251 to 300, give 4 units. If blood sugar is 301 to 350, give 6 units. If blood sugar is 351 to 400, give 8 units. If blood sugar is 401 to 450, give 10 units. If blood sugar is 451 to 600, give 12 units. Recheck in 2 hours.   ipratropium-albuterol 0.5-2.5 (3) MG/3ML Soln Commonly known as: DUONEB Take 3 mLs by nebulization 2 (two) times daily.   Levemir FlexPen 100 UNIT/ML FlexPen Generic drug: insulin detemir Inject 5 Units into the skin daily.   loratadine 10 MG tablet Commonly known as: CLARITIN Take 10 mg by mouth at bedtime.   Lubricant Eye Drops 0.4-0.3 % Soln Generic drug: Polyethyl Glycol-Propyl Glycol Place 2 drops into both eyes daily.   metFORMIN 500 MG tablet Commonly known as: GLUCOPHAGE Take 0.5 tablets (250  mg total) by mouth 2 (two) times daily with a meal. What changed: how much to take   OMEPRAZOLE PO Take 20 mg by mouth daily. Omeprazole 20 mg DR disintegrating tablet.   ondansetron 4 MG tablet Commonly known as: ZOFRAN Take 4 mg by mouth every 8 (eight) hours as needed for nausea or vomiting.   polyethylene glycol powder 17 GM/SCOOP powder Commonly known as: GLYCOLAX/MIRALAX Take 17 g by mouth at bedtime.   polyvinyl alcohol 1.4 % ophthalmic solution Commonly known as: LIQUIFILM TEARS Place 1 drop into both eyes in the morning, at noon, in the evening, and at bedtime. Following injection treatment   PreserVision AREDS Caps Take 1 capsule by mouth daily.   Probiotic Acidophilus Caps Take 1 capsule by mouth at bedtime.   Vitamin D3 25 MCG (1000 UT) capsule Generic drug: Cholecalciferol Take 1,000 Units by mouth every Monday.   Voltaren Arthritis Pain 1 % Gel Generic drug: diclofenac Sodium Apply 2 g topically 3 (three) times daily. To both  shoulders.               Discharge Care Instructions  (From admission, onward)           Start     Ordered   11/15/22 0000  Discharge wound care:       Comments: Cover shoulder wounds with simple bandage (ie bandaid or similar), change every 2 days or if soiled until no longer visible   11/15/22 [id number]            Follow-up Information     [name], MD Follow up.   Specialty: Orthopedic Surgery Contact information: [address] [place] [address] [id number]         [name], MD .   Specialty: Infectious Diseases Contact information: [address] [place] [address] [phone]                 Discharge Instructions     Discharge wound care:   Complete by: As directed    Cover shoulder wounds with simple bandage (ie bandaid or similar), change every 2 days or if soiled until no longer visible       Discharge Exam: Filed Weights   11/03/22 1749  Weight: 86.5 kg    General: Pt is sitting up eating breakfast, responsive to questionsno  acute distress Cardiovascular: RRR, nl S1-S2, loud sys murmur noted.   Mild diffuse nonpitting edema.   Respiratory: Normal respiratory rate and rhythm.  CTAB without rales or wheezes. Abdominal: Abdomen soft and non-tender.  No distension or HSM.   Neuro/Psych: Strength symmetric in upper and lower extremities.  Judgment and insight appear at baseline, severe generalized weakness, shoulder pain limits movement.   Condition at discharge: stable  The results of significant diagnostics from this hospitalization (including imaging, microbiology, ancillary and laboratory) are listed below for reference.   Imaging Studies: DG ESOPHAGUS W SINGLE CM (SOL OR THIN BA)  Result Date: 11/13/2022 CLINICAL DATA:  Provided history: Vomiting. Swallowing air. Additional history provided: History of esophageal dysphagia/dysfunction. EXAM: ESOPHAGUS/BARIUM SWALLOW/TABLET STUDY  TECHNIQUE: A single contrast examination was performed using thin liquid barium. The exam was performed by Narda Rutherford, NP, and was supervised and interpreted by Dr. Kellie Simmering. FLUOROSCOPY: Fluoroscopy time: 1 minute, 12 seconds (17.8 mGy). COMPARISON:  Modified barium swallow 07/20/2022. Upper GI series 06/20/2017. Esophagram 03/27/2008. FINDINGS: Limited study due to the patient's inability to stand and limited ability to reposition on the  fluoroscopy table. Additionally, a 13 mm barium tablet was not administered (nursing staff reports the patient is unable to swallow pills unless crushed). Mildly patulous esophagus. Focal narrowing of the distal esophagus compatible with stricture, similar to the prior upper GI series of 06/20/2017. It is estimated at the esophagus is narrowed to 5-7 mm at this site. Prominent intermittent esophageal dysmotility with tertiary contractions. Redemonstrated large hiatal hernia. No gastroesophageal reflux was observed. IMPRESSION: 1. Limited examination due to the patient's inability to stand and limited ability to reposition on the fluoroscopy table. Additionally, a 13 mm barium tablet was not administered (nursing staff reports the patient is unable to swallow pills unless crushed). 2. Focal narrowing of the distal esophagus compatible with stricture, similar to the prior upper GI series 06/20/2017. It is estimated that the esophagus is narrowed to 5-7 mm at this site. 3. Mildly patulous esophagus elsewhere. 4. Prominent esophageal dysmotility with tertiary contractions. 5. Redemonstrated large hiatal hernia. Electronically Signed   By: Kellie Simmering D.O.   On: 11/13/2022 12:15   DG CHEST PORT 1 VIEW  Result Date: 11/13/2022 CLINICAL DATA:  Acute respiratory failure with hypoxia. EXAM: PORTABLE CHEST 1 VIEW COMPARISON:  11/11/2022. FINDINGS: Low lung volumes accentuate the pulmonary vasculature and cardiomediastinal silhouette. Unchanged mild interstitial pulmonary edema and  small bilateral pleural effusions. Unchanged retrocardiac opacity, likely atelectasis. Stable cardiac and mediastinal contours. No pneumothorax. IMPRESSION: Unchanged mild interstitial pulmonary edema and small bilateral pleural effusions. Electronically Signed   By: Emmit Alexanders M.D.   On: 11/13/2022 10:55   DG Chest 1 View  Result Date: 11/11/2022 CLINICAL DATA:  Shortness of breath EXAM: PORTABLE CHEST 1 VIEW COMPARISON:  11/03/22 FINDINGS: Cardiac shadow is enlarged but stable. Aortic calcifications are again seen. Lungs are well aerated bilaterally. Slight increase in central vascular congestion is noted with edema. Large hiatal hernia is noted. IMPRESSION: Slight increase in central vascular congestion with mild edema. Large hiatal hernia. Electronically Signed   By: Inez Catalina M.D.   On: 11/11/2022 01:16   ECHOCARDIOGRAM COMPLETE  Result Date: 11/09/2022    ECHOCARDIOGRAM REPORT   Patient Name:   BAILLIE NASON Date of Exam: 11/09/2022 Medical Rec #:  [id number]       Height:       64.0 in Accession #:    [id number]      Weight:       190.7 lb Date of Birth:  [birth year]       BSA:          1.917 m Patient Age:    31 years        BP:           135/74 mmHg Patient Gender: F               HR:           90 bpm. Exam Location:  Inpatient Procedure: 2D Echo, 3D Echo, Cardiac Doppler, Color Doppler and Intracardiac            Opacification Agent Indications:     CHF/Aortic Stenosis  History:         Patient has prior history of Echocardiogram examinations, most                  recent 04/09/2015. CHF, Signs/Symptoms:Murmur and Dyspnea; Risk                  Factors:Diabetes and Hypertension. CKD.  Sonographer:     Eartha Inch Referring  Phys:  [id number] Patrecia Pour Diagnosing Phys: Carlyle Dolly MD  Sonographer Comments: Image acquisition challenging due to patient body habitus and Image acquisition challenging due to respiratory motion. IMPRESSIONS  1. Left ventricular ejection fraction, by estimation,  is 65 to 70%. The left ventricle has normal function. The left ventricle has no regional wall motion abnormalities. There is mild left ventricular hypertrophy. Left ventricular diastolic parameters are consistent with Grade I diastolic dysfunction (impaired relaxation). Elevated left atrial pressure.  2. Right ventricular systolic function is normal. The right ventricular size is normal. There is mildly elevated pulmonary artery systolic pressure.  3. Left atrial size was severely dilated.  4. A small pericardial effusion is present. The pericardial effusion is circumferential. There is no evidence of cardiac tamponade.  5. The mitral valve is abnormal. Trivial mitral valve regurgitation. Moderate mitral stenosis. The mean mitral valve gradient is 6.0 mmHg.HR 87 bpm  6. The tricuspid valve is abnormal.  7. The aortic valve is tricuspid. There is moderate calcification of the aortic valve. There is moderate thickening of the aortic valve. Aortic valve regurgitation is not visualized. Severe aortic valve stenosis. Aortic valve mean gradient measures 64.0  mmHg. Aortic valve peak gradient measures 91.6 mmHg. Aortic valve area, by VTI measures 0.94 cm.  8. The pulmonic valve was abnormal.  9. The inferior vena cava is dilated in size with >50% respiratory variability, suggesting right atrial pressure of 8 mmHg. FINDINGS  Left Ventricle: Left ventricular ejection fraction, by estimation, is 65 to 70%. The left ventricle has normal function. The left ventricle has no regional wall motion abnormalities. Definity contrast agent was given IV to delineate the left ventricular  endocardial borders. The left ventricular internal cavity size was normal in size. There is mild left ventricular hypertrophy. Left ventricular diastolic parameters are consistent with Grade I diastolic dysfunction (impaired relaxation). Elevated left atrial pressure. Right Ventricle: The right ventricular size is normal. Right vetricular wall  thickness was not well visualized. Right ventricular systolic function is normal. There is mildly elevated pulmonary artery systolic pressure. The tricuspid regurgitant velocity  is 3.00 m/s, and with an assumed right atrial pressure of 8 mmHg, the estimated right ventricular systolic pressure is 123XX123 mmHg. Left Atrium: Left atrial size was severely dilated. Right Atrium: Right atrial size was normal in size. Pericardium: A small pericardial effusion is present. The pericardial effusion is circumferential. There is no evidence of cardiac tamponade. Mitral Valve: The mitral valve is abnormal. Trivial mitral valve regurgitation. Moderate mitral valve stenosis. MV peak gradient, 16.9 mmHg. The mean mitral valve gradient is 6.0 mmHg. Tricuspid Valve: The tricuspid valve is abnormal. Tricuspid valve regurgitation is mild . No evidence of tricuspid stenosis. Aortic Valve: The aortic valve is tricuspid. There is moderate calcification of the aortic valve. There is moderate thickening of the aortic valve. There is moderate aortic valve annular calcification. Aortic valve regurgitation is not visualized. Severe  aortic stenosis is present. Aortic valve mean gradient measures 64.0 mmHg. Aortic valve peak gradient measures 91.6 mmHg. Aortic valve area, by VTI measures 0.94 cm. Pulmonic Valve: The pulmonic valve was abnormal. Pulmonic valve regurgitation is mild. No evidence of pulmonic stenosis. Aorta: The aortic root and ascending aorta are structurally normal, with no evidence of dilitation. Venous: The inferior vena cava is dilated in size with greater than 50% respiratory variability, suggesting right atrial pressure of 8 mmHg. IAS/Shunts: No atrial level shunt detected by color flow Doppler.  LEFT VENTRICLE PLAX 2D LVIDd:  3.60 cm     Diastology LVIDs:         2.60 cm     LV e' medial:    4.57 cm/s LV PW:         1.10 cm     LV E/e' medial:  27.1 LV IVS:        1.10 cm     LV e' lateral:   4.46 cm/s LVOT diam:      2.00 cm     LV E/e' lateral: 27.8 LV SV:         92 LV SV Index:   48 LVOT Area:     3.14 cm  LV Volumes (MOD) LV vol d, MOD A2C: 72.9 ml LV vol d, MOD A4C: 82.1 ml LV vol s, MOD A2C: 18.7 ml LV vol s, MOD A4C: 17.5 ml LV SV MOD A2C:     54.2 ml LV SV MOD A4C:     82.1 ml LV SV MOD BP:      59.9 ml RIGHT VENTRICLE             IVC RV S prime:     13.90 cm/s  IVC diam: 2.30 cm TAPSE (M-mode): 1.5 cm LEFT ATRIUM              Index        RIGHT ATRIUM           Index LA diam:        4.70 cm  2.45 cm/m   RA Area:     20.90 cm LA Vol (A2C):   135.0 ml 70.42 ml/m  RA Volume:   64.70 ml  33.75 ml/m LA Vol (A4C):   103.0 ml 53.72 ml/m LA Biplane Vol: 121.0 ml 63.11 ml/m  AORTIC VALVE                     PULMONIC VALVE AV Area (Vmax):    0.91 cm      PR End Diast Vel: 5.76 msec AV Area (Vmean):   0.92 cm AV Area (VTI):     0.94 cm AV Vmax:           478.50 cm/s AV Vmean:          363.500 cm/s AV VTI:            0.987 m AV Peak Grad:      91.6 mmHg AV Mean Grad:      64.0 mmHg LVOT Vmax:         139.00 cm/s LVOT Vmean:        107.000 cm/s LVOT VTI:          0.294 m LVOT/AV VTI ratio: 0.30  AORTA Ao Root diam: 2.90 cm Ao Asc diam:  3.40 cm MITRAL VALVE                TRICUSPID VALVE MV Area VTI:  1.87 cm      TR Peak grad:   36.0 mmHg MV Peak grad: 16.9 mmHg     TR Mean grad:   27.0 mmHg MV Mean grad: 6.0 mmHg      TR Vmax:        300.00 cm/s MV Vmax:      2.05 m/s      TR Vmean:       255.0 cm/s MV Vmean:     114.5 cm/s MR Peak grad: 65.9 mmHg     SHUNTS MR Vmax:  406.00 cm/s   Systemic VTI:  0.29 m MV E velocity: 124.00 cm/s  Systemic Diam: 2.00 cm Carlyle Dolly MD Electronically signed by Carlyle Dolly MD Signature Date/Time: 11/09/2022/3:03:42 PM    Final (Updated)    DG Abd 1 View  Result Date: 11/05/2022 CLINICAL DATA:  Constipation for several days EXAM: ABDOMEN - 1 VIEW COMPARISON:  09/16/2018 FINDINGS: Scattered large and small bowel gas is noted. Retained fecal material is noted throughout the  colon consistent with a moderate degree of constipation. No obstructive changes are seen. No free air is noted. Changes of prior vertebral augmentation are seen and stable. Degenerative changes of lumbar spine are noted. IMPRESSION: Changes consistent with moderate colonic constipation. Electronically Signed   By: Inez Catalina M.D.   On: 11/05/2022 20:27   IR US Guide Bx Asp/Drain  Result Date: 11/04/2022 INDICATION: 89 year old female with right shoulder periprosthetic fluid collections that are suggestive for abscesses. EXAM: 1. Ultrasound-guided placement of drainage catheter in lateral right shoulder abscess collection 2. Ultrasound-guided placement of drainage catheter in the anterior right shoulder abscess collection MEDICATIONS: 1% lidocaine for local anesthetic ANESTHESIA/SEDATION: None COMPLICATIONS: None immediate. PROCEDURE: Informed written consent was obtained from the patient's daughter after a thorough discussion of the procedural risks, benefits and alternatives. All questions were addressed. Maximal Sterile Barrier Technique was utilized including caps, mask, sterile gowns, sterile gloves, sterile drape, hand hygiene and skin antiseptic. A timeout was performed prior to the initiation of the procedure. The right shoulder was prepped and draped in sterile fashion. Ultrasound demonstrated a large fluid collection along the lateral aspect of the right shoulder. The skin was anesthetized with 1% lidocaine. Small incision was made. Using ultrasound guidance, a Yueh catheter was directed into the fluid collection and pink purulent fluid was aspirated. Superstiff Amplatz wire was placed and the tract was dilated to accommodate a 10 [place] multipurpose drain. Approximately 130 mL of pink purulent fluid was removed. This collection was decompressed. Ultrasound of right shoulder demonstrated persistent fluid collections in the anterior right shoulder region. The largest right anterior shoulder fluid  collection was targeted. The skin was anesthetized with 1% lidocaine. Small incision was made. Using ultrasound guidance, a Yueh catheter was directed into this anterior fluid collection and yellow purulent fluid was aspirated. Superstiff Amplatz wire was placed and an 8 [employer] drain was advanced over the wire. Approximately 40 mL of fluid was removed from this collection. Ultrasound was also used to evaluate the remainder of the right shoulder. Both drains were sutured to skin with Prolene suture. Both drains were attached to suction bulbs. Fluid sample from each drain was sent for culture. FINDINGS: There was a very large fluid collection in the lateral aspect of the right shoulder and 130 mL of pink purulent fluid was removed from this collection. This collection was decompressed at the end of the procedure. There were small fluid collections along the anterior aspect of the right shoulder including a dominant collection. An 8 French drain was placed in the dominant anterior right fluid collection and this appeared to decompress the other right anterior fluid collections. 40 mL of yellow purulent fluid was removed from the right anterior shoulder fluid collection. IMPRESSION: 1. Ultrasound-guided placement of a drainage catheter in the right lateral shoulder abscess collection. 2. Ultrasound-guided placement of a drainage catheter in the right anterior shoulder abscess. Electronically Signed   By: Markus Daft M.D.   On: 11/04/2022 15:25   IR US Guide Bx Asp/Drain  Result Date:  11/04/2022 INDICATION: 89 year old female with right shoulder periprosthetic fluid collections that are suggestive for abscesses. EXAM: 1. Ultrasound-guided placement of drainage catheter in lateral right shoulder abscess collection 2. Ultrasound-guided placement of drainage catheter in the anterior right shoulder abscess collection MEDICATIONS: 1% lidocaine for local anesthetic ANESTHESIA/SEDATION: None COMPLICATIONS:  None immediate. PROCEDURE: Informed written consent was obtained from the patient's daughter after a thorough discussion of the procedural risks, benefits and alternatives. All questions were addressed. Maximal Sterile Barrier Technique was utilized including caps, mask, sterile gowns, sterile gloves, sterile drape, hand hygiene and skin antiseptic. A timeout was performed prior to the initiation of the procedure. The right shoulder was prepped and draped in sterile fashion. Ultrasound demonstrated a large fluid collection along the lateral aspect of the right shoulder. The skin was anesthetized with 1% lidocaine. Small incision was made. Using ultrasound guidance, a Yueh catheter was directed into the fluid collection and pink purulent fluid was aspirated. Superstiff Amplatz wire was placed and the tract was dilated to accommodate a 10 [place] multipurpose drain. Approximately 130 mL of pink purulent fluid was removed. This collection was decompressed. Ultrasound of right shoulder demonstrated persistent fluid collections in the anterior right shoulder region. The largest right anterior shoulder fluid collection was targeted. The skin was anesthetized with 1% lidocaine. Small incision was made. Using ultrasound guidance, a Yueh catheter was directed into this anterior fluid collection and yellow purulent fluid was aspirated. Superstiff Amplatz wire was placed and an 8 [employer] drain was advanced over the wire. Approximately 40 mL of fluid was removed from this collection. Ultrasound was also used to evaluate the remainder of the right shoulder. Both drains were sutured to skin with Prolene suture. Both drains were attached to suction bulbs. Fluid sample from each drain was sent for culture. FINDINGS: There was a very large fluid collection in the lateral aspect of the right shoulder and 130 mL of pink purulent fluid was removed from this collection. This collection was decompressed at the end of the  procedure. There were small fluid collections along the anterior aspect of the right shoulder including a dominant collection. An 8 French drain was placed in the dominant anterior right fluid collection and this appeared to decompress the other right anterior fluid collections. 40 mL of yellow purulent fluid was removed from the right anterior shoulder fluid collection. IMPRESSION: 1. Ultrasound-guided placement of a drainage catheter in the right lateral shoulder abscess collection. 2. Ultrasound-guided placement of a drainage catheter in the right anterior shoulder abscess. Electronically Signed   By: Markus Daft M.D.   On: 11/04/2022 15:25   VAS [place] LOWER EXTREMITY VENOUS (DVT) (7a-7p)  Result Date: 11/04/2022  Lower Venous DVT Study Patient Name:  MANERVIA MUSCO  Date of Exam:   11/03/2022 Medical Rec #: [id number]        Accession #:    [id number] Date of Birth: 1920-08-24        Patient Gender: F Patient Age:   22 years Exam Location:  [facility] Procedure:      VAS [place] LOWER EXTREMITY VENOUS (DVT) Referring Phys: ADAM CURATOLO --------------------------------------------------------------------------------  Indications: Pain, Edema, and Chronic swelling.  Risk Factors: Surgery Lower ext bypass graft, left leg stenting. Comparison Study: 03/16/21 - Negative LE venous Performing Technologist: Velva Harman Sturdivant RDMS, RVT  Examination Guidelines: A complete evaluation includes B-mode imaging, spectral Doppler, color Doppler, and power Doppler as needed of all accessible portions of each vessel. Bilateral testing is considered an integral part  of a complete examination. Limited examinations for reoccurring indications may be performed as noted. The reflux portion of the exam is performed with the patient in reverse Trendelenburg.  +---------+---------------+---------+-----------+----------+-------------------+ RIGHT    CompressibilityPhasicitySpontaneityPropertiesThrombus Aging       +---------+---------------+---------+-----------+----------+-------------------+ CFV      Full           Yes      Yes                                      +---------+---------------+---------+-----------+----------+-------------------+ SFJ      Full                                                             +---------+---------------+---------+-----------+----------+-------------------+ FV Prox  Full                                                             +---------+---------------+---------+-----------+----------+-------------------+ FV Mid   Full                                                             +---------+---------------+---------+-----------+----------+-------------------+ FV DistalFull                                                             +---------+---------------+---------+-----------+----------+-------------------+ PFV      Full                                                             +---------+---------------+---------+-----------+----------+-------------------+ POP      Full           Yes      Yes                                      +---------+---------------+---------+-----------+----------+-------------------+ PTV      Full                                                             +---------+---------------+---------+-----------+----------+-------------------+ PERO  Not well visualized +---------+---------------+---------+-----------+----------+-------------------+   Right Technical Findings: Bilateral calf veins were not well visualized. Cannot exclude chronic DVT in the calf veins.  +---------+---------------+---------+-----------+----------+-------------------+ LEFT     CompressibilityPhasicitySpontaneityPropertiesThrombus Aging      +---------+---------------+---------+-----------+----------+-------------------+ CFV      Full           Yes      Yes                                       +---------+---------------+---------+-----------+----------+-------------------+ SFJ      Full                                                             +---------+---------------+---------+-----------+----------+-------------------+ FV Prox  Full                                                             +---------+---------------+---------+-----------+----------+-------------------+ FV Mid   Full                                                             +---------+---------------+---------+-----------+----------+-------------------+ FV DistalFull                                                             +---------+---------------+---------+-----------+----------+-------------------+ PFV      Full                                                             +---------+---------------+---------+-----------+----------+-------------------+ POP      Full           Yes      Yes                                      +---------+---------------+---------+-----------+----------+-------------------+ PTV      Full                                                             +---------+---------------+---------+-----------+----------+-------------------+ PERO  Not well visualized +---------+---------------+---------+-----------+----------+-------------------+     Summary: BILATERAL: - No evidence of acute deep vein thrombosis seen in the lower extremities, bilaterally. -No evidence of popliteal cyst, bilaterally. RIGHT: -   *See table(s) above for measurements and observations. Electronically signed by Orlie Pollen on 11/04/2022 at 10:55:07 AM.    Final    CT SHOULDER RIGHT W CONTRAST  Result Date: 11/03/2022 CLINICAL DATA:  Septic arthritis suspected, shoulder, xray done. Leukocytosis and elevated serum inflammatory markers. EXAM: CT OF THE UPPER RIGHT EXTREMITY WITH CONTRAST  TECHNIQUE: Multidetector CT imaging of the upper right extremity was performed according to the standard protocol following intravenous contrast administration. RADIATION DOSE REDUCTION: This exam was performed according to the departmental dose-optimization program which includes automated exposure control, adjustment of the mA and/or kV according to patient size and/or use of iterative reconstruction technique. CONTRAST:  80mL OMNIPAQUE IOHEXOL 350 MG/ML SOLN COMPARISON:  X-ray 11/03/2022, CT 05/27/2021 FINDINGS: Bones/Joint/Cartilage Long stem right humeral prosthesis. The inferior tip of the prosthesis is not included within the field of view. Chronic superomedial migration of the humeral head component with extensive osseous remodeling of the scapula and distal clavicle. Chronic osteolysis involving the proximal humeral diaphysis, not significantly progressed. No acute fracture. Os acromiale. Ligaments Suboptimally assessed by CT. Muscles and Tendons Findings of chronic rotator cuff tears with chronic muscle atrophy. Soft tissues Large peripherally enhancing periprosthetic fluid collection surrounding the proximal right humerus measuring approximately 14 x 5 cm in transaxial dimension. Loculated component at the anterior shoulder extends approximately 10 cm in craniocaudal dimension. Collection closely approximates the skin surface at the anterior aspect of the shoulder. Loculated component at the posterior aspect of the shoulder measures approximately 8 cm in craniocaudal dimension. Mildly prominent right axillary lymph nodes are likely reactive. Small layering right-sided pleural effusion. Multinodular goiter, similar to the previous study. In the setting of significant comorbidities or limited life expectancy, no follow-up recommended (ref: J Am Coll Radiol. 2015 Feb;12(2): 143-50). IMPRESSION: 1. Large peripherally-enhancing periprosthetic fluid collection surrounding the proximal right humerus measuring up  to 14 cm in maximal dimension. Findings are most concerning for infected fluid collection/abscess given the patient's history. Aspiration and joint fluid analysis is recommended. 2. Chronic superomedial migration of the humeral head component with extensive osseous remodeling of the scapula and distal clavicle. Chronic osteolysis involving the proximal humeral diaphysis, not significantly progressed. 3. Small layering right-sided pleural effusion. Electronically Signed   By: Davina Poke D.O.   On: 11/03/2022 15:09   DG Chest 2 View  Result Date: 11/03/2022 CLINICAL DATA:  Chest pain. EXAM: CHEST - 2 VIEW COMPARISON:  October 25, 2022. FINDINGS: Stable cardiomegaly. Large hiatal hernia is noted. Minimal right basilar subsegmental atelectasis is noted. Status post bilateral shoulder arthroplasties. IMPRESSION: Minimal right basilar subsegmental atelectasis. Large hiatal hernia. Electronically Signed   By: Marijo Conception M.D.   On: 11/03/2022 12:06   DG Shoulder Right  Result Date: 11/03/2022 CLINICAL DATA:  Pain EXAM: RIGHT SHOULDER - 3 VIEW COMPARISON:  None Available. FINDINGS: There is a right shoulder prosthesis. There appears to be grossly anatomic alignment. There is absence of the acromial humeral distance indicating the presence of rotator cuff defect. No acute fracture, dislocation or subluxation identified. IMPRESSION: Status post right shoulder arthroplasty. No acute osseous abnormalities are seen. Electronically Signed   By: Sammie Bench M.D.   On: 11/03/2022 12:03   DG Chest 2 View  Result Date: 10/25/2022 CLINICAL DATA:  Shortness of breath. EXAM: CHEST -  2 VIEW COMPARISON:  09/28/2022. FINDINGS: The heart is enlarged and the mediastinal contour is within normal limits. A large hiatal hernia is noted. There is atherosclerotic calcification of the aorta. Lung volumes are low with atelectasis at the lung bases. No effusion or pneumothorax. Shoulder arthroplasty changes are present  bilaterally. Degenerative changes are noted on the thoracic spine. Surgical clips are present in the right upper quadrant. IMPRESSION: 1. Low lung volumes with mild atelectasis at the lung bases. 2. Cardiomegaly. 3. Large hiatal hernia. Electronically Signed   By: Brett Fairy M.D.   On: 10/25/2022 22:54    Microbiology: Results for orders placed or performed during the hospital encounter of 11/03/22  Blood culture (routine x 2)     Status: None   Collection Time: 11/03/22 11:36 AM   Specimen: BLOOD  Result Value Ref Range Status   Specimen Description BLOOD LEFT ANTECUBITAL  Final   Special Requests   Final    BOTTLES DRAWN AEROBIC AND ANAEROBIC Blood Culture adequate volume   Culture   Final    NO GROWTH 5 DAYS Performed at [facility], [place] [address] [place], [place] [address]    Report Status 11/08/2022 FINAL  Final  MRSA Next Gen by PCR, Nasal     Status: None   Collection Time: 11/03/22  6:08 PM   Specimen: Nasal Mucosa; Nasal Swab  Result Value Ref Range Status   MRSA by PCR Next Gen NOT DETECTED NOT DETECTED Final    Comment: (NOTE) The GeneXpert MRSA Assay (FDA approved for NASAL specimens only), is one component of a comprehensive MRSA colonization surveillance program. It is not intended to diagnose MRSA infection nor to guide or monitor treatment for MRSA infections. Test performance is not FDA approved in patients less than 45 years old. Performed at [facility], [place] [address], [place], [place] [address]   Culture, blood (Routine X 2) w Reflex to ID Panel     Status: None   Collection Time: 11/04/22  9:47 AM   Specimen: BLOOD  Result Value Ref Range Status   Specimen Description BLOOD SITE NOT SPECIFIED  Final   Special Requests   Final    BOTTLES DRAWN AEROBIC AND ANAEROBIC Blood Culture results may not be optimal due to an excessive volume of blood received in culture bottles   Culture   Final    NO GROWTH 5 DAYS Performed at [facility], [place] [address], [place], [place] [address]    Report Status 11/09/2022 FINAL  Final  Aerobic/Anaerobic Culture w Gram Stain (surgical/deep wound)     Status: None (Preliminary result)   Collection Time: 11/04/22 12:34 PM   Specimen: Abscess  Result Value Ref Range Status   Specimen Description ABSCESS  Final   Special Requests DRAIN 1 RIGHT LATERAL SHOULDER  Final   Gram Stain   Final    ABUNDANT WBC PRESENT, PREDOMINANTLY PMN NO ORGANISMS SEEN    Culture   Final    FEW ESCHERICHIA COLI SUSCEPTIBILITIES PERFORMED ON PREVIOUS CULTURE WITHIN THE LAST 5 DAYS. CONTINUING TO HOLD PER DR VU HOLD 14 DAYS FOR P ACNES Performed at [facility], [place] [address] [place], [place] [address]    Report Status PENDING  Incomplete  Aerobic/Anaerobic Culture w Gram Stain (surgical/deep wound)     Status: None (Preliminary result)   Collection Time: 11/04/22 12:43 PM   Specimen: Abscess  Result Value Ref Range Status   Specimen Description  ABSCESS  Final   Special Requests DRAIN 2 RIGHT ANTERIOR SHOULDER  Final   Gram Stain   Final    ABUNDANT WBC PRESENT, PREDOMINANTLY PMN NO ORGANISMS SEEN    Culture   Final    FEW ESCHERICHIA COLI CONTINUING TO HOLD PER DR VU HOLD FOR 14 DAYS FOR P ACNES Performed at [facility], [address] [place], [place] [address]    Report Status PENDING  Incomplete   Organism ID, Bacteria ESCHERICHIA COLI  Final      Susceptibility   Escherichia coli - MIC*    AMPICILLIN >=32 RESISTANT Resistant     CEFEPIME <=0.12 SENSITIVE Sensitive     CEFTAZIDIME <=1 SENSITIVE Sensitive     CEFTRIAXONE <=0.25 SENSITIVE Sensitive     CIPROFLOXACIN 1 RESISTANT Resistant     GENTAMICIN <=1 SENSITIVE Sensitive     IMIPENEM <=0.25 SENSITIVE Sensitive     TRIMETH/SULFA >=320 RESISTANT Resistant     AMPICILLIN/SULBACTAM >=32 RESISTANT Resistant     PIP/TAZO <=4 SENSITIVE Sensitive     * FEW ESCHERICHIA COLI    Labs: CBC: Recent Labs  Lab 11/13/22 0351  11/15/22 0355  WBC 13.6* 10.7*  NEUTROABS  --  8.7*  HGB 8.7* 8.8*  HCT 28.5* 27.7*  MCV 87.7 86.6  PLT 344 [id number]   Basic Metabolic Panel: Recent Labs  Lab 11/10/22 0340 11/11/22 0617 11/12/22 0354 11/13/22 0351 11/14/22 0313 11/15/22 0355  NA 126* 126* 127* 129* 128* 128*  K 4.3 4.6 4.7 5.1 4.4 4.4  CL 88* 86* 89* 87* 86* 86*  CO2 28 29 29 31  32 34*  GLUCOSE 107* 175* 133* 119* 167* 158*  BUN 16 22 25* 25* 23 23  CREATININE 0.76 0.88 0.85 0.76 0.77 0.80  CALCIUM 8.5* 8.9 8.9 9.3 9.2 9.2  PHOS 3.2 4.3 3.7  --  2.8 2.8   Liver Function Tests: Recent Labs  Lab 11/10/22 0340 11/11/22 0617 11/12/22 0354 11/14/22 0313 11/15/22 0355  ALBUMIN 1.9* 1.9* 2.0* 2.3* 2.2*   CBG: Recent Labs  Lab 11/14/22 0727 11/14/22 1154 11/14/22 1621 11/14/22 2039 11/15/22 0822  GLUCAP 206* 217* 266* 210* 168*    Discharge time spent: approximately 35 minutes spent on discharge counseling, evaluation of patient on day of discharge, and coordination of discharge planning with nursing, social work, pharmacy and case management  Signed: Edwin Dada, MD [facility] 11/15/2022

## 2022-11-15 NOTE — Progress Notes (Signed)
Patient A&Ox 4 this shift. Spoke with patient's daughter via phone and at bedside, extensively updated on patient and plan of care. Attempted to wean patient down from oxygen this shift. Patient with increased nausea and multiple episodes of vomiting a thick, grey-colored mucus. Pt SOB with SATs at 82% on 2L [place]. MD made aware. PRN med given for nausea/vomiting, see MAR. Increased O2 to 4L, pt SATs at 97%. Respiratory called for neb treatment. MD at bedside to evaluate patient, see orders. Patient resting in bed. No complaints.

## 2022-11-16 ENCOUNTER — Inpatient Hospital Stay (HOSPITAL_COMMUNITY): Payer: Medicare PPO

## 2022-11-16 DIAGNOSIS — T8450XA Infection and inflammatory reaction due to unspecified internal joint prosthesis, initial encounter: Secondary | ICD-10-CM | POA: Diagnosis not present

## 2022-11-16 LAB — COMPREHENSIVE METABOLIC PANEL
ALT: 21 U/L (ref 0–44)
AST: 19 U/L (ref 15–41)
Albumin: 2.1 g/dL — ABNORMAL LOW (ref 3.5–5.0)
Alkaline Phosphatase: 62 U/L (ref 38–126)
Anion gap: 11 (ref 5–15)
BUN: 20 mg/dL (ref 8–23)
CO2: 35 mmol/L — ABNORMAL HIGH (ref 22–32)
Calcium: 9.4 mg/dL (ref 8.9–10.3)
Chloride: 86 mmol/L — ABNORMAL LOW (ref 98–111)
Creatinine, Ser: 0.68 mg/dL (ref 0.44–1.00)
GFR, Estimated: >60 mL/min (ref >60)
Glucose, Bld: 113 mg/dL — ABNORMAL HIGH (ref 70–99)
Potassium: 4.3 mmol/L (ref 3.5–5.1)
Sodium: 132 mmol/L — ABNORMAL LOW (ref 135–145)
Total Bilirubin: 0.5 mg/dL (ref 0.3–1.2)
Total Protein: 6.1 g/dL — ABNORMAL LOW (ref 6.5–8.1)

## 2022-11-16 LAB — CBC
HCT: 29.5 % — ABNORMAL LOW (ref 36.0–46.0)
Hemoglobin: 8.9 g/dL — ABNORMAL LOW (ref 12.0–15.0)
MCH: 26.8 pg (ref 26.0–34.0)
MCHC: 30.2 g/dL (ref 30.0–36.0)
MCV: 88.9 fL (ref 80.0–100.0)
Platelets: 364 10*3/uL (ref 150–400)
RBC: 3.32 MIL/uL — ABNORMAL LOW (ref 3.87–5.11)
RDW: 16.3 % — ABNORMAL HIGH (ref 11.5–15.5)
WBC: 9.3 10*3/uL (ref 4.0–10.5)
nRBC: 0 % (ref 0.0–0.2)

## 2022-11-16 LAB — GLUCOSE, CAPILLARY
Glucose-Capillary: 135 mg/dL — ABNORMAL HIGH (ref 70–99)
Glucose-Capillary: 179 mg/dL — ABNORMAL HIGH (ref 70–99)
Glucose-Capillary: 293 mg/dL — ABNORMAL HIGH (ref 70–99)
Glucose-Capillary: 239 mg/dL — ABNORMAL HIGH (ref 70–99)

## 2022-11-16 NOTE — Progress Notes (Signed)
Occupational Therapy Treatment Patient Details Name: Heidi Dominguez MRN: [id number] DOB: [birth year] Today's Date: 11/16/2022   History of present illness Heidi Dominguez is a 89 y.o. female admitted on 11/03/2022 with shoulder infection. 2x drains placed. PMHx: arthritis, CHF, CKD, Dyspnea, GERD, HOH, HTN, OA, bilateral shoulder replacements, bil TKR   OT comments  Patient required max assist to get to EOB and max assist x2 to stand in stedy. Once in [place] patient stated she needed to have a BM and was assisted to [facility]. Toilet hygiene performed seated and max assist of 2 to stand and transfer to recliner. Patient demonstrated improvement with grooming and self feeding while seated up in recliner. Patient will benefit from continued inpatient follow up therapy, <3 hours/day to further address functional transfers and grooming. Acute OT to continue to follow.     Recommendations for follow up therapy are one component of a multi-disciplinary discharge planning process, led by the attending physician.  Recommendations may be updated based on patient status, additional functional criteria and insurance authorization.    Assistance Recommended at Discharge Frequent or constant Supervision/Assistance  Patient can return home with the following  A lot of help with walking and/or transfers;Two people to help with walking and/or transfers;A lot of help with bathing/dressing/bathroom;Two people to help with bathing/dressing/bathroom;Assistance with cooking/housework;Assistance with feeding;Direct supervision/assist for medications management;Direct supervision/assist for financial management;Assist for transportation;Help with stairs or ramp for entrance   Equipment Recommendations  None recommended by OT    Recommendations for Other Services      Precautions / Restrictions Precautions Precautions: Fall Precaution Comments: 2x drains R shoulder Restrictions Weight Bearing Restrictions: No        Mobility Bed Mobility Overal bed mobility: Needs Assistance Bed Mobility: Supine to Sit     Supine to sit: Max assist, HOB elevated     General bed mobility comments: asistance with BLEs and to elevate trunk    Transfers Overall transfer level: Needs assistance Equipment used: Ambulation equipment used, 2 person hand held assist Transfers: Sit to/from Stand Sit to Stand: Max assist, +2 physical assistance, From elevated surface           General transfer comment: assisted to [facility] and then to recliner with Stedy and max assist x2     Balance Overall balance assessment: Needs assistance Sitting-balance support: Feet supported, No upper extremity supported Sitting balance-Leahy Scale: Fair Sitting balance - Comments: min assist for sitting balance on EOB due to posterior leaning Postural control: Posterior lean Standing balance support: During functional activity, Bilateral upper extremity supported, Reliant on assistive device for balance Standing balance-Leahy Scale: Zero Standing balance comment: max assist x2 for standing to stedy with use of bed pads to assist with hips                           ADL either performed or assessed with clinical judgement   ADL Overall ADL's : Needs assistance/impaired Eating/Feeding: Set up;Sitting Eating/Feeding Details (indicate cue type and reason): in recliner Grooming: Wash/dry hands;Wash/dry face;Supervision/safety;Sitting Grooming Details (indicate cue type and reason): in recliner                 Toilet Transfer: Maximal assistance;+2 for physical assistance;BSC/3in1 Toilet Transfer Details (indicate cue type and reason): assisted to [facility] with stedy with max assist x2 to stand Toileting- Clothing Manipulation and Hygiene: Maximal assistance;Sitting/lateral lean Toileting - Clothing Manipulation Details (indicate cue type and reason): assisted with toilet hygiene  while seated on [facility]             Extremity/Trunk Assessment              Vision       Perception     Praxis      Cognition Arousal/Alertness: Awake/alert Behavior During Therapy: WFL for tasks assessed/performed Overall Cognitive Status: No family/caregiver present to determine baseline cognitive functioning                                 General Comments: able to follow commands, [facility]        Exercises      Shoulder Instructions       General Comments      Pertinent Vitals/ Pain       Pain Assessment Pain Assessment: Faces Faces Pain Scale: Hurts little more Pain Location: discomfort, generalized Pain Descriptors / Indicators: Discomfort, Grimacing Pain Intervention(s): Limited activity within patient's tolerance, Monitored during session, Repositioned  Home Living                                          Prior Functioning/Environment              Frequency  Min 2X/week        Progress Toward Goals  OT Goals(current goals can now be found in the care plan section)  Progress towards OT goals: Progressing toward goals  Acute Rehab OT Goals Patient Stated Goal: none stated OT Goal Formulation: With patient Time For Goal Achievement: 11/20/22 Potential to Achieve Goals: Good ADL Goals Pt Will Perform Grooming: with set-up;sitting Pt Will Perform Upper Body Dressing: with min assist;sitting Additional ADL Goal #1: Pt will complete bed mobility with min A as a precursor to ADLs  Plan Discharge plan remains appropriate    Co-evaluation                 AM-PAC OT "6 Clicks" Daily Activity     Outcome Measure   Help from another person eating meals?: A Little Help from another person taking care of personal grooming?: A Little Help from another person toileting, which includes using toliet, bedpan, or urinal?: A Lot Help from another person bathing (including washing, rinsing, drying)?: A Lot Help from another person to put on and  taking off regular upper body clothing?: A Lot Help from another person to put on and taking off regular lower body clothing?: Total 6 Click Score: 13    End of Session Equipment Utilized During Treatment: Gait belt;Oxygen  OT Visit Diagnosis: Unsteadiness on feet (R26.81);Other abnormalities of gait and mobility (R26.89);Muscle weakness (generalized) (M62.81);Pain Pain - Right/Left: Right Pain - part of body: Shoulder   Activity Tolerance Patient tolerated treatment well   Patient Left in chair;with call bell/phone within reach;with chair alarm set   Nurse Communication Mobility status;Need for lift equipment        Time: 1212-1253 OT Time Calculation (min): 41 min  Charges: OT General Charges $OT Visit: 1 Visit OT Treatments $Self Care/Home Management : 23-37 mins $Therapeutic Activity: 8-22 mins  Lodema Hong, OTA Acute Rehabilitation Services  Office [phone]   Trixie Dredge 11/16/2022, 2:08 PM

## 2022-11-16 NOTE — Progress Notes (Signed)
  Progress Note   Patient: Heidi Dominguez [id number] DOB: 12-12-1920 DOA: 11/03/2022     13 DOS: the patient was seen and examined on 11/16/2022 at 8:45AM      Brief hospital course: Heidi Dominguez is a 89 y.o. F with dCHF, DM, HTN, CKD IIIa, hx PVD s/p tibial stent, and hx great toe osteo, hx bilateral shoulder replacement, previous right septic shoulder requiring antibiotic beads and recent RSV infection who presented with right shoulder prosthetic joint infection.           Assessment and Plan: * Right shoulder prosthetic joint infection - Continue cefadroxil  Hyponatremia Na improved today - Continue Lasix  Chronic diastolic CHF (congestive heart failure) Volume status hard to estimate.  Not able to be more aggressive with diureis given renal disease, age and hyponatremia. - Continue Lasix  Diabetes - Continue SS corrections  Chronic respiratory failure CXR today with worsening opacities, but no fever, no leukocytosis (and has been on antibiotics for 2 weeks).  She is not a candidate for a thoracentesis given her age and limited mobility if there is a pleural effusion there.  As above, not cancidate for more aggressive diuretics.  Thankfully, I think most of her hypoxia is from her kyphosis, shoulder pain and poor pulmonary toilet, not from progression of disease.            Subjective: No change.  Some vomiting today.     Physical Exam: BP 138/67 (BP Location: Left Arm)   Pulse 92   Temp 97.9 F (36.6 C)   Resp 16   Ht 5\' 4"  (1.626 m)   Wt 86.5 kg   SpO2 98%   BMI 32.73 kg/m   Elderly adult female, lying in bed, no acute distress RRR, loud systolic murmur, mild nonpitting peripheral edema Respiratory effort shallow, lung sounds diminished, inspiration very poor, no wheezing Abdomen soft no grimace to palpation     Data Reviewed: Chest x-ray, shows worsening opacities, large hiatal hernia Sodium up to 132 Hemoglobin stable at 8.9 Creatinine no  change White blood cell count down to 9  Family Communication: Called to daughter, no answer, left voicemail    Disposition: Status is: Inpatient         Author: Edwin Dada, MD 11/16/2022 6:04 PM  For on call review [email or web address].

## 2022-11-16 NOTE — Progress Notes (Signed)
Patient ID: Heidi Dominguez, female   DOB: 1921/04/26, 89 y.o.   MRN: [id number] [facility] Progress Note   Assessment/ Plan:   1.  Hyponatremia: Acute on chronic hypoosmolar hyponatremia in a hypervolemic patient.  Appears to be from a combination of non-osmolar ADH stimulation from nausea/pain along with hypervolemia in the setting of diastolic heart failure.  Sodium level improving sluggishly with furosemide/tolvaptan.  D/c tolvaptan 3/31; continue furosemide 40 mg IV twice daily for additional volume unloading. Still has some e/o volume overload but also not eating much. Have to wonder if solute intake is an issue with this pt.   Can continue furosemide 40mg  daily as she still has edema in the LE.  Likely has a reset osmostat with poor nutrition.  Nausea and vomiting overnight; surprisingly SNa is 132. SNa has been stable.  Signing off at this time; please reconsult as needed.   2.  Septic arthritis of right shoulder: Abscess drainage revealed E. coli infection for which she is currently on cefadroxil after earlier being on intravenous antibiotics.  No indication for open drainage per orthopedics. 3.  Chronic diastolic heart failure: Mild/trace lower extremity edema is present for which we will continue loop diuretic and oral fluid restriction. 4.  Hypertension: Blood pressures marginally elevated on amlodipine and furosemide.  Losartan on hold as we attempt to correct hyponatremia.  Subjective:   Had nausea/emesis yesterday and still c/o nausea this am.  Denies f/c.   Objective:   BP (!) 146/70 (BP Location: Right Arm)   Pulse 90   Temp 97.8 F (36.6 C)   Resp 16   Ht 5\' 4"  (1.626 m)   Wt 86.5 kg   SpO2 99%   BMI 32.73 kg/m   Intake/Output Summary (Last 24 hours) at 11/16/2022 0810 Last data filed at 11/16/2022 0410 Gross per 24 hour  Intake 250 ml  Output 550 ml  Net -300 ml   Weight change:   Physical Exam: Gen: Awake and appears to be  comfortable,  oxygen via nasal cannula;  pleasant and appropriate CVS: Pulse regular rhythm, normal rate, S1 and S2 normal Resp: Coarse breath sounds bilaterally with fine rales left base Abd: Soft, obese, nontender, bowel sounds normal Ext: tr-1+ pitting lower extremity edema.    Imaging: No results found.  Labs: BMET Recent Labs  Lab 11/10/22 0340 11/11/22 0617 11/12/22 0354 11/13/22 0351 11/14/22 0313 11/15/22 0355 11/16/22 0357  NA 126* 126* 127* 129* 128* 128* 132*  K 4.3 4.6 4.7 5.1 4.4 4.4 4.3  CL 88* 86* 89* 87* 86* 86* 86*  CO2 28 29 29 31  32 34* 35*  GLUCOSE 107* 175* 133* 119* 167* 158* 113*  BUN 16 22 25* 25* 23 23 20   CREATININE 0.76 0.88 0.85 0.76 0.77 0.80 0.68  CALCIUM 8.5* 8.9 8.9 9.3 9.2 9.2 9.4  PHOS 3.2 4.3 3.7  --  2.8 2.8  --    CBC Recent Labs  Lab 11/13/22 0351 11/15/22 0355 11/16/22 0357  WBC 13.6* 10.7* 9.3  NEUTROABS  --  8.7*  --   HGB 8.7* 8.8* 8.9*  HCT 28.5* 27.7* 29.5*  MCV 87.7 86.6 88.9  PLT 344 343 364    Medications:     acidophilus  1 capsule Oral QHS   amLODipine  10 mg Oral Daily   artificial tears   Both Eyes BID   aspirin EC  162 mg Oral QHS   budesonide  0.5 mg Nebulization BH-qamhs   carbamide peroxide  5 drop Both EARS BID   cefadroxil  1,000 mg Oral BID   dextromethorphan-guaiFENesin  1 tablet Oral BID   diclofenac Sodium  2 g Topical TID   docusate sodium  100 mg Oral BID   feeding supplement (GLUCERNA SHAKE)  237 mL Oral BID BM   heparin  5,000 Units Subcutaneous Q8H   insulin aspart  0-20 Units Subcutaneous TID WC   insulin aspart  3 Units Subcutaneous TID WC   insulin detemir  5 Units Subcutaneous Daily   ipratropium-albuterol  3 mL Nebulization BID   loratadine  10 mg Oral QHS   pantoprazole  40 mg Oral Daily   polyethylene glycol  17 g Oral BID   polyvinyl alcohol  2 drop Both Eyes Daily   senna  2 tablet Oral BID   sodium chloride flush  5 mL Intracatheter Q8H

## 2022-11-17 ENCOUNTER — Inpatient Hospital Stay (HOSPITAL_COMMUNITY)
Admission: EM | Admit: 2022-11-17 | Discharge: 2022-11-22 | DRG: 175 | Disposition: A | Discharge status: Skilled Nursing Facility | Payer: Medicare PPO | Source: Skilled Nursing Facility | Attending: Family Medicine | Admitting: Family Medicine

## 2022-11-17 ENCOUNTER — Other Ambulatory Visit: Payer: Self-pay

## 2022-11-17 ENCOUNTER — Emergency Department (HOSPITAL_COMMUNITY): Payer: Medicare PPO

## 2022-11-17 DIAGNOSIS — I2693 Single subsegmental pulmonary embolism without acute cor pulmonale: Secondary | ICD-10-CM | POA: Diagnosis not present

## 2022-11-17 DIAGNOSIS — K449 Diaphragmatic hernia without obstruction or gangrene: Secondary | ICD-10-CM | POA: Diagnosis present

## 2022-11-17 DIAGNOSIS — R011 Cardiac murmur, unspecified: Secondary | ICD-10-CM | POA: Diagnosis present

## 2022-11-17 DIAGNOSIS — Z96611 Presence of right artificial shoulder joint: Secondary | ICD-10-CM | POA: Diagnosis present

## 2022-11-17 DIAGNOSIS — Z7401 Bed confinement status: Secondary | ICD-10-CM

## 2022-11-17 DIAGNOSIS — Z833 Family history of diabetes mellitus: Secondary | ICD-10-CM

## 2022-11-17 DIAGNOSIS — Z9842 Cataract extraction status, left eye: Secondary | ICD-10-CM

## 2022-11-17 DIAGNOSIS — R072 Precordial pain: Secondary | ICD-10-CM

## 2022-11-17 DIAGNOSIS — I3139 Other pericardial effusion (noninflammatory): Secondary | ICD-10-CM | POA: Diagnosis present

## 2022-11-17 DIAGNOSIS — E7800 Pure hypercholesterolemia, unspecified: Secondary | ICD-10-CM | POA: Diagnosis present

## 2022-11-17 DIAGNOSIS — R262 Difficulty in walking, not elsewhere classified: Secondary | ICD-10-CM | POA: Diagnosis present

## 2022-11-17 DIAGNOSIS — Z8619 Personal history of other infectious and parasitic diseases: Secondary | ICD-10-CM

## 2022-11-17 DIAGNOSIS — E222 Syndrome of inappropriate secretion of antidiuretic hormone: Secondary | ICD-10-CM | POA: Diagnosis present

## 2022-11-17 DIAGNOSIS — M1990 Unspecified osteoarthritis, unspecified site: Secondary | ICD-10-CM | POA: Diagnosis present

## 2022-11-17 DIAGNOSIS — Z888 Allergy status to other drugs, medicaments and biological substances status: Secondary | ICD-10-CM

## 2022-11-17 DIAGNOSIS — Z9841 Cataract extraction status, right eye: Secondary | ICD-10-CM

## 2022-11-17 DIAGNOSIS — Z9049 Acquired absence of other specified parts of digestive tract: Secondary | ICD-10-CM

## 2022-11-17 DIAGNOSIS — D649 Anemia, unspecified: Secondary | ICD-10-CM | POA: Diagnosis present

## 2022-11-17 DIAGNOSIS — I2699 Other pulmonary embolism without acute cor pulmonale: Secondary | ICD-10-CM | POA: Diagnosis present

## 2022-11-17 DIAGNOSIS — Z87898 Personal history of other specified conditions: Secondary | ICD-10-CM

## 2022-11-17 DIAGNOSIS — E8809 Other disorders of plasma-protein metabolism, not elsewhere classified: Secondary | ICD-10-CM | POA: Diagnosis present

## 2022-11-17 DIAGNOSIS — D631 Anemia in chronic kidney disease: Secondary | ICD-10-CM | POA: Diagnosis present

## 2022-11-17 DIAGNOSIS — Z79899 Other long term (current) drug therapy: Secondary | ICD-10-CM

## 2022-11-17 DIAGNOSIS — Z885 Allergy status to narcotic agent status: Secondary | ICD-10-CM

## 2022-11-17 DIAGNOSIS — I130 Hypertensive heart and chronic kidney disease with heart failure and stage 1 through stage 4 chronic kidney disease, or unspecified chronic kidney disease: Secondary | ICD-10-CM | POA: Diagnosis present

## 2022-11-17 DIAGNOSIS — R410 Disorientation, unspecified: Secondary | ICD-10-CM

## 2022-11-17 DIAGNOSIS — G8929 Other chronic pain: Secondary | ICD-10-CM | POA: Diagnosis present

## 2022-11-17 DIAGNOSIS — Z96612 Presence of left artificial shoulder joint: Secondary | ICD-10-CM | POA: Diagnosis present

## 2022-11-17 DIAGNOSIS — J441 Chronic obstructive pulmonary disease with (acute) exacerbation: Secondary | ICD-10-CM | POA: Diagnosis present

## 2022-11-17 DIAGNOSIS — Z7982 Long term (current) use of aspirin: Secondary | ICD-10-CM

## 2022-11-17 DIAGNOSIS — Z7984 Long term (current) use of oral hypoglycemic drugs: Secondary | ICD-10-CM

## 2022-11-17 DIAGNOSIS — T8459XA Infection and inflammatory reaction due to other internal joint prosthesis, initial encounter: Secondary | ICD-10-CM | POA: Diagnosis present

## 2022-11-17 DIAGNOSIS — Z882 Allergy status to sulfonamides status: Secondary | ICD-10-CM

## 2022-11-17 DIAGNOSIS — Z880 Allergy status to penicillin: Secondary | ICD-10-CM

## 2022-11-17 DIAGNOSIS — R0789 Other chest pain: Secondary | ICD-10-CM | POA: Diagnosis not present

## 2022-11-17 DIAGNOSIS — E669 Obesity, unspecified: Secondary | ICD-10-CM | POA: Diagnosis present

## 2022-11-17 DIAGNOSIS — Z8249 Family history of ischemic heart disease and other diseases of the circulatory system: Secondary | ICD-10-CM

## 2022-11-17 DIAGNOSIS — N1831 Chronic kidney disease, stage 3a: Secondary | ICD-10-CM | POA: Diagnosis present

## 2022-11-17 DIAGNOSIS — Z96653 Presence of artificial knee joint, bilateral: Secondary | ICD-10-CM | POA: Diagnosis present

## 2022-11-17 DIAGNOSIS — Z66 Do not resuscitate: Secondary | ICD-10-CM | POA: Diagnosis present

## 2022-11-17 DIAGNOSIS — H04129 Dry eye syndrome of unspecified lacrimal gland: Secondary | ICD-10-CM | POA: Diagnosis present

## 2022-11-17 DIAGNOSIS — Z794 Long term (current) use of insulin: Secondary | ICD-10-CM

## 2022-11-17 DIAGNOSIS — J9622 Acute and chronic respiratory failure with hypercapnia: Secondary | ICD-10-CM | POA: Diagnosis present

## 2022-11-17 DIAGNOSIS — Z6832 Body mass index (BMI) 32.0-32.9, adult: Secondary | ICD-10-CM

## 2022-11-17 DIAGNOSIS — E1122 Type 2 diabetes mellitus with diabetic chronic kidney disease: Secondary | ICD-10-CM | POA: Diagnosis present

## 2022-11-17 DIAGNOSIS — I5033 Acute on chronic diastolic (congestive) heart failure: Secondary | ICD-10-CM | POA: Diagnosis present

## 2022-11-17 DIAGNOSIS — F0390 Unspecified dementia without behavioral disturbance: Secondary | ICD-10-CM | POA: Diagnosis present

## 2022-11-17 DIAGNOSIS — Z7951 Long term (current) use of inhaled steroids: Secondary | ICD-10-CM

## 2022-11-17 DIAGNOSIS — Z961 Presence of intraocular lens: Secondary | ICD-10-CM | POA: Diagnosis present

## 2022-11-17 DIAGNOSIS — N3281 Overactive bladder: Secondary | ICD-10-CM | POA: Diagnosis present

## 2022-11-17 DIAGNOSIS — I2729 Other secondary pulmonary hypertension: Secondary | ICD-10-CM | POA: Diagnosis present

## 2022-11-17 DIAGNOSIS — Y831 Surgical operation with implant of artificial internal device as the cause of abnormal reaction of the patient, or of later complication, without mention of misadventure at the time of the procedure: Secondary | ICD-10-CM | POA: Diagnosis present

## 2022-11-17 DIAGNOSIS — R5381 Other malaise: Secondary | ICD-10-CM | POA: Diagnosis present

## 2022-11-17 DIAGNOSIS — N183 Chronic kidney disease, stage 3 unspecified: Secondary | ICD-10-CM | POA: Diagnosis present

## 2022-11-17 DIAGNOSIS — T8450XA Infection and inflammatory reaction due to unspecified internal joint prosthesis, initial encounter: Secondary | ICD-10-CM | POA: Diagnosis present

## 2022-11-17 DIAGNOSIS — I5082 Biventricular heart failure: Secondary | ICD-10-CM | POA: Diagnosis present

## 2022-11-17 DIAGNOSIS — J449 Chronic obstructive pulmonary disease, unspecified: Secondary | ICD-10-CM | POA: Diagnosis present

## 2022-11-17 DIAGNOSIS — I10 Essential (primary) hypertension: Secondary | ICD-10-CM | POA: Diagnosis present

## 2022-11-17 DIAGNOSIS — K219 Gastro-esophageal reflux disease without esophagitis: Secondary | ICD-10-CM | POA: Diagnosis present

## 2022-11-17 DIAGNOSIS — J9621 Acute and chronic respiratory failure with hypoxia: Secondary | ICD-10-CM | POA: Diagnosis present

## 2022-11-17 DIAGNOSIS — Z8672 Personal history of thrombophlebitis: Secondary | ICD-10-CM

## 2022-11-17 DIAGNOSIS — E119 Type 2 diabetes mellitus without complications: Secondary | ICD-10-CM

## 2022-11-17 DIAGNOSIS — H9110 Presbycusis, unspecified ear: Secondary | ICD-10-CM | POA: Diagnosis present

## 2022-11-17 DIAGNOSIS — Z90710 Acquired absence of both cervix and uterus: Secondary | ICD-10-CM

## 2022-11-17 DIAGNOSIS — I350 Nonrheumatic aortic (valve) stenosis: Secondary | ICD-10-CM

## 2022-11-17 DIAGNOSIS — E1151 Type 2 diabetes mellitus with diabetic peripheral angiopathy without gangrene: Secondary | ICD-10-CM | POA: Diagnosis present

## 2022-11-17 DIAGNOSIS — R1310 Dysphagia, unspecified: Secondary | ICD-10-CM | POA: Diagnosis present

## 2022-11-17 DIAGNOSIS — Z1152 Encounter for screening for COVID-19: Secondary | ICD-10-CM

## 2022-11-17 DIAGNOSIS — Z87440 Personal history of urinary (tract) infections: Secondary | ICD-10-CM

## 2022-11-17 LAB — COMPREHENSIVE METABOLIC PANEL
ALT: 16 U/L (ref 0–44)
AST: 17 U/L (ref 15–41)
Albumin: 2.2 g/dL — ABNORMAL LOW (ref 3.5–5.0)
Alkaline Phosphatase: 63 U/L (ref 38–126)
Anion gap: 10 (ref 5–15)
BUN: 15 mg/dL (ref 8–23)
CO2: 37 mmol/L — ABNORMAL HIGH (ref 22–32)
Calcium: 9.5 mg/dL (ref 8.9–10.3)
Chloride: 87 mmol/L — ABNORMAL LOW (ref 98–111)
Creatinine, Ser: 0.59 mg/dL (ref 0.44–1.00)
GFR, Estimated: >60 mL/min (ref >60)
Glucose, Bld: 165 mg/dL — ABNORMAL HIGH (ref 70–99)
Potassium: 4.7 mmol/L (ref 3.5–5.1)
Sodium: 134 mmol/L — ABNORMAL LOW (ref 135–145)
Total Bilirubin: 0.5 mg/dL (ref 0.3–1.2)
Total Protein: 6.5 g/dL (ref 6.5–8.1)

## 2022-11-17 LAB — BASIC METABOLIC PANEL
Anion gap: 9 (ref 5–15)
BUN: 16 mg/dL (ref 8–23)
CO2: 38 mmol/L — ABNORMAL HIGH (ref 22–32)
Calcium: 9.3 mg/dL (ref 8.9–10.3)
Chloride: 87 mmol/L — ABNORMAL LOW (ref 98–111)
Creatinine, Ser: 0.55 mg/dL (ref 0.44–1.00)
GFR, Estimated: >60 mL/min (ref >60)
Glucose, Bld: 187 mg/dL — ABNORMAL HIGH (ref 70–99)
Potassium: 4.5 mmol/L (ref 3.5–5.1)
Sodium: 134 mmol/L — ABNORMAL LOW (ref 135–145)

## 2022-11-17 LAB — AEROBIC/ANAEROBIC CULTURE W GRAM STAIN (SURGICAL/DEEP WOUND)

## 2022-11-17 LAB — GLUCOSE, CAPILLARY
Glucose-Capillary: 181 mg/dL — ABNORMAL HIGH (ref 70–99)
Glucose-Capillary: 221 mg/dL — ABNORMAL HIGH (ref 70–99)

## 2022-11-17 LAB — CBC
HCT: 29.3 % — ABNORMAL LOW (ref 36.0–46.0)
HCT: 30 % — ABNORMAL LOW (ref 36.0–46.0)
Hemoglobin: 8.8 g/dL — ABNORMAL LOW (ref 12.0–15.0)
Hemoglobin: 9 g/dL — ABNORMAL LOW (ref 12.0–15.0)
MCH: 27.2 pg (ref 26.0–34.0)
MCH: 26.9 pg (ref 26.0–34.0)
MCHC: 30 g/dL (ref 30.0–36.0)
MCHC: 30 g/dL (ref 30.0–36.0)
MCV: 90.7 fL (ref 80.0–100.0)
MCV: 89.8 fL (ref 80.0–100.0)
Platelets: 373 10*3/uL (ref 150–400)
Platelets: 382 10*3/uL (ref 150–400)
RBC: 3.23 MIL/uL — ABNORMAL LOW (ref 3.87–5.11)
RBC: 3.34 MIL/uL — ABNORMAL LOW (ref 3.87–5.11)
RDW: 16.4 % — ABNORMAL HIGH (ref 11.5–15.5)
RDW: 16.5 % — ABNORMAL HIGH (ref 11.5–15.5)
WBC: 7.3 10*3/uL (ref 4.0–10.5)
WBC: 10.1 10*3/uL (ref 4.0–10.5)
nRBC: 0 % (ref 0.0–0.2)
nRBC: 0 % (ref 0.0–0.2)

## 2022-11-17 LAB — TROPONIN I (HIGH SENSITIVITY): Troponin I (High Sensitivity): 17 ng/L (ref <18)

## 2022-11-17 LAB — LIPASE, BLOOD: Lipase: 20 U/L (ref 11–51)

## 2022-11-17 MED ORDER — FUROSEMIDE 20 MG PO TABS: 20.0000 mg | ORAL_TABLET | Freq: Every day | ORAL | Status: DC

## 2022-11-17 NOTE — Discharge Summary (Signed)
Physician Discharge Summary   Patient: Heidi Dominguez MRN: [id number] DOB: [birth year]  Admit date:     11/03/2022  Discharge date: 11/17/22  Discharge Physician: Alberteen Sam   PCP: Merlene Laughter, MD     Recommendations at discharge:  Obtain BMP in 4 days and fax to Dr. Juel Burrow at [facility] Arrange follow up with Dr. Juel Burrow at [facility] in 2 weeks Arrange follow up with Dr. Aletha Halim in 1-2 weeks Arrange follow up with Dr. Nobie Putnam for Infectious Disease in 2 weeks Arrange follow up with [facility] in 2-4 weeks for hypoxia Follow speech therapy instructions below Please perform twice daily oral care  Dr. Thedore Mins:  Please follow up aspirate culture at 14 days  Continue cefadroxil 1000 mg BID x 6 weeks, end of treatment 12/15/22, then transition to cefadroxil 500 mg po BID indefinitely for palliative suppression given hardware in place     Discharge Diagnoses: Principal Problem:   Right shoulder prosthetic joint infection Active Problems:   Hyponatremia   Essential hypertension   Diabetes mellitus type 2, controlled   Presbycusis of both ears   CKD (chronic kidney disease) stage 3, GFR 30-59 ml/min   COPD (chronic obstructive pulmonary disease)   Chronic respiratory failure with hypoxia   Acute on chronic diastolic CHF (congestive heart failure)   Nausea & vomiting   Delirium   Normocytic anemia       Hospital Course: Mrs. Heidi Dominguez is a 89 y.o. F with dCHF, DM, HTN, CKD IIIa, hx PVD s/p tibial stent, and hx great toe osteo, hx bilateral shoulder replacement, previous right septic shoulder requiring antibiotic beads and recent RSV infection who presented with right shoulder pain.      3/22: In ER, CT showed large shoulder fluid collection; Admitted on abx, Ortho consulted 3/23: IR consulted, underwent US guided drain placement in right shoulder abscesses 3/24: Culture growing E coli, ID consulted 3/26: Nephrology  consulted for hyponatremia 3/31: GI consulted for persistent N/V      * Right shoulder prosthetic joint infection Possible septic arthritis of right shoulder H/o bilateral shoulder replacement and prior infection right prosthetic shoulder.   Ortho and ID were consulted.  She is not an operative candidate for removal of hardware or debridement.  IR were consulted and aspirated her fluid collection which grew E coli.  ID recommended 6 weeks cefadroxil, outpatient follow up and likely lifelong suppressive therapy after that.       Hyponatremia Evaluated by Nephrology and treated with tolvaptan and furosemide.  Nephrology recommend furosemide at discharge, repeat BMP in 4 days and follow up in the office in 2 weeks.       Acute on chronic diastolic CHF (congestive heart failure) Discharged on daily Lasix.    Dysphagia, nausea and vomiting Evaluated by SLP and GI.    Speech therapy recommend normal consistency diet with thin liquids.  They recommend meds in puree and twice daily oral care.  GI note that barium swallow was not possible to perform due to patient level factors, EGD was not possible due to risks of sedation and risk of dilation.  Likely hiatal hernia (also not modifiable) contributing to dysphagia as well.   Ambulatory dysfunction Family report that the patient was working frequently with PT in acute rehab prior to admission, was sitting up and was more alert and functional prior to admission.  Acute rehab denied at this time, family plan to appeal.     Delirium At baseline  patient is conversational and appropriate.  At times she was disoriented in the hospital.  Improved by the time of discharge but still more somnolent than baseline.    Chronic respiratory failure Acute respiratory failure ruled out.  This appears to be multifactorial from her CHF, atelectasis/deconditioning, hiatal hernia, and silent aspiration.  Unfortunately, all of these things are  not amenable to further treatment.  Her presentation to [facility] clinic (wheezing in last 10 years, prominent cough, not associated with asthma, smoking) and very large hiatal hernia make it likely that some degree of this is hiatal hernia related lung disease.  The esophageal narrowing noted by GI as well as her age and severe deconditioning also put her at risk for aspiration related lung disease.            The [facility] Controlled Substances Registry was reviewed for this patient prior to discharge.  Consultants:  GI Dr. Armanda [facility] Dr. Ranell Patrick Infectious disease, Dr. Renold Don Nephrology, Dr. Allena Katz and Dr. Juel Burrow   Procedures performed: Horace Porteous placement  Disposition: Skilled nursing facility Diet recommendation: Regular   DISCHARGE MEDICATION: Allergies as of 11/17/2022       Reactions   Augmentin [amoxicillin-pot Clavulanate] Diarrhea   Bactrim [sulfamethoxazole-trimethoprim] Diarrhea, Itching   Calan [verapamil] Diarrhea   Catapres [clonidine Hcl] Other (See Comments)   Unknown reaction Not documented on MAR   Codeine Other (See Comments)   "Makes me out of my head, loopy"   Hydrocodone Other (See Comments)   "makes me go out of my head, loopy"   Other Other (See Comments)   Any type of narcotics Feels "loopy"   Sulfa Antibiotics Itching   Zestril [lisinopril] Cough   Azulfidine [sulfasalazine] Itching        Medication List     STOP taking these medications    guaiFENesin 100 MG/5ML liquid Commonly known as: ROBITUSSIN   losartan 50 MG tablet Commonly known as: COZAAR       TAKE these medications    acetaminophen 325 MG tablet Commonly known as: TYLENOL Take 650 mg by mouth 3 (three) times daily.   albuterol 108 (90 Base) MCG/ACT inhaler Commonly known as: VENTOLIN HFA Inhale 2 puffs into the lungs every 6 (six) hours as needed for wheezing or shortness of breath.   amLODipine 10 MG tablet Commonly known as: NORVASC Take 10 mg  by mouth daily. HOLD if SBP < 110.   aspirin EC 81 MG tablet Take 162 mg by mouth at bedtime.   budesonide 0.5 MG/2ML nebulizer solution Commonly known as: Pulmicort Take 2 mLs (0.5 mg total) by nebulization in the morning and at bedtime.   cefadroxil 500 MG capsule Commonly known as: DURICEF Take 2 capsules (1,000 mg total) by mouth 2 (two) times daily.   Cinnamon 500 MG capsule Take 500 mg by mouth daily.   dextromethorphan-guaiFENesin 30-600 MG 12hr tablet Commonly known as: MUCINEX DM Take 1 tablet by mouth 2 (two) times daily.   furosemide 20 MG tablet Commonly known as: LASIX Take 1 tablet (20 mg total) by mouth daily.   GenTeal Severe 0.3 % Gel ophthalmic ointment Generic drug: hypromellose Place 1 Application into both eyes 2 (two) times daily.   HumaLOG 100 UNIT/ML injection Generic drug: insulin lispro Inject 0-12 Units into the skin 3 (three) times daily before meals. Per sliding scale: If blood sugar is less than 70, call NP/PA. If blood sugar is 70 to 200, give 0 units. If blood sugar is 201 to  250, give 2 units. If blood sugar is 251 to 300, give 4 units. If blood sugar is 301 to 350, give 6 units. If blood sugar is 351 to 400, give 8 units. If blood sugar is 401 to 450, give 10 units. If blood sugar is 451 to 600, give 12 units. Recheck in 2 hours.   ipratropium-albuterol 0.5-2.5 (3) MG/3ML Soln Commonly known as: DUONEB Take 3 mLs by nebulization 2 (two) times daily.   Levemir FlexPen 100 UNIT/ML FlexPen Generic drug: insulin detemir Inject 5 Units into the skin daily.   loratadine 10 MG tablet Commonly known as: CLARITIN Take 10 mg by mouth at bedtime.   Lubricant Eye Drops 0.4-0.3 % Soln Generic drug: Polyethyl Glycol-Propyl Glycol Place 2 drops into both eyes daily.   metFORMIN 500 MG tablet Commonly known as: GLUCOPHAGE Take 0.5 tablets (250 mg total) by mouth 2 (two) times daily with a meal. What changed: how much to take   OMEPRAZOLE  PO Take 20 mg by mouth daily. Omeprazole 20 mg DR disintegrating tablet.   ondansetron 4 MG tablet Commonly known as: ZOFRAN Take 4 mg by mouth every 8 (eight) hours as needed for nausea or vomiting.   polyethylene glycol powder 17 GM/SCOOP powder Commonly known as: GLYCOLAX/MIRALAX Take 17 g by mouth at bedtime.   polyvinyl alcohol 1.4 % ophthalmic solution Commonly known as: LIQUIFILM TEARS Place 1 drop into both eyes in the morning, at noon, in the evening, and at bedtime. Following injection treatment   PreserVision AREDS Caps Take 1 capsule by mouth daily.   Probiotic Acidophilus Caps Take 1 capsule by mouth at bedtime.   Vitamin D3 25 MCG (1000 UT) capsule Generic drug: Cholecalciferol Take 1,000 Units by mouth every Monday.   Voltaren Arthritis Pain 1 % Gel Generic drug: diclofenac Sodium Apply 2 g topically 3 (three) times daily. To both shoulders.               Discharge Care Instructions  (From admission, onward)           Start     Ordered   11/15/22 0000  Discharge wound care:       Comments: Cover shoulder wounds with simple bandage (ie bandaid or similar), change every 2 days or if soiled until no longer visible   11/15/22 1610            Follow-up Information     Beverely Low, MD Follow up.   Specialty: Orthopedic Surgery Contact information: [address] [place] [address] [phone]         Danelle Earthly, MD .   Specialty: Infectious Diseases Contact information: [address] [place] [address] [phone]                 Discharge Instructions     Discharge wound care:   Complete by: As directed    Cover shoulder wounds with simple bandage (ie bandaid or similar), change every 2 days or if soiled until no longer visible       Discharge Exam: Filed Weights   11/03/22 1749  Weight: 86.5 kg    General: Elderly adult female, lying in bed, eating yogurt.  No acute  distress Cardiovascular: RRR, loud systolic murmur, mild nonpitting LE edema, no JVD  Respiratory: Respiratory effort shallow. Lung sounds diminished, no rales or wheezing appreciated, but lung sounds diminished. Abdominal: Abdomen soft and nontender.  No distension. Neuro/Psych: Very hard of hearing.  Severe  generalized weakness.  Responds to some of my questions, not others, strength testing limited by debility in the legs and pain in the right shoulder.    Condition at discharge: stable  The results of significant diagnostics from this hospitalization (including imaging, microbiology, ancillary and laboratory) are listed below for reference.   Imaging Studies: DG CHEST PORT 1 VIEW  Result Date: 11/16/2022 CLINICAL DATA:  Hypoxia. EXAM: PORTABLE CHEST 1 VIEW COMPARISON:  Radiograph 11/13/2022, chest CT 03/16/2021 FINDINGS: Retrocardiac opacity is likely related to large hiatal hernia. There may be slight increased density in the lateral left lung base compared to prior exam. Stable heart size and mediastinal contours with prominent aortic atherosclerosis. Increasing right upper lobe airspace disease abutting the fissure in the right mid lung. Similar peribronchial thickening which may be pulmonary edema. Suspected small right pleural effusion. Bilateral humeral head arthroplasties, abutting the undersurface of the clavicle on the right, chronic. IMPRESSION: 1. Increasing right upper lobe airspace disease suspicious for pneumonia. 2. Peribronchial thickening may be pulmonary edema, similar. 3. Probable small right pleural effusion. 4. Left retrocardiac opacity is likely due to large hiatal hernia. There is slight increasing density in the left lateral lung base which may be due to effusion or adjacent atelectasis/airspace disease. Electronically Signed   By: Narda Rutherford M.D.   On: 11/16/2022 17:36   DG ESOPHAGUS W SINGLE CM (SOL OR THIN BA)  Result Date: 11/13/2022 CLINICAL DATA:  Provided  history: Vomiting. Swallowing air. Additional history provided: History of esophageal dysphagia/dysfunction. EXAM: ESOPHAGUS/BARIUM SWALLOW/TABLET STUDY TECHNIQUE: A single contrast examination was performed using thin liquid barium. The exam was performed by Alex Gardener, NP, and was supervised and interpreted by Dr. Jackey Loge. FLUOROSCOPY: Fluoroscopy time: 1 minute, 12 seconds (17.8 mGy). COMPARISON:  Modified barium swallow 07/20/2022. Upper GI series 06/20/2017. Esophagram 03/27/2008. FINDINGS: Limited study due to the patient's inability to stand and limited ability to reposition on the fluoroscopy table. Additionally, a 13 mm barium tablet was not administered (nursing staff reports the patient is unable to swallow pills unless crushed). Mildly patulous esophagus. Focal narrowing of the distal esophagus compatible with stricture, similar to the prior upper GI series of 06/20/2017. It is estimated at the esophagus is narrowed to 5-7 mm at this site. Prominent intermittent esophageal dysmotility with tertiary contractions. Redemonstrated large hiatal hernia. No gastroesophageal reflux was observed. IMPRESSION: 1. Limited examination due to the patient's inability to stand and limited ability to reposition on the fluoroscopy table. Additionally, a 13 mm barium tablet was not administered (nursing staff reports the patient is unable to swallow pills unless crushed). 2. Focal narrowing of the distal esophagus compatible with stricture, similar to the prior upper GI series 06/20/2017. It is estimated that the esophagus is narrowed to 5-7 mm at this site. 3. Mildly patulous esophagus elsewhere. 4. Prominent esophageal dysmotility with tertiary contractions. 5. Redemonstrated large hiatal hernia. Electronically Signed   By: Jackey Loge D.O.   On: 11/13/2022 12:15   DG CHEST PORT 1 VIEW  Result Date: 11/13/2022 CLINICAL DATA:  Acute respiratory failure with hypoxia. EXAM: PORTABLE CHEST 1 VIEW COMPARISON:   11/11/2022. FINDINGS: Low lung volumes accentuate the pulmonary vasculature and cardiomediastinal silhouette. Unchanged mild interstitial pulmonary edema and small bilateral pleural effusions. Unchanged retrocardiac opacity, likely atelectasis. Stable cardiac and mediastinal contours. No pneumothorax. IMPRESSION: Unchanged mild interstitial pulmonary edema and small bilateral pleural effusions. Electronically Signed   By: Orvan Falconer M.D.   On: 11/13/2022 10:55   DG Chest 1 View  Result Date: 11/11/2022 CLINICAL DATA:  Shortness of breath EXAM: PORTABLE CHEST 1 VIEW COMPARISON:  11/03/22 FINDINGS: Cardiac shadow is enlarged but stable. Aortic calcifications are again seen. Lungs are well aerated bilaterally. Slight increase in central vascular congestion is noted with edema. Large hiatal hernia is noted. IMPRESSION: Slight increase in central vascular congestion with mild edema. Large hiatal hernia. Electronically Signed   By: Alcide CleverMark  Lukens M.D.   On: 11/11/2022 01:16   ECHOCARDIOGRAM COMPLETE  Result Date: 11/09/2022    ECHOCARDIOGRAM REPORT   Patient Name:   Cleatrice BurkeCORA S Barth Date of Exam: 11/09/2022 Medical Rec #:  [id number]       Height:       64.0 in Accession #:    [id number]      Weight:       190.7 lb Date of Birth:  10/19/1920       BSA:          1.917 m Patient Age:    89 years        BP:           135/74 mmHg Patient Gender: F               HR:           90 bpm. Exam Location:  Inpatient Procedure: 2D Echo, 3D Echo, Cardiac Doppler, Color Doppler and Intracardiac            Opacification Agent Indications:     CHF/Aortic Stenosis  History:         Patient has prior history of Echocardiogram examinations, most                  recent 04/09/2015. CHF, Signs/Symptoms:Murmur and Dyspnea; Risk                  Factors:Diabetes and Hypertension. CKD.  Sonographer:     Milda SmartShannon O'Grady Referring Phys:  [id number] Tyrone NineYAN B GRUNZ Diagnosing Phys: Dina RichJonathan Branch MD  Sonographer Comments: Image acquisition challenging  due to patient body habitus and Image acquisition challenging due to respiratory motion. IMPRESSIONS  1. Left ventricular ejection fraction, by estimation, is 65 to 70%. The left ventricle has normal function. The left ventricle has no regional wall motion abnormalities. There is mild left ventricular hypertrophy. Left ventricular diastolic parameters are consistent with Grade I diastolic dysfunction (impaired relaxation). Elevated left atrial pressure.  2. Right ventricular systolic function is normal. The right ventricular size is normal. There is mildly elevated pulmonary artery systolic pressure.  3. Left atrial size was severely dilated.  4. A small pericardial effusion is present. The pericardial effusion is circumferential. There is no evidence of cardiac tamponade.  5. The mitral valve is abnormal. Trivial mitral valve regurgitation. Moderate mitral stenosis. The mean mitral valve gradient is 6.0 mmHg.HR 87 bpm  6. The tricuspid valve is abnormal.  7. The aortic valve is tricuspid. There is moderate calcification of the aortic valve. There is moderate thickening of the aortic valve. Aortic valve regurgitation is not visualized. Severe aortic valve stenosis. Aortic valve mean gradient measures 64.0  mmHg. Aortic valve peak gradient measures 91.6 mmHg. Aortic valve area, by VTI measures 0.94 cm.  8. The pulmonic valve was abnormal.  9. The inferior vena cava is dilated in size with >50% respiratory variability, suggesting right atrial pressure of 8 mmHg. FINDINGS  Left Ventricle: Left ventricular ejection fraction, by estimation, is 65 to 70%. The left ventricle has normal function. The left ventricle has  no regional wall motion abnormalities. Definity contrast agent was given IV to delineate the left ventricular  endocardial borders. The left ventricular internal cavity size was normal in size. There is mild left ventricular hypertrophy. Left ventricular diastolic parameters are consistent with Grade I  diastolic dysfunction (impaired relaxation). Elevated left atrial pressure. Right Ventricle: The right ventricular size is normal. Right vetricular wall thickness was not well visualized. Right ventricular systolic function is normal. There is mildly elevated pulmonary artery systolic pressure. The tricuspid regurgitant velocity  is 3.00 m/s, and with an assumed right atrial pressure of 8 mmHg, the estimated right ventricular systolic pressure is 44.0 mmHg. Left Atrium: Left atrial size was severely dilated. Right Atrium: Right atrial size was normal in size. Pericardium: A small pericardial effusion is present. The pericardial effusion is circumferential. There is no evidence of cardiac tamponade. Mitral Valve: The mitral valve is abnormal. Trivial mitral valve regurgitation. Moderate mitral valve stenosis. MV peak gradient, 16.9 mmHg. The mean mitral valve gradient is 6.0 mmHg. Tricuspid Valve: The tricuspid valve is abnormal. Tricuspid valve regurgitation is mild . No evidence of tricuspid stenosis. Aortic Valve: The aortic valve is tricuspid. There is moderate calcification of the aortic valve. There is moderate thickening of the aortic valve. There is moderate aortic valve annular calcification. Aortic valve regurgitation is not visualized. Severe  aortic stenosis is present. Aortic valve mean gradient measures 64.0 mmHg. Aortic valve peak gradient measures 91.6 mmHg. Aortic valve area, by VTI measures 0.94 cm. Pulmonic Valve: The pulmonic valve was abnormal. Pulmonic valve regurgitation is mild. No evidence of pulmonic stenosis. Aorta: The aortic root and ascending aorta are structurally normal, with no evidence of dilitation. Venous: The inferior vena cava is dilated in size with greater than 50% respiratory variability, suggesting right atrial pressure of 8 mmHg. IAS/Shunts: No atrial level shunt detected by color flow Doppler.  LEFT VENTRICLE PLAX 2D LVIDd:         3.60 cm     Diastology LVIDs:          2.60 cm     LV e' medial:    4.57 cm/s LV PW:         1.10 cm     LV E/e' medial:  27.1 LV IVS:        1.10 cm     LV e' lateral:   4.46 cm/s LVOT diam:     2.00 cm     LV E/e' lateral: 27.8 LV SV:         92 LV SV Index:   48 LVOT Area:     3.14 cm  LV Volumes (MOD) LV vol d, MOD A2C: 72.9 ml LV vol d, MOD A4C: 82.1 ml LV vol s, MOD A2C: 18.7 ml LV vol s, MOD A4C: 17.5 ml LV SV MOD A2C:     54.2 ml LV SV MOD A4C:     82.1 ml LV SV MOD BP:      59.9 ml RIGHT VENTRICLE             IVC RV S prime:     13.90 cm/s  IVC diam: 2.30 cm TAPSE (M-mode): 1.5 cm LEFT ATRIUM              Index        RIGHT ATRIUM           Index LA diam:        4.70 cm  2.45 cm/m   RA Area:  20.90 cm LA Vol (A2C):   135.0 ml 70.42 ml/m  RA Volume:   64.70 ml  33.75 ml/m LA Vol (A4C):   103.0 ml 53.72 ml/m LA Biplane Vol: 121.0 ml 63.11 ml/m  AORTIC VALVE                     PULMONIC VALVE AV Area (Vmax):    0.91 cm      PR End Diast Vel: 5.76 msec AV Area (Vmean):   0.92 cm AV Area (VTI):     0.94 cm AV Vmax:           478.50 cm/s AV Vmean:          363.500 cm/s AV VTI:            0.987 m AV Peak Grad:      91.6 mmHg AV Mean Grad:      64.0 mmHg LVOT Vmax:         139.00 cm/s LVOT Vmean:        107.000 cm/s LVOT VTI:          0.294 m LVOT/AV VTI ratio: 0.30  AORTA Ao Root diam: 2.90 cm Ao Asc diam:  3.40 cm MITRAL VALVE                TRICUSPID VALVE MV Area VTI:  1.87 cm      TR Peak grad:   36.0 mmHg MV Peak grad: 16.9 mmHg     TR Mean grad:   27.0 mmHg MV Mean grad: 6.0 mmHg      TR Vmax:        300.00 cm/s MV Vmax:      2.05 m/s      TR Vmean:       255.0 cm/s MV Vmean:     114.5 cm/s MR Peak grad: 65.9 mmHg     SHUNTS MR Vmax:      406.00 cm/s   Systemic VTI:  0.29 m MV E velocity: 124.00 cm/s  Systemic Diam: 2.00 cm Dina Rich MD Electronically signed by Dina Rich MD Signature Date/Time: 11/09/2022/3:03:42 PM    Final (Updated)    DG Abd 1 View  Result Date: 11/05/2022 CLINICAL DATA:  Constipation for several  days EXAM: ABDOMEN - 1 VIEW COMPARISON:  09/16/2018 FINDINGS: Scattered large and small bowel gas is noted. Retained fecal material is noted throughout the colon consistent with a moderate degree of constipation. No obstructive changes are seen. No free air is noted. Changes of prior vertebral augmentation are seen and stable. Degenerative changes of lumbar spine are noted. IMPRESSION: Changes consistent with moderate colonic constipation. Electronically Signed   By: Alcide Clever M.D.   On: 11/05/2022 20:27   IR US Guide Bx Asp/Drain  Result Date: 11/04/2022 INDICATION: 89 year old female with right shoulder periprosthetic fluid collections that are suggestive for abscesses. EXAM: 1. Ultrasound-guided placement of drainage catheter in lateral right shoulder abscess collection 2. Ultrasound-guided placement of drainage catheter in the anterior right shoulder abscess collection MEDICATIONS: 1% lidocaine for local anesthetic ANESTHESIA/SEDATION: None COMPLICATIONS: None immediate. PROCEDURE: Informed written consent was obtained from the patient's daughter after a thorough discussion of the procedural risks, benefits and alternatives. All questions were addressed. Maximal Sterile Barrier Technique was utilized including caps, mask, sterile gowns, sterile gloves, sterile drape, hand hygiene and skin antiseptic. A timeout was performed prior to the initiation of the procedure. The right shoulder was prepped and draped in sterile fashion. Ultrasound demonstrated  a large fluid collection along the lateral aspect of the right shoulder. The skin was anesthetized with 1% lidocaine. Small incision was made. Using ultrasound guidance, a Yueh catheter was directed into the fluid collection and pink purulent fluid was aspirated. Superstiff Amplatz wire was placed and the tract was dilated to accommodate a 10 [place] multipurpose drain. Approximately 130 mL of pink purulent fluid was removed. This collection was decompressed.  Ultrasound of right shoulder demonstrated persistent fluid collections in the anterior right shoulder region. The largest right anterior shoulder fluid collection was targeted. The skin was anesthetized with 1% lidocaine. Small incision was made. Using ultrasound guidance, a Yueh catheter was directed into this anterior fluid collection and yellow purulent fluid was aspirated. Superstiff Amplatz wire was placed and an 8 [employer] drain was advanced over the wire. Approximately 40 mL of fluid was removed from this collection. Ultrasound was also used to evaluate the remainder of the right shoulder. Both drains were sutured to skin with Prolene suture. Both drains were attached to suction bulbs. Fluid sample from each drain was sent for culture. FINDINGS: There was a very large fluid collection in the lateral aspect of the right shoulder and 130 mL of pink purulent fluid was removed from this collection. This collection was decompressed at the end of the procedure. There were small fluid collections along the anterior aspect of the right shoulder including a dominant collection. An 8 French drain was placed in the dominant anterior right fluid collection and this appeared to decompress the other right anterior fluid collections. 40 mL of yellow purulent fluid was removed from the right anterior shoulder fluid collection. IMPRESSION: 1. Ultrasound-guided placement of a drainage catheter in the right lateral shoulder abscess collection. 2. Ultrasound-guided placement of a drainage catheter in the right anterior shoulder abscess. Electronically Signed   By: Richarda Overlie M.D.   On: 11/04/2022 15:25   IR US Guide Bx Asp/Drain  Result Date: 11/04/2022 INDICATION: 89 year old female with right shoulder periprosthetic fluid collections that are suggestive for abscesses. EXAM: 1. Ultrasound-guided placement of drainage catheter in lateral right shoulder abscess collection 2. Ultrasound-guided placement of  drainage catheter in the anterior right shoulder abscess collection MEDICATIONS: 1% lidocaine for local anesthetic ANESTHESIA/SEDATION: None COMPLICATIONS: None immediate. PROCEDURE: Informed written consent was obtained from the patient's daughter after a thorough discussion of the procedural risks, benefits and alternatives. All questions were addressed. Maximal Sterile Barrier Technique was utilized including caps, mask, sterile gowns, sterile gloves, sterile drape, hand hygiene and skin antiseptic. A timeout was performed prior to the initiation of the procedure. The right shoulder was prepped and draped in sterile fashion. Ultrasound demonstrated a large fluid collection along the lateral aspect of the right shoulder. The skin was anesthetized with 1% lidocaine. Small incision was made. Using ultrasound guidance, a Yueh catheter was directed into the fluid collection and pink purulent fluid was aspirated. Superstiff Amplatz wire was placed and the tract was dilated to accommodate a 10 [place] multipurpose drain. Approximately 130 mL of pink purulent fluid was removed. This collection was decompressed. Ultrasound of right shoulder demonstrated persistent fluid collections in the anterior right shoulder region. The largest right anterior shoulder fluid collection was targeted. The skin was anesthetized with 1% lidocaine. Small incision was made. Using ultrasound guidance, a Yueh catheter was directed into this anterior fluid collection and yellow purulent fluid was aspirated. Superstiff Amplatz wire was placed and an 8 [employer] drain was advanced over the wire. Approximately 40 mL  of fluid was removed from this collection. Ultrasound was also used to evaluate the remainder of the right shoulder. Both drains were sutured to skin with Prolene suture. Both drains were attached to suction bulbs. Fluid sample from each drain was sent for culture. FINDINGS: There was a very large fluid collection in the  lateral aspect of the right shoulder and 130 mL of pink purulent fluid was removed from this collection. This collection was decompressed at the end of the procedure. There were small fluid collections along the anterior aspect of the right shoulder including a dominant collection. An 8 French drain was placed in the dominant anterior right fluid collection and this appeared to decompress the other right anterior fluid collections. 40 mL of yellow purulent fluid was removed from the right anterior shoulder fluid collection. IMPRESSION: 1. Ultrasound-guided placement of a drainage catheter in the right lateral shoulder abscess collection. 2. Ultrasound-guided placement of a drainage catheter in the right anterior shoulder abscess. Electronically Signed   By: Richarda Overlie M.D.   On: 11/04/2022 15:25   VAS [place] LOWER EXTREMITY VENOUS (DVT) (7a-7p)  Result Date: 11/04/2022  Lower Venous DVT Study Patient Name:  ALYSON KI  Date of Exam:   11/03/2022 Medical Rec #: [id number]        Accession #:    [id number] Date of Birth: July 09, 1921        Patient Gender: F Patient Age:   14 years Exam Location:  [facility] Procedure:      VAS [place] LOWER EXTREMITY VENOUS (DVT) Referring Phys: ADAM CURATOLO --------------------------------------------------------------------------------  Indications: Pain, Edema, and Chronic swelling.  Risk Factors: Surgery Lower ext bypass graft, left leg stenting. Comparison Study: 03/16/21 - Negative LE venous Performing Technologist: Herndon Sink Sturdivant RDMS, RVT  Examination Guidelines: A complete evaluation includes B-mode imaging, spectral Doppler, color Doppler, and power Doppler as needed of all accessible portions of each vessel. Bilateral testing is considered an integral part of a complete examination. Limited examinations for reoccurring indications may be performed as noted. The reflux portion of the exam is performed with the patient in reverse Trendelenburg.   +---------+---------------+---------+-----------+----------+-------------------+ RIGHT    CompressibilityPhasicitySpontaneityPropertiesThrombus Aging      +---------+---------------+---------+-----------+----------+-------------------+ CFV      Full           Yes      Yes                                      +---------+---------------+---------+-----------+----------+-------------------+ SFJ      Full                                                             +---------+---------------+---------+-----------+----------+-------------------+ FV Prox  Full                                                             +---------+---------------+---------+-----------+----------+-------------------+ FV Mid   Full                                                             +---------+---------------+---------+-----------+----------+-------------------+  FV DistalFull                                                             +---------+---------------+---------+-----------+----------+-------------------+ PFV      Full                                                             +---------+---------------+---------+-----------+----------+-------------------+ POP      Full           Yes      Yes                                      +---------+---------------+---------+-----------+----------+-------------------+ PTV      Full                                                             +---------+---------------+---------+-----------+----------+-------------------+ PERO                                                  Not well visualized +---------+---------------+---------+-----------+----------+-------------------+   Right Technical Findings: Bilateral calf veins were not well visualized. Cannot exclude chronic DVT in the calf veins.  +---------+---------------+---------+-----------+----------+-------------------+ LEFT      CompressibilityPhasicitySpontaneityPropertiesThrombus Aging      +---------+---------------+---------+-----------+----------+-------------------+ CFV      Full           Yes      Yes                                      +---------+---------------+---------+-----------+----------+-------------------+ SFJ      Full                                                             +---------+---------------+---------+-----------+----------+-------------------+ FV Prox  Full                                                             +---------+---------------+---------+-----------+----------+-------------------+ FV Mid   Full                                                             +---------+---------------+---------+-----------+----------+-------------------+  FV DistalFull                                                             +---------+---------------+---------+-----------+----------+-------------------+ PFV      Full                                                             +---------+---------------+---------+-----------+----------+-------------------+ POP      Full           Yes      Yes                                      +---------+---------------+---------+-----------+----------+-------------------+ PTV      Full                                                             +---------+---------------+---------+-----------+----------+-------------------+ PERO                                                  Not well visualized +---------+---------------+---------+-----------+----------+-------------------+     Summary: BILATERAL: - No evidence of acute deep vein thrombosis seen in the lower extremities, bilaterally. -No evidence of popliteal cyst, bilaterally. RIGHT: -   *See table(s) above for measurements and observations. Electronically signed by Gerarda Fraction on 11/04/2022 at 10:55:07 AM.    Final    CT SHOULDER RIGHT W  CONTRAST  Result Date: 11/03/2022 CLINICAL DATA:  Septic arthritis suspected, shoulder, xray done. Leukocytosis and elevated serum inflammatory markers. EXAM: CT OF THE UPPER RIGHT EXTREMITY WITH CONTRAST TECHNIQUE: Multidetector CT imaging of the upper right extremity was performed according to the standard protocol following intravenous contrast administration. RADIATION DOSE REDUCTION: This exam was performed according to the departmental dose-optimization program which includes automated exposure control, adjustment of the mA and/or kV according to patient size and/or use of iterative reconstruction technique. CONTRAST:  75mL OMNIPAQUE IOHEXOL 350 MG/ML SOLN COMPARISON:  X-ray 11/03/2022, CT 05/27/2021 FINDINGS: Bones/Joint/Cartilage Long stem right humeral prosthesis. The inferior tip of the prosthesis is not included within the field of view. Chronic superomedial migration of the humeral head component with extensive osseous remodeling of the scapula and distal clavicle. Chronic osteolysis involving the proximal humeral diaphysis, not significantly progressed. No acute fracture. Os acromiale. Ligaments Suboptimally assessed by CT. Muscles and Tendons Findings of chronic rotator cuff tears with chronic muscle atrophy. Soft tissues Large peripherally enhancing periprosthetic fluid collection surrounding the proximal right humerus measuring approximately 14 x 5 cm in transaxial dimension. Loculated component at the anterior shoulder extends approximately 10 cm in craniocaudal dimension. Collection closely approximates the skin surface at the anterior aspect of the shoulder. Loculated component at the posterior aspect of the shoulder  measures approximately 8 cm in craniocaudal dimension. Mildly prominent right axillary lymph nodes are likely reactive. Small layering right-sided pleural effusion. Multinodular goiter, similar to the previous study. In the setting of significant comorbidities or limited life  expectancy, no follow-up recommended (ref: J Am Coll Radiol. 2015 Feb;12(2): 143-50). IMPRESSION: 1. Large peripherally-enhancing periprosthetic fluid collection surrounding the proximal right humerus measuring up to 14 cm in maximal dimension. Findings are most concerning for infected fluid collection/abscess given the patient's history. Aspiration and joint fluid analysis is recommended. 2. Chronic superomedial migration of the humeral head component with extensive osseous remodeling of the scapula and distal clavicle. Chronic osteolysis involving the proximal humeral diaphysis, not significantly progressed. 3. Small layering right-sided pleural effusion. Electronically Signed   By: Duanne Guess D.O.   On: 11/03/2022 15:09   DG Chest 2 View  Result Date: 11/03/2022 CLINICAL DATA:  Chest pain. EXAM: CHEST - 2 VIEW COMPARISON:  October 25, 2022. FINDINGS: Stable cardiomegaly. Large hiatal hernia is noted. Minimal right basilar subsegmental atelectasis is noted. Status post bilateral shoulder arthroplasties. IMPRESSION: Minimal right basilar subsegmental atelectasis. Large hiatal hernia. Electronically Signed   By: Lupita Raider M.D.   On: 11/03/2022 12:06   DG Shoulder Right  Result Date: 11/03/2022 CLINICAL DATA:  Pain EXAM: RIGHT SHOULDER - 3 VIEW COMPARISON:  None Available. FINDINGS: There is a right shoulder prosthesis. There appears to be grossly anatomic alignment. There is absence of the acromial humeral distance indicating the presence of rotator cuff defect. No acute fracture, dislocation or subluxation identified. IMPRESSION: Status post right shoulder arthroplasty. No acute osseous abnormalities are seen. Electronically Signed   By: Layla Maw M.D.   On: 11/03/2022 12:03   DG Chest 2 View  Result Date: 10/25/2022 CLINICAL DATA:  Shortness of breath. EXAM: CHEST - 2 VIEW COMPARISON:  09/28/2022. FINDINGS: The heart is enlarged and the mediastinal contour is within normal limits. A  large hiatal hernia is noted. There is atherosclerotic calcification of the aorta. Lung volumes are low with atelectasis at the lung bases. No effusion or pneumothorax. Shoulder arthroplasty changes are present bilaterally. Degenerative changes are noted on the thoracic spine. Surgical clips are present in the right upper quadrant. IMPRESSION: 1. Low lung volumes with mild atelectasis at the lung bases. 2. Cardiomegaly. 3. Large hiatal hernia. Electronically Signed   By: Thornell Sartorius M.D.   On: 10/25/2022 22:54    Microbiology: Results for orders placed or performed during the hospital encounter of 11/03/22  Blood culture (routine x 2)     Status: None   Collection Time: 11/03/22 11:36 AM   Specimen: BLOOD  Result Value Ref Range Status   Specimen Description BLOOD LEFT ANTECUBITAL  Final   Special Requests   Final    BOTTLES DRAWN AEROBIC AND ANAEROBIC Blood Culture adequate volume   Culture   Final    NO GROWTH 5 DAYS Performed at [facility], [address] [place], [place] [address]    Report Status 11/08/2022 FINAL  Final  MRSA Next Gen by PCR, Nasal     Status: None   Collection Time: 11/03/22  6:08 PM   Specimen: Nasal Mucosa; Nasal Swab  Result Value Ref Range Status   MRSA by PCR Next Gen NOT DETECTED NOT DETECTED Final    Comment: (NOTE) The GeneXpert MRSA Assay (FDA approved for NASAL specimens only), is one component of a comprehensive MRSA colonization surveillance program. It is not intended to diagnose MRSA infection nor  to guide or monitor treatment for MRSA infections. Test performance is not FDA approved in patients less than 21 years old. Performed at [facility], [address], [place], [place] [address]   Culture, blood (Routine X 2) w Reflex to ID Panel     Status: None   Collection Time: 11/04/22  9:47 AM   Specimen: BLOOD  Result Value Ref Range Status   Specimen Description BLOOD SITE NOT SPECIFIED  Final   Special Requests   Final     BOTTLES DRAWN AEROBIC AND ANAEROBIC Blood Culture results may not be optimal due to an excessive volume of blood received in culture bottles   Culture   Final    NO GROWTH 5 DAYS Performed at [facility], [address] [place], [place] [address]    Report Status 11/09/2022 FINAL  Final  Aerobic/Anaerobic Culture w Gram Stain (surgical/deep wound)     Status: None   Collection Time: 11/04/22 12:34 PM   Specimen: Abscess  Result Value Ref Range Status   Specimen Description ABSCESS  Final   Special Requests DRAIN 1 RIGHT LATERAL SHOULDER  Final   Gram Stain   Final    ABUNDANT WBC PRESENT, PREDOMINANTLY PMN NO ORGANISMS SEEN    Culture   Final    FEW ESCHERICHIA COLI SUSCEPTIBILITIES PERFORMED ON PREVIOUS CULTURE WITHIN THE LAST 5 DAYS. NO ANAEROBES ISOLATED Performed at [facility], [address] [place], [place] [address]    Report Status 11/17/2022 FINAL  Final  Aerobic/Anaerobic Culture w Gram Stain (surgical/deep wound)     Status: None   Collection Time: 11/04/22 12:43 PM   Specimen: Abscess  Result Value Ref Range Status   Specimen Description ABSCESS  Final   Special Requests DRAIN 2 RIGHT ANTERIOR SHOULDER  Final   Gram Stain   Final    ABUNDANT WBC PRESENT, PREDOMINANTLY PMN NO ORGANISMS SEEN    Culture   Final    FEW ESCHERICHIA COLI NO ANAEROBES ISOLATED Performed at [facility], [address] [place], [place] [address]    Report Status 11/17/2022 FINAL  Final   Organism ID, Bacteria ESCHERICHIA COLI  Final      Susceptibility   Escherichia coli - MIC*    AMPICILLIN >=32 RESISTANT Resistant     CEFEPIME <=0.12 SENSITIVE Sensitive     CEFTAZIDIME <=1 SENSITIVE Sensitive     CEFTRIAXONE <=0.25 SENSITIVE Sensitive     CIPROFLOXACIN 1 RESISTANT Resistant     GENTAMICIN <=1 SENSITIVE Sensitive     IMIPENEM <=0.25 SENSITIVE Sensitive     TRIMETH/SULFA >=320 RESISTANT Resistant     AMPICILLIN/SULBACTAM >=32 RESISTANT Resistant      PIP/TAZO <=4 SENSITIVE Sensitive     * FEW ESCHERICHIA COLI    Labs: CBC: Recent Labs  Lab 11/13/22 0351 11/15/22 0355 11/16/22 0357 11/17/22 0930  WBC 13.6* 10.7* 9.3 7.3  NEUTROABS  --  8.7*  --   --   HGB 8.7* 8.8* 8.9* 8.8*  HCT 28.5* 27.7* 29.5* 29.3*  MCV 87.7 86.6 88.9 90.7  PLT 344 343 364 373    Basic Metabolic Panel: Recent Labs  Lab 11/11/22 0617 11/12/22 0354 11/13/22 0351 11/14/22 0313 11/15/22 0355 11/16/22 0357 11/17/22 0930  NA 126* 127* 129* 128* 128* 132* 134*  K 4.6 4.7 5.1 4.4 4.4 4.3 4.5  CL 86* 89* 87* 86* 86* 86* 87*  CO2 29 29 31  32 34* 35* 38*  GLUCOSE 175*  133* 119* 167* 158* 113* 187*  BUN 22 25* 25* 23 23 20 16   CREATININE 0.88 0.85 0.76 0.77 0.80 0.68 0.55  CALCIUM 8.9 8.9 9.3 9.2 9.2 9.4 9.3  PHOS 4.3 3.7  --  2.8 2.8  --   --     Liver Function Tests: Recent Labs  Lab 11/11/22 0617 11/12/22 0354 11/14/22 0313 11/15/22 0355 11/16/22 0357  AST  --   --   --   --  19  ALT  --   --   --   --  21  ALKPHOS  --   --   --   --  62  BILITOT  --   --   --   --  0.5  PROT  --   --   --   --  6.1*  ALBUMIN 1.9* 2.0* 2.3* 2.2* 2.1*    CBG: Recent Labs  Lab 11/16/22 1128 11/16/22 1658 11/16/22 2151 11/17/22 0810 11/17/22 1136  GLUCAP 179* 293* 239* 181* 221*     Discharge time spent: approximately 45 minutes spent on discharge counseling, evaluation of patient on day of discharge, and coordination of discharge planning with nursing, social work, pharmacy and case management  Signed: Alberteen Sam, MD [facility] 11/17/2022

## 2022-11-17 NOTE — ED Triage Notes (Signed)
Pt BIB EMS from [facility] facility. Per EMS, pt was discharged from being admitted here after a blood clot was removed from her arm. Pt started complaining of chest pain 1 hour after going back to nursing facility. Pt's O2 sat dropped to the mid 80s per nurses from facility. Pt denies chest pain or SOB at this time. Pt on 1L [place] @ baseline. A/Ox4.

## 2022-11-17 NOTE — ED Notes (Signed)
Pt report received from previous nurse. Pt A&O x3, vitals stable, denies needs/complaints. Call bell in reach. No acute distress noted. Family @ bedside.

## 2022-11-17 NOTE — TOC Progression Note (Signed)
Transition of Care [facility]) - Progression Note    Patient Details  Name: Heidi Dominguez MRN: [id number] Date of Birth: [birth year]  [facility] CM/SW Contact  Tory Emerald, [place] Phone Number: 11/17/2022, 9:12 AM  Clinical Narrative:     CSW messaged [facility]- Johnell Comings to f/u on auth. Yuko Coventry reports [facility] remains pending for SNF.   Expected Discharge Plan: Skilled Nursing Facility Barriers to Discharge: Continued Medical Work up  Expected Discharge Plan and Services In-house Referral: Clinical Social Work   Post Acute Care Choice: Skilled Nursing Facility Living arrangements for the past 2 months: Skilled Nursing Facility [place]) Expected Discharge Date: 11/15/22                                     Social Determinants of Health (SDOH) Interventions SDOH Screenings   Food Insecurity: No Food Insecurity (11/03/2022)  Housing: Low Risk  (11/03/2022)  Transportation Needs: No Transportation Needs (11/03/2022)  Utilities: Not At Risk (11/03/2022)  Depression (PHQ2-9): Low Risk  (02/09/2021)  Tobacco Use: Low Risk  (11/04/2022)    Readmission Risk Interventions     No data to display

## 2022-11-17 NOTE — Progress Notes (Signed)
   11/17/22 2300  Spiritual Encounters  Type of Visit Initial  Care provided to: Pt and family  Referral source Family  Reason for visit Routine spiritual support  OnCall Visit Yes   Chaplain met PT's granddaughter in the ED waiting area and she requested that Chaplain gp pray with her Grandmother.  Mirna Mires spoke with PT for a few minutes and prayed.

## 2022-11-17 NOTE — ED Provider Notes (Signed)
[place] EMERGENCY DEPARTMENT AT [facility] Provider Note   CSN: [id number] Arrival date & time: 11/17/22  1853     History  Chief Complaint  Patient presents with   Chest Pain    Heidi Dominguez is a 89 y.o. female.  Pt presents via EMS from SNF after just being discharged there today. Per report, pt was c/o chest discomfort. Patient currently denies chest pain and has no recollection of complaining of chest pain earlier. Pt indicates she did have episode of vomiting earlier (pt with hx chronic dysphagia, suspected esophageal stricture but not candidate for egd, and reflux).  Pt currently denies pain or specific complaint. No headache. No chest pain. No sob. No abd pain. No gu c/o. No extremity pain.   The history is provided by the patient, medical records, the EMS personnel and a relative. The history is limited by the condition of the patient.  Chest Pain Associated symptoms: vomiting   Associated symptoms: no abdominal pain, no back pain, no cough, no fever, no headache, no palpitations and no shortness of breath        Home Medications Prior to Admission medications   Medication Sig Start Date End Date Taking? Authorizing Provider  acetaminophen (TYLENOL) 325 MG tablet Take 650 mg by mouth 3 (three) times daily.    [provider]  albuterol (VENTOLIN HFA) 108 (90 Base) MCG/ACT inhaler Inhale 2 puffs into the lungs every 6 (six) hours as needed for wheezing or shortness of breath.    [provider]  amLODipine (NORVASC) 10 MG tablet Take 10 mg by mouth daily. HOLD if SBP < 110.    [provider]  aspirin EC 81 MG tablet Take 162 mg by mouth at bedtime.    [provider]  budesonide (PULMICORT) 0.5 MG/2ML nebulizer solution Take 2 mLs (0.5 mg total) by nebulization in the morning and at bedtime. 10/16/22   Cobb, Ruby Cola, NP  cefadroxil (DURICEF) 500 MG capsule Take 2 capsules (1,000 mg total) by mouth 2 (two) times daily.  11/15/22 12/15/22  DanfordEarl Lites, MD  Cholecalciferol (VITAMIN D3) 25 MCG (1000 UT) capsule Take 1,000 Units by mouth every Monday.    [provider]  Cinnamon 500 MG capsule Take 500 mg by mouth daily.    [provider]  dextromethorphan-guaiFENesin (MUCINEX DM) 30-600 MG 12hr tablet Take 1 tablet by mouth 2 (two) times daily. 10/16/22   Cobb, Ruby Cola, NP  diclofenac Sodium (VOLTAREN ARTHRITIS PAIN) 1 % GEL Apply 2 g topically 3 (three) times daily. To both shoulders.    [provider]  furosemide (LASIX) 20 MG tablet Take 1 tablet (20 mg total) by mouth daily. 11/17/22   Danford, Earl Lites, MD  hypromellose (GENTEAL SEVERE) 0.3 % GEL ophthalmic ointment Place 1 Application into both eyes 2 (two) times daily.    [provider]  insulin lispro (HUMALOG) 100 UNIT/ML injection Inject 0-12 Units into the skin 3 (three) times daily before meals. Per sliding scale: If blood sugar is less than 70, call NP/PA. If blood sugar is 70 to 200, give 0 units. If blood sugar is 201 to 250, give 2 units. If blood sugar is 251 to 300, give 4 units. If blood sugar is 301 to 350, give 6 units. If blood sugar is 351 to 400, give 8 units. If blood sugar is 401 to 450, give 10 units. If blood sugar is 451 to 600, give 12 units. Recheck in  2 hours.    [provider]  ipratropium-albuterol (DUONEB) 0.5-2.5 (3) MG/3ML SOLN Take 3 mLs by nebulization 2 (two) times daily. 09/19/22   [provider]  Lactobacillus (PROBIOTIC ACIDOPHILUS) CAPS Take 1 capsule by mouth at bedtime.    [provider]  LEVEMIR FLEXPEN 100 UNIT/ML FlexPen Inject 5 Units into the skin daily. 11/02/22   [provider]  loratadine (CLARITIN) 10 MG tablet Take 10 mg by mouth at bedtime.    [provider]  metFORMIN (GLUCOPHAGE) 500 MG tablet Take 0.5 tablets (250 mg total) by mouth 2 (two) times daily with a meal. Patient taking differently: Take 500 mg by  mouth 2 (two) times daily with a meal. 10/26/22 11/25/22  Jeannie Fend, PA-C  Multiple Vitamins-Minerals (PRESERVISION AREDS) CAPS Take 1 capsule by mouth daily.    [provider]  OMEPRAZOLE PO Take 20 mg by mouth daily. Omeprazole 20 mg DR disintegrating tablet.    [provider]  ondansetron (ZOFRAN) 4 MG tablet Take 4 mg by mouth every 8 (eight) hours as needed for nausea or vomiting.    [provider]  Polyethyl Glycol-Propyl Glycol (LUBRICANT EYE DROPS) 0.4-0.3 % SOLN Place 2 drops into both eyes daily.    [provider]  polyethylene glycol powder (GLYCOLAX/MIRALAX) 17 GM/SCOOP powder Take 17 g by mouth at bedtime.    [provider]  polyvinyl alcohol (LIQUIFILM TEARS) 1.4 % ophthalmic solution Place 1 drop into both eyes in the morning, at noon, in the evening, and at bedtime. Following injection treatment    [provider]      Allergies    Augmentin [amoxicillin-pot clavulanate], Bactrim [sulfamethoxazole-trimethoprim], Calan [verapamil], Catapres [clonidine hcl], Codeine, Hydrocodone, Other, Sulfa antibiotics, Zestril [lisinopril], and Azulfidine [sulfasalazine]    Review of Systems   Review of Systems  Constitutional:  Negative for fever.  HENT:  Negative for sore throat.   Respiratory:  Negative for cough and shortness of breath.   Cardiovascular:  Positive for chest pain. Negative for palpitations.  Gastrointestinal:  Positive for vomiting. Negative for abdominal pain and constipation.  Genitourinary:  Negative for dysuria and flank pain.  Musculoskeletal:  Negative for back pain and neck pain.  Skin:  Negative for rash.  Neurological:  Negative for headaches.  Hematological:  Does not bruise/bleed easily.    Physical Exam Updated Vital Signs BP (!) 143/61   Pulse 90   Temp 97.9 F (36.6 C) (Oral)   Resp (!) 31   SpO2 98%  Physical Exam Vitals and nursing note reviewed.  Constitutional:      Appearance:  Normal appearance. She is well-developed.  HENT:     Head: Atraumatic.     Nose: Nose normal.     Mouth/Throat:     Mouth: Mucous membranes are moist.  Eyes:     General: No scleral icterus.    Conjunctiva/sclera: Conjunctivae normal.     Pupils: Pupils are equal, round, and reactive to light.  Neck:     Trachea: No tracheal deviation.  Cardiovascular:     Rate and Rhythm: Normal rate and regular rhythm.     Pulses: Normal pulses.     Heart sounds: Normal heart sounds. No murmur heard.    No friction rub. No gallop.  Pulmonary:     Effort: Pulmonary effort is normal. No respiratory distress.     Breath sounds: Normal breath sounds.  Abdominal:     General: Bowel sounds are normal. There is no  distension.     Palpations: Abdomen is soft. There is no mass.     Tenderness: There is no abdominal tenderness. There is no guarding or rebound.     Hernia: No hernia is present.  Genitourinary:    Comments: No cva tenderness.  Musculoskeletal:        General: No swelling or tenderness.     Cervical back: Normal range of motion and neck supple. No rigidity. No muscular tenderness.  Skin:    General: Skin is warm and dry.     Findings: No rash.  Neurological:     Mental Status: She is alert.     Comments: Alert, speech normal. Motor/sens grossly intact bil.   Psychiatric:        Mood and Affect: Mood normal.     ED Results / Procedures / Treatments   Labs (all labs ordered are listed, but only abnormal results are displayed) Results for orders placed or performed during the hospital encounter of 11/17/22  CBC  Result Value Ref Range   WBC 10.1 4.0 - 10.5 K/uL   RBC 3.34 (L) 3.87 - 5.11 MIL/uL   Hemoglobin 9.0 (L) 12.0 - 15.0 g/dL   HCT 82.9 (L) 56.2 - 13.0 %   MCV 89.8 80.0 - 100.0 fL   MCH 26.9 26.0 - 34.0 pg   MCHC 30.0 30.0 - 36.0 g/dL   RDW 86.5 (H) 78.4 - 69.6 %   Platelets 382 150 - 400 K/uL   nRBC 0.0 0.0 - 0.2 %  Comprehensive metabolic panel  Result Value Ref  Range   Sodium 134 (L) 135 - 145 mmol/L   Potassium 4.7 3.5 - 5.1 mmol/L   Chloride 87 (L) 98 - 111 mmol/L   CO2 37 (H) 22 - 32 mmol/L   Glucose, Bld 165 (H) 70 - 99 mg/dL   BUN 15 8 - 23 mg/dL   Creatinine, Ser 2.95 0.44 - 1.00 mg/dL   Calcium 9.5 8.9 - 28.4 mg/dL   Total Protein 6.5 6.5 - 8.1 g/dL   Albumin 2.2 (L) 3.5 - 5.0 g/dL   AST 17 15 - 41 U/L   ALT 16 0 - 44 U/L   Alkaline Phosphatase 63 38 - 126 U/L   Total Bilirubin 0.5 0.3 - 1.2 mg/dL   GFR, Estimated >13 >24 mL/min   Anion gap 10 5 - 15  Lipase, blood  Result Value Ref Range   Lipase 20 11 - 51 U/L  Troponin I (High Sensitivity)  Result Value Ref Range   Troponin I (High Sensitivity) 17 <18 ng/L   DG Chest Port 1 View  Result Date: 11/17/2022 CLINICAL DATA:  Chest pain. EXAM: PORTABLE CHEST 1 VIEW COMPARISON:  November 16, 2022 FINDINGS: There is stable mild to moderate severity cardiac silhouette enlargement. Marked severity calcification and tortuosity of the thoracic aorta is seen. Low lung volumes are noted with a stable appearing area of left basilar opacification. There is mild prominence of the perihilar pulmonary vasculature. No pneumothorax is identified. Bilateral shoulder replacements are noted. IMPRESSION: 1. Stable mild to moderate severity cardiac silhouette enlargement. 2. Low lung volumes with stable findings consistent with a known large hiatal hernia and adjacent left basilar atelectasis. Electronically Signed   By: Aram Candela M.D.   On: 11/17/2022 23:29   DG CHEST PORT 1 VIEW  Result Date: 11/16/2022 CLINICAL DATA:  Hypoxia. EXAM: PORTABLE CHEST 1 VIEW COMPARISON:  Radiograph 11/13/2022, chest CT 03/16/2021 FINDINGS: Retrocardiac opacity is likely related  to large hiatal hernia. There may be slight increased density in the lateral left lung base compared to prior exam. Stable heart size and mediastinal contours with prominent aortic atherosclerosis. Increasing right upper lobe airspace disease abutting  the fissure in the right mid lung. Similar peribronchial thickening which may be pulmonary edema. Suspected small right pleural effusion. Bilateral humeral head arthroplasties, abutting the undersurface of the clavicle on the right, chronic. IMPRESSION: 1. Increasing right upper lobe airspace disease suspicious for pneumonia. 2. Peribronchial thickening may be pulmonary edema, similar. 3. Probable small right pleural effusion. 4. Left retrocardiac opacity is likely due to large hiatal hernia. There is slight increasing density in the left lateral lung base which may be due to effusion or adjacent atelectasis/airspace disease. Electronically Signed   By: Narda Rutherford M.D.   On: 11/16/2022 17:36   DG ESOPHAGUS W SINGLE CM (SOL OR THIN BA)  Result Date: 11/13/2022 CLINICAL DATA:  Provided history: Vomiting. Swallowing air. Additional history provided: History of esophageal dysphagia/dysfunction. EXAM: ESOPHAGUS/BARIUM SWALLOW/TABLET STUDY TECHNIQUE: A single contrast examination was performed using thin liquid barium. The exam was performed by Alex Gardener, NP, and was supervised and interpreted by Dr. Jackey Loge. FLUOROSCOPY: Fluoroscopy time: 1 minute, 12 seconds (17.8 mGy). COMPARISON:  Modified barium swallow 07/20/2022. Upper GI series 06/20/2017. Esophagram 03/27/2008. FINDINGS: Limited study due to the patient's inability to stand and limited ability to reposition on the fluoroscopy table. Additionally, a 13 mm barium tablet was not administered (nursing staff reports the patient is unable to swallow pills unless crushed). Mildly patulous esophagus. Focal narrowing of the distal esophagus compatible with stricture, similar to the prior upper GI series of 06/20/2017. It is estimated at the esophagus is narrowed to 5-7 mm at this site. Prominent intermittent esophageal dysmotility with tertiary contractions. Redemonstrated large hiatal hernia. No gastroesophageal reflux was observed. IMPRESSION: 1. Limited  examination due to the patient's inability to stand and limited ability to reposition on the fluoroscopy table. Additionally, a 13 mm barium tablet was not administered (nursing staff reports the patient is unable to swallow pills unless crushed). 2. Focal narrowing of the distal esophagus compatible with stricture, similar to the prior upper GI series 06/20/2017. It is estimated that the esophagus is narrowed to 5-7 mm at this site. 3. Mildly patulous esophagus elsewhere. 4. Prominent esophageal dysmotility with tertiary contractions. 5. Redemonstrated large hiatal hernia. Electronically Signed   By: Jackey Loge D.O.   On: 11/13/2022 12:15   DG CHEST PORT 1 VIEW  Result Date: 11/13/2022 CLINICAL DATA:  Acute respiratory failure with hypoxia. EXAM: PORTABLE CHEST 1 VIEW COMPARISON:  11/11/2022. FINDINGS: Low lung volumes accentuate the pulmonary vasculature and cardiomediastinal silhouette. Unchanged mild interstitial pulmonary edema and small bilateral pleural effusions. Unchanged retrocardiac opacity, likely atelectasis. Stable cardiac and mediastinal contours. No pneumothorax. IMPRESSION: Unchanged mild interstitial pulmonary edema and small bilateral pleural effusions. Electronically Signed   By: Orvan Falconer M.D.   On: 11/13/2022 10:55   DG Chest 1 View  Result Date: 11/11/2022 CLINICAL DATA:  Shortness of breath EXAM: PORTABLE CHEST 1 VIEW COMPARISON:  11/03/22 FINDINGS: Cardiac shadow is enlarged but stable. Aortic calcifications are again seen. Lungs are well aerated bilaterally. Slight increase in central vascular congestion is noted with edema. Large hiatal hernia is noted. IMPRESSION: Slight increase in central vascular congestion with mild edema. Large hiatal hernia. Electronically Signed   By: Alcide Clever M.D.   On: 11/11/2022 01:16   ECHOCARDIOGRAM COMPLETE  Result Date: 11/09/2022  ECHOCARDIOGRAM REPORT   Patient Name:   TYEASHA EBBS Date of Exam: 11/09/2022 Medical Rec #:   [id number]       Height:       64.0 in Accession #:    [id number]      Weight:       190.7 lb Date of Birth:  January 26, 1921       BSA:          1.917 m Patient Age:    89 years        BP:           135/74 mmHg Patient Gender: F               HR:           90 bpm. Exam Location:  Inpatient Procedure: 2D Echo, 3D Echo, Cardiac Doppler, Color Doppler and Intracardiac            Opacification Agent Indications:     CHF/Aortic Stenosis  History:         Patient has prior history of Echocardiogram examinations, most                  recent 04/09/2015. CHF, Signs/Symptoms:Murmur and Dyspnea; Risk                  Factors:Diabetes and Hypertension. CKD.  Sonographer:     Milda Smart Referring Phys:  [id number] Diagnosing Phys: Dina Rich MD  Sonographer Comments: Image acquisition challenging due to patient body habitus and Image acquisition challenging due to respiratory motion. IMPRESSIONS  1. Left ventricular ejection fraction, by estimation, is 65 to 70%. The left ventricle has normal function. The left ventricle has no regional wall motion abnormalities. There is mild left ventricular hypertrophy. Left ventricular diastolic parameters are consistent with Grade I diastolic dysfunction (impaired relaxation). Elevated left atrial pressure.  2. Right ventricular systolic function is normal. The right ventricular size is normal. There is mildly elevated pulmonary artery systolic pressure.  3. Left atrial size was severely dilated.  4. A small pericardial effusion is present. The pericardial effusion is circumferential. There is no evidence of cardiac tamponade.  5. The mitral valve is abnormal. Trivial mitral valve regurgitation. Moderate mitral stenosis. The mean mitral valve gradient is 6.0 mmHg.HR 87 bpm  6. The tricuspid valve is abnormal.  7. The aortic valve is tricuspid. There is moderate calcification of the aortic valve. There is moderate thickening of the aortic valve. Aortic valve regurgitation is not  visualized. Severe aortic valve stenosis. Aortic valve mean gradient measures 64.0  mmHg. Aortic valve peak gradient measures 91.6 mmHg. Aortic valve area, by VTI measures 0.94 cm.  8. The pulmonic valve was abnormal.  9. The inferior vena cava is dilated in size with >50% respiratory variability, suggesting right atrial pressure of 8 mmHg. FINDINGS  Left Ventricle: Left ventricular ejection fraction, by estimation, is 65 to 70%. The left ventricle has normal function. The left ventricle has no regional wall motion abnormalities. Definity contrast agent was given IV to delineate the left ventricular  endocardial borders. The left ventricular internal cavity size was normal in size. There is mild left ventricular hypertrophy. Left ventricular diastolic parameters are consistent with Grade I diastolic dysfunction (impaired relaxation). Elevated left atrial pressure. Right Ventricle: The right ventricular size is normal. Right vetricular wall thickness was not well visualized. Right ventricular systolic function is normal. There is mildly elevated pulmonary artery systolic pressure. The tricuspid regurgitant velocity  is 3.00 m/s, and with an assumed right atrial pressure of 8 mmHg, the estimated right ventricular systolic pressure is 44.0 mmHg. Left Atrium: Left atrial size was severely dilated. Right Atrium: Right atrial size was normal in size. Pericardium: A small pericardial effusion is present. The pericardial effusion is circumferential. There is no evidence of cardiac tamponade. Mitral Valve: The mitral valve is abnormal. Trivial mitral valve regurgitation. Moderate mitral valve stenosis. MV peak gradient, 16.9 mmHg. The mean mitral valve gradient is 6.0 mmHg. Tricuspid Valve: The tricuspid valve is abnormal. Tricuspid valve regurgitation is mild . No evidence of tricuspid stenosis. Aortic Valve: The aortic valve is tricuspid. There is moderate calcification of the aortic valve. There is moderate thickening of  the aortic valve. There is moderate aortic valve annular calcification. Aortic valve regurgitation is not visualized. Severe  aortic stenosis is present. Aortic valve mean gradient measures 64.0 mmHg. Aortic valve peak gradient measures 91.6 mmHg. Aortic valve area, by VTI measures 0.94 cm. Pulmonic Valve: The pulmonic valve was abnormal. Pulmonic valve regurgitation is mild. No evidence of pulmonic stenosis. Aorta: The aortic root and ascending aorta are structurally normal, with no evidence of dilitation. Venous: The inferior vena cava is dilated in size with greater than 50% respiratory variability, suggesting right atrial pressure of 8 mmHg. IAS/Shunts: No atrial level shunt detected by color flow Doppler.  LEFT VENTRICLE PLAX 2D LVIDd:         3.60 cm     Diastology LVIDs:         2.60 cm     LV e' medial:    4.57 cm/s LV PW:         1.10 cm     LV E/e' medial:  27.1 LV IVS:        1.10 cm     LV e' lateral:   4.46 cm/s LVOT diam:     2.00 cm     LV E/e' lateral: 27.8 LV SV:         92 LV SV Index:   48 LVOT Area:     3.14 cm  LV Volumes (MOD) LV vol d, MOD A2C: 72.9 ml LV vol d, MOD A4C: 82.1 ml LV vol s, MOD A2C: 18.7 ml LV vol s, MOD A4C: 17.5 ml LV SV MOD A2C:     54.2 ml LV SV MOD A4C:     82.1 ml LV SV MOD BP:      59.9 ml RIGHT VENTRICLE             IVC RV S prime:     13.90 cm/s  IVC diam: 2.30 cm TAPSE (M-mode): 1.5 cm LEFT ATRIUM              Index        RIGHT ATRIUM           Index LA diam:        4.70 cm  2.45 cm/m   RA Area:     20.90 cm LA Vol (A2C):   135.0 ml 70.42 ml/m  RA Volume:   64.70 ml  33.75 ml/m LA Vol (A4C):   103.0 ml 53.72 ml/m LA Biplane Vol: 121.0 ml 63.11 ml/m  AORTIC VALVE                     PULMONIC VALVE AV Area (Vmax):    0.91 cm      PR End Diast Vel: 5.76 msec AV Area (Vmean):  0.92 cm AV Area (VTI):     0.94 cm AV Vmax:           478.50 cm/s AV Vmean:          363.500 cm/s AV VTI:            0.987 m AV Peak Grad:      91.6 mmHg AV Mean Grad:      64.0 mmHg  LVOT Vmax:         139.00 cm/s LVOT Vmean:        107.000 cm/s LVOT VTI:          0.294 m LVOT/AV VTI ratio: 0.30  AORTA Ao Root diam: 2.90 cm Ao Asc diam:  3.40 cm MITRAL VALVE                TRICUSPID VALVE MV Area VTI:  1.87 cm      TR Peak grad:   36.0 mmHg MV Peak grad: 16.9 mmHg     TR Mean grad:   27.0 mmHg MV Mean grad: 6.0 mmHg      TR Vmax:        300.00 cm/s MV Vmax:      2.05 m/s      TR Vmean:       255.0 cm/s MV Vmean:     114.5 cm/s MR Peak grad: 65.9 mmHg     SHUNTS MR Vmax:      406.00 cm/s   Systemic VTI:  0.29 m MV E velocity: 124.00 cm/s  Systemic Diam: 2.00 cm Dina Rich MD Electronically signed by Dina Rich MD Signature Date/Time: 11/09/2022/3:03:42 PM    Final (Updated)    DG Abd 1 View  Result Date: 11/05/2022 CLINICAL DATA:  Constipation for several days EXAM: ABDOMEN - 1 VIEW COMPARISON:  09/16/2018 FINDINGS: Scattered large and small bowel gas is noted. Retained fecal material is noted throughout the colon consistent with a moderate degree of constipation. No obstructive changes are seen. No free air is noted. Changes of prior vertebral augmentation are seen and stable. Degenerative changes of lumbar spine are noted. IMPRESSION: Changes consistent with moderate colonic constipation. Electronically Signed   By: Alcide Clever M.D.   On: 11/05/2022 20:27   IR US Guide Bx Asp/Drain  Result Date: 11/04/2022 INDICATION: 89 year old female with right shoulder periprosthetic fluid collections that are suggestive for abscesses. EXAM: 1. Ultrasound-guided placement of drainage catheter in lateral right shoulder abscess collection 2. Ultrasound-guided placement of drainage catheter in the anterior right shoulder abscess collection MEDICATIONS: 1% lidocaine for local anesthetic ANESTHESIA/SEDATION: None COMPLICATIONS: None immediate. PROCEDURE: Informed written consent was obtained from the patient's daughter after a thorough discussion of the procedural risks, benefits and  alternatives. All questions were addressed. Maximal Sterile Barrier Technique was utilized including caps, mask, sterile gowns, sterile gloves, sterile drape, hand hygiene and skin antiseptic. A timeout was performed prior to the initiation of the procedure. The right shoulder was prepped and draped in sterile fashion. Ultrasound demonstrated a large fluid collection along the lateral aspect of the right shoulder. The skin was anesthetized with 1% lidocaine. Small incision was made. Using ultrasound guidance, a Yueh catheter was directed into the fluid collection and pink purulent fluid was aspirated. Superstiff Amplatz wire was placed and the tract was dilated to accommodate a 10 [place] multipurpose drain. Approximately 130 mL of pink purulent fluid was removed. This collection was decompressed. Ultrasound of right shoulder demonstrated persistent fluid collections in the anterior right shoulder region.  The largest right anterior shoulder fluid collection was targeted. The skin was anesthetized with 1% lidocaine. Small incision was made. Using ultrasound guidance, a Yueh catheter was directed into this anterior fluid collection and yellow purulent fluid was aspirated. Superstiff Amplatz wire was placed and an 8 [employer] drain was advanced over the wire. Approximately 40 mL of fluid was removed from this collection. Ultrasound was also used to evaluate the remainder of the right shoulder. Both drains were sutured to skin with Prolene suture. Both drains were attached to suction bulbs. Fluid sample from each drain was sent for culture. FINDINGS: There was a very large fluid collection in the lateral aspect of the right shoulder and 130 mL of pink purulent fluid was removed from this collection. This collection was decompressed at the end of the procedure. There were small fluid collections along the anterior aspect of the right shoulder including a dominant collection. An 8 French drain was placed in the  dominant anterior right fluid collection and this appeared to decompress the other right anterior fluid collections. 40 mL of yellow purulent fluid was removed from the right anterior shoulder fluid collection. IMPRESSION: 1. Ultrasound-guided placement of a drainage catheter in the right lateral shoulder abscess collection. 2. Ultrasound-guided placement of a drainage catheter in the right anterior shoulder abscess. Electronically Signed   By: Richarda Overlie M.D.   On: 11/04/2022 15:25   IR US Guide Bx Asp/Drain  Result Date: 11/04/2022 INDICATION: 89 year old female with right shoulder periprosthetic fluid collections that are suggestive for abscesses. EXAM: 1. Ultrasound-guided placement of drainage catheter in lateral right shoulder abscess collection 2. Ultrasound-guided placement of drainage catheter in the anterior right shoulder abscess collection MEDICATIONS: 1% lidocaine for local anesthetic ANESTHESIA/SEDATION: None COMPLICATIONS: None immediate. PROCEDURE: Informed written consent was obtained from the patient's daughter after a thorough discussion of the procedural risks, benefits and alternatives. All questions were addressed. Maximal Sterile Barrier Technique was utilized including caps, mask, sterile gowns, sterile gloves, sterile drape, hand hygiene and skin antiseptic. A timeout was performed prior to the initiation of the procedure. The right shoulder was prepped and draped in sterile fashion. Ultrasound demonstrated a large fluid collection along the lateral aspect of the right shoulder. The skin was anesthetized with 1% lidocaine. Small incision was made. Using ultrasound guidance, a Yueh catheter was directed into the fluid collection and pink purulent fluid was aspirated. Superstiff Amplatz wire was placed and the tract was dilated to accommodate a 10 [place] multipurpose drain. Approximately 130 mL of pink purulent fluid was removed. This collection was decompressed. Ultrasound of right  shoulder demonstrated persistent fluid collections in the anterior right shoulder region. The largest right anterior shoulder fluid collection was targeted. The skin was anesthetized with 1% lidocaine. Small incision was made. Using ultrasound guidance, a Yueh catheter was directed into this anterior fluid collection and yellow purulent fluid was aspirated. Superstiff Amplatz wire was placed and an 8 [employer] drain was advanced over the wire. Approximately 40 mL of fluid was removed from this collection. Ultrasound was also used to evaluate the remainder of the right shoulder. Both drains were sutured to skin with Prolene suture. Both drains were attached to suction bulbs. Fluid sample from each drain was sent for culture. FINDINGS: There was a very large fluid collection in the lateral aspect of the right shoulder and 130 mL of pink purulent fluid was removed from this collection. This collection was decompressed at the end of the procedure. There were small  fluid collections along the anterior aspect of the right shoulder including a dominant collection. An 8 French drain was placed in the dominant anterior right fluid collection and this appeared to decompress the other right anterior fluid collections. 40 mL of yellow purulent fluid was removed from the right anterior shoulder fluid collection. IMPRESSION: 1. Ultrasound-guided placement of a drainage catheter in the right lateral shoulder abscess collection. 2. Ultrasound-guided placement of a drainage catheter in the right anterior shoulder abscess. Electronically Signed   By: Richarda OverlieAdam  Henn M.D.   On: 11/04/2022 15:25   VAS US LOWER EXTREMITY VENOUS (DVT) (7a-7p)  Result Date: 11/04/2022  Lower Venous DVT Study Patient Name:  Cleatrice BurkeCORA S Vaquera  Date of Exam:   11/03/2022 Medical Rec #: [id number]        Accession #:    [id number] Date of Birth: 1921-02-27        Patient Gender: F Patient Age:   [age] years [facility] Procedure:       VAS US LOWER EXTREMITY VENOUS (DVT) Referring Phys: ADAM CURATOLO --------------------------------------------------------------------------------  Indications: Pain, Edema, and Chronic swelling.  Risk Factors: Surgery Lower ext bypass graft, left leg stenting. Comparison Study: 03/16/21 - Negative LE venous Performing Technologist: [place] Sinkita Sturdivant RDMS, RVT  Examination Guidelines: A complete evaluation includes B-mode imaging, spectral Doppler, color Doppler, and power Doppler as needed of all accessible portions of each vessel. Bilateral testing is considered an integral part of a complete examination. Limited examinations for reoccurring indications may be performed as noted. The reflux portion of the exam is performed with the patient in reverse Trendelenburg.  +---------+---------------+---------+-----------+----------+-------------------+ RIGHT    CompressibilityPhasicitySpontaneityPropertiesThrombus Aging      +---------+---------------+---------+-----------+----------+-------------------+ CFV      Full           Yes      Yes                                      +---------+---------------+---------+-----------+----------+-------------------+ SFJ      Full                                                             +---------+---------------+---------+-----------+----------+-------------------+ FV Prox  Full                                                             +---------+---------------+---------+-----------+----------+-------------------+ FV Mid   Full                                                             +---------+---------------+---------+-----------+----------+-------------------+ FV DistalFull                                                             +---------+---------------+---------+-----------+----------+-------------------+  PFV      Full                                                              +---------+---------------+---------+-----------+----------+-------------------+ POP      Full           Yes      Yes                                      +---------+---------------+---------+-----------+----------+-------------------+ PTV      Full                                                             +---------+---------------+---------+-----------+----------+-------------------+ PERO                                                  Not well visualized +---------+---------------+---------+-----------+----------+-------------------+   Right Technical Findings: Bilateral calf veins were not well visualized. Cannot exclude chronic DVT in the calf veins.  +---------+---------------+---------+-----------+----------+-------------------+ LEFT     CompressibilityPhasicitySpontaneityPropertiesThrombus Aging      +---------+---------------+---------+-----------+----------+-------------------+ CFV      Full           Yes      Yes                                      +---------+---------------+---------+-----------+----------+-------------------+ SFJ      Full                                                             +---------+---------------+---------+-----------+----------+-------------------+ FV Prox  Full                                                             +---------+---------------+---------+-----------+----------+-------------------+ FV Mid   Full                                                             +---------+---------------+---------+-----------+----------+-------------------+ FV DistalFull                                                             +---------+---------------+---------+-----------+----------+-------------------+  PFV      Full                                                             +---------+---------------+---------+-----------+----------+-------------------+ POP      Full           Yes      Yes                                       +---------+---------------+---------+-----------+----------+-------------------+ PTV      Full                                                             +---------+---------------+---------+-----------+----------+-------------------+ PERO                                                  Not well visualized +---------+---------------+---------+-----------+----------+-------------------+     Summary: BILATERAL: - No evidence of acute deep vein thrombosis seen in the lower extremities, bilaterally. -No evidence of popliteal cyst, bilaterally. RIGHT: -   *See table(s) above for measurements and observations. Electronically signed by Gerarda Fraction on 11/04/2022 at 10:55:07 AM.    Final    CT SHOULDER RIGHT W CONTRAST  Result Date: 11/03/2022 CLINICAL DATA:  Septic arthritis suspected, shoulder, xray done. Leukocytosis and elevated serum inflammatory markers. EXAM: CT OF THE UPPER RIGHT EXTREMITY WITH CONTRAST TECHNIQUE: Multidetector CT imaging of the upper right extremity was performed according to the standard protocol following intravenous contrast administration. RADIATION DOSE REDUCTION: This exam was performed according to the departmental dose-optimization program which includes automated exposure control, adjustment of the mA and/or kV according to patient size and/or use of iterative reconstruction technique. CONTRAST:  75mL OMNIPAQUE IOHEXOL 350 MG/ML SOLN COMPARISON:  X-ray 11/03/2022, CT 05/27/2021 FINDINGS: Bones/Joint/Cartilage Long stem right humeral prosthesis. The inferior tip of the prosthesis is not included within the field of view. Chronic superomedial migration of the humeral head component with extensive osseous remodeling of the scapula and distal clavicle. Chronic osteolysis involving the proximal humeral diaphysis, not significantly progressed. No acute fracture. Os acromiale. Ligaments Suboptimally assessed by CT. Muscles and Tendons  Findings of chronic rotator cuff tears with chronic muscle atrophy. Soft tissues Large peripherally enhancing periprosthetic fluid collection surrounding the proximal right humerus measuring approximately 14 x 5 cm in transaxial dimension. Loculated component at the anterior shoulder extends approximately 10 cm in craniocaudal dimension. Collection closely approximates the skin surface at the anterior aspect of the shoulder. Loculated component at the posterior aspect of the shoulder measures approximately 8 cm in craniocaudal dimension. Mildly prominent right axillary lymph nodes are likely reactive. Small layering right-sided pleural effusion. Multinodular goiter, similar to the previous study. In the setting of significant comorbidities or limited life expectancy, no follow-up recommended (ref: J Am Coll Radiol. 2015 Feb;12(2): 143-50). IMPRESSION: 1. Large peripherally-enhancing periprosthetic fluid collection surrounding the proximal right humerus measuring  up to 14 cm in maximal dimension. Findings are most concerning for infected fluid collection/abscess given the patient's history. Aspiration and joint fluid analysis is recommended. 2. Chronic superomedial migration of the humeral head component with extensive osseous remodeling of the scapula and distal clavicle. Chronic osteolysis involving the proximal humeral diaphysis, not significantly progressed. 3. Small layering right-sided pleural effusion. Electronically Signed   By: Duanne GuessNicholas  Plundo D.O.   On: 11/03/2022 15:09   DG Chest 2 View  Result Date: 11/03/2022 CLINICAL DATA:  Chest pain. EXAM: CHEST - 2 VIEW COMPARISON:  October 25, 2022. FINDINGS: Stable cardiomegaly. Large hiatal hernia is noted. Minimal right basilar subsegmental atelectasis is noted. Status post bilateral shoulder arthroplasties. IMPRESSION: Minimal right basilar subsegmental atelectasis. Large hiatal hernia. Electronically Signed   By: Lupita RaiderJames  Green Jr M.D.   On: 11/03/2022 12:06    DG Shoulder Right  Result Date: 11/03/2022 CLINICAL DATA:  Pain EXAM: RIGHT SHOULDER - 3 VIEW COMPARISON:  None Available. FINDINGS: There is a right shoulder prosthesis. There appears to be grossly anatomic alignment. There is absence of the acromial humeral distance indicating the presence of rotator cuff defect. No acute fracture, dislocation or subluxation identified. IMPRESSION: Status post right shoulder arthroplasty. No acute osseous abnormalities are seen. Electronically Signed   By: Layla MawJoshua  Pleasure M.D.   On: 11/03/2022 12:03   DG Chest 2 View  Result Date: 10/25/2022 CLINICAL DATA:  Shortness of breath. EXAM: CHEST - 2 VIEW COMPARISON:  09/28/2022. FINDINGS: The heart is enlarged and the mediastinal contour is within normal limits. A large hiatal hernia is noted. There is atherosclerotic calcification of the aorta. Lung volumes are low with atelectasis at the lung bases. No effusion or pneumothorax. Shoulder arthroplasty changes are present bilaterally. Degenerative changes are noted on the thoracic spine. Surgical clips are present in the right upper quadrant. IMPRESSION: 1. Low lung volumes with mild atelectasis at the lung bases. 2. Cardiomegaly. 3. Large hiatal hernia. Electronically Signed   By: Thornell SartoriusLaura  Taylor M.D.   On: 10/25/2022 22:54    EKG EKG Interpretation  Date/Time:  Friday November 17 2022 19:07:54 EDT Ventricular Rate:  87 PR Interval:  182 QRS Duration: 81 QT Interval:  330 QTC Calculation: 397 R Axis:   41 Text Interpretation: Sinus rhythm Non-specific ST-t changes No significant change since last tracing Confirmed by Cathren LaineSteinl, Timothee Gali ([id number]) on 11/17/2022 7:22:26 PM  Radiology DG Chest Port 1 View  Result Date: 11/17/2022 CLINICAL DATA:  Chest pain. EXAM: PORTABLE CHEST 1 VIEW COMPARISON:  November 16, 2022 FINDINGS: There is stable mild to moderate severity cardiac silhouette enlargement. Marked severity calcification and tortuosity of the thoracic aorta is seen. Low lung  volumes are noted with a stable appearing area of left basilar opacification. There is mild prominence of the perihilar pulmonary vasculature. No pneumothorax is identified. Bilateral shoulder replacements are noted. IMPRESSION: 1. Stable mild to moderate severity cardiac silhouette enlargement. 2. Low lung volumes with stable findings consistent with a known large hiatal hernia and adjacent left basilar atelectasis. Electronically Signed   By: Aram Candelahaddeus  Houston M.D.   On: 11/17/2022 23:29   DG CHEST PORT 1 VIEW  Result Date: 11/16/2022 CLINICAL DATA:  Hypoxia. EXAM: PORTABLE CHEST 1 VIEW COMPARISON:  Radiograph 11/13/2022, chest CT 03/16/2021 FINDINGS: Retrocardiac opacity is likely related to large hiatal hernia. There may be slight increased density in the lateral left lung base compared to prior exam. Stable heart size and mediastinal contours with prominent aortic atherosclerosis. Increasing right upper lobe  airspace disease abutting the fissure in the right mid lung. Similar peribronchial thickening which may be pulmonary edema. Suspected small right pleural effusion. Bilateral humeral head arthroplasties, abutting the undersurface of the clavicle on the right, chronic. IMPRESSION: 1. Increasing right upper lobe airspace disease suspicious for pneumonia. 2. Peribronchial thickening may be pulmonary edema, similar. 3. Probable small right pleural effusion. 4. Left retrocardiac opacity is likely due to large hiatal hernia. There is slight increasing density in the left lateral lung base which may be due to effusion or adjacent atelectasis/airspace disease. Electronically Signed   By: Narda Rutherford M.D.   On: 11/16/2022 17:36    Procedures Procedures    Medications Ordered in ED Medications - No data to display  ED Course/ Medical Decision Making/ A&P                             Medical Decision Making Problems Addressed: Chronic anemia: chronic illness or injury with exacerbation,  progression, or side effects of treatment that poses a threat to life or bodily functions Hiatal hernia: chronic illness or injury History of vomiting: chronic illness or injury Hypoalbuminemia: chronic illness or injury Precordial chest pain: acute illness or injury with systemic symptoms that poses a threat to life or bodily functions  Amount and/or Complexity of Data Reviewed Independent Historian: EMS    Details: Ems/family External Data Reviewed: labs, radiology and notes. Labs: ordered. Decision-making details documented in ED Course. Radiology: ordered and independent interpretation performed. Decision-making details documented in ED Course. ECG/medicine tests: ordered and independent interpretation performed. Decision-making details documented in ED Course.  Risk Prescription drug management. Decision regarding hospitalization.   Iv ns. Continuous pulse ox and cardiac monitoring. Labs ordered/sent.   Differential diagnosis includes dehydration, gastroenteritis, acs, gerd, etc . Dispo decision including potential need for admission considered - will get labs and reassess.   Reviewed nursing notes and prior charts for additional history. External reports reviewed. Additional history from:  Cardiac monitor: sinus rhythm, rate 90.  Labs reviewed/interpreted by me - trop normal. Wbc normal. Hct c/w prior.   Xrays reviewed/interpreted by me - c/w prior, no def new pna.   Recent Xrays reviewed/interpreted by me - noted to have suspected esophageal narrowing/stricture (notes make reference of gi eval and felt not candidate for egd).  Also during hospital stay pt noted to have o2 requirement. Pt is breathing comfortably in ED and no c/o sob. It appears from recent admission that gi/medical team feel many of patients underlying issues are related to grand age, and not candidate for interventional/procedural therapy.   Recheck, no vomiting. No chest pain or discomfort. Po trial.   UA,  uri panel and cxr pending.  2330, signed out to Dr Manus Gunning to check pending studies and dispo appropriately.                Final Clinical Impression(s) / ED Diagnoses Final diagnoses:  Precordial chest pain  Hiatal hernia  History of vomiting  Chronic anemia  Hypoalbuminemia    Rx / DC Orders ED Discharge Orders     None         Cathren Laine, MD 11/17/22 2334

## 2022-11-17 NOTE — Care Management Important Message (Signed)
Important Message  Patient Details  Name: Heidi Dominguez MRN: [id number] Date of Birth: [birth year]   Medicare Important Message Given:  Yes  Patient left prior to IM delivery will mail a copy to the patient home address.    Tristian Sickinger 11/17/2022, 4:11 PM

## 2022-11-17 NOTE — TOC Transition Note (Signed)
Transition of Care [facility]) - CM/SW Discharge Note   Patient Details  Name: Heidi Dominguez MRN: [id number] Date of Birth: May 04, 1921  [facility] CM/SW Contact:  Tory Emerald, LCSW Phone Number: 11/17/2022, 1:38 PM   Clinical Narrative:     PT was denied SNF after peer to peer. Pt returning to [facility] for LTC. Pt's dtr updated, as well as Star at facility. Report number provided to nurse.     Barriers to Discharge: Continued Medical Work up   Patient Goals and CMS Choice   Choice offered to / list presented to : Adult Children (daughter Education administrator)  Discharge Placement                         Discharge Plan and Services Additional resources added to the After Visit Summary for   In-house Referral: Clinical Social Work   Post Acute Care Choice: Skilled Nursing Facility                               Social Determinants of Health (SDOH) Interventions SDOH Screenings   Food Insecurity: No Food Insecurity (11/03/2022)  Housing: Low Risk  (11/03/2022)  Transportation Needs: No Transportation Needs (11/03/2022)  Utilities: Not At Risk (11/03/2022)  Depression (PHQ2-9): Low Risk  (02/09/2021)  Tobacco Use: Low Risk  (11/04/2022)     Readmission Risk Interventions     No data to display

## 2022-11-17 NOTE — Discharge Instructions (Addendum)
It was our pleasure to provide your ER care today - we hope that you feel better.  Drink adequate fluids/stay well hydrated.   Oxygen per nasal cannula 1-3 liters as need.   Follow up closely with primary care doctor in the coming week. Also follow up closely with palliative care medicine - have your doctor coordinate.  Return to ER if worse, new symptoms, new/severe pain, persistent vomiting, or other concern.

## 2022-11-18 ENCOUNTER — Observation Stay (HOSPITAL_COMMUNITY): Payer: Medicare PPO

## 2022-11-18 ENCOUNTER — Emergency Department (HOSPITAL_COMMUNITY): Payer: Medicare PPO

## 2022-11-18 ENCOUNTER — Observation Stay (HOSPITAL_BASED_OUTPATIENT_CLINIC_OR_DEPARTMENT_OTHER): Payer: Medicare PPO

## 2022-11-18 DIAGNOSIS — I2699 Other pulmonary embolism without acute cor pulmonale: Secondary | ICD-10-CM

## 2022-11-18 DIAGNOSIS — J9621 Acute and chronic respiratory failure with hypoxia: Secondary | ICD-10-CM | POA: Diagnosis not present

## 2022-11-18 DIAGNOSIS — R609 Edema, unspecified: Secondary | ICD-10-CM | POA: Diagnosis not present

## 2022-11-18 DIAGNOSIS — R410 Disorientation, unspecified: Secondary | ICD-10-CM

## 2022-11-18 DIAGNOSIS — I350 Nonrheumatic aortic (valve) stenosis: Secondary | ICD-10-CM

## 2022-11-18 LAB — RESP PANEL BY RT-PCR (RSV, FLU A&B, COVID)  RVPGX2
Influenza A by PCR: NEGATIVE
Influenza B by PCR: NEGATIVE
Resp Syncytial Virus by PCR: NEGATIVE
SARS Coronavirus 2 by RT PCR: NEGATIVE

## 2022-11-18 LAB — URINALYSIS, ROUTINE W REFLEX MICROSCOPIC
Bilirubin Urine: NEGATIVE
Glucose, UA: NEGATIVE mg/dL
Hgb urine dipstick: NEGATIVE
Ketones, ur: NEGATIVE mg/dL
Leukocytes,Ua: NEGATIVE
Nitrite: NEGATIVE
Protein, ur: 30 mg/dL — AB
Specific Gravity, Urine: 1.016 (ref 1.005–1.030)
pH: 5 (ref 5.0–8.0)

## 2022-11-18 LAB — RESPIRATORY PANEL BY PCR

## 2022-11-18 LAB — CBG MONITORING, ED: Glucose-Capillary: 162 mg/dL — ABNORMAL HIGH (ref 70–99)

## 2022-11-18 LAB — BLOOD GAS, VENOUS
Acid-Base Excess: 20.9 mmol/L — ABNORMAL HIGH (ref 0.0–2.0)
Bicarbonate: 48.4 mmol/L — ABNORMAL HIGH (ref 20.0–28.0)
Drawn by: 42628
O2 Saturation: 95.3 %
Patient temperature: 36.9
pCO2, Ven: 65 mmHg — ABNORMAL HIGH (ref 44–60)
pH, Ven: 7.48 — ABNORMAL HIGH (ref 7.25–7.43)
pO2, Ven: 60 mmHg — ABNORMAL HIGH (ref 32–45)

## 2022-11-18 LAB — MAGNESIUM: Magnesium: 2 mg/dL (ref 1.7–2.4)

## 2022-11-18 LAB — GLUCOSE, CAPILLARY
Glucose-Capillary: 249 mg/dL — ABNORMAL HIGH (ref 70–99)
Glucose-Capillary: 286 mg/dL — ABNORMAL HIGH (ref 70–99)
Glucose-Capillary: 284 mg/dL — ABNORMAL HIGH (ref 70–99)

## 2022-11-18 LAB — PROCALCITONIN: Procalcitonin: <0.1 ng/mL

## 2022-11-18 LAB — BRAIN NATRIURETIC PEPTIDE: B Natriuretic Peptide: 281.6 pg/mL — ABNORMAL HIGH (ref 0.0–100.0)

## 2022-11-18 LAB — TROPONIN I (HIGH SENSITIVITY): Troponin I (High Sensitivity): 16 ng/L (ref <18)

## 2022-11-18 MED ORDER — METHYLPREDNISOLONE SODIUM SUCC 125 MG IJ SOLR
60.0000 mg | INTRAMUSCULAR | Status: AC
  Administered 2022-11-18: 60 mg via INTRAVENOUS
  Filled 2022-11-18: qty 2

## 2022-11-18 MED ORDER — INSULIN ASPART 100 UNIT/ML IJ SOLN
0.0000 [IU] | Freq: Three times a day (TID) | INTRAMUSCULAR | Status: DC
  Administered 2022-11-18: 5 [IU] via SUBCUTANEOUS
  Administered 2022-11-18: 3 [IU] via SUBCUTANEOUS
  Administered 2022-11-19: 5 [IU] via SUBCUTANEOUS
  Administered 2022-11-19 (×2): 2 [IU] via SUBCUTANEOUS
  Administered 2022-11-20: 3 [IU] via SUBCUTANEOUS
  Administered 2022-11-20: 9 [IU] via SUBCUTANEOUS
  Administered 2022-11-20: 2 [IU] via SUBCUTANEOUS
  Administered 2022-11-21: 7 [IU] via SUBCUTANEOUS
  Administered 2022-11-21 – 2022-11-22 (×3): 2 [IU] via SUBCUTANEOUS
  Administered 2022-11-22: 3 [IU] via SUBCUTANEOUS

## 2022-11-18 MED ORDER — SODIUM CHLORIDE 0.9 % IV SOLN
2.0000 g | Freq: Two times a day (BID) | INTRAVENOUS | Status: DC
  Administered 2022-11-18 – 2022-11-19 (×2): 2 g via INTRAVENOUS
  Filled 2022-11-18 (×2): qty 12.5

## 2022-11-18 MED ORDER — APIXABAN 5 MG PO TABS: 5.0000 mg | ORAL_TABLET | Freq: Two times a day (BID) | ORAL | Status: DC

## 2022-11-18 MED ORDER — AMLODIPINE BESYLATE 10 MG PO TABS
10.0000 mg | ORAL_TABLET | Freq: Every day | ORAL | Status: DC
  Administered 2022-11-18 – 2022-11-19 (×2): 10 mg via ORAL
  Filled 2022-11-18 (×2): qty 1

## 2022-11-18 MED ORDER — MELATONIN 5 MG PO TABS
5.0000 mg | ORAL_TABLET | Freq: Every evening | ORAL | Status: DC | PRN
  Administered 2022-11-18 – 2022-11-21 (×3): 5 mg via ORAL
  Filled 2022-11-18 (×3): qty 1

## 2022-11-18 MED ORDER — ASPIRIN 81 MG PO TBEC
162.0000 mg | DELAYED_RELEASE_TABLET | Freq: Every day | ORAL | Status: DC
  Administered 2022-11-18: 162 mg via ORAL
  Filled 2022-11-18: qty 2

## 2022-11-18 MED ORDER — FUROSEMIDE 10 MG/ML IJ SOLN
20.0000 mg | Freq: Every day | INTRAMUSCULAR | Status: DC
  Administered 2022-11-18 – 2022-11-20 (×3): 20 mg via INTRAVENOUS
  Filled 2022-11-18 (×3): qty 2

## 2022-11-18 MED ORDER — APIXABAN 5 MG PO TABS
10.0000 mg | ORAL_TABLET | Freq: Once | ORAL | Status: AC
  Administered 2022-11-18: 10 mg via ORAL
  Filled 2022-11-18: qty 2

## 2022-11-18 MED ORDER — APIXABAN 5 MG PO TABS
10.0000 mg | ORAL_TABLET | Freq: Two times a day (BID) | ORAL | Status: DC
  Administered 2022-11-18 – 2022-11-22 (×8): 10 mg via ORAL
  Filled 2022-11-18 (×8): qty 2

## 2022-11-18 MED ORDER — SODIUM CHLORIDE 0.9% FLUSH: 3.0000 mL | INTRAVENOUS | Status: DC | PRN

## 2022-11-18 MED ORDER — BUDESONIDE 0.5 MG/2ML IN SUSP
0.5000 mg | Freq: Two times a day (BID) | RESPIRATORY_TRACT | Status: DC
  Administered 2022-11-18 – 2022-11-22 (×8): 0.5 mg via RESPIRATORY_TRACT
  Filled 2022-11-18 (×8): qty 2

## 2022-11-18 MED ORDER — ACETAMINOPHEN 650 MG RE SUPP: 650.0000 mg | Freq: Four times a day (QID) | RECTAL | Status: DC | PRN

## 2022-11-18 MED ORDER — DM-GUAIFENESIN ER 30-600 MG PO TB12
1.0000 | ORAL_TABLET | Freq: Two times a day (BID) | ORAL | Status: DC
  Administered 2022-11-18 – 2022-11-22 (×8): 1 via ORAL
  Filled 2022-11-18 (×7): qty 1

## 2022-11-18 MED ORDER — INSULIN DETEMIR 100 UNIT/ML ~~LOC~~ SOLN
5.0000 [IU] | Freq: Every day | SUBCUTANEOUS | Status: DC
  Administered 2022-11-18 – 2022-11-22 (×5): 5 [IU] via SUBCUTANEOUS
  Filled 2022-11-18 (×5): qty 0.05

## 2022-11-18 MED ORDER — ALBUTEROL SULFATE (2.5 MG/3ML) 0.083% IN NEBU
2.5000 mg | INHALATION_SOLUTION | RESPIRATORY_TRACT | Status: DC | PRN
  Administered 2022-11-20 – 2022-11-21 (×2): 2.5 mg via RESPIRATORY_TRACT
  Filled 2022-11-18 (×2): qty 3

## 2022-11-18 MED ORDER — LORATADINE 10 MG PO TABS
10.0000 mg | ORAL_TABLET | Freq: Every day | ORAL | Status: DC
  Administered 2022-11-18 – 2022-11-21 (×4): 10 mg via ORAL
  Filled 2022-11-18 (×4): qty 1

## 2022-11-18 MED ORDER — ACETAMINOPHEN 325 MG PO TABS: 650.0000 mg | ORAL_TABLET | Freq: Four times a day (QID) | ORAL | Status: DC | PRN

## 2022-11-18 MED ORDER — AMLODIPINE BESYLATE 10 MG PO TABS: 10.0000 mg | ORAL_TABLET | Freq: Every day | ORAL | Status: DC

## 2022-11-18 MED ORDER — IOHEXOL 350 MG/ML SOLN
50.0000 mL | Freq: Once | INTRAVENOUS | Status: AC | PRN
  Administered 2022-11-18: 50 mL via INTRAVENOUS

## 2022-11-18 MED ORDER — POLYETHYLENE GLYCOL 3350 17 G PO PACK: 17.0000 g | PACK | Freq: Every evening | ORAL | Status: DC | PRN

## 2022-11-18 MED ORDER — PREDNISONE 20 MG PO TABS
40.0000 mg | ORAL_TABLET | Freq: Every day | ORAL | Status: DC
  Administered 2022-11-19 – 2022-11-21 (×3): 40 mg via ORAL
  Filled 2022-11-18 (×3): qty 2

## 2022-11-18 MED ORDER — ORAL CARE MOUTH RINSE: 15.0000 mL | OROMUCOSAL | Status: DC | PRN

## 2022-11-18 MED ORDER — PANTOPRAZOLE SODIUM 40 MG PO TBEC
40.0000 mg | DELAYED_RELEASE_TABLET | Freq: Every day | ORAL | Status: DC
  Administered 2022-11-18 – 2022-11-22 (×5): 40 mg via ORAL
  Filled 2022-11-18 (×5): qty 1

## 2022-11-18 MED ORDER — SODIUM CHLORIDE 0.9 % IV SOLN: 250.0000 mL | INTRAVENOUS | Status: DC | PRN

## 2022-11-18 MED ORDER — POLYVINYL ALCOHOL 1.4 % OP SOLN
2.0000 [drp] | Freq: Every day | OPHTHALMIC | Status: DC
  Administered 2022-11-18 – 2022-11-22 (×5): 2 [drp] via OPHTHALMIC
  Filled 2022-11-18: qty 15

## 2022-11-18 MED ORDER — SODIUM CHLORIDE 0.9% FLUSH
3.0000 mL | Freq: Two times a day (BID) | INTRAVENOUS | Status: DC
  Administered 2022-11-18 – 2022-11-22 (×9): 3 mL via INTRAVENOUS

## 2022-11-18 MED ORDER — IPRATROPIUM-ALBUTEROL 0.5-2.5 (3) MG/3ML IN SOLN
3.0000 mL | Freq: Four times a day (QID) | RESPIRATORY_TRACT | Status: DC
  Administered 2022-11-18 – 2022-11-19 (×6): 3 mL via RESPIRATORY_TRACT
  Filled 2022-11-18 (×6): qty 3

## 2022-11-18 MED ORDER — IPRATROPIUM-ALBUTEROL 0.5-2.5 (3) MG/3ML IN SOLN
3.0000 mL | RESPIRATORY_TRACT | Status: DC
  Filled 2022-11-18: qty 3

## 2022-11-18 MED ORDER — FUROSEMIDE 10 MG/ML IJ SOLN
20.0000 mg | Freq: Once | INTRAMUSCULAR | Status: AC
  Administered 2022-11-18: 20 mg via INTRAVENOUS
  Filled 2022-11-18: qty 2

## 2022-11-18 NOTE — ED Notes (Signed)
Pt is awake in bed using call light to request a bedpan. (Pt has a brief on and was changed with assistance from tech). Pt is a&o to baseline. Pt is attached to monitor/vitals, bed rails up, call light in hands of pt., covered with blankets. Pt is also on 1.5 lpm O2 via [place]. Pt denies any complaints.

## 2022-11-18 NOTE — ED Notes (Signed)
PO challenge passed 

## 2022-11-18 NOTE — ED Notes (Signed)
Patient cleaned and changed brief, denies complaints. No acute distress noted, call bell in reach.

## 2022-11-18 NOTE — Progress Notes (Signed)
NEW ADMISSION NOTE New Admission Note:   Arrival Method: Patient arrived from ED on stretcher. Mental Orientation: Alert to self. Telemetry: 47M-07 Assessment: Completed Skin: warm, dry and intact with ecchymosis noted left abdomen IV: R FA SL Pain: Denies any pain. Tubes: purewick Safety Measures: Safety Fall Prevention Plan has been given, discussed and signed Admission: in progress [facility] Orientation: Patient has been orientated to the room, unit and staff.  Family: NOne  Orders have been reviewed and implemented. Will continue to monitor the patient. Call light has been placed within reach and bed alarm has been activated.   Arvilla Meres, RN

## 2022-11-18 NOTE — Assessment & Plan Note (Signed)
Echo 11/09/22: severe aortic stenosis  Not a candidate for aggressive intervention Likely contributing to some of her symptoms Gentle IV diuresis

## 2022-11-18 NOTE — H&P (Signed)
History and Physical    Patient: Heidi Dominguez ZDG:[id number] DOB: 1921/03/12 DOA: 11/17/2022 DOS: the patient was seen and examined on 11/18/2022 PCP: Merlene Laughter, MD  Patient coming from: [facility]. Uses walker.    Chief Complaint: low oxygen and chest pain   HPI: Heidi Dominguez is a 89 y.o. female with medical history significant of   HFpEF, dysphagia, chronic hypoxic respiratory failure, COPD, normocytic anemia T2DM, GERD with large hiatal hernia, CKD stage 3, HTN, HLD and recent admission for right shoulder PJI complicated by acute CHF and hyponatremia who returns to ED shortly after discharge after an episode of acute chest pain and SOB.  She states she got home from the hospital and her oxygen was low. Family confirmed this. They also said she complained of chest pain and gave her 2 NG and sent her right back to the hospital. She denies any shortness of breath or chest pain now.  She does state she has been wheezing and coughing more and keeps asking for her mask for a breathing treatment. She also states she has never been on oxygen. Family states she has never had respiratory failure and never been on oxygen and was not on oxygen when she left. Interestingly in the notes from pulmonology she as discharged on oxygen after a hospital stay in February for RSV, but family states she never was on oxygen.   She denies any chest pain, but occasional palpitations.  She has a cough that she has had for a long time that is occasionally productive and looks like spit.     Denies any fever/chills, vision changes/headaches, chest pain or palpitations, abdominal pain, N/V/D, dysuria. +leg swelling, but she states it's the same as it was.   Does not smoke or drink.     Admitted 3/22-11/17/22.   ER Course:  vitals: afebrile, bp: 147/63, HR: 93, RR: 23, oxygen: 96% on 2L [place] Pertinent labs: hgb: 8.8, CO2:37, BNP: 281,  CXR: stable mild to moderate severity cardiac silhouette enlargement. Low  lung volumes. Large hiatal hernia and adjacent left basilar atelectasis.  CTA chest: single small branch point arterial embolism in posterior right lower lobe. No other embolus is seen. Prominent pulmonary trunk and main pulmonary arteries with reflux into the IVC and hepatic veins suggesting right heart dysfunction.  Cardiomegaly, increased pericardial effusion Small layering pleural effusion with adjacent atelectasis or consolidation in lower lobes  Large hiatal hernia with inverted intrathoracic stomach, mild body wall edema.  4.4x3.3cm right thyroid nodule, biopsied in 2017 In ED: given IV lasix and started on eliquis. TR[facility] asked to admit.   Review of Systems: As mentioned in the history of present illness. All other systems reviewed and are negative. Past Medical History:  Diagnosis Date   Acute pancreatitis    hx of    Arthritis    "qwhere" (07/04/2018)   Chronic back pain    "all over" (07/04/2018)   Chronic diastolic CHF (congestive heart failure) (HCC)    Chronic kidney disease    stage  3 chronic kidney disease    Complication of anesthesia    "I have a hard time waking up"   Degenerative joint disease of shoulder region    Dry eye syndrome    Dyspnea    Family history of adverse reaction to anesthesia    "daughter has the shakes when she wakes up"   GERD (gastroesophageal reflux disease)    Heart murmur    High cholesterol  History of hiatal hernia    HOH (hard of hearing)    Hypertension    Impacted cerumen of both ears    hx of    Muscle weakness (generalized)    Osteoarthritis    Overactive bladder    Pancreatitis    hx of    Phlebitis    "BLE"   Rotator cuff tear    right    Tear of right supraspinatus tendon    Thrombocytopenia (HCC)    hx of    Type II diabetes mellitus (HCC)    Urinary tract infection    hx of    UTI (urinary tract infection) 09/12/2018   Xerosis cutis    hx of    Past Surgical History:  Procedure Laterality Date   ABDOMINAL  AORTOGRAM W/LOWER EXTREMITY N/A 12/22/2020   Procedure: ABDOMINAL AORTOGRAM W/LOWER EXTREMITY;  Surgeon: Leonie DouglasHawken, Thomas N, MD;  Location: [facility];  Service: Cardiovascular;  Laterality: N/A;   ABDOMINAL HYSTERECTOMY     BACK SURGERY     CATARACT EXTRACTION W/ INTRAOCULAR LENS  IMPLANT, BILATERAL Bilateral    CHOLECYSTECTOMY  2016   ERCP N/A 08/30/2018   Procedure: ENDOSCOPIC RETROGRADE CHOLANGIOPANCREATOGRAPHY (ERCP);  Surgeon: Vida RiggerMagod, Marc, MD;  [facility];  Service: Endoscopy;  Laterality: N/A;   FIXATION KYPHOPLASTY     INCISION AND DRAINAGE Right 11/08/2018   Procedure: INCISION AND DRAINAGE right shoulder, placement of antibiotic beads ;  Surgeon: Beverely LowNorris, Steve, MD;  Location: [facility];  Service: Orthopedics;  Laterality: Right;   IR US GUIDE BX ASP/DRAIN  11/04/2022   IR US GUIDE BX ASP/DRAIN  11/04/2022   JOINT REPLACEMENT     LAPAROSCOPIC CHOLECYSTECTOMY SINGLE SITE WITH INTRAOPERATIVE CHOLANGIOGRAM N/A 04/11/2015   Procedure: LAPAROSCOPIC LYSIS OF ADHESIONS, LAPAROSCOPIC CHOLECYSTECTOMY WITH INTRAOPERATIVE CHOLANGIOGRAM;  Surgeon: Karie SodaSteven Gross, MD;  Location: [facility];  Service: General;  Laterality: N/A;   PANCREATIC STENT PLACEMENT  08/30/2018   Procedure: PANCREATIC STENT PLACEMENT;  Surgeon: Vida RiggerMagod, Marc, MD;  Location: [facility];  Service: Endoscopy;;   PERIPHERAL VASCULAR INTERVENTION  12/22/2020   Procedure: PERIPHERAL VASCULAR INTERVENTION;  Surgeon: Leonie DouglasHawken, Thomas N, MD;  Location: [facility];  Service: Cardiovascular;;  left anterior tibial artery   REMOVAL OF STONES  08/30/2018   Procedure: REMOVAL OF STONES;  Surgeon: Vida RiggerMagod, Marc, MD;  Location: [facility];  Service: Endoscopy;;   SHOULDER OPEN ROTATOR CUFF REPAIR Bilateral    SPHINCTEROTOMY  08/30/2018   Procedure: SPHINCTEROTOMY;  Surgeon: Vida RiggerMagod, Marc, MD;  Location: [facility];  Service: Endoscopy;;   TOTAL KNEE ARTHROPLASTY Bilateral    Social History:  reports that she has never smoked. She  has never used smokeless tobacco. She reports that she does not drink alcohol and does not use drugs.  Allergies  Allergen Reactions   Augmentin [Amoxicillin-Pot Clavulanate] Diarrhea   Bactrim [Sulfamethoxazole-Trimethoprim] Diarrhea and Itching   Calan [Verapamil] Diarrhea   Catapres [Clonidine Hcl] Other (See Comments)    Unknown reaction Not documented on MAR    Codeine Other (See Comments)    "Makes me out of my head, loopy"   Hydrocodone Other (See Comments)    "makes me go out of my head, loopy"   Other Other (See Comments)    Any type of narcotics Feels "loopy"    Sulfa Antibiotics Itching   Zestril [Lisinopril] Cough   Azulfidine [Sulfasalazine] Itching    Family History  Problem Relation Age of Onset   Hypertension Mother  Diabetes Mellitus I Mother    Hypertension Father     Prior to Admission medications   Medication Sig Start Date End Date Taking? Authorizing Provider  acetaminophen (TYLENOL) 325 MG tablet Take 650 mg by mouth 3 (three) times daily.   Yes [provider]  albuterol (VENTOLIN HFA) 108 (90 Base) MCG/ACT inhaler Inhale 2 puffs into the lungs every 6 (six) hours as needed for wheezing or shortness of breath.   Yes [provider]  amLODipine (NORVASC) 10 MG tablet Take 10 mg by mouth daily. HOLD if SBP < 110.   Yes [provider]  aspirin EC 81 MG tablet Take 162 mg by mouth at bedtime.    [provider]  budesonide (PULMICORT) 0.5 MG/2ML nebulizer solution Take 2 mLs (0.5 mg total) by nebulization in the morning and at bedtime. 10/16/22   Cobb, Ruby Cola, NP  cefadroxil (DURICEF) 500 MG capsule Take 2 capsules (1,000 mg total) by mouth 2 (two) times daily. 11/15/22 12/15/22  DanfordEarl Lites, MD  Cholecalciferol (VITAMIN D3) 25 MCG (1000 UT) capsule Take 1,000 Units by mouth every Monday.    [provider]  Cinnamon 500 MG capsule Take 500 mg by mouth daily.    [provider]   dextromethorphan-guaiFENesin (MUCINEX DM) 30-600 MG 12hr tablet Take 1 tablet by mouth 2 (two) times daily. 10/16/22   Cobb, Ruby Cola, NP  diclofenac Sodium (VOLTAREN ARTHRITIS PAIN) 1 % GEL Apply 2 g topically 3 (three) times daily. To both shoulders.    [provider]  furosemide (LASIX) 20 MG tablet Take 1 tablet (20 mg total) by mouth daily. 11/17/22   Danford, Earl Lites, MD  hypromellose (GENTEAL SEVERE) 0.3 % GEL ophthalmic ointment Place 1 Application into both eyes 2 (two) times daily.    [provider]  insulin lispro (HUMALOG) 100 UNIT/ML injection Inject 0-12 Units into the skin 3 (three) times daily before meals. Per sliding scale: If blood sugar is less than 70, call NP/PA. If blood sugar is 70 to 200, give 0 units. If blood sugar is 201 to 250, give 2 units. If blood sugar is 251 to 300, give 4 units. If blood sugar is 301 to 350, give 6 units. If blood sugar is 351 to 400, give 8 units. If blood sugar is 401 to 450, give 10 units. If blood sugar is 451 to 600, give 12 units. Recheck in 2 hours.    [provider]  ipratropium-albuterol (DUONEB) 0.5-2.5 (3) MG/3ML SOLN Take 3 mLs by nebulization 2 (two) times daily. 09/19/22   [provider]  Lactobacillus (PROBIOTIC ACIDOPHILUS) CAPS Take 1 capsule by mouth at bedtime.    [provider]  LEVEMIR FLEXPEN 100 UNIT/ML FlexPen Inject 5 Units into the skin daily. 11/02/22   [provider]  loratadine (CLARITIN) 10 MG tablet Take 10 mg by mouth at bedtime.    [provider]  metFORMIN (GLUCOPHAGE) 500 MG tablet Take 0.5 tablets (250 mg total) by mouth 2 (two) times daily with a meal. Patient taking differently: Take 500 mg by mouth 2 (two) times daily with a meal. 10/26/22 11/25/22  Jeannie Fend, PA-C  Multiple Vitamins-Minerals (PRESERVISION AREDS) CAPS Take 1 capsule by mouth daily.    [provider]  OMEPRAZOLE PO Take 20 mg by mouth daily. Omeprazole 20  mg DR disintegrating tablet.    [provider]  ondansetron (ZOFRAN) 4 MG tablet Take 4 mg by mouth every 8 (eight)  hours as needed for nausea or vomiting.    [provider]  Polyethyl Glycol-Propyl Glycol (LUBRICANT EYE DROPS) 0.4-0.3 % SOLN Place 2 drops into both eyes daily.    [provider]  polyethylene glycol powder (GLYCOLAX/MIRALAX) 17 GM/SCOOP powder Take 17 g by mouth at bedtime.    [provider]  polyvinyl alcohol (LIQUIFILM TEARS) 1.4 % ophthalmic solution Place 1 drop into both eyes in the morning, at noon, in the evening, and at bedtime. Following injection treatment    [provider]    Physical Exam: Vitals:   11/18/22 0800 11/18/22 0834 11/18/22 0935 11/18/22 1431  BP: (!) 148/73  (!) 153/74   Pulse: 89  96   Resp: (!) 25  17   Temp:  98.5 F (36.9 C) 97.8 F (36.6 C)   TempSrc:   Oral   SpO2: 96%  100% 98%  Weight:   84.1 kg   Height:   5\' 4"  (1.626 m)    General:  Appears calm and comfortable and is in NAD Eyes:  PERRL, EOMI, normal lids, iris ENT:  hard of hearing, lips & tongue, mmm; appropriate dentition Neck:  no LAD, masses or thyromegaly; no carotid bruits, +JVD Cardiovascular:  RRR, harsh systolic murmur. 3+ pitting LE edema.  Respiratory:   expiratory wheezing throughout, crackles in bilateral bases, but >left. Decreased air movement. Dyspnea with breathing.  Abdomen:  soft, NT, ND, NABS Back:   normal alignment, no CVAT Skin:  no rash or induration seen on limited exam Musculoskeletal:  grossly normal tone BUE/BLE, good ROM, right shoulder with bandages. Limited exam  Lower extremity: Limited foot exam with no ulcerations.  2+ distal pulses. Psychiatric:  grossly normal mood and affect, speech fluent and appropriate, AOx3 Neurologic:  CN 2-12 grossly intact, moves all extremities in coordinated fashion, sensation intact   Radiological Exams on Admission: Independently reviewed - see discussion in A/P  where applicable  CT Angio Chest PE W and/or Wo Contrast  Result Date: 11/18/2022 CLINICAL DATA:  Suspected pulmonary embolism.  High probability. EXAM: CT ANGIOGRAPHY CHEST WITH CONTRAST TECHNIQUE: Multidetector CT imaging of the chest was performed using the standard protocol during bolus administration of intravenous contrast. Multiplanar CT image reconstructions and MIPs were obtained to evaluate the vascular anatomy. RADIATION DOSE REDUCTION: This exam was performed according to the departmental dose-optimization program which includes automated exposure control, adjustment of the mA and/or kV according to patient size and/or use of iterative reconstruction technique. CONTRAST:  50mL OMNIPAQUE IOHEXOL 350 MG/ML SOLN COMPARISON:  Portable chest yesterday, portable chest 11/16/2022 and 11/13/2022, CTA chest 03/16/2021 FINDINGS: Cardiovascular: The pulmonary trunk is prominent measuring 3.6 cm indicating arterial hypertension, previously 3.2 cm. Both main pulmonary arteries approaching 3 cm. There is a single small branch point embolism noted in the posterior basal right lower lobe on 6:85. No other embolus is seen, but small-vessel emboli are difficult to exclude given atelectasis/consolidation in both lower lobes associated with the effusions. The mitral ring, coronary arteries and aorta are heavily calcified. There is aortic tortuosity. There are patchy calcifications along the aortic valve leaflets. There are scattered calcifications of the great vessels. There is no aortic or great vessel aneurysm, stenosis or dissection. Moderate panchamber cardiomegaly has increased in the interval as well as a circumferential pericardial effusion which now measures 1.8 cm thickness. The pulmonary veins are also prominent. There is engorgement in the left atrial appendage. Reflux seen into the IVC and hepatic veins may suggest right heart dysfunction  but there is no disproportionate right heart chamber enlargement.  Mediastinum/Nodes: Hypodense right thyroid nodule is not as well seen today due to extensive metallic artifact from bilateral shoulder replacements. This has been previously biopsied in 2017. Estimated current measurements 4.4 x 3.3 cm, previously 4.0 x 3.5 cm The nodule again displaces the trachea to the left does not efface the tracheal lumen. Streak artifact from the shoulder replacements continues over the mediastinum. No adenopathy is seen through the streak artifacts. There is a large hiatal hernia with inverted intrathoracic stomach. The esophagus is patulous and otherwise unremarkable. Thoracic trachea is clear. Lungs/Pleura: There is no overt pulmonary edema. There are bilateral small layering pleural effusions with adjacent atelectasis or consolidation in the lower lobes. There is chronic linear scarring in the anterior lung apices. There is increased atelectasis in the lingular base. There is no pneumothorax.  The lungs are otherwise clear. Upper Abdomen: No acute abnormality. Musculoskeletal: Osteopenia and degenerative change thoracic spine. No acute or significant osseous findings. There is mild edema in the body wall. Review of the MIP images confirms the above findings. IMPRESSION: 1. Single small branch point arterial embolism in the posterior basal right lower lobe. No other embolus is seen, but small-vessel emboli are difficult to exclude. 2. Prominent pulmonary trunk and main pulmonary arteries, with reflux into the IVC and hepatic veins which may suggest right heart dysfunction. No disproportionate right heart chamber enlargement. 3. Cardiomegaly, interval increased pericardial effusion, and prominent pulmonary veins, without overt pulmonary edema. 4. Small layering pleural effusions with adjacent atelectasis or consolidation in the lower lobes. 5. Aortic and coronary artery atherosclerosis. 6. Large hiatal hernia with inverted intrathoracic stomach. 7. Mild body wall edema. 8. 4.4 x 3.3 cm  right thyroid nodule, previously biopsied in 2017. 9. Osteopenia and degenerative change. Electronically Signed   By: Almira Bar M.D.   On: 11/18/2022 05:15   DG Chest Port 1 View  Result Date: 11/17/2022 CLINICAL DATA:  Chest pain. EXAM: PORTABLE CHEST 1 VIEW COMPARISON:  November 16, 2022 FINDINGS: There is stable mild to moderate severity cardiac silhouette enlargement. Marked severity calcification and tortuosity of the thoracic aorta is seen. Low lung volumes are noted with a stable appearing area of left basilar opacification. There is mild prominence of the perihilar pulmonary vasculature. No pneumothorax is identified. Bilateral shoulder replacements are noted. IMPRESSION: 1. Stable mild to moderate severity cardiac silhouette enlargement. 2. Low lung volumes with stable findings consistent with a known large hiatal hernia and adjacent left basilar atelectasis. Electronically Signed   By: Aram Candela M.D.   On: 11/17/2022 23:29   DG CHEST PORT 1 VIEW  Result Date: 11/16/2022 CLINICAL DATA:  Hypoxia. EXAM: PORTABLE CHEST 1 VIEW COMPARISON:  Radiograph 11/13/2022, chest CT 03/16/2021 FINDINGS: Retrocardiac opacity is likely related to large hiatal hernia. There may be slight increased density in the lateral left lung base compared to prior exam. Stable heart size and mediastinal contours with prominent aortic atherosclerosis. Increasing right upper lobe airspace disease abutting the fissure in the right mid lung. Similar peribronchial thickening which may be pulmonary edema. Suspected small right pleural effusion. Bilateral humeral head arthroplasties, abutting the undersurface of the clavicle on the right, chronic. IMPRESSION: 1. Increasing right upper lobe airspace disease suspicious for pneumonia. 2. Peribronchial thickening may be pulmonary edema, similar. 3. Probable small right pleural effusion. 4. Left retrocardiac opacity is likely due to large hiatal hernia. There is slight increasing  density in the left lateral lung base which may  be due to effusion or adjacent atelectasis/airspace disease. Electronically Signed   By: Narda Rutherford M.D.   On: 11/16/2022 17:36    EKG: Independently reviewed.  NSR with rate 87; nonspecific ST changes with no evidence of acute ischemia   Labs on Admission: I have personally reviewed the available labs and imaging studies at the time of the admission.  Pertinent labs:   hgb: 8.8,  CO2:37,  BNP: 281  Assessment and Plan: Principal Problem:   Acute on chronic respiratory failure with hypoxia Active Problems:   Pulmonary embolism   COPD with acute exacerbation   Acute on chronic diastolic CHF (congestive heart failure)   Right shoulder prosthetic joint infection   Severe aortic stenosis   Confusion   Diabetes mellitus type 2, controlled   Essential hypertension   CKD (chronic kidney disease) stage 3, GFR 30-59 ml/min   Gastroesophageal reflux disease   Normocytic anemia    Assessment and Plan: * Acute on chronic respiratory failure with hypoxia 89 year old presenting to ED with complaints of hypoxia at SNF (mid 3s) after just returning from hospital d/c and requiring 2L via [place] to maintain saturation here in ED -obs to telemetry -unsure if on chronic oxygen at home because her family states she was never on oxygen at [facility] and hospital d/c says nothing about chronic home oxygen use. Acute on chornic vs. Acute?  -multifactorial in setting of COPD exacerbation, CHF exacerbation/severe aortic stenosis, large hiatal hernia and new small PE   -treating COPD exacerbation/CHF exacerbation. Aortic stenosis is severe and she is not a candidate for intervention.  -started on eliquis, but conservation with family regarding fall risk in setting of severe AS, they will discuss and let us know but okay with initiation.  -aspiration precautions. Risk for silent aspiration  -wean as tolerated and will need to see if needs home oxygen at discharge    Pulmonary embolism CTA shows small PE Check bilateral dopplers Started eliquis in ED and had discussion with family about AC and fall risk, especially with severe AS  They will discuss and let day team know   COPD with acute exacerbation Patient wheezing with acute respiratory failure on 2L oxygen and crackles in bases with increased shortness of breath, cough with sputum -covid/flu negative -check RVP/PCT  -start IV solumedrol 60mg  x1 and change to oral prednisone x 5 days -scheduled duo-nebs -SABA prn -pulmicort BID  -flutter valve -mucinex -RT consult -stat vbg  -cefepime IV with recent abx use   Acute on chronic diastolic CHF (congestive heart failure) -presenting with 3+pitting edema, acute respiratory failure, elevated BNP, crackles in lung bases, CTA showing cardiomegaly and increased pericardial effusion with small layering pleural effusion consistent with acute on chronic diastolic CHF  -echo 10/2022: normal EF. Grade 1 DD. Small pericardial effusion present. Severe AS  -strict I/O -daily weights -just discharged yesterday and was treated for CHF exacerbation  -continue gentle IV lasix with severe aortic stenosis  -limited echo with increased pericardial effusion, no signs/symptoms of tamponade.  -check magnesium   Right shoulder prosthetic joint infection H/o bilateral shoulder replacement and prior infection right prosthetic shoulder.  Admitted on 11/03/22:  Ortho and ID were consulted.  She is not an operative candidate for removal of hardware or debridement.  IR were consulted and aspirated her fluid collection which grew E coli.  ID recommended 6 weeks cefadroxil, outpatient follow up and likely lifelong suppressive therapy after that. Just discharged yesterday so has not started -on IV  cefepime for COPD exacerbation which will cover until d/c on cefadroxil   Severe aortic stenosis Echo 11/09/22: severe aortic stenosis  Not a candidate for aggressive  intervention Likely contributing to some of her symptoms Gentle IV diuresis   Confusion Patient alert and oriented on my exam, but hard of hearing Family reports she has had confusion since being in hospital and had reported delirium Nursing called and stated she was confused for them, no acute neuro deficits Will check  head CT Neuro checks Stat vbg Delirium precautions   Diabetes mellitus type 2, controlled A1C of 7.4 in 09/2022 Hold metformin Continue levemir 5 units daily Sensitive SSI and accuchecks qac/hs   Essential hypertension On norvasc only with parameters to hold. Last had on 3/22 Continue while in hospital.   CKD (chronic kidney disease) stage 3, GFR 30-59 ml/min Stable, continue to monitor   Gastroesophageal reflux disease Continue omeprazole  Normocytic anemia Stable at baseline, continue to monitor     Advance Care Planning:   Code Status: DNR   Consults: RT  DVT Prophylaxis: eliquis   Family Communication: updated daughter and niece on the phone.   Severity of Illness: The appropriate patient status for this patient is OBSERVATION. Observation status is judged to be reasonable and necessary in order to provide the required intensity of service to ensure the patient's safety. The patient's presenting symptoms, physical exam findings, and initial radiographic and laboratory data in the context of their medical condition is felt to place them at decreased risk for further clinical deterioration. Furthermore, it is anticipated that the patient will be medically stable for discharge from the hospital within 2 midnights of admission.   Author: Orland Mustard, MD 11/18/2022 3:24 PM  For on call review [email or web address].

## 2022-11-18 NOTE — Progress Notes (Signed)
PHARMACY NOTE:  ANTIMICROBIAL RENAL DOSAGE ADJUSTMENT  Current antimicrobial regimen includes a mismatch between antimicrobial dosage and estimated renal function.  As per policy approved by the Pharmacy & Therapeutics and Medical Executive Committees, the antimicrobial dosage will be adjusted accordingly.  Current antimicrobial dosage:  Cefepime 2gm IV q8h x 5 days  Indication: COPD exacerbation  Renal Function:  Estimated Creatinine Clearance: 40.2 mL/min (by C-G formula based on SCr of 0.59 mg/dL). []      On intermittent HD, scheduled: []      On CRRT    Antimicrobial dosage has been changed to:    - Cefepime 2gm IV q12h x 5 days  Additional comments: - was discharged on 11/17/22 on Cefadroxil 1gm PO BID for right shoulder prosthetic joint infection, with plan to continue until 12/15/22, then change to 500 mg PO BID indefinitely for suppression due to hardware in place, per ID. - E coli in cultures  Thank you for allowing pharmacy to be a part of this patient's care.  Dennie Fetters, [facility] 11/18/2022 11:22 AM

## 2022-11-18 NOTE — Assessment & Plan Note (Signed)
Continue omeprazole 

## 2022-11-18 NOTE — Assessment & Plan Note (Signed)
Patient alert and oriented on my exam, but hard of hearing Family reports she has had confusion since being in hospital and had reported delirium Nursing called and stated she was confused for them, no acute neuro deficits Will check  head CT Neuro checks Stat vbg Delirium precautions

## 2022-11-18 NOTE — Assessment & Plan Note (Signed)
A1C of 7.4 in 09/2022 Hold metformin Continue levemir 5 units daily Sensitive SSI and accuchecks qac/hs

## 2022-11-18 NOTE — Progress Notes (Signed)
ANTICOAGULATION CONSULT NOTE - Initial Consult  Pharmacy Consult for Eliquis Indication: pulmonary embolus  Allergies  Allergen Reactions   Augmentin [Amoxicillin-Pot Clavulanate] Diarrhea   Bactrim [Sulfamethoxazole-Trimethoprim] Diarrhea and Itching   Calan [Verapamil] Diarrhea   Catapres [Clonidine Hcl] Other (See Comments)    Unknown reaction Not documented on MAR    Codeine Other (See Comments)    "Makes me out of my head, loopy"   Hydrocodone Other (See Comments)    "makes me go out of my head, loopy"   Other Other (See Comments)    Any type of narcotics Feels "loopy"    Sulfa Antibiotics Itching   Zestril [Lisinopril] Cough   Azulfidine [Sulfasalazine] Itching    Patient Measurements: Height: 5\' 4"  (162.6 cm) Weight: 84.1 kg (185 lb 6.5 oz) IBW/kg (Calculated) : 54.7  Vital Signs: Temp: 97.8 F (36.6 C) (04/06 0935) Temp Source: Oral (04/06 0935) BP: 153/74 (04/06 0935) Pulse Rate: 96 (04/06 0935)  Labs: Recent Labs    11/16/22 0357 11/17/22 0930 11/17/22 2027 11/18/22 0209  HGB 8.9* 8.8* 9.0*  --   HCT 29.5* 29.3* 30.0*  --   PLT 364 373 382  --   CREATININE 0.68 0.55 0.59  --   TROPONINIHS  --   --  17 16    Estimated Creatinine Clearance: 40.2 mL/min (by C-G formula based on SCr of 0.59 mg/dL).   Medical History: Past Medical History:  Diagnosis Date   Acute pancreatitis    hx of    Arthritis    "qwhere" (07/04/2018)   Chronic back pain    "all over" (07/04/2018)   Chronic diastolic CHF (congestive heart failure) (HCC)    Chronic kidney disease    stage  3 chronic kidney disease    Complication of anesthesia    "I have a hard time waking up"   Degenerative joint disease of shoulder region    Dry eye syndrome    Dyspnea    Family history of adverse reaction to anesthesia    "daughter has the shakes when she wakes up"   GERD (gastroesophageal reflux disease)    Heart murmur    High cholesterol    History of hiatal hernia    HOH  (hard of hearing)    Hypertension    Impacted cerumen of both ears    hx of    Muscle weakness (generalized)    Osteoarthritis    Overactive bladder    Pancreatitis    hx of    Phlebitis    "BLE"   Rotator cuff tear    right    Tear of right supraspinatus tendon    Thrombocytopenia (HCC)    hx of    Type II diabetes mellitus (HCC)    Urinary tract infection    hx of    UTI (urinary tract infection) 09/12/2018   Xerosis cutis    hx of    Assessment:  89 yr old female just discharged 11/17/22 am then readmitted in pm with chest pain. CTA 4/6 early am showed RLL pulmonary embolus.  Eliquis 10 mg PO x 1 given ~6am and to continue.    Was on Heparin 5000 units SQ q8h while inpatient 3/22>>11/17/22. Hgb 11.2 on admit 3/22 >> trended down to 8.7-9.0 since 11/13/22.  Planning CT head due to confusion and LE duplex.  Goal of Therapy:  Appropriate Eliquis regimen for indication Monitor platelets by anticoagulation protocol: Yes   Plan:  Continue Eliquis 10 mg PO BID  x 1 week, then 5 mg PO BID. Intermittent CBC; monitor for signs/symptoms of bleeding. Will follow up head CT and LE duplex.  Dennie Fetters, [place] 11/18/2022,12:40 PM

## 2022-11-18 NOTE — Assessment & Plan Note (Addendum)
89 year old presenting to ED with complaints of hypoxia at SNF (mid 47s) after just returning from hospital d/c and requiring 2L via [place] to maintain saturation here in ED -obs to telemetry -unsure if on chronic oxygen at home because her family states she was never on oxygen at [facility] and hospital d/c says nothing about chronic home oxygen use. Acute on chornic vs. Acute?  -multifactorial in setting of COPD exacerbation, CHF exacerbation/severe aortic stenosis, large hiatal hernia and new small PE   -treating COPD exacerbation/CHF exacerbation. Aortic stenosis is severe and she is not a candidate for intervention.  -started on eliquis, but conservation with family regarding fall risk in setting of severe AS, they will discuss and let us know but okay with initiation.  -aspiration precautions. Risk for silent aspiration  -wean as tolerated and will need to see if needs home oxygen at discharge

## 2022-11-18 NOTE — Assessment & Plan Note (Addendum)
CTA shows small PE Check bilateral dopplers Started eliquis in ED and had discussion with family about [facility] and fall risk, especially with severe AS  They will discuss and let day team know

## 2022-11-18 NOTE — Progress Notes (Signed)
VASCULAR LAB    Bilateral lower extremity venous duplex has been performed.  See CV proc for preliminary results.   Waymon Laser, RVT 11/18/2022, 5:40 PM

## 2022-11-18 NOTE — Assessment & Plan Note (Signed)
Stable at baseline, continue to monitor  

## 2022-11-18 NOTE — ED Notes (Signed)
Pt's brief changed with help from tech. Pt has small abrasion to right butt cheek.

## 2022-11-18 NOTE — Assessment & Plan Note (Addendum)
On norvasc only with parameters to hold. Last had on 3/22 Continue while in hospital- hold until head CT back

## 2022-11-18 NOTE — Assessment & Plan Note (Signed)
Stable, continue to monitor  ?

## 2022-11-18 NOTE — Assessment & Plan Note (Addendum)
-  presenting with 3+pitting edema, acute respiratory failure, elevated BNP, crackles in lung bases, CTA showing cardiomegaly and increased pericardial effusion with small layering pleural effusion consistent with acute on chronic diastolic CHF  -echo 10/2022: normal EF. Grade 1 DD. Small pericardial effusion present. Severe AS  -strict I/O -daily weights -just discharged yesterday and was treated for CHF exacerbation  -continue gentle IV lasix with severe aortic stenosis  -limited echo with increased pericardial effusion, no signs/symptoms of tamponade.  -check magnesium

## 2022-11-18 NOTE — ED Provider Notes (Signed)
Care assumed from Dr. Denton Lank.  Patient discharged in the hospital today after prolonged admission for infected shoulder hardware and diastolic heart failure complicated by hyponatremia.  Presents with chest discomfort and shortness of breath.  Dementia limits history.  Tachypneic with increased oxygen requirement compared to baseline.  CXR stable with reduced lung volumes and enlarged heart.   It appears from recent admission the GI team thought many of the patient's underlying issues related to her age and was not a candidate for interventional procedures.  Awaiting BNP, urinalysis, COVID swab as well as CT angiogram to rule out pulmonary embolism versus worsening heart failure.  UA is negative.  COVID and flu swabs are negative  CTA shows small subsegmental pulmonary embolism.  Evidence of pulmonary hypertension, right heart failure, increased pleural effusions and pericardial effusion.  IV Lasix given.  Eliquis dose given. Admission for diuresis d/w Dr. Antionette Char.    Glynn Octave, MD 11/18/22 [id number]

## 2022-11-18 NOTE — ED Notes (Signed)
ED TO INPATIENT HANDOFF REPORT  ED Nurse Name and Phone #:   [phone]   S Name/Age/Gender Heidi Dominguez 89 y.o. female Room/Bed: 006C/006C  Code Status   Code Status: Prior  Home/SNF/Other Skilled nursing facility Patient oriented to: self, place, and situation Is this baseline? Yes   Triage Complete: Triage complete  Chief Complaint Pulmonary embolism [I26.99]  Triage Note Pt BIB EMS from [facility] nursing facility. Per EMS, pt was discharged from being admitted here after a blood clot was removed from her arm. Pt started complaining of chest pain 1 hour after going back to nursing facility. Pt's O2 sat dropped to the mid 80s per nurses from facility. Pt denies chest pain or SOB at this time. Pt on 1L [employer] @ baseline. A/Ox4.    Allergies Allergies  Allergen Reactions   Augmentin [Amoxicillin-Pot Clavulanate] Diarrhea   Bactrim [Sulfamethoxazole-Trimethoprim] Diarrhea and Itching   Calan [Verapamil] Diarrhea   Catapres [Clonidine Hcl] Other (See Comments)    Unknown reaction Not documented on MAR    Codeine Other (See Comments)    "Makes me out of my head, loopy"   Hydrocodone Other (See Comments)    "makes me go out of my head, loopy"   Other Other (See Comments)    Any type of narcotics Feels "loopy"    Sulfa Antibiotics Itching   Zestril [Lisinopril] Cough   Azulfidine [Sulfasalazine] Itching    Level of Care/Admitting Diagnosis ED Disposition     ED Disposition  Admit   Condition  --   Comment  Hospital Area: [facility] [[id number]]  Level of Care: Telemetry Medical [104]  May place patient in observation at [facility] or [facility] if equivalent level of care is available:: Yes  Covid Evaluation: Asymptomatic - no recent exposure (last 10 days) testing not required  Diagnosis: Pulmonary embolism [241700]  Admitting Physician: Briscoe Deutscher [[id number]]  Attending Physician: Briscoe Deutscher [[id number]]           B Medical/Surgery History Past Medical History:  Diagnosis Date   Acute pancreatitis    hx of    Arthritis    "qwhere" (07/04/2018)   Chronic back pain    "all over" (07/04/2018)   Chronic diastolic CHF (congestive heart failure) (HCC)    Chronic kidney disease    stage  3 chronic kidney disease    Complication of anesthesia    "I have a hard time waking up"   Degenerative joint disease of shoulder region    Dry eye syndrome    Dyspnea    Family history of adverse reaction to anesthesia    "daughter has the shakes when she wakes up"   GERD (gastroesophageal reflux disease)    Heart murmur    High cholesterol    History of hiatal hernia    HOH (hard of hearing)    Hypertension    Impacted cerumen of both ears    hx of    Muscle weakness (generalized)    Osteoarthritis    Overactive bladder    Pancreatitis    hx of    Phlebitis    "BLE"   Rotator cuff tear    right    Tear of right supraspinatus tendon    Thrombocytopenia (HCC)    hx of    Type II diabetes mellitus (HCC)    Urinary tract infection    hx of    UTI (urinary tract infection) 09/12/2018   Xerosis cutis  hx of    Past Surgical History:  Procedure Laterality Date   ABDOMINAL AORTOGRAM W/LOWER EXTREMITY N/A 12/22/2020   Procedure: ABDOMINAL AORTOGRAM W/LOWER EXTREMITY;  Surgeon: Leonie Douglas, MD;  Location: [facility]VASIVE [facility];  Service: Cardiovascular;  Laterality: N/A;   ABDOMINAL HYSTERECTOMY     BACK SURGERY     CATARACT EXTRACTION W/ INTRAOCULAR LENS  IMPLANT, BILATERAL Bilateral    CHOLECYSTECTOMY  2016   ERCP N/A 08/30/2018   Procedure: ENDOSCOPIC RETROGRADE CHOLANGIOPANCREATOGRAPHY (ERCP);  Surgeon: Vida Rigger, MD;  Location: [facility];  Service: Endoscopy;  Laterality: N/A;   FIXATION KYPHOPLASTY     INCISION AND DRAINAGE Right 11/08/2018   Procedure: INCISION AND DRAINAGE right shoulder, placement of antibiotic beads ;  Surgeon: Beverely Low, MD;  Location: [facility] ORS;  Service:  Orthopedics;  Laterality: Right;   IR US GUIDE BX ASP/DRAIN  11/04/2022   IR US GUIDE BX ASP/DRAIN  11/04/2022   JOINT REPLACEMENT     LAPAROSCOPIC CHOLECYSTECTOMY SINGLE SITE WITH INTRAOPERATIVE CHOLANGIOGRAM N/A 04/11/2015   Procedure: LAPAROSCOPIC LYSIS OF ADHESIONS, LAPAROSCOPIC CHOLECYSTECTOMY WITH INTRAOPERATIVE CHOLANGIOGRAM;  Surgeon: Karie Soda, MD;  Location: [facility] ORS;  Service: General;  Laterality: N/A;   PANCREATIC STENT PLACEMENT  08/30/2018   Procedure: PANCREATIC STENT PLACEMENT;  Surgeon: Vida Rigger, MD;  Location: [facility] ENDOSCOPY;  Service: Endoscopy;;   PERIPHERAL VASCULAR INTERVENTION  12/22/2020   Procedure: PERIPHERAL VASCULAR INTERVENTION;  Surgeon: Leonie Douglas, MD;  Location: [facility];  Service: Cardiovascular;;  left anterior tibial artery   REMOVAL OF STONES  08/30/2018   Procedure: REMOVAL OF STONES;  Surgeon: Vida Rigger, MD;  Location: [facility];  Service: Endoscopy;;   SHOULDER OPEN ROTATOR CUFF REPAIR Bilateral    SPHINCTEROTOMY  08/30/2018   Procedure: SPHINCTEROTOMY;  Surgeon: Vida Rigger, MD;  Location: [facility];  Service: Endoscopy;;   TOTAL KNEE ARTHROPLASTY Bilateral      A IV Location/Drains/Wounds Patient Lines/Drains/Airways Status     Active Line/Drains/Airways     Name Placement date Placement time Site Days   Peripheral IV 11/17/22 20 G 1" Posterior;Right Forearm 11/17/22  2100  Forearm  1   External Urinary Catheter 11/03/22  1332  --  15            Intake/Output Last 24 hours No intake or output data in the 24 hours ending 11/18/22 0846  Labs/Imaging Results for orders placed or performed during the hospital encounter of 11/17/22 (from the past 48 hour(s))  CBC     Status: Abnormal   Collection Time: 11/17/22  8:27 PM  Result Value Ref Range   WBC 10.1 4.0 - 10.5 K/uL   RBC 3.34 (L) 3.87 - 5.11 MIL/uL   Hemoglobin 9.0 (L) 12.0 - 15.0 g/dL   HCT 50.0 (L) 37.0 - 48.8 %   MCV 89.8 80.0 - 100.0 fL   MCH 26.9 26.0 -  34.0 pg   MCHC 30.0 30.0 - 36.0 g/dL   RDW 89.1 (H) 69.4 - 50.3 %   Platelets 382 150 - 400 K/uL   nRBC 0.0 0.0 - 0.2 %    Comment: Performed at [facility], [address], [place], [place] [address]  Comprehensive metabolic panel     Status: Abnormal   Collection Time: 11/17/22  8:27 PM  Result Value Ref Range   Sodium 134 (L) 135 - 145 mmol/L   Potassium 4.7 3.5 - 5.1 mmol/L   Chloride 87 (L) 98 - 111 mmol/L  CO2 37 (H) 22 - 32 mmol/L   Glucose, Bld 165 (H) 70 - 99 mg/dL    Comment: Glucose reference range applies only to samples taken after fasting for at least 8 hours.   BUN 15 8 - 23 mg/dL   Creatinine, Ser 1.61 0.44 - 1.00 mg/dL   Calcium 9.5 8.9 - 09.6 mg/dL   Total Protein 6.5 6.5 - 8.1 g/dL   Albumin 2.2 (L) 3.5 - 5.0 g/dL   AST 17 15 - 41 U/L   ALT 16 0 - 44 U/L   Alkaline Phosphatase 63 38 - 126 U/L   Total Bilirubin 0.5 0.3 - 1.2 mg/dL   GFR, Estimated >04 >54 mL/min    Comment: (NOTE) Calculated using the CKD-EPI Creatinine Equation (2021)    Anion gap 10 5 - 15    Comment: Performed at [facility], [address] [place], [place] [address]  Lipase, blood     Status: None   Collection Time: 11/17/22  8:27 PM  Result Value Ref Range   Lipase 20 11 - 51 U/L    Comment: Performed at [facility], [address], [place], [place] [address]  Troponin I (High Sensitivity)     Status: None   Collection Time: 11/17/22  8:27 PM  Result Value Ref Range   Troponin I (High Sensitivity) 17 <18 ng/L    Comment: (NOTE) Elevated high sensitivity troponin I (hsTnI) values and significant  changes across serial measurements may suggest ACS but many other  chronic and acute conditions are known to elevate hsTnI results.  Refer to the "Links" section for chest pain algorithms and additional  guidance. Performed at [facility], [address], [place], [place] [address]   Urinalysis, Routine w reflex microscopic -Urine, Catheterized     Status:  Abnormal   Collection Time: 11/17/22 11:53 PM  Result Value Ref Range   Color, Urine YELLOW YELLOW   APPearance CLEAR CLEAR   Specific Gravity, Urine 1.016 1.005 - 1.030   pH 5.0 5.0 - 8.0   Glucose, UA NEGATIVE NEGATIVE mg/dL   Hgb urine dipstick NEGATIVE NEGATIVE   Bilirubin Urine NEGATIVE NEGATIVE   Ketones, ur NEGATIVE NEGATIVE mg/dL   Protein, ur 30 (A) NEGATIVE mg/dL   Nitrite NEGATIVE NEGATIVE   Leukocytes,Ua NEGATIVE NEGATIVE   RBC / HPF 0-5 0 - 5 RBC/hpf   WBC, UA 0-5 0 - 5 WBC/hpf   Bacteria, UA RARE (A) NONE SEEN   Squamous Epithelial / HPF 0-5 0 - 5 /HPF   Hyaline Casts, UA PRESENT     Comment: Performed at [facility], [address] [place], [place] [address]  Resp panel by RT-PCR (RSV, Flu A&B, Covid) Urine, Catheterized     Status: None   Collection Time: 11/17/22 11:56 PM   Specimen: Urine, Catheterized; Nasal Swab  Result Value Ref Range   SARS Coronavirus 2 by RT PCR NEGATIVE NEGATIVE   Influenza A by PCR NEGATIVE NEGATIVE   Influenza B by PCR NEGATIVE NEGATIVE    Comment: (NOTE) The Xpert Xpress SARS-CoV-2/FLU/RSV plus assay is intended as an aid in the diagnosis of influenza from Nasopharyngeal swab specimens and should not be used as a sole basis for treatment. Nasal washings and aspirates are unacceptable for Xpert Xpress SARS-CoV-2/FLU/RSV testing.  Fact Sheet for Patients: BloggerCourse.com  Fact Sheet for Healthcare Providers: SeriousBroker.it  This test is not yet approved or cleared by the [place] and  has been authorized for detection and/or diagnosis of SARS-CoV-2 by FDA under an Emergency Use Authorization (EUA). This EUA will remain in effect (meaning this test can be used) for the duration of the COVID-19 declaration under Section 564(b)(1) of the Act, 21 U.S.C. section 360bbb-3(b)(1), unless the authorization is terminated or revoked.     Resp Syncytial Virus by PCR  NEGATIVE NEGATIVE    Comment: (NOTE) Fact Sheet for Patients: BloggerCourse.com  Fact Sheet for Healthcare Providers: SeriousBroker.it  This test is not yet approved or cleared by the [place] FDA and has been authorized for detection and/or diagnosis of SARS-CoV-2 by FDA under an Emergency Use Authorization (EUA). This EUA will remain in effect (meaning this test can be used) for the duration of the COVID-19 declaration under Section 564(b)(1) of the Act, 21 U.S.C. section 360bbb-3(b)(1), unless the authorization is terminated or revoked.  Performed at [facility], [address] [place], [place] [address]   Brain natriuretic peptide     Status: Abnormal   Collection Time: 11/18/22 12:53 AM  Result Value Ref Range   B Natriuretic Peptide 281.6 (H) 0.0 - 100.0 pg/mL    Comment: Performed at [facility], [address], [place], [place] [address]  Troponin I (High Sensitivity)     Status: None   Collection Time: 11/18/22  2:09 AM  Result Value Ref Range   Troponin I (High Sensitivity) 16 <18 ng/L    Comment: (NOTE) Elevated high sensitivity troponin I (hsTnI) values and significant  changes across serial measurements may suggest ACS but many other  chronic and acute conditions are known to elevate hsTnI results.  Refer to the "Links" section for chest pain algorithms and additional  guidance. Performed at [facility], [address], [place], [place] [address]    CT Angio Chest PE W and/or Wo Contrast  Result Date: 11/18/2022 CLINICAL DATA:  Suspected pulmonary embolism.  High probability. EXAM: CT ANGIOGRAPHY CHEST WITH CONTRAST TECHNIQUE: Multidetector CT imaging of the chest was performed using the standard protocol during bolus administration of intravenous contrast. Multiplanar CT image reconstructions and MIPs were obtained to evaluate the vascular anatomy. RADIATION DOSE REDUCTION: This exam was  performed according to the departmental dose-optimization program which includes automated exposure control, adjustment of the mA and/or kV according to patient size and/or use of iterative reconstruction technique. CONTRAST:  50mL OMNIPAQUE IOHEXOL 350 MG/ML SOLN COMPARISON:  Portable chest yesterday, portable chest 11/16/2022 and 11/13/2022, CTA chest 03/16/2021 FINDINGS: Cardiovascular: The pulmonary trunk is prominent measuring 3.6 cm indicating arterial hypertension, previously 3.2 cm. Both main pulmonary arteries approaching 3 cm. There is a single small branch point embolism noted in the posterior basal right lower lobe on 6:85. No other embolus is seen, but small-vessel emboli are difficult to exclude given atelectasis/consolidation in both lower lobes associated with the effusions. The mitral ring, coronary arteries and aorta are heavily calcified. There is aortic tortuosity. There are patchy calcifications along the aortic valve leaflets. There are scattered calcifications of the great vessels. There is no aortic or great vessel aneurysm, stenosis or dissection. Moderate panchamber cardiomegaly has increased in the interval as well as a circumferential pericardial effusion which now measures 1.8 cm thickness. The pulmonary veins are also prominent. There is engorgement in the left atrial appendage. Reflux seen into the IVC and hepatic veins may suggest right heart dysfunction but there is no disproportionate right heart chamber enlargement. Mediastinum/Nodes: Hypodense right thyroid nodule is not as well  seen today due to extensive metallic artifact from bilateral shoulder replacements. This has been previously biopsied in 2017. Estimated current measurements 4.4 x 3.3 cm, previously 4.0 x 3.5 cm The nodule again displaces the trachea to the left does not efface the tracheal lumen. Streak artifact from the shoulder replacements continues over the mediastinum. No adenopathy is seen through the streak  artifacts. There is a large hiatal hernia with inverted intrathoracic stomach. The esophagus is patulous and otherwise unremarkable. Thoracic trachea is clear. Lungs/Pleura: There is no overt pulmonary edema. There are bilateral small layering pleural effusions with adjacent atelectasis or consolidation in the lower lobes. There is chronic linear scarring in the anterior lung apices. There is increased atelectasis in the lingular base. There is no pneumothorax.  The lungs are otherwise clear. Upper Abdomen: No acute abnormality. Musculoskeletal: Osteopenia and degenerative change thoracic spine. No acute or significant osseous findings. There is mild edema in the body wall. Review of the MIP images confirms the above findings. IMPRESSION: 1. Single small branch point arterial embolism in the posterior basal right lower lobe. No other embolus is seen, but small-vessel emboli are difficult to exclude. 2. Prominent pulmonary trunk and main pulmonary arteries, with reflux into the IVC and hepatic veins which may suggest right heart dysfunction. No disproportionate right heart chamber enlargement. 3. Cardiomegaly, interval increased pericardial effusion, and prominent pulmonary veins, without overt pulmonary edema. 4. Small layering pleural effusions with adjacent atelectasis or consolidation in the lower lobes. 5. Aortic and coronary artery atherosclerosis. 6. Large hiatal hernia with inverted intrathoracic stomach. 7. Mild body wall edema. 8. 4.4 x 3.3 cm right thyroid nodule, previously biopsied in 2017. 9. Osteopenia and degenerative change. Electronically Signed   By: Almira BarKeith  Chesser M.D.   On: 11/18/2022 05:15   DG Chest Port 1 View  Result Date: 11/17/2022 CLINICAL DATA:  Chest pain. EXAM: PORTABLE CHEST 1 VIEW COMPARISON:  November 16, 2022 FINDINGS: There is stable mild to moderate severity cardiac silhouette enlargement. Marked severity calcification and tortuosity of the thoracic aorta is seen. Low lung volumes  are noted with a stable appearing area of left basilar opacification. There is mild prominence of the perihilar pulmonary vasculature. No pneumothorax is identified. Bilateral shoulder replacements are noted. IMPRESSION: 1. Stable mild to moderate severity cardiac silhouette enlargement. 2. Low lung volumes with stable findings consistent with a known large hiatal hernia and adjacent left basilar atelectasis. Electronically Signed   By: Aram Candelahaddeus  Houston M.D.   On: 11/17/2022 23:29   DG CHEST PORT 1 VIEW  Result Date: 11/16/2022 CLINICAL DATA:  Hypoxia. EXAM: PORTABLE CHEST 1 VIEW COMPARISON:  Radiograph 11/13/2022, chest CT 03/16/2021 FINDINGS: Retrocardiac opacity is likely related to large hiatal hernia. There may be slight increased density in the lateral left lung base compared to prior exam. Stable heart size and mediastinal contours with prominent aortic atherosclerosis. Increasing right upper lobe airspace disease abutting the fissure in the right mid lung. Similar peribronchial thickening which may be pulmonary edema. Suspected small right pleural effusion. Bilateral humeral head arthroplasties, abutting the undersurface of the clavicle on the right, chronic. IMPRESSION: 1. Increasing right upper lobe airspace disease suspicious for pneumonia. 2. Peribronchial thickening may be pulmonary edema, similar. 3. Probable small right pleural effusion. 4. Left retrocardiac opacity is likely due to large hiatal hernia. There is slight increasing density in the left lateral lung base which may be due to effusion or adjacent atelectasis/airspace disease. Electronically Signed   By: Ivette LoyalMelanie  Sanford M.D.  On: 11/16/2022 17:36    Pending Labs Unresulted Labs (From admission, onward)    None       Vitals/Pain Today's Vitals   11/18/22 0430 11/18/22 0722 11/18/22 0800 11/18/22 0834  BP: (!) 146/65  (!) 148/73   Pulse: 94  89   Resp: (!) 24  (!) 25   Temp: 98 F (36.7 C)   98.5 F (36.9 C)  TempSrc:  Oral     SpO2: 95%  96%   PainSc: 0-No pain 0-No pain      Isolation Precautions No active isolations  Medications Medications  insulin aspart (novoLOG) injection 0-9 Units (has no administration in time range)  furosemide (LASIX) injection 20 mg (20 mg Intravenous Given 11/18/22 0427)  iohexol (OMNIPAQUE) 350 MG/ML injection 50 mL (50 mLs Intravenous Contrast Given 11/18/22 0418)  apixaban (ELIQUIS) tablet 10 mg (10 mg Oral Given 11/18/22 [id number])    Mobility non-ambulatory     Focused Assessments Cardiac Assessment Handoff:    Lab Results  Component Value Date   CKTOTAL 24 (L) 02/09/2021   CKMB 1.5 02/27/2012   TROPONINI 0.03 (HH) 08/27/2018   Lab Results  Component Value Date   DDIMER 3.39 (H) 03/16/2021   Does the Patient currently have chest pain? No    R Recommendations: See Admitting Provider Note  Report given to:   Additional Notes:  Pt currently denies cp

## 2022-11-18 NOTE — Assessment & Plan Note (Signed)
Patient wheezing with acute respiratory failure on 2L oxygen and crackles in bases with increased shortness of breath, cough with sputum -covid/flu negative -check RVP/PCT  -start IV solumedrol 60mg  x1 and change to oral prednisone x 5 days -scheduled duo-nebs -SABA prn -pulmicort BID  -flutter valve -mucinex -RT consult -stat vbg  -cefepime IV with recent abx use

## 2022-11-18 NOTE — Assessment & Plan Note (Signed)
H/o bilateral shoulder replacement and prior infection right prosthetic shoulder.  Admitted on 11/03/22:  Ortho and ID were consulted.  She is not an operative candidate for removal of hardware or debridement.  IR were consulted and aspirated her fluid collection which grew E coli.  ID recommended 6 weeks cefadroxil, outpatient follow up and likely lifelong suppressive therapy after that. Just discharged yesterday so has not started -on IV cefepime for COPD exacerbation which will cover until d/c on cefadroxil

## 2022-11-18 NOTE — ED Notes (Signed)
Pt catheterized in/out for urine sample, Aggie Cosier, RN at bedside to assist. Pt in no acute distress, vitals stable, call bell in reach.

## 2022-11-19 ENCOUNTER — Observation Stay (HOSPITAL_COMMUNITY): Payer: Medicare PPO

## 2022-11-19 DIAGNOSIS — I130 Hypertensive heart and chronic kidney disease with heart failure and stage 1 through stage 4 chronic kidney disease, or unspecified chronic kidney disease: Secondary | ICD-10-CM | POA: Diagnosis present

## 2022-11-19 DIAGNOSIS — E222 Syndrome of inappropriate secretion of antidiuretic hormone: Secondary | ICD-10-CM | POA: Diagnosis present

## 2022-11-19 DIAGNOSIS — T8459XA Infection and inflammatory reaction due to other internal joint prosthesis, initial encounter: Secondary | ICD-10-CM | POA: Diagnosis present

## 2022-11-19 DIAGNOSIS — Z6832 Body mass index (BMI) 32.0-32.9, adult: Secondary | ICD-10-CM | POA: Diagnosis not present

## 2022-11-19 DIAGNOSIS — Z66 Do not resuscitate: Secondary | ICD-10-CM | POA: Diagnosis present

## 2022-11-19 DIAGNOSIS — E1151 Type 2 diabetes mellitus with diabetic peripheral angiopathy without gangrene: Secondary | ICD-10-CM | POA: Diagnosis present

## 2022-11-19 DIAGNOSIS — Z794 Long term (current) use of insulin: Secondary | ICD-10-CM | POA: Diagnosis not present

## 2022-11-19 DIAGNOSIS — N1831 Chronic kidney disease, stage 3a: Secondary | ICD-10-CM | POA: Diagnosis present

## 2022-11-19 DIAGNOSIS — D631 Anemia in chronic kidney disease: Secondary | ICD-10-CM | POA: Diagnosis present

## 2022-11-19 DIAGNOSIS — E7800 Pure hypercholesterolemia, unspecified: Secondary | ICD-10-CM | POA: Diagnosis present

## 2022-11-19 DIAGNOSIS — I5033 Acute on chronic diastolic (congestive) heart failure: Secondary | ICD-10-CM | POA: Diagnosis present

## 2022-11-19 DIAGNOSIS — J9621 Acute and chronic respiratory failure with hypoxia: Secondary | ICD-10-CM | POA: Diagnosis present

## 2022-11-19 DIAGNOSIS — F0390 Unspecified dementia without behavioral disturbance: Secondary | ICD-10-CM | POA: Diagnosis present

## 2022-11-19 DIAGNOSIS — I2729 Other secondary pulmonary hypertension: Secondary | ICD-10-CM | POA: Diagnosis present

## 2022-11-19 DIAGNOSIS — Z79899 Other long term (current) drug therapy: Secondary | ICD-10-CM | POA: Diagnosis not present

## 2022-11-19 DIAGNOSIS — J441 Chronic obstructive pulmonary disease with (acute) exacerbation: Secondary | ICD-10-CM | POA: Diagnosis present

## 2022-11-19 DIAGNOSIS — I2693 Single subsegmental pulmonary embolism without acute cor pulmonale: Secondary | ICD-10-CM | POA: Diagnosis present

## 2022-11-19 DIAGNOSIS — Y831 Surgical operation with implant of artificial internal device as the cause of abnormal reaction of the patient, or of later complication, without mention of misadventure at the time of the procedure: Secondary | ICD-10-CM | POA: Diagnosis present

## 2022-11-19 DIAGNOSIS — I3139 Other pericardial effusion (noninflammatory): Secondary | ICD-10-CM | POA: Diagnosis present

## 2022-11-19 DIAGNOSIS — J9622 Acute and chronic respiratory failure with hypercapnia: Secondary | ICD-10-CM | POA: Diagnosis present

## 2022-11-19 DIAGNOSIS — R0789 Other chest pain: Secondary | ICD-10-CM | POA: Diagnosis present

## 2022-11-19 DIAGNOSIS — I2699 Other pulmonary embolism without acute cor pulmonale: Secondary | ICD-10-CM | POA: Diagnosis present

## 2022-11-19 DIAGNOSIS — E1122 Type 2 diabetes mellitus with diabetic chronic kidney disease: Secondary | ICD-10-CM | POA: Diagnosis present

## 2022-11-19 DIAGNOSIS — I350 Nonrheumatic aortic (valve) stenosis: Secondary | ICD-10-CM | POA: Diagnosis present

## 2022-11-19 DIAGNOSIS — I5082 Biventricular heart failure: Secondary | ICD-10-CM | POA: Diagnosis present

## 2022-11-19 DIAGNOSIS — E8809 Other disorders of plasma-protein metabolism, not elsewhere classified: Secondary | ICD-10-CM | POA: Diagnosis present

## 2022-11-19 DIAGNOSIS — Z1152 Encounter for screening for COVID-19: Secondary | ICD-10-CM | POA: Diagnosis not present

## 2022-11-19 LAB — PROTIME-INR
INR: 1.2 (ref 0.8–1.2)
Prothrombin Time: 15.5 seconds — ABNORMAL HIGH (ref 11.4–15.2)

## 2022-11-19 LAB — ECHOCARDIOGRAM LIMITED
Height: 64 in
S' Lateral: 2.6 cm
Weight: 2881.85 oz

## 2022-11-19 LAB — CBC
HCT: 28.8 % — ABNORMAL LOW (ref 36.0–46.0)
Hemoglobin: 8.7 g/dL — ABNORMAL LOW (ref 12.0–15.0)
MCH: 26.6 pg (ref 26.0–34.0)
MCHC: 30.2 g/dL (ref 30.0–36.0)
MCV: 88.1 fL (ref 80.0–100.0)
Platelets: 375 10*3/uL (ref 150–400)
RBC: 3.27 MIL/uL — ABNORMAL LOW (ref 3.87–5.11)
RDW: 16.5 % — ABNORMAL HIGH (ref 11.5–15.5)
WBC: 8.2 10*3/uL (ref 4.0–10.5)
nRBC: 0 % (ref 0.0–0.2)

## 2022-11-19 LAB — BASIC METABOLIC PANEL
Anion gap: 9 (ref 5–15)
BUN: 9 mg/dL (ref 8–23)
CO2: 40 mmol/L — ABNORMAL HIGH (ref 22–32)
Calcium: 9.3 mg/dL (ref 8.9–10.3)
Chloride: 81 mmol/L — ABNORMAL LOW (ref 98–111)
Creatinine, Ser: 0.52 mg/dL (ref 0.44–1.00)
GFR, Estimated: >60 mL/min (ref >60)
Glucose, Bld: 245 mg/dL — ABNORMAL HIGH (ref 70–99)
Potassium: 4.2 mmol/L (ref 3.5–5.1)
Sodium: 130 mmol/L — ABNORMAL LOW (ref 135–145)

## 2022-11-19 LAB — GLUCOSE, CAPILLARY
Glucose-Capillary: 196 mg/dL — ABNORMAL HIGH (ref 70–99)
Glucose-Capillary: 276 mg/dL — ABNORMAL HIGH (ref 70–99)
Glucose-Capillary: 185 mg/dL — ABNORMAL HIGH (ref 70–99)
Glucose-Capillary: 228 mg/dL — ABNORMAL HIGH (ref 70–99)

## 2022-11-19 LAB — PROCALCITONIN: Procalcitonin: <0.1 ng/mL

## 2022-11-19 MED ORDER — GLUCERNA SHAKE PO LIQD
237.0000 mL | Freq: Two times a day (BID) | ORAL | Status: DC
  Administered 2022-11-20 – 2022-11-22 (×6): 237 mL via ORAL

## 2022-11-19 MED ORDER — AMLODIPINE BESYLATE 5 MG PO TABS
5.0000 mg | ORAL_TABLET | Freq: Every day | ORAL | Status: DC
  Administered 2022-11-20 – 2022-11-22 (×3): 5 mg via ORAL
  Filled 2022-11-19 (×3): qty 1

## 2022-11-19 MED ORDER — CEFADROXIL 500 MG PO CAPS
1000.0000 mg | ORAL_CAPSULE | Freq: Two times a day (BID) | ORAL | Status: DC
  Administered 2022-11-19 – 2022-11-22 (×7): 1000 mg via ORAL
  Filled 2022-11-19 (×7): qty 2

## 2022-11-19 MED ORDER — CEFADROXIL 500 MG PO CAPS: 500.0000 mg | ORAL_CAPSULE | Freq: Two times a day (BID) | ORAL | Status: DC

## 2022-11-19 MED ORDER — ADULT MULTIVITAMIN W/MINERALS CH
1.0000 | ORAL_TABLET | Freq: Every day | ORAL | Status: DC
  Administered 2022-11-19 – 2022-11-22 (×4): 1 via ORAL
  Filled 2022-11-19 (×4): qty 1

## 2022-11-19 NOTE — Evaluation (Signed)
Occupational Therapy Evaluation Patient Details Name: Heidi Dominguez MRN: [id number] DOB: May 17, 1921 Today's Date: 11/19/2022   History of Present Illness Heidi Dominguez is a 89 y.o. female who returned to [facility] from SNF due to shortness of breath chest pain and hypoxia. Of note pt recently admitted 3/22-4/5 for R shoulder infection. PMHx: arthritis, CHF, CKD, Dyspnea, GERD, HOH, HTN, OA, bilateral shoulder replacements   Clinical Impression   [name] was evaluated s/p the above admission list. She is from SNF and requires assist for all aspecuts at baseline, this therapist is familiar with pt from recent admission. Upon evaluation she was limited by chronic BUE pain and limited ROM, HOH, weakness, new O2 demand, SOB with activity and decreased activity tolerance. Overall she requires mod A for rolling at bed level and declined getting to EOB or OOB. Due to the deficits listed below she also requires up to total A for LB ADLs and max A for UB ADLs - assisted pt with meal at the end of the session. Pt will benefit from continued acute OT services. Pt will benefit from skilled inpatient follow up therapy, <3 hours/day.       Recommendations for follow up therapy are one component of a multi-disciplinary discharge planning process, led by the attending physician.  Recommendations may be updated based on patient status, additional functional criteria and insurance authorization.   Assistance Recommended at Discharge Frequent or constant Supervision/Assistance  Patient can return home with the following A lot of help with walking and/or transfers;Two people to help with walking and/or transfers;A lot of help with bathing/dressing/bathroom;Two people to help with bathing/dressing/bathroom;Assistance with cooking/housework;Assistance with feeding;Direct supervision/assist for medications management;Direct supervision/assist for financial management;Assist for transportation;Help with stairs or ramp for  entrance    Functional Status Assessment  Patient has had a recent decline in their functional status and demonstrates the ability to make significant improvements in function in a reasonable and predictable amount of time.  Equipment Recommendations  None recommended by OT       Precautions / Restrictions Precautions Precautions: Fall Restrictions Weight Bearing Restrictions: No      Mobility Bed Mobility Overal bed mobility: Needs Assistance Bed Mobility: Rolling Rolling: Mod assist              Transfers Overall transfer level: Needs assistance                 General transfer comment: not attempted on evaluation          ADL either performed or assessed with clinical judgement   ADL Overall ADL's : Needs assistance/impaired Eating/Feeding: Minimal assistance;Sitting Eating/Feeding Details (indicate cue type and reason): difficulty chewing - limited by BUE impairments Grooming: Moderate assistance;Sitting   Upper Body Bathing: Maximal assistance;Sitting   Lower Body Bathing: Total assistance   Upper Body Dressing : Maximal assistance   Lower Body Dressing: Total assistance   Toilet Transfer: Total assistance Toilet Transfer Details (indicate cue type and reason): anticipate use of stedy Toileting- Clothing Manipulation and Hygiene: Total assistance       Functional mobility during ADLs: Maximal assistance General ADL Comments: bed level assessment - per last admission, pt was using stedy for transfers     Vision Baseline Vision/History: 0 No visual deficits Vision Assessment?: No apparent visual deficits     Perception Perception Perception Tested?: No   Praxis Praxis Praxis tested?: Within functional limits    Pertinent Vitals/Pain Pain Assessment Pain Assessment: Faces Faces Pain Scale: Hurts a little bit Pain  Location: R shoulder with movement Pain Descriptors / Indicators: Discomfort, Grimacing Pain Intervention(s): Limited  activity within patient's tolerance, Monitored during session     Hand Dominance Right   Extremity/Trunk Assessment Upper Extremity Assessment Upper Extremity Assessment: RUE deficits/detail;LUE deficits/detail RUE Deficits / Details: very minimal AROM. guarding in flexor pattern against body. globally weak. using compensatory techniques for feeding LUE Deficits / Details: more AROM at shoulder than R, still limited due to pain. elbow, wirst and hand ROM are [facility]   Lower Extremity Assessment Lower Extremity Assessment: Defer to PT evaluation   Cervical / Trunk Assessment Cervical / Trunk Assessment: Kyphotic   Communication Communication Communication: HOH   Cognition Arousal/Alertness: Awake/alert Behavior During Therapy: WFL for tasks assessed/performed Overall Cognitive Status: No family/caregiver present to determine baseline cognitive functioning                                 General Comments: able to follow simple commands     General Comments  VSS, denies chest pain            Home Living Family/patient expects to be discharged to:: Skilled nursing facility                                 Additional Comments: LTC, pt states she has been working with PT      Prior Functioning/Environment Prior Level of Function : Needs assist             Mobility Comments: Per pt staff assists with SP transfer and she spends most of the day in the [facility]. Pt reports being active with PT working on standing ADLs Comments: Staff assists with all ADLs        OT Problem List: Decreased strength;Decreased range of motion;Decreased activity tolerance;Impaired balance (sitting and/or standing);Decreased knowledge of use of DME or AE;Decreased knowledge of precautions;Impaired UE functional use;Pain      OT Treatment/Interventions: Self-care/ADL training;Therapeutic exercise;DME and/or AE instruction;Therapeutic activities;Patient/family  education;Balance training    OT Goals(Current goals can be found in the care plan section) Acute Rehab OT Goals Patient Stated Goal: to eat OT Goal Formulation: With patient Time For Goal Achievement: 11/20/22 Potential to Achieve Goals: Good ADL Goals Pt Will Perform Grooming: with set-up;sitting Pt Will Perform Upper Body Dressing: with min assist;sitting Additional ADL Goal #1: Pt will complete bed mobility with min A as a precursor to ADLs Additional ADL Goal #2: Pt will complete OOB transfer with stedy and mod A  OT Frequency: Min 2X/week       AM-PAC OT "6 Clicks" Daily Activity     Outcome Measure Help from another person eating meals?: A Little Help from another person taking care of personal grooming?: A Lot Help from another person toileting, which includes using toliet, bedpan, or urinal?: A Lot Help from another person bathing (including washing, rinsing, drying)?: A Lot Help from another person to put on and taking off regular upper body clothing?: A Lot Help from another person to put on and taking off regular lower body clothing?: Total 6 Click Score: 12   End of Session Equipment Utilized During Treatment: Oxygen Nurse Communication: Mobility status  Activity Tolerance: Patient tolerated treatment well Patient left: in bed;with call bell/phone within reach  OT Visit Diagnosis: Unsteadiness on feet (R26.81);Other abnormalities of gait and mobility (R26.89);Muscle weakness (generalized) (M62.81);Pain Pain - Right/Left:  Right Pain - part of body: Shoulder                Time: [id number] OT Time Calculation (min): 15 min Charges:  OT General Charges $OT Visit: 1 Visit OT Evaluation $OT Eval Moderate Complexity: 1 Mod  Derenda MisSamantha Causey, OTR/L Acute Rehabilitation Services Office [phone] Secure Chat Communication Preferred   Donia PoundsSamantha D Causey 11/19/2022, 12:53 PM

## 2022-11-19 NOTE — Progress Notes (Signed)
  Echocardiogram 2D Echocardiogram has been performed.  Heidi Dominguez 11/19/2022, 9:36 AM

## 2022-11-19 NOTE — Progress Notes (Signed)
Initial Nutrition Assessment  DOCUMENTATION CODES:   Obesity unspecified  INTERVENTION:  - Add Glucerna Shake po TID, each supplement provides 220 kcal and 10 grams of protein  - Add MVI q day.   NUTRITION DIAGNOSIS:  No nutrition diagnosis at this time.   REASON FOR ASSESSMENT:   Consult Assessment of nutrition requirement/status  ASSESSMENT:   89 y.o. female admits related to low oxygen and chest pain. PMH includes: pancreatitis, CHF, CKD, GERD. Pt is currently receiving medical management related to respiratory failure with hypoxia.  Meds reviewed:  lasix, sliding scale insulin, levemir 5 units, prednisone. Labs reviewed: Na low. FS BG: 185-284 mg/dL.   Pt is oriented x3. The pt ate 75% of her breakfast this am. RD will add Glucerna BID for now. Will continue to monitor PO intakes. RD familiar with pt from most recent admission. Pt with good PO intakes last admission. No significant wt loss per record.   NUTRITION - FOCUSED PHYSICAL EXAM:  Remote assessment, attempt at f/u.   Diet Order:   Diet Order             Diet Carb Modified Fluid consistency: Thin; Room service appropriate? Yes  Diet effective now                   EDUCATION NEEDS:      Skin:  Skin Assessment: Reviewed RN Assessment  Last BM:  4/6 - type 6  Height:   Ht Readings from Last 1 Encounters:  11/18/22 5\' 4"  (1.626 m)    Weight:   Wt Readings from Last 1 Encounters:  11/19/22 81.7 kg    Ideal Body Weight:     BMI:  Body mass index is 30.92 kg/m.  Estimated Nutritional Needs:   Kcal:  1730-2160 kcals  Protein:  85-105 gm  Fluid:  >/= 1.7 L  Bethann Humble, RD, LDN, CNSC.

## 2022-11-19 NOTE — Hospital Course (Addendum)
89 y.o.f w/ significant medical history including HFpEF, dysphagia, chronic hypoxic respiratory failure, COPD, normocytic anemia T2DM, GERD with large hiatal hernia,HTN, HLD and recent admission for right shoulder PJI complicated by acute CHF and hyponatremia who was admitted from 3/22-4/5 and discharged to skilled nursing facility seen back from the skilled nursing facility due to shortness of breath chest pain and hypoxia  She was given nitroglycerin and sent back.  In the ED denied chest pain, was complaining of wheezing and coughing more No fever chills vision changes headache, nausea vomiting diarrhea dysuria.  Noted leg swelling but stated that the same as before. In the ED hemodynamically stable, 92% on 2 L nasal cannula labs with hemoglobin 8.8 BNP 281, troponin negative x 2, blood gas with pH 7.4 pCO2 65 pO2 60, mild hyponatremia 134 CBC with WBC 10.0.  Significant studies  CXR: stable mild to moderate severity cardiac silhouette enlargement.Large hiatal hernia and adjacent left basilar atelectasis.  CTA chest: single small branch point arterial embolism in posterior right lower lobe. No other embolus is seen. Prominent pulmonary trunk and main pulmonary arteries with reflux into the IVC and hepatic veins suggesting right heart dysfunction.  Cardiomegaly, increased pericardial effusion, Small layering pleural effusion with adjacent atelectasis or consolidation in lower lobes  Large hiatal hernia with inverted intrathoracic stomach, mild body wall edema.  4.4x3.3cm right thyroid nodule, biopsied in 2017 CT head was integrated, no acute finding Patient was given IV Lasix placed on Eliquis and admitted for further management

## 2022-11-19 NOTE — Progress Notes (Addendum)
PROGRESS NOTE Heidi Dominguez  ZOX:[id number] DOB: 1920/11/05 DOA: 11/17/2022 PCP: Merlene Laughter, MD  Brief Narrative/Hospital Course: 89 y.o.f w/ significant medical history including HFpEF, dysphagia, chronic hypoxic respiratory failure, COPD, normocytic anemia T2DM, GERD with large hiatal hernia,HTN, HLD and recent admission for right shoulder PJI complicated by acute CHF and hyponatremia who was admitted from 3/22-4/5 and discharged to skilled nursing facility seen back from the skilled nursing facility due to shortness of breath chest pain and hypoxia  She was given nitroglycerin and sent back.  In the ED denied chest pain, was complaining of wheezing and coughing more No fever chills vision changes headache, nausea vomiting diarrhea dysuria.  Noted leg swelling but stated that the same as before. In the ED hemodynamically stable, 92% on 2 L nasal cannula labs with hemoglobin 8.8 BNP 281, troponin negative x 2, blood gas with pH 7.4 pCO2 65 pO2 60, mild hyponatremia 134 CBC with WBC 10.0.  Significant studies  CXR: stable mild to moderate severity cardiac silhouette enlargement.Large hiatal hernia and adjacent left basilar atelectasis.  CTA chest: single small branch point arterial embolism in posterior right lower lobe. No other embolus is seen. Prominent pulmonary trunk and main pulmonary arteries with reflux into the IVC and hepatic veins suggesting right heart dysfunction.  Cardiomegaly, increased pericardial effusion, Small layering pleural effusion with adjacent atelectasis or consolidation in lower lobes  Large hiatal hernia with inverted intrathoracic stomach, mild body wall edema.  4.4x3.3cm right thyroid nodule, biopsied in 2017 CT head was integrated, no acute finding Patient was given IV Lasix placed on Eliquis and admitted for further management     Subjective: Seen and examined this morning alert awake has no complaints resting comfortably mild cough noted Also wheezing. On 2  L [place] Afebrile overnight   Assessment and Plan: Principal Problem:   Acute on chronic respiratory failure with hypoxia Active Problems:   Pulmonary embolism   COPD with acute exacerbation   Acute on chronic diastolic CHF (congestive heart failure)   Right shoulder prosthetic joint infection   Severe aortic stenosis   Confusion   Diabetes mellitus type 2, controlled   Essential hypertension   CKD (chronic kidney disease) stage 3, GFR 30-59 ml/min   Gastroesophageal reflux disease   Normocytic anemia   Acute on chronic respiratory failure with hypoxia and hypercapnea Wheezing, concerning for acute COPD exacerbation: Hypoxia and wheezing likely multifactorial with PE, concern for COPD exacerbation, also CHF exacerbation / severe aortic stenosis . She has intrathoracic inverted stoma, attics for aspiration, chronic lung disease.  She has had wheezing on multiple previous examination.currently not short of breath, started on IV Lasix low-dose, anticoagulation and also prednisone which we will continue cautiously, cont Pulmicort flutter valve Mucinex DuoNebs/bronchodilators as needed.Procalcitonin, respiratory virus panel, COVID and flu negative.  Will need PT to evaluation and will ambulate  Acute pulmonary embolism:CT angio noted single small branch point arterial embolism within the posterior right lower lobe.  Of note patient was appropriately on DVT prophylaxis with heparin 5000 units every 8 hours during her hospitalization.  Unclear etiology possibly due to recent hospitalization/decreased mobility.  Eliquis started after discussion with family about AC and fall risk, we will continue loading dose.  Doppler negative for DVT, echo shows EF 60 to 65%, small pericardial effusion unchanged from 3/28, no RWMA   Acute on chronic diastolic CHF exacerbation Severe aortic stenosis Small pericardial effusion stable: Has lower leg edema-but per patient has been there before, no significant JVD  noted,  continue on Lasix 20 mg daily.  Limited echo showed a stable pericardial effusion which is small.Weight stable improving as below-discharge weight 86.5 kg on 6/5 Filed Weights   11/18/22 0935 11/19/22 0538  Weight: 84.1 kg 81.7 kg   Right shoulder prosthetic joint infection with E. Coli: At under gone IR aspiration and evaluation by Ortho and ID not a candidate for operative removal of hardware or debridement, plan was to continue 6 weeks of cefadroxil as outpatient and lifelong suppressive therapy after that.  Switch to oral cefadroxil  Altered mental status reportedly confused in ED-but on admission was oriented-see appears at baseline.  But hard of hearing CT head no acute finding mild hypercapnia on VBG, Delirium precaution  Dysphagia: Recently seen by SLP was not thin liquid diet, PDN twice a day oral care, GI also noted barium swallow was not possible to perform due to patient's level factor EGD was not possible due to risk of sedation and risk of dilatation. GERD Large hiatal hernia with inverted intrathoracic stomach:  She does endorses cough at times while eating, full stomach, we will keep on PPI, not a candidate for surgical intervention.  She has had esophageal narrowing -was seen by [facility] she is at risk for aspiration related lung disease  Type 2 diabetes mellitus A1c 7.4 recently, metformin on hold continue Levemir 5, SSI Recent Labs  Lab 11/18/22 0858 11/18/22 1144 11/18/22 1611 11/18/22 2030 11/19/22 0731  GLUCAP 162* 249* 286* 284* 196*    Essential hypertension>cont  Norvasc at lower dose of 5mg  while on iv lasix, cont to monitor  Normocytic anemia monitor hb. Recent Labs  Lab 11/15/22 0355 11/16/22 0357 11/17/22 0930 11/17/22 2027 11/19/22 0225  HGB 8.8* 8.9* 8.8* 9.0* 8.7*  HCT 27.7* 29.5* 29.3* 30.0* 28.8*    Goals of care: DNR overall prognosis does not appear bright, currently hemodynamically stable.  Seen by palliative care in 2022 it  appears  Class I Obesity:Patient's Body mass index is 30.92 kg/m. : Will benefit with PCP follow-up, weight loss  healthy lifestyle and outpatient sleep evaluation.  Deconditioning/debility: We will get PT OT evaluation. Ambulatory dysfunction: Getting PT OT evaluation.  Patient reports he has not walked in normal.  I came and bedside- patient' daughter at bedside and niece oh phone-I updated and answered all questions discussed that patient will benefit palliative care follow-up TOC consulted to have daughter made the palliative/possibly altering care team, daughter would like to continue with therapy as well as ongoing. I discussed that patient has multiple complex comorbidities and with advanced age, she is high risk.  Per family's request Hemoccult obtained given slowly downtrending hemoglobin, and aspirin discontinued. Asked nurse to help patient with suctioning as patient has been having a lot of coughing as well as tachypnea following her meal, I encouraged on frequent small meals and upright posture postprandial given her large hiatal hernia with inverted intrathoracic stomach.  Given her advancing multiple comorbidities-patient is going to be high risk for readmission/decompensation  DVT prophylaxis: wliquis Code Status:   Code Status: DNR Family Communication: plan of care discussed with patient at bedside. I called her daughter to update but no answer Patient status is:  admitted as observation but remains hospitalized for ongoing  because of respiratory failure with hypoxia Level of care: Telemetry Medical   Dispo: The patient is from: NH            Anticipated disposition: NH1-2 DAYS  Objective: Vitals last 24 hrs: Vitals:   11/19/22  4034 11/19/22 0538 11/19/22 0829 11/19/22 0939  BP:  (!) 126/54  (!) 134/53  Pulse:  89  90  Resp:  (!) 22  18  Temp:  98.4 F (36.9 C)  98.1 F (36.7 C)  TempSrc:    Oral  SpO2: 96% 100% 99% 99%  Weight:  81.7 kg    Height:        Weight change:   Physical Examination: General exam: alert awake, elderly, appears debilitated, not in distress HEENT:Oral mucosa moist, Ear/Nose WNL grossly Respiratory system: bilaterally wheezing present, no use of accessory muscle Cardiovascular system: S1 & S2 +, No JVD. Gastrointestinal system: Abdomen soft,NT,ND, BS+ Nervous System:Alert, awake, moving extremities. Extremities: LE edema ++,distal peripheral pulses palpable.  Skin: No rashes,no icterus. MSK: Normal muscle bulk,tone, power  Medications reviewed:  Scheduled Meds:  amLODipine  10 mg Oral Daily   apixaban  10 mg Oral BID   Followed by   Melene Muller ON 11/25/2022] apixaban  5 mg Oral BID   aspirin EC  162 mg Oral QHS   budesonide  0.5 mg Nebulization BID   dextromethorphan-guaiFENesin  1 tablet Oral BID   furosemide  20 mg Intravenous Daily   insulin aspart  0-9 Units Subcutaneous TID WC   insulin detemir  5 Units Subcutaneous Daily   ipratropium-albuterol  3 mL Nebulization Q6H   loratadine  10 mg Oral QHS   pantoprazole  40 mg Oral Daily   polyvinyl alcohol  2 drop Both Eyes Daily   predniSONE  40 mg Oral Q breakfast   sodium chloride flush  3 mL Intravenous Q12H  Continuous Infusions:  sodium chloride     ceFEPime (MAXIPIME) IV 2 g (11/19/22 0101)    Diet Order             Diet Carb Modified Fluid consistency: Thin; Room service appropriate? Yes  Diet effective now                   Intake/Output Summary (Last 24 hours) at 11/19/2022 1057 Last data filed at 11/19/2022 0900 Gross per 24 hour  Intake 640 ml  Output 2100 ml  Net -1460 ml   Net IO Since Admission: -1,460 mL [11/19/22 1057]  Wt Readings from Last 3 Encounters:  11/19/22 81.7 kg  11/03/22 86.5 kg  10/25/22 78 kg     Unresulted Labs (From admission, onward)     Start     Ordered   11/18/22 1040  Expectorated Sputum Assessment w Gram Stain, Rflx to Resp Cult  (COPD / Pneumonia / Cellulitis / Lower Extremity Wound)  Once,   R         11/18/22 1045          Data Reviewed: I have personally reviewed following labs and imaging studies CBC: Recent Labs  Lab 11/15/22 0355 11/16/22 0357 11/17/22 0930 11/17/22 2027 11/19/22 0225  WBC 10.7* 9.3 7.3 10.1 8.2  NEUTROABS 8.7*  --   --   --   --   HGB 8.8* 8.9* 8.8* 9.0* 8.7*  HCT 27.7* 29.5* 29.3* 30.0* 28.8*  MCV 86.6 88.9 90.7 89.8 88.1  PLT 343 364 373 382 375   Basic Metabolic Panel: Recent Labs  Lab 11/14/22 0313 11/15/22 0355 11/16/22 0357 11/17/22 0930 11/17/22 2027 11/18/22 1429 11/19/22 0225  NA 128* 128* 132* 134* 134*  --  130*  K 4.4 4.4 4.3 4.5 4.7  --  4.2  CL 86* 86* 86* 87* 87*  --  81*  CO2 32 34* 35* 38* 37*  --  40*  GLUCOSE 167* 158* 113* 187* 165*  --  245*  BUN 23 23 20 16 15   --  9  CREATININE 0.77 0.80 0.68 0.55 0.59  --  0.52  CALCIUM 9.2 9.2 9.4 9.3 9.5  --  9.3  MG  --   --   --   --   --  2.0  --   PHOS 2.8 2.8  --   --   --   --   --    GFR: Estimated Creatinine Clearance: 39.6 mL/min (by C-G formula based on SCr of 0.52 mg/dL). Liver Function Tests: Recent Labs  Lab 11/14/22 0313 11/15/22 0355 11/16/22 0357 11/17/22 2027  AST  --   --  19 17  ALT  --   --  21 16  ALKPHOS  --   --  62 63  BILITOT  --   --  0.5 0.5  PROT  --   --  6.1* 6.5  ALBUMIN 2.3* 2.2* 2.1* 2.2*   Recent Labs  Lab 11/17/22 2027  LIPASE 20   Recent Labs  Lab 11/15/22 0355  AMMONIA 30  Coagulation Profile: Recent Labs  Lab 11/19/22 0225  INR 1.2  BNP (last 3 results) Recent Labs    09/27/22 1218  PROBNP 77.0  HbA1C: No results for input(s): "HGBA1C" in the last 72 hours. Recent Labs  Lab 11/18/22 1429 11/19/22 0225  PROCALCITON <0.10 <0.10    Recent Results (from the past 240 hour(s))  Resp panel by RT-PCR (RSV, Flu A&B, Covid) Urine, Catheterized     Status: None   Collection Time: 11/17/22 11:56 PM   Specimen: Urine, Catheterized; Nasal Swab  Result Value Ref Range Status   SARS Coronavirus 2 by RT PCR NEGATIVE  NEGATIVE Final   Influenza A by PCR NEGATIVE NEGATIVE Final   Influenza B by PCR NEGATIVE NEGATIVE Final    Comment: (NOTE) The Xpert Xpress SARS-CoV-2/FLU/RSV plus assay is intended as an aid in the diagnosis of influenza from Nasopharyngeal swab specimens and should not be used as a sole basis for treatment. Nasal washings and aspirates are unacceptable for Xpert Xpress SARS-CoV-2/FLU/RSV testing.  Fact Sheet for Patients: BloggerCourse.com  Fact Sheet for Healthcare Providers: SeriousBroker.it  This test is not yet approved or cleared by the [place] FDA and has been authorized for detection and/or diagnosis of SARS-CoV-2 by FDA under an Emergency Use Authorization (EUA). This EUA will remain in effect (meaning this test can be used) for the duration of the COVID-19 declaration under Section 564(b)(1) of the Act, 21 U.S.C. section 360bbb-3(b)(1), unless the authorization is terminated or revoked.     Resp Syncytial Virus by PCR NEGATIVE NEGATIVE Final    Comment: (NOTE) Fact Sheet for Patients: BloggerCourse.com  Fact Sheet for Healthcare Providers: SeriousBroker.it  This test is not yet approved or cleared by the [place] FDA and has been authorized for detection and/or diagnosis of SARS-CoV-2 by FDA under an Emergency Use Authorization (EUA). This EUA will remain in effect (meaning this test can be used) for the duration of the COVID-19 declaration under Section 564(b)(1) of the Act, 21 U.S.C. section 360bbb-3(b)(1), unless the authorization is terminated or revoked.  Performed at [facility], [address], [place], [place] [address]   Respiratory (~20 pathogens) panel by PCR     Status: None   Collection Time: 11/18/22 10:40 AM   Specimen: Nasopharyngeal Swab; Respiratory  Result Value Ref Range Status   Adenovirus NOT DETECTED NOT DETECTED Final    Coronavirus 229E NOT DETECTED NOT DETECTED Final    Comment: (NOTE) The Coronavirus on the Respiratory Panel, DOES NOT test for the novel  Coronavirus (2019 nCoV)    Coronavirus HKU1 NOT DETECTED NOT DETECTED Final   Coronavirus NL63 NOT DETECTED NOT DETECTED Final   Coronavirus OC43 NOT DETECTED NOT DETECTED Final   Metapneumovirus NOT DETECTED NOT DETECTED Final   Rhinovirus / Enterovirus NOT DETECTED NOT DETECTED Final   Influenza A NOT DETECTED NOT DETECTED Final   Influenza B NOT DETECTED NOT DETECTED Final   Parainfluenza Virus 1 NOT DETECTED NOT DETECTED Final   Parainfluenza Virus 2 NOT DETECTED NOT DETECTED Final   Parainfluenza Virus 3 NOT DETECTED NOT DETECTED Final   Parainfluenza Virus 4 NOT DETECTED NOT DETECTED Final   Respiratory Syncytial Virus NOT DETECTED NOT DETECTED Final   Bordetella pertussis NOT DETECTED NOT DETECTED Final   Bordetella Parapertussis NOT DETECTED NOT DETECTED Final   Chlamydophila pneumoniae NOT DETECTED NOT DETECTED Final   Mycoplasma pneumoniae NOT DETECTED NOT DETECTED Final    Comment: Performed at [facility], [address], [place], [place] [address]    Antimicrobials: Anti-infectives (From admission, onward)    Start     Dose/Rate Route Frequency Ordered Stop   11/18/22 1200  ceFEPIme (MAXIPIME) 2 g in sodium chloride 0.9 % 100 mL IVPB        2 g 200 mL/hr over 30 Minutes Intravenous Every 12 hours 11/18/22 1045 11/23/22 1159      Culture/Microbiology    Component Value Date/Time   SDES ABSCESS 11/04/2022 1243   SPECREQUEST DRAIN 2 RIGHT ANTERIOR SHOULDER 11/04/2022 1243   CULT  11/04/2022 1243    FEW ESCHERICHIA COLI NO ANAEROBES ISOLATED Performed at [facility], [address], [place], [place] [address]    REPTSTATUS 11/17/2022 FINAL 11/04/2022 1243  Other culture-see note  Radiology Studies: ECHOCARDIOGRAM LIMITED  Result Date: 11/19/2022    ECHOCARDIOGRAM LIMITED REPORT   Patient Name:   Heidi Dominguez Date of Exam: 11/19/2022 Medical Rec #:  [id number]       Height:       64.0 in Accession #:    [id number]      Weight:       180.1 lb Date of Birth:  1921/05/10       BSA:          1.871 m Patient Age:    89 years        BP:           126/54 mmHg Patient Gender: F               HR:           96 bpm. Exam Location:  Inpatient Procedure: Limited Echo, Limited Color Doppler and Cardiac Doppler Indications:    Pericardial effusion I31.3  History:        Patient has prior history of Echocardiogram examinations, most                 recent 11/09/2022. P.E., Aortic Valve Disease and Mitral Valve                 Disease; Risk Factors:Hypertension, Diabetes, Non-Smoker and                 Dyslipidemia.  Sonographer:    Aron Baba Referring Phys: [id number] Revonda Standard  WOLFE  Sonographer Comments: Image acquisition challenging due to patient body habitus and Image acquisition challenging due to respiratory motion. IMPRESSIONS  1. Limted study for pericardial effusion; full doppler not performed; pericardial effusion remains small and unchanged compared to 11/09/22.  2. Left ventricular ejection fraction, by estimation, is 60 to 65%. The left ventricle has normal function. The left ventricle has no regional wall motion abnormalities. There is moderate concentric left ventricular hypertrophy.  3. Right ventricular systolic function is mildly reduced. The right ventricular size is mildly enlarged.  4. Left atrial size was severely dilated.  5. A small pericardial effusion is present.  6. Mild mitral valve regurgitation. Severe mitral annular calcification. FINDINGS  Left Ventricle: Left ventricular ejection fraction, by estimation, is 60 to 65%. The left ventricle has normal function. The left ventricle has no regional wall motion abnormalities. The left ventricular internal cavity size was normal in size. There is  moderate concentric left ventricular hypertrophy. Right Ventricle: The right ventricular size is mildly enlarged.  Right ventricular systolic function is mildly reduced. Left Atrium: Left atrial size was severely dilated. Right Atrium: Right atrial size was normal in size. Pericardium: A small pericardial effusion is present. Mitral Valve: Severe mitral annular calcification. Mild mitral valve regurgitation. Tricuspid Valve: The tricuspid valve is normal in structure. Tricuspid valve regurgitation is mild. IAS/Shunts: No atrial level shunt detected by color flow Doppler. Additional Comments: Limted study for pericardial effusion; full doppler not performed; pericardial effusion remains small and unchanged compared to 11/09/22. Spectral Doppler performed. Color Doppler performed.  LEFT VENTRICLE PLAX 2D LVIDd:         3.60 cm LVIDs:         2.60 cm LV PW:         1.50 cm LV IVS:        1.20 cm  Olga Millers MD Electronically signed by Olga Millers MD Signature Date/Time: 11/19/2022/10:07:47 AM    Final    VAS [place] LOWER EXTREMITY VENOUS (DVT)  Result Date: 11/18/2022  Lower Venous DVT Study Patient Name:  Heidi Dominguez  Date of Exam:   11/18/2022 Medical Rec #: [id number]        Accession #:    [id number] Date of Birth: 1920/11/05        Patient Gender: F Patient Age:   15 years Exam Location:  [facility] Procedure:      VAS [place] LOWER EXTREMITY VENOUS (DVT) Referring Phys: Orland Mustard --------------------------------------------------------------------------------  Indications: Pulmonary embolism, and Edema.  Limitations: Body habitus, poor ultrasound/tissue interface and edema, pain with compression. Comparison Study: Prior negative bilateral LEV done 03/16/2021 Performing Technologist: Sherren Kerns RVS  Examination Guidelines: A complete evaluation includes B-mode imaging, spectral Doppler, color Doppler, and power Doppler as needed of all accessible portions of each vessel. Bilateral testing is considered an integral part of a complete examination. Limited examinations for reoccurring indications may be performed  as noted. The reflux portion of the exam is performed with the patient in reverse Trendelenburg.  +---------+---------------+---------+-----------+----------+-------------------+ RIGHT    CompressibilityPhasicitySpontaneityPropertiesThrombus Aging      +---------+---------------+---------+-----------+----------+-------------------+ CFV      Full           Yes      Yes                                      +---------+---------------+---------+-----------+----------+-------------------+ SFJ      Full                                                             +---------+---------------+---------+-----------+----------+-------------------+  FV Prox  Full                                                             +---------+---------------+---------+-----------+----------+-------------------+ FV Mid                  Yes      Yes                  patent by color and                                                       Doppler             +---------+---------------+---------+-----------+----------+-------------------+ FV DistalFull           Yes      Yes                                      +---------+---------------+---------+-----------+----------+-------------------+ PFV      Full                                                             +---------+---------------+---------+-----------+----------+-------------------+ POP                                                   Not visualized      +---------+---------------+---------+-----------+----------+-------------------+ PTV                                                   patent by color and                                                       Doppler             +---------+---------------+---------+-----------+----------+-------------------+ PERO                                                  Not well visualized  +---------+---------------+---------+-----------+----------+-------------------+   +---------+---------------+---------+-----------+----------+-------------------+ LEFT     CompressibilityPhasicitySpontaneityPropertiesThrombus Aging      +---------+---------------+---------+-----------+----------+-------------------+ CFV      Full           Yes      Yes                                      +---------+---------------+---------+-----------+----------+-------------------+  SFJ      Full                                                             +---------+---------------+---------+-----------+----------+-------------------+ FV Prox  Full                                                             +---------+---------------+---------+-----------+----------+-------------------+ FV Mid   Full           Yes      Yes                                      +---------+---------------+---------+-----------+----------+-------------------+ FV DistalFull                                                             +---------+---------------+---------+-----------+----------+-------------------+ PFV      Full                                                             +---------+---------------+---------+-----------+----------+-------------------+ POP                     Yes      Yes                  patent by color and                                                       Doppler             +---------+---------------+---------+-----------+----------+-------------------+ PTV                                                   Not well visualized +---------+---------------+---------+-----------+----------+-------------------+ PERO                                                  Not well visualized +---------+---------------+---------+-----------+----------+-------------------+     Summary: RIGHT: - There is no evidence of deep vein thrombosis in the lower  extremity. However, portions of this examination were limited- see technologist comments above.  LEFT: - There is no evidence of deep vein thrombosis in the lower extremity. However, portions of this  examination were limited- see technologist comments above.  *See table(s) above for measurements and observations.    Preliminary    CT HEAD WO CONTRAST ( )  Result Date: 11/18/2022 CLINICAL DATA:  Provided history: Mental status change, unknown cause. EXAM: CT HEAD WITHOUT CONTRAST TECHNIQUE: Contiguous axial images were obtained from the base of the skull through the vertex without intravenous contrast. RADIATION DOSE REDUCTION: This exam was performed according to the departmental dose-optimization program which includes automated exposure control, adjustment of the mA and/or kV according to patient size and/or use of iterative reconstruction technique. COMPARISON:  Head CT 11/02/2019. FINDINGS: Motion degraded examination, limiting evaluation. Brain: Generalized cerebral atrophy. Patchy and ill-defined hypoattenuation within the cerebral white matter, nonspecific but compatible with advanced chronic small vessel ischemic disease. Within limitations of motion degradation, no acute intracranial hemorrhage, acute infarct, extra-axial fluid collection or intracranial mass is identified. No midline shift. Vascular: No hyperdense vessel. Atherosclerotic calcifications. Skull: No fracture or aggressive osseous lesion. Sinuses/Orbits: No mass or acute finding within the imaged orbits. No significant paranasal sinus disease at the imaged levels. IMPRESSION: 1. Significantly motion degraded examination. 2. Within this limitation, no acute intracranial abnormality is identified. 3. Parenchymal atrophy and chronic small vessel ischemic disease. Electronically Signed   By: Jackey Loge D.O.   On: 11/18/2022 16:38   CT Angio Chest PE W and/or Wo Contrast  Result Date: 11/18/2022 CLINICAL DATA:  Suspected pulmonary  embolism.  High probability. EXAM: CT ANGIOGRAPHY CHEST WITH CONTRAST TECHNIQUE: Multidetector CT imaging of the chest was performed using the standard protocol during bolus administration of intravenous contrast. Multiplanar CT image reconstructions and MIPs were obtained to evaluate the vascular anatomy. RADIATION DOSE REDUCTION: This exam was performed according to the departmental dose-optimization program which includes automated exposure control, adjustment of the mA and/or kV according to patient size and/or use of iterative reconstruction technique. CONTRAST:  50mL OMNIPAQUE IOHEXOL 350 MG/ML SOLN COMPARISON:  Portable chest yesterday, portable chest 11/16/2022 and 11/13/2022, CTA chest 03/16/2021 FINDINGS: Cardiovascular: The pulmonary trunk is prominent measuring 3.6 cm indicating arterial hypertension, previously 3.2 cm. Both main pulmonary arteries approaching 3 cm. There is a single small branch point embolism noted in the posterior basal right lower lobe on 6:85. No other embolus is seen, but small-vessel emboli are difficult to exclude given atelectasis/consolidation in both lower lobes associated with the effusions. The mitral ring, coronary arteries and aorta are heavily calcified. There is aortic tortuosity. There are patchy calcifications along the aortic valve leaflets. There are scattered calcifications of the great vessels. There is no aortic or great vessel aneurysm, stenosis or dissection. Moderate panchamber cardiomegaly has increased in the interval as well as a circumferential pericardial effusion which now measures 1.8 cm thickness. The pulmonary veins are also prominent. There is engorgement in the left atrial appendage. Reflux seen into the IVC and hepatic veins may suggest right heart dysfunction but there is no disproportionate right heart chamber enlargement. Mediastinum/Nodes: Hypodense right thyroid nodule is not as well seen today due to extensive metallic artifact from bilateral  shoulder replacements. This has been previously biopsied in 2017. Estimated current measurements 4.4 x 3.3 cm, previously 4.0 x 3.5 cm The nodule again displaces the trachea to the left does not efface the tracheal lumen. Streak artifact from the shoulder replacements continues over the mediastinum. No adenopathy is seen through the streak artifacts. There is a large hiatal hernia with inverted intrathoracic stomach. The esophagus is patulous and otherwise unremarkable. Thoracic trachea is clear. Lungs/Pleura: There  is no overt pulmonary edema. There are bilateral small layering pleural effusions with adjacent atelectasis or consolidation in the lower lobes. There is chronic linear scarring in the anterior lung apices. There is increased atelectasis in the lingular base. There is no pneumothorax.  The lungs are otherwise clear. Upper Abdomen: No acute abnormality. Musculoskeletal: Osteopenia and degenerative change thoracic spine. No acute or significant osseous findings. There is mild edema in the body wall. Review of the MIP images confirms the above findings. IMPRESSION: 1. Single small branch point arterial embolism in the posterior basal right lower lobe. No other embolus is seen, but small-vessel emboli are difficult to exclude. 2. Prominent pulmonary trunk and main pulmonary arteries, with reflux into the IVC and hepatic veins which may suggest right heart dysfunction. No disproportionate right heart chamber enlargement. 3. Cardiomegaly, interval increased pericardial effusion, and prominent pulmonary veins, without overt pulmonary edema. 4. Small layering pleural effusions with adjacent atelectasis or consolidation in the lower lobes. 5. Aortic and coronary artery atherosclerosis. 6. Large hiatal hernia with inverted intrathoracic stomach. 7. Mild body wall edema. 8. 4.4 x 3.3 cm right thyroid nodule, previously biopsied in 2017. 9. Osteopenia and degenerative change. Electronically Signed   By: Almira Bar M.D.   On: 11/18/2022 05:15   DG Chest Port 1 View  Result Date: 11/17/2022 CLINICAL DATA:  Chest pain. EXAM: PORTABLE CHEST 1 VIEW COMPARISON:  November 16, 2022 FINDINGS: There is stable mild to moderate severity cardiac silhouette enlargement. Marked severity calcification and tortuosity of the thoracic aorta is seen. Low lung volumes are noted with a stable appearing area of left basilar opacification. There is mild prominence of the perihilar pulmonary vasculature. No pneumothorax is identified. Bilateral shoulder replacements are noted. IMPRESSION: 1. Stable mild to moderate severity cardiac silhouette enlargement. 2. Low lung volumes with stable findings consistent with a known large hiatal hernia and adjacent left basilar atelectasis. Electronically Signed   By: Aram Candela M.D.   On: 11/17/2022 23:29     LOS: 0 days   Lanae Boast, MD [facility]  11/19/2022, 10:57 AM

## 2022-11-20 ENCOUNTER — Other Ambulatory Visit (HOSPITAL_COMMUNITY): Payer: Self-pay

## 2022-11-20 DIAGNOSIS — J9621 Acute and chronic respiratory failure with hypoxia: Secondary | ICD-10-CM | POA: Diagnosis not present

## 2022-11-20 LAB — GLUCOSE, CAPILLARY
Glucose-Capillary: 152 mg/dL — ABNORMAL HIGH (ref 70–99)
Glucose-Capillary: 218 mg/dL — ABNORMAL HIGH (ref 70–99)
Glucose-Capillary: 357 mg/dL — ABNORMAL HIGH (ref 70–99)

## 2022-11-20 LAB — BASIC METABOLIC PANEL
Anion gap: 7 (ref 5–15)
BUN: 9 mg/dL (ref 8–23)
CO2: 38 mmol/L — ABNORMAL HIGH (ref 22–32)
Calcium: 9 mg/dL (ref 8.9–10.3)
Chloride: 83 mmol/L — ABNORMAL LOW (ref 98–111)
Creatinine, Ser: 0.54 mg/dL (ref 0.44–1.00)
GFR, Estimated: >60 mL/min (ref >60)
Glucose, Bld: 201 mg/dL — ABNORMAL HIGH (ref 70–99)
Potassium: 4.3 mmol/L (ref 3.5–5.1)
Sodium: 128 mmol/L — ABNORMAL LOW (ref 135–145)

## 2022-11-20 LAB — CBC
HCT: 28.7 % — ABNORMAL LOW (ref 36.0–46.0)
Hemoglobin: 8.7 g/dL — ABNORMAL LOW (ref 12.0–15.0)
MCH: 26.8 pg (ref 26.0–34.0)
MCHC: 30.3 g/dL (ref 30.0–36.0)
MCV: 88.3 fL (ref 80.0–100.0)
Platelets: 357 10*3/uL (ref 150–400)
RBC: 3.25 MIL/uL — ABNORMAL LOW (ref 3.87–5.11)
RDW: 16.7 % — ABNORMAL HIGH (ref 11.5–15.5)
WBC: 10.2 10*3/uL (ref 4.0–10.5)
nRBC: 0 % (ref 0.0–0.2)

## 2022-11-20 NOTE — TOC Progression Note (Addendum)
Transition of Care [facility]) - Initial/Assessment Note    Patient Details  Name: Heidi Dominguez MRN: [id number] Date of Birth: [birth year]  Transition of Care [facility]) CM/SW Contact:    Ralene Bathe, LCSWA Phone Number: 11/20/2022, 12:44 PM  Clinical Narrative:                 LCSW contacted [facility]. The patient is long term at the facility and can return when medically ready.   It is requested that insurance Berkley Harvey is initiated prior to patient returning to facility.  LCSW received a call from the patient's daughter and discussed disposition.  LCSW requested insurance authorization at the request of the patient's daughter.  TOC following.         Patient Goals and CMS Choice            Expected Discharge Plan and Services                                              Prior Living Arrangements/Services                       Activities of Daily Living      Permission Sought/Granted                  Emotional Assessment              Admission diagnosis:  Hiatal hernia [K44.9] Precordial chest pain [R07.2] Hypoalbuminemia [E88.09] Pulmonary embolism [I26.99] Chronic anemia [D64.9] History of vomiting [Z87.898] Acute pulmonary embolism [I26.99] Patient Active Problem List   Diagnosis Date Noted   Acute pulmonary embolism 11/19/2022   Pulmonary embolism 11/18/2022   Severe aortic stenosis 11/18/2022   Confusion 11/18/2022   Acute on chronic diastolic CHF (congestive heart failure) 11/15/2022   Normocytic anemia 11/15/2022   Obesity (BMI 30-39.9) 11/15/2022   Acute on chronic respiratory failure with hypoxia 09/28/2022   Bilateral lower extremity edema 09/28/2022   COPD with acute exacerbation 09/27/2022   Osteomyelitis 01/12/2021   Peripheral artery disease 12/22/2020   Diabetic foot ulcer 09/02/2020   Diabetic neuropathy 09/02/2020   Gait abnormality 09/02/2020   Diabetic retinopathy associated with type 2 diabetes mellitus  09/02/2020   Hardening of the aorta (main artery of the heart) 09/02/2020   Thrombocytopenia 09/02/2020   Gastroesophageal reflux disease 10/22/2019   Unspecified chronic bronchitis 09/02/2019   Rhinitis, chronic 01/07/2019   Right shoulder prosthetic joint infection 11/08/2018   History of hemiarthroplasty of right shoulder 09/25/2018   Acute pancreatitis 08/28/2018   Acute recurrent pancreatitis    Abdominal pain 08/23/2018   Elevated LFTs 07/04/2018   CKD (chronic kidney disease) stage 3, GFR 30-59 ml/min 07/04/2018   Upper airway cough syndrome 03/13/2018   Sensorineural hearing loss (SNHL), bilateral 12/04/2016   Post-nasal drainage 03/14/2016   Presbycusis of both ears 03/14/2016   Bilateral hearing loss 10/06/2015   Bilateral impacted cerumen 10/06/2015   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   Subjective tinnitus of both ears 10/06/2015   Choledocholithiasis with chronic cholecystitis, nonobstructing 04/11/2015   Pre-operative cardiovascular examination, high risk surgery    Epigastric abdominal pain 04/08/2015   Acute gallstone pancreatitis s/p lap chole w IOC 04/11/2015 04/08/2015   Diabetes mellitus type 2, controlled 04/08/2015   Gallstone pancreatitis 04/08/2015   Mitral regurgitation 04/08/2015   Chronic diastolic  CHF (congestive heart failure) 04/08/2015   DOE (dyspnea on exertion) 03/31/2015   Ischemic colitis (HCC) 08/09/2012   Colitis 08/07/2012   Rectal bleeding 08/07/2012   Weakness 09/24/2011   Bradycardia 09/24/2011   Acute lower UTI 09/24/2011   Hematochezia 09/24/2011   Essential hypertension 09/24/2011   PCP:  Merlene Laughter, MD Pharmacy:   [facility] [address] [place] - [address] [place] [address] Phone: [phone] Fax: [phone]  [id number] - [place], [place] - [address] [place] [address] Phone: [phone] Fax:  [phone]     Social Determinants of Health (SDOH) Social History: SDOH Screenings   Food Insecurity: No Food Insecurity (11/03/2022)  Housing: Low Risk  (11/03/2022)  Transportation Needs: No Transportation Needs (11/03/2022)  Utilities: Not At Risk (11/03/2022)  Depression (PHQ2-9): Low Risk  (02/09/2021)  Tobacco Use: Low Risk  (11/04/2022)   SDOH Interventions:     Readmission Risk Interventions     No data to display

## 2022-11-20 NOTE — Inpatient Diabetes Management (Addendum)
Inpatient Diabetes Program Recommendations  AACE/ADA: New Consensus Statement on Inpatient Glycemic Control (2015)  Target Ranges:  Prepandial:   less than 140 mg/dL      Peak postprandial:   less than 180 mg/dL (1-2 hours)      Critically ill patients:  140 - 180 mg/dL   Lab Results  Component Value Date   GLUCAP 152 (H) 11/20/2022   HGBA1C 7.5 (H) 10/02/2022    Review of Glycemic Control  Latest Reference Range & Units 11/19/22 07:31 11/19/22 11:22 11/19/22 16:30 11/19/22 21:48 11/20/22 07:18  Glucose-Capillary 70 - 99 mg/dL 314 (H) 970 (H) 263 (H) 228 (H) 152 (H)   Diabetes history: DM 2 Outpatient Diabetes medications: Levemir 5 units Daily, Humalog 0-12 units tid, Metformin 500 mg bid Current orders for Inpatient glycemic control:  Levemir 5 units Daily Novolog 0-9 units tid  PO prednisone 40 mg Daily Glucerna bid between meals  Inpatient Diabetes Program Recommendations:    Note: Glucose trends increase after PO/supplement intake and prednisone dose  -   consider adding Novolog 2 units tid meal coverage if eating >50% of meals.  Thanks,  Christena Deem RN, MSN, BC-ADM Inpatient Diabetes Coordinator Team Pager [phone] (8a-5p)

## 2022-11-20 NOTE — NC FL2 (Signed)
[place] MEDICAID FL2 LEVEL OF CARE FORM     IDENTIFICATION  Patient Name: Heidi Dominguez Birthdate: [birth year] Sex: female Admission Date (Current Location): 11/17/2022  [facility] and [place] Number:  Producer, television/film/video and Address:  [facility], [address], [place], [place] [address]      Provider Number: [id number]  Attending Physician Name and Address:  Lanae Boast, MD  Relative Name and Phone Number:  Schnackenberg,[name] (Daughter) [phone]    Current Level of Care: Hospital Recommended Level of Care: Skilled Nursing Facility Prior Approval Number:    Date Approved/Denied:   PASRR Number: [id number]  Discharge Plan: SNF    Current Diagnoses: Patient Active Problem List   Diagnosis Date Noted   Acute pulmonary embolism 11/19/2022   Pulmonary embolism 11/18/2022   Severe aortic stenosis 11/18/2022   Confusion 11/18/2022   Acute on chronic diastolic CHF (congestive heart failure) 11/15/2022   Normocytic anemia 11/15/2022   Obesity (BMI 30-39.9) 11/15/2022   Acute on chronic respiratory failure with hypoxia 09/28/2022   Bilateral lower extremity edema 09/28/2022   COPD with acute exacerbation 09/27/2022   Osteomyelitis 01/12/2021   Peripheral artery disease 12/22/2020   Diabetic foot ulcer 09/02/2020   Diabetic neuropathy 09/02/2020   Gait abnormality 09/02/2020   Diabetic retinopathy associated with type 2 diabetes mellitus 09/02/2020   Hardening of the aorta (main artery of the heart) 09/02/2020   Thrombocytopenia 09/02/2020   Gastroesophageal reflux disease 10/22/2019   Unspecified chronic bronchitis 09/02/2019   Rhinitis, chronic 01/07/2019   Right shoulder prosthetic joint infection 11/08/2018   History of hemiarthroplasty of right shoulder 09/25/2018   Acute pancreatitis 08/28/2018   Acute recurrent pancreatitis    Abdominal pain 08/23/2018   Elevated LFTs 07/04/2018   CKD (chronic kidney disease) stage 3, GFR 30-59  ml/min 07/04/2018   Upper airway cough syndrome 03/13/2018   Sensorineural hearing loss (SNHL), bilateral 12/04/2016   Post-nasal drainage 03/14/2016   Presbycusis of both ears 03/14/2016   Bilateral hearing loss 10/06/2015   Bilateral impacted cerumen 10/06/2015   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   Subjective tinnitus of both ears 10/06/2015   Choledocholithiasis with chronic cholecystitis, nonobstructing 04/11/2015   Pre-operative cardiovascular examination, high risk surgery    Epigastric abdominal pain 04/08/2015   Acute gallstone pancreatitis s/p lap chole w IOC 04/11/2015 04/08/2015   Diabetes mellitus type 2, controlled 04/08/2015   Gallstone pancreatitis 04/08/2015   Mitral regurgitation 04/08/2015   Chronic diastolic CHF (congestive heart failure) 04/08/2015   DOE (dyspnea on exertion) 03/31/2015   Ischemic colitis (HCC) 08/09/2012   Colitis 08/07/2012   Rectal bleeding 08/07/2012   Weakness 09/24/2011   Bradycardia 09/24/2011   Acute lower UTI 09/24/2011   Hematochezia 09/24/2011   Essential hypertension 09/24/2011    Orientation RESPIRATION BLADDER Height & Weight     Self, Situation, Place  O2 (2L) Incontinent Weight: 180 lb 1.9 oz (81.7 kg) Height:  5\' 4"  (162.6 cm)  BEHAVIORAL SYMPTOMS/MOOD NEUROLOGICAL BOWEL NUTRITION STATUS      Incontinent Diet (see d/c summary)  AMBULATORY STATUS COMMUNICATION OF NEEDS Skin   Total Care Verbally Normal                       Personal Care Assistance Level of Assistance  Bathing, Dressing, Feeding Bathing Assistance: Maximum assistance Feeding assistance: Limited assistance Dressing Assistance: Maximum assistance     Functional Limitations Info  Sight, Hearing, Speech Sight Info: Adequate  Hearing Info: Impaired Speech Info: Adequate    SPECIAL CARE FACTORS FREQUENCY  PT (By licensed PT), OT (By licensed OT)     PT Frequency: 5x/ week OT Frequency: 5x/ week            Contractures  Contractures Info: Not present    Additional Factors Info  Code Status, Allergies, Insulin Sliding Scale Code Status Info: DNR Allergies Info: Augmentin (Amoxicillin-pot Clavulanate)  Bactrim (Sulfamethoxazole-trimethoprim)  Calan (Verapamil)  Catapres (Clonidine Hcl)  Codeine  Hydrocodone  Other  Sulfa Antibiotics  Zestril (Lisinopril)  Azulfidine (Sulfasalazine)   Insulin Sliding Scale Info: see d/c med list       Current Medications (11/20/2022):  This is the current hospital active medication list Current Facility-Administered Medications  Medication Dose Route Frequency Provider Last Rate Last Admin   0.9 %  sodium chloride infusion  250 mL Intravenous PRN Orland MustardWolfe, Allison, MD       acetaminophen (TYLENOL) tablet 650 mg  650 mg Oral Q6H PRN Orland MustardWolfe, Allison, MD       Or   acetaminophen (TYLENOL) suppository 650 mg  650 mg Rectal Q6H PRN Orland MustardWolfe, Allison, MD       albuterol (PROVENTIL) (2.5 MG/3ML) 0.083% nebulizer solution 2.5 mg  2.5 mg Nebulization Q2H PRN Orland MustardWolfe, Allison, MD   2.5 mg at 11/20/22 1259   amLODipine (NORVASC) tablet 5 mg  5 mg Oral Daily Kc, Ramesh, MD   5 mg at 11/20/22 1033   apixaban (ELIQUIS) tablet 10 mg  10 mg Oral BID Briscoe Deutscherpyd, Timothy S, MD   10 mg at 11/20/22 1033   Followed by   Melene Muller[START ON 11/25/2022] apixaban (ELIQUIS) tablet 5 mg  5 mg Oral BID Opyd, Lavone Neriimothy S, MD       budesonide (PULMICORT) nebulizer solution 0.5 mg  0.5 mg Nebulization BID Orland MustardWolfe, Allison, MD   0.5 mg at 11/20/22 0815   cefadroxil (DURICEF) capsule 1,000 mg  1,000 mg Oral BID Kc, Dayna Barkeramesh, MD   1,000 mg at 11/20/22 1032   Followed by   Melene Muller[START ON 12/16/2022] cefadroxil (DURICEF) capsule 500 mg  500 mg Oral BID Kc, Ramesh, MD       dextromethorphan-guaiFENesin (MUCINEX DM) 30-600 MG per 12 hr tablet 1 tablet  1 tablet Oral BID Orland MustardWolfe, Allison, MD   1 tablet at 11/20/22 1034   feeding supplement (GLUCERNA SHAKE) (GLUCERNA SHAKE) liquid 237 mL  237 mL Oral BID BM Kc, Ramesh, MD   237 mL at 11/20/22 1039    furosemide (LASIX) injection 20 mg  20 mg Intravenous Daily Orland MustardWolfe, Allison, MD   20 mg at 11/20/22 1034   insulin aspart (novoLOG) injection 0-9 Units  0-9 Units Subcutaneous TID WC Orland MustardWolfe, Allison, MD   3 Units at 11/20/22 1255   insulin detemir (LEVEMIR) injection 5 Units  5 Units Subcutaneous Daily Orland MustardWolfe, Allison, MD   5 Units at 11/20/22 1034   loratadine (CLARITIN) tablet 10 mg  10 mg Oral QHS Orland MustardWolfe, Allison, MD   10 mg at 11/19/22 2217   melatonin tablet 5 mg  5 mg Oral QHS PRN Gery Prayrosley, Debby, MD   5 mg at 11/19/22 2322   multivitamin with minerals tablet 1 tablet  1 tablet Oral Daily Lanae BoastKc, Ramesh, MD   1 tablet at 11/20/22 1033   Oral care mouth rinse  15 mL Mouth Rinse PRN Orland MustardWolfe, Allison, MD       pantoprazole (PROTONIX) EC tablet 40 mg  40 mg Oral Daily Orland MustardWolfe, Allison, MD  40 mg at 11/20/22 1034   polyethylene glycol (MIRALAX / GLYCOLAX) packet 17 g  17 g Oral QHS PRN Orland Mustard, MD       polyvinyl alcohol (LIQUIFILM TEARS) 1.4 % ophthalmic solution 2 drop  2 drop Both Eyes Daily Orland Mustard, MD   2 drop at 11/20/22 1035   predniSONE (DELTASONE) tablet 40 mg  40 mg Oral Q breakfast Orland Mustard, MD   40 mg at 11/20/22 0815   sodium chloride flush (NS) 0.9 % injection 3 mL  3 mL Intravenous Q12H Orland Mustard, MD   3 mL at 11/20/22 0848   sodium chloride flush (NS) 0.9 % injection 3 mL  3 mL Intravenous PRN Orland Mustard, MD         Discharge Medications: Please see discharge summary for a list of discharge medications.  Relevant Imaging Results:  Relevant Lab Results:   Additional Information SS#: [id number]. Pfizer COVID-19 Vaccine 09/14/2020 , 10/29/2019 , 10/05/2019 , 09/24/2019  Ralene Bathe, LCSWA

## 2022-11-20 NOTE — Evaluation (Signed)
Physical Therapy Evaluation  Patient Details Name: Heidi Dominguez MRN: [id number] DOB: 1921-01-01 Today's Date: 11/20/2022  History of Present Illness  Heidi Dominguez is a 89 y.o. female who returned to [facility] from SNF due to shortness of breath chest pain and hypoxia. Of note pt recently admitted 3/22-4/5 for R shoulder infection. PMHx: arthritis, CHF, CKD, Dyspnea, GERD, HOH, HTN, OA, bilateral shoulder replacements   Clinical Impression  Pt admitted with above diagnosis. Pt currently with functional limitations due to the deficits listed below (see PT Problem List). At the time of PT eval pt was able to perform transfers to/from EOB with up to +2 max assist. Attempted sit>stand x2 however unable to achieve full stand. Acutely, pt will benefit from acute skilled PT to increase their independence and safety with mobility to allow discharge.          Recommendations for follow up therapy are one component of a multi-disciplinary discharge planning process, led by the attending physician.  Recommendations may be updated based on patient status, additional functional criteria and insurance authorization.  Follow Up Recommendations Can patient physically be transported by private vehicle: No     Assistance Recommended at Discharge Frequent or constant Supervision/Assistance  Patient can return home with the following  Two people to help with walking and/or transfers;Two people to help with bathing/dressing/bathroom;Assistance with cooking/housework;Assist for transportation;Help with stairs or ramp for entrance;Assistance with feeding;Direct supervision/assist for medications management;Direct supervision/assist for financial management    Equipment Recommendations Hospital bed;Other (comment) (hoyer)  Recommendations for Other Services       Functional Status Assessment Patient has had a recent decline in their functional status and demonstrates the ability to make significant improvements in  function in a reasonable and predictable amount of time.     Precautions / Restrictions Precautions Precautions: Fall Restrictions Weight Bearing Restrictions: No      Mobility  Bed Mobility Overal bed mobility: Needs Assistance Bed Mobility: Rolling, Sidelying to Sit, Sit to Supine Rolling: Mod assist Sidelying to sit: Max assist   Sit to supine: Max assist, +2 for physical assistance   General bed mobility comments: max assist required for transition to/from EOB. Pt able to roll with more independence however still requiring up to mod assist for full roll.    Transfers Overall transfer level: Needs assistance Equipment used: Ambulation equipment used, 2 person hand held assist Transfers: Sit to/from Stand Sit to Stand: Total assist, +2 physical assistance, From elevated surface           General transfer comment: Attempted once with 2 person HHA and once with Stedy. On both attempts pt unable to achieve full stand.    Ambulation/Gait               General Gait Details: Pt was non-ambulatory prior to hosptlization working on standing with PT at long term care facility  Stairs            Wheelchair Mobility    Modified Rankin (Stroke Patients Only)       Balance Overall balance assessment: Needs assistance Sitting-balance support: Feet supported, No upper extremity supported Sitting balance-Leahy Scale: Fair Sitting balance - Comments: min assist for sitting balance on EOB due to posterior leaning Postural control: Posterior lean Standing balance support: During functional activity, Bilateral upper extremity supported, Reliant on assistive device for balance Standing balance-Leahy Scale: Zero  Pertinent Vitals/Pain Pain Assessment Pain Assessment: Faces Faces Pain Scale: Hurts a little bit Pain Location: R shoulder with movement Pain Descriptors / Indicators: Discomfort, Grimacing Pain  Intervention(s): Monitored during session, Repositioned    Home Living Family/patient expects to be discharged to:: Skilled nursing facility                   Additional Comments: LTC, pt states she has been working with PT    Prior Function Prior Level of Function : Needs assist             Mobility Comments: Per pt staff assists with SPT and she spends most of the day in the [facility]. Pt reports being active with PT working on standing. ADLs Comments: Staff assists with all ADLs     Hand Dominance   Dominant Hand: Right    Extremity/Trunk Assessment   Upper Extremity Assessment Upper Extremity Assessment: Defer to OT evaluation    Lower Extremity Assessment Lower Extremity Assessment: Generalized weakness (Minimal knee extension noted while sitting EOB. 2+/5 bilaterally.)    Cervical / Trunk Assessment Cervical / Trunk Assessment: Kyphotic  Communication   Communication: HOH  Cognition Arousal/Alertness: Awake/alert Behavior During Therapy: WFL for tasks assessed/performed Overall Cognitive Status: Difficult to assess                                 General Comments: able to follow simple commands        General Comments General comments (skin integrity, edema, etc.): Pt on 2L/min supplemental O2 and sats 90-91% throughout session.    Exercises     Assessment/Plan    PT Assessment Patient needs continued PT services  PT Problem List Decreased strength;Decreased mobility;Decreased safety awareness;Decreased activity tolerance;Decreased balance;Decreased cognition;Pain       PT Treatment Interventions DME instruction;Therapeutic activities;Gait training;Therapeutic exercise;Patient/family education;Balance training;Functional mobility training;Neuromuscular re-education;Wheelchair mobility training    PT Goals (Current goals can be found in the Care Plan section)  Acute Rehab PT Goals Patient Stated Goal: To continue to work with PT and  get stronger PT Goal Formulation: With patient Time For Goal Achievement: 12/04/22 Potential to Achieve Goals: Fair    Frequency Min 2X/week     Co-evaluation               AM-PAC PT "6 Clicks" Mobility  Outcome Measure Help needed turning from your back to your side while in a flat bed without using bedrails?: A Lot Help needed moving from lying on your back to sitting on the side of a flat bed without using bedrails?: Total Help needed moving to and from a bed to a chair (including a wheelchair)?: Total Help needed standing up from a chair using your arms (e.g., wheelchair or bedside chair)?: Total Help needed to walk in hospital room?: Total Help needed climbing 3-5 steps with a railing? : Total 6 Click Score: 7    End of Session Equipment Utilized During Treatment: Oxygen;Gait belt Activity Tolerance: Patient limited by fatigue (weakness) Patient left: in bed;with call bell/phone within reach;with bed alarm set;with nursing/sitter in room Nurse Communication: Mobility status;Need for lift equipment PT Visit Diagnosis: Other abnormalities of gait and mobility (R26.89);Muscle weakness (generalized) (M62.81)    Time: 9622-2979 PT Time Calculation (min) (ACUTE ONLY): 25 min   Charges:   PT Evaluation $PT Eval Moderate Complexity: 1 Mod PT Treatments $Gait Training: 8-22 mins  Conni SlipperLaura Gunter Conde, PT, DPT Acute Rehabilitation Services Secure Chat Preferred Office: [phone]   Marylynn PearsonLaura D Aidric Endicott 11/20/2022, 1:08 PM

## 2022-11-20 NOTE — Progress Notes (Signed)
PROGRESS NOTE Heidi Dominguez  ZOX:[id number] DOB: [birth year] DOA: 11/17/2022 PCP: Merlene Laughter, MD  Brief Narrative/Hospital Course: 89 y.o.f w/ significant medical history including HFpEF, dysphagia, chronic hypoxic respiratory failure, COPD, normocytic anemia T2DM, GERD with large hiatal hernia,HTN, HLD and recent admission for right shoulder PJI complicated by acute CHF and hyponatremia who was admitted from 3/22-4/5 and discharged to skilled nursing facility seen back from the skilled nursing facility due to shortness of breath chest pain and hypoxia  She was given nitroglycerin and sent back.  In the ED denied chest pain, was complaining of wheezing and coughing more No fever chills vision changes headache, nausea vomiting diarrhea dysuria.  Noted leg swelling but stated that the same as before. In the ED hemodynamically stable, 92% on 2 L nasal cannula labs with hemoglobin 8.8 BNP 281, troponin negative x 2, blood gas with pH 7.4 pCO2 65 pO2 60, mild hyponatremia 134 CBC with WBC 10.0.  Significant studies  CXR: stable mild to moderate severity cardiac silhouette enlargement.Large hiatal hernia and adjacent left basilar atelectasis.  CTA chest: single small branch point arterial embolism in posterior right lower lobe. No other embolus is seen. Prominent pulmonary trunk and main pulmonary arteries with reflux into the IVC and hepatic veins suggesting right heart dysfunction.  Cardiomegaly, increased pericardial effusion, Small layering pleural effusion with adjacent atelectasis or consolidation in lower lobes  Large hiatal hernia with inverted intrathoracic stomach, mild body wall edema.  4.4x3.3cm right thyroid nodule, biopsied in 2017 CT head was integrated, no acute finding Patient was given IV Lasix placed on Eliquis and admitted for further management    Subjective: Seen and examined this morning nursing cleaning her earlier ate breakfast no complaints.  She denies any shortness of  breath but does have wheezing on exam Leg appears edematous but slightly better Overnight afebrile BP holding 130s to 150s, on 2 L nasal cannula Labs this morning shows hyponatremia 128 hemoglobin stable 8.7   Assessment and Plan: Principal Problem:   Acute on chronic respiratory failure with hypoxia Active Problems:   Pulmonary embolism   COPD with acute exacerbation   Acute on chronic diastolic CHF (congestive heart failure)   Right shoulder prosthetic joint infection   Severe aortic stenosis   Confusion   Diabetes mellitus type 2, controlled   Essential hypertension   CKD (chronic kidney disease) stage 3, GFR 30-59 ml/min   Gastroesophageal reflux disease   Normocytic anemia   Acute pulmonary embolism   Acute on chronic respiratory failure with hypoxia and hypercapnea Wheezing, concerning for acute COPD exacerbation: Hypoxia and wheezing likely multifactorial with PE, concern for COPD exacerbation, also CHF exacerbation / severe aortic stenosis . She has intrathoracic inverted stomach, at risk for aspiration and chronic lung disease.  she has had wheezing on multiple previous examination.still wheezing this morning continue on Pulmicort nebulizer, IV Lasix, oral prednisone cont Fvalve,Mucinex. Procalcitonin, respiratory virus panel, COVID and flu negative.   Cont PTOT  Acute pulmonary embolism:CT angio noted single small branch point arterial embolism within the posterior right lower lobe.  Of note patient was appropriately on DVT prophylaxis with heparin 5000 units q 8hr.Unclear etiology.Eliquis started after discussion with family about AC and fall risk, we will continue  same, doppler negative for DVT, echo shows EF 60 to 65%, small pericardial effusion unchanged from 3/28, no RWMA.  Hemoccult pending  Acute on chronic diastolic CHF exacerbation Severe aortic stenosis Small pericardial effusion stable Lower leg edema somewhat chronic: Has lower leg edema-but  per patient has  been there before, no significant JVD noted.  Continue gentle IV Lasix, add compression of the legs. Limited echo showed a stable pericardial effusion which is small.Weight stable  as below> Discharge weight 86.5 kg on 6/5 Filed Weights   11/18/22 0935 11/19/22 0538  Weight: 84.1 kg 81.7 kg   Right shoulder prosthetic joint infection with E. Coli: At under gone IR aspiration and evaluation by Ortho and ID not a candidate for operative removal of hardware or debridement, plan was to continue 6 weeks of cefadroxil as outpatient and lifelong suppressive therapy after that- cont same  Altered mental status reportedly confused in ED-but on admission was oriented-see appears at baseline.  But hard of hearing CT head no acute finding mild hypercapnia on VBG.  Appears pleasant alert awake interactive oriented to place president people-at baseline.  GERD Large hiatal hernia with inverted intrathoracic stomach Dysphagia: Recently seen by SLP/G. She has had esophageal narrowing -was seen by [facility] she is at risk for aspiration related lung disease continue to encourage SLP, continue diet in relation to her hiatal hernia-keep upright 2 hours post prandial, encourage small frequent meals.GI had seen her previously-EGD was not possible due to risk of sedation and risk of dilatation. Cont  PPI, not a candidate for surgical intervention for hernia.  Type 2 diabetes mellitus A1c 7.4 recently, metformin on hold> continue Levemir 5u, SSI Recent Labs  Lab 11/19/22 1122 11/19/22 1630 11/19/22 2148 11/20/22 0718 11/20/22 1119  GLUCAP 276* 185* 228* 152* 218*     Essential hypertension>cont  Norvasc at lower dose of 5mg  while on iv lasix, cont to monitor BP stable.  Normocytic anemia hemoglobin holding in 8-9 g range for several days previously was up to 10-11- unclear if that was hemoconcentrated value.  Per family request Hemoccult has been ordered . Recent Labs  Lab 11/16/22 0357 11/17/22 0930  11/17/22 2027 11/19/22 0225 11/20/22 0157  HGB 8.9* 8.8* 9.0* 8.7* 8.7*  HCT 29.5* 29.3* 30.0* 28.8* 28.7*     Goals of care: DNR overall prognosis does not appear bright, currently hemodynamically stable.  Seen by palliative care in 2022 Had strongly encouraged patient's daughter to consider outpatient palliative care at facility, [facility] notified as daughter has agreed to discuss with the palliative services provided like [facility].  Patient family has been informed high risk of readmissions, decompensation given her multiple comorbidities, advanced age.  Class I Obesity:Patient's Body mass index is 30.92 kg/m. : Will benefit with PCP follow-up, weight loss  healthy lifestyle and outpatient sleep evaluation.  Deconditioning/debility: We will get PT OT evaluation. Ambulatory dysfunction: Getting PT OT evaluation.  Patient reports he has not walked in normal.  DVT prophylaxis: eliquis Code Status:   Code Status: DNR Family Communication: plan of care discussed with patient at bedside. I had discussed with the daughter at the bedside previously   Level of care: Telemetry Medical  Dispo: The patient is from: NH            Anticipated disposition: NH 1-2 DAY  Objective: Vitals last 24 hrs: Vitals:   11/19/22 1648 11/19/22 2041 11/20/22 0300 11/20/22 0752  BP: 127/60  130/84 (!) 150/69  Pulse: 88  79 89  Resp: 18  18 16   Temp: 98 F (36.7 C)  98 F (36.7 C) 98 F (36.7 C)  TempSrc: Oral  Oral Oral  SpO2: 98% 99% 99% 98%  Weight:      Height:       Weight  change:   Physical Examination: General exam: alert awake, fairly oriented, pleasant HEENT:Oral mucosa moist, Ear/Nose WNL grossly Respiratory system: Bilaterally wheezing on auscultation, no use of accessory muscle Cardiovascular system: S1 & S2 +, No JVD. Gastrointestinal system: Abdomen soft,NT,ND, BS+ Nervous System: Alert, awake, moving extremities, she follows commands. Extremities: LE edema ++,distal peripheral  pulses palpable.  Skin: No rashes,no icterus. MSK: Normal muscle bulk,tone, power  Medications reviewed:  Scheduled Meds:  amLODipine  5 mg Oral Daily   apixaban  10 mg Oral BID   Followed by   Melene Muller[START ON 11/25/2022] apixaban  5 mg Oral BID   budesonide  0.5 mg Nebulization BID   cefadroxil  1,000 mg Oral BID   Followed by   Melene Muller[START ON 12/16/2022] cefadroxil  500 mg Oral BID   dextromethorphan-guaiFENesin  1 tablet Oral BID   feeding supplement (GLUCERNA SHAKE)  237 mL Oral BID BM   furosemide  20 mg Intravenous Daily   insulin aspart  0-9 Units Subcutaneous TID WC   insulin detemir  5 Units Subcutaneous Daily   loratadine  10 mg Oral QHS   multivitamin with minerals  1 tablet Oral Daily   pantoprazole  40 mg Oral Daily   polyvinyl alcohol  2 drop Both Eyes Daily   predniSONE  40 mg Oral Q breakfast   sodium chloride flush  3 mL Intravenous Q12H  Continuous Infusions:  sodium chloride      Diet Order             Diet Carb Modified Fluid consistency: Thin; Room service appropriate? Yes  Diet effective now                   Intake/Output Summary (Last 24 hours) at 11/20/2022 1134 Last data filed at 11/20/2022 0859 Gross per 24 hour  Intake 600 ml  Output 1200 ml  Net -600 ml    Net IO Since Admission: -2,060 mL [11/20/22 1134]  Wt Readings from Last 3 Encounters:  11/19/22 81.7 kg  11/03/22 86.5 kg  10/25/22 78 kg     Unresulted Labs (From admission, onward)     Start     Ordered   11/20/22 0500  Basic metabolic panel  Daily,   R      11/19/22 1128   11/20/22 0500  CBC  Daily,   R      11/19/22 1128   11/18/22 1040  Expectorated Sputum Assessment w Gram Stain, Rflx to Resp Cult  (COPD / Pneumonia / Cellulitis / Lower Extremity Wound)  Once,   R        11/18/22 1045   Unscheduled  Occult blood card to lab, stool  As needed,   R      11/19/22 1404          Data Reviewed: I have personally reviewed following labs and imaging studies CBC: Recent Labs  Lab  11/15/22 0355 11/16/22 0357 11/17/22 0930 11/17/22 2027 11/19/22 0225 11/20/22 0157  WBC 10.7* 9.3 7.3 10.1 8.2 10.2  NEUTROABS 8.7*  --   --   --   --   --   HGB 8.8* 8.9* 8.8* 9.0* 8.7* 8.7*  HCT 27.7* 29.5* 29.3* 30.0* 28.8* 28.7*  MCV 86.6 88.9 90.7 89.8 88.1 88.3  PLT 343 364 373 382 375 357    Basic Metabolic Panel: Recent Labs  Lab 11/14/22 0313 11/15/22 0355 11/16/22 0357 11/17/22 0930 11/17/22 2027 11/18/22 1429 11/19/22 0225 11/20/22 0157  NA 128* 128*  132* 134* 134*  --  130* 128*  K 4.4 4.4 4.3 4.5 4.7  --  4.2 4.3  CL 86* 86* 86* 87* 87*  --  81* 83*  CO2 32 34* 35* 38* 37*  --  40* 38*  GLUCOSE 167* 158* 113* 187* 165*  --  245* 201*  BUN 23 23 20 16 15   --  9 9  CREATININE 0.77 0.80 0.68 0.55 0.59  --  0.52 0.54  CALCIUM 9.2 9.2 9.4 9.3 9.5  --  9.3 9.0  MG  --   --   --   --   --  2.0  --   --   PHOS 2.8 2.8  --   --   --   --   --   --     GFR: Estimated Creatinine Clearance: 39.6 mL/min (by C-G formula based on SCr of 0.54 mg/dL). Liver Function Tests: Recent Labs  Lab 11/14/22 0313 11/15/22 0355 11/16/22 0357 11/17/22 2027  AST  --   --  19 17  ALT  --   --  21 16  ALKPHOS  --   --  62 63  BILITOT  --   --  0.5 0.5  PROT  --   --  6.1* 6.5  ALBUMIN 2.3* 2.2* 2.1* 2.2*    Recent Labs  Lab 11/17/22 2027  LIPASE 20    Recent Labs  Lab 11/15/22 0355  AMMONIA 30   Coagulation Profile: Recent Labs  Lab 11/19/22 0225  INR 1.2   BNP (last 3 results) Recent Labs    09/27/22 1218  PROBNP 77.0   HbA1C: No results for input(s): "HGBA1C" in the last 72 hours. Recent Labs  Lab 11/18/22 1429 11/19/22 0225  PROCALCITON <0.10 <0.10     Recent Results (from the past 240 hour(s))  Resp panel by RT-PCR (RSV, Flu A&B, Covid) Urine, Catheterized     Status: None   Collection Time: 11/17/22 11:56 PM   Specimen: Urine, Catheterized; Nasal Swab  Result Value Ref Range Status   SARS Coronavirus 2 by RT PCR NEGATIVE NEGATIVE Final    Influenza A by PCR NEGATIVE NEGATIVE Final   Influenza B by PCR NEGATIVE NEGATIVE Final    Comment: (NOTE) The Xpert Xpress SARS-CoV-2/FLU/RSV plus assay is intended as an aid in the diagnosis of influenza from Nasopharyngeal swab specimens and should not be used as a sole basis for treatment. Nasal washings and aspirates are unacceptable for Xpert Xpress SARS-CoV-2/FLU/RSV testing.  Fact Sheet for Patients: BloggerCourse.com  Fact Sheet for Healthcare Providers: SeriousBroker.it  This test is not yet approved or cleared by the [place] FDA and has been authorized for detection and/or diagnosis of SARS-CoV-2 by FDA under an Emergency Use Authorization (EUA). This EUA will remain in effect (meaning this test can be used) for the duration of the COVID-19 declaration under Section 564(b)(1) of the Act, 21 U.S.C. section 360bbb-3(b)(1), unless the authorization is terminated or revoked.     Resp Syncytial Virus by PCR NEGATIVE NEGATIVE Final    Comment: (NOTE) Fact Sheet for Patients: BloggerCourse.com  Fact Sheet for Healthcare Providers: SeriousBroker.it  This test is not yet approved or cleared by the [place] FDA and has been authorized for detection and/or diagnosis of SARS-CoV-2 by FDA under an Emergency Use Authorization (EUA). This EUA will remain in effect (meaning this test can be used) for the duration of the COVID-19 declaration under Section 564(b)(1) of the Act, 21 U.S.C. section  360bbb-3(b)(1), unless the authorization is terminated or revoked.  Performed at [facility], [address] [place], [place] [address]   Respiratory (~20 pathogens) panel by PCR     Status: None   Collection Time: 11/18/22 10:40 AM   Specimen: Nasopharyngeal Swab; Respiratory  Result Value Ref Range Status   Adenovirus NOT DETECTED NOT DETECTED Final   Coronavirus  229E NOT DETECTED NOT DETECTED Final    Comment: (NOTE) The Coronavirus on the Respiratory Panel, DOES NOT test for the novel  Coronavirus (2019 nCoV)    Coronavirus HKU1 NOT DETECTED NOT DETECTED Final   Coronavirus NL63 NOT DETECTED NOT DETECTED Final   Coronavirus OC43 NOT DETECTED NOT DETECTED Final   Metapneumovirus NOT DETECTED NOT DETECTED Final   Rhinovirus / Enterovirus NOT DETECTED NOT DETECTED Final   Influenza A NOT DETECTED NOT DETECTED Final   Influenza B NOT DETECTED NOT DETECTED Final   Parainfluenza Virus 1 NOT DETECTED NOT DETECTED Final   Parainfluenza Virus 2 NOT DETECTED NOT DETECTED Final   Parainfluenza Virus 3 NOT DETECTED NOT DETECTED Final   Parainfluenza Virus 4 NOT DETECTED NOT DETECTED Final   Respiratory Syncytial Virus NOT DETECTED NOT DETECTED Final   Bordetella pertussis NOT DETECTED NOT DETECTED Final   Bordetella Parapertussis NOT DETECTED NOT DETECTED Final   Chlamydophila pneumoniae NOT DETECTED NOT DETECTED Final   Mycoplasma pneumoniae NOT DETECTED NOT DETECTED Final    Comment: Performed at [facility], [address], [place], [place] [address]    Antimicrobials: Anti-infectives (From admission, onward)    Start     Dose/Rate Route Frequency Ordered Stop   12/16/22 1000  cefadroxil (DURICEF) capsule 500 mg       See Hyperspace for full Linked Orders Report.   500 mg Oral 2 times daily 11/19/22 1226     11/19/22 1330  cefadroxil (DURICEF) capsule 1,000 mg       See Hyperspace for full Linked Orders Report.   1,000 mg Oral 2 times daily 11/19/22 1226 12/16/22 0959   11/18/22 1200  ceFEPIme (MAXIPIME) 2 g in sodium chloride 0.9 % 100 mL IVPB  Status:  Discontinued        2 g 200 mL/hr over 30 Minutes Intravenous Every 12 hours 11/18/22 1045 11/19/22 1224      Culture/Microbiology    Component Value Date/Time   SDES ABSCESS 11/04/2022 1243   SPECREQUEST DRAIN 2 RIGHT ANTERIOR SHOULDER 11/04/2022 1243   CULT  11/04/2022 1243     FEW ESCHERICHIA COLI NO ANAEROBES ISOLATED Performed at [facility], [address] [place], [place] [address]    REPTSTATUS 11/17/2022 FINAL 11/04/2022 1243  Other culture-see note  Radiology Studies: VAS [place] LOWER EXTREMITY VENOUS (DVT)  Result Date: 11/19/2022  Lower Venous DVT Study Patient Name:  LUELLEN HOWSON  Date of Exam:   11/18/2022 Medical Rec #: [id number]        Accession #:    [id number] Date of Birth: [birth year]        Patient Gender: F Patient Age:   89 years Exam Location:  [facility] Procedure:      VAS [place] LOWER EXTREMITY VENOUS (DVT) Referring Phys: Orland Mustard --------------------------------------------------------------------------------  Indications: Pulmonary embolism, and Edema.  Limitations: Body habitus, poor ultrasound/tissue interface and edema, pain with compression. Comparison Study: Prior negative bilateral LEV done 03/16/2021 Performing Technologist: Sherren Kerns RVS  Examination Guidelines: A complete evaluation includes B-mode imaging, spectral Doppler, color Doppler, and  power Doppler as needed of all accessible portions of each vessel. Bilateral testing is considered an integral part of a complete examination. Limited examinations for reoccurring indications may be performed as noted. The reflux portion of the exam is performed with the patient in reverse Trendelenburg.  +---------+---------------+---------+-----------+----------+-------------------+ RIGHT    CompressibilityPhasicitySpontaneityPropertiesThrombus Aging      +---------+---------------+---------+-----------+----------+-------------------+ CFV      Full           Yes      Yes                                      +---------+---------------+---------+-----------+----------+-------------------+ SFJ      Full                                                             +---------+---------------+---------+-----------+----------+-------------------+ FV Prox  Full                                                              +---------+---------------+---------+-----------+----------+-------------------+ FV Mid                  Yes      Yes                  patent by color and                                                       Doppler             +---------+---------------+---------+-----------+----------+-------------------+ FV DistalFull           Yes      Yes                                      +---------+---------------+---------+-----------+----------+-------------------+ PFV      Full                                                             +---------+---------------+---------+-----------+----------+-------------------+ POP                                                   Not visualized      +---------+---------------+---------+-----------+----------+-------------------+ PTV  patent by color and                                                       Doppler             +---------+---------------+---------+-----------+----------+-------------------+ PERO                                                  Not well visualized +---------+---------------+---------+-----------+----------+-------------------+   +---------+---------------+---------+-----------+----------+-------------------+ LEFT     CompressibilityPhasicitySpontaneityPropertiesThrombus Aging      +---------+---------------+---------+-----------+----------+-------------------+ CFV      Full           Yes      Yes                                      +---------+---------------+---------+-----------+----------+-------------------+ SFJ      Full                                                             +---------+---------------+---------+-----------+----------+-------------------+ FV Prox  Full                                                              +---------+---------------+---------+-----------+----------+-------------------+ FV Mid   Full           Yes      Yes                                      +---------+---------------+---------+-----------+----------+-------------------+ FV DistalFull                                                             +---------+---------------+---------+-----------+----------+-------------------+ PFV      Full                                                             +---------+---------------+---------+-----------+----------+-------------------+ POP                     Yes      Yes                  patent by color and  Doppler             +---------+---------------+---------+-----------+----------+-------------------+ PTV                                                   Not well visualized +---------+---------------+---------+-----------+----------+-------------------+ PERO                                                  Not well visualized +---------+---------------+---------+-----------+----------+-------------------+     Summary: RIGHT: - There is no evidence of deep vein thrombosis in the lower extremity. However, portions of this examination were limited- see technologist comments above.  LEFT: - There is no evidence of deep vein thrombosis in the lower extremity. However, portions of this examination were limited- see technologist comments above.  *See table(s) above for measurements and observations. Electronically signed by Waverly Ferrari MD on 11/19/2022 at 11:02:32 AM.    Final    ECHOCARDIOGRAM LIMITED  Result Date: 11/19/2022    ECHOCARDIOGRAM LIMITED REPORT   Patient Name:   TIFFIANY BEADLES Date of Exam: 11/19/2022 Medical Rec #:  [id number]       Height:       64.0 in Accession #:    [id number]      Weight:       180.1 lb Date of Birth:  Jan 09, 1921       BSA:          1.871 m Patient Age:    89 years         BP:           126/54 mmHg Patient Gender: F               HR:           96 bpm. Exam Location:  Inpatient Procedure: Limited Echo, Limited Color Doppler and Cardiac Doppler Indications:    Pericardial effusion I31.3  History:        Patient has prior history of Echocardiogram examinations, most                 recent 11/09/2022. P.E., Aortic Valve Disease and Mitral Valve                 Disease; Risk Factors:Hypertension, Diabetes, Non-Smoker and                 Dyslipidemia.  Sonographer:    Aron Baba Referring Phys: [id number] Orland Mustard  Sonographer Comments: Image acquisition challenging due to patient body habitus and Image acquisition challenging due to respiratory motion. IMPRESSIONS  1. Limted study for pericardial effusion; full doppler not performed; pericardial effusion remains small and unchanged compared to 11/09/22.  2. Left ventricular ejection fraction, by estimation, is 60 to 65%. The left ventricle has normal function. The left ventricle has no regional wall motion abnormalities. There is moderate concentric left ventricular hypertrophy.  3. Right ventricular systolic function is mildly reduced. The right ventricular size is mildly enlarged.  4. Left atrial size was severely dilated.  5. A small pericardial effusion is present.  6. Mild mitral valve regurgitation. Severe mitral annular calcification. FINDINGS  Left Ventricle: Left ventricular ejection fraction, by estimation, is 60 to 65%. The left ventricle has normal function.  The left ventricle has no regional wall motion abnormalities. The left ventricular internal cavity size was normal in size. There is  moderate concentric left ventricular hypertrophy. Right Ventricle: The right ventricular size is mildly enlarged. Right ventricular systolic function is mildly reduced. Left Atrium: Left atrial size was severely dilated. Right Atrium: Right atrial size was normal in size. Pericardium: A small pericardial effusion is present. Mitral  Valve: Severe mitral annular calcification. Mild mitral valve regurgitation. Tricuspid Valve: The tricuspid valve is normal in structure. Tricuspid valve regurgitation is mild. IAS/Shunts: No atrial level shunt detected by color flow Doppler. Additional Comments: Limted study for pericardial effusion; full doppler not performed; pericardial effusion remains small and unchanged compared to 11/09/22. Spectral Doppler performed. Color Doppler performed.  LEFT VENTRICLE PLAX 2D LVIDd:         3.60 cm LVIDs:         2.60 cm LV PW:         1.50 cm LV IVS:        1.20 cm  Olga Millers MD Electronically signed by Olga Millers MD Signature Date/Time: 11/19/2022/10:07:47 AM    Final    CT HEAD WO CONTRAST ( )  Result Date: 11/18/2022 CLINICAL DATA:  Provided history: Mental status change, unknown cause. EXAM: CT HEAD WITHOUT CONTRAST TECHNIQUE: Contiguous axial images were obtained from the base of the skull through the vertex without intravenous contrast. RADIATION DOSE REDUCTION: This exam was performed according to the departmental dose-optimization program which includes automated exposure control, adjustment of the mA and/or kV according to patient size and/or use of iterative reconstruction technique. COMPARISON:  Head CT 11/02/2019. FINDINGS: Motion degraded examination, limiting evaluation. Brain: Generalized cerebral atrophy. Patchy and ill-defined hypoattenuation within the cerebral white matter, nonspecific but compatible with advanced chronic small vessel ischemic disease. Within limitations of motion degradation, no acute intracranial hemorrhage, acute infarct, extra-axial fluid collection or intracranial mass is identified. No midline shift. Vascular: No hyperdense vessel. Atherosclerotic calcifications. Skull: No fracture or aggressive osseous lesion. Sinuses/Orbits: No mass or acute finding within the imaged orbits. No significant paranasal sinus disease at the imaged levels. IMPRESSION: 1. Significantly  motion degraded examination. 2. Within this limitation, no acute intracranial abnormality is identified. 3. Parenchymal atrophy and chronic small vessel ischemic disease. Electronically Signed   By: Jackey Loge D.O.   On: 11/18/2022 16:38     LOS: 1 day   Lanae Boast, MD [facility]  11/20/2022, 11:34 AM

## 2022-11-20 NOTE — Evaluation (Signed)
Clinical/Bedside Swallow Evaluation Patient Details  Name: Heidi Dominguez MRN: [id number] Date of Birth: 02-27-1921  Today's Date: 11/20/2022 Time: SLP Start Time (ACUTE ONLY): 1400 SLP Stop Time (ACUTE ONLY): 1409 SLP Time Calculation (min) (ACUTE ONLY): 9 min  Past Medical History:  Past Medical History:  Diagnosis Date   Acute pancreatitis    hx of    Arthritis    "qwhere" (07/04/2018)   Chronic back pain    "all over" (07/04/2018)   Chronic diastolic CHF (congestive heart failure) (HCC)    Chronic kidney disease    stage  3 chronic kidney disease    Complication of anesthesia    "I have a hard time waking up"   Degenerative joint disease of shoulder region    Dry eye syndrome    Dyspnea    Family history of adverse reaction to anesthesia    "daughter has the shakes when she wakes up"   GERD (gastroesophageal reflux disease)    Heart murmur    High cholesterol    History of hiatal hernia    HOH (hard of hearing)    Hypertension    Impacted cerumen of both ears    hx of    Muscle weakness (generalized)    Osteoarthritis    Overactive bladder    Pancreatitis    hx of    Phlebitis    "BLE"   Rotator cuff tear    right    Tear of right supraspinatus tendon    Thrombocytopenia (HCC)    hx of    Type II diabetes mellitus (HCC)    Urinary tract infection    hx of    UTI (urinary tract infection) 09/12/2018   Xerosis cutis    hx of    Past Surgical History:  Past Surgical History:  Procedure Laterality Date   ABDOMINAL AORTOGRAM W/LOWER EXTREMITY N/A 12/22/2020   Procedure: ABDOMINAL AORTOGRAM W/LOWER EXTREMITY;  Surgeon: Leonie Douglas, MD;  Location: [facility];  Service: Cardiovascular;  Laterality: N/A;   ABDOMINAL HYSTERECTOMY     BACK SURGERY     CATARACT EXTRACTION W/ INTRAOCULAR LENS  IMPLANT, BILATERAL Bilateral    CHOLECYSTECTOMY  2016   ERCP N/A 08/30/2018   Procedure: ENDOSCOPIC RETROGRADE CHOLANGIOPANCREATOGRAPHY (ERCP);  Surgeon: Vida Rigger, MD;  Location: [facility];  Service: Endoscopy;  Laterality: N/A;   FIXATION KYPHOPLASTY     INCISION AND DRAINAGE Right 11/08/2018   Procedure: INCISION AND DRAINAGE right shoulder, placement of antibiotic beads ;  Surgeon: Beverely Low, MD;  Location: [facility];  Service: Orthopedics;  Laterality: Right;   IR US GUIDE BX ASP/DRAIN  11/04/2022   IR US GUIDE BX ASP/DRAIN  11/04/2022   JOINT REPLACEMENT     LAPAROSCOPIC CHOLECYSTECTOMY SINGLE SITE WITH INTRAOPERATIVE CHOLANGIOGRAM N/A 04/11/2015   Procedure: LAPAROSCOPIC LYSIS OF ADHESIONS, LAPAROSCOPIC CHOLECYSTECTOMY WITH INTRAOPERATIVE CHOLANGIOGRAM;  Surgeon: Karie Soda, MD;  Location: [facility];  Service: General;  Laterality: N/A;   PANCREATIC STENT PLACEMENT  08/30/2018   Procedure: PANCREATIC STENT PLACEMENT;  Surgeon: Vida Rigger, MD;  Location: [facility];  Service: Endoscopy;;   PERIPHERAL VASCULAR INTERVENTION  12/22/2020   Procedure: PERIPHERAL VASCULAR INTERVENTION;  Surgeon: Leonie Douglas, MD;  Location: [facility];  Service: Cardiovascular;;  left anterior tibial artery   REMOVAL OF STONES  08/30/2018   Procedure: REMOVAL OF STONES;  Surgeon: Vida Rigger, MD;  Location: [facility];  Service: Endoscopy;;   SHOULDER OPEN ROTATOR CUFF REPAIR Bilateral  SPHINCTEROTOMY  08/30/2018   Procedure: SPHINCTEROTOMY;  Surgeon: Vida Rigger, MD;  Location: [facility];  Service: Endoscopy;;   TOTAL KNEE ARTHROPLASTY Bilateral    HPI:  Heidi Dominguez is a 89 y.o. female admitted with CHF exacerbation. Discharged and immediately readmitted with difficulty breathing . She is known to SLP services and has had several swallowing evaluations in the last year; last seen on 11/06/22.  She has a hx of esophageal dysphagia. MBS 07/20/22: oral and ororpharyngeal function WNL, particularly for age. After a few ounces of solids and liquids pt began belching and reported pressure; esophageal sweep showed barium throughout esophageal column. Pt  followed by outpatient GI and no SLP follow up was recommended. Bedside swallow evaluation 09/29/22 and 11/06/22 with continued functional oropharyngeal swallow but concerns for post-prandial aspiration with ongoing c/o globus, regurgitation, belching. Pt had esophagram on 11/12/22 showing "Focal narrowing of the distal esophagus compatible with  stricture, similar to the prior upper GI series 06/20/2017. It is  estimated that the esophagus is narrowed to 5-7 mm at this site.  3. Mildly patulous esophagus elsewhere.  4. Prominent esophageal dysmotility with tertiary contractions."  Esophageal precautions with regular diet recommended.    Assessment / Plan / Recommendation  Clinical Impression  Pts swallow function again consistent with prior assessment. Pt is able to sip water without signs of aspiration. She can masticate small bites of solids and uses a liquid wash to rinse the solids down. She spits out very difficult textures like grape skins. Overall, she is following esophageal precautions independently and managing oral intake as best as can be expected given previously diagnosed esophageal dysphagia and dysmotility. Would not recommend limiting pts food choices by modifying texture. No SLP f/u needed as pt is currently independent and consuming meals as best as possible. SLP Visit Diagnosis: Dysphagia, unspecified (R13.10)    Aspiration Risk  Risk for inadequate nutrition/hydration;Mild aspiration risk    Diet Recommendation Regular;Thin liquid   Liquid Administration via: Cup;Straw Medication Administration: Crushed with puree Supervision: Patient able to self feed Compensations: Slow rate;Small sips/bites;Follow solids with liquid Postural Changes: Seated upright at 90 degrees;Remain upright for at least 30 minutes after po intake    Other  Recommendations Oral Care Recommendations: Oral care BID    Recommendations for follow up therapy are one component of a multi-disciplinary discharge  planning process, led by the attending physician.  Recommendations may be updated based on patient status, additional functional criteria and insurance authorization.  Follow up Recommendations No SLP follow up      Assistance Recommended at Discharge    Functional Status Assessment    Frequency and Duration            Prognosis        Swallow Study   General HPI: Heidi Dominguez is a 89 y.o. female admitted with CHF exacerbation. Discharged and immediately readmitted with difficulty breathing . She is known to SLP services and has had several swallowing evaluations in the last year; last seen on 11/06/22.  She has a hx of esophageal dysphagia. MBS 07/20/22: oral and ororpharyngeal function WNL, particularly for age. After a few ounces of solids and liquids pt began belching and reported pressure; esophageal sweep showed barium throughout esophageal column. Pt followed by outpatient GI and no SLP follow up was recommended. Bedside swallow evaluation 09/29/22 and 11/06/22 with continued functional oropharyngeal swallow but concerns for post-prandial aspiration with ongoing c/o globus, regurgitation, belching. Pt had esopahgram on 11/12/22 showing "Focal narrowing  of the distal esophagus compatible with  stricture, similar to the prior upper GI series 06/20/2017. It is  estimated that the esophagus is narrowed to 5-7 mm at this site.  3. Mildly patulous esophagus elsewhere.  4. Prominent esophageal dysmotility with tertiary contractions."  Esophageal precautions with regular diet recommended. Type of Study: Bedside Swallow Evaluation Previous Swallow Assessment: see HPI Diet Prior to this Study: Regular;Thin liquids (Level 0) Temperature Spikes Noted: N/A Respiratory Status: Nasal cannula History of Recent Intubation: No Behavior/Cognition: Alert;Cooperative;Pleasant mood Oral Cavity Assessment: Within Functional Limits Oral Care Completed by SLP: No Oral Cavity - Dentition: Dentures,  top;Dentures, bottom Vision: Functional for self-feeding Self-Feeding Abilities: Needs assist Patient Positioning: Upright in bed Baseline Vocal Quality: Normal Volitional Cough: Strong Volitional Swallow: Able to elicit    Oral/Motor/Sensory Function Overall Oral Motor/Sensory Function: Within functional limits   Ice Chips     Thin Liquid Thin Liquid: Within functional limits Presentation: Straw    Nectar Thick Nectar Thick Liquid: Not tested   Honey Thick Honey Thick Liquid: Not tested   Puree Puree: Within functional limits   Solid     Solid: Within functional limits      Heidi Dominguez, Heidi Dominguez 11/20/2022,2:18 PM

## 2022-11-20 NOTE — TOC Benefit Eligibility Note (Signed)
Patient Advocate Encounter  Insurance verification completed.    The patient is currently admitted and upon discharge could be taking Eliquis 5 mg.  The current 30 day co-pay is $40.00.   The patient is insured through [employer] Part D   This test claim was processed through [facility]- copay amounts may vary at other pharmacies due to pharmacy/plan contracts, or as the patient moves through the different stages of their insurance plan.  Antwine Agosto, CPHT Pharmacy Patient Advocate Specialist [facility] Patient Advocate Team Direct Number: [phone]  Fax: [phone]       

## 2022-11-21 DIAGNOSIS — J9621 Acute and chronic respiratory failure with hypoxia: Secondary | ICD-10-CM | POA: Diagnosis not present

## 2022-11-21 DIAGNOSIS — Z515 Encounter for palliative care: Secondary | ICD-10-CM

## 2022-11-21 LAB — GLUCOSE, CAPILLARY
Glucose-Capillary: 181 mg/dL — ABNORMAL HIGH (ref 70–99)
Glucose-Capillary: 182 mg/dL — ABNORMAL HIGH (ref 70–99)
Glucose-Capillary: 348 mg/dL — ABNORMAL HIGH (ref 70–99)
Glucose-Capillary: 255 mg/dL — ABNORMAL HIGH (ref 70–99)

## 2022-11-21 LAB — CBC
HCT: 29.3 % — ABNORMAL LOW (ref 36.0–46.0)
Hemoglobin: 8.9 g/dL — ABNORMAL LOW (ref 12.0–15.0)
MCH: 26.7 pg (ref 26.0–34.0)
MCHC: 30.4 g/dL (ref 30.0–36.0)
MCV: 88 fL (ref 80.0–100.0)
Platelets: 341 10*3/uL (ref 150–400)
RBC: 3.33 MIL/uL — ABNORMAL LOW (ref 3.87–5.11)
RDW: 16.6 % — ABNORMAL HIGH (ref 11.5–15.5)
WBC: 9.8 10*3/uL (ref 4.0–10.5)
nRBC: 0 % (ref 0.0–0.2)

## 2022-11-21 LAB — BASIC METABOLIC PANEL
Anion gap: 9 (ref 5–15)
BUN: 13 mg/dL (ref 8–23)
CO2: 38 mmol/L — ABNORMAL HIGH (ref 22–32)
Calcium: 8.9 mg/dL (ref 8.9–10.3)
Chloride: 79 mmol/L — ABNORMAL LOW (ref 98–111)
Creatinine, Ser: 0.54 mg/dL (ref 0.44–1.00)
GFR, Estimated: >60 mL/min (ref >60)
Glucose, Bld: 165 mg/dL — ABNORMAL HIGH (ref 70–99)
Potassium: 4.3 mmol/L (ref 3.5–5.1)
Sodium: 126 mmol/L — ABNORMAL LOW (ref 135–145)

## 2022-11-21 LAB — OCCULT BLOOD X 1 CARD TO LAB, STOOL: Fecal Occult Bld: NEGATIVE

## 2022-11-21 MED ORDER — FUROSEMIDE 20 MG PO TABS
20.0000 mg | ORAL_TABLET | Freq: Every day | ORAL | Status: DC
  Administered 2022-11-21: 20 mg via ORAL
  Filled 2022-11-21: qty 1

## 2022-11-21 MED ORDER — PREDNISONE 1 MG PO TABS
2.0000 mg | ORAL_TABLET | Freq: Every day | ORAL | Status: DC
  Administered 2022-11-22: 2 mg via ORAL
  Filled 2022-11-21: qty 2

## 2022-11-21 NOTE — Progress Notes (Signed)
PROGRESS NOTE Heidi Dominguez  ZOX:[id number] DOB: [birth year] DOA: 11/17/2022 PCP: Merlene Laughter, MD  Brief Narrative/Hospital Course: 89 y.o.f w/ significant medical history including HFpEF, dysphagia, chronic hypoxic respiratory failure, COPD, normocytic anemia T2DM, GERD with large hiatal hernia,HTN, HLD and recent admission for right shoulder PJI complicated by acute CHF and hyponatremia who was admitted from 3/22-4/5 and discharged to skilled nursing facility seen back from the skilled nursing facility due to shortness of breath chest pain and hypoxia  She was given nitroglycerin and sent back.  In the ED denied chest pain, was complaining of wheezing and coughing more No fever chills vision changes headache, nausea vomiting diarrhea dysuria.  Noted leg swelling but stated that the same as before. In the ED hemodynamically stable, 92% on 2 L nasal cannula labs with hemoglobin 8.8 BNP 281, troponin negative x 2, blood gas with pH 7.4 pCO2 65 pO2 60, mild hyponatremia 134 CBC with WBC 10.0.  Significant studies  CXR: stable mild to moderate severity cardiac silhouette enlargement.Large hiatal hernia and adjacent left basilar atelectasis.  CTA chest: single small branch point arterial embolism in posterior right lower lobe. No other embolus is seen. Prominent pulmonary trunk and main pulmonary arteries with reflux into the IVC and hepatic veins suggesting right heart dysfunction.  Cardiomegaly, increased pericardial effusion, Small layering pleural effusion with adjacent atelectasis or consolidation in lower lobes  Large hiatal hernia with inverted intrathoracic stomach, mild body wall edema.  4.4x3.3cm right thyroid nodule, biopsied in 2017 CT head was integrated, no acute finding Patient was given IV Lasix placed on Eliquis and admitted for further management   Subjective: Seen and examined, Overnight vitals/labs/events reviewed  She denied any complaints Has wheezing Daughter later arrived  asking questions: for low sodium.    Assessment and Plan: Principal Problem:   Acute on chronic respiratory failure with hypoxia Active Problems:   Pulmonary embolism   COPD with acute exacerbation   Acute on chronic diastolic CHF (congestive heart failure)   Right shoulder prosthetic joint infection   Severe aortic stenosis   Confusion   Diabetes mellitus type 2, controlled   Essential hypertension   CKD (chronic kidney disease) stage 3, GFR 30-59 ml/min   Gastroesophageal reflux disease   Normocytic anemia   Acute pulmonary embolism   Acute on chronic respiratory failure with hypoxia and hypercapnea Wheezing, concerning for acute COPD exacerbation: Hypoxia and wheezing likely multifactorial with PE, concern for COPD exacerbation, also CHF exacerbation / severe aortic stenosis .  She does seem to have chronic wheezing- she has intrathoracic inverted stomach, at risk for aspiration and chronic lung disease.   Cont on Pulmicort nebulizer, po Lasix, oral prednisone> taper to 20 mg to cont for next 4 days Cont mucinex. Procalcitonin, respiratory virus panel, COVID and flu negative.   Cont PTOT  Acute pulmonary embolism:CT angio noted single small branch point arterial embolism within the posterior right lower lobe.  Of note patient was appropriately on DVT prophylaxis with heparin 5000 units q 8hr.Unclear etiology.Eliquis started after discussion with family about AC and fall risk, we will continue  same, doppler negative for DVT, echo shows EF 60 to 65%, small pericardial effusion unchanged from 3/28, no RWMA.  Hemoccult pending  Acute on chronic diastolic CHF exacerbation Severe aortic stenosis Small pericardial effusion stable Lower leg edema somewhat chronic: Has lower leg edema-but per patient has been there before, no significant JVD noted.  Leg edema appears improving.  Will change IV to oral Lasix home dose.  Compression of the legs as tolerated.Limited echo showed a stable  pericardial effusion which is small [employer]   11/18/22 0935 11/19/22 0538 11/21/22 0432  Weight: 84.1 kg 81.7 kg 84.8 kg   Right shoulder prosthetic joint infection with E. Coli: At under gone IR aspiration and evaluation by Ortho and ID not a candidate for operative removal of hardware or debridement, plan was to continue 6 weeks of cefadroxil as outpatient and lifelong suppressive therapy after that- cont same  Altered mental status reportedly confused in ED-but on admission was oriented-see appears at baseline.  But hard of hearing CT head no acute finding mild hypercapnia on VBG.  Much more alert awake daughter thinks she is not back to 100% but overall she is communicative interactive no complaints.    GERD Large hiatal hernia with inverted intrathoracic stomach Dysphagia: Recently seen by SLP/G. She has had esophageal narrowing -was seen by [facility] she is at risk for aspiration related lung disease continue to encourage SLP, continue diet in relation to her hiatal hernia-keep upright 2 hours post prandial, encourage small frequent meals.GI had seen her previously-EGD was not possible due to risk of sedation and risk of dilatation. Cont  PPI, not a candidate for surgical intervention for hernia.  Type 2 diabetes mellitus A1c 7.4 recently, metformin on hold> continue Levemir 5u, SSI while here. Recent Labs  Lab 11/19/22 2148 11/20/22 0718 11/20/22 1119 11/20/22 1700 11/21/22 0719  GLUCAP 228* 152* 218* 357* 181*     Essential hypertension>cont  Norvasc at lower dose of 5mg  while on iv lasix, cont to monitor BP stable.  Hyponatremia previously seen by nephrology suspected in the setting of hypervolemia and SIADH, corrected sodium 128 previously she was up to 124, changing IV to oral Lasix monitor.  Seems to be tolerating. Recent Labs  Lab 11/17/22 0930 11/17/22 2027 11/19/22 0225 11/20/22 0157 11/21/22 0629  NA 134* 134* 130* 128* 126*    Normocytic anemia hemoglobin  holding in 8-9 g range for several days previously was up to 10-11- unclear if that was hemoconcentrated value.  Per family request Hemoccult has been ordered-since no obvious active bleeding unlikely it will change any management- of note on eliquis Recent Labs  Lab 11/17/22 0930 11/17/22 2027 11/19/22 0225 11/20/22 0157 11/21/22 0629  HGB 8.8* 9.0* 8.7* 8.7* 8.9*  HCT 29.3* 30.0* 28.8* 28.7* 29.3*     Goals of care: DNR overall prognosis does not appear bright. Seen by palliative care in 2022 Strongly encouraged patient's daughter to consider outpatient palliative care at facility, [facility] has been consulted to see if palliative care can be set up at the facility  Patient family has been informed high risk of readmissions, decompensation given her multiple comorbidities, advanced age.  Class I Obesity:Patient's Body mass index is 32.09 kg/m. : Will benefit with PCP follow-up, weight loss  healthy lifestyle and outpatient sleep evaluation.  Deconditioning/debility:cont PT OT Ambulatory dysfunction: Getting PT OT evaluation.   DVT prophylaxis: eliquis Code Status:   Code Status: DNR Family Communication: plan of care discussed with patient at bedside. I had discussed with the daughter at the bedside previously   Level of care: Telemetry Medical  Dispo: The patient is from: NH            Anticipated disposition: Back to nursing home likely tomorrow   Objective: Vitals last 24 hrs: Vitals:   11/20/22 2023 11/20/22 2200 11/21/22 0432 11/21/22 0812  BP:  (!) 142/62 (!) 160/74 132/64  Pulse:  86 88 85  Resp:  17 18 15   Temp:  98.2 F (36.8 C) 98.5 F (36.9 C) 98.6 F (37 C)  TempSrc:  Oral Oral Oral  SpO2: 95% 98% 100% 99%  Weight:   84.8 kg   Height:       Weight change:   Physical Examination: General exam: alert awake, pleasant elderly appears weak and deconditioned HEENT:Oral mucosa moist, Ear/Nose WNL grossly Respiratory system: Bilaterally wheezing present appears to  be coming from throat area, no use of accessory muscle Cardiovascular system: S1 & S2 +, No JVD. Gastrointestinal system: Abdomen soft,NT,ND, BS+ Nervous System: Alert, awake, moving extremities, shefollows commands. Extremities: LE edema +,distal peripheral pulses palpable.  Skin: No rashes,no icterus. MSK: thin/weak muscle bulk,tone, power  Medications reviewed:  Scheduled Meds:  amLODipine  5 mg Oral Daily   apixaban  10 mg Oral BID   Followed by   Melene Muller ON 11/25/2022] apixaban  5 mg Oral BID   budesonide  0.5 mg Nebulization BID   cefadroxil  1,000 mg Oral BID   Followed by   Melene Muller ON 12/16/2022] cefadroxil  500 mg Oral BID   dextromethorphan-guaiFENesin  1 tablet Oral BID   feeding supplement (GLUCERNA SHAKE)  237 mL Oral BID BM   furosemide  20 mg Oral Daily   insulin aspart  0-9 Units Subcutaneous TID WC   insulin detemir  5 Units Subcutaneous Daily   loratadine  10 mg Oral QHS   multivitamin with minerals  1 tablet Oral Daily   pantoprazole  40 mg Oral Daily   polyvinyl alcohol  2 drop Both Eyes Daily   predniSONE  40 mg Oral Q breakfast   sodium chloride flush  3 mL Intravenous Q12H  Continuous Infusions:  sodium chloride      Diet Order             Diet Carb Modified Fluid consistency: Thin; Room service appropriate? Yes  Diet effective now                   Intake/Output Summary (Last 24 hours) at 11/21/2022 1101 Last data filed at 11/21/2022 0900 Gross per 24 hour  Intake 700 ml  Output --  Net 700 ml    Net IO Since Admission: -1,360 mL [11/21/22 1101]  Wt Readings from Last 3 Encounters:  11/21/22 84.8 kg  11/03/22 86.5 kg  10/25/22 78 kg     Unresulted Labs (From admission, onward)     Start     Ordered   11/20/22 0500  Basic metabolic panel  Daily,   R      11/19/22 1128   11/20/22 0500  CBC  Daily,   R      11/19/22 1128   11/18/22 1040  Expectorated Sputum Assessment w Gram Stain, Rflx to Resp Cult  (COPD / Pneumonia / Cellulitis / Lower  Extremity Wound)  Once,   R        11/18/22 1045   Unscheduled  Occult blood card to lab, stool  As needed,   R      11/19/22 1404          Data Reviewed: I have personally reviewed following labs and imaging studies CBC: Recent Labs  Lab 11/15/22 0355 11/16/22 0357 11/17/22 0930 11/17/22 2027 11/19/22 0225 11/20/22 0157 11/21/22 0629  WBC 10.7*   < > 7.3 10.1 8.2 10.2 9.8  NEUTROABS 8.7*  --   --   --   --   --   --  HGB 8.8*   < > 8.8* 9.0* 8.7* 8.7* 8.9*  HCT 27.7*   < > 29.3* 30.0* 28.8* 28.7* 29.3*  MCV 86.6   < > 90.7 89.8 88.1 88.3 88.0  PLT 343   < > 373 382 375 357 341   < > = values in this interval not displayed.    Basic Metabolic Panel: Recent Labs  Lab 11/15/22 0355 11/16/22 0357 11/17/22 0930 11/17/22 2027 11/18/22 1429 11/19/22 0225 11/20/22 0157 11/21/22 0629  NA 128*   < > 134* 134*  --  130* 128* 126*  K 4.4   < > 4.5 4.7  --  4.2 4.3 4.3  CL 86*   < > 87* 87*  --  81* 83* 79*  CO2 34*   < > 38* 37*  --  40* 38* 38*  GLUCOSE 158*   < > 187* 165*  --  245* 201* 165*  BUN 23   < > 16 15  --  9 9 13   CREATININE 0.80   < > 0.55 0.59  --  0.52 0.54 0.54  CALCIUM 9.2   < > 9.3 9.5  --  9.3 9.0 8.9  MG  --   --   --   --  2.0  --   --   --   PHOS 2.8  --   --   --   --   --   --   --    < > = values in this interval not displayed.    GFR: Estimated Creatinine Clearance: 40.4 mL/min (by C-G formula based on SCr of 0.54 mg/dL). Liver Function Tests: Recent Labs  Lab 11/15/22 0355 11/16/22 0357 11/17/22 2027  AST  --  19 17  ALT  --  21 16  ALKPHOS  --  62 63  BILITOT  --  0.5 0.5  PROT  --  6.1* 6.5  ALBUMIN 2.2* 2.1* 2.2*    Recent Labs  Lab 11/17/22 2027  LIPASE 20    Recent Labs  Lab 11/15/22 0355  AMMONIA 30   Coagulation Profile: Recent Labs  Lab 11/19/22 0225  INR 1.2   BNP (last 3 results) Recent Labs    09/27/22 1218  PROBNP 77.0   HbA1C: No results for input(s): "HGBA1C" in the last 72 hours. Recent Labs   Lab 11/18/22 1429 11/19/22 0225  PROCALCITON <0.10 <0.10     Recent Results (from the past 240 hour(s))  Resp panel by RT-PCR (RSV, Flu A&B, Covid) Urine, Catheterized     Status: None   Collection Time: 11/17/22 11:56 PM   Specimen: Urine, Catheterized; Nasal Swab  Result Value Ref Range Status   SARS Coronavirus 2 by RT PCR NEGATIVE NEGATIVE Final   Influenza A by PCR NEGATIVE NEGATIVE Final   Influenza B by PCR NEGATIVE NEGATIVE Final    Comment: (NOTE) The Xpert Xpress SARS-CoV-2/FLU/RSV plus assay is intended as an aid in the diagnosis of influenza from Nasopharyngeal swab specimens and should not be used as a sole basis for treatment. Nasal washings and aspirates are unacceptable for Xpert Xpress SARS-CoV-2/FLU/RSV testing.  Fact Sheet for Patients: BloggerCourse.com  Fact Sheet for Healthcare Providers: SeriousBroker.it  This test is not yet approved or cleared by the [place] FDA and has been authorized for detection and/or diagnosis of SARS-CoV-2 by FDA under an Emergency Use Authorization (EUA). This EUA will remain in effect (meaning this test can be used) for the duration of the COVID-19  declaration under Section 564(b)(1) of the Act, 21 U.S.C. section 360bbb-3(b)(1), unless the authorization is terminated or revoked.     Resp Syncytial Virus by PCR NEGATIVE NEGATIVE Final    Comment: (NOTE) Fact Sheet for Patients: BloggerCourse.comhttps://www.fda.gov/media/152166/download  Fact Sheet for Healthcare Providers: SeriousBroker.ithttps://www.fda.gov/media/152162/download  This test is not yet approved or cleared by the [place] FDA and has been authorized for detection and/or diagnosis of SARS-CoV-2 by FDA under an Emergency Use Authorization (EUA). This EUA will remain in effect (meaning this test can be used) for the duration of the COVID-19 declaration under Section 564(b)(1) of the Act, 21 U.S.C. section 360bbb-3(b)(1), unless  the authorization is terminated or revoked.  Performed at [facility], [address] [place], [place] [address]   Respiratory (~20 pathogens) panel by PCR     Status: None   Collection Time: 11/18/22 10:40 AM   Specimen: Nasopharyngeal Swab; Respiratory  Result Value Ref Range Status   Adenovirus NOT DETECTED NOT DETECTED Final   Coronavirus 229E NOT DETECTED NOT DETECTED Final    Comment: (NOTE) The Coronavirus on the Respiratory Panel, DOES NOT test for the novel  Coronavirus (2019 nCoV)    Coronavirus HKU1 NOT DETECTED NOT DETECTED Final   Coronavirus NL63 NOT DETECTED NOT DETECTED Final   Coronavirus OC43 NOT DETECTED NOT DETECTED Final   Metapneumovirus NOT DETECTED NOT DETECTED Final   Rhinovirus / Enterovirus NOT DETECTED NOT DETECTED Final   Influenza A NOT DETECTED NOT DETECTED Final   Influenza B NOT DETECTED NOT DETECTED Final   Parainfluenza Virus 1 NOT DETECTED NOT DETECTED Final   Parainfluenza Virus 2 NOT DETECTED NOT DETECTED Final   Parainfluenza Virus 3 NOT DETECTED NOT DETECTED Final   Parainfluenza Virus 4 NOT DETECTED NOT DETECTED Final   Respiratory Syncytial Virus NOT DETECTED NOT DETECTED Final   Bordetella pertussis NOT DETECTED NOT DETECTED Final   Bordetella Parapertussis NOT DETECTED NOT DETECTED Final   Chlamydophila pneumoniae NOT DETECTED NOT DETECTED Final   Mycoplasma pneumoniae NOT DETECTED NOT DETECTED Final    Comment: Performed at [facility], [address] [place], [place] [address]    Antimicrobials: Anti-infectives (From admission, onward)    Start     Dose/Rate Route Frequency Ordered Stop   12/16/22 1000  cefadroxil (DURICEF) capsule 500 mg       See Hyperspace for full Linked Orders Report.   500 mg Oral 2 times daily 11/19/22 1226     11/19/22 1330  cefadroxil (DURICEF) capsule 1,000 mg       See Hyperspace for full Linked Orders Report.   1,000 mg Oral 2 times daily 11/19/22 1226 12/16/22 0959   11/18/22 1200   ceFEPIme (MAXIPIME) 2 g in sodium chloride 0.9 % 100 mL IVPB  Status:  Discontinued        2 g 200 mL/hr over 30 Minutes Intravenous Every 12 hours 11/18/22 1045 11/19/22 1224      Culture/Microbiology    Component Value Date/Time   SDES ABSCESS 11/04/2022 1243   SPECREQUEST DRAIN 2 RIGHT ANTERIOR SHOULDER 11/04/2022 1243   CULT  11/04/2022 1243    FEW ESCHERICHIA COLI NO ANAEROBES ISOLATED Performed at [facility], [address] [place], [place] [address]    REPTSTATUS 11/17/2022 FINAL 11/04/2022 1243  Other culture-see note  Radiology Studies: No results found.   LOS: 2 days   Lanae Boastamesh Barbaraann Avans, MD [facility]  11/21/2022, 11:01 AM

## 2022-11-21 NOTE — TOC Progression Note (Signed)
Transition of Care [facility]) - Initial/Assessment Note    Patient Details  Name: Heidi Dominguez MRN: [id number] Date of Birth: Jan 09, 1921  [facility]) CM/SW Contact:    Ralene Bathe, LCSWA Phone Number: 11/21/2022, 11:53 AM  Clinical Narrative:                 LCSW spoke with patient's daughter and discussed disposition.  LCSW submitted for insurance authorization for patient to receive skilled PT services at [facility] upon return at the request of the patient's daughter.  Patient is long term care at the facility therefore insurance authorization approval is not needed prior to the patient returning.  This information was relayed to the patient's daughter, [name], by LCSW on this day.   LCSW received a call from [facility].  The insurance authorization request has been submitted to the medical director at [facility] for review.  LCSW received a consult for outpatient palliative and spoke with the patient's daughter.  The family requested more information prior to agreeing to outpatient palliative.  This Clinical research associate requested that the attending add an order for palliative to see the patient.   TOC following.  Patient Goals and CMS Choice            Expected Discharge Plan and Services                                              Prior Living Arrangements/Services                       Activities of Daily Living      Permission Sought/Granted                  Emotional Assessment              Admission diagnosis:  Hiatal hernia [K44.9] Precordial chest pain [R07.2] Hypoalbuminemia [E88.09] Pulmonary embolism [I26.99] Chronic anemia [D64.9] History of vomiting [Z87.898] Acute pulmonary embolism [I26.99] Patient Active Problem List   Diagnosis Date Noted   Acute pulmonary embolism 11/19/2022   Pulmonary embolism 11/18/2022   Severe aortic stenosis 11/18/2022   Confusion 11/18/2022   Acute on chronic diastolic CHF  (congestive heart failure) 11/15/2022   Normocytic anemia 11/15/2022   Obesity (BMI 30-39.9) 11/15/2022   Acute on chronic respiratory failure with hypoxia 09/28/2022   Bilateral lower extremity edema 09/28/2022   COPD with acute exacerbation 09/27/2022   Osteomyelitis 01/12/2021   Peripheral artery disease 12/22/2020   Diabetic foot ulcer 09/02/2020   Diabetic neuropathy 09/02/2020   Gait abnormality 09/02/2020   Diabetic retinopathy associated with type 2 diabetes mellitus 09/02/2020   Hardening of the aorta (main artery of the heart) 09/02/2020   Thrombocytopenia 09/02/2020   Gastroesophageal reflux disease 10/22/2019   Unspecified chronic bronchitis 09/02/2019   Rhinitis, chronic 01/07/2019   Right shoulder prosthetic joint infection 11/08/2018   History of hemiarthroplasty of right shoulder 09/25/2018   Acute pancreatitis 08/28/2018   Acute recurrent pancreatitis    Abdominal pain 08/23/2018   Elevated LFTs 07/04/2018   CKD (chronic kidney disease) stage 3, GFR 30-59 ml/min 07/04/2018   Upper airway cough syndrome 03/13/2018   Sensorineural hearing loss (SNHL), bilateral 12/04/2016   Post-nasal drainage 03/14/2016   Presbycusis of both ears 03/14/2016   Bilateral hearing loss 10/06/2015   Bilateral impacted cerumen 10/06/2015  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   Subjective tinnitus of both ears 10/06/2015   Choledocholithiasis with chronic cholecystitis, nonobstructing 04/11/2015   Pre-operative cardiovascular examination, high risk surgery    Epigastric abdominal pain 04/08/2015   Acute gallstone pancreatitis s/p lap chole w IOC 04/11/2015 04/08/2015   Diabetes mellitus type 2, controlled 04/08/2015   Gallstone pancreatitis 04/08/2015   Mitral regurgitation 04/08/2015   Chronic diastolic CHF (congestive heart failure) 04/08/2015   DOE (dyspnea on exertion) 03/31/2015   Ischemic colitis (HCC) 08/09/2012   Colitis 08/07/2012   Rectal bleeding  08/07/2012   Weakness 09/24/2011   Bradycardia 09/24/2011   Acute lower UTI 09/24/2011   Hematochezia 09/24/2011   Essential hypertension 09/24/2011   PCP:  Merlene Laughter, MD Pharmacy:   [facility] [address], [place] - [address] [place] [address] Phone: [phone] Fax: [phone]  [id number] - [place], [place] - [address] [place] [address] Phone: [phone] Fax: [phone]     Social Determinants of Health (SDOH) Social History: SDOH Screenings   Food Insecurity: No Food Insecurity (11/03/2022)  Housing: Low Risk  (11/03/2022)  Transportation Needs: No Transportation Needs (11/03/2022)  Utilities: Not At Risk (11/03/2022)  Depression (PHQ2-9): Low Risk  (02/09/2021)  Tobacco Use: Low Risk  (11/04/2022)   SDOH Interventions:     Readmission Risk Interventions     No data to display

## 2022-11-21 NOTE — Plan of Care (Signed)
  Problem: Coping: Goal: Ability to adjust to condition or change in health will improve Outcome: Progressing   

## 2022-11-21 NOTE — Progress Notes (Signed)
   Palliative Medicine Inpatient Follow Up Note   The PMT has received the consultation for Heidi Dominguez.  Have communicated via secure chat with both the attending, Dr. Jonathon Bellows and MSW, Reandra who agree OP Palliative will be ordered at [facility].  At this time no new for inpatient consultation.  No Charge ______________________________________________________________________________________ [name] Palliative Medicine Team Team Cell Phone: [phone] Please utilize secure chat with additional questions, if there is no response within 30 minutes please call the above phone number  Palliative Medicine Team providers are available by phone from 7am to 7pm daily and can be reached through the team cell phone.  Should this patient require assistance outside of these hours, please call the patient's attending physician.

## 2022-11-22 ENCOUNTER — Telehealth: Payer: Self-pay

## 2022-11-22 DIAGNOSIS — J9621 Acute and chronic respiratory failure with hypoxia: Secondary | ICD-10-CM | POA: Diagnosis not present

## 2022-11-22 LAB — BASIC METABOLIC PANEL
Anion gap: 11 (ref 5–15)
BUN: 16 mg/dL (ref 8–23)
CO2: 39 mmol/L — ABNORMAL HIGH (ref 22–32)
Calcium: 9 mg/dL (ref 8.9–10.3)
Chloride: 77 mmol/L — ABNORMAL LOW (ref 98–111)
Creatinine, Ser: 0.57 mg/dL (ref 0.44–1.00)
GFR, Estimated: >60 mL/min (ref >60)
Glucose, Bld: 213 mg/dL — ABNORMAL HIGH (ref 70–99)
Potassium: 4.5 mmol/L (ref 3.5–5.1)
Sodium: 127 mmol/L — ABNORMAL LOW (ref 135–145)

## 2022-11-22 LAB — CBC
HCT: 28 % — ABNORMAL LOW (ref 36.0–46.0)
Hemoglobin: 8.8 g/dL — ABNORMAL LOW (ref 12.0–15.0)
MCH: 27.7 pg (ref 26.0–34.0)
MCHC: 31.4 g/dL (ref 30.0–36.0)
MCV: 88.1 fL (ref 80.0–100.0)
Platelets: 311 10*3/uL (ref 150–400)
RBC: 3.18 MIL/uL — ABNORMAL LOW (ref 3.87–5.11)
RDW: 16.6 % — ABNORMAL HIGH (ref 11.5–15.5)
WBC: 9.9 10*3/uL (ref 4.0–10.5)
nRBC: 0 % (ref 0.0–0.2)

## 2022-11-22 LAB — GLUCOSE, CAPILLARY
Glucose-Capillary: 179 mg/dL — ABNORMAL HIGH (ref 70–99)
Glucose-Capillary: 227 mg/dL — ABNORMAL HIGH (ref 70–99)

## 2022-11-22 LAB — OCCULT BLOOD X 1 CARD TO LAB, STOOL: Fecal Occult Bld: NEGATIVE

## 2022-11-22 MED ORDER — FUROSEMIDE 40 MG PO TABS
40.0000 mg | ORAL_TABLET | Freq: Every day | ORAL | Status: DC
  Administered 2022-11-22: 40 mg via ORAL
  Filled 2022-11-22: qty 1

## 2022-11-22 MED ORDER — ACETAZOLAMIDE 125 MG PO TABS: 125.0000 mg | ORAL_TABLET | Freq: Two times a day (BID) | ORAL | Status: DC

## 2022-11-22 MED ORDER — APIXABAN 5 MG PO TABS: 5.0000 mg | ORAL_TABLET | Freq: Two times a day (BID) | ORAL | 3 refills | Status: DC

## 2022-11-22 MED ORDER — CEFADROXIL 500 MG PO CAPS: 1000.0000 mg | ORAL_CAPSULE | Freq: Two times a day (BID) | ORAL | 0 refills | Status: DC

## 2022-11-22 MED ORDER — AMLODIPINE BESYLATE 5 MG PO TABS: 5.0000 mg | ORAL_TABLET | Freq: Every day | ORAL | Status: DC

## 2022-11-22 MED ORDER — APIXABAN 5 MG PO TABS: 10.0000 mg | ORAL_TABLET | Freq: Two times a day (BID) | ORAL | Status: DC

## 2022-11-22 MED ORDER — PREDNISONE 1 MG PO TABS: 2.0000 mg | ORAL_TABLET | Freq: Every day | ORAL | 0 refills | Status: DC

## 2022-11-22 MED ORDER — FUROSEMIDE 40 MG PO TABS: 40.0000 mg | ORAL_TABLET | Freq: Every day | ORAL | 0 refills | Status: DC

## 2022-11-22 MED ORDER — CEFADROXIL 500 MG PO CAPS: 500.0000 mg | ORAL_CAPSULE | Freq: Two times a day (BID) | ORAL | 12 refills | Status: DC

## 2022-11-22 NOTE — Progress Notes (Signed)
DISCHARGE NOTE HOME Heidi Dominguez to be discharged Skilled nursing facility per MD order. Discussed prescriptions and follow up appointments with the patient and staff at [facility]. AVS given to staff from [facility]  Skin clean, dry and intact without evidence of skin break down, no evidence of skin tears noted. IV catheter discontinued intact. Site without signs and symptoms of complications. Dressing and pressure applied. Pt denies pain at the site currently. No complaints noted.  Patient free of lines, drains, and wounds.   An After Visit Summary (AVS) was printed and given to the patient. Patient escorted via wheelchair, and discharged home via private auto.  [name], RN

## 2022-11-22 NOTE — Progress Notes (Signed)
O2 sat at rest on R/A 81%, pt. Placed on 2 L via N/C and O2 sat improving to 93%, unable to  obtain ambulatory oxygen saturation, pt. Unable to ambulate  Heidi Dominguez

## 2022-11-22 NOTE — Discharge Summary (Addendum)
Physician Discharge Summary  Heidi BurkeCora S Dominguez WUJ:[id number] DOB: 05-Oct-1920 DOA: 11/17/2022  PCP: Merlene LaughterStoneking, Hal, MD  Admit date: 11/17/2022 Discharge date: 11/22/2022  Time spent: 55 minutes  Recommendations for Outpatient Follow-up:  Needs interval chest x-ray in about 1 week continue clearing of effusions continue Lasix as per prescriptions for about 10 days and then dose adjust/monitor fluid status in the outpatient setting with labs in about 1 week Require lifelong oxygen at time of discharge New PE this admission requires dosage adjustments of Eliquis on discharge as [facility] Family is not interested in palliative care but may need to have this discussion if continues to decline--Family has been informed about high risk of readmission admission and high risk of decompensation and is DNR Continue lifelong antibiotics [facility] given prosthetic joint infection of right shoulder as per recommendations from infectious disease Dr. Jarold SongM Singh-- -cefadroxil 1gm bid x 6 weeks form OR EOT 11/15/22, then transition to cefadroxil 500 mg po bid for indefinite suppression given hardware in place.    Discharge Diagnoses:  MAIN problem for hospitalization   Multifactorial dyspnea secondary to volume overload, COPD and pulmonary embolism Acute small branch pulmonary embolism posterior right lobe Acute diastolic heart failure with severe aortic stenosis PGI E. coli of right shoulder prosthetic joint requiring lifelong suppression and treatment Large hiatal hernia and dysphagia SIADH  Please see below for itemized issues addressed in HOpsital- refer to other progress notes for clarity if needed  Discharge Condition: Guarded  Diet recommendation: Limit fluids to 1500 cc diabetic heart healthy diet  Filed Weights   11/19/22 0538 11/21/22 0432 11/22/22 0500  Weight: 81.7 kg 84.8 kg 85.4 kg    History of present illness:  89 [place] resident, usually uses walker but now more bedbound  (some annotation states that she has been bedbound for 2 years) HFrEF EF 65% 11/09/2022, underlying upper airway disease with reactive airway component Large hiatal hernia with reflux CKD 3 AA HTN HLD history of peripheral vascular disease status post tibial stent with great toe osteomyelitis Prior bilateral shoulder repairs Prior right septic shoulder Recent RSV infection on hospitalization 2/15 through 2/19--that admission she was treated for superimposed bronchitis in the setting of large hiatal hernia causing chronic underlying aspiration [facility] was consulted and discussed by the physician at that time and they did not feel it would be safe to do an upper endoscopy on her-goals of care were discussed with the family at that time and patient remained full code  Patient hospitalized again 3/22 through 11/17/2022 with PJI infection right shoulder necessitating drain placement right shoulder which grew E. coli ID consulted and recommended cefadroxil treatment doses and then lifelong suppression-nephrology consulted for underlying hyponatremia felt to be combination of SIADH tea toast potomania-patient not a candidate for operative hardware debridement Patient remained very poor candidate for EGD-patient also has made precipitous decline per notes and had underlying delirium as well as exacerbation of this likely contributed by in part at least by her very difficult comprehension of what was going on because of her presbycusis  Discharged only to return almost immediately on 4/6 with shortness of breath, chest pain, hypoxia In ED hemodynamically stable 92% on 2 L hemoglobin 8 BNP 281 troponins negative blood gas    She was found to have pulmonary embolism quite small but prominent pulmonary ttrunk dilatation suggestive of right heart dysfunction and increasing layering pleural effusions bilaterally    Hospital Course:  Multifactorial dyspnea-acute PE upper airway disease as well  as  hiatal hernia which is large Patient will need diuresis and low-dose steroids at discharge-she will require reevaluation of volume status as we are discharging her with home Lasix She had a small pericardial effusion that was not felt to be amenable to procedure and will require lower extremity compression hose We started Eliquis and she will convert to lower dosing after the requisite 7 days per [facility] Note that all URI workup was negative  Severe aortic stenosis small pericardial effusion HFpEF She is poor candidate for any procedure Continue amlodipine cautiously  Large hiatal hernia reflux dysphagia and prior Schatzki ring Last admission seen by [facility] Dr. Lorenso Quarry and was not felt to be a good candidate for operative/mechanical intervention given hypoxia Best efforts to keep patient sitting upright 2 hours postprandially to ensure no aspiration, remains high risk for this SLP to follow-up as outpatient as able  Concern for encephalopathy on admission Likely related to her severe presbycusis-intermittently answers questions appropriately-seems improved compared to prior discussions Could be related to metabolic alkalosis as well see below  SIADH hypervolemia Continue Lasix, fluid restrict to 1500 cc as able in the outpatient setting and give solute with diet as well as fluids do not allow free water Will need labs in a week  Right shoulder prosthetic infection Needs outpatient follow-up with both Ortho Dr. Devonne Doughty as well as ID Dr. Thedore Mins to determine next steps-Will need lifelong suppressive therapy once acute therapy is complete on 12/15/2022  DM TY 2 Given advanced age and risk for acidemia I have discontinued metformin May continue sliding scale as well as low-dose Levemir only on discharge  Normocytic anemia hemoglobin stable and no Hemoccult positive  Underlying secondary metabolic alkalosis as a compensation for her chronic respiratory issues Potentially related to fluid shifts?   In addition  will need outpatient follow-up with nephrology-placing on Diamox 125 twice daily and will need labs in the outpatient setting   I have seen this patient today-I have discussed my concerns about this patient being 89 years old and that she is at very high risk for readmission-I have signed the DNR and discussed clearly with the daughter that regard less of our wishes, patient will probably continue to decline-daughter refuses to pursue palliative care in the outpatient setting-I have explained to her again that given her multiple hospitalizations in the past 2 months, worsening frailty etc. that this will probably recur-she understood  Discharge Exam: Vitals:   11/22/22 0803 11/22/22 0909  BP:  (!) 142/69  Pulse:  87  Resp:  18  Temp:  97.7 F (36.5 C)  SpO2: 100% 100%    Subj on day of d/c   Coherent alert no distress very hard of hearing Neck soft supple She does have mild wheeze posterolaterally She has grade 2 lower extremity edema Abdomen soft S1-S2 Moves 4 limbs equally however restricted ROM to right shoulder >left--wounds examined on the right shoulder do not appear to be significant  Discharge Instructions   Discharge Instructions     Diet - low sodium heart healthy   Complete by: As directed    Increase activity slowly   Complete by: As directed       Allergies as of 11/22/2022       Reactions   Augmentin [amoxicillin-pot Clavulanate] Diarrhea   Bactrim [sulfamethoxazole-trimethoprim] Diarrhea, Itching   Calan [verapamil] Diarrhea   Catapres [clonidine Hcl] Other (See Comments)   Unknown reaction Not documented on MAR   Codeine Other (See Comments)   "  Makes me out of my head, loopy"   Hydrocodone Other (See Comments)   "makes me go out of my head, loopy"   Other Other (See Comments)   Any type of narcotics Feels "loopy"   Sulfa Antibiotics Itching   Zestril [lisinopril] Cough   Azulfidine [sulfasalazine] Itching        Medication  List     STOP taking these medications    aspirin EC 81 MG tablet   Cinnamon 500 MG capsule   ipratropium-albuterol 0.5-2.5 (3) MG/3ML Soln Commonly known as: DUONEB   loratadine 10 MG tablet Commonly known as: CLARITIN   metFORMIN 500 MG tablet Commonly known as: GLUCOPHAGE       TAKE these medications    acetaminophen 325 MG tablet Commonly known as: TYLENOL Take 650 mg by mouth 3 (three) times daily.   acetaZOLAMIDE 125 MG tablet Commonly known as: DIAMOX Take 1 tablet (125 mg total) by mouth 2 (two) times daily.   albuterol 108 (90 Base) MCG/ACT inhaler Commonly known as: VENTOLIN HFA Inhale 2 puffs into the lungs every 6 (six) hours as needed for wheezing or shortness of breath.   amLODipine 5 MG tablet Commonly known as: NORVASC Take 1 tablet (5 mg total) by mouth daily. Start taking on: November 23, 2022 What changed:  medication strength how much to take additional instructions   apixaban 5 MG Tabs tablet Commonly known as: ELIQUIS Take 2 tablets (10 mg total) by mouth 2 (two) times daily for 3 days.   apixaban 5 MG Tabs tablet Commonly known as: ELIQUIS Take 1 tablet (5 mg total) by mouth 2 (two) times daily. Start taking on: November 25, 2022   budesonide 0.5 MG/2ML nebulizer solution Commonly known as: Pulmicort Take 2 mLs (0.5 mg total) by nebulization in the morning and at bedtime.   cefadroxil 500 MG capsule Commonly known as: DURICEF Take 2 capsules (1,000 mg total) by mouth 2 (two) times daily for 23 days. What changed: Another medication with the same name was added. Make sure you understand how and when to take each.   cefadroxil 500 MG capsule Commonly known as: DURICEF Take 1 capsule (500 mg total) by mouth 2 (two) times daily. Start taking on: Dec 16, 2022 What changed: You were already taking a medication with the same name, and this prescription was added. Make sure you understand how and when to take each.    dextromethorphan-guaiFENesin 30-600 MG 12hr tablet Commonly known as: MUCINEX DM Take 1 tablet by mouth 2 (two) times daily.   furosemide 40 MG tablet Commonly known as: LASIX Take 1 tablet (40 mg total) by mouth daily. Start taking on: November 23, 2022 What changed:  medication strength how much to take   GenTeal Severe 0.3 % Gel ophthalmic ointment Generic drug: hypromellose Place 1 Application into both eyes 2 (two) times daily.   HumaLOG 100 UNIT/ML injection Generic drug: insulin lispro Inject 0-12 Units into the skin 3 (three) times daily before meals. Per sliding scale: If blood sugar is less than 70, call NP/PA. If blood sugar is 70 to 200, give 0 units. If blood sugar is 201 to 250, give 2 units. If blood sugar is 251 to 300, give 4 units. If blood sugar is 301 to 350, give 6 units. If blood sugar is 351 to 400, give 8 units. If blood sugar is 401 to 450, give 10 units. If blood sugar is 451 to 600, give 12 units. Recheck in 2 hours.  Levemir FlexPen 100 UNIT/ML FlexPen Generic drug: insulin detemir Inject 5 Units into the skin daily.   Lubricant Eye Drops 0.4-0.3 % Soln Generic drug: Polyethyl Glycol-Propyl Glycol Place 2 drops into both eyes daily.   OMEPRAZOLE PO Take 20 mg by mouth daily. Omeprazole 20 mg DR disintegrating tablet.   ondansetron 4 MG tablet Commonly known as: ZOFRAN Take 4 mg by mouth every 8 (eight) hours as needed for nausea or vomiting.   polyethylene glycol powder 17 GM/SCOOP powder Commonly known as: GLYCOLAX/MIRALAX Take 17 g by mouth at bedtime.   polyvinyl alcohol 1.4 % ophthalmic solution Commonly known as: LIQUIFILM TEARS Place 1 drop into both eyes in the morning, at noon, in the evening, and at bedtime. Following injection treatment   predniSONE 1 MG tablet Commonly known as: DELTASONE Take 2 tablets (2 mg total) by mouth daily with breakfast. Start taking on: November 23, 2022   PreserVision AREDS Caps Take 1 capsule by  mouth daily.   Probiotic Acidophilus Caps Take 1 capsule by mouth at bedtime.   Vitamin D3 25 MCG (1000 UT) capsule Generic drug: Cholecalciferol Take 1,000 Units by mouth every Monday.   Voltaren Arthritis Pain 1 % Gel Generic drug: diclofenac Sodium Apply 2 g topically 3 (three) times daily. To both shoulders.       Allergies  Allergen Reactions   Augmentin [Amoxicillin-Pot Clavulanate] Diarrhea   Bactrim [Sulfamethoxazole-Trimethoprim] Diarrhea and Itching   Calan [Verapamil] Diarrhea   Catapres [Clonidine Hcl] Other (See Comments)    Unknown reaction Not documented on MAR    Codeine Other (See Comments)    "Makes me out of my head, loopy"   Hydrocodone Other (See Comments)    "makes me go out of my head, loopy"   Other Other (See Comments)    Any type of narcotics Feels "loopy"    Sulfa Antibiotics Itching   Zestril [Lisinopril] Cough   Azulfidine [Sulfasalazine] Itching      The results of significant diagnostics from this hospitalization (including imaging, microbiology, ancillary and laboratory) are listed below for reference.    Significant Diagnostic Studies: VAS [place] LOWER EXTREMITY VENOUS (DVT)  Result Date: 11/19/2022  Lower Venous DVT Study Patient Name:  TIARIA BIBY  Date of Exam:   11/18/2022 Medical Rec #: [id number]        Accession #:    [id number] Date of Birth: October 07, 1920        Patient Gender: F Patient Age:   63 years Exam Location:  [facility] Procedure:      VAS [place] LOWER EXTREMITY VENOUS (DVT) Referring Phys: Orland Mustard --------------------------------------------------------------------------------  Indications: Pulmonary embolism, and Edema.  Limitations: Body habitus, poor ultrasound/tissue interface and edema, pain with compression. Comparison Study: Prior negative bilateral LEV done 03/16/2021 Performing Technologist: Sherren Kerns RVS  Examination Guidelines: A complete evaluation includes B-mode imaging, spectral Doppler, color  Doppler, and power Doppler as needed of all accessible portions of each vessel. Bilateral testing is considered an integral part of a complete examination. Limited examinations for reoccurring indications may be performed as noted. The reflux portion of the exam is performed with the patient in reverse Trendelenburg.  +---------+---------------+---------+-----------+----------+-------------------+ RIGHT    CompressibilityPhasicitySpontaneityPropertiesThrombus Aging      +---------+---------------+---------+-----------+----------+-------------------+ CFV      Full           Yes      Yes                                      +---------+---------------+---------+-----------+----------+-------------------+  SFJ      Full                                                             +---------+---------------+---------+-----------+----------+-------------------+ FV Prox  Full                                                             +---------+---------------+---------+-----------+----------+-------------------+ FV Mid                  Yes      Yes                  patent by color and                                                       Doppler             +---------+---------------+---------+-----------+----------+-------------------+ FV DistalFull           Yes      Yes                                      +---------+---------------+---------+-----------+----------+-------------------+ PFV      Full                                                             +---------+---------------+---------+-----------+----------+-------------------+ POP                                                   Not visualized      +---------+---------------+---------+-----------+----------+-------------------+ PTV                                                   patent by color and                                                       Doppler              +---------+---------------+---------+-----------+----------+-------------------+ PERO  Not well visualized +---------+---------------+---------+-----------+----------+-------------------+   +---------+---------------+---------+-----------+----------+-------------------+ LEFT     CompressibilityPhasicitySpontaneityPropertiesThrombus Aging      +---------+---------------+---------+-----------+----------+-------------------+ CFV      Full           Yes      Yes                                      +---------+---------------+---------+-----------+----------+-------------------+ SFJ      Full                                                             +---------+---------------+---------+-----------+----------+-------------------+ FV Prox  Full                                                             +---------+---------------+---------+-----------+----------+-------------------+ FV Mid   Full           Yes      Yes                                      +---------+---------------+---------+-----------+----------+-------------------+ FV DistalFull                                                             +---------+---------------+---------+-----------+----------+-------------------+ PFV      Full                                                             +---------+---------------+---------+-----------+----------+-------------------+ POP                     Yes      Yes                  patent by color and                                                       Doppler             +---------+---------------+---------+-----------+----------+-------------------+ PTV                                                   Not well visualized +---------+---------------+---------+-----------+----------+-------------------+ PERO  Not well visualized  +---------+---------------+---------+-----------+----------+-------------------+     Summary: RIGHT: - There is no evidence of deep vein thrombosis in the lower extremity. However, portions of this examination were limited- see technologist comments above.  LEFT: - There is no evidence of deep vein thrombosis in the lower extremity. However, portions of this examination were limited- see technologist comments above.  *See table(s) above for measurements and observations. Electronically signed by Waverly Ferrari MD on 11/19/2022 at 11:02:32 AM.    Final    ECHOCARDIOGRAM LIMITED  Result Date: 11/19/2022    ECHOCARDIOGRAM LIMITED REPORT   Patient Name:   KESSLER KOPINSKI Date of Exam: 11/19/2022 Medical Rec #:  [id number]       Height:       64.0 in Accession #:    [id number]      Weight:       180.1 lb Date of Birth:  [birth year]       BSA:          1.871 m Patient Age:    89 years        BP:           126/54 mmHg Patient Gender: F               HR:           96 bpm. Exam Location:  Inpatient Procedure: Limited Echo, Limited Color Doppler and Cardiac Doppler Indications:    Pericardial effusion I31.3  History:        Patient has prior history of Echocardiogram examinations, most                 recent 11/09/2022. P.E., Aortic Valve Disease and Mitral Valve                 Disease; Risk Factors:Hypertension, Diabetes, Non-Smoker and                 Dyslipidemia.  Sonographer:    Aron Baba Referring Phys: [id number] Orland Mustard  Sonographer Comments: Image acquisition challenging due to patient body habitus and Image acquisition challenging due to respiratory motion. IMPRESSIONS  1. Limted study for pericardial effusion; full doppler not performed; pericardial effusion remains small and unchanged compared to 11/09/22.  2. Left ventricular ejection fraction, by estimation, is 60 to 65%. The left ventricle has normal function. The left ventricle has no regional wall motion abnormalities. There is moderate concentric left  ventricular hypertrophy.  3. Right ventricular systolic function is mildly reduced. The right ventricular size is mildly enlarged.  4. Left atrial size was severely dilated.  5. A small pericardial effusion is present.  6. Mild mitral valve regurgitation. Severe mitral annular calcification. FINDINGS  Left Ventricle: Left ventricular ejection fraction, by estimation, is 60 to 65%. The left ventricle has normal function. The left ventricle has no regional wall motion abnormalities. The left ventricular internal cavity size was normal in size. There is  moderate concentric left ventricular hypertrophy. Right Ventricle: The right ventricular size is mildly enlarged. Right ventricular systolic function is mildly reduced. Left Atrium: Left atrial size was severely dilated. Right Atrium: Right atrial size was normal in size. Pericardium: A small pericardial effusion is present. Mitral Valve: Severe mitral annular calcification. Mild mitral valve regurgitation. Tricuspid Valve: The tricuspid valve is normal in structure. Tricuspid valve regurgitation is mild. IAS/Shunts: No atrial level shunt detected by color flow Doppler. Additional Comments: Limted study for pericardial effusion; full doppler not performed; pericardial effusion remains small and unchanged  compared to 11/09/22. Spectral Doppler performed. Color Doppler performed.  LEFT VENTRICLE PLAX 2D LVIDd:         3.60 cm LVIDs:         2.60 cm LV PW:         1.50 cm LV IVS:        1.20 cm  Olga Millers MD Electronically signed by Olga Millers MD Signature Date/Time: 11/19/2022/10:07:47 AM    Final    CT HEAD WO CONTRAST ( )  Result Date: 11/18/2022 CLINICAL DATA:  Provided history: Mental status change, unknown cause. EXAM: CT HEAD WITHOUT CONTRAST TECHNIQUE: Contiguous axial images were obtained from the base of the skull through the vertex without intravenous contrast. RADIATION DOSE REDUCTION: This exam was performed according to the departmental  dose-optimization program which includes automated exposure control, adjustment of the mA and/or kV according to patient size and/or use of iterative reconstruction technique. COMPARISON:  Head CT 11/02/2019. FINDINGS: Motion degraded examination, limiting evaluation. Brain: Generalized cerebral atrophy. Patchy and ill-defined hypoattenuation within the cerebral white matter, nonspecific but compatible with advanced chronic small vessel ischemic disease. Within limitations of motion degradation, no acute intracranial hemorrhage, acute infarct, extra-axial fluid collection or intracranial mass is identified. No midline shift. Vascular: No hyperdense vessel. Atherosclerotic calcifications. Skull: No fracture or aggressive osseous lesion. Sinuses/Orbits: No mass or acute finding within the imaged orbits. No significant paranasal sinus disease at the imaged levels. IMPRESSION: 1. Significantly motion degraded examination. 2. Within this limitation, no acute intracranial abnormality is identified. 3. Parenchymal atrophy and chronic small vessel ischemic disease. Electronically Signed   By: Jackey Loge D.O.   On: 11/18/2022 16:38   CT Angio Chest PE W and/or Wo Contrast  Result Date: 11/18/2022 CLINICAL DATA:  Suspected pulmonary embolism.  High probability. EXAM: CT ANGIOGRAPHY CHEST WITH CONTRAST TECHNIQUE: Multidetector CT imaging of the chest was performed using the standard protocol during bolus administration of intravenous contrast. Multiplanar CT image reconstructions and MIPs were obtained to evaluate the vascular anatomy. RADIATION DOSE REDUCTION: This exam was performed according to the departmental dose-optimization program which includes automated exposure control, adjustment of the mA and/or kV according to patient size and/or use of iterative reconstruction technique. CONTRAST:  50mL OMNIPAQUE IOHEXOL 350 MG/ML SOLN COMPARISON:  Portable chest yesterday, portable chest 11/16/2022 and 11/13/2022, CTA  chest 03/16/2021 FINDINGS: Cardiovascular: The pulmonary trunk is prominent measuring 3.6 cm indicating arterial hypertension, previously 3.2 cm. Both main pulmonary arteries approaching 3 cm. There is a single small branch point embolism noted in the posterior basal right lower lobe on 6:85. No other embolus is seen, but small-vessel emboli are difficult to exclude given atelectasis/consolidation in both lower lobes associated with the effusions. The mitral ring, coronary arteries and aorta are heavily calcified. There is aortic tortuosity. There are patchy calcifications along the aortic valve leaflets. There are scattered calcifications of the great vessels. There is no aortic or great vessel aneurysm, stenosis or dissection. Moderate panchamber cardiomegaly has increased in the interval as well as a circumferential pericardial effusion which now measures 1.8 cm thickness. The pulmonary veins are also prominent. There is engorgement in the left atrial appendage. Reflux seen into the IVC and hepatic veins may suggest right heart dysfunction but there is no disproportionate right heart chamber enlargement. Mediastinum/Nodes: Hypodense right thyroid nodule is not as well seen today due to extensive metallic artifact from bilateral shoulder replacements. This has been previously biopsied in 2017. Estimated current measurements 4.4 x 3.3 cm, previously 4.0 x  3.5 cm The nodule again displaces the trachea to the left does not efface the tracheal lumen. Streak artifact from the shoulder replacements continues over the mediastinum. No adenopathy is seen through the streak artifacts. There is a large hiatal hernia with inverted intrathoracic stomach. The esophagus is patulous and otherwise unremarkable. Thoracic trachea is clear. Lungs/Pleura: There is no overt pulmonary edema. There are bilateral small layering pleural effusions with adjacent atelectasis or consolidation in the lower lobes. There is chronic linear  scarring in the anterior lung apices. There is increased atelectasis in the lingular base. There is no pneumothorax.  The lungs are otherwise clear. Upper Abdomen: No acute abnormality. Musculoskeletal: Osteopenia and degenerative change thoracic spine. No acute or significant osseous findings. There is mild edema in the body wall. Review of the MIP images confirms the above findings. IMPRESSION: 1. Single small branch point arterial embolism in the posterior basal right lower lobe. No other embolus is seen, but small-vessel emboli are difficult to exclude. 2. Prominent pulmonary trunk and main pulmonary arteries, with reflux into the IVC and hepatic veins which may suggest right heart dysfunction. No disproportionate right heart chamber enlargement. 3. Cardiomegaly, interval increased pericardial effusion, and prominent pulmonary veins, without overt pulmonary edema. 4. Small layering pleural effusions with adjacent atelectasis or consolidation in the lower lobes. 5. Aortic and coronary artery atherosclerosis. 6. Large hiatal hernia with inverted intrathoracic stomach. 7. Mild body wall edema. 8. 4.4 x 3.3 cm right thyroid nodule, previously biopsied in 2017. 9. Osteopenia and degenerative change. Electronically Signed   By: Almira Bar M.D.   On: 11/18/2022 05:15   DG Chest Port 1 View  Result Date: 11/17/2022 CLINICAL DATA:  Chest pain. EXAM: PORTABLE CHEST 1 VIEW COMPARISON:  November 16, 2022 FINDINGS: There is stable mild to moderate severity cardiac silhouette enlargement. Marked severity calcification and tortuosity of the thoracic aorta is seen. Low lung volumes are noted with a stable appearing area of left basilar opacification. There is mild prominence of the perihilar pulmonary vasculature. No pneumothorax is identified. Bilateral shoulder replacements are noted. IMPRESSION: 1. Stable mild to moderate severity cardiac silhouette enlargement. 2. Low lung volumes with stable findings consistent with a  known large hiatal hernia and adjacent left basilar atelectasis. Electronically Signed   By: Aram Candela M.D.   On: 11/17/2022 23:29   DG CHEST PORT 1 VIEW  Result Date: 11/16/2022 CLINICAL DATA:  Hypoxia. EXAM: PORTABLE CHEST 1 VIEW COMPARISON:  Radiograph 11/13/2022, chest CT 03/16/2021 FINDINGS: Retrocardiac opacity is likely related to large hiatal hernia. There may be slight increased density in the lateral left lung base compared to prior exam. Stable heart size and mediastinal contours with prominent aortic atherosclerosis. Increasing right upper lobe airspace disease abutting the fissure in the right mid lung. Similar peribronchial thickening which may be pulmonary edema. Suspected small right pleural effusion. Bilateral humeral head arthroplasties, abutting the undersurface of the clavicle on the right, chronic. IMPRESSION: 1. Increasing right upper lobe airspace disease suspicious for pneumonia. 2. Peribronchial thickening may be pulmonary edema, similar. 3. Probable small right pleural effusion. 4. Left retrocardiac opacity is likely due to large hiatal hernia. There is slight increasing density in the left lateral lung base which may be due to effusion or adjacent atelectasis/airspace disease. Electronically Signed   By: Narda Rutherford M.D.   On: 11/16/2022 17:36   DG ESOPHAGUS W SINGLE CM (SOL OR THIN BA)  Result Date: 11/13/2022 CLINICAL DATA:  Provided history: Vomiting. Swallowing air. Additional history  provided: History of esophageal dysphagia/dysfunction. EXAM: ESOPHAGUS/BARIUM SWALLOW/TABLET STUDY TECHNIQUE: A single contrast examination was performed using thin liquid barium. The exam was performed by Alex Gardener, NP, and was supervised and interpreted by Dr. Jackey Loge. FLUOROSCOPY: Fluoroscopy time: 1 minute, 12 seconds (17.8 mGy). COMPARISON:  Modified barium swallow 07/20/2022. Upper GI series 06/20/2017. Esophagram 03/27/2008. FINDINGS: Limited study due to the patient's  inability to stand and limited ability to reposition on the fluoroscopy table. Additionally, a 13 mm barium tablet was not administered (nursing staff reports the patient is unable to swallow pills unless crushed). Mildly patulous esophagus. Focal narrowing of the distal esophagus compatible with stricture, similar to the prior upper GI series of 06/20/2017. It is estimated at the esophagus is narrowed to 5-7 mm at this site. Prominent intermittent esophageal dysmotility with tertiary contractions. Redemonstrated large hiatal hernia. No gastroesophageal reflux was observed. IMPRESSION: 1. Limited examination due to the patient's inability to stand and limited ability to reposition on the fluoroscopy table. Additionally, a 13 mm barium tablet was not administered (nursing staff reports the patient is unable to swallow pills unless crushed). 2. Focal narrowing of the distal esophagus compatible with stricture, similar to the prior upper GI series 06/20/2017. It is estimated that the esophagus is narrowed to 5-7 mm at this site. 3. Mildly patulous esophagus elsewhere. 4. Prominent esophageal dysmotility with tertiary contractions. 5. Redemonstrated large hiatal hernia. Electronically Signed   By: Jackey Loge D.O.   On: 11/13/2022 12:15   DG CHEST PORT 1 VIEW  Result Date: 11/13/2022 CLINICAL DATA:  Acute respiratory failure with hypoxia. EXAM: PORTABLE CHEST 1 VIEW COMPARISON:  11/11/2022. FINDINGS: Low lung volumes accentuate the pulmonary vasculature and cardiomediastinal silhouette. Unchanged mild interstitial pulmonary edema and small bilateral pleural effusions. Unchanged retrocardiac opacity, likely atelectasis. Stable cardiac and mediastinal contours. No pneumothorax. IMPRESSION: Unchanged mild interstitial pulmonary edema and small bilateral pleural effusions. Electronically Signed   By: Orvan Falconer M.D.   On: 11/13/2022 10:55   DG Chest 1 View  Result Date: 11/11/2022 CLINICAL DATA:  Shortness of  breath EXAM: PORTABLE CHEST 1 VIEW COMPARISON:  11/03/22 FINDINGS: Cardiac shadow is enlarged but stable. Aortic calcifications are again seen. Lungs are well aerated bilaterally. Slight increase in central vascular congestion is noted with edema. Large hiatal hernia is noted. IMPRESSION: Slight increase in central vascular congestion with mild edema. Large hiatal hernia. Electronically Signed   By: Alcide Clever M.D.   On: 11/11/2022 01:16   ECHOCARDIOGRAM COMPLETE  Result Date: 11/09/2022    ECHOCARDIOGRAM REPORT   Patient Name:   KIALEE KHAM Date of Exam: 11/09/2022 Medical Rec #:  [id number]       Height:       64.0 in Accession #:    [id number]      Weight:       190.7 lb Date of Birth:  1921-06-29       BSA:          1.917 m Patient Age:    89 years        BP:           135/74 mmHg Patient Gender: F               HR:           90 bpm. Exam Location:  Inpatient Procedure: 2D Echo, 3D Echo, Cardiac Doppler, Color Doppler and Intracardiac            Opacification Agent Indications:  CHF/Aortic Stenosis  History:         Patient has prior history of Echocardiogram examinations, most                  recent 04/09/2015. CHF, Signs/Symptoms:Murmur and Dyspnea; Risk                  Factors:Diabetes and Hypertension. CKD.  Sonographer:     Milda Smart Referring Phys:  [id number] Diagnosing Phys: Dina Rich MD  Sonographer Comments: Image acquisition challenging due to patient body habitus and Image acquisition challenging due to respiratory motion. IMPRESSIONS  1. Left ventricular ejection fraction, by estimation, is 65 to 70%. The left ventricle has normal function. The left ventricle has no regional wall motion abnormalities. There is mild left ventricular hypertrophy. Left ventricular diastolic parameters are consistent with Grade I diastolic dysfunction (impaired relaxation). Elevated left atrial pressure.  2. Right ventricular systolic function is normal. The right ventricular size is  normal. There is mildly elevated pulmonary artery systolic pressure.  3. Left atrial size was severely dilated.  4. A small pericardial effusion is present. The pericardial effusion is circumferential. There is no evidence of cardiac tamponade.  5. The mitral valve is abnormal. Trivial mitral valve regurgitation. Moderate mitral stenosis. The mean mitral valve gradient is 6.0 mmHg.HR 87 bpm  6. The tricuspid valve is abnormal.  7. The aortic valve is tricuspid. There is moderate calcification of the aortic valve. There is moderate thickening of the aortic valve. Aortic valve regurgitation is not visualized. Severe aortic valve stenosis. Aortic valve mean gradient measures 64.0  mmHg. Aortic valve peak gradient measures 91.6 mmHg. Aortic valve area, by VTI measures 0.94 cm.  8. The pulmonic valve was abnormal.  9. The inferior vena cava is dilated in size with >50% respiratory variability, suggesting right atrial pressure of 8 mmHg. FINDINGS  Left Ventricle: Left ventricular ejection fraction, by estimation, is 65 to 70%. The left ventricle has normal function. The left ventricle has no regional wall motion abnormalities. Definity contrast agent was given IV to delineate the left ventricular  endocardial borders. The left ventricular internal cavity size was normal in size. There is mild left ventricular hypertrophy. Left ventricular diastolic parameters are consistent with Grade I diastolic dysfunction (impaired relaxation). Elevated left atrial pressure. Right Ventricle: The right ventricular size is normal. Right vetricular wall thickness was not well visualized. Right ventricular systolic function is normal. There is mildly elevated pulmonary artery systolic pressure. The tricuspid regurgitant velocity  is 3.00 m/s, and with an assumed right atrial pressure of 8 mmHg, the estimated right ventricular systolic pressure is 44.0 mmHg. Left Atrium: Left atrial size was severely dilated. Right Atrium: Right atrial  size was normal in size. Pericardium: A small pericardial effusion is present. The pericardial effusion is circumferential. There is no evidence of cardiac tamponade. Mitral Valve: The mitral valve is abnormal. Trivial mitral valve regurgitation. Moderate mitral valve stenosis. MV peak gradient, 16.9 mmHg. The mean mitral valve gradient is 6.0 mmHg. Tricuspid Valve: The tricuspid valve is abnormal. Tricuspid valve regurgitation is mild . No evidence of tricuspid stenosis. Aortic Valve: The aortic valve is tricuspid. There is moderate calcification of the aortic valve. There is moderate thickening of the aortic valve. There is moderate aortic valve annular calcification. Aortic valve regurgitation is not visualized. Severe  aortic stenosis is present. Aortic valve mean gradient measures 64.0 mmHg. Aortic valve peak gradient measures 91.6 mmHg. Aortic valve area, by VTI measures 0.94  cm. Pulmonic Valve: The pulmonic valve was abnormal. Pulmonic valve regurgitation is mild. No evidence of pulmonic stenosis. Aorta: The aortic root and ascending aorta are structurally normal, with no evidence of dilitation. Venous: The inferior vena cava is dilated in size with greater than 50% respiratory variability, suggesting right atrial pressure of 8 mmHg. IAS/Shunts: No atrial level shunt detected by color flow Doppler.  LEFT VENTRICLE PLAX 2D LVIDd:         3.60 cm     Diastology LVIDs:         2.60 cm     LV e' medial:    4.57 cm/s LV PW:         1.10 cm     LV E/e' medial:  27.1 LV IVS:        1.10 cm     LV e' lateral:   4.46 cm/s LVOT diam:     2.00 cm     LV E/e' lateral: 27.8 LV SV:         92 LV SV Index:   48 LVOT Area:     3.14 cm  LV Volumes (MOD) LV vol d, MOD A2C: 72.9 ml LV vol d, MOD A4C: 82.1 ml LV vol s, MOD A2C: 18.7 ml LV vol s, MOD A4C: 17.5 ml LV SV MOD A2C:     54.2 ml LV SV MOD A4C:     82.1 ml LV SV MOD BP:      59.9 ml RIGHT VENTRICLE             IVC RV S prime:     13.90 cm/s  IVC diam: 2.30 cm TAPSE  (M-mode): 1.5 cm LEFT ATRIUM              Index        RIGHT ATRIUM           Index LA diam:        4.70 cm  2.45 cm/m   RA Area:     20.90 cm LA Vol (A2C):   135.0 ml 70.42 ml/m  RA Volume:   64.70 ml  33.75 ml/m LA Vol (A4C):   103.0 ml 53.72 ml/m LA Biplane Vol: 121.0 ml 63.11 ml/m  AORTIC VALVE                     PULMONIC VALVE AV Area (Vmax):    0.91 cm      PR End Diast Vel: 5.76 msec AV Area (Vmean):   0.92 cm AV Area (VTI):     0.94 cm AV Vmax:           478.50 cm/s AV Vmean:          363.500 cm/s AV VTI:            0.987 m AV Peak Grad:      91.6 mmHg AV Mean Grad:      64.0 mmHg LVOT Vmax:         139.00 cm/s LVOT Vmean:        107.000 cm/s LVOT VTI:          0.294 m LVOT/AV VTI ratio: 0.30  AORTA Ao Root diam: 2.90 cm Ao Asc diam:  3.40 cm MITRAL VALVE                TRICUSPID VALVE MV Area VTI:  1.87 cm      TR Peak grad:   36.0 mmHg MV Peak grad:  16.9 mmHg     TR Mean grad:   27.0 mmHg MV Mean grad: 6.0 mmHg      TR Vmax:        300.00 cm/s MV Vmax:      2.05 m/s      TR Vmean:       255.0 cm/s MV Vmean:     114.5 cm/s MR Peak grad: 65.9 mmHg     SHUNTS MR Vmax:      406.00 cm/s   Systemic VTI:  0.29 m MV E velocity: 124.00 cm/s  Systemic Diam: 2.00 cm Dina Rich MD Electronically signed by Dina Rich MD Signature Date/Time: 11/09/2022/3:03:42 PM    Final (Updated)    DG Abd 1 View  Result Date: 11/05/2022 CLINICAL DATA:  Constipation for several days EXAM: ABDOMEN - 1 VIEW COMPARISON:  09/16/2018 FINDINGS: Scattered large and small bowel gas is noted. Retained fecal material is noted throughout the colon consistent with a moderate degree of constipation. No obstructive changes are seen. No free air is noted. Changes of prior vertebral augmentation are seen and stable. Degenerative changes of lumbar spine are noted. IMPRESSION: Changes consistent with moderate colonic constipation. Electronically Signed   By: Alcide Clever M.D.   On: 11/05/2022 20:27   IR US Guide Bx  Asp/Drain  Result Date: 11/04/2022 INDICATION: 89 year old female with right shoulder periprosthetic fluid collections that are suggestive for abscesses. EXAM: 1. Ultrasound-guided placement of drainage catheter in lateral right shoulder abscess collection 2. Ultrasound-guided placement of drainage catheter in the anterior right shoulder abscess collection MEDICATIONS: 1% lidocaine for local anesthetic ANESTHESIA/SEDATION: None COMPLICATIONS: None immediate. PROCEDURE: Informed written consent was obtained from the patient's daughter after a thorough discussion of the procedural risks, benefits and alternatives. All questions were addressed. Maximal Sterile Barrier Technique was utilized including caps, mask, sterile gowns, sterile gloves, sterile drape, hand hygiene and skin antiseptic. A timeout was performed prior to the initiation of the procedure. The right shoulder was prepped and draped in sterile fashion. Ultrasound demonstrated a large fluid collection along the lateral aspect of the right shoulder. The skin was anesthetized with 1% lidocaine. Small incision was made. Using ultrasound guidance, a Yueh catheter was directed into the fluid collection and pink purulent fluid was aspirated. Superstiff Amplatz wire was placed and the tract was dilated to accommodate a 10 [place] multipurpose drain. Approximately 130 mL of pink purulent fluid was removed. This collection was decompressed. Ultrasound of right shoulder demonstrated persistent fluid collections in the anterior right shoulder region. The largest right anterior shoulder fluid collection was targeted. The skin was anesthetized with 1% lidocaine. Small incision was made. Using ultrasound guidance, a Yueh catheter was directed into this anterior fluid collection and yellow purulent fluid was aspirated. Superstiff Amplatz wire was placed and an 8 [employer] drain was advanced over the wire. Approximately 40 mL of fluid was removed from this  collection. Ultrasound was also used to evaluate the remainder of the right shoulder. Both drains were sutured to skin with Prolene suture. Both drains were attached to suction bulbs. Fluid sample from each drain was sent for culture. FINDINGS: There was a very large fluid collection in the lateral aspect of the right shoulder and 130 mL of pink purulent fluid was removed from this collection. This collection was decompressed at the end of the procedure. There were small fluid collections along the anterior aspect of the right shoulder including a dominant collection. An 8 French drain was placed in the  dominant anterior right fluid collection and this appeared to decompress the other right anterior fluid collections. 40 mL of yellow purulent fluid was removed from the right anterior shoulder fluid collection. IMPRESSION: 1. Ultrasound-guided placement of a drainage catheter in the right lateral shoulder abscess collection. 2. Ultrasound-guided placement of a drainage catheter in the right anterior shoulder abscess. Electronically Signed   By: Richarda Overlie M.D.   On: 11/04/2022 15:25   IR US Guide Bx Asp/Drain  Result Date: 11/04/2022 INDICATION: 89 year old female with right shoulder periprosthetic fluid collections that are suggestive for abscesses. EXAM: 1. Ultrasound-guided placement of drainage catheter in lateral right shoulder abscess collection 2. Ultrasound-guided placement of drainage catheter in the anterior right shoulder abscess collection MEDICATIONS: 1% lidocaine for local anesthetic ANESTHESIA/SEDATION: None COMPLICATIONS: None immediate. PROCEDURE: Informed written consent was obtained from the patient's daughter after a thorough discussion of the procedural risks, benefits and alternatives. All questions were addressed. Maximal Sterile Barrier Technique was utilized including caps, mask, sterile gowns, sterile gloves, sterile drape, hand hygiene and skin antiseptic. A timeout was performed prior  to the initiation of the procedure. The right shoulder was prepped and draped in sterile fashion. Ultrasound demonstrated a large fluid collection along the lateral aspect of the right shoulder. The skin was anesthetized with 1% lidocaine. Small incision was made. Using ultrasound guidance, a Yueh catheter was directed into the fluid collection and pink purulent fluid was aspirated. Superstiff Amplatz wire was placed and the tract was dilated to accommodate a 10 [place] multipurpose drain. Approximately 130 mL of pink purulent fluid was removed. This collection was decompressed. Ultrasound of right shoulder demonstrated persistent fluid collections in the anterior right shoulder region. The largest right anterior shoulder fluid collection was targeted. The skin was anesthetized with 1% lidocaine. Small incision was made. Using ultrasound guidance, a Yueh catheter was directed into this anterior fluid collection and yellow purulent fluid was aspirated. Superstiff Amplatz wire was placed and an 8 [place] drain was advanced over the wire. Approximately 40 mL of fluid was removed from this collection. Ultrasound was also used to evaluate the remainder of the right shoulder. Both drains were sutured to skin with Prolene suture. Both drains were attached to suction bulbs. Fluid sample from each drain was sent for culture. FINDINGS: There was a very large fluid collection in the lateral aspect of the right shoulder and 130 mL of pink purulent fluid was removed from this collection. This collection was decompressed at the end of the procedure. There were small fluid collections along the anterior aspect of the right shoulder including a dominant collection. An 8 French drain was placed in the dominant anterior right fluid collection and this appeared to decompress the other right anterior fluid collections. 40 mL of yellow purulent fluid was removed from the right anterior shoulder fluid collection. IMPRESSION:  1. Ultrasound-guided placement of a drainage catheter in the right lateral shoulder abscess collection. 2. Ultrasound-guided placement of a drainage catheter in the right anterior shoulder abscess. Electronically Signed   By: Richarda Overlie M.D.   On: 11/04/2022 15:25   VAS [place] LOWER EXTREMITY VENOUS (DVT) (7a-7p)  Result Date: 11/04/2022  Lower Venous DVT Study Patient Name:  NAAMA SAPPINGTON  Date of Exam:   11/03/2022 Medical Rec #: [id number]        Accession #:    [id number] Date of Birth: 11-30-1920        Patient Gender: F Patient Age:   37 years Exam Location:  [facility] Procedure:      VAS [place] LOWER EXTREMITY VENOUS (DVT) Referring Phys: ADAM CURATOLO --------------------------------------------------------------------------------  Indications: Pain, Edema, and Chronic swelling.  Risk Factors: Surgery Lower ext bypass graft, left leg stenting. Comparison Study: 03/16/21 - Negative LE venous Performing Technologist: [place] RDMS, RVT  Examination Guidelines: A complete evaluation includes B-mode imaging, spectral Doppler, color Doppler, and power Doppler as needed of all accessible portions of each vessel. Bilateral testing is considered an integral part of a complete examination. Limited examinations for reoccurring indications may be performed as noted. The reflux portion of the exam is performed with the patient in reverse Trendelenburg.  +---------+---------------+---------+-----------+----------+-------------------+ RIGHT    CompressibilityPhasicitySpontaneityPropertiesThrombus Aging      +---------+---------------+---------+-----------+----------+-------------------+ CFV      Full           Yes      Yes                                      +---------+---------------+---------+-----------+----------+-------------------+ SFJ      Full                                                             +---------+---------------+---------+-----------+----------+-------------------+  FV Prox  Full                                                             +---------+---------------+---------+-----------+----------+-------------------+ FV Mid   Full                                                             +---------+---------------+---------+-----------+----------+-------------------+ FV DistalFull                                                             +---------+---------------+---------+-----------+----------+-------------------+ PFV      Full                                                             +---------+---------------+---------+-----------+----------+-------------------+ POP      Full           Yes      Yes                                      +---------+---------------+---------+-----------+----------+-------------------+ PTV      Full                                                             +---------+---------------+---------+-----------+----------+-------------------+  PERO                                                  Not well visualized +---------+---------------+---------+-----------+----------+-------------------+   Right Technical Findings: Bilateral calf veins were not well visualized. Cannot exclude chronic DVT in the calf veins.  +---------+---------------+---------+-----------+----------+-------------------+ LEFT     CompressibilityPhasicitySpontaneityPropertiesThrombus Aging      +---------+---------------+---------+-----------+----------+-------------------+ CFV      Full           Yes      Yes                                      +---------+---------------+---------+-----------+----------+-------------------+ SFJ      Full                                                             +---------+---------------+---------+-----------+----------+-------------------+ FV Prox  Full                                                              +---------+---------------+---------+-----------+----------+-------------------+ FV Mid   Full                                                             +---------+---------------+---------+-----------+----------+-------------------+ FV DistalFull                                                             +---------+---------------+---------+-----------+----------+-------------------+ PFV      Full                                                             +---------+---------------+---------+-----------+----------+-------------------+ POP      Full           Yes      Yes                                      +---------+---------------+---------+-----------+----------+-------------------+ PTV      Full                                                             +---------+---------------+---------+-----------+----------+-------------------+  PERO                                                  Not well visualized +---------+---------------+---------+-----------+----------+-------------------+     Summary: BILATERAL: - No evidence of acute deep vein thrombosis seen in the lower extremities, bilaterally. -No evidence of popliteal cyst, bilaterally. RIGHT: -   *See table(s) above for measurements and observations. Electronically signed by Gerarda Fraction on 11/04/2022 at 10:55:07 AM.    Final    CT SHOULDER RIGHT W CONTRAST  Result Date: 11/03/2022 CLINICAL DATA:  Septic arthritis suspected, shoulder, xray done. Leukocytosis and elevated serum inflammatory markers. EXAM: CT OF THE UPPER RIGHT EXTREMITY WITH CONTRAST TECHNIQUE: Multidetector CT imaging of the upper right extremity was performed according to the standard protocol following intravenous contrast administration. RADIATION DOSE REDUCTION: This exam was performed according to the departmental dose-optimization program which includes automated exposure control, adjustment of the mA and/or kV according to patient  size and/or use of iterative reconstruction technique. CONTRAST:  75mL OMNIPAQUE IOHEXOL 350 MG/ML SOLN COMPARISON:  X-ray 11/03/2022, CT 05/27/2021 FINDINGS: Bones/Joint/Cartilage Long stem right humeral prosthesis. The inferior tip of the prosthesis is not included within the field of view. Chronic superomedial migration of the humeral head component with extensive osseous remodeling of the scapula and distal clavicle. Chronic osteolysis involving the proximal humeral diaphysis, not significantly progressed. No acute fracture. Os acromiale. Ligaments Suboptimally assessed by CT. Muscles and Tendons Findings of chronic rotator cuff tears with chronic muscle atrophy. Soft tissues Large peripherally enhancing periprosthetic fluid collection surrounding the proximal right humerus measuring approximately 14 x 5 cm in transaxial dimension. Loculated component at the anterior shoulder extends approximately 10 cm in craniocaudal dimension. Collection closely approximates the skin surface at the anterior aspect of the shoulder. Loculated component at the posterior aspect of the shoulder measures approximately 8 cm in craniocaudal dimension. Mildly prominent right axillary lymph nodes are likely reactive. Small layering right-sided pleural effusion. Multinodular goiter, similar to the previous study. In the setting of significant comorbidities or limited life expectancy, no follow-up recommended (ref: J Am Coll Radiol. 2015 Feb;12(2): 143-50). IMPRESSION: 1. Large peripherally-enhancing periprosthetic fluid collection surrounding the proximal right humerus measuring up to 14 cm in maximal dimension. Findings are most concerning for infected fluid collection/abscess given the patient's history. Aspiration and joint fluid analysis is recommended. 2. Chronic superomedial migration of the humeral head component with extensive osseous remodeling of the scapula and distal clavicle. Chronic osteolysis involving the proximal  humeral diaphysis, not significantly progressed. 3. Small layering right-sided pleural effusion. Electronically Signed   By: Duanne Guess D.O.   On: 11/03/2022 15:09   DG Chest 2 View  Result Date: 11/03/2022 CLINICAL DATA:  Chest pain. EXAM: CHEST - 2 VIEW COMPARISON:  October 25, 2022. FINDINGS: Stable cardiomegaly. Large hiatal hernia is noted. Minimal right basilar subsegmental atelectasis is noted. Status post bilateral shoulder arthroplasties. IMPRESSION: Minimal right basilar subsegmental atelectasis. Large hiatal hernia. Electronically Signed   By: Lupita Raider M.D.   On: 11/03/2022 12:06   DG Shoulder Right  Result Date: 11/03/2022 CLINICAL DATA:  Pain EXAM: RIGHT SHOULDER - 3 VIEW COMPARISON:  None Available. FINDINGS: There is a right shoulder prosthesis. There appears to be grossly anatomic alignment. There is absence of the acromial humeral distance indicating the presence of rotator cuff defect. No acute  fracture, dislocation or subluxation identified. IMPRESSION: Status post right shoulder arthroplasty. No acute osseous abnormalities are seen. Electronically Signed   By: Layla Maw M.D.   On: 11/03/2022 12:03   DG Chest 2 View  Result Date: 10/25/2022 CLINICAL DATA:  Shortness of breath. EXAM: CHEST - 2 VIEW COMPARISON:  09/28/2022. FINDINGS: The heart is enlarged and the mediastinal contour is within normal limits. A large hiatal hernia is noted. There is atherosclerotic calcification of the aorta. Lung volumes are low with atelectasis at the lung bases. No effusion or pneumothorax. Shoulder arthroplasty changes are present bilaterally. Degenerative changes are noted on the thoracic spine. Surgical clips are present in the right upper quadrant. IMPRESSION: 1. Low lung volumes with mild atelectasis at the lung bases. 2. Cardiomegaly. 3. Large hiatal hernia. Electronically Signed   By: Thornell Sartorius M.D.   On: 10/25/2022 22:54    Microbiology: Recent Results (from the past 240  hour(s))  Resp panel by RT-PCR (RSV, Flu A&B, Covid) Urine, Catheterized     Status: None   Collection Time: 11/17/22 11:56 PM   Specimen: Urine, Catheterized; Nasal Swab  Result Value Ref Range Status   SARS Coronavirus 2 by RT PCR NEGATIVE NEGATIVE Final   Influenza A by PCR NEGATIVE NEGATIVE Final   Influenza B by PCR NEGATIVE NEGATIVE Final    Comment: (NOTE) The Xpert Xpress SARS-CoV-2/FLU/RSV plus assay is intended as an aid in the diagnosis of influenza from Nasopharyngeal swab specimens and should not be used as a sole basis for treatment. Nasal washings and aspirates are unacceptable for Xpert Xpress SARS-CoV-2/FLU/RSV testing.  Fact Sheet for Patients: BloggerCourse.com  Fact Sheet for Healthcare Providers: SeriousBroker.it  This test is not yet approved or cleared by the [place] FDA and has been authorized for detection and/or diagnosis of SARS-CoV-2 by FDA under an Emergency Use Authorization (EUA). This EUA will remain in effect (meaning this test can be used) for the duration of the COVID-19 declaration under Section 564(b)(1) of the Act, 21 U.S.C. section 360bbb-3(b)(1), unless the authorization is terminated or revoked.     Resp Syncytial Virus by PCR NEGATIVE NEGATIVE Final    Comment: (NOTE) Fact Sheet for Patients: BloggerCourse.com  Fact Sheet for Healthcare Providers: SeriousBroker.it  This test is not yet approved or cleared by the [place] FDA and has been authorized for detection and/or diagnosis of SARS-CoV-2 by FDA under an Emergency Use Authorization (EUA). This EUA will remain in effect (meaning this test can be used) for the duration of the COVID-19 declaration under Section 564(b)(1) of the Act, 21 U.S.C. section 360bbb-3(b)(1), unless the authorization is terminated or revoked.  Performed at [facility], [address],  [place], [place] [address]   Respiratory (~20 pathogens) panel by PCR     Status: None   Collection Time: 11/18/22 10:40 AM   Specimen: Nasopharyngeal Swab; Respiratory  Result Value Ref Range Status   Adenovirus NOT DETECTED NOT DETECTED Final   Coronavirus 229E NOT DETECTED NOT DETECTED Final    Comment: (NOTE) The Coronavirus on the Respiratory Panel, DOES NOT test for the novel  Coronavirus (2019 nCoV)    Coronavirus HKU1 NOT DETECTED NOT DETECTED Final   Coronavirus NL63 NOT DETECTED NOT DETECTED Final   Coronavirus OC43 NOT DETECTED NOT DETECTED Final   Metapneumovirus NOT DETECTED NOT DETECTED Final   Rhinovirus / Enterovirus NOT DETECTED NOT DETECTED Final   Influenza A NOT DETECTED NOT DETECTED Final   Influenza B NOT DETECTED  NOT DETECTED Final   Parainfluenza Virus 1 NOT DETECTED NOT DETECTED Final   Parainfluenza Virus 2 NOT DETECTED NOT DETECTED Final   Parainfluenza Virus 3 NOT DETECTED NOT DETECTED Final   Parainfluenza Virus 4 NOT DETECTED NOT DETECTED Final   Respiratory Syncytial Virus NOT DETECTED NOT DETECTED Final   Bordetella pertussis NOT DETECTED NOT DETECTED Final   Bordetella Parapertussis NOT DETECTED NOT DETECTED Final   Chlamydophila pneumoniae NOT DETECTED NOT DETECTED Final   Mycoplasma pneumoniae NOT DETECTED NOT DETECTED Final    Comment: Performed at [facility], [address], [place], [place] [address]     Labs: Basic Metabolic Panel: Recent Labs  Lab 11/17/22 2027 11/18/22 1429 11/19/22 0225 11/20/22 0157 11/21/22 0629 11/22/22 0138  NA 134*  --  130* 128* 126* 127*  K 4.7  --  4.2 4.3 4.3 4.5  CL 87*  --  81* 83* 79* 77*  CO2 37*  --  40* 38* 38* 39*  GLUCOSE 165*  --  245* 201* 165* 213*  BUN 15  --  9 9 13 16   CREATININE 0.59  --  0.52 0.54 0.54 0.57  CALCIUM 9.5  --  9.3 9.0 8.9 9.0  MG  --  2.0  --   --   --   --    Liver Function Tests: Recent Labs  Lab 11/16/22 0357 11/17/22 2027  AST 19 17  ALT 21 16  ALKPHOS 62  63  BILITOT 0.5 0.5  PROT 6.1* 6.5  ALBUMIN 2.1* 2.2*   Recent Labs  Lab 11/17/22 2027  LIPASE 20   No results for input(s): "AMMONIA" in the last 168 hours. CBC: Recent Labs  Lab 11/17/22 2027 11/19/22 0225 11/20/22 0157 11/21/22 0629 11/22/22 0138  WBC 10.1 8.2 10.2 9.8 9.9  HGB 9.0* 8.7* 8.7* 8.9* 8.8*  HCT 30.0* 28.8* 28.7* 29.3* 28.0*  MCV 89.8 88.1 88.3 88.0 88.1  PLT 382 375 357 341 311   Cardiac Enzymes: No results for input(s): "CKTOTAL", "CKMB", "CKMBINDEX", "TROPONINI" in the last 168 hours. BNP: BNP (last 3 results) Recent Labs    10/25/22 2234 11/03/22 1133 11/18/22 0053  BNP 111.6* 224.2* 281.6*    ProBNP (last 3 results) Recent Labs    09/27/22 1218  PROBNP 77.0    CBG: Recent Labs  Lab 11/21/22 1115 11/21/22 1548 11/21/22 2107 11/22/22 0729 11/22/22 1127  GLUCAP 182* 348* 255* 179* 227*       Signed:  Rhetta Mura MD   [facility] 11/22/2022, 11:38 AM

## 2022-11-22 NOTE — Plan of Care (Signed)
  Problem: Education: Goal: Ability to describe self-care measures that may prevent or decrease complications (Diabetes Survival Skills Education) will improve Outcome: Progressing Goal: Individualized Educational Video(s) Outcome: Progressing   Problem: Coping: Goal: Ability to adjust to condition or change in health will improve Outcome: Progressing   Problem: Fluid Volume: Goal: Ability to maintain a balanced intake and output will improve Outcome: Progressing   Problem: Health Behavior/Discharge Planning: Goal: Ability to identify and utilize available resources and services will improve Outcome: Progressing Goal: Ability to manage health-related needs will improve Outcome: Progressing   Problem: Metabolic: Goal: Ability to maintain appropriate glucose levels will improve Outcome: Progressing   Problem: Nutritional: Goal: Maintenance of adequate nutrition will improve Outcome: Progressing Goal: Progress toward achieving an optimal weight will improve Outcome: Progressing   Problem: Skin Integrity: Goal: Risk for impaired skin integrity will decrease Outcome: Progressing   Problem: Tissue Perfusion: Goal: Adequacy of tissue perfusion will improve Outcome: Progressing   Problem: Education: Goal: Ability to demonstrate management of disease process will improve Outcome: Progressing Goal: Ability to verbalize understanding of medication therapies will improve Outcome: Progressing Goal: Individualized Educational Video(s) Outcome: Progressing   Problem: Activity: Goal: Capacity to carry out activities will improve Outcome: Progressing   Problem: Cardiac: Goal: Ability to achieve and maintain adequate cardiopulmonary perfusion will improve Outcome: Progressing   Problem: Education: Goal: Knowledge of disease or condition will improve Outcome: Progressing Goal: Knowledge of the prescribed therapeutic regimen will improve Outcome: Progressing Goal:  Individualized Educational Video(s) Outcome: Progressing   Problem: Activity: Goal: Ability to tolerate increased activity will improve Outcome: Progressing Goal: Will verbalize the importance of balancing activity with adequate rest periods Outcome: Progressing   Problem: Respiratory: Goal: Ability to maintain a clear airway will improve Outcome: Progressing Goal: Levels of oxygenation will improve Outcome: Progressing Goal: Ability to maintain adequate ventilation will improve Outcome: Progressing   Problem: Education: Goal: Knowledge of General Education information will improve Description: Including pain rating scale, medication(s)/side effects and non-pharmacologic comfort measures Outcome: Progressing   Problem: Health Behavior/Discharge Planning: Goal: Ability to manage health-related needs will improve Outcome: Progressing   Problem: Clinical Measurements: Goal: Ability to maintain clinical measurements within normal limits will improve Outcome: Progressing Goal: Will remain free from infection Outcome: Progressing Goal: Diagnostic test results will improve Outcome: Progressing Goal: Respiratory complications will improve Outcome: Progressing Goal: Cardiovascular complication will be avoided Outcome: Progressing   Problem: Activity: Goal: Risk for activity intolerance will decrease Outcome: Progressing   Problem: Nutrition: Goal: Adequate nutrition will be maintained Outcome: Progressing   Problem: Coping: Goal: Level of anxiety will decrease Outcome: Progressing   Problem: Elimination: Goal: Will not experience complications related to bowel motility Outcome: Progressing Goal: Will not experience complications related to urinary retention Outcome: Progressing   Problem: Pain Managment: Goal: General experience of comfort will improve Outcome: Progressing   Problem: Safety: Goal: Ability to remain free from injury will improve Outcome:  Progressing   Problem: Skin Integrity: Goal: Risk for impaired skin integrity will decrease Outcome: Progressing   

## 2022-11-22 NOTE — TOC Transition Note (Signed)
Transition of Care [facility]) - CM/SW Discharge Note   Patient Details  Name: Heidi Dominguez MRN: [id number] Date of Birth: Nov 23, 1920  Transition of Care [facility]) CM/SW Contact:  Ralene Bathe, LCSWA Phone Number: 11/22/2022, 12:52 PM   Clinical Narrative:    Patient will DC to: [facility] Anticipated DC date:  11/22/2022 Family notified: Yes, daughter Health and safety inspector by: Sharin Mons   Per MD patient ready for DC to LT SNF. RN to call report prior to discharge [phone] room 308P). RN, patient's family, and facility notified of DC. Discharge Summary  sent to facility. DC packet on chart. Insurance authorization for Physical Therapy at facility is pending, but not needed for patient to return as patient is a LT resident.  Daughter informed.  Ambulance transport will be requested for patient.   CSW will sign off for now as social work intervention is no longer needed. Please consult [place] again if new needs arise.    Final next level of care: Skilled Nursing Facility Barriers to Discharge: Barriers Resolved   Patient Goals and CMS Choice CMS Medicare.gov Compare Post Acute Care list provided to:: Patient Represenative (must comment) Choice offered to / list presented to : Patient, Adult Children  Discharge Placement                Patient [facility] bed at: [facility] Patient to be transferred to facility by: [facility] Name of family member notified: Kilduff,[name] (Daughter) [phone] Patient and family notified of of transfer: 11/22/22  Discharge Plan and Services Additional resources added to the After Visit Summary for                                       Social Determinants of Health (SDOH) Interventions SDOH Screenings   Food Insecurity: No Food Insecurity (11/03/2022)  Housing: Low Risk  (11/03/2022)  Transportation Needs: No Transportation Needs (11/03/2022)  Utilities: Not At Risk (11/03/2022)  Depression (PHQ2-9): Low Risk  (02/09/2021)  Tobacco Use: Low  Risk  (11/04/2022)     Readmission Risk Interventions     No data to display

## 2022-11-22 NOTE — TOC Progression Note (Signed)
Transition of Care [facility]) - Initial/Assessment Note    Patient Details  Name: Heidi Dominguez MRN: [id number] Date of Birth: 1920/09/24  [facility] of [facility]) CM/SW Contact:    Ralene Bathe, LCSWA Phone Number: 11/22/2022, 9:06 AM  Clinical Narrative:                 Insurance company is offering  peer to peer due today by noon.  Attending informed and patient's daughter updated.    TOC following.         Patient Goals and CMS Choice            Expected Discharge Plan and Services                                              Prior Living Arrangements/Services                       Activities of Daily Living      Permission Sought/Granted                  Emotional Assessment              Admission diagnosis:  Hiatal hernia [K44.9] Precordial chest pain [R07.2] Hypoalbuminemia [E88.09] Pulmonary embolism [I26.99] Chronic anemia [D64.9] History of vomiting [Z87.898] Acute pulmonary embolism [I26.99] Patient Active Problem List   Diagnosis Date Noted   Acute pulmonary embolism 11/19/2022   Pulmonary embolism 11/18/2022   Severe aortic stenosis 11/18/2022   Confusion 11/18/2022   Acute on chronic diastolic CHF (congestive heart failure) 11/15/2022   Normocytic anemia 11/15/2022   Obesity (BMI 30-39.9) 11/15/2022   Acute on chronic respiratory failure with hypoxia 09/28/2022   Bilateral lower extremity edema 09/28/2022   COPD with acute exacerbation 09/27/2022   Osteomyelitis 01/12/2021   Peripheral artery disease 12/22/2020   Diabetic foot ulcer 09/02/2020   Diabetic neuropathy 09/02/2020   Gait abnormality 09/02/2020   Diabetic retinopathy associated with type 2 diabetes mellitus 09/02/2020   Hardening of the aorta (main artery of the heart) 09/02/2020   Thrombocytopenia 09/02/2020   Gastroesophageal reflux disease 10/22/2019   Unspecified chronic bronchitis 09/02/2019   Rhinitis, chronic 01/07/2019   Right  shoulder prosthetic joint infection 11/08/2018   History of hemiarthroplasty of right shoulder 09/25/2018   Acute pancreatitis 08/28/2018   Acute recurrent pancreatitis    Abdominal pain 08/23/2018   Elevated LFTs 07/04/2018   CKD (chronic kidney disease) stage 3, GFR 30-59 ml/min 07/04/2018   Upper airway cough syndrome 03/13/2018   Sensorineural hearing loss (SNHL), bilateral 12/04/2016   Post-nasal drainage 03/14/2016   Presbycusis of both ears 03/14/2016   Bilateral hearing loss 10/06/2015   Bilateral impacted cerumen 10/06/2015   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   Subjective tinnitus of both ears 10/06/2015   Choledocholithiasis with chronic cholecystitis, nonobstructing 04/11/2015   Pre-operative cardiovascular examination, high risk surgery    Epigastric abdominal pain 04/08/2015   Acute gallstone pancreatitis s/p lap chole w IOC 04/11/2015 04/08/2015   Diabetes mellitus type 2, controlled 04/08/2015   Gallstone pancreatitis 04/08/2015   Mitral regurgitation 04/08/2015   Chronic diastolic CHF (congestive heart failure) 04/08/2015   DOE (dyspnea on exertion) 03/31/2015   Ischemic colitis (HCC) 08/09/2012   Colitis 08/07/2012   Rectal bleeding 08/07/2012   Weakness 09/24/2011   Bradycardia 09/24/2011  Acute lower UTI 09/24/2011   Hematochezia 09/24/2011   Essential hypertension 09/24/2011   PCP:  Merlene Laughter, MD Pharmacy:   [facility] [address] [place] - [address] [place] [address] Phone: [phone] Fax: [phone]  [id number] - [place], [place] - [address] [place] [address] Phone: [phone] Fax: [phone]     Social Determinants of Health (SDOH) Social History: SDOH Screenings   Food Insecurity: No Food Insecurity (11/03/2022)  Housing: Low Risk  (11/03/2022)  Transportation Needs: No Transportation Needs (11/03/2022)   Utilities: Not At Risk (11/03/2022)  Depression (PHQ2-9): Low Risk  (02/09/2021)  Tobacco Use: Low Risk  (11/04/2022)   SDOH Interventions:     Readmission Risk Interventions     No data to display

## 2022-11-22 NOTE — Plan of Care (Signed)
Problem: Education: Goal: Ability to describe self-care measures that may prevent or decrease complications (Diabetes Survival Skills Education) will improve 11/22/2022 1151 by [name], RN Outcome: Completed/Met 11/22/2022 1151 by [name], RN Outcome: Progressing Goal: Individualized Educational Video(s) 11/22/2022 1151 by [name], RN Outcome: Completed/Met 11/22/2022 1151 by [name], RN Outcome: Progressing   Problem: Coping: Goal: Ability to adjust to condition or change in health will improve 11/22/2022 1151 by [name], RN Outcome: Completed/Met 11/22/2022 1151 by [name], RN Outcome: Progressing   Problem: Fluid Volume: Goal: Ability to maintain a balanced intake and output will improve 11/22/2022 1151 by [name], RN Outcome: Completed/Met 11/22/2022 1151 by [name], RN Outcome: Progressing   Problem: Health Behavior/Discharge Planning: Goal: Ability to identify and utilize available resources and services will improve 11/22/2022 1151 by [name], RN Outcome: Completed/Met 11/22/2022 1151 by [name], RN Outcome: Progressing Goal: Ability to manage health-related needs will improve 11/22/2022 1151 by [name], RN Outcome: Completed/Met 11/22/2022 1151 by [name], RN Outcome: Progressing   Problem: Metabolic: Goal: Ability to maintain appropriate glucose levels will improve 11/22/2022 1151 by [name], RN Outcome: Completed/Met 11/22/2022 1151 by [name], RN Outcome: Progressing   Problem: Nutritional: Goal: Maintenance of adequate nutrition will improve 11/22/2022 1151 by [name], RN Outcome: Completed/Met 11/22/2022 1151 by [name], RN Outcome: Progressing Goal: Progress toward achieving an optimal weight will improve 11/22/2022 1151 by [name], RN Outcome: Completed/Met 11/22/2022 1151 by [name], RN Outcome: Progressing   Problem: Skin  Integrity: Goal: Risk for impaired skin integrity will decrease 11/22/2022 1151 by [name], RN Outcome: Completed/Met 11/22/2022 1151 by [name], RN Outcome: Progressing   Problem: Tissue Perfusion: Goal: Adequacy of tissue perfusion will improve 11/22/2022 1151 by [name], RN Outcome: Completed/Met 11/22/2022 1151 by [name], RN Outcome: Progressing   Problem: Education: Goal: Ability to demonstrate management of disease process will improve 11/22/2022 1151 by [name], RN Outcome: Completed/Met 11/22/2022 1151 by [name], RN Outcome: Progressing Goal: Ability to verbalize understanding of medication therapies will improve 11/22/2022 1151 by [name], RN Outcome: Completed/Met 11/22/2022 1151 by [name], RN Outcome: Progressing Goal: Individualized Educational Video(s) 11/22/2022 1151 by [name], RN Outcome: Completed/Met 11/22/2022 1151 by [name], RN Outcome: Progressing   Problem: Activity: Goal: Capacity to carry out activities will improve 11/22/2022 1151 by [name], RN Outcome: Completed/Met 11/22/2022 1151 by [name], RN Outcome: Progressing   Problem: Cardiac: Goal: Ability to achieve and maintain adequate cardiopulmonary perfusion will improve 11/22/2022 1151 by [name], RN Outcome: Completed/Met 11/22/2022 1151 by [name], RN Outcome: Progressing   Problem: Education: Goal: Knowledge of disease or condition will improve 11/22/2022 1151 by [name], RN Outcome: Completed/Met 11/22/2022 1151 by [name], RN Outcome: Progressing Goal: Knowledge of the prescribed therapeutic regimen will improve 11/22/2022 1151 by [name], RN Outcome: Completed/Met 11/22/2022 1151 by [name], RN Outcome: Progressing Goal: Individualized Educational Video(s) 11/22/2022 1151 by [name], RN Outcome: Completed/Met 11/22/2022 1151 by [name], RN Outcome:  Progressing   Problem: Activity: Goal: Ability to tolerate increased activity will improve 11/22/2022 1151 by [name], RN Outcome: Completed/Met 11/22/2022 1151 by [name], RN Outcome: Progressing Goal: Will verbalize the importance of balancing activity with adequate rest periods 11/22/2022 1151 by [name], RN Outcome: Completed/Met 11/22/2022 1151 by [name], RN Outcome: Progressing   Problem: Respiratory: Goal: Ability to maintain a clear airway will improve 11/22/2022 1151 by [name],  RN Outcome: Completed/Met 11/22/2022 1151 by [name], RN Outcome: Progressing Goal: Levels of oxygenation will improve 11/22/2022 1151 by [name], RN Outcome: Completed/Met 11/22/2022 1151 by [name], RN Outcome: Progressing Goal: Ability to maintain adequate ventilation will improve 11/22/2022 1151 by [name], RN Outcome: Completed/Met 11/22/2022 1151 by [name], RN Outcome: Progressing   Problem: Education: Goal: Knowledge of General Education information will improve Description: Including pain rating scale, medication(s)/side effects and non-pharmacologic comfort measures 11/22/2022 1151 by [name], RN Outcome: Completed/Met 11/22/2022 1151 by [name], RN Outcome: Progressing   Problem: Health Behavior/Discharge Planning: Goal: Ability to manage health-related needs will improve 11/22/2022 1151 by [name], RN Outcome: Completed/Met 11/22/2022 1151 by [name], RN Outcome: Progressing   Problem: Clinical Measurements: Goal: Ability to maintain clinical measurements within normal limits will improve 11/22/2022 1151 by [name], RN Outcome: Completed/Met 11/22/2022 1151 by [name], RN Outcome: Progressing Goal: Will remain free from infection 11/22/2022 1151 by [name], RN Outcome: Completed/Met 11/22/2022 1151 by [name], RN Outcome: Progressing Goal: Diagnostic test  results will improve 11/22/2022 1151 by [name], RN Outcome: Completed/Met 11/22/2022 1151 by [name], RN Outcome: Progressing Goal: Respiratory complications will improve 11/22/2022 1151 by [name], RN Outcome: Completed/Met 11/22/2022 1151 by [name], RN Outcome: Progressing Goal: Cardiovascular complication will be avoided 11/22/2022 1151 by [name], RN Outcome: Completed/Met 11/22/2022 1151 by [name], RN Outcome: Progressing   Problem: Activity: Goal: Risk for activity intolerance will decrease 11/22/2022 1151 by [name], RN Outcome: Completed/Met 11/22/2022 1151 by [name], RN Outcome: Progressing   Problem: Nutrition: Goal: Adequate nutrition will be maintained 11/22/2022 1151 by [name], RN Outcome: Completed/Met 11/22/2022 1151 by [name], RN Outcome: Progressing   Problem: Coping: Goal: Level of anxiety will decrease 11/22/2022 1151 by [name], RN Outcome: Completed/Met 11/22/2022 1151 by [name], RN Outcome: Progressing   Problem: Elimination: Goal: Will not experience complications related to bowel motility 11/22/2022 1151 by [name], RN Outcome: Completed/Met 11/22/2022 1151 by [name], RN Outcome: Progressing Goal: Will not experience complications related to urinary retention 11/22/2022 1151 by [name], RN Outcome: Completed/Met 11/22/2022 1151 by [name], RN Outcome: Progressing   Problem: Pain Managment: Goal: General experience of comfort will improve 11/22/2022 1151 by [name], RN Outcome: Completed/Met 11/22/2022 1151 by [name], RN Outcome: Progressing   Problem: Safety: Goal: Ability to remain free from injury will improve 11/22/2022 1151 by [name], RN Outcome: Completed/Met 11/22/2022 1151 by [name], RN Outcome: Progressing   Problem: Skin Integrity: Goal: Risk for impaired skin integrity will  decrease 11/22/2022 1151 by [name], RN Outcome: Completed/Met 11/22/2022 1151 by [name], RN Outcome: Progressing

## 2022-11-22 NOTE — Progress Notes (Signed)
11/22/22  I contacted this patient 's daughter via phone.  Eliquis education completed with patient's daughter, Cynthina Penado via phone.  Education included Eliquis dosing, to avoid missing any doses and explained the signs of bleeding to watch for and report to SNF stafff and MD, such as bleeding from nose, coughing up blood , vomiting blood or coffee ground emesis., blood in urine or unusual color urine (red, pink or dark brown), unusual colored bowel movements  (red or black), unusual bruising for unknown reasons, a fall or if she hits her head even if no bleeding seen should be reported to MD , RN , SNF staff.   Thank you for allowing pharmacy to be part of this patients care team. Noah Delaine, RPh Clinical Pharmacist 11/22/2022 7:29 PM

## 2022-11-22 NOTE — Telephone Encounter (Signed)
Shanda Bumps from [facility] called to cancel pt's appt due to her still being an inpt at [facility]. They stated that scheduling/transportation will be back in contact with [place] to resch this appt once she has returned to the facility.

## 2022-11-23 ENCOUNTER — Inpatient Hospital Stay: Payer: Medicare PPO | Admitting: Internal Medicine

## 2022-11-29 ENCOUNTER — Emergency Department (HOSPITAL_COMMUNITY): Payer: Medicare PPO

## 2022-11-29 ENCOUNTER — Other Ambulatory Visit: Payer: Self-pay

## 2022-11-29 ENCOUNTER — Inpatient Hospital Stay (HOSPITAL_COMMUNITY)
Admission: EM | Admit: 2022-11-29 | Discharge: 2022-12-13 | DRG: 291 | Disposition: E | Payer: Medicare PPO | Source: Skilled Nursing Facility | Attending: Internal Medicine | Admitting: Internal Medicine

## 2022-11-29 ENCOUNTER — Encounter (HOSPITAL_COMMUNITY): Payer: Self-pay | Admitting: Emergency Medicine

## 2022-11-29 DIAGNOSIS — Z882 Allergy status to sulfonamides status: Secondary | ICD-10-CM

## 2022-11-29 DIAGNOSIS — Z66 Do not resuscitate: Secondary | ICD-10-CM

## 2022-11-29 DIAGNOSIS — Z6832 Body mass index (BMI) 32.0-32.9, adult: Secondary | ICD-10-CM | POA: Diagnosis not present

## 2022-11-29 DIAGNOSIS — I350 Nonrheumatic aortic (valve) stenosis: Secondary | ICD-10-CM | POA: Diagnosis present

## 2022-11-29 DIAGNOSIS — R627 Adult failure to thrive: Secondary | ICD-10-CM | POA: Diagnosis present

## 2022-11-29 DIAGNOSIS — I739 Peripheral vascular disease, unspecified: Secondary | ICD-10-CM | POA: Diagnosis present

## 2022-11-29 DIAGNOSIS — Z794 Long term (current) use of insulin: Secondary | ICD-10-CM | POA: Diagnosis not present

## 2022-11-29 DIAGNOSIS — R410 Disorientation, unspecified: Secondary | ICD-10-CM | POA: Diagnosis not present

## 2022-11-29 DIAGNOSIS — I10 Essential (primary) hypertension: Secondary | ICD-10-CM | POA: Diagnosis not present

## 2022-11-29 DIAGNOSIS — Z515 Encounter for palliative care: Secondary | ICD-10-CM

## 2022-11-29 DIAGNOSIS — I130 Hypertensive heart and chronic kidney disease with heart failure and stage 1 through stage 4 chronic kidney disease, or unspecified chronic kidney disease: Principal | ICD-10-CM | POA: Diagnosis present

## 2022-11-29 DIAGNOSIS — J449 Chronic obstructive pulmonary disease, unspecified: Secondary | ICD-10-CM | POA: Diagnosis not present

## 2022-11-29 DIAGNOSIS — J690 Pneumonitis due to inhalation of food and vomit: Secondary | ICD-10-CM | POA: Diagnosis not present

## 2022-11-29 DIAGNOSIS — R112 Nausea with vomiting, unspecified: Secondary | ICD-10-CM | POA: Diagnosis present

## 2022-11-29 DIAGNOSIS — Z96611 Presence of right artificial shoulder joint: Secondary | ICD-10-CM | POA: Diagnosis present

## 2022-11-29 DIAGNOSIS — E1151 Type 2 diabetes mellitus with diabetic peripheral angiopathy without gangrene: Secondary | ICD-10-CM | POA: Diagnosis present

## 2022-11-29 DIAGNOSIS — R570 Cardiogenic shock: Secondary | ICD-10-CM | POA: Diagnosis not present

## 2022-11-29 DIAGNOSIS — I5033 Acute on chronic diastolic (congestive) heart failure: Secondary | ICD-10-CM | POA: Diagnosis present

## 2022-11-29 DIAGNOSIS — H9193 Unspecified hearing loss, bilateral: Secondary | ICD-10-CM | POA: Diagnosis present

## 2022-11-29 DIAGNOSIS — T8450XA Infection and inflammatory reaction due to unspecified internal joint prosthesis, initial encounter: Secondary | ICD-10-CM | POA: Diagnosis present

## 2022-11-29 DIAGNOSIS — Z96653 Presence of artificial knee joint, bilateral: Secondary | ICD-10-CM | POA: Diagnosis present

## 2022-11-29 DIAGNOSIS — I509 Heart failure, unspecified: Principal | ICD-10-CM

## 2022-11-29 DIAGNOSIS — Z79899 Other long term (current) drug therapy: Secondary | ICD-10-CM | POA: Diagnosis not present

## 2022-11-29 DIAGNOSIS — R1310 Dysphagia, unspecified: Secondary | ICD-10-CM | POA: Diagnosis present

## 2022-11-29 DIAGNOSIS — H04129 Dry eye syndrome of unspecified lacrimal gland: Secondary | ICD-10-CM | POA: Diagnosis present

## 2022-11-29 DIAGNOSIS — E1122 Type 2 diabetes mellitus with diabetic chronic kidney disease: Secondary | ICD-10-CM | POA: Diagnosis present

## 2022-11-29 DIAGNOSIS — E1165 Type 2 diabetes mellitus with hyperglycemia: Secondary | ICD-10-CM | POA: Diagnosis present

## 2022-11-29 DIAGNOSIS — E876 Hypokalemia: Secondary | ICD-10-CM | POA: Diagnosis not present

## 2022-11-29 DIAGNOSIS — J9622 Acute and chronic respiratory failure with hypercapnia: Secondary | ICD-10-CM | POA: Diagnosis not present

## 2022-11-29 DIAGNOSIS — E7800 Pure hypercholesterolemia, unspecified: Secondary | ICD-10-CM | POA: Diagnosis present

## 2022-11-29 DIAGNOSIS — R54 Age-related physical debility: Secondary | ICD-10-CM | POA: Diagnosis present

## 2022-11-29 DIAGNOSIS — N1832 Chronic kidney disease, stage 3b: Secondary | ICD-10-CM | POA: Diagnosis not present

## 2022-11-29 DIAGNOSIS — J4489 Other specified chronic obstructive pulmonary disease: Secondary | ICD-10-CM | POA: Diagnosis present

## 2022-11-29 DIAGNOSIS — Z7901 Long term (current) use of anticoagulants: Secondary | ICD-10-CM

## 2022-11-29 DIAGNOSIS — N3281 Overactive bladder: Secondary | ICD-10-CM | POA: Diagnosis present

## 2022-11-29 DIAGNOSIS — E8770 Fluid overload, unspecified: Secondary | ICD-10-CM

## 2022-11-29 DIAGNOSIS — E871 Hypo-osmolality and hyponatremia: Secondary | ICD-10-CM | POA: Diagnosis not present

## 2022-11-29 DIAGNOSIS — Z86711 Personal history of pulmonary embolism: Secondary | ICD-10-CM

## 2022-11-29 DIAGNOSIS — I2782 Chronic pulmonary embolism: Secondary | ICD-10-CM

## 2022-11-29 DIAGNOSIS — I2699 Other pulmonary embolism without acute cor pulmonale: Secondary | ICD-10-CM | POA: Diagnosis present

## 2022-11-29 DIAGNOSIS — Z7952 Long term (current) use of systemic steroids: Secondary | ICD-10-CM

## 2022-11-29 DIAGNOSIS — Z792 Long term (current) use of antibiotics: Secondary | ICD-10-CM

## 2022-11-29 DIAGNOSIS — M1990 Unspecified osteoarthritis, unspecified site: Secondary | ICD-10-CM | POA: Diagnosis present

## 2022-11-29 DIAGNOSIS — Z7951 Long term (current) use of inhaled steroids: Secondary | ICD-10-CM

## 2022-11-29 DIAGNOSIS — J9601 Acute respiratory failure with hypoxia: Secondary | ICD-10-CM | POA: Diagnosis not present

## 2022-11-29 DIAGNOSIS — K449 Diaphragmatic hernia without obstruction or gangrene: Secondary | ICD-10-CM | POA: Diagnosis present

## 2022-11-29 DIAGNOSIS — I5084 End stage heart failure: Secondary | ICD-10-CM | POA: Diagnosis present

## 2022-11-29 DIAGNOSIS — Z9189 Other specified personal risk factors, not elsewhere classified: Secondary | ICD-10-CM | POA: Diagnosis not present

## 2022-11-29 DIAGNOSIS — J9621 Acute and chronic respiratory failure with hypoxia: Secondary | ICD-10-CM | POA: Diagnosis not present

## 2022-11-29 DIAGNOSIS — G9341 Metabolic encephalopathy: Secondary | ICD-10-CM | POA: Diagnosis present

## 2022-11-29 DIAGNOSIS — B370 Candidal stomatitis: Secondary | ICD-10-CM | POA: Diagnosis present

## 2022-11-29 DIAGNOSIS — J9602 Acute respiratory failure with hypercapnia: Secondary | ICD-10-CM | POA: Diagnosis not present

## 2022-11-29 DIAGNOSIS — G8929 Other chronic pain: Secondary | ICD-10-CM | POA: Diagnosis present

## 2022-11-29 DIAGNOSIS — B379 Candidiasis, unspecified: Secondary | ICD-10-CM | POA: Diagnosis present

## 2022-11-29 DIAGNOSIS — Z7189 Other specified counseling: Secondary | ICD-10-CM | POA: Diagnosis not present

## 2022-11-29 DIAGNOSIS — E119 Type 2 diabetes mellitus without complications: Secondary | ICD-10-CM

## 2022-11-29 DIAGNOSIS — I3139 Other pericardial effusion (noninflammatory): Secondary | ICD-10-CM | POA: Diagnosis present

## 2022-11-29 DIAGNOSIS — N183 Chronic kidney disease, stage 3 unspecified: Secondary | ICD-10-CM | POA: Diagnosis present

## 2022-11-29 DIAGNOSIS — E669 Obesity, unspecified: Secondary | ICD-10-CM | POA: Diagnosis not present

## 2022-11-29 DIAGNOSIS — J441 Chronic obstructive pulmonary disease with (acute) exacerbation: Secondary | ICD-10-CM | POA: Diagnosis present

## 2022-11-29 DIAGNOSIS — Z9981 Dependence on supplemental oxygen: Secondary | ICD-10-CM

## 2022-11-29 DIAGNOSIS — M6281 Muscle weakness (generalized): Secondary | ICD-10-CM | POA: Diagnosis present

## 2022-11-29 DIAGNOSIS — E66811 Obesity, class 1: Secondary | ICD-10-CM

## 2022-11-29 DIAGNOSIS — K219 Gastro-esophageal reflux disease without esophagitis: Secondary | ICD-10-CM | POA: Diagnosis present

## 2022-11-29 DIAGNOSIS — Z888 Allergy status to other drugs, medicaments and biological substances status: Secondary | ICD-10-CM

## 2022-11-29 DIAGNOSIS — Z8672 Personal history of thrombophlebitis: Secondary | ICD-10-CM

## 2022-11-29 DIAGNOSIS — Z833 Family history of diabetes mellitus: Secondary | ICD-10-CM

## 2022-11-29 DIAGNOSIS — Z8249 Family history of ischemic heart disease and other diseases of the circulatory system: Secondary | ICD-10-CM

## 2022-11-29 LAB — CBC WITH DIFFERENTIAL/PLATELET
Abs Immature Granulocytes: 0.1 10*3/uL — ABNORMAL HIGH (ref 0.00–0.07)
Basophils Absolute: 0 10*3/uL (ref 0.0–0.1)
Basophils Relative: 0 %
Eosinophils Absolute: 0 10*3/uL (ref 0.0–0.5)
Eosinophils Relative: 0 %
HCT: 31.7 % — ABNORMAL LOW (ref 36.0–46.0)
Hemoglobin: 10 g/dL — ABNORMAL LOW (ref 12.0–15.0)
Immature Granulocytes: 1 %
Lymphocytes Relative: 7 %
Lymphs Abs: 0.8 10*3/uL (ref 0.7–4.0)
MCH: 27.4 pg (ref 26.0–34.0)
MCHC: 31.5 g/dL (ref 30.0–36.0)
MCV: 86.8 fL (ref 80.0–100.0)
Monocytes Absolute: 1 10*3/uL (ref 0.1–1.0)
Monocytes Relative: 9 %
Neutro Abs: 9.4 10*3/uL — ABNORMAL HIGH (ref 1.7–7.7)
Neutrophils Relative %: 83 %
Platelets: 351 10*3/uL (ref 150–400)
RBC: 3.65 MIL/uL — ABNORMAL LOW (ref 3.87–5.11)
RDW: 16.4 % — ABNORMAL HIGH (ref 11.5–15.5)
WBC: 11.4 10*3/uL — ABNORMAL HIGH (ref 4.0–10.5)
nRBC: 0 % (ref 0.0–0.2)

## 2022-11-29 LAB — URINALYSIS, W/ REFLEX TO CULTURE (INFECTION SUSPECTED)
Bilirubin Urine: NEGATIVE
Glucose, UA: NEGATIVE mg/dL
Ketones, ur: NEGATIVE mg/dL
Nitrite: NEGATIVE
Protein, ur: 100 mg/dL — AB
Specific Gravity, Urine: 1.011 (ref 1.005–1.030)
pH: 6 (ref 5.0–8.0)

## 2022-11-29 LAB — I-STAT CHEM 8, ED
BUN: 15 mg/dL (ref 8–23)
Calcium, Ion: 1.21 mmol/L (ref 1.15–1.40)
Chloride: 81 mmol/L — ABNORMAL LOW (ref 98–111)
Creatinine, Ser: 0.5 mg/dL (ref 0.44–1.00)
Glucose, Bld: 291 mg/dL — ABNORMAL HIGH (ref 70–99)
HCT: 36 % (ref 36.0–46.0)
Hemoglobin: 12.2 g/dL (ref 12.0–15.0)
Potassium: 5 mmol/L (ref 3.5–5.1)
Sodium: 121 mmol/L — ABNORMAL LOW (ref 135–145)
TCO2: 37 mmol/L — ABNORMAL HIGH (ref 22–32)

## 2022-11-29 LAB — COMPREHENSIVE METABOLIC PANEL
ALT: 13 U/L (ref 0–44)
AST: 15 U/L (ref 15–41)
Albumin: 2.2 g/dL — ABNORMAL LOW (ref 3.5–5.0)
Alkaline Phosphatase: 78 U/L (ref 38–126)
Anion gap: 9 (ref 5–15)
BUN: 13 mg/dL (ref 8–23)
CO2: 34 mmol/L — ABNORMAL HIGH (ref 22–32)
Calcium: 9.2 mg/dL (ref 8.9–10.3)
Chloride: 80 mmol/L — ABNORMAL LOW (ref 98–111)
Creatinine, Ser: 0.59 mg/dL (ref 0.44–1.00)
GFR, Estimated: >60 mL/min (ref >60)
Glucose, Bld: 240 mg/dL — ABNORMAL HIGH (ref 70–99)
Potassium: 3.7 mmol/L (ref 3.5–5.1)
Sodium: 123 mmol/L — ABNORMAL LOW (ref 135–145)
Total Bilirubin: 0.5 mg/dL (ref 0.3–1.2)
Total Protein: 6.9 g/dL (ref 6.5–8.1)

## 2022-11-29 LAB — URINE CULTURE

## 2022-11-29 LAB — TROPONIN I (HIGH SENSITIVITY)
Troponin I (High Sensitivity): 21 ng/L — ABNORMAL HIGH (ref <18)
Troponin I (High Sensitivity): 18 ng/L — ABNORMAL HIGH (ref <18)

## 2022-11-29 LAB — BRAIN NATRIURETIC PEPTIDE: B Natriuretic Peptide: 428.2 pg/mL — ABNORMAL HIGH (ref 0.0–100.0)

## 2022-11-29 MED ORDER — FUROSEMIDE 10 MG/ML IJ SOLN
40.0000 mg | Freq: Once | INTRAMUSCULAR | Status: AC
  Administered 2022-11-29: 40 mg via INTRAVENOUS
  Filled 2022-11-29: qty 4

## 2022-11-29 MED ORDER — FUROSEMIDE 10 MG/ML IJ SOLN
40.0000 mg | Freq: Two times a day (BID) | INTRAMUSCULAR | Status: DC
  Administered 2022-11-30: 40 mg via INTRAVENOUS
  Filled 2022-11-29: qty 4

## 2022-11-29 MED ORDER — ALBUTEROL SULFATE (2.5 MG/3ML) 0.083% IN NEBU
2.5000 mg | INHALATION_SOLUTION | Freq: Once | RESPIRATORY_TRACT | Status: AC
  Administered 2022-11-29: 2.5 mg via RESPIRATORY_TRACT
  Filled 2022-11-29: qty 3

## 2022-11-29 MED ORDER — SODIUM CHLORIDE 0.9% FLUSH
3.0000 mL | Freq: Two times a day (BID) | INTRAVENOUS | Status: DC
  Administered 2022-11-30 – 2022-12-08 (×18): 3 mL via INTRAVENOUS

## 2022-11-29 MED ORDER — ONDANSETRON HCL 4 MG/2ML IJ SOLN: 4.0000 mg | Freq: Four times a day (QID) | INTRAMUSCULAR | Status: DC | PRN

## 2022-11-29 MED ORDER — INSULIN ASPART 100 UNIT/ML IJ SOLN
0.0000 [IU] | INTRAMUSCULAR | Status: DC
  Administered 2022-11-30: 3 [IU] via SUBCUTANEOUS
  Administered 2022-11-30: 5 [IU] via SUBCUTANEOUS
  Administered 2022-11-30 (×2): 2 [IU] via SUBCUTANEOUS
  Administered 2022-11-30: 3 [IU] via SUBCUTANEOUS
  Administered 2022-11-30: 2 [IU] via SUBCUTANEOUS
  Administered 2022-12-01: 3 [IU] via SUBCUTANEOUS

## 2022-11-29 MED ORDER — ACETAMINOPHEN 325 MG PO TABS: 650.0000 mg | ORAL_TABLET | ORAL | Status: DC | PRN

## 2022-11-29 MED ORDER — SODIUM CHLORIDE 0.9% FLUSH: 3.0000 mL | INTRAVENOUS | Status: DC | PRN

## 2022-11-29 MED ORDER — SODIUM CHLORIDE 0.9 % IV SOLN: 250.0000 mL | INTRAVENOUS | Status: DC | PRN

## 2022-11-29 NOTE — ED Triage Notes (Addendum)
Pt BIB EMS from [facility], for sodium of 124 per facility. Pt denies complaints, other than be "a little more tired than normal". On 2L [place] baseline, BIL LE edema. Daughter states pt has been seen for the same multiple times. Pt appears ShOB while speaking, speaking in short phrases. Daughter states that pt was increased to 3L at facility due to O2 sat being in the 70's. Pt currently on 2L [place].   EMS VS:  178/86 97% 2L [place] HR 90 CBG 284

## 2022-11-29 NOTE — ED Provider Notes (Signed)
[place] EMERGENCY DEPARTMENT AT [facility] Provider Note   CSN: [id number] Arrival date & time: 11/30/2022  1507     History  Chief Complaint  Patient presents with   Abnormal Lab    Heidi Dominguez is a 89 y.o. female.  HPI 89 year old female with a history of diastolic CHF, CKD, recent PE, and multiple other comorbidities presents with hyponatremia and dyspnea.  History is primarily from the daughter at the bedside.  Patient has been in and out of the ER and hospital multiple times in the last few months.  This most recent time she has been confused more than normal.  This has been ever since she came into the hospital.  She is not as sharp as she used to be.  Otherwise, her sodium has been a problem where it keeps coming up when she comes in the hospital and then going back down when she is at the facility.  Daughter is concerned she has not been getting her medications correctly.  Today her sodium was apparently 123 and she was sent here.  The daughter feels like the leg swelling is worse than it was when she was in the hospital.  She is more short of breath and her O2 sats despite being on her 2 L she was discharged with last week, went down to 60s and she had to have her oxygen increased.  Patient denies any active chest pain. She's been having vomiting for a long time, attributed to a hiatal hernia per the daughter.  Home Medications Prior to Admission medications   Medication Sig Start Date End Date Taking? Authorizing Provider  acetaminophen (TYLENOL) 325 MG tablet Take 650 mg by mouth 3 (three) times daily.    [provider]  acetaZOLAMIDE (DIAMOX) 125 MG tablet Take 1 tablet (125 mg total) by mouth 2 (two) times daily. 11/22/22   Rhetta Mura, MD  albuterol (VENTOLIN HFA) 108 (90 Base) MCG/ACT inhaler Inhale 2 puffs into the lungs every 6 (six) hours as needed for wheezing or shortness of breath.    [provider]  amLODipine (NORVASC) 5 MG  tablet Take 1 tablet (5 mg total) by mouth daily. 11/23/22   Rhetta Mura, MD  apixaban (ELIQUIS) 5 MG TABS tablet Take 2 tablets (10 mg total) by mouth 2 (two) times daily for 3 days. 11/22/22 11/25/22  Rhetta Mura, MD  apixaban (ELIQUIS) 5 MG TABS tablet Take 1 tablet (5 mg total) by mouth 2 (two) times daily. 11/25/22   Rhetta Mura, MD  budesonide (PULMICORT) 0.5 MG/2ML nebulizer solution Take 2 mLs (0.5 mg total) by nebulization in the morning and at bedtime. 10/16/22   Cobb, Ruby Cola, NP  cefadroxil (DURICEF) 500 MG capsule Take 2 capsules (1,000 mg total) by mouth 2 (two) times daily for 23 days. 11/22/22 12/15/22  Rhetta Mura, MD  cefadroxil (DURICEF) 500 MG capsule Take 1 capsule (500 mg total) by mouth 2 (two) times daily. 12/16/22   Rhetta Mura, MD  Cholecalciferol (VITAMIN D3) 25 MCG (1000 UT) capsule Take 1,000 Units by mouth every Monday.    [provider]  dextromethorphan-guaiFENesin (MUCINEX DM) 30-600 MG 12hr tablet Take 1 tablet by mouth 2 (two) times daily. 10/16/22   Cobb, Ruby Cola, NP  diclofenac Sodium (VOLTAREN ARTHRITIS PAIN) 1 % GEL Apply 2 g topically 3 (three) times daily. To both shoulders.    [provider]  furosemide (LASIX) 40 MG tablet Take 1 tablet (40 mg total) by mouth  daily. 11/23/22   Rhetta Mura, MD  hypromellose (GENTEAL SEVERE) 0.3 % GEL ophthalmic ointment Place 1 Application into both eyes 2 (two) times daily.    [provider]  insulin lispro (HUMALOG) 100 UNIT/ML injection Inject 0-12 Units into the skin 3 (three) times daily before meals. Per sliding scale: If blood sugar is less than 70, call NP/PA. If blood sugar is 70 to 200, give 0 units. If blood sugar is 201 to 250, give 2 units. If blood sugar is 251 to 300, give 4 units. If blood sugar is 301 to 350, give 6 units. If blood sugar is 351 to 400, give 8 units. If blood sugar is 401 to 450, give 10 units. If blood sugar is  451 to 600, give 12 units. Recheck in 2 hours.    [provider]  Lactobacillus (PROBIOTIC ACIDOPHILUS) CAPS Take 1 capsule by mouth at bedtime.    [provider]  LEVEMIR FLEXPEN 100 UNIT/ML FlexPen Inject 5 Units into the skin daily. 11/02/22   [provider]  Multiple Vitamins-Minerals (PRESERVISION AREDS) CAPS Take 1 capsule by mouth daily.    [provider]  OMEPRAZOLE PO Take 20 mg by mouth daily. Omeprazole 20 mg DR disintegrating tablet.    [provider]  ondansetron (ZOFRAN) 4 MG tablet Take 4 mg by mouth every 8 (eight) hours as needed for nausea or vomiting.    [provider]  Polyethyl Glycol-Propyl Glycol (LUBRICANT EYE DROPS) 0.4-0.3 % SOLN Place 2 drops into both eyes daily.    [provider]  polyethylene glycol powder (GLYCOLAX/MIRALAX) 17 GM/SCOOP powder Take 17 g by mouth at bedtime.    [provider]  polyvinyl alcohol (LIQUIFILM TEARS) 1.4 % ophthalmic solution Place 1 drop into both eyes in the morning, at noon, in the evening, and at bedtime. Following injection treatment    [provider]  predniSONE (DELTASONE) 1 MG tablet Take 2 tablets (2 mg total) by mouth daily with breakfast. 11/23/22   Rhetta Mura, MD      Allergies    Augmentin [amoxicillin-pot clavulanate], Bactrim [sulfamethoxazole-trimethoprim], Calan [verapamil], Catapres [clonidine hcl], Codeine, Hydrocodone, Other, Sulfa antibiotics, Zestril [lisinopril], and Azulfidine [sulfasalazine]    Review of Systems   Review of Systems  Constitutional:  Negative for fever.  Respiratory:  Positive for shortness of breath.   Cardiovascular:  Positive for leg swelling. Negative for chest pain.  Gastrointestinal:  Positive for vomiting. Negative for abdominal pain.  Psychiatric/Behavioral:  Positive for confusion.     Physical Exam Updated Vital Signs BP (!) 153/64   Pulse 82   Temp 98 F (36.7 C) (Oral)   Resp (!)  34   SpO2 98%  Physical Exam Vitals and nursing note reviewed.  Constitutional:      General: She is not in acute distress.    Appearance: She is well-developed. She is not ill-appearing or diaphoretic.  HENT:     Head: Normocephalic and atraumatic.  Neck:     Vascular: JVD present.  Cardiovascular:     Rate and Rhythm: Normal rate and regular rhythm.     Heart sounds: Murmur heard.  Pulmonary:     Effort: Pulmonary effort is normal. Tachypnea present. No respiratory distress.     Breath sounds: Rales present.  Abdominal:     Palpations: Abdomen is soft.     Tenderness: There is no abdominal tenderness.  Musculoskeletal:     Right lower leg: Edema present.  Left lower leg: Edema present.  Skin:    General: Skin is warm and dry.  Neurological:     Mental Status: She is alert.     Comments: Patient is alert and oriented to person, place, time. Equal strength in her extremities     ED Results / Procedures / Treatments   Labs (all labs ordered are listed, but only abnormal results are displayed) Labs Reviewed  BRAIN NATRIURETIC PEPTIDE - Abnormal; Notable for the following components:      Result Value   B Natriuretic Peptide 428.2 (*)    All other components within normal limits  CBC WITH DIFFERENTIAL/PLATELET - Abnormal; Notable for the following components:   WBC 11.4 (*)    RBC 3.65 (*)    Hemoglobin 10.0 (*)    HCT 31.7 (*)    RDW 16.4 (*)    Neutro Abs 9.4 (*)    Abs Immature Granulocytes 0.10 (*)    All other components within normal limits  I-STAT CHEM 8, ED - Abnormal; Notable for the following components:   Sodium 121 (*)    Chloride 81 (*)    Glucose, Bld 291 (*)    TCO2 37 (*)    All other components within normal limits  TROPONIN I (HIGH SENSITIVITY) - Abnormal; Notable for the following components:   Troponin I (High Sensitivity) 21 (*)    All other components within normal limits  TROPONIN I (HIGH SENSITIVITY) - Abnormal; Notable for the  following components:   Troponin I (High Sensitivity) 18 (*)    All other components within normal limits  URINALYSIS, W/ REFLEX TO CULTURE (INFECTION SUSPECTED)  COMPREHENSIVE METABOLIC PANEL  COMPREHENSIVE METABOLIC PANEL    EKG EKG Interpretation  Date/Time:  Wednesday November 29 2022 16:28:01 EDT Ventricular Rate:  90 PR Interval:  184 QRS Duration: 84 QT Interval:  348 QTC Calculation: 426 R Axis:   58 Text Interpretation: Sinus rhythm Probable left atrial enlargement Borderline T abnormalities, inferior leads Confirmed by Pricilla Loveless [phone]) on 11/17/2022 4:34:34 PM  Radiology DG Chest Portable 1 View  Result Date: 11/15/2022 CLINICAL DATA:  CHF EXAM: PORTABLE CHEST 1 VIEW COMPARISON:  CXR 11/17/22 FINDINGS: Cardiomegaly. Small right pleural effusion. Likely layering left-sided pleural effusion, unchanged from prior exam. There prominent bilateral opacities slightly improved from prior exam, likely suggestive of resolving pulmonary edema or pulmonary venous congestion. No new focal airspace opacity. Unchanged mediastinal contours. No radiographically apparent displaced rib fractures. Bilateral shoulder arthroplasties. IMPRESSION: 1. Small right pleural effusion. Likely layering left-sided pleural effusion, unchanged from prior exam. 2. Prominent bilateral opacities slightly improved from prior exam, likely suggestive of resolving pulmonary edema or pulmonary venous congestion. Electronically Signed   By: Lorenza Cambridge M.D.   On: 11/16/2022 17:27   CT Head Wo Contrast  Result Date: 12/01/2022 CLINICAL DATA:  Altered mental status EXAM: CT HEAD WITHOUT CONTRAST TECHNIQUE: Contiguous axial images were obtained from the base of the skull through the vertex without intravenous contrast. RADIATION DOSE REDUCTION: This exam was performed according to the departmental dose-optimization program which includes automated exposure control, adjustment of the mA and/or kV according to patient size  and/or use of iterative reconstruction technique. COMPARISON:  None Available.  11/18/2022 FINDINGS: Brain: No evidence of acute infarction, hemorrhage, mass, mass effect, or midline shift. No hydrocephalus or extra-axial fluid collection. Periventricular white matter changes, likely the sequela of chronic small vessel ischemic disease. Remote lacunar infarct in the right pons Vascular: No hyperdense vessel. Atherosclerotic calcifications in  the intracranial carotid and vertebral arteries. Skull: Negative for fracture or focal lesion. Sinuses/Orbits: No acute finding. Status post bilateral lens replacements. Other: The mastoid air cells are well aerated. IMPRESSION: No acute intracranial process. Electronically Signed   By: Wiliam Ke M.D.   On: 11/28/2022 17:01    Procedures .Critical Care  Performed by: Pricilla Loveless, MD Authorized by: Pricilla Loveless, MD   Critical care provider statement:    Critical care time (minutes):  35   Critical care time was exclusive of:  Separately billable procedures and treating other patients   Critical care was necessary to treat or prevent imminent or life-threatening deterioration of the following conditions:  Metabolic crisis and respiratory failure   Critical care was time spent personally by me on the following activities:  Development of treatment plan with patient or surrogate, discussions with consultants, evaluation of patient's response to treatment, examination of patient, ordering and review of laboratory studies, ordering and review of radiographic studies, ordering and performing treatments and interventions, pulse oximetry, re-evaluation of patient's condition and review of old charts     Medications Ordered in ED Medications  furosemide (LASIX) injection 40 mg (40 mg Intravenous Given 11/21/2022 1830)  albuterol (PROVENTIL) (2.5 MG/3ML) 0.083% nebulizer solution 2.5 mg (2.5 mg Nebulization Given 12/12/2022 1940)    ED Course/ Medical Decision  Making/ A&P                             Medical Decision Making Amount and/or Complexity of Data Reviewed Labs: ordered.    Details: Sodium 121.  BNP elevated at 420. Radiology: ordered and independent interpretation performed.    Details: Likely CHF.  CT head unremarkable. ECG/medicine tests: ordered and independent interpretation performed.    Details: Nonspecific T waves but no acute ischemia.  Risk Prescription drug management. Decision regarding hospitalization.   On presentation patient appears hypervolemic and in CHF.  I suspect this is the main contributor to her hyponatremia which is acute on chronic.  Upon discharge last week her sodium was 127.  It is worse today and so we will give her some diuresis.  Otherwise, she does appear to have some shortness of breath and has developed a little wheezing which might be fluid but she also has a COPD history so we will give a albuterol nebulizer.  Otherwise, she might be a little more fatigued/confused because of her sodium but it sounds like her confusion has been for over a week.  Will admit to the hospital service, discussed with Dr. Joneen Roach.        Final Clinical Impression(s) / ED Diagnoses Final diagnoses:  Acute on chronic congestive heart failure, unspecified heart failure type  Hyponatremia    Rx / DC Orders ED Discharge Orders     None         Pricilla Loveless, MD 11/28/2022 2149

## 2022-11-29 NOTE — H&P (Incomplete)
PCP:   Merlene Laughter, MD   Chief Complaint:  Mentation  HPI: This is a 89 y.o. female with PMHx significant for chronic hyponatremia, HFpEF, severe aortic stenosis, dysphagia, asthma, normocytic anemia, T2DM, GERD with large hiatal hernia and recurrent nausea and vomiting, HTN, HLD, recent admission for right shoulder PJI, and PE. Patient has had several recurrent admissions since February 2024.  Each with different diagnosis including RSV infection, right shoulder shoulder PJI, pulmonary embolism, each admission with underlying hyponatremia.   Patient resides at independent living center.  He was brought into the ER for altered mentation.  Per daughter patient has been weak, lethargic, nauseous and vomiting.  Patient has baseline nausea, vomiting secondary to a large hiatal hernia however this has been worse since being on oral antibiotics.  Per daughter patient has significant thrush which recently is always present in her emesis.  She has been doing oral care, and facility was started her on nystatin swish and spit.  Patient's p.o. intake has been very poor.  She has gotten very weak and is unable to partake in physical therapy.  Per daughter the patient keeps stating her sodium is going low and she knows it because her energy is down.  Lab work done at the facility showed sodium of 121, she was sent to the ER.  Patient's daughter present at bedside is very concerned.  Prior to February patient had very clear mentation and was able to discuss politics etc. patient was mentally sharp.  Since her last 2 admissions patient has gotten weak and progressively confused.  Patient's daughter is concerned for premature discharges, citing, patient being discharged previously while having trouble breathing.  On arrival to the nursing home, patient dyspneic, oxygen sats in the 60s.  She was placed on oxygen and her sats remained in the 80s.  She was transported immediately back to the ER and diagnosed with  pulmonary embolism.  Daughter very concerned, stating she does not have a rose-colored glasses she knows her mom is 25 years of age, however, at this point she would like her treatment optimized.  She is concerned her mom is being written off due to her age.  She has made her mom DNR.  She is concerned patient has not had a cardiology consult given her recent admissions for fluid overload and congestive heart failure.  There is no reports of fevers or chills, diarrhea  History provided by patient's daughter, her two nieces both Drs. Edwards [EDP and GI in [place]], and patients sister.  Patient with a very concerned family   Review of Systems:  Unable to obtain due to patient's altered mentation and somnolent state,  Past Medical History: Past Medical History:  Diagnosis Date   Acute pancreatitis    hx of    Arthritis    "qwhere" (07/04/2018)   Chronic back pain    "all over" (07/04/2018)   Chronic diastolic CHF (congestive heart failure)    Chronic kidney disease    stage  3 chronic kidney disease    Complication of anesthesia    "I have a hard time waking up"   Degenerative joint disease of shoulder region    Dry eye syndrome    Dyspnea    Family history of adverse reaction to anesthesia    "daughter has the shakes when she wakes up"   GERD (gastroesophageal reflux disease)    Heart murmur    High cholesterol    History of hiatal hernia    HOH (hard  of hearing)    Hypertension    Impacted cerumen of both ears    hx of    Muscle weakness (generalized)    Osteoarthritis    Overactive bladder    Pancreatitis    hx of    Phlebitis    "BLE"   Rotator cuff tear    right    Tear of right supraspinatus tendon    Thrombocytopenia    hx of    Type II diabetes mellitus    Urinary tract infection    hx of    UTI (urinary tract infection) 09/12/2018   Xerosis cutis    hx of    Past Surgical History:  Procedure Laterality Date   ABDOMINAL AORTOGRAM W/LOWER EXTREMITY N/A  12/22/2020   Procedure: ABDOMINAL AORTOGRAM W/LOWER EXTREMITY;  Surgeon: Leonie Douglas, MD;  Location: [facility];  Service: Cardiovascular;  Laterality: N/A;   ABDOMINAL HYSTERECTOMY     BACK SURGERY     CATARACT EXTRACTION W/ INTRAOCULAR LENS  IMPLANT, BILATERAL Bilateral    CHOLECYSTECTOMY  2016   ERCP N/A 08/30/2018   Procedure: ENDOSCOPIC RETROGRADE CHOLANGIOPANCREATOGRAPHY (ERCP);  Surgeon: Vida Rigger, MD;  Location: [facility];  Service: Endoscopy;  Laterality: N/A;   FIXATION KYPHOPLASTY     INCISION AND DRAINAGE Right 11/08/2018   Procedure: INCISION AND DRAINAGE right shoulder, placement of antibiotic beads ;  Surgeon: Beverely Low, MD;  Location: [facility];  Service: Orthopedics;  Laterality: Right;   IR US GUIDE BX ASP/DRAIN  11/04/2022   IR US GUIDE BX ASP/DRAIN  11/04/2022   JOINT REPLACEMENT     LAPAROSCOPIC CHOLECYSTECTOMY SINGLE SITE WITH INTRAOPERATIVE CHOLANGIOGRAM N/A 04/11/2015   Procedure: LAPAROSCOPIC LYSIS OF ADHESIONS, LAPAROSCOPIC CHOLECYSTECTOMY WITH INTRAOPERATIVE CHOLANGIOGRAM;  Surgeon: Karie Soda, MD;  Location: [facility];  Service: General;  Laterality: N/A;   PANCREATIC STENT PLACEMENT  08/30/2018   Procedure: PANCREATIC STENT PLACEMENT;  Surgeon: Vida Rigger, MD;  Location: [facility];  Service: Endoscopy;;   PERIPHERAL VASCULAR INTERVENTION  12/22/2020   Procedure: PERIPHERAL VASCULAR INTERVENTION;  Surgeon: Leonie Douglas, MD;  Location: [facility];  Service: Cardiovascular;;  left anterior tibial artery   REMOVAL OF STONES  08/30/2018   Procedure: REMOVAL OF STONES;  Surgeon: Vida Rigger, MD;  Location: [facility];  Service: Endoscopy;;   SHOULDER OPEN ROTATOR CUFF REPAIR Bilateral    SPHINCTEROTOMY  08/30/2018   Procedure: SPHINCTEROTOMY;  Surgeon: Vida Rigger, MD;  Location: [facility];  Service: Endoscopy;;   TOTAL KNEE ARTHROPLASTY Bilateral     Medications: Prior to Admission medications   Medication Sig Start Date End Date  Taking? Authorizing Provider  acetaminophen (TYLENOL) 325 MG tablet Take 650 mg by mouth 3 (three) times daily.    [provider]  acetaZOLAMIDE (DIAMOX) 125 MG tablet Take 1 tablet (125 mg total) by mouth 2 (two) times daily. 11/22/22   Rhetta Mura, MD  albuterol (VENTOLIN HFA) 108 (90 Base) MCG/ACT inhaler Inhale 2 puffs into the lungs every 6 (six) hours as needed for wheezing or shortness of breath.    [provider]  amLODipine (NORVASC) 5 MG tablet Take 1 tablet (5 mg total) by mouth daily. 11/23/22   Rhetta Mura, MD  apixaban (ELIQUIS) 5 MG TABS tablet Take 2 tablets (10 mg total) by mouth 2 (two) times daily for 3 days. 11/22/22 11/25/22  Rhetta Mura, MD  apixaban (ELIQUIS) 5 MG TABS tablet Take 1 tablet (5 mg total) by mouth 2 (two) times  daily. 11/25/22   Rhetta Mura, MD  budesonide (PULMICORT) 0.5 MG/2ML nebulizer solution Take 2 mLs (0.5 mg total) by nebulization in the morning and at bedtime. 10/16/22   Cobb, Ruby Cola, NP  cefadroxil (DURICEF) 500 MG capsule Take 2 capsules (1,000 mg total) by mouth 2 (two) times daily for 23 days. 11/22/22 12/15/22  Rhetta Mura, MD  cefadroxil (DURICEF) 500 MG capsule Take 1 capsule (500 mg total) by mouth 2 (two) times daily. 12/16/22   Rhetta Mura, MD  Cholecalciferol (VITAMIN D3) 25 MCG (1000 UT) capsule Take 1,000 Units by mouth every Monday.    [provider]  dextromethorphan-guaiFENesin (MUCINEX DM) 30-600 MG 12hr tablet Take 1 tablet by mouth 2 (two) times daily. 10/16/22   Cobb, Ruby Cola, NP  diclofenac Sodium (VOLTAREN ARTHRITIS PAIN) 1 % GEL Apply 2 g topically 3 (three) times daily. To both shoulders.    [provider]  furosemide (LASIX) 40 MG tablet Take 1 tablet (40 mg total) by mouth daily. 11/23/22   Rhetta Mura, MD  hypromellose (GENTEAL SEVERE) 0.3 % GEL ophthalmic ointment Place 1 Application into both eyes 2 (two) times daily.    [provider]  insulin lispro (HUMALOG) 100 UNIT/ML injection Inject 0-12 Units into the skin 3 (three) times daily before meals. Per sliding scale: If blood sugar is less than 70, call NP/PA. If blood sugar is 70 to 200, give 0 units. If blood sugar is 201 to 250, give 2 units. If blood sugar is 251 to 300, give 4 units. If blood sugar is 301 to 350, give 6 units. If blood sugar is 351 to 400, give 8 units. If blood sugar is 401 to 450, give 10 units. If blood sugar is 451 to 600, give 12 units. Recheck in 2 hours.    [provider]  Lactobacillus (PROBIOTIC ACIDOPHILUS) CAPS Take 1 capsule by mouth at bedtime.    [provider]  LEVEMIR FLEXPEN 100 UNIT/ML FlexPen Inject 5 Units into the skin daily. 11/02/22   [provider]  Multiple Vitamins-Minerals (PRESERVISION AREDS) CAPS Take 1 capsule by mouth daily.    [provider]  OMEPRAZOLE PO Take 20 mg by mouth daily. Omeprazole 20 mg DR disintegrating tablet.    [provider]  ondansetron (ZOFRAN) 4 MG tablet Take 4 mg by mouth every 8 (eight) hours as needed for nausea or vomiting.    [provider]  Polyethyl Glycol-Propyl Glycol (LUBRICANT EYE DROPS) 0.4-0.3 % SOLN Place 2 drops into both eyes daily.    [provider]  polyethylene glycol powder (GLYCOLAX/MIRALAX) 17 GM/SCOOP powder Take 17 g by mouth at bedtime.    [provider]  polyvinyl alcohol (LIQUIFILM TEARS) 1.4 % ophthalmic solution Place 1 drop into both eyes in the morning, at noon, in the evening, and at bedtime. Following injection treatment    [provider]  predniSONE (DELTASONE) 1 MG tablet Take 2 tablets (2 mg total) by mouth daily with breakfast. 11/23/22   Rhetta Mura, MD    Allergies:   Allergies  Allergen Reactions   Augmentin [Amoxicillin-Pot Clavulanate] Diarrhea   Bactrim [Sulfamethoxazole-Trimethoprim] Diarrhea and Itching   Calan [Verapamil] Diarrhea    Catapres [Clonidine Hcl] Other (See Comments)    Unknown reaction Not documented on MAR    Codeine Other (See Comments)    "Makes me out of my head, loopy"   Hydrocodone Other (See Comments)    "makes me go out of my head,  loopy"   Other Other (See Comments)    Any type of narcotics Feels "loopy"    Sulfa Antibiotics Itching   Zestril [Lisinopril] Cough   Azulfidine [Sulfasalazine] Itching    Social History:  reports that she has never smoked. She has never used smokeless tobacco. She reports that she does not drink alcohol and does not use drugs.  Family History: Family History  Problem Relation Age of Onset   Hypertension Mother    Diabetes Mellitus I Mother    Hypertension Father     Physical Exam: Vitals:   11/28/2022 1730 12/04/2022 1815 11/18/2022 1830 11/24/2022 1938  BP: (!) 160/72 (!) 160/80  (!) 153/64  Pulse: 88 84  82  Resp: (!) 21 (!) 34  (!) 34  Temp:   98 F (36.7 C)   TempSrc:   Oral   SpO2: 100% 99%  98%    General: Arousable but somnolent, well developed and nourished, no acute distress Eyes: PERRLA, pink conjunctiva, no scleral icterus ENT: Moist oral mucosa, neck supple, no thyromegaly, +++JVD Lungs: clear to ascultation, no wheeze, no crackles, no use of accessory muscles Cardiovascular: regular rate and rhythm, no regurgitation, no gallops, +++murmur . No carotid bruits, no JVD Abdomen: soft, positive BS, non-tender, non-distended, no organomegaly, not an acute abdomen GU: not examined Neuro: Unable to properly assess due to patient's mentation Musculoskeletal: 2+ B/L pitting edema Skin: no rash, no subcutaneous crepitation, no decubitus Psych: Somnolent patient  Labs on Admission:  Recent Labs    11/16/2022 1638  NA 121*  K 5.0  CL 81*  GLUCOSE 291*  BUN 15  CREATININE 0.50    Recent Labs    11/28/2022 1533 12/11/2022 1638  WBC 11.4*  --   NEUTROABS 9.4*  --   HGB 10.0* 12.2  HCT 31.7* 36.0  MCV 86.8  --   PLT 351  --       Radiological Exams on Admission: DG Chest Portable 1 View  Result Date: 11/23/2022 CLINICAL DATA:  CHF EXAM: PORTABLE CHEST 1 VIEW COMPARISON:  CXR 11/17/22 FINDINGS: Cardiomegaly. Small right pleural effusion. Likely layering left-sided pleural effusion, unchanged from prior exam. There prominent bilateral opacities slightly improved from prior exam, likely suggestive of resolving pulmonary edema or pulmonary venous congestion. No new focal airspace opacity. Unchanged mediastinal contours. No radiographically apparent displaced rib fractures. Bilateral shoulder arthroplasties. IMPRESSION: 1. Small right pleural effusion. Likely layering left-sided pleural effusion, unchanged from prior exam. 2. Prominent bilateral opacities slightly improved from prior exam, likely suggestive of resolving pulmonary edema or pulmonary venous congestion. Electronically Signed   By: Lorenza Cambridge M.D.   On: 11/19/2022 17:27   CT Head Wo Contrast  Result Date: 11/17/2022 CLINICAL DATA:  Altered mental status EXAM: CT HEAD WITHOUT CONTRAST TECHNIQUE: Contiguous axial images were obtained from the base of the skull through the vertex without intravenous contrast. RADIATION DOSE REDUCTION: This exam was performed according to the departmental dose-optimization program which includes automated exposure control, adjustment of the mA and/or kV according to patient size and/or use of iterative reconstruction technique. COMPARISON:  None Available.  11/18/2022 FINDINGS: Brain: No evidence of acute infarction, hemorrhage, mass, mass effect, or midline shift. No hydrocephalus or extra-axial fluid collection. Periventricular white matter changes, likely the sequela of chronic small vessel ischemic disease. Remote lacunar infarct in the right pons Vascular: No hyperdense vessel. Atherosclerotic calcifications in the intracranial carotid and vertebral arteries. Skull: Negative for fracture or focal lesion. Sinuses/Orbits: No acute  finding. Status  post bilateral lens replacements. Other: The mastoid air cells are well aerated. IMPRESSION: No acute intracranial process. Electronically Signed   By: Wiliam Ke M.D.   On: 11/21/2022 17:01    Assessment/Plan Present on Admission:  Acute on chronic hypoosmolar hyponatremia in a hypervolemic patient  -Admit to progressive care -Nephrology consult placed -Continue IV Lasix 40 mg BID x 24hrs, adjustment per primary team/cardiology.  -Fluid restrict, strict I's and O's -BMP in a.m. -?sodium tab or tolvaptam   Severe Aortic stenosis (echo done 11/09/22)/ Acute on chronic diastolic CHF (congestive heart failure), preserved EF -patients severe AS likely precipitating her heart failure. Patient is a poor candidate for surgical intervention given age, PJI and comorbidities. -IV Lasix initiated, monitor fluid balance with severe AS  -CHF order set initiated -Strict I's and O's, daily weights -Oxygen as needed -with her severe AS, rec mild physical activity. Limit PT -Patient's daughter concerned as to why patient's back so quickly despite being on Lasix.  Patient discharged on 20 mg of Lasix p.o. daily.  Explained to daughter, given patient's severe AS, a less aggressive stance likely been taken for fluid balance.   -IV Lasix 40 mg IV twice daily initiated for 24hrs, decreased qdaily.  Patient had been on this prior admission and tolerated.  -Cardiology consult placed   Confusion/metabolic encephalopathy -Likely multifactorial, related to patient's recent multiple admission since 2/24 with hyponatremia, RSV infection, PJI shoulder infection, severe AS => CHF with hypoxemia, pulm embolism, poor p.o. intake, age, ongoing debility, now FTT.  This has been discussed with patient's daughter. Per daughter if this is her new normal she can accept but she needs to know all stones are turned and consultants have weighed in, especially given how precipitous her moms decline has been. She states  she wants to know her mother is not being written of based solely her her age. -Continue treating underlying conditions   Possible UTI -UA with large leukocytes, rare bacteria but no nitrates -Patient is on antibiotics, will await culture results prior to any antibiotic change  ?  Oral thrush -Patient diabetic poorly controlled, maintained on chronic prednisone 1 mg daily for respiratory issues, maintained on chronic antibiotic therapy, on Pulmicort, recurrent nausea and vomiting.  High risk for thrush.  Daughter would like IV antifungal.  Will go ahead and order given the fact that patient is a poor candidate for any intervention/procedures -Currently patient's mouth without evidence of thrush however stated states she has been cleaning pretty consistently and patient has been started swish and spit   Hiatal hernia/ nausea vomiting/ GERD -Continue omeprazole, Zofran -Continue probiotic   COPD  -Continue Pulmicort, albuterol.     Diabetes mellitus type 2, uncontrolled -Sliding scale insulin   Essential hypertension -Continue Norvasc 5 mg daily   Peripheral artery disease -s/p left ATA stenting and angioplasty  12/22/20    Pulmonary embolism -Continue Eliquis   Right shoulder prosthetic joint infection -Admitted 3/22-4/5 for Right shoulder prosthetic joint infection  -Discharge recommendation continue cefadroxil 1000 mg BID x 6 weeks, end of treatment 12/15/22, then transition to cefadroxil 500 mg po BID indefinitely for palliative suppression given hardware in place  -Antibiotics have not been adjusted.  Defer to a.m. team/ID discretion -Patient follows with Dr. Nobie Putnam for Infectious Disease     Dysphagia, nausea and vomiting -Has been evaluated by SLP and GI prior admission, speech therapy recommend normal consistency diet with thin liquids.  They recommend meds in puree and twice daily oral care  Eduar Kumpf 12/05/2022, 8:27 PM

## 2022-11-29 NOTE — ED Notes (Signed)
Pt to CT

## 2022-11-29 NOTE — Progress Notes (Signed)
RT attempted ABG X2 without success.  Patient sating 100% on 4L.  VBG recommended.

## 2022-11-30 ENCOUNTER — Ambulatory Visit: Payer: Medicare PPO | Admitting: Nurse Practitioner

## 2022-11-30 ENCOUNTER — Inpatient Hospital Stay (HOSPITAL_COMMUNITY): Payer: Medicare PPO

## 2022-11-30 DIAGNOSIS — G9341 Metabolic encephalopathy: Secondary | ICD-10-CM

## 2022-11-30 DIAGNOSIS — I10 Essential (primary) hypertension: Secondary | ICD-10-CM | POA: Diagnosis not present

## 2022-11-30 DIAGNOSIS — N1832 Chronic kidney disease, stage 3b: Secondary | ICD-10-CM | POA: Diagnosis not present

## 2022-11-30 DIAGNOSIS — I5033 Acute on chronic diastolic (congestive) heart failure: Secondary | ICD-10-CM | POA: Diagnosis not present

## 2022-11-30 DIAGNOSIS — I2782 Chronic pulmonary embolism: Secondary | ICD-10-CM | POA: Diagnosis not present

## 2022-11-30 DIAGNOSIS — E669 Obesity, unspecified: Secondary | ICD-10-CM

## 2022-11-30 DIAGNOSIS — I350 Nonrheumatic aortic (valve) stenosis: Secondary | ICD-10-CM | POA: Diagnosis not present

## 2022-11-30 DIAGNOSIS — I509 Heart failure, unspecified: Secondary | ICD-10-CM | POA: Insufficient documentation

## 2022-11-30 LAB — CBC WITH DIFFERENTIAL/PLATELET
Abs Immature Granulocytes: 0.09 10*3/uL — ABNORMAL HIGH (ref 0.00–0.07)
Basophils Absolute: 0 10*3/uL (ref 0.0–0.1)
Basophils Relative: 0 %
Eosinophils Absolute: 0.1 10*3/uL (ref 0.0–0.5)
Eosinophils Relative: 1 %
HCT: 30.7 % — ABNORMAL LOW (ref 36.0–46.0)
Hemoglobin: 9.1 g/dL — ABNORMAL LOW (ref 12.0–15.0)
Immature Granulocytes: 1 %
Lymphocytes Relative: 6 %
Lymphs Abs: 0.6 10*3/uL — ABNORMAL LOW (ref 0.7–4.0)
MCH: 26.5 pg (ref 26.0–34.0)
MCHC: 29.6 g/dL — ABNORMAL LOW (ref 30.0–36.0)
MCV: 89.2 fL (ref 80.0–100.0)
Monocytes Absolute: 1.1 10*3/uL — ABNORMAL HIGH (ref 0.1–1.0)
Monocytes Relative: 10 %
Neutro Abs: 8.9 10*3/uL — ABNORMAL HIGH (ref 1.7–7.7)
Neutrophils Relative %: 82 %
Platelets: 348 10*3/uL (ref 150–400)
RBC: 3.44 MIL/uL — ABNORMAL LOW (ref 3.87–5.11)
RDW: 16.5 % — ABNORMAL HIGH (ref 11.5–15.5)
WBC: 10.9 10*3/uL — ABNORMAL HIGH (ref 4.0–10.5)
nRBC: 0 % (ref 0.0–0.2)

## 2022-11-30 LAB — BLOOD GAS, ARTERIAL
Acid-Base Excess: 9.4 mmol/L — ABNORMAL HIGH (ref 0.0–2.0)
Bicarbonate: 39.4 mmol/L — ABNORMAL HIGH (ref 20.0–28.0)
O2 Saturation: 94.4 %
Patient temperature: 37.2
pCO2 arterial: 93 mmHg (ref 32–48)
pH, Arterial: 7.24 — ABNORMAL LOW (ref 7.35–7.45)
pO2, Arterial: 68 mmHg — ABNORMAL LOW (ref 83–108)

## 2022-11-30 LAB — GLUCOSE, CAPILLARY
Glucose-Capillary: 146 mg/dL — ABNORMAL HIGH (ref 70–99)
Glucose-Capillary: 159 mg/dL — ABNORMAL HIGH (ref 70–99)

## 2022-11-30 LAB — CBG MONITORING, ED
Glucose-Capillary: 133 mg/dL — ABNORMAL HIGH (ref 70–99)
Glucose-Capillary: 135 mg/dL — ABNORMAL HIGH (ref 70–99)
Glucose-Capillary: 140 mg/dL — ABNORMAL HIGH (ref 70–99)
Glucose-Capillary: 198 mg/dL — ABNORMAL HIGH (ref 70–99)
Glucose-Capillary: 219 mg/dL — ABNORMAL HIGH (ref 70–99)

## 2022-11-30 LAB — BASIC METABOLIC PANEL
Anion gap: 8 (ref 5–15)
BUN: 14 mg/dL (ref 8–23)
CO2: 35 mmol/L — ABNORMAL HIGH (ref 22–32)
Calcium: 9 mg/dL (ref 8.9–10.3)
Chloride: 82 mmol/L — ABNORMAL LOW (ref 98–111)
Creatinine, Ser: 0.59 mg/dL (ref 0.44–1.00)
GFR, Estimated: >60 mL/min (ref >60)
Glucose, Bld: 162 mg/dL — ABNORMAL HIGH (ref 70–99)
Potassium: 4.3 mmol/L (ref 3.5–5.1)
Sodium: 125 mmol/L — ABNORMAL LOW (ref 135–145)

## 2022-11-30 LAB — URINE CULTURE

## 2022-11-30 MED ORDER — FUROSEMIDE 10 MG/ML IJ SOLN: 40.0000 mg | Freq: Every day | INTRAMUSCULAR | Status: DC

## 2022-11-30 MED ORDER — PREDNISONE 1 MG PO TABS
2.0000 mg | ORAL_TABLET | Freq: Every day | ORAL | Status: DC
  Administered 2022-11-30: 2 mg via ORAL
  Filled 2022-11-30 (×2): qty 2

## 2022-11-30 MED ORDER — FUROSEMIDE 10 MG/ML IJ SOLN
80.0000 mg | Freq: Two times a day (BID) | INTRAMUSCULAR | Status: DC
  Administered 2022-11-30: 80 mg via INTRAVENOUS
  Filled 2022-11-30: qty 8

## 2022-11-30 MED ORDER — INSULIN DETEMIR 100 UNIT/ML ~~LOC~~ SOLN
5.0000 [IU] | Freq: Every day | SUBCUTANEOUS | Status: DC
  Administered 2022-11-30: 5 [IU] via SUBCUTANEOUS
  Filled 2022-11-30 (×2): qty 0.05

## 2022-11-30 MED ORDER — PROCHLORPERAZINE EDISYLATE 10 MG/2ML IJ SOLN
5.0000 mg | INTRAMUSCULAR | Status: AC
  Administered 2022-11-30: 5 mg via INTRAVENOUS
  Filled 2022-11-30: qty 2

## 2022-11-30 MED ORDER — POLYVINYL ALCOHOL 1.4 % OP SOLN
1.0000 [drp] | OPHTHALMIC | Status: DC | PRN
  Filled 2022-11-30: qty 15

## 2022-11-30 MED ORDER — ENOXAPARIN SODIUM 80 MG/0.8ML IJ SOSY
80.0000 mg | PREFILLED_SYRINGE | Freq: Two times a day (BID) | INTRAMUSCULAR | Status: DC
  Administered 2022-11-30: 80 mg via SUBCUTANEOUS
  Filled 2022-11-30 (×2): qty 0.8

## 2022-11-30 MED ORDER — BUDESONIDE 0.5 MG/2ML IN SUSP
0.5000 mg | Freq: Every day | RESPIRATORY_TRACT | Status: DC
  Administered 2022-11-30: 0.5 mg via RESPIRATORY_TRACT
  Filled 2022-11-30 (×2): qty 2

## 2022-11-30 MED ORDER — CEFADROXIL 500 MG PO CAPS
1000.0000 mg | ORAL_CAPSULE | Freq: Two times a day (BID) | ORAL | Status: DC
  Administered 2022-11-30: 1000 mg via ORAL
  Filled 2022-11-30 (×2): qty 2

## 2022-11-30 MED ORDER — SODIUM CHLORIDE 0.9 % IV SOLN
3.0000 g | Freq: Three times a day (TID) | INTRAVENOUS | Status: DC
  Administered 2022-11-30 (×2): 3 g via INTRAVENOUS
  Filled 2022-11-30 (×3): qty 8

## 2022-11-30 MED ORDER — APIXABAN 5 MG PO TABS
5.0000 mg | ORAL_TABLET | Freq: Two times a day (BID) | ORAL | Status: DC
  Administered 2022-11-30: 5 mg via ORAL
  Filled 2022-11-30: qty 1

## 2022-11-30 MED ORDER — RISAQUAD PO CAPS: 1.0000 | ORAL_CAPSULE | Freq: Every day | ORAL | Status: DC

## 2022-11-30 MED ORDER — FUROSEMIDE 10 MG/ML IJ SOLN: 40.0000 mg | Freq: Two times a day (BID) | INTRAMUSCULAR | Status: DC

## 2022-11-30 MED ORDER — FLUCONAZOLE IN SODIUM CHLORIDE 200-0.9 MG/100ML-% IV SOLN
200.0000 mg | Freq: Every day | INTRAVENOUS | Status: DC
  Administered 2022-11-30: 200 mg via INTRAVENOUS
  Filled 2022-11-30 (×2): qty 100

## 2022-11-30 MED ORDER — FUROSEMIDE 10 MG/ML IJ SOLN: 80.0000 mg | Freq: Once | INTRAMUSCULAR | Status: DC

## 2022-11-30 MED ORDER — IPRATROPIUM-ALBUTEROL 0.5-2.5 (3) MG/3ML IN SOLN
3.0000 mL | Freq: Four times a day (QID) | RESPIRATORY_TRACT | Status: DC | PRN
  Administered 2022-11-30: 3 mL via RESPIRATORY_TRACT
  Filled 2022-11-30: qty 3

## 2022-11-30 NOTE — ED Notes (Signed)
Unable to obtain ABG, RT unable to obtain. Provider [place] notified.

## 2022-11-30 NOTE — ED Notes (Signed)
[name] [phone] home, [phone] cell. Call for anything

## 2022-11-30 NOTE — ED Notes (Signed)
Pt vomited after giving her her PO meds. Provider notified

## 2022-11-30 NOTE — Assessment & Plan Note (Deleted)
Patient will benefit from full comfort care.

## 2022-11-30 NOTE — ED Notes (Signed)
Pt is being continuously suctioned with yankauer. Daughter is at the bedside willing to provide suction.

## 2022-11-30 NOTE — Consult Note (Addendum)
Cardiology Consultation   Patient ID: [name] MRN: [id number]; DOB: 11/27/1920  Admit date: 11/20/2022 Date of Consult: 11/30/2022  PCP:  Heidi Laughter, MD   [facility] Providers Cardiologist:  None   {  Patient Profile:   Heidi Dominguez is a 89 y.o. female with a hx of chronic HFpEF, severe aortic stenosis, CKD, COPD , PE, pericardial effusion, hyperlipidemia, hypertension, type 2 diabetes mellitus, PVD s/p tibial stent, large hiatal hernia who is being seen 11/30/2022 for the evaluation of heart failure exacerbation at the request of Dr. Ella Jubilee.  History of Present Illness:   Heidi Dominguez has a longstanding history of chronic HFpEF that has appeared to be stable when evaluating echocardiograms dated almost back to 2013.  Her LVEF is generally around 60 to 65%. She is not currently followed by cardiology.  Within the past year patient has had numerous admissions for various complaints detailed more below.  Consistently she has had issues for hyponatremia (120s)  during multiple visits. Nephrology has been consulted for before in previous hospital admission and related it to acute on chronic hypoosmolar hyponatremia and hypervolemic status.  She had been placed on Lasix and tolvaptan. There was concern of solute intake.  Patients most recent admission was on November 17, 2022 due to acute on chronic respiratory failure with multifactorial etiologies.  This was in the setting of recent right shoulder prosthetic joint infection, COPD exacerbation, acute on chronic CHF exacerbation, small pericardial effusion, severe aortic stenosis, newly found pulmonary embolism.  Does not appear that patient was seen by cardiology during that admission or any admission prior.    Patient is now presenting to the emergency room on 11/21/2022 with complaints of dyspnea, hyponatremia, confusion, shortness of breath, vomiting, peripheral edema.  Overall seems that the patient is continues to decline  overall.  I went to see the patient at the bedside and patient was in acute respiratory failure, with severely distended JVD, peripheral edema, and was being aggressively suctioned.  She was extremely hypertensive with blood pressures reaching 200s systolic and hypercapnic on monitor. I spoke with the nurse about follow-up on stat chest x-ray that was already ordered, the attending was paged, and IV lasix  was administered. Per the nurse she was doing fine about an hour before but had vomited earlier in the day and since had rapidly declined. She was holding her oxygen saturations above 90 on 10L [place] however clinically was diaphoretic, clammy, had significant accessory muscle use, and not able to communicate verbally.normally.  Patient's daughter was at the bedside and all communication was through her.  Of note: Patient's daughter is extremely frustrated with the care that she has received so far stating that she feels that she has been prematurely discharged the underlying issue was never resolved.  Recently she was discharged home and patient was sent right back to the emergency room and found to have a pulmonary embolism.   ED workup: EKG normal sinus rhythm with a heart rate of 90, she has some T wave abnormalities and leads II, II.  Troponin slightly elevated at 21-18. Chest x-ray noting small right pleural effusion that is unchanged from prior exam.  He has got bilateral opacities suggestive of resolving pulmonary edema and pulmonary venous congestion.  BNP 428.  Sodium 125.  She has an elevated white count at 10.9, RBC 3.4, hemoglobin 9.1.  She has been started was started on IV Lasix 40 mg BID however has increased to  BID. She's had  progressive decline in respiratory function and attending team has been notified   Past Medical History:  Diagnosis Date   Acute pancreatitis    hx of    Arthritis    "qwhere" (07/04/2018)   Chronic back pain    "all over" (07/04/2018)   Chronic diastolic  CHF (congestive heart failure)    Chronic kidney disease    stage  3 chronic kidney disease    Complication of anesthesia    "I have a hard time waking up"   Degenerative joint disease of shoulder region    Dry eye syndrome    Dyspnea    Family history of adverse reaction to anesthesia    "daughter has the shakes when she wakes up"   GERD (gastroesophageal reflux disease)    Heart murmur    High cholesterol    History of hiatal hernia    HOH (hard of hearing)    Hypertension    Impacted cerumen of both ears    hx of    Muscle weakness (generalized)    Osteoarthritis    Overactive bladder    Pancreatitis    hx of    Phlebitis    "BLE"   Rotator cuff tear    right    Tear of right supraspinatus tendon    Thrombocytopenia    hx of    Type II diabetes mellitus    Urinary tract infection    hx of    UTI (urinary tract infection) 09/12/2018   Xerosis cutis    hx of     Past Surgical History:  Procedure Laterality Date   ABDOMINAL AORTOGRAM W/LOWER EXTREMITY N/A 12/22/2020   Procedure: ABDOMINAL AORTOGRAM W/LOWER EXTREMITY;  Surgeon: Leonie Douglas, MD;  Location: [facility];  Service: Cardiovascular;  Laterality: N/A;   ABDOMINAL HYSTERECTOMY     BACK SURGERY     CATARACT EXTRACTION W/ INTRAOCULAR LENS  IMPLANT, BILATERAL Bilateral    CHOLECYSTECTOMY  2016   ERCP N/A 08/30/2018   Procedure: ENDOSCOPIC RETROGRADE CHOLANGIOPANCREATOGRAPHY (ERCP);  Surgeon: Vida Rigger, MD;  Location: [facility];  Service: Endoscopy;  Laterality: N/A;   FIXATION KYPHOPLASTY     INCISION AND DRAINAGE Right 11/08/2018   Procedure: INCISION AND DRAINAGE right shoulder, placement of antibiotic beads ;  Surgeon: Beverely Low, MD;  Location: [facility];  Service: Orthopedics;  Laterality: Right;   IR US GUIDE BX ASP/DRAIN  11/04/2022   IR US GUIDE BX ASP/DRAIN  11/04/2022   JOINT REPLACEMENT     LAPAROSCOPIC CHOLECYSTECTOMY SINGLE SITE WITH INTRAOPERATIVE CHOLANGIOGRAM N/A 04/11/2015    Procedure: LAPAROSCOPIC LYSIS OF ADHESIONS, LAPAROSCOPIC CHOLECYSTECTOMY WITH INTRAOPERATIVE CHOLANGIOGRAM;  Surgeon: Karie Soda, MD;  Location: [facility];  Service: General;  Laterality: N/A;   PANCREATIC STENT PLACEMENT  08/30/2018   Procedure: PANCREATIC STENT PLACEMENT;  Surgeon: Vida Rigger, MD;  Location: [facility];  Service: Endoscopy;;   PERIPHERAL VASCULAR INTERVENTION  12/22/2020   Procedure: PERIPHERAL VASCULAR INTERVENTION;  Surgeon: Leonie Douglas, MD;  Location: [facility];  Service: Cardiovascular;;  left anterior tibial artery   REMOVAL OF STONES  08/30/2018   Procedure: REMOVAL OF STONES;  Surgeon: Vida Rigger, MD;  Location: [facility];  Service: Endoscopy;;   SHOULDER OPEN ROTATOR CUFF REPAIR Bilateral    SPHINCTEROTOMY  08/30/2018   Procedure: SPHINCTEROTOMY;  Surgeon: Vida Rigger, MD;  Location: [facility];  Service: Endoscopy;;   TOTAL KNEE ARTHROPLASTY Bilateral     Inpatient Medications: Scheduled Meds:  apixaban  5 mg Oral BID   budesonide  0.5 mg Nebulization Daily   cefadroxil  1,000 mg Oral BID   furosemide  40 mg Intravenous Q12H   Followed by   Melene Muller ON 12/01/2022] furosemide  40 mg Intravenous Daily   insulin aspart  0-15 Units Subcutaneous Q4H   insulin detemir  5 Units Subcutaneous Daily   predniSONE  2 mg Oral Q breakfast   Probiotic Acidophilus  1 capsule Oral QHS   sodium chloride flush  3 mL Intravenous Q12H   Continuous Infusions:  sodium chloride     fluconazole (DIFLUCAN) IV Stopped (11/30/22 0929)   PRN Meds: sodium chloride, acetaminophen, ondansetron (ZOFRAN) IV, polyvinyl alcohol, sodium chloride flush  Allergies:    Allergies  Allergen Reactions   Augmentin [Amoxicillin-Pot Clavulanate] Diarrhea   Bactrim [Sulfamethoxazole-Trimethoprim] Diarrhea and Itching   Clonidine Hcl Other (See Comments)    Unknown reaction  Not documented on MAR  Other Reaction(s): GI Intolerance, Other (See Comments)  Unknown., Unknown.,  Unknown.   Codeine Other (See Comments)    "Makes me out of my head, loopy"   Hydrocodone Other (See Comments)    "makes me go out of my head, loopy"   Other Other (See Comments)    Any type of narcotics Feels "loopy"    Sulfa Antibiotics Itching   Sulfamethoxazole Diarrhea and Itching    Not documented on MAR  Other Reaction(s): Diarrhea   Verapamil Diarrhea    Other Reaction(s): Diarrhea, GI Intolerance, Other (See Comments)   Zestril [Lisinopril] Cough   Azulfidine [Sulfasalazine] Itching    Social History:   Social History   Socioeconomic History   Marital status: Widowed    Spouse name: Not on file   Number of children: Not on file   Years of education: Not on file   Highest education level: Not on file  Occupational History   Occupation: retired  Tobacco Use   Smoking status: Never   Smokeless tobacco: Never  Vaping Use   Vaping Use: Never used  Substance and Sexual Activity   Alcohol use: No    Comment: rare   Drug use: No   Sexual activity: Not on file  Other Topics Concern   Not on file  Social History Narrative   Not on file   Social Determinants of Health   Financial Resource Strain: Not on file  Food Insecurity: No Food Insecurity (11/03/2022)   Hunger Vital Sign    Worried About Running Out of Food in the Last Year: Never true    Ran Out of Food in the Last Year: Never true  Transportation Needs: No Transportation Needs (11/03/2022)   PRAPARE - Administrator, Civil Service (Medical): No    Lack of Transportation (Non-Medical): No  Physical Activity: Not on file  Stress: Not on file  Social Connections: Not on file  Intimate Partner Violence: Not At Risk (11/03/2022)   Humiliation, Afraid, Rape, and Kick questionnaire    Fear of Current or Ex-Partner: No    Emotionally Abused: No    Physically Abused: No    Sexually Abused: No    Family History:    Family History  Problem Relation Age of Onset   Hypertension Mother     Diabetes Mellitus I Mother    Hypertension Father      ROS:  Please see the history of present illness.   All other ROS reviewed and negative.     Physical Exam/Data:   Vitals:  11/30/22 0429 11/30/22 0430 11/30/22 0700 11/30/22 0823  BP:  (!) 109/54 119/61 132/71  Pulse:  67 77 81  Resp:  (!) 22 (!) 24 (!) 23  Temp: 98.1 F (36.7 C)   97.7 F (36.5 C)  TempSrc: Oral   Axillary  SpO2:  94% 97% 99%  Weight:      Height:       No intake or output data in the 24 hours ending 11/30/22 1217    11/30/2022    2:05 AM 11/22/2022    5:00 AM 11/21/2022    4:32 AM  Last 3 Weights  Weight (lbs) 185 lb 3 oz 188 lb 4.4 oz 186 lb 15.2 oz  Weight (kg) 84 kg 85.4 kg 84.8 kg     Body mass index is 31.79 kg/m.  General: Acute respiratory failure HEENT: normal Neck: no JVD Vascular: No carotid bruits; Distal pulses 2+ bilaterally Cardiac: Grade 3 systolic murmur Lungs: Crackles throughout, tachypneic, excessive accessory muscle use Abd: soft, nontender, no hepatomegaly  Ext: no edema Musculoskeletal: Global 1+ edema  Skin: Cool, clammy, diaphoretic Neuro:  CNs 2-12 intact, no focal abnormalities noted Psych:  Normal affect   EKG:  The EKG was personally reviewed and demonstrates:  normal sinus rhythm with a heart rate of 90, she has some T wave abnormalities and leads II, II. Telemetry:  Telemetry was personally reviewed and demonstrates: Normal sinus rhythm heart rate 75  Relevant CV Studies: Limited echocardiogram 11/19/2022 Left Ventricle: Left ventricular ejection fraction, by estimation, is 60  to 65%. The left ventricle has normal function. The left ventricle has no  regional wall motion abnormalities. The left ventricular internal cavity  size was normal in size. There is   moderate concentric left ventricular hypertrophy.   Right Ventricle: The right ventricular size is mildly enlarged. Right  ventricular systolic function is mildly reduced.   Left Atrium: Left atrial  size was severely dilated.   Right Atrium: Right atrial size was normal in size.   Pericardium: A small pericardial effusion is present.   Mitral Valve: Severe mitral annular calcification. Mild mitral valve  regurgitation.   Tricuspid Valve: The tricuspid valve is normal in structure. Tricuspid  valve regurgitation is mild.   IAS/Shunts: No atrial level shunt detected by color flow Doppler.   Additional Comments: Limted study for pericardial effusion; full doppler  not performed; pericardial effusion remains small and unchanged compared  to 11/09/22. Spectral Doppler performed. Color Doppler performed.   Complete echocardiogram 11/01/2022  Left Ventricle: Left ventricular ejection fraction, by estimation, is 65  to 70%. The left ventricle has normal function. The left ventricle has no  regional wall motion abnormalities. Definity contrast agent was given IV  to delineate the left ventricular   endocardial borders. The left ventricular internal cavity size was normal  in size. There is mild left ventricular hypertrophy. Left ventricular  diastolic parameters are consistent with Grade I diastolic dysfunction  (impaired relaxation). Elevated left  atrial pressure.   Right Ventricle: The right ventricular size is normal. Right vetricular  wall thickness was not well visualized. Right ventricular systolic  function is normal. There is mildly elevated pulmonary artery systolic  pressure. The tricuspid regurgitant velocity   is 3.00 m/s, and with an assumed right atrial pressure of 8 mmHg, the  estimated right ventricular systolic pressure is 44.0 mmHg.   Left Atrium: Left atrial size was severely dilated.   Right Atrium: Right atrial size was normal in size.   Pericardium:  A small pericardial effusion is present. The pericardial  effusion is circumferential. There is no evidence of cardiac tamponade.   Mitral Valve: The mitral valve is abnormal. Trivial mitral valve   regurgitation. Moderate mitral valve stenosis. MV peak gradient, 16.9  mmHg. The mean mitral valve gradient is 6.0 mmHg.   Tricuspid Valve: The tricuspid valve is abnormal. Tricuspid valve  regurgitation is mild . No evidence of tricuspid stenosis.   Aortic Valve: The aortic valve is tricuspid. There is moderate  calcification of the aortic valve. There is moderate thickening of the  aortic valve. There is moderate aortic valve annular calcification. Aortic  valve regurgitation is not visualized. Severe   aortic stenosis is present. Aortic valve mean gradient measures 64.0  mmHg. Aortic valve peak gradient measures 91.6 mmHg. Aortic valve area, by  VTI measures 0.94 cm.   Pulmonic Valve: The pulmonic valve was abnormal. Pulmonic valve  regurgitation is mild. No evidence of pulmonic stenosis.   Aorta: The aortic root and ascending aorta are structurally normal, with  no evidence of dilitation.   Venous: The inferior vena cava is dilated in size with greater than 50%  respiratory variability, suggesting right atrial pressure of 8 mmHg.   IAS/Shunts: No atrial level shunt detected by color flow Doppler.   Laboratory Data:  High Sensitivity Troponin:   Recent Labs  Lab 11/12/22 0354 11/17/22 2027 11/18/22 0209 12/12/2022 1533 11/23/2022 1821  TROPONINIHS 9 17 16  21* 18*     Chemistry Recent Labs  Lab 12/02/2022 1638 12/04/2022 2214 11/30/22 0421  NA 121* 123* 125*  K 5.0 3.7 4.3  CL 81* 80* 82*  CO2  --  34* 35*  GLUCOSE 291* 240* 162*  BUN 15 13 14   CREATININE 0.50 0.59 0.59  CALCIUM  --  9.2 9.0  GFRNONAA  --  >60 >60  ANIONGAP  --  9 8    Recent Labs  Lab 11/19/2022 2214  PROT 6.9  ALBUMIN 2.2*  AST 15  ALT 13  ALKPHOS 78  BILITOT 0.5   Lipids No results for input(s): "CHOL", "TRIG", "HDL", "LABVLDL", "LDLCALC", "CHOLHDL" in the last 168 hours.  Hematology Recent Labs  Lab 12/04/2022 1533 11/26/2022 1638 11/30/22 0421  WBC 11.4*  --  10.9*  RBC 3.65*  --   3.44*  HGB 10.0* 12.2 9.1*  HCT 31.7* 36.0 30.7*  MCV 86.8  --  89.2  MCH 27.4  --  26.5  MCHC 31.5  --  29.6*  RDW 16.4*  --  16.5*  PLT 351  --  348   Thyroid No results for input(s): "TSH", "FREET4" in the last 168 hours.  BNP Recent Labs  Lab 12/12/2022 1533  BNP 428.2*    DDimer No results for input(s): "DDIMER" in the last 168 hours.   Radiology/Studies:  DG Chest Portable 1 View  Result Date: 11/22/2022 CLINICAL DATA:  CHF EXAM: PORTABLE CHEST 1 VIEW COMPARISON:  CXR 11/17/22 FINDINGS: Cardiomegaly. Small right pleural effusion. Likely layering left-sided pleural effusion, unchanged from prior exam. There prominent bilateral opacities slightly improved from prior exam, likely suggestive of resolving pulmonary edema or pulmonary venous congestion. No new focal airspace opacity. Unchanged mediastinal contours. No radiographically apparent displaced rib fractures. Bilateral shoulder arthroplasties. IMPRESSION: 1. Small right pleural effusion. Likely layering left-sided pleural effusion, unchanged from prior exam. 2. Prominent bilateral opacities slightly improved from prior exam, likely suggestive of resolving pulmonary edema or pulmonary venous congestion. Electronically Signed   By: Elige Radon.D.  On: 12/08/2022 17:27   CT Head Wo Contrast  Result Date: 11/17/2022 CLINICAL DATA:  Altered mental status EXAM: CT HEAD WITHOUT CONTRAST TECHNIQUE: Contiguous axial images were obtained from the base of the skull through the vertex without intravenous contrast. RADIATION DOSE REDUCTION: This exam was performed according to the departmental dose-optimization program which includes automated exposure control, adjustment of the mA and/or kV according to patient size and/or use of iterative reconstruction technique. COMPARISON:  None Available.  11/18/2022 FINDINGS: Brain: No evidence of acute infarction, hemorrhage, mass, mass effect, or midline shift. No hydrocephalus or extra-axial fluid  collection. Periventricular white matter changes, likely the sequela of chronic small vessel ischemic disease. Remote lacunar infarct in the right pons Vascular: No hyperdense vessel. Atherosclerotic calcifications in the intracranial carotid and vertebral arteries. Skull: Negative for fracture or focal lesion. Sinuses/Orbits: No acute finding. Status post bilateral lens replacements. Other: The mastoid air cells are well aerated. IMPRESSION: No acute intracranial process. Electronically Signed   By: Wiliam Ke M.D.   On: 11/13/2022 17:01     Assessment and Plan:   Acute on chronic HFpEF  Acute respiratory failure with multiple contributing comorbid conditions Patient was evaluated at the bedside and to be found in acute respiratory failure with severe JVD, excessive accessory muscle use, tachypnea, hypercapnic, and was aggressively being suctioned from the mouth.  A stat chest x-ray had been ordered, and the attending was paged.  80 mg of IV Lasix was given.  Stat chest x-ray was not completed during evaluation due to another emergent case.  BNP 428.  Sodium 125. Patient with obvious signs of fluid overload. Patient has very poor prognosis given resistant hyponatremia and multiple hospital admissions. As such we will plan to diurese her and maximize GDMT when appropriate.  Echocardiogram on 11/09/2022 noted EF of 65 to 70% grade 1 diastolic dysfunction, mildly elevated pulmonary arterial systolic pressure, severely dilated left atria, stable pericardial effusion severe aortic stenosis with a mean gradient of 64. Continue IV Lasix 80 mg twice daily Defer starting any antihypertensives until properly diuresed given variable BP measurements that have ranged from 110-206 systolic.   Hyponatremia  Has been evaluated by nephrology before and diagnosed with acute on chronic hypoosmolar hyponatremia and hypervolemia. Will continue to diurese as above but would consider consulting nephrology. Na  125.  Small stable pericardial effusion  Severe Aortic stenosis Unfortunately given age and comorbid risk factors she is not a candidate for surgery and is the underlying cause for her CHF. Aortic valve mean gradient 64.  Pulmonary embolism Currently on Eliquis 5 mg twice daily  Poor prognosis/goals of care Patient with multiple risk factors as noted above with poor prognosis. Patient's daughter is power of attorney. Currently DNR status, but daughter states she would want intubation depending on how long. I told daughter it is impossible to determine the duration if patient was intubated, but there was a high risk she would not come out the vent.  We had a long conversation discussing the details and concerns/benefits of intubation with Dr. Elease Hashimoto. Daughter has still not clearly voiced if she would/would not want patient to be intubated, but wants to pursue BiPap and continued therapies. Discussed with Dr. Ella Jubilee who will go speak with her once again to clarify because she had told him [facility] before our discussion with patient.   COPD CKD (has normal renal function right now) Manage per primary     Risk Assessment/Risk Scores:    New York Heart Association (NYHA)  Functional Class NYHA Class IV, may not be true representation at this time given acute respiratory failure     For questions or updates, please contact [facility] Please consult [email or web address] for contact info under    Signed, Abagail Kitchens, PA-C  11/30/2022 12:17 PM   Attending Note:   The patient was seen and examined.  Agree with assessment and plan as noted above.  Changes made to the above note as needed.  Patient seen and independently examined with Yvonna Alanis, PA .   We discussed all aspects of the encounter. I agree with the assessment and plan as stated above.   1.  End-stage severe aortic stenosis: The patient presents with sudden shortness of breath, mental status changes, apneic respirations.   She has had numerous hospitalizations over the past couple of months.  She has chronic episodes of hyponatremia.  The patient has not significantly responded to supplemental oxygen or Lasix.  She remains obtunded and has agonal breathing.  We had a long family meeting and discussion with the daughter and the rest of her family via cell phone conference.  The patient clearly has end-stage severe aortic stenosis.  Her mean aortic valve gradient is 64 mmHg.  I explained to the family that there was really no good medical therapy for aortic stenosis.  It is a surgical condition but the patient is not a candidate for any surgical procedures.  Through much discussion the family does agree that she should not be intubated.  I also do not want her to have CPR.  I have agreed that intubation may result in getting her "stuck" on the vent.  In addition, CPR during the cardiac arrest in a patient with such severe aortic stenosis would have very low likelihood of achieving ROSC.  I suggested the possibility of in hospital Hospice with comfort care.  The daughter said that she was not ready to go that route yet.  Her hope is that we can get her mothers condition improved at least to the point when she and the family can discuss her condition with her.    The family would like to try BiPAP, IV diuretics, and nebulizers. Her prognosis is very poor.    I have spent a total of 40 minutes with patient reviewing hospital  notes , telemetry, EKGs, labs and examining patient as well as establishing an assessment and plan that was discussed with the patient.  > 50% of time was spent in direct patient care.     Vesta Mixer, Montez Hageman., MD, [facility] 11/30/2022, 4:29 PM [address]  [address] Office [phone] Pager [phone]

## 2022-11-30 NOTE — Assessment & Plan Note (Addendum)
Multifactorial encephalopathy, poor prognosis, Today she is more reactive, but still very deconditioned and ill looking appearing.

## 2022-11-30 NOTE — Progress Notes (Signed)
Discussed with Respiratory Fayrene Fearing who is at bedisde and Dr Margo Aye. Patient requires heated high flow, failed [place], unable to main sats. Breathing treatment initiated . Respiratory rate 48, sats 80's . Family insist they do not want invasive treatment . Per Dr Margo Aye she will stop by to see patient . Escalated to high flow oxygen

## 2022-11-30 NOTE — ED Notes (Signed)
IV no longer intact. Site covered with a 2x2 and coban

## 2022-11-30 NOTE — Progress Notes (Signed)
ANTICOAGULATION CONSULT NOTE - Initial Consult  Pharmacy Consult for enoxaparin Indication: pulmonary embolus  Allergies  Allergen Reactions   Augmentin [Amoxicillin-Pot Clavulanate] Diarrhea   Bactrim [Sulfamethoxazole-Trimethoprim] Diarrhea and Itching   Clonidine Hcl Other (See Comments)    Unknown reaction  Not documented on MAR  Other Reaction(s): GI Intolerance, Other (See Comments)  Unknown., Unknown., Unknown.   Codeine Other (See Comments)    "Makes me out of my head, loopy"   Hydrocodone Other (See Comments)    "makes me go out of my head, loopy"   Other Other (See Comments)    Any type of narcotics Feels "loopy"    Sulfa Antibiotics Itching   Sulfamethoxazole Diarrhea and Itching    Not documented on MAR  Other Reaction(s): Diarrhea   Verapamil Diarrhea    Other Reaction(s): Diarrhea, GI Intolerance, Other (See Comments)   Zestril [Lisinopril] Cough   Azulfidine [Sulfasalazine] Itching    Patient Measurements: Height:  (162.6 cm) Weight: 84 kg (185 lb 3 oz) IBW/kg (Calculated) : 54.7 Heparin Dosing Weight: n/a  Vital Signs: Temp: 97.8 F (36.6 C) (04/18 1941) Temp Source: Axillary (04/18 1941) BP: 115/51 (04/18 1941) Pulse Rate: 87 (04/18 1941)  Labs: Recent Labs    11/29/22 1533 11/29/22 1638 11/29/22 1821 11/29/22 2214 11/30/22 0421  HGB 10.0* 12.2  --   --  9.1*  HCT 31.7* 36.0  --   --  30.7*  PLT 351  --   --   --  348  CREATININE  --  0.50  --  0.59 0.59  TROPONINIHS 21*  --  18*  --   --     Estimated Creatinine Clearance: 40.2 mL/min (by C-G formula based on SCr of 0.59 mg/dL).   Medical History: Past Medical History:  Diagnosis Date   Acute pancreatitis    hx of    Arthritis    "qwhere" (07/04/2018)   Chronic back pain    "all over" (07/04/2018)   Chronic diastolic CHF (congestive heart failure)    Chronic kidney disease    stage  3 chronic kidney disease    Complication of anesthesia    "I have a hard time  waking up"   Degenerative joint disease of shoulder region    Dry eye syndrome    Dyspnea    Family history of adverse reaction to anesthesia    "daughter has the shakes when she wakes up"   GERD (gastroesophageal reflux disease)    Heart murmur    High cholesterol    History of hiatal hernia    HOH (hard of hearing)    Hypertension    Impacted cerumen of both ears    hx of    Muscle weakness (generalized)    Osteoarthritis    Overactive bladder    Pancreatitis    hx of    Phlebitis    "BLE"   Rotator cuff tear    right    Tear of right supraspinatus tendon    Thrombocytopenia    hx of    Type II diabetes mellitus    Urinary tract infection    hx of    UTI (urinary tract infection) 09/12/2018   Xerosis cutis    hx of    Assessment: 89 yo F with h/o PE. She was started on Eliquis but now is too weak to take enteral medications.Pharmacy consulted to dose enoxaparin in the interim.   Last dose of Eliquis  was 4/18 .  CBC:  Hgb 9.1, Plt 348.   Goal of Therapy:  Monitor platelets by anticoagulation protocol: Yes   Plan:  Discontinue order for Eliquis Enoxaparin /kg (80 mg) SQ q12h  F/u renal function, s/sx bleeding, CBC Enoxaparin levels PRN  -f/u transition back to enteral therapies as able  Calton Dach, PharmD Clinical Pharmacist 11/30/2022 8:14 PM

## 2022-11-30 NOTE — Assessment & Plan Note (Addendum)
-  Body mass index is 30.46 kg/m. -feeding supplements per nutritional service once proven oral tolerance.

## 2022-11-30 NOTE — Assessment & Plan Note (Addendum)
Hyponatremia.   Will check renal function in am. This will help in further prognostic assessment.  Patient needs continue diuresis for volume overload.

## 2022-11-30 NOTE — Assessment & Plan Note (Addendum)
Systolic blood pressure 150s mmHg.

## 2022-11-30 NOTE — ED Notes (Signed)
Pt is confined to bed, does not ambulate

## 2022-11-30 NOTE — Progress Notes (Signed)
ABG readings reported to Dr Margo Aye:

## 2022-11-30 NOTE — ED Notes (Signed)
ED TO INPATIENT HANDOFF REPORT  ED Nurse Name and Phone #:  Marisue Ivan [phone]  S Name/Age/Gender Heidi Dominguez 89 y.o. female Room/Bed: 002C/002C  Code Status   Code Status: DNR  Home/SNF/Other Home Patient oriented to: self Is this baseline? Yes   Triage Complete: Triage complete  Chief Complaint Hyponatremia [E87.1] Heart failure [I50.9]  Triage Note Pt BIB EMS from [facility], for sodium of 124 per facility. Pt denies complaints, other than be "a little more tired than normal". On 2L [place] baseline, BIL LE edema. Daughter states pt has been seen for the same multiple times. Pt appears ShOB while speaking, speaking in short phrases. Daughter states that pt was increased to 3L at facility due to O2 sat being in the 70's. Pt currently on 2L [place].   EMS VS:  178/86 97% 2L [place] HR 90 CBG 284   Allergies Allergies  Allergen Reactions   Augmentin [Amoxicillin-Pot Clavulanate] Diarrhea   Bactrim [Sulfamethoxazole-Trimethoprim] Diarrhea and Itching   Clonidine Hcl Other (See Comments)    Unknown reaction  Not documented on MAR  Other Reaction(s): GI Intolerance, Other (See Comments)  Unknown., Unknown., Unknown.   Codeine Other (See Comments)    "Makes me out of my head, loopy"   Hydrocodone Other (See Comments)    "makes me go out of my head, loopy"   Other Other (See Comments)    Any type of narcotics Feels "loopy"    Sulfa Antibiotics Itching   Sulfamethoxazole Diarrhea and Itching    Not documented on MAR  Other Reaction(s): Diarrhea   Verapamil Diarrhea    Other Reaction(s): Diarrhea, GI Intolerance, Other (See Comments)   Zestril [Lisinopril] Cough   Azulfidine [Sulfasalazine] Itching    Level of Care/Admitting Diagnosis ED Disposition     ED Disposition  Admit   Condition  --   Comment  Hospital Area: [facility] [[id number]]  Level of Care: Telemetry Cardiac [[id number]]  May admit patient to [facility] or [facility] if equivalent level of  care is available:: No  Covid Evaluation: Asymptomatic - no recent exposure (last 10 days) testing not required  Diagnosis: Heart failure [454098]  Admitting Physician: Coralie Keens [[id number]]  Attending Physician: Coralie Keens [[id number]]  Certification:: I certify this patient will need inpatient services for at least 2 midnights          B Medical/Surgery History Past Medical History:  Diagnosis Date   Acute pancreatitis    hx of    Arthritis    "qwhere" (07/04/2018)   Chronic back pain    "all over" (07/04/2018)   Chronic diastolic CHF (congestive heart failure)    Chronic kidney disease    stage  3 chronic kidney disease    Complication of anesthesia    "I have a hard time waking up"   Degenerative joint disease of shoulder region    Dry eye syndrome    Dyspnea    Family history of adverse reaction to anesthesia    "daughter has the shakes when she wakes up"   GERD (gastroesophageal reflux disease)    Heart murmur    High cholesterol    History of hiatal hernia    HOH (hard of hearing)    Hypertension    Impacted cerumen of both ears    hx of    Muscle weakness (generalized)    Osteoarthritis    Overactive bladder    Pancreatitis    hx of  Phlebitis    "BLE"   Rotator cuff tear    right    Tear of right supraspinatus tendon    Thrombocytopenia    hx of    Type II diabetes mellitus    Urinary tract infection    hx of    UTI (urinary tract infection) 09/12/2018   Xerosis cutis    hx of    Past Surgical History:  Procedure Laterality Date   ABDOMINAL AORTOGRAM W/LOWER EXTREMITY N/A 12/22/2020   Procedure: ABDOMINAL AORTOGRAM W/LOWER EXTREMITY;  Surgeon: Leonie Douglas, MD;  Location: [facility];  Service: Cardiovascular;  Laterality: N/A;   ABDOMINAL HYSTERECTOMY     BACK SURGERY     CATARACT EXTRACTION W/ INTRAOCULAR LENS  IMPLANT, BILATERAL Bilateral    CHOLECYSTECTOMY  2016   ERCP N/A 08/30/2018   Procedure:  ENDOSCOPIC RETROGRADE CHOLANGIOPANCREATOGRAPHY (ERCP);  Surgeon: Vida Rigger, MD;  Location: [facility];  Service: Endoscopy;  Laterality: N/A;   FIXATION KYPHOPLASTY     INCISION AND DRAINAGE Right 11/08/2018   Procedure: INCISION AND DRAINAGE right shoulder, placement of antibiotic beads ;  Surgeon: Beverely Low, MD;  Location: [facility];  Service: Orthopedics;  Laterality: Right;   IR US GUIDE BX ASP/DRAIN  11/04/2022   IR US GUIDE BX ASP/DRAIN  11/04/2022   JOINT REPLACEMENT     LAPAROSCOPIC CHOLECYSTECTOMY SINGLE SITE WITH INTRAOPERATIVE CHOLANGIOGRAM N/A 04/11/2015   Procedure: LAPAROSCOPIC LYSIS OF ADHESIONS, LAPAROSCOPIC CHOLECYSTECTOMY WITH INTRAOPERATIVE CHOLANGIOGRAM;  Surgeon: Karie Soda, MD;  Location: [facility];  Service: General;  Laterality: N/A;   PANCREATIC STENT PLACEMENT  08/30/2018   Procedure: PANCREATIC STENT PLACEMENT;  Surgeon: Vida Rigger, MD;  Location: [facility];  Service: Endoscopy;;   PERIPHERAL VASCULAR INTERVENTION  12/22/2020   Procedure: PERIPHERAL VASCULAR INTERVENTION;  Surgeon: Leonie Douglas, MD;  Location: [facility];  Service: Cardiovascular;;  left anterior tibial artery   REMOVAL OF STONES  08/30/2018   Procedure: REMOVAL OF STONES;  Surgeon: Vida Rigger, MD;  Location: [facility];  Service: Endoscopy;;   SHOULDER OPEN ROTATOR CUFF REPAIR Bilateral    SPHINCTEROTOMY  08/30/2018   Procedure: SPHINCTEROTOMY;  Surgeon: Vida Rigger, MD;  Location: [facility];  Service: Endoscopy;;   TOTAL KNEE ARTHROPLASTY Bilateral      A IV Location/Drains/Wounds Patient Lines/Drains/Airways Status     Active Line/Drains/Airways     Name Placement date Placement time Site Days   Peripheral IV 11/16/2022 20 G Anterior;Distal;Left Forearm 11/30/2022  1549  Forearm  1   External Urinary Catheter 11/03/22  1332  --  27   Pressure Injury 11/22/22 Right Stage 2 -  Partial thickness loss of dermis presenting as a shallow open injury with a red, pink wound bed without  slough. 11/22/22  1133  -- 8   Pressure Injury 11/22/22 Sacrum Stage 2 -  Partial thickness loss of dermis presenting as a shallow open injury with a red, pink wound bed without slough. 11/22/22  1135  -- 8   Pressure Injury 11/22/22 Buttocks Right Stage 2 -  Partial thickness loss of dermis presenting as a shallow open injury with a red, pink wound bed without slough. 11/22/22  1135  -- 8            Intake/Output Last 24 hours No intake or output data in the 24 hours ending 11/30/22 1152  Labs/Imaging Results for orders placed or performed during the hospital encounter of 12/10/2022 (from the past 48 hour(s))  Troponin I (  High Sensitivity)     Status: Abnormal   Collection Time: 12/12/2022  3:33 PM  Result Value Ref Range   Troponin I (High Sensitivity) 21 (H) <18 ng/L    Comment: (NOTE) Elevated high sensitivity troponin I (hsTnI) values and significant  changes across serial measurements may suggest ACS but many other  chronic and acute conditions are known to elevate hsTnI results.  Refer to the "Links" section for chest pain algorithms and additional  guidance. Performed at [facility], [address] [place], [place] [address]   Brain natriuretic peptide     Status: Abnormal   Collection Time: 12/12/2022  3:33 PM  Result Value Ref Range   B Natriuretic Peptide 428.2 (H) 0.0 - 100.0 pg/mL    Comment: Performed at [facility], [address], [place], [place] [address]  CBC with Differential     Status: Abnormal   Collection Time: 11/13/2022  3:33 PM  Result Value Ref Range   WBC 11.4 (H) 4.0 - 10.5 K/uL   RBC 3.65 (L) 3.87 - 5.11 MIL/uL   Hemoglobin 10.0 (L) 12.0 - 15.0 g/dL   HCT 09.8 (L) 11.9 - 14.7 %   MCV 86.8 80.0 - 100.0 fL   MCH 27.4 26.0 - 34.0 pg   MCHC 31.5 30.0 - 36.0 g/dL   RDW 82.9 (H) 56.2 - 13.0 %   Platelets 351 150 - 400 K/uL   nRBC 0.0 0.0 - 0.2 %   Neutrophils Relative % 83 %   Neutro Abs 9.4 (H) 1.7 - 7.7 K/uL   Lymphocytes Relative 7 %    Lymphs Abs 0.8 0.7 - 4.0 K/uL   Monocytes Relative 9 %   Monocytes Absolute 1.0 0.1 - 1.0 K/uL   Eosinophils Relative 0 %   Eosinophils Absolute 0.0 0.0 - 0.5 K/uL   Basophils Relative 0 %   Basophils Absolute 0.0 0.0 - 0.1 K/uL   Immature Granulocytes 1 %   Abs Immature Granulocytes 0.10 (H) 0.00 - 0.07 K/uL    Comment: Performed at [facility], [address] [place], [place] [address]  I-stat chem 8, ED     Status: Abnormal   Collection Time: 11/19/2022  4:38 PM  Result Value Ref Range   Sodium 121 (L) 135 - 145 mmol/L   Potassium 5.0 3.5 - 5.1 mmol/L   Chloride 81 (L) 98 - 111 mmol/L   BUN 15 8 - 23 mg/dL   Creatinine, Ser 4.69 0.44 - 1.00 mg/dL   Glucose, Bld 629 (H) 70 - 99 mg/dL    Comment: Glucose reference range applies only to samples taken after fasting for at least 8 hours.   Calcium, Ion 1.21 1.15 - 1.40 mmol/L   TCO2 37 (H) 22 - 32 mmol/L   Hemoglobin 12.2 12.0 - 15.0 g/dL   HCT 52.8 41.3 - 24.4 %  Troponin I (High Sensitivity)     Status: Abnormal   Collection Time: 12/04/2022  6:21 PM  Result Value Ref Range   Troponin I (High Sensitivity) 18 (H) <18 ng/L    Comment: (NOTE) Elevated high sensitivity troponin I (hsTnI) values and significant  changes across serial measurements may suggest ACS but many other  chronic and acute conditions are known to elevate hsTnI results.  Refer to the "Links" section for chest pain algorithms and additional  guidance. Performed at [facility], [address], [place], [place] [address]   Urinalysis, w/ Reflex to Culture (  Infection Suspected) -Urine, Catheterized     Status: Abnormal   Collection Time: 11/15/2022 10:11 PM  Result Value Ref Range   Specimen Source URINE, CATHETERIZED    Color, Urine YELLOW YELLOW   APPearance HAZY (A) CLEAR   Specific Gravity, Urine 1.011 1.005 - 1.030   pH 6.0 5.0 - 8.0   Glucose, UA NEGATIVE NEGATIVE mg/dL   Hgb urine dipstick SMALL (A) NEGATIVE   Bilirubin Urine NEGATIVE  NEGATIVE   Ketones, ur NEGATIVE NEGATIVE mg/dL   Protein, ur 409 (A) NEGATIVE mg/dL   Nitrite NEGATIVE NEGATIVE   Leukocytes,Ua LARGE (A) NEGATIVE   RBC / HPF 0-5 0 - 5 RBC/hpf   WBC, UA 11-20 0 - 5 WBC/hpf    Comment:        Reflex urine culture not performed if WBC <=10, OR if Squamous epithelial cells >5. If Squamous epithelial cells >5 suggest recollection.    Bacteria, UA RARE (A) NONE SEEN   Squamous Epithelial / HPF 0-5 0 - 5 /HPF   Mucus PRESENT    Budding Yeast PRESENT     Comment: Performed at [facility], [address], [place], [place] [address]  Urine Culture     Status: None (Preliminary result)   Collection Time: 11/28/2022 10:11 PM   Specimen: Urine, Catheterized  Result Value Ref Range   Specimen Description URINE, CATHETERIZED    Special Requests      NONE Reflexed from [id number] Performed at [facility], [address], [place], [place] [address]    Culture PENDING    Report Status PENDING   Comprehensive metabolic panel     Status: Abnormal   Collection Time: 11/20/2022 10:14 PM  Result Value Ref Range   Sodium 123 (L) 135 - 145 mmol/L   Potassium 3.7 3.5 - 5.1 mmol/L   Chloride 80 (L) 98 - 111 mmol/L   CO2 34 (H) 22 - 32 mmol/L   Glucose, Bld 240 (H) 70 - 99 mg/dL    Comment: Glucose reference range applies only to samples taken after fasting for at least 8 hours.   BUN 13 8 - 23 mg/dL   Creatinine, Ser 8.65 0.44 - 1.00 mg/dL   Calcium 9.2 8.9 - 78.4 mg/dL   Total Protein 6.9 6.5 - 8.1 g/dL   Albumin 2.2 (L) 3.5 - 5.0 g/dL   AST 15 15 - 41 U/L   ALT 13 0 - 44 U/L   Alkaline Phosphatase 78 38 - 126 U/L   Total Bilirubin 0.5 0.3 - 1.2 mg/dL   GFR, Estimated >69 >62 mL/min    Comment: (NOTE) Calculated using the CKD-EPI Creatinine Equation (2021)    Anion gap 9 5 - 15    Comment: Performed at [facility], [address], [place], [place] [address]  CBG monitoring, ED     Status: Abnormal   Collection Time: 11/30/22 12:41 AM   Result Value Ref Range   Glucose-Capillary 219 (H) 70 - 99 mg/dL    Comment: Glucose reference range applies only to samples taken after fasting for at least 8 hours.  CBG monitoring, ED     Status: Abnormal   Collection Time: 11/30/22  3:07 AM  Result Value Ref Range   Glucose-Capillary 198 (H) 70 - 99 mg/dL    Comment: Glucose reference range applies only to samples taken after fasting for at least 8 hours.  CBC with Differential/Platelet     Status: Abnormal   Collection  Time: 11/30/22  4:21 AM  Result Value Ref Range   WBC 10.9 (H) 4.0 - 10.5 K/uL   RBC 3.44 (L) 3.87 - 5.11 MIL/uL   Hemoglobin 9.1 (L) 12.0 - 15.0 g/dL   HCT 40.9 (L) 81.1 - 91.4 %   MCV 89.2 80.0 - 100.0 fL   MCH 26.5 26.0 - 34.0 pg   MCHC 29.6 (L) 30.0 - 36.0 g/dL   RDW 78.2 (H) 95.6 - 21.3 %   Platelets 348 150 - 400 K/uL   nRBC 0.0 0.0 - 0.2 %   Neutrophils Relative % 82 %   Neutro Abs 8.9 (H) 1.7 - 7.7 K/uL   Lymphocytes Relative 6 %   Lymphs Abs 0.6 (L) 0.7 - 4.0 K/uL   Monocytes Relative 10 %   Monocytes Absolute 1.1 (H) 0.1 - 1.0 K/uL   Eosinophils Relative 1 %   Eosinophils Absolute 0.1 0.0 - 0.5 K/uL   Basophils Relative 0 %   Basophils Absolute 0.0 0.0 - 0.1 K/uL   Immature Granulocytes 1 %   Abs Immature Granulocytes 0.09 (H) 0.00 - 0.07 K/uL    Comment: Performed at [facility], [address], [place], [place] [address]  Basic metabolic panel     Status: Abnormal   Collection Time: 11/30/22  4:21 AM  Result Value Ref Range   Sodium 125 (L) 135 - 145 mmol/L   Potassium 4.3 3.5 - 5.1 mmol/L    Comment: HEMOLYSIS AT THIS LEVEL MAY AFFECT RESULT   Chloride 82 (L) 98 - 111 mmol/L   CO2 35 (H) 22 - 32 mmol/L   Glucose, Bld 162 (H) 70 - 99 mg/dL    Comment: Glucose reference range applies only to samples taken after fasting for at least 8 hours.   BUN 14 8 - 23 mg/dL   Creatinine, Ser 8.46 0.44 - 1.00 mg/dL   Calcium 9.0 8.9 - 96.2 mg/dL   GFR, Estimated >95 >28 mL/min    Comment:  (NOTE) Calculated using the CKD-EPI Creatinine Equation (2021)    Anion gap 8 5 - 15    Comment: Performed at [facility], [address], [place], [place] [address]  CBG monitoring, ED     Status: Abnormal   Collection Time: 11/30/22  7:40 AM  Result Value Ref Range   Glucose-Capillary 135 (H) 70 - 99 mg/dL    Comment: Glucose reference range applies only to samples taken after fasting for at least 8 hours.   DG Chest Portable 1 View  Result Date: 11/26/2022 CLINICAL DATA:  CHF EXAM: PORTABLE CHEST 1 VIEW COMPARISON:  CXR 11/17/22 FINDINGS: Cardiomegaly. Small right pleural effusion. Likely layering left-sided pleural effusion, unchanged from prior exam. There prominent bilateral opacities slightly improved from prior exam, likely suggestive of resolving pulmonary edema or pulmonary venous congestion. No new focal airspace opacity. Unchanged mediastinal contours. No radiographically apparent displaced rib fractures. Bilateral shoulder arthroplasties. IMPRESSION: 1. Small right pleural effusion. Likely layering left-sided pleural effusion, unchanged from prior exam. 2. Prominent bilateral opacities slightly improved from prior exam, likely suggestive of resolving pulmonary edema or pulmonary venous congestion. Electronically Signed   By: Lorenza Cambridge M.D.   On: 11/19/2022 17:27   CT Head Wo Contrast  Result Date: 11/16/2022 CLINICAL DATA:  Altered mental status EXAM: CT HEAD WITHOUT CONTRAST TECHNIQUE: Contiguous axial images were obtained from the base of the skull through the vertex without intravenous contrast. RADIATION DOSE REDUCTION: This exam was performed according to  the departmental dose-optimization program which includes automated exposure control, adjustment of the mA and/or kV according to patient size and/or use of iterative reconstruction technique. COMPARISON:  None Available.  11/18/2022 FINDINGS: Brain: No evidence of acute infarction, hemorrhage, mass, mass effect, or  midline shift. No hydrocephalus or extra-axial fluid collection. Periventricular white matter changes, likely the sequela of chronic small vessel ischemic disease. Remote lacunar infarct in the right pons Vascular: No hyperdense vessel. Atherosclerotic calcifications in the intracranial carotid and vertebral arteries. Skull: Negative for fracture or focal lesion. Sinuses/Orbits: No acute finding. Status post bilateral lens replacements. Other: The mastoid air cells are well aerated. IMPRESSION: No acute intracranial process. Electronically Signed   By: Wiliam Ke M.D.   On: 12/05/2022 17:01    Pending Labs Unresulted Labs (From admission, onward)     Start     Ordered   11/23/2022 2201  Urinalysis, Routine w reflex microscopic -Urine, Catheterized  Once,   R       Question:  Specimen Source  Answer:  Urine, Catheterized   11/22/2022 2200   12/10/2022 2159  Blood gas, arterial  ONCE - STAT,   STAT        11/22/2022 2158   11/30/2022 2114  Comprehensive metabolic panel  Once,   STAT        12/11/2022 2113            Vitals/Pain Today's Vitals   11/30/22 0429 11/30/22 0430 11/30/22 0700 11/30/22 0823  BP:  (!) 109/54 119/61 132/71  Pulse:  67 77 81  Resp:  (!) 22 (!) 24 (!) 23  Temp: 98.1 F (36.7 C)   97.7 F (36.5 C)  TempSrc: Oral   Axillary  SpO2:  94% 97% 99%  Weight:      Height:      PainSc:        Isolation Precautions No active isolations  Medications Medications  insulin aspart (novoLOG) injection 0-15 Units (2 Units Subcutaneous Given 11/30/22 0815)  sodium chloride flush (NS) 0.9 % injection 3 mL (3 mLs Intravenous Given 11/30/22 0930)  sodium chloride flush (NS) 0.9 % injection 3 mL (has no administration in time range)  0.9 %  sodium chloride infusion (has no administration in time range)  acetaminophen (TYLENOL) tablet 650 mg (has no administration in time range)  ondansetron (ZOFRAN) injection 4 mg (has no administration in time range)  fluconazole (DIFLUCAN) IVPB 200  mg (0 mg Intravenous Stopped 11/30/22 0929)  apixaban (ELIQUIS) tablet 5 mg (5 mg Oral Given 11/30/22 1143)  Probiotic Acidophilus CAPS 1 capsule (has no administration in time range)  insulin detemir (LEVEMIR) injection 5 Units (5 Units Subcutaneous Given 11/30/22 1102)  polyvinyl alcohol (LIQUIFILM TEARS) 1.4 % ophthalmic solution 1 drop (has no administration in time range)  predniSONE (DELTASONE) tablet 2 mg (2 mg Oral Given 11/30/22 1143)  cefadroxil (DURICEF) capsule 1,000 mg (1,000 mg Oral Given 11/30/22 1144)  budesonide (PULMICORT) nebulizer solution 0.5 mg (0.5 mg Nebulization Given 11/30/22 1113)  furosemide (LASIX) injection 40 mg (has no administration in time range)    Followed by  furosemide (LASIX) injection 40 mg (has no administration in time range)  furosemide (LASIX) injection 40 mg (40 mg Intravenous Given 11/22/2022 1830)  albuterol (PROVENTIL) (2.5 MG/3ML) 0.083% nebulizer solution 2.5 mg (2.5 mg Nebulization Given 11/30/2022 1940)    Mobility non-ambulatory     Focused Assessments Cardiac Assessment Handoff:  Cardiac Rhythm: Normal sinus rhythm Lab Results  Component Value Date  CKTOTAL 24 (L) 02/09/2021   CKMB 1.5 02/27/2012   TROPONINI 0.03 (HH) 08/27/2018   Lab Results  Component Value Date   DDIMER 3.39 (H) 03/16/2021   Does the Patient currently have chest pain? No    R Recommendations: See Admitting Provider Note  Report given to:   Additional Notes:  Small PO meds given with small sips of water, larger crushed and given with apple sauce. Okay to give PO meds despite NPO per Provider

## 2022-11-30 NOTE — Assessment & Plan Note (Addendum)
Echocardiogram with preserved LV systolic function EF 60 to 65%, moderate LVH, mild RV enlargement, RV systolic function with mild reduction, LA with severe dilatation, RA normal size, small pericardial effusion.  Severe aortic stenosis.   Patient continue with sever volume overload.   Patient with shock physiology, cardiogenic shock. Family would like to avoid aggressive interventions in the setting of poor prognosis.   Plan to continue diuresis with furosemide 80 mg IV q12 hrs.  Acute hypoxemic and hypercapnic respiratory failure due to cardiogenic pulmonary edema, possible pneumonia at the left lower lobe. Discontinue antibiotic therapy.   Continue Bipap for now and IV diuresis. Antibiotics with IV unasyn, Possible transition to comfort care, she will benefit from continuous infusion of morphine for dyspnea and increased work of breathing.   Patient with poor prognosis and high risk of worsening, her daughter confirms DNR and do not intubate.

## 2022-11-30 NOTE — Assessment & Plan Note (Addendum)
Discontinue enoxaparin, patient with poor prognosis, will avoid interventions that may produce pain and discomfort.

## 2022-11-30 NOTE — Progress Notes (Signed)
Pharmacy Antibiotic Note  Heidi Dominguez is a 89 y.o. female admitted on 11/29/2022 with pneumonia.  Pharmacy has been consulted for Unasyn dosing. WBC 10.9, afeb   Plan: Unasyn 3g IV q8h  -f/u renal function, LOT   Height:  (162.6 cm) Weight: 84 kg (185 lb 3 oz) IBW/kg (Calculated) : 54.7  Temp (24hrs), Avg:97.9 F (36.6 C), Min:97.7 F (36.5 C), Max:98.1 F (36.7 C)  Recent Labs  Lab 11/29/22 1533 11/29/22 1638 11/29/22 2214 11/30/22 0421  WBC 11.4*  --   --  10.9*  CREATININE  --  0.50 0.59 0.59    Estimated Creatinine Clearance: 40.2 mL/min (by C-G formula based on SCr of 0.59 mg/dL).    Allergies  Allergen Reactions   Augmentin [Amoxicillin-Pot Clavulanate] Diarrhea   Bactrim [Sulfamethoxazole-Trimethoprim] Diarrhea and Itching   Clonidine Hcl Other (See Comments)    Unknown reaction  Not documented on MAR  Other Reaction(s): GI Intolerance, Other (See Comments)  Unknown., Unknown., Unknown.   Codeine Other (See Comments)    "Makes me out of my head, loopy"   Hydrocodone Other (See Comments)    "makes me go out of my head, loopy"   Other Other (See Comments)    Any type of narcotics Feels "loopy"    Sulfa Antibiotics Itching   Sulfamethoxazole Diarrhea and Itching    Not documented on MAR  Other Reaction(s): Diarrhea   Verapamil Diarrhea    Other Reaction(s): Diarrhea, GI Intolerance, Other (See Comments)   Zestril [Lisinopril] Cough   Azulfidine [Sulfasalazine] Itching    Antimicrobials this admission: Cefadroxil 4/18 x1  Unasyn 4/18>   Dose adjustments this admission:   Microbiology results: 4/17 Ucx: pending  4/6 RVP: negative   Thank you for allowing pharmacy to be a part of this patient's care.  Calton Dach, PharmD Clinical Pharmacist 11/30/2022 3:40 PM

## 2022-11-30 NOTE — ED Notes (Signed)
Pt was given some water to drink and she was able to drink without incident.

## 2022-11-30 NOTE — Hospital Course (Addendum)
Heidi Dominguez was admitted to the hospital with the working diagnosis of heart failure exacerbation.   89 yo female with the past medical history of severe aortic stenosis, hyponatremia, T2DM, pulmonary embolism, asthma, large hiatal hernia, and obesity who presented with altered mentation. Most of the information from her daughter at the bedside, patient has been weak, and lethargic, with nausea, and vomiting that prompted her to bring her mother to the ED.  This is her 3rd hospitalization this year and 4th visit to the ED. Last hospitalization from 11/17/22 to 11/22/23, due to volume overload, and pulmonary embolism (acute small branch posterior right lobe). On her initial physical examination her blood pressure was 160/72, HR 88, RR 21 to 34, and 02 saturation 98%, lungs with rhonchi and rales, heart with S1 and S2 present and rhythmic, positive systolic murmur at the base, severe JVD, abdomen with no distention and positive lower extremity edema.   Na 121, K 5,0 Cl 81, glucose 291, bun 15 cr 0,50   Wbc 11,4 hgb 10,0 plt 351 Urine analysis with SG 1,011, 100 protein, large leukocytes, 11-20.   Head CT with no acute changes.   Chest radiograph with cardiomegaly with bilateral hilar vascular congestion, bilateral interstitial infiltrates more confluent left lower lobe  Follow up chest radiograph with persistent vascular congestion with worsening infiltrate at the left lower lobe.  Had opacity left lower lobe in the past, she is known to have large hiatal h hernia. Chest CT personally reviewed.   EKG 89 bpm, normal axis, normal intervals, sinus rhythm with no significant ST segment or T wave changes.   Patient was placed on furosemide and Bipap  04/19 Patient over night had persistent respiratory failure and hypotension, shock physiology. Critical care was consulted and patient's daughter was updated.  Family decided to avoid ICU transfer and not perform aggressive invasive interventions, or  vasopressors.    This morning patient continue unresponsive and hypotensive, she has bipap in place.  I spoke with her daughter POA, and she understands that her mother is having multiorgan failure with poor prognosis. She is aware that death is imminent despite the current aggressive non invasive therapies.  She is requesting no more blood work. We talk about changing to full comfort care today. She will call back on final decision about changing to full comfort care.   04/20 patient is more reactive today, but continue very weak and deconditioned. Her daughter has requested to continue IV antibiotics.   04/21: Verbally interacting with family; overall weak and deconditioned.  Seen by speech therapy with recommendation for full liquid diet.  Family in agreement to advance diet with good understanding regarding risk for aspiration.  Still with signs of fluid below on exam. 04/22 patient very weak and deconditioned, opens her eyes to light touch, not interactive.   04/23 patient with decreased 02 requirements, today down to 8 L.min per HFNC.   I spoke with her daughter Heidi Dominguez) and her son at the bedside.  Plan is to continue medical therapy during her hospitalization and discharge to home with home hospice as plan A, if not possible due to more higher level of care plan B is residential hospice.

## 2022-11-30 NOTE — IPAL (Signed)
I have talked to patient's daughter at the bedside, (POA), her other family members were on speaker phone on her cell phone. I have explained that patient has severe aortic stenosis with decompensated heart failure. Positive respiratory distress and high risk for worsening.  She would like to do everything possible to help her mother, but at the same time she want's her mom not to suffer.  She confirms DNR and do not intubate.  Plan trail of non invasive mechanical ventilation and diuresis.

## 2022-11-30 NOTE — ED Notes (Signed)
Pt is resting in bed with eyes closed. Pt coughs frequently and mumbles to herself. Pt is attached to the monitor/vitals/O2. Both side rails are up, call light is within reach if needed. Pt is covered with a blanket, PW in place and working.

## 2022-11-30 NOTE — Progress Notes (Signed)
MD ordered BiPAP for pt. RT at bedside to assess pt. Pt was labored, per family pt is nonverbal and pt has vomited. RT spoke to MD about contraindications. Per MD verbally, hold off on BiPAP at this time.

## 2022-11-30 NOTE — ED Notes (Signed)
Report given to RN. Advised that pt no longer has an IV. I asked if he was okay to receive her without one and he stated yes.

## 2022-11-30 NOTE — Progress Notes (Addendum)
Progress Note   Heidi Dominguez: Heidi Dominguez ZOX:[id number] DOB: [birth year] DOA: 11/29/2022     1 DOS: the Heidi Dominguez was seen and examined on 11/30/2022   Brief hospital course: Heidi Dominguez was admitted to the hospital with the working diagnosis of heart failure exacerbation.   89 yo female with the past medical history of severe aortic stenosis, hyponatremia, T2DM, pulmonary embolism, asthma, large hiatal hernia, and obesity who presented with altered mentation. Most of the information from Heidi daughter at the bedside, Heidi Dominguez has been weak, and lethargic, with nausea, and vomiting that prompted Heidi to bring Heidi Dominguez to the ED.  This is Heidi 3rd hospitalization this year and 4th visit to the ED. Last hospitalization from 11/17/22 to 11/22/23, due to volume overload, and pulmonary embolism (acute small branch posterior right lobe). On Heidi initial physical examination Heidi blood pressure was 160/72, HR 88, RR 21 to 34, and 02 saturation 98%, lungs with rhonchi and rales, heart with S1 and S2 present and rhythmic, positive systolic murmur at the base, severe JVD, abdomen with no distention and positive lower extremity edema.   Na 121, K 5,0 Cl 81, glucose 291, bun 15 cr 0,50   Wbc 11,4 hgb 10,0 plt 351 Urine analysis with SG 1,011, 100 protein, large leukocytes, 11-20.   Head CT with no acute changes.   Chest radiograph with cardiomegaly with bilateral hilar vascular congestion, bilateral interstitial infiltrates more confluent left lower lobe  Follow up chest radiograph with persistent vascular congestion with worsening infiltrate at the left lower lobe.  Had opacity left lower lobe in the past, she is known to have large hiatal h hernia. Chest CT personally reviewed.   EKG 89 bpm, normal axis, normal intervals, sinus rhythm with no significant ST segment or T wave changes.      Assessment and Plan: Acute on chronic diastolic CHF (congestive heart failure) Echocardiogram with preserved LV  systolic function EF 60 to 65%, moderate LVH, mild RV enlargement, RV systolic function with mild reduction, LA with severe dilatation, RA normal size, small pericardial effusion.  Severe aortic stenosis.   Heidi Dominguez continue with sever volume overload.   Plan to continue diuresis with furosemide 80 mg IV q12 hrs.  Acute hypoxemic respiratory failure due to cardiogenic pulmonary edema, possible pneumonia at the left lower lobe. Heidi Dominguez in respiratory distress, but not able to use Bipap due to nausea and vomiting.  Will continue supportive medical therapy with supplemental 02 per nasal cannula and add IV antibiotic therapy. Aspiration precautions.  Heidi Dominguez with poor prognosis and high risk of worsening, Heidi daughter confirms DNR and do not intubate.    CKD (chronic kidney disease) stage 3, GFR 30-59 ml/min Hyponatremia.   Heidi Dominguez with volume overload.  Renal function with serum cr at -0,50 Possible component of SIADH, with urinary SG at 1,011.  Plan to continue diuresis for now with IV furosemide.  Unfortunately Heidi Dominguez not able to take po nutrition at this point due to nausea and vomiting, Plan to check renal function and electrolytes in am.   Essential hypertension Continue blood pressure monitoring.  Add IV hydralazine as needed, Heidi Dominguez not able to take po meds.   Pulmonary embolism Will place Heidi Dominguez on therapeutic enoxaparin for anticoagulation.   Right shoulder prosthetic joint infection Continue pain control with as needed analgesics.   Diabetes mellitus type 2, controlled Sliding scale for glucose cover and monitoring.   COPD (chronic obstructive pulmonary disease) Continue supplemental 02 per [place] Bronchodilator therapy Aspiration precautions.  No  clinical signs of exacerbation,   Acute metabolic encephalopathy Multifactorial encephalopathy, poor prognosis, Recurrent hospitalization,  Will consult palliative care   Class 1 obesity Calculated BMI is 31,7          Subjective: Heidi Dominguez in distress, she is not following commands or answering questions, Heidi daughter is at the bedside   Physical Exam: Vitals:   11/30/22 1245 11/30/22 1249 11/30/22 1400 11/30/22 1415  BP: (!) 206/80  (!) 159/69   Pulse: 96   88  Resp: (!) 28   (!) 33  Temp:  97.8 F (36.6 C)    TempSrc:  Axillary    SpO2: 96%   97%  Weight:      Height:       Neurology awake, not interactive, ill looking appearing and deconditioned. ENT with mild pallor Cardiovascular with S1 and S2 present and rhythmic, positive systolic murmur at the base.  Positive severe JVD Positive lower extremity edema Respiratory with rales and rhonchi, with no wheezing, on anterior auscultation, positive use of accessory muscles.  Abdomen with no distention  Data Reviewed:    Family Communication: I spoke with Heidi Dominguez's daughter at the bedside, we talked in detail about Heidi Dominguez's condition, plan of care and prognosis and all questions were addressed.   Disposition: Status is: Inpatient Remains inpatient appropriate because: IV diuresis  Planned Discharge Destination:  to be determined       Author: Coralie Keens, MD 11/30/2022 3:04 PM  For on call review [email or web address].

## 2022-11-30 NOTE — Assessment & Plan Note (Addendum)
Supportive bipap for now.

## 2022-11-30 NOTE — ED Notes (Signed)
PW suction canister emptied and changed out with a new one. 700 cc urine.

## 2022-11-30 NOTE — ED Notes (Signed)
Pt has become very labored and tachypneic since vomiting. Pt is also very diaphoretic. Provider notified.

## 2022-11-30 NOTE — Assessment & Plan Note (Addendum)
Stop insulin therapy. No more blood work, per her family request.

## 2022-11-30 NOTE — Progress Notes (Signed)
Heart Failure Navigator Progress Note  Assessed for Heart & Vascular TOC clinic readiness.  Patient EF 60-65%, No HF TOC per Dr. Ella Jubilee. .   Navigator will sign off at this time.   Rhae Hammock, BSN, Scientist, clinical (histocompatibility and immunogenetics) Only

## 2022-11-30 NOTE — Evaluation (Signed)
Clinical/Bedside Swallow Evaluation Patient Details  Name: Heidi Dominguez MRN: [id number] Date of Birth: [birth year]  Today's Date: 11/30/2022 Time: SLP Start Time (ACUTE ONLY): 1640 SLP Stop Time (ACUTE ONLY): 1700 SLP Time Calculation (min) (ACUTE ONLY): 20 min  Past Medical History:  Past Medical History:  Diagnosis Date   Acute pancreatitis    hx of    Arthritis    "qwhere" (07/04/2018)   Chronic back pain    "all over" (07/04/2018)   Chronic diastolic CHF (congestive heart failure)    Chronic kidney disease    stage  3 chronic kidney disease    Complication of anesthesia    "I have a hard time waking up"   Degenerative joint disease of shoulder region    Dry eye syndrome    Dyspnea    Family history of adverse reaction to anesthesia    "daughter has the shakes when she wakes up"   GERD (gastroesophageal reflux disease)    Heart murmur    High cholesterol    History of hiatal hernia    HOH (hard of hearing)    Hypertension    Impacted cerumen of both ears    hx of    Muscle weakness (generalized)    Osteoarthritis    Overactive bladder    Pancreatitis    hx of    Phlebitis    "BLE"   Rotator cuff tear    right    Tear of right supraspinatus tendon    Thrombocytopenia    hx of    Type II diabetes mellitus    Urinary tract infection    hx of    UTI (urinary tract infection) 09/12/2018   Xerosis cutis    hx of    Past Surgical History:  Past Surgical History:  Procedure Laterality Date   ABDOMINAL AORTOGRAM W/LOWER EXTREMITY N/A 12/22/2020   Procedure: ABDOMINAL AORTOGRAM W/LOWER EXTREMITY;  Surgeon: Heidi Douglas, MD;  Location: [facility];  Service: Cardiovascular;  Laterality: N/A;   ABDOMINAL HYSTERECTOMY     BACK SURGERY     CATARACT EXTRACTION W/ INTRAOCULAR LENS  IMPLANT, BILATERAL Bilateral    CHOLECYSTECTOMY  2016   ERCP N/A 08/30/2018   Procedure: ENDOSCOPIC RETROGRADE CHOLANGIOPANCREATOGRAPHY (ERCP);  Surgeon: Heidi Rigger, MD;   Location: [facility];  Service: Endoscopy;  Laterality: N/A;   FIXATION KYPHOPLASTY     INCISION AND DRAINAGE Right 11/08/2018   Procedure: INCISION AND DRAINAGE right shoulder, placement of antibiotic beads ;  Surgeon: Heidi Low, MD;  Location: [facility];  Service: Orthopedics;  Laterality: Right;   IR US GUIDE BX ASP/DRAIN  11/04/2022   IR US GUIDE BX ASP/DRAIN  11/04/2022   JOINT REPLACEMENT     LAPAROSCOPIC CHOLECYSTECTOMY SINGLE SITE WITH INTRAOPERATIVE CHOLANGIOGRAM N/A 04/11/2015   Procedure: LAPAROSCOPIC LYSIS OF ADHESIONS, LAPAROSCOPIC CHOLECYSTECTOMY WITH INTRAOPERATIVE CHOLANGIOGRAM;  Surgeon: Heidi Soda, MD;  Location: [facility];  Service: General;  Laterality: N/A;   PANCREATIC STENT PLACEMENT  08/30/2018   Procedure: PANCREATIC STENT PLACEMENT;  Surgeon: Heidi Rigger, MD;  Location: [facility];  Service: Endoscopy;;   PERIPHERAL VASCULAR INTERVENTION  12/22/2020   Procedure: PERIPHERAL VASCULAR INTERVENTION;  Surgeon: Heidi Douglas, MD;  Location: [facility];  Service: Cardiovascular;;  left anterior tibial artery   REMOVAL OF STONES  08/30/2018   Procedure: REMOVAL OF STONES;  Surgeon: Heidi Rigger, MD;  Location: [facility];  Service: Endoscopy;;   SHOULDER OPEN ROTATOR CUFF REPAIR Bilateral  SPHINCTEROTOMY  08/30/2018   Procedure: SPHINCTEROTOMY;  Surgeon: Heidi Rigger, MD;  Location: [facility];  Service: Endoscopy;;   TOTAL KNEE ARTHROPLASTY Bilateral    HPI:  89 y.o. female known to SLP services and evaluated for dysphagia on 2/16, 3/15, and 4/8.  Presented to ED with N/V and progressive weakness and lethargy. Concerns for her severe aortic stenosis, decompensated HF, respiratory distress and high risk for worsening.  PMHx significant for chronic hyponatremia, HFpEF, severe aortic stenosis, esophageal dysphagia, asthma, normocytic anemia, T2DM, GERD with large hiatal hernia and recurrent nausea and vomiting, HTN, HLD, recent admission for right shoulder PJI, and PE.  Patient has had several recurrent admissions since February 2024.  Each with different diagnoses including RSV infection, right shoulder shoulder PJI, pulmonary embolism, each admission with underlying hyponatremia. She has a hx of eosphageal dysphagia. Most recent MBS 07/20/22: oral and oropharyngeal function WNL. Esophagram on 11/12/22: "Focal narrowing of the distal esophagus compatible with  stricture, similar to the prior upper GI series 06/20/2017. It is estimated that the esophagus is narrowed to 5-7 mm at this site.  3. Mildly patulous esophagus elsewhere.  4. Prominent esophageal dysmotility with tertiary contractions."  A regular diet, thin liquids have been recommended after each recent evaluation. Pt has been independent with esophageal precautions.    Assessment / Plan / Recommendation  Clinical Impression  [name] was seen upon arrival from ED to the unit. She was obtunded and unable to be awakened.  Daughter was at the bedside with questions about her care and prognosis; explained my role.  Provided oral care due to her daughter's concerns re: secretions; the pt did not respond to deep oral suctioning nor mouth cleaning. Returned to room to provide heated blanket - [name] had spoken with MD and was processing the news that her mother is likely dying.  No further SLP f/u is necessary; provide moisture to lips/tongue with wet sponge if it provides comfort. Our service will respectfully sign off. SLP Visit Diagnosis: Dysphagia, unspecified (R13.10)    Aspiration Risk  Severe aspiration risk    Diet Recommendation   N/a       Other  Recommendations Oral Care Recommendations: Oral care QID    Recommendations for follow up therapy are one component of a multi-disciplinary discharge planning process, led by the attending physician.  Recommendations may be updated based on patient status, additional functional criteria and insurance authorization.  Follow up Recommendations No SLP follow  up        Swallow Study   General HPI: 89 y.o. female known to SLP services and evaluated for dysphagia on 2/16, 3/15, and 4/8.  Presented to ED with N/V and progressive weakness and lethargy. Concerns for her severe aortic stenosis, decompensated HF, respiratory distress and high risk for worsening.  PMHx significant for chronic hyponatremia, HFpEF, severe aortic stenosis, esophageal dysphagia, asthma, normocytic anemia, T2DM, GERD with large hiatal hernia and recurrent nausea and vomiting, HTN, HLD, recent admission for right shoulder PJI, and PE. Patient has had several recurrent admissions since February 2024.  Each with different diagnoses including RSV infection, right shoulder shoulder PJI, pulmonary embolism, each admission with underlying hyponatremia. She has a hx of eosphageal dysphagia. Most recent MBS 07/20/22: oral and oropharyngeal function WNL. Esophagram on 11/12/22: "Focal narrowing of the distal esophagus compatible with  stricture, similar to the prior upper GI series 06/20/2017. It is estimated that the esophagus is narrowed to 5-7 mm at this site.  3. Mildly patulous esophagus elsewhere.  4. Prominent esophageal dysmotility with tertiary contractions."  A regular diet, thin liquids have been recommended after each recent evaluation. Pt has been independent with esophageal precautions. Type of Study: Bedside Swallow Evaluation Previous Swallow Assessment: see HPI Diet Prior to this Study: NPO Temperature Spikes Noted: No Respiratory Status: Nasal cannula History of Recent Intubation: No Behavior/Cognition: Lethargic/Drowsy Oral Cavity Assessment: Within Functional Limits Oral Care Completed by SLP: Yes Oral Cavity - Dentition: Edentulous Patient Positioning: Partially reclined Volitional Cough: Cognitively unable to elicit    Oral/Motor/Sensory Function Overall Oral Motor/Sensory Function: Other (comment) (not following commands)   Ice Chips Ice chips: Not tested   Thin  Liquid Thin Liquid: Impaired Presentation: Spoon Oral Phase Impairments: Poor awareness of bolus    Nectar Thick Nectar Thick Liquid: Not tested   Honey Thick Honey Thick Liquid: Not tested   Puree Puree: Not tested   Solid     Solid: Not tested     Marchelle Folks L. Samson Frederic, MA CCC/SLP Clinical Specialist - Acute Care SLP Acute Rehabilitation Services Office number [phone]  Blenda Mounts Laurice 11/30/2022,5:30 PM

## 2022-12-01 DIAGNOSIS — Z66 Do not resuscitate: Secondary | ICD-10-CM

## 2022-12-01 DIAGNOSIS — E1165 Type 2 diabetes mellitus with hyperglycemia: Secondary | ICD-10-CM | POA: Diagnosis not present

## 2022-12-01 DIAGNOSIS — J9602 Acute respiratory failure with hypercapnia: Secondary | ICD-10-CM

## 2022-12-01 DIAGNOSIS — N1832 Chronic kidney disease, stage 3b: Secondary | ICD-10-CM | POA: Diagnosis not present

## 2022-12-01 DIAGNOSIS — J9601 Acute respiratory failure with hypoxia: Secondary | ICD-10-CM | POA: Insufficient documentation

## 2022-12-01 DIAGNOSIS — I350 Nonrheumatic aortic (valve) stenosis: Secondary | ICD-10-CM

## 2022-12-01 DIAGNOSIS — I2782 Chronic pulmonary embolism: Secondary | ICD-10-CM | POA: Diagnosis not present

## 2022-12-01 DIAGNOSIS — I5033 Acute on chronic diastolic (congestive) heart failure: Secondary | ICD-10-CM | POA: Diagnosis not present

## 2022-12-01 LAB — BLOOD GAS, ARTERIAL
Acid-Base Excess: 10.8 mmol/L — ABNORMAL HIGH (ref 0.0–2.0)
Bicarbonate: 41.4 mmol/L — ABNORMAL HIGH (ref 20.0–28.0)
O2 Saturation: 100 %
Patient temperature: 36.7
pCO2 arterial: 87 mmHg (ref 32–48)
pH, Arterial: 7.28 — ABNORMAL LOW (ref 7.35–7.45)
pO2, Arterial: 129 mmHg — ABNORMAL HIGH (ref 83–108)

## 2022-12-01 LAB — PHOSPHORUS: Phosphorus: 4.9 mg/dL — ABNORMAL HIGH (ref 2.5–4.6)

## 2022-12-01 LAB — CBC WITH DIFFERENTIAL/PLATELET
Abs Immature Granulocytes: 0.13 10*3/uL — ABNORMAL HIGH (ref 0.00–0.07)
Basophils Absolute: 0 10*3/uL (ref 0.0–0.1)
Basophils Relative: 0 %
Eosinophils Absolute: 0 10*3/uL (ref 0.0–0.5)
Eosinophils Relative: 0 %
HCT: 32.3 % — ABNORMAL LOW (ref 36.0–46.0)
Hemoglobin: 9.6 g/dL — ABNORMAL LOW (ref 12.0–15.0)
Immature Granulocytes: 1 %
Lymphocytes Relative: 1 %
Lymphs Abs: 0.2 10*3/uL — ABNORMAL LOW (ref 0.7–4.0)
MCH: 27 pg (ref 26.0–34.0)
MCHC: 29.7 g/dL — ABNORMAL LOW (ref 30.0–36.0)
MCV: 90.7 fL (ref 80.0–100.0)
Monocytes Absolute: 0.4 10*3/uL (ref 0.1–1.0)
Monocytes Relative: 2 %
Neutro Abs: 19.3 10*3/uL — ABNORMAL HIGH (ref 1.7–7.7)
Neutrophils Relative %: 96 %
Platelets: 171 10*3/uL (ref 150–400)
RBC: 3.56 MIL/uL — ABNORMAL LOW (ref 3.87–5.11)
RDW: 15.9 % — ABNORMAL HIGH (ref 11.5–15.5)
WBC: 20.1 10*3/uL — ABNORMAL HIGH (ref 4.0–10.5)
nRBC: 0 % (ref 0.0–0.2)

## 2022-12-01 LAB — COMPREHENSIVE METABOLIC PANEL
ALT: 19 U/L (ref 0–44)
AST: 21 U/L (ref 15–41)
Albumin: 2.6 g/dL — ABNORMAL LOW (ref 3.5–5.0)
Alkaline Phosphatase: 71 U/L (ref 38–126)
Anion gap: 14 (ref 5–15)
BUN: 16 mg/dL (ref 8–23)
CO2: 32 mmol/L (ref 22–32)
Calcium: 9.3 mg/dL (ref 8.9–10.3)
Chloride: 83 mmol/L — ABNORMAL LOW (ref 98–111)
Creatinine, Ser: 0.71 mg/dL (ref 0.44–1.00)
GFR, Estimated: >60 mL/min (ref >60)
Glucose, Bld: 161 mg/dL — ABNORMAL HIGH (ref 70–99)
Potassium: 3.8 mmol/L (ref 3.5–5.1)
Sodium: 129 mmol/L — ABNORMAL LOW (ref 135–145)
Total Bilirubin: 0.5 mg/dL (ref 0.3–1.2)
Total Protein: 6.8 g/dL (ref 6.5–8.1)

## 2022-12-01 LAB — GLUCOSE, CAPILLARY
Glucose-Capillary: 170 mg/dL — ABNORMAL HIGH (ref 70–99)
Glucose-Capillary: 142 mg/dL — ABNORMAL HIGH (ref 70–99)
Glucose-Capillary: 140 mg/dL — ABNORMAL HIGH (ref 70–99)
Glucose-Capillary: 120 mg/dL — ABNORMAL HIGH (ref 70–99)

## 2022-12-01 LAB — MAGNESIUM: Magnesium: 2 mg/dL (ref 1.7–2.4)

## 2022-12-01 MED ORDER — SODIUM CHLORIDE 3 % IN NEBU
4.0000 mL | INHALATION_SOLUTION | Freq: Two times a day (BID) | RESPIRATORY_TRACT | Status: DC
  Administered 2022-12-01 – 2022-12-03 (×4): 4 mL via RESPIRATORY_TRACT
  Filled 2022-12-01 (×6): qty 4

## 2022-12-01 MED ORDER — FUROSEMIDE 10 MG/ML IJ SOLN
80.0000 mg | Freq: Two times a day (BID) | INTRAMUSCULAR | Status: DC
  Administered 2022-12-01 – 2022-12-04 (×6): 80 mg via INTRAVENOUS
  Filled 2022-12-01 (×6): qty 8

## 2022-12-01 MED ORDER — ENOXAPARIN SODIUM 80 MG/0.8ML IJ SOSY
80.0000 mg | PREFILLED_SYRINGE | Freq: Two times a day (BID) | INTRAMUSCULAR | Status: DC
  Administered 2022-12-01 – 2022-12-05 (×9): 80 mg via SUBCUTANEOUS
  Filled 2022-12-01 (×10): qty 0.8

## 2022-12-01 MED ORDER — SODIUM CHLORIDE 0.9 % IV SOLN
3.0000 g | Freq: Three times a day (TID) | INTRAVENOUS | Status: AC
  Administered 2022-12-01 – 2022-12-06 (×15): 3 g via INTRAVENOUS
  Filled 2022-12-01 (×16): qty 8

## 2022-12-01 MED ORDER — ALBUTEROL SULFATE (2.5 MG/3ML) 0.083% IN NEBU: 2.5000 mg | INHALATION_SOLUTION | Freq: Two times a day (BID) | RESPIRATORY_TRACT | Status: DC

## 2022-12-01 MED ORDER — ALBUMIN HUMAN 25 % IV SOLN
25.0000 g | INTRAVENOUS | Status: AC
  Administered 2022-12-01: 25 g via INTRAVENOUS
  Filled 2022-12-01: qty 100

## 2022-12-01 MED ORDER — IPRATROPIUM-ALBUTEROL 0.5-2.5 (3) MG/3ML IN SOLN
3.0000 mL | Freq: Four times a day (QID) | RESPIRATORY_TRACT | Status: DC
  Administered 2022-12-01 – 2022-12-03 (×7): 3 mL via RESPIRATORY_TRACT
  Filled 2022-12-01 (×7): qty 3

## 2022-12-01 MED ORDER — NOREPINEPHRINE 4 MG/250ML-% IV SOLN
0.0000 ug/min | INTRAVENOUS | Status: DC
  Filled 2022-12-01: qty 250

## 2022-12-01 MED ORDER — SODIUM CHLORIDE 0.9 % IV SOLN: 250.0000 mL | INTRAVENOUS | Status: DC

## 2022-12-01 MED ORDER — LIDOCAINE 5 % EX PTCH
1.0000 | MEDICATED_PATCH | CUTANEOUS | Status: DC
  Administered 2022-12-01 – 2022-12-08 (×7): 1 via TRANSDERMAL
  Filled 2022-12-01 (×7): qty 1

## 2022-12-01 MED ORDER — HYDROCORTISONE SOD SUC (PF) 250 MG IJ SOLR
250.0000 mg | INTRAMUSCULAR | Status: AC
  Administered 2022-12-01: 250 mg via INTRAVENOUS
  Filled 2022-12-01: qty 250

## 2022-12-01 MED ORDER — NOREPINEPHRINE 4 MG/250ML-% IV SOLN
2.0000 ug/min | INTRAVENOUS | Status: DC
  Filled 2022-12-01: qty 250

## 2022-12-01 NOTE — Progress Notes (Signed)
ABG results called into Dr Margo Aye

## 2022-12-01 NOTE — Progress Notes (Addendum)
Progress Note   Patient: Heidi Dominguez:[id number] DOB: Jul 11, 1921 DOA: 11/29/2022     2 DOS: the patient was seen and examined on 12/01/2022   Brief hospital course: Mrs. Ephraim was admitted to the hospital with the working diagnosis of heart failure exacerbation.   89 yo female with the past medical history of severe aortic stenosis, hyponatremia, T2DM, pulmonary embolism, asthma, large hiatal hernia, and obesity who presented with altered mentation. Most of the information from her daughter at the bedside, patient has been weak, and lethargic, with nausea, and vomiting that prompted her to bring her mother to the ED.  This is her 3rd hospitalization this year and 4th visit to the ED. Last hospitalization from 11/17/22 to 11/22/23, due to volume overload, and pulmonary embolism (acute small branch posterior right lobe). On her initial physical examination her blood pressure was 160/72, HR 88, RR 21 to 34, and 02 saturation 98%, lungs with rhonchi and rales, heart with S1 and S2 present and rhythmic, positive systolic murmur at the base, severe JVD, abdomen with no distention and positive lower extremity edema.   Na 121, K 5,0 Cl 81, glucose 291, bun 15 cr 0,50   Wbc 11,4 hgb 10,0 plt 351 Urine analysis with SG 1,011, 100 protein, large leukocytes, 11-20.   Head CT with no acute changes.   Chest radiograph with cardiomegaly with bilateral hilar vascular congestion, bilateral interstitial infiltrates more confluent left lower lobe  Follow up chest radiograph with persistent vascular congestion with worsening infiltrate at the left lower lobe.  Had opacity left lower lobe in the past, she is known to have large hiatal h hernia. Chest CT personally reviewed.   EKG 89 bpm, normal axis, normal intervals, sinus rhythm with no significant ST segment or T wave changes.   Patient was placed on furosemide and Bipap  04/19 Patient over night had persistent respiratory failure and hypotension,  shock physiology. Critical care was consulted and patient's daughter was updated.  Family decided to avoid ICU transfer and not perform aggressive invasive interventions, or vasopressors.    This morning patient continue unresponsive and hypotensive, she has bipap in place.  I spoke with her daughter POA, and she understands that her mother is having multiorgan failure with poor prognosis. She is aware that death is imminent despite the current aggressive non invasive therapies.  She is requesting no more blood work. We talk about changing to full comfort care today. She will call back on final decision about changing to full comfort care.   Assessment and Plan: Acute on chronic diastolic CHF (congestive heart failure) Echocardiogram with preserved LV systolic function EF 60 to 65%, moderate LVH, mild RV enlargement, RV systolic function with mild reduction, LA with severe dilatation, RA normal size, small pericardial effusion.  Severe aortic stenosis.   Patient continue with sever volume overload.   Patient with shock physiology, cardiogenic shock. Family would like to avoid aggressive interventions in the setting of poor prognosis.   Plan to continue diuresis with furosemide 80 mg IV q12 hrs.  Acute hypoxemic and hypercapnic respiratory failure due to cardiogenic pulmonary edema, possible pneumonia at the left lower lobe. Discontinue antibiotic therapy.   Continue Bipap for now and IV diuresis. Antibiotics with IV unasyn, Possible transition to comfort care, she will benefit from continuous infusion of morphine for dyspnea and increased work of breathing.   Patient with poor prognosis and high risk of worsening, her daughter confirms DNR and do not intubate.   CKD (  chronic kidney disease) stage 3, GFR 30-59 ml/min Hyponatremia.   Renal function this am with serum cr at 0,71, K is 3.8 with bicarbonate 32.   Will continue furosemide for comfort, no more blood work at this point.    Essential hypertension Patient in cardiogenic shock physiology, with poor prognosis,  No vasopressors at this point.   Pulmonary embolism Discontinue enoxaparin, patient with poor prognosis, will avoid interventions that may produce pain and discomfort.   Right shoulder prosthetic joint infection Patient will benefit from full comfort care.   Diabetes mellitus type 2, controlled Stop insulin therapy. No more blood work, per her family request.  COPD (chronic obstructive pulmonary disease) Supportive bipap for now.   Acute metabolic encephalopathy Multifactorial encephalopathy, poor prognosis,  Class 1 obesity Calculated BMI is 31,7         Subjective: Patient is not responsive, hypotensive, has full face mask bipap in place, her granddaughter is at the bedside   Physical Exam: Vitals:   12/01/22 0530 12/01/22 0600 12/01/22 0700 12/01/22 0715  BP: (!) 101/54 (!) 106/54 (!) 113/55 (!) 87/46  Pulse: 86 83 87 77  Resp: (!) 26 18 (!) 28 (!) 26  Temp:      TempSrc:      SpO2: 99% 99% 100% 100%  Weight:      Height:       Neurology patient not responsive to touch or voice/ ENT with pallor Cardiovascular with S1 and S2 present, with systolic murmur at the base, with no gallops,  Positive JVD Positive edema +++ Respiratory with rales and rhonchi bilaterally with no wheezing Abdomen with no distention  Data Reviewed:    Family Communication: I spoke with patient's granddaughter at the bedside and her daughter over the phone, we talked in detail about patient's condition, plan of care and prognosis and all questions were addressed.   Disposition: Status is: Inpatient Remains inpatient appropriate because: imminent death   Planned Discharge Destination:  imminent death    Critical care time 60 minutes.  Author: Coralie Keens, MD 12/01/2022 8:41 AM  For on call review [email or web address].

## 2022-12-01 NOTE — Progress Notes (Signed)
PCCM:   I was called to bedside to discuss next steps and recommendations.  I spent 45 minutes talking with the patient's daughter and Niece (GI physician) via phone.   They expressed frustrations regarding a ever-changing care plan.  As well as speaking to multiple providers throughout the day.  I offered continued BiPAP support and ICU admission with peripheral norepinephrine for blood pressure support as we proceed with ongoing diuresis.  Patient's family declined ICU transfer.   Josephine Igo, DO [facility] 12/01/2022 3:38 AM

## 2022-12-01 NOTE — Progress Notes (Signed)
RT placed patient on Heated High Flow [place] 40L, 50% per patient and family request.  Patient states the [facility] feels better than the Bipap mask.

## 2022-12-01 NOTE — Progress Notes (Signed)
Patient remains on HFNC resting comfortable. BIPAP is at beside in case patient needs to go back on.

## 2022-12-01 NOTE — Care Management Important Message (Signed)
Important Message  Patient Details  Name: Heidi Dominguez MRN: [id number] Date of Birth: 1920/11/12   Medicare Important Message Given:  Yes     Renie Ora 12/01/2022, 3:08 PM

## 2022-12-01 NOTE — Progress Notes (Signed)
An USGPIV (ultrasound guided PIV) has been placed for short-term vasopressor infusion. PIV started earlier in shift. Tip found to be less than 5 mm deep. Tip marked for vein watch placement. A correctly placed ivWatch must be used when administering Vasopressors. Should this treatment be needed beyond 72 hours, central line access should be obtained.  It will be the responsibility of the bedside nurse to follow best practice to prevent extravasations.

## 2022-12-01 NOTE — Progress Notes (Signed)
Discussed with PA with CCM- Ordered to wait on starting levophed per discussion with PA and Patients Daughter , who starts she would like to think it over and discuss with the doctors in her family after hearing risk versus benefit

## 2022-12-01 NOTE — Consult Note (Signed)
NAME:  Heidi Dominguez, MRN:  [id number], DOB:  [birth year], LOS: 2 ADMISSION DATE:  11/14/2022, CONSULTATION DATE:  12/01/22 REFERRING MD:  Margo Aye CHIEF COMPLAINT:  Hypotension   History of Present Illness:  Heidi Dominguez is a 89 y.o. female who has a PMH as below including but not limited to HFpEF, severe aortic stenosis deemed to be non operable, HTN, HLD, PE, chronic hyponatremia, volume overload, dysphagia, T2DM, GERD, large hiatal hernia, N/V. Of note, she has had multiple admissions in the past 3 months for hyponatremia, CHF exacerbation, shoulder infection, respiratory failure, PE.  She was admitted 4/17 with AMS and decreased PO intake along with ongoing vomiting (chronic given large hiatal hernia).  She was again found to be hyponatremic 2/2 hypervolemia. She was continued on diuresis at increased dose and Cardiology was consulted. Long family meeting had discussing end stage AS and minimal response thus far, cardiology recommended hospice but family was not ready for this. Family very upset regarding prior admissions and pt being discharged pre-maturely as well as family feeling that pt has been written off because of her age more then anything.  On evening of 4/18, she had worsening mental status. ABG revealed hypercapnic and hypoxic respiratory failure for which she was started on BiPAP. Daughter mentions she had choked on some pills and apple sauce earlier in the day and since then, had become more sleepy. CXR showed left basilar retrocardiac opacification. She then developed hypotension with SBP down to the 80s. Given volume overload, PCCM asked to see in consultation for peripheral vasopressors.  I had lengthy discussion with daughter as well as a niece over the phone who is a GI physician. I explained the risks of vasopressors with arrhythmias, overdiuresis with severe AS, as well as BiPAP with an unresponsive pt who has a hx of aspiration and chronic vomiting with large hiatal hernia.  Family discussing options further with physician niece and have also asked to speak with CCM attending.    Pertinent  Medical History:  has Weakness; Bradycardia; Acute lower UTI; Hematochezia; Essential hypertension; Colitis; Rectal bleeding; Ischemic colitis (HCC); DOE (dyspnea on exertion); Epigastric abdominal pain; Acute gallstone pancreatitis s/p lap chole w IOC 04/11/2015; Diabetes mellitus type 2, controlled; Gallstone pancreatitis; Mitral regurgitation; Chronic diastolic CHF (congestive heart failure); Pre-operative cardiovascular examination, high risk surgery; Choledocholithiasis with chronic cholecystitis, nonobstructing; Bilateral hearing loss; Bilateral impacted cerumen; Neoplasm of uncertain behavior of pharynx; Post-nasal drainage; Presbycusis of both ears; Sensorineural hearing loss (SNHL), bilateral; Subjective tinnitus of both ears; Upper airway cough syndrome; Elevated LFTs; CKD (chronic kidney disease) stage 3, GFR 30-59 ml/min; Abdominal pain; Acute recurrent pancreatitis; Acute pancreatitis; History of hemiarthroplasty of right shoulder; Rhinitis, chronic; Unspecified chronic bronchitis; Gastroesophageal reflux disease; Diabetic foot ulcer; Diabetic neuropathy; Gait abnormality; Diabetic retinopathy associated with type 2 diabetes mellitus; Hardening of the aorta (main artery of the heart); Thrombocytopenia; Peripheral artery disease; Osteomyelitis; COPD (chronic obstructive pulmonary disease); Acute on chronic respiratory failure with hypoxia; Bilateral lower extremity edema; Right shoulder prosthetic joint infection; Hyponatremia; Acute on chronic diastolic CHF (congestive heart failure); Normocytic anemia; Obesity (BMI 30-39.9); Pulmonary embolism; Severe aortic stenosis; Confusion; Acute pulmonary embolism; Fluid overload; Heart failure; Acute metabolic encephalopathy; and Class 1 obesity on their problem list.  Significant Hospital Events: Including procedures, antibiotic start and  stop dates in addition to other pertinent events   4/17 admit. 4/19 PCCM consult for vasopressors  Interim History / Subjective:  Unresponsive on BiPAP.  Objective:  Blood pressure (!) 85/46, pulse 80,  temperature 98.9 F (37.2 C), temperature source Oral, resp. rate (!) 29, height 5\' 4"  (1.626 m), weight 84 kg, SpO2 100 %.    FiO2 (%):  [5 %-100 %] 100 % PEEP:  [5 cmH20] 5 cmH20   Intake/Output Summary (Last 24 hours) at 12/01/2022 0137 Last data filed at 12/01/2022 0100 Gross per 24 hour  Intake 250.01 ml  Output --  Net 250.01 ml   Filed Weights   11/30/22 0205  Weight: 84 kg    Examination: General: Elderly female, unresponsive on BiPAP. Neuro: Unresponsive, does not follow any commands. HEENT: [place]/AT. Sclerae anicteric. BiPAP in place. Cardiovascular: RRR, no M/R/G.  Lungs: Respirations even and unlabored.  Coarse bases L > R. Abdomen: BS x 4, soft, NT/ND.  Musculoskeletal: No gross deformities, 2+ edema.  Skin: Intact, warm, no rashes.  Labs/imaging personally reviewed:  CXR 4/18 > left basilar retrocardiac opacification  Assessment & Plan:   End stage severe AS - not an operable candidate. Hx HFpEF, small pericardial effusion, HTN, HLD. - Cautious diuresis as able, goal gradual net neg balance as able but caution to overshoot with her degree of AS. - Might need vasopressor support, if so will use Phenylephrine. Family still deciding.  Acute hypoxic and hypercarbic respiratory failure - now on BiPAP. Acute pulmonary edema. Hx COPD. - Continue BiPAP, risks explained to family regarding aspiration with hx dysphagia and chronic vomiting while on BiPAP. - Diuresis as able, though caution for over diuresis given her degree of AS. - BD's.  Acute metabolic encephalopathy - 2/2 hypercapnia. - BiPAP as above. - Avoid sedating meds.  Concern for aspiration PNA - daughter reports coughing/choking with apple sauce and multiple pills 4/18. - Continue Unasyn. - Sputum  culture if able.   Hx Chronic hyponatremia 2/2 hypervolemia, CKD. - Diuresis as able.  Hx PE - on Eliquis. - Continue Lovenox currently.  Hx DM. - SSI.  Hx right shoulder prosthetic joint infection. - Pain control.  DNR/DNI status - confirmed with daughter. - Daughter and family still deciding on vasopressor use or not.   Best practice (evaluated daily):  Diet/type: NPO DVT prophylaxis: LMWH GI prophylaxis: N/A Lines: N/A Foley:  N/A Code Status:  DNR Last date of multidisciplinary goals of care discussion: None yet.  Labs   CBC: Recent Labs  Lab 12/06/2022 1533 11/23/2022 1638 11/30/22 0421  WBC 11.4*  --  10.9*  NEUTROABS 9.4*  --  8.9*  HGB 10.0* 12.2 9.1*  HCT 31.7* 36.0 30.7*  MCV 86.8  --  89.2  PLT 351  --  348    Basic Metabolic Panel: Recent Labs  Lab 11/27/2022 1638 12/06/2022 2214 11/30/22 0421  NA 121* 123* 125*  K 5.0 3.7 4.3  CL 81* 80* 82*  CO2  --  34* 35*  GLUCOSE 291* 240* 162*  BUN 15 13 14   CREATININE 0.50 0.59 0.59  CALCIUM  --  9.2 9.0   GFR: Estimated Creatinine Clearance: 40.2 mL/min (by C-G formula based on SCr of 0.59 mg/dL). Recent Labs  Lab 11/27/2022 1533 11/30/22 0421  WBC 11.4* 10.9*    Liver Function Tests: Recent Labs  Lab 12/10/2022 2214  AST 15  ALT 13  ALKPHOS 78  BILITOT 0.5  PROT 6.9  ALBUMIN 2.2*   No results for input(s): "LIPASE", "AMYLASE" in the last 168 hours. No results for input(s): "AMMONIA" in the last 168 hours.  ABG    Component Value Date/Time   PHART 7.24 (L) 11/30/2022 2238  PCO2ART 93 (HH) 11/30/2022 2238   PO2ART 68 (L) 11/30/2022 2238   HCO3 39.4 (H) 11/30/2022 2238   TCO2 37 (H) 11/17/2022 1638   O2SAT 94.4 11/30/2022 2238     Coagulation Profile: No results for input(s): "INR", "PROTIME" in the last 168 hours.  Cardiac Enzymes: No results for input(s): "CKTOTAL", "CKMB", "CKMBINDEX", "TROPONINI" in the last 168 hours.  HbA1C: Hgb A1c MFr Bld  Date/Time Value Ref Range  Status  10/02/2022 12:57 AM 7.5 (H) 4.8 - 5.6 % Final    Comment:    (NOTE) Pre diabetes:          5.7%-6.4%  Diabetes:              >6.4%  Glycemic control for   <7.0% adults with diabetes   03/14/2021 07:18 PM 6.9 (H) 4.8 - 5.6 % Final    Comment:    (NOTE) Pre diabetes:          5.7%-6.4%  Diabetes:              >6.4%  Glycemic control for   <7.0% adults with diabetes     CBG: Recent Labs  Lab 11/30/22 0740 11/30/22 1158 11/30/22 1300 11/30/22 2033 11/30/22 2352  GLUCAP 135* 133* 140* 146* 159*    Review of Systems:   Unable to obtain as pt is encephalopathic.  Past Medical History:  She,  has a past medical history of Acute pancreatitis, Arthritis, Chronic back pain, Chronic diastolic CHF (congestive heart failure), Chronic kidney disease, Complication of anesthesia, Degenerative joint disease of shoulder region, Dry eye syndrome, Dyspnea, Family history of adverse reaction to anesthesia, GERD (gastroesophageal reflux disease), Heart murmur, High cholesterol, History of hiatal hernia, HOH (hard of hearing), Hypertension, Impacted cerumen of both ears, Muscle weakness (generalized), Osteoarthritis, Overactive bladder, Pancreatitis, Phlebitis, Rotator cuff tear, Tear of right supraspinatus tendon, Thrombocytopenia, Type II diabetes mellitus, Urinary tract infection, UTI (urinary tract infection) (09/12/2018), and Xerosis cutis.   Surgical History:   Past Surgical History:  Procedure Laterality Date   ABDOMINAL AORTOGRAM W/LOWER EXTREMITY N/A 12/22/2020   Procedure: ABDOMINAL AORTOGRAM W/LOWER EXTREMITY;  Surgeon: Leonie Douglas, MD;  Location: [facility];  Service: Cardiovascular;  Laterality: N/A;   ABDOMINAL HYSTERECTOMY     BACK SURGERY     CATARACT EXTRACTION W/ INTRAOCULAR LENS  IMPLANT, BILATERAL Bilateral    CHOLECYSTECTOMY  2016   ERCP N/A 08/30/2018   Procedure: ENDOSCOPIC RETROGRADE CHOLANGIOPANCREATOGRAPHY (ERCP);  Surgeon: Vida Rigger, MD;   Location: [facility];  Service: Endoscopy;  Laterality: N/A;   FIXATION KYPHOPLASTY     INCISION AND DRAINAGE Right 11/08/2018   Procedure: INCISION AND DRAINAGE right shoulder, placement of antibiotic beads ;  Surgeon: Beverely Low, MD;  Location: [facility];  Service: Orthopedics;  Laterality: Right;   IR US GUIDE BX ASP/DRAIN  11/04/2022   IR US GUIDE BX ASP/DRAIN  11/04/2022   JOINT REPLACEMENT     LAPAROSCOPIC CHOLECYSTECTOMY SINGLE SITE WITH INTRAOPERATIVE CHOLANGIOGRAM N/A 04/11/2015   Procedure: LAPAROSCOPIC LYSIS OF ADHESIONS, LAPAROSCOPIC CHOLECYSTECTOMY WITH INTRAOPERATIVE CHOLANGIOGRAM;  Surgeon: Karie Soda, MD;  Location: [facility];  Service: General;  Laterality: N/A;   PANCREATIC STENT PLACEMENT  08/30/2018   Procedure: PANCREATIC STENT PLACEMENT;  Surgeon: Vida Rigger, MD;  Location: [facility];  Service: Endoscopy;;   PERIPHERAL VASCULAR INTERVENTION  12/22/2020   Procedure: PERIPHERAL VASCULAR INTERVENTION;  Surgeon: Leonie Douglas, MD;  Location: [facility];  Service: Cardiovascular;;  left anterior tibial artery  REMOVAL OF STONES  08/30/2018   Procedure: REMOVAL OF STONES;  Surgeon: Vida Rigger, MD;  Location: [facility];  Service: Endoscopy;;   SHOULDER OPEN ROTATOR CUFF REPAIR Bilateral    SPHINCTEROTOMY  08/30/2018   Procedure: SPHINCTEROTOMY;  Surgeon: Vida Rigger, MD;  Location: [facility];  Service: Endoscopy;;   TOTAL KNEE ARTHROPLASTY Bilateral      Social History:   reports that she has never smoked. She has never used smokeless tobacco. She reports that she does not drink alcohol and does not use drugs.   Family History:  Her family history includes Diabetes Mellitus I in her mother; Hypertension in her father and mother.   Allergies Allergies  Allergen Reactions   Augmentin [Amoxicillin-Pot Clavulanate] Diarrhea   Bactrim [Sulfamethoxazole-Trimethoprim] Diarrhea and Itching   Clonidine Hcl Other (See Comments)    Unknown reaction  Not  documented on MAR  Other Reaction(s): GI Intolerance, Other (See Comments)  Unknown., Unknown., Unknown.   Codeine Other (See Comments)    "Makes me out of my head, loopy"   Hydrocodone Other (See Comments)    "makes me go out of my head, loopy"   Other Other (See Comments)    Any type of narcotics Feels "loopy"    Sulfa Antibiotics Itching   Sulfamethoxazole Diarrhea and Itching    Not documented on MAR  Other Reaction(s): Diarrhea   Verapamil Diarrhea    Other Reaction(s): Diarrhea, GI Intolerance, Other (See Comments)   Zestril [Lisinopril] Cough   Azulfidine [Sulfasalazine] Itching     Home Medications  Prior to Admission medications   Medication Sig Start Date End Date Taking? Authorizing Provider  acetaminophen (TYLENOL) 325 MG tablet Take 650 mg by mouth 3 (three) times daily.   Yes [provider]  acetaZOLAMIDE (DIAMOX) 125 MG tablet Take 1 tablet (125 mg total) by mouth 2 (two) times daily. 11/22/22  Yes Rhetta Mura, MD  albuterol (VENTOLIN HFA) 108 (90 Base) MCG/ACT inhaler Inhale 2 puffs into the lungs every 6 (six) hours as needed for wheezing or shortness of breath.   Yes [provider]  amLODipine (NORVASC) 5 MG tablet Take 1 tablet (5 mg total) by mouth daily. 11/23/22  Yes Rhetta Mura, MD  apixaban (ELIQUIS) 5 MG TABS tablet Take 1 tablet (5 mg total) by mouth 2 (two) times daily. 11/25/22  Yes Rhetta Mura, MD  budesonide (PULMICORT) 0.5 MG/2ML nebulizer solution Take 2 mLs (0.5 mg total) by nebulization in the morning and at bedtime. 10/16/22  Yes Cobb, Ruby Cola, NP  cefadroxil (DURICEF) 500 MG capsule Take 2 capsules (1,000 mg total) by mouth 2 (two) times daily for 23 days. 11/22/22 12/15/22 Yes Rhetta Mura, MD  Cholecalciferol (VITAMIN D3) 25 MCG (1000 UT) capsule Take 1,000 Units by mouth every Monday.   Yes [provider]  diclofenac Sodium (VOLTAREN ARTHRITIS PAIN) 1 % GEL Apply 2 g topically 3  (three) times daily. To both shoulders.   Yes [provider]  furosemide (LASIX) 40 MG tablet Take 1 tablet (40 mg total) by mouth daily. 11/23/22  Yes Rhetta Mura, MD  guaiFENesin (MUCINEX) 600 MG 12 hr tablet Take 600 mg by mouth 2 (two) times daily.   Yes [provider]  hypromellose (GENTEAL SEVERE) 0.3 % GEL ophthalmic ointment Place 1 Application into both eyes 2 (two) times daily.   Yes [provider]  insulin lispro (HUMALOG) 100 UNIT/ML injection Inject 0-12 Units into the skin 3 (three) times daily before meals. Per sliding  scale: If blood sugar is less than 70, call NP/PA. If blood sugar is 70 to 200, give 0 units. If blood sugar is 201 to 250, give 2 units. If blood sugar is 251 to 300, give 4 units. If blood sugar is 301 to 350, give 6 units. If blood sugar is 351 to 400, give 8 units. If blood sugar is 401 to 450, give 10 units. If blood sugar is 451 to 600, give 12 units. Recheck in 2 hours.   Yes [provider]  Lactobacillus (PROBIOTIC ACIDOPHILUS) CAPS Take 1 capsule by mouth daily.   Yes [provider]  LEVEMIR FLEXPEN 100 UNIT/ML FlexPen Inject 8 Units into the skin daily. 11/02/22  Yes [provider]  Multiple Vitamins-Minerals (PRESERVISION AREDS) CAPS Take 1 capsule by mouth daily.   Yes [provider]  nystatin (MYCOSTATIN) 500000 units TABS tablet Take 500,000 Units by mouth 3 (three) times daily. 11/28/22 12/19/22 Yes [provider]  OMEPRAZOLE PO Take 20 mg by mouth daily. Omeprazole 20 mg DR disintegrating tablet.   Yes [provider]  ondansetron (ZOFRAN) 4 MG tablet Take 4 mg by mouth every 8 (eight) hours as needed for nausea or vomiting.   Yes [provider]  Polyethyl Glycol-Propyl Glycol (LUBRICANT EYE DROPS) 0.4-0.3 % SOLN Place 2 drops into both eyes daily.   Yes [provider]  polyethylene glycol powder (GLYCOLAX/MIRALAX) 17 GM/SCOOP powder Take  17 g by mouth at bedtime.   Yes [provider]  polyvinyl alcohol (LIQUIFILM TEARS) 1.4 % ophthalmic solution Place 1 drop into both eyes in the morning, at noon, in the evening, and at bedtime. Following injection treatment   Yes [provider]  predniSONE (DELTASONE) 1 MG tablet Take 2 tablets (2 mg total) by mouth daily with breakfast. Patient taking differently: Take 1 mg by mouth daily with breakfast. 11/23/22  Yes Rhetta Mura, MD  apixaban (ELIQUIS) 5 MG TABS tablet Take 2 tablets (10 mg total) by mouth 2 (two) times daily for 3 days. 11/22/22 11/25/22  Rhetta Mura, MD  cefadroxil (DURICEF) 500 MG capsule Take 1 capsule (500 mg total) by mouth 2 (two) times daily. 12/16/22   Rhetta Mura, MD  dextromethorphan-guaiFENesin (MUCINEX DM) 30-600 MG 12hr tablet Take 1 tablet by mouth 2 (two) times daily. Patient not taking: Reported on 11/30/2022 10/16/22   Noemi Chapel, NP     CC time: 60 minutes.   Rutherford Guys, PA - [facility] For pager details, please see AMION or use Epic chat  After 1900, please call [facility] for cross coverage needs 12/01/2022, 1:37 AM

## 2022-12-02 DIAGNOSIS — N1832 Chronic kidney disease, stage 3b: Secondary | ICD-10-CM | POA: Diagnosis not present

## 2022-12-02 DIAGNOSIS — J9601 Acute respiratory failure with hypoxia: Secondary | ICD-10-CM | POA: Diagnosis not present

## 2022-12-02 DIAGNOSIS — Z515 Encounter for palliative care: Secondary | ICD-10-CM | POA: Diagnosis not present

## 2022-12-02 DIAGNOSIS — T8450XA Infection and inflammatory reaction due to unspecified internal joint prosthesis, initial encounter: Secondary | ICD-10-CM

## 2022-12-02 DIAGNOSIS — I2782 Chronic pulmonary embolism: Secondary | ICD-10-CM | POA: Diagnosis not present

## 2022-12-02 DIAGNOSIS — Z7189 Other specified counseling: Secondary | ICD-10-CM | POA: Diagnosis not present

## 2022-12-02 DIAGNOSIS — I509 Heart failure, unspecified: Secondary | ICD-10-CM | POA: Diagnosis not present

## 2022-12-02 DIAGNOSIS — I5033 Acute on chronic diastolic (congestive) heart failure: Secondary | ICD-10-CM | POA: Diagnosis not present

## 2022-12-02 LAB — GLUCOSE, CAPILLARY
Glucose-Capillary: 126 mg/dL — ABNORMAL HIGH (ref 70–99)
Glucose-Capillary: 139 mg/dL — ABNORMAL HIGH (ref 70–99)

## 2022-12-02 NOTE — Evaluation (Signed)
Clinical/Bedside Swallow Evaluation Patient Details  Name: Heidi Dominguez MRN: [id number] Date of Birth: 1920-08-26  Today's Date: 12/02/2022 Time: SLP Start Time (ACUTE ONLY): 1430 SLP Stop Time (ACUTE ONLY): 1500 SLP Time Calculation (min) (ACUTE ONLY): 30 min  Past Medical History:  Past Medical History:  Diagnosis Date   Acute pancreatitis    hx of    Arthritis    "qwhere" (07/04/2018)   Chronic back pain    "all over" (07/04/2018)   Chronic diastolic CHF (congestive heart failure)    Chronic kidney disease    stage  3 chronic kidney disease    Complication of anesthesia    "I have a hard time waking up"   Degenerative joint disease of shoulder region    Dry eye syndrome    Dyspnea    Family history of adverse reaction to anesthesia    "daughter has the shakes when she wakes up"   GERD (gastroesophageal reflux disease)    Heart murmur    High cholesterol    History of hiatal hernia    HOH (hard of hearing)    Hypertension    Impacted cerumen of both ears    hx of    Muscle weakness (generalized)    Osteoarthritis    Overactive bladder    Pancreatitis    hx of    Phlebitis    "BLE"   Rotator cuff tear    right    Tear of right supraspinatus tendon    Thrombocytopenia    hx of    Type II diabetes mellitus    Urinary tract infection    hx of    UTI (urinary tract infection) 09/12/2018   Xerosis cutis    hx of    Past Surgical History:  Past Surgical History:  Procedure Laterality Date   ABDOMINAL AORTOGRAM W/LOWER EXTREMITY N/A 12/22/2020   Procedure: ABDOMINAL AORTOGRAM W/LOWER EXTREMITY;  Surgeon: Leonie Douglas, MD;  Location: [facility];  Service: Cardiovascular;  Laterality: N/A;   ABDOMINAL HYSTERECTOMY     BACK SURGERY     CATARACT EXTRACTION W/ INTRAOCULAR LENS  IMPLANT, BILATERAL Bilateral    CHOLECYSTECTOMY  2016   ERCP N/A 08/30/2018   Procedure: ENDOSCOPIC RETROGRADE CHOLANGIOPANCREATOGRAPHY (ERCP);  Surgeon: Vida Rigger, MD;   Location: [facility];  Service: Endoscopy;  Laterality: N/A;   FIXATION KYPHOPLASTY     INCISION AND DRAINAGE Right 11/08/2018   Procedure: INCISION AND DRAINAGE right shoulder, placement of antibiotic beads ;  Surgeon: Beverely Low, MD;  Location: [facility];  Service: Orthopedics;  Laterality: Right;   IR US GUIDE BX ASP/DRAIN  11/04/2022   IR US GUIDE BX ASP/DRAIN  11/04/2022   JOINT REPLACEMENT     LAPAROSCOPIC CHOLECYSTECTOMY SINGLE SITE WITH INTRAOPERATIVE CHOLANGIOGRAM N/A 04/11/2015   Procedure: LAPAROSCOPIC LYSIS OF ADHESIONS, LAPAROSCOPIC CHOLECYSTECTOMY WITH INTRAOPERATIVE CHOLANGIOGRAM;  Surgeon: Karie Soda, MD;  Location: [facility];  Service: General;  Laterality: N/A;   PANCREATIC STENT PLACEMENT  08/30/2018   Procedure: PANCREATIC STENT PLACEMENT;  Surgeon: Vida Rigger, MD;  Location: [facility];  Service: Endoscopy;;   PERIPHERAL VASCULAR INTERVENTION  12/22/2020   Procedure: PERIPHERAL VASCULAR INTERVENTION;  Surgeon: Leonie Douglas, MD;  Location: [facility];  Service: Cardiovascular;;  left anterior tibial artery   REMOVAL OF STONES  08/30/2018   Procedure: REMOVAL OF STONES;  Surgeon: Vida Rigger, MD;  Location: [facility];  Service: Endoscopy;;   SHOULDER OPEN ROTATOR CUFF REPAIR Bilateral  SPHINCTEROTOMY  08/30/2018   Procedure: SPHINCTEROTOMY;  Surgeon: Vida Rigger, MD;  Location: [facility];  Service: Endoscopy;;   TOTAL KNEE ARTHROPLASTY Bilateral    HPI:  89 y.o. female known to SLP services and evaluated for dysphagia on 2/16, 3/15, and 4/8.  Presented to ED with N/V and progressive weakness and lethargy. Concerns for her severe aortic stenosis, decompensated HF, respiratory distress and high risk for worsening.  PMHx significant for chronic hyponatremia, HFpEF, severe aortic stenosis, esophageal dysphagia, asthma, normocytic anemia, T2DM, GERD with large hiatal hernia and recurrent nausea and vomiting, HTN, HLD, recent admission for right shoulder PJI, and PE.  Patient has had several recurrent admissions since February 2024.  Each with different diagnoses including RSV infection, right shoulder shoulder PJI, pulmonary embolism, each admission with underlying hyponatremia. She has a hx of eosphageal dysphagia. Most recent MBS 07/20/22: oral and oropharyngeal function WNL. Esophagram on 11/12/22: "Focal narrowing of the distal esophagus compatible with  stricture, similar to the prior upper GI series 06/20/2017. It is estimated that the esophagus is narrowed to 5-7 mm at this site.  3. Mildly patulous esophagus elsewhere.  4. Prominent esophageal dysmotility with tertiary contractions."  A regular diet, thin liquids have been recommended after each recent evaluation. Pt has been independent with esophageal precautions. Patient evaluated at bedside  on 11/30/22 and as she was obtunded and unable to manage secretions or participate in PO trials. SLP service ordered again on 4/20 for re-evaluation as daughter wants to see if there is any PO patient could safely consume.    Assessment / Plan / Recommendation  Clinical Impression  Patient presented with a pharyngeal phase of swallow that appeared [facility] and a mild oral phase impairment. She took multiple, successive straw sips of thin liquids (water) without overt s/s aspiration or penetration and with good swallow initiation. With spoon bites of gelatin, she exhibited prolonged mastication and oral transit but no observed coughing, throat clearing, etc. Patient's RR would periodically increase from mid-20's to mid 40's but fluctuated throughout session during, after and in absence of PO intake. SpO2 generally stayed in mid-90% but did decrease a couple times to high 80%. Patient's respiratory status is the main hindrance to patient's safety with PO's however she also does have h/o esophageal dysphagia. SLP recommending PO diet of full liquids (thin liquid consistency) with full supervision from staff or daughter. In addition,  patient must only be fed when she is fully awake and alert, when she is actively participating and PO intake must be ceased if patient's vitals change significantly and/or if she has increased coughing, throat clearing or s/s of respiratory distress. SLP educated daughter and will follow patient for toleration. SLP Visit Diagnosis: Dysphagia, unspecified (R13.10)    Aspiration Risk  Mild aspiration risk;Moderate aspiration risk    Diet Recommendation Thin liquid;Other (Comment) (full liquids)   Liquid Administration via: Cup;Straw Medication Administration: Crushed with puree Supervision: Full supervision/cueing for compensatory strategies;Staff to assist with self feeding Compensations: Slow rate;Small sips/bites Postural Changes: Seated upright at 90 degrees;Remain upright for at least 30 minutes after po intake    Other  Recommendations Oral Care Recommendations: Oral care BID;Staff/trained caregiver to provide oral care    Recommendations for follow up therapy are one component of a multi-disciplinary discharge planning process, led by the attending physician.  Recommendations may be updated based on patient status, additional functional criteria and insurance authorization.  Follow up Recommendations No SLP follow up      Assistance Recommended at  Discharge    Functional Status Assessment Patient has had a recent decline in their functional status and demonstrates the ability to make significant improvements in function in a reasonable and predictable amount of time.  Frequency and Duration min 2x/week  1 week       Prognosis Prognosis for improved oropharyngeal function: Guarded Barriers to Reach Goals: Severity of deficits      Swallow Study   General Date of Onset: 12/02/22 HPI: 89 y.o. female known to SLP services and evaluated for dysphagia on 2/16, 3/15, and 4/8.  Presented to ED with N/V and progressive weakness and lethargy. Concerns for her severe aortic stenosis,  decompensated HF, respiratory distress and high risk for worsening.  PMHx significant for chronic hyponatremia, HFpEF, severe aortic stenosis, esophageal dysphagia, asthma, normocytic anemia, T2DM, GERD with large hiatal hernia and recurrent nausea and vomiting, HTN, HLD, recent admission for right shoulder PJI, and PE. Patient has had several recurrent admissions since February 2024.  Each with different diagnoses including RSV infection, right shoulder shoulder PJI, pulmonary embolism, each admission with underlying hyponatremia. She has a hx of eosphageal dysphagia. Most recent MBS 07/20/22: oral and oropharyngeal function WNL. Esophagram on 11/12/22: "Focal narrowing of the distal esophagus compatible with  stricture, similar to the prior upper GI series 06/20/2017. It is estimated that the esophagus is narrowed to 5-7 mm at this site.  3. Mildly patulous esophagus elsewhere.  4. Prominent esophageal dysmotility with tertiary contractions."  A regular diet, thin liquids have been recommended after each recent evaluation. Pt has been independent with esophageal precautions. Patient evaluated at bedside  on 11/30/22 and as she was obtunded and unable to manage secretions or participate in PO trials. SLP service ordered again on 4/20 for re-evaluation as daughter wants to see if there is any PO patient could safely consume. Type of Study: Bedside Swallow Evaluation Previous Swallow Assessment: see HPI Diet Prior to this Study: NPO Temperature Spikes Noted: No Respiratory Status: Nasal cannula;Increased respiratory rate History of Recent Intubation: No Behavior/Cognition: Alert;Cooperative;Requires cueing Oral Cavity Assessment: Within Functional Limits Oral Care Completed by SLP: Recent completion by staff Oral Cavity - Dentition: Edentulous Self-Feeding Abilities: Needs assist;Needs set up Patient Positioning: Upright in bed Baseline Vocal Quality: Low vocal intensity Volitional Cough: Cognitively  unable to elicit Volitional Swallow: Unable to elicit    Oral/Motor/Sensory Function Overall Oral Motor/Sensory Function: Within functional limits   Ice Chips     Thin Liquid Thin Liquid: Within functional limits Presentation: Straw    Nectar Thick     Honey Thick     Puree Puree: Not tested   Solid     Solid: Impaired Presentation: Spoon Oral Phase Functional Implications: Impaired mastication;Prolonged oral transit Other Comments: (gelatin)     Angela Nevin, MA, CCC-SLP Speech Therapy

## 2022-12-02 NOTE — Progress Notes (Signed)
Patient is diaphoretic, high respiratory rate and with increased work of breathing.  Signs of respiratory distress after eating.  Will keep patient NPO.  Daughter has been informed not to feed patient due to risk of aspiration and respiratory distress.

## 2022-12-02 NOTE — Consult Note (Signed)
Palliative Care Consult Note                                  Date: 12/02/2022   Patient Name: Heidi Dominguez  DOB: 01/07/1921  MRN: [id number]  Age / Sex: 89 y.o., female  PCP: Heidi Laughter, MD Referring Physician: Coralie Dominguez  Reason for Consultation: Establishing goals of care  HPI/Patient Profile: 89 y.o. female  with past medical history of HFpEF, severe aortic stenosis, T2DM, pulmonary embolism, asthma, large hiatal hernia, and hyponatremia who presented to [facility] ED on 11/29/2022 with altered mental status.  She was admitted with acute on chronic diastolic CHF, volume overload, and acute respiratory failure.  4/19 -patient had persistent respiratory failure and hypotension likely due to shock.  Family declined transfer to ICU and vasopressors.  4/20 - patient is more reactive, but continues to be very weak. She showed signs of respiratory distress after eating and is now NPO.    Subjective:   I have reviewed medical records including progress notes, labs and imaging, and discussed with Dr. Ella Dominguez   Assessed the patient at bedside. She is on Bipap and appears to be resting.   I met with daughter/Heidi Dominguez outside the room to discuss diagnosis, prognosis, GOC, disposition, and options.  Patient is known to PMT from a hospitalization in 2022. At that time, daughter was upset that PMT had been consulted. I re-introduced Palliative Medicine as specialized medical care for people living with serious illness. It focuses on providing relief from the symptoms and stress of a serious illness.   We discussed patient's current medical and what it means in the larger context of her ongoing co-morbidities. Current clinical status was reviewed. Natural disease trajectory of chronic illness was discussed. I shared my concern that patient is approaching end of life.   Created space and opportunity for daughter to express thoughts and  feelings regarding current medical situation. Values and goals of care important to patient and family were attempted to be elicited.  Heidi Dominguez shares that she loves her mother very much and is not ready to "let her go". She plans to advocate for her mother until she "takes her last breath".  However, she states that she does not have "rose colored glasses" and is seeing the reality of her current situation.     Questions and concerns addressed. Daughter encouraged to call with questions or concerns. "Hard choices" book given.     Review of Systems  Unable to perform ROS   Objective:   Primary Diagnoses: Present on Admission:  Acute on chronic diastolic CHF (congestive heart failure)  CKD (chronic kidney disease) stage 3, GFR 30-59 ml/min  COPD (chronic obstructive pulmonary disease)  Essential hypertension  Pulmonary embolism  Right shoulder prosthetic joint infection   Physical Exam Constitutional:      General: She is not in acute distress.    Appearance: She is ill-appearing.  Pulmonary:     Comments: Bipap    Vital Signs:  BP (!) 108/52 (BP Location: Right Arm)   Pulse 83   Temp (!) 97.5 F (36.4 C)   Resp (!) 22   Ht  (1.626 m)   Wt 85 kg   SpO2 100%   BMI 32.17 kg/m   Palliative Assessment/Data: ***     Assessment & Plan:   SUMMARY OF RECOMMENDATIONS   DNR/DNI Continue current supportive interventions, but avoid any escalation of  care   Primary Decision Maker: daughter  Code Status/Advance Care Planning: {Palliative Code status:23503}  Symptom Management:  ***  Prognosis:  {Palliative Care Prognosis:23504}  Discharge Planning:  {Palliative dispostion:23505}   Discussed with: ***    Thank you for allowing [place] to participate in the care of Heidi Dominguez   Time Total: ***  Greater than 50%  of this time was spent counseling and coordinating care related to the above assessment and plan.  Signed by: Sherlean Foot,  NP Palliative Medicine Team  Team Phone # [phone]  For individual providers, please see AMION

## 2022-12-02 NOTE — Progress Notes (Addendum)
Patients family wanted patient removed from BiPAP. Patient placed on HHFNC 50L, 50% at this time. However, within 10 minutes patient with tachypnea and labored breathing. Patients sister here to visit. Want her to remain on [facility] for the visit. Will return to place back on BiPAP after visit.

## 2022-12-02 NOTE — Progress Notes (Signed)
Progress Note   Patient: Heidi Dominguez ZOX:[id number] DOB: Feb 22, 1921 DOA: 11/29/2022     3 DOS: the patient was seen and examined on 12/02/2022   Brief hospital course: Mrs. Groner was admitted to the hospital with the working diagnosis of heart failure exacerbation.   89 yo female with the past medical history of severe aortic stenosis, hyponatremia, T2DM, pulmonary embolism, asthma, large hiatal hernia, and obesity who presented with altered mentation. Most of the information from her daughter at the bedside, patient has been weak, and lethargic, with nausea, and vomiting that prompted her to bring her mother to the ED.  This is her 3rd hospitalization this year and 4th visit to the ED. Last hospitalization from 11/17/22 to 11/22/23, due to volume overload, and pulmonary embolism (acute small branch posterior right lobe). On her initial physical examination her blood pressure was 160/72, HR 88, RR 21 to 34, and 02 saturation 98%, lungs with rhonchi and rales, heart with S1 and S2 present and rhythmic, positive systolic murmur at the base, severe JVD, abdomen with no distention and positive lower extremity edema.   Na 121, K 5,0 Cl 81, glucose 291, bun 15 cr 0,50   Wbc 11,4 hgb 10,0 plt 351 Urine analysis with SG 1,011, 100 protein, large leukocytes, 11-20.   Head CT with no acute changes.   Chest radiograph with cardiomegaly with bilateral hilar vascular congestion, bilateral interstitial infiltrates more confluent left lower lobe  Follow up chest radiograph with persistent vascular congestion with worsening infiltrate at the left lower lobe.  Had opacity left lower lobe in the past, she is known to have large hiatal h hernia. Chest CT personally reviewed.   EKG 89 bpm, normal axis, normal intervals, sinus rhythm with no significant ST segment or T wave changes.   Patient was placed on furosemide and Bipap  04/19 Patient over night had persistent respiratory failure and hypotension,  shock physiology. Critical care was consulted and patient's daughter was updated.  Family decided to avoid ICU transfer and not perform aggressive invasive interventions, or vasopressors.    This morning patient continue unresponsive and hypotensive, she has bipap in place.  I spoke with her daughter POA, and she understands that her mother is having multiorgan failure with poor prognosis. She is aware that death is imminent despite the current aggressive non invasive therapies.  She is requesting no more blood work. We talk about changing to full comfort care today. She will call back on final decision about changing to full comfort care.   04/20 patient is more reactive today, but continue very weak and deconditioned. Her daughter has requested to continue IV antibiotics.   Assessment and Plan: Acute on chronic diastolic CHF (congestive heart failure) Echocardiogram with preserved LV systolic function EF 60 to 65%, moderate LVH, mild RV enlargement, RV systolic function with mild reduction, LA with severe dilatation, RA normal size, small pericardial effusion.  Severe aortic stenosis.   Continue volume overloaded. .   Plan to continue diuresis with furosemide 80 mg IV q12 hrs. Patient with poor prognosis, and end stage heart failure due to aortic stenosis. Will continue supportive medical therapy for now. Palliative care has been consulted.   Acute hypoxemic and hypercapnic respiratory failure due to cardiogenic pulmonary edema, possible pneumonia at the left lower lobe. Continue with antibiotic therapy.   Continue high flow nasal cannula and bipap for dyspnea control.   Patient with poor prognosis and high risk of worsening, her daughter confirms DNR and do  not intubate.   CKD (chronic kidney disease) stage 3, GFR 30-59 ml/min Hyponatremia.   Will check renal function in am. This will help in further prognostic assessment.  Patient needs continue diuresis for volume overload.    Essential hypertension Blood pressure 150 to 137 mmHg.   Pulmonary embolism Continue enoxaparin for anticoagulation.   Right shoulder prosthetic joint infection Patient will benefit from full comfort care.   Diabetes mellitus type 2, controlled Stop insulin therapy.   COPD (chronic obstructive pulmonary disease) Supportive bipap for now.   Acute metabolic encephalopathy Multifactorial encephalopathy, poor prognosis, Today she is more reactive, but still very deconditioned and ill looking appearing.   Class 1 obesity Calculated BMI is 31,7         Subjective: Patient very weak and deconditioned, her daughter is at the bedside. Patient answers simple questions.   Physical Exam: Vitals:   12/02/22 0921 12/02/22 1100 12/02/22 1416 12/02/22 1418  BP:  139/76    Pulse:  87    Resp:  (!) 33    Temp:      TempSrc:      SpO2: 98% 95% 99% 97%  Weight:      Height:       Neurology more awake, very weak and deconditioned, ill looking appearing ENT with pallor Cardiovascular with S1 and S2 present and rhythmic, positive systolic murmur at the base with no gallops Positive JVD Positive lower extremity edema Respiratory with bilateral rales with scattered rhonchi, on anterior auscultation  No abdominal distention  Data Reviewed:    Family Communication: I spoke with patient's daughter at the bedside, we talked in detail about patient's condition, plan of care and prognosis and all questions were addressed.   Disposition: Status is: Inpatient Remains inpatient appropriate because: poor prognosis, supportive medical therapy   Planned Discharge Destination: Home    Author: Coralie Keens, MD 12/02/2022 2:35 PM  For on call review [email or web address].

## 2022-12-02 NOTE — Progress Notes (Addendum)
Placed patient on BIPAP due to increased WOB, RR-40's, desaturation. Previous settings PC-12 Peep 5 40% FIO2,  Patient resting and breathing has resolved to RR-22. Patient appears more at rest.  [facility]FNC on stand-by.

## 2022-12-02 NOTE — Progress Notes (Signed)
Patient placed on the BiPAP for the night. Patient resting comfortably. Will continue to monitor.

## 2022-12-03 DIAGNOSIS — Z66 Do not resuscitate: Secondary | ICD-10-CM | POA: Diagnosis not present

## 2022-12-03 DIAGNOSIS — Z515 Encounter for palliative care: Secondary | ICD-10-CM

## 2022-12-03 DIAGNOSIS — Z7189 Other specified counseling: Secondary | ICD-10-CM

## 2022-12-03 DIAGNOSIS — E669 Obesity, unspecified: Secondary | ICD-10-CM

## 2022-12-03 DIAGNOSIS — Z9189 Other specified personal risk factors, not elsewhere classified: Secondary | ICD-10-CM

## 2022-12-03 DIAGNOSIS — I509 Heart failure, unspecified: Secondary | ICD-10-CM | POA: Diagnosis not present

## 2022-12-03 DIAGNOSIS — J449 Chronic obstructive pulmonary disease, unspecified: Secondary | ICD-10-CM

## 2022-12-03 DIAGNOSIS — G9341 Metabolic encephalopathy: Secondary | ICD-10-CM | POA: Diagnosis not present

## 2022-12-03 DIAGNOSIS — I5033 Acute on chronic diastolic (congestive) heart failure: Secondary | ICD-10-CM | POA: Diagnosis not present

## 2022-12-03 LAB — BASIC METABOLIC PANEL
Anion gap: 11 (ref 5–15)
BUN: 18 mg/dL (ref 8–23)
CO2: 39 mmol/L — ABNORMAL HIGH (ref 22–32)
Calcium: 8.8 mg/dL — ABNORMAL LOW (ref 8.9–10.3)
Chloride: 85 mmol/L — ABNORMAL LOW (ref 98–111)
Creatinine, Ser: 0.77 mg/dL (ref 0.44–1.00)
GFR, Estimated: >60 mL/min (ref >60)
Glucose, Bld: 157 mg/dL — ABNORMAL HIGH (ref 70–99)
Potassium: 2.8 mmol/L — ABNORMAL LOW (ref 3.5–5.1)
Sodium: 135 mmol/L (ref 135–145)

## 2022-12-03 LAB — GLUCOSE, CAPILLARY
Glucose-Capillary: 142 mg/dL — ABNORMAL HIGH (ref 70–99)
Glucose-Capillary: 150 mg/dL — ABNORMAL HIGH (ref 70–99)
Glucose-Capillary: 155 mg/dL — ABNORMAL HIGH (ref 70–99)
Glucose-Capillary: 222 mg/dL — ABNORMAL HIGH (ref 70–99)

## 2022-12-03 MED ORDER — IPRATROPIUM-ALBUTEROL 0.5-2.5 (3) MG/3ML IN SOLN
3.0000 mL | Freq: Two times a day (BID) | RESPIRATORY_TRACT | Status: DC
  Administered 2022-12-04 – 2022-12-08 (×8): 3 mL via RESPIRATORY_TRACT
  Filled 2022-12-03 (×8): qty 3

## 2022-12-03 MED ORDER — POTASSIUM CHLORIDE 10 MEQ/100ML IV SOLN
10.0000 meq | INTRAVENOUS | Status: AC
  Administered 2022-12-03 (×4): 10 meq via INTRAVENOUS
  Filled 2022-12-03 (×4): qty 100

## 2022-12-03 NOTE — Progress Notes (Signed)
Progress Note   Patient: Heidi Dominguez OZD:[id number] DOB: 1921/04/25 DOA: 11/29/2022     4 DOS: the patient was seen and examined on 12/03/2022   Brief hospital course: Mrs. Hegstrom was admitted to the hospital with the working diagnosis of heart failure exacerbation.   89 yo female with the past medical history of severe aortic stenosis, hyponatremia, T2DM, pulmonary embolism, asthma, large hiatal hernia, and obesity who presented with altered mentation. Most of the information from her daughter at the bedside, patient has been weak, and lethargic, with nausea, and vomiting that prompted her to bring her mother to the ED.  This is her 3rd hospitalization this year and 4th visit to the ED. Last hospitalization from 11/17/22 to 11/22/23, due to volume overload, and pulmonary embolism (acute small branch posterior right lobe). On her initial physical examination her blood pressure was 160/72, HR 88, RR 21 to 34, and 02 saturation 98%, lungs with rhonchi and rales, heart with S1 and S2 present and rhythmic, positive systolic murmur at the base, severe JVD, abdomen with no distention and positive lower extremity edema.   Na 121, K 5,0 Cl 81, glucose 291, bun 15 cr 0,50   Wbc 11,4 hgb 10,0 plt 351 Urine analysis with SG 1,011, 100 protein, large leukocytes, 11-20.   Head CT with no acute changes.   Chest radiograph with cardiomegaly with bilateral hilar vascular congestion, bilateral interstitial infiltrates more confluent left lower lobe  Follow up chest radiograph with persistent vascular congestion with worsening infiltrate at the left lower lobe.  Had opacity left lower lobe in the past, she is known to have large hiatal h hernia. Chest CT personally reviewed.   EKG 89 bpm, normal axis, normal intervals, sinus rhythm with no significant ST segment or T wave changes.   Patient was placed on furosemide and Bipap  04/19 Patient over night had persistent respiratory failure and hypotension,  shock physiology. Critical care was consulted and patient's daughter was updated.  Family decided to avoid ICU transfer and not perform aggressive invasive interventions, or vasopressors.    This morning patient continue unresponsive and hypotensive, she has bipap in place.  I spoke with her daughter POA, and she understands that her mother is having multiorgan failure with poor prognosis. She is aware that death is imminent despite the current aggressive non invasive therapies.  She is requesting no more blood work. We talk about changing to full comfort care today. She will call back on final decision about changing to full comfort care.   04/20 patient is more reactive today, but continue very weak and deconditioned. Her daughter has requested to continue IV antibiotics.   04/21: Verbally interacting with family; overall weak and deconditioned.  Seen by speech therapy with recommendation for full liquid diet.  Family in agreement to advance diet with good understanding regarding risk for aspiration.  Still with signs of fluid below on exam.  Assessment and Plan: Acute on chronic diastolic CHF (congestive heart failure) -Echocardiogram with preserved LV systolic function EF 60 to 65%, moderate LVH, mild RV enlargement, RV systolic function with mild reduction, LA with severe dilatation, RA normal size, small pericardial effusion.  Severe aortic stenosis.  -Still with signs of fluid overload on physical exam; continue IV diuresis -Continue to wean off oxygen supplementation as tolerated. -Continue close monitoring of patient's electrolytes, daily weights and strict intake and output. -Continue to wean off oxygen supplementation as tolerated. -Continue current IV antibiotics for aspiration coverage; flutter valve ordered. -Patient  with guarded/poor prognosis, and end stage heart failure due to aortic stenosis. -appreciate assistance and rec's by palliative care. Family wanting to continue  providing treatment as patient has demonstrated some stabilization (in comparison to her admission state); no intubation and not tube feedings.  CKD (chronic kidney disease) stage 3, GFR 30-59 ml/min -Hyponatremia, improved with diuresis -continue to follow renal function and electrolyte trend. -minimize nephrotoxic agents and avoid hypotension/contrast.    Essential hypertension -stable overall  -continue current antihypertensive agents   Pulmonary embolism -Continue enoxaparin for anticoagulation.   Diabetes mellitus type 2, controlled -currently not eating much -continue CBG monitoring, no insulin thersapy given risk for hypoglycemia currently. Cover for CBG's > 200  COPD (chronic obstructive pulmonary disease) -Supportive bipap for now.  -continue bronchodilator management.  Acute metabolic encephalopathy -Multifactorial  -prognosis is guarded. -continue current treatment and follow clinical response.   Class 1 obesity -Body mass index is 30.46 kg/m. -feeding supplements per nutritional service once proven oral tolerance.    Subjective: Patient is very weak and deconditioned; no fever, no nausea or vomiting reported.  Following simple commands and verbally interacting with family at bedside.  High flow nasal cannula supplementation in place.  Continue to use BiPAP at nighttime.  Physical Exam: Vitals:   12/03/22 1100 12/03/22 1200 12/03/22 1347 12/03/22 1500  BP: (!) 145/64 137/63  (!) 136/54  Pulse: 91 93  83  Resp: (!) 22 (!) 22  (!) 22  Temp:   98.9 F (37.2 C)   TempSrc:   Oral   SpO2: 100% 100%  100%  Weight:      Height:       General exam: Alert, awake, able to follow simple commands and verbally interacting with family. Respiratory system: Diffuse rhonchi bilaterally; requiring high flow nasal cannula supplementation and the use of BiPAP at night. Cardiovascular system: No rubs, no gallops, no JVD appreciated on exam. Gastrointestinal system: Abdomen  is nondistended, soft and nontender. No organomegaly or masses felt. Normal bowel sounds heard. Central nervous system: Generally weak; no new focal deficits. Extremities: No cyanosis or clubbing.  Trace to 1+ edema appreciated bilaterally. Skin: No petechiae.  Stage II right buttocks/sacral pressure injury present at time of admission.  No signs of superimposed infection. Psychiatry: Mood & affect flat, but appropriate.    Data Reviewed: Basic metabolic panel: Sodium 135, potassium 2.8, chloride 85, bicarb 39, BUN 18, creatinine 0.77 and GFR more than 60.  Creatinine clearance 39.3 mL/Min   Family Communication: I spoke with patient's daughter at the bedside (12/03/22)   Disposition: Status is: Inpatient Remains inpatient appropriate because: poor prognosis, supportive medical therapy    Planned Discharge Destination: Home  Author: Vassie Loll, MD 12/03/2022 3:57 PM  For on call review [email or web address].

## 2022-12-03 NOTE — Plan of Care (Signed)
Problem: Education: Goal: Ability to describe self-care measures that may prevent or decrease complications (Diabetes Survival Skills Education) will improve 12/03/2022 2058 by Dorma Russell, RN Outcome: Progressing 12/03/2022 2058 by Dorma Russell, RN Outcome: Progressing Goal: Individualized Educational Video(s) 12/03/2022 2058 by Dorma Russell, RN Outcome: Progressing 12/03/2022 2058 by Dorma Russell, RN Outcome: Progressing   Problem: Coping: Goal: Ability to adjust to condition or change in health will improve 12/03/2022 2058 by Dorma Russell, RN Outcome: Progressing 12/03/2022 2058 by Dorma Russell, RN Outcome: Progressing   Problem: Fluid Volume: Goal: Ability to maintain a balanced intake and output will improve 12/03/2022 2058 by Dorma Russell, RN Outcome: Progressing 12/03/2022 2058 by Dorma Russell, RN Outcome: Progressing   Problem: Health Behavior/Discharge Planning: Goal: Ability to identify and utilize available resources and services will improve 12/03/2022 2058 by Dorma Russell, RN Outcome: Progressing 12/03/2022 2058 by Dorma Russell, RN Outcome: Progressing Goal: Ability to manage health-related needs will improve 12/03/2022 2058 by Dorma Russell, RN Outcome: Progressing 12/03/2022 2058 by Dorma Russell, RN Outcome: Progressing   Problem: Metabolic: Goal: Ability to maintain appropriate glucose levels will improve 12/03/2022 2058 by Dorma Russell, RN Outcome: Progressing 12/03/2022 2058 by Dorma Russell, RN Outcome: Progressing   Problem: Nutritional: Goal: Maintenance of adequate nutrition will improve 12/03/2022 2058 by Dorma Russell, RN Outcome: Progressing 12/03/2022 2058 by Dorma Russell, RN Outcome: Progressing Goal: Progress toward achieving an optimal weight will improve 12/03/2022 2058 by Dorma Russell, RN Outcome: Progressing 12/03/2022 2058 by Dorma Russell, RN Outcome: Progressing   Problem: Skin  Integrity: Goal: Risk for impaired skin integrity will decrease 12/03/2022 2058 by Dorma Russell, RN Outcome: Progressing 12/03/2022 2058 by Dorma Russell, RN Outcome: Progressing   Problem: Tissue Perfusion: Goal: Adequacy of tissue perfusion will improve 12/03/2022 2058 by Dorma Russell, RN Outcome: Progressing 12/03/2022 2058 by Dorma Russell, RN Outcome: Progressing   Problem: Education: Goal: Knowledge of General Education information will improve Description: Including pain rating scale, medication(s)/side effects and non-pharmacologic comfort measures 12/03/2022 2058 by Dorma Russell, RN Outcome: Progressing 12/03/2022 2058 by Dorma Russell, RN Outcome: Progressing   Problem: Health Behavior/Discharge Planning: Goal: Ability to manage health-related needs will improve 12/03/2022 2058 by Dorma Russell, RN Outcome: Progressing 12/03/2022 2058 by Dorma Russell, RN Outcome: Progressing   Problem: Clinical Measurements: Goal: Ability to maintain clinical measurements within normal limits will improve 12/03/2022 2058 by Dorma Russell, RN Outcome: Progressing 12/03/2022 2058 by Dorma Russell, RN Outcome: Progressing Goal: Will remain free from infection 12/03/2022 2058 by Dorma Russell, RN Outcome: Progressing 12/03/2022 2058 by Dorma Russell, RN Outcome: Progressing Goal: Diagnostic test results will improve 12/03/2022 2058 by Dorma Russell, RN Outcome: Progressing 12/03/2022 2058 by Dorma Russell, RN Outcome: Progressing Goal: Respiratory complications will improve 12/03/2022 2058 by Dorma Russell, RN Outcome: Progressing 12/03/2022 2058 by Dorma Russell, RN Outcome: Progressing Goal: Cardiovascular complication will be avoided 12/03/2022 2058 by Dorma Russell, RN Outcome: Progressing 12/03/2022 2058 by Dorma Russell, RN Outcome: Progressing   Problem: Activity: Goal: Risk for activity intolerance will decrease 12/03/2022 2058 by Dorma Russell, RN Outcome: Progressing 12/03/2022 2058 by Dorma Russell, RN Outcome: Progressing   Problem: Nutrition: Goal: Adequate nutrition will be maintained 12/03/2022 2058 by Dorma Russell, RN Outcome: Progressing 12/03/2022 2058 by Dorma Russell, RN Outcome: Progressing  Problem: Coping: Goal: Level of anxiety will decrease 12/03/2022 2058 by Dorma Russell, RN Outcome: Progressing 12/03/2022 2058 by Dorma Russell, RN Outcome: Progressing   Problem: Elimination: Goal: Will not experience complications related to bowel motility 12/03/2022 2058 by Dorma Russell, RN Outcome: Progressing 12/03/2022 2058 by Dorma Russell, RN Outcome: Progressing Goal: Will not experience complications related to urinary retention 12/03/2022 2058 by Dorma Russell, RN Outcome: Progressing 12/03/2022 2058 by Dorma Russell, RN Outcome: Progressing   Problem: Pain Managment: Goal: General experience of comfort will improve 12/03/2022 2058 by Dorma Russell, RN Outcome: Progressing 12/03/2022 2058 by Dorma Russell, RN Outcome: Progressing   Problem: Safety: Goal: Ability to remain free from injury will improve 12/03/2022 2058 by Dorma Russell, RN Outcome: Progressing 12/03/2022 2058 by Dorma Russell, RN Outcome: Progressing   Problem: Skin Integrity: Goal: Risk for impaired skin integrity will decrease 12/03/2022 2058 by Dorma Russell, RN Outcome: Progressing 12/03/2022 2058 by Dorma Russell, RN Outcome: Progressing

## 2022-12-03 NOTE — Progress Notes (Signed)
Speech Language Pathology Treatment: Dysphagia  Patient Details Name: OAKLYN JAKUBEK MRN: [id number] DOB: [birth year] Today's Date: 12/03/2022 Time: 1650-1710 SLP Time Calculation (min) (ACUTE ONLY): 20 min  Assessment / Plan / Recommendation Clinical Impression  Patient seen by SLP for skilled treatment focused on dysphagia goals. Her grandson was present in room as well. Patient was awake and alert and voice is stronger today than yesterday's session. RR varied from low 20's to low 30's throughout session, with no apparent correlation between RR and PO intake. Patient requested apple juice and she then drank juice via straw sips, exhibiting a timely swallow initiation and no overt s/s aspiration or penetration. She drank total of 8 ounces of apple juice then requested prune juice, drinking approximately 2-3 ounces. No overt s/s aspiration during or after PO intake. SLP recommending to continue on current diet with precautions secondary to respiratory deficits. SLP will follow briefly for toleration and family education as needed.    HPI HPI: 89 y.o. female known to SLP services and evaluated for dysphagia on 2/16, 3/15, and 4/8.  Presented to ED with N/V and progressive weakness and lethargy. Concerns for her severe aortic stenosis, decompensated HF, respiratory distress and high risk for worsening.  PMHx significant for chronic hyponatremia, HFpEF, severe aortic stenosis, esophageal dysphagia, asthma, normocytic anemia, T2DM, GERD with large hiatal hernia and recurrent nausea and vomiting, HTN, HLD, recent admission for right shoulder PJI, and PE. Patient has had several recurrent admissions since February 2024.  Each with different diagnoses including RSV infection, right shoulder shoulder PJI, pulmonary embolism, each admission with underlying hyponatremia. She has a hx of eosphageal dysphagia. Most recent MBS 07/20/22: oral and oropharyngeal function WNL. Esophagram on 11/12/22: "Focal narrowing of the  distal esophagus compatible with  stricture, similar to the prior upper GI series 06/20/2017. It is estimated that the esophagus is narrowed to 5-7 mm at this site.  3. Mildly patulous esophagus elsewhere.  4. Prominent esophageal dysmotility with tertiary contractions."  A regular diet, thin liquids have been recommended after each recent evaluation. Pt has been independent with esophageal precautions. Patient evaluated at bedside  on 11/30/22 and as she was obtunded and unable to manage secretions or participate in PO trials. SLP service ordered again on 4/20 for re-evaluation as daughter wants to see if there is any PO patient could safely consume.      SLP Plan  Continue with current plan of care      Recommendations for follow up therapy are one component of a multi-disciplinary discharge planning process, led by the attending physician.  Recommendations may be updated based on patient status, additional functional criteria and insurance authorization.    Recommendations  Diet recommendations: Thin liquid Liquids provided via: Straw Medication Administration: Crushed with puree Supervision: Full supervision/cueing for compensatory strategies;Trained caregiver to feed patient;Staff to assist with self feeding Compensations: Slow rate;Small sips/bites Postural Changes and/or Swallow Maneuvers: Seated upright 90 degrees                  Oral care BID;Staff/trained caregiver to provide oral care   Frequent or constant Supervision/Assistance Dysphagia, unspecified (R13.10)     Continue with current plan of care     Angela Nevin, MA, CCC-SLP Speech Therapy

## 2022-12-03 NOTE — Plan of Care (Signed)

## 2022-12-03 NOTE — Progress Notes (Signed)
Palliative Medicine Progress Note   Patient Name: Heidi Dominguez       Date: 12/03/2022 DOB: 12-30-1920  Age: 89 y.o. MRN#: [id number] Attending Physician: Vassie Loll, MD Primary Care Physician: Merlene Laughter, MD Admit Date: 11/29/2022  Reason for Consultation/Follow-up: {Reason for Consult:23484}  HPI/Patient Profile: 89 y.o. female  with past medical history of HFpEF, severe aortic stenosis, T2DM, pulmonary embolism, asthma, large hiatal hernia, and hyponatremia who presented to [facility] ED on 11/29/2022 with altered mental status.  She was admitted with acute on chronic diastolic CHF, volume overload, and acute respiratory failure. Palliative Medicine was consulted for goals of care.    4/19 -patient had persistent respiratory failure and hypotension likely due to shock.  Family declined transfer to ICU and vasopressors.   4/20 - patient is more reactive, but continues to be very weak. She showed signs of respiratory distress after eating and is now NPO.  Subjective:   Objective:  Physical Exam          Vital Signs: BP 137/63   Pulse 93   Temp 98.9 F (37.2 C) (Oral)   Resp (!) 22   Ht  (1.626 m)   Wt 80.5 kg   SpO2 100%   BMI 30.46 kg/m  SpO2: SpO2: 100 % O2 Device: O2 Device: High Flow Nasal Cannula O2 Flow Rate: O2 Flow Rate (L/min): 50 L/min  Intake/output summary:  Intake/Output Summary (Last 24 hours) at 12/03/2022 1521 Last data filed at 12/03/2022 1405 Gross per 24 hour  Intake 700 ml  Output 1050 ml  Net -350 ml    LBM: Last BM Date : 12/03/22     Palliative Assessment/Data: ***     Palliative Medicine Assessment & Plan   Assessment: Active Problems:   Essential hypertension   Diabetes mellitus type 2, controlled   CKD (chronic kidney disease)  stage 3, GFR 30-59 ml/min   COPD (chronic obstructive pulmonary disease)   Right shoulder prosthetic joint infection   Acute on chronic diastolic CHF (congestive heart failure)   Pulmonary embolism   Acute on chronic congestive heart failure   Acute metabolic encephalopathy   Class 1 obesity   Acute respiratory failure with hypoxia and hypercapnia   DNR (do not resuscitate)    Recommendations/Plan: ***  Goals of Care and Additional Recommendations: Limitations on  Scope of Treatment: {Recommended Scope and Preferences:21019}  Code Status:   Prognosis:  {Palliative Care Prognosis:23504}  Discharge Planning: {Palliative dispostion:23505}  Care plan was discussed with ***  Thank you for allowing the Palliative Medicine Team to assist in the care of this patient.   ***   Merry Proud, NP   Please contact Palliative Medicine Team phone at [phone] for questions and concerns.  For individual providers, please see AMION.

## 2022-12-03 NOTE — Plan of Care (Addendum)
Pt on BIPAP all night and was able to sleep without incident. Niece at the bedside overnight.  Continue on IV ABX. Potassium low this AM, to get replacement this AM.  No other concerns.  Problem: Education: Goal: Ability to describe self-care measures that may prevent or decrease complications (Diabetes Survival Skills Education) will improve Outcome: Progressing Goal: Individualized Educational Video(s) Outcome: Progressing   Problem: Coping: Goal: Ability to adjust to condition or change in health will improve Outcome: Progressing   Problem: Fluid Volume: Goal: Ability to maintain a balanced intake and output will improve Outcome: Progressing   Problem: Health Behavior/Discharge Planning: Goal: Ability to identify and utilize available resources and services will improve Outcome: Progressing Goal: Ability to manage health-related needs will improve Outcome: Progressing   Problem: Metabolic: Goal: Ability to maintain appropriate glucose levels will improve Outcome: Progressing   Problem: Nutritional: Goal: Maintenance of adequate nutrition will improve Outcome: Progressing Goal: Progress toward achieving an optimal weight will improve Outcome: Progressing   Problem: Skin Integrity: Goal: Risk for impaired skin integrity will decrease Outcome: Progressing   Problem: Tissue Perfusion: Goal: Adequacy of tissue perfusion will improve Outcome: Progressing   Problem: Education: Goal: Knowledge of General Education information will improve Description: Including pain rating scale, medication(s)/side effects and non-pharmacologic comfort measures Outcome: Progressing   Problem: Health Behavior/Discharge Planning: Goal: Ability to manage health-related needs will improve Outcome: Progressing   Problem: Clinical Measurements: Goal: Ability to maintain clinical measurements within normal limits will improve Outcome: Progressing Goal: Will remain free from  infection Outcome: Progressing Goal: Diagnostic test results will improve Outcome: Progressing Goal: Respiratory complications will improve Outcome: Progressing Goal: Cardiovascular complication will be avoided Outcome: Progressing   Problem: Activity: Goal: Risk for activity intolerance will decrease Outcome: Progressing   Problem: Nutrition: Goal: Adequate nutrition will be maintained Outcome: Progressing   Problem: Coping: Goal: Level of anxiety will decrease Outcome: Progressing   Problem: Elimination: Goal: Will not experience complications related to bowel motility Outcome: Progressing Goal: Will not experience complications related to urinary retention Outcome: Progressing   Problem: Pain Managment: Goal: General experience of comfort will improve Outcome: Progressing   Problem: Safety: Goal: Ability to remain free from injury will improve Outcome: Progressing   Problem: Skin Integrity: Goal: Risk for impaired skin integrity will decrease Outcome: Progressing

## 2022-12-03 NOTE — Progress Notes (Addendum)
Pharmacy Antibiotic Note  Heidi Dominguez is a 89 y.o. female admitted on 11/29/2022 with aspiration pneumonia.  Pharmacy has been consulted for Unasyn dosing. WBC up 20.1, afebrile. Scr 0.77.   Plan: Continue Unasyn 3g IV q8h  Continue to monitor renal function, s/sx improvement, LOT, ability to transition back to cefadroxil  Height:  (162.6 cm) Weight: 80.5 kg (177 lb 7.5 oz) IBW/kg (Calculated) : 54.7  Temp (24hrs), Avg:98.7 F (37.1 C), Min:98.5 F (36.9 C), Max:98.9 F (37.2 C)  Recent Labs  Lab 11/29/22 1533 11/29/22 1638 11/29/22 2214 11/30/22 0421 12/01/22 0632 12/03/22 0102  WBC 11.4*  --   --  10.9* 20.1*  --   CREATININE  --  0.50 0.59 0.59 0.71 0.77     Estimated Creatinine Clearance: 39.3 mL/min (by C-G formula based on SCr of 0.77 mg/dL).    Allergies  Allergen Reactions   Augmentin [Amoxicillin-Pot Clavulanate] Diarrhea   Bactrim [Sulfamethoxazole-Trimethoprim] Diarrhea and Itching   Clonidine Hcl Other (See Comments)    Unknown reaction  Not documented on MAR  Other Reaction(s): GI Intolerance, Other (See Comments)  Unknown., Unknown., Unknown.   Codeine Other (See Comments)    "Makes me out of my head, loopy"   Hydrocodone Other (See Comments)    "makes me go out of my head, loopy"   Other Other (See Comments)    Any type of narcotics Feels "loopy"    Sulfa Antibiotics Itching   Sulfamethoxazole Diarrhea and Itching    Not documented on MAR  Other Reaction(s): Diarrhea   Verapamil Diarrhea    Other Reaction(s): Diarrhea, GI Intolerance, Other (See Comments)   Zestril [Lisinopril] Cough   Azulfidine [Sulfasalazine] Itching    Antimicrobials this admission: Cefadroxil 4/18 x1  Unasyn 4/18>   Dose adjustments this admission:   Microbiology results: 4/19 Sputum Cx: ordered 4/17 Ucx: multiple species  4/6 RVP: negative   Thank you for allowing pharmacy to be a part of this patient's care.  Larena Sox, PharmD PGY1  Pharmacy Resident   12/03/2022  2:23 PM

## 2022-12-04 ENCOUNTER — Inpatient Hospital Stay: Payer: Medicare PPO | Admitting: Internal Medicine

## 2022-12-04 DIAGNOSIS — J9601 Acute respiratory failure with hypoxia: Secondary | ICD-10-CM | POA: Diagnosis not present

## 2022-12-04 DIAGNOSIS — N1832 Chronic kidney disease, stage 3b: Secondary | ICD-10-CM | POA: Diagnosis not present

## 2022-12-04 DIAGNOSIS — I2782 Chronic pulmonary embolism: Secondary | ICD-10-CM | POA: Diagnosis not present

## 2022-12-04 DIAGNOSIS — Z515 Encounter for palliative care: Secondary | ICD-10-CM | POA: Diagnosis not present

## 2022-12-04 DIAGNOSIS — I509 Heart failure, unspecified: Secondary | ICD-10-CM | POA: Diagnosis not present

## 2022-12-04 DIAGNOSIS — I5033 Acute on chronic diastolic (congestive) heart failure: Secondary | ICD-10-CM | POA: Diagnosis not present

## 2022-12-04 DIAGNOSIS — I10 Essential (primary) hypertension: Secondary | ICD-10-CM | POA: Diagnosis not present

## 2022-12-04 DIAGNOSIS — Z66 Do not resuscitate: Secondary | ICD-10-CM | POA: Diagnosis not present

## 2022-12-04 LAB — GLUCOSE, CAPILLARY
Glucose-Capillary: 161 mg/dL — ABNORMAL HIGH (ref 70–99)
Glucose-Capillary: 167 mg/dL — ABNORMAL HIGH (ref 70–99)
Glucose-Capillary: 206 mg/dL — ABNORMAL HIGH (ref 70–99)

## 2022-12-04 NOTE — Progress Notes (Incomplete)
Palliative Medicine Progress Note   Patient Name: Heidi Dominguez       Date: 12/04/2022 DOB: [birth year]  Age: 89 y.o. MRN#: [id number] Attending Physician: Coralie Keens Primary Care Physician: Merlene Laughter, MD Admit Date: 11/29/2022  Reason for Consultation/Follow-up: {Reason for Consult:23484}  HPI/Patient Profile: 89 y.o. female  with past medical history of HFpEF, severe aortic stenosis, T2DM, pulmonary embolism, asthma, large hiatal hernia, and hyponatremia who presented to [facility] ED on 11/29/2022 with altered mental status.  She was admitted with acute on chronic diastolic CHF, volume overload, and acute respiratory failure. Palliative Medicine was consulted for goals of care.    4/19 -patient had persistent respiratory failure and hypotension likely due to shock.  Family declined transfer to ICU and vasopressors.   4/20 - patient is more reactive, but continues to be very weak. She showed signs of respiratory distress after eating and is now NPO.  Subjective: Chart reviewed and patient assessed at bedside. She is on 40 liters HFNC. She is currently resting/sleeping and I did not attempt to awaken her.     Objective:  Physical Exam          Vital Signs: BP (!) 143/60 (BP Location: Right Arm)   Pulse 87   Temp 98 F (36.7 C)   Resp (!) 34   Ht  (1.626 m)   Wt 79 kg   SpO2 98%   BMI 29.90 kg/m  SpO2: SpO2: 98 % O2 Device: O2 Device: High Flow Nasal Cannula O2 Flow Rate: O2 Flow Rate (L/min): (S) 40 L/min    LBM: Last BM Date : 12/03/22     Palliative Assessment/Data: ***     Palliative Medicine Assessment & Plan   Assessment: Active Problems:   Essential hypertension   Diabetes mellitus type 2, controlled   CKD (chronic kidney disease) stage 3,  GFR 30-59 ml/min   COPD (chronic obstructive pulmonary disease)   Right shoulder prosthetic joint infection   Acute on chronic diastolic CHF (congestive heart failure)   Pulmonary embolism   Acute on chronic congestive heart failure   Acute metabolic encephalopathy   Class 1 obesity   Acute respiratory failure with hypoxia and hypercapnia   DNR (do not resuscitate)    Recommendations/Plan: DNR/DNI Continue current supportive interventions, but avoid any escalation of care Appreciate  SLP recommendations  Goals of Care and Additional Recommendations: Limitations on Scope of Treatment: {Recommended Scope and Preferences:21019}  Code Status:   Prognosis:  {Palliative Care Prognosis:23504}  Discharge Planning: {Palliative dispostion:23505}  Care plan was discussed with ***  Thank you for allowing the Palliative Medicine Team to assist in the care of this patient.   ***   Merry Proud, NP   Please contact Palliative Medicine Team phone at [phone] for questions and concerns.  For individual providers, please see AMION.

## 2022-12-04 NOTE — Progress Notes (Signed)
Progress Note   Patient: Heidi Dominguez ZOX:[id number] DOB: [birth year] DOA: 11/29/2022     5 DOS: the patient was seen and examined on 12/04/2022   Brief hospital course: Mrs. Schupp was admitted to the hospital with the working diagnosis of heart failure exacerbation.   89 yo female with the past medical history of severe aortic stenosis, hyponatremia, T2DM, pulmonary embolism, asthma, large hiatal hernia, and obesity who presented with altered mentation. Most of the information from her daughter at the bedside, patient has been weak, and lethargic, with nausea, and vomiting that prompted her to bring her mother to the ED.  This is her 3rd hospitalization this year and 4th visit to the ED. Last hospitalization from 11/17/22 to 11/22/23, due to volume overload, and pulmonary embolism (acute small branch posterior right lobe). On her initial physical examination her blood pressure was 160/72, HR 88, RR 21 to 34, and 02 saturation 98%, lungs with rhonchi and rales, heart with S1 and S2 present and rhythmic, positive systolic murmur at the base, severe JVD, abdomen with no distention and positive lower extremity edema.   Na 121, K 5,0 Cl 81, glucose 291, bun 15 cr 0,50   Wbc 11,4 hgb 10,0 plt 351 Urine analysis with SG 1,011, 100 protein, large leukocytes, 11-20.   Head CT with no acute changes.   Chest radiograph with cardiomegaly with bilateral hilar vascular congestion, bilateral interstitial infiltrates more confluent left lower lobe  Follow up chest radiograph with persistent vascular congestion with worsening infiltrate at the left lower lobe.  Had opacity left lower lobe in the past, she is known to have large hiatal h hernia. Chest CT personally reviewed.   EKG 89 bpm, normal axis, normal intervals, sinus rhythm with no significant ST segment or T wave changes.   Patient was placed on furosemide and Bipap  04/19 Patient over night had persistent respiratory failure and hypotension,  shock physiology. Critical care was consulted and patient's daughter was updated.  Family decided to avoid ICU transfer and not perform aggressive invasive interventions, or vasopressors.    This morning patient continue unresponsive and hypotensive, she has bipap in place.  I spoke with her daughter POA, and she understands that her mother is having multiorgan failure with poor prognosis. She is aware that death is imminent despite the current aggressive non invasive therapies.  She is requesting no more blood work. We talk about changing to full comfort care today. She will call back on final decision about changing to full comfort care.   04/20 patient is more reactive today, but continue very weak and deconditioned. Her daughter has requested to continue IV antibiotics.   04/21: Verbally interacting with family; overall weak and deconditioned.  Seen by speech therapy with recommendation for full liquid diet.  Family in agreement to advance diet with good understanding regarding risk for aspiration.  Still with signs of fluid below on exam. 04/22 patient very weak and deconditioned, opens her eyes to light touch, not interactive.   Assessment and Plan: Acute on chronic diastolic CHF (congestive heart failure) -Echocardiogram with preserved LV systolic function EF 60 to 65%, moderate LVH, mild RV enlargement, RV systolic function with mild reduction, LA with severe dilatation, RA normal size, small pericardial effusion.  Severe aortic stenosis.  End stage heart failure.  Continue diuresis with 80 mg furosemide q12 hrs  -Continue current IV antibiotics for aspiration coverage pneumonia  -Patient with guarded/poor prognosis, and end stage heart failure due to aortic stenosis. -appreciate  assistance and rec's by palliative care. Family wanting to continue providing treatment as patient has demonstrated some stabilization (in comparison to her admission state); no intubation and not tube  feedings.  Acute hypoxemic and hypercapnic respiratory failure. Patient on heated high flow nasal cannula and intermittent Bipap. Positive tachypnea and increased work of breathing.  Currently on 40 L per min, 02 saturation 95%  CKD (chronic kidney disease) stage 3, GFR 30-59 ml/min -Hyponatremia, improved with diuresis Hypokalemia,   Limited blood work, patient with poor prognosis.    Essential hypertension Systolic blood pressure 143 mmHg.    Pulmonary embolism -Continue enoxaparin for anticoagulation.   Diabetes mellitus type 2, controlled -currently not eating much -continue CBG monitoring, no insulin thersapy given risk for hypoglycemia currently. Cover for CBG's > 200  COPD (chronic obstructive pulmonary disease) -Supportive bipap for now.  -continue bronchodilator management.  Acute metabolic encephalopathy -Multifactorial  -prognosis is guarded. -continue current treatment and follow clinical response.   Class 1 obesity -Body mass index is 30.46 kg/m. -feeding supplements per nutritional service once proven oral tolerance.  Pressure skin ulcers present on admission.       Subjective: patient continue very weak and deconditioned, poorly responsive.   Physical Exam: Vitals:   12/04/22 0313 12/04/22 0400 12/04/22 0700 12/04/22 1100  BP: (!) 142/59 (!) 150/60 (!) 145/55 (!) 143/60  Pulse: 84 87 85 87  Resp: (!) 34 20 (!) 22 (!) 34  Temp:  98.2 F (36.8 C) 98.4 F (36.9 C) 98 F (36.7 C)  TempSrc:  Axillary Oral   SpO2: 100% 99% 99% 98%  Weight:  79 kg    Height:       Neurology patient very weak and deconditioned she opens her eyes briefly to voice but not follows commands or interacts. Not answering questions. ENT with positive pallor Cardiovascular with S1 and S2 present and regular with positive murmur systolic at the base with no gallops. Mild JVD Lower extremity edema + Respiratory with no rales or wheezing, poor inspiratory effort with  rales at bases  Abdomen with no distention  Data Reviewed:    Family Communication: no family but a close friend at the bedside   Disposition: Status is: Inpatient Remains inpatient appropriate because: medical therapy/ comfort measures   Planned Discharge Destination: Home    Author: Coralie Keens, MD 12/04/2022 1:57 PM  For on call review [email or web address].

## 2022-12-04 NOTE — Progress Notes (Signed)
   12/04/22 1100  Assess: MEWS Score  Temp 98 F (36.7 C)  BP (!) 143/60  MAP (mmHg) 84  Pulse Rate 87  ECG Heart Rate 87  Resp (!) 34  Level of Consciousness Alert  SpO2 98 %  O2 Device HFNC  Assess: MEWS Score  MEWS Temp 0  MEWS Systolic 0  MEWS Pulse 0  MEWS RR 2  MEWS LOC 0  MEWS Score 2  MEWS Score Color Yellow  Assess: if the MEWS score is Yellow or Red  Were vital signs taken at a resting state? Yes  Focused Assessment No change from prior assessment  Does the patient meet 2 or more of the SIRS criteria? No  Does the patient have a confirmed or suspected source of infection? No  Provider and Rapid Response Notified? No  MEWS guidelines implemented  Yes, yellow  Treat  MEWS Interventions Considered administering scheduled or prn medications/treatments as ordered  Take Vital Signs  Increase Vital Sign Frequency  Yellow: Q2hr x1, continue Q4hrs until patient remains green for 12hrs  Escalate  MEWS: Escalate Yellow: Discuss with charge nurse and consider notifying provider and/or RRT  Notify: Charge Nurse/RN  Name of Charge Nurse/RN Notified Londra  Assess: SIRS CRITERIA  SIRS Temperature  0  SIRS Pulse 0  SIRS Respirations  1  SIRS WBC 0  SIRS Score Sum  1

## 2022-12-05 DIAGNOSIS — I2782 Chronic pulmonary embolism: Secondary | ICD-10-CM | POA: Diagnosis not present

## 2022-12-05 DIAGNOSIS — N1832 Chronic kidney disease, stage 3b: Secondary | ICD-10-CM | POA: Diagnosis not present

## 2022-12-05 DIAGNOSIS — I5033 Acute on chronic diastolic (congestive) heart failure: Secondary | ICD-10-CM | POA: Diagnosis not present

## 2022-12-05 DIAGNOSIS — E1165 Type 2 diabetes mellitus with hyperglycemia: Secondary | ICD-10-CM | POA: Diagnosis not present

## 2022-12-05 LAB — BASIC METABOLIC PANEL
BUN: 6 mg/dL — ABNORMAL LOW (ref 8–23)
CO2: >45 mmol/L — ABNORMAL HIGH (ref 22–32)
Calcium: 8.9 mg/dL (ref 8.9–10.3)
Chloride: 81 mmol/L — ABNORMAL LOW (ref 98–111)
Creatinine, Ser: 0.6 mg/dL (ref 0.44–1.00)
GFR, Estimated: >60 mL/min (ref >60)
Glucose, Bld: 172 mg/dL — ABNORMAL HIGH (ref 70–99)
Potassium: 2.5 mmol/L — CL (ref 3.5–5.1)
Sodium: 140 mmol/L (ref 135–145)

## 2022-12-05 MED ORDER — POTASSIUM CHLORIDE 10 MEQ/100ML IV SOLN
10.0000 meq | INTRAVENOUS | Status: AC
  Administered 2022-12-05 (×6): 10 meq via INTRAVENOUS
  Filled 2022-12-05 (×6): qty 100

## 2022-12-05 MED ORDER — ENSURE ENLIVE PO LIQD
237.0000 mL | Freq: Two times a day (BID) | ORAL | Status: DC
  Administered 2022-12-05 – 2022-12-08 (×5): 237 mL via ORAL

## 2022-12-05 MED ORDER — PROSOURCE PLUS PO LIQD
30.0000 mL | Freq: Three times a day (TID) | ORAL | Status: DC
  Administered 2022-12-05 – 2022-12-08 (×6): 30 mL via ORAL
  Filled 2022-12-05 (×7): qty 30

## 2022-12-05 NOTE — Progress Notes (Signed)
PROGRESS NOTE    Heidi Dominguez  WGN:[id number] DOB: 1921/03/30 DOA: 12/10/2022 PCP: Merlene Laughter, MD  89/F w Severe aortic stenosis, hyponatremia, T2DM, pulmonary embolism, asthma, large hiatal hernia, and obesity who presented with altered mentation, weakness, lethargy, nausea and vomiting.  This is her 3rd hospitalization this year and 4th visit to the ED. Last hospitalization from 4/5-4/10 with volume overload and pulmonary embolism . -In the ER she was tachypneic, and volume overloaded, labs noted creatinine of 0.5, white count 11.4, abnormal UA. -CT head unremarkable, chest x-ray with cardiomegaly pulmonary vascular congestion and bilateral interstitial infiltrates. -Fu CXR with persistent vascular congestion with worsening infiltrate at the left lower lobe.  H - placed on furosemide and Bipap -4/19 Patient over night had persistent respiratory failure and hypotension, shock physiology. Critical care was consulted and patient's daughter was updated.  Family decided to avoid ICU transfer and not perform aggressive invasive interventions, or vasopressors.  -unresponsive and hypotensive, she has bipap in place. -MD d/w daughter POA, re: multiorgan failure with poor prognosis. -4/20 patient is more reactive today, but continue very weak and deconditioned. Her daughter has requested to continue IV antibiotics.  -4/21: Verbally interacting with family; overall weak and deconditioned.  Seen by speech therapy with recommendation for full liquid diet.  Family in agreement to advance diet with good understanding regarding risk for aspiration.  Still with signs of fluid  -4/22 patient very weak and deconditioned, opens her eyes to light touch, not interactive.  -4/23 patient with decreased 02 requirements, today down to 8 L.min per HFNC.  -Dr.Arrien d.w daughter (POA) and her son at the bedside.  Plan is to continue medical therapy during her hospitalization and discharge to home with home  hospice vs residential hospice.    Subjective:   Assessment and Plan:  Acute on chronic diastolic CHF Severe Aortic stenosis -Echo EF 60 to 65%, moderate LVH, mild RV enlargement, RV systolic function with mild reduction, LA with severe dilatation, Severe aortic stenosis.  End stage heart failure. -continued on IV furosemide as needed during her hospitalization. Patient with hypokalemia.  Continue very weak and deconditioned, not able to tolerate po due to high aspiration risk.   Continue current IV antibiotics for aspiration coverage pneumonia, complete course of 5 days with Unasyn.   Very poor prognosis. Plan to continue medical therapy during her hospitalization and transition to home with hospice or residential hospice.   Acute hypoxemic and hypercapnic respiratory failure. -heated high flow nasal cannula and intermittent Bipap. -now 8 L/min per HFNC. Very poor prognosis.  Patient with high risk of aspiration.   CKD (chronic kidney disease) stage 3, GFR 30-59 ml/min -Hyponatremia, improved with diuresis Hypokalemia,  -lasix held today  Pulmonary embolism -Continue enoxaparin for anticoagulation.   Diabetes mellitus type 2, controlled Holding on insulin therapy and check capillary glucose only as needed.  Patient with very poor oral intake.  Fasting glucose today 172   COPD (chronic obstructive pulmonary disease) -Supportive bipap for now.  -continue bronchodilator management.  Acute metabolic encephalopathy -Multifactorial  -prognosis is guarded. -continue current treatment and follow clinical response.   Class 1 obesity -Body mass index is 30.46 kg/m. -feeding supplements per nutritional service once proven oral tolerance.   DVT prophylaxis: lovenox Code Status: DNR Family Communication: Disposition Plan:   Consultants:    Procedures:   Antimicrobials:    Objective: Vitals:   12/05/22 0950 12/05/22 1039 12/05/22 1045 12/05/22 1340  BP:       Pulse:  86  Resp:    20  Temp:      TempSrc:      SpO2: 100% 100% 100% 100%  Weight:      Height:        Intake/Output Summary (Last 24 hours) at 12/05/2022 1422 Last data filed at 12/05/2022 1300 Gross per 24 hour  Intake 612.76 ml  Output 1125 ml  Net -512.24 ml   Filed Weights   12/04/22 0400 12/05/22 0000 12/05/22 0500  Weight: 79 kg 75.2 kg 75.2 kg    Examination:      Data Reviewed:   CBC: Recent Labs  Lab 11/16/2022 1533 11/26/2022 1638 11/30/22 0421 12/01/22 0632  WBC 11.4*  --  10.9* 20.1*  NEUTROABS 9.4*  --  8.9* 19.3*  HGB 10.0* 12.2 9.1* 9.6*  HCT 31.7* 36.0 30.7* 32.3*  MCV 86.8  --  89.2 90.7  PLT 351  --  348 171   Basic Metabolic Panel: Recent Labs  Lab 11/15/2022 2214 11/30/22 0421 12/01/22 0632 12/03/22 0102 12/05/22 0417  NA 123* 125* 129* 135 140  K 3.7 4.3 3.8 2.8* 2.5*  CL 80* 82* 83* 85* 81*  CO2 34* 35* 32 39* >45*  GLUCOSE 240* 162* 161* 157* 172*  BUN 6*  CREATININE 0.59 0.59 0.71 0.77 0.60  CALCIUM 9.2 9.0 9.3 8.8* 8.9  MG  --   --  2.0  --   --   PHOS  --   --  4.9*  --   --    GFR: Estimated Creatinine Clearance: 38.1 mL/min (by C-G formula based on SCr of 0.6 mg/dL). Liver Function Tests: Recent Labs  Lab 11/28/2022 2214 12/01/22 0632  AST 15 21  ALT 13 19  ALKPHOS 78 71  BILITOT 0.5 0.5  PROT 6.9 6.8  ALBUMIN 2.2* 2.6*   No results for input(s): "LIPASE", "AMYLASE" in the last 168 hours. No results for input(s): "AMMONIA" in the last 168 hours. Coagulation Profile: No results for input(s): "INR", "PROTIME" in the last 168 hours. Cardiac Enzymes: No results for input(s): "CKTOTAL", "CKMB", "CKMBINDEX", "TROPONINI" in the last 168 hours. BNP (last 3 results) Recent Labs    09/27/22 1218  PROBNP 77.0   HbA1C: No results for input(s): "HGBA1C" in the last 72 hours. CBG: Recent Labs  Lab 12/03/22 1050 12/03/22 1754 12/03/22 2358 12/04/22 0610 12/04/22 1126  GLUCAP 142* 222* 206* 161*  167*   Lipid Profile: No results for input(s): "CHOL", "HDL", "LDLCALC", "TRIG", "CHOLHDL", "LDLDIRECT" in the last 72 hours. Thyroid Function Tests: No results for input(s): "TSH", "T4TOTAL", "FREET4", "T3FREE", "THYROIDAB" in the last 72 hours. Anemia Panel: No results for input(s): "VITAMINB12", "FOLATE", "FERRITIN", "TIBC", "IRON", "RETICCTPCT" in the last 72 hours. Urine analysis:    Component Value Date/Time   COLORURINE YELLOW 11/23/2022 2211   APPEARANCEUR HAZY (A) 12/07/2022 2211   LABSPEC 1.011 12/06/2022 2211   PHURINE 6.0 11/26/2022 2211   GLUCOSEU NEGATIVE 11/19/2022 2211   HGBUR SMALL (A) 12/04/2022 2211   BILIRUBINUR NEGATIVE 11/17/2022 2211   KETONESUR NEGATIVE 11/16/2022 2211   PROTEINUR 100 (A) 12/03/2022 2211   UROBILINOGEN 1.0 04/07/2015 2312   NITRITE NEGATIVE 12/12/2022 2211   LEUKOCYTESUR LARGE (A) 12/05/2022 2211   Sepsis Labs: (procalcitonin:4,lacticidven:4)  ) Recent Results (from the past 240 hour(s))  Urine Culture     Status: Abnormal   Collection Time: 12/12/2022 10:11 PM   Specimen: Urine, Catheterized  Result Value Ref Range Status   Specimen Description  URINE, CATHETERIZED  Final   Special Requests   Final    NONE Reflexed from [id number] Performed at [facility], [address] [place], [place] [address]    Culture MULTIPLE SPECIES PRESENT, SUGGEST RECOLLECTION (A)  Final   Report Status 11/30/2022 FINAL  Final     Radiology Studies: No results found.   Scheduled Meds:  (feeding supplement) PROSource Plus  30 mL Oral TID BM   enoxaparin (LOVENOX) injection  80 mg Subcutaneous BID   feeding supplement  237 mL Oral BID BM   ipratropium-albuterol  3 mL Nebulization BID   lidocaine  1 patch Transdermal Q24H   sodium chloride flush  3 mL Intravenous Q12H   Continuous Infusions:  sodium chloride     sodium chloride Stopped (12/01/22 0429)   ampicillin-sulbactam (UNASYN) IV 3 g (12/05/22 1334)   potassium chloride 10 mEq  (12/05/22 1332)     LOS: 6 days    Time spent:    Zannie Cove, MD [facility]   12/05/2022, 2:22 PM

## 2022-12-05 NOTE — TOC Progression Note (Signed)
Transition of Care [facility]) - Progression Note    Patient Details  Name: Heidi Dominguez MRN: [id number] Date of Birth: [birth year]  Transition of [facility]) CM/SW Contact  Leone Haven, RN Phone Number: 12/05/2022, 3:21 PM  Clinical Narrative:    NCM contacted daughter [name], was informed by CSW that she wanted to speak with this NCM , daughter states no one has explained to her how Hospice works, this NCM informed her that if patient goes home with hospice then she will not have HH services , she can go home on oxygen , she states that she was told something different by Palliative.  NCM informed her will have a Hospice rep from [facility] call her to explain Hospice details to her so she can understand better, she states ok to have them call her tomorrow.  NCM contacted Dionicio Stall with [facility] and she will call daughter tomorrow.         Expected Discharge Plan and Services                                               Social Determinants of Health (SDOH) Interventions SDOH Screenings   Food Insecurity: No Food Insecurity (11/30/2022)  Housing: Low Risk  (11/30/2022)  Transportation Needs: No Transportation Needs (11/30/2022)  Utilities: Not At Risk (11/30/2022)  Depression (PHQ2-9): Low Risk  (02/09/2021)  Tobacco Use: Low Risk  (11/29/2022)    Readmission Risk Interventions     No data to display

## 2022-12-05 NOTE — Progress Notes (Signed)
Speech Language Pathology Patient Details Name: Heidi Dominguez MRN: [id number] DOB: June 16, 1921 Today's Date: 12/05/2022   SLP in secure Epic chat with medical care team regarding PO's. Patient's daughter has been requesting potential advance in diet (from full liquids) to allow her things such as mashed potatoes. MD not advising this as doing so unless she is made full comfort, as this could increase chances of respiratory distress. SLP in agreement with this as patient's main hindrance with PO's does continue to be her respiratory status. SLP will plan to attempt f/u next 1-2 dates.  Angela Nevin, MA, CCC-SLP Speech Therapy

## 2022-12-05 NOTE — Progress Notes (Signed)
Per family request, all monitor sounds paused because telemetry alarms were "scaring her mother".  Pt family requested for monitor to stay on as she would like to be able to still see telemetry.

## 2022-12-05 NOTE — Progress Notes (Signed)
Progress Note   Patient: Heidi Dominguez EAV:[id number] DOB: 01-27-1921 DOA: 11/29/2022     6 DOS: the patient was seen and examined on 12/05/2022   Brief hospital course: Heidi Dominguez was admitted to the hospital with the working diagnosis of heart failure exacerbation.   89 yo female with the past medical history of severe aortic stenosis, hyponatremia, T2DM, pulmonary embolism, asthma, large hiatal hernia, and obesity who presented with altered mentation. Most of the information from her daughter at the bedside, patient has been weak, and lethargic, with nausea, and vomiting that prompted her to bring her mother to the ED.  This is her 3rd hospitalization this year and 4th visit to the ED. Last hospitalization from 11/17/22 to 11/22/23, due to volume overload, and pulmonary embolism (acute small branch posterior right lobe). On her initial physical examination her blood pressure was 160/72, HR 88, RR 21 to 34, and 02 saturation 98%, lungs with rhonchi and rales, heart with S1 and S2 present and rhythmic, positive systolic murmur at the base, severe JVD, abdomen with no distention and positive lower extremity edema.   Na 121, K 5,0 Cl 81, glucose 291, bun 15 cr 0,50   Wbc 11,4 hgb 10,0 plt 351 Urine analysis with SG 1,011, 100 protein, large leukocytes, 11-20.   Head CT with no acute changes.   Chest radiograph with cardiomegaly with bilateral hilar vascular congestion, bilateral interstitial infiltrates more confluent left lower lobe  Follow up chest radiograph with persistent vascular congestion with worsening infiltrate at the left lower lobe.  Had opacity left lower lobe in the past, she is known to have large hiatal h hernia. Chest CT personally reviewed.   EKG 89 bpm, normal axis, normal intervals, sinus rhythm with no significant ST segment or T wave changes.   Patient was placed on furosemide and Bipap  04/19 Patient over night had persistent respiratory failure and hypotension,  shock physiology. Critical care was consulted and patient's daughter was updated.  Family decided to avoid ICU transfer and not perform aggressive invasive interventions, or vasopressors.    This morning patient continue unresponsive and hypotensive, she has bipap in place.  I spoke with her daughter POA, and she understands that her mother is having multiorgan failure with poor prognosis. She is aware that death is imminent despite the current aggressive non invasive therapies.  She is requesting no more blood work. We talk about changing to full comfort care today. She will call back on final decision about changing to full comfort care.   04/20 patient is more reactive today, but continue very weak and deconditioned. Her daughter has requested to continue IV antibiotics.   04/21: Verbally interacting with family; overall weak and deconditioned.  Seen by speech therapy with recommendation for full liquid diet.  Family in agreement to advance diet with good understanding regarding risk for aspiration.  Still with signs of fluid below on exam. 04/22 patient very weak and deconditioned, opens her eyes to light touch, not interactive.   04/23 patient with decreased 02 requirements, today down to 8 L.min per HFNC.   I spoke with her daughter Heidi Dominguez) and her son at the bedside.  Plan is to continue medical therapy during her hospitalization and discharge to home with home hospice as plan A, if not possible due to more higher level of care plan B is residential hospice.   Assessment and Plan: Acute on chronic diastolic CHF (congestive heart failure) -Echocardiogram with preserved LV systolic function EF 60 to  65%, moderate LVH, mild RV enlargement, RV systolic function with mild reduction, LA with severe dilatation, RA normal size, small pericardial effusion.  Severe aortic stenosis.  End stage heart failure.  Patient with improved volume status. Will continue IV furosemide as needed during her  hospitalization. Patient with hypokalemia.  Continue very weak and deconditioned, not able to tolerate po due to high aspiration risk.   Continue current IV antibiotics for aspiration coverage pneumonia, complete course of 5 days with Unasyn.   Very poor prognosis. Plan to continue medical therapy during her hospitalization and transition to home with hospice or residential hospice.   Acute hypoxemic and hypercapnic respiratory failure. Patient required heated high flow nasal cannula and intermittent Bipap. Today able to decrease to 8 L/min per HFNC. Very poor prognosis.  Patient with high risk of aspiration.  Considering her high 02 requirements may need residential hospice.   CKD (chronic kidney disease) stage 3, GFR 30-59 ml/min -Hyponatremia, improved with diuresis Hypokalemia,   Renal function with serum cr at 0,60 with K at 2,5 and serum bicarbonate at >45,  Na 140, BUN 6.   Holding furosemide for today. Add Kcl IV to correct hypokalemia.  Limited blood work, patient with poor prognosis. Check BMP tomorrow  Essential hypertension Systolic blood pressure 150s mmHg.    Pulmonary embolism -Continue enoxaparin for anticoagulation.   Diabetes mellitus type 2, controlled Holding on insulin therapy and check capillary glucose only as needed.  Patient with very poor oral intake.  Fasting glucose today 172   COPD (chronic obstructive pulmonary disease) -Supportive bipap for now.  -continue bronchodilator management.  Acute metabolic encephalopathy -Multifactorial  -prognosis is guarded. -continue current treatment and follow clinical response.   Class 1 obesity -Body mass index is 30.46 kg/m. -feeding supplements per nutritional service once proven oral tolerance.        Subjective: Patient very weak and deconditioned, minimally responsive to touch and voice. Not following commands. Her daughter and son are at the baseline   Physical Exam: Vitals:    12/05/22 0945 12/05/22 0950 12/05/22 1039 12/05/22 1045  BP:      Pulse: 80     Resp: 20     Temp:      TempSrc:      SpO2: 96% 100% 100% 100%  Weight:      Height:       Neurology patient very weak and deconditioned, not following commands, poorly responsive, opens her eyes to touch. ENT with pallor Cardiovascular with S1 and S2 present and regular, positive systolic murmur at the base No JVD Trace lower extremity edema Respiratory with rales and rhonchi, poor inspiratory effort and increased work of breathing Abdomen with no distention  Data Reviewed:    Family Communication:  I spoke with patient's daughter and son at the bedside, we talked in detail about patient's condition, plan of care and prognosis and all questions were addressed.   Disposition: Status is: Inpatient Remains inpatient appropriate because: heart failure, end of life care   Planned Discharge Destination:  to be determined      Author: Coralie Keens, MD 12/05/2022 12:50 PM  For on call review [email or web address].

## 2022-12-05 NOTE — Progress Notes (Addendum)
Her  Potassium  is 2.5 this morning Notified Dr. Margo Aye with 6 runs of Potassium. Will continue to monitor.

## 2022-12-05 NOTE — Progress Notes (Signed)
Initial Nutrition Assessment  DOCUMENTATION CODES:   Not applicable  INTERVENTION:  Ensure Enlive po BID, each supplement provides 350 kcal and 20 grams of protein. Magic cup TID with meals, each supplement provides 290 kcal and 9 grams of protein 30 ml ProSource Plus TID, each supplement provides 100 kcals and 15 grams protein.   NUTRITION DIAGNOSIS:   Inadequate oral intake related to acute illness as evidenced by other (comment) (AMS and full liquid diet).  GOAL:   Patient will meet greater than or equal to 90% of their needs  MONITOR:   PO intake, Supplement acceptance, Labs, Weight trends, Diet advancement, Skin  REASON FOR ASSESSMENT:   Rounds    ASSESSMENT:   Pt admitted with altered mentation. PMH significant for chronic hyponatremia, HFpEF, severe aortic stenosis, dysphagia, asthma, normocytic anemia, T2DM, GERD with large hiatal hernia, recurrent nausea and vomiting, recently admitted for R should PJI and PE, with several recurrent admission since 09/2022.   PMT following for end stage HF d/t aortic stenosis. Family wishing for continues treatment, however no intubation or tube feeding.   Continues medical management for acute hypoxemic and hypercapnic respiratory failure, COPD, and acute metabolic encephalopathy.   ST following. Continues to recommend full liquid diet 2/2 respiratory deficits.   Spoke with pt's daughter at bedside. She reports pt had been eating at her baseline up until admission. They were feeding her solid foods but very finely chopped. Family is wishful for pt to be able to start eating some solids again prior to discharge. Explained to her that ST would continue to follow and assess for ability to advance. She was also inquiring about mashed potatoes, however explained that these are more of a pureed consistency versus full liquids. Reached out to ST to update them on family's concerns and questions.   Pt's daughter requesting addition of  protein containing supplements while pt is on full liquids to increase nutritional adequacy.   Noted magic cup and ensure on bedside table however only bites and sips taken as pt has been more lethargic and sleepy today.   Reviewed documented weight history. Pt's weight was noted to have increased from 77.7 kg on 2/14 up to 86.5 kg on 03/22 which is likely r/t to volume status. Pt's weight is now down to 75.2 kg.  Edema: generalized mild pitting BLE  Medications: lovenox, IV abx, IV KCl   Labs: potassium 2.5, Cl 81, CO2 >45, BUN 6   NUTRITION - FOCUSED PHYSICAL EXAM: Pt comfortable wrapped up and sleeping. Deferred to follow up.   Diet Order:   Diet Order             Diet full liquid Room service appropriate? Yes; Fluid consistency: Thin  Diet effective now                   EDUCATION NEEDS:   Education needs have been addressed  Skin:  Skin Assessment: Skin Integrity Issues: Skin Integrity Issues:: Stage II Stage II: R buttock, sacrum, buttock/vaginal  Last BM:  4/21  Height:   Ht Readings from Last 1 Encounters:  11/30/22  (1.626 m)    Weight:   Wt Readings from Last 1 Encounters:  12/05/22 75.2 kg   BMI:  Body mass index is 28.46 kg/m.  Estimated Nutritional Needs:   Kcal:  1500-1700  Protein:  95-105g  Fluid:  >/=1.5L Drusilla Kanner, RDN, LDN Clinical Nutrition

## 2022-12-06 DIAGNOSIS — I509 Heart failure, unspecified: Secondary | ICD-10-CM | POA: Diagnosis not present

## 2022-12-06 LAB — BASIC METABOLIC PANEL
BUN: 7 mg/dL — ABNORMAL LOW (ref 8–23)
CO2: >45 mmol/L — ABNORMAL HIGH (ref 22–32)
Calcium: 8.8 mg/dL — ABNORMAL LOW (ref 8.9–10.3)
Chloride: 82 mmol/L — ABNORMAL LOW (ref 98–111)
Creatinine, Ser: 0.67 mg/dL (ref 0.44–1.00)
GFR, Estimated: >60 mL/min (ref >60)
Glucose, Bld: 170 mg/dL — ABNORMAL HIGH (ref 70–99)
Potassium: 3.4 mmol/L — ABNORMAL LOW (ref 3.5–5.1)
Sodium: 140 mmol/L (ref 135–145)

## 2022-12-06 LAB — MAGNESIUM: Magnesium: 1.8 mg/dL (ref 1.7–2.4)

## 2022-12-06 LAB — GLUCOSE, CAPILLARY: Glucose-Capillary: 122 mg/dL — ABNORMAL HIGH (ref 70–99)

## 2022-12-06 MED ORDER — FUROSEMIDE 10 MG/ML IJ SOLN
40.0000 mg | Freq: Once | INTRAMUSCULAR | Status: AC
  Administered 2022-12-06: 40 mg via INTRAVENOUS
  Filled 2022-12-06: qty 4

## 2022-12-06 MED ORDER — GUAIFENESIN-DM 100-10 MG/5ML PO SYRP
5.0000 mL | ORAL_SOLUTION | ORAL | Status: DC | PRN
  Administered 2022-12-06: 5 mL via ORAL
  Filled 2022-12-06: qty 5

## 2022-12-06 MED ORDER — ACETAZOLAMIDE 250 MG PO TABS
125.0000 mg | ORAL_TABLET | Freq: Two times a day (BID) | ORAL | Status: DC
  Administered 2022-12-06: 125 mg via ORAL
  Filled 2022-12-06 (×4): qty 1

## 2022-12-06 MED ORDER — APIXABAN 5 MG PO TABS
5.0000 mg | ORAL_TABLET | Freq: Two times a day (BID) | ORAL | Status: DC
  Administered 2022-12-06: 5 mg via ORAL
  Filled 2022-12-06 (×4): qty 1

## 2022-12-06 MED ORDER — ACETAZOLAMIDE SODIUM 500 MG IJ SOLR
125.0000 mg | Freq: Two times a day (BID) | INTRAMUSCULAR | Status: DC
  Administered 2022-12-07 – 2022-12-08 (×5): 125 mg via INTRAVENOUS
  Filled 2022-12-06 (×7): qty 125

## 2022-12-06 MED ORDER — POLYVINYL ALCOHOL 1.4 % OP SOLN: 1.0000 [drp] | OPHTHALMIC | Status: DC | PRN

## 2022-12-06 MED ORDER — POTASSIUM CHLORIDE CRYS ER 20 MEQ PO TBCR
40.0000 meq | EXTENDED_RELEASE_TABLET | Freq: Once | ORAL | Status: AC
  Administered 2022-12-06: 40 meq via ORAL
  Filled 2022-12-06: qty 2

## 2022-12-06 MED ORDER — CEFADROXIL 500 MG PO CAPS
1000.0000 mg | ORAL_CAPSULE | Freq: Two times a day (BID) | ORAL | Status: DC
  Administered 2022-12-06: 1000 mg via ORAL
  Filled 2022-12-06 (×3): qty 2

## 2022-12-06 MED ORDER — SODIUM CHLORIDE 0.9 % IV SOLN
3.0000 g | Freq: Four times a day (QID) | INTRAVENOUS | Status: DC
  Filled 2022-12-06 (×2): qty 8

## 2022-12-06 MED ORDER — SODIUM CHLORIDE 0.9 % IV SOLN
3.0000 g | Freq: Three times a day (TID) | INTRAVENOUS | Status: DC
  Administered 2022-12-07 – 2022-12-08 (×6): 3 g via INTRAVENOUS
  Filled 2022-12-06 (×8): qty 8

## 2022-12-06 NOTE — Progress Notes (Signed)
Speech Language Pathology Treatment: Dysphagia  Patient Details Name: PALYN SCRIMA MRN: [id number] DOB: February 10, 1921 Today's Date: 12/06/2022 Time: 4098-1191 SLP Time Calculation (min) (ACUTE ONLY): 15 min  Assessment / Plan / Recommendation Clinical Impression  Patient seen by SLP for skilled treatment focused on dysphagia goals. RN in room as well as a couple family members. RN giving patient her medications, crushed in pudding and SLP providing thin liquids (juice). Patient required cues to continue with meds due to not liking flavor and attempting to spit some out. She did exhibit mildly delayed coughing consistent with her h/o esophageal dysphagia. RR in range of mid 20's to high 30's. MD has already advised to not upgrade beyond current PO diet of full liquids. SLP in agreement as patient is not on full comfort, has h/o esophageal dysphagia (stricture as well as dysmotility) and continues to have high RR. SLP to s/o at this time. If patient were to transition to full comfort, would recommend not advancing more than purees (preferably smooth, creamy purees) as this will likely be tolerated by patient the best. Solids that require mastication, are dry, tough, chewy, dense will likely result in increased coughing and increase her risk of choking. SLP to s/o at this time.    HPI HPI: 89 y.o. female known to SLP services and evaluated for dysphagia on 2/16, 3/15, and 4/8.  Presented to ED with N/V and progressive weakness and lethargy. Concerns for her severe aortic stenosis, decompensated HF, respiratory distress and high risk for worsening.  PMHx significant for chronic hyponatremia, HFpEF, severe aortic stenosis, esophageal dysphagia, asthma, normocytic anemia, T2DM, GERD with large hiatal hernia and recurrent nausea and vomiting, HTN, HLD, recent admission for right shoulder PJI, and PE. Patient has had several recurrent admissions since February 2024.  Each with different diagnoses including RSV  infection, right shoulder shoulder PJI, pulmonary embolism, each admission with underlying hyponatremia. She has a hx of eosphageal dysphagia. Most recent MBS 07/20/22: oral and oropharyngeal function WNL. Esophagram on 11/12/22: "Focal narrowing of the distal esophagus compatible with  stricture, similar to the prior upper GI series 06/20/2017. It is estimated that the esophagus is narrowed to 5-7 mm at this site.  3. Mildly patulous esophagus elsewhere.  4. Prominent esophageal dysmotility with tertiary contractions."  A regular diet, thin liquids have been recommended after each recent evaluation. Pt has been independent with esophageal precautions. Patient evaluated at bedside  on 11/30/22 and as she was obtunded and unable to manage secretions or participate in PO trials. SLP service ordered again on 4/20 for re-evaluation as daughter wants to see if there is any PO patient could safely consume.      SLP Plan  Continue with current plan of care      Recommendations for follow up therapy are one component of a multi-disciplinary discharge planning process, led by the attending physician.  Recommendations may be updated based on patient status, additional functional criteria and insurance authorization.    Recommendations  Diet recommendations: Thin liquid;Other(comment) (full liquids) Liquids provided via: Straw Medication Administration: Crushed with puree Supervision: Full supervision/cueing for compensatory strategies;Trained caregiver to feed patient;Staff to assist with self feeding Compensations: Slow rate;Small sips/bites Postural Changes and/or Swallow Maneuvers: Seated upright 90 degrees                  Oral care BID;Staff/trained caregiver to provide oral care   Frequent or constant Supervision/Assistance Dysphagia, unspecified (R13.10)     Continue with current plan of care  Sonia Baller, MA, CCC-SLP Speech Therapy

## 2022-12-06 NOTE — TOC Progression Note (Signed)
Transition of Care [facility]) - Progression Note    Patient Details  Name: Heidi Dominguez MRN: [id number] Date of Birth: 12-18-1920  [facility] of [facility]) CM/SW Contact  Leone Haven, RN Phone Number: 12/06/2022, 3:29 PM  Clinical Narrative:    Dionicio Stall with [facility] to meet with daughter at 9:30 tomorrow to talk about Palliative and Hospice GOC .        Expected Discharge Plan and Services                                               Social Determinants of Health (SDOH) Interventions SDOH Screenings   Food Insecurity: No Food Insecurity (11/30/2022)  Housing: Low Risk  (11/30/2022)  Transportation Needs: No Transportation Needs (11/30/2022)  Utilities: Not At Risk (11/30/2022)  Depression (PHQ2-9): Low Risk  (02/09/2021)  Tobacco Use: Low Risk  (11/29/2022)    Readmission Risk Interventions     No data to display

## 2022-12-07 DIAGNOSIS — I509 Heart failure, unspecified: Secondary | ICD-10-CM | POA: Diagnosis not present

## 2022-12-07 DIAGNOSIS — Z515 Encounter for palliative care: Secondary | ICD-10-CM | POA: Diagnosis not present

## 2022-12-07 DIAGNOSIS — Z66 Do not resuscitate: Secondary | ICD-10-CM | POA: Diagnosis not present

## 2022-12-07 DIAGNOSIS — Z7189 Other specified counseling: Secondary | ICD-10-CM | POA: Diagnosis not present

## 2022-12-07 LAB — CBC
HCT: 35.2 % — ABNORMAL LOW (ref 36.0–46.0)
Hemoglobin: 9.7 g/dL — ABNORMAL LOW (ref 12.0–15.0)
MCH: 26.6 pg (ref 26.0–34.0)
MCHC: 27.6 g/dL — ABNORMAL LOW (ref 30.0–36.0)
MCV: 96.7 fL (ref 80.0–100.0)
Platelets: 195 10*3/uL (ref 150–400)
RBC: 3.64 MIL/uL — ABNORMAL LOW (ref 3.87–5.11)
RDW: 17.5 % — ABNORMAL HIGH (ref 11.5–15.5)
WBC: 5.6 10*3/uL (ref 4.0–10.5)
nRBC: 0 % (ref 0.0–0.2)

## 2022-12-07 LAB — BASIC METABOLIC PANEL
BUN: 10 mg/dL (ref 8–23)
CO2: >45 mmol/L — ABNORMAL HIGH (ref 22–32)
Calcium: 9.5 mg/dL (ref 8.9–10.3)
Chloride: 85 mmol/L — ABNORMAL LOW (ref 98–111)
Creatinine, Ser: 0.62 mg/dL (ref 0.44–1.00)
GFR, Estimated: >60 mL/min (ref >60)
Glucose, Bld: 170 mg/dL — ABNORMAL HIGH (ref 70–99)
Potassium: 3.7 mmol/L (ref 3.5–5.1)
Sodium: 145 mmol/L (ref 135–145)

## 2022-12-07 MED ORDER — ALBUTEROL SULFATE (2.5 MG/3ML) 0.083% IN NEBU
INHALATION_SOLUTION | RESPIRATORY_TRACT | Status: AC
  Administered 2022-12-07: 2.5 mg
  Filled 2022-12-07: qty 3

## 2022-12-07 MED ORDER — ALBUTEROL SULFATE (2.5 MG/3ML) 0.083% IN NEBU: 2.5000 mg | INHALATION_SOLUTION | RESPIRATORY_TRACT | Status: DC | PRN

## 2022-12-07 MED ORDER — ENOXAPARIN SODIUM 40 MG/0.4ML IJ SOSY
40.0000 mg | PREFILLED_SYRINGE | Freq: Once | INTRAMUSCULAR | Status: AC
  Administered 2022-12-07: 40 mg via SUBCUTANEOUS
  Filled 2022-12-07: qty 0.4

## 2022-12-07 MED ORDER — ACETAMINOPHEN 650 MG RE SUPP
975.0000 mg | Freq: Once | RECTAL | Status: AC
  Administered 2022-12-07: 975 mg via RECTAL
  Filled 2022-12-07: qty 2

## 2022-12-07 MED ORDER — FUROSEMIDE 10 MG/ML IJ SOLN
40.0000 mg | Freq: Once | INTRAMUSCULAR | Status: AC
  Administered 2022-12-07: 40 mg via INTRAVENOUS
  Filled 2022-12-07: qty 4

## 2022-12-07 NOTE — Progress Notes (Signed)
Daily Progress Note   Patient Name: Heidi Dominguez       Date: 12/07/2022 DOB: 14-Nov-1920  Age: 89 y.o. MRN#: [id number] Attending Physician: Zannie Cove, MD Primary Care Physician: Merlene Laughter, MD Admit Date: 11/29/2022 Length of Stay: 8 days  Reason for Consultation/Follow-up: Establishing goals of care  HPI/Patient Profile:  89 y.o. female with past medical history of HFpEF, severe aortic stenosis, T2DM, pulmonary embolism, asthma, large hiatal hernia, and hyponatremia who presented to [facility] ED on 11/29/2022 with altered mental status. She was admitted with acute on chronic diastolic CHF, volume overload, and acute respiratory failure. Palliative Medicine was consulted for goals of care.   Subjective:   Subjective: Chart Reviewed. Updates received. Patient Assessed. Created space and opportunity for patient  and family to explore thoughts and feelings regarding current medical situation.  Today's Discussion: Today saw the patient at the bedside and her daughter Heidi Dominguez was present.  At her daughter's request we went to a conference room on the fourth floor for further discussion.  Prior to meeting with the patient's family I received an update from Dr. Jomarie Longs and nursing.  The patient began having dyspnea overnight and was increased to 50% FiO2.  Speech therapist also discussed her dysphagia issues and notes that her "esophagus is just worn out" and not transporting food to the stomach which is causing it to stack up and spillover.  She is anticipated to have respiratory issues and coughing 30 to 45 minutes after eating, based on this pathology.  We had an extensive discussion with the patient's daughter.  [facility] collective liaison Heidi Dominguez was also present as was psychiatry resident Dr.Nguyen.  She expressed concerns about consideration of hospice.  She is concerned about not being able to have a pure wick, concerned about 24-hour home care, concern about possibly not having Lasix  available as part of her hospice medications.  She needed agreed to check with their medical director about coverage of Lasix.  We also discussed workaround's about possibly being able to have some surgery.  Device including the patient's family ordering supplies and the possibility of hospice providing the suction equipment.  Hospice social worker will also attempt to get a list of home health aide agencies for the patient's daughter.  The patient's daughter agrees that she needs time today to think about all of this while we are trying to get answers back to her.  Anterior it seems she is agreeable to hospice, if some of these capabilities can be met.  She would prefer to go home with hospice, although she recognizes the difficulty in providing 24-hour care.  However, there are some things at home that her mother is very partial to that she would want her to have as she approaches end-of-life.  In general feeling from hospice is that she would likely qualify for residential hospice as well.  There is also possibility of going home with hospice and then transitioning to residential hospice when care becomes too much to handle.  We agreed to meet again tomorrow between 9 and 930 for further discussion and possible decision-making.  I provided emotional and general support through therapeutic listening, empathy, sharing of stories, and other techniques. I answered all questions and addressed all concerns to the best of my ability.  Review of Systems  Unable to perform ROS: Acuity of condition    Objective:   Vital Signs:  BP (!) 138/57 (BP Location: Left Arm)   Pulse 86   Temp 98.7 F (37.1 C) (  Axillary)   Resp (!) 39   Ht  (1.626 m)   Wt 73.8 kg   SpO2 98%   BMI 27.93 kg/m   Physical Exam: Physical Exam Vitals and nursing note reviewed.  HENT:     Head: Normocephalic and atraumatic.  Cardiovascular:     Rate and Rhythm: Normal rate.  Pulmonary:     Effort: Pulmonary effort is  normal. No respiratory distress.  Abdominal:     General: Abdomen is flat.  Skin:    General: Skin is warm and dry.  Neurological:     Mental Status: She is alert.     Palliative Assessment/Data: 20-30%    Existing Vynca/ACP Documentation: MOST form signed 04/13/2021  Assessment & Plan:   Impression: Present on Admission:  Acute on chronic diastolic CHF (congestive heart failure)  CKD (chronic kidney disease) stage 3, GFR 30-59 ml/min  COPD (chronic obstructive pulmonary disease)  Essential hypertension  Pulmonary embolism  SUMMARY OF RECOMMENDATIONS   Remain DNR Attempt to get answers from hospice about Lasix, pure wick suction device, local home health agencies Allow time today for the patient's family to think and consider options PMT will follow-up with patient's family tomorrow morning for further discussion PMT will continue to follow  Symptom Management:  Per primary team PMT is available to assist as needed  Code Status: DNR  Prognosis: Unable to determine  Discharge Planning: To Be Determined  Discussed with: Patient's family, medical team, nursing team, [facility] team, hospice liaison  Thank you for allowing [place] to participate in the care of [name] PMT will continue to support holistically.  Time Total: 95 min  Visit consisted of counseling and education dealing with the complex and emotionally intense issues of symptom management and palliative care in the setting of serious and potentially life-threatening illness. Greater than 50%  of this time was spent counseling and coordinating care related to the above assessment and plan.  Wynne Dust, NP Palliative Medicine Team  Team Phone # [phone] (Nights/Weekends)  04/12/2021, 8:17 AM

## 2022-12-07 NOTE — Evaluation (Signed)
Clinical/Bedside Swallow Evaluation Patient Details  Name: Heidi Dominguez MRN: [id number] Date of Birth: Jul 10, 1921  Today's Date: 12/07/2022 Time: SLP Start Time (ACUTE ONLY): 1458 SLP Stop Time (ACUTE ONLY): 1507 SLP Time Calculation (min) (ACUTE ONLY): 9 min  Past Medical History:  Past Medical History:  Diagnosis Date   Acute pancreatitis    hx of    Arthritis    "qwhere" (07/04/2018)   Chronic back pain    "all over" (07/04/2018)   Chronic diastolic CHF (congestive heart failure)    Chronic kidney disease    stage  3 chronic kidney disease    Complication of anesthesia    "I have a hard time waking up"   Degenerative joint disease of shoulder region    Dry eye syndrome    Dyspnea    Family history of adverse reaction to anesthesia    "daughter has the shakes when she wakes up"   GERD (gastroesophageal reflux disease)    Heart murmur    High cholesterol    History of hiatal hernia    HOH (hard of hearing)    Hypertension    Impacted cerumen of both ears    hx of    Muscle weakness (generalized)    Osteoarthritis    Overactive bladder    Pancreatitis    hx of    Phlebitis    "BLE"   Rotator cuff tear    right    Tear of right supraspinatus tendon    Thrombocytopenia    hx of    Type II diabetes mellitus    Urinary tract infection    hx of    UTI (urinary tract infection) 09/12/2018   Xerosis cutis    hx of    Past Surgical History:  Past Surgical History:  Procedure Laterality Date   ABDOMINAL AORTOGRAM W/LOWER EXTREMITY N/A 12/22/2020   Procedure: ABDOMINAL AORTOGRAM W/LOWER EXTREMITY;  Surgeon: Leonie Douglas, MD;  Location: [facility];  Service: Cardiovascular;  Laterality: N/A;   ABDOMINAL HYSTERECTOMY     BACK SURGERY     CATARACT EXTRACTION W/ INTRAOCULAR LENS  IMPLANT, BILATERAL Bilateral    CHOLECYSTECTOMY  2016   ERCP N/A 08/30/2018   Procedure: ENDOSCOPIC RETROGRADE CHOLANGIOPANCREATOGRAPHY (ERCP);  Surgeon: Vida Rigger, MD;   Location: [facility];  Service: Endoscopy;  Laterality: N/A;   FIXATION KYPHOPLASTY     INCISION AND DRAINAGE Right 11/08/2018   Procedure: INCISION AND DRAINAGE right shoulder, placement of antibiotic beads ;  Surgeon: Beverely Low, MD;  Location: [facility];  Service: Orthopedics;  Laterality: Right;   IR US GUIDE BX ASP/DRAIN  11/04/2022   IR US GUIDE BX ASP/DRAIN  11/04/2022   JOINT REPLACEMENT     LAPAROSCOPIC CHOLECYSTECTOMY SINGLE SITE WITH INTRAOPERATIVE CHOLANGIOGRAM N/A 04/11/2015   Procedure: LAPAROSCOPIC LYSIS OF ADHESIONS, LAPAROSCOPIC CHOLECYSTECTOMY WITH INTRAOPERATIVE CHOLANGIOGRAM;  Surgeon: Karie Soda, MD;  Location: [facility];  Service: General;  Laterality: N/A;   PANCREATIC STENT PLACEMENT  08/30/2018   Procedure: PANCREATIC STENT PLACEMENT;  Surgeon: Vida Rigger, MD;  Location: [facility];  Service: Endoscopy;;   PERIPHERAL VASCULAR INTERVENTION  12/22/2020   Procedure: PERIPHERAL VASCULAR INTERVENTION;  Surgeon: Leonie Douglas, MD;  Location: [facility];  Service: Cardiovascular;;  left anterior tibial artery   REMOVAL OF STONES  08/30/2018   Procedure: REMOVAL OF STONES;  Surgeon: Vida Rigger, MD;  Location: [facility];  Service: Endoscopy;;   SHOULDER OPEN ROTATOR CUFF REPAIR Bilateral  SPHINCTEROTOMY  08/30/2018   Procedure: SPHINCTEROTOMY;  Surgeon: Vida Rigger, MD;  Location: [facility];  Service: Endoscopy;;   TOTAL KNEE ARTHROPLASTY Bilateral    HPI:  89 y.o. female known to SLP services and evaluated for dysphagia on 2/16, 3/15, and 4/8.  Presented to ED with N/V and progressive weakness and lethargy. Concerns for her severe aortic stenosis, decompensated HF, respiratory distress and high risk for worsening.  PMHx significant for chronic hyponatremia, HFpEF, severe aortic stenosis, esophageal dysphagia, asthma, normocytic anemia, T2DM, GERD with large hiatal hernia and recurrent nausea and vomiting, HTN, HLD, recent admission for right shoulder PJI, and PE.  Patient has had several recurrent admissions since February 2024.  Each with different diagnoses including RSV infection, right shoulder shoulder PJI, pulmonary embolism, each admission with underlying hyponatremia. She has a hx of eosphageal dysphagia. Most recent MBS 07/20/22: oral and oropharyngeal function WNL. Esophagram on 11/12/22: "Focal narrowing of the distal esophagus compatible with  stricture, similar to the prior upper GI series 06/20/2017. It is estimated that the esophagus is narrowed to 5-7 mm at this site.  3. Mildly patulous esophagus elsewhere.  4. Prominent esophageal dysmotility with tertiary contractions."  A regular diet, thin liquids have been recommended after each recent evaluation. Pt has been independent with esophageal precautions. Patient evaluated at bedside  on 11/30/22 and as she was obtunded and unable to manage secretions or participate in PO trials. SLP service ordered again on 4/20 for re-evaluation as daughter wants to see if there is any PO patient could safely consume.  SLP reordered 4/25. Overnight with worsening hypoxia and encephalopathy.    Assessment / Plan / Recommendation  Clinical Impression  Pt's swallow function is consistent with prior assessments. Spoke with pt's daughter, Heidi Dominguez, earlier today when she was preparing for a meeting with Palliative Care. Followed up this afternoon to see pt. Heidi Dominguez had gone home for the day and pt's caregiver was at the bedside. This am, Heidi Dominguez and I spoke about her mother's esophageal dysfunction and how it continues to impact her ability to eat; likewise, there is ongoing likely aspiration of contents of her esophagus.  This afternoon upon return, Heidi Dominguez awakened, smiled, accepted several sips of water from a straw with no overt difficulty nor complaints. Tray was at bedside; caregiver reported only minimal intake. Keeping her on a full liquid diet or soft/moist purees is really the best option for her at this time.  Recommend allowing her to eat/drink according to her desires.  I don't recommend f/u SLP. Palliative support is the best option. Our service will respectfully sign off. SLP Visit Diagnosis: Dysphagia, unspecified (R13.10)    Aspiration Risk  Moderate aspiration risk    Diet Recommendation   Full liquids  Medication Administration: Crushed with puree    Other  Recommendations Oral Care Recommendations: Oral care BID    Recommendations for follow up therapy are one component of a multi-disciplinary discharge planning process, led by the attending physician.  Recommendations may be updated based on patient status, additional functional criteria and insurance authorization.  Follow up Recommendations No SLP follow up                          Prognosis Prognosis for improved oropharyngeal function: Guarded      Swallow Study   General Date of Onset: 12/02/22 HPI: 89 y.o. female known to SLP services and evaluated for dysphagia on 2/16, 3/15, and 4/8.  Presented to ED  with N/V and progressive weakness and lethargy. Concerns for her severe aortic stenosis, decompensated HF, respiratory distress and high risk for worsening.  PMHx significant for chronic hyponatremia, HFpEF, severe aortic stenosis, esophageal dysphagia, asthma, normocytic anemia, T2DM, GERD with large hiatal hernia and recurrent nausea and vomiting, HTN, HLD, recent admission for right shoulder PJI, and PE. Patient has had several recurrent admissions since February 2024.  Each with different diagnoses including RSV infection, right shoulder shoulder PJI, pulmonary embolism, each admission with underlying hyponatremia. She has a hx of eosphageal dysphagia. Most recent MBS 07/20/22: oral and oropharyngeal function WNL. Esophagram on 11/12/22: "Focal narrowing of the distal esophagus compatible with  stricture, similar to the prior upper GI series 06/20/2017. It is estimated that the esophagus is narrowed to 5-7 mm at this site.   3. Mildly patulous esophagus elsewhere.  4. Prominent esophageal dysmotility with tertiary contractions."  A regular diet, thin liquids have been recommended after each recent evaluation. Pt has been independent with esophageal precautions. Patient evaluated at bedside  on 11/30/22 and as she was obtunded and unable to manage secretions or participate in PO trials. SLP service ordered again on 4/20 for re-evaluation as daughter wants to see if there is any PO patient could safely consume.  SLP reordered 4/25. Overnight with worsening hypoxia and encephalopathy. Type of Study: Bedside Swallow Evaluation Previous Swallow Assessment: see HPI Diet Prior to this Study: Full liquid diet Temperature Spikes Noted: No Respiratory Status:  (HFNC) History of Recent Intubation: No Behavior/Cognition: Alert;Cooperative Oral Cavity Assessment: Within Functional Limits Oral Care Completed by SLP: Recent completion by staff Oral Cavity - Dentition: Edentulous Self-Feeding Abilities: Needs assist Patient Positioning: Upright in bed Baseline Vocal Quality: Low vocal intensity Volitional Cough: Cognitively unable to elicit Volitional Swallow: Unable to elicit    Oral/Motor/Sensory Function Overall Oral Motor/Sensory Function: Within functional limits   Ice Chips Ice chips: Not tested   Thin Liquid Thin Liquid: Within functional limits Presentation: Straw    Nectar Thick Nectar Thick Liquid: Not tested   Honey Thick Honey Thick Liquid: Not tested   Puree Puree: Not tested   Solid     Solid: Not tested      Blenda Mounts Laurice 12/07/2022,4:18 PM   Marchelle Folks L. Samson Frederic, MA CCC/SLP Clinical Specialist - Acute Care SLP Acute Rehabilitation Services Office number [phone]

## 2022-12-07 NOTE — Progress Notes (Signed)
PROGRESS NOTE    Heidi Dominguez  UEA:[id number] DOB: [birth year] DOA: 11/17/2022 PCP: Merlene Laughter, MD  89/F w Severe aortic stenosis, hyponatremia, T2DM, pulmonary embolism, asthma, large hiatal hernia, and obesity who presented with altered mentation, weakness, lethargy, nausea and vomiting.  This is her 3rd hospitalization this year and 4th visit to the ED. Last hospitalization from 4/5-4/10 with volume overload and pulmonary embolism . -In the ER she was tachypneic, and volume overloaded, labs noted creatinine of 0.5, white count 11.4, abnormal UA. -CT head unremarkable, chest x-ray with cardiomegaly pulmonary vascular congestion and bilateral interstitial infiltrates. - placed on furosemide and Bipap -4/19 Patient over night had persistent respiratory failure and hypotension, shock physiology. Critical care was consulted and patient's daughter was updated.  Family decided to avoid ICU transfer and not perform aggressive invasive interventions, or vasopressors.  -unresponsive and hypotensive, Rx w/ BIPAP -MD d/w daughter POA, re: poor prognosis. -4/20 patient is more interactive, but continues to be very weak and deconditioned. Her daughter has requested to continueRx -4/21: Verbally interacting with family; overall weak and deconditioned.  Seen by speech therapy with recommendation for full liquid diet.  Family in agreement to advance diet with good understanding regarding risk for aspiration.  Still with signs of fluid  -4/22 patient very weak and deconditioned -4/23 patient with decreased 02 requirements, down to 8 L.min per HFNC.  -Dr.Arrien d.w daughter (POA) and her son at the bedside.  Plan is to continue medical therapy during her hospitalization and discharge to home with home hospice vs residential hospice.  -4/25, overnight with worsening hypoxia and encephalopathy   Subjective: -Overnight more congested and rhonchorous, worsening oxygen requirement  Assessment and  Plan:  Acute on chronic diastolic CHF Severe Aortic stenosis -Echo EF 60 to 65%, moderate LVH, mild RV enlargement, RV systolic function with mild reduction, LA with severe dilatation, Severe aortic stenosis.  -Diuresed with IV Lasix this admission, urine output has been inaccurate, weight down 21lb, hold further IV Lasix today -Conservative management only with advanced age and frailty, -she is not a candidate for TAVR or valve replacement -Poor prognosis has been discussed with patient's daughter on multiple occasions, recommended hospice services and comfort focused care, palliative care team following for another meeting today   Acute hypoxemic and hypercapnic respiratory failure. Aspiration pneumonia -heated high flow nasal cannula and intermittent Bipap. -Was down to 4 L nasal cannula yesterday, back on heated high flow this morning -Completed for 5-day course of IV Unasyn 4/24 -Suspect recurrent aspiration and it is an ongoing issue, has esophageal dysphagia and severe debility, discussed with daughter again and strongly recommended comfort focused care, hospice, palliative care following  CKD (chronic kidney disease) stage 3, GFR 30-59 ml/min -Hyponatremia, improved with diuresis -Stable  Pulmonary embolism -Resumed apixaban  Diabetes mellitus type 2, controlled -Stable  COPD (chronic obstructive pulmonary disease) -Supportive bipap for now.  -continue bronchodilator management.  Acute metabolic encephalopathy -Multifactorial  -prognosis is guarded. -continue current treatment and follow clinical response.   Class 1 obesity -Body mass index is 30.46 kg/m.   DVT prophylaxis: Apixaban Code Status: DNR Family Communication: Daughter at bedside Disposition Plan: Possibly home with hospice in 1 to 2 days versus residential hospice  Consultants: Palliative care   Procedures:   Antimicrobials:    Objective: Vitals:   12/07/22 0018 12/07/22 0024 12/07/22 0324  12/07/22 0455  BP: (!) 140/67 (!) 140/67  (!) 138/57  Pulse: 85 85  86  Resp: (!) 36 (!) 36  Marland Kitchen)  39  Temp: 99.8 F (37.7 C) 99.8 F (37.7 C)  98.7 F (37.1 C)  TempSrc:  Axillary  Axillary  SpO2:  98% 98% 98%  Weight:    73.8 kg  Height:        Intake/Output Summary (Last 24 hours) at 12/07/2022 1225 Last data filed at 12/06/2022 1942 Gross per 24 hour  Intake --  Output 1401 ml  Net -1401 ml   Filed Weights   12/05/22 0500 12/06/22 0234 12/07/22 0455  Weight: 75.2 kg 74.7 kg 73.8 kg    Examination:  Frail elderly female laying in bed, somnolent but arousable on heated high flow HEENT: Positive JVD CVS: S1-S2, regular rhythm, systolic murmur Lungs: Decreased breath sounds at the bases Abdomen: Soft, nontender, bowel sounds present Extremities: Trace edema     Data Reviewed:   CBC: Recent Labs  Lab 12/01/22 0632 12/07/22 0759  WBC 20.1* 5.6  NEUTROABS 19.3*  --   HGB 9.6* 9.7*  HCT 32.3* 35.2*  MCV 90.7 96.7  PLT 171 195   Basic Metabolic Panel: Recent Labs  Lab 12/01/22 0632 12/03/22 0102 12/05/22 0417 12/06/22 0035 12/07/22 0759  NA 129* 135 140 140 145  K 3.8 2.8* 2.5* 3.4* 3.7  CL 83* 85* 81* 82* 85*  CO2 32 39* >45* >45* >45*  GLUCOSE 161* 157* 172* 170* 170*  BUN 16 18 6* 7* 10  CREATININE 0.71 0.77 0.60 0.67 0.62  CALCIUM 9.3 8.8* 8.9 8.8* 9.5  MG 2.0  --   --  1.8  --   PHOS 4.9*  --   --   --   --    GFR: Estimated Creatinine Clearance: 37.7 mL/min (by C-G formula based on SCr of 0.62 mg/dL). Liver Function Tests: Recent Labs  Lab 12/01/22 0632  AST 21  ALT 19  ALKPHOS 71  BILITOT 0.5  PROT 6.8  ALBUMIN 2.6*   No results for input(s): "LIPASE", "AMYLASE" in the last 168 hours. No results for input(s): "AMMONIA" in the last 168 hours. Coagulation Profile: No results for input(s): "INR", "PROTIME" in the last 168 hours. Cardiac Enzymes: No results for input(s): "CKTOTAL", "CKMB", "CKMBINDEX", "TROPONINI" in the last 168  hours. BNP (last 3 results) Recent Labs    09/27/22 1218  PROBNP 77.0   HbA1C: No results for input(s): "HGBA1C" in the last 72 hours. CBG: Recent Labs  Lab 12/03/22 1754 12/03/22 2358 12/04/22 0610 12/04/22 1126 12/06/22 0624  GLUCAP 222* 206* 161* 167* 122*   Lipid Profile: No results for input(s): "CHOL", "HDL", "LDLCALC", "TRIG", "CHOLHDL", "LDLDIRECT" in the last 72 hours. Thyroid Function Tests: No results for input(s): "TSH", "T4TOTAL", "FREET4", "T3FREE", "THYROIDAB" in the last 72 hours. Anemia Panel: No results for input(s): "VITAMINB12", "FOLATE", "FERRITIN", "TIBC", "IRON", "RETICCTPCT" in the last 72 hours. Urine analysis:    Component Value Date/Time   COLORURINE YELLOW 12/06/2022 2211   APPEARANCEUR HAZY (A) 11/18/2022 2211   LABSPEC 1.011 11/27/2022 2211   PHURINE 6.0 12/06/2022 2211   GLUCOSEU NEGATIVE 11/16/2022 2211   HGBUR SMALL (A) 11/28/2022 2211   BILIRUBINUR NEGATIVE 12/12/2022 2211   KETONESUR NEGATIVE 11/30/2022 2211   PROTEINUR 100 (A) 11/19/2022 2211   UROBILINOGEN 1.0 04/07/2015 2312   NITRITE NEGATIVE 11/25/2022 2211   LEUKOCYTESUR LARGE (A) 11/18/2022 2211   Sepsis Labs: (procalcitonin:4,lacticidven:4)  ) Recent Results (from the past 240 hour(s))  Urine Culture     Status: Abnormal   Collection Time: 12/10/2022 10:11 PM   Specimen: Urine, Catheterized  Result Value Ref Range Status   Specimen Description URINE, CATHETERIZED  Final   Special Requests   Final    NONE Reflexed from [id number] Performed at [facility], [address] [place], [place] [address]    Culture MULTIPLE SPECIES PRESENT, SUGGEST RECOLLECTION (A)  Final   Report Status 11/30/2022 FINAL  Final     Radiology Studies: No results found.   Scheduled Meds:  (feeding supplement) PROSource Plus  30 mL Oral TID BM   acetaZOLAMIDE  125 mg Intravenous Q12H   acetaZOLAMIDE  125 mg Oral BID   apixaban  5 mg Oral BID   feeding supplement  237 mL  Oral BID BM   ipratropium-albuterol  3 mL Nebulization BID   lidocaine  1 patch Transdermal Q24H   sodium chloride flush  3 mL Intravenous Q12H   Continuous Infusions:  sodium chloride     sodium chloride Stopped (12/01/22 0429)   ampicillin-sulbactam (UNASYN) IV 3 g (12/07/22 0600)     LOS: 8 days    Time spent:    Zannie Cove, MD [facility]   12/07/2022, 12:25 PM

## 2022-12-07 NOTE — Progress Notes (Signed)
[employer], [employer] [facility]) Hospital Liaison Note  Information session held with patient's daughter [name] and Minerva Areola, NP from [facility].   Please call with any questions or concerns. Thank you  Dionicio Stall, [facility] Liaison  [phone]

## 2022-12-08 DIAGNOSIS — Z66 Do not resuscitate: Secondary | ICD-10-CM | POA: Diagnosis not present

## 2022-12-08 DIAGNOSIS — I509 Heart failure, unspecified: Secondary | ICD-10-CM | POA: Diagnosis not present

## 2022-12-08 DIAGNOSIS — Z7189 Other specified counseling: Secondary | ICD-10-CM | POA: Diagnosis not present

## 2022-12-08 DIAGNOSIS — Z515 Encounter for palliative care: Secondary | ICD-10-CM | POA: Diagnosis not present

## 2022-12-08 LAB — BASIC METABOLIC PANEL
BUN: 13 mg/dL (ref 8–23)
CO2: >45 mmol/L — ABNORMAL HIGH (ref 22–32)
Calcium: 9.3 mg/dL (ref 8.9–10.3)
Chloride: 86 mmol/L — ABNORMAL LOW (ref 98–111)
Creatinine, Ser: 0.63 mg/dL (ref 0.44–1.00)
GFR, Estimated: >60 mL/min (ref >60)
Glucose, Bld: 168 mg/dL — ABNORMAL HIGH (ref 70–99)
Potassium: 3.1 mmol/L — ABNORMAL LOW (ref 3.5–5.1)
Sodium: 145 mmol/L (ref 135–145)

## 2022-12-08 LAB — CBC
HCT: 33.8 % — ABNORMAL LOW (ref 36.0–46.0)
Hemoglobin: 9.3 g/dL — ABNORMAL LOW (ref 12.0–15.0)
MCH: 27 pg (ref 26.0–34.0)
MCHC: 27.5 g/dL — ABNORMAL LOW (ref 30.0–36.0)
MCV: 98 fL (ref 80.0–100.0)
Platelets: 176 10*3/uL (ref 150–400)
RBC: 3.45 MIL/uL — ABNORMAL LOW (ref 3.87–5.11)
RDW: 17.6 % — ABNORMAL HIGH (ref 11.5–15.5)
WBC: 5.7 10*3/uL (ref 4.0–10.5)
nRBC: 0 % (ref 0.0–0.2)

## 2022-12-08 MED ORDER — BACITRACIN-NEOMYCIN-POLYMYXIN OINTMENT TUBE
TOPICAL_OINTMENT | Freq: Every day | CUTANEOUS | Status: DC
  Administered 2022-12-08: 1 via TOPICAL
  Filled 2022-12-08: qty 14

## 2022-12-08 MED ORDER — ORAL CARE MOUTH RINSE
15.0000 mL | OROMUCOSAL | Status: DC
  Administered 2022-12-08 (×2): 15 mL via OROMUCOSAL

## 2022-12-08 MED ORDER — ORAL CARE MOUTH RINSE: 15.0000 mL | OROMUCOSAL | Status: DC | PRN

## 2022-12-08 NOTE — Progress Notes (Addendum)
PROGRESS NOTE    Heidi Dominguez  ZOX:[id number] DOB: Sep 29, 1920 DOA: 11/22/2022 PCP: Merlene Laughter, MD  89/F w Severe aortic stenosis, hyponatremia, T2DM, pulmonary embolism, asthma, large hiatal hernia, and obesity who presented with altered mentation, weakness, lethargy, nausea and vomiting.  This is her 3rd hospitalization this year and 4th visit to the ED. Last hospitalization from 4/5-4/10 with volume overload and pulmonary embolism . -In the ER she was tachypneic, and volume overloaded, labs noted creatinine of 0.5, white count 11.4, abnormal UA. -CT head unremarkable, chest x-ray with cardiomegaly pulmonary vascular congestion and bilateral interstitial infiltrates. - placed on furosemide and Bipap -4/19 Patient over night had persistent respiratory failure and hypotension, shock physiology. Critical care was consulted and patient's daughter was updated.  Family decided to avoid ICU transfer and not perform aggressive invasive interventions, or vasopressors.  -unresponsive and hypotensive, Rx w/ BIPAP -MD d/w daughter POA, re: poor prognosis. -4/20 patient is more interactive, but continues to be very weak and deconditioned. Her daughter has requested to continueRx -4/21: Verbally interacting with family; overall weak and deconditioned.  Seen by speech therapy with recommendation for full liquid diet.  Family in agreement to advance diet with good understanding regarding risk for aspiration.  Still with signs of fluid  -4/22 patient very weak and deconditioned -4/23 patient with decreased 02 requirements, down to 8 L.min per HFNC.  -Dr.Arrien d.w daughter (POA) and her son at the bedside.  Plan is to continue medical therapy during her hospitalization and discharge to home with home hospice vs residential hospice.  -4/25, overnight with worsening hypoxia and encephalopathy -4/25, 4/26, further palliative discussions ongoing, recommended again several times   Subjective: -More  obtunded today, on 30 L high flow  Assessment and Plan:  Acute on chronic diastolic CHF Severe Aortic stenosis -Echo EF 60 to 65%, moderate LVH, mild RV enlargement, RV systolic function with mild reduction, LA with severe dilatation, Severe aortic stenosis.  -Diuresed with IV Lasix this admission, urine output has been inaccurate, weight down 21lb, hold further IV Lasix today -Conservative management only with advanced age and frailty, -she is not a candidate for TAVR or valve replacement -Poor prognosis has been discussed with patient's daughter on multiple occasions, recommended hospice services and comfort focused care, palliative care team following, anticipate need for residential hospice, hospital demise also highly likely  Acute hypoxemic and hypercapnic respiratory failure. Aspiration pneumonia -heated high flow nasal cannula and intermittent Bipap. -Was down to 4 L nasal cannula yesterday, back on heated high flow this morning -Completed for 7-day course of IV Unasyn -Suspect recurrent aspiration and it is an ongoing issue, has esophageal dysphagia and severe debility, discussed with daughter again and strongly recommended comfort focused care, hospice, palliative care following  CKD (chronic kidney disease) stage 3, GFR 30-59 ml/min -Hyponatremia, improved with diuresis -Stable  Pulmonary embolism -Resumed apixaban  Diabetes mellitus type 2, controlled -Stable  COPD (chronic obstructive pulmonary disease) -Supportive bipap for now.  -continue bronchodilator management.  Acute metabolic encephalopathy -Multifactorial  -prognosis is guarded. -continue current treatment and follow clinical response.   Class 1 obesity -Body mass index is 30.46 kg/m.   DVT prophylaxis: Apixaban Code Status: DNR Family Communication: Daughter at bedside Disposition Plan: Residential hospice versus anticipated hospital death  Consultants: Palliative care   Procedures:    Antimicrobials:    Objective: Vitals:   12/08/22 0505 12/08/22 0726 12/08/22 1033 12/08/22 1100  BP: (!) 144/65 (!) 126/58    Pulse: 97 91  98  Resp: (!) 29 (!) 35    Temp: 98.9 F (37.2 C) 98.3 F (36.8 C)    TempSrc: Oral     SpO2: 95% 97% 98%   Weight: 73.9 kg     Height:        Intake/Output Summary (Last 24 hours) at 12/08/2022 1400 Last data filed at 12/08/2022 0506 Gross per 24 hour  Intake 400.17 ml  Output 950 ml  Net -549.83 ml   Filed Weights   12/06/22 0234 12/07/22 0455 12/08/22 0505  Weight: 74.7 kg 73.8 kg 73.9 kg    Examination:  Frail chronically ill female, obtunded, partly opens eyes, unable to follow commands HEENT: Positive JVD CVS: S1-S2, regular rhythm Lungs: Decreased breath sounds to bases Abdomen: Soft, nontender, bowel sounds present Extremities:1plus edema   Data Reviewed:   CBC: Recent Labs  Lab 12/07/22 0759 12/08/22 0101  WBC 5.6 5.7  HGB 9.7* 9.3*  HCT 35.2* 33.8*  MCV 96.7 98.0  PLT 195 176   Basic Metabolic Panel: Recent Labs  Lab 12/03/22 0102 12/05/22 0417 12/06/22 0035 12/07/22 0759 12/08/22 0101  NA 135 140 140 145 145  K 2.8* 2.5* 3.4* 3.7 3.1*  CL 85* 81* 82* 85* 86*  CO2 39* >45* >45* >45* >45*  GLUCOSE 157* 172* 170* 170* 168*  BUN 18 6* 7* 10 13  CREATININE 0.77 0.60 0.67 0.62 0.63  CALCIUM 8.8* 8.9 8.8* 9.5 9.3  MG  --   --  1.8  --   --    GFR: Estimated Creatinine Clearance: 37.8 mL/min (by C-G formula based on SCr of 0.63 mg/dL). Liver Function Tests: No results for input(s): "AST", "ALT", "ALKPHOS", "BILITOT", "PROT", "ALBUMIN" in the last 168 hours.  No results for input(s): "LIPASE", "AMYLASE" in the last 168 hours. No results for input(s): "AMMONIA" in the last 168 hours. Coagulation Profile: No results for input(s): "INR", "PROTIME" in the last 168 hours. Cardiac Enzymes: No results for input(s): "CKTOTAL", "CKMB", "CKMBINDEX", "TROPONINI" in the last 168 hours. BNP (last 3  results) Recent Labs    09/27/22 1218  PROBNP 77.0   HbA1C: No results for input(s): "HGBA1C" in the last 72 hours. CBG: Recent Labs  Lab 12/03/22 1754 12/03/22 2358 12/04/22 0610 12/04/22 1126 12/06/22 0624  GLUCAP 222* 206* 161* 167* 122*   Lipid Profile: No results for input(s): "CHOL", "HDL", "LDLCALC", "TRIG", "CHOLHDL", "LDLDIRECT" in the last 72 hours. Thyroid Function Tests: No results for input(s): "TSH", "T4TOTAL", "FREET4", "T3FREE", "THYROIDAB" in the last 72 hours. Anemia Panel: No results for input(s): "VITAMINB12", "FOLATE", "FERRITIN", "TIBC", "IRON", "RETICCTPCT" in the last 72 hours. Urine analysis:    Component Value Date/Time   COLORURINE YELLOW 12/06/2022 2211   APPEARANCEUR HAZY (A) 11/28/2022 2211   LABSPEC 1.011 11/15/2022 2211   PHURINE 6.0 11/18/2022 2211   GLUCOSEU NEGATIVE 12/05/2022 2211   HGBUR SMALL (A) 12/08/2022 2211   BILIRUBINUR NEGATIVE 12/01/2022 2211   KETONESUR NEGATIVE 11/16/2022 2211   PROTEINUR 100 (A) 11/14/2022 2211   UROBILINOGEN 1.0 04/07/2015 2312   NITRITE NEGATIVE 12/08/2022 2211   LEUKOCYTESUR LARGE (A) 12/11/2022 2211   Sepsis Labs: @LABRCNTIP (procalcitonin:4,lacticidven:4)  ) Recent Results (from the past 240 hour(s))  Urine Culture     Status: Abnormal   Collection Time: 12/07/2022 10:11 PM   Specimen: Urine, Catheterized  Result Value Ref Range Status   Specimen Description URINE, CATHETERIZED  Final   Special Requests   Final    NONE Reflexed from [id number] Performed at [facility]  Lab, [address] [place], [place] [address]    Culture MULTIPLE SPECIES PRESENT, SUGGEST RECOLLECTION (A)  Final   Report Status 11/30/2022 FINAL  Final     Radiology Studies: No results found.   Scheduled Meds:  (feeding supplement) PROSource Plus  30 mL Oral TID BM   acetaZOLAMIDE  125 mg Intravenous Q12H   acetaZOLAMIDE  125 mg Oral BID   apixaban  5 mg Oral BID   feeding supplement  237 mL Oral BID BM    ipratropium-albuterol  3 mL Nebulization BID   lidocaine  1 patch Transdermal Q24H   neomycin-bacitracin-polymyxin   Topical Daily   mouth rinse  15 mL Mouth Rinse 4 times per day   sodium chloride flush  3 mL Intravenous Q12H   Continuous Infusions:  sodium chloride     sodium chloride Stopped (12/01/22 0429)   ampicillin-sulbactam (UNASYN) IV 3 g (12/08/22 1349)     LOS: 9 days    Time spent:    Zannie Cove, MD [facility]   12/08/2022, 2:00 PM

## 2022-12-08 NOTE — Progress Notes (Signed)
Pt taken off Heated HFNC 20L 40% and placed on 8L HFNC. Pt tolerated well, vitals stable, SPO2 99%, MD notified, RT will monitor as needed.      12/08/22 1530  Therapy Vitals  Pulse Rate 80  Resp (!) 32  MEWS Score/Color  MEWS Score 3  MEWS Score Color Yellow  Oxygen Therapy/Pulse Ox  O2 Device (S)  HFNC (salter)  O2 Therapy Oxygen humidified  O2 Flow Rate (L/min) (S)  8 L/min  SpO2 99 %

## 2022-12-08 NOTE — Progress Notes (Signed)
Civil engineer, contracting [facility]) Hospital Liaison Note  [facility] liaison spoke with patient's daughter about [facility]. At this time the daughter has not made a decision and would like the weekend to think about it.   Please call with any questions or concerns. Thank you  Dionicio Stall, [facility] Liaison [phone]

## 2022-12-08 NOTE — Progress Notes (Signed)
Daily Progress Note   Patient Name: Heidi Dominguez       Date: 12/08/2022 DOB: 22-Nov-1920  Age: 89 y.o. MRN#: [id number] Attending Physician: Zannie Cove, MD Primary Care Physician: Merlene Laughter, MD Admit Date: 12/07/2022 Length of Stay: 9 days  Reason for Consultation/Follow-up: Establishing goals of care  HPI/Patient Profile:  88 y.o. female with past medical history of HFpEF, severe aortic stenosis, T2DM, pulmonary embolism, asthma, large hiatal hernia, and hyponatremia who presented to [facility] ED on 11/15/2022 with altered mental status. She was admitted with acute on chronic diastolic CHF, volume overload, and acute respiratory failure. Palliative Medicine was consulted for goals of care.   Subjective:   Subjective: Chart Reviewed. Updates received. Patient Assessed. Created space and opportunity for patient  and family to explore thoughts and feelings regarding current medical situation.  Today's Discussion: Today saw the patient at bedside.  Her daughter was present.  We had significant discussion on her current clinical situation.  She states that she did not have a very good night last night and was "time to party at 2 AM".  However, now the patient is sleepy.  Her daughter continues to give her applesauce at the bedside.  She states she has not received answers from hospice about Lasix and other questions previously mentioned in my last note.  During my visit Dr. Jomarie Longs with the hospitalist team came by.  We discussed request for Lasix if she elects hospice has an element of comfort as her primary driver of discomfort is fluid overload.  We also discussed oxygen and that is part of comfort care and hospice care we would slowly titrate down her level of oxygen and use other means such as medications, fans, etc. to help relieve dyspnea and air hunger.  She did would like her mom to remain on some oxygen however.  After discussion of lungs clear the patient is approaching end-of-life.  I  messaged and spoke with the hospice liaison.  She had a follow-up conversation with the patient's daughter and her daughter would like to take the weekend to think about her options.  Hospice plans to follow-up on Monday.  I provided emotional and general support through therapeutic listening, empathy, sharing of stories, therapeutic touch, and other techniques. I answered all questions and addressed all concerns to the best of my ability.  ROS Limited due to mental status and acuity Review of Systems  Constitutional:  Positive for fatigue.       Denies pain in general  Respiratory:  Positive for shortness of breath.   Gastrointestinal:  Negative for abdominal pain, nausea and vomiting.    Objective:   Vital Signs:  BP (!) 126/58 (BP Location: Left Arm)   Pulse 98   Temp 98.3 F (36.8 C)   Resp (!) 35   Ht 5\' 4"  (1.626 m)   Wt 73.9 kg   SpO2 98%   BMI 27.97 kg/m   Physical Exam: Physical Exam  Palliative Assessment/Data: 20-30%    Existing Vynca/ACP Documentation: MOST form signed 04/13/2021   Assessment & Plan:   Impression: Present on Admission:  Acute on chronic diastolic CHF (congestive heart failure) (HCC)  CKD (chronic kidney disease) stage 3, GFR 30-59 ml/min (HCC)  COPD (chronic obstructive pulmonary disease) (HCC)  Essential hypertension  Pulmonary embolism (HCC)  SUMMARY OF RECOMMENDATIONS   DNR Cont'd wait on answers from hospice about Lasix, pure wick suction device, local home health agencies Allow weekend for family to make decisions If elects hospice  would need to be on 16 lpm oxygen or less PMT will continue to follow  Symptom Management:  Per primary team PMT is available to assist as needed  Code Status: DNR  Prognosis: Unable to determine  Discharge Planning: To Be Determined  Discussed with: Patient's family, medical team, nursing team  Thank you for allowing [place] to participate in the care of NANETTA WIEGMAN PMT will continue to  support holistically.  Time Total: 55 min  Visit consisted of counseling and education dealing with the complex and emotionally intense issues of symptom management and palliative care in the setting of serious and potentially life-threatening illness. Greater than 50%  of this time was spent counseling and coordinating care related to the above assessment and plan.  Wynne Dust, NP Palliative Medicine Team  Team Phone # [phone] (Nights/Weekends)  04/12/2021, 8:17 AM

## 2022-12-13 NOTE — Progress Notes (Signed)
Pt family members have let the unit She has been placed in body containment bag and will be taken off the unit as per procedure to morgue IV line and purewick has been removed as well as the Nasal cannula  Pt dentures have been placed in her mouth as per family members request  Funeral home has been notified per charge nurse information that was given

## 2022-12-13 NOTE — Significant Event (Addendum)
Rapid Response Event Note   Reason for Call :  SpO2-64%  Initial Focused Assessment:  Pt lying in bed with eyes closed. Pt breathing is apneic with some occasional agonal breaths. Pt heart rhythm has wide QRS, rate 60s, no pulse felt. During assessment, HR declined to asystole. Pt is a DNR.  Pt pronounced by 2 RNs at 0127and Dr. Arville Care notified.    Interventions:  None  Plan of Care:  Time of death called at 0127. Family in room, support given and questions answered.   Event Summary:   MD Notified: Dr. Arville Care Call [phone] Arrival Time:0120 End Time:0130  Terrilyn Saver, RN

## 2022-12-13 NOTE — Progress Notes (Signed)
Patient heart rhythm changed to wide QRS and patient o2 saturations in the 70s. Patient agonal breathing.  RN to bedside. Son Edessa Jakubowicz at bedside during this time). Rapid, RN (Mindy) called to bedside. Mansy, MD called and updated. During assessment, HR declined to asystole. Mansy, MD gave verbal to pronounce. Allegra Grana, RN and Renato Shin, RN listened for heart and lung sounds for one full minute. Time of death 33. [place] called and notified.

## 2022-12-13 DEATH — deceased

## 2023-01-13 NOTE — Death Summary Note (Signed)
DEATH SUMMARY   Patient Details  Name: Heidi Dominguez MRN: [id number] DOB: 1920-09-17 WUJ:[id number], Ann Maki, MD Admission/Discharge Information   Admit Date:  2022/12/19  Date of Death: Date of Death: Dec 29, 2022  Time of Death: Time of Death: 0124  Length of Stay: 10   Principle Cause of death: Dec 29, 2022  Hospital Diagnoses: Active Problems:   Acute on chronic diastolic CHF Severe aortic stenosis Acute hypoxic respiratory failure Aspiration pneumonia COPD   CKD (chronic kidney disease) stage 3, GFR 30-59 ml/min (HCC)   Essential hypertension   Pulmonary embolism (HCC)   Diabetes mellitus type 2, controlled (HCC)   COPD (chronic obstructive pulmonary disease) (HCC)   Acute metabolic encephalopathy   Class 1 obesity   DNR (do not resuscitate)   Hospital Course:  Acute on chronic diastolic CHF Severe Aortic stenosis -Echo EF 60 to 65%, moderate LVH, mild RV enlargement, RV systolic function with mild reduction, LA with severe dilatation, Severe aortic stenosis.  -Diuresed with IV Lasix this admission, urine output has been inaccurate, weight down 21lb,  -she is not a candidate for TAVR or valve replacement -Poor prognosis has been discussed with patient's daughter on multiple occasions, recommended hospice services and comfort focused care, followed by palliative care team, subsequently expired overnight on Dec 29, 2022   Acute hypoxemic and hypercapnic respiratory failure. Aspiration pneumonia -heated high flow nasal cannula and intermittent Bipap. -Was down to 4 L nasal cannula yesterday, back on heated high flow this morning -Completed for 7-day course of IV Unasyn -Suspect recurrent aspiration and it is an ongoing issue, has esophageal dysphagia and severe debility, discussed with daughter again and recommended comfort focused care, hospice,    CKD (chronic kidney disease) stage 3, GFR 30-59 ml/min -Hyponatremia, improved with diuresis   Pulmonary embolism -on apixaban    Diabetes mellitus type 2, controlled -Stable   COPD (chronic obstructive pulmonary disease) -Supportive bipap for now.  -continue bronchodilator management.   Acute metabolic encephalopathy -Multifactorial  -prognosis is guarded.   Class 1 obesity -Body mass index is 30.46 kg/m.          The results of significant diagnostics from this hospitalization (including imaging, microbiology, ancillary and laboratory) are listed below for reference.   Significant Diagnostic Studies: DG Chest Port 1 View  Result Date: 11/30/2022 CLINICAL DATA:  Dyspnea.w EXAM: PORTABLE CHEST 1 VIEW COMPARISON:  Chest radiographs 12-19-2022, 11/17/2022 and 11/16/2022 FINDINGS: Cardiac silhouette is again mildly enlarged. Moderate calcification is again seen within aortic arch. Moderate bilateral interstitial thickening. Heterogeneous retrocardiac opacification is similar to prior. Small left pleural effusion tracking up the left lateral pleura as seen on 11/18/2022 CT. No pneumothorax. Bilateral shoulder arthroplasty hardware again noted. IMPRESSION: 1. Moderate bilateral interstitial thickening which may be due to pulmonary edema. 2. Left basilar retrocardiac opacification as seen on multiple prior radiographs and 11/18/2022 CT, atelectasis and/or pneumonia. Note is made of known large hiatal hernia better seen on prior CT. 3. Small left pleural effusion tracking up the left lateral pleura. Electronically Signed   By: Neita Garnet M.D.   On: 11/30/2022 14:16   DG Chest Portable 1 View  Result Date: 2022/12/19 CLINICAL DATA:  CHF EXAM: PORTABLE CHEST 1 VIEW COMPARISON:  CXR 11/17/22 FINDINGS: Cardiomegaly. Small right pleural effusion. Likely layering left-sided pleural effusion, unchanged from prior exam. There prominent bilateral opacities slightly improved from prior exam, likely suggestive of resolving pulmonary edema or pulmonary venous congestion. No new focal airspace opacity. Unchanged mediastinal  contours. No radiographically apparent displaced rib  fractures. Bilateral shoulder arthroplasties. IMPRESSION: 1. Small right pleural effusion. Likely layering left-sided pleural effusion, unchanged from prior exam. 2. Prominent bilateral opacities slightly improved from prior exam, likely suggestive of resolving pulmonary edema or pulmonary venous congestion. Electronically Signed   By: Lorenza Cambridge M.D.   On: December 12, 2022 17:27   CT Head Wo Contrast  Result Date: 2022-12-12 CLINICAL DATA:  Altered mental status EXAM: CT HEAD WITHOUT CONTRAST TECHNIQUE: Contiguous axial images were obtained from the base of the skull through the vertex without intravenous contrast. RADIATION DOSE REDUCTION: This exam was performed according to the departmental dose-optimization program which includes automated exposure control, adjustment of the mA and/or kV according to patient size and/or use of iterative reconstruction technique. COMPARISON:  None Available.  11/18/2022 FINDINGS: Brain: No evidence of acute infarction, hemorrhage, mass, mass effect, or midline shift. No hydrocephalus or extra-axial fluid collection. Periventricular white matter changes, likely the sequela of chronic small vessel ischemic disease. Remote lacunar infarct in the right pons Vascular: No hyperdense vessel. Atherosclerotic calcifications in the intracranial carotid and vertebral arteries. Skull: Negative for fracture or focal lesion. Sinuses/Orbits: No acute finding. Status post bilateral lens replacements. Other: The mastoid air cells are well aerated. IMPRESSION: No acute intracranial process. Electronically Signed   By: Wiliam Ke M.D.   On: December 12, 2022 17:01    Microbiology: No results found for this or any previous visit (from the past 240 hour(s)).  Time spent: 35 minutes  Signed: Zannie Cove, MD
